# Patient Record
Sex: Female | Born: 1950 | Race: Black or African American | Hispanic: No | Marital: Married | Smoking: Never smoker
Health system: Southern US, Community
[De-identification: ages and names within clinical notes are randomized; demographics above are authoritative.]

## PROBLEM LIST (undated history)

## (undated) DIAGNOSIS — E785 Hyperlipidemia, unspecified: Secondary | ICD-10-CM

## (undated) DIAGNOSIS — I10 Essential (primary) hypertension: Secondary | ICD-10-CM

## (undated) DIAGNOSIS — E119 Type 2 diabetes mellitus without complications: Secondary | ICD-10-CM

## (undated) DIAGNOSIS — C189 Malignant neoplasm of colon, unspecified: Secondary | ICD-10-CM

## (undated) NOTE — *Deleted (*Deleted)
Pharmacy Student Note: For Learning Purposes ONLY. Not an active part of the chart.   Chief Complaint:  Admitted with  COVID, subsequently A. Fib with RVR, found to have perforated duodenal ulcer. Exploratory laparatomy > hemorrhagic shock. AKI and hyperkalemia ensued. Currently intubated.  PMH: DM, HTN, HLD, colon cancer PSH: partial colectomy, colostomy  Admit Complaint:  Anticoagulation: SCDs (hemorrhagic shock)  Infectious Disease -hypothermic over night (19F) > bair hugger  -PNA Cefepime 2 g   MRSA nasal neg --> D/C'd vanc -UA > UTI --> covering for PNA    -UA: cloudy, leukcoytes > WBCs (no nitrites though)  Cardiovascular -cardizem drip and eliquis - BP 100s overnight -MAP goal > 65 -not f/o on exam -IV vasopressin and levophed  Endocrinology: DM SSI   Gastrointestinal / Nutrition: perforated ulcer -TPN > no K in TPN; no LR > enteral  -protonix  Neurology: intubated Propofol, fentanyl, midazolam infusion, Nimbex, dexmedetomidine -pH: 7.244 (acidosis) -pCO2: 44.9 -> normal (high side) -bicarb [id number] ->  Low (metabolic acidosis)   -TGs elevated (propofol) > propofol D/C'd -diphenhydramine not dialyzed on CRRT  Nephrology: oliguric ATN d/t hemorr shock Scr 5.62 10/25 > 0.9 11/9 > incr again to 1.92 after hemorrhagic shock UOP: seemingly none 11/10  BUN 51 Prismasol 2K  Electrolytes K 5.1> 6 > 4.1 ok  (prismasol 2K/2.5)) > monitor tomorrow for low K > K better > 4/2.5 bath now (and this week) K 4.4 Phos is low (2.3)  Calcium 8.6 Albumin 1.3 Corr Cal= ~9.9-10 ok Mag 2.5  Pulmonary: ARDS/COVID PNA -mech vent   Hematology / Oncology -excess bleed after surgery and Hgb dec 6.9 > hemorrhagic shock (pressors, blood transf, mech vent) -Hgb 8.5 -received transf. Last week -vasopressin 0.03 units/min, levophed 13 mcg/min with MAPS in 60-70s  Inpt meds Chlorhexidine  Feeding suppl Hydrocortisone injection 100 mg SSI 0-20 units levemir 25 units  Pantoprazole vasopressin   PTA Medication Issues Albuterol Amlodipine 10 apixaban 2.5 APAP Aspirin 81 Metformin 500 Rosuvastatin 40  Best Practices   Glean Salvo, Festus Aloe PharmD Student

## (undated) NOTE — *Deleted (*Deleted)
1932: Surgery service paged, pt has orders for free water per NG and metoprolol. Surgery no states nothing per tube, orders need clarification. Pt has hx of afib w/ RVR  1940: Dr. Brantley Stage returned page, follow up with med service regarding orders. Pt is to have nothing in the tube.

## (undated) NOTE — *Deleted (*Deleted)
Pharmacy Resident Rounding Note - for learning purposes only, not an active part of the chart   Admit Complaint: COVID+   - perforated ulcer: s/p lysis of adhesions, graham patch placed >> bleeding post-op, tx to ICU for hemorrhagic shock  Anticoagulation - holding AC, final eval = no DVT, on SCDs Infectious Disease - meth R staph epi 1/4 bottles - day 6 Vanc 1g q24h [12mg /kg/d] -tx trough on 11/27 = 17  Cardiovascular  - EF 70-75% NE @ 13>>15 Endocrinology - CBGs <180, levemir 20 QD + 8u q4 + SSI Gastrointestinal / Nutrition - ppi, on TF  Neurology Nephrology - AKI 2/2 hemorrhagic shock, started CRRT on 11/24 for hyperkalemia of 6.8. started on 2/2.5 >> 4/2.5  Aranesp 40 >>60 mcg IV qTues for anemia of AKI * Outpatient ESA/iron orders: none  * Last Tsat 15% and ferritin 1,638 on 11/22 - Feraheme 510mg  x1 on 11/23 * Last doses of Aranesp given on 11/23 * Hgb 8 on 11/29, next dose due 11/30  Phos low 2.3  - replacing -12mmol given 11/28, up to 3.7 >> 2.3 on 11/29  4.5-4.7L UF / day  Pulmonary - extubated then re-intubated 11/16, fever on 11/24 so resumed abx Hematology / Oncology Hgb 9.9 >> 8 Tx 11/26 PTA Medication Issues Best Practices - last BM 11/29   AKI on CKD - cont CRRT 4/2.5 bath - RIJ cath removed, left placed 11/17 - needs TDC - kphos x1 -- K 5.9>>3.9, phos 2.3 >>6.9>> 2.2  Perforated ulcer, anemia  COVID / sepsis  - resp req coming down, FiO2 70 >> 50  - f/u abx stop date

---

## 2006-06-18 ENCOUNTER — Emergency Department (HOSPITAL_COMMUNITY): Admission: EM | Admit: 2006-06-18 | Discharge: 2006-06-19 | Payer: Self-pay | Admitting: Emergency Medicine

## 2015-08-17 ENCOUNTER — Other Ambulatory Visit: Payer: Self-pay | Admitting: Endocrinology

## 2015-08-17 DIAGNOSIS — E041 Nontoxic single thyroid nodule: Secondary | ICD-10-CM

## 2015-08-31 ENCOUNTER — Ambulatory Visit
Admission: RE | Admit: 2015-08-31 | Discharge: 2015-08-31 | Disposition: A | Discharge status: Home / Self Care | Payer: Medicare Other | Source: Ambulatory Visit | Attending: Endocrinology | Admitting: Endocrinology

## 2015-08-31 ENCOUNTER — Other Ambulatory Visit (HOSPITAL_COMMUNITY)
Admission: RE | Admit: 2015-08-31 | Discharge: 2015-08-31 | Disposition: A | Discharge status: Home / Self Care | Payer: Medicare Other | Source: Ambulatory Visit | Attending: Diagnostic Radiology | Admitting: Diagnostic Radiology

## 2015-08-31 DIAGNOSIS — E041 Nontoxic single thyroid nodule: Secondary | ICD-10-CM

## 2020-08-23 ENCOUNTER — Encounter (HOSPITAL_COMMUNITY): Payer: Self-pay | Admitting: Internal Medicine

## 2020-08-23 ENCOUNTER — Inpatient Hospital Stay (HOSPITAL_COMMUNITY)
Admission: EM | Admit: 2020-08-23 | Discharge: 2020-08-29 | DRG: 177 | Disposition: A | Discharge status: Home Health | Payer: Medicare Other | Attending: Internal Medicine | Admitting: Internal Medicine

## 2020-08-23 ENCOUNTER — Emergency Department (HOSPITAL_COMMUNITY): Payer: Medicare Other

## 2020-08-23 ENCOUNTER — Other Ambulatory Visit: Payer: Self-pay

## 2020-08-23 DIAGNOSIS — E119 Type 2 diabetes mellitus without complications: Secondary | ICD-10-CM | POA: Diagnosis present

## 2020-08-23 DIAGNOSIS — R7989 Other specified abnormal findings of blood chemistry: Secondary | ICD-10-CM | POA: Diagnosis present

## 2020-08-23 DIAGNOSIS — I959 Hypotension, unspecified: Secondary | ICD-10-CM | POA: Diagnosis present

## 2020-08-23 DIAGNOSIS — K219 Gastro-esophageal reflux disease without esophagitis: Secondary | ICD-10-CM | POA: Diagnosis present

## 2020-08-23 DIAGNOSIS — J1282 Pneumonia due to coronavirus disease 2019: Secondary | ICD-10-CM | POA: Diagnosis present

## 2020-08-23 DIAGNOSIS — Z8249 Family history of ischemic heart disease and other diseases of the circulatory system: Secondary | ICD-10-CM

## 2020-08-23 DIAGNOSIS — E782 Mixed hyperlipidemia: Secondary | ICD-10-CM | POA: Diagnosis present

## 2020-08-23 DIAGNOSIS — E0520 Thyrotoxicosis with toxic multinodular goiter without thyrotoxic crisis or storm: Secondary | ICD-10-CM | POA: Diagnosis present

## 2020-08-23 DIAGNOSIS — N179 Acute kidney failure, unspecified: Principal | ICD-10-CM

## 2020-08-23 DIAGNOSIS — R0602 Shortness of breath: Secondary | ICD-10-CM

## 2020-08-23 DIAGNOSIS — Z902 Acquired absence of lung [part of]: Secondary | ICD-10-CM

## 2020-08-23 DIAGNOSIS — Z79899 Other long term (current) drug therapy: Secondary | ICD-10-CM | POA: Diagnosis not present

## 2020-08-23 DIAGNOSIS — E872 Acidosis: Secondary | ICD-10-CM | POA: Diagnosis present

## 2020-08-23 DIAGNOSIS — Z7982 Long term (current) use of aspirin: Secondary | ICD-10-CM

## 2020-08-23 DIAGNOSIS — Z85118 Personal history of other malignant neoplasm of bronchus and lung: Secondary | ICD-10-CM | POA: Diagnosis not present

## 2020-08-23 DIAGNOSIS — Z7984 Long term (current) use of oral hypoglycemic drugs: Secondary | ICD-10-CM | POA: Diagnosis not present

## 2020-08-23 DIAGNOSIS — E871 Hypo-osmolality and hyponatremia: Secondary | ICD-10-CM | POA: Diagnosis present

## 2020-08-23 DIAGNOSIS — Z85048 Personal history of other malignant neoplasm of rectum, rectosigmoid junction, and anus: Secondary | ICD-10-CM

## 2020-08-23 DIAGNOSIS — Z2809 Immunization not carried out because of other contraindication: Secondary | ICD-10-CM

## 2020-08-23 DIAGNOSIS — U071 COVID-19: Principal | ICD-10-CM | POA: Diagnosis present

## 2020-08-23 DIAGNOSIS — J9601 Acute respiratory failure with hypoxia: Secondary | ICD-10-CM | POA: Diagnosis present

## 2020-08-23 DIAGNOSIS — I10 Essential (primary) hypertension: Secondary | ICD-10-CM | POA: Diagnosis present

## 2020-08-23 DIAGNOSIS — E860 Dehydration: Secondary | ICD-10-CM | POA: Diagnosis present

## 2020-08-23 HISTORY — DX: Hyperlipidemia, unspecified: E78.5

## 2020-08-23 HISTORY — DX: Essential (primary) hypertension: I10

## 2020-08-23 HISTORY — DX: Type 2 diabetes mellitus without complications: E11.9

## 2020-08-23 LAB — CBC WITH DIFFERENTIAL/PLATELET
Abs Immature Granulocytes: 0.04 10*3/uL (ref 0.00–0.07)
Basophils Absolute: 0 10*3/uL (ref 0.0–0.1)
Basophils Relative: 0 %
Eosinophils Absolute: 0 10*3/uL (ref 0.0–0.5)
Eosinophils Relative: 0 %
HCT: 47.5 % — ABNORMAL HIGH (ref 36.0–46.0)
Hemoglobin: 14.5 g/dL (ref 12.0–15.0)
Immature Granulocytes: 1 %
Lymphocytes Relative: 8 %
Lymphs Abs: 0.5 10*3/uL — ABNORMAL LOW (ref 0.7–4.0)
MCH: 22.6 pg — ABNORMAL LOW (ref 26.0–34.0)
MCHC: 30.5 g/dL (ref 30.0–36.0)
MCV: 73.9 fL — ABNORMAL LOW (ref 80.0–100.0)
Monocytes Absolute: 0.9 10*3/uL (ref 0.1–1.0)
Monocytes Relative: 16 %
Neutro Abs: 4.4 10*3/uL (ref 1.7–7.7)
Neutrophils Relative %: 75 %
Platelets: 261 10*3/uL (ref 150–400)
RBC: 6.43 MIL/uL — ABNORMAL HIGH (ref 3.87–5.11)
RDW: 16.1 % — ABNORMAL HIGH (ref 11.5–15.5)
WBC: 5.9 10*3/uL (ref 4.0–10.5)
nRBC: 0 % (ref 0.0–0.2)

## 2020-08-23 LAB — COMPREHENSIVE METABOLIC PANEL
ALT: 26 U/L (ref 0–44)
AST: 51 U/L — ABNORMAL HIGH (ref 15–41)
Albumin: 3.6 g/dL (ref 3.5–5.0)
Alkaline Phosphatase: 66 U/L (ref 38–126)
Anion gap: 19 — ABNORMAL HIGH (ref 5–15)
BUN: 70 mg/dL — ABNORMAL HIGH (ref 8–23)
CO2: 18 mmol/L — ABNORMAL LOW (ref 22–32)
Calcium: 10.2 mg/dL (ref 8.9–10.3)
Chloride: 94 mmol/L — ABNORMAL LOW (ref 98–111)
Creatinine, Ser: 5.62 mg/dL — ABNORMAL HIGH (ref 0.44–1.00)
GFR, Estimated: 8 mL/min — ABNORMAL LOW (ref >60)
Glucose, Bld: 171 mg/dL — ABNORMAL HIGH (ref 70–99)
Potassium: 4.4 mmol/L (ref 3.5–5.1)
Sodium: 131 mmol/L — ABNORMAL LOW (ref 135–145)
Total Bilirubin: 0.7 mg/dL (ref 0.3–1.2)
Total Protein: 8.6 g/dL — ABNORMAL HIGH (ref 6.5–8.1)

## 2020-08-23 LAB — D-DIMER, QUANTITATIVE: D-Dimer, Quant: 1.44 ug/mL-FEU — ABNORMAL HIGH (ref 0.00–0.50)

## 2020-08-23 LAB — RESP PANEL BY RT PCR (RSV, FLU A&B, COVID)
Influenza A by PCR: NEGATIVE
Influenza B by PCR: NEGATIVE
Respiratory Syncytial Virus by PCR: NEGATIVE
SARS Coronavirus 2 by RT PCR: POSITIVE — AB

## 2020-08-23 LAB — LACTATE DEHYDROGENASE: LDH: 325 U/L — ABNORMAL HIGH (ref 98–192)

## 2020-08-23 LAB — LACTIC ACID, PLASMA
Lactic Acid, Venous: 3.3 mmol/L (ref 0.5–1.9)
Lactic Acid, Venous: 1.1 mmol/L (ref 0.5–1.9)

## 2020-08-23 LAB — TRIGLYCERIDES: Triglycerides: 239 mg/dL — ABNORMAL HIGH (ref <150)

## 2020-08-23 LAB — FIBRINOGEN: Fibrinogen: 755 mg/dL — ABNORMAL HIGH (ref 210–475)

## 2020-08-23 LAB — CBG MONITORING, ED: Glucose-Capillary: 127 mg/dL — ABNORMAL HIGH (ref 70–99)

## 2020-08-23 LAB — PROCALCITONIN: Procalcitonin: 4.83 ng/mL

## 2020-08-23 LAB — GLUCOSE, CAPILLARY: Glucose-Capillary: 220 mg/dL — ABNORMAL HIGH (ref 70–99)

## 2020-08-23 MED ORDER — SODIUM CHLORIDE 0.9 % IV BOLUS
1000.0000 mL | Freq: Once | INTRAVENOUS | Status: AC
  Administered 2020-08-23: 1000 mL via INTRAVENOUS

## 2020-08-23 MED ORDER — ROSUVASTATIN CALCIUM 20 MG PO TABS
40.0000 mg | ORAL_TABLET | Freq: Every day | ORAL | Status: DC
  Administered 2020-08-23 – 2020-08-29 (×7): 40 mg via ORAL
  Filled 2020-08-23: qty 8
  Filled 2020-08-23 (×6): qty 2

## 2020-08-23 MED ORDER — ACETAMINOPHEN 500 MG PO TABS: 1000.0000 mg | ORAL_TABLET | Freq: Four times a day (QID) | ORAL | Status: DC | PRN

## 2020-08-23 MED ORDER — HEPARIN SODIUM (PORCINE) 5000 UNIT/ML IJ SOLN
5000.0000 [IU] | Freq: Three times a day (TID) | INTRAMUSCULAR | Status: DC
  Administered 2020-08-23 – 2020-08-25 (×6): 5000 [IU] via SUBCUTANEOUS
  Filled 2020-08-23 (×6): qty 1

## 2020-08-23 MED ORDER — ONDANSETRON HCL 4 MG/2ML IJ SOLN: 4.0000 mg | Freq: Four times a day (QID) | INTRAMUSCULAR | Status: DC | PRN

## 2020-08-23 MED ORDER — ALBUTEROL SULFATE HFA 108 (90 BASE) MCG/ACT IN AERS
2.0000 | INHALATION_SPRAY | RESPIRATORY_TRACT | Status: DC | PRN
  Filled 2020-08-23: qty 6.7

## 2020-08-23 MED ORDER — ONDANSETRON HCL 4 MG PO TABS: 4.0000 mg | ORAL_TABLET | Freq: Four times a day (QID) | ORAL | Status: DC | PRN

## 2020-08-23 MED ORDER — SODIUM CHLORIDE 0.9 % IV SOLN
100.0000 mg | Freq: Every day | INTRAVENOUS | Status: AC
  Administered 2020-08-24 – 2020-08-27 (×4): 100 mg via INTRAVENOUS
  Filled 2020-08-23 (×4): qty 20

## 2020-08-23 MED ORDER — SODIUM CHLORIDE 0.9 % IV SOLN
200.0000 mg | Freq: Once | INTRAVENOUS | Status: AC
  Administered 2020-08-23: 200 mg via INTRAVENOUS
  Filled 2020-08-23: qty 40

## 2020-08-23 MED ORDER — METHYLPREDNISOLONE SODIUM SUCC 125 MG IJ SOLR
0.5000 mg/kg | Freq: Two times a day (BID) | INTRAMUSCULAR | Status: DC
  Administered 2020-08-23 – 2020-08-24 (×2): 45 mg via INTRAVENOUS
  Filled 2020-08-23 (×2): qty 2

## 2020-08-23 MED ORDER — SODIUM CHLORIDE 0.9 % IV SOLN: INTRAVENOUS | Status: DC

## 2020-08-23 MED ORDER — INSULIN ASPART 100 UNIT/ML ~~LOC~~ SOLN
0.0000 [IU] | Freq: Three times a day (TID) | SUBCUTANEOUS | Status: DC
  Administered 2020-08-23: 1 [IU] via SUBCUTANEOUS
  Administered 2020-08-24: 5 [IU] via SUBCUTANEOUS
  Administered 2020-08-24: 2 [IU] via SUBCUTANEOUS
  Administered 2020-08-24: 5 [IU] via SUBCUTANEOUS
  Administered 2020-08-25 (×2): 7 [IU] via SUBCUTANEOUS
  Administered 2020-08-25 – 2020-08-26 (×2): 5 [IU] via SUBCUTANEOUS
  Administered 2020-08-26: 7 [IU] via SUBCUTANEOUS
  Administered 2020-08-26: 5 [IU] via SUBCUTANEOUS
  Administered 2020-08-27: 1 [IU] via SUBCUTANEOUS
  Administered 2020-08-27 (×2): 5 [IU] via SUBCUTANEOUS
  Administered 2020-08-28: 2 [IU] via SUBCUTANEOUS
  Administered 2020-08-28: 5 [IU] via SUBCUTANEOUS
  Administered 2020-08-28 – 2020-08-29 (×3): 3 [IU] via SUBCUTANEOUS

## 2020-08-23 MED ORDER — SODIUM CHLORIDE 0.9 % IV SOLN: 100.0000 mg | Freq: Every day | INTRAVENOUS | Status: DC

## 2020-08-23 MED ORDER — SODIUM CHLORIDE 0.9 % IV SOLN: 200.0000 mg | Freq: Once | INTRAVENOUS | Status: DC

## 2020-08-23 MED ORDER — HYDROCOD POLST-CPM POLST ER 10-8 MG/5ML PO SUER: 5.0000 mL | Freq: Two times a day (BID) | ORAL | Status: DC | PRN

## 2020-08-23 MED ORDER — PREDNISONE 20 MG PO TABS: 50.0000 mg | ORAL_TABLET | Freq: Every day | ORAL | Status: DC

## 2020-08-23 MED ORDER — GUAIFENESIN-DM 100-10 MG/5ML PO SYRP: 10.0000 mL | ORAL_SOLUTION | ORAL | Status: DC | PRN

## 2020-08-23 MED ORDER — ASPIRIN EC 81 MG PO TBEC
81.0000 mg | DELAYED_RELEASE_TABLET | Freq: Every day | ORAL | Status: DC
  Administered 2020-08-23 – 2020-08-29 (×7): 81 mg via ORAL
  Filled 2020-08-23 (×7): qty 1

## 2020-08-23 MED ORDER — LINAGLIPTIN 5 MG PO TABS
5.0000 mg | ORAL_TABLET | Freq: Every day | ORAL | Status: DC
  Administered 2020-08-23 – 2020-08-29 (×7): 5 mg via ORAL
  Filled 2020-08-23 (×7): qty 1

## 2020-08-23 NOTE — Progress Notes (Signed)
   08/23/20 1947  Assess: MEWS Score  Temp 98.8 F (37.1 C)  BP 100/67  Pulse Rate 97  ECG Heart Rate 98  Resp (!) 29  Level of Consciousness Alert  SpO2 93 %  O2 Device Nasal Cannula  Patient Activity (if Appropriate) In bed  O2 Flow Rate (L/min) 2 L/min  Assess: MEWS Score  MEWS Temp 0  MEWS Systolic 1  MEWS Pulse 0  MEWS RR 2  MEWS LOC 0  MEWS Score 3  MEWS Score Color Yellow  Assess: if the MEWS score is Yellow or Red  Were vital signs taken at a resting state? Yes  Focused Assessment No change from prior assessment  Early Detection of Sepsis Score *See Row Information* High  MEWS guidelines implemented *See Row Information* No, previously yellow, continue vital signs every 4 hours  Treat  MEWS Interventions Escalated (See documentation below)  Pain Scale 0-10  Pain Score 2  Pain Type Acute pain  Pain Location Generalized  Pain Descriptors / Indicators Aching  Escalate  MEWS: Escalate Yellow: discuss with charge nurse/RN and consider discussing with provider and RRT  Notify: Charge Nurse/RN  Name of Charge Nurse/RN Notified Vicente Males   Date Charge Nurse/RN Notified 08/23/20  Time Charge Nurse/RN Notified 1947

## 2020-08-23 NOTE — H&P (Addendum)
History and Physical    Candace [name] TDD:[id number] DOB: 1951-01-18 DOA: 08/23/2020  I have briefly reviewed the patient's prior medical records in [facility]  PCP: Patient, No Pcp Per  Patient coming from: Urgent care  Chief Complaint: Weakness, low blood pressure  HPI: Candace [name] is a 69 y.o. female with medical history significant of hypertension, hyperlipidemia, diabetes mellitus, who comes to the hospital from urgent care due to hypotension as well as weakness.  Patient has been having generalized weakness, intermittent fever and chills for the past 7 to 8 days, she went to an urgent care on 10/21 when she tested positive for Covid.  She was scheduled to go today for antibody infusion, however when she arrived there she was found to be hypotensive, lethargic, very weak and was sent to the hospital.  Patient reports that she was not vaccinated for COVID-19.  Multiple family members are sick.  Currently she is feeling very very weak, denies any shortness of breath, denies any cough.  She reports intermittent fevers, poor p.o. intake over the last 7 days barely eating and drinking anything.  No abdominal pain, no nausea, vomiting or diarrhea.  During my interview, patient oxygen saturation dips into the upper 80s  ED Course: In the ED she is afebrile 98.6, she is tachypneic with respiratory rate in the upper 20s, initial blood pressure was 85/42.  Blood work is pertinent for BUN of 17 creatinine 5.6, glucose 171, sodium 131.  Lactic acid was 3.3.  Procalcitonin was 4.8.  CBC shows a hemoglobin of 14.5.  Chest x-ray shows multifocal pneumonia.  We are asked to admit for acute kidney injury    Review of Systems: All systems reviewed, and apart from HPI, all negative  Past medical history Essential hypertension Diabetes mellitus Hyperlipidemia   Denies alcohol  No Known Allergies  Family history reviewed and noncontributory  Prior to Admission medications   Medication Sig  Start Date End Date Taking? Authorizing Provider  acetaminophen (TYLENOL) 500 MG tablet Take 1,000 mg by mouth every 6 (six) hours as needed for mild pain.   Yes [provider]  aspirin 81 MG EC tablet Take 81 mg by mouth daily. 06/20/18  Yes [provider]  lisinopril-hydrochlorothiazide (ZESTORETIC) 20-25 MG tablet Take 1 tablet by mouth daily. 07/29/20  Yes [provider]  metFORMIN (GLUCOPHAGE-XR) 500 MG 24 hr tablet Take 500 mg by mouth daily. 07/16/20  Yes [provider]  methylPREDNISolone (MEDROL DOSEPAK) 4 MG TBPK tablet Take 4 mg by mouth as directed. 08/19/20  Yes [provider]  rosuvastatin (CRESTOR) 40 MG tablet Take 40 mg by mouth daily. 06/22/20  Yes [provider]  zinc gluconate 50 MG tablet Take 50 mg by mouth daily.   Yes [provider]    Physical Exam: Vitals:   08/23/20 1307 08/23/20 1315 08/23/20 1330 08/23/20 1345  BP: 103/62 91/60 105/66 (!) 116/59  Pulse: 97 92 97 93  Resp: 18 (!) 21 (!) 31 (!) 24  Temp: 98.6 F (37 C)     TempSrc: Oral     SpO2: 92% (!) 89% 92% 94%   Constitutional: Appears comfortable, visibly tachypneic Eyes: No scleral icterus ENMT: Mucous membranes are dry Neck: normal, supple Respiratory: Lungs are overall clear, no wheezing or crackles heard.  Tachypneic Cardiovascular: Regular rate and rhythm, no murmurs / rubs / gallops. No extremity edema.   Abdomen: no tenderness, no masses palpated. Bowel sounds positive.  Musculoskeletal: no clubbing / cyanosis.  Normal muscle tone.  Skin: no rashes, lesions, ulcers. No induration Neurologic: CN 2-12 grossly intact. Strength 5/5 in all 4.  Psychiatric: Normal judgment and insight. Alert and oriented x 3. Normal mood.   Labs on Admission: I have personally reviewed following labs and imaging studies  CBC: Recent Labs  Lab 08/23/20 1205  WBC 5.9  NEUTROABS 4.4  HGB 14.5  HCT 47.5*  MCV 73.9*  PLT 998   Basic Metabolic  Panel: Recent Labs  Lab 08/23/20 1205  NA 131*  K 4.4  CL 94*  CO2 18*  GLUCOSE 171*  BUN 70*  CREATININE 5.62*  CALCIUM 10.2   Liver Function Tests: Recent Labs  Lab 08/23/20 1205  AST 51*  ALT 26  ALKPHOS 66  BILITOT 0.7  PROT 8.6*  ALBUMIN 3.6   Coagulation Profile: No results for input(s): INR, PROTIME in the last 168 hours. BNP (last 3 results) No results for input(s): PROBNP in the last 8760 hours. CBG: No results for input(s): GLUCAP in the last 168 hours. Thyroid Function Tests: No results for input(s): TSH, T4TOTAL, FREET4, T3FREE, THYROIDAB in the last 72 hours. Urine analysis: No results found for: COLORURINE, APPEARANCEUR, LABSPEC, [place], GLUCOSEU, HGBUR, BILIRUBINUR, KETONESUR, PROTEINUR, UROBILINOGEN, NITRITE, LEUKOCYTESUR   Radiological Exams on Admission: DG Chest Port 1 View  Result Date: 08/23/2020 CLINICAL DATA:  Shortness of breath EXAM: PORTABLE CHEST 1 VIEW COMPARISON:  None. FINDINGS: There is ill-defined airspace opacity in portions of each mid and lower lung region. Heart size and pulmonary vascularity are normal. No adenopathy. There is degenerative change in the left shoulder. IMPRESSION: Ill-defined airspace opacity in the mid and lower lung regions, most indicative of multifocal pneumonia. Question atypical organism pneumonia given this appearance. Check of COVID-19 status in this regard advised. Heart size within normal limits.  No adenopathy appreciable. Electronically Signed   By: Lowella Grip III M.D.   On: 08/23/2020 11:33    EKG: Independently reviewed.  Sinus tachycardia  Assessment/Plan  Principal Problem Acute hypoxic respiratory failure due to COVID-19 pneumonia -Patient oxygen saturation dips into the upper 80s when she drops, she is visibly tachypneic chest x-ray shows multifocal pneumonia.  Subjectively she denies shortness of breath, however given objective respiratory findings for now would favor remdesivir, steroids  rather than monoclonal antibodies  Active Problems Acute kidney injury -Due to poor p.o. intake, lisinopril HCTZ at home.  Hold ACE/diuretics, was already bolused 2 L normal saline, will place on continuous IV fluids overnight and repeat renal function in the morning.  Essential hypertension -Currently hypotensive, give IV fluid, hold home regimen  Type 2 diabetes mellitus -Hold Metformin, placed on sliding scale insulin  Hyperlipidemia -Continue statin  Hyponatremia -She is clinically dehydrated, repeat in the morning after fluids   DVT prophylaxis: heparin  Code Status: Full code  Family Communication: d/w patient  Disposition Plan: home when ready  Bed Type: medsurg Consults called: none  Obs/Inp: inpatient   At the time of admission, it appears that the appropriate admission status for this patient is INPATIENT as it is expected that patient will require hospital care > 2 midnights. This is judged to be reasonable and necessary in order to provide the required intensity of service to ensure the patient's safety given: presenting symptoms, initial radiographic and laboratory data and in the context of their chronic comorbidities. Together, these circumstances are felt to place patient at high at high risk for further clinical deterioration threatening life, limb, or organ.  Marzetta Board, MD,  PhD [facility]  Contact via [email or web address]  08/23/2020, 2:21 PM

## 2020-08-23 NOTE — ED Provider Notes (Signed)
[place] EMERGENCY DEPARTMENT Provider Note   CSN: [id number] Arrival date & time: 08/23/20  1102     History No chief complaint on file.   Candace [name] is a 69 y.o. female sent in from [facility] urgent care due to lethargy and hypotension.  The patient was diagnosed with COVID-19 on 08/19/2020 and had onset of symptoms on 08/14/2020.  According to the EMR she presented at [facility] urgent care on [address] today to receive the monoclonal antibody infusion however she arrived hypotensive with a blood pressure of 87/60 but states that she has not been able to eat or drink very much over the past few days.  She was sent here for further evaluation and management.  HPI Histories below are copied from most recent outside records.    Current problem list . COVID-19 08/19/2020  . 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 Exposure to confirmed case of COVID-19 06/04/2020  . Family history of premature coronary artery disease 10/15/2018  . Mixed hyperlipidemia 04/11/2018  . 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 Menopause 04/17/2017  . Screening for breast cancer 03/16/2017  . Screening for osteoporosis 03/16/2017  . Anemia, iron deficiency 03/15/2017  . Colostomy care ([place]) 03/15/2017  . Type 2 diabetes mellitus without complication, without long-term current use of insulin ([place]) 03/15/2017  . 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 GERD (gastroesophageal reflux disease) 03/14/2017  . Benign hypertension 03/14/2017  . Hyperthyroidism 09/14/2016  . Multinodular goiter 09/14/2016  . 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 aspirin 81 MG EC tablet *ANTIPLATELET*, Take 1 tablet (81 mg total) by mouth daily., Disp: , Rfl:  . blood sugar diagnostic Strp, 1 strip by Misc.(Non-Drug; Combo Route) route daily., Disp: 100 strip, Rfl: 3 . lancets 33 gauge Misc, 1  Units by Misc.(Non-Drug; Combo Route) route daily., Disp: 100 each, Rfl: 3 . lancets Misc, , Disp: , Rfl:  . lisinopriL-hydrochlorothiazide (PRINZIDE,ZESTORETIC) 20-25 mg per tablet, TAKE 1 TABLET BY MOUTH EVERY MORNING FOR HIGH BLOOD PRESSURE, Disp: 90 tablet, Rfl: 3 . metFORMIN ER (GLUCOPHAGE-XR) 500 MG extended release tablet, Take 1 tablet (500 mg total) by mouth daily with breakfast. diabetes, Disp: , Rfl:  . ONETOUCH VERIO TEST STRIPS Strp, , Disp: , Rfl:  . rosuvastatin (CRESTOR) 40 MG tablet, Take 1 tablet (40 mg total) by mouth daily. cholesterol, Disp: 90 tablet, Rfl: 3   Past Surgical History:  Procedure Laterality Date  . ABDOMINAL PERINEAL BOWEL RESECTION W/ ILEOANAL POUCH N/A 2012  . COLECTOMY N/A 2015  recurrent cancer  . LUNG LOBECTOMY Left 2014  metastatic cancer from anus  . OVARIAN CYST REMOVAL Left 1981  . TUBAL LIGATION Bilateral 1985    OB History   No obstetric history on file.     No family history on file.  Social History   Tobacco Use  . Smoking status: Not on file  Substance Use Topics  . Alcohol use: Not on file  . Drug use: Not on file    Home Medications Prior to Admission medications   Not on File    Allergies    Patient has no allergy information on record.  Review of Systems   Review of Systems .Ten systems reviewed and are negative for acute change, except as noted in the HPI.   Physical Exam Updated Vital Signs BP (!) 77/53   Pulse (!) 103  Temp 98.6 F (37 C)   Resp 18   SpO2 92%   Physical Exam Vitals and nursing note reviewed.  Constitutional:      General: She is not in acute distress.    Appearance: She is well-developed. She is ill-appearing. She is not diaphoretic.     Comments: Patient appears weak  HENT:     Head: Normocephalic and atraumatic.  Eyes:     General: No scleral icterus.    Conjunctiva/sclera: Conjunctivae normal.  Cardiovascular:     Rate and Rhythm: Normal rate and regular rhythm.     Heart  sounds: Normal heart sounds. No murmur heard.  No friction rub. No gallop.   Pulmonary:     Effort: Pulmonary effort is normal. No respiratory distress.     Breath sounds: Normal breath sounds.  Abdominal:     General: Bowel sounds are normal. There is no distension.     Palpations: Abdomen is soft. There is no mass.     Tenderness: There is no abdominal tenderness. There is no guarding.  Musculoskeletal:     Cervical back: Normal range of motion.  Skin:    General: Skin is warm and dry.  Neurological:     Mental Status: She is alert and oriented to person, place, and time.  Psychiatric:        Behavior: Behavior normal.     ED Results / Procedures / Treatments   Labs (all labs ordered are listed, but only abnormal results are displayed) Labs Reviewed - No data to display  EKG None  Radiology No results found.  Procedures .Critical Care Performed by: Margarita Mail, PA-C Authorized by: Margarita Mail, PA-C   Critical care provider statement:    Critical care time (minutes):  45   Critical care time was exclusive of:  Separately billable procedures and treating other patients   Critical care was necessary to treat or prevent imminent or life-threatening deterioration of the following conditions:  Dehydration and renal failure   Critical care was time spent personally by me on the following activities:  Discussions with consultants, evaluation of patient's response to treatment, examination of patient, ordering and performing treatments and interventions, ordering and review of laboratory studies, ordering and review of radiographic studies, pulse oximetry, re-evaluation of patient's condition, obtaining history from patient or surrogate and review of old charts   (including critical care time)  Medications Ordered in ED Medications  sodium chloride 0.9 % bolus 1,000 mL (has no administration in time range)    ED Course  I have reviewed the triage vital signs and the  nursing notes.  Pertinent labs & imaging results that were available during my care of the patient were reviewed by me and considered in my medical decision making (see chart for details).  Clinical Course as of Aug 23 1354  Mon Aug 23, 2020  1317 Lactic Acid, Venous(!!): 3.3 [AH]    Clinical Course User Index [AH] Margarita Mail, PA-C   MDM Rules/Calculators/A&P                          [id number] VS:  Vitals:   08/23/20 1800 08/23/20 1849 08/23/20 1906 08/23/20 1947  BP: 110/62 98/61  100/67  Pulse: 96 98 99 97  Resp: (!) 28 16 (!) 27 (!) 29  Temp:    98.8 F (37.1 C)  TempSrc:    Oral  SpO2: 91% (!) 89% 92% 93%  Weight:  Height:        OE:[id number] is gathered by patient and outside records. Previous records obtained and reviewed. DDX:The patient's complaint of weakness involves an extensive number of diagnostic and treatment options, and is a complaint that carries with it a high risk of complications, morbidity, and potential mortality. Given the large differential diagnosis, medical decision making is of high complexity. The differential diagnosis of weakness includes but is not limited to neurologic causes (GBS, myasthenia gravis, CVA, MS, ALS, transverse myelitis, spinal cord injury, CVA, botulism, ) and other causes: ACS, Arrhythmia, syncope, orthostatic hypotension, sepsis, hypoglycemia, electrolyte disturbance, hypothyroidism, respiratory failure, symptomatic anemia, dehydration, heat injury, polypharmacy, malignancy.  Labs: I ordered reviewed and interpreted labs which include CBC without elevated white blood cell count, lymphopenia, CMP with hyponatremia, elevated glucose and severe acute kidney injury with creatinine of 5.6 and BUN of 70, anion gap of 19 likely multifactorial due to lactic acidosis as well as elevated BUN level.  Patient's inflammatory markers are elevated including fibrinogen, triglycerides, D-dimer and lactate dehydrogenase.  Procalcitonin is  within normal limits.  Remainder of the patient's labs are currently pending Imaging: I ordered and reviewed images which included portable 1 view chest x-ray. I independently visualized and interpreted all imaging. Significant findings include faint airspace opacities indicative of multifocal pneumonia.  EKG: Sinus tachycardia at a rate of 103 Consults: MDM: Patient here with severe fatigue and weakness, known Covid positive patient.  Work-up shows that the patient has severe dehydration with marked kidney injury going from a previous creatinine of 1.56 up to 5-1/2 today.  Patient getting aggressive fluid rehydration, I believe the patient's lactic acidosis is all secondary to her massive dehydration.  She will be admitted to the hospitalist service.  Case discussed with Dr. Roosevelt Locks Patient disposition:The patient appears reasonably stabilized for admission considering the current resources, flow, and capabilities available in the ED at this time, and I doubt any other [facility] requiring further screening and/or treatment in the ED prior to admission.   Candace [name] was evaluated in Emergency Department on 08/23/2020 for the symptoms described in the history of present illness. She was evaluated in the context of the global COVID-19 pandemic, which necessitated consideration that the patient might be at risk for infection with the SARS-CoV-2 virus that causes COVID-19. Institutional protocols and algorithms that pertain to the evaluation of patients at risk for COVID-19 are in a state of rapid change based on information released by regulatory bodies including the CDC and federal and state organizations. These policies and algorithms were followed during the patient's care in the ED.     Candace [name] was evaluated in Emergency Department on 08/23/2020 for the symptoms described in the history of present illness. She was evaluated in the context of the global COVID-19 pandemic, which necessitated consideration  that the patient might be at risk for infection with the SARS-CoV-2 virus that causes COVID-19. Institutional protocols and algorithms that pertain to the evaluation of patients at risk for COVID-19 are in a state of rapid change based on information released by regulatory bodies including the CDC and federal and state organizations. These policies and algorithms were followed during the patient's care in the ED.  Final Clinical Impression(s) / ED Diagnoses Final diagnoses:  None    Rx / DC Orders ED Discharge Orders    None       Margarita Mail, PA-C 08/23/20 2040    Sherwood Gambler, MD 08/24/20 1200

## 2020-08-23 NOTE — ED Triage Notes (Signed)
Pt here from [facility] with c/o low b/p . Pt was at [facility] to get the antibody infusion

## 2020-08-24 ENCOUNTER — Encounter (HOSPITAL_COMMUNITY): Payer: Self-pay | Admitting: Internal Medicine

## 2020-08-24 ENCOUNTER — Inpatient Hospital Stay (HOSPITAL_COMMUNITY): Payer: Medicare Other

## 2020-08-24 ENCOUNTER — Other Ambulatory Visit: Payer: Self-pay

## 2020-08-24 DIAGNOSIS — U071 COVID-19: Secondary | ICD-10-CM | POA: Diagnosis not present

## 2020-08-24 DIAGNOSIS — R7989 Other specified abnormal findings of blood chemistry: Secondary | ICD-10-CM

## 2020-08-24 LAB — CREATININE, URINE, RANDOM: Creatinine, Urine: 128.11 mg/dL

## 2020-08-24 LAB — COMPREHENSIVE METABOLIC PANEL
ALT: 21 U/L (ref 0–44)
AST: 43 U/L — ABNORMAL HIGH (ref 15–41)
Albumin: 2.9 g/dL — ABNORMAL LOW (ref 3.5–5.0)
Alkaline Phosphatase: 57 U/L (ref 38–126)
Anion gap: 15 (ref 5–15)
BUN: 80 mg/dL — ABNORMAL HIGH (ref 8–23)
CO2: 14 mmol/L — ABNORMAL LOW (ref 22–32)
Calcium: 9.1 mg/dL (ref 8.9–10.3)
Chloride: 105 mmol/L (ref 98–111)
Creatinine, Ser: 3.63 mg/dL — ABNORMAL HIGH (ref 0.44–1.00)
GFR, Estimated: 13 mL/min — ABNORMAL LOW (ref >60)
Glucose, Bld: 216 mg/dL — ABNORMAL HIGH (ref 70–99)
Potassium: 5.4 mmol/L — ABNORMAL HIGH (ref 3.5–5.1)
Sodium: 134 mmol/L — ABNORMAL LOW (ref 135–145)
Total Bilirubin: 0.5 mg/dL (ref 0.3–1.2)
Total Protein: 6.9 g/dL (ref 6.5–8.1)

## 2020-08-24 LAB — URINALYSIS, ROUTINE W REFLEX MICROSCOPIC
Bilirubin Urine: NEGATIVE
Glucose, UA: NEGATIVE mg/dL
Ketones, ur: NEGATIVE mg/dL
Nitrite: NEGATIVE
Protein, ur: 30 mg/dL — AB
RBC / HPF: >50 RBC/hpf — ABNORMAL HIGH (ref 0–5)
Specific Gravity, Urine: 1.012 (ref 1.005–1.030)
WBC, UA: >50 WBC/hpf — ABNORMAL HIGH (ref 0–5)
pH: 5 (ref 5.0–8.0)

## 2020-08-24 LAB — C-REACTIVE PROTEIN: CRP: 8.7 mg/dL — ABNORMAL HIGH (ref <1.0)

## 2020-08-24 LAB — CBC WITH DIFFERENTIAL/PLATELET
Abs Immature Granulocytes: 0.04 10*3/uL (ref 0.00–0.07)
Basophils Absolute: 0 10*3/uL (ref 0.0–0.1)
Basophils Relative: 0 %
Eosinophils Absolute: 0 10*3/uL (ref 0.0–0.5)
Eosinophils Relative: 0 %
HCT: 39.8 % (ref 36.0–46.0)
Hemoglobin: 12.6 g/dL (ref 12.0–15.0)
Immature Granulocytes: 1 %
Lymphocytes Relative: 6 %
Lymphs Abs: 0.3 10*3/uL — ABNORMAL LOW (ref 0.7–4.0)
MCH: 23 pg — ABNORMAL LOW (ref 26.0–34.0)
MCHC: 31.7 g/dL (ref 30.0–36.0)
MCV: 72.5 fL — ABNORMAL LOW (ref 80.0–100.0)
Monocytes Absolute: 0.3 10*3/uL (ref 0.1–1.0)
Monocytes Relative: 5 %
Neutro Abs: 5.3 10*3/uL (ref 1.7–7.7)
Neutrophils Relative %: 88 %
Platelets: 211 10*3/uL (ref 150–400)
RBC: 5.49 MIL/uL — ABNORMAL HIGH (ref 3.87–5.11)
RDW: 15.2 % (ref 11.5–15.5)
WBC: 5.9 10*3/uL (ref 4.0–10.5)
nRBC: 0 % (ref 0.0–0.2)

## 2020-08-24 LAB — MRSA PCR SCREENING: MRSA by PCR: NEGATIVE

## 2020-08-24 LAB — GLUCOSE, CAPILLARY
Glucose-Capillary: 177 mg/dL — ABNORMAL HIGH (ref 70–99)
Glucose-Capillary: 299 mg/dL — ABNORMAL HIGH (ref 70–99)
Glucose-Capillary: 264 mg/dL — ABNORMAL HIGH (ref 70–99)
Glucose-Capillary: 200 mg/dL — ABNORMAL HIGH (ref 70–99)

## 2020-08-24 LAB — OSMOLALITY, URINE: Osmolality, Ur: 366 mOsm/kg (ref 300–900)

## 2020-08-24 LAB — PROCALCITONIN: Procalcitonin: 2.28 ng/mL

## 2020-08-24 LAB — URIC ACID: Uric Acid, Serum: 11.9 mg/dL — ABNORMAL HIGH (ref 2.5–7.1)

## 2020-08-24 LAB — FERRITIN: Ferritin: 1600 ng/mL — ABNORMAL HIGH (ref 11–307)

## 2020-08-24 LAB — OSMOLALITY: Osmolality: 327 mOsm/kg (ref 275–295)

## 2020-08-24 LAB — HIV ANTIBODY (ROUTINE TESTING W REFLEX): HIV Screen 4th Generation wRfx: NONREACTIVE

## 2020-08-24 LAB — SODIUM, URINE, RANDOM: Sodium, Ur: 41 mmol/L

## 2020-08-24 LAB — D-DIMER, QUANTITATIVE: D-Dimer, Quant: 1.51 ug/mL-FEU — ABNORMAL HIGH (ref 0.00–0.50)

## 2020-08-24 LAB — MAGNESIUM: Magnesium: 2.3 mg/dL (ref 1.7–2.4)

## 2020-08-24 MED ORDER — METHYLPREDNISOLONE SODIUM SUCC 125 MG IJ SOLR
60.0000 mg | Freq: Two times a day (BID) | INTRAMUSCULAR | Status: DC
  Administered 2020-08-24 – 2020-08-26 (×4): 60 mg via INTRAVENOUS
  Filled 2020-08-24 (×4): qty 2

## 2020-08-24 MED ORDER — SODIUM POLYSTYRENE SULFONATE 15 GM/60ML PO SUSP
30.0000 g | Freq: Once | ORAL | Status: AC
  Administered 2020-08-24: 30 g via ORAL
  Filled 2020-08-24: qty 120

## 2020-08-24 MED ORDER — SODIUM ZIRCONIUM CYCLOSILICATE 10 G PO PACK
10.0000 g | PACK | Freq: Two times a day (BID) | ORAL | Status: AC
  Administered 2020-08-24 (×2): 10 g via ORAL
  Filled 2020-08-24 (×2): qty 1

## 2020-08-24 NOTE — Progress Notes (Signed)
Inpatient Diabetes Program Recommendations  AACE/ADA: New Consensus Statement on Inpatient Glycemic Control (2015)  Target Ranges:  Prepandial:   less than 140 mg/dL      Peak postprandial:   less than 180 mg/dL (1-2 hours)      Critically ill patients:  140 - 180 mg/dL   Lab Results  Component Value Date   GLUCAP 299 (H) 08/24/2020    Review of Glycemic Control Results for Candace [name], Candace [name] (MRN [id number]) as of 08/24/2020 13:41  Ref. Range 08/23/2020 18:49 08/23/2020 21:33 08/24/2020 07:58 08/24/2020 12:36  Glucose-Capillary Latest Ref Range: 70 - 99 mg/dL 127 (H) 220 (H) 177 (H) 299 (H)   Diabetes history: DM2 Outpatient Diabetes medications:  Metformin 500 daily Current orders for Inpatient glycemic control:  Novolog 0-9 units tid Tradjenta 5 mg daily Solumedrol 60 mg q12h  Inpatient Diabetes Program Recommendations:    If post prandials remain elevated, might consider,  Novolog 3 units tid with meals if eats at least 50% of meal and changing diet to carb modified  Will continue to follow while inpatient.  Thank you, Reche Dixon, RN, BSN Diabetes Coordinator Inpatient Diabetes Program [phone] (team pager from 8a-5p)

## 2020-08-24 NOTE — Progress Notes (Signed)
PROGRESS NOTE                                                                                                                                                                                                             Patient Demographics:    Candace [name], is a 69 y.o. female, DOB - 09-01-1951, ZOX:[id number]  Outpatient Primary MD for the patient is Patient, No Pcp Per    LOS - 1  Admit date - 08/23/2020    CC -      Brief Narrative (HPI from H&P)   Candace [name] is a 69 y.o. female with medical history significant of hypertension, hyperlipidemia, diabetes mellitus, who comes to the hospital from urgent care due to hypotension as well as weakness.  Patient has been having generalized weakness, intermittent fever and chills for the past 7 to 8 days, she went to an urgent care on 10/21 when she tested positive for Covid, subsequently felt weak, eventually presented to the ER where she was found to have dehydration, AKI, hypoxic respiratory failure due to COVID-19 pneumonia and admitted to the hospital.   Subjective:    [place] today has, No headache, No chest pain, No abdominal pain - No Nausea, No new weakness tingling or numbness, no SOB.   Assessment  & Plan :     1. Acute Hypoxic Resp. Failure due to Acute Covid 19 Viral Pneumonitis during the ongoing 2020 Covid 19 Pandemic - she is unfortunately unvaccinated and seems to have incurred moderate parenchymal lung injury, has been started on IV steroids and Remdesivir.  Has consented for Actemra/Baricitinib use if she gets clinically worse.  Encouraged the patient to sit up in chair in the daytime use I-S and flutter valve for pulmonary toiletry and then prone in bed when at night.  Will advance activity and titrate down oxygen as possible.  Actemra/Baricitinib  off label use - patient was told that if COVID-19 pneumonitis gets worse we might potentially use Actemra off  label, patient denies any known history of active diverticulitis, tuberculosis or hepatitis, understands the risks and benefits and wants to proceed with Actemra treatment if required.     SpO2: 92 % O2 Flow Rate (L/min): 2 L/min  Recent Labs  Lab 08/23/20 1205 08/23/20 1220 08/23/20 1430 08/23/20 1436 08/24/20 0122 08/24/20 0834  WBC 5.9  --   --   --  5.9  --   HGB 14.5  --   --   --  12.6  --   HCT 47.5*  --   --   --  39.8  --   PLT 261  --   --   --  211  --   CRP  --   --   --   --  8.7*  --   DDIMER 1.44*  --   --   --  1.51*  --   PROCALCITON 4.83  --   --   --   --  2.28  AST 51*  --   --   --  43*  --   ALT 26  --   --   --  21  --   ALKPHOS 66  --   --   --  57  --   BILITOT 0.7  --   --   --  0.5  --   ALBUMIN 3.6  --   --   --  2.9*  --   LATICACIDVEN  --  3.3* 1.1  --   --   --   SARSCOV2NAA  --   --   --  POSITIVE*  --   --     2.  Dehydration, hyponatremia with AKI.  Hydrate with IV fluids, check urine electrolytes and renal ultrasound, monitor renal function.  3.  Hypertension.  Currently dehydration with hypotension.  Hold blood pressure medications and give IV fluids.  4.  Elevated D-dimer due to inflammation.  Prophylactic dose heparin, check leg ultrasound.  5. Dyslipidemia.  On statin.  6.  DM type II.  On sliding scale.  Will monitor and adjust.  No results found for: HGBA1C CBG (last 3)  Recent Labs    08/23/20 1849 08/23/20 2133 08/24/20 0758  GLUCAP 127* 220* 177*       Condition - Extremely Guarded  Family Communication  : Called spouse [phone] on 08/24/2020 at 12:05 PM.  Message left.  Code Status :  Full  Consults  :  None  Procedures  :    Leg [place] -   PUD Prophylaxis : None  Disposition Plan  :    Status is: Inpatient  Remains inpatient appropriate because:IV treatments appropriate due to intensity of illness or inability to take PO   Dispo: The patient is from: Home              Anticipated d/c is to: Home               Anticipated d/c date is: > 3 days              Patient currently is not medically stable to d/c.  DVT Prophylaxis  :   Heparin    Lab Results  Component Value Date   PLT 211 08/24/2020    Diet :  Diet Order            Diet regular Room service appropriate? Yes; Fluid consistency: Thin  Diet effective now                  Inpatient Medications  Scheduled Meds: . aspirin EC  81 mg Oral Daily  . heparin  5,000 Units Subcutaneous Q8H  . insulin aspart  0-9 Units Subcutaneous TID WC  . linagliptin  5 mg Oral Daily  . methylPREDNISolone (SOLU-MEDROL) injection  60 mg Intravenous Q12H  . rosuvastatin  40 mg Oral Daily  . sodium  zirconium cyclosilicate  10 g Oral BID   Continuous Infusions: . remdesivir 100 mg in NS 100 mL 100 mg (08/24/20 0832)   PRN Meds:.acetaminophen, albuterol, chlorpheniramine-HYDROcodone, guaiFENesin-dextromethorphan, [DISCONTINUED] ondansetron **OR** ondansetron (ZOFRAN) IV  Antibiotics  :    Anti-infectives (From admission, onward)   Start     Dose/Rate Route Frequency Ordered Stop   08/24/20 1000  remdesivir 100 mg in sodium chloride 0.9 % 100 mL IVPB  Status:  Discontinued       "Followed by" Linked Group Details   100 mg 200 mL/hr over 30 Minutes Intravenous Daily 08/23/20 1434 08/23/20 1435   08/24/20 1000  remdesivir 100 mg in sodium chloride 0.9 % 100 mL IVPB       "Followed by" Linked Group Details   100 mg 200 mL/hr over 30 Minutes Intravenous Daily 08/23/20 1426 08/28/20 0959   08/23/20 1434  remdesivir 200 mg in sodium chloride 0.9% 250 mL IVPB  Status:  Discontinued       "Followed by" Linked Group Details   200 mg 580 mL/hr over 30 Minutes Intravenous Once 08/23/20 1434 08/23/20 1435   08/23/20 1430  remdesivir 200 mg in sodium chloride 0.9% 250 mL IVPB       "Followed by" Linked Group Details   200 mg 580 mL/hr over 30 Minutes Intravenous Once 08/23/20 1426 08/23/20 1702       Time Spent in minutes  30   Lala Lund M.D on 08/24/2020 at 12:05 PM  To page go to [email or web address] - password [facility]  [facility] -  Office  [phone]   See all Orders from today for further details    Objective:   Vitals:   08/23/20 2132 08/24/20 0013 08/24/20 0511 08/24/20 0754  BP: 93/64 92/62 (!) 95/54 (!) 100/59  Pulse: 96 87 88 86  Resp: (!) 22 18 19 18   Temp: 98.8 F (37.1 C) 98.7 F (37.1 C) 98 F (36.7 C) 97.9 F (36.6 C)  TempSrc: Oral Oral Axillary Oral  SpO2: 92% 97% 92% 92%  Weight:      Height:        Wt Readings from Last 3 Encounters:  08/23/20 99.8 kg     Intake/Output Summary (Last 24 hours) at 08/24/2020 1205 Last data filed at 08/24/2020 0911 Gross per 24 hour  Intake 3109.8 ml  Output 500 ml  Net 2609.8 ml     Physical Exam  Awake Alert, No new F.N deficits, Normal affect .AT,PERRAL Supple Neck,No JVD, No cervical lymphadenopathy appriciated.  Symmetrical Chest wall movement, Good air movement bilaterally, CTAB RRR,No Gallops,Rubs or new Murmurs, No Parasternal Heave +ve B.Sounds, Abd Soft, No tenderness, No organomegaly appriciated, No rebound - guarding or rigidity. No Cyanosis, Clubbing or edema, No new Rash or bruise       Data Review:    CBC Recent Labs  Lab 08/23/20 1205 08/24/20 0122  WBC 5.9 5.9  HGB 14.5 12.6  HCT 47.5* 39.8  PLT 261 211  MCV 73.9* 72.5*  MCH 22.6* 23.0*  MCHC 30.5 31.7  RDW 16.1* 15.2  LYMPHSABS 0.5* 0.3*  MONOABS 0.9 0.3  EOSABS 0.0 0.0  BASOSABS 0.0 0.0    Recent Labs  Lab 08/23/20 1205 08/23/20 1220 08/23/20 1430 08/24/20 0122 08/24/20 0834  NA 131*  --   --  134*  --   K 4.4  --   --  5.4*  --   CL 94*  --   --  105  --  CO2 18*  --   --  14*  --   GLUCOSE 171*  --   --  216*  --   BUN 70*  --   --  80*  --   CREATININE 5.62*  --   --  3.63*  --   CALCIUM 10.2  --   --  9.1  --   AST 51*  --   --  43*  --   ALT 26  --   --  21  --   ALKPHOS 66  --   --  57  --   BILITOT 0.7  --   --  0.5  --    ALBUMIN 3.6  --   --  2.9*  --   MG  --   --   --   --  2.3  CRP  --   --   --  8.7*  --   DDIMER 1.44*  --   --  1.51*  --   PROCALCITON 4.83  --   --   --  2.28  LATICACIDVEN  --  3.3* 1.1  --   --     ------------------------------------------------------------------------------------------------------------------ Recent Labs    08/23/20 1205  TRIG 239*    No results found for: HGBA1C ------------------------------------------------------------------------------------------------------------------ No results for input(s): TSH, T4TOTAL, T3FREE, THYROIDAB in the last 72 hours.  Invalid input(s): FREET3  Cardiac Enzymes No results for input(s): CKMB, TROPONINI, MYOGLOBIN in the last 168 hours.  Invalid input(s): CK ------------------------------------------------------------------------------------------------------------------ No results found for: BNP  Micro Results Recent Results (from the past 240 hour(s))  Blood Culture (routine x 2)     Status: None (Preliminary result)   Collection Time: 08/23/20 11:23 AM   Specimen: BLOOD RIGHT ARM  Result Value Ref Range Status   Specimen Description BLOOD RIGHT ARM  Final   Special Requests   Final    BOTTLES DRAWN AEROBIC ONLY Blood Culture adequate volume   Culture   Final    NO GROWTH < 24 HOURS Performed at [facility], [address] [place], [place] [address]    Report Status PENDING  Incomplete  Blood Culture (routine x 2)     Status: None (Preliminary result)   Collection Time: 08/23/20 11:53 AM   Specimen: BLOOD RIGHT HAND  Result Value Ref Range Status   Specimen Description BLOOD RIGHT HAND  Final   Special Requests   Final    BOTTLES DRAWN AEROBIC AND ANAEROBIC Blood Culture results may not be optimal due to an inadequate volume of blood received in culture bottles   Culture   Final    NO GROWTH < 24 HOURS Performed at [facility], [place] [address] [place], [place] [address]    Report Status  PENDING  Incomplete  Resp Panel by RT PCR (RSV, Flu A&B, Covid) - Nasopharyngeal Swab     Status: Abnormal   Collection Time: 08/23/20  2:36 PM   Specimen: Nasopharyngeal Swab  Result Value Ref Range Status   SARS Coronavirus 2 by RT PCR POSITIVE (A) NEGATIVE Final    Comment: RESULT CALLED TO, READ BACK BY AND VERIFIED WITH: A COLEMAN RN 08/23/20 AT 1740 SK (NOTE) SARS-CoV-2 target nucleic acids are DETECTED.  SARS-CoV-2 RNA is generally detectable in upper respiratory specimens  during the acute phase of infection. Positive results are indicative of the presence of the identified virus, but do not rule out bacterial infection or co-infection with other pathogens not detected by  the test. Clinical correlation with patient history and other diagnostic information is necessary to determine patient infection status. The expected result is Negative.  Fact Sheet for Patients:  PinkCheek.be  Fact Sheet for Healthcare Providers: GravelBags.it  This test is not yet approved or cleared by the [place] FDA and  has been authorized for detection and/or diagnosis of SARS-CoV-2 by FDA under an Emergency Use Authorization (EUA).  This EUA will remain in effect (meaning this test can be [place] ed) for the duration of  the COVID-19 declaration under Section 564(b)(1) of the Act, 21 U.S.C. section 360bbb-3(b)(1), unless the authorization is terminated or revoked sooner.      Influenza A by PCR NEGATIVE NEGATIVE Final   Influenza B by PCR NEGATIVE NEGATIVE Final    Comment: (NOTE) The Xpert Xpress SARS-CoV-2/FLU/RSV assay is intended as an aid in  the diagnosis of influenza from Nasopharyngeal swab specimens and  should not be used as a sole basis for treatment. Nasal washings and  aspirates are unacceptable for Xpert Xpress SARS-CoV-2/FLU/RSV  testing.  Fact Sheet for Patients: PinkCheek.be  Fact  Sheet for Healthcare Providers: GravelBags.it  This test is not yet approved or cleared by the [place] FDA and  has been authorized for detection and/or diagnosis of SARS-CoV-2 by  FDA under an Emergency Use Authorization (EUA). This EUA will remain  in effect (meaning this test can be used) for the duration of the  Covid-19 declaration under Section 564(b)(1) of the Act, 21  U.S.C. section 360bbb-3(b)(1), unless the authorization is  terminated or revoked.    Respiratory Syncytial Virus by PCR NEGATIVE NEGATIVE Final    Comment: (NOTE) Fact Sheet for Patients: PinkCheek.be  Fact Sheet for Healthcare Providers: GravelBags.it  This test is not yet approved or cleared by the [place] FDA and  has been authorized for detection and/or diagnosis of SARS-CoV-2 by  FDA under an Emergency Use Authorization (EUA). This EUA will remain  in effect (meaning this test can be used) for the duration of the  COVID-19 declaration under Section 564(b)(1) of the Act, 21 U.S.C.  section 360bbb-3(b)(1), unless the authorization is terminated or  revoked. Performed at [facility], [place] [address], [place], [place] [address]   MRSA PCR Screening     Status: None   Collection Time: 08/24/20  8:50 AM   Specimen: Nasal Mucosa; Nasopharyngeal  Result Value Ref Range Status   MRSA by PCR NEGATIVE NEGATIVE Final    Comment:        The GeneXpert MRSA Assay (FDA approved for NASAL specimens only), is one component of a comprehensive MRSA colonization surveillance program. It is not intended to diagnose MRSA infection nor to guide or monitor treatment for MRSA infections. Performed at [facility], [place] [address] [place], [place] [address]     Radiology Reports DG Chest [place] 1 View  Result Date: 08/23/2020 CLINICAL DATA:  Shortness of breath EXAM: PORTABLE CHEST 1 VIEW COMPARISON:  None.  FINDINGS: There is ill-defined airspace opacity in portions of each mid and lower lung region. Heart size and pulmonary vascularity are normal. No adenopathy. There is degenerative change in the left shoulder. IMPRESSION: Ill-defined airspace opacity in the mid and lower lung regions, most indicative of multifocal pneumonia. Question atypical organism pneumonia given this appearance. Check of COVID-19 status in this regard advised. Heart size within normal limits.  No adenopathy appreciable. Electronically Signed   By: Lowella Grip III M.D.   On: 08/23/2020  11:33     

## 2020-08-24 NOTE — Progress Notes (Signed)
Lower extremity venous bilateral study completed.   Please see CV Proc for preliminary results.   Lenda Baratta, RDMS  

## 2020-08-24 NOTE — Evaluation (Signed)
Physical Therapy Evaluation Patient Details Name: Candace [name] MRN: [id number] DOB: August 27, 1951 Today's Date: 08/24/2020   History of Present Illness  69 y.o. female with medical history significant of hypertension, hyperlipidemia, diabetes mellitus, and anal cancer with colostomy who comes to the hospital from urgent care due to hypotension as well as weakness. Tested +COVID 10/21 at Urgent care; unvaccinated.   Clinical Impression   Pt admitted with above diagnosis. Patient completely independent PTA, however was sick for some time prior to testing and hospitalization with generalized weakness. She currently was very groggy (states she has been sleeping a lot at home). Required only min assist with use of RW to/from bathroom, however did not use a device PTA.  Pt currently with functional limitations due to the deficits listed below (see PT Problem List). Pt will benefit from skilled PT to increase their independence and safety with mobility to allow discharge to the venue listed below.       Follow Up Recommendations Home health PT;Supervision/Assistance - 24 hour    Equipment Recommendations  Rolling walker with 5" wheels (may improve and not need)    Recommendations for Other Services OT consult     Precautions / Restrictions Precautions Precautions: None Precaution Comments: colostomy; denies falls       Mobility  Bed Mobility Overal bed mobility: Needs Assistance Bed Mobility: Sit to Supine       Sit to supine: Min assist   General bed mobility comments: assist with raising legs onto bed    Transfers Overall transfer level: Needs assistance Equipment used: Rolling walker (2 wheeled);None Transfers: Sit to/from Stand Sit to Stand: Min assist         General transfer comment: without device pt reaching for UE support once standing; with RW, less assist needed  Ambulation/Gait Ambulation/Gait assistance: Min guard Gait Distance (Feet): 20 Feet (x2) Assistive  device: None;Rolling walker (2 wheeled) Gait Pattern/deviations: Step-through pattern;Decreased stride length;Trunk flexed     General Gait Details: without device, pt reaching to hold onto O2 tank carrier; on return from bathroom used RW with improved upright posture and balance  Stairs            Wheelchair Mobility    Modified Rankin (Stroke Patients Only)       Balance Overall balance assessment: Needs assistance Sitting-balance support: No upper extremity supported;Feet supported Sitting balance-Leahy Scale: Fair     Standing balance support: No upper extremity supported Standing balance-Leahy Scale: Poor Standing balance comment: reaching for UE support as feels weak                             Pertinent Vitals/Pain Pain Assessment: No/denies pain    Home Living Family/patient expects to be discharged to:: Private residence Living Arrangements: Spouse/significant other;Children;Other relatives (daughter, son, grandkids) Available Help at Discharge: Family Type of Home: House Home Access: Stairs to enter Entrance Stairs-Rails: Psychiatric nurse of Steps: 4 Home Layout: One level Home Equipment: None      Prior Function Level of Independence: Independent               Hand Dominance        Extremity/Trunk Assessment   Upper Extremity Assessment Upper Extremity Assessment: Defer to OT evaluation    Lower Extremity Assessment Lower Extremity Assessment: Generalized weakness    Cervical / Trunk Assessment Cervical / Trunk Assessment: Other exceptions Cervical / Trunk Exceptions: colostomy  Communication   Communication: No difficulties  Cognition Arousal/Alertness: Lethargic Behavior During Therapy: Flat affect Overall Cognitive Status: Impaired/Different from baseline Area of Impairment: Problem solving                             Problem Solving: Slow processing;Decreased initiation;Requires  verbal cues General Comments: very groggy with slow processing and requiring cues for sequencing with toileting task      General Comments General comments (skin integrity, edema, etc.): on 2L with sats 90% to 84% and recovered to 91% after one minute seated. On 3L walking decr to 87% and incr to 90% after 3 minutes seated rest    Exercises Other Exercises Other Exercises: Educated on use of PLB with fair ability to perform (tends to do 2 breaths and then stop and return to faster breaths)   Assessment/Plan    PT Assessment Patient needs continued PT services  PT Problem List Decreased strength;Decreased activity tolerance;Decreased balance;Decreased mobility;Decreased cognition;Decreased knowledge of use of DME;Cardiopulmonary status limiting activity;Obesity       PT Treatment Interventions DME instruction;Gait training;Functional mobility training;Therapeutic activities;Therapeutic exercise;Cognitive remediation;Patient/family education    PT Goals (Current goals can be found in the Care Plan section)  Acute Rehab PT Goals Patient Stated Goal: get better and go home PT Goal Formulation: With patient Time For Goal Achievement: 09/07/20 Potential to Achieve Goals: Good    Frequency Min 3X/week   Barriers to discharge        Co-evaluation               AM-PAC PT "6 Clicks" Mobility  Outcome Measure Help needed turning from your back to your side while in a flat bed without using bedrails?: A Little Help needed moving from lying on your back to sitting on the side of a flat bed without using bedrails?: A Little Help needed moving to and from a bed to a chair (including a wheelchair)?: A Little Help needed standing up from a chair using your arms (e.g., wheelchair or bedside chair)?: A Little Help needed to walk in hospital room?: A Little Help needed climbing 3-5 steps with a railing? : A Lot 6 Click Score: 17    End of Session Equipment Utilized During Treatment:  Gait belt;Oxygen Activity Tolerance: Patient limited by fatigue Patient left: in bed;with call bell/phone within reach;with bed alarm set Nurse Communication: Mobility status;Other (comment) (needs new purewick) PT Visit Diagnosis: Muscle weakness (generalized) (M62.81);Difficulty in walking, not elsewhere classified (R26.2)    Time: 9417-4081 PT Time Calculation (min) (ACUTE ONLY): 35 min   Charges:   PT Evaluation $PT Eval Low Complexity: 1 Low PT Treatments $Gait Training: 8-22 mins         Arby Barrette, PT Pager [phone]   Rexanne Mano 08/24/2020, 2:56 PM

## 2020-08-24 NOTE — Progress Notes (Signed)
CRITICAL VALUE ALERT  Critical Value:  327 Serum osmolality  Date & Time Notied:  08/24/20 1505  Provider Notified: Candiss Norse MD  Orders Received/Actions taken: None at this time

## 2020-08-25 ENCOUNTER — Inpatient Hospital Stay (HOSPITAL_COMMUNITY): Payer: Medicare Other

## 2020-08-25 LAB — COMPREHENSIVE METABOLIC PANEL
ALT: 21 U/L (ref 0–44)
AST: 26 U/L (ref 15–41)
Albumin: 2.6 g/dL — ABNORMAL LOW (ref 3.5–5.0)
Alkaline Phosphatase: 53 U/L (ref 38–126)
Anion gap: 12 (ref 5–15)
BUN: 97 mg/dL — ABNORMAL HIGH (ref 8–23)
CO2: 18 mmol/L — ABNORMAL LOW (ref 22–32)
Calcium: 9.4 mg/dL (ref 8.9–10.3)
Chloride: 110 mmol/L (ref 98–111)
Creatinine, Ser: 2.56 mg/dL — ABNORMAL HIGH (ref 0.44–1.00)
GFR, Estimated: 20 mL/min — ABNORMAL LOW (ref >60)
Glucose, Bld: 230 mg/dL — ABNORMAL HIGH (ref 70–99)
Potassium: 4.4 mmol/L (ref 3.5–5.1)
Sodium: 140 mmol/L (ref 135–145)
Total Bilirubin: 0.8 mg/dL (ref 0.3–1.2)
Total Protein: 6.6 g/dL (ref 6.5–8.1)

## 2020-08-25 LAB — CBC WITH DIFFERENTIAL/PLATELET
Abs Immature Granulocytes: 0.05 10*3/uL (ref 0.00–0.07)
Basophils Absolute: 0 10*3/uL (ref 0.0–0.1)
Basophils Relative: 0 %
Eosinophils Absolute: 0 10*3/uL (ref 0.0–0.5)
Eosinophils Relative: 0 %
HCT: 38.1 % (ref 36.0–46.0)
Hemoglobin: 12.2 g/dL (ref 12.0–15.0)
Immature Granulocytes: 1 %
Lymphocytes Relative: 0 %
Lymphs Abs: 0 10*3/uL — ABNORMAL LOW (ref 0.7–4.0)
MCH: 22.8 pg — ABNORMAL LOW (ref 26.0–34.0)
MCHC: 32 g/dL (ref 30.0–36.0)
MCV: 71.2 fL — ABNORMAL LOW (ref 80.0–100.0)
Monocytes Absolute: 1.2 10*3/uL — ABNORMAL HIGH (ref 0.1–1.0)
Monocytes Relative: 13 %
Neutro Abs: 7.6 10*3/uL (ref 1.7–7.7)
Neutrophils Relative %: 86 %
Platelets: 256 10*3/uL (ref 150–400)
RBC: 5.35 MIL/uL — ABNORMAL HIGH (ref 3.87–5.11)
RDW: 15.4 % (ref 11.5–15.5)
WBC: 8.9 10*3/uL (ref 4.0–10.5)
nRBC: 0 % (ref 0.0–0.2)

## 2020-08-25 LAB — D-DIMER, QUANTITATIVE: D-Dimer, Quant: 1.82 ug/mL-FEU — ABNORMAL HIGH (ref 0.00–0.50)

## 2020-08-25 LAB — HEMOGLOBIN A1C
Hgb A1c MFr Bld: 7 % — ABNORMAL HIGH (ref 4.8–5.6)
Mean Plasma Glucose: 154 mg/dL

## 2020-08-25 LAB — GLUCOSE, CAPILLARY
Glucose-Capillary: 269 mg/dL — ABNORMAL HIGH (ref 70–99)
Glucose-Capillary: 325 mg/dL — ABNORMAL HIGH (ref 70–99)
Glucose-Capillary: 301 mg/dL — ABNORMAL HIGH (ref 70–99)
Glucose-Capillary: 284 mg/dL — ABNORMAL HIGH (ref 70–99)

## 2020-08-25 LAB — MAGNESIUM: Magnesium: 2.4 mg/dL (ref 1.7–2.4)

## 2020-08-25 LAB — PROCALCITONIN: Procalcitonin: 1.62 ng/mL

## 2020-08-25 LAB — C-REACTIVE PROTEIN: CRP: 6.8 mg/dL — ABNORMAL HIGH (ref <1.0)

## 2020-08-25 LAB — BRAIN NATRIURETIC PEPTIDE: B Natriuretic Peptide: 33.2 pg/mL (ref 0.0–100.0)

## 2020-08-25 MED ORDER — SODIUM CHLORIDE 0.9 % IV SOLN
1.0000 g | INTRAVENOUS | Status: AC
  Administered 2020-08-25 – 2020-08-27 (×3): 1 g via INTRAVENOUS
  Filled 2020-08-25 (×3): qty 10

## 2020-08-25 MED ORDER — INSULIN GLARGINE 100 UNIT/ML ~~LOC~~ SOLN
20.0000 [IU] | Freq: Every day | SUBCUTANEOUS | Status: DC
  Administered 2020-08-25 – 2020-08-29 (×5): 20 [IU] via SUBCUTANEOUS
  Filled 2020-08-25 (×5): qty 0.2

## 2020-08-25 MED ORDER — APIXABAN 2.5 MG PO TABS
2.5000 mg | ORAL_TABLET | Freq: Two times a day (BID) | ORAL | Status: DC
  Administered 2020-08-25 – 2020-08-29 (×9): 2.5 mg via ORAL
  Filled 2020-08-25 (×9): qty 1

## 2020-08-25 MED ORDER — LACTATED RINGERS IV SOLN: INTRAVENOUS | Status: DC

## 2020-08-25 NOTE — Evaluation (Addendum)
Occupational Therapy Evaluation Patient Details Name: Candace [name] MRN: [id number] DOB: April 08, 1951 Today's Date: 08/25/2020    History of Present Illness 69 y.o. female with medical history significant of hypertension, hyperlipidemia, diabetes mellitus, and anal cancer with colostomy who comes to the hospital from urgent care due to hypotension as well as weakness. Tested +COVID 10/21 at Urgent care; unvaccinated.    Clinical Impression   PTA pt living with family, functioning at independent level. At time of eval, pt able to complete bed mobility and transfers with min A with RW. Pt able to then complete functional mobility out the door (only enough to turn around) and back to EOB. During this time, HR was up to 142 bpm and RR up to 54. Pt with flat affect the entire time, not reporting symptoms and attempting to keep walking, despite OTs consistent encouragement to sit for rest break. Pt required 6L [place] to maintain O2 stas >85% with activity. Educated pt on pursed lip breathing with activity, but continued to require ~10 mins to recover on 3L [place]. RN informed. Throughout session, pt presenting very flat with little awareness and poor problem solving skills. Given current status, post acute rehab may need be to be considered. OT will continue to follow per POC listed below.    Follow Up Recommendations  SNF;Other (comment) (pending progress)    Equipment Recommendations  3 in 1 bedside commode;Wheelchair (measurements OT);Wheelchair cushion (measurements OT)    Recommendations for Other Services       Precautions / Restrictions Precautions Precautions: Fall Precaution Comments: baseline colostomy Restrictions Weight Bearing Restrictions: No      Mobility Bed Mobility Overal bed mobility: Needs Assistance Bed Mobility: Supine to Sit     Supine to sit: Min assist Sit to supine: Min assist   General bed mobility comments: assist for trunk to come into sitting and assist to guide  LEs back onto bed    Transfers Overall transfer level: Needs assistance Equipment used: Rolling walker (2 wheeled) Transfers: Sit to/from Stand Sit to Stand: Min assist         General transfer comment: assist for steadying    Balance Overall balance assessment: Needs assistance Sitting-balance support: No upper extremity supported;Feet supported Sitting balance-Leahy Scale: Fair     Standing balance support: No upper extremity supported;During functional activity Standing balance-Leahy Scale: Poor                             ADL either performed or assessed with clinical judgement   ADL Overall ADL's : Needs assistance/impaired Eating/Feeding: Set up;Sitting   Grooming: Minimal assistance;Standing   Upper Body Bathing: Minimal assistance;Sitting   Lower Body Bathing: Minimal assistance;Sit to/from stand;Sitting/lateral leans   Upper Body Dressing : Set up;Sitting   Lower Body Dressing: Minimal assistance;Sit to/from stand;Sitting/lateral leans   Toilet Transfer: Minimal assistance;Stand-pivot;BSC;RW   Toileting- Clothing Manipulation and Hygiene: Total assistance Toileting - Clothing Manipulation Details (indicate cue type and reason): currently total A to manage colostomy bag. RN reports pts bag had exploded from not being burped and had dried stool on her body and ripped skin from the bags pressue and she did not notify staff     Functional mobility during ADLs: Minimal assistance;Rolling walker;Cueing for safety General ADL Comments: limited by poor cardiopulmonary statud with ADL activity     Vision Patient Visual Report: No change from baseline       Perception     Praxis  Pertinent Vitals/Pain Pain Assessment: No/denies pain     Hand Dominance     Extremity/Trunk Assessment Upper Extremity Assessment Upper Extremity Assessment: Generalized weakness   Lower Extremity Assessment Lower Extremity Assessment: Generalized weakness        Communication Communication Communication: No difficulties   Cognition Arousal/Alertness: Awake/alert Behavior During Therapy: Flat affect Overall Cognitive Status: Impaired/Different from baseline Area of Impairment: Attention;Following commands;Safety/judgement;Awareness;Problem solving                   Current Attention Level: Sustained   Following Commands: Follows one step commands with increased time Safety/Judgement: Decreased awareness of safety;Decreased awareness of deficits Awareness: Emergent Problem Solving: Slow processing;Decreased initiation;Requires verbal cues General Comments: noted slow processing with poor problem solving abilities throughout session. Pt with decreased awareness of deficits, attempting to push to walk further when OT explaining her vital signs are indicating otherwise and she needs a rest break. Very flat affect   General Comments       Exercises     Shoulder Instructions      Home Living Family/patient expects to be discharged to:: Private residence Living Arrangements: Spouse/significant other;Children;Other relatives (daughter, son and gradkids who are also ill) Available Help at Discharge: Family Type of Home: House Home Access: Stairs to enter Technical brewer of Steps: 4 Entrance Stairs-Rails: Right;Left Home Layout: One level     Bathroom Shower/Tub: Teacher, early years/pre: Standard     Home Equipment: None          Prior Functioning/Environment Level of Independence: Independent                 OT Problem List: Decreased strength;Decreased knowledge of use of DME or AE;Decreased activity tolerance;Cardiopulmonary status limiting activity;Impaired balance (sitting and/or standing);Decreased safety awareness;Decreased cognition      OT Treatment/Interventions: Self-care/ADL training;Therapeutic exercise;Patient/family education;Balance training;Energy conservation;Therapeutic  activities;DME and/or AE instruction;Cognitive remediation/compensation    OT Goals(Current goals can be found in the care plan section) Acute Rehab OT Goals Patient Stated Goal: get better and go home OT Goal Formulation: With patient Time For Goal Achievement: 09/08/20 Potential to Achieve Goals: Good  OT Frequency: Min 3X/week   Barriers to D/C:            Co-evaluation              AM-PAC OT "6 Clicks" Daily Activity     Outcome Measure Help from another person eating meals?: A Little Help from another person taking care of personal grooming?: A Little Help from another person toileting, which includes using toliet, bedpan, or urinal?: A Lot Help from another person bathing (including washing, rinsing, drying)?: A Lot Help from another person to put on and taking off regular upper body clothing?: A Little Help from another person to put on and taking off regular lower body clothing?: A Lot 6 Click Score: 15   End of Session Equipment Utilized During Treatment: Rolling walker;Oxygen;Gait belt Nurse Communication: Mobility status  Activity Tolerance: Patient tolerated treatment well Patient left: in bed;with call bell/phone within reach  OT Visit Diagnosis: Unsteadiness on feet (R26.81);Other abnormalities of gait and mobility (R26.89);Muscle weakness (generalized) (M62.81);Other symptoms and signs involving cognitive function                Time: 1555-1631 OT Time Calculation (min): 36 min Charges:  OT General Charges $OT Visit: 1 Visit OT Evaluation $OT Eval Moderate Complexity: 1 Mod OT Treatments $Self Care/Home Management : 8-22 mins  Zenovia Jarred,  MSOT, OTR/L Acute Rehabilitation Services [facility] Office Number: [phone] Pager: [phone]  Zenovia Jarred 08/25/2020, 6:02 PM

## 2020-08-25 NOTE — Progress Notes (Signed)
PROGRESS NOTE                                                                                                                                                                                                             Patient Demographics:    Candace [name], is a 69 y.o. female, DOB - 1951-03-04, NAT:[id number]  Outpatient Primary MD for the patient is Patient, No Pcp Per    LOS - 2  Admit date - 08/23/2020    CC -      Brief Narrative (HPI from H&P)   Candace [name] is a 69 y.o. female with medical history significant of hypertension, hyperlipidemia, diabetes mellitus, who comes to the hospital from urgent care due to hypotension as well as weakness.  Patient has been having generalized weakness, intermittent fever and chills for the past 7 to 8 days, she went to an urgent care on 10/21 when she tested positive for Covid, subsequently felt weak, eventually presented to the ER where she was found to have dehydration, AKI, hypoxic respiratory failure due to COVID-19 pneumonia and admitted to the hospital.   Subjective:   Patient in bed, appears comfortable, denies any headache, no fever, no chest pain or pressure, impoved shortness of breath , no abdominal pain. No focal weakness.    Assessment  & Plan :     1. Acute Hypoxic Resp. Failure due to Acute Covid 19 Viral Pneumonitis during the ongoing 2020 Covid 19 Pandemic - she is unfortunately unvaccinated and seems to have incurred moderate parenchymal lung injury, has been started on IV steroids and Remdesivir.  Has consented for Actemra/Baricitinib use if she gets clinically worse.  Encouraged the patient to sit up in chair in the daytime use I-S and flutter valve for pulmonary toiletry and then prone in bed when at night.  Will advance activity and titrate down oxygen as possible.   SpO2: 100 % O2 Flow Rate (L/min): 2 L/min  Recent Labs  Lab 08/23/20 1205 08/23/20 1220  08/23/20 1430 08/23/20 1436 08/24/20 0122 08/24/20 0834 08/25/20 0044  WBC 5.9  --   --   --  5.9  --  8.9  HGB 14.5  --   --   --  12.6  --  12.2  HCT 47.5*  --   --   --  39.8  --  38.1  PLT 261  --   --   --  211  --  256  CRP  --   --   --   --  8.7*  --  6.8*  BNP  --   --   --   --   --   --  33.2  DDIMER 1.44*  --   --   --  1.51*  --  1.82*  PROCALCITON 4.83  --   --   --   --  2.28 1.62  AST 51*  --   --   --  43*  --  26  ALT 26  --   --   --  21  --  21  ALKPHOS 66  --   --   --  57  --  53  BILITOT 0.7  --   --   --  0.5  --  0.8  ALBUMIN 3.6  --   --   --  2.9*  --  2.6*  LATICACIDVEN  --  3.3* 1.1  --   --   --   --   SARSCOV2NAA  --   --   --  POSITIVE*  --   --   --     2.  Dehydration, hyponatremia with AKI.  Improved with hydration, continue to monitor, nonacute renal ultrasound with mild hydronephrosis likely due to ureteral jets, will require outpatient urology follow-up.  3.  Hypertension.  Currently dehydration with hypotension.  Hold blood pressure medications and give IV fluids.  4.  Elevated D-dimer due to inflammation.  Prophylactic dose heparin, negative leg ultrasound.  5. Dyslipidemia.  On statin.  6.  UTI.  Rocephin x3 days.   7.  History of colon cancer.  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  Outpatient PCP follow-up.    8. DM type II.  On sliding scale.  Add Lantus for better control and monitor.  Lab Results  Component Value Date   HGBA1C 7.0 (H) 08/24/2020   CBG (last 3)  Recent Labs    08/24/20 2138 08/25/20 0720 08/25/20 1153  GLUCAP 200* 269* 325*       Condition -   Guarded  Family Communication  : Called spouse [phone] on 08/24/2020 at 12:05 PM.  Message left.  Son updated via patient's cell phone on 08/25/2020.  Code Status :  Full  Consults  :  None  Procedures  :    Renal US - Cystic changes at the renal hila likely parapelvic cysts as seen on prior CT. Mild bilateral hydronephrosis is felt less  likely as both ureteral jets are seen in the bladder  Leg [place] - No DVT  PUD Prophylaxis : None  Disposition Plan  :    Status is: Inpatient  Remains inpatient appropriate because:IV treatments appropriate due to intensity of illness or inability to take PO   Dispo: The patient is from: Home              Anticipated d/c is to: Home              Anticipated d/c date is: > 3 days              Patient currently is not medically stable to d/c.  DVT Prophylaxis  : We will place on prophylactic Eliquis as cannot use Lovenox moderate dose due to changing renal insufficiency and heparin seems to be insufficient.  Lab Results  Component Value Date  PLT 256 08/25/2020    Diet :  Diet Order            Diet regular Room service appropriate? Yes; Fluid consistency: Thin  Diet effective now                  Inpatient Medications  Scheduled Meds: . aspirin EC  81 mg Oral Daily  . heparin  5,000 Units Subcutaneous Q8H  . insulin aspart  0-9 Units Subcutaneous TID WC  . linagliptin  5 mg Oral Daily  . methylPREDNISolone (SOLU-MEDROL) injection  60 mg Intravenous Q12H  . rosuvastatin  40 mg Oral Daily   Continuous Infusions: . remdesivir 100 mg in NS 100 mL 100 mg (08/25/20 0916)   PRN Meds:.acetaminophen, albuterol, chlorpheniramine-HYDROcodone, guaiFENesin-dextromethorphan, [DISCONTINUED] ondansetron **OR** ondansetron (ZOFRAN) IV  Antibiotics  :    Anti-infectives (From admission, onward)   Start     Dose/Rate Route Frequency Ordered Stop   08/24/20 1000  remdesivir 100 mg in sodium chloride 0.9 % 100 mL IVPB  Status:  Discontinued       "Followed by" Linked Group Details   100 mg 200 mL/hr over 30 Minutes Intravenous Daily 08/23/20 1434 08/23/20 1435   08/24/20 1000  remdesivir 100 mg in sodium chloride 0.9 % 100 mL IVPB       "Followed by" Linked Group Details   100 mg 200 mL/hr over 30 Minutes Intravenous Daily 08/23/20 1426 08/28/20 0959   08/23/20 1434  remdesivir  200 mg in sodium chloride 0.9% 250 mL IVPB  Status:  Discontinued       "Followed by" Linked Group Details   200 mg 580 mL/hr over 30 Minutes Intravenous Once 08/23/20 1434 08/23/20 1435   08/23/20 1430  remdesivir 200 mg in sodium chloride 0.9% 250 mL IVPB       "Followed by" Linked Group Details   200 mg 580 mL/hr over 30 Minutes Intravenous Once 08/23/20 1426 08/23/20 1702       Time Spent in minutes  30   Lala Lund M.D on 08/25/2020 at 12:05 PM  To page go to [email or web address] - password [facility]  [facility] -  Office  [phone]   See all Orders from today for further details    Objective:   Vitals:   08/24/20 0754 08/24/20 1233 08/24/20 2137 08/25/20 0401  BP: (!) 100/59 113/62 106/63   Pulse: 86 95 89 93  Resp: 18 20 20 20   Temp: 97.9 F (36.6 C) 98.1 F (36.7 C) 98.7 F (37.1 C) 98.1 F (36.7 C)  TempSrc: Oral Oral Oral Oral  SpO2: 92% (!) 88% 100%   Weight:      Height:        Wt Readings from Last 3 Encounters:  08/23/20 99.8 kg     Intake/Output Summary (Last 24 hours) at 08/25/2020 1205 Last data filed at 08/25/2020 0910 Gross per 24 hour  Intake 100 ml  Output 150 ml  Net -50 ml     Physical Exam  Awake Alert, No new F.N deficits, Normal affect [place].AT,PERRAL Supple Neck,No JVD, No cervical lymphadenopathy appriciated.  Symmetrical Chest wall movement, Good air movement bilaterally, CTAB RRR,No Gallops, Rubs or new Murmurs, No Parasternal Heave +ve B.Sounds, Abd Soft, No tenderness, No organomegaly appriciated, No rebound - guarding or rigidity.  Colostomy bag in place. No Cyanosis, Clubbing or edema, No new Rash or bruise     Data Review:    CBC Recent Labs  Lab 08/23/20 1205 08/24/20  0122 08/25/20 0044  WBC 5.9 5.9 8.9  HGB 14.5 12.6 12.2  HCT 47.5* 39.8 38.1  PLT 261 211 256  MCV 73.9* 72.5* 71.2*  MCH 22.6* 23.0* 22.8*  MCHC 30.5 31.7 32.0  RDW 16.1* 15.2 15.4  LYMPHSABS 0.5* 0.3* 0.0*  MONOABS 0.9 0.3 1.2*   EOSABS 0.0 0.0 0.0  BASOSABS 0.0 0.0 0.0    Recent Labs  Lab 08/23/20 1205 08/23/20 1220 08/23/20 1430 08/24/20 0122 08/24/20 0834 08/24/20 1343 08/25/20 0044  NA 131*  --   --  134*  --   --  140  K 4.4  --   --  5.4*  --   --  4.4  CL 94*  --   --  105  --   --  110  CO2 18*  --   --  14*  --   --  18*  GLUCOSE 171*  --   --  216*  --   --  230*  BUN 70*  --   --  80*  --   --  97*  CREATININE 5.62*  --   --  3.63*  --   --  2.56*  CALCIUM 10.2  --   --  9.1  --   --  9.4  AST 51*  --   --  43*  --   --  26  ALT 26  --   --  21  --   --  21  ALKPHOS 66  --   --  57  --   --  53  BILITOT 0.7  --   --  0.5  --   --  0.8  ALBUMIN 3.6  --   --  2.9*  --   --  2.6*  MG  --   --   --   --  2.3  --  2.4  CRP  --   --   --  8.7*  --   --  6.8*  DDIMER 1.44*  --   --  1.51*  --   --  1.82*  PROCALCITON 4.83  --   --   --  2.28  --  1.62  LATICACIDVEN  --  3.3* 1.1  --   --   --   --   HGBA1C  --   --   --   --   --  7.0*  --   BNP  --   --   --   --   --   --  33.2    ------------------------------------------------------------------------------------------------------------------ Recent Labs    08/23/20 1205  TRIG 239*    Lab Results  Component Value Date   HGBA1C 7.0 (H) 08/24/2020   ------------------------------------------------------------------------------------------------------------------ No results for input(s): TSH, T4TOTAL, T3FREE, THYROIDAB in the last 72 hours.  Invalid input(s): FREET3  Cardiac Enzymes No results for input(s): CKMB, TROPONINI, MYOGLOBIN in the last 168 hours.  Invalid input(s): CK ------------------------------------------------------------------------------------------------------------------    Component Value Date/Time   BNP 33.2 08/25/2020 0044    Micro Results Recent Results (from the past 240 hour(s))  Blood Culture (routine x 2)     Status: None (Preliminary result)   Collection Time: 08/23/20 11:23 AM   Specimen:  BLOOD RIGHT ARM  Result Value Ref Range Status   Specimen Description BLOOD RIGHT ARM  Final   Special Requests   Final    BOTTLES DRAWN AEROBIC ONLY Blood Culture adequate volume   Culture   Final  NO GROWTH 2 DAYS Performed at [facility], [place] [address] [place], [place] [address]    Report Status PENDING  Incomplete  Blood Culture (routine x 2)     Status: None (Preliminary result)   Collection Time: 08/23/20 11:53 AM   Specimen: BLOOD RIGHT HAND  Result Value Ref Range Status   Specimen Description BLOOD RIGHT HAND  Final   Special Requests   Final    BOTTLES DRAWN AEROBIC AND ANAEROBIC Blood Culture results may not be optimal due to an inadequate volume of blood received in culture bottles   Culture   Final    NO GROWTH 2 DAYS Performed at [facility], [place] [address], [place], [place] [address]    Report Status PENDING  Incomplete  Resp Panel by RT PCR (RSV, Flu A&B, Covid) - Nasopharyngeal Swab     Status: Abnormal   Collection Time: 08/23/20  2:36 PM   Specimen: Nasopharyngeal Swab  Result Value Ref Range Status   SARS Coronavirus 2 by RT PCR POSITIVE (A) NEGATIVE Final    Comment: RESULT CALLED TO, READ BACK BY AND VERIFIED WITH: A COLEMAN RN 08/23/20 AT 1740 SK (NOTE) SARS-CoV-2 target nucleic acids are DETECTED.  SARS-CoV-2 RNA is generally detectable in upper respiratory specimens  during the acute phase of infection. Positive results are indicative of the presence of the identified virus, but do not rule out bacterial infection or co-infection with other pathogens not detected by the test. Clinical correlation with patient history and other diagnostic information is necessary to determine patient infection status. The expected result is Negative.  Fact Sheet for Patients:  PinkCheek.be  Fact Sheet for Healthcare Providers: GravelBags.it  This test is not yet approved or cleared by the  [place] FDA and  has been authorized for detection and/or diagnosis of SARS-CoV-2 by FDA under an Emergency Use Authorization (EUA).  This EUA will remain in effect (meaning this test can be [place] ed) for the duration of  the COVID-19 declaration under Section 564(b)(1) of the Act, 21 U.S.C. section 360bbb-3(b)(1), unless the authorization is terminated or revoked sooner.      Influenza A by PCR NEGATIVE NEGATIVE Final   Influenza B by PCR NEGATIVE NEGATIVE Final    Comment: (NOTE) The Xpert Xpress SARS-CoV-2/FLU/RSV assay is intended as an aid in  the diagnosis of influenza from Nasopharyngeal swab specimens and  should not be used as a sole basis for treatment. Nasal washings and  aspirates are unacceptable for Xpert Xpress SARS-CoV-2/FLU/RSV  testing.  Fact Sheet for Patients: PinkCheek.be  Fact Sheet for Healthcare Providers: GravelBags.it  This test is not yet approved or cleared by the [place] FDA and  has been authorized for detection and/or diagnosis of SARS-CoV-2 by  FDA under an Emergency Use Authorization (EUA). This EUA will remain  in effect (meaning this test can be used) for the duration of the  Covid-19 declaration under Section 564(b)(1) of the Act, 21  U.S.C. section 360bbb-3(b)(1), unless the authorization is  terminated or revoked.    Respiratory Syncytial Virus by PCR NEGATIVE NEGATIVE Final    Comment: (NOTE) Fact Sheet for Patients: PinkCheek.be  Fact Sheet for Healthcare Providers: GravelBags.it  This test is not yet approved or cleared by the [place] FDA and  has been authorized for detection and/or diagnosis of SARS-CoV-2 by  FDA under an Emergency Use Authorization (EUA). This EUA will remain  in effect (meaning this test can be used) for the  duration of the  COVID-19 declaration under Section 564(b)(1) of the Act, 21  U.S.C.  section 360bbb-3(b)(1), unless the authorization is terminated or  revoked. Performed at [facility], [place] [address] [place], [place] [address]   MRSA PCR Screening     Status: None   Collection Time: 08/24/20  8:50 AM   Specimen: Nasal Mucosa; Nasopharyngeal  Result Value Ref Range Status   MRSA by PCR NEGATIVE NEGATIVE Final    Comment:        The GeneXpert MRSA Assay (FDA approved for NASAL specimens only), is one component of a comprehensive MRSA colonization surveillance program. It is not intended to diagnose MRSA infection nor to guide or monitor treatment for MRSA infections. Performed at [facility], [place] [address], [place], [place] [address]     Radiology Reports US RENAL  Result Date: 08/25/2020 CLINICAL DATA:  Acute kidney injury. EXAM: RENAL / URINARY TRACT ULTRASOUND COMPLETE COMPARISON:  Abdominal CT 06/07/2014 FINDINGS: Right Kidney: Renal measurements: 10.7 x 5.2 x 5.0 cm = volume: 146 mL. Cystic changes at the renal hila likely parapelvic cysts as seen on prior CT. Normal renal parenchymal echogenicity. No shadowing calculus. No solid renal mass. Left Kidney: Renal measurements: 10.7 x 6.2 x 5.7 cm = volume: 197 mL. Cystic changes at the renal hila likely parapelvic cyst is seen on prior CT. Normal renal parenchymal echogenicity. No shadowing calculus. No solid renal mass. Bladder: Appears normal for degree of bladder distention. Both ureteral jets are seen. Other: None. IMPRESSION: Cystic changes at the renal hila likely parapelvic cysts as seen on prior CT. Mild bilateral hydronephrosis is felt less likely as both ureteral jets are seen in the bladder. Electronically Signed   By: Keith Rake M.D.   On: 08/25/2020 01:55   DG Chest Port 1 View  Result Date: 08/25/2020 CLINICAL DATA:  Shortness of breath.  COVID-19 positive. EXAM: PORTABLE CHEST 1 VIEW COMPARISON:  August 23, 2020. FINDINGS: Stable cardiomediastinal silhouette. No  pneumothorax is noted. Stable bilateral lung opacities are noted concerning for multifocal pneumonia. Bony thorax is unremarkable. IMPRESSION: Stable bilateral lung opacities are noted concerning for multifocal pneumonia. Electronically Signed   By: Marijo Conception M.D.   On: 08/25/2020 09:36   DG Chest Port 1 View  Result Date: 08/23/2020 CLINICAL DATA:  Shortness of breath EXAM: PORTABLE CHEST 1 VIEW COMPARISON:  None. FINDINGS: There is ill-defined airspace opacity in portions of each mid and lower lung region. Heart size and pulmonary vascularity are normal. No adenopathy. There is degenerative change in the left shoulder. IMPRESSION: Ill-defined airspace opacity in the mid and lower lung regions, most indicative of multifocal pneumonia. Question atypical organism pneumonia given this appearance. Check of COVID-19 status in this regard advised. Heart size within normal limits.  No adenopathy appreciable. Electronically Signed   By: Lowella Grip III M.D.   On: 08/23/2020 11:33   VAS [place] LOWER EXTREMITY VENOUS (DVT)  Result Date: 08/24/2020  Lower Venous DVTStudy Indications: Elevated d-dimer, covid.  Comparison Study: No prior studies. Performing Technologist: Darlin Coco  Examination Guidelines: A complete evaluation includes B-mode imaging, spectral Doppler, color Doppler, and power Doppler as needed of all accessible portions of each vessel. Bilateral testing is considered an integral part of a complete examination. Limited examinations for reoccurring indications may be performed as noted. The reflux portion of the exam is performed with the patient in reverse Trendelenburg.  +---------+---------------+---------+-----------+----------+--------------+ RIGHT    CompressibilityPhasicitySpontaneityPropertiesThrombus Aging +---------+---------------+---------+-----------+----------+--------------+ CFV  Full           Yes      Yes                                  +---------+---------------+---------+-----------+----------+--------------+ SFJ      Full                                                        +---------+---------------+---------+-----------+----------+--------------+ FV Prox  Full                                                        +---------+---------------+---------+-----------+----------+--------------+ FV Mid   Full                                                        +---------+---------------+---------+-----------+----------+--------------+ FV DistalFull                                                        +---------+---------------+---------+-----------+----------+--------------+ PFV      Full                                                        +---------+---------------+---------+-----------+----------+--------------+ POP      Full           Yes      Yes                                 +---------+---------------+---------+-----------+----------+--------------+ PTV      Full                                                        +---------+---------------+---------+-----------+----------+--------------+ PERO     Full                                                        +---------+---------------+---------+-----------+----------+--------------+   +---------+---------------+---------+-----------+----------+--------------+ LEFT     CompressibilityPhasicitySpontaneityPropertiesThrombus Aging +---------+---------------+---------+-----------+----------+--------------+ CFV      Full           Yes      Yes                                 +---------+---------------+---------+-----------+----------+--------------+  SFJ      Full                                                        +---------+---------------+---------+-----------+----------+--------------+ FV Prox  Full                                                         +---------+---------------+---------+-----------+----------+--------------+ FV Mid   Full                                                        +---------+---------------+---------+-----------+----------+--------------+ FV DistalFull                                                        +---------+---------------+---------+-----------+----------+--------------+ PFV      Full                                                        +---------+---------------+---------+-----------+----------+--------------+ POP      Full           Yes      Yes                                 +---------+---------------+---------+-----------+----------+--------------+ PTV      Full                                                        +---------+---------------+---------+-----------+----------+--------------+ PERO     Full                                                        +---------+---------------+---------+-----------+----------+--------------+     Summary: RIGHT: - There is no evidence of deep vein thrombosis in the lower extremity.  - No cystic structure found in the popliteal fossa.  LEFT: - There is no evidence of deep vein thrombosis in the lower extremity.  - No cystic structure found in the popliteal fossa.  *See table(s) above for measurements and observations. Electronically signed by Deitra Mayo MD on 08/24/2020 at 5:36:23 PM.    Final

## 2020-08-25 NOTE — Progress Notes (Signed)
Inpatient Diabetes Program Recommendations  AACE/ADA: New Consensus Statement on Inpatient Glycemic Control (2015)  Target Ranges:  Prepandial:   less than 140 mg/dL      Peak postprandial:   less than 180 mg/dL (1-2 hours)      Critically ill patients:  140 - 180 mg/dL   Lab Results  Component Value Date   GLUCAP 269 (H) 08/25/2020   HGBA1C 7.0 (H) 08/24/2020    Review of Glycemic Control  FBS 269 this am. Post-prandials on 10/26: 299, 264, 200 mg/dL.  Inpatient Diabetes Program Recommendations:     Add Levemir 10 units bid Add Novolog 3 units tidwc for meal coverage insulin  Continue to follow.  Thank you. Lorenda Peck, RD, LDN, CDE Inpatient Diabetes Coordinator [phone]

## 2020-08-26 LAB — CBC WITH DIFFERENTIAL/PLATELET
Abs Immature Granulocytes: 0.1 10*3/uL — ABNORMAL HIGH (ref 0.00–0.07)
Basophils Absolute: 0 10*3/uL (ref 0.0–0.1)
Basophils Relative: 0 %
Eosinophils Absolute: 0 10*3/uL (ref 0.0–0.5)
Eosinophils Relative: 0 %
HCT: 40 % (ref 36.0–46.0)
Hemoglobin: 12.9 g/dL (ref 12.0–15.0)
Immature Granulocytes: 1 %
Lymphocytes Relative: 2 %
Lymphs Abs: 0.3 10*3/uL — ABNORMAL LOW (ref 0.7–4.0)
MCH: 23.1 pg — ABNORMAL LOW (ref 26.0–34.0)
MCHC: 32.3 g/dL (ref 30.0–36.0)
MCV: 71.6 fL — ABNORMAL LOW (ref 80.0–100.0)
Monocytes Absolute: 1.1 10*3/uL — ABNORMAL HIGH (ref 0.1–1.0)
Monocytes Relative: 10 %
Neutro Abs: 9.2 10*3/uL — ABNORMAL HIGH (ref 1.7–7.7)
Neutrophils Relative %: 87 %
Platelets: 287 10*3/uL (ref 150–400)
RBC: 5.59 MIL/uL — ABNORMAL HIGH (ref 3.87–5.11)
RDW: 15.5 % (ref 11.5–15.5)
WBC: 10.7 10*3/uL — ABNORMAL HIGH (ref 4.0–10.5)
nRBC: 0 % (ref 0.0–0.2)

## 2020-08-26 LAB — BRAIN NATRIURETIC PEPTIDE: B Natriuretic Peptide: 60.2 pg/mL (ref 0.0–100.0)

## 2020-08-26 LAB — MAGNESIUM: Magnesium: 2.1 mg/dL (ref 1.7–2.4)

## 2020-08-26 LAB — GLUCOSE, CAPILLARY
Glucose-Capillary: 272 mg/dL — ABNORMAL HIGH (ref 70–99)
Glucose-Capillary: 276 mg/dL — ABNORMAL HIGH (ref 70–99)
Glucose-Capillary: 340 mg/dL — ABNORMAL HIGH (ref 70–99)
Glucose-Capillary: 247 mg/dL — ABNORMAL HIGH (ref 70–99)

## 2020-08-26 LAB — COMPREHENSIVE METABOLIC PANEL
ALT: 19 U/L (ref 0–44)
AST: 22 U/L (ref 15–41)
Albumin: 2.7 g/dL — ABNORMAL LOW (ref 3.5–5.0)
Alkaline Phosphatase: 55 U/L (ref 38–126)
Anion gap: 12 (ref 5–15)
BUN: 97 mg/dL — ABNORMAL HIGH (ref 8–23)
CO2: 19 mmol/L — ABNORMAL LOW (ref 22–32)
Calcium: 10.1 mg/dL (ref 8.9–10.3)
Chloride: 109 mmol/L (ref 98–111)
Creatinine, Ser: 1.99 mg/dL — ABNORMAL HIGH (ref 0.44–1.00)
GFR, Estimated: 27 mL/min — ABNORMAL LOW (ref >60)
Glucose, Bld: 295 mg/dL — ABNORMAL HIGH (ref 70–99)
Potassium: 4.1 mmol/L (ref 3.5–5.1)
Sodium: 140 mmol/L (ref 135–145)
Total Bilirubin: 0.3 mg/dL (ref 0.3–1.2)
Total Protein: 6.7 g/dL (ref 6.5–8.1)

## 2020-08-26 LAB — C-REACTIVE PROTEIN: CRP: 4.3 mg/dL — ABNORMAL HIGH (ref <1.0)

## 2020-08-26 LAB — D-DIMER, QUANTITATIVE: D-Dimer, Quant: 1.81 ug/mL-FEU — ABNORMAL HIGH (ref 0.00–0.50)

## 2020-08-26 LAB — PROCALCITONIN: Procalcitonin: 0.8 ng/mL

## 2020-08-26 MED ORDER — METOPROLOL TARTRATE 50 MG PO TABS
50.0000 mg | ORAL_TABLET | Freq: Two times a day (BID) | ORAL | Status: DC
  Administered 2020-08-26 – 2020-08-29 (×7): 50 mg via ORAL
  Filled 2020-08-26 (×7): qty 1

## 2020-08-26 MED ORDER — METHYLPREDNISOLONE SODIUM SUCC 40 MG IJ SOLR
40.0000 mg | Freq: Every day | INTRAMUSCULAR | Status: DC
  Administered 2020-08-27 – 2020-08-28 (×2): 40 mg via INTRAVENOUS
  Filled 2020-08-26 (×2): qty 1

## 2020-08-26 NOTE — Progress Notes (Addendum)
PROGRESS NOTE                                                                                                                                                                                                             Patient Demographics:    Candace [name], is a 69 y.o. female, DOB - 1951/09/29, SLH:[id number]  Outpatient Primary MD for the patient is Patient, No Pcp Per    LOS - 3  Admit date - 08/23/2020    CC -      Brief Narrative (HPI from H&P)   Candace [name] is a 69 y.o. female with medical history significant of hypertension, hyperlipidemia, diabetes mellitus, who comes to the hospital from urgent care due to hypotension as well as weakness.  Patient has been having generalized weakness, intermittent fever and chills for the past 7 to 8 days, she went to an urgent care on 10/21 when she tested positive for Covid, subsequently felt weak, eventually presented to the ER where she was found to have dehydration, AKI, hypoxic respiratory failure due to COVID-19 pneumonia and admitted to the hospital.   Subjective:   Patient in bed, appears comfortable, denies any headache, no fever, no chest pain or pressure, no shortness of breath , no abdominal pain. No focal weakness.   Assessment  & Plan :     1. Acute Hypoxic Resp. Failure due to Acute Covid 19 Viral Pneumonitis during the ongoing 2020 Covid 19 Pandemic - she is unfortunately unvaccinated and seems to have incurred moderate parenchymal lung injury, has been started on IV steroids and Remdesivir.  Has consented for Actemra/Baricitinib use if she gets clinically worse.  Sepsis ruled out.  Encouraged the patient to sit up in chair in the daytime use I-S and flutter valve for pulmonary toiletry and then prone in bed when at night.  Will advance activity and titrate down oxygen as possible.   SpO2: 92 % O2 Flow Rate (L/min): 4 L/min  Recent Labs  Lab 08/23/20 1205  08/23/20 1220 08/23/20 1430 08/23/20 1436 08/24/20 0122 08/24/20 0834 08/25/20 0044 08/26/20 0145  WBC 5.9  --   --   --  5.9  --  8.9 10.7*  HGB 14.5  --   --   --  12.6  --  12.2 12.9  HCT 47.5*  --   --   --  39.8  --  38.1 40.0  PLT 261  --   --   --  211  --  256 287  CRP  --   --   --   --  8.7*  --  6.8* 4.3*  BNP  --   --   --   --   --   --  33.2 60.2  DDIMER 1.44*  --   --   --  1.51*  --  1.82* 1.81*  PROCALCITON 4.83  --   --   --   --  2.28 1.62 0.80  AST 51*  --   --   --  43*  --  26 22  ALT 26  --   --   --  21  --  21 19  ALKPHOS 66  --   --   --  57  --  53 55  BILITOT 0.7  --   --   --  0.5  --  0.8 0.3  ALBUMIN 3.6  --   --   --  2.9*  --  2.6* 2.7*  LATICACIDVEN  --  3.3* 1.1  --   --   --   --   --   SARSCOV2NAA  --   --   --  POSITIVE*  --   --   --   --     2.  Dehydration, hyponatremia with AKI.  Improved with hydration, continue to monitor, nonacute renal ultrasound with mild hydronephrosis likely due to ureteral jets, will require outpatient urology follow-up.  3.  Hypertension.  Pressure improved after IV fluids and hydration, start on Lopressor and monitor.  4.  Elevated D-dimer due to inflammation.  Prophylactic dose heparin, negative leg ultrasound.  5. Dyslipidemia.  On statin.  6.  UTI.  Rocephin x3 days.   7.  History of colon cancer.  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  Outpatient PCP follow-up.    8. DM type II.  On sliding scale.  Add Lantus for better control and monitor.  Lab Results  Component Value Date   HGBA1C 7.0 (H) 08/24/2020   CBG (last 3)  Recent Labs    08/25/20 1648 08/25/20 2132 08/26/20 0738  GLUCAP 301* 284* 272*       Condition -   Guarded  Family Communication  : Called spouse [phone] on 08/24/2020 at 12:05 PM.  Message left.  Son updated via patient's cell phone on 08/25/2020.  Code Status :  Full  Consults  :  None  Procedures  :    Renal US - Cystic changes at the renal hila  likely parapelvic cysts as seen on prior CT. Mild bilateral hydronephrosis is felt less likely as both ureteral jets are seen in the bladder  Leg [place] - No DVT  PUD Prophylaxis : None  Disposition Plan  :    Status is: Inpatient  Remains inpatient appropriate because:IV treatments appropriate due to intensity of illness or inability to take PO   Dispo: The patient is from: Home              Anticipated d/c is to: Home              Anticipated d/c date is: > 3 days              Patient currently is not medically stable to d/c.  DVT Prophylaxis  : We will place on prophylactic Eliquis as cannot use Lovenox  moderate dose due to changing renal insufficiency and heparin seems to be insufficient.  Lab Results  Component Value Date   PLT 287 08/26/2020    Diet :  Diet Order            Diet regular Room service appropriate? Yes; Fluid consistency: Thin  Diet effective now                  Inpatient Medications  Scheduled Meds: . apixaban  2.5 mg Oral BID  . aspirin EC  81 mg Oral Daily  . insulin aspart  0-9 Units Subcutaneous TID WC  . insulin glargine  20 Units Subcutaneous Daily  . linagliptin  5 mg Oral Daily  . methylPREDNISolone (SOLU-MEDROL) injection  60 mg Intravenous Q12H  . metoprolol tartrate  50 mg Oral BID  . rosuvastatin  40 mg Oral Daily   Continuous Infusions: . cefTRIAXone (ROCEPHIN)  IV Stopped (08/25/20 1452)  . remdesivir 100 mg in NS 100 mL 100 mg (08/26/20 0945)   PRN Meds:.acetaminophen, albuterol, chlorpheniramine-HYDROcodone, guaiFENesin-dextromethorphan, [DISCONTINUED] ondansetron **OR** ondansetron (ZOFRAN) IV  Antibiotics  :    Anti-infectives (From admission, onward)   Start     Dose/Rate Route Frequency Ordered Stop   08/25/20 1300  cefTRIAXone (ROCEPHIN) 1 g in sodium chloride 0.9 % 100 mL IVPB        1 g 200 mL/hr over 30 Minutes Intravenous Every 24 hours 08/25/20 1208 08/28/20 1259   08/24/20 1000  remdesivir 100 mg in sodium  chloride 0.9 % 100 mL IVPB  Status:  Discontinued       "Followed by" Linked Group Details   100 mg 200 mL/hr over 30 Minutes Intravenous Daily 08/23/20 1434 08/23/20 1435   08/24/20 1000  remdesivir 100 mg in sodium chloride 0.9 % 100 mL IVPB       "Followed by" Linked Group Details   100 mg 200 mL/hr over 30 Minutes Intravenous Daily 08/23/20 1426 08/28/20 0959   08/23/20 1434  remdesivir 200 mg in sodium chloride 0.9% 250 mL IVPB  Status:  Discontinued       "Followed by" Linked Group Details   200 mg 580 mL/hr over 30 Minutes Intravenous Once 08/23/20 1434 08/23/20 1435   08/23/20 1430  remdesivir 200 mg in sodium chloride 0.9% 250 mL IVPB       "Followed by" Linked Group Details   200 mg 580 mL/hr over 30 Minutes Intravenous Once 08/23/20 1426 08/23/20 1702       Time Spent in minutes  30   Lala Lund M.D on 08/26/2020 at 11:00 AM  To page go to [email or web address] - password [facility]  [facility] -  Office  [phone]   See all Orders from today for further details    Objective:   Vitals:   08/25/20 1300 08/25/20 2125 08/26/20 0400 08/26/20 0736  BP: 118/66 (!) 109/56 121/68 123/73  Pulse: 100 (!) 103 (!) 104 100  Resp: 20 20 20 20   Temp: 98.2 F (36.8 C) 98 F (36.7 C) 98.3 F (36.8 C) 98 F (36.7 C)  TempSrc: Oral Oral Oral Oral  SpO2: 92% 90% 90% 92%  Weight:      Height:        Wt Readings from Last 3 Encounters:  08/23/20 99.8 kg     Intake/Output Summary (Last 24 hours) at 08/26/2020 1100 Last data filed at 08/26/2020 0948 Gross per 24 hour  Intake 805.33 ml  Output 2 ml  Net 803.33 ml  Physical Exam  Awake Alert, No new F.N deficits, Normal affect [place].AT,PERRAL Supple Neck,No JVD, No cervical lymphadenopathy appriciated.  Symmetrical Chest wall movement, Good air movement bilaterally, CTAB RRR,No Gallops, Rubs or new Murmurs, No Parasternal Heave +ve B.Sounds, Abd Soft, No tenderness, No organomegaly appriciated, No rebound -  guarding or rigidity.  Colostomy bag in place. No Cyanosis, Clubbing or edema, No new Rash or bruise    Data Review:    CBC Recent Labs  Lab 08/23/20 1205 08/24/20 0122 08/25/20 0044 08/26/20 0145  WBC 5.9 5.9 8.9 10.7*  HGB 14.5 12.6 12.2 12.9  HCT 47.5* 39.8 38.1 40.0  PLT 261 211 256 287  MCV 73.9* 72.5* 71.2* 71.6*  MCH 22.6* 23.0* 22.8* 23.1*  MCHC 30.5 31.7 32.0 32.3  RDW 16.1* 15.2 15.4 15.5  LYMPHSABS 0.5* 0.3* 0.0* 0.3*  MONOABS 0.9 0.3 1.2* 1.1*  EOSABS 0.0 0.0 0.0 0.0  BASOSABS 0.0 0.0 0.0 0.0    Recent Labs  Lab 08/23/20 1205 08/23/20 1220 08/23/20 1430 08/24/20 0122 08/24/20 0834 08/24/20 1343 08/25/20 0044 08/26/20 0145  NA 131*  --   --  134*  --   --  140 140  K 4.4  --   --  5.4*  --   --  4.4 4.1  CL 94*  --   --  105  --   --  110 109  CO2 18*  --   --  14*  --   --  18* 19*  GLUCOSE 171*  --   --  216*  --   --  230* 295*  BUN 70*  --   --  80*  --   --  97* 97*  CREATININE 5.62*  --   --  3.63*  --   --  2.56* 1.99*  CALCIUM 10.2  --   --  9.1  --   --  9.4 10.1  AST 51*  --   --  43*  --   --  26 22  ALT 26  --   --  21  --   --  21 19  ALKPHOS 66  --   --  57  --   --  53 55  BILITOT 0.7  --   --  0.5  --   --  0.8 0.3  ALBUMIN 3.6  --   --  2.9*  --   --  2.6* 2.7*  MG  --   --   --   --  2.3  --  2.4 2.1  CRP  --   --   --  8.7*  --   --  6.8* 4.3*  DDIMER 1.44*  --   --  1.51*  --   --  1.82* 1.81*  PROCALCITON 4.83  --   --   --  2.28  --  1.62 0.80  LATICACIDVEN  --  3.3* 1.1  --   --   --   --   --   HGBA1C  --   --   --   --   --  7.0*  --   --   BNP  --   --   --   --   --   --  33.2 60.2    ------------------------------------------------------------------------------------------------------------------ Recent Labs    08/23/20 1205  TRIG 239*    Lab Results  Component Value Date   HGBA1C 7.0 (H) 08/24/2020    ------------------------------------------------------------------------------------------------------------------ No results for input(s):  TSH, T4TOTAL, T3FREE, THYROIDAB in the last 72 hours.  Invalid input(s): FREET3  Cardiac Enzymes No results for input(s): CKMB, TROPONINI, MYOGLOBIN in the last 168 hours.  Invalid input(s): CK ------------------------------------------------------------------------------------------------------------------    Component Value Date/Time   BNP 60.2 08/26/2020 0145    Micro Results Recent Results (from the past 240 hour(s))  Blood Culture (routine x 2)     Status: None (Preliminary result)   Collection Time: 08/23/20 11:23 AM   Specimen: BLOOD RIGHT ARM  Result Value Ref Range Status   Specimen Description BLOOD RIGHT ARM  Final   Special Requests   Final    BOTTLES DRAWN AEROBIC ONLY Blood Culture adequate volume   Culture   Final    NO GROWTH 3 DAYS Performed at [facility], [address] [place], [place] [address]    Report Status PENDING  Incomplete  Blood Culture (routine x 2)     Status: None (Preliminary result)   Collection Time: 08/23/20 11:53 AM   Specimen: BLOOD RIGHT HAND  Result Value Ref Range Status   Specimen Description BLOOD RIGHT HAND  Final   Special Requests   Final    BOTTLES DRAWN AEROBIC AND ANAEROBIC Blood Culture results may not be optimal due to an inadequate volume of blood received in culture bottles   Culture   Final    NO GROWTH 3 DAYS Performed at [facility], [place] [address] [place], [place] [address]    Report Status PENDING  Incomplete  Resp Panel by RT PCR (RSV, Flu A&B, Covid) - Nasopharyngeal Swab     Status: Abnormal   Collection Time: 08/23/20  2:36 PM   Specimen: Nasopharyngeal Swab  Result Value Ref Range Status   SARS Coronavirus 2 by RT PCR POSITIVE (A) NEGATIVE Final    Comment: RESULT CALLED TO, READ BACK BY AND VERIFIED WITH: A COLEMAN RN 08/23/20 AT 1740  SK (NOTE) SARS-CoV-2 target nucleic acids are DETECTED.  SARS-CoV-2 RNA is generally detectable in upper respiratory specimens  during the acute phase of infection. Positive results are indicative of the presence of the identified virus, but do not rule out bacterial infection or co-infection with other pathogens not detected by the test. Clinical correlation with patient history and other diagnostic information is necessary to determine patient infection status. The expected result is Negative.  Fact Sheet for Patients:  PinkCheek.be  Fact Sheet for Healthcare Providers: GravelBags.it  This test is not yet approved or cleared by the [place] FDA and  has been authorized for detection and/or diagnosis of SARS-CoV-2 by FDA under an Emergency Use Authorization (EUA).  This EUA will remain in effect (meaning this test can be [place] ed) for the duration of  the COVID-19 declaration under Section 564(b)(1) of the Act, 21 U.S.C. section 360bbb-3(b)(1), unless the authorization is terminated or revoked sooner.      Influenza A by PCR NEGATIVE NEGATIVE Final   Influenza B by PCR NEGATIVE NEGATIVE Final    Comment: (NOTE) The Xpert Xpress SARS-CoV-2/FLU/RSV assay is intended as an aid in  the diagnosis of influenza from Nasopharyngeal swab specimens and  should not be used as a sole basis for treatment. Nasal washings and  aspirates are unacceptable for Xpert Xpress SARS-CoV-2/FLU/RSV  testing.  Fact Sheet for Patients: PinkCheek.be  Fact Sheet for Healthcare Providers: GravelBags.it  This test is not yet approved or cleared by the [place] FDA and  has been authorized for detection and/or diagnosis of SARS-CoV-2  by  FDA under an Emergency Use Authorization (EUA). This EUA will remain  in effect (meaning this test can be used) for the duration of the  Covid-19  declaration under Section 564(b)(1) of the Act, 21  U.S.C. section 360bbb-3(b)(1), unless the authorization is  terminated or revoked.    Respiratory Syncytial Virus by PCR NEGATIVE NEGATIVE Final    Comment: (NOTE) Fact Sheet for Patients: PinkCheek.be  Fact Sheet for Healthcare Providers: GravelBags.it  This test is not yet approved or cleared by the [place] FDA and  has been authorized for detection and/or diagnosis of SARS-CoV-2 by  FDA under an Emergency Use Authorization (EUA). This EUA will remain  in effect (meaning this test can be used) for the duration of the  COVID-19 declaration under Section 564(b)(1) of the Act, 21 U.S.C.  section 360bbb-3(b)(1), unless the authorization is terminated or  revoked. Performed at [facility], [place] [address], [place], [place] [address]   MRSA PCR Screening     Status: None   Collection Time: 08/24/20  8:50 AM   Specimen: Nasal Mucosa; Nasopharyngeal  Result Value Ref Range Status   MRSA by PCR NEGATIVE NEGATIVE Final    Comment:        The GeneXpert MRSA Assay (FDA approved for NASAL specimens only), is one component of a comprehensive MRSA colonization surveillance program. It is not intended to diagnose MRSA infection nor to guide or monitor treatment for MRSA infections. Performed at [facility], [place] [address], [place], [place] [address]     Radiology Reports US RENAL  Result Date: 08/25/2020 CLINICAL DATA:  Acute kidney injury. EXAM: RENAL / URINARY TRACT ULTRASOUND COMPLETE COMPARISON:  Abdominal CT 06/07/2014 FINDINGS: Right Kidney: Renal measurements: 10.7 x 5.2 x 5.0 cm = volume: 146 mL. Cystic changes at the renal hila likely parapelvic cysts as seen on prior CT. Normal renal parenchymal echogenicity. No shadowing calculus. No solid renal mass. Left Kidney: Renal measurements: 10.7 x 6.2 x 5.7 cm = volume: 197 mL. Cystic changes at the renal  hila likely parapelvic cyst is seen on prior CT. Normal renal parenchymal echogenicity. No shadowing calculus. No solid renal mass. Bladder: Appears normal for degree of bladder distention. Both ureteral jets are seen. Other: None. IMPRESSION: Cystic changes at the renal hila likely parapelvic cysts as seen on prior CT. Mild bilateral hydronephrosis is felt less likely as both ureteral jets are seen in the bladder. Electronically Signed   By: Keith Rake M.D.   On: 08/25/2020 01:55   DG Chest Port 1 View  Result Date: 08/25/2020 CLINICAL DATA:  Shortness of breath.  COVID-19 positive. EXAM: PORTABLE CHEST 1 VIEW COMPARISON:  August 23, 2020. FINDINGS: Stable cardiomediastinal silhouette. No pneumothorax is noted. Stable bilateral lung opacities are noted concerning for multifocal pneumonia. Bony thorax is unremarkable. IMPRESSION: Stable bilateral lung opacities are noted concerning for multifocal pneumonia. Electronically Signed   By: Marijo Conception M.D.   On: 08/25/2020 09:36   DG Chest Port 1 View  Result Date: 08/23/2020 CLINICAL DATA:  Shortness of breath EXAM: PORTABLE CHEST 1 VIEW COMPARISON:  None. FINDINGS: There is ill-defined airspace opacity in portions of each mid and lower lung region. Heart size and pulmonary vascularity are normal. No adenopathy. There is degenerative change in the left shoulder. IMPRESSION: Ill-defined airspace opacity in the mid and lower lung regions, most indicative of multifocal pneumonia. Question atypical organism pneumonia given this appearance. Check of COVID-19 status in this regard  advised. Heart size within normal limits.  No adenopathy appreciable. Electronically Signed   By: Lowella Grip III M.D.   On: 08/23/2020 11:33   VAS [place] LOWER EXTREMITY VENOUS (DVT)  Result Date: 08/24/2020  Lower Venous DVTStudy Indications: Elevated d-dimer, covid.  Comparison Study: No prior studies. Performing Technologist: Darlin Coco  Examination Guidelines: A  complete evaluation includes B-mode imaging, spectral Doppler, color Doppler, and power Doppler as needed of all accessible portions of each vessel. Bilateral testing is considered an integral part of a complete examination. Limited examinations for reoccurring indications may be performed as noted. The reflux portion of the exam is performed with the patient in reverse Trendelenburg.  +---------+---------------+---------+-----------+----------+--------------+ RIGHT    CompressibilityPhasicitySpontaneityPropertiesThrombus Aging +---------+---------------+---------+-----------+----------+--------------+ CFV      Full           Yes      Yes                                 +---------+---------------+---------+-----------+----------+--------------+ SFJ      Full                                                        +---------+---------------+---------+-----------+----------+--------------+ FV Prox  Full                                                        +---------+---------------+---------+-----------+----------+--------------+ FV Mid   Full                                                        +---------+---------------+---------+-----------+----------+--------------+ FV DistalFull                                                        +---------+---------------+---------+-----------+----------+--------------+ PFV      Full                                                        +---------+---------------+---------+-----------+----------+--------------+ POP      Full           Yes      Yes                                 +---------+---------------+---------+-----------+----------+--------------+ PTV      Full                                                        +---------+---------------+---------+-----------+----------+--------------+  PERO     Full                                                         +---------+---------------+---------+-----------+----------+--------------+   +---------+---------------+---------+-----------+----------+--------------+ LEFT     CompressibilityPhasicitySpontaneityPropertiesThrombus Aging +---------+---------------+---------+-----------+----------+--------------+ CFV      Full           Yes      Yes                                 +---------+---------------+---------+-----------+----------+--------------+ SFJ      Full                                                        +---------+---------------+---------+-----------+----------+--------------+ FV Prox  Full                                                        +---------+---------------+---------+-----------+----------+--------------+ FV Mid   Full                                                        +---------+---------------+---------+-----------+----------+--------------+ FV DistalFull                                                        +---------+---------------+---------+-----------+----------+--------------+ PFV      Full                                                        +---------+---------------+---------+-----------+----------+--------------+ POP      Full           Yes      Yes                                 +---------+---------------+---------+-----------+----------+--------------+ PTV      Full                                                        +---------+---------------+---------+-----------+----------+--------------+ PERO     Full                                                        +---------+---------------+---------+-----------+----------+--------------+  Summary: RIGHT: - There is no evidence of deep vein thrombosis in the lower extremity.  - No cystic structure found in the popliteal fossa.  LEFT: - There is no evidence of deep vein thrombosis in the lower extremity.  - No cystic structure found in the popliteal fossa.   *See table(s) above for measurements and observations. Electronically signed by Deitra Mayo MD on 08/24/2020 at 5:36:23 PM.    Final

## 2020-08-26 NOTE — Progress Notes (Addendum)
Physical Therapy Treatment Patient Details Name: Candace [name] MRN: [id number] DOB: Oct 08, 1951 Today's Date: 08/26/2020    History of Present Illness 69 y.o. female with medical history significant of hypertension, hyperlipidemia, diabetes mellitus, and anal cancer with colostomy who comes to the hospital from urgent care due to hypotension as well as weakness. Tested +COVID 10/21 at Urgent care; unvaccinated.     PT Comments    Patient demonstrating decr cognition--on arrival found patient lying flat in bed with lines tangled around her after she put herself back to bed without calling for assist. On 4L her sats were 88% and HR 118. Required elevating HOB, incr to 6L, and increased time for sats to rise and HR to decr to 110. Educated on use of IS with pt completing 5 breaths with max cues and only able to pull 250-500 ml. Educated on benefits of being upright/sitting and pt continued to refuse getting back to the chair. Agreed to sit EOB and get untangled from lines. HR up to 128 bpm in sitting and assisted back to bed and placed in chair-like position with HOB 48 degrees. Chair alarm pad placed in chair for patient's next transfer Candace [name]. NT made aware she needs the actual alarm (green box) placed in her room.   Discharge plan updated to SNF based on OTs experience yesterday and today's display of decr cognition and decr cardiopulmonary stability.   Follow Up Recommendations  Supervision/Assistance - 24 hour;SNF     Equipment Recommendations  Other (comment) (TBD next venue)    Recommendations for Other Services       Precautions / Restrictions Precautions Precautions: Fall Precaution Comments: baseline colostomy    Mobility  Bed Mobility Overal bed mobility: Needs Assistance Bed Mobility: Supine to Sit     Supine to sit: Min assist Sit to supine: Min guard   General bed mobility comments: assist for trunk to come into sitting and assist to guide LEs back onto bed due to  inattention to her lines  Transfers Overall transfer level: Needs assistance   Transfers: Lateral/Scoot Transfers          Lateral/Scoot Transfers: Min guard General transfer comment: pt stated too tired to stand and HR elevated to 122; able to laterally scoot towards HOB in sitting with guarding for safety/lines  Ambulation/Gait                 Stairs             Wheelchair Mobility    Modified Rankin (Stroke Patients Only)       Balance Overall balance assessment: Needs assistance Sitting-balance support: No upper extremity supported;Feet supported Sitting balance-Candace [name] Scale: Fair                                      Cognition Arousal/Alertness: Awake/alert Behavior During Therapy: Flat affect Overall Cognitive Status: Impaired/Different from baseline Area of Impairment: Attention;Following commands;Safety/judgement;Awareness;Problem solving;Orientation                 Orientation Level: Time (not day +month) Current Attention Level: Sustained   Following Commands: Follows one step commands with increased time Safety/Judgement: Decreased awareness of safety;Decreased awareness of deficits Awareness: Intellectual Problem Solving: Slow processing;Decreased initiation;Requires verbal cues General Comments: pt had moved herself recliner to bed with lines tangled around her: remains very flat; denies cognitive changes yet asking for cold water when just handed cup of ice water (  unaware it was in her hand)      Exercises Other Exercises Other Exercises: Educated on use of IS with pt pulling only 250-500 ml x 5 reps. Difficulty attending to task    General Comments General comments (skin integrity, edema, etc.): pt lying flat with sats 88% on 4L with HR 118 after putting herself back to bed; HOB elevated and turned up to 6L with slow rise in sats to 95% and HR down to 110; assisted to EOB, untangled and HR 128 sats 92% 6L; returned  to supine, then chair position in bed, returned to 4L with sats 96% HR 110. RN made aware and in to see pt      Pertinent Vitals/Pain Pain Assessment: No/denies pain    Home Living                      Prior Function            PT Goals (current goals can now be found in the care plan section) Acute Rehab PT Goals Patient Stated Goal: get better and go home Time For Goal Achievement: 09/07/20 Potential to Achieve Goals: Good Progress towards PT goals: Not progressing toward goals - comment (elevated HR, decr sats)    Frequency    Min 3X/week (may decr if SNF confirmed)      PT Plan Discharge plan needs to be updated    Co-evaluation              AM-PAC PT "6 Clicks" Mobility   Outcome Measure  Help needed turning from your back to your side while in a flat bed without using bedrails?: A Little Help needed moving from lying on your back to sitting on the side of a flat bed without using bedrails?: A Little Help needed moving to and from a bed to a chair (including a wheelchair)?: A Little Help needed standing up from a chair using your arms (e.g., wheelchair or bedside chair)?: A Little Help needed to walk in hospital room?: Total Help needed climbing 3-5 steps with a railing? : Total 6 Click Score: 14    End of Session Equipment Utilized During Treatment: Oxygen Activity Tolerance: Patient limited by fatigue;Treatment limited secondary to medical complications (Comment) (elevated HR) Patient left: in bed;with call bell/phone within reach;with bed alarm set;with nursing/sitter in room Nurse Communication: Mobility status;Other (comment) (got herself back to bed (needs chair alarm); elevated HR, ) PT Visit Diagnosis: Muscle weakness (generalized) (M62.81);Difficulty in walking, not elsewhere classified (R26.2)     Time: 8416-6063 PT Time Calculation (min) (ACUTE ONLY): 34 min  Charges:  $Therapeutic Activity: 8-22 mins $Self Care/Home Management:  8-22                      Candace [name], PT Pager [phone]    Candace [name] 08/26/2020, 12:51 PM

## 2020-08-26 NOTE — Progress Notes (Signed)
Inpatient Diabetes Program Recommendations  AACE/ADA: New Consensus Statement on Inpatient Glycemic Control (2015)  Target Ranges:  Prepandial:   less than 140 mg/dL      Peak postprandial:   less than 180 mg/dL (1-2 hours)      Critically ill patients:  140 - 180 mg/dL   Lab Results  Component Value Date   GLUCAP 276 (H) 08/26/2020   HGBA1C 7.0 (H) 08/24/2020    Review of Glycemic Control  272, 276 mg/dL.  Orders: Lantus 20 units QD Novolog 0-9 units tidwc Tradjenta 5 mg QD  Solumedrol lowered to 40 mg QD  HgbA1C - 7%  Inpatient Diabetes Program Recommendations:     Add Novolog 3 units tidwc for meal coverage insulin if pt eats > 50% meal. Increase Lantus to 22 units QD  Continue to follow.   Thank you. Lorenda Peck, RD, LDN, CDE Inpatient Diabetes Coordinator [phone]

## 2020-08-27 ENCOUNTER — Inpatient Hospital Stay (HOSPITAL_COMMUNITY): Payer: Medicare Other

## 2020-08-27 LAB — COMPREHENSIVE METABOLIC PANEL
ALT: 18 U/L (ref 0–44)
AST: 23 U/L (ref 15–41)
Albumin: 2.7 g/dL — ABNORMAL LOW (ref 3.5–5.0)
Alkaline Phosphatase: 53 U/L (ref 38–126)
Anion gap: 11 (ref 5–15)
BUN: 81 mg/dL — ABNORMAL HIGH (ref 8–23)
CO2: 23 mmol/L (ref 22–32)
Calcium: 10.7 mg/dL — ABNORMAL HIGH (ref 8.9–10.3)
Chloride: 112 mmol/L — ABNORMAL HIGH (ref 98–111)
Creatinine, Ser: 1.53 mg/dL — ABNORMAL HIGH (ref 0.44–1.00)
GFR, Estimated: 37 mL/min — ABNORMAL LOW (ref >60)
Glucose, Bld: 154 mg/dL — ABNORMAL HIGH (ref 70–99)
Potassium: 4.3 mmol/L (ref 3.5–5.1)
Sodium: 146 mmol/L — ABNORMAL HIGH (ref 135–145)
Total Bilirubin: 0.5 mg/dL (ref 0.3–1.2)
Total Protein: 6.7 g/dL (ref 6.5–8.1)

## 2020-08-27 LAB — CBC WITH DIFFERENTIAL/PLATELET
Abs Immature Granulocytes: 0.17 10*3/uL — ABNORMAL HIGH (ref 0.00–0.07)
Basophils Absolute: 0 10*3/uL (ref 0.0–0.1)
Basophils Relative: 0 %
Eosinophils Absolute: 0 10*3/uL (ref 0.0–0.5)
Eosinophils Relative: 0 %
HCT: 40.3 % (ref 36.0–46.0)
Hemoglobin: 12.9 g/dL (ref 12.0–15.0)
Immature Granulocytes: 2 %
Lymphocytes Relative: 4 %
Lymphs Abs: 0.4 10*3/uL — ABNORMAL LOW (ref 0.7–4.0)
MCH: 22.6 pg — ABNORMAL LOW (ref 26.0–34.0)
MCHC: 32 g/dL (ref 30.0–36.0)
MCV: 70.7 fL — ABNORMAL LOW (ref 80.0–100.0)
Monocytes Absolute: 1.7 10*3/uL — ABNORMAL HIGH (ref 0.1–1.0)
Monocytes Relative: 15 %
Neutro Abs: 9 10*3/uL — ABNORMAL HIGH (ref 1.7–7.7)
Neutrophils Relative %: 79 %
Platelets: 335 10*3/uL (ref 150–400)
RBC: 5.7 MIL/uL — ABNORMAL HIGH (ref 3.87–5.11)
RDW: 15.4 % (ref 11.5–15.5)
WBC: 11.3 10*3/uL — ABNORMAL HIGH (ref 4.0–10.5)
nRBC: 0 % (ref 0.0–0.2)

## 2020-08-27 LAB — GLUCOSE, CAPILLARY
Glucose-Capillary: 181 mg/dL — ABNORMAL HIGH (ref 70–99)
Glucose-Capillary: 264 mg/dL — ABNORMAL HIGH (ref 70–99)
Glucose-Capillary: 279 mg/dL — ABNORMAL HIGH (ref 70–99)
Glucose-Capillary: 233 mg/dL — ABNORMAL HIGH (ref 70–99)

## 2020-08-27 LAB — D-DIMER, QUANTITATIVE: D-Dimer, Quant: 2.03 ug/mL-FEU — ABNORMAL HIGH (ref 0.00–0.50)

## 2020-08-27 LAB — MAGNESIUM: Magnesium: 2 mg/dL (ref 1.7–2.4)

## 2020-08-27 LAB — C-REACTIVE PROTEIN: CRP: 3.2 mg/dL — ABNORMAL HIGH (ref <1.0)

## 2020-08-27 LAB — BRAIN NATRIURETIC PEPTIDE: B Natriuretic Peptide: 44.8 pg/mL (ref 0.0–100.0)

## 2020-08-27 LAB — PROCALCITONIN: Procalcitonin: 1.1 ng/mL

## 2020-08-27 MED ORDER — DEXTROSE 5 % IV SOLN: INTRAVENOUS | Status: AC

## 2020-08-27 NOTE — Progress Notes (Signed)
Physical Therapy Treatment Patient Details Name: Candace [name] MRN: [id number] DOB: 1951/03/10 Today's Date: 08/27/2020    History of Present Illness 69 y.o. female with medical history significant of hypertension, hyperlipidemia, diabetes mellitus, and anal cancer with colostomy who comes to the hospital from urgent care due to hypotension as well as weakness. Tested +COVID 10/21 at Urgent care; unvaccinated.     PT Comments    Pt making gradual progress.  She needed frequent cues for initiation, safety, problem solving, and transfer/ambulation technique.  She did ambulated 20'x2 with RW and VSS on 3 LPM O2 requiring min-mod A for transfers.   Continue plan of care.    Follow Up Recommendations  Supervision/Assistance - 24 hour;SNF     Equipment Recommendations  Rolling walker with 5" wheels;Other (comment) (further assessment next venue)    Recommendations for Other Services       Precautions / Restrictions Precautions Precautions: Fall Precaution Comments: baseline colostomy    Mobility  Bed Mobility Overal bed mobility: Needs Assistance Bed Mobility: Sit to Supine       Sit to supine: Min guard   General bed mobility comments: Min Guard A for safety  Transfers Overall transfer level: Needs assistance Equipment used: Rolling walker (2 wheeled) Transfers: Sit to/from Stand Sit to Stand: Min assist;Mod assist         General transfer comment: Min A to power up from recliner. Cues for hand placement. Pt requiring Mod A to power up from low toilet.   Ambulation/Gait Ambulation/Gait assistance: Min assist Gait Distance (Feet): 20 Feet (20'x2) Assistive device: Rolling walker (2 wheeled) Gait Pattern/deviations: Step-through pattern;Decreased stride length;Trunk flexed Gait velocity: decreased   General Gait Details: Ambulated to bathroom and back.  Required cues and assist for RW adn RW proximity, posture, and safety.  Also, required cues for  initiation   Stairs             Wheelchair Mobility    Modified Rankin (Stroke Patients Only)       Balance Overall balance assessment: Needs assistance Sitting-balance support: No upper extremity supported;Feet supported Sitting balance-Leahy Scale: Fair     Standing balance support: Bilateral upper extremity supported;During functional activity Standing balance-Leahy Scale: Poor Standing balance comment: Reliant on UE support                            Cognition Arousal/Alertness: Awake/alert Behavior During Therapy: Flat affect Overall Cognitive Status: Impaired/Different from baseline Area of Impairment: Attention;Following commands;Safety/judgement;Awareness;Problem solving;Orientation                 Orientation Level: Time (not day +month) Current Attention Level: Sustained   Following Commands: Follows one step commands with increased time Safety/Judgement: Decreased awareness of safety;Decreased awareness of deficits Awareness: Intellectual Problem Solving: Slow processing;Decreased initiation;Requires verbal cues General Comments: Pt continues to present with decreased attention, problems solving, and awareness. When asking pt her name and date of birth, pt repeating her naem and address three times. Pt drinking her coffee and clearly making a face because the coffee was warm/hot. Cueing pt to problem solve it is hot and will need to let it cool. Pt then attempting to take second sip desptie just stating it needed to cool.       Exercises      General Comments General comments (skin integrity, edema, etc.): SpO2 in 90s on 3 L O2 and HR 90's bpm      Pertinent Vitals/Pain Pain  Assessment: No/denies pain    Home Living                      Prior Function            PT Goals (current goals can now be found in the care plan section) Acute Rehab PT Goals Patient Stated Goal: get better and go home PT Goal Formulation:  With patient Time For Goal Achievement: 09/07/20 Potential to Achieve Goals: Good Progress towards PT goals: Progressing toward goals    Frequency    Min 3X/week      PT Plan Current plan remains appropriate    Co-evaluation              AM-PAC PT "6 Clicks" Mobility   Outcome Measure  Help needed turning from your back to your side while in a flat bed without using bedrails?: A Little Help needed moving from lying on your back to sitting on the side of a flat bed without using bedrails?: A Little Help needed moving to and from a bed to a chair (including a wheelchair)?: A Little Help needed standing up from a chair using your arms (e.g., wheelchair or bedside chair)?: A Little Help needed to walk in hospital room?: A Little Help needed climbing 3-5 steps with a railing? : A Lot 6 Click Score: 17    End of Session Equipment Utilized During Treatment: Oxygen;Gait belt Activity Tolerance: Patient tolerated treatment well Patient left: in bed;with call bell/phone within reach;with bed alarm set Nurse Communication: Mobility status;Other (comment) (IV leaking, ?infiltrated, therapy paused IV) PT Visit Diagnosis: Muscle weakness (generalized) (M62.81);Difficulty in walking, not elsewhere classified (R26.2)     Time: 3532-9924 PT Time Calculation (min) (ACUTE ONLY): 29 min  Charges:  $Gait Training: 8-22 mins                     Abran Richard, PT Acute Rehab Services Pager [phone] [facility] 08/27/2020, 5:30 PM

## 2020-08-27 NOTE — NC FL2 (Signed)
[place] MEDICAID FL2 LEVEL OF CARE SCREENING TOOL     IDENTIFICATION  Patient Name: Candace [name] Birthdate: 1951/04/28 Sex: female Admission Date (Current Location): 08/23/2020   [facility] and [place] Number:  Herbalist and Address:  [facility] [place] [address], [place], [place] [address]      Provider Number: [id number]  Attending Physician Name and Address:  Thurnell Lose, MD  Relative Name and Phone Number:  [name] Daughter [phone]    Current Level of Care: Hospital Recommended Level of Care: [place] Prior Approval Number:    Date Approved/Denied:   PASRR Number: [id number]  Discharge Plan: SNF    Current Diagnoses: Patient Active Problem List   Diagnosis Date Noted  . AKI (acute kidney injury) ([place]) 08/23/2020    Orientation RESPIRATION BLADDER Height & Weight     Self, Time, Situation  O2 Continent Weight: 220 lb (99.8 kg) Height:  5\' 8"  (172.7 cm)  BEHAVIORAL SYMPTOMS/MOOD NEUROLOGICAL BOWEL NUTRITION STATUS      Incontinent Diet (Regular diet.  See discharge summary)  AMBULATORY STATUS COMMUNICATION OF NEEDS Skin   Supervision Verbally Surgical wounds                       Personal Care Assistance Level of Assistance  Bathing, Feeding, Dressing Bathing Assistance: Limited assistance Feeding assistance: Independent Dressing Assistance: Limited assistance     Functional Limitations Info  Sight, Hearing, Speech Sight Info: Adequate Hearing Info: Adequate Speech Info: Adequate    SPECIAL CARE FACTORS FREQUENCY  PT (By licensed PT), OT (By licensed OT)     PT Frequency: 5x week OT Frequency: 5x week            Contractures Contractures Info: Not present    Additional Factors Info  Code Status, Allergies, Insulin Sliding Scale Code Status Info: full Allergies Info: NKA   Insulin Sliding Scale Info: novolog, 0-9 units 3x day with meals.       Current Medications  (08/27/2020):  This is the current hospital active medication list Current Facility-Administered Medications  Medication Dose Route Frequency Provider Last Rate Last Admin  . acetaminophen (TYLENOL) tablet 1,000 mg  1,000 mg Oral Q6H PRN Gherghe, Costin M, MD      . albuterol (VENTOLIN HFA) 108 (90 Base) MCG/ACT inhaler 2 puff  2 puff Inhalation Q4H PRN Caren Griffins, MD      . apixaban (ELIQUIS) tablet 2.5 mg  2.5 mg Oral BID Thurnell Lose, MD   2.5 mg at 08/27/20 0933  . aspirin EC tablet 81 mg  81 mg Oral Daily Caren Griffins, MD   81 mg at 08/27/20 0933  . cefTRIAXone (ROCEPHIN) 1 g in sodium chloride 0.9 % 100 mL IVPB  1 g Intravenous Q24H Thurnell Lose, MD   Stopping Infusion hung by another clincian at 08/26/20 2053  . chlorpheniramine-HYDROcodone (TUSSIONEX) 10-8 MG/5ML suspension 5 mL  5 mL Oral Q12H PRN Gherghe, Costin M, MD      . dextrose 5 % solution   Intravenous Continuous Lala Lund K, MD      . guaiFENesin-dextromethorphan (ROBITUSSIN DM) 100-10 MG/5ML syrup 10 mL  10 mL Oral Q4H PRN Caren Griffins, MD      . insulin aspart (novoLOG) injection 0-9 Units  0-9 Units Subcutaneous TID WC Caren Griffins, MD   1 Units at 08/27/20 0933  . insulin glargine (LANTUS) injection 20 Units  20 Units  Subcutaneous Daily Thurnell Lose, MD   20 Units at 08/27/20 [id number]  . linagliptin (TRADJENTA) tablet 5 mg  5 mg Oral Daily Caren Griffins, MD   5 mg at 08/27/20 0934  . methylPREDNISolone sodium succinate (SOLU-MEDROL) 40 mg/mL injection 40 mg  40 mg Intravenous Daily Thurnell Lose, MD   40 mg at 08/27/20 0934  . metoprolol tartrate (LOPRESSOR) tablet 50 mg  50 mg Oral BID Thurnell Lose, MD   50 mg at 08/27/20 0933  . ondansetron (ZOFRAN) injection 4 mg  4 mg Intravenous Q6H PRN Caren Griffins, MD      . rosuvastatin (CRESTOR) tablet 40 mg  40 mg Oral Daily Caren Griffins, MD   40 mg at 08/27/20 2890     Discharge Medications: Please see discharge  summary for a list of discharge medications.  Relevant Imaging Results:  Relevant Lab Results:   Additional Information SSN [id number]  Joanne Chars, LCSW

## 2020-08-27 NOTE — TOC Initial Note (Signed)
Transition of Care [facility]) - Initial/Assessment Note    Patient Details  Name: Candace [name] MRN: [id number] Date of Birth: 1951/02/16  [facility] CM/SW Contact:    Candace Chars, LCSW Phone Number: 08/27/2020, 2:09 PM  Clinical Narrative:     CSW spoke with pt by phone due to covid +.  Pt agrees to referral for SNF.  Pt reports she lives with her husband but he has alzheimers, daughter [name] is best contact.  Permission given to talk to daughter and send out info for SNF.  Pt is not vaccinated.           Expected Discharge Plan: Skilled Nursing Facility Barriers to Discharge: Continued Medical Work up, SNF Pending bed offer   Patient Goals and CMS Choice Patient states their goals for this hospitalization and ongoing recovery are:: get over covid CMS Medicare.gov Compare Post Acute Care list provided to::  (covid positive-[facility] and [facility] are only options currently)    Expected Discharge Plan and Services Expected Discharge Plan: [place] arrangements for the past 2 months: Single Family Home                                      Prior Living Arrangements/Services Living arrangements for the past 2 months: Single Family Home Lives with:: Spouse, Adult Children Patient language and need for interpreter reviewed:: Yes Do you feel safe going back to the place where you live?: Yes      Need for Family Participation in Patient Care: Yes (Comment)   Current home services: Other (comment) (none) Criminal Activity/Legal Involvement Pertinent to Current Situation/Hospitalization: No - Comment as needed  Activities of Daily Living Home Assistive Devices/Equipment: None ADL Screening (condition at time of admission) Patient's cognitive ability adequate to safely complete daily activities?: Yes Is the patient deaf or have difficulty hearing?: No Does the patient have difficulty seeing, even when wearing glasses/contacts?:  No Does the patient have difficulty concentrating, remembering, or making decisions?: No Patient able to express need for assistance with ADLs?: No Does the patient have difficulty dressing or bathing?: No Independently performs ADLs?: Yes (appropriate for developmental age) Does the patient have difficulty walking or climbing stairs?: No Weakness of Legs: None Weakness of Arms/Hands: None  Permission Sought/Granted Permission sought to share information with : Family Supports, Chartered certified accountant granted to share information with : Yes, Verbal Permission Granted  Share Information with NAME: daughter [name]  Permission granted to share info w AGENCY: SNF        Emotional Assessment Appearance::  (assessment by phone: covid +) Attitude/Demeanor/Rapport: Engaged Affect (typically observed): Appropriate, Pleasant Orientation: : Oriented to Self, Oriented to Place, Oriented to  Time, Oriented to Situation Alcohol / Substance Use: Not Applicable Psych Involvement: No (comment)  Admission diagnosis:  Dehydration, severe [E86.0] AKI (acute kidney injury) ([place]) [N17.9] [id number] non-replicating viral vector vaccine contraindicated [Z28.09] Patient Active Problem List   Diagnosis Date Noted  . AKI (acute kidney injury) ([place]) 08/23/2020   PCP:  Patient, No Pcp Per Pharmacy:   [facility] [address] [place] [address]  [place] [address] Phone: [phone] Fax: [phone]     Social Determinants of Health (SDOH) Interventions    Readmission Risk Interventions No flowsheet data found.

## 2020-08-27 NOTE — Progress Notes (Signed)
PROGRESS NOTE                                                                                                                                                                                                             Patient Demographics:    Candace [name], is a 69 y.o. female, DOB - Jun 01, 1951, VWU:[id number]  Outpatient Primary MD for the patient is Patient, No Pcp Per    LOS - 4  Admit date - 08/23/2020    CC -      Brief Narrative (HPI from H&P)   Candace [name] is a 69 y.o. female with medical history significant of hypertension, hyperlipidemia, diabetes mellitus, who comes to the hospital from urgent care due to hypotension as well as weakness.  Patient has been having generalized weakness, intermittent fever and chills for the past 7 to 8 days, she went to an urgent care on 10/21 when she tested positive for Covid, subsequently felt weak, eventually presented to the ER where she was found to have dehydration, AKI, hypoxic respiratory failure due to COVID-19 pneumonia and admitted to the hospital.   Subjective:   Patient in bed, appears comfortable, denies any headache, no fever, no chest pain or pressure, no shortness of breath , no abdominal pain. No focal weakness.   Assessment  & Plan :     1. Acute Hypoxic Resp. Failure due to Acute Covid 19 Viral Pneumonitis during the ongoing 2020 Covid 19 Pandemic - she is unfortunately unvaccinated and seems to have incurred moderate parenchymal lung injury, has been started on IV steroids and Remdesivir.  Has consented for Actemra/Baricitinib use if she gets clinically worse.  Sepsis ruled out.  Encouraged the patient to sit up in chair in the daytime use I-S and flutter valve for pulmonary toiletry and then prone in bed when at night.  Will advance activity and titrate down oxygen as possible.   SpO2: 94 % O2 Flow Rate (L/min): (S) 3 L/min  Recent Labs  Lab 08/23/20 1205  08/23/20 1220 08/23/20 1430 08/23/20 1436 08/24/20 0122 08/24/20 0834 08/25/20 0044 08/26/20 0145 08/27/20 0038  WBC 5.9  --   --   --  5.9  --  8.9 10.7* 11.3*  HGB 14.5  --   --   --  12.6  --  12.2 12.9 12.9  HCT 47.5*  --   --   --  39.8  --  38.1 40.0 40.3  PLT 261  --   --   --  211  --  256 287 335  CRP  --   --   --   --  8.7*  --  6.8* 4.3* 3.2*  BNP  --   --   --   --   --   --  33.2 60.2 44.8  DDIMER 1.44*  --   --   --  1.51*  --  1.82* 1.81* 2.03*  PROCALCITON 4.83  --   --   --   --  2.28 1.62 0.80 1.10  AST 51*  --   --   --  43*  --  26 22 23   ALT 26  --   --   --  21  --  21 19 18   ALKPHOS 66  --   --   --  57  --  53 55 53  BILITOT 0.7  --   --   --  0.5  --  0.8 0.3 0.5  ALBUMIN 3.6  --   --   --  2.9*  --  2.6* 2.7* 2.7*  LATICACIDVEN  --  3.3* 1.1  --   --   --   --   --   --   SARSCOV2NAA  --   --   --  POSITIVE*  --   --   --   --   --     2.  Dehydration, hyponatremia with AKI.  Improved with hydration, continue to monitor, nonacute renal ultrasound with mild hydronephrosis likely due to ureteral jets, will require outpatient urology follow-up.  3.  Hypertension.  Pressure improved after IV fluids and hydration, start on Lopressor and monitor.  4.  Elevated D-dimer due to inflammation.  Prophylactic dose heparin, negative leg ultrasound.  5. Dyslipidemia.  On statin.  6.  UTI.  Rocephin x3 days.   7.  History of colon cancer.  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  Outpatient PCP follow-up.    8. DM type II.  On sliding scale.  Add Lantus for better control and monitor.  Lab Results  Component Value Date   HGBA1C 7.0 (H) 08/24/2020   CBG (last 3)  Recent Labs    08/26/20 1604 08/26/20 2131 08/27/20 0738  GLUCAP 340* 247* 181*       Condition -   Guarded  Family Communication  :   Called spouse [phone] on 08/24/2020 at 12:05 PM.  Message left  updated daughter [name] [phone]  08/27/20  Son updated via patient's  cell phone on 08/25/2020.   Code Status :  Full  Consults  :  None  Procedures  :    Renal US - Cystic changes at the renal hila likely parapelvic cysts as seen on prior CT. Mild bilateral hydronephrosis is felt less likely as both ureteral jets are seen in the bladder  Leg [place] - No DVT  PUD Prophylaxis : None  Disposition Plan  :    Status is: Inpatient  Remains inpatient appropriate because:IV treatments appropriate due to intensity of illness or inability to take PO  Dispo: The patient is from: Home              Anticipated d/c is to: Home              Anticipated d/c date is: > 3 days  Patient currently is not medically stable to d/c.  DVT Prophylaxis  : We will place on prophylactic Eliquis as cannot use Lovenox moderate dose due to changing renal insufficiency and heparin seems to be insufficient.  Lab Results  Component Value Date   PLT 335 08/27/2020    Diet :  Diet Order            Diet regular Room service appropriate? Yes; Fluid consistency: Thin  Diet effective now                  Inpatient Medications  Scheduled Meds: . apixaban  2.5 mg Oral BID  . aspirin EC  81 mg Oral Daily  . insulin aspart  0-9 Units Subcutaneous TID WC  . insulin glargine  20 Units Subcutaneous Daily  . linagliptin  5 mg Oral Daily  . methylPREDNISolone (SOLU-MEDROL) injection  40 mg Intravenous Daily  . metoprolol tartrate  50 mg Oral BID  . rosuvastatin  40 mg Oral Daily   Continuous Infusions: . cefTRIAXone (ROCEPHIN)  IV Stopped (08/26/20 2053)  . dextrose    . remdesivir 100 mg in NS 100 mL 100 mg (08/27/20 0936)   PRN Meds:.acetaminophen, albuterol, chlorpheniramine-HYDROcodone, guaiFENesin-dextromethorphan, [DISCONTINUED] ondansetron **OR** ondansetron (ZOFRAN) IV  Antibiotics  :    Anti-infectives (From admission, onward)   Start     Dose/Rate Route Frequency Ordered Stop   08/25/20 1300  cefTRIAXone (ROCEPHIN) 1 g in sodium chloride 0.9 % 100 mL  IVPB        1 g 200 mL/hr over 30 Minutes Intravenous Every 24 hours 08/25/20 1208 08/28/20 1259   08/24/20 1000  remdesivir 100 mg in sodium chloride 0.9 % 100 mL IVPB  Status:  Discontinued       "Followed by" Linked Group Details   100 mg 200 mL/hr over 30 Minutes Intravenous Daily 08/23/20 1434 08/23/20 1435   08/24/20 1000  remdesivir 100 mg in sodium chloride 0.9 % 100 mL IVPB       "Followed by" Linked Group Details   100 mg 200 mL/hr over 30 Minutes Intravenous Daily 08/23/20 1426 08/28/20 0959   08/23/20 1434  remdesivir 200 mg in sodium chloride 0.9% 250 mL IVPB  Status:  Discontinued       "Followed by" Linked Group Details   200 mg 580 mL/hr over 30 Minutes Intravenous Once 08/23/20 1434 08/23/20 1435   08/23/20 1430  remdesivir 200 mg in sodium chloride 0.9% 250 mL IVPB       "Followed by" Linked Group Details   200 mg 580 mL/hr over 30 Minutes Intravenous Once 08/23/20 1426 08/23/20 1702       Time Spent in minutes  30   Lala Lund M.D on 08/27/2020 at 9:53 AM  To page go to [email or web address] - password [facility]  [facility] -  Office  [phone]   See all Orders from today for further details    Objective:   Vitals:   08/26/20 1343 08/26/20 2107 08/27/20 0030 08/27/20 0521  BP: 121/71 123/72  108/73  Pulse: 75 92  89  Resp: 20 18  20   Temp: 98 F (36.7 C) 98.1 F (36.7 C)  98.5 F (36.9 C)  TempSrc: Oral Oral  Axillary  SpO2: 100% 95% 96% 94%  Weight:      Height:        Wt Readings from Last 3 Encounters:  08/23/20 99.8 kg     Intake/Output Summary (Last 24 hours) at 08/27/2020 1601  Last data filed at 08/26/2020 2127 Gross per 24 hour  Intake 160 ml  Output 470 ml  Net -310 ml     Physical Exam  Awake Alert, No new F.N deficits, Normal affect [place].AT,PERRAL Supple Neck,No JVD, No cervical lymphadenopathy appriciated.  Symmetrical Chest wall movement, Good air movement bilaterally, CTAB RRR,No Gallops, Rubs or new Murmurs, No  Parasternal Heave +ve B.Sounds, Abd Soft, No tenderness, No organomegaly appriciated, No rebound - guarding or rigidity. Colostomy bag in place. No Cyanosis, Clubbing or edema, No new Rash or bruise     Data Review:    CBC Recent Labs  Lab 08/23/20 1205 08/24/20 0122 08/25/20 0044 08/26/20 0145 08/27/20 0038  WBC 5.9 5.9 8.9 10.7* 11.3*  HGB 14.5 12.6 12.2 12.9 12.9  HCT 47.5* 39.8 38.1 40.0 40.3  PLT 261 211 256 287 335  MCV 73.9* 72.5* 71.2* 71.6* 70.7*  MCH 22.6* 23.0* 22.8* 23.1* 22.6*  MCHC 30.5 31.7 32.0 32.3 32.0  RDW 16.1* 15.2 15.4 15.5 15.4  LYMPHSABS 0.5* 0.3* 0.0* 0.3* 0.4*  MONOABS 0.9 0.3 1.2* 1.1* 1.7*  EOSABS 0.0 0.0 0.0 0.0 0.0  BASOSABS 0.0 0.0 0.0 0.0 0.0    Recent Labs  Lab 08/23/20 1205 08/23/20 1220 08/23/20 1430 08/24/20 0122 08/24/20 0834 08/24/20 1343 08/25/20 0044 08/26/20 0145 08/27/20 0038  NA 131*  --   --  134*  --   --  140 140 146*  K 4.4  --   --  5.4*  --   --  4.4 4.1 4.3  CL 94*  --   --  105  --   --  110 109 112*  CO2 18*  --   --  14*  --   --  18* 19* 23  GLUCOSE 171*  --   --  216*  --   --  230* 295* 154*  BUN 70*  --   --  80*  --   --  97* 97* 81*  CREATININE 5.62*  --   --  3.63*  --   --  2.56* 1.99* 1.53*  CALCIUM 10.2  --   --  9.1  --   --  9.4 10.1 10.7*  AST 51*  --   --  43*  --   --  26 22 23   ALT 26  --   --  21  --   --  21 19 18   ALKPHOS 66  --   --  57  --   --  53 55 53  BILITOT 0.7  --   --  0.5  --   --  0.8 0.3 0.5  ALBUMIN 3.6  --   --  2.9*  --   --  2.6* 2.7* 2.7*  MG  --   --   --   --  2.3  --  2.4 2.1 2.0  CRP  --   --   --  8.7*  --   --  6.8* 4.3* 3.2*  DDIMER 1.44*  --   --  1.51*  --   --  1.82* 1.81* 2.03*  PROCALCITON 4.83  --   --   --  2.28  --  1.62 0.80 1.10  LATICACIDVEN  --  3.3* 1.1  --   --   --   --   --   --   HGBA1C  --   --   --   --   --  7.0*  --   --   --  BNP  --   --   --   --   --   --  33.2 60.2 44.8     ------------------------------------------------------------------------------------------------------------------ No results for input(s): CHOL, HDL, LDLCALC, TRIG, CHOLHDL, LDLDIRECT in the last 72 hours.  Lab Results  Component Value Date   HGBA1C 7.0 (H) 08/24/2020   ------------------------------------------------------------------------------------------------------------------ No results for input(s): TSH, T4TOTAL, T3FREE, THYROIDAB in the last 72 hours.  Invalid input(s): FREET3  Cardiac Enzymes No results for input(s): CKMB, TROPONINI, MYOGLOBIN in the last 168 hours.  Invalid input(s): CK ------------------------------------------------------------------------------------------------------------------    Component Value Date/Time   BNP 44.8 08/27/2020 0038    Micro Results Recent Results (from the past 240 hour(s))  Blood Culture (routine x 2)     Status: None (Preliminary result)   Collection Time: 08/23/20 11:23 AM   Specimen: BLOOD RIGHT ARM  Result Value Ref Range Status   Specimen Description BLOOD RIGHT ARM  Final   Special Requests   Final    BOTTLES DRAWN AEROBIC ONLY Blood Culture adequate volume   Culture   Final    NO GROWTH 3 DAYS Performed at [facility], [address], [place], [place] [address]    Report Status PENDING  Incomplete  Blood Culture (routine x 2)     Status: None (Preliminary result)   Collection Time: 08/23/20 11:53 AM   Specimen: BLOOD RIGHT HAND  Result Value Ref Range Status   Specimen Description BLOOD RIGHT HAND  Final   Special Requests   Final    BOTTLES DRAWN AEROBIC AND ANAEROBIC Blood Culture results may not be optimal due to an inadequate volume of blood received in culture bottles   Culture   Final    NO GROWTH 3 DAYS Performed at [facility], [place] [address] [place], [place] [address]    Report Status PENDING  Incomplete  Resp Panel by RT PCR (RSV, Flu A&B, Covid) - Nasopharyngeal Swab     Status:  Abnormal   Collection Time: 08/23/20  2:36 PM   Specimen: Nasopharyngeal Swab  Result Value Ref Range Status   SARS Coronavirus 2 by RT PCR POSITIVE (A) NEGATIVE Final    Comment: RESULT CALLED TO, READ BACK BY AND VERIFIED WITH: A COLEMAN RN 08/23/20 AT 1740 SK (NOTE) SARS-CoV-2 target nucleic acids are DETECTED.  SARS-CoV-2 RNA is generally detectable in upper respiratory specimens  during the acute phase of infection. Positive results are indicative of the presence of the identified virus, but do not rule out bacterial infection or co-infection with other pathogens not detected by the test. Clinical correlation with patient history and other diagnostic information is necessary to determine patient infection status. The expected result is Negative.  Fact Sheet for Patients:  PinkCheek.be  Fact Sheet for Healthcare Providers: GravelBags.it  This test is not yet approved or cleared by the [place] FDA and  has been authorized for detection and/or diagnosis of SARS-CoV-2 by FDA under an Emergency Use Authorization (EUA).  This EUA will remain in effect (meaning this test can be [place] ed) for the duration of  the COVID-19 declaration under Section 564(b)(1) of the Act, 21 U.S.C. section 360bbb-3(b)(1), unless the authorization is terminated or revoked sooner.      Influenza A by PCR NEGATIVE NEGATIVE Final   Influenza B by PCR NEGATIVE NEGATIVE Final    Comment: (NOTE) The Xpert Xpress SARS-CoV-2/FLU/RSV assay is intended as an aid in  the diagnosis of influenza from Nasopharyngeal swab specimens and  should not be used as a sole basis for treatment. Nasal washings and  aspirates are unacceptable for Xpert Xpress SARS-CoV-2/FLU/RSV  testing.  Fact Sheet for Patients: PinkCheek.be  Fact Sheet for Healthcare Providers: GravelBags.it  This test is not yet  approved or cleared by the [place] FDA and  has been authorized for detection and/or diagnosis of SARS-CoV-2 by  FDA under an Emergency Use Authorization (EUA). This EUA will remain  in effect (meaning this test can be used) for the duration of the  Covid-19 declaration under Section 564(b)(1) of the Act, 21  U.S.C. section 360bbb-3(b)(1), unless the authorization is  terminated or revoked.    Respiratory Syncytial Virus by PCR NEGATIVE NEGATIVE Final    Comment: (NOTE) Fact Sheet for Patients: PinkCheek.be  Fact Sheet for Healthcare Providers: GravelBags.it  This test is not yet approved or cleared by the [place] FDA and  has been authorized for detection and/or diagnosis of SARS-CoV-2 by  FDA under an Emergency Use Authorization (EUA). This EUA will remain  in effect (meaning this test can be used) for the duration of the  COVID-19 declaration under Section 564(b)(1) of the Act, 21 U.S.C.  section 360bbb-3(b)(1), unless the authorization is terminated or  revoked. Performed at [facility], [place] [address], [place], [place] [address]   MRSA PCR Screening     Status: None   Collection Time: 08/24/20  8:50 AM   Specimen: Nasal Mucosa; Nasopharyngeal  Result Value Ref Range Status   MRSA by PCR NEGATIVE NEGATIVE Final    Comment:        The GeneXpert MRSA Assay (FDA approved for NASAL specimens only), is one component of a comprehensive MRSA colonization surveillance program. It is not intended to diagnose MRSA infection nor to guide or monitor treatment for MRSA infections. Performed at [facility], [place] [address], [place], [place] [address]     Radiology Reports US RENAL  Result Date: 08/25/2020 CLINICAL DATA:  Acute kidney injury. EXAM: RENAL / URINARY TRACT ULTRASOUND COMPLETE COMPARISON:  Abdominal CT 06/07/2014 FINDINGS: Right Kidney: Renal measurements: 10.7 x 5.2 x 5.0 cm = volume:  146 mL. Cystic changes at the renal hila likely parapelvic cysts as seen on prior CT. Normal renal parenchymal echogenicity. No shadowing calculus. No solid renal mass. Left Kidney: Renal measurements: 10.7 x 6.2 x 5.7 cm = volume: 197 mL. Cystic changes at the renal hila likely parapelvic cyst is seen on prior CT. Normal renal parenchymal echogenicity. No shadowing calculus. No solid renal mass. Bladder: Appears normal for degree of bladder distention. Both ureteral jets are seen. Other: None. IMPRESSION: Cystic changes at the renal hila likely parapelvic cysts as seen on prior CT. Mild bilateral hydronephrosis is felt less likely as both ureteral jets are seen in the bladder. Electronically Signed   By: Keith Rake M.D.   On: 08/25/2020 01:55   DG Chest Port 1 View  Result Date: 08/25/2020 CLINICAL DATA:  Shortness of breath.  COVID-19 positive. EXAM: PORTABLE CHEST 1 VIEW COMPARISON:  August 23, 2020. FINDINGS: Stable cardiomediastinal silhouette. No pneumothorax is noted. Stable bilateral lung opacities are noted concerning for multifocal pneumonia. Bony thorax is unremarkable. IMPRESSION: Stable bilateral lung opacities are noted concerning for multifocal pneumonia. Electronically Signed   By: Marijo Conception M.D.   On: 08/25/2020 09:36   DG Chest Port 1 View  Result Date: 08/23/2020 CLINICAL DATA:  Shortness of breath EXAM: PORTABLE CHEST 1 VIEW COMPARISON:  None. FINDINGS:  There is ill-defined airspace opacity in portions of each mid and lower lung region. Heart size and pulmonary vascularity are normal. No adenopathy. There is degenerative change in the left shoulder. IMPRESSION: Ill-defined airspace opacity in the mid and lower lung regions, most indicative of multifocal pneumonia. Question atypical organism pneumonia given this appearance. Check of COVID-19 status in this regard advised. Heart size within normal limits.  No adenopathy appreciable. Electronically Signed   By: Lowella Grip III M.D.   On: 08/23/2020 11:33   VAS [place] LOWER EXTREMITY VENOUS (DVT)  Result Date: 08/24/2020  Lower Venous DVTStudy Indications: Elevated d-dimer, covid.  Comparison Study: No prior studies. Performing Technologist: Darlin Coco  Examination Guidelines: A complete evaluation includes B-mode imaging, spectral Doppler, color Doppler, and power Doppler as needed of all accessible portions of each vessel. Bilateral testing is considered an integral part of a complete examination. Limited examinations for reoccurring indications may be performed as noted. The reflux portion of the exam is performed with the patient in reverse Trendelenburg.  +---------+---------------+---------+-----------+----------+--------------+ RIGHT    CompressibilityPhasicitySpontaneityPropertiesThrombus Aging +---------+---------------+---------+-----------+----------+--------------+ CFV      Full           Yes      Yes                                 +---------+---------------+---------+-----------+----------+--------------+ SFJ      Full                                                        +---------+---------------+---------+-----------+----------+--------------+ FV Prox  Full                                                        +---------+---------------+---------+-----------+----------+--------------+ FV Mid   Full                                                        +---------+---------------+---------+-----------+----------+--------------+ FV DistalFull                                                        +---------+---------------+---------+-----------+----------+--------------+ PFV      Full                                                        +---------+---------------+---------+-----------+----------+--------------+ POP      Full           Yes      Yes                                  +---------+---------------+---------+-----------+----------+--------------+  PTV      Full                                                        +---------+---------------+---------+-----------+----------+--------------+ PERO     Full                                                        +---------+---------------+---------+-----------+----------+--------------+   +---------+---------------+---------+-----------+----------+--------------+ LEFT     CompressibilityPhasicitySpontaneityPropertiesThrombus Aging +---------+---------------+---------+-----------+----------+--------------+ CFV      Full           Yes      Yes                                 +---------+---------------+---------+-----------+----------+--------------+ SFJ      Full                                                        +---------+---------------+---------+-----------+----------+--------------+ FV Prox  Full                                                        +---------+---------------+---------+-----------+----------+--------------+ FV Mid   Full                                                        +---------+---------------+---------+-----------+----------+--------------+ FV DistalFull                                                        +---------+---------------+---------+-----------+----------+--------------+ PFV      Full                                                        +---------+---------------+---------+-----------+----------+--------------+ POP      Full           Yes      Yes                                 +---------+---------------+---------+-----------+----------+--------------+ PTV      Full                                                        +---------+---------------+---------+-----------+----------+--------------+  PERO     Full                                                         +---------+---------------+---------+-----------+----------+--------------+     Summary: RIGHT: - There is no evidence of deep vein thrombosis in the lower extremity.  - No cystic structure found in the popliteal fossa.  LEFT: - There is no evidence of deep vein thrombosis in the lower extremity.  - No cystic structure found in the popliteal fossa.  *See table(s) above for measurements and observations. Electronically signed by Deitra Mayo MD on 08/24/2020 at 5:36:23 PM.    Final

## 2020-08-27 NOTE — Plan of Care (Signed)

## 2020-08-27 NOTE — Progress Notes (Signed)
Occupational Therapy Treatment Patient Details Name: Candace [name] MRN: [id number] DOB: 09-10-1951 Today's Date: 08/27/2020    History of present illness 69 y.o. female with medical history significant of hypertension, hyperlipidemia, diabetes mellitus, and anal cancer with colostomy who comes to the hospital from urgent care due to hypotension as well as weakness. Tested +COVID 10/21 at Urgent care; unvaccinated.    OT comments  Pt continues to present with poor cognition, balance, strength, and activity tolerance. Pt requiring increased cues and time throughout for attention, problem solving, and awareness. Pt performing toileting with Min-Mod A for toilet transfer to regular toilet and then Min guard A and Min cues for toilet hygiene. Pt requiring Min Guard A and Min cues for hand hygiene. Pt repeating throughout session "I am so tired". SpO2 90% on 3L and HR 90s. Continue to recommend dc to SNF and will continue to follow acutely as admitted.    Follow Up Recommendations  SNF;Other (comment) (pending progress)    Equipment Recommendations  3 in 1 bedside commode;Wheelchair (measurements OT);Wheelchair cushion (measurements OT)    Recommendations for Other Services      Precautions / Restrictions Precautions Precautions: Fall Precaution Comments: baseline colostomy       Mobility Bed Mobility Overal bed mobility: Needs Assistance Bed Mobility: Sit to Supine       Sit to supine: Min guard   General bed mobility comments: Min Guard A for safety  Transfers Overall transfer level: Needs assistance Equipment used: Rolling walker (2 wheeled) Transfers: Sit to/from Stand Sit to Stand: Min assist;Mod assist         General transfer comment: Min A to power up from recliner. Cues for hand placement. Pt requiring Mod A to power up from low toilet.     Balance Overall balance assessment: Needs assistance Sitting-balance support: No upper extremity supported;Feet  supported Sitting balance-Leahy Scale: Fair     Standing balance support: No upper extremity supported;During functional activity Standing balance-Leahy Scale: Poor Standing balance comment: Reliant on UE support                           ADL either performed or assessed with clinical judgement   ADL Overall ADL's : Needs assistance/impaired     Grooming: Min guard;Wash/dry hands;Standing;Cueing for sequencing Grooming Details (indicate cue type and reason): Min Guard A for safety. Pt leaning on her elbows for support and fatigue. Pt requiring Min cues to locate and use soap.                  Toilet Transfer: Moderate assistance;Grab bars;RW;Regular Glass blower/designer Details (indicate cue type and reason): Mod A to power up into standing from low toilet Toileting- Clothing Manipulation and Hygiene: Min guard;Sitting/lateral lean Toileting - Clothing Manipulation Details (indicate cue type and reason): Pt sitting at toilet and requiring increased time for problem solving on how to perform peri care with IV in hand.      Functional mobility during ADLs: Minimal assistance;Rolling walker General ADL Comments: Pt continues to present with decreased cognition and activity tolerance. Throughout session, pt stating "I am so tired"     Vision       Perception     Praxis      Cognition Arousal/Alertness: Awake/alert Behavior During Therapy: Flat affect Overall Cognitive Status: Impaired/Different from baseline Area of Impairment: Attention;Following commands;Safety/judgement;Awareness;Problem solving;Orientation                 Orientation  Level: Time (not day +month) Current Attention Level: Sustained   Following Commands: Follows one step commands with increased time Safety/Judgement: Decreased awareness of safety;Decreased awareness of deficits Awareness: Intellectual Problem Solving: Slow processing;Decreased initiation;Requires verbal  cues General Comments: Pt continues to present with decreased attention, problems solving, and awareness. When asking pt her name and date of birth, pt repeating her naem and address three times. Pt drinking her coffee and clearly making a face because the coffee was warm/hot. Cueing pt to problem solve it is hot and will need to let it cool. Pt then attempting to take second sip desptie just stating it needed to cool.         Exercises     Shoulder Instructions       General Comments SpO2 90s on 3L O2. HR 90s.     Pertinent Vitals/ Pain       Pain Assessment: No/denies pain  Home Living                                          Prior Functioning/Environment              Frequency  Min 3X/week        Progress Toward Goals  OT Goals(current goals can now be found in the care plan section)  Progress towards OT goals: Progressing toward goals  Acute Rehab OT Goals Patient Stated Goal: get better and go home OT Goal Formulation: With patient Time For Goal Achievement: 09/08/20 Potential to Achieve Goals: Good ADL Goals Pt Will Transfer to Toilet: with supervision;ambulating;regular height toilet Pt Will Perform Tub/Shower Transfer: with supervision;ambulating;shower seat;rolling walker Pt/caregiver will Perform Home Exercise Program: Increased strength;Both right and left upper extremity;With written HEP provided;With theraband Additional ADL Goal #1: Pt will verbalize at least 3 energy conservation strategies to apply to ADL routine Additional ADL Goal #2: Pt will problem solve 3-5 step ADL task with min VC's from therapist for safe and successful completion  Plan      Co-evaluation                 AM-PAC OT "6 Clicks" Daily Activity     Outcome Measure   Help from another person eating meals?: A Little Help from another person taking care of personal grooming?: A Little Help from another person toileting, which includes using toliet,  bedpan, or urinal?: A Lot Help from another person bathing (including washing, rinsing, drying)?: A Lot Help from another person to put on and taking off regular upper body clothing?: A Little Help from another person to put on and taking off regular lower body clothing?: A Lot 6 Click Score: 15    End of Session Equipment Utilized During Treatment: Rolling walker;Oxygen;Gait belt  OT Visit Diagnosis: Unsteadiness on feet (R26.81);Other abnormalities of gait and mobility (R26.89);Muscle weakness (generalized) (M62.81);Other symptoms and signs involving cognitive function   Activity Tolerance Patient tolerated treatment well   Patient Left in bed;with call bell/phone within reach   Nurse Communication Mobility status        Time: 7494-4967 OT Time Calculation (min): 29 min  Charges: OT General Charges $OT Visit: 1 Visit OT Treatments $Self Care/Home Management : 8-22 mins  Bruno, OTR/L Acute Rehab Pager: [phone] Office: [place] 08/27/2020, 4:58 PM

## 2020-08-28 LAB — COMPREHENSIVE METABOLIC PANEL
ALT: 21 U/L (ref 0–44)
AST: 26 U/L (ref 15–41)
Albumin: 2.6 g/dL — ABNORMAL LOW (ref 3.5–5.0)
Alkaline Phosphatase: 55 U/L (ref 38–126)
Anion gap: 11 (ref 5–15)
BUN: 64 mg/dL — ABNORMAL HIGH (ref 8–23)
CO2: 23 mmol/L (ref 22–32)
Calcium: 10.3 mg/dL (ref 8.9–10.3)
Chloride: 109 mmol/L (ref 98–111)
Creatinine, Ser: 1.38 mg/dL — ABNORMAL HIGH (ref 0.44–1.00)
GFR, Estimated: 41 mL/min — ABNORMAL LOW (ref >60)
Glucose, Bld: 216 mg/dL — ABNORMAL HIGH (ref 70–99)
Potassium: 4.4 mmol/L (ref 3.5–5.1)
Sodium: 143 mmol/L (ref 135–145)
Total Bilirubin: 0.3 mg/dL (ref 0.3–1.2)
Total Protein: 6.4 g/dL — ABNORMAL LOW (ref 6.5–8.1)

## 2020-08-28 LAB — CBC WITH DIFFERENTIAL/PLATELET
Abs Immature Granulocytes: 0.22 10*3/uL — ABNORMAL HIGH (ref 0.00–0.07)
Basophils Absolute: 0 10*3/uL (ref 0.0–0.1)
Basophils Relative: 0 %
Eosinophils Absolute: 0 10*3/uL (ref 0.0–0.5)
Eosinophils Relative: 0 %
HCT: 41.2 % (ref 36.0–46.0)
Hemoglobin: 13 g/dL (ref 12.0–15.0)
Immature Granulocytes: 2 %
Lymphocytes Relative: 4 %
Lymphs Abs: 0.4 10*3/uL — ABNORMAL LOW (ref 0.7–4.0)
MCH: 22.7 pg — ABNORMAL LOW (ref 26.0–34.0)
MCHC: 31.6 g/dL (ref 30.0–36.0)
MCV: 72 fL — ABNORMAL LOW (ref 80.0–100.0)
Monocytes Absolute: 1.5 10*3/uL — ABNORMAL HIGH (ref 0.1–1.0)
Monocytes Relative: 14 %
Neutro Abs: 8.6 10*3/uL — ABNORMAL HIGH (ref 1.7–7.7)
Neutrophils Relative %: 80 %
Platelets: 308 10*3/uL (ref 150–400)
RBC: 5.72 MIL/uL — ABNORMAL HIGH (ref 3.87–5.11)
RDW: 15.3 % (ref 11.5–15.5)
WBC: 10.8 10*3/uL — ABNORMAL HIGH (ref 4.0–10.5)
nRBC: 0 % (ref 0.0–0.2)

## 2020-08-28 LAB — CULTURE, BLOOD (ROUTINE X 2)
Culture: NO GROWTH
Culture: NO GROWTH
Special Requests: ADEQUATE

## 2020-08-28 LAB — BLOOD GAS, ARTERIAL
Acid-Base Excess: 1.3 mmol/L (ref 0.0–2.0)
Bicarbonate: 24.6 mmol/L (ref 20.0–28.0)
Drawn by: 406621
FIO2: 21
O2 Saturation: 89 %
Patient temperature: 37
pCO2 arterial: 33.6 mmHg (ref 32.0–48.0)
pH, Arterial: 7.478 — ABNORMAL HIGH (ref 7.350–7.450)
pO2, Arterial: 56.8 mmHg — ABNORMAL LOW (ref 83.0–108.0)

## 2020-08-28 LAB — MAGNESIUM: Magnesium: 1.8 mg/dL (ref 1.7–2.4)

## 2020-08-28 LAB — PROCALCITONIN: Procalcitonin: 0.54 ng/mL

## 2020-08-28 LAB — GLUCOSE, CAPILLARY
Glucose-Capillary: 184 mg/dL — ABNORMAL HIGH (ref 70–99)
Glucose-Capillary: 232 mg/dL — ABNORMAL HIGH (ref 70–99)
Glucose-Capillary: 288 mg/dL — ABNORMAL HIGH (ref 70–99)
Glucose-Capillary: 281 mg/dL — ABNORMAL HIGH (ref 70–99)

## 2020-08-28 LAB — D-DIMER, QUANTITATIVE: D-Dimer, Quant: 1.87 ug/mL-FEU — ABNORMAL HIGH (ref 0.00–0.50)

## 2020-08-28 LAB — C-REACTIVE PROTEIN: CRP: 5.8 mg/dL — ABNORMAL HIGH (ref <1.0)

## 2020-08-28 LAB — BRAIN NATRIURETIC PEPTIDE: B Natriuretic Peptide: 34.7 pg/mL (ref 0.0–100.0)

## 2020-08-28 MED ORDER — FUROSEMIDE 40 MG PO TABS
40.0000 mg | ORAL_TABLET | Freq: Once | ORAL | Status: AC
  Administered 2020-08-28: 40 mg via ORAL
  Filled 2020-08-28: qty 1

## 2020-08-28 MED ORDER — METHYLPREDNISOLONE SODIUM SUCC 40 MG IJ SOLR
20.0000 mg | Freq: Every day | INTRAMUSCULAR | Status: DC
  Administered 2020-08-29: 20 mg via INTRAVENOUS
  Filled 2020-08-28: qty 1

## 2020-08-28 NOTE — Plan of Care (Signed)
  Problem: Coping: Goal: Psychosocial and spiritual needs will be supported Outcome: Progressing   Problem: Respiratory: Goal: Will maintain a patent airway Outcome: Progressing Goal: Complications related to the disease process, condition or treatment will be avoided or minimized Outcome: Progressing   Problem: Health Behavior/Discharge Planning: Goal: Ability to manage health-related needs will improve Outcome: Progressing   Problem: Clinical Measurements: Goal: Ability to maintain clinical measurements within normal limits will improve Outcome: Progressing Goal: Will remain free from infection Outcome: Progressing Goal: Diagnostic test results will improve Outcome: Progressing Goal: Respiratory complications will improve Outcome: Progressing Goal: Cardiovascular complication will be avoided Outcome: Progressing   Problem: Coping: Goal: Level of anxiety will decrease Outcome: Progressing   Problem: Elimination: Goal: Will not experience complications related to bowel motility Outcome: Progressing Goal: Will not experience complications related to urinary retention Outcome: Progressing   Problem: Pain Managment: Goal: General experience of comfort will improve Outcome: Progressing   Problem: Safety: Goal: Ability to remain free from injury will improve Outcome: Progressing   Problem: Skin Integrity: Goal: Risk for impaired skin integrity will decrease Outcome: Progressing   Problem: Education: Goal: Knowledge of risk factors and measures for prevention of condition will improve Outcome: Not Progressing   Problem: Education: Goal: Knowledge of General Education information will improve Description: Including pain rating scale, medication(s)/side effects and non-pharmacologic comfort measures Outcome: Not Progressing   Problem: Activity: Goal: Risk for activity intolerance will decrease Outcome: Not Progressing   Problem: Nutrition: Goal: Adequate  nutrition will be maintained Outcome: Not Progressing

## 2020-08-28 NOTE — Progress Notes (Signed)
PROGRESS NOTE                                                                                                                                                                                                             Patient Demographics:    Candace [name], is a 69 y.o. female, DOB - 1951/03/05, ZTI:[id number]  Outpatient Primary MD for the patient is Patient, No Pcp Per    LOS - 5  Admit date - 08/23/2020    CC -      Brief Narrative (HPI from H&P)   Candace [name] is a 69 y.o. female with medical history significant of hypertension, hyperlipidemia, diabetes mellitus, who comes to the hospital from urgent care due to hypotension as well as weakness.  Patient has been having generalized weakness, intermittent fever and chills for the past 7 to 8 days, she went to an urgent care on 10/21 when she tested positive for Covid, subsequently felt weak, eventually presented to the ER where she was found to have dehydration, AKI, hypoxic respiratory failure due to COVID-19 pneumonia and admitted to the hospital.   Subjective:   Patient in bed, appears comfortable, denies any headache, no fever, no chest pain or pressure, improved shortness of breath , no abdominal pain. No focal weakness.    Assessment  & Plan :     1. Acute Hypoxic Resp. Failure due to Acute Covid 19 Viral Pneumonitis during the ongoing 2020 Covid 19 Pandemic - she is unfortunately unvaccinated and seems to have incurred moderate parenchymal lung injury, being treated with IV steroids and Remdesivir and so far as shown good response, currently on 3 to 4 L oxygen.  Encouraged the patient to sit up in chair in the daytime use I-S and flutter valve for pulmonary toiletry and then prone in bed when at night.  Will advance activity and titrate down oxygen as possible.   SpO2: 95 % O2 Flow Rate (L/min): (S) 4 L/min  Recent Labs  Lab 08/23/20 1205 08/23/20 1220  08/23/20 1430 08/23/20 1436 08/24/20 0122 08/24/20 0834 08/25/20 0044 08/26/20 0145 08/27/20 0038 08/28/20 0241  WBC   < >  --   --   --  5.9  --  8.9 10.7* 11.3* 10.8*  HGB   < >  --   --   --  12.6  --  12.2 12.9 12.9 13.0  HCT   < >  --   --   --  39.8  --  38.1 40.0 40.3 41.2  PLT   < >  --   --   --  211  --  256 287 335 308  CRP  --   --   --   --  8.7*  --  6.8* 4.3* 3.2* 5.8*  BNP  --   --   --   --   --   --  33.2 60.2 44.8 34.7  DDIMER   < >  --   --   --  1.51*  --  1.82* 1.81* 2.03* 1.87*  PROCALCITON   < >  --   --   --   --  2.28 1.62 0.80 1.10 0.54  AST   < >  --   --   --  43*  --  26 22 23 26   ALT   < >  --   --   --  21  --  21 19 18 21   ALKPHOS   < >  --   --   --  57  --  53 55 53 55  BILITOT   < >  --   --   --  0.5  --  0.8 0.3 0.5 0.3  ALBUMIN   < >  --   --   --  2.9*  --  2.6* 2.7* 2.7* 2.6*  LATICACIDVEN  --  3.3* 1.1  --   --   --   --   --   --   --   SARSCOV2NAA  --   --   --  POSITIVE*  --   --   --   --   --   --    < > = values in this interval not displayed.    2.  Dehydration, hyponatremia with AKI.  Improved with hydration, continue to monitor, nonacute renal ultrasound with mild hydronephrosis likely due to ureteral jets, will require outpatient urology follow-up.  3.  Hypertension.  Pressure improved after IV fluids and hydration, start on Lopressor and monitor.  4.  Elevated D-dimer due to inflammation.  Prophylactic dose heparin, negative leg ultrasound.  5. Dyslipidemia.  On statin.  6.  UTI.  Rocephin x3 days.   7.  History of colon cancer.  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  Outpatient PCP follow-up.    8. DM type II.  On sliding scale.  Add Lantus for better control and monitor.  Lab Results  Component Value Date   HGBA1C 7.0 (H) 08/24/2020   CBG (last 3)  Recent Labs    08/27/20 1700 08/27/20 2041 08/28/20 0748  GLUCAP 279* 233* 184*       Condition -   Guarded  Family Communication  :   Called spouse  [phone] on 08/24/2020 at 12:05 PM.  Message left  updated daughter Candace [name] [phone]  08/27/20, 08/28/20 message left  Son updated via patient's cell phone on 08/25/2020.   Code Status :  Full  Consults  :  None  Procedures  :    Renal US - Cystic changes at the renal hila likely parapelvic cysts as seen on prior CT. Mild bilateral hydronephrosis is felt less likely as both ureteral jets are seen in the bladder  Leg [place] - No DVT  PUD Prophylaxis : None  Disposition Plan  :    Status  is: Inpatient  Remains inpatient appropriate because:IV treatments appropriate due to intensity of illness or inability to take PO  Dispo: The patient is from: Home              Anticipated d/c is to: Home              Anticipated d/c date is: > 3 days              Patient currently is not medically stable to d/c.  DVT Prophylaxis  : We will place on prophylactic Eliquis as cannot use Lovenox moderate dose due to changing renal insufficiency and heparin seems to be insufficient.  Lab Results  Component Value Date   PLT 308 08/28/2020    Diet :  Diet Order            Diet regular Room service appropriate? Yes; Fluid consistency: Thin  Diet effective now                  Inpatient Medications  Scheduled Meds: . apixaban  2.5 mg Oral BID  . aspirin EC  81 mg Oral Daily  . insulin aspart  0-9 Units Subcutaneous TID WC  . insulin glargine  20 Units Subcutaneous Daily  . linagliptin  5 mg Oral Daily  . methylPREDNISolone (SOLU-MEDROL) injection  40 mg Intravenous Daily  . metoprolol tartrate  50 mg Oral BID  . rosuvastatin  40 mg Oral Daily   Continuous Infusions:  PRN Meds:.acetaminophen, albuterol, chlorpheniramine-HYDROcodone, guaiFENesin-dextromethorphan, [DISCONTINUED] ondansetron **OR** ondansetron (ZOFRAN) IV  Antibiotics  :    Anti-infectives (From admission, onward)   Start     Dose/Rate Route Frequency Ordered Stop   08/25/20 1300  cefTRIAXone (ROCEPHIN) 1 g in  sodium chloride 0.9 % 100 mL IVPB        1 g 200 mL/hr over 30 Minutes Intravenous Every 24 hours 08/25/20 1208 08/27/20 1927   08/24/20 1000  remdesivir 100 mg in sodium chloride 0.9 % 100 mL IVPB  Status:  Discontinued       "Followed by" Linked Group Details   100 mg 200 mL/hr over 30 Minutes Intravenous Daily 08/23/20 1434 08/23/20 1435   08/24/20 1000  remdesivir 100 mg in sodium chloride 0.9 % 100 mL IVPB       "Followed by" Linked Group Details   100 mg 200 mL/hr over 30 Minutes Intravenous Daily 08/23/20 1426 08/27/20 1927   08/23/20 1434  remdesivir 200 mg in sodium chloride 0.9% 250 mL IVPB  Status:  Discontinued       "Followed by" Linked Group Details   200 mg 580 mL/hr over 30 Minutes Intravenous Once 08/23/20 1434 08/23/20 1435   08/23/20 1430  remdesivir 200 mg in sodium chloride 0.9% 250 mL IVPB       "Followed by" Linked Group Details   200 mg 580 mL/hr over 30 Minutes Intravenous Once 08/23/20 1426 08/23/20 1702       Time Spent in minutes  30   Lala Lund M.D on 08/28/2020 at 9:57 AM  To page go to [email or web address] - password [facility]  [facility] -  Office  [phone]   See all Orders from today for further details    Objective:   Vitals:   08/27/20 1948 08/27/20 2038 08/27/20 2112 08/28/20 0455  BP:  126/79  127/74  Pulse:  83  86  Resp:  20  19  Temp:  98 F (36.7 C)  98.5 F (36.9 C)  TempSrc:  Oral  Oral  SpO2: 99% 100% 91% 95%  Weight:      Height:        Wt Readings from Last 3 Encounters:  08/23/20 99.8 kg     Intake/Output Summary (Last 24 hours) at 08/28/2020 0957 Last data filed at 08/28/2020 0459 Gross per 24 hour  Intake 360 ml  Output 1650 ml  Net -1290 ml     Physical Exam  Awake Alert, No new F.N deficits, Normal affect .AT,PERRAL Supple Neck,No JVD, No cervical lymphadenopathy appriciated.  Symmetrical Chest wall movement, Good air movement bilaterally, CTAB RRR,No Gallops, Rubs or new Murmurs, No  Parasternal Heave +ve B.Sounds, Abd Soft, No tenderness, No organomegaly appriciated, colostomy bag in place, No Cyanosis, Clubbing or edema, No new Rash or bruise      Data Review:    CBC Recent Labs  Lab 08/24/20 0122 08/25/20 0044 08/26/20 0145 08/27/20 0038 08/28/20 0241  WBC 5.9 8.9 10.7* 11.3* 10.8*  HGB 12.6 12.2 12.9 12.9 13.0  HCT 39.8 38.1 40.0 40.3 41.2  PLT 211 256 287 335 308  MCV 72.5* 71.2* 71.6* 70.7* 72.0*  MCH 23.0* 22.8* 23.1* 22.6* 22.7*  MCHC 31.7 32.0 32.3 32.0 31.6  RDW 15.2 15.4 15.5 15.4 15.3  LYMPHSABS 0.3* 0.0* 0.3* 0.4* 0.4*  MONOABS 0.3 1.2* 1.1* 1.7* 1.5*  EOSABS 0.0 0.0 0.0 0.0 0.0  BASOSABS 0.0 0.0 0.0 0.0 0.0    Recent Labs  Lab 08/23/20 1205 08/23/20 1220 08/23/20 1430 08/24/20 0122 08/24/20 0834 08/24/20 1343 08/25/20 0044 08/26/20 0145 08/27/20 0038 08/28/20 0241  NA   < >  --   --  134*  --   --  140 140 146* 143  K   < >  --   --  5.4*  --   --  4.4 4.1 4.3 4.4  CL   < >  --   --  105  --   --  110 109 112* 109  CO2   < >  --   --  14*  --   --  18* 19* 23 23  GLUCOSE   < >  --   --  216*  --   --  230* 295* 154* 216*  BUN   < >  --   --  80*  --   --  97* 97* 81* 64*  CREATININE   < >  --   --  3.63*  --   --  2.56* 1.99* 1.53* 1.38*  CALCIUM   < >  --   --  9.1  --   --  9.4 10.1 10.7* 10.3  AST   < >  --   --  43*  --   --  26 22 23 26   ALT   < >  --   --  21  --   --  21 19 18 21   ALKPHOS   < >  --   --  57  --   --  53 55 53 55  BILITOT   < >  --   --  0.5  --   --  0.8 0.3 0.5 0.3  ALBUMIN   < >  --   --  2.9*  --   --  2.6* 2.7* 2.7* 2.6*  MG  --   --   --   --  2.3  --  2.4 2.1 2.0 1.8  CRP  --   --   --  8.7*  --   --  6.8* 4.3* 3.2* 5.8*  DDIMER   < >  --   --  1.51*  --   --  1.82* 1.81* 2.03* 1.87*  PROCALCITON   < >  --   --   --  2.28  --  1.62 0.80 1.10 0.54  LATICACIDVEN  --  3.3* 1.1  --   --   --   --   --   --   --   HGBA1C  --   --   --   --   --  7.0*  --   --   --   --   BNP  --   --   --   --    --   --  33.2 60.2 44.8 34.7   < > = values in this interval not displayed.    ------------------------------------------------------------------------------------------------------------------ No results for input(s): CHOL, HDL, LDLCALC, TRIG, CHOLHDL, LDLDIRECT in the last 72 hours.  Lab Results  Component Value Date   HGBA1C 7.0 (H) 08/24/2020   ------------------------------------------------------------------------------------------------------------------ No results for input(s): TSH, T4TOTAL, T3FREE, THYROIDAB in the last 72 hours.  Invalid input(s): FREET3  Cardiac Enzymes No results for input(s): CKMB, TROPONINI, MYOGLOBIN in the last 168 hours.  Invalid input(s): CK ------------------------------------------------------------------------------------------------------------------    Component Value Date/Time   BNP 34.7 08/28/2020 0241    Micro Results Recent Results (from the past 240 hour(s))  Blood Culture (routine x 2)     Status: None (Preliminary result)   Collection Time: 08/23/20 11:23 AM   Specimen: BLOOD RIGHT ARM  Result Value Ref Range Status   Specimen Description BLOOD RIGHT ARM  Final   Special Requests   Final    BOTTLES DRAWN AEROBIC ONLY Blood Culture adequate volume   Culture   Final    NO GROWTH 4 DAYS Performed at [facility], [address], [place], [place] [address]    Report Status PENDING  Incomplete  Blood Culture (routine x 2)     Status: None (Preliminary result)   Collection Time: 08/23/20 11:53 AM   Specimen: BLOOD RIGHT HAND  Result Value Ref Range Status   Specimen Description BLOOD RIGHT HAND  Final   Special Requests   Final    BOTTLES DRAWN AEROBIC AND ANAEROBIC Blood Culture results may not be optimal due to an inadequate volume of blood received in culture bottles   Culture   Final    NO GROWTH 4 DAYS Performed at [facility], [place] [address] [place], [place] [address]    Report Status PENDING  Incomplete   Resp Panel by RT PCR (RSV, Flu A&B, Covid) - Nasopharyngeal Swab     Status: Abnormal   Collection Time: 08/23/20  2:36 PM   Specimen: Nasopharyngeal Swab  Result Value Ref Range Status   SARS Coronavirus 2 by RT PCR POSITIVE (A) NEGATIVE Final    Comment: RESULT CALLED TO, READ BACK BY AND VERIFIED WITH: A COLEMAN RN 08/23/20 AT 1740 SK (NOTE) SARS-CoV-2 target nucleic acids are DETECTED.  SARS-CoV-2 RNA is generally detectable in upper respiratory specimens  during the acute phase of infection. Positive results are indicative of the presence of the identified virus, but do not rule out bacterial infection or co-infection with other pathogens not detected by the test. Clinical correlation with patient history and other diagnostic information is necessary to determine patient infection status. The expected result is Negative.  Fact Sheet for Patients:  PinkCheek.be  Fact Sheet for Healthcare Providers: GravelBags.it  This test is not yet approved or cleared by the [place] FDA and  has been authorized for detection and/or diagnosis of SARS-CoV-2 by FDA under an Emergency Use Authorization (EUA).  This EUA will remain in effect (meaning this test can be [place] ed) for the duration of  the COVID-19 declaration under Section 564(b)(1) of the Act, 21 U.S.C. section 360bbb-3(b)(1), unless the authorization is terminated or revoked sooner.      Influenza A by PCR NEGATIVE NEGATIVE Final   Influenza B by PCR NEGATIVE NEGATIVE Final    Comment: (NOTE) The Xpert Xpress SARS-CoV-2/FLU/RSV assay is intended as an aid in  the diagnosis of influenza from Nasopharyngeal swab specimens and  should not be used as a sole basis for treatment. Nasal washings and  aspirates are unacceptable for Xpert Xpress SARS-CoV-2/FLU/RSV  testing.  Fact Sheet for Patients: PinkCheek.be  Fact Sheet for Healthcare  Providers: GravelBags.it  This test is not yet approved or cleared by the [place] FDA and  has been authorized for detection and/or diagnosis of SARS-CoV-2 by  FDA under an Emergency Use Authorization (EUA). This EUA will remain  in effect (meaning this test can be used) for the duration of the  Covid-19 declaration under Section 564(b)(1) of the Act, 21  U.S.C. section 360bbb-3(b)(1), unless the authorization is  terminated or revoked.    Respiratory Syncytial Virus by PCR NEGATIVE NEGATIVE Final    Comment: (NOTE) Fact Sheet for Patients: PinkCheek.be  Fact Sheet for Healthcare Providers: GravelBags.it  This test is not yet approved or cleared by the [place] FDA and  has been authorized for detection and/or diagnosis of SARS-CoV-2 by  FDA under an Emergency Use Authorization (EUA). This EUA will remain  in effect (meaning this test can be used) for the duration of the  COVID-19 declaration under Section 564(b)(1) of the Act, 21 U.S.C.  section 360bbb-3(b)(1), unless the authorization is terminated or  revoked. Performed at [facility], [place] [address], [place], [place] [address]   MRSA PCR Screening     Status: None   Collection Time: 08/24/20  8:50 AM   Specimen: Nasal Mucosa; Nasopharyngeal  Result Value Ref Range Status   MRSA by PCR NEGATIVE NEGATIVE Final    Comment:        The GeneXpert MRSA Assay (FDA approved for NASAL specimens only), is one component of a comprehensive MRSA colonization surveillance program. It is not intended to diagnose MRSA infection nor to guide or monitor treatment for MRSA infections. Performed at [facility], [place] [address], [place], [place] [address]     Radiology Reports US RENAL  Result Date: 08/25/2020 CLINICAL DATA:  Acute kidney injury. EXAM: RENAL / URINARY TRACT ULTRASOUND COMPLETE COMPARISON:  Abdominal CT  06/07/2014 FINDINGS: Right Kidney: Renal measurements: 10.7 x 5.2 x 5.0 cm = volume: 146 mL. Cystic changes at the renal hila likely parapelvic cysts as seen on prior CT. Normal renal parenchymal echogenicity. No shadowing calculus. No solid renal mass. Left Kidney: Renal measurements: 10.7 x 6.2 x 5.7 cm = volume: 197 mL. Cystic changes at the renal hila likely parapelvic cyst is seen on prior CT. Normal renal parenchymal echogenicity. No shadowing calculus. No solid renal mass. Bladder: Appears normal for degree of bladder distention. Both ureteral jets are seen. Other: None. IMPRESSION: Cystic changes at the renal hila likely parapelvic cysts as seen on prior CT. Mild bilateral hydronephrosis is felt  less likely as both ureteral jets are seen in the bladder. Electronically Signed   By: Keith Rake M.D.   On: 08/25/2020 01:55   DG Chest Port 1 View  Result Date: 08/27/2020 CLINICAL DATA:  Shortness of breath EXAM: PORTABLE CHEST 1 VIEW COMPARISON:  08/25/2020 FINDINGS: No significant interval change in AP portable examination with heterogeneous bibasilar airspace opacity and possible small layering pleural effusions. No new airspace opacity. Cardiomegaly. IMPRESSION: No significant interval change in AP portable examination with heterogeneous bibasilar airspace opacity and possible small layering pleural effusions, consistent with multifocal infection. No new airspace opacity. Electronically Signed   By: Eddie Candle M.D.   On: 08/27/2020 11:04   DG Chest Port 1 View  Result Date: 08/25/2020 CLINICAL DATA:  Shortness of breath.  COVID-19 positive. EXAM: PORTABLE CHEST 1 VIEW COMPARISON:  August 23, 2020. FINDINGS: Stable cardiomediastinal silhouette. No pneumothorax is noted. Stable bilateral lung opacities are noted concerning for multifocal pneumonia. Bony thorax is unremarkable. IMPRESSION: Stable bilateral lung opacities are noted concerning for multifocal pneumonia. Electronically Signed   By:  Marijo Conception M.D.   On: 08/25/2020 09:36   DG Chest Port 1 View  Result Date: 08/23/2020 CLINICAL DATA:  Shortness of breath EXAM: PORTABLE CHEST 1 VIEW COMPARISON:  None. FINDINGS: There is ill-defined airspace opacity in portions of each mid and lower lung region. Heart size and pulmonary vascularity are normal. No adenopathy. There is degenerative change in the left shoulder. IMPRESSION: Ill-defined airspace opacity in the mid and lower lung regions, most indicative of multifocal pneumonia. Question atypical organism pneumonia given this appearance. Check of COVID-19 status in this regard advised. Heart size within normal limits.  No adenopathy appreciable. Electronically Signed   By: Lowella Grip III M.D.   On: 08/23/2020 11:33   VAS [place] LOWER EXTREMITY VENOUS (DVT)  Result Date: 08/24/2020  Lower Venous DVTStudy Indications: Elevated d-dimer, covid.  Comparison Study: No prior studies. Performing Technologist: Darlin Coco  Examination Guidelines: A complete evaluation includes B-mode imaging, spectral Doppler, color Doppler, and power Doppler as needed of all accessible portions of each vessel. Bilateral testing is considered an integral part of a complete examination. Limited examinations for reoccurring indications may be performed as noted. The reflux portion of the exam is performed with the patient in reverse Trendelenburg.  +---------+---------------+---------+-----------+----------+--------------+ RIGHT    CompressibilityPhasicitySpontaneityPropertiesThrombus Aging +---------+---------------+---------+-----------+----------+--------------+ CFV      Full           Yes      Yes                                 +---------+---------------+---------+-----------+----------+--------------+ SFJ      Full                                                        +---------+---------------+---------+-----------+----------+--------------+ FV Prox  Full                                                         +---------+---------------+---------+-----------+----------+--------------+ FV Mid   Full                                                        +---------+---------------+---------+-----------+----------+--------------+  FV DistalFull                                                        +---------+---------------+---------+-----------+----------+--------------+ PFV      Full                                                        +---------+---------------+---------+-----------+----------+--------------+ POP      Full           Yes      Yes                                 +---------+---------------+---------+-----------+----------+--------------+ PTV      Full                                                        +---------+---------------+---------+-----------+----------+--------------+ PERO     Full                                                        +---------+---------------+---------+-----------+----------+--------------+   +---------+---------------+---------+-----------+----------+--------------+ LEFT     CompressibilityPhasicitySpontaneityPropertiesThrombus Aging +---------+---------------+---------+-----------+----------+--------------+ CFV      Full           Yes      Yes                                 +---------+---------------+---------+-----------+----------+--------------+ SFJ      Full                                                        +---------+---------------+---------+-----------+----------+--------------+ FV Prox  Full                                                        +---------+---------------+---------+-----------+----------+--------------+ FV Mid   Full                                                        +---------+---------------+---------+-----------+----------+--------------+ FV DistalFull                                                         +---------+---------------+---------+-----------+----------+--------------+  PFV      Full                                                        +---------+---------------+---------+-----------+----------+--------------+ POP      Full           Yes      Yes                                 +---------+---------------+---------+-----------+----------+--------------+ PTV      Full                                                        +---------+---------------+---------+-----------+----------+--------------+ PERO     Full                                                        +---------+---------------+---------+-----------+----------+--------------+     Summary: RIGHT: - There is no evidence of deep vein thrombosis in the lower extremity.  - No cystic structure found in the popliteal fossa.  LEFT: - There is no evidence of deep vein thrombosis in the lower extremity.  - No cystic structure found in the popliteal fossa.  *See table(s) above for measurements and observations. Electronically signed by Deitra Mayo MD on 08/24/2020 at 5:36:23 PM.    Final

## 2020-08-29 DIAGNOSIS — R0602 Shortness of breath: Secondary | ICD-10-CM

## 2020-08-29 LAB — CBC WITH DIFFERENTIAL/PLATELET
Abs Immature Granulocytes: 0.25 10*3/uL — ABNORMAL HIGH (ref 0.00–0.07)
Basophils Absolute: 0 10*3/uL (ref 0.0–0.1)
Basophils Relative: 0 %
Eosinophils Absolute: 0 10*3/uL (ref 0.0–0.5)
Eosinophils Relative: 0 %
HCT: 43.6 % (ref 36.0–46.0)
Hemoglobin: 13.6 g/dL (ref 12.0–15.0)
Immature Granulocytes: 2 %
Lymphocytes Relative: 3 %
Lymphs Abs: 0.4 10*3/uL — ABNORMAL LOW (ref 0.7–4.0)
MCH: 23 pg — ABNORMAL LOW (ref 26.0–34.0)
MCHC: 31.2 g/dL (ref 30.0–36.0)
MCV: 73.6 fL — ABNORMAL LOW (ref 80.0–100.0)
Monocytes Absolute: 1.4 10*3/uL — ABNORMAL HIGH (ref 0.1–1.0)
Monocytes Relative: 11 %
Neutro Abs: 10.7 10*3/uL — ABNORMAL HIGH (ref 1.7–7.7)
Neutrophils Relative %: 84 %
Platelets: 366 10*3/uL (ref 150–400)
RBC: 5.92 MIL/uL — ABNORMAL HIGH (ref 3.87–5.11)
RDW: 15.4 % (ref 11.5–15.5)
WBC: 12.8 10*3/uL — ABNORMAL HIGH (ref 4.0–10.5)
nRBC: 0 % (ref 0.0–0.2)

## 2020-08-29 LAB — C-REACTIVE PROTEIN: CRP: 3.8 mg/dL — ABNORMAL HIGH (ref <1.0)

## 2020-08-29 LAB — COMPREHENSIVE METABOLIC PANEL
ALT: 25 U/L (ref 0–44)
AST: 29 U/L (ref 15–41)
Albumin: 2.7 g/dL — ABNORMAL LOW (ref 3.5–5.0)
Alkaline Phosphatase: 59 U/L (ref 38–126)
Anion gap: 10 (ref 5–15)
BUN: 56 mg/dL — ABNORMAL HIGH (ref 8–23)
CO2: 25 mmol/L (ref 22–32)
Calcium: 10.4 mg/dL — ABNORMAL HIGH (ref 8.9–10.3)
Chloride: 108 mmol/L (ref 98–111)
Creatinine, Ser: 1.45 mg/dL — ABNORMAL HIGH (ref 0.44–1.00)
GFR, Estimated: 39 mL/min — ABNORMAL LOW (ref >60)
Glucose, Bld: 269 mg/dL — ABNORMAL HIGH (ref 70–99)
Potassium: 4.7 mmol/L (ref 3.5–5.1)
Sodium: 143 mmol/L (ref 135–145)
Total Bilirubin: 0.6 mg/dL (ref 0.3–1.2)
Total Protein: 6.9 g/dL (ref 6.5–8.1)

## 2020-08-29 LAB — GLUCOSE, CAPILLARY
Glucose-Capillary: 217 mg/dL — ABNORMAL HIGH (ref 70–99)
Glucose-Capillary: 249 mg/dL — ABNORMAL HIGH (ref 70–99)

## 2020-08-29 LAB — MAGNESIUM: Magnesium: 1.7 mg/dL (ref 1.7–2.4)

## 2020-08-29 LAB — BRAIN NATRIURETIC PEPTIDE: B Natriuretic Peptide: 51 pg/mL (ref 0.0–100.0)

## 2020-08-29 LAB — PROCALCITONIN: Procalcitonin: 0.36 ng/mL

## 2020-08-29 LAB — D-DIMER, QUANTITATIVE: D-Dimer, Quant: 2.04 ug/mL-FEU — ABNORMAL HIGH (ref 0.00–0.50)

## 2020-08-29 MED ORDER — LEVOFLOXACIN 250 MG PO TABS
250.0000 mg | ORAL_TABLET | Freq: Every day | ORAL | 0 refills | Status: DC
Start: 2020-08-29 — End: 2020-12-23

## 2020-08-29 MED ORDER — APIXABAN 2.5 MG PO TABS: 2.5000 mg | ORAL_TABLET | Freq: Two times a day (BID) | ORAL | 0 refills | Status: AC

## 2020-08-29 MED ORDER — LEVOFLOXACIN 250 MG PO TABS
250.0000 mg | ORAL_TABLET | Freq: Every day | ORAL | Status: DC
  Administered 2020-08-29: 250 mg via ORAL
  Filled 2020-08-29: qty 1

## 2020-08-29 MED ORDER — AMLODIPINE BESYLATE 10 MG PO TABS: 10.0000 mg | ORAL_TABLET | Freq: Every day | ORAL | 11 refills | Status: DC

## 2020-08-29 MED ORDER — LEVOFLOXACIN 500 MG PO TABS
500.0000 mg | ORAL_TABLET | Freq: Every day | ORAL | 0 refills | Status: DC
Start: 2020-08-29 — End: 2020-08-29

## 2020-08-29 MED ORDER — ALBUTEROL SULFATE HFA 108 (90 BASE) MCG/ACT IN AERS: 2.0000 | INHALATION_SPRAY | Freq: Four times a day (QID) | RESPIRATORY_TRACT | 0 refills | Status: DC | PRN

## 2020-08-29 NOTE — Discharge Summary (Addendum)
Candace [name] GTX:[id number] DOB: [birth year] DOA: 08/23/2020  PCP: Patient, No Pcp Per  Admit date: 08/23/2020  Discharge date: 08/29/2020  Admitted From: Home   Disposition:  Home - qualified for SNF, discussed with patient and family both want her to come home they have enough help.   Recommendations for Outpatient Follow-up:   Follow up with PCP in 1-2 weeks  PCP Please obtain BMP/CBC, 2 view CXR in 1week,  (see Discharge instructions)   PCP Please follow up on the following pending results: Check CBC, CMP, D-dimer, 2 view chest x-ray in 7 to 10 days.  Arrange for outpatient urology follow-up.   Home Health: PT,RN   Equipment/Devices: 3lit o2  Consultations: None  Discharge Condition: Stable    CODE STATUS: Full    Diet Recommendation: Heart Healthy Low Carb  CC - SOB   Brief history of present illness from the day of admission and additional interim summary    Candace Clarkis a 69 y.o.femalewith medical history significant ofhypertension, hyperlipidemia, diabetes mellitus, who comes to the hospital from urgent care due to hypotension as well as weakness. Patient has been having generalized weakness, intermittent fever and chills for the past 7 to 8 days, she went to an urgent care on 10/21 when she tested positive for Covid, subsequently felt weak, eventually presented to the ER where she was found to have dehydration, AKI, hypoxic respiratory failure due to COVID-19 pneumonia and admitted to the hospital.                                                                 Hospital Course   1. Acute Hypoxic Resp. Failure due to Acute Covid 19 Viral Pneumonitis during the ongoing 2020 Covid 19 Pandemic - she is unfortunately unvaccinated and seems to have incurred moderate parenchymal lung injury, being treated  with IV steroids and Remdesivir and so far as shown good response, [place] at rest requiring 1 to 2 L of nasal cannula oxygen and symptom-free, upon ambulation requires between 2 to 3 L of oxygen, will get that on discharge.  Follow with PCP.  Request PCP to check CBC, BMP, D-dimer, 2 view chest x-ray next visit.   SpO2: 94 % O2 Flow Rate (L/min): (S) 1 L/min  Recent Labs  Lab 08/23/20 1220 08/23/20 1430 08/23/20 1436 08/24/20 0122 08/25/20 0044 08/26/20 0145 08/27/20 0038 08/28/20 0241 08/29/20 0323  WBC  --   --   --    < > 8.9 10.7* 11.3* 10.8* 12.8*  CRP  --   --   --    < > 6.8* 4.3* 3.2* 5.8* 3.8*  DDIMER  --   --   --    < > 1.82* 1.81* 2.03* 1.87* 2.04*  BNP  --   --   --   --  33.2 60.2 44.8 34.7 51.0  PROCALCITON  --   --   --    < > 1.62 0.80 1.10 0.54 0.36  LATICACIDVEN 3.3* 1.1  --   --   --   --   --   --   --   AST  --   --   --    < > 26 22 23 26 29   ALT  --   --   --    < > 21 19 18 21 25   ALKPHOS  --   --   --    < > 53 55 53 55 59  BILITOT  --   --   --    < > 0.8 0.3 0.5 0.3 0.6  ALBUMIN  --   --   --    < > 2.6* 2.7* 2.7* 2.6* 2.7*  SARSCOV2NAA  --   --  POSITIVE*  --   --   --   --   --   --    < > = values in this interval not displayed.      2.  Dehydration, hyponatremia with AKI.  Improved with hydration, continue to monitor, nonacute renal ultrasound with mild hydronephrosis likely due to ureteral jets, will require outpatient urology follow-up, defer to PCP.  3.  Hypertension.  Pressure improved after IV fluids and hydration, start on Lopressor and monitor.  4.  Elevated D-dimer due to inflammation.  Prophylactic dose heparin, negative leg ultrasound.  Will place on Eliquis prophylactic dose for 2 more weeks.  5. Dyslipidemia.  On statin.  6.    UTI.  Treated empirically with Rocephin, does have mildly elevated procalcitonin with a slightly productive cough which have developed early bronchitis, 4 days of oral Levaquin starting today.  7.   History of colon cancer.  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  Outpatient PCP follow-up.    8. DM type II.    Home regimen and follow with PCP.  Lab Results  Component Value Date   HGBA1C 7.0 (H) 08/24/2020     Discharge diagnosis     Active Problems:   AKI (acute kidney injury) [facility])    Discharge instructions    Discharge Instructions    Diet - low sodium heart healthy   Complete by: As directed    Discharge instructions   Complete by: As directed    Get copy of discharge summary to your PCP office.  You have to follow-up with a urologist within 1 to 2 weeks also to be arranged by your PCP.   Follow with Primary MD in 7 days   Get CBC, CMP, 2 view Chest X ray -  checked next visit within 1 week by Primary MD   Activity: As tolerated with Full fall precautions use walker/cane & assistance as needed  Disposition Home   Diet: Heart Healthy Low Carb   Special Instructions: If you have smoked or chewed Tobacco  in the last 2 yrs please stop smoking, stop any regular Alcohol  and or any Recreational drug use.  On your next visit with your primary care physician please Get Medicines reviewed and adjusted.  Please request your Prim.MD to go over all Hospital Tests and Procedure/Radiological results at the follow up, please get all Hospital records sent to your Prim MD by signing hospital release before you go home.  If you experience worsening of your admission symptoms, develop shortness of breath, life threatening emergency, suicidal or homicidal  thoughts you must seek medical attention immediately by calling 911 or calling your MD immediately  if symptoms less severe.  You Must read complete instructions/literature along with all the possible adverse reactions/side effects for all the Medicines you take and that have been prescribed to you. Take any new Medicines after you have completely understood and accpet all the possible adverse reactions/side effects.    Increase activity slowly   Complete by: As directed       Discharge Medications   Allergies as of 08/29/2020   No Known Allergies     Medication List    STOP taking these medications   lisinopril-hydrochlorothiazide 20-25 MG tablet Commonly known as: ZESTORETIC   methylPREDNISolone 4 MG Tbpk tablet Commonly known as: MEDROL DOSEPAK     TAKE these medications   acetaminophen 500 MG tablet Commonly known as: TYLENOL Take 1,000 mg by mouth every 6 (six) hours as needed for mild pain.   albuterol 108 (90 Base) MCG/ACT inhaler Commonly known as: VENTOLIN HFA Inhale 2 puffs into the lungs every 6 (six) hours as needed for wheezing or shortness of breath.   amLODipine 10 MG tablet Commonly known as: NORVASC Take 1 tablet (10 mg total) by mouth daily.   apixaban 2.5 MG Tabs tablet Commonly known as: ELIQUIS Take 1 tablet (2.5 mg total) by mouth 2 (two) times daily.   aspirin 81 MG EC tablet Take 81 mg by mouth daily.   levofloxacin 250 MG tablet Commonly known as: LEVAQUIN Take 1 tablet (250 mg total) by mouth daily.   metFORMIN 500 MG 24 hr tablet Commonly known as: GLUCOPHAGE-XR Take 500 mg by mouth daily.   rosuvastatin 40 MG tablet Commonly known as: CRESTOR Take 40 mg by mouth daily.   zinc gluconate 50 MG tablet Take 50 mg by mouth daily.            Durable Medical Equipment  (From admission, onward)         Start     Ordered   08/28/20 0742  For home use only DME Walker rolling  Once       Comments: 5 wheel  Question Answer Comment  Walker: With [place] Wheels   Patient needs a walker to treat with the following condition Weakness      08/28/20 0742   08/28/20 0741  For home use only DME oxygen  Once       Question Answer Comment  Length of Need 6 Months   Mode or (Route) Nasal cannula   Liters per Minute 3   Frequency Continuous (stationary and portable oxygen unit needed)   Oxygen conserving device Yes   Oxygen delivery system Gas       08/28/20 0740           Follow-up Information    [facility] [place]. Schedule an appointment as soon as possible for a visit in 1 week(s).   Contact information: [address] [place] [address] [phone]              Major procedures and Radiology Reports - PLEASE review detailed and final reports thoroughly  -       US RENAL  Result Date: 08/25/2020 CLINICAL DATA:  Acute kidney injury. EXAM: RENAL / URINARY TRACT ULTRASOUND COMPLETE COMPARISON:  Abdominal CT 06/07/2014 FINDINGS: Right Kidney: Renal measurements: 10.7 x 5.2 x 5.0 cm = volume: 146 mL. Cystic changes at the renal hila likely parapelvic cysts as seen  on prior CT. Normal renal parenchymal echogenicity. No shadowing calculus. No solid renal mass. Left Kidney: Renal measurements: 10.7 x 6.2 x 5.7 cm = volume: 197 mL. Cystic changes at the renal hila likely parapelvic cyst is seen on prior CT. Normal renal parenchymal echogenicity. No shadowing calculus. No solid renal mass. Bladder: Appears normal for degree of bladder distention. Both ureteral jets are seen. Other: None. IMPRESSION: Cystic changes at the renal hila likely parapelvic cysts as seen on prior CT. Mild bilateral hydronephrosis is felt less likely as both ureteral jets are seen in the bladder. Electronically Signed   By: Keith Rake M.D.   On: 08/25/2020 01:55   DG Chest Port 1 View  Result Date: 08/27/2020 CLINICAL DATA:  Shortness of breath EXAM: PORTABLE CHEST 1 VIEW COMPARISON:  08/25/2020 FINDINGS: No significant interval change in AP portable examination with heterogeneous bibasilar airspace opacity and possible small layering pleural effusions. No new airspace opacity. Cardiomegaly. IMPRESSION: No significant interval change in AP portable examination with heterogeneous bibasilar airspace opacity and possible small layering pleural effusions, consistent with multifocal infection. No new airspace  opacity. Electronically Signed   By: Eddie Candle M.D.   On: 08/27/2020 11:04   DG Chest Port 1 View  Result Date: 08/25/2020 CLINICAL DATA:  Shortness of breath.  COVID-19 positive. EXAM: PORTABLE CHEST 1 VIEW COMPARISON:  August 23, 2020. FINDINGS: Stable cardiomediastinal silhouette. No pneumothorax is noted. Stable bilateral lung opacities are noted concerning for multifocal pneumonia. Bony thorax is unremarkable. IMPRESSION: Stable bilateral lung opacities are noted concerning for multifocal pneumonia. Electronically Signed   By: Marijo Conception M.D.   On: 08/25/2020 09:36   DG Chest Port 1 View  Result Date: 08/23/2020 CLINICAL DATA:  Shortness of breath EXAM: PORTABLE CHEST 1 VIEW COMPARISON:  None. FINDINGS: There is ill-defined airspace opacity in portions of each mid and lower lung region. Heart size and pulmonary vascularity are normal. No adenopathy. There is degenerative change in the left shoulder. IMPRESSION: Ill-defined airspace opacity in the mid and lower lung regions, most indicative of multifocal pneumonia. Question atypical organism pneumonia given this appearance. Check of COVID-19 status in this regard advised. Heart size within normal limits.  No adenopathy appreciable. Electronically Signed   By: Lowella Grip III M.D.   On: 08/23/2020 11:33   VAS [place] LOWER EXTREMITY VENOUS (DVT)  Result Date: 08/24/2020  Lower Venous DVTStudy Indications: Elevated d-dimer, covid.  Comparison Study: No prior studies. Performing Technologist: Darlin Coco  Examination Guidelines: A complete evaluation includes B-mode imaging, spectral Doppler, color Doppler, and power Doppler as needed of all accessible portions of each vessel. Bilateral testing is considered an integral part of a complete examination. Limited examinations for reoccurring indications may be performed as noted. The reflux portion of the exam is performed with the patient in reverse Trendelenburg.   +---------+---------------+---------+-----------+----------+--------------+ RIGHT    CompressibilityPhasicitySpontaneityPropertiesThrombus Aging +---------+---------------+---------+-----------+----------+--------------+ CFV      Full           Yes      Yes                                 +---------+---------------+---------+-----------+----------+--------------+ SFJ      Full                                                        +---------+---------------+---------+-----------+----------+--------------+  FV Prox  Full                                                        +---------+---------------+---------+-----------+----------+--------------+ FV Mid   Full                                                        +---------+---------------+---------+-----------+----------+--------------+ FV DistalFull                                                        +---------+---------------+---------+-----------+----------+--------------+ PFV      Full                                                        +---------+---------------+---------+-----------+----------+--------------+ POP      Full           Yes      Yes                                 +---------+---------------+---------+-----------+----------+--------------+ PTV      Full                                                        +---------+---------------+---------+-----------+----------+--------------+ PERO     Full                                                        +---------+---------------+---------+-----------+----------+--------------+   +---------+---------------+---------+-----------+----------+--------------+ LEFT     CompressibilityPhasicitySpontaneityPropertiesThrombus Aging +---------+---------------+---------+-----------+----------+--------------+ CFV      Full           Yes      Yes                                  +---------+---------------+---------+-----------+----------+--------------+ SFJ      Full                                                        +---------+---------------+---------+-----------+----------+--------------+ FV Prox  Full                                                        +---------+---------------+---------+-----------+----------+--------------+  FV Mid   Full                                                        +---------+---------------+---------+-----------+----------+--------------+ FV DistalFull                                                        +---------+---------------+---------+-----------+----------+--------------+ PFV      Full                                                        +---------+---------------+---------+-----------+----------+--------------+ POP      Full           Yes      Yes                                 +---------+---------------+---------+-----------+----------+--------------+ PTV      Full                                                        +---------+---------------+---------+-----------+----------+--------------+ PERO     Full                                                        +---------+---------------+---------+-----------+----------+--------------+     Summary: RIGHT: - There is no evidence of deep vein thrombosis in the lower extremity.  - No cystic structure found in the popliteal fossa.  LEFT: - There is no evidence of deep vein thrombosis in the lower extremity.  - No cystic structure found in the popliteal fossa.  *See table(s) above for measurements and observations. Electronically signed by Deitra Mayo MD on 08/24/2020 at 5:36:23 PM.    Final     Micro Results     Recent Results (from the past 240 hour(s))  Blood Culture (routine x 2)     Status: None   Collection Time: 08/23/20 11:23 AM   Specimen: BLOOD RIGHT ARM  Result Value Ref Range Status   Specimen Description  BLOOD RIGHT ARM  Final   Special Requests   Final    BOTTLES DRAWN AEROBIC ONLY Blood Culture adequate volume   Culture   Final    NO GROWTH 5 DAYS Performed at [facility], [address] [place], [place] [address]    Report Status 08/28/2020 FINAL  Final  Blood Culture (routine x 2)     Status: None   Collection Time: 08/23/20 11:53 AM   Specimen: BLOOD RIGHT HAND  Result Value Ref Range Status   Specimen Description BLOOD RIGHT HAND  Final   Special Requests   Final  BOTTLES DRAWN AEROBIC AND ANAEROBIC Blood Culture results may not be optimal due to an inadequate volume of blood received in culture bottles   Culture   Final    NO GROWTH 5 DAYS Performed at [facility], [place] [address] [place], [place] [address]    Report Status 08/28/2020 FINAL  Final  Resp Panel by RT PCR (RSV, Flu A&B, Covid) - Nasopharyngeal Swab     Status: Abnormal   Collection Time: 08/23/20  2:36 PM   Specimen: Nasopharyngeal Swab  Result Value Ref Range Status   SARS Coronavirus 2 by RT PCR POSITIVE (A) NEGATIVE Final    Comment: RESULT CALLED TO, READ BACK BY AND VERIFIED WITH: A COLEMAN RN 08/23/20 AT 1740 SK (NOTE) SARS-CoV-2 target nucleic acids are DETECTED.  SARS-CoV-2 RNA is generally detectable in upper respiratory specimens  during the acute phase of infection. Positive results are indicative of the presence of the identified virus, but do not rule out bacterial infection or co-infection with other pathogens not detected by the test. Clinical correlation with patient history and other diagnostic information is necessary to determine patient infection status. The expected result is Negative.  Fact Sheet for Patients:  PinkCheek.be  Fact Sheet for Healthcare Providers: GravelBags.it  This test is not yet approved or cleared by the [place] FDA and  has been authorized for detection and/or diagnosis of  SARS-CoV-2 by FDA under an Emergency Use Authorization (EUA).  This EUA will remain in effect (meaning this test can be [place] ed) for the duration of  the COVID-19 declaration under Section 564(b)(1) of the Act, 21 U.S.C. section 360bbb-3(b)(1), unless the authorization is terminated or revoked sooner.      Influenza A by PCR NEGATIVE NEGATIVE Final   Influenza B by PCR NEGATIVE NEGATIVE Final    Comment: (NOTE) The Xpert Xpress SARS-CoV-2/FLU/RSV assay is intended as an aid in  the diagnosis of influenza from Nasopharyngeal swab specimens and  should not be used as a sole basis for treatment. Nasal washings and  aspirates are unacceptable for Xpert Xpress SARS-CoV-2/FLU/RSV  testing.  Fact Sheet for Patients: PinkCheek.be  Fact Sheet for Healthcare Providers: GravelBags.it  This test is not yet approved or cleared by the [place] FDA and  has been authorized for detection and/or diagnosis of SARS-CoV-2 by  FDA under an Emergency Use Authorization (EUA). This EUA will remain  in effect (meaning this test can be used) for the duration of the  Covid-19 declaration under Section 564(b)(1) of the Act, 21  U.S.C. section 360bbb-3(b)(1), unless the authorization is  terminated or revoked.    Respiratory Syncytial Virus by PCR NEGATIVE NEGATIVE Final    Comment: (NOTE) Fact Sheet for Patients: PinkCheek.be  Fact Sheet for Healthcare Providers: GravelBags.it  This test is not yet approved or cleared by the [place] FDA and  has been authorized for detection and/or diagnosis of SARS-CoV-2 by  FDA under an Emergency Use Authorization (EUA). This EUA will remain  in effect (meaning this test can be used) for the duration of the  COVID-19 declaration under Section 564(b)(1) of the Act, 21 U.S.C.  section 360bbb-3(b)(1), unless the authorization is terminated or   revoked. Performed at [facility], [place] [address], [place], [place] [address]   MRSA PCR Screening     Status: None   Collection Time: 08/24/20  8:50 AM   Specimen: Nasal Mucosa; Nasopharyngeal  Result Value Ref Range Status   MRSA by PCR NEGATIVE  NEGATIVE Final    Comment:        The GeneXpert MRSA Assay (FDA approved for NASAL specimens only), is one component of a comprehensive MRSA colonization surveillance program. It is not intended to diagnose MRSA infection nor to guide or monitor treatment for MRSA infections. Performed at [facility], [place] [address], [place], [place] [address]     Today   Subjective    Candace [name] today has no headache,no chest abdominal pain,no new weakness tingling or numbness, feels much better wants to go home today.    Objective   Blood pressure 108/70, pulse 61, temperature 98.7 F (37.1 C), temperature source Oral, resp. rate (!) 22, height 5\' 8"  (1.727 m), weight 99.8 kg, SpO2 94 %.   Intake/Output Summary (Last 24 hours) at 08/29/2020 1118 Last data filed at 08/29/2020 0900 Gross per 24 hour  Intake 600 ml  Output 1350 ml  Net -750 ml    Exam  Awake Alert, No new F.N deficits, Normal affect [place].AT,PERRAL Supple Neck,No JVD, No cervical lymphadenopathy appriciated.  Symmetrical Chest wall movement, Good air movement bilaterally, CTAB RRR,No Gallops,Rubs or new Murmurs, No Parasternal Heave +ve B.Sounds, Abd Soft, Non tender, No organomegaly appriciated, No rebound -guarding or rigidity. No Cyanosis, Clubbing or edema, No new Rash or bruise   Data Review   CBC w Diff:  Lab Results  Component Value Date   WBC 12.8 (H) 08/29/2020   HGB 13.6 08/29/2020   HCT 43.6 08/29/2020   PLT 366 08/29/2020   LYMPHOPCT 3 08/29/2020   MONOPCT 11 08/29/2020   EOSPCT 0 08/29/2020   BASOPCT 0 08/29/2020    CMP:  Lab Results  Component Value Date   NA 143 08/29/2020   K 4.7 08/29/2020   CL 108 08/29/2020   CO2 25  08/29/2020   BUN 56 (H) 08/29/2020   CREATININE 1.45 (H) 08/29/2020   PROT 6.9 08/29/2020   ALBUMIN 2.7 (L) 08/29/2020   BILITOT 0.6 08/29/2020   ALKPHOS 59 08/29/2020   AST 29 08/29/2020   ALT 25 08/29/2020  .   Total Time in preparing paper work, data evaluation and todays exam - 53 minutes  Lala Lund M.D on 08/29/2020 at 11:18 AM  [facility]   Office  [phone]

## 2020-08-29 NOTE — TOC Transition Note (Addendum)
Transition of Care [facility]) - CM/SW Discharge Note   Patient Details  Name: Candace [name] MRN: [id number] Date of Birth: 05-Feb-1951  [facility] CM/SW Contact:  Jon Billings, RN Phone Number: 08/29/2020, 12:45 PM   Clinical Narrative:   Spoke with patient.  Patient ready for discharge. Discussed home health with patient. She has no preference.  Discussed medicare listing with patient.  She [facility] agreeable to [facility].  Patient needing DME of Oxygen, wheelchair, rolling walker, and 3n1.   Oxygen called to [facility] with [facility].  Oxygen to be delivered to room.  Wheelchair ordered to be delivered to the home through [facility].  Walker and 3n1 ordered and to be delivered to the room prior to discharge.  Xarelto 30 day free card sent to patient.      Final next level of care: [place] Barriers to Discharge: No Barriers Identified   Patient Goals and CMS Choice Patient states their goals for this hospitalization and ongoing recovery are:: Go home and get over [place] CMS Medicare.gov Compare Post Acute Care list provided to:: Patient (Patient has no preference home health.  Discussed medicare ratings list.) Choice offered to / list presented to : Patient  Discharge Placement                       Discharge Plan and Services                DME Arranged: 3-N-1, Walker rolling, Lightweight manual wheelchair with seat cushion DME Agency: [employer] Date DME Agency Contacted: 08/29/20 Time DME Agency Contacted: [id number] Representative spoke with at DME Agency: [place] [facility] Arranged: RN, PT [place] Agency: [place] Date [place]: 08/29/20 Time [place]: [phone] Representative spoke with at [place]: [place] ([place]) Interventions     Readmission Risk Interventions No flowsheet data found.

## 2020-08-29 NOTE — Progress Notes (Signed)
SATURATION QUALIFICATIONS: (This note is used to comply with regulatory documentation for home oxygen)  Patient Saturations on Room Air at Rest = 85%  Patient Saturations on Room Air while Ambulating = 82%  Patient Saturations on 3 Liters of oxygen while Ambulating = 92%  Please briefly explain why patient needs home oxygen:

## 2020-08-29 NOTE — Discharge Instructions (Signed)
Get copy of discharge summary to your PCP office.  You have to follow-up with a urologist within 1 to 2 weeks also to be arranged by your PCP.    Follow with Primary MD in 7 days   Get CBC, CMP, 2 view Chest X ray -  checked next visit within 1 week by Primary MD   Activity: As tolerated with Full fall precautions use walker/cane & assistance as needed  Disposition Home   Diet: Heart Healthy Low Carb   Special Instructions: If you have smoked or chewed Tobacco  in the last 2 yrs please stop smoking, stop any regular Alcohol  and or any Recreational drug use.  On your next visit with your primary care physician please Get Medicines reviewed and adjusted.  Please request your Prim.MD to go over all Hospital Tests and Procedure/Radiological results at the follow up, please get all Hospital records sent to your Prim MD by signing hospital release before you go home.  If you experience worsening of your admission symptoms, develop shortness of breath, life threatening emergency, suicidal or homicidal thoughts you must seek medical attention immediately by calling 911 or calling your MD immediately  if symptoms less severe.  You Must read complete instructions/literature along with all the possible adverse reactions/side effects for all the Medicines you take and that have been prescribed to you. Take any new Medicines after you have completely understood and accpet all the possible adverse reactions/side effects.          Person Under Monitoring Name: Candace [name]  Location: [address] [place] [address]   Infection Prevention Recommendations for Individuals Confirmed to have, or Being Evaluated for, 2019 Novel Coronavirus (COVID-19) Infection Who Receive Care at Home  Individuals who are confirmed to have, or are being evaluated for, COVID-19 should follow the prevention steps below until a healthcare provider or local or state health department says they can return to normal  activities.  Stay home except to get medical care You should restrict activities outside your home, except for getting medical care. Do not go to work, school, or public areas, and do not use public transportation or taxis.  Call ahead before visiting your doctor Before your medical appointment, call the healthcare provider and tell them that you have, or are being evaluated for, COVID-19 infection. This will help the healthcare provider's office take steps to keep other people from getting infected. Ask your healthcare provider to call the local or state health department.  Monitor your symptoms Seek prompt medical attention if your illness is worsening (e.g., difficulty breathing). Before going to your medical appointment, call the healthcare provider and tell them that you have, or are being evaluated for, COVID-19 infection. Ask your healthcare provider to call the local or state health department.  Wear a facemask You should wear a facemask that covers your nose and mouth when you are in the same room with other people and when you visit a healthcare provider. People who live with or visit you should also wear a facemask while they are in the same room with you.  Separate yourself from other people in your home As much as possible, you should stay in a different room from other people in your home. Also, you should use a separate bathroom, if available.  Avoid sharing household items You should not share dishes, drinking glasses, cups, eating utensils, towels, bedding, or other items with other people in your home. After using these items, you should  wash them thoroughly with soap and water.  Cover your coughs and sneezes Cover your mouth and nose with a tissue when you cough or sneeze, or you can cough or sneeze into your sleeve. Throw used tissues in a lined trash can, and immediately wash your hands with soap and water for at least 20 seconds or use an alcohol-based hand  rub.  Wash your [facility] your hands often and thoroughly with soap and water for at least 20 seconds. You can use an alcohol-based hand sanitizer if soap and water are not available and if your hands are not visibly dirty. Avoid touching your eyes, nose, and mouth with unwashed hands.   Prevention Steps for Caregivers and Household Members of Individuals Confirmed to have, or Being Evaluated for, COVID-19 Infection Being Cared for in the Home  If you live with, or provide care at home for, a person confirmed to have, or being evaluated for, COVID-19 infection please follow these guidelines to prevent infection:  Follow healthcare provider's instructions Make sure that you understand and can help the patient follow any healthcare provider instructions for all care.  Provide for the patient's basic needs You should help the patient with basic needs in the home and provide support for getting groceries, prescriptions, and other personal needs.  Monitor the patient's symptoms If they are getting sicker, call his or her medical provider and tell them that the patient has, or is being evaluated for, COVID-19 infection. This will help the healthcare provider's office take steps to keep other people from getting infected. Ask the healthcare provider to call the local or state health department.  Limit the number of people who have contact with the patient  If possible, have only one caregiver for the patient.  Other household members should stay in another home or place of residence. If this is not possible, they should stay  in another room, or be separated from the patient as much as possible. Use a separate bathroom, if available.  Restrict visitors who do not have an essential need to be in the home.  Keep older adults, very young children, and other sick people away from the patient Keep older adults, very young children, and those who have compromised immune systems or chronic  health conditions away from the patient. This includes people with chronic heart, lung, or kidney conditions, diabetes, and cancer.  Ensure good ventilation Make sure that shared spaces in the home have good air flow, such as from an air conditioner or an opened window, weather permitting.  Wash your hands often  Wash your hands often and thoroughly with soap and water for at least 20 seconds. You can use an alcohol based hand sanitizer if soap and water are not available and if your hands are not visibly dirty.  Avoid touching your eyes, nose, and mouth with unwashed hands.  Use disposable paper towels to dry your hands. If not available, use dedicated cloth towels and replace them when they become wet.  Wear a facemask and gloves  Wear a disposable facemask at all times in the room and gloves when you touch or have contact with the patient's blood, body fluids, and/or secretions or excretions, such as sweat, saliva, sputum, nasal mucus, vomit, urine, or feces.  Ensure the mask fits over your nose and mouth tightly, and do not touch it during use.  Throw out disposable facemasks and gloves after using them. Do not reuse.  Wash your hands immediately after removing your facemask  and gloves.  If your personal clothing becomes contaminated, carefully remove clothing and launder. Wash your hands after handling contaminated clothing.  Place all used disposable facemasks, gloves, and other waste in a lined container before disposing them with other household waste.  Remove gloves and wash your hands immediately after handling these items.  Do not share dishes, glasses, or other household items with the patient  Avoid sharing household items. You should not share dishes, drinking glasses, cups, eating utensils, towels, bedding, or other items with a patient who is confirmed to have, or being evaluated for, COVID-19 infection.  After the person uses these items, you should wash them  thoroughly with soap and water.  Wash laundry thoroughly  Immediately remove and wash clothes or bedding that have blood, body fluids, and/or secretions or excretions, such as sweat, saliva, sputum, nasal mucus, vomit, urine, or feces, on them.  Wear gloves when handling laundry from the patient.  Read and follow directions on labels of laundry or clothing items and detergent. In general, wash and dry with the warmest temperatures recommended on the label.  Clean all areas the individual has used often  Clean all touchable surfaces, such as counters, tabletops, doorknobs, bathroom fixtures, toilets, phones, keyboards, tablets, and bedside tables, every day. Also, clean any surfaces that may have blood, body fluids, and/or secretions or excretions on them.  Wear gloves when cleaning surfaces the patient has come in contact with.  Use a diluted bleach solution (e.g., dilute bleach with 1 part bleach and 10 parts water) or a household disinfectant with a label that says EPA-registered for coronaviruses. To make a bleach solution at home, add 1 tablespoon of bleach to 1 quart (4 cups) of water. For a larger supply, add  cup of bleach to 1 gallon (16 cups) of water.  Read labels of cleaning products and follow recommendations provided on product labels. Labels contain instructions for safe and effective use of the cleaning product including precautions you should take when applying the product, such as wearing gloves or eye protection and making sure you have good ventilation during use of the product.  Remove gloves and wash hands immediately after cleaning.  Monitor yourself for signs and symptoms of illness Caregivers and household members are considered close contacts, should monitor their health, and will be asked to limit movement outside of the home to the extent possible. Follow the monitoring steps for close contacts listed on the symptom monitoring form.   ? If you have additional  questions, contact your local health department or call the epidemiologist on call at [phone] (available 24/7). ? This guidance is subject to change. For the most up-to-date guidance from [facility], please refer to their website: [email or web address]

## 2020-08-31 ENCOUNTER — Inpatient Hospital Stay (HOSPITAL_COMMUNITY)
Admission: EM | Admit: 2020-08-31 | Discharge: 2020-12-23 | DRG: 003 | Disposition: A | Discharge status: Home Health | Payer: Medicare Other | Attending: Internal Medicine | Admitting: Internal Medicine

## 2020-08-31 ENCOUNTER — Emergency Department (HOSPITAL_COMMUNITY): Payer: Medicare Other

## 2020-08-31 ENCOUNTER — Encounter (HOSPITAL_COMMUNITY): Payer: Self-pay | Admitting: Internal Medicine

## 2020-08-31 DIAGNOSIS — R110 Nausea: Secondary | ICD-10-CM

## 2020-08-31 DIAGNOSIS — R6521 Severe sepsis with septic shock: Secondary | ICD-10-CM | POA: Diagnosis not present

## 2020-08-31 DIAGNOSIS — Y95 Nosocomial condition: Secondary | ICD-10-CM

## 2020-08-31 DIAGNOSIS — M545 Low back pain, unspecified: Secondary | ICD-10-CM | POA: Diagnosis not present

## 2020-08-31 DIAGNOSIS — H6121 Impacted cerumen, right ear: Secondary | ICD-10-CM | POA: Diagnosis not present

## 2020-08-31 DIAGNOSIS — E785 Hyperlipidemia, unspecified: Secondary | ICD-10-CM | POA: Diagnosis present

## 2020-08-31 DIAGNOSIS — L89812 Pressure ulcer of head, stage 2: Secondary | ICD-10-CM | POA: Diagnosis not present

## 2020-08-31 DIAGNOSIS — Y92009 Unspecified place in unspecified non-institutional (private) residence as the place of occurrence of the external cause: Secondary | ICD-10-CM

## 2020-08-31 DIAGNOSIS — Z01818 Encounter for other preprocedural examination: Secondary | ICD-10-CM

## 2020-08-31 DIAGNOSIS — E2740 Unspecified adrenocortical insufficiency: Secondary | ICD-10-CM | POA: Diagnosis not present

## 2020-08-31 DIAGNOSIS — L8990 Pressure ulcer of unspecified site, unspecified stage: Secondary | ICD-10-CM | POA: Insufficient documentation

## 2020-08-31 DIAGNOSIS — R0603 Acute respiratory distress: Secondary | ICD-10-CM

## 2020-08-31 DIAGNOSIS — R1310 Dysphagia, unspecified: Secondary | ICD-10-CM | POA: Diagnosis not present

## 2020-08-31 DIAGNOSIS — K266 Chronic or unspecified duodenal ulcer with both hemorrhage and perforation: Secondary | ICD-10-CM | POA: Diagnosis present

## 2020-08-31 DIAGNOSIS — I700 Atherosclerosis of aorta: Secondary | ICD-10-CM | POA: Diagnosis present

## 2020-08-31 DIAGNOSIS — R579 Shock, unspecified: Secondary | ICD-10-CM

## 2020-08-31 DIAGNOSIS — A4102 Sepsis due to Methicillin resistant Staphylococcus aureus: Secondary | ICD-10-CM | POA: Diagnosis not present

## 2020-08-31 DIAGNOSIS — R34 Anuria and oliguria: Secondary | ICD-10-CM | POA: Diagnosis not present

## 2020-08-31 DIAGNOSIS — Z515 Encounter for palliative care: Secondary | ICD-10-CM

## 2020-08-31 DIAGNOSIS — Z0189 Encounter for other specified special examinations: Secondary | ICD-10-CM

## 2020-08-31 DIAGNOSIS — E8809 Other disorders of plasma-protein metabolism, not elsewhere classified: Secondary | ICD-10-CM | POA: Diagnosis not present

## 2020-08-31 DIAGNOSIS — I469 Cardiac arrest, cause unspecified: Secondary | ICD-10-CM | POA: Diagnosis not present

## 2020-08-31 DIAGNOSIS — T380X5A Adverse effect of glucocorticoids and synthetic analogues, initial encounter: Secondary | ICD-10-CM | POA: Diagnosis present

## 2020-08-31 DIAGNOSIS — Z9911 Dependence on respirator [ventilator] status: Secondary | ICD-10-CM

## 2020-08-31 DIAGNOSIS — R0902 Hypoxemia: Secondary | ICD-10-CM | POA: Diagnosis not present

## 2020-08-31 DIAGNOSIS — Z7189 Other specified counseling: Secondary | ICD-10-CM

## 2020-08-31 DIAGNOSIS — J95851 Ventilator associated pneumonia: Secondary | ICD-10-CM | POA: Diagnosis not present

## 2020-08-31 DIAGNOSIS — J9692 Respiratory failure, unspecified with hypercapnia: Secondary | ICD-10-CM

## 2020-08-31 DIAGNOSIS — F419 Anxiety disorder, unspecified: Secondary | ICD-10-CM | POA: Diagnosis present

## 2020-08-31 DIAGNOSIS — R680 Hypothermia, not associated with low environmental temperature: Secondary | ICD-10-CM | POA: Diagnosis not present

## 2020-08-31 DIAGNOSIS — I2510 Atherosclerotic heart disease of native coronary artery without angina pectoris: Secondary | ICD-10-CM | POA: Diagnosis present

## 2020-08-31 DIAGNOSIS — D72829 Elevated white blood cell count, unspecified: Secondary | ICD-10-CM | POA: Diagnosis present

## 2020-08-31 DIAGNOSIS — Z7901 Long term (current) use of anticoagulants: Secondary | ICD-10-CM

## 2020-08-31 DIAGNOSIS — J189 Pneumonia, unspecified organism: Secondary | ICD-10-CM

## 2020-08-31 DIAGNOSIS — I471 Supraventricular tachycardia: Secondary | ICD-10-CM | POA: Diagnosis not present

## 2020-08-31 DIAGNOSIS — D688 Other specified coagulation defects: Secondary | ICD-10-CM | POA: Diagnosis present

## 2020-08-31 DIAGNOSIS — Z820 Family history of epilepsy and other diseases of the nervous system: Secondary | ICD-10-CM

## 2020-08-31 DIAGNOSIS — J9600 Acute respiratory failure, unspecified whether with hypoxia or hypercapnia: Secondary | ICD-10-CM

## 2020-08-31 DIAGNOSIS — R578 Other shock: Secondary | ICD-10-CM | POA: Diagnosis not present

## 2020-08-31 DIAGNOSIS — J811 Chronic pulmonary edema: Secondary | ICD-10-CM | POA: Diagnosis not present

## 2020-08-31 DIAGNOSIS — I120 Hypertensive chronic kidney disease with stage 5 chronic kidney disease or end stage renal disease: Secondary | ICD-10-CM | POA: Diagnosis not present

## 2020-08-31 DIAGNOSIS — Z832 Family history of diseases of the blood and blood-forming organs and certain disorders involving the immune mechanism: Secondary | ICD-10-CM

## 2020-08-31 DIAGNOSIS — R946 Abnormal results of thyroid function studies: Secondary | ICD-10-CM | POA: Diagnosis not present

## 2020-08-31 DIAGNOSIS — E872 Acidosis: Secondary | ICD-10-CM | POA: Diagnosis present

## 2020-08-31 DIAGNOSIS — R1110 Vomiting, unspecified: Secondary | ICD-10-CM

## 2020-08-31 DIAGNOSIS — J9620 Acute and chronic respiratory failure, unspecified whether with hypoxia or hypercapnia: Secondary | ICD-10-CM

## 2020-08-31 DIAGNOSIS — I82451 Acute embolism and thrombosis of right peroneal vein: Secondary | ICD-10-CM | POA: Diagnosis not present

## 2020-08-31 DIAGNOSIS — D631 Anemia in chronic kidney disease: Secondary | ICD-10-CM | POA: Diagnosis present

## 2020-08-31 DIAGNOSIS — Z452 Encounter for adjustment and management of vascular access device: Secondary | ICD-10-CM

## 2020-08-31 DIAGNOSIS — J40 Bronchitis, not specified as acute or chronic: Secondary | ICD-10-CM | POA: Diagnosis not present

## 2020-08-31 DIAGNOSIS — J9621 Acute and chronic respiratory failure with hypoxia: Secondary | ICD-10-CM

## 2020-08-31 DIAGNOSIS — N179 Acute kidney failure, unspecified: Secondary | ICD-10-CM

## 2020-08-31 DIAGNOSIS — Z86711 Personal history of pulmonary embolism: Secondary | ICD-10-CM

## 2020-08-31 DIAGNOSIS — J15211 Pneumonia due to Methicillin susceptible Staphylococcus aureus: Secondary | ICD-10-CM | POA: Diagnosis not present

## 2020-08-31 DIAGNOSIS — E876 Hypokalemia: Secondary | ICD-10-CM | POA: Diagnosis not present

## 2020-08-31 DIAGNOSIS — K659 Peritonitis, unspecified: Secondary | ICD-10-CM | POA: Diagnosis not present

## 2020-08-31 DIAGNOSIS — R7989 Other specified abnormal findings of blood chemistry: Secondary | ICD-10-CM | POA: Diagnosis present

## 2020-08-31 DIAGNOSIS — E870 Hyperosmolality and hypernatremia: Secondary | ICD-10-CM | POA: Diagnosis not present

## 2020-08-31 DIAGNOSIS — R0602 Shortness of breath: Secondary | ICD-10-CM

## 2020-08-31 DIAGNOSIS — J80 Acute respiratory distress syndrome: Secondary | ICD-10-CM

## 2020-08-31 DIAGNOSIS — Z933 Colostomy status: Secondary | ICD-10-CM

## 2020-08-31 DIAGNOSIS — U071 COVID-19: Secondary | ICD-10-CM | POA: Diagnosis not present

## 2020-08-31 DIAGNOSIS — Z9049 Acquired absence of other specified parts of digestive tract: Secondary | ICD-10-CM

## 2020-08-31 DIAGNOSIS — E1165 Type 2 diabetes mellitus with hyperglycemia: Secondary | ICD-10-CM | POA: Diagnosis present

## 2020-08-31 DIAGNOSIS — Z930 Tracheostomy status: Secondary | ICD-10-CM

## 2020-08-31 DIAGNOSIS — K862 Cyst of pancreas: Secondary | ICD-10-CM | POA: Diagnosis present

## 2020-08-31 DIAGNOSIS — Z7982 Long term (current) use of aspirin: Secondary | ICD-10-CM

## 2020-08-31 DIAGNOSIS — K7200 Acute and subacute hepatic failure without coma: Secondary | ICD-10-CM | POA: Diagnosis not present

## 2020-08-31 DIAGNOSIS — Z7984 Long term (current) use of oral hypoglycemic drugs: Secondary | ICD-10-CM

## 2020-08-31 DIAGNOSIS — E11649 Type 2 diabetes mellitus with hypoglycemia without coma: Secondary | ICD-10-CM | POA: Diagnosis not present

## 2020-08-31 DIAGNOSIS — Z4659 Encounter for fitting and adjustment of other gastrointestinal appliance and device: Secondary | ICD-10-CM

## 2020-08-31 DIAGNOSIS — D696 Thrombocytopenia, unspecified: Secondary | ICD-10-CM | POA: Diagnosis not present

## 2020-08-31 DIAGNOSIS — J1282 Pneumonia due to coronavirus disease 2019: Secondary | ICD-10-CM | POA: Diagnosis present

## 2020-08-31 DIAGNOSIS — R001 Bradycardia, unspecified: Secondary | ICD-10-CM | POA: Diagnosis not present

## 2020-08-31 DIAGNOSIS — Z85118 Personal history of other malignant neoplasm of bronchus and lung: Secondary | ICD-10-CM

## 2020-08-31 DIAGNOSIS — I4891 Unspecified atrial fibrillation: Secondary | ICD-10-CM | POA: Diagnosis not present

## 2020-08-31 DIAGNOSIS — Z6833 Body mass index (BMI) 33.0-33.9, adult: Secondary | ICD-10-CM

## 2020-08-31 DIAGNOSIS — T17990A Other foreign object in respiratory tract, part unspecified in causing asphyxiation, initial encounter: Secondary | ICD-10-CM | POA: Diagnosis not present

## 2020-08-31 DIAGNOSIS — G928 Other toxic encephalopathy: Secondary | ICD-10-CM | POA: Diagnosis not present

## 2020-08-31 DIAGNOSIS — Z95828 Presence of other vascular implants and grafts: Secondary | ICD-10-CM

## 2020-08-31 DIAGNOSIS — I82443 Acute embolism and thrombosis of tibial vein, bilateral: Secondary | ICD-10-CM | POA: Diagnosis not present

## 2020-08-31 DIAGNOSIS — F1193 Opioid use, unspecified with withdrawal: Secondary | ICD-10-CM | POA: Diagnosis not present

## 2020-08-31 DIAGNOSIS — Z85038 Personal history of other malignant neoplasm of large intestine: Secondary | ICD-10-CM

## 2020-08-31 DIAGNOSIS — Y848 Other medical procedures as the cause of abnormal reaction of the patient, or of later complication, without mention of misadventure at the time of the procedure: Secondary | ICD-10-CM | POA: Diagnosis not present

## 2020-08-31 DIAGNOSIS — E875 Hyperkalemia: Secondary | ICD-10-CM | POA: Diagnosis not present

## 2020-08-31 DIAGNOSIS — L89154 Pressure ulcer of sacral region, stage 4: Secondary | ICD-10-CM | POA: Diagnosis not present

## 2020-08-31 DIAGNOSIS — N170 Acute kidney failure with tubular necrosis: Secondary | ICD-10-CM | POA: Diagnosis present

## 2020-08-31 DIAGNOSIS — I480 Paroxysmal atrial fibrillation: Secondary | ICD-10-CM | POA: Diagnosis present

## 2020-08-31 DIAGNOSIS — K265 Chronic or unspecified duodenal ulcer with perforation: Secondary | ICD-10-CM

## 2020-08-31 DIAGNOSIS — I248 Other forms of acute ischemic heart disease: Secondary | ICD-10-CM | POA: Diagnosis not present

## 2020-08-31 DIAGNOSIS — N390 Urinary tract infection, site not specified: Secondary | ICD-10-CM | POA: Diagnosis not present

## 2020-08-31 DIAGNOSIS — Z86718 Personal history of other venous thrombosis and embolism: Secondary | ICD-10-CM

## 2020-08-31 DIAGNOSIS — M79671 Pain in right foot: Secondary | ICD-10-CM | POA: Diagnosis not present

## 2020-08-31 DIAGNOSIS — G7281 Critical illness myopathy: Secondary | ICD-10-CM | POA: Diagnosis not present

## 2020-08-31 DIAGNOSIS — J9690 Respiratory failure, unspecified, unspecified whether with hypoxia or hypercapnia: Secondary | ICD-10-CM

## 2020-08-31 DIAGNOSIS — J9601 Acute respiratory failure with hypoxia: Secondary | ICD-10-CM

## 2020-08-31 DIAGNOSIS — E43 Unspecified severe protein-calorie malnutrition: Secondary | ICD-10-CM | POA: Diagnosis present

## 2020-08-31 DIAGNOSIS — D6489 Other specified anemias: Secondary | ICD-10-CM | POA: Diagnosis present

## 2020-08-31 DIAGNOSIS — A4189 Other specified sepsis: Secondary | ICD-10-CM | POA: Diagnosis not present

## 2020-08-31 DIAGNOSIS — B179 Acute viral hepatitis, unspecified: Secondary | ICD-10-CM | POA: Diagnosis present

## 2020-08-31 DIAGNOSIS — M7918 Myalgia, other site: Secondary | ICD-10-CM | POA: Diagnosis not present

## 2020-08-31 DIAGNOSIS — D62 Acute posthemorrhagic anemia: Secondary | ICD-10-CM | POA: Diagnosis not present

## 2020-08-31 DIAGNOSIS — I82433 Acute embolism and thrombosis of popliteal vein, bilateral: Secondary | ICD-10-CM

## 2020-08-31 DIAGNOSIS — A4901 Methicillin susceptible Staphylococcus aureus infection, unspecified site: Secondary | ICD-10-CM | POA: Diagnosis not present

## 2020-08-31 DIAGNOSIS — I953 Hypotension of hemodialysis: Secondary | ICD-10-CM | POA: Diagnosis not present

## 2020-08-31 DIAGNOSIS — Z79899 Other long term (current) drug therapy: Secondary | ICD-10-CM

## 2020-08-31 DIAGNOSIS — E871 Hypo-osmolality and hyponatremia: Secondary | ICD-10-CM | POA: Diagnosis not present

## 2020-08-31 DIAGNOSIS — R069 Unspecified abnormalities of breathing: Secondary | ICD-10-CM

## 2020-08-31 DIAGNOSIS — J15212 Pneumonia due to Methicillin resistant Staphylococcus aureus: Secondary | ICD-10-CM | POA: Diagnosis not present

## 2020-08-31 DIAGNOSIS — E8770 Fluid overload, unspecified: Secondary | ICD-10-CM | POA: Diagnosis not present

## 2020-08-31 DIAGNOSIS — U099 Post covid-19 condition, unspecified: Secondary | ICD-10-CM | POA: Diagnosis not present

## 2020-08-31 DIAGNOSIS — J9691 Respiratory failure, unspecified with hypoxia: Secondary | ICD-10-CM

## 2020-08-31 DIAGNOSIS — Z789 Other specified health status: Secondary | ICD-10-CM

## 2020-08-31 DIAGNOSIS — E1122 Type 2 diabetes mellitus with diabetic chronic kidney disease: Secondary | ICD-10-CM | POA: Diagnosis present

## 2020-08-31 DIAGNOSIS — N186 End stage renal disease: Secondary | ICD-10-CM

## 2020-08-31 DIAGNOSIS — E669 Obesity, unspecified: Secondary | ICD-10-CM | POA: Diagnosis present

## 2020-08-31 HISTORY — DX: Malignant neoplasm of colon, unspecified: C18.9

## 2020-08-31 LAB — CBC WITH DIFFERENTIAL/PLATELET
Abs Immature Granulocytes: 0.4 10*3/uL — ABNORMAL HIGH (ref 0.00–0.07)
Basophils Absolute: 0 10*3/uL (ref 0.0–0.1)
Basophils Relative: 0 %
Eosinophils Absolute: 0 10*3/uL (ref 0.0–0.5)
Eosinophils Relative: 0 %
HCT: 42.4 % (ref 36.0–46.0)
Hemoglobin: 13.4 g/dL (ref 12.0–15.0)
Immature Granulocytes: 3 %
Lymphocytes Relative: 3 %
Lymphs Abs: 0.4 10*3/uL — ABNORMAL LOW (ref 0.7–4.0)
MCH: 23.1 pg — ABNORMAL LOW (ref 26.0–34.0)
MCHC: 31.6 g/dL (ref 30.0–36.0)
MCV: 73.2 fL — ABNORMAL LOW (ref 80.0–100.0)
Monocytes Absolute: 1.2 10*3/uL — ABNORMAL HIGH (ref 0.1–1.0)
Monocytes Relative: 9 %
Neutro Abs: 12.5 10*3/uL — ABNORMAL HIGH (ref 1.7–7.7)
Neutrophils Relative %: 85 %
Platelets: 365 10*3/uL (ref 150–400)
RBC: 5.79 MIL/uL — ABNORMAL HIGH (ref 3.87–5.11)
RDW: 15.1 % (ref 11.5–15.5)
WBC: 14.5 10*3/uL — ABNORMAL HIGH (ref 4.0–10.5)
nRBC: 0 % (ref 0.0–0.2)

## 2020-08-31 LAB — COMPREHENSIVE METABOLIC PANEL
ALT: 21 U/L (ref 0–44)
AST: 29 U/L (ref 15–41)
Albumin: 2.5 g/dL — ABNORMAL LOW (ref 3.5–5.0)
Alkaline Phosphatase: 57 U/L (ref 38–126)
Anion gap: 15 (ref 5–15)
BUN: 60 mg/dL — ABNORMAL HIGH (ref 8–23)
CO2: 19 mmol/L — ABNORMAL LOW (ref 22–32)
Calcium: 10.1 mg/dL (ref 8.9–10.3)
Chloride: 102 mmol/L (ref 98–111)
Creatinine, Ser: 1.69 mg/dL — ABNORMAL HIGH (ref 0.44–1.00)
GFR, Estimated: 32 mL/min — ABNORMAL LOW (ref >60)
Glucose, Bld: 316 mg/dL — ABNORMAL HIGH (ref 70–99)
Potassium: 4.9 mmol/L (ref 3.5–5.1)
Sodium: 136 mmol/L (ref 135–145)
Total Bilirubin: 1.2 mg/dL (ref 0.3–1.2)
Total Protein: 6.5 g/dL (ref 6.5–8.1)

## 2020-08-31 LAB — TROPONIN I (HIGH SENSITIVITY)
Troponin I (High Sensitivity): 45 ng/L — ABNORMAL HIGH (ref <18)
Troponin I (High Sensitivity): 45 ng/L — ABNORMAL HIGH (ref <18)
Troponin I (High Sensitivity): 36 ng/L — ABNORMAL HIGH (ref <18)
Troponin I (High Sensitivity): 50 ng/L — ABNORMAL HIGH (ref <18)

## 2020-08-31 LAB — CBG MONITORING, ED: Glucose-Capillary: 244 mg/dL — ABNORMAL HIGH (ref 70–99)

## 2020-08-31 LAB — MAGNESIUM: Magnesium: 1.8 mg/dL (ref 1.7–2.4)

## 2020-08-31 LAB — C-REACTIVE PROTEIN: CRP: 9.1 mg/dL — ABNORMAL HIGH (ref <1.0)

## 2020-08-31 LAB — TSH: TSH: 0.015 u[IU]/mL — ABNORMAL LOW (ref 0.350–4.500)

## 2020-08-31 LAB — LACTATE DEHYDROGENASE: LDH: 533 U/L — ABNORMAL HIGH (ref 98–192)

## 2020-08-31 MED ORDER — DILTIAZEM HCL-DEXTROSE 125-5 MG/125ML-% IV SOLN (PREMIX)
5.0000 mg/h | INTRAVENOUS | Status: DC
  Administered 2020-08-31: 5 mg/h via INTRAVENOUS
  Administered 2020-09-02 – 2020-09-04 (×4): 7.5 mg/h via INTRAVENOUS
  Administered 2020-09-05: 15 mg via INTRAVENOUS
  Administered 2020-09-05: 7.5 mg/h via INTRAVENOUS
  Administered 2020-09-06 – 2020-09-07 (×4): 15 mg/h via INTRAVENOUS
  Filled 2020-08-31 (×14): qty 125

## 2020-08-31 MED ORDER — APIXABAN 5 MG PO TABS
5.0000 mg | ORAL_TABLET | Freq: Two times a day (BID) | ORAL | Status: DC
  Administered 2020-08-31 – 2020-09-01 (×2): 5 mg via ORAL
  Filled 2020-08-31 (×2): qty 1

## 2020-08-31 MED ORDER — LEVOFLOXACIN 250 MG PO TABS: 250.0000 mg | ORAL_TABLET | Freq: Every day | ORAL | Status: DC

## 2020-08-31 MED ORDER — METOPROLOL TARTRATE 25 MG PO TABS
25.0000 mg | ORAL_TABLET | Freq: Two times a day (BID) | ORAL | Status: DC
  Administered 2020-08-31 – 2020-09-01 (×2): 25 mg via ORAL
  Filled 2020-08-31 (×3): qty 1

## 2020-08-31 MED ORDER — METOPROLOL TARTRATE 5 MG/5ML IV SOLN: 2.5000 mg | INTRAVENOUS | Status: DC | PRN

## 2020-08-31 MED ORDER — SODIUM CHLORIDE 0.9 % IV BOLUS
1000.0000 mL | Freq: Once | INTRAVENOUS | Status: AC
  Administered 2020-08-31: 1000 mL via INTRAVENOUS

## 2020-08-31 MED ORDER — METOPROLOL TARTRATE 5 MG/5ML IV SOLN
2.5000 mg | INTRAVENOUS | Status: AC
  Administered 2020-08-31: 2.5 mg via INTRAVENOUS
  Filled 2020-08-31: qty 5

## 2020-08-31 MED ORDER — LACTATED RINGERS IV SOLN: INTRAVENOUS | Status: AC

## 2020-08-31 MED ORDER — SODIUM CHLORIDE 0.9 % IV SOLN
500.0000 mg | INTRAVENOUS | Status: DC
  Administered 2020-08-31 – 2020-09-01 (×2): 500 mg via INTRAVENOUS
  Filled 2020-08-31 (×3): qty 500

## 2020-08-31 MED ORDER — SODIUM CHLORIDE 0.9 % IV SOLN
1.0000 g | INTRAVENOUS | Status: DC
  Administered 2020-08-31: 1 g via INTRAVENOUS
  Filled 2020-08-31: qty 10

## 2020-08-31 MED ORDER — ALBUTEROL SULFATE HFA 108 (90 BASE) MCG/ACT IN AERS
2.0000 | INHALATION_SPRAY | Freq: Four times a day (QID) | RESPIRATORY_TRACT | Status: DC | PRN
  Filled 2020-08-31: qty 6.7

## 2020-08-31 MED ORDER — SODIUM BICARBONATE 650 MG PO TABS
650.0000 mg | ORAL_TABLET | Freq: Two times a day (BID) | ORAL | Status: DC
  Administered 2020-08-31 – 2020-09-01 (×2): 650 mg via ORAL
  Filled 2020-08-31 (×4): qty 1

## 2020-08-31 MED ORDER — APIXABAN 2.5 MG PO TABS
2.5000 mg | ORAL_TABLET | Freq: Two times a day (BID) | ORAL | Status: DC
  Filled 2020-08-31: qty 1

## 2020-08-31 MED ORDER — AMLODIPINE BESYLATE 10 MG PO TABS
10.0000 mg | ORAL_TABLET | Freq: Every day | ORAL | Status: DC
  Administered 2020-09-01: 10 mg via ORAL
  Filled 2020-08-31: qty 1

## 2020-08-31 MED ORDER — ASPIRIN EC 81 MG PO TBEC
81.0000 mg | DELAYED_RELEASE_TABLET | Freq: Every day | ORAL | Status: DC
  Administered 2020-09-01: 81 mg via ORAL
  Filled 2020-08-31: qty 1

## 2020-08-31 MED ORDER — INSULIN ASPART 100 UNIT/ML ~~LOC~~ SOLN
0.0000 [IU] | Freq: Three times a day (TID) | SUBCUTANEOUS | Status: DC
  Administered 2020-09-01: 11 [IU] via SUBCUTANEOUS
  Administered 2020-09-01: 7 [IU] via SUBCUTANEOUS
  Administered 2020-09-01: 15 [IU] via SUBCUTANEOUS
  Administered 2020-09-02 (×2): 7 [IU] via SUBCUTANEOUS

## 2020-08-31 MED ORDER — ROSUVASTATIN CALCIUM 20 MG PO TABS
40.0000 mg | ORAL_TABLET | Freq: Every day | ORAL | Status: DC
  Administered 2020-09-01: 40 mg via ORAL
  Filled 2020-08-31: qty 2

## 2020-08-31 MED ORDER — ZINC SULFATE 220 (50 ZN) MG PO CAPS
220.0000 mg | ORAL_CAPSULE | Freq: Every day | ORAL | Status: DC
  Administered 2020-09-01: 220 mg via ORAL
  Filled 2020-08-31: qty 1

## 2020-08-31 MED ORDER — DEXAMETHASONE SODIUM PHOSPHATE 10 MG/ML IJ SOLN
8.0000 mg | INTRAMUSCULAR | Status: DC
  Administered 2020-08-31: 8 mg via INTRAVENOUS
  Filled 2020-08-31: qty 1

## 2020-08-31 MED ORDER — IOHEXOL 350 MG/ML SOLN
50.0000 mL | Freq: Once | INTRAVENOUS | Status: AC | PRN
  Administered 2020-08-31: 50 mL via INTRAVENOUS

## 2020-08-31 MED ORDER — METFORMIN HCL ER 500 MG PO TB24: 500.0000 mg | ORAL_TABLET | Freq: Every day | ORAL | Status: DC

## 2020-08-31 NOTE — ED Notes (Signed)
Dr Tegeler update, unable to obtain labs, Cardizem rate remaining at 7.5 due to blood pressure, and team agreed on plan.

## 2020-08-31 NOTE — ED Notes (Signed)
IV team unable to obtain labs at this time with IV placement

## 2020-08-31 NOTE — ED Provider Notes (Signed)
[facility] EMERGENCY DEPARTMENT Provider Note   CSN: [id number] Arrival date & time: 08/31/20  1135     History No chief complaint on file.   Candace [name] is a 69 y.o. female.  The history is provided by the patient and medical records. The history is limited by the condition of the patient. No language interpreter was used.  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 Diabetes mellitus (Candace [name])   . Hyperlipemia   . Hypertension     Patient Active Problem List   Diagnosis Date Noted  . AKI (acute kidney injury) ([place]) 08/23/2020    No past surgical history on file.   OB History   No obstetric history on file.     No family history on file.  Social History   Tobacco Use  . Smoking status: Never Smoker  . Smokeless tobacco: Never Used  Vaping Use  . Vaping Use: Never used  Substance Use Topics  . Alcohol use: Never  . Drug use: Never    Home Medications Prior to Admission medications   Medication Sig Start Date End Date Taking? Authorizing Provider  acetaminophen (TYLENOL) 500 MG tablet Take 1,000 mg by mouth every 6 (six) hours as needed for mild pain.    [provider]  albuterol (VENTOLIN HFA) 108 (90 Base) MCG/ACT inhaler Inhale 2 puffs into the lungs every 6 (six) hours as needed for wheezing or shortness of breath. 08/29/20   Thurnell Lose, MD  amLODipine (NORVASC) 10 MG tablet Take 1 tablet (10 mg total) by mouth daily. 08/29/20 08/29/21   Thurnell Lose, MD  apixaban (ELIQUIS) 2.5 MG TABS tablet Take 1 tablet (2.5 mg total) by mouth 2 (two) times daily. 08/29/20   Thurnell Lose, MD  aspirin 81 MG EC tablet Take 81 mg by mouth daily. 06/20/18   [provider]  levofloxacin (LEVAQUIN) 250 MG tablet Take 1 tablet (250 mg total) by mouth daily. 08/29/20   Thurnell Lose, MD  metFORMIN (GLUCOPHAGE-XR) 500 MG 24 hr tablet Take 500 mg by mouth daily. 07/16/20   [provider]  rosuvastatin (CRESTOR) 40 MG tablet Take 40 mg by mouth daily. 06/22/20   [provider]  zinc gluconate 50 MG tablet Take 50 mg by mouth daily.    [provider]    Allergies    Patient has no known allergies.  Review of Systems   Review of Systems  Unable to perform ROS: Age  Constitutional: Positive for fatigue. Negative for chills, diaphoresis and fever.  HENT: Negative for congestion.   Eyes: Negative for visual disturbance.  Respiratory: Positive for cough, chest tightness and shortness of breath. Negative for wheezing.   Cardiovascular: Negative for chest pain and claudication.  Gastrointestinal: Negative for abdominal pain, constipation, diarrhea, nausea and vomiting.  Genitourinary: Negative for dysuria.  Musculoskeletal: Negative for back pain and neck pain.  Skin: Negative for rash and wound.  Neurological: Negative for dizziness, light-headedness  and headaches.  Psychiatric/Behavioral: Negative for agitation and confusion.  All other systems reviewed and are negative.   Physical Exam Updated Vital Signs BP 110/68   Pulse (!) 152   Temp 98 F (36.7 C) (Oral)   Resp (!) 24   SpO2 94%   Physical Exam Vitals and nursing note reviewed.  Constitutional:      General: She is in acute distress (on nonrebreather).     Appearance: She is well-developed. She is ill-appearing. She is not toxic-appearing or diaphoretic.  HENT:     Head: Normocephalic and atraumatic.     Right Ear: External ear  normal.     Left Ear: External ear normal.     Nose: Nose normal. No congestion or rhinorrhea.     Mouth/Throat:     Mouth: Mucous membranes are moist.     Pharynx: No oropharyngeal exudate or posterior oropharyngeal erythema.  Eyes:     Conjunctiva/sclera: Conjunctivae normal.     Pupils: Pupils are equal, round, and reactive to light.  Cardiovascular:     Rate and Rhythm: Tachycardia present. Rhythm irregular.     Heart sounds: No murmur heard.   Pulmonary:     Effort: Respiratory distress present.     Breath sounds: No stridor. Rhonchi present. No wheezing or rales.  Chest:     Chest wall: No tenderness.  Abdominal:     General: Abdomen is flat. There is no distension.     Tenderness: There is no abdominal tenderness. There is no right CVA tenderness, left CVA tenderness or rebound.  Musculoskeletal:        General: No tenderness.     Cervical back: Normal range of motion and neck supple. No tenderness.  Skin:    General: Skin is warm.     Coloration: Skin is not pale.     Findings: No erythema or rash.  Neurological:     General: No focal deficit present.     Mental Status: She is alert and oriented to person, place, and time.     Sensory: No sensory deficit.     Motor: No weakness or abnormal muscle tone.     Deep Tendon Reflexes: Reflexes are normal and symmetric.  Psychiatric:        Mood and Affect: Mood normal.     ED Results / Procedures / Treatments   Labs (all labs ordered are listed, but only abnormal results are displayed) Labs Reviewed  CBC WITH DIFFERENTIAL/PLATELET - Abnormal; Notable for the following components:      Result Value   WBC 14.5 (*)    RBC 5.79 (*)    MCV 73.2 (*)    MCH 23.1 (*)    Neutro Abs 12.5 (*)    Lymphs Abs 0.4 (*)    Monocytes Absolute 1.2 (*)    Abs Immature Granulocytes 0.40 (*)    All other components within normal limits  COMPREHENSIVE METABOLIC PANEL - Abnormal; Notable for the following components:   CO2 19 (*)     Glucose, Bld 316 (*)    BUN 60 (*)    Creatinine, Ser 1.69 (*)    Albumin 2.5 (*)    GFR, Estimated 32 (*)    All other components within normal limits  TSH - Abnormal; Notable for the following components:   TSH 0.015 (*)    All other components within normal limits  TROPONIN I (HIGH SENSITIVITY) - Abnormal; Notable for the following components:   Troponin I (High Sensitivity)  45 (*)    All other components within normal limits  TROPONIN I (HIGH SENSITIVITY) - Abnormal; Notable for the following components:   Troponin I (High Sensitivity) 45 (*)    All other components within normal limits  MAGNESIUM    EKG EKG Interpretation  Date/Time:  Tuesday August 31 2020 11:36:04 EDT Ventricular Rate:  141 PR Interval:    QRS Duration: 82 QT Interval:  269 QTC Calculation: 412 R Axis:   34 Text Interpretation: Atrial fibrillation Abnormal R-wave progression, early transition ST depression, probably rate related When comapred to prior, now afib with RVR. No STEMI Confirmed by Antony Blackbird [phone]) on 08/31/2020 11:40:24 AM   Radiology DG Chest Portable 1 View  Result Date: 08/31/2020 CLINICAL DATA:  Dyspnea at home health check up this morning, had COVID-19 diagnosis 10 days prior, recent discharge, new hypoxia EXAM: PORTABLE CHEST 1 VIEW COMPARISON:  Portable exam 1237 hours compared to 08/27/2020 FINDINGS: Normal heart size, mediastinal contours, and pulmonary vascularity. Atherosclerotic calcification aorta. Persistent infiltrates identified in the mid to lower lungs bilaterally consistent with multifocal pneumonia and COVID-19. Appearance unchanged from previous exam. No pleural effusion or pneumothorax. IMPRESSION: Persistent BILATERAL pulmonary infiltrates consistent with multifocal pneumonia and COVID-19. Electronically Signed   By: Lavonia Dana M.D.   On: 08/31/2020 12:45    Procedures Procedures (including critical care time)  CRITICAL CARE Performed by: Gwenyth Allegra  Roda Lauture Total critical care time: 45 minutes Critical care time was exclusive of separately billable procedures and treating other patients. Critical care was necessary to treat or prevent imminent or life-threatening deterioration. Critical care was time spent personally by me on the following activities: development of treatment plan with patient and/or surrogate as well as nursing, discussions with consultants, evaluation of patient's response to treatment, examination of patient, obtaining history from patient or surrogate, ordering and performing treatments and interventions, ordering and review of laboratory studies, ordering and review of radiographic studies, pulse oximetry and re-evaluation of patient's condition.   Medications Ordered in ED Medications  diltiazem (CARDIZEM) 125 mg in dextrose 5% 125 mL (1 mg/mL) infusion (7.5 mg/hr Intravenous Rate/Dose Change 08/31/20 1428)  aspirin EC tablet 81 mg (has no administration in time range)  levofloxacin (LEVAQUIN) tablet 250 mg (has no administration in time range)  amLODipine (NORVASC) tablet 10 mg (has no administration in time range)  rosuvastatin (CRESTOR) tablet 40 mg (has no administration in time range)  zinc sulfate capsule 220 mg (has no administration in time range)  albuterol (VENTOLIN HFA) 108 (90 Base) MCG/ACT inhaler 2 puff (has no administration in time range)  metoprolol tartrate (LOPRESSOR) injection 2.5 mg (has no administration in time range)  metoprolol tartrate (LOPRESSOR) tablet 25 mg (has no administration in time range)  dexamethasone (DECADRON) injection 8 mg (has no administration in time range)  sodium chloride 0.9 % bolus 1,000 mL (has no administration in time range)  apixaban (ELIQUIS) tablet 5 mg (has no administration in time range)  insulin aspart (novoLOG) injection 0-20 Units (has no administration in time range)  lactated ringers infusion (has no administration in time range)  sodium bicarbonate tablet  650 mg (has no administration in time range)  sodium chloride 0.9 % bolus 1,000 mL (0 mLs Intravenous Stopped 08/31/20 1415)     ED Course  I have reviewed the triage vital signs and the nursing notes.  Pertinent labs & imaging results that were available during my care of the patient were reviewed by me and considered in my medical  decision making (see chart for details).    MDM Rules/Calculators/A&P                          Desia Saban is a 69 y.o. 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  According to patient, she has not felt well for the last 2 days.  Home health nurse reportedly went to her house today and found her to be hypoxic with an oxygen saturation of 80% on room air and new A. fib with RVR tachycardia.  Patient denies any history of A. fib.  She reports no chest pain or palpitations but is feeling tired and fatigued.  Is unclear when this started.  She does feel short of breath which is improved now that she is on 6 L nasal cannula.  She reports no nausea, vomiting, constipation, or diarrhea.  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  She denies any extremity pains or other complaints.  On exam, lungs have coarse breath sounds bilaterally.  Chest is nontender.  She has palpable pulses.  She does have an irregular heart rate with a rate between 140 and 160.  EKG does show evidence of A. fib with RVR but no STEMI.  Patient has ostomy with nontender abdomen.  Exam otherwise unremarkable.  Clinically I am concerned that she will need readmission for continued respiratory failure with hypoxia and now A. fib with RVR.  We will give some fluids and will also start her on a diltiazem drip.  We will get a PE study if her creatinine can tolerate it to look for  pulmonary ballismus given the history of DVT and the new hypoxia.  Anticipate readmission after work-up is completed.  2:21 PM X-ray returned showing no pneumothorax. Persistent pulmonary infiltrates consistent with COVID-19 are seen. Initial troponin is elevated, will trend. Other labs show worsening creatinine, fluids were ordered along with the diltiazem to help slow down heart. Heart rate went from the 150 range down to between 101 20 when I was last checking on patient. She still has a leukocytosis but no new anemia.  Due to the new oxygen requirement, she will require admission. She is still waiting on PE study however she is already on blood thinners. We will go ahead and start the admission process given her new oxygen requirement and continued symptoms like related to COVID-19.  3:18 PM The chart reviewed by the admitting team does report that there was no prior DVT but this is of DVT prophylaxis being on the blood thinners.  However, they still want her to get a PE study before admission.  This was ordered and we will follow up on it.  She will be admitted for new A. fib with RVR and continued Covid hypoxia.  Medicine team reports that they will call cardiology after an echo and will help with the rate control drip and fluid balance management.  They will admit for further management.  PE study is still ordered.    Final Clinical Impression(s) / ED Diagnoses Final diagnoses:  Hypoxia  COVID-19  Atrial fibrillation with RVR ([facility])     Clinical Impression: 1. Hypoxia   2. COVID-19   3. Atrial fibrillation with RVR ([facility])     Disposition: Admit  This note was prepared with  assistance of Systems analyst. Occasional wrong-word or sound-a-like substitutions may have occurred due to the inherent limitations of voice recognition software.     Mario Voong, Gwenyth Allegra, MD 08/31/20 [phone]

## 2020-08-31 NOTE — H&P (Signed)
History and Physical   Candace [name] JKD:[id number] DOB: 1951/10/05 DOA: 08/31/2020  PCP: Algis Greenhouse, MD  Patient coming from: From home  I have personally briefly reviewed patient's old medical records in [place].  Chief Concern: elevated HR  HPI: Candace [name] is a 69 y.o. female with medical history significant for recent covid pna, obesity, colon cancer s/p colon resection with colostomy bag, HLD, NIDDM2, CKD3 presented to ED after her follow-up nurse advised her to present to ED for elevated HR.   Review of system was negative for headache, vision changes, chest pain, shortness of breath, abdominal pain, diarrhea, hematuria, dysuria, constipation.  She did endorse generalized weakness.  She endorses compliance with medications when at home.  She denies feeling palpitations.  ED Course: Discussed with ED provider, admit for Afib with RVR in setting of recent covid pna infection. Treated with solumedrol and remdesivir and ceftriaxone on previous admission.    Review of Systems: As per HPI otherwise 10 point review of systems negative.  Past Medical History:  Diagnosis Date  . Diabetes mellitus ([place])   . Hyperlipemia   . Hypertension    No past surgical history on file.  Social History:  reports that she has never smoked. She has never used smokeless tobacco. She reports that she does not drink alcohol and does not use drugs.  No Known Allergies  No family history on file.  Family history: Family history reviewed and not pertinent  Prior to Admission medications   Medication Sig Start Date End Date Taking? Authorizing Provider  acetaminophen (TYLENOL) 500 MG tablet Take 1,000 mg by mouth every 6 (six) hours as needed for mild pain.    [provider]  albuterol (VENTOLIN HFA) 108 (90 Base) MCG/ACT inhaler Inhale 2 puffs into the lungs every 6 (six) hours as needed for wheezing or shortness of breath. 08/29/20   Thurnell Lose, MD  amLODipine (NORVASC)  10 MG tablet Take 1 tablet (10 mg total) by mouth daily. 08/29/20 08/29/21  Thurnell Lose, MD  apixaban (ELIQUIS) 2.5 MG TABS tablet Take 1 tablet (2.5 mg total) by mouth 2 (two) times daily. 08/29/20   Thurnell Lose, MD  aspirin 81 MG EC tablet Take 81 mg by mouth daily. 06/20/18   [provider]  levofloxacin (LEVAQUIN) 250 MG tablet Take 1 tablet (250 mg total) by mouth daily. 08/29/20   Thurnell Lose, MD  metFORMIN (GLUCOPHAGE-XR) 500 MG 24 hr tablet Take 500 mg by mouth daily. 07/16/20   [provider]  rosuvastatin (CRESTOR) 40 MG tablet Take 40 mg by mouth daily. 06/22/20   [provider]  zinc gluconate 50 MG tablet Take 50 mg by mouth daily.    [provider]   Physical Exam: Vitals:   08/31/20 1445 08/31/20 1515 08/31/20 1530 08/31/20 1545  BP: 105/75 97/65 106/74 106/62  Pulse:      Resp: (!) 35 (!) 34 (!) 31 (!) 24  Temp:      TempSrc:      SpO2:       Constitutional: NAD, calm, comfortable Eyes: PERRL, lids and conjunctivae normal ENMT: Mucous membranes are moist. Posterior pharynx clear of any exudate or lesions.Normal dentition.  Neck: normal, supple, no masses, no thyromegaly Respiratory: clear to auscultation bilaterally, no wheezing, no crackles. Normal respiratory effort. No accessory muscle use.  Cardiovascular: Regular rate and rhythm, no murmurs / rubs / gallops. No extremity edema. 2+ pedal pulses. No carotid bruits.  Abdomen: ostomy bag in right/mid abdomen; obese, no tenderness, no masses palpated. No hepatosplenomegaly. Bowel sounds positive.  Musculoskeletal: no clubbing / cyanosis. No joint deformity upper and lower extremities. Good ROM, no contractures. Normal muscle tone.  Skin: no rashes, lesions, ulcers. No induration Neurologic: CN 2-12 grossly intact. Sensation intact. Strength 5/5 in all 4.  Psychiatric: Normal judgment and insight. Alert and oriented x 3. Normal mood.   Labs on Admission: I have  personally reviewed following labs and imaging studies  CBC: Recent Labs  Lab 08/26/20 0145 08/27/20 0038 08/28/20 0241 08/29/20 0323 08/31/20 1315  WBC 10.7* 11.3* 10.8* 12.8* 14.5*  NEUTROABS 9.2* 9.0* 8.6* 10.7* 12.5*  HGB 12.9 12.9 13.0 13.6 13.4  HCT 40.0 40.3 41.2 43.6 42.4  MCV 71.6* 70.7* 72.0* 73.6* 73.2*  PLT 287 335 308 366 182   Basic Metabolic Panel: Recent Labs  Lab 08/26/20 0145 08/27/20 0038 08/28/20 0241 08/29/20 0323 08/31/20 1315  NA 140 146* 143 143 136  K 4.1 4.3 4.4 4.7 4.9  CL 109 112* 109 108 102  CO2 19* 23 23 25  19*  GLUCOSE 295* 154* 216* 269* 316*  BUN 97* 81* 64* 56* 60*  CREATININE 1.99* 1.53* 1.38* 1.45* 1.69*  CALCIUM 10.1 10.7* 10.3 10.4* 10.1  MG 2.1 2.0 1.8 1.7 1.8   GFR: Estimated Creatinine Clearance: 38.8 mL/min (A) (by C-G formula based on SCr of 1.69 mg/dL (H)). Liver Function Tests: Recent Labs  Lab 08/26/20 0145 08/27/20 0038 08/28/20 0241 08/29/20 0323 08/31/20 1315  AST 22 23 26 29 29   ALT 19 18 21 25 21   ALKPHOS 55 53 55 59 57  BILITOT 0.3 0.5 0.3 0.6 1.2  PROT 6.7 6.7 6.4* 6.9 6.5  ALBUMIN 2.7* 2.7* 2.6* 2.7* 2.5*   CBG: Recent Labs  Lab 08/28/20 1734 08/28/20 2101 08/29/20 0720 08/29/20 1145 08/31/20 1704  GLUCAP 288* 281* 217* 249* 244*   Thyroid Function Tests: Recent Labs    08/31/20 1220  TSH 0.015*   Anemia Panel: No results for input(s): VITAMINB12, FOLATE, FERRITIN, TIBC, IRON, RETICCTPCT in the last 72 hours.  Urine analysis:    Component Value Date/Time   COLORURINE YELLOW 08/24/2020 1330   APPEARANCEUR CLOUDY (A) 08/24/2020 1330   LABSPEC 1.012 08/24/2020 1330   PHURINE 5.0 08/24/2020 1330   GLUCOSEU NEGATIVE 08/24/2020 1330   HGBUR LARGE (A) 08/24/2020 1330   BILIRUBINUR NEGATIVE 08/24/2020 1330   KETONESUR NEGATIVE 08/24/2020 1330   PROTEINUR 30 (A) 08/24/2020 1330   NITRITE NEGATIVE 08/24/2020 1330   LEUKOCYTESUR LARGE (A) 08/24/2020 1330   Radiological Exams on Admission:  Personally reviewed and I agree with radiologist reading as below.  CT Angio Chest PE W and/or Wo Contrast  Result Date: 08/31/2020 CLINICAL DATA:  Hypoxia, positive COVID EXAM: CT ANGIOGRAPHY CHEST WITH CONTRAST TECHNIQUE: Multidetector CT imaging of the chest was performed using the standard protocol during bolus administration of intravenous contrast. Multiplanar CT image reconstructions and MIPs were obtained to evaluate the vascular anatomy. CONTRAST:  58mL OMNIPAQUE IOHEXOL 350 MG/ML SOLN COMPARISON:  None. FINDINGS: Cardiovascular: Satisfactory opacification of the pulmonary arteries to the segmental level. No evidence of pulmonary embolism. Normal heart size. No pericardial effusion. Mediastinum/Nodes: No enlarged lymph nodes. Lungs/Pleura: Bilateral areas of consolidation and ground-glass density with a lower lobe predominance. No pleural effusion. No pneumothorax. Upper Abdomen: Partially imaged left adrenal nodule was present on 2015 abdominal CT. Right adrenal nodule was also present in 2015. Musculoskeletal: Degenerative changes of the included spine. Review  of the MIP images confirms the above findings. IMPRESSION: No evidence of acute pulmonary embolism. COVID-19 pneumonia. Electronically Signed   By: Macy Mis M.D.   On: 08/31/2020 16:19   DG Chest Portable 1 View  Result Date: 08/31/2020 CLINICAL DATA:  Dyspnea at home health check up this morning, had COVID-19 diagnosis 10 days prior, recent discharge, new hypoxia EXAM: PORTABLE CHEST 1 VIEW COMPARISON:  Portable exam 1237 hours compared to 08/27/2020 FINDINGS: Normal heart size, mediastinal contours, and pulmonary vascularity. Atherosclerotic calcification aorta. Persistent infiltrates identified in the mid to lower lungs bilaterally consistent with multifocal pneumonia and COVID-19. Appearance unchanged from previous exam. No pleural effusion or pneumothorax. IMPRESSION: Persistent BILATERAL pulmonary infiltrates consistent with  multifocal pneumonia and COVID-19. Electronically Signed   By: Lavonia Dana M.D.   On: 08/31/2020 12:45   EKG: Independently reviewed, showing atrial fibrillation with rate of 141  Assessment/Plan  Principal Problem:   Atrial fibrillation with RVR (HCC) Active Problems:   Pneumonia due to COVID-19 virus   New onset a-fib (HCC)   Leukocytosis   Atrial fibrillation with rapid ventricular response (HCC)   Atrial fibrillation with rapid ventricular response-suspect secondary to sequelae from Covid 19 pneumonia -Admit to observation to progressive -Continue Cardizem drip -Metoprolol tartrate p.o. twice daily -Metoprolol tartrate 2.5 mg IV q5h for HR > 120  -Echo ordered -Currently no urgent indications for cardiology consultation at this time  Elevated troponins-suspect demand ischemia secondary to A. fib, we will order 2 more high-sensitivity troponins -Low clinical suspicion for ACS at this time  Establish pneumonia secondary to COVID-19 -  -Status post 5 days of remdesivir -Status post Solu-Medrol IV and 3 days of ceftriaxone IV -Patient is not vaccinated for COVID-19 -CHeck inflammatory markers including: C-reactive and LDH -We will begin Decadron 8 mg IV every 24 hours, consideration given to completion of 10 doses -Restart ceftriaxone 1 g IV and azithromycin 500 mg IV -Continue levofloxacin to 50 mg daily  AKI on CKD 3-multifactorial including poor p.o. intake versus secondary to Covid inflammatory process -Status post 2 L normal saline bolus in the ED -LR at 50 cc/h for 1 day given patient's AKI however consideration given to conservative fluid administration due to Covid infection  Metabolic acidosis-mild, suspect secondary to acute kidney injury -Bicarb tablets twice daily -BMP in the a.m.  Leukocytosis-worsening when compared to previous labs during previous admissions -Suspect secondary to Covid infection, management as above  Elevated D-dimer-CTA was negative for  acute PE, bilateral areas of consolidation and groundglass density with lower lobe predominance.  No pleural effusion no pneumothorax.  H/o colon cancer s/p colon resection with ostomy bag - dark green stool in bag, negative for visual evidence of blood   Hyperglycemia in setting of non-insulin-dependent diabetes mellitus -Worsening due to recent steroid use for Covid infection -Due to AKI, we will hold Metformin at this time -A1c was checked on 08/24/2020 showing 7% -Insulin SSI -Carb modified diet  Hyperlipidemia-resume Crestor 40 mg daily Hypertension-controlled resumed amlodipine 10 mg daily  DVT prophylaxis: Eliquis Code Status: Full code Diet: Heart healthy/carb modified Family Communication: None at this time Disposition Plan: Pending clinical course Consults called: None at this time Admission status: Observation in progressive unit  Jessy Cybulski N Jerolyn Flenniken D.O. [facility]  If 7AM-7PM, please contact day-coverage provider [email or web address]  08/31/2020, 5:16 PM

## 2020-08-31 NOTE — ED Notes (Signed)
Pt transport to Ct scan

## 2020-08-31 NOTE — ED Notes (Signed)
Pt resting in stretcher, pt lethargic but aao4, gcs20, vss on cardiac monitor, afib at this time. Cardizem infusing w/o difficulty, HR between 90-110. Breathing nonlabored with symmetrical chest rise symmetrical bilaterally, pt on 5 liters [place]. Side rails up, call bell in reach.

## 2020-08-31 NOTE — ED Notes (Signed)
Patient transported to CT 

## 2020-08-31 NOTE — ED Triage Notes (Signed)
Pt presents via EMS w/ c/o dyspnea on a home health checkup this AM. Pt with COVID19 diagnosis 10 days prior discovered to be 80% on RA by home health provided. En route, pt discovered to be in [place] w/ no prior history. Given 20mg  Cardizem in route w/ no resolution.   Pt on Eliquis for prior DVT.

## 2020-08-31 NOTE — Progress Notes (Signed)
RT called to assess patient due to drop in SAT. When I arrived, patient was on NRB. SAT reading 92% without a good pleth. Moved SAT probe to ear and was 98%. Weaned to 6L [place] and sat now 92-93%. No increased WOB and patient stated she is feeling better after reposition. Told RN we could try salter HF if she decompensated again. RT to monitor.

## 2020-09-01 ENCOUNTER — Inpatient Hospital Stay (HOSPITAL_COMMUNITY): Payer: Medicare Other

## 2020-09-01 ENCOUNTER — Inpatient Hospital Stay (HOSPITAL_COMMUNITY): Payer: Medicare Other | Admitting: Anesthesiology

## 2020-09-01 ENCOUNTER — Encounter (HOSPITAL_COMMUNITY)
Admission: EM | Disposition: A | Discharge status: Home Health | Payer: Self-pay | Source: Home / Self Care | Attending: Emergency Medicine

## 2020-09-01 ENCOUNTER — Observation Stay (HOSPITAL_BASED_OUTPATIENT_CLINIC_OR_DEPARTMENT_OTHER): Payer: Medicare Other

## 2020-09-01 DIAGNOSIS — K7200 Acute and subacute hepatic failure without coma: Secondary | ICD-10-CM | POA: Diagnosis not present

## 2020-09-01 DIAGNOSIS — J9621 Acute and chronic respiratory failure with hypoxia: Secondary | ICD-10-CM | POA: Diagnosis not present

## 2020-09-01 DIAGNOSIS — J1282 Pneumonia due to coronavirus disease 2019: Secondary | ICD-10-CM

## 2020-09-01 DIAGNOSIS — U071 COVID-19: Principal | ICD-10-CM

## 2020-09-01 DIAGNOSIS — Z930 Tracheostomy status: Secondary | ICD-10-CM | POA: Diagnosis not present

## 2020-09-01 DIAGNOSIS — J189 Pneumonia, unspecified organism: Secondary | ICD-10-CM | POA: Diagnosis not present

## 2020-09-01 DIAGNOSIS — Z515 Encounter for palliative care: Secondary | ICD-10-CM | POA: Diagnosis not present

## 2020-09-01 DIAGNOSIS — J9620 Acute and chronic respiratory failure, unspecified whether with hypoxia or hypercapnia: Secondary | ICD-10-CM | POA: Diagnosis not present

## 2020-09-01 DIAGNOSIS — E43 Unspecified severe protein-calorie malnutrition: Secondary | ICD-10-CM | POA: Diagnosis present

## 2020-09-01 DIAGNOSIS — R7989 Other specified abnormal findings of blood chemistry: Secondary | ICD-10-CM | POA: Diagnosis not present

## 2020-09-01 DIAGNOSIS — K862 Cyst of pancreas: Secondary | ICD-10-CM | POA: Diagnosis present

## 2020-09-01 DIAGNOSIS — N179 Acute kidney failure, unspecified: Secondary | ICD-10-CM | POA: Diagnosis not present

## 2020-09-01 DIAGNOSIS — K265 Chronic or unspecified duodenal ulcer with perforation: Secondary | ICD-10-CM | POA: Diagnosis not present

## 2020-09-01 DIAGNOSIS — R0902 Hypoxemia: Secondary | ICD-10-CM

## 2020-09-01 DIAGNOSIS — I959 Hypotension, unspecified: Secondary | ICD-10-CM | POA: Diagnosis not present

## 2020-09-01 DIAGNOSIS — N170 Acute kidney failure with tubular necrosis: Secondary | ICD-10-CM | POA: Diagnosis present

## 2020-09-01 DIAGNOSIS — I4891 Unspecified atrial fibrillation: Secondary | ICD-10-CM

## 2020-09-01 DIAGNOSIS — E872 Acidosis: Secondary | ICD-10-CM | POA: Diagnosis present

## 2020-09-01 DIAGNOSIS — G928 Other toxic encephalopathy: Secondary | ICD-10-CM | POA: Diagnosis not present

## 2020-09-01 DIAGNOSIS — K266 Chronic or unspecified duodenal ulcer with both hemorrhage and perforation: Secondary | ICD-10-CM | POA: Diagnosis present

## 2020-09-01 DIAGNOSIS — J9600 Acute respiratory failure, unspecified whether with hypoxia or hypercapnia: Secondary | ICD-10-CM | POA: Diagnosis not present

## 2020-09-01 DIAGNOSIS — D62 Acute posthemorrhagic anemia: Secondary | ICD-10-CM | POA: Diagnosis not present

## 2020-09-01 DIAGNOSIS — J80 Acute respiratory distress syndrome: Secondary | ICD-10-CM | POA: Diagnosis present

## 2020-09-01 DIAGNOSIS — I469 Cardiac arrest, cause unspecified: Secondary | ICD-10-CM | POA: Diagnosis not present

## 2020-09-01 DIAGNOSIS — K659 Peritonitis, unspecified: Secondary | ICD-10-CM | POA: Diagnosis not present

## 2020-09-01 DIAGNOSIS — R000 Tachycardia, unspecified: Secondary | ICD-10-CM | POA: Diagnosis not present

## 2020-09-01 DIAGNOSIS — A419 Sepsis, unspecified organism: Secondary | ICD-10-CM | POA: Diagnosis not present

## 2020-09-01 DIAGNOSIS — E870 Hyperosmolality and hypernatremia: Secondary | ICD-10-CM | POA: Diagnosis not present

## 2020-09-01 DIAGNOSIS — Y95 Nosocomial condition: Secondary | ICD-10-CM | POA: Diagnosis not present

## 2020-09-01 DIAGNOSIS — J9601 Acute respiratory failure with hypoxia: Secondary | ICD-10-CM | POA: Diagnosis not present

## 2020-09-01 DIAGNOSIS — L89154 Pressure ulcer of sacral region, stage 4: Secondary | ICD-10-CM | POA: Diagnosis not present

## 2020-09-01 DIAGNOSIS — Y92009 Unspecified place in unspecified non-institutional (private) residence as the place of occurrence of the external cause: Secondary | ICD-10-CM | POA: Diagnosis not present

## 2020-09-01 DIAGNOSIS — I480 Paroxysmal atrial fibrillation: Secondary | ICD-10-CM | POA: Diagnosis not present

## 2020-09-01 DIAGNOSIS — R6521 Severe sepsis with septic shock: Secondary | ICD-10-CM | POA: Diagnosis not present

## 2020-09-01 DIAGNOSIS — A4189 Other specified sepsis: Secondary | ICD-10-CM | POA: Diagnosis not present

## 2020-09-01 DIAGNOSIS — R609 Edema, unspecified: Secondary | ICD-10-CM | POA: Diagnosis not present

## 2020-09-01 DIAGNOSIS — Z9911 Dependence on respirator [ventilator] status: Secondary | ICD-10-CM | POA: Diagnosis not present

## 2020-09-01 DIAGNOSIS — N186 End stage renal disease: Secondary | ICD-10-CM | POA: Diagnosis not present

## 2020-09-01 DIAGNOSIS — R579 Shock, unspecified: Secondary | ICD-10-CM | POA: Diagnosis not present

## 2020-09-01 DIAGNOSIS — I82443 Acute embolism and thrombosis of tibial vein, bilateral: Secondary | ICD-10-CM | POA: Diagnosis not present

## 2020-09-01 DIAGNOSIS — Y848 Other medical procedures as the cause of abnormal reaction of the patient, or of later complication, without mention of misadventure at the time of the procedure: Secondary | ICD-10-CM | POA: Diagnosis not present

## 2020-09-01 DIAGNOSIS — J15211 Pneumonia due to Methicillin susceptible Staphylococcus aureus: Secondary | ICD-10-CM | POA: Diagnosis not present

## 2020-09-01 DIAGNOSIS — A4102 Sepsis due to Methicillin resistant Staphylococcus aureus: Secondary | ICD-10-CM | POA: Diagnosis not present

## 2020-09-01 DIAGNOSIS — J9602 Acute respiratory failure with hypercapnia: Secondary | ICD-10-CM | POA: Diagnosis not present

## 2020-09-01 DIAGNOSIS — J15212 Pneumonia due to Methicillin resistant Staphylococcus aureus: Secondary | ICD-10-CM | POA: Diagnosis not present

## 2020-09-01 DIAGNOSIS — R578 Other shock: Secondary | ICD-10-CM | POA: Diagnosis not present

## 2020-09-01 HISTORY — PX: LAPAROTOMY: SHX154

## 2020-09-01 HISTORY — PX: LYSIS OF ADHESION: SHX5961

## 2020-09-01 LAB — BASIC METABOLIC PANEL
Anion gap: 13 (ref 5–15)
BUN: 45 mg/dL — ABNORMAL HIGH (ref 8–23)
CO2: 19 mmol/L — ABNORMAL LOW (ref 22–32)
Calcium: 9.4 mg/dL (ref 8.9–10.3)
Chloride: 110 mmol/L (ref 98–111)
Creatinine, Ser: 1.51 mg/dL — ABNORMAL HIGH (ref 0.44–1.00)
GFR, Estimated: 37 mL/min — ABNORMAL LOW (ref >60)
Glucose, Bld: 312 mg/dL — ABNORMAL HIGH (ref 70–99)
Potassium: 5.1 mmol/L (ref 3.5–5.1)
Sodium: 142 mmol/L (ref 135–145)

## 2020-09-01 LAB — D-DIMER, QUANTITATIVE: D-Dimer, Quant: 2.53 ug/mL-FEU — ABNORMAL HIGH (ref 0.00–0.50)

## 2020-09-01 LAB — POCT I-STAT 7, (LYTES, BLD GAS, ICA,H+H)
Acid-base deficit: 3 mmol/L — ABNORMAL HIGH (ref 0.0–2.0)
Acid-base deficit: 6 mmol/L — ABNORMAL HIGH (ref 0.0–2.0)
Bicarbonate: 22.2 mmol/L (ref 20.0–28.0)
Bicarbonate: 19.1 mmol/L — ABNORMAL LOW (ref 20.0–28.0)
Calcium, Ion: 1.43 mmol/L — ABNORMAL HIGH (ref 1.15–1.40)
Calcium, Ion: 1.32 mmol/L (ref 1.15–1.40)
HCT: 37 % (ref 36.0–46.0)
HCT: 31 % — ABNORMAL LOW (ref 36.0–46.0)
Hemoglobin: 12.6 g/dL (ref 12.0–15.0)
Hemoglobin: 10.5 g/dL — ABNORMAL LOW (ref 12.0–15.0)
O2 Saturation: 98 %
O2 Saturation: 85 %
Patient temperature: 35.5
Potassium: 3.7 mmol/L (ref 3.5–5.1)
Potassium: 3.7 mmol/L (ref 3.5–5.1)
Sodium: 140 mmol/L (ref 135–145)
Sodium: 143 mmol/L (ref 135–145)
TCO2: 23 mmol/L (ref 22–32)
TCO2: 20 mmol/L — ABNORMAL LOW (ref 22–32)
pCO2 arterial: 35.1 mmHg (ref 32.0–48.0)
pCO2 arterial: 34.7 mmHg (ref 32.0–48.0)
pH, Arterial: 7.402 (ref 7.350–7.450)
pH, Arterial: 7.348 — ABNORMAL LOW (ref 7.350–7.450)
pO2, Arterial: 103 mmHg (ref 83.0–108.0)
pO2, Arterial: 53 mmHg — ABNORMAL LOW (ref 83.0–108.0)

## 2020-09-01 LAB — ECHOCARDIOGRAM COMPLETE
Area-P 1/2: 2.95 cm2
Height: 68 in
S' Lateral: 2.3 cm
Weight: 3516.78 [oz_av]

## 2020-09-01 LAB — GLUCOSE, CAPILLARY
Glucose-Capillary: 300 mg/dL — ABNORMAL HIGH (ref 70–99)
Glucose-Capillary: 340 mg/dL — ABNORMAL HIGH (ref 70–99)
Glucose-Capillary: 288 mg/dL — ABNORMAL HIGH (ref 70–99)
Glucose-Capillary: 234 mg/dL — ABNORMAL HIGH (ref 70–99)
Glucose-Capillary: 200 mg/dL — ABNORMAL HIGH (ref 70–99)

## 2020-09-01 LAB — CBC
HCT: 39.5 % (ref 36.0–46.0)
Hemoglobin: 12.1 g/dL (ref 12.0–15.0)
MCH: 23.3 pg — ABNORMAL LOW (ref 26.0–34.0)
MCHC: 30.6 g/dL (ref 30.0–36.0)
MCV: 76.1 fL — ABNORMAL LOW (ref 80.0–100.0)
Platelets: 324 10*3/uL (ref 150–400)
RBC: 5.19 MIL/uL — ABNORMAL HIGH (ref 3.87–5.11)
RDW: 15.3 % (ref 11.5–15.5)
WBC: 11.6 10*3/uL — ABNORMAL HIGH (ref 4.0–10.5)
nRBC: 0 % (ref 0.0–0.2)

## 2020-09-01 LAB — PROCALCITONIN: Procalcitonin: <0.1 ng/mL

## 2020-09-01 LAB — BPAM FFP: Unit Type and Rh: 6200

## 2020-09-01 LAB — LIPASE, BLOOD: Lipase: 48 U/L (ref 11–51)

## 2020-09-01 LAB — PREPARE RBC (CROSSMATCH)

## 2020-09-01 LAB — ABO/RH: ABO/RH(D): B POS

## 2020-09-01 SURGERY — LAPAROTOMY, EXPLORATORY
Anesthesia: General | Site: Abdomen

## 2020-09-01 MED ORDER — AMLODIPINE BESYLATE 10 MG PO TABS: 10.0000 mg | ORAL_TABLET | Freq: Every day | ORAL | Status: DC

## 2020-09-01 MED ORDER — DEXAMETHASONE SODIUM PHOSPHATE 10 MG/ML IJ SOLN
INTRAMUSCULAR | Status: DC | PRN
  Administered 2020-09-01: 4 mg via INTRAVENOUS

## 2020-09-01 MED ORDER — MIDAZOLAM HCL 2 MG/2ML IJ SOLN
INTRAMUSCULAR | Status: AC
  Filled 2020-09-01: qty 2

## 2020-09-01 MED ORDER — ALUM & MAG HYDROXIDE-SIMETH 200-200-20 MG/5ML PO SUSP
30.0000 mL | Freq: Four times a day (QID) | ORAL | Status: DC | PRN
  Administered 2020-09-01: 30 mL via ORAL
  Filled 2020-09-01: qty 30

## 2020-09-01 MED ORDER — CHLORHEXIDINE GLUCONATE CLOTH 2 % EX PADS
6.0000 | MEDICATED_PAD | Freq: Every day | CUTANEOUS | Status: DC
  Administered 2020-09-01 – 2020-10-15 (×42): 6 via TOPICAL

## 2020-09-01 MED ORDER — EPHEDRINE SULFATE-NACL 50-0.9 MG/10ML-% IV SOSY
PREFILLED_SYRINGE | INTRAVENOUS | Status: DC | PRN
  Administered 2020-09-01 (×2): 10 mg via INTRAVENOUS

## 2020-09-01 MED ORDER — PANTOPRAZOLE SODIUM 40 MG IV SOLR
40.0000 mg | Freq: Two times a day (BID) | INTRAVENOUS | Status: DC
  Administered 2020-09-01 – 2020-09-09 (×16): 40 mg via INTRAVENOUS
  Filled 2020-09-01 (×18): qty 40

## 2020-09-01 MED ORDER — METOPROLOL TARTRATE 25 MG PO TABS
25.0000 mg | ORAL_TABLET | Freq: Two times a day (BID) | ORAL | Status: DC
  Administered 2020-09-03: 25 mg
  Filled 2020-09-01 (×3): qty 1

## 2020-09-01 MED ORDER — LACTATED RINGERS IV SOLN: INTRAVENOUS | Status: DC | PRN

## 2020-09-01 MED ORDER — PROPOFOL 10 MG/ML IV BOLUS
INTRAVENOUS | Status: DC | PRN
  Administered 2020-09-01: 110 mg via INTRAVENOUS

## 2020-09-01 MED ORDER — HYDROMORPHONE HCL 1 MG/ML IJ SOLN
INTRAMUSCULAR | Status: AC
  Administered 2020-09-01: 0.25 mg via INTRAVENOUS
  Filled 2020-09-01: qty 2

## 2020-09-01 MED ORDER — SODIUM CHLORIDE 0.9 % IV SOLN: INTRAVENOUS | Status: DC | PRN

## 2020-09-01 MED ORDER — MIDAZOLAM HCL 2 MG/2ML IJ SOLN: 0.5000 mg | Freq: Once | INTRAMUSCULAR | Status: DC | PRN

## 2020-09-01 MED ORDER — FENTANYL CITRATE (PF) 250 MCG/5ML IJ SOLN
INTRAMUSCULAR | Status: AC
  Filled 2020-09-01: qty 5

## 2020-09-01 MED ORDER — EMPTY CONTAINERS FLEXIBLE MISC: 400.0000 mg | Freq: Once | Status: DC

## 2020-09-01 MED ORDER — SODIUM BICARBONATE 650 MG PO TABS: 650.0000 mg | ORAL_TABLET | Freq: Two times a day (BID) | ORAL | Status: DC

## 2020-09-01 MED ORDER — OXYCODONE HCL 5 MG/5ML PO SOLN: 5.0000 mg | Freq: Once | ORAL | Status: DC | PRN

## 2020-09-01 MED ORDER — INSULIN ASPART 100 UNIT/ML ~~LOC~~ SOLN: 4.0000 [IU] | Freq: Three times a day (TID) | SUBCUTANEOUS | Status: DC

## 2020-09-01 MED ORDER — PROMETHAZINE HCL 25 MG/ML IJ SOLN: 6.2500 mg | INTRAMUSCULAR | Status: DC | PRN

## 2020-09-01 MED ORDER — PIPERACILLIN-TAZOBACTAM 3.375 G IVPB
3.3750 g | Freq: Three times a day (TID) | INTRAVENOUS | Status: AC
  Administered 2020-09-01 – 2020-09-05 (×13): 3.375 g via INTRAVENOUS
  Filled 2020-09-01 (×16): qty 50

## 2020-09-01 MED ORDER — LACTATED RINGERS IV SOLN: INTRAVENOUS | Status: DC

## 2020-09-01 MED ORDER — OXYCODONE HCL 5 MG PO TABS: 5.0000 mg | ORAL_TABLET | Freq: Once | ORAL | Status: DC | PRN

## 2020-09-01 MED ORDER — MORPHINE SULFATE (PF) 2 MG/ML IV SOLN
1.0000 mg | INTRAVENOUS | Status: DC | PRN
  Administered 2020-09-01 – 2020-09-07 (×9): 1 mg via INTRAVENOUS
  Filled 2020-09-01 (×9): qty 1

## 2020-09-01 MED ORDER — SUCCINYLCHOLINE CHLORIDE 200 MG/10ML IV SOSY
PREFILLED_SYRINGE | INTRAVENOUS | Status: DC | PRN
  Administered 2020-09-01: 140 mg via INTRAVENOUS

## 2020-09-01 MED ORDER — FENTANYL CITRATE (PF) 250 MCG/5ML IJ SOLN
INTRAMUSCULAR | Status: DC | PRN
  Administered 2020-09-01: 50 ug via INTRAVENOUS

## 2020-09-01 MED ORDER — 0.9 % SODIUM CHLORIDE (POUR BTL) OPTIME
TOPICAL | Status: DC | PRN
  Administered 2020-09-01: 1000 mL
  Administered 2020-09-01: 3000 mL

## 2020-09-01 MED ORDER — PHENYLEPHRINE HCL-NACL 10-0.9 MG/250ML-% IV SOLN
INTRAVENOUS | Status: DC | PRN
  Administered 2020-09-01: 40 ug/min via INTRAVENOUS
  Administered 2020-09-01: 20 ug/min via INTRAVENOUS

## 2020-09-01 MED ORDER — ROCURONIUM BROMIDE 10 MG/ML (PF) SYRINGE
PREFILLED_SYRINGE | INTRAVENOUS | Status: DC | PRN
  Administered 2020-09-01: 50 mg via INTRAVENOUS

## 2020-09-01 MED ORDER — INSULIN DETEMIR 100 UNIT/ML ~~LOC~~ SOLN
10.0000 [IU] | Freq: Two times a day (BID) | SUBCUTANEOUS | Status: DC
  Administered 2020-09-01 – 2020-09-02 (×3): 10 [IU] via SUBCUTANEOUS
  Filled 2020-09-01 (×5): qty 0.1

## 2020-09-01 MED ORDER — HYDROMORPHONE HCL 1 MG/ML IJ SOLN: 0.2500 mg | INTRAMUSCULAR | Status: DC | PRN

## 2020-09-01 MED ORDER — SODIUM CHLORIDE 0.9 % IV SOLN
1.0000 g | INTRAVENOUS | Status: DC
  Filled 2020-09-01: qty 10

## 2020-09-01 MED ORDER — SODIUM CHLORIDE 0.9% IV SOLUTION: Freq: Once | INTRAVENOUS | Status: DC

## 2020-09-01 MED ORDER — FLUCONAZOLE IN SODIUM CHLORIDE 400-0.9 MG/200ML-% IV SOLN
400.0000 mg | Freq: Once | INTRAVENOUS | Status: AC
  Administered 2020-09-01: 400 mg via INTRAVENOUS
  Filled 2020-09-01 (×2): qty 200

## 2020-09-01 MED ORDER — METHYLPREDNISOLONE SODIUM SUCC 125 MG IJ SOLR
50.0000 mg | Freq: Two times a day (BID) | INTRAMUSCULAR | Status: DC
  Administered 2020-09-01 – 2020-09-02 (×3): 50 mg via INTRAVENOUS
  Filled 2020-09-01 (×3): qty 2

## 2020-09-01 MED ORDER — ALBUMIN HUMAN 5 % IV SOLN: INTRAVENOUS | Status: DC | PRN

## 2020-09-01 MED ORDER — ZINC SULFATE 220 (50 ZN) MG PO CAPS: 220.0000 mg | ORAL_CAPSULE | Freq: Every day | ORAL | Status: DC

## 2020-09-01 MED ORDER — MIDAZOLAM HCL 5 MG/5ML IJ SOLN
INTRAMUSCULAR | Status: DC | PRN
  Administered 2020-09-01: 2 mg via INTRAVENOUS

## 2020-09-01 MED ORDER — PHENYLEPHRINE 40 MCG/ML (10ML) SYRINGE FOR IV PUSH (FOR BLOOD PRESSURE SUPPORT)
PREFILLED_SYRINGE | INTRAVENOUS | Status: DC | PRN
  Administered 2020-09-01 (×2): 120 ug via INTRAVENOUS
  Administered 2020-09-01: 40 ug via INTRAVENOUS
  Administered 2020-09-01 (×3): 120 ug via INTRAVENOUS

## 2020-09-01 MED ORDER — PROMETHAZINE HCL 25 MG/ML IJ SOLN
INTRAMUSCULAR | Status: AC
  Filled 2020-09-01: qty 1

## 2020-09-01 MED ORDER — MEPERIDINE HCL 25 MG/ML IJ SOLN: 6.2500 mg | INTRAMUSCULAR | Status: DC | PRN

## 2020-09-01 MED ORDER — PANTOPRAZOLE SODIUM 40 MG IV SOLR
40.0000 mg | INTRAVENOUS | Status: DC
  Administered 2020-09-01: 40 mg via INTRAVENOUS
  Filled 2020-09-01: qty 40

## 2020-09-01 MED ORDER — ONDANSETRON HCL 4 MG/2ML IJ SOLN
INTRAMUSCULAR | Status: DC | PRN
  Administered 2020-09-01: 4 mg via INTRAVENOUS

## 2020-09-01 MED ORDER — ALUM & MAG HYDROXIDE-SIMETH 200-200-20 MG/5ML PO SUSP
30.0000 mL | Freq: Four times a day (QID) | ORAL | Status: DC | PRN
  Administered 2020-09-03 (×2): 30 mL
  Filled 2020-09-01 (×2): qty 30

## 2020-09-01 MED ORDER — SUGAMMADEX SODIUM 200 MG/2ML IV SOLN
INTRAVENOUS | Status: DC | PRN
  Administered 2020-09-01 (×2): 200 mg via INTRAVENOUS

## 2020-09-01 MED ORDER — PROTHROMBIN COMPLEX CONC HUMAN 500 UNITS IV KIT
2603.0000 [IU] | PACK | Status: AC
  Administered 2020-09-01: 2603 [IU] via INTRAVENOUS
  Filled 2020-09-01: qty 2000

## 2020-09-01 MED ORDER — ROSUVASTATIN CALCIUM 20 MG PO TABS: 40.0000 mg | ORAL_TABLET | Freq: Every day | ORAL | Status: DC

## 2020-09-01 SURGICAL SUPPLY — 47 items
BIOPATCH RED 1 DISK 7.0 (GAUZE/BANDAGES/DRESSINGS) ×2 IMPLANT
BLADE CLIPPER SURG (BLADE) ×2 IMPLANT
BNDG GAUZE ELAST 4 BULKY (GAUZE/BANDAGES/DRESSINGS) ×2 IMPLANT
CANISTER SUCT 3000ML PPV (MISCELLANEOUS) ×2 IMPLANT
CHLORAPREP W/TINT 26 (MISCELLANEOUS) ×2 IMPLANT
COVER SURGICAL LIGHT HANDLE (MISCELLANEOUS) ×2 IMPLANT
DRAIN CHANNEL 19F RND (DRAIN) ×2 IMPLANT
DRAPE LAPAROSCOPIC ABDOMINAL (DRAPES) IMPLANT
DRAPE UNIVERSAL (DRAPES) ×2 IMPLANT
DRAPE WARM FLUID 44X44 (DRAPES) ×2 IMPLANT
DRSG OPSITE POSTOP 4X10 (GAUZE/BANDAGES/DRESSINGS) IMPLANT
DRSG OPSITE POSTOP 4X8 (GAUZE/BANDAGES/DRESSINGS) IMPLANT
DRSG PAD ABDOMINAL 8X10 ST (GAUZE/BANDAGES/DRESSINGS) ×4 IMPLANT
DRSG TEGADERM 2-3/8X2-3/4 SM (GAUZE/BANDAGES/DRESSINGS) ×2 IMPLANT
ELECT BLADE 6.5 EXT (BLADE) IMPLANT
ELECT CAUTERY BLADE 6.4 (BLADE) ×2 IMPLANT
ELECT REM PT RETURN 9FT ADLT (ELECTROSURGICAL) ×2
ELECTRODE REM PT RTRN 9FT ADLT (ELECTROSURGICAL) ×1 IMPLANT
EVACUATOR SILICONE 100CC (DRAIN) ×2 IMPLANT
GLOVE BIO SURGEON STRL SZ 6.5 (GLOVE) ×2 IMPLANT
GLOVE BIOGEL PI IND STRL 6 (GLOVE) ×1 IMPLANT
GLOVE BIOGEL PI INDICATOR 6 (GLOVE) ×1
GOWN STRL REUS W/ TWL LRG LVL3 (GOWN DISPOSABLE) ×2 IMPLANT
GOWN STRL REUS W/TWL LRG LVL3 (GOWN DISPOSABLE) ×4
HANDLE SUCTION POOLE (INSTRUMENTS) ×2 IMPLANT
KIT BASIN OR (CUSTOM PROCEDURE TRAY) ×2 IMPLANT
KIT OSTOMY DRAINABLE 2.75 STR (WOUND CARE) ×2 IMPLANT
KIT TURNOVER KIT B (KITS) ×2 IMPLANT
LIGASURE IMPACT 36 18CM CVD LR (INSTRUMENTS) IMPLANT
NS IRRIG 1000ML POUR BTL (IV SOLUTION) ×4 IMPLANT
PACK GENERAL/GYN (CUSTOM PROCEDURE TRAY) ×2 IMPLANT
PAD ARMBOARD 7.5X6 YLW CONV (MISCELLANEOUS) ×2 IMPLANT
PENCIL SMOKE EVACUATOR (MISCELLANEOUS) ×2 IMPLANT
SPONGE LAP 18X18 RF (DISPOSABLE) ×6 IMPLANT
STAPLER VISISTAT 35W (STAPLE) ×2 IMPLANT
SUCTION POOLE HANDLE (INSTRUMENTS) ×4
SUT ETHILON 2 0 FS 18 (SUTURE) ×2 IMPLANT
SUT PDS AB 1 TP1 54 (SUTURE) IMPLANT
SUT PDS AB 1 TP1 96 (SUTURE) ×4 IMPLANT
SUT SILK 2 0 SH CR/8 (SUTURE) ×2 IMPLANT
SUT SILK 2 0 TIES 10X30 (SUTURE) ×2 IMPLANT
SUT SILK 3 0 SH CR/8 (SUTURE) ×2 IMPLANT
SUT SILK 3 0 TIES 10X30 (SUTURE) ×2 IMPLANT
SUT VIC AB 3-0 SH 18 (SUTURE) IMPLANT
TOWEL GREEN STERILE (TOWEL DISPOSABLE) ×2 IMPLANT
TRAY FOLEY MTR SLVR 16FR STAT (SET/KITS/TRAYS/PACK) ×2 IMPLANT
YANKAUER SUCT BULB TIP NO VENT (SUCTIONS) ×2 IMPLANT

## 2020-09-01 NOTE — Progress Notes (Signed)
PACU RN Arrived for recovery in the OR at 1821

## 2020-09-01 NOTE — Anesthesia Procedure Notes (Signed)
Procedure Name: Intubation Date/Time: 09/01/2020 4:35 PM Performed by: Milford Cage, CRNA Pre-anesthesia Checklist: Patient identified, Emergency Drugs available, Suction available and Patient being monitored Patient Re-evaluated:Patient Re-evaluated prior to induction Oxygen Delivery Method: Circle System Utilized Preoxygenation: Pre-oxygenation with 100% oxygen Induction Type: IV induction Ventilation: Mask ventilation without difficulty Laryngoscope Size: Glidescope and 4 Grade View: Grade I Tube type: Oral Tube size: 7.5 mm Number of attempts: 1 Airway Equipment and Method: Stylet Placement Confirmation: ETT inserted through vocal cords under direct vision,  positive ETCO2 and breath sounds checked- equal and bilateral Secured at: 22 cm Tube secured with: Tape Dental Injury: Teeth and Oropharynx as per pre-operative assessment

## 2020-09-01 NOTE — Transfer of Care (Signed)
Immediate Anesthesia Transfer of Care Note  Patient: Candace [name]  Procedure(s) Performed: EXPLORATORY LAPAROTOMY; Repair of Perforated Duodenal Ulcer (N/A Abdomen) LYSIS OF ADHESION (N/A Abdomen)  Patient Location: PACU  Anesthesia Type:General  Level of Consciousness: awake  Airway & Oxygen Therapy: Patient Spontanous Breathing and Patient connected to face mask oxygen  Post-op Assessment: Report given to RN and Post -op Vital signs reviewed and stable  Post vital signs: Reviewed and stable  Last Vitals:  Vitals Value Taken Time  BP 117/50 09/01/20 1826  Temp    Pulse    Resp    SpO2    Vitals shown include unvalidated device data.  Last Pain:  Vitals:   09/01/20 1551  TempSrc: Oral  PainSc:          Complications: No complications documented.

## 2020-09-01 NOTE — TOC Initial Note (Signed)
Transition of Care [facility]) - Initial/Assessment Note    Patient Details  Name: Candace [name] MRN: [id number] Date of Birth: 01/31/1951  [facility] CM/SW Contact:    Carles Collet, RN Phone Number: 09/01/2020, 12:09 PM  Clinical Narrative:                Patient discharged within last 5 days.  Declined to review MOON letter at this time, stating she is not feeling well. Set up at DC on 10/31 with [facility] services RN PT through [place]. Contacted liaison to notify of observation status, will need resumption orders if changed to inpatient prior to DC. Set up with home oxygen through [place] on 10/31, and DME RW WC 3/1 on 10/31.  CM will continue to follow.     Expected Discharge Plan: [place] Barriers to Discharge: Continued Medical Work up   Patient Goals and CMS Choice        Expected Discharge Plan and Services Expected Discharge Plan: [place]   Discharge Planning Services: CM Consult                                 [facility] Agency: [place]        Prior Living Arrangements/Services                       Activities of Daily Living Home Assistive Devices/Equipment: None ADL Screening (condition at time of admission) Patient's cognitive ability adequate to safely complete daily activities?: Yes Is the patient deaf or have difficulty hearing?: No Does the patient have difficulty seeing, even when wearing glasses/contacts?: No Does the patient have difficulty concentrating, remembering, or making decisions?: No Patient able to express need for assistance with ADLs?: No Does the patient have difficulty dressing or bathing?: No Independently performs ADLs?: Yes (appropriate for developmental age) Does the patient have difficulty walking or climbing stairs?: No Weakness of Legs: None Weakness of Arms/Hands: None  Permission Sought/Granted                  Emotional Assessment               Admission diagnosis:  Hypoxia [R09.02] Atrial fibrillation with rapid ventricular response (HCC) [I48.91] Atrial fibrillation with RVR ([place]) [I48.91] COVID-19 [U07.1] Patient Active Problem List   Diagnosis Date Noted  . Pneumonia due to COVID-19 virus 08/31/2020  . New onset a-fib ([place]) 08/31/2020  . Atrial fibrillation with RVR ([place]) 08/31/2020  . Leukocytosis 08/31/2020  . Atrial fibrillation with rapid ventricular response ([place]) 08/31/2020  . AKI (acute kidney injury) ([place]) 08/23/2020   PCP:  Algis Greenhouse, MD Pharmacy:   [facility] [address], [place] [address] [place] [address] Phone: [phone] Fax: [phone]     Social Determinants of Health (SDOH) Interventions    Readmission Risk Interventions No flowsheet data found.

## 2020-09-01 NOTE — Anesthesia Procedure Notes (Addendum)
Arterial Line Insertion Start/End11/12/2019 4:30 PM, 09/01/2020 4:35 PM Performed by: Rande Brunt, CRNA, CRNA  Patient location: OR. Preanesthetic checklist: patient identified, IV checked, site marked, risks and benefits discussed, surgical consent, monitors and equipment checked, pre-op evaluation, timeout performed and anesthesia consent Lidocaine 1% used for infiltration Right, radial was placed Catheter size: 20 G Hand hygiene performed  and maximum sterile barriers used   Attempts: 1 Procedure performed without using ultrasound guided technique. Following insertion, dressing applied. Post procedure assessment: normal and unchanged  Patient tolerated the procedure well with no immediate complications.

## 2020-09-01 NOTE — Anesthesia Postprocedure Evaluation (Signed)
Anesthesia Post Note  Patient: Candace [name]  Procedure(s) Performed: EXPLORATORY LAPAROTOMY; Repair of Perforated Duodenal Ulcer (N/A Abdomen) LYSIS OF ADHESION (N/A Abdomen)     Patient location during evaluation: Other Anesthesia Type: General Level of consciousness: awake and alert, patient cooperative and oriented Pain management: pain level controlled Vital Signs Assessment: post-procedure vital signs reviewed and stable Respiratory status: spontaneous breathing, nonlabored ventilation, respiratory function stable and patient connected to face mask oxygen Cardiovascular status: blood pressure returned to baseline and stable Postop Assessment: no apparent nausea or vomiting Anesthetic complications: no   No complications documented.  Last Vitals:  Vitals:   09/01/20 1825 09/01/20 1840  BP:  104/62  Pulse:  90  Resp:    Temp: (!) 36.1 C   SpO2:      Last Pain:  Vitals:   09/01/20 1840  TempSrc:   PainSc: 0-No pain                 Ameerah Huffstetler,E. Rashad Obeid

## 2020-09-01 NOTE — Progress Notes (Addendum)
Informed by radiology-that CT scan is positive for bowel perforation with pneumomediastinum and free fluid density in the abdomen.  On reevaluation-patient continues to have severe pain-asking for more morphine.  On exam: Tenderness in the upper abdominal area-more guarding than this morning.  Plan:  Keep n.p.o. Start IV fluid and Zosyn Hold further dosing of Eliquis General surgery has been consulted-will await further recs  Family-daughter updated regarding CT findings and plan for surgical consult.

## 2020-09-01 NOTE — Progress Notes (Signed)
OT Cancellation Note  Patient Details Name: Arneta Mahmood MRN: [id number] DOB: [birth year]   Cancelled Treatment:    Reason Eval/Treat Not Completed: Patient not medically ready (Per MD in room, new bowel perforation found. Plan is to take pt to OR for emergent exlap. Will follow as available and appropriate)  Zenovia Jarred, MSOT, OTR/L [facility] Office Number: [phone] Pager: [phone]  Zenovia Jarred 09/01/2020, 5:29 PM

## 2020-09-01 NOTE — Progress Notes (Addendum)
PROGRESS NOTE                                                                                                                                                                                                             Patient Demographics:    Candace [name], is a 69 y.o. female, DOB - 1951/05/06, XTG:[id number]  Outpatient Primary MD for the patient is [name], [name], MD   Admit date - 08/31/2020   LOS - 0  No chief complaint on file.      Brief Narrative: Patient is a 69 y.o. female was recently hospitalized for COVID-19 pneumonia-and discharged on 2-3 L of home O2-presents with worsening shortness of breath-found to have A. fib with RVR, worsening hypoxia requiring up to 6 L of oxygen and subsequently admitted to the hospitalist service.  See below for further details.  COVID-19 vaccinated status: Unvaccinated  Significant Events: 10/25-10/31>> hospitalization for COVID-19 pneumonia-discharged on 2-3 L of oxygen. 11/2>> Admit to [facility] for worsening hypoxemia requiring 6 L of oxygen, A. fib with RVR  Significant studies: 10/26>> bilateral lower extremity Doppler: No DVT. 10/27>> renal ultrasound: Cystic changes at the renal hila likely parapelvic cysts.  Hydronephrosis felt to be less likely. 11/2>>Chest x-ray: Persistent bilateral pulmonary infiltrates consistent with multifocal pneumonia 11/2>> CTA chest: No PE, areas of consolidation and groundglass density bilaterally. 11/3>> Echo: EF 70-75%  COVID-19 medications: Steroids: Resume 11/2>> Remdesivir: 10/25>> 10/29  Antibiotics: Rocephin: 11/2>> Zithromax: 11/12>>  Microbiology data: 10/25 >>blood culture: No growth  Procedures: None  Consults: None  DVT prophylaxis: Place TED hose Start: 08/31/20 1711 Place TED hose Start: 08/31/20 1507 apixaban (ELIQUIS) tablet 5 mg     Subjective:    Candace [name] today claims her breathing is better-however she  is complaining of some upper abdominal pain that just started earlier this morning/few minutes before I saw her.   Assessment  & Plan :   Acute Hypoxic Resp Failure due to Covid 19 Viral pneumonia: Appears comfortable at rest-appears to have improved overnight-Down to around 2-2 L of oxygen this morning.  On IV steroids-continue empiric IV Rocephin/Zithromax for now-obtain procalcitonin levels-suspect she does not have any superimposed bacterial pneumonia-we will have low threshold to discontinue antibiotics of the next few days.  Fever: afebrile O2 requirements:  SpO2: 96 % O2 Flow Rate (L/min): 5 L/min  COVID-19 Labs: Recent Labs    08/31/20 1922  LDH 533*  CRP 9.1*       Component Value Date/Time   BNP 51.0 08/29/2020 0323    Recent Labs  Lab 08/26/20 0145 08/27/20 0038 08/28/20 0241 08/29/20 0323  PROCALCITON 0.80 1.10 0.54 0.36    Lab Results  Component Value Date   SARSCOV2NAA POSITIVE (A) 08/23/2020     Prone/Incentive Spirometry: encouraged incentive spirometry use 3-4/hour.  Atrial fibrillation with RVR: Rate better controlled-on Cardizem infusion and oral metoprolol--on Eliquis.  CHA2DS2-VASc score of at least 3.  Echo with preserved EF.  AKI on CKD stage IIIb: AKI hemodynamically mediated-improved after hydration and close to baseline.  Abdominal pain: Mostly in the upper abdomen-started today-not relieved by PPI/antacid-check lipase-obtain CT abdomen.  Abdomen is soft with some mild tenderness in the upper abdomen area.  There are no peritoneal signs.  HLD: Continue statin  DM-2 (A1c 7.0 on 10/26) with uncontrolled hyperglycemia due to steroids: CBGs on the higher side-start Levemir 10 units twice daily, 4 units of NovoLog with meals-continue SSI-follow and adjust.  Recent Labs    08/31/20 1704 09/01/20 0825  GLUCAP 244* 300*    History of colon cancer-s/p colostomy  Obesity: Estimated body mass index is 33.42 kg/m as calculated from the  following:   Height as of this encounter: 5\' 8"  (1.727 m).   Weight as of this encounter: 99.7 kg.    GI prophylaxis: PPI  ABG:    Component Value Date/Time   PHART 7.478 (H) 08/28/2020 1818   PCO2ART 33.6 08/28/2020 1818   PO2ART 56.8 (L) 08/28/2020 1818   HCO3 24.6 08/28/2020 1818   O2SAT 89.0 08/28/2020 1818    Vent Settings: N/A   Condition -Stable  Family Communication  :  Daughter [name] [phone]) updated over the phone 11/3  Code Status :  Full Code Diet :  Diet Order            Diet heart healthy/carb modified Room service appropriate? Yes; Fluid consistency: Thin  Diet effective now                  Disposition Plan  :   Status is: Observation  The patient will require care spanning > 2 midnights and should be moved to inpatient because: Inpatient level of care appropriate due to severity of illness  Dispo: The patient is from: Home              Anticipated d/c is to: Home              Anticipated d/c date is: > 3 days              Patient currently is not medically stable to d/c.    Barriers to discharge: Hypoxia requiring O2 supplementation  Antimicorbials  :    Anti-infectives (From admission, onward)   Start     Dose/Rate Route Frequency Ordered Stop   09/01/20 1000  levofloxacin (LEVAQUIN) tablet 250 mg  Status:  Discontinued        250 mg Oral Daily 08/31/20 1508 08/31/20 1735   08/31/20 1730  cefTRIAXone (ROCEPHIN) 1 g in sodium chloride 0.9 % 100 mL IVPB        1 g 200 mL/hr over 30 Minutes Intravenous Every 24 hours 08/31/20 1726 09/05/20 1729   08/31/20 1730  azithromycin (ZITHROMAX) 500 mg in sodium chloride 0.9 % 250 mL IVPB        500 mg 250  mL/hr over 60 Minutes Intravenous Every 24 hours 08/31/20 1726 09/05/20 1729      Inpatient Medications  Scheduled Meds: . amLODipine  10 mg Oral Daily  . apixaban  5 mg Oral BID  . aspirin EC  81 mg Oral Daily  . insulin aspart  0-20 Units Subcutaneous TID WC  . insulin  aspart  4 Units Subcutaneous TID WC  . insulin detemir  10 Units Subcutaneous BID  . methylPREDNISolone (SOLU-MEDROL) injection  50 mg Intravenous Q12H  . metoprolol tartrate  25 mg Oral BID  . pantoprazole (PROTONIX) IV  40 mg Intravenous Q24H  . rosuvastatin  40 mg Oral Daily  . sodium bicarbonate  650 mg Oral BID  . zinc sulfate  220 mg Oral Daily   Continuous Infusions: . azithromycin Stopped (08/31/20 2059)  . cefTRIAXone (ROCEPHIN)  IV Stopped (08/31/20 1945)  . diltiazem (CARDIZEM) infusion 7.5 mg/hr (08/31/20 1428)  . lactated ringers 10 mL/hr at 09/01/20 0858   PRN Meds:.albuterol, alum & mag hydroxide-simeth, metoprolol tartrate, morphine injection   Time Spent in minutes  35    See all Orders from today for further details   Oren Binet M.D on 09/01/2020 at 11:45 AM  To page go to [email or web address] - use universal password  [facility] -  Office  [phone]    Objective:   Vitals:   08/31/20 2115 09/01/20 0005 09/01/20 0348 09/01/20 0738  BP: (!) 101/55 94/62 (!) 100/56 (!) 103/57  Pulse: (!) 103 75 69 77  Resp: 19 17 16  (!) 24  Temp: 99.1 F (37.3 C) 97.8 F (36.6 C) 97.7 F (36.5 C) 98.2 F (36.8 C)  TempSrc: Oral Oral Oral Oral  SpO2:  100% 99% 96%  Weight: 99.7 kg     Height: 5\' 8"  (1.727 m)       Wt Readings from Last 3 Encounters:  08/31/20 99.7 kg  08/23/20 99.8 kg     Intake/Output Summary (Last 24 hours) at 09/01/2020 1145 Last data filed at 09/01/2020 0528 Gross per 24 hour  Intake 100 ml  Output 945 ml  Net -845 ml     Physical Exam Gen Exam:Alert awake-not in any distress HEENT:atraumatic, normocephalic Chest: B/L clear to auscultation anteriorly CVS:S1S2 regular Abdomen: Soft-some tenderness in the upper abdominal area without any peritoneal signs.  Colostomy in place-brown stools present. Extremities:no edema Neurology: Non focal Skin: no rash   Data Review:    CBC Recent Labs  Lab 08/26/20 0145  08/26/20 0145 08/27/20 0038 08/28/20 0241 08/29/20 0323 08/31/20 1315 09/01/20 0043  WBC 10.7*   < > 11.3* 10.8* 12.8* 14.5* 11.6*  HGB 12.9   < > 12.9 13.0 13.6 13.4 12.1  HCT 40.0   < > 40.3 41.2 43.6 42.4 39.5  PLT 287   < > 335 308 366 365 324  MCV 71.6*   < > 70.7* 72.0* 73.6* 73.2* 76.1*  MCH 23.1*   < > 22.6* 22.7* 23.0* 23.1* 23.3*  MCHC 32.3   < > 32.0 31.6 31.2 31.6 30.6  RDW 15.5   < > 15.4 15.3 15.4 15.1 15.3  LYMPHSABS 0.3*  --  0.4* 0.4* 0.4* 0.4*  --   MONOABS 1.1*  --  1.7* 1.5* 1.4* 1.2*  --   EOSABS 0.0  --  0.0 0.0 0.0 0.0  --   BASOSABS 0.0  --  0.0 0.0 0.0 0.0  --    < > = values in this interval not displayed.  Chemistries  Recent Labs  Lab 08/26/20 0145 08/26/20 0145 08/27/20 0038 08/28/20 0241 08/29/20 0323 08/31/20 1315 09/01/20 0043  NA 140   < > 146* 143 143 136 142  K 4.1   < > 4.3 4.4 4.7 4.9 5.1  CL 109   < > 112* 109 108 102 110  CO2 19*   < > 23 23 25  19* 19*  GLUCOSE 295*   < > 154* 216* 269* 316* 312*  BUN 97*   < > 81* 64* 56* 60* 45*  CREATININE 1.99*   < > 1.53* 1.38* 1.45* 1.69* 1.51*  CALCIUM 10.1   < > 10.7* 10.3 10.4* 10.1 9.4  MG 2.1  --  2.0 1.8 1.7 1.8  --   AST 22  --  23 26 29 29   --   ALT 19  --  18 21 25 21   --   ALKPHOS 55  --  53 55 59 57  --   BILITOT 0.3  --  0.5 0.3 0.6 1.2  --    < > = values in this interval not displayed.   ------------------------------------------------------------------------------------------------------------------ No results for input(s): CHOL, HDL, LDLCALC, TRIG, CHOLHDL, LDLDIRECT in the last 72 hours.  Lab Results  Component Value Date   HGBA1C 7.0 (H) 08/24/2020   ------------------------------------------------------------------------------------------------------------------ Recent Labs    08/31/20 1220  TSH 0.015*   ------------------------------------------------------------------------------------------------------------------ No results for input(s): VITAMINB12,  FOLATE, FERRITIN, TIBC, IRON, RETICCTPCT in the last 72 hours.  Coagulation profile No results for input(s): INR, PROTIME in the last 168 hours.  No results for input(s): DDIMER in the last 72 hours.  Cardiac Enzymes No results for input(s): CKMB, TROPONINI, MYOGLOBIN in the last 168 hours.  Invalid input(s): CK ------------------------------------------------------------------------------------------------------------------    Component Value Date/Time   BNP 51.0 08/29/2020 0323    Micro Results Recent Results (from the past 240 hour(s))  Blood Culture (routine x 2)     Status: None   Collection Time: 08/23/20 11:23 AM   Specimen: BLOOD RIGHT ARM  Result Value Ref Range Status   Specimen Description BLOOD RIGHT ARM  Final   Special Requests   Final    BOTTLES DRAWN AEROBIC ONLY Blood Culture adequate volume   Culture   Final    NO GROWTH 5 DAYS Performed at [facility], [address], [place], [place] [address]    Report Status 08/28/2020 FINAL  Final  Blood Culture (routine x 2)     Status: None   Collection Time: 08/23/20 11:53 AM   Specimen: BLOOD RIGHT HAND  Result Value Ref Range Status   Specimen Description BLOOD RIGHT HAND  Final   Special Requests   Final    BOTTLES DRAWN AEROBIC AND ANAEROBIC Blood Culture results may not be optimal due to an inadequate volume of blood received in culture bottles   Culture   Final    NO GROWTH 5 DAYS Performed at [facility], [place] [address], [place], [place] [address]    Report Status 08/28/2020 FINAL  Final  Resp Panel by RT PCR (RSV, Flu A&B, Covid) - Nasopharyngeal Swab     Status: Abnormal   Collection Time: 08/23/20  2:36 PM   Specimen: Nasopharyngeal Swab  Result Value Ref Range Status   SARS Coronavirus 2 by RT PCR POSITIVE (A) NEGATIVE Final    Comment: RESULT CALLED TO, READ BACK BY AND VERIFIED WITH: A COLEMAN RN 08/23/20 AT 1740 SK (NOTE) SARS-CoV-2 target nucleic acids  are  DETECTED.  SARS-CoV-2 RNA is generally detectable in upper respiratory specimens  during the acute phase of infection. Positive results are indicative of the presence of the identified virus, but do not rule out bacterial infection or co-infection with other pathogens not detected by the test. Clinical correlation with patient history and other diagnostic information is necessary to determine patient infection status. The expected result is Negative.  Fact Sheet for Patients:  PinkCheek.be  Fact Sheet for Healthcare Providers: GravelBags.it  This test is not yet approved or cleared by the [place] FDA and  has been authorized for detection and/or diagnosis of SARS-CoV-2 by FDA under an Emergency Use Authorization (EUA).  This EUA will remain in effect (meaning this test can be [place] ed) for the duration of  the COVID-19 declaration under Section 564(b)(1) of the Act, 21 U.S.C. section 360bbb-3(b)(1), unless the authorization is terminated or revoked sooner.      Influenza A by PCR NEGATIVE NEGATIVE Final   Influenza B by PCR NEGATIVE NEGATIVE Final    Comment: (NOTE) The Xpert Xpress SARS-CoV-2/FLU/RSV assay is intended as an aid in  the diagnosis of influenza from Nasopharyngeal swab specimens and  should not be used as a sole basis for treatment. Nasal washings and  aspirates are unacceptable for Xpert Xpress SARS-CoV-2/FLU/RSV  testing.  Fact Sheet for Patients: PinkCheek.be  Fact Sheet for Healthcare Providers: GravelBags.it  This test is not yet approved or cleared by the [place] FDA and  has been authorized for detection and/or diagnosis of SARS-CoV-2 by  FDA under an Emergency Use Authorization (EUA). This EUA will remain  in effect (meaning this test can be used) for the duration of the  Covid-19 declaration under Section 564(b)(1) of the Act,  21  U.S.C. section 360bbb-3(b)(1), unless the authorization is  terminated or revoked.    Respiratory Syncytial Virus by PCR NEGATIVE NEGATIVE Final    Comment: (NOTE) Fact Sheet for Patients: PinkCheek.be  Fact Sheet for Healthcare Providers: GravelBags.it  This test is not yet approved or cleared by the [place] FDA and  has been authorized for detection and/or diagnosis of SARS-CoV-2 by  FDA under an Emergency Use Authorization (EUA). This EUA will remain  in effect (meaning this test can be used) for the duration of the  COVID-19 declaration under Section 564(b)(1) of the Act, 21 U.S.C.  section 360bbb-3(b)(1), unless the authorization is terminated or  revoked. Performed at [facility], [place] [address], [place],  [address]   MRSA PCR Screening     Status: None   Collection Time: 08/24/20  8:50 AM   Specimen: Nasal Mucosa; Nasopharyngeal  Result Value Ref Range Status   MRSA by PCR NEGATIVE NEGATIVE Final    Comment:        The GeneXpert MRSA Assay (FDA approved for NASAL specimens only), is one component of a comprehensive MRSA colonization surveillance program. It is not intended to diagnose MRSA infection nor to guide or monitor treatment for MRSA infections. Performed at [facility], [place] [address], [place],  [address]     Radiology Reports CT Angio Chest PE W and/or Wo Contrast  Result Date: 08/31/2020 CLINICAL DATA:  Hypoxia, positive COVID EXAM: CT ANGIOGRAPHY CHEST WITH CONTRAST TECHNIQUE: Multidetector CT imaging of the chest was performed using the standard protocol during bolus administration of intravenous contrast. Multiplanar CT image reconstructions and MIPs were obtained to evaluate the vascular anatomy. CONTRAST:  15mL OMNIPAQUE IOHEXOL 350 MG/ML SOLN  COMPARISON:  None. FINDINGS: Cardiovascular: Satisfactory opacification of the pulmonary arteries to the segmental  level. No evidence of pulmonary embolism. Normal heart size. No pericardial effusion. Mediastinum/Nodes: No enlarged lymph nodes. Lungs/Pleura: Bilateral areas of consolidation and ground-glass density with a lower lobe predominance. No pleural effusion. No pneumothorax. Upper Abdomen: Partially imaged left adrenal nodule was present on 2015 abdominal CT. Right adrenal nodule was also present in 2015. Musculoskeletal: Degenerative changes of the included spine. Review of the MIP images confirms the above findings. IMPRESSION: No evidence of acute pulmonary embolism. COVID-19 pneumonia. Electronically Signed   By: Macy Mis M.D.   On: 08/31/2020 16:19   US RENAL  Result Date: 08/25/2020 CLINICAL DATA:  Acute kidney injury. EXAM: RENAL / URINARY TRACT ULTRASOUND COMPLETE COMPARISON:  Abdominal CT 06/07/2014 FINDINGS: Right Kidney: Renal measurements: 10.7 x 5.2 x 5.0 cm = volume: 146 mL. Cystic changes at the renal hila likely parapelvic cysts as seen on prior CT. Normal renal parenchymal echogenicity. No shadowing calculus. No solid renal mass. Left Kidney: Renal measurements: 10.7 x 6.2 x 5.7 cm = volume: 197 mL. Cystic changes at the renal hila likely parapelvic cyst is seen on prior CT. Normal renal parenchymal echogenicity. No shadowing calculus. No solid renal mass. Bladder: Appears normal for degree of bladder distention. Both ureteral jets are seen. Other: None. IMPRESSION: Cystic changes at the renal hila likely parapelvic cysts as seen on prior CT. Mild bilateral hydronephrosis is felt less likely as both ureteral jets are seen in the bladder. Electronically Signed   By: Keith Rake M.D.   On: 08/25/2020 01:55   DG Chest Portable 1 View  Result Date: 08/31/2020 CLINICAL DATA:  Dyspnea at home health check up this morning, had COVID-19 diagnosis 10 days prior, recent discharge, new hypoxia EXAM: PORTABLE CHEST 1 VIEW COMPARISON:  Portable exam 1237 hours compared to 08/27/2020 FINDINGS:  Normal heart size, mediastinal contours, and pulmonary vascularity. Atherosclerotic calcification aorta. Persistent infiltrates identified in the mid to lower lungs bilaterally consistent with multifocal pneumonia and COVID-19. Appearance unchanged from previous exam. No pleural effusion or pneumothorax. IMPRESSION: Persistent BILATERAL pulmonary infiltrates consistent with multifocal pneumonia and COVID-19. Electronically Signed   By: Lavonia Dana M.D.   On: 08/31/2020 12:45   DG Chest Port 1 View  Result Date: 08/27/2020 CLINICAL DATA:  Shortness of breath EXAM: PORTABLE CHEST 1 VIEW COMPARISON:  08/25/2020 FINDINGS: No significant interval change in AP portable examination with heterogeneous bibasilar airspace opacity and possible small layering pleural effusions. No new airspace opacity. Cardiomegaly. IMPRESSION: No significant interval change in AP portable examination with heterogeneous bibasilar airspace opacity and possible small layering pleural effusions, consistent with multifocal infection. No new airspace opacity. Electronically Signed   By: Eddie Candle M.D.   On: 08/27/2020 11:04   DG Chest Port 1 View  Result Date: 08/25/2020 CLINICAL DATA:  Shortness of breath.  COVID-19 positive. EXAM: PORTABLE CHEST 1 VIEW COMPARISON:  August 23, 2020. FINDINGS: Stable cardiomediastinal silhouette. No pneumothorax is noted. Stable bilateral lung opacities are noted concerning for multifocal pneumonia. Bony thorax is unremarkable. IMPRESSION: Stable bilateral lung opacities are noted concerning for multifocal pneumonia. Electronically Signed   By: Marijo Conception M.D.   On: 08/25/2020 09:36   DG Chest Port 1 View  Result Date: 08/23/2020 CLINICAL DATA:  Shortness of breath EXAM: PORTABLE CHEST 1 VIEW COMPARISON:  None. FINDINGS: There is ill-defined airspace opacity in portions of each mid and lower lung region. Heart size and pulmonary vascularity  are normal. No adenopathy. There is degenerative  change in the left shoulder. IMPRESSION: Ill-defined airspace opacity in the mid and lower lung regions, most indicative of multifocal pneumonia. Question atypical organism pneumonia given this appearance. Check of COVID-19 status in this regard advised. Heart size within normal limits.  No adenopathy appreciable. Electronically Signed   By: Lowella Grip III M.D.   On: 08/23/2020 11:33   ECHOCARDIOGRAM COMPLETE  Result Date: 09/01/2020    ECHOCARDIOGRAM REPORT   Patient Name:   SHADAVIA DAMPIER Date of Exam: 09/01/2020 Medical Rec #:  [id number]    Height:       68.0 in Accession #:    [id number]   Weight:       219.8 lb Date of Birth:  [birth year]    BSA:          2.127 m Patient Age:    44 years     BP:           103/57 mmHg Patient Gender: F            HR:           77 bpm. Exam Location:  Inpatient Procedure: 2D Echo Indications:    atrial fibrillation 427.31  History:        Patient has no prior history of Echocardiogram examinations.                 Risk Factors:Hypertension, Diabetes and Dyslipidemia. Covid +.  Sonographer:    Jannett Celestine RDCS (AE) Referring Phys: [id number] AMY N [facility]  Sonographer Comments: Technically difficult study due to poor echo windows. Restricted mobility. IMPRESSIONS  1. Left ventricular ejection fraction, by estimation, is 70 to 75%. The left ventricle has hyperdynamic function. Left ventricular endocardial border not optimally defined to evaluate regional wall motion, but appears grossly normal. There is mild left ventricular hypertrophy.  2. RV is not well visualized due to image quality. Right ventricular systolic function is mildly reduced. The right ventricular size is mildly enlarged. TR signal inadequate for assessing RVSP.  3. The mitral valve is normal in structure. Trivial mitral valve regurgitation. No evidence of mitral stenosis.  4. The aortic valve is tricuspid. Aortic valve regurgitation is not visualized. No aortic stenosis is present.  5. The inferior vena cava is  normal in size with greater than 50% respiratory variability, suggesting right atrial pressure of 3 mmHg. FINDINGS  Left Ventricle: Midcavitary gradient 17.5 mmHg, Vmax 2 m/s. Left ventricular ejection fraction, by estimation, is 70 to 75%. The left ventricle has hyperdynamic function. Left ventricular endocardial border not optimally defined to evaluate regional wall motion. The left ventricular internal cavity size was normal in size. There is mild left ventricular hypertrophy. Left ventricular diastolic parameters are indeterminate. Right Ventricle: RV is not well visualized. The right ventricular size is mildly enlarged. No increase in right ventricular wall thickness. Right ventricular systolic function is mildly reduced. Tricuspid regurgitation signal is inadequate for assessing PA pressure. Left Atrium: Left atrial size was normal in size. Right Atrium: Right atrial size was normal in size. Pericardium: There is no evidence of pericardial effusion. Mitral Valve: The mitral valve is normal in structure. Trivial mitral valve regurgitation. No evidence of mitral valve stenosis. Tricuspid Valve: The tricuspid valve is normal in structure. Tricuspid valve regurgitation is not demonstrated. No evidence of tricuspid stenosis. Aortic Valve: The aortic valve is tricuspid. Aortic valve regurgitation is not visualized. No aortic stenosis is present. Pulmonic Valve: The pulmonic valve was grossly  normal. Pulmonic valve regurgitation is trivial. No evidence of pulmonic stenosis. Aorta: The aortic root is normal in size and structure. Venous: The inferior vena cava is normal in size with greater than 50% respiratory variability, suggesting right atrial pressure of 3 mmHg. IAS/Shunts: No atrial level shunt detected by color flow Doppler.  LEFT VENTRICLE PLAX 2D LVIDd:         3.45 cm LVIDs:         2.30 cm LV PW:         1.20 cm LV IVS:        1.15 cm LVOT diam:     2.20 cm LV SV:         70 LV SV Index:   33 LVOT Area:      3.80 cm  LEFT ATRIUM           Index LA diam:      3.20 cm 1.50 cm/m LA Vol (A2C): 32.9 ml 15.46 ml/m  AORTIC VALVE LVOT Vmax:   101.00 cm/s LVOT Vmean:  85.000 cm/s LVOT VTI:    0.185 m  AORTA Ao Root diam: 2.90 cm MITRAL VALVE MV Area (PHT): 2.95 cm    SHUNTS MV Decel Time: 257 msec    Systemic VTI:  0.18 m MV E velocity: 78.00 cm/s  Systemic Diam: 2.20 cm Cherlynn Kaiser MD Electronically signed by Cherlynn Kaiser MD Signature Date/Time: 09/01/2020/11:02:05 AM    Final    VAS [place] LOWER EXTREMITY VENOUS (DVT)  Result Date: 08/24/2020  Lower Venous DVTStudy Indications: Elevated d-dimer, covid.  Comparison Study: No prior studies. Performing Technologist: Darlin Coco  Examination Guidelines: A complete evaluation includes B-mode imaging, spectral Doppler, color Doppler, and power Doppler as needed of all accessible portions of each vessel. Bilateral testing is considered an integral part of a complete examination. Limited examinations for reoccurring indications may be performed as noted. The reflux portion of the exam is performed with the patient in reverse Trendelenburg.  +---------+---------------+---------+-----------+----------+--------------+ RIGHT    CompressibilityPhasicitySpontaneityPropertiesThrombus Aging +---------+---------------+---------+-----------+----------+--------------+ CFV      Full           Yes      Yes                                 +---------+---------------+---------+-----------+----------+--------------+ SFJ      Full                                                        +---------+---------------+---------+-----------+----------+--------------+ FV Prox  Full                                                        +---------+---------------+---------+-----------+----------+--------------+ FV Mid   Full                                                        +---------+---------------+---------+-----------+----------+--------------+ FV  DistalFull                                                        +---------+---------------+---------+-----------+----------+--------------+  PFV      Full                                                        +---------+---------------+---------+-----------+----------+--------------+ POP      Full           Yes      Yes                                 +---------+---------------+---------+-----------+----------+--------------+ PTV      Full                                                        +---------+---------------+---------+-----------+----------+--------------+ PERO     Full                                                        +---------+---------------+---------+-----------+----------+--------------+   +---------+---------------+---------+-----------+----------+--------------+ LEFT     CompressibilityPhasicitySpontaneityPropertiesThrombus Aging +---------+---------------+---------+-----------+----------+--------------+ CFV      Full           Yes      Yes                                 +---------+---------------+---------+-----------+----------+--------------+ SFJ      Full                                                        +---------+---------------+---------+-----------+----------+--------------+ FV Prox  Full                                                        +---------+---------------+---------+-----------+----------+--------------+ FV Mid   Full                                                        +---------+---------------+---------+-----------+----------+--------------+ FV DistalFull                                                        +---------+---------------+---------+-----------+----------+--------------+ PFV      Full                                                        +---------+---------------+---------+-----------+----------+--------------+  POP      Full           Yes      Yes                                  +---------+---------------+---------+-----------+----------+--------------+ PTV      Full                                                        +---------+---------------+---------+-----------+----------+--------------+ PERO     Full                                                        +---------+---------------+---------+-----------+----------+--------------+     Summary: RIGHT: - There is no evidence of deep vein thrombosis in the lower extremity.  - No cystic structure found in the popliteal fossa.  LEFT: - There is no evidence of deep vein thrombosis in the lower extremity.  - No cystic structure found in the popliteal fossa.  *See table(s) above for measurements and observations. Electronically signed by Deitra Mayo MD on 08/24/2020 at 5:36:23 PM.    Final

## 2020-09-01 NOTE — Op Note (Signed)
   Operative Note   Date: 09/01/2020  Procedure: exploratory laparotomy, adhesiolysis, modified graham patch repair of perforated duodenal ulcer  Pre-op diagnosis: pneumoperitoneum Post-op diagnosis: perforated duodenal ulcer  Indication and clinical history: The patient is a 69 y.o. year old female with pneumoperitoneum on imaging after presenting with abdominal pain. The patient has a recent diagnosis of infection with COVID-19 and was on therapeutic anticoagulation pre-operatively. A TEG was sent, Kcentra ordered, and crossmatched blood obtained.   Surgeon: Jesusita Oka, MD Assistant: Georgette Dover, MD  Anesthesiologist: Glennon Mac, MD Anesthesia: General  Findings:  . Specimen: none . EBL: <20cc . Drains/Implants: 43F JP drain, LUQ  Disposition: PACU - hemodynamically stable.  Description of procedure: The patient was positioned supine on the operating room table. General anesthetic induction and intubation were uneventful. Foley catheter insertion was performed and was atraumatic. The abdomen was prepped and draped in the usual sterile fashion. Time-out was performed verifying correct patient, procedure, signature of informed consent, and administration of pre-operative antibiotics.   A midline incision was made and deepened down through the fascia. Adhesiolysis was performed for approximately 30 minutes to free the bowel from the abdominal wall. Murky fluid was encountered and suctioned and limited exploration revealed a sub-centimeter perforated duodenal ulcer. A modified Graham patch repair was performed by repairing the perforation and securing a tongue of omentum overlying the repair. The abdomen was copiously irrigated. A drain was placed in the left upper quadrant and overlying the repair and secured with 2-0 nylon suture. The fascia was closed with #1 looped PDS suture and the wound packed wet to dry with kerlix and an ABD pad. A new ostomy appliance was applied.   All sponge and  instrument counts were correct at the conclusion of the procedure. The patient was awakened from anesthesia, extubated uneventfully, and transported to the PACU in good condition. There were no complications.   Clinical update provided to the patient's daughter via phone.    Jesusita Oka, MD General and [facility]

## 2020-09-01 NOTE — Progress Notes (Signed)
PT Cancellation Note  Patient Details Name: Candace [name] MRN: [id number] DOB: 1951-09-06   Cancelled Treatment:    Reason Eval/Treat Not Completed: Medical issues which prohibited therapy CT revealed perforation of bowel and pt to go to surgery today.  Will hold PT as pt not medically stable. Abran Richard, PT Acute Rehab Services Pager [phone] [facility] 09/01/2020, 4:49 PM

## 2020-09-01 NOTE — Progress Notes (Signed)
COVID+ with abdominal pain, CT scan performed revealing perf viscous. S/p TAC with end ileostomy. Scan performed with oral contrast and no obvious leakage of contrast to prox jej. Eliquis this AM for elevated D-dimer. K-centra ordered as well as TEG and T&C for pRBC, FFP, plts, cryo. D/w patient regarding emergent surgical finding and plan for ex-lap, probable bowel resection, any other necessary and indicated procedures. Patient in agreement with surgical plan. Discussed possibility of remaining intubated due to COVID or open abdomen, patient verbalized understanding. States her daughter Orvil Feil is decision-maker if she is incapacitated, phone number [phone]. All questions answered. To OR emergently for exlap.   Jesusita Oka, MD General and [facility]

## 2020-09-01 NOTE — Anesthesia Preprocedure Evaluation (Addendum)
Anesthesia Evaluation  Patient identified by MRN, date of birth, ID band Patient awake    Reviewed: Allergy & Precautions, NPO status , Patient's Chart, lab work & pertinent test resultsPreop documentation limited or incomplete due to emergent nature of procedure.  History of Anesthesia Complications Negative for: history of anesthetic complications  Airway Mallampati: II  TM Distance: >3 FB Neck ROM: Full    Dental  (+) Dental Advisory Given   Pulmonary pneumonia, unresolved,  08/23/2020 COVID positive  11/2/2021Chest x-ray: Persistent bilateral pulmonary infiltrates consistent with multifocal pneumonia   breath sounds clear to auscultation       Cardiovascular hypertension, Pt. on medications (-) angina+ dysrhythmias Atrial Fibrillation  Rhythm:Irregular Rate:Tachycardia  09/01/2020 ECHO: EF 70-75%m no significant valvular abnormalities   Neuro/Psych negative neurological ROS     GI/Hepatic Free air abdomen: abd pain, no emesis   Endo/Other  diabetes (glu 340), Oral Hypoglycemic AgentsMorbid obesity  Renal/GU Renal InsufficiencyRenal disease     Musculoskeletal   Abdominal (+) + obese,   Peds  Hematology Eliquis: Dr Bobbye Morton has ordered Wandalee Ferdinand   Anesthesia Other Findings   Reproductive/Obstetrics                            Anesthesia Physical Anesthesia Plan  ASA: III and emergent  Anesthesia Plan: General   Post-op Pain Management:    Induction: Intravenous, Rapid sequence and Cricoid pressure planned  PONV Risk Score and Plan: 3 and Ondansetron, Dexamethasone and Treatment may vary due to age or medical condition  Airway Management Planned: Oral ETT and Video Laryngoscope Planned  Additional Equipment: Arterial line  Intra-op Plan:   Post-operative Plan: Possible Post-op intubation/ventilation  Informed Consent: I have reviewed the patients History and Physical, chart,  labs and discussed the procedure including the risks, benefits and alternatives for the proposed anesthesia with the patient or authorized representative who has indicated his/her understanding and acceptance.     Dental advisory given  Plan Discussed with: CRNA and Surgeon  Anesthesia Plan Comments:         Anesthesia Quick Evaluation

## 2020-09-01 NOTE — Progress Notes (Signed)
  Echocardiogram 2D Echocardiogram has been performed.  Candace [name] 09/01/2020, 9:45 AM

## 2020-09-01 NOTE — Progress Notes (Addendum)
Pharmacy Antibiotic Note  Candace [name] is a 69 y.o. female admitted on 08/31/2020 with atrial fibrillation with RVR, recent COVID pneumonia, leukocytosis, AKI and CKD stage IIIb.  Pt has hx of colon cancer, S/P colostomy. Pt now with abdominal pain and bowel perforation on CT of abdomen today. Pharmacy has been consulted for Zosyn dosing.  Pt has been receiving azithromycin and ceftriaxone for CAP since 08/31/20.  WBC 11.6, Tmax 991.K; Scr 1.51, CrCl 43.4 ml/min  Plan: Zosyn 3.375 gm IV Q 8 hrs (extended infusion) Discontinue ceftriaxone Monitor WBC, temp, clinical improvement, renal function  Height: 5\' 8"  (172.7 cm) Weight: 99.7 kg (219 lb 12.8 oz) IBW/kg (Calculated) : 63.9  Temp (24hrs), Avg:98.4 F (36.9 C), Min:97.7 F (36.5 C), Max:99.1 F (37.3 C)  Recent Labs  Lab 08/27/20 0038 08/28/20 0241 08/29/20 0323 08/31/20 1315 09/01/20 0043  WBC 11.3* 10.8* 12.8* 14.5* 11.6*  CREATININE 1.53* 1.38* 1.45* 1.69* 1.51*    Estimated Creatinine Clearance: 43.4 mL/min (A) (by C-G formula based on SCr of 1.51 mg/dL (H)).    No Known Allergies  Antimicrobials this admission: 11/2 ceftriaxone >>  11/2 azithromycin >>   Microbiology results: None this admission  Thank you for allowing pharmacy to be a part of this patient's care.  Gillermina Hu, PharmD, BCPS, [facility] Clinical Pharmacist 09/01/2020 3:00 PM    ADDENDUM Pt to OR this afternoon for exploratory lap, adhesiolysis, modified Graham patch repair of perforated duodenal ulcer. Pharmacy was consulted for 2 doses of Zosyn post op; confirmed with Dr. Bobbye Morton that she wants pt to receive 2 doses of Zosyn post op, then Zosyn can be discontinued. Will restart ceftriaxone to finish out 5-day course for CAP since Zosyn is being discontinued after 2 post op doses.

## 2020-09-01 NOTE — Consult Note (Addendum)
[facility] Consult Note  Cheynne Virden 07/22/1951  [id number].    Requesting MD: Oren Binet Chief Complaint/Reason for Consult: pneumoperitoneum  HPI:  Candace [name] is a 69yo female PMH HTN, HLD, DM currently admitted to [facility] with covid-19. She was admitted 10/25 through 10/31 with covid-19, discharged home on supplemental oxygen and on eliquis due to elevated d-dimer. She returned to the ED yesterday when her home health nurse was concerned about elevated heart rate. She was found to be in Afib RVR and is now on cardizem drip with HR 90's. Late last night she developed abdominal pain that progressively got worse today. CT scan was obtained and shows bowel perforation, pneumoperitoneum and intermediate density free fluid in the abdomen; the specific site of perforation is unclear-oral contrast present to the proximal jejunum has not obviously leaked.  General surgery asked to see.  Of note, patient has a prior h/o partial colectomy/colostomy for colon cancer, completion colectomy and ileostomy Nonsmoker  Review of Systems  Constitutional: Negative.   HENT: Negative.   Eyes: Negative.   Respiratory: Positive for shortness of breath.   Cardiovascular: Negative.   Gastrointestinal: Positive for abdominal pain. Negative for nausea and vomiting.  Genitourinary: Negative.   Musculoskeletal: Positive for back pain.  Skin: Negative.    All systems reviewed and otherwise negative except for as above  No family history on file.  Past Medical History:  Diagnosis Date   Diabetes mellitus ([place])    Hyperlipemia    Hypertension     No past surgical history on file.  Social History:  reports that she has never smoked. She has never used smokeless tobacco. She reports that she does not drink alcohol and does not use drugs.  Allergies: No Known Allergies  Medications Prior to Admission  Medication Sig Dispense Refill   acetaminophen (TYLENOL) 500 MG tablet Take 1,000  mg by mouth every 6 (six) hours as needed for mild pain.     albuterol (VENTOLIN HFA) 108 (90 Base) MCG/ACT inhaler Inhale 2 puffs into the lungs every 6 (six) hours as needed for wheezing or shortness of breath. 6.7 g 0   amLODipine (NORVASC) 10 MG tablet Take 1 tablet (10 mg total) by mouth daily. 30 tablet 11   apixaban (ELIQUIS) 2.5 MG TABS tablet Take 1 tablet (2.5 mg total) by mouth 2 (two) times daily. 28 tablet 0   aspirin 81 MG EC tablet Take 81 mg by mouth daily.     levofloxacin (LEVAQUIN) 250 MG tablet Take 1 tablet (250 mg total) by mouth daily. 3 tablet 0   metFORMIN (GLUCOPHAGE-XR) 500 MG 24 hr tablet Take 500 mg by mouth in the morning and at bedtime.      rosuvastatin (CRESTOR) 40 MG tablet Take 40 mg by mouth daily.     zinc gluconate 50 MG tablet Take 50 mg by mouth daily.      Prior to Admission medications   Medication Sig Start Date End Date Taking? Authorizing Provider  acetaminophen (TYLENOL) 500 MG tablet Take 1,000 mg by mouth every 6 (six) hours as needed for mild pain.    [provider]  albuterol (VENTOLIN HFA) 108 (90 Base) MCG/ACT inhaler Inhale 2 puffs into the lungs every 6 (six) hours as needed for wheezing or shortness of breath. 08/29/20   Thurnell Lose, MD  amLODipine (NORVASC) 10 MG tablet Take 1 tablet (10 mg total) by mouth daily. 08/29/20 08/29/21  Thurnell Lose, MD  apixaban (ELIQUIS) 2.5 MG  TABS tablet Take 1 tablet (2.5 mg total) by mouth 2 (two) times daily. 08/29/20   Thurnell Lose, MD  aspirin 81 MG EC tablet Take 81 mg by mouth daily. 06/20/18   [provider]  levofloxacin (LEVAQUIN) 250 MG tablet Take 1 tablet (250 mg total) by mouth daily. 08/29/20   Thurnell Lose, MD  metFORMIN (GLUCOPHAGE-XR) 500 MG 24 hr tablet Take 500 mg by mouth in the morning and at bedtime.  07/16/20   [provider]  rosuvastatin (CRESTOR) 40 MG tablet Take 40 mg by mouth daily. 06/22/20   [provider]  zinc  gluconate 50 MG tablet Take 50 mg by mouth daily.    [provider]    Blood pressure 112/63, pulse 93, temperature 98.5 F (36.9 C), temperature source Oral, resp. rate (!) 28, height 5\' 8"  (1.727 m), weight 99.7 kg, SpO2 97 %. Physical Exam: General: ill appearing female who is laying in bed, lethargic, appears uncomfortable HEENT: head is normocephalic, atraumatic.  Sclera are noninjected.  PERRL.  Ears and nose without any masses or lesions.  Mouth is pink and moist. Dentition fair Heart: irregular rhythm, HR upper 90's.  No obvious murmurs, gallops, or rubs noted.  Palpable pedal pulses bilaterally  Lungs: decreased breath sounds bilaterally, no wheezes, rhonchi, or rales noted.  Respiratory effort nonlabored on 3L [place] Abd: well healed midline incision, mostly soft but distended, diffuse tenderness with guarding, hypoactive BS, ileostomy pink with new pouch in place MS: no BUE edema, calves soft and nontender with mild edema Skin: warm and dry with no masses, lesions, or rashes Psych: A&Ox4, appropriate affect but lethargic Neuro: cranial nerves grossly intact, equal strength in BUE/BLE bilaterally, normal speech, thought process intact  Results for orders placed or performed during the hospital encounter of 08/31/20 (from the past 48 hour(s))  TSH     Status: Abnormal   Collection Time: 08/31/20 12:20 PM  Result Value Ref Range   TSH 0.015 (L) 0.350 - 4.500 uIU/mL    Comment: Performed by a 3rd Generation assay with a functional sensitivity of <=0.01 uIU/mL. Performed at [facility], [place] [address] [place], [place] [address]   Troponin I (High Sensitivity)     Status: Abnormal   Collection Time: 08/31/20 12:20 PM  Result Value Ref Range   Troponin I (High Sensitivity) 45 (H) <18 ng/L    Comment: (NOTE) Elevated high sensitivity troponin I (hsTnI) values and significant  changes across serial measurements may suggest ACS but many other  chronic and acute  conditions are known to elevate hsTnI results.  Refer to the "Links" section for chest pain algorithms and additional  guidance. Performed at [facility], [place] [address], [place], [place] [address]   CBC with Differential     Status: Abnormal   Collection Time: 08/31/20  1:15 PM  Result Value Ref Range   WBC 14.5 (H) 4.0 - 10.5 K/uL   RBC 5.79 (H) 3.87 - 5.11 MIL/uL   Hemoglobin 13.4 12.0 - 15.0 g/dL   HCT 42.4 36 - 46 %   MCV 73.2 (L) 80.0 - 100.0 fL   MCH 23.1 (L) 26.0 - 34.0 pg   MCHC 31.6 30.0 - 36.0 g/dL   RDW 15.1 11.5 - 15.5 %   Platelets 365 150 - 400 K/uL   nRBC 0.0 0.0 - 0.2 %   Neutrophils Relative % 85 %   Neutro Abs 12.5 (H) 1.7 - 7.7 K/uL  Lymphocytes Relative 3 %   Lymphs Abs 0.4 (L) 0.7 - 4.0 K/uL   Monocytes Relative 9 %   Monocytes Absolute 1.2 (H) 0.1 - 1.0 K/uL   Eosinophils Relative 0 %   Eosinophils Absolute 0.0 0.0 - 0.5 K/uL   Basophils Relative 0 %   Basophils Absolute 0.0 0.0 - 0.1 K/uL   Immature Granulocytes 3 %   Abs Immature Granulocytes 0.40 (H) 0.00 - 0.07 K/uL    Comment: Performed at [facility] [address], [place], [place] [address]  Comprehensive metabolic panel     Status: Abnormal   Collection Time: 08/31/20  1:15 PM  Result Value Ref Range   Sodium 136 135 - 145 mmol/L   Potassium 4.9 3.5 - 5.1 mmol/L   Chloride 102 98 - 111 mmol/L   CO2 19 (L) 22 - 32 mmol/L   Glucose, Bld 316 (H) 70 - 99 mg/dL    Comment: Glucose reference range applies only to samples taken after fasting for at least 8 hours.   BUN 60 (H) 8 - 23 mg/dL   Creatinine, Ser 1.69 (H) 0.44 - 1.00 mg/dL   Calcium 10.1 8.9 - 10.3 mg/dL   Total Protein 6.5 6.5 - 8.1 g/dL   Albumin 2.5 (L) 3.5 - 5.0 g/dL   AST 29 15 - 41 U/L   ALT 21 0 - 44 U/L   Alkaline Phosphatase 57 38 - 126 U/L   Total Bilirubin 1.2 0.3 - 1.2 mg/dL   GFR, Estimated 32 (L) >60 mL/min    Comment: (NOTE) Calculated using the CKD-EPI Creatinine Equation (2021)    Anion gap 15 5  - 15    Comment: Performed at [facility] [address] [place], [place] [address]  Magnesium     Status: None   Collection Time: 08/31/20  1:15 PM  Result Value Ref Range   Magnesium 1.8 1.7 - 2.4 mg/dL    Comment: Performed at [facility] [address], [place], [place] [address]  Troponin I (High Sensitivity)     Status: Abnormal   Collection Time: 08/31/20  1:15 PM  Result Value Ref Range   Troponin I (High Sensitivity) 45 (H) <18 ng/L    Comment: (NOTE) Elevated high sensitivity troponin I (hsTnI) values and significant  changes across serial measurements may suggest ACS but many other  chronic and acute conditions are known to elevate hsTnI results.  Refer to the "Links" section for chest pain algorithms and additional  guidance. Performed at [facility], [place] [address] [place], [place] [address]   CBG monitoring, ED     Status: Abnormal   Collection Time: 08/31/20  5:04 PM  Result Value Ref Range   Glucose-Capillary 244 (H) 70 - 99 mg/dL    Comment: Glucose reference range applies only to samples taken after fasting for at least 8 hours.  Lactate dehydrogenase     Status: Abnormal   Collection Time: 08/31/20  7:22 PM  Result Value Ref Range   LDH 533 (H) 98 - 192 U/L    Comment: Performed at [facility], [place] [address], [place], [place] [address]  C-reactive protein     Status: Abnormal   Collection Time: 08/31/20  7:22 PM  Result Value Ref Range   CRP 9.1 (H) <1.0 mg/dL    Comment: Performed at [facility] [address] [place], [place] [address]  Troponin I (High Sensitivity)  Status: Abnormal   Collection Time: 08/31/20  7:22 PM  Result Value Ref Range   Troponin I (High Sensitivity) 36 (H) <18 ng/L    Comment: (NOTE) Elevated high sensitivity troponin I (hsTnI) values and significant  changes across serial measurements may suggest ACS but many other  chronic and acute conditions are known to elevate hsTnI results.   Refer to the "Links" section for chest pain algorithms and additional  guidance. Performed at [facility], [place] [address] [place], [place] [address]   Troponin I (High Sensitivity)     Status: Abnormal   Collection Time: 08/31/20  8:54 PM  Result Value Ref Range   Troponin I (High Sensitivity) 50 (H) <18 ng/L    Comment: (NOTE) Elevated high sensitivity troponin I (hsTnI) values and significant  changes across serial measurements may suggest ACS but many other  chronic and acute conditions are known to elevate hsTnI results.  Refer to the "Links" section for chest pain algorithms and additional  guidance. Performed at [facility], [place] [address], [place], [place] [address]   Lipase, blood     Status: None   Collection Time: 08/31/20  8:54 PM  Result Value Ref Range   Lipase 48 11 - 51 U/L    Comment: Performed at [facility] [address], [place], [place] [address]  Basic metabolic panel     Status: Abnormal   Collection Time: 09/01/20 12:43 AM  Result Value Ref Range   Sodium 142 135 - 145 mmol/L   Potassium 5.1 3.5 - 5.1 mmol/L   Chloride 110 98 - 111 mmol/L   CO2 19 (L) 22 - 32 mmol/L   Glucose, Bld 312 (H) 70 - 99 mg/dL    Comment: Glucose reference range applies only to samples taken after fasting for at least 8 hours.   BUN 45 (H) 8 - 23 mg/dL   Creatinine, Ser 1.51 (H) 0.44 - 1.00 mg/dL   Calcium 9.4 8.9 - 10.3 mg/dL   GFR, Estimated 37 (L) >60 mL/min    Comment: (NOTE) Calculated using the CKD-EPI Creatinine Equation (2021)    Anion gap 13 5 - 15    Comment: Performed at [facility] [address] [place], [place] [address]  CBC     Status: Abnormal   Collection Time: 09/01/20 12:43 AM  Result Value Ref Range   WBC 11.6 (H) 4.0 - 10.5 K/uL   RBC 5.19 (H) 3.87 - 5.11 MIL/uL   Hemoglobin 12.1 12.0 - 15.0 g/dL   HCT 39.5 36 - 46 %   MCV 76.1 (L) 80.0 - 100.0 fL   MCH 23.3 (L) 26.0 - 34.0 pg   MCHC 30.6 30.0 - 36.0 g/dL   RDW  15.3 11.5 - 15.5 %   Platelets 324 150 - 400 K/uL   nRBC 0.0 0.0 - 0.2 %    Comment: Performed at [facility], [place] [address], [place], [place] [address]  ABO/Rh     Status: None   Collection Time: 09/01/20 12:43 AM  Result Value Ref Range   ABO/RH(D)      B POS Performed at [facility] [address] [place], [place] [address]   D-dimer, quantitative (not at [facility])     Status: Abnormal   Collection Time: 09/01/20  7:18 AM  Result Value Ref Range   D-Dimer, Quant 2.53 (H) 0.00 - 0.50 ug/mL-FEU    Comment: (NOTE) At the  manufacturer cut-off value of 0.5 g/mL FEU, this assay has a negative predictive value of 95-100%.This assay is intended for use in conjunction with a clinical pretest probability (PTP) assessment model to exclude pulmonary embolism (PE) and deep venous thrombosis (DVT) in outpatients suspected of PE or DVT. Results should be correlated with clinical presentation. Performed at [facility], [place] [address], [place], [place] [address]   Procalcitonin - Baseline     Status: None   Collection Time: 09/01/20  7:18 AM  Result Value Ref Range   Procalcitonin <0.10 ng/mL    Comment:        Interpretation: PCT (Procalcitonin) <= 0.5 ng/mL: Systemic infection (sepsis) is not likely. Local bacterial infection is possible. (NOTE)       Sepsis PCT Algorithm           Lower Respiratory Tract                                      Infection PCT Algorithm    ----------------------------     ----------------------------         PCT < 0.25 ng/mL                PCT < 0.10 ng/mL          Strongly encourage             Strongly discourage   discontinuation of antibiotics    initiation of antibiotics    ----------------------------     -----------------------------       PCT 0.25 - 0.50 ng/mL            PCT 0.10 - 0.25 ng/mL               OR       >80% decrease in PCT            Discourage initiation of                                             antibiotics      Encourage discontinuation           of antibiotics    ----------------------------     -----------------------------         PCT >= 0.50 ng/mL              PCT 0.26 - 0.50 ng/mL               AND        <80% decrease in PCT             Encourage initiation of                                             antibiotics       Encourage continuation           of antibiotics    ----------------------------     -----------------------------        PCT >= 0.50 ng/mL                  PCT > 0.50 ng/mL  AND         increase in PCT                  Strongly encourage                                      initiation of antibiotics    Strongly encourage escalation           of antibiotics                                     -----------------------------                                           PCT <= 0.25 ng/mL                                                 OR                                        > 80% decrease in PCT                                      Discontinue / Do not initiate                                             antibiotics  Performed at [facility], [address] [place], [place] [address]   Glucose, capillary     Status: Abnormal   Collection Time: 09/01/20  8:25 AM  Result Value Ref Range   Glucose-Capillary 300 (H) 70 - 99 mg/dL    Comment: Glucose reference range applies only to samples taken after fasting for at least 8 hours.  Glucose, capillary     Status: Abnormal   Collection Time: 09/01/20 12:14 PM  Result Value Ref Range   Glucose-Capillary 340 (H) 70 - 99 mg/dL    Comment: Glucose reference range applies only to samples taken after fasting for at least 8 hours.  Prepare Pheresed Platelets     Status: None (Preliminary result)   Collection Time: 09/01/20  3:37 PM  Result Value Ref Range   Unit Number [id number]    Blood Component Type PLTP2 PSORALEN TREATED    Unit division 00    Status of Unit ALLOCATED     Transfusion Status OK TO TRANSFUSE   Glucose, capillary     Status: Abnormal   Collection Time: 09/01/20  3:55 PM  Result Value Ref Range   Glucose-Capillary 288 (H) 70 - 99 mg/dL    Comment: Glucose reference range applies only to samples taken after fasting for at least 8 hours.  Type and screen [place]     Status: None (Preliminary result)   Collection Time: 09/01/20  4:00 PM  Result Value Ref Range   ABO/RH(D) PENDING    Antibody Screen PENDING    Sample Expiration 09/04/2020,2359    Unit Number [id number]    Blood Component Type RED CELLS,LR    Unit division 00    Status of Unit ISSUED    Transfusion Status PENDING    Crossmatch Result PENDING    Unit tag comment      VERBAL ORDERS PER DR DR Tomasita Morrow Performed at [facility], [address] [place], [place] [address]    Unit Number [id number]    Blood Component Type RED CELLS,LR    Unit division 00    Status of Unit ISSUED    Transfusion Status PENDING    Crossmatch Result PENDING    Unit tag comment VERBAL ORDERS PER DR DR LOVETT    CT ABDOMEN PELVIS WO CONTRAST  Addendum Date: 09/01/2020   ADDENDUM REPORT: 09/01/2020 14:53 ADDENDUM: Critical Value/emergent results were called by telephone at the time of interpretation on 09/01/2020 at 1447 hours to Dr. Oren Binet , who verbally acknowledged these results. Electronically Signed   By: Genevie Ann M.D.   On: 09/01/2020 14:53   Result Date: 09/01/2020 CLINICAL DATA:  69 year old female with abdominal pain. COVID-19. History of colon cancer status post resection. EXAM: CT ABDOMEN AND PELVIS WITHOUT CONTRAST TECHNIQUE: Multidetector CT imaging of the abdomen and pelvis was performed following the standard protocol without IV contrast. COMPARISON:  CTA chest yesterday.  CT Abdomen and Pelvis 06/07/2014. FINDINGS: Lower chest: Extensive lower lung pneumonia. No pericardial or pleural effusion. Hepatobiliary: Pneumoperitoneum and intermediate density  free fluid adjacent to the liver. Vicarious contrast excretion to the gallbladder. Grossly normal liver parenchyma. Pancreas: Negative noncontrast pancreas. Spleen: Negative. Adrenals/Urinary Tract: Stable low-density bilateral adrenal adenomas since 2015. Chronic renal parapelvic cysts redemonstrated. No definite hydronephrosis. No acute perinephric stranding. Both ureters are decompressed. Excreted IV contrast in the urinary bladder. Stomach/Bowel: Left lower quadrant ostomy seen in 2015 has been taken down, along with completion total colectomy since that time. 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 There is abnormal free air and intermediate density free fluid in the upper abdomen. Free air continues in the ventral small bowel mesentery toward the pelvis. And free air tracks along the right ileostomy loop. Oral contrast was administered, and has not leaked from the stomach, duodenum, or proximal jejunum. No abnormally dilated loops. And the specific site of bowel perforation is unclear. There are several small bowel loops which might be adhered to the ventral abdominal wall abutting the greater curve of the stomach. Vascular/Lymphatic: Vascular patency is not evaluated in the absence of IV contrast. Mild Calcified aortic atherosclerosis. Normal caliber abdominal aorta. No lymphadenopathy. Reproductive: Retroverted uterus suspected, with surgically absent rectum. Other: Moderate pelvic free fluid with simple fluid density, similar to the 2015 CT. Distal colon appear surgically absent. Musculoskeletal: Advanced disc and endplate degeneration throughout the visible spine. No acute or suspicious osseous lesion identified. IMPRESSION: 1. Positive for bowel perforation: Pneumoperitoneum and intermediate density free fluid in the abdomen. Prior total colectomy. The specific site of perforation is unclear-oral contrast present to the proximal jejunum has not obviously leaked. Note that there may be small bowel loops  adherent to the ventral abdominal wall along the greater curve of the stomach. 2. Extensive bilateral lower lung pneumonia. No pleural effusion. 3. Other abdominal and pelvic viscera are stable since 2015, including bilateral adrenal adenomas. Chronic renal parapelvic cysts. 4. Aortic Atherosclerosis (ICD10-I70.0). Electronically Signed:  By: Genevie Ann M.D. On: 09/01/2020 14:40   CT Angio Chest PE W and/or Wo Contrast  Result Date: 08/31/2020 CLINICAL DATA:  Hypoxia, positive COVID EXAM: CT ANGIOGRAPHY CHEST WITH CONTRAST TECHNIQUE: Multidetector CT imaging of the chest was performed using the standard protocol during bolus administration of intravenous contrast. Multiplanar CT image reconstructions and MIPs were obtained to evaluate the vascular anatomy. CONTRAST:  14mL OMNIPAQUE IOHEXOL 350 MG/ML SOLN COMPARISON:  None. FINDINGS: Cardiovascular: Satisfactory opacification of the pulmonary arteries to the segmental level. No evidence of pulmonary embolism. Normal heart size. No pericardial effusion. Mediastinum/Nodes: No enlarged lymph nodes. Lungs/Pleura: Bilateral areas of consolidation and ground-glass density with a lower lobe predominance. No pleural effusion. No pneumothorax. Upper Abdomen: Partially imaged left adrenal nodule was present on 2015 abdominal CT. Right adrenal nodule was also present in 2015. Musculoskeletal: Degenerative changes of the included spine. Review of the MIP images confirms the above findings. IMPRESSION: No evidence of acute pulmonary embolism. COVID-19 pneumonia. Electronically Signed   By: Macy Mis M.D.   On: 08/31/2020 16:19   DG Chest Portable 1 View  Result Date: 08/31/2020 CLINICAL DATA:  Dyspnea at home health check up this morning, had COVID-19 diagnosis 10 days prior, recent discharge, new hypoxia EXAM: PORTABLE CHEST 1 VIEW COMPARISON:  Portable exam 1237 hours compared to 08/27/2020 FINDINGS: Normal heart size, mediastinal contours, and pulmonary vascularity.  Atherosclerotic calcification aorta. Persistent infiltrates identified in the mid to lower lungs bilaterally consistent with multifocal pneumonia and COVID-19. Appearance unchanged from previous exam. No pleural effusion or pneumothorax. IMPRESSION: Persistent BILATERAL pulmonary infiltrates consistent with multifocal pneumonia and COVID-19. Electronically Signed   By: Lavonia Dana M.D.   On: 08/31/2020 12:45   ECHOCARDIOGRAM COMPLETE  Result Date: 09/01/2020    ECHOCARDIOGRAM REPORT   Patient Name:   FAYE STROHMAN Date of Exam: 09/01/2020 Medical Rec #:  [id number]    Height:       68.0 in Accession #:    [id number]   Weight:       219.8 lb Date of Birth:  [birth year]    BSA:          2.127 m Patient Age:    86 years     BP:           103/57 mmHg Patient Gender: F            HR:           77 bpm. Exam Location:  Inpatient Procedure: 2D Echo Indications:    atrial fibrillation 427.31  History:        Patient has no prior history of Echocardiogram examinations.                 Risk Factors:Hypertension, Diabetes and Dyslipidemia. Covid +.  Sonographer:    Jannett Celestine RDCS (AE) Referring Phys: [id number] AMY N [facility]  Sonographer Comments: Technically difficult study due to poor echo windows. Restricted mobility. IMPRESSIONS  1. Left ventricular ejection fraction, by estimation, is 70 to 75%. The left ventricle has hyperdynamic function. Left ventricular endocardial border not optimally defined to evaluate regional wall motion, but appears grossly normal. There is mild left ventricular hypertrophy.  2. RV is not well visualized due to image quality. Right ventricular systolic function is mildly reduced. The right ventricular size is mildly enlarged. TR signal inadequate for assessing RVSP.  3. The mitral valve is normal in structure. Trivial mitral valve regurgitation. No evidence of mitral stenosis.  4. The aortic valve  is tricuspid. Aortic valve regurgitation is not visualized. No aortic stenosis is present.  5. The  inferior vena cava is normal in size with greater than 50% respiratory variability, suggesting right atrial pressure of 3 mmHg. FINDINGS  Left Ventricle: Midcavitary gradient 17.5 mmHg, Vmax 2 m/s. Left ventricular ejection fraction, by estimation, is 70 to 75%. The left ventricle has hyperdynamic function. Left ventricular endocardial border not optimally defined to evaluate regional wall motion. The left ventricular internal cavity size was normal in size. There is mild left ventricular hypertrophy. Left ventricular diastolic parameters are indeterminate. Right Ventricle: RV is not well visualized. The right ventricular size is mildly enlarged. No increase in right ventricular wall thickness. Right ventricular systolic function is mildly reduced. Tricuspid regurgitation signal is inadequate for assessing PA pressure. Left Atrium: Left atrial size was normal in size. Right Atrium: Right atrial size was normal in size. Pericardium: There is no evidence of pericardial effusion. Mitral Valve: The mitral valve is normal in structure. Trivial mitral valve regurgitation. No evidence of mitral valve stenosis. Tricuspid Valve: The tricuspid valve is normal in structure. Tricuspid valve regurgitation is not demonstrated. No evidence of tricuspid stenosis. Aortic Valve: The aortic valve is tricuspid. Aortic valve regurgitation is not visualized. No aortic stenosis is present. Pulmonic Valve: The pulmonic valve was grossly normal. Pulmonic valve regurgitation is trivial. No evidence of pulmonic stenosis. Aorta: The aortic root is normal in size and structure. Venous: The inferior vena cava is normal in size with greater than 50% respiratory variability, suggesting right atrial pressure of 3 mmHg. IAS/Shunts: No atrial level shunt detected by color flow Doppler.  LEFT VENTRICLE PLAX 2D LVIDd:         3.45 cm LVIDs:         2.30 cm LV PW:         1.20 cm LV IVS:        1.15 cm LVOT diam:     2.20 cm LV SV:         70 LV SV  Index:   33 LVOT Area:     3.80 cm  LEFT ATRIUM           Index LA diam:      3.20 cm 1.50 cm/m LA Vol (A2C): 32.9 ml 15.46 ml/m  AORTIC VALVE LVOT Vmax:   101.00 cm/s LVOT Vmean:  85.000 cm/s LVOT VTI:    0.185 m  AORTA Ao Root diam: 2.90 cm MITRAL VALVE MV Area (PHT): 2.95 cm    SHUNTS MV Decel Time: 257 msec    Systemic VTI:  0.18 m MV E velocity: 78.00 cm/s  Systemic Diam: 2.20 cm Cherlynn Kaiser MD Electronically signed by Cherlynn Kaiser MD Signature Date/Time: 09/01/2020/11:02:05 AM    Final    Anti-infectives (From admission, onward)   Start     Dose/Rate Route Frequency Ordered Stop   09/01/20 1530  [MAR Hold]  piperacillin-tazobactam (ZOSYN) IVPB 3.375 g        (MAR Hold since Wed 09/01/2020 at 1620.Hold Reason: Transfer to a Procedural area.)   3.375 g 12.5 mL/hr over 240 Minutes Intravenous Every 8 hours 09/01/20 1514     09/01/20 1000  levofloxacin (LEVAQUIN) tablet 250 mg  Status:  Discontinued        250 mg Oral Daily 08/31/20 1508 08/31/20 1735   08/31/20 1730  cefTRIAXone (ROCEPHIN) 1 g in sodium chloride 0.9 % 100 mL IVPB  Status:  Discontinued        1  g 200 mL/hr over 30 Minutes Intravenous Every 24 hours 08/31/20 1726 09/01/20 1513   08/31/20 1730  [MAR Hold]  azithromycin (ZITHROMAX) 500 mg in sodium chloride 0.9 % 250 mL IVPB        (MAR Hold since Wed 09/01/2020 at 1620.Hold Reason: Transfer to a Procedural area.)   500 mg 250 mL/hr over 60 Minutes Intravenous Every 24 hours 08/31/20 1726 09/05/20 1729        Assessment/Plan HTN HLD DM Hx colon cancer s/p partial colectomy/colostomy followed by completion colectomy and ileostomy Hypoxia requiring 3L O2 Covid-19 pneumonia  Elevated D dimer on eliquis - last dose this AM, hold and reverse with andexxa A fib RVR  Pneumoperitoneum - Plan to take the patient urgently to the operating room for exploratory laparotomy. Keep NPO. Zosyn has been ordered. Will reverse eliquis with andexxa. Type and screen and check TEG.  Blood products ordered to be available in the OR if needed. Risks and benefits of the procedure have been discussed with the patient and her daughter including risks with anesthesia, MI, stroke, death, bleeding, infection, the need for multiple procedures, prolonged intubation postop. They wish to proceed. Code status confirmed and is full.  ID - zosyn 11/3 VTE - hold eliquis and reverse with andexxa FEN - IVF, NPO Foley - none Follow up - TBD Contact - daughter [name] [place], [facility] 09/01/2020, 4:05 PM Please see Amion for pager number during day hours 7:00am-4:30pm

## 2020-09-02 ENCOUNTER — Encounter (HOSPITAL_COMMUNITY): Payer: Self-pay | Admitting: Surgery

## 2020-09-02 DIAGNOSIS — J1282 Pneumonia due to coronavirus disease 2019: Secondary | ICD-10-CM | POA: Diagnosis not present

## 2020-09-02 DIAGNOSIS — R0902 Hypoxemia: Secondary | ICD-10-CM | POA: Diagnosis not present

## 2020-09-02 DIAGNOSIS — I4891 Unspecified atrial fibrillation: Secondary | ICD-10-CM | POA: Diagnosis not present

## 2020-09-02 DIAGNOSIS — U071 COVID-19: Secondary | ICD-10-CM | POA: Diagnosis not present

## 2020-09-02 LAB — CBC
HCT: 30.8 % — ABNORMAL LOW (ref 36.0–46.0)
Hemoglobin: 9.5 g/dL — ABNORMAL LOW (ref 12.0–15.0)
MCH: 22.8 pg — ABNORMAL LOW (ref 26.0–34.0)
MCHC: 30.8 g/dL (ref 30.0–36.0)
MCV: 74 fL — ABNORMAL LOW (ref 80.0–100.0)
Platelets: 280 10*3/uL (ref 150–400)
RBC: 4.16 MIL/uL (ref 3.87–5.11)
RDW: 15.3 % (ref 11.5–15.5)
WBC: 20.6 10*3/uL — ABNORMAL HIGH (ref 4.0–10.5)
nRBC: 0 % (ref 0.0–0.2)

## 2020-09-02 LAB — PREPARE PLATELET PHERESIS: Unit division: 0

## 2020-09-02 LAB — PREPARE CRYOPRECIPITATE: Unit division: 0

## 2020-09-02 LAB — COMPREHENSIVE METABOLIC PANEL
ALT: 14 U/L (ref 0–44)
AST: 17 U/L (ref 15–41)
Albumin: 1.9 g/dL — ABNORMAL LOW (ref 3.5–5.0)
Alkaline Phosphatase: 40 U/L (ref 38–126)
Anion gap: 12 (ref 5–15)
BUN: 42 mg/dL — ABNORMAL HIGH (ref 8–23)
CO2: 18 mmol/L — ABNORMAL LOW (ref 22–32)
Calcium: 8.8 mg/dL — ABNORMAL LOW (ref 8.9–10.3)
Chloride: 114 mmol/L — ABNORMAL HIGH (ref 98–111)
Creatinine, Ser: 1.53 mg/dL — ABNORMAL HIGH (ref 0.44–1.00)
GFR, Estimated: 37 mL/min — ABNORMAL LOW (ref >60)
Glucose, Bld: 242 mg/dL — ABNORMAL HIGH (ref 70–99)
Potassium: 4.2 mmol/L (ref 3.5–5.1)
Sodium: 144 mmol/L (ref 135–145)
Total Bilirubin: 0.6 mg/dL (ref 0.3–1.2)
Total Protein: 4.9 g/dL — ABNORMAL LOW (ref 6.5–8.1)

## 2020-09-02 LAB — BPAM PLATELET PHERESIS
Blood Product Expiration Date: 202111032359
ISSUE DATE / TIME: 202111020107
Unit Type and Rh: 5100

## 2020-09-02 LAB — BPAM CRYOPRECIPITATE
Blood Product Expiration Date: 202111032155
Unit Type and Rh: 6200

## 2020-09-02 LAB — GLUCOSE, CAPILLARY
Glucose-Capillary: 142 mg/dL — ABNORMAL HIGH (ref 70–99)
Glucose-Capillary: 164 mg/dL — ABNORMAL HIGH (ref 70–99)
Glucose-Capillary: 190 mg/dL — ABNORMAL HIGH (ref 70–99)
Glucose-Capillary: 206 mg/dL — ABNORMAL HIGH (ref 70–99)
Glucose-Capillary: 212 mg/dL — ABNORMAL HIGH (ref 70–99)

## 2020-09-02 LAB — D-DIMER, QUANTITATIVE: D-Dimer, Quant: 2.96 ug/mL-FEU — ABNORMAL HIGH (ref 0.00–0.50)

## 2020-09-02 LAB — C-REACTIVE PROTEIN: CRP: 12.4 mg/dL — ABNORMAL HIGH (ref <1.0)

## 2020-09-02 MED ORDER — PHENOL 1.4 % MT LIQD
1.0000 | OROMUCOSAL | Status: DC | PRN
  Filled 2020-09-02: qty 177

## 2020-09-02 MED ORDER — ENOXAPARIN SODIUM 100 MG/ML ~~LOC~~ SOLN
100.0000 mg | Freq: Two times a day (BID) | SUBCUTANEOUS | Status: DC
  Administered 2020-09-02 – 2020-09-07 (×11): 100 mg via SUBCUTANEOUS
  Filled 2020-09-02 (×11): qty 1

## 2020-09-02 MED ORDER — INSULIN ASPART 100 UNIT/ML ~~LOC~~ SOLN
0.0000 [IU] | SUBCUTANEOUS | Status: DC
  Administered 2020-09-02: 2 [IU] via SUBCUTANEOUS
  Administered 2020-09-02: 1 [IU] via SUBCUTANEOUS
  Administered 2020-09-03 (×4): 2 [IU] via SUBCUTANEOUS
  Administered 2020-09-03: 3 [IU] via SUBCUTANEOUS
  Administered 2020-09-03: 1 [IU] via SUBCUTANEOUS
  Administered 2020-09-04: 7 [IU] via SUBCUTANEOUS
  Administered 2020-09-04: 5 [IU] via SUBCUTANEOUS
  Administered 2020-09-04: 7 [IU] via SUBCUTANEOUS

## 2020-09-02 MED ORDER — SALINE SPRAY 0.65 % NA SOLN
1.0000 | NASAL | Status: DC | PRN
  Administered 2020-09-02: 1 via NASAL
  Filled 2020-09-02: qty 44

## 2020-09-02 MED ORDER — METHOCARBAMOL 1000 MG/10ML IJ SOLN
500.0000 mg | Freq: Four times a day (QID) | INTRAVENOUS | Status: DC | PRN
  Filled 2020-09-02: qty 5

## 2020-09-02 MED ORDER — ACETAMINOPHEN 10 MG/ML IV SOLN
1000.0000 mg | Freq: Four times a day (QID) | INTRAVENOUS | Status: AC
  Administered 2020-09-02 – 2020-09-03 (×4): 1000 mg via INTRAVENOUS
  Filled 2020-09-02 (×4): qty 100

## 2020-09-02 NOTE — Addendum Note (Signed)
Addendum  created 09/02/20 7116 by Valda Favia, CRNA   Order list changed

## 2020-09-02 NOTE — Progress Notes (Addendum)
PROGRESS NOTE                                                                                                                                                                                                             Patient Demographics:    Candace [name], is a 69 y.o. female, DOB - February 25, 1951, NLG:[id number]  Outpatient Primary MD for the patient is [name], [name], MD   Admit date - 08/31/2020   LOS - 1  No chief complaint on file.      Brief Narrative: Patient is a 69 y.o. female was recently hospitalized for COVID-19 pneumonia-and discharged on 2-3 L of home O2-presents with worsening shortness of breath-found to have A. fib with RVR, worsening hypoxia requiring up to 6 L of oxygen and subsequently admitted to the hospitalist service.  Post admission-patient developed severe upper abdominal pain-subsequent further imaging studies revealed perforated viscus-she underwent a laparotomy on 11/3 which showed a perforated duodenal ulcer.  See below for further details.  COVID-19 vaccinated status: Unvaccinated  Significant Events: 10/25-10/31>> hospitalization for COVID-19 pneumonia-discharged on 2-3 L of oxygen. 11/2>> Admit to [facility] for worsening hypoxemia requiring 6 L of oxygen, A. fib with RVR 11/3>> developed abdominal pain-CT abdomen positive for perforated viscus-underwent emergent laparotomy  Significant studies: 10/26>> bilateral lower extremity Doppler: No DVT. 10/27>> renal ultrasound: Cystic changes at the renal hila likely parapelvic cysts.  Hydronephrosis felt to be less likely. 11/2>>Chest x-ray: Persistent bilateral pulmonary infiltrates consistent with multifocal pneumonia 11/2>> CTA chest: No PE, areas of consolidation and groundglass density bilaterally. 11/3>> Echo: EF 70-75% 11/3>> CT abdomen/pelvis: Pneumoperitoneum/intermediate density free fluid in the abdomen-positive for bowel  perforation.  COVID-19 medications: Steroids: Resume 11/2>> Remdesivir: 10/25>> 10/29  Antibiotics: Rocephin: 11/2 x1 Zithromax: 11/12>> 11/3 Zosyn: 11/3>>  Microbiology data: 10/25 >>blood culture: No growth  Procedures: 11/12>> ex lap-modified Phillip Heal patch repair of perforated duodenal ulcer.  Consults: None  DVT prophylaxis: Place TED hose Start: 08/31/20 1711 Place TED hose Start: 08/31/20 1507 Started on therapeutic Lovenox on 11/4    Subjective:   Appears much more comfortable than yesterday-no major issues overnight.  Stable on just 2 L of oxygen.   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  Doubt bacterial pneumonia-procalcitonin  levels are normal-she is on Zosyn to cover her intra-abdominal issues.  We will slowly taper steroids down over the next few days-CRP elevated but this is likely from surgical issues rather than worsening COVID-19 information.  Fever: afebrile O2 requirements:  SpO2: 94 % O2 Flow Rate (L/min): 3 L/min   COVID-19 Labs: Recent Labs    08/31/20 1922 09/01/20 0718 09/02/20 0313  DDIMER  --  2.53* 2.96*  LDH 533*  --   --   CRP 9.1*  --  12.4*       Component Value Date/Time   BNP 51.0 08/29/2020 0323    Recent Labs  Lab 08/27/20 0038 08/28/20 0241 08/29/20 0323 09/01/20 0718  PROCALCITON 1.10 0.54 0.36 <0.10    Lab Results  Component Value Date   SARSCOV2NAA POSITIVE (A) 08/23/2020     Prone/Incentive Spirometry: encouraged incentive spirometry use 3-4/hour.  Paroxysmal atrial fibrillation with RVR: Remains in atrial fibrillation-rate controlled with Cardizem-oral metoprolol/Eliquis on hold as patient n.p.o.  After discussion with general surgery-have started SQ Lovenox.  CHA2DS2-VASc score of at least 3, echo with preserved EF.  Once stable for oral intake-we will attempt to slowly put her back on oral rate control  agents.  AKI on CKD stage IIIb: AKI hemodynamically mediated-improved after hydration and close to baseline.  Perforated duodenal ulcer s/p exploratory laparotomy on 11/3: Remains n.p.o.-NG tube/JP drain in place-General surgery following and directing care.  Remains on PPI twice daily.  Anemia: Likely secondary to acute/critical illness-follow.  HLD: Hold statin as patient n.p.o.  DM-2 (A1c 7.0 on 10/26) with uncontrolled hyperglycemia due to steroids: CBGs on the higher side-change SSI to every 4 scale-follow closely.   Recent Labs    09/01/20 2110 09/02/20 0744 09/02/20 1131  GLUCAP 200* 206* 212*    History of colon cancer-s/p colostomy  Obesity: Estimated body mass index is 33.42 kg/m as calculated from the following:   Height as of this encounter: 5\' 8"  (1.727 m).   Weight as of this encounter: 99.7 kg.    GI prophylaxis: PPI  ABG:    Component Value Date/Time   PHART 7.348 (L) 09/01/2020 1817   PCO2ART 34.7 09/01/2020 1817   PO2ART 53 (L) 09/01/2020 1817   HCO3 19.1 (L) 09/01/2020 1817   TCO2 20 (L) 09/01/2020 1817   ACIDBASEDEF 6.0 (H) 09/01/2020 1817   O2SAT 85.0 09/01/2020 1817    Vent Settings: N/A   Condition -Stable  Family Communication  :  Daughter [name] [phone]) updated over the phone 11/4  Code Status :  Full Code Diet :  Diet Order            Diet NPO time specified Except for: Ice Chips  Diet effective now                  Disposition Plan  :   Status is: Observation  The patient will require care spanning > 2 midnights and should be moved to inpatient because: Inpatient level of care appropriate due to severity of illness  Dispo: The patient is from: Home              Anticipated d/c is to: Home              Anticipated d/c date is: > 3 days              Patient currently is not medically stable to d/c.    Barriers to discharge: Hypoxia requiring O2 supplementation-perforated duodenal ulcer-s/p exploratory  laparotomy on 11/3-remains n.p.o. with NG tube/JP drains in place.  Antimicorbials  :    Anti-infectives (From admission, onward)   Start     Dose/Rate Route Frequency Ordered Stop   09/02/20 1600  cefTRIAXone (ROCEPHIN) 1 g in sodium chloride 0.9 % 100 mL IVPB  Status:  Discontinued        1 g 200 mL/hr over 30 Minutes Intravenous Every 24 hours 09/01/20 1811 09/02/20 0838   09/01/20 1800  fluconazole (DIFLUCAN) IVPB 400 mg        400 mg 50 mL/hr over 240 Minutes Intravenous  Once 09/01/20 1749 09/02/20 0603   09/01/20 1530  piperacillin-tazobactam (ZOSYN) IVPB 3.375 g        3.375 g 12.5 mL/hr over 240 Minutes Intravenous Every 8 hours 09/01/20 1514 09/05/20 2359   09/01/20 1000  levofloxacin (LEVAQUIN) tablet 250 mg  Status:  Discontinued        250 mg Oral Daily 08/31/20 1508 08/31/20 1735   08/31/20 1730  cefTRIAXone (ROCEPHIN) 1 g in sodium chloride 0.9 % 100 mL IVPB  Status:  Discontinued        1 g 200 mL/hr over 30 Minutes Intravenous Every 24 hours 08/31/20 1726 09/01/20 1513   08/31/20 1730  azithromycin (ZITHROMAX) 500 mg in sodium chloride 0.9 % 250 mL IVPB  Status:  Discontinued        500 mg 250 mL/hr over 60 Minutes Intravenous Every 24 hours 08/31/20 1726 09/02/20 0838      Inpatient Medications  Scheduled Meds: . sodium chloride   Intravenous Once  . Chlorhexidine Gluconate Cloth  6 each Topical Daily  . enoxaparin (LOVENOX) injection  100 mg Subcutaneous Q12H  . insulin aspart  0-20 Units Subcutaneous TID WC  . insulin aspart  4 Units Subcutaneous TID WC  . insulin detemir  10 Units Subcutaneous BID  . methylPREDNISolone (SOLU-MEDROL) injection  50 mg Intravenous Q12H  . metoprolol tartrate  25 mg Per Tube BID  . pantoprazole (PROTONIX) IV  40 mg Intravenous Q12H  . rosuvastatin  40 mg Per Tube Daily  . sodium bicarbonate  650 mg Per Tube BID  . zinc sulfate  220 mg Per Tube Daily   Continuous Infusions: . acetaminophen 1,000 mg (09/02/20 1154)  .  diltiazem (CARDIZEM) infusion 7.5 mg/hr (09/02/20 1038)  . lactated ringers 75 mL/hr at 09/02/20 1020  . methocarbamol (ROBAXIN) IV    . piperacillin-tazobactam (ZOSYN)  IV 3.375 g (09/02/20 1021)   PRN Meds:.albuterol, alum & mag hydroxide-simeth, methocarbamol (ROBAXIN) IV, metoprolol tartrate, morphine injection, phenol   Time Spent in minutes  35    See all Orders from today for further details   Oren Binet M.D on 09/02/2020 at 1:25 PM  To page go to [email or web address] - use universal password  [facility] -  Office  [phone]    Objective:   Vitals:   09/01/20 2318 09/02/20 0428 09/02/20 0746 09/02/20 1134  BP: 105/60 (!) 108/51 (!) 109/49 (!) 81/55  Pulse: 85 87 90 94  Resp: 15 16 20 20   Temp: (!) 97.5 F (36.4 C) 97.7 F (36.5 C) 97.6 F (36.4 C) 97.9 F (36.6 C)  TempSrc: Oral Oral Oral Oral  SpO2: 98% 99% 97% 94%  Weight:      Height:        Wt Readings from Last 3 Encounters:  08/31/20 99.7 kg  08/23/20 99.8 kg     Intake/Output Summary (Last 24 hours) at 09/02/2020 1325 Last data  filed at 09/02/2020 1023 Gross per 24 hour  Intake 1845 ml  Output 1325 ml  Net 520 ml     Physical Exam Gen Exam:Alert awake-not in any distress HEENT:atraumatic, normocephalic Chest: B/L clear to auscultation anteriorly CVS:S1S2 regular Abdomen: Soft-some tenderness in the upper abdominal area without any peritoneal signs.  Colostomy in place-brown stools present. Extremities:no edema Neurology: Non focal Skin: no rash   Data Review:    CBC Recent Labs  Lab 08/27/20 0038 08/27/20 0038 08/28/20 0241 08/28/20 0241 08/29/20 0323 08/29/20 0323 08/31/20 1315 09/01/20 0043 09/01/20 1643 09/01/20 1817 09/02/20 0313  WBC 11.3*   < > 10.8*  --  12.8*  --  14.5* 11.6*  --   --  20.6*  HGB 12.9   < > 13.0   < > 13.6   < > 13.4 12.1 12.6 10.5* 9.5*  HCT 40.3   < > 41.2   < > 43.6   < > 42.4 39.5 37.0 31.0* 30.8*  PLT 335   < > 308  --  366  --  365  324  --   --  280  MCV 70.7*   < > 72.0*  --  73.6*  --  73.2* 76.1*  --   --  74.0*  MCH 22.6*   < > 22.7*  --  23.0*  --  23.1* 23.3*  --   --  22.8*  MCHC 32.0   < > 31.6  --  31.2  --  31.6 30.6  --   --  30.8  RDW 15.4   < > 15.3  --  15.4  --  15.1 15.3  --   --  15.3  LYMPHSABS 0.4*  --  0.4*  --  0.4*  --  0.4*  --   --   --   --   MONOABS 1.7*  --  1.5*  --  1.4*  --  1.2*  --   --   --   --   EOSABS 0.0  --  0.0  --  0.0  --  0.0  --   --   --   --   BASOSABS 0.0  --  0.0  --  0.0  --  0.0  --   --   --   --    < > = values in this interval not displayed.    Chemistries  Recent Labs  Lab 08/27/20 0038 08/27/20 0038 08/28/20 0241 08/28/20 0241 08/29/20 0323 08/29/20 0323 08/31/20 1315 09/01/20 0043 09/01/20 1643 09/01/20 1817 09/02/20 0313  NA 146*   < > 143   < > 143   < > 136 142 140 143 144  K 4.3   < > 4.4   < > 4.7   < > 4.9 5.1 3.7 3.7 4.2  CL 112*   < > 109  --  108  --  102 110  --   --  114*  CO2 23   < > 23  --  25  --  19* 19*  --   --  18*  GLUCOSE 154*   < > 216*  --  269*  --  316* 312*  --   --  242*  BUN 81*   < > 64*  --  56*  --  60* 45*  --   --  42*  CREATININE 1.53*   < > 1.38*  --  1.45*  --  1.69* 1.51*  --   --  1.53*  CALCIUM 10.7*   < > 10.3  --  10.4*  --  10.1 9.4  --   --  8.8*  MG 2.0  --  1.8  --  1.7  --  1.8  --   --   --   --   AST 23  --  26  --  29  --  29  --   --   --  17  ALT 18  --  21  --  25  --  21  --   --   --  14  ALKPHOS 53  --  55  --  59  --  57  --   --   --  40  BILITOT 0.5  --  0.3  --  0.6  --  1.2  --   --   --  0.6   < > = values in this interval not displayed.   ------------------------------------------------------------------------------------------------------------------ No results for input(s): CHOL, HDL, LDLCALC, TRIG, CHOLHDL, LDLDIRECT in the last 72 hours.  Lab Results  Component Value Date   HGBA1C 7.0 (H) 08/24/2020    ------------------------------------------------------------------------------------------------------------------ Recent Labs    08/31/20 1220  TSH 0.015*   ------------------------------------------------------------------------------------------------------------------ No results for input(s): VITAMINB12, FOLATE, FERRITIN, TIBC, IRON, RETICCTPCT in the last 72 hours.  Coagulation profile No results for input(s): INR, PROTIME in the last 168 hours.  Recent Labs    09/01/20 0718 09/02/20 0313  DDIMER 2.53* 2.96*    Cardiac Enzymes No results for input(s): CKMB, TROPONINI, MYOGLOBIN in the last 168 hours.  Invalid input(s): CK ------------------------------------------------------------------------------------------------------------------    Component Value Date/Time   BNP 51.0 08/29/2020 0323    Micro Results Recent Results (from the past 240 hour(s))  Resp Panel by RT PCR (RSV, Flu A&B, Covid) - Nasopharyngeal Swab     Status: Abnormal   Collection Time: 08/23/20  2:36 PM   Specimen: Nasopharyngeal Swab  Result Value Ref Range Status   SARS Coronavirus 2 by RT PCR POSITIVE (A) NEGATIVE Final    Comment: RESULT CALLED TO, READ BACK BY AND VERIFIED WITH: A COLEMAN RN 08/23/20 AT 1740 SK (NOTE) SARS-CoV-2 target nucleic acids are DETECTED.  SARS-CoV-2 RNA is generally detectable in upper respiratory specimens  during the acute phase of infection. Positive results are indicative of the presence of the identified virus, but do not rule out bacterial infection or co-infection with other pathogens not detected by the test. Clinical correlation with patient history and other diagnostic information is necessary to determine patient infection status. The expected result is Negative.  Fact Sheet for Patients:  PinkCheek.be  Fact Sheet for Healthcare Providers: GravelBags.it  This test is not yet approved or  cleared by the [place] FDA and  has been authorized for detection and/or diagnosis of SARS-CoV-2 by FDA under an Emergency Use Authorization (EUA).  This EUA will remain in effect (meaning this test can be [place] ed) for the duration of  the COVID-19 declaration under Section 564(b)(1) of the Act, 21 U.S.C. section 360bbb-3(b)(1), unless the authorization is terminated or revoked sooner.      Influenza A by PCR NEGATIVE NEGATIVE Final   Influenza B by PCR NEGATIVE NEGATIVE Final    Comment: (NOTE) The Xpert Xpress SARS-CoV-2/FLU/RSV assay is intended as an aid in  the diagnosis of influenza from Nasopharyngeal swab specimens and  should not be used as a sole basis for treatment. Nasal washings and  aspirates are unacceptable for Xpert Xpress SARS-CoV-2/FLU/RSV  testing.  Fact Sheet for Patients: PinkCheek.be  Fact Sheet for Healthcare Providers: GravelBags.it  This test is not yet approved or cleared by the [place] FDA and  has been authorized for detection and/or diagnosis of SARS-CoV-2 by  FDA under an Emergency Use Authorization (EUA). This EUA will remain  in effect (meaning this test can be used) for the duration of the  Covid-19 declaration under Section 564(b)(1) of the Act, 21  U.S.C. section 360bbb-3(b)(1), unless the authorization is  terminated or revoked.    Respiratory Syncytial Virus by PCR NEGATIVE NEGATIVE Final    Comment: (NOTE) Fact Sheet for Patients: PinkCheek.be  Fact Sheet for Healthcare Providers: GravelBags.it  This test is not yet approved or cleared by the [place] FDA and  has been authorized for detection and/or diagnosis of SARS-CoV-2 by  FDA under an Emergency Use Authorization (EUA). This EUA will remain  in effect (meaning this test can be used) for the duration of the  COVID-19 declaration under Section 564(b)(1)  of the Act, 21 U.S.C.  section 360bbb-3(b)(1), unless the authorization is terminated or  revoked. Performed at [facility], [place] [address] [place], [place] [address]   MRSA PCR Screening     Status: None   Collection Time: 08/24/20  8:50 AM   Specimen: Nasal Mucosa; Nasopharyngeal  Result Value Ref Range Status   MRSA by PCR NEGATIVE NEGATIVE Final    Comment:        The GeneXpert MRSA Assay (FDA approved for NASAL specimens only), is one component of a comprehensive MRSA colonization surveillance program. It is not intended to diagnose MRSA infection nor to guide or monitor treatment for MRSA infections. Performed at [facility], [place] [address], [place], [place] [address]     Radiology Reports CT ABDOMEN PELVIS WO CONTRAST  Addendum Date: 09/01/2020   ADDENDUM REPORT: 09/01/2020 14:53 ADDENDUM: Critical Value/emergent results were called by telephone at the time of interpretation on 09/01/2020 at 1447 hours to Dr. Oren Binet , who verbally acknowledged these results. Electronically Signed   By: Genevie Ann M.D.   On: 09/01/2020 14:53   Result Date: 09/01/2020 CLINICAL DATA:  69 year old female with abdominal pain. COVID-19. History of colon cancer status post resection. EXAM: CT ABDOMEN AND PELVIS WITHOUT CONTRAST TECHNIQUE: Multidetector CT imaging of the abdomen and pelvis was performed following the standard protocol without IV contrast. COMPARISON:  CTA chest yesterday.  CT Abdomen and Pelvis 06/07/2014. FINDINGS: Lower chest: Extensive lower lung pneumonia. No pericardial or pleural effusion. Hepatobiliary: Pneumoperitoneum and intermediate density free fluid adjacent to the liver. Vicarious contrast excretion to the gallbladder. Grossly normal liver parenchyma. Pancreas: Negative noncontrast pancreas. Spleen: Negative. Adrenals/Urinary Tract: Stable low-density bilateral adrenal adenomas since 2015. Chronic renal parapelvic cysts redemonstrated. No definite  hydronephrosis. No acute perinephric stranding. Both ureters are decompressed. Excreted IV contrast in the urinary bladder. Stomach/Bowel: Left lower quadrant ostomy seen in 2015 has been taken down, along with completion total colectomy since that time. 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 There is abnormal free air and intermediate density free fluid in the upper abdomen. Free air continues in the ventral small bowel mesentery toward the pelvis. And free air tracks along the right ileostomy loop. Oral contrast was administered, and has not leaked from the stomach, duodenum, or proximal jejunum. No abnormally dilated loops. And the specific site of bowel perforation is unclear. There are several small bowel loops which might be adhered to the ventral abdominal  wall abutting the greater curve of the stomach. Vascular/Lymphatic: Vascular patency is not evaluated in the absence of IV contrast. Mild Calcified aortic atherosclerosis. Normal caliber abdominal aorta. No lymphadenopathy. Reproductive: Retroverted uterus suspected, with surgically absent rectum. Other: Moderate pelvic free fluid with simple fluid density, similar to the 2015 CT. Distal colon appear surgically absent. Musculoskeletal: Advanced disc and endplate degeneration throughout the visible spine. No acute or suspicious osseous lesion identified. IMPRESSION: 1. Positive for bowel perforation: Pneumoperitoneum and intermediate density free fluid in the abdomen. Prior total colectomy. The specific site of perforation is unclear-oral contrast present to the proximal jejunum has not obviously leaked. Note that there may be small bowel loops adherent to the ventral abdominal wall along the greater curve of the stomach. 2. Extensive bilateral lower lung pneumonia. No pleural effusion. 3. Other abdominal and pelvic viscera are stable since 2015, including bilateral adrenal adenomas. Chronic renal parapelvic cysts. 4. Aortic Atherosclerosis (ICD10-I70.0).  Electronically Signed: By: Genevie Ann M.D. On: 09/01/2020 14:40   CT Angio Chest PE W and/or Wo Contrast  Result Date: 08/31/2020 CLINICAL DATA:  Hypoxia, positive COVID EXAM: CT ANGIOGRAPHY CHEST WITH CONTRAST TECHNIQUE: Multidetector CT imaging of the chest was performed using the standard protocol during bolus administration of intravenous contrast. Multiplanar CT image reconstructions and MIPs were obtained to evaluate the vascular anatomy. CONTRAST:  41mL OMNIPAQUE IOHEXOL 350 MG/ML SOLN COMPARISON:  None. FINDINGS: Cardiovascular: Satisfactory opacification of the pulmonary arteries to the segmental level. No evidence of pulmonary embolism. Normal heart size. No pericardial effusion. Mediastinum/Nodes: No enlarged lymph nodes. Lungs/Pleura: Bilateral areas of consolidation and ground-glass density with a lower lobe predominance. No pleural effusion. No pneumothorax. Upper Abdomen: Partially imaged left adrenal nodule was present on 2015 abdominal CT. Right adrenal nodule was also present in 2015. Musculoskeletal: Degenerative changes of the included spine. Review of the MIP images confirms the above findings. IMPRESSION: No evidence of acute pulmonary embolism. COVID-19 pneumonia. Electronically Signed   By: Macy Mis M.D.   On: 08/31/2020 16:19   US RENAL  Result Date: 08/25/2020 CLINICAL DATA:  Acute kidney injury. EXAM: RENAL / URINARY TRACT ULTRASOUND COMPLETE COMPARISON:  Abdominal CT 06/07/2014 FINDINGS: Right Kidney: Renal measurements: 10.7 x 5.2 x 5.0 cm = volume: 146 mL. Cystic changes at the renal hila likely parapelvic cysts as seen on prior CT. Normal renal parenchymal echogenicity. No shadowing calculus. No solid renal mass. Left Kidney: Renal measurements: 10.7 x 6.2 x 5.7 cm = volume: 197 mL. Cystic changes at the renal hila likely parapelvic cyst is seen on prior CT. Normal renal parenchymal echogenicity. No shadowing calculus. No solid renal mass. Bladder: Appears normal for degree  of bladder distention. Both ureteral jets are seen. Other: None. IMPRESSION: Cystic changes at the renal hila likely parapelvic cysts as seen on prior CT. Mild bilateral hydronephrosis is felt less likely as both ureteral jets are seen in the bladder. Electronically Signed   By: Keith Rake M.D.   On: 08/25/2020 01:55   DG Chest Portable 1 View  Result Date: 08/31/2020 CLINICAL DATA:  Dyspnea at home health check up this morning, had COVID-19 diagnosis 10 days prior, recent discharge, new hypoxia EXAM: PORTABLE CHEST 1 VIEW COMPARISON:  Portable exam 1237 hours compared to 08/27/2020 FINDINGS: Normal heart size, mediastinal contours, and pulmonary vascularity. Atherosclerotic calcification aorta. Persistent infiltrates identified in the mid to lower lungs bilaterally consistent with multifocal pneumonia and COVID-19. Appearance unchanged from previous exam. No pleural effusion or pneumothorax. IMPRESSION: Persistent BILATERAL  pulmonary infiltrates consistent with multifocal pneumonia and COVID-19. Electronically Signed   By: Lavonia Dana M.D.   On: 08/31/2020 12:45   DG Chest Port 1 View  Result Date: 08/27/2020 CLINICAL DATA:  Shortness of breath EXAM: PORTABLE CHEST 1 VIEW COMPARISON:  08/25/2020 FINDINGS: No significant interval change in AP portable examination with heterogeneous bibasilar airspace opacity and possible small layering pleural effusions. No new airspace opacity. Cardiomegaly. IMPRESSION: No significant interval change in AP portable examination with heterogeneous bibasilar airspace opacity and possible small layering pleural effusions, consistent with multifocal infection. No new airspace opacity. Electronically Signed   By: Eddie Candle M.D.   On: 08/27/2020 11:04   DG Chest Port 1 View  Result Date: 08/25/2020 CLINICAL DATA:  Shortness of breath.  COVID-19 positive. EXAM: PORTABLE CHEST 1 VIEW COMPARISON:  August 23, 2020. FINDINGS: Stable cardiomediastinal silhouette. No  pneumothorax is noted. Stable bilateral lung opacities are noted concerning for multifocal pneumonia. Bony thorax is unremarkable. IMPRESSION: Stable bilateral lung opacities are noted concerning for multifocal pneumonia. Electronically Signed   By: Marijo Conception M.D.   On: 08/25/2020 09:36   DG Chest Port 1 View  Result Date: 08/23/2020 CLINICAL DATA:  Shortness of breath EXAM: PORTABLE CHEST 1 VIEW COMPARISON:  None. FINDINGS: There is ill-defined airspace opacity in portions of each mid and lower lung region. Heart size and pulmonary vascularity are normal. No adenopathy. There is degenerative change in the left shoulder. IMPRESSION: Ill-defined airspace opacity in the mid and lower lung regions, most indicative of multifocal pneumonia. Question atypical organism pneumonia given this appearance. Check of COVID-19 status in this regard advised. Heart size within normal limits.  No adenopathy appreciable. Electronically Signed   By: Lowella Grip III M.D.   On: 08/23/2020 11:33   ECHOCARDIOGRAM COMPLETE  Result Date: 09/01/2020    ECHOCARDIOGRAM REPORT   Patient Name:   HALSEY PERSAUD Date of Exam: 09/01/2020 Medical Rec #:  [id number]    Height:       68.0 in Accession #:    [id number]   Weight:       219.8 lb Date of Birth:  [birth year]    BSA:          2.127 m Patient Age:    17 years     BP:           103/57 mmHg Patient Gender: F            HR:           77 bpm. Exam Location:  Inpatient Procedure: 2D Echo Indications:    atrial fibrillation 427.31  History:        Patient has no prior history of Echocardiogram examinations.                 Risk Factors:Hypertension, Diabetes and Dyslipidemia. Covid +.  Sonographer:    Jannett Celestine RDCS (AE) Referring Phys: [id number] AMY N [facility]  Sonographer Comments: Technically difficult study due to poor echo windows. Restricted mobility. IMPRESSIONS  1. Left ventricular ejection fraction, by estimation, is 70 to 75%. The left ventricle has hyperdynamic function.  Left ventricular endocardial border not optimally defined to evaluate regional wall motion, but appears grossly normal. There is mild left ventricular hypertrophy.  2. RV is not well visualized due to image quality. Right ventricular systolic function is mildly reduced. The right ventricular size is mildly enlarged. TR signal inadequate for assessing RVSP.  3. The mitral valve is normal in structure. Trivial  mitral valve regurgitation. No evidence of mitral stenosis.  4. The aortic valve is tricuspid. Aortic valve regurgitation is not visualized. No aortic stenosis is present.  5. The inferior vena cava is normal in size with greater than 50% respiratory variability, suggesting right atrial pressure of 3 mmHg. FINDINGS  Left Ventricle: Midcavitary gradient 17.5 mmHg, Vmax 2 m/s. Left ventricular ejection fraction, by estimation, is 70 to 75%. The left ventricle has hyperdynamic function. Left ventricular endocardial border not optimally defined to evaluate regional wall motion. The left ventricular internal cavity size was normal in size. There is mild left ventricular hypertrophy. Left ventricular diastolic parameters are indeterminate. Right Ventricle: RV is not well visualized. The right ventricular size is mildly enlarged. No increase in right ventricular wall thickness. Right ventricular systolic function is mildly reduced. Tricuspid regurgitation signal is inadequate for assessing PA pressure. Left Atrium: Left atrial size was normal in size. Right Atrium: Right atrial size was normal in size. Pericardium: There is no evidence of pericardial effusion. Mitral Valve: The mitral valve is normal in structure. Trivial mitral valve regurgitation. No evidence of mitral valve stenosis. Tricuspid Valve: The tricuspid valve is normal in structure. Tricuspid valve regurgitation is not demonstrated. No evidence of tricuspid stenosis. Aortic Valve: The aortic valve is tricuspid. Aortic valve regurgitation is not  visualized. No aortic stenosis is present. Pulmonic Valve: The pulmonic valve was grossly normal. Pulmonic valve regurgitation is trivial. No evidence of pulmonic stenosis. Aorta: The aortic root is normal in size and structure. Venous: The inferior vena cava is normal in size with greater than 50% respiratory variability, suggesting right atrial pressure of 3 mmHg. IAS/Shunts: No atrial level shunt detected by color flow Doppler.  LEFT VENTRICLE PLAX 2D LVIDd:         3.45 cm LVIDs:         2.30 cm LV PW:         1.20 cm LV IVS:        1.15 cm LVOT diam:     2.20 cm LV SV:         70 LV SV Index:   33 LVOT Area:     3.80 cm  LEFT ATRIUM           Index LA diam:      3.20 cm 1.50 cm/m LA Vol (A2C): 32.9 ml 15.46 ml/m  AORTIC VALVE LVOT Vmax:   101.00 cm/s LVOT Vmean:  85.000 cm/s LVOT VTI:    0.185 m  AORTA Ao Root diam: 2.90 cm MITRAL VALVE MV Area (PHT): 2.95 cm    SHUNTS MV Decel Time: 257 msec    Systemic VTI:  0.18 m MV E velocity: 78.00 cm/s  Systemic Diam: 2.20 cm Cherlynn Kaiser MD Electronically signed by Cherlynn Kaiser MD Signature Date/Time: 09/01/2020/11:02:05 AM    Final    VAS [place] LOWER EXTREMITY VENOUS (DVT)  Result Date: 08/24/2020  Lower Venous DVTStudy Indications: Elevated d-dimer, covid.  Comparison Study: No prior studies. Performing Technologist: Darlin Coco  Examination Guidelines: A complete evaluation includes B-mode imaging, spectral Doppler, color Doppler, and power Doppler as needed of all accessible portions of each vessel. Bilateral testing is considered an integral part of a complete examination. Limited examinations for reoccurring indications may be performed as noted. The reflux portion of the exam is performed with the patient in reverse Trendelenburg.  +---------+---------------+---------+-----------+----------+--------------+ RIGHT    CompressibilityPhasicitySpontaneityPropertiesThrombus Aging  +---------+---------------+---------+-----------+----------+--------------+ CFV      Full  Yes      Yes                                 +---------+---------------+---------+-----------+----------+--------------+ SFJ      Full                                                        +---------+---------------+---------+-----------+----------+--------------+ FV Prox  Full                                                        +---------+---------------+---------+-----------+----------+--------------+ FV Mid   Full                                                        +---------+---------------+---------+-----------+----------+--------------+ FV DistalFull                                                        +---------+---------------+---------+-----------+----------+--------------+ PFV      Full                                                        +---------+---------------+---------+-----------+----------+--------------+ POP      Full           Yes      Yes                                 +---------+---------------+---------+-----------+----------+--------------+ PTV      Full                                                        +---------+---------------+---------+-----------+----------+--------------+ PERO     Full                                                        +---------+---------------+---------+-----------+----------+--------------+   +---------+---------------+---------+-----------+----------+--------------+ LEFT     CompressibilityPhasicitySpontaneityPropertiesThrombus Aging +---------+---------------+---------+-----------+----------+--------------+ CFV      Full           Yes      Yes                                 +---------+---------------+---------+-----------+----------+--------------+ SFJ  Full                                                         +---------+---------------+---------+-----------+----------+--------------+ FV Prox  Full                                                        +---------+---------------+---------+-----------+----------+--------------+ FV Mid   Full                                                        +---------+---------------+---------+-----------+----------+--------------+ FV DistalFull                                                        +---------+---------------+---------+-----------+----------+--------------+ PFV      Full                                                        +---------+---------------+---------+-----------+----------+--------------+ POP      Full           Yes      Yes                                 +---------+---------------+---------+-----------+----------+--------------+ PTV      Full                                                        +---------+---------------+---------+-----------+----------+--------------+ PERO     Full                                                        +---------+---------------+---------+-----------+----------+--------------+     Summary: RIGHT: - There is no evidence of deep vein thrombosis in the lower extremity.  - No cystic structure found in the popliteal fossa.  LEFT: - There is no evidence of deep vein thrombosis in the lower extremity.  - No cystic structure found in the popliteal fossa.  *See table(s) above for measurements and observations. Electronically signed by Deitra Mayo MD on 08/24/2020 at 5:36:23 PM.    Final

## 2020-09-02 NOTE — Addendum Note (Signed)
Addendum  created 09/02/20 1533 by Milford Cage, CRNA   Flowsheet accepted, Intraprocedure Flowsheets edited

## 2020-09-02 NOTE — Progress Notes (Signed)
[facility] Progress Note  1 Day Post-Op  Subjective: CC-  Awake and alert this morning. Abdomen sore, pain medication is starting to wear off. NG tube is clamped. Denies n/v. Some bilious output from ileostomy. WBC up 20.6, afebrile. Hgb 9.5 from 10.5. O2 sats upper 90's on 3L [place].  Objective: Vital signs in last 24 hours: Temp:  [97 F (36.1 C)-98.5 F (36.9 C)] 97.6 F (36.4 C) (11/04 0746) Pulse Rate:  [85-96] 90 (11/04 0746) Resp:  [15-28] 20 (11/04 0746) BP: (100-115)/(49-63) 109/49 (11/04 0746) SpO2:  [95 %-99 %] 97 % (11/04 0746) Last BM Date: 09/01/20  Intake/Output from previous day: 11/03 0701 - 11/04 0700 In: 2425 [P.O.:600; I.V.:1425; IV Piggyback:400] Out: 1325 [Urine:1100; Emesis/NG output:100; Drains:25; Blood:100] Intake/Output this shift: No intake/output data recorded.  PE: Gen:  Alert, NAD, pleasant HEENT: EOM's intact, pupils equal and round Card:  Irregular rhythm, HR 80-90s Pulm:  rate and effort normal on 3L [place] Abd: soft, appropriately tender on incision and drain, open midline incision with healthy pink tissue, drain with SS output GU: foley with clear yellow urine  Lab Results:  Recent Labs    09/01/20 0043 09/01/20 1643 09/01/20 1817 09/02/20 0313  WBC 11.6*  --   --  20.6*  HGB 12.1   < > 10.5* 9.5*  HCT 39.5   < > 31.0* 30.8*  PLT 324  --   --  280   < > = values in this interval not displayed.   BMET Recent Labs    09/01/20 0043 09/01/20 1643 09/01/20 1817 09/02/20 0313  NA 142   < > 143 144  K 5.1   < > 3.7 4.2  CL 110  --   --  114*  CO2 19*  --   --  18*  GLUCOSE 312*  --   --  242*  BUN 45*  --   --  42*  CREATININE 1.51*  --   --  1.53*  CALCIUM 9.4  --   --  8.8*   < > = values in this interval not displayed.   PT/INR No results for input(s): LABPROT, INR in the last 72 hours. CMP     Component Value Date/Time   NA 144 09/02/2020 0313   K 4.2 09/02/2020 0313   CL 114 (H) 09/02/2020 0313   CO2  18 (L) 09/02/2020 0313   GLUCOSE 242 (H) 09/02/2020 0313   BUN 42 (H) 09/02/2020 0313   CREATININE 1.53 (H) 09/02/2020 0313   CALCIUM 8.8 (L) 09/02/2020 0313   PROT 4.9 (L) 09/02/2020 0313   ALBUMIN 1.9 (L) 09/02/2020 0313   AST 17 09/02/2020 0313   ALT 14 09/02/2020 0313   ALKPHOS 40 09/02/2020 0313   BILITOT 0.6 09/02/2020 0313   GFRNONAA 37 (L) 09/02/2020 0313   Lipase     Component Value Date/Time   LIPASE 48 08/31/2020 2054       Studies/Results: CT ABDOMEN PELVIS WO CONTRAST  Addendum Date: 09/01/2020   ADDENDUM REPORT: 09/01/2020 14:53 ADDENDUM: Critical Value/emergent results were called by telephone at the time of interpretation on 09/01/2020 at 1447 hours to Dr. Oren Binet , who verbally acknowledged these results. Electronically Signed   By: Genevie Ann M.D.   On: 09/01/2020 14:53   Result Date: 09/01/2020 CLINICAL DATA:  69 year old female with abdominal pain. COVID-19. History of colon cancer status post resection. EXAM: CT ABDOMEN AND PELVIS WITHOUT CONTRAST TECHNIQUE: Multidetector CT imaging of the abdomen and  pelvis was performed following the standard protocol without IV contrast. COMPARISON:  CTA chest yesterday.  CT Abdomen and Pelvis 06/07/2014. FINDINGS: Lower chest: Extensive lower lung pneumonia. No pericardial or pleural effusion. Hepatobiliary: Pneumoperitoneum and intermediate density free fluid adjacent to the liver. Vicarious contrast excretion to the gallbladder. Grossly normal liver parenchyma. Pancreas: Negative noncontrast pancreas. Spleen: Negative. Adrenals/Urinary Tract: Stable low-density bilateral adrenal adenomas since 2015. Chronic renal parapelvic cysts redemonstrated. No definite hydronephrosis. No acute perinephric stranding. Both ureters are decompressed. Excreted IV contrast in the urinary bladder. Stomach/Bowel: Left lower quadrant ostomy seen in 2015 has been taken down, along with completion total colectomy since that time. 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 There is abnormal free air and intermediate density free fluid in the upper abdomen. Free air continues in the ventral small bowel mesentery toward the pelvis. And free air tracks along the right ileostomy loop. Oral contrast was administered, and has not leaked from the stomach, duodenum, or proximal jejunum. No abnormally dilated loops. And the specific site of bowel perforation is unclear. There are several small bowel loops which might be adhered to the ventral abdominal wall abutting the greater curve of the stomach. Vascular/Lymphatic: Vascular patency is not evaluated in the absence of IV contrast. Mild Calcified aortic atherosclerosis. Normal caliber abdominal aorta. No lymphadenopathy. Reproductive: Retroverted uterus suspected, with surgically absent rectum. Other: Moderate pelvic free fluid with simple fluid density, similar to the 2015 CT. Distal colon appear surgically absent. Musculoskeletal: Advanced disc and endplate degeneration throughout the visible spine. No acute or suspicious osseous lesion identified. IMPRESSION: 1. Positive for bowel perforation: Pneumoperitoneum and intermediate density free fluid in the abdomen. Prior total colectomy. The specific site of perforation is unclear-oral contrast present to the proximal jejunum has not obviously leaked. Note that there may be small bowel loops adherent to the ventral abdominal wall along the greater curve of the stomach. 2. Extensive bilateral lower lung pneumonia. No pleural effusion. 3. Other abdominal and pelvic viscera are stable since 2015, including bilateral adrenal adenomas. Chronic renal parapelvic cysts. 4. Aortic Atherosclerosis (ICD10-I70.0). Electronically Signed: By: Genevie Ann M.D. On: 09/01/2020 14:40   CT Angio Chest PE W and/or Wo Contrast  Result Date: 08/31/2020 CLINICAL DATA:  Hypoxia, positive COVID EXAM: CT ANGIOGRAPHY CHEST WITH CONTRAST TECHNIQUE: Multidetector CT imaging of the chest was  performed using the standard protocol during bolus administration of intravenous contrast. Multiplanar CT image reconstructions and MIPs were obtained to evaluate the vascular anatomy. CONTRAST:  14mL OMNIPAQUE IOHEXOL 350 MG/ML SOLN COMPARISON:  None. FINDINGS: Cardiovascular: Satisfactory opacification of the pulmonary arteries to the segmental level. No evidence of pulmonary embolism. Normal heart size. No pericardial effusion. Mediastinum/Nodes: No enlarged lymph nodes. Lungs/Pleura: Bilateral areas of consolidation and ground-glass density with a lower lobe predominance. No pleural effusion. No pneumothorax. Upper Abdomen: Partially imaged left adrenal nodule was present on 2015 abdominal CT. Right adrenal nodule was also present in 2015. Musculoskeletal: Degenerative changes of the included spine. Review of the MIP images confirms the above findings. IMPRESSION: No evidence of acute pulmonary embolism. COVID-19 pneumonia. Electronically Signed   By: Macy Mis M.D.   On: 08/31/2020 16:19   DG Chest Portable 1 View  Result Date: 08/31/2020 CLINICAL DATA:  Dyspnea at home health check up this morning, had COVID-19 diagnosis 10 days prior, recent discharge, new hypoxia EXAM: PORTABLE CHEST 1 VIEW COMPARISON:  Portable exam 1237 hours compared to 08/27/2020 FINDINGS: Normal heart size, mediastinal contours, and pulmonary vascularity. Atherosclerotic  calcification aorta. Persistent infiltrates identified in the mid to lower lungs bilaterally consistent with multifocal pneumonia and COVID-19. Appearance unchanged from previous exam. No pleural effusion or pneumothorax. IMPRESSION: Persistent BILATERAL pulmonary infiltrates consistent with multifocal pneumonia and COVID-19. Electronically Signed   By: Lavonia Dana M.D.   On: 08/31/2020 12:45   ECHOCARDIOGRAM COMPLETE  Result Date: 09/01/2020    ECHOCARDIOGRAM REPORT   Patient Name:   Candace [name] Date of Exam: 09/01/2020 Medical Rec #:  [id number]    Height:        68.0 in Accession #:    [id number]   Weight:       219.8 lb Date of Birth:  Apr 23, 1951    BSA:          2.127 m Patient Age:    69 years     BP:           103/57 mmHg Patient Gender: F            HR:           77 bpm. Exam Location:  Inpatient Procedure: 2D Echo Indications:    atrial fibrillation 427.31  History:        Patient has no prior history of Echocardiogram examinations.                 Risk Factors:Hypertension, Diabetes and Dyslipidemia. Covid +.  Sonographer:    Jannett Celestine RDCS (AE) Referring Phys: [id number] AMY N COX  Sonographer Comments: Technically difficult study due to poor echo windows. Restricted mobility. IMPRESSIONS  1. Left ventricular ejection fraction, by estimation, is 70 to 75%. The left ventricle has hyperdynamic function. Left ventricular endocardial border not optimally defined to evaluate regional wall motion, but appears grossly normal. There is mild left ventricular hypertrophy.  2. RV is not well visualized due to image quality. Right ventricular systolic function is mildly reduced. The right ventricular size is mildly enlarged. TR signal inadequate for assessing RVSP.  3. The mitral valve is normal in structure. Trivial mitral valve regurgitation. No evidence of mitral stenosis.  4. The aortic valve is tricuspid. Aortic valve regurgitation is not visualized. No aortic stenosis is present.  5. The inferior vena cava is normal in size with greater than 50% respiratory variability, suggesting right atrial pressure of 3 mmHg. FINDINGS  Left Ventricle: Midcavitary gradient 17.5 mmHg, Vmax 2 m/s. Left ventricular ejection fraction, by estimation, is 70 to 75%. The left ventricle has hyperdynamic function. Left ventricular endocardial border not optimally defined to evaluate regional wall motion. The left ventricular internal cavity size was normal in size. There is mild left ventricular hypertrophy. Left ventricular diastolic parameters are indeterminate. Right Ventricle: RV is  not well visualized. The right ventricular size is mildly enlarged. No increase in right ventricular wall thickness. Right ventricular systolic function is mildly reduced. Tricuspid regurgitation signal is inadequate for assessing PA pressure. Left Atrium: Left atrial size was normal in size. Right Atrium: Right atrial size was normal in size. Pericardium: There is no evidence of pericardial effusion. Mitral Valve: The mitral valve is normal in structure. Trivial mitral valve regurgitation. No evidence of mitral valve stenosis. Tricuspid Valve: The tricuspid valve is normal in structure. Tricuspid valve regurgitation is not demonstrated. No evidence of tricuspid stenosis. Aortic Valve: The aortic valve is tricuspid. Aortic valve regurgitation is not visualized. No aortic stenosis is present. Pulmonic Valve: The pulmonic valve was grossly normal. Pulmonic valve regurgitation is trivial. No evidence of pulmonic stenosis. Aorta: The aortic  root is normal in size and structure. Venous: The inferior vena cava is normal in size with greater than 50% respiratory variability, suggesting right atrial pressure of 3 mmHg. IAS/Shunts: No atrial level shunt detected by color flow Doppler.  LEFT VENTRICLE PLAX 2D LVIDd:         3.45 cm LVIDs:         2.30 cm LV PW:         1.20 cm LV IVS:        1.15 cm LVOT diam:     2.20 cm LV SV:         70 LV SV Index:   33 LVOT Area:     3.80 cm  LEFT ATRIUM           Index LA diam:      3.20 cm 1.50 cm/m LA Vol (A2C): 32.9 ml 15.46 ml/m  AORTIC VALVE LVOT Vmax:   101.00 cm/s LVOT Vmean:  85.000 cm/s LVOT VTI:    0.185 m  AORTA Ao Root diam: 2.90 cm MITRAL VALVE MV Area (PHT): 2.95 cm    SHUNTS MV Decel Time: 257 msec    Systemic VTI:  0.18 m MV E velocity: 78.00 cm/s  Systemic Diam: 2.20 cm Cherlynn Kaiser MD Electronically signed by Cherlynn Kaiser MD Signature Date/Time: 09/01/2020/11:02:05 AM    Final     Anti-infectives: Anti-infectives (From admission, onward)   Start      Dose/Rate Route Frequency Ordered Stop   09/02/20 1600  cefTRIAXone (ROCEPHIN) 1 g in sodium chloride 0.9 % 100 mL IVPB        1 g 200 mL/hr over 30 Minutes Intravenous Every 24 hours 09/01/20 1811 09/05/20 1559   09/01/20 1800  fluconazole (DIFLUCAN) IVPB 400 mg        400 mg 50 mL/hr over 240 Minutes Intravenous  Once 09/01/20 1749 09/02/20 0603   09/01/20 1530  piperacillin-tazobactam (ZOSYN) IVPB 3.375 g        3.375 g 12.5 mL/hr over 240 Minutes Intravenous Every 8 hours 09/01/20 1514 09/05/20 2359   09/01/20 1000  levofloxacin (LEVAQUIN) tablet 250 mg  Status:  Discontinued        250 mg Oral Daily 08/31/20 1508 08/31/20 1735   08/31/20 1730  cefTRIAXone (ROCEPHIN) 1 g in sodium chloride 0.9 % 100 mL IVPB  Status:  Discontinued        1 g 200 mL/hr over 30 Minutes Intravenous Every 24 hours 08/31/20 1726 09/01/20 1513   08/31/20 1730  azithromycin (ZITHROMAX) 500 mg in sodium chloride 0.9 % 250 mL IVPB        500 mg 250 mL/hr over 60 Minutes Intravenous Every 24 hours 08/31/20 1726 09/05/20 1729       Assessment/Plan HTN HLD DM Hx colon cancer s/p partial colectomy/colostomy followed by completion colectomy and ileostomy Hypoxia requiring 3L O2 Covid-19 pneumonia  Elevated D dimer on eliquis - eliquis reversed with kcentra, hold eliquis A fib RVR  Pneumoperitoneum, perforated duodenal ulcer S/p exploratory laparotomy, adhesiolysis, modified graham patch repair of perforated duodenal ulcer 11/3 Dr. Bobbye Morton - POD#1 - Continue NPO/NGT to LIWS, nothing per NG - plan for UGI on POD#5 prior to removing NG or advancing diet - continue PPI BID - plan for 4 days IV zosyn - continue JP drain and monitor output - BID wet to dry dressing changes, may consider applying wound vac on Friday - needs to mobilize/OOB, PT/OT following - schedule IV tylenol and add IV  robaxin PRN for better pain control  ID - zosyn 11/3>>day#1/4 VTE - ok for IV heparin or therapeutic lovenox from  surgical standpoint FEN - IVF, NPO/NGT to LIWS Foley - placed 11/3, continue today Follow up - Dr. Chrystie Nose - daughter [name] [phone]   LOS: 1 day    Wellington Hampshire, [facility] 09/02/2020, 8:37 AM Please see Amion for pager number during day hours 7:00am-4:30pm

## 2020-09-02 NOTE — Progress Notes (Signed)
ANTICOAGULATION CONSULT NOTE - Follow Up Consult  Pharmacy Consult for Eliquis to Lovenox Indication: atrial fibrillation  No Known Allergies  Patient Measurements: Height: 5\' 8"  (172.7 cm) Weight: 99.7 kg (219 lb 12.8 oz) IBW/kg (Calculated) : 63.9  Vital Signs: Temp: 97.6 F (36.4 C) (11/04 0746) Temp Source: Oral (11/04 0746) BP: 109/49 (11/04 0746) Pulse Rate: 90 (11/04 0746)  Labs: Recent Labs    08/31/20 1315 08/31/20 1315 08/31/20 1922 08/31/20 2054 09/01/20 0043 09/01/20 0043 09/01/20 1643 09/01/20 1643 09/01/20 1817 09/02/20 0313  HGB 13.4   < >  --   --  12.1   < > 12.6   < > 10.5* 9.5*  HCT 42.4   < >  --   --  39.5   < > 37.0  --  31.0* 30.8*  PLT 365  --   --   --  324  --   --   --   --  280  CREATININE 1.69*  --   --   --  1.51*  --   --   --   --  1.53*  TROPONINIHS 45*  --  36* 50*  --   --   --   --   --   --    < > = values in this interval not displayed.    Estimated Creatinine Clearance: 42.8 mL/min (A) (by C-G formula based on SCr of 1.53 mg/dL (H)).  Assessment: 69 year old female with Afib.  On Eliquis on hospital admission reversed with Kcentra 11/3 for emergent repair of perforated duodenal ulcer.  Now to begin Lovenox as bridge until Eliquis is resumed.  Goal of Therapy:  Anti-Xa level 0.6-1 units/ml 4hrs after LMWH dose given Monitor platelets by anticoagulation protocol: Yes   Plan:  Lovenox 100 mg sq Q 12 hours Follow CBC  Thank you Anette Guarneri, PharmD 09/02/2020,9:35 AM

## 2020-09-02 NOTE — Evaluation (Signed)
Occupational Therapy Evaluation Patient Details Name: Candace [name] MRN: [id number] DOB: 1951/02/12 Today's Date: 09/02/2020    History of Present Illness  69 y.o. female with medical history significant for recent covid pna, obesity, colon cancer s/p colon resection with colostomy bag, HLD, NIDDM2, CKD3 presented to ED after her follow-up nurse advised her to present to ED for elevated HR. +afib, elevated troponins with demand ischemia,  CT scan is positive for bowel perforation with pneumomediastinum 11/03 exp lap with repair of perforated ulcer   Clinical Impression   PTA, pt with recent dc to home with daughter on 10/31 and was requiring assistance for ADLs and using RW for mobility. Prior to COVID diagnosis, pt was independent. Pt currently requiring Min A for UB ADLs, Max A for LB ADLs, and Mod A +2 for functional transfers. Pt presenting with poor strength, balance, and activity tolerance. Despite, fatigue and increased abdominal pain, pt agreeable and motivated to participate in therapy. Pt would benefit from further acute OT to facilitate safe dc. Recommend dc to SNF for further OT to optimize safety, independence with ADLs, and return to PLOF.   At rest, SpO2 91% on 2L.  Once at EOB, pt SpO2 dropping to 83% and RR 30-40s. Placed pt on 3L O2 and SpO2 elevating to >88%.  During transfer to recliner, SpO2 86% on 3L, HR 148, and RR 44.  Resting in recliner, SpO2 90s on 3L and then 2L. HR 102. RR 20s    Follow Up Recommendations  SNF;Other (comment) (Pending progress, may be able to dc to home with 24/7)    Equipment Recommendations  None recommended by OT    Recommendations for Other Services PT consult     Precautions / Restrictions Precautions Precautions: Fall Precaution Comments: baseline colostomy      Mobility Bed Mobility Overal bed mobility: Needs Assistance Bed Mobility: Rolling;Sidelying to Sit Rolling: Mod assist;+2 for physical assistance Sidelying to sit: Mod  assist;+2 for physical assistance;+2 for safety/equipment Supine to sit: Min assist Sit to supine: Min guard   General bed mobility comments: Mod A +2 to roll towards left side and then elevate trunk. Use of log roll to reduce abdominal pain    Transfers Overall transfer level: Needs assistance Equipment used: 2 person hand held assist Transfers: Sit to/from [facility] Sit to Stand: Mod assist;From elevated surface;+2 physical assistance Stand pivot transfers: Mod assist;+2 physical assistance;+2 safety/equipment      Lateral/Scoot Transfers: Min guard General transfer comment: Mod A +2 to power up into standing. Once in standing, pt bending at hips due to pain and unable to gain upright posture. Mod A +2 for maintaining balance and then guiding hips towards recliner.    Balance Overall balance assessment: Needs assistance Sitting-balance support: No upper extremity supported;Feet supported Sitting balance-Leahy Scale: Fair     Standing balance support: Bilateral upper extremity supported;During functional activity Standing balance-Leahy Scale: Poor Standing balance comment: Reliant on UE support                           ADL either performed or assessed with clinical judgement   ADL Overall ADL's : Needs assistance/impaired Eating/Feeding: NPO   Grooming: Supervision/safety;Set up;Sitting   Upper Body Bathing: Minimal assistance;Sitting   Lower Body Bathing: Maximal assistance;Sit to/from stand   Upper Body Dressing : Minimal assistance;Sitting   Lower Body Dressing: Maximal assistance;Sit to/from stand   Toilet Transfer: Moderate assistance;+2 for physical assistance;Stand-pivot (simulated to recliner)  Functional mobility during ADLs: Moderate assistance;+2 for physical assistance;+2 for safety/equipment General ADL Comments: Pt presenting with poor activity tolerance, strength, balance, and strength. Pt agreeable to therapy  despite fatigue and pain     Vision Baseline Vision/History: Wears glasses Patient Visual Report: No change from baseline       Perception     Praxis      Pertinent Vitals/Pain Pain Assessment: Faces Faces Pain Scale: Hurts even more Pain Location: abd Pain Descriptors / Indicators: Guarding;Operative site guarding Pain Intervention(s): Monitored during session;Limited activity within patient's tolerance;Repositioned     Hand Dominance     Extremity/Trunk Assessment Upper Extremity Assessment Upper Extremity Assessment: Overall WFL for tasks assessed;Generalized weakness   Lower Extremity Assessment Lower Extremity Assessment: Generalized weakness   Cervical / Trunk Assessment Cervical / Trunk Assessment: Other exceptions Cervical / Trunk Exceptions: colostomy   Communication Communication Communication: No difficulties   Cognition Arousal/Alertness: Awake/alert Behavior During Therapy: Flat affect Overall Cognitive Status: Impaired/Different from baseline Area of Impairment: Attention;Following commands;Safety/judgement;Awareness;Problem solving;Orientation                 Orientation Level: Time (not day +month) Current Attention Level: Sustained   Following Commands: Follows one step commands with increased time Safety/Judgement: Decreased awareness of safety;Decreased awareness of deficits Awareness: Intellectual Problem Solving: Slow processing;Decreased initiation;Requires verbal cues General Comments: This therapist having worked with pt at last admission, pt continues to present with decreased problem solving and awareness. Pt able to respond to questions more quickly. however, still tangiental in thoughts.    General Comments  At rest, SpO2 91% on 2L. Once at EOB, pt SpO2 dropping to 83% and RR 30-40s. Placed pt on 3L O2 and SpO2 elevating to >88%. During transfer to recliner, SpO2 86% on 3L, HR 148, and RR 44. Resting in recliner, SpO2 90s on 3L and  then 2L and HR 102    Exercises Exercises: Other exercises Other Exercises Other Exercises: Educated on use of IS with pt pulling only 250-500 ml x 5 reps. Difficulty attending to task   Shoulder Instructions      Home Living Family/patient expects to be discharged to:: Private residence Living Arrangements: Spouse/significant other;Children;Other relatives Available Help at Discharge: Family Type of Home: House Home Access: Stairs to enter [employer] of Steps: 4 Entrance Stairs-Rails: Right;Left Home Layout: One level     Bathroom Shower/Tub: Teacher, early years/pre: Standard     Home Equipment: Bedside commode;Walker - 2 wheels ("waiting for a wheelchair")          Prior Functioning/Environment Level of Independence: Independent        Comments: had recent admission- was requiring RW and assist to walk during this admission. Is independent at baseline        OT Problem List: Decreased strength;Decreased knowledge of use of DME or AE;Decreased activity tolerance;Cardiopulmonary status limiting activity;Impaired balance (sitting and/or standing);Decreased safety awareness;Decreased cognition      OT Treatment/Interventions: Self-care/ADL training;Therapeutic exercise;Patient/family education;Balance training;Energy conservation;Therapeutic activities;DME and/or AE instruction;Cognitive remediation/compensation    OT Goals(Current goals can be found in the care plan section) Acute Rehab OT Goals Patient Stated Goal: get better and go home OT Goal Formulation: With patient Time For Goal Achievement: 09/16/20 Potential to Achieve Goals: Good  OT Frequency: Min 3X/week   Barriers to D/C:            Co-evaluation PT/OT/SLP Co-Evaluation/Treatment: Yes Reason for Co-Treatment: For patient/therapist safety;To address functional/ADL transfers   OT goals addressed  during session: ADL's and self-care      AM-PAC OT "6 Clicks" Daily Activity      Outcome Measure Help from another person eating meals?: A Little Help from another person taking care of personal grooming?: A Little Help from another person toileting, which includes using toliet, bedpan, or urinal?: A Lot Help from another person bathing (including washing, rinsing, drying)?: A Lot Help from another person to put on and taking off regular upper body clothing?: A Little Help from another person to put on and taking off regular lower body clothing?: A Lot 6 Click Score: 15   End of Session Equipment Utilized During Treatment: Oxygen (2-3L) Nurse Communication: Mobility status  Activity Tolerance: Patient tolerated treatment well;Patient limited by fatigue Patient left: in chair;with call bell/phone within reach  OT Visit Diagnosis: Unsteadiness on feet (R26.81);Other abnormalities of gait and mobility (R26.89);Muscle weakness (generalized) (M62.81);Other symptoms and signs involving cognitive function                Time: 2947-6546 OT Time Calculation (min): 28 min Charges:  OT General Charges $OT Visit: 1 Visit OT Evaluation $OT Eval Moderate Complexity: [place], OTR/L Acute Rehab Pager: [phone] Office: [place] 09/02/2020, 12:00 PM

## 2020-09-02 NOTE — Evaluation (Signed)
Physical Therapy Evaluation Patient Details Name: Candace [name] MRN: [id number] DOB: Apr 04, 1951 Today's Date: 09/02/2020   History of Present Illness   69 y.o. female with medical history significant for recent covid pna, obesity, colon cancer s/p colon resection with colostomy bag, HLD, NIDDM2, CKD3 presented to ED after her follow-up nurse advised her to present to ED for elevated HR. +afib, elevated troponins with demand ischemia,  CT scan is positive for bowel perforation with pneumomediastinum 11/03 exp lap with repair of perforated ulcer  Clinical Impression   Pt admitted with above diagnosis and s/p abdominal surgery. Prior to recent hospitalization for COVID she was independent. She and family refused SNF on discharge. Currently, her confusion is improved however continue to feel she will need SNF unless family can provide 24/7 care.  Pt currently with functional limitations due to the deficits listed below (see PT Problem List). Pt will benefit from skilled PT to increase their independence and safety with mobility to allow discharge to the venue listed below.       Follow Up Recommendations SNF;Supervision/Assistance - 24 hour (pt/family may again refuse SNF)    Equipment Recommendations  None recommended by PT    Recommendations for Other Services       Precautions / Restrictions Precautions Precautions: Fall Precaution Comments: baseline colostomy      Mobility  Bed Mobility Overal bed mobility: Needs Assistance Bed Mobility: Rolling;Sidelying to Sit Rolling: Mod assist;+2 for physical assistance Sidelying to sit: Mod assist;+2 for physical assistance;+2 for safety/equipment       General bed mobility comments: Mod A +2 to roll towards left side and then elevate trunk. Use of log roll to reduce abdominal pain    Transfers Overall transfer level: Needs assistance Equipment used: 2 person hand held assist Transfers: Sit to/from [facility] Sit to  Stand: Mod assist;From elevated surface;+2 physical assistance Stand pivot transfers: Mod assist;+2 physical assistance;+2 safety/equipment       General transfer comment: Mod A +2 to power up into standing. Once in standing, pt bending at hips due to pain and unable to gain upright posture. Mod A +2 for maintaining balance and then guiding hips towards recliner.  Ambulation/Gait             General Gait Details: unable at this time  Stairs            Wheelchair Mobility    Modified Rankin (Stroke Patients Only)       Balance Overall balance assessment: Needs assistance Sitting-balance support: No upper extremity supported;Feet supported Sitting balance-Leahy Scale: Fair     Standing balance support: Bilateral upper extremity supported;During functional activity Standing balance-Leahy Scale: Poor Standing balance comment: Reliant on UE support                             Pertinent Vitals/Pain Pain Assessment: Faces Faces Pain Scale: Hurts even more Pain Location: abd Pain Descriptors / Indicators: Guarding;Operative site guarding Pain Intervention(s): Limited activity within patient's tolerance;Monitored during session    Home Living Family/patient expects to be discharged to:: Private residence Living Arrangements: Spouse/significant other;Children;Other relatives Available Help at Discharge: Family Type of Home: House Home Access: Stairs to enter Entrance Stairs-Rails: Psychiatric nurse of Steps: 4 Home Layout: One level Home Equipment: Bedside commode;Walker - 2 wheels ("waiting for a wheelchair")      Prior Function Level of Independence: Independent         Comments: had recent  admission- was requiring RW and assist to walk during this admission. Is independent at baseline     Hand Dominance        Extremity/Trunk Assessment   Upper Extremity Assessment Upper Extremity Assessment: Defer to OT evaluation     Lower Extremity Assessment Lower Extremity Assessment: Generalized weakness    Cervical / Trunk Assessment Cervical / Trunk Assessment: Other exceptions Cervical / Trunk Exceptions: colostomy; open wound with packing s/p exp lap  Communication   Communication: No difficulties  Cognition Arousal/Alertness: Awake/alert Behavior During Therapy: Flat affect Overall Cognitive Status: Impaired/Different from baseline Area of Impairment: Attention;Following commands;Safety/judgement;Awareness;Problem solving;Orientation                 Orientation Level: Time (not day +month) Current Attention Level: Sustained   Following Commands: Follows one step commands with increased time Safety/Judgement: Decreased awareness of safety;Decreased awareness of deficits Awareness: Intellectual Problem Solving: Slow processing;Decreased initiation;Requires verbal cues General Comments: pt continues to present with decreased problem solving and awareness. Pt able to respond to questions more quickly. however, still tangiental in thoughts.       General Comments      Exercises     Assessment/Plan    PT Assessment Patient needs continued PT services  PT Problem List Decreased strength;Decreased activity tolerance;Decreased balance;Decreased mobility;Decreased cognition;Decreased knowledge of use of DME;Cardiopulmonary status limiting activity;Obesity;Pain       PT Treatment Interventions DME instruction;Gait training;Functional mobility training;Therapeutic activities;Therapeutic exercise;Cognitive remediation;Patient/family education    PT Goals (Current goals can be found in the Care Plan section)  Acute Rehab PT Goals Patient Stated Goal: get better and go home PT Goal Formulation: With patient Time For Goal Achievement: 09/16/20 Potential to Achieve Goals: Good    Frequency Min 3X/week (unless confirm SNF)   Barriers to discharge        Co-evaluation PT/OT/SLP  Co-Evaluation/Treatment: Yes Reason for Co-Treatment: Complexity of the patient's impairments (multi-system involvement);To address functional/ADL transfers PT goals addressed during session: Mobility/safety with mobility;Balance         AM-PAC PT "6 Clicks" Mobility  Outcome Measure Help needed turning from your back to your side while in a flat bed without using bedrails?: A Lot Help needed moving from lying on your back to sitting on the side of a flat bed without using bedrails?: A Lot Help needed moving to and from a bed to a chair (including a wheelchair)?: A Lot Help needed standing up from a chair using your arms (e.g., wheelchair or bedside chair)?: A Lot Help needed to walk in hospital room?: Total Help needed climbing 3-5 steps with a railing? : Total 6 Click Score: 10    End of Session Equipment Utilized During Treatment: Oxygen Activity Tolerance: Patient limited by pain;Patient limited by fatigue Patient left: in chair;with call bell/phone within reach   PT Visit Diagnosis: Muscle weakness (generalized) (M62.81);Difficulty in walking, not elsewhere classified (R26.2);Pain Pain - part of body:  (abdomen)    Time: 2010-0712 PT Time Calculation (min) (ACUTE ONLY): 28 min   Charges:   PT Evaluation $PT Eval Low Complexity: 1 Low           Arby Barrette, PT Pager [phone]   Rexanne Mano 09/02/2020, 3:14 PM

## 2020-09-03 ENCOUNTER — Inpatient Hospital Stay: Payer: Self-pay

## 2020-09-03 ENCOUNTER — Inpatient Hospital Stay (HOSPITAL_COMMUNITY): Payer: Medicare Other

## 2020-09-03 DIAGNOSIS — I4891 Unspecified atrial fibrillation: Secondary | ICD-10-CM | POA: Diagnosis not present

## 2020-09-03 DIAGNOSIS — J1282 Pneumonia due to coronavirus disease 2019: Secondary | ICD-10-CM | POA: Diagnosis not present

## 2020-09-03 DIAGNOSIS — U071 COVID-19: Secondary | ICD-10-CM | POA: Diagnosis not present

## 2020-09-03 DIAGNOSIS — R0902 Hypoxemia: Secondary | ICD-10-CM | POA: Diagnosis not present

## 2020-09-03 LAB — CBC
HCT: 28.7 % — ABNORMAL LOW (ref 36.0–46.0)
Hemoglobin: 8.9 g/dL — ABNORMAL LOW (ref 12.0–15.0)
MCH: 23.1 pg — ABNORMAL LOW (ref 26.0–34.0)
MCHC: 31 g/dL (ref 30.0–36.0)
MCV: 74.5 fL — ABNORMAL LOW (ref 80.0–100.0)
Platelets: 255 10*3/uL (ref 150–400)
RBC: 3.85 MIL/uL — ABNORMAL LOW (ref 3.87–5.11)
RDW: 15.5 % (ref 11.5–15.5)
WBC: 18.4 10*3/uL — ABNORMAL HIGH (ref 4.0–10.5)
nRBC: 0 % (ref 0.0–0.2)

## 2020-09-03 LAB — D-DIMER, QUANTITATIVE: D-Dimer, Quant: 7.49 ug/mL-FEU — ABNORMAL HIGH (ref 0.00–0.50)

## 2020-09-03 LAB — COMPREHENSIVE METABOLIC PANEL
ALT: 13 U/L (ref 0–44)
AST: 17 U/L (ref 15–41)
Albumin: 1.8 g/dL — ABNORMAL LOW (ref 3.5–5.0)
Alkaline Phosphatase: 46 U/L (ref 38–126)
Anion gap: 9 (ref 5–15)
BUN: 43 mg/dL — ABNORMAL HIGH (ref 8–23)
CO2: 22 mmol/L (ref 22–32)
Calcium: 9.5 mg/dL (ref 8.9–10.3)
Chloride: 116 mmol/L — ABNORMAL HIGH (ref 98–111)
Creatinine, Ser: 1.51 mg/dL — ABNORMAL HIGH (ref 0.44–1.00)
GFR, Estimated: 37 mL/min — ABNORMAL LOW (ref >60)
Glucose, Bld: 170 mg/dL — ABNORMAL HIGH (ref 70–99)
Potassium: 4.1 mmol/L (ref 3.5–5.1)
Sodium: 147 mmol/L — ABNORMAL HIGH (ref 135–145)
Total Bilirubin: 0.7 mg/dL (ref 0.3–1.2)
Total Protein: 4.8 g/dL — ABNORMAL LOW (ref 6.5–8.1)

## 2020-09-03 LAB — GLUCOSE, CAPILLARY
Glucose-Capillary: 165 mg/dL — ABNORMAL HIGH (ref 70–99)
Glucose-Capillary: 157 mg/dL — ABNORMAL HIGH (ref 70–99)
Glucose-Capillary: 146 mg/dL — ABNORMAL HIGH (ref 70–99)
Glucose-Capillary: 159 mg/dL — ABNORMAL HIGH (ref 70–99)
Glucose-Capillary: 219 mg/dL — ABNORMAL HIGH (ref 70–99)

## 2020-09-03 LAB — C-REACTIVE PROTEIN: CRP: 11.7 mg/dL — ABNORMAL HIGH (ref <1.0)

## 2020-09-03 LAB — PREALBUMIN: Prealbumin: 11.8 mg/dL — ABNORMAL LOW (ref 18–38)

## 2020-09-03 MED ORDER — LACTATED RINGERS IV SOLN: INTRAVENOUS | Status: DC

## 2020-09-03 MED ORDER — ACETAMINOPHEN 10 MG/ML IV SOLN
1000.0000 mg | Freq: Four times a day (QID) | INTRAVENOUS | Status: AC
  Administered 2020-09-03 – 2020-09-04 (×4): 1000 mg via INTRAVENOUS
  Filled 2020-09-03 (×4): qty 100

## 2020-09-03 MED ORDER — FAMOTIDINE IN NACL 20-0.9 MG/50ML-% IV SOLN
20.0000 mg | Freq: Once | INTRAVENOUS | Status: AC
  Administered 2020-09-03: 20 mg via INTRAVENOUS
  Filled 2020-09-03: qty 50

## 2020-09-03 MED ORDER — SODIUM CHLORIDE 0.9% FLUSH: 10.0000 mL | INTRAVENOUS | Status: DC | PRN

## 2020-09-03 MED ORDER — METHYLPREDNISOLONE SODIUM SUCC 40 MG IJ SOLR
40.0000 mg | Freq: Two times a day (BID) | INTRAMUSCULAR | Status: DC
  Administered 2020-09-03 – 2020-09-04 (×2): 40 mg via INTRAVENOUS
  Filled 2020-09-03 (×2): qty 1

## 2020-09-03 MED ORDER — TRAVASOL 10 % IV SOLN
INTRAVENOUS | Status: AC
  Filled 2020-09-03: qty 518.4

## 2020-09-03 MED ORDER — LACTATED RINGERS IV SOLN: INTRAVENOUS | Status: AC

## 2020-09-03 NOTE — Progress Notes (Signed)
Initial Nutrition Assessment  DOCUMENTATION CODES:   Obesity unspecified  INTERVENTION:  TPN per pharmacy.   NUTRITION DIAGNOSIS:   Inadequate oral intake related to inability to eat as evidenced by NPO status.  GOAL:   Patient will meet greater than or equal to 90% of their needs  MONITOR:   Diet advancement, Skin, Weight trends, Labs, I & O's, Other (Comment) (TPN tolerance)  REASON FOR ASSESSMENT:   Consult New TPN/TNA  ASSESSMENT:   69 y.o. female was recently hospitalized for COVID-19 pneumonia-and discharged on 2-3 L of home O2-presents with worsening shortness of breath-found to have A. fib with RVR, worsening hypoxia. Pt developed severe upper abdominal pain-subsequent further imaging studies revealed perforated viscus. S/p laparotomy on 11/3 which showed a perforated duodenal ulcer. JP drain with 15 ml output. Pt with history of h/opartial colectomy/colostomy for colon cancer, completion colectomy and ileostomy.  11/3 - exploratory laparotomy, adhesiolysis, modified graham patch repair of perforated duodenal ulcer   Pt is currently NPO with NGT to LIWS. Pt has been NPO since admission (3 days). Pt is currently on 3 L/min nasal cannula. Per MD, plans to start TPN as pt unable tolerate PO at this time. Likely several days prior to removing NG or advancing diet per MD. Abdominal VAC placed today. Per Pharmacy, plans to start TPN at rate of 40 ml/hr today. PICC has been placed.   Unable to complete Nutrition-Focused physical exam at this time. Labs and medications reviewed.   Diet Order:   Diet Order            Diet NPO time specified Except for: Ice Chips  Diet effective now                 EDUCATION NEEDS:   Not appropriate for education at this time  Skin:  Skin Assessment: Skin Integrity Issues: Skin Integrity Issues:: Incisions Incisions: abdomen  Last BM:  11/5 colostomy 250 ml output  Height:   Ht Readings from Last 1 Encounters:  08/31/20 5'  8" (1.727 m)    Weight:   Wt Readings from Last 1 Encounters:  08/31/20 99.7 kg   BMI:  Body mass index is 33.42 kg/m.  Estimated Nutritional Needs:   Kcal:  2200-2450  Protein:  115-130 grams  Fluid:  >/= 2 L/day  Corrin Parker, MS, RD, LDN RD pager number/after hours weekend pager number on Am[employer].

## 2020-09-03 NOTE — Plan of Care (Signed)
  Problem: Coping: Goal: Coping ability will improve Outcome: Progressing Goal: Will verbalize feelings Outcome: Progressing   Problem: Health Behavior/Discharge Planning: Goal: Ability to make decisions will improve Outcome: Progressing Goal: Compliance with therapeutic regimen will improve Outcome: Progressing   Problem: Role Relationship: Goal: Will demonstrate positive changes in social behaviors and relationships Outcome: Progressing   Problem: Self-Concept: Goal: Level of anxiety will decrease Outcome: Progressing

## 2020-09-03 NOTE — Progress Notes (Signed)
[facility] Progress Note  2 Days Post-Op  Subjective: CC-  Abdomen sore but pain fairly well controlled. Using morphine with dressing changes. More pain from NG tube. Some bilious output from ileostomy. Worked with therapies yesterday. States that she sat in the chair for several hours. Down to 2L [place] with stable O2 sats.  Objective: Vital signs in last 24 hours: Temp:  [97.7 F (36.5 C)-98.9 F (37.2 C)] 98.6 F (37 C) (11/05 0730) Pulse Rate:  [81-98] 81 (11/05 0730) Resp:  [15-21] 21 (11/05 0730) BP: (81-114)/(50-64) 113/50 (11/05 0730) SpO2:  [94 %-100 %] 100 % (11/05 0730) Last BM Date: 09/02/20  Intake/Output from previous day: 11/04 0701 - 11/05 0700 In: 1929.9 [I.V.:1398.3; IV Piggyback:511.7] Out: 1915 [Urine:1650; Drains:15; Stool:250] Intake/Output this shift: No intake/output data recorded.  PE: Gen:  Alert, NAD, pleasant HEENT: EOM's intact, pupils equal and round Card:  Irregular rhythm, HR 80-90s Pulm:  rate and effort normal on 2L [place] Abd: soft, appropriately tender on incision and drain, open midline incision with healthy pink tissue, drain with SS output GU: foley with clear yellow urine  Lab Results:  Recent Labs    09/02/20 0313 09/03/20 0054  WBC 20.6* 18.4*  HGB 9.5* 8.9*  HCT 30.8* 28.7*  PLT 280 255   BMET Recent Labs    09/02/20 0313 09/03/20 0054  NA 144 147*  K 4.2 4.1  CL 114* 116*  CO2 18* 22  GLUCOSE 242* 170*  BUN 42* 43*  CREATININE 1.53* 1.51*  CALCIUM 8.8* 9.5   PT/INR No results for input(s): LABPROT, INR in the last 72 hours. CMP     Component Value Date/Time   NA 147 (H) 09/03/2020 0054   K 4.1 09/03/2020 0054   CL 116 (H) 09/03/2020 0054   CO2 22 09/03/2020 0054   GLUCOSE 170 (H) 09/03/2020 0054   BUN 43 (H) 09/03/2020 0054   CREATININE 1.51 (H) 09/03/2020 0054   CALCIUM 9.5 09/03/2020 0054   PROT 4.8 (L) 09/03/2020 0054   ALBUMIN 1.8 (L) 09/03/2020 0054   AST 17 09/03/2020 0054   ALT 13  09/03/2020 0054   ALKPHOS 46 09/03/2020 0054   BILITOT 0.7 09/03/2020 0054   GFRNONAA 37 (L) 09/03/2020 0054   Lipase     Component Value Date/Time   LIPASE 48 08/31/2020 2054       Studies/Results: CT ABDOMEN PELVIS WO CONTRAST  Addendum Date: 09/01/2020   ADDENDUM REPORT: 09/01/2020 14:53 ADDENDUM: Critical Value/emergent results were called by telephone at the time of interpretation on 09/01/2020 at 1447 hours to Dr. Oren Binet , who verbally acknowledged these results. Electronically Signed   By: Genevie Ann M.D.   On: 09/01/2020 14:53   Result Date: 09/01/2020 CLINICAL DATA:  69 year old female with abdominal pain. COVID-19. History of colon cancer status post resection. EXAM: CT ABDOMEN AND PELVIS WITHOUT CONTRAST TECHNIQUE: Multidetector CT imaging of the abdomen and pelvis was performed following the standard protocol without IV contrast. COMPARISON:  CTA chest yesterday.  CT Abdomen and Pelvis 06/07/2014. FINDINGS: Lower chest: Extensive lower lung pneumonia. No pericardial or pleural effusion. Hepatobiliary: Pneumoperitoneum and intermediate density free fluid adjacent to the liver. Vicarious contrast excretion to the gallbladder. Grossly normal liver parenchyma. Pancreas: Negative noncontrast pancreas. Spleen: Negative. Adrenals/Urinary Tract: Stable low-density bilateral adrenal adenomas since 2015. Chronic renal parapelvic cysts redemonstrated. No definite hydronephrosis. No acute perinephric stranding. Both ureters are decompressed. Excreted IV contrast in the urinary bladder. Stomach/Bowel: Left lower quadrant ostomy seen  in 2015 has been taken down, along with completion total colectomy since that time. 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 There is abnormal free air and intermediate density free fluid in the upper abdomen. Free air continues in the ventral small bowel mesentery toward the pelvis. And free air tracks along the right ileostomy loop. Oral contrast was  administered, and has not leaked from the stomach, duodenum, or proximal jejunum. No abnormally dilated loops. And the specific site of bowel perforation is unclear. There are several small bowel loops which might be adhered to the ventral abdominal wall abutting the greater curve of the stomach. Vascular/Lymphatic: Vascular patency is not evaluated in the absence of IV contrast. Mild Calcified aortic atherosclerosis. Normal caliber abdominal aorta. No lymphadenopathy. Reproductive: Retroverted uterus suspected, with surgically absent rectum. Other: Moderate pelvic free fluid with simple fluid density, similar to the 2015 CT. Distal colon appear surgically absent. Musculoskeletal: Advanced disc and endplate degeneration throughout the visible spine. No acute or suspicious osseous lesion identified. IMPRESSION: 1. Positive for bowel perforation: Pneumoperitoneum and intermediate density free fluid in the abdomen. Prior total colectomy. The specific site of perforation is unclear-oral contrast present to the proximal jejunum has not obviously leaked. Note that there may be small bowel loops adherent to the ventral abdominal wall along the greater curve of the stomach. 2. Extensive bilateral lower lung pneumonia. No pleural effusion. 3. Other abdominal and pelvic viscera are stable since 2015, including bilateral adrenal adenomas. Chronic renal parapelvic cysts. 4. Aortic Atherosclerosis (ICD10-I70.0). Electronically Signed: By: Genevie Ann M.D. On: 09/01/2020 14:40   ECHOCARDIOGRAM COMPLETE  Result Date: 09/01/2020    ECHOCARDIOGRAM REPORT   Patient Name:   Candace [name] Date of Exam: 09/01/2020 Medical Rec #:  [id number]    Height:       68.0 in Accession #:    [id number]   Weight:       219.8 lb Date of Birth:  1951-08-11    BSA:          2.127 m Patient Age:    8 years     BP:           103/57 mmHg Patient Gender: F            HR:           77 bpm. Exam Location:  Inpatient Procedure: 2D Echo Indications:    atrial  fibrillation 427.31  History:        Patient has no prior history of Echocardiogram examinations.                 Risk Factors:Hypertension, Diabetes and Dyslipidemia. Covid +.  Sonographer:    Jannett Celestine RDCS (AE) Referring Phys: [id number] AMY N [facility]  Sonographer Comments: Technically difficult study due to poor echo windows. Restricted mobility. IMPRESSIONS  1. Left ventricular ejection fraction, by estimation, is 70 to 75%. The left ventricle has hyperdynamic function. Left ventricular endocardial border not optimally defined to evaluate regional wall motion, but appears grossly normal. There is mild left ventricular hypertrophy.  2. RV is not well visualized due to image quality. Right ventricular systolic function is mildly reduced. The right ventricular size is mildly enlarged. TR signal inadequate for assessing RVSP.  3. The mitral valve is normal in structure. Trivial mitral valve regurgitation. No evidence of mitral stenosis.  4. The aortic valve is tricuspid. Aortic valve regurgitation is not visualized. No aortic stenosis is present.  5. The inferior vena cava is normal in  size with greater than 50% respiratory variability, suggesting right atrial pressure of 3 mmHg. FINDINGS  Left Ventricle: Midcavitary gradient 17.5 mmHg, Vmax 2 m/s. Left ventricular ejection fraction, by estimation, is 70 to 75%. The left ventricle has hyperdynamic function. Left ventricular endocardial border not optimally defined to evaluate regional wall motion. The left ventricular internal cavity size was normal in size. There is mild left ventricular hypertrophy. Left ventricular diastolic parameters are indeterminate. Right Ventricle: RV is not well visualized. The right ventricular size is mildly enlarged. No increase in right ventricular wall thickness. Right ventricular systolic function is mildly reduced. Tricuspid regurgitation signal is inadequate for assessing PA pressure. Left Atrium: Left atrial size was normal in size.  Right Atrium: Right atrial size was normal in size. Pericardium: There is no evidence of pericardial effusion. Mitral Valve: The mitral valve is normal in structure. Trivial mitral valve regurgitation. No evidence of mitral valve stenosis. Tricuspid Valve: The tricuspid valve is normal in structure. Tricuspid valve regurgitation is not demonstrated. No evidence of tricuspid stenosis. Aortic Valve: The aortic valve is tricuspid. Aortic valve regurgitation is not visualized. No aortic stenosis is present. Pulmonic Valve: The pulmonic valve was grossly normal. Pulmonic valve regurgitation is trivial. No evidence of pulmonic stenosis. Aorta: The aortic root is normal in size and structure. Venous: The inferior vena cava is normal in size with greater than 50% respiratory variability, suggesting right atrial pressure of 3 mmHg. IAS/Shunts: No atrial level shunt detected by color flow Doppler.  LEFT VENTRICLE PLAX 2D LVIDd:         3.45 cm LVIDs:         2.30 cm LV PW:         1.20 cm LV IVS:        1.15 cm LVOT diam:     2.20 cm LV SV:         70 LV SV Index:   33 LVOT Area:     3.80 cm  LEFT ATRIUM           Index LA diam:      3.20 cm 1.50 cm/m LA Vol (A2C): 32.9 ml 15.46 ml/m  AORTIC VALVE LVOT Vmax:   101.00 cm/s LVOT Vmean:  85.000 cm/s LVOT VTI:    0.185 m  AORTA Ao Root diam: 2.90 cm MITRAL VALVE MV Area (PHT): 2.95 cm    SHUNTS MV Decel Time: 257 msec    Systemic VTI:  0.18 m MV E velocity: 78.00 cm/s  Systemic Diam: 2.20 cm Cherlynn Kaiser MD Electronically signed by Cherlynn Kaiser MD Signature Date/Time: 09/01/2020/11:02:05 AM    Final    [place] EKG SITE RITE  Result Date: 09/03/2020 If Site Rite image not attached, placement could not be confirmed due to current cardiac rhythm.   Anti-infectives: Anti-infectives (From admission, onward)   Start     Dose/Rate Route Frequency Ordered Stop   09/02/20 1600  cefTRIAXone (ROCEPHIN) 1 g in sodium chloride 0.9 % 100 mL IVPB  Status:  Discontinued        1  g 200 mL/hr over 30 Minutes Intravenous Every 24 hours 09/01/20 1811 09/02/20 0838   09/01/20 1800  fluconazole (DIFLUCAN) IVPB 400 mg        400 mg 50 mL/hr over 240 Minutes Intravenous  Once 09/01/20 1749 09/02/20 0603   09/01/20 1530  piperacillin-tazobactam (ZOSYN) IVPB 3.375 g        3.375 g 12.5 mL/hr over 240 Minutes Intravenous Every 8 hours 09/01/20  1514 09/05/20 2359   09/01/20 1000  levofloxacin (LEVAQUIN) tablet 250 mg  Status:  Discontinued        250 mg Oral Daily 08/31/20 1508 08/31/20 1735   08/31/20 1730  cefTRIAXone (ROCEPHIN) 1 g in sodium chloride 0.9 % 100 mL IVPB  Status:  Discontinued        1 g 200 mL/hr over 30 Minutes Intravenous Every 24 hours 08/31/20 1726 09/01/20 1513   08/31/20 1730  azithromycin (ZITHROMAX) 500 mg in sodium chloride 0.9 % 250 mL IVPB  Status:  Discontinued        500 mg 250 mL/hr over 60 Minutes Intravenous Every 24 hours 08/31/20 1726 09/02/20 0838       Assessment/Plan HTN HLD DM Hx colon cancer s/p partial colectomy/colostomy followed by completion colectomy and ileostomy Hypoxia requiring 3L O2 Covid-19 pneumonia  Elevated D dimer on eliquis - eliquis reversed with kcentra, hold eliquis A fib RVR Anemia - Hgb 8.9 from 9.5, stable, monitor 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 With her CKD will discuss central access with primary team  Pneumoperitoneum, perforated duodenal ulcer S/p exploratory laparotomy, adhesiolysis, modified graham patch repair of perforated duodenal ulcer 11/3 Dr. Bobbye Morton - POD#2 - Continue NPO/NGT to LIWS, nothing per NG - plan for UGI on POD#5 prior to removing NG or advancing diet - continue PPI BID - plan for 4 days IV zosyn - continue JP drain and monitor output - will ask WOC to apply abdominal wound vac today - continue PT/OT, mobilize. Therapies currently recommending  SNF but patient/family have refused in the past - pain control: scheduled IV tylenol, IV robaxin PRN, IV morphine PRN  ID -zosyn 11/3>>day#2/4 VTE -therapeutic lovenox FEN -IVF, NPO/NGT to LIWS Foley -d/c 11/5 Follow up -Dr. Chrystie Nose - daughter Candace [name] [phone]   LOS: 2 days    Wellington Hampshire, [facility] 09/03/2020, 7:48 AM Please see Amion for pager number during day hours 7:00am-4:30pm

## 2020-09-03 NOTE — Consult Note (Signed)
[place] Nurse Consult Note: Patient receiving care in [facility] [phone] Reason for Consult: Wound vac start Wound type: Surgical wound Measurement: 15.5 cm x 3 cm x 4 cm Wound bed: 100% red granulation tissue Drainage (amount, consistency, odor) serosanguinous on gauze.  Periwound: Intact Dressing procedure/placement/frequency:  Wound vac started today. Filled wound with 1 piece of black foam, drape applied, immediate seal obtained at 125 mm Hg. Patient tolerated procedure well.   [place] nurse will continue to provide NPWT dressing change M-W-F.  [place] Nurse ostomy consult note Not a new pouch. Prior colostomy for colon cancer. New bowel resection this admission Stoma type/location: RUQ ileostomy Ostomy pouching: 2pc.flat 1 3/4" [name] # [phone]) 2 piece skin barrier [name] # 2) with barrier ring [name] # [id number]) Pouch not changed today as patient states it was changed yesterday.  Supplies ordered and should be placed at bedside.   Thank you for the consult. [place] nurse will change wound vac M-W-F.  Please re-consult the [place] team if needed.  Cathlean Marseilles Tamala Julian, MSN, RN, [place], Lysle Pearl, [facility] Wound Treatment Associate Pager [phone]

## 2020-09-03 NOTE — Progress Notes (Signed)
PHARMACY - TOTAL PARENTERAL NUTRITION CONSULT NOTE   Indication: Not tolerating enteral feeds  Patient Measurements: Height: 5\' 8"  (172.7 cm) Weight: 99.7 kg (219 lb 12.8 oz) IBW/kg (Calculated) : 63.9 TPN AdjBW (KG): 72.9 Body mass index is 33.42 kg/m.  Assessment: Candace [name] is a 69yo female PMH HTN, HLD, DM currently admitted to [facility] with covid-19. She was admitted 10/25 through 10/31 with covid-19, discharged home on supplemental oxygen and on eliquis due to elevated d-dimer. She returned to the ED yesterday when her home health nurse was concerned about elevated heart rate. She was found to be in Afib RVR and is now on cardizem drip with HR 90's. Late last night she developed abdominal pain that progressively got worse today. CT scan was obtained and shows bowel perforation, pneumoperitoneum and intermediate density free fluid in the abdomen. Of note, patient has a prior h/o partial colectomy/colostomy for colon cancer, completion colectomy and ileostomy. S/pexploratory laparotomy, adhesiolysis, modified graham patch repair of perforated duodenal ulcer11/3.  Pharmacy consulted to start TPN due to baseline malnutrition and likely prolonged inability to tolerate PO feeds.   Glucose / Insulin: Currently on sensitive SSI - required 9 units yesterday Electrolytes: Na 147, K 4.1 Renal: Scr 1.51, BUN 43 LFTs / TGs: WNL Prealbumin / albumin: 11.8; 1.8 Intake / Output; MIVF: UOP 0.8 ml/kg/hr; LR @ 75 ml/hr, drain 30 mls, stool 250 mls GI Imaging: None since TPN start Surgeries / Procedures:  11/3 exploratory laparotomy, adhesiolysis, modified graham patch repair of perforated duodenal ulcer  Central access: to be obtained 11/5 TPN start date: 11/5  Nutritional Goals (per Pharm D estimates): kCal: 2160 - 2520, Protein: 95 - 115 gms , Fluid: approx 1.8 L Goal TPN rate is 75 mL/hr (provides 100 g of protein and 2200 kcals per day)  Current Nutrition:  None  Plan:  Start TPN at 40  mL/hr at 1800 Electrolytes in TPN: 18mEq/L of Na, 25 mEq/L of K, 51mEq/L of Ca, 47mEq/L of Mg, and 77mmol/L of Phos. Cl:Ac ratio 1:2 Add standard MVI and trace elements to TPN Continue Sensitive SSI q4hr and adjust as needed  Reduce MIVF to 35 mL/hr at 1800 Monitor TPN labs on tomorrow and Mon/Thurs   Alanda Slim, PharmD, [facility] Clinical Pharmacist Please see AMION for all Pharmacists' Contact Phone Numbers 09/03/2020, 9:34 AM

## 2020-09-03 NOTE — Progress Notes (Addendum)
PROGRESS NOTE                                                                                                                                                                                                             Patient Demographics:    Candace [name], is a 69 y.o. female, DOB - [birth year], XLK:[id number]  Outpatient Primary MD for the patient is [name], [name], MD   Admit date - 08/31/2020   LOS - 2  No chief complaint on file.      Brief Narrative: Patient is a 69 y.o. female was recently hospitalized for COVID-19 pneumonia-and discharged on 2-3 L of home O2-presents with worsening shortness of breath-found to have A. fib with RVR, worsening hypoxia requiring up to 6 L of oxygen and subsequently admitted to the hospitalist service.  Post admission-patient developed severe upper abdominal pain-subsequent further imaging studies revealed perforated viscus-she underwent a laparotomy on 11/3 which showed a perforated duodenal ulcer.  See below for further details.  COVID-19 vaccinated status: Unvaccinated  Significant Events: 10/25-10/31>> hospitalization for COVID-19 pneumonia-discharged on 2-3 L of oxygen. 11/2>> Admit to [facility] for worsening hypoxemia requiring 6 L of oxygen, A. fib with RVR 11/3>> developed abdominal pain-CT abdomen positive for perforated viscus-underwent emergent laparotomy  Significant studies: 10/26>> bilateral lower extremity Doppler: No DVT. 10/27>> renal ultrasound: Cystic changes at the renal hila likely parapelvic cysts.  Hydronephrosis felt to be less likely. 11/2>>Chest x-ray: Persistent bilateral pulmonary infiltrates consistent with multifocal pneumonia 11/2>> CTA chest: No PE, areas of consolidation and groundglass density bilaterally. 11/3>> Echo: EF 70-75% 11/3>> CT abdomen/pelvis: Pneumoperitoneum/intermediate density free fluid in the abdomen-positive for bowel  perforation.  COVID-19 medications: Steroids: Resume 11/2>> Remdesivir: 10/25>> 10/29  Antibiotics: Rocephin: 11/2 x1 Zithromax: 11/12>> 11/3 Zosyn: 11/3>>  Microbiology data: 10/25 >>blood culture: No growth  Procedures: 11/12>> ex lap-modified Phillip Heal patch repair of perforated duodenal ulcer.  Consults: None  DVT prophylaxis: Place TED hose Start: 08/31/20 1711 Place TED hose Start: 08/31/20 1507 Started on therapeutic Lovenox on 11/4    Subjective:   Stable on 2-3 L of oxygen-some loose stools in the ostomy bag.  Pain at the operative site-but otherwise appears comfortable.   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  Doubt bacterial pneumonia-procalcitonin levels are normal-she is on Zosyn to cover her intra-abdominal issues.  Continue Solu-Medrol-but taper down to 40 mg twice daily-CRP is elevated-but this is likely more from surgical issues rather than worsening of her COVID-19 inflammation.    Fever: afebrile O2 requirements:  SpO2: 100 % O2 Flow Rate (L/min): 3 L/min   COVID-19 Labs: Recent Labs    08/31/20 1922 09/01/20 0718 09/02/20 0313 09/03/20 0054  DDIMER  --  2.53* 2.96* 7.49*  LDH 533*  --   --   --   CRP 9.1*  --  12.4* 11.7*       Component Value Date/Time   BNP 51.0 08/29/2020 0323    Recent Labs  Lab 08/28/20 0241 08/29/20 0323 09/01/20 0718  PROCALCITON 0.54 0.36 <0.10    Lab Results  Component Value Date   SARSCOV2NAA POSITIVE (A) 08/23/2020     Prone/Incentive Spirometry: encouraged incentive spirometry use 3-4/hour.  Elevated D-dimer: Secondary surgical issues rather than COVID-19-irrespective is on therapeutic Lovenox.  Paroxysmal atrial fibrillation with RVR: Remains in atrial fibrillation-rate controlled with Cardizem-oral metoprolol/Eliquis on hold as patient n.p.o.  After discussion with general surgery-have started SQ  Lovenox.  CHA2DS2-VASc score of at least 3, echo with preserved EF.  Once stable for oral intake-we will attempt to slowly put her back on oral rate control agents.  AKI on CKD stage IIIb: AKI hemodynamically mediated-improved after hydration and close to baseline.  Perforated duodenal ulcer s/p exploratory laparotomy on 11/3: Remains n.p.o.-NG tube/JP drain in place-General surgery following and directing care.  Remains on PPI twice daily.  General surgery planning on starting TNA.  Anemia: Likely secondary to acute/critical illness-follow.  HLD: Hold statin as patient n.p.o.  DM-2 (A1c 7.0 on 10/26) with uncontrolled hyperglycemia due to steroids: CBGs stable-continue SSI.  Recent Labs    09/02/20 2345 09/03/20 0404 09/03/20 0740  GLUCAP 164* 165* 157*    History of colon cancer-s/p colostomy  Obesity: Estimated body mass index is 33.42 kg/m as calculated from the following:   Height as of this encounter: 5\' 8"  (1.727 m).   Weight as of this encounter: 99.7 kg.    GI prophylaxis: PPI  ABG:    Component Value Date/Time   PHART 7.348 (L) 09/01/2020 1817   PCO2ART 34.7 09/01/2020 1817   PO2ART 53 (L) 09/01/2020 1817   HCO3 19.1 (L) 09/01/2020 1817   TCO2 20 (L) 09/01/2020 1817   ACIDBASEDEF 6.0 (H) 09/01/2020 1817   O2SAT 85.0 09/01/2020 1817    Vent Settings: N/A   Condition -Stable  Family Communication  :  Daughter [name] [phone]) updated over the phone 11/5  Code Status :  Full Code Diet :  Diet Order            Diet NPO time specified Except for: Ice Chips  Diet effective now                  Disposition Plan  :   The patient will require care spanning > 2 midnights and should be moved to inpatient because: Inpatient level of care appropriate due to severity of illness  Dispo: The patient is from: Home              Anticipated d/c is to: Home              Anticipated d/c date is: > 3 days              Patient currently is not  medically stable to d/c.    Barriers to discharge: Hypoxia requiring O2 supplementation-perforated duodenal ulcer-s/p exploratory laparotomy on 11/3-remains n.p.o. with NG tube/JP drains in place.  Antimicorbials  :    Anti-infectives (From admission, onward)   Start     Dose/Rate Route Frequency Ordered Stop   09/02/20 1600  cefTRIAXone (ROCEPHIN) 1 g in sodium chloride 0.9 % 100 mL IVPB  Status:  Discontinued        1 g 200 mL/hr over 30 Minutes Intravenous Every 24 hours 09/01/20 1811 09/02/20 0838   09/01/20 1800  fluconazole (DIFLUCAN) IVPB 400 mg        400 mg 50 mL/hr over 240 Minutes Intravenous  Once 09/01/20 1749 09/02/20 0603   09/01/20 1530  piperacillin-tazobactam (ZOSYN) IVPB 3.375 g        3.375 g 12.5 mL/hr over 240 Minutes Intravenous Every 8 hours 09/01/20 1514 09/05/20 2359   09/01/20 1000  levofloxacin (LEVAQUIN) tablet 250 mg  Status:  Discontinued        250 mg Oral Daily 08/31/20 1508 08/31/20 1735   08/31/20 1730  cefTRIAXone (ROCEPHIN) 1 g in sodium chloride 0.9 % 100 mL IVPB  Status:  Discontinued        1 g 200 mL/hr over 30 Minutes Intravenous Every 24 hours 08/31/20 1726 09/01/20 1513   08/31/20 1730  azithromycin (ZITHROMAX) 500 mg in sodium chloride 0.9 % 250 mL IVPB  Status:  Discontinued        500 mg 250 mL/hr over 60 Minutes Intravenous Every 24 hours 08/31/20 1726 09/02/20 0838      Inpatient Medications  Scheduled Meds:  Chlorhexidine Gluconate Cloth  6 each Topical Daily   enoxaparin (LOVENOX) injection  100 mg Subcutaneous Q12H   insulin aspart  0-9 Units Subcutaneous Q4H   metoprolol tartrate  25 mg Per Tube BID   pantoprazole (PROTONIX) IV  40 mg Intravenous Q12H   Continuous Infusions:  acetaminophen 1,000 mg (09/03/20 0841)   diltiazem (CARDIZEM) infusion 7.5 mg/hr (09/03/20 0404)   lactated ringers     lactated ringers     methocarbamol (ROBAXIN) IV     piperacillin-tazobactam (ZOSYN)  IV 3.375 g (09/03/20 1002)    TPN ADULT (ION)     PRN Meds:.albuterol, alum & mag hydroxide-simeth, methocarbamol (ROBAXIN) IV, metoprolol tartrate, morphine injection, phenol, sodium chloride   Time Spent in minutes  25    See all Orders from today for further details   Oren Binet M.D on 09/03/2020 at 11:18 AM  To page go to [email or web address] - use universal password  [facility] -  Office  [phone]    Objective:   Vitals:   09/02/20 2346 09/03/20 0420 09/03/20 0606 09/03/20 0730  BP: (!) 97/59 108/64  (!) 113/50  Pulse: 86 86  81  Resp: 18 15  (!) 21  Temp: 97.9 F (36.6 C) 97.7 F (36.5 C) 98.9 F (37.2 C) 98.6 F (37 C)  TempSrc: Oral Oral Oral Axillary  SpO2: 97% 95%  100%  Weight:      Height:        Wt Readings from Last 3 Encounters:  08/31/20 99.7 kg  08/23/20 99.8 kg     Intake/Output Summary (Last 24 hours) at 09/03/2020 1118 Last data filed at 09/03/2020 0630 Gross per 24 hour  Intake 1909.92 ml  Output 1915 ml  Net -5.08 ml     Physical Exam Gen Exam:Alert awake-not in any distress HEENT:atraumatic, normocephalic Chest: B/L clear to auscultation  anteriorly CVS:S1S2 regular Abdomen:soft-appropriately tender-minimal stool in the ostomy bag Extremities:no edema Neurology: Non focal Skin: no rash   Data Review:    CBC Recent Labs  Lab 08/28/20 0241 08/28/20 0241 08/29/20 0323 08/29/20 0323 08/31/20 1315 08/31/20 1315 09/01/20 0043 09/01/20 1643 09/01/20 1817 09/02/20 0313 09/03/20 0054  WBC 10.8*   < > 12.8*  --  14.5*  --  11.6*  --   --  20.6* 18.4*  HGB 13.0   < > 13.6   < > 13.4   < > 12.1 12.6 10.5* 9.5* 8.9*  HCT 41.2   < > 43.6   < > 42.4   < > 39.5 37.0 31.0* 30.8* 28.7*  PLT 308   < > 366  --  365  --  324  --   --  280 255  MCV 72.0*   < > 73.6*  --  73.2*  --  76.1*  --   --  74.0* 74.5*  MCH 22.7*   < > 23.0*  --  23.1*  --  23.3*  --   --  22.8* 23.1*  MCHC 31.6   < > 31.2  --  31.6  --  30.6  --   --  30.8 31.0  RDW 15.3   < > 15.4   --  15.1  --  15.3  --   --  15.3 15.5  LYMPHSABS 0.4*  --  0.4*  --  0.4*  --   --   --   --   --   --   MONOABS 1.5*  --  1.4*  --  1.2*  --   --   --   --   --   --   EOSABS 0.0  --  0.0  --  0.0  --   --   --   --   --   --   BASOSABS 0.0  --  0.0  --  0.0  --   --   --   --   --   --    < > = values in this interval not displayed.    Chemistries  Recent Labs  Lab 08/28/20 0241 08/28/20 0241 08/29/20 0323 08/29/20 0323 08/31/20 1315 08/31/20 1315 09/01/20 0043 09/01/20 1643 09/01/20 1817 09/02/20 0313 09/03/20 0054  NA 143   < > 143   < > 136   < > 142 140 143 144 147*  K 4.4   < > 4.7   < > 4.9   < > 5.1 3.7 3.7 4.2 4.1  CL 109   < > 108  --  102  --  110  --   --  114* 116*  CO2 23   < > 25  --  19*  --  19*  --   --  18* 22  GLUCOSE 216*   < > 269*  --  316*  --  312*  --   --  242* 170*  BUN 64*   < > 56*  --  60*  --  45*  --   --  42* 43*  CREATININE 1.38*   < > 1.45*  --  1.69*  --  1.51*  --   --  1.53* 1.51*  CALCIUM 10.3   < > 10.4*  --  10.1  --  9.4  --   --  8.8* 9.5  MG 1.8  --  1.7  --  1.8  --   --   --   --   --   --  AST 26  --  29  --  29  --   --   --   --  17 17  ALT 21  --  25  --  21  --   --   --   --  14 13  ALKPHOS 55  --  59  --  57  --   --   --   --  40 46  BILITOT 0.3  --  0.6  --  1.2  --   --   --   --  0.6 0.7   < > = values in this interval not displayed.   ------------------------------------------------------------------------------------------------------------------ No results for input(s): CHOL, HDL, LDLCALC, TRIG, CHOLHDL, LDLDIRECT in the last 72 hours.  Lab Results  Component Value Date   HGBA1C 7.0 (H) 08/24/2020   ------------------------------------------------------------------------------------------------------------------ Recent Labs    08/31/20 1220  TSH 0.015*   ------------------------------------------------------------------------------------------------------------------ No results for input(s):  VITAMINB12, FOLATE, FERRITIN, TIBC, IRON, RETICCTPCT in the last 72 hours.  Coagulation profile No results for input(s): INR, PROTIME in the last 168 hours.  Recent Labs    09/02/20 0313 09/03/20 0054  DDIMER 2.96* 7.49*    Cardiac Enzymes No results for input(s): CKMB, TROPONINI, MYOGLOBIN in the last 168 hours.  Invalid input(s): CK ------------------------------------------------------------------------------------------------------------------    Component Value Date/Time   BNP 51.0 08/29/2020 0323    Micro Results No results found for this or any previous visit (from the past 240 hour(s)).  Radiology Reports CT ABDOMEN PELVIS WO CONTRAST  Addendum Date: 09/01/2020   ADDENDUM REPORT: 09/01/2020 14:53 ADDENDUM: Critical Value/emergent results were called by telephone at the time of interpretation on 09/01/2020 at 1447 hours to Dr. Oren Binet , who verbally acknowledged these results. Electronically Signed   By: Genevie Ann M.D.   On: 09/01/2020 14:53   Result Date: 09/01/2020 CLINICAL DATA:  69 year old female with abdominal pain. COVID-19. History of colon cancer status post resection. EXAM: CT ABDOMEN AND PELVIS WITHOUT CONTRAST TECHNIQUE: Multidetector CT imaging of the abdomen and pelvis was performed following the standard protocol without IV contrast. COMPARISON:  CTA chest yesterday.  CT Abdomen and Pelvis 06/07/2014. FINDINGS: Lower chest: Extensive lower lung pneumonia. No pericardial or pleural effusion. Hepatobiliary: Pneumoperitoneum and intermediate density free fluid adjacent to the liver. Vicarious contrast excretion to the gallbladder. Grossly normal liver parenchyma. Pancreas: Negative noncontrast pancreas. Spleen: Negative. Adrenals/Urinary Tract: Stable low-density bilateral adrenal adenomas since 2015. Chronic renal parapelvic cysts redemonstrated. No definite hydronephrosis. No acute perinephric stranding. Both ureters are decompressed. Excreted IV contrast in  the urinary bladder. Stomach/Bowel: Left lower quadrant ostomy seen in 2015 has been taken down, along with completion total colectomy since that time. 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 There is abnormal free air and intermediate density free fluid in the upper abdomen. Free air continues in the ventral small bowel mesentery toward the pelvis. And free air tracks along the right ileostomy loop. Oral contrast was administered, and has not leaked from the stomach, duodenum, or proximal jejunum. No abnormally dilated loops. And the specific site of bowel perforation is unclear. There are several small bowel loops which might be adhered to the ventral abdominal wall abutting the greater curve of the stomach. Vascular/Lymphatic: Vascular patency is not evaluated in the absence of IV contrast. Mild Calcified aortic atherosclerosis. Normal caliber abdominal aorta. No lymphadenopathy. Reproductive: Retroverted uterus suspected, with surgically absent rectum. Other: Moderate pelvic free fluid with simple fluid density, similar to the 2015 CT. Distal  colon appear surgically absent. Musculoskeletal: Advanced disc and endplate degeneration throughout the visible spine. No acute or suspicious osseous lesion identified. IMPRESSION: 1. Positive for bowel perforation: Pneumoperitoneum and intermediate density free fluid in the abdomen. Prior total colectomy. The specific site of perforation is unclear-oral contrast present to the proximal jejunum has not obviously leaked. Note that there may be small bowel loops adherent to the ventral abdominal wall along the greater curve of the stomach. 2. Extensive bilateral lower lung pneumonia. No pleural effusion. 3. Other abdominal and pelvic viscera are stable since 2015, including bilateral adrenal adenomas. Chronic renal parapelvic cysts. 4. Aortic Atherosclerosis (ICD10-I70.0). Electronically Signed: By: Genevie Ann M.D. On: 09/01/2020 14:40   CT Angio Chest PE W and/or Wo  Contrast  Result Date: 08/31/2020 CLINICAL DATA:  Hypoxia, positive COVID EXAM: CT ANGIOGRAPHY CHEST WITH CONTRAST TECHNIQUE: Multidetector CT imaging of the chest was performed using the standard protocol during bolus administration of intravenous contrast. Multiplanar CT image reconstructions and MIPs were obtained to evaluate the vascular anatomy. CONTRAST:  38mL OMNIPAQUE IOHEXOL 350 MG/ML SOLN COMPARISON:  None. FINDINGS: Cardiovascular: Satisfactory opacification of the pulmonary arteries to the segmental level. No evidence of pulmonary embolism. Normal heart size. No pericardial effusion. Mediastinum/Nodes: No enlarged lymph nodes. Lungs/Pleura: Bilateral areas of consolidation and ground-glass density with a lower lobe predominance. No pleural effusion. No pneumothorax. Upper Abdomen: Partially imaged left adrenal nodule was present on 2015 abdominal CT. Right adrenal nodule was also present in 2015. Musculoskeletal: Degenerative changes of the included spine. Review of the MIP images confirms the above findings. IMPRESSION: No evidence of acute pulmonary embolism. COVID-19 pneumonia. Electronically Signed   By: Macy Mis M.D.   On: 08/31/2020 16:19   US RENAL  Result Date: 08/25/2020 CLINICAL DATA:  Acute kidney injury. EXAM: RENAL / URINARY TRACT ULTRASOUND COMPLETE COMPARISON:  Abdominal CT 06/07/2014 FINDINGS: Right Kidney: Renal measurements: 10.7 x 5.2 x 5.0 cm = volume: 146 mL. Cystic changes at the renal hila likely parapelvic cysts as seen on prior CT. Normal renal parenchymal echogenicity. No shadowing calculus. No solid renal mass. Left Kidney: Renal measurements: 10.7 x 6.2 x 5.7 cm = volume: 197 mL. Cystic changes at the renal hila likely parapelvic cyst is seen on prior CT. Normal renal parenchymal echogenicity. No shadowing calculus. No solid renal mass. Bladder: Appears normal for degree of bladder distention. Both ureteral jets are seen. Other: None. IMPRESSION: Cystic changes at  the renal hila likely parapelvic cysts as seen on prior CT. Mild bilateral hydronephrosis is felt less likely as both ureteral jets are seen in the bladder. Electronically Signed   By: Keith Rake M.D.   On: 08/25/2020 01:55   DG Chest Portable 1 View  Result Date: 08/31/2020 CLINICAL DATA:  Dyspnea at home health check up this morning, had COVID-19 diagnosis 10 days prior, recent discharge, new hypoxia EXAM: PORTABLE CHEST 1 VIEW COMPARISON:  Portable exam 1237 hours compared to 08/27/2020 FINDINGS: Normal heart size, mediastinal contours, and pulmonary vascularity. Atherosclerotic calcification aorta. Persistent infiltrates identified in the mid to lower lungs bilaterally consistent with multifocal pneumonia and COVID-19. Appearance unchanged from previous exam. No pleural effusion or pneumothorax. IMPRESSION: Persistent BILATERAL pulmonary infiltrates consistent with multifocal pneumonia and COVID-19. Electronically Signed   By: Lavonia Dana M.D.   On: 08/31/2020 12:45   DG Chest Port 1 View  Result Date: 08/27/2020 CLINICAL DATA:  Shortness of breath EXAM: PORTABLE CHEST 1 VIEW COMPARISON:  08/25/2020 FINDINGS: No significant interval change in  AP portable examination with heterogeneous bibasilar airspace opacity and possible small layering pleural effusions. No new airspace opacity. Cardiomegaly. IMPRESSION: No significant interval change in AP portable examination with heterogeneous bibasilar airspace opacity and possible small layering pleural effusions, consistent with multifocal infection. No new airspace opacity. Electronically Signed   By: Eddie Candle M.D.   On: 08/27/2020 11:04   DG Chest Port 1 View  Result Date: 08/25/2020 CLINICAL DATA:  Shortness of breath.  COVID-19 positive. EXAM: PORTABLE CHEST 1 VIEW COMPARISON:  August 23, 2020. FINDINGS: Stable cardiomediastinal silhouette. No pneumothorax is noted. Stable bilateral lung opacities are noted concerning for multifocal  pneumonia. Bony thorax is unremarkable. IMPRESSION: Stable bilateral lung opacities are noted concerning for multifocal pneumonia. Electronically Signed   By: Marijo Conception M.D.   On: 08/25/2020 09:36   DG Chest Port 1 View  Result Date: 08/23/2020 CLINICAL DATA:  Shortness of breath EXAM: PORTABLE CHEST 1 VIEW COMPARISON:  None. FINDINGS: There is ill-defined airspace opacity in portions of each mid and lower lung region. Heart size and pulmonary vascularity are normal. No adenopathy. There is degenerative change in the left shoulder. IMPRESSION: Ill-defined airspace opacity in the mid and lower lung regions, most indicative of multifocal pneumonia. Question atypical organism pneumonia given this appearance. Check of COVID-19 status in this regard advised. Heart size within normal limits.  No adenopathy appreciable. Electronically Signed   By: Lowella Grip III M.D.   On: 08/23/2020 11:33   ECHOCARDIOGRAM COMPLETE  Result Date: 09/01/2020    ECHOCARDIOGRAM REPORT   Patient Name:   HARMONII KARLE Date of Exam: 09/01/2020 Medical Rec #:  [id number]    Height:       68.0 in Accession #:    [id number]   Weight:       219.8 lb Date of Birth:  1950/12/07    BSA:          2.127 m Patient Age:    51 years     BP:           103/57 mmHg Patient Gender: F            HR:           77 bpm. Exam Location:  Inpatient Procedure: 2D Echo Indications:    atrial fibrillation 427.31  History:        Patient has no prior history of Echocardiogram examinations.                 Risk Factors:Hypertension, Diabetes and Dyslipidemia. Covid +.  Sonographer:    Jannett Celestine RDCS (AE) Referring Phys: [id number] AMY N [facility]  Sonographer Comments: Technically difficult study due to poor echo windows. Restricted mobility. IMPRESSIONS  1. Left ventricular ejection fraction, by estimation, is 70 to 75%. The left ventricle has hyperdynamic function. Left ventricular endocardial border not optimally defined to evaluate regional wall motion,  but appears grossly normal. There is mild left ventricular hypertrophy.  2. RV is not well visualized due to image quality. Right ventricular systolic function is mildly reduced. The right ventricular size is mildly enlarged. TR signal inadequate for assessing RVSP.  3. The mitral valve is normal in structure. Trivial mitral valve regurgitation. No evidence of mitral stenosis.  4. The aortic valve is tricuspid. Aortic valve regurgitation is not visualized. No aortic stenosis is present.  5. The inferior vena cava is normal in size with greater than 50% respiratory variability, suggesting right atrial pressure of 3 mmHg. FINDINGS  Left Ventricle:  Midcavitary gradient 17.5 mmHg, Vmax 2 m/s. Left ventricular ejection fraction, by estimation, is 70 to 75%. The left ventricle has hyperdynamic function. Left ventricular endocardial border not optimally defined to evaluate regional wall motion. The left ventricular internal cavity size was normal in size. There is mild left ventricular hypertrophy. Left ventricular diastolic parameters are indeterminate. Right Ventricle: RV is not well visualized. The right ventricular size is mildly enlarged. No increase in right ventricular wall thickness. Right ventricular systolic function is mildly reduced. Tricuspid regurgitation signal is inadequate for assessing PA pressure. Left Atrium: Left atrial size was normal in size. Right Atrium: Right atrial size was normal in size. Pericardium: There is no evidence of pericardial effusion. Mitral Valve: The mitral valve is normal in structure. Trivial mitral valve regurgitation. No evidence of mitral valve stenosis. Tricuspid Valve: The tricuspid valve is normal in structure. Tricuspid valve regurgitation is not demonstrated. No evidence of tricuspid stenosis. Aortic Valve: The aortic valve is tricuspid. Aortic valve regurgitation is not visualized. No aortic stenosis is present. Pulmonic Valve: The pulmonic valve was grossly normal.  Pulmonic valve regurgitation is trivial. No evidence of pulmonic stenosis. Aorta: The aortic root is normal in size and structure. Venous: The inferior vena cava is normal in size with greater than 50% respiratory variability, suggesting right atrial pressure of 3 mmHg. IAS/Shunts: No atrial level shunt detected by color flow Doppler.  LEFT VENTRICLE PLAX 2D LVIDd:         3.45 cm LVIDs:         2.30 cm LV PW:         1.20 cm LV IVS:        1.15 cm LVOT diam:     2.20 cm LV SV:         70 LV SV Index:   33 LVOT Area:     3.80 cm  LEFT ATRIUM           Index LA diam:      3.20 cm 1.50 cm/m LA Vol (A2C): 32.9 ml 15.46 ml/m  AORTIC VALVE LVOT Vmax:   101.00 cm/s LVOT Vmean:  85.000 cm/s LVOT VTI:    0.185 m  AORTA Ao Root diam: 2.90 cm MITRAL VALVE MV Area (PHT): 2.95 cm    SHUNTS MV Decel Time: 257 msec    Systemic VTI:  0.18 m MV E velocity: 78.00 cm/s  Systemic Diam: 2.20 cm Cherlynn Kaiser MD Electronically signed by Cherlynn Kaiser MD Signature Date/Time: 09/01/2020/11:02:05 AM    Final    VAS [place] LOWER EXTREMITY VENOUS (DVT)  Result Date: 08/24/2020  Lower Venous DVTStudy Indications: Elevated d-dimer, covid.  Comparison Study: No prior studies. Performing Technologist: Darlin Coco  Examination Guidelines: A complete evaluation includes B-mode imaging, spectral Doppler, color Doppler, and power Doppler as needed of all accessible portions of each vessel. Bilateral testing is considered an integral part of a complete examination. Limited examinations for reoccurring indications may be performed as noted. The reflux portion of the exam is performed with the patient in reverse Trendelenburg.  +---------+---------------+---------+-----------+----------+--------------+  RIGHT     Compressibility Phasicity Spontaneity Properties Thrombus Aging  +---------+---------------+---------+-----------+----------+--------------+  CFV       Full            Yes       Yes                                     +---------+---------------+---------+-----------+----------+--------------+  SFJ       Full                                                             +---------+---------------+---------+-----------+----------+--------------+  FV Prox   Full                                                             +---------+---------------+---------+-----------+----------+--------------+  FV Mid    Full                                                             +---------+---------------+---------+-----------+----------+--------------+  FV Distal Full                                                             +---------+---------------+---------+-----------+----------+--------------+  PFV       Full                                                             +---------+---------------+---------+-----------+----------+--------------+  POP       Full            Yes       Yes                                    +---------+---------------+---------+-----------+----------+--------------+  PTV       Full                                                             +---------+---------------+---------+-----------+----------+--------------+  PERO      Full                                                             +---------+---------------+---------+-----------+----------+--------------+   +---------+---------------+---------+-----------+----------+--------------+  LEFT      Compressibility Phasicity Spontaneity Properties Thrombus Aging  +---------+---------------+---------+-----------+----------+--------------+  CFV       Full            Yes       Yes                                    +---------+---------------+---------+-----------+----------+--------------+  SFJ       Full                                                             +---------+---------------+---------+-----------+----------+--------------+  FV Prox   Full                                                              +---------+---------------+---------+-----------+----------+--------------+  FV Mid    Full                                                             +---------+---------------+---------+-----------+----------+--------------+  FV Distal Full                                                             +---------+---------------+---------+-----------+----------+--------------+  PFV       Full                                                             +---------+---------------+---------+-----------+----------+--------------+  POP       Full            Yes       Yes                                    +---------+---------------+---------+-----------+----------+--------------+  PTV       Full                                                             +---------+---------------+---------+-----------+----------+--------------+  PERO      Full                                                             +---------+---------------+---------+-----------+----------+--------------+     Summary: RIGHT: - There is no evidence of deep vein thrombosis in the lower extremity.  - No cystic structure found in the popliteal fossa.  LEFT: - There is no evidence of deep vein thrombosis in the lower extremity.  - No cystic structure found in the popliteal fossa.  *See table(s)  above for measurements and observations. Electronically signed by Deitra Mayo MD on 08/24/2020 at 5:36:23 PM.    Final    [place] EKG SITE RITE  Result Date: 09/03/2020 If Site Rite image not attached, placement could not be confirmed due to current cardiac rhythm.  [place] EKG SITE RITE  Result Date: 09/03/2020 If Site Rite image not attached, placement could not be confirmed due to current cardiac rhythm.

## 2020-09-03 NOTE — Progress Notes (Signed)
Peripherally Inserted Central Catheter Placement  The IV Nurse has discussed with the patient and/or persons authorized to consent for the patient, the purpose of this procedure and the potential benefits and risks involved with this procedure.  The benefits include less needle sticks, lab draws from the catheter, and the patient may be discharged home with the catheter. Risks include, but not limited to, infection, bleeding, blood clot (thrombus formation), and puncture of an artery; nerve damage and irregular heartbeat and possibility to perform a PICC exchange if needed/ordered by physician.  Alternatives to this procedure were also discussed.  Bard Power PICC patient education guide, fact sheet on infection prevention and patient information card has been provided to patient /or left at bedside.    PICC Placement Documentation  PICC Triple Lumen 09/03/20 PICC Right Brachial 38 cm 0 cm (Active)  Indication for Insertion or Continuance of Line Administration of hyperosmolar/irritating solutions (i.e. TPN, Vancomycin, etc.);Prolonged intravenous therapies 09/03/20 1204  Exposed Catheter (cm) 0 cm 09/03/20 1204  Site Assessment Clean;Dry;Intact 09/03/20 1204  Lumen #1 Status Flushed;Saline locked;Blood return noted 09/03/20 1204  Lumen #2 Status Flushed;Saline locked;Blood return noted 09/03/20 1204  Lumen #3 Status Flushed;Saline locked;Blood return noted 09/03/20 1204  Dressing Type Transparent 09/03/20 1204  Dressing Status Clean;Dry;Intact 09/03/20 1204  Antimicrobial disc in place? Yes 09/03/20 1204  Safety Lock Not Applicable 03/50/09 3818  Line Care Connections checked and tightened 09/03/20 1204  Line Adjustment (NICU/IV Team Only) No 09/03/20 1204  Dressing Intervention New dressing 09/03/20 2993  Dressing Change Due 09/10/20 09/03/20 [place], Nicolette Bang 09/03/2020, 12:06 PM

## 2020-09-04 ENCOUNTER — Inpatient Hospital Stay (HOSPITAL_COMMUNITY): Payer: Medicare Other

## 2020-09-04 DIAGNOSIS — I4891 Unspecified atrial fibrillation: Secondary | ICD-10-CM | POA: Diagnosis not present

## 2020-09-04 LAB — BPAM RBC
Blood Product Expiration Date: 202111252359
Blood Product Expiration Date: 202111252359
Blood Product Expiration Date: 202112012359
Blood Product Expiration Date: 202112032359
Blood Product Expiration Date: 202112042359
Blood Product Expiration Date: 202112042359
ISSUE DATE / TIME: 202111031729
ISSUE DATE / TIME: 202111040045
Unit Type and Rh: 5100
Unit Type and Rh: 5100
Unit Type and Rh: 7300
Unit Type and Rh: 7300
Unit Type and Rh: 7300
Unit Type and Rh: 7300

## 2020-09-04 LAB — BPAM FFP
Blood Product Expiration Date: 202111042359
Blood Product Expiration Date: 202111042359
Blood Product Expiration Date: 202111042359
Blood Product Expiration Date: 202111042359
ISSUE DATE / TIME: 202111031545
ISSUE DATE / TIME: 202111031545
ISSUE DATE / TIME: 202111040048
ISSUE DATE / TIME: 202111040048
Unit Type and Rh: 5100
Unit Type and Rh: 5100
Unit Type and Rh: 6200
Unit Type and Rh: 6200

## 2020-09-04 LAB — TYPE AND SCREEN
ABO/RH(D): B POS
Antibody Screen: NEGATIVE
Unit division: 0
Unit division: 0
Unit division: 0
Unit division: 0
Unit division: 0
Unit division: 0

## 2020-09-04 LAB — DIFFERENTIAL
Abs Immature Granulocytes: 0.11 10*3/uL — ABNORMAL HIGH (ref 0.00–0.07)
Basophils Absolute: 0 10*3/uL (ref 0.0–0.1)
Basophils Relative: 0 %
Eosinophils Absolute: 0 10*3/uL (ref 0.0–0.5)
Eosinophils Relative: 0 %
Immature Granulocytes: 1 %
Lymphocytes Relative: 1 %
Lymphs Abs: 0.2 10*3/uL — ABNORMAL LOW (ref 0.7–4.0)
Monocytes Absolute: 0.4 10*3/uL (ref 0.1–1.0)
Monocytes Relative: 3 %
Neutro Abs: 11.7 10*3/uL — ABNORMAL HIGH (ref 1.7–7.7)
Neutrophils Relative %: 95 %

## 2020-09-04 LAB — PREPARE FRESH FROZEN PLASMA
Unit division: 0
Unit division: 0

## 2020-09-04 LAB — COMPREHENSIVE METABOLIC PANEL
ALT: 20 U/L (ref 0–44)
AST: 20 U/L (ref 15–41)
Albumin: 1.9 g/dL — ABNORMAL LOW (ref 3.5–5.0)
Alkaline Phosphatase: 65 U/L (ref 38–126)
Anion gap: 7 (ref 5–15)
BUN: 40 mg/dL — ABNORMAL HIGH (ref 8–23)
CO2: 26 mmol/L (ref 22–32)
Calcium: 9.7 mg/dL (ref 8.9–10.3)
Chloride: 117 mmol/L — ABNORMAL HIGH (ref 98–111)
Creatinine, Ser: 1.35 mg/dL — ABNORMAL HIGH (ref 0.44–1.00)
GFR, Estimated: 43 mL/min — ABNORMAL LOW (ref >60)
Glucose, Bld: 402 mg/dL — ABNORMAL HIGH (ref 70–99)
Potassium: 3.9 mmol/L (ref 3.5–5.1)
Sodium: 150 mmol/L — ABNORMAL HIGH (ref 135–145)
Total Bilirubin: 0.5 mg/dL (ref 0.3–1.2)
Total Protein: 5.4 g/dL — ABNORMAL LOW (ref 6.5–8.1)

## 2020-09-04 LAB — CBC
HCT: 32.7 % — ABNORMAL LOW (ref 36.0–46.0)
Hemoglobin: 10.1 g/dL — ABNORMAL LOW (ref 12.0–15.0)
MCH: 23.2 pg — ABNORMAL LOW (ref 26.0–34.0)
MCHC: 30.9 g/dL (ref 30.0–36.0)
MCV: 75.2 fL — ABNORMAL LOW (ref 80.0–100.0)
Platelets: 291 10*3/uL (ref 150–400)
RBC: 4.35 MIL/uL (ref 3.87–5.11)
RDW: 15.7 % — ABNORMAL HIGH (ref 11.5–15.5)
WBC: 12.4 10*3/uL — ABNORMAL HIGH (ref 4.0–10.5)
nRBC: 0 % (ref 0.0–0.2)

## 2020-09-04 LAB — TRIGLYCERIDES: Triglycerides: 164 mg/dL — ABNORMAL HIGH (ref <150)

## 2020-09-04 LAB — MAGNESIUM: Magnesium: 1.9 mg/dL (ref 1.7–2.4)

## 2020-09-04 LAB — GLUCOSE, CAPILLARY
Glucose-Capillary: 276 mg/dL — ABNORMAL HIGH (ref 70–99)
Glucose-Capillary: 331 mg/dL — ABNORMAL HIGH (ref 70–99)
Glucose-Capillary: 340 mg/dL — ABNORMAL HIGH (ref 70–99)
Glucose-Capillary: 348 mg/dL — ABNORMAL HIGH (ref 70–99)
Glucose-Capillary: 391 mg/dL — ABNORMAL HIGH (ref 70–99)
Glucose-Capillary: 281 mg/dL — ABNORMAL HIGH (ref 70–99)

## 2020-09-04 LAB — PREALBUMIN: Prealbumin: 15.8 mg/dL — ABNORMAL LOW (ref 18–38)

## 2020-09-04 LAB — D-DIMER, QUANTITATIVE: D-Dimer, Quant: 3.64 ug/mL-FEU — ABNORMAL HIGH (ref 0.00–0.50)

## 2020-09-04 LAB — C-REACTIVE PROTEIN: CRP: 6.2 mg/dL — ABNORMAL HIGH (ref <1.0)

## 2020-09-04 LAB — PHOSPHORUS: Phosphorus: 2.7 mg/dL (ref 2.5–4.6)

## 2020-09-04 MED ORDER — INSULIN GLARGINE 100 UNIT/ML ~~LOC~~ SOLN
20.0000 [IU] | Freq: Every day | SUBCUTANEOUS | Status: DC
  Filled 2020-09-04: qty 0.2

## 2020-09-04 MED ORDER — INSULIN DETEMIR 100 UNIT/ML ~~LOC~~ SOLN
10.0000 [IU] | Freq: Two times a day (BID) | SUBCUTANEOUS | Status: DC
  Administered 2020-09-04 (×2): 10 [IU] via SUBCUTANEOUS
  Filled 2020-09-04 (×4): qty 0.1

## 2020-09-04 MED ORDER — METOPROLOL TARTRATE 5 MG/5ML IV SOLN
2.5000 mg | Freq: Four times a day (QID) | INTRAVENOUS | Status: DC
  Administered 2020-09-05 (×2): 2.5 mg via INTRAVENOUS
  Filled 2020-09-04: qty 5

## 2020-09-04 MED ORDER — METHYLPREDNISOLONE SODIUM SUCC 40 MG IJ SOLR: 40.0000 mg | Freq: Every day | INTRAMUSCULAR | Status: DC

## 2020-09-04 MED ORDER — SODIUM CHLORIDE 0.45 % IV SOLN: INTRAVENOUS | Status: DC

## 2020-09-04 MED ORDER — ACETAMINOPHEN 10 MG/ML IV SOLN
1000.0000 mg | Freq: Four times a day (QID) | INTRAVENOUS | Status: DC
  Administered 2020-09-04 (×3): 1000 mg via INTRAVENOUS
  Filled 2020-09-04 (×4): qty 100

## 2020-09-04 MED ORDER — FREE WATER
250.0000 mL | Freq: Four times a day (QID) | Status: DC
  Administered 2020-09-04: 250 mL

## 2020-09-04 MED ORDER — TRAVASOL 10 % IV SOLN
INTRAVENOUS | Status: AC
  Filled 2020-09-04: qty 547.2

## 2020-09-04 MED ORDER — INSULIN ASPART 100 UNIT/ML ~~LOC~~ SOLN
0.0000 [IU] | SUBCUTANEOUS | Status: DC
  Administered 2020-09-04: 8 [IU] via SUBCUTANEOUS
  Administered 2020-09-04: 15 [IU] via SUBCUTANEOUS
  Administered 2020-09-04: 8 [IU] via SUBCUTANEOUS
  Administered 2020-09-05: 5 [IU] via SUBCUTANEOUS
  Administered 2020-09-05: 8 [IU] via SUBCUTANEOUS

## 2020-09-04 MED ORDER — DEXTROSE 5 % IV SOLN: INTRAVENOUS | Status: DC

## 2020-09-04 NOTE — Progress Notes (Signed)
[facility] Progress Note  3 Days Post-Op  Subjective: CC-  NG tube came out yesterday and was replaced by RN. It is currently not to suction. Patient complaining of heart burn. She was given maalox PO despite strict NPO order. Denies worsening abdominal pain. WBC down 12.4, afebrile.  Objective: Vital signs in last 24 hours: Temp:  [97.7 F (36.5 C)-98.6 F (37 C)] 97.9 F (36.6 C) (11/06 0752) Pulse Rate:  [79-101] 100 (11/06 0752) Resp:  [14-23] 14 (11/06 0752) BP: (100-127)/(55-66) 122/56 (11/06 0752) SpO2:  [98 %-100 %] 100 % (11/06 0752) Last BM Date: 09/03/20  Intake/Output from previous day: 11/05 0701 - 11/06 0700 In: 0  Out: 875 [Urine:850; Emesis/NG output:25] Intake/Output this shift: No intake/output data recorded.  PE: Gen: Alert, NAD, pleasant HEENT: EOM's intact, pupils equal and round Card:Irregular rhythm, HR 80-90s Pulm: rate and effort normal on 2L [place] EYC:XKGY, appropriately tender on incision and drain, vac to midline incision, drain with SS output  Lab Results:  Recent Labs    09/03/20 0054 09/04/20 0514  WBC 18.4* 12.4*  HGB 8.9* 10.1*  HCT 28.7* 32.7*  PLT 255 291   BMET Recent Labs    09/03/20 0054 09/04/20 0514  NA 147* 150*  K 4.1 3.9  CL 116* 117*  CO2 22 26  GLUCOSE 170* 402*  BUN 43* 40*  CREATININE 1.51* 1.35*  CALCIUM 9.5 9.7   PT/INR No results for input(s): LABPROT, INR in the last 72 hours. CMP     Component Value Date/Time   NA 150 (H) 09/04/2020 0514   K 3.9 09/04/2020 0514   CL 117 (H) 09/04/2020 0514   CO2 26 09/04/2020 0514   GLUCOSE 402 (H) 09/04/2020 0514   BUN 40 (H) 09/04/2020 0514   CREATININE 1.35 (H) 09/04/2020 0514   CALCIUM 9.7 09/04/2020 0514   PROT 5.4 (L) 09/04/2020 0514   ALBUMIN 1.9 (L) 09/04/2020 0514   AST 20 09/04/2020 0514   ALT 20 09/04/2020 0514   ALKPHOS 65 09/04/2020 0514   BILITOT 0.5 09/04/2020 0514   GFRNONAA 43 (L) 09/04/2020 0514   Lipase     Component  Value Date/Time   LIPASE 48 08/31/2020 2054       Studies/Results: DG CHEST PORT 1 VIEW  Result Date: 09/03/2020 CLINICAL DATA:  PICC line placement.  COVID 19 positive. EXAM: PORTABLE CHEST 1 VIEW COMPARISON:  Chest x-ray dated August 31, 2020. FINDINGS: New right upper extremity PICC line with the tip in the distal SVC. Enteric tube tip at the gastroesophageal junction. Stable cardiomediastinal silhouette. Bilateral mid and lower lung patchy opacities are not significantly changed. Postsurgical changes in the right upper lobe again noted. No pleural effusion or pneumothorax. No acute osseous abnormality. IMPRESSION: 1. New right upper extremity PICC line with tip in the distal SVC. 2. Enteric tube tip at the gastroesophageal junction. Recommend advancement. 3. Unchanged multifocal pneumonia. Electronically Signed   By: Titus Dubin M.D.   On: 09/03/2020 13:00   DG Abd Portable 1V  Result Date: 09/04/2020 CLINICAL DATA:  NG tube placement EXAM: PORTABLE ABDOMEN - 1 VIEW COMPARISON:  03/03/2020 FINDINGS: NG tube has been advanced slightly with the tip in the proximal stomach. The side port is in the distal esophagus just above the GE junction. Bilateral lower lobe airspace opacities. IMPRESSION: NG tube tip in the proximal stomach with the side port in the distal esophagus. Electronically Signed   By: Rolm Baptise M.D.   On: 09/04/2020  02:08   DG Abd Portable 1V  Result Date: 09/03/2020 CLINICAL DATA:  Check gastric catheter placement EXAM: PORTABLE ABDOMEN - 1 VIEW COMPARISON:  None. FINDINGS: Gastric catheter is noted in the distal esophagus just short of the gastroesophageal junction. This should be advanced several cm deeper into the stomach. If it has been advanced since the prior chest x-ray from 1242 hours there may be looping in the pharynx. Clinical correlation is recommended. Patchy airspace disease is again identified in both lungs. IMPRESSION: Gastric catheter stable in appearance.  This should be advanced several cm as described. Electronically Signed   By: Inez Catalina M.D.   On: 09/03/2020 23:38   [place] EKG SITE RITE  Result Date: 09/03/2020 If Site Rite image not attached, placement could not be confirmed due to current cardiac rhythm.  [place] EKG SITE RITE  Result Date: 09/03/2020 If Site Rite image not attached, placement could not be confirmed due to current cardiac rhythm.   Anti-infectives: Anti-infectives (From admission, onward)   Start     Dose/Rate Route Frequency Ordered Stop   09/02/20 1600  cefTRIAXone (ROCEPHIN) 1 g in sodium chloride 0.9 % 100 mL IVPB  Status:  Discontinued        1 g 200 mL/hr over 30 Minutes Intravenous Every 24 hours 09/01/20 1811 09/02/20 0838   09/01/20 1800  fluconazole (DIFLUCAN) IVPB 400 mg        400 mg 50 mL/hr over 240 Minutes Intravenous  Once 09/01/20 1749 09/02/20 0603   09/01/20 1530  piperacillin-tazobactam (ZOSYN) IVPB 3.375 g        3.375 g 12.5 mL/hr over 240 Minutes Intravenous Every 8 hours 09/01/20 1514 09/05/20 2359   09/01/20 1000  levofloxacin (LEVAQUIN) tablet 250 mg  Status:  Discontinued        250 mg Oral Daily 08/31/20 1508 08/31/20 1735   08/31/20 1730  cefTRIAXone (ROCEPHIN) 1 g in sodium chloride 0.9 % 100 mL IVPB  Status:  Discontinued        1 g 200 mL/hr over 30 Minutes Intravenous Every 24 hours 08/31/20 1726 09/01/20 1513   08/31/20 1730  azithromycin (ZITHROMAX) 500 mg in sodium chloride 0.9 % 250 mL IVPB  Status:  Discontinued        500 mg 250 mL/hr over 60 Minutes Intravenous Every 24 hours 08/31/20 1726 09/02/20 0838       Assessment/Plan HTN HLD DM Hx colon cancer s/p partial colectomy/colostomy followed by completion colectomy and ileostomy Hypoxia requiring 3L O2 Covid-19 pneumonia  Elevated D dimer on eliquis -eliquis reversed with kcentra, hold eliquis A fib RVR Anemia - Hgb 10.1 from 8.9, stable 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 Bobbye Morton -POD#3 - Continue NPO/NGT to LIWS, nothing per NG - plan for UGI on POD#5 prior to removing NG or advancing diet - continue PPI BID - plan for 4 days IV zosyn - continue JP drain and monitor output - vac MWF - continue PT/OT, mobilize. Therapies currently recommending SNF but patient/family have refused in the past - pain control: scheduled IV tylenol, IV robaxin PRN, IV morphine PRN  ID -zosyn 11/3>>day#3/4 VTE -therapeutic lovenox FEN -IVF, NPO/NGT to LIWS Foley -d/c 11/5 Follow up -Dr. Chrystie Nose - daughter [name] [phone]   LOS: 3 days    Wellington [place], [facility] 09/04/2020, 8:26 AM Please see  Amion for pager number during day hours 7:00am-4:30pm

## 2020-09-04 NOTE — Progress Notes (Signed)
Sent Dr. Candiss Norse message requesting Zofran IV.  Heard alarm going off in pt. Room, and RN is on lunch break.  Found pt. vomitting and tachy in the 140's.  When vomitting stopped pt. Heard rate returned to mid 90's.

## 2020-09-04 NOTE — Progress Notes (Signed)
PHARMACY - TOTAL PARENTERAL NUTRITION CONSULT NOTE   Indication: Not tolerating enteral feeds  Patient Measurements: Height: 5\' 8"  (172.7 cm) Weight: 99.7 kg (219 lb 12.8 oz) IBW/kg (Calculated) : 63.9 TPN AdjBW (KG): 72.9 Body mass index is 33.42 kg/m.  Assessment: Candace [name] is a 69yo female PMH HTN, HLD, DM currently admitted to [facility] with covid-19. She was admitted 10/25 through 10/31 with covid-19, discharged home on supplemental oxygen and on eliquis due to elevated d-dimer. She returned to the ED yesterday when her home health nurse was concerned about elevated heart rate. She was found to be in Afib RVR and is now on cardizem drip with HR 90's. Late last night she developed abdominal pain that progressively got worse today. CT scan was obtained and shows bowel perforation, pneumoperitoneum and intermediate density free fluid in the abdomen. Of note, patient has a prior h/o partial colectomy/colostomy for colon cancer, completion colectomy and ileostomy. S/pexploratory laparotomy, adhesiolysis, modified graham patch repair of perforated duodenal ulcer11/3.  Pharmacy consulted to start TPN due to baseline malnutrition and likely prolonged inability to tolerate PO feeds.   Glucose / Insulin: Currently on sensitive SSI - required 22 units/12h since TPN hung. CBGs 220-340 and will limit TPN rate progression until more controlled. Worsened by steroids - reduced 11/5 and reducing again 11/6 to 40 mg/day. Discussed with MD - will add levemir and increase SSI to moderate Electrolytes: Na 150, K 3.9, Cl elevated at 117, HCO3 26 Renal: Scr down 1.34 << 1.51, BUN 40 LFTs / TGs: WNL Prealbumin / albumin: 15.8 (11/6), alb 1.9 Intake / Output; MIVF: UOP 0.8 ml/kg/hr; LR @ 75 ml/hr, drain 30 mls, stool 250 mls GI Imaging: None since TPN start Surgeries / Procedures:  11/3 exploratory laparotomy, adhesiolysis, modified graham patch repair of perforated duodenal ulcer  Central access: Triple lumen  PICC placed 11/5 TPN start date: 11/5  Nutritional Goals (per RD on 11/5): kCal: 2200-2450, Protein: 115-130gms , Fluid >/=2L/day Goal TPN rate is 90 mL/hr (provides 123 g of protein and 2293 kcals per day)  Current Nutrition:  None  Plan:  Continue TPN at 40 mL/hr for now ( rate limited for now until CBGs can be better controlled). TPN will provide 1020 kcal and 54g protein meeting ~46% of estimated needs.  Electrolytes in TPN: 0 mEq/L of Na, 50 mEq/L of K, 15mEq/L of Ca, 34mEq/L of Mg, and 10 mmol/L of Phos, max acetate Start levemir 10 units bid - discussed with MD, will attempt to manage outside of TPN bag due to concurrent steroids given that they may be weaned and/or stopped Add standard MVI and trace elements to TPN  Adjust to moderate SSI q4hr and adjust as needed  Adjust IVF from D5W to 1/2 NS at 100 cc/hr per MD - to complete intended 24h duration, then off Monitor TPN labs on tomorrow and Mon/Thurs  Thank you for allowing pharmacy to be a part of this patient's care.  Alycia Rossetti, PharmD, BCPS Clinical Pharmacist Clinical phone for 09/04/2020: [phone] 09/04/2020 9:51 AM   **Pharmacist phone directory can now be found on [email or web address] ([email or web address]).  Listed under [place].

## 2020-09-04 NOTE — Progress Notes (Addendum)
1932: Surgery service paged, pt has orders for free water per NG and metoprolol. Surgery no states nothing per tube, orders need clarification. Pt has hx of afib w/ RVR  1940: Dr. Brantley Stage returned page, follow up with med service regarding orders. Pt is to have nothing in the tube.  Dr. Cyd Silence changed metoprolol to 2.5mg  IV Q6H, removed free water order.

## 2020-09-04 NOTE — Progress Notes (Addendum)
1510:call placed to surgery regarding vomiting, awaiting call back  1550:second call placed, awaiting call back  1600:call placed to answering service awaiting call back

## 2020-09-04 NOTE — Progress Notes (Signed)
PROGRESS NOTE                                                                                                                                                                                                             Patient Demographics:    Paeton Latouche, is a 69 y.o. female, DOB - [birth year], ZYS:[id number]  Outpatient Primary MD for the patient is [name], [name], MD   Admit date - 08/31/2020   LOS - 3  CC - fast Heart rate     Brief Narrative: Patient is a 69 y.o. female was recently hospitalized for COVID-19 pneumonia-and discharged on 2-3 L of home O2-presents with worsening shortness of breath-found to have A. fib with RVR, ?? worsening hypoxia requiring up to 6 L of oxygen ( down to 2 - 3 lits within 12 hrs) and subsequently admitted to the hospitalist service.  Post admission-patient developed severe upper abdominal pain-subsequent further imaging studies revealed perforated viscus-she underwent a laparotomy on 11/3 which showed a perforated duodenal ulcer.  See below for further details.  COVID-19 vaccinated status: Unvaccinated  Significant Events: 10/25-10/31>> hospitalization for COVID-19 pneumonia-discharged on 2-3 L of oxygen. 11/2>> Admit to [facility] for worsening hypoxemia requiring 6 L of oxygen, A. fib with RVR 11/3>> developed abdominal pain-CT abdomen positive for perforated viscus-underwent emergent laparotomy  Significant studies: 10/26>> bilateral lower extremity Doppler: No DVT. 10/27>> renal ultrasound: Cystic changes at the renal hila likely parapelvic cysts.  Hydronephrosis felt to be less likely. 11/2>>Chest x-ray: Persistent bilateral pulmonary infiltrates consistent with multifocal pneumonia 11/2>> CTA chest: No PE, areas of consolidation and groundglass density bilaterally. 11/3>> Echo: EF 70-75% 11/3>> CT abdomen/pelvis: Pneumoperitoneum/intermediate density free fluid in the abdomen-positive for  bowel perforation.  COVID-19 medications: Steroids: Resume 11/2>> Remdesivir: 10/25>> 10/29  Antibiotics: Rocephin: 11/2 x1 Zithromax: 11/12>> 11/3 Zosyn: 11/3>>  Microbiology data: 10/25 >>blood culture: No growth  Procedures: 11/12>> ex lap-modified Phillip Heal patch repair of perforated duodenal ulcer.  Consults: None  DVT prophylaxis: Place TED hose Start: 08/31/20 1711 Place TED hose Start: 08/31/20 1507 Started on therapeutic Lovenox on 11/4    Subjective:   Patient in bed, appears comfortable, denies any headache, no fever, no chest pain or pressure, no shortness of breath , no abdominal pain. No focal weakness.    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  Doubt bacterial pneumonia-procalcitonin levels are normal-she is on Zosyn to cover her intra-abdominal issues.  Continue Solu-Medrol-but taper down to 40 mg daily-CRP is elevated-but this is likely more from surgical issues rather than worsening of her COVID-19 inflammation.    Fever: afebrile O2 requirements:  SpO2: 100 % O2 Flow Rate (L/min): 3 L/min   Prone/Incentive Spirometry: encouraged incentive spirometry use 3-4/hour.    Recent Labs  Lab 08/29/20 0323 08/29/20 0323 08/31/20 1315 08/31/20 1315 08/31/20 1922 09/01/20 0043 09/01/20 0043 09/01/20 0718 09/01/20 1643 09/01/20 1817 09/02/20 0313 09/03/20 0054 09/04/20 0514  WBC 12.8*   < > 14.5*  --   --  11.6*  --   --   --   --  20.6* 18.4* 12.4*  HGB 13.6   < > 13.4   < >  --  12.1   < >  --  12.6 10.5* 9.5* 8.9* 10.1*  HCT 43.6   < > 42.4   < >  --  39.5   < >  --  37.0 31.0* 30.8* 28.7* 32.7*  PLT 366   < > 365  --   --  324  --   --   --   --  280 255 291  CRP 3.8*  --   --   --  9.1*  --   --   --   --   --  12.4* 11.7* 6.2*  BNP 51.0  --   --   --   --   --   --   --   --   --   --   --   --   DDIMER 2.04*  --   --   --   --   --   --  2.53*   --   --  2.96* 7.49* 3.64*  PROCALCITON 0.36  --   --   --   --   --   --  <0.10  --   --   --   --   --   AST 29  --  29  --   --   --   --   --   --   --  17 17 20   ALT 25  --  21  --   --   --   --   --   --   --  14 13 20   ALKPHOS 59  --  57  --   --   --   --   --   --   --  40 46 65  BILITOT 0.6  --  1.2  --   --   --   --   --   --   --  0.6 0.7 0.5  ALBUMIN 2.7*  --  2.5*  --   --   --   --   --   --   --  1.9* 1.8* 1.9*   < > = values in this interval not displayed.       Elevated D-dimer: Secondary surgical issues rather than COVID-19-irrespective is on therapeutic Lovenox.  Paroxysmal atrial fibrillation with RVR: Remains in atrial fibrillation-rate controlled with Cardizem-oral metoprolol/Eliquis on hold as patient n.p.o.  After discussion with general surgery-have started SQ Lovenox.  CHA2DS2-VASc score of at least 3, echo with preserved EF.  Once stable for oral intake-we will  attempt to slowly put her back on oral rate control agents.  AKI on CKD stage IIIb: AKI hemodynamically mediated-improved after hydration and close to baseline.  Perforated duodenal ulcer s/p exploratory laparotomy on 11/3: Remains n.p.o.-NG tube/JP drain in place-General surgery following and directing care.  Remains on PPI twice daily.  General surgery planning on starting TNA.  Anemia: Likely secondary to acute/critical illness-follow.  Hypernatremia.  Free water flushes added and gentle half-normal saline.    HLD: Hold statin as patient n.p.o.  DM-2 (A1c 7.0 on 10/26) with uncontrolled hyperglycemia due to steroids: TNA adjusted, added Levemir, continue sliding scale and monitor.  Steroids being tapered.  Recent Labs    09/04/20 0007 09/04/20 0422 09/04/20 0758  GLUCAP 276* 331* 340*    History of colon cancer-s/p colostomy  Obesity: Estimated body mass index is 33.42 kg/m as calculated from the following:   Height as of this encounter: 5\' 8"  (1.727 m).   Weight as of this encounter:  99.7 kg.    GI prophylaxis: PPI      Condition -Stable  Family Communication  :  Daughter [name] [phone]) updated over the phone 11/5  Code Status :  Full Code Diet :  Diet Order            Diet NPO time specified Except for: Ice Chips  Diet effective now                  Disposition Plan  :   The patient will require care spanning > 2 midnights and should be moved to inpatient because: Inpatient level of care appropriate due to severity of illness  Dispo: The patient is from: Home              Anticipated d/c is to: Home              Anticipated d/c date is: > 3 days              Patient currently is not medically stable to d/c.    Barriers to discharge: Hypoxia requiring O2 supplementation-perforated duodenal ulcer-s/p exploratory laparotomy on 11/3-remains n.p.o. with NG tube/JP drains in place.  Antimicorbials  :    Anti-infectives (From admission, onward)   Start     Dose/Rate Route Frequency Ordered Stop   09/02/20 1600  cefTRIAXone (ROCEPHIN) 1 g in sodium chloride 0.9 % 100 mL IVPB  Status:  Discontinued        1 g 200 mL/hr over 30 Minutes Intravenous Every 24 hours 09/01/20 1811 09/02/20 0838   09/01/20 1800  fluconazole (DIFLUCAN) IVPB 400 mg        400 mg 50 mL/hr over 240 Minutes Intravenous  Once 09/01/20 1749 09/02/20 0603   09/01/20 1530  piperacillin-tazobactam (ZOSYN) IVPB 3.375 g        3.375 g 12.5 mL/hr over 240 Minutes Intravenous Every 8 hours 09/01/20 1514 09/05/20 2359   09/01/20 1000  levofloxacin (LEVAQUIN) tablet 250 mg  Status:  Discontinued        250 mg Oral Daily 08/31/20 1508 08/31/20 1735   08/31/20 1730  cefTRIAXone (ROCEPHIN) 1 g in sodium chloride 0.9 % 100 mL IVPB  Status:  Discontinued        1 g 200 mL/hr over 30 Minutes Intravenous Every 24 hours 08/31/20 1726 09/01/20 1513   08/31/20 1730  azithromycin (ZITHROMAX) 500 mg in sodium chloride 0.9 % 250 mL IVPB  Status:  Discontinued  500 mg 250 mL/hr over  60 Minutes Intravenous Every 24 hours 08/31/20 1726 09/02/20 0838      Inpatient Medications  Scheduled Meds: . Chlorhexidine Gluconate Cloth  6 each Topical Daily  . enoxaparin (LOVENOX) injection  100 mg Subcutaneous Q12H  . free water  250 mL Per Tube Q6H  . insulin aspart  0-15 Units Subcutaneous Q4H  . insulin glargine  20 Units Subcutaneous Daily  . [START ON 09/05/2020] methylPREDNISolone (SOLU-MEDROL) injection  40 mg Intravenous Daily  . metoprolol tartrate  25 mg Per Tube BID  . pantoprazole (PROTONIX) IV  40 mg Intravenous Q12H   Continuous Infusions: . sodium chloride    . acetaminophen    . diltiazem (CARDIZEM) infusion 7.5 mg/hr (09/03/20 1701)  . piperacillin-tazobactam (ZOSYN)  IV 3.375 g (09/04/20 0858)  . TPN ADULT (ION) 40 mL/hr at 09/03/20 1746   PRN Meds:.albuterol, metoprolol tartrate, morphine injection, phenol, sodium chloride, sodium chloride flush   Time Spent in minutes  25    See all Orders from today for further details   Lala Lund M.D on 09/04/2020 at 10:14 AM  To page go to [email or web address] - use universal password  [facility] -  Office  [phone]    Objective:   Vitals:   09/03/20 2003 09/04/20 0010 09/04/20 0400 09/04/20 0752  BP: (!) 127/56 111/66 100/64 (!) 122/56  Pulse: 94 90 79 100  Resp: (!) 23 (!) 22 18 14   Temp: 98 F (36.7 C) 98.3 F (36.8 C) 98.2 F (36.8 C) 97.9 F (36.6 C)  TempSrc: Oral Axillary Axillary Oral  SpO2: 98% 100% 100% 100%  Weight:      Height:        Wt Readings from Last 3 Encounters:  08/31/20 99.7 kg  08/23/20 99.8 kg     Intake/Output Summary (Last 24 hours) at 09/04/2020 1014 Last data filed at 09/04/2020 0441 Gross per 24 hour  Intake 0 ml  Output 875 ml  Net -875 ml     Physical Exam  Awake Alert, No new F.N deficits, NG tube in place, right arm PICC line [place].AT,PERRAL Supple Neck,No JVD, No cervical lymphadenopathy appriciated.  Symmetrical Chest wall movement, Good  air movement bilaterally, CTAB RRR,No Gallops, Rubs or new Murmurs, No Parasternal Heave +ve B.Sounds, Abd Soft, No tenderness, No organomegaly appriciated, No rebound - guarding or rigidity. No Cyanosis, Clubbing or edema, No new Rash or bruise    Data Review:    CBC Recent Labs  Lab 08/29/20 0323 08/29/20 0323 08/31/20 1315 08/31/20 1315 09/01/20 0043 09/01/20 0043 09/01/20 1643 09/01/20 1817 09/02/20 0313 09/03/20 0054 09/04/20 0514  WBC 12.8*   < > 14.5*  --  11.6*  --   --   --  20.6* 18.4* 12.4*  HGB 13.6   < > 13.4   < > 12.1   < > 12.6 10.5* 9.5* 8.9* 10.1*  HCT 43.6   < > 42.4   < > 39.5   < > 37.0 31.0* 30.8* 28.7* 32.7*  PLT 366   < > 365  --  324  --   --   --  280 255 291  MCV 73.6*   < > 73.2*  --  76.1*  --   --   --  74.0* 74.5* 75.2*  MCH 23.0*   < > 23.1*  --  23.3*  --   --   --  22.8* 23.1* 23.2*  MCHC 31.2   < > 31.6  --  30.6  --   --   --  30.8 31.0 30.9  RDW 15.4   < > 15.1  --  15.3  --   --   --  15.3 15.5 15.7*  LYMPHSABS 0.4*  --  0.4*  --   --   --   --   --   --   --  0.2*  MONOABS 1.4*  --  1.2*  --   --   --   --   --   --   --  0.4  EOSABS 0.0  --  0.0  --   --   --   --   --   --   --  0.0  BASOSABS 0.0  --  0.0  --   --   --   --   --   --   --  0.0   < > = values in this interval not displayed.    Chemistries  Recent Labs  Lab 08/29/20 0323 08/29/20 0323 08/31/20 1315 08/31/20 1315 09/01/20 0043 09/01/20 0043 09/01/20 1643 09/01/20 1817 09/02/20 0313 09/03/20 0054 09/04/20 0514  NA 143   < > 136   < > 142   < > 140 143 144 147* 150*  K 4.7   < > 4.9   < > 5.1   < > 3.7 3.7 4.2 4.1 3.9  CL 108   < > 102  --  110  --   --   --  114* 116* 117*  CO2 25   < > 19*  --  19*  --   --   --  18* 22 26  GLUCOSE 269*   < > 316*  --  312*  --   --   --  242* 170* 402*  BUN 56*   < > 60*  --  45*  --   --   --  42* 43* 40*  CREATININE 1.45*   < > 1.69*  --  1.51*  --   --   --  1.53* 1.51* 1.35*  CALCIUM 10.4*   < > 10.1  --  9.4  --    --   --  8.8* 9.5 9.7  MG 1.7  --  1.8  --   --   --   --   --   --   --  1.9  AST 29  --  29  --   --   --   --   --  17 17 20   ALT 25  --  21  --   --   --   --   --  14 13 20   ALKPHOS 59  --  57  --   --   --   --   --  40 46 65  BILITOT 0.6  --  1.2  --   --   --   --   --  0.6 0.7 0.5   < > = values in this interval not displayed.   ------------------------------------------------------------------------------------------------------------------ Recent Labs    09/04/20 0514  TRIG 164*    Lab Results  Component Value Date   HGBA1C 7.0 (H) 08/24/2020   ------------------------------------------------------------------------------------------------------------------ No results for input(s): TSH, T4TOTAL, T3FREE, THYROIDAB in the last 72 hours.  Invalid input(s): FREET3 ------------------------------------------------------------------------------------------------------------------ No results for input(s): VITAMINB12, FOLATE, FERRITIN, TIBC, IRON, RETICCTPCT in the last 72 hours.  Coagulation profile No results for input(s): INR, PROTIME in the last  168 hours.  Recent Labs    09/03/20 0054 09/04/20 0514  DDIMER 7.49* 3.64*    Cardiac Enzymes No results for input(s): CKMB, TROPONINI, MYOGLOBIN in the last 168 hours.  Invalid input(s): CK ------------------------------------------------------------------------------------------------------------------    Component Value Date/Time   BNP 51.0 08/29/2020 0323    Micro Results No results found for this or any previous visit (from the past 240 hour(s)).  Radiology Reports CT ABDOMEN PELVIS WO CONTRAST  Addendum Date: 09/01/2020   ADDENDUM REPORT: 09/01/2020 14:53 ADDENDUM: Critical Value/emergent results were called by telephone at the time of interpretation on 09/01/2020 at 1447 hours to Dr. Oren Binet , who verbally acknowledged these results. Electronically Signed   By: Genevie Ann M.D.   On: 09/01/2020 14:53    Result Date: 09/01/2020 CLINICAL DATA:  69 year old female with abdominal pain. COVID-19. History of colon cancer status post resection. EXAM: CT ABDOMEN AND PELVIS WITHOUT CONTRAST TECHNIQUE: Multidetector CT imaging of the abdomen and pelvis was performed following the standard protocol without IV contrast. COMPARISON:  CTA chest yesterday.  CT Abdomen and Pelvis 06/07/2014. FINDINGS: Lower chest: Extensive lower lung pneumonia. No pericardial or pleural effusion. Hepatobiliary: Pneumoperitoneum and intermediate density free fluid adjacent to the liver. Vicarious contrast excretion to the gallbladder. Grossly normal liver parenchyma. Pancreas: Negative noncontrast pancreas. Spleen: Negative. Adrenals/Urinary Tract: Stable low-density bilateral adrenal adenomas since 2015. Chronic renal parapelvic cysts redemonstrated. No definite hydronephrosis. No acute perinephric stranding. Both ureters are decompressed. Excreted IV contrast in the urinary bladder. Stomach/Bowel: Left lower quadrant ostomy seen in 2015 has been taken down, along with completion total colectomy since that time. 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 There is abnormal free air and intermediate density free fluid in the upper abdomen. Free air continues in the ventral small bowel mesentery toward the pelvis. And free air tracks along the right ileostomy loop. Oral contrast was administered, and has not leaked from the stomach, duodenum, or proximal jejunum. No abnormally dilated loops. And the specific site of bowel perforation is unclear. There are several small bowel loops which might be adhered to the ventral abdominal wall abutting the greater curve of the stomach. Vascular/Lymphatic: Vascular patency is not evaluated in the absence of IV contrast. Mild Calcified aortic atherosclerosis. Normal caliber abdominal aorta. No lymphadenopathy. Reproductive: Retroverted uterus suspected, with surgically absent rectum. Other: Moderate pelvic  free fluid with simple fluid density, similar to the 2015 CT. Distal colon appear surgically absent. Musculoskeletal: Advanced disc and endplate degeneration throughout the visible spine. No acute or suspicious osseous lesion identified. IMPRESSION: 1. Positive for bowel perforation: Pneumoperitoneum and intermediate density free fluid in the abdomen. Prior total colectomy. The specific site of perforation is unclear-oral contrast present to the proximal jejunum has not obviously leaked. Note that there may be small bowel loops adherent to the ventral abdominal wall along the greater curve of the stomach. 2. Extensive bilateral lower lung pneumonia. No pleural effusion. 3. Other abdominal and pelvic viscera are stable since 2015, including bilateral adrenal adenomas. Chronic renal parapelvic cysts. 4. Aortic Atherosclerosis (ICD10-I70.0). Electronically Signed: By: Genevie Ann M.D. On: 09/01/2020 14:40   CT Angio Chest PE W and/or Wo Contrast  Result Date: 08/31/2020 CLINICAL DATA:  Hypoxia, positive COVID EXAM: CT ANGIOGRAPHY CHEST WITH CONTRAST TECHNIQUE: Multidetector CT imaging of the chest was performed using the standard protocol during bolus administration of intravenous contrast. Multiplanar CT image reconstructions and MIPs were obtained to evaluate the vascular anatomy. CONTRAST:  91mL OMNIPAQUE IOHEXOL 350 MG/ML SOLN  COMPARISON:  None. FINDINGS: Cardiovascular: Satisfactory opacification of the pulmonary arteries to the segmental level. No evidence of pulmonary embolism. Normal heart size. No pericardial effusion. Mediastinum/Nodes: No enlarged lymph nodes. Lungs/Pleura: Bilateral areas of consolidation and ground-glass density with a lower lobe predominance. No pleural effusion. No pneumothorax. Upper Abdomen: Partially imaged left adrenal nodule was present on 2015 abdominal CT. Right adrenal nodule was also present in 2015. Musculoskeletal: Degenerative changes of the included spine. Review of the MIP  images confirms the above findings. IMPRESSION: No evidence of acute pulmonary embolism. COVID-19 pneumonia. Electronically Signed   By: Macy Mis M.D.   On: 08/31/2020 16:19   US RENAL  Result Date: 08/25/2020 CLINICAL DATA:  Acute kidney injury. EXAM: RENAL / URINARY TRACT ULTRASOUND COMPLETE COMPARISON:  Abdominal CT 06/07/2014 FINDINGS: Right Kidney: Renal measurements: 10.7 x 5.2 x 5.0 cm = volume: 146 mL. Cystic changes at the renal hila likely parapelvic cysts as seen on prior CT. Normal renal parenchymal echogenicity. No shadowing calculus. No solid renal mass. Left Kidney: Renal measurements: 10.7 x 6.2 x 5.7 cm = volume: 197 mL. Cystic changes at the renal hila likely parapelvic cyst is seen on prior CT. Normal renal parenchymal echogenicity. No shadowing calculus. No solid renal mass. Bladder: Appears normal for degree of bladder distention. Both ureteral jets are seen. Other: None. IMPRESSION: Cystic changes at the renal hila likely parapelvic cysts as seen on prior CT. Mild bilateral hydronephrosis is felt less likely as both ureteral jets are seen in the bladder. Electronically Signed   By: Keith Rake M.D.   On: 08/25/2020 01:55   DG CHEST PORT 1 VIEW  Result Date: 09/03/2020 CLINICAL DATA:  PICC line placement.  COVID 19 positive. EXAM: PORTABLE CHEST 1 VIEW COMPARISON:  Chest x-ray dated August 31, 2020. FINDINGS: New right upper extremity PICC line with the tip in the distal SVC. Enteric tube tip at the gastroesophageal junction. Stable cardiomediastinal silhouette. Bilateral mid and lower lung patchy opacities are not significantly changed. Postsurgical changes in the right upper lobe again noted. No pleural effusion or pneumothorax. No acute osseous abnormality. IMPRESSION: 1. New right upper extremity PICC line with tip in the distal SVC. 2. Enteric tube tip at the gastroesophageal junction. Recommend advancement. 3. Unchanged multifocal pneumonia. Electronically Signed   By:  Titus Dubin M.D.   On: 09/03/2020 13:00   DG Chest Portable 1 View  Result Date: 08/31/2020 CLINICAL DATA:  Dyspnea at home health check up this morning, had COVID-19 diagnosis 10 days prior, recent discharge, new hypoxia EXAM: PORTABLE CHEST 1 VIEW COMPARISON:  Portable exam 1237 hours compared to 08/27/2020 FINDINGS: Normal heart size, mediastinal contours, and pulmonary vascularity. Atherosclerotic calcification aorta. Persistent infiltrates identified in the mid to lower lungs bilaterally consistent with multifocal pneumonia and COVID-19. Appearance unchanged from previous exam. No pleural effusion or pneumothorax. IMPRESSION: Persistent BILATERAL pulmonary infiltrates consistent with multifocal pneumonia and COVID-19. Electronically Signed   By: Lavonia Dana M.D.   On: 08/31/2020 12:45   DG Chest Port 1 View  Result Date: 08/27/2020 CLINICAL DATA:  Shortness of breath EXAM: PORTABLE CHEST 1 VIEW COMPARISON:  08/25/2020 FINDINGS: No significant interval change in AP portable examination with heterogeneous bibasilar airspace opacity and possible small layering pleural effusions. No new airspace opacity. Cardiomegaly. IMPRESSION: No significant interval change in AP portable examination with heterogeneous bibasilar airspace opacity and possible small layering pleural effusions, consistent with multifocal infection. No new airspace opacity. Electronically Signed   By: Eddie Candle  M.D.   On: 08/27/2020 11:04   DG Chest Port 1 View  Result Date: 08/25/2020 CLINICAL DATA:  Shortness of breath.  COVID-19 positive. EXAM: PORTABLE CHEST 1 VIEW COMPARISON:  August 23, 2020. FINDINGS: Stable cardiomediastinal silhouette. No pneumothorax is noted. Stable bilateral lung opacities are noted concerning for multifocal pneumonia. Bony thorax is unremarkable. IMPRESSION: Stable bilateral lung opacities are noted concerning for multifocal pneumonia. Electronically Signed   By: Marijo Conception M.D.   On:  08/25/2020 09:36   DG Chest Port 1 View  Result Date: 08/23/2020 CLINICAL DATA:  Shortness of breath EXAM: PORTABLE CHEST 1 VIEW COMPARISON:  None. FINDINGS: There is ill-defined airspace opacity in portions of each mid and lower lung region. Heart size and pulmonary vascularity are normal. No adenopathy. There is degenerative change in the left shoulder. IMPRESSION: Ill-defined airspace opacity in the mid and lower lung regions, most indicative of multifocal pneumonia. Question atypical organism pneumonia given this appearance. Check of COVID-19 status in this regard advised. Heart size within normal limits.  No adenopathy appreciable. Electronically Signed   By: Lowella Grip III M.D.   On: 08/23/2020 11:33   DG Abd Portable 1V  Result Date: 09/04/2020 CLINICAL DATA:  NG tube placement EXAM: PORTABLE ABDOMEN - 1 VIEW COMPARISON:  03/03/2020 FINDINGS: NG tube has been advanced slightly with the tip in the proximal stomach. The side port is in the distal esophagus just above the GE junction. Bilateral lower lobe airspace opacities. IMPRESSION: NG tube tip in the proximal stomach with the side port in the distal esophagus. Electronically Signed   By: Rolm Baptise M.D.   On: 09/04/2020 02:08   DG Abd Portable 1V  Result Date: 09/03/2020 CLINICAL DATA:  Check gastric catheter placement EXAM: PORTABLE ABDOMEN - 1 VIEW COMPARISON:  None. FINDINGS: Gastric catheter is noted in the distal esophagus just short of the gastroesophageal junction. This should be advanced several cm deeper into the stomach. If it has been advanced since the prior chest x-ray from 1242 hours there may be looping in the pharynx. Clinical correlation is recommended. Patchy airspace disease is again identified in both lungs. IMPRESSION: Gastric catheter stable in appearance. This should be advanced several cm as described. Electronically Signed   By: Inez Catalina M.D.   On: 09/03/2020 23:38   ECHOCARDIOGRAM COMPLETE  Result  Date: 09/01/2020    ECHOCARDIOGRAM REPORT   Patient Name:   DONALD JACQUE Date of Exam: 09/01/2020 Medical Rec #:  [id number]    Height:       68.0 in Accession #:    [id number]   Weight:       219.8 lb Date of Birth:  1951-09-15    BSA:          2.127 m Patient Age:    66 years     BP:           103/57 mmHg Patient Gender: F            HR:           77 bpm. Exam Location:  Inpatient Procedure: 2D Echo Indications:    atrial fibrillation 427.31  History:        Patient has no prior history of Echocardiogram examinations.                 Risk Factors:Hypertension, Diabetes and Dyslipidemia. Covid +.  Sonographer:    Jannett Celestine RDCS (AE) Referring Phys: [id number] AMY N [facility]  Sonographer Comments: Technically  difficult study due to poor echo windows. Restricted mobility. IMPRESSIONS  1. Left ventricular ejection fraction, by estimation, is 70 to 75%. The left ventricle has hyperdynamic function. Left ventricular endocardial border not optimally defined to evaluate regional wall motion, but appears grossly normal. There is mild left ventricular hypertrophy.  2. RV is not well visualized due to image quality. Right ventricular systolic function is mildly reduced. The right ventricular size is mildly enlarged. TR signal inadequate for assessing RVSP.  3. The mitral valve is normal in structure. Trivial mitral valve regurgitation. No evidence of mitral stenosis.  4. The aortic valve is tricuspid. Aortic valve regurgitation is not visualized. No aortic stenosis is present.  5. The inferior vena cava is normal in size with greater than 50% respiratory variability, suggesting right atrial pressure of 3 mmHg. FINDINGS  Left Ventricle: Midcavitary gradient 17.5 mmHg, Vmax 2 m/s. Left ventricular ejection fraction, by estimation, is 70 to 75%. The left ventricle has hyperdynamic function. Left ventricular endocardial border not optimally defined to evaluate regional wall motion. The left ventricular internal cavity size was normal  in size. There is mild left ventricular hypertrophy. Left ventricular diastolic parameters are indeterminate. Right Ventricle: RV is not well visualized. The right ventricular size is mildly enlarged. No increase in right ventricular wall thickness. Right ventricular systolic function is mildly reduced. Tricuspid regurgitation signal is inadequate for assessing PA pressure. Left Atrium: Left atrial size was normal in size. Right Atrium: Right atrial size was normal in size. Pericardium: There is no evidence of pericardial effusion. Mitral Valve: The mitral valve is normal in structure. Trivial mitral valve regurgitation. No evidence of mitral valve stenosis. Tricuspid Valve: The tricuspid valve is normal in structure. Tricuspid valve regurgitation is not demonstrated. No evidence of tricuspid stenosis. Aortic Valve: The aortic valve is tricuspid. Aortic valve regurgitation is not visualized. No aortic stenosis is present. Pulmonic Valve: The pulmonic valve was grossly normal. Pulmonic valve regurgitation is trivial. No evidence of pulmonic stenosis. Aorta: The aortic root is normal in size and structure. Venous: The inferior vena cava is normal in size with greater than 50% respiratory variability, suggesting right atrial pressure of 3 mmHg. IAS/Shunts: No atrial level shunt detected by color flow Doppler.  LEFT VENTRICLE PLAX 2D LVIDd:         3.45 cm LVIDs:         2.30 cm LV PW:         1.20 cm LV IVS:        1.15 cm LVOT diam:     2.20 cm LV SV:         70 LV SV Index:   33 LVOT Area:     3.80 cm  LEFT ATRIUM           Index LA diam:      3.20 cm 1.50 cm/m LA Vol (A2C): 32.9 ml 15.46 ml/m  AORTIC VALVE LVOT Vmax:   101.00 cm/s LVOT Vmean:  85.000 cm/s LVOT VTI:    0.185 m  AORTA Ao Root diam: 2.90 cm MITRAL VALVE MV Area (PHT): 2.95 cm    SHUNTS MV Decel Time: 257 msec    Systemic VTI:  0.18 m MV E velocity: 78.00 cm/s  Systemic Diam: 2.20 cm Cherlynn Kaiser MD Electronically signed by Cherlynn Kaiser MD  Signature Date/Time: 09/01/2020/11:02:05 AM    Final    VAS [place] LOWER EXTREMITY VENOUS (DVT)  Result Date: 08/24/2020  Lower Venous DVTStudy Indications: Elevated d-dimer, covid.  Comparison  Study: No prior studies. Performing Technologist: Darlin Coco  Examination Guidelines: A complete evaluation includes B-mode imaging, spectral Doppler, color Doppler, and power Doppler as needed of all accessible portions of each vessel. Bilateral testing is considered an integral part of a complete examination. Limited examinations for reoccurring indications may be performed as noted. The reflux portion of the exam is performed with the patient in reverse Trendelenburg.  +---------+---------------+---------+-----------+----------+--------------+ RIGHT    CompressibilityPhasicitySpontaneityPropertiesThrombus Aging +---------+---------------+---------+-----------+----------+--------------+ CFV      Full           Yes      Yes                                 +---------+---------------+---------+-----------+----------+--------------+ SFJ      Full                                                        +---------+---------------+---------+-----------+----------+--------------+ FV Prox  Full                                                        +---------+---------------+---------+-----------+----------+--------------+ FV Mid   Full                                                        +---------+---------------+---------+-----------+----------+--------------+ FV DistalFull                                                        +---------+---------------+---------+-----------+----------+--------------+ PFV      Full                                                        +---------+---------------+---------+-----------+----------+--------------+ POP      Full           Yes      Yes                                  +---------+---------------+---------+-----------+----------+--------------+ PTV      Full                                                        +---------+---------------+---------+-----------+----------+--------------+ PERO     Full                                                        +---------+---------------+---------+-----------+----------+--------------+   +---------+---------------+---------+-----------+----------+--------------+  LEFT     CompressibilityPhasicitySpontaneityPropertiesThrombus Aging +---------+---------------+---------+-----------+----------+--------------+ CFV      Full           Yes      Yes                                 +---------+---------------+---------+-----------+----------+--------------+ SFJ      Full                                                        +---------+---------------+---------+-----------+----------+--------------+ FV Prox  Full                                                        +---------+---------------+---------+-----------+----------+--------------+ FV Mid   Full                                                        +---------+---------------+---------+-----------+----------+--------------+ FV DistalFull                                                        +---------+---------------+---------+-----------+----------+--------------+ PFV      Full                                                        +---------+---------------+---------+-----------+----------+--------------+ POP      Full           Yes      Yes                                 +---------+---------------+---------+-----------+----------+--------------+ PTV      Full                                                        +---------+---------------+---------+-----------+----------+--------------+ PERO     Full                                                         +---------+---------------+---------+-----------+----------+--------------+     Summary: RIGHT: - There is no evidence of deep vein thrombosis in the lower extremity.  - No cystic structure found in the popliteal fossa.  LEFT: - There is no evidence of deep vein thrombosis in the lower extremity.  - No  cystic structure found in the popliteal fossa.  *See table(s) above for measurements and observations. Electronically signed by Deitra Mayo MD on 08/24/2020 at 5:36:23 PM.    Final    [place] EKG SITE RITE  Result Date: 09/03/2020 If Site Rite image not attached, placement could not be confirmed due to current cardiac rhythm.  [place] EKG SITE RITE  Result Date: 09/03/2020 If Site Rite image not attached, placement could not be confirmed due to current cardiac rhythm.

## 2020-09-05 ENCOUNTER — Inpatient Hospital Stay (HOSPITAL_COMMUNITY): Payer: Medicare Other

## 2020-09-05 DIAGNOSIS — I4891 Unspecified atrial fibrillation: Secondary | ICD-10-CM | POA: Diagnosis not present

## 2020-09-05 LAB — COMPREHENSIVE METABOLIC PANEL
ALT: 22 U/L (ref 0–44)
AST: 35 U/L (ref 15–41)
Albumin: 1.8 g/dL — ABNORMAL LOW (ref 3.5–5.0)
Alkaline Phosphatase: 91 U/L (ref 38–126)
Anion gap: 7 (ref 5–15)
BUN: 37 mg/dL — ABNORMAL HIGH (ref 8–23)
CO2: 27 mmol/L (ref 22–32)
Calcium: 9.4 mg/dL (ref 8.9–10.3)
Chloride: 119 mmol/L — ABNORMAL HIGH (ref 98–111)
Creatinine, Ser: 1.02 mg/dL — ABNORMAL HIGH (ref 0.44–1.00)
GFR, Estimated: 60 mL/min — ABNORMAL LOW (ref >60)
Glucose, Bld: 261 mg/dL — ABNORMAL HIGH (ref 70–99)
Potassium: 4 mmol/L (ref 3.5–5.1)
Sodium: 153 mmol/L — ABNORMAL HIGH (ref 135–145)
Total Bilirubin: 0.4 mg/dL (ref 0.3–1.2)
Total Protein: 5 g/dL — ABNORMAL LOW (ref 6.5–8.1)

## 2020-09-05 LAB — GLUCOSE, CAPILLARY
Glucose-Capillary: 217 mg/dL — ABNORMAL HIGH (ref 70–99)
Glucose-Capillary: 215 mg/dL — ABNORMAL HIGH (ref 70–99)
Glucose-Capillary: 306 mg/dL — ABNORMAL HIGH (ref 70–99)
Glucose-Capillary: 335 mg/dL — ABNORMAL HIGH (ref 70–99)
Glucose-Capillary: 278 mg/dL — ABNORMAL HIGH (ref 70–99)

## 2020-09-05 LAB — CBC
HCT: 33.2 % — ABNORMAL LOW (ref 36.0–46.0)
Hemoglobin: 10.1 g/dL — ABNORMAL LOW (ref 12.0–15.0)
MCH: 23.1 pg — ABNORMAL LOW (ref 26.0–34.0)
MCHC: 30.4 g/dL (ref 30.0–36.0)
MCV: 75.8 fL — ABNORMAL LOW (ref 80.0–100.0)
Platelets: 280 10*3/uL (ref 150–400)
RBC: 4.38 MIL/uL (ref 3.87–5.11)
RDW: 15.9 % — ABNORMAL HIGH (ref 11.5–15.5)
WBC: 9.7 10*3/uL (ref 4.0–10.5)
nRBC: 0.4 % — ABNORMAL HIGH (ref 0.0–0.2)

## 2020-09-05 LAB — MAGNESIUM: Magnesium: 1.8 mg/dL (ref 1.7–2.4)

## 2020-09-05 LAB — PROCALCITONIN: Procalcitonin: 1.19 ng/mL

## 2020-09-05 LAB — D-DIMER, QUANTITATIVE: D-Dimer, Quant: 1.89 ug/mL-FEU — ABNORMAL HIGH (ref 0.00–0.50)

## 2020-09-05 LAB — C-REACTIVE PROTEIN: CRP: 2.5 mg/dL — ABNORMAL HIGH (ref <1.0)

## 2020-09-05 LAB — PHOSPHORUS: Phosphorus: 2 mg/dL — ABNORMAL LOW (ref 2.5–4.6)

## 2020-09-05 MED ORDER — DEXTROSE 5 % IV SOLN: INTRAVENOUS | Status: DC

## 2020-09-05 MED ORDER — METOPROLOL TARTRATE 5 MG/5ML IV SOLN
5.0000 mg | Freq: Three times a day (TID) | INTRAVENOUS | Status: DC | PRN
  Administered 2020-09-05 – 2020-09-06 (×3): 5 mg via INTRAVENOUS
  Filled 2020-09-05 (×3): qty 5

## 2020-09-05 MED ORDER — MAGNESIUM SULFATE IN D5W 1-5 GM/100ML-% IV SOLN
1.0000 g | Freq: Once | INTRAVENOUS | Status: AC
  Administered 2020-09-05: 1 g via INTRAVENOUS
  Filled 2020-09-05: qty 100

## 2020-09-05 MED ORDER — POTASSIUM PHOSPHATES 15 MMOLE/5ML IV SOLN
15.0000 mmol | Freq: Once | INTRAVENOUS | Status: AC
  Administered 2020-09-05: 15 mmol via INTRAVENOUS
  Filled 2020-09-05: qty 5

## 2020-09-05 MED ORDER — TRAVASOL 10 % IV SOLN
INTRAVENOUS | Status: AC
  Filled 2020-09-05: qty 547.2

## 2020-09-05 MED ORDER — METHYLPREDNISOLONE SODIUM SUCC 40 MG IJ SOLR
10.0000 mg | Freq: Every day | INTRAMUSCULAR | Status: AC
  Administered 2020-09-05: 10 mg via INTRAVENOUS
  Filled 2020-09-05: qty 1

## 2020-09-05 MED ORDER — INSULIN DETEMIR 100 UNIT/ML ~~LOC~~ SOLN
20.0000 [IU] | Freq: Two times a day (BID) | SUBCUTANEOUS | Status: DC
  Administered 2020-09-05 – 2020-09-07 (×5): 20 [IU] via SUBCUTANEOUS
  Filled 2020-09-05 (×6): qty 0.2

## 2020-09-05 MED ORDER — INSULIN ASPART 100 UNIT/ML ~~LOC~~ SOLN
0.0000 [IU] | SUBCUTANEOUS | Status: DC
  Administered 2020-09-05: 15 [IU] via SUBCUTANEOUS
  Administered 2020-09-05: 11 [IU] via SUBCUTANEOUS
  Administered 2020-09-05: 7 [IU] via SUBCUTANEOUS
  Administered 2020-09-05: 15 [IU] via SUBCUTANEOUS
  Administered 2020-09-06: 4 [IU] via SUBCUTANEOUS
  Administered 2020-09-06: 7 [IU] via SUBCUTANEOUS
  Administered 2020-09-06: 12 [IU] via SUBCUTANEOUS
  Administered 2020-09-06 (×2): 11 [IU] via SUBCUTANEOUS
  Administered 2020-09-07: 15 [IU] via SUBCUTANEOUS
  Administered 2020-09-07: 7 [IU] via SUBCUTANEOUS
  Administered 2020-09-07: 5 [IU] via SUBCUTANEOUS
  Administered 2020-09-07 (×2): 15 [IU] via SUBCUTANEOUS
  Administered 2020-09-07: 7 [IU] via SUBCUTANEOUS
  Administered 2020-09-08: 4 [IU] via SUBCUTANEOUS
  Administered 2020-09-08: 15 [IU] via SUBCUTANEOUS
  Administered 2020-09-08: 11 [IU] via SUBCUTANEOUS
  Administered 2020-09-08 (×2): 3 [IU] via SUBCUTANEOUS

## 2020-09-05 MED ORDER — DIGOXIN 0.25 MG/ML IJ SOLN
0.2500 mg | Freq: Four times a day (QID) | INTRAMUSCULAR | Status: AC
  Administered 2020-09-05 (×2): 0.25 mg via INTRAVENOUS
  Filled 2020-09-05 (×2): qty 2

## 2020-09-05 NOTE — Progress Notes (Signed)
Received call from telemetry that pt's heart rate got as high as the 190's. Scheduled Digoxin administered. Currently 120-130;s after administration. Will continue to monitor.

## 2020-09-05 NOTE — Progress Notes (Signed)
   09/05/20 1218  Assess: MEWS Score  Temp 98.4 F (36.9 C)  BP (!) 118/93  Pulse Rate 100  ECG Heart Rate (!) 121  Resp (!) 24  SpO2 94 %  O2 Device Room Air  Patient Activity (if Appropriate) In chair  Assess: MEWS Score  MEWS Temp 0  MEWS Systolic 0  MEWS Pulse 2  MEWS RR 1  MEWS LOC 0  MEWS Score 3  MEWS Score Color Yellow  Assess: if the MEWS score is Yellow or Red  Were vital signs taken at a resting state? Yes  Focused Assessment No change from prior assessment  Early Detection of Sepsis Score *See Row Information* Low  MEWS guidelines implemented *See Row Information* Yes  Treat  MEWS Interventions Escalated (See documentation below)  Pain Scale 0-10  Pain Score Asleep  Take Vital Signs  Increase Vital Sign Frequency  Yellow: Q 2hr X 2 then Q 4hr X 2, if remains yellow, continue Q 4hrs  Escalate  MEWS: Escalate Yellow: discuss with charge nurse/RN and consider discussing with provider and RRT  Notify: Charge Nurse/RN  Name of Charge Nurse/RN Notified Primitivo Gauze RN  Date Charge Nurse/RN Notified 09/05/20  Time Charge Nurse/RN Notified 1230  Notify: Provider  Provider Name/Title Dr. Candiss Norse  Date Provider Notified 09/05/20  Time Provider Notified 0109  Notification Type Page  Notification Reason Other (Comment) (pt in yellow MEWS)  Response See new orders  Date of Provider Response 09/05/20  Time of Provider Response [id number]

## 2020-09-05 NOTE — Progress Notes (Signed)
PHARMACY - TOTAL PARENTERAL NUTRITION CONSULT NOTE   Indication: Not tolerating enteral feeds  Patient Measurements: Height: 5\' 8"  (172.7 cm) Weight: 99.7 kg (219 lb 12.8 oz) IBW/kg (Calculated) : 63.9 TPN AdjBW (KG): 72.9 Body mass index is 33.42 kg/m.  Assessment: Candace [name] is a 69yo female PMH HTN, HLD, DM currently admitted to [facility] with covid-19. She was admitted 10/25 through 10/31 with covid-19, discharged home on supplemental oxygen and on eliquis due to elevated d-dimer. She returned to the ED yesterday when her home health nurse was concerned about elevated heart rate. She was found to be in Afib RVR and is now on cardizem drip with HR 90's. Late last night she developed abdominal pain that progressively got worse today. CT scan was obtained and shows bowel perforation, pneumoperitoneum and intermediate density free fluid in the abdomen. Of note, patient has a prior h/o partial colectomy/colostomy for colon cancer, completion colectomy and ileostomy. S/pexploratory laparotomy, adhesiolysis, modified graham patch repair of perforated duodenal ulcer11/3.  Pharmacy consulted to start TPN due to baseline malnutrition and likely prolonged inability to tolerate PO feeds.   Glucose / Insulin: Currently on moderate SSI - 50 units/24h. CBGs 220-390 and will limit TPN rate progression until better controlled. Worsened by steroids - reduced 11/5, 11/6, and planning last dose of 10 mg x 1 on 11/7. Discussed with MD - will increase levemir and increase SSI to resistant. MD also did not want to attempt to advance TPN with new bag on 11/7 - to continue current rate for an additional 24h.  Electrolytes: Na 153, K 4, Cl elevated at 119, HCO3 27 - attempted 1/2 NS on 11/6 for 24h with rise in Na, MD ordered D5W for 24h today to help with hypernatremia. Noted Na is removed from TPN bag Renal: Scr down 1.02 << 1.34, BUN 40 LFTs / TGs: WNL Prealbumin / albumin: 15.8 (11/6), alb 1.8 Intake / Output; MIVF:  UOP 0.8 ml/kg/hr; drain 30 mls, stool 250 mls,  GI Imaging: None since TPN start Surgeries / Procedures:  11/3 exploratory laparotomy, adhesiolysis, modified graham patch repair of perforated duodenal ulcer  Central access: Triple lumen PICC placed 11/5 TPN start date: 11/5  Nutritional Goals (per RD on 11/5): kCal: 2200-2450, Protein: 115-130gms , Fluid >/=2L/day Goal TPN rate is 90 mL/hr (provides 123 g of protein and 2293 kcals per day)  Current Nutrition:  TPN at 40 cc/hr will provide 1020 kcal and 54g protein D5W @ 75 cc/hr will provide 306 kcal of CHO  Plan:  Continue TPN at 40 mL/hr for now (rate limited for now until CBGs can be better controlled). TPN will provide 1020 kcal and 54g protein meeting ~46% of estimated needs.  Electrolytes in TPN: 0 mEq/L of Na, 50 mEq/L of K, 83mEq/L of Ca, incr to 7 mEq/L of Mg, incr to 15 mmol/L of Phos, max acetate Increase levemir to 20 units bid bid - discussed with MD, will attempt to manage outside of TPN bag while steroids are being weaned   Add standard MVI and trace elements to TPN  Increase to resistant SSI q4hr and adjust as needed  D5W at 75 cc/hr per MD for 24h - switched to 1/2 NS yesterday but still with rising Na - so to keep D5W today, noted will contribute to elevated CBGs KPhos 15 mmol x 1 today (will also provide 22 mEq KCl) Mg 1g IV x 1 today Monitor TPN labs on Mon/Thurs  Thank you for allowing pharmacy to be a  part of this patient's care.  Alycia Rossetti, PharmD, BCPS Clinical Pharmacist Clinical phone for 09/05/2020: [phone] 09/05/2020 7:31 AM   **Pharmacist phone directory can now be found on [email or web address] ([email or web address]).  Listed under [place].

## 2020-09-05 NOTE — Progress Notes (Addendum)
PROGRESS NOTE                                                                                                                                                                                                             Patient Demographics:    Candace [name], is a 69 y.o. female, DOB - 1951-07-10, YKZ:[id number]  Outpatient Primary MD for the patient is [name], [name], MD   Admit date - 08/31/2020   LOS - 4  CC - fast Heart rate     Brief Narrative: Patient is a 69 y.o. female was recently hospitalized for COVID-19 pneumonia-and discharged on 2-3 L of home O2-presents with worsening shortness of breath-found to have A. fib with RVR, ?? worsening hypoxia requiring up to 6 L of oxygen ( down to 2 - 3 lits within 12 hrs) and subsequently admitted to the hospitalist service.  Post admission-patient developed severe upper abdominal pain-subsequent further imaging studies revealed perforated viscus-she underwent a laparotomy on 11/3 which showed a perforated duodenal ulcer.  See below for further details.  COVID-19 vaccinated status: Unvaccinated  Significant Events: 10/25-10/31>> hospitalization for COVID-19 pneumonia-discharged on 2-3 L of oxygen. 11/2>> Admit to [facility] for worsening hypoxemia requiring 6 L of oxygen, A. fib with RVR 11/3>> developed abdominal pain-CT abdomen positive for perforated viscus-underwent emergent laparotomy  Significant studies: 10/26>> bilateral lower extremity Doppler: No DVT. 10/27>> renal ultrasound: Cystic changes at the renal hila likely parapelvic cysts.  Hydronephrosis felt to be less likely. 11/2>>Chest x-ray: Persistent bilateral pulmonary infiltrates consistent with multifocal pneumonia 11/2>> CTA chest: No PE, areas of consolidation and groundglass density bilaterally. 11/3>> Echo: EF 70-75% 11/3>> CT abdomen/pelvis: Pneumoperitoneum/intermediate density free fluid in the abdomen-positive for  bowel perforation.  COVID-19 medications: Steroids: Resume 11/2>> Remdesivir: 10/25>> 10/29  Antibiotics: Rocephin: 11/2 x1 Zithromax: 11/12>> 11/3 Zosyn: 11/3>>  Microbiology data: 10/25 >>blood culture: No growth  Procedures: 11/12>> ex lap-modified Phillip Heal patch repair of perforated duodenal ulcer.  Consults: None  DVT prophylaxis: Place TED hose Start: 08/31/20 1711 Place TED hose Start: 08/31/20 1507 Started on therapeutic Lovenox on 11/4    Subjective:   Patient in bed, appears comfortable, denies any headache, no fever, no chest pain or pressure, no shortness of breath , no abdominal pain. No focal weakness.    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  Doubt bacterial pneumonia-procalcitonin levels are normal-she is on Zosyn to cover her intra-abdominal issues.  Continue Solu-Medrol-but taper down to 40 mg daily-CRP is elevated-but this is likely more from surgical issues rather than worsening of her COVID-19 inflammation.  She comes off of quarantine on 09/09/2020.  O2 requirements:  SpO2: 94 % O2 Flow Rate (L/min): 3 L/min   Prone/Incentive Spirometry: encouraged incentive spirometry use 3-4/hour.   Recent Labs  Lab 08/31/20 1315 08/31/20 1315 08/31/20 1922 09/01/20 0043 09/01/20 0718 09/01/20 1643 09/01/20 1817 09/02/20 0313 09/03/20 0054 09/04/20 0514 09/05/20 0355  WBC 14.5*   < >  --  11.6*  --   --   --  20.6* 18.4* 12.4* 9.7  HGB 13.4   < >  --  12.1  --    < > 10.5* 9.5* 8.9* 10.1* 10.1*  HCT 42.4   < >  --  39.5  --    < > 31.0* 30.8* 28.7* 32.7* 33.2*  PLT 365   < >  --  324  --   --   --  280 255 291 280  CRP  --   --  9.1*  --   --   --   --  12.4* 11.7* 6.2* 2.5*  DDIMER  --   --   --   --  2.53*  --   --  2.96* 7.49* 3.64* 1.89*  PROCALCITON  --   --   --   --  <0.10  --   --   --   --   --  1.19  AST 29  --   --   --   --   --   --  17 17  20  35  ALT 21  --   --   --   --   --   --  14 13 20 22   ALKPHOS 57  --   --   --   --   --   --  40 46 65 91  BILITOT 1.2  --   --   --   --   --   --  0.6 0.7 0.5 0.4  ALBUMIN 2.5*  --   --   --   --   --   --  1.9* 1.8* 1.9* 1.8*   < > = values in this interval not displayed.    Perforated duodenal ulcer s/p exploratory laparotomy on 11/3: Remains n.p.o.- NG tube/JP drain in place-General surgery following and directing care.  Remains on PPI twice daily.  General surgery planning on starting TNA.  History of colon cancer-s/p Ileostomy since 2012   Elevated D-dimer: Secondary surgical issues rather than COVID-19-irrespective is on therapeutic Lovenox.  Paroxysmal atrial fibrillation with RVR: Remains in atrial fibrillation-rate controlled with Cardizem-oral metoprolol/Eliquis on hold as patient n.p.o.  After discussion with general surgery-have started SQ Lovenox.  CHA2DS2-VASc score of at least 3, echo with preserved EF.  Once stable for oral intake-we will attempt to slowly put her back on oral rate control agents.  AKI on CKD stage IIIb: AKI hemodynamically mediated-improved after hydration and close to baseline.  Anemia: Likely secondary to acute/critical illness-follow.  Hypernatremia.  D5W on 09/05/20.    HLD: Hold statin as patient n.p.o.  DM-2 (A1c 7.0 on 10/26) with uncontrolled hyperglycemia due to steroids: TNA adjusted, added Levemir, continue sliding scale and monitor.  Steroids being tapered.  Recent Labs    09/04/20 2001 09/05/20 0448 09/05/20 0733  GLUCAP 281* 217* 215*     Obesity: Estimated body mass index is 33.42 kg/m as calculated from the following:   Height as of this encounter: 5\' 8"  (1.727 m).   Weight as of this encounter: 99.7 kg.    GI prophylaxis: PPI  Condition - Stable  Family Communication  :  Daughter [name] [phone]) updated over the phone 09/05/20  Code Status :  Full Code Diet :  Diet Order            Diet NPO time  specified  Diet effective now                  Disposition Plan  :   The patient will require care spanning > 2 midnights and should be moved to inpatient because: Inpatient level of care appropriate due to severity of illness  Dispo: The patient is from: Home              Anticipated d/c is to: Home              Anticipated d/c date is: > 3 days              Patient currently is not medically stable to d/c.    Barriers to discharge: Hypoxia requiring O2 supplementation-perforated duodenal ulcer-s/p exploratory laparotomy on 11/3-remains n.p.o. with NG tube/JP drains in place.  Antimicorbials  :    Anti-infectives (From admission, onward)   Start     Dose/Rate Route Frequency Ordered Stop   09/02/20 1600  cefTRIAXone (ROCEPHIN) 1 g in sodium chloride 0.9 % 100 mL IVPB  Status:  Discontinued        1 g 200 mL/hr over 30 Minutes Intravenous Every 24 hours 09/01/20 1811 09/02/20 0838   09/01/20 1800  fluconazole (DIFLUCAN) IVPB 400 mg        400 mg 50 mL/hr over 240 Minutes Intravenous  Once 09/01/20 1749 09/02/20 0603   09/01/20 1530  piperacillin-tazobactam (ZOSYN) IVPB 3.375 g        3.375 g 12.5 mL/hr over 240 Minutes Intravenous Every 8 hours 09/01/20 1514 09/05/20 2359   09/01/20 1000  levofloxacin (LEVAQUIN) tablet 250 mg  Status:  Discontinued        250 mg Oral Daily 08/31/20 1508 08/31/20 1735   08/31/20 1730  cefTRIAXone (ROCEPHIN) 1 g in sodium chloride 0.9 % 100 mL IVPB  Status:  Discontinued        1 g 200 mL/hr over 30 Minutes Intravenous Every 24 hours 08/31/20 1726 09/01/20 1513   08/31/20 1730  azithromycin (ZITHROMAX) 500 mg in sodium chloride 0.9 % 250 mL IVPB  Status:  Discontinued        500 mg 250 mL/hr over 60 Minutes Intravenous Every 24 hours 08/31/20 1726 09/02/20 0838      Inpatient Medications  Scheduled Meds: . Chlorhexidine Gluconate Cloth  6 each Topical Daily  . enoxaparin (LOVENOX) injection  100 mg Subcutaneous Q12H  . insulin aspart   0-20 Units Subcutaneous Q4H  . insulin detemir  20 Units Subcutaneous BID  . pantoprazole (PROTONIX) IV  40 mg Intravenous Q12H   Continuous Infusions: . dextrose 75 mL/hr at 09/05/20 0944  . diltiazem (CARDIZEM) infusion 15 mg/hr (09/05/20 0959)  . piperacillin-tazobactam (ZOSYN)  IV 3.375 g (09/05/20 0457)  . potassium PHOSPHATE IVPB (in mmol)    . TPN ADULT (ION) 40 mL/hr  at 09/04/20 1708   PRN Meds:.albuterol, metoprolol tartrate, morphine injection, phenol, sodium chloride, sodium chloride flush   Time Spent in minutes  25    See all Orders from today for further details   Lala Lund M.D on 09/05/2020 at 11:01 AM  To page go to [email or web address] - use universal password  [facility] -  Office  [phone]    Objective:   Vitals:   09/04/20 2002 09/05/20 0016 09/05/20 0400 09/05/20 0734  BP: 126/65 (!) 122/59 114/76 120/70  Pulse:  98 93 99  Resp:  (!) 21 20 20   Temp: 98.3 F (36.8 C) 97.9 F (36.6 C) 98 F (36.7 C) 98.1 F (36.7 C)  TempSrc: Axillary Axillary Axillary Oral  SpO2:  94% 93% 94%  Weight:      Height:        Wt Readings from Last 3 Encounters:  08/31/20 99.7 kg  08/23/20 99.8 kg     Intake/Output Summary (Last 24 hours) at 09/05/2020 1101 Last data filed at 09/05/2020 0730 Gross per 24 hour  Intake --  Output 3570 ml  Net -3570 ml     Physical Exam  Awake Alert, No new F.N deficits, NG tube in place, right arm PICC line Hardy.AT,PERRAL Supple Neck,No JVD, No cervical lymphadenopathy appriciated.  Symmetrical Chest wall movement, Good air movement bilaterally, CTAB RRR,No Gallops, Rubs or new Murmurs, No Parasternal Heave +ve B.Sounds, Abd Soft, ostomy bag in place, abdominal incision site stable with drain in place, No Cyanosis, Clubbing or edema, No new Rash or bruise     Data Review:    CBC Recent Labs  Lab 08/31/20 1315 08/31/20 1315 09/01/20 0043 09/01/20 1643 09/01/20 1817 09/02/20 0313 09/03/20 0054  09/04/20 0514 09/05/20 0355  WBC 14.5*   < > 11.6*  --   --  20.6* 18.4* 12.4* 9.7  HGB 13.4   < > 12.1   < > 10.5* 9.5* 8.9* 10.1* 10.1*  HCT 42.4   < > 39.5   < > 31.0* 30.8* 28.7* 32.7* 33.2*  PLT 365   < > 324  --   --  280 255 291 280  MCV 73.2*   < > 76.1*  --   --  74.0* 74.5* 75.2* 75.8*  MCH 23.1*   < > 23.3*  --   --  22.8* 23.1* 23.2* 23.1*  MCHC 31.6   < > 30.6  --   --  30.8 31.0 30.9 30.4  RDW 15.1   < > 15.3  --   --  15.3 15.5 15.7* 15.9*  LYMPHSABS 0.4*  --   --   --   --   --   --  0.2*  --   MONOABS 1.2*  --   --   --   --   --   --  0.4  --   EOSABS 0.0  --   --   --   --   --   --  0.0  --   BASOSABS 0.0  --   --   --   --   --   --  0.0  --    < > = values in this interval not displayed.    Chemistries  Recent Labs  Lab 08/31/20 1315 08/31/20 1315 09/01/20 0043 09/01/20 1643 09/01/20 1817 09/02/20 0313 09/03/20 0054 09/04/20 0514 09/05/20 0355  NA 136   < > 142   < > 143 144 147* 150* 153*  K 4.9   < >  5.1   < > 3.7 4.2 4.1 3.9 4.0  CL 102   < > 110  --   --  114* 116* 117* 119*  CO2 19*   < > 19*  --   --  18* 22 26 27   GLUCOSE 316*   < > 312*  --   --  242* 170* 402* 261*  BUN 60*   < > 45*  --   --  42* 43* 40* 37*  CREATININE 1.69*   < > 1.51*  --   --  1.53* 1.51* 1.35* 1.02*  CALCIUM 10.1   < > 9.4  --   --  8.8* 9.5 9.7 9.4  MG 1.8  --   --   --   --   --   --  1.9 1.8  AST 29  --   --   --   --  17 17 20  35  ALT 21  --   --   --   --  14 13 20 22   ALKPHOS 57  --   --   --   --  40 46 65 91  BILITOT 1.2  --   --   --   --  0.6 0.7 0.5 0.4   < > = values in this interval not displayed.   ------------------------------------------------------------------------------------------------------------------ Recent Labs    09/04/20 0514  TRIG 164*    Lab Results  Component Value Date   HGBA1C 7.0 (H) 08/24/2020   ------------------------------------------------------------------------------------------------------------------ No results for  input(s): TSH, T4TOTAL, T3FREE, THYROIDAB in the last 72 hours.  Invalid input(s): FREET3 ------------------------------------------------------------------------------------------------------------------ No results for input(s): VITAMINB12, FOLATE, FERRITIN, TIBC, IRON, RETICCTPCT in the last 72 hours.  Coagulation profile No results for input(s): INR, PROTIME in the last 168 hours.  Recent Labs    09/04/20 0514 09/05/20 0355  DDIMER 3.64* 1.89*    Cardiac Enzymes No results for input(s): CKMB, TROPONINI, MYOGLOBIN in the last 168 hours.  Invalid input(s): CK ------------------------------------------------------------------------------------------------------------------    Component Value Date/Time   BNP 51.0 08/29/2020 0323    Micro Results No results found for this or any previous visit (from the past 240 hour(s)).  Radiology Reports CT ABDOMEN PELVIS WO CONTRAST  Addendum Date: 09/01/2020   ADDENDUM REPORT: 09/01/2020 14:53 ADDENDUM: Critical Value/emergent results were called by telephone at the time of interpretation on 09/01/2020 at 1447 hours to Dr. Oren Binet , who verbally acknowledged these results. Electronically Signed   By: Genevie Ann M.D.   On: 09/01/2020 14:53   Result Date: 09/01/2020 CLINICAL DATA:  69 year old female with abdominal pain. COVID-19. History of colon cancer status post resection. EXAM: CT ABDOMEN AND PELVIS WITHOUT CONTRAST TECHNIQUE: Multidetector CT imaging of the abdomen and pelvis was performed following the standard protocol without IV contrast. COMPARISON:  CTA chest yesterday.  CT Abdomen and Pelvis 06/07/2014. FINDINGS: Lower chest: Extensive lower lung pneumonia. No pericardial or pleural effusion. Hepatobiliary: Pneumoperitoneum and intermediate density free fluid adjacent to the liver. Vicarious contrast excretion to the gallbladder. Grossly normal liver parenchyma. Pancreas: Negative noncontrast pancreas. Spleen: Negative.  Adrenals/Urinary Tract: Stable low-density bilateral adrenal adenomas since 2015. Chronic renal parapelvic cysts redemonstrated. No definite hydronephrosis. No acute perinephric stranding. Both ureters are decompressed. Excreted IV contrast in the urinary bladder. Stomach/Bowel: Left lower quadrant ostomy seen in 2015 has been taken down, along with completion total colectomy since that time. 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 There is abnormal free air and intermediate density free fluid  in the upper abdomen. Free air continues in the ventral small bowel mesentery toward the pelvis. And free air tracks along the right ileostomy loop. Oral contrast was administered, and has not leaked from the stomach, duodenum, or proximal jejunum. No abnormally dilated loops. And the specific site of bowel perforation is unclear. There are several small bowel loops which might be adhered to the ventral abdominal wall abutting the greater curve of the stomach. Vascular/Lymphatic: Vascular patency is not evaluated in the absence of IV contrast. Mild Calcified aortic atherosclerosis. Normal caliber abdominal aorta. No lymphadenopathy. Reproductive: Retroverted uterus suspected, with surgically absent rectum. Other: Moderate pelvic free fluid with simple fluid density, similar to the 2015 CT. Distal colon appear surgically absent. Musculoskeletal: Advanced disc and endplate degeneration throughout the visible spine. No acute or suspicious osseous lesion identified. IMPRESSION: 1. Positive for bowel perforation: Pneumoperitoneum and intermediate density free fluid in the abdomen. Prior total colectomy. The specific site of perforation is unclear-oral contrast present to the proximal jejunum has not obviously leaked. Note that there may be small bowel loops adherent to the ventral abdominal wall along the greater curve of the stomach. 2. Extensive bilateral lower lung pneumonia. No pleural effusion. 3. Other abdominal and pelvic  viscera are stable since 2015, including bilateral adrenal adenomas. Chronic renal parapelvic cysts. 4. Aortic Atherosclerosis (ICD10-I70.0). Electronically Signed: By: Genevie Ann M.D. On: 09/01/2020 14:40   DG Abd 1 View  Result Date: 09/05/2020 CLINICAL DATA:  Nausea EXAM: ABDOMEN - 1 VIEW COMPARISON:  09/04/2020 abdominal radiograph FINDINGS: Enteric tube terminates in the body of the stomach. No dilated small bowel loops. Minimal colonic gas. No evidence of pneumatosis or pneumoperitoneum. Degenerative changes in the lumbar spine. No radiopaque nephrolithiasis. IMPRESSION: Enteric tube terminates in the body of the stomach. Nonobstructive bowel gas pattern. Electronically Signed   By: Ilona Sorrel M.D.   On: 09/05/2020 08:33   CT Angio Chest PE W and/or Wo Contrast  Result Date: 08/31/2020 CLINICAL DATA:  Hypoxia, positive COVID EXAM: CT ANGIOGRAPHY CHEST WITH CONTRAST TECHNIQUE: Multidetector CT imaging of the chest was performed using the standard protocol during bolus administration of intravenous contrast. Multiplanar CT image reconstructions and MIPs were obtained to evaluate the vascular anatomy. CONTRAST:  47mL OMNIPAQUE IOHEXOL 350 MG/ML SOLN COMPARISON:  None. FINDINGS: Cardiovascular: Satisfactory opacification of the pulmonary arteries to the segmental level. No evidence of pulmonary embolism. Normal heart size. No pericardial effusion. Mediastinum/Nodes: No enlarged lymph nodes. Lungs/Pleura: Bilateral areas of consolidation and ground-glass density with a lower lobe predominance. No pleural effusion. No pneumothorax. Upper Abdomen: Partially imaged left adrenal nodule was present on 2015 abdominal CT. Right adrenal nodule was also present in 2015. Musculoskeletal: Degenerative changes of the included spine. Review of the MIP images confirms the above findings. IMPRESSION: No evidence of acute pulmonary embolism. COVID-19 pneumonia. Electronically Signed   By: Macy Mis M.D.   On:  08/31/2020 16:19   US RENAL  Result Date: 08/25/2020 CLINICAL DATA:  Acute kidney injury. EXAM: RENAL / URINARY TRACT ULTRASOUND COMPLETE COMPARISON:  Abdominal CT 06/07/2014 FINDINGS: Right Kidney: Renal measurements: 10.7 x 5.2 x 5.0 cm = volume: 146 mL. Cystic changes at the renal hila likely parapelvic cysts as seen on prior CT. Normal renal parenchymal echogenicity. No shadowing calculus. No solid renal mass. Left Kidney: Renal measurements: 10.7 x 6.2 x 5.7 cm = volume: 197 mL. Cystic changes at the renal hila likely parapelvic cyst is seen on prior CT. Normal renal parenchymal echogenicity. No shadowing  calculus. No solid renal mass. Bladder: Appears normal for degree of bladder distention. Both ureteral jets are seen. Other: None. IMPRESSION: Cystic changes at the renal hila likely parapelvic cysts as seen on prior CT. Mild bilateral hydronephrosis is felt less likely as both ureteral jets are seen in the bladder. Electronically Signed   By: Keith Rake M.D.   On: 08/25/2020 01:55   DG Chest Port 1 View  Result Date: 09/05/2020 CLINICAL DATA:  Dyspnea, atrial fibrillation, COVID positive EXAM: PORTABLE CHEST 1 VIEW COMPARISON:  09/03/2020 chest radiograph. FINDINGS: Enteric tube enters stomach with the tip not seen on this image. Right PICC terminates over the lower third of the SVC. Stable cardiomediastinal silhouette with normal heart size. No pneumothorax. No pleural effusion. Extensive patchy opacities throughout the mid to lower lungs bilaterally, not substantially changed. IMPRESSION: Extensive patchy opacities throughout the mid to lower lungs bilaterally, not substantially changed, compatible with COVID-19 pneumonia. Electronically Signed   By: Ilona Sorrel M.D.   On: 09/05/2020 08:34   DG CHEST PORT 1 VIEW  Result Date: 09/03/2020 CLINICAL DATA:  PICC line placement.  COVID 19 positive. EXAM: PORTABLE CHEST 1 VIEW COMPARISON:  Chest x-ray dated August 31, 2020. FINDINGS: New right  upper extremity PICC line with the tip in the distal SVC. Enteric tube tip at the gastroesophageal junction. Stable cardiomediastinal silhouette. Bilateral mid and lower lung patchy opacities are not significantly changed. Postsurgical changes in the right upper lobe again noted. No pleural effusion or pneumothorax. No acute osseous abnormality. IMPRESSION: 1. New right upper extremity PICC line with tip in the distal SVC. 2. Enteric tube tip at the gastroesophageal junction. Recommend advancement. 3. Unchanged multifocal pneumonia. Electronically Signed   By: Titus Dubin M.D.   On: 09/03/2020 13:00   DG Chest Portable 1 View  Result Date: 08/31/2020 CLINICAL DATA:  Dyspnea at home health check up this morning, had COVID-19 diagnosis 10 days prior, recent discharge, new hypoxia EXAM: PORTABLE CHEST 1 VIEW COMPARISON:  Portable exam 1237 hours compared to 08/27/2020 FINDINGS: Normal heart size, mediastinal contours, and pulmonary vascularity. Atherosclerotic calcification aorta. Persistent infiltrates identified in the mid to lower lungs bilaterally consistent with multifocal pneumonia and COVID-19. Appearance unchanged from previous exam. No pleural effusion or pneumothorax. IMPRESSION: Persistent BILATERAL pulmonary infiltrates consistent with multifocal pneumonia and COVID-19. Electronically Signed   By: Lavonia Dana M.D.   On: 08/31/2020 12:45   DG Chest Port 1 View  Result Date: 08/27/2020 CLINICAL DATA:  Shortness of breath EXAM: PORTABLE CHEST 1 VIEW COMPARISON:  08/25/2020 FINDINGS: No significant interval change in AP portable examination with heterogeneous bibasilar airspace opacity and possible small layering pleural effusions. No new airspace opacity. Cardiomegaly. IMPRESSION: No significant interval change in AP portable examination with heterogeneous bibasilar airspace opacity and possible small layering pleural effusions, consistent with multifocal infection. No new airspace opacity.  Electronically Signed   By: Eddie Candle M.D.   On: 08/27/2020 11:04   DG Chest Port 1 View  Result Date: 08/25/2020 CLINICAL DATA:  Shortness of breath.  COVID-19 positive. EXAM: PORTABLE CHEST 1 VIEW COMPARISON:  August 23, 2020. FINDINGS: Stable cardiomediastinal silhouette. No pneumothorax is noted. Stable bilateral lung opacities are noted concerning for multifocal pneumonia. Bony thorax is unremarkable. IMPRESSION: Stable bilateral lung opacities are noted concerning for multifocal pneumonia. Electronically Signed   By: Marijo Conception M.D.   On: 08/25/2020 09:36   DG Chest Port 1 View  Result Date: 08/23/2020 CLINICAL DATA:  Shortness of  breath EXAM: PORTABLE CHEST 1 VIEW COMPARISON:  None. FINDINGS: There is ill-defined airspace opacity in portions of each mid and lower lung region. Heart size and pulmonary vascularity are normal. No adenopathy. There is degenerative change in the left shoulder. IMPRESSION: Ill-defined airspace opacity in the mid and lower lung regions, most indicative of multifocal pneumonia. Question atypical organism pneumonia given this appearance. Check of COVID-19 status in this regard advised. Heart size within normal limits.  No adenopathy appreciable. Electronically Signed   By: Lowella Grip III M.D.   On: 08/23/2020 11:33   DG Abd Portable 1V  Result Date: 09/04/2020 CLINICAL DATA:  Nausea.  COVID positive. EXAM: PORTABLE ABDOMEN - 1 VIEW COMPARISON:  Abdominal radiograph earlier today.  CT 09/01/2020 FINDINGS: Enteric tube in place with tip and side-port below the diaphragm. Drainage catheter projects in the right abdomen. There is no bowel dilatation to suggest obstruction. No radiopaque calculi. Bilateral airspace disease in the included lung bases. IMPRESSION: 1. No evidence of bowel obstruction. 2. Enteric tube in place with tip and side-port below the diaphragm. Drainage catheter in the right abdomen. Electronically Signed   By: Keith Rake M.D.   On:  09/04/2020 16:59   DG Abd Portable 1V  Result Date: 09/04/2020 CLINICAL DATA:  NG tube placement EXAM: PORTABLE ABDOMEN - 1 VIEW COMPARISON:  03/03/2020 FINDINGS: NG tube has been advanced slightly with the tip in the proximal stomach. The side port is in the distal esophagus just above the GE junction. Bilateral lower lobe airspace opacities. IMPRESSION: NG tube tip in the proximal stomach with the side port in the distal esophagus. Electronically Signed   By: Rolm Baptise M.D.   On: 09/04/2020 02:08   DG Abd Portable 1V  Result Date: 09/03/2020 CLINICAL DATA:  Check gastric catheter placement EXAM: PORTABLE ABDOMEN - 1 VIEW COMPARISON:  None. FINDINGS: Gastric catheter is noted in the distal esophagus just short of the gastroesophageal junction. This should be advanced several cm deeper into the stomach. If it has been advanced since the prior chest x-ray from 1242 hours there may be looping in the pharynx. Clinical correlation is recommended. Patchy airspace disease is again identified in both lungs. IMPRESSION: Gastric catheter stable in appearance. This should be advanced several cm as described. Electronically Signed   By: Inez Catalina M.D.   On: 09/03/2020 23:38   ECHOCARDIOGRAM COMPLETE  Result Date: 09/01/2020    ECHOCARDIOGRAM REPORT   Patient Name:   CALLIA SWIM Date of Exam: 09/01/2020 Medical Rec #:  [id number]    Height:       68.0 in Accession #:    [id number]   Weight:       219.8 lb Date of Birth:  1951/02/13    BSA:          2.127 m Patient Age:    35 years     BP:           103/57 mmHg Patient Gender: F            HR:           77 bpm. Exam Location:  Inpatient Procedure: 2D Echo Indications:    atrial fibrillation 427.31  History:        Patient has no prior history of Echocardiogram examinations.                 Risk Factors:Hypertension, Diabetes and Dyslipidemia. Covid +.  Sonographer:    Jannett Celestine RDCS (AE) Referring Phys:  [id number] AMY N COX  Sonographer Comments: Technically  difficult study due to poor echo windows. Restricted mobility. IMPRESSIONS  1. Left ventricular ejection fraction, by estimation, is 70 to 75%. The left ventricle has hyperdynamic function. Left ventricular endocardial border not optimally defined to evaluate regional wall motion, but appears grossly normal. There is mild left ventricular hypertrophy.  2. RV is not well visualized due to image quality. Right ventricular systolic function is mildly reduced. The right ventricular size is mildly enlarged. TR signal inadequate for assessing RVSP.  3. The mitral valve is normal in structure. Trivial mitral valve regurgitation. No evidence of mitral stenosis.  4. The aortic valve is tricuspid. Aortic valve regurgitation is not visualized. No aortic stenosis is present.  5. The inferior vena cava is normal in size with greater than 50% respiratory variability, suggesting right atrial pressure of 3 mmHg. FINDINGS  Left Ventricle: Midcavitary gradient 17.5 mmHg, Vmax 2 m/s. Left ventricular ejection fraction, by estimation, is 70 to 75%. The left ventricle has hyperdynamic function. Left ventricular endocardial border not optimally defined to evaluate regional wall motion. The left ventricular internal cavity size was normal in size. There is mild left ventricular hypertrophy. Left ventricular diastolic parameters are indeterminate. Right Ventricle: RV is not well visualized. The right ventricular size is mildly enlarged. No increase in right ventricular wall thickness. Right ventricular systolic function is mildly reduced. Tricuspid regurgitation signal is inadequate for assessing PA pressure. Left Atrium: Left atrial size was normal in size. Right Atrium: Right atrial size was normal in size. Pericardium: There is no evidence of pericardial effusion. Mitral Valve: The mitral valve is normal in structure. Trivial mitral valve regurgitation. No evidence of mitral valve stenosis. Tricuspid Valve: The tricuspid valve is normal  in structure. Tricuspid valve regurgitation is not demonstrated. No evidence of tricuspid stenosis. Aortic Valve: The aortic valve is tricuspid. Aortic valve regurgitation is not visualized. No aortic stenosis is present. Pulmonic Valve: The pulmonic valve was grossly normal. Pulmonic valve regurgitation is trivial. No evidence of pulmonic stenosis. Aorta: The aortic root is normal in size and structure. Venous: The inferior vena cava is normal in size with greater than 50% respiratory variability, suggesting right atrial pressure of 3 mmHg. IAS/Shunts: No atrial level shunt detected by color flow Doppler.  LEFT VENTRICLE PLAX 2D LVIDd:         3.45 cm LVIDs:         2.30 cm LV PW:         1.20 cm LV IVS:        1.15 cm LVOT diam:     2.20 cm LV SV:         70 LV SV Index:   33 LVOT Area:     3.80 cm  LEFT ATRIUM           Index LA diam:      3.20 cm 1.50 cm/m LA Vol (A2C): 32.9 ml 15.46 ml/m  AORTIC VALVE LVOT Vmax:   101.00 cm/s LVOT Vmean:  85.000 cm/s LVOT VTI:    0.185 m  AORTA Ao Root diam: 2.90 cm MITRAL VALVE MV Area (PHT): 2.95 cm    SHUNTS MV Decel Time: 257 msec    Systemic VTI:  0.18 m MV E velocity: 78.00 cm/s  Systemic Diam: 2.20 cm Cherlynn Kaiser MD Electronically signed by Cherlynn Kaiser MD Signature Date/Time: 09/01/2020/11:02:05 AM    Final    VAS [place] LOWER EXTREMITY VENOUS (DVT)  Result Date: 08/24/2020  Lower  Venous DVTStudy Indications: Elevated d-dimer, covid.  Comparison Study: No prior studies. Performing Technologist: Darlin Coco  Examination Guidelines: A complete evaluation includes B-mode imaging, spectral Doppler, color Doppler, and power Doppler as needed of all accessible portions of each vessel. Bilateral testing is considered an integral part of a complete examination. Limited examinations for reoccurring indications may be performed as noted. The reflux portion of the exam is performed with the patient in reverse Trendelenburg.   +---------+---------------+---------+-----------+----------+--------------+ RIGHT    CompressibilityPhasicitySpontaneityPropertiesThrombus Aging +---------+---------------+---------+-----------+----------+--------------+ CFV      Full           Yes      Yes                                 +---------+---------------+---------+-----------+----------+--------------+ SFJ      Full                                                        +---------+---------------+---------+-----------+----------+--------------+ FV Prox  Full                                                        +---------+---------------+---------+-----------+----------+--------------+ FV Mid   Full                                                        +---------+---------------+---------+-----------+----------+--------------+ FV DistalFull                                                        +---------+---------------+---------+-----------+----------+--------------+ PFV      Full                                                        +---------+---------------+---------+-----------+----------+--------------+ POP      Full           Yes      Yes                                 +---------+---------------+---------+-----------+----------+--------------+ PTV      Full                                                        +---------+---------------+---------+-----------+----------+--------------+ PERO     Full                                                        +---------+---------------+---------+-----------+----------+--------------+   +---------+---------------+---------+-----------+----------+--------------+  LEFT     CompressibilityPhasicitySpontaneityPropertiesThrombus Aging +---------+---------------+---------+-----------+----------+--------------+ CFV      Full           Yes      Yes                                  +---------+---------------+---------+-----------+----------+--------------+ SFJ      Full                                                        +---------+---------------+---------+-----------+----------+--------------+ FV Prox  Full                                                        +---------+---------------+---------+-----------+----------+--------------+ FV Mid   Full                                                        +---------+---------------+---------+-----------+----------+--------------+ FV DistalFull                                                        +---------+---------------+---------+-----------+----------+--------------+ PFV      Full                                                        +---------+---------------+---------+-----------+----------+--------------+ POP      Full           Yes      Yes                                 +---------+---------------+---------+-----------+----------+--------------+ PTV      Full                                                        +---------+---------------+---------+-----------+----------+--------------+ PERO     Full                                                        +---------+---------------+---------+-----------+----------+--------------+     Summary: RIGHT: - There is no evidence of deep vein thrombosis in the lower extremity.  - No cystic structure found in the popliteal fossa.  LEFT: - There is no evidence of deep vein thrombosis in the lower extremity.  - No   cystic structure found in the popliteal fossa.  *See table(s) above for measurements and observations. Electronically signed by Deitra Mayo MD on 08/24/2020 at 5:36:23 PM.    Final    [place] EKG SITE RITE  Result Date: 09/03/2020 If Site Rite image not attached, placement could not be confirmed due to current cardiac rhythm.  [place] EKG SITE RITE  Result Date: 09/03/2020 If Site Rite image not attached, placement  could not be confirmed due to current cardiac rhythm.

## 2020-09-05 NOTE — Progress Notes (Signed)
   09/05/20 1259  Assess: MEWS Score  Temp 97.8 F (36.6 C)  BP 125/76  Pulse Rate (!) 129  ECG Heart Rate (!) 147  Resp (!) 27  Level of Consciousness Alert  SpO2 91 %  O2 Device Room Air  Patient Activity (if Appropriate) In chair  Assess: MEWS Score  MEWS Temp 0  MEWS Systolic 0  MEWS Pulse 3  MEWS RR 2  MEWS LOC 0  MEWS Score 5  MEWS Score Color Red  Assess: if the MEWS score is Yellow or Red  Were vital signs taken at a resting state? Yes  Focused Assessment No change from prior assessment  Early Detection of Sepsis Score *See Row Information* Low  MEWS guidelines implemented *See Row Information* Yes  Treat  MEWS Interventions Escalated (See documentation below)  Pain Scale 0-10  Pain Score 0  Take Vital Signs  Increase Vital Sign Frequency  Red: Q 1hr X 4 then Q 4hr X 4, if remains red, continue Q 4hrs  Escalate  MEWS: Escalate Red: discuss with charge nurse/RN and provider, consider discussing with RRT  Notify: Charge Nurse/RN  Name of Charge Nurse/RN Notified Primitivo Gauze RN  Date Charge Nurse/RN Notified 09/05/20  Time Charge Nurse/RN Notified [id number]  Notify: Provider  Provider Name/Title Dr. Candiss Norse  Date Provider Notified 09/05/20  Time Provider Notified [id number]  Notification Type Page  Notification Reason Other (Comment) (red mews)  Response No new orders  Date of Provider Response 09/05/20  Time of Provider Response [id number]

## 2020-09-05 NOTE — Progress Notes (Signed)
ANTICOAGULATION CONSULT NOTE - Follow Up Consult  Pharmacy Consult for Eliquis to Lovenox Indication: atrial fibrillation  No Known Allergies  Patient Measurements: Height: 5\' 8"  (172.7 cm) Weight: 99.7 kg (219 lb 12.8 oz) IBW/kg (Calculated) : 63.9  Vital Signs: Temp: 98 F (36.7 C) (11/07 0400) Temp Source: Axillary (11/07 0400) BP: 114/76 (11/07 0400) Pulse Rate: 93 (11/07 0400)  Labs: Recent Labs    09/03/20 0054 09/03/20 0054 09/04/20 0514 09/05/20 0355  HGB 8.9*   < > 10.1* 10.1*  HCT 28.7*  --  32.7* 33.2*  PLT 255  --  291 280  CREATININE 1.51*  --  1.35* 1.02*   < > = values in this interval not displayed.    Estimated Creatinine Clearance: 64.3 mL/min (A) (by C-G formula based on SCr of 1.02 mg/dL (H)).  Assessment: 69 year old female with Afib.  On Eliquis on hospital admission reversed with Kcentra 11/3 for emergent repair of perforated duodenal ulcer.  Now pt is on Lovenox as bridge until Eliquis is resumed.  CBC-- Hgb 10.1; Plt 280   Goal of Therapy:  Anti-Xa level 0.6-1 units/ml 4hrs after LMWH dose given Monitor platelets by anticoagulation protocol: Yes   Plan:  Continue with lovenox 100mg  q12 hours Monitor s/sx bleeding, CBC, and transition back to oral anticoagulation as able   Wilson Singer, PharmD PGY1 Pharmacy Resident 09/05/2020 7:24 AM

## 2020-09-05 NOTE — Progress Notes (Signed)
Pt Wound Vac alarmed multiple times for low pressure and blocked. Paged Surgery, Dr. Brantley Stage stated ok to flush or change tubing if necessary. Flushed tubing twice, this shift, vacuum still alarmed. This RN changed entire dressing, and wound vac is working appropriately.

## 2020-09-05 NOTE — Progress Notes (Signed)
[facility] Progress Note  4 Days Post-Op  Subjective: CC-  States that she had some discomfort from abdominal wound vac overnight, otherwise abdominal pain improving. IV tylenol and robaxin were discontinued by primary team yesterday. There are notes that patient was vomiting yesterday. Patient denies any n/v, states that she is coughing up a lot of phlegm. Ileostomy functioning.  WBC normalized 9.7, afebrile.  Objective: Vital signs in last 24 hours: Temp:  [97.9 F (36.6 C)-98.6 F (37 C)] 98.1 F (36.7 C) (11/07 0734) Pulse Rate:  [93-100] 99 (11/07 0734) Resp:  [20-25] 20 (11/07 0734) BP: (114-126)/(54-76) 120/70 (11/07 0734) SpO2:  [93 %-99 %] 94 % (11/07 0734) Last BM Date: 09/04/20  Intake/Output from previous day: 11/06 0701 - 11/07 0700 In: -  Out: 3520 [Urine:3250; Drains:20; Stool:250] Intake/Output this shift: No intake/output data recorded.  PE: Gen: Alert, NAD, pleasant HEENT: EOM's intact, pupils equal and round Card:Irregular rhythm, HR mostly in the 90s but increased up to 140's when she started to cough  Pulm: rate and effort normal on2L [place] PTW:SFKC, nontender, ND, vac to midline incision with good seal and bloody drainage in canister, drain with serous output. Ileostomy viable with thin bilious output in bag  Lab Results:  Recent Labs    09/04/20 0514 09/05/20 0355  WBC 12.4* 9.7  HGB 10.1* 10.1*  HCT 32.7* 33.2*  PLT 291 280   BMET Recent Labs    09/04/20 0514 09/05/20 0355  NA 150* 153*  K 3.9 4.0  CL 117* 119*  CO2 26 27  GLUCOSE 402* 261*  BUN 40* 37*  CREATININE 1.35* 1.02*  CALCIUM 9.7 9.4   PT/INR No results for input(s): LABPROT, INR in the last 72 hours. CMP     Component Value Date/Time   NA 153 (H) 09/05/2020 0355   K 4.0 09/05/2020 0355   CL 119 (H) 09/05/2020 0355   CO2 27 09/05/2020 0355   GLUCOSE 261 (H) 09/05/2020 0355   BUN 37 (H) 09/05/2020 0355   CREATININE 1.02 (H) 09/05/2020 0355    CALCIUM 9.4 09/05/2020 0355   PROT 5.0 (L) 09/05/2020 0355   ALBUMIN 1.8 (L) 09/05/2020 0355   AST 35 09/05/2020 0355   ALT 22 09/05/2020 0355   ALKPHOS 91 09/05/2020 0355   BILITOT 0.4 09/05/2020 0355   GFRNONAA 60 (L) 09/05/2020 0355   Lipase     Component Value Date/Time   LIPASE 48 08/31/2020 2054       Studies/Results: DG CHEST PORT 1 VIEW  Result Date: 09/03/2020 CLINICAL DATA:  PICC line placement.  COVID 19 positive. EXAM: PORTABLE CHEST 1 VIEW COMPARISON:  Chest x-ray dated August 31, 2020. FINDINGS: New right upper extremity PICC line with the tip in the distal SVC. Enteric tube tip at the gastroesophageal junction. Stable cardiomediastinal silhouette. Bilateral mid and lower lung patchy opacities are not significantly changed. Postsurgical changes in the right upper lobe again noted. No pleural effusion or pneumothorax. No acute osseous abnormality. IMPRESSION: 1. New right upper extremity PICC line with tip in the distal SVC. 2. Enteric tube tip at the gastroesophageal junction. Recommend advancement. 3. Unchanged multifocal pneumonia. Electronically Signed   By: Titus Dubin M.D.   On: 09/03/2020 13:00   DG Abd Portable 1V  Result Date: 09/04/2020 CLINICAL DATA:  Nausea.  COVID positive. EXAM: PORTABLE ABDOMEN - 1 VIEW COMPARISON:  Abdominal radiograph earlier today.  CT 09/01/2020 FINDINGS: Enteric tube in place with tip and side-port below the diaphragm. Drainage  catheter projects in the right abdomen. There is no bowel dilatation to suggest obstruction. No radiopaque calculi. Bilateral airspace disease in the included lung bases. IMPRESSION: 1. No evidence of bowel obstruction. 2. Enteric tube in place with tip and side-port below the diaphragm. Drainage catheter in the right abdomen. Electronically Signed   By: Keith Rake M.D.   On: 09/04/2020 16:59   DG Abd Portable 1V  Result Date: 09/04/2020 CLINICAL DATA:  NG tube placement EXAM: PORTABLE ABDOMEN - 1 VIEW  COMPARISON:  03/03/2020 FINDINGS: NG tube has been advanced slightly with the tip in the proximal stomach. The side port is in the distal esophagus just above the GE junction. Bilateral lower lobe airspace opacities. IMPRESSION: NG tube tip in the proximal stomach with the side port in the distal esophagus. Electronically Signed   By: Rolm Baptise M.D.   On: 09/04/2020 02:08   DG Abd Portable 1V  Result Date: 09/03/2020 CLINICAL DATA:  Check gastric catheter placement EXAM: PORTABLE ABDOMEN - 1 VIEW COMPARISON:  None. FINDINGS: Gastric catheter is noted in the distal esophagus just short of the gastroesophageal junction. This should be advanced several cm deeper into the stomach. If it has been advanced since the prior chest x-ray from 1242 hours there may be looping in the pharynx. Clinical correlation is recommended. Patchy airspace disease is again identified in both lungs. IMPRESSION: Gastric catheter stable in appearance. This should be advanced several cm as described. Electronically Signed   By: Inez Catalina M.D.   On: 09/03/2020 23:38    Anti-infectives: Anti-infectives (From admission, onward)   Start     Dose/Rate Route Frequency Ordered Stop   09/02/20 1600  cefTRIAXone (ROCEPHIN) 1 g in sodium chloride 0.9 % 100 mL IVPB  Status:  Discontinued        1 g 200 mL/hr over 30 Minutes Intravenous Every 24 hours 09/01/20 1811 09/02/20 0838   09/01/20 1800  fluconazole (DIFLUCAN) IVPB 400 mg        400 mg 50 mL/hr over 240 Minutes Intravenous  Once 09/01/20 1749 09/02/20 0603   09/01/20 1530  piperacillin-tazobactam (ZOSYN) IVPB 3.375 g        3.375 g 12.5 mL/hr over 240 Minutes Intravenous Every 8 hours 09/01/20 1514 09/05/20 2359   09/01/20 1000  levofloxacin (LEVAQUIN) tablet 250 mg  Status:  Discontinued        250 mg Oral Daily 08/31/20 1508 08/31/20 1735   08/31/20 1730  cefTRIAXone (ROCEPHIN) 1 g in sodium chloride 0.9 % 100 mL IVPB  Status:  Discontinued        1 g 200 mL/hr over  30 Minutes Intravenous Every 24 hours 08/31/20 1726 09/01/20 1513   08/31/20 1730  azithromycin (ZITHROMAX) 500 mg in sodium chloride 0.9 % 250 mL IVPB  Status:  Discontinued        500 mg 250 mL/hr over 60 Minutes Intravenous Every 24 hours 08/31/20 1726 09/02/20 0838       Assessment/Plan HTN HLD DM Hx colon cancer s/p partial colectomy/colostomy followed by completion colectomy and ileostomy Hypoxia requiring 3L O2 Covid-19 pneumonia  Elevated D dimer on eliquis -eliquis reversed with kcentra, hold eliquis A fib RVR Anemia - Hgb 10.1 from 8.9, stable 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 Bobbye Morton -POD#4 - Continue NPO/NGT to LIWS, nothing per NG - plan for UGI on POD#5 (  tomorrow) prior to removing NG or advancing diet - continue PPI BID - will completed 4 days IV zosyn today - continue JP drain and monitor output - vac MWF - continue PT/OT, mobilize. Therapies currently recommending SNF but patient/family have refused in the past  ID -zosyn 11/3>>day#4/4 VTE -therapeutic lovenox FEN -IVF, NPO/NGT to LIWS Foley -d/c 11/5 Follow up -Dr. Chrystie Nose - daughter [name] [phone]    LOS: 4 days    Wellington Hampshire, [facility] 09/05/2020, 8:15 AM Please see Amion for pager number during day hours 7:00am-4:30pm

## 2020-09-06 ENCOUNTER — Other Ambulatory Visit: Payer: Self-pay

## 2020-09-06 ENCOUNTER — Inpatient Hospital Stay (HOSPITAL_COMMUNITY): Payer: Medicare Other

## 2020-09-06 ENCOUNTER — Encounter (HOSPITAL_COMMUNITY): Payer: Self-pay | Admitting: Internal Medicine

## 2020-09-06 DIAGNOSIS — I4891 Unspecified atrial fibrillation: Secondary | ICD-10-CM | POA: Diagnosis not present

## 2020-09-06 LAB — COMPREHENSIVE METABOLIC PANEL
ALT: 43 U/L (ref 0–44)
AST: 60 U/L — ABNORMAL HIGH (ref 15–41)
Albumin: 1.8 g/dL — ABNORMAL LOW (ref 3.5–5.0)
Alkaline Phosphatase: 114 U/L (ref 38–126)
Anion gap: 6 (ref 5–15)
BUN: 32 mg/dL — ABNORMAL HIGH (ref 8–23)
CO2: 26 mmol/L (ref 22–32)
Calcium: 9.4 mg/dL (ref 8.9–10.3)
Chloride: 118 mmol/L — ABNORMAL HIGH (ref 98–111)
Creatinine, Ser: 0.86 mg/dL (ref 0.44–1.00)
GFR, Estimated: >60 mL/min (ref >60)
Glucose, Bld: 255 mg/dL — ABNORMAL HIGH (ref 70–99)
Potassium: 4.7 mmol/L (ref 3.5–5.1)
Sodium: 150 mmol/L — ABNORMAL HIGH (ref 135–145)
Total Bilirubin: 0.1 mg/dL — ABNORMAL LOW (ref 0.3–1.2)
Total Protein: 5.1 g/dL — ABNORMAL LOW (ref 6.5–8.1)

## 2020-09-06 LAB — DIFFERENTIAL
Abs Immature Granulocytes: 0.22 10*3/uL — ABNORMAL HIGH (ref 0.00–0.07)
Basophils Absolute: 0 10*3/uL (ref 0.0–0.1)
Basophils Relative: 0 %
Eosinophils Absolute: 0 10*3/uL (ref 0.0–0.5)
Eosinophils Relative: 0 %
Immature Granulocytes: 2 %
Lymphocytes Relative: 4 %
Lymphs Abs: 0.5 10*3/uL — ABNORMAL LOW (ref 0.7–4.0)
Monocytes Absolute: 0.7 10*3/uL (ref 0.1–1.0)
Monocytes Relative: 6 %
Neutro Abs: 9.6 10*3/uL — ABNORMAL HIGH (ref 1.7–7.7)
Neutrophils Relative %: 88 %

## 2020-09-06 LAB — CBC
HCT: 33.1 % — ABNORMAL LOW (ref 36.0–46.0)
Hemoglobin: 9.9 g/dL — ABNORMAL LOW (ref 12.0–15.0)
MCH: 22.7 pg — ABNORMAL LOW (ref 26.0–34.0)
MCHC: 29.9 g/dL — ABNORMAL LOW (ref 30.0–36.0)
MCV: 75.7 fL — ABNORMAL LOW (ref 80.0–100.0)
Platelets: 248 10*3/uL (ref 150–400)
RBC: 4.37 MIL/uL (ref 3.87–5.11)
RDW: 15.6 % — ABNORMAL HIGH (ref 11.5–15.5)
WBC: 11 10*3/uL — ABNORMAL HIGH (ref 4.0–10.5)
nRBC: 0.5 % — ABNORMAL HIGH (ref 0.0–0.2)

## 2020-09-06 LAB — GLUCOSE, CAPILLARY
Glucose-Capillary: 212 mg/dL — ABNORMAL HIGH (ref 70–99)
Glucose-Capillary: 222 mg/dL — ABNORMAL HIGH (ref 70–99)
Glucose-Capillary: 260 mg/dL — ABNORMAL HIGH (ref 70–99)
Glucose-Capillary: 275 mg/dL — ABNORMAL HIGH (ref 70–99)
Glucose-Capillary: 212 mg/dL — ABNORMAL HIGH (ref 70–99)

## 2020-09-06 LAB — BRAIN NATRIURETIC PEPTIDE: B Natriuretic Peptide: 316.8 pg/mL — ABNORMAL HIGH (ref 0.0–100.0)

## 2020-09-06 LAB — TRIGLYCERIDES: Triglycerides: 188 mg/dL — ABNORMAL HIGH (ref <150)

## 2020-09-06 LAB — PHOSPHORUS: Phosphorus: 2.5 mg/dL (ref 2.5–4.6)

## 2020-09-06 LAB — PROCALCITONIN: Procalcitonin: 0.67 ng/mL

## 2020-09-06 LAB — C-REACTIVE PROTEIN: CRP: 1.4 mg/dL — ABNORMAL HIGH (ref <1.0)

## 2020-09-06 LAB — PREALBUMIN: Prealbumin: 26.3 mg/dL (ref 18–38)

## 2020-09-06 LAB — MAGNESIUM: Magnesium: 2 mg/dL (ref 1.7–2.4)

## 2020-09-06 LAB — D-DIMER, QUANTITATIVE: D-Dimer, Quant: 1.43 ug/mL-FEU — ABNORMAL HIGH (ref 0.00–0.50)

## 2020-09-06 MED ORDER — IOHEXOL 300 MG/ML  SOLN
150.0000 mL | Freq: Once | INTRAMUSCULAR | Status: AC | PRN
  Administered 2020-09-06: 80 mL

## 2020-09-06 MED ORDER — TRAVASOL 10 % IV SOLN
INTRAVENOUS | Status: AC
  Filled 2020-09-06: qty 547.2

## 2020-09-06 MED ORDER — DEXTROSE 5 % IV SOLN: INTRAVENOUS | Status: DC

## 2020-09-06 MED ORDER — DIGOXIN 0.25 MG/ML IJ SOLN
0.2500 mg | Freq: Once | INTRAMUSCULAR | Status: AC
  Administered 2020-09-06: 0.25 mg via INTRAVENOUS
  Filled 2020-09-06: qty 2

## 2020-09-06 NOTE — Progress Notes (Signed)
Occupational Therapy Treatment Patient Details Name: Candace [name] MRN: [id number] DOB: August 09, 1951 Today's Date: 09/06/2020    History of present illness  69 y.o. female with medical history significant for recent covid pna, obesity, colon cancer s/p colon resection with colostomy bag, HLD, NIDDM2, CKD3 presented to ED after her follow-up nurse advised her to present to ED for elevated HR. +afib, elevated troponins with demand ischemia,  CT scan is positive for bowel perforation with pneumomediastinum 11/03 exp lap with repair of perforated ulcer   OT comments  Pt continues to present with decreased activity tolerance. Pt requiring Mod A +2 for bed mobility. Once at EOB, pt with elevated RR into 40s and SpO2 dropping to 83% on 2L; elevating to 3L and then 4L. However, SpO2 dropping to 80s when pt RR in 53s,. Pt requiring Max cues for purse lip breathing but presenting with poor sequencing and problem solving. Return to supine with Mod A +2. Continue to recommend dc to SNF and will continue to follow acutely as admitted.   On arrival, on 2L with sats 90s  -sit EOB, RR 40s sats 82%, increased up to 3L and then 4L -pt with great difficulty trying to reduce RR, however when RR 30s sats would incr 88-90% -HRmax 133 bpm    Follow Up Recommendations  SNF;Other (comment)    Equipment Recommendations  None recommended by OT    Recommendations for Other Services PT consult    Precautions / Restrictions Precautions Precautions: Fall Precaution Comments: baseline colostomy       Mobility Bed Mobility Overal bed mobility: Needs Assistance Bed Mobility: Rolling;Sidelying to Sit;Sit to Sidelying Rolling: Mod assist;+2 for physical assistance Sidelying to sit: Mod assist;+2 for physical assistance;+2 for safety/equipment     Sit to sidelying: Mod assist;+2 for physical assistance General bed mobility comments: pt reporting feeling very weak and ?giving her best effort  Transfers                  General transfer comment: pt unable to recover in sitting (incr RR, decr sats, incr HR) with return to supine    Balance Overall balance assessment: Needs assistance Sitting-balance support: Feet supported;Bilateral upper extremity supported Sitting balance-Leahy Scale: Poor Sitting balance - Comments: supportive assist varied min to max assist                                   ADL either performed or assessed with clinical judgement   ADL Overall ADL's : Needs assistance/impaired Eating/Feeding: NPO   Grooming: Supervision/safety;Set up;Bed level;Oral care Grooming Details (indicate cue type and reason): Pt applying moisturizer to lips with swap at bed level                               General ADL Comments: Focused session on sitting at EOB and attempting transfer to chair. However, pt with increased RR impacting her safe performance of mobility.     Vision       Perception     Praxis      Cognition Arousal/Alertness: Awake/alert Behavior During Therapy: Flat affect Overall Cognitive Status: Impaired/Different from baseline Area of Impairment: Attention;Following commands;Safety/judgement;Awareness;Problem solving;Memory                 Orientation Level:  (NT) Current Attention Level: Sustained Memory: Decreased short-term memory Following Commands: Follows one step commands with increased  time;Follows one step commands inconsistently Safety/Judgement: Decreased awareness of safety;Decreased awareness of deficits Awareness: Intellectual Problem Solving: Slow processing;Decreased initiation;Requires verbal cues;Difficulty sequencing General Comments: Patient unable to follow instructions for pursed lip breathing or for slowing her breathing down.         Exercises Exercises: Other exercises Other Exercises Other Exercises: Educated on use of IS with pt pulling only 250-500 ml x 4 reps. Difficulty attending to  task Other Exercises: Educated repeatedly on use of pursed lip breathing in effort to slow her rate and incr her O2 sats. She continued to breathe in and out of her nose. Once back in bed, attempted re-education holding up 1 finger for pt to pretend it was a candle to blow out and she was finally able to blow out via mouth.    Shoulder Instructions       General Comments On arrival, on 2L with sats 90s. sit EOB, RR 40s sats 82%, increased up to 3L and then 4L. Pt with great difficulty trying to reduce RR, however when RR 30s sats would incr 88-90%. HRmax 133 bpm    Pertinent Vitals/ Pain       Pain Assessment: Faces Faces Pain Scale: Hurts little more Pain Location: abd Pain Descriptors / Indicators: Guarding;Operative site guarding Pain Intervention(s): Monitored during session;Limited activity within patient's tolerance;Repositioned  Home Living                                          Prior Functioning/Environment              Frequency  Min 3X/week        Progress Toward Goals  OT Goals(current goals can now be found in the care plan section)  Progress towards OT goals: Progressing toward goals  Acute Rehab OT Goals Patient Stated Goal: get better and go home OT Goal Formulation: With patient Time For Goal Achievement: 09/16/20 Potential to Achieve Goals: Good ADL Goals Pt Will Perform Grooming: with supervision;with set-up;sitting (At sink) Pt Will Perform Upper Body Dressing: with set-up;with supervision;sitting Pt Will Transfer to Toilet: with min guard assist;ambulating;regular height toilet Pt Will Perform Toileting - Clothing Manipulation and hygiene: with min guard assist;sitting/lateral leans;sit to/from stand Additional ADL Goal #1: Pt will verbalize three energy conservation techniques for ADLs with Min cues Additional ADL Goal #2: Pt will perform bed mobility with Min Guard A in preparation for ADLs  Plan Discharge plan remains  appropriate    Co-evaluation    PT/OT/SLP Co-Evaluation/Treatment: Yes Reason for Co-Treatment: For patient/therapist safety;To address functional/ADL transfers;Complexity of the patient's impairments (multi-system involvement) (cardiopulmonary tolerance) PT goals addressed during session: Mobility/safety with mobility;Balance OT goals addressed during session: ADL's and self-care      AM-PAC OT "6 Clicks" Daily Activity     Outcome Measure   Help from another person eating meals?: Total Help from another person taking care of personal grooming?: A Little Help from another person toileting, which includes using toliet, bedpan, or urinal?: A Lot Help from another person bathing (including washing, rinsing, drying)?: A Lot Help from another person to put on and taking off regular upper body clothing?: A Little Help from another person to put on and taking off regular lower body clothing?: A Lot 6 Click Score: 13    End of Session Equipment Utilized During Treatment: Oxygen (2L)  OT Visit Diagnosis: Unsteadiness on feet (R26.81);Other abnormalities  of gait and mobility (R26.89);Muscle weakness (generalized) (M62.81);Other symptoms and signs involving cognitive function   Activity Tolerance Patient tolerated treatment well;Patient limited by fatigue   Patient Left in chair;with call bell/phone within reach   Nurse Communication Mobility status        Time: 6144-3154 OT Time Calculation (min): 50 min  Charges: OT General Charges $OT Visit: 1 Visit OT Treatments $Therapeutic Activity: 8-22 mins  [place], OTR/L Acute Rehab Pager: [phone] Office: [place] 09/06/2020, 5:18 PM

## 2020-09-06 NOTE — Progress Notes (Signed)
Physical Therapy Treatment Patient Details Name: Candace [name] MRN: [id number] DOB: Mar 04, 1951 Today's Date: 09/06/2020    History of Present Illness  69 y.o. female with medical history significant for recent covid pna, obesity, colon cancer s/p colon resection with colostomy bag, HLD, NIDDM2, CKD3 presented to ED after her follow-up nurse advised her to present to ED for elevated HR. +afib, elevated troponins with demand ischemia,  CT scan is positive for bowel perforation with pneumomediastinum 11/03 exp lap with repair of perforated ulcer    PT Comments    Patient with overall decr tolerance for activity. She was only able to sit EOB with up to max assist for balance while resting/reclining trying to reduce her RR and improve her sats. Patient was unable to follow cues for PLB or slowing her RR. Ultimately had to return to bed and placed in chair-like position. She continues to have difficulty with IS pulling only 250-500 ml.   On arrival, on 2L with sats 90s  -sit EOB, RR 40s sats 82%, increased up to 3L and then 4L -pt with great difficulty trying to reduce RR, however when RR 30s sats would incr 88-90% -HRmax 133 bpm   Follow Up Recommendations  SNF;Supervision/Assistance - 24 hour (pt/family may again refuse SNF)     Equipment Recommendations  None recommended by PT    Recommendations for Other Services       Precautions / Restrictions Precautions Precautions: Fall Precaution Comments: baseline colostomy    Mobility  Bed Mobility Overal bed mobility: Needs Assistance Bed Mobility: Rolling;Sidelying to Sit;Sit to Sidelying Rolling: Mod assist;+2 for physical assistance Sidelying to sit: Mod assist;+2 for physical assistance;+2 for safety/equipment     Sit to sidelying: Mod assist;+2 for physical assistance General bed mobility comments: pt reporting feeling very weak and ?giving her best effort  Transfers                 General transfer comment: pt unable  to recover in sitting (incr RR, decr sats, incr HR) with return to supine  Ambulation/Gait                 Stairs             Wheelchair Mobility    Modified Rankin (Stroke Patients Only)       Balance Overall balance assessment: Needs assistance Sitting-balance support: Feet supported;Bilateral upper extremity supported Sitting balance-Leahy Scale: Poor Sitting balance - Comments: supportive assist varied min to max assist                                    Cognition Arousal/Alertness: Awake/alert Behavior During Therapy: Flat affect Overall Cognitive Status: Impaired/Different from baseline Area of Impairment: Attention;Following commands;Safety/judgement;Awareness;Problem solving;Memory                 Orientation Level:  (NT) Current Attention Level: Sustained Memory: Decreased short-term memory Following Commands: Follows one step commands with increased time;Follows one step commands inconsistently Safety/Judgement: Decreased awareness of safety;Decreased awareness of deficits Awareness: Intellectual Problem Solving: Slow processing;Decreased initiation;Requires verbal cues;Difficulty sequencing General Comments: Patient unable to follow instructions for pursed lip breathing or for slowing her breathing down.       Exercises Other Exercises Other Exercises: Educated on use of IS with pt pulling only 250-500 ml x 4 reps. Difficulty attending to task Other Exercises: Educated repeatedly on use of pursed lip breathing in effort to slow her  rate and incr her O2 sats. She continued to breathe in and out of her nose. Once back in bed, attempted re-education holding up 1 finger for pt to pretend it was a candle to blow out and she was finally able to blow out via mouth.     General Comments        Pertinent Vitals/Pain Pain Assessment: Faces Faces Pain Scale: Hurts little more Pain Location: abd Pain Descriptors / Indicators:  Guarding;Operative site guarding Pain Intervention(s): Monitored during session;Repositioned    Home Living                      Prior Function            PT Goals (current goals can now be found in the care plan section) Acute Rehab PT Goals Patient Stated Goal: get better and go home Time For Goal Achievement: 09/16/20 Potential to Achieve Goals: Good Progress towards PT goals: Not progressing toward goals - comment (decr respiratory status)    Frequency    Min 3X/week (unless confirm SNF)      PT Plan Current plan remains appropriate    Co-evaluation PT/OT/SLP Co-Evaluation/Treatment: Yes Reason for Co-Treatment: Complexity of the patient's impairments (multi-system involvement);For patient/therapist safety;To address functional/ADL transfers;Other (comment) (cardiopulmonary tolerance) PT goals addressed during session: Mobility/safety with mobility;Balance        AM-PAC PT "6 Clicks" Mobility   Outcome Measure  Help needed turning from your back to your side while in a flat bed without using bedrails?: A Lot Help needed moving from lying on your back to sitting on the side of a flat bed without using bedrails?: A Lot Help needed moving to and from a bed to a chair (including a wheelchair)?: A Lot Help needed standing up from a chair using your arms (e.g., wheelchair or bedside chair)?: A Lot Help needed to walk in hospital room?: Total Help needed climbing 3-5 steps with a railing? : Total 6 Click Score: 10    End of Session Equipment Utilized During Treatment: Oxygen Activity Tolerance: Patient limited by fatigue;Treatment limited secondary to medical complications (Comment) (incr RR, HR; decr sats) Patient left: with call bell/phone within reach;in bed;with bed alarm set   PT Visit Diagnosis: Muscle weakness (generalized) (M62.81);Difficulty in walking, not elsewhere classified (R26.2);Pain Pain - part of body:  (abdomen)     Time: 3329-5188 PT  Time Calculation (min) (ACUTE ONLY): 50 min  Charges:  $Therapeutic Activity: 8-22 mins $Self Care/Home Management: 8-22                      Arby Barrette, PT Pager [phone]    Rexanne Mano 09/06/2020, 4:30 PM

## 2020-09-06 NOTE — Care Management (Addendum)
Case Manager reached out to Candace [name] with [facility] as requested by Dr. Candiss Norse. Patient has low acuity for them right now, They will start insurance auth as she nears end of isolation and if approved, surgery will need to sign off before they could take care of patient. Donella Stade Team will continue to monitor. Ricki Miller, RN BSN Case Manager [phone]

## 2020-09-06 NOTE — Progress Notes (Signed)
[facility] Progress Note  5 Days Post-Op  Subjective: CC-  Main complaint today is pain from NG tube. Denies abdominal pain, n/v. Ileostomy functioning.  WBC slightly up 11, afebrile. Going for UGI at 1400 today.  Objective: Vital signs in last 24 hours: Temp:  [97.6 F (36.4 C)-98.4 F (36.9 C)] 98 F (36.7 C) (11/08 0912) Pulse Rate:  [98-129] 100 (11/08 0912) Resp:  [18-37] 25 (11/08 0912) BP: (106-135)/(63-93) 106/68 (11/08 0912) SpO2:  [91 %-100 %] 100 % (11/08 0912) Last BM Date: 09/04/20  Intake/Output from previous day: 11/07 0701 - 11/08 0700 In: 1697.3 [I.V.:1147.3; NG/GT:150; IV Piggyback:400] Out: 3300 [Urine:3200; Drains:50; Stool:50] Intake/Output this shift: Total I/O In: -  Out: 8003 [Urine:1325]  PE: Gen: Alert, NAD, pleasant Card:mild tachy low 100s, Irregular rhythm Pulm: rate and effort normal on2L  KJZ:PHXT, nontender, ND,vac to midline incision with good seal and no drainage in canister,drain with serous output. Ileostomy viable with thin bilious output in bag   Lab Results:  Recent Labs    09/05/20 0355 09/06/20 0320  WBC 9.7 11.0*  HGB 10.1* 9.9*  HCT 33.2* 33.1*  PLT 280 248   BMET Recent Labs    09/05/20 0355 09/06/20 0320  NA 153* 150*  K 4.0 4.7  CL 119* 118*  CO2 27 26  GLUCOSE 261* 255*  BUN 37* 32*  CREATININE 1.02* 0.86  CALCIUM 9.4 9.4   PT/INR No results for input(s): LABPROT, INR in the last 72 hours. CMP     Component Value Date/Time   NA 150 (H) 09/06/2020 0320   K 4.7 09/06/2020 0320   CL 118 (H) 09/06/2020 0320   CO2 26 09/06/2020 0320   GLUCOSE 255 (H) 09/06/2020 0320   BUN 32 (H) 09/06/2020 0320   CREATININE 0.86 09/06/2020 0320   CALCIUM 9.4 09/06/2020 0320   PROT 5.1 (L) 09/06/2020 0320   ALBUMIN 1.8 (L) 09/06/2020 0320   AST 60 (H) 09/06/2020 0320   ALT 43 09/06/2020 0320   ALKPHOS 114 09/06/2020 0320   BILITOT 0.1 (L) 09/06/2020 0320   GFRNONAA >60 09/06/2020 0320    Lipase     Component Value Date/Time   LIPASE 48 08/31/2020 2054       Studies/Results: DG Abd 1 View  Result Date: 09/05/2020 CLINICAL DATA:  Nausea EXAM: ABDOMEN - 1 VIEW COMPARISON:  09/04/2020 abdominal radiograph FINDINGS: Enteric tube terminates in the body of the stomach. No dilated small bowel loops. Minimal colonic gas. No evidence of pneumatosis or pneumoperitoneum. Degenerative changes in the lumbar spine. No radiopaque nephrolithiasis. IMPRESSION: Enteric tube terminates in the body of the stomach. Nonobstructive bowel gas pattern. Electronically Signed   By: Ilona Sorrel M.D.   On: 09/05/2020 08:33   DG Chest Port 1 View  Result Date: 09/06/2020 CLINICAL DATA:  Shortness of breath and cough. History of COVID-19 positive status EXAM: PORTABLE CHEST 1 VIEW COMPARISON:  September 05, 2020 FINDINGS: Central catheter tip is in the superior vena cava. Nasogastric tube tip and side port are below the diaphragm. No pneumothorax. Airspace opacity is again noted in the mid and lower lung regions, similar to 1 day prior. Heart is upper normal in size with pulmonary vascularity normal. No adenopathy. There is aortic atherosclerosis. There is degenerative change in the left shoulder region. IMPRESSION: Tube and catheter positions as described without pneumothorax. Airspace opacity consistent with multifocal pneumonia bilaterally, stable. No new opacity evident. Stable cardiac silhouette. Aortic Atherosclerosis (ICD10-I70.0). Electronically Signed   By:  Lowella Grip III M.D.   On: 09/06/2020 08:03   DG Chest Port 1 View  Result Date: 09/05/2020 CLINICAL DATA:  Dyspnea, atrial fibrillation, COVID positive EXAM: PORTABLE CHEST 1 VIEW COMPARISON:  09/03/2020 chest radiograph. FINDINGS: Enteric tube enters stomach with the tip not seen on this image. Right PICC terminates over the lower third of the SVC. Stable cardiomediastinal silhouette with normal heart size. No pneumothorax. No pleural  effusion. Extensive patchy opacities throughout the mid to lower lungs bilaterally, not substantially changed. IMPRESSION: Extensive patchy opacities throughout the mid to lower lungs bilaterally, not substantially changed, compatible with COVID-19 pneumonia. Electronically Signed   By: Ilona Sorrel M.D.   On: 09/05/2020 08:34   DG Abd Portable 1V  Result Date: 09/04/2020 CLINICAL DATA:  Nausea.  COVID positive. EXAM: PORTABLE ABDOMEN - 1 VIEW COMPARISON:  Abdominal radiograph earlier today.  CT 09/01/2020 FINDINGS: Enteric tube in place with tip and side-port below the diaphragm. Drainage catheter projects in the right abdomen. There is no bowel dilatation to suggest obstruction. No radiopaque calculi. Bilateral airspace disease in the included lung bases. IMPRESSION: 1. No evidence of bowel obstruction. 2. Enteric tube in place with tip and side-port below the diaphragm. Drainage catheter in the right abdomen. Electronically Signed   By: Keith Rake M.D.   On: 09/04/2020 16:59    Anti-infectives: Anti-infectives (From admission, onward)   Start     Dose/Rate Route Frequency Ordered Stop   09/02/20 1600  cefTRIAXone (ROCEPHIN) 1 g in sodium chloride 0.9 % 100 mL IVPB  Status:  Discontinued        1 g 200 mL/hr over 30 Minutes Intravenous Every 24 hours 09/01/20 1811 09/02/20 0838   09/01/20 1800  fluconazole (DIFLUCAN) IVPB 400 mg        400 mg 50 mL/hr over 240 Minutes Intravenous  Once 09/01/20 1749 09/02/20 0603   09/01/20 1530  piperacillin-tazobactam (ZOSYN) IVPB 3.375 g        3.375 g 12.5 mL/hr over 240 Minutes Intravenous Every 8 hours 09/01/20 1514 09/05/20 2111   09/01/20 1000  levofloxacin (LEVAQUIN) tablet 250 mg  Status:  Discontinued        250 mg Oral Daily 08/31/20 1508 08/31/20 1735   08/31/20 1730  cefTRIAXone (ROCEPHIN) 1 g in sodium chloride 0.9 % 100 mL IVPB  Status:  Discontinued        1 g 200 mL/hr over 30 Minutes Intravenous Every 24 hours 08/31/20 1726 09/01/20  1513   08/31/20 1730  azithromycin (ZITHROMAX) 500 mg in sodium chloride 0.9 % 250 mL IVPB  Status:  Discontinued        500 mg 250 mL/hr over 60 Minutes Intravenous Every 24 hours 08/31/20 1726 09/02/20 0838       Assessment/Plan HTN HLD DM Hx colon cancer s/p partial colectomy/colostomy followed by completion colectomy and ileostomy Hypoxia requiring 3L O2 Covid-19 pneumonia  Elevated D dimer on eliquis -eliquis reversed with kcentra, hold eliquis A fib RVR Anemia - Hgb9.9 from 10.1, stable 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 Bobbye Morton -POD#5 - continue PPI BID - completed 4 days IV zosyn  - continue JP drain and monitor output - vac MWF - continue PT/OT, mobilize. Therapies currently recommending SNF but patient/family have refused in the past - Continue NPO/NGT to LIWS, nothing per NG - UGI today  ID -zosyn 11/3>>11/8 VTE -therapeutic lovenox FEN -IVF, NPO/NGT to LIWS Foley -d/c 11/5 Follow up -Dr. Chrystie Nose - daughter [name] [phone]   LOS: 5 days    Wellington Hampshire, [facility] 09/06/2020, 10:02 AM Please see Amion for pager number during day hours 7:00am-4:30pm

## 2020-09-06 NOTE — Progress Notes (Signed)
PROGRESS NOTE                                                                                                                                                                                                             Patient Demographics:    Candace [name], is a 69 y.o. female, DOB - 01-07-1951, ZOX:[id number]  Outpatient Primary MD for the patient is [name], [name], MD   Admit date - 08/31/2020   LOS - 5  CC - fast Heart rate     Brief Narrative: Patient is a 69 y.o. female was recently hospitalized for COVID-19 pneumonia-and discharged on 2-3 L of home O2-presents with worsening shortness of breath-found to have A. fib with RVR, ?? worsening hypoxia requiring up to 6 L of oxygen ( down to 2 - 3 lits within 12 hrs) and subsequently admitted to the hospitalist service.  Post admission-patient developed severe upper abdominal pain-subsequent further imaging studies revealed perforated viscus-she underwent a laparotomy on 11/3 which showed a perforated duodenal ulcer.  See below for further details.  COVID-19 vaccinated status: Unvaccinated  Significant Events: 10/25-10/31>> hospitalization for COVID-19 pneumonia-discharged on 2-3 L of oxygen. 11/2>> Admit to [facility] for worsening hypoxemia requiring 6 L of oxygen, A. fib with RVR 11/3>> developed abdominal pain-CT abdomen positive for perforated viscus-underwent emergent laparotomy  Significant studies: 10/26>> bilateral lower extremity Doppler: No DVT. 10/27>> renal ultrasound: Cystic changes at the renal hila likely parapelvic cysts.  Hydronephrosis felt to be less likely. 11/2>>Chest x-ray: Persistent bilateral pulmonary infiltrates consistent with multifocal pneumonia 11/2>> CTA chest: No PE, areas of consolidation and groundglass density bilaterally. 11/3>> Echo: EF 70-75% 11/3>> CT abdomen/pelvis: Pneumoperitoneum/intermediate density free fluid in the abdomen-positive for  bowel perforation.  COVID-19 medications: Steroids: Resume 11/2>> Remdesivir: 10/25>> 10/29  Antibiotics: Rocephin: 11/2 x1 Zithromax: 11/12>> 11/3 Zosyn: 11/3>>  Microbiology data: 10/25 >>blood culture: No growth  Procedures: 11/12>> ex lap-modified Phillip Heal patch repair of perforated duodenal ulcer.  Consults: None  DVT prophylaxis: Place TED hose Start: 08/31/20 1711 Place TED hose Start: 08/31/20 1507 Started on therapeutic Lovenox on 11/4    Subjective:   Patient in bed, appears comfortable, denies any headache, no fever, no chest pain or pressure, no shortness of breath , no abdominal pain. No focal weakness.   Assessment  & Plan :   Acute Hypoxic Resp  Failure due to ongoing COVID-19 related pneumonitis: Stable on just 2 L of oxygen-on steroids.  Doubt bacterial pneumonia-procalcitonin levels are normal-she is on Zosyn to cover her intra-abdominal issues.  Has finished her steroid course.  She comes off of quarantine on 09/09/2020.  O2 requirements:  SpO2: 96 % O2 Flow Rate (L/min): 3 L/min   Prone/Incentive Spirometry: encouraged incentive spirometry use 3-4/hour.   Recent Labs  Lab 09/01/20 0718 09/01/20 1643 09/02/20 0313 09/03/20 0054 09/04/20 0514 09/05/20 0355 09/06/20 0320  WBC  --   --  20.6* 18.4* 12.4* 9.7 11.0*  HGB  --    < > 9.5* 8.9* 10.1* 10.1* 9.9*  HCT  --    < > 30.8* 28.7* 32.7* 33.2* 33.1*  PLT  --   --  280 255 291 280 248  CRP  --   --  12.4* 11.7* 6.2* 2.5* 1.4*  BNP  --   --   --   --   --   --  316.8*  DDIMER 2.53*  --  2.96* 7.49* 3.64* 1.89* 1.43*  PROCALCITON <0.10  --   --   --   --  1.19 0.67  AST  --   --  17 17 20  35 60*  ALT  --   --  14 13 20 22  43  ALKPHOS  --   --  40 46 65 91 114  BILITOT  --   --  0.6 0.7 0.5 0.4 0.1*  ALBUMIN  --   --  1.9* 1.8* 1.9* 1.8* 1.8*   < > = values in this interval not displayed.    Perforated duodenal ulcer s/p exploratory laparotomy on 11/3: Remains n.p.o.- NG tube/JP drain in  place-General surgery following and directing care.  Remains on PPI twice daily.  General surgery planning on starting TNA.  History of colon cancer- s/p Ileostomy since 2012  Elevated D-dimer: Secondary surgical issues rather than COVID-19-irrespective is on therapeutic Lovenox.  Paroxysmal atrial fibrillation with RVR EF on recent echo > 65%: Remains in atrial fibrillation-rate controlled with IV Cardizem drip, [place] vas 2 score better than 3, currently on heparin drip as she is n.p.o.  As needed digoxin for better rate control along with IV Lopressor as needed for rate control if needed.  AKI on CKD stage IIIb: AKI hemodynamically mediated-improved after hydration and close to baseline.  Anemia: Likely secondary to acute/critical illness-follow.  Hypernatremia.  D5W continue, rate adjusted on September 06, 2020.  HLD: Hold statin as patient n.p.o.  DM-2 (A1c 7.0 on 10/26) with uncontrolled hyperglycemia due to steroids: TNA adjusted, added Levemir, continue sliding scale and monitor.  Steroids now stopped.  Recent Labs    09/05/20 2052 09/06/20 0512 09/06/20 0734  GLUCAP 278* 212* 222*     Obesity: Estimated body mass index is 33.42 kg/m as calculated from the following:   Height as of this encounter: 5\' 8"  (1.727 m).   Weight as of this encounter: 99.7 kg.    GI prophylaxis: PPI  Condition - Stable  Family Communication  :  Daughter Candace [name] [phone]) updated over the phone 09/05/20  Code Status :  Full Code Diet :  Diet Order            Diet NPO time specified  Diet effective now                  Disposition Plan  :   The patient will require care spanning > 2 midnights and  should be moved to inpatient because: Inpatient level of care appropriate due to severity of illness  Dispo: The patient is from: Home              Anticipated d/c is to: Home              Anticipated d/c date is: > 3 days              Patient currently is not medically stable  to d/c.    Barriers to discharge: Hypoxia requiring O2 supplementation-perforated duodenal ulcer-s/p exploratory laparotomy on 11/3-remains n.p.o. with NG tube/JP drains in place.  Antimicorbials  :    Anti-infectives (From admission, onward)   Start     Dose/Rate Route Frequency Ordered Stop   09/02/20 1600  cefTRIAXone (ROCEPHIN) 1 g in sodium chloride 0.9 % 100 mL IVPB  Status:  Discontinued        1 g 200 mL/hr over 30 Minutes Intravenous Every 24 hours 09/01/20 1811 09/02/20 0838   09/01/20 1800  fluconazole (DIFLUCAN) IVPB 400 mg        400 mg 50 mL/hr over 240 Minutes Intravenous  Once 09/01/20 1749 09/02/20 0603   09/01/20 1530  piperacillin-tazobactam (ZOSYN) IVPB 3.375 g        3.375 g 12.5 mL/hr over 240 Minutes Intravenous Every 8 hours 09/01/20 1514 09/05/20 2111   09/01/20 1000  levofloxacin (LEVAQUIN) tablet 250 mg  Status:  Discontinued        250 mg Oral Daily 08/31/20 1508 08/31/20 1735   08/31/20 1730  cefTRIAXone (ROCEPHIN) 1 g in sodium chloride 0.9 % 100 mL IVPB  Status:  Discontinued        1 g 200 mL/hr over 30 Minutes Intravenous Every 24 hours 08/31/20 1726 09/01/20 1513   08/31/20 1730  azithromycin (ZITHROMAX) 500 mg in sodium chloride 0.9 % 250 mL IVPB  Status:  Discontinued        500 mg 250 mL/hr over 60 Minutes Intravenous Every 24 hours 08/31/20 1726 09/02/20 0838      Inpatient Medications  Scheduled Meds: . Chlorhexidine Gluconate Cloth  6 each Topical Daily  . digoxin  0.25 mg Intravenous Once  . enoxaparin (LOVENOX) injection  100 mg Subcutaneous Q12H  . insulin aspart  0-20 Units Subcutaneous Q4H  . insulin detemir  20 Units Subcutaneous BID  . pantoprazole (PROTONIX) IV  40 mg Intravenous Q12H   Continuous Infusions: . dextrose 100 mL/hr at 09/06/20 0826  . diltiazem (CARDIZEM) infusion 7.5 mg/hr (09/06/20 0500)  . TPN ADULT (ION) 40 mL/hr at 09/05/20 1750  . TPN ADULT (ION)     PRN Meds:.albuterol, metoprolol tartrate, morphine  injection, phenol, sodium chloride   Time Spent in minutes  25    See all Orders from today for further details   Lala Lund M.D on 09/06/2020 at 9:02 AM  To page go to [email or web address] - use universal password  [facility] -  Office  [phone]    Objective:   Vitals:   09/06/20 0300 09/06/20 0400 09/06/20 0500 09/06/20 0723  BP: 106/75 109/64 114/65 115/64  Pulse: (!) 105 100 (!) 106 (!) 112  Resp: 18 20 (!) 23 (!) 24  Temp:  97.6 F (36.4 C)  98 F (36.7 C)  TempSrc:  Axillary  Oral  SpO2: 99% 100% 99% 96%  Weight:      Height:        Wt Readings from Last 3 Encounters:  08/31/20 99.7 kg  08/23/20 99.8 kg     Intake/Output Summary (Last 24 hours) at 09/06/2020 0902 Last data filed at 09/06/2020 0720 Gross per 24 hour  Intake 1697.34 ml  Output 4300 ml  Net -2602.66 ml     Physical Exam  Awake Alert, No new F.N deficits, NG tube in place, right arm PICC line, ostomy bag in place, abdominal incision site stable with drain in place, [place].AT,PERRAL Supple Neck,No JVD, No cervical lymphadenopathy appriciated.  Symmetrical Chest wall movement, Good air movement bilaterally, CTAB RRR,No Gallops, Rubs or new Murmurs, No Parasternal Heave +ve B.Sounds, Abd Soft, No tenderness,   No Cyanosis, Clubbing or edema, No new Rash or bruise    Data Review:    CBC Recent Labs  Lab 08/31/20 1315 09/01/20 0043 09/02/20 0313 09/03/20 0054 09/04/20 0514 09/05/20 0355 09/06/20 0320  WBC 14.5*   < > 20.6* 18.4* 12.4* 9.7 11.0*  HGB 13.4   < > 9.5* 8.9* 10.1* 10.1* 9.9*  HCT 42.4   < > 30.8* 28.7* 32.7* 33.2* 33.1*  PLT 365   < > 280 255 291 280 248  MCV 73.2*   < > 74.0* 74.5* 75.2* 75.8* 75.7*  MCH 23.1*   < > 22.8* 23.1* 23.2* 23.1* 22.7*  MCHC 31.6   < > 30.8 31.0 30.9 30.4 29.9*  RDW 15.1   < > 15.3 15.5 15.7* 15.9* 15.6*  LYMPHSABS 0.4*  --   --   --  0.2*  --  0.5*  MONOABS 1.2*  --   --   --  0.4  --  0.7  EOSABS 0.0  --   --   --  0.0  --  0.0   BASOSABS 0.0  --   --   --  0.0  --  0.0   < > = values in this interval not displayed.    Chemistries  Recent Labs  Lab 08/31/20 1315 09/01/20 0043 09/02/20 0313 09/03/20 0054 09/04/20 0514 09/05/20 0355 09/06/20 0320  NA 136   < > 144 147* 150* 153* 150*  K 4.9   < > 4.2 4.1 3.9 4.0 4.7  CL 102   < > 114* 116* 117* 119* 118*  CO2 19*   < > 18* 22 26 27 26   GLUCOSE 316*   < > 242* 170* 402* 261* 255*  BUN 60*   < > 42* 43* 40* 37* 32*  CREATININE 1.69*   < > 1.53* 1.51* 1.35* 1.02* 0.86  CALCIUM 10.1   < > 8.8* 9.5 9.7 9.4 9.4  MG 1.8  --   --   --  1.9 1.8 2.0  AST 29  --  17 17 20  35 60*  ALT 21  --  14 13 20 22  43  ALKPHOS 57  --  40 46 65 91 114  BILITOT 1.2  --  0.6 0.7 0.5 0.4 0.1*   < > = values in this interval not displayed.   ------------------------------------------------------------------------------------------------------------------ Recent Labs    09/04/20 0514 09/06/20 0320  TRIG 164* 188*    Lab Results  Component Value Date   HGBA1C 7.0 (H) 08/24/2020   ------------------------------------------------------------------------------------------------------------------ No results for input(s): TSH, T4TOTAL, T3FREE, THYROIDAB in the last 72 hours.  Invalid input(s): FREET3 ------------------------------------------------------------------------------------------------------------------ No results for input(s): VITAMINB12, FOLATE, FERRITIN, TIBC, IRON, RETICCTPCT in the last 72 hours.  Coagulation profile No results for input(s): INR, PROTIME in the last 168 hours.  Recent Labs    09/05/20 0355 09/06/20 0320  DDIMER  1.89* 1.43*    Cardiac Enzymes No results for input(s): CKMB, TROPONINI, MYOGLOBIN in the last 168 hours.  Invalid input(s): CK ------------------------------------------------------------------------------------------------------------------    Component Value Date/Time   BNP 316.8 (H) 09/06/2020 0320    Micro  Results No results found for this or any previous visit (from the past 240 hour(s)).  Radiology Reports CT ABDOMEN PELVIS WO CONTRAST  Addendum Date: 09/01/2020   ADDENDUM REPORT: 09/01/2020 14:53 ADDENDUM: Critical Value/emergent results were called by telephone at the time of interpretation on 09/01/2020 at 1447 hours to Dr. Oren Binet , who verbally acknowledged these results. Electronically Signed   By: Genevie Ann M.D.   On: 09/01/2020 14:53   Result Date: 09/01/2020 CLINICAL DATA:  69 year old female with abdominal pain. COVID-19. History of colon cancer status post resection. EXAM: CT ABDOMEN AND PELVIS WITHOUT CONTRAST TECHNIQUE: Multidetector CT imaging of the abdomen and pelvis was performed following the standard protocol without IV contrast. COMPARISON:  CTA chest yesterday.  CT Abdomen and Pelvis 06/07/2014. FINDINGS: Lower chest: Extensive lower lung pneumonia. No pericardial or pleural effusion. Hepatobiliary: Pneumoperitoneum and intermediate density free fluid adjacent to the liver. Vicarious contrast excretion to the gallbladder. Grossly normal liver parenchyma. Pancreas: Negative noncontrast pancreas. Spleen: Negative. Adrenals/Urinary Tract: Stable low-density bilateral adrenal adenomas since 2015. Chronic renal parapelvic cysts redemonstrated. No definite hydronephrosis. No acute perinephric stranding. Both ureters are decompressed. Excreted IV contrast in the urinary bladder. Stomach/Bowel: Left lower quadrant ostomy seen in 2015 has been taken down, along with completion total colectomy since that time. 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 There is abnormal free air and intermediate density free fluid in the upper abdomen. Free air continues in the ventral small bowel mesentery toward the pelvis. And free air tracks along the right ileostomy loop. Oral contrast was administered, and has not leaked from the stomach, duodenum, or proximal jejunum. No abnormally dilated loops. And  the specific site of bowel perforation is unclear. There are several small bowel loops which might be adhered to the ventral abdominal wall abutting the greater curve of the stomach. Vascular/Lymphatic: Vascular patency is not evaluated in the absence of IV contrast. Mild Calcified aortic atherosclerosis. Normal caliber abdominal aorta. No lymphadenopathy. Reproductive: Retroverted uterus suspected, with surgically absent rectum. Other: Moderate pelvic free fluid with simple fluid density, similar to the 2015 CT. Distal colon appear surgically absent. Musculoskeletal: Advanced disc and endplate degeneration throughout the visible spine. No acute or suspicious osseous lesion identified. IMPRESSION: 1. Positive for bowel perforation: Pneumoperitoneum and intermediate density free fluid in the abdomen. Prior total colectomy. The specific site of perforation is unclear-oral contrast present to the proximal jejunum has not obviously leaked. Note that there may be small bowel loops adherent to the ventral abdominal wall along the greater curve of the stomach. 2. Extensive bilateral lower lung pneumonia. No pleural effusion. 3. Other abdominal and pelvic viscera are stable since 2015, including bilateral adrenal adenomas. Chronic renal parapelvic cysts. 4. Aortic Atherosclerosis (ICD10-I70.0). Electronically Signed: By: Genevie Ann M.D. On: 09/01/2020 14:40   DG Abd 1 View  Result Date: 09/05/2020 CLINICAL DATA:  Nausea EXAM: ABDOMEN - 1 VIEW COMPARISON:  09/04/2020 abdominal radiograph FINDINGS: Enteric tube terminates in the body of the stomach. No dilated small bowel loops. Minimal colonic gas. No evidence of pneumatosis or pneumoperitoneum. Degenerative changes in the lumbar spine. No radiopaque nephrolithiasis. IMPRESSION: Enteric tube terminates in the body of the stomach. Nonobstructive bowel gas pattern. Electronically Signed   By: Ilona Sorrel  M.D.   On: 09/05/2020 08:33   CT Angio Chest PE W and/or Wo  Contrast  Result Date: 08/31/2020 CLINICAL DATA:  Hypoxia, positive COVID EXAM: CT ANGIOGRAPHY CHEST WITH CONTRAST TECHNIQUE: Multidetector CT imaging of the chest was performed using the standard protocol during bolus administration of intravenous contrast. Multiplanar CT image reconstructions and MIPs were obtained to evaluate the vascular anatomy. CONTRAST:  22mL OMNIPAQUE IOHEXOL 350 MG/ML SOLN COMPARISON:  None. FINDINGS: Cardiovascular: Satisfactory opacification of the pulmonary arteries to the segmental level. No evidence of pulmonary embolism. Normal heart size. No pericardial effusion. Mediastinum/Nodes: No enlarged lymph nodes. Lungs/Pleura: Bilateral areas of consolidation and ground-glass density with a lower lobe predominance. No pleural effusion. No pneumothorax. Upper Abdomen: Partially imaged left adrenal nodule was present on 2015 abdominal CT. Right adrenal nodule was also present in 2015. Musculoskeletal: Degenerative changes of the included spine. Review of the MIP images confirms the above findings. IMPRESSION: No evidence of acute pulmonary embolism. COVID-19 pneumonia. Electronically Signed   By: Macy Mis M.D.   On: 08/31/2020 16:19   US RENAL  Result Date: 08/25/2020 CLINICAL DATA:  Acute kidney injury. EXAM: RENAL / URINARY TRACT ULTRASOUND COMPLETE COMPARISON:  Abdominal CT 06/07/2014 FINDINGS: Right Kidney: Renal measurements: 10.7 x 5.2 x 5.0 cm = volume: 146 mL. Cystic changes at the renal hila likely parapelvic cysts as seen on prior CT. Normal renal parenchymal echogenicity. No shadowing calculus. No solid renal mass. Left Kidney: Renal measurements: 10.7 x 6.2 x 5.7 cm = volume: 197 mL. Cystic changes at the renal hila likely parapelvic cyst is seen on prior CT. Normal renal parenchymal echogenicity. No shadowing calculus. No solid renal mass. Bladder: Appears normal for degree of bladder distention. Both ureteral jets are seen. Other: None. IMPRESSION: Cystic changes at  the renal hila likely parapelvic cysts as seen on prior CT. Mild bilateral hydronephrosis is felt less likely as both ureteral jets are seen in the bladder. Electronically Signed   By: Keith Rake M.D.   On: 08/25/2020 01:55   DG Chest Port 1 View  Result Date: 09/06/2020 CLINICAL DATA:  Shortness of breath and cough. History of COVID-19 positive status EXAM: PORTABLE CHEST 1 VIEW COMPARISON:  September 05, 2020 FINDINGS: Central catheter tip is in the superior vena cava. Nasogastric tube tip and side port are below the diaphragm. No pneumothorax. Airspace opacity is again noted in the mid and lower lung regions, similar to 1 day prior. Heart is upper normal in size with pulmonary vascularity normal. No adenopathy. There is aortic atherosclerosis. There is degenerative change in the left shoulder region. IMPRESSION: Tube and catheter positions as described without pneumothorax. Airspace opacity consistent with multifocal pneumonia bilaterally, stable. No new opacity evident. Stable cardiac silhouette. Aortic Atherosclerosis (ICD10-I70.0). Electronically Signed   By: Lowella Grip III M.D.   On: 09/06/2020 08:03   DG Chest Port 1 View  Result Date: 09/05/2020 CLINICAL DATA:  Dyspnea, atrial fibrillation, COVID positive EXAM: PORTABLE CHEST 1 VIEW COMPARISON:  09/03/2020 chest radiograph. FINDINGS: Enteric tube enters stomach with the tip not seen on this image. Right PICC terminates over the lower third of the SVC. Stable cardiomediastinal silhouette with normal heart size. No pneumothorax. No pleural effusion. Extensive patchy opacities throughout the mid to lower lungs bilaterally, not substantially changed. IMPRESSION: Extensive patchy opacities throughout the mid to lower lungs bilaterally, not substantially changed, compatible with COVID-19 pneumonia. Electronically Signed   By: Ilona Sorrel M.D.   On: 09/05/2020 08:34   DG  CHEST PORT 1 VIEW  Result Date: 09/03/2020 CLINICAL DATA:  PICC line  placement.  COVID 19 positive. EXAM: PORTABLE CHEST 1 VIEW COMPARISON:  Chest x-ray dated August 31, 2020. FINDINGS: New right upper extremity PICC line with the tip in the distal SVC. Enteric tube tip at the gastroesophageal junction. Stable cardiomediastinal silhouette. Bilateral mid and lower lung patchy opacities are not significantly changed. Postsurgical changes in the right upper lobe again noted. No pleural effusion or pneumothorax. No acute osseous abnormality. IMPRESSION: 1. New right upper extremity PICC line with tip in the distal SVC. 2. Enteric tube tip at the gastroesophageal junction. Recommend advancement. 3. Unchanged multifocal pneumonia. Electronically Signed   By: Titus Dubin M.D.   On: 09/03/2020 13:00   DG Chest Portable 1 View  Result Date: 08/31/2020 CLINICAL DATA:  Dyspnea at home health check up this morning, had COVID-19 diagnosis 10 days prior, recent discharge, new hypoxia EXAM: PORTABLE CHEST 1 VIEW COMPARISON:  Portable exam 1237 hours compared to 08/27/2020 FINDINGS: Normal heart size, mediastinal contours, and pulmonary vascularity. Atherosclerotic calcification aorta. Persistent infiltrates identified in the mid to lower lungs bilaterally consistent with multifocal pneumonia and COVID-19. Appearance unchanged from previous exam. No pleural effusion or pneumothorax. IMPRESSION: Persistent BILATERAL pulmonary infiltrates consistent with multifocal pneumonia and COVID-19. Electronically Signed   By: Lavonia Dana M.D.   On: 08/31/2020 12:45   DG Chest Port 1 View  Result Date: 08/27/2020 CLINICAL DATA:  Shortness of breath EXAM: PORTABLE CHEST 1 VIEW COMPARISON:  08/25/2020 FINDINGS: No significant interval change in AP portable examination with heterogeneous bibasilar airspace opacity and possible small layering pleural effusions. No new airspace opacity. Cardiomegaly. IMPRESSION: No significant interval change in AP portable examination with heterogeneous bibasilar  airspace opacity and possible small layering pleural effusions, consistent with multifocal infection. No new airspace opacity. Electronically Signed   By: Eddie Candle M.D.   On: 08/27/2020 11:04   DG Chest Port 1 View  Result Date: 08/25/2020 CLINICAL DATA:  Shortness of breath.  COVID-19 positive. EXAM: PORTABLE CHEST 1 VIEW COMPARISON:  August 23, 2020. FINDINGS: Stable cardiomediastinal silhouette. No pneumothorax is noted. Stable bilateral lung opacities are noted concerning for multifocal pneumonia. Bony thorax is unremarkable. IMPRESSION: Stable bilateral lung opacities are noted concerning for multifocal pneumonia. Electronically Signed   By: Marijo Conception M.D.   On: 08/25/2020 09:36   DG Chest Port 1 View  Result Date: 08/23/2020 CLINICAL DATA:  Shortness of breath EXAM: PORTABLE CHEST 1 VIEW COMPARISON:  None. FINDINGS: There is ill-defined airspace opacity in portions of each mid and lower lung region. Heart size and pulmonary vascularity are normal. No adenopathy. There is degenerative change in the left shoulder. IMPRESSION: Ill-defined airspace opacity in the mid and lower lung regions, most indicative of multifocal pneumonia. Question atypical organism pneumonia given this appearance. Check of COVID-19 status in this regard advised. Heart size within normal limits.  No adenopathy appreciable. Electronically Signed   By: Lowella Grip III M.D.   On: 08/23/2020 11:33   DG Abd Portable 1V  Result Date: 09/04/2020 CLINICAL DATA:  Nausea.  COVID positive. EXAM: PORTABLE ABDOMEN - 1 VIEW COMPARISON:  Abdominal radiograph earlier today.  CT 09/01/2020 FINDINGS: Enteric tube in place with tip and side-port below the diaphragm. Drainage catheter projects in the right abdomen. There is no bowel dilatation to suggest obstruction. No radiopaque calculi. Bilateral airspace disease in the included lung bases. IMPRESSION: 1. No evidence of bowel obstruction. 2. Enteric tube in  place with tip and  side-port below the diaphragm. Drainage catheter in the right abdomen. Electronically Signed   By: Keith Rake M.D.   On: 09/04/2020 16:59   DG Abd Portable 1V  Result Date: 09/04/2020 CLINICAL DATA:  NG tube placement EXAM: PORTABLE ABDOMEN - 1 VIEW COMPARISON:  03/03/2020 FINDINGS: NG tube has been advanced slightly with the tip in the proximal stomach. The side port is in the distal esophagus just above the GE junction. Bilateral lower lobe airspace opacities. IMPRESSION: NG tube tip in the proximal stomach with the side port in the distal esophagus. Electronically Signed   By: Rolm Baptise M.D.   On: 09/04/2020 02:08   DG Abd Portable 1V  Result Date: 09/03/2020 CLINICAL DATA:  Check gastric catheter placement EXAM: PORTABLE ABDOMEN - 1 VIEW COMPARISON:  None. FINDINGS: Gastric catheter is noted in the distal esophagus just short of the gastroesophageal junction. This should be advanced several cm deeper into the stomach. If it has been advanced since the prior chest x-ray from 1242 hours there may be looping in the pharynx. Clinical correlation is recommended. Patchy airspace disease is again identified in both lungs. IMPRESSION: Gastric catheter stable in appearance. This should be advanced several cm as described. Electronically Signed   By: Inez Catalina M.D.   On: 09/03/2020 23:38   ECHOCARDIOGRAM COMPLETE  Result Date: 09/01/2020    ECHOCARDIOGRAM REPORT   Patient Name:   ALLYAH HEATHER Date of Exam: 09/01/2020 Medical Rec #:  [id number]    Height:       68.0 in Accession #:    [id number]   Weight:       219.8 lb Date of Birth:  [birth year]    BSA:          2.127 m Patient Age:    34 years     BP:           103/57 mmHg Patient Gender: F            HR:           77 bpm. Exam Location:  Inpatient Procedure: 2D Echo Indications:    atrial fibrillation 427.31  History:        Patient has no prior history of Echocardiogram examinations.                 Risk Factors:Hypertension, Diabetes and  Dyslipidemia. Covid +.  Sonographer:    Jannett Celestine RDCS (AE) Referring Phys: [id number] AMY N COX  Sonographer Comments: Technically difficult study due to poor echo windows. Restricted mobility. IMPRESSIONS  1. Left ventricular ejection fraction, by estimation, is 70 to 75%. The left ventricle has hyperdynamic function. Left ventricular endocardial border not optimally defined to evaluate regional wall motion, but appears grossly normal. There is mild left ventricular hypertrophy.  2. RV is not well visualized due to image quality. Right ventricular systolic function is mildly reduced. The right ventricular size is mildly enlarged. TR signal inadequate for assessing RVSP.  3. The mitral valve is normal in structure. Trivial mitral valve regurgitation. No evidence of mitral stenosis.  4. The aortic valve is tricuspid. Aortic valve regurgitation is not visualized. No aortic stenosis is present.  5. The inferior vena cava is normal in size with greater than 50% respiratory variability, suggesting right atrial pressure of 3 mmHg. FINDINGS  Left Ventricle: Midcavitary gradient 17.5 mmHg, Vmax 2 m/s. Left ventricular ejection fraction, by estimation, is 70 to 75%. The left ventricle has hyperdynamic function. Left  ventricular endocardial border not optimally defined to evaluate regional wall motion. The left ventricular internal cavity size was normal in size. There is mild left ventricular hypertrophy. Left ventricular diastolic parameters are indeterminate. Right Ventricle: RV is not well visualized. The right ventricular size is mildly enlarged. No increase in right ventricular wall thickness. Right ventricular systolic function is mildly reduced. Tricuspid regurgitation signal is inadequate for assessing PA pressure. Left Atrium: Left atrial size was normal in size. Right Atrium: Right atrial size was normal in size. Pericardium: There is no evidence of pericardial effusion. Mitral Valve: The mitral valve is normal  in structure. Trivial mitral valve regurgitation. No evidence of mitral valve stenosis. Tricuspid Valve: The tricuspid valve is normal in structure. Tricuspid valve regurgitation is not demonstrated. No evidence of tricuspid stenosis. Aortic Valve: The aortic valve is tricuspid. Aortic valve regurgitation is not visualized. No aortic stenosis is present. Pulmonic Valve: The pulmonic valve was grossly normal. Pulmonic valve regurgitation is trivial. No evidence of pulmonic stenosis. Aorta: The aortic root is normal in size and structure. Venous: The inferior vena cava is normal in size with greater than 50% respiratory variability, suggesting right atrial pressure of 3 mmHg. IAS/Shunts: No atrial level shunt detected by color flow Doppler.  LEFT VENTRICLE PLAX 2D LVIDd:         3.45 cm LVIDs:         2.30 cm LV PW:         1.20 cm LV IVS:        1.15 cm LVOT diam:     2.20 cm LV SV:         70 LV SV Index:   33 LVOT Area:     3.80 cm  LEFT ATRIUM           Index LA diam:      3.20 cm 1.50 cm/m LA Vol (A2C): 32.9 ml 15.46 ml/m  AORTIC VALVE LVOT Vmax:   101.00 cm/s LVOT Vmean:  85.000 cm/s LVOT VTI:    0.185 m  AORTA Ao Root diam: 2.90 cm MITRAL VALVE MV Area (PHT): 2.95 cm    SHUNTS MV Decel Time: 257 msec    Systemic VTI:  0.18 m MV E velocity: 78.00 cm/s  Systemic Diam: 2.20 cm Cherlynn Kaiser MD Electronically signed by Cherlynn Kaiser MD Signature Date/Time: 09/01/2020/11:02:05 AM    Final    VAS [place] LOWER EXTREMITY VENOUS (DVT)  Result Date: 08/24/2020  Lower Venous DVTStudy Indications: Elevated d-dimer, covid.  Comparison Study: No prior studies. Performing Technologist: Darlin Coco  Examination Guidelines: A complete evaluation includes B-mode imaging, spectral Doppler, color Doppler, and power Doppler as needed of all accessible portions of each vessel. Bilateral testing is considered an integral part of a complete examination. Limited examinations for reoccurring indications may be performed as  noted. The reflux portion of the exam is performed with the patient in reverse Trendelenburg.  +---------+---------------+---------+-----------+----------+--------------+ RIGHT    CompressibilityPhasicitySpontaneityPropertiesThrombus Aging +---------+---------------+---------+-----------+----------+--------------+ CFV      Full           Yes      Yes                                 +---------+---------------+---------+-----------+----------+--------------+ SFJ      Full                                                        +---------+---------------+---------+-----------+----------+--------------+  FV Prox  Full                                                        +---------+---------------+---------+-----------+----------+--------------+ FV Mid   Full                                                        +---------+---------------+---------+-----------+----------+--------------+ FV DistalFull                                                        +---------+---------------+---------+-----------+----------+--------------+ PFV      Full                                                        +---------+---------------+---------+-----------+----------+--------------+ POP      Full           Yes      Yes                                 +---------+---------------+---------+-----------+----------+--------------+ PTV      Full                                                        +---------+---------------+---------+-----------+----------+--------------+ PERO     Full                                                        +---------+---------------+---------+-----------+----------+--------------+   +---------+---------------+---------+-----------+----------+--------------+ LEFT     CompressibilityPhasicitySpontaneityPropertiesThrombus Aging +---------+---------------+---------+-----------+----------+--------------+ CFV      Full            Yes      Yes                                 +---------+---------------+---------+-----------+----------+--------------+ SFJ      Full                                                        +---------+---------------+---------+-----------+----------+--------------+ FV Prox  Full                                                        +---------+---------------+---------+-----------+----------+--------------+  FV Mid   Full                                                        +---------+---------------+---------+-----------+----------+--------------+ FV DistalFull                                                        +---------+---------------+---------+-----------+----------+--------------+ PFV      Full                                                        +---------+---------------+---------+-----------+----------+--------------+ POP      Full           Yes      Yes                                 +---------+---------------+---------+-----------+----------+--------------+ PTV      Full                                                        +---------+---------------+---------+-----------+----------+--------------+ PERO     Full                                                        +---------+---------------+---------+-----------+----------+--------------+     Summary: RIGHT: - There is no evidence of deep vein thrombosis in the lower extremity.  - No cystic structure found in the popliteal fossa.  LEFT: - There is no evidence of deep vein thrombosis in the lower extremity.  - No cystic structure found in the popliteal fossa.  *See table(s) above for measurements and observations. Electronically signed by Deitra Mayo MD on 08/24/2020 at 5:36:23 PM.    Final    [place] EKG SITE RITE  Result Date: 09/03/2020 If Site Rite image not attached, placement could not be confirmed due to current cardiac rhythm.  [place] EKG SITE RITE  Result Date:  09/03/2020 If Site Rite image not attached, placement could not be confirmed due to current cardiac rhythm.

## 2020-09-06 NOTE — Plan of Care (Signed)
  Problem: Education: Goal: Utilization of techniques to improve thought processes will improve Outcome: Progressing Goal: Knowledge of the prescribed therapeutic regimen will improve Outcome: Progressing   Problem: Activity: Goal: Interest or engagement in leisure activities will improve Outcome: Progressing Goal: Imbalance in normal sleep/wake cycle will improve Outcome: Progressing   Problem: Coping: Goal: Coping ability will improve Outcome: Progressing Goal: Will verbalize feelings Outcome: Progressing   Problem: Health Behavior/Discharge Planning: Goal: Ability to make decisions will improve Outcome: Progressing Goal: Compliance with therapeutic regimen will improve Outcome: Progressing   Problem: Role Relationship: Goal: Will demonstrate positive changes in social behaviors and relationships Outcome: Progressing

## 2020-09-06 NOTE — Consult Note (Signed)
WOC Nurse Consult Note: Reason for Consult: Routine NPWT dressing change to midline incision. NPWT device was not functioning. Serosanguinous pooling of drainage at incision and escaping onto abdomen and to patient's left flank. Bedside RN notified.  NT assisted with bathing patient after dressing change.  Dr. Candiss Norse in to see patient during my visit.  Wound type: Surgical Pressure Injury POA: N/A Measurement: 17cmcm x 2cm x 2cm Wound bed: beefy red, bleeding Drainage (amount, consistency, odor) serosanguinous in a moderate amount Periwound: maceration to 2cm in the periwound area and in the umbilicus at 9 o'clock Dressing procedure/placement/frequency: Cannister changed.  NPWT power reestablished. Patient premedicated prior to dressing change. Old dressing with 1 piece of black foam removed. Wound cleansed with NS, and pressure held with gauze over two areas of minor frank bleeding. Once homeostasis achieved, strips of NPWT drape are applied to the periwound. A piece of skin barrier ring is used to cover the umbilicus. One piece of black foam is used to fill defect and is covered with drape.  The dressing is attached to 170mmHg continuous negative pressure and an immediate seal is achieved. Ostomy pouch is then changed (see below). The Bedside Tech is preparing to bathe patient, changing gown and mesh briefs due to large amount of serosanguinous drainage.   [place] Nurse ostomy consult note Stoma type/location: RMQ ileostomy. Stomal assessment/size: Sllightly larger than 1-inch, pink, moist with lumen at center Peristomal assessment: intact Treatment options for stomal/peristomal skin: skin barrier ring. Ordered next size smaller pouching system. Output: brown/green liquid effluent  Ostomy pouching: 2pc. Pouching system with skin barrier ring.  2 and 3/4 inch pouching system applied as that is what was in patient room.  I have ordered 2 and 1/4 inch supplies for future pouch changes.  Education  provided: None Enrolled patient in [place] program: No. Patient with long-standing ostomy and is established with a supplier.   Next dressing change is due on Wednesday, 09/08/20.   [place] nursing team will follow, and will remain available to this patient, the nursing, surgical and medical teams. . Thanks, Maudie Flakes, MSN, RN, [place], Arther Abbott  Pager# [phone]

## 2020-09-06 NOTE — Progress Notes (Signed)
PHARMACY - TOTAL PARENTERAL NUTRITION CONSULT NOTE  Indication:  Intolerance to EN  Patient Measurements: Height: 5\' 8"  (172.7 cm) Weight: 99.7 kg (219 lb 12.8 oz) IBW/kg (Calculated) : 63.9 TPN AdjBW (KG): 72.9 Body mass index is 33.42 kg/m.  Assessment:  35 YOF recently admitted (10/25 > 10/31) with Covid-19 and discharged home on supplemental oxygen and on Eliquis for elevated d-dimer. She returned to the ED for elevated heart rate, found to be in Afib RVR.  She developed abdominal pain that progressively got worse and CT shows bowel perforation, pneumoperitoneum and intermediate density free fluid in the abdomen. Of note, patient has a prior h/o partial colectomy/colostomy for colon cancer, completion colectomy and ileostomy. S/pex-lap with LoA, modified Graham patch repair of perforated duodenal ulceron 09/01/20.  Pharmacy consulted to manage TPN due to baseline malnutrition and likely prolonged inability to tolerate PO feeds.   Glucose / Insulin: hx DM - CBGs elevated from a combination of steroid, TPN and D5W (CBGs 212-335).  Required 61 units rSSI + Levemir 20units BID (SM dose reduced 11/5, 11/6, and last dose of 10 mg x 1 on 11/7) Electrolytes: Na/CL down to 150/118 (no Na in TPN, D5W at 75/hr), K up to 4.7, Phos low normal, CoCa elevated at 11.16 (Ca x Phos = 27.9, goal < 55), others WNL Renal: SCr down 0.86, BUN 32 LFTs / TGs: LFTs WNL except AST, tbili WNL, TG elevated at 188 Prealbumin / albumin: prealbumin WNL at 26.3, albumin 1.8 Intake / Output; MIVF: UOP 1.3 ml/kg/hr; drain 50 ml, stool 34mL, net -5.7L GI Imaging: none since TPN Surgeries / Procedures: none since TPN  Central access: triple lumen PICC placed 09/03/20 TPN start date: 09/03/20  Nutritional Goals (per RD on 11/5): kCal: 2200-2450, Protein: 115-130gms , Fluid >/=2L/day  Current Nutrition:  TPN  Plan:  Continue TPN at 40 ml/hr while CBGs remain uncontrolled (goal rate 90 ml/hr) TPN provides 55g AA, 154g  CHO and 28g ILE for a total of 1019 kCal, meeting ~45% of needs Electrolytes in TPN: no Na, decrease K to 3mEq/L (let K come down and allow for Phos supplementation outside of TPN if needed, d/t NaPhos shortage), remove Ca for now, Mag 7 mEq/L, Phos incr to 20 mmol/L, max acetate Add standard MVI and trace elements to TPN  Continue resistant SSI Q4H + Levemir 20 units SQ BID.  Managing outside of TPN per MD's preference. D5W increased to 100 ml/hr per MD for 24hrs F/U AM labs, watch TG (hold off on reducing ILE in TPN b/c that would increase CHO), CBGs to advance TPN   [name] D. Mina Marble, PharmD, BCPS, [place] 09/06/2020, 8:21 AM

## 2020-09-06 NOTE — Progress Notes (Addendum)
Inpatient Diabetes Program Recommendations  AACE/ADA: New Consensus Statement on Inpatient Glycemic Control (2015)  Target Ranges:  Prepandial:   less than 140 mg/dL      Peak postprandial:   less than 180 mg/dL (1-2 hours)      Critically ill patients:  140 - 180 mg/dL   Results for Candace [name], Candace [name] (MRN [id number]) as of 09/06/2020 09:50  Ref. Range 09/05/2020 04:48 09/05/2020 07:33 09/05/2020 12:23 09/05/2020 16:52 09/05/2020 20:52  Glucose-Capillary Latest Ref Range: 70 - 99 mg/dL 217 (H) 215 (H) 306 (H) 335 (H) 278 (H)   Results for Candace [name], Candace [name] (MRN [id number]) as of 09/06/2020 09:50  Ref. Range 09/06/2020 05:12 09/06/2020 07:34  Glucose-Capillary Latest Ref Range: 70 - 99 mg/dL 212 (H) 222 (H)   Results for Candace [name], Candace [name] (MRN [id number]) as of 09/06/2020 09:50  Ref. Range 08/24/2020 13:43  Hemoglobin A1C Latest Ref Range: 4.8 - 5.6 % 7.0 (H)    Recently hospitalized for COVID-19 pneumonia-and discharged on 2-3 L of home O2--Presents with worsening shortness of breath-found to have A. fib with RVR, ?? worsening hypoxia requiring up to 6 L of oxygen ( down to 2 - 3 lits within 12 hrs) and subsequently admitted to the hospitalist service.  Post admission-patient developed severe upper abdominal pain-subsequent further imaging studies revealed perforated viscus-she underwent a laparotomy on 11/3 which showed a perforated duodenal ulcer  History: DM  Home DM Meds: Metformin 500 mg BID  Current Orders: Levemir 20 units BID      Novolog Resistant Correction Scale/ SSI (0-20 units) Q4 hours    Solumedrol stopped--last dose given 9:30am yesterday (11/07)  Getting TPN 40cc/hr--No Insulin in the TPN solution   MD- Note that Levemir increased to 20 units BID yesterday AM.  Currently No insulin in the TPN solution.  May consider increasing the Levemir further to 25 units BID    --Will follow patient during hospitalization--  Wyn Quaker RN, MSN, CDE Diabetes  Coordinator Inpatient Glycemic Control Team Team Pager: [phone] (8a-5p)

## 2020-09-07 ENCOUNTER — Inpatient Hospital Stay (HOSPITAL_COMMUNITY): Payer: Medicare Other

## 2020-09-07 ENCOUNTER — Encounter (HOSPITAL_COMMUNITY): Payer: Self-pay | Admitting: Internal Medicine

## 2020-09-07 DIAGNOSIS — R0902 Hypoxemia: Secondary | ICD-10-CM

## 2020-09-07 LAB — CBC
HCT: 30.4 % — ABNORMAL LOW (ref 36.0–46.0)
HCT: 22.9 % — ABNORMAL LOW (ref 36.0–46.0)
HCT: 28.3 % — ABNORMAL LOW (ref 36.0–46.0)
Hemoglobin: 9.2 g/dL — ABNORMAL LOW (ref 12.0–15.0)
Hemoglobin: 6.9 g/dL — CL (ref 12.0–15.0)
Hemoglobin: 8.8 g/dL — ABNORMAL LOW (ref 12.0–15.0)
MCH: 23.1 pg — ABNORMAL LOW (ref 26.0–34.0)
MCH: 23.2 pg — ABNORMAL LOW (ref 26.0–34.0)
MCH: 25.4 pg — ABNORMAL LOW (ref 26.0–34.0)
MCHC: 30.3 g/dL (ref 30.0–36.0)
MCHC: 30.1 g/dL (ref 30.0–36.0)
MCHC: 31.1 g/dL (ref 30.0–36.0)
MCV: 76.2 fL — ABNORMAL LOW (ref 80.0–100.0)
MCV: 76.8 fL — ABNORMAL LOW (ref 80.0–100.0)
MCV: 81.6 fL (ref 80.0–100.0)
Platelets: 233 10*3/uL (ref 150–400)
Platelets: 216 10*3/uL (ref 150–400)
Platelets: 201 10*3/uL (ref 150–400)
RBC: 3.99 MIL/uL (ref 3.87–5.11)
RBC: 2.98 MIL/uL — ABNORMAL LOW (ref 3.87–5.11)
RBC: 3.47 MIL/uL — ABNORMAL LOW (ref 3.87–5.11)
RDW: 15.5 % (ref 11.5–15.5)
RDW: 15.5 % (ref 11.5–15.5)
RDW: 16.8 % — ABNORMAL HIGH (ref 11.5–15.5)
WBC: 12.5 10*3/uL — ABNORMAL HIGH (ref 4.0–10.5)
WBC: 20.2 10*3/uL — ABNORMAL HIGH (ref 4.0–10.5)
WBC: 34.5 10*3/uL — ABNORMAL HIGH (ref 4.0–10.5)
nRBC: 0.4 % — ABNORMAL HIGH (ref 0.0–0.2)
nRBC: 1.3 % — ABNORMAL HIGH (ref 0.0–0.2)
nRBC: 3.5 % — ABNORMAL HIGH (ref 0.0–0.2)

## 2020-09-07 LAB — COMPREHENSIVE METABOLIC PANEL
ALT: 56 U/L — ABNORMAL HIGH (ref 0–44)
AST: 59 U/L — ABNORMAL HIGH (ref 15–41)
Albumin: 1.7 g/dL — ABNORMAL LOW (ref 3.5–5.0)
Alkaline Phosphatase: 85 U/L (ref 38–126)
Anion gap: 6 (ref 5–15)
BUN: 26 mg/dL — ABNORMAL HIGH (ref 8–23)
CO2: 25 mmol/L (ref 22–32)
Calcium: 9.7 mg/dL (ref 8.9–10.3)
Chloride: 115 mmol/L — ABNORMAL HIGH (ref 98–111)
Creatinine, Ser: 0.75 mg/dL (ref 0.44–1.00)
GFR, Estimated: >60 mL/min (ref >60)
Glucose, Bld: 263 mg/dL — ABNORMAL HIGH (ref 70–99)
Potassium: 4.3 mmol/L (ref 3.5–5.1)
Sodium: 146 mmol/L — ABNORMAL HIGH (ref 135–145)
Total Bilirubin: 0.6 mg/dL (ref 0.3–1.2)
Total Protein: 4.9 g/dL — ABNORMAL LOW (ref 6.5–8.1)

## 2020-09-07 LAB — POCT I-STAT 7, (LYTES, BLD GAS, ICA,H+H)
Acid-base deficit: 17 mmol/L — ABNORMAL HIGH (ref 0.0–2.0)
Bicarbonate: 10.9 mmol/L — ABNORMAL LOW (ref 20.0–28.0)
Calcium, Ion: 1.38 mmol/L (ref 1.15–1.40)
HCT: 26 % — ABNORMAL LOW (ref 36.0–46.0)
Hemoglobin: 8.8 g/dL — ABNORMAL LOW (ref 12.0–15.0)
O2 Saturation: 91 %
Patient temperature: 95.3
Potassium: 4.8 mmol/L (ref 3.5–5.1)
Sodium: 144 mmol/L (ref 135–145)
TCO2: 12 mmol/L — ABNORMAL LOW (ref 22–32)
pCO2 arterial: 30.3 mmHg — ABNORMAL LOW (ref 32.0–48.0)
pH, Arterial: 7.154 — CL (ref 7.350–7.450)
pO2, Arterial: 71 mmHg — ABNORMAL LOW (ref 83.0–108.0)

## 2020-09-07 LAB — GLUCOSE, CAPILLARY
Glucose-Capillary: 153 mg/dL — ABNORMAL HIGH (ref 70–99)
Glucose-Capillary: 205 mg/dL — ABNORMAL HIGH (ref 70–99)
Glucose-Capillary: 247 mg/dL — ABNORMAL HIGH (ref 70–99)
Glucose-Capillary: 250 mg/dL — ABNORMAL HIGH (ref 70–99)
Glucose-Capillary: 305 mg/dL — ABNORMAL HIGH (ref 70–99)
Glucose-Capillary: 316 mg/dL — ABNORMAL HIGH (ref 70–99)
Glucose-Capillary: 331 mg/dL — ABNORMAL HIGH (ref 70–99)
Glucose-Capillary: 340 mg/dL — ABNORMAL HIGH (ref 70–99)

## 2020-09-07 LAB — BASIC METABOLIC PANEL
Anion gap: 11 (ref 5–15)
BUN: 30 mg/dL — ABNORMAL HIGH (ref 8–23)
CO2: 19 mmol/L — ABNORMAL LOW (ref 22–32)
Calcium: 9.8 mg/dL (ref 8.9–10.3)
Chloride: 111 mmol/L (ref 98–111)
Creatinine, Ser: 0.93 mg/dL (ref 0.44–1.00)
GFR, Estimated: >60 mL/min (ref >60)
Glucose, Bld: 355 mg/dL — ABNORMAL HIGH (ref 70–99)
Potassium: 4.9 mmol/L (ref 3.5–5.1)
Sodium: 141 mmol/L (ref 135–145)

## 2020-09-07 LAB — PROCALCITONIN
Procalcitonin: 0.27 ng/mL
Procalcitonin: 0.61 ng/mL

## 2020-09-07 LAB — BRAIN NATRIURETIC PEPTIDE: B Natriuretic Peptide: 216.8 pg/mL — ABNORMAL HIGH (ref 0.0–100.0)

## 2020-09-07 LAB — PHOSPHORUS: Phosphorus: 2.5 mg/dL (ref 2.5–4.6)

## 2020-09-07 LAB — CORTISOL: Cortisol, Plasma: 47.6 ug/dL

## 2020-09-07 LAB — PREPARE RBC (CROSSMATCH)

## 2020-09-07 LAB — LACTIC ACID, PLASMA: Lactic Acid, Venous: 9.9 mmol/L (ref 0.5–1.9)

## 2020-09-07 LAB — MAGNESIUM: Magnesium: 1.6 mg/dL — ABNORMAL LOW (ref 1.7–2.4)

## 2020-09-07 MED ORDER — DIPHENHYDRAMINE HCL 50 MG/ML IJ SOLN
25.0000 mg | Freq: Four times a day (QID) | INTRAMUSCULAR | Status: DC | PRN
  Administered 2020-10-13 – 2020-11-02 (×2): 25 mg via INTRAVENOUS
  Filled 2020-09-07 (×2): qty 1

## 2020-09-07 MED ORDER — NOREPINEPHRINE 4 MG/250ML-% IV SOLN
INTRAVENOUS | Status: AC
  Filled 2020-09-07: qty 250

## 2020-09-07 MED ORDER — MORPHINE SULFATE (PF) 2 MG/ML IV SOLN
1.0000 mg | INTRAVENOUS | Status: DC | PRN
  Administered 2020-09-07: 1 mg via INTRAVENOUS
  Filled 2020-09-07: qty 1

## 2020-09-07 MED ORDER — OXYCODONE HCL 5 MG PO TABS: 5.0000 mg | ORAL_TABLET | ORAL | Status: DC | PRN

## 2020-09-07 MED ORDER — FENTANYL 2500MCG IN NS 250ML (10MCG/ML) PREMIX INFUSION
25.0000 ug/h | INTRAVENOUS | Status: DC
  Administered 2020-09-07: 25 ug/h via INTRAVENOUS
  Filled 2020-09-07: qty 250

## 2020-09-07 MED ORDER — DOCUSATE SODIUM 50 MG/5ML PO LIQD
100.0000 mg | Freq: Two times a day (BID) | ORAL | Status: DC
  Administered 2020-09-08 – 2020-09-17 (×19): 100 mg
  Filled 2020-09-07 (×18): qty 10

## 2020-09-07 MED ORDER — INSULIN DETEMIR 100 UNIT/ML ~~LOC~~ SOLN
25.0000 [IU] | Freq: Two times a day (BID) | SUBCUTANEOUS | Status: DC
  Administered 2020-09-08 – 2020-09-09 (×3): 25 [IU] via SUBCUTANEOUS
  Filled 2020-09-07 (×6): qty 0.25

## 2020-09-07 MED ORDER — SILVER NITRATE-POT NITRATE 75-25 % EX MISC
1.0000 | CUTANEOUS | Status: DC | PRN
  Filled 2020-09-07: qty 1

## 2020-09-07 MED ORDER — TRAVASOL 10 % IV SOLN
INTRAVENOUS | Status: AC
  Filled 2020-09-07: qty 1248

## 2020-09-07 MED ORDER — ALBUMIN HUMAN 25 % IV SOLN
12.5000 g | Freq: Once | INTRAVENOUS | Status: AC
  Administered 2020-09-07: 12.5 g via INTRAVENOUS
  Filled 2020-09-07: qty 50

## 2020-09-07 MED ORDER — FENTANYL CITRATE (PF) 100 MCG/2ML IJ SOLN
50.0000 ug | Freq: Once | INTRAMUSCULAR | Status: AC
  Administered 2020-09-07: 50 ug via INTRAVENOUS

## 2020-09-07 MED ORDER — FUROSEMIDE 10 MG/ML IJ SOLN
20.0000 mg | Freq: Once | INTRAMUSCULAR | Status: AC
  Administered 2020-09-07: 20 mg via INTRAVENOUS
  Filled 2020-09-07: qty 2

## 2020-09-07 MED ORDER — AMIODARONE LOAD VIA INFUSION: 150.0000 mg | Freq: Once | INTRAVENOUS | Status: DC

## 2020-09-07 MED ORDER — ACETAMINOPHEN 325 MG PO TABS: 650.0000 mg | ORAL_TABLET | Freq: Four times a day (QID) | ORAL | Status: DC | PRN

## 2020-09-07 MED ORDER — VASOPRESSIN 20 UNITS/100 ML INFUSION FOR SHOCK
0.0000 [IU]/min | INTRAVENOUS | Status: DC
  Administered 2020-09-08 – 2020-09-13 (×11): 0.03 [IU]/min via INTRAVENOUS
  Filled 2020-09-07 (×12): qty 100

## 2020-09-07 MED ORDER — POLYETHYLENE GLYCOL 3350 17 G PO PACK
17.0000 g | PACK | Freq: Every day | ORAL | Status: DC
  Administered 2020-09-08 – 2020-09-21 (×13): 17 g
  Filled 2020-09-07 (×12): qty 1

## 2020-09-07 MED ORDER — LACTATED RINGERS IV BOLUS
1000.0000 mL | Freq: Once | INTRAVENOUS | Status: AC
  Administered 2020-09-07: 1000 mL via INTRAVENOUS

## 2020-09-07 MED ORDER — HYDROCORTISONE NA SUCCINATE PF 100 MG IJ SOLR
50.0000 mg | Freq: Four times a day (QID) | INTRAMUSCULAR | Status: DC
  Administered 2020-09-08 – 2020-09-10 (×10): 50 mg via INTRAVENOUS
  Filled 2020-09-07 (×11): qty 2

## 2020-09-07 MED ORDER — NOREPINEPHRINE 16 MG/250ML-% IV SOLN
0.0000 ug/min | INTRAVENOUS | Status: DC
  Administered 2020-09-08 (×2): 40 ug/min via INTRAVENOUS
  Administered 2020-09-08: 28 ug/min via INTRAVENOUS
  Administered 2020-09-09: 13 ug/min via INTRAVENOUS
  Administered 2020-09-10: 14 ug/min via INTRAVENOUS
  Administered 2020-09-10: 18 ug/min via INTRAVENOUS
  Administered 2020-09-11: 8 ug/min via INTRAVENOUS
  Administered 2020-09-12: 5 ug/min via INTRAVENOUS
  Filled 2020-09-07 (×8): qty 250

## 2020-09-07 MED ORDER — FENTANYL BOLUS VIA INFUSION
25.0000 ug | INTRAVENOUS | Status: DC | PRN
  Filled 2020-09-07: qty 25

## 2020-09-07 MED ORDER — SODIUM CHLORIDE 0.9% IV SOLUTION: Freq: Once | INTRAVENOUS | Status: AC

## 2020-09-07 MED ORDER — MAGNESIUM SULFATE 2 GM/50ML IV SOLN
2.0000 g | Freq: Once | INTRAVENOUS | Status: AC
  Administered 2020-09-07: 2 g via INTRAVENOUS

## 2020-09-07 MED ORDER — MIDAZOLAM HCL 2 MG/2ML IJ SOLN
INTRAMUSCULAR | Status: AC
  Filled 2020-09-07: qty 2

## 2020-09-07 MED ORDER — ALTEPLASE 2 MG IJ SOLR
2.0000 mg | Freq: Once | INTRAMUSCULAR | Status: AC
  Administered 2020-09-07: 2 mg
  Filled 2020-09-07: qty 2

## 2020-09-07 MED ORDER — AMIODARONE IV BOLUS ONLY 150 MG/100ML
150.0000 mg | Freq: Once | INTRAVENOUS | Status: AC
  Administered 2020-09-07: 150 mg via INTRAVENOUS
  Filled 2020-09-07: qty 100

## 2020-09-07 MED ORDER — FENTANYL CITRATE (PF) 100 MCG/2ML IJ SOLN
25.0000 ug | Freq: Once | INTRAMUSCULAR | Status: AC
  Administered 2020-09-07: 25 ug via INTRAVENOUS

## 2020-09-07 MED ORDER — ENOXAPARIN SODIUM 100 MG/ML ~~LOC~~ SOLN: 100.0000 mg | Freq: Two times a day (BID) | SUBCUTANEOUS | Status: DC

## 2020-09-07 MED ORDER — NOREPINEPHRINE 4 MG/250ML-% IV SOLN
0.0000 ug/min | INTRAVENOUS | Status: DC
  Administered 2020-09-07: 40 ug/min via INTRAVENOUS
  Filled 2020-09-07: qty 250

## 2020-09-07 MED ORDER — DEXMEDETOMIDINE HCL IN NACL 400 MCG/100ML IV SOLN
0.4000 ug/kg/h | INTRAVENOUS | Status: DC
  Administered 2020-09-07: 0.6 ug/kg/h via INTRAVENOUS
  Filled 2020-09-07: qty 100

## 2020-09-07 MED ORDER — FENTANYL CITRATE (PF) 100 MCG/2ML IJ SOLN
INTRAMUSCULAR | Status: AC
  Filled 2020-09-07: qty 2

## 2020-09-07 NOTE — Consult Note (Signed)
WOC Nurse Consult Note: Reason for Consult: Midline wound NPWT device began leaking serosanguinous drainage during the night and Bedside RN was unable to reinforce dressing to obtain seal.  Wound type:Surgical Pressure Injury POA: N/A Measurement: per yesterday: 17cm x 2cm x 2cm Wound bed: per yesterday: beefy red, bleeding Drainage (amount, consistency, odor) serosanguinous per bedside RN Periwound:intact with mild maceration per yesterday. Dressing procedure/placement/frequency: Discussed with CCS PA B. Meuth and order received to discontinue NPWT at this time.  I have provided Nursing with guidance to remove the NPWT dressing, cleanse the wound with NS and place a saline moistened gauze dressing to the wound bed. Ostomy pouch will need to be changed simultaneously as the pouching system overlaps the NPWT dressing (ostomy supplies are at bedside).  Wound care orders are now saline dressings applied twice daily and PRN drainage strike-through onto exterior of dressing.  WOC Nursing will sign off. CCS may elect to resume NPWT at a later date. Please reconsult if needed.  [place] nursing team will not follow, but will remain available to this patient, the nursing and medical teams.  Please re-consult if needed. Thanks, Maudie Flakes, MSN, RN, [place], Arther Abbott  Pager# [phone]

## 2020-09-07 NOTE — Consult Note (Signed)
NAMEShanisha [name], MRN:  [id number], DOB:  1951/07/01, LOS: 6 ADMISSION DATE:  08/31/2020, CONSULTATION DATE:  09/07/2020 REFERRING MD:  Dr Candiss Norse, CHIEF COMPLAINT:  Acute resp failure  Brief History   69 year old female who was previously diagnosed with Covid 08/23/2020.  This admission patient was found to have a perforated duodenal underwent exploratory laparotomy with Phillip Heal patch placement.  History of present illness   69 year old black female that presented to the emergency room on 08/31/2020 for worsening of hypoxia and atrial fibrillation with RVR.  She was admitted to medical floor and placed on Cardizem infusion as well as Eliquis.  She then developed abdominal pain and was found to have a perforated duodenal ulcer for which she went to the operating room and had exploratory laparotomy, lysis of adhesions, Graham patch placement.  Currently postop day 6.  Patient today developed bleeding from the surgical site.  Reported to a saturated dressing.  Patient was noted to have a hemoglobin change from 9-6.9.  During the same period time she had waxing waning blood pressure.  Currently she denies any abdominal pain.  She states she is short of breath and that she is anxious.  She denies any chest pain/chest pressure/nausea/gas pain.  Past Medical History   Covid pneumonia Atrial fibrillation CKD stage III Diabetes mellitus Hypertension Colon cancer Hyperlipidemia  Significant Hospital Events   Atrial fibrillation with RVR Perforated duodenal ulcer  Consults:  Cardiology  Procedures:  Exploratory laparotomy, Graham patch, lysis of adhesion for duodenal ulceration postop day 6   Micro Data:    Antimicrobials:  none   Objective   Blood pressure (!) 80/48, pulse (!) 101, temperature (!) 97.5 F (36.4 C), temperature source Axillary, resp. rate (!) 48, height 5\' 8"  (1.727 m), weight 99.7 kg, SpO2 92 %.    Vent Mode: PCV FiO2 (%):  [35 %-100 %] 100 % Set Rate:  [15 bmp]  15 bmp PEEP:  [10 cmH20] 10 cmH20   Intake/Output Summary (Last 24 hours) at 09/07/2020 2007 Last data filed at 09/07/2020 2000 Gross per 24 hour  Intake 1561.42 ml  Output 1610 ml  Net -48.58 ml   Filed Weights   08/31/20 2115  Weight: 99.7 kg    Examination: General: No acute distress, tachypneic, awake and alert HENT: Atraumatic/normocephalic mucous membranes moist. Lungs: Minimal crackles at the bases but otherwise clear.  No rhonchi.  No wheezing. Cardiovascular: Regular rate no murmur rub or gallop appreciated Abdomen: Soft, nondistended, nontender, midline incision-skin is open with wet-to-dry dressing.  There is no active bleeding seen.  Tissue bed is pink.  Right upper quadrant JP intact with minimal bloody drainage seen.  Ostomy intact with dark bilious material-no blood Extremities: Distal pulse intact x4.  +1 edema.  No cyanosis. Neuro: Conscious alert and orient x3.  Motor is grossly intact x4 extremities.  No focal deficits. GU: pure wick   Assessment & Plan:  Acute respiratory failure Recent Covid pneumonia Mild pulmonary edema Acute blood loss anemia Hypotensive does not appear to be hemorrhagic shock   Plan: Patient was transferred to the intensive care unit for further work-up. Patient was placed on BiPAP on arrival to the intensive care unit She is very tachypneic.  This is beyond physical findings most consistent with post Covid response.  Very minimal rales at the bases.  Will start Precedex. Discussed with Dr. Grandville Silos from surgery service.  I let them know that there was of active bleeding earlier but currently there is no active  ongoing evidence of bleeding but we will keep them updated if that situation changes. Serial H&H Transfusing 2 units packed RBC with Lasix between the 2 units. Send pro-Cal level Cardizem drip was discontinued on arrival to the intensive care unit. Send random cortisol level now.  Acute change in condition: While dictating  this note the patient acutely became unresponsive with agonal respirations.  Patient required emergent intubation, triple-lumen catheter placement, and A-line placement. She is now on high-dose Levophed and vasopressin. We will send the patient for pan scan CT stat.   Best practice:  Diet: N.p.o.-TPN Pain/Anxiety/Delirium protocol (if indicated): Started on Precedex  VAP protocol (if indicated): DVT prophylaxis: SCDs GI prophylaxis: Protonix Glucose control: Insulin Mobility: Bedrest Code Status: Full Family Communication:  Disposition: Transferred to the intensive care unit  Labs   CBC: Recent Labs  Lab 09/04/20 0514 09/05/20 0355 09/06/20 0320 09/07/20 0118 09/07/20 1625  WBC 12.4* 9.7 11.0* 12.5* 20.2*  NEUTROABS 11.7*  --  9.6*  --   --   HGB 10.1* 10.1* 9.9* 9.2* 6.9*  HCT 32.7* 33.2* 33.1* 30.4* 22.9*  MCV 75.2* 75.8* 75.7* 76.2* 76.8*  PLT 291 280 248 233 449    Basic Metabolic Panel: Recent Labs  Lab 09/04/20 0514 09/05/20 0355 09/06/20 0320 09/07/20 0118 09/07/20 1625  NA 150* 153* 150* 146* 141  K 3.9 4.0 4.7 4.3 4.9  CL 117* 119* 118* 115* 111  CO2 26 27 26 25  19*  GLUCOSE 402* 261* 255* 263* 355*  BUN 40* 37* 32* 26* 30*  CREATININE 1.35* 1.02* 0.86 0.75 0.93  CALCIUM 9.7 9.4 9.4 9.7 9.8  MG 1.9 1.8 2.0 1.6*  --   PHOS 2.7 2.0* 2.5 2.5  --    GFR: Estimated Creatinine Clearance: 70.5 mL/min (by C-G formula based on SCr of 0.93 mg/dL). Recent Labs  Lab 09/01/20 0718 09/02/20 0313 09/05/20 0355 09/06/20 0320 09/07/20 0118 09/07/20 1625  PROCALCITON <0.10  --  1.19 0.67  --  0.27  WBC  --    < > 9.7 11.0* 12.5* 20.2*   < > = values in this interval not displayed.    Liver Function Tests: Recent Labs  Lab 09/03/20 0054 09/04/20 0514 09/05/20 0355 09/06/20 0320 09/07/20 0118  AST 17 20 35 60* 59*  ALT 13 20 22  43 56*  ALKPHOS 46 65 91 114 85  BILITOT 0.7 0.5 0.4 0.1* 0.6  PROT 4.8* 5.4* 5.0* 5.1* 4.9*  ALBUMIN 1.8* 1.9* 1.8* 1.8*  1.7*   No results for input(s): LIPASE, AMYLASE in the last 168 hours. No results for input(s): AMMONIA in the last 168 hours.  ABG    Component Value Date/Time   PHART 7.348 (L) 09/01/2020 1817   PCO2ART 34.7 09/01/2020 1817   PO2ART 53 (L) 09/01/2020 1817   HCO3 19.1 (L) 09/01/2020 1817   TCO2 20 (L) 09/01/2020 1817   ACIDBASEDEF 6.0 (H) 09/01/2020 1817   O2SAT 85.0 09/01/2020 1817     Coagulation Profile: No results for input(s): INR, PROTIME in the last 168 hours.  Cardiac Enzymes: No results for input(s): CKTOTAL, CKMB, CKMBINDEX, TROPONINI in the last 168 hours.  HbA1C: Hgb A1c MFr Bld  Date/Time Value Ref Range Status  08/24/2020 01:43 PM 7.0 (H) 4.8 - 5.6 % Final    Comment:    (NOTE)         Prediabetes: 5.7 - 6.4         Diabetes: >6.4  Glycemic control for adults with diabetes: <7.0     CBG: Recent Labs  Lab 09/07/20 0747 09/07/20 1159 09/07/20 1653 09/07/20 1807 09/07/20 1958  GLUCAP 250* 340* 305* 331* 316*    Review of Systems:   See HPI  Past Medical History  She,  has a past medical history of Colon cancer ([place]), Diabetes mellitus ([place]), Hyperlipemia, and Hypertension.   Surgical History    Past Surgical History:  Procedure Laterality Date  . LAPAROTOMY N/A 09/01/2020   Procedure: EXPLORATORY LAPAROTOMY; Repair of Perforated Duodenal Ulcer;  Surgeon: Jesusita Oka, MD;  Location: [facility];  Service: General;  Laterality: N/A;  . LYSIS OF ADHESION N/A 09/01/2020   Procedure: LYSIS OF ADHESION;  Surgeon: Jesusita Oka, MD;  Location: [facility];  Service: General;  Laterality: N/A;     Social History   reports that she has never smoked. She has never used smokeless tobacco. She reports that she does not drink alcohol and does not use drugs.   Family History   Her family history includes Alzheimer's disease in her father; Clotting disorder in her mother.   Allergies No Known Allergies   Home Medications  Prior to Admission  medications   Medication Sig Start Date End Date Taking? Authorizing Provider  acetaminophen (TYLENOL) 500 MG tablet Take 1,000 mg by mouth every 6 (six) hours as needed for mild pain.   Yes [provider]  albuterol (VENTOLIN HFA) 108 (90 Base) MCG/ACT inhaler Inhale 2 puffs into the lungs every 6 (six) hours as needed for wheezing or shortness of breath. 08/29/20  Yes Thurnell Lose, MD  amLODipine (NORVASC) 10 MG tablet Take 1 tablet (10 mg total) by mouth daily. 08/29/20 08/29/21 Yes Thurnell Lose, MD  apixaban (ELIQUIS) 2.5 MG TABS tablet Take 1 tablet (2.5 mg total) by mouth 2 (two) times daily. 08/29/20  Yes Thurnell Lose, MD  aspirin 81 MG EC tablet Take 81 mg by mouth daily. 06/20/18  Yes [provider]  levofloxacin (LEVAQUIN) 250 MG tablet Take 1 tablet (250 mg total) by mouth daily. 08/29/20  Yes Thurnell Lose, MD  metFORMIN (GLUCOPHAGE-XR) 500 MG 24 hr tablet Take 500 mg by mouth in the morning and at bedtime.  07/16/20  Yes [provider]  rosuvastatin (CRESTOR) 40 MG tablet Take 40 mg by mouth daily. 06/22/20  Yes [provider]  zinc gluconate 50 MG tablet Take 50 mg by mouth daily.   Yes [provider]     Critical care time: 11mins

## 2020-09-07 NOTE — Progress Notes (Signed)
PT Cancellation Note  Patient Details Name: Candace [name] MRN: [id number] DOB: 11-05-1950   Cancelled Treatment:    Reason Eval/Treat Not Completed: Medical issues which prohibited therapy  Has been having hypotension this pm with current BP 66/59   Arby Barrette, PT Pager [phone]  Rexanne Mano 09/07/2020, 3:11 PM

## 2020-09-07 NOTE — Progress Notes (Addendum)
PROGRESS NOTE                                                                                                                                                                                                             Patient Demographics:    Candace [name], is a 69 y.o. female, DOB - 05/02/1951, POE:[id number]  Outpatient Primary MD for the patient is [name], [name], MD   Admit date - 08/31/2020   LOS - 6  CC - fast Heart rate     Brief Narrative: Patient is a 69 y.o. female was recently hospitalized for COVID-19 pneumonia-and discharged on 2-3 L of home O2-presents with worsening shortness of breath-found to have A. fib with RVR, ?? worsening hypoxia requiring up to 6 L of oxygen ( down to 2 - 3 lits within 12 hrs) and subsequently admitted to the hospitalist service.  Post admission-patient developed severe upper abdominal pain-subsequent further imaging studies revealed perforated viscus-she underwent a laparotomy on 11/3 which showed a perforated duodenal ulcer.  See below for further details.  COVID-19 vaccinated status: Unvaccinated  Significant Events: 10/25-10/31>> hospitalization for COVID-19 pneumonia-discharged on 2-3 L of oxygen. 11/2>> Admit to [facility] for worsening hypoxemia requiring 6 L of oxygen, A. fib with RVR 11/3>> developed abdominal pain-CT abdomen positive for perforated viscus-underwent emergent laparotomy  Significant studies: 10/26>> bilateral lower extremity Doppler: No DVT. 10/27>> renal ultrasound: Cystic changes at the renal hila likely parapelvic cysts.  Hydronephrosis felt to be less likely. 11/2>>Chest x-ray: Persistent bilateral pulmonary infiltrates consistent with multifocal pneumonia 11/2>> CTA chest: No PE, areas of consolidation and groundglass density bilaterally. 11/3>> Echo: EF 70-75% 11/3>> CT abdomen/pelvis: Pneumoperitoneum/intermediate density free fluid in the abdomen-positive for  bowel perforation.  COVID-19 medications: Steroids: Resume 11/2>> Remdesivir: 10/25>> 10/29  Antibiotics: Rocephin: 11/2 x1 Zithromax: 11/12>> 11/3 Zosyn: 11/3>>  Microbiology data: 10/25 >>blood culture: No growth  Procedures: 11/12>> ex lap-modified Phillip Heal patch repair of perforated duodenal ulcer.  Consults: None  DVT prophylaxis: Place TED hose Start: 08/31/20 1711 Place TED hose Start: 08/31/20 1507 Started on therapeutic Lovenox on 11/4    Subjective:   Patient in bed, appears comfortable, denies any headache, no fever, no chest pain or pressure, no shortness of breath , no abdominal pain. No focal weakness.   Assessment  & Plan :   Addendum around 12:45  in the afternoon on 09/07/2028 patient suddenly more hypotensive and tachycardic, she also had some liquid diet for the first time today.  Stopped Cardizem drip switch to amiodarone, 1 L LR bolus, repeat chest x-ray and abdominal x-ray, repeat CBC and electrolytes, type screen, increase oxygen for now.  Cardiology also called for A. fib RVR.  Monitor closely.  Patient examined 1:30 PM.  Large amount of blood from the abdominal incision site where the wound VAC was, bleeding stabilized by general surgery who was also present.  Give unit of packed RBC now , skip anticoagulation for 24 hours.  Monitor H&H. Daughter updated, prognosis guarded.  6.55pm - 1st unit in , RR in 40s, likely due to low H&H, called PCCM >> ICU, Bipap for now.   Acute Hypoxic Resp Failure due to ongoing COVID-19 related pneumonitis: Stable on just 2 L of oxygen- was on steroids.  Doubt bacterial pneumonia-procalcitonin levels are normal-she is on Zosyn to cover her intra-abdominal issues.  Has finished her steroid course.  She comes off of quarantine on 09/09/2020.  O2 requirements:  SpO2: 98 % O2 Flow Rate (L/min): 3 L/min   Prone/Incentive Spirometry: encouraged incentive spirometry use 3-4/hour.   Recent Labs  Lab 09/01/20 0718  09/01/20 1643 09/02/20 0313 09/02/20 0313 09/03/20 0054 09/04/20 0514 09/05/20 0355 09/06/20 0320 09/07/20 0118  WBC  --   --  20.6*   < > 18.4* 12.4* 9.7 11.0* 12.5*  HGB  --    < > 9.5*   < > 8.9* 10.1* 10.1* 9.9* 9.2*  HCT  --    < > 30.8*   < > 28.7* 32.7* 33.2* 33.1* 30.4*  PLT  --   --  280   < > 255 291 280 248 233  CRP  --   --  12.4*  --  11.7* 6.2* 2.5* 1.4*  --   BNP  --   --   --   --   --   --   --  316.8* 216.8*  DDIMER 2.53*  --  2.96*  --  7.49* 3.64* 1.89* 1.43*  --   PROCALCITON <0.10  --   --   --   --   --  1.19 0.67  --   AST  --   --  17   < > 17 20 35 60* 59*  ALT  --   --  14   < > 13 20 22  43 56*  ALKPHOS  --   --  40   < > 46 65 91 114 85  BILITOT  --   --  0.6   < > 0.7 0.5 0.4 0.1* 0.6  ALBUMIN  --   --  1.9*   < > 1.8* 1.9* 1.8* 1.8* 1.7*   < > = values in this interval not displayed.    Perforated duodenal ulcer s/p exploratory laparotomy on 11/3: Remains n.p.o.- NG tube/JP drain in place-General surgery following and directing care.  Remains on PPI twice daily.  On TNA.  Paroxysmal atrial fibrillation with RVR EF on recent echo > 65%: Remains in atrial fibrillation-rate controlled with IV Cardizem & Lovenox, [place] vas 2 score better than 3,  as she is n.p.o.  As needed IV Lopressor as needed for rate control if needed.    History of colon cancer- s/p Ileostomy since 2012  Hypernatremia.  D5W continue, improving.  Elevated D-dimer: Secondary surgical issues rather than COVID-19-irrespective is on therapeutic Lovenox.  AKI on CKD  stage IIIb: AKI hemodynamically mediated-improved after hydration and close to baseline.  Anemia: Likely secondary to acute/critical illness-follow.  HLD: Hold statin as patient n.p.o.  DM-2 (A1c 7.0 on 10/26) with uncontrolled hyperglycemia due to steroids: TNA adjusted, adjusted Levemir, continue sliding scale and monitor.  Steroids now stopped.  Recent Labs    09/07/20 0001 09/07/20 0401 09/07/20 0747  GLUCAP  205* 247* 250*     Obesity: Estimated body mass index is 33.42 kg/m as calculated from the following:   Height as of this encounter: 5\' 8"  (1.727 m).   Weight as of this encounter: 99.7 kg.    GI prophylaxis: PPI  Condition - Stable  Family Communication  :  Daughter [name] [phone]) updated over the phone 09/05/20  Code Status :  Full Code Diet :  Diet Order            Diet clear liquid Room service appropriate? Yes; Fluid consistency: Thin  Diet effective now                  Disposition Plan  :   The patient will require care spanning > 2 midnights and should be moved to inpatient because: Inpatient level of care appropriate due to severity of illness  Dispo: The patient is from: Home              Anticipated d/c is to: Home              Anticipated d/c date is: > 3 days              Patient currently is not medically stable to d/c.    Barriers to discharge: Hypoxia requiring O2 supplementation-perforated duodenal ulcer-s/p exploratory laparotomy on 11/3-remains n.p.o. with NG tube/JP drains in place.  Antimicorbials  :    Anti-infectives (From admission, onward)   Start     Dose/Rate Route Frequency Ordered Stop   09/02/20 1600  cefTRIAXone (ROCEPHIN) 1 g in sodium chloride 0.9 % 100 mL IVPB  Status:  Discontinued        1 g 200 mL/hr over 30 Minutes Intravenous Every 24 hours 09/01/20 1811 09/02/20 0838   09/01/20 1800  fluconazole (DIFLUCAN) IVPB 400 mg        400 mg 50 mL/hr over 240 Minutes Intravenous  Once 09/01/20 1749 09/02/20 0603   09/01/20 1530  piperacillin-tazobactam (ZOSYN) IVPB 3.375 g        3.375 g 12.5 mL/hr over 240 Minutes Intravenous Every 8 hours 09/01/20 1514 09/05/20 2111   09/01/20 1000  levofloxacin (LEVAQUIN) tablet 250 mg  Status:  Discontinued        250 mg Oral Daily 08/31/20 1508 08/31/20 1735   08/31/20 1730  cefTRIAXone (ROCEPHIN) 1 g in sodium chloride 0.9 % 100 mL IVPB  Status:  Discontinued        1 g 200  mL/hr over 30 Minutes Intravenous Every 24 hours 08/31/20 1726 09/01/20 1513   08/31/20 1730  azithromycin (ZITHROMAX) 500 mg in sodium chloride 0.9 % 250 mL IVPB  Status:  Discontinued        500 mg 250 mL/hr over 60 Minutes Intravenous Every 24 hours 08/31/20 1726 09/02/20 0838      Inpatient Medications  Scheduled Meds: . Chlorhexidine Gluconate Cloth  6 each Topical Daily  . enoxaparin (LOVENOX) injection  100 mg Subcutaneous Q12H  . insulin aspart  0-20 Units Subcutaneous Q4H  . insulin detemir  25 Units Subcutaneous BID  . pantoprazole (  PROTONIX) IV  40 mg Intravenous Q12H   Continuous Infusions: . dextrose 100 mL/hr at 09/06/20 1700  . diltiazem (CARDIZEM) infusion 15 mg/hr (09/07/20 0356)  . TPN ADULT (ION) 40 mL/hr at 09/06/20 1724  . TPN ADULT (ION)     PRN Meds:.albuterol, metoprolol tartrate, morphine injection, phenol, sodium chloride   Time Spent in minutes  25    See all Orders from today for further details   Lala Lund M.D on 09/07/2020 at 10:13 AM  To page go to [email or web address] - use universal password  [facility] -  Office  [phone]    Objective:   Vitals:   09/06/20 1928 09/07/20 0000 09/07/20 0400 09/07/20 0825  BP: (!) 111/57 (!) 108/55 112/62 (!) 116/59  Pulse:  98 98 100  Resp: (!) 26 (!) 21 (!) 22 (!) 23  Temp: 98 F (36.7 C) 98.1 F (36.7 C) 97.7 F (36.5 C) 97.6 F (36.4 C)  TempSrc: Axillary Oral Oral Oral  SpO2: 95% 95% 99% 98%  Weight:      Height:        Wt Readings from Last 3 Encounters:  08/31/20 99.7 kg  08/23/20 99.8 kg     Intake/Output Summary (Last 24 hours) at 09/07/2020 1013 Last data filed at 09/07/2020 0555 Gross per 24 hour  Intake 731.67 ml  Output 1960 ml  Net -1228.33 ml     Physical Exam  Awake Alert, No new F.N deficits, NG tube in place, right arm PICC line, ostomy bag in place, abdominal incision site stable with drain in place, [place].AT,PERRAL Supple Neck,No JVD, No cervical  lymphadenopathy appriciated.  Symmetrical Chest wall movement, Good air movement bilaterally, CTAB RRR,No Gallops, Rubs or new Murmurs, No Parasternal Heave +ve B.Sounds, Abd Soft, No tenderness,   No Cyanosis, Clubbing or edema, No new Rash or bruise     Data Review:    CBC Recent Labs  Lab 08/31/20 1315 09/01/20 0043 09/03/20 0054 09/04/20 0514 09/05/20 0355 09/06/20 0320 09/07/20 0118  WBC 14.5*   < > 18.4* 12.4* 9.7 11.0* 12.5*  HGB 13.4   < > 8.9* 10.1* 10.1* 9.9* 9.2*  HCT 42.4   < > 28.7* 32.7* 33.2* 33.1* 30.4*  PLT 365   < > 255 291 280 248 233  MCV 73.2*   < > 74.5* 75.2* 75.8* 75.7* 76.2*  MCH 23.1*   < > 23.1* 23.2* 23.1* 22.7* 23.1*  MCHC 31.6   < > 31.0 30.9 30.4 29.9* 30.3  RDW 15.1   < > 15.5 15.7* 15.9* 15.6* 15.5  LYMPHSABS 0.4*  --   --  0.2*  --  0.5*  --   MONOABS 1.2*  --   --  0.4  --  0.7  --   EOSABS 0.0  --   --  0.0  --  0.0  --   BASOSABS 0.0  --   --  0.0  --  0.0  --    < > = values in this interval not displayed.    Chemistries  Recent Labs  Lab 08/31/20 1315 09/01/20 0043 09/03/20 0054 09/04/20 0514 09/05/20 0355 09/06/20 0320 09/07/20 0118  NA 136   < > 147* 150* 153* 150* 146*  K 4.9   < > 4.1 3.9 4.0 4.7 4.3  CL 102   < > 116* 117* 119* 118* 115*  CO2 19*   < > 22 26 27 26 25   GLUCOSE 316*   < > 170* 402* 261*  255* 263*  BUN 60*   < > 43* 40* 37* 32* 26*  CREATININE 1.69*   < > 1.51* 1.35* 1.02* 0.86 0.75  CALCIUM 10.1   < > 9.5 9.7 9.4 9.4 9.7  MG 1.8  --   --  1.9 1.8 2.0 1.6*  AST 29   < > 17 20 35 60* 59*  ALT 21   < > 13 20 22  43 56*  ALKPHOS 57   < > 46 65 91 114 85  BILITOT 1.2   < > 0.7 0.5 0.4 0.1* 0.6   < > = values in this interval not displayed.   ------------------------------------------------------------------------------------------------------------------ Recent Labs    09/06/20 0320  TRIG 188*    Lab Results  Component Value Date   HGBA1C 7.0 (H) 08/24/2020    ------------------------------------------------------------------------------------------------------------------ No results for input(s): TSH, T4TOTAL, T3FREE, THYROIDAB in the last 72 hours.  Invalid input(s): FREET3 ------------------------------------------------------------------------------------------------------------------ No results for input(s): VITAMINB12, FOLATE, FERRITIN, TIBC, IRON, RETICCTPCT in the last 72 hours.  Coagulation profile No results for input(s): INR, PROTIME in the last 168 hours.  Recent Labs    09/05/20 0355 09/06/20 0320  DDIMER 1.89* 1.43*    Cardiac Enzymes No results for input(s): CKMB, TROPONINI, MYOGLOBIN in the last 168 hours.  Invalid input(s): CK ------------------------------------------------------------------------------------------------------------------    Component Value Date/Time   BNP 216.8 (H) 09/07/2020 0118    Micro Results No results found for this or any previous visit (from the past 240 hour(s)).  Radiology Reports CT ABDOMEN PELVIS WO CONTRAST  Addendum Date: 09/01/2020   ADDENDUM REPORT: 09/01/2020 14:53 ADDENDUM: Critical Value/emergent results were called by telephone at the time of interpretation on 09/01/2020 at 1447 hours to Dr. Oren Binet , who verbally acknowledged these results. Electronically Signed   By: Genevie Ann M.D.   On: 09/01/2020 14:53   Result Date: 09/01/2020 CLINICAL DATA:  69 year old female with abdominal pain. COVID-19. History of colon cancer status post resection. EXAM: CT ABDOMEN AND PELVIS WITHOUT CONTRAST TECHNIQUE: Multidetector CT imaging of the abdomen and pelvis was performed following the standard protocol without IV contrast. COMPARISON:  CTA chest yesterday.  CT Abdomen and Pelvis 06/07/2014. FINDINGS: Lower chest: Extensive lower lung pneumonia. No pericardial or pleural effusion. Hepatobiliary: Pneumoperitoneum and intermediate density free fluid adjacent to the liver. Vicarious  contrast excretion to the gallbladder. Grossly normal liver parenchyma. Pancreas: Negative noncontrast pancreas. Spleen: Negative. Adrenals/Urinary Tract: Stable low-density bilateral adrenal adenomas since 2015. Chronic renal parapelvic cysts redemonstrated. No definite hydronephrosis. No acute perinephric stranding. Both ureters are decompressed. Excreted IV contrast in the urinary bladder. Stomach/Bowel: Left lower quadrant ostomy seen in 2015 has been taken down, along with completion total colectomy since that time. 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 There is abnormal free air and intermediate density free fluid in the upper abdomen. Free air continues in the ventral small bowel mesentery toward the pelvis. And free air tracks along the right ileostomy loop. Oral contrast was administered, and has not leaked from the stomach, duodenum, or proximal jejunum. No abnormally dilated loops. And the specific site of bowel perforation is unclear. There are several small bowel loops which might be adhered to the ventral abdominal wall abutting the greater curve of the stomach. Vascular/Lymphatic: Vascular patency is not evaluated in the absence of IV contrast. Mild Calcified aortic atherosclerosis. Normal caliber abdominal aorta. No lymphadenopathy. Reproductive: Retroverted uterus suspected, with surgically absent rectum. Other: Moderate pelvic free fluid with simple fluid density, similar to the  2015 CT. Distal colon appear surgically absent. Musculoskeletal: Advanced disc and endplate degeneration throughout the visible spine. No acute or suspicious osseous lesion identified. IMPRESSION: 1. Positive for bowel perforation: Pneumoperitoneum and intermediate density free fluid in the abdomen. Prior total colectomy. The specific site of perforation is unclear-oral contrast present to the proximal jejunum has not obviously leaked. Note that there may be small bowel loops adherent to the ventral abdominal wall along  the greater curve of the stomach. 2. Extensive bilateral lower lung pneumonia. No pleural effusion. 3. Other abdominal and pelvic viscera are stable since 2015, including bilateral adrenal adenomas. Chronic renal parapelvic cysts. 4. Aortic Atherosclerosis (ICD10-I70.0). Electronically Signed: By: Genevie Ann M.D. On: 09/01/2020 14:40   DG Abd 1 View  Result Date: 09/05/2020 CLINICAL DATA:  Nausea EXAM: ABDOMEN - 1 VIEW COMPARISON:  09/04/2020 abdominal radiograph FINDINGS: Enteric tube terminates in the body of the stomach. No dilated small bowel loops. Minimal colonic gas. No evidence of pneumatosis or pneumoperitoneum. Degenerative changes in the lumbar spine. No radiopaque nephrolithiasis. IMPRESSION: Enteric tube terminates in the body of the stomach. Nonobstructive bowel gas pattern. Electronically Signed   By: Ilona Sorrel M.D.   On: 09/05/2020 08:33   CT Angio Chest PE W and/or Wo Contrast  Result Date: 08/31/2020 CLINICAL DATA:  Hypoxia, positive COVID EXAM: CT ANGIOGRAPHY CHEST WITH CONTRAST TECHNIQUE: Multidetector CT imaging of the chest was performed using the standard protocol during bolus administration of intravenous contrast. Multiplanar CT image reconstructions and MIPs were obtained to evaluate the vascular anatomy. CONTRAST:  42mL OMNIPAQUE IOHEXOL 350 MG/ML SOLN COMPARISON:  None. FINDINGS: Cardiovascular: Satisfactory opacification of the pulmonary arteries to the segmental level. No evidence of pulmonary embolism. Normal heart size. No pericardial effusion. Mediastinum/Nodes: No enlarged lymph nodes. Lungs/Pleura: Bilateral areas of consolidation and ground-glass density with a lower lobe predominance. No pleural effusion. No pneumothorax. Upper Abdomen: Partially imaged left adrenal nodule was present on 2015 abdominal CT. Right adrenal nodule was also present in 2015. Musculoskeletal: Degenerative changes of the included spine. Review of the MIP images confirms the above findings.  IMPRESSION: No evidence of acute pulmonary embolism. COVID-19 pneumonia. Electronically Signed   By: Macy Mis M.D.   On: 08/31/2020 16:19   US RENAL  Result Date: 08/25/2020 CLINICAL DATA:  Acute kidney injury. EXAM: RENAL / URINARY TRACT ULTRASOUND COMPLETE COMPARISON:  Abdominal CT 06/07/2014 FINDINGS: Right Kidney: Renal measurements: 10.7 x 5.2 x 5.0 cm = volume: 146 mL. Cystic changes at the renal hila likely parapelvic cysts as seen on prior CT. Normal renal parenchymal echogenicity. No shadowing calculus. No solid renal mass. Left Kidney: Renal measurements: 10.7 x 6.2 x 5.7 cm = volume: 197 mL. Cystic changes at the renal hila likely parapelvic cyst is seen on prior CT. Normal renal parenchymal echogenicity. No shadowing calculus. No solid renal mass. Bladder: Appears normal for degree of bladder distention. Both ureteral jets are seen. Other: None. IMPRESSION: Cystic changes at the renal hila likely parapelvic cysts as seen on prior CT. Mild bilateral hydronephrosis is felt less likely as both ureteral jets are seen in the bladder. Electronically Signed   By: Keith Rake M.D.   On: 08/25/2020 01:55   DG Chest Port 1 View  Result Date: 09/06/2020 CLINICAL DATA:  Shortness of breath and cough. History of COVID-19 positive status EXAM: PORTABLE CHEST 1 VIEW COMPARISON:  September 05, 2020 FINDINGS: Central catheter tip is in the superior vena cava. Nasogastric tube tip and side port are below  the diaphragm. No pneumothorax. Airspace opacity is again noted in the mid and lower lung regions, similar to 1 day prior. Heart is upper normal in size with pulmonary vascularity normal. No adenopathy. There is aortic atherosclerosis. There is degenerative change in the left shoulder region. IMPRESSION: Tube and catheter positions as described without pneumothorax. Airspace opacity consistent with multifocal pneumonia bilaterally, stable. No new opacity evident. Stable cardiac silhouette. Aortic  Atherosclerosis (ICD10-I70.0). Electronically Signed   By: Lowella Grip III M.D.   On: 09/06/2020 08:03   DG Chest Port 1 View  Result Date: 09/05/2020 CLINICAL DATA:  Dyspnea, atrial fibrillation, COVID positive EXAM: PORTABLE CHEST 1 VIEW COMPARISON:  09/03/2020 chest radiograph. FINDINGS: Enteric tube enters stomach with the tip not seen on this image. Right PICC terminates over the lower third of the SVC. Stable cardiomediastinal silhouette with normal heart size. No pneumothorax. No pleural effusion. Extensive patchy opacities throughout the mid to lower lungs bilaterally, not substantially changed. IMPRESSION: Extensive patchy opacities throughout the mid to lower lungs bilaterally, not substantially changed, compatible with COVID-19 pneumonia. Electronically Signed   By: Ilona Sorrel M.D.   On: 09/05/2020 08:34   DG CHEST PORT 1 VIEW  Result Date: 09/03/2020 CLINICAL DATA:  PICC line placement.  COVID 19 positive. EXAM: PORTABLE CHEST 1 VIEW COMPARISON:  Chest x-ray dated August 31, 2020. FINDINGS: New right upper extremity PICC line with the tip in the distal SVC. Enteric tube tip at the gastroesophageal junction. Stable cardiomediastinal silhouette. Bilateral mid and lower lung patchy opacities are not significantly changed. Postsurgical changes in the right upper lobe again noted. No pleural effusion or pneumothorax. No acute osseous abnormality. IMPRESSION: 1. New right upper extremity PICC line with tip in the distal SVC. 2. Enteric tube tip at the gastroesophageal junction. Recommend advancement. 3. Unchanged multifocal pneumonia. Electronically Signed   By: Titus Dubin M.D.   On: 09/03/2020 13:00   DG Chest Portable 1 View  Result Date: 08/31/2020 CLINICAL DATA:  Dyspnea at home health check up this morning, had COVID-19 diagnosis 10 days prior, recent discharge, new hypoxia EXAM: PORTABLE CHEST 1 VIEW COMPARISON:  Portable exam 1237 hours compared to 08/27/2020 FINDINGS: Normal  heart size, mediastinal contours, and pulmonary vascularity. Atherosclerotic calcification aorta. Persistent infiltrates identified in the mid to lower lungs bilaterally consistent with multifocal pneumonia and COVID-19. Appearance unchanged from previous exam. No pleural effusion or pneumothorax. IMPRESSION: Persistent BILATERAL pulmonary infiltrates consistent with multifocal pneumonia and COVID-19. Electronically Signed   By: Lavonia Dana M.D.   On: 08/31/2020 12:45   DG Chest Port 1 View  Result Date: 08/27/2020 CLINICAL DATA:  Shortness of breath EXAM: PORTABLE CHEST 1 VIEW COMPARISON:  08/25/2020 FINDINGS: No significant interval change in AP portable examination with heterogeneous bibasilar airspace opacity and possible small layering pleural effusions. No new airspace opacity. Cardiomegaly. IMPRESSION: No significant interval change in AP portable examination with heterogeneous bibasilar airspace opacity and possible small layering pleural effusions, consistent with multifocal infection. No new airspace opacity. Electronically Signed   By: Eddie Candle M.D.   On: 08/27/2020 11:04   DG Chest Port 1 View  Result Date: 08/25/2020 CLINICAL DATA:  Shortness of breath.  COVID-19 positive. EXAM: PORTABLE CHEST 1 VIEW COMPARISON:  August 23, 2020. FINDINGS: Stable cardiomediastinal silhouette. No pneumothorax is noted. Stable bilateral lung opacities are noted concerning for multifocal pneumonia. Bony thorax is unremarkable. IMPRESSION: Stable bilateral lung opacities are noted concerning for multifocal pneumonia. Electronically Signed   By: Sabino Dick  Jr M.D.   On: 08/25/2020 09:36   DG Chest Port 1 View  Result Date: 08/23/2020 CLINICAL DATA:  Shortness of breath EXAM: PORTABLE CHEST 1 VIEW COMPARISON:  None. FINDINGS: There is ill-defined airspace opacity in portions of each mid and lower lung region. Heart size and pulmonary vascularity are normal. No adenopathy. There is degenerative change in  the left shoulder. IMPRESSION: Ill-defined airspace opacity in the mid and lower lung regions, most indicative of multifocal pneumonia. Question atypical organism pneumonia given this appearance. Check of COVID-19 status in this regard advised. Heart size within normal limits.  No adenopathy appreciable. Electronically Signed   By: Lowella Grip III M.D.   On: 08/23/2020 11:33   DG Abd Portable 1V  Result Date: 09/04/2020 CLINICAL DATA:  Nausea.  COVID positive. EXAM: PORTABLE ABDOMEN - 1 VIEW COMPARISON:  Abdominal radiograph earlier today.  CT 09/01/2020 FINDINGS: Enteric tube in place with tip and side-port below the diaphragm. Drainage catheter projects in the right abdomen. There is no bowel dilatation to suggest obstruction. No radiopaque calculi. Bilateral airspace disease in the included lung bases. IMPRESSION: 1. No evidence of bowel obstruction. 2. Enteric tube in place with tip and side-port below the diaphragm. Drainage catheter in the right abdomen. Electronically Signed   By: Keith Rake M.D.   On: 09/04/2020 16:59   DG Abd Portable 1V  Result Date: 09/04/2020 CLINICAL DATA:  NG tube placement EXAM: PORTABLE ABDOMEN - 1 VIEW COMPARISON:  03/03/2020 FINDINGS: NG tube has been advanced slightly with the tip in the proximal stomach. The side port is in the distal esophagus just above the GE junction. Bilateral lower lobe airspace opacities. IMPRESSION: NG tube tip in the proximal stomach with the side port in the distal esophagus. Electronically Signed   By: Rolm Baptise M.D.   On: 09/04/2020 02:08   DG Abd Portable 1V  Result Date: 09/03/2020 CLINICAL DATA:  Check gastric catheter placement EXAM: PORTABLE ABDOMEN - 1 VIEW COMPARISON:  None. FINDINGS: Gastric catheter is noted in the distal esophagus just short of the gastroesophageal junction. This should be advanced several cm deeper into the stomach. If it has been advanced since the prior chest x-ray from 1242 hours there may be  looping in the pharynx. Clinical correlation is recommended. Patchy airspace disease is again identified in both lungs. IMPRESSION: Gastric catheter stable in appearance. This should be advanced several cm as described. Electronically Signed   By: Inez Catalina M.D.   On: 09/03/2020 23:38   DG UGI W SINGLE CM (SOL OR THIN BA)  Result Date: 09/06/2020 CLINICAL DATA:  69 year old female with history of perforated duodenal ulcer. COVID-19 infection. EXAM: UPPER GI SERIES WITH KUB TECHNIQUE: After obtaining a scout radiograph a limited upper GI series was performed after injection of the indwelling nasogastric tube with 80 mL of Omnipaque 300. FLUOROSCOPY TIME:  Fluoroscopy Time:  1 minutes and 36 seconds. Radiation Exposure Index (if provided by the fluoroscopic device): 27.6 mGy Number of Acquired Spot Images: No priors. COMPARISON:  None. FINDINGS: Preprocedural KUB demonstrates a nasogastric tube extending into the distal body of the stomach. Surgical drain extending into the right-side of the abdomen. Right-sided colostomy. Real-time imaging was performed during infusion of the indwelling nasogastric tube with 80 mL of Omnipaque 300. At no time during the examination was extravasation of contrast material observed. Specifically, no definite extravasation was identified in the region of the duodenum. IMPRESSION: 1. Limited examination demonstrating no evidence of persistent perforation in the  duodenum. Electronically Signed   By: Vinnie Langton M.D.   On: 09/06/2020 16:30   ECHOCARDIOGRAM COMPLETE  Result Date: 09/01/2020    ECHOCARDIOGRAM REPORT   Patient Name:   DORETTE HARTEL Date of Exam: 09/01/2020 Medical Rec #:  [id number]    Height:       68.0 in Accession #:    [id number]   Weight:       219.8 lb Date of Birth:  27-Sep-1951    BSA:          2.127 m Patient Age:    85 years     BP:           103/57 mmHg Patient Gender: F            HR:           77 bpm. Exam Location:  Inpatient Procedure: 2D Echo  Indications:    atrial fibrillation 427.31  History:        Patient has no prior history of Echocardiogram examinations.                 Risk Factors:Hypertension, Diabetes and Dyslipidemia. Covid +.  Sonographer:    Jannett Celestine RDCS (AE) Referring Phys: [id number] AMY N COX  Sonographer Comments: Technically difficult study due to poor echo windows. Restricted mobility. IMPRESSIONS  1. Left ventricular ejection fraction, by estimation, is 70 to 75%. The left ventricle has hyperdynamic function. Left ventricular endocardial border not optimally defined to evaluate regional wall motion, but appears grossly normal. There is mild left ventricular hypertrophy.  2. RV is not well visualized due to image quality. Right ventricular systolic function is mildly reduced. The right ventricular size is mildly enlarged. TR signal inadequate for assessing RVSP.  3. The mitral valve is normal in structure. Trivial mitral valve regurgitation. No evidence of mitral stenosis.  4. The aortic valve is tricuspid. Aortic valve regurgitation is not visualized. No aortic stenosis is present.  5. The inferior vena cava is normal in size with greater than 50% respiratory variability, suggesting right atrial pressure of 3 mmHg. FINDINGS  Left Ventricle: Midcavitary gradient 17.5 mmHg, Vmax 2 m/s. Left ventricular ejection fraction, by estimation, is 70 to 75%. The left ventricle has hyperdynamic function. Left ventricular endocardial border not optimally defined to evaluate regional wall motion. The left ventricular internal cavity size was normal in size. There is mild left ventricular hypertrophy. Left ventricular diastolic parameters are indeterminate. Right Ventricle: RV is not well visualized. The right ventricular size is mildly enlarged. No increase in right ventricular wall thickness. Right ventricular systolic function is mildly reduced. Tricuspid regurgitation signal is inadequate for assessing PA pressure. Left Atrium: Left atrial  size was normal in size. Right Atrium: Right atrial size was normal in size. Pericardium: There is no evidence of pericardial effusion. Mitral Valve: The mitral valve is normal in structure. Trivial mitral valve regurgitation. No evidence of mitral valve stenosis. Tricuspid Valve: The tricuspid valve is normal in structure. Tricuspid valve regurgitation is not demonstrated. No evidence of tricuspid stenosis. Aortic Valve: The aortic valve is tricuspid. Aortic valve regurgitation is not visualized. No aortic stenosis is present. Pulmonic Valve: The pulmonic valve was grossly normal. Pulmonic valve regurgitation is trivial. No evidence of pulmonic stenosis. Aorta: The aortic root is normal in size and structure. Venous: The inferior vena cava is normal in size with greater than 50% respiratory variability, suggesting right atrial pressure of 3 mmHg. IAS/Shunts: No atrial level shunt detected by color flow  Doppler.  LEFT VENTRICLE PLAX 2D LVIDd:         3.45 cm LVIDs:         2.30 cm LV PW:         1.20 cm LV IVS:        1.15 cm LVOT diam:     2.20 cm LV SV:         70 LV SV Index:   33 LVOT Area:     3.80 cm  LEFT ATRIUM           Index LA diam:      3.20 cm 1.50 cm/m LA Vol (A2C): 32.9 ml 15.46 ml/m  AORTIC VALVE LVOT Vmax:   101.00 cm/s LVOT Vmean:  85.000 cm/s LVOT VTI:    0.185 m  AORTA Ao Root diam: 2.90 cm MITRAL VALVE MV Area (PHT): 2.95 cm    SHUNTS MV Decel Time: 257 msec    Systemic VTI:  0.18 m MV E velocity: 78.00 cm/s  Systemic Diam: 2.20 cm Cherlynn Kaiser MD Electronically signed by Cherlynn Kaiser MD Signature Date/Time: 09/01/2020/11:02:05 AM    Final    VAS [place] LOWER EXTREMITY VENOUS (DVT)  Result Date: 08/24/2020  Lower Venous DVTStudy Indications: Elevated d-dimer, covid.  Comparison Study: No prior studies. Performing Technologist: Darlin Coco  Examination Guidelines: A complete evaluation includes B-mode imaging, spectral Doppler, color Doppler, and power Doppler as needed of all  accessible portions of each vessel. Bilateral testing is considered an integral part of a complete examination. Limited examinations for reoccurring indications may be performed as noted. The reflux portion of the exam is performed with the patient in reverse Trendelenburg.  +---------+---------------+---------+-----------+----------+--------------+ RIGHT    CompressibilityPhasicitySpontaneityPropertiesThrombus Aging +---------+---------------+---------+-----------+----------+--------------+ CFV      Full           Yes      Yes                                 +---------+---------------+---------+-----------+----------+--------------+ SFJ      Full                                                        +---------+---------------+---------+-----------+----------+--------------+ FV Prox  Full                                                        +---------+---------------+---------+-----------+----------+--------------+ FV Mid   Full                                                        +---------+---------------+---------+-----------+----------+--------------+ FV DistalFull                                                        +---------+---------------+---------+-----------+----------+--------------+ PFV  Full                                                        +---------+---------------+---------+-----------+----------+--------------+ POP      Full           Yes      Yes                                 +---------+---------------+---------+-----------+----------+--------------+ PTV      Full                                                        +---------+---------------+---------+-----------+----------+--------------+ PERO     Full                                                        +---------+---------------+---------+-----------+----------+--------------+   +---------+---------------+---------+-----------+----------+--------------+  LEFT     CompressibilityPhasicitySpontaneityPropertiesThrombus Aging +---------+---------------+---------+-----------+----------+--------------+ CFV      Full           Yes      Yes                                 +---------+---------------+---------+-----------+----------+--------------+ SFJ      Full                                                        +---------+---------------+---------+-----------+----------+--------------+ FV Prox  Full                                                        +---------+---------------+---------+-----------+----------+--------------+ FV Mid   Full                                                        +---------+---------------+---------+-----------+----------+--------------+ FV DistalFull                                                        +---------+---------------+---------+-----------+----------+--------------+ PFV      Full                                                        +---------+---------------+---------+-----------+----------+--------------+  POP      Full           Yes      Yes                                 +---------+---------------+---------+-----------+----------+--------------+ PTV      Full                                                        +---------+---------------+---------+-----------+----------+--------------+ PERO     Full                                                        +---------+---------------+---------+-----------+----------+--------------+     Summary: RIGHT: - There is no evidence of deep vein thrombosis in the lower extremity.  - No cystic structure found in the popliteal fossa.  LEFT: - There is no evidence of deep vein thrombosis in the lower extremity.  - No cystic structure found in the popliteal fossa.  *See table(s) above for measurements and observations. Electronically signed by Deitra Mayo MD on 08/24/2020 at 5:36:23 PM.    Final    [place] EKG  SITE RITE  Result Date: 09/03/2020 If Site Rite image not attached, placement could not be confirmed due to current cardiac rhythm.  [place] EKG SITE RITE  Result Date: 09/03/2020 If Site Rite image not attached, placement could not be confirmed due to current cardiac rhythm.

## 2020-09-07 NOTE — Op Note (Signed)
Central Venous Catheter Insertion Procedure Note  Candace [name]  [id number]  1951/10/19  Date:09/07/20  Time:10:00 PM   Provider Performing:Advika Mclelland R Zethan Alfieri   Procedure: Insertion of Non-tunneled Central Venous Catheter(36556) without US guidance  Indication(s) Medication administration  Consent Unable to obtain consent due to emergent nature of procedure.  Anesthesia Topical only with 1% lidocaine   Timeout Verified patient identification, verified procedure, site/side was marked, verified correct patient position, special equipment/implants available, medications/allergies/relevant history reviewed, required imaging and test results available.  Sterile Technique Maximal sterile technique including full sterile barrier drape, hand hygiene, sterile gown, sterile gloves, mask, hair covering, sterile ultrasound probe cover (if used).  Procedure Description Area of catheter insertion was cleaned with chlorhexidine and draped in sterile fashion.   A central venous catheter was placed into the left subclavian vein using the schlesinger technique . Nonpulsatile blood flow and easy flushing noted in all ports.  The catheter was sutured in place and sterile dressing applied.  Complications/Tolerance None; patient tolerated the procedure well. Chest X-ray is ordered to verify placement for internal jugular or subclavian cannulation.     EBL Minimal  Specimen(s) None

## 2020-09-07 NOTE — Progress Notes (Signed)
CRITICAL VALUE ALERT  Critical Value:  Lactic acid 9.9  Date & Time Notied:  09/07/20 2328   Provider Notified: Georgann Housekeeper   Orders Received/Actions taken: Repeat Lactic Acid

## 2020-09-07 NOTE — Progress Notes (Signed)
Pt had a change in condition around 1800 hours, increased HR, R, and decrease in B/P. Rapid response notified

## 2020-09-07 NOTE — Progress Notes (Signed)
PHARMACY - TOTAL PARENTERAL NUTRITION CONSULT NOTE  Indication:  Intolerance to EN  Patient Measurements: Height: 5\' 8"  (172.7 cm) Weight: 99.7 kg (219 lb 12.8 oz) IBW/kg (Calculated) : 63.9 TPN AdjBW (KG): 72.9 Body mass index is 33.42 kg/m.  Assessment:  61 YOF recently admitted (10/25 > 10/31) with Covid-19 and discharged home on supplemental oxygen and on Eliquis for elevated d-dimer. She returned to the ED for elevated heart rate, found to be in Afib RVR.  She developed abdominal pain that progressively got worse and CT shows bowel perforation, pneumoperitoneum and intermediate density free fluid in the abdomen. Of note, patient has a prior h/o partial colectomy/colostomy for colon cancer, completion colectomy and ileostomy. S/pex-lap with LoA, modified Graham patch repair of perforated duodenal ulceron 09/01/20.  Pharmacy consulted to manage TPN due to baseline malnutrition and likely prolonged inability to tolerate PO feeds.   Glucose / Insulin: hx DM - CBGs elevated from steroid, TPN and D5W  CBGs improving 205-275 (SM dose reduced 11/5, 11/6, and last dose of 10 mg x 1 on 11/7) Required 45 units rSSI + Levemir 20units BID Electrolytes: Na/CL down to 146/115 (no Na in TPN, D5W at 100/hr), Phos low normal (unable to increase in TPN d/t NaPhos shortage), Mag 1.6, CoCa elevated at 11.54 (Ca x Phos = 29, goal < 55) Renal: SCr down 0.75, BUN 26 LFTs / TGs: AST/ALT up 59/56, tbili WNL, TG elevated at 188 Prealbumin / albumin: prealbumin WNL at 26.3, albumin 1.8 Intake / Output; MIVF: UOP 1.1 ml/kg/hr, NG 254ml, drain 60 ml, stool 470mL, net -8.2L (up) GI Imaging: none since TPN Surgeries / Procedures: none since TPN  Central access: triple lumen PICC placed 09/03/20 TPN start date: 09/03/20  Nutritional Goals (per RD on 11/5): kCal: 2200-2450, Protein: 115-130gms , Fluid >/= 2L/day Goal TPN rate = 90 ml/hr (57 g/L AA, 16% CHO and 63 g/L ILE) = 123g AA and 2294 kCal  Current  Nutrition:  TPN  Plan:  Unable to advance TPN to goal rate for the past 4 days; therefore, adjust TPN to provide goal protein and lipid (ILE 20% of kCal d/t elevated TG) - maintaining CHO amount until CBGs are controlled  TPN at 80 ml/hr will provide 125g AA, 150g CHO and 48g ILE for a total of 1488 kCal, meeting 67% if minimal kCal and 100% of protein needs Electrolytes in TPN: no Na, K 7mEq/L, Ca removed 11/8, Mag 56mEq/L, Phos 18mmol/L, max acetate - maintaining K/Phos in TPN, increasing Mag with increased TPN rate Add standard MVI and trace elements to TPN  Continue resistant SSI Q4H + Levemir 20 units SQ BID.  MD managing outside of TPN. D5W at 100 ml/hr per MD Mag sulfate 2gm IV x1 per MD F/U AM labs, watch TG, CBGs to advance TPN   Candace [name], PharmD, BCPS, [place] 09/07/2020, 8:36 AM

## 2020-09-07 NOTE — Op Note (Signed)
Intubation Procedure Note  Candace [name]  [id number]  1950-12-09  Date:09/07/20  Time:9:57 PM   Provider Performing:Thomasenia Dowse R Jaliah Foody    Procedure: Intubation (31500)  Indication(s) Respiratory Failure  Consent Unable to obtain consent due to emergent nature of procedure.   Anesthesia None   Time Out Verified patient identification, verified procedure, site/side was marked, verified correct patient position, special equipment/implants available, medications/allergies/relevant history reviewed, required imaging and test results available.   Sterile Technique Usual hand hygeine, masks, and gloves were used   Procedure Description Patient positioned in bed supine.  Sedation given as noted above.  Patient was intubated with endotracheal tube using 3 miller .  View was Grade 2 only posterior commissure .  Number of attempts was 1.  Colorimetric CO2 detector was consistent with tracheal placement. Bilateral BS present.     Complications/Tolerance None; patient tolerated the procedure well. Chest X-ray is ordered to verify placement.   EBL none   Specimen(s) None

## 2020-09-07 NOTE — Progress Notes (Signed)
[facility] Progress Note  6 Days Post-Op  Subjective: CC-  Main complaint continues to be NG tube. Order was placed to have this removed but that has not been done. She only reports abdominal pain during vac changes. She had issues with vac clogging again last night, order to change to wet to dry dressing changes was placed but vac remains in place.  Ileostomy functioning.   Objective: Vital signs in last 24 hours: Temp:  [97.6 F (36.4 C)-98.5 F (36.9 C)] 97.6 F (36.4 C) (11/09 0825) Pulse Rate:  [98-107] 100 (11/09 0825) Resp:  [21-26] 23 (11/09 0825) BP: (107-128)/(55-68) 116/59 (11/09 0825) SpO2:  [95 %-100 %] 98 % (11/09 0825) Last BM Date:  (colcostomy bag)  Intake/Output from previous day: 11/08 0701 - 11/09 0700 In: 731.7 [I.V.:731.7] Out: 3285 [Urine:2625; Emesis/NG output:200; Drains:60; Stool:400] Intake/Output this shift: No intake/output data recorded.  PE: Gen: Alert, NAD, pleasant Pulm: rate and effort normal  WSF:KCLE,XNTZGYFVC, ND,open midline incision with large amount of clot/ no active bleeding/ clot cleaned up and wound packed with dry kerlex, drain withserousoutput. Ileostomy viable with thin bilious output in bag    Lab Results:  Recent Labs    09/06/20 0320 09/07/20 0118  WBC 11.0* 12.5*  HGB 9.9* 9.2*  HCT 33.1* 30.4*  PLT 248 233   BMET Recent Labs    09/06/20 0320 09/07/20 0118  NA 150* 146*  K 4.7 4.3  CL 118* 115*  CO2 26 25  GLUCOSE 255* 263*  BUN 32* 26*  CREATININE 0.86 0.75  CALCIUM 9.4 9.7   PT/INR No results for input(s): LABPROT, INR in the last 72 hours. CMP     Component Value Date/Time   NA 146 (H) 09/07/2020 0118   K 4.3 09/07/2020 0118   CL 115 (H) 09/07/2020 0118   CO2 25 09/07/2020 0118   GLUCOSE 263 (H) 09/07/2020 0118   BUN 26 (H) 09/07/2020 0118   CREATININE 0.75 09/07/2020 0118   CALCIUM 9.7 09/07/2020 0118   PROT 4.9 (L) 09/07/2020 0118   ALBUMIN 1.7 (L) 09/07/2020 0118   AST  59 (H) 09/07/2020 0118   ALT 56 (H) 09/07/2020 0118   ALKPHOS 85 09/07/2020 0118   BILITOT 0.6 09/07/2020 0118   GFRNONAA >60 09/07/2020 0118   Lipase     Component Value Date/Time   LIPASE 48 08/31/2020 2054       Studies/Results: DG Chest Port 1 View  Result Date: 09/06/2020 CLINICAL DATA:  Shortness of breath and cough. History of COVID-19 positive status EXAM: PORTABLE CHEST 1 VIEW COMPARISON:  September 05, 2020 FINDINGS: Central catheter tip is in the superior vena cava. Nasogastric tube tip and side port are below the diaphragm. No pneumothorax. Airspace opacity is again noted in the mid and lower lung regions, similar to 1 day prior. Heart is upper normal in size with pulmonary vascularity normal. No adenopathy. There is aortic atherosclerosis. There is degenerative change in the left shoulder region. IMPRESSION: Tube and catheter positions as described without pneumothorax. Airspace opacity consistent with multifocal pneumonia bilaterally, stable. No new opacity evident. Stable cardiac silhouette. Aortic Atherosclerosis (ICD10-I70.0). Electronically Signed   By: Lowella Grip III M.D.   On: 09/06/2020 08:03   DG UGI W SINGLE CM (SOL OR THIN BA)  Result Date: 09/06/2020 CLINICAL DATA:  69 year old female with history of perforated duodenal ulcer. COVID-19 infection. EXAM: UPPER GI SERIES WITH KUB TECHNIQUE: After obtaining a scout radiograph a limited upper GI series was performed  after injection of the indwelling nasogastric tube with 80 mL of Omnipaque 300. FLUOROSCOPY TIME:  Fluoroscopy Time:  1 minutes and 36 seconds. Radiation Exposure Index (if provided by the fluoroscopic device): 27.6 mGy Number of Acquired Spot Images: No priors. COMPARISON:  None. FINDINGS: Preprocedural KUB demonstrates a nasogastric tube extending into the distal body of the stomach. Surgical drain extending into the right-side of the abdomen. Right-sided colostomy. Real-time imaging was performed during  infusion of the indwelling nasogastric tube with 80 mL of Omnipaque 300. At no time during the examination was extravasation of contrast material observed. Specifically, no definite extravasation was identified in the region of the duodenum. IMPRESSION: 1. Limited examination demonstrating no evidence of persistent perforation in the duodenum. Electronically Signed   By: Vinnie Langton M.D.   On: 09/06/2020 16:30    Anti-infectives: Anti-infectives (From admission, onward)   Start     Dose/Rate Route Frequency Ordered Stop   09/02/20 1600  cefTRIAXone (ROCEPHIN) 1 g in sodium chloride 0.9 % 100 mL IVPB  Status:  Discontinued        1 g 200 mL/hr over 30 Minutes Intravenous Every 24 hours 09/01/20 1811 09/02/20 0838   09/01/20 1800  fluconazole (DIFLUCAN) IVPB 400 mg        400 mg 50 mL/hr over 240 Minutes Intravenous  Once 09/01/20 1749 09/02/20 0603   09/01/20 1530  piperacillin-tazobactam (ZOSYN) IVPB 3.375 g        3.375 g 12.5 mL/hr over 240 Minutes Intravenous Every 8 hours 09/01/20 1514 09/05/20 2111   09/01/20 1000  levofloxacin (LEVAQUIN) tablet 250 mg  Status:  Discontinued        250 mg Oral Daily 08/31/20 1508 08/31/20 1735   08/31/20 1730  cefTRIAXone (ROCEPHIN) 1 g in sodium chloride 0.9 % 100 mL IVPB  Status:  Discontinued        1 g 200 mL/hr over 30 Minutes Intravenous Every 24 hours 08/31/20 1726 09/01/20 1513   08/31/20 1730  azithromycin (ZITHROMAX) 500 mg in sodium chloride 0.9 % 250 mL IVPB  Status:  Discontinued        500 mg 250 mL/hr over 60 Minutes Intravenous Every 24 hours 08/31/20 1726 09/02/20 0838       Assessment/Plan HTN HLD DM Hx colon cancer s/p partial colectomy/colostomy followed by completion colectomy and ileostomy Hypoxia requiring 3L O2 Covid-19 pneumonia  Elevated D dimer on eliquis -eliquis reversed with kcentra, hold eliquis A fib RVR Anemia - Hgb9.2 from 9.9, monitor 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 Bobbye Morton -POD#6 - continue PPI BID -completed4 days IV zosyn - UGI negative for leak. D/c NG tube and advance to clear liquids.  - continue JP drain and monitor output now that she is starting a diet - d/c wound vac and transition to BID wet to dry dressing changes - continue PT/OT, mobilize. Therapies currently recommending SNF but patient/family have refused in the past  ID -zosyn 11/3>>11/8. WBC slightly up 12.5, monitor for now VTE -therapeutic lovenox FEN -IVF, CLD, TPN (continue for now until we can advance her diet further and she is reliably tolerating) Foley -d/c 11/5 Follow up -Dr. Chrystie Nose - daughter [name] [phone]    LOS: 6 days    Wellington [place], [facility] 09/07/2020, 11:12 AM Please see Amion for pager number during day  hours 7:00am-4:30pm

## 2020-09-07 NOTE — Progress Notes (Signed)
IV team consult note: Spoke to ICU unit RN; stated,  TPA already pulled by unit staff without success. Patient is getting a new CVL at this time.

## 2020-09-07 NOTE — Progress Notes (Signed)
Called pt. Daughter, [name] @ [phone] to give her updates on patient.  Daughter requested if surgery could give her a call with updates.  Paged Thedore Mins, [place] with surgery with returned call and asked if she could give the daughter a call with updates.  Will continue to update as needed.  Alphonzo Lemmings, RN

## 2020-09-07 NOTE — Progress Notes (Signed)
Nurse called to room by tech after discovering wound vac alarm and patient bleeding small amount around abdominal wound vac dressing. Unable to successfully reinforce wound vac dressing. On call surgery serviced called. Awaiting reply. No s/s of distress from patient.

## 2020-09-07 NOTE — Progress Notes (Signed)
CRITICAL VALUE ALERT  Critical Value:  hgb 6.9  Date & Time Notied: 09/07/2020  1721   Provider Notified: Dr. Candiss Norse   Orders Received/Actions taken: blood transfusion ordered

## 2020-09-07 NOTE — Significant Event (Signed)
Rapid Response Event Note   Reason for Call :  Acute oxygen desaturation and hypotension.  Initial Focused Assessment:  Pt lying in bed, oriented. She is tachypneic, RR 55-65. Increased work of breathing. Lung sounds are clear throughout. Abdomen is soft and incision site does not appear to have additional bleeding since last assessment. Hgb 6.9. Skin is cold. Color is pale.   VS: T 97.46F, BP 87/52, HR 108, RR 60, SpO2 98% on 100% NRB  Interventions:  -PRBC started -35L/100% & 100% NRB  Plan of Care:  -Transfuse 2U PRBC, give IV Lasix between units -BiPAP, transfer to ICU  Event Summary:  MD Notified: Dr. Candiss Norse Call Time: 1800 Arrival Time: 1805 End Time: 1905, handoff given to night shift RRRN  Casimer Bilis, RN

## 2020-09-07 NOTE — Progress Notes (Addendum)
Inpatient Diabetes Program Recommendations  AACE/ADA: New Consensus Statement on Inpatient Glycemic Control (2015)  Target Ranges:  Prepandial:   less than 140 mg/dL      Peak postprandial:   less than 180 mg/dL (1-2 hours)      Critically ill patients:  140 - 180 mg/dL   Results for Candace [name], Candace [name] (MRN [id number]) as of 09/07/2020 09:57  Ref. Range 09/06/2020 05:12 09/06/2020 07:34 09/06/2020 12:16 09/06/2020 16:29 09/06/2020 20:21  Glucose-Capillary Latest Ref Range: 70 - 99 mg/dL 212 (H)  4 units NOVOLOG  222 (H)  7 units NOVOLOG  20 units LEVEMIR  260 (H)  11 units NOVOLOG  275 (H)  11 units NOVOLOG  212 (H)  12 units NOVOLOG  20 units LEVEMIR    Results for Candace [name], Candace [name] (MRN [id number]) as of 09/07/2020 09:57  Ref. Range 09/07/2020 00:01 09/07/2020 04:01 09/07/2020 07:47  Glucose-Capillary Latest Ref Range: 70 - 99 mg/dL 205 (H)  5 units NOVOLOG  247 (H)  7 units NOVOLOG  250 (H)  7 units NOVOLOG  20 units LEVEMIR     Home DM Meds: Metformin 500 mg BID  Current Orders: Levemir 20 units BID                            Novolog Resistant Correction Scale/ SSI (0-20 units) Q4 hours    Solumedrol stopped--last dose given 9:30am yesterday (11/07)  Getting TPN 80cc/hr--No Insulin in the TPN solution   MD- Currently no insulin in the TPN solution.  May consider adding Insulin to the TPN.  May consider increasing the Levemir further to 25 units BID    --Will follow patient during hospitalization--  Wyn Quaker RN, MSN, CDE Diabetes Coordinator Inpatient Glycemic Control Team Team Pager: [phone] (8a-5p)

## 2020-09-07 NOTE — Progress Notes (Signed)
[facility] Progress Note Patient Name: Candace [name] DOB: [birth year] MRN: [id number]   Date of Service  09/07/2020  HPI/Events of Note  Patient admitted to the floor with Afib in the context of recent Covid pneumonia. During this hospitalization she developed a perforated duodenal ulcer requiring exploratory laparotomy and a graham patch for repair. This evening she was transferred to the ICU for acute shortness of breath and hypotension, she was also noted to be anemic with a hemoglobin of 6.9 gm, an RRT was called, 2 units of PRBC and BIPAP were ordered, and the patient was transferred to the ICU.  eICU Interventions  New Patient Evaluation completed.        Kerry Kass [name] 09/07/2020, 8:02 PM

## 2020-09-07 NOTE — Procedures (Signed)
Arterial Catheter Insertion Procedure Note  Candace [name]  [id number]  [birth year]  Date:09/07/20  Time:9:52 PM    Provider Performing: Corey Harold    Procedure: Insertion of Arterial Line [phone]) with US guidance (03546)   Indication(s) Blood pressure monitoring and/or need for frequent ABGs  Consent Unable to obtain consent due to emergent nature of procedure.  Anesthesia None   Time Out Verified patient identification, verified procedure, site/side was marked, verified correct patient position, special equipment/implants available, medications/allergies/relevant history reviewed, required imaging and test results available.   Sterile Technique Maximal sterile technique including full sterile barrier drape, hand hygiene, sterile gown, sterile gloves, mask, hair covering, sterile ultrasound probe cover (if used).   Procedure Description Area of catheter insertion was cleaned with chlorhexidine and draped in sterile fashion. With real-time ultrasound guidance an arterial catheter was placed into the right femoral artery.  Appropriate arterial tracings confirmed on monitor.     Complications/Tolerance None; patient tolerated the procedure well.   EBL Minimal   Specimen(s) None   Candace [name], AGACNP-BC [place] for personal pager PCCM on call pager [phone]  09/07/2020 9:53 PM

## 2020-09-07 NOTE — Consult Note (Addendum)
Cardiology Consultation:   Patient ID: Candace [name]; [id number]; [birth year]   Admit date: 08/31/2020 Date of Consult: 09/07/2020  Primary Care Provider: Algis Greenhouse, MD Primary Cardiologist: No primary care provider on file. New, lives in [place] Primary Electrophysiologist:  None   Patient Profile:   Candace [name] is a 69 y.o. female with a hx of recent covid pna, obesity, colon cancer s/p colon resection with colostomy bag, HLD, NIDDM2, CKD3 presented to ED after her follow-up nurse advised her to present to ED 11/02 for elevated HR.  She is being seen today for the evaluation of Atrial fib at the request of Dr Candiss Norse.  History of Present Illness:   [name] was admitted 10/25-10/31 with COVID, d/c on O2 3 lpm. S/p Remdesivir, solu-medrol and ceftriaxone  Admitted 11/02 w/ acute hypoxic resp failure, requ [facility] O2 6 lpm, was in Afib RVR. Admitted for Afib and PNA post-COVID, AKI on CKD. Started on IV Cardizem.  Eliquis held 11/03 due to abd tenderness, on ABX. Dx w/ Pneumoperitoneum,perforated duodenal ulcer. S/pexploratory laparotomy, adhesiolysis, modified graham patch repair of perforated duodenal ulcer11/3.  Pt maintained on IV Cardizem and lovenox w/ prn IV metoprolol. Pt became hypotensive and tachycardic, developed bleeding from incision, rec'd IVF and started on amio. Cards asked to see.   [name] is feeling bad and not able to talk by phone.     Past Medical History:  Diagnosis Date  . Colon cancer ([place])   . Diabetes mellitus ([place])   . Hyperlipemia   . Hypertension     Past Surgical History:  Procedure Laterality Date  . LAPAROTOMY N/A 09/01/2020   Procedure: EXPLORATORY LAPAROTOMY; Repair of Perforated Duodenal Ulcer;  Surgeon: Jesusita Oka, MD;  Location: [facility];  Service: General;  Laterality: N/A;  . LYSIS OF ADHESION N/A 09/01/2020   Procedure: LYSIS OF ADHESION;  Surgeon: Jesusita Oka, MD;  Location: [facility];  Service: General;  Laterality: N/A;      Prior to Admission medications   Medication Sig Start Date End Date Taking? Authorizing Provider  acetaminophen (TYLENOL) 500 MG tablet Take 1,000 mg by mouth every 6 (six) hours as needed for mild pain.   Yes [provider]  albuterol (VENTOLIN HFA) 108 (90 Base) MCG/ACT inhaler Inhale 2 puffs into the lungs every 6 (six) hours as needed for wheezing or shortness of breath. 08/29/20  Yes Thurnell Lose, MD  amLODipine (NORVASC) 10 MG tablet Take 1 tablet (10 mg total) by mouth daily. 08/29/20 08/29/21 Yes Thurnell Lose, MD  apixaban (ELIQUIS) 2.5 MG TABS tablet Take 1 tablet (2.5 mg total) by mouth 2 (two) times daily. 08/29/20  Yes Thurnell Lose, MD  aspirin 81 MG EC tablet Take 81 mg by mouth daily. 06/20/18  Yes [provider]  levofloxacin (LEVAQUIN) 250 MG tablet Take 1 tablet (250 mg total) by mouth daily. 08/29/20  Yes Thurnell Lose, MD  metFORMIN (GLUCOPHAGE-XR) 500 MG 24 hr tablet Take 500 mg by mouth in the morning and at bedtime.  07/16/20  Yes [provider]  rosuvastatin (CRESTOR) 40 MG tablet Take 40 mg by mouth daily. 06/22/20  Yes [provider]  zinc gluconate 50 MG tablet Take 50 mg by mouth daily.   Yes [provider]    Inpatient Medications: Scheduled Meds: . sodium chloride   Intravenous Once  . alteplase  2 mg Intracatheter Once  . Chlorhexidine Gluconate Cloth  6 each Topical Daily  . [START  ON 09/09/2020] enoxaparin (LOVENOX) injection  100 mg Subcutaneous Q12H  . furosemide  20 mg Intravenous Once  . insulin aspart  0-20 Units Subcutaneous Q4H  . insulin detemir  25 Units Subcutaneous BID  . pantoprazole (PROTONIX) IV  40 mg Intravenous Q12H   Continuous Infusions: . TPN ADULT (ION) 40 mL/hr at 09/06/20 1724  . TPN ADULT (ION)     PRN Meds: acetaminophen, albuterol, diphenhydrAMINE, metoprolol tartrate, morphine injection, oxyCODONE, phenol, silver nitrate applicators, sodium  chloride  Allergies:   No Known Allergies  Social History:   Social History   Socioeconomic History  . Marital status: Married    Spouse name: Not on file  . Number of children: Not on file  . Years of education: Not on file  . Highest education level: Not on file  Occupational History  . Not on file  Tobacco Use  . Smoking status: Never Smoker  . Smokeless tobacco: Never Used  Vaping Use  . Vaping Use: Never used  Substance and Sexual Activity  . Alcohol use: Never  . Drug use: Never  . Sexual activity: Not on file  Other Topics Concern  . Not on file  Social History Narrative  . Not on file   Social Determinants of Health   Financial Resource Strain:   . Difficulty of Paying Living Expenses: Not on file  Food Insecurity:   . Worried About Charity fundraiser in the Last Year: Not on file  . Ran Out of Food in the Last Year: Not on file  Transportation Needs:   . Lack of Transportation (Medical): Not on file  . Lack of Transportation (Non-Medical): Not on file  Physical Activity:   . Days of Exercise per Week: Not on file  . Minutes of Exercise per Session: Not on file  Stress:   . Feeling of Stress : Not on file  Social Connections:   . Frequency of Communication with Friends and Family: Not on file  . Frequency of Social Gatherings with Friends and Family: Not on file  . Attends Religious Services: Not on file  . Active Member of Clubs or Organizations: Not on file  . Attends Archivist Meetings: Not on file  . Marital Status: Not on file  Intimate Partner Violence:   . Fear of Current or Ex-Partner: Not on file  . Emotionally Abused: Not on file  . Physically Abused: Not on file  . Sexually Abused: Not on file    Family History:  Not able to get more info due to pt condition History reviewed. No pertinent family history. Family Status: Not able to get more info due to pt condition No family status information on file.    ROS: Not able to  get more info due to pt condition Please see the history of present illness.  All other ROS reviewed and negative.     Physical Exam/Data:   Vitals:   09/07/20 1230 09/07/20 1232 09/07/20 1300 09/07/20 1508  BP: (!) 77/50  (!) 98/57 (!) 91/56  Pulse: 96  93 98  Resp: (!) 38 (!) 37 (!) 24 20  Temp: 98.4 F (36.9 C) 98.3 F (36.8 C) 98.6 F (37 C) 98.6 F (37 C)  TempSrc: Oral Oral Oral Oral  SpO2: 93%  98% 97%  Weight:      Height:        Intake/Output Summary (Last 24 hours) at 09/07/2020 1639 Last data filed at 09/07/2020 1113 Gross per 24 hour  Intake 0 ml  Output 1910 ml  Net -1910 ml    Last 3 Weights 08/31/2020 08/23/2020 08/23/2020  Weight (lbs) 219 lb 12.8 oz 220 lb 198 lb 6.6 oz  Weight (kg) 99.7 kg 99.791 kg 90 kg     Body mass index is 33.42 kg/m.    EKG:  The EKG was personally reviewed and demonstrates:  11/02 ECG is atrial fib, RVR HR 126,  Telemetry:  Telemetry was personally reviewed and demonstrates:  SR, mostly ST w/ frequent PACs and pairs   CV studies:   ECHO: 09/01/2020 1. Left ventricular ejection fraction, by estimation, is 70 to 75%. The  left ventricle has hyperdynamic function. Left ventricular endocardial  border not optimally defined to evaluate regional wall motion, but appears  grossly normal. There is mild left  ventricular hypertrophy.  2. RV is not well visualized due to image quality. Right ventricular  systolic function is mildly reduced. The right ventricular size is mildly  enlarged. TR signal inadequate for assessing RVSP.  3. The mitral valve is normal in structure. Trivial mitral valve  regurgitation. No evidence of mitral stenosis.  4. The aortic valve is tricuspid. Aortic valve regurgitation is not  visualized. No aortic stenosis is present.  5. The inferior vena cava is normal in size with greater than 50%  respiratory variability, suggesting right atrial pressure of 3 mmHg.   Laboratory Data:    Chemistry Recent Labs  Lab 09/05/20 0355 09/06/20 0320 09/07/20 0118  NA 153* 150* 146*  K 4.0 4.7 4.3  CL 119* 118* 115*  CO2 27 26 25   GLUCOSE 261* 255* 263*  BUN 37* 32* 26*  CREATININE 1.02* 0.86 0.75  CALCIUM 9.4 9.4 9.7  GFRNONAA 60* >60 >60  ANIONGAP 7 6 6     Lab Results  Component Value Date   ALT 56 (H) 09/07/2020   AST 59 (H) 09/07/2020   ALKPHOS 85 09/07/2020   BILITOT 0.6 09/07/2020   Hematology Recent Labs  Lab 09/05/20 0355 09/06/20 0320 09/07/20 0118  WBC 9.7 11.0* 12.5*  RBC 4.38 4.37 3.99  HGB 10.1* 9.9* 9.2*  HCT 33.2* 33.1* 30.4*  MCV 75.8* 75.7* 76.2*  MCH 23.1* 22.7* 23.1*  MCHC 30.4 29.9* 30.3  RDW 15.9* 15.6* 15.5  PLT 280 248 233   Cardiac Enzymes High Sensitivity Troponin:   Recent Labs  Lab 08/31/20 1220 08/31/20 1315 08/31/20 1922 08/31/20 2054  TROPONINIHS 45* 45* 36* 50*      BNP Recent Labs  Lab 09/06/20 0320 09/07/20 0118  BNP 316.8* 216.8*    DDimer  Recent Labs  Lab 09/04/20 0514 09/05/20 0355 09/06/20 0320  DDIMER 3.64* 1.89* 1.43*   TSH:  Lab Results  Component Value Date   TSH 0.015 (L) 08/31/2020   Lipids: Lab Results  Component Value Date   TRIG 188 (H) 09/06/2020   HgbA1c: Lab Results  Component Value Date   HGBA1C 7.0 (H) 08/24/2020   Magnesium:  Magnesium  Date Value Ref Range Status  09/07/2020 1.6 (L) 1.7 - 2.4 mg/dL Final    Comment:    Performed at [facility], [place] [address], [place], [place] [address]     Radiology/Studies:  DG Abd 1 View  Result Date: 09/05/2020 CLINICAL DATA:  Nausea EXAM: ABDOMEN - 1 VIEW COMPARISON:  09/04/2020 abdominal radiograph FINDINGS: Enteric tube terminates in the body of the stomach. No dilated small bowel loops. Minimal colonic gas. No evidence of pneumatosis or pneumoperitoneum. Degenerative changes in  the lumbar spine. No radiopaque nephrolithiasis. IMPRESSION: Enteric tube terminates in the body of the stomach. Nonobstructive bowel  gas pattern. Electronically Signed   By: Ilona Sorrel M.D.   On: 09/05/2020 08:33   DG Chest Port 1 View  Result Date: 09/06/2020 CLINICAL DATA:  Shortness of breath and cough. History of COVID-19 positive status EXAM: PORTABLE CHEST 1 VIEW COMPARISON:  September 05, 2020 FINDINGS: Central catheter tip is in the superior vena cava. Nasogastric tube tip and side port are below the diaphragm. No pneumothorax. Airspace opacity is again noted in the mid and lower lung regions, similar to 1 day prior. Heart is upper normal in size with pulmonary vascularity normal. No adenopathy. There is aortic atherosclerosis. There is degenerative change in the left shoulder region. IMPRESSION: Tube and catheter positions as described without pneumothorax. Airspace opacity consistent with multifocal pneumonia bilaterally, stable. No new opacity evident. Stable cardiac silhouette. Aortic Atherosclerosis (ICD10-I70.0). Electronically Signed   By: Lowella Grip III M.D.   On: 09/06/2020 08:03   DG Chest Port 1 View  Result Date: 09/05/2020 CLINICAL DATA:  Dyspnea, atrial fibrillation, COVID positive EXAM: PORTABLE CHEST 1 VIEW COMPARISON:  09/03/2020 chest radiograph. FINDINGS: Enteric tube enters stomach with the tip not seen on this image. Right PICC terminates over the lower third of the SVC. Stable cardiomediastinal silhouette with normal heart size. No pneumothorax. No pleural effusion. Extensive patchy opacities throughout the mid to lower lungs bilaterally, not substantially changed. IMPRESSION: Extensive patchy opacities throughout the mid to lower lungs bilaterally, not substantially changed, compatible with COVID-19 pneumonia. Electronically Signed   By: Ilona Sorrel M.D.   On: 09/05/2020 08:34   DG Abd Portable 1V  Result Date: 09/04/2020 CLINICAL DATA:  Nausea.  COVID positive. EXAM: PORTABLE ABDOMEN - 1 VIEW COMPARISON:  Abdominal radiograph earlier today.  CT 09/01/2020 FINDINGS: Enteric tube in place with tip  and side-port below the diaphragm. Drainage catheter projects in the right abdomen. There is no bowel dilatation to suggest obstruction. No radiopaque calculi. Bilateral airspace disease in the included lung bases. IMPRESSION: 1. No evidence of bowel obstruction. 2. Enteric tube in place with tip and side-port below the diaphragm. Drainage catheter in the right abdomen. Electronically Signed   By: Keith Rake M.D.   On: 09/04/2020 16:59   DG Abd Portable 1V  Result Date: 09/04/2020 CLINICAL DATA:  NG tube placement EXAM: PORTABLE ABDOMEN - 1 VIEW COMPARISON:  03/03/2020 FINDINGS: NG tube has been advanced slightly with the tip in the proximal stomach. The side port is in the distal esophagus just above the GE junction. Bilateral lower lobe airspace opacities. IMPRESSION: NG tube tip in the proximal stomach with the side port in the distal esophagus. Electronically Signed   By: Rolm Baptise M.D.   On: 09/04/2020 02:08   DG Abd Portable 1V  Result Date: 09/03/2020 CLINICAL DATA:  Check gastric catheter placement EXAM: PORTABLE ABDOMEN - 1 VIEW COMPARISON:  None. FINDINGS: Gastric catheter is noted in the distal esophagus just short of the gastroesophageal junction. This should be advanced several cm deeper into the stomach. If it has been advanced since the prior chest x-ray from 1242 hours there may be looping in the pharynx. Clinical correlation is recommended. Patchy airspace disease is again identified in both lungs. IMPRESSION: Gastric catheter stable in appearance. This should be advanced several cm as described. Electronically Signed   By: Inez Catalina M.D.   On: 09/03/2020 23:38   DG UGI W SINGLE CM (  SOL OR THIN BA)  Result Date: 09/06/2020 CLINICAL DATA:  69 year old female with history of perforated duodenal ulcer. COVID-19 infection. EXAM: UPPER GI SERIES WITH KUB TECHNIQUE: After obtaining a scout radiograph a limited upper GI series was performed after injection of the indwelling  nasogastric tube with 80 mL of Omnipaque 300. FLUOROSCOPY TIME:  Fluoroscopy Time:  1 minutes and 36 seconds. Radiation Exposure Index (if provided by the fluoroscopic device): 27.6 mGy Number of Acquired Spot Images: No priors. COMPARISON:  None. FINDINGS: Preprocedural KUB demonstrates a nasogastric tube extending into the distal body of the stomach. Surgical drain extending into the right-side of the abdomen. Right-sided colostomy. Real-time imaging was performed during infusion of the indwelling nasogastric tube with 80 mL of Omnipaque 300. At no time during the examination was extravasation of contrast material observed. Specifically, no definite extravasation was identified in the region of the duodenum. IMPRESSION: 1. Limited examination demonstrating no evidence of persistent perforation in the duodenum. Electronically Signed   By: Vinnie Langton M.D.   On: 09/06/2020 16:30    Assessment and Plan:   1. Atrial fibrillation, RVR - was in this on admit, rate control w/ IV Cardizem - converted to SR, ST w/ frequent PACs on 11/07 - HR generally 90-110 on the IV Cardizem and prn metoprolol till today - hypotension/tachycardia associated w/ bleeding from incision - Cardizem d/c'd and amio started - MD advise if ok to hold the lovenox - stop amio, restart if recurrent Afib  2. COVID PNA - Rx per IM, is still on O2 at 6 lpm  3. Perforated duodenal ulcer w/ surgery 11/03 w/ post-op bleeding today - mgt per IM, CCS - feel the hypotension is due to bleeding, BP should come up with treatment of this.   Otherwise, per IM Principal Problem:   Atrial fibrillation with RVR ([place]) Active Problems:   Pneumonia due to COVID-19 virus   New onset a-fib ([place])   Leukocytosis   Atrial fibrillation with rapid ventricular response ([place])     For questions or updates, please contact [facility] Please consult [email or web address] for contact info under Cardiology/STEMI.   Signed, Rosaria Ferries, PA-C   09/07/2020 4:39 PM   History and all data above reviewed.  Patient examined.  I agree with the findings as above.  We are asked to see this patient who has had intermittent atrial arrhythmias including flutter.  She has been on rate control and was on amiodarone.  She currently is in sinus tachycardia.  She has no significant prior cardiac history but has a complicated post COVID course with perforated bowel is seen.  She was on anticoagulation but currently is off of some incisional bleeding.  She looks quite uncomfortable she is really unable to give me much history.  She has hypoxemia and continued infiltrates on her chest x-ray.  Her echocardiogram demonstrates well-preserved ejection fraction.    The patient exam reveals  GENERAL:   Critically ill appearing HEENT:  Pupils equal round and reactive, fundi not visualized, oral mucosa unremarkable NECK:  No jugular venous distention, waveform within normal limits, carotid upstroke brisk and symmetric, no bruits, no thyromegaly LYMPHATICS:  No cervical, inguinal adenopathy LUNGS:  Decreased breath sound bilateral with coarse crackles BACK:  No CVA tenderness CHEST:  Unremarkable HEART:  PMI not displaced or sustained,S1 and S2 within normal limits, no S3, no S4, no clicks, no rubs, no murmurs ABD:  Distended with decreased bowel sounds and surgical wound with some oozing blood EXT:  2 plus pulses throughout, no edema, no cyanosis no clubbing SKIN:  No rashes no nodules NEURO:  Cranial nerves II through XII grossly intact, motor grossly intact throughout PSYCH:  Cognitively intact, oriented to person place and time  Agree with documented assessment and plan.   Tachycardia:  Currently in NSR.  Not an anticoagulation candidate.  The primary team has written for amiodarone and she did receive a bolus.  Drip has been ordered.  I don't think this is necessary at this time unless she has recurrent fib.  She is currently tachycardic with her acute  respiratory and GI illness.     Jeneen Rinks Nahiem Dredge  5:41 PM  09/07/2020

## 2020-09-07 NOTE — Significant Event (Signed)
Rapid Response Event Note   Reason for Call :  Bleeding from abdominal incision site.  Initial Focused Assessment:  Pt alert, lying in bed. Skin is warm, dry. Abdominal dressing saturated with blood. Blood and blood clots present on pt's bed pad. Saturated dressing removed and surgicel was placed in wound bed by B. Meuth, [place]. Site packed with kerlex and topped with 4x4 dressings and an ABD pad. Pressure and ice applied to site.  VS: T 98.6, BP 98/57, HR 101, RR 24, SpO2 98%  Interventions:  No intervention from [place] of Care:  -Consider use of abdominal binder -Monitor dressing for bleeding: apply pressure and ice -Notify attending if site continues to bleed and bleeding cannot be stopped with above measures -Do not change dressing today or tonight, unless dressing becomes soiled or saturated. Surgery to evaluate and redress site tomorrow. Per PA.  -Labs and 1U PRBC ordered  Call rapid response for additional needs  Event Summary:  MD Notified:  Call Time: [place] Time: 1300 End Time: [place], RN

## 2020-09-07 NOTE — Progress Notes (Signed)
SLP Cancellation Note  Patient Details Name: Candace [name] MRN: [id number] DOB: 12-Mar-1951   Cancelled eval:  Pt with persisting hypotension; tachycardic and tachypneic.  Will continue efforts to eval swallowing.  D/W RN.  Zailee Vallely L. Tivis Ringer, [place] CCC/SLP Acute Rehabilitation Services Office number [phone] Pager [phone]          Juan Quam [place] 09/07/2020, 3:27 PM

## 2020-09-08 ENCOUNTER — Inpatient Hospital Stay (HOSPITAL_COMMUNITY): Payer: Medicare Other

## 2020-09-08 DIAGNOSIS — L8990 Pressure ulcer of unspecified site, unspecified stage: Secondary | ICD-10-CM | POA: Insufficient documentation

## 2020-09-08 DIAGNOSIS — R578 Other shock: Secondary | ICD-10-CM

## 2020-09-08 DIAGNOSIS — N179 Acute kidney failure, unspecified: Secondary | ICD-10-CM

## 2020-09-08 DIAGNOSIS — J80 Acute respiratory distress syndrome: Secondary | ICD-10-CM

## 2020-09-08 LAB — POCT I-STAT 7, (LYTES, BLD GAS, ICA,H+H)
Acid-base deficit: 9 mmol/L — ABNORMAL HIGH (ref 0.0–2.0)
Acid-base deficit: 10 mmol/L — ABNORMAL HIGH (ref 0.0–2.0)
Acid-base deficit: 11 mmol/L — ABNORMAL HIGH (ref 0.0–2.0)
Acid-base deficit: 9 mmol/L — ABNORMAL HIGH (ref 0.0–2.0)
Acid-base deficit: 7 mmol/L — ABNORMAL HIGH (ref 0.0–2.0)
Bicarbonate: 17 mmol/L — ABNORMAL LOW (ref 20.0–28.0)
Bicarbonate: 16.8 mmol/L — ABNORMAL LOW (ref 20.0–28.0)
Bicarbonate: 16.5 mmol/L — ABNORMAL LOW (ref 20.0–28.0)
Bicarbonate: 17.8 mmol/L — ABNORMAL LOW (ref 20.0–28.0)
Bicarbonate: 19.4 mmol/L — ABNORMAL LOW (ref 20.0–28.0)
Calcium, Ion: 1.35 mmol/L (ref 1.15–1.40)
Calcium, Ion: 1.37 mmol/L (ref 1.15–1.40)
Calcium, Ion: 1.35 mmol/L (ref 1.15–1.40)
Calcium, Ion: 1.25 mmol/L (ref 1.15–1.40)
Calcium, Ion: 1.26 mmol/L (ref 1.15–1.40)
HCT: 24 % — ABNORMAL LOW (ref 36.0–46.0)
HCT: 24 % — ABNORMAL LOW (ref 36.0–46.0)
HCT: 20 % — ABNORMAL LOW (ref 36.0–46.0)
HCT: 23 % — ABNORMAL LOW (ref 36.0–46.0)
HCT: 21 % — ABNORMAL LOW (ref 36.0–46.0)
Hemoglobin: 8.2 g/dL — ABNORMAL LOW (ref 12.0–15.0)
Hemoglobin: 8.2 g/dL — ABNORMAL LOW (ref 12.0–15.0)
Hemoglobin: 6.8 g/dL — CL (ref 12.0–15.0)
Hemoglobin: 7.8 g/dL — ABNORMAL LOW (ref 12.0–15.0)
Hemoglobin: 7.1 g/dL — ABNORMAL LOW (ref 12.0–15.0)
O2 Saturation: 86 %
O2 Saturation: 83 %
O2 Saturation: 90 %
O2 Saturation: 94 %
O2 Saturation: 94 %
Patient temperature: 98
Patient temperature: 35.2
Patient temperature: 35.8
Patient temperature: 98.6
Patient temperature: 98.6
Potassium: 5.3 mmol/L — ABNORMAL HIGH (ref 3.5–5.1)
Potassium: 5.5 mmol/L — ABNORMAL HIGH (ref 3.5–5.1)
Potassium: 5.8 mmol/L — ABNORMAL HIGH (ref 3.5–5.1)
Potassium: 6.3 mmol/L (ref 3.5–5.1)
Potassium: 6.3 mmol/L (ref 3.5–5.1)
Sodium: 146 mmol/L — ABNORMAL HIGH (ref 135–145)
Sodium: 146 mmol/L — ABNORMAL HIGH (ref 135–145)
Sodium: 144 mmol/L (ref 135–145)
Sodium: 141 mmol/L (ref 135–145)
Sodium: 140 mmol/L (ref 135–145)
TCO2: 18 mmol/L — ABNORMAL LOW (ref 22–32)
TCO2: 18 mmol/L — ABNORMAL LOW (ref 22–32)
TCO2: 18 mmol/L — ABNORMAL LOW (ref 22–32)
TCO2: 19 mmol/L — ABNORMAL LOW (ref 22–32)
TCO2: 21 mmol/L — ABNORMAL LOW (ref 22–32)
pCO2 arterial: 37 mmHg (ref 32.0–48.0)
pCO2 arterial: 35 mmHg (ref 32.0–48.0)
pCO2 arterial: 42.1 mmHg (ref 32.0–48.0)
pCO2 arterial: 41.6 mmHg (ref 32.0–48.0)
pCO2 arterial: 44.9 mmHg (ref 32.0–48.0)
pH, Arterial: 7.268 — ABNORMAL LOW (ref 7.350–7.450)
pH, Arterial: 7.279 — ABNORMAL LOW (ref 7.350–7.450)
pH, Arterial: 7.195 — CL (ref 7.350–7.450)
pH, Arterial: 7.238 — ABNORMAL LOW (ref 7.350–7.450)
pH, Arterial: 7.244 — ABNORMAL LOW (ref 7.350–7.450)
pO2, Arterial: 57 mmHg — ABNORMAL LOW (ref 83.0–108.0)
pO2, Arterial: 48 mmHg — ABNORMAL LOW (ref 83.0–108.0)
pO2, Arterial: 68 mmHg — ABNORMAL LOW (ref 83.0–108.0)
pO2, Arterial: 83 mmHg (ref 83.0–108.0)
pO2, Arterial: 83 mmHg (ref 83.0–108.0)

## 2020-09-08 LAB — LACTIC ACID, PLASMA
Lactic Acid, Venous: 9.4 mmol/L (ref 0.5–1.9)
Lactic Acid, Venous: 4.1 mmol/L (ref 0.5–1.9)
Lactic Acid, Venous: 2.5 mmol/L (ref 0.5–1.9)

## 2020-09-08 LAB — BASIC METABOLIC PANEL
Anion gap: 15 (ref 5–15)
Anion gap: 10 (ref 5–15)
Anion gap: 11 (ref 5–15)
Anion gap: 11 (ref 5–15)
BUN: 39 mg/dL — ABNORMAL HIGH (ref 8–23)
BUN: 51 mg/dL — ABNORMAL HIGH (ref 8–23)
BUN: 60 mg/dL — ABNORMAL HIGH (ref 8–23)
BUN: 63 mg/dL — ABNORMAL HIGH (ref 8–23)
CO2: 16 mmol/L — ABNORMAL LOW (ref 22–32)
CO2: 19 mmol/L — ABNORMAL LOW (ref 22–32)
CO2: 18 mmol/L — ABNORMAL LOW (ref 22–32)
CO2: 19 mmol/L — ABNORMAL LOW (ref 22–32)
Calcium: 9.2 mg/dL (ref 8.9–10.3)
Calcium: 8.4 mg/dL — ABNORMAL LOW (ref 8.9–10.3)
Calcium: 8.3 mg/dL — ABNORMAL LOW (ref 8.9–10.3)
Calcium: 8 mg/dL — ABNORMAL LOW (ref 8.9–10.3)
Chloride: 113 mmol/L — ABNORMAL HIGH (ref 98–111)
Chloride: 112 mmol/L — ABNORMAL HIGH (ref 98–111)
Chloride: 109 mmol/L (ref 98–111)
Chloride: 106 mmol/L (ref 98–111)
Creatinine, Ser: 1.71 mg/dL — ABNORMAL HIGH (ref 0.44–1.00)
Creatinine, Ser: 1.92 mg/dL — ABNORMAL HIGH (ref 0.44–1.00)
Creatinine, Ser: 2.34 mg/dL — ABNORMAL HIGH (ref 0.44–1.00)
Creatinine, Ser: 2.06 mg/dL — ABNORMAL HIGH (ref 0.44–1.00)
GFR, Estimated: 32 mL/min — ABNORMAL LOW (ref >60)
GFR, Estimated: 28 mL/min — ABNORMAL LOW (ref >60)
GFR, Estimated: 22 mL/min — ABNORMAL LOW (ref >60)
GFR, Estimated: 26 mL/min — ABNORMAL LOW (ref >60)
Glucose, Bld: 139 mg/dL — ABNORMAL HIGH (ref 70–99)
Glucose, Bld: 310 mg/dL — ABNORMAL HIGH (ref 70–99)
Glucose, Bld: 375 mg/dL — ABNORMAL HIGH (ref 70–99)
Glucose, Bld: 350 mg/dL — ABNORMAL HIGH (ref 70–99)
Potassium: 6 mmol/L — ABNORMAL HIGH (ref 3.5–5.1)
Potassium: 6.2 mmol/L — ABNORMAL HIGH (ref 3.5–5.1)
Potassium: 6.4 mmol/L (ref 3.5–5.1)
Potassium: 5.4 mmol/L — ABNORMAL HIGH (ref 3.5–5.1)
Sodium: 144 mmol/L (ref 135–145)
Sodium: 141 mmol/L (ref 135–145)
Sodium: 138 mmol/L (ref 135–145)
Sodium: 136 mmol/L (ref 135–145)

## 2020-09-08 LAB — CBC
HCT: 24.1 % — ABNORMAL LOW (ref 36.0–46.0)
Hemoglobin: 7.8 g/dL — ABNORMAL LOW (ref 12.0–15.0)
MCH: 26.3 pg (ref 26.0–34.0)
MCHC: 32.4 g/dL (ref 30.0–36.0)
MCV: 81.1 fL (ref 80.0–100.0)
Platelets: 168 10*3/uL (ref 150–400)
RBC: 2.97 MIL/uL — ABNORMAL LOW (ref 3.87–5.11)
RDW: 16.2 % — ABNORMAL HIGH (ref 11.5–15.5)
WBC: 28.7 10*3/uL — ABNORMAL HIGH (ref 4.0–10.5)
nRBC: 2.5 % — ABNORMAL HIGH (ref 0.0–0.2)

## 2020-09-08 LAB — PROCALCITONIN: Procalcitonin: 7.57 ng/mL

## 2020-09-08 LAB — PREPARE RBC (CROSSMATCH)

## 2020-09-08 LAB — MRSA PCR SCREENING: MRSA by PCR: NEGATIVE

## 2020-09-08 LAB — TRIGLYCERIDES: Triglycerides: 292 mg/dL — ABNORMAL HIGH (ref <150)

## 2020-09-08 LAB — GLUCOSE, CAPILLARY
Glucose-Capillary: 123 mg/dL — ABNORMAL HIGH (ref 70–99)
Glucose-Capillary: 125 mg/dL — ABNORMAL HIGH (ref 70–99)
Glucose-Capillary: 272 mg/dL — ABNORMAL HIGH (ref 70–99)
Glucose-Capillary: 319 mg/dL — ABNORMAL HIGH (ref 70–99)
Glucose-Capillary: 322 mg/dL — ABNORMAL HIGH (ref 70–99)
Glucose-Capillary: 351 mg/dL — ABNORMAL HIGH (ref 70–99)

## 2020-09-08 LAB — BRAIN NATRIURETIC PEPTIDE: B Natriuretic Peptide: 432.5 pg/mL — ABNORMAL HIGH (ref 0.0–100.0)

## 2020-09-08 LAB — HEMOGLOBIN AND HEMATOCRIT, BLOOD
HCT: 23.6 % — ABNORMAL LOW (ref 36.0–46.0)
Hemoglobin: 7.8 g/dL — ABNORMAL LOW (ref 12.0–15.0)

## 2020-09-08 LAB — PATHOLOGIST SMEAR REVIEW

## 2020-09-08 LAB — PHOSPHORUS: Phosphorus: 8.6 mg/dL — ABNORMAL HIGH (ref 2.5–4.6)

## 2020-09-08 LAB — MAGNESIUM: Magnesium: 2.3 mg/dL (ref 1.7–2.4)

## 2020-09-08 MED ORDER — SODIUM CHLORIDE 0.9 % IV SOLN
2.0000 g | Freq: Two times a day (BID) | INTRAVENOUS | Status: DC
  Administered 2020-09-08 – 2020-09-11 (×7): 2 g via INTRAVENOUS
  Filled 2020-09-08 (×7): qty 2

## 2020-09-08 MED ORDER — PIVOT 1.5 CAL PO LIQD
1000.0000 mL | ORAL | Status: DC
  Administered 2020-09-08: 1000 mL
  Filled 2020-09-08 (×2): qty 1000

## 2020-09-08 MED ORDER — VANCOMYCIN VARIABLE DOSE PER UNSTABLE RENAL FUNCTION (PHARMACIST DOSING): Status: DC

## 2020-09-08 MED ORDER — FENTANYL 2500MCG IN NS 250ML (10MCG/ML) PREMIX INFUSION
25.0000 ug/h | INTRAVENOUS | Status: DC
  Administered 2020-09-08: 300 ug/h via INTRAVENOUS
  Administered 2020-09-08: 150 ug/h via INTRAVENOUS
  Administered 2020-09-08 – 2020-09-10 (×7): 300 ug/h via INTRAVENOUS
  Administered 2020-09-11: 250 ug/h via INTRAVENOUS
  Administered 2020-09-11: 200 ug/h via INTRAVENOUS
  Administered 2020-09-12: 150 ug/h via INTRAVENOUS
  Administered 2020-09-13: 125 ug/h via INTRAVENOUS
  Administered 2020-09-13: 75 ug/h via INTRAVENOUS
  Filled 2020-09-08 (×13): qty 250

## 2020-09-08 MED ORDER — ALBUMIN HUMAN 5 % IV SOLN
25.0000 g | Freq: Once | INTRAVENOUS | Status: AC
  Administered 2020-09-08: 25 g via INTRAVENOUS
  Filled 2020-09-08: qty 500

## 2020-09-08 MED ORDER — CISATRACURIUM BOLUS VIA INFUSION
5.0000 mg | Freq: Once | INTRAVENOUS | Status: AC
  Administered 2020-09-08: 5 mg via INTRAVENOUS
  Filled 2020-09-08: qty 5

## 2020-09-08 MED ORDER — CALCIUM GLUCONATE-NACL 1-0.675 GM/50ML-% IV SOLN
1.0000 g | Freq: Once | INTRAVENOUS | Status: AC
  Administered 2020-09-08: 1000 mg via INTRAVENOUS
  Filled 2020-09-08: qty 50

## 2020-09-08 MED ORDER — CHLORHEXIDINE GLUCONATE 0.12% ORAL RINSE (MEDLINE KIT)
15.0000 mL | Freq: Two times a day (BID) | OROMUCOSAL | Status: DC
  Administered 2020-09-08 – 2020-09-14 (×13): 15 mL via OROMUCOSAL

## 2020-09-08 MED ORDER — INSULIN ASPART 100 UNIT/ML IV SOLN
8.0000 [IU] | Freq: Once | INTRAVENOUS | Status: AC
  Administered 2020-09-08: 8 [IU] via INTRAVENOUS

## 2020-09-08 MED ORDER — SODIUM BICARBONATE 8.4 % IV SOLN
50.0000 meq | Freq: Once | INTRAVENOUS | Status: AC
  Administered 2020-09-08: 50 meq via INTRAVENOUS
  Filled 2020-09-08: qty 50

## 2020-09-08 MED ORDER — PROPOFOL 1000 MG/100ML IV EMUL
25.0000 ug/kg/min | INTRAVENOUS | Status: DC
  Administered 2020-09-08: 25 ug/kg/min via INTRAVENOUS
  Administered 2020-09-08: 45 ug/kg/min via INTRAVENOUS
  Filled 2020-09-08 (×2): qty 100

## 2020-09-08 MED ORDER — ORAL CARE MOUTH RINSE
15.0000 mL | OROMUCOSAL | Status: DC
  Administered 2020-09-08 – 2020-09-14 (×62): 15 mL via OROMUCOSAL

## 2020-09-08 MED ORDER — TRAVASOL 10 % IV SOLN
INTRAVENOUS | Status: DC
  Filled 2020-09-08: qty 1248

## 2020-09-08 MED ORDER — HEPARIN SODIUM (PORCINE) 1000 UNIT/ML DIALYSIS
1000.0000 [IU] | INTRAMUSCULAR | Status: DC | PRN
  Filled 2020-09-08: qty 6

## 2020-09-08 MED ORDER — PRISMASOL BGK 0/2.5 32-2.5 MEQ/L EC SOLN
Status: DC
  Filled 2020-09-08 (×8): qty 5000

## 2020-09-08 MED ORDER — MIDAZOLAM BOLUS VIA INFUSION
1.0000 mg | INTRAVENOUS | Status: DC | PRN
  Administered 2020-09-13: 1 mg via INTRAVENOUS
  Filled 2020-09-08: qty 2

## 2020-09-08 MED ORDER — INSULIN REGULAR(HUMAN) IN NACL 100-0.9 UT/100ML-% IV SOLN
INTRAVENOUS | Status: DC
  Administered 2020-09-08: 14 [IU]/h via INTRAVENOUS
  Administered 2020-09-09: 10 [IU]/h via INTRAVENOUS
  Filled 2020-09-08 (×2): qty 100

## 2020-09-08 MED ORDER — SODIUM BICARBONATE 8.4 % IV SOLN
INTRAVENOUS | Status: AC
  Filled 2020-09-08: qty 100

## 2020-09-08 MED ORDER — HEPARIN SODIUM (PORCINE) 1000 UNIT/ML IJ SOLN
2800.0000 [IU] | Freq: Once | INTRAMUSCULAR | Status: AC
  Administered 2020-09-08: 2800 [IU] via INTRAVENOUS
  Filled 2020-09-08: qty 3

## 2020-09-08 MED ORDER — VANCOMYCIN HCL 2000 MG/400ML IV SOLN
2000.0000 mg | Freq: Once | INTRAVENOUS | Status: AC
  Administered 2020-09-08: 2000 mg via INTRAVENOUS
  Filled 2020-09-08: qty 400

## 2020-09-08 MED ORDER — PRISMASOL BGK 0/2.5 32-2.5 MEQ/L EC SOLN
Status: DC
  Filled 2020-09-08 (×10): qty 5000

## 2020-09-08 MED ORDER — SODIUM CHLORIDE 0.9% IV SOLUTION: Freq: Once | INTRAVENOUS | Status: AC

## 2020-09-08 MED ORDER — PRISMASOL BGK 0/2.5 32-2.5 MEQ/L EC SOLN
Status: DC
  Filled 2020-09-08 (×13): qty 5000

## 2020-09-08 MED ORDER — SODIUM CHLORIDE 0.9 % IV SOLN
0.0000 ug/kg/min | INTRAVENOUS | Status: DC
  Administered 2020-09-08 (×2): 3 ug/kg/min via INTRAVENOUS
  Administered 2020-09-08: 10 ug/kg/min via INTRAVENOUS
  Administered 2020-09-08 – 2020-09-09 (×5): 3 ug/kg/min via INTRAVENOUS
  Filled 2020-09-08 (×6): qty 20

## 2020-09-08 MED ORDER — DEXTROSE 50 % IV SOLN: 0.0000 mL | INTRAVENOUS | Status: DC | PRN

## 2020-09-08 MED ORDER — SODIUM CHLORIDE 0.9 % IV BOLUS
1000.0000 mL | Freq: Once | INTRAVENOUS | Status: AC
  Administered 2020-09-08: 1000 mL via INTRAVENOUS

## 2020-09-08 MED ORDER — SODIUM BICARBONATE 8.4 % IV SOLN
100.0000 meq | Freq: Once | INTRAVENOUS | Status: AC
  Administered 2020-09-08: 100 meq via INTRAVENOUS

## 2020-09-08 MED ORDER — DEXTROSE 50 % IV SOLN
50.0000 mL | Freq: Once | INTRAVENOUS | Status: AC
  Administered 2020-09-08: 50 mL via INTRAVENOUS
  Filled 2020-09-08: qty 50

## 2020-09-08 MED ORDER — STERILE WATER FOR INJECTION IV SOLN
INTRAVENOUS | Status: DC
  Filled 2020-09-08 (×2): qty 850

## 2020-09-08 MED ORDER — ARTIFICIAL TEARS OPHTHALMIC OINT
1.0000 "application " | TOPICAL_OINTMENT | Freq: Three times a day (TID) | OPHTHALMIC | Status: DC
  Administered 2020-09-08 – 2020-09-14 (×18): 1 via OPHTHALMIC
  Filled 2020-09-08 (×3): qty 3.5

## 2020-09-08 MED ORDER — FENTANYL CITRATE (PF) 100 MCG/2ML IJ SOLN
25.0000 ug | Freq: Once | INTRAMUSCULAR | Status: AC
  Administered 2020-09-08: 25 ug via INTRAVENOUS

## 2020-09-08 MED ORDER — FENTANYL BOLUS VIA INFUSION
25.0000 ug | INTRAVENOUS | Status: DC | PRN
  Administered 2020-09-11 – 2020-09-13 (×6): 25 ug via INTRAVENOUS
  Filled 2020-09-08: qty 25

## 2020-09-08 MED ORDER — LACTATED RINGERS IV BOLUS
500.0000 mL | Freq: Once | INTRAVENOUS | Status: AC
  Administered 2020-09-08: 500 mL via INTRAVENOUS

## 2020-09-08 MED ORDER — MIDAZOLAM 50MG/50ML (1MG/ML) PREMIX INFUSION
0.0000 mg/h | INTRAVENOUS | Status: DC
  Administered 2020-09-08 – 2020-09-10 (×13): 10 mg/h via INTRAVENOUS
  Administered 2020-09-11: 2 mg/h via INTRAVENOUS
  Administered 2020-09-11: 10 mg/h via INTRAVENOUS
  Administered 2020-09-11: 8 mg/h via INTRAVENOUS
  Filled 2020-09-08 (×17): qty 50

## 2020-09-08 NOTE — Progress Notes (Signed)
eLink Physician-Brief Progress Note Patient Name: Candace [name] DOB: May 28, 1951 MRN: [id number]   Date of Service  09/08/2020  HPI/Events of Note  Patient with oliguria.  eICU Interventions  LR 500 ml fluid bolus, stat H & H.        Kerry Kass Cleophas Yoak 09/08/2020, 1:51 AM

## 2020-09-08 NOTE — Progress Notes (Signed)
Supined patient assisted RNs

## 2020-09-08 NOTE — Progress Notes (Signed)
Per Dr. Lucile Shutters I paged nephrology to confirm they want 2K prismasol bags and not 0 K as patient's potassium level is 6.4.

## 2020-09-08 NOTE — Progress Notes (Signed)
Notified E-link unable to insert foley catheter, attempted by two RN's unsuccessful.

## 2020-09-08 NOTE — Progress Notes (Signed)
   09/07/20 1920  Hand-Off documentation  Handoff Given Given to shift RN/LPN  Report given to (Full Name) Donna Christen RN  Handoff Received Received from shift RN/LPN  Report received from (Full Name) Jerelyn Trimarco RN  pt transferred to [facility]

## 2020-09-08 NOTE — Procedures (Signed)
Central Venous Catheter Insertion Procedure Note  Candace [name]  [id number]  [birth year]  Date:09/08/20  Time:4:06 PM   Provider Performing:Pete Johnette Abraham Kary Kos   Procedure: Insertion of Non-tunneled Central Venous Catheter(36556)with US guidance ([id number])    Indication(s) Hemodialysis  Consent Risks of the procedure as well as the alternatives and risks of each were explained to the patient and/or caregiver.  Consent for the procedure was obtained and is signed in the bedside chart  Anesthesia Topical only with 1% lidocaine   Timeout Verified patient identification, verified procedure, site/side was marked, verified correct patient position, special equipment/implants available, medications/allergies/relevant history reviewed, required imaging and test results available.  Sterile Technique Maximal sterile technique including full sterile barrier drape, hand hygiene, sterile gown, sterile gloves, mask, hair covering, sterile ultrasound probe cover (if used).  Procedure Description Area of catheter insertion was cleaned with chlorhexidine and draped in sterile fashion.   With real-time ultrasound guidance a HD catheter was placed into the right internal jugular vein.  Nonpulsatile blood flow and easy flushing noted in all ports.  The catheter was sutured in place and sterile dressing applied.  Complications/Tolerance None; patient tolerated the procedure well. Chest X-ray is ordered to verify placement for internal jugular or subclavian cannulation.  Chest x-ray is not ordered for femoral cannulation.  EBL Minimal  Specimen(s) None  Erick Colace ACNP-BC [place] Pager # [phone] OR # [phone] if no answer

## 2020-09-08 NOTE — Progress Notes (Signed)
Per report titrating pressors to maintain SBP>110, called e-link to confirm that plan. Also notified e-link blood glucose>300. Awaiting confirmation on BP goal and new orders for BG.

## 2020-09-08 NOTE — Progress Notes (Signed)
Speech Language Pathology Discharge Patient Details Name: Candace [name] MRN: [id number] DOB: [birth year] Today's Date: 09/08/2020 Time:  -     Patient discharged from SLP services secondary to medical decline - will need to re-order SLP to resume therapy services. Vent now  Please see latest therapy progress note for current level of functioning and progress toward goals.    Progress and discharge plan discussed with patient and/or caregiver: Patient unable to participate in discharge planning and no caregivers available  GO     [name] 09/08/2020, 11:19 AM   [name] Caroli.Ed Risk analyst [phone] Office [phone]

## 2020-09-08 NOTE — Progress Notes (Signed)
Notified Paul CCM PA that patient is still satting 80-82% with Arterial BP MAP 58-60. At this time he says wait for scheduled ABG to confirm  Oxygen level.

## 2020-09-08 NOTE — Progress Notes (Signed)
PHARMACY - TOTAL PARENTERAL NUTRITION CONSULT NOTE  Indication:  Intolerance to EN  Patient Measurements: Height: 5\' 8"  (172.7 cm) Weight: 99.7 kg (219 lb 12.8 oz) IBW/kg (Calculated) : 63.9 TPN AdjBW (KG): 72.9 Body mass index is 33.42 kg/m.  Assessment:  49 YOF recently admitted (10/25 > 10/31) with Covid-19 and discharged home on supplemental oxygen and on Eliquis for elevated d-dimer. She returned to the ED for elevated heart rate, found to be in Afib RVR.  She developed abdominal pain that progressively got worse and CT shows bowel perforation, pneumoperitoneum and intermediate density free fluid in the abdomen. Of note, patient has a prior h/o partial colectomy/colostomy for colon cancer, completion colectomy and ileostomy. S/pex-lap with LoA, modified Graham patch repair of perforated duodenal ulceron 09/01/20.  Pharmacy consulted to manage TPN due to baseline malnutrition and likely prolonged inability to tolerate PO feeds.   Patient transferred to ICU on 11/9 and required intubation also with concern of bleed. Patient is currently sedated on cisatracurium, fentanyl, and propofol. Plan per CCM to change propofol to midazolam. Patient is also currently requiring vasopressor support with norepinephrine and vasopressin. Scr has increased and UOP decreased with possible need for CRRT pending clinical course. Patient also started on empiric antibiotics for concern of PNA.   Glucose / Insulin: hx DM . CBGs ranging 355 - 139, recently improved. CBGs elevated from steroid, TPN, and previously on D5W (SM dose reduced 11/5, 11/6, and last dose of 10 mg x 1 on 11/7 and started hydrocortisone stress dose on 11/9). Required 59 units rSSI + Levemir20 units BID Electrolytes: Na 144 (no Na in TPN). Cl 113. K 6.0. Phos 8.6. Corrected Ca 11.  Renal: Scr increased to 1.71. BUN increased to 39.  LFTs / TGs: AST/ALT up 59/56, tbili WNL, TG elevated at 292 Prealbumin / albumin: prealbumin WNL at 26.3,  albumin 1.7 Intake / Output; MIVF: UOP 0.3 ml/kg/hr, drain 15 ml, stool 44mL, net -5.1L. Off D5W. GI Imaging:  - 11/9 abd xray: no bowel obstruction Surgeries / Procedures: none since TPN  Central access: triple lumen PICC placed 09/03/20 TPN start date: 09/03/20  Nutritional Goals (per RD on 11/5): kCal: 2200-2450, Protein: 115-130gms , Fluid >/= 2L/day Goal TPN rate = 90 ml/hr (57 g/L AA, 16% CHO and 63 g/L ILE) = 123g AA and 2294 kCal  Current Nutrition:  TPN  Plan:  Continue TPN at 80 mL/hr this will provide 125g AA, 150g CHO and 48g ILE for a total of 1488 kCal, meeting 67% of minimal kCal and 100% of protein needs (ILE 20% of kCal d/t elevated TG; on 11/9 TPN adjusted to meet 100% of protein needs and CHO remained same d/t elevated CBGs)  Given decline in renal function and possible need for CRRT depending on clinical course will continue current TPN with removal of electrolytes and readdress kcal goals with RD and electrolyte needs tomorrow as needed  Electrolytes in TPN: all electrolytes removed in TPN Add standard MVI and trace elements to TPN  Continue resistant SSI Q4H + Levemir 20 units SQ BID.  MD managing outside of TPN.    Cristela Felt, PharmD Clinical Pharmacist  09/08/2020, 7:26 AM

## 2020-09-08 NOTE — Progress Notes (Signed)
Patient placed in prone position. Per Dr. Earlie Server placed pillow beneath hips and beneath chest to reduce pressure on abdomen. Rass -5, BIS 38-40.

## 2020-09-08 NOTE — Progress Notes (Signed)
Progress Note  Patient Name: Candace [name] Date of Encounter: 09/08/2020  Primary Cardiologist:   No primary care provider on file.   Subjective   Progressive hemorrhagic shock requiring intubated, transfusion and pressors.    Inpatient Medications    Scheduled Meds: . artificial tears  1 application Both Eyes E7U  . chlorhexidine gluconate (MEDLINE KIT)  15 mL Mouth Rinse BID  . Chlorhexidine Gluconate Cloth  6 each Topical Daily  . docusate  100 mg Per Tube BID  . hydrocortisone sodium succinate  50 mg Intravenous Q6H  . insulin aspart  0-20 Units Subcutaneous Q4H  . insulin detemir  25 Units Subcutaneous BID  . mouth rinse  15 mL Mouth Rinse 10 times per day  . pantoprazole (PROTONIX) IV  40 mg Intravenous Q12H  . polyethylene glycol  17 g Per Tube Daily  . vancomycin variable dose per unstable renal function (pharmacist dosing)   Does not apply See admin instructions   Continuous Infusions: . calcium gluconate 1,000 mg (09/08/20 1016)  . ceFEPime (MAXIPIME) IV 2 g (09/08/20 1015)  . cisatracurium (NIMBEX) infusion 2 mg/mL 3 mcg/kg/min (09/08/20 0600)  . fentaNYL infusion INTRAVENOUS 300 mcg/hr (09/08/20 1023)  . midazolam 10 mg/hr (09/08/20 1022)  . norepinephrine (LEVOPHED) Adult infusion 40 mcg/min (09/08/20 0655)  . TPN ADULT (ION) 80 mL/hr at 09/08/20 0600  . TPN ADULT (ION)    . vancomycin 2,000 mg (09/08/20 1017)  . vasopressin 0.03 Units/min (09/08/20 0600)   PRN Meds: acetaminophen, albuterol, diphenhydrAMINE, fentaNYL, metoprolol tartrate, midazolam, phenol, silver nitrate applicators, sodium chloride   Vital Signs    Vitals:   09/08/20 0700 09/08/20 0730 09/08/20 0800 09/08/20 0810  BP: (!) 97/37 (!) 103/37 (!) 103/41   Pulse: 63 65 68 68  Resp: 20 20 20 20   Temp: (!) 97.5 F (36.4 C) 97.9 F (36.6 C) 98.2 F (36.8 C)   TempSrc:   Bladder   SpO2: 97% 97% 97% 98%  Weight:      Height:        Intake/Output Summary (Last 24 hours) at 09/08/2020  1046 Last data filed at 09/08/2020 0600 Gross per 24 hour  Intake 3810.38 ml  Output 645 ml  Net 3165.38 ml   Filed Weights   08/31/20 2115  Weight: 99.7 kg    Telemetry    NSR - Personally Reviewed  ECG    NA - Personally Reviewed  Physical Exam   GEN:   Critically ill and proned on the vent Cardiac: RRR, unable to assess with patient proned Respiratory:    Diffuse coarse crackles GI:   Unable to assess MS:   Diffuse non pitting edema Neuro:  Intubated and sedated.   Labs    Chemistry Recent Labs  Lab 09/05/20 0355 09/05/20 0355 09/06/20 0320 09/06/20 0320 09/07/20 0118 09/07/20 0118 09/07/20 1625 09/07/20 2159 09/08/20 0422 09/08/20 0446 09/08/20 1007  NA 153*   < > 150*   < > 146*   < > 141   < > 144 144 141  K 4.0   < > 4.7   < > 4.3   < > 4.9   < > 5.8* 6.0* 6.3*  CL 119*   < > 118*   < > 115*  --  111  --   --  113*  --   CO2 27   < > 26   < > 25  --  19*  --   --  16*  --  GLUCOSE 261*   < > 255*   < > 263*  --  355*  --   --  139*  --   BUN 37*   < > 32*   < > 26*  --  30*  --   --  39*  --   CREATININE 1.02*   < > 0.86   < > 0.75  --  0.93  --   --  1.71*  --   CALCIUM 9.4   < > 9.4   < > 9.7  --  9.8  --   --  9.2  --   PROT 5.0*  --  5.1*  --  4.9*  --   --   --   --   --   --   ALBUMIN 1.8*  --  1.8*  --  1.7*  --   --   --   --   --   --   AST 35  --  60*  --  59*  --   --   --   --   --   --   ALT 22  --  43  --  56*  --   --   --   --   --   --   ALKPHOS 91  --  114  --  85  --   --   --   --   --   --   BILITOT 0.4  --  0.1*  --  0.6  --   --   --   --   --   --   GFRNONAA 60*   < > >60   < > >60  --  >60  --   --  32*  --   ANIONGAP 7   < > 6   < > 6  --  11  --   --  15  --    < > = values in this interval not displayed.     Hematology Recent Labs  Lab 09/07/20 1625 09/07/20 2159 09/07/20 2202 09/08/20 0148 09/08/20 0422 09/08/20 0446 09/08/20 1007  WBC 20.2*  --  34.5*  --   --  28.7*  --   RBC 2.98*  --  3.47*  --   --   2.97*  --   HGB 6.9*   < > 8.8*   < > 6.8* 7.8* 7.8*  HCT 22.9*   < > 28.3*   < > 20.0* 24.1* 23.0*  MCV 76.8*  --  81.6  --   --  81.1  --   MCH 23.2*  --  25.4*  --   --  26.3  --   MCHC 30.1  --  31.1  --   --  32.4  --   RDW 15.5  --  16.8*  --   --  16.2*  --   PLT 216  --  201  --   --  168  --    < > = values in this interval not displayed.    Cardiac EnzymesNo results for input(s): TROPONINI in the last 168 hours. No results for input(s): TROPIPOC in the last 168 hours.   BNP Recent Labs  Lab 09/06/20 0320 09/07/20 0118 09/08/20 0446  BNP 316.8* 216.8* 432.5*     DDimer  Recent Labs  Lab 09/04/20 0514 09/05/20 0355 09/06/20 0320  DDIMER 3.64* 1.89* 1.43*     Radiology  DG Abd 1 View  Result Date: 09/07/2020 CLINICAL DATA:  Nausea EXAM: ABDOMEN - 1 VIEW COMPARISON:  September 06, 2020 FINDINGS: Drain again noted in lateral right abdomen. No bowel dilatation or air-fluid level to suggest bowel obstruction. No free air. Probable phleboliths in the pelvis. Nasogastric tube no longer present. IMPRESSION: No bowel obstruction or free air evident on supine examination. Drain in lateral right abdomen again noted. Electronically Signed   By: Lowella Grip III M.D.   On: 09/07/2020 17:28   DG Chest Port 1 View  Result Date: 09/08/2020 CLINICAL DATA:  ARDS. EXAM: PORTABLE CHEST 1 VIEW COMPARISON:  September 07, 2020. FINDINGS: Stable cardiomediastinal silhouette. Endotracheal and nasogastric tubes are in good position. Left subclavian catheter is unchanged. Right-sided PICC line is unchanged. Stable bilateral lung opacities are noted consistent multifocal pneumonia. No pneumothorax is noted. Small bilateral pleural effusions are noted. Bony thorax is unremarkable. IMPRESSION: Stable bilateral lung opacities are noted consistent with multifocal pneumonia. Small bilateral pleural effusions are noted. Electronically Signed   By: Marijo Conception M.D.   On: 09/08/2020 08:01   DG  CHEST PORT 1 VIEW  Result Date: 09/07/2020 CLINICAL DATA:  COVID-19 positive, pneumonia, intubated EXAM: PORTABLE CHEST 1 VIEW COMPARISON:  09/07/2020 at 5:08 p.m. FINDINGS: Single frontal view of the chest demonstrates endotracheal tube overlying tracheal air column, tip midway between thoracic inlet and carina. Enteric catheter passes below diaphragm tip excluded by collimation. Right-sided PICC and left subclavian central venous catheter projects over the superior vena cava. Cardiac silhouette is stable. There is progressive multifocal basilar predominant airspace disease consistent with pneumonia. No effusion or pneumothorax. IMPRESSION: 1. Progressive bibasilar airspace disease consistent with multifocal pneumonia. 2. Support devices as above. Electronically Signed   By: Randa Ngo M.D.   On: 09/07/2020 22:37   DG Chest Port 1 View  Result Date: 09/07/2020 CLINICAL DATA:  Nausea with cough and shortness of breath. Reported recent COVID-19 positive EXAM: PORTABLE CHEST 1 VIEW COMPARISON:  September 06, 2020. FINDINGS: Central catheter tip is in the superior vena cava. Nasogastric tube no longer present. Airspace opacity is noted in the mid and lower lung regions, similar to 1 day prior. Heart is upper normal in size with pulmonary vascularity normal. No adenopathy. There is aortic atherosclerosis. Arthropathy in the left shoulder again noted. IMPRESSION: Multifocal airspace opacity consistent with multifocal pneumonia. Stable cardiac silhouette. No adenopathy evident. Aortic Atherosclerosis (ICD10-I70.0). Electronically Signed   By: Lowella Grip III M.D.   On: 09/07/2020 17:27   DG Abd Portable 1V  Result Date: 09/07/2020 CLINICAL DATA:  Enteric catheter placement EXAM: PORTABLE ABDOMEN - 1 VIEW COMPARISON:  09/07/2020 at 5:11 p.m. FINDINGS: Frontal view of the abdomen demonstrates surgical drain overlying the right mid abdomen. Enteric catheter coiled back upon itself, tip in the region of the  gastric antrum. Bowel gas pattern is unremarkable. IMPRESSION: 1. Enteric catheter overlying gastric antrum. Electronically Signed   By: Randa Ngo M.D.   On: 09/07/2020 22:40   DG UGI W SINGLE CM (SOL OR THIN BA)  Result Date: 09/06/2020 CLINICAL DATA:  69 year old female with history of perforated duodenal ulcer. COVID-19 infection. EXAM: UPPER GI SERIES WITH KUB TECHNIQUE: After obtaining a scout radiograph a limited upper GI series was performed after injection of the indwelling nasogastric tube with 80 mL of Omnipaque 300. FLUOROSCOPY TIME:  Fluoroscopy Time:  1 minutes and 36 seconds. Radiation Exposure Index (if provided by the fluoroscopic device): 27.6 mGy Number of Acquired Spot  Images: No priors. COMPARISON:  None. FINDINGS: Preprocedural KUB demonstrates a nasogastric tube extending into the distal body of the stomach. Surgical drain extending into the right-side of the abdomen. Right-sided colostomy. Real-time imaging was performed during infusion of the indwelling nasogastric tube with 80 mL of Omnipaque 300. At no time during the examination was extravasation of contrast material observed. Specifically, no definite extravasation was identified in the region of the duodenum. IMPRESSION: 1. Limited examination demonstrating no evidence of persistent perforation in the duodenum. Electronically Signed   By: Vinnie Langton M.D.   On: 09/06/2020 16:30    Cardiac Studies   ECHO:  1. Left ventricular ejection fraction, by estimation, is 70 to 75%. The  left ventricle has hyperdynamic function. Left ventricular endocardial  border not optimally defined to evaluate regional wall motion, but appears  grossly normal. There is mild left  ventricular hypertrophy.  2. RV is not well visualized due to image quality. Right ventricular  systolic function is mildly reduced. The right ventricular size is mildly  enlarged. TR signal inadequate for assessing RVSP.  3. The mitral valve is normal  in structure. Trivial mitral valve  regurgitation. No evidence of mitral stenosis.  4. The aortic valve is tricuspid. Aortic valve regurgitation is not  visualized. No aortic stenosis is present.  5. The inferior vena cava is normal in size with greater than 50%  respiratory variability, suggesting right atrial pressure of 3 mmHg.   Patient Profile     69 y.o. female  with a hx of recent covid pna, obesity, colon cancer s/p colon resection with colostomy bag, HLD, NIDDM2, CKD3 presented to ED after her follow-up nurse advised her to present to ED 11/02 for elevated HR. She is being seen for the evaluation of Atrial fib at the request of Dr Candiss Norse.  Assessment & Plan    SHOCK: Hemorraghic.  Also with progressive COVID pneumonia.    ACUTE RESPIRATORY FAILURE:  Primarily realted to pneumonia.  BNP is mildly elevated as you might see with diastolic dysfunction and tachycardia.    There is some cephalization on her CXR indicating likely some fluid.    Would gently diurese as BP allows.   ATRIAL FIB:  We were called to help with this but she has been maintaining NSR.    No further cardiac suggestions.  Please call us if there are further questions.    For questions or updates, please contact [place] Please consult [email or web address] for contact info under Cardiology/STEMI.   Signed, Minus Breeding, MD  09/08/2020, 10:46 AM

## 2020-09-08 NOTE — Progress Notes (Signed)
[facility] Progress Note  7 Days Post-Op  Subjective: CC-  Patient on the vent, prone. On levo 45mcg/min and and vaso 0.03u/min. Oliguric, Cr up 1.71, K 6.3. Hgb 7.8<<7.8<<6.8 after 3 units PRBCs. Lactic acid 9.4<<9.9, repeat pending.  Objective: Vital signs in last 24 hours: Temp:  [78.8 F (26 C)-98.6 F (37 C)] 98.2 F (36.8 C) (11/10 0800) Pulse Rate:  [60-110] 68 (11/10 0810) Resp:  [11-60] 20 (11/10 0810) BP: (34-129)/(14-80) 103/41 (11/10 0800) SpO2:  [78 %-98 %] 98 % (11/10 0810) Arterial Line BP: (86-136)/(35-48) 136/47 (11/10 0800) FiO2 (%):  [35 %-100 %] 90 % (11/10 0931) Last BM Date:  (colcostomy bag)  Intake/Output from previous day: 11/09 0701 - 11/10 0700 In: 3810.4 [I.V.:2850.7; Blood:450; IV Piggyback:509.7] Out: 191 [Urine:600; Drains:15; Stool:30] Intake/Output this shift: No intake/output data recorded.  PE: Gen:  Sedated on the vent, prone HEENT: OG in place Card:  RRR, feet WWP bilaterally Pulm:  Mechanically ventilated Abd: unable to assess very well since she is prone but from lateral aspect appears soft, JP with minimal serosanguinous drainage (15cc/24hr) Ext:  calves soft  Lab Results:  Recent Labs    09/07/20 2202 09/08/20 0148 09/08/20 0446 09/08/20 1007  WBC 34.5*  --  28.7*  --   HGB 8.8*   < > 7.8* 7.8*  HCT 28.3*   < > 24.1* 23.0*  PLT 201  --  168  --    < > = values in this interval not displayed.   BMET Recent Labs    09/07/20 1625 09/07/20 2159 09/08/20 0446 09/08/20 1007  NA 141   < > 144 141  K 4.9   < > 6.0* 6.3*  CL 111  --  113*  --   CO2 19*  --  16*  --   GLUCOSE 355*  --  139*  --   BUN 30*  --  39*  --   CREATININE 0.93  --  1.71*  --   CALCIUM 9.8  --  9.2  --    < > = values in this interval not displayed.   PT/INR No results for input(s): LABPROT, INR in the last 72 hours. CMP     Component Value Date/Time   NA 141 09/08/2020 1007   K 6.3 (HH) 09/08/2020 1007   CL 113 (H)  09/08/2020 0446   CO2 16 (L) 09/08/2020 0446   GLUCOSE 139 (H) 09/08/2020 0446   BUN 39 (H) 09/08/2020 0446   CREATININE 1.71 (H) 09/08/2020 0446   CALCIUM 9.2 09/08/2020 0446   PROT 4.9 (L) 09/07/2020 0118   ALBUMIN 1.7 (L) 09/07/2020 0118   AST 59 (H) 09/07/2020 0118   ALT 56 (H) 09/07/2020 0118   ALKPHOS 85 09/07/2020 0118   BILITOT 0.6 09/07/2020 0118   GFRNONAA 32 (L) 09/08/2020 0446   Lipase     Component Value Date/Time   LIPASE 48 08/31/2020 2054       Studies/Results: DG Abd 1 View  Result Date: 09/07/2020 CLINICAL DATA:  Nausea EXAM: ABDOMEN - 1 VIEW COMPARISON:  September 06, 2020 FINDINGS: Drain again noted in lateral right abdomen. No bowel dilatation or air-fluid level to suggest bowel obstruction. No free air. Probable phleboliths in the pelvis. Nasogastric tube no longer present. IMPRESSION: No bowel obstruction or free air evident on supine examination. Drain in lateral right abdomen again noted. Electronically Signed   By: Lowella Grip III M.D.   On: 09/07/2020 17:28   DG Chest  Port 1 View  Result Date: 09/08/2020 CLINICAL DATA:  ARDS. EXAM: PORTABLE CHEST 1 VIEW COMPARISON:  September 07, 2020. FINDINGS: Stable cardiomediastinal silhouette. Endotracheal and nasogastric tubes are in good position. Left subclavian catheter is unchanged. Right-sided PICC line is unchanged. Stable bilateral lung opacities are noted consistent multifocal pneumonia. No pneumothorax is noted. Small bilateral pleural effusions are noted. Bony thorax is unremarkable. IMPRESSION: Stable bilateral lung opacities are noted consistent with multifocal pneumonia. Small bilateral pleural effusions are noted. Electronically Signed   By: Marijo Conception M.D.   On: 09/08/2020 08:01   DG CHEST PORT 1 VIEW  Result Date: 09/07/2020 CLINICAL DATA:  COVID-19 positive, pneumonia, intubated EXAM: PORTABLE CHEST 1 VIEW COMPARISON:  09/07/2020 at 5:08 p.m. FINDINGS: Single frontal view of the chest  demonstrates endotracheal tube overlying tracheal air column, tip midway between thoracic inlet and carina. Enteric catheter passes below diaphragm tip excluded by collimation. Right-sided PICC and left subclavian central venous catheter projects over the superior vena cava. Cardiac silhouette is stable. There is progressive multifocal basilar predominant airspace disease consistent with pneumonia. No effusion or pneumothorax. IMPRESSION: 1. Progressive bibasilar airspace disease consistent with multifocal pneumonia. 2. Support devices as above. Electronically Signed   By: Randa Ngo M.D.   On: 09/07/2020 22:37   DG Chest Port 1 View  Result Date: 09/07/2020 CLINICAL DATA:  Nausea with cough and shortness of breath. Reported recent COVID-19 positive EXAM: PORTABLE CHEST 1 VIEW COMPARISON:  September 06, 2020. FINDINGS: Central catheter tip is in the superior vena cava. Nasogastric tube no longer present. Airspace opacity is noted in the mid and lower lung regions, similar to 1 day prior. Heart is upper normal in size with pulmonary vascularity normal. No adenopathy. There is aortic atherosclerosis. Arthropathy in the left shoulder again noted. IMPRESSION: Multifocal airspace opacity consistent with multifocal pneumonia. Stable cardiac silhouette. No adenopathy evident. Aortic Atherosclerosis (ICD10-I70.0). Electronically Signed   By: Lowella Grip III M.D.   On: 09/07/2020 17:27   DG Abd Portable 1V  Result Date: 09/07/2020 CLINICAL DATA:  Enteric catheter placement EXAM: PORTABLE ABDOMEN - 1 VIEW COMPARISON:  09/07/2020 at 5:11 p.m. FINDINGS: Frontal view of the abdomen demonstrates surgical drain overlying the right mid abdomen. Enteric catheter coiled back upon itself, tip in the region of the gastric antrum. Bowel gas pattern is unremarkable. IMPRESSION: 1. Enteric catheter overlying gastric antrum. Electronically Signed   By: Randa Ngo M.D.   On: 09/07/2020 22:40   DG UGI W SINGLE CM (SOL  OR THIN BA)  Result Date: 09/06/2020 CLINICAL DATA:  69 year old female with history of perforated duodenal ulcer. COVID-19 infection. EXAM: UPPER GI SERIES WITH KUB TECHNIQUE: After obtaining a scout radiograph a limited upper GI series was performed after injection of the indwelling nasogastric tube with 80 mL of Omnipaque 300. FLUOROSCOPY TIME:  Fluoroscopy Time:  1 minutes and 36 seconds. Radiation Exposure Index (if provided by the fluoroscopic device): 27.6 mGy Number of Acquired Spot Images: No priors. COMPARISON:  None. FINDINGS: Preprocedural KUB demonstrates a nasogastric tube extending into the distal body of the stomach. Surgical drain extending into the right-side of the abdomen. Right-sided colostomy. Real-time imaging was performed during infusion of the indwelling nasogastric tube with 80 mL of Omnipaque 300. At no time during the examination was extravasation of contrast material observed. Specifically, no definite extravasation was identified in the region of the duodenum. IMPRESSION: 1. Limited examination demonstrating no evidence of persistent perforation in the duodenum. Electronically Signed  By: Vinnie Langton M.D.   On: 09/06/2020 16:30    Anti-infectives: Anti-infectives (From admission, onward)   Start     Dose/Rate Route Frequency Ordered Stop   09/08/20 1000  vancomycin (VANCOREADY) IVPB 2000 mg/400 mL        2,000 mg 200 mL/hr over 120 Minutes Intravenous  Once 09/08/20 0857     09/08/20 1000  ceFEPIme (MAXIPIME) 2 g in sodium chloride 0.9 % 100 mL IVPB        2 g 200 mL/hr over 30 Minutes Intravenous Every 12 hours 09/08/20 0857     09/08/20 0856  vancomycin variable dose per unstable renal function (pharmacist dosing)         Does not apply See admin instructions 09/08/20 0857     09/02/20 1600  cefTRIAXone (ROCEPHIN) 1 g in sodium chloride 0.9 % 100 mL IVPB  Status:  Discontinued        1 g 200 mL/hr over 30 Minutes Intravenous Every 24 hours 09/01/20 1811  09/02/20 0838   09/01/20 1800  fluconazole (DIFLUCAN) IVPB 400 mg        400 mg 50 mL/hr over 240 Minutes Intravenous  Once 09/01/20 1749 09/02/20 0603   09/01/20 1530  piperacillin-tazobactam (ZOSYN) IVPB 3.375 g        3.375 g 12.5 mL/hr over 240 Minutes Intravenous Every 8 hours 09/01/20 1514 09/05/20 2111   09/01/20 1000  levofloxacin (LEVAQUIN) tablet 250 mg  Status:  Discontinued        250 mg Oral Daily 08/31/20 1508 08/31/20 1735   08/31/20 1730  cefTRIAXone (ROCEPHIN) 1 g in sodium chloride 0.9 % 100 mL IVPB  Status:  Discontinued        1 g 200 mL/hr over 30 Minutes Intravenous Every 24 hours 08/31/20 1726 09/01/20 1513   08/31/20 1730  azithromycin (ZITHROMAX) 500 mg in sodium chloride 0.9 % 250 mL IVPB  Status:  Discontinued        500 mg 250 mL/hr over 60 Minutes Intravenous Every 24 hours 08/31/20 1726 09/02/20 0838       Assessment/Plan HTN HLD DM Hx colon cancer s/p partial colectomy/colostomy followed by completion colectomy and ileostomy Hypoxia requiring 3L O2 Covid-19 pneumonia  Elevated D dimer on eliquis -eliquis reversed with kcentra, hold eliquis A fib RVR Anemia 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 Bobbye Morton -POD#7 - continue PPI BID -completed4 days IV zosyn - UGI negative for leak 11/8 - continue JP drain and monitor output  - BID wet to dry dressing changes to midline abdominal wound  Acute respiratory failure Recent Covid pneumonia Shock Acute blood loss anemia - Intubated last night, now on 2 pressors. Appreciate CCM assistance. If stable she is going to go down for CT chest, abdomen, pelvis. Will follow up on this. She has been on blood thinners since POD#1 so it would be less likely for her to have a PE/DVT, but she is also hypercoagulable from  covid. Discussed with Dr. Elsworth Soho: unable to give contrast for CTA, and V/Q scan likely not beneficial in the setting of pneumonia. Checking BLE u/s.  ID -zosyn 11/3>>11/8. Maxipime/vancomycin 11/10>> VTE -on hold due to bleeding FEN -OG, TPN Foley -replaced 11/9 Follow up -Dr. Chrystie Nose - daughter [name] [phone]    LOS: 7 days    Wellington Hampshire, [facility] 09/08/2020, 10:11 AM Please  see Amion for pager number during day hours 7:00am-4:30pm

## 2020-09-08 NOTE — Progress Notes (Signed)
Assisted tele visit to patient with family member.  Ahmed Inniss McEachran, RN  

## 2020-09-08 NOTE — Progress Notes (Signed)
Patient proned at 2330 with RT and 4 RN's. No complications.

## 2020-09-08 NOTE — Progress Notes (Addendum)
NAMEDarcel [name], MRN:  [id number], DOB:  03/29/1951, LOS: 7 ADMISSION DATE:  08/31/2020, CONSULTATION DATE:  09/07/2020 REFERRING MD:  Dr Candiss Norse, CHIEF COMPLAINT:  Acute resp failure  Brief History   69 year old female who was previously diagnosed with Covid 08/23/2020.  Admitted 11/2 with AF-RVR, found to have a perforated duodenal underwent exploratory laparotomy with Phillip Heal patch placement.  History of present illness   69 year old black female that presented to the emergency room on 08/31/2020 for worsening of hypoxia and atrial fibrillation with RVR.  She was admitted to medical floor and placed on Cardizem infusion as well as Eliquis.  She then developed abdominal pain and was found to have a perforated duodenal ulcer for which she went to the operating room and had exploratory laparotomy, lysis of adhesions, Graham patch placement.  Onpostop day 6,developed bleeding from the surgical site.  Reported to a saturated dressing.  Patient was noted to have a hemoglobin change from 9-6.9.  During the same period time she had waxing waning blood pressure.  Developed hemorrhagic shock requiring pressors, blood transfusion and intubation  Past Medical History   Covid pneumonia Atrial fibrillation CKD stage III Diabetes mellitus Hypertension Colon cancer Hyperlipidemia  Significant Hospital Events   Atrial fibrillation with RVR Perforated duodenal ulcer  Consults:  Cardiology  Procedures:  11/3 Exploratory laparotomy, Phillip Heal patch, lysis of adhesion for duodenal ulceration postop day 6  A line 11/9 >> ETT 11/9 >> Lt Candace [name] CVL 11/9 >>   Micro Data:  resp 11/10 >> Blood 11/10 >>  Antimicrobials:  Cefepime 11/10 >> vanc 11/10 >>    SUBJ -critically ill, intubated  Required high-dose Levophed and vasopressin. Urine output tapered off over the last 12 hours. Sedated with propofol and fentanyl, paralyzed with Nimbex and proned for severe hypoxia. Hypothermic requiring  warmer    Objective   Blood pressure (!) 103/41, pulse 68, temperature 98.2 F (36.8 C), temperature source Bladder, resp. rate 20, height 5\' 8"  (1.727 m), weight 99.7 kg, SpO2 98 %.    Vent Mode: PRVC FiO2 (%):  [35 %-100 %] 90 % Set Rate:  [15 bmp-26 bmp] 26 bmp Vt Set:  [510 mL] 510 mL PEEP:  [10 cmH20-14 cmH20] 14 cmH20 Plateau Pressure:  [15 cmH20-32 cmH20] 32 cmH20   Intake/Output Summary (Last 24 hours) at 09/08/2020 1005 Last data filed at 09/08/2020 0600 Gross per 24 hour  Intake 3810.38 ml  Output 645 ml  Net 3165.38 ml   Filed Weights   08/31/20 2115  Weight: 99.7 kg    Examination:  Gen:      elderly woman, no distress , intubated, prone position HEENT:  EOMI, sclera anicteric, mild pallor Neck:     No JVD; no thyromegaly Lungs:   Decreased breath sounds bilateral CV:         Regular rate and rhythm; no murmurs Abd:      Unable to examine today due to prone position, right JP drain with minimal output, Ext:    No edema; adequate peripheral perfusion Skin:      Warm and dry; no rash Neuro: Intubated, sedated  Abdomen exam from 11/9: 'Soft, nondistended, nontender, midline incision-skin is open with wet-to-dry dressing.  There is no active bleeding seen.  Tissue bed is pink.  Right upper quadrant JP intact with minimal bloody drainage seen.  Ostomy intact with dark bilious material-no blood '  Chest x-ray personally reviewed which shows stable bilateral opacities consistent with multifocal pneumonia.  Labs show worsening  creatinine, hyperkalemia, lactic acidosis, high procalcitonin, leukocytosis. ABG shows metabolic acidosis , anion gap of 15  Assessment & Plan:   Shock, favor hemorrhagic over septic -Received 2 units PRBC for hemoglobin of 6.8, await repeat hemoglobin. -We will obtain respiratory and blood cultures and empirically start cefepime and vancomycin -Continue stress dose steroids, Solu-Cortef 50 every 6 -On max dose Levophed and vasopressin  added, if third pressor needed can use GI pressor or epinephrine  ARDS -this seems to be related to shock rather than COVID pneumonia , she was on 6 L oxygen prior to sudden deterioration -Doubt PE as a cause for worsening hypoxia since she was on anticoagulation/Eliquis prior and on Lovenox after surgery, but will obtain venous duplex for completion  -lung protective ventilation with tidal volume 6 to 8 cc/kg. -Plateau and driving pressures at acceptable range -Continue paralysis with Nimbex  -Prone positioning for P/F ratio less than 150  Recent Covid pneumonia -supportive care Initial POS test 10/21 , can come off isolation precautions by tomorrow hopefully  Acute blood loss anemia-status post 2 units PRBC. 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 General surgery following Protonix 40 every 12  Best practice:  Diet: N.p.o.-TPN Pain/Anxiety/Delirium protocol (if indicated):Versed/ fent goal RASS -5 VAP protocol (if indicated): DVT prophylaxis: SCDs GI prophylaxis: Protonix Glucose control: Insulin Mobility: Bedrest Code Status: Full Family Communication: daughter Disposition: ICU  Labs   CBC: Recent Labs  Lab 09/04/20 0514 09/05/20 0355 09/06/20 0320 09/06/20 0320 09/07/20 0118 09/07/20 0118 09/07/20 1625 09/07/20 2159 09/07/20 2202 09/08/20 0148 09/08/20 0234 09/08/20 0422 09/08/20 0446  WBC 12.4*   < > 11.0*  --  12.5*  --  20.2*  --  34.5*  --   --   --  28.7*  NEUTROABS 11.7*  --  9.6*  --   --   --   --    --   --   --   --   --   --   HGB 10.1*   < > 9.9*   < > 9.2*   < > 6.9*   < > 8.8* 8.2* 8.2* 6.8* 7.8*  HCT 32.7*   < > 33.1*   < > 30.4*   < > 22.9*   < > 28.3* 24.0* 24.0* 20.0* 24.1*  MCV 75.2*   < > 75.7*  --  76.2*  --  76.8*  --  81.6  --   --   --  81.1  PLT 291   < > 248  --  233  --  216  --  201  --   --   --  168   < > = values in this interval not displayed.    Basic Metabolic Panel: Recent Labs  Lab 09/04/20 0514 09/04/20 0514 09/05/20 0355 09/05/20 0355 09/06/20 0320 09/06/20 0320 09/07/20 0118 09/07/20 0118 09/07/20 1625 09/07/20 1625 09/07/20 2159 09/08/20 0148 09/08/20 0234 09/08/20 0422 09/08/20 0446  NA 150*   < > 153*   < > 150*   < > 146*   < > 141   < > 144 146* 146* 144 144  K 3.9   < > 4.0   < > 4.7   < > 4.3   < > 4.9   < > 4.8 5.3* 5.5* 5.8* 6.0*  CL 117*   < > 119*  --  118*  --  115*  --  111  --   --   --   --   --  113*  CO2 26   < > 27  --  26  --  25  --  19*  --   --   --   --   --  16*  GLUCOSE 402*   < > 261*  --  255*  --  263*  --  355*  --   --   --   --   --  139*  BUN 40*   < > 37*  --  32*  --  26*  --  30*  --   --   --   --   --  39*  CREATININE 1.35*   < > 1.02*  --  0.86  --  0.75  --  0.93  --   --   --   --   --  1.71*  CALCIUM 9.7   < > 9.4  --  9.4  --  9.7  --  9.8  --   --   --   --   --  9.2  MG 1.9  --  1.8  --  2.0  --  1.6*  --   --   --   --   --   --   --  2.3  PHOS 2.7  --  2.0*  --  2.5  --  2.5  --   --   --   --   --   --   --  8.6*   < > = values in this interval not displayed.   GFR: Estimated Creatinine Clearance: 38.3 mL/min (A) (by C-G formula based on SCr of 1.71 mg/dL (H)). Recent Labs  Lab 09/06/20 0320 09/06/20 0320 09/07/20 0118 09/07/20 1625 09/07/20 2202 09/07/20 2232 09/08/20 0446  PROCALCITON 0.67  --   --  0.27 0.61  --  7.57  WBC 11.0*   < > 12.5* 20.2* 34.5*  --  28.7*  LATICACIDVEN  --   --   --   --   --  9.9* 9.4*   < > = values in this interval not displayed.    Liver Function  Tests: Recent Labs  Lab 09/03/20 0054 09/04/20 0514 09/05/20 0355 09/06/20 0320 09/07/20 0118  AST 17 20 35 60* 59*  ALT 13 20 22  43 56*  ALKPHOS 46 65 91 114 85  BILITOT 0.7 0.5 0.4 0.1* 0.6  PROT 4.8* 5.4* 5.0* 5.1* 4.9*  ALBUMIN 1.8* 1.9* 1.8* 1.8* 1.7*   No results for input(s): LIPASE, AMYLASE in the last 168 hours. No results for input(s): AMMONIA in the last 168 hours.  ABG    Component Value Date/Time   PHART 7.195 (LL) 09/08/2020 0422   PCO2ART 42.1 09/08/2020 0422   PO2ART 68 (L) 09/08/2020 0422   HCO3 16.5 (L) 09/08/2020 0422   TCO2 18 (L) 09/08/2020 0422   ACIDBASEDEF 11.0 (H) 09/08/2020 0422   O2SAT 90.0 09/08/2020 0422      Critical care time x 60 minutes  Kara Mead MD. FCCP. [facility] See Amion for pager  If no response to pager ,  please call [phone]  After 7:00 pm call Elink  [phone]   09/08/2020

## 2020-09-08 NOTE — Progress Notes (Signed)
Patient proned at 0330am, patient currently tolerating well, RT will continue to monitor.

## 2020-09-08 NOTE — Progress Notes (Signed)
OT Cancellation Note  Patient Details Name: Candace [name] MRN: [id number] DOB: [birth year]   Cancelled Treatment:    Reason Eval/Treat Not Completed: Patient not medically ready Pt was t/f to ICU yesterday- now intubated, sedated, paralyzed, and proned. Will follow up as medically able to participate.  Zenovia Jarred, MSOT, OTR/L Acute Rehabilitation Services [facility] Office Number: [phone] Pager: [phone]  Zenovia Jarred 09/08/2020, 12:56 PM

## 2020-09-08 NOTE — Progress Notes (Signed)
eLink Physician-Brief Progress Note Patient Name: Candace [name] DOB: [birth year] MRN: [id number]   Date of Service  09/08/2020  HPI/Events of Note  Blood sugar > 300 mg %,   RN requesting clarification of BP goal.  eICU Interventions  Insulin infusion started, blood pressure goal clarified for now as MAP > 65 mmHg.        Candace [name] 09/08/2020, 9:59 PM

## 2020-09-08 NOTE — Progress Notes (Signed)
   09/07/20 1230  Assess: MEWS Score  Temp 98.4 F (36.9 C)  BP (!) 77/50  Pulse Rate 96  ECG Heart Rate 99  Resp (!) 38  SpO2 93 %  O2 Device Nasal Cannula  Assess: MEWS Score  MEWS Temp 0  MEWS Systolic 2  MEWS Pulse 0  MEWS RR 3  MEWS LOC 0  MEWS Score 5  MEWS Score Color Red  Assess: if the MEWS score is Yellow or Red  Were vital signs taken at a resting state? No  Focused Assessment Change from prior assessment (see assessment flowsheet)  Early Detection of Sepsis Score *See Row Information* Medium  MEWS guidelines implemented *See Row Information* No, vital signs rechecked  Treat  MEWS Interventions Administered scheduled meds/treatments;Escalated (See documentation below)  Pain Scale 0-10  Pain Score 4  Take Vital Signs  Increase Vital Sign Frequency  Red: Q 1hr X 4 then Q 4hr X 4, if remains red, continue Q 4hrs  Escalate  MEWS: Escalate Red: discuss with charge nurse/RN and provider, consider discussing with RRT  Notify: Charge Nurse/RN  Name of Charge Nurse/RN Notified Charlynne Cousins RN  Date Charge Nurse/RN Notified 09/07/20  Time Charge Nurse/RN Notified 1255  Notify: Provider  Provider Name/Title Dr. Candiss Norse  Date Provider Notified 09/07/20  Time Provider Notified 1238  Notification Type Page  Notification Reason Change in status  Response See new orders  Date of Provider Response 09/07/20  Time of Provider Response [id number]  Document  Patient Outcome Not stable and remains on department  Progress note created (see row info) Yes

## 2020-09-08 NOTE — Plan of Care (Signed)
Contacted by partner re: starting CRRT tonight as anuric and hyperkalemic.  Spoke with patient's daughter who provided consent for RRT.  We discussed the risks/benefits/indications for CRRT.   Orders in place.  Spoke with MICU RN  Claudia Desanctis, MD 6:55 PM 09/08/2020

## 2020-09-08 NOTE — Progress Notes (Signed)
Pharmacy Antibiotic Note  Candace [name] is a 69 y.o. female admitted on 08/31/2020 with worsening shortness of breath and COVID-19. Chest CT on 11/10 shows bilateral lung opacities and there is now the concern for pneumonia. Pharmacy has been consulted for vancomycin and cefepime dosing. Renal function is variable. Afebrile, Scr 1.71, WBC 28.7, CrCl 38.3 mL/min.   Plan: - Give vancomycin IV 2000 mg x1 dose - No standing vancomycin order will be entered due to variable renal function, doses will be entered as needed based on renal function - Obtain vancomycin trough levels at steady state - Target vancomycin trough of 15-20 mcg/mL - Initiate cefepime IV 2 g q12h - Monitor renal function, clinical status, cultures and length of therapy - Deescalate therapy as clinically indicated  Height: 5\' 8"  (172.7 cm) Weight: 99.7 kg (219 lb 12.8 oz) IBW/kg (Calculated) : 63.9  Temp (24hrs), Avg:96.1 F (35.6 C), Min:78.8 F (26 C), Max:98.6 F (37 C)  Recent Labs  Lab 09/05/20 0355 09/05/20 0355 09/06/20 0320 09/07/20 0118 09/07/20 1625 09/07/20 2202 09/07/20 2232 09/08/20 0446  WBC 9.7   < > 11.0* 12.5* 20.2* 34.5*  --  28.7*  CREATININE 1.02*  --  0.86 0.75 0.93  --   --  1.71*  LATICACIDVEN  --   --   --   --   --   --  9.9* 9.4*   < > = values in this interval not displayed.    Estimated Creatinine Clearance: 38.3 mL/min (A) (by C-G formula based on SCr of 1.71 mg/dL (H)).    No Known Allergies  Antimicrobials this admission: 10/31 levofloxacin x1 dose 11/2 azithromycin > 11/3  11/2 ceftriaxone x1 dose 11/3 fluconazole x1 dose 11/3 Zosyn > 11/7  Dose adjustments this admission: None  Microbiology results: 11/10 BCx: sent 10/25 [place] NGTD 11/10 Tracheal Aspirate: sent 11/10 MRSA PCR: sent 10/26 MRSA PCR: negative  Thank you for allowing pharmacy to be a part of this patient's care.  Shauna Hugh, PharmD, [place]  PGY-1 Pharmacy Resident 09/08/2020 8:48 AM  Please check  [email or web address] for unit-specific pharmacy phone numbers.

## 2020-09-08 NOTE — Progress Notes (Signed)
Nutrition Follow-up  DOCUMENTATION CODES:   Obesity unspecified  INTERVENTION:   Tube Feeding via OG: Plan to place Cortrak when able Iintiate Pivot 1.5 at 20 ml/hr (trickles), monitor tolerance  Continue TPN at GOAL rate until pt demonstrating tolerating of TF and TF being titrated to goal rate. Calories provided from TF should NOT be included at this time as just attempting to establish tolerance at this time   NUTRITION DIAGNOSIS:   Inadequate oral intake related to inability to eat as evidenced by NPO status.  Being addressed via nutrition support   GOAL:   Patient will meet greater than or equal to 90% of their needs  MONITOR:   Diet advancement, Skin, Weight trends, Labs, I & O's, Other (Comment) (TPN tolerance)  REASON FOR ASSESSMENT:   Consult New TPN/TNA  ASSESSMENT:   69 y.o. female was recently hospitalized for COVID-19 pneumonia-and discharged on 2-3 L of home O2-presents with worsening shortness of breath-found to have A. fib with RVR, worsening hypoxia. Pt developed severe upper abdominal pain-subsequent further imaging studies revealed perforated viscus. S/p laparotomy on 11/3 which showed a perforated duodenal ulcer.  11/03 Exploratory laparotomy, adhesiolysis, modified graham patch repair of perforated duodenal ulcer 11/05 TPN initiated 11/08 UGI negative for leak 11/09 Acute decline in respiratory and mental status requiring intubation  UGI negative for leak 11/8, with NG removal and diet advancement  Pt currently sedated on vent support, prone position, paralyzed. Currently requiring levophed and vasopressin, MAPs >65  OG tube in stomach per abd xray. +stool via ileostomy  Noted hyperkalemia; oliguric renal failure with possible need for CRRT  No new weight since admission  Labs: potassium 6.3 (H), Creatinine 1.52 Meds: ss novolog, miralax, levemir   Diet Order:   Diet Order            Diet NPO time specified  Diet effective now                  EDUCATION NEEDS:   Not appropriate for education at this time  Skin:  Skin Assessment: Skin Integrity Issues: Skin Integrity Issues:: Stage I Stage I: sacrum Incisions: abdomen  Last BM:  11/10 ostomy  Height:   Ht Readings from Last 1 Encounters:  08/31/20 5\' 8"  (1.727 m)    Weight:   Wt Readings from Last 1 Encounters:  08/31/20 99.7 kg    BMI:  Body mass index is 33.42 kg/m.  Estimated Nutritional Needs:   Kcal:  2200-2450  Protein:  115-130 grams  Fluid:  >/= 2 L/day    Kerman Passey MS, RDN, LDN, CNSC Registered Dietitian III Clinical Nutrition RD Pager and On-Call Pager Number Located in [place]

## 2020-09-08 NOTE — Progress Notes (Addendum)
eLink Physician-Brief Progress Note Patient Name: Candace [name] DOB: [birth year] MRN: [id number]   Date of Service  09/08/2020  HPI/Events of Note  Hemoglobin 6.8 gm  eICU Interventions  Transfuse 1 unit PRBC. CT abdomen/ pelvis without contrast r/o intra-abdominal hematoma.        Kerry Kass [name] 09/08/2020, 5:37 AM

## 2020-09-08 NOTE — Progress Notes (Signed)
Dr. Royce Macadamia with nephrology confirmed 2K is the correct prismasol solution at this time.

## 2020-09-08 NOTE — Consult Note (Addendum)
[facility] Nephrology Consultation Note  Requesting MD: Dr Elsworth Soho, R Reason for consult: AKI and hyperkalemia  HPI:  Candace [name] is a 69 y.o. female with history of DM, HTN, HLD, colon cancer status post partial colectomy, colostomy who was admitted on 11/2 with A. fib with RVR, found to have perforated duodenal ulcer and underwent exploratory laparotomy surgery, seen as a consultation at the request of Dr. Elsworth Soho for acute kidney injury and hyperkalemia. The patient was diagnosed with Covid on 10/25 and subsequently developed A. fib with RVR and hypoxia.  She was initially admitted to medical floor and was placed on Cardizem drip and  Eliquis. Then she started having abdominal pain which was later diagnosed as perforated duodenal ulcer for which she was taken to the OR for extensive surgery/ex lap.  On postoperative day 6, she developed excessive bleeding from the surgical site with drop in hemoglobin to 6.9.  She also developed hypotensive episode.  She was transferred to ICU for hemorrhagic shock requiring pressors, blood transfusion and required mechanical ventilation for respiratory failure. She had a creatinine level of 5.62 on 10/25 which was gradually improved to 0.9 on 11/9.  However with episode of hypotension, shock with a creatinine level started going up concomitant with hyperkalemia.  Patient was treated medically for hyperkalemia without much success.  She was making urine until yesterday however no urine output today. She is currently intubated, sedated and on prone position.  She was febrile to 100.8 and blood pressure improved with Levophed.  Receiving TPN. Today the labs showed sodium 141, potassium 6.3, CO2 19, creatinine level elevated to 1.92, lactic acid improved to 4.1 from 9.4. She has been followed by cardiology for A. fib with RVR and general surgery.  Creatinine, Ser  Date/Time Value Ref Range Status  09/08/2020 10:00 AM 1.92 (H) 0.44 - 1.00 mg/dL Final   09/08/2020 04:46 AM 1.71 (H) 0.44 - 1.00 mg/dL Final  09/07/2020 04:25 PM 0.93 0.44 - 1.00 mg/dL Final  09/07/2020 01:18 AM 0.75 0.44 - 1.00 mg/dL Final  09/06/2020 03:20 AM 0.86 0.44 - 1.00 mg/dL Final  09/05/2020 03:55 AM 1.02 (H) 0.44 - 1.00 mg/dL Final  09/04/2020 05:14 AM 1.35 (H) 0.44 - 1.00 mg/dL Final  09/03/2020 12:54 AM 1.51 (H) 0.44 - 1.00 mg/dL Final  09/02/2020 03:13 AM 1.53 (H) 0.44 - 1.00 mg/dL Final  09/01/2020 12:43 AM 1.51 (H) 0.44 - 1.00 mg/dL Final  08/31/2020 01:15 PM 1.69 (H) 0.44 - 1.00 mg/dL Final  08/29/2020 03:23 AM 1.45 (H) 0.44 - 1.00 mg/dL Final  08/28/2020 02:41 AM 1.38 (H) 0.44 - 1.00 mg/dL Final  08/27/2020 12:38 AM 1.53 (H) 0.44 - 1.00 mg/dL Final  08/26/2020 01:45 AM 1.99 (H) 0.44 - 1.00 mg/dL Final  08/25/2020 12:44 AM 2.56 (H) 0.44 - 1.00 mg/dL Final  08/24/2020 01:22 AM 3.63 (H) 0.44 - 1.00 mg/dL Final    Comment:    DELTA CHECK NOTED  08/23/2020 12:05 PM 5.62 (H) 0.44 - 1.00 mg/dL Final     PMHx:   Past Medical History:  Diagnosis Date  . Colon cancer ([place])   . Diabetes mellitus ([place])   . Hyperlipemia   . Hypertension     Past Surgical History:  Procedure Laterality Date  . LAPAROTOMY N/A 09/01/2020   Procedure: EXPLORATORY LAPAROTOMY; Repair of Perforated Duodenal Ulcer;  Surgeon: Jesusita Oka, MD;  Location: [facility];  Service: General;  Laterality: N/A;  . LYSIS OF ADHESION N/A 09/01/2020   Procedure: LYSIS OF  ADHESION;  Surgeon: Jesusita Oka, MD;  Location: [facility] OR;  Service: General;  Laterality: N/A;    Family Hx:  Family History  Problem Relation Age of Onset  . Clotting disorder Mother        "died of a blood clot"  . Alzheimer's disease Father     Social History:  reports that she has never smoked. She has never used smokeless tobacco. She reports that she does not drink alcohol and does not use drugs.  Allergies: No Known Allergies  Medications: Prior to Admission medications   Medication Sig Start Date End Date  Taking? Authorizing Provider  acetaminophen (TYLENOL) 500 MG tablet Take 1,000 mg by mouth every 6 (six) hours as needed for mild pain.   Yes [provider]  albuterol (VENTOLIN HFA) 108 (90 Base) MCG/ACT inhaler Inhale 2 puffs into the lungs every 6 (six) hours as needed for wheezing or shortness of breath. 08/29/20  Yes Thurnell Lose, MD  amLODipine (NORVASC) 10 MG tablet Take 1 tablet (10 mg total) by mouth daily. 08/29/20 08/29/21 Yes Thurnell Lose, MD  apixaban (ELIQUIS) 2.5 MG TABS tablet Take 1 tablet (2.5 mg total) by mouth 2 (two) times daily. 08/29/20  Yes Thurnell Lose, MD  aspirin 81 MG EC tablet Take 81 mg by mouth daily. 06/20/18  Yes [provider]  levofloxacin (LEVAQUIN) 250 MG tablet Take 1 tablet (250 mg total) by mouth daily. 08/29/20  Yes Thurnell Lose, MD  metFORMIN (GLUCOPHAGE-XR) 500 MG 24 hr tablet Take 500 mg by mouth in the morning and at bedtime.  07/16/20  Yes [provider]  rosuvastatin (CRESTOR) 40 MG tablet Take 40 mg by mouth daily. 06/22/20  Yes [provider]  zinc gluconate 50 MG tablet Take 50 mg by mouth daily.   Yes [provider]    I have reviewed the patient's current medications.  Labs:  Results for orders placed or performed during the hospital encounter of 08/31/20 (from the past 48 hour(s))  Glucose, capillary     Status: Abnormal   Collection Time: 09/06/20  4:29 PM  Result Value Ref Range   Glucose-Capillary 275 (H) 70 - 99 mg/dL    Comment: Glucose reference range applies only to samples taken after fasting for at least 8 hours.  Glucose, capillary     Status: Abnormal   Collection Time: 09/06/20  8:21 PM  Result Value Ref Range   Glucose-Capillary 212 (H) 70 - 99 mg/dL    Comment: Glucose reference range applies only to samples taken after fasting for at least 8 hours.  Glucose, capillary     Status: Abnormal   Collection Time: 09/07/20 12:01 AM  Result Value Ref Range    Glucose-Capillary 205 (H) 70 - 99 mg/dL    Comment: Glucose reference range applies only to samples taken after fasting for at least 8 hours.   Comment 1 Document in Chart   CBC     Status: Abnormal   Collection Time: 09/07/20  1:18 AM  Result Value Ref Range   WBC 12.5 (H) 4.0 - 10.5 K/uL   RBC 3.99 3.87 - 5.11 MIL/uL   Hemoglobin 9.2 (L) 12.0 - 15.0 g/dL   HCT 30.4 (L) 36 - 46 %   MCV 76.2 (L) 80.0 - 100.0 fL   MCH 23.1 (L) 26.0 - 34.0 pg   MCHC 30.3 30.0 - 36.0 g/dL   RDW 15.5 11.5 - 15.5 %   Platelets 233  150 - 400 K/uL   nRBC 0.4 (H) 0.0 - 0.2 %    Comment: Performed at [facility], [place] [address], [place], [place] [address]  Comprehensive metabolic panel     Status: Abnormal   Collection Time: 09/07/20  1:18 AM  Result Value Ref Range   Sodium 146 (H) 135 - 145 mmol/L   Potassium 4.3 3.5 - 5.1 mmol/L   Chloride 115 (H) 98 - 111 mmol/L   CO2 25 22 - 32 mmol/L   Glucose, Bld 263 (H) 70 - 99 mg/dL    Comment: Glucose reference range applies only to samples taken after fasting for at least 8 hours.   BUN 26 (H) 8 - 23 mg/dL   Creatinine, Ser 0.75 0.44 - 1.00 mg/dL   Calcium 9.7 8.9 - 10.3 mg/dL   Total Protein 4.9 (L) 6.5 - 8.1 g/dL   Albumin 1.7 (L) 3.5 - 5.0 g/dL   AST 59 (H) 15 - 41 U/L   ALT 56 (H) 0 - 44 U/L   Alkaline Phosphatase 85 38 - 126 U/L   Total Bilirubin 0.6 0.3 - 1.2 mg/dL   GFR, Estimated >60 >60 mL/min    Comment: (NOTE) Calculated using the CKD-EPI Creatinine Equation (2021)    Anion gap 6 5 - 15    Comment: Performed at [facility], [place] [address], [place], [place] [address]  Brain natriuretic peptide     Status: Abnormal   Collection Time: 09/07/20  1:18 AM  Result Value Ref Range   B Natriuretic Peptide 216.8 (H) 0.0 - 100.0 pg/mL    Comment: Performed at [facility] [address] [place], [place] [address]  Magnesium     Status: Abnormal   Collection Time: 09/07/20  1:18 AM  Result Value Ref Range   Magnesium 1.6 (L)  1.7 - 2.4 mg/dL    Comment: Performed at [facility] [address], [place], [place] [address]  Phosphorus     Status: None   Collection Time: 09/07/20  1:18 AM  Result Value Ref Range   Phosphorus 2.5 2.5 - 4.6 mg/dL    Comment: Performed at [facility] [address], [place], [place] [address]  Glucose, capillary     Status: Abnormal   Collection Time: 09/07/20  4:01 AM  Result Value Ref Range   Glucose-Capillary 247 (H) 70 - 99 mg/dL    Comment: Glucose reference range applies only to samples taken after fasting for at least 8 hours.   Comment 1 Document in Chart   Glucose, capillary     Status: Abnormal   Collection Time: 09/07/20  7:47 AM  Result Value Ref Range   Glucose-Capillary 250 (H) 70 - 99 mg/dL    Comment: Glucose reference range applies only to samples taken after fasting for at least 8 hours.  Glucose, capillary     Status: Abnormal   Collection Time: 09/07/20 11:59 AM  Result Value Ref Range   Glucose-Capillary 340 (H) 70 - 99 mg/dL    Comment: Glucose reference range applies only to samples taken after fasting for at least 8 hours.  Prepare RBC (crossmatch)     Status: None   Collection Time: 09/07/20  1:47 PM  Result Value Ref Range   Order Confirmation      ORDER PROCESSED BY BLOOD BANK Performed at [facility], [place] [address], [place], [place] [address]   Type and screen  Status: None (Preliminary result)   Collection Time: 09/07/20  3:57 PM  Result Value Ref Range   ABO/RH(D) B POS    Antibody Screen NEG    Sample Expiration 09/10/2020,2359    Unit Number [id number]    Blood Component Type RED CELLS,LR    Unit division 00    Status of Unit ISSUED,FINAL    Transfusion Status OK TO TRANSFUSE    Crossmatch Result      Compatible Performed at [facility], [place] [address], [place], [place] [address]    Unit Number [id number]    Blood Component Type RED CELLS,LR    Unit division 00    Status of Unit ISSUED     Transfusion Status OK TO TRANSFUSE    Crossmatch Result Compatible   Procalcitonin - Baseline     Status: None   Collection Time: 09/07/20  4:25 PM  Result Value Ref Range   Procalcitonin 0.27 ng/mL    Comment:        Interpretation: PCT (Procalcitonin) <= 0.5 ng/mL: Systemic infection (sepsis) is not likely. Local bacterial infection is possible. (NOTE)       Sepsis PCT Algorithm           Lower Respiratory Tract                                      Infection PCT Algorithm    ----------------------------     ----------------------------         PCT < 0.25 ng/mL                PCT < 0.10 ng/mL          Strongly encourage             Strongly discourage   discontinuation of antibiotics    initiation of antibiotics    ----------------------------     -----------------------------       PCT 0.25 - 0.50 ng/mL            PCT 0.10 - 0.25 ng/mL               OR       >80% decrease in PCT            Discourage initiation of                                            antibiotics      Encourage discontinuation           of antibiotics    ----------------------------     -----------------------------         PCT >= 0.50 ng/mL              PCT 0.26 - 0.50 ng/mL               AND        <80% decrease in PCT             Encourage initiation of                                             antibiotics  Encourage continuation           of antibiotics    ----------------------------     -----------------------------        PCT >= 0.50 ng/mL                  PCT > 0.50 ng/mL               AND         increase in PCT                  Strongly encourage                                      initiation of antibiotics    Strongly encourage escalation           of antibiotics                                     -----------------------------                                           PCT <= 0.25 ng/mL                                                 OR                                        > 80%  decrease in PCT                                      Discontinue / Do not initiate                                             antibiotics  Performed at [facility], [address], [place], [place] [address]   CBC     Status: Abnormal   Collection Time: 09/07/20  4:25 PM  Result Value Ref Range   WBC 20.2 (H) 4.0 - 10.5 K/uL   RBC 2.98 (L) 3.87 - 5.11 MIL/uL   Hemoglobin 6.9 (LL) 12.0 - 15.0 g/dL    Comment: REPEATED TO VERIFY Reticulocyte Hemoglobin testing may be clinically indicated, consider ordering this additional test [id number] THIS CRITICAL RESULT HAS VERIFIED AND BEEN CALLED TO ANN MCCULEY BY K'LA SANDERS ON 11 09 2021 AT 1722, AND HAS BEEN READ BACK.     HCT 22.9 (L) 36 - 46 %   MCV 76.8 (L) 80.0 - 100.0 fL   MCH 23.2 (L) 26.0 - 34.0 pg   MCHC 30.1 30.0 - 36.0 g/dL   RDW 15.5 11.5 - 15.5 %   Platelets 216 150 - 400 K/uL   nRBC 1.3 (H) 0.0 - 0.2 %    Comment: Performed at [facility], [place] [address], [place],  [address]  Basic metabolic panel     Status: Abnormal   Collection Time: 09/07/20  4:25 PM  Result Value Ref Range   Sodium 141 135 - 145 mmol/L   Potassium 4.9 3.5 - 5.1 mmol/L   Chloride 111 98 - 111 mmol/L   CO2 19 (L) 22 - 32 mmol/L   Glucose, Bld 355 (H) 70 - 99 mg/dL    Comment: Glucose reference range applies only to samples taken after fasting for at least 8 hours.   BUN 30 (H) 8 - 23 mg/dL   Creatinine, Ser 0.93 0.44 - 1.00 mg/dL   Calcium 9.8 8.9 - 10.3 mg/dL   GFR, Estimated >60 >60 mL/min    Comment: (NOTE) Calculated using the CKD-EPI Creatinine Equation (2021)    Anion gap 11 5 - 15    Comment: Performed at [facility] [address] [place], [place] [address]  Glucose, capillary     Status: Abnormal   Collection Time: 09/07/20  4:53 PM  Result Value Ref Range   Glucose-Capillary 305 (H) 70 - 99 mg/dL    Comment: Glucose reference range applies only to samples taken after fasting for at least 8 hours.   Glucose, capillary     Status: Abnormal   Collection Time: 09/07/20  6:07 PM  Result Value Ref Range   Glucose-Capillary 331 (H) 70 - 99 mg/dL    Comment: Glucose reference range applies only to samples taken after fasting for at least 8 hours.  Glucose, capillary     Status: Abnormal   Collection Time: 09/07/20  7:58 PM  Result Value Ref Range   Glucose-Capillary 316 (H) 70 - 99 mg/dL    Comment: Glucose reference range applies only to samples taken after fasting for at least 8 hours.  I-STAT 7, (LYTES, BLD GAS, ICA, H+H)     Status: Abnormal   Collection Time: 09/07/20  9:59 PM  Result Value Ref Range   pH, Arterial 7.154 (LL) 7.35 - 7.45   pCO2 arterial 30.3 (L) 32 - 48 mmHg   pO2, Arterial 71 (L) 83 - 108 mmHg   Bicarbonate 10.9 (L) 20.0 - 28.0 mmol/L   TCO2 12 (L) 22 - 32 mmol/L   O2 Saturation 91.0 %   Acid-base deficit 17.0 (H) 0.0 - 2.0 mmol/L   Sodium 144 135 - 145 mmol/L   Potassium 4.8 3.5 - 5.1 mmol/L   Calcium, Ion 1.38 1.15 - 1.40 mmol/L   HCT 26.0 (L) 36 - 46 %   Hemoglobin 8.8 (L) 12.0 - 15.0 g/dL   Patient temperature 95.3 F    Collection site Magazine features editor by Operator    Sample type ARTERIAL    Comment NOTIFIED PHYSICIAN   CBC     Status: Abnormal   Collection Time: 09/07/20 10:02 PM  Result Value Ref Range   WBC 34.5 (H) 4.0 - 10.5 K/uL   RBC 3.47 (L) 3.87 - 5.11 MIL/uL   Hemoglobin 8.8 (L) 12.0 - 15.0 g/dL    Comment: REPEATED TO VERIFY POST TRANSFUSION SPECIMEN    HCT 28.3 (L) 36 - 46 %   MCV 81.6 80.0 - 100.0 fL   MCH 25.4 (L) 26.0 - 34.0 pg   MCHC 31.1 30.0 - 36.0 g/dL   RDW 16.8 (H) 11.5 - 15.5 %   Platelets 201 150 - 400 K/uL   nRBC 3.5 (H) 0.0 - 0.2 %  Comment: Performed at [facility], [place] [address], [place], [place] [address]  Cortisol     Status: None   Collection Time: 09/07/20 10:02 PM  Result Value Ref Range   Cortisol, Plasma 47.6 ug/dL    Comment: (NOTE) AM    6.7 - 22.6 ug/dL PM   <10.0       ug/dL Performed at  [facility] [address] [place], [place] [address]   Procalcitonin - Baseline     Status: None   Collection Time: 09/07/20 10:02 PM  Result Value Ref Range   Procalcitonin 0.61 ng/mL    Comment:        Interpretation: PCT > 0.5 ng/mL and <= 2 ng/mL: Systemic infection (sepsis) is possible, but other conditions are known to elevate PCT as well. (NOTE)       Sepsis PCT Algorithm           Lower Respiratory Tract                                      Infection PCT Algorithm    ----------------------------     ----------------------------         PCT < 0.25 ng/mL                PCT < 0.10 ng/mL          Strongly encourage             Strongly discourage   discontinuation of antibiotics    initiation of antibiotics    ----------------------------     -----------------------------       PCT 0.25 - 0.50 ng/mL            PCT 0.10 - 0.25 ng/mL               OR       >80% decrease in PCT            Discourage initiation of                                            antibiotics      Encourage discontinuation           of antibiotics    ----------------------------     -----------------------------         PCT >= 0.50 ng/mL              PCT 0.26 - 0.50 ng/mL                AND       <80% decrease in PCT             Encourage initiation of                                             antibiotics       Encourage continuation           of antibiotics    ----------------------------     -----------------------------        PCT >= 0.50 ng/mL  PCT > 0.50 ng/mL               AND         increase in PCT                  Strongly encourage                                      initiation of antibiotics    Strongly encourage escalation           of antibiotics                                     -----------------------------                                           PCT <= 0.25 ng/mL                                                 OR                                         > 80% decrease in PCT                                      Discontinue / Do not initiate                                             antibiotics  Performed at [facility], [address] [place], [place] [address]   Lactic acid, plasma     Status: Abnormal   Collection Time: 09/07/20 10:32 PM  Result Value Ref Range   Lactic Acid, Venous 9.9 (HH) 0.5 - 1.9 mmol/L    Comment: CRITICAL RESULT CALLED TO, READ BACK BY AND VERIFIED WITH: WEAVER J,RN 09/07/20 2327 WAYK Performed at [facility] [address] [place], [place] [address]   Glucose, capillary     Status: Abnormal   Collection Time: 09/07/20 11:46 PM  Result Value Ref Range   Glucose-Capillary 153 (H) 70 - 99 mg/dL    Comment: Glucose reference range applies only to samples taken after fasting for at least 8 hours.  I-STAT 7, (LYTES, BLD GAS, ICA, H+H)     Status: Abnormal   Collection Time: 09/08/20  1:48 AM  Result Value Ref Range   pH, Arterial 7.268 (L) 7.35 - 7.45   pCO2 arterial 37.0 32 - 48 mmHg   pO2, Arterial 57 (L) 83 - 108 mmHg   Bicarbonate 17.0 (L) 20.0 - 28.0 mmol/L   TCO2 18 (L) 22 - 32 mmol/L   O2 Saturation 86.0 %   Acid-base deficit 9.0 (H) 0.0 - 2.0 mmol/L   Sodium 146 (H) 135 - 145 mmol/L  Potassium 5.3 (H) 3.5 - 5.1 mmol/L   Calcium, Ion 1.35 1.15 - 1.40 mmol/L   HCT 24.0 (L) 36 - 46 %   Hemoglobin 8.2 (L) 12.0 - 15.0 g/dL   Patient temperature 98.0 F    Sample type ARTERIAL   I-STAT 7, (LYTES, BLD GAS, ICA, H+H)     Status: Abnormal   Collection Time: 09/08/20  2:34 AM  Result Value Ref Range   pH, Arterial 7.279 (L) 7.35 - 7.45   pCO2 arterial 35.0 32 - 48 mmHg   pO2, Arterial 48 (L) 83 - 108 mmHg   Bicarbonate 16.8 (L) 20.0 - 28.0 mmol/L   TCO2 18 (L) 22 - 32 mmol/L   O2 Saturation 83.0 %   Acid-base deficit 10.0 (H) 0.0 - 2.0 mmol/L   Sodium 146 (H) 135 - 145 mmol/L   Potassium 5.5 (H) 3.5 - 5.1 mmol/L   Calcium, Ion 1.37 1.15 - 1.40 mmol/L   HCT 24.0 (L) 36 - 46 %    Hemoglobin 8.2 (L) 12.0 - 15.0 g/dL   Patient temperature 35.2 C    Sample type ARTERIAL   Glucose, capillary     Status: Abnormal   Collection Time: 09/08/20  3:42 AM  Result Value Ref Range   Glucose-Capillary 123 (H) 70 - 99 mg/dL    Comment: Glucose reference range applies only to samples taken after fasting for at least 8 hours.  I-STAT 7, (LYTES, BLD GAS, ICA, H+H)     Status: Abnormal   Collection Time: 09/08/20  4:22 AM  Result Value Ref Range   pH, Arterial 7.195 (LL) 7.35 - 7.45   pCO2 arterial 42.1 32 - 48 mmHg   pO2, Arterial 68 (L) 83 - 108 mmHg   Bicarbonate 16.5 (L) 20.0 - 28.0 mmol/L   TCO2 18 (L) 22 - 32 mmol/L   O2 Saturation 90.0 %   Acid-base deficit 11.0 (H) 0.0 - 2.0 mmol/L   Sodium 144 135 - 145 mmol/L   Potassium 5.8 (H) 3.5 - 5.1 mmol/L   Calcium, Ion 1.35 1.15 - 1.40 mmol/L   HCT 20.0 (L) 36 - 46 %   Hemoglobin 6.8 (LL) 12.0 - 15.0 g/dL   Patient temperature 35.8 C    Sample type ARTERIAL    Comment NOTIFIED PHYSICIAN   Brain natriuretic peptide     Status: Abnormal   Collection Time: 09/08/20  4:46 AM  Result Value Ref Range   B Natriuretic Peptide 432.5 (H) 0.0 - 100.0 pg/mL    Comment: Performed at [facility], [place] [address], [place], [place] [address]  Basic metabolic panel     Status: Abnormal   Collection Time: 09/08/20  4:46 AM  Result Value Ref Range   Sodium 144 135 - 145 mmol/L   Potassium 6.0 (H) 3.5 - 5.1 mmol/L   Chloride 113 (H) 98 - 111 mmol/L   CO2 16 (L) 22 - 32 mmol/L   Glucose, Bld 139 (H) 70 - 99 mg/dL    Comment: Glucose reference range applies only to samples taken after fasting for at least 8 hours.   BUN 39 (H) 8 - 23 mg/dL   Creatinine, Ser 1.71 (H) 0.44 - 1.00 mg/dL   Calcium 9.2 8.9 - 10.3 mg/dL   GFR, Estimated 32 (L) >60 mL/min    Comment: (NOTE) Calculated using the CKD-EPI Creatinine Equation (2021)    Anion gap 15 5 - 15    Comment: Performed at [facility], [address] [place], [place]  [address]  Magnesium     Status: None   Collection Time: 09/08/20  4:46 AM  Result Value Ref Range   Magnesium 2.3 1.7 - 2.4 mg/dL    Comment: Performed at [facility] [address], [place], [place] [address]  Phosphorus     Status: Abnormal   Collection Time: 09/08/20  4:46 AM  Result Value Ref Range   Phosphorus 8.6 (H) 2.5 - 4.6 mg/dL    Comment: Performed at [facility] [address] [place], [place] [address]  CBC     Status: Abnormal   Collection Time: 09/08/20  4:46 AM  Result Value Ref Range   WBC 28.7 (H) 4.0 - 10.5 K/uL   RBC 2.97 (L) 3.87 - 5.11 MIL/uL   Hemoglobin 7.8 (L) 12.0 - 15.0 g/dL   HCT 24.1 (L) 36 - 46 %   MCV 81.1 80.0 - 100.0 fL   MCH 26.3 26.0 - 34.0 pg   MCHC 32.4 30.0 - 36.0 g/dL   RDW 16.2 (H) 11.5 - 15.5 %   Platelets 168 150 - 400 K/uL   nRBC 2.5 (H) 0.0 - 0.2 %    Comment: Performed at [facility] [address], [place],  [address]  Procalcitonin     Status: None   Collection Time: 09/08/20  4:46 AM  Result Value Ref Range   Procalcitonin 7.57 ng/mL    Comment:        Interpretation: PCT > 2 ng/mL: Systemic infection (sepsis) is likely, unless other causes are known. (NOTE)       Sepsis PCT Algorithm           Lower Respiratory Tract                                      Infection PCT Algorithm    ----------------------------     ----------------------------         PCT < 0.25 ng/mL                PCT < 0.10 ng/mL          Strongly encourage             Strongly discourage   discontinuation of antibiotics    initiation of antibiotics    ----------------------------     -----------------------------       PCT 0.25 - 0.50 ng/mL            PCT 0.10 - 0.25 ng/mL               OR       >80% decrease in PCT            Discourage initiation of                                            antibiotics      Encourage discontinuation           of antibiotics    ----------------------------      -----------------------------         PCT >= 0.50 ng/mL              PCT 0.26 - 0.50 ng/mL  AND       <80% decrease in PCT              Encourage initiation of                                             antibiotics       Encourage continuation           of antibiotics    ----------------------------     -----------------------------        PCT >= 0.50 ng/mL                  PCT > 0.50 ng/mL               AND         increase in PCT                  Strongly encourage                                      initiation of antibiotics    Strongly encourage escalation           of antibiotics                                     -----------------------------                                           PCT <= 0.25 ng/mL                                                 OR                                        > 80% decrease in PCT                                      Discontinue / Do not initiate                                             antibiotics  Performed at [facility], [place] [address] [place], [place] [address]   Lactic acid, plasma     Status: Abnormal   Collection Time: 09/08/20  4:46 AM  Result Value Ref Range   Lactic Acid, Venous 9.4 (HH) 0.5 - 1.9 mmol/L    Comment: CRITICAL VALUE NOTED.  VALUE IS CONSISTENT WITH PREVIOUSLY REPORTED AND CALLED VALUE. Performed at [facility], [place] [address], [place], [place] [address]   Triglycerides     Status: Abnormal   Collection Time: 09/08/20  4:46 AM  Result Value Ref Range  Triglycerides 292 (H) <150 mg/dL    Comment: Performed at [facility] [address], [place],  [address]  Prepare RBC (crossmatch)     Status: None   Collection Time: 09/08/20  5:40 AM  Result Value Ref Range   Order Confirmation      ORDER PROCESSED BY BLOOD BANK Performed at [facility], [place] [address], [place], [place] [address]   Glucose, capillary     Status: Abnormal   Collection Time: 09/08/20  7:42  AM  Result Value Ref Range   Glucose-Capillary 125 (H) 70 - 99 mg/dL    Comment: Glucose reference range applies only to samples taken after fasting for at least 8 hours.  MRSA PCR Screening     Status: None   Collection Time: 09/08/20  8:17 AM   Specimen: Nasal Mucosa; Nasopharyngeal  Result Value Ref Range   MRSA by PCR NEGATIVE NEGATIVE    Comment:        The GeneXpert MRSA Assay (FDA approved for NASAL specimens only), is one component of a comprehensive MRSA colonization surveillance program. It is not intended to diagnose MRSA infection nor to guide or monitor treatment for MRSA infections. Performed at [facility], [place] [address], [place],  [address]   Basic metabolic panel     Status: Abnormal   Collection Time: 09/08/20 10:00 AM  Result Value Ref Range   Sodium 141 135 - 145 mmol/L   Potassium 6.2 (H) 3.5 - 5.1 mmol/L   Chloride 112 (H) 98 - 111 mmol/L   CO2 19 (L) 22 - 32 mmol/L   Glucose, Bld 310 (H) 70 - 99 mg/dL    Comment: Glucose reference range applies only to samples taken after fasting for at least 8 hours.   BUN 51 (H) 8 - 23 mg/dL   Creatinine, Ser 1.92 (H) 0.44 - 1.00 mg/dL   Calcium 8.4 (L) 8.9 - 10.3 mg/dL   GFR, Estimated 28 (L) >60 mL/min    Comment: (NOTE) Calculated using the CKD-EPI Creatinine Equation (2021)    Anion gap 10 5 - 15    Comment: Performed at [facility] [address], [place], [place] [address]  I-STAT 7, (LYTES, BLD GAS, ICA, H+H)     Status: Abnormal   Collection Time: 09/08/20 10:07 AM  Result Value Ref Range   pH, Arterial 7.238 (L) 7.35 - 7.45   pCO2 arterial 41.6 32 - 48 mmHg   pO2, Arterial 83 83 - 108 mmHg   Bicarbonate 17.8 (L) 20.0 - 28.0 mmol/L   TCO2 19 (L) 22 - 32 mmol/L   O2 Saturation 94.0 %   Acid-base deficit 9.0 (H) 0.0 - 2.0 mmol/L   Sodium 141 135 - 145 mmol/L   Potassium 6.3 (HH) 3.5 - 5.1 mmol/L   Calcium, Ion 1.25 1.15 - 1.40 mmol/L   HCT 23.0 (L) 36 - 46 %   Hemoglobin 7.8  (L) 12.0 - 15.0 g/dL   Patient temperature 98.6 F    Collection site Radial    Drawn by HIDE    Sample type ARTERIAL    Comment NOTIFIED PHYSICIAN   Hemoglobin and hematocrit, blood     Status: Abnormal   Collection Time: 09/08/20 11:36 AM  Result Value Ref Range   Hemoglobin 7.8 (L) 12.0 - 15.0 g/dL   HCT 23.6 (L) 36 - 46 %    Comment: Performed at [facility] [address]  [place], [place] [address]  Glucose, capillary     Status: Abnormal   Collection Time: 09/08/20 11:38 AM  Result Value Ref Range   Glucose-Capillary 272 (H) 70 - 99 mg/dL    Comment: Glucose reference range applies only to samples taken after fasting for at least 8 hours.  Lactic acid, plasma     Status: Abnormal   Collection Time: 09/08/20 12:12 PM  Result Value Ref Range   Lactic Acid, Venous 4.1 (HH) 0.5 - 1.9 mmol/L    Comment: CRITICAL VALUE NOTED.  VALUE IS CONSISTENT WITH PREVIOUSLY REPORTED AND CALLED VALUE. Performed at [facility], [place] [address] [place],  [address]      ROS: Unable to obtain review of system as patient is sedated and intubated.  Physical Exam: Vitals:   09/08/20 1200 09/08/20 1300  BP: (!) 151/39 (!) 156/47  Pulse: 82 83  Resp: (!) 26 (!) 26  Temp: (!) 100.6 F (38.1 C) (!) 100.8 F (38.2 C)  SpO2: 95% 94%     General exam: Critically ill looking female on prone position, intubated and sedated Respiratory system: Coarse breath sound posteriorly. Cardiovascular system: S1 & S2 heard, RRR.  No pedal edema. Gastrointestinal system: Abdomen has a drain.   Central nervous system: Sedated Extremities: No edema.  No cyanosis Skin: No rashes, lesions or ulcers Psychiatry: Unable to assess  Assessment/Plan:  #Acute kidney injury, oliguric likely ATN due to hemorrhagic shock: Patient initially had AKI with creatinine level of 5.6 on admission due to A. fib with RVR/hemodynamic instability which was improved.  However she developed another episode of  AKI after shock.  She was making urine until yesterday however no output today.  She does not look fluid overloaded on exam however difficult to examine as she is on prone position. Urinalysis was contaminated/UTI. CT scan A/P without hydronephrosis. Maintain MAP >65.  I will start sodium bicarbonate IV fluid at 125 cc an hour.  This would help hyperkalemia as well.  Strict ins and out and avoid nephrotoxins including IV contrast if possible. If no urine output or improvement of renal function by tomorrow then she will need CRRT.  #Hyperkalemia due to AKI and intra-abdominal bleeding/PRBC transfusion: It was managed medically with bicarb, insulin and dextrose. Discussed with the pharmacist to avoid potassium in TPN. Please do not use LR which contains potassium. The sodium bicarb and IV fluids should help. May use lasix with IVF to augment kaliuresis. Monitor labs.  #Hemorrhagic shock: Received blood transfusion.  Blood pressure better with Levophed.  Maintain MAP >65.   #Perforated duodenal ulcer status post ex lap: General surgery is following.  Currently on TPN.  #ARDS/recent Covid pneumonia: Currently on mechanical ventilation, prone position.  Per primary team.  Discussed with ICU team.  Abigaile Rossie Tanna Furry 09/08/2020, 2:14 PM  [facility].

## 2020-09-09 ENCOUNTER — Inpatient Hospital Stay (HOSPITAL_COMMUNITY): Payer: Medicare Other

## 2020-09-09 LAB — RENAL FUNCTION PANEL
Albumin: 1.8 g/dL — ABNORMAL LOW (ref 3.5–5.0)
Albumin: 2 g/dL — ABNORMAL LOW (ref 3.5–5.0)
Anion gap: 9 (ref 5–15)
Anion gap: 8 (ref 5–15)
BUN: 52 mg/dL — ABNORMAL HIGH (ref 8–23)
BUN: 41 mg/dL — ABNORMAL HIGH (ref 8–23)
CO2: 21 mmol/L — ABNORMAL LOW (ref 22–32)
CO2: 24 mmol/L (ref 22–32)
Calcium: 7.4 mg/dL — ABNORMAL LOW (ref 8.9–10.3)
Calcium: 7.6 mg/dL — ABNORMAL LOW (ref 8.9–10.3)
Chloride: 106 mmol/L (ref 98–111)
Chloride: 104 mmol/L (ref 98–111)
Creatinine, Ser: 1.6 mg/dL — ABNORMAL HIGH (ref 0.44–1.00)
Creatinine, Ser: 1.44 mg/dL — ABNORMAL HIGH (ref 0.44–1.00)
GFR, Estimated: 35 mL/min — ABNORMAL LOW (ref >60)
GFR, Estimated: 39 mL/min — ABNORMAL LOW (ref >60)
Glucose, Bld: 231 mg/dL — ABNORMAL HIGH (ref 70–99)
Glucose, Bld: 224 mg/dL — ABNORMAL HIGH (ref 70–99)
Phosphorus: 4.4 mg/dL (ref 2.5–4.6)
Phosphorus: 4.2 mg/dL (ref 2.5–4.6)
Potassium: 4 mmol/L (ref 3.5–5.1)
Potassium: 4.7 mmol/L (ref 3.5–5.1)
Sodium: 136 mmol/L (ref 135–145)
Sodium: 136 mmol/L (ref 135–145)

## 2020-09-09 LAB — BLOOD GAS, ARTERIAL
Acid-base deficit: 1.7 mmol/L (ref 0.0–2.0)
Bicarbonate: 24.7 mmol/L (ref 20.0–28.0)
FIO2: 100
O2 Saturation: 96.7 %
Patient temperature: 36
pCO2 arterial: 56.3 mmHg — ABNORMAL HIGH (ref 32.0–48.0)
pH, Arterial: 7.258 — ABNORMAL LOW (ref 7.350–7.450)
pO2, Arterial: 100 mmHg (ref 83.0–108.0)

## 2020-09-09 LAB — TYPE AND SCREEN
ABO/RH(D): B POS
Antibody Screen: NEGATIVE
Unit division: 0
Unit division: 0

## 2020-09-09 LAB — BPAM RBC
Blood Product Expiration Date: 202112042359
Blood Product Expiration Date: 202112052359
ISSUE DATE / TIME: 202111091806
ISSUE DATE / TIME: 202111100610
Unit Type and Rh: 7300
Unit Type and Rh: 7300

## 2020-09-09 LAB — CBC
HCT: 22.3 % — ABNORMAL LOW (ref 36.0–46.0)
Hemoglobin: 7.5 g/dL — ABNORMAL LOW (ref 12.0–15.0)
MCH: 26.2 pg (ref 26.0–34.0)
MCHC: 33.6 g/dL (ref 30.0–36.0)
MCV: 78 fL — ABNORMAL LOW (ref 80.0–100.0)
Platelets: 65 10*3/uL — ABNORMAL LOW (ref 150–400)
RBC: 2.86 MIL/uL — ABNORMAL LOW (ref 3.87–5.11)
RDW: 15.7 % — ABNORMAL HIGH (ref 11.5–15.5)
WBC: 23.7 10*3/uL — ABNORMAL HIGH (ref 4.0–10.5)
nRBC: 1.3 % — ABNORMAL HIGH (ref 0.0–0.2)

## 2020-09-09 LAB — TRIGLYCERIDES: Triglycerides: 601 mg/dL — ABNORMAL HIGH (ref <150)

## 2020-09-09 LAB — COMPREHENSIVE METABOLIC PANEL
ALT: 2573 U/L — ABNORMAL HIGH (ref 0–44)
AST: 6033 U/L — ABNORMAL HIGH (ref 15–41)
Albumin: 1.8 g/dL — ABNORMAL LOW (ref 3.5–5.0)
Alkaline Phosphatase: 105 U/L (ref 38–126)
Anion gap: 9 (ref 5–15)
BUN: 52 mg/dL — ABNORMAL HIGH (ref 8–23)
CO2: 22 mmol/L (ref 22–32)
Calcium: 7.4 mg/dL — ABNORMAL LOW (ref 8.9–10.3)
Chloride: 105 mmol/L (ref 98–111)
Creatinine, Ser: 1.65 mg/dL — ABNORMAL HIGH (ref 0.44–1.00)
GFR, Estimated: 33 mL/min — ABNORMAL LOW (ref >60)
Glucose, Bld: 233 mg/dL — ABNORMAL HIGH (ref 70–99)
Potassium: 4.1 mmol/L (ref 3.5–5.1)
Sodium: 136 mmol/L (ref 135–145)
Total Bilirubin: 1.2 mg/dL (ref 0.3–1.2)
Total Protein: 3.9 g/dL — ABNORMAL LOW (ref 6.5–8.1)

## 2020-09-09 LAB — MAGNESIUM: Magnesium: 2.2 mg/dL (ref 1.7–2.4)

## 2020-09-09 LAB — GLUCOSE, CAPILLARY
Glucose-Capillary: 164 mg/dL — ABNORMAL HIGH (ref 70–99)
Glucose-Capillary: 170 mg/dL — ABNORMAL HIGH (ref 70–99)
Glucose-Capillary: 172 mg/dL — ABNORMAL HIGH (ref 70–99)
Glucose-Capillary: 182 mg/dL — ABNORMAL HIGH (ref 70–99)
Glucose-Capillary: 182 mg/dL — ABNORMAL HIGH (ref 70–99)
Glucose-Capillary: 184 mg/dL — ABNORMAL HIGH (ref 70–99)
Glucose-Capillary: 187 mg/dL — ABNORMAL HIGH (ref 70–99)
Glucose-Capillary: 192 mg/dL — ABNORMAL HIGH (ref 70–99)
Glucose-Capillary: 198 mg/dL — ABNORMAL HIGH (ref 70–99)
Glucose-Capillary: 199 mg/dL — ABNORMAL HIGH (ref 70–99)
Glucose-Capillary: 211 mg/dL — ABNORMAL HIGH (ref 70–99)
Glucose-Capillary: 213 mg/dL — ABNORMAL HIGH (ref 70–99)
Glucose-Capillary: 238 mg/dL — ABNORMAL HIGH (ref 70–99)
Glucose-Capillary: 258 mg/dL — ABNORMAL HIGH (ref 70–99)
Glucose-Capillary: 268 mg/dL — ABNORMAL HIGH (ref 70–99)
Glucose-Capillary: 287 mg/dL — ABNORMAL HIGH (ref 70–99)

## 2020-09-09 LAB — DIC (DISSEMINATED INTRAVASCULAR COAGULATION)PANEL
D-Dimer, Quant: 5.97 ug/mL-FEU — ABNORMAL HIGH (ref 0.00–0.50)
Fibrinogen: 380 mg/dL (ref 210–475)
INR: 1.9 — ABNORMAL HIGH (ref 0.8–1.2)
Platelets: 68 10*3/uL — ABNORMAL LOW (ref 150–400)
Prothrombin Time: 21.2 seconds — ABNORMAL HIGH (ref 11.4–15.2)
Smear Review: NONE SEEN
aPTT: 46 seconds — ABNORMAL HIGH (ref 24–36)

## 2020-09-09 LAB — PROCALCITONIN: Procalcitonin: 12.02 ng/mL

## 2020-09-09 LAB — PHOSPHORUS: Phosphorus: 4.6 mg/dL (ref 2.5–4.6)

## 2020-09-09 LAB — BRAIN NATRIURETIC PEPTIDE: B Natriuretic Peptide: 609.7 pg/mL — ABNORMAL HIGH (ref 0.0–100.0)

## 2020-09-09 MED ORDER — INSULIN ASPART 100 UNIT/ML ~~LOC~~ SOLN
0.0000 [IU] | SUBCUTANEOUS | Status: DC
  Administered 2020-09-09: 7 [IU] via SUBCUTANEOUS
  Administered 2020-09-09: 5 [IU] via SUBCUTANEOUS
  Administered 2020-09-09: 4 [IU] via SUBCUTANEOUS
  Administered 2020-09-09: 11 [IU] via SUBCUTANEOUS
  Administered 2020-09-10: 4 [IU] via SUBCUTANEOUS
  Administered 2020-09-10: 11 [IU] via SUBCUTANEOUS
  Administered 2020-09-10: 4 [IU] via SUBCUTANEOUS
  Administered 2020-09-11: 3 [IU] via SUBCUTANEOUS
  Administered 2020-09-11 (×2): 4 [IU] via SUBCUTANEOUS
  Administered 2020-09-11: 7 [IU] via SUBCUTANEOUS
  Administered 2020-09-11 (×2): 4 [IU] via SUBCUTANEOUS
  Administered 2020-09-12: 3 [IU] via SUBCUTANEOUS
  Administered 2020-09-12 (×3): 4 [IU] via SUBCUTANEOUS
  Administered 2020-09-12 (×2): 3 [IU] via SUBCUTANEOUS
  Administered 2020-09-13: 4 [IU] via SUBCUTANEOUS
  Administered 2020-09-13: 7 [IU] via SUBCUTANEOUS
  Administered 2020-09-13: 4 [IU] via SUBCUTANEOUS
  Administered 2020-09-13: 7 [IU] via SUBCUTANEOUS
  Administered 2020-09-13: 4 [IU] via SUBCUTANEOUS
  Administered 2020-09-13 – 2020-09-14 (×2): 7 [IU] via SUBCUTANEOUS
  Administered 2020-09-14: 4 [IU] via SUBCUTANEOUS
  Administered 2020-09-14 – 2020-09-15 (×3): 3 [IU] via SUBCUTANEOUS
  Administered 2020-09-16: 4 [IU] via SUBCUTANEOUS
  Administered 2020-09-16: 3 [IU] via SUBCUTANEOUS
  Administered 2020-09-16 (×2): 4 [IU] via SUBCUTANEOUS
  Administered 2020-09-17 (×2): 3 [IU] via SUBCUTANEOUS
  Administered 2020-09-17: 4 [IU] via SUBCUTANEOUS
  Administered 2020-09-17 (×3): 3 [IU] via SUBCUTANEOUS
  Administered 2020-09-17: 4 [IU] via SUBCUTANEOUS
  Administered 2020-09-18: 3 [IU] via SUBCUTANEOUS
  Administered 2020-09-18: 4 [IU] via SUBCUTANEOUS
  Administered 2020-09-18 (×3): 3 [IU] via SUBCUTANEOUS
  Administered 2020-09-19 (×6): 4 [IU] via SUBCUTANEOUS
  Administered 2020-09-20: 3 [IU] via SUBCUTANEOUS
  Administered 2020-09-20: 7 [IU] via SUBCUTANEOUS
  Administered 2020-09-20: 4 [IU] via SUBCUTANEOUS
  Administered 2020-09-20 (×2): 3 [IU] via SUBCUTANEOUS
  Administered 2020-09-21: 11 [IU] via SUBCUTANEOUS
  Administered 2020-09-21: 3 [IU] via SUBCUTANEOUS
  Administered 2020-09-21: 4 [IU] via SUBCUTANEOUS
  Administered 2020-09-22: 7 [IU] via SUBCUTANEOUS
  Administered 2020-09-22: 4 [IU] via SUBCUTANEOUS
  Administered 2020-09-22: 3 [IU] via SUBCUTANEOUS
  Administered 2020-09-22: 4 [IU] via SUBCUTANEOUS
  Administered 2020-09-22 – 2020-09-23 (×3): 7 [IU] via SUBCUTANEOUS
  Administered 2020-09-23 – 2020-09-24 (×2): 3 [IU] via SUBCUTANEOUS
  Administered 2020-09-24 (×2): 4 [IU] via SUBCUTANEOUS
  Administered 2020-09-24: 3 [IU] via SUBCUTANEOUS
  Administered 2020-09-25 (×2): 4 [IU] via SUBCUTANEOUS
  Administered 2020-09-25 – 2020-09-26 (×5): 3 [IU] via SUBCUTANEOUS
  Administered 2020-09-27: 20 [IU] via SUBCUTANEOUS
  Administered 2020-09-27: 3 [IU] via SUBCUTANEOUS
  Administered 2020-09-27: 4 [IU] via SUBCUTANEOUS
  Administered 2020-09-27 – 2020-09-28 (×2): 3 [IU] via SUBCUTANEOUS
  Administered 2020-09-28: 4 [IU] via SUBCUTANEOUS
  Administered 2020-09-28: 11 [IU] via SUBCUTANEOUS
  Administered 2020-09-28: 4 [IU] via SUBCUTANEOUS
  Administered 2020-09-29 (×4): 3 [IU] via SUBCUTANEOUS
  Administered 2020-09-30 (×2): 7 [IU] via SUBCUTANEOUS
  Administered 2020-09-30 (×2): 4 [IU] via SUBCUTANEOUS
  Administered 2020-09-30 (×2): 7 [IU] via SUBCUTANEOUS
  Administered 2020-10-01: 4 [IU] via SUBCUTANEOUS
  Administered 2020-10-01: 7 [IU] via SUBCUTANEOUS
  Administered 2020-10-01 (×2): 3 [IU] via SUBCUTANEOUS
  Administered 2020-10-01 (×2): 4 [IU] via SUBCUTANEOUS
  Administered 2020-10-02: 3 [IU] via SUBCUTANEOUS
  Administered 2020-10-02: 0 [IU] via SUBCUTANEOUS
  Administered 2020-10-02: 7 [IU] via SUBCUTANEOUS
  Administered 2020-10-02: 3 [IU] via SUBCUTANEOUS
  Administered 2020-10-02: 7 [IU] via SUBCUTANEOUS
  Administered 2020-10-03: 3 [IU] via SUBCUTANEOUS
  Administered 2020-10-03: 4 [IU] via SUBCUTANEOUS
  Administered 2020-10-03: 7 [IU] via SUBCUTANEOUS
  Administered 2020-10-03 – 2020-10-04 (×2): 4 [IU] via SUBCUTANEOUS
  Administered 2020-10-04: 3 [IU] via SUBCUTANEOUS
  Administered 2020-10-05 (×2): 7 [IU] via SUBCUTANEOUS
  Administered 2020-10-06: 2 [IU] via SUBCUTANEOUS
  Administered 2020-10-06: 4 [IU] via SUBCUTANEOUS
  Administered 2020-10-06 (×2): 3 [IU] via SUBCUTANEOUS
  Administered 2020-10-07 (×2): 4 [IU] via SUBCUTANEOUS
  Administered 2020-10-07: 3 [IU] via SUBCUTANEOUS
  Administered 2020-10-07: 4 [IU] via SUBCUTANEOUS
  Administered 2020-10-08: 0 [IU] via SUBCUTANEOUS
  Administered 2020-10-08: 3 [IU] via SUBCUTANEOUS
  Administered 2020-10-08 (×2): 4 [IU] via SUBCUTANEOUS
  Administered 2020-10-08: 3 [IU] via SUBCUTANEOUS
  Administered 2020-10-08: 4 [IU] via SUBCUTANEOUS
  Administered 2020-10-09: 12:00:00 3 [IU] via SUBCUTANEOUS
  Administered 2020-10-09: 08:00:00 4 [IU] via SUBCUTANEOUS
  Administered 2020-10-09 (×2): 3 [IU] via SUBCUTANEOUS

## 2020-09-09 MED ORDER — ACETAMINOPHEN 160 MG/5ML PO SOLN
650.0000 mg | Freq: Four times a day (QID) | ORAL | Status: DC | PRN
  Administered 2020-09-22 – 2020-11-14 (×15): 650 mg
  Filled 2020-09-09 (×15): qty 20.3

## 2020-09-09 MED ORDER — INSULIN ASPART 100 UNIT/ML ~~LOC~~ SOLN
10.0000 [IU] | SUBCUTANEOUS | Status: DC
  Administered 2020-09-09 – 2020-09-21 (×61): 10 [IU] via SUBCUTANEOUS

## 2020-09-09 MED ORDER — TRAVASOL 10 % IV SOLN
INTRAVENOUS | Status: AC
  Filled 2020-09-09: qty 78

## 2020-09-09 MED ORDER — PIVOT 1.5 CAL PO LIQD
1000.0000 mL | ORAL | Status: DC
  Administered 2020-09-09: 1000 mL
  Filled 2020-09-09 (×2): qty 1000

## 2020-09-09 MED ORDER — INSULIN DETEMIR 100 UNIT/ML ~~LOC~~ SOLN
5.0000 [IU] | Freq: Once | SUBCUTANEOUS | Status: AC
  Administered 2020-09-09: 5 [IU] via SUBCUTANEOUS
  Filled 2020-09-09: qty 0.05

## 2020-09-09 MED ORDER — INSULIN DETEMIR 100 UNIT/ML ~~LOC~~ SOLN
30.0000 [IU] | Freq: Two times a day (BID) | SUBCUTANEOUS | Status: DC
  Administered 2020-09-09 – 2020-09-13 (×8): 30 [IU] via SUBCUTANEOUS
  Filled 2020-09-09 (×10): qty 0.3

## 2020-09-09 MED ORDER — PANTOPRAZOLE SODIUM 40 MG PO PACK
40.0000 mg | PACK | Freq: Two times a day (BID) | ORAL | Status: DC
  Administered 2020-09-09 – 2020-11-18 (×139): 40 mg
  Filled 2020-09-09 (×139): qty 20

## 2020-09-09 MED ORDER — PRISMASOL BGK 4/2.5 32-4-2.5 MEQ/L EC SOLN
Status: DC
  Filled 2020-09-09 (×44): qty 5000

## 2020-09-09 NOTE — Progress Notes (Signed)
PT Cancellation Note  Patient Details Name: Candace [name] MRN: [id number] DOB: November 05, 1950   Cancelled Treatment:    Reason Eval/Treat Not Completed: Medical issues which prohibited therapy.  Pt intubated, sedated, proned per chart review.  Pt also on significant levels of FiO2 100% and PEEP 14.  Pt starting CRRT for kidney failure/injury.  PT will follow along for further medical stability.   Thanks,  Verdene Lennert, PT, DPT  Acute Rehabilitation [phone] pager #[phone] office      Wells Guiles B Tiffanie Blassingame 09/09/2020, 12:28 PM

## 2020-09-09 NOTE — Progress Notes (Signed)
  Updated daughter, [name] by phone on her mother's condition, some small improvements noted today, including her decreasing pressor requirement, but overall remains very critically ill with multisystem organ failure.  Her children are anxious to visit given how sick she is and were anticipating her coming off of airborne and contact precautions on day 21, which is today based on her positive urgent care test.  She had confirmed positive test here on 10/25.  However, given how critically ill she remains, precautions will be continued for now.  Family were upset with the conflicting information provided to them thus far.   I did speak with Dr. Wilder Glade with ID to help with guidance.  Advised when patient starts to have clinical improvement, we could repeat COVID testing - to have two negative test, with 24hrs between test, prior to coming off precautions.        Kennieth Rad, ACNP [facility] 09/09/2020, 1:53 PM  See Amion for personal pager PCCM on call pager [phone]

## 2020-09-09 NOTE — Progress Notes (Signed)
PHARMACY - TOTAL PARENTERAL NUTRITION CONSULT NOTE  Indication:  Intolerance to EN  Patient Measurements: Height: 5\' 8"  (172.7 cm) Weight: 91.9 kg (202 lb 9.6 oz) IBW/kg (Calculated) : 63.9 TPN AdjBW (KG): 72.9 Body mass index is 30.81 kg/m.  Assessment:  8 YOF recently admitted (10/25 > 10/31) with Covid-19 and discharged home on supplemental oxygen and on Eliquis for elevated d-dimer. She returned to the ED for elevated heart rate, found to be in Afib RVR.  She developed abdominal pain that progressively got worse and CT shows bowel perforation, pneumoperitoneum and intermediate density free fluid in the abdomen. Of note, patient has a prior h/o partial colectomy/colostomy for colon cancer, completion colectomy and ileostomy. S/pex-lap with LoA, modified Graham patch repair of perforated duodenal ulceron 09/01/20.  Pharmacy consulted to manage TPN due to baseline malnutrition and likely prolonged inability to tolerate PO feeds.   Patient transferred to ICU on 11/9 and required intubation also with concern of bleed. Patient is currently sedated on cisatracurium, fentanyl, and propofol. Plan per CCM to change propofol to midazolam. Patient is also currently requiring vasopressor support with norepinephrine and vasopressin. Scr has increased and UOP decreased with possible need for CRRT pending clinical course. Patient also started on empiric antibiotics for concern of PNA.   Glucose / Insulin: hx DM . On insulin gtt Electrolytes: Na 136, K 4.1 Renal: On CRRT LFTs / TGs: ASTG 6033 ALT 2573 Trigs 601 Prealbumin / albumin: prealbumin WNL at 26.3, albumin 1.7 Intake / Output; MIVF: CRRT GI Imaging:  - 11/9 abd xray: no bowel obstruction -11/10 abd ct - neg for abscess Surgeries / Procedures: none since TPN  Central access: triple lumen PICC placed 09/03/20 TPN start date: 09/03/20  Nutritional Goals (per RD on 11/5): kCal: 2200-2450, Protein: 115-130gms , Fluid >/= 2L/day Goal TPN rate =  90 ml/hr (57 g/L AA, 16% CHO and 63 g/L ILE) = 123g AA and 2294 kCal  Current Nutrition:  TPN TF @ 20 / hr  Plan:  DC TPN - nurse to run at 40 mL/hr x 3 hr then stop Advance TF to 30 mL/hr  Barth Kirks, PharmD, BCPS, BCCCP Clinical Pharmacist [phone]  Please check AMION for all [place] numbers  09/09/2020 8:53 AM

## 2020-09-09 NOTE — Progress Notes (Signed)
Attempted video chat x 4 numbers, waited 9 minutes with no response. RN at [facility]sdie aware

## 2020-09-09 NOTE — Progress Notes (Signed)
[facility] NEPHROLOGY PROGRESS NOTE  Assessment/ Plan: Pt is a 69 y.o. yo female with history of DM, HTN, HLD, colon cancer status post partial colectomy, colostomy who was admitted on 11/2 with A. fib with RVR, Covid pneumonia, found to have perforated duodenal ulcer and underwent exploratory laparotomy surgery, developed hemorrhagic shock, respiratory failure seen as a consultation for acute kidney injury and hyperkalemia.   #Acute kidney injury, oliguric likely ATN due to hemorrhagic shock: Patient initially had AKI with creatinine level of 5.6 on admission due to A. fib with RVR/hemodynamic instability which was improved.  However she developed another episode of AKI after shock.  Urinalysis was contaminated/UTI. CT scan A/P without hydronephrosis. Started CRRT on 11/10 because of oliguria and hyperkalemia.  Tolerating well.  Potassium level improved therefore changed dialysate potassium bath to 4K.  Discontinue IV fluid.  #Hyperkalemia due to AKI and intra-abdominal bleeding/PRBC transfusion: Now managed with CRRT . #Hemorrhagic shock: Received blood transfusion.  Currently on vasopressin and Levophed. Maintain MAP >65.   #Perforated duodenal ulcer status post ex lap: General surgery is following.  Currently on TPN.  #ARDS/recent Covid pneumonia: Currently on mechanical ventilation, prone position.  Per primary team.  Subjective: Seen and examined at ICU in Covid isolation.  She is sedated, on mechanical ventilation and on prone position.  Not much urine output.  Tolerating CRRT well. Objective Vital signs in last 24 hours: Vitals:   09/09/20 0737 09/09/20 0800 09/09/20 0830 09/09/20 0900  BP:  (!) 103/57 (!) 100/50 (!) 89/49  Pulse: 62 64 65 67  Resp: (!) 26 (!) 26 (!) 26 (!) 26  Temp:      TempSrc:      SpO2: 98% 97% 96% 95%  Weight:      Height:       Weight change:   Intake/Output Summary (Last 24 hours) at 09/09/2020 0919 Last data filed at 09/09/2020  0900 Gross per 24 hour  Intake 6540.09 ml  Output 1706 ml  Net 4834.09 ml       Labs: Basic Metabolic Panel: Recent Labs  Lab 09/07/20 0118 09/07/20 1625 09/08/20 0446 09/08/20 1000 09/08/20 1800 09/08/20 1800 09/08/20 1802 09/08/20 2218 09/09/20 0428  NA 146*   < > 144   < > 138   < > 140 136 136  136  K 4.3   < > 6.0*   < > 6.4*   < > 6.3* 5.4* 4.1  4.0  CL 115*   < > 113*   < > 109  --   --  106 105  106  CO2 25   < > 16*   < > 18*  --   --  19* 22  21*  GLUCOSE 263*   < > 139*   < > 375*  --   --  350* 233*  231*  BUN 26*   < > 39*   < > 60*  --   --  63* 52*  52*  CREATININE 0.75   < > 1.71*   < > 2.34*  --   --  2.06* 1.65*  1.60*  CALCIUM 9.7   < > 9.2   < > 8.3*  --   --  8.0* 7.4*  7.4*  PHOS 2.5  --  8.6*  --   --   --   --   --  4.6  4.4   < > = values in this interval not displayed.   Liver Function Tests:  Recent Labs  Lab 09/06/20 0320 09/07/20 0118 09/09/20 0428  AST 60* 59* 6,033*  ALT 43 56* 2,573*  ALKPHOS 114 85 105  BILITOT 0.1* 0.6 1.2  PROT 5.1* 4.9* 3.9*  ALBUMIN 1.8* 1.7* 1.8*  1.8*   No results for input(s): LIPASE, AMYLASE in the last 168 hours. No results for input(s): AMMONIA in the last 168 hours. CBC: Recent Labs  Lab 09/04/20 0514 09/05/20 0355 09/06/20 0320 09/06/20 0320 09/07/20 0118 09/07/20 0118 09/07/20 1625 09/07/20 2159 09/07/20 2202 09/08/20 0148 09/08/20 0446 09/08/20 1007 09/08/20 1136 09/08/20 1802 09/09/20 0428  WBC 12.4*   < > 11.0*   < > 12.5*   < > 20.2*  --  34.5*  --  28.7*  --   --   --  23.7*  NEUTROABS 11.7*  --  9.6*  --   --   --   --   --   --   --   --   --   --   --   --   HGB 10.1*   < > 9.9*   < > 9.2*   < > 6.9*   < > 8.8*   < > 7.8*   < > 7.8* 7.1* 7.5*  HCT 32.7*   < > 33.1*   < > 30.4*   < > 22.9*   < > 28.3*   < > 24.1*   < > 23.6* 21.0* 22.3*  MCV 75.2*   < > 75.7*   < > 76.2*  --  76.8*  --  81.6  --  81.1  --   --   --  78.0*  PLT 291   < > 248   < > 233   < > 216  --   201  --  168  --   --   --  65*   < > = values in this interval not displayed.   Cardiac Enzymes: No results for input(s): CKTOTAL, CKMB, CKMBINDEX, TROPONINI in the last 168 hours. CBG: Recent Labs  Lab 09/09/20 0509 09/09/20 0612 09/09/20 0701 09/09/20 0805 09/09/20 0856  GLUCAP 198* 199* 187* 172* 192*    Iron Studies: No results for input(s): IRON, TIBC, TRANSFERRIN, FERRITIN in the last 72 hours. Studies/Results: CT ABDOMEN PELVIS WO CONTRAST  Result Date: 09/08/2020 CLINICAL DATA:  Sepsis.  COVID pneumonia.  Retroperitoneal hematoma. EXAM: CT CHEST, ABDOMEN AND PELVIS WITHOUT CONTRAST TECHNIQUE: Multidetector CT imaging of the chest, abdomen and pelvis was performed following the standard protocol without IV contrast. COMPARISON:  September 01, 2020 FINDINGS: CT CHEST FINDINGS Cardiovascular: The heart size is unremarkable. There are coronary artery calcifications. There are atherosclerotic changes of the thoracic aorta. The intracardiac blood pool is hypodense relative to the adjacent myocardium consistent with anemia. Central venous catheters are noted, all of which terminate near the cavoatrial junction. Mediastinum/Nodes: -- No mediastinal lymphadenopathy. There are few pockets of gas in the anterior mediastinum favored to represent gas within venous structures. -- No hilar lymphadenopathy. -- No axillary lymphadenopathy. -- No supraclavicular lymphadenopathy. -- Normal thyroid gland where visualized. -  Unremarkable esophagus. Lungs/Pleura: There are diffuse bilateral hazy ground-glass airspace opacities with more focal areas of consolidation at the lung bases. These have progressed since the prior study. The endotracheal tube terminates above the carina. There is no pneumothorax. There are trace bilateral pleural effusions. Musculoskeletal: No chest wall abnormality. No bony spinal canal stenosis. CT ABDOMEN PELVIS FINDINGS Hepatobiliary: The liver is normal. The gallbladder is  distended and filled with hyperdense material, which may represent vicarious excretion of contrast or gallbladder sludge.There is no biliary ductal dilation. Pancreas: There is a cystic appearing lesion measuring approximately 1.9 cm located in the pancreatic body (axial series 3, image 62). This was not present on the patient's CT from 2015. Spleen: Unremarkable. Adrenals/Urinary Tract: --Adrenal glands: There is stable thickening of the right adrenal gland. There is an unchanged left adrenal nodule. --Right kidney/ureter: Multiple peripelvic cysts are noted involving the right kidney. There is no frank hydronephrosis. --Left kidney/ureter: There is no left-sided hydronephrosis. --Urinary bladder: The bladder is decompressed by Foley catheter. Stomach/Bowel: --Stomach/Duodenum: There is an NG tube in place. --Small bowel: There is a right lower quadrant ileostomy. --Colon: The patient is status post prior colectomy. --Appendix: Surgically absent. Vascular/Lymphatic: Atherosclerotic calcification is present within the non-aneurysmal abdominal aorta, without hemodynamically significant stenosis. There is a partially visualized arterial line in the right inguinal region. --No retroperitoneal lymphadenopathy. --No mesenteric lymphadenopathy. --No pelvic or inguinal lymphadenopathy. Reproductive: The endometrial stripe appears diffusely thickened. There is a small amount of free fluid the patient's pelvis. Other: A surgical drain is no new right upper quadrant. There is no definite abscess. No significant free air. There is an open midline incision. Musculoskeletal. No acute displaced fractures. IMPRESSION: 1. Interval progression of diffuse bilateral hazy ground-glass airspace opacities with more focal areas of consolidation at the lung bases 2. Trace bilateral pleural effusions. 3. Postsurgical changes the abdomen as detailed above. No evidence for a postoperative abscess, however evaluation is limited by lack of IV  contrast. 4. There is a 1.9 cm cystic appearing lesion located in the pancreatic body. This was not present on the patient's CT from 2015. Follow-up with an outpatient contrast enhanced MRI is recommended. 5. The endometrial stripe appears diffusely thickened. Follow-up with pelvic ultrasound is recommended. Aortic Atherosclerosis (ICD10-I70.0). Electronically Signed   By: Constance Holster M.D.   On: 09/08/2020 19:53   DG Abd 1 View  Result Date: 09/07/2020 CLINICAL DATA:  Nausea EXAM: ABDOMEN - 1 VIEW COMPARISON:  September 06, 2020 FINDINGS: Drain again noted in lateral right abdomen. No bowel dilatation or air-fluid level to suggest bowel obstruction. No free air. Probable phleboliths in the pelvis. Nasogastric tube no longer present. IMPRESSION: No bowel obstruction or free air evident on supine examination. Drain in lateral right abdomen again noted. Electronically Signed   By: Lowella Grip III M.D.   On: 09/07/2020 17:28   CT CHEST WO CONTRAST  Result Date: 09/08/2020 CLINICAL DATA:  Sepsis.  COVID pneumonia.  Retroperitoneal hematoma. EXAM: CT CHEST, ABDOMEN AND PELVIS WITHOUT CONTRAST TECHNIQUE: Multidetector CT imaging of the chest, abdomen and pelvis was performed following the standard protocol without IV contrast. COMPARISON:  September 01, 2020 FINDINGS: CT CHEST FINDINGS Cardiovascular: The heart size is unremarkable. There are coronary artery calcifications. There are atherosclerotic changes of the thoracic aorta. The intracardiac blood pool is hypodense relative to the adjacent myocardium consistent with anemia. Central venous catheters are noted, all of which terminate near the cavoatrial junction. Mediastinum/Nodes: -- No mediastinal lymphadenopathy. There are few pockets of gas in the anterior mediastinum favored to represent gas within venous structures. -- No hilar lymphadenopathy. -- No axillary lymphadenopathy. -- No supraclavicular lymphadenopathy. -- Normal thyroid gland where  visualized. -  Unremarkable esophagus. Lungs/Pleura: There are diffuse bilateral hazy ground-glass airspace opacities with more focal areas of consolidation at the lung bases. These have progressed since the prior study. The endotracheal tube terminates above the  carina. There is no pneumothorax. There are trace bilateral pleural effusions. Musculoskeletal: No chest wall abnormality. No bony spinal canal stenosis. CT ABDOMEN PELVIS FINDINGS Hepatobiliary: The liver is normal. The gallbladder is distended and filled with hyperdense material, which may represent vicarious excretion of contrast or gallbladder sludge.There is no biliary ductal dilation. Pancreas: There is a cystic appearing lesion measuring approximately 1.9 cm located in the pancreatic body (axial series 3, image 62). This was not present on the patient's CT from 2015. Spleen: Unremarkable. Adrenals/Urinary Tract: --Adrenal glands: There is stable thickening of the right adrenal gland. There is an unchanged left adrenal nodule. --Right kidney/ureter: Multiple peripelvic cysts are noted involving the right kidney. There is no frank hydronephrosis. --Left kidney/ureter: There is no left-sided hydronephrosis. --Urinary bladder: The bladder is decompressed by Foley catheter. Stomach/Bowel: --Stomach/Duodenum: There is an NG tube in place. --Small bowel: There is a right lower quadrant ileostomy. --Colon: The patient is status post prior colectomy. --Appendix: Surgically absent. Vascular/Lymphatic: Atherosclerotic calcification is present within the non-aneurysmal abdominal aorta, without hemodynamically significant stenosis. There is a partially visualized arterial line in the right inguinal region. --No retroperitoneal lymphadenopathy. --No mesenteric lymphadenopathy. --No pelvic or inguinal lymphadenopathy. Reproductive: The endometrial stripe appears diffusely thickened. There is a small amount of free fluid the patient's pelvis. Other: A surgical drain  is no new right upper quadrant. There is no definite abscess. No significant free air. There is an open midline incision. Musculoskeletal. No acute displaced fractures. IMPRESSION: 1. Interval progression of diffuse bilateral hazy ground-glass airspace opacities with more focal areas of consolidation at the lung bases 2. Trace bilateral pleural effusions. 3. Postsurgical changes the abdomen as detailed above. No evidence for a postoperative abscess, however evaluation is limited by lack of IV contrast. 4. There is a 1.9 cm cystic appearing lesion located in the pancreatic body. This was not present on the patient's CT from 2015. Follow-up with an outpatient contrast enhanced MRI is recommended. 5. The endometrial stripe appears diffusely thickened. Follow-up with pelvic ultrasound is recommended. Aortic Atherosclerosis (ICD10-I70.0). Electronically Signed   By: Constance Holster M.D.   On: 09/08/2020 19:53   DG Chest Port 1 View  Result Date: 09/08/2020 CLINICAL DATA:  69 year old female with central line placement. EXAM: PORTABLE CHEST 1 VIEW COMPARISON:  Earlier chest radiograph dated 09/08/2020. FINDINGS: Interval placement of a right IJ central venous line with tip over central SVC close to the cavoatrial junction. No pneumothorax. Right-sided PICC, endotracheal tube, and enteric tube as well as left subclavian central venous line appear in similar position. Bilateral pulmonary opacities predominantly involving the mid to lower lung field again noted. No large pleural effusion. Stable cardiac silhouette. No acute osseous pathology. Degenerative changes of the left shoulder. IMPRESSION: Interval placement of a right IJ central venous line with tip over central SVC close to the cavoatrial junction. No pneumothorax. Electronically Signed   By: Anner Crete M.D.   On: 09/08/2020 16:49   DG Chest Port 1 View  Result Date: 09/08/2020 CLINICAL DATA:  ARDS. EXAM: PORTABLE CHEST 1 VIEW COMPARISON:   September 07, 2020. FINDINGS: Stable cardiomediastinal silhouette. Endotracheal and nasogastric tubes are in good position. Left subclavian catheter is unchanged. Right-sided PICC line is unchanged. Stable bilateral lung opacities are noted consistent multifocal pneumonia. No pneumothorax is noted. Small bilateral pleural effusions are noted. Bony thorax is unremarkable. IMPRESSION: Stable bilateral lung opacities are noted consistent with multifocal pneumonia. Small bilateral pleural effusions are noted. Electronically Signed   By: Jeneen Rinks  Murlean Caller M.D.   On: 09/08/2020 08:01   DG CHEST PORT 1 VIEW  Result Date: 09/07/2020 CLINICAL DATA:  COVID-19 positive, pneumonia, intubated EXAM: PORTABLE CHEST 1 VIEW COMPARISON:  09/07/2020 at 5:08 p.m. FINDINGS: Single frontal view of the chest demonstrates endotracheal tube overlying tracheal air column, tip midway between thoracic inlet and carina. Enteric catheter passes below diaphragm tip excluded by collimation. Right-sided PICC and left subclavian central venous catheter projects over the superior vena cava. Cardiac silhouette is stable. There is progressive multifocal basilar predominant airspace disease consistent with pneumonia. No effusion or pneumothorax. IMPRESSION: 1. Progressive bibasilar airspace disease consistent with multifocal pneumonia. 2. Support devices as above. Electronically Signed   By: Randa Ngo M.D.   On: 09/07/2020 22:37   DG Chest Port 1 View  Result Date: 09/07/2020 CLINICAL DATA:  Nausea with cough and shortness of breath. Reported recent COVID-19 positive EXAM: PORTABLE CHEST 1 VIEW COMPARISON:  September 06, 2020. FINDINGS: Central catheter tip is in the superior vena cava. Nasogastric tube no longer present. Airspace opacity is noted in the mid and lower lung regions, similar to 1 day prior. Heart is upper normal in size with pulmonary vascularity normal. No adenopathy. There is aortic atherosclerosis. Arthropathy in the left  shoulder again noted. IMPRESSION: Multifocal airspace opacity consistent with multifocal pneumonia. Stable cardiac silhouette. No adenopathy evident. Aortic Atherosclerosis (ICD10-I70.0). Electronically Signed   By: Lowella Grip III M.D.   On: 09/07/2020 17:27   DG Abd Portable 1V  Result Date: 09/07/2020 CLINICAL DATA:  Enteric catheter placement EXAM: PORTABLE ABDOMEN - 1 VIEW COMPARISON:  09/07/2020 at 5:11 p.m. FINDINGS: Frontal view of the abdomen demonstrates surgical drain overlying the right mid abdomen. Enteric catheter coiled back upon itself, tip in the region of the gastric antrum. Bowel gas pattern is unremarkable. IMPRESSION: 1. Enteric catheter overlying gastric antrum. Electronically Signed   By: Randa Ngo M.D.   On: 09/07/2020 22:40    Medications: Infusions: . ceFEPime (MAXIPIME) IV Stopped (09/08/20 2244)  . cisatracurium (NIMBEX) infusion 2 mg/mL 3 mcg/kg/min (09/09/20 0900)  . fentaNYL infusion INTRAVENOUS 300 mcg/hr (09/09/20 0900)  . insulin 8 mL/hr at 09/09/20 0900  . midazolam 10 mg/hr (09/09/20 0900)  . norepinephrine (LEVOPHED) Adult infusion 11 mcg/min (09/09/20 0900)  . prismasol BGK 2/2.5 replacement solution 500 mL/hr at 09/09/20 0707  . prismasol BGK 2/2.5 replacement solution 300 mL/hr at 09/08/20 2012  . prismasol BGK 4/2.5 1,800 mL/hr at 09/09/20 0800  . TPN ADULT (ION) 40 mL/hr at 09/09/20 0900  . vasopressin Stopped (09/09/20 0659)    Scheduled Medications: . artificial tears  1 application Both Eyes [id number]  . chlorhexidine gluconate (MEDLINE KIT)  15 mL Mouth Rinse BID  . Chlorhexidine Gluconate Cloth  6 each Topical Daily  . docusate  100 mg Per Tube BID  . feeding supplement (PIVOT 1.5 CAL)  1,000 mL Per Tube Q24H  . hydrocortisone sodium succinate  50 mg Intravenous Q6H  . insulin detemir  25 Units Subcutaneous BID  . mouth rinse  15 mL Mouth Rinse 10 times per day  . pantoprazole (PROTONIX) IV  40 mg Intravenous Q12H  . polyethylene  glycol  17 g Per Tube Daily  . vancomycin variable dose per unstable renal function (pharmacist dosing)   Does not apply See admin instructions    have reviewed scheduled and prn medications.  Physical Exam: General: Critically ill looking female intubated, sedated and on prone position Heart:RRR, s1s2 nl Lungs: Coarse breath sound  posteriorly Abdomen:soft,  Extremities:No edema Dialysis Access: Right IJ temporary HD catheter placed by ICU on 11/10.  Yanice Maqueda Prasad Jasmynn Pfalzgraf 09/09/2020,9:19 AM  LOS: 8 days  Pager: [phone]

## 2020-09-09 NOTE — Progress Notes (Signed)
Assisted tele visit to patient with daughter.  Jazmyne Beauchesne McEachran, RN  

## 2020-09-09 NOTE — Progress Notes (Signed)
NAMETalon Witting, MRN:  [id number], DOB:  06/04/1951, LOS: 49 ADMISSION DATE:  08/31/2020, CONSULTATION DATE:  09/07/2020 REFERRING MD:  Dr Candiss Norse, CHIEF COMPLAINT:  Acute resp failure  Brief History   69 year old female who was previously diagnosed with Covid 08/23/2020.  Admitted 11/2 with AF-RVR, found to have a perforated duodenal underwent exploratory laparotomy with Phillip Heal patch placement.  History of present illness   69 year old black female that presented to the emergency room on 08/31/2020 for worsening of hypoxia and atrial fibrillation with RVR.  She was admitted to medical floor and placed on Cardizem infusion as well as Eliquis.  She then developed abdominal pain and was found to have a perforated duodenal ulcer for which she went to the operating room and had exploratory laparotomy, lysis of adhesions, Graham patch placement.  Onpostop day 6,developed bleeding from the surgical site.  Reported to a saturated dressing.  Patient was noted to have a hemoglobin change from 9-6.9.  During the same period time she had waxing waning blood pressure.  Developed hemorrhagic shock requiring pressors, blood transfusion and intubation  Past Medical History  Covid pneumonia Atrial fibrillation CKD stage III Diabetes mellitus Hypertension Colon cancer Hyperlipidemia  Significant Hospital Events   11/2 Admitted  11/3 OR -> perforated duodenal ulcer 10/10 progressive hemorrhagic shock, intubated, transfused, pressors, proned; started on CRRT in PM  Consults:  Cardiology [facility] Nephrology   Procedures:  11/3 Exploratory laparotomy, Phillip Heal patch, lysis of adhesion for duodenal ulceration postop day 6  R PICC 11/5 >> A line 11/9 >> ETT 11/9 >> Lt Fertile CVL 11/9 >> R IJ trialysis >>   Significant Diagnostic Tests:  11/3 CT abd/ pelvis >> 1. Positive for bowel perforation: Pneumoperitoneum and intermediate density free fluid in the abdomen. Prior total colectomy. The specific site of  perforation is unclear-oral contrast present to the proximal jejunum has not obviously leaked. Note that there may be small bowel loops adherent to the ventral abdominal wall along the greater curve of the stomach. 2. Extensive bilateral lower lung pneumonia. No pleural effusion. 3. Other abdominal and pelvic viscera are stable since 2015, including bilateral adrenal adenomas. Chronic renal parapelvic cysts. 4. Aortic Atherosclerosis  11/3 TTE >> EF 70-75%, RV not well visualized, mildly reduced RV systolic function  90/38 CT chest/ abd/ pelvis>> 1. Interval progression of diffuse bilateral hazy ground-glass airspace opacities with more focal areas of consolidation at the lung bases 2. Trace bilateral pleural effusions. 3. Postsurgical changes the abdomen as detailed above. No evidence for a postoperative abscess, however evaluation is limited by lack of IV contrast. 4. There is a 1.9 cm cystic appearing lesion located in the pancreatic body. This was not present on the patient's CT from 2015.  Follow-up with an outpatient contrast enhanced MRI is recommended. 5. The endometrial stripe appears diffusely thickened. Follow-up with pelvic ultrasound is recommended. Aortic Atherosclerosis  11/10 LE doppler studies >>  Micro Data:  11/10 MRSA PCR >> neg 10/10 BC x 2 >>  10/10 trach asp >>  Antimicrobials:  azithro 11/2 >>11/3 Ceftriaxone 11/2  Fluconazole 11/3 Zosyn 11/3 >> 11/7 Vanc 11/10 Cefepime 11/10 >>  Subjective:   Started on CRRT last night, resolved hyperkalemia, currently UF ~ -125, remains oliguric  Off vasopressin, NE at 10 mcg Remains on nimbex, fentanyl and versed gtt and proned.   Hgb remains stable > 7, Platelet drop 168-> 65, and new transaminitis  TPN stopped, has tolerated trickle feeds thus far  Objective   Blood  pressure (!) 89/49, pulse 67, temperature (!) 94.7 F (34.8 C), temperature source Axillary, resp. rate (!) 26, height 5\' 8"  (1.727 m), weight 91.9  kg, SpO2 95 %.    Vent Mode: PRVC FiO2 (%):  [80 %-100 %] 100 % Set Rate:  [26 bmp] 26 bmp Vt Set:  [510 mL] 510 mL PEEP:  [14 cmH20] 14 cmH20 Plateau Pressure:  [31 cmH20-36 cmH20] 35 cmH20   Intake/Output Summary (Last 24 hours) at 09/09/2020 1023 Last data filed at 09/09/2020 0900 Gross per 24 hour  Intake 6352.7 ml  Output 1706 ml  Net 4646.7 ml   Filed Weights   08/31/20 2115 09/08/20 1930 09/09/20 0245  Weight: 99.7 kg 91.9 kg 91.9 kg    Examination: General:  Critically ill female sedated, intubated and paralyzed on MV in NAD on bairhugger  HEENT: MM pink/moist, ETT, unable to visual R pupil given position, left 3/sluggish Neuro: sedated/ paralyzed CV: RRR, NSR on monitor, +1 peripheral pulses PULM:  MV supported breaths, clear, diffuse faint exp wheeze, pPlat 27, driving pressure 14 GI: unable to exam in prone position, right JP drain  181ml/ 24 hrs Extremities: warm/dry, slight generalized edema Skin: no rashes, scattered bruises   No CXR  +5.4L/ net +482 ml UOP 220 ml/ 24 hrs  Assessment & Plan:   Shock, multifactorial,  hemorrhagic and septic - continue to wean NE for MAP goal > 65 - trend Hgb levels, stable thus far, transfuse for Hgb <7 - continue stress dose steroids - continue cefepime, d/c vanc given negative MRSA - trend WBC/ fever curve/ PCT - follow cultures  - CT abd/pelvis 11/10 stable, no evidence of abscess   ARDS COVID pneumonia +/- r/o secondary bacterial component - Continue mechanical ventilation per ARDS protocol - Target TVol 6-8cc/kgIBW-  - Target Plateau Pressure < 30cm H20 - Target driving pressure less than 15 cm of water - Target PaO2 55-65: titrate PEEP/ FiO2 per protocol - As long as PaO2 to FiO2 ratio is less than 1:150 position in prone position for 16 hours a day; plans to go supine today at 1530 - Ventilator associated pneumonia prevention protocol - follow cultura data, continue cefepime as above  - pending ABG today  -  intermittent CXR - low suspicion for PE given she was on Eliquis prior to decompensation, pending LE dopplers to eval for DVT  - continue PAD protocol with fentanyl and versed gtt, ongoing nimbex w/ RASS goal -5  Recent Covid -supportive care - Initial POS test 10/21 at urgent care, can d/c precautions   Acute blood loss anemia - status post 2 units PRBC 11/10 - trend CBC, transfuse for Hgb < 7 - CT a/p 11/10, stable   Atrial fibrillation/RVR  - currently in NSR - anticoagulation on hold  AKI - oliguric likely ATN w/ hyperkalemia - appreciate Nephrology assistance - CRRT per Nephrology  - strict I/Os/ daily wts - trending renal indices   Duodenal perforation/ pneumoperitoneum   - S/pexploratory laparotomy, adhesiolysis, modified graham patch repair of perforated duodenal ulcer11/3; UGI neg for leak 11/8 - Per Surgery  - monitor JP output - ongoing wet-> dry abd dressings when supine - CT abd/ pelvis 11/10 stable without evidence of post op complication   Transaminitis, AST 6033 / ALT 2573    (previously 59 / 56 on 11/9) - ? 2/2 TPN vs shock liver  - trend LFTs - TPN d/c'd   Thrombocytopenia  - platelets 201-> 168-> 65 - no obvious signs of  further bleeding - DIC panel neg - pending HIT antibody (previously on lovenox) - LE dopplers pending as well   1.9 cm cystic appearing lesion located in the pancreatic body - found on CT 11/11 - will need further outpatient workup  Best practice:  Diet: NPO; advancing TF Pain/Anxiety/Delirium protocol (if indicated):Versed/ fent goal RASS -5 VAP protocol (if indicated): yes DVT prophylaxis: SCDs GI prophylaxis: Protonix BID Glucose control: Insulin gtt transitioned to SSI/ levemir  Mobility: Bedrest Code Status: Full Family Communication: pending Disposition: ICU  Labs   CBC: Recent Labs  Lab 09/04/20 0514 09/05/20 0355 09/06/20 0320 09/06/20 0320 09/07/20 0118 09/07/20 0118 09/07/20 1625 09/07/20 2159  09/07/20 2202 09/08/20 0148 09/08/20 0446 09/08/20 1007 09/08/20 1136 09/08/20 1802 09/09/20 0428 09/09/20 0755  WBC 12.4*   < > 11.0*   < > 12.5*  --  20.2*  --  34.5*  --  28.7*  --   --   --  23.7*  --   NEUTROABS 11.7*  --  9.6*  --   --   --   --   --   --   --   --   --   --   --   --   --   HGB 10.1*   < > 9.9*   < > 9.2*   < > 6.9*   < > 8.8*   < > 7.8* 7.8* 7.8* 7.1* 7.5*  --   HCT 32.7*   < > 33.1*   < > 30.4*   < > 22.9*   < > 28.3*   < > 24.1* 23.0* 23.6* 21.0* 22.3*  --   MCV 75.2*   < > 75.7*   < > 76.2*  --  76.8*  --  81.6  --  81.1  --   --   --  78.0*  --   PLT 291   < > 248   < > 233   < > 216  --  201  --  168  --   --   --  65* 68*   < > = values in this interval not displayed.    Basic Metabolic Panel: Recent Labs  Lab 09/05/20 0355 09/05/20 0355 09/06/20 0320 09/06/20 0320 09/07/20 0118 09/07/20 1625 09/08/20 0446 09/08/20 0446 09/08/20 1000 09/08/20 1000 09/08/20 1007 09/08/20 1800 09/08/20 1802 09/08/20 2218 09/09/20 0428  NA 153*   < > 150*   < > 146*   < > 144   < > 141   < > 141 138 140 136 136   136  K 4.0   < > 4.7   < > 4.3   < > 6.0*   < > 6.2*   < > 6.3* 6.4* 6.3* 5.4* 4.1   4.0  CL 119*   < > 118*   < > 115*   < > 113*  --  112*  --   --  109  --  106 105   106  CO2 27   < > 26   < > 25   < > 16*  --  19*  --   --  18*  --  19* 22   21*  GLUCOSE 261*   < > 255*   < > 263*   < > 139*  --  310*  --   --  375*  --  350* 233*   231*  BUN 37*   < > 32*   < >  26*   < > 39*  --  51*  --   --  60*  --  63* 52*   52*  CREATININE 1.02*   < > 0.86   < > 0.75   < > 1.71*  --  1.92*  --   --  2.34*  --  2.06* 1.65*   1.60*  CALCIUM 9.4   < > 9.4   < > 9.7   < > 9.2  --  8.4*  --   --  8.3*  --  8.0* 7.4*   7.4*  MG 1.8  --  2.0  --  1.6*  --  2.3  --   --   --   --   --   --   --  2.2  PHOS 2.0*  --  2.5  --  2.5  --  8.6*  --   --   --   --   --   --   --  4.6   4.4   < > = values in this interval not displayed.   GFR: Estimated Creatinine  Clearance: 38.2 mL/min (A) (by C-G formula based on SCr of 1.65 mg/dL (H)). Recent Labs  Lab 09/07/20 1625 09/07/20 2202 09/07/20 2232 09/08/20 0446 09/08/20 1212 09/08/20 2218 09/09/20 0428  PROCALCITON 0.27 0.61  --  7.57  --   --  12.02  WBC 20.2* 34.5*  --  28.7*  --   --  23.7*  LATICACIDVEN  --   --  9.9* 9.4* 4.1* 2.5*  --     Liver Function Tests: Recent Labs  Lab 09/04/20 0514 09/05/20 0355 09/06/20 0320 09/07/20 0118 09/09/20 0428  AST 20 35 60* 59* 6,033*  ALT 20 22 43 56* 2,573*  ALKPHOS 65 91 114 85 105  BILITOT 0.5 0.4 0.1* 0.6 1.2  PROT 5.4* 5.0* 5.1* 4.9* 3.9*  ALBUMIN 1.9* 1.8* 1.8* 1.7* 1.8*   1.8*   No results for input(s): LIPASE, AMYLASE in the last 168 hours. No results for input(s): AMMONIA in the last 168 hours.  ABG    Component Value Date/Time   PHART 7.244 (L) 09/08/2020 1802   PCO2ART 44.9 09/08/2020 1802   PO2ART 83 09/08/2020 1802   HCO3 19.4 (L) 09/08/2020 1802   TCO2 21 (L) 09/08/2020 1802   ACIDBASEDEF 7.0 (H) 09/08/2020 1802   O2SAT 94.0 09/08/2020 1802     CCT: 60 mins  Kennieth Rad, ACNP [facility] 09/09/2020, 10:23 AM  See Shea Evans for personal pager PCCM on call pager [phone]

## 2020-09-09 NOTE — Progress Notes (Signed)
eLink Physician-Brief Progress Note Patient Name: Candace [name] DOB: Jan 17, 1951 MRN: [id number]   Date of Service  09/09/2020  HPI/Events of Note  Patient is hypothermic, temperature is 94.8 degrees.  eICU Interventions  Bair hugger ordered.        Kerry Kass Paschal Blanton 09/09/2020, 12:49 AM

## 2020-09-09 NOTE — Progress Notes (Signed)
[facility] Progress Note  8 Days Post-Op  Subjective: CC-  Remains on the vent, sedated, prone. Off vasopressin and levo down to 25mcg/min. Started CRRT. Cr downtrending this AM and K normalized.  Hgb 7.5. DIC panel pending. CT abdomen/pelvis yesterday stable without evidence of postop complication. CT chest showed interval progression of diffuse bilateral hazy ground-glass airspace opacities with more focal areas of consolidation at the lung bases. Transaminases in the 1000s. TPN stopped. She is on TF 30cc/hr.  Objective: Vital signs in last 24 hours: Temp:  [94.5 F (34.7 C)-101.3 F (38.5 C)] 94.7 F (34.8 C) (11/11 0700) Pulse Rate:  [57-83] 67 (11/11 0900) Resp:  [23-26] 26 (11/11 0900) BP: (89-156)/(35-97) 89/49 (11/11 0900) SpO2:  [87 %-99 %] 95 % (11/11 0900) Arterial Line BP: (110-221)/(39-184) 117/52 (11/11 0900) FiO2 (%):  [80 %-100 %] 100 % (11/11 0800) Weight:  [91.9 kg] 91.9 kg (11/11 0245) Last BM Date:  (ostomy )  Intake/Output from previous day: 11/10 0701 - 11/11 0700 In: 6756.2 [I.V.:5547.8; Blood:315; NG/GT:240; IV Piggyback:653.4] Out: 1343 [Urine:220; Drains:145] Intake/Output this shift: Total I/O In: 473.5 [I.V.:423.5; NG/GT:50] Out: 363 [Urine:5; Drains:25; Other:333]  PE: Gen:  Sedated on the vent, prone HEENT: OG in place Card:  RRR, feet WWP bilaterally Pulm:  Mechanically ventilated Abd: unable to examine very well since she is prone but from lateral aspect appears soft, JP with minimal serosanguinous drainage (25cc/24hr) Ext:  calves soft   Lab Results:  Recent Labs    09/08/20 0446 09/08/20 1007 09/08/20 1802 09/09/20 0428  WBC 28.7*  --   --  23.7*  HGB 7.8*   < > 7.1* 7.5*  HCT 24.1*   < > 21.0* 22.3*  PLT 168  --   --  65*   < > = values in this interval not displayed.   BMET Recent Labs    09/08/20 2218 09/09/20 0428  NA 136 136  136  K 5.4* 4.1  4.0  CL 106 105  106  CO2 19* 22  21*  GLUCOSE 350*  233*  231*  BUN 63* 52*  52*  CREATININE 2.06* 1.65*  1.60*  CALCIUM 8.0* 7.4*  7.4*   PT/INR No results for input(s): LABPROT, INR in the last 72 hours. CMP     Component Value Date/Time   NA 136 09/09/2020 0428   NA 136 09/09/2020 0428   K 4.1 09/09/2020 0428   K 4.0 09/09/2020 0428   CL 105 09/09/2020 0428   CL 106 09/09/2020 0428   CO2 22 09/09/2020 0428   CO2 21 (L) 09/09/2020 0428   GLUCOSE 233 (H) 09/09/2020 0428   GLUCOSE 231 (H) 09/09/2020 0428   BUN 52 (H) 09/09/2020 0428   BUN 52 (H) 09/09/2020 0428   CREATININE 1.65 (H) 09/09/2020 0428   CREATININE 1.60 (H) 09/09/2020 0428   CALCIUM 7.4 (L) 09/09/2020 0428   CALCIUM 7.4 (L) 09/09/2020 0428   PROT 3.9 (L) 09/09/2020 0428   ALBUMIN 1.8 (L) 09/09/2020 0428   ALBUMIN 1.8 (L) 09/09/2020 0428   AST 6,033 (H) 09/09/2020 0428   ALT 2,573 (H) 09/09/2020 0428   ALKPHOS 105 09/09/2020 0428   BILITOT 1.2 09/09/2020 0428   GFRNONAA 33 (L) 09/09/2020 0428   GFRNONAA 35 (L) 09/09/2020 0428   Lipase     Component Value Date/Time   LIPASE 48 08/31/2020 2054       Studies/Results: CT ABDOMEN PELVIS WO CONTRAST  Result Date: 09/08/2020 CLINICAL DATA:  Sepsis.  COVID pneumonia.  Retroperitoneal hematoma. EXAM: CT CHEST, ABDOMEN AND PELVIS WITHOUT CONTRAST TECHNIQUE: Multidetector CT imaging of the chest, abdomen and pelvis was performed following the standard protocol without IV contrast. COMPARISON:  September 01, 2020 FINDINGS: CT CHEST FINDINGS Cardiovascular: The heart size is unremarkable. There are coronary artery calcifications. There are atherosclerotic changes of the thoracic aorta. The intracardiac blood pool is hypodense relative to the adjacent myocardium consistent with anemia. Central venous catheters are noted, all of which terminate near the cavoatrial junction. Mediastinum/Nodes: -- No mediastinal lymphadenopathy. There are few pockets of gas in the anterior mediastinum favored to represent gas within  venous structures. -- No hilar lymphadenopathy. -- No axillary lymphadenopathy. -- No supraclavicular lymphadenopathy. -- Normal thyroid gland where visualized. -  Unremarkable esophagus. Lungs/Pleura: There are diffuse bilateral hazy ground-glass airspace opacities with more focal areas of consolidation at the lung bases. These have progressed since the prior study. The endotracheal tube terminates above the carina. There is no pneumothorax. There are trace bilateral pleural effusions. Musculoskeletal: No chest wall abnormality. No bony spinal canal stenosis. CT ABDOMEN PELVIS FINDINGS Hepatobiliary: The liver is normal. The gallbladder is distended and filled with hyperdense material, which may represent vicarious excretion of contrast or gallbladder sludge.There is no biliary ductal dilation. Pancreas: There is a cystic appearing lesion measuring approximately 1.9 cm located in the pancreatic body (axial series 3, image 62). This was not present on the patient's CT from 2015. Spleen: Unremarkable. Adrenals/Urinary Tract: --Adrenal glands: There is stable thickening of the right adrenal gland. There is an unchanged left adrenal nodule. --Right kidney/ureter: Multiple peripelvic cysts are noted involving the right kidney. There is no frank hydronephrosis. --Left kidney/ureter: There is no left-sided hydronephrosis. --Urinary bladder: The bladder is decompressed by Foley catheter. Stomach/Bowel: --Stomach/Duodenum: There is an NG tube in place. --Small bowel: There is a right lower quadrant ileostomy. --Colon: The patient is status post prior colectomy. --Appendix: Surgically absent. Vascular/Lymphatic: Atherosclerotic calcification is present within the non-aneurysmal abdominal aorta, without hemodynamically significant stenosis. There is a partially visualized arterial line in the right inguinal region. --No retroperitoneal lymphadenopathy. --No mesenteric lymphadenopathy. --No pelvic or inguinal lymphadenopathy.  Reproductive: The endometrial stripe appears diffusely thickened. There is a small amount of free fluid the patient's pelvis. Other: A surgical drain is no new right upper quadrant. There is no definite abscess. No significant free air. There is an open midline incision. Musculoskeletal. No acute displaced fractures. IMPRESSION: 1. Interval progression of diffuse bilateral hazy ground-glass airspace opacities with more focal areas of consolidation at the lung bases 2. Trace bilateral pleural effusions. 3. Postsurgical changes the abdomen as detailed above. No evidence for a postoperative abscess, however evaluation is limited by lack of IV contrast. 4. There is a 1.9 cm cystic appearing lesion located in the pancreatic body. This was not present on the patient's CT from 2015. Follow-up with an outpatient contrast enhanced MRI is recommended. 5. The endometrial stripe appears diffusely thickened. Follow-up with pelvic ultrasound is recommended. Aortic Atherosclerosis (ICD10-I70.0). Electronically Signed   By: Constance Holster M.D.   On: 09/08/2020 19:53   DG Abd 1 View  Result Date: 09/07/2020 CLINICAL DATA:  Nausea EXAM: ABDOMEN - 1 VIEW COMPARISON:  September 06, 2020 FINDINGS: Drain again noted in lateral right abdomen. No bowel dilatation or air-fluid level to suggest bowel obstruction. No free air. Probable phleboliths in the pelvis. Nasogastric tube no longer present. IMPRESSION: No bowel obstruction or free air evident on supine examination. Drain in lateral right  abdomen again noted. Electronically Signed   By: Lowella Grip III M.D.   On: 09/07/2020 17:28   CT CHEST WO CONTRAST  Result Date: 09/08/2020 CLINICAL DATA:  Sepsis.  COVID pneumonia.  Retroperitoneal hematoma. EXAM: CT CHEST, ABDOMEN AND PELVIS WITHOUT CONTRAST TECHNIQUE: Multidetector CT imaging of the chest, abdomen and pelvis was performed following the standard protocol without IV contrast. COMPARISON:  September 01, 2020 FINDINGS:  CT CHEST FINDINGS Cardiovascular: The heart size is unremarkable. There are coronary artery calcifications. There are atherosclerotic changes of the thoracic aorta. The intracardiac blood pool is hypodense relative to the adjacent myocardium consistent with anemia. Central venous catheters are noted, all of which terminate near the cavoatrial junction. Mediastinum/Nodes: -- No mediastinal lymphadenopathy. There are few pockets of gas in the anterior mediastinum favored to represent gas within venous structures. -- No hilar lymphadenopathy. -- No axillary lymphadenopathy. -- No supraclavicular lymphadenopathy. -- Normal thyroid gland where visualized. -  Unremarkable esophagus. Lungs/Pleura: There are diffuse bilateral hazy ground-glass airspace opacities with more focal areas of consolidation at the lung bases. These have progressed since the prior study. The endotracheal tube terminates above the carina. There is no pneumothorax. There are trace bilateral pleural effusions. Musculoskeletal: No chest wall abnormality. No bony spinal canal stenosis. CT ABDOMEN PELVIS FINDINGS Hepatobiliary: The liver is normal. The gallbladder is distended and filled with hyperdense material, which may represent vicarious excretion of contrast or gallbladder sludge.There is no biliary ductal dilation. Pancreas: There is a cystic appearing lesion measuring approximately 1.9 cm located in the pancreatic body (axial series 3, image 62). This was not present on the patient's CT from 2015. Spleen: Unremarkable. Adrenals/Urinary Tract: --Adrenal glands: There is stable thickening of the right adrenal gland. There is an unchanged left adrenal nodule. --Right kidney/ureter: Multiple peripelvic cysts are noted involving the right kidney. There is no frank hydronephrosis. --Left kidney/ureter: There is no left-sided hydronephrosis. --Urinary bladder: The bladder is decompressed by Foley catheter. Stomach/Bowel: --Stomach/Duodenum: There is an  NG tube in place. --Small bowel: There is a right lower quadrant ileostomy. --Colon: The patient is status post prior colectomy. --Appendix: Surgically absent. Vascular/Lymphatic: Atherosclerotic calcification is present within the non-aneurysmal abdominal aorta, without hemodynamically significant stenosis. There is a partially visualized arterial line in the right inguinal region. --No retroperitoneal lymphadenopathy. --No mesenteric lymphadenopathy. --No pelvic or inguinal lymphadenopathy. Reproductive: The endometrial stripe appears diffusely thickened. There is a small amount of free fluid the patient's pelvis. Other: A surgical drain is no new right upper quadrant. There is no definite abscess. No significant free air. There is an open midline incision. Musculoskeletal. No acute displaced fractures. IMPRESSION: 1. Interval progression of diffuse bilateral hazy ground-glass airspace opacities with more focal areas of consolidation at the lung bases 2. Trace bilateral pleural effusions. 3. Postsurgical changes the abdomen as detailed above. No evidence for a postoperative abscess, however evaluation is limited by lack of IV contrast. 4. There is a 1.9 cm cystic appearing lesion located in the pancreatic body. This was not present on the patient's CT from 2015. Follow-up with an outpatient contrast enhanced MRI is recommended. 5. The endometrial stripe appears diffusely thickened. Follow-up with pelvic ultrasound is recommended. Aortic Atherosclerosis (ICD10-I70.0). Electronically Signed   By: Constance Holster M.D.   On: 09/08/2020 19:53   DG Chest Port 1 View  Result Date: 09/08/2020 CLINICAL DATA:  69 year old female with central line placement. EXAM: PORTABLE CHEST 1 VIEW COMPARISON:  Earlier chest radiograph dated 09/08/2020. FINDINGS: Interval placement of a  right IJ central venous line with tip over central SVC close to the cavoatrial junction. No pneumothorax. Right-sided PICC, endotracheal tube,  and enteric tube as well as left subclavian central venous line appear in similar position. Bilateral pulmonary opacities predominantly involving the mid to lower lung field again noted. No large pleural effusion. Stable cardiac silhouette. No acute osseous pathology. Degenerative changes of the left shoulder. IMPRESSION: Interval placement of a right IJ central venous line with tip over central SVC close to the cavoatrial junction. No pneumothorax. Electronically Signed   By: Anner Crete M.D.   On: 09/08/2020 16:49   DG Chest Port 1 View  Result Date: 09/08/2020 CLINICAL DATA:  ARDS. EXAM: PORTABLE CHEST 1 VIEW COMPARISON:  September 07, 2020. FINDINGS: Stable cardiomediastinal silhouette. Endotracheal and nasogastric tubes are in good position. Left subclavian catheter is unchanged. Right-sided PICC line is unchanged. Stable bilateral lung opacities are noted consistent multifocal pneumonia. No pneumothorax is noted. Small bilateral pleural effusions are noted. Bony thorax is unremarkable. IMPRESSION: Stable bilateral lung opacities are noted consistent with multifocal pneumonia. Small bilateral pleural effusions are noted. Electronically Signed   By: Marijo Conception M.D.   On: 09/08/2020 08:01   DG CHEST PORT 1 VIEW  Result Date: 09/07/2020 CLINICAL DATA:  COVID-19 positive, pneumonia, intubated EXAM: PORTABLE CHEST 1 VIEW COMPARISON:  09/07/2020 at 5:08 p.m. FINDINGS: Single frontal view of the chest demonstrates endotracheal tube overlying tracheal air column, tip midway between thoracic inlet and carina. Enteric catheter passes below diaphragm tip excluded by collimation. Right-sided PICC and left subclavian central venous catheter projects over the superior vena cava. Cardiac silhouette is stable. There is progressive multifocal basilar predominant airspace disease consistent with pneumonia. No effusion or pneumothorax. IMPRESSION: 1. Progressive bibasilar airspace disease consistent with  multifocal pneumonia. 2. Support devices as above. Electronically Signed   By: Randa Ngo M.D.   On: 09/07/2020 22:37   DG Chest Port 1 View  Result Date: 09/07/2020 CLINICAL DATA:  Nausea with cough and shortness of breath. Reported recent COVID-19 positive EXAM: PORTABLE CHEST 1 VIEW COMPARISON:  September 06, 2020. FINDINGS: Central catheter tip is in the superior vena cava. Nasogastric tube no longer present. Airspace opacity is noted in the mid and lower lung regions, similar to 1 day prior. Heart is upper normal in size with pulmonary vascularity normal. No adenopathy. There is aortic atherosclerosis. Arthropathy in the left shoulder again noted. IMPRESSION: Multifocal airspace opacity consistent with multifocal pneumonia. Stable cardiac silhouette. No adenopathy evident. Aortic Atherosclerosis (ICD10-I70.0). Electronically Signed   By: Lowella Grip III M.D.   On: 09/07/2020 17:27   DG Abd Portable 1V  Result Date: 09/07/2020 CLINICAL DATA:  Enteric catheter placement EXAM: PORTABLE ABDOMEN - 1 VIEW COMPARISON:  09/07/2020 at 5:11 p.m. FINDINGS: Frontal view of the abdomen demonstrates surgical drain overlying the right mid abdomen. Enteric catheter coiled back upon itself, tip in the region of the gastric antrum. Bowel gas pattern is unremarkable. IMPRESSION: 1. Enteric catheter overlying gastric antrum. Electronically Signed   By: Randa Ngo M.D.   On: 09/07/2020 22:40    Anti-infectives: Anti-infectives (From admission, onward)   Start     Dose/Rate Route Frequency Ordered Stop   09/08/20 1000  vancomycin (VANCOREADY) IVPB 2000 mg/400 mL        2,000 mg 200 mL/hr over 120 Minutes Intravenous  Once 09/08/20 0857 09/08/20 1224   09/08/20 1000  ceFEPIme (MAXIPIME) 2 g in sodium chloride 0.9 % 100 mL IVPB  2 g 200 mL/hr over 30 Minutes Intravenous Every 12 hours 09/08/20 0857     09/08/20 0856  vancomycin variable dose per unstable renal function (pharmacist dosing)          Does not apply See admin instructions 09/08/20 0857     09/02/20 1600  cefTRIAXone (ROCEPHIN) 1 g in sodium chloride 0.9 % 100 mL IVPB  Status:  Discontinued        1 g 200 mL/hr over 30 Minutes Intravenous Every 24 hours 09/01/20 1811 09/02/20 0838   09/01/20 1800  fluconazole (DIFLUCAN) IVPB 400 mg        400 mg 50 mL/hr over 240 Minutes Intravenous  Once 09/01/20 1749 09/02/20 0603   09/01/20 1530  piperacillin-tazobactam (ZOSYN) IVPB 3.375 g        3.375 g 12.5 mL/hr over 240 Minutes Intravenous Every 8 hours 09/01/20 1514 09/05/20 2111   09/01/20 1000  levofloxacin (LEVAQUIN) tablet 250 mg  Status:  Discontinued        250 mg Oral Daily 08/31/20 1508 08/31/20 1735   08/31/20 1730  cefTRIAXone (ROCEPHIN) 1 g in sodium chloride 0.9 % 100 mL IVPB  Status:  Discontinued        1 g 200 mL/hr over 30 Minutes Intravenous Every 24 hours 08/31/20 1726 09/01/20 1513   08/31/20 1730  azithromycin (ZITHROMAX) 500 mg in sodium chloride 0.9 % 250 mL IVPB  Status:  Discontinued        500 mg 250 mL/hr over 60 Minutes Intravenous Every 24 hours 08/31/20 1726 09/02/20 0838       Assessment/Plan HTN HLD DM Hx colon cancer s/p partial colectomy/colostomy followed by completion colectomy and ileostomy Hypoxia requiring 3L O2 Covid-19 pneumonia  Elevated D dimer on eliquis -eliquis reversed with kcentra, hold eliquis A fib RVR Anemia 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 Bobbye Morton -POD#8 - continue PPI BID - UGI negative for leak 11/8 - continue JP drain and monitor output - started on tube feedings - BID wet to dry  dressing changes to midline abdominal wound - RN will call me today when patient is supined - CT abdomen/pelvis 11/10 stable without evidence of postop complication  ID -zosyn 47/8>>29/5. Maxipime/vancomycin 11/10>> VTE -on hold due to bleeding FEN -OG, TPN stopped due to elevated LFTs, TF @ 30cc/hr Foley -replaced 11/9 Follow up -Dr. Chrystie Nose - daughter [name] [phone]   LOS: 8 days    Wellington Hampshire, [facility] Surgery 09/09/2020, 9:15 AM Please see Amion for pager number during day hours 7:00am-4:30pm

## 2020-09-10 DIAGNOSIS — I4891 Unspecified atrial fibrillation: Secondary | ICD-10-CM | POA: Diagnosis not present

## 2020-09-10 DIAGNOSIS — J1282 Pneumonia due to coronavirus disease 2019: Secondary | ICD-10-CM | POA: Diagnosis not present

## 2020-09-10 DIAGNOSIS — R579 Shock, unspecified: Secondary | ICD-10-CM

## 2020-09-10 DIAGNOSIS — U071 COVID-19: Secondary | ICD-10-CM | POA: Diagnosis not present

## 2020-09-10 LAB — RENAL FUNCTION PANEL
Albumin: 2 g/dL — ABNORMAL LOW (ref 3.5–5.0)
Anion gap: 7 (ref 5–15)
BUN: 35 mg/dL — ABNORMAL HIGH (ref 8–23)
CO2: 25 mmol/L (ref 22–32)
Calcium: 8.1 mg/dL — ABNORMAL LOW (ref 8.9–10.3)
Chloride: 104 mmol/L (ref 98–111)
Creatinine, Ser: 1.19 mg/dL — ABNORMAL HIGH (ref 0.44–1.00)
GFR, Estimated: 49 mL/min — ABNORMAL LOW (ref >60)
Glucose, Bld: 184 mg/dL — ABNORMAL HIGH (ref 70–99)
Phosphorus: 4.1 mg/dL (ref 2.5–4.6)
Potassium: 4.4 mmol/L (ref 3.5–5.1)
Sodium: 136 mmol/L (ref 135–145)

## 2020-09-10 LAB — BLOOD GAS, ARTERIAL
Acid-base deficit: 3.6 mmol/L — ABNORMAL HIGH (ref 0.0–2.0)
Bicarbonate: 22.9 mmol/L (ref 20.0–28.0)
FIO2: 80
O2 Saturation: 94.8 %
Patient temperature: 36.5
pCO2 arterial: 56.1 mmHg — ABNORMAL HIGH (ref 32.0–48.0)
pH, Arterial: 7.232 — ABNORMAL LOW (ref 7.350–7.450)
pO2, Arterial: 81.8 mmHg — ABNORMAL LOW (ref 83.0–108.0)

## 2020-09-10 LAB — COMPREHENSIVE METABOLIC PANEL
ALT: 2693 U/L — ABNORMAL HIGH (ref 0–44)
AST: 1481 U/L — ABNORMAL HIGH (ref 15–41)
Albumin: 1.9 g/dL — ABNORMAL LOW (ref 3.5–5.0)
Alkaline Phosphatase: 173 U/L — ABNORMAL HIGH (ref 38–126)
Anion gap: 7 (ref 5–15)
BUN: 30 mg/dL — ABNORMAL HIGH (ref 8–23)
CO2: 24 mmol/L (ref 22–32)
Calcium: 8.2 mg/dL — ABNORMAL LOW (ref 8.9–10.3)
Chloride: 102 mmol/L (ref 98–111)
Creatinine, Ser: 1.2 mg/dL — ABNORMAL HIGH (ref 0.44–1.00)
GFR, Estimated: 49 mL/min — ABNORMAL LOW (ref >60)
Glucose, Bld: 211 mg/dL — ABNORMAL HIGH (ref 70–99)
Potassium: 4.4 mmol/L (ref 3.5–5.1)
Sodium: 133 mmol/L — ABNORMAL LOW (ref 135–145)
Total Bilirubin: 1.2 mg/dL (ref 0.3–1.2)
Total Protein: 4.8 g/dL — ABNORMAL LOW (ref 6.5–8.1)

## 2020-09-10 LAB — GLUCOSE, CAPILLARY
Glucose-Capillary: 103 mg/dL — ABNORMAL HIGH (ref 70–99)
Glucose-Capillary: 106 mg/dL — ABNORMAL HIGH (ref 70–99)
Glucose-Capillary: 116 mg/dL — ABNORMAL HIGH (ref 70–99)
Glucose-Capillary: 176 mg/dL — ABNORMAL HIGH (ref 70–99)
Glucose-Capillary: 200 mg/dL — ABNORMAL HIGH (ref 70–99)
Glucose-Capillary: 218 mg/dL — ABNORMAL HIGH (ref 70–99)
Glucose-Capillary: 271 mg/dL — ABNORMAL HIGH (ref 70–99)

## 2020-09-10 LAB — HEPARIN INDUCED PLATELET AB (HIT ANTIBODY): Heparin Induced Plt Ab: 0.164 OD (ref 0.000–0.400)

## 2020-09-10 LAB — TRIGLYCERIDES: Triglycerides: 134 mg/dL (ref <150)

## 2020-09-10 LAB — PROCALCITONIN: Procalcitonin: 10.16 ng/mL

## 2020-09-10 LAB — MAGNESIUM: Magnesium: 2.4 mg/dL (ref 1.7–2.4)

## 2020-09-10 MED ORDER — HYDROCORTISONE NA SUCCINATE PF 100 MG IJ SOLR
50.0000 mg | Freq: Every day | INTRAMUSCULAR | Status: AC
  Administered 2020-09-11 – 2020-09-13 (×3): 50 mg via INTRAVENOUS
  Filled 2020-09-10 (×3): qty 2

## 2020-09-10 MED ORDER — PIVOT 1.5 CAL PO LIQD
1000.0000 mL | ORAL | Status: DC
  Administered 2020-09-10 – 2020-09-22 (×15): 1000 mL
  Filled 2020-09-10 (×17): qty 1000

## 2020-09-10 MED ORDER — VECURONIUM BROMIDE 10 MG IV SOLR
5.0000 mg | INTRAVENOUS | Status: DC | PRN
  Administered 2020-09-13 (×2): 5 mg via INTRAVENOUS
  Filled 2020-09-10 (×3): qty 10

## 2020-09-10 MED ORDER — BISACODYL 10 MG RE SUPP: 10.0000 mg | Freq: Once | RECTAL | Status: DC

## 2020-09-10 MED ORDER — B COMPLEX-C PO TABS
1.0000 | ORAL_TABLET | Freq: Every day | ORAL | Status: DC
  Administered 2020-09-10 – 2020-11-18 (×70): 1
  Filled 2020-09-10 (×70): qty 1

## 2020-09-10 NOTE — Progress Notes (Signed)
eLink Physician-Brief Progress Note Patient Name: Sharnetta Gielow DOB: [birth year] MRN: [id number]   Date of Service  09/10/2020  HPI/Events of Note  P?F ratio from 5:45 PM tonight = 81.8/0.8 = 102.25. Since the P/F ratio is < 150 the patient will require pronation again tonight.   eICU Interventions  Prone patient tonight per protocol.     Intervention Category Major Interventions: Respiratory failure - evaluation and management;Hypoxemia - evaluation and management  Lysle Dingwall 09/10/2020, 9:33 PM

## 2020-09-10 NOTE — Progress Notes (Signed)
Nutrition Follow-up  DOCUMENTATION CODES:   Obesity unspecified  INTERVENTION:   Tube Feeding via OG or Cortrak: Pivot 1.5 at 65 ml/hr Increase TF to 40 ml/hr; titrate by 10 mL q 8 hours until goal rate of 65 ml/hr Provides 146 g of protein, 2340 kcals and 1186 mL of free water  Add B-complex with C  NUTRITION DIAGNOSIS:   Inadequate oral intake related to inability to eat as evidenced by NPO status.  Being addressed via TF   GOAL:   Patient will meet greater than or equal to 90% of their needs  Progressing  MONITOR:   Diet advancement, Skin, Weight trends, Labs, I & O's, Other (Comment) (TPN tolerance)  REASON FOR ASSESSMENT:   Consult New TPN/TNA  ASSESSMENT:   69 y.o. female was recently hospitalized for COVID-19 pneumonia-and discharged on 2-3 L of home O2-presents with worsening shortness of breath-found to have A. fib with RVR, worsening hypoxia. Pt developed severe upper abdominal pain-subsequent further imaging studies revealed perforated viscus. S/p laparotomy on 11/3 which showed a perforated duodenal ulcer.  11/03 Exploratory laparotomy, adhesiolysis, modified graham patch repair of perforated duodenal ulcer 11/05 TPN initiated 11/08 UGI negative for leak 11/09 Acute decline in respiratory and mental status requiring intubation 11/10 Started on trickle TF, CT abdomen negative for postop complication 93/26 TPN discontinued due to elevated LFTS, TGs; TF increased to 30 ml/hr  Pt remains on vent support, currently in prone position, plan to flip to supine position around 1600 today and place Cortrak tube  Tolerating Pivot 1.5 at 30 ml/hr via OG  TPN d/c yesterday due to significantly elevated LFTs, LFTs improved today  Skin breakdown on both cheeks per RRT noted overnight  Current wt 94 kg; weight 92 kg yesterday  Labs: CBGs 116-258 (ICU goal 140-180) Meds: ss novolog, novolog q 4 hours, levemir   Diet Order:   Diet Order            Diet NPO  time specified  Diet effective now                 EDUCATION NEEDS:   Not appropriate for education at this time  Skin:  Skin Assessment: Skin Integrity Issues: Skin Integrity Issues:: Stage I Stage I: sacrum Incisions: abdomen  Last BM:  11/12 via iloeostomy (green, small amounts)  Height:   Ht Readings from Last 1 Encounters:  08/31/20 5\' 8"  (1.727 m)    Weight:   Wt Readings from Last 1 Encounters:  09/10/20 94.3 kg    BMI:  Body mass index is 31.61 kg/m.  Estimated Nutritional Needs:   Kcal:  2200-2450  Protein:  125-150 g  Fluid:  >/= 2 L/day    Kerman Passey MS, RDN, LDN, CNSC Registered Dietitian III Clinical Nutrition RD Pager and On-Call Pager Number Located in [place]

## 2020-09-10 NOTE — Progress Notes (Signed)
PT Cancellation Note  Patient Details Name: Candace [name] MRN: [id number] DOB: 1951/02/23   Cancelled Treatment:    Reason Eval/Treat Not Completed: Patient not medically ready (pt intubated, proned and not medically ready. Will sign off and please reorder as appropriate)   Ramez Arrona B Burke Terry 09/10/2020, 7:57 AM  [name], PT Acute Rehabilitation Services Pager: [phone] Office: [phone]

## 2020-09-10 NOTE — Progress Notes (Signed)
Patient is currently surgically stable.  Her wound is clean.  Continue dressing changes as able in prone position.  Continue TFs.  We will resee patient on Monday.  Please call us sooner if questions or concerns arise.  Henreitta Cea 11:58 AM 09/10/2020

## 2020-09-10 NOTE — Progress Notes (Signed)
RT NOTE:  ETT taped and secured @ 24cm with cloth tape. Foam padding placed on cheeks and upper lip. Breakdown on both cheeks. Pt rotated to prone position by RT and RN x4. Pt tolerated well.

## 2020-09-10 NOTE — Procedures (Signed)
Cortrak  Person Inserting Tube:  Maylon Peppers C, RD Tube Type:  Cortrak - 43 inches Tube Location:  Left nare Initial Placement:  Stomach Secured by: Bridle Technique Used to Measure Tube Placement:  Documented cm marking at nare/ corner of mouth Cortrak Secured At:  69 cm    Cortrak Tube Team Note:  Consult received to place a Cortrak feeding tube.   No x-ray is required. RN may begin using tube.   If the tube becomes dislodged please keep the tube and contact the Cortrak team at [email or web address] (password [email or web address]) for replacement.  If after hours and replacement cannot be delayed, place a NG tube and confirm placement with an abdominal x-ray.    Lockie Pares., RD, LDN, CNSC See AMiON for contact information

## 2020-09-10 NOTE — Progress Notes (Signed)
[facility] NEPHROLOGY PROGRESS NOTE  Assessment/ Plan: Pt is a 69 y.o. yo female with history of DM, HTN, HLD, colon cancer status post partial colectomy, colostomy who was admitted on 11/2 with A. fib with RVR, Covid pneumonia, found to have perforated duodenal ulcer and underwent exploratory laparotomy surgery, developed hemorrhagic shock, respiratory failure seen as a consultation for acute kidney injury and hyperkalemia.   #Acute kidney injury, oliguric likely ATN due to hemorrhagic shock: Patient initially had AKI with creatinine level of 5.6 on admission due to A. fib with RVR/hemodynamic instability which was improved.  However she developed another episode of AKI after shock.  Urinalysis was contaminated/UTI. CT scan A/P without hydronephrosis. Started CRRT on 11/10 because of oliguria and hyperkalemia. Tolerating well.  Currently on prefilter and post filter 2K bath and dialysate 4K bath.  #Hyperkalemia due to AKI and intra-abdominal bleeding/PRBC transfusion: Now managed with CRRT . #Hemorrhagic shock: Received blood transfusion.  Currently on Levophed. Maintain MAP >65.   #Perforated duodenal ulcer status post ex lap: General surgery is following.  Currently on TPN.  #ARDS/recent Covid pneumonia: Currently on mechanical ventilation, prone position.  Per primary team.  Subjective: Seen and examined at ICU in Covid isolation.  She is sedated, on mechanical ventilation and on prone position.  Remains anuric.  Tolerating CRRT well. Objective Vital signs in last 24 hours: Vitals:   09/10/20 0600 09/10/20 0700 09/10/20 0751 09/10/20 0800  BP: 100/60 (!) 93/59  (!) 96/58  Pulse: (!) 52 (!) 53  (!) 54  Resp:    (!) 30  Temp:    (!) 96.6 F (35.9 C)  TempSrc:    Axillary  SpO2: 95% 95% 98% 98%  Weight:      Height:       Weight change: 2.4 kg  Intake/Output Summary (Last 24 hours) at 09/10/2020 0846 Last data filed at 09/10/2020 0800 Gross per 24 hour   Intake 2241.21 ml  Output 4294 ml  Net -2052.79 ml       Labs: Basic Metabolic Panel: Recent Labs  Lab 09/08/20 0446 09/08/20 1000 09/08/20 2218 09/09/20 0428 09/09/20 1600  NA 144   < > 136 136  136 136  K 6.0*   < > 5.4* 4.1  4.0 4.7  CL 113*   < > 106 105  106 104  CO2 16*   < > 19* 22  21* 24  GLUCOSE 139*   < > 350* 233*  231* 224*  BUN 39*   < > 63* 52*  52* 41*  CREATININE 1.71*   < > 2.06* 1.65*  1.60* 1.44*  CALCIUM 9.2   < > 8.0* 7.4*  7.4* 7.6*  PHOS 8.6*  --   --  4.6  4.4 4.2   < > = values in this interval not displayed.   Liver Function Tests: Recent Labs  Lab 09/06/20 0320 09/06/20 0320 09/07/20 0118 09/09/20 0428 09/09/20 1600  AST 60*  --  59* 6,033*  --   ALT 43  --  56* 2,573*  --   ALKPHOS 114  --  85 105  --   BILITOT 0.1*  --  0.6 1.2  --   PROT 5.1*  --  4.9* 3.9*  --   ALBUMIN 1.8*   < > 1.7* 1.8*  1.8* 2.0*   < > = values in this interval not displayed.   No results for input(s): LIPASE, AMYLASE in the last 168 hours. No results for input(s):  AMMONIA in the last 168 hours. CBC: Recent Labs  Lab 09/04/20 0514 09/05/20 0355 09/06/20 0320 09/06/20 0320 09/07/20 0118 09/07/20 0118 09/07/20 1625 09/07/20 2159 09/07/20 2202 09/08/20 0148 09/08/20 0446 09/08/20 1007 09/08/20 1136 09/08/20 1802 09/09/20 0428 09/09/20 0755  WBC 12.4*   < > 11.0*   < > 12.5*   < > 20.2*  --  34.5*  --  28.7*  --   --   --  23.7*  --   NEUTROABS 11.7*  --  9.6*  --   --   --   --   --   --   --   --   --   --   --   --   --   HGB 10.1*   < > 9.9*   < > 9.2*   < > 6.9*   < > 8.8*   < > 7.8*   < > 7.8* 7.1* 7.5*  --   HCT 32.7*   < > 33.1*   < > 30.4*   < > 22.9*   < > 28.3*   < > 24.1*   < > 23.6* 21.0* 22.3*  --   MCV 75.2*   < > 75.7*   < > 76.2*  --  76.8*  --  81.6  --  81.1  --   --   --  78.0*  --   PLT 291   < > 248   < > 233   < > 216  --  201   < > 168  --   --   --  65* 68*   < > = values in this interval not displayed.    Cardiac Enzymes: No results for input(s): CKTOTAL, CKMB, CKMBINDEX, TROPONINI in the last 168 hours. CBG: Recent Labs  Lab 09/09/20 1515 09/09/20 1933 09/09/20 2313 09/10/20 0324 09/10/20 0740  GLUCAP 211* 258* 218* 176* 116*    Iron Studies: No results for input(s): IRON, TIBC, TRANSFERRIN, FERRITIN in the last 72 hours. Studies/Results: CT ABDOMEN PELVIS WO CONTRAST  Result Date: 09/08/2020 CLINICAL DATA:  Sepsis.  COVID pneumonia.  Retroperitoneal hematoma. EXAM: CT CHEST, ABDOMEN AND PELVIS WITHOUT CONTRAST TECHNIQUE: Multidetector CT imaging of the chest, abdomen and pelvis was performed following the standard protocol without IV contrast. COMPARISON:  September 01, 2020 FINDINGS: CT CHEST FINDINGS Cardiovascular: The heart size is unremarkable. There are coronary artery calcifications. There are atherosclerotic changes of the thoracic aorta. The intracardiac blood pool is hypodense relative to the adjacent myocardium consistent with anemia. Central venous catheters are noted, all of which terminate near the cavoatrial junction. Mediastinum/Nodes: -- No mediastinal lymphadenopathy. There are few pockets of gas in the anterior mediastinum favored to represent gas within venous structures. -- No hilar lymphadenopathy. -- No axillary lymphadenopathy. -- No supraclavicular lymphadenopathy. -- Normal thyroid gland where visualized. -  Unremarkable esophagus. Lungs/Pleura: There are diffuse bilateral hazy ground-glass airspace opacities with more focal areas of consolidation at the lung bases. These have progressed since the prior study. The endotracheal tube terminates above the carina. There is no pneumothorax. There are trace bilateral pleural effusions. Musculoskeletal: No chest wall abnormality. No bony spinal canal stenosis. CT ABDOMEN PELVIS FINDINGS Hepatobiliary: The liver is normal. The gallbladder is distended and filled with hyperdense material, which may represent vicarious excretion  of contrast or gallbladder sludge.There is no biliary ductal dilation. Pancreas: There is a cystic appearing lesion measuring approximately 1.9 cm located in the pancreatic body (axial series  3, image 62). This was not present on the patient's CT from 2015. Spleen: Unremarkable. Adrenals/Urinary Tract: --Adrenal glands: There is stable thickening of the right adrenal gland. There is an unchanged left adrenal nodule. --Right kidney/ureter: Multiple peripelvic cysts are noted involving the right kidney. There is no frank hydronephrosis. --Left kidney/ureter: There is no left-sided hydronephrosis. --Urinary bladder: The bladder is decompressed by Foley catheter. Stomach/Bowel: --Stomach/Duodenum: There is an NG tube in place. --Small bowel: There is a right lower quadrant ileostomy. --Colon: The patient is status post prior colectomy. --Appendix: Surgically absent. Vascular/Lymphatic: Atherosclerotic calcification is present within the non-aneurysmal abdominal aorta, without hemodynamically significant stenosis. There is a partially visualized arterial line in the right inguinal region. --No retroperitoneal lymphadenopathy. --No mesenteric lymphadenopathy. --No pelvic or inguinal lymphadenopathy. Reproductive: The endometrial stripe appears diffusely thickened. There is a small amount of free fluid the patient's pelvis. Other: A surgical drain is no new right upper quadrant. There is no definite abscess. No significant free air. There is an open midline incision. Musculoskeletal. No acute displaced fractures. IMPRESSION: 1. Interval progression of diffuse bilateral hazy ground-glass airspace opacities with more focal areas of consolidation at the lung bases 2. Trace bilateral pleural effusions. 3. Postsurgical changes the abdomen as detailed above. No evidence for a postoperative abscess, however evaluation is limited by lack of IV contrast. 4. There is a 1.9 cm cystic appearing lesion located in the pancreatic body.  This was not present on the patient's CT from 2015. Follow-up with an outpatient contrast enhanced MRI is recommended. 5. The endometrial stripe appears diffusely thickened. Follow-up with pelvic ultrasound is recommended. Aortic Atherosclerosis (ICD10-I70.0). Electronically Signed   By: Constance Holster M.D.   On: 09/08/2020 19:53   CT CHEST WO CONTRAST  Result Date: 09/08/2020 CLINICAL DATA:  Sepsis.  COVID pneumonia.  Retroperitoneal hematoma. EXAM: CT CHEST, ABDOMEN AND PELVIS WITHOUT CONTRAST TECHNIQUE: Multidetector CT imaging of the chest, abdomen and pelvis was performed following the standard protocol without IV contrast. COMPARISON:  September 01, 2020 FINDINGS: CT CHEST FINDINGS Cardiovascular: The heart size is unremarkable. There are coronary artery calcifications. There are atherosclerotic changes of the thoracic aorta. The intracardiac blood pool is hypodense relative to the adjacent myocardium consistent with anemia. Central venous catheters are noted, all of which terminate near the cavoatrial junction. Mediastinum/Nodes: -- No mediastinal lymphadenopathy. There are few pockets of gas in the anterior mediastinum favored to represent gas within venous structures. -- No hilar lymphadenopathy. -- No axillary lymphadenopathy. -- No supraclavicular lymphadenopathy. -- Normal thyroid gland where visualized. -  Unremarkable esophagus. Lungs/Pleura: There are diffuse bilateral hazy ground-glass airspace opacities with more focal areas of consolidation at the lung bases. These have progressed since the prior study. The endotracheal tube terminates above the carina. There is no pneumothorax. There are trace bilateral pleural effusions. Musculoskeletal: No chest wall abnormality. No bony spinal canal stenosis. CT ABDOMEN PELVIS FINDINGS Hepatobiliary: The liver is normal. The gallbladder is distended and filled with hyperdense material, which may represent vicarious excretion of contrast or gallbladder  sludge.There is no biliary ductal dilation. Pancreas: There is a cystic appearing lesion measuring approximately 1.9 cm located in the pancreatic body (axial series 3, image 62). This was not present on the patient's CT from 2015. Spleen: Unremarkable. Adrenals/Urinary Tract: --Adrenal glands: There is stable thickening of the right adrenal gland. There is an unchanged left adrenal nodule. --Right kidney/ureter: Multiple peripelvic cysts are noted involving the right kidney. There is no frank hydronephrosis. --Left kidney/ureter: There is  no left-sided hydronephrosis. --Urinary bladder: The bladder is decompressed by Foley catheter. Stomach/Bowel: --Stomach/Duodenum: There is an NG tube in place. --Small bowel: There is a right lower quadrant ileostomy. --Colon: The patient is status post prior colectomy. --Appendix: Surgically absent. Vascular/Lymphatic: Atherosclerotic calcification is present within the non-aneurysmal abdominal aorta, without hemodynamically significant stenosis. There is a partially visualized arterial line in the right inguinal region. --No retroperitoneal lymphadenopathy. --No mesenteric lymphadenopathy. --No pelvic or inguinal lymphadenopathy. Reproductive: The endometrial stripe appears diffusely thickened. There is a small amount of free fluid the patient's pelvis. Other: A surgical drain is no new right upper quadrant. There is no definite abscess. No significant free air. There is an open midline incision. Musculoskeletal. No acute displaced fractures. IMPRESSION: 1. Interval progression of diffuse bilateral hazy ground-glass airspace opacities with more focal areas of consolidation at the lung bases 2. Trace bilateral pleural effusions. 3. Postsurgical changes the abdomen as detailed above. No evidence for a postoperative abscess, however evaluation is limited by lack of IV contrast. 4. There is a 1.9 cm cystic appearing lesion located in the pancreatic body. This was not present on the  patient's CT from 2015. Follow-up with an outpatient contrast enhanced MRI is recommended. 5. The endometrial stripe appears diffusely thickened. Follow-up with pelvic ultrasound is recommended. Aortic Atherosclerosis (ICD10-I70.0). Electronically Signed   By: Constance Holster M.D.   On: 09/08/2020 19:53   DG Chest Port 1 View  Result Date: 09/09/2020 CLINICAL DATA:  Acute respiratory failure EXAM: PORTABLE CHEST 1 VIEW COMPARISON:  September 08, 2020 FINDINGS: There are persistent bilateral airspace opacities, similar to prior study. There is no pneumothorax. The lines and tubes are essentially stable from prior study. The heart size is stable. There is no acute osseous abnormality. IMPRESSION: Stable lines and tubes.  Unchanged bilateral airspace opacities. Electronically Signed   By: Constance Holster M.D.   On: 09/09/2020 19:23   DG Chest Port 1 View  Result Date: 09/08/2020 CLINICAL DATA:  69 year old female with central line placement. EXAM: PORTABLE CHEST 1 VIEW COMPARISON:  Earlier chest radiograph dated 09/08/2020. FINDINGS: Interval placement of a right IJ central venous line with tip over central SVC close to the cavoatrial junction. No pneumothorax. Right-sided PICC, endotracheal tube, and enteric tube as well as left subclavian central venous line appear in similar position. Bilateral pulmonary opacities predominantly involving the mid to lower lung field again noted. No large pleural effusion. Stable cardiac silhouette. No acute osseous pathology. Degenerative changes of the left shoulder. IMPRESSION: Interval placement of a right IJ central venous line with tip over central SVC close to the cavoatrial junction. No pneumothorax. Electronically Signed   By: Anner Crete M.D.   On: 09/08/2020 16:49    Medications: Infusions: . ceFEPime (MAXIPIME) IV Stopped (09/09/20 2151)  . cisatracurium (NIMBEX) infusion 2 mg/mL 3 mcg/kg/min (09/10/20 0800)  . fentaNYL infusion INTRAVENOUS 300  mcg/hr (09/10/20 0800)  . midazolam 10 mg/hr (09/10/20 0800)  . norepinephrine (LEVOPHED) Adult infusion 14 mcg/min (09/10/20 0800)  . prismasol BGK 2/2.5 replacement solution 500 mL/hr at 09/10/20 0730  . prismasol BGK 2/2.5 replacement solution 300 mL/hr at 09/08/20 2012  . prismasol BGK 4/2.5 1,800 mL/hr at 09/10/20 0730  . vasopressin Stopped (09/09/20 0659)    Scheduled Medications: . artificial tears  1 application Both Eyes H4V  . chlorhexidine gluconate (MEDLINE KIT)  15 mL Mouth Rinse BID  . Chlorhexidine Gluconate Cloth  6 each Topical Daily  . docusate  100 mg Per Tube BID  .  feeding supplement (PIVOT 1.5 CAL)  1,000 mL Per Tube Q24H  . hydrocortisone sodium succinate  50 mg Intravenous Q6H  . insulin aspart  0-20 Units Subcutaneous Q4H  . insulin aspart  10 Units Subcutaneous Q4H  . insulin detemir  30 Units Subcutaneous BID  . mouth rinse  15 mL Mouth Rinse 10 times per day  . pantoprazole sodium  40 mg Per Tube BID  . polyethylene glycol  17 g Per Tube Daily    have reviewed scheduled and prn medications.  Physical Exam: General: Critically ill looking female intubated, sedated and on prone position Heart:RRR, s1s2 nl Lungs: Coarse breath sound posteriorly Abdomen:soft,  Extremities:No edema Dialysis Access: Right IJ temporary HD catheter placed by ICU on 11/10.  Joedy Eickhoff Prasad Aragon Scarantino 09/10/2020,8:46 AM  LOS: 9 days  Pager: [phone]

## 2020-09-10 NOTE — Progress Notes (Signed)
NAMENastashia [name], MRN:  [id number], DOB:  1951/02/09, LOS: 9 ADMISSION DATE:  08/31/2020, CONSULTATION DATE:  09/07/2020 REFERRING MD:  Dr Candiss Norse, CHIEF COMPLAINT:  Acute resp failure  Brief History   68 year old female who was previously diagnosed with Covid 08/23/2020.  Admitted 11/2 with AF-RVR, found to have a perforated duodenal underwent exploratory laparotomy with Phillip Heal patch placement.  History of present illness   69 year old black female that presented to the emergency room on 08/31/2020 for worsening of hypoxia and atrial fibrillation with RVR.  She was admitted to medical floor and placed on Cardizem infusion as well as Eliquis.  She then developed abdominal pain and was found to have a perforated duodenal ulcer for which she went to the operating room and had exploratory laparotomy, lysis of adhesions, Graham patch placement.  Onpostop day 6,developed bleeding from the surgical site.  Reported to a saturated dressing.  Patient was noted to have a hemoglobin change from 9-6.9.  During the same period time she had waxing waning blood pressure.  Developed hemorrhagic shock requiring pressors, blood transfusion and intubation  Past Medical History  Covid pneumonia Atrial fibrillation CKD stage III Diabetes mellitus Hypertension Colon cancer Hyperlipidemia  Significant Hospital Events   11/2 Admitted  11/3 OR -> perforated duodenal ulcer 10/10 progressive hemorrhagic shock, intubated, transfused, pressors, proned; started on CRRT in PM  Consults:  Cardiology [facility] Nephrology   Procedures:  11/3 Exploratory laparotomy, Phillip Heal patch, lysis of adhesion for duodenal ulceration postop day 6  R PICC 11/5 >> A line 11/9 >> ETT 11/9 >> Lt [place] CVL 11/9 >> R IJ trialysis >>   Significant Diagnostic Tests:  11/3 CT abd/ pelvis >> 1. Positive for bowel perforation: Pneumoperitoneum and intermediate density free fluid in the abdomen. Prior total colectomy. The specific site of  perforation is unclear-oral contrast present to the proximal jejunum has not obviously leaked. Note that there may be small bowel loops adherent to the ventral abdominal wall along the greater curve of the stomach. 2. Extensive bilateral lower lung pneumonia. No pleural effusion. 3. Other abdominal and pelvic viscera are stable since 2015, including bilateral adrenal adenomas. Chronic renal parapelvic cysts. 4. Aortic Atherosclerosis  11/3 TTE >> EF 70-75%, RV not well visualized, mildly reduced RV systolic function  40/98 CT chest/ abd/ pelvis>> 1. Interval progression of diffuse bilateral hazy ground-glass airspace opacities with more focal areas of consolidation at the lung bases 2. Trace bilateral pleural effusions. 3. Postsurgical changes the abdomen as detailed above. No evidence for a postoperative abscess, however evaluation is limited by lack of IV contrast. 4. There is a 1.9 cm cystic appearing lesion located in the pancreatic body. This was not present on the patient's CT from 2015.  Follow-up with an outpatient contrast enhanced MRI is recommended. 5. The endometrial stripe appears diffusely thickened. Follow-up with pelvic ultrasound is recommended. Aortic Atherosclerosis  11/10 LE doppler studies >>  Micro Data:  11/10 MRSA PCR >> neg 10/10 BC x 2 >>  10/10 trach asp >>  Antimicrobials:  azithro 11/2 >>11/3 Ceftriaxone 11/2  Fluconazole 11/3 Zosyn 11/3 >> 11/7 Vanc 11/10 Cefepime 11/10 >>  Subjective:   Patient remains critically ill on multiple vasopressors, CVVHD, continuous sedation and paralysis.  Objective   Blood pressure 100/60, pulse (!) 52, temperature (!) (P) 96.9 F (36.1 C), temperature source (P) Axillary, resp. rate (!) 30, height 5\' 8"  (1.727 m), weight 94.3 kg, SpO2 98 %.    Vent Mode: PRVC FiO2 (%):  [  80 %-100 %] 80 % Set Rate:  [26 bmp-30 bmp] 30 bmp Vt Set:  [400 mL-510 mL] 400 mL PEEP:  [14 cmH20] 14 cmH20 Plateau Pressure:  [32 cmH20-35  cmH20] 35 cmH20   Intake/Output Summary (Last 24 hours) at 09/10/2020 0829 Last data filed at 09/10/2020 0600 Gross per 24 hour  Intake 2057.02 ml  Output 4294 ml  Net -2236.98 ml   Filed Weights   09/08/20 1930 09/09/20 0245 09/10/20 0327  Weight: 91.9 kg 91.9 kg 94.3 kg    Examination: General: Female intubated on mechanical life support critically ill sedated and paralyzed HEENT: NCAT, endotracheal tube in place Neuro: Heavily sedated and paralyzed CV: Regular rate rhythm, S1-S2 PULM: Bilateral mechanically ventilated breath sounds GI: Prone positioning she does have a JP drain. Extremities: Trace dependent edema Skin: No rash   Assessment & Plan:   Shock, multifactorial,  hemorrhagic and septic Chest x-ray with left lower lobe infiltrate. -Continue to wean norepinephrine and vasopressin to maintain mean arterial pressure greater than 65. -Conservative transfusion threshold for hemoglobin less than 7. -Wean stress dose steroids -Continue cefepime  ARDS COVID pneumonia,  possible secondary bacterial HCAP  Continue mechanical ventilation per ARDS protocol Target TVol 6-8cc/kgIBW Target Plateau Pressure < 30cm H20 Target driving pressure less than 15 cm of water Target PaO2 55-65: titrate PEEP/FiO2 per protocol As long as PaO2 to FiO2 ratio is less than 1:150 position in prone position for 16 hours a day Check CVP daily if CVL in place Target CVP less than 4 Ventilator associated pneumonia prevention protocol Stop continuous Cis-atracurium Start low-dose as needed VAC Attempt to wean sedation. unprone planned for today  ABG following this   Contact precautions -Patient has reached 21-day window however due to critical illness and progressive ARDS multiorgan failure state after discussion with infectious disease recommending continuation of contact precautions.  Acute blood loss anemia -CT abdomen stable. -Conservative transfusion threshold  Atrial  fibrillation/RVR  -Holding anticoagulation due to thrombocytopenia  AKI - oliguric likely ATN w/ hyperkalemia -CRRT per nephrology, follow renal panel and urine output  Duodenal perforation/ pneumoperitoneum   - S/pexploratory laparotomy, adhesiolysis, modified graham patch repair of perforated duodenal ulcer11/3; UGI neg for leak 11/8 -Follow JP output -Postop care per general surgery  Acute liver injury Transaminitis, AST 6033 / ALT 2573    (previously 59 / 56 on 11/9) - ? 2/2 TPN vs shock liver  -Suspect related to shock liver -TPN discontinued -Continue to observe -Repeat CMP today  Thrombocytopenia  - platelets 201-> 168-> 65 - DIC panel neg - pending HIT antibody (previously on lovenox) -Lower extremity Dopplers pending -Continue to observe. -Holding systemic anticoagulation  1.9 cm cystic appearing lesion located in the pancreatic body - found on CT 11/11 -Outpatient follow-up.  Best practice:  Diet: NPO; advancing TF Pain/Anxiety/Delirium protocol (if indicated):Versed/ fent goal RASS -5 VAP protocol (if indicated): yes DVT prophylaxis: SCDs GI prophylaxis: Protonix BID Glucose control: Insulin gtt transitioned to SSI/ levemir  Mobility: Bedrest Code Status: Full Family Communication: team to update family  Disposition: ICU  Labs   CBC: Recent Labs  Lab 09/04/20 0514 09/05/20 0355 09/06/20 0320 09/06/20 0320 09/07/20 0118 09/07/20 0118 09/07/20 1625 09/07/20 2159 09/07/20 2202 09/08/20 0148 09/08/20 0446 09/08/20 1007 09/08/20 1136 09/08/20 1802 09/09/20 0428 09/09/20 0755  WBC 12.4*   < > 11.0*   < > 12.5*  --  20.2*  --  34.5*  --  28.7*  --   --   --  23.7*  --   NEUTROABS 11.7*  --  9.6*  --   --   --   --   --   --   --   --   --   --   --   --   --   HGB 10.1*   < > 9.9*   < > 9.2*   < > 6.9*   < > 8.8*   < > 7.8* 7.8* 7.8* 7.1* 7.5*  --   HCT 32.7*   < > 33.1*   < > 30.4*   < > 22.9*   < > 28.3*   < > 24.1* 23.0* 23.6* 21.0* 22.3*  --    MCV 75.2*   < > 75.7*   < > 76.2*  --  76.8*  --  81.6  --  81.1  --   --   --  78.0*  --   PLT 291   < > 248   < > 233   < > 216  --  201  --  168  --   --   --  65* 68*   < > = values in this interval not displayed.    Basic Metabolic Panel: Recent Labs  Lab 09/05/20 0355 09/05/20 0355 09/06/20 0320 09/06/20 0320 09/07/20 0118 09/07/20 1625 09/08/20 0446 09/08/20 0446 09/08/20 1000 09/08/20 1007 09/08/20 1800 09/08/20 1802 09/08/20 2218 09/09/20 0428 09/09/20 1600  NA 153*   < > 150*   < > 146*   < > 144   < > 141   < > 138 140 136 136  136 136  K 4.0   < > 4.7   < > 4.3   < > 6.0*   < > 6.2*   < > 6.4* 6.3* 5.4* 4.1  4.0 4.7  CL 119*   < > 118*   < > 115*   < > 113*   < > 112*  --  109  --  106 105  106 104  CO2 27   < > 26   < > 25   < > 16*   < > 19*  --  18*  --  19* 22  21* 24  GLUCOSE 261*   < > 255*   < > 263*   < > 139*   < > 310*  --  375*  --  350* 233*  231* 224*  BUN 37*   < > 32*   < > 26*   < > 39*   < > 51*  --  60*  --  63* 52*  52* 41*  CREATININE 1.02*   < > 0.86   < > 0.75   < > 1.71*   < > 1.92*  --  2.34*  --  2.06* 1.65*  1.60* 1.44*  CALCIUM 9.4   < > 9.4   < > 9.7   < > 9.2   < > 8.4*  --  8.3*  --  8.0* 7.4*  7.4* 7.6*  MG 1.8  --  2.0  --  1.6*  --  2.3  --   --   --   --   --   --  2.2  --   PHOS 2.0*   < > 2.5  --  2.5  --  8.6*  --   --   --   --   --   --  4.6  4.4 4.2   < > =  values in this interval not displayed.   GFR: Estimated Creatinine Clearance: 44.3 mL/min (A) (by C-G formula based on SCr of 1.44 mg/dL (H)). Recent Labs  Lab 09/07/20 1625 09/07/20 1625 09/07/20 2202 09/07/20 2232 09/08/20 0446 09/08/20 1212 09/08/20 2218 09/09/20 0428 09/10/20 0329  PROCALCITON 0.27   < > 0.61  --  7.57  --   --  12.02 10.16  WBC 20.2*  --  34.5*  --  28.7*  --   --  23.7*  --   LATICACIDVEN  --   --   --  9.9* 9.4* 4.1* 2.5*  --   --    < > = values in this interval not displayed.    Liver Function Tests: Recent Labs  Lab  09/04/20 0514 09/04/20 0514 09/05/20 0355 09/06/20 0320 09/07/20 0118 09/09/20 0428 09/09/20 1600  AST 20  --  35 60* 59* 6,033*  --   ALT 20  --  22 43 56* 2,573*  --   ALKPHOS 65  --  91 114 85 105  --   BILITOT 0.5  --  0.4 0.1* 0.6 1.2  --   PROT 5.4*  --  5.0* 5.1* 4.9* 3.9*  --   ALBUMIN 1.9*   < > 1.8* 1.8* 1.7* 1.8*  1.8* 2.0*   < > = values in this interval not displayed.   No results for input(s): LIPASE, AMYLASE in the last 168 hours. No results for input(s): AMMONIA in the last 168 hours.  ABG    Component Value Date/Time   PHART 7.258 (L) 09/09/2020 1800   PCO2ART 56.3 (H) 09/09/2020 1800   PO2ART 100 09/09/2020 1800   HCO3 24.7 09/09/2020 1800   TCO2 21 (L) 09/08/2020 1802   ACIDBASEDEF 1.7 09/09/2020 1800   O2SAT 96.7 09/09/2020 1800     This patient is critically ill with multiple organ system failure; which, requires frequent high complexity decision making, assessment, support, evaluation, and titration of therapies. This was completed through the application of advanced monitoring technologies and extensive interpretation of multiple databases. During this encounter critical care time was devoted to patient care services described in this note for 55 minutes.  Garner Nash, DO [facility] 09/10/2020 8:29 AM

## 2020-09-11 ENCOUNTER — Inpatient Hospital Stay (HOSPITAL_COMMUNITY): Payer: Medicare Other

## 2020-09-11 DIAGNOSIS — I4891 Unspecified atrial fibrillation: Secondary | ICD-10-CM | POA: Diagnosis not present

## 2020-09-11 DIAGNOSIS — J1282 Pneumonia due to coronavirus disease 2019: Secondary | ICD-10-CM | POA: Diagnosis not present

## 2020-09-11 DIAGNOSIS — U071 COVID-19: Secondary | ICD-10-CM | POA: Diagnosis not present

## 2020-09-11 LAB — GLUCOSE, CAPILLARY
Glucose-Capillary: 211 mg/dL — ABNORMAL HIGH (ref 70–99)
Glucose-Capillary: 171 mg/dL — ABNORMAL HIGH (ref 70–99)
Glucose-Capillary: 161 mg/dL — ABNORMAL HIGH (ref 70–99)
Glucose-Capillary: 136 mg/dL — ABNORMAL HIGH (ref 70–99)
Glucose-Capillary: 164 mg/dL — ABNORMAL HIGH (ref 70–99)
Glucose-Capillary: 168 mg/dL — ABNORMAL HIGH (ref 70–99)

## 2020-09-11 LAB — COMPREHENSIVE METABOLIC PANEL
ALT: 2400 U/L — ABNORMAL HIGH (ref 0–44)
AST: 1002 U/L — ABNORMAL HIGH (ref 15–41)
Albumin: 1.9 g/dL — ABNORMAL LOW (ref 3.5–5.0)
Alkaline Phosphatase: 236 U/L — ABNORMAL HIGH (ref 38–126)
Anion gap: 7 (ref 5–15)
BUN: 31 mg/dL — ABNORMAL HIGH (ref 8–23)
CO2: 24 mmol/L (ref 22–32)
Calcium: 8.2 mg/dL — ABNORMAL LOW (ref 8.9–10.3)
Chloride: 103 mmol/L (ref 98–111)
Creatinine, Ser: 1.1 mg/dL — ABNORMAL HIGH (ref 0.44–1.00)
GFR, Estimated: 54 mL/min — ABNORMAL LOW (ref >60)
Glucose, Bld: 207 mg/dL — ABNORMAL HIGH (ref 70–99)
Potassium: 4 mmol/L (ref 3.5–5.1)
Sodium: 134 mmol/L — ABNORMAL LOW (ref 135–145)
Total Bilirubin: 1 mg/dL (ref 0.3–1.2)
Total Protein: 5 g/dL — ABNORMAL LOW (ref 6.5–8.1)

## 2020-09-11 LAB — RENAL FUNCTION PANEL
Albumin: 1.8 g/dL — ABNORMAL LOW (ref 3.5–5.0)
Albumin: 1.7 g/dL — ABNORMAL LOW (ref 3.5–5.0)
Anion gap: 6 (ref 5–15)
Anion gap: 5 (ref 5–15)
BUN: 31 mg/dL — ABNORMAL HIGH (ref 8–23)
BUN: 27 mg/dL — ABNORMAL HIGH (ref 8–23)
CO2: 25 mmol/L (ref 22–32)
CO2: 25 mmol/L (ref 22–32)
Calcium: 8.2 mg/dL — ABNORMAL LOW (ref 8.9–10.3)
Calcium: 8.4 mg/dL — ABNORMAL LOW (ref 8.9–10.3)
Chloride: 104 mmol/L (ref 98–111)
Chloride: 106 mmol/L (ref 98–111)
Creatinine, Ser: 1.05 mg/dL — ABNORMAL HIGH (ref 0.44–1.00)
Creatinine, Ser: 0.93 mg/dL (ref 0.44–1.00)
GFR, Estimated: 58 mL/min — ABNORMAL LOW (ref >60)
GFR, Estimated: >60 mL/min (ref >60)
Glucose, Bld: 206 mg/dL — ABNORMAL HIGH (ref 70–99)
Glucose, Bld: 170 mg/dL — ABNORMAL HIGH (ref 70–99)
Phosphorus: 3 mg/dL (ref 2.5–4.6)
Phosphorus: 3 mg/dL (ref 2.5–4.6)
Potassium: 4 mmol/L (ref 3.5–5.1)
Potassium: 4.4 mmol/L (ref 3.5–5.1)
Sodium: 135 mmol/L (ref 135–145)
Sodium: 136 mmol/L (ref 135–145)

## 2020-09-11 LAB — BLOOD GAS, ARTERIAL
Acid-base deficit: 0.2 mmol/L (ref 0.0–2.0)
Bicarbonate: 26.1 mmol/L (ref 20.0–28.0)
FIO2: 50
O2 Saturation: 94.3 %
Patient temperature: 35.2
pCO2 arterial: 56 mmHg — ABNORMAL HIGH (ref 32.0–48.0)
pH, Arterial: 7.279 — ABNORMAL LOW (ref 7.350–7.450)
pO2, Arterial: 68.9 mmHg — ABNORMAL LOW (ref 83.0–108.0)

## 2020-09-11 LAB — CBC
HCT: 24.9 % — ABNORMAL LOW (ref 36.0–46.0)
Hemoglobin: 8.1 g/dL — ABNORMAL LOW (ref 12.0–15.0)
MCH: 25.4 pg — ABNORMAL LOW (ref 26.0–34.0)
MCHC: 32.5 g/dL (ref 30.0–36.0)
MCV: 78.1 fL — ABNORMAL LOW (ref 80.0–100.0)
Platelets: 42 10*3/uL — ABNORMAL LOW (ref 150–400)
RBC: 3.19 MIL/uL — ABNORMAL LOW (ref 3.87–5.11)
RDW: 17 % — ABNORMAL HIGH (ref 11.5–15.5)
WBC: 28.7 10*3/uL — ABNORMAL HIGH (ref 4.0–10.5)
nRBC: 5.3 % — ABNORMAL HIGH (ref 0.0–0.2)

## 2020-09-11 LAB — MAGNESIUM: Magnesium: 2.5 mg/dL — ABNORMAL HIGH (ref 1.7–2.4)

## 2020-09-11 LAB — PROCALCITONIN: Procalcitonin: 5.9 ng/mL

## 2020-09-11 MED ORDER — SODIUM CHLORIDE 0.9 % IV SOLN
2.0000 g | Freq: Three times a day (TID) | INTRAVENOUS | Status: DC
  Administered 2020-09-11 – 2020-09-12 (×3): 2 g via INTRAVENOUS
  Filled 2020-09-11 (×3): qty 2

## 2020-09-11 NOTE — Plan of Care (Signed)
  Problem: Clinical Measurements: Goal: Respiratory complications will improve Outcome: Progressing Goal: Cardiovascular complication will be avoided Outcome: Progressing   

## 2020-09-11 NOTE — Progress Notes (Addendum)
Pharmacy Antibiotic Note  Candace [name] is a 69 y.o. female admitted on 08/31/2020 with worsening shortness of breath and COVID-19. Chest CT on 11/10 shows bilateral lung opacities and there is now the concern for pneumonia. Pharmacy has been consulted for vancomycin and cefepime dosing. Renal function is variable. Afebrile, Scr 1.71, WBC 28.7, CrCl 38.3 mL/min.   Day 4 of Cefepime, Scr 1.05 on CRRT  Plan: Continue cefepime IV 2 g q12h - Monitor renal function, clinical status, cultures and length of therapy - Deescalate therapy as clinically indicated  Height: 5\' 8"  (172.7 cm) Weight: 89.8 kg (197 lb 15.6 oz) IBW/kg (Calculated) : 63.9  Temp (24hrs), Avg:96.1 F (35.6 C), Min:93.9 F (34.4 C), Max:98.9 F (37.2 C)  Recent Labs  Lab 09/07/20 1625 09/07/20 1625 09/07/20 2202 09/07/20 2232 09/08/20 0446 09/08/20 1000 09/08/20 1212 09/08/20 1800 09/08/20 2218 09/08/20 2218 09/09/20 0428 09/09/20 1600 09/10/20 0329 09/10/20 1822 09/11/20 0314 09/11/20 1004  WBC 20.2*  --  34.5*  --  28.7*  --   --   --   --   --  23.7*  --   --   --   --  28.7*  CREATININE 0.93   < >  --   --  1.71*   < >  --    < > 2.06*   < > 1.65*  1.60* 1.44* 1.19* 1.20* 1.10*  1.05*  --   LATICACIDVEN  --   --   --  9.9* 9.4*  --  4.1*  --  2.5*  --   --   --   --   --   --   --    < > = values in this interval not displayed.    Estimated Creatinine Clearance: 59.3 mL/min (A) (by C-G formula based on SCr of 1.05 mg/dL (H)).    No Known Allergies  Antimicrobials this admission: 10/31 levofloxacin x1 dose 11/2 azithromycin > 11/3  11/2 ceftriaxone x1 dose 11/3 fluconazole x1 dose 11/3 Zosyn > 11/7  Dose adjustments this admission: None  Microbiology results: 11/10 BCx: sent 10/25 [place] NGTD 11/10 Tracheal Aspirate: sent 11/10 MRSA PCR: sent 10/26 MRSA PCR: negative  Thank you for allowing pharmacy to be a part of this patient's care.  Alanda Slim, PharmD, [facility] Clinical  Pharmacist Please see AMION for all Pharmacists' Contact Phone Numbers 09/11/2020, 11:27 AM

## 2020-09-11 NOTE — Plan of Care (Signed)
  Problem: Activity: Goal: Ability to tolerate increased activity will improve Outcome: Progressing   Problem: Respiratory: Goal: Ability to maintain a clear airway and adequate ventilation will improve Outcome: Progressing   Problem: Role Relationship: Goal: Method of communication will improve Outcome: Not Progressing   Problem: Education: Goal: Knowledge of disease and its progression will improve Outcome: Not Progressing   Problem: Clinical Measurements: Goal: Complications related to the disease process, condition or treatment will be avoided or minimized Outcome: Not Progressing

## 2020-09-11 NOTE — Progress Notes (Signed)
Patient starting to double stack breaths on ventilator and desat to 89% when fentanyl drip was decreased to 75 mcg and FiO2 decreased to 40%. Increased FiO2 back to 50%, increased fentanyl back to 100 mcg and gave the patient a 25 mcg bolus. Patient now synchronous with ventilator and oxygen saturation at 95%. Continuing to monitor patient.

## 2020-09-11 NOTE — Progress Notes (Signed)
RT NOTE:  ETT taped and secured @ 24cm with cloth tape. Foam padding placed on cheeks and upper lip. Breakdown on both cheeks. Pt rotated to prone position by RT and RN x4. Head turned to right and arms rotated. Pt tolerated well.

## 2020-09-11 NOTE — Progress Notes (Signed)
NAMEShemia [name], MRN:  [id number], DOB:  October 25, 1951, LOS: 47 ADMISSION DATE:  08/31/2020, CONSULTATION DATE:  09/07/2020 REFERRING MD:  Dr Candiss Norse, CHIEF COMPLAINT:  Acute resp failure  Brief History   69 year old female who was previously diagnosed with Covid 08/23/2020.  Admitted 11/2 with AF-RVR, found to have a perforated duodenal underwent exploratory laparotomy with Phillip Heal patch placement.  History of present illness   69 year old black female that presented to the emergency room on 08/31/2020 for worsening of hypoxia and atrial fibrillation with RVR.  She was admitted to medical floor and placed on Cardizem infusion as well as Eliquis.  She then developed abdominal pain and was found to have a perforated duodenal ulcer for which she went to the operating room and had exploratory laparotomy, lysis of adhesions, Graham patch placement.  Onpostop day 6,developed bleeding from the surgical site.  Reported to a saturated dressing.  Patient was noted to have a hemoglobin change from 9-6.9.  During the same period time she had waxing waning blood pressure.  Developed hemorrhagic shock requiring pressors, blood transfusion and intubation  Past Medical History  Covid pneumonia Atrial fibrillation CKD stage III Diabetes mellitus Hypertension Colon cancer Hyperlipidemia  Significant Hospital Events   11/2 Admitted  11/3 OR -> perforated duodenal ulcer 10/10 progressive hemorrhagic shock, intubated, transfused, pressors, proned; started on CRRT in PM  Consults:  Cardiology [facility] Nephrology   Procedures:  11/3 Exploratory laparotomy, Phillip Heal patch, lysis of adhesion for duodenal ulceration postop day 6  R PICC 11/5 >> A line 11/9 >> ETT 11/9 >> Lt [place] CVL 11/9 >> R IJ trialysis >>   Significant Diagnostic Tests:  11/3 CT abd/ pelvis >> 1. Positive for bowel perforation: Pneumoperitoneum and intermediate density free fluid in the abdomen. Prior total colectomy. The specific site of  perforation is unclear-oral contrast present to the proximal jejunum has not obviously leaked. Note that there may be small bowel loops adherent to the ventral abdominal wall along the greater curve of the stomach. 2. Extensive bilateral lower lung pneumonia. No pleural effusion. 3. Other abdominal and pelvic viscera are stable since 2015, including bilateral adrenal adenomas. Chronic renal parapelvic cysts. 4. Aortic Atherosclerosis  11/3 TTE >> EF 70-75%, RV not well visualized, mildly reduced RV systolic function  40/98 CT chest/ abd/ pelvis>> 1. Interval progression of diffuse bilateral hazy ground-glass airspace opacities with more focal areas of consolidation at the lung bases 2. Trace bilateral pleural effusions. 3. Postsurgical changes the abdomen as detailed above. No evidence for a postoperative abscess, however evaluation is limited by lack of IV contrast. 4. There is a 1.9 cm cystic appearing lesion located in the pancreatic body. This was not present on the patient's CT from 2015.  Follow-up with an outpatient contrast enhanced MRI is recommended. 5. The endometrial stripe appears diffusely thickened. Follow-up with pelvic ultrasound is recommended. Aortic Atherosclerosis  11/10 LE doppler studies >>  Micro Data:  11/10 MRSA PCR >> neg 10/10 BC x 2 >>  10/10 trach asp >>  Antimicrobials:  azithro 11/2 >>11/3 Ceftriaxone 11/2  Fluconazole 11/3 Zosyn 11/3 >> 11/7 Vanc 11/10 Cefepime 11/10 >>  Subjective:   Patient remains critically ill intubated on mechanical life support sedated prone paralyzed on CVVHD.  Objective   Blood pressure (!) 94/58, pulse 80, temperature 98.3 F (36.8 C), temperature source Axillary, resp. rate (!) 30, height 5\' 8"  (1.727 m), weight 89.8 kg, SpO2 98 %.    Vent Mode: PRVC FiO2 (%):  [60 %-  80 %] 60 % Set Rate:  [30 bmp] 30 bmp Vt Set:  [400 mL] 400 mL PEEP:  [14 cmH20] 14 cmH20 Plateau Pressure:  [34 cmH20-36 cmH20] 36 cmH20    Intake/Output Summary (Last 24 hours) at 09/11/2020 0708 Last data filed at 09/11/2020 0700 Gross per 24 hour  Intake 2790.9 ml  Output 4080 ml  Net -1289.1 ml   Filed Weights   09/09/20 0245 09/10/20 0327 09/11/20 0213  Weight: 91.9 kg 94.3 kg 89.8 kg    Examination: General: Female, intubated, proned on mechanical life support's heavily sedated HEENT: NCAT, endotracheal tube in place, skin breakdown on the face from the tube holder Neuro: Sedated now off continuous paralysis CV: Regular rate rhythm, S1-S2 PULM: Bilateral mechanically ventilated breath sounds GI: Prone positioning unable to examine abdomen Extremities: Trace bilateral lower extremity edema Skin: No rash   Assessment & Plan:   Shock, multifactorial,  hemorrhagic and septic Chest x-ray with left lower lobe infiltrate.,  Repeat chest x-ray looks a little bit better. Plan: Continue to wean norepinephrine and vasopressin to maintain mean arterial pressure greater than 65 mmHg.  Nursing staff instructed to wean norepinephrine first.  Continue vasopressin at this time. -Conservative transfusion threshold for hemoglobin less than 7 -Continue to wean off of the stress dose steroids -Drop hydrocortisone to 50 mg daily for the next 3 days then stop. -Continue cefepime  ARDS COVID pneumonia,  possible secondary bacterial HCAP  Continue mechanical ventilation per ARDS protocol Target TVol 6-8cc/kgIBW Target Plateau Pressure < 30cm H20 Target driving pressure less than 15 cm of water Target PaO2 55-65: titrate PEEP/FiO2 per protocol As long as PaO2 to FiO2 ratio is less than 1:150 position in prone position for 16 hours a day Check CVP daily if CVL in place Target CVP less than 4, diurese as necessary Ventilator associated pneumonia prevention protocol  Repeat plans for an proning today. Dropped FiO2 to 50% and PEEP to 12.  Asked nursing staff to instruct RT of these changes. Continuous paralysis stopped  yesterday Decrease Versed infusion by half today would like to come off of this completely. Use as needed's as much as possible. Continue fentanyl infusion.  Contact precautions -Patient has reached 21-day window however due to critical illness and progressive ARDS multiorgan failure state after discussion with infectious disease recommending continuation of contact precautions.  Acute blood loss anemia -CT abdomen stable -Conservative transfusion threshold for hemoglobin less than 7. -Repeat CBC  Atrial fibrillation/RVR  -Holding anticoagulation due to thrombocytopenia at this time.  AKI - oliguric likely ATN w/ hyperkalemia -CRRT per nephrology, follow renal panel and urine output  Duodenal perforation/ pneumoperitoneum   - S/pexploratory laparotomy, adhesiolysis, modified graham patch repair of perforated duodenal ulcer11/3; UGI neg for leak 11/8 -Follow JP output -Postop care per general surgery, we appreciate their input  Acute liver injury Transaminitis, AST 6033 / ALT 2573    (previously 30 / 56 on 11/9) - ? 2/2 TPN vs shock liver  -Suspect related to shock liver Plan: TPN discontinued Continue tube feeds Continue to observe liver function, slowly improving  Thrombocytopenia  - platelets 201-> 168-> 65 - DIC panel neg - pending HIT antibody (previously on lovenox) Lower extremity Dopplers pending -Holding systemic anticoagulation  1.9 cm cystic appearing lesion located in the pancreatic body - found on CT 11/11 -Outpatient follow-up  Best practice:  Diet: NPO; advancing TF Pain/Anxiety/Delirium protocol (if indicated): Coming off Versed infusion today, continuous fentanyl VAP protocol (if indicated): yes DVT prophylaxis: SCDs  GI prophylaxis: Protonix BID Glucose control: Insulin gtt transitioned to SSI/ levemir  Mobility: Bedrest Code Status: Full Family Communication: Called and spoke with the patient's daughter please see separate  documentation Disposition: ICU  Labs   CBC: Recent Labs  Lab 09/06/20 0320 09/06/20 0320 09/07/20 0118 09/07/20 0118 09/07/20 1625 09/07/20 2159 09/07/20 2202 09/08/20 0148 09/08/20 0446 09/08/20 1007 09/08/20 1136 09/08/20 1802 09/09/20 0428 09/09/20 0755  WBC 11.0*   < > 12.5*  --  20.2*  --  34.5*  --  28.7*  --   --   --  23.7*  --   NEUTROABS 9.6*  --   --   --   --   --   --   --   --   --   --   --   --   --   HGB 9.9*   < > 9.2*   < > 6.9*   < > 8.8*   < > 7.8* 7.8* 7.8* 7.1* 7.5*  --   HCT 33.1*   < > 30.4*   < > 22.9*   < > 28.3*   < > 24.1* 23.0* 23.6* 21.0* 22.3*  --   MCV 75.7*   < > 76.2*  --  76.8*  --  81.6  --  81.1  --   --   --  78.0*  --   PLT 248   < > 233   < > 216  --  201  --  168  --   --   --  65* 68*   < > = values in this interval not displayed.    Basic Metabolic Panel: Recent Labs  Lab 09/07/20 0118 09/07/20 1625 09/08/20 0446 09/08/20 1000 09/09/20 0428 09/09/20 1600 09/10/20 0329 09/10/20 1822 09/11/20 0314  NA 146*   < > 144   < > 136  136 136 136 133* 134*  135  K 4.3   < > 6.0*   < > 4.1  4.0 4.7 4.4 4.4 4.0  4.0  CL 115*   < > 113*   < > 105  106 104 104 102 103  104  CO2 25   < > 16*   < > 22  21* 24 25 24 24  25   GLUCOSE 263*   < > 139*   < > 233*  231* 224* 184* 211* 207*  206*  BUN 26*   < > 39*   < > 52*  52* 41* 35* 30* 31*  31*  CREATININE 0.75   < > 1.71*   < > 1.65*  1.60* 1.44* 1.19* 1.20* 1.10*  1.05*  CALCIUM 9.7   < > 9.2   < > 7.4*  7.4* 7.6* 8.1* 8.2* 8.2*  8.2*  MG 1.6*  --  2.3  --  2.2  --  2.4  --  2.5*  PHOS 2.5  --  8.6*  --  4.6  4.4 4.2 4.1  --  3.0   < > = values in this interval not displayed.   GFR: Estimated Creatinine Clearance: 59.3 mL/min (A) (by C-G formula based on SCr of 1.05 mg/dL (H)). Recent Labs  Lab 09/07/20 1625 09/07/20 1625 09/07/20 2202 09/07/20 2202 09/07/20 2232 09/08/20 0446 09/08/20 1212 09/08/20 2218 09/09/20 0428 09/10/20 0329 09/11/20 0314   PROCALCITON 0.27   < > 0.61   < >  --  7.57  --   --  12.02 10.16 5.90  WBC  20.2*  --  34.5*  --   --  28.7*  --   --  23.7*  --   --   LATICACIDVEN  --   --   --   --  9.9* 9.4* 4.1* 2.5*  --   --   --    < > = values in this interval not displayed.    Liver Function Tests: Recent Labs  Lab 09/06/20 0320 09/06/20 0320 09/07/20 0118 09/07/20 0118 09/09/20 0428 09/09/20 1600 09/10/20 0329 09/10/20 1822 09/11/20 0314  AST 60*  --  59*  --  6,033*  --   --  1,481* 1,002*  ALT 43  --  56*  --  2,573*  --   --  2,693* 2,400*  ALKPHOS 114  --  85  --  105  --   --  173* 236*  BILITOT 0.1*  --  0.6  --  1.2  --   --  1.2 1.0  PROT 5.1*  --  4.9*  --  3.9*  --   --  4.8* 5.0*  ALBUMIN 1.8*   < > 1.7*   < > 1.8*  1.8* 2.0* 2.0* 1.9* 1.9*  1.8*   < > = values in this interval not displayed.   No results for input(s): LIPASE, AMYLASE in the last 168 hours. No results for input(s): AMMONIA in the last 168 hours.  ABG    Component Value Date/Time   PHART 7.232 (L) 09/10/2020 1745   PCO2ART 56.1 (H) 09/10/2020 1745   PO2ART 81.8 (L) 09/10/2020 1745   HCO3 22.9 09/10/2020 1745   TCO2 21 (L) 09/08/2020 1802   ACIDBASEDEF 3.6 (H) 09/10/2020 1745   O2SAT 94.8 09/10/2020 1745     This patient is critically ill with multiple organ system failure; which, requires frequent high complexity decision making, assessment, support, evaluation, and titration of therapies. This was completed through the application of advanced monitoring technologies and extensive interpretation of multiple databases. During this encounter critical care time was devoted to patient care services described in this note for 34 minutes.  Garner Nash, DO [facility] 09/11/2020 7:08 AM

## 2020-09-11 NOTE — Progress Notes (Signed)
[facility] NEPHROLOGY PROGRESS NOTE  Assessment/ Plan: Pt is a 69 y.o. yo female with history of DM, HTN, HLD, colon cancer status post partial colectomy, colostomy who was admitted on 11/2 with A. fib with RVR, Covid pneumonia, found to have perforated duodenal ulcer and underwent exploratory laparotomy surgery, developed hemorrhagic shock, respiratory failure seen as a consultation for acute kidney injury and hyperkalemia.   #Acute kidney injury, oliguric likely ATN due to hemorrhagic shock: Patient initially had AKI with creatinine level of 5.6 on admission due to A. fib with RVR/hemodynamic instability which was improved.  However she developed another episode of AKI after shock.  Urinalysis was contaminated/UTI. CT scan A/P without hydronephrosis. Started CRRT on 11/10 because of oliguria and hyperkalemia. Tolerating well.  Currently on prefilter and post filter 2K bath and dialysate 4K bath.  Continue for now.  #Hyperkalemia due to AKI and intra-abdominal bleeding/PRBC transfusion: Now managed with CRRT . #Hemorrhagic shock: Received blood transfusion.  Currently on Levophed and vasopressin. Maintain MAP >65.   #Perforated duodenal ulcer status post ex lap: General surgery is following.  Currently on tube feed.  #ARDS/recent Covid pneumonia: Currently on mechanical ventilation, prone position.  Per primary team.  Subjective: Seen and examined at ICU in Covid isolation.  She is sedated, on mechanical ventilation and on prone position.  Remains anuric.  Tolerating CRRT well.  No new event. Objective Vital signs in last 24 hours: Vitals:   09/11/20 0900 09/11/20 0915 09/11/20 0945 09/11/20 1000  BP: (!) 100/58 (!) 98/56 (!) 95/57 (!) 97/58  Pulse: 79 76 75 74  Resp:      Temp:      TempSrc:      SpO2: 99% 99% 99% 99%  Weight:      Height:       Weight change: -4.5 kg  Intake/Output Summary (Last 24 hours) at 09/11/2020 1008 Last data filed at 09/11/2020  1000 Gross per 24 hour  Intake 2845.21 ml  Output 3910 ml  Net -1064.79 ml       Labs: Basic Metabolic Panel: Recent Labs  Lab 09/09/20 1600 09/09/20 1600 09/10/20 0329 09/10/20 1822 09/11/20 0314  NA 136   < > 136 133* 134*  135  K 4.7   < > 4.4 4.4 4.0  4.0  CL 104   < > 104 102 103  104  CO2 24   < > 25 24 24  25   GLUCOSE 224*   < > 184* 211* 207*  206*  BUN 41*   < > 35* 30* 31*  31*  CREATININE 1.44*   < > 1.19* 1.20* 1.10*  1.05*  CALCIUM 7.6*   < > 8.1* 8.2* 8.2*  8.2*  PHOS 4.2  --  4.1  --  3.0   < > = values in this interval not displayed.   Liver Function Tests: Recent Labs  Lab 09/09/20 0428 09/09/20 1600 09/10/20 0329 09/10/20 1822 09/11/20 0314  AST 6,033*  --   --  1,481* 1,002*  ALT 2,573*  --   --  2,693* 2,400*  ALKPHOS 105  --   --  173* 236*  BILITOT 1.2  --   --  1.2 1.0  PROT 3.9*  --   --  4.8* 5.0*  ALBUMIN 1.8*  1.8*   < > 2.0* 1.9* 1.9*  1.8*   < > = values in this interval not displayed.   No results for input(s): LIPASE, AMYLASE in the last 168  hours. No results for input(s): AMMONIA in the last 168 hours. CBC: Recent Labs  Lab 09/06/20 0320 09/06/20 0320 09/07/20 0118 09/07/20 0118 09/07/20 1625 09/07/20 2159 09/07/20 2202 09/08/20 0148 09/08/20 0446 09/08/20 1007 09/08/20 1136 09/08/20 1802 09/09/20 0428 09/09/20 0755  WBC 11.0*   < > 12.5*   < > 20.2*  --  34.5*  --  28.7*  --   --   --  23.7*  --   NEUTROABS 9.6*  --   --   --   --   --   --   --   --   --   --   --   --   --   HGB 9.9*   < > 9.2*   < > 6.9*   < > 8.8*   < > 7.8*   < > 7.8* 7.1* 7.5*  --   HCT 33.1*   < > 30.4*   < > 22.9*   < > 28.3*   < > 24.1*   < > 23.6* 21.0* 22.3*  --   MCV 75.7*   < > 76.2*  --  76.8*  --  81.6  --  81.1  --   --   --  78.0*  --   PLT 248   < > 233   < > 216  --  201   < > 168  --   --   --  65* 68*   < > = values in this interval not displayed.   Cardiac Enzymes: No results for input(s): CKTOTAL, CKMB,  CKMBINDEX, TROPONINI in the last 168 hours. CBG: Recent Labs  Lab 09/10/20 1607 09/10/20 1945 09/10/20 2342 09/11/20 0301 09/11/20 0803  GLUCAP 106* 200* 271* 211* 171*    Iron Studies: No results for input(s): IRON, TIBC, TRANSFERRIN, FERRITIN in the last 72 hours. Studies/Results: DG CHEST PORT 1 VIEW  Result Date: 09/11/2020 CLINICAL DATA:  Hypoxia EXAM: PORTABLE CHEST 1 VIEW COMPARISON:  September 09, 2020 FINDINGS: Endotracheal tube tip is 3.8 cm above the carina. Central catheter tips are in the superior vena cava, unchanged. Nasogastric tube no longer evident. No appreciable pneumothorax. Areas of airspace opacity are noted in both lower lung regions and right mid lung, essentially stable. No new opacities are evident. Heart is upper normal in size with pulmonary vascularity normal. No adenopathy. No bone lesions. IMPRESSION: Tube and catheter positions as described without pneumothorax. Multifocal airspace opacity, likely due to multifocal pneumonia, stable. A degree of superimposed pulmonary edema cannot be excluded. Stable cardiac silhouette. Electronically Signed   By: Lowella Grip III M.D.   On: 09/11/2020 09:45   DG Chest Port 1 View  Result Date: 09/09/2020 CLINICAL DATA:  Acute respiratory failure EXAM: PORTABLE CHEST 1 VIEW COMPARISON:  September 08, 2020 FINDINGS: There are persistent bilateral airspace opacities, similar to prior study. There is no pneumothorax. The lines and tubes are essentially stable from prior study. The heart size is stable. There is no acute osseous abnormality. IMPRESSION: Stable lines and tubes.  Unchanged bilateral airspace opacities. Electronically Signed   By: Constance Holster M.D.   On: 09/09/2020 19:23    Medications: Infusions: . ceFEPime (MAXIPIME) IV 200 mL/hr at 09/11/20 1000  . fentaNYL infusion INTRAVENOUS 250 mcg/hr (09/11/20 1000)  . midazolam 3.5 mg/hr (09/11/20 1000)  . norepinephrine (LEVOPHED) Adult infusion 11 mcg/min  (09/11/20 1000)  . prismasol BGK 2/2.5 replacement solution 500 mL/hr at 09/11/20 0043  . prismasol BGK 2/2.5  replacement solution 300 mL/hr at 09/11/20 0044  . prismasol BGK 4/2.5 1,800 mL/hr at 09/11/20 0839  . vasopressin 0.03 Units/min (09/11/20 1000)    Scheduled Medications: . artificial tears  1 application Both Eyes J2I  . B-complex with vitamin C  1 tablet Per Tube Daily  . bisacodyl  10 mg Rectal Once  . chlorhexidine gluconate (MEDLINE KIT)  15 mL Mouth Rinse BID  . Chlorhexidine Gluconate Cloth  6 each Topical Daily  . docusate  100 mg Per Tube BID  . feeding supplement (PIVOT 1.5 CAL)  1,000 mL Per Tube Q24H  . hydrocortisone sodium succinate  50 mg Intravenous Daily  . insulin aspart  0-20 Units Subcutaneous Q4H  . insulin aspart  10 Units Subcutaneous Q4H  . insulin detemir  30 Units Subcutaneous BID  . mouth rinse  15 mL Mouth Rinse 10 times per day  . pantoprazole sodium  40 mg Per Tube BID  . polyethylene glycol  17 g Per Tube Daily    have reviewed scheduled and prn medications.  Physical Exam: General: Critically ill looking female intubated, sedated and on prone position Heart:RRR, s1s2 nl Lungs: Coarse breath sound posteriorly Abdomen:soft,  Extremities:No edema Dialysis Access: Right IJ temporary HD catheter placed by ICU on 11/10.  Chaley Castellanos Prasad Lillee Mooneyhan 09/11/2020,10:08 AM  LOS: 10 days  Pager: [phone]

## 2020-09-11 NOTE — Progress Notes (Signed)
CBC suggested to Elink. No new orders at this time.

## 2020-09-11 NOTE — Progress Notes (Signed)
PCCM:  I called and spoke with the patients daughter.   She was appreciative of the call.   Garner Nash, DO [facility] 09/11/2020 9:00 AM

## 2020-09-11 NOTE — Progress Notes (Signed)
eLink Physician-Brief Progress Note Patient Name: Candace [name] DOB: 02-01-1951 MRN: [id number]   Date of Service  09/11/2020  HPI/Events of Note  Nursing question about pronation. P/F ratio = 68.9/0.5 = 137.8. Since P/F ratio is < 150, continue to prone patient on 16 hours prone and 8 hours supine. Patient placed supine at 4 PM.   eICU Interventions  Prone patient at 12 midnight.      Intervention Category Major Interventions: Hypoxemia - evaluation and management;Respiratory failure - evaluation and management  Lysle Dingwall 09/11/2020, 8:52 PM

## 2020-09-12 ENCOUNTER — Inpatient Hospital Stay (HOSPITAL_COMMUNITY): Payer: Medicare Other

## 2020-09-12 DIAGNOSIS — U071 COVID-19: Secondary | ICD-10-CM

## 2020-09-12 DIAGNOSIS — J1282 Pneumonia due to coronavirus disease 2019: Secondary | ICD-10-CM | POA: Diagnosis not present

## 2020-09-12 DIAGNOSIS — R7989 Other specified abnormal findings of blood chemistry: Secondary | ICD-10-CM | POA: Diagnosis not present

## 2020-09-12 DIAGNOSIS — I4891 Unspecified atrial fibrillation: Secondary | ICD-10-CM | POA: Diagnosis not present

## 2020-09-12 LAB — GLUCOSE, CAPILLARY
Glucose-Capillary: 136 mg/dL — ABNORMAL HIGH (ref 70–99)
Glucose-Capillary: 156 mg/dL — ABNORMAL HIGH (ref 70–99)
Glucose-Capillary: 162 mg/dL — ABNORMAL HIGH (ref 70–99)
Glucose-Capillary: 136 mg/dL — ABNORMAL HIGH (ref 70–99)
Glucose-Capillary: 188 mg/dL — ABNORMAL HIGH (ref 70–99)
Glucose-Capillary: 148 mg/dL — ABNORMAL HIGH (ref 70–99)

## 2020-09-12 LAB — BLOOD GAS, ARTERIAL
Acid-Base Excess: 2 mmol/L (ref 0.0–2.0)
Bicarbonate: 27.3 mmol/L (ref 20.0–28.0)
FIO2: 40
O2 Saturation: 95.6 %
Patient temperature: 36.4
pCO2 arterial: 50.8 mmHg — ABNORMAL HIGH (ref 32.0–48.0)
pH, Arterial: 7.346 — ABNORMAL LOW (ref 7.350–7.450)
pO2, Arterial: 75.6 mmHg — ABNORMAL LOW (ref 83.0–108.0)

## 2020-09-12 LAB — RENAL FUNCTION PANEL
Albumin: 1.7 g/dL — ABNORMAL LOW (ref 3.5–5.0)
Albumin: 1.6 g/dL — ABNORMAL LOW (ref 3.5–5.0)
Anion gap: 5 (ref 5–15)
Anion gap: 6 (ref 5–15)
BUN: 26 mg/dL — ABNORMAL HIGH (ref 8–23)
BUN: 24 mg/dL — ABNORMAL HIGH (ref 8–23)
CO2: 26 mmol/L (ref 22–32)
CO2: 26 mmol/L (ref 22–32)
Calcium: 8.7 mg/dL — ABNORMAL LOW (ref 8.9–10.3)
Calcium: 8.6 mg/dL — ABNORMAL LOW (ref 8.9–10.3)
Chloride: 105 mmol/L (ref 98–111)
Chloride: 103 mmol/L (ref 98–111)
Creatinine, Ser: 0.9 mg/dL (ref 0.44–1.00)
Creatinine, Ser: 0.87 mg/dL (ref 0.44–1.00)
GFR, Estimated: >60 mL/min (ref >60)
GFR, Estimated: >60 mL/min (ref >60)
Glucose, Bld: 148 mg/dL — ABNORMAL HIGH (ref 70–99)
Glucose, Bld: 177 mg/dL — ABNORMAL HIGH (ref 70–99)
Phosphorus: 2.2 mg/dL — ABNORMAL LOW (ref 2.5–4.6)
Phosphorus: 3.5 mg/dL (ref 2.5–4.6)
Potassium: 3.9 mmol/L (ref 3.5–5.1)
Potassium: 4.5 mmol/L (ref 3.5–5.1)
Sodium: 136 mmol/L (ref 135–145)
Sodium: 135 mmol/L (ref 135–145)

## 2020-09-12 LAB — CBC
HCT: 24.4 % — ABNORMAL LOW (ref 36.0–46.0)
Hemoglobin: 8 g/dL — ABNORMAL LOW (ref 12.0–15.0)
MCH: 26 pg (ref 26.0–34.0)
MCHC: 32.8 g/dL (ref 30.0–36.0)
MCV: 79.2 fL — ABNORMAL LOW (ref 80.0–100.0)
Platelets: 39 10*3/uL — ABNORMAL LOW (ref 150–400)
RBC: 3.08 MIL/uL — ABNORMAL LOW (ref 3.87–5.11)
RDW: 17.5 % — ABNORMAL HIGH (ref 11.5–15.5)
WBC: 27.9 10*3/uL — ABNORMAL HIGH (ref 4.0–10.5)
nRBC: 5.2 % — ABNORMAL HIGH (ref 0.0–0.2)

## 2020-09-12 LAB — COMPREHENSIVE METABOLIC PANEL
ALT: 1542 U/L — ABNORMAL HIGH (ref 0–44)
AST: 448 U/L — ABNORMAL HIGH (ref 15–41)
Albumin: 1.7 g/dL — ABNORMAL LOW (ref 3.5–5.0)
Alkaline Phosphatase: 238 U/L — ABNORMAL HIGH (ref 38–126)
Anion gap: 6 (ref 5–15)
BUN: 26 mg/dL — ABNORMAL HIGH (ref 8–23)
CO2: 26 mmol/L (ref 22–32)
Calcium: 8.7 mg/dL — ABNORMAL LOW (ref 8.9–10.3)
Chloride: 104 mmol/L (ref 98–111)
Creatinine, Ser: 0.9 mg/dL (ref 0.44–1.00)
GFR, Estimated: >60 mL/min (ref >60)
Glucose, Bld: 147 mg/dL — ABNORMAL HIGH (ref 70–99)
Potassium: 3.9 mmol/L (ref 3.5–5.1)
Sodium: 136 mmol/L (ref 135–145)
Total Bilirubin: 0.9 mg/dL (ref 0.3–1.2)
Total Protein: 4.9 g/dL — ABNORMAL LOW (ref 6.5–8.1)

## 2020-09-12 LAB — POCT I-STAT 7, (LYTES, BLD GAS, ICA,H+H)
Acid-Base Excess: 3 mmol/L — ABNORMAL HIGH (ref 0.0–2.0)
Bicarbonate: 27.9 mmol/L (ref 20.0–28.0)
Calcium, Ion: 1.27 mmol/L (ref 1.15–1.40)
HCT: 22 % — ABNORMAL LOW (ref 36.0–46.0)
Hemoglobin: 7.5 g/dL — ABNORMAL LOW (ref 12.0–15.0)
O2 Saturation: 97 %
Patient temperature: 97
Potassium: 4.4 mmol/L (ref 3.5–5.1)
Sodium: 134 mmol/L — ABNORMAL LOW (ref 135–145)
TCO2: 29 mmol/L (ref 22–32)
pCO2 arterial: 42.2 mmHg (ref 32.0–48.0)
pH, Arterial: 7.424 (ref 7.350–7.450)
pO2, Arterial: 91 mmHg (ref 83.0–108.0)

## 2020-09-12 LAB — PROCALCITONIN: Procalcitonin: 5.29 ng/mL

## 2020-09-12 LAB — MAGNESIUM: Magnesium: 2.4 mg/dL (ref 1.7–2.4)

## 2020-09-12 MED ORDER — SODIUM CHLORIDE 0.9 % IV SOLN
2.0000 g | Freq: Two times a day (BID) | INTRAVENOUS | Status: AC
  Administered 2020-09-12 – 2020-09-14 (×5): 2 g via INTRAVENOUS
  Filled 2020-09-12 (×5): qty 2

## 2020-09-12 MED ORDER — ATROPINE SULFATE 1 MG/10ML IJ SOSY
PREFILLED_SYRINGE | INTRAMUSCULAR | Status: AC
  Filled 2020-09-12: qty 10

## 2020-09-12 MED ORDER — POTASSIUM PHOSPHATES 15 MMOLE/5ML IV SOLN
20.0000 mmol | Freq: Once | INTRAVENOUS | Status: AC
  Administered 2020-09-12: 20 mmol via INTRAVENOUS
  Filled 2020-09-12: qty 6.67

## 2020-09-12 NOTE — Progress Notes (Addendum)
[facility] NEPHROLOGY PROGRESS NOTE  Assessment/ Plan: Pt is a 69 y.o. yo female with history of DM, HTN, HLD, colon cancer status post partial colectomy, colostomy who was admitted on 11/2 with A. fib with RVR, Covid pneumonia, found to have perforated duodenal ulcer and underwent exploratory laparotomy surgery, developed hemorrhagic shock, respiratory failure seen as a consultation for acute kidney injury and hyperkalemia.   #Acute kidney injury, oliguric likely ATN due to hemorrhagic shock: Patient initially had AKI with creatinine level of 5.6 on admission due to A. fib with RVR/hemodynamic instability which was improved.  However she developed another episode of AKI after shock.  Urinalysis was contaminated/UTI. CT scan A/P without hydronephrosis. Started CRRT on 11/10 because of oliguria and hyperkalemia. Tolerating well.  Currently on prefilter and post filter 2K bath and dialysate 4K bath.  Potassium acceptable and she remains anuric therefore we will continue CRRT today.  Reassess in the morning.  #Hyperkalemia due to AKI and intra-abdominal bleeding/PRBC transfusion: Now managed with CRRT.  #Hypophosphatemia: Replete phosphate.  #Hemorrhagic shock: Received blood transfusion.  Currently on Levophed and vasopressin. Maintain MAP >65.   #Perforated duodenal ulcer status post ex lap: General surgery is following.  Currently on tube feed.  #ARDS/recent Covid pneumonia: Currently on mechanical ventilation, prone position.  Per primary team.  Subjective: No new event, tolerating CRRT well.  She is sedated, on mechanical ventilation and on prone position.  Remains anuric.  Objective Vital signs in last 24 hours: Vitals:   09/12/20 0700 09/12/20 0734 09/12/20 0800 09/12/20 1000  BP: (!) 103/50  (!) 107/54 (!) 109/57  Pulse: 80  84 77  Resp:      Temp:   98.9 F (37.2 C)   TempSrc:   Oral   SpO2: 96% 97% 98% 96%  Weight:      Height:       Weight change: -1.5  kg  Intake/Output Summary (Last 24 hours) at 09/12/2020 1040 Last data filed at 09/12/2020 1000 Gross per 24 hour  Intake 2273.76 ml  Output 2697 ml  Net -423.24 ml       Labs: Basic Metabolic Panel: Recent Labs  Lab 09/11/20 0314 09/11/20 1646 09/12/20 0252  NA 134*  135 136 136  136  K 4.0  4.0 4.4 3.9  3.9  CL 103  104 106 104  105  CO2 24  25 25 26  26   GLUCOSE 207*  206* 170* 147*  148*  BUN 31*  31* 27* 26*  26*  CREATININE 1.10*  1.05* 0.93 0.90  0.90  CALCIUM 8.2*  8.2* 8.4* 8.7*  8.7*  PHOS 3.0 3.0 2.2*   Liver Function Tests: Recent Labs  Lab 09/10/20 1822 09/10/20 1822 09/11/20 0314 09/11/20 1646 09/12/20 0252  AST 1,481*  --  1,002*  --  448*  ALT 2,693*  --  2,400*  --  1,542*  ALKPHOS 173*  --  236*  --  238*  BILITOT 1.2  --  1.0  --  0.9  PROT 4.8*  --  5.0*  --  4.9*  ALBUMIN 1.9*   < > 1.9*  1.8* 1.7* 1.7*  1.7*   < > = values in this interval not displayed.   No results for input(s): LIPASE, AMYLASE in the last 168 hours. No results for input(s): AMMONIA in the last 168 hours. CBC: Recent Labs  Lab 09/06/20 0320 09/07/20 0118 09/07/20 2202 09/08/20 0148 09/08/20 0034 09/08/20 1007 09/09/20 0428 09/09/20 0428 09/09/20 9179  09/11/20 1004 09/12/20 0252  WBC 11.0*   < > 34.5*   < > 28.7*  --  23.7*  --   --  28.7* 27.9*  NEUTROABS 9.6*  --   --   --   --   --   --   --   --   --   --   HGB 9.9*   < > 8.8*   < > 7.8*   < > 7.5*  --   --  8.1* 8.0*  HCT 33.1*   < > 28.3*   < > 24.1*   < > 22.3*  --   --  24.9* 24.4*  MCV 75.7*   < > 81.6  --  81.1  --  78.0*  --   --  78.1* 79.2*  PLT 248   < > 201   < > 168  --  65*   < > 68* 42* 39*   < > = values in this interval not displayed.   Cardiac Enzymes: No results for input(s): CKTOTAL, CKMB, CKMBINDEX, TROPONINI in the last 168 hours. CBG: Recent Labs  Lab 09/11/20 1548 09/11/20 1936 09/11/20 2329 09/12/20 0256 09/12/20 0814  GLUCAP 136* 164* 168* 136* 156*     Iron Studies: No results for input(s): IRON, TIBC, TRANSFERRIN, FERRITIN in the last 72 hours. Studies/Results: DG CHEST PORT 1 VIEW  Result Date: 09/11/2020 CLINICAL DATA:  COVID-19 positive, intubated EXAM: PORTABLE CHEST 1 VIEW COMPARISON:  09/11/2020 at 7:50 a.m. FINDINGS: Single frontal view of the chest demonstrates stable endotracheal tube, enteric catheter, right internal jugular catheter, left subclavian catheter, and right-sided PICC. Cardiac silhouette is stable. Multifocal basilar predominant airspace disease greatest at the left lung base, stable. Small right pleural effusion is suspected. No pneumothorax. IMPRESSION: 1. Stable support devices. 2. Bibasilar multifocal pneumonia consistent with COVID 19. 3. Small right pleural effusion. Electronically Signed   By: Randa Ngo M.D.   On: 09/11/2020 17:15   DG CHEST PORT 1 VIEW  Result Date: 09/11/2020 CLINICAL DATA:  Hypoxia EXAM: PORTABLE CHEST 1 VIEW COMPARISON:  September 09, 2020 FINDINGS: Endotracheal tube tip is 3.8 cm above the carina. Central catheter tips are in the superior vena cava, unchanged. Nasogastric tube no longer evident. No appreciable pneumothorax. Areas of airspace opacity are noted in both lower lung regions and right mid lung, essentially stable. No new opacities are evident. Heart is upper normal in size with pulmonary vascularity normal. No adenopathy. No bone lesions. IMPRESSION: Tube and catheter positions as described without pneumothorax. Multifocal airspace opacity, likely due to multifocal pneumonia, stable. A degree of superimposed pulmonary edema cannot be excluded. Stable cardiac silhouette. Electronically Signed   By: Lowella Grip III M.D.   On: 09/11/2020 09:45    Medications: Infusions: . ceFEPime (MAXIPIME) IV    . fentaNYL infusion INTRAVENOUS 150 mcg/hr (09/12/20 1018)  . midazolam Stopped (09/12/20 0334)  . norepinephrine (LEVOPHED) Adult infusion 5.5 mcg/min (09/12/20 0700)  .  prismasol BGK 2/2.5 replacement solution 500 mL/hr at 09/12/20 0700  . prismasol BGK 2/2.5 replacement solution 300 mL/hr at 09/11/20 1835  . prismasol BGK 4/2.5 1,800 mL/hr at 09/12/20 0839  . vasopressin 0.03 Units/min (09/12/20 0700)    Scheduled Medications: . artificial tears  1 application Both Eyes E9B  . B-complex with vitamin C  1 tablet Per Tube Daily  . bisacodyl  10 mg Rectal Once  . chlorhexidine gluconate (MEDLINE KIT)  15 mL Mouth Rinse BID  . Chlorhexidine Gluconate  Cloth  6 each Topical Daily  . docusate  100 mg Per Tube BID  . feeding supplement (PIVOT 1.5 CAL)  1,000 mL Per Tube Q24H  . hydrocortisone sodium succinate  50 mg Intravenous Daily  . insulin aspart  0-20 Units Subcutaneous Q4H  . insulin aspart  10 Units Subcutaneous Q4H  . insulin detemir  30 Units Subcutaneous BID  . mouth rinse  15 mL Mouth Rinse 10 times per day  . pantoprazole sodium  40 mg Per Tube BID  . polyethylene glycol  17 g Per Tube Daily    have reviewed scheduled and prn medications.  Physical Exam: General: Critically ill looking female intubated, sedated  Heart:RRR, s1s2 nl Lungs: Coarse breath sound posteriorly Abdomen:soft,  Extremities:No edema Dialysis Access: Right IJ temporary HD catheter placed by ICU on 11/10.  Romel Dumond Prasad Marlos Carmen 09/12/2020,10:40 AM  LOS: 11 days  Pager: [phone]

## 2020-09-12 NOTE — Progress Notes (Signed)
NAMEKaileigh [name], MRN:  [id number], DOB:  [birth year], LOS: 18 ADMISSION DATE:  08/31/2020, CONSULTATION DATE:  09/07/2020 REFERRING MD:  Dr Candiss Norse, CHIEF COMPLAINT:  Acute resp failure  Brief History   69 year old female who was previously diagnosed with Covid 08/23/2020.  Admitted 11/2 with AF-RVR, found to have a perforated duodenal underwent exploratory laparotomy with Phillip Heal patch placement.  History of present illness   69 year old black female that presented to the emergency room on 08/31/2020 for worsening of hypoxia and atrial fibrillation with RVR.  She was admitted to medical floor and placed on Cardizem infusion as well as Eliquis.  She then developed abdominal pain and was found to have a perforated duodenal ulcer for which she went to the operating room and had exploratory laparotomy, lysis of adhesions, Graham patch placement.  Onpostop day 6,developed bleeding from the surgical site.  Reported to a saturated dressing.  Patient was noted to have a hemoglobin change from 9-6.9.  During the same period time she had waxing waning blood pressure.  Developed hemorrhagic shock requiring pressors, blood transfusion and intubation  Past Medical History  Covid pneumonia Atrial fibrillation CKD stage III Diabetes mellitus Hypertension Colon cancer Hyperlipidemia  Significant Hospital Events   11/2 Admitted  11/3 OR -> perforated duodenal ulcer 10/10 progressive hemorrhagic shock, intubated, transfused, pressors, proned; started on CRRT in PM  Consults:  Cardiology [facility] Nephrology   Procedures:  11/3 Exploratory laparotomy, Phillip Heal patch, lysis of adhesion for duodenal ulceration postop day 6  R PICC 11/5 >> A line 11/9 >> ETT 11/9 >> Lt [place] CVL 11/9 >> R IJ trialysis >>   Significant Diagnostic Tests:  11/3 CT abd/ pelvis >> 1. Positive for bowel perforation: Pneumoperitoneum and intermediate density free fluid in the abdomen. Prior total colectomy. The specific site of  perforation is unclear-oral contrast present to the proximal jejunum has not obviously leaked. Note that there may be small bowel loops adherent to the ventral abdominal wall along the greater curve of the stomach. 2. Extensive bilateral lower lung pneumonia. No pleural effusion. 3. Other abdominal and pelvic viscera are stable since 2015, including bilateral adrenal adenomas. Chronic renal parapelvic cysts. 4. Aortic Atherosclerosis  11/3 TTE >> EF 70-75%, RV not well visualized, mildly reduced RV systolic function  40/98 CT chest/ abd/ pelvis>> 1. Interval progression of diffuse bilateral hazy ground-glass airspace opacities with more focal areas of consolidation at the lung bases 2. Trace bilateral pleural effusions. 3. Postsurgical changes the abdomen as detailed above. No evidence for a postoperative abscess, however evaluation is limited by lack of IV contrast. 4. There is a 1.9 cm cystic appearing lesion located in the pancreatic body. This was not present on the patient's CT from 2015.  Follow-up with an outpatient contrast enhanced MRI is recommended. 5. The endometrial stripe appears diffusely thickened. Follow-up with pelvic ultrasound is recommended. Aortic Atherosclerosis  11/10 LE doppler studies >>  Micro Data:  11/10 MRSA PCR >> neg 10/10 BC x 2 >>  10/10 trach asp >>  Antimicrobials:  azithro 11/2 >>11/3 Ceftriaxone 11/2  Fluconazole 11/3 Zosyn 11/3 >> 11/7 Vanc 11/10 Cefepime 11/10 >>  Subjective:   Patient remains critically ill intubated on mechanical life support, proned, CVVHD remains on vasopressors.  Objective   Blood pressure (!) 102/53, pulse 76, temperature (!) 97.5 F (36.4 C), temperature source Axillary, resp. rate (!) 30, height 5\' 8"  (1.727 m), weight 88.3 kg, SpO2 98 %.    Vent Mode: PRVC FiO2 (%):  [  40 %-60 %] 40 % Set Rate:  [30 bmp] 30 bmp Vt Set:  [400 mL] 400 mL PEEP:  [12 cmH20-14 cmH20] 12 cmH20 Plateau Pressure:  [27 cmH20-32  cmH20] 32 cmH20   Intake/Output Summary (Last 24 hours) at 09/12/2020 0725 Last data filed at 09/12/2020 0700 Gross per 24 hour  Intake 2356.34 ml  Output 2709 ml  Net -352.66 ml   Filed Weights   09/10/20 0327 09/11/20 0213 09/12/20 0257  Weight: 94.3 kg 89.8 kg 88.3 kg    Examination: General: Female, intubated on mechanical life support prone HEENT: NCAT, endotracheal tube in place, some skin breakdown along the face of the tube holder Neuro: Sedated, off paralysis at this time CV: Regular rate rhythm, S1-S2 PULM: Bilateral mechanically ventilated breath sounds GI: Prone positioning, abdomen not fully examined Extremities: Trace dependent lower extremity edema Skin: No rash   Assessment & Plan:   Shock, multifactorial,  hemorrhagic and septic Plan: -Continue to wean norepinephrine and vasopressin to maintain mean arterial pressure greater than 65 mils mercury -Conservative transfusion threshold for hemoglobin less than 7 -Weaning stress dose steroids -Complete hydrocortisone taper -Continue cefepime  ARDS COVID pneumonia,  possible secondary bacterial HCAP  Continue mechanical ventilation per ARDS protocol Target TVol 6-8cc/kgIBW Target Plateau Pressure < 30cm H20 Target driving pressure less than 15 cm of water Target PaO2 55-65: titrate PEEP/FiO2 per protocol As long as PaO2 to FiO2 ratio is less than 1:150 position in prone position for 16 hours a day Check CVP daily if CVL in place Target CVP less than 4, diurese as necessary Ventilator associated pneumonia prevention protocol -At this time likely be able to on prone later this afternoon and not reprone. -She is on 40% and 10 of PEEP.  O2 sats remained in the 90s.  Contact precautions -Patient has reached 21-day window however due to critical illness and progressive ARDS multiorgan failure state after discussion with infectious disease recommending continuation of contact precautions.  Acute blood loss  anemia -CT abdomen stable -Postop care per surgery -Continue to observe hemoglobin, to conservative transfusion threshold for hemoglobin less than 7  Atrial fibrillation/RVR  -Continue to hold anticoagulation due to thrombocytopenia  AKI - oliguric likely ATN w/ hyperkalemia -CRRT per nephrology, follow renal panels and urine output.  Duodenal perforation/ pneumoperitoneum   - S/pexploratory laparotomy, adhesiolysis, modified graham patch repair of perforated duodenal ulcer11/3; UGI neg for leak 11/8 -Follow JP output -Postop care per general surgery, we appreciate their input.  Acute liver injury Transaminitis, AST 6033 / ALT 2573    (previously 59 / 56 on 11/9) - ? 2/2 TPN vs shock liver  -Suspect related to shock liver Plan: Liver function continue to improve. Continue tube feeds Continue to observe.  Thrombocytopenia  - platelets 201-> 168-> 65 - DIC panel neg HIT panel negative -Likely related to liver dysfunction and sepsis.  1.9 cm cystic appearing lesion located in the pancreatic body - found on CT 11/11 -Outpatient follow-up  Best practice:  Diet: Tube feeds Pain/Anxiety/Delirium protocol (if indicated): Coming off continuous sedation VAP protocol (if indicated): yes DVT prophylaxis: SCDs GI prophylaxis: Protonix BID Glucose control: Insulin gtt transitioned to SSI/ levemir  Mobility: Bedrest Code Status: Full Family Communication: Daughter has been updated Disposition: ICU  Labs   CBC: Recent Labs  Lab 09/06/20 0320 09/07/20 0118 09/07/20 2202 09/08/20 0148 09/08/20 0446 09/08/20 1007 09/08/20 1136 09/08/20 1802 09/09/20 0428 09/09/20 0755 09/11/20 1004 09/12/20 0252  WBC 11.0*   < > 34.5*  --  28.7*  --   --   --  23.7*  --  28.7* 27.9*  NEUTROABS 9.6*  --   --   --   --   --   --   --   --   --   --   --   HGB 9.9*   < > 8.8*   < > 7.8*   < > 7.8* 7.1* 7.5*  --  8.1* 8.0*  HCT 33.1*   < > 28.3*   < > 24.1*   < > 23.6* 21.0* 22.3*  --   24.9* 24.4*  MCV 75.7*   < > 81.6  --  81.1  --   --   --  78.0*  --  78.1* 79.2*  PLT 248   < > 201   < > 168  --   --   --  65* 68* 42* 39*   < > = values in this interval not displayed.    Basic Metabolic Panel: Recent Labs  Lab 09/08/20 0446 09/08/20 1000 09/09/20 0428 09/09/20 0428 09/09/20 1600 09/09/20 1600 09/10/20 0329 09/10/20 1822 09/11/20 0314 09/11/20 1646 09/12/20 0252  NA 144   < > 136  136   < > 136   < > 136 133* 134*  135 136 136  136  K 6.0*   < > 4.1  4.0   < > 4.7   < > 4.4 4.4 4.0  4.0 4.4 3.9  3.9  CL 113*   < > 105  106   < > 104   < > 104 102 103  104 106 104  105  CO2 16*   < > 22  21*   < > 24   < > 25 24 24  25 25 26  26   GLUCOSE 139*   < > 233*  231*   < > 224*   < > 184* 211* 207*  206* 170* 147*  148*  BUN 39*   < > 52*  52*   < > 41*   < > 35* 30* 31*  31* 27* 26*  26*  CREATININE 1.71*   < > 1.65*  1.60*   < > 1.44*   < > 1.19* 1.20* 1.10*  1.05* 0.93 0.90  0.90  CALCIUM 9.2   < > 7.4*  7.4*   < > 7.6*   < > 8.1* 8.2* 8.2*  8.2* 8.4* 8.7*  8.7*  MG 2.3  --  2.2  --   --   --  2.4  --  2.5*  --  2.4  PHOS 8.6*  --  4.6  4.4   < > 4.2  --  4.1  --  3.0 3.0 2.2*   < > = values in this interval not displayed.   GFR: Estimated Creatinine Clearance: 68.6 mL/min (by C-G formula based on SCr of 0.9 mg/dL). Recent Labs  Lab 09/07/20 2202 09/07/20 2232 09/08/20 0446 09/08/20 0446 09/08/20 1212 09/08/20 2218 09/09/20 0428 09/10/20 0329 09/11/20 0314 09/11/20 1004 09/12/20 0252  PROCALCITON   < >  --  7.57   < >  --   --  12.02 10.16 5.90  --  5.29  WBC   < >  --  28.7*  --   --   --  23.7*  --   --  28.7* 27.9*  LATICACIDVEN  --  9.9* 9.4*  --  4.1* 2.5*  --   --   --   --   --    < > =  values in this interval not displayed.    Liver Function Tests: Recent Labs  Lab 09/07/20 0118 09/07/20 0118 09/09/20 0428 09/09/20 1600 09/10/20 0329 09/10/20 1822 09/11/20 0314 09/11/20 1646 09/12/20 0252  AST 59*  --   6,033*  --   --  1,481* 1,002*  --  448*  ALT 56*  --  2,573*  --   --  2,693* 2,400*  --  1,542*  ALKPHOS 85  --  105  --   --  173* 236*  --  238*  BILITOT 0.6  --  1.2  --   --  1.2 1.0  --  0.9  PROT 4.9*  --  3.9*  --   --  4.8* 5.0*  --  4.9*  ALBUMIN 1.7*   < > 1.8*  1.8*   < > 2.0* 1.9* 1.9*  1.8* 1.7* 1.7*  1.7*   < > = values in this interval not displayed.   No results for input(s): LIPASE, AMYLASE in the last 168 hours. No results for input(s): AMMONIA in the last 168 hours.  ABG    Component Value Date/Time   PHART 7.279 (L) 09/11/2020 1725   PCO2ART 56.0 (H) 09/11/2020 1725   PO2ART 68.9 (L) 09/11/2020 1725   HCO3 26.1 09/11/2020 1725   TCO2 21 (L) 09/08/2020 1802   ACIDBASEDEF 0.2 09/11/2020 1725   O2SAT 94.3 09/11/2020 1725     This patient is critically ill with multiple organ system failure; which, requires frequent high complexity decision making, assessment, support, evaluation, and titration of therapies. This was completed through the application of advanced monitoring technologies and extensive interpretation of multiple databases. During this encounter critical care time was devoted to patient care services described in this note for 32 minutes.  Garner Nash, DO [facility] 09/12/2020 7:25 AM

## 2020-09-12 NOTE — Progress Notes (Signed)
Elink notified about patients heart rate of 48, sustained in upper 40's. ABG obtained. Levo had just been decreased to 4. After increasing Levo back to 5, patients heart rate improved to 53. Atropine pulled and is at bedside. Continuing to monitor patient.

## 2020-09-12 NOTE — Progress Notes (Signed)
eLink Physician-Brief Progress Note Patient Name: Candace [name] DOB: September 17, 1951 MRN: [id number]   Date of Service  09/12/2020  HPI/Events of Note  Bradycardia - HR dropped from 60's to 40's and now HR = 53 (sinus bradycardia). Not on Precedex, Propofol, B-Blockers or Ca++ Channel agents. BP = 117/49 by A-line.   eICU Interventions  Continue to monitor HR.      Intervention Category Major Interventions: Arrhythmia - evaluation and management  Jakeline Dave Cornelia Copa 09/12/2020, 9:56 PM

## 2020-09-12 NOTE — Progress Notes (Signed)
VASCULAR LAB    Bilateral lower extremity venous duplex has been performed.  See CV proc for preliminary results.   Tayshon Winker, RVT 09/12/2020, 7:06 PM

## 2020-09-12 NOTE — Progress Notes (Signed)
RT NOTE:  ETT taped and secured @ 24cm with cloth tape. Foam padding placed on cheeks and upper lip. Breakdown on both cheeks. Pt rotated to prone position by RT and RN x4. Head turned to right and arms rotated. Pt tolerated well.

## 2020-09-12 NOTE — Plan of Care (Signed)
  Problem: Clinical Measurements: Goal: Ability to maintain clinical measurements within normal limits will improve Outcome: Progressing Goal: Diagnostic test results will improve Outcome: Progressing Goal: Respiratory complications will improve Outcome: Progressing Goal: Cardiovascular complication will be avoided Outcome: Not Progressing   Problem: Nutrition: Goal: Adequate nutrition will be maintained Outcome: Progressing

## 2020-09-13 ENCOUNTER — Inpatient Hospital Stay (HOSPITAL_COMMUNITY): Payer: Medicare Other

## 2020-09-13 DIAGNOSIS — U071 COVID-19: Secondary | ICD-10-CM | POA: Diagnosis not present

## 2020-09-13 DIAGNOSIS — I4891 Unspecified atrial fibrillation: Secondary | ICD-10-CM | POA: Diagnosis not present

## 2020-09-13 DIAGNOSIS — J1282 Pneumonia due to coronavirus disease 2019: Secondary | ICD-10-CM | POA: Diagnosis not present

## 2020-09-13 LAB — DIFFERENTIAL
Abs Immature Granulocytes: 1.25 10*3/uL — ABNORMAL HIGH (ref 0.00–0.07)
Basophils Absolute: 0 10*3/uL (ref 0.0–0.1)
Basophils Relative: 0 %
Eosinophils Absolute: 0 10*3/uL (ref 0.0–0.5)
Eosinophils Relative: 0 %
Immature Granulocytes: 6 %
Lymphocytes Relative: 3 %
Lymphs Abs: 0.6 10*3/uL — ABNORMAL LOW (ref 0.7–4.0)
Monocytes Absolute: 0.8 10*3/uL (ref 0.1–1.0)
Monocytes Relative: 4 %
Neutro Abs: 16.9 10*3/uL — ABNORMAL HIGH (ref 1.7–7.7)
Neutrophils Relative %: 87 %

## 2020-09-13 LAB — COMPREHENSIVE METABOLIC PANEL
ALT: 964 U/L — ABNORMAL HIGH (ref 0–44)
AST: 232 U/L — ABNORMAL HIGH (ref 15–41)
Albumin: 1.5 g/dL — ABNORMAL LOW (ref 3.5–5.0)
Alkaline Phosphatase: 237 U/L — ABNORMAL HIGH (ref 38–126)
Anion gap: 5 (ref 5–15)
BUN: 27 mg/dL — ABNORMAL HIGH (ref 8–23)
CO2: 27 mmol/L (ref 22–32)
Calcium: 8.4 mg/dL — ABNORMAL LOW (ref 8.9–10.3)
Chloride: 104 mmol/L (ref 98–111)
Creatinine, Ser: 0.87 mg/dL (ref 0.44–1.00)
GFR, Estimated: >60 mL/min (ref >60)
Glucose, Bld: 199 mg/dL — ABNORMAL HIGH (ref 70–99)
Potassium: 3.8 mmol/L (ref 3.5–5.1)
Sodium: 136 mmol/L (ref 135–145)
Total Bilirubin: 1.2 mg/dL (ref 0.3–1.2)
Total Protein: 4.8 g/dL — ABNORMAL LOW (ref 6.5–8.1)

## 2020-09-13 LAB — RENAL FUNCTION PANEL
Albumin: 1.6 g/dL — ABNORMAL LOW (ref 3.5–5.0)
Anion gap: 8 (ref 5–15)
BUN: 30 mg/dL — ABNORMAL HIGH (ref 8–23)
CO2: 25 mmol/L (ref 22–32)
Calcium: 8.3 mg/dL — ABNORMAL LOW (ref 8.9–10.3)
Chloride: 101 mmol/L (ref 98–111)
Creatinine, Ser: 0.75 mg/dL (ref 0.44–1.00)
GFR, Estimated: >60 mL/min (ref >60)
Glucose, Bld: 270 mg/dL — ABNORMAL HIGH (ref 70–99)
Phosphorus: 2 mg/dL — ABNORMAL LOW (ref 2.5–4.6)
Potassium: 4.4 mmol/L (ref 3.5–5.1)
Sodium: 134 mmol/L — ABNORMAL LOW (ref 135–145)

## 2020-09-13 LAB — PROTIME-INR
INR: 1 (ref 0.8–1.2)
Prothrombin Time: 13 seconds (ref 11.4–15.2)

## 2020-09-13 LAB — CULTURE, BLOOD (ROUTINE X 2)
Culture: NO GROWTH
Culture: NO GROWTH

## 2020-09-13 LAB — CBC
HCT: 21.9 % — ABNORMAL LOW (ref 36.0–46.0)
Hemoglobin: 7.1 g/dL — ABNORMAL LOW (ref 12.0–15.0)
MCH: 25.9 pg — ABNORMAL LOW (ref 26.0–34.0)
MCHC: 32.4 g/dL (ref 30.0–36.0)
MCV: 79.9 fL — ABNORMAL LOW (ref 80.0–100.0)
Platelets: 34 10*3/uL — ABNORMAL LOW (ref 150–400)
RBC: 2.74 MIL/uL — ABNORMAL LOW (ref 3.87–5.11)
RDW: 18.6 % — ABNORMAL HIGH (ref 11.5–15.5)
WBC: 19.5 10*3/uL — ABNORMAL HIGH (ref 4.0–10.5)
nRBC: 6.7 % — ABNORMAL HIGH (ref 0.0–0.2)

## 2020-09-13 LAB — GLUCOSE, CAPILLARY
Glucose-Capillary: 187 mg/dL — ABNORMAL HIGH (ref 70–99)
Glucose-Capillary: 202 mg/dL — ABNORMAL HIGH (ref 70–99)
Glucose-Capillary: 220 mg/dL — ABNORMAL HIGH (ref 70–99)
Glucose-Capillary: 214 mg/dL — ABNORMAL HIGH (ref 70–99)

## 2020-09-13 LAB — PREALBUMIN: Prealbumin: 12.7 mg/dL — ABNORMAL LOW (ref 18–38)

## 2020-09-13 LAB — TRIGLYCERIDES: Triglycerides: 104 mg/dL (ref <150)

## 2020-09-13 LAB — PHOSPHORUS: Phosphorus: 2.7 mg/dL (ref 2.5–4.6)

## 2020-09-13 LAB — MAGNESIUM: Magnesium: 2.3 mg/dL (ref 1.7–2.4)

## 2020-09-13 LAB — APTT: aPTT: 31 seconds (ref 24–36)

## 2020-09-13 MED ORDER — INSULIN DETEMIR 100 UNIT/ML ~~LOC~~ SOLN
35.0000 [IU] | Freq: Two times a day (BID) | SUBCUTANEOUS | Status: DC
  Administered 2020-09-13 – 2020-09-15 (×4): 35 [IU] via SUBCUTANEOUS
  Filled 2020-09-13 (×7): qty 0.35

## 2020-09-13 MED ORDER — PRISMASOL BGK 4/2.5 32-4-2.5 MEQ/L EC SOLN
Status: DC
  Filled 2020-09-13 (×78): qty 5000

## 2020-09-13 MED ORDER — HEPARIN SODIUM (PORCINE) 1000 UNIT/ML DIALYSIS
1000.0000 [IU] | INTRAMUSCULAR | Status: DC | PRN
  Administered 2020-09-15: 2800 [IU] via INTRAVENOUS_CENTRAL
  Administered 2020-09-16 – 2020-09-21 (×2): 3800 [IU] via INTRAVENOUS_CENTRAL
  Filled 2020-09-13: qty 3
  Filled 2020-09-13: qty 4
  Filled 2020-09-13: qty 3
  Filled 2020-09-13: qty 2
  Filled 2020-09-13 (×5): qty 6

## 2020-09-13 MED ORDER — OXYCODONE HCL 5 MG PO TABS
5.0000 mg | ORAL_TABLET | Freq: Four times a day (QID) | ORAL | Status: DC
  Administered 2020-09-13 – 2020-09-14 (×4): 5 mg
  Filled 2020-09-13 (×4): qty 1

## 2020-09-13 MED ORDER — PRISMASOL BGK 4/2.5 32-4-2.5 MEQ/L REPLACEMENT SOLN
Status: DC
  Filled 2020-09-13 (×21): qty 5000

## 2020-09-13 MED ORDER — HEPARIN (PORCINE) 2000 UNITS/L FOR CRRT: INTRAVENOUS_CENTRAL | Status: DC | PRN

## 2020-09-13 NOTE — Progress Notes (Addendum)
[facility] ROUNDING NOTE   Subjective:   Brief history 69 year old with history of diabetes hypertension hyperlipidemia colon cancer status post partial colectomy colostomy admitted 08/31/2020 with atrial fibrillation rapid ventricular rate Covid pneumonia found to have perforated duodenal ulcer underwent exploratory laparotomy developed hemorrhagic shock respiratory failure.  Creatinine 5.6 mg/dL on admission secondary to atrial fibrillation and hemodynamic instability.  CT scan did not reveal any evidence of hydronephrosis was started on CRRT 09/08/2020 for oliguric renal failure.  Blood pressure 116/50 pulse 83 temperature 97.9 O2 sats  99% FiO2 50%  Kept even on CRRT oliguric/anuric  Sodium 136 potassium 3.8 CO2 27 chloride 104 BUN 27 creatinine 0.87 glucose 199 calcium 8.4 albumin 1.5 WBC 19.5 hemoglobin 7.1 platelets 34     Objective:  Vital signs in last 24 hours:  Temp:  [93.6 F (34.2 C)-98.9 F (37.2 C)] 97.9 F (36.6 C) (11/15 0442) Pulse Rate:  [50-115] 96 (11/15 0545) Resp:  [30-32] 30 (11/15 0337) BP: (89-148)/(45-67) 101/54 (11/15 0545) SpO2:  [91 %-100 %] 98 % (11/15 0545) Arterial Line BP: (110-144)/(42-56) 121/49 (11/15 0545) FiO2 (%):  [40 %-50 %] 50 % (11/15 0337) Weight:  [88.8 kg] 88.8 kg (11/15 0314)  Weight change: 0.5 kg Filed Weights   09/11/20 0213 09/12/20 0257 09/13/20 0314  Weight: 89.8 kg 88.3 kg 88.8 kg    Intake/Output: I/O last 3 completed shifts: In: 3488.5 [I.V.:1113.3; NG/GT:1737.1; IV Piggyback:638.1] Out: 1102 [Urine:45; Drains:110; TRZNB:5670; Stool:425]   Intake/Output this shift:  Total I/O In: 1467.6 [I.V.:330; NG/GT:755; IV Piggyback:382.7] Out: 1539 [Urine:10; Drains:30; LIDCV:0131; Stool:400]  General: Critically ill looking female intubated, sedated  Heart:RRR, s1s2 nl Lungs: Coarse breath sound posteriorly Abdomen:soft,  Extremities:No edema Dialysis Access: Right IJ temporary HD catheter placed by ICU  on 11/10.   Basic Metabolic Panel: Recent Labs  Lab 09/09/20 0428 09/09/20 1600 09/10/20 0329 09/10/20 1822 09/11/20 0314 09/11/20 0314 09/11/20 1646 09/11/20 1646 09/12/20 0252 09/12/20 1706 09/12/20 1916 09/13/20 0306  NA 136  136   < > 136   < > 134*  135   < > 136  --  136  136 135 134* 136  K 4.1  4.0   < > 4.4   < > 4.0  4.0   < > 4.4  --  3.9  3.9 4.5 4.4 3.8  CL 105  106   < > 104   < > 103  104  --  106  --  104  105 103  --  104  CO2 22  21*   < > 25   < > 24  25  --  25  --  _0 --  27  GLUCOSE 233*  231*   < > 184*   < > 207*  206*  --  170*  --  147*  148* 177*  --  199*  BUN 52*  52*   < > 35*   < > 31*  31*  --  27*  --  26*  26* 24*  --  27*  CREATININE 1.65*  1.60*   < > 1.19*   < > 1.10*  1.05*  --  0.93  --  0.90  0.90 0.87  --  0.87  CALCIUM 7.4*  7.4*   < > 8.1*   < > 8.2*  8.2*   < > 8.4*   < > 8.7*  8.7* 8.6*  --  8.4*  MG 2.2  --  2.4  --  2.5*  --   --   --  2.4  --   --  2.3  PHOS 4.6  4.4   < > 4.1  --  3.0  --  3.0  --  2.2* 3.5  --  2.7   < > = values in this interval not displayed.    Liver Function Tests: Recent Labs  Lab 09/09/20 0428 09/09/20 1600 09/10/20 1822 09/10/20 1822 09/11/20 0314 09/11/20 1646 09/12/20 0252 09/12/20 1706 09/13/20 0306  AST 6,033*  --  1,481*  --  1,002*  --  448*  --  232*  ALT 2,573*  --  2,693*  --  2,400*  --  1,542*  --  964*  ALKPHOS 105  --  173*  --  236*  --  238*  --  237*  BILITOT 1.2  --  1.2  --  1.0  --  0.9  --  1.2  PROT 3.9*  --  4.8*  --  5.0*  --  4.9*  --  4.8*  ALBUMIN 1.8*  1.8*   < > 1.9*   < > 1.9*  1.8* 1.7* 1.7*  1.7* 1.6* 1.5*   < > = values in this interval not displayed.   No results for input(s): LIPASE, AMYLASE in the last 168 hours. No results for input(s): AMMONIA in the last 168 hours.  CBC: Recent Labs  Lab 09/08/20 0446 09/08/20 1007 09/09/20 0428 09/09/20 0755 09/11/20 1004 09/12/20 0252 09/12/20 1916 09/13/20 0306  WBC  28.7*  --  23.7*  --  28.7* 27.9*  --  19.5*  NEUTROABS  --   --   --   --   --   --   --  16.9*  HGB 7.8*   < > 7.5*  --  8.1* 8.0* 7.5* 7.1*  HCT 24.1*   < > 22.3*  --  24.9* 24.4* 22.0* 21.9*  MCV 81.1  --  78.0*  --  78.1* 79.2*  --  79.9*  PLT 168  --  65* 68* 42* 39*  --  34*   < > = values in this interval not displayed.    Cardiac Enzymes: No results for input(s): CKTOTAL, CKMB, CKMBINDEX, TROPONINI in the last 168 hours.  BNP: Invalid input(s): POCBNP  CBG: Recent Labs  Lab 09/12/20 1148 09/12/20 1610 09/12/20 1946 09/12/20 2313 09/13/20 0313  GLUCAP 162* 136* 188* 148* 32*    Microbiology: Results for orders placed or performed during the hospital encounter of 08/31/20  MRSA PCR Screening     Status: None   Collection Time: 09/08/20  8:17 AM   Specimen: Nasal Mucosa; Nasopharyngeal  Result Value Ref Range Status   MRSA by PCR NEGATIVE NEGATIVE Final    Comment:        The GeneXpert MRSA Assay (FDA approved for NASAL specimens only), is one component of a comprehensive MRSA colonization surveillance program. It is not intended to diagnose MRSA infection nor to guide or monitor treatment for MRSA infections. Performed at [facility], [place] [address], [place], [place] [address]   Culture, blood (Routine X 2) w Reflex to ID Panel     Status: None (Preliminary result)   Collection Time: 09/08/20  8:35 AM   Specimen: BLOOD LEFT HAND  Result Value Ref Range Status   Specimen Description BLOOD LEFT HAND  Final   Special Requests   Final    BOTTLES DRAWN AEROBIC ONLY Blood Culture  results may not be optimal due to an inadequate volume of blood received in culture bottles   Culture   Final    NO GROWTH 4 DAYS Performed at [facility], [place] [address] [place], [place] [address]    Report Status PENDING  Incomplete  Culture, blood (Routine X 2) w Reflex to ID Panel     Status: None (Preliminary result)   Collection Time: 09/08/20  8:35 AM    Specimen: BLOOD RIGHT HAND  Result Value Ref Range Status   Specimen Description BLOOD RIGHT HAND  Final   Special Requests   Final    BOTTLES DRAWN AEROBIC ONLY Blood Culture results may not be optimal due to an inadequate volume of blood received in culture bottles   Culture   Final    NO GROWTH 4 DAYS Performed at [facility], [place] [address], [place], [place] [address]    Report Status PENDING  Incomplete    Coagulation Studies: Recent Labs    09/13/20 0438  LABPROT 13.0  INR 1.0    Urinalysis: No results for input(s): COLORURINE, LABSPEC, PHURINE, GLUCOSEU, HGBUR, BILIRUBINUR, KETONESUR, PROTEINUR, UROBILINOGEN, NITRITE, LEUKOCYTESUR in the last 72 hours.  Invalid input(s): APPERANCEUR    Imaging: DG CHEST PORT 1 VIEW  Result Date: 09/11/2020 CLINICAL DATA:  COVID-19 positive, intubated EXAM: PORTABLE CHEST 1 VIEW COMPARISON:  09/11/2020 at 7:50 a.m. FINDINGS: Single frontal view of the chest demonstrates stable endotracheal tube, enteric catheter, right internal jugular catheter, left subclavian catheter, and right-sided PICC. Cardiac silhouette is stable. Multifocal basilar predominant airspace disease greatest at the left lung base, stable. Small right pleural effusion is suspected. No pneumothorax. IMPRESSION: 1. Stable support devices. 2. Bibasilar multifocal pneumonia consistent with COVID 19. 3. Small right pleural effusion. Electronically Signed   By: Randa Ngo M.D.   On: 09/11/2020 17:15   DG CHEST PORT 1 VIEW  Result Date: 09/11/2020 CLINICAL DATA:  Hypoxia EXAM: PORTABLE CHEST 1 VIEW COMPARISON:  September 09, 2020 FINDINGS: Endotracheal tube tip is 3.8 cm above the carina. Central catheter tips are in the superior vena cava, unchanged. Nasogastric tube no longer evident. No appreciable pneumothorax. Areas of airspace opacity are noted in both lower lung regions and right mid lung, essentially stable. No new opacities are evident. Heart is upper normal in  size with pulmonary vascularity normal. No adenopathy. No bone lesions. IMPRESSION: Tube and catheter positions as described without pneumothorax. Multifocal airspace opacity, likely due to multifocal pneumonia, stable. A degree of superimposed pulmonary edema cannot be excluded. Stable cardiac silhouette. Electronically Signed   By: Lowella Grip III M.D.   On: 09/11/2020 09:45   VAS [place] LOWER EXTREMITY VENOUS (DVT)  Result Date: 09/12/2020  Lower Venous DVT Study Indications: Covie-19, elevated D-Dimer.  Limitations: Body habitus and edema. Comparison Study: Prior negative study done 08/24/20 Performing Technologist: Sharion Dove RVS  Examination Guidelines: A complete evaluation includes B-mode imaging, spectral Doppler, color Doppler, and power Doppler as needed of all accessible portions of each vessel. Bilateral testing is considered an integral part of a complete examination. Limited examinations for reoccurring indications may be performed as noted. The reflux portion of the exam is performed with the patient in reverse Trendelenburg.  +---------+---------------+---------+-----------+----------+-----------------+ RIGHT    CompressibilityPhasicitySpontaneityPropertiesThrombus Aging    +---------+---------------+---------+-----------+----------+-----------------+ CFV      Full           Yes      Yes                                    +---------+---------------+---------+-----------+----------+-----------------+  SFJ      Full                                                           +---------+---------------+---------+-----------+----------+-----------------+ FV Prox  Full                                                           +---------+---------------+---------+-----------+----------+-----------------+ FV Mid   Full                                                           +---------+---------------+---------+-----------+----------+-----------------+ FV  DistalFull                                                           +---------+---------------+---------+-----------+----------+-----------------+ PFV      Full                                                           +---------+---------------+---------+-----------+----------+-----------------+ POP      Full                                                           +---------+---------------+---------+-----------+----------+-----------------+ PTV      None                                         Age Indeterminate +---------+---------------+---------+-----------+----------+-----------------+ PERO     None                                         Age Indeterminate +---------+---------------+---------+-----------+----------+-----------------+   +---------+---------------+---------+-----------+----------+-------------------+ LEFT     CompressibilityPhasicitySpontaneityPropertiesThrombus Aging      +---------+---------------+---------+-----------+----------+-------------------+ CFV      Full           Yes      Yes                                      +---------+---------------+---------+-----------+----------+-------------------+ SFJ      Full                                                             +---------+---------------+---------+-----------+----------+-------------------+  FV Prox  Full                                                             +---------+---------------+---------+-----------+----------+-------------------+ FV Mid   Full                                                             +---------+---------------+---------+-----------+----------+-------------------+ FV Distal               Yes      Yes                  patent by color and                                                       Doppler             +---------+---------------+---------+-----------+----------+-------------------+ PFV      Full                                                              +---------+---------------+---------+-----------+----------+-------------------+ POP      Full           Yes      Yes                                      +---------+---------------+---------+-----------+----------+-------------------+ PTV      None                                         Age Indeterminate   +---------+---------------+---------+-----------+----------+-------------------+ PERO                                                  Not well visualized +---------+---------------+---------+-----------+----------+-------------------+     Summary: RIGHT: - 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  *See table(s) above for measurements and observations.    Preliminary      Medications:   . ceFEPime (MAXIPIME) IV Stopped (09/12/20 2233)  . fentaNYL infusion INTRAVENOUS 200 mcg/hr (09/13/20 0600)  . midazolam 1 mg/hr (09/13/20 0600)  . norepinephrine (LEVOPHED) Adult infusion 2 mcg/min (09/13/20 0600)  . prismasol BGK 2/2.5 replacement solution 500 mL/hr at 09/13/20 0314  . prismasol BGK 2/2.5 replacement solution 300 mL/hr at 09/13/20 0445  .  prismasol BGK 4/2.5 1,800 mL/hr at 09/13/20 0647  . vasopressin 0.03 Units/min (09/13/20 0600)   . artificial tears  1 application Both Eyes T7D  . atropine      . B-complex with vitamin C  1 tablet Per Tube Daily  . bisacodyl  10 mg Rectal Once  . chlorhexidine gluconate (MEDLINE KIT)  15 mL Mouth Rinse BID  . Chlorhexidine Gluconate Cloth  6 each Topical Daily  . docusate  100 mg Per Tube BID  . feeding supplement (PIVOT 1.5 CAL)  1,000 mL Per Tube Q24H  . hydrocortisone sodium succinate  50 mg Intravenous Daily  . insulin aspart  0-20 Units Subcutaneous Q4H  . insulin  aspart  10 Units Subcutaneous Q4H  . insulin detemir  30 Units Subcutaneous BID  . mouth rinse  15 mL Mouth Rinse 10 times per day  . pantoprazole sodium  40 mg Per Tube BID  . polyethylene glycol  17 g Per Tube Daily   acetaminophen (TYLENOL) oral liquid 160 mg/5 mL, albuterol, diphenhydrAMINE, fentaNYL, heparin, midazolam, phenol, silver nitrate applicators, sodium chloride, vecuronium  Assessment/ Plan:  1.Acute kidney injury, oliguric likely ATN due to hemorrhagic shock: Patient initially had AKI with creatinine level of 5.6 on admission due to A. fib with RVR/hemodynamic instability which was improved. However she developed another episode of AKI after shock.  Urinalysis was contaminated/UTI. CT scan A/P without hydronephrosis. Started CRRT on 11/10 because of oliguria and hyperkalemia. Tolerating well.  Kept even.  Will change to 4/2.5 bags  2.Hyperkalemia due to AKI and intra-abdominal bleeding/PRBC transfusion: Now managed with CRRT.  3.Hypophosphatemia: Replete phosphate.  4.Hemorrhagic shock: Received blood transfusion.  Currently on Levophed and vasopressin.Maintain MAP >65.   5.Perforated duodenal ulcer status post ex lap: General surgery is following. Currently on tube feed.  6.ARDS/recent Covid pneumonia: Currently on mechanical ventilation, prone position. Per primary team.   LOS: [place] _0 _1 :50 AM

## 2020-09-13 NOTE — Progress Notes (Signed)
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  Difficult situation. Patient had been on Eliquis which was stopped d/t bleeding and hemorrhagic shock. Platelet count = 34. Reluctant to restart anticoagulation given life threatening bleeding episode.   eICU Interventions  Plan: 1. PT/INR and PTT now. 2. Defer further management to day rounding team.     Intervention Category Major Interventions: Other:  Lysle Dingwall 09/13/2020, 4:36 AM

## 2020-09-13 NOTE — Progress Notes (Signed)
[name], MRN:  [id number], DOB:  [birth year], LOS: 12 ADMISSION DATE:  08/31/2020, CONSULTATION DATE:  09/07/2020 REFERRING MD:  Dr Candace [name], CHIEF COMPLAINT:  Acute resp failure  Brief History   69 year old female who was previously diagnosed with Covid 08/23/2020.  Admitted 11/2 with AF-RVR, found to have a perforated duodenal underwent exploratory laparotomy with Phillip Heal patch placement.  History of present illness   69 year old black female that presented to the emergency room on 08/31/2020 for worsening of hypoxia and atrial fibrillation with RVR.  She was admitted to medical floor and placed on Cardizem infusion as well as Eliquis.  She then developed abdominal pain and was found to have a perforated duodenal ulcer for which she went to the operating room and had exploratory laparotomy, lysis of adhesions, Graham patch placement.  Onpostop day 6,developed bleeding from the surgical site.  Reported to a saturated dressing.  Patient was noted to have a hemoglobin change from 9-6.9.  During the same period time she had waxing waning blood pressure.  Developed hemorrhagic shock requiring pressors, blood transfusion and intubation  Past Medical History  Covid pneumonia Atrial fibrillation CKD stage III Diabetes mellitus Hypertension Colon cancer Hyperlipidemia  Significant Hospital Events   11/2 Admitted  11/3 OR -> perforated duodenal ulcer 10/10 progressive hemorrhagic shock, intubated, transfused, pressors, proned; started on CRRT in PM  Consults:  Cardiology [facility] Nephrology   Procedures:  11/3 Exploratory laparotomy, Phillip Heal patch, lysis of adhesion for duodenal ulceration postop day 6  R PICC 11/5 >> A line 11/9 >> ETT 11/9 >> Lt [place] CVL 11/9 >> R IJ trialysis >>   Significant Diagnostic Tests:  11/3 CT abd/ pelvis >> 1. Positive for bowel perforation: Pneumoperitoneum and intermediate density free fluid in the abdomen. Prior total colectomy. The specific site of  perforation is unclear-oral contrast present to the proximal jejunum has not obviously leaked. Note that there may be small bowel loops adherent to the ventral abdominal wall along the greater curve of the stomach. 2. Extensive bilateral lower lung pneumonia. No pleural effusion. 3. Other abdominal and pelvic viscera are stable since 2015, including bilateral adrenal adenomas. Chronic renal parapelvic cysts. 4. Aortic Atherosclerosis  11/3 TTE >> EF 70-75%, RV not well visualized, mildly reduced RV systolic function  28/41 CT chest/ abd/ pelvis>> 1. Interval progression of diffuse bilateral hazy ground-glass airspace opacities with more focal areas of consolidation at the lung bases 2. Trace bilateral pleural effusions. 3. Postsurgical changes the abdomen as detailed above. No evidence for a postoperative abscess, however evaluation is limited by lack of IV contrast. 4. There is a 1.9 cm cystic appearing lesion located in the pancreatic body. This was not present on the patient's CT from 2015.  Follow-up with an outpatient contrast enhanced MRI is recommended. 5. The endometrial stripe appears diffusely thickened. Follow-up with pelvic ultrasound is recommended. Aortic Atherosclerosis  11/10 LE doppler studies >>  Micro Data:  11/10 MRSA PCR >> neg 10/10 BC x 2 >>  10/10 trach asp >>  Antimicrobials:  azithro 11/2 >>11/3 Ceftriaxone 11/2  Fluconazole 11/3 Zosyn 11/3 >> 11/7 Vanc 11/10 Cefepime 11/10 >>  Subjective:   Patient remains critically ill, unproned. On cvvhd and pressors   Objective   Blood pressure (!) 100/57, pulse 82, temperature (!) 95.9 F (35.5 C), temperature source Oral, resp. rate (!) 30, height 5\' 8"  (1.727 m), weight 88.8 kg, SpO2 100 %. CVP:  [2 mmHg] 2 mmHg  Vent Mode: PRVC FiO2 (%):  [  40 %-50 %] 50 % Set Rate:  [30 bmp] 30 bmp Vt Set:  [400 mL] 400 mL PEEP:  [10 EYC14-48 cmH20] 10 cmH20 Plateau Pressure:  [24 cmH20-28 cmH20] 24 cmH20    Intake/Output Summary (Last 24 hours) at 09/13/2020 0711 Last data filed at 09/13/2020 0700 Gross per 24 hour  Intake 2696.83 ml  Output 3068 ml  Net -371.17 ml   Filed Weights   09/11/20 0213 09/12/20 0257 09/13/20 0314  Weight: 89.8 kg 88.3 kg 88.8 kg    Examination: General: fm, intubated on life support  HEENT: NCAT, sclera clear, soft facial swelling  Neuro: sedated, on vent CV: RRR, s1 s2  PULM: BL mech vented breaths  GI: soft, nd Extremities: no significant edema  Skin: no rash    Assessment & Plan:   Shock, multifactorial,  hemorrhagic and septic Plan: - Weaning pressors as tolerated  - continue NEPI and Vasopressin  - follow hgb, transfus for hgb <7  - weaned from stress dose steroids  - continue cefepime, complete 7 days   ARDS COVID pneumonia,  possible secondary bacterial HCAP  Continue mechanical ventilation per ARDS protocol Target TVol 6-8cc/kgIBW Target Plateau Pressure < 30cm H20 Target driving pressure less than 15 cm of water Target PaO2 55-65: titrate PEEP/FiO2 per protocol Check CVP daily if CVL in place Target CVP less than 4, diurese as necessary Ventilator associated pneumonia prevention protocol No plans to reprone at this time  Scheduled oral oxy to help come of fent infusion   Contact precautions -Patient has reached 21-day window however due to critical illness and progressive ARDS multiorgan failure state after discussion with infectious disease recommending continuation of contact precautions.  Acute blood loss anemia -CT abdomen stable - post-op care per surgery   Bilateral LE DVT - unable to start [facility] due to thrombocytopenia   Atrial fibrillation/RVR  - holding ac due to thrombocytopenia   AKI - oliguric likely ATN w/ hyperkalemia - on cvvhd per nephrology   Duodenal perforation/ pneumoperitoneum   - S/pexploratory laparotomy, adhesiolysis, modified graham patch repair of perforated duodenal ulcer11/3; UGI neg for  leak 11/8 -Follow JP output -Postop care per general surgery, we appreciate their input.  Acute liver injury Transaminitis, AST 6033 / ALT 2573    (previously 59 / 56 on 11/9) - ? 2/2 TPN vs shock liver  -Suspect related to shock liver Plan: Continue to follow  Seems to be improving   Thrombocytopenia - DIC panel neg HIT panel negative - continuing to observe  - multifactorial etiology   1.9 cm cystic appearing lesion located in the pancreatic body - found on CT 11/11 - outpatient follow up   Best practice:  Diet: Tube feeds Pain/Anxiety/Delirium protocol (if indicated): Coming off continuous sedation VAP protocol (if indicated): yes DVT prophylaxis: SCDs GI prophylaxis: Protonix BID Glucose control: Insulin gtt transitioned to SSI/ levemir  Mobility: Bedrest Code Status: Full Family Communication: I called and spoke with the patients daughter.  Disposition: ICU  Labs   CBC: Recent Labs  Lab 09/08/20 0446 09/08/20 1007 09/09/20 0428 09/09/20 0755 09/11/20 1004 09/12/20 0252 09/12/20 1916 09/13/20 0306  WBC 28.7*  --  23.7*  --  28.7* 27.9*  --  19.5*  NEUTROABS  --   --   --   --   --   --   --  16.9*  HGB 7.8*   < > 7.5*  --  8.1* 8.0* 7.5* 7.1*  HCT 24.1*   < > 22.3*  --  24.9* 24.4* 22.0* 21.9*  MCV 81.1  --  78.0*  --  78.1* 79.2*  --  79.9*  PLT 168  --  65* 68* 42* 39*  --  34*   < > = values in this interval not displayed.    Basic Metabolic Panel: Recent Labs  Lab 09/09/20 0428 09/09/20 1600 09/10/20 0329 09/10/20 1822 09/11/20 0314 09/11/20 0314 09/11/20 1646 09/12/20 0252 09/12/20 1706 09/12/20 1916 09/13/20 0306  NA 136  136   < > 136   < > 134*  135   < > 136 136  136 135 134* 136  K 4.1  4.0   < > 4.4   < > 4.0  4.0   < > 4.4 3.9  3.9 4.5 4.4 3.8  CL 105  106   < > 104   < > 103  104  --  106 104  105 103  --  104  CO2 22  21*   < > 25   < > 24  25  --  25 26  26 26   --  27  GLUCOSE 233*  231*   < > 184*   < > 207*   206*  --  170* 147*  148* 177*  --  199*  BUN 52*  52*   < > 35*   < > 31*  31*  --  27* 26*  26* 24*  --  27*  CREATININE 1.65*  1.60*   < > 1.19*   < > 1.10*  1.05*  --  0.93 0.90  0.90 0.87  --  0.87  CALCIUM 7.4*  7.4*   < > 8.1*   < > 8.2*  8.2*  --  8.4* 8.7*  8.7* 8.6*  --  8.4*  MG 2.2  --  2.4  --  2.5*  --   --  2.4  --   --  2.3  PHOS 4.6  4.4   < > 4.1  --  3.0  --  3.0 2.2* 3.5  --  2.7   < > = values in this interval not displayed.   GFR: Estimated Creatinine Clearance: 71.2 mL/min (by C-G formula based on SCr of 0.87 mg/dL). Recent Labs  Lab 09/07/20 2202 09/07/20 2232 09/08/20 0446 09/08/20 0446 09/08/20 1212 09/08/20 2218 09/09/20 0428 09/10/20 0329 09/11/20 0314 09/11/20 1004 09/12/20 0252 09/13/20 0306  PROCALCITON   < >  --  7.57   < >  --   --  12.02 10.16 5.90  --  5.29  --   WBC   < >  --  28.7*   < >  --   --  23.7*  --   --  28.7* 27.9* 19.5*  LATICACIDVEN  --  9.9* 9.4*  --  4.1* 2.5*  --   --   --   --   --   --    < > = values in this interval not displayed.    Liver Function Tests: Recent Labs  Lab 09/09/20 0428 09/09/20 1600 09/10/20 1822 09/10/20 1822 09/11/20 0314 09/11/20 1646 09/12/20 0252 09/12/20 1706 09/13/20 0306  AST 6,033*  --  1,481*  --  1,002*  --  448*  --  232*  ALT 2,573*  --  2,693*  --  2,400*  --  1,542*  --  964*  ALKPHOS 105  --  173*  --  236*  --  238*  --  237*  BILITOT 1.2  --  1.2  --  1.0  --  0.9  --  1.2  PROT 3.9*  --  4.8*  --  5.0*  --  4.9*  --  4.8*  ALBUMIN 1.8*  1.8*   < > 1.9*   < > 1.9*  1.8* 1.7* 1.7*  1.7* 1.6* 1.5*   < > = values in this interval not displayed.   No results for input(s): LIPASE, AMYLASE in the last 168 hours. No results for input(s): AMMONIA in the last 168 hours.  ABG    Component Value Date/Time   PHART 7.424 09/12/2020 1916   PCO2ART 42.2 09/12/2020 1916   PO2ART 91 09/12/2020 1916   HCO3 27.9 09/12/2020 1916   TCO2 29 09/12/2020 1916   ACIDBASEDEF 0.2  09/11/2020 1725   O2SAT 97.0 09/12/2020 1916     This patient is critically ill with multiple organ system failure; which, requires frequent high complexity decision making, assessment, support, evaluation, and titration of therapies. This was completed through the application of advanced monitoring technologies and extensive interpretation of multiple databases. During this encounter critical care time was devoted to patient care services described in this note for 33 minutes.  Garner Nash, DO [facility] 09/13/2020 7:11 AM

## 2020-09-13 NOTE — Progress Notes (Signed)
OT Cancellation Note  Patient Details Name: Candace [name] MRN: [id number] DOB: 1950/11/10   Cancelled Treatment:    Reason Eval/Treat Not Completed: Patient not medically ready. Pt sedated/Intubated and not medically appropriate at this time. OT signing off. Please reorder once medically ready. Thanks.  Ramond Dial, OT/L   Acute OT Clinical Specialist Acute Rehabilitation Services Pager [phone] Office [phone]  09/13/2020, 8:11 AM

## 2020-09-13 NOTE — Progress Notes (Signed)
Daughter, [name] was updated on current patient status and plan of care. All questions were answered at this time.

## 2020-09-13 NOTE — Progress Notes (Signed)
Assisted family with tele-visit via elink 

## 2020-09-13 NOTE — Progress Notes (Signed)
PCCM:  I called and spoke with the patients daughter.   Garner Nash, DO [facility] 09/13/2020 10:52 AM

## 2020-09-13 NOTE — Progress Notes (Signed)
Daughter, [name] was updated again this afternoon.  She also picked up all of the patient's belongings including the patient's purse with her wallet, cellphone, and charger in it. No cash was found in the wallet, just her credit cards and blank checks.

## 2020-09-13 NOTE — Progress Notes (Signed)
Spoke with and updated pts daughter, [name].

## 2020-09-14 ENCOUNTER — Inpatient Hospital Stay (HOSPITAL_COMMUNITY): Payer: Medicare Other

## 2020-09-14 LAB — BLOOD GAS, ARTERIAL
Acid-Base Excess: 1.2 mmol/L (ref 0.0–2.0)
Bicarbonate: 25.7 mmol/L (ref 20.0–28.0)
Drawn by: 60087
FIO2: 100
O2 Saturation: 78.4 %
Patient temperature: 37
pCO2 arterial: 43.9 mmHg (ref 32.0–48.0)
pH, Arterial: 7.385 (ref 7.350–7.450)
pO2, Arterial: 42.8 mmHg — ABNORMAL LOW (ref 83.0–108.0)

## 2020-09-14 LAB — CBC
HCT: 21.1 % — ABNORMAL LOW (ref 36.0–46.0)
HCT: 29.3 % — ABNORMAL LOW (ref 36.0–46.0)
Hemoglobin: 6.7 g/dL — CL (ref 12.0–15.0)
Hemoglobin: 9.4 g/dL — ABNORMAL LOW (ref 12.0–15.0)
MCH: 26 pg (ref 26.0–34.0)
MCH: 25.3 pg — ABNORMAL LOW (ref 26.0–34.0)
MCHC: 31.8 g/dL (ref 30.0–36.0)
MCHC: 32.1 g/dL (ref 30.0–36.0)
MCV: 81.8 fL (ref 80.0–100.0)
MCV: 79 fL — ABNORMAL LOW (ref 80.0–100.0)
Platelets: 35 10*3/uL — ABNORMAL LOW (ref 150–400)
Platelets: 32 10*3/uL — ABNORMAL LOW (ref 150–400)
RBC: 2.58 MIL/uL — ABNORMAL LOW (ref 3.87–5.11)
RBC: 3.71 MIL/uL — ABNORMAL LOW (ref 3.87–5.11)
RDW: 19.2 % — ABNORMAL HIGH (ref 11.5–15.5)
RDW: 18 % — ABNORMAL HIGH (ref 11.5–15.5)
WBC: 11.7 10*3/uL — ABNORMAL HIGH (ref 4.0–10.5)
WBC: 5.5 10*3/uL (ref 4.0–10.5)
nRBC: 9.2 % — ABNORMAL HIGH (ref 0.0–0.2)
nRBC: 13.4 % — ABNORMAL HIGH (ref 0.0–0.2)

## 2020-09-14 LAB — COMPREHENSIVE METABOLIC PANEL
ALT: 616 U/L — ABNORMAL HIGH (ref 0–44)
AST: 133 U/L — ABNORMAL HIGH (ref 15–41)
Albumin: 1.5 g/dL — ABNORMAL LOW (ref 3.5–5.0)
Alkaline Phosphatase: 240 U/L — ABNORMAL HIGH (ref 38–126)
Anion gap: 5 (ref 5–15)
BUN: 30 mg/dL — ABNORMAL HIGH (ref 8–23)
CO2: 27 mmol/L (ref 22–32)
Calcium: 8.3 mg/dL — ABNORMAL LOW (ref 8.9–10.3)
Chloride: 106 mmol/L (ref 98–111)
Creatinine, Ser: 0.76 mg/dL (ref 0.44–1.00)
GFR, Estimated: >60 mL/min (ref >60)
Glucose, Bld: 129 mg/dL — ABNORMAL HIGH (ref 70–99)
Potassium: 4.2 mmol/L (ref 3.5–5.1)
Sodium: 138 mmol/L (ref 135–145)
Total Bilirubin: 0.5 mg/dL (ref 0.3–1.2)
Total Protein: 4.8 g/dL — ABNORMAL LOW (ref 6.5–8.1)

## 2020-09-14 LAB — GLUCOSE, CAPILLARY
Glucose-Capillary: 103 mg/dL — ABNORMAL HIGH (ref 70–99)
Glucose-Capillary: 106 mg/dL — ABNORMAL HIGH (ref 70–99)
Glucose-Capillary: 123 mg/dL — ABNORMAL HIGH (ref 70–99)
Glucose-Capillary: 129 mg/dL — ABNORMAL HIGH (ref 70–99)
Glucose-Capillary: 153 mg/dL — ABNORMAL HIGH (ref 70–99)
Glucose-Capillary: 192 mg/dL — ABNORMAL HIGH (ref 70–99)
Glucose-Capillary: 223 mg/dL — ABNORMAL HIGH (ref 70–99)
Glucose-Capillary: 225 mg/dL — ABNORMAL HIGH (ref 70–99)
Glucose-Capillary: 65 mg/dL — ABNORMAL LOW (ref 70–99)

## 2020-09-14 LAB — RENAL FUNCTION PANEL
Albumin: 1.7 g/dL — ABNORMAL LOW (ref 3.5–5.0)
Anion gap: 7 (ref 5–15)
BUN: 25 mg/dL — ABNORMAL HIGH (ref 8–23)
CO2: 26 mmol/L (ref 22–32)
Calcium: 8.6 mg/dL — ABNORMAL LOW (ref 8.9–10.3)
Chloride: 104 mmol/L (ref 98–111)
Creatinine, Ser: 0.61 mg/dL (ref 0.44–1.00)
GFR, Estimated: >60 mL/min (ref >60)
Glucose, Bld: 102 mg/dL — ABNORMAL HIGH (ref 70–99)
Phosphorus: 1.6 mg/dL — ABNORMAL LOW (ref 2.5–4.6)
Potassium: 4.3 mmol/L (ref 3.5–5.1)
Sodium: 137 mmol/L (ref 135–145)

## 2020-09-14 LAB — POCT I-STAT 7, (LYTES, BLD GAS, ICA,H+H)
Acid-Base Excess: 1 mmol/L (ref 0.0–2.0)
Acid-Base Excess: 3 mmol/L — ABNORMAL HIGH (ref 0.0–2.0)
Bicarbonate: 26 mmol/L (ref 20.0–28.0)
Bicarbonate: 27.7 mmol/L (ref 20.0–28.0)
Calcium, Ion: 1.2 mmol/L (ref 1.15–1.40)
Calcium, Ion: 1.27 mmol/L (ref 1.15–1.40)
HCT: 23 % — ABNORMAL LOW (ref 36.0–46.0)
HCT: 24 % — ABNORMAL LOW (ref 36.0–46.0)
Hemoglobin: 7.8 g/dL — ABNORMAL LOW (ref 12.0–15.0)
Hemoglobin: 8.2 g/dL — ABNORMAL LOW (ref 12.0–15.0)
O2 Saturation: 95 %
O2 Saturation: 93 %
Patient temperature: 96.9
Patient temperature: 96.6
Potassium: 4 mmol/L (ref 3.5–5.1)
Potassium: 4.4 mmol/L (ref 3.5–5.1)
Sodium: 139 mmol/L (ref 135–145)
Sodium: 137 mmol/L (ref 135–145)
TCO2: 27 mmol/L (ref 22–32)
TCO2: 29 mmol/L (ref 22–32)
pCO2 arterial: 40.4 mmHg (ref 32.0–48.0)
pCO2 arterial: 42.6 mmHg (ref 32.0–48.0)
pH, Arterial: 7.412 (ref 7.350–7.450)
pH, Arterial: 7.416 (ref 7.350–7.450)
pO2, Arterial: 70 mmHg — ABNORMAL LOW (ref 83.0–108.0)
pO2, Arterial: 64 mmHg — ABNORMAL LOW (ref 83.0–108.0)

## 2020-09-14 LAB — PREPARE RBC (CROSSMATCH)

## 2020-09-14 LAB — MAGNESIUM: Magnesium: 2.3 mg/dL (ref 1.7–2.4)

## 2020-09-14 LAB — PHOSPHORUS: Phosphorus: 2 mg/dL — ABNORMAL LOW (ref 2.5–4.6)

## 2020-09-14 MED ORDER — OXYCODONE HCL 5 MG PO TABS
10.0000 mg | ORAL_TABLET | Freq: Four times a day (QID) | ORAL | Status: DC
  Administered 2020-09-14 – 2020-10-03 (×77): 10 mg
  Filled 2020-09-14 (×76): qty 2

## 2020-09-14 MED ORDER — DEXTROSE 50 % IV SOLN
INTRAVENOUS | Status: AC
  Administered 2020-09-15: 25 mL
  Filled 2020-09-14: qty 50

## 2020-09-14 MED ORDER — OXYCODONE HCL 5 MG PO TABS
10.0000 mg | ORAL_TABLET | Freq: Four times a day (QID) | ORAL | Status: DC
  Filled 2020-09-14: qty 2

## 2020-09-14 MED ORDER — MIDAZOLAM 50MG/50ML (1MG/ML) PREMIX INFUSION
0.5000 mg/h | INTRAVENOUS | Status: DC
  Administered 2020-09-14: 1 mg/h via INTRAVENOUS
  Administered 2020-09-15: 7 mg/h via INTRAVENOUS
  Administered 2020-09-15: 3 mg/h via INTRAVENOUS
  Administered 2020-09-16 (×2): 5 mg/h via INTRAVENOUS
  Administered 2020-09-17: 2 mg/h via INTRAVENOUS
  Administered 2020-09-18: 3 mg/h via INTRAVENOUS
  Administered 2020-09-19: 2 mg/h via INTRAVENOUS
  Filled 2020-09-14 (×8): qty 50

## 2020-09-14 MED ORDER — SODIUM CHLORIDE 0.9% IV SOLUTION: Freq: Once | INTRAVENOUS | Status: AC

## 2020-09-14 MED ORDER — FENTANYL 2500MCG IN NS 250ML (10MCG/ML) PREMIX INFUSION
0.0000 ug/h | INTRAVENOUS | Status: DC
  Administered 2020-09-14: 50 ug/h via INTRAVENOUS
  Administered 2020-09-15: 350 ug/h via INTRAVENOUS
  Administered 2020-09-15: 400 ug/h via INTRAVENOUS
  Administered 2020-09-16: 225 ug/h via INTRAVENOUS
  Administered 2020-09-16: 400 ug/h via INTRAVENOUS
  Administered 2020-09-16: 250 ug/h via INTRAVENOUS
  Administered 2020-09-17 – 2020-09-18 (×3): 225 ug/h via INTRAVENOUS
  Administered 2020-09-19: 100 ug/h via INTRAVENOUS
  Administered 2020-09-19: 200 ug/h via INTRAVENOUS
  Administered 2020-09-20: 250 ug/h via INTRAVENOUS
  Administered 2020-09-20 – 2020-09-21 (×2): 300 ug/h via INTRAVENOUS
  Administered 2020-09-21: 250 ug/h via INTRAVENOUS
  Administered 2020-09-22 (×3): 300 ug/h via INTRAVENOUS
  Administered 2020-09-23: 150 ug/h via INTRAVENOUS
  Administered 2020-09-23: 300 ug/h via INTRAVENOUS
  Administered 2020-09-24: 200 ug/h via INTRAVENOUS
  Administered 2020-09-25: 50 ug/h via INTRAVENOUS
  Administered 2020-09-26: 150 ug/h via INTRAVENOUS
  Administered 2020-09-26 – 2020-09-27 (×2): 200 ug/h via INTRAVENOUS
  Administered 2020-09-27: 150 ug/h via INTRAVENOUS
  Administered 2020-09-28: 200 ug/h via INTRAVENOUS
  Administered 2020-09-28: 50 ug/h via INTRAVENOUS
  Administered 2020-09-29 – 2020-09-30 (×2): 100 ug/h via INTRAVENOUS
  Administered 2020-10-01: 50 ug/h via INTRAVENOUS
  Filled 2020-09-14 (×29): qty 250

## 2020-09-14 MED ORDER — WHITE PETROLATUM EX OINT
TOPICAL_OINTMENT | CUTANEOUS | Status: AC
  Filled 2020-09-14: qty 28.35

## 2020-09-14 MED ORDER — NOREPINEPHRINE 16 MG/250ML-% IV SOLN
0.0000 ug/min | INTRAVENOUS | Status: DC
  Administered 2020-09-14: 12 ug/min via INTRAVENOUS
  Administered 2020-09-15: 29 ug/min via INTRAVENOUS
  Administered 2020-09-16: 15 ug/min via INTRAVENOUS
  Administered 2020-09-16: 30 ug/min via INTRAVENOUS
  Administered 2020-09-17: 26 ug/min via INTRAVENOUS
  Administered 2020-09-18 – 2020-09-20 (×2): 5 ug/min via INTRAVENOUS
  Administered 2020-09-22: 14 ug/min via INTRAVENOUS
  Administered 2020-09-23: 18 ug/min via INTRAVENOUS
  Administered 2020-09-24: 26 ug/min via INTRAVENOUS
  Administered 2020-09-25: 7 ug/min via INTRAVENOUS
  Administered 2020-09-26: 15 ug/min via INTRAVENOUS
  Administered 2020-09-27: 13 ug/min via INTRAVENOUS
  Administered 2020-09-28 – 2020-09-29 (×2): 20 ug/min via INTRAVENOUS
  Administered 2020-09-29: 26 ug/min via INTRAVENOUS
  Administered 2020-10-01: 34 ug/min via INTRAVENOUS
  Administered 2020-10-02: 16 ug/min via INTRAVENOUS
  Administered 2020-10-03: 12 ug/min via INTRAVENOUS
  Administered 2020-10-04: 14 ug/min via INTRAVENOUS
  Administered 2020-10-04: 24 ug/min via INTRAVENOUS
  Administered 2020-10-05: 40 ug/min via INTRAVENOUS
  Administered 2020-10-05: 25 ug/min via INTRAVENOUS
  Administered 2020-10-06 (×2): 40 ug/min via INTRAVENOUS
  Administered 2020-10-07: 4 ug/min via INTRAVENOUS
  Administered 2020-10-08: 8 ug/min via INTRAVENOUS
  Administered 2020-10-09: 04:00:00 26 ug/min via INTRAVENOUS
  Administered 2020-10-09: 18:00:00 14 ug/min via INTRAVENOUS
  Administered 2020-10-10: 15:00:00 32 ug/min via INTRAVENOUS
  Administered 2020-10-11: 14:00:00 14 ug/min via INTRAVENOUS
  Administered 2020-10-11: 29 ug/min via INTRAVENOUS
  Administered 2020-10-12: 17:00:00 6 ug/min via INTRAVENOUS
  Administered 2020-10-13: 21:00:00 12 ug/min via INTRAVENOUS
  Administered 2020-10-17 – 2020-10-22 (×2): 2 ug/min via INTRAVENOUS
  Administered 2020-10-23: 20:00:00 6 ug/min via INTRAVENOUS
  Administered 2020-10-23: 05:00:00 10 ug/min via INTRAVENOUS
  Administered 2020-10-30: 2 ug/min via INTRAVENOUS
  Administered 2020-10-31: 20 ug/min via INTRAVENOUS
  Administered 2020-11-01: 27 ug/min via INTRAVENOUS
  Administered 2020-11-01: 35 ug/min via INTRAVENOUS
  Administered 2020-11-02: 16 ug/min via INTRAVENOUS
  Administered 2020-11-03: 8 ug/min via INTRAVENOUS
  Administered 2020-11-07: 1 ug/min via INTRAVENOUS
  Administered 2020-11-07: 2 ug/min via INTRAVENOUS
  Filled 2020-09-14 (×19): qty 250
  Filled 2020-09-14: qty 500
  Filled 2020-09-14 (×27): qty 250

## 2020-09-14 MED ORDER — FENTANYL CITRATE (PF) 100 MCG/2ML IJ SOLN
INTRAMUSCULAR | Status: AC
  Filled 2020-09-14: qty 2

## 2020-09-14 MED ORDER — VASOPRESSIN 20 UNITS/100 ML INFUSION FOR SHOCK
0.0000 [IU]/min | INTRAVENOUS | Status: DC
  Administered 2020-09-14 – 2020-09-16 (×5): 0.03 [IU]/min via INTRAVENOUS
  Filled 2020-09-14 (×5): qty 100

## 2020-09-14 NOTE — Progress Notes (Addendum)
Patient was extubated at 1130 am on 10L HFNC. O2 sats 98%. AOx1. No complaints of pain.

## 2020-09-14 NOTE — Progress Notes (Signed)
[facility] Progress Note Patient Name: Candace [name] DOB: [birth year] MRN: [id number]   Date of Service  09/14/2020  HPI/Events of Note  Notified of persistently depressed mental status. Extubated earlier today and now placed on Bipap. CBG 123. Hypoxemic on ABG.  eICU Interventions  Bedside CCM team informed for possible need for reintubation     Intervention Category Major Interventions: Change in mental status - evaluation and management  Judd Lien 09/14/2020, 9:56 PM

## 2020-09-14 NOTE — Progress Notes (Signed)
Progress Note  13 Days Post-Op  Subjective: Patient on the vent. Seen for wound check. Evacuated some old hematoma from wound. Having bowel function from ileostomy and tolerating TF.  Objective: Vital signs in last 24 hours: Temp:  [96.9 F (36.1 C)-98.5 F (36.9 C)] 96.9 F (36.1 C) (11/16 0740) Pulse Rate:  [81-110] 96 (11/16 0800) Resp:  [18-30] 18 (11/16 0740) BP: (111-141)/(56-75) 137/71 (11/16 0800) SpO2:  [88 %-99 %] 99 % (11/16 0800) Arterial Line BP: (122-166)/(41-64) 147/55 (11/16 0800) FiO2 (%):  [40 %-50 %] 40 % (11/16 0800) Weight:  [90.3 kg] 90.3 kg (11/16 0500) Last BM Date: 09/14/20  Intake/Output from previous day: 11/15 0701 - 11/16 0700 In: 2474.5 [I.V.:559.4; NG/GT:1610; IV Piggyback:200.1] Out: 2917 [Urine:35; Drains:150; Stool:525] Intake/Output this shift: Total I/O In: 423.9 [I.V.:28.9; Other:100; NG/GT:195; IV Piggyback:100] Out: 82 [Urine:10; Other:377]  PE: IHK:VQQVZDG on the vent HEENT:OG in place Card: RRR, feet WWP bilaterally Pulm:Mechanically ventilated LOV:FIEP, ND, ileostomy with stool output and some leaking at 2 o'clock (RN has ordered more pouches), midline wound with some dark clot material in base which was debrided some with 4x4 gauze, JP with minimal serosanguinous drainage (25cc/24hr) PIR:JJOACZ soft    Lab Results:  Recent Labs    09/13/20 0306 09/13/20 0306 09/14/20 0520 09/14/20 0851  WBC 19.5*  --  11.7*  --   HGB 7.1*   < > 6.7* 7.8*  HCT 21.9*   < > 21.1* 23.0*  PLT 34*  --  35*  --    < > = values in this interval not displayed.   BMET Recent Labs    09/13/20 1644 09/13/20 1644 09/14/20 0520 09/14/20 0851  NA 134*   < > 138 139  K 4.4   < > 4.2 4.0  CL 101  --  106  --   CO2 25  --  27  --   GLUCOSE 270*  --  129*  --   BUN 30*  --  30*  --   CREATININE 0.75  --  0.76  --   CALCIUM 8.3*  --  8.3*  --    < > = values in this interval not displayed.   PT/INR Recent Labs     09/13/20 0438  LABPROT 13.0  INR 1.0   CMP     Component Value Date/Time   NA 139 09/14/2020 0851   K 4.0 09/14/2020 0851   CL 106 09/14/2020 0520   CO2 27 09/14/2020 0520   GLUCOSE 129 (H) 09/14/2020 0520   BUN 30 (H) 09/14/2020 0520   CREATININE 0.76 09/14/2020 0520   CALCIUM 8.3 (L) 09/14/2020 0520   PROT 4.8 (L) 09/14/2020 0520   ALBUMIN 1.5 (L) 09/14/2020 0520   AST 133 (H) 09/14/2020 0520   ALT 616 (H) 09/14/2020 0520   ALKPHOS 240 (H) 09/14/2020 0520   BILITOT 0.5 09/14/2020 0520   GFRNONAA >60 09/14/2020 0520   Lipase     Component Value Date/Time   LIPASE 48 08/31/2020 2054       Studies/Results: DG CHEST PORT 1 VIEW  Result Date: 09/13/2020 CLINICAL DATA:  Respiratory failure EXAM: PORTABLE CHEST 1 VIEW COMPARISON:  09/11/2020 FINDINGS: Endotracheal tube is seen 4.3 cm above the carina. Nasoenteric feeding tube extends into the upper abdomen beyond the margin of the exam. Right internal jugular temporary dialysis catheter tip seen within the superior vena cava. Right upper extremity PICC line tip seen within the superior vena cava. Left subclavian central venous  catheter has been removed. Asymmetric right mid and bibasilar pulmonary infiltrates appears stable since prior examination. Tiny right pleural effusion is again noted. No pneumothorax. Cardiac size within normal limits. IMPRESSION: Stable support lines and tubes save for interval removal of the left subclavian central venous catheter. Stable asymmetric mid and lower lung zone pulmonary infiltrates. Electronically Signed   By: Fidela Salisbury MD   On: 09/13/2020 07:00   VAS [place] LOWER EXTREMITY VENOUS (DVT)  Result Date: 09/13/2020  Lower Venous DVT Study Indications: Covie-19, elevated D-Dimer.  Limitations: Body habitus and edema. Comparison Study: Prior negative study done 08/24/20 Performing Technologist: Sharion Dove RVS  Examination Guidelines: A complete evaluation includes B-mode imaging, spectral  Doppler, color Doppler, and power Doppler as needed of all accessible portions of each vessel. Bilateral testing is considered an integral part of a complete examination. Limited examinations for reoccurring indications may be performed as noted. The reflux portion of the exam is performed with the patient in reverse Trendelenburg.  +---------+---------------+---------+-----------+----------+-----------------+ RIGHT    CompressibilityPhasicitySpontaneityPropertiesThrombus Aging    +---------+---------------+---------+-----------+----------+-----------------+ CFV      Full           Yes      Yes                                    +---------+---------------+---------+-----------+----------+-----------------+ SFJ      Full                                                           +---------+---------------+---------+-----------+----------+-----------------+ FV Prox  Full                                                           +---------+---------------+---------+-----------+----------+-----------------+ FV Mid   Full                                                           +---------+---------------+---------+-----------+----------+-----------------+ FV DistalFull                                                           +---------+---------------+---------+-----------+----------+-----------------+ PFV      Full                                                           +---------+---------------+---------+-----------+----------+-----------------+ POP      Full                                                           +---------+---------------+---------+-----------+----------+-----------------+  PTV      None                                         Age Indeterminate +---------+---------------+---------+-----------+----------+-----------------+ PERO     None                                         Age Indeterminate  +---------+---------------+---------+-----------+----------+-----------------+   +---------+---------------+---------+-----------+----------+-------------------+ LEFT     CompressibilityPhasicitySpontaneityPropertiesThrombus Aging      +---------+---------------+---------+-----------+----------+-------------------+ CFV      Full           Yes      Yes                                      +---------+---------------+---------+-----------+----------+-------------------+ SFJ      Full                                                             +---------+---------------+---------+-----------+----------+-------------------+ FV Prox  Full                                                             +---------+---------------+---------+-----------+----------+-------------------+ FV Mid   Full                                                             +---------+---------------+---------+-----------+----------+-------------------+ FV Distal               Yes      Yes                  patent by color and                                                       Doppler             +---------+---------------+---------+-----------+----------+-------------------+ PFV      Full                                                             +---------+---------------+---------+-----------+----------+-------------------+ POP      Full           Yes      Yes                                      +---------+---------------+---------+-----------+----------+-------------------+  PTV      None                                         Age Indeterminate   +---------+---------------+---------+-----------+----------+-------------------+ PERO                                                  Not well visualized +---------+---------------+---------+-----------+----------+-------------------+     Summary: RIGHT: - Findings consistent with age indeterminate deep vein thrombosis  involving the right posterior tibial veins, and right peroneal veins.  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  *See table(s) above for measurements and observations. Electronically signed by Servando Snare MD on 09/13/2020 at 4:32:47 PM.    Final     Anti-infectives: Anti-infectives (From admission, onward)   Start     Dose/Rate Route Frequency Ordered Stop   09/12/20 2200  ceFEPIme (MAXIPIME) 2 g in sodium chloride 0.9 % 100 mL IVPB        2 g 200 mL/hr over 30 Minutes Intravenous Every 12 hours 09/12/20 0732 09/15/20 0959   09/11/20 1400  ceFEPIme (MAXIPIME) 2 g in sodium chloride 0.9 % 100 mL IVPB  Status:  Discontinued        2 g 200 mL/hr over 30 Minutes Intravenous Every 8 hours 09/11/20 1126 09/12/20 0732   09/08/20 1000  vancomycin (VANCOREADY) IVPB 2000 mg/400 mL        2,000 mg 200 mL/hr over 120 Minutes Intravenous  Once 09/08/20 0857 09/08/20 1224   09/08/20 1000  ceFEPIme (MAXIPIME) 2 g in sodium chloride 0.9 % 100 mL IVPB  Status:  Discontinued        2 g 200 mL/hr over 30 Minutes Intravenous Every 12 hours 09/08/20 0857 09/11/20 1126   09/08/20 0856  vancomycin variable dose per unstable renal function (pharmacist dosing)  Status:  Discontinued         Does not apply See admin instructions 09/08/20 0857 09/09/20 0935   09/02/20 1600  cefTRIAXone (ROCEPHIN) 1 g in sodium chloride 0.9 % 100 mL IVPB  Status:  Discontinued        1 g 200 mL/hr over 30 Minutes Intravenous Every 24 hours 09/01/20 1811 09/02/20 0838   09/01/20 1800  fluconazole (DIFLUCAN) IVPB 400 mg        400 mg 50 mL/hr over 240 Minutes Intravenous  Once 09/01/20 1749 09/02/20 0603   09/01/20 1530  piperacillin-tazobactam (ZOSYN) IVPB 3.375 g        3.375 g 12.5 mL/hr over 240 Minutes Intravenous Every 8 hours 09/01/20 1514 09/05/20 2111   09/01/20 1000  levofloxacin (LEVAQUIN) tablet 250 mg  Status:  Discontinued        250 mg Oral  Daily 08/31/20 1508 08/31/20 1735   08/31/20 1730  cefTRIAXone (ROCEPHIN) 1 g in sodium chloride 0.9 % 100 mL IVPB  Status:  Discontinued        1 g 200 mL/hr over 30 Minutes Intravenous Every 24 hours 08/31/20 1726 09/01/20 1513   08/31/20 1730  azithromycin (ZITHROMAX) 500 mg in sodium chloride 0.9 % 250 mL IVPB  Status:  Discontinued        500 mg 250 mL/hr over 60 Minutes  Intravenous Every 24 hours 08/31/20 1726 09/02/20 0838       Assessment/Plan HTN HLD DM Hx colon cancer s/p partial colectomy/colostomy followed by completion colectomy and ileostomy Hypoxia requiring 3L O2 Covid-19 pneumonia  Elevated D dimer on eliquis -eliquis reversed with kcentra, hold eliquis A fib RVR Anemia AKI on CKD-IIIb Malnutrition - prealbumin26.3 (11/8) from11.8 Acute respiratory failure - vent per CCM Recent Covid pneumonia Shock - off vasopressin and levo down to 85mcg/min Acute blood loss anemia - s/p 3 units PRBCs (11/9) Acute renal failure - on CRRT  Pneumoperitoneum,perforated duodenal ulcer S/pexploratory laparotomy, adhesiolysis, modified graham patch repair of perforated duodenal ulcer11/3 Dr. Bobbye Morton -POD#13 - continue PPI BID - UGI negative for leak11/8 - JP with SS fluid - 25-40 cc per shift, possibly remove soon  - tolerating TF - BID wet to dry dressing changes to midline abdominal wound, making sure debride any loose hematoma  - CT abdomen/pelvis 11/10 stable without evidence of postop complication - surgically stable will see 1-2 times weekly while remains admitted   ID -zosyn 11/3>>11/8.vancomycin 11/10. Maxipime11/10>> VTE -heparin gtt FEN -OG, TF @ 65cc/hr Foley -replaced 11/9 Follow up -Dr. Chrystie Nose - daughter [name] [phone]  LOS: 13 days    Candace [name] , [facility] 09/14/2020, 11:06 AM Please see Amion for pager number during day hours 7:00am-4:30pm

## 2020-09-14 NOTE — Progress Notes (Signed)
NAMEAlisyn [name], MRN:  [id number], DOB:  [birth year], LOS: 61 ADMISSION DATE:  08/31/2020, CONSULTATION DATE:  09/07/2020 REFERRING MD:  Dr Candace [name], CHIEF COMPLAINT:  Acute resp failure  Brief History   69 year old female who was previously diagnosed with Covid 08/23/2020.  Admitted 11/2 with AF-RVR, found to have a perforated duodenal underwent exploratory laparotomy with Candace [name] patch placement.  History of present illness   69 year old black female that presented to the emergency room on 08/31/2020 for worsening of hypoxia and atrial fibrillation with RVR.  She was admitted to medical floor and placed on Cardizem infusion as well as Eliquis.  She then developed abdominal pain and was found to have a perforated duodenal ulcer for which she went to the operating room and had exploratory laparotomy, lysis of adhesions, Graham patch placement.  Onpostop day 6,developed bleeding from the surgical site.  Reported to a saturated dressing.  Patient was noted to have a hemoglobin change from 9-6.9.  During the same period time she had waxing waning blood pressure.  Developed hemorrhagic shock requiring pressors, blood transfusion and intubation  Past Medical History  Covid pneumonia Atrial fibrillation CKD stage III Diabetes mellitus Hypertension Colon cancer Hyperlipidemia  Significant Hospital Events   11/2 Admitted  11/3 OR -> perforated duodenal ulcer 10/10 progressive hemorrhagic shock, intubated, transfused, pressors, proned; started on CRRT in PM  Consults:  Cardiology [facility] Nephrology   Procedures:  11/3 Exploratory laparotomy, Candace [name] patch, lysis of adhesion for duodenal ulceration postop day 6  R PICC 11/5 >> A line 11/9 >> ETT 11/9 >> Lt Dolores CVL 11/9 >> R IJ trialysis >>   Significant Diagnostic Tests:  11/3 CT abd/ pelvis >> 1. Positive for bowel perforation: Pneumoperitoneum and intermediate density free fluid in the abdomen. Prior total colectomy. The specific site of  perforation is unclear-oral contrast present to the proximal jejunum has not obviously leaked. Note that there may be small bowel loops adherent to the ventral abdominal wall along the greater curve of the stomach. 2. Extensive bilateral lower lung pneumonia. No pleural effusion. 3. Other abdominal and pelvic viscera are stable since 2015, including bilateral adrenal adenomas. Chronic renal parapelvic cysts. 4. Aortic Atherosclerosis  11/3 TTE >> EF 70-75%, RV not well visualized, mildly reduced RV systolic function  37/16 CT chest/ abd/ pelvis>> 1. Interval progression of diffuse bilateral hazy ground-glass airspace opacities with more focal areas of consolidation at the lung bases 2. Trace bilateral pleural effusions. 3. Postsurgical changes the abdomen as detailed above. No evidence for a postoperative abscess, however evaluation is limited by lack of IV contrast. 4. There is a 1.9 cm cystic appearing lesion located in the pancreatic body. This was not present on the patient's CT from 2015.  Follow-up with an outpatient contrast enhanced MRI is recommended. 5. The endometrial stripe appears diffusely thickened. Follow-up with pelvic ultrasound is recommended. Aortic Atherosclerosis  11/10 LE doppler studies >>  Micro Data:  11/10 MRSA PCR >> neg 10/10 BC x 2 >>  10/10 trach asp >>  Antimicrobials:  azithro 11/2 >>11/3 Ceftriaxone 11/2  Fluconazole 11/3 Zosyn 11/3 >> 11/7 Vanc 11/10 Cefepime 11/10 >>  Subjective:  WBC 11.7, platelets 35K T Max 99.1 Mag 2.3 HGB drop to 6.7, transfused 1 unit, repeat is 7.8     Objective   Blood pressure 137/71, pulse 96, temperature (!) 96.9 F (36.1 C), temperature source Axillary, resp. rate 18, height 5\' 8"  (1.727 m), weight 90.3 kg, SpO2 99 %.    Vent  Mode: PSV FiO2 (%):  [40 %-50 %] 40 % Set Rate:  [30 bmp] 30 bmp Vt Set:  [400 mL] 400 mL PEEP:  [5 cmH20-10 cmH20] 5 cmH20 Pressure Support:  [5 cmH20] 5 cmH20 Plateau Pressure:   [20 cmH20-26 cmH20] 26 cmH20   Intake/Output Summary (Last 24 hours) at 09/14/2020 1054 Last data filed at 09/14/2020 1000 Gross per 24 hour  Intake 2493.07 ml  Output 2910 ml  Net -416.93 ml   Filed Weights   09/12/20 0257 09/13/20 0314 09/14/20 0500  Weight: 88.3 kg 88.8 kg 90.3 kg    Examination: General: fm, intubated on life support, on CVVHD, in NAD HEENT: NCAT, sclera clear, soft facial swelling , No JVD, + LAD Neuro: off sedation and answering simple questions, following commands CV: RRR, s1 s2 , No RMG PULM: Bilateral chest excursion, Coarse, few crackles per bases GI: soft, nd, NT, BS +, tolerating TF Extremities: generalized edema  Skin: no rash , no lesions, warm , dry and intact   Assessment & Plan:   Shock, multifactorial,  hemorrhagic and septic HGB drop 11/16 am, transfused 1 unit  Plan: - Off pressors  - follow hgb, transfus for hgb <7  - weaned from stress dose steroids  - continue cefepime, complete 7 days   ARDS COVID pneumonia,  possible secondary bacterial HCAP  Tolerating CPAP trials Continue mechanical ventilation per ARDS protocol Target TVof 6-8cc/kgIBW Target Plateau Pressure < 30cm H20 Target driving pressure less than 15 cm of water Target PaO2 55-65: titrate PEEP/FiO2 per protocol Continue to pull volume per CVVHD Ventilator associated pneumonia prevention protocol Off all sedation   Contact precautions -Patient has reached 21-day window however due to critical illness and progressive ARDS multiorgan failure state after discussion with infectious disease recommending continuation of contact precautions.  Acute blood loss anemia HGB drop to 6.7 Platelets are 35K -CT abdomen stable - post-op care per surgery  - Trend CBC daily - Transfuse for HGB < 7  Bilateral LE DVT - unable to start [facility] due to thrombocytopenia  - monitor for hemodynamic change  Atrial fibrillation/RVR  - holding ac due to thrombocytopenia   AKI - oliguric  likely ATN w/ hyperkalemia - on cvvhd per nephrology - Trend BMET - treat and  replete electrolytes as needed  Duodenal perforation/ pneumoperitoneum   - S/pexploratory laparotomy, adhesiolysis, modified graham patch repair of perforated duodenal ulcer11/3; UGI neg for leak 11/8 -Follow JP output -Postop care per general surgery, we appreciate their input.  Acute liver injury Transaminitis, AST 133, ALT 616 - ? 2/2 TPN vs shock liver  -Suspect related to shock liver Plan: Continue to follow  Seems to be improving   Thrombocytopenia - DIC panel neg - HIT panel negative - continuing to observe  - multifactorial etiology   1.9 cm cystic appearing lesion located in the pancreatic body - found on CT 11/11 - outpatient follow up   Pt. Tolerated CPAP trial , good volumes and no increase in work of breathing. ABG looks good, following commands. Will turn off all sedation Plan is to a extubate today, 11/16 after sedation has been off for an hour and patient is awake  Best practice:  Diet: Tube feeds Pain/Anxiety/Delirium protocol (if indicated): Off  VAP protocol (if indicated): yes DVT prophylaxis: SCDs GI prophylaxis: Protonix BID Glucose control: Insulin gtt transitioned to SSI/ levemir  Mobility: Bedrest Code Status: Full Family Communication: I called and spoke with the patients daughter.  Disposition: ICU  Labs  CBC: Recent Labs  Lab 09/09/20 0428 09/09/20 0428 09/09/20 0755 09/11/20 1004 09/11/20 1004 09/12/20 0252 09/12/20 1916 09/13/20 0306 09/14/20 0520 09/14/20 0851  WBC 23.7*  --   --  28.7*  --  27.9*  --  19.5* 11.7*  --   NEUTROABS  --   --   --   --   --   --   --  16.9*  --   --   HGB 7.5*   < >  --  8.1*   < > 8.0* 7.5* 7.1* 6.7* 7.8*  HCT 22.3*   < >  --  24.9*   < > 24.4* 22.0* 21.9* 21.1* 23.0*  MCV 78.0*  --   --  78.1*  --  79.2*  --  79.9* 81.8  --   PLT 65*   < > 68* 42*  --  39*  --  34* 35*  --    < > = values in this interval not  displayed.    Basic Metabolic Panel: Recent Labs  Lab 09/10/20 0329 09/10/20 1822 09/11/20 0314 09/11/20 1646 09/12/20 0252 09/12/20 0252 09/12/20 1706 09/12/20 1706 09/12/20 1916 09/13/20 0306 09/13/20 1644 09/14/20 0520 09/14/20 0851  NA 136   < > 134*   135   < > 136   136   < > 135   < > 134* 136 134* 138 139  K 4.4   < > 4.0   4.0   < > 3.9   3.9   < > 4.5   < > 4.4 3.8 4.4 4.2 4.0  CL 104   < > 103   104   < > 104   105  --  103  --   --  104 101 106  --   CO2 25   < > 24   25   < > 26   26  --  26  --   --  27 25 27   --   GLUCOSE 184*   < > 207*   206*   < > 147*   148*  --  177*  --   --  199* 270* 129*  --   BUN 35*   < > 31*   31*   < > 26*   26*  --  24*  --   --  27* 30* 30*  --   CREATININE 1.19*   < > 1.10*   1.05*   < > 0.90   0.90  --  0.87  --   --  0.87 0.75 0.76  --   CALCIUM 8.1*   < > 8.2*   8.2*   < > 8.7*   8.7*  --  8.6*  --   --  8.4* 8.3* 8.3*  --   MG 2.4  --  2.5*  --  2.4  --   --   --   --  2.3  --  2.3  --   PHOS 4.1  --  3.0   < > 2.2*  --  3.5  --   --  2.7 2.0* 2.0*  --    < > = values in this interval not displayed.   GFR: Estimated Creatinine Clearance: 78.1 mL/min (by C-G formula based on SCr of 0.76 mg/dL). Recent Labs  Lab 09/07/20 2202 09/07/20 2232 09/08/20 0446 09/08/20 0446 09/08/20 1212 09/08/20 2218 09/09/20 0428 09/09/20 0428 09/10/20 0329 09/11/20 0314 09/11/20 1004 09/12/20 0252 09/13/20  4944 09/14/20 0520  PROCALCITON   < >  --  7.57   < >  --   --  12.02  --  10.16 5.90  --  5.29  --   --   WBC   < >  --  28.7*   < >  --   --  23.7*   < >  --   --  28.7* 27.9* 19.5* 11.7*  LATICACIDVEN  --  9.9* 9.4*  --  4.1* 2.5*  --   --   --   --   --   --   --   --    < > = values in this interval not displayed.    Liver Function Tests: Recent Labs  Lab 09/10/20 1822 09/10/20 1822 09/11/20 0314 09/11/20 1646 09/12/20 0252 09/12/20 1706 09/13/20 0306 09/13/20 1644 09/14/20 0520  AST 1,481*  --  1,002*  --  448*   --  232*  --  133*  ALT 2,693*  --  2,400*  --  1,542*  --  964*  --  616*  ALKPHOS 173*  --  236*  --  238*  --  237*  --  240*  BILITOT 1.2  --  1.0  --  0.9  --  1.2  --  0.5  PROT 4.8*  --  5.0*  --  4.9*  --  4.8*  --  4.8*  ALBUMIN 1.9*   < > 1.9*   1.8*   < > 1.7*   1.7* 1.6* 1.5* 1.6* 1.5*   < > = values in this interval not displayed.   No results for input(s): LIPASE, AMYLASE in the last 168 hours. No results for input(s): AMMONIA in the last 168 hours.  ABG    Component Value Date/Time   PHART 7.412 09/14/2020 0851   PCO2ART 40.4 09/14/2020 0851   PO2ART 70 (L) 09/14/2020 0851   HCO3 26.0 09/14/2020 0851   TCO2 27 09/14/2020 0851   ACIDBASEDEF 0.2 09/11/2020 1725   O2SAT 95.0 09/14/2020 0851      Magdalen Spatz, MSN, AGACNP-BC [place] for personal pager [facility] on call pager [phone] 09/14/2020 10:53 AM

## 2020-09-14 NOTE — Progress Notes (Signed)
[facility] ROUNDING NOTE   Subjective:   Brief history 70 year old with history of diabetes hypertension hyperlipidemia colon cancer status post partial colectomy colostomy admitted 08/31/2020 with atrial fibrillation rapid ventricular rate Covid pneumonia found to have perforated duodenal ulcer underwent exploratory laparotomy developed hemorrhagic shock respiratory failure.  Creatinine 5.6 mg/dL on admission secondary to atrial fibrillation and hemodynamic instability.  CT scan did not reveal any evidence of hydronephrosis was started on CRRT 09/08/2020 for oliguric renal failure.  Blood pressure 123/69 pulse 101 temperature 98.3 O2 sats 96% 40% FiO2  IV vasopressin, IV norepinephrine  Kept even on CRRT oliguric/anuric  Sodium 138 potassium 4.2 chloride 106 CO2 27 BUN 30 creatinine 0.76 glucose 129 calcium 8.3 phosphorus 2 magnesium 2.3 hemoglobin 6.7     Objective:  Vital signs in last 24 hours:  Temp:  [96.6 F (35.9 C)-99.1 F (37.3 C)] 98.3 F (36.8 C) (11/16 0400) Pulse Rate:  [81-110] 110 (11/16 0600) Resp:  [30] 30 (11/15 1137) BP: (102-142)/(53-75) 134/68 (11/16 0600) SpO2:  [88 %-100 %] 97 % (11/16 0600) Arterial Line BP: (113-166)/(41-64) 142/52 (11/16 0600) FiO2 (%):  [40 %-50 %] 40 % (11/16 0439) Weight:  [90.3 kg] 90.3 kg (11/16 0500)  Weight change: 1.5 kg Filed Weights   09/12/20 0257 09/13/20 0314 09/14/20 0500  Weight: 88.3 kg 88.8 kg 90.3 kg    Intake/Output: I/O last 3 completed shifts: In: 3964.2 [I.V.:911.4; Other:105; GB/MS:1115; IV Piggyback:582.8] Out: 4594 [Urine:45; Drains:180; Other:3294; ZMCEY:2233]   Intake/Output this shift:  No intake/output data recorded.  General: Critically ill looking female intubated, sedated  Heart:RRR, s1s2 nl Lungs: Coarse breath sound posteriorly Abdomen:soft,  Extremities:No edema Dialysis Access: Right IJ temporary HD catheter placed by ICU on 11/10.   Basic Metabolic Panel: Recent Labs   Lab 09/10/20 0329 09/10/20 1822 09/11/20 0314 09/11/20 1646 09/12/20 0252 09/12/20 0252 09/12/20 1706 09/12/20 1706 09/12/20 1916 09/13/20 0306 09/13/20 1644 09/14/20 0520  NA 136   < > 134*  135   < > 136  136   < > 135  --  134* 136 134* 138  K 4.4   < > 4.0  4.0   < > 3.9  3.9   < > 4.5  --  4.4 3.8 4.4 4.2  CL 104   < > 103  104   < > 104  105  --  103  --   --  104 101 106  CO2 25   < > 24  25   < > 26  26  --  26  --   --  27 25 27   GLUCOSE 184*   < > 207*  206*   < > 147*  148*  --  177*  --   --  199* 270* 129*  BUN 35*   < > 31*  31*   < > 26*  26*  --  24*  --   --  27* 30* 30*  CREATININE 1.19*   < > 1.10*  1.05*   < > 0.90  0.90  --  0.87  --   --  0.87 0.75 0.76  CALCIUM 8.1*   < > 8.2*  8.2*   < > 8.7*  8.7*   < > 8.6*   < >  --  8.4* 8.3* 8.3*  MG 2.4  --  2.5*  --  2.4  --   --   --   --  2.3  --  2.3  PHOS 4.1  --  3.0   < > 2.2*  --  3.5  --   --  2.7 2.0* 2.0*   < > = values in this interval not displayed.    Liver Function Tests: Recent Labs  Lab 09/10/20 1822 09/10/20 1822 09/11/20 0314 09/11/20 1646 09/12/20 0252 09/12/20 1706 09/13/20 0306 09/13/20 1644 09/14/20 0520  AST 1,481*  --  1,002*  --  448*  --  232*  --  133*  ALT 2,693*  --  2,400*  --  1,542*  --  964*  --  616*  ALKPHOS 173*  --  236*  --  238*  --  237*  --  240*  BILITOT 1.2  --  1.0  --  0.9  --  1.2  --  0.5  PROT 4.8*  --  5.0*  --  4.9*  --  4.8*  --  4.8*  ALBUMIN 1.9*   < > 1.9*  1.8*   < > 1.7*  1.7* 1.6* 1.5* 1.6* 1.5*   < > = values in this interval not displayed.   No results for input(s): LIPASE, AMYLASE in the last 168 hours. No results for input(s): AMMONIA in the last 168 hours.  CBC: Recent Labs  Lab 09/09/20 0428 09/09/20 0428 09/09/20 0755 09/11/20 1004 09/12/20 0252 09/12/20 1916 09/13/20 0306 09/14/20 0520  WBC 23.7*  --   --  28.7* 27.9*  --  19.5* 11.7*  NEUTROABS  --   --   --   --   --   --  16.9*  --   HGB 7.5*   < >  --   8.1* 8.0* 7.5* 7.1* 6.7*  HCT 22.3*   < >  --  24.9* 24.4* 22.0* 21.9* 21.1*  MCV 78.0*  --   --  78.1* 79.2*  --  79.9* 81.8  PLT 65*   < > 68* 42* 39*  --  34* 35*   < > = values in this interval not displayed.    Cardiac Enzymes: No results for input(s): CKTOTAL, CKMB, CKMBINDEX, TROPONINI in the last 168 hours.  BNP: Invalid input(s): POCBNP  CBG: Recent Labs  Lab 09/12/20 2313 09/13/20 0313 09/13/20 0732 09/13/20 1114 09/13/20 1628  GLUCAP 148* 187* 202* 220* 214*    Microbiology: Results for orders placed or performed during the hospital encounter of 08/31/20  MRSA PCR Screening     Status: None   Collection Time: 09/08/20  8:17 AM   Specimen: Nasal Mucosa; Nasopharyngeal  Result Value Ref Range Status   MRSA by PCR NEGATIVE NEGATIVE Final    Comment:        The GeneXpert MRSA Assay (FDA approved for NASAL specimens only), is one component of a comprehensive MRSA colonization surveillance program. It is not intended to diagnose MRSA infection nor to guide or monitor treatment for MRSA infections. Performed at [facility], [place] [address] [place], [place] [address]   Culture, blood (Routine X 2) w Reflex to ID Panel     Status: None   Collection Time: 09/08/20  8:35 AM   Specimen: BLOOD LEFT HAND  Result Value Ref Range Status   Specimen Description BLOOD LEFT HAND  Final   Special Requests   Final    BOTTLES DRAWN AEROBIC ONLY Blood Culture results may not be optimal due to an inadequate volume of blood received in culture bottles   Culture   Final    NO GROWTH 5  DAYS Performed at [facility], [place] [address], [place], [place] [address]    Report Status 09/13/2020 FINAL  Final  Culture, blood (Routine X 2) w Reflex to ID Panel     Status: None   Collection Time: 09/08/20  8:35 AM   Specimen: BLOOD RIGHT HAND  Result Value Ref Range Status   Specimen Description BLOOD RIGHT HAND  Final   Special Requests   Final    BOTTLES DRAWN AEROBIC  ONLY Blood Culture results may not be optimal due to an inadequate volume of blood received in culture bottles   Culture   Final    NO GROWTH 5 DAYS Performed at [facility], [place] [address] [place], [place] [address]    Report Status 09/13/2020 FINAL  Final    Coagulation Studies: Recent Labs    09/13/20 0438  LABPROT 13.0  INR 1.0    Urinalysis: No results for input(s): COLORURINE, LABSPEC, PHURINE, GLUCOSEU, HGBUR, BILIRUBINUR, KETONESUR, PROTEINUR, UROBILINOGEN, NITRITE, LEUKOCYTESUR in the last 72 hours.  Invalid input(s): APPERANCEUR    Imaging: DG CHEST PORT 1 VIEW  Result Date: 09/13/2020 CLINICAL DATA:  Respiratory failure EXAM: PORTABLE CHEST 1 VIEW COMPARISON:  09/11/2020 FINDINGS: Endotracheal tube is seen 4.3 cm above the carina. Nasoenteric feeding tube extends into the upper abdomen beyond the margin of the exam. Right internal jugular temporary dialysis catheter tip seen within the superior vena cava. Right upper extremity PICC line tip seen within the superior vena cava. Left subclavian central venous catheter has been removed. Asymmetric right mid and bibasilar pulmonary infiltrates appears stable since prior examination. Tiny right pleural effusion is again noted. No pneumothorax. Cardiac size within normal limits. IMPRESSION: Stable support lines and tubes save for interval removal of the left subclavian central venous catheter. Stable asymmetric mid and lower lung zone pulmonary infiltrates. Electronically Signed   By: Fidela Salisbury MD   On: 09/13/2020 07:00   VAS [place] LOWER EXTREMITY VENOUS (DVT)  Result Date: 09/13/2020  Lower Venous DVT Study Indications: Covie-19, elevated D-Dimer.  Limitations: Body habitus and edema. Comparison Study: Prior negative study done 08/24/20 Performing Technologist: Sharion Dove RVS  Examination Guidelines: A complete evaluation includes B-mode imaging, spectral Doppler, color Doppler, and power Doppler as needed of all  accessible portions of each vessel. Bilateral testing is considered an integral part of a complete examination. Limited examinations for reoccurring indications may be performed as noted. The reflux portion of the exam is performed with the patient in reverse Trendelenburg.  +---------+---------------+---------+-----------+----------+-----------------+ RIGHT    CompressibilityPhasicitySpontaneityPropertiesThrombus Aging    +---------+---------------+---------+-----------+----------+-----------------+ CFV      Full           Yes      Yes                                    +---------+---------------+---------+-----------+----------+-----------------+ SFJ      Full                                                           +---------+---------------+---------+-----------+----------+-----------------+ FV Prox  Full                                                           +---------+---------------+---------+-----------+----------+-----------------+  FV Mid   Full                                                           +---------+---------------+---------+-----------+----------+-----------------+ FV DistalFull                                                           +---------+---------------+---------+-----------+----------+-----------------+ PFV      Full                                                           +---------+---------------+---------+-----------+----------+-----------------+ POP      Full                                                           +---------+---------------+---------+-----------+----------+-----------------+ PTV      None                                         Age Indeterminate +---------+---------------+---------+-----------+----------+-----------------+ PERO     None                                         Age Indeterminate +---------+---------------+---------+-----------+----------+-----------------+    +---------+---------------+---------+-----------+----------+-------------------+ LEFT     CompressibilityPhasicitySpontaneityPropertiesThrombus Aging      +---------+---------------+---------+-----------+----------+-------------------+ CFV      Full           Yes      Yes                                      +---------+---------------+---------+-----------+----------+-------------------+ SFJ      Full                                                             +---------+---------------+---------+-----------+----------+-------------------+ FV Prox  Full                                                             +---------+---------------+---------+-----------+----------+-------------------+ FV Mid   Full                                                             +---------+---------------+---------+-----------+----------+-------------------+  FV Distal               Yes      Yes                  patent by color and                                                       Doppler             +---------+---------------+---------+-----------+----------+-------------------+ PFV      Full                                                             +---------+---------------+---------+-----------+----------+-------------------+ POP      Full           Yes      Yes                                      +---------+---------------+---------+-----------+----------+-------------------+ PTV      None                                         Age Indeterminate   +---------+---------------+---------+-----------+----------+-------------------+ PERO                                                  Not well visualized +---------+---------------+---------+-----------+----------+-------------------+     Summary: RIGHT: - 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  LEFT: -  Findings consistent with age indeterminate deep vein thrombosis involving the left posterior tibial veins.  *See table(s) above for measurements and observations. Electronically signed by Servando Snare MD on 09/13/2020 at 4:32:47 PM.    Final      Medications:   .  prismasol BGK 4/2.5 500 mL/hr at 09/13/20 2208  .  prismasol BGK 4/2.5 500 mL/hr at 09/13/20 2158  . ceFEPime (MAXIPIME) IV Stopped (09/13/20 2133)  . fentaNYL infusion INTRAVENOUS 100 mcg/hr (09/14/20 0600)  . midazolam Stopped (09/13/20 0751)  . norepinephrine (LEVOPHED) Adult infusion Stopped (09/14/20 0402)  . prismasol BGK 4/2.5 2,000 mL/hr at 09/14/20 0539  . vasopressin Stopped (09/14/20 0013)   . artificial tears  1 application Both Eyes O8T  . B-complex with vitamin C  1 tablet Per Tube Daily  . bisacodyl  10 mg Rectal Once  . chlorhexidine gluconate (MEDLINE KIT)  15 mL Mouth Rinse BID  . Chlorhexidine Gluconate Cloth  6 each Topical Daily  . docusate  100 mg Per Tube BID  . feeding supplement (PIVOT 1.5 CAL)  1,000 mL Per Tube Q24H  . insulin aspart  0-20 Units Subcutaneous Q4H  . insulin aspart  10 Units Subcutaneous Q4H  . insulin detemir  35 Units Subcutaneous BID  . mouth rinse  15 mL  Mouth Rinse 10 times per day  . oxyCODONE  5 mg Per Tube Q6H  . pantoprazole sodium  40 mg Per Tube BID  . polyethylene glycol  17 g Per Tube Daily   acetaminophen (TYLENOL) oral liquid 160 mg/5 mL, albuterol, diphenhydrAMINE, fentaNYL, heparin, heparin, midazolam, phenol, silver nitrate applicators, sodium chloride, vecuronium  Assessment/ Plan:  1.Acute kidney injury, oliguric likely ATN due to hemorrhagic shock: Patient initially had AKI with creatinine level of 5.6 on admission due to A. fib with RVR/hemodynamic instability which was improved. However she developed another episode of AKI after shock.  Urinalysis was contaminated/UTI. CT scan A/P without hydronephrosis. Started CRRT on 11/10 because of oliguria and  hyperkalemia.  Tolerating well continues on pressors.  Dialysate and replacement fluids 4/2.5  2.Hyperkalemia due to AKI and intra-abdominal bleeding/PRBC transfusion: Now managed with CRRT.  3.Hypophosphatemia: Replete phosphate.  4.Hemorrhagic shock: Received blood transfusion.  Currently on Levophed and vasopressin.Maintain MAP >65.   5.Perforated duodenal ulcer status post ex lap: General surgery is following. Currently on tube feed.  6.ARDS/recent Covid pneumonia: Currently on mechanical ventilation, prone position. Per primary team.   LOS: [place] @TODAY @7 :20 AM

## 2020-09-14 NOTE — Progress Notes (Signed)
Assisted tele visit to patient with daughter.  Thomas, Maryanne Huneycutt Renee, RN   

## 2020-09-14 NOTE — Procedures (Signed)
Intubation Procedure Note  Candace [name]  [id number]  Dec 03, 1950  Date:09/14/20  Time:11:29 PM   Provider Performing:[name]    Procedure: Intubation (31500)  Indication(s) Respiratory Failure  Consent Risks of the procedure as well as the alternatives and risks of each were explained to the patient and/or caregiver.  Consent for the procedure was obtained and is signed in the bedside chart   Anesthesia Etomidate and Rocuronium   Time Out Verified patient identification, verified procedure, site/side was marked, verified correct patient position, special equipment/implants available, medications/allergies/relevant history reviewed, required imaging and test results available.   Sterile Technique Usual hand hygeine, masks, and gloves were used   Procedure Description Patient positioned in bed supine.  Sedation given as noted above.  Patient was intubated with endotracheal tube using Glidescope.  View was Grade 1 full glottis .  Number of attempts was 1.  Colorimetric CO2 detector was consistent with tracheal placement.   Complications/Tolerance None; patient tolerated the procedure well. Chest X-ray is ordered to verify placement.   EBL None   Specimen(s) None

## 2020-09-14 NOTE — Therapy (Signed)
Pt assessed and noted to have increased WOB and desaturation. Pt was received on 15L HFNC but was placed on NIV via the vent. Will continue to monitor and reassess.

## 2020-09-14 NOTE — Progress Notes (Signed)
IR consulted by Eric Form, NP for possible image-guided IVCF.  Case/images have been reviewed by Dr. Annamaria Boots who does not recommend IVCF placement at this time- no evidence of significant acute popliteal DVT, indeterminate calf findings seen on [place], and procedure limited by patient's body habitus. No plans for IR interventions at this time- will delete order. Above was discussed between Dr. Annamaria Boots and Dr. Tacy Learn.  IR available in future if needed.   Bea Graff Autumn Pruitt, PA-C 09/14/2020, 2:32 PM

## 2020-09-14 NOTE — Progress Notes (Signed)
Assisted tele visit to patient with daughter. ° °Lliam Hoh P, RN  °

## 2020-09-15 ENCOUNTER — Inpatient Hospital Stay (HOSPITAL_COMMUNITY): Payer: Medicare Other

## 2020-09-15 DIAGNOSIS — U071 COVID-19: Secondary | ICD-10-CM

## 2020-09-15 LAB — GLUCOSE, CAPILLARY
Glucose-Capillary: 106 mg/dL — ABNORMAL HIGH (ref 70–99)
Glucose-Capillary: 119 mg/dL — ABNORMAL HIGH (ref 70–99)
Glucose-Capillary: 128 mg/dL — ABNORMAL HIGH (ref 70–99)
Glucose-Capillary: 139 mg/dL — ABNORMAL HIGH (ref 70–99)
Glucose-Capillary: 139 mg/dL — ABNORMAL HIGH (ref 70–99)
Glucose-Capillary: 141 mg/dL — ABNORMAL HIGH (ref 70–99)
Glucose-Capillary: 40 mg/dL — CL (ref 70–99)
Glucose-Capillary: 59 mg/dL — ABNORMAL LOW (ref 70–99)
Glucose-Capillary: 63 mg/dL — ABNORMAL LOW (ref 70–99)
Glucose-Capillary: 87 mg/dL (ref 70–99)
Glucose-Capillary: 96 mg/dL (ref 70–99)

## 2020-09-15 LAB — DIC (DISSEMINATED INTRAVASCULAR COAGULATION)PANEL
D-Dimer, Quant: >20 ug/mL-FEU — ABNORMAL HIGH (ref 0.00–0.50)
Fibrinogen: 767 mg/dL — ABNORMAL HIGH (ref 210–475)
INR: 1.2 (ref 0.8–1.2)
Platelets: 27 10*3/uL — CL (ref 150–400)
Prothrombin Time: 14.4 seconds (ref 11.4–15.2)
Smear Review: NONE SEEN
aPTT: 42 seconds — ABNORMAL HIGH (ref 24–36)

## 2020-09-15 LAB — COMPREHENSIVE METABOLIC PANEL
ALT: 515 U/L — ABNORMAL HIGH (ref 0–44)
AST: 127 U/L — ABNORMAL HIGH (ref 15–41)
Albumin: 1.4 g/dL — ABNORMAL LOW (ref 3.5–5.0)
Alkaline Phosphatase: 287 U/L — ABNORMAL HIGH (ref 38–126)
Anion gap: 4 — ABNORMAL LOW (ref 5–15)
BUN: 24 mg/dL — ABNORMAL HIGH (ref 8–23)
CO2: 25 mmol/L (ref 22–32)
Calcium: 8.4 mg/dL — ABNORMAL LOW (ref 8.9–10.3)
Chloride: 108 mmol/L (ref 98–111)
Creatinine, Ser: 0.7 mg/dL (ref 0.44–1.00)
GFR, Estimated: >60 mL/min (ref >60)
Glucose, Bld: 122 mg/dL — ABNORMAL HIGH (ref 70–99)
Potassium: 4.8 mmol/L (ref 3.5–5.1)
Sodium: 137 mmol/L (ref 135–145)
Total Bilirubin: 1.1 mg/dL (ref 0.3–1.2)
Total Protein: 5.1 g/dL — ABNORMAL LOW (ref 6.5–8.1)

## 2020-09-15 LAB — POCT I-STAT 7, (LYTES, BLD GAS, ICA,H+H)
Acid-base deficit: 1 mmol/L (ref 0.0–2.0)
Bicarbonate: 27.3 mmol/L (ref 20.0–28.0)
Calcium, Ion: 1.3 mmol/L (ref 1.15–1.40)
HCT: 25 % — ABNORMAL LOW (ref 36.0–46.0)
Hemoglobin: 8.5 g/dL — ABNORMAL LOW (ref 12.0–15.0)
O2 Saturation: 97 %
Patient temperature: 97.7
Potassium: 4.9 mmol/L (ref 3.5–5.1)
Sodium: 138 mmol/L (ref 135–145)
TCO2: 29 mmol/L (ref 22–32)
pCO2 arterial: 64.3 mmHg — ABNORMAL HIGH (ref 32.0–48.0)
pH, Arterial: 7.234 — ABNORMAL LOW (ref 7.350–7.450)
pO2, Arterial: 108 mmHg (ref 83.0–108.0)

## 2020-09-15 LAB — CBC
HCT: 30.1 % — ABNORMAL LOW (ref 36.0–46.0)
Hemoglobin: 9.6 g/dL — ABNORMAL LOW (ref 12.0–15.0)
MCH: 26.1 pg (ref 26.0–34.0)
MCHC: 31.9 g/dL (ref 30.0–36.0)
MCV: 81.8 fL (ref 80.0–100.0)
Platelets: 26 10*3/uL — CL (ref 150–400)
RBC: 3.68 MIL/uL — ABNORMAL LOW (ref 3.87–5.11)
RDW: 19.3 % — ABNORMAL HIGH (ref 11.5–15.5)
WBC: 7.6 10*3/uL (ref 4.0–10.5)
nRBC: 31.9 % — ABNORMAL HIGH (ref 0.0–0.2)

## 2020-09-15 LAB — RENAL FUNCTION PANEL
Albumin: 1.2 g/dL — ABNORMAL LOW (ref 3.5–5.0)
Anion gap: 5 (ref 5–15)
BUN: 38 mg/dL — ABNORMAL HIGH (ref 8–23)
CO2: 24 mmol/L (ref 22–32)
Calcium: 8.2 mg/dL — ABNORMAL LOW (ref 8.9–10.3)
Chloride: 109 mmol/L (ref 98–111)
Creatinine, Ser: 0.93 mg/dL (ref 0.44–1.00)
GFR, Estimated: >60 mL/min (ref >60)
Glucose, Bld: 138 mg/dL — ABNORMAL HIGH (ref 70–99)
Phosphorus: 4.1 mg/dL (ref 2.5–4.6)
Potassium: 5 mmol/L (ref 3.5–5.1)
Sodium: 138 mmol/L (ref 135–145)

## 2020-09-15 LAB — BLOOD GAS, ARTERIAL
Acid-base deficit: 1.1 mmol/L (ref 0.0–2.0)
Acid-base deficit: 1.9 mmol/L (ref 0.0–2.0)
Acid-base deficit: 3.2 mmol/L — ABNORMAL HIGH (ref 0.0–2.0)
Bicarbonate: 23.5 mmol/L (ref 20.0–28.0)
Bicarbonate: 24.5 mmol/L (ref 20.0–28.0)
Bicarbonate: 26 mmol/L (ref 20.0–28.0)
Drawn by: 60057
Drawn by: 60057
Drawn by: 60057
FIO2: 100
FIO2: 100
FIO2: 80
O2 Saturation: 70.2 %
O2 Saturation: 88.9 %
O2 Saturation: 97.8 %
Patient temperature: 37
Patient temperature: 37
Patient temperature: 37
pCO2 arterial: 59.9 mmHg — ABNORMAL HIGH (ref 32.0–48.0)
pCO2 arterial: 59.9 mmHg — ABNORMAL HIGH (ref 32.0–48.0)
pCO2 arterial: 68.7 mmHg (ref 32.0–48.0)
pH, Arterial: 7.203 — ABNORMAL LOW (ref 7.350–7.450)
pH, Arterial: 7.218 — ABNORMAL LOW (ref 7.350–7.450)
pH, Arterial: 7.236 — ABNORMAL LOW (ref 7.350–7.450)
pO2, Arterial: 110 mmHg — ABNORMAL HIGH (ref 83.0–108.0)
pO2, Arterial: 44.9 mmHg — ABNORMAL LOW (ref 83.0–108.0)
pO2, Arterial: 62.8 mmHg — ABNORMAL LOW (ref 83.0–108.0)

## 2020-09-15 LAB — MAGNESIUM: Magnesium: 2.4 mg/dL (ref 1.7–2.4)

## 2020-09-15 LAB — PHOSPHORUS: Phosphorus: 3 mg/dL (ref 2.5–4.6)

## 2020-09-15 MED ORDER — PROSOURCE TF PO LIQD
45.0000 mL | Freq: Two times a day (BID) | ORAL | Status: DC
  Administered 2020-09-15 – 2020-09-29 (×28): 45 mL
  Filled 2020-09-15 (×28): qty 45

## 2020-09-15 MED ORDER — CHLORHEXIDINE GLUCONATE 0.12% ORAL RINSE (MEDLINE KIT)
15.0000 mL | Freq: Two times a day (BID) | OROMUCOSAL | Status: DC
  Administered 2020-09-15 – 2020-12-23 (×183): 15 mL via OROMUCOSAL

## 2020-09-15 MED ORDER — ARTIFICIAL TEARS OPHTHALMIC OINT
1.0000 "application " | TOPICAL_OINTMENT | Freq: Three times a day (TID) | OPHTHALMIC | Status: DC
  Administered 2020-09-15 – 2020-09-16 (×3): 1 via OPHTHALMIC
  Filled 2020-09-15: qty 3.5

## 2020-09-15 MED ORDER — DEXTROSE 50 % IV SOLN
INTRAVENOUS | Status: AC
  Administered 2020-09-15: 50 mL via INTRAVENOUS
  Filled 2020-09-15: qty 50

## 2020-09-15 MED ORDER — CISATRACURIUM BOLUS VIA INFUSION
0.0500 mg/kg | Freq: Once | INTRAVENOUS | Status: DC
  Filled 2020-09-15: qty 5

## 2020-09-15 MED ORDER — SODIUM CHLORIDE 0.9 % IV SOLN
INTRAVENOUS | Status: DC | PRN
  Administered 2020-09-15: 1000 mL via INTRAVENOUS
  Administered 2020-09-25: 250 mL via INTRAVENOUS
  Administered 2020-09-26 – 2020-10-11 (×3): 500 mL via INTRAVENOUS
  Administered 2020-10-16 – 2020-10-17 (×3): 250 mL via INTRAVENOUS
  Administered 2020-10-20 – 2020-10-30 (×5): 500 mL via INTRAVENOUS
  Administered 2020-11-05 – 2020-11-10 (×3): 250 mL via INTRAVENOUS

## 2020-09-15 MED ORDER — SODIUM CHLORIDE 0.9% FLUSH
10.0000 mL | Freq: Two times a day (BID) | INTRAVENOUS | Status: DC
  Administered 2020-09-15 – 2020-09-17 (×5): 10 mL
  Administered 2020-09-19: 10:00:00 20 mL
  Administered 2020-09-19 – 2020-09-29 (×10): 10 mL
  Administered 2020-09-30: 09:00:00 40 mL
  Administered 2020-10-01 – 2020-10-21 (×35): 10 mL
  Administered 2020-10-22: 23:00:00 20 mL
  Administered 2020-10-22 – 2020-10-23 (×2): 10 mL
  Administered 2020-10-23: 22:00:00 20 mL
  Administered 2020-10-24 – 2020-11-01 (×12): 10 mL
  Administered 2020-11-01: 20 mL
  Administered 2020-11-02 – 2020-11-13 (×14): 10 mL
  Administered 2020-11-14: 30 mL
  Administered 2020-11-14 – 2020-11-17 (×6): 10 mL
  Administered 2020-11-17: 40 mL
  Administered 2020-11-18 (×2): 10 mL
  Administered 2020-11-19: 30 mL
  Administered 2020-11-19 – 2020-11-20 (×2): 10 mL
  Administered 2020-11-20: 30 mL
  Administered 2020-11-21: 10 mL
  Administered 2020-11-21: 30 mL
  Administered 2020-11-22: 40 mL
  Administered 2020-11-23 – 2020-12-01 (×17): 10 mL
  Administered 2020-12-02: 20 mL
  Administered 2020-12-02 – 2020-12-08 (×12): 10 mL
  Administered 2020-12-08: 40 mL
  Administered 2020-12-09 – 2020-12-13 (×9): 10 mL
  Administered 2020-12-13: 40 mL
  Administered 2020-12-14 – 2020-12-20 (×13): 10 mL
  Administered 2020-12-20: 20 mL
  Administered 2020-12-21 – 2020-12-22 (×4): 10 mL

## 2020-09-15 MED ORDER — MIDAZOLAM HCL 2 MG/2ML IJ SOLN
1.0000 mg | INTRAMUSCULAR | Status: DC | PRN
  Administered 2020-09-16 – 2020-09-21 (×4): 2 mg via INTRAVENOUS
  Administered 2020-09-21: 1 mg via INTRAVENOUS
  Administered 2020-09-23 – 2020-10-03 (×20): 2 mg via INTRAVENOUS
  Filled 2020-09-15 (×24): qty 2

## 2020-09-15 MED ORDER — SODIUM CHLORIDE 0.9 % IV SOLN
0.0000 ug/kg/min | INTRAVENOUS | Status: DC
  Filled 2020-09-15: qty 20

## 2020-09-15 MED ORDER — FENTANYL BOLUS VIA INFUSION
50.0000 ug | INTRAVENOUS | Status: DC | PRN
  Administered 2020-09-16 – 2020-09-20 (×10): 50 ug via INTRAVENOUS
  Administered 2020-09-21: 100 ug via INTRAVENOUS
  Administered 2020-09-21 – 2020-09-28 (×20): 50 ug via INTRAVENOUS
  Administered 2020-09-28: 25 ug via INTRAVENOUS
  Administered 2020-09-29 (×2): 50 ug via INTRAVENOUS
  Filled 2020-09-15: qty 50

## 2020-09-15 MED ORDER — DEXTROSE 50 % IV SOLN
INTRAVENOUS | Status: AC
  Filled 2020-09-15: qty 50

## 2020-09-15 MED ORDER — DEXTROSE 50 % IV SOLN: 1.0000 | Freq: Once | INTRAVENOUS | Status: AC

## 2020-09-15 MED ORDER — ORAL CARE MOUTH RINSE
15.0000 mL | OROMUCOSAL | Status: DC
  Administered 2020-09-15 – 2020-09-25 (×99): 15 mL via OROMUCOSAL

## 2020-09-15 MED ORDER — STERILE WATER FOR INJECTION IJ SOLN
INTRAMUSCULAR | Status: AC
  Filled 2020-09-15: qty 10

## 2020-09-15 MED ORDER — ALTEPLASE 2 MG IJ SOLR
2.0000 mg | Freq: Once | INTRAMUSCULAR | Status: AC
  Administered 2020-09-15: 2 mg
  Filled 2020-09-15: qty 2

## 2020-09-15 MED ORDER — SODIUM CHLORIDE 0.9% FLUSH
10.0000 mL | INTRAVENOUS | Status: DC | PRN
  Administered 2020-09-25 – 2020-11-22 (×4): 10 mL

## 2020-09-15 MED ORDER — FENTANYL BOLUS VIA INFUSION
20.0000 ug | INTRAVENOUS | Status: DC | PRN
  Filled 2020-09-15: qty 20

## 2020-09-15 MED ORDER — HYDROCOD POLST-CPM POLST ER 10-8 MG/5ML PO SUER
5.0000 mL | Freq: Two times a day (BID) | ORAL | Status: DC
  Administered 2020-09-15 – 2020-10-01 (×33): 5 mL
  Filled 2020-09-15 (×33): qty 5

## 2020-09-15 NOTE — Progress Notes (Signed)
Inpatient Diabetes Program Recommendations  AACE/ADA: New Consensus Statement on Inpatient Glycemic Control (2015)  Target Ranges:  Prepandial:   less than 140 mg/dL      Peak postprandial:   less than 180 mg/dL (1-2 hours)      Critically ill patients:  140 - 180 mg/dL   Lab Results  Component Value Date   GLUCAP 106 (H) 09/15/2020   HGBA1C 7.0 (H) 08/24/2020    Review of Glycemic Control Results for [name], Candace [name] (MRN [id number]) as of 09/15/2020 12:11  Ref. Range 09/15/2020 00:00 09/15/2020 00:15 09/15/2020 03:32 09/15/2020 04:00 09/15/2020 07:43  Glucose-Capillary Latest Ref Range: 70 - 99 mg/dL 59 (L) 128 (H) 40 (LL) 139 (H) 106 (H)   Diabetes history:  T2DM Current orders for Inpatient glycemic control:  levemir 35 units bid Novolog 10 units q4h Novolog 0-20 units q4h  Inpatient Diabetes Program Recommendations:     Please decrease Lantus to 30 units bid  Will continue to follow while inpatient.  Thank you, Reche Dixon, RN, BSN Diabetes Coordinator Inpatient Diabetes Program [phone] (team pager from 8a-5p)

## 2020-09-15 NOTE — Progress Notes (Signed)
Nutrition Follow-up  DOCUMENTATION CODES:   Obesity unspecified  INTERVENTION:   Tube Feeding via Cortrak: Pivot 1.5 at 65 ml/hr Pro-Source TF 45 mL BID Provides 168 g of protein, 2420 kcals and 1186 mL of free water Meets 100% estimated calorie and protein needs  Continue B-complex with C  NUTRITION DIAGNOSIS:   Inadequate oral intake related to inability to eat as evidenced by NPO status.  Being addressed via TF   GOAL:   Patient will meet greater than or equal to 90% of their needs  Progressing   MONITOR:   Diet advancement, Skin, Weight trends, Labs, I & O's, Other (Comment) (TPN tolerance)  REASON FOR ASSESSMENT:   Consult New TPN/TNA  ASSESSMENT:   69 y.o. female was recently hospitalized for COVID-19 pneumonia-and discharged on 2-3 L of home O2-presents with worsening shortness of breath-found to have A. fib with RVR, worsening hypoxia. Pt developed severe upper abdominal pain-subsequent further imaging studies revealed perforated viscus. S/p laparotomy on 11/3 which showed a perforated duodenal ulcer.  11/03Exploratory laparotomy, adhesiolysis, modified graham patch repair of perforated duodenal ulcer 11/05 TPN initiated 11/08 UGI negative for leak 11/09 Acute decline in respiratory and mental status requiring intubation 11/10 Started on trickle TF, CT abdomen negative for postop complication 33/35 TPN discontinued due to elevated LFTS, TGs; TF increased to 30 ml/hr, Started CRRT 11/16 Extubated, Re-intubated  Pt sedated on vent support; requiring fentanyl and versed; requiring levophed and vasopressin  Pt has been tolerating Pivot 1.5 at 65 ml/hr via Cortrak  CRRT held today due to access issues; new access placed, plan to resume  Current wt 88.6 kg; admit weight 99.7 kg. Net negative 3.5 L. Pt with 2-3+ edema; unsure of actual dry weight  JP drains with minimal output; ileostomy with 350 mL in 24 hours  Labs: CBGs 40-139 Meds: B-complex with C,  ss novolog, novolog q 4 hours, levemir   Diet Order:   Diet Order            Diet NPO time specified  Diet effective now                 EDUCATION NEEDS:   Not appropriate for education at this time  Skin:  Skin Assessment: Skin Integrity Issues: Skin Integrity Issues:: Stage I Stage I: sacrum Incisions: abdomen  Last BM:  11/17 via ileostomy  Height:   Ht Readings from Last 1 Encounters:  09/15/20 5\' 8"  (1.727 m)    Weight:   Wt Readings from Last 1 Encounters:  09/15/20 88.6 kg    BMI:  Body mass index is 29.7 kg/m.  Estimated Nutritional Needs:   Kcal:  4562-5638 kcals  Protein:  135-175 g  Fluid:  >/= 2 L/day   Kerman Passey MS, RDN, LDN, CNSC Registered Dietitian III Clinical Nutrition RD Pager and On-Call Pager Number Located in [place]

## 2020-09-15 NOTE — Procedures (Signed)
Central Venous Catheter Insertion Procedure Note  Candace [name]  [id number]  1951/07/17  Date:09/15/20  Time:1:31 PM   Provider Performing:Jasalyn Frysinger   Procedure: Insertion of Non-tunneled Central Venous Catheter(36556)with US guidance (21308)    Indication(s) Hemodialysis  Consent Risks of the procedure as well as the alternatives and risks of each were explained to the patient and/or caregiver.  Consent for the procedure was obtained and is signed in the bedside chart  Anesthesia Topical only with 1% lidocaine   Timeout Verified patient identification, verified procedure, site/side was marked, verified correct patient position, special equipment/implants available, medications/allergies/relevant history reviewed, required imaging and test results available.  Sterile Technique Maximal sterile technique including full sterile barrier drape, hand hygiene, sterile gown, sterile gloves, mask, hair covering, sterile ultrasound probe cover (if used).  Procedure Description Area of catheter insertion was cleaned with chlorhexidine and draped in sterile fashion.   With real-time ultrasound guidance a HD catheter was placed into the left subclavian vein.  Nonpulsatile blood flow and easy flushing noted in all ports.  The catheter was sutured in place and sterile dressing applied.  Complications/Tolerance None; patient tolerated the procedure well. Chest X-ray is ordered to verify placement for internal jugular or subclavian cannulation.  Chest x-ray is not ordered for femoral cannulation.  EBL Minimal  Specimen(s) None

## 2020-09-15 NOTE — Progress Notes (Signed)
[facility] Progress Note Patient Name: [name] DOB: 05/20/1951 MRN: [id number]   Date of Service  09/15/2020  HPI/Events of Note  Patient needs an ABG to assess oxygenation status.  eICU Interventions  ABG ordered.        Kerry Kass Kimetha Trulson 09/15/2020, 7:58 PM

## 2020-09-15 NOTE — Progress Notes (Signed)
Hypoglycemic Event  CBG: 63  Treatment: D50 50 mL (25 gm)  Symptoms: None  Follow-up CBG: Time:8:30 PM CBG Result:144  Possible Reasons for Event: Unknown    Candace [name]

## 2020-09-15 NOTE — Progress Notes (Signed)
Patient moved from room 3M10 to 3M07 on the ventilator with no problems.

## 2020-09-15 NOTE — Progress Notes (Signed)
Patient flipped back over to supine position with no problems. ET tube secured at 23 cm with ET tube holder.

## 2020-09-15 NOTE — Progress Notes (Signed)
NAMEBrinda [name], MRN:  [id number], DOB:  [birth year], LOS: 43 ADMISSION DATE:  08/31/2020, CONSULTATION DATE:  09/07/2020 REFERRING MD:  Dr Candiss Norse, CHIEF COMPLAINT:  Acute resp failure  Brief History   69 year old female who was previously diagnosed with Covid 08/23/2020.  Admitted 11/2 with AF-RVR, found to have a perforated duodenal underwent exploratory laparotomy with Phillip Heal patch placement.  History of present illness   69 year old black female that presented to the emergency room on 08/31/2020 for worsening of hypoxia and atrial fibrillation with RVR.  She was admitted to medical floor and placed on Cardizem infusion as well as Eliquis.  She then developed abdominal pain and was found to have a perforated duodenal ulcer for which she went to the operating room and had exploratory laparotomy, lysis of adhesions, Graham patch placement.  Onpostop day 6,developed bleeding from the surgical site.  Reported to a saturated dressing.  Patient was noted to have a hemoglobin change from 9-6.9.  During the same period time she had waxing waning blood pressure.  Developed hemorrhagic shock requiring pressors, blood transfusion and intubation  Past Medical History  Covid pneumonia Atrial fibrillation CKD stage III Diabetes mellitus Hypertension Colon cancer Hyperlipidemia  Significant Hospital Events   11/2 Admitted  11/3 OR -> perforated duodenal ulcer 11/10 progressive hemorrhagic shock, intubated, transfused, pressors, proned; started on CRRT in PM 11/16 Extubated. Re-intubated overnight due to respiratory distress and hypoxia with decreased mentation   Consults:  Cardiology [facility] Nephrology   Procedures:  11/3 Exploratory laparotomy, Phillip Heal patch, lysis of adhesion for duodenal ulceration postop day 6  R PICC 11/5 >> A line 11/9 >> ETT 11/9 > 11/16 ETT 11/16 >>  Lt Hickman CVL 11/9 >> R IJ trialysis >>   Significant Diagnostic Tests:  11/3 CT abd/ pelvis > 1. Positive for bowel  perforation: Pneumoperitoneum and intermediate density free fluid in the abdomen. Prior total colectomy. The specific site of perforation is unclear-oral contrast present to the proximal jejunum has not obviously leaked. Note that there may be small bowel loops adherent to the ventral abdominal wall along the greater curve of the stomach. 2. Extensive bilateral lower lung pneumonia. No pleural effusion. 3. Other abdominal and pelvic viscera are stable since 2015, including bilateral adrenal adenomas. Chronic renal parapelvic cysts. 4. Aortic Atherosclerosis 11/3 TTE > EF 70-75%, RV not well visualized, mildly reduced RV systolic function 84/66 CT chest/ abd/ pelvis> 1. Interval progression of diffuse bilateral hazy ground-glass airspace opacities with more focal areas of consolidation at the lung bases 2. Trace bilateral pleural effusions. 3. Postsurgical changes the abdomen as detailed above. No evidence for a postoperative abscess, however evaluation is limited by lack of IV contrast. 4. There is a 1.9 cm cystic appearing lesion located in the pancreatic body. This was not present on the patient's CT from 2015.  Follow-up with an outpatient contrast enhanced MRI is recommended. 5. The endometrial stripe appears diffusely thickened. Follow-up with pelvic ultrasound is recommended. Aortic Atherosclerosis 11/14 LE doppler studies > + DVT of right posterior tibial and peroneal vein, +dVT of left posterior tibial vein   Micro Data:  11/10 MRSA PCR > neg 11/10 BC x 2 > neg  Antimicrobials:  azithro 11/2 >11/3 Ceftriaxone 11/2  Fluconazole 11/3 Zosyn 11/3 >> 11/7 Vanc 11/10 Cefepime 11/10 > 11/16  Subjective:  Reintubated overnight due to decreased mentation, hypoxia, and respiratory distress.   Objective   Blood pressure (!) 82/64, pulse (!) 123, temperature 97.7 F (36.5 C), temperature source  Axillary, resp. rate (!) 28, height 5\' 8"  (1.727 m), weight 88.6 kg, SpO2 94 %. CVP:  [0  mmHg-3 mmHg] 3 mmHg  Vent Mode: PRVC FiO2 (%):  [100 %] 100 % Set Rate:  [30 bmp-34 bmp] 34 bmp Vt Set:  [380 mL-450 mL] 380 mL PEEP:  [5 cmH20-12 cmH20] 12 cmH20 Pressure Support:  [15 cmH20] 15 cmH20 Plateau Pressure:  [28 cmH20-31 cmH20] 28 cmH20   Intake/Output Summary (Last 24 hours) at 09/15/2020 0954 Last data filed at 09/15/2020 0900 Gross per 24 hour  Intake 2429.19 ml  Output 2105 ml  Net 324.19 ml   Filed Weights   09/13/20 0314 09/14/20 0500 09/15/20 0500  Weight: 88.8 kg 90.3 kg 88.6 kg    Examination: General:  Prone position. Adult female. Intubated.  HEENT: ETT in place  Neuro: Sedated. Does not follow commands. Pupils pin-point  CV: Tachy, s1 s2 , No RMG PULM: Coarse breath sounds, vent assisted breaths, no crackles/wheeze GI: unable to assess as patient is in prone position  Extremities: +1 BLE edema  Skin: no rash , no lesions, warm , dry and intact   Assessment & Plan:   Shock, multifactorial,  hemorrhagic and septic -S/P 7 Days of Cefepime  Plan: -follow hgb, transfus for hgb <7  -Maintain MAP >65. Currently on 29 mcg/min levophed and 0.03 vasopressin   ARDS COVID pneumonia,  possible secondary bacterial HCAP  -Failed Extubation 11/16 and re-intubated.  Plan  -Continue mechanical ventilation per ARDS protocol -Target TVof 6-8cc/kgIBW -Target Plateau Pressure < 30cm H20 -Target driving pressure less than 15 cm of water -Target PaO2 55-65: titrate PEEP/FiO2 per protocol -Continue to pull volume per CVVHD -Ventilator associated pneumonia prevention protocol -Wean Fentanyl/Versed for RASS goal -2/-3.   Acute blood loss anemia > Improved  Thrombocytopenia  Plan - post-op care per surgery  - Trend CBC daily - Transfuse for HGB < 7 - Send DIC panel   Bilateral LE DVT Plan - unable to start [facility] due to thrombocytopenia, discussed IVC filter placement with IR - monitor for hemodynamic change  Atrial fibrillation/RVR Plan  - holding ac due  to thrombocytopenia   AKI - oliguric likely ATN w/ hyperkalemia Plan - on cvvhd per nephrology - Trend BMET - treat and  replete electrolytes as needed  Duodenal perforation/ pneumoperitoneum   - S/pexploratory laparotomy, adhesiolysis, modified graham patch repair of perforated duodenal ulcer11/3; UGI neg for leak 11/8 Plan -Follow JP output -Postop care per general surgery, we appreciate their input.  Acute liver injury Transaminitis secondary to shock liver Plan: -Trend LFT >> down-trending   1.9 cm cystic appearing lesion located in the pancreatic body - found on CT 11/11 - outpatient follow up   Best practice:  Diet: Tube feeds Pain/Anxiety/Delirium protocol (if indicated) VAP protocol (if indicated): yes DVT prophylaxis: SCDs GI prophylaxis: Protonix BID Glucose control: SSI Mobility: Bedrest Code Status: Full Family Communication: Family updated on plan of care.  Disposition: ICU  Contact precautions -Patient has reached 21-day window however due to critical illness and progressive ARDS multiorgan failure state after discussion with infectious disease recommending continuation of contact precautions.  Labs   CBC: Recent Labs  Lab 09/12/20 0252 09/12/20 1916 09/13/20 0306 09/13/20 0306 09/14/20 0520 09/14/20 0851 09/14/20 1316 09/14/20 1618 09/15/20 0443  WBC 27.9*  --  19.5*  --  11.7*  --   --  5.5 7.6  NEUTROABS  --   --  16.9*  --   --   --   --   --   --  HGB 8.0*   < > 7.1*   < > 6.7* 7.8* 8.2* 9.4* 9.6*  HCT 24.4*   < > 21.9*   < > 21.1* 23.0* 24.0* 29.3* 30.1*  MCV 79.2*  --  79.9*  --  81.8  --   --  79.0* 81.8  PLT 39*  --  34*  --  35*  --   --  32* 26*   < > = values in this interval not displayed.    Basic Metabolic Panel: Recent Labs  Lab 09/11/20 0314 09/11/20 1646 09/12/20 0252 09/12/20 1706 09/13/20 0306 09/13/20 0306 09/13/20 1644 09/13/20 1644 09/14/20 0520 09/14/20 0851 09/14/20 1316 09/14/20 1618 09/15/20 0443   NA 134*  135   < > 136  136   < > 136   < > 134*   < > 138 139 137 137 137  K 4.0  4.0   < > 3.9  3.9   < > 3.8   < > 4.4   < > 4.2 4.0 4.4 4.3 4.8  CL 103  104   < > 104  105   < > 104  --  101  --  106  --   --  104 108  CO2 24  25   < > 26  26   < > 27  --  25  --  27  --   --  26 25  GLUCOSE 207*  206*   < > 147*  148*   < > 199*  --  270*  --  129*  --   --  102* 122*  BUN 31*  31*   < > 26*  26*   < > 27*  --  30*  --  30*  --   --  25* 24*  CREATININE 1.10*  1.05*   < > 0.90  0.90   < > 0.87  --  0.75  --  0.76  --   --  0.61 0.70  CALCIUM 8.2*  8.2*   < > 8.7*  8.7*   < > 8.4*  --  8.3*  --  8.3*  --   --  8.6* 8.4*  MG 2.5*  --  2.4  --  2.3  --   --   --  2.3  --   --   --  2.4  PHOS 3.0   < > 2.2*   < > 2.7  --  2.0*  --  2.0*  --   --  1.6* 3.0   < > = values in this interval not displayed.   GFR: Estimated Creatinine Clearance: 77.3 mL/min (by C-G formula based on SCr of 0.7 mg/dL). Recent Labs  Lab 09/08/20 1212 09/08/20 2218 09/09/20 0428 09/10/20 0329 09/11/20 0314 09/11/20 1004 09/12/20 0252 09/12/20 0252 09/13/20 0306 09/14/20 0520 09/14/20 1618 09/15/20 0443  PROCALCITON  --   --  12.02 10.16 5.90  --  5.29  --   --   --   --   --   WBC  --   --  23.7*  --   --    < > 27.9*   < > 19.5* 11.7* 5.5 7.6  LATICACIDVEN 4.1* 2.5*  --   --   --   --   --   --   --   --   --   --    < > = values in this interval not displayed.  Liver Function Tests: Recent Labs  Lab 09/11/20 0314 09/11/20 1646 09/12/20 0252 09/12/20 1706 09/13/20 0306 09/13/20 1644 09/14/20 0520 09/14/20 1618 09/15/20 0443  AST 1,002*  --  448*  --  232*  --  133*  --  127*  ALT 2,400*  --  1,542*  --  964*  --  616*  --  515*  ALKPHOS 236*  --  238*  --  237*  --  240*  --  287*  BILITOT 1.0  --  0.9  --  1.2  --  0.5  --  1.1  PROT 5.0*  --  4.9*  --  4.8*  --  4.8*  --  5.1*  ALBUMIN 1.9*  1.8*   < > 1.7*  1.7*   < > 1.5* 1.6* 1.5* 1.7* 1.4*   < > = values in this  interval not displayed.   No results for input(s): LIPASE, AMYLASE in the last 168 hours. No results for input(s): AMMONIA in the last 168 hours.  ABG    Component Value Date/Time   PHART 7.236 (L) 09/15/2020 0320   PCO2ART 59.9 (H) 09/15/2020 0320   PO2ART 62.8 (L) 09/15/2020 0320   HCO3 24.5 09/15/2020 0320   TCO2 29 09/14/2020 1316   ACIDBASEDEF 1.9 09/15/2020 0320   O2SAT 88.9 09/15/2020 0320     CRITICAL CARE Performed by: Omar Person   Total critical care time: 32 minutes  Critical care time was exclusive of separately billable procedures and treating other patients.  Critical care was necessary to treat or prevent imminent or life-threatening deterioration.  Critical care was time spent personally by me on the following activities: development of treatment plan with patient and/or surrogate as well as nursing, discussions with consultants, evaluation of patient's response to treatment, examination of patient, obtaining history from patient or surrogate, ordering and performing treatments and interventions, ordering and review of laboratory studies, ordering and review of radiographic studies, pulse oximetry and re-evaluation of patient's condition.  Hayden Pedro, AGACNP-BC [facility]  PCCM Pgr: [phone]

## 2020-09-15 NOTE — Progress Notes (Signed)
[facility] ROUNDING NOTE   Subjective:   Brief history 69 year old with history of diabetes hypertension hyperlipidemia colon cancer status post partial colectomy colostomy admitted 08/31/2020 with atrial fibrillation rapid ventricular rate Covid pneumonia found to have perforated duodenal ulcer underwent exploratory laparotomy developed hemorrhagic shock respiratory failure.  Creatinine 5.6 mg/dL on admission secondary to atrial fibrillation and hemodynamic instability.  CT scan did not reveal any evidence of hydronephrosis was started on CRRT 09/08/2020 for oliguric renal failure.  Blood pressure 82/60 pulse 105 temperature 97.7 O2 sats 94% FiO2 100%  IV vasopressin, IV norepinephrine  Kept even on CRRT oliguric/anuric  Sodium 137 potassium 4.8 chloride 108 CO2 25 BUN 24 creatinine 0.7 glucose 122 calcium 8.4 albumin 1.4.  Hemoglobin 9.6 platelets 26     Objective:  Vital signs in last 24 hours:  Temp:  [96.2 F (35.7 C)-97.7 F (36.5 C)] 97.7 F (36.5 C) (11/17 0400) Pulse Rate:  [82-122] 106 (11/17 0600) Resp:  [18-31] 26 (11/17 0600) BP: (74-159)/(60-87) 74/60 (11/17 0600) SpO2:  [77 %-100 %] 91 % (11/17 0600) Arterial Line BP: (76-169)/(45-72) 91/45 (11/17 0600) FiO2 (%):  [40 %-100 %] 100 % (11/17 0320) Weight:  [88.6 kg] 88.6 kg (11/17 0500)  Weight change: -1.7 kg Filed Weights   09/13/20 0314 09/14/20 0500 09/15/20 0500  Weight: 88.8 kg 90.3 kg 88.6 kg    Intake/Output: I/O last 3 completed shifts: In: 3355.8 [I.V.:497.7; Blood:350; Other:130; NG/GT:2078.1; IV Piggyback:300] Out: 5956 [Urine:60; Drains:220; LOVFI:4332; Stool:600]   Intake/Output this shift:  No intake/output data recorded.  General: Critically ill looking female intubated, sedated  Heart:RRR, s1s2 nl Lungs: Coarse breath sound posteriorly Abdomen:soft,  Extremities:No edema Dialysis Access: Right IJ temporary HD catheter placed by ICU on 11/10.   Basic Metabolic  Panel: Recent Labs  Lab 09/11/20 0314 09/11/20 1646 09/12/20 0252 09/12/20 1706 09/13/20 0306 09/13/20 0306 09/13/20 1644 09/13/20 1644 09/14/20 0520 09/14/20 0851 09/14/20 1316 09/14/20 1618 09/15/20 0443  NA 134*  135   < > 136  136   < > 136   < > 134*   < > 138 139 137 137 137  K 4.0  4.0   < > 3.9  3.9   < > 3.8   < > 4.4   < > 4.2 4.0 4.4 4.3 4.8  CL 103  104   < > 104  105   < > 104  --  101  --  106  --   --  104 108  CO2 24  25   < > 26  26   < > 27  --  25  --  27  --   --  26 25  GLUCOSE 207*  206*   < > 147*  148*   < > 199*  --  270*  --  129*  --   --  102* 122*  BUN 31*  31*   < > 26*  26*   < > 27*  --  30*  --  30*  --   --  25* 24*  CREATININE 1.10*  1.05*   < > 0.90  0.90   < > 0.87  --  0.75  --  0.76  --   --  0.61 0.70  CALCIUM 8.2*  8.2*   < > 8.7*  8.7*   < > 8.4*   < > 8.3*   < > 8.3*  --   --  8.6* 8.4*  MG 2.5*  --  2.4  --  2.3  --   --   --  2.3  --   --   --  2.4  PHOS 3.0   < > 2.2*   < > 2.7  --  2.0*  --  2.0*  --   --  1.6* 3.0   < > = values in this interval not displayed.    Liver Function Tests: Recent Labs  Lab 09/11/20 0314 09/11/20 1646 09/12/20 0252 09/12/20 1706 09/13/20 0306 09/13/20 1644 09/14/20 0520 09/14/20 1618 09/15/20 0443  AST 1,002*  --  448*  --  232*  --  133*  --  127*  ALT 2,400*  --  1,542*  --  964*  --  616*  --  515*  ALKPHOS 236*  --  238*  --  237*  --  240*  --  287*  BILITOT 1.0  --  0.9  --  1.2  --  0.5  --  1.1  PROT 5.0*  --  4.9*  --  4.8*  --  4.8*  --  5.1*  ALBUMIN 1.9*  1.8*   < > 1.7*  1.7*   < > 1.5* 1.6* 1.5* 1.7* 1.4*   < > = values in this interval not displayed.   No results for input(s): LIPASE, AMYLASE in the last 168 hours. No results for input(s): AMMONIA in the last 168 hours.  CBC: Recent Labs  Lab 09/12/20 0252 09/12/20 1916 09/13/20 0306 09/13/20 0306 09/14/20 0520 09/14/20 0851 09/14/20 1316 09/14/20 1618 09/15/20 0443  WBC 27.9*  --  19.5*  --   11.7*  --   --  5.5 7.6  NEUTROABS  --   --  16.9*  --   --   --   --   --   --   HGB 8.0*   < > 7.1*   < > 6.7* 7.8* 8.2* 9.4* 9.6*  HCT 24.4*   < > 21.9*   < > 21.1* 23.0* 24.0* 29.3* 30.1*  MCV 79.2*  --  79.9*  --  81.8  --   --  79.0* 81.8  PLT 39*  --  34*  --  35*  --   --  32* 26*   < > = values in this interval not displayed.    Cardiac Enzymes: No results for input(s): CKTOTAL, CKMB, CKMBINDEX, TROPONINI in the last 168 hours.  BNP: Invalid input(s): POCBNP  CBG: Recent Labs  Lab 09/14/20 2339 09/15/20 0000 09/15/20 0015 09/15/20 0332 09/15/20 0400  GLUCAP 65* 36* 128* 35* 139*    Microbiology: Results for orders placed or performed during the hospital encounter of 08/31/20  MRSA PCR Screening     Status: None   Collection Time: 09/08/20  8:17 AM   Specimen: Nasal Mucosa; Nasopharyngeal  Result Value Ref Range Status   MRSA by PCR NEGATIVE NEGATIVE Final    Comment:        The GeneXpert MRSA Assay (FDA approved for NASAL specimens only), is one component of a comprehensive MRSA colonization surveillance program. It is not intended to diagnose MRSA infection nor to guide or monitor treatment for MRSA infections. Performed at [facility], [place] [address] [place], [place] [address]   Culture, blood (Routine X 2) w Reflex to ID Panel     Status: None   Collection Time: 09/08/20  8:35 AM   Specimen: BLOOD LEFT HAND  Result Value Ref Range Status   Specimen Description  BLOOD LEFT HAND  Final   Special Requests   Final    BOTTLES DRAWN AEROBIC ONLY Blood Culture results may not be optimal due to an inadequate volume of blood received in culture bottles   Culture   Final    NO GROWTH 5 DAYS Performed at [facility], [place] [address] [place], [place] [address]    Report Status 09/13/2020 FINAL  Final  Culture, blood (Routine X 2) w Reflex to ID Panel     Status: None   Collection Time: 09/08/20  8:35 AM   Specimen: BLOOD RIGHT HAND  Result  Value Ref Range Status   Specimen Description BLOOD RIGHT HAND  Final   Special Requests   Final    BOTTLES DRAWN AEROBIC ONLY Blood Culture results may not be optimal due to an inadequate volume of blood received in culture bottles   Culture   Final    NO GROWTH 5 DAYS Performed at [facility], [place] [address] [place], [place] [address]    Report Status 09/13/2020 FINAL  Final    Coagulation Studies: Recent Labs    09/13/20 0438  LABPROT 13.0  INR 1.0    Urinalysis: No results for input(s): COLORURINE, LABSPEC, PHURINE, GLUCOSEU, HGBUR, BILIRUBINUR, KETONESUR, PROTEINUR, UROBILINOGEN, NITRITE, LEUKOCYTESUR in the last 72 hours.  Invalid input(s): APPERANCEUR    Imaging: DG CHEST PORT 1 VIEW  Result Date: 09/14/2020 CLINICAL DATA:  Respiratory failure EXAM: PORTABLE CHEST 1 VIEW COMPARISON:  09/13/2020 FINDINGS: Endotracheal tube 19 mm above the carina, nasoenteric feeding tube extending into the upper abdomen, right internal jugular central venous catheter tip within the superior vena cava, and right upper extremity PICC line with its tip within the superior vena cava are all unchanged. Extensive bibasilar pulmonary infiltrates have progressed in the interval, particularly at the left lung base, likely infectious or the sequela of aspiration. No pneumothorax or pleural effusion. IMPRESSION: Stable support lines and tubes. Progressive bibasilar pulmonary infiltrate, infection versus aspiration. Electronically Signed   By: Fidela Salisbury MD   On: 09/14/2020 23:17     Medications:   .  prismasol BGK 4/2.5 500 mL/hr at 09/14/20 1812  .  prismasol BGK 4/2.5 500 mL/hr at 09/15/20 0615  . cisatracurium (NIMBEX) infusion 2 mg/mL    . fentaNYL infusion INTRAVENOUS 125 mcg/hr (09/15/20 0600)  . midazolam 2 mg/hr (09/15/20 0600)  . norepinephrine (LEVOPHED) Adult infusion 18 mcg/min (09/15/20 0600)  . prismasol BGK 4/2.5 2,000 mL/hr at 09/15/20 0617  . vasopressin 0.03  Units/min (09/15/20 0600)   . artificial tears  1 application Both Eyes N3I  . B-complex with vitamin C  1 tablet Per Tube Daily  . bisacodyl  10 mg Rectal Once  . chlorhexidine gluconate (MEDLINE KIT)  15 mL Mouth Rinse BID  . Chlorhexidine Gluconate Cloth  6 each Topical Daily  . cisatracurium  0.05 mg/kg Intravenous Once  . docusate  100 mg Per Tube BID  . feeding supplement (PIVOT 1.5 CAL)  1,000 mL Per Tube Q24H  . insulin aspart  0-20 Units Subcutaneous Q4H  . insulin aspart  10 Units Subcutaneous Q4H  . insulin detemir  35 Units Subcutaneous BID  . mouth rinse  15 mL Mouth Rinse 10 times per day  . oxyCODONE  10 mg Per Tube Q6H  . pantoprazole sodium  40 mg Per Tube BID  . polyethylene glycol  17 g Per Tube Daily  . sodium chloride flush  10-40 mL Intracatheter Q12H  acetaminophen (TYLENOL) oral liquid 160 mg/5 mL, albuterol, diphenhydrAMINE, fentaNYL, heparin, heparin, phenol, silver nitrate applicators, sodium chloride, sodium chloride flush  Assessment/ Plan:  1.Acute kidney injury, oliguric likely ATN due to hemorrhagic shock: Patient initially had AKI with creatinine level of 5.6 on admission due to A. fib with RVR/hemodynamic instability which was improved. However she developed another episode of AKI after shock.  Urinalysis was contaminated/UTI. CT scan A/P without hydronephrosis. Started CRRT on 11/10 because of oliguria and hyperkalemia.  Tolerating well continues on pressors.  Dialysate and replacement fluids 4/2.5  2.Hyperkalemia due to AKI and intra-abdominal bleeding/PRBC transfusion: Now managed with CRRT.  3.Hypophosphatemia: Replete phosphate.  4.Hemorrhagic shock: Received blood transfusion.  Currently on Levophed and vasopressin.Maintain MAP >65.   5.Perforated duodenal ulcer status post ex lap: General surgery is following. Currently on tube feed.  6.ARDS/recent Covid pneumonia: Currently on mechanical ventilation, prone position. Per primary  team.   LOS: [place] @TODAY @7 :20 AM

## 2020-09-15 NOTE — Progress Notes (Signed)
CRRT stopped at 0720. RN unable to draw back blood from the red lumen. MD made aware.   Dr. Tacy Learn was also made aware that access lumen was instilled with 1.4 ml or 1400 units of heparin as a heparin lock. RN unable to draw the 1400 units of heparin from the lumen before giving the tPA/cath flow.   RN orders to flush the line with NS prior to instilling the the red lumen with 2mg  of tPA.

## 2020-09-16 DIAGNOSIS — J80 Acute respiratory distress syndrome: Secondary | ICD-10-CM

## 2020-09-16 DIAGNOSIS — U071 COVID-19: Secondary | ICD-10-CM | POA: Diagnosis not present

## 2020-09-16 LAB — POCT I-STAT 7, (LYTES, BLD GAS, ICA,H+H)
Acid-Base Excess: 1 mmol/L (ref 0.0–2.0)
Bicarbonate: 28.4 mmol/L — ABNORMAL HIGH (ref 20.0–28.0)
Calcium, Ion: 1.27 mmol/L (ref 1.15–1.40)
HCT: 25 % — ABNORMAL LOW (ref 36.0–46.0)
Hemoglobin: 8.5 g/dL — ABNORMAL LOW (ref 12.0–15.0)
O2 Saturation: 84 %
Patient temperature: 97.1
Potassium: 4.7 mmol/L (ref 3.5–5.1)
Sodium: 138 mmol/L (ref 135–145)
TCO2: 30 mmol/L (ref 22–32)
pCO2 arterial: 58.6 mmHg — ABNORMAL HIGH (ref 32.0–48.0)
pH, Arterial: 7.29 — ABNORMAL LOW (ref 7.350–7.450)
pO2, Arterial: 54 mmHg — ABNORMAL LOW (ref 83.0–108.0)

## 2020-09-16 LAB — MAGNESIUM: Magnesium: 2.5 mg/dL — ABNORMAL HIGH (ref 1.7–2.4)

## 2020-09-16 LAB — RENAL FUNCTION PANEL
Albumin: 1.3 g/dL — ABNORMAL LOW (ref 3.5–5.0)
Albumin: 1.3 g/dL — ABNORMAL LOW (ref 3.5–5.0)
Anion gap: 7 (ref 5–15)
Anion gap: 6 (ref 5–15)
BUN: 29 mg/dL — ABNORMAL HIGH (ref 8–23)
BUN: 28 mg/dL — ABNORMAL HIGH (ref 8–23)
CO2: 25 mmol/L (ref 22–32)
CO2: 26 mmol/L (ref 22–32)
Calcium: 8.4 mg/dL — ABNORMAL LOW (ref 8.9–10.3)
Calcium: 8.4 mg/dL — ABNORMAL LOW (ref 8.9–10.3)
Chloride: 105 mmol/L (ref 98–111)
Chloride: 106 mmol/L (ref 98–111)
Creatinine, Ser: 0.62 mg/dL (ref 0.44–1.00)
Creatinine, Ser: 0.61 mg/dL (ref 0.44–1.00)
GFR, Estimated: >60 mL/min (ref >60)
GFR, Estimated: >60 mL/min (ref >60)
Glucose, Bld: 148 mg/dL — ABNORMAL HIGH (ref 70–99)
Glucose, Bld: 147 mg/dL — ABNORMAL HIGH (ref 70–99)
Phosphorus: 2.8 mg/dL (ref 2.5–4.6)
Phosphorus: 3.3 mg/dL (ref 2.5–4.6)
Potassium: 4.6 mmol/L (ref 3.5–5.1)
Potassium: 4.9 mmol/L (ref 3.5–5.1)
Sodium: 137 mmol/L (ref 135–145)
Sodium: 138 mmol/L (ref 135–145)

## 2020-09-16 LAB — GLUCOSE, CAPILLARY
Glucose-Capillary: 117 mg/dL — ABNORMAL HIGH (ref 70–99)
Glucose-Capillary: 130 mg/dL — ABNORMAL HIGH (ref 70–99)
Glucose-Capillary: 133 mg/dL — ABNORMAL HIGH (ref 70–99)
Glucose-Capillary: 156 mg/dL — ABNORMAL HIGH (ref 70–99)
Glucose-Capillary: 160 mg/dL — ABNORMAL HIGH (ref 70–99)
Glucose-Capillary: 160 mg/dL — ABNORMAL HIGH (ref 70–99)

## 2020-09-16 LAB — CBC
HCT: 25.9 % — ABNORMAL LOW (ref 36.0–46.0)
Hemoglobin: 8.1 g/dL — ABNORMAL LOW (ref 12.0–15.0)
MCH: 26.3 pg (ref 26.0–34.0)
MCHC: 31.3 g/dL (ref 30.0–36.0)
MCV: 84.1 fL (ref 80.0–100.0)
Platelets: 27 10*3/uL — CL (ref 150–400)
RBC: 3.08 MIL/uL — ABNORMAL LOW (ref 3.87–5.11)
RDW: 20.4 % — ABNORMAL HIGH (ref 11.5–15.5)
WBC: 19.5 10*3/uL — ABNORMAL HIGH (ref 4.0–10.5)
nRBC: 9.8 % — ABNORMAL HIGH (ref 0.0–0.2)

## 2020-09-16 MED ORDER — HYDROCORTISONE NA SUCCINATE PF 100 MG IJ SOLR
50.0000 mg | Freq: Four times a day (QID) | INTRAMUSCULAR | Status: DC
  Administered 2020-09-16 – 2020-09-17 (×4): 50 mg via INTRAVENOUS
  Filled 2020-09-16 (×4): qty 2

## 2020-09-16 MED ORDER — INSULIN DETEMIR 100 UNIT/ML ~~LOC~~ SOLN
35.0000 [IU] | Freq: Every day | SUBCUTANEOUS | Status: DC
  Administered 2020-09-16 – 2020-09-20 (×5): 35 [IU] via SUBCUTANEOUS
  Filled 2020-09-16 (×6): qty 0.35

## 2020-09-16 MED ORDER — INSULIN DETEMIR 100 UNIT/ML ~~LOC~~ SOLN
25.0000 [IU] | Freq: Every day | SUBCUTANEOUS | Status: DC
  Administered 2020-09-16 – 2020-09-20 (×5): 25 [IU] via SUBCUTANEOUS
  Filled 2020-09-16 (×6): qty 0.25

## 2020-09-16 NOTE — Progress Notes (Signed)
NAMECiarah [name], MRN:  [id number], DOB:  Nov 04, 1950, LOS: 80 ADMISSION DATE:  08/31/2020, CONSULTATION DATE:  09/07/2020 REFERRING MD:  Dr Candiss Norse, CHIEF COMPLAINT:  Acute resp failure  Brief History   69 year old female who was previously diagnosed with Covid 08/23/2020.  Admitted 11/2 with AF-RVR, found to have a perforated duodenal underwent exploratory laparotomy with Phillip Heal patch placement.  Past Medical History  Covid pneumonia Atrial fibrillation CKD stage III Diabetes mellitus Hypertension Colon cancer Hyperlipidemia  Significant Hospital Events   11/2 Admitted  11/3 OR -> perforated duodenal ulcer 11/10 progressive hemorrhagic shock, intubated, transfused, pressors, proned; started on CRRT in PM 11/16 Extubated. Re-intubated overnight due to respiratory distress and hypoxia with decreased mentation   Consults:  Cardiology [facility]phrology   Procedures:  11/3 Exploratory laparotomy, Phillip Heal patch, lysis of adhesion for duodenal ulceration postop day 6  R PICC 11/5 >> A line 11/9 >> ETT 11/9 > 11/16 ETT 11/16 >>  Lt [place] CVL 11/9 >> R IJ trialysis >>   Significant Diagnostic Tests:  11/3 CT abd/ pelvis > 1. Positive for bowel perforation: Pneumoperitoneum and intermediate density free fluid in the abdomen. Prior total colectomy. The specific site of perforation is unclear-oral contrast present to the proximal jejunum has not obviously leaked. Note that there may be small bowel loops adherent to the ventral abdominal wall along the greater curve of the stomach. 2. Extensive bilateral lower lung pneumonia. No pleural effusion. 3. Other abdominal and pelvic viscera are stable since 2015, including bilateral adrenal adenomas. Chronic renal parapelvic cysts. 4. Aortic Atherosclerosis 11/3 TTE > EF 70-75%, RV not well visualized, mildly reduced RV systolic function 45/36 CT chest/ abd/ pelvis> 1. Interval progression of diffuse bilateral hazy ground-glass airspace  opacities with more focal areas of consolidation at the lung bases 2. Trace bilateral pleural effusions. 3. Postsurgical changes the abdomen as detailed above. No evidence for a postoperative abscess, however evaluation is limited by lack of IV contrast. 4. There is a 1.9 cm cystic appearing lesion located in the pancreatic body. This was not present on the patient's CT from 2015.  Follow-up with an outpatient contrast enhanced MRI is recommended. 5. The endometrial stripe appears diffusely thickened. Follow-up with pelvic ultrasound is recommended. Aortic Atherosclerosis 11/14 LE doppler studies > + DVT of right posterior tibial and peroneal vein, +dVT of left posterior tibial vein   Micro Data:  11/10 MRSA PCR > neg 11/10 BC x 2 > neg  Antimicrobials:  azithro 11/2 >11/3 Ceftriaxone 11/2  Fluconazole 11/3 Zosyn 11/3 >> 11/7 Vanc 11/10 Cefepime 11/10 > 11/16  Subjective:  Patient's white count continue to trend up, she is hypothermic and hypotensive requiring multiple vasopressors.  X-ray chest is suggestive of worsening bilateral lower lobe infiltrates.  Objective   Blood pressure 120/65, pulse 93, temperature (!) 97.1 F (36.2 C), temperature source Axillary, resp. rate (!) 32, height _0  (1.727 m), weight 88.4 kg, SpO2 100 %. CVP:  [0 mmHg-2 mmHg] 2 mmHg  Vent Mode: PRVC FiO2 (%):  [60 %-100 %] 60 % Set Rate:  [34 bmp] 34 bmp Vt Set:  [380 mL-450 mL] 400 mL PEEP:  [12 cmH20] 12 cmH20 Plateau Pressure:  [27 cmH20-37 cmH20] 30 cmH20   Intake/Output Summary (Last 24 hours) at 09/16/2020 1204 Last data filed at 09/16/2020 1100 Gross per 24 hour  Intake 3571.54 ml  Output 3660 ml  Net -88.46 ml   Filed Weights   09/14/20 0500 09/15/20 0500 09/16/20 0428  Weight:  90.3 kg 88.6 kg 88.4 kg    Examination: General: African-American female, lying in the bed, orally intubated HEENT: Atraumatic, normocephalic, moist mucous membranes.  ETT and OGT in place Neuro: Sedated, not  following commands. Pupils pin-point  CV: Tachycardic, regular rhythm, no murmur appreciated PULM: Coarse breath sounds, vent assisted breaths, no crackles/wheeze GI: unable to assess as patient is in prone position  Extremities: +1 BLE edema  Skin: no rash , no lesions, warm , dry and intact   Assessment & Plan:  Acute hypoxic/hypercapnic respiratory failure due to ARDS from COVID-19 pneumonia Multifactorial shock, previously hemorrhagic and now likely septic Healthcare associated pneumonia Acute blood loss anemia Anuric AKI on CRRT Duodenal ulcer perforation status post ex lap and Graham's patch Paroxysmal A. fib, currently in sinus rhythm Acute hepatitis likely due to shock liver, improving Thrombocytopenia Questionable bilateral lower extremity DVTs Recurrent hypoglycemia at night 1.9 cm cystic appearing lesion located in the pancreatic body  Continue lung protective ventilation, slowly we are coming down on vent setting Patient failed extubation 2 days before, had to be reintubated Spoke with patient's daughter, she is agreeable for tracheostomy by Monday Patient x-ray chest shows worsening bilateral infiltrates Bronchoscopy and BAL was performed and sample was sent for culture Patient received total of 7 days of cefepime which was discontinued 2 days ago Currently her white count is going up We will follow respiratory culture, currently we will keep her off antibiotics Patient continued to require vasopressors, currently on Levophed and vasopressin Titrate vasopressors with map goal of 65 Monitor H&H, currently stable no signs of active bleeding anymore Patient is on CRRT, her electrolytes and bicarbonate are within normal limit but she is volume overloaded We will try to take some fluid out General surgery is following Patient remained in sinus rhythm, cannot be anticoagulated first secondary stroke prophylaxis due to her persistent thrombocytopenia Patient had bilateral  lower extremity Dopplers done, suggestive of possible indeterminate DVT, spoke with IR for IVC filter placement, after reevaluating ultrasound of lower extremities it was suggested patient does not have femoral/popliteal DVTs, so IVC filter was not placed Patient will follow as an outpatient for cystic pancreatic lesion of 1.9 cm LFTs are improving Patient gets hypoglycemic at night quite frequently, decrease Lantus at night to 25 and continue 35 units in the morning, monitor fingerstick closely  Best practice:  Diet: Tube feeds Pain/Anxiety/Delirium protocol (if indicated) midazolam fentanyl VAP protocol (if indicated): yes DVT prophylaxis: SCDs GI prophylaxis: Protonix BID Glucose control: Basal and bolus Mobility: Bedrest Code Status: Full Family Communication: Family updated on plan of care.  Disposition: ICU   Labs   CBC: Recent Labs  Lab 09/13/20 0306 09/13/20 0306 09/14/20 0520 09/14/20 0851 09/14/20 1618 09/15/20 0443 09/15/20 1119 09/15/20 1725 09/16/20 0412 09/16/20 0922  WBC 19.5*  --  11.7*  --  5.5 7.6  --   --  19.5*  --   NEUTROABS 16.9*  --   --   --   --   --   --   --   --   --   HGB 7.1*   < > 6.7*   < > 9.4* 9.6*  --  8.5* 8.1* 8.5*  HCT 21.9*   < > 21.1*   < > 29.3* 30.1*  --  25.0* 25.9* 25.0*  MCV 79.9*  --  81.8  --  79.0* 81.8  --   --  84.1  --   PLT 34*   < > 35*  --  32* 26* 27*  --  27*  --    < > = values in this interval not displayed.    Basic Metabolic Panel: Recent Labs  Lab 09/12/20 0252 09/12/20 1706 09/13/20 0306 09/13/20 1644 09/14/20 0520 09/14/20 0851 09/14/20 1618 09/14/20 1618 09/15/20 0443 09/15/20 1552 09/15/20 1725 09/16/20 0412 09/16/20 0922  NA 136  136   < > 136   < > 138   < > 137   < > 137 138 138 137 138  K 3.9  3.9   < > 3.8   < > 4.2   < > 4.3   < > 4.8 5.0 4.9 4.6 4.7  CL 104  105   < > 104   < > 106  --  104  --  108 109  --  105  --   CO2 26  26   < > 27   < > 27  --  26  --  25 24  --  25  --    GLUCOSE 147*  148*   < > 199*   < > 129*  --  102*  --  122* 138*  --  148*  --   BUN 26*  26*   < > 27*   < > 30*  --  25*  --  24* 38*  --  29*  --   CREATININE 0.90  0.90   < > 0.87   < > 0.76  --  0.61  --  0.70 0.93  --  0.62  --   CALCIUM 8.7*  8.7*   < > 8.4*   < > 8.3*  --  8.6*  --  8.4* 8.2*  --  8.4*  --   MG 2.4  --  2.3  --  2.3  --   --   --  2.4  --   --  2.5*  --   PHOS 2.2*   < > 2.7   < > 2.0*  --  1.6*  --  3.0 4.1  --  2.8  --    < > = values in this interval not displayed.   GFR: Estimated Creatinine Clearance: 77.2 mL/min (by C-G formula based on SCr of 0.62 mg/dL). Recent Labs  Lab 09/10/20 0329 09/11/20 0314 09/11/20 1004 09/12/20 0252 09/13/20 0306 09/14/20 0520 09/14/20 1618 09/15/20 0443 09/16/20 0412  PROCALCITON 10.16 5.90  --  5.29  --   --   --   --   --   WBC  --   --    < > 27.9*   < > 11.7* 5.5 7.6 19.5*   < > = values in this interval not displayed.    Liver Function Tests: Recent Labs  Lab 09/11/20 0314 09/11/20 1646 09/12/20 0252 09/12/20 1706 09/13/20 0306 09/13/20 1644 09/14/20 0520 09/14/20 1618 09/15/20 0443 09/15/20 1552 09/16/20 0412  AST 1,002*  --  448*  --  232*  --  133*  --  127*  --   --   ALT 2,400*  --  1,542*  --  964*  --  616*  --  515*  --   --   ALKPHOS 236*  --  238*  --  237*  --  240*  --  287*  --   --   BILITOT 1.0  --  0.9  --  1.2  --  0.5  --  1.1  --   --  PROT 5.0*  --  4.9*  --  4.8*  --  4.8*  --  5.1*  --   --   ALBUMIN 1.9*  1.8*   < > 1.7*  1.7*   < > 1.5*   < > 1.5* 1.7* 1.4* 1.2* 1.3*   < > = values in this interval not displayed.   No results for input(s): LIPASE, AMYLASE in the last 168 hours. No results for input(s): AMMONIA in the last 168 hours.  ABG    Component Value Date/Time   PHART 7.290 (L) 09/16/2020 0922   PCO2ART 58.6 (H) 09/16/2020 0922   PO2ART 54 (L) 09/16/2020 0922   HCO3 28.4 (H) 09/16/2020 0922   TCO2 30 09/16/2020 0922   ACIDBASEDEF 3.2 (H) 09/15/2020 2043    O2SAT 84.0 09/16/2020 0922     Total critical care time: 36 minutes  Performed by: Brooks care time was exclusive of separately billable procedures and treating other patients.   Critical care was necessary to treat or prevent imminent or life-threatening deterioration.   Critical care was time spent personally by me on the following activities: development of treatment plan with patient and/or surrogate as well as nursing, discussions with consultants, evaluation of patient's response to treatment, examination of patient, obtaining history from patient or surrogate, ordering and performing treatments and interventions, ordering and review of laboratory studies, ordering and review of radiographic studies, pulse oximetry and re-evaluation of patient's condition.   Jacky Kindle MD Critical care physician [facility]  Pager: [phone] Mobile: [phone]

## 2020-09-16 NOTE — Procedures (Signed)
Bedside Bronchoscopy Procedure Note Candace [name] [id number] 1951/01/18  Procedure: Bronchoscopy Indications: Diagnostic evaluation of the airways and Obtain specimens for culture and/or other diagnostic studies  Procedure Details: ET Tube Size: 7.5 ET Tube secured at lip (cm): 23 Bite block in place: No In preparation for procedure, Patient hyper-oxygenated with 100 % FiO2 and Saline given via ETT (30 ml) for BAL Airway entered and the following bronchi were examined: RUL, RML, RLL, LUL, LLL and Bronchi.   Bronchoscope removed. Patient placed back on 60% FiO2 at conclusion of procedure.    Evaluation BP 120/66   Pulse 80   Temp (!) 97.1 F (36.2 C) (Axillary)   Resp (!) 30   Ht _0  (1.727 m)   Wt 88.4 kg   SpO2 95%   BMI 29.63 kg/m  Breath Sounds:Diminished O2 sats: stable throughout Patient's Current Condition: stable Specimens:  BAL Complications: No apparent complications Patient did tolerate procedure well.   [name] 09/16/2020, 11:01 AM

## 2020-09-16 NOTE — Progress Notes (Signed)
[facility] ROUNDING NOTE   Subjective:   Brief history 69 year old with history of diabetes hypertension hyperlipidemia colon cancer status post partial colectomy colostomy admitted 08/31/2020 with atrial fibrillation rapid ventricular rate Covid pneumonia found to have perforated duodenal ulcer underwent exploratory laparotomy developed hemorrhagic shock respiratory failure.  Creatinine 5.6 mg/dL on admission secondary to atrial fibrillation and hemodynamic instability.  CT scan did not reveal any evidence of hydronephrosis was started on CRRT 09/08/2020 for oliguric renal failure.  Blood pressure 103/50 pulse 82 temperature 97.5 O2 sats 1% FiO2 60%  IV vasopressin, IV norepinephrine  Kept even on CRRT oliguric/anuric  Sodium 137 potassium 4.6 chloride 105 CO2 25 BUN 29 creatinine 0.62 calcium 8.4 hemoglobin 8.1 WBC 19.5     Objective:  Vital signs in last 24 hours:  Temp:  [95.9 F (35.5 C)-97.5 F (36.4 C)] 97.5 F (36.4 C) (11/18 0313) Pulse Rate:  [76-131] 79 (11/18 0700) Resp:  [15-39] 34 (11/18 0700) BP: (93-120)/(48-83) 114/62 (11/18 0700) SpO2:  [93 %-100 %] 100 % (11/18 0700) Arterial Line BP: (94-165)/(40-83) 99/49 (11/18 0700) FiO2 (%):  [60 %-100 %] 60 % (11/18 0435) Weight:  [88.4 kg] 88.4 kg (11/18 0428)  Weight change: -0.2 kg Filed Weights   09/14/20 0500 09/15/20 0500 09/16/20 0428  Weight: 90.3 kg 88.6 kg 88.4 kg    Intake/Output: I/O last 3 completed shifts: In: 4819.2 [I.V.:2156.2; Other:20; GQ/QP:6195; IV Piggyback:100] Out: 0932 [Urine:10; Drains:40; Other:2620; IZTIW:5809]   Intake/Output this shift:  No intake/output data recorded.  General: Critically ill looking female intubated, sedated  Heart:RRR, s1s2 nl Lungs: Coarse breath sound posteriorly Abdomen:soft,  Extremities:No edema Dialysis Access: Right IJ temporary HD catheter placed by ICU on 11/10.   Basic Metabolic Panel: Recent Labs  Lab 09/12/20 0252  09/12/20 1706 09/13/20 0306 09/13/20 1644 09/14/20 0520 09/14/20 0851 09/14/20 1618 09/14/20 1618 09/15/20 0443 09/15/20 1552 09/15/20 1725 09/16/20 0412  NA 136  136   < > 136   < > 138   < > 137  --  137 138 138 137  K 3.9  3.9   < > 3.8   < > 4.2   < > 4.3  --  4.8 5.0 4.9 4.6  CL 104  105   < > 104   < > 106  --  104  --  108 109  --  105  CO2 26  26   < > 27   < > 27  --  26  --  25 24  --  25  GLUCOSE 147*  148*   < > 199*   < > 129*  --  102*  --  122* 138*  --  148*  BUN 26*  26*   < > 27*   < > 30*  --  25*  --  24* 38*  --  29*  CREATININE 0.90  0.90   < > 0.87   < > 0.76  --  0.61  --  0.70 0.93  --  0.62  CALCIUM 8.7*  8.7*   < > 8.4*   < > 8.3*   < > 8.6*   < > 8.4* 8.2*  --  8.4*  MG 2.4  --  2.3  --  2.3  --   --   --  2.4  --   --  2.5*  PHOS 2.2*   < > 2.7   < > 2.0*  --  1.6*  --  3.0  4.1  --  2.8   < > = values in this interval not displayed.    Liver Function Tests: Recent Labs  Lab 09/11/20 0314 09/11/20 1646 09/12/20 0252 09/12/20 1706 09/13/20 0306 09/13/20 1644 09/14/20 0520 09/14/20 1618 09/15/20 0443 09/15/20 1552 09/16/20 0412  AST 1,002*  --  448*  --  232*  --  133*  --  127*  --   --   ALT 2,400*  --  1,542*  --  964*  --  616*  --  515*  --   --   ALKPHOS 236*  --  238*  --  237*  --  240*  --  287*  --   --   BILITOT 1.0  --  0.9  --  1.2  --  0.5  --  1.1  --   --   PROT 5.0*  --  4.9*  --  4.8*  --  4.8*  --  5.1*  --   --   ALBUMIN 1.9*  1.8*   < > 1.7*  1.7*   < > 1.5*   < > 1.5* 1.7* 1.4* 1.2* 1.3*   < > = values in this interval not displayed.   No results for input(s): LIPASE, AMYLASE in the last 168 hours. No results for input(s): AMMONIA in the last 168 hours.  CBC: Recent Labs  Lab 09/13/20 0306 09/13/20 0306 09/14/20 0520 09/14/20 0851 09/14/20 1316 09/14/20 1618 09/15/20 0443 09/15/20 1119 09/15/20 1725 09/16/20 0412  WBC 19.5*  --  11.7*  --   --  5.5 7.6  --   --  19.5*  NEUTROABS 16.9*  --   --    --   --   --   --   --   --   --   HGB 7.1*   < > 6.7*   < > 8.2* 9.4* 9.6*  --  8.5* 8.1*  HCT 21.9*   < > 21.1*   < > 24.0* 29.3* 30.1*  --  25.0* 25.9*  MCV 79.9*  --  81.8  --   --  79.0* 81.8  --   --  84.1  PLT 34*   < > 35*  --   --  32* 26* 27*  --  27*   < > = values in this interval not displayed.    Cardiac Enzymes: No results for input(s): CKTOTAL, CKMB, CKMBINDEX, TROPONINI in the last 168 hours.  BNP: Invalid input(s): POCBNP  CBG: Recent Labs  Lab 09/15/20 1953 09/15/20 2028 09/15/20 2136 09/15/20 2305 09/16/20 0309  GLUCAP 63* 141* 59 119* 156*    Microbiology: Results for orders placed or performed during the hospital encounter of 08/31/20  MRSA PCR Screening     Status: None   Collection Time: 09/08/20  8:17 AM   Specimen: Nasal Mucosa; Nasopharyngeal  Result Value Ref Range Status   MRSA by PCR NEGATIVE NEGATIVE Final    Comment:        The GeneXpert MRSA Assay (FDA approved for NASAL specimens only), is one component of a comprehensive MRSA colonization surveillance program. It is not intended to diagnose MRSA infection nor to guide or monitor treatment for MRSA infections. Performed at [facility], [place] [address] [place], [place] [address]   Culture, blood (Routine X 2) w Reflex to ID Panel     Status: None   Collection Time: 09/08/20  8:35 AM   Specimen: BLOOD LEFT HAND  Result Value Ref Range Status   Specimen Description BLOOD LEFT HAND  Final   Special Requests   Final    BOTTLES DRAWN AEROBIC ONLY Blood Culture results may not be optimal due to an inadequate volume of blood received in culture bottles   Culture   Final    NO GROWTH 5 DAYS Performed at [facility] [address], [place], [place] [address]    Report Status 09/13/2020 FINAL  Final  Culture, blood (Routine X 2) w Reflex to ID Panel     Status: None   Collection Time: 09/08/20  8:35 AM   Specimen: BLOOD RIGHT HAND  Result Value Ref Range Status    Specimen Description BLOOD RIGHT HAND  Final   Special Requests   Final    BOTTLES DRAWN AEROBIC ONLY Blood Culture results may not be optimal due to an inadequate volume of blood received in culture bottles   Culture   Final    NO GROWTH 5 DAYS Performed at [facility], [place] [address] [place], [place] [address]    Report Status 09/13/2020 FINAL  Final    Coagulation Studies: Recent Labs    09/15/20 1119  LABPROT 14.4  INR 1.2    Urinalysis: No results for input(s): COLORURINE, LABSPEC, PHURINE, GLUCOSEU, HGBUR, BILIRUBINUR, KETONESUR, PROTEINUR, UROBILINOGEN, NITRITE, LEUKOCYTESUR in the last 72 hours.  Invalid input(s): APPERANCEUR    Imaging: DG CHEST PORT 1 VIEW  Result Date: 09/15/2020 CLINICAL DATA:  Respiratory distress EXAM: PORTABLE CHEST 1 VIEW FINDINGS: Interval placement of a LEFT central venous line tip in distal SVC. RIGHT central venous line unchanged. Endotracheal tube and PICC line unchanged. A feeding tube extend into the stomach. There is bibasilar dense airspace disease unchanged. No pneumothorax IMPRESSION: 1. Placement of central venous line without complication. 2. Stable support apparatus otherwise. 3. Dense bibasilar airspace disease. Electronically Signed   By: Suzy Bouchard M.D.   On: 09/15/2020 13:30   DG CHEST PORT 1 VIEW  Result Date: 09/14/2020 CLINICAL DATA:  Respiratory failure EXAM: PORTABLE CHEST 1 VIEW COMPARISON:  09/13/2020 FINDINGS: Endotracheal tube 19 mm above the carina, nasoenteric feeding tube extending into the upper abdomen, right internal jugular central venous catheter tip within the superior vena cava, and right upper extremity PICC line with its tip within the superior vena cava are all unchanged. Extensive bibasilar pulmonary infiltrates have progressed in the interval, particularly at the left lung base, likely infectious or the sequela of aspiration. No pneumothorax or pleural effusion. IMPRESSION: Stable support lines  and tubes. Progressive bibasilar pulmonary infiltrate, infection versus aspiration. Electronically Signed   By: Fidela Salisbury MD   On: 09/14/2020 23:17     Medications:   .  prismasol BGK 4/2.5 500 mL/hr at 09/16/20 0008  .  prismasol BGK 4/2.5 500 mL/hr at 09/16/20 0143  . sodium chloride 10 mL/hr at 09/16/20 0700  . fentaNYL infusion INTRAVENOUS 400 mcg/hr (09/16/20 0700)  . midazolam 5 mg/hr (09/16/20 0705)  . norepinephrine (LEVOPHED) Adult infusion 33 mcg/min (09/16/20 0700)  . prismasol BGK 4/2.5 2,000 mL/hr at 09/16/20 0505  . vasopressin 0.03 Units/min (09/16/20 0700)   . artificial tears  1 application Both Eyes P2R  . B-complex with vitamin C  1 tablet Per Tube Daily  . chlorhexidine gluconate (MEDLINE KIT)  15 mL Mouth Rinse BID  . Chlorhexidine Gluconate Cloth  6 each Topical Daily  . chlorpheniramine-HYDROcodone  5 mL Per Tube Q12H  . docusate  100  mg Per Tube BID  . feeding supplement (PIVOT 1.5 CAL)  1,000 mL Per Tube Q24H  . feeding supplement (PROSource TF)  45 mL Per Tube BID  . insulin aspart  0-20 Units Subcutaneous Q4H  . insulin aspart  10 Units Subcutaneous Q4H  . insulin detemir  35 Units Subcutaneous BID  . mouth rinse  15 mL Mouth Rinse 10 times per day  . oxyCODONE  10 mg Per Tube Q6H  . pantoprazole sodium  40 mg Per Tube BID  . polyethylene glycol  17 g Per Tube Daily  . sodium chloride flush  10-40 mL Intracatheter Q12H   sodium chloride, acetaminophen (TYLENOL) oral liquid 160 mg/5 mL, albuterol, diphenhydrAMINE, fentaNYL, heparin, heparin, midazolam, phenol, silver nitrate applicators, sodium chloride, sodium chloride flush  Assessment/ Plan:  1.Acute kidney injury, oliguric likely ATN due to hemorrhagic shock: Patient initially had AKI with creatinine level of 5.6 on admission due to A. fib with RVR/hemodynamic instability which was improved. However she developed another episode of AKI after shock.  Urinalysis was contaminated/UTI. CT scan A/P  without hydronephrosis. Started CRRT on 11/10 because of oliguria and hyperkalemia.  Tolerating well continues on pressors.  Dialysate and replacement fluids 4/2.5.  No change to current prescription  2.Hyperkalemia due to AKI and intra-abdominal bleeding/PRBC transfusion: Now managed with CRRT.  3.Hypophosphatemia: Replete phosphate as needed  4.Hemorrhagic shock: Received blood transfusion.  Currently on Levophed and vasopressin.Maintain MAP >65.   5.Perforated duodenal ulcer status post ex lap: General surgery is following. Currently on tube feed.  6.ARDS/recent Covid pneumonia: Currently on mechanical ventilation, prone position. Per primary team.   LOS: [place] @TODAY @7 :34 AM

## 2020-09-16 NOTE — Progress Notes (Signed)
Bedside bronchoscopy with Dr. Tacy Learn. Consent obtained from daughter. Timeout was performed with MD, RT, and this RN.

## 2020-09-16 NOTE — Procedures (Signed)
Bronchoscopy Procedure Note  Candace [name]  [id number]  11/16/1950  Date:09/16/20  Time:10:58 AM   Provider Performing:Lothar Prehn   Procedure(s):  Flexible bronchoscopy with bronchial alveolar lavage (35361)  Indication(s) B/l lower PNA and respiratory failure  Consent Risks of the procedure as well as the alternatives and risks of each were explained to the patient and/or caregiver.  Consent for the procedure was obtained and is signed in the bedside chart  Anesthesia Versed and fentanyl   Time Out Verified patient identification, verified procedure, site/side was marked, verified correct patient position, special equipment/implants available, medications/allergies/relevant history reviewed, required imaging and test results available.   Sterile Technique Usual hand hygiene, masks, gowns, and gloves were used   Procedure Description Bronchoscope advanced through endotracheal tube and into airway.  Airways were examined down to subsegmental level with findings noted below.   Following diagnostic evaluation, BAL(s) performed in 30 cc with normal saline and return of 15cc fluid  Findings: Scant amount of thick tan secretions, otherwise normal mucosa   Complications/Tolerance None; patient tolerated the procedure well. Chest X-ray is not needed post procedure.   EBL Minimal   Specimen(s) Sent to lab

## 2020-09-17 DIAGNOSIS — A419 Sepsis, unspecified organism: Secondary | ICD-10-CM

## 2020-09-17 DIAGNOSIS — J9601 Acute respiratory failure with hypoxia: Secondary | ICD-10-CM

## 2020-09-17 DIAGNOSIS — R6521 Severe sepsis with septic shock: Secondary | ICD-10-CM | POA: Diagnosis not present

## 2020-09-17 DIAGNOSIS — J9600 Acute respiratory failure, unspecified whether with hypoxia or hypercapnia: Secondary | ICD-10-CM

## 2020-09-17 DIAGNOSIS — U071 COVID-19: Secondary | ICD-10-CM | POA: Diagnosis not present

## 2020-09-17 DIAGNOSIS — J80 Acute respiratory distress syndrome: Secondary | ICD-10-CM

## 2020-09-17 LAB — POCT I-STAT 7, (LYTES, BLD GAS, ICA,H+H)
Acid-Base Excess: 1 mmol/L (ref 0.0–2.0)
Bicarbonate: 28 mmol/L (ref 20.0–28.0)
Calcium, Ion: 1.33 mmol/L (ref 1.15–1.40)
HCT: 21 % — ABNORMAL LOW (ref 36.0–46.0)
Hemoglobin: 7.1 g/dL — ABNORMAL LOW (ref 12.0–15.0)
O2 Saturation: 89 %
Patient temperature: 98
Potassium: 4.4 mmol/L (ref 3.5–5.1)
Sodium: 138 mmol/L (ref 135–145)
TCO2: 30 mmol/L (ref 22–32)
pCO2 arterial: 56.1 mmHg — ABNORMAL HIGH (ref 32.0–48.0)
pH, Arterial: 7.304 — ABNORMAL LOW (ref 7.350–7.450)
pO2, Arterial: 62 mmHg — ABNORMAL LOW (ref 83.0–108.0)

## 2020-09-17 LAB — CBC
HCT: 22.3 % — ABNORMAL LOW (ref 36.0–46.0)
HCT: 29 % — ABNORMAL LOW (ref 36.0–46.0)
Hemoglobin: 6.9 g/dL — CL (ref 12.0–15.0)
Hemoglobin: 9 g/dL — ABNORMAL LOW (ref 12.0–15.0)
MCH: 26 pg (ref 26.0–34.0)
MCH: 25.9 pg — ABNORMAL LOW (ref 26.0–34.0)
MCHC: 30.9 g/dL (ref 30.0–36.0)
MCHC: 31 g/dL (ref 30.0–36.0)
MCV: 84.2 fL (ref 80.0–100.0)
MCV: 83.3 fL (ref 80.0–100.0)
Platelets: 37 10*3/uL — ABNORMAL LOW (ref 150–400)
Platelets: 34 10*3/uL — ABNORMAL LOW (ref 150–400)
RBC: 2.65 MIL/uL — ABNORMAL LOW (ref 3.87–5.11)
RBC: 3.48 MIL/uL — ABNORMAL LOW (ref 3.87–5.11)
RDW: 20.8 % — ABNORMAL HIGH (ref 11.5–15.5)
RDW: 19.5 % — ABNORMAL HIGH (ref 11.5–15.5)
WBC: 18.6 10*3/uL — ABNORMAL HIGH (ref 4.0–10.5)
WBC: 18.2 10*3/uL — ABNORMAL HIGH (ref 4.0–10.5)
nRBC: 14.3 % — ABNORMAL HIGH (ref 0.0–0.2)
nRBC: 10.1 % — ABNORMAL HIGH (ref 0.0–0.2)

## 2020-09-17 LAB — RENAL FUNCTION PANEL
Albumin: 1.3 g/dL — ABNORMAL LOW (ref 3.5–5.0)
Albumin: 1.4 g/dL — ABNORMAL LOW (ref 3.5–5.0)
Anion gap: 6 (ref 5–15)
Anion gap: 7 (ref 5–15)
BUN: 26 mg/dL — ABNORMAL HIGH (ref 8–23)
BUN: 25 mg/dL — ABNORMAL HIGH (ref 8–23)
CO2: 26 mmol/L (ref 22–32)
CO2: 26 mmol/L (ref 22–32)
Calcium: 8.6 mg/dL — ABNORMAL LOW (ref 8.9–10.3)
Calcium: 8.8 mg/dL — ABNORMAL LOW (ref 8.9–10.3)
Chloride: 105 mmol/L (ref 98–111)
Chloride: 103 mmol/L (ref 98–111)
Creatinine, Ser: 0.56 mg/dL (ref 0.44–1.00)
Creatinine, Ser: 0.47 mg/dL (ref 0.44–1.00)
GFR, Estimated: >60 mL/min (ref >60)
GFR, Estimated: >60 mL/min (ref >60)
Glucose, Bld: 144 mg/dL — ABNORMAL HIGH (ref 70–99)
Glucose, Bld: 161 mg/dL — ABNORMAL HIGH (ref 70–99)
Phosphorus: 2.3 mg/dL — ABNORMAL LOW (ref 2.5–4.6)
Phosphorus: 2.2 mg/dL — ABNORMAL LOW (ref 2.5–4.6)
Potassium: 4.4 mmol/L (ref 3.5–5.1)
Potassium: 4.3 mmol/L (ref 3.5–5.1)
Sodium: 137 mmol/L (ref 135–145)
Sodium: 136 mmol/L (ref 135–145)

## 2020-09-17 LAB — GLUCOSE, CAPILLARY
Glucose-Capillary: 130 mg/dL — ABNORMAL HIGH (ref 70–99)
Glucose-Capillary: 143 mg/dL — ABNORMAL HIGH (ref 70–99)
Glucose-Capillary: 151 mg/dL — ABNORMAL HIGH (ref 70–99)
Glucose-Capillary: 136 mg/dL — ABNORMAL HIGH (ref 70–99)
Glucose-Capillary: 166 mg/dL — ABNORMAL HIGH (ref 70–99)
Glucose-Capillary: 139 mg/dL — ABNORMAL HIGH (ref 70–99)

## 2020-09-17 LAB — PREPARE RBC (CROSSMATCH)

## 2020-09-17 LAB — MAGNESIUM: Magnesium: 2.5 mg/dL — ABNORMAL HIGH (ref 1.7–2.4)

## 2020-09-17 MED ORDER — SODIUM CHLORIDE 0.9% IV SOLUTION: Freq: Once | INTRAVENOUS | Status: AC

## 2020-09-17 MED ORDER — HYDROCORTISONE NA SUCCINATE PF 100 MG IJ SOLR
50.0000 mg | Freq: Three times a day (TID) | INTRAMUSCULAR | Status: DC
  Administered 2020-09-17 – 2020-09-19 (×6): 50 mg via INTRAVENOUS
  Filled 2020-09-17 (×6): qty 2

## 2020-09-17 NOTE — Progress Notes (Signed)
[facility] ROUNDING NOTE   Subjective:   Brief history 69 year old with history of diabetes hypertension hyperlipidemia colon cancer status post partial colectomy colostomy admitted 08/31/2020 with atrial fibrillation rapid ventricular rate Covid pneumonia found to have perforated duodenal ulcer underwent exploratory laparotomy developed hemorrhagic shock respiratory failure.  Creatinine 5.6 mg/dL on admission secondary to atrial fibrillation and hemodynamic instability.  CT scan did not reveal any evidence of hydronephrosis was started on CRRT 09/08/2020 for oliguric renal failure.  Blood pressure 124/49 pulse 85 temperature 97.3 O2 sats 100% FiO2 40%  IV vasopressin, IV norepinephrine  Kept even on CRRT oliguric/anuric  Sodium 137 potassium 4.4 chloride 105 CO2 26 BUN 26 creatinine 0.56 glucose 144 calcium 8.6 phosphorus 2.3 magnesium 2.5 albumin 1.3     Objective:  Vital signs in last 24 hours:  Temp:  [97.1 F (36.2 C)-97.6 F (36.4 C)] 97.3 F (36.3 C) (11/19 0341) Pulse Rate:  [80-112] 85 (11/19 0700) Resp:  [14-37] 27 (11/19 0700) BP: (99-137)/(56-70) 108/56 (11/19 0400) SpO2:  [94 %-100 %] 100 % (11/19 0700) Arterial Line BP: (95-344)/(44-337) 124/49 (11/19 0700) FiO2 (%):  [40 %-60 %] 40 % (11/19 0514) Weight:  [86.9 kg] 86.9 kg (11/19 0456)  Weight change: -1.5 kg Filed Weights   09/15/20 0500 09/16/20 0428 09/17/20 0456  Weight: 88.6 kg 88.4 kg 86.9 kg    Intake/Output: I/O last 3 completed shifts: In: 5227.5 [I.V.:2746.5; NG/GT:2481] Out: 0539 [Drains:30; JQBHA:1937; TKWIO:9735]   Intake/Output this shift:  No intake/output data recorded.  General: Critically ill looking female intubated, sedated  Heart:RRR, s1s2 nl Lungs: Coarse breath sound posteriorly Abdomen:soft,  Extremities:No edema Dialysis Access: Right IJ temporary HD catheter placed by ICU on 11/10.   Basic Metabolic Panel: Recent Labs  Lab 09/13/20 0306 09/13/20 1644  09/14/20 0520 09/14/20 0851 09/15/20 0443 09/15/20 0443 09/15/20 1552 09/15/20 1552 09/15/20 1725 09/16/20 0412 09/16/20 0922 09/16/20 1600 09/17/20 0409  NA 136   < > 138   < > 137   < > 138   < > 138 137 138 138 137  K 3.8   < > 4.2   < > 4.8   < > 5.0   < > 4.9 4.6 4.7 4.9 4.4  CL 104   < > 106   < > 108  --  109  --   --  105  --  106 105  CO2 27   < > 27   < > 25  --  24  --   --  25  --  26 26  GLUCOSE 199*   < > 129*   < > 122*  --  138*  --   --  148*  --  147* 144*  BUN 27*   < > 30*   < > 24*  --  38*  --   --  29*  --  28* 26*  CREATININE 0.87   < > 0.76   < > 0.70  --  0.93  --   --  0.62  --  0.61 0.56  CALCIUM 8.4*   < > 8.3*   < > 8.4*   < > 8.2*   < >  --  8.4*  --  8.4* 8.6*  MG 2.3  --  2.3  --  2.4  --   --   --   --  2.5*  --   --  2.5*  PHOS 2.7   < > 2.0*   < >  3.0  --  4.1  --   --  2.8  --  3.3 2.3*   < > = values in this interval not displayed.    Liver Function Tests: Recent Labs  Lab 09/11/20 0314 09/11/20 1646 09/12/20 0252 09/12/20 1706 09/13/20 0306 09/13/20 1644 09/14/20 0520 09/14/20 1618 09/15/20 0443 09/15/20 1552 09/16/20 0412 09/16/20 1600 09/17/20 0409  AST 1,002*  --  448*  --  232*  --  133*  --  127*  --   --   --   --   ALT 2,400*  --  1,542*  --  964*  --  616*  --  515*  --   --   --   --   ALKPHOS 236*  --  238*  --  237*  --  240*  --  287*  --   --   --   --   BILITOT 1.0  --  0.9  --  1.2  --  0.5  --  1.1  --   --   --   --   PROT 5.0*  --  4.9*  --  4.8*  --  4.8*  --  5.1*  --   --   --   --   ALBUMIN 1.9*  1.8*   < > 1.7*  1.7*   < > 1.5*   < > 1.5*   < > 1.4* 1.2* 1.3* 1.3* 1.3*   < > = values in this interval not displayed.   No results for input(s): LIPASE, AMYLASE in the last 168 hours. No results for input(s): AMMONIA in the last 168 hours.  CBC: Recent Labs  Lab 09/13/20 0306 09/13/20 0306 09/14/20 0520 09/14/20 0851 09/14/20 1618 09/15/20 0443 09/15/20 1119 09/15/20 1725 09/16/20 0412  09/16/20 0922  WBC 19.5*  --  11.7*  --  5.5 7.6  --   --  19.5*  --   NEUTROABS 16.9*  --   --   --   --   --   --   --   --   --   HGB 7.1*   < > 6.7*   < > 9.4* 9.6*  --  8.5* 8.1* 8.5*  HCT 21.9*   < > 21.1*   < > 29.3* 30.1*  --  25.0* 25.9* 25.0*  MCV 79.9*  --  81.8  --  79.0* 81.8  --   --  84.1  --   PLT 34*   < > 35*  --  32* 26* 27*  --  27*  --    < > = values in this interval not displayed.    Cardiac Enzymes: No results for input(s): CKTOTAL, CKMB, CKMBINDEX, TROPONINI in the last 168 hours.  BNP: Invalid input(s): POCBNP  CBG: Recent Labs  Lab 09/16/20 1104 09/16/20 1515 09/16/20 1940 09/16/20 2334 09/17/20 0339  GLUCAP 160* 133* 160* 130* 130*    Microbiology: Results for orders placed or performed during the hospital encounter of 08/31/20  MRSA PCR Screening     Status: None   Collection Time: 09/08/20  8:17 AM   Specimen: Nasal Mucosa; Nasopharyngeal  Result Value Ref Range Status   MRSA by PCR NEGATIVE NEGATIVE Final    Comment:        The GeneXpert MRSA Assay (FDA approved for NASAL specimens only), is one component of a comprehensive MRSA colonization surveillance program. It is not intended to diagnose MRSA infection nor to guide or  monitor treatment for MRSA infections. Performed at [facility], [place] [address] [place], [place] [address]   Culture, blood (Routine X 2) w Reflex to ID Panel     Status: None   Collection Time: 09/08/20  8:35 AM   Specimen: BLOOD LEFT HAND  Result Value Ref Range Status   Specimen Description BLOOD LEFT HAND  Final   Special Requests   Final    BOTTLES DRAWN AEROBIC ONLY Blood Culture results may not be optimal due to an inadequate volume of blood received in culture bottles   Culture   Final    NO GROWTH 5 DAYS Performed at [facility], [place] [address], [place], [place] [address]    Report Status 09/13/2020 FINAL  Final  Culture, blood (Routine X 2) w Reflex to ID Panel     Status: None    Collection Time: 09/08/20  8:35 AM   Specimen: BLOOD RIGHT HAND  Result Value Ref Range Status   Specimen Description BLOOD RIGHT HAND  Final   Special Requests   Final    BOTTLES DRAWN AEROBIC ONLY Blood Culture results may not be optimal due to an inadequate volume of blood received in culture bottles   Culture   Final    NO GROWTH 5 DAYS Performed at [facility], [place] [address], [place], [place] [address]    Report Status 09/13/2020 FINAL  Final  Culture, respiratory (non-expectorated)     Status: None (Preliminary result)   Collection Time: 09/16/20  8:35 AM   Specimen: Tracheal Aspirate; Respiratory  Result Value Ref Range Status   Specimen Description TRACHEAL ASPIRATE  Final   Special Requests NONE  Final   Gram Stain   Final    ABUNDANT WBC PRESENT, PREDOMINANTLY PMN RARE YEAST Performed at [facility], [address], [place], [place] [address]    Culture PENDING  Incomplete   Report Status PENDING  Incomplete  Culture, respiratory (non-expectorated)     Status: None (Preliminary result)   Collection Time: 09/16/20 11:03 AM   Specimen: Bronchoalveolar Lavage; Respiratory  Result Value Ref Range Status   Specimen Description BRONCHIAL ALVEOLAR LAVAGE  Final   Special Requests NONE  Final   Gram Stain   Final    ABUNDANT WBC PRESENT, PREDOMINANTLY PMN RARE GRAM POSITIVE COCCI Performed at [facility], [place] [address], [place], [place] [address]    Culture PENDING  Incomplete   Report Status PENDING  Incomplete    Coagulation Studies: Recent Labs    09/15/20 1119  LABPROT 14.4  INR 1.2    Urinalysis: No results for input(s): COLORURINE, LABSPEC, PHURINE, GLUCOSEU, HGBUR, BILIRUBINUR, KETONESUR, PROTEINUR, UROBILINOGEN, NITRITE, LEUKOCYTESUR in the last 72 hours.  Invalid input(s): APPERANCEUR    Imaging: DG CHEST PORT 1 VIEW  Result Date: 09/15/2020 CLINICAL DATA:  Respiratory distress EXAM: PORTABLE CHEST 1 VIEW FINDINGS: Interval  placement of a LEFT central venous line tip in distal SVC. RIGHT central venous line unchanged. Endotracheal tube and PICC line unchanged. A feeding tube extend into the stomach. There is bibasilar dense airspace disease unchanged. No pneumothorax IMPRESSION: 1. Placement of central venous line without complication. 2. Stable support apparatus otherwise. 3. Dense bibasilar airspace disease. Electronically Signed   By: Suzy Bouchard M.D.   On: 09/15/2020 13:30     Medications:   .  prismasol BGK 4/2.5 500 mL/hr at 09/16/20 2130  .  prismasol BGK 4/2.5 500 mL/hr at 09/16/20  2324  . sodium chloride 10 mL/hr at 09/17/20 0700  . fentaNYL infusion INTRAVENOUS 225 mcg/hr (09/17/20 0700)  . midazolam 3.5 mg/hr (09/17/20 0700)  . norepinephrine (LEVOPHED) Adult infusion 26 mcg/min (09/17/20 0700)  . prismasol BGK 4/2.5 2,000 mL/hr at 09/17/20 0502  . vasopressin Stopped (09/17/20 0305)   . B-complex with vitamin C  1 tablet Per Tube Daily  . chlorhexidine gluconate (MEDLINE KIT)  15 mL Mouth Rinse BID  . Chlorhexidine Gluconate Cloth  6 each Topical Daily  . chlorpheniramine-HYDROcodone  5 mL Per Tube Q12H  . docusate  100 mg Per Tube BID  . feeding supplement (PIVOT 1.5 CAL)  1,000 mL Per Tube Q24H  . feeding supplement (PROSource TF)  45 mL Per Tube BID  . hydrocortisone sod succinate (SOLU-CORTEF) inj  50 mg Intravenous Q6H  . insulin aspart  0-20 Units Subcutaneous Q4H  . insulin aspart  10 Units Subcutaneous Q4H  . insulin detemir  25 Units Subcutaneous QHS  . insulin detemir  35 Units Subcutaneous Daily  . mouth rinse  15 mL Mouth Rinse 10 times per day  . oxyCODONE  10 mg Per Tube Q6H  . pantoprazole sodium  40 mg Per Tube BID  . polyethylene glycol  17 g Per Tube Daily  . sodium chloride flush  10-40 mL Intracatheter Q12H   sodium chloride, acetaminophen (TYLENOL) oral liquid 160 mg/5 mL, albuterol, diphenhydrAMINE, fentaNYL, heparin, heparin, midazolam, phenol, silver nitrate  applicators, sodium chloride, sodium chloride flush  Assessment/ Plan:  1.Acute kidney injury, oliguric likely ATN due to hemorrhagic shock: Patient initially had AKI with creatinine level of 5.6 on admission due to A. fib with RVR/hemodynamic instability which was improved. However she developed another episode of AKI after shock.  Urinalysis was contaminated/UTI. CT scan A/P without hydronephrosis. Started CRRT on 11/10 because of oliguria and hyperkalemia.  Tolerating well continues on pressors.  Dialysate and replacement fluids 4/2.5.  No change to current prescription  2.Hyperkalemia due to AKI and intra-abdominal bleeding/PRBC transfusion: Now managed with CRRT.  3.Hypophosphatemia: Replete phosphate as needed  4.Hemorrhagic shock: Received blood transfusion.  Currently on Levophed and vasopressin.Maintain MAP >65.   5.Perforated duodenal ulcer status post ex lap: General surgery is following. Currently on tube feed.  6.ARDS/recent Covid pneumonia: Currently on mechanical ventilation, prone position. Per primary team.   LOS: [place] _0 _1 :17 AM

## 2020-09-17 NOTE — Progress Notes (Signed)
CRITICAL VALUE ALERT  Critical Value:  Hgb 6.9  Date & Time Notied:  09/16/2020 at 0830  Provider Notified: Dr. Loanne Drilling  Orders Received/Actions taken: Verbal order for one unit of PRBCs to be given

## 2020-09-17 NOTE — Progress Notes (Signed)
NAMEEmillee [name], MRN:  [id number], DOB:  August 19, 1951, LOS: 89 ADMISSION DATE:  08/31/2020, CONSULTATION DATE:  09/07/2020 REFERRING MD:  Dr Candiss Norse, CHIEF COMPLAINT:  Acute resp failure  Brief History   69 year old female who was previously diagnosed with Covid 08/23/2020.  Admitted 11/2 with AF-RVR, found to have a perforated duodenal underwent exploratory laparotomy with Phillip Heal patch placement.  Past Medical History  Covid pneumonia Atrial fibrillation CKD stage III Diabetes mellitus Hypertension Colon cancer Hyperlipidemia  Significant Hospital Events   11/2 Admitted  11/3 OR -> perforated duodenal ulcer 11/10 progressive hemorrhagic shock, intubated, transfused, pressors, proned; started on CRRT in PM 11/16 Extubated. Re-intubated overnight due to respiratory distress and hypoxia with decreased mentation 11/18  bronch'd/ cultures sent 11/19 hgb down getting blood  Consults:  Cardiology [facility]   Procedures:  11/3 Exploratory laparotomy, Phillip Heal patch, lysis of adhesion for duodenal ulceration postop day 6  R PICC 11/5 >> A line 11/9 >> ETT 11/9 > 11/16 ETT 11/16 >>  Lt [place] CVL 11/9 >> R IJ trialysis >>   Significant Diagnostic Tests:  11/3 CT abd/ pelvis > 1. Positive for bowel perforation: Pneumoperitoneum and intermediate density free fluid in the abdomen. Prior total colectomy. The specific site of perforation is unclear-oral contrast present to the proximal jejunum has not obviously leaked. Note that there may be small bowel loops adherent to the ventral abdominal wall along the greater curve of the stomach. 2. Extensive bilateral lower lung pneumonia. No pleural effusion. 3. Other abdominal and pelvic viscera are stable since 2015, including bilateral adrenal adenomas. Chronic renal parapelvic cysts. 4. Aortic Atherosclerosis 11/3 TTE > EF 70-75%, RV not well visualized, mildly reduced RV systolic function 19/62 CT chest/ abd/ pelvis> 1. Interval  progression of diffuse bilateral hazy ground-glass airspace opacities with more focal areas of consolidation at the lung bases 2. Trace bilateral pleural effusions. 3. Postsurgical changes the abdomen as detailed above. No evidence for a postoperative abscess, however evaluation is limited by lack of IV contrast. 4. There is a 1.9 cm cystic appearing lesion located in the pancreatic body. This was not present on the patient's CT from 2015.  Follow-up with an outpatient contrast enhanced MRI is recommended. 5. The endometrial stripe appears diffusely thickened. Follow-up with pelvic ultrasound is recommended. Aortic Atherosclerosis 11/14 LE doppler studies > + DVT of right posterior tibial and peroneal vein, +dVT of left posterior tibial vein   Micro Data:  11/10 MRSA PCR > neg 11/10 BC x 2 > neg  Antimicrobials:  azithro 11/2 >11/3 Ceftriaxone 11/2  Fluconazole 11/3 Zosyn 11/3 >> 11/7 Vanc 11/10 Cefepime 11/10 > 11/16  Subjective:  weaning pressors   Objective   Blood pressure 122/66, pulse (Abnormal) 112, temperature 98 F (36.7 C), temperature source Oral, resp. rate (Abnormal) 23, height 5\' 8"  (1.727 m), weight 86.9 kg, SpO2 94 %.    Vent Mode: PRVC FiO2 (%):  [40 %-60 %] 40 % Set Rate:  [34 bmp] 34 bmp Vt Set:  [400 mL] 400 mL PEEP:  [12 cmH20] 12 cmH20 Plateau Pressure:  [28 cmH20-31 cmH20] 31 cmH20   Intake/Output Summary (Last 24 hours) at 09/17/2020 0917 Last data filed at 09/17/2020 0900 Gross per 24 hour  Intake 3281.1 ml  Output 3658 ml  Net -376.9 ml   Filed Weights   09/15/20 0500 09/16/20 0428 09/17/20 0456  Weight: 88.6 kg 88.4 kg 86.9 kg    Examination:  General this is a critically ill 69 year old  female who remains sedated on vent HENT temporal wasting orally intubated Pulm bibasilar rales. No accessory use current PPlat 25. sats 955 Card rrr tachy abd liquid stool from ostomy dressing intact Ext diffuse anasarca pulses palp CR brisk Neuro  sedated GU anuric  Assessment & Plan:   Acute hypoxic/hypercapnic respiratory failure due to ARDS from COVID-19 pneumonia c/b HCAP and failure to wean and failed extubation attempt pcxr w/ bilateral infiltrates. Sl improved aeration  Completed abx; re-cultured. Showing rare gpc to date. Wbc remain elevated Plan Cont low VT ventilation  Wean PEEP/FIO2 Goal PIP < 30/ goal driving pressure < 15 PAD protocol for RASS -2 to -3 (this is a challenge as usual per covid related lung injury) Will need trach Slow titration of sedating meds s/p trach  Multifactorial shock, previously hemorrhagic and now likely septic Plan Keep euvolemic Weaning pressors; MAP goal > 65 Start to wean hydrocortisone  Cont tele   Acute blood loss anemia-->did drop again last night ti . No clear bleeding  Plan Transfuse X 1 F/u cbc Goal hgb >7 Holding ac  Anuric AKI on CRRT Plan Cont CRRT Keep even F/u chems  Duodenal ulcer perforation status post ex lap and Graham's patch Plan Per surg Cont routine site care  Paroxysmal A. fib, currently in sinus rhythm Plan Cont tele   Acute hepatitis likely due to shock liver, improving Plan Trend   Thrombocytopenia-->still low; but slightly improved Plan Trend, transfuse if has active bleeding or <15K  Recurrent hypoglycemia at night; lantus adjusted now w/ excellent control Plan Cont ssi Cont current ssi, tubefeed coverage and lantus Goal glucose 140-180  1.9 cm cystic appearing lesion located in the pancreatic body Plan F/u outpt    Best practice:  Diet: Tube feeds Pain/Anxiety/Delirium protocol (if indicated) midazolam fentanyl VAP protocol (if indicated): yes DVT prophylaxis: SCDs GI prophylaxis: Protonix BID Glucose control: Basal and bolus Mobility: Bedrest Code Status: Full Family Communication: Family updated on plan of care.  Disposition: ICU  My cct 34 min   Erick Colace ACNP-BC [place] Pager #  [phone] OR # [phone] if no answer

## 2020-09-17 NOTE — Plan of Care (Signed)
  Problem: Activity: Goal: Ability to tolerate increased activity will improve Outcome: Not Progressing Note: Unable to mobilize due to critical illness   Problem: Respiratory: Goal: Ability to maintain a clear airway and adequate ventilation will improve Note: Pt maintaining patent airway and able to decrease PEEP this morning.

## 2020-09-18 DIAGNOSIS — I4891 Unspecified atrial fibrillation: Secondary | ICD-10-CM | POA: Diagnosis not present

## 2020-09-18 LAB — GLUCOSE, CAPILLARY
Glucose-Capillary: 129 mg/dL — ABNORMAL HIGH (ref 70–99)
Glucose-Capillary: 137 mg/dL — ABNORMAL HIGH (ref 70–99)
Glucose-Capillary: 148 mg/dL — ABNORMAL HIGH (ref 70–99)
Glucose-Capillary: 140 mg/dL — ABNORMAL HIGH (ref 70–99)
Glucose-Capillary: 164 mg/dL — ABNORMAL HIGH (ref 70–99)

## 2020-09-18 LAB — TYPE AND SCREEN
ABO/RH(D): B POS
Antibody Screen: NEGATIVE
Unit division: 0
Unit division: 0

## 2020-09-18 LAB — RENAL FUNCTION PANEL
Albumin: 1.3 g/dL — ABNORMAL LOW (ref 3.5–5.0)
Albumin: 1.3 g/dL — ABNORMAL LOW (ref 3.5–5.0)
Anion gap: 6 (ref 5–15)
Anion gap: 7 (ref 5–15)
BUN: 25 mg/dL — ABNORMAL HIGH (ref 8–23)
BUN: 33 mg/dL — ABNORMAL HIGH (ref 8–23)
CO2: 27 mmol/L (ref 22–32)
CO2: 26 mmol/L (ref 22–32)
Calcium: 8.5 mg/dL — ABNORMAL LOW (ref 8.9–10.3)
Calcium: 8.9 mg/dL (ref 8.9–10.3)
Chloride: 103 mmol/L (ref 98–111)
Chloride: 103 mmol/L (ref 98–111)
Creatinine, Ser: 0.45 mg/dL (ref 0.44–1.00)
Creatinine, Ser: 0.67 mg/dL (ref 0.44–1.00)
GFR, Estimated: >60 mL/min (ref >60)
GFR, Estimated: >60 mL/min (ref >60)
Glucose, Bld: 147 mg/dL — ABNORMAL HIGH (ref 70–99)
Glucose, Bld: 152 mg/dL — ABNORMAL HIGH (ref 70–99)
Phosphorus: 2.1 mg/dL — ABNORMAL LOW (ref 2.5–4.6)
Phosphorus: 2.2 mg/dL — ABNORMAL LOW (ref 2.5–4.6)
Potassium: 4.1 mmol/L (ref 3.5–5.1)
Potassium: 4.7 mmol/L (ref 3.5–5.1)
Sodium: 136 mmol/L (ref 135–145)
Sodium: 136 mmol/L (ref 135–145)

## 2020-09-18 LAB — CULTURE, RESPIRATORY W GRAM STAIN: Culture: NORMAL

## 2020-09-18 LAB — BPAM RBC
Blood Product Expiration Date: 202112042359
Blood Product Expiration Date: 202112072359
ISSUE DATE / TIME: 202111161056
ISSUE DATE / TIME: 202111190905
Unit Type and Rh: 7300
Unit Type and Rh: 7300

## 2020-09-18 LAB — MAGNESIUM: Magnesium: 2.6 mg/dL — ABNORMAL HIGH (ref 1.7–2.4)

## 2020-09-18 NOTE — Progress Notes (Signed)
[facility] ROUNDING NOTE   Subjective:   Brief history 69 year old with history of diabetes hypertension hyperlipidemia colon cancer status post partial colectomy colostomy admitted 08/31/2020 with atrial fibrillation rapid ventricular rate Covid pneumonia found to have perforated duodenal ulcer underwent exploratory laparotomy developed hemorrhagic shock respiratory failure.  Creatinine 5.6 mg/dL on admission secondary to atrial fibrillation and hemodynamic instability.  CT scan did not reveal any evidence of hydronephrosis was started on CRRT 09/08/2020 for oliguric renal failure.  Blood pressure 111/62 pulse 114 temperature 97.4  IV vasopressin, IV norepinephrine  Kept even on CRRT oliguric/anuric  Sodium 136 potassium 4.1 chloride 103 CO2 27 BUN 25 creatinine 0.45 glucose 147 calcium 8.5 phosphorus 2.1 magnesium 2.6 albumin 1.3 hemoglobin 9.0     Objective:  Vital signs in last 24 hours:  Temp:  [93.8 F (34.3 C)-98.1 F (36.7 C)] 97.4 F (36.3 C) (11/20 0552) Pulse Rate:  [61-122] 115 (11/20 0700) Resp:  [11-38] 28 (11/20 0700) BP: (89-123)/(53-70) 93/56 (11/20 0700) SpO2:  [94 %-100 %] 97 % (11/20 0700) Arterial Line BP: (108-131)/(47-52) 111/49 (11/19 1400) FiO2 (%):  [40 %-50 %] 50 % (11/20 0400) Weight:  [86.1 kg] 86.1 kg (11/20 0500)  Weight change: -0.8 kg Filed Weights   09/16/20 0428 09/17/20 0456 09/18/20 0500  Weight: 88.4 kg 86.9 kg 86.1 kg    Intake/Output: I/O last 3 completed shifts: In: 4981.8 [I.V.:1851.8; Blood:325; NG/GT:2805] Out: 8250 [Drains:30; IBBCW:8889; VQXIH:0388]   Intake/Output this shift:  No intake/output data recorded.  General: Critically ill looking female intubated, sedated  Heart:RRR, s1s2 nl Lungs: Coarse breath sound posteriorly Abdomen:soft,  Extremities:No edema Dialysis Access: Right IJ temporary HD catheter placed by ICU on 11/10.   Basic Metabolic Panel: Recent Labs  Lab 09/14/20 0520  09/14/20 0851 09/15/20 0443 09/15/20 1552 09/16/20 0412 09/16/20 0922 09/16/20 1600 09/16/20 1600 09/17/20 0409 09/17/20 0859 09/17/20 1613 09/18/20 0330  NA 138   < > 137   < > 137   < > 138  --  137 138 136 136  K 4.2   < > 4.8   < > 4.6   < > 4.9  --  4.4 4.4 4.3 4.1  CL 106   < > 108   < > 105  --  106  --  105  --  103 103  CO2 27   < > 25   < > 25  --  26  --  26  --  26 27  GLUCOSE 129*   < > 122*   < > 148*  --  147*  --  144*  --  161* 147*  BUN 30*   < > 24*   < > 29*  --  28*  --  26*  --  25* 25*  CREATININE 0.76   < > 0.70   < > 0.62  --  0.61  --  0.56  --  0.47 0.45  CALCIUM 8.3*   < > 8.4*   < > 8.4*  --  8.4*   < > 8.6*  --  8.8* 8.5*  MG 2.3  --  2.4  --  2.5*  --   --   --  2.5*  --   --  2.6*  PHOS 2.0*   < > 3.0   < > 2.8  --  3.3  --  2.3*  --  2.2* 2.1*   < > = values in this interval not displayed.  Liver Function Tests: Recent Labs  Lab 09/12/20 0252 09/12/20 1706 09/13/20 0306 09/13/20 1644 09/14/20 0520 09/14/20 1618 09/15/20 0443 09/15/20 1552 09/16/20 0412 09/16/20 1600 09/17/20 0409 09/17/20 1613 09/18/20 0330  AST 448*  --  232*  --  133*  --  127*  --   --   --   --   --   --   ALT 1,542*  --  964*  --  616*  --  515*  --   --   --   --   --   --   ALKPHOS 238*  --  237*  --  240*  --  287*  --   --   --   --   --   --   BILITOT 0.9  --  1.2  --  0.5  --  1.1  --   --   --   --   --   --   PROT 4.9*  --  4.8*  --  4.8*  --  5.1*  --   --   --   --   --   --   ALBUMIN 1.7*  1.7*   < > 1.5*   < > 1.5*   < > 1.4*   < > 1.3* 1.3* 1.3* 1.4* 1.3*   < > = values in this interval not displayed.   No results for input(s): LIPASE, AMYLASE in the last 168 hours. No results for input(s): AMMONIA in the last 168 hours.  CBC: Recent Labs  Lab 09/13/20 0306 09/14/20 0520 09/14/20 1618 09/14/20 1618 09/15/20 0443 09/15/20 1119 09/15/20 1725 09/16/20 0412 09/16/20 0922 09/17/20 0736 09/17/20 0859 09/17/20 1613  WBC 19.5*   < > 5.5   --  7.6  --   --  19.5*  --  18.6*  --  18.2*  NEUTROABS 16.9*  --   --   --   --   --   --   --   --   --   --   --   HGB 7.1*   < > 9.4*   < > 9.6*  --    < > 8.1* 8.5* 6.9* 7.1* 9.0*  HCT 21.9*   < > 29.3*   < > 30.1*  --    < > 25.9* 25.0* 22.3* 21.0* 29.0*  MCV 79.9*   < > 79.0*  --  81.8  --   --  84.1  --  84.2  --  83.3  PLT 34*   < > 32*   < > 26* 27*  --  27*  --  37*  --  34*   < > = values in this interval not displayed.    Cardiac Enzymes: No results for input(s): CKTOTAL, CKMB, CKMBINDEX, TROPONINI in the last 168 hours.  BNP: Invalid input(s): POCBNP  CBG: Recent Labs  Lab 09/17/20 1118 09/17/20 1612 09/17/20 1935 09/17/20 2313 09/18/20 0343  GLUCAP 151* 136* 166* 139* 129*    Microbiology: Results for orders placed or performed during the hospital encounter of 08/31/20  MRSA PCR Screening     Status: None   Collection Time: 09/08/20  8:17 AM   Specimen: Nasal Mucosa; Nasopharyngeal  Result Value Ref Range Status   MRSA by PCR NEGATIVE NEGATIVE Final    Comment:        The GeneXpert MRSA Assay (FDA approved for NASAL specimens only), is one component of a comprehensive MRSA colonization surveillance program.  It is not intended to diagnose MRSA infection nor to guide or monitor treatment for MRSA infections. Performed at [facility], [place] [address] [place], [place] [address]   Culture, blood (Routine X 2) w Reflex to ID Panel     Status: None   Collection Time: 09/08/20  8:35 AM   Specimen: BLOOD LEFT HAND  Result Value Ref Range Status   Specimen Description BLOOD LEFT HAND  Final   Special Requests   Final    BOTTLES DRAWN AEROBIC ONLY Blood Culture results may not be optimal due to an inadequate volume of blood received in culture bottles   Culture   Final    NO GROWTH 5 DAYS Performed at [facility], [place] [address], [place], [place] [address]    Report Status 09/13/2020 FINAL  Final  Culture, blood (Routine X 2) w Reflex to ID  Panel     Status: None   Collection Time: 09/08/20  8:35 AM   Specimen: BLOOD RIGHT HAND  Result Value Ref Range Status   Specimen Description BLOOD RIGHT HAND  Final   Special Requests   Final    BOTTLES DRAWN AEROBIC ONLY Blood Culture results may not be optimal due to an inadequate volume of blood received in culture bottles   Culture   Final    NO GROWTH 5 DAYS Performed at [facility], [place] [address] [place], [place] [address]    Report Status 09/13/2020 FINAL  Final  Culture, respiratory (non-expectorated)     Status: None (Preliminary result)   Collection Time: 09/16/20  8:35 AM   Specimen: Tracheal Aspirate; Respiratory  Result Value Ref Range Status   Specimen Description TRACHEAL ASPIRATE  Final   Special Requests NONE  Final   Gram Stain   Final    ABUNDANT WBC PRESENT, PREDOMINANTLY PMN RARE YEAST    Culture   Final    CULTURE REINCUBATED FOR BETTER GROWTH Performed at [facility], [place] [address], [place], [place] [address]    Report Status PENDING  Incomplete  Culture, respiratory (non-expectorated)     Status: None (Preliminary result)   Collection Time: 09/16/20 11:03 AM   Specimen: Bronchoalveolar Lavage; Respiratory  Result Value Ref Range Status   Specimen Description BRONCHIAL ALVEOLAR LAVAGE  Final   Special Requests NONE  Final   Gram Stain   Final    ABUNDANT WBC PRESENT, PREDOMINANTLY PMN RARE GRAM POSITIVE COCCI    Culture   Final    CULTURE REINCUBATED FOR BETTER GROWTH Performed at [facility], [address], [place],  [address]    Report Status PENDING  Incomplete    Coagulation Studies: Recent Labs    09/15/20 1119  LABPROT 14.4  INR 1.2    Urinalysis: No results for input(s): COLORURINE, LABSPEC, PHURINE, GLUCOSEU, HGBUR, BILIRUBINUR, KETONESUR, PROTEINUR, UROBILINOGEN, NITRITE, LEUKOCYTESUR in the last 72 hours.  Invalid input(s): APPERANCEUR    Imaging: No results found.   Medications:   .   prismasol BGK 4/2.5 500 mL/hr at 09/18/20 0403  .  prismasol BGK 4/2.5 500 mL/hr at 09/18/20 0602  . sodium chloride 10 mL/hr at 09/18/20 0700  . fentaNYL infusion INTRAVENOUS 225 mcg/hr (09/18/20 0732)  . midazolam 3 mg/hr (09/18/20 0700)  . norepinephrine (LEVOPHED) Adult infusion 5 mcg/min (09/18/20 0700)  . prismasol BGK 4/2.5 2,000 mL/hr at 09/18/20 0602  . vasopressin Stopped (09/17/20 0305)   . sodium chloride   Intravenous Once  .  B-complex with vitamin C  1 tablet Per Tube Daily  . chlorhexidine gluconate (MEDLINE KIT)  15 mL Mouth Rinse BID  . Chlorhexidine Gluconate Cloth  6 each Topical Daily  . chlorpheniramine-HYDROcodone  5 mL Per Tube Q12H  . feeding supplement (PIVOT 1.5 CAL)  1,000 mL Per Tube Q24H  . feeding supplement (PROSource TF)  45 mL Per Tube BID  . hydrocortisone sod succinate (SOLU-CORTEF) inj  50 mg Intravenous Q8H  . insulin aspart  0-20 Units Subcutaneous Q4H  . insulin aspart  10 Units Subcutaneous Q4H  . insulin detemir  25 Units Subcutaneous QHS  . insulin detemir  35 Units Subcutaneous Daily  . mouth rinse  15 mL Mouth Rinse 10 times per day  . oxyCODONE  10 mg Per Tube Q6H  . pantoprazole sodium  40 mg Per Tube BID  . polyethylene glycol  17 g Per Tube Daily  . sodium chloride flush  10-40 mL Intracatheter Q12H   sodium chloride, acetaminophen (TYLENOL) oral liquid 160 mg/5 mL, albuterol, diphenhydrAMINE, fentaNYL, heparin, heparin, midazolam, phenol, silver nitrate applicators, sodium chloride, sodium chloride flush  Assessment/ Plan:  1.Acute kidney injury, oliguric likely ATN due to hemorrhagic shock: Patient initially had AKI with creatinine level of 5.6 on admission due to A. fib with RVR/hemodynamic instability which was improved. However she developed another episode of AKI after shock.  Urinalysis was contaminated/UTI. CT scan A/P without hydronephrosis. Started CRRT on 11/10 because of oliguria and hyperkalemia.  Tolerating well  continues on pressors.  Dialysate and replacement fluids 4/2.5.  No change to current prescription  2.Hyperkalemia due to AKI and intra-abdominal bleeding/PRBC transfusion: Now managed with CRRT.  3.Hypophosphatemia: Replete phosphate as needed  4.Hemorrhagic shock: Received blood transfusion.  Currently on Levophed and vasopressin.Maintain MAP >65.   5.Perforated duodenal ulcer status post ex lap: General surgery is following. Currently on tube feed.  6.ARDS/recent Covid pneumonia: Currently on mechanical ventilation, prone position. Per primary team.   LOS: [place] _0 _1 :11 AM

## 2020-09-18 NOTE — Progress Notes (Signed)
NAMELorene [name], MRN:  [id number], DOB:  May 09, 1951, LOS: 59 ADMISSION DATE:  08/31/2020, CONSULTATION DATE:  09/07/2020 REFERRING MD:  Dr Candiss Norse, CHIEF COMPLAINT:  Acute resp failure  Brief History   69 year old female who was previously diagnosed with Covid 08/23/2020.  Admitted 11/2 with AF-RVR, found to have a perforated duodenal underwent exploratory laparotomy with Phillip Heal patch placement.  Past Medical History  Covid pneumonia Atrial fibrillation CKD stage III Diabetes mellitus Hypertension Colon cancer Hyperlipidemia  Significant Hospital Events   11/2 Admitted  11/3 OR -> perforated duodenal ulcer 11/10 progressive hemorrhagic shock, intubated, transfused, pressors, proned; started on CRRT in PM 11/16 Extubated. Re-intubated overnight due to respiratory distress and hypoxia with decreased mentation 11/18  bronch'd/ cultures sent 11/19 hgb down getting blood  Consults:  Cardiology [facility] Nephrology   Procedures:  11/3 Exploratory laparotomy, Phillip Heal patch, lysis of adhesion for duodenal ulceration postop day 6  R PICC 11/5 >> A line 11/9 >> ETT 11/9 > 11/16, 11/16 >>  Lt [place] CVL 11/9 >> R IJ trialysis >>   Significant Diagnostic Tests:  11/3 CT abd/ pelvis > 1. Positive for bowel perforation: Pneumoperitoneum and intermediate density free fluid in the abdomen. Prior total colectomy. The specific site of perforation is unclear-oral contrast present to the proximal jejunum has not obviously leaked. Note that there may be small bowel loops adherent to the ventral abdominal wall along the greater curve of the stomach. 2. Extensive bilateral lower lung pneumonia. No pleural effusion. 3. Other abdominal and pelvic viscera are stable since 2015, including bilateral adrenal adenomas. Chronic renal parapelvic cysts. 4. Aortic Atherosclerosis 11/3 TTE > EF 70-75%, RV not well visualized, mildly reduced RV systolic function 78/58 CT chest/ abd/ pelvis> 1. Interval  progression of diffuse bilateral hazy ground-glass airspace opacities with more focal areas of consolidation at the lung bases 2. Trace bilateral pleural effusions. 3. Postsurgical changes the abdomen as detailed above. No evidence for a postoperative abscess, however evaluation is limited by lack of IV contrast. 4. There is a 1.9 cm cystic appearing lesion located in the pancreatic body. This was not present on the patient's CT from 2015.  Follow-up with an outpatient contrast enhanced MRI is recommended. 5. The endometrial stripe appears diffusely thickened. Follow-up with pelvic ultrasound is recommended. Aortic Atherosclerosis 11/14 LE doppler studies > + DVT of right posterior tibial and peroneal vein, +dVT of left posterior tibial vein   Micro Data:  11/10 MRSA PCR > neg 11/10 BC x 2 > neg  Antimicrobials:  azithro 11/2 >11/3 Ceftriaxone 11/2  Fluconazole 11/3 Zosyn 11/3 >> 11/7 Vanc 11/10 Cefepime 11/10 > 11/16  Subjective:  Weaned off levophed mid-day. On FIO2 50 PEEP 8  Objective   Blood pressure (!) 100/59, pulse (!) 102, temperature 99.4 F (37.4 C), temperature source Axillary, resp. rate 17, height 5\' 8"  (1.727 m), weight 86.1 kg, SpO2 96 %.    Vent Mode: PRVC FiO2 (%):  [40 %-50 %] 50 % Set Rate:  [35 bmp] 35 bmp Vt Set:  [400 mL] 400 mL PEEP:  [8 cmH20-10 cmH20] 8 cmH20 Plateau Pressure:  [22 cmH20-28 cmH20] 24 cmH20   Intake/Output Summary (Last 24 hours) at 09/18/2020 1759 Last data filed at 09/18/2020 1700 Gross per 24 hour  Intake 2696.61 ml  Output 2886 ml  Net -189.39 ml   Filed Weights   09/16/20 0428 09/17/20 0456 09/18/20 0500  Weight: 88.4 kg 86.9 kg 86.1 kg   Physical Exam: General: Critically ill-appearing, sedated  HENT: [place], AT, ETT in place Eyes: EOMI, no scleral icterus Respiratory: Clear to auscultation bilaterally.  No crackles, wheezing or rales Cardiovascular: RRR, -M/R/G, no JVD GI: BS+, soft, ostomy dressing Extremities: 1+  edema,-tenderness Neuro: Sedated  Assessment & Plan:   Acute hypoxic/hypercapnic respiratory failure due to ARDS from COVID-19 pneumonia c/b HCAP and failure to wean and failed extubation 11/16 Difficulty weaning off ventilator Plan Full vent. Wean PEEP/FIO2 for goal SpO2 >88-95% PAD protocol Will likely need tracheostomy  Multifactorial shock, previously hemorrhagic, concerned for sepsis vs sedation related hypotension Cultures NGTD Plan Wean vasopressors for goal MAP >65 On scheduled steroids Daily CBC  Acute blood loss anemia-->did drop again last night ti . No clear bleeding  Plan Trend CBC Transfuse for goal Hg >7  Anuric AKI on CRRT Plan Cont CRRT Appreciate Nephro input  Duodenal ulcer perforation status post ex lap and Graham's patch Plan Per surg Cont routine site care  Paroxysmal A. fib, currently in sinus rhythm Plan Cont tele   Thrombocytopenia-->still low; but slightly improved Plan Trend, transfuse if has active bleeding or <15K  Recurrent hypoglycemia at night; lantus adjusted now w/ excellent control Plan Cont ssi Cont current ssi, tubefeed coverage and lantus Goal glucose 140-180  1.9 cm cystic appearing lesion located in the pancreatic body Plan F/u outpt    Best practice:  Diet: Tube feeds Pain/Anxiety/Delirium protocol (if indicated) midazolam fentanyl VAP protocol (if indicated): yes DVT prophylaxis: SCDs GI prophylaxis: Protonix BID Glucose control: Basal and bolus Mobility: Bedrest Code Status: Full Family Communication: Family updated on plan of care.  Disposition: ICU  The patient is critically ill with multiple organ systems failure and requires high complexity decision making for assessment and support, frequent evaluation and titration of therapies, application of advanced monitoring technologies and extensive interpretation of multiple databases.  Independent Critical Care Time: 35 Minutes.   Rodman Pickle, M.D. [facility]  Pulmonary/Critical Care Medicine 09/18/2020 6:00 PM   Please see Amion for pager number to reach on-call Pulmonary and Critical Care Team.

## 2020-09-19 DIAGNOSIS — I4891 Unspecified atrial fibrillation: Secondary | ICD-10-CM | POA: Diagnosis not present

## 2020-09-19 LAB — RENAL FUNCTION PANEL
Albumin: 1.4 g/dL — ABNORMAL LOW (ref 3.5–5.0)
Albumin: 1.4 g/dL — ABNORMAL LOW (ref 3.5–5.0)
Anion gap: 7 (ref 5–15)
Anion gap: 6 (ref 5–15)
BUN: 34 mg/dL — ABNORMAL HIGH (ref 8–23)
BUN: 30 mg/dL — ABNORMAL HIGH (ref 8–23)
CO2: 26 mmol/L (ref 22–32)
CO2: 26 mmol/L (ref 22–32)
Calcium: 8.6 mg/dL — ABNORMAL LOW (ref 8.9–10.3)
Calcium: 8.6 mg/dL — ABNORMAL LOW (ref 8.9–10.3)
Chloride: 104 mmol/L (ref 98–111)
Chloride: 103 mmol/L (ref 98–111)
Creatinine, Ser: 0.48 mg/dL (ref 0.44–1.00)
Creatinine, Ser: 0.52 mg/dL (ref 0.44–1.00)
GFR, Estimated: >60 mL/min (ref >60)
GFR, Estimated: >60 mL/min (ref >60)
Glucose, Bld: 190 mg/dL — ABNORMAL HIGH (ref 70–99)
Glucose, Bld: 204 mg/dL — ABNORMAL HIGH (ref 70–99)
Phosphorus: 1.8 mg/dL — ABNORMAL LOW (ref 2.5–4.6)
Phosphorus: 3.7 mg/dL (ref 2.5–4.6)
Potassium: 4.1 mmol/L (ref 3.5–5.1)
Potassium: 4.8 mmol/L (ref 3.5–5.1)
Sodium: 137 mmol/L (ref 135–145)
Sodium: 135 mmol/L (ref 135–145)

## 2020-09-19 LAB — CBC
HCT: 25.9 % — ABNORMAL LOW (ref 36.0–46.0)
Hemoglobin: 7.9 g/dL — ABNORMAL LOW (ref 12.0–15.0)
MCH: 26 pg (ref 26.0–34.0)
MCHC: 30.5 g/dL (ref 30.0–36.0)
MCV: 85.2 fL (ref 80.0–100.0)
Platelets: 41 10*3/uL — ABNORMAL LOW (ref 150–400)
RBC: 3.04 MIL/uL — ABNORMAL LOW (ref 3.87–5.11)
RDW: 20 % — ABNORMAL HIGH (ref 11.5–15.5)
WBC: 14.4 10*3/uL — ABNORMAL HIGH (ref 4.0–10.5)
nRBC: 10.2 % — ABNORMAL HIGH (ref 0.0–0.2)

## 2020-09-19 LAB — GLUCOSE, CAPILLARY
Glucose-Capillary: 103 mg/dL — ABNORMAL HIGH (ref 70–99)
Glucose-Capillary: 152 mg/dL — ABNORMAL HIGH (ref 70–99)
Glucose-Capillary: 173 mg/dL — ABNORMAL HIGH (ref 70–99)
Glucose-Capillary: 182 mg/dL — ABNORMAL HIGH (ref 70–99)
Glucose-Capillary: 183 mg/dL — ABNORMAL HIGH (ref 70–99)
Glucose-Capillary: 183 mg/dL — ABNORMAL HIGH (ref 70–99)
Glucose-Capillary: 183 mg/dL — ABNORMAL HIGH (ref 70–99)

## 2020-09-19 LAB — MAGNESIUM: Magnesium: 2.4 mg/dL (ref 1.7–2.4)

## 2020-09-19 MED ORDER — POTASSIUM PHOSPHATES 15 MMOLE/5ML IV SOLN
20.0000 mmol | Freq: Once | INTRAVENOUS | Status: AC
  Administered 2020-09-19: 20 mmol via INTRAVENOUS
  Filled 2020-09-19: qty 6.67

## 2020-09-19 MED ORDER — HYDROCORTISONE NA SUCCINATE PF 100 MG IJ SOLR
50.0000 mg | Freq: Every day | INTRAMUSCULAR | Status: DC
  Administered 2020-09-20 – 2020-09-23 (×4): 50 mg via INTRAVENOUS
  Filled 2020-09-19 (×4): qty 2

## 2020-09-19 NOTE — Plan of Care (Signed)
°  Problem: Respiratory: Goal: Ability to maintain a clear airway and adequate ventilation will improve Outcome: Progressing Note: Slowly able to wean ventilator settings and patient tolerating it well.   Problem: Activity: Goal: Ability to tolerate increased activity will improve Outcome: Not Progressing Note: Unable to mobilize due to critical illness

## 2020-09-19 NOTE — Progress Notes (Signed)
NAMEChaselyn [name], MRN:  [id number], DOB:  12/23/1950, LOS: 6 ADMISSION DATE:  08/31/2020, CONSULTATION DATE:  09/07/2020 REFERRING MD:  Dr Candiss Norse, CHIEF COMPLAINT:  Acute resp failure  Brief History   69 year old female who was previously diagnosed with Covid 08/23/2020.  Admitted 11/2 with AF-RVR, found to have a perforated duodenal underwent exploratory laparotomy with Phillip Heal patch placement.  Past Medical History  Covid pneumonia Atrial fibrillation CKD stage III Diabetes mellitus Hypertension Colon cancer Hyperlipidemia  Significant Hospital Events   11/2 Admitted  11/3 OR -> perforated duodenal ulcer 11/10 progressive hemorrhagic shock, intubated, transfused, pressors, proned; started on CRRT in PM 11/16 Extubated. Re-intubated overnight due to respiratory distress and hypoxia with decreased mentation 11/18  bronch'd/ cultures sent 11/19 hgb down getting blood  Consults:  Cardiology [facility] Nephrology   Procedures:  11/3 Exploratory laparotomy, Phillip Heal patch, lysis of adhesion for duodenal ulceration postop day 6  R PICC 11/5 >> A line 11/9 >> ETT 11/9 > 11/16, 11/16 >>  Lt [place] CVL 11/9 >> R IJ trialysis >>   Significant Diagnostic Tests:  11/3 CT abd/ pelvis > 1. Positive for bowel perforation: Pneumoperitoneum and intermediate density free fluid in the abdomen. Prior total colectomy. The specific site of perforation is unclear-oral contrast present to the proximal jejunum has not obviously leaked. Note that there may be small bowel loops adherent to the ventral abdominal wall along the greater curve of the stomach. 2. Extensive bilateral lower lung pneumonia. No pleural effusion. 3. Other abdominal and pelvic viscera are stable since 2015, including bilateral adrenal adenomas. Chronic renal parapelvic cysts. 4. Aortic Atherosclerosis 11/3 TTE > EF 70-75%, RV not well visualized, mildly reduced RV systolic function 13/24 CT chest/ abd/ pelvis> 1. Interval  progression of diffuse bilateral hazy ground-glass airspace opacities with more focal areas of consolidation at the lung bases 2. Trace bilateral pleural effusions. 3. Postsurgical changes the abdomen as detailed above. No evidence for a postoperative abscess, however evaluation is limited by lack of IV contrast. 4. There is a 1.9 cm cystic appearing lesion located in the pancreatic body. This was not present on the patient's CT from 2015.  Follow-up with an outpatient contrast enhanced MRI is recommended. 5. The endometrial stripe appears diffusely thickened. Follow-up with pelvic ultrasound is recommended. Aortic Atherosclerosis 11/14 LE doppler studies > + DVT of right posterior tibial and peroneal vein, +dVT of left posterior tibial vein   Micro Data:  11/10 MRSA PCR > neg 11/10 BC x 2 > neg  Antimicrobials:  azithro 11/2 >11/3 Ceftriaxone 11/2  Fluconazole 11/3 Zosyn 11/3 >> 11/7 Vanc 11/10 Cefepime 11/10 > 11/16  Subjective:  Remains on vasopressors. On CRRT.  Objective   Blood pressure 113/64, pulse 86, temperature (!) 97.4 F (36.3 C), temperature source Oral, resp. rate (!) 24, height 5\' 8"  (1.727 m), weight 84.7 kg, SpO2 95 %.    Vent Mode: PRVC FiO2 (%):  [30 %-50 %] 30 % Set Rate:  [35 bmp] 35 bmp Vt Set:  [400 mL] 400 mL PEEP:  [8 cmH20] 8 cmH20 Plateau Pressure:  [17 cmH20-26 cmH20] 26 cmH20   Intake/Output Summary (Last 24 hours) at 09/19/2020 1046 Last data filed at 09/19/2020 1018 Gross per 24 hour  Intake 2796.5 ml  Output 2967 ml  Net -170.5 ml   Filed Weights   09/17/20 0456 09/18/20 0500 09/19/20 0412  Weight: 86.9 kg 86.1 kg 84.7 kg   Physical Exam: General: Chronically ill-appearing, no acute distress HENT: [place], AT,  ETT in place Eyes: EOMI, no scleral icterus Respiratory: Clear to auscultation bilaterally.  No crackles, wheezing or rales Cardiovascular: RRR, -M/R/G, no JVD Extremities:1+ lower extremity edema,-tenderness Neuro:  Sedated   Assessment & Plan:   Acute hypoxic/hypercapnic respiratory failure due to ARDS from COVID-19 pneumonia c/b HCAP and failure to wean and failed extubation 11/16 Difficulty weaning off ventilator Plan Full vent. Wean PEEP/FIO2 for goal SpO2 >88-95% PAD protocol. Wean Versed. Continue fentanyl Plan for tracheostomy Monday or early this week  Multifactorial shock, previously hemorrhagic, concerned for sepsis vs sedation related hypotension - Improved. Off pressors Cultures NGTD Plan Wean scheduled steroids off Daily CBC  Acute blood loss anemia  Last transfusion 11/15. Hg stable  Plan  Trend CBC Transfuse for goal Hg >7  Anuric AKI on CRRT Plan Cont CRRT Appreciate Nephro input  Duodenal ulcer perforation status post ex lap and Graham's patch Plan Per surg Cont routine site care  Paroxysmal A. fib, currently in sinus rhythm Plan Cont tele   Thrombocytopenia-->improving Plan Trend, transfuse if has active bleeding or <10K  Recurrent hypoglycemia at night; lantus adjusted now w/ excellent control Plan Lantus and SSI  1.9 cm cystic appearing lesion located in the pancreatic body Plan F/u outpt   Indeterminate bilateral DVT Patient had bilateral lower extremity Dopplers done, suggestive of possible indeterminate DVT, spoke with IR for IVC filter placement, after reevaluating ultrasound of lower extremities it was suggested patient does not have femoral/popliteal DVTs, so IVC filter was not placed  Best practice:  Diet: Tube feeds Pain/Anxiety/Delirium protocol (if indicated) midazolam fentanyl VAP protocol (if indicated): yes DVT prophylaxis: SCDs GI prophylaxis: Protonix BID Glucose control: Basal and bolus Mobility: Bedrest Code Status: Full Family Communication: Family updated on plan of care.  Disposition: ICU  The patient is critically ill with multiple organ systems failure and requires high complexity decision making for assessment and  support, frequent evaluation and titration of therapies, application of advanced monitoring technologies and extensive interpretation of multiple databases.  Independent Critical Care Time: 35 Minutes.   Rodman Pickle, M.D. [facility] Pulmonary/Critical Care Medicine 09/19/2020 10:47 AM   Please see Amion for pager number to reach on-call Pulmonary and Critical Care Team.

## 2020-09-19 NOTE — Progress Notes (Signed)
[facility] ROUNDING NOTE   Subjective:   Brief history 69 year old with history of diabetes hypertension hyperlipidemia colon cancer status post partial colectomy colostomy admitted 08/31/2020 with atrial fibrillation rapid ventricular rate Covid pneumonia found to have perforated duodenal ulcer underwent exploratory laparotomy developed hemorrhagic shock respiratory failure.  Creatinine 5.6 mg/dL on admission secondary to atrial fibrillation and hemodynamic instability.  CT scan did not reveal any evidence of hydronephrosis was started on CRRT 09/08/2020 for oliguric renal failure.  Blood pressure 122/63 pulse 91 temperature 96.1 O2 sats 94% FiO2 30%  Kept even on CRRT oliguric/anuric  Sodium 137 potassium 4.1 chloride 104 CO2 26 BUN 34 creatinine 0.48 glucose 190 calcium 8.6 phosphorus 1.8 magnesium 2.4 albumin 1.4 hemoglobin 7.9     Objective:  Vital signs in last 24 hours:  Temp:  [96.1 F (35.6 C)-99.4 F (37.4 C)] 96.1 F (35.6 C) (11/21 0750) Pulse Rate:  [87-120] 88 (11/21 0900) Resp:  [17-44] 26 (11/21 0900) BP: (89-127)/(53-74) 111/63 (11/21 0900) SpO2:  [92 %-100 %] 94 % (11/21 0900) FiO2 (%):  [30 %-50 %] 30 % (11/21 0805) Weight:  [84.7 kg] 84.7 kg (11/21 0412)  Weight change: -1.4 kg Filed Weights   09/17/20 0456 09/18/20 0500 09/19/20 0412  Weight: 86.9 kg 86.1 kg 84.7 kg    Intake/Output: I/O last 3 completed shifts: In: 3991.1 [I.V.:1306.1; NG/GT:2685] Out: 79 [Drains:10; Other:3716; EGBTD:1761]   Intake/Output this shift:  Total I/O In: 171.9 [I.V.:41.9; NG/GT:130] Out: 136 [Drains:5; Other:21; Stool:110]  General: Critically ill looking female intubated, sedated  Heart:RRR, s1s2 nl Lungs: Coarse breath sound posteriorly Abdomen:soft,  Extremities:No edema Dialysis Access: Right IJ temporary HD catheter placed by ICU on 11/10.   Basic Metabolic Panel: Recent Labs  Lab 09/15/20 0443 09/15/20 1552 09/16/20 0412 09/16/20 0922  09/17/20 0409 09/17/20 0409 09/17/20 0859 09/17/20 1613 09/17/20 1613 09/18/20 0330 09/18/20 1629 09/19/20 0504  NA 137   < > 137   < > 137   < > 138 136  --  136 136 137  K 4.8   < > 4.6   < > 4.4   < > 4.4 4.3  --  4.1 4.7 4.1  CL 108   < > 105   < > 105  --   --  103  --  103 103 104  CO2 25   < > 25   < > 26  --   --  26  --  _0 GLUCOSE 122*   < > 148*   < > 144*  --   --  161*  --  147* 152* 190*  BUN 24*   < > 29*   < > 26*  --   --  25*  --  25* 33* 34*  CREATININE 0.70   < > 0.62   < > 0.56  --   --  0.47  --  0.45 0.67 0.48  CALCIUM 8.4*   < > 8.4*   < > 8.6*   < >  --  8.8*   < > 8.5* 8.9 8.6*  MG 2.4  --  2.5*  --  2.5*  --   --   --   --  2.6*  --  2.4  PHOS 3.0   < > 2.8   < > 2.3*  --   --  2.2*  --  2.1* 2.2* 1.8*   < > = values in this interval not displayed.  Liver Function Tests: Recent Labs  Lab 09/13/20 0306 09/13/20 1644 09/14/20 0520 09/14/20 1618 09/15/20 0443 09/15/20 1552 09/17/20 0409 09/17/20 1613 09/18/20 0330 09/18/20 1629 09/19/20 0504  AST 232*  --  133*  --  127*  --   --   --   --   --   --   ALT 964*  --  616*  --  515*  --   --   --   --   --   --   ALKPHOS 237*  --  240*  --  287*  --   --   --   --   --   --   BILITOT 1.2  --  0.5  --  1.1  --   --   --   --   --   --   PROT 4.8*  --  4.8*  --  5.1*  --   --   --   --   --   --   ALBUMIN 1.5*   < > 1.5*   < > 1.4*   < > 1.3* 1.4* 1.3* 1.3* 1.4*   < > = values in this interval not displayed.   No results for input(s): LIPASE, AMYLASE in the last 168 hours. No results for input(s): AMMONIA in the last 168 hours.  CBC: Recent Labs  Lab 09/13/20 0306 09/14/20 0520 09/15/20 0443 09/15/20 0443 09/15/20 1119 09/15/20 1725 09/16/20 0412 09/16/20 0412 09/16/20 0922 09/17/20 0736 09/17/20 0859 09/17/20 1613 09/19/20 0504  WBC 19.5*   < > 7.6  --   --   --  19.5*  --   --  18.6*  --  18.2* 14.4*  NEUTROABS 16.9*  --   --   --   --   --   --   --   --   --   --   --    --   HGB 7.1*   < > 9.6*  --   --    < > 8.1*   < > 8.5* 6.9* 7.1* 9.0* 7.9*  HCT 21.9*   < > 30.1*  --   --    < > 25.9*   < > 25.0* 22.3* 21.0* 29.0* 25.9*  MCV 79.9*   < > 81.8  --   --   --  84.1  --   --  84.2  --  83.3 85.2  PLT 34*   < > 26*   < > 27*  --  27*  --   --  37*  --  34* 41*   < > = values in this interval not displayed.    Cardiac Enzymes: No results for input(s): CKTOTAL, CKMB, CKMBINDEX, TROPONINI in the last 168 hours.  BNP: Invalid input(s): POCBNP  CBG: Recent Labs  Lab 09/18/20 1608 09/18/20 1939 09/19/20 0000 09/19/20 0401 09/19/20 0727  GLUCAP 140* 164* 182* 183* 183*    Microbiology: Results for orders placed or performed during the hospital encounter of 08/31/20  MRSA PCR Screening     Status: None   Collection Time: 09/08/20  8:17 AM   Specimen: Nasal Mucosa; Nasopharyngeal  Result Value Ref Range Status   MRSA by PCR NEGATIVE NEGATIVE Final    Comment:        The GeneXpert MRSA Assay (FDA approved for NASAL specimens only), is one component of a comprehensive MRSA colonization surveillance program. It is not intended to diagnose MRSA infection nor to guide  or monitor treatment for MRSA infections. Performed at [facility], [place] [address] [place], [place] [address]   Culture, blood (Routine X 2) w Reflex to ID Panel     Status: None   Collection Time: 09/08/20  8:35 AM   Specimen: BLOOD LEFT HAND  Result Value Ref Range Status   Specimen Description BLOOD LEFT HAND  Final   Special Requests   Final    BOTTLES DRAWN AEROBIC ONLY Blood Culture results may not be optimal due to an inadequate volume of blood received in culture bottles   Culture   Final    NO GROWTH 5 DAYS Performed at [facility], [place] [address], [place], [place] [address]    Report Status 09/13/2020 FINAL  Final  Culture, blood (Routine X 2) w Reflex to ID Panel     Status: None   Collection Time: 09/08/20  8:35 AM   Specimen: BLOOD RIGHT HAND   Result Value Ref Range Status   Specimen Description BLOOD RIGHT HAND  Final   Special Requests   Final    BOTTLES DRAWN AEROBIC ONLY Blood Culture results may not be optimal due to an inadequate volume of blood received in culture bottles   Culture   Final    NO GROWTH 5 DAYS Performed at [facility], [place] [address], [place], [place] [address]    Report Status 09/13/2020 FINAL  Final  Culture, respiratory (non-expectorated)     Status: None   Collection Time: 09/16/20  8:35 AM   Specimen: Tracheal Aspirate; Respiratory  Result Value Ref Range Status   Specimen Description TRACHEAL ASPIRATE  Final   Special Requests NONE  Final   Gram Stain   Final    ABUNDANT WBC PRESENT, PREDOMINANTLY PMN RARE YEAST    Culture   Final    FEW Normal respiratory flora-no Staph aureus or Pseudomonas seen Performed at [facility], [address] [place], [place] [address]    Report Status 09/18/2020 FINAL  Final  Culture, respiratory (non-expectorated)     Status: None   Collection Time: 09/16/20 11:03 AM   Specimen: Bronchoalveolar Lavage; Respiratory  Result Value Ref Range Status   Specimen Description BRONCHIAL ALVEOLAR LAVAGE  Final   Special Requests NONE  Final   Gram Stain   Final    ABUNDANT WBC PRESENT, PREDOMINANTLY PMN RARE GRAM POSITIVE COCCI Performed at [facility], [place] [address], [place], [place] [address]    Culture FEW CANDIDA ALBICANS  Final   Report Status 09/18/2020 FINAL  Final    Coagulation Studies: No results for input(s): LABPROT, INR in the last 72 hours.  Urinalysis: No results for input(s): COLORURINE, LABSPEC, PHURINE, GLUCOSEU, HGBUR, BILIRUBINUR, KETONESUR, PROTEINUR, UROBILINOGEN, NITRITE, LEUKOCYTESUR in the last 72 hours.  Invalid input(s): APPERANCEUR    Imaging: No results found.   Medications:   .  prismasol BGK 4/2.5 500 mL/hr at 09/19/20 0004  .  prismasol BGK 4/2.5 500 mL/hr at 09/19/20 0250  . sodium chloride 10  mL/hr at 09/19/20 0900  . fentaNYL infusion INTRAVENOUS 100 mcg/hr (09/19/20 0900)  . midazolam 1 mg/hr (09/19/20 0900)  . norepinephrine (LEVOPHED) Adult infusion Stopped (09/18/20 1118)  . prismasol BGK 4/2.5 2,000 mL/hr at 09/19/20 0758   . sodium chloride   Intravenous Once  . B-complex with vitamin C  1 tablet Per Tube Daily  . chlorhexidine gluconate (MEDLINE KIT)  15 mL Mouth Rinse BID  . Chlorhexidine  Gluconate Cloth  6 each Topical Daily  . chlorpheniramine-HYDROcodone  5 mL Per Tube Q12H  . feeding supplement (PIVOT 1.5 CAL)  1,000 mL Per Tube Q24H  . feeding supplement (PROSource TF)  45 mL Per Tube BID  . hydrocortisone sod succinate (SOLU-CORTEF) inj  50 mg Intravenous Q8H  . insulin aspart  0-20 Units Subcutaneous Q4H  . insulin aspart  10 Units Subcutaneous Q4H  . insulin detemir  25 Units Subcutaneous QHS  . insulin detemir  35 Units Subcutaneous Daily  . mouth rinse  15 mL Mouth Rinse 10 times per day  . oxyCODONE  10 mg Per Tube Q6H  . pantoprazole sodium  40 mg Per Tube BID  . polyethylene glycol  17 g Per Tube Daily  . sodium chloride flush  10-40 mL Intracatheter Q12H   sodium chloride, acetaminophen (TYLENOL) oral liquid 160 mg/5 mL, albuterol, diphenhydrAMINE, fentaNYL, heparin, heparin, midazolam, phenol, silver nitrate applicators, sodium chloride, sodium chloride flush  Assessment/ Plan:  1.Acute kidney injury, oliguric likely ATN due to hemorrhagic shock: Patient initially had AKI with creatinine level of 5.6 on admission due to A. fib with RVR/hemodynamic instability which was improved. However she developed another episode of AKI after shock.  Urinalysis was contaminated/UTI. CT scan A/P without hydronephrosis. Started CRRT on 11/10 because of oliguria and hyperkalemia.  Tolerating well continues on pressors.  Dialysate and replacement fluids 4/2.5.  No change to current prescription.  May be able to transition 09/20/2020 to intermittent  hemodialysis.  2.Hyperkalemia due to AKI and intra-abdominal bleeding/PRBC transfusion: Now managed with CRRT.  3.Hypophosphatemia: Replete phosphate as needed  4.Hemorrhagic shock: Received blood transfusion.  Maintain MAP >65.  Pressors appear to have been weaned.  5.Perforated duodenal ulcer status post ex lap: General surgery is following. Currently on tube feed.  6.ARDS/recent Covid pneumonia: Currently on mechanical ventilation, prone position. Per primary team.   LOS: [place] _0 _1 :32 AM

## 2020-09-19 NOTE — Progress Notes (Deleted)
eLink Physician-Brief Progress Note Patient Name: Moana Munford DOB: Jun 04, 1951 MRN: [id number]   Date of Service  09/19/2020  HPI/Events of Note  Frequent liquid stool - Nursing request for Flexiseal.   eICU Interventions  Will order Flexiseal placed.      Intervention Category Major Interventions: Other:  Lysle Dingwall 09/19/2020, 10:50 PM

## 2020-09-20 DIAGNOSIS — K265 Chronic or unspecified duodenal ulcer with perforation: Secondary | ICD-10-CM

## 2020-09-20 DIAGNOSIS — Z515 Encounter for palliative care: Secondary | ICD-10-CM

## 2020-09-20 DIAGNOSIS — J9601 Acute respiratory failure with hypoxia: Secondary | ICD-10-CM | POA: Diagnosis not present

## 2020-09-20 DIAGNOSIS — Z9911 Dependence on respirator [ventilator] status: Secondary | ICD-10-CM

## 2020-09-20 DIAGNOSIS — Z7189 Other specified counseling: Secondary | ICD-10-CM

## 2020-09-20 DIAGNOSIS — I4891 Unspecified atrial fibrillation: Secondary | ICD-10-CM | POA: Diagnosis not present

## 2020-09-20 DIAGNOSIS — J9602 Acute respiratory failure with hypercapnia: Secondary | ICD-10-CM

## 2020-09-20 DIAGNOSIS — U071 COVID-19: Secondary | ICD-10-CM | POA: Diagnosis not present

## 2020-09-20 DIAGNOSIS — J80 Acute respiratory distress syndrome: Secondary | ICD-10-CM | POA: Diagnosis not present

## 2020-09-20 LAB — RENAL FUNCTION PANEL
Albumin: 1.3 g/dL — ABNORMAL LOW (ref 3.5–5.0)
Albumin: 1.4 g/dL — ABNORMAL LOW (ref 3.5–5.0)
Anion gap: 8 (ref 5–15)
Anion gap: 10 (ref 5–15)
BUN: 27 mg/dL — ABNORMAL HIGH (ref 8–23)
BUN: 27 mg/dL — ABNORMAL HIGH (ref 8–23)
CO2: 26 mmol/L (ref 22–32)
CO2: 25 mmol/L (ref 22–32)
Calcium: 8.5 mg/dL — ABNORMAL LOW (ref 8.9–10.3)
Calcium: 8.2 mg/dL — ABNORMAL LOW (ref 8.9–10.3)
Chloride: 102 mmol/L (ref 98–111)
Chloride: 99 mmol/L (ref 98–111)
Creatinine, Ser: 0.46 mg/dL (ref 0.44–1.00)
Creatinine, Ser: 0.43 mg/dL — ABNORMAL LOW (ref 0.44–1.00)
GFR, Estimated: >60 mL/min (ref >60)
GFR, Estimated: >60 mL/min (ref >60)
Glucose, Bld: 134 mg/dL — ABNORMAL HIGH (ref 70–99)
Glucose, Bld: 277 mg/dL — ABNORMAL HIGH (ref 70–99)
Phosphorus: 2.1 mg/dL — ABNORMAL LOW (ref 2.5–4.6)
Phosphorus: 5.1 mg/dL — ABNORMAL HIGH (ref 2.5–4.6)
Potassium: 4.8 mmol/L (ref 3.5–5.1)
Potassium: 6 mmol/L — ABNORMAL HIGH (ref 3.5–5.1)
Sodium: 136 mmol/L (ref 135–145)
Sodium: 134 mmol/L — ABNORMAL LOW (ref 135–145)

## 2020-09-20 LAB — BASIC METABOLIC PANEL
Anion gap: 7 (ref 5–15)
BUN: 28 mg/dL — ABNORMAL HIGH (ref 8–23)
CO2: 26 mmol/L (ref 22–32)
Calcium: 8.4 mg/dL — ABNORMAL LOW (ref 8.9–10.3)
Chloride: 100 mmol/L (ref 98–111)
Creatinine, Ser: 0.49 mg/dL (ref 0.44–1.00)
GFR, Estimated: >60 mL/min (ref >60)
Glucose, Bld: 208 mg/dL — ABNORMAL HIGH (ref 70–99)
Potassium: 5.4 mmol/L — ABNORMAL HIGH (ref 3.5–5.1)
Sodium: 133 mmol/L — ABNORMAL LOW (ref 135–145)

## 2020-09-20 LAB — GLUCOSE, CAPILLARY
Glucose-Capillary: 118 mg/dL — ABNORMAL HIGH (ref 70–99)
Glucose-Capillary: 125 mg/dL — ABNORMAL HIGH (ref 70–99)
Glucose-Capillary: 133 mg/dL — ABNORMAL HIGH (ref 70–99)
Glucose-Capillary: 140 mg/dL — ABNORMAL HIGH (ref 70–99)
Glucose-Capillary: 151 mg/dL — ABNORMAL HIGH (ref 70–99)
Glucose-Capillary: 163 mg/dL — ABNORMAL HIGH (ref 70–99)
Glucose-Capillary: 228 mg/dL — ABNORMAL HIGH (ref 70–99)
Glucose-Capillary: 30 mg/dL — CL (ref 70–99)

## 2020-09-20 LAB — IRON AND TIBC
Iron: 44 ug/dL (ref 28–170)
Saturation Ratios: 15 % (ref 10.4–31.8)
TIBC: 302 ug/dL (ref 250–450)
UIBC: 258 ug/dL

## 2020-09-20 LAB — DIFFERENTIAL
Abs Immature Granulocytes: 0.57 10*3/uL — ABNORMAL HIGH (ref 0.00–0.07)
Basophils Absolute: 0 10*3/uL (ref 0.0–0.1)
Basophils Relative: 0 %
Eosinophils Absolute: 0.1 10*3/uL (ref 0.0–0.5)
Eosinophils Relative: 1 %
Immature Granulocytes: 6 %
Lymphocytes Relative: 5 %
Lymphs Abs: 0.5 10*3/uL — ABNORMAL LOW (ref 0.7–4.0)
Monocytes Absolute: 0.8 10*3/uL (ref 0.1–1.0)
Monocytes Relative: 8 %
Neutro Abs: 7.3 10*3/uL (ref 1.7–7.7)
Neutrophils Relative %: 80 %

## 2020-09-20 LAB — CBC
HCT: 26 % — ABNORMAL LOW (ref 36.0–46.0)
Hemoglobin: 8 g/dL — ABNORMAL LOW (ref 12.0–15.0)
MCH: 26.1 pg (ref 26.0–34.0)
MCHC: 30.8 g/dL (ref 30.0–36.0)
MCV: 85 fL (ref 80.0–100.0)
Platelets: 47 10*3/uL — ABNORMAL LOW (ref 150–400)
RBC: 3.06 MIL/uL — ABNORMAL LOW (ref 3.87–5.11)
RDW: 20.7 % — ABNORMAL HIGH (ref 11.5–15.5)
WBC: 9.3 10*3/uL (ref 4.0–10.5)
nRBC: 16.6 % — ABNORMAL HIGH (ref 0.0–0.2)

## 2020-09-20 LAB — FERRITIN: Ferritin: 1638 ng/mL — ABNORMAL HIGH (ref 11–307)

## 2020-09-20 LAB — TRIGLYCERIDES: Triglycerides: 99 mg/dL (ref <150)

## 2020-09-20 LAB — PREALBUMIN: Prealbumin: 18.5 mg/dL (ref 18–38)

## 2020-09-20 LAB — PHOSPHORUS: Phosphorus: 2 mg/dL — ABNORMAL LOW (ref 2.5–4.6)

## 2020-09-20 LAB — MAGNESIUM: Magnesium: 2.4 mg/dL (ref 1.7–2.4)

## 2020-09-20 MED ORDER — POTASSIUM PHOSPHATES 15 MMOLE/5ML IV SOLN
20.0000 mmol | Freq: Once | INTRAVENOUS | Status: AC
  Administered 2020-09-20: 20 mmol via INTRAVENOUS
  Filled 2020-09-20: qty 6.67

## 2020-09-20 MED ORDER — DEXTROSE 50 % IV SOLN
INTRAVENOUS | Status: AC
  Administered 2020-09-20: 50 mL
  Filled 2020-09-20: qty 50

## 2020-09-20 MED ORDER — PRISMASOL BGK 0/2.5 32-2.5 MEQ/L EC SOLN
Status: DC
  Filled 2020-09-20 (×12): qty 5000

## 2020-09-20 MED ORDER — DEXMEDETOMIDINE HCL IN NACL 400 MCG/100ML IV SOLN
0.4000 ug/kg/h | INTRAVENOUS | Status: DC
  Administered 2020-09-20: 0.4 ug/kg/h via INTRAVENOUS
  Filled 2020-09-20: qty 100

## 2020-09-20 NOTE — Consult Note (Addendum)
Consultation Note Date: 09/20/2020   Patient Name: Candace [name]  DOB: 11-06-1950  MRN: [id number]  Age / Sex: 69 y.o., female  PCP: Algis Greenhouse, MD Referring Physician: Garner Nash, DO  Reason for Consultation: Establishing goals of care  HPI/Patient Profile: 69 y.o. female  with past medical history of HTN, HLD, DM, recent COVID-19 infection 08/23/20 admitted on 08/31/2020 with hypoxia and atrial fibrillation RVR. Admitted to medical floor and started on Cardizem infusion and Eliquis. Hospital course complicated by abdominal pain, found to have perforated duodenal ulcer s/p explaratory lap, lysis of adhesions, Graham patch placement. Developed bleeding from surgical site post op day 6. 11/10 ICU transfer due to hemorrhagic shock, intubated, transfused, pressors, proned, and started on CRRT. Extubated 11/16 and re-intubated that evening due to respiratory distress, hypoxia, and altered mental status. Remains critically ill, intubated on mechanical ventilation. Ongoing CRRT with plans to attempt iHD per nephrology. Palliative medicine consultation for goals of care.   Clinical Assessment and Goals of Care:  I have reviewed medical records, discussed with care team, and met with daughter [name]) in conference room to discuss goals of care. Another daughter, Jarrett Ables, and niece on Facetime. Patient remains intubated, sedated, on CRRT. Unable to participate in conversation. She will open eyes to voice and follow simple commands intermittently.   I introduced Palliative Medicine as specialized medical care for people living with serious illness. It focuses on providing relief from the symptoms and stress of a serious illness. The goal is to improve quality of life for both the patient and the family.  We discussed a brief life review of the patient. Prior to admission, daughter shares her mother was independent,  active and primary caregiver for her husband with dementia. [name] describes her mother as 'energetic.' Five children with 40+ grandchildren and great-grandchildren. Her family is very important to her. 32 of family lives in [place].   Discussed in detail events leading up to admission and course of hospitalization including diagnoses, interventions, plan of care. [name] is appreciative of conversation with Dr. Valeta Harms this morning. She does share concerns about her mother possibly needed placement out of state following this hospitalization. Discussed in detail expectations of care with ongoing aggressive medical management including trach/vent (if unable to wean) and peg tube placement. Discussed barriers to discharge including the need for ongoing dialysis. Discussed that her mother is significantly weak and deconditioned now day 20 hospitalized and if she can tolerate trach/vent without sedation, will she be able to work and progress with therapy? Discussed TOC involvement to evaluate local LTACH placement and if not, search will have to be expanded for vent SNF facility. [name] prefers if her mother is not approved locally, to pursue facilities in the [place] area where many family members are located. Answered many questions for family regarding hospital course and complications.   I attempted to elicit values and goals of care important to the patient and family. [name] reports her mother does not have a documented living will but prior to going into  surgery this admission, the patient spoke of her desire to "live" to her daughter and to "bring me back" if her condition worsened. 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 [name] shares they have a large, supportive family with many healthcare professionals and willing to do whatever is necessary to care for [place].    Discussed plan moving forward with decision to pursue trach--TOC involvement for possible LTACH placement, eventually will need PEG tube placement but ? If surgery will approve this yet with recent perforated duodenal ulcer, time for outcomes once transitioned to [facility], her ability to tolerate trach/vent once sedation is weaned, and also her ability to tolerate and progress with therapy. Discussed the long journey moving forward and high unlikelihood she will return home, independent as before admission. Encouraged ongoing discussions as a family as time goes on, regarding quality of life.   Answered all questions to the best of my ability. Therapeutic listening and emotional support provided. Reassured of ongoing palliative support this admission. PMT contact information given.   Updated Dr. Valeta Harms and RN.     SUMMARY OF RECOMMENDATIONS    Continue full code/full scope treatment following [place] discussion.   Family desires any offered and available medical interventions to prolong life at this time, including trach/PEG placement. Patient does not have a documented living will but has previously spoken to her daughter of the desire to live and have resuscitation attempted if necessary.   TOC team referral for [facility] eligibility. Daughter hopeful for local placement. If not, daughter requesting to expand bed search near [place] where many family members reside.   Ongoing palliative support and discussions this admission.   Code Status/Advance Care Planning:  Full code  Symptom Management:   Per attending  Palliative Prophylaxis:   Aspiration, Delirium Protocol, Frequent Pain Assessment, Oral Care and Turn Reposition  Additional Recommendations (Limitations, Scope, Preferences):  Full Scope Treatment  Psycho-social/Spiritual:   Desire for further Chaplaincy support:yes  Additional Recommendations: Caregiving  Support/Resources and Compassionate Wean Education  Prognosis:    Poor prognosis  Discharge Planning: To Be Determined      Primary Diagnoses: Present on Admission: . New onset a-fib ([place]) . Atrial fibrillation with RVR ([place]) . Leukocytosis . Atrial fibrillation with rapid ventricular response ([place])   I have reviewed the medical record, interviewed the patient and family, and examined the patient. The following aspects are pertinent.  Past Medical History:  Diagnosis Date  . Colon cancer ([place])   . Diabetes mellitus ([place])   . Hyperlipemia   . Hypertension    Social History   Socioeconomic History  . Marital status: Married    Spouse name: Not on file  . Number of children: Not on file  . Years of education: Not on file  . Highest education level: Not on file  Occupational History  . Not on file  Tobacco Use  . Smoking status: Never Smoker  . Smokeless tobacco: Never Used  Vaping Use  . Vaping Use: Never used  Substance and Sexual Activity  . Alcohol use: Never  . Drug use: Never  . Sexual activity: Not on file  Other Topics Concern  . Not on file  Social History Narrative  . Not on file   Social Determinants of Health   Financial Resource Strain:   . Difficulty of Paying Living Expenses: Not on file  Food Insecurity:   . Worried About  Running Out of Food in the Last Year: Not on file  . Ran Out of Food in the Last Year: Not on file  Transportation Needs:   . Lack of Transportation (Medical): Not on file  . Lack of Transportation (Non-Medical): Not on file  Physical Activity:   . Days of Exercise per Week: Not on file  . Minutes of Exercise per Session: Not on file  Stress:   . Feeling of Stress : Not on file  Social Connections:   . Frequency of Communication with Friends and Family: Not on file  . Frequency of Social Gatherings with Friends and Family: Not on file  . Attends Religious Services: Not on file  . Active Member of Clubs or Organizations: Not on file  . Attends Archivist Meetings: Not on  file  . Marital Status: Not on file   Family History  Problem Relation Age of Onset  . Clotting disorder Mother        "died of a blood clot"  . Alzheimer's disease Father    Scheduled Meds: . sodium chloride   Intravenous Once  . B-complex with vitamin C  1 tablet Per Tube Daily  . chlorhexidine gluconate (MEDLINE KIT)  15 mL Mouth Rinse BID  . Chlorhexidine Gluconate Cloth  6 each Topical Daily  . chlorpheniramine-HYDROcodone  5 mL Per Tube Q12H  . feeding supplement (PIVOT 1.5 CAL)  1,000 mL Per Tube Q24H  . feeding supplement (PROSource TF)  45 mL Per Tube BID  . hydrocortisone sod succinate (SOLU-CORTEF) inj  50 mg Intravenous Daily  . insulin aspart  0-20 Units Subcutaneous Q4H  . insulin aspart  10 Units Subcutaneous Q4H  . insulin detemir  25 Units Subcutaneous QHS  . insulin detemir  35 Units Subcutaneous Daily  . mouth rinse  15 mL Mouth Rinse 10 times per day  . oxyCODONE  10 mg Per Tube Q6H  . pantoprazole sodium  40 mg Per Tube BID  . polyethylene glycol  17 g Per Tube Daily  . sodium chloride flush  10-40 mL Intracatheter Q12H   Continuous Infusions: .  prismasol BGK 4/2.5 500 mL/hr at 09/20/20 0707  .  prismasol BGK 4/2.5 500 mL/hr at 09/20/20 0956  . sodium chloride Stopped (09/19/20 1731)  . dexmedetomidine (PRECEDEX) IV infusion 0.4 mcg/kg/hr (09/20/20 1648)  . fentaNYL infusion INTRAVENOUS 250 mcg/hr (09/20/20 1600)  . norepinephrine (LEVOPHED) Adult infusion 5 mcg/min (09/20/20 1600)  . prismasol BGK 4/2.5 2,000 mL/hr at 09/20/20 1436   PRN Meds:.sodium chloride, acetaminophen (TYLENOL) oral liquid 160 mg/5 mL, albuterol, diphenhydrAMINE, fentaNYL, heparin, heparin, midazolam, phenol, silver nitrate applicators, sodium chloride, sodium chloride flush Medications Prior to Admission:  Prior to Admission medications   Medication Sig Start Date End Date Taking? Authorizing Provider  acetaminophen (TYLENOL) 500 MG tablet Take 1,000 mg by mouth every 6 (six)  hours as needed for mild pain.   Yes [provider]  albuterol (VENTOLIN HFA) 108 (90 Base) MCG/ACT inhaler Inhale 2 puffs into the lungs every 6 (six) hours as needed for wheezing or shortness of breath. 08/29/20  Yes Thurnell Lose, MD  amLODipine (NORVASC) 10 MG tablet Take 1 tablet (10 mg total) by mouth daily. 08/29/20 08/29/21 Yes Thurnell Lose, MD  apixaban (ELIQUIS) 2.5 MG TABS tablet Take 1 tablet (2.5 mg total) by mouth 2 (two) times daily. 08/29/20  Yes Thurnell Lose, MD  aspirin 81 MG EC tablet Take 81 mg by mouth daily. 06/20/18  Yes [provider]  levofloxacin (LEVAQUIN) 250 MG tablet Take 1 tablet (250 mg total) by mouth daily. 08/29/20  Yes Thurnell Lose, MD  metFORMIN (GLUCOPHAGE-XR) 500 MG 24 hr tablet Take 500 mg by mouth in the morning and at bedtime.  07/16/20  Yes [provider]  rosuvastatin (CRESTOR) 40 MG tablet Take 40 mg by mouth daily. 06/22/20  Yes [provider]  zinc gluconate 50 MG tablet Take 50 mg by mouth daily.   Yes [provider]   No Known Allergies Review of Systems  Unable to perform ROS: Acuity of condition   Physical Exam Vitals and nursing note reviewed.  Constitutional:      Appearance: She is ill-appearing.     Interventions: She is sedated and intubated.  HENT:     Head: Normocephalic and atraumatic.  Cardiovascular:     Rate and Rhythm: Rhythm irregularly irregular.  Pulmonary:     Effort: No tachypnea, accessory muscle usage or respiratory distress. She is intubated.  Skin:    General: Skin is warm and dry.  Neurological:     Mental Status: She is easily aroused.     Comments: Sedated. Opens eyes to voice. Follows simple commands at times     Vital Signs: BP 139/78   Pulse (!) 106   Temp (!) 97.3 F (36.3 C)   Resp (!) 23   Ht 5' 8"  (1.727 m)   Wt 85.1 kg   SpO2 97%   BMI 28.53 kg/m  Pain Scale: CPOT POSS *See Group Information*: 1-Acceptable,Awake and  alert Pain Score: 0-No pain   SpO2: SpO2: 97 % O2 Device:SpO2: 97 % O2 Flow Rate: .O2 Flow Rate (L/min): 15 L/min  IO: Intake/output summary:   Intake/Output Summary (Last 24 hours) at 09/20/2020 1653 Last data filed at 09/20/2020 1600 Gross per 24 hour  Intake 2815.42 ml  Output 2865 ml  Net -49.58 ml    LBM: Last BM Date: 09/20/20 Baseline Weight: Weight: 99.7 kg Most recent weight: Weight: 85.1 kg     Palliative Assessment/Data: PPS 30%   Flowsheet Rows     Most Recent Value  Intake Tab  Referral Department Critical care  Unit at Time of Referral ICU  Palliative Care Primary Diagnosis Other (Comment)  Palliative Care Type New Palliative care  Reason for referral Clarify Goals of Care  Date first seen by Palliative Care 09/20/20  Clinical Assessment  Palliative Performance Scale Score 30%  Psychosocial & Spiritual Assessment  Palliative Care Outcomes  Patient/Family meeting held? Yes  Who was at the meeting? daughter in family room. Daughter and neice on facetime  Palliative Care Outcomes Clarified goals of care, Provided end of life care assistance, Provided psychosocial or spiritual support, ACP counseling assistance      Time Total: 20mn Greater than 50%  of this time was spent counseling and coordinating care related to the above assessment and plan.  Signed by:  MIhor Dow DNP, FNP-C Palliative Medicine Team  Phone: [phone]: [phone]  Please contact Palliative Medicine Team phone at [phone] questions and concerns.  For individual provider: See AShea Evans

## 2020-09-20 NOTE — Progress Notes (Signed)
Wasted 40ml of versed infusion in the sink. Witnessed by Clarita Leber, RN.

## 2020-09-20 NOTE — Progress Notes (Signed)
Progress Note  19 Days Post-Op  Subjective: Sedated on the vent. Remains surgically stable from an abdominal standpoint.   Objective: Vital signs in last 24 hours: Temp:  [95.4 F (35.2 C)-97.9 F (36.6 C)] 95.9 F (35.5 C) (11/22 1100) Pulse Rate:  [68-108] 87 (11/22 1100) Resp:  [18-35] 22 (11/22 1100) BP: (73-144)/(49-81) 88/64 (11/22 1045) SpO2:  [83 %-100 %] 93 % (11/22 1134) FiO2 (%):  [30 %-50 %] 50 % (11/22 1134) Weight:  [85.1 kg] 85.1 kg (11/22 0418) Last BM Date: 09/20/20  Intake/Output from previous day: 11/21 0701 - 11/22 0700 In: 2903.2 [I.V.:549.9; NG/GT:1820; IV Piggyback:533.3] Out: 2906 [Drains:30; Stool:585] Intake/Output this shift: Total I/O In: 608.9 [I.V.:134.3; NG/GT:360; IV Piggyback:114.6] Out: 108 [Other:658]  PE: JIR:CVELFYB on the vent HEENT:OG in place Card: RRR, feet WWP bilaterally Pulm:Mechanically ventilated OFB:PZWC, ND, ileostomy with stool output, midline wound improved from last week with granulation tissue present and minimal old hematoma in base, JP with minimal serosanguinous drainage  HEN:IDPOEU soft   Lab Results:  Recent Labs    09/19/20 0504 09/20/20 0428  WBC 14.4* 9.3  HGB 7.9* 8.0*  HCT 25.9* 26.0*  PLT 41* 47*   BMET Recent Labs    09/19/20 1629 09/20/20 0428  NA 135 136  K 4.8 4.8  CL 103 102  CO2 26 26  GLUCOSE 204* 134*  BUN 30* 27*  CREATININE 0.52 0.46  CALCIUM 8.6* 8.5*   PT/INR No results for input(s): LABPROT, INR in the last 72 hours. CMP     Component Value Date/Time   NA 136 09/20/2020 0428   K 4.8 09/20/2020 0428   CL 102 09/20/2020 0428   CO2 26 09/20/2020 0428   GLUCOSE 134 (H) 09/20/2020 0428   BUN 27 (H) 09/20/2020 0428   CREATININE 0.46 09/20/2020 0428   CALCIUM 8.5 (L) 09/20/2020 0428   PROT 5.1 (L) 09/15/2020 0443   ALBUMIN 1.3 (L) 09/20/2020 0428   AST 127 (H) 09/15/2020 0443   ALT 515 (H) 09/15/2020 0443   ALKPHOS 287 (H) 09/15/2020 0443   BILITOT 1.1  09/15/2020 0443   GFRNONAA >60 09/20/2020 0428   Lipase     Component Value Date/Time   LIPASE 48 08/31/2020 2054       Studies/Results: No results found.  Anti-infectives: Anti-infectives (From admission, onward)   Start     Dose/Rate Route Frequency Ordered Stop   09/12/20 2200  ceFEPIme (MAXIPIME) 2 g in sodium chloride 0.9 % 100 mL IVPB        2 g 200 mL/hr over 30 Minutes Intravenous Every 12 hours 09/12/20 0732 09/14/20 2134   09/11/20 1400  ceFEPIme (MAXIPIME) 2 g in sodium chloride 0.9 % 100 mL IVPB  Status:  Discontinued        2 g 200 mL/hr over 30 Minutes Intravenous Every 8 hours 09/11/20 1126 09/12/20 0732   09/08/20 1000  vancomycin (VANCOREADY) IVPB 2000 mg/400 mL        2,000 mg 200 mL/hr over 120 Minutes Intravenous  Once 09/08/20 0857 09/08/20 1224   09/08/20 1000  ceFEPIme (MAXIPIME) 2 g in sodium chloride 0.9 % 100 mL IVPB  Status:  Discontinued        2 g 200 mL/hr over 30 Minutes Intravenous Every 12 hours 09/08/20 0857 09/11/20 1126   09/08/20 0856  vancomycin variable dose per unstable renal function (pharmacist dosing)  Status:  Discontinued         Does not apply See admin  instructions 09/08/20 0857 09/09/20 0935   09/02/20 1600  cefTRIAXone (ROCEPHIN) 1 g in sodium chloride 0.9 % 100 mL IVPB  Status:  Discontinued        1 g 200 mL/hr over 30 Minutes Intravenous Every 24 hours 09/01/20 1811 09/02/20 0838   09/01/20 1800  fluconazole (DIFLUCAN) IVPB 400 mg        400 mg 50 mL/hr over 240 Minutes Intravenous  Once 09/01/20 1749 09/02/20 0603   09/01/20 1530  piperacillin-tazobactam (ZOSYN) IVPB 3.375 g        3.375 g 12.5 mL/hr over 240 Minutes Intravenous Every 8 hours 09/01/20 1514 09/05/20 2111   09/01/20 1000  levofloxacin (LEVAQUIN) tablet 250 mg  Status:  Discontinued        250 mg Oral Daily 08/31/20 1508 08/31/20 1735   08/31/20 1730  cefTRIAXone (ROCEPHIN) 1 g in sodium chloride 0.9 % 100 mL IVPB  Status:  Discontinued        1 g 200  mL/hr over 30 Minutes Intravenous Every 24 hours 08/31/20 1726 09/01/20 1513   08/31/20 1730  azithromycin (ZITHROMAX) 500 mg in sodium chloride 0.9 % 250 mL IVPB  Status:  Discontinued        500 mg 250 mL/hr over 60 Minutes Intravenous Every 24 hours 08/31/20 1726 09/02/20 0838       Assessment/Plan HTN HLD DM Hx colon cancer s/p partial colectomy/colostomy followed by completion colectomy and ileostomy Covid-19 pneumonia  Elevated D dimer on eliquis A fib RVR Anemia 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 Bobbye Morton -POD#18 - continue PPI BID - UGI negative for leak11/8 - JP with SS fluid - remove today  - tolerating TF - BID wet to dry dressing changes to midline abdominal wound, making sure to debride any loose hematoma - appears improved today  - CT abdomen/pelvis 11/10 stable without evidence of postop complication - surgically stable will see again next week, please call if surgical concerns arise prior to then  ID -zosyn 11/3>>11/8.vancomycin 11/10. Maxipime11/10>11/16 VTE -heparin gtt FEN -OG, TF @ 65cc/hr Foley -replaced 11/9 Follow up -Dr. Chrystie Nose - daughter [name] [phone]  LOS: 19 days    Norm Parcel , [facility] 09/20/2020, 11:39 AM Please see Amion for pager number during day hours 7:00am-4:30pm

## 2020-09-20 NOTE — Progress Notes (Signed)
Hypoglycemic Event  CBG: 30  Treatment: 1 amp D50  Symptoms: unknown  Follow-up CBG: Time: 0140 CBG Result: 140  Possible Reasons for Event: NPO   Restarted tubefeed    Harless Litten

## 2020-09-20 NOTE — Progress Notes (Signed)
[facility] Progress Note  Name: Candace [name] MRN: [id number] DOB: 11/23/1950  Subjective:  She had 2.3 liters UF with CRRT over 11/21. No UOP charted.  Some desats today - spoke with nursing.  She's been off of pressors - went to 70's when had a bolus of sedation?  Spoke with team and she is to get a trach today  Review of systems:  Unable to obtain 2/2 AMS   Intake/Output Summary (Last 24 hours) at 09/20/2020 0826 Last data filed at 09/20/2020 0800 Gross per 24 hour  Intake 2911.83 ml  Output 2859 ml  Net 52.83 ml    Vitals:  Vitals:   09/20/20 0745 09/20/20 0800 09/20/20 0815 09/20/20 0819  BP:  (!) 78/55  110/67  Pulse: 95 91 89 90  Resp: (!) 32 (!) 35 (!) 35 (!) 35  Temp: (!) 95.5 F (35.3 C) (!) 95.4 F (35.2 C) (!) 95.4 F (35.2 C) (!) 95.4 F (35.2 C)  TempSrc:      SpO2: (!) 83% 96% 100% 100%  Weight:      Height:         Physical Exam:  General adult female in bed critically ill  HEENT normocephalic atraumatic Neck supple trachea midline Lungs clear to auscultation anteriorly on vent  Heart S1S2 no rub appreciated Abdomen soft obese habitus Extremities no pitting edema  Neuro - sedation currently running Access left chest temp catheter    Medications reviewed   Labs:  BMP Latest Ref Rng & Units 09/20/2020 09/19/2020 09/19/2020  Glucose 70 - 99 mg/dL 134(H) 204(H) 190(H)  BUN 8 - 23 mg/dL 27(H) 30(H) 34(H)  Creatinine 0.44 - 1.00 mg/dL 0.46 0.52 0.48  Sodium 135 - 145 mmol/L 136 135 137  Potassium 3.5 - 5.1 mmol/L 4.8 4.8 4.1  Chloride 98 - 111 mmol/L 102 103 104  CO2 22 - 32 mmol/L 26 26 26   Calcium 8.9 - 10.3 mg/dL 8.5(L) 8.6(L) 8.6(L)     Assessment/Plan:   1.Acute kidney injury, oliguric likely ATN due to hemorrhagic shock: - Patient initially had AKI with creatinine level of 5.6 on admission due to A. fib with RVR/hemodynamic instability which was improved. However she developed another episode of AKI after shock.   Urinalysis was contaminated/UTI. CT scan A/P without hydronephrosis. Started CRRT on 11/10 because of oliguria and hyperkalemia.   - may be able to transition to iHD soon  - For today, will stop CRRT when cartridge expires or circuit clots   2.Hyperkalemia due to AKI and intra-abdominal bleeding/PRBC transfusion: improved with CRRT.  3.Hypophosphatemia: continue to replete phos as needed; replete again today    4.Hemorrhagic shock: s/p PRBC's.  Maintain MAP >65  5.Perforated duodenal ulcer status post ex lap: per general surgery. Currently on tube feeds.  6.ARDS/recent Covid pneumonia: mechanical ventilation per primary team   7. Anemia 2/2 AKI and hemorrhagic shock - add on iron panel   Claudia Desanctis, MD 09/20/2020  8:49 AM

## 2020-09-20 NOTE — Progress Notes (Signed)
Repeat renal profile K+  5.4 result called to Dr Ambrose Pancoast. Orders received to change Dialysate Solution

## 2020-09-20 NOTE — Progress Notes (Addendum)
Inpatient Diabetes Program Recommendations  AACE/ADA: New Consensus Statement on Inpatient Glycemic Control (2015)  Target Ranges:  Prepandial:   less than 140 mg/dL      Peak postprandial:   less than 180 mg/dL (1-2 hours)      Critically ill patients:  140 - 180 mg/dL   Lab Results  Component Value Date   GLUCAP 133 (H) 09/20/2020   HGBA1C 7.0 (H) 08/24/2020    Review of Glycemic Control Results for Candace [name], Candace [name] (MRN [id number]) as of 09/20/2020 11:51  Ref. Range 09/20/2020 01:15 09/20/2020 01:42 09/20/2020 03:40 09/20/2020 07:19 09/20/2020 11:15  Glucose-Capillary Latest Ref Range: 70 - 99 mg/dL 30 (LL) 140 (H) 118 (H) 151 (H) 133 (H)  Diabetes history:  T2DM Current orders for Inpatient glycemic control:  Levemir 35 units q AM and Levemir 25 units q PM Novolog 10 units q4h Novolog 0-20 units q4h  Inpatient Diabetes Program Recommendations:    Please consider reducing Levemir to 25 units bid.  Also recommend addition of D10 at 40 cc/hr if tube feeds held for any reason to prevent hypoglycemia.    Thanks  Adah Perl, RN, BC-ADM Inpatient Diabetes Coordinator Pager [phone] (8a-5p)

## 2020-09-20 NOTE — TOC Progression Note (Signed)
Transition of Care [facility]) - Progression Note    Patient Details  Name: Candace [name] MRN: [id number] Date of Birth: [birth year]  [facility] CM/SW Contact  Bartholomew Crews, RN Phone Number: [phone] 09/20/2020, 3:48 PM  Clinical Narrative:     Spoke with patient's daughter, Candace [name] [phone]), to discuss LTAC options. Discussed local LTACs in [place], [place] and [facility]. Discussed barriers to long term placement to include vent, trach, and hemodialysis needs, and limited placement options. Referral sent to [facility] and [facility] - no bed availability at this time. LTACs continuing to follow.   PTA patient was independent, driving, and caring for her husband who has dementia. Family is taking care of him at this time.   TOC following for transition needs.   Expected Discharge Plan: Long Term Acute Care (LTAC) Barriers to Discharge: Continued Medical Work up  Expected Discharge Plan and Services Expected Discharge Plan: Long Term Acute Care (LTAC)   Discharge Planning Services: CM Consult Post Acute Care Choice: Long Term Acute Care (LTAC) Living arrangements for the past 2 months: Single Family Home                 DME Arranged: N/A DME Agency: NA       HH Arranged: NA HH Agency: NA         Social Determinants of Health (SDOH) Interventions    Readmission Risk Interventions No flowsheet data found.

## 2020-09-20 NOTE — Progress Notes (Signed)
Assisted tele visit to patient with family member.  Thomas, Gavyn Zoss Renee, RN   

## 2020-09-20 NOTE — Plan of Care (Signed)
Spoke with RN.  PM CRRT Labs obtained through line which had been used for k-phos.  Will repeat BMP and renal will adjust CRRT fluids accordingly.  Currently on all 4K fluids.  Spoke with on-call nephrology to notify of the ordered labs  Claudia Desanctis, MD 5:07 PM 09/20/2020

## 2020-09-20 NOTE — Progress Notes (Signed)
PCCM:  I called and spoke with the patients daughter regarding tracheostomy.   She wants to speak with additional siblings before making a decision.   Garner Nash, DO [facility] 09/20/2020 9:18 AM

## 2020-09-20 NOTE — Progress Notes (Signed)
Assisted tele visit to patient with family member.  Thomas, Everlynn Sagun Renee, RN   

## 2020-09-20 NOTE — Progress Notes (Signed)
NAMETinisha [name], MRN:  [id number], DOB:  Jun 30, 1951, LOS: 42 ADMISSION DATE:  08/31/2020, CONSULTATION DATE:  09/07/2020 REFERRING MD:  Dr Candiss Norse, CHIEF COMPLAINT:  Acute resp failure  Brief History   69 year old female who was previously diagnosed with Covid 08/23/2020.  Admitted 11/2 with AF-RVR, found to have a perforated duodenal underwent exploratory laparotomy with Phillip Heal patch placement.  Past Medical History  Covid pneumonia Atrial fibrillation CKD stage III Diabetes mellitus Hypertension Colon cancer Hyperlipidemia  Significant Hospital Events   11/2 Admitted  11/3 OR -> perforated duodenal ulcer 11/10 progressive hemorrhagic shock, intubated, transfused, pressors, proned; started on CRRT in PM 11/16 Extubated. Re-intubated overnight due to respiratory distress and hypoxia with decreased mentation 11/18  bronch'd/ cultures sent 11/19 hgb down getting blood  Consults:  Cardiology [facility] Nephrology   Procedures:  11/3 Exploratory laparotomy, Phillip Heal patch, lysis of adhesion for duodenal ulceration postop day 6  R PICC 11/5 >> A line 11/9 >> ETT 11/9 > 11/16, 11/16 >>  Lt [place] CVL 11/9 >> R IJ trialysis >>   Significant Diagnostic Tests:  11/3 CT abd/ pelvis > 1. Positive for bowel perforation: Pneumoperitoneum and intermediate density free fluid in the abdomen. Prior total colectomy. The specific site of perforation is unclear-oral contrast present to the proximal jejunum has not obviously leaked. Note that there may be small bowel loops adherent to the ventral abdominal wall along the greater curve of the stomach. 2. Extensive bilateral lower lung pneumonia. No pleural effusion. 3. Other abdominal and pelvic viscera are stable since 2015, including bilateral adrenal adenomas. Chronic renal parapelvic cysts. 4. Aortic Atherosclerosis 11/3 TTE > EF 70-75%, RV not well visualized, mildly reduced RV systolic function 01/02 CT chest/ abd/ pelvis> 1. Interval  progression of diffuse bilateral hazy ground-glass airspace opacities with more focal areas of consolidation at the lung bases 2. Trace bilateral pleural effusions. 3. Postsurgical changes the abdomen as detailed above. No evidence for a postoperative abscess, however evaluation is limited by lack of IV contrast. 4. There is a 1.9 cm cystic appearing lesion located in the pancreatic body. This was not present on the patient's CT from 2015.  Follow-up with an outpatient contrast enhanced MRI is recommended. 5. The endometrial stripe appears diffusely thickened. Follow-up with pelvic ultrasound is recommended. Aortic Atherosclerosis 11/14 LE doppler studies > + DVT of right posterior tibial and peroneal vein, +dVT of left posterior tibial vein   Micro Data:  11/10 MRSA PCR > neg 11/10 BC x 2 > neg  Antimicrobials:  azithro 11/2 >11/3 Ceftriaxone 11/2  Fluconazole 11/3 Zosyn 11/3 >> 11/7 Vanc 11/10 Cefepime 11/10 > 11/16  Subjective:   Remains critically ill.  Intubated on mechanical life support.  Objective   Blood pressure 105/61, pulse 85, temperature (!) 96.1 F (35.6 C), resp. rate (!) 35, height 5\' 8"  (1.727 m), weight 85.1 kg, SpO2 97 %.    Vent Mode: PRVC FiO2 (%):  [30 %-40 %] 30 % Set Rate:  [35 bmp] 35 bmp Vt Set:  [400 mL-510 mL] 510 mL PEEP:  [8 cmH20] 8 cmH20 Pressure Support:  [14 cmH20] 14 cmH20 Plateau Pressure:  [20 cmH20-29 cmH20] 29 cmH20   Intake/Output Summary (Last 24 hours) at 09/20/2020 0704 Last data filed at 09/20/2020 0600 Gross per 24 hour  Intake 2818.22 ml  Output 2768 ml  Net 50.22 ml   Filed Weights   09/18/20 0500 09/19/20 0412 09/20/20 0418  Weight: 86.1 kg 84.7 kg 85.1 kg   Physical  Exam: General: Chronically ill intubated on mechanical life support HENT: NCAT, endotracheal tube in place Eyes: sedated Respiratory: Bilateral mechanically ventilated breath sounds Cardiovascular: Regular rate and rhythm, S1-S2 Extremities: no edema   Neuro: sedated on fent, responds to pain    Assessment & Plan:   Acute hypoxic/hypercapnic respiratory failure due to ARDS from COVID-19 pneumonia c/b HCAP and failure to wean and failed extubation 11/16 Difficulty weaning off ventilator Plan Patient remains on full vent support. PAD protocol for sedation. Continue fentanyl infusion. I do suspect she will need prolonged mechanical vent wean. I had a long discussion with the patient's daughter nearly 30 minutes on the phone about the pros and cons of tracheostomy tube placement and potential facility need placement with prolonged vent wean scenarios. Additionally we discussed her need of renal replacement therapy which is going to limit her recovery and potential rehabilitation facility needs. She was unaware of all of the complexities and behind making this kind of decision and thought that the tracheostomy tube would allow her to be discharged home.  I have placed a consult to palliative care to help with our further discussions.  Multifactorial shock, previously hemorrhagic, concerned for sepsis vs sedation related hypotension - Improved. Off pressors Cultures NGTD Plan Patient has been weaned off steroids Follow CBC.  Acute blood loss anemia  Last transfusion 11/15. Hg stable  Plan  Conservative transfusion threshold for hemoglobin less than 7  Anuric AKI on CRRT Plan CVVHD at this point, awaiting for filters to clot and may transition to IHD.  After discussion with nephrology.  Duodenal ulcer perforation status post ex lap and Graham's patch Plan Postop care per surgery They checked the wound site today.  Paroxysmal A. fib, currently in sinus rhythm Plan Continue telemetry, holding anticoagulation due to thrombocytopenia.  Thrombocytopenia-->improving Plan Continue supportive care observe Transfusion for active bleeding with a platelet count less than 10,000.  Recurrent hypoglycemia at night; lantus adjusted now  w/ excellent control Plan Lantus and SSI  1.9 cm cystic appearing lesion located in the pancreatic body Plan Outpatient follow-up  Indeterminate bilateral DVT Patient had bilateral lower extremity Dopplers done, suggestive of possible indeterminate DVT, spoke with IR for IVC filter placement, after reevaluating ultrasound of lower extremities it was suggested patient does not have femoral/popliteal DVTs, so IVC filter was not placed Plan: Unable to anticoagulate as above.  Best practice:  Diet: Tube feeds Pain/Anxiety/Delirium protocol (if indicated) midazolam fentanyl VAP protocol (if indicated): yes DVT prophylaxis: SCDs GI prophylaxis: Protonix BID Glucose control: Basal and bolus Mobility: Bedrest Code Status: Full Family Communication: Family updated on plan of care.  Disposition: ICU  This patient is critically ill with multiple organ system failure; which, requires frequent high complexity decision making, assessment, support, evaluation, and titration of therapies. This was completed through the application of advanced monitoring technologies and extensive interpretation of multiple databases. During this encounter critical care time was devoted to patient care services described in this note for 50 minutes.  Garner Nash, DO [place] 09/20/2020 7:04 AM

## 2020-09-21 ENCOUNTER — Inpatient Hospital Stay (HOSPITAL_COMMUNITY): Payer: Medicare Other

## 2020-09-21 DIAGNOSIS — J9601 Acute respiratory failure with hypoxia: Secondary | ICD-10-CM | POA: Diagnosis not present

## 2020-09-21 DIAGNOSIS — K265 Chronic or unspecified duodenal ulcer with perforation: Secondary | ICD-10-CM | POA: Diagnosis not present

## 2020-09-21 DIAGNOSIS — U071 COVID-19: Secondary | ICD-10-CM | POA: Diagnosis not present

## 2020-09-21 DIAGNOSIS — Z930 Tracheostomy status: Secondary | ICD-10-CM

## 2020-09-21 DIAGNOSIS — J80 Acute respiratory distress syndrome: Secondary | ICD-10-CM | POA: Diagnosis not present

## 2020-09-21 DIAGNOSIS — I4891 Unspecified atrial fibrillation: Secondary | ICD-10-CM | POA: Diagnosis not present

## 2020-09-21 LAB — CBC
HCT: 26.8 % — ABNORMAL LOW (ref 36.0–46.0)
Hemoglobin: 8 g/dL — ABNORMAL LOW (ref 12.0–15.0)
MCH: 26.1 pg (ref 26.0–34.0)
MCHC: 29.9 g/dL — ABNORMAL LOW (ref 30.0–36.0)
MCV: 87.3 fL (ref 80.0–100.0)
Platelets: 38 10*3/uL — ABNORMAL LOW (ref 150–400)
RBC: 3.07 MIL/uL — ABNORMAL LOW (ref 3.87–5.11)
RDW: 20.8 % — ABNORMAL HIGH (ref 11.5–15.5)
WBC: 6.6 10*3/uL (ref 4.0–10.5)
nRBC: 8.7 % — ABNORMAL HIGH (ref 0.0–0.2)

## 2020-09-21 LAB — GLUCOSE, CAPILLARY
Glucose-Capillary: 132 mg/dL — ABNORMAL HIGH (ref 70–99)
Glucose-Capillary: 195 mg/dL — ABNORMAL HIGH (ref 70–99)
Glucose-Capillary: 208 mg/dL — ABNORMAL HIGH (ref 70–99)
Glucose-Capillary: 211 mg/dL — ABNORMAL HIGH (ref 70–99)
Glucose-Capillary: 268 mg/dL — ABNORMAL HIGH (ref 70–99)
Glucose-Capillary: 78 mg/dL (ref 70–99)
Glucose-Capillary: 90 mg/dL (ref 70–99)

## 2020-09-21 LAB — RENAL FUNCTION PANEL
Albumin: 1.3 g/dL — ABNORMAL LOW (ref 3.5–5.0)
Anion gap: 7 (ref 5–15)
BUN: 27 mg/dL — ABNORMAL HIGH (ref 8–23)
CO2: 26 mmol/L (ref 22–32)
Calcium: 8.3 mg/dL — ABNORMAL LOW (ref 8.9–10.3)
Chloride: 102 mmol/L (ref 98–111)
Creatinine, Ser: 0.38 mg/dL — ABNORMAL LOW (ref 0.44–1.00)
GFR, Estimated: >60 mL/min (ref >60)
Glucose, Bld: 132 mg/dL — ABNORMAL HIGH (ref 70–99)
Phosphorus: 2.3 mg/dL — ABNORMAL LOW (ref 2.5–4.6)
Potassium: 3.6 mmol/L (ref 3.5–5.1)
Sodium: 135 mmol/L (ref 135–145)

## 2020-09-21 LAB — PROTIME-INR
INR: 1.1 (ref 0.8–1.2)
Prothrombin Time: 13.4 seconds (ref 11.4–15.2)

## 2020-09-21 LAB — APTT: aPTT: 33 s (ref 24–36)

## 2020-09-21 LAB — MAGNESIUM: Magnesium: 2.3 mg/dL (ref 1.7–2.4)

## 2020-09-21 MED ORDER — ROCURONIUM BROMIDE 50 MG/5ML IV SOLN
80.0000 mg | Freq: Once | INTRAVENOUS | Status: AC
  Administered 2020-09-21: 80 mg via INTRAVENOUS

## 2020-09-21 MED ORDER — PRISMASOL BGK 4/2.5 32-4-2.5 MEQ/L EC SOLN
Status: DC
  Filled 2020-09-21 (×5): qty 5000

## 2020-09-21 MED ORDER — MIDAZOLAM HCL 2 MG/2ML IJ SOLN
5.0000 mg | Freq: Once | INTRAMUSCULAR | Status: AC
  Administered 2020-09-21: 2.5 mg via INTRAVENOUS
  Filled 2020-09-21: qty 6

## 2020-09-21 MED ORDER — DARBEPOETIN ALFA 40 MCG/0.4ML IJ SOSY
40.0000 ug | PREFILLED_SYRINGE | INTRAMUSCULAR | Status: DC
  Filled 2020-09-21: qty 0.4

## 2020-09-21 MED ORDER — BACITRACIN ZINC 500 UNIT/GM EX OINT
1.0000 "application " | TOPICAL_OINTMENT | CUTANEOUS | Status: DC | PRN
  Administered 2020-09-30 – 2020-12-05 (×2): 1 via TOPICAL
  Filled 2020-09-21 (×2): qty 28.4

## 2020-09-21 MED ORDER — INSULIN DETEMIR 100 UNIT/ML ~~LOC~~ SOLN
25.0000 [IU] | Freq: Two times a day (BID) | SUBCUTANEOUS | Status: DC
  Administered 2020-09-21 – 2020-09-24 (×6): 25 [IU] via SUBCUTANEOUS
  Filled 2020-09-21 (×10): qty 0.25

## 2020-09-21 MED ORDER — INSULIN ASPART 100 UNIT/ML ~~LOC~~ SOLN
8.0000 [IU] | SUBCUTANEOUS | Status: DC
  Administered 2020-09-21 – 2020-09-25 (×19): 8 [IU] via SUBCUTANEOUS
  Administered 2020-09-25: 4 [IU] via SUBCUTANEOUS
  Administered 2020-09-25 – 2020-10-09 (×73): 8 [IU] via SUBCUTANEOUS

## 2020-09-21 MED ORDER — DARBEPOETIN ALFA 40 MCG/0.4ML IJ SOSY
40.0000 ug | PREFILLED_SYRINGE | INTRAMUSCULAR | Status: DC
  Administered 2020-09-21: 40 ug via SUBCUTANEOUS
  Filled 2020-09-21: qty 0.4

## 2020-09-21 MED ORDER — POTASSIUM PHOSPHATES 15 MMOLE/5ML IV SOLN
20.0000 mmol | Freq: Once | INTRAVENOUS | Status: AC
  Administered 2020-09-21: 20 mmol via INTRAVENOUS
  Filled 2020-09-21: qty 6.67

## 2020-09-21 MED ORDER — ETOMIDATE 2 MG/ML IV SOLN
40.0000 mg | Freq: Once | INTRAVENOUS | Status: AC
  Administered 2020-09-21: 20 mg via INTRAVENOUS
  Filled 2020-09-21: qty 20

## 2020-09-21 MED ORDER — SODIUM CHLORIDE 0.9 % IV SOLN
510.0000 mg | Freq: Once | INTRAVENOUS | Status: AC
  Administered 2020-09-21: 510 mg via INTRAVENOUS
  Filled 2020-09-21: qty 17

## 2020-09-21 MED ORDER — ROCURONIUM BROMIDE 10 MG/ML (PF) SYRINGE
PREFILLED_SYRINGE | INTRAVENOUS | Status: AC
  Administered 2020-09-21: 80 mg
  Filled 2020-09-21: qty 10

## 2020-09-21 MED ORDER — PROPOFOL 10 MG/ML IV BOLUS
500.0000 mg | Freq: Once | INTRAVENOUS | Status: DC
  Filled 2020-09-21: qty 60

## 2020-09-21 MED ORDER — FENTANYL CITRATE (PF) 100 MCG/2ML IJ SOLN
200.0000 ug | Freq: Once | INTRAMUSCULAR | Status: AC
  Administered 2020-09-21: 100 ug via INTRAVENOUS
  Filled 2020-09-21: qty 4

## 2020-09-21 MED ORDER — VECURONIUM BROMIDE 10 MG IV SOLR: 10.0000 mg | Freq: Once | INTRAVENOUS | Status: DC

## 2020-09-21 NOTE — Progress Notes (Signed)
PCCM:  I consented the patients daughter yesterday for tracheostomy tube placement.  The CCM team will make arrangements for procedure today at bedside.   Garner Nash, DO [place] 09/21/2020 8:56 AM

## 2020-09-21 NOTE — Procedures (Signed)
Percutaneous Tracheostomy Procedure Note   Candace [name]  [id number]  12/18/1950  Date:09/21/20  Time:10:30 AM   Provider Performing:Kryslyn Helbig  Procedure: Percutaneous Tracheostomy with Bronchoscopic Guidance (31600)  Indication(s) Acute respiratory failure  Consent Risks of the procedure as well as the alternatives and risks of each were explained to the patient and/or caregiver.  Consent for the procedure was obtained.  Anesthesia Etomidate, Versed, Fentanyl, Vecuronium   Time Out Verified patient identification, verified procedure, site/side was marked, verified correct patient position, special equipment/implants available, medications/allergies/relevant history reviewed, required imaging and test results available.   Sterile Technique Maximal sterile technique including sterile barrier drape, hand hygiene, sterile gown, sterile gloves, mask, hair covering.    Procedure Description Appropriate anatomy identified by palpation. Patient's neck prepped and draped in sterile fashion.  1% lidocaine with epinephrine was used to anesthetize skin overlying neck.  1.5cm incision made and blunt dissection performed until tracheal rings could be easily palpated.   Then a size 6 Shiley tracheostomy was placed under bronchoscopic visualization using usual Seldinger technique and serial dilation. Bronchoscope confirmed placement above the carina. Tracheostomy was sutured in place with adhesive pad to protect skin under pressure. Patient connected to ventilator.   Complications/Tolerance None; patient tolerated the procedure well. Chest X-ray is ordered to confirm no post-procedural complication.   EBL Minimal   Specimen(s) None

## 2020-09-21 NOTE — Procedures (Signed)
Diagnostic Bronchoscopy  Candace [name]  [id number]  04-May-1951  Date:09/21/20  Time:10:22 AM   Provider Performing:Lesleyanne Politte C Tamala Julian   Procedure: Diagnostic Bronchoscopy (15183)  Indication(s) Assist with direct visualization of tracheostomy placement  Consent Risks of the procedure as well as the alternatives and risks of each were explained to the patient and/or caregiver.  Consent for the procedure was obtained.   Anesthesia See separate tracheostomy note   Time Out Verified patient identification, verified procedure, site/side was marked, verified correct patient position, special equipment/implants available, medications/allergies/relevant history reviewed, required imaging and test results available.   Sterile Technique Usual hand hygiene, masks, gowns, and gloves were used   Procedure Description Bronchoscope advanced through endotracheal tube and into airway.  After suctioning out tracheal secretions, bronchoscope used to provide direct visualization of tracheostomy placement.   Complications/Tolerance None; patient tolerated the procedure well.   EBL None  Specimen(s) None

## 2020-09-21 NOTE — Progress Notes (Signed)
[facility] Progress Note  Name: Candace [name] MRN: [id number] DOB: 08-05-1951  Subjective:  She had 3.3 liters UF with CRRT over 11/22.  No UOP charted.  Yesterday issues with synchrony with vent and per RN she got additional sedation with a drop in her pressures.  Dialysate changed to 2K for K 5.4   Review of systems:  Unable to obtain 2/2 AMS   Intake/Output Summary (Last 24 hours) at 09/21/2020 0744 Last data filed at 09/21/2020 0743 Gross per 24 hour  Intake 2941.7 ml  Output 3724 ml  Net -782.3 ml    Vitals:  Vitals:   09/21/20 0645 09/21/20 0700 09/21/20 0702 09/21/20 0730  BP: 113/70 114/69    Pulse: 94 95 95 98  Resp: (!) 35 (!) 35 (!) 35 (!) 35  Temp: (!) 95 F (35 C) (!) 95 F (35 C) (!) 95 F (35 C)   TempSrc:      SpO2: 100% 100% 100% 100%  Weight:      Height:         Physical Exam:   General adult female in bed critically ill  HEENT normocephalic atraumatic Neck supple trachea midline Lungs clear to auscultation anteriorly on vent  Heart S1S2 no rub appreciated Abdomen soft obese habitus Extremities no pitting edema  Neuro - sedation currently running Access left chest nontunneled catheter    Medications reviewed   Labs:  BMP Latest Ref Rng & Units 09/21/2020 09/20/2020 09/20/2020  Glucose 70 - 99 mg/dL 132(H) 208(H) 277(H)  BUN 8 - 23 mg/dL 27(H) 28(H) 27(H)  Creatinine 0.44 - 1.00 mg/dL 0.38(L) 0.49 0.43(L)  Sodium 135 - 145 mmol/L 135 133(L) 134(L)  Potassium 3.5 - 5.1 mmol/L 3.6 5.4(H) 6.0(H)  Chloride 98 - 111 mmol/L 102 100 99  CO2 22 - 32 mmol/L 26 26 25   Calcium 8.9 - 10.3 mg/dL 8.3(L) 8.4(L) 8.2(L)     Assessment/Plan:   1.Acute kidney injury, oliguric likely ATN due to hemorrhagic shock: - Patient initially had AKI with creatinine level of 5.6 on admission due to A. fib with RVR/hemodynamic instability which was improved. However she developed another episode of AKI after shock.  Urinalysis was  contaminated/UTI. CT scan A/P without hydronephrosis. Started CRRT on 11/10 because of oliguria and hyperkalemia.   - Plan to transition to iHD soon  - Stop CRRT when cartridge expires or circuit clots. Discussed with RN  - Change back to 4K fluids for dialysate - Reduce dialysate to 1.5 l/hr  - Net neg 50 ml/hr as tolerated   2.Hyperkalemia due to AKI and intra-abdominal bleeding/PRBC transfusion: improved with CRRT. Change back to 4k fluids   3.Hypophosphatemia: continue to replete phos as needed; replete again today    4.Hemorrhagic shock: s/p PRBC's.  Maintain MAP >65  5.Perforated duodenal ulcer status post ex lap: per general surgery. Currently on tube feeds.  6.ARDS/recent Covid pneumonia: mechanical ventilation per primary team   7. Anemia 2/2 AKI and hemorrhagic shock - feraheme 510 mg IV once on 11/23.  Start aranesp 40 mcg on 11/23 and ordered every Tuesday  Claudia Desanctis, MD 09/21/2020  7:58 AM

## 2020-09-21 NOTE — Progress Notes (Signed)
Daily Progress Note   Patient Name: Candace [name]       Date: 09/21/2020 DOB: July 26, 1951  Age: 69 y.o. MRN#: [id number] Attending Physician: Garner Nash, DO Primary Care Physician: Algis Greenhouse, MD Admit Date: 08/31/2020  Reason for Consultation/Follow-up: Establishing goals of care  Subjective: Patient intermittently will open eyes to voice. She remains on fentanyl. Trach placed this AM.    GOC:  Daughter, [name] at bedside. Provided update for the day including plans to attempt iHD. Discussed ongoing wean attempts with trach/vent as well as see how she will tolerate trach/vent without sedation. Discussed ongoing discussions with many barriers to discharge. [name] again reiterates that they have a very large, supportive family and their mother would wish to be at home. [name] and family will do whatever is necessary to bring her home including hire private caregiver support. Explained that it would be possible to eventually try and get her mother home with home health and DME including ventilator but biggest barrier is her need for ongoing dialysis. Discussed ongoing discussions pending clinical course and pending approval for LTACH. [name] is staying positive and hopeful that her mother will show improvement. Hard Choices and PMT contact information given.   Length of Stay: 20  Current Medications: Scheduled Meds:  . sodium chloride   Intravenous Once  . B-complex with vitamin C  1 tablet Per Tube Daily  . chlorhexidine gluconate (MEDLINE KIT)  15 mL Mouth Rinse BID  . Chlorhexidine Gluconate Cloth  6 each Topical Daily  . chlorpheniramine-HYDROcodone  5 mL Per Tube Q12H  . darbepoetin (ARANESP) injection - DIALYSIS  40 mcg Intravenous Q Tue-HD  . feeding supplement (PIVOT 1.5 CAL)   1,000 mL Per Tube Q24H  . feeding supplement (PROSource TF)  45 mL Per Tube BID  . hydrocortisone sod succinate (SOLU-CORTEF) inj  50 mg Intravenous Daily  . insulin aspart  0-20 Units Subcutaneous Q4H  . insulin aspart  8 Units Subcutaneous Q4H  . insulin detemir  25 Units Subcutaneous BID  . mouth rinse  15 mL Mouth Rinse 10 times per day  . oxyCODONE  10 mg Per Tube Q6H  . pantoprazole sodium  40 mg Per Tube BID  . polyethylene glycol  17 g Per Tube Daily  . propofol  500 mg Intravenous Once  .  sodium chloride flush  10-40 mL Intracatheter Q12H    Continuous Infusions: .  prismasol BGK 4/2.5 Stopped (09/21/20 1000)  .  prismasol BGK 4/2.5 Stopped (09/21/20 1000)  . sodium chloride Stopped (09/19/20 1731)  . dexmedetomidine (PRECEDEX) IV infusion 0.4 mcg/kg/hr (09/21/20 0700)  . fentaNYL infusion INTRAVENOUS 250 mcg/hr (09/21/20 0900)  . ferumoxytol 510 mg (09/21/20 1122)  . norepinephrine (LEVOPHED) Adult infusion 5 mcg/min (09/21/20 1000)  . potassium PHOSPHATE IVPB (in mmol) 20 mmol (09/21/20 0929)  . prismasol BGK 4/2.5 Stopped (09/21/20 1000)    PRN Meds: sodium chloride, acetaminophen (TYLENOL) oral liquid 160 mg/5 mL, albuterol, diphenhydrAMINE, fentaNYL, heparin, heparin, midazolam, phenol, silver nitrate applicators, sodium chloride, sodium chloride flush  Physical Exam Vitals and nursing note reviewed.  Constitutional:      Interventions: She is sedated and intubated.  HENT:     Head: Normocephalic and atraumatic.  Cardiovascular:     Rate and Rhythm: Rhythm irregularly irregular.  Pulmonary:     Effort: No tachypnea, accessory muscle usage or respiratory distress. She is intubated.     Comments: Trach placed today Skin:    General: Skin is warm and dry.  Neurological:     Comments: Intermittently opens eyes to voice. Follows simple commands            Vital Signs: BP 101/78   Pulse (!) 107   Temp (!) 95.5 F (35.3 C)   Resp (!) 28   Ht 5' 8"  (1.727 m)    Wt 87.4 kg   SpO2 100%   BMI 29.30 kg/m  SpO2: SpO2: 100 % O2 Device: O2 Device: Ventilator O2 Flow Rate: O2 Flow Rate (L/min): 15 L/min  Intake/output summary:   Intake/Output Summary (Last 24 hours) at 09/21/2020 1133 Last data filed at 09/21/2020 2774 Gross per 24 hour  Intake 2662.81 ml  Output 3298 ml  Net -635.19 ml   LBM: Last BM Date: 09/21/20 Baseline Weight: Weight: 99.7 kg Most recent weight: Weight: 87.4 kg       Palliative Assessment/Data: PPS 30%    Flowsheet Rows     Most Recent Value  Intake Tab  Referral Department Critical care  Unit at Time of Referral ICU  Palliative Care Primary Diagnosis Other (Comment)  Palliative Care Type New Palliative care  Reason for referral Clarify Goals of Care  Date first seen by Palliative Care 09/20/20  Clinical Assessment  Palliative Performance Scale Score 30%  Psychosocial & Spiritual Assessment  Palliative Care Outcomes  Patient/Family meeting held? Yes  Who was at the meeting? daughter in family room. Daughter and neice on facetime  Palliative Care Outcomes Clarified goals of care, Provided end of life care assistance, Provided psychosocial or spiritual support, ACP counseling assistance      Patient Active Problem List   Diagnosis Date Noted  . Perforated duodenal ulcer ([place])   . On mechanically assisted ventilation ([place])   . Palliative care by specialist   . Goals of care, counseling/discussion   . Acute respiratory failure ([place])   . ARDS (adult respiratory distress syndrome) ([place])   . Pressure injury of skin 09/08/2020  . Hypoxia   . COVID-19 08/31/2020  . New onset a-fib ([place]) 08/31/2020  . Atrial fibrillation with RVR ([place]) 08/31/2020  . Leukocytosis 08/31/2020  . Atrial fibrillation with rapid ventricular response ([place]) 08/31/2020  . AKI (acute kidney injury) ([place]) 08/23/2020    Palliative Care Assessment & Plan   Patient Profile: 69 y.o. female  with past medical history  of HTN, HLD, DM,  recent COVID-19 infection 08/23/20 admitted on 08/31/2020 with hypoxia and atrial fibrillation RVR. Admitted to medical floor and started on Cardizem infusion and Eliquis. Hospital course complicated by abdominal pain, found to have perforated duodenal ulcer s/p explaratory lap, lysis of adhesions, Graham patch placement. Developed bleeding from surgical site post op day 6. 11/10 ICU transfer due to hemorrhagic shock, intubated, transfused, pressors, proned, and started on CRRT. Extubated 11/16 and re-intubated that evening due to respiratory distress, hypoxia, and altered mental status. Remains critically ill, intubated on mechanical ventilation. Ongoing CRRT with plans to attempt iHD per nephrology. Palliative medicine consultation for goals of care.   Assessment: Acute hypoxic and hypercapnic respiratory failure ARDS from COVID-19 pneumonia S/p trach placement 11/23 Shock ABLA Duodenal ulcer perforation s/p ex lap and Grahm patch Paroxysmal A.fib Thrombocytopenia  Recommendations/Plan: Continue full code/full scope treatment following GOC discussion 09/20/20. Family desires any offered and available medical interventions to prolong life at this time, including trach/PEG placement. Patient does not have a documented living will but has previously spoken to her daughter of the desire to live and have resuscitation attempted if necessary.  TOC team referral for [facility] eligibility. Daughter hopeful for local placement. Ultimately, family would like to take her home and can provide 24/7 care. Also willing to hire private duty caregivers. Discussed barrier to home discharge at this point is hemodialysis.  Ongoing palliative support and discussions this admission.   Goals of Care and Additional Recommendations: Limitations on Scope of Treatment: Full Scope Treatment  Code Status: FULL   Code Status Orders  (From admission, onward)         Start     Ordered   08/31/20 1711  Full code   Continuous        08/31/20 1711        Code Status History    Date Active Date Inactive Code Status Order ID Comments User Context   08/23/2020 1434 08/29/2020 2259 Full Code [id number]  Caren Griffins, MD ED   Advance Care Planning Activity      Prognosis: Guarded  Discharge Planning: To Be Determined  Care plan was discussed with daughter  Thank you for allowing the Palliative Medicine Team to assist in the care of this patient.   Total Time 20 Prolonged Time Billed no      Greater than 50%  of this time was spent counseling and coordinating care related to the above assessment and plan.  Ihor Dow, DNP, FNP-C Palliative Medicine Team  Phone: [phone] Fax: [phone]  Please contact Palliative Medicine Team phone at [phone] for questions and concerns.

## 2020-09-21 NOTE — Progress Notes (Signed)
NAMEEstie [name], MRN:  [id number], DOB:  [birth year], LOS: 75 ADMISSION DATE:  08/31/2020, CONSULTATION DATE:  09/07/2020 REFERRING MD:  Dr Candiss Norse, CHIEF COMPLAINT:  Acute resp failure  Brief History   69 year old female who was previously diagnosed with Covid 08/23/2020.  Admitted 11/2 with AF-RVR, found to have a perforated duodenal underwent exploratory laparotomy with Phillip Heal patch placement.  Past Medical History  Covid pneumonia Atrial fibrillation CKD stage III Diabetes mellitus Hypertension Colon cancer Hyperlipidemia  Significant Hospital Events   11/2 Admitted  11/3 OR -> perforated duodenal ulcer 11/10 progressive hemorrhagic shock, intubated, transfused, pressors, proned; started on CRRT in PM 11/16 Extubated. Re-intubated overnight due to respiratory distress and hypoxia with decreased mentation 11/18  bronch'd/ cultures sent 11/19 hgb down getting blood  Consults:  Cardiology [facility] Nephrology   Procedures:  11/3 Exploratory laparotomy, Phillip Heal patch, lysis of adhesion for duodenal ulceration postop day 6  R PICC 11/5 >> A line 11/9 >> ETT 11/9 > 11/16, 11/16 >>  Lt Progress CVL 11/9 >> R IJ trialysis >>   Significant Diagnostic Tests:  11/3 CT abd/ pelvis > 1. Positive for bowel perforation: Pneumoperitoneum and intermediate density free fluid in the abdomen. Prior total colectomy. The specific site of perforation is unclear-oral contrast present to the proximal jejunum has not obviously leaked. Note that there may be small bowel loops adherent to the ventral abdominal wall along the greater curve of the stomach. 2. Extensive bilateral lower lung pneumonia. No pleural effusion. 3. Other abdominal and pelvic viscera are stable since 2015, including bilateral adrenal adenomas. Chronic renal parapelvic cysts. 4. Aortic Atherosclerosis 11/3 TTE > EF 70-75%, RV not well visualized, mildly reduced RV systolic function 81/01 CT chest/ abd/ pelvis> 1. Interval  progression of diffuse bilateral hazy ground-glass airspace opacities with more focal areas of consolidation at the lung bases 2. Trace bilateral pleural effusions. 3. Postsurgical changes the abdomen as detailed above. No evidence for a postoperative abscess, however evaluation is limited by lack of IV contrast. 4. There is a 1.9 cm cystic appearing lesion located in the pancreatic body. This was not present on the patient's CT from 2015.  Follow-up with an outpatient contrast enhanced MRI is recommended. 5. The endometrial stripe appears diffusely thickened. Follow-up with pelvic ultrasound is recommended. Aortic Atherosclerosis 11/14 LE doppler studies > + DVT of right posterior tibial and peroneal vein, +dVT of left posterior tibial vein   Micro Data:  11/10 MRSA PCR > neg 11/10 BC x 2 > neg  Antimicrobials:  azithro 11/2 >11/3 Ceftriaxone 11/2  Fluconazole 11/3 Zosyn 11/3 >> 11/7 Vanc 11/10 Cefepime 11/10 > 11/16  Subjective:   Patient remains critically ill intubated on mechanical life support.  Did tolerate pressure support ventilation this morning for short period.  Objective   Blood pressure (!) 151/82, pulse 100, temperature (!) 95.2 F (35.1 C), resp. rate (!) 27, height 5\' 8"  (1.727 m), weight 85.1 kg, SpO2 (!) 89 %.    Vent Mode: PRVC FiO2 (%):  [40 %-50 %] 40 % Set Rate:  [35 bmp] 35 bmp Vt Set:  [450 mL-510 mL] 510 mL PEEP:  [5 cmH20-8 cmH20] 5 cmH20 Plateau Pressure:  [24 cmH20-35 cmH20] 31 cmH20   Intake/Output Summary (Last 24 hours) at 09/21/2020 0859 Last data filed at 09/21/2020 0800 Gross per 24 hour  Intake 2872.09 ml  Output 3722 ml  Net -849.91 ml   Filed Weights   09/18/20 0500 09/19/20 0412 09/20/20 0418  Weight: 86.1 kg  84.7 kg 85.1 kg    Physical Exam: General: Chronically critically ill intubated mechanical life support HENT: NCAT, endotracheal tube in place Eyes: Sedated, however will open eyes to voice Respiratory: Bilateral  mechanically ventilated breath sounds Cardiovascular: Regular rate rhythm, S1-S2 Extremities: Dependent edema Neuro: Sedated on ventilator, will attempt to follow commands   Assessment & Plan:   Acute hypoxic/hypercapnic respiratory failure due to ARDS from COVID-19 pneumonia c/b HCAP and failure to wean and failed extubation 11/16 Difficulty weaning off ventilator Plan Patient remains on full vent support at this time. Continue PAD sedation protocol Likely will need prolonged mechanical vent wean. Long discussions yesterday with family regarding next steps. Patient's family has elected to proceed with tracheostomy tube placement. We will plan for this later today. Patient's daughter freely signed consent after discussing the risk benefits and alternatives as well as plans for a long-term care facility placement.  They also have an understanding that due to her current dialysis need as well as potential long-term vent weaning that she may not be able to be placed in a facility that is local.  They understand this risk and are agreeable to proceed.  Multifactorial shock, previously hemorrhagic, concerned for sepsis vs sedation related hypotension - Improved. Off pressors Cultures NGTD Plan At this time low blood pressures likely related to ongoing sedation needs and CVVHD. Once we are able to get her off of this we can readdress to see if she needs supplemental midodrine  Acute blood loss anemia  Last transfusion 11/15. Hg stable  Plan  Conservative hemoglobin transfusion threshold for less than 7.  Anuric AKI on CRRT Plan Continue CVVHD at this time. Per nephrology if filter clots can attempt to transition to IHD.  Duodenal ulcer perforation status post ex lap and Graham's patch Plan Postop care per surgery  Paroxysmal A. fib, currently in sinus rhythm Plan Telemetry monitoring Holding anticoagulation due to thrombocytopenia  Thrombocytopenia-->improving Plan Still  holding anticoagulation due to thrombocytopenia However this is improving.  Glucose control Plan Lantus plus SSI  1.9 cm cystic appearing lesion located in the pancreatic body Plan Outpatient follow-up  History of stage IV colon cancer Status post pulmonary metastatectomies at [facility] + Chemo - please review in Epic care everywhere - this was in 2015-2019 time frame - follow up imaging in 2020 documented no recurrance   Indeterminate bilateral DVT Patient had bilateral lower extremity Dopplers done, suggestive of possible indeterminate DVT, spoke with IR for IVC filter placement, after reevaluating ultrasound of lower extremities it was suggested patient does not have femoral/popliteal DVTs, so IVC filter was not placed Plan: Unable to start [facility] at this time   Best practice:  Diet: Tube feeds Pain/Anxiety/Delirium protocol (if indicated) midazolam fentanyl VAP protocol (if indicated): yes DVT prophylaxis: SCDs GI prophylaxis: Protonix BID Glucose control: Basal and bolus Mobility: Bedrest Code Status: Full Family Communication: family updated yesterday  Disposition: ICU  This patient is critically ill with multiple organ system failure; which, requires frequent high complexity decision making, assessment, support, evaluation, and titration of therapies. This was completed through the application of advanced monitoring technologies and extensive interpretation of multiple databases. During this encounter critical care time was devoted to patient care services described in this note for 32 minutes.  Garner Nash, DO [facility] 09/21/2020 9:00 AM

## 2020-09-21 NOTE — Progress Notes (Signed)
Pts HR elevated to 136, pt appears to be uncomfortable. Dr. Valeta Harms at bedside and gave verbal order to bolus 100 mcg of Fentanyl. Bolus given via infusion pump.

## 2020-09-21 NOTE — Progress Notes (Signed)
Nutrition Follow-up  DOCUMENTATION CODES:   Obesity unspecified  INTERVENTION:   Tube Feeding via Cortrak: Pivot 1.5 at 65 ml/hr Pro-Source TF 45 mL BID Provides 168 g of protein, 2420 kcals and 1186 mL of free water Meets 100% estimated calorie and protein needs  Continue B-complex with C   NUTRITION DIAGNOSIS:   Inadequate oral intake related to inability to eat as evidenced by NPO status.  Being addressed via TF   GOAL:   Patient will meet greater than or equal to 90% of their needs  Met  MONITOR:   Vent status, TF tolerance, Labs, Weight trends  REASON FOR ASSESSMENT:   Consult New TPN/TNA  ASSESSMENT:   69 y.o. female was recently hospitalized for COVID-19 pneumonia-and discharged on 2-3 L of home O2-presents with worsening shortness of breath-found to have A. fib with RVR, worsening hypoxia. Pt developed severe upper abdominal pain-subsequent further imaging studies revealed perforated viscus. S/p laparotomy on 11/3 which showed a perforated duodenal ulcer.  11/03Exploratory laparotomy, adhesiolysis, modified graham patch repair of perforated duodenal ulcer 11/05 TPN initiated 11/08 UGI negative for leak 11/09 Acute decline in respiratory and mental status requiring intubation 11/10 Started on trickle TF, CT abdomen negative for postop complication 64/33 TPN discontinued due to elevated LFTS, TGs; TF increased to 30 ml/hr, Started CRRT 11/16 Extubated, Re-intubated 11/23 Trach placed, CRRT d/c  Pt sedated on vent support, trach placed today; requiring fentanyl and precedex; off pressors  CRRT discontinued with plans for iHD tomorrow  Pt has been tolerating Pivot 1.5 at 65 ml/hr with Pro-Stat 45 mL BID via Cortrak  Current wt 87.4 kg; admit weight 99.7 kg. Net +4 L. Pt with edema; unsure of actual dry weight  Ileostomy with 225 mL in 24 hours  Labs: reviewed Meds: ss novolog, novolog q 4 hours, levemir BID   Diet Order:   Diet Order             Diet NPO time specified  Diet effective midnight           Diet NPO time specified  Diet effective now                 EDUCATION NEEDS:   Not appropriate for education at this time  Skin:  Skin Assessment: Skin Integrity Issues: Skin Integrity Issues:: Stage I Stage I: sacrum Incisions: abdomen  Last BM:  11/23 ileostomy  Height:   Ht Readings from Last 1 Encounters:  09/20/20 5' 8" (1.727 m)    Weight:   Wt Readings from Last 1 Encounters:  09/21/20 87.4 kg    BMI:  Body mass index is 29.3 kg/m.  Estimated Nutritional Needs:   Kcal:  2951-8841 kcals  Protein:  135-175 g  Fluid:  >/= 2 L/day   Kerman Passey MS, RDN, LDN, CNSC Registered Dietitian III Clinical Nutrition RD Pager and On-Call Pager Number Located in [place]

## 2020-09-21 NOTE — TOC Progression Note (Signed)
Transition of Care [facility]) - Progression Note    Patient Details  Name: Candace [name] MRN: [id number] Date of Birth: [birth year]  Transition of Care [facility]) CM/SW Contact  Bartholomew Crews, RN Phone Number: [phone] 09/21/2020, 12:22 PM  Clinical Narrative:     Spoke with daughter, [name], at bedside. Patient now has trach. CRRT stopped this AM, and will transition to iHD. [name] wanting update from surgery. Discussed referral to [facility] for [facility] - no HD beds available at this time. [name] asking about caring for patient at home with the ventilator. Advised that complex care at home can be possible, however, the need for hemodialysis further complicates this option. TOC continuing to follow for transition needs.   Expected Discharge Plan: Long Term Acute Care (LTAC) Barriers to Discharge: Continued Medical Work up  Expected Discharge Plan and Services Expected Discharge Plan: Long Term Acute Care (LTAC)   Discharge Planning Services: CM Consult Post Acute Care Choice: Long Term Acute Care (LTAC) Living arrangements for the past 2 months: Single Family Home                 DME Arranged: N/A DME Agency: NA       HH Arranged: NA HH Agency: NA         Social Determinants of Health (SDOH) Interventions    Readmission Risk Interventions No flowsheet data found.

## 2020-09-21 NOTE — Progress Notes (Signed)
CRRT filter clotted, CRRT stopped, no blood return. Dr. Royce Macadamia informed and plans to transition patient to Hemodialysis tomorrow.

## 2020-09-22 LAB — RENAL FUNCTION PANEL
Albumin: 1.3 g/dL — ABNORMAL LOW (ref 3.5–5.0)
Albumin: 1.2 g/dL — ABNORMAL LOW (ref 3.5–5.0)
Anion gap: 10 (ref 5–15)
Anion gap: 11 (ref 5–15)
BUN: 79 mg/dL — ABNORMAL HIGH (ref 8–23)
BUN: 71 mg/dL — ABNORMAL HIGH (ref 8–23)
CO2: 21 mmol/L — ABNORMAL LOW (ref 22–32)
CO2: 23 mmol/L (ref 22–32)
Calcium: 8.5 mg/dL — ABNORMAL LOW (ref 8.9–10.3)
Calcium: 8.5 mg/dL — ABNORMAL LOW (ref 8.9–10.3)
Chloride: 102 mmol/L (ref 98–111)
Chloride: 101 mmol/L (ref 98–111)
Creatinine, Ser: 1.31 mg/dL — ABNORMAL HIGH (ref 0.44–1.00)
Creatinine, Ser: 1.08 mg/dL — ABNORMAL HIGH (ref 0.44–1.00)
GFR, Estimated: 44 mL/min — ABNORMAL LOW (ref >60)
GFR, Estimated: 56 mL/min — ABNORMAL LOW (ref >60)
Glucose, Bld: 239 mg/dL — ABNORMAL HIGH (ref 70–99)
Glucose, Bld: 240 mg/dL — ABNORMAL HIGH (ref 70–99)
Phosphorus: 7.5 mg/dL — ABNORMAL HIGH (ref 2.5–4.6)
Phosphorus: 5.9 mg/dL — ABNORMAL HIGH (ref 2.5–4.6)
Potassium: 6.8 mmol/L (ref 3.5–5.1)
Potassium: 5.5 mmol/L — ABNORMAL HIGH (ref 3.5–5.1)
Sodium: 133 mmol/L — ABNORMAL LOW (ref 135–145)
Sodium: 135 mmol/L (ref 135–145)

## 2020-09-22 LAB — CBC
HCT: 27.8 % — ABNORMAL LOW (ref 36.0–46.0)
Hemoglobin: 8.3 g/dL — ABNORMAL LOW (ref 12.0–15.0)
MCH: 26.5 pg (ref 26.0–34.0)
MCHC: 29.9 g/dL — ABNORMAL LOW (ref 30.0–36.0)
MCV: 88.8 fL (ref 80.0–100.0)
Platelets: 90 10*3/uL — ABNORMAL LOW (ref 150–400)
RBC: 3.13 MIL/uL — ABNORMAL LOW (ref 3.87–5.11)
RDW: 21.6 % — ABNORMAL HIGH (ref 11.5–15.5)
WBC: 16.5 10*3/uL — ABNORMAL HIGH (ref 4.0–10.5)
nRBC: 6.1 % — ABNORMAL HIGH (ref 0.0–0.2)

## 2020-09-22 LAB — POCT I-STAT 7, (LYTES, BLD GAS, ICA,H+H)
Acid-base deficit: 3 mmol/L — ABNORMAL HIGH (ref 0.0–2.0)
Bicarbonate: 24.1 mmol/L (ref 20.0–28.0)
Calcium, Ion: 1.27 mmol/L (ref 1.15–1.40)
HCT: 26 % — ABNORMAL LOW (ref 36.0–46.0)
Hemoglobin: 8.8 g/dL — ABNORMAL LOW (ref 12.0–15.0)
O2 Saturation: 72 %
Patient temperature: 101.2
Potassium: 6.8 mmol/L (ref 3.5–5.1)
Sodium: 133 mmol/L — ABNORMAL LOW (ref 135–145)
TCO2: 26 mmol/L (ref 22–32)
pCO2 arterial: 53.7 mmHg — ABNORMAL HIGH (ref 32.0–48.0)
pH, Arterial: 7.267 — ABNORMAL LOW (ref 7.350–7.450)
pO2, Arterial: 47 mmHg — ABNORMAL LOW (ref 83.0–108.0)

## 2020-09-22 LAB — GLUCOSE, CAPILLARY
Glucose-Capillary: 137 mg/dL — ABNORMAL HIGH (ref 70–99)
Glucose-Capillary: 164 mg/dL — ABNORMAL HIGH (ref 70–99)
Glucose-Capillary: 179 mg/dL — ABNORMAL HIGH (ref 70–99)
Glucose-Capillary: 216 mg/dL — ABNORMAL HIGH (ref 70–99)
Glucose-Capillary: 218 mg/dL — ABNORMAL HIGH (ref 70–99)
Glucose-Capillary: 219 mg/dL — ABNORMAL HIGH (ref 70–99)

## 2020-09-22 LAB — MAGNESIUM: Magnesium: 2.6 mg/dL — ABNORMAL HIGH (ref 1.7–2.4)

## 2020-09-22 LAB — POTASSIUM
Potassium: 6 mmol/L — ABNORMAL HIGH (ref 3.5–5.1)
Potassium: 6.6 mmol/L (ref 3.5–5.1)
Potassium: 5.7 mmol/L — ABNORMAL HIGH (ref 3.5–5.1)

## 2020-09-22 MED ORDER — DEXTROSE 50 % IV SOLN
1.0000 | Freq: Once | INTRAVENOUS | Status: AC
  Administered 2020-09-22: 50 mL via INTRAVENOUS
  Filled 2020-09-22: qty 50

## 2020-09-22 MED ORDER — VASOPRESSIN 20 UNITS/100 ML INFUSION FOR SHOCK
0.0000 [IU]/min | INTRAVENOUS | Status: DC
  Administered 2020-09-23: 0.03 [IU]/min via INTRAVENOUS
  Filled 2020-09-22: qty 100

## 2020-09-22 MED ORDER — INSULIN ASPART 100 UNIT/ML IV SOLN
5.0000 [IU] | Freq: Once | INTRAVENOUS | Status: AC
  Administered 2020-09-22: 5 [IU] via INTRAVENOUS

## 2020-09-22 MED ORDER — PRISMASOL BGK 4/2.5 32-4-2.5 MEQ/L REPLACEMENT SOLN
Status: DC
  Filled 2020-09-22 (×20): qty 5000

## 2020-09-22 MED ORDER — SODIUM CHLORIDE 0.9 % IV SOLN
1.0000 g | Freq: Once | INTRAVENOUS | Status: AC
  Administered 2020-09-22: 1 g via INTRAVENOUS
  Filled 2020-09-22: qty 1

## 2020-09-22 MED ORDER — PRISMASOL BGK 0/2.5 32-2.5 MEQ/L EC SOLN
Status: DC
  Filled 2020-09-22 (×19): qty 5000

## 2020-09-22 MED ORDER — PRISMASOL BGK 4/2.5 32-4-2.5 MEQ/L REPLACEMENT SOLN
Status: DC
  Filled 2020-09-22 (×12): qty 5000

## 2020-09-22 MED ORDER — HEPARIN SODIUM (PORCINE) 1000 UNIT/ML DIALYSIS
1000.0000 [IU] | INTRAMUSCULAR | Status: DC | PRN
  Administered 2020-09-24: 3800 [IU] via INTRAVENOUS_CENTRAL
  Administered 2020-10-04: 3400 [IU] via INTRAVENOUS_CENTRAL
  Administered 2020-10-06: 4000 [IU] via INTRAVENOUS_CENTRAL
  Administered 2020-10-18 – 2020-10-19 (×2): 3100 [IU] via INTRAVENOUS_CENTRAL
  Administered 2020-10-22: 10:00:00 3800 [IU] via INTRAVENOUS_CENTRAL
  Administered 2020-11-03: 4000 [IU] via INTRAVENOUS_CENTRAL
  Filled 2020-09-22 (×3): qty 4
  Filled 2020-09-22: qty 1
  Filled 2020-09-22 (×5): qty 4

## 2020-09-22 MED ORDER — VANCOMYCIN HCL IN DEXTROSE 1-5 GM/200ML-% IV SOLN
1000.0000 mg | INTRAVENOUS | Status: AC
  Administered 2020-09-23 – 2020-09-28 (×6): 1000 mg via INTRAVENOUS
  Filled 2020-09-22 (×7): qty 200

## 2020-09-22 MED ORDER — VANCOMYCIN HCL IN DEXTROSE 1-5 GM/200ML-% IV SOLN
1000.0000 mg | Freq: Once | INTRAVENOUS | Status: AC
  Administered 2020-09-22: 1000 mg via INTRAVENOUS
  Filled 2020-09-22: qty 200

## 2020-09-22 MED ORDER — ALBUTEROL SULFATE (2.5 MG/3ML) 0.083% IN NEBU: 2.5000 mg | INHALATION_SOLUTION | Freq: Four times a day (QID) | RESPIRATORY_TRACT | Status: DC | PRN

## 2020-09-22 MED ORDER — SODIUM CHLORIDE 0.9 % IV SOLN
2.0000 g | Freq: Two times a day (BID) | INTRAVENOUS | Status: DC
  Administered 2020-09-22 – 2020-09-23 (×3): 2 g via INTRAVENOUS
  Filled 2020-09-22 (×3): qty 2

## 2020-09-22 MED ORDER — NEPRO/CARBSTEADY PO LIQD
1000.0000 mL | ORAL | Status: DC
  Administered 2020-09-22 – 2020-09-29 (×9): 1000 mL
  Filled 2020-09-22 (×13): qty 1000

## 2020-09-22 NOTE — Progress Notes (Signed)
Pharmacy Antibiotic Note  Candace [name] is a 69 y.o. female admitted on 08/31/2020 with afib with RVR now with possible sepsis. WBC 16.5, Tmax 102.7, pressor requirement is increasing. Pharmacy has been consulted for vancomycin and cefepime dosing.  Patient's CRRT was stopped 11/23 with plans to resume today.  Plan: Vancomycin 1000 mg IV x1 Cefepime 1g IV x1 Monitor CRRT start, C&S, clinical status  Height: _0  (172.7 cm) Weight: 87.4 kg (192 lb 10.9 oz) IBW/kg (Calculated) : 63.9  Temp (24hrs), Avg:101.4 F (38.6 C), Min:100.2 F (37.9 C), Max:102.7 F (39.3 C)  Recent Labs  Lab 09/17/20 1613 09/18/20 0330 09/19/20 0504 09/19/20 1629 09/20/20 0428 09/20/20 1541 09/20/20 1707 09/21/20 0435 09/22/20 0342  WBC 18.2*  --  14.4*  --  9.3  --   --  6.6 16.5*  CREATININE 0.47   < > 0.48   < > 0.46 0.43* 0.49 0.38* 1.31*   < > = values in this interval not displayed.    Estimated Creatinine Clearance: 46.9 mL/min (A) (by C-G formula based on SCr of 1.31 mg/dL (H)).    No Known Allergies  Antimicrobials this admission: LVQ 10/31 x1, Azith 11/2>>11/3, CTX 11/2 x1, Flucon 11/3 x1 Zosyn 11/3>11/7 Vanc 11/10 x1; 11/24> Cefepime 11/10 >>11/16; 11/24>  Dose adjustments this admission: N/A  Microbiology results: 11/10 MRSA PCR: neg 11/10 Bcx: neg 11/18 BAL: rare GPCs, rare yeast >not treating per CCM 11/24 TA: rare GPCs, rare GNR 11/24 Bcx: sent  Thank you for allowing pharmacy to be a part of this patient's care.  Romilda Garret, PharmD PGY1 Acute Care Pharmacy Resident 09/22/2020 12:59 PM  Please check [email or web address] for unit specific pharmacy phone numbers.

## 2020-09-22 NOTE — Progress Notes (Signed)
eLink Physician-Brief Progress Note Patient Name: Candace [name] DOB: Jan 22, 1951 MRN: [id number]   Date of Service  09/22/2020  HPI/Events of Note  Notified of K 6.8 Previously on CRRT, for HD today  eICU Interventions  Ordered D50/insulin to shift Nephrology to be informed     Intervention Category Major Interventions: Electrolyte abnormality - evaluation and management  Judd Lien 09/22/2020, 5:39 AM

## 2020-09-22 NOTE — Progress Notes (Addendum)
NAMEGlorianna [name], MRN:  [id number], DOB:  10/25/1951, LOS: 21 ADMISSION DATE:  08/31/2020, CONSULTATION DATE:  09/07/2020 REFERRING MD:  Dr Candiss Norse, CHIEF COMPLAINT:  Acute resp failure  Brief History   69 year old female who was previously diagnosed with Covid 08/23/2020.  Admitted 11/2 with AF-RVR, found to have a perforated duodenal underwent exploratory laparotomy with Phillip Heal patch placement.  Past Medical History  Covid pneumonia Atrial fibrillation CKD stage III Diabetes mellitus Hypertension Colon cancer Hyperlipidemia  Significant Hospital Events   11/2 Admitted  11/3 OR -> perforated duodenal ulcer 11/10 progressive hemorrhagic shock, intubated, transfused, pressors, proned; started on CRRT in PM 11/16 Extubated. Re-intubated overnight due to respiratory distress and hypoxia with decreased mentation 11/18  bronch'd/ cultures sent 11/19 hgb down getting blood 09/22/2020 spiked fever resume empirical antimicrobial therapy  Consults:  Cardiology [facility] Nephrology   Procedures:  11/3 Exploratory laparotomy, Phillip Heal patch, lysis of adhesion for duodenal ulceration postop day 6  R PICC 11/5 >> A line 11/9 >> out ETT 11/9 > 11/16, 11/16 >> 09/21/2020 09/21/2020 tracheostomy>> Lt [place] CVL 11/9 >> R IJ trialysis >> out  Significant Diagnostic Tests:  11/3 CT abd/ pelvis > 1. Positive for bowel perforation: Pneumoperitoneum and intermediate density free fluid in the abdomen. Prior total colectomy. The specific site of perforation is unclear-oral contrast present to the proximal jejunum has not obviously leaked. Note that there may be small bowel loops adherent to the ventral abdominal wall along the greater curve of the stomach. 2. Extensive bilateral lower lung pneumonia. No pleural effusion. 3. Other abdominal and pelvic viscera are stable since 2015, including bilateral adrenal adenomas. Chronic renal parapelvic cysts. 4. Aortic Atherosclerosis 11/3 TTE > EF 70-75%, RV  not well visualized, mildly reduced RV systolic function 29/93 CT chest/ abd/ pelvis> 1. Interval progression of diffuse bilateral hazy ground-glass airspace opacities with more focal areas of consolidation at the lung bases 2. Trace bilateral pleural effusions. 3. Postsurgical changes the abdomen as detailed above. No evidence for a postoperative abscess, however evaluation is limited by lack of IV contrast. 4. There is a 1.9 cm cystic appearing lesion located in the pancreatic body. This was not present on the patient's CT from 2015.  Follow-up with an outpatient contrast enhanced MRI is recommended. 5. The endometrial stripe appears diffusely thickened. Follow-up with pelvic ultrasound is recommended. Aortic Atherosclerosis 11/14 LE doppler studies > + DVT of right posterior tibial and peroneal vein, +dVT of left posterior tibial vein   Micro Data:  11/10 MRSA PCR > neg 11/10 BC x 2 > neg 09/22/2020 blood cultures x2>> 05/22/2020 sputum culture>>  Antimicrobials:  azithro 11/2 >11/3 Ceftriaxone 11/2  Fluconazole 11/3 Zosyn 11/3 >> 11/7 Vanc 11/10off Cefepime 11/10 > 11/16  09/22/2020 vancomycin for fever 102 elevated white count>> 09/22/2020 cefepime for fever 102 elevated white count>>  Subjective:   Remains critically ill.  Now back on vasopressor support.  Having fever tachycardia and elevated white count. Objective   Blood pressure 103/63, pulse (!) 118, temperature (!) 101.2 F (38.4 C), resp. rate (!) 33, height 5\' 8"  (1.727 m), weight 87.4 kg, SpO2 93 %.    Vent Mode: PRVC FiO2 (%):  [60 %-70 %] 60 % Set Rate:  [35 bmp] 35 bmp Vt Set:  [510 mL] 510 mL PEEP:  [5 cmH20] 5 cmH20 Plateau Pressure:  [25 cmH20-35 cmH20] 25 cmH20   Intake/Output Summary (Last 24 hours) at 09/22/2020 0759 Last data filed at 09/22/2020 0400 Gross per 24 hour  Intake 2537.42 ml  Output 232 ml  Net 2305.42 ml   Filed Weights   09/19/20 0412 09/20/20 0418 09/21/20 0900  Weight: 84.7 kg  85.1 kg 87.4 kg    Physical Exam: General: Ill-appearing female full mechanical dilatory support HEENT: Tracheostomy is in place.  Pupils are equal reactive to light. Neuro: Heavily sedated with fentanyl drip CV: Heart sounds are tachycardic 114 PULM: Coarse rhonchi bilaterally Vent pressure regulated volume control FIO2 60% PEEP 5 RATE 35 decreased to 26 VT 510  GI: Abdominal dressing is in place GU: Amber urine Extremities: Positive edema Skin: no rashes or lesions   Assessment & Plan:   Acute hypoxic/hypercapnic respiratory failure due to ARDS from COVID-19 pneumonia c/b HCAP and failure to wean and failed extubation 11/16 Difficulty weaning off ventilator Plan Continue full ventilatory support Respiratory rate decreased to 26 for air trapping Sedation as needed Now has tracheostomy in place Will assess for long-term care facility in the future Currently she is going back on CRRT for rising creatinine rise in potassium and hypotension requiring Levophed. Family is aware of the critical nature of her illness. They have elected to continue aggressive interventions.    Multifactorial shock, previously hemorrhagic, concerned for sepsis vs sedation related hypotension 09/22/2020 back on vasopressor support there is a component of air trapping contributing Cultures NGTD Plan Noted to be tachycardic into the 130s Fever with noted to be 102 axillary Pan culture and start broad spectrum abx 11/24   Acute blood loss anemia  Recent Labs    09/21/20 0435 09/22/20 0342  HGB 8.0* 8.3*    Plan   Transfuse per protocol                                Anuric AKI on CRRT Plan Continue CVVHD at this time. Per nephrology if filter clots can attempt to transition to IHD.  Duodenal ulcer perforation status post ex lap and Graham's patch Plan Postop care per surgery  Paroxysmal A. fib, currently in sinus rhythm Plan Cardiac monitoring Consider resumption of  anticoagulation Heart rate is been increased questionable secondary pain from tracheostomy versus new infectious process. White count is noted to be elevated 16.5.  Thrombocytopenia-->38->90 Plan Platelets are improving Consider resumption of anticoagulation  Glucose control CBG (last 3)  Recent Labs    09/21/20 1936 09/21/20 2341 09/22/20 0345  GLUCAP 268* 208* 179*    Plan Continue Lantus and sliding scale insulin protocol   1.9 cm cystic appearing lesion located in the pancreatic body Plan Outpatient follow-up  History of stage IV colon cancer Status post pulmonary metastatectomies at [facility] + Chemo No further interventions at this time   Indeterminate bilateral DVT Patient had bilateral lower extremity Dopplers done, suggestive of possible indeterminate DVT, spoke with IR for IVC filter placement, after reevaluating ultrasound of lower extremities it was suggested patient does not have femoral/popliteal DVTs, so IVC filter was not placed Plan: Currently not on anticoagulation. Status post tracheostomy 09/21/2020 Continue to monitor  Best practice:  Diet: Tube feeds Pain/Anxiety/Delirium protocol (if indicated) midazolam fentanyl VAP protocol (if indicated): yes DVT prophylaxis: SCDs GI prophylaxis: Protonix BID Glucose control: Basal and bolus Mobility: Bedrest Code Status: Full Family Communication: family updated per MD Disposition: ICU  App cct 34 min  Richardson Landry Minor ACNP Acute Care Nurse Practitioner [place] Please consult [place] 09/22/2020, 8:00 AM   PCCM attending:  This is a 69 year old  female multiple medical problems for prolonged hospital stay, intubated for Covid ARDS had a perforated duodenal ulcer status post Phillip Heal patch.  Also has a history of stage IV colon cancer status post metastatic lesion resections from the lung plus chemoradiation several years ago.  Patient had to be reintubated after a failed extubation still  having ongoing respiratory failure and had to be reintubated.  Yesterday patient underwent tracheostomy tube placement.  I discussed with general surgery to leave on their list for consideration of PEG tube placement.  This morning she has an increase in her white blood cell count and fever.  Decision made for initiation of antimicrobials and reculture.  We looked at dates for lines.  She has a PICC line and a tunneled catheter.  No temporary lines.  Patient with small pleural effusions on chest x-ray from yesterday.  BP 95/69   Pulse (!) 119   Temp (!) 101.2 F (38.4 C)   Resp (!) 31   Ht 5\' 8"  (1.727 m)   Wt 87.4 kg   SpO2 95%   BMI 29.30 kg/m   General: Elderly chronically ill-appearing female tracheostomy tube in place Heart: Tachycardic regular rate rhythm Lungs: Biomechanically breath sounds Abdomene ostomy stable  Labs: White count 16,000 Other labs reviewed, potassium increasing  Assessment: Acute hypoxemic hypercarbic respiratory failure secondary to COVID-19 pneumonia requiring prolonged mechanical ventilation ultimately tracheostomy tube placement on 09/21/2020 for prolonged mechanical vent wean needs. Multifactorial etiology of shock during hospitalization.  Now back in shock again with fever and leukocytosis concern for a hospital-acquired infection. Acute blood loss anemia, hemoglobin now stable no overt sign of bleeding. Duodenal ulceration perforation status post Phillip Heal patch postop care per surgery Thrombocytopenia stable History of stage IV colon cancer at [facility] 2015-2018 AKI requiring CVVHD, now hyperkalemic and hypotensive again, tolerated HD x1 session now restarting CVVHD  Plan: Decrease sedation as much as tolerated for maintaining her comfortable on mechanical vent support. Reculture, blood sputum, urine Start broad-spectrum antibiotics Wean Levophed to maintain mean arterial pressure greater than 65 mmHg. Add vasopressin. Starting CVVHD per nephrology.   We appreciate their input.  This patient is critically ill with multiple organ system failure; which, requires frequent high complexity decision making, assessment, support, evaluation, and titration of therapies. This was completed through the application of advanced monitoring technologies and extensive interpretation of multiple databases. During this encounter critical care time was devoted to patient care services described in this note for 34 minutes.  Garner Nash, DO [facility] 09/22/2020 10:18 AM

## 2020-09-22 NOTE — Progress Notes (Addendum)
SLP Cancellation Note  Patient Details Name: Candace [name] MRN: [id number] DOB: 03-15-1951   Cancelled treatment:        Received orders for PMV and swallow evaluation. Trach placed yesterday. Pt is on ventilator and sedated per documentation (Precedex). Will follow and initiate assessments when able (inline if needed).   [name] 09/22/2020, 11:35 AM  [name] Caroli.Ed Risk analyst [phone] Office [phone]

## 2020-09-22 NOTE — Evaluation (Signed)
Physical Therapy RE-Evaluation Patient Details Name: Candace [name] MRN: [id number] DOB: 04/28/1951 Today's Date: 09/22/2020   History of Present Illness  69 y.o. female with medical history significant for recent covid pna, obesity, colon cancer s/p colon resection with colostomy bag, HLD, NIDDM2, CKD3 presented to ED after her follow-up nurse advised her to present to ED for elevated HR. +afib, elevated troponins with demand ischemia,  CT scan is positive for bowel perforation with pneumomediastinum 11/03 exp lap with repair of perforated ulcer. 11/10 t/f to ICU- intubated/sedated/proned. Trach placed 11/24.  Clinical Impression  Pt alert, following some commands slowly given increased processing time, trace movement in bil LEs and very weak.  She is now trached and still ventilated (FiO2 60%, PEEP 5, PRVC) and wast getting CRRT during our PT/OT co re-eval.  She will need significant post acute rehab once medically stable.   PT to follow acutely for deficits listed below.      Follow Up Recommendations SNF    Equipment Recommendations  Wheelchair (measurements PT);Wheelchair cushion (measurements PT);Hospital bed;Other (comment) (hoyer lift)    Recommendations for Other Services       Precautions / Restrictions Precautions Precautions: Fall Precaution Comments: baseline colostomy; trach-vent; CRRT Restrictions Weight Bearing Restrictions: No      Mobility  Bed Mobility Overal bed mobility: Needs Assistance Bed Mobility: Rolling Rolling: Total assist;+2 for physical assistance;+2 for safety/equipment (occasional +3 for line management (vent and CRRT))         General bed mobility comments: total A +2-3 needed for rolling this session due to pts ostomy bag bursting. OT/PT assisted pt in rolling as RN cleaned up pt and replaced ostomy.    Transfers                 General transfer comment: not able.  Ambulation/Gait                Stairs             Wheelchair Mobility    Modified Rankin (Stroke Patients Only)       Balance       Sitting balance - Comments: Pt positioned in high chair mode at end of session.  BPs soft but stable on 60% FiO2 and PEEP 5 on PRVC mode on vent.                                      Pertinent Vitals/Pain Pain Assessment: Faces Pain Score: 0-No pain Faces Pain Scale: Hurts little more Pain Location: abd Pain Descriptors / Indicators: Guarding;Operative site guarding Pain Intervention(s): Limited activity within patient's tolerance;Monitored during session;Repositioned    Home Living Family/patient expects to be discharged to:: Other (Comment) (LTAC?) Living Arrangements: Spouse/significant other;Children;Other relatives Available Help at Discharge: Family Type of Home: House Home Access: Stairs to enter Entrance Stairs-Rails: Psychiatric nurse of Steps: 4 Home Layout: One level Home Equipment: Bedside commode;Walker - 2 wheels Additional Comments: information taken from last OT evaluation. OT had previously signed off due to medical status    Prior Function Level of Independence: Independent         Comments: had recent admission- was requiring RW and assist to walk during this admission. Is independent at baseline     Hand Dominance        Extremity/Trunk Assessment   Upper Extremity Assessment Upper Extremity Assessment: Defer to OT evaluation RUE Deficits / Details: edematous  LUE Deficits / Details: weeping edema (RN aware)    Lower Extremity Assessment Lower Extremity Assessment: RLE deficits/detail;LLE deficits/detail RLE Deficits / Details: trace muscle activation in bil feet and knees.  Very weak and edematous (more so in bil UEs than LEs).  LLE Deficits / Details: trace muscle activation in bil feet and knees.  Very weak and edematous (more so in bil UEs than LEs).     Cervical / Trunk Assessment Cervical / Trunk Exceptions:  colostomy; open wound with packing s/p exp lap  Communication   Communication: Tracheostomy  Cognition Arousal/Alertness: Awake/alert Behavior During Therapy: Flat affect Overall Cognitive Status: Difficult to assess Area of Impairment: Problem solving;Following commands                       Following Commands: Follows one step commands with increased time     Problem Solving: Slow processing;Decreased initiation General Comments: Pt opening eyes and squeezing hand on command. Intermittently drowsy, but became more alert with mobility. Was able to follow simple commands and nod head yes/no. Was attempting to mouth some words      General Comments      Exercises Other Exercises Other Exercises: BUEs: AAROM of shoulders, elbow, wrist, hands with focus on edema reduction. UEs elevated at end of session Other Exercises: Bil LEs PROM ankle, knee, hip x 10 reps each bil.    Assessment/Plan    PT Assessment Patient needs continued PT services  PT Problem List Decreased strength;Decreased activity tolerance;Decreased balance;Decreased mobility;Decreased cognition;Decreased knowledge of precautions;Cardiopulmonary status limiting activity;Pain;Decreased safety awareness;Decreased knowledge of use of DME       PT Treatment Interventions DME instruction;Gait training;Stair training;Functional mobility training;Therapeutic activities;Therapeutic exercise;Balance training;Neuromuscular re-education;Cognitive remediation;Patient/family education    PT Goals (Current goals can be found in the Care Plan section)  Acute Rehab PT Goals Patient Stated Goal: previous goal to go home PT Goal Formulation: Patient unable to participate in goal setting Time For Goal Achievement: 10/06/20 Potential to Achieve Goals: Fair    Frequency Min 2X/week   Barriers to discharge        Co-evaluation PT/OT/SLP Co-Evaluation/Treatment: Yes Reason for Co-Treatment: Complexity of the patient's  impairments (multi-system involvement);Necessary to address cognition/behavior during functional activity;For patient/therapist safety;To address functional/ADL transfers PT goals addressed during session: Strengthening/ROM;Mobility/safety with mobility OT goals addressed during session: ADL's and self-care;Strengthening/ROM       AM-PAC PT "6 Clicks" Mobility  Outcome Measure Help needed turning from your back to your side while in a flat bed without using bedrails?: Total Help needed moving from lying on your back to sitting on the side of a flat bed without using bedrails?: Total Help needed moving to and from a bed to a chair (including a wheelchair)?: Total Help needed standing up from a chair using your arms (e.g., wheelchair or bedside chair)?: Total Help needed to walk in hospital room?: Total Help needed climbing 3-5 steps with a railing? : Total 6 Click Score: 6    End of Session Equipment Utilized During Treatment: Oxygen Activity Tolerance: Patient limited by fatigue Patient left: in bed;with call bell/phone within reach;with nursing/sitter in room   PT Visit Diagnosis: Muscle weakness (generalized) (M62.81);Difficulty in walking, not elsewhere classified (R26.2);Pain Pain - Right/Left:  (incisional/central) Pain - part of body:  (abdomen)    Time: 2633-3545 PT Time Calculation (min) (ACUTE ONLY): 39 min   Charges:   PT Evaluation $PT Re-evaluation: 1 Re-eval         Wells Guiles  Acie Fredrickson, DPT  Acute Rehabilitation [phone] pager [phone]) [phone] office

## 2020-09-22 NOTE — Evaluation (Signed)
Occupational Therapy Evaluation Patient Details Name: Candace [name] MRN: [id number] DOB: [birth year] Today's Date: 09/22/2020    History of Present Illness 69 y.o. female with medical history significant for recent covid pna, obesity, colon cancer s/p colon resection with colostomy bag, HLD, NIDDM2, CKD3 presented to ED after her follow-up nurse advised her to present to ED for elevated HR. +afib, elevated troponins with demand ischemia,  CT scan is positive for bowel perforation with pneumomediastinum 11/03 exp lap with repair of perforated ulcer. 11/10 t/f to ICU- intubated/sedated/proned. Trach placed 11/24.   Clinical Impression   Pt seen for OT eval after singing off with transfer to ICU 11/10. Pt presents with trach- vent (PEEP 5; FiO2 60%) and on CRRT. Colostomy bag had burst on entry to room. Pt required total A +2-3 to roll in bed for clean up with RN present. Bed level bath then completed with total A. Attempted to have pt engage in washing face, ultimately she required total A hand over hand guidance due to weakness and edema. Provided AAROM and edema massage to BUEs and elevated. LUE is weeping from edema, RN aware. Pt placed in chair position at end of session, BP reading 87/61. Throughout session, pt able to open eyes and follow simple commands. She became more alert with activity and was able to nod head yes/no and mouth some words. At this time, recommend LTACH at d/c for continued medical management and therapy prior to d/c home. Will continue to follow per POC listed below.     Follow Up Recommendations  LTACH;Supervision/Assistance - 24 hour    Equipment Recommendations  Wheelchair (measurements OT);Wheelchair cushion (measurements OT);Hospital bed    Recommendations for Other Services       Precautions / Restrictions Precautions Precautions: Fall Precaution Comments: baseline colostomy; trach-vent; CRRT Restrictions Weight Bearing Restrictions: No      Mobility Bed  Mobility Overal bed mobility: Needs Assistance Bed Mobility: Rolling Rolling: Total assist;+2 for physical assistance;+2 for safety/equipment (occasional +3)         General bed mobility comments: total A +2-3 needed for rolling this session due to pts ostomy beg bursting. OT/PT assisted pt in rolling as RN cleaned up pt and replaced ostomy.    Transfers                 General transfer comment: not able.    Balance                                           ADL either performed or assessed with clinical judgement   ADL Overall ADL's : Needs assistance/impaired                                       General ADL Comments: Pt is currently total A for all ADL. Attempted to have pt wash face with hand over hand total A. Difficulty actively engaging UEs due to global weakness and edema. Total A +2-3 for peri care and bed level bathing     Vision   Additional Comments: intermittently tracking therapist this date. Will continue to assess as pt more alert     Perception     Praxis      Pertinent Vitals/Pain Pain Assessment: Faces Pain Score: 0-No pain     Hand Dominance  Extremity/Trunk Assessment Upper Extremity Assessment Upper Extremity Assessment: Generalized weakness;LUE deficits/detail;RUE deficits/detail RUE Deficits / Details: edematous LUE Deficits / Details: weeping edema (RN aware)   Lower Extremity Assessment Lower Extremity Assessment: Defer to PT evaluation       Communication Communication Communication: No difficulties   Cognition Arousal/Alertness: Lethargic;Suspect due to medications Behavior During Therapy: Flat affect Overall Cognitive Status: Difficult to assess                                 General Comments: Pt opening eyes and squeezing hand on command. Intermittently drowsy, but became more alert with mobility. Was able to follow simple commands and nod head yes/no. Was attempting  to mouth some words   General Comments       Exercises Other Exercises Other Exercises: BUEs: AAROM of shoulders, elbow, wrist, hands with focus on edema reduction. UEs elevated at end of session   Shoulder Instructions      Home Living Family/patient expects to be discharged to:: Other (Comment) (LTACH) Living Arrangements: Spouse/significant other;Children;Other relatives Available Help at Discharge: Family Type of Home: House Home Access: Stairs to enter [employer] of Steps: 4 Entrance Stairs-Rails: Right;Left Home Layout: One level     Bathroom Shower/Tub: Teacher, early years/pre: Standard     Home Equipment: Bedside commode;Walker - 2 wheels   Additional Comments: information taken from last OT evaluation. OT had previously signed off due to medical status      Prior Functioning/Environment Level of Independence: Independent        Comments: had recent admission- was requiring RW and assist to walk during this admission. Is independent at baseline        OT Problem List: Decreased strength;Decreased knowledge of use of DME or AE;Decreased activity tolerance;Cardiopulmonary status limiting activity;Impaired balance (sitting and/or standing);Decreased safety awareness;Decreased cognition;Increased edema      OT Treatment/Interventions: Self-care/ADL training;Therapeutic exercise;Patient/family education;Balance training;Energy conservation;Therapeutic activities;DME and/or AE instruction;Cognitive remediation/compensation;Manual therapy    OT Goals(Current goals can be found in the care plan section) Acute Rehab OT Goals OT Goal Formulation: Patient unable to participate in goal setting Time For Goal Achievement: 10/06/20 Potential to Achieve Goals: Good  OT Frequency: Min 2X/week   Barriers to D/C:            Co-evaluation PT/OT/SLP Co-Evaluation/Treatment: Yes Reason for Co-Treatment: For patient/therapist safety;To address  functional/ADL transfers   OT goals addressed during session: ADL's and self-care;Strengthening/ROM      AM-PAC OT "6 Clicks" Daily Activity     Outcome Measure Help from another person eating meals?: Total Help from another person taking care of personal grooming?: Total Help from another person toileting, which includes using toliet, bedpan, or urinal?: Total Help from another person bathing (including washing, rinsing, drying)?: Total Help from another person to put on and taking off regular upper body clothing?: Total Help from another person to put on and taking off regular lower body clothing?: Total 6 Click Score: 6   End of Session Equipment Utilized During Treatment: Oxygen;Other (comment) (vent; CRRT) Nurse Communication: Mobility status  Activity Tolerance: Patient limited by fatigue Patient left: in bed;with call bell/phone within reach;with nursing/sitter in room  OT Visit Diagnosis: Unsteadiness on feet (R26.81);Other abnormalities of gait and mobility (R26.89);Muscle weakness (generalized) (M62.81);Other symptoms and signs involving cognitive function                Time: 0623-7628 OT Time Calculation (  min): 38 min Charges:  OT General Charges $OT Visit: 1 Visit OT Evaluation $OT Eval Moderate Complexity: 1 Mod OT Treatments $Self Care/Home Management : 8-22 mins  Zenovia Jarred, MSOT, OTR/L Acute Rehabilitation Services [facility] Office Number: [phone] Pager: [phone]  Zenovia Jarred 09/22/2020, 5:35 PM

## 2020-09-22 NOTE — Progress Notes (Signed)
[facility] Progress Note  Name: Candace [name] MRN: [id number] DOB: [birth year]  Subjective:  No UOP charted.  CRRT stopped yesterday per my order after circuit clotted.  Interim labs with hyperkalemia and she is now back on levo at 14 mcg/min.  She had a trach.   Review of systems:  Unable to obtain 2/2 obtunded, vent   Intake/Output Summary (Last 24 hours) at 09/22/2020 0748 Last data filed at 09/22/2020 0400 Gross per 24 hour  Intake 2537.42 ml  Output 232 ml  Net 2305.42 ml    Vitals:  Vitals:   09/22/20 0700 09/22/20 0715 09/22/20 0723 09/22/20 0745  BP: (!) 86/58 103/63    Pulse: (!) 122 (!) 120 (!) 119 (!) 118  Resp: (!) 34 (!) 35 (!) 34 (!) 33  Temp:    (!) 101.2 F (38.4 C)  TempSrc:      SpO2: 92% 91% 92% 93%  Weight:      Height:         Physical Exam:   General adult female in bed critically ill   HEENT normocephalic atraumatic Neck trach in place; increased neck circumference Lungs coarse mechanical breath sounds; on vent Heart S1S2 tachy no rub appreciated Abdomen soft obese habitus Extremities no pitting edema lower extremities and 2+ edema upper extr Neuro - sedation currently running Access left chest nontunneled catheter    Medications reviewed   Labs:  BMP Latest Ref Rng & Units 09/22/2020 09/22/2020 09/21/2020  Glucose 70 - 99 mg/dL - 239(H) 132(H)  BUN 8 - 23 mg/dL - 79(H) 27(H)  Creatinine 0.44 - 1.00 mg/dL - 1.31(H) 0.38(L)  Sodium 135 - 145 mmol/L - 133(L) 135  Potassium 3.5 - 5.1 mmol/L 6.0(H) 6.8(HH) 3.6  Chloride 98 - 111 mmol/L - 102 102  CO2 22 - 32 mmol/L - 21(L) 26  Calcium 8.9 - 10.3 mg/dL - 8.5(L) 8.3(L)     Assessment/Plan:   1.Acute kidney injury, oliguric likely ATN due to hemorrhagic shock: - Patient initially had AKI with creatinine level of 5.6 on admission due to A. fib with RVR/hemodynamic instability which was improved. However she developed another episode of AKI after shock.  Urinalysis was  contaminated/UTI. CT scan A/P without hydronephrosis. Started CRRT on 11/10 because of oliguria and hyperkalemia.   - Resume CRRT  - 2K dialysate; keep even as tolerated for now   2.Hyperkalemia due to AKI and intra-abdominal bleeding/PRBC transfusion: for CRRT as above; 2K dialysate to start   3.Hypophosphatemia: resolved and now hyperphosphatemic    4.Hemorrhagic shock: s/p PRBC's.  Maintain MAP >65  5.Perforated duodenal ulcer status post ex lap: per general surgery. Feeds per primary  6.ARDS/recent Covid pneumonia: mechanical ventilation per primary team   7. Anemia 2/2 AKI and hemorrhagic shock - feraheme 510 mg IV once on 11/23.  Got aranesp 40 mcg on 11/23 and ordered every Tuesday  Claudia Desanctis, MD 09/22/2020  8:01 AM

## 2020-09-22 NOTE — Progress Notes (Signed)
Pharmacy Antibiotic Note  Candace [name] is a 69 y.o. female admitted on 08/31/2020 with afib with RVR now with possible sepsis. WBC 16.5, Tmax 102.7, pressor requirement is increasing. Pharmacy has been consulted for vancomycin and cefepime dosing.  Patient's CRRT was stopped 11/23 _0  and was resumed 11/24 _1 .  Plan: Vancomycin 1000 mg IV x1 (prior to CRRT start), followed by 1000 mg IV every 24h while on CRRT Cefepime 1g IV x1 (prior to CRRT start), followed by 2g IV every 12 hours Monitor CRRT start, C&S, clinical status  Height: _2  (172.7 cm) Weight: 87.4 kg (192 lb 10.9 oz) IBW/kg (Calculated) : 63.9  Temp (24hrs), Avg:101.4 F (38.6 C), Min:100.2 F (37.9 C), Max:102.7 F (39.3 C)  Recent Labs  Lab 09/17/20 1613 09/18/20 0330 09/19/20 0504 09/19/20 1629 09/20/20 0428 09/20/20 1541 09/20/20 1707 09/21/20 0435 09/22/20 0342  WBC 18.2*  --  14.4*  --  9.3  --   --  6.6 16.5*  CREATININE 0.47   < > 0.48   < > 0.46 0.43* 0.49 0.38* 1.31*   < > = values in this interval not displayed.    Estimated Creatinine Clearance: 46.9 mL/min (A) (by C-G formula based on SCr of 1.31 mg/dL (H)).    No Known Allergies  Antimicrobials this admission: LVQ 10/31 x1, Azith 11/2>>11/3, CTX 11/2 x1, Flucon 11/3 x1 Zosyn 11/3>11/7 Vanc 11/10 x1; 11/24> Cefepime 11/10 >>11/16; 11/24>  Microbiology results: 11/10 MRSA PCR: neg 11/10 Bcx: neg 11/18 BAL: rare GPCs, rare yeast >not treating per CCM 11/24 TA: rare GPCs, rare GNR 11/24 Bcx: sent  Thank you for allowing pharmacy to be a part of this patient's care.  Romilda Garret, PharmD PGY1 Acute Care Pharmacy Resident 09/22/2020 2:10 PM  Please check [email or web address] for unit specific pharmacy phone numbers.

## 2020-09-23 ENCOUNTER — Inpatient Hospital Stay (HOSPITAL_COMMUNITY): Payer: Medicare Other

## 2020-09-23 LAB — RENAL FUNCTION PANEL
Albumin: 1.2 g/dL — ABNORMAL LOW (ref 3.5–5.0)
Albumin: 1.3 g/dL — ABNORMAL LOW (ref 3.5–5.0)
Anion gap: 10 (ref 5–15)
Anion gap: 11 (ref 5–15)
BUN: 53 mg/dL — ABNORMAL HIGH (ref 8–23)
BUN: 41 mg/dL — ABNORMAL HIGH (ref 8–23)
CO2: 23 mmol/L (ref 22–32)
CO2: 22 mmol/L (ref 22–32)
Calcium: 8.6 mg/dL — ABNORMAL LOW (ref 8.9–10.3)
Calcium: 8.8 mg/dL — ABNORMAL LOW (ref 8.9–10.3)
Chloride: 101 mmol/L (ref 98–111)
Chloride: 100 mmol/L (ref 98–111)
Creatinine, Ser: 0.62 mg/dL (ref 0.44–1.00)
Creatinine, Ser: 0.66 mg/dL (ref 0.44–1.00)
GFR, Estimated: >60 mL/min (ref >60)
GFR, Estimated: >60 mL/min (ref >60)
Glucose, Bld: 208 mg/dL — ABNORMAL HIGH (ref 70–99)
Glucose, Bld: 118 mg/dL — ABNORMAL HIGH (ref 70–99)
Phosphorus: 3.9 mg/dL (ref 2.5–4.6)
Phosphorus: 3.6 mg/dL (ref 2.5–4.6)
Potassium: 3.7 mmol/L (ref 3.5–5.1)
Potassium: 4.1 mmol/L (ref 3.5–5.1)
Sodium: 134 mmol/L — ABNORMAL LOW (ref 135–145)
Sodium: 133 mmol/L — ABNORMAL LOW (ref 135–145)

## 2020-09-23 LAB — CBC WITH DIFFERENTIAL/PLATELET
Abs Immature Granulocytes: 0.22 10*3/uL — ABNORMAL HIGH (ref 0.00–0.07)
Basophils Absolute: 0 10*3/uL (ref 0.0–0.1)
Basophils Relative: 0 %
Eosinophils Absolute: 0 10*3/uL (ref 0.0–0.5)
Eosinophils Relative: 0 %
HCT: 25.6 % — ABNORMAL LOW (ref 36.0–46.0)
Hemoglobin: 7.7 g/dL — ABNORMAL LOW (ref 12.0–15.0)
Immature Granulocytes: 1 %
Lymphocytes Relative: 2 %
Lymphs Abs: 0.4 10*3/uL — ABNORMAL LOW (ref 0.7–4.0)
MCH: 26.9 pg (ref 26.0–34.0)
MCHC: 30.1 g/dL (ref 30.0–36.0)
MCV: 89.5 fL (ref 80.0–100.0)
Monocytes Absolute: 0.5 10*3/uL (ref 0.1–1.0)
Monocytes Relative: 3 %
Neutro Abs: 17.5 10*3/uL — ABNORMAL HIGH (ref 1.7–7.7)
Neutrophils Relative %: 94 %
Platelets: 79 10*3/uL — ABNORMAL LOW (ref 150–400)
RBC: 2.86 MIL/uL — ABNORMAL LOW (ref 3.87–5.11)
RDW: 21.7 % — ABNORMAL HIGH (ref 11.5–15.5)
WBC: 18.7 10*3/uL — ABNORMAL HIGH (ref 4.0–10.5)
nRBC: 1 % — ABNORMAL HIGH (ref 0.0–0.2)

## 2020-09-23 LAB — POTASSIUM
Potassium: 3.6 mmol/L (ref 3.5–5.1)
Potassium: 3.9 mmol/L (ref 3.5–5.1)
Potassium: 4.1 mmol/L (ref 3.5–5.1)
Potassium: 4.2 mmol/L (ref 3.5–5.1)
Potassium: 4.7 mmol/L (ref 3.5–5.1)

## 2020-09-23 LAB — GLUCOSE, CAPILLARY
Glucose-Capillary: 128 mg/dL — ABNORMAL HIGH (ref 70–99)
Glucose-Capillary: 132 mg/dL — ABNORMAL HIGH (ref 70–99)
Glucose-Capillary: 142 mg/dL — ABNORMAL HIGH (ref 70–99)
Glucose-Capillary: 58 mg/dL — ABNORMAL LOW (ref 70–99)
Glucose-Capillary: 86 mg/dL (ref 70–99)
Glucose-Capillary: 92 mg/dL (ref 70–99)
Glucose-Capillary: 97 mg/dL (ref 70–99)

## 2020-09-23 LAB — MAGNESIUM: Magnesium: 2.4 mg/dL (ref 1.7–2.4)

## 2020-09-23 MED ORDER — ALBUMIN HUMAN 25 % IV SOLN
50.0000 g | Freq: Once | INTRAVENOUS | Status: AC
  Administered 2020-09-23: 50 g via INTRAVENOUS
  Filled 2020-09-23: qty 200

## 2020-09-23 MED ORDER — ATROPINE SULFATE 1 MG/10ML IJ SOSY
PREFILLED_SYRINGE | INTRAMUSCULAR | Status: AC
  Filled 2020-09-23: qty 10

## 2020-09-23 MED ORDER — DARBEPOETIN ALFA 60 MCG/0.3ML IJ SOSY
60.0000 ug | PREFILLED_SYRINGE | INTRAMUSCULAR | Status: DC
  Administered 2020-09-28 – 2020-10-19 (×4): 60 ug via SUBCUTANEOUS
  Filled 2020-09-23 (×5): qty 0.3

## 2020-09-23 MED ORDER — PRISMASOL BGK 4/2.5 32-4-2.5 MEQ/L EC SOLN
Status: DC
  Filled 2020-09-23 (×64): qty 5000

## 2020-09-23 NOTE — Progress Notes (Addendum)
Video call with family  Patient tachypnic during visit. Bedside RN at bedside providing emotional support.

## 2020-09-23 NOTE — Progress Notes (Signed)
[facility] Progress Note  Name: Candace [name] MRN: [id number] DOB: 04-Nov-1950  Subjective:  Seen and examined on CRRT; procedure supervised.  No UOP charted.  CRRT restarted yesterday after hyperkalemia and recurrent pressor requirement.  She is down to levo at 5 mcg/min.  She had 2.1 liters UF over 11/24 with CRRT.   Review of systems:  Unable to obtain 2/2 vent, sedation   Intake/Output Summary (Last 24 hours) at 09/23/2020 0807 Last data filed at 09/23/2020 0800 Gross per 24 hour  Intake 2548.08 ml  Output 2597 ml  Net -48.92 ml    Vitals:  Vitals:   09/23/20 0707 09/23/20 0724 09/23/20 0725 09/23/20 0800  BP:  107/68  (!) 88/61  Pulse:  90  90  Resp: (!) 26 (!) 29  (!) 27  Temp:      TempSrc:      SpO2:  91% 91% 92%  Weight:      Height:         Physical Exam:    General adult female in bed critically ill   HEENT normocephalic atraumatic Neck trach in place; increased neck circumference Lungs mechanical breath sounds; on vent Heart S1S2 tachy no rub appreciated Abdomen soft obese habitus Extremities trace pitting edema lower extremities and 2+ edema upper extr Neuro - sedation currently running but able to show me two fingers, her thumb, and shut/open her eyes for me on command Access left chest nontunneled catheter    Medications reviewed   Labs:  BMP Latest Ref Rng & Units 09/23/2020 09/22/2020 09/22/2020  Glucose 70 - 99 mg/dL 208(H) - -  BUN 8 - 23 mg/dL 53(H) - -  Creatinine 0.44 - 1.00 mg/dL 0.62 - -  Sodium 135 - 145 mmol/L 134(L) - -  Potassium 3.5 - 5.1 mmol/L 3.7 3.9 4.7  Chloride 98 - 111 mmol/L 101 - -  CO2 22 - 32 mmol/L 23 - -  Calcium 8.9 - 10.3 mg/dL 8.6(L) - -     Assessment/Plan:   1.Acute kidney injury, oliguric likely ATN due to hemorrhagic shock: - Patient initially had AKI with creatinine level of 5.6 on admission due to A. fib with RVR/hemodynamic instability which was improved. However she developed another  episode of AKI after shock.  Urinalysis was contaminated/UTI. CT scan A/P without hydronephrosis. Started CRRT on 11/10 because of oliguria and hyperkalemia.   - Continue CRRT - reduce dialysate to 1.5 liters/hr   - Change back to 4K dialysate; keep even to net neg 50 ml/hr as tolerated  - She had a trach around the time we attempted to transition to [facility] and was previously also on higher K feeds.  Reassess transition to iHD daily   2.Hyperkalemia due to AKI and intra-abdominal bleeding/PRBC transfusion.  Resolved back on CRRT.  Transition to 4K dialysate.  Changed feeds to nepro  3.Hypophosphatemia: hyperphosphatemic on 11/24 after stopping CRRT. Now acceptable.  Trend per CRRT protocol and replace as needed   4.Hemorrhagic shock: s/p PRBC's.  Maintain MAP >65  5.Perforated duodenal ulcer status post ex lap: per general surgery. Feeds per primary  6.ARDS/recent Covid pneumonia: mechanical ventilation per primary team   7. Anemia 2/2 AKI and hemorrhagic shock - feraheme 510 mg IV once on 11/23.  First got aranesp 40 mcg on 11/23 and will increase to 60 mcg for next dose. Ordered every Tuesday  Claudia Desanctis, MD 09/23/2020  8:22 AM

## 2020-09-23 NOTE — Progress Notes (Signed)
eLink Physician-Brief Progress Note Patient Name: Candace [name] DOB: 07-13-1951 MRN: [id number]   Date of Service  09/23/2020  HPI/Events of Note  Hypotensive with increase in pressor requirements from 5 mcg to 60 mcg, CVP 3, serum albumin 1.3 gm / dl. Patient is on CRRT with 50 ml / hour of ultrafiltration.  eICU Interventions  Albumin 25 % 50 gm iv x 1, hold ultrafiltration, re-check CVP after albumin bolus, If CVP and blood pressure remain low after albumin bolus, will give a small crystalloid bolus as well, continue CRRT without ultrafiltration.        Kerry Kass Audryana Hockenberry 09/23/2020, 8:42 PM

## 2020-09-23 NOTE — Progress Notes (Signed)
NAMEKaralina [name], MRN:  [id number], DOB:  Feb 12, 1951, LOS: 78 ADMISSION DATE:  08/31/2020, CONSULTATION DATE:  09/07/2020 REFERRING MD:  Dr Candiss Norse, CHIEF COMPLAINT:  Acute resp failure  Brief History   69 year old female who was previously diagnosed with Covid 08/23/2020.  Admitted 11/2 with AF-RVR, found to have a perforated duodenal underwent exploratory laparotomy with Phillip Heal patch placement.  Past Medical History  Covid pneumonia Atrial fibrillation CKD stage III Diabetes mellitus Hypertension Colon cancer Hyperlipidemia  Significant Hospital Events   11/2 Admitted  11/3 OR -> perforated duodenal ulcer 11/10 progressive hemorrhagic shock, intubated, transfused, pressors, proned; started on CRRT in PM 11/16 Extubated. Re-intubated overnight due to respiratory distress and hypoxia with decreased mentation 11/18  bronch'd/ cultures sent 11/19 hgb down getting blood 09/22/2020 spiked fever resume empirical antimicrobial therapy  Consults:  Cardiology [facility] Nephrology   Procedures:  11/3 Exploratory laparotomy, Phillip Heal patch, lysis of adhesion for duodenal ulceration postop day 6  R PICC 11/5 >> A line 11/9 >> out ETT 11/9 > 11/16, 11/16 >> 09/21/2020 09/21/2020 tracheostomy>> Lt [place] CVL 11/9 >> R IJ trialysis >> out  Significant Diagnostic Tests:  11/3 CT abd/ pelvis > 1. Positive for bowel perforation: Pneumoperitoneum and intermediate density free fluid in the abdomen. Prior total colectomy. The specific site of perforation is unclear-oral contrast present to the proximal jejunum has not obviously leaked. Note that there may be small bowel loops adherent to the ventral abdominal wall along the greater curve of the stomach. 2. Extensive bilateral lower lung pneumonia. No pleural effusion. 3. Other abdominal and pelvic viscera are stable since 2015, including bilateral adrenal adenomas. Chronic renal parapelvic cysts. 4. Aortic Atherosclerosis 11/3 TTE > EF 70-75%, RV  not well visualized, mildly reduced RV systolic function 10/93 CT chest/ abd/ pelvis> 1. Interval progression of diffuse bilateral hazy ground-glass airspace opacities with more focal areas of consolidation at the lung bases 2. Trace bilateral pleural effusions. 3. Postsurgical changes the abdomen as detailed above. No evidence for a postoperative abscess, however evaluation is limited by lack of IV contrast. 4. There is a 1.9 cm cystic appearing lesion located in the pancreatic body. This was not present on the patient's CT from 2015.  Follow-up with an outpatient contrast enhanced MRI is recommended. 5. The endometrial stripe appears diffusely thickened. Follow-up with pelvic ultrasound is recommended. Aortic Atherosclerosis 11/14 LE doppler studies > + DVT of right posterior tibial and peroneal vein, +dVT of left posterior tibial vein   Micro Data:  11/10 MRSA PCR > neg 11/10 BC x 2 > neg 09/22/2020 blood cultures x2>> 05/22/2020 sputum culture>>  Antimicrobials:  azithro 11/2 >11/3 Ceftriaxone 11/2  Fluconazole 11/3 Zosyn 11/3 >> 11/7 Vanc 11/10off Cefepime 11/10 > 11/16  09/22/2020 vancomycin for fever 102 elevated white count>> 09/22/2020 cefepime for fever 102 elevated white count>>  Subjective:   Patient was restarted on CVVHD.  Coming down on continuous sedation needs.  Objective   Blood pressure 107/68, pulse 90, temperature (!) 90.7 F (32.6 C), temperature source Axillary, resp. rate (!) 29, height 5\' 8"  (1.727 m), weight 87.4 kg, SpO2 91 %.    Vent Mode: PRVC FiO2 (%):  [50 %-60 %] 60 % Set Rate:  [26 bmp] 26 bmp Vt Set:  [510 mL] 510 mL PEEP:  [5 cmH20] 5 cmH20 Plateau Pressure:  [25 ATF57-32 cmH20] 25 cmH20   Intake/Output Summary (Last 24 hours) at 09/23/2020 0728 Last data filed at 09/23/2020 0700 Gross per 24 hour  Intake 2511.15  ml  Output 2377 ml  Net 134.15 ml   Filed Weights   09/19/20 0412 09/20/20 0418 09/21/20 0900  Weight: 84.7 kg 85.1 kg  87.4 kg    Physical Exam: General: Ill-appearing female, tracheostomy tube in place on mechanical ventilation HEENT: Tracheostomy tube in place CDI no significant secretions or bleeding Neuro: Sedated, will open eyes to voice attempt to follow commands CV: Regular rate rhythm, S1-S2 PULM: Bilateral ventilated breath sounds GI: Ileostomy GU: Foley Extremities: Upper and lower extremity dependent edema Skin: No significant rash, skin tear on face from tube holder   Assessment & Plan:   Acute hypoxic/hypercapnic respiratory failure due to ARDS from COVID-19 pneumonia c/b HCAP and failure to wean and failed extubation 11/16 Difficulty weaning off ventilator Plan Patient still having prolonged mechanical vent wean needs. Tracheostomy tube in place Continue to wean PEEP and FiO2 as tolerated. Attempt SBT SAT daily. Likely will need prolonged long-term care facility for vent wean scenario. Patient would be a good candidate for long-term care. She will also need a place will potentially need renal replacement support. We will try again once off pressors to be able to tolerate HD. Discussed with nephrology.  Multifactorial shock, previously hemorrhagic, concerned for sepsis vs sedation related hypotension  Plan Repeat cultures pending No additional fevers Continue broad-spectrum antibiotics   Acute blood loss anemia  Plan  On and off increasing and decreasing hemoglobin levels. No sign of active bleeding Continue conservative transfusion threshold for hemoglobin less than 7.  Anuric AKI on CRRT Plan Continue CVVHD per nephrology  Duodenal ulcer perforation status post ex lap and Graham's patch Plan Postop care per surgery  Paroxysmal A. fib, currently in sinus rhythm Plan Currently in sinus rhythm. Continue to observe  Thrombocytopenia Plan Stable continue to observe  Glucose control Plan Lantus plus sliding scale  1.9 cm cystic appearing lesion located in the  pancreatic body Plan Outpatient follow-up  History of stage IV colon cancer Status post pulmonary metastatectomies at [facility] + Chemo Outpatient follow-up  Indeterminate bilateral DVT Patient had bilateral lower extremity Dopplers done, suggestive of possible indeterminate DVT, spoke with IR for IVC filter placement, after reevaluating ultrasound of lower extremities it was suggested patient does not have femoral/popliteal DVTs, so IVC filter was not placed Plan: No anticoagulation needed  Best practice:  Diet: Tube feeds Pain/Anxiety/Delirium protocol (if indicated) midazolam fentanyl VAP protocol (if indicated): yes DVT prophylaxis: SCDs GI prophylaxis: Protonix Glucose control: lantus + ssi  Mobility: Bedrest Code Status: Full Family Communication: updated at bedside yesterday  Disposition: ICU  This patient is critically ill with multiple organ system failure; which, requires frequent high complexity decision making, assessment, support, evaluation, and titration of therapies. This was completed through the application of advanced monitoring technologies and extensive interpretation of multiple databases. During this encounter critical care time was devoted to patient care services described in this note for 32 minutes.  Garner Nash, DO [facility] 09/23/2020 7:28 AM

## 2020-09-23 NOTE — Plan of Care (Signed)
  Problem: Education: Goal: Utilization of techniques to improve thought processes will improve Outcome: Not Progressing Goal: Knowledge of the prescribed therapeutic regimen will improve Outcome: Not Progressing   Problem: Activity: Goal: Interest or engagement in leisure activities will improve Outcome: Not Progressing Goal: Imbalance in normal sleep/wake cycle will improve Outcome: Not Progressing   Problem: Coping: Goal: Coping ability will improve Outcome: Not Progressing Goal: Will verbalize feelings Outcome: Not Progressing   Problem: Health Behavior/Discharge Planning: Goal: Ability to make decisions will improve Outcome: Not Progressing Goal: Compliance with therapeutic regimen will improve Outcome: Not Progressing   Problem: Safety: Goal: Ability to disclose and discuss suicidal ideas will improve Outcome: Not Progressing Goal: Ability to identify and utilize support systems that promote safety will improve Outcome: Not Progressing   Problem: Clinical Measurements: Goal: Ability to maintain clinical measurements within normal limits will improve Outcome: Not Progressing Goal: Will remain free from infection Outcome: Progressing Goal: Diagnostic test results will improve Outcome: Progressing Goal: Respiratory complications will improve Outcome: Not Progressing Goal: Cardiovascular complication will be avoided Outcome: Progressing   Problem: Activity: Goal: Risk for activity intolerance will decrease Outcome: Not Progressing

## 2020-09-24 ENCOUNTER — Inpatient Hospital Stay (HOSPITAL_COMMUNITY): Payer: Medicare Other

## 2020-09-24 LAB — CBC
HCT: 19 % — ABNORMAL LOW (ref 36.0–46.0)
Hemoglobin: 5.6 g/dL — CL (ref 12.0–15.0)
MCH: 26.8 pg (ref 26.0–34.0)
MCHC: 29.5 g/dL — ABNORMAL LOW (ref 30.0–36.0)
MCV: 90.9 fL (ref 80.0–100.0)
Platelets: 105 10*3/uL — ABNORMAL LOW (ref 150–400)
RBC: 2.09 MIL/uL — ABNORMAL LOW (ref 3.87–5.11)
RDW: 21.5 % — ABNORMAL HIGH (ref 11.5–15.5)
WBC: 13.2 10*3/uL — ABNORMAL HIGH (ref 4.0–10.5)
nRBC: 2 % — ABNORMAL HIGH (ref 0.0–0.2)

## 2020-09-24 LAB — RENAL FUNCTION PANEL
Albumin: 2.2 g/dL — ABNORMAL LOW (ref 3.5–5.0)
Albumin: 1.9 g/dL — ABNORMAL LOW (ref 3.5–5.0)
Anion gap: 13 (ref 5–15)
Anion gap: 9 (ref 5–15)
BUN: 38 mg/dL — ABNORMAL HIGH (ref 8–23)
BUN: 37 mg/dL — ABNORMAL HIGH (ref 8–23)
CO2: 22 mmol/L (ref 22–32)
CO2: 25 mmol/L (ref 22–32)
Calcium: 9.1 mg/dL (ref 8.9–10.3)
Calcium: 9.2 mg/dL (ref 8.9–10.3)
Chloride: 100 mmol/L (ref 98–111)
Chloride: 99 mmol/L (ref 98–111)
Creatinine, Ser: 0.7 mg/dL (ref 0.44–1.00)
Creatinine, Ser: 0.63 mg/dL (ref 0.44–1.00)
GFR, Estimated: >60 mL/min (ref >60)
GFR, Estimated: >60 mL/min (ref >60)
Glucose, Bld: 235 mg/dL — ABNORMAL HIGH (ref 70–99)
Glucose, Bld: 122 mg/dL — ABNORMAL HIGH (ref 70–99)
Phosphorus: 3 mg/dL (ref 2.5–4.6)
Phosphorus: 3 mg/dL (ref 2.5–4.6)
Potassium: 4.2 mmol/L (ref 3.5–5.1)
Potassium: 3.7 mmol/L (ref 3.5–5.1)
Sodium: 135 mmol/L (ref 135–145)
Sodium: 133 mmol/L — ABNORMAL LOW (ref 135–145)

## 2020-09-24 LAB — GLUCOSE, CAPILLARY
Glucose-Capillary: 169 mg/dL — ABNORMAL HIGH (ref 70–99)
Glucose-Capillary: 186 mg/dL — ABNORMAL HIGH (ref 70–99)
Glucose-Capillary: 112 mg/dL — ABNORMAL HIGH (ref 70–99)
Glucose-Capillary: 127 mg/dL — ABNORMAL HIGH (ref 70–99)
Glucose-Capillary: 96 mg/dL (ref 70–99)

## 2020-09-24 LAB — CULTURE, RESPIRATORY W GRAM STAIN

## 2020-09-24 LAB — HEMOGLOBIN AND HEMATOCRIT, BLOOD
HCT: 22.6 % — ABNORMAL LOW (ref 36.0–46.0)
HCT: 29.9 % — ABNORMAL LOW (ref 36.0–46.0)
Hemoglobin: 7 g/dL — ABNORMAL LOW (ref 12.0–15.0)
Hemoglobin: 9.4 g/dL — ABNORMAL LOW (ref 12.0–15.0)

## 2020-09-24 LAB — PREPARE RBC (CROSSMATCH)

## 2020-09-24 LAB — MAGNESIUM: Magnesium: 2.5 mg/dL — ABNORMAL HIGH (ref 1.7–2.4)

## 2020-09-24 LAB — POTASSIUM: Potassium: 4.4 mmol/L (ref 3.5–5.1)

## 2020-09-24 MED ORDER — CEFAZOLIN SODIUM-DEXTROSE 2-4 GM/100ML-% IV SOLN
2.0000 g | Freq: Two times a day (BID) | INTRAVENOUS | Status: DC
  Administered 2020-09-24: 2 g via INTRAVENOUS
  Filled 2020-09-24: qty 100

## 2020-09-24 MED ORDER — SODIUM CHLORIDE 0.9% IV SOLUTION: Freq: Once | INTRAVENOUS | Status: AC

## 2020-09-24 NOTE — Progress Notes (Signed)
RT note- Patient transferred to CT and back to 3M07, remains on current ventilator settings.

## 2020-09-24 NOTE — Progress Notes (Signed)
[facility] Progress Note  Name: Aribelle Mccosh MRN: [id number] DOB: February 03, 1951  Subjective:   patient was not on CRRT on my evaluation no UOP charted.  No urine output.  Continuing on CRRT.  Pressor requirement worsened overnight.  Anemia worse this morning  Review of systems:  Unable to obtain 2/2 vent, sedation   Intake/Output Summary (Last 24 hours) at 09/24/2020 1009 Last data filed at 09/24/2020 0916 Gross per 24 hour  Intake 4345.09 ml  Output 1579 ml  Net 2766.09 ml    Vitals:  Vitals:   09/24/20 0915 09/24/20 0930 09/24/20 0937 09/24/20 0945  BP: (!) 111/55 (!) 115/55  (!) 113/52  Pulse: (!) 120 (!) 112 (!) 108 (!) 104  Resp: (!) 22 (!) 22 (!) 23 (!) 26  Temp:   99.4 F (37.4 C)   TempSrc:   Axillary   SpO2: 100% 100% 100% 100%  Weight:      Height:         Physical Exam:    GEN: Ill-appearing, lying in bed, no distress ENT: no nasal discharge, mmm EYES: no scleral icterus, does not move eyes to command CV: normal rate, no audible murmur PULM: Coarse bilateral breath sounds, ventilated, bilateral chest rise ABD: NABS, non-distended SKIN: Trace pitting edema in the bilateral lower extremities, warm and well perfused Access: Left IJ nontunneled catheter   Scheduled Meds: . sodium chloride   Intravenous Once  . sodium chloride   Intravenous Once  . B-complex with vitamin C  1 tablet Per Tube Daily  . chlorhexidine gluconate (MEDLINE KIT)  15 mL Mouth Rinse BID  . Chlorhexidine Gluconate Cloth  6 each Topical Daily  . chlorpheniramine-HYDROcodone  5 mL Per Tube Q12H  . [START ON 09/28/2020] darbepoetin (ARANESP) injection - DIALYSIS  60 mcg Subcutaneous Q Tue-1800  . feeding supplement (PROSource TF)  45 mL Per Tube BID  . insulin aspart  0-20 Units Subcutaneous Q4H  . insulin aspart  8 Units Subcutaneous Q4H  . insulin detemir  25 Units Subcutaneous BID  . mouth rinse  15 mL Mouth Rinse 10 times per day  . oxyCODONE  10 mg Per Tube Q6H  .  pantoprazole sodium  40 mg Per Tube BID  . sodium chloride flush  10-40 mL Intracatheter Q12H   Continuous Infusions: .  prismasol BGK 4/2.5 400 mL/hr at 09/24/20 0045  .  prismasol BGK 4/2.5 300 mL/hr at 09/23/20 1607  . sodium chloride Stopped (09/19/20 1731)  .  ceFAZolin (ANCEF) IV    . feeding supplement (NEPRO CARB STEADY) 65 mL/hr at 09/24/20 0700  . fentaNYL infusion INTRAVENOUS 200 mcg/hr (09/24/20 0900)  . norepinephrine (LEVOPHED) Adult infusion 6 mcg/min (09/24/20 0900)  . prismasol BGK 4/2.5 1,500 mL/hr at 09/24/20 0538  . vancomycin Stopped (09/23/20 2108)  . vasopressin Stopped (09/24/20 0335)   PRN Meds:.sodium chloride, acetaminophen (TYLENOL) oral liquid 160 mg/5 mL, albuterol, bacitracin, diphenhydrAMINE, fentaNYL, heparin, midazolam, phenol, silver nitrate applicators, sodium chloride, sodium chloride flush   Labs:  BMP Latest Ref Rng & Units 09/24/2020 09/23/2020 09/23/2020  Glucose 70 - 99 mg/dL 235(H) - 118(H)  BUN 8 - 23 mg/dL 38(H) - 41(H)  Creatinine 0.44 - 1.00 mg/dL 0.70 - 0.66  Sodium 135 - 145 mmol/L 135 - 133(L)  Potassium 3.5 - 5.1 mmol/L 4.2 4.4 4.1  Chloride 98 - 111 mmol/L 100 - 100  CO2 22 - 32 mmol/L 22 - 22  Calcium 8.9 - 10.3 mg/dL 9.1 - 8.8(L)  Assessment/Plan:   1.Acute kidney injury, oliguric likely ATN due to hemorrhagic shock: Recurrent AKI and shock in between anuric and oliguric.  Nearly no urine output over the past 24 hours likely related to worsening hypotension and anemia -Continue CRRT, paused right now due to planned CT scan  -Keep net even after restarting CV and uptitrate UF as needed -Has failed transition to hemodialysis in the future.  We will continue to try as able  2.Hyperkalemia due to AKI and intra-abdominal bleeding/PRBC transfusion.  Has resolved on CRRT  3.Hypophosphatemia: hyperphosphatemic on 11/24 after stopping CRRT. Now acceptable.  Trend per CRRT protocol and replace as needed   4.shock:  Multifactorial with hemorrhage contributing.  S/p PRBC's.  Likely blood loss today.  Undergoing CT scan Maintain MAP >65  5.Perforated duodenal ulcer status post ex lap: per general surgery. Feeds per primary  6.ARDS/recent Covid pneumonia: mechanical ventilation per primary team   7. Anemia 2/2 AKI and hemorrhagic shock - feraheme 510 mg IV once on 11/23.  First got aranesp 40 mcg on 11/23 and will increase to 60 mcg for next dose. Ordered every Tuesday.  Acute anemia likely related to blood loss undergoing work-up right now  8.  Covid: Reason for admission now no longer requiring precautions.  Candace Chew, MD 09/24/2020  10:09 AM

## 2020-09-24 NOTE — Progress Notes (Signed)
NAMECorissa [name], MRN:  [id number], DOB:  01-03-1951, LOS: 23 ADMISSION DATE:  08/31/2020, CONSULTATION DATE:  09/07/2020 REFERRING MD:  Dr Candiss Norse, CHIEF COMPLAINT:  Acute resp failure  Brief History   69 year old female who was previously diagnosed with Covid 08/23/2020.  Admitted 11/2 with AF-RVR, found to have a perforated duodenal underwent exploratory laparotomy with Phillip Heal patch placement.  Past Medical History  Covid pneumonia Atrial fibrillation CKD stage III Diabetes mellitus Hypertension Colon cancer Hyperlipidemia  Significant Hospital Events   11/2 Admitted  11/3 OR -> perforated duodenal ulcer 11/10 progressive hemorrhagic shock, intubated, transfused, pressors, proned; started on CRRT in PM 11/16 Extubated. Re-intubated overnight due to respiratory distress and hypoxia with decreased mentation 11/18  bronch'd/ cultures sent 11/19 hgb down getting blood 09/22/2020 spiked fever resume empirical antimicrobial therapy 11/26: hemorrhagic shock, hgb 5.6, increased pressors, CT A/P   Consults:  Cardiology [facility] Nephrology   Procedures:  11/3 Exploratory laparotomy, Phillip Heal patch, lysis of adhesion for duodenal ulceration postop day 6  R PICC 11/5 >> A line 11/9 >> out ETT 11/9 > 11/16, 11/16 >> 09/21/2020 09/21/2020 tracheostomy>> Lt [place] CVL 11/9 >> R IJ trialysis >> out  Significant Diagnostic Tests:  11/3 CT abd/ pelvis > 1. Positive for bowel perforation: Pneumoperitoneum and intermediate density free fluid in the abdomen. Prior total colectomy. The specific site of perforation is unclear-oral contrast present to the proximal jejunum has not obviously leaked. Note that there may be small bowel loops adherent to the ventral abdominal wall along the greater curve of the stomach. 2. Extensive bilateral lower lung pneumonia. No pleural effusion. 3. Other abdominal and pelvic viscera are stable since 2015, including bilateral adrenal adenomas. Chronic renal  parapelvic cysts. 4. Aortic Atherosclerosis 11/3 TTE > EF 70-75%, RV not well visualized, mildly reduced RV systolic function 87/68 CT chest/ abd/ pelvis> 1. Interval progression of diffuse bilateral hazy ground-glass airspace opacities with more focal areas of consolidation at the lung bases 2. Trace bilateral pleural effusions. 3. Postsurgical changes the abdomen as detailed above. No evidence for a postoperative abscess, however evaluation is limited by lack of IV contrast. 4. There is a 1.9 cm cystic appearing lesion located in the pancreatic body. This was not present on the patient's CT from 2015.  Follow-up with an outpatient contrast enhanced MRI is recommended. 5. The endometrial stripe appears diffusely thickened. Follow-up with pelvic ultrasound is recommended. Aortic Atherosclerosis 11/14 LE doppler studies > + DVT of right posterior tibial and peroneal vein, +dVT of left posterior tibial vein  11/26 CT ABD PEL > pending   Micro Data:  11/10 MRSA PCR > neg 11/10 BC x 2 > neg 09/22/2020 blood cultures x2>> 09/22/2020 sputum culture>> +Staph aureus   Antimicrobials:  azithro 11/2 >11/3 Ceftriaxone 11/2  Fluconazole 11/3 Zosyn 11/3 >> 11/7 Vanc 11/10off Cefepime 11/10 > 11/16  09/22/2020 vancomycin for fever 102 elevated white count>> 11/26 09/22/2020 cefepime for fever 102 elevated white count>> 11/26  11/26 Cefazolin   Subjective:   Hypotensive overnight. Hgb 5.6 this AM.   Objective   Blood pressure (!) 105/44, pulse (!) 114, temperature 98.8 F (37.1 C), temperature source Oral, resp. rate (!) 22, height 5\' 8"  (1.727 m), weight 85.4 kg, SpO2 100 %. CVP:  [0 mmHg-5 mmHg] 3 mmHg  Vent Mode: PRVC FiO2 (%):  [60 %-80 %] 80 % Set Rate:  [26 bmp] 26 bmp Vt Set:  [510 mL] 510 mL PEEP:  [5 cmH20] 5 cmH20 Plateau Pressure:  [25  cmH20-27 cmH20] 27 cmH20   Intake/Output Summary (Last 24 hours) at 09/24/2020 0759 Last data filed at 09/24/2020 0700 Gross per 24 hour   Intake 3469.44 ml  Output 2048 ml  Net 1421.44 ml   Filed Weights   09/20/20 0418 09/21/20 0900 09/24/20 0500  Weight: 85.1 kg 87.4 kg 85.4 kg    Physical Exam: General: critically ill, trach in place, on vent  HEENT: trach in place, no secretions or bleeding  Neuro: sedated, following commands  CV: RRR, s1 s2 PULM: BL vented breaths  GI: illeostomy  GU: foley  Extremities: dependent edema  Skin: no rash   Assessment & Plan:   Hemorrhagic shock  Acute blood loss anemia  New Drop in hgb 5.6 on 11/26 Plan  Type & Screen 2 U pRBCS  STAT CT Abd Pelvis to r/o RP bleeding  Continue PPI BID  Continue pressor  Acute hypoxic/hypercapnic respiratory failure due to ARDS from COVID-19 pneumonia c/b HCAP and failure to wean and failed extubation 11/16 Plan Continue prolonged mechanical vent  Trach in place  Continue trach care  Weaning PEEP and Fio2  Likely need long term vent wean with HD support   Multifactorial shock, previously hemorrhagic, concerned for sepsis vs sedation related hypotension  Now with Staph Aureus PNA  Plan Awaiting susceptibilities  De-escalate to cefazolin + vancomycin   Anuric AKI on CRRT Plan CVVHD per nephrology   Duodenal ulcer perforation status post ex lap and Graham's patch Plan Post-op care per [facility]   Paroxysmal A. fib, currently in sinus rhythm Plan tele  Thrombocytopenia Plan Stable   Glucose control Plan lantus + SSI   1.9 cm cystic appearing lesion located in the pancreatic body Plan OP f/u   History of stage IV colon cancer Status post [facility] + Chemo OP f/u   Indeterminate bilateral DVT Patient had bilateral lower extremity Dopplers done, suggestive of possible indeterminate DVT, spoke with IR for IVC filter placement, after reevaluating ultrasound of lower extremities it was suggested patient does not have femoral/popliteal DVTs, so IVC filter was not placed Plan: No AC at this time    Best practice:  Diet: Tube feeds Pain/Anxiety/Delirium protocol (if indicated) midazolam fentanyl VAP protocol (if indicated): yes DVT prophylaxis: SCDs GI prophylaxis: Protonix Glucose control: lantus + ssi  Mobility: Bedrest Code Status: Full Family Communication: updated at bedside yesterday  Disposition: ICU  This patient is critically ill with multiple organ system failure; which, requires frequent high complexity decision making, assessment, support, evaluation, and titration of therapies. This was completed through the application of advanced monitoring technologies and extensive interpretation of multiple databases. During this encounter critical care time was devoted to patient care services described in this note for 33 minutes.  Garner Nash, DO [facility] 09/24/2020 7:59 AM

## 2020-09-24 NOTE — Progress Notes (Signed)
[place] notified of hgb 5.6. awaiting orders

## 2020-09-24 NOTE — Progress Notes (Signed)
eLink Physician-Brief Progress Note Patient Name: Candace [name] DOB: 03-08-1951 MRN: [id number]   Date of Service  09/24/2020  HPI/Events of Note  Hemoglobin 5.6 gm / dl (partially dilutional from iv albumin bolus).  eICU Interventions  Transfuse one unit PRBC.        Kerry Kass Elizah Lydon 09/24/2020, 6:55 AM

## 2020-09-25 ENCOUNTER — Inpatient Hospital Stay (HOSPITAL_COMMUNITY): Payer: Medicare Other

## 2020-09-25 DIAGNOSIS — U071 COVID-19: Secondary | ICD-10-CM | POA: Diagnosis not present

## 2020-09-25 DIAGNOSIS — Z9911 Dependence on respirator [ventilator] status: Secondary | ICD-10-CM | POA: Diagnosis not present

## 2020-09-25 DIAGNOSIS — I4891 Unspecified atrial fibrillation: Secondary | ICD-10-CM | POA: Diagnosis not present

## 2020-09-25 LAB — BLOOD CULTURE ID PANEL (REFLEXED) - BCID2

## 2020-09-25 LAB — GLUCOSE, CAPILLARY
Glucose-Capillary: 100 mg/dL — ABNORMAL HIGH (ref 70–99)
Glucose-Capillary: 103 mg/dL — ABNORMAL HIGH (ref 70–99)
Glucose-Capillary: 107 mg/dL — ABNORMAL HIGH (ref 70–99)
Glucose-Capillary: 129 mg/dL — ABNORMAL HIGH (ref 70–99)
Glucose-Capillary: 143 mg/dL — ABNORMAL HIGH (ref 70–99)
Glucose-Capillary: 166 mg/dL — ABNORMAL HIGH (ref 70–99)
Glucose-Capillary: 168 mg/dL — ABNORMAL HIGH (ref 70–99)
Glucose-Capillary: 82 mg/dL (ref 70–99)

## 2020-09-25 LAB — RENAL FUNCTION PANEL
Albumin: 1.7 g/dL — ABNORMAL LOW (ref 3.5–5.0)
Albumin: 1.5 g/dL — ABNORMAL LOW (ref 3.5–5.0)
Anion gap: 8 (ref 5–15)
Anion gap: 10 (ref 5–15)
BUN: 30 mg/dL — ABNORMAL HIGH (ref 8–23)
BUN: 31 mg/dL — ABNORMAL HIGH (ref 8–23)
CO2: 24 mmol/L (ref 22–32)
CO2: 23 mmol/L (ref 22–32)
Calcium: 9 mg/dL (ref 8.9–10.3)
Calcium: 8.1 mg/dL — ABNORMAL LOW (ref 8.9–10.3)
Chloride: 102 mmol/L (ref 98–111)
Chloride: 102 mmol/L (ref 98–111)
Creatinine, Ser: 0.53 mg/dL (ref 0.44–1.00)
Creatinine, Ser: 0.57 mg/dL (ref 0.44–1.00)
GFR, Estimated: >60 mL/min (ref >60)
GFR, Estimated: >60 mL/min (ref >60)
Glucose, Bld: 106 mg/dL — ABNORMAL HIGH (ref 70–99)
Glucose, Bld: 171 mg/dL — ABNORMAL HIGH (ref 70–99)
Phosphorus: 2.4 mg/dL — ABNORMAL LOW (ref 2.5–4.6)
Phosphorus: 2.9 mg/dL (ref 2.5–4.6)
Potassium: 3.7 mmol/L (ref 3.5–5.1)
Potassium: 4.3 mmol/L (ref 3.5–5.1)
Sodium: 134 mmol/L — ABNORMAL LOW (ref 135–145)
Sodium: 135 mmol/L (ref 135–145)

## 2020-09-25 LAB — TYPE AND SCREEN
ABO/RH(D): B POS
Antibody Screen: NEGATIVE
Unit division: 0
Unit division: 0

## 2020-09-25 LAB — CBC
HCT: 30.1 % — ABNORMAL LOW (ref 36.0–46.0)
HCT: 30.7 % — ABNORMAL LOW (ref 36.0–46.0)
Hemoglobin: 9.7 g/dL — ABNORMAL LOW (ref 12.0–15.0)
Hemoglobin: 9.6 g/dL — ABNORMAL LOW (ref 12.0–15.0)
MCH: 28.5 pg (ref 26.0–34.0)
MCH: 27.8 pg (ref 26.0–34.0)
MCHC: 32.2 g/dL (ref 30.0–36.0)
MCHC: 31.3 g/dL (ref 30.0–36.0)
MCV: 88.5 fL (ref 80.0–100.0)
MCV: 89 fL (ref 80.0–100.0)
Platelets: 89 10*3/uL — ABNORMAL LOW (ref 150–400)
Platelets: 91 10*3/uL — ABNORMAL LOW (ref 150–400)
RBC: 3.4 MIL/uL — ABNORMAL LOW (ref 3.87–5.11)
RBC: 3.45 MIL/uL — ABNORMAL LOW (ref 3.87–5.11)
RDW: 18.9 % — ABNORMAL HIGH (ref 11.5–15.5)
RDW: 19.7 % — ABNORMAL HIGH (ref 11.5–15.5)
WBC: 11.1 10*3/uL — ABNORMAL HIGH (ref 4.0–10.5)
WBC: 10.4 10*3/uL (ref 4.0–10.5)
nRBC: 3 % — ABNORMAL HIGH (ref 0.0–0.2)
nRBC: 4.8 % — ABNORMAL HIGH (ref 0.0–0.2)

## 2020-09-25 LAB — BPAM RBC
Blood Product Expiration Date: 202112162359
Blood Product Expiration Date: 202112162359
ISSUE DATE / TIME: 202111260932
ISSUE DATE / TIME: 202111261631
Unit Type and Rh: 7300
Unit Type and Rh: 7300

## 2020-09-25 LAB — VANCOMYCIN, TROUGH: Vancomycin Tr: 17 ug/mL (ref 15–20)

## 2020-09-25 LAB — MAGNESIUM: Magnesium: 2.3 mg/dL (ref 1.7–2.4)

## 2020-09-25 LAB — LACTIC ACID, PLASMA: Lactic Acid, Venous: 2.2 mmol/L (ref 0.5–1.9)

## 2020-09-25 MED ORDER — INSULIN DETEMIR 100 UNIT/ML ~~LOC~~ SOLN
20.0000 [IU] | Freq: Two times a day (BID) | SUBCUTANEOUS | Status: DC
  Administered 2020-09-25 – 2020-09-26 (×2): 20 [IU] via SUBCUTANEOUS
  Filled 2020-09-25 (×4): qty 0.2

## 2020-09-25 MED ORDER — POTASSIUM PHOSPHATES 15 MMOLE/5ML IV SOLN
20.0000 mmol | Freq: Once | INTRAVENOUS | Status: AC
  Administered 2020-09-25: 20 mmol via INTRAVENOUS
  Filled 2020-09-25: qty 6.67

## 2020-09-25 MED ORDER — ORAL CARE MOUTH RINSE
15.0000 mL | Freq: Four times a day (QID) | OROMUCOSAL | Status: DC
  Administered 2020-09-25 – 2020-10-28 (×132): 15 mL via OROMUCOSAL

## 2020-09-25 NOTE — Progress Notes (Signed)
RTnote- Patient continues to be dyssynchronous, xray ordered, fio2 increased 100%, sp02 had dropped to 65%. Sp02 now 100%.

## 2020-09-25 NOTE — Progress Notes (Signed)
[facility] Progress Note  Name: Candace [name] MRN: [id number] DOB: 1951-08-30  Subjective:  Patient stable over the past 24 hours.  Pressor requirement decreased slightly.  CT scan without obvious source of bleeding.  Hemoglobin improved.  Review of systems:  Unable to obtain 2/2 vent, sedation   Intake/Output Summary (Last 24 hours) at 09/25/2020 0958 Last data filed at 09/25/2020 0900 Gross per 24 hour  Intake 3578.25 ml  Output 3779 ml  Net -200.75 ml    Vitals:  Vitals:   09/25/20 0830 09/25/20 0845 09/25/20 0900 09/25/20 0915  BP: 122/68 118/81 115/70 99/62  Pulse: 90 89 90 88  Resp: 19 (!) 27 (!) 22 (!) 26  Temp:      TempSrc:      SpO2: 95% 93% 98% 94%  Weight:      Height:         Physical Exam:    GEN: Ill-appearing, lying in bed, no distress ENT: no nasal discharge, mmm EYES: no scleral icterus, does not move eyes to command CV: normal rate, no audible murmur PULM: Coarse bilateral breath sounds, ventilated, bilateral chest rise ABD: NABS, non-distended SKIN: Trace pitting edema in the bilateral lower extremities, warm and well perfused Access: Left IJ nontunneled catheter   Scheduled Meds: . sodium chloride   Intravenous Once  . B-complex with vitamin C  1 tablet Per Tube Daily  . chlorhexidine gluconate (MEDLINE KIT)  15 mL Mouth Rinse BID  . Chlorhexidine Gluconate Cloth  6 each Topical Daily  . chlorpheniramine-HYDROcodone  5 mL Per Tube Q12H  . [START ON 09/28/2020] darbepoetin (ARANESP) injection - DIALYSIS  60 mcg Subcutaneous Q Tue-1800  . feeding supplement (PROSource TF)  45 mL Per Tube BID  . insulin aspart  0-20 Units Subcutaneous Q4H  . insulin aspart  8 Units Subcutaneous Q4H  . insulin detemir  20 Units Subcutaneous BID  . mouth rinse  15 mL Mouth Rinse QID  . oxyCODONE  10 mg Per Tube Q6H  . pantoprazole sodium  40 mg Per Tube BID  . sodium chloride flush  10-40 mL Intracatheter Q12H   Continuous Infusions: .   prismasol BGK 4/2.5 400 mL/hr at 09/25/20 0500  .  prismasol BGK 4/2.5 300 mL/hr at 09/24/20 1912  . sodium chloride 10 mL/hr at 09/25/20 0900  . feeding supplement (NEPRO CARB STEADY) 1,000 mL (09/25/20 0529)  . fentaNYL infusion INTRAVENOUS 75 mcg/hr (09/25/20 0900)  . norepinephrine (LEVOPHED) Adult infusion 4 mcg/min (09/25/20 0900)  . potassium PHOSPHATE IVPB (in mmol) 85 mL/hr at 09/25/20 0900  . prismasol BGK 4/2.5 1,500 mL/hr at 09/25/20 0911  . vancomycin Stopped (09/24/20 1524)  . vasopressin Stopped (09/24/20 0945)   PRN Meds:.sodium chloride, acetaminophen (TYLENOL) oral liquid 160 mg/5 mL, albuterol, bacitracin, diphenhydrAMINE, fentaNYL, heparin, midazolam, phenol, silver nitrate applicators, sodium chloride, sodium chloride flush   Labs:  BMP Latest Ref Rng & Units 09/25/2020 09/24/2020 09/24/2020  Glucose 70 - 99 mg/dL 106(H) 122(H) 235(H)  BUN 8 - 23 mg/dL 30(H) 37(H) 38(H)  Creatinine 0.44 - 1.00 mg/dL 0.53 0.63 0.70  Sodium 135 - 145 mmol/L 134(L) 133(L) 135  Potassium 3.5 - 5.1 mmol/L 3.7 3.7 4.2  Chloride 98 - 111 mmol/L 102 99 100  CO2 22 - 32 mmol/L 24 25 22   Calcium 8.9 - 10.3 mg/dL 9.0 9.2 9.1     Assessment/Plan:   1.Acute kidney injury, oliguric likely ATN due to hemorrhagic shock: Recurrent AKI and shock in between anuric and  oliguric.  Nearly no urine output over the past 24 hours likely related to worsening hypotension and anemia -Continue CRRT, tolerating well today, patient examined while receiving CRRT today. -Increasing ultrafiltration rate up to 50 today, titrate as needed -Has failed transition to hemodialysis in the future.  We will continue to try as able  3.Hypophosphatemia: Intermittent issue, repleting today  4.shock: Multifactorial with hemorrhage contributing.  S/p PRBC's.  Continues on norepinephrine with slight improvement today  5.Perforated duodenal ulcer status post ex lap: per general surgery. Feeds per  primary  6.ARDS/recent Covid pneumonia: mechanical ventilation per primary team   7. Anemia 2/2 AKI and hemorrhagic shock - feraheme 510 mg IV once on 11/23.  First got aranesp 40 mcg on 11/23 and will increase to 60 mcg for next dose. Ordered every Tuesday.  Acute anemia yesterday without obvious source of blood loss.  Hemoglobin stabilized today  8.  Covid: Reason for admission now no longer requiring precautions.  Reesa Chew, MD 09/25/2020  9:58 AM

## 2020-09-25 NOTE — Progress Notes (Signed)
NAMELeilanee [name], MRN:  [id number], DOB:  1951/08/09, LOS: 24 ADMISSION DATE:  08/31/2020, CONSULTATION DATE:  09/07/2020 REFERRING MD:  Dr Candiss Norse, CHIEF COMPLAINT:  Acute resp failure  Brief History   69 year old female who was previously diagnosed with Covid 08/23/2020.  Admitted 11/2 with AF-RVR, found to have a perforated duodenal underwent exploratory laparotomy with Phillip Heal patch placement.  Past Medical History  Covid pneumonia Atrial fibrillation CKD stage III Diabetes mellitus Hypertension Colon cancer Hyperlipidemia  Significant Hospital Events   11/2 Admitted  11/3 OR -> perforated duodenal ulcer 11/10 progressive hemorrhagic shock, intubated, transfused, pressors, proned; started on CRRT in PM 11/16 Extubated. Re-intubated overnight due to respiratory distress and hypoxia with decreased mentation 11/18  bronch'd/ cultures sent 11/19 hgb down getting blood 09/22/2020 spiked fever resume empirical antimicrobial therapy 11/26: hemorrhagic shock, hgb 5.6, increased pressors, CT A/P   Consults:  Cardiology [facility] Nephrology   Procedures:  11/3 Exploratory laparotomy, Phillip Heal patch, lysis of adhesion for duodenal ulceration postop day 6  R PICC 11/5 >> A line 11/9 >> out ETT 11/9 > 11/16, 11/16 >> 09/21/2020 09/21/2020 tracheostomy>> Lt [place] CVL 11/9 >> R IJ trialysis >> out  Significant Diagnostic Tests:  11/3 CT abd/ pelvis > 1. Positive for bowel perforation: Pneumoperitoneum and intermediate density free fluid in the abdomen. Prior total colectomy. The specific site of perforation is unclear-oral contrast present to the proximal jejunum has not obviously leaked. Note that there may be small bowel loops adherent to the ventral abdominal wall along the greater curve of the stomach. 2. Extensive bilateral lower lung pneumonia. No pleural effusion. 3. Other abdominal and pelvic viscera are stable since 2015, including bilateral adrenal adenomas. Chronic renal  parapelvic cysts. 4. Aortic Atherosclerosis 11/3 TTE > EF 70-75%, RV not well visualized, mildly reduced RV systolic function 68/12 CT chest/ abd/ pelvis> 1. Interval progression of diffuse bilateral hazy ground-glass airspace opacities with more focal areas of consolidation at the lung bases 2. Trace bilateral pleural effusions. 3. Postsurgical changes the abdomen as detailed above. No evidence for a postoperative abscess, however evaluation is limited by lack of IV contrast. 4. There is a 1.9 cm cystic appearing lesion located in the pancreatic body. This was not present on the patient's CT from 2015.  Follow-up with an outpatient contrast enhanced MRI is recommended. 5. The endometrial stripe appears diffusely thickened. Follow-up with pelvic ultrasound is recommended. Aortic Atherosclerosis 11/14 LE doppler studies > + DVT of right posterior tibial and peroneal vein, +dVT of left posterior tibial vein  11/26 CT ABD PEL > pending   Micro Data:  11/10 MRSA PCR > neg 11/10 BC x 2 > neg 09/22/2020 blood cultures x2>> 09/22/2020 sputum culture>> +Staph aureus   Antimicrobials:  azithro 11/2 >11/3 Ceftriaxone 11/2  Fluconazole 11/3 Zosyn 11/3 >> 11/7 Vanc 11/10off Cefepime 11/10 > 11/16  09/22/2020 vancomycin for fever 102 elevated white count>> 11/26 09/22/2020 cefepime for fever 102 elevated white count>> 11/26  11/26 Cefazolin   Subjective:   Remains critically ill. On NEPI infusion and cvvhd   Objective   Blood pressure (!) 105/58, pulse 85, temperature (!) 96.1 F (35.6 C), temperature source Oral, resp. rate (!) 26, height 5\' 8"  (1.727 m), weight 86 kg, SpO2 99 %.    Vent Mode: PRVC FiO2 (%):  [60 %-70 %] 60 % Set Rate:  [26 bmp] 26 bmp Vt Set:  [510 mL] 510 mL PEEP:  [5 cmH20] 5 cmH20 Plateau Pressure:  [20 cmH20-31 cmH20] 31  cmH20   Intake/Output Summary (Last 24 hours) at 09/25/2020 0731 Last data filed at 09/25/2020 0700 Gross per 24 hour  Intake 4473.85 ml   Output 3559 ml  Net 914.85 ml   Filed Weights   09/21/20 0900 09/24/20 0500 09/25/20 0600  Weight: 87.4 kg 85.4 kg 86 kg    Physical Exam: General: critically ill, intubated on life support, on vent, cvvhd, and pressors  HEENT: trach in place, cdi  Neuro: alert following commands  CV: RRR, s1 s2 PULM: BL vented breaths  GI: illeostomy GU: foley in place  Extremities: BL dependent edema  Skin: no rash   Assessment & Plan:   Hemorrhagic shock  Acute blood loss anemia  New Drop in hgb 5.6 on 11/26 Plan  CT reviewed no RP bleed No more blood tinged out from illeostomy  Presumed from GI source  Continue to observe   Acute hypoxic/hypercapnic respiratory failure due to ARDS from COVID-19 pneumonia c/b HCAP and failure to wean and failed extubation 11/16 Plan Continue prolonged mechanical support  Slow weaning plan in process  Likely need LTACH   Multifactorial shock, previously hemorrhagic, Now Septic shock, MRSA pneumonia 1/4 bottles BCX with Meth R Staph Epi, likely contaminate  Plan Continue vancomycin   Anuric AKI on CRRT Plan CVVHD per nephrology   Duodenal ulcer perforation status post ex lap and Graham's patch Plan Post-op care per ccs   Paroxysmal A. fib, currently in sinus rhythm Plan Tele continued  Thrombocytopenia Plan Stable   Glucose control Plan lantus + SSI   1.9 cm cystic appearing lesion located in the pancreatic body Plan OP f.u   History of stage IV colon cancer Status post pulmonary metastatectomies at [facility] + Chemo OP f/u   Indeterminate bilateral DVT Patient had bilateral lower extremity Dopplers done, suggestive of possible indeterminate DVT, spoke with IR for IVC filter placement, after reevaluating ultrasound of lower extremities it was suggested patient does not have femoral/popliteal DVTs, so IVC filter was not placed Plan: Holding AC due to bleeding   Best practice:  Diet: Tube feeds Pain/Anxiety/Delirium protocol (if  indicated) midazolam fentanyl VAP protocol (if indicated): yes DVT prophylaxis: SCDs GI prophylaxis: Protonix Glucose control: lantus + ssi  Mobility: Bedrest Code Status: Full Family Communication: updated at bedside yesterday  Disposition: ICU  This patient is critically ill with multiple organ system failure; which, requires frequent high complexity decision making, assessment, support, evaluation, and titration of therapies. This was completed through the application of advanced monitoring technologies and extensive interpretation of multiple databases. During this encounter critical care time was devoted to patient care services described in this note for 32 minutes.  Garner Nash, DO [facility] 09/25/2020 7:32 AM

## 2020-09-25 NOTE — Progress Notes (Signed)
PHARMACY - PHYSICIAN COMMUNICATION CRITICAL VALUE ALERT - BLOOD CULTURE IDENTIFICATION (BCID)  Candace [name] is an 69 y.o. female who presented to [facility] on 08/31/2020 with a chief complaint of COVID  Assessment:  Patient on Vancomycin for MRSA pneumonia. Blood cultures with 1 out of 2 sets growing GPC clusters. BCID with methicillin resistant staph epidermidis. WBC trending down. Low temperatures on CRRT.   Name of physician (or Provider) Contacted: Dr Valeta Harms  Current antibiotics: Vancomycin  Changes to prescribed antibiotics recommended:  No change at this time.  Discussed with MD - continue Vancomycin for now.  Monitor finalized culture results.   Results for orders placed or performed during the hospital encounter of 08/31/20  Blood Culture ID Panel (Reflexed) (Collected: 09/22/2020 10:45 AM)  Result Value Ref Range   Enterococcus faecalis NOT DETECTED NOT DETECTED   Enterococcus Faecium NOT DETECTED NOT DETECTED   Listeria monocytogenes NOT DETECTED NOT DETECTED   Staphylococcus species DETECTED (A) NOT DETECTED   Staphylococcus aureus (BCID) NOT DETECTED NOT DETECTED   Staphylococcus epidermidis DETECTED (A) NOT DETECTED   Staphylococcus lugdunensis NOT DETECTED NOT DETECTED   Streptococcus species NOT DETECTED NOT DETECTED   Streptococcus agalactiae NOT DETECTED NOT DETECTED   Streptococcus pneumoniae NOT DETECTED NOT DETECTED   Streptococcus pyogenes NOT DETECTED NOT DETECTED   A.calcoaceticus-baumannii NOT DETECTED NOT DETECTED   Bacteroides fragilis NOT DETECTED NOT DETECTED   Enterobacterales NOT DETECTED NOT DETECTED   Enterobacter cloacae complex NOT DETECTED NOT DETECTED   Escherichia coli NOT DETECTED NOT DETECTED   Klebsiella aerogenes NOT DETECTED NOT DETECTED   Klebsiella oxytoca NOT DETECTED NOT DETECTED   Klebsiella pneumoniae NOT DETECTED NOT DETECTED   Proteus species NOT DETECTED NOT DETECTED   Salmonella species NOT DETECTED NOT DETECTED   Serratia  marcescens NOT DETECTED NOT DETECTED   Haemophilus influenzae NOT DETECTED NOT DETECTED   Neisseria meningitidis NOT DETECTED NOT DETECTED   Pseudomonas aeruginosa NOT DETECTED NOT DETECTED   Stenotrophomonas maltophilia NOT DETECTED NOT DETECTED   Candida albicans NOT DETECTED NOT DETECTED   Candida auris NOT DETECTED NOT DETECTED   Candida glabrata NOT DETECTED NOT DETECTED   Candida krusei NOT DETECTED NOT DETECTED   Candida parapsilosis NOT DETECTED NOT DETECTED   Candida tropicalis NOT DETECTED NOT DETECTED   Cryptococcus neoformans/gattii NOT DETECTED NOT DETECTED   Methicillin resistance mecA/C DETECTED (A) NOT DETECTED    Brain Hilts 09/25/2020  9:13 AM

## 2020-09-25 NOTE — Progress Notes (Signed)
Pharmacy Antibiotic Note  Katriel Cutsforth is a 69 y.o. female admitted on 08/31/2020 with afib with RVR now with possible sepsis. WBC 16.5, Tmax 102.7, pressor requirement is increasing. Pharmacy has been consulted for vancomycin dosing.  Patient remains on CRRT - running well with no issues noted.  Vancomycin trough is therapeutic at 17.   Plan: Continue Vancomycin 1000 mg IV every 24h while on CRRT Continue to monitor CRRT and plan of care.   Height: _0  (172.7 cm) Weight: 86 kg (189 lb 9.5 oz) IBW/kg (Calculated) : 63.9  Temp (24hrs), Avg:97.1 F (36.2 C), Min:95.9 F (35.5 C), Max:97.6 F (36.4 C)  Recent Labs  Lab 09/21/20 0435 09/21/20 0435 09/22/20 0342 09/22/20 1600 09/23/20 0303 09/23/20 1622 09/24/20 0443 09/24/20 1603 09/25/20 0416 09/25/20 1327  WBC 6.6  --  16.5*  --  18.7*  --  13.2*  --  11.1*  --   CREATININE 0.38*   < > 1.31*   < > 0.62 0.66 0.70 0.63 0.53  --   VANCOTROUGH  --   --   --   --   --   --   --   --   --  17   < > = values in this interval not displayed.    Estimated Creatinine Clearance: 76.2 mL/min (by C-G formula based on SCr of 0.53 mg/dL).    No Known Allergies  Antimicrobials this admission: LVQ 10/31 x1, Azith 11/2>>11/3, CTX 11/2 x1, Flucon 11/3 x1 Zosyn 11/3>11/7 Vanc 11/10 x1; 11/24> Cefepime 11/10 >>11/16; 11/24>11/26  Microbiology results: 11/10 MRSA PCR: neg 11/10 Bcx: neg 11/18 BAL: few candida 11/24 TA: MRSA 11/24 Bcx: 1 out of 2 with GPC clusters (BCID MRSE)  Thank you for allowing pharmacy to be a part of this patient's care.  Sloan Leiter, PharmD, BCPS, BCCCP Clinical Pharmacist Please refer to [facility] for [facility] numbers 09/25/2020 2:50 PM  Please check [email or web address] for unit specific pharmacy phone numbers.

## 2020-09-25 NOTE — Progress Notes (Signed)
Dr. Valeta Harms notified of patient desynchrony, tachypnea, accessory muscle use, and SPO2 drop to 65%. PRN Versed and Fentanyl given per order. Lactic acid, CBC, and STAT chest x-ray ordered.  09/25/20 6:53 PM Dr. Valeta Harms notified of critical Lactic Acid of 2.2.

## 2020-09-26 DIAGNOSIS — Z9911 Dependence on respirator [ventilator] status: Secondary | ICD-10-CM | POA: Diagnosis not present

## 2020-09-26 DIAGNOSIS — I4891 Unspecified atrial fibrillation: Secondary | ICD-10-CM | POA: Diagnosis not present

## 2020-09-26 DIAGNOSIS — J80 Acute respiratory distress syndrome: Secondary | ICD-10-CM | POA: Diagnosis not present

## 2020-09-26 DIAGNOSIS — U071 COVID-19: Secondary | ICD-10-CM | POA: Diagnosis not present

## 2020-09-26 LAB — POCT I-STAT 7, (LYTES, BLD GAS, ICA,H+H)
Acid-Base Excess: 1 mmol/L (ref 0.0–2.0)
Bicarbonate: 26 mmol/L (ref 20.0–28.0)
Calcium, Ion: 1.27 mmol/L (ref 1.15–1.40)
HCT: 29 % — ABNORMAL LOW (ref 36.0–46.0)
Hemoglobin: 9.9 g/dL — ABNORMAL LOW (ref 12.0–15.0)
O2 Saturation: 99 %
Potassium: 4.8 mmol/L (ref 3.5–5.1)
Sodium: 135 mmol/L (ref 135–145)
TCO2: 27 mmol/L (ref 22–32)
pCO2 arterial: 40.5 mmHg (ref 32.0–48.0)
pH, Arterial: 7.416 (ref 7.350–7.450)
pO2, Arterial: 145 mmHg — ABNORMAL HIGH (ref 83.0–108.0)

## 2020-09-26 LAB — RENAL FUNCTION PANEL
Albumin: 1.5 g/dL — ABNORMAL LOW (ref 3.5–5.0)
Albumin: 1.4 g/dL — ABNORMAL LOW (ref 3.5–5.0)
Anion gap: 8 (ref 5–15)
Anion gap: 9 (ref 5–15)
BUN: 25 mg/dL — ABNORMAL HIGH (ref 8–23)
BUN: 27 mg/dL — ABNORMAL HIGH (ref 8–23)
CO2: 26 mmol/L (ref 22–32)
CO2: 24 mmol/L (ref 22–32)
Calcium: 8.5 mg/dL — ABNORMAL LOW (ref 8.9–10.3)
Calcium: 8 mg/dL — ABNORMAL LOW (ref 8.9–10.3)
Chloride: 101 mmol/L (ref 98–111)
Chloride: 101 mmol/L (ref 98–111)
Creatinine, Ser: 0.51 mg/dL (ref 0.44–1.00)
Creatinine, Ser: 0.48 mg/dL (ref 0.44–1.00)
GFR, Estimated: >60 mL/min (ref >60)
GFR, Estimated: >60 mL/min (ref >60)
Glucose, Bld: 145 mg/dL — ABNORMAL HIGH (ref 70–99)
Glucose, Bld: 159 mg/dL — ABNORMAL HIGH (ref 70–99)
Phosphorus: 2 mg/dL — ABNORMAL LOW (ref 2.5–4.6)
Phosphorus: 3.7 mg/dL (ref 2.5–4.6)
Potassium: 4 mmol/L (ref 3.5–5.1)
Potassium: 4.6 mmol/L (ref 3.5–5.1)
Sodium: 135 mmol/L (ref 135–145)
Sodium: 134 mmol/L — ABNORMAL LOW (ref 135–145)

## 2020-09-26 LAB — CULTURE, BLOOD (ROUTINE X 2)

## 2020-09-26 LAB — CBC
HCT: 30.9 % — ABNORMAL LOW (ref 36.0–46.0)
Hemoglobin: 9.6 g/dL — ABNORMAL LOW (ref 12.0–15.0)
MCH: 28.1 pg (ref 26.0–34.0)
MCHC: 31.1 g/dL (ref 30.0–36.0)
MCV: 90.4 fL (ref 80.0–100.0)
Platelets: 73 10*3/uL — ABNORMAL LOW (ref 150–400)
RBC: 3.42 MIL/uL — ABNORMAL LOW (ref 3.87–5.11)
RDW: 20.1 % — ABNORMAL HIGH (ref 11.5–15.5)
WBC: 12.3 10*3/uL — ABNORMAL HIGH (ref 4.0–10.5)
nRBC: 3.3 % — ABNORMAL HIGH (ref 0.0–0.2)

## 2020-09-26 LAB — GLUCOSE, CAPILLARY
Glucose-Capillary: 120 mg/dL — ABNORMAL HIGH (ref 70–99)
Glucose-Capillary: 132 mg/dL — ABNORMAL HIGH (ref 70–99)
Glucose-Capillary: 137 mg/dL — ABNORMAL HIGH (ref 70–99)
Glucose-Capillary: 138 mg/dL — ABNORMAL HIGH (ref 70–99)
Glucose-Capillary: 141 mg/dL — ABNORMAL HIGH (ref 70–99)
Glucose-Capillary: 96 mg/dL (ref 70–99)

## 2020-09-26 LAB — MAGNESIUM: Magnesium: 2.3 mg/dL (ref 1.7–2.4)

## 2020-09-26 MED ORDER — POTASSIUM PHOSPHATES 15 MMOLE/5ML IV SOLN
30.0000 mmol | Freq: Once | INTRAVENOUS | Status: AC
  Administered 2020-09-26: 30 mmol via INTRAVENOUS
  Filled 2020-09-26: qty 10

## 2020-09-26 MED ORDER — INSULIN DETEMIR 100 UNIT/ML ~~LOC~~ SOLN
20.0000 [IU] | Freq: Every day | SUBCUTANEOUS | Status: DC
  Administered 2020-09-27: 20 [IU] via SUBCUTANEOUS
  Filled 2020-09-26 (×2): qty 0.2

## 2020-09-26 MED ORDER — ALBUMIN HUMAN 5 % IV SOLN
25.0000 g | Freq: Once | INTRAVENOUS | Status: AC
  Administered 2020-09-26: 25 g via INTRAVENOUS
  Filled 2020-09-26: qty 500

## 2020-09-26 NOTE — Progress Notes (Signed)
[name] Progress Note   Patient Name: Candace [name]       Date: 09/26/2020 DOB: Aug 24, 1951  Age: 69 y.o. MRN#: [id number] Attending Physician: Garner Nash, DO Primary Care Physician: Algis Greenhouse, MD Admit Date: 08/31/2020  Reason for Consultation/Follow-up: To discuss complex medical decision making related to patient's goals of care  Subjective: Extended conversation with Candace [name] and one other daughter Candace Ables?).   Many questions answered to the best of my ability.  Attempted to explain that we are working hard to support this lovely lovely person and we want her to get better, be successful and go home.   However, she has had multiple devastating blows including severe COVID-19, multifocal pneumonia, duodenal perforation, kidney failure, hemorrhagic shock.  She is currently very very fragile.  We are concerned about her ability to ever leave the intensive care unit.  She is unable to leave the intensive care unit as long as she remains on CRRT.  She has failed transfer to intermittent hemodialysis.  Daughters asked many excellent questions.  What is causing her blood pressure to drop?  When will her wound heal?  Why is her wound open?  Daughters indicate that when their mother has severe pain (such as a wound packing change) for anxiety it causes their mother's blood pressure to drop.  They do not want her to suffer.  Candace [name] explained that the patient is an extremely generous caretaker.  She is staunchly against anyone living in a nursing facility.  So much so that the patient quit her job and moved to [place] from [place] in order to care for her father and avoid a nursing facility.  She has always preached to her daughters not to put her in a nursing facility but to care for her at  home.  Immediately after her duodenal patch surgery she talked to her daughters on the phone about getting a hospital bed in the house and making arrangements for her to come home and be cared for by family in order to avoid a nursing facility.  Fortunately the daughters explained that they have a very large family with a depth of medical expertise.  They are willing to care for her on a vent at home, do dialysis in the home, what ever it takes.  We talked about the fact that  she cannot leave the intensive care unit until she is off of pressor support and continuous renal replacement therapy.  She is unable to come off of either of these at this point.  Family started asking specific surgical questions.  I deferred to surgery.  They would be grateful for a call from surgery with an update.  PMT committed to touch base with the family intermittently to answer questions and offer support.  Family was appreciative of the phone call.   Assessment: 69 year old well loved matriarch of a very large family with the perfect storm of Covid with multifocal pneumonia, perforated ulcer, renal failure.  Family's fervent hope is to get her home and care for her there.  Patient is incredibly fragile and unfortunately I am concerned she may not leave the hospital.  Patient Profile/HPI:   69 y.o. female  with past medical history of HTN, HLD, DM, recent COVID-19 infection 08/23/20 admitted on 08/31/2020 with hypoxia and atrial fibrillation RVR. Admitted to medical floor and started on Cardizem infusion and Eliquis. Hospital course complicated by abdominal pain, found to have perforated duodenal ulcer s/p explaratory lap, lysis of adhesions, Graham patch placement. Developed bleeding from surgical site post op day 6. 11/10 ICU transfer due to hemorrhagic shock, intubated, transfused, pressors, proned, and started on CRRT. Extubated 11/16 and re-intubated that evening due to respiratory distress, hypoxia, and altered mental  status. Remains critically ill, intubated on mechanical ventilation. Ongoing CRRT with plans to attempt iHD per nephrology. Palliative medicine consultation for goals of care.    Length of Stay: 25   Vital Signs: BP (!) 100/57   Pulse 91   Temp 97.6 F (36.4 C) (Oral)   Resp (!) 23   Ht 5\' 8"  (1.727 m)   Wt 84.5 kg   SpO2 93%   BMI 28.33 kg/m  SpO2: SpO2: 93 % O2 Device: O2 Device: Ventilator O2 Flow Rate: O2 Flow Rate (L/min): 15 L/min       Palliative Assessment/Data:  10%     Palliative Care Plan    Recommendations/Plan: Continue current care. PMT will follow with you and touch base with the family every few days to help answer questions and offer support.  Code Status:  Full code  Prognosis: She is very fragile and at very high risk for acute decline or even death.  She is on an incredible amount of artificial support at this time.  If any of that support was removed she would not survive.  Discharge Planning: To Be Determined family wants to make arrangements to provide high level of care at home including ventilator support and dialysis if necessary.  Multiple nurses, nurse practitioners, etc.  Are in the family and available to help.  Care plan was discussed with CCM MD, ICU RN, family  Thank you for allowing the Palliative Medicine Team to assist in the care of this patient.  Total time spent: 75 minutes    Greater than 50%  of this time was spent counseling and coordinating care related to the above assessment and plan.  Florentina Jenny, PA-C Palliative Medicine  Please contact Palliative MedicineTeam phone at [phone] for questions and concerns between 7 am - 7 pm.   Please see AMION for individual provider pager numbers.

## 2020-09-26 NOTE — Progress Notes (Signed)
[facility] Progress Note  Name: Chayse Gracey MRN: [id number] DOB: 08-30-1951  Subjective:  Tolerating ultrafiltration and CRRT.  Pressor requirements fluctuating minimally.  No other acute issues.  Review of systems:  Unable to obtain 2/2 vent, sedation   Intake/Output Summary (Last 24 hours) at 09/26/2020 1012 Last data filed at 09/26/2020 1000 Gross per 24 hour  Intake 3417.78 ml  Output 4694 ml  Net -1276.22 ml    Vitals:  Vitals:   09/26/20 0915 09/26/20 0930 09/26/20 0945 09/26/20 1000  BP: (!) 97/58 (!) 100/53 107/64 (!) 100/58  Pulse: 100 96 99 94  Resp: (!) 28 (!) 23 (!) 24 (!) 27  Temp:      TempSrc:      SpO2: (!) 89% 95% 96% 95%  Weight:      Height:         Physical Exam:    GEN: Ill-appearing, lying in bed, no distress ENT: no nasal discharge, mmm EYES: no scleral icterus, does not move eyes to command CV: normal rate, no audible murmur PULM: Coarse bilateral breath sounds, ventilated, bilateral chest rise ABD: NABS, non-distended SKIN: Trace pitting edema in the bilateral lower extremities, warm and well perfused Access: Left IJ nontunneled catheter   Scheduled Meds: . sodium chloride   Intravenous Once  . B-complex with vitamin C  1 tablet Per Tube Daily  . chlorhexidine gluconate (MEDLINE KIT)  15 mL Mouth Rinse BID  . Chlorhexidine Gluconate Cloth  6 each Topical Daily  . chlorpheniramine-HYDROcodone  5 mL Per Tube Q12H  . [START ON 09/28/2020] darbepoetin (ARANESP) injection - DIALYSIS  60 mcg Subcutaneous Q Tue-1800  . feeding supplement (PROSource TF)  45 mL Per Tube BID  . insulin aspart  0-20 Units Subcutaneous Q4H  . insulin aspart  8 Units Subcutaneous Q4H  . insulin detemir  20 Units Subcutaneous BID  . mouth rinse  15 mL Mouth Rinse QID  . oxyCODONE  10 mg Per Tube Q6H  . pantoprazole sodium  40 mg Per Tube BID  . sodium chloride flush  10-40 mL Intracatheter Q12H   Continuous Infusions: .  prismasol BGK 4/2.5 400  mL/hr at 09/26/20 0744  .  prismasol BGK 4/2.5 300 mL/hr at 09/26/20 7846  . sodium chloride 10 mL/hr at 09/26/20 1000  . feeding supplement (NEPRO CARB STEADY) 65 mL/hr at 09/26/20 0800  . fentaNYL infusion INTRAVENOUS 150 mcg/hr (09/26/20 1000)  . norepinephrine (LEVOPHED) Adult infusion 10 mcg/min (09/26/20 1000)  . potassium PHOSPHATE IVPB (in mmol) 85 mL/hr at 09/26/20 1000  . prismasol BGK 4/2.5 1,500 mL/hr at 09/26/20 0653  . vancomycin Stopped (09/25/20 1553)  . vasopressin Stopped (09/24/20 0945)   PRN Meds:.sodium chloride, acetaminophen (TYLENOL) oral liquid 160 mg/5 mL, albuterol, bacitracin, diphenhydrAMINE, fentaNYL, heparin, midazolam, phenol, silver nitrate applicators, sodium chloride, sodium chloride flush   Labs:  BMP Latest Ref Rng & Units 09/26/2020 09/25/2020 09/25/2020  Glucose 70 - 99 mg/dL 145(H) 171(H) 106(H)  BUN 8 - 23 mg/dL 25(H) 31(H) 30(H)  Creatinine 0.44 - 1.00 mg/dL 0.51 0.57 0.53  Sodium 135 - 145 mmol/L 135 135 134(L)  Potassium 3.5 - 5.1 mmol/L 4.0 4.3 3.7  Chloride 98 - 111 mmol/L 101 102 102  CO2 22 - 32 mmol/L 26 23 24   Calcium 8.9 - 10.3 mg/dL 8.5(L) 8.1(L) 9.0     Assessment/Plan:   1.Acute kidney injury, oliguric likely ATN due to hemorrhagic shock: Recurrent AKI and shock in between anuric and oliguric.  Nearly  no urine output as of late -Continue CRRT, tolerating well today, patient examined while receiving CRRT today. -Continue with ultrafiltration 50 to 75 cc/h as tolerated.  May decrease if pressor requirement increases -Has failed transition to hemodialysis in the future.  We will continue to try as able.  Aggressive goals of care  3.Hypophosphatemia: Intermittent issue, repleting today  4.shock: Multifactorial with hemorrhage contributing.  S/p PRBC's.  Continues on norepinephrine fluctuating requirement  5.Perforated duodenal ulcer status post ex lap: per general surgery. Feeds per primary  6.ARDS/recent Covid pneumonia:  mechanical ventilation per primary team   7. Anemia 2/2 AKI and hemorrhagic shock - feraheme 510 mg IV once on 11/23.  First got aranesp 40 mcg on 11/23 and will increase to 60 mcg for next dose. Ordered every Tuesday.  Acute anemia yesterday without obvious source of blood loss.  Hemoglobin more stable at this time  8.  Covid: Reason for admission now no longer requiring precautions.  Reesa Chew, MD 09/26/2020  10:12 AM

## 2020-09-26 NOTE — Progress Notes (Signed)
[name], MRN:  [id number], DOB:  1951-03-09, LOS: 15 ADMISSION DATE:  08/31/2020, CONSULTATION DATE:  09/07/2020 REFERRING MD:  Dr Candace [name], CHIEF COMPLAINT:  Acute resp failure  Brief History   69 year old female who was previously diagnosed with Covid 08/23/2020.  Admitted 11/2 with AF-RVR, found to have a perforated duodenal underwent exploratory laparotomy with Candace [name] patch placement.  Past Medical History  Covid pneumonia Atrial fibrillation CKD stage III Diabetes mellitus Hypertension Colon cancer Hyperlipidemia  Significant Hospital Events   11/2 Admitted  11/3 OR -> perforated duodenal ulcer 11/10 progressive hemorrhagic shock, intubated, transfused, pressors, proned; started on CRRT in PM 11/16 Extubated. Re-intubated overnight due to respiratory distress and hypoxia with decreased mentation 11/18  bronch'd/ cultures sent 11/19 hgb down getting blood 09/22/2020 spiked fever resume empirical antimicrobial therapy 11/26: hemorrhagic shock, hgb 5.6, increased pressors, CT A/P   Consults:  Cardiology [facility] Nephrology   Procedures:  11/3 Exploratory laparotomy, Candace [name] patch, lysis of adhesion for duodenal ulceration postop day 6  R PICC 11/5 >> A line 11/9 >> out ETT 11/9 > 11/16, 11/16 >> 09/21/2020 09/21/2020 tracheostomy>> Lt [place] CVL 11/9 >> R IJ trialysis >> out  Significant Diagnostic Tests:  11/3 CT abd/ pelvis > 1. Positive for bowel perforation: Pneumoperitoneum and intermediate density free fluid in the abdomen. Prior total colectomy. The specific site of perforation is unclear-oral contrast present to the proximal jejunum has not obviously leaked. Note that there may be small bowel loops adherent to the ventral abdominal wall along the greater curve of the stomach. 2. Extensive bilateral lower lung pneumonia. No pleural effusion. 3. Other abdominal and pelvic viscera are stable since 2015, including bilateral adrenal adenomas. Chronic renal  parapelvic cysts. 4. Aortic Atherosclerosis 11/3 TTE > EF 70-75%, RV not well visualized, mildly reduced RV systolic function 40/98 CT chest/ abd/ pelvis> 1. Interval progression of diffuse bilateral hazy ground-glass airspace opacities with more focal areas of consolidation at the lung bases 2. Trace bilateral pleural effusions. 3. Postsurgical changes the abdomen as detailed above. No evidence for a postoperative abscess, however evaluation is limited by lack of IV contrast. 4. There is a 1.9 cm cystic appearing lesion located in the pancreatic body. This was not present on the patient's CT from 2015.  Follow-up with an outpatient contrast enhanced MRI is recommended. 5. The endometrial stripe appears diffusely thickened. Follow-up with pelvic ultrasound is recommended. Aortic Atherosclerosis 11/14 LE doppler studies > + DVT of right posterior tibial and peroneal vein, +dVT of left posterior tibial vein  11/26 CT ABD PEL > pending   Micro Data:  11/10 MRSA PCR > neg 11/10 BC x 2 > neg 09/22/2020 blood cultures x2>> 09/22/2020 sputum culture>> +Staph aureus   Antimicrobials:  azithro 11/2 >11/3 Ceftriaxone 11/2  Fluconazole 11/3 Zosyn 11/3 >> 11/7 Vanc 11/10off Cefepime 11/10 > 11/16  09/22/2020 vancomycin for fever 102 elevated white count>> 11/26 09/22/2020 cefepime for fever 102 elevated white count>> 11/26  11/26 Cefazolin   Subjective:   Remains critially ill, on vent. On cvvhd. More comfortable this morning. Alert following commands. Attempts communication   Objective   Blood pressure 118/65, pulse 97, temperature 97.6 F (36.4 C), temperature source Oral, resp. rate (!) 28, height 5\' 8"  (1.727 m), weight 84.5 kg, SpO2 100 %. CVP:  [3 mmHg] 3 mmHg  Vent Mode: PRVC FiO2 (%):  [50 %-100 %] 100 % Set Rate:  [26 bmp] 26 bmp Vt Set:  [510 mL] 510 mL PEEP:  [5  [place] Pressure:  [23 cmH20-28 cmH20] 23 cmH20   Intake/Output Summary (Last 24 hours) at  09/26/2020 9449 Last data filed at 09/26/2020 0700 Gross per 24 hour  Intake 3323.29 ml  Output 4380 ml  Net -1056.71 ml   Filed Weights   09/24/20 0500 09/25/20 0600 09/26/20 0430  Weight: 85.4 kg 86 kg 84.5 kg    Physical Exam: General: elderly fm, trach in place, following commands  HEENT: trach in place  Neuro: alert following commands CV: RRR, s1 s2  PULM: BL vented breaths GI: illeostomy, brown stool  GU: foley in place  Extremities: dependent edema, UE mainly  Skin: no rash   Assessment & Plan:   Hemorrhagic shock  Acute blood loss anemia  New Drop in hgb 5.6 on 11/26, now stable  Plan  No more signs of bleeding  Trend H&H  Sedation needs - she has been difficult to wean from continuous sedation  - she gets really worked up and de-saturates  - I suspect this will be a slow process, continue scheduled orals.   Acute hypoxic/hypercapnic respiratory failure due to ARDS from COVID-19 pneumonia c/b HCAP and failure to wean and failed extubation 11/16 Plan Likely needs prolonged mechanical support and vent wean This has been discussed with family   Multifactorial shock, previously hemorrhagic, Now Septic shock, MRSA pneumonia 1/4 bottles BCX with Meth R Staph Epi, likely contaminate  Plan Continue vancomycin   Anuric AKI on CRRT Plan CVVHD per nephrology   Duodenal ulcer perforation status post ex lap and Candace [name]'s patch Plan Post-op care per [facility]   Paroxysmal A. fib, currently in sinus rhythm Plan Tele Holding AC due to recent bleeding event again   Thrombocytopenia Plan Stable   Glucose control Plan lantus decreased yesterday  SSI   1.9 cm cystic appearing lesion located in the pancreatic body Plan OP f/u   History of stage IV colon cancer Status post pulmonary metastatectomies at [facility] + Chemo OP f/u   Indeterminate bilateral DVT Patient had bilateral lower extremity Dopplers done, suggestive of possible indeterminate DVT, spoke with IR  for IVC filter placement, after reevaluating ultrasound of lower extremities it was suggested patient does not have femoral/popliteal DVTs, so IVC filter was not placed Plan: Holding [facility]   Best practice:  Diet: Tube feeds Pain/Anxiety/Delirium protocol (if indicated) fentanyl  VAP protocol (if indicated): yes DVT prophylaxis: SCDs GI prophylaxis: Protonix Glucose control: lantus + ssi  Mobility: Bedrest Code Status: Full Family Communication: updated at bedside yesterday  Disposition: ICU  This patient is critically ill with multiple organ system failure; which, requires frequent high complexity decision making, assessment, support, evaluation, and titration of therapies. This was completed through the application of advanced monitoring technologies and extensive interpretation of multiple databases. During this encounter critical care time was devoted to patient care services described in this note for 31 minutes.  Garner Nash, DO [facility] 09/26/2020 7:09 AM

## 2020-09-26 NOTE — Progress Notes (Signed)
RT found pt on 100% fio2 as pt had low spo2 last night and RT obtained ABG but it clotted. Pt remained on 100% despite monitor reading 100% with good wave form. This RT attempted ABG this am with no luck. Pt remained 100% on monitor so this RT decreased her to 60% FiO2 and will go by monitor. RN and MD made aware

## 2020-09-26 NOTE — Progress Notes (Signed)
[place] Progress Note Patient Name: Candace [name] DOB: [birth year] MRN: [id number]   Date of Service  09/26/2020  HPI/Events of Note  RN requests order for ABG in AM.    eICU Interventions  Order entered.     Intervention Category Minor Interventions: Routine modifications to care plan (e.g. PRN medications for pain, fever)  Marily Lente Zaidee Rion 09/26/2020, 1:21 AM

## 2020-09-27 DIAGNOSIS — R6521 Severe sepsis with septic shock: Secondary | ICD-10-CM | POA: Diagnosis not present

## 2020-09-27 DIAGNOSIS — J9601 Acute respiratory failure with hypoxia: Secondary | ICD-10-CM | POA: Diagnosis not present

## 2020-09-27 DIAGNOSIS — A419 Sepsis, unspecified organism: Secondary | ICD-10-CM | POA: Diagnosis not present

## 2020-09-27 DIAGNOSIS — J9602 Acute respiratory failure with hypercapnia: Secondary | ICD-10-CM | POA: Diagnosis not present

## 2020-09-27 LAB — RENAL FUNCTION PANEL
Albumin: 2 g/dL — ABNORMAL LOW (ref 3.5–5.0)
Albumin: 1.6 g/dL — ABNORMAL LOW (ref 3.5–5.0)
Anion gap: 9 (ref 5–15)
Anion gap: 10 (ref 5–15)
BUN: 24 mg/dL — ABNORMAL HIGH (ref 8–23)
BUN: 24 mg/dL — ABNORMAL HIGH (ref 8–23)
CO2: 25 mmol/L (ref 22–32)
CO2: 23 mmol/L (ref 22–32)
Calcium: 8.8 mg/dL — ABNORMAL LOW (ref 8.9–10.3)
Calcium: 8.4 mg/dL — ABNORMAL LOW (ref 8.9–10.3)
Chloride: 100 mmol/L (ref 98–111)
Chloride: 97 mmol/L — ABNORMAL LOW (ref 98–111)
Creatinine, Ser: 0.45 mg/dL (ref 0.44–1.00)
Creatinine, Ser: 0.54 mg/dL (ref 0.44–1.00)
GFR, Estimated: >60 mL/min (ref >60)
GFR, Estimated: >60 mL/min (ref >60)
Glucose, Bld: 118 mg/dL — ABNORMAL HIGH (ref 70–99)
Glucose, Bld: 336 mg/dL — ABNORMAL HIGH (ref 70–99)
Phosphorus: 2.3 mg/dL — ABNORMAL LOW (ref 2.5–4.6)
Phosphorus: 6.9 mg/dL — ABNORMAL HIGH (ref 2.5–4.6)
Potassium: 4.5 mmol/L (ref 3.5–5.1)
Potassium: 5.9 mmol/L — ABNORMAL HIGH (ref 3.5–5.1)
Sodium: 134 mmol/L — ABNORMAL LOW (ref 135–145)
Sodium: 130 mmol/L — ABNORMAL LOW (ref 135–145)

## 2020-09-27 LAB — DIFFERENTIAL
Abs Immature Granulocytes: 0.4 10*3/uL — ABNORMAL HIGH (ref 0.00–0.07)
Basophils Absolute: 0 10*3/uL (ref 0.0–0.1)
Basophils Relative: 0 %
Eosinophils Absolute: 0 10*3/uL (ref 0.0–0.5)
Eosinophils Relative: 0 %
Immature Granulocytes: 4 %
Lymphocytes Relative: 3 %
Lymphs Abs: 0.3 10*3/uL — ABNORMAL LOW (ref 0.7–4.0)
Monocytes Absolute: 0.1 10*3/uL (ref 0.1–1.0)
Monocytes Relative: 1 %
Neutro Abs: 9.9 10*3/uL — ABNORMAL HIGH (ref 1.7–7.7)
Neutrophils Relative %: 92 %

## 2020-09-27 LAB — CBC
HCT: 25.9 % — ABNORMAL LOW (ref 36.0–46.0)
Hemoglobin: 8 g/dL — ABNORMAL LOW (ref 12.0–15.0)
MCH: 28.6 pg (ref 26.0–34.0)
MCHC: 30.9 g/dL (ref 30.0–36.0)
MCV: 92.5 fL (ref 80.0–100.0)
Platelets: 77 10*3/uL — ABNORMAL LOW (ref 150–400)
RBC: 2.8 MIL/uL — ABNORMAL LOW (ref 3.87–5.11)
RDW: 20.2 % — ABNORMAL HIGH (ref 11.5–15.5)
WBC: 10.8 10*3/uL — ABNORMAL HIGH (ref 4.0–10.5)
nRBC: 5.1 % — ABNORMAL HIGH (ref 0.0–0.2)

## 2020-09-27 LAB — GLUCOSE, CAPILLARY
Glucose-Capillary: 132 mg/dL — ABNORMAL HIGH (ref 70–99)
Glucose-Capillary: 134 mg/dL — ABNORMAL HIGH (ref 70–99)
Glucose-Capillary: 143 mg/dL — ABNORMAL HIGH (ref 70–99)
Glucose-Capillary: 155 mg/dL — ABNORMAL HIGH (ref 70–99)
Glucose-Capillary: 35 mg/dL — CL (ref 70–99)
Glucose-Capillary: 351 mg/dL — ABNORMAL HIGH (ref 70–99)
Glucose-Capillary: 360 mg/dL — ABNORMAL HIGH (ref 70–99)
Glucose-Capillary: 83 mg/dL (ref 70–99)

## 2020-09-27 LAB — PREALBUMIN: Prealbumin: 7.3 mg/dL — ABNORMAL LOW (ref 18–38)

## 2020-09-27 LAB — CULTURE, BLOOD (ROUTINE X 2): Culture: NO GROWTH

## 2020-09-27 LAB — MAGNESIUM: Magnesium: 2.2 mg/dL (ref 1.7–2.4)

## 2020-09-27 LAB — TRIGLYCERIDES: Triglycerides: 108 mg/dL (ref <150)

## 2020-09-27 MED ORDER — PRISMASOL BGK 0/2.5 32-2.5 MEQ/L EC SOLN
Status: DC
  Filled 2020-09-27 (×2): qty 5000

## 2020-09-27 MED ORDER — POTASSIUM PHOSPHATES 15 MMOLE/5ML IV SOLN
20.0000 mmol | Freq: Once | INTRAVENOUS | Status: AC
  Administered 2020-09-27: 20 mmol via INTRAVENOUS
  Filled 2020-09-27: qty 6.67

## 2020-09-27 MED ORDER — EPINEPHRINE 1 MG/10ML IJ SOSY
PREFILLED_SYRINGE | INTRAMUSCULAR | Status: AC
  Filled 2020-09-27: qty 10

## 2020-09-27 MED ORDER — PRISMASOL BGK 0/2.5 32-2.5 MEQ/L REPLACEMENT SOLN: Status: DC

## 2020-09-27 MED ORDER — ATROPINE SULFATE 1 MG/10ML IJ SOSY
PREFILLED_SYRINGE | INTRAMUSCULAR | Status: AC
  Filled 2020-09-27: qty 10

## 2020-09-27 NOTE — Progress Notes (Signed)
NAMEKeirsten [name], MRN:  [id number], DOB:  Mar 28, 1951, LOS: 92 ADMISSION DATE:  08/31/2020, CONSULTATION DATE:  09/07/2020 REFERRING MD:  Dr Candiss Norse, CHIEF COMPLAINT:  Acute resp failure  Brief History   69 year old female who was previously diagnosed with Covid 08/23/2020.  Admitted 11/2 with AF-RVR, found to have a perforated duodenal underwent exploratory laparotomy with Phillip Heal patch placement.  Past Medical History  Covid pneumonia Atrial fibrillation CKD stage III Diabetes mellitus Hypertension Colon cancer Hyperlipidemia  Significant Hospital Events   11/2 Admitted  11/3 OR -> perforated duodenal ulcer 11/10 progressive hemorrhagic shock, intubated, transfused, pressors, proned; started on CRRT in PM 11/16 Extubated. Re-intubated overnight due to respiratory distress and hypoxia with decreased mentation 11/18  bronch'd/ cultures sent 11/19 hgb down getting blood 09/22/2020 spiked fever resume empirical antimicrobial therapy 11/26: hemorrhagic shock, hgb 5.6, increased pressors, CT A/P   Consults:  Cardiology [facility] Nephrology   Procedures:  11/3 Exploratory laparotomy, Phillip Heal patch, lysis of adhesion for duodenal ulceration postop day 6  R PICC 11/5 >> A line 11/9 >> out ETT 11/9 > 11/16, 11/16 >> 09/21/2020 09/21/2020 tracheostomy>> Lt [place] CVL 11/9 >> R IJ trialysis >> out  Significant Diagnostic Tests:  11/3 CT abd/ pelvis > 1. Positive for bowel perforation: Pneumoperitoneum and intermediate density free fluid in the abdomen. Prior total colectomy. The specific site of perforation is unclear-oral contrast present to the proximal jejunum has not obviously leaked. Note that there may be small bowel loops adherent to the ventral abdominal wall along the greater curve of the stomach. 2. Extensive bilateral lower lung pneumonia. No pleural effusion. 3. Other abdominal and pelvic viscera are stable since 2015, including bilateral adrenal adenomas. Chronic renal  parapelvic cysts. 4. Aortic Atherosclerosis 11/3 TTE > EF 70-75%, RV not well visualized, mildly reduced RV systolic function 72/53 CT chest/ abd/ pelvis> 1. Interval progression of diffuse bilateral hazy ground-glass airspace opacities with more focal areas of consolidation at the lung bases 2. Trace bilateral pleural effusions. 3. Postsurgical changes the abdomen as detailed above. No evidence for a postoperative abscess, however evaluation is limited by lack of IV contrast. 4. There is a 1.9 cm cystic appearing lesion located in the pancreatic body. This was not present on the patient's CT from 2015.  Follow-up with an outpatient contrast enhanced MRI is recommended. 5. The endometrial stripe appears diffusely thickened. Follow-up with pelvic ultrasound is recommended. Aortic Atherosclerosis 11/14 LE doppler studies > + DVT of right posterior tibial and peroneal vein, +dVT of left posterior tibial vein  11/26 CT ABD PEL > liver unremarkable, distended gallbladder with layering tiny gallstones versus sludge, no duct dilatation, mild hyperdensity right upper pole renal collecting system new.  No evidence of retroperitoneal bleeding.  Multifocal lower lobe predominant pulmonary infiltrates/pneumonia.  Small left pleural effusion.  Micro Data:  11/10 MRSA PCR > neg 11/10 BC x 2 > neg 09/22/2020 blood cultures x2>> S epi.  09/22/2020 sputum culture>> MRSA  Antimicrobials:  azithro 11/2 >11/3 Ceftriaxone 11/2  Fluconazole 11/3 Zosyn 11/3 >> 11/7 Vanc 11/10 off Cefepime 11/10 > 11/16  09/22/2020 vancomycin for MRSA PNA >>  09/22/2020 >> 11/26   Subjective:   Remains on CVVH Norepinephrine 13 Fentanyl 150  Objective   Blood pressure 106/69, pulse (!) 132, temperature 98.3 F (36.8 C), temperature source Axillary, resp. rate (!) 39, height 5\' 8"  (1.727 m), weight 85 kg, SpO2 (!) 85 %.    Vent Mode: PRVC FiO2 (%):  [50 %-70 %] 50 %  Set Rate:  [26 bmp] 26 bmp Vt Set:  [510 mL] 510  mL PEEP:  [5 cmH20] 5 cmH20 Plateau Pressure:  [24 cmH20-30 cmH20] 24 cmH20   Intake/Output Summary (Last 24 hours) at 09/27/2020 0955 Last data filed at 09/27/2020 0900 Gross per 24 hour  Intake 3907.53 ml  Output 4309 ml  Net -401.47 ml   Filed Weights   09/25/20 0600 09/26/20 0430 09/27/20 0424  Weight: 86 kg 84.5 kg 85 kg    Physical Exam: General: Elderly acute and chronically ill woman, ventilated HEENT: Tracheostomy in place, clean and dry, ventilated, oropharynx otherwise clear, pupils equal Neuro: She is awake, fentanyl just increased due to tachypnea, still nods to questions, follows commands, turns and tracks.  Globally weak CV: Regular, no murmur PULM: Coarse bilateral breath sounds GI: Ileostomy, brown stool, positive bowel sounds GU: Foley catheter in place Extremities: Bilateral upper extremity edema, trace lower extremity Skin: No rash  Assessment & Plan:   Hemorrhagic shock  Acute blood loss anemia  New Drop in hgb 5.6 on 11/26 >> 8.0 on 11/19 Plan  -Follow CBC and for any signs of blood loss  Sedation needs, anxiety.  Has been difficult to wean off of sedating medication -Scheduled oxycodone every 6 hours -Has been difficult to wean from continue sedation, still requiring fentanyl 150 -More awake today  Acute hypoxic/hypercapnic respiratory failure due to ARDS from COVID-19 pneumonia c/b HCAP and failure to wean and failed extubation 11/16 Plan -Unclear whether she will fully wean from mechanical ventilation, may require long-term vent.  Patient family understands.  Options include LTAC/vent SNF versus home ventilator.  Also unclear whether this is consistent with the patient's overall wishes.  Further discussions to take place  Multifactorial shock, previously hemorrhagic, Now Septic shock, MRSA pneumonia 1/4 bottles BCX with Meth R Staph Epi, likely contaminant Plan -Continue vancomycin, day 4 on 11/29  Anuric AKI on CRRT Plan -Appreciate  nephrology management of her CVVHD. -Have been unable to make the transition to intermittent HD due to shock state  Duodenal ulcer perforation status post ex lap and Graham's patch Plan -Greatly appreciate CCS assistance and management -Considering PEG tube placement but discussed with CCS 11/29, likely not a candidate least currently given the associated risk of gastric insufflation that would be required.  Paroxysmal A. fib, currently in sinus rhythm Plan -Telemetry monitoring -And have not restart anticoagulation given recurrent GI bleeding  Thrombocytopenia Plan -Following CBC  Glucose control Plan -Continue current Lantus and sliding scale insulin  1.9 cm cystic appearing lesion located in the pancreatic body Plan -No intervention at this time, plan outpatient follow-up  History of stage IV colon cancer Status post pulmonary metastatectomies at [facility] + Chemo -No dementia at this time  Indeterminate bilateral DVT Patient had bilateral lower extremity Dopplers done, suggestive of possible indeterminate DVT, spoke with IR for IVC filter placement, after reevaluating ultrasound of lower extremities it was suggested patient does not have femoral/popliteal DVTs, so IVC filter was not placed Plan: -Currently following off anticoagulation  Best practice:  Diet: Tube feeds Pain/Anxiety/Delirium protocol (if indicated) fentanyl drip, oxycodone per tube VAP protocol (if indicated): yes DVT prophylaxis: SCDs GI prophylaxis: Protonix Glucose control: lantus + ssi  Mobility: Bedrest Code Status: Full Family Communication: Plan to speak with family 11/29 Disposition: ICU   This patient is critically ill with multiple organ system failure; which, requires frequent high complexity decision making, assessment, support, evaluation, and titration of therapies. This was completed through the application of  advanced monitoring technologies and extensive interpretation of multiple  databases. During this encounter critical care time was devoted to patient care services described in this note for 33 minutes.   Baltazar Apo, MD, PhD 09/27/2020, 10:16 AM [facility] [phone] or if no answer [phone]

## 2020-09-27 NOTE — Progress Notes (Signed)
HR down to 42 from 80 on monitor. Upon assessment pt appearing in acute distress coughing uncontrollably with sp02 of 80. HR returned to baseline w/o intervention. Pt suctioned with minimal OP and given prn fentanyl bolus. Elink notified

## 2020-09-27 NOTE — Progress Notes (Signed)
Physical Therapy Treatment Patient Details Name: Ramonia Mcclaran MRN: [id number] DOB: [birth year] Today's Date: 09/27/2020    History of Present Illness 69 y.o. female with medical history significant for recent covid pna, obesity, colon cancer s/p colon resection with colostomy bag, HLD, NIDDM2, CKD3 presented to ED after her follow-up nurse advised her to present to ED for elevated HR. +afib, elevated troponins with demand ischemia,  CT scan is positive for bowel perforation with pneumomediastinum 11/03 exp lap with repair of perforated ulcer. 11/10 t/f to ICU- intubated/sedated/proned. Trach placed 11/24.    PT Comments    Pt only responsive to head nod and squeezing hands bilaterally at entry. Cognition difficult to assess secondary to lack of response, but RN stated that she was interacting with her earlier today. Pt had no muscle activation during ROM in all extremities. Pt unable to participate in bed mobility or transfers as in previous session due to lack of participation in therapy. Pt remains appropriate for 2x/week due participation in therapy and d/c plan remains appropriate secondary to level of functional mobility.  Resting vitals: 125 bpm, 93% SpO2, FiO2 50%, 35-42 RR  Follow Up Recommendations  SNF;Supervision/Assistance - 24 hour     Equipment Recommendations  Wheelchair (measurements PT);Wheelchair cushion (measurements PT);Hospital bed;Other (comment)    Recommendations for Other Services       Precautions / Restrictions Precautions Precautions: Fall Precaution Comments: baseline colostomy; trach-vent; CRRT Restrictions Weight Bearing Restrictions: No    Mobility  Bed Mobility               General bed mobility comments: not assessed secondary to lack of responsiveness  Transfers                    Ambulation/Gait                 Stairs             Wheelchair Mobility    Modified Rankin (Stroke Patients Only)       Balance        Sitting balance - Comments: not assessed       Standing balance comment: not assessed                            Cognition Arousal/Alertness: Lethargic Behavior During Therapy: WFL for tasks assessed/performed Overall Cognitive Status: Difficult to assess                                 General Comments: unresponsive to commands, only able to squeeze hands and nod head once upon entry      Exercises General Exercises - Upper Extremity Shoulder Flexion: PROM;Both;10 reps;Supine Shoulder ABduction: PROM;Both;Supine Shoulder ADduction: PROM;Both;Supine Elbow Flexion: PROM;10 reps;Supine;Both Elbow Extension: PROM;Both;10 reps;Supine General Exercises - Lower Extremity Ankle Circles/Pumps: PROM;Both;10 reps;Supine Heel Slides: PROM;Both;10 reps;Supine Hip ABduction/ADduction: PROM;10 reps;Both;Supine Other Exercises Other Exercises: retrograde masssage to RUE to reduce edema    General Comments General comments (skin integrity, edema, etc.): R hand edema, retrograde massage performed      Pertinent Vitals/Pain Pain Assessment: Faces Faces Pain Scale: No hurt    Home Living                      Prior Function            PT Goals (current goals can now be found in  the care plan section) Acute Rehab PT Goals Patient Stated Goal: not able to state Progress towards PT goals: Not progressing toward goals - comment (decreased responsiveness to therapy)    Frequency    Min 2X/week      PT Plan Current plan remains appropriate    Co-evaluation              AM-PAC PT "6 Clicks" Mobility   Outcome Measure  Help needed turning from your back to your side while in a flat bed without using bedrails?: Total Help needed moving from lying on your back to sitting on the side of a flat bed without using bedrails?: Total Help needed moving to and from a bed to a chair (including a wheelchair)?: Total Help needed standing up  from a chair using your arms (e.g., wheelchair or bedside chair)?: Total Help needed to walk in hospital room?: Total Help needed climbing 3-5 steps with a railing? : Total 6 Click Score: 6    End of Session   Activity Tolerance: Patient limited by lethargy Patient left: in bed;with call bell/phone within reach;with nursing/sitter in room Nurse Communication: Mobility status PT Visit Diagnosis: Muscle weakness (generalized) (M62.81);Difficulty in walking, not elsewhere classified (R26.2);Pain;Adult, failure to thrive (R62.7);Unsteadiness on feet (R26.81)     Time: 7616-0737 PT Time Calculation (min) (ACUTE ONLY): 21 min  Charges:  $Therapeutic Exercise: 8-22 mins                    Caleb Popp, SPT [id number]   Brailynn Breth 09/27/2020, 1:42 PM

## 2020-09-27 NOTE — Progress Notes (Signed)
Patient ID: Glennie Isle, female   DOB: 1950/12/15, 69 y.o.   MRN: [id number] S: No events overnight but Hgb continues to drop.  Seen and examined while on CRRT. O:BP 104/63   Pulse (!) 162   Temp (!) 97.3 F (36.3 C) (Axillary)   Resp (!) 26   Ht 5' 8"  (1.727 m)   Wt 85 kg   SpO2 92%   BMI 28.49 kg/m   Intake/Output Summary (Last 24 hours) at 09/27/2020 1314 Last data filed at 09/27/2020 1300 Gross per 24 hour  Intake 3729.8 ml  Output 3878 ml  Net -148.2 ml   Intake/Output: I/O last 3 completed shifts: In: 6314 [I.V.:1707.5; NG/GT:2750; IV Piggyback:920.5] Out: 9702 [OVZCH:8850; Stool:550]  Intake/Output this shift:  Total I/O In: 883.1 [I.V.:254.1; NG/GT:490; IV Piggyback:138.9] Out: 585 [Other:585] Weight change: 0.5 kg Gen: intubated and sedated CVS: tachy at 162 Resp: bilateral rhonchi  Abd: ileostomy in place, +BS Ext: + edema of upper extremities, 1+ presacral edema  Recent Labs  Lab 09/24/20 0443 09/24/20 0443 09/24/20 1603 09/25/20 0416 09/25/20 1647 09/26/20 0304 09/26/20 1604 09/26/20 1843 09/27/20 0427  NA 135   < > 133* 134* 135 135 134* 135 134*  K 4.2   < > 3.7 3.7 4.3 4.0 4.6 4.8 4.5  CL 100  --  99 102 102 101 101  --  100  CO2 22  --  25 24 23 26 24   --  25  GLUCOSE 235*  --  122* 106* 171* 145* 159*  --  118*  BUN 38*  --  37* 30* 31* 25* 27*  --  24*  CREATININE 0.70  --  0.63 0.53 0.57 0.51 0.48  --  0.45  ALBUMIN 2.2*  --  1.9* 1.7* 1.5* 1.5* 1.4*  --  2.0*  CALCIUM 9.1  --  9.2 9.0 8.1* 8.5* 8.0*  --  8.8*  PHOS 3.0  --  3.0 2.4* 2.9 2.0* 3.7  --  2.3*   < > = values in this interval not displayed.   Liver Function Tests: Recent Labs  Lab 09/26/20 0304 09/26/20 1604 09/27/20 0427  ALBUMIN 1.5* 1.4* 2.0*   No results for input(s): LIPASE, AMYLASE in the last 168 hours. No results for input(s): AMMONIA in the last 168 hours. CBC: Recent Labs  Lab 09/23/20 0303 09/23/20 0303 09/24/20 0443 09/24/20 1454 09/25/20 0416  09/25/20 0416 09/25/20 1755 09/25/20 1755 09/26/20 0304 09/26/20 1843 09/27/20 0427  WBC 18.7*   < > 13.2*  --  11.1*   < > 10.4  --  12.3*  --  10.8*  NEUTROABS 17.5*  --   --   --   --   --   --   --   --   --  9.9*  HGB 7.7*   < > 5.6*   < > 9.7*   < > 9.6*   < > 9.6* 9.9* 8.0*  HCT 25.6*   < > 19.0*   < > 30.1*   < > 30.7*   < > 30.9* 29.0* 25.9*  MCV 89.5   < > 90.9  --  88.5  --  89.0  --  90.4  --  92.5  PLT 79*   < > 105*  --  89*   < > 91*  --  73*  --  77*   < > = values in this interval not displayed.   Cardiac Enzymes: No results for input(s): CKTOTAL, CKMB, CKMBINDEX, TROPONINI  in the last 168 hours. CBG: Recent Labs  Lab 09/26/20 2351 09/27/20 0400 09/27/20 0732 09/27/20 1155 09/27/20 1157  GLUCAP 120* 83 132* 360* 351*    Iron Studies: No results for input(s): IRON, TIBC, TRANSFERRIN, FERRITIN in the last 72 hours. Studies/Results: DG CHEST PORT 1 VIEW  Result Date: 09/25/2020 CLINICAL DATA:  Acute respiratory failure due to COVID, MRSA. EXAM: PORTABLE CHEST 1 VIEW COMPARISON:  Chest x-ray 09/23/2020, CT chest 09/24/2020 FINDINGS: Tracheostomy tube terminating approximately 4.5 cm above the carina. Enteric tube coursing below diaphragm with tip collimated off view. Left chest wall dialysis catheter with tip overlying the right atrium. Right PICC with tip overlying the expected region of the distal superior vena cava. The remainder of the lines and tubes likely external to the patient. The heart size and mediastinal contours are within normal limits. Grossly unchanged (compared to CT 09/24/2020) bilateral patchy airspace opacities that are most prominent in bilateral lower lung zones and right mid lung zone. No pulmonary edema. Cannot exclude trace pleural effusions bilaterally. No pneumothorax. No acute osseous abnormality. IMPRESSION: 1. Grossly unchanged multifocal pneumonia. 2. Lines and tubes in similar position. Electronically Signed   By: Iven Finn M.D.    On: 09/25/2020 18:57   . sodium chloride   Intravenous Once  . B-complex with vitamin C  1 tablet Per Tube Daily  . chlorhexidine gluconate (MEDLINE KIT)  15 mL Mouth Rinse BID  . Chlorhexidine Gluconate Cloth  6 each Topical Daily  . chlorpheniramine-HYDROcodone  5 mL Per Tube Q12H  . [START ON 09/28/2020] darbepoetin (ARANESP) injection - DIALYSIS  60 mcg Subcutaneous Q Tue-1800  . feeding supplement (PROSource TF)  45 mL Per Tube BID  . insulin aspart  0-20 Units Subcutaneous Q4H  . insulin aspart  8 Units Subcutaneous Q4H  . insulin detemir  20 Units Subcutaneous Daily  . mouth rinse  15 mL Mouth Rinse QID  . oxyCODONE  10 mg Per Tube Q6H  . pantoprazole sodium  40 mg Per Tube BID  . sodium chloride flush  10-40 mL Intracatheter Q12H    BMET    Component Value Date/Time   NA 134 (L) 09/27/2020 0427   K 4.5 09/27/2020 0427   CL 100 09/27/2020 0427   CO2 25 09/27/2020 0427   GLUCOSE 118 (H) 09/27/2020 0427   BUN 24 (H) 09/27/2020 0427   CREATININE 0.45 09/27/2020 0427   CALCIUM 8.8 (L) 09/27/2020 0427   GFRNONAA >60 09/27/2020 0427   CBC    Component Value Date/Time   WBC 10.8 (H) 09/27/2020 0427   RBC 2.80 (L) 09/27/2020 0427   HGB 8.0 (L) 09/27/2020 0427   HCT 25.9 (L) 09/27/2020 0427   PLT 77 (L) 09/27/2020 0427   MCV 92.5 09/27/2020 0427   MCH 28.6 09/27/2020 0427   MCHC 30.9 09/27/2020 0427   RDW 20.2 (H) 09/27/2020 0427   LYMPHSABS 0.3 (L) 09/27/2020 0427   MONOABS 0.1 09/27/2020 0427   EOSABS 0.0 09/27/2020 0427   BASOSABS 0.0 09/27/2020 0427     Assessment/Plan:  1. AKI- oliguric presumably due to ischemic ATN in setting of hemorrhagic shock.  Started on CRRT 09/08/20 due to persistent oliguria and hyperkalemia.   1. All fluids 4K/2.5Ca:  Pre-filter 400 ml/hr, post-filter 300 ml/hr, dialysate 1,500 ml/hr 2. UF goal 50 ml/hr as bp tolerates 3. No anticoagulation due to thrombocytopenia and ongoing GI blood loss. 4. Continue with current CRRT settings.   2. Hemorrhagic shock- ongoing blood loss and  requiring pressors. 3. Septic shock due to MRSA pneumonia. 4. Perforated duodenal ulcer- s/p exploratory lap and Graham patch placement per surgery. 5. Acute hypoxic/hypercapnic respiratory failure due to ARDS from Recent Covid-19 PNA- currently on vent via trach per PCCM.   6. Thrombocytopenia- follow 7. P. Atrial fibrillation- no anticoagulation due to ongoing GI bleed. 8. Vascular access- RIJ trialysis catheter 09/08/20 then removed. Left subclavian trialysis catheter placed 09/15/20 and will need new access this week.  Hopefully can have [facility] given ongoing anuric renal failure (unless family wishes to continue with current level of care). 9. Disposition- poor overall prognosis and will likely require LTC and chronic vent support.  PCCM to discuss goals of care with family.   Donetta Potts, MD [employer] [phone]

## 2020-09-27 NOTE — Progress Notes (Signed)
26 Days Post-Op  Subjective: Patient awake on the vent.  Answers yes and no questions.  Says she is having some abdominal discomfort.    ROS: See above, otherwise other systems negative  Objective: Vital signs in last 24 hours: Temp:  [96.8 F (36 C)-98.3 F (36.8 C)] 98.3 F (36.8 C) (11/29 0721) Pulse Rate:  [59-208] 208 (11/29 0815) Resp:  [19-46] 32 (11/29 0830) BP: (52-149)/(36-92) 92/49 (11/29 0830) SpO2:  [79 %-100 %] 95 % (11/29 0815) FiO2 (%):  [50 %-70 %] 50 % (11/29 0748) Weight:  [85 kg] 85 kg (11/29 0424) Last BM Date: 09/27/20  Intake/Output from previous day: 11/28 0701 - 11/29 0700 In: 3988.9 [I.V.:1248.4; NG/GT:1820; IV Piggyback:920.5] Out: 4703 [Stool:400] Intake/Output this shift: Total I/O In: 103.2 [I.V.:38.2; NG/GT:65] Out: 96 [Other:96]  PE: Lungs: on vent, trach Heart: tachy Abd: soft, minimally tender, midline incision is clean, with viable granulation tissue, but a little dusky.  Ostomy is working, stoma is pink and viable.  Lab Results:  Recent Labs    09/26/20 0304 09/26/20 0304 09/26/20 1843 09/27/20 0427  WBC 12.3*  --   --  10.8*  HGB 9.6*   < > 9.9* 8.0*  HCT 30.9*   < > 29.0* 25.9*  PLT 73*  --   --  77*   < > = values in this interval not displayed.   BMET Recent Labs    09/26/20 1604 09/26/20 1604 09/26/20 1843 09/27/20 0427  NA 134*   < > 135 134*  K 4.6   < > 4.8 4.5  CL 101  --   --  100  CO2 24  --   --  25  GLUCOSE 159*  --   --  118*  BUN 27*  --   --  24*  CREATININE 0.48  --   --  0.45  CALCIUM 8.0*  --   --  8.8*   < > = values in this interval not displayed.   PT/INR No results for input(s): LABPROT, INR in the last 72 hours. CMP     Component Value Date/Time   NA 134 (L) 09/27/2020 0427   K 4.5 09/27/2020 0427   CL 100 09/27/2020 0427   CO2 25 09/27/2020 0427   GLUCOSE 118 (H) 09/27/2020 0427   BUN 24 (H) 09/27/2020 0427   CREATININE 0.45 09/27/2020 0427   CALCIUM 8.8 (L) 09/27/2020 0427    PROT 5.1 (L) 09/15/2020 0443   ALBUMIN 2.0 (L) 09/27/2020 0427   AST 127 (H) 09/15/2020 0443   ALT 515 (H) 09/15/2020 0443   ALKPHOS 287 (H) 09/15/2020 0443   BILITOT 1.1 09/15/2020 0443   GFRNONAA >60 09/27/2020 0427   Lipase     Component Value Date/Time   LIPASE 48 08/31/2020 2054       Studies/Results: DG CHEST PORT 1 VIEW  Result Date: 09/25/2020 CLINICAL DATA:  Acute respiratory failure due to COVID, MRSA. EXAM: PORTABLE CHEST 1 VIEW COMPARISON:  Chest x-ray 09/23/2020, CT chest 09/24/2020 FINDINGS: Tracheostomy tube terminating approximately 4.5 cm above the carina. Enteric tube coursing below diaphragm with tip collimated off view. Left chest wall dialysis catheter with tip overlying the right atrium. Right PICC with tip overlying the expected region of the distal superior vena cava. The remainder of the lines and tubes likely external to the patient. The heart size and mediastinal contours are within normal limits. Grossly unchanged (compared to CT 09/24/2020) bilateral patchy airspace opacities that are most  prominent in bilateral lower lung zones and right mid lung zone. No pulmonary edema. Cannot exclude trace pleural effusions bilaterally. No pneumothorax. No acute osseous abnormality. IMPRESSION: 1. Grossly unchanged multifocal pneumonia. 2. Lines and tubes in similar position. Electronically Signed   By: Iven Finn M.D.   On: 09/25/2020 18:57    Anti-infectives: Anti-infectives (From admission, onward)   Start     Dose/Rate Route Frequency Ordered Stop   09/24/20 1000  ceFAZolin (ANCEF) IVPB 2g/100 mL premix  Status:  Discontinued        2 g 200 mL/hr over 30 Minutes Intravenous Every 12 hours 09/24/20 0801 09/24/20 1046   09/23/20 1400  vancomycin (VANCOCIN) IVPB 1000 mg/200 mL premix        1,000 mg 200 mL/hr over 60 Minutes Intravenous Every 24 hours 09/22/20 1436     09/22/20 2200  ceFEPIme (MAXIPIME) 2 g in sodium chloride 0.9 % 100 mL IVPB  Status:   Discontinued        2 g 200 mL/hr over 30 Minutes Intravenous Every 12 hours 09/22/20 1436 09/24/20 0801   09/22/20 1030  ceFEPIme (MAXIPIME) 1 g in sodium chloride 0.9 % 100 mL IVPB        1 g 200 mL/hr over 30 Minutes Intravenous  Once 09/22/20 0934 09/22/20 1145   09/22/20 1015  vancomycin (VANCOCIN) IVPB 1000 mg/200 mL premix        1,000 mg 200 mL/hr over 60 Minutes Intravenous  Once 09/22/20 0934 09/22/20 1446   09/12/20 2200  ceFEPIme (MAXIPIME) 2 g in sodium chloride 0.9 % 100 mL IVPB        2 g 200 mL/hr over 30 Minutes Intravenous Every 12 hours 09/12/20 0732 09/14/20 2134   09/11/20 1400  ceFEPIme (MAXIPIME) 2 g in sodium chloride 0.9 % 100 mL IVPB  Status:  Discontinued        2 g 200 mL/hr over 30 Minutes Intravenous Every 8 hours 09/11/20 1126 09/12/20 0732   09/08/20 1000  vancomycin (VANCOREADY) IVPB 2000 mg/400 mL        2,000 mg 200 mL/hr over 120 Minutes Intravenous  Once 09/08/20 0857 09/08/20 1224   09/08/20 1000  ceFEPIme (MAXIPIME) 2 g in sodium chloride 0.9 % 100 mL IVPB  Status:  Discontinued        2 g 200 mL/hr over 30 Minutes Intravenous Every 12 hours 09/08/20 0857 09/11/20 1126   09/08/20 0856  vancomycin variable dose per unstable renal function (pharmacist dosing)  Status:  Discontinued         Does not apply See admin instructions 09/08/20 0857 09/09/20 0935   09/02/20 1600  cefTRIAXone (ROCEPHIN) 1 g in sodium chloride 0.9 % 100 mL IVPB  Status:  Discontinued        1 g 200 mL/hr over 30 Minutes Intravenous Every 24 hours 09/01/20 1811 09/02/20 0838   09/01/20 1800  fluconazole (DIFLUCAN) IVPB 400 mg        400 mg 50 mL/hr over 240 Minutes Intravenous  Once 09/01/20 1749 09/02/20 0603   09/01/20 1530  piperacillin-tazobactam (ZOSYN) IVPB 3.375 g        3.375 g 12.5 mL/hr over 240 Minutes Intravenous Every 8 hours 09/01/20 1514 09/05/20 2111   09/01/20 1000  levofloxacin (LEVAQUIN) tablet 250 mg  Status:  Discontinued        250 mg Oral Daily 08/31/20  1508 08/31/20 1735   08/31/20 1730  cefTRIAXone (ROCEPHIN) 1 g in sodium chloride  0.9 % 100 mL IVPB  Status:  Discontinued        1 g 200 mL/hr over 30 Minutes Intravenous Every 24 hours 08/31/20 1726 09/01/20 1513   08/31/20 1730  azithromycin (ZITHROMAX) 500 mg in sodium chloride 0.9 % 250 mL IVPB  Status:  Discontinued        500 mg 250 mL/hr over 60 Minutes Intravenous Every 24 hours 08/31/20 1726 09/02/20 0838       Assessment/Plan HTN HLD DM Hx colon cancer s/p partial colectomy/colostomy followed by completion colectomy and ileostomy Covid-19 pneumonia  Elevated D dimer on eliquis A fib RVR Anemia 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 Bobbye Morton -POD#26 - continue PPI BID - UGI negative for leak11/8 -JP removed last week -tolerating TF - BID wet to dry dressing changes to midline abdominal wound  - CT abdomen/pelvis 11/10 stable without evidence of postop complication - surgically stable.  Patient is awake today and able to nod her head yes and no.  I did ask the patient if she understood that if all her machines were turned off right now that she would not be able to survive.  She nodded yes.  I asked if she wanted to continue aggressive care with all of these machines.  She nodded no.  I reiterated that that would lead to death right now and she nodded yes she understood.  I asked if she had had this conversation with her family.  She shook her head no.  I asked if she was ok if I discussed our conversation with  her family.  She nodded yes.  I spoke to [place] today for a surgical update and to let her know that a PEG tube is not recommended at this time as blowing her stomach with air after having a perforation fixed is risky for causing a recurrent perforation, especially in the setting of poor nutrition and pressors.  She understood.  I also discussed with the patient's daughter the conversation the patient and I had.  I encouraged the daughter that when she comes to visit her mother to have a similar conversation so she can see where her mother stands on her care right now in order to help them make decisions moving forward.  I discussed all of this with Dr. Lamonte Sakai so he is aware of what conversation was had and that medical questions regarding her overall prognosis, vent, pressor, and CRRT needs I would defer to CCM.   -no acute surgical interventions warranted currently.  ID -zosyn 11/3>>11/8.vancomycin 11/10.Maxipime11/10>11/16 VTE -heparin gtt FEN -OG, TF @ 65cc/hr Foley -replaced 11/9 Follow up -Dr. Chrystie Nose - daughter [name] [phone]   LOS: 26 days    Candace [name] , [facility] Surgery 09/27/2020, 8:50 AM Please see Amion for pager number during day hours 7:00am-4:30pm or 7:00am -11:30am on weekends

## 2020-09-28 ENCOUNTER — Inpatient Hospital Stay (HOSPITAL_COMMUNITY): Payer: Medicare Other

## 2020-09-28 DIAGNOSIS — K265 Chronic or unspecified duodenal ulcer with perforation: Secondary | ICD-10-CM | POA: Diagnosis not present

## 2020-09-28 DIAGNOSIS — N179 Acute kidney failure, unspecified: Secondary | ICD-10-CM | POA: Diagnosis not present

## 2020-09-28 DIAGNOSIS — I4891 Unspecified atrial fibrillation: Secondary | ICD-10-CM | POA: Diagnosis not present

## 2020-09-28 DIAGNOSIS — J9601 Acute respiratory failure with hypoxia: Secondary | ICD-10-CM | POA: Diagnosis not present

## 2020-09-28 DIAGNOSIS — J9602 Acute respiratory failure with hypercapnia: Secondary | ICD-10-CM | POA: Diagnosis not present

## 2020-09-28 DIAGNOSIS — J80 Acute respiratory distress syndrome: Secondary | ICD-10-CM | POA: Diagnosis not present

## 2020-09-28 DIAGNOSIS — U071 COVID-19: Secondary | ICD-10-CM | POA: Diagnosis not present

## 2020-09-28 LAB — RENAL FUNCTION PANEL
Albumin: 1.6 g/dL — ABNORMAL LOW (ref 3.5–5.0)
Albumin: 1.5 g/dL — ABNORMAL LOW (ref 3.5–5.0)
Anion gap: 5 (ref 5–15)
Anion gap: 12 (ref 5–15)
BUN: 23 mg/dL (ref 8–23)
BUN: 28 mg/dL — ABNORMAL HIGH (ref 8–23)
CO2: 24 mmol/L (ref 22–32)
CO2: 22 mmol/L (ref 22–32)
Calcium: 8.9 mg/dL (ref 8.9–10.3)
Calcium: 9.4 mg/dL (ref 8.9–10.3)
Chloride: 101 mmol/L (ref 98–111)
Chloride: 97 mmol/L — ABNORMAL LOW (ref 98–111)
Creatinine, Ser: 0.38 mg/dL — ABNORMAL LOW (ref 0.44–1.00)
Creatinine, Ser: 0.55 mg/dL (ref 0.44–1.00)
GFR, Estimated: >60 mL/min (ref >60)
GFR, Estimated: >60 mL/min (ref >60)
Glucose, Bld: 85 mg/dL (ref 70–99)
Glucose, Bld: 311 mg/dL — ABNORMAL HIGH (ref 70–99)
Phosphorus: 2.2 mg/dL — ABNORMAL LOW (ref 2.5–4.6)
Phosphorus: 4.9 mg/dL — ABNORMAL HIGH (ref 2.5–4.6)
Potassium: 3.9 mmol/L (ref 3.5–5.1)
Potassium: 4.8 mmol/L (ref 3.5–5.1)
Sodium: 130 mmol/L — ABNORMAL LOW (ref 135–145)
Sodium: 131 mmol/L — ABNORMAL LOW (ref 135–145)

## 2020-09-28 LAB — POCT I-STAT 7, (LYTES, BLD GAS, ICA,H+H)
Acid-Base Excess: 0 mmol/L (ref 0.0–2.0)
Acid-base deficit: 2 mmol/L (ref 0.0–2.0)
Bicarbonate: 23.7 mmol/L (ref 20.0–28.0)
Bicarbonate: 25.2 mmol/L (ref 20.0–28.0)
Calcium, Ion: 1.39 mmol/L (ref 1.15–1.40)
Calcium, Ion: 1.37 mmol/L (ref 1.15–1.40)
HCT: 24 % — ABNORMAL LOW (ref 36.0–46.0)
HCT: 19 % — ABNORMAL LOW (ref 36.0–46.0)
Hemoglobin: 8.2 g/dL — ABNORMAL LOW (ref 12.0–15.0)
Hemoglobin: 6.5 g/dL — CL (ref 12.0–15.0)
O2 Saturation: 84 %
O2 Saturation: 100 %
Patient temperature: 93
Potassium: 5.1 mmol/L (ref 3.5–5.1)
Potassium: 4.1 mmol/L (ref 3.5–5.1)
Sodium: 132 mmol/L — ABNORMAL LOW (ref 135–145)
Sodium: 136 mmol/L (ref 135–145)
TCO2: 25 mmol/L (ref 22–32)
TCO2: 26 mmol/L (ref 22–32)
pCO2 arterial: 43.3 mmHg (ref 32.0–48.0)
pCO2 arterial: 38.5 mmHg (ref 32.0–48.0)
pH, Arterial: 7.347 — ABNORMAL LOW (ref 7.350–7.450)
pH, Arterial: 7.409 (ref 7.350–7.450)
pO2, Arterial: 51 mmHg — ABNORMAL LOW (ref 83.0–108.0)
pO2, Arterial: 194 mmHg — ABNORMAL HIGH (ref 83.0–108.0)

## 2020-09-28 LAB — BASIC METABOLIC PANEL
Anion gap: 8 (ref 5–15)
BUN: 23 mg/dL (ref 8–23)
CO2: 23 mmol/L (ref 22–32)
Calcium: 8.8 mg/dL — ABNORMAL LOW (ref 8.9–10.3)
Chloride: 103 mmol/L (ref 98–111)
Creatinine, Ser: 0.37 mg/dL — ABNORMAL LOW (ref 0.44–1.00)
GFR, Estimated: >60 mL/min (ref >60)
Glucose, Bld: 157 mg/dL — ABNORMAL HIGH (ref 70–99)
Potassium: 4.1 mmol/L (ref 3.5–5.1)
Sodium: 134 mmol/L — ABNORMAL LOW (ref 135–145)

## 2020-09-28 LAB — GLUCOSE, CAPILLARY
Glucose-Capillary: 100 mg/dL — ABNORMAL HIGH (ref 70–99)
Glucose-Capillary: 69 mg/dL — ABNORMAL LOW (ref 70–99)
Glucose-Capillary: 97 mg/dL (ref 70–99)
Glucose-Capillary: 184 mg/dL — ABNORMAL HIGH (ref 70–99)
Glucose-Capillary: 276 mg/dL — ABNORMAL HIGH (ref 70–99)
Glucose-Capillary: 181 mg/dL — ABNORMAL HIGH (ref 70–99)

## 2020-09-28 LAB — CBC
HCT: 25.6 % — ABNORMAL LOW (ref 36.0–46.0)
HCT: 22.2 % — ABNORMAL LOW (ref 36.0–46.0)
Hemoglobin: 7.7 g/dL — ABNORMAL LOW (ref 12.0–15.0)
Hemoglobin: 6.8 g/dL — CL (ref 12.0–15.0)
MCH: 27.9 pg (ref 26.0–34.0)
MCH: 28 pg (ref 26.0–34.0)
MCHC: 30.1 g/dL (ref 30.0–36.0)
MCHC: 30.6 g/dL (ref 30.0–36.0)
MCV: 92.8 fL (ref 80.0–100.0)
MCV: 91.4 fL (ref 80.0–100.0)
Platelets: 96 10*3/uL — ABNORMAL LOW (ref 150–400)
Platelets: 61 10*3/uL — ABNORMAL LOW (ref 150–400)
RBC: 2.76 MIL/uL — ABNORMAL LOW (ref 3.87–5.11)
RBC: 2.43 MIL/uL — ABNORMAL LOW (ref 3.87–5.11)
RDW: 20.2 % — ABNORMAL HIGH (ref 11.5–15.5)
RDW: 19.6 % — ABNORMAL HIGH (ref 11.5–15.5)
WBC: 12.2 10*3/uL — ABNORMAL HIGH (ref 4.0–10.5)
WBC: 8.7 10*3/uL (ref 4.0–10.5)
nRBC: 8.3 % — ABNORMAL HIGH (ref 0.0–0.2)
nRBC: 5.3 % — ABNORMAL HIGH (ref 0.0–0.2)

## 2020-09-28 LAB — MAGNESIUM
Magnesium: 2.2 mg/dL (ref 1.7–2.4)
Magnesium: 2.1 mg/dL (ref 1.7–2.4)

## 2020-09-28 MED ORDER — SODIUM CHLORIDE 0.9 % IV BOLUS
1000.0000 mL | Freq: Once | INTRAVENOUS | Status: AC
  Administered 2020-09-28: 1000 mL via INTRAVENOUS

## 2020-09-28 MED ORDER — MIDODRINE HCL 5 MG PO TABS
10.0000 mg | ORAL_TABLET | Freq: Three times a day (TID) | ORAL | Status: DC
  Administered 2020-09-28 – 2020-10-03 (×17): 10 mg
  Filled 2020-09-28 (×17): qty 2

## 2020-09-28 MED ORDER — POTASSIUM PHOSPHATES 15 MMOLE/5ML IV SOLN
20.0000 mmol | Freq: Once | INTRAVENOUS | Status: AC
  Administered 2020-09-28: 20 mmol via INTRAVENOUS
  Filled 2020-09-28: qty 6.67

## 2020-09-28 MED ORDER — INSULIN GLARGINE 100 UNIT/ML ~~LOC~~ SOLN
20.0000 [IU] | Freq: Every day | SUBCUTANEOUS | Status: DC
  Administered 2020-09-29 – 2020-09-30 (×2): 20 [IU] via SUBCUTANEOUS
  Filled 2020-09-28 (×4): qty 0.2

## 2020-09-28 MED ORDER — SODIUM CHLORIDE 0.9 % IV SOLN: INTRAVENOUS | Status: DC | PRN

## 2020-09-28 MED ORDER — PRISMASOL BGK 4/2.5 32-4-2.5 MEQ/L REPLACEMENT SOLN
Status: DC
  Filled 2020-09-28 (×6): qty 5000

## 2020-09-28 MED ORDER — QUETIAPINE FUMARATE 100 MG PO TABS
100.0000 mg | ORAL_TABLET | Freq: Two times a day (BID) | ORAL | Status: DC
  Administered 2020-09-28 – 2020-10-26 (×58): 100 mg
  Filled 2020-09-28 (×58): qty 1

## 2020-09-28 NOTE — Progress Notes (Signed)
eLink Physician-Brief Progress Note Patient Name: Candace [name] DOB: 1951/08/05 MRN: [id number]   Date of Service  09/28/2020  HPI/Events of Note  Review of EKG reveals Atrial fibrillation with rapid ventricular response ST & T wave abnormality, consider lateral ischemia. Demand ischemia.   eICU Interventions  Continue present management.      Intervention Category Major Interventions: Arrhythmia - evaluation and management  Ramses Klecka Eugene 09/28/2020, 10:32 PM

## 2020-09-28 NOTE — Progress Notes (Signed)
Assisted tele visit to patient with family member.  Airen Dales Anderson, RN   

## 2020-09-28 NOTE — Procedures (Signed)
Arterial Catheter Insertion Procedure Note  Candace [name]  [id number]  12-29-1950  Date:09/28/20  Time:3:25 PM    Provider Performing: Vilinda Blanks    Procedure: Insertion of Arterial Line [phone]) without US guidance  Indication(s) Blood pressure monitoring and/or need for frequent ABGs  Consent Unable to obtain consent due to emergent nature of procedure.  Anesthesia None   Time Out Verified patient identification, verified procedure, site/side was marked, verified correct patient position, special equipment/implants available, medications/allergies/relevant history reviewed, required imaging and test results available.   Sterile Technique Maximal sterile technique including full sterile barrier drape, hand hygiene, sterile gown, sterile gloves, mask, hair covering, sterile ultrasound probe cover (if used).   Procedure Description Area of catheter insertion was cleaned with chlorhexidine and draped in sterile fashion. Without real-time ultrasound guidance an arterial catheter was placed into the left radial artery.  Appropriate arterial tracings confirmed on monitor.     Complications/Tolerance None; patient tolerated the procedure well.   EBL Minimal   Specimen(s) None

## 2020-09-28 NOTE — Progress Notes (Signed)
Nutrition Follow-up  DOCUMENTATION CODES:   Obesity unspecified  INTERVENTION:   Tube Feeding via Cortrak: Vital 1.5 at 60 ml/hr Pro-source TF 90 mL TID Provides 163 g of protein, 2400 kcals and 1094 mL of free water  Continue B-complex with C  Check copper, Vit C  NUTRITION DIAGNOSIS:   Inadequate oral intake related to inability to eat as evidenced by NPO status.  Being addressed via TF  GOAL:   Patient will meet greater than or equal to 90% of their needs  Progressing  MONITOR:   Vent status, TF tolerance, Labs, Weight trends  REASON FOR ASSESSMENT:   Consult New TPN/TNA  ASSESSMENT:   69 y.o. female was recently hospitalized for COVID-19 pneumonia-and discharged on 2-3 L of home O2-presents with worsening shortness of breath-found to have A. fib with RVR, worsening hypoxia. Pt developed severe upper abdominal pain-subsequent further imaging studies revealed perforated viscus. S/p laparotomy on 11/3 which showed a perforated duodenal ulcer.   11/03Exploratory laparotomy, adhesiolysis, modified graham patch repair of perforated duodenal ulcer 11/05 TPN initiated 11/08 UGI negative for leak 11/09 Acute decline in respiratory and mental status requiring intubation 11/10 Started on trickle TF, CT abdomen negative for postop complication 61/60 TPN discontinued due to elevated LFTS, TGs; TF increased to 30 ml/hr, Started CRRT 11/16 Extubated, Re-intubated 11/23 Trach placed, CRRT d/c 11/24 CRRT restarted due to pressors, hyperkalemia 11/26 Hemorrhagic shock, Hgb 5.6  Pt bradycardic overnight, increasing pressor requirements, Received 1 unit PRBC  Pt remains on CRRT, UF goal of 50 ml/hr as tolerated. Having difficulty removing fluid given shock  TF changed to Nepro at 65 ml/hr on 11/24 due to hyperkalemia when CRRT briefly stopped. Hyperkalemia has resolved; phosphorus is low requiring supplementation.   Per MD notes, pt ultimately needs PEG to progress her  care but surgery not recommending PEG placement at this time   Pt at increased risk for vitamin deficiencies given prolonged CRRT (initiation on 11/10); plan to check Copper, Vit C. Further recommendations to follow  Labs: sodium 130 (L), phosphorus 2.2 (L) Meds: B-complex with C, ss novolog, novolog q 4 hours, lantus    Diet Order:   Diet Order            Diet NPO time specified  Diet effective midnight                 EDUCATION NEEDS:   Not appropriate for education at this time  Skin:  Skin Assessment: Skin Integrity Issues: Skin Integrity Issues:: DTI DTI: sacrum Stage I: n/a Incisions: abdomen  Last BM:  12/1 ileostomy  Height:   Ht Readings from Last 1 Encounters:  09/20/20 5\' 8"  (1.727 m)    Weight:   Wt Readings from Last 1 Encounters:  09/29/20 84.5 kg    Ideal Body Weight:     BMI:  Body mass index is 28.33 kg/m.  Estimated Nutritional Needs:   Kcal:  7371-0626 kcals  Protein:  135-175 g  Fluid:  >/= 2 L/day   Kerman Passey MS, RDN, LDN, CNSC Registered Dietitian III Clinical Nutrition RD Pager and On-Call Pager Number Located in [place]

## 2020-09-28 NOTE — Progress Notes (Signed)
Patient ID: Candace [name], female   DOB: 1951-01-13, 69 y.o.   MRN: [id number] S: had respiratory distress last night with secretions and bradycardia which resolved with suctioning.  Remains on pressors. O:BP (!) 59/41   Pulse (!) 106   Temp (!) 96 F (35.6 C) (Axillary)   Resp 19   Ht 5' 8"  (1.727 m)   Wt 84.1 kg   SpO2 90%   BMI 28.19 kg/m   Intake/Output Summary (Last 24 hours) at 09/28/2020 1122 Last data filed at 09/28/2020 1100 Gross per 24 hour  Intake 3283.99 ml  Output 3697 ml  Net -413.01 ml   Intake/Output: I/O last 3 completed shifts: In: 4854 [I.V.:1809.8; NG/GT:2560; IV Piggyback:887.2] Out: 6270 [Other:5461; Stool:605]  Intake/Output this shift:  Total I/O In: 431.2 [I.V.:96.2; NG/GT:335] Out: 465 [Other:465] Weight change: -0.9 kg Gen: on vent via trach CVS: tachy at 106, no rub Resp: occ rhonchi Abd: +BS, soft, NT/ND Ext: +1 edema of upper ext, trace lower ext edema  Recent Labs  Lab 09/25/20 0416 09/25/20 0416 09/25/20 1647 09/26/20 0304 09/26/20 1604 09/26/20 1843 09/27/20 0427 09/27/20 1607 09/28/20 0429  NA 134*   < > 135 135 134* 135 134* 130* 130*  K 3.7   < > 4.3 4.0 4.6 4.8 4.5 5.9* 3.9  CL 102  --  102 101 101  --  100 97* 101  CO2 24  --  23 26 24   --  25 23 24   GLUCOSE 106*  --  171* 145* 159*  --  118* 336* 85  BUN 30*  --  31* 25* 27*  --  24* 24* 23  CREATININE 0.53  --  0.57 0.51 0.48  --  0.45 0.54 0.38*  ALBUMIN 1.7*  --  1.5* 1.5* 1.4*  --  2.0* 1.6* 1.6*  CALCIUM 9.0  --  8.1* 8.5* 8.0*  --  8.8* 8.4* 8.9  PHOS 2.4*  --  2.9 2.0* 3.7  --  2.3* 6.9* 2.2*   < > = values in this interval not displayed.   Liver Function Tests: Recent Labs  Lab 09/27/20 0427 09/27/20 1607 09/28/20 0429  ALBUMIN 2.0* 1.6* 1.6*   No results for input(s): LIPASE, AMYLASE in the last 168 hours. No results for input(s): AMMONIA in the last 168 hours. CBC: Recent Labs  Lab 09/23/20 0303 09/24/20 0443 09/25/20 0416 09/25/20 0416  09/25/20 1755 09/25/20 1755 09/26/20 0304 09/26/20 0304 09/26/20 1843 09/27/20 0427 09/28/20 0428  WBC 18.7*   < > 11.1*   < > 10.4   < > 12.3*  --   --  10.8* 12.2*  NEUTROABS 17.5*  --   --   --   --   --   --   --   --  9.9*  --   HGB 7.7*   < > 9.7*   < > 9.6*   < > 9.6*   < > 9.9* 8.0* 7.7*  HCT 25.6*   < > 30.1*   < > 30.7*   < > 30.9*   < > 29.0* 25.9* 25.6*  MCV 89.5   < > 88.5  --  89.0  --  90.4  --   --  92.5 92.8  PLT 79*   < > 89*   < > 91*   < > 73*  --   --  77* 96*   < > = values in this interval not displayed.   Cardiac Enzymes: No results for input(s):  CKTOTAL, CKMB, CKMBINDEX, TROPONINI in the last 168 hours. CBG: Recent Labs  Lab 09/27/20 1517 09/27/20 1939 09/27/20 2334 09/28/20 0314 09/28/20 0714  GLUCAP 155* 143* 134* 100* 69*    Iron Studies: No results for input(s): IRON, TIBC, TRANSFERRIN, FERRITIN in the last 72 hours. Studies/Results: DG Chest Port 1 View  Result Date: 09/28/2020 CLINICAL DATA:  Acute on chronic respiratory failure EXAM: PORTABLE CHEST 1 VIEW COMPARISON:  09/25/2020 FINDINGS: Tracheostomy and nasoenteric feeding tube extending beyond the margin of the examination into the upper abdomen, left subclavian large-bore central venous catheter with its tip within the deep right atrium, and right upper extremity PICC line with its tip within the superior vena cava are unchanged. Lung volumes are small and pulmonary insufflation has slightly decreased in the interval. Bibasilar consolidation is again identified. Patchy infiltrate within the right mid lung zone is unchanged. Possible tiny left pleural effusion. No pneumothorax. Cardiac size within normal limits. IMPRESSION: Slight interval decrease in pulmonary insufflation. Low lung volumes. Stable bibasilar pulmonary consolidation. Stable support lines and tubes. Electronically Signed   By: Fidela Salisbury MD   On: 09/28/2020 05:06   . B-complex with vitamin C  1 tablet Per Tube Daily  .  chlorhexidine gluconate (MEDLINE KIT)  15 mL Mouth Rinse BID  . Chlorhexidine Gluconate Cloth  6 each Topical Daily  . chlorpheniramine-HYDROcodone  5 mL Per Tube Q12H  . darbepoetin (ARANESP) injection - DIALYSIS  60 mcg Subcutaneous Q Tue-1800  . feeding supplement (PROSource TF)  45 mL Per Tube BID  . insulin aspart  0-20 Units Subcutaneous Q4H  . insulin aspart  8 Units Subcutaneous Q4H  . insulin glargine  20 Units Subcutaneous Daily  . mouth rinse  15 mL Mouth Rinse QID  . midodrine  10 mg Per Tube TID WC  . oxyCODONE  10 mg Per Tube Q6H  . pantoprazole sodium  40 mg Per Tube BID  . QUEtiapine  100 mg Per Tube BID  . sodium chloride flush  10-40 mL Intracatheter Q12H    BMET    Component Value Date/Time   NA 130 (L) 09/28/2020 0429   K 3.9 09/28/2020 0429   CL 101 09/28/2020 0429   CO2 24 09/28/2020 0429   GLUCOSE 85 09/28/2020 0429   BUN 23 09/28/2020 0429   CREATININE 0.38 (L) 09/28/2020 0429   CALCIUM 8.9 09/28/2020 0429   GFRNONAA >60 09/28/2020 0429   CBC    Component Value Date/Time   WBC 12.2 (H) 09/28/2020 0428   RBC 2.76 (L) 09/28/2020 0428   HGB 7.7 (L) 09/28/2020 0428   HCT 25.6 (L) 09/28/2020 0428   PLT 96 (L) 09/28/2020 0428   MCV 92.8 09/28/2020 0428   MCH 27.9 09/28/2020 0428   MCHC 30.1 09/28/2020 0428   RDW 20.2 (H) 09/28/2020 0428   LYMPHSABS 0.3 (L) 09/27/2020 0427   MONOABS 0.1 09/27/2020 0427   EOSABS 0.0 09/27/2020 0427   BASOSABS 0.0 09/27/2020 0427     Assessment/Plan:  1. AKI- oliguric presumably due to ischemic ATN in setting of hemorrhagic shock.  Started on CRRT 09/08/20 due to persistent oliguria and hyperkalemia.   1. All fluids 4K/2.5Ca:  Pre-filter 400 ml/hr, post-filter 300 ml/hr, dialysate 1,500 ml/hr 2. UF goal 50 ml/hr as bp tolerates 3. No anticoagulation due to thrombocytopenia and ongoing GI blood loss. 4. Continue with current CRRT settings.  2. Hemorrhagic shock- ongoing blood loss and requiring  pressors. 1. Transfuse as needed 3. Septic shock due  to MRSA pneumonia. 4. Perforated duodenal ulcer- s/p exploratory lap and Graham patch placement per surgery. 5. Acute hypoxic/hypercapnic respiratory failure due to ARDS from Recent Covid-19 PNA- currently on vent via trach per PCCM.   6. Thrombocytopenia- follow 7. P. Atrial fibrillation- no anticoagulation due to ongoing GI bleed. 8. Vascular access- RIJ trialysis catheter 09/08/20 then removed. Left subclavian trialysis catheter placed 09/15/20 and will need new access this week.  Hopefully can have [facility] given ongoing anuric renal failure (unless family wishes to continue with current level of care). 9. Disposition- poor overall prognosis and will likely require LTC and chronic vent support.  PCCM to discuss goals of care with family.  Donetta Potts, MD [employer] [phone]

## 2020-09-28 NOTE — Progress Notes (Signed)
eLink Physician-Brief Progress Note Patient Name: Thelma Lorenzetti DOB: 1951-09-25 MRN: [id number]   Date of Service  09/28/2020  HPI/Events of Note  Hypotension - BP = 92/39  with MAP = 55 by A-line.  eICU Interventions  Plan: 1. Increase ceiling on Norepinephrine IV infusion to 40 mcg/min. Titrate to MAP >= 60.  2. Monitor CVP now and Q 4 hours.      Intervention Category Major Interventions: Hypotension - evaluation and management  Caysen Whang [place] 09/28/2020, 7:22 PM

## 2020-09-28 NOTE — Progress Notes (Signed)
Palliative Medicine RN Note: Rec'd call from daughter Candace [name], who expressed frustration that surgery PA Claiborne Billings discussed Candace [name] with pt yesterday. Candace [name] feels that they have been clear that [place] are full scope/full code and that no further discussion is warranted.   Discussed with Candace Signs, NP; she will see the pt.  Candace Skiff [name], RN, BSN, [facility] Palliative Medicine Team 09/28/2020 3:56 PM Office [phone]

## 2020-09-28 NOTE — Progress Notes (Addendum)
eLink Physician-Brief Progress Note Patient Name: Mylisa Brunson DOB: November 27, 1950 MRN: [id number]   Date of Service  09/28/2020  HPI/Events of Note  Another episode of bradycardia into the 20's. Related to cough. Vagal? Given another 1/2 amp atropine IV and went into AFIB with a ventricular rate = 120-132. Patient is also on a Norepinephrine IV infusion. Last K+ = 4.8. Last Mg++ = 2.2.Review of medications reveals no B-Blockers, Ca++ Channel agents or Digoxin. Reluctant to put on Amiodarone for AFIB with episodes of Bradycardia.   eICU Interventions  Plan: 1. K+, Mg++  and ABG STAT. 2. 12 Lead EKG now.  3. Defer restarting Heparin IV infusion d/t GI bleeding to rounding team.      Intervention Category Major Interventions: Arrhythmia - evaluation and management  Caron Ode [place] 09/28/2020, 9:43 PM

## 2020-09-28 NOTE — Progress Notes (Addendum)
NAMEElanora [name], MRN:  [id number], DOB:  Nov 22, 1950, LOS: 15 ADMISSION DATE:  08/31/2020, CONSULTATION DATE:  09/07/2020 REFERRING MD:  Dr Candiss Norse, CHIEF COMPLAINT:  Acute resp failure  Brief History   69 year old female who was previously diagnosed with Covid 08/23/2020.  Admitted 11/2 with AF-RVR, found to have a perforated duodenal underwent exploratory laparotomy with Phillip Heal patch placement.  Past Medical History  Covid pneumonia Atrial fibrillation CKD stage III Diabetes mellitus Hypertension Colon cancer Hyperlipidemia  Significant Hospital Events   11/2 Admitted  11/3 OR -> perforated duodenal ulcer 11/10 progressive hemorrhagic shock, intubated, transfused, pressors, proned; started on CRRT in PM 11/16 Extubated. Re-intubated overnight due to respiratory distress and hypoxia with decreased mentation 11/18  bronch'd/ cultures sent 11/19 hgb down getting blood 09/22/2020 spiked fever resume empirical antimicrobial therapy 11/26: hemorrhagic shock, hgb 5.6, increased pressors, CT A/P   Consults:  Cardiology [facility] Nephrology   Procedures:  11/3 Exploratory laparotomy, Phillip Heal patch, lysis of adhesion for duodenal ulceration postop day 6  R PICC 11/5 >> A line 11/9 >> out ETT 11/9 > 11/16, 11/16 >> 09/21/2020 09/21/2020 tracheostomy>> Lt [place] CVL 11/9 >> R IJ trialysis >> out  Significant Diagnostic Tests:  11/3 CT abd/ pelvis > 1. Positive for bowel perforation: Pneumoperitoneum and intermediate density free fluid in the abdomen. Prior total colectomy. The specific site of perforation is unclear-oral contrast present to the proximal jejunum has not obviously leaked. Note that there may be small bowel loops adherent to the ventral abdominal wall along the greater curve of the stomach. 2. Extensive bilateral lower lung pneumonia. No pleural effusion. 3. Other abdominal and pelvic viscera are stable since 2015, including bilateral adrenal adenomas. Chronic renal  parapelvic cysts. 4. Aortic Atherosclerosis 11/3 TTE > EF 70-75%, RV not well visualized, mildly reduced RV systolic function 95/62 CT chest/ abd/ pelvis> 1. Interval progression of diffuse bilateral hazy ground-glass airspace opacities with more focal areas of consolidation at the lung bases 2. Trace bilateral pleural effusions. 3. Postsurgical changes the abdomen as detailed above. No evidence for a postoperative abscess, however evaluation is limited by lack of IV contrast. 4. There is a 1.9 cm cystic appearing lesion located in the pancreatic body. This was not present on the patient's CT from 2015.  Follow-up with an outpatient contrast enhanced MRI is recommended. 5. The endometrial stripe appears diffusely thickened. Follow-up with pelvic ultrasound is recommended. Aortic Atherosclerosis 11/14 LE doppler studies > + DVT of right posterior tibial and peroneal vein, +dVT of left posterior tibial vein  11/26 CT ABD PEL > liver unremarkable, distended gallbladder with layering tiny gallstones versus sludge, no duct dilatation, mild hyperdensity right upper pole renal collecting system new.  No evidence of retroperitoneal bleeding.  Multifocal lower lobe predominant pulmonary infiltrates/pneumonia.  Small left pleural effusion.  Micro Data:  11/10 MRSA PCR > neg 11/10 BC x 2 > neg 09/22/2020 blood cultures x2>> S epi.  09/22/2020 sputum culture>> MRSA  Antimicrobials:  azithro 11/2 >11/3 Ceftriaxone 11/2  Fluconazole 11/3 Zosyn 11/3 >> 11/7 Vanc 11/10 off Cefepime 11/10 > 11/16  09/22/2020 vancomycin for MRSA PNA >>    Subjective:   Fentanyl was increased to 200 over last 24 hours. Experienced a coughing episode and associated bradycardic event overnight Norepinephrine 16 Remains on CVVH, more removal 0-50 cc/h Did not do pressure support ventilation 11/29, currently on PSV 12  Objective   Blood pressure (!) 103/58, pulse 93, temperature (!) 96 F (35.6 C), temperature source  Axillary,  resp. rate (!) 23, height 5\' 8"  (1.727 m), weight 84.1 kg, SpO2 96 %.    Vent Mode: PSV;CPAP FiO2 (%):  [40 %-50 %] 40 % Set Rate:  [26 bmp] 26 bmp Vt Set:  [510 mL] 510 mL PEEP:  [5 cmH20] 5 cmH20 Pressure Support:  [12 cmH20] 12 cmH20 Plateau Pressure:  [28 cmH20-29 cmH20] 29 cmH20   Intake/Output Summary (Last 24 hours) at 09/28/2020 0913 Last data filed at 09/28/2020 0900 Gross per 24 hour  Intake 3379.89 ml  Output 3814 ml  Net -434.11 ml   Filed Weights   09/26/20 0430 09/27/20 0424 09/28/20 0420  Weight: 84.5 kg 85 kg 84.1 kg    Physical Exam: General: Elderly acutely chronically ill-appearing woman, ventilated, no distress HEENT: Trach in place, stoma clean and dry, ventilated, oropharynx otherwise clear, pupils equal Neuro: Eyes open, tracks, wakes to voice easily, nods to questions follows commands. Globally weak CV: Distant, regular, no murmur PULM: Coarse bilateral breath sounds GI: Nondistended, ileostomy present brown stool, positive bowel sounds GU: Foley catheter in place Extremities: Widespread edema, upper extremities greater than lower extremities Skin: No rash  Assessment & Plan:   Hemorrhagic shock  Acute blood loss anemia  New Drop in hgb 5.6 on 11/26 >> 8.0 on 11/19 Plan  -Continue to follow CBC and for any overt signs of blood loss. Stable 11/30  Sedation needs, anxiety. Persistent need fentanyl infusion. Respiratory dyssynchrony, cough with decreased -Scheduled oxycodone every 6 hours -tussionex for cough suppression -Consider adding fentanyl patch to allow weaning of fentanyl infusion. -Add Seroquel on 11/30 -attempt to aggressively wean fent gtt  Acute hypoxic/hypercapnic respiratory failure due to ARDS from COVID-19 pneumonia c/b HCAP and failure to wean and failed extubation 11/16 Plan -Unclear whether she will be able to wean fully for mechanical ventilation. Does tolerate some high pressure support. Suspect that this would be  a long-term wean or possibly permanent vent dependence. Family considering going home with a home ventilator. Other options include LTAC or vent SNF. Not entirely clear whether this consistent with the patient's overall wishes. Appreciate palliative care input here.  Multifactorial shock, previously hemorrhagic as above, Now Septic shock, MRSA pneumonia 1/4 bottles BCX with Meth R Staph Epi, likely contaminant Plan -Continue vancomycin, day 7 on 11/30 -Midodrine started 11/30  Anuric AKI on CRRT Plan -Appreciate nephrology management of her CVVHD -Have been able to make transition to intermittent HD due to shock state. Question whether we may be able to take a break from CVVHD and see if blood pressure rebounds, norepinephrine is weaned with a goal to off.  Duodenal ulcer perforation status post ex lap and Graham's patch Plan -Appreciate CCS assistance in management -Ultimately would need a PEG tube to progress her care. Unable to do so currently given the associated risk of gastric insufflation that would be necessary for tube placement.  Paroxysmal A. fib, currently in sinus rhythm Plan -Continue telemetry monitoring -Have not restarted anticoagulation given recurrent GI bleeding this admission  Thrombocytopenia Plan -Continue to follow CBC  Glucose control Plan -Sliding-scale insulin, change Levemir to Lantus as she has been having some hypoglycemic episodes overnight  1.9 cm cystic appearing lesion located in the pancreatic body Plan -No intervention at this time, plan outpatient follow-up  History of stage IV colon cancer Status post pulmonary metastatectomies at [facility] + Chemo -No intervention at this time  Indeterminate bilateral DVT Patient had bilateral lower extremity Dopplers done, suggestive of possible indeterminate DVT, spoke with IR for  IVC filter placement, after reevaluating ultrasound of lower extremities it was suggested patient does not have femoral/popliteal  DVTs, so IVC filter was not placed Plan: -Currently following off anticoagulation  Best practice:  Diet: Tube feeds Pain/Anxiety/Delirium protocol (if indicated) fentanyl drip, oxycodone per tube VAP protocol (if indicated): yes DVT prophylaxis: SCDs GI prophylaxis: Protonix Glucose control: lantus + ssi  Mobility: Bedrest Code Status: Full Family Communication: Discussed her status, prognosis, current medical goals with daughters on 11/29 Disposition: ICU   This patient is critically ill with multiple organ system failure; which, requires frequent high complexity decision making, assessment, support, evaluation, and titration of therapies. This was completed through the application of advanced monitoring technologies and extensive interpretation of multiple databases. During this encounter critical care time was devoted to patient care services described in this note for 32 minutes.   Baltazar Apo, MD, PhD 09/28/2020, 9:13 AM [facility] [phone] or if no answer [phone]

## 2020-09-28 NOTE — Progress Notes (Addendum)
eLink Physician-Brief Progress Note Patient Name: Candace [name] DOB: Jan 09, 1951 MRN: [id number]   Date of Service  09/28/2020  HPI/Events of Note  Episode of bradycardia into the 30's. Atropine 1/2 amp IV given. HR now = 110. BP = 134/62. Sat = 98%. CVP = 3.   eICU Interventions  Plan: 1. Bolus with 0.9 NaCl 1 liter IV over 1 hour now.      Intervention Category Major Interventions: Arrhythmia - evaluation and management  Jadavion Spoelstra [place] 09/28/2020, 8:56 PM

## 2020-09-28 NOTE — Progress Notes (Signed)
   09/28/20 1957 09/28/20 1958 09/28/20 1959  Vitals  Pulse Rate 90 64  --   ECG Heart Rate 89 (!) 44 (!) 35  Resp (!) 23 (!) 25 (!) 25  Oxygen Therapy  SpO2 100 % 95 %  --     09/28/20 2000  Vitals  Pulse Rate (!) 105  ECG Heart Rate (!) 105  Resp (!) 28  Oxygen Therapy  SpO2 100 %    Bradycardic event observed on monitor. Upon assessment pt tachypneic and coughing with a HR of 32. Atropine administered and fentanyl gtt restarted. [place] notified.

## 2020-09-28 NOTE — Progress Notes (Signed)
Pt placed back on full vent support due to increased WOB and sats dropping into the 80's. FIO2 increased to 100%. Sats increased to 98%. RN at bedside.

## 2020-09-28 NOTE — Progress Notes (Signed)
Daily Progress Note   Patient Name: Candace [name]       Date: 09/28/2020 DOB: 1951/03/11  Age: 69 y.o. MRN#: [id number] Attending Physician: Collene Gobble, MD Primary Care Physician: Algis Greenhouse, MD Admit Date: 08/31/2020  Reason for Follow-up: continued GOC discussion and psychosocial support  Subjective: Chart reviewed and received report from the primary RN. Patient remains on the vent and on CRRT. She did wean today for approximately 3 hours, then had to be placed back on full support due to increased work of breathing and decreased spo2 in the 80's. She remains on levophed at 10 mcg/hr - did not tolerate attempting to wean this down earlier today.  While I am on the unit, BP reading is 72/49, levophed is turned up to 15 mcg/hr.   I spoke with daughter Candace [name] by phone and provided a brief update on patient's current medical status. All questions were answered. Candace [name] expresses frustration that surgery PA discussed [place] yesterday with her mother.  We discussed that many of the interventions done to support a critically ill patient are uncomfortable, and medical professionals sometimes need to confirm that these interventions are truly desired by the patient. Candace [name] verbalizes understanding, but is adamant that her mother not be asked these questions again. Candace [name] states that her mother previously expressed (earlier during this hospitalization) that her wishes were to "fight", and the family intends to honor that. I gently discussed that in the midst of a critical illness, goals of care can change. Discussed that her mother's medical situation is one that no one ever anticipates or can fully prepare for.  Candace [name] feels that goals of care are clear and there is no need to discuss with her mother  at this point. I provided reassurance that I would document this in the EMR.   Candace [name] expresses understanding that patient is unfortunately very fragile despite ongoing intensive medical treatment and full mechanical support. She indicates that the family wants to continue with all current interventions despite potential outcomes.   Length of Stay: 27  Current Medications: Scheduled Meds:  . B-complex with vitamin C  1 tablet Per Tube Daily  . chlorhexidine gluconate (MEDLINE KIT)  15 mL Mouth Rinse BID  . Chlorhexidine Gluconate Cloth  6 each Topical Daily  . chlorpheniramine-HYDROcodone  5 mL Per Tube Q12H  .  darbepoetin (ARANESP) injection - DIALYSIS  60 mcg Subcutaneous Q Tue-1800  . feeding supplement (PROSource TF)  45 mL Per Tube BID  . insulin aspart  0-20 Units Subcutaneous Q4H  . insulin aspart  8 Units Subcutaneous Q4H  . insulin glargine  20 Units Subcutaneous Daily  . mouth rinse  15 mL Mouth Rinse QID  . midodrine  10 mg Per Tube TID WC  . oxyCODONE  10 mg Per Tube Q6H  . pantoprazole sodium  40 mg Per Tube BID  . QUEtiapine  100 mg Per Tube BID  . sodium chloride flush  10-40 mL Intracatheter Q12H    Continuous Infusions: .  prismasol BGK 4/2.5 400 mL/hr at 09/28/20 0048  .  prismasol BGK 4/2.5 300 mL/hr at 09/28/20 0604  . sodium chloride Stopped (09/28/20 1111)  . feeding supplement (NEPRO CARB STEADY) 1,000 mL (09/28/20 1014)  . fentaNYL infusion INTRAVENOUS Stopped (09/28/20 1015)  . norepinephrine (LEVOPHED) Adult infusion 10 mcg/min (09/28/20 1200)  . potassium PHOSPHATE IVPB (in mmol) 85 mL/hr at 09/28/20 1200  . prismasol BGK 4/2.5 1,500 mL/hr at 09/28/20 1122  . vancomycin Stopped (09/27/20 1539)    PRN Meds: sodium chloride, acetaminophen (TYLENOL) oral liquid 160 mg/5 mL, albuterol, bacitracin, diphenhydrAMINE, fentaNYL, heparin, midazolam, phenol, silver nitrate applicators, sodium chloride, sodium chloride flush  Physical Exam Vitals reviewed.   Constitutional:      General: She is not in acute distress.    Appearance: She is ill-appearing.     Comments: Sedated Generalized edema  Pulmonary:     Comments: Trach, vent Neurological:     Motor: Weakness present.     Comments: Able to follow commands and nods to some questions             Vital Signs: BP (!) 79/52   Pulse (!) 115   Temp (!) 96 F (35.6 C) (Axillary)   Resp (!) 33   Ht 5' 8"  (1.727 m)   Wt 84.1 kg   SpO2 98%   BMI 28.19 kg/m  SpO2: SpO2: 98 % O2 Device: O2 Device: Ventilator O2 Flow Rate: O2 Flow Rate (L/min): 15 L/min  Intake/output summary:   Intake/Output Summary (Last 24 hours) at 09/28/2020 1240 Last data filed at 09/28/2020 1200 Gross per 24 hour  Intake 3269.67 ml  Output 3817 ml  Net -547.33 ml   LBM: Last BM Date: 09/28/20 Baseline Weight: Weight: 99.7 kg Most recent weight: Weight: 84.1 kg       Palliative Assessment/Data: 10%      Palliative Care Assessment & Plan   Patient Profile/HPI:   69 y.o.femalewith past medical history of HTN, HLD, DM, recent COVID-19 infection 10/25/21admitted on 11/2/2021with hypoxia and atrial fibrillation RVR.Admitted to medical floor and started on Cardizem infusion and Eliquis. Hospital course complicated by abdominal pain, found to have perforated duodenal ulcer s/p explaratory lap, lysis of adhesions, Graham patch placement. Developed bleeding from surgical site post op day 6. 11/10 ICU transfer due to hemorrhagic shock, intubated, transfused, pressors, proned, and started on CRRT. Extubated 11/16 and re-intubated that evening due to respiratory distress, hypoxia, and altered mental status. Remains critically ill, intubated on mechanical ventilation. Ongoing CRRT with plans to attempt iHD per nephrology. Palliative medicine consultation for goals of care.  Assessment: - multifactorial shock, hemorrhagic and septic - acute hypoxic respiratory failure from ARDS - COVID-19 pneumonia - AKI on  CRRT - duodenal ulcer perforation s/p exploratory lap and Graham's patch  Recommendations/Plan: - continue current medical care -  family does not want staff to ask patient questions about continuing support - PMT will continue to check in every few days   Please call [phone] for urgent needs or need to actively re-engage with this patient and family   Goals of Care and Additional Recommendations: Limitations on Scope of Treatment: Full Scope Treatment  Code Status: Full code  Prognosis: Patient is very fragile and at very high risk for acute decline or death. She continues to require significant artificial support.   Discharge Planning: To Be Determined  Care plan was discussed with nursing  Thank you for allowing the Palliative Medicine Team to assist in the care of this patient.   Total Time 35 minutes Prolonged Time Billed  no       Greater than 50%  of this time was spent counseling and coordinating care related to the above assessment and plan.  Lavena Bullion, NP  Please contact Palliative Medicine Team phone at [phone] for questions and concerns.

## 2020-09-29 ENCOUNTER — Inpatient Hospital Stay (HOSPITAL_COMMUNITY): Payer: Medicare Other

## 2020-09-29 LAB — RENAL FUNCTION PANEL
Albumin: 1.4 g/dL — ABNORMAL LOW (ref 3.5–5.0)
Albumin: 1.3 g/dL — ABNORMAL LOW (ref 3.5–5.0)
Anion gap: 10 (ref 5–15)
Anion gap: 10 (ref 5–15)
BUN: 23 mg/dL (ref 8–23)
BUN: 25 mg/dL — ABNORMAL HIGH (ref 8–23)
CO2: 23 mmol/L (ref 22–32)
CO2: 23 mmol/L (ref 22–32)
Calcium: 9.4 mg/dL (ref 8.9–10.3)
Calcium: 9.8 mg/dL (ref 8.9–10.3)
Chloride: 102 mmol/L (ref 98–111)
Chloride: 103 mmol/L (ref 98–111)
Creatinine, Ser: 0.38 mg/dL — ABNORMAL LOW (ref 0.44–1.00)
Creatinine, Ser: 0.46 mg/dL (ref 0.44–1.00)
GFR, Estimated: >60 mL/min (ref >60)
GFR, Estimated: >60 mL/min (ref >60)
Glucose, Bld: 143 mg/dL — ABNORMAL HIGH (ref 70–99)
Glucose, Bld: 128 mg/dL — ABNORMAL HIGH (ref 70–99)
Phosphorus: 3.2 mg/dL (ref 2.5–4.6)
Phosphorus: 3.4 mg/dL (ref 2.5–4.6)
Potassium: 4.5 mmol/L (ref 3.5–5.1)
Potassium: 4.5 mmol/L (ref 3.5–5.1)
Sodium: 135 mmol/L (ref 135–145)
Sodium: 136 mmol/L (ref 135–145)

## 2020-09-29 LAB — GLUCOSE, CAPILLARY
Glucose-Capillary: 128 mg/dL — ABNORMAL HIGH (ref 70–99)
Glucose-Capillary: 130 mg/dL — ABNORMAL HIGH (ref 70–99)
Glucose-Capillary: 141 mg/dL — ABNORMAL HIGH (ref 70–99)
Glucose-Capillary: 142 mg/dL — ABNORMAL HIGH (ref 70–99)
Glucose-Capillary: 151 mg/dL — ABNORMAL HIGH (ref 70–99)
Glucose-Capillary: 96 mg/dL (ref 70–99)
Glucose-Capillary: 97 mg/dL (ref 70–99)

## 2020-09-29 LAB — CBC
HCT: 27.9 % — ABNORMAL LOW (ref 36.0–46.0)
HCT: 27.5 % — ABNORMAL LOW (ref 36.0–46.0)
Hemoglobin: 8.8 g/dL — ABNORMAL LOW (ref 12.0–15.0)
Hemoglobin: 8.9 g/dL — ABNORMAL LOW (ref 12.0–15.0)
MCH: 29 pg (ref 26.0–34.0)
MCH: 29.5 pg (ref 26.0–34.0)
MCHC: 31.5 g/dL (ref 30.0–36.0)
MCHC: 32.4 g/dL (ref 30.0–36.0)
MCV: 92.1 fL (ref 80.0–100.0)
MCV: 91.1 fL (ref 80.0–100.0)
Platelets: 79 10*3/uL — ABNORMAL LOW (ref 150–400)
Platelets: 64 10*3/uL — ABNORMAL LOW (ref 150–400)
RBC: 3.03 MIL/uL — ABNORMAL LOW (ref 3.87–5.11)
RBC: 3.02 MIL/uL — ABNORMAL LOW (ref 3.87–5.11)
RDW: 18.1 % — ABNORMAL HIGH (ref 11.5–15.5)
RDW: 18.3 % — ABNORMAL HIGH (ref 11.5–15.5)
WBC: 10.9 10*3/uL — ABNORMAL HIGH (ref 4.0–10.5)
WBC: 13.2 10*3/uL — ABNORMAL HIGH (ref 4.0–10.5)
nRBC: 6.8 % — ABNORMAL HIGH (ref 0.0–0.2)
nRBC: 12.3 % — ABNORMAL HIGH (ref 0.0–0.2)

## 2020-09-29 LAB — PREPARE RBC (CROSSMATCH)

## 2020-09-29 LAB — MAGNESIUM: Magnesium: 2.3 mg/dL (ref 1.7–2.4)

## 2020-09-29 MED ORDER — SODIUM CHLORIDE 0.9% IV SOLUTION: Freq: Once | INTRAVENOUS | Status: AC

## 2020-09-29 MED ORDER — VITAL 1.5 CAL PO LIQD
1000.0000 mL | ORAL | Status: DC
  Administered 2020-09-29 – 2020-10-13 (×15): 1000 mL
  Filled 2020-09-29 (×9): qty 1000

## 2020-09-29 MED ORDER — PROSOURCE TF PO LIQD
90.0000 mL | Freq: Three times a day (TID) | ORAL | Status: DC
  Administered 2020-09-29 – 2020-11-18 (×146): 90 mL
  Filled 2020-09-29 (×144): qty 90

## 2020-09-29 MED ORDER — ATROPINE SULFATE 1 MG/10ML IJ SOSY
PREFILLED_SYRINGE | INTRAMUSCULAR | Status: AC
  Filled 2020-09-29: qty 10

## 2020-09-29 MED ORDER — VASOPRESSIN 20 UNITS/100 ML INFUSION FOR SHOCK
0.0000 [IU]/min | INTRAVENOUS | Status: DC
  Administered 2020-09-29 – 2020-09-30 (×3): 0.03 [IU]/min via INTRAVENOUS
  Administered 2020-10-01 – 2020-10-04 (×7): 0.04 [IU]/min via INTRAVENOUS
  Filled 2020-09-29 (×13): qty 100

## 2020-09-29 NOTE — Progress Notes (Signed)
Patient ID: Candace [name], female   DOB: 1951-04-20, 69 y.o.   MRN: [id number] S: Had multiple episodes of bradycardia yesterday and last night.   O:BP 103/61   Pulse (!) 103   Temp (!) 96.1 F (35.6 C) (Axillary)   Resp (!) 36   Ht 5' 8"  (1.727 m)   Wt 84.5 kg   SpO2 98%   BMI 28.33 kg/m   Intake/Output Summary (Last 24 hours) at 09/29/2020 1237 Last data filed at 09/29/2020 1200 Gross per 24 hour  Intake 4609.73 ml  Output 2404 ml  Net 2205.73 ml   Intake/Output: I/O last 3 completed shifts: In: 6102.6 [I.V.:1261; Blood:327.5; NG/GT:2640; IV Piggyback:1874.1] Out: 4178 [Other:3723; Stool:455]  Intake/Output this shift:  Total I/O In: 509.8 [I.V.:184.8; NG/GT:325] Out: 498 [Other:498] Weight change: 0.4 kg Gen: on vent via trach CVS: IRR IRR, no rub, tachy at 103 Resp: occ rhonchi bilaterally Abd: +BS, soft, NT Ext: 1+ upper ext edema, trace ble edema  Recent Labs  Lab 09/26/20 0304 09/26/20 0304 09/26/20 1604 09/26/20 1843 09/27/20 0427 09/27/20 0427 09/27/20 1607 09/28/20 0429 09/28/20 1526 09/28/20 1807 09/28/20 2216 09/28/20 2232 09/29/20 0518  NA 135   < > 134*   < > 134*   < > 130* 130* 132* 131* 134* 136 135  K 4.0   < > 4.6   < > 4.5   < > 5.9* 3.9 5.1 4.8 4.1 4.1 4.5  CL 101   < > 101  --  100  --  97* 101  --  97* 103  --  102  CO2 26   < > 24  --  25  --  23 24  --  22 23  --  23  GLUCOSE 145*   < > 159*  --  118*  --  336* 85  --  311* 157*  --  143*  BUN 25*   < > 27*  --  24*  --  24* 23  --  28* 23  --  23  CREATININE 0.51   < > 0.48  --  0.45  --  0.54 0.38*  --  0.55 0.37*  --  0.38*  ALBUMIN 1.5*  --  1.4*  --  2.0*  --  1.6* 1.6*  --  1.5*  --   --  1.4*  CALCIUM 8.5*   < > 8.0*  --  8.8*  --  8.4* 8.9  --  9.4 8.8*  --  9.4  PHOS 2.0*  --  3.7  --  2.3*  --  6.9* 2.2*  --  4.9*  --   --  3.2   < > = values in this interval not displayed.   Liver Function Tests: Recent Labs  Lab 09/28/20 0429 09/28/20 1807 09/29/20 0518  ALBUMIN 1.6*  1.5* 1.4*   No results for input(s): LIPASE, AMYLASE in the last 168 hours. No results for input(s): AMMONIA in the last 168 hours. CBC: Recent Labs  Lab 09/23/20 0303 09/24/20 0443 09/26/20 0304 09/26/20 1843 09/27/20 0427 09/27/20 0427 09/28/20 0428 09/28/20 1526 09/28/20 2232 09/28/20 2240 09/29/20 0518  WBC 18.7*   < > 12.3*  --  10.8*   < > 12.2*  --   --  8.7 10.9*  NEUTROABS 17.5*  --   --   --  9.9*  --   --   --   --   --   --   HGB 7.7*   < >  9.6*   < > 8.0*   < > 7.7*   < > 6.5* 6.8* 8.8*  HCT 25.6*   < > 30.9*   < > 25.9*   < > 25.6*   < > 19.0* 22.2* 27.9*  MCV 89.5   < > 90.4  --  92.5  --  92.8  --   --  91.4 92.1  PLT 79*   < > 73*  --  77*   < > 96*  --   --  61* 79*   < > = values in this interval not displayed.   Cardiac Enzymes: No results for input(s): CKTOTAL, CKMB, CKMBINDEX, TROPONINI in the last 168 hours. CBG: Recent Labs  Lab 09/28/20 1946 09/29/20 0021 09/29/20 0428 09/29/20 0724 09/29/20 1209  GLUCAP 181* 96 142* 97 130*    Iron Studies: No results for input(s): IRON, TIBC, TRANSFERRIN, FERRITIN in the last 72 hours. Studies/Results: DG Chest Port 1 View  Result Date: 09/28/2020 CLINICAL DATA:  69 year old female, acute on chronic respiratory failure with hypoxia. EXAM: PORTABLE CHEST 1 VIEW COMPARISON:  09/28/2020 FINDINGS: Tracheostomy cannula remains in place in unchanged position. Enteric feeding tube courses off the inferior aspect of this image. Left subclavian hemodialysis catheter remains in place with the tip in the right atrium. Right upper extremity PICC line remains in place with the tip in the superior vena cava. Mild prominence of the main pulmonary artery, unchanged. Overall unchanged appearance of previously visualized bibasilar consolidative opacities with slightly increased lung volumes on this study. No new consolidative opacities. Likely trace bilateral pleural effusions. No pneumothorax. Unremarkable skeletal structures.  IMPRESSION: Similar appearing bibasilar consolidative opacities. Slightly improved lung volumes. Electronically Signed   By: Ruthann Cancer MD   On: 09/28/2020 16:19   DG Chest Port 1 View  Result Date: 09/28/2020 CLINICAL DATA:  Acute on chronic respiratory failure EXAM: PORTABLE CHEST 1 VIEW COMPARISON:  09/25/2020 FINDINGS: Tracheostomy and nasoenteric feeding tube extending beyond the margin of the examination into the upper abdomen, left subclavian large-bore central venous catheter with its tip within the deep right atrium, and right upper extremity PICC line with its tip within the superior vena cava are unchanged. Lung volumes are small and pulmonary insufflation has slightly decreased in the interval. Bibasilar consolidation is again identified. Patchy infiltrate within the right mid lung zone is unchanged. Possible tiny left pleural effusion. No pneumothorax. Cardiac size within normal limits. IMPRESSION: Slight interval decrease in pulmonary insufflation. Low lung volumes. Stable bibasilar pulmonary consolidation. Stable support lines and tubes. Electronically Signed   By: Fidela Salisbury MD   On: 09/28/2020 05:06   . B-complex with vitamin C  1 tablet Per Tube Daily  . chlorhexidine gluconate (MEDLINE KIT)  15 mL Mouth Rinse BID  . Chlorhexidine Gluconate Cloth  6 each Topical Daily  . chlorpheniramine-HYDROcodone  5 mL Per Tube Q12H  . darbepoetin (ARANESP) injection - DIALYSIS  60 mcg Subcutaneous Q Tue-1800  . feeding supplement (PROSource TF)  45 mL Per Tube BID  . insulin aspart  0-20 Units Subcutaneous Q4H  . insulin aspart  8 Units Subcutaneous Q4H  . insulin glargine  20 Units Subcutaneous Daily  . mouth rinse  15 mL Mouth Rinse QID  . midodrine  10 mg Per Tube TID WC  . oxyCODONE  10 mg Per Tube Q6H  . pantoprazole sodium  40 mg Per Tube BID  . QUEtiapine  100 mg Per Tube BID  . sodium chloride flush  10-40 mL Intracatheter Q12H    BMET    Component Value Date/Time   NA  135 09/29/2020 0518   K 4.5 09/29/2020 0518   CL 102 09/29/2020 0518   CO2 23 09/29/2020 0518   GLUCOSE 143 (H) 09/29/2020 0518   BUN 23 09/29/2020 0518   CREATININE 0.38 (L) 09/29/2020 0518   CALCIUM 9.4 09/29/2020 0518   GFRNONAA >60 09/29/2020 0518   CBC    Component Value Date/Time   WBC 10.9 (H) 09/29/2020 0518   RBC 3.03 (L) 09/29/2020 0518   HGB 8.8 (L) 09/29/2020 0518   HCT 27.9 (L) 09/29/2020 0518   PLT 79 (L) 09/29/2020 0518   MCV 92.1 09/29/2020 0518   MCH 29.0 09/29/2020 0518   MCHC 31.5 09/29/2020 0518   RDW 18.1 (H) 09/29/2020 0518   LYMPHSABS 0.3 (L) 09/27/2020 0427   MONOABS 0.1 09/27/2020 0427   EOSABS 0.0 09/27/2020 0427   BASOSABS 0.0 09/27/2020 0427    Assessment/Plan:  1. AKI- oliguric presumably due to ischemic ATN in setting of hemorrhagic shock. Started on CRRT 09/08/20 due to persistent oliguria and hyperkalemia.  1. All fluids 4K/2.5Ca: Pre-filter 400 ml/hr, post-filter 300 ml/hr, dialysate 1,500 ml/hr 2. UF goal 50 ml/hr as bp tolerates 3. No anticoagulation due to thrombocytopenia and ongoing GI blood loss. 4. Continue with current CRRT settings.  2. Hemorrhagic shock- ongoing blood loss and requiring pressors. 1. Transfuse as needed 3. Septic shock due to MRSA pneumonia. 4. Perforated duodenal ulcer- s/p exploratory lap and Graham patch placement per surgery. 5. Acute hypoxic/hypercapnic respiratory failure due to ARDS from Recent Covid-19 PNA- currently on vent via trach per PCCM.  6. Thrombocytopenia- follow 7. P. Atrial fibrillation- no anticoagulation due to ongoing GI bleed. 8. Vascular access- RIJ trialysis catheter 09/08/20 then removed. Left subclavian trialysis catheter placed 09/15/20 and will need new access this week. Hopefully can have [facility] given ongoing anuric renal failure (unless family wishes to continue with current level of care). 1. Will consult IR for tunneled HD catheter once her infection clears, however will need new  temp cath this week.  Discussed with PCCM. 9. Severe protein malnutrition- per primary svc. 10. Disposition- poor overall prognosis and will likely require LTC and chronic vent support. PCCM to discuss goals of care with family.   Donetta Potts, MD [employer] [phone]

## 2020-09-29 NOTE — Progress Notes (Signed)
NAMESaraphina [name], MRN:  [id number], DOB:  1951/09/19, LOS: 86 ADMISSION DATE:  08/31/2020, CONSULTATION DATE:  09/07/2020 REFERRING MD:  Dr Candace [name], CHIEF COMPLAINT:  Acute resp failure  Brief History   69 year old female who was previously diagnosed with Covid 08/23/2020.  Admitted 11/2 with AF-RVR, found to have a perforated duodenal underwent exploratory laparotomy with Phillip Heal patch placement.  Past Medical History  Covid pneumonia Atrial fibrillation CKD stage III Diabetes mellitus Hypertension Colon cancer Hyperlipidemia  Significant Hospital Events   11/2 Admitted  11/3 OR -> perforated duodenal ulcer 11/10 progressive hemorrhagic shock, intubated, transfused, pressors, proned; started on CRRT in PM 11/16 Extubated. Re-intubated overnight due to respiratory distress and hypoxia with decreased mentation 11/18  bronch'd/ cultures sent 11/19 hgb down getting blood 09/22/2020 spiked fever resume empirical antimicrobial therapy 11/26: hemorrhagic shock, hgb 5.6, increased pressors, CT A/P   Consults:  Cardiology [facility] Nephrology   Procedures:  11/3 Exploratory laparotomy, Phillip Heal patch, lysis of adhesion for duodenal ulceration postop day 6  R PICC 11/5 >> A line 11/9 >> out ETT 11/9 > 11/16, 11/16 >> 09/21/2020 09/21/2020 tracheostomy>> Lt [place] CVL 11/9 >> R IJ trialysis >> out  Significant Diagnostic Tests:  11/3 CT abd/ pelvis > 1. Positive for bowel perforation: Pneumoperitoneum and intermediate density free fluid in the abdomen. Prior total colectomy. The specific site of perforation is unclear-oral contrast present to the proximal jejunum has not obviously leaked. Note that there may be small bowel loops adherent to the ventral abdominal wall along the greater curve of the stomach. 2. Extensive bilateral lower lung pneumonia. No pleural effusion. 3. Other abdominal and pelvic viscera are stable since 2015, including bilateral adrenal adenomas. Chronic renal  parapelvic cysts. 4. Aortic Atherosclerosis 11/3 TTE > EF 70-75%, RV not well visualized, mildly reduced RV systolic function 07/68 CT chest/ abd/ pelvis> 1. Interval progression of diffuse bilateral hazy ground-glass airspace opacities with more focal areas of consolidation at the lung bases 2. Trace bilateral pleural effusions. 3. Postsurgical changes the abdomen as detailed above. No evidence for a postoperative abscess, however evaluation is limited by lack of IV contrast. 4. There is a 1.9 cm cystic appearing lesion located in the pancreatic body. This was not present on the patient's CT from 2015.  Follow-up with an outpatient contrast enhanced MRI is recommended. 5. The endometrial stripe appears diffusely thickened. Follow-up with pelvic ultrasound is recommended. Aortic Atherosclerosis 11/14 LE doppler studies > + DVT of right posterior tibial and peroneal vein, +dVT of left posterior tibial vein  11/26 CT ABD PEL > liver unremarkable, distended gallbladder with layering tiny gallstones versus sludge, no duct dilatation, mild hyperdensity right upper pole renal collecting system new.  No evidence of retroperitoneal bleeding.  Multifocal lower lobe predominant pulmonary infiltrates/pneumonia.  Small left pleural effusion.  Micro Data:  11/10 MRSA PCR > neg 11/10 BC x 2 > neg 09/22/2020 blood cultures x2>> S epi.  09/22/2020 sputum culture>> MRSA  Antimicrobials:  azithro 11/2 >11/3 Ceftriaxone 11/2  Fluconazole 11/3 Zosyn 11/3 >> 11/7 Vanc 11/10 off Cefepime 11/10 > 11/16  09/22/2020 vancomycin for MRSA PNA >>    Subjective:   Overnight, she had episodes of bradycardia and 20 second pause and given atropine. No loss of pulse. Also requiring 1U PRBC overnight. Increasing vasopressor requirement  Objective   Blood pressure (!) 84/54, pulse 91, temperature (!) 96.1 F (35.6 C), temperature source Axillary, resp. rate (!) 32, height 5\' 8"  (1.727 m), weight 84.5 kg, SpO2 99  %.  CVP:  [0 mmHg-8 mmHg] 8 mmHg  Vent Mode: PRVC FiO2 (%):  [60 %-100 %] 70 % Set Rate:  [26 bmp] 26 bmp Vt Set:  [510 mL] 510 mL PEEP:  [5 cmH20] 5 cmH20   Intake/Output Summary (Last 24 hours) at 09/29/2020 0741 Last data filed at 09/29/2020 0700 Gross per 24 hour  Intake 4670.89 ml  Output 2491 ml  Net 2179.89 ml   Filed Weights   09/27/20 0424 09/28/20 0420 09/29/20 0500  Weight: 85 kg 84.1 kg 84.5 kg   Physical Exam: General: Elderly chronically ill appearing, sedated HENT: [place], AT, OP clear, MMM, trach c/d/i on mechanical ventilation Eyes: EOMI, no scleral icterus Respiratory: Vented breath sounds bilaterally.  No crackles, wheezing or rales Cardiovascular: RRR, -M/R/G, no JVD GI: BS+, soft, nontender, ileostomy with stool  Extremities: Anasarca UE >LE Neuro: Sedated  Assessment & Plan:   Hemorrhagic shock  Acute blood loss anemia  Previously hgb 5.6 on 11/26 >> 8.0 on 11/19 New Hg drop to 6.8 on 12/1 Plan  -Continue to follow CBC and for any overt signs of blood loss.  -Transfused 1 U PRBC 12/1 with appropriate response -Recheck CBC in PM  Sedation needs, anxiety. Persistent need fentanyl infusion. Respiratory dyssynchrony, cough with decreased -Scheduled oxycodone every 6 hours -tussionex for cough suppression -Consider adding fentanyl patch to allow weaning of fentanyl infusion. -Added Seroquel on 11/30 -attempt to aggressively wean fent gtt  Acute hypoxic/hypercapnic respiratory failure due to ARDS from COVID-19 pneumonia c/b HCAP and failure to wean and failed extubation 11/16 Plan -Unclear whether she will be able to wean fully for mechanical ventilation. Does tolerate some high pressure support. Suspect that this would be a long-term wean or possibly permanent vent dependence. Family considering going home with a home ventilator. Other options include LTAC or vent SNF. Not entirely clear whether this consistent with the patient's overall wishes. Appreciate  palliative care input here. -Remain on full vent support for worsening hypoxemia -Wean FIO2/PEEP   Multifactorial shock, previously hemorrhagic as above, Now Septic shock, MRSA pneumonia 1/4 bottles BCX with Meth R Staph Epi, likely contaminant Plan -Continue vasopressor support to maintain MAP 60-65. Add vasopressin to levophed -Continue vancomycin, day 8 on 12/1 -Midodrine started 11/30  Bradycardia AFRVR - currently in NSR -In the setting of critical illness. S/p atropine -Telemetry -Maintain K and Mg for goal >4 and >2, respectivel  Anuric AKI on CRRT Plan -Appreciate nephrology management of her CVVHD -Unable to remove volume in setting of shock  Duodenal ulcer perforation status post ex lap and Graham's patch Plan -Appreciate CCS assistance in management -Ultimately would need a PEG tube to progress her care. Unable to do so currently given the associated risk of gastric insufflation that would be necessary for tube placement.  Paroxysmal A. fib, currently in sinus rhythm Plan -Continue telemetry monitoring -Have not restarted anticoagulation given recurrent GI bleeding this admission  Thrombocytopenia - stable in 60-70s Plan -Continue to follow CBC  Glucose control - no recent hypogoglycemia Plan -Sliding-scale insulin, change Levemir to Lantus as she has been having some hypoglycemic episodes overnight  1.9 cm cystic appearing lesion located in the pancreatic body Plan -No intervention at this time, plan outpatient follow-up  History of stage IV colon cancer Status post pulmonary metastatectomies at [facility] + Chemo -No intervention at this time  Indeterminate bilateral DVT Patient had bilateral lower extremity Dopplers done, suggestive of possible indeterminate DVT, spoke with IR for IVC filter placement, after reevaluating ultrasound of  lower extremities it was suggested patient does not have femoral/popliteal DVTs, so IVC filter was not placed Plan: -Currently  following off anticoagulation  Best practice:  Diet: Tube feeds Pain/Anxiety/Delirium protocol (if indicated) fentanyl drip, oxycodone per tube VAP protocol (if indicated): yes DVT prophylaxis: SCDs GI prophylaxis: Protonix Glucose control: lantus + ssi  Mobility: Bedrest Code Status: Full Family Communication: Palliative care discussed Laie 11/30. Confirming full code. Attempted to call daughter [name] this morning, no answer. Unable to leave VM. Disposition: ICU  The patient is critically ill with multiple organ systems failure and requires high complexity decision making for assessment and support, frequent evaluation and titration of therapies, application of advanced monitoring technologies and extensive interpretation of multiple databases.  Independent Critical Care Time: 33 Minutes.   Rodman Pickle, M.D. [facility] Pulmonary/Critical Care Medicine 09/29/2020 7:41 AM   Please see Amion for pager number to reach on-call Pulmonary and Critical Care Team.

## 2020-09-29 NOTE — Progress Notes (Signed)
eLink Physician-Brief Progress Note Patient Name: Marena Witts DOB: 12-Aug-1951 MRN: [id number]   Date of Service  09/29/2020  HPI/Events of Note  Anemia - Hgb = 6.8.   eICU Interventions  Will transfuse 1 unit PRBC now.      Intervention Category Major Interventions: Other:  [place] Esch Cornelia Copa 09/29/2020, 12:01 AM

## 2020-09-29 NOTE — Progress Notes (Signed)
SLP Cancellation Note  Patient Details Name: Candace [name] MRN: [id number] DOB: 1951-05-16   Cancelled treatment:        Had planned to see this afternoon for inline PMV with RT. Chart review revealed pt had bradycardic episodes and is sedated today. Check with RN who confirms the above and pt not currently appropriate. Will continue efforts.    [name] 09/29/2020, 2:01 PM   [name] Caroli.Ed Risk analyst [phone] Office [phone]

## 2020-09-29 NOTE — Procedures (Signed)
Central Venous Catheter Insertion Procedure Note  Candace [name]  [id number]  1951-06-15  Date:09/29/20  Time:5:25 PM   Provider Performing:Latrell Reitan   Procedure: Insertion of Non-tunneled Central Venous Catheter(36556)with US guidance (02334)    Indication(s) Hemodialysis  Consent Risks of the procedure as well as the alternatives and risks of each were explained to the patient and/or caregiver.  Consent for the procedure was obtained and is signed in the bedside chart  Anesthesia Topical only with 1% lidocaine   Timeout Verified patient identification, verified procedure, site/side was marked, verified correct patient position, special equipment/implants available, medications/allergies/relevant history reviewed, required imaging and test results available.  Sterile Technique Maximal sterile technique including full sterile barrier drape, hand hygiene, sterile gown, sterile gloves, mask, hair covering, sterile ultrasound probe cover (if used).  Procedure Description Area of catheter insertion was cleaned with chlorhexidine and draped in sterile fashion.   With real-time ultrasound guidance a HD catheter was placed into the right subclavian vein.  Nonpulsatile blood flow and easy flushing noted in all ports.  The catheter was sutured in place and sterile dressing applied.  55F 15cm HD catheter inserted to 15cm.  Unable to pass wire via supraclavicular approach. Single pass subclavicular approach.      Complications/Tolerance None; patient tolerated the procedure well. Chest X-ray is ordered to verify placement for internal jugular or subclavian cannulation.  Chest x-ray is not ordered for femoral cannulation.  EBL Minimal  Specimen(s) None  Candace Brood, MD [facility] ICU Physician [place]  Pager: [phone] Or Epic Secure Chat After hours: [phone].  09/29/2020, 5:30 PM

## 2020-09-30 DIAGNOSIS — J9602 Acute respiratory failure with hypercapnia: Secondary | ICD-10-CM | POA: Diagnosis not present

## 2020-09-30 DIAGNOSIS — I4891 Unspecified atrial fibrillation: Secondary | ICD-10-CM | POA: Diagnosis not present

## 2020-09-30 DIAGNOSIS — J9601 Acute respiratory failure with hypoxia: Secondary | ICD-10-CM | POA: Diagnosis not present

## 2020-09-30 LAB — RENAL FUNCTION PANEL
Albumin: 1.2 g/dL — ABNORMAL LOW (ref 3.5–5.0)
Anion gap: 10 (ref 5–15)
BUN: 29 mg/dL — ABNORMAL HIGH (ref 8–23)
CO2: 22 mmol/L (ref 22–32)
Calcium: 8.8 mg/dL — ABNORMAL LOW (ref 8.9–10.3)
Chloride: 102 mmol/L (ref 98–111)
Creatinine, Ser: 0.54 mg/dL (ref 0.44–1.00)
GFR, Estimated: >60 mL/min (ref >60)
Glucose, Bld: 332 mg/dL — ABNORMAL HIGH (ref 70–99)
Phosphorus: 2.6 mg/dL (ref 2.5–4.6)
Potassium: 4.6 mmol/L (ref 3.5–5.1)
Sodium: 134 mmol/L — ABNORMAL LOW (ref 135–145)

## 2020-09-30 LAB — GLUCOSE, CAPILLARY
Glucose-Capillary: 129 mg/dL — ABNORMAL HIGH (ref 70–99)
Glucose-Capillary: 175 mg/dL — ABNORMAL HIGH (ref 70–99)
Glucose-Capillary: 207 mg/dL — ABNORMAL HIGH (ref 70–99)
Glucose-Capillary: 224 mg/dL — ABNORMAL HIGH (ref 70–99)
Glucose-Capillary: 246 mg/dL — ABNORMAL HIGH (ref 70–99)
Glucose-Capillary: 250 mg/dL — ABNORMAL HIGH (ref 70–99)

## 2020-09-30 LAB — POCT I-STAT 7, (LYTES, BLD GAS, ICA,H+H)
Acid-base deficit: 1 mmol/L (ref 0.0–2.0)
Bicarbonate: 24.5 mmol/L (ref 20.0–28.0)
Calcium, Ion: 1.45 mmol/L — ABNORMAL HIGH (ref 1.15–1.40)
HCT: 27 % — ABNORMAL LOW (ref 36.0–46.0)
Hemoglobin: 9.2 g/dL — ABNORMAL LOW (ref 12.0–15.0)
O2 Saturation: 92 %
Patient temperature: 98.2
Potassium: 4.5 mmol/L (ref 3.5–5.1)
Sodium: 136 mmol/L (ref 135–145)
TCO2: 26 mmol/L (ref 22–32)
pCO2 arterial: 43.5 mmHg (ref 32.0–48.0)
pH, Arterial: 7.357 (ref 7.350–7.450)
pO2, Arterial: 67 mmHg — ABNORMAL LOW (ref 83.0–108.0)

## 2020-09-30 LAB — CBC
HCT: 24.4 % — ABNORMAL LOW (ref 36.0–46.0)
Hemoglobin: 7.6 g/dL — ABNORMAL LOW (ref 12.0–15.0)
MCH: 29 pg (ref 26.0–34.0)
MCHC: 31.1 g/dL (ref 30.0–36.0)
MCV: 93.1 fL (ref 80.0–100.0)
Platelets: 57 10*3/uL — ABNORMAL LOW (ref 150–400)
RBC: 2.62 MIL/uL — ABNORMAL LOW (ref 3.87–5.11)
RDW: 18.7 % — ABNORMAL HIGH (ref 11.5–15.5)
WBC: 11.5 10*3/uL — ABNORMAL HIGH (ref 4.0–10.5)
nRBC: 4 % — ABNORMAL HIGH (ref 0.0–0.2)

## 2020-09-30 LAB — MAGNESIUM: Magnesium: 2.2 mg/dL (ref 1.7–2.4)

## 2020-09-30 MED ORDER — CLONAZEPAM 0.5 MG PO TBDP
1.0000 mg | ORAL_TABLET | Freq: Two times a day (BID) | ORAL | Status: DC
  Administered 2020-09-30 – 2020-10-30 (×61): 1 mg
  Filled 2020-09-30 (×61): qty 2

## 2020-09-30 MED ORDER — CLONAZEPAM 0.1 MG/ML ORAL SUSPENSION: 1.0000 mg | Freq: Two times a day (BID) | ORAL | Status: DC

## 2020-09-30 NOTE — Progress Notes (Signed)
Occupational Therapy Treatment Patient Details Name: Candace [name] MRN: [id number] DOB: 01-18-1951 Today's Date: 09/30/2020    History of present illness 69 y.o. female with medical history significant for recent covid pna, obesity, colon cancer s/p colon resection with colostomy bag, HLD, NIDDM2, CKD3 presented to ED after her follow-up nurse advised her to present to ED for elevated HR. +afib, elevated troponins with demand ischemia,  CT scan is positive for bowel perforation with pneumomediastinum 11/03 exp lap with repair of perforated ulcer. 11/10 t/f to ICU- intubated/sedated/proned. Trach placed 11/24. CRRT.   OT comments  Nsg asking to limit ROM to RUE due to difficulty with CRRT catheter at this time. Pt on 60 % FiO2; PRVC; Peep 5.  Pt following commands (stick out tongue, close eyes, squeeze hands) with delay. Minimal active movement B hands only. Total A with all ADL tasks. Will reassess goals/downgrade goals next session. Pt appropriate for rehab at [facility] setting.   Follow Up Recommendations  LTACH;Supervision/Assistance - 24 hour    Equipment Recommendations  Wheelchair (measurements OT);Wheelchair cushion (measurements OT);Hospital bed    Recommendations for Other Services PT consult    Precautions / Restrictions Precautions Precautions: Fall Precaution Comments: baseline colostomy; trach-vent; CRRT; A line       Mobility Bed Mobility Overal bed mobility: Needs Assistance Bed Mobility: Rolling Rolling: Total assist;+2 for physical assistance;+2 for safety/equipment         General bed mobility comments: not attempted today due to medical status  Transfers                 General transfer comment: not able to attempt    Balance                                           ADL either performed or assessed with clinical judgement   ADL                                         General ADL Comments: totl A     Vision        Perception     Praxis      Cognition Arousal/Alertness: Lethargic Behavior During Therapy: Flat affect Overall Cognitive Status: Difficult to assess Area of Impairment: Attention                   Current Attention Level: Focused   Following Commands: Follows one step commands inconsistently;Follows one step commands with increased time       General Comments: Pt will close eyes; stick out tongue adn try to squeeze hands on command; Eyes remain open for less than 15 seconds; Does not initiate eye contact contact but will look at you after delay on command        Exercises Exercises: General Upper Extremity General Exercises - Upper Extremity Shoulder Flexion: PROM;Left;10 reps;Supine Shoulder ABduction: PROM;Left;10 reps;Supine Elbow Flexion: PROM;Left;10 reps;Supine Elbow Extension: PROM;Left;10 reps;Supine Wrist Flexion: Right;PROM;5 reps Digit Composite Flexion: Both;10 reps;Supine;PROM;AAROM Composite Extension: AAROM;PROM;Both;5 reps;Supine  Other Exercises Other Exercises: attempted to have pt hold yonker to self suction. Did not appear to help hold yonker - total A   Shoulder Instructions       General Comments Pt squeezes both hands minimally; does not initiate elbow or shoulder ROM. R shoulder  and elbow movement not attempted due to nsg request due to concerns over CRRT catheter     Pertinent Vitals/ Pain       Pain Assessment: Faces Faces Pain Scale: No hurt  Home Living                                          Prior Functioning/Environment              Frequency  Min 2X/week        Progress Toward Goals  OT Goals(current goals can now be found in the care plan section)  Progress towards OT goals: Not progressing toward goals - comment;OT to reassess next treatment  Acute Rehab OT Goals Patient Stated Goal: not able to state OT Goal Formulation: Patient unable to participate in goal setting Time For Goal  Achievement: 10/06/20 Potential to Achieve Goals: Fair ADL Goals Pt Will Perform Grooming: with mod assist;bed level;sitting Pt Will Perform Upper Body Bathing: with max assist;sitting;bed level Pt Will Perform Upper Body Dressing: with set-up;with supervision;sitting Pt Will Transfer to Toilet: with max assist;with +2 assist;squat pivot transfer;bedside commode Pt Will Perform Toileting - Clothing Manipulation and hygiene: with min guard assist;sitting/lateral leans;sit to/from stand Pt Will Perform Tub/Shower Transfer: with supervision;ambulating;shower seat;rolling walker Pt/caregiver will Perform Home Exercise Program: Increased strength;Both right and left upper extremity;With written HEP provided;With theraband Additional ADL Goal #1: Pt will sustain attention to ADL task with min VCs Additional ADL Goal #2: Pt will sit EOB for 5 mins with mod A for trunk support with VSS Additional ADL Goal #3: Pt will complete bed mobility with mod A as precursor to ADLs  Plan Discharge plan remains appropriate    Co-evaluation                 AM-PAC OT "6 Clicks" Daily Activity     Outcome Measure   Help from another person eating meals?: Total Help from another person taking care of personal grooming?: Total Help from another person toileting, which includes using toliet, bedpan, or urinal?: Total Help from another person bathing (including washing, rinsing, drying)?: Total Help from another person to put on and taking off regular upper body clothing?: Total Help from another person to put on and taking off regular lower body clothing?: Total 6 Click Score: 6    End of Session Equipment Utilized During Treatment: Other (comment) (vent)  OT Visit Diagnosis: Unsteadiness on feet (R26.81);Other abnormalities of gait and mobility (R26.89);Muscle weakness (generalized) (M62.81);Other symptoms and signs involving cognitive function   Activity Tolerance Patient limited by lethargy;Patient  limited by fatigue   Patient Left in bed;with call bell/phone within reach   Nurse Communication Mobility status        Time: 1150-1210 OT Time Calculation (min): 20 min  Charges: OT General Charges $OT Visit: 1 Visit OT Treatments $Therapeutic Activity: 8-22 mins  Maurie Boettcher, OT/L   Acute OT Clinical Specialist [place] Pager [phone] Office [phone]    [facility] 09/30/2020, 12:29 PM

## 2020-09-30 NOTE — Progress Notes (Signed)
NAMEGraci [name], MRN:  [id number], DOB:  January 15, 1951, LOS: 43 ADMISSION DATE:  08/31/2020, CONSULTATION DATE:  09/07/2020 REFERRING MD:  Dr Candace [name], CHIEF COMPLAINT:  Acute resp failure  Brief History   69 year old female who was previously diagnosed with Covid 08/23/2020.  Admitted 11/2 with AF-RVR, found to have a perforated duodenal underwent exploratory laparotomy with Phillip Heal patch placement.  Past Medical History  Covid pneumonia Atrial fibrillation CKD stage III Diabetes mellitus Hypertension Colon cancer Hyperlipidemia  Significant Hospital Events   11/2 Admitted  11/3 OR -> perforated duodenal ulcer 11/10 progressive hemorrhagic shock, intubated, transfused, pressors, proned; started on CRRT in PM 11/16 Extubated. Re-intubated overnight due to respiratory distress and hypoxia with decreased mentation 11/18  bronch'd/ cultures sent 11/19 hgb down getting blood 09/22/2020 spiked fever resume empirical antimicrobial therapy 11/26: hemorrhagic shock, hgb 5.6, increased pressors, CT A/P   Consults:  Cardiology [facility] Nephrology   Procedures:  11/3 Exploratory laparotomy, Phillip Heal patch, lysis of adhesion for duodenal ulceration postop day 6  R PICC 11/5 >> A line 11/9 >> out ETT 11/9 > 11/16, 11/16 >> 09/21/2020 09/21/2020 tracheostomy>> Lt  CVL 11/9 >> R IJ trialysis >> out HD catheter 12/1 >>   Significant Diagnostic Tests:  11/3 CT abd/ pelvis > 1. Positive for bowel perforation: Pneumoperitoneum and intermediate density free fluid in the abdomen. Prior total colectomy. The specific site of perforation is unclear-oral contrast present to the proximal jejunum has not obviously leaked. Note that there may be small bowel loops adherent to the ventral abdominal wall along the greater curve of the stomach. 2. Extensive bilateral lower lung pneumonia. No pleural effusion. 3. Other abdominal and pelvic viscera are stable since 2015, including bilateral adrenal adenomas.  Chronic renal parapelvic cysts. 4. Aortic Atherosclerosis 11/3 TTE > EF 70-75%, RV not well visualized, mildly reduced RV systolic function 63/87 CT chest/ abd/ pelvis> 1. Interval progression of diffuse bilateral hazy ground-glass airspace opacities with more focal areas of consolidation at the lung bases 2. Trace bilateral pleural effusions. 3. Postsurgical changes the abdomen as detailed above. No evidence for a postoperative abscess, however evaluation is limited by lack of IV contrast. 4. There is a 1.9 cm cystic appearing lesion located in the pancreatic body. This was not present on the patient's CT from 2015.  Follow-up with an outpatient contrast enhanced MRI is recommended. 5. The endometrial stripe appears diffusely thickened. Follow-up with pelvic ultrasound is recommended. Aortic Atherosclerosis 11/14 LE doppler studies > + DVT of right posterior tibial and peroneal vein, +dVT of left posterior tibial vein  11/26 CT ABD PEL > liver unremarkable, distended gallbladder with layering tiny gallstones versus sludge, no duct dilatation, mild hyperdensity right upper pole renal collecting system new.  No evidence of retroperitoneal bleeding.  Multifocal lower lobe predominant pulmonary infiltrates/pneumonia.  Small left pleural effusion.  Micro Data:  11/10 MRSA PCR > neg 11/10 BC x 2 > neg 09/22/2020 blood cultures x2>> S epi.  09/22/2020 sputum culture>> MRSA Blood 12/1 >>    Antimicrobials:  azithro 11/2 >11/3 Ceftriaxone 11/2  Fluconazole 11/3 Zosyn 11/3 >> 11/7 Vanc 11/10 off Cefepime 11/10 > 11/16 09/22/2020 vancomycin for MRSA PNA >> 12/2   Subjective:   Hemodialysis catheter changed on 12/1 Norepinephrine weaned down to 16 Vasopressin running Fentanyl at 100  Objective   Blood pressure (!) 112/56, pulse 87, temperature (!) 96.8 F (36 C), temperature source Axillary, resp. rate (!) 29, height 5\' 8"  (1.727 m), weight 84.5 kg, SpO2 99 %.  CVP:  [3 mmHg-49 mmHg] 3  mmHg  Vent Mode: PRVC FiO2 (%):  [60 %] 60 % Set Rate:  [26 bmp] 26 bmp Vt Set:  [510 mL] 510 mL PEEP:  [5 cmH20] 5 cmH20 Plateau Pressure:  [29 cmH20-32 cmH20] 32 cmH20   Intake/Output Summary (Last 24 hours) at 09/30/2020 7001 Last data filed at 09/30/2020 0800 Gross per 24 hour  Intake 2311.48 ml  Output 1992 ml  Net 319.48 ml   Filed Weights   09/27/20 0424 09/28/20 0420 09/29/20 0500  Weight: 85 kg 84.1 kg 84.5 kg   Physical Exam: General: Elderly woman, acute and chronically ill, ventilated via tracheostomy, tachypneic HENT: Oropharynx moist, pupils equal, trach site looks good, no secretions Eyes: Equal Respiratory: Coarse bilaterally, no wheezes or crackles Cardiovascular: Regular, 90s, distant, no murmur GI: Nondistended, positive bowel sounds, ileostomy well-appearing Extremities: Bilateral upper and lower extremity edema, anasarca Neuro: Awake, nods to questions, follows commands   Assessment & Plan:   Hemorrhagic shock  Acute blood loss anemia  Previously hgb 5.6 on 11/26 >> 8.0 on 11/19 New Hg drop to 6.8 on 12/1 Plan  -No overt signs of blood loss.  Continue to follow CBC  Sedation needs, anxiety. Persistent need fentanyl infusion. Respiratory dyssynchrony, cough with decreased -Schedule oxycodone every 6 hours, Tussionex for cough suppression -Seroquel added on 11/30 -Add clonazepam on 12/2 -Continue efforts to wean fentanyl infusion as this is impacting blood pressure, pressor needs  Acute hypoxic/hypercapnic respiratory failure due to ARDS from COVID-19 pneumonia c/b HCAP and failure to wean and failed extubation 11/16 Plan -Concerning that she will not be able to fully for mechanical ventilation. Does intermittently tolerate some high pressure support.  This would be a long-term wean or possibly permanent vent dependence. Family considering going home with a home ventilator.  Her options are likely include LTAC for vent SNF, although not entirely clear  this is consistent with patient's wishes.  Palliative care input appreciated -Continue to wean FiO2  Multifactorial shock, previously hemorrhagic as above, Septic shock, MRSA pneumonia, treated for 8 days 1/4 bottles BCX with Meth R Staph Epi, likely contaminant Plan -Continue to wean norepinephrine as able.  MAP goal 60. -Plan to stop vasopressin when norepinephrine needs decrease below 10 -Continue midodrine, started 11/30 -Completed full course of vancomycin 12/2 for MRSA pneumonia.  Plan to DC, follow-up antibiotics. -Blood cultures ordered and pending 12/1  Bradycardia AFRVR - currently in NSR -Atropine at bedside -Overall labile status, attempting to aggressively manage electrolytes  Anuric AKI on CRRT Plan -Appreciate nephrology management of her CVVHD -Would like to transition to intermittent HD but unable to do so due to shock state, pressor needs.  Question whether we have reached a plateau in improvement  Duodenal ulcer perforation status post ex lap and Graham's patch Plan -Appreciate CTS assistance and management -Ultimately needs a PEG tube if were going to continue aggressive course of care.  Unable to do so currently because gastric insufflation would put her duodenal patch at risk.  We will proceed with an okay with [facility]CS  Paroxysmal A. fib, currently in sinus rhythm Plan -Continue telemetry monitoring -Hold off on restarting anticoagulation given GI bleeding this admission  Thrombocytopenia - Plan -Continue to follow CBC  Glucose control - no recent hypogoglycemia Plan -Lantus 20 daily -Sliding scale insulin as ordered  1.9 cm cystic appearing lesion located in the pancreatic body Plan -No intervention at this time, plan outpatient follow-up  History of stage IV colon cancer Status  post pulmonary metastatectom[facility] at [facility] + Chemo -No intervention at this time  Indeterminate bilateral DVT Patient had bilateral lower extremity Dopplers done, suggestive of  possible indeterminate DVT, spoke with IR for IVC filter placement, after reevaluating ultrasound of lower extremities it was suggested patient does not have femoral/popliteal DVTs, so IVC filter was not placed Plan: -Currently following off anticoagulation  Best practice:  Diet: Tube feeds Pain/Anxiety/Delirium protocol (if indicated) fentanyl drip, oxycodone per tube, clonazepam VAP protocol (if indicated): yes DVT prophylaxis: SCDs GI prophylaxis: Protonix Glucose control: lantus + ssi  Mobility: Bedrest Code Status: Full Family Communication: Palliative care discussed [place] 11/30. Confirming full code.  Plan to discuss with daughter at bedside 12/2 Disposition: ICU  The patient is critically ill with multiple organ systems failure and requires high complexity decision making for assessment and support, frequent evaluation and titration of therapies, application of advanced monitoring technologies and extensive interpretation of multiple databases.  Independent Critical Care Time: 35 Minutes.   Baltazar Apo, MD, PhD 09/30/2020, 9:22 AM [facility] [phone] or if no answer [phone]

## 2020-09-30 NOTE — Progress Notes (Signed)
  Speech Language Pathology  Patient Details Name: Candace [name] MRN: [id number] DOB: 07-18-1951 Today's Date: 09/30/2020 Time:  -     RT and ST planning to see pt around 2:00 for inline PMV              [name] 09/30/2020, 11:37 AM   [name] Caroli.Ed Risk analyst [phone] Office [phone]

## 2020-09-30 NOTE — Progress Notes (Signed)
SLP Cancellation Note  Patient Details Name: Candace [name] MRN: [id number] DOB: February 16, 1951   Cancelled treatment:        TR, ST and RN present for inline PMV tx. Pt was unable to adequately arouse with max stimuli. She has been given Klonopin to assist with Fentanyl wean. Pt has not been appropriate each attempt. SLP will discontinue orders at this time and please reconsult if becomes appropriate.    [name] 09/30/2020, 3:18 PM

## 2020-09-30 NOTE — Progress Notes (Signed)
Patient ID: Candace [name], female   DOB: 1951/06/12, 69 y.o.   MRN: [id number] S: New HD catheter placed 09/29/20 with some difficulties restarting. O:BP (!) 92/47   Pulse 83   Temp (!) 95.5 F (35.3 C) (Axillary)   Resp (!) 26   Ht _0  (1.727 m)   Wt 84.5 kg   SpO2 99%   BMI 28.33 kg/m   Intake/Output Summary (Last 24 hours) at 09/30/2020 1304 Last data filed at 09/30/2020 1200 Gross per 24 hour  Intake 2342.25 ml  Output 1909 ml  Net 433.25 ml   Intake/Output: I/O last 3 completed shifts: In: 5193.4 [I.V.:1418.8; Blood:327.5; GG/YI:9485; IV Piggyback:1024.1] Out: 4627 [OJJKK:9381; Stool:375]  Intake/Output this shift:  Total I/O In: 452.1 [I.V.:152.1; NG/GT:300] Out: 433 [Other:433] Weight change:  Gen: on vent via trach CVS: IRR IRR Resp: occ rhonchi Abd:+BS, soft, NT/ND Ext: 1+ upper ext edema trace lower ext edema  Recent Labs  Lab 09/27/20 0427 09/27/20 0427 09/27/20 1607 09/27/20 1607 09/28/20 0429 09/28/20 1526 09/28/20 1807 09/28/20 2216 09/28/20 2232 09/29/20 0518 09/29/20 1550 09/29/20 1600 09/30/20 0413  NA 134*   < > 130*   < > 130*   < > 131* 134* 136 135 136 136 134*  K 4.5   < > 5.9*   < > 3.9   < > 4.8 4.1 4.1 4.5 4.5 4.5 4.6  CL 100   < > 97*  --  101  --  97* 103  --  102  --  103 102  CO2 25   < > 23  --  24  --  22 23  --  23  --  23 22  GLUCOSE 118*   < > 336*  --  85  --  311* 157*  --  143*  --  128* 332*  BUN 24*   < > 24*  --  23  --  28* 23  --  23  --  25* 29*  CREATININE 0.45   < > 0.54  --  0.38*  --  0.55 0.37*  --  0.38*  --  0.46 0.54  ALBUMIN 2.0*  --  1.6*  --  1.6*  --  1.5*  --   --  1.4*  --  1.3* 1.2*  CALCIUM 8.8*   < > 8.4*  --  8.9  --  9.4 8.8*  --  9.4  --  9.8 8.8*  PHOS 2.3*  --  6.9*  --  2.2*  --  4.9*  --   --  3.2  --  3.4 2.6   < > = values in this interval not displayed.   Liver Function Tests: Recent Labs  Lab 09/29/20 0518 09/29/20 1600 09/30/20 0413  ALBUMIN 1.4* 1.3* 1.2*   No results for  input(s): LIPASE, AMYLASE in the last 168 hours. No results for input(s): AMMONIA in the last 168 hours. CBC: Recent Labs  Lab 09/27/20 0427 09/27/20 0427 09/28/20 0428 09/28/20 1526 09/28/20 2240 09/28/20 2240 09/29/20 0518 09/29/20 0518 09/29/20 1400 09/29/20 1550 09/30/20 0413  WBC 10.8*   < > 12.2*  --  8.7   < > 10.9*  --  13.2*  --  11.5*  NEUTROABS 9.9*  --   --   --   --   --   --   --   --   --   --   HGB 8.0*   < > 7.7*   < > 6.8*   < >  8.8*   < > 8.9* 9.2* 7.6*  HCT 25.9*   < > 25.6*   < > 22.2*   < > 27.9*   < > 27.5* 27.0* 24.4*  MCV 92.5   < > 92.8  --  91.4  --  92.1  --  91.1  --  93.1  PLT 77*   < > 96*  --  61*   < > 79*  --  64*  --  57*   < > = values in this interval not displayed.   Cardiac Enzymes: No results for input(s): CKTOTAL, CKMB, CKMBINDEX, TROPONINI in the last 168 hours. CBG: Recent Labs  Lab 09/29/20 1932 09/29/20 2337 09/30/20 0247 09/30/20 0816 09/30/20 1256  GLUCAP 128* 151* 250* 207* 246*    Iron Studies: No results for input(s): IRON, TIBC, TRANSFERRIN, FERRITIN in the last 72 hours. Studies/Results: DG CHEST PORT 1 VIEW  Result Date: 09/29/2020 CLINICAL DATA:  Central line placement EXAM: PORTABLE CHEST 1 VIEW COMPARISON:  Chest radiograph from one day prior. FINDINGS: Tracheostomy tube tip overlies the tracheal air column just below the thoracic inlet. Right PICC terminates over the middle third of the SVC. New right subclavian central venous catheter terminates over the lower third of the SVC. Enteric tube enters stomach with the tip not seen on this image. Stable cardiomediastinal silhouette with normal heart size. No pneumothorax. Probable stable small bilateral pleural effusions. Extensive patchy consolidation in the mid to lower lungs bilaterally, not substantially changed. IMPRESSION: New right subclavian central venous catheter terminates over the lower third of the SVC. No pneumothorax. Otherwise stable well-positioned support  structures. Stable small bilateral pleural effusions and extensive patchy consolidation in the mid to lower lungs bilaterally. Electronically Signed   By: Ilona Sorrel M.D.   On: 09/29/2020 17:35   DG Chest Port 1 View  Result Date: 09/28/2020 CLINICAL DATA:  69 year old female, acute on chronic respiratory failure with hypoxia. EXAM: PORTABLE CHEST 1 VIEW COMPARISON:  09/28/2020 FINDINGS: Tracheostomy cannula remains in place in unchanged position. Enteric feeding tube courses off the inferior aspect of this image. Left subclavian hemodialysis catheter remains in place with the tip in the right atrium. Right upper extremity PICC line remains in place with the tip in the superior vena cava. Mild prominence of the main pulmonary artery, unchanged. Overall unchanged appearance of previously visualized bibasilar consolidative opacities with slightly increased lung volumes on this study. No new consolidative opacities. Likely trace bilateral pleural effusions. No pneumothorax. Unremarkable skeletal structures. IMPRESSION: Similar appearing bibasilar consolidative opacities. Slightly improved lung volumes. Electronically Signed   By: Ruthann Cancer MD   On: 09/28/2020 16:19   . B-complex with vitamin C  1 tablet Per Tube Daily  . chlorhexidine gluconate (MEDLINE KIT)  15 mL Mouth Rinse BID  . Chlorhexidine Gluconate Cloth  6 each Topical Daily  . chlorpheniramine-HYDROcodone  5 mL Per Tube Q12H  . clonazepam  1 mg Per Tube BID  . darbepoetin (ARANESP) injection - DIALYSIS  60 mcg Subcutaneous Q Tue-1800  . feeding supplement (PROSource TF)  90 mL Per Tube TID  . insulin aspart  0-20 Units Subcutaneous Q4H  . insulin aspart  8 Units Subcutaneous Q4H  . insulin glargine  20 Units Subcutaneous Daily  . mouth rinse  15 mL Mouth Rinse QID  . midodrine  10 mg Per Tube TID WC  . oxyCODONE  10 mg Per Tube Q6H  . pantoprazole sodium  40 mg Per Tube BID  .  QUEtiapine  100 mg Per Tube BID  . sodium chloride  flush  10-40 mL Intracatheter Q12H    BMET    Component Value Date/Time   NA 134 (L) 09/30/2020 0413   K 4.6 09/30/2020 0413   CL 102 09/30/2020 0413   CO2 22 09/30/2020 0413   GLUCOSE 332 (H) 09/30/2020 0413   BUN 29 (H) 09/30/2020 0413   CREATININE 0.54 09/30/2020 0413   CALCIUM 8.8 (L) 09/30/2020 0413   GFRNONAA >60 09/30/2020 0413   CBC    Component Value Date/Time   WBC 11.5 (H) 09/30/2020 0413   RBC 2.62 (L) 09/30/2020 0413   HGB 7.6 (L) 09/30/2020 0413   HCT 24.4 (L) 09/30/2020 0413   PLT 57 (L) 09/30/2020 0413   MCV 93.1 09/30/2020 0413   MCH 29.0 09/30/2020 0413   MCHC 31.1 09/30/2020 0413   RDW 18.7 (H) 09/30/2020 0413   LYMPHSABS 0.3 (L) 09/27/2020 0427   MONOABS 0.1 09/27/2020 0427   EOSABS 0.0 09/27/2020 0427   BASOSABS 0.0 09/27/2020 0427     Assessment/Plan:  1. AKI- oliguric presumably due to ischemic ATN in setting of hemorrhagic shock. Started on CRRT 09/08/20 due to persistent oliguria and hyperkalemia.  1. All fluids 4K/2.5Ca: Pre-filter 400 ml/hr, post-filter 300 ml/hr, dialysate 1,500 ml/hr 2. UF goal 50 ml/hr as bp tolerates 3. No anticoagulation due to thrombocytopenia and ongoing GI blood loss. 4. Continue with current CRRT settings.  2. Hemorrhagic shock- ongoing blood loss and requiring pressors. 1. Transfuse as needed 3. Septic shock due to MRSA pneumonia. 4. Perforated duodenal ulcer- s/p exploratory lap and Graham patch placement per surgery. 5. Acute hypoxic/hypercapnic respiratory failure due to ARDS from Recent Covid-19 PNA- currently on vent via trach per PCCM.  6. Thrombocytopenia- follow 7. P. Atrial fibrillation- no anticoagulation due to ongoing GI bleed. 8. Vascular access- RIJ trialysis catheter 09/08/20 then removed. Left subclavian trialysis catheter placed 09/15/20 and will need new access this week. Hopefully can have [facility] given ongoing anuric renal failure (unless family wishes to continue with current level of  care). 1. Will consult IR for tunneled HD catheter once her infection clears, however will need new temp cath this week.  Discussed with PCCM. 9. Severe protein malnutrition- per primary svc. 10. Disposition- poor overall prognosis and will likely require LTC and chronic vent support. PCCM to discuss goals of care with family.  Donetta Potts, MD [employer] [phone]

## 2020-09-30 NOTE — Progress Notes (Signed)
Physical Therapy Treatment Patient Details Name: Candace [name] MRN: [id number] DOB: 12/27/1950 Today's Date: 09/30/2020    History of Present Illness 69 y.o. female with medical history significant for recent covid pna, obesity, colon cancer s/p colon resection with colostomy bag, HLD, NIDDM2, CKD3 presented to ED after her follow-up nurse advised her to present to ED for elevated HR. +afib, elevated troponins with demand ischemia,  CT scan is positive for bowel perforation with pneumomediastinum 11/03 exp lap with repair of perforated ulcer. 11/10 t/f to ICU- intubated/sedated/proned. Trach placed 11/24.    PT Comments    Pt lethargic with fentanyl increased after bathing per RN. Pt able to raise eyebrows and provide weak grip bil as only active movement during session. Pt with continued medical complexity and overall profound weakness unable to currently tolerate significant therapy or actively participate. Will decrease frequency to 1x/wk in hopes that with decreased sedation and medical improvement she will have increased participation.   124/55 supine HR 84   Follow Up Recommendations  SNF;Supervision/Assistance - 24 hour     Equipment Recommendations  Wheelchair (measurements PT);Wheelchair cushion (measurements PT);Hospital bed;Other (comment) (hoyer lift)    Recommendations for Other Services       Precautions / Restrictions Precautions Precautions: Fall Precaution Comments: baseline colostomy; trach-vent; CRRT    Mobility  Bed Mobility Overal bed mobility: Needs Assistance Bed Mobility: Rolling Rolling: Total assist;+2 for physical assistance;+2 for safety/equipment         General bed mobility comments: pt with no initiation to move arm, leg, head or trunk with cues and facilitation to roll  Transfers                    Ambulation/Gait                 Stairs             Wheelchair Mobility    Modified Rankin (Stroke Patients Only)        Balance                                            Cognition Arousal/Alertness: Lethargic;Suspect due to medications   Overall Cognitive Status: Difficult to assess Area of Impairment: Attention                   Current Attention Level: Focused           General Comments: pt responsive to squeezing hands only, will raise eyebrows and shake head when spoken to      Exercises General Exercises - Upper Extremity Elbow Flexion: PROM;Both;5 reps;Supine General Exercises - Lower Extremity Ankle Circles/Pumps: PROM;Both;10 reps;Supine Short Arc Quad: PROM;Both;Seated;10 reps Heel Slides: PROM;Both;10 reps;Supine Hip ABduction/ADduction: PROM;10 reps;Both;Supine Other Exercises Other Exercises: hip internal/external rotation PROM x 10 bil LE in supine    General Comments        Pertinent Vitals/Pain Pain Assessment:  (CPOT= 0 on fentanyl)    Home Living                      Prior Function            PT Goals (current goals can now be found in the care plan section) Progress towards PT goals: Not progressing toward goals - comment (pt limited by lethargy and lack of strength)    Frequency  Min 1X/week      PT Plan Frequency needs to be updated;Current plan remains appropriate    Co-evaluation              AM-PAC PT "6 Clicks" Mobility   Outcome Measure  Help needed turning from your back to your side while in a flat bed without using bedrails?: Total Help needed moving from lying on your back to sitting on the side of a flat bed without using bedrails?: Total Help needed moving to and from a bed to a chair (including a wheelchair)?: Total Help needed standing up from a chair using your arms (e.g., wheelchair or bedside chair)?: Total Help needed to walk in hospital room?: Total Help needed climbing 3-5 steps with a railing? : Total 6 Click Score: 6    End of Session   Activity Tolerance: Patient limited  by lethargy Patient left: in bed;with call bell/phone within reach;with nursing/sitter in room Nurse Communication: Mobility status PT Visit Diagnosis: Muscle weakness (generalized) (M62.81);Difficulty in walking, not elsewhere classified (R26.2);Pain;Adult, failure to thrive (R62.7);Unsteadiness on feet (R26.81)     Time: 1829-9371 PT Time Calculation (min) (ACUTE ONLY): 20 min  Charges:  $Therapeutic Exercise: 8-22 mins                     Mack Thurmon P, PT Acute Rehabilitation Services Pager: [phone] Office: [phone]    [name] 09/30/2020, 11:08 AM

## 2020-09-30 NOTE — Consult Note (Addendum)
WOC Nurse Consult Note: Patient receiving care in [facility] [phone] Reason for Consult: Buttock wound, abrasion cheek.  Wound type: MDRPI left cheek of face.  DTPI to sacrum with kissing lesions in the intergluteal cleft. The kissing lesions are  equivalent to stage 2 PI. DTPI to the sacrum is evolving with scattered superficial tissue loss. Due to this I will order hydrotherapy to the area. Pressure Injury POA: No Wound bed: Purple/Maroon with lesions that are red and moist. These red areas measure 1 cm x 1 cm x 0.1 cm  Drainage (amount, consistency, odor) None Dressing procedure/placement/frequency: Continue current treatment ordered for lesion on the face cheek.  Apply folded piece of Xeroform gauze [name] # 294) to gluteal fold, secure with sacral foam. Turn q2h to prevent pressure to the sacrum. Change daily. Hydrotherapy to start on 10/01/20. PT will notify [facility] nurse of appearance of wound at that time to determine if Santyl will be needed.   WOC will follow weekly. Please re-consult if needed.   Cathlean Marseilles Tamala Julian, MSN, RN, [place], Lysle Pearl, [facility] Wound Treatment Associate Pager [phone]

## 2020-10-01 DIAGNOSIS — I4891 Unspecified atrial fibrillation: Secondary | ICD-10-CM | POA: Diagnosis not present

## 2020-10-01 LAB — BLOOD CULTURE ID PANEL (REFLEXED) - BCID2

## 2020-10-01 LAB — RENAL FUNCTION PANEL
Albumin: 1.2 g/dL — ABNORMAL LOW (ref 3.5–5.0)
Albumin: 1.1 g/dL — ABNORMAL LOW (ref 3.5–5.0)
Anion gap: 9 (ref 5–15)
Anion gap: 8 (ref 5–15)
BUN: 35 mg/dL — ABNORMAL HIGH (ref 8–23)
BUN: 38 mg/dL — ABNORMAL HIGH (ref 8–23)
CO2: 23 mmol/L (ref 22–32)
CO2: 22 mmol/L (ref 22–32)
Calcium: 9.4 mg/dL (ref 8.9–10.3)
Calcium: 8 mg/dL — ABNORMAL LOW (ref 8.9–10.3)
Chloride: 102 mmol/L (ref 98–111)
Chloride: 102 mmol/L (ref 98–111)
Creatinine, Ser: 0.6 mg/dL (ref 0.44–1.00)
Creatinine, Ser: 0.58 mg/dL (ref 0.44–1.00)
GFR, Estimated: >60 mL/min (ref >60)
GFR, Estimated: >60 mL/min (ref >60)
Glucose, Bld: 257 mg/dL — ABNORMAL HIGH (ref 70–99)
Glucose, Bld: 192 mg/dL — ABNORMAL HIGH (ref 70–99)
Phosphorus: 3.8 mg/dL (ref 2.5–4.6)
Phosphorus: 3.2 mg/dL (ref 2.5–4.6)
Potassium: 5.4 mmol/L — ABNORMAL HIGH (ref 3.5–5.1)
Potassium: 4 mmol/L (ref 3.5–5.1)
Sodium: 134 mmol/L — ABNORMAL LOW (ref 135–145)
Sodium: 132 mmol/L — ABNORMAL LOW (ref 135–145)

## 2020-10-01 LAB — CBC
HCT: 22 % — ABNORMAL LOW (ref 36.0–46.0)
Hemoglobin: 6.6 g/dL — CL (ref 12.0–15.0)
MCH: 28.9 pg (ref 26.0–34.0)
MCHC: 30 g/dL (ref 30.0–36.0)
MCV: 96.5 fL (ref 80.0–100.0)
Platelets: 57 10*3/uL — ABNORMAL LOW (ref 150–400)
RBC: 2.28 MIL/uL — ABNORMAL LOW (ref 3.87–5.11)
RDW: 19.1 % — ABNORMAL HIGH (ref 11.5–15.5)
WBC: 9.9 10*3/uL (ref 4.0–10.5)
nRBC: 9.3 % — ABNORMAL HIGH (ref 0.0–0.2)

## 2020-10-01 LAB — GLUCOSE, CAPILLARY
Glucose-Capillary: 104 mg/dL — ABNORMAL HIGH (ref 70–99)
Glucose-Capillary: 131 mg/dL — ABNORMAL HIGH (ref 70–99)
Glucose-Capillary: 163 mg/dL — ABNORMAL HIGH (ref 70–99)
Glucose-Capillary: 194 mg/dL — ABNORMAL HIGH (ref 70–99)
Glucose-Capillary: 194 mg/dL — ABNORMAL HIGH (ref 70–99)
Glucose-Capillary: 217 mg/dL — ABNORMAL HIGH (ref 70–99)

## 2020-10-01 LAB — HEPATITIS B SURFACE ANTIBODY,QUALITATIVE: Hep B S Ab: NONREACTIVE

## 2020-10-01 LAB — HEPATITIS B SURFACE ANTIGEN: Hepatitis B Surface Ag: NONREACTIVE

## 2020-10-01 LAB — VITAMIN C: Vitamin C: 1.1 mg/dL (ref 0.4–2.0)

## 2020-10-01 LAB — PREPARE RBC (CROSSMATCH)

## 2020-10-01 LAB — MAGNESIUM: Magnesium: 2.4 mg/dL (ref 1.7–2.4)

## 2020-10-01 LAB — HEPATITIS B CORE ANTIBODY, TOTAL: Hep B Core Total Ab: NONREACTIVE

## 2020-10-01 MED ORDER — HEPARIN (PORCINE) 2000 UNITS/L FOR CRRT: INTRAVENOUS_CENTRAL | Status: DC | PRN

## 2020-10-01 MED ORDER — INSULIN GLARGINE 100 UNIT/ML ~~LOC~~ SOLN
25.0000 [IU] | Freq: Every day | SUBCUTANEOUS | Status: DC
  Administered 2020-10-01 – 2020-10-14 (×14): 25 [IU] via SUBCUTANEOUS
  Filled 2020-10-01 (×15): qty 0.25

## 2020-10-01 MED ORDER — FENTANYL CITRATE (PF) 100 MCG/2ML IJ SOLN
50.0000 ug | INTRAMUSCULAR | Status: DC | PRN
  Administered 2020-10-01 – 2020-10-04 (×10): 50 ug via INTRAVENOUS
  Filled 2020-10-01 (×9): qty 2

## 2020-10-01 MED ORDER — PRISMASOL BGK 4/2.5 32-4-2.5 MEQ/L EC SOLN
Status: DC
  Filled 2020-10-01 (×5): qty 5000

## 2020-10-01 MED ORDER — PRISMASOL BGK 4/2.5 32-4-2.5 MEQ/L REPLACEMENT SOLN
Status: DC
  Filled 2020-10-01 (×2): qty 5000

## 2020-10-01 MED ORDER — SODIUM CHLORIDE 0.9% IV SOLUTION: Freq: Once | INTRAVENOUS | Status: DC

## 2020-10-01 MED ORDER — PRISMASOL BGK 4/2.5 32-4-2.5 MEQ/L REPLACEMENT SOLN
Status: DC
  Filled 2020-10-01 (×8): qty 5000

## 2020-10-01 MED ORDER — PRISMASOL BGK 4/2.5 32-4-2.5 MEQ/L REPLACEMENT SOLN
Status: DC
  Filled 2020-10-01 (×14): qty 5000

## 2020-10-01 MED ORDER — PRISMASOL BGK 4/2.5 32-4-2.5 MEQ/L REPLACEMENT SOLN
Status: DC
  Filled 2020-10-01: qty 5000

## 2020-10-01 MED ORDER — PRISMASOL BGK 4/2.5 32-4-2.5 MEQ/L EC SOLN
Status: DC
  Filled 2020-10-01 (×41): qty 5000

## 2020-10-01 MED ORDER — SODIUM ZIRCONIUM CYCLOSILICATE 10 G PO PACK
10.0000 g | PACK | Freq: Once | ORAL | Status: AC
  Administered 2020-10-01: 10 g
  Filled 2020-10-01: qty 1

## 2020-10-01 NOTE — Consult Note (Signed)
[facility] Nurse wound follow up PT evaluated the sacral DTPI for hydrotherapy. The order will be discontinued as it is not needed at this time. Continue previous orders: Apply folded piece of Xeroform gauze [name] # 294) to gluteal fold, secure with sacral foam. Turn q2h to prevent pressure to the sacrum. Change daily. Cathlean Marseilles Tamala Julian, MSN, RN, [place], Lysle Pearl, [facility] Wound Treatment Associate Pager [phone]

## 2020-10-01 NOTE — Progress Notes (Signed)
NAMEAracelia [name], MRN:  [id number], DOB:  1951-01-06, LOS: 28 ADMISSION DATE:  08/31/2020, CONSULTATION DATE:  09/07/2020 REFERRING MD:  Dr Candiss Norse, CHIEF COMPLAINT:  Acute resp failure  Brief History   69 year old female who was previously diagnosed with Covid 08/23/2020.  Admitted 11/2 with AF-RVR, found to have a perforated duodenal underwent exploratory laparotomy with Phillip Heal patch placement.  Past Medical History  Covid pneumonia Atrial fibrillation CKD stage III Diabetes mellitus Hypertension Colon cancer Hyperlipidemia  Significant Hospital Events   11/2 Admitted  11/3 OR -> perforated duodenal ulcer 11/10 progressive hemorrhagic shock, intubated, transfused, pressors, proned; started on CRRT in PM 11/16 Extubated. Re-intubated overnight due to respiratory distress and hypoxia with decreased mentation 11/18  bronch'd/ cultures sent 11/19 hgb down getting blood 09/22/2020 spiked fever resume empirical antimicrobial therapy 11/26: hemorrhagic shock, hgb 5.6, increased pressors, CT A/P   Consults:  Cardiology [facility] Nephrology   Procedures:  11/3 Exploratory laparotomy, Phillip Heal patch, lysis of adhesion for duodenal ulceration postop day 6  R PICC 11/5 >> A line 11/9 >> out ETT 11/9 > 11/16, 11/16 >> 09/21/2020 09/21/2020 tracheostomy>> Lt [place] CVL 11/9 >> R IJ trialysis >> out HD catheter 12/1 >>   Significant Diagnostic Tests:  11/3 CT abd/ pelvis > 1. Positive for bowel perforation: Pneumoperitoneum and intermediate density free fluid in the abdomen. Prior total colectomy. The specific site of perforation is unclear-oral contrast present to the proximal jejunum has not obviously leaked. Note that there may be small bowel loops adherent to the ventral abdominal wall along the greater curve of the stomach. 2. Extensive bilateral lower lung pneumonia. No pleural effusion. 3. Other abdominal and pelvic viscera are stable since 2015, including bilateral adrenal adenomas.  Chronic renal parapelvic cysts. 4. Aortic Atherosclerosis 11/3 TTE > EF 70-75%, RV not well visualized, mildly reduced RV systolic function 11/94 CT chest/ abd/ pelvis> 1. Interval progression of diffuse bilateral hazy ground-glass airspace opacities with more focal areas of consolidation at the lung bases 2. Trace bilateral pleural effusions. 3. Postsurgical changes the abdomen as detailed above. No evidence for a postoperative abscess, however evaluation is limited by lack of IV contrast. 4. There is a 1.9 cm cystic appearing lesion located in the pancreatic body. This was not present on the patient's CT from 2015.  Follow-up with an outpatient contrast enhanced MRI is recommended. 5. The endometrial stripe appears diffusely thickened. Follow-up with pelvic ultrasound is recommended. Aortic Atherosclerosis 11/14 LE doppler studies > + DVT of right posterior tibial and peroneal vein, +dVT of left posterior tibial vein  11/26 CT ABD PEL > liver unremarkable, distended gallbladder with layering tiny gallstones versus sludge, no duct dilatation, mild hyperdensity right upper pole renal collecting system new.  No evidence of retroperitoneal bleeding.  Multifocal lower lobe predominant pulmonary infiltrates/pneumonia.  Small left pleural effusion.  Micro Data:  11/10 MRSA PCR > neg 11/10 BC x 2 > neg 09/22/2020 blood cultures x2>> S epi.  09/22/2020 sputum culture>> MRSA Blood 12/1 >>    Antimicrobials:  azithro 11/2 >11/3 Ceftriaxone 11/2  Fluconazole 11/3 Zosyn 11/3 >> 11/7 Vanc 11/10 off Cefepime 11/10 > 11/16 09/22/2020 vancomycin for MRSA PNA >> 12/2   Subjective:   Still on high dose pressors. Trialed IHD and could not tolerate. On fentanyl drip and RASS -3.  Objective   Blood pressure 117/60, pulse (!) 121, temperature 98.6 F (37 C), temperature source Oral, resp. rate (!) 26, height 5\' 8"  (1.727 m), weight 84.5 kg, SpO2 99 %.  CVP:  [0 mmHg-12 mmHg] 3 mmHg  Vent Mode:  PRVC FiO2 (%):  [50 %-60 %] 50 % Set Rate:  [26 bmp] 26 bmp Vt Set:  [510 mL] 510 mL PEEP:  [5 cmH20] 5 cmH20 Plateau Pressure:  [27 cmH20-32 cmH20] 27 cmH20   Intake/Output Summary (Last 24 hours) at 10/01/2020 1011 Last data filed at 10/01/2020 1003 Gross per 24 hour  Intake 2192.74 ml  Output 1474 ml  Net 718.74 ml   Filed Weights   09/27/20 0424 09/28/20 0420 09/29/20 0500  Weight: 85 kg 84.1 kg 84.5 kg   Physical Exam: General: Elderly woman, acute and chronically ill, ventilated via tracheostomy HENT: Oropharynx moist, pupils equal, trach site looks good, no secretions Eyes: Equal Respiratory: Coarse bilaterally, no wheezes or crackles Cardiovascular: Regular, 90s, distant, no murmur GI: Nondistended, positive bowel sounds, ileostomy well-appearing Extremities: Bilateral upper and lower extremity edema, anasarca Neuro: RASS-3, does not follow commands   Assessment & Plan:   Hemorrhagic shock  Acute blood loss anemia  Previously hgb 5.6 on 11/26 >> 8.0 on 11/19 Plan  -1u PRBBC 12/3.  Continue to follow CBC  Sedation needs, anxiety. Persistent need fentanyl infusion. Respiratory dyssynchrony, cough with decreased -Schedule oxycodone every 6 hours, PRN fentanyl puses -Seroquel added on 11/30 -Add clonazepam on 12/2 -Stop fentanyl infusion, do not resume  Acute hypoxic/hypercapnic respiratory failure due to ARDS from COVID-19 pneumonia c/b HCAP and failure to wean and failed extubation 11/16 Plan -Concerning that she will not be able to fully for mechanical ventilation. Does intermittently tolerate some high pressure support.  This would be a long-term wean or possibly permanent vent dependence. Family considering going home with a home ventilator.  Her options are likely include LTAC for vent SNF, although not entirely clear this is consistent with patient's wishes.  Palliative care input appreciated -Continue to wean FiO2  Multifactorial shock, previously hemorrhagic  as above, Septic shock, MRSA pneumonia, treated for 8 days 1/4 bottles BCX with Meth R Staph Epi, likely contaminant Plan -Continue to wean norepinephrine as able.  MAP goal 60. -Plan to stop vasopressin when norepinephrine needs decrease below 10 -Continue midodrine, started 11/30 -Completed full course of vancomycin 12/2 for MRSA pneumonia.  -Blood cultures 12/1 NGTD  Bradycardia AFRVR - currently in NSR -Atropine at bedside -Overall labile status, attempting to aggressively manage electrolytes  Anuric AKI on CRRT Plan -Appreciate nephrology management of her CVVHD -Would like to transition to intermittent HD but unable to do so due to shock state, pressor needs.  Suspect there will be no improvement.  Duodenal ulcer perforation status post ex lap and Graham's patch Plan -Appreciate CTS assistance and management -Ultimately needs a PEG tube if were going to continue aggressive course of care.  Unable to do so currently because gastric insufflation would put her duodenal patch at risk.  We will proceed with an okay with [facility]CS  Paroxysmal A. fib, currently in sinus rhythm Plan -Continue telemetry monitoring -Hold off on restarting anticoagulation given GI bleeding this admission  Thrombocytopenia - Plan -Continue to follow CBC  Glucose control - no recent hypogoglycemia Plan -Lantus 25 daily -Sliding scale insulin as ordered  1.9 cm cystic appearing lesion located in the pancreatic body Plan -No intervention at this time, plan outpatient follow-up  History of stage IV colon cancer Status post pulmonary metastatectomies at [facility] + Chemo -No intervention at this time  Indeterminate bilateral DVT Patient had bilateral lower extremity Dopplers done, suggestive of possible indeterminate DVT, spoke with  IR for IVC filter placement, after reevaluating ultrasound of lower extremities it was suggested patient does not have femoral/popliteal DVTs, so IVC filter was not  placed Plan: -Currently following off anticoagulation  Best practice:  Diet: Tube feeds Pain/Anxiety/Delirium protocol (if indicated) fentanyl drip, oxycodone per tube, clonazepam VAP protocol (if indicated): yes DVT prophylaxis: SCDs GI prophylaxis: Protonix Glucose control: lantus + ssi  Mobility: Bedrest Code Status: Full Family Communication: Palliative care discussed [place] 11/30. Confirming full code.   Disposition: ICU  CRITICAL CARE Performed by: Bonna Gains Suzanne Kho   Total critical care time: 40 minutes  Critical care time was exclusive of separately billable procedures and treating other patients.  Critical care was necessary to treat or prevent imminent or life-threatening deterioration.  Critical care was time spent personally by me on the following activities: development of treatment plan with patient and/or surrogate as well as nursing, discussions with consultants, evaluation of patient's response to treatment, examination of patient, obtaining history from patient or surrogate, ordering and performing treatments and interventions, ordering and review of laboratory studies, ordering and review of radiographic studies, pulse oximetry and re-evaluation of patient's condition.

## 2020-10-01 NOTE — Progress Notes (Signed)
Hydrotherapy Note  Order received for hydrotherapy for sacral DTPI. Area inspected and found to have deep purple area with some broken surface. At this point hydrotherapy not appropriate. Wound needs to evolve to definite areas of eschar prior to any attempt at debridement. Signing off. Please reorder when appropriate.       [place] Pager [phone] Office [phone]

## 2020-10-01 NOTE — Progress Notes (Signed)
Patient ID: Candace [name], female   DOB: [birth year], 69 y.o.   MRN: [id number] S: Taken off of CRRT at midnight and K quickly rose to 5.4 and attempted IHD this morning however she immediately developed hypotension and tachycardia. O:BP 117/60 (BP Location: Right Arm)   Pulse (!) 121   Temp 98.6 F (37 C) (Oral)   Resp (!) 26   Ht 5' 8"  (1.727 m)   Wt 84.5 kg   SpO2 99%   BMI 28.33 kg/m   Intake/Output Summary (Last 24 hours) at 10/01/2020 0951 Last data filed at 10/01/2020 0829 Gross per 24 hour  Intake 2275.71 ml  Output 1560 ml  Net 715.71 ml   Intake/Output: I/O last 3 completed shifts: In: 3383.2 [I.V.:1163.2; NG/GT:2220] Out: 2637 [Other:2794; Stool:950]  Intake/Output this shift:  Total I/O In: 389.4 [I.V.:32.8; Blood:236.7; NG/GT:120] Out: -296 [Stool:130] Weight change:  Gen: on vent via trach CVS: tachy at 121 Resp: scattered rhocnhi Abd: +BS, soft Ext: 1+ edema of upper extremities  Recent Labs  Lab 09/27/20 1607 09/27/20 1607 09/28/20 0429 09/28/20 1526 09/28/20 1807 09/28/20 1807 09/28/20 2216 09/28/20 2232 09/29/20 0518 09/29/20 1550 09/29/20 1600 09/30/20 0413 10/01/20 0350  NA 130*   < > 130*   < > 131*   < > 134* 136 135 136 136 134* 134*  K 5.9*   < > 3.9   < > 4.8   < > 4.1 4.1 4.5 4.5 4.5 4.6 5.4*  CL 97*   < > 101  --  97*  --  103  --  102  --  103 102 102  CO2 23   < > 24  --  22  --  23  --  23  --  23 22 23   GLUCOSE 336*   < > 85  --  311*  --  157*  --  143*  --  128* 332* 257*  BUN 24*   < > 23  --  28*  --  23  --  23  --  25* 29* 35*  CREATININE 0.54   < > 0.38*  --  0.55  --  0.37*  --  0.38*  --  0.46 0.54 0.60  ALBUMIN 1.6*  --  1.6*  --  1.5*  --   --   --  1.4*  --  1.3* 1.2* 1.2*  CALCIUM 8.4*   < > 8.9  --  9.4  --  8.8*  --  9.4  --  9.8 8.8* 9.4  PHOS 6.9*  --  2.2*  --  4.9*  --   --   --  3.2  --  3.4 2.6 3.8   < > = values in this interval not displayed.   Liver Function Tests: Recent Labs  Lab 09/29/20 1600  09/30/20 0413 10/01/20 0350  ALBUMIN 1.3* 1.2* 1.2*   No results for input(s): LIPASE, AMYLASE in the last 168 hours. No results for input(s): AMMONIA in the last 168 hours. CBC: Recent Labs  Lab 09/27/20 0427 09/28/20 0428 09/28/20 2240 09/28/20 2240 09/29/20 0518 09/29/20 0518 09/29/20 1400 09/29/20 1400 09/29/20 1550 09/30/20 0413 10/01/20 0350  WBC 10.8*   < > 8.7   < > 10.9*   < > 13.2*  --   --  11.5* 9.9  NEUTROABS 9.9*  --   --   --   --   --   --   --   --   --   --  HGB 8.0*   < > 6.8*   < > 8.8*   < > 8.9*   < > 9.2* 7.6* 6.6*  HCT 25.9*   < > 22.2*   < > 27.9*   < > 27.5*   < > 27.0* 24.4* 22.0*  MCV 92.5   < > 91.4  --  92.1  --  91.1  --   --  93.1 96.5  PLT 77*   < > 61*   < > 79*   < > 64*  --   --  57* 57*   < > = values in this interval not displayed.   Cardiac Enzymes: No results for input(s): CKTOTAL, CKMB, CKMBINDEX, TROPONINI in the last 168 hours. CBG: Recent Labs  Lab 09/30/20 1632 09/30/20 1943 09/30/20 2335 10/01/20 0335 10/01/20 0807  GLUCAP 224* 175* 129* 194* 194*    Iron Studies: No results for input(s): IRON, TIBC, TRANSFERRIN, FERRITIN in the last 72 hours. Studies/Results: DG CHEST PORT 1 VIEW  Result Date: 09/29/2020 CLINICAL DATA:  Central line placement EXAM: PORTABLE CHEST 1 VIEW COMPARISON:  Chest radiograph from one day prior. FINDINGS: Tracheostomy tube tip overlies the tracheal air column just below the thoracic inlet. Right PICC terminates over the middle third of the SVC. New right subclavian central venous catheter terminates over the lower third of the SVC. Enteric tube enters stomach with the tip not seen on this image. Stable cardiomediastinal silhouette with normal heart size. No pneumothorax. Probable stable small bilateral pleural effusions. Extensive patchy consolidation in the mid to lower lungs bilaterally, not substantially changed. IMPRESSION: New right subclavian central venous catheter terminates over the lower  third of the SVC. No pneumothorax. Otherwise stable well-positioned support structures. Stable small bilateral pleural effusions and extensive patchy consolidation in the mid to lower lungs bilaterally. Electronically Signed   By: Ilona Sorrel M.D.   On: 09/29/2020 17:35   . sodium chloride   Intravenous Once  . B-complex with vitamin C  1 tablet Per Tube Daily  . chlorhexidine gluconate (MEDLINE KIT)  15 mL Mouth Rinse BID  . Chlorhexidine Gluconate Cloth  6 each Topical Daily  . chlorpheniramine-HYDROcodone  5 mL Per Tube Q12H  . clonazepam  1 mg Per Tube BID  . darbepoetin (ARANESP) injection - DIALYSIS  60 mcg Subcutaneous Q Tue-1800  . feeding supplement (PROSource TF)  90 mL Per Tube TID  . insulin aspart  0-20 Units Subcutaneous Q4H  . insulin aspart  8 Units Subcutaneous Q4H  . insulin glargine  25 Units Subcutaneous Daily  . mouth rinse  15 mL Mouth Rinse QID  . midodrine  10 mg Per Tube TID WC  . oxyCODONE  10 mg Per Tube Q6H  . pantoprazole sodium  40 mg Per Tube BID  . QUEtiapine  100 mg Per Tube BID  . sodium chloride flush  10-40 mL Intracatheter Q12H    BMET    Component Value Date/Time   NA 134 (L) 10/01/2020 0350   K 5.4 (H) 10/01/2020 0350   CL 102 10/01/2020 0350   CO2 23 10/01/2020 0350   GLUCOSE 257 (H) 10/01/2020 0350   BUN 35 (H) 10/01/2020 0350   CREATININE 0.60 10/01/2020 0350   CALCIUM 9.4 10/01/2020 0350   GFRNONAA >60 10/01/2020 0350   CBC    Component Value Date/Time   WBC 9.9 10/01/2020 0350   RBC 2.28 (L) 10/01/2020 0350   HGB 6.6 (LL) 10/01/2020 0350   HCT 22.0 (L)  10/01/2020 0350   PLT 57 (L) 10/01/2020 0350   MCV 96.5 10/01/2020 0350   MCH 28.9 10/01/2020 0350   MCHC 30.0 10/01/2020 0350   RDW 19.1 (H) 10/01/2020 0350   LYMPHSABS 0.3 (L) 09/27/2020 0427   MONOABS 0.1 09/27/2020 0427   EOSABS 0.0 09/27/2020 0427   BASOSABS 0.0 09/27/2020 0427     Assessment/Plan:  1. AKI- oliguric presumably due to ischemic ATN in setting of  hemorrhagic shock. Started on CRRT 09/08/20 due to persistent oliguria and hyperkalemia.  Failed attempt at IHD this morning.  Will resume CRRT today with following orders: 1. All fluids 4K/2.5Ca: Pre-filter 400 ml/hr, post-filter 300 ml/hr, dialysate 1,500 ml/hr 2. UF goal 50 ml/hr as bp tolerates 3. No anticoagulation due to thrombocytopenia and ongoing GI blood loss. 4. Continue with current CRRT settings.  2. Hemorrhagic shock- ongoing blood loss and requiring pressors. 1. Transfuse as needed 3. Septic shock due to MRSA pneumonia. 4. Perforated duodenal ulcer- s/p exploratory lap and Graham patch placement per surgery. 5. Acute hypoxic/hypercapnic respiratory failure due to ARDS from Recent Covid-19 PNA- currently on vent via trach per PCCM.  6. Thrombocytopenia- follow 7. P. Atrial fibrillation- no anticoagulation due to ongoing GI bleed. 8. Vascular access- RIJ trialysis catheter 09/08/20 then removed. Left subclavian trialysis catheter placed 09/15/20 and removed and new right [place] HD catheter placed on 09/29/20. 1. Holding off on [facility] due to ongoing infection.  9. Severe protein malnutrition- per primary svc. 10. Disposition- poor overall prognosis and will likely require LTC and chronic vent support. PCCM to discuss goals of care with family.  Donetta Potts, MD [employer] [phone]

## 2020-10-01 NOTE — Progress Notes (Signed)
PHARMACY - PHYSICIAN COMMUNICATION CRITICAL VALUE ALERT - BLOOD CULTURE IDENTIFICATION (BCID)  Candace [name] is an 69 y.o. female who presented to [facility] on 08/31/2020 with a chief complaint of ARF 2/2 COVID.  Assessment: Patient with 1 bcx bottle from 12/1 growing MRSE. Of note, previously grew MRSE in 1 bottle on 11/24.  Name of physician (or Provider) Contacted: [place]  Current antibiotics: none  Changes to prescribed antibiotics recommended:  Not on antimicrobials, no changes required at this time, could represent contamination  Results for orders placed or performed during the hospital encounter of 08/31/20  Blood Culture ID Panel (Reflexed) (Collected: 09/29/2020  5:51 PM)  Result Value Ref Range   Enterococcus faecalis NOT DETECTED NOT DETECTED   Enterococcus Faecium NOT DETECTED NOT DETECTED   Listeria monocytogenes NOT DETECTED NOT DETECTED   Staphylococcus species DETECTED (A) NOT DETECTED   Staphylococcus aureus (BCID) NOT DETECTED NOT DETECTED   Staphylococcus epidermidis DETECTED (A) NOT DETECTED   Staphylococcus lugdunensis NOT DETECTED NOT DETECTED   Streptococcus species NOT DETECTED NOT DETECTED   Streptococcus agalactiae NOT DETECTED NOT DETECTED   Streptococcus pneumoniae NOT DETECTED NOT DETECTED   Streptococcus pyogenes NOT DETECTED NOT DETECTED   A.calcoaceticus-baumannii NOT DETECTED NOT DETECTED   Bacteroides fragilis NOT DETECTED NOT DETECTED   Enterobacterales NOT DETECTED NOT DETECTED   Enterobacter cloacae complex NOT DETECTED NOT DETECTED   Escherichia coli NOT DETECTED NOT DETECTED   Klebsiella aerogenes NOT DETECTED NOT DETECTED   Klebsiella oxytoca NOT DETECTED NOT DETECTED   Klebsiella pneumoniae NOT DETECTED NOT DETECTED   Proteus species NOT DETECTED NOT DETECTED   Salmonella species NOT DETECTED NOT DETECTED   Serratia marcescens NOT DETECTED NOT DETECTED   Haemophilus influenzae NOT DETECTED NOT DETECTED   Neisseria meningitidis NOT  DETECTED NOT DETECTED   Pseudomonas aeruginosa NOT DETECTED NOT DETECTED   Stenotrophomonas maltophilia NOT DETECTED NOT DETECTED   Candida albicans NOT DETECTED NOT DETECTED   Candida auris NOT DETECTED NOT DETECTED   Candida glabrata NOT DETECTED NOT DETECTED   Candida krusei NOT DETECTED NOT DETECTED   Candida parapsilosis NOT DETECTED NOT DETECTED   Candida tropicalis NOT DETECTED NOT DETECTED   Cryptococcus neoformans/gattii NOT DETECTED NOT DETECTED   Methicillin resistance mecA/C DETECTED (A) NOT DETECTED    Esmond Plants 10/01/2020  11:48 AM

## 2020-10-01 NOTE — Progress Notes (Signed)
Notified Patel MD with Nephrology that CRRT had been stopped at midnight. Reported potassium of 5.4 to Posey Pronto MD and Oletta Darter MD. Nephrology team intends to see patient soon to consider iHD. RN to give [facility] for hyperkalemia now per Posey Pronto MD and Oletta Darter MD.

## 2020-10-01 NOTE — Progress Notes (Signed)
Patient is receiving BP support. Hemodialysis treatment ended due to drop in BP to the 06'Y then 50 systolic within minutes of treatment start. MD Posey Pronto made aware.

## 2020-10-01 NOTE — Progress Notes (Signed)
eLink Physician-Brief Progress Note Patient Name: Candace [name] DOB: [birth year] MRN: [id number]   Date of Service  10/01/2020  HPI/Events of Note  Multiple issues: 1. Hyperkalemia - K+ = 5.4 and 2. Anemia -Hgb = 6.6.   eICU Interventions  Plan: 1. Lokelma 10 gm per tube now.  2. Repeat BMP at 11 AM. 3. Nursing to inform nephrology service of K+ result. 4. Transfuse 1 unit PRBC now.      Intervention Category Major Interventions: Electrolyte abnormality - evaluation and management  Donielle Radziewicz [place] 10/01/2020, 4:58 AM

## 2020-10-02 DIAGNOSIS — I4891 Unspecified atrial fibrillation: Secondary | ICD-10-CM | POA: Diagnosis not present

## 2020-10-02 LAB — BPAM RBC
Blood Product Expiration Date: 202112232359
Blood Product Expiration Date: 202112252359
ISSUE DATE / TIME: 202112010135
ISSUE DATE / TIME: 202112030521
Unit Type and Rh: 7300
Unit Type and Rh: 7300

## 2020-10-02 LAB — GLUCOSE, CAPILLARY
Glucose-Capillary: 146 mg/dL — ABNORMAL HIGH (ref 70–99)
Glucose-Capillary: 218 mg/dL — ABNORMAL HIGH (ref 70–99)
Glucose-Capillary: 221 mg/dL — ABNORMAL HIGH (ref 70–99)
Glucose-Capillary: 119 mg/dL — ABNORMAL HIGH (ref 70–99)
Glucose-Capillary: 94 mg/dL (ref 70–99)
Glucose-Capillary: 137 mg/dL — ABNORMAL HIGH (ref 70–99)

## 2020-10-02 LAB — RENAL FUNCTION PANEL
Albumin: 1.1 g/dL — ABNORMAL LOW (ref 3.5–5.0)
Albumin: 1.1 g/dL — ABNORMAL LOW (ref 3.5–5.0)
Anion gap: 10 (ref 5–15)
Anion gap: 7 (ref 5–15)
BUN: 31 mg/dL — ABNORMAL HIGH (ref 8–23)
BUN: 29 mg/dL — ABNORMAL HIGH (ref 8–23)
CO2: 25 mmol/L (ref 22–32)
CO2: 24 mmol/L (ref 22–32)
Calcium: 8.8 mg/dL — ABNORMAL LOW (ref 8.9–10.3)
Calcium: 8.4 mg/dL — ABNORMAL LOW (ref 8.9–10.3)
Chloride: 102 mmol/L (ref 98–111)
Chloride: 106 mmol/L (ref 98–111)
Creatinine, Ser: 0.46 mg/dL (ref 0.44–1.00)
Creatinine, Ser: 0.41 mg/dL — ABNORMAL LOW (ref 0.44–1.00)
GFR, Estimated: >60 mL/min (ref >60)
GFR, Estimated: >60 mL/min (ref >60)
Glucose, Bld: 202 mg/dL — ABNORMAL HIGH (ref 70–99)
Glucose, Bld: 127 mg/dL — ABNORMAL HIGH (ref 70–99)
Phosphorus: 3.4 mg/dL (ref 2.5–4.6)
Phosphorus: 2.8 mg/dL (ref 2.5–4.6)
Potassium: 5.1 mmol/L (ref 3.5–5.1)
Potassium: 4.5 mmol/L (ref 3.5–5.1)
Sodium: 137 mmol/L (ref 135–145)
Sodium: 137 mmol/L (ref 135–145)

## 2020-10-02 LAB — TYPE AND SCREEN
ABO/RH(D): B POS
Antibody Screen: NEGATIVE
Unit division: 0
Unit division: 0

## 2020-10-02 LAB — BLOOD GAS, VENOUS
Acid-base deficit: 0.9 mmol/L (ref 0.0–2.0)
Bicarbonate: 24 mmol/L (ref 20.0–28.0)
O2 Saturation: 67.5 %
Patient temperature: 37
pCO2, Ven: 44.8 mmHg (ref 44.0–60.0)
pH, Ven: 7.348 (ref 7.250–7.430)

## 2020-10-02 LAB — MAGNESIUM: Magnesium: 2.3 mg/dL (ref 1.7–2.4)

## 2020-10-02 NOTE — Progress Notes (Signed)
NAMEMilta [name], MRN:  [id number], DOB:  18-Dec-1950, LOS: 77 ADMISSION DATE:  08/31/2020, CONSULTATION DATE:  09/07/2020 REFERRING MD:  Dr Candiss Norse, CHIEF COMPLAINT:  Acute resp failure  Brief History   69 year old female who was previously diagnosed with Covid 08/23/2020.  Admitted 11/2 with AF-RVR, found to have a perforated duodenal underwent exploratory laparotomy with Phillip Heal patch placement.  Past Medical History  Covid pneumonia Atrial fibrillation CKD stage III Diabetes mellitus Hypertension Colon cancer Hyperlipidemia  Significant Hospital Events   11/2 Admitted  11/3 OR -> perforated duodenal ulcer 11/10 progressive hemorrhagic shock, intubated, transfused, pressors, proned; started on CRRT in PM 11/16 Extubated. Re-intubated overnight due to respiratory distress and hypoxia with decreased mentation 11/18  bronch'd/ cultures sent 11/19 hgb down getting blood 09/22/2020 spiked fever resume empirical antimicrobial therapy 11/26: hemorrhagic shock, hgb 5.6, increased pressors, CT A/P   Consults:  Cardiology [facility] Nephrology   Procedures:  11/3 Exploratory laparotomy, Phillip Heal patch, lysis of adhesion for duodenal ulceration postop day 6  R PICC 11/5 >> A line 11/9 >> out ETT 11/9 > 11/16, 11/16 >> 09/21/2020 09/21/2020 tracheostomy>> Lt [place] CVL 11/9 >> R IJ trialysis >> out HD catheter 12/1 >>   Significant Diagnostic Tests:  11/3 CT abd/ pelvis > 1. Positive for bowel perforation: Pneumoperitoneum and intermediate density free fluid in the abdomen. Prior total colectomy. The specific site of perforation is unclear-oral contrast present to the proximal jejunum has not obviously leaked. Note that there may be small bowel loops adherent to the ventral abdominal wall along the greater curve of the stomach. 2. Extensive bilateral lower lung pneumonia. No pleural effusion. 3. Other abdominal and pelvic viscera are stable since 2015, including bilateral adrenal adenomas.  Chronic renal parapelvic cysts. 4. Aortic Atherosclerosis 11/3 TTE > EF 70-75%, RV not well visualized, mildly reduced RV systolic function 98/92 CT chest/ abd/ pelvis> 1. Interval progression of diffuse bilateral hazy ground-glass airspace opacities with more focal areas of consolidation at the lung bases 2. Trace bilateral pleural effusions. 3. Postsurgical changes the abdomen as detailed above. No evidence for a postoperative abscess, however evaluation is limited by lack of IV contrast. 4. There is a 1.9 cm cystic appearing lesion located in the pancreatic body. This was not present on the patient's CT from 2015.  Follow-up with an outpatient contrast enhanced MRI is recommended. 5. The endometrial stripe appears diffusely thickened. Follow-up with pelvic ultrasound is recommended. Aortic Atherosclerosis 11/14 LE doppler studies > + DVT of right posterior tibial and peroneal vein, +dVT of left posterior tibial vein  11/26 CT ABD PEL > liver unremarkable, distended gallbladder with layering tiny gallstones versus sludge, no duct dilatation, mild hyperdensity right upper pole renal collecting system new.  No evidence of retroperitoneal bleeding.  Multifocal lower lobe predominant pulmonary infiltrates/pneumonia.  Small left pleural effusion.  Micro Data:  11/10 MRSA PCR > neg 11/10 BC x 2 > neg 09/22/2020 blood cultures x2>> S epi.  09/22/2020 sputum culture>> MRSA Blood 12/1 >>    Antimicrobials:  azithro 11/2 >11/3 Ceftriaxone 11/2  Fluconazole 11/3 Zosyn 11/3 >> 11/7 Vanc 11/10 off Cefepime 11/10 > 11/16 09/22/2020 vancomycin for MRSA PNA >> 12/2   Subjective:   Eyes open this morning, attends. No narcotic or sedative drips. Pressors down a bit.  Objective   Blood pressure 100/63, pulse 83, temperature 97.6 F (36.4 C), temperature source Oral, resp. rate (!) 26, height 5\' 8"  (1.727 m), weight 86.6 kg, SpO2 100 %. CVP:  [10  mmHg-13 mmHg] 10 mmHg  Vent Mode: PRVC FiO2 (%):   [40 %-50 %] 40 % Set Rate:  [26 bmp] 26 bmp Vt Set:  [510 mL] 510 mL PEEP:  [5 cmH20] 5 cmH20 Pressure Support:  [15 cmH20] 15 cmH20 Plateau Pressure:  [26 cmH20-30 cmH20] 28 cmH20   Intake/Output Summary (Last 24 hours) at 10/02/2020 0955 Last data filed at 10/02/2020 0900 Gross per 24 hour  Intake 2646.74 ml  Output 2168 ml  Net 478.74 ml   Filed Weights   09/28/20 0420 09/29/20 0500 10/01/20 1210  Weight: 84.1 kg 84.5 kg 86.6 kg   Physical Exam: General: Elderly woman, acute and chronically ill, ventilated via tracheostomy HENT: Oropharynx moist, pupils equal, trach site looks good, no secretions Eyes: Equal Respiratory: Coarse bilaterally, no wheezes or crackles Cardiovascular: Regular, 90s, distant, no murmur GI: Nondistended, positive bowel sounds, ileostomy well-appearing Extremities: Bilateral upper and lower extremity edema, anasarca Neuro: eyes open, attends, inattentive   Assessment & Plan:   Hemorrhagic shock  Acute blood loss anemia  Plan  -1u PRBBC 12/3.  Continue to follow CBC  Sedation needs, anxiety. Persistent need fentanyl infusion. Respiratory dyssynchrony, cough with decreased -Schedule oxycodone every 6 hours, PRN fentanyl puses -Seroquel added on 11/30 -Add clonazepam on 12/2 -avoid infusions  Acute hypoxic/hypercapnic respiratory failure due to ARDS from COVID-19 pneumonia c/b HCAP and failure to wean and failed extubation 11/16 Plan -Concerning that she will not be able to fully for mechanical ventilation. Does intermittently tolerate some high pressure support.  This would be a long-term wean or possibly permanent vent dependence. Family considering going home with a home ventilator.  Her options are likely include LTAC for vent SNF, although not entirely clear this is consistent with patient's wishes.  Palliative care input appreciated -Continue to wean FiO2  Multifactorial shock, previously hemorrhagic as above, Septic shock, MRSA pneumonia,  treated for 8 days 1/4 bottles BCX with Meth R Staph Epi, likely contaminant Plan -Continue to wean norepinephrine as able.  MAP goal 60. -Plan to stop vasopressin when norepinephrine needs decrease below 10 -Continue midodrine, started 11/30 -Completed full course of vancomycin 12/2 for MRSA pneumonia.  -Blood cultures 12/1 1 of 4 bottles S epi, contaminant  Bradycardia AFRVR - currently in NSR -Atropine at bedside -Overall labile status, attempting to aggressively manage electrolytes  Anuric AKI on CRRT Plan -Appreciate nephrology management of her CVVHD -Would like to transition to intermittent HD but unable to do so due to shock state, pressor needs.  Suspect there will be no improvement.  Duodenal ulcer perforation status post ex lap and Graham's patch Plan -Appreciate CTS assistance and management -Ultimately needs a PEG tube if were going to continue aggressive course of care.  Unable to do so currently because gastric insufflation would put her duodenal patch at risk.  We will proceed with an okay with [facility]  Paroxysmal A. fib, currently in sinus rhythm Plan -Continue telemetry monitoring -Hold off on restarting anticoagulation given GI bleeding this admission  Thrombocytopenia - Plan -Continue to follow CBC  Glucose control - no recent hypogoglycemia Plan -Lantus  -Sliding scale insulin   1.9 cm cystic appearing lesion located in the pancreatic body Plan -No intervention at this time, plan outpatient follow-up  History of stage IV colon cancer Status post pulmonary metastatectomies at [facility] + Chemo -No intervention at this time  Indeterminate bilateral DVT Patient had bilateral lower extremity Dopplers done, suggestive of possible indeterminate DVT, spoke with IR for IVC filter placement, after  reevaluating ultrasound of lower extremities it was suggested patient does not have femoral/popliteal DVTs, so IVC filter was not placed Plan: -Currently following off  anticoagulation  Best practice:  Diet: Tube feeds Pain/Anxiety/Delirium protocol (if indicated) fentanyl drip, oxycodone per tube, clonazepam VAP protocol (if indicated): yes DVT prophylaxis: SCDs GI prophylaxis: Protonix Glucose control: lantus + ssi  Mobility: Bedrest Code Status: Full Family Communication: Palliative care discussed [place] 11/30. Confirming full code.  Discussed with daughter with RNx2 present 12/3. No changes. Disposition: ICU  CRITICAL CARE Performed by: Bonna Gains Taffany Heiser   Total critical care time: 30 minutes  Critical care time was exclusive of separately billable procedures and treating other patients.  Critical care was necessary to treat or prevent imminent or life-threatening deterioration.  Critical care was time spent personally by me on the following activities: development of treatment plan with patient and/or surrogate as well as nursing, discussions with consultants, evaluation of patient's response to treatment, examination of patient, obtaining history from patient or surrogate, ordering and performing treatments and interventions, ordering and review of laboratory studies, ordering and review of radiographic studies, pulse oximetry and re-evaluation of patient's condition.

## 2020-10-02 NOTE — Progress Notes (Signed)
Spoke w/ pts dtr to provide updates. Pts dtr appreciative.  

## 2020-10-02 NOTE — Plan of Care (Signed)
  Problem: Education: Goal: Utilization of techniques to improve thought processes will improve Outcome: Progressing   Problem: Activity: Goal: Interest or engagement in leisure activities will improve Outcome: Not Progressing Goal: Imbalance in normal sleep/wake cycle will improve Outcome: Not Progressing   Problem: Health Behavior/Discharge Planning: Goal: Ability to make decisions will improve Outcome: Not Progressing Goal: Compliance with therapeutic regimen will improve Outcome: Not Progressing

## 2020-10-02 NOTE — Progress Notes (Signed)
Patient ID: Glennie Isle, female   DOB: 01-Dec-1950, 69 y.o.   MRN: [id number] S: Back on CRRT without interruption. O:BP 114/67   Pulse 84   Temp (!) 97.1 F (36.2 C) (Axillary)   Resp (!) 29   Ht 5' 8"  (1.727 m)   Wt 86.6 kg   SpO2 100%   BMI 29.03 kg/m   Intake/Output Summary (Last 24 hours) at 10/02/2020 1247 Last data filed at 10/02/2020 1200 Gross per 24 hour  Intake 2514.85 ml  Output 3004 ml  Net -489.15 ml   Intake/Output: I/O last 3 completed shifts: In: 3996.5 [I.V.:1149.8; Blood:236.7; NG/GT:2610] Out: 2695 [Other:1965; Stool:730]  Intake/Output this shift:  Total I/O In: 585.5 [I.V.:125.5; NG/GT:460] Out: 1030 [Other:680; Stool:350] Weight change:  Gen: intubated and sedated CVS: RRR Resp: occ rhonchi Abd: +BS, soft, NT/ND Ext: trace edema  Recent Labs  Lab 09/28/20 1807 09/28/20 1807 09/28/20 2216 09/28/20 2232 09/29/20 0518 09/29/20 1550 09/29/20 1600 09/30/20 0413 10/01/20 0350 10/01/20 1850 10/02/20 0520  NA 131*   < > 134*   < > 135 136 136 134* 134* 132* 137  K 4.8   < > 4.1   < > 4.5 4.5 4.5 4.6 5.4* 4.0 5.1  CL 97*   < > 103  --  102  --  103 102 102 102 102  CO2 22   < > 23  --  23  --  23 22 23 22 25   GLUCOSE 311*   < > 157*  --  143*  --  128* 332* 257* 192* 202*  BUN 28*   < > 23  --  23  --  25* 29* 35* 38* 31*  CREATININE 0.55   < > 0.37*  --  0.38*  --  0.46 0.54 0.60 0.58 0.46  ALBUMIN 1.5*  --   --   --  1.4*  --  1.3* 1.2* 1.2* 1.1* 1.1*  CALCIUM 9.4   < > 8.8*  --  9.4  --  9.8 8.8* 9.4 8.0* 8.8*  PHOS 4.9*  --   --   --  3.2  --  3.4 2.6 3.8 3.2 3.4   < > = values in this interval not displayed.   Liver Function Tests: Recent Labs  Lab 10/01/20 0350 10/01/20 1850 10/02/20 0520  ALBUMIN 1.2* 1.1* 1.1*   No results for input(s): LIPASE, AMYLASE in the last 168 hours. No results for input(s): AMMONIA in the last 168 hours. CBC: Recent Labs  Lab 09/27/20 0427 09/28/20 0428 09/28/20 2240 09/28/20 2240 09/29/20 0518  09/29/20 0518 09/29/20 1400 09/29/20 1400 09/29/20 1550 09/30/20 0413 10/01/20 0350  WBC 10.8*   < > 8.7   < > 10.9*   < > 13.2*  --   --  11.5* 9.9  NEUTROABS 9.9*  --   --   --   --   --   --   --   --   --   --   HGB 8.0*   < > 6.8*   < > 8.8*   < > 8.9*   < > 9.2* 7.6* 6.6*  HCT 25.9*   < > 22.2*   < > 27.9*   < > 27.5*   < > 27.0* 24.4* 22.0*  MCV 92.5   < > 91.4  --  92.1  --  91.1  --   --  93.1 96.5  PLT 77*   < > 61*   < > 79*   < >  64*  --   --  57* 57*   < > = values in this interval not displayed.   Cardiac Enzymes: No results for input(s): CKTOTAL, CKMB, CKMBINDEX, TROPONINI in the last 168 hours. CBG: Recent Labs  Lab 10/01/20 1942 10/01/20 2348 10/02/20 0346 10/02/20 0755 10/02/20 1144  GLUCAP 131* 104* 146* 218* 221*    Iron Studies: No results for input(s): IRON, TIBC, TRANSFERRIN, FERRITIN in the last 72 hours. Studies/Results: No results found. . sodium chloride   Intravenous Once  . B-complex with vitamin C  1 tablet Per Tube Daily  . chlorhexidine gluconate (MEDLINE KIT)  15 mL Mouth Rinse BID  . Chlorhexidine Gluconate Cloth  6 each Topical Daily  . clonazepam  1 mg Per Tube BID  . darbepoetin (ARANESP) injection - DIALYSIS  60 mcg Subcutaneous Q Tue-1800  . feeding supplement (PROSource TF)  90 mL Per Tube TID  . insulin aspart  0-20 Units Subcutaneous Q4H  . insulin aspart  8 Units Subcutaneous Q4H  . insulin glargine  25 Units Subcutaneous Daily  . mouth rinse  15 mL Mouth Rinse QID  . midodrine  10 mg Per Tube TID WC  . oxyCODONE  10 mg Per Tube Q6H  . pantoprazole sodium  40 mg Per Tube BID  . QUEtiapine  100 mg Per Tube BID  . sodium chloride flush  10-40 mL Intracatheter Q12H    BMET    Component Value Date/Time   NA 137 10/02/2020 0520   K 5.1 10/02/2020 0520   CL 102 10/02/2020 0520   CO2 25 10/02/2020 0520   GLUCOSE 202 (H) 10/02/2020 0520   BUN 31 (H) 10/02/2020 0520   CREATININE 0.46 10/02/2020 0520   CALCIUM 8.8 (L)  10/02/2020 0520   GFRNONAA >60 10/02/2020 0520   CBC    Component Value Date/Time   WBC 9.9 10/01/2020 0350   RBC 2.28 (L) 10/01/2020 0350   HGB 6.6 (LL) 10/01/2020 0350   HCT 22.0 (L) 10/01/2020 0350   PLT 57 (L) 10/01/2020 0350   MCV 96.5 10/01/2020 0350   MCH 28.9 10/01/2020 0350   MCHC 30.0 10/01/2020 0350   RDW 19.1 (H) 10/01/2020 0350   LYMPHSABS 0.3 (L) 09/27/2020 0427   MONOABS 0.1 09/27/2020 0427   EOSABS 0.0 09/27/2020 0427   BASOSABS 0.0 09/27/2020 0427    Assessment/Plan:  1. AKI- oliguric presumably due to ischemic ATN in setting of hemorrhagic shock. Started on CRRT 09/08/20 due to persistent oliguria and hyperkalemia.  Failed attempt at IHD this morning.  Will resume CRRT today with following orders: 1. All fluids 4K/2.5Ca: Pre-filter 400 ml/hr, post-filter 300 ml/hr, dialysate 1,500 ml/hr 2. UF goal 50 ml/hr as bp tolerates 3. No anticoagulation due to thrombocytopenia and ongoing GI blood loss. 4. Continue with current CRRT settings.  2. Hemorrhagic shock- ongoing blood loss and requiring pressors. 1. Transfuse as needed 3. Septic shock due to MRSA pneumonia. 4. Perforated duodenal ulcer- s/p exploratory lap and Graham patch placement per surgery. 5. Acute hypoxic/hypercapnic respiratory failure due to ARDS from Recent Covid-19 PNA- currently on vent via trach per PCCM.  6. Thrombocytopenia- follow 7. P. Atrial fibrillation- no anticoagulation due to ongoing GI bleed. 8. Vascular access- RIJ trialysis catheter 09/08/20 then removed. Left subclavian trialysis catheter placed 09/15/20 and removed and new right [place] HD catheter placed on 09/29/20. 1. Holding off on [facility] due to ongoing infection.  9. Severe protein malnutrition- per primary svc. 10. Disposition- poor overall prognosis and  will likely require LTC and chronic vent support. PCCM to discuss goals of care with family.  Donetta Potts, MD [employer] [phone]

## 2020-10-03 DIAGNOSIS — J80 Acute respiratory distress syndrome: Secondary | ICD-10-CM | POA: Diagnosis not present

## 2020-10-03 DIAGNOSIS — J9601 Acute respiratory failure with hypoxia: Secondary | ICD-10-CM | POA: Diagnosis not present

## 2020-10-03 DIAGNOSIS — Z9911 Dependence on respirator [ventilator] status: Secondary | ICD-10-CM | POA: Diagnosis not present

## 2020-10-03 DIAGNOSIS — I4891 Unspecified atrial fibrillation: Secondary | ICD-10-CM | POA: Diagnosis not present

## 2020-10-03 LAB — CBC
HCT: 24.1 % — ABNORMAL LOW (ref 36.0–46.0)
HCT: 21.5 % — ABNORMAL LOW (ref 36.0–46.0)
Hemoglobin: 7.2 g/dL — ABNORMAL LOW (ref 12.0–15.0)
Hemoglobin: 6.4 g/dL — CL (ref 12.0–15.0)
MCH: 28.5 pg (ref 26.0–34.0)
MCH: 28.6 pg (ref 26.0–34.0)
MCHC: 29.9 g/dL — ABNORMAL LOW (ref 30.0–36.0)
MCHC: 29.8 g/dL — ABNORMAL LOW (ref 30.0–36.0)
MCV: 95.3 fL (ref 80.0–100.0)
MCV: 96 fL (ref 80.0–100.0)
Platelets: 49 10*3/uL — ABNORMAL LOW (ref 150–400)
Platelets: 68 10*3/uL — ABNORMAL LOW (ref 150–400)
RBC: 2.53 MIL/uL — ABNORMAL LOW (ref 3.87–5.11)
RBC: 2.24 MIL/uL — ABNORMAL LOW (ref 3.87–5.11)
RDW: 18.6 % — ABNORMAL HIGH (ref 11.5–15.5)
RDW: 18.8 % — ABNORMAL HIGH (ref 11.5–15.5)
WBC: 8 10*3/uL (ref 4.0–10.5)
WBC: 6.3 10*3/uL (ref 4.0–10.5)
nRBC: 4.3 % — ABNORMAL HIGH (ref 0.0–0.2)
nRBC: 5.9 % — ABNORMAL HIGH (ref 0.0–0.2)

## 2020-10-03 LAB — CULTURE, BLOOD (ROUTINE X 2)

## 2020-10-03 LAB — RENAL FUNCTION PANEL
Albumin: 1.1 g/dL — ABNORMAL LOW (ref 3.5–5.0)
Albumin: 1.2 g/dL — ABNORMAL LOW (ref 3.5–5.0)
Anion gap: 7 (ref 5–15)
Anion gap: 9 (ref 5–15)
BUN: 28 mg/dL — ABNORMAL HIGH (ref 8–23)
BUN: 24 mg/dL — ABNORMAL HIGH (ref 8–23)
CO2: 24 mmol/L (ref 22–32)
CO2: 21 mmol/L — ABNORMAL LOW (ref 22–32)
Calcium: 8.4 mg/dL — ABNORMAL LOW (ref 8.9–10.3)
Calcium: 7.9 mg/dL — ABNORMAL LOW (ref 8.9–10.3)
Chloride: 105 mmol/L (ref 98–111)
Chloride: 105 mmol/L (ref 98–111)
Creatinine, Ser: 0.36 mg/dL — ABNORMAL LOW (ref 0.44–1.00)
Creatinine, Ser: 0.47 mg/dL (ref 0.44–1.00)
GFR, Estimated: >60 mL/min (ref >60)
GFR, Estimated: >60 mL/min (ref >60)
Glucose, Bld: 86 mg/dL (ref 70–99)
Glucose, Bld: 281 mg/dL — ABNORMAL HIGH (ref 70–99)
Phosphorus: 2.3 mg/dL — ABNORMAL LOW (ref 2.5–4.6)
Phosphorus: 2.7 mg/dL (ref 2.5–4.6)
Potassium: 4.5 mmol/L (ref 3.5–5.1)
Potassium: 4.8 mmol/L (ref 3.5–5.1)
Sodium: 136 mmol/L (ref 135–145)
Sodium: 135 mmol/L (ref 135–145)

## 2020-10-03 LAB — GLUCOSE, CAPILLARY
Glucose-Capillary: 106 mg/dL — ABNORMAL HIGH (ref 70–99)
Glucose-Capillary: 108 mg/dL — ABNORMAL HIGH (ref 70–99)
Glucose-Capillary: 143 mg/dL — ABNORMAL HIGH (ref 70–99)
Glucose-Capillary: 161 mg/dL — ABNORMAL HIGH (ref 70–99)
Glucose-Capillary: 190 mg/dL — ABNORMAL HIGH (ref 70–99)
Glucose-Capillary: 209 mg/dL — ABNORMAL HIGH (ref 70–99)
Glucose-Capillary: 80 mg/dL (ref 70–99)
Glucose-Capillary: 84 mg/dL (ref 70–99)

## 2020-10-03 LAB — MAGNESIUM: Magnesium: 2.1 mg/dL (ref 1.7–2.4)

## 2020-10-03 MED ORDER — OXYCODONE HCL 5 MG PO TABS
5.0000 mg | ORAL_TABLET | Freq: Four times a day (QID) | ORAL | Status: DC
  Administered 2020-10-03 – 2020-10-04 (×3): 5 mg
  Filled 2020-10-03 (×3): qty 1

## 2020-10-03 MED ORDER — DEXTROSE 50 % IV SOLN
INTRAVENOUS | Status: AC
  Administered 2020-10-03: 25 mL
  Filled 2020-10-03: qty 50

## 2020-10-03 MED ORDER — MIDAZOLAM HCL 2 MG/2ML IJ SOLN: 2.0000 mg | Freq: Once | INTRAMUSCULAR | Status: AC

## 2020-10-03 MED ORDER — MIDAZOLAM HCL 2 MG/2ML IJ SOLN
INTRAMUSCULAR | Status: AC
  Filled 2020-10-03: qty 2

## 2020-10-03 MED ORDER — MIDAZOLAM HCL 2 MG/2ML IJ SOLN
2.0000 mg | INTRAMUSCULAR | Status: DC | PRN
  Administered 2020-10-03: 4 mg via INTRAVENOUS
  Administered 2020-10-03: 2 mg via INTRAVENOUS
  Administered 2020-10-03: 4 mg via INTRAVENOUS
  Administered 2020-10-03 – 2020-10-04 (×3): 2 mg via INTRAVENOUS
  Filled 2020-10-03: qty 2
  Filled 2020-10-03: qty 4
  Filled 2020-10-03 (×2): qty 2
  Filled 2020-10-03: qty 4

## 2020-10-03 NOTE — Progress Notes (Signed)
Assisted tele visit to patient with family member.  Candace Vanrossum D Tore Carreker, RN   

## 2020-10-03 NOTE — Progress Notes (Signed)
CRITICAL VALUE ALERT  Critical Value:  HGB 6.4  Date & Time Notied:  10/03/2020 2320  Provider Notified: elink  Orders Received/Actions taken: awaiting orders

## 2020-10-03 NOTE — Progress Notes (Signed)
Assisted tele visit to patient with family member.  Janne Faulk D Kathye Cipriani, RN   

## 2020-10-03 NOTE — Progress Notes (Signed)
eLink Physician-Brief Progress Note Patient Name: Candace [name] DOB: [birth year] MRN: [id number]   Date of Service  10/03/2020  HPI/Events of Note  Pt on CRRT, AM Phos of 2.3 with downward trend of [place]-today 49, no reports of bleeding, AM Hgb 7.2  eICU Interventions  Stable. Continue care.      Intervention Category Intermediate Interventions: Diagnostic test evaluation;Electrolyte abnormality - evaluation and management  Elmer Sow 10/03/2020, 5:12 AM

## 2020-10-03 NOTE — Progress Notes (Signed)
eLink Physician-Brief Progress Note Patient Name: Candace [name] DOB: October 26, 1951 MRN: [id number]   Date of Service  10/03/2020  HPI/Events of Note  Notified of positive blood culture after 4 days of culture, Staph epi from one site. Completed vancomycin for MRSA pneumonia last 12/2.  eICU Interventions  Likely contaminant but discussed with bedside RN to let eLink know if patient develops fever of becomes hemodynamically unstable     Intervention Category Intermediate Interventions: Diagnostic test evaluation  Judd Lien 10/03/2020, 9:42 PM

## 2020-10-03 NOTE — Plan of Care (Signed)

## 2020-10-03 NOTE — Progress Notes (Signed)
Patient ID: Candace [name], female   DOB: 1951/02/06, 69 y.o.   MRN: [id number] S: No events overnight, remains critically ill O:BP (!) 77/55   Pulse (!) 107   Temp (!) 97.5 F (36.4 C) (Oral)   Resp (!) 46   Ht _0  (1.727 m)   Wt 86.7 kg   SpO2 100%   BMI 29.06 kg/m   Intake/Output Summary (Last 24 hours) at 10/03/2020 1125 Last data filed at 10/03/2020 1100 Gross per 24 hour  Intake 2376.98 ml  Output 3448 ml  Net -1071.02 ml   Intake/Output: I/O last 3 completed shifts: In: 3566.2 [I.V.:966.2; NG/GT:2600] Out: 4098 [JXBJY:7829; Stool:800]  Intake/Output this shift:  Total I/O In: 333 [I.V.:93; NG/GT:240] Out: 362 [Other:362] Weight change: 0.1 kg Gen: on vent via trach, opens eyes and can nod or shake head CVS: Tachy at 107 Resp:ventilated BS bilaterally Abd: +BS, soft, NT/ND Ext: 1+ edema of upper and lower extremities  Recent Labs  Lab 09/29/20 1600 09/30/20 0413 10/01/20 0350 10/01/20 1850 10/02/20 0520 10/02/20 1655 10/03/20 0312  NA 136 134* 134* 132* 137 137 136  K 4.5 4.6 5.4* 4.0 5.1 4.5 4.5  CL 103 102 102 102 102 106 105  CO2 _1 GLUCOSE 128* 332* 257* 192* 202* 127* 86  BUN 25* 29* 35* 38* 31* 29* 28*  CREATININE 0.46 0.54 0.60 0.58 0.46 0.41* 0.36*  ALBUMIN 1.3* 1.2* 1.2* 1.1* 1.1* 1.1* 1.1*  CALCIUM 9.8 8.8* 9.4 8.0* 8.8* 8.4* 8.4*  PHOS 3.4 2.6 3.8 3.2 3.4 2.8 2.3*   Liver Function Tests: Recent Labs  Lab 10/02/20 0520 10/02/20 1655 10/03/20 0312  ALBUMIN 1.1* 1.1* 1.1*   No results for input(s): LIPASE, AMYLASE in the last 168 hours. No results for input(s): AMMONIA in the last 168 hours. CBC: Recent Labs  Lab 09/27/20 0427 09/28/20 0428 09/29/20 0518 09/29/20 0518 09/29/20 1400 09/29/20 1550 09/30/20 0413 10/01/20 0350 10/03/20 0312  WBC 10.8*   < > 10.9*   < > 13.2*   < > 11.5* 9.9 8.0  NEUTROABS 9.9*  --   --   --   --   --   --   --   --   HGB 8.0*   < > 8.8*   < > 8.9*   < > 7.6* 6.6* 7.2*  HCT 25.9*    < > 27.9*   < > 27.5*   < > 24.4* 22.0* 24.1*  MCV 92.5   < > 92.1  --  91.1  --  93.1 96.5 95.3  PLT 77*   < > 79*   < > 64*   < > 57* 57* 49*   < > = values in this interval not displayed.   Cardiac Enzymes: No results for input(s): CKTOTAL, CKMB, CKMBINDEX, TROPONINI in the last 168 hours. CBG: Recent Labs  Lab 10/02/20 2324 10/03/20 0310 10/03/20 0311 10/03/20 0346 10/03/20 0747  GLUCAP 137* 80 84 106* 161*    Iron Studies: No results for input(s): IRON, TIBC, TRANSFERRIN, FERRITIN in the last 72 hours. Studies/Results: No results found. . sodium chloride   Intravenous Once  . B-complex with vitamin C  1 tablet Per Tube Daily  . chlorhexidine gluconate (MEDLINE KIT)  15 mL Mouth Rinse BID  . Chlorhexidine Gluconate Cloth  6 each Topical Daily  . clonazepam  1 mg Per Tube BID  . darbepoetin (ARANESP) injection - DIALYSIS  60 mcg Subcutaneous Q Tue-1800  .  feeding supplement (PROSource TF)  90 mL Per Tube TID  . insulin aspart  0-20 Units Subcutaneous Q4H  . insulin aspart  8 Units Subcutaneous Q4H  . insulin glargine  25 Units Subcutaneous Daily  . mouth rinse  15 mL Mouth Rinse QID  . midodrine  10 mg Per Tube TID WC  . oxyCODONE  5 mg Per Tube Q6H  . pantoprazole sodium  40 mg Per Tube BID  . QUEtiapine  100 mg Per Tube BID  . sodium chloride flush  10-40 mL Intracatheter Q12H    BMET    Component Value Date/Time   NA 136 10/03/2020 0312   K 4.5 10/03/2020 0312   CL 105 10/03/2020 0312   CO2 24 10/03/2020 0312   GLUCOSE 86 10/03/2020 0312   BUN 28 (H) 10/03/2020 0312   CREATININE 0.36 (L) 10/03/2020 0312   CALCIUM 8.4 (L) 10/03/2020 0312   GFRNONAA >60 10/03/2020 0312   CBC    Component Value Date/Time   WBC 8.0 10/03/2020 0312   RBC 2.53 (L) 10/03/2020 0312   HGB 7.2 (L) 10/03/2020 0312   HCT 24.1 (L) 10/03/2020 0312   PLT 49 (L) 10/03/2020 0312   MCV 95.3 10/03/2020 0312   MCH 28.5 10/03/2020 0312   MCHC 29.9 (L) 10/03/2020 0312   RDW 18.6 (H)  10/03/2020 0312   LYMPHSABS 0.3 (L) 09/27/2020 0427   MONOABS 0.1 09/27/2020 0427   EOSABS 0.0 09/27/2020 0427   BASOSABS 0.0 09/27/2020 0427    Assessment/Plan:  1. AKI- oliguric presumably due to ischemic ATN in setting of hemorrhagic shock. Started on CRRT 09/08/20 due to persistent oliguria and hyperkalemia.Failed attempt at IHD this morning. Will resume CRRT today with following orders: 1. All fluids 4K/2.5Ca: Pre-filter 400 ml/hr, post-filter 300 ml/hr, dialysate 1,500 ml/hr 2. Will try to increase UF goal 50-100 ml/hr as bp tolerates 3. No anticoagulation due to thrombocytopenia and ongoing GI blood loss. 4. Continue with current CRRT settings.  2. Hemorrhagic shock- ongoing blood loss and requiring pressors. 1. Transfuse as needed 3. Septic shock due to MRSA pneumonia. 4. Perforated duodenal ulcer- s/p exploratory lap and Graham patch placement per surgery. 5. Acute hypoxic/hypercapnic respiratory failure due to ARDS from Recent Covid-19 PNA- currently on vent via trach per PCCM.  6. Thrombocytopenia- follow 7. P. Atrial fibrillation- no anticoagulation due to ongoing GI bleed. 8. Vascular access- RIJ trialysis catheter 09/08/20 then removed. Left subclavian trialysis catheter placed 09/15/20 andremoved and new right [place] HD catheter placed on 09/29/20. 1. Holding off on [facility] due to ongoing infection. 9. Severe protein malnutrition- per primary svc. 10. Disposition- poor overall prognosis and will likely require LTC and chronic vent support. PCCM to discuss goals of care with family.  Donetta Potts, MD [employer] [phone]

## 2020-10-03 NOTE — Progress Notes (Addendum)
NAMEGarnette [name], MRN:  [id number], DOB:  [birth year], LOS: 60 ADMISSION DATE:  08/31/2020, CONSULTATION DATE:  09/07/2020 REFERRING MD:  Dr Candace [name], CHIEF COMPLAINT:  Acute resp failure  Brief History   69 year old female who was previously diagnosed with Covid 08/23/2020.  Admitted 11/2 with AF-RVR, found to have a perforated duodenal underwent exploratory laparotomy with Phillip Heal patch placement.  Past Medical History  Covid pneumonia Atrial fibrillation CKD stage III Diabetes mellitus Hypertension Colon cancer Hyperlipidemia  Significant Hospital Events   11/2 Admitted  11/3 OR -> perforated duodenal ulcer 11/10 progressive hemorrhagic shock, intubated, transfused, pressors, proned; started on CRRT in PM 11/16 Extubated. Re-intubated overnight due to respiratory distress and hypoxia with decreased mentation 11/18  bronch'd/ cultures sent 11/19 hgb down getting blood 09/22/2020 spiked fever resume empirical antimicrobial therapy 11/26: hemorrhagic shock, hgb 5.6, increased pressors, CT A/P   Consults:  Cardiology [facility] Nephrology   Procedures:  11/3 Exploratory laparotomy, Phillip Heal patch, lysis of adhesion for duodenal ulceration postop day 6  R PICC 11/5 >> A line 11/9 >> out ETT 11/9 > 11/16, 11/16 >> 09/21/2020 09/21/2020 tracheostomy>> Lt Angus CVL 11/9 >> R IJ trialysis >> out HD catheter 12/1 >>   Significant Diagnostic Tests:  11/3 CT abd/ pelvis > 1. Positive for bowel perforation: Pneumoperitoneum and intermediate density free fluid in the abdomen. Prior total colectomy. The specific site of perforation is unclear-oral contrast present to the proximal jejunum has not obviously leaked. Note that there may be small bowel loops adherent to the ventral abdominal wall along the greater curve of the stomach. 2. Extensive bilateral lower lung pneumonia. No pleural effusion. 3. Other abdominal and pelvic viscera are stable since 2015, including bilateral adrenal adenomas.  Chronic renal parapelvic cysts. 4. Aortic Atherosclerosis 11/3 TTE > EF 70-75%, RV not well visualized, mildly reduced RV systolic function 54/00 CT chest/ abd/ pelvis> 1. Interval progression of diffuse bilateral hazy ground-glass airspace opacities with more focal areas of consolidation at the lung bases 2. Trace bilateral pleural effusions. 3. Postsurgical changes the abdomen as detailed above. No evidence for a postoperative abscess, however evaluation is limited by lack of IV contrast. 4. There is a 1.9 cm cystic appearing lesion located in the pancreatic body. This was not present on the patient's CT from 2015.  Follow-up with an outpatient contrast enhanced MRI is recommended. 5. The endometrial stripe appears diffusely thickened. Follow-up with pelvic ultrasound is recommended. Aortic Atherosclerosis 11/14 LE doppler studies > + DVT of right posterior tibial and peroneal vein, +dVT of left posterior tibial vein  11/26 CT ABD PEL > liver unremarkable, distended gallbladder with layering tiny gallstones versus sludge, no duct dilatation, mild hyperdensity right upper pole renal collecting system new.  No evidence of retroperitoneal bleeding.  Multifocal lower lobe predominant pulmonary infiltrates/pneumonia.  Small left pleural effusion.  Micro Data:  11/10 MRSA PCR > neg 11/10 BC x 2 > neg 09/22/2020 blood cultures x2>> S epi.  09/22/2020 sputum culture>> MRSA Blood 12/1 >>    Antimicrobials:  azithro 11/2 >11/3 Ceftriaxone 11/2  Fluconazole 11/3 Zosyn 11/3 >> 11/7 Vanc 11/10 off Cefepime 11/10 > 11/16 09/22/2020 vancomycin for MRSA PNA >> 12/2   Subjective:   Not on sedation infusions Continues on NE and vaso  Poor mental status Did briefly attempt PSV/CPAP 10/5. Became tachypnic after 5 minutes so changed back to PRVC  Objective   Blood pressure 126/67, pulse 74, temperature (!) 96.2 F (35.7 C), temperature source Axillary, resp. rate Marland Kitchen)  30, height 5\' 8"  (1.727 m),  weight 86.7 kg, SpO2 100 %. CVP:  [7 mmHg-12 mmHg] 12 mmHg  Vent Mode: PRVC FiO2 (%):  [40 %-60 %] 40 % Set Rate:  [26 bmp] 26 bmp Vt Set:  [510 mL] 510 mL PEEP:  [5 cmH20] 5 cmH20 Plateau Pressure:  [14 cmH20-30 cmH20] 30 cmH20   Intake/Output Summary (Last 24 hours) at 10/03/2020 1042 Last data filed at 10/03/2020 1000 Gross per 24 hour  Intake 2378.83 ml  Output 3609 ml  Net -1230.17 ml   Filed Weights   09/29/20 0500 10/01/20 1210 10/03/20 0337  Weight: 84.5 kg 86.6 kg 86.7 kg   Physical Exam: General: Elderly F, trach vent supine in bed. Critically ill appearing NAD  HENT: Trach secure. Anicteric sclera. Pink mm. Trachea midline. Respiratory: Symmetrical chest expansion. Diminished bibasilar sounds, mechanically ventilated  Cardiovascular: rrr s1s2 no rgm  GI: Ileostomy. Soft round ndnt Extremities: BUE BLE edema. No clubbing Neuro: Grimaces to noxious stimuli. Does not follow commands, PERRL 31mm  Assessment & Plan:   Acute encephalopathy -metabolic in setting of renal failure, shock, sepsis. Likely component of delirium-- hypoactive.  Anxiety - Sedation needs -- without ETT anticipate we can continue to de-escalate  Plan -weaning scheduled oxy (has been 10mg  q6 since 11/16) to 5mg  q6 -PRN fentanyl pushes -Seroquel added on 11/30 -Add clonazepam on 12/2 -avoid infusions -delirium precautions  Acute hypoxic/hypercapnic respiratory failure due to ARDS from COVID-19 pneumonia c/b HCAP and failure to wean and failed extubation 11/16 Tracheostomy status  Plan -Continue to wean FiO2 -continue to try to wean vent. PSV/CPAP poorly tolerated 12/5 due to increased RR  -continue GOC   Multifactorial shock. -previously hemorrhagic shock requiring PRBC, -Septic shock, MRSA pneumonia, treated for 8 days 1/4 bottles BCX with Meth R Staph Epi, likely contaminant Plan -Continue to wean norepinephrine as able.  MAP goal 60. -Stop vaso when NE 10 -Continue midodrine, started  11/30 -Completed full course of vancomycin 12/2 for MRSA pneumonia.  -cont to trend CBC goal hgb> 7  Anuric AKI on CRRT Plan -Appreciate nephrology management of her CVVHD -Would like to transition to intermittent HD but unable to do so due to shock state, pressor needs.  Suspect there will be no improvement.  Duodenal ulcer perforation status post ex lap and Graham's patch Plan -Appreciate CTS assistance and management -Ultimately would need PEG for continued ongoing aggressive care, however we are unable to do   so currently because gastric insufflation would put her duodenal patch at risk.  CCS has discussed this with family   Paroxysmal A. fib, currently in sinus rhythm Plan -Continue telemetry monitoring -Hold off on restarting anticoagulation given GI bleeding this admission  Thrombocytopenia - Plan -trend CBC, no indication for transfusion   Glucose control - no recent hypogoglycemia Plan -Lantus  -Sliding scale insulin   1.9 cm cystic appearing lesion located in the pancreatic body Plan -No intervention at this time, plan outpatient follow-up  History of stage IV colon cancer Status post pulmonary [facility]ct[facility] + Chemo -No intervention at this time  Indeterminate bilateral DVT -no fem/pop DVTs so no IVC filter P -Currently following off anticoagulation due to GIB  Best practice:  Diet: Tube feeds Pain/Anxiety/Delirium protocol (if indicated) fentanyl drip, oxycodone per tube, clonazepam VAP protocol (if indicated): yes DVT prophylaxis: SCDs GI prophylaxis: Protonix Glucose control: lantus + ssi  Mobility: Bedrest Code Status: Full Family Communication: Palliative care discussed [place] 11/30. Confirming full code.  Discussed with  daughter with RNx2 present 12/3. No changes. Pending update 12/5 Disposition: ICU  CRITICAL CARE Performed by: Cristal Generous   Total critical care time: 35 minutes  Critical care time was exclusive of separately  billable procedures and treating other patients. Critical care was necessary to treat or prevent imminent or life-threatening deterioration.  Critical care was time spent personally by me on the following activities: development of treatment plan with patient and/or surrogate as well as nursing, discussions with consultants, evaluation of patient's response to treatment, examination of patient, obtaining history from patient or surrogate, ordering and performing treatments and interventions, ordering and review of laboratory studies, ordering and review of radiographic studies, pulse oximetry and re-evaluation of patient's condition.  Candace Gum MSN, AGACNP-BC [place] [phone] If no answer, [phone] 10/03/2020, 10:42 AM    Pulmonary critical care attending:  This is a 69 year old female previously diagnosed with Covid had a prolonged hospitalization following Covid, perforated duodenal ulcer with a Graham patch, A. fib RVR.  Hemorrhagic shock.  She is required prolonged mechanical support, tracheostomy tube in place.  She has had a difficult time weaning from mechanical support.  Also remained in refractory vasoplegic shock requiring continuous vasopressors.  BP 91/64   Pulse (!) 111   Temp (!) 97.5 F (36.4 C) (Oral)   Resp (!) 29   Ht 5\' 8"  (1.727 m)   Wt 86.7 kg   SpO2 100%   BMI 29.06 kg/m   Gen: Chronically debilitated female tracheostomy tube in place on mechanical vent. HEENT: Trach in place, sedated does not track Heart: Regular rate rhythm, S1-S2 Lungs: Tachypneic, bilateral mechanically ventilated breath sounds Abdomen: Colostomy in place.  Labs: Reviewed Assessment:  Acute metabolic encephalopathy, multifactorial related to renal failure, hypotension, ongoing ICU delirium. Acute hypoxemic hypercapnic respiratory failure requiring tracheostomy tube and prolonged mechanical vent support ARDS from COVID-19 AKI requiring  CVVHD Duodenal perforation requiring Graham patch by surgery Atrial fibrillation  Plan: Still having difficulty weaning from mechanical vent support. She has required on and off vasopressors. She seemingly gets worked up and anxious very quickly and becomes very tachypneic. Continue tube feeding through NG tube. Appreciate surgery input regarding PEG placement.  This does not seem to be an option at this time due to recent surgery. Continue CVVHD per nephrology.  This patient is critically ill with multiple organ system failure; which, requires frequent high complexity decision making, assessment, support, evaluation, and titration of therapies. This was completed through the application of advanced monitoring technologies and extensive interpretation of multiple databases. During this encounter critical care time was devoted to patient care services described in this note for 32 minutes.  Candace Nash, DO [facility] 10/03/2020 3:35 PM

## 2020-10-03 NOTE — Procedures (Signed)
Admit: 08/31/2020 LOS: 32  No events overnight  Current CRRT Prescription: Start Date: 09/08/20 Catheter: Right [place] temp HD catheter placed 09/29/20 BFR: 200-400 ml/hr Pre Blood Pump: 500 ml/hr DFR: 1500 ml/hr Replacement Rate: 332m/hr Goal UF: 50-100 ml/hr Anticoagulation:  None due to ongoing GIB Clotting:  none    S: No complaints, on vent via trach  O: 12/04 0701 - 12/05 0700 In: 2484.5 [I.V.:604.5; NG/GT:1880] Out: 3590 [Stool:800]  Filed Weights   09/29/20 0500 10/01/20 1210 10/03/20 0337  Weight: 84.5 kg 86.6 kg 86.7 kg    Recent Labs  Lab 10/02/20 0520 10/02/20 1655 10/03/20 0312  NA 137 137 136  K 5.1 4.5 4.5  CL 102 106 105  CO2 25 24 24   GLUCOSE 202* 127* 86  BUN 31* 29* 28*  CREATININE 0.46 0.41* 0.36*  CALCIUM 8.8* 8.4* 8.4*  PHOS 3.4 2.8 2.3*   Recent Labs  Lab 09/27/20 0427 09/28/20 0428 09/30/20 0413 10/01/20 0350 10/03/20 0312  WBC 10.8*   < > 11.5* 9.9 8.0  NEUTROABS 9.9*  --   --   --   --   HGB 8.0*   < > 7.6* 6.6* 7.2*  HCT 25.9*   < > 24.4* 22.0* 24.1*  MCV 92.5   < > 93.1 96.5 95.3  PLT 77*   < > 57* 57* 49*   < > = values in this interval not displayed.    Scheduled Meds: . sodium chloride   Intravenous Once  . B-complex with vitamin C  1 tablet Per Tube Daily  . chlorhexidine gluconate (MEDLINE KIT)  15 mL Mouth Rinse BID  . Chlorhexidine Gluconate Cloth  6 each Topical Daily  . clonazepam  1 mg Per Tube BID  . darbepoetin (ARANESP) injection - DIALYSIS  60 mcg Subcutaneous Q Tue-1800  . feeding supplement (PROSource TF)  90 mL Per Tube TID  . insulin aspart  0-20 Units Subcutaneous Q4H  . insulin aspart  8 Units Subcutaneous Q4H  . insulin glargine  25 Units Subcutaneous Daily  . mouth rinse  15 mL Mouth Rinse QID  . midodrine  10 mg Per Tube TID WC  . oxyCODONE  5 mg Per Tube Q6H  . pantoprazole sodium  40 mg Per Tube BID  . QUEtiapine  100 mg Per Tube BID  . sodium chloride flush  10-40 mL Intracatheter Q12H    Continuous Infusions: .  prismasol BGK 4/2.5 500 mL/hr at 10/03/20 0254  .  prismasol BGK 4/2.5 300 mL/hr at 10/01/20 1241  . sodium chloride Stopped (09/28/20 1803)  . sodium chloride    . feeding supplement (VITAL 1.5 CAL) 1,000 mL (10/03/20 0600)  . norepinephrine (LEVOPHED) Adult infusion 12 mcg/min (10/03/20 1100)  . prismasol BGK 4/2.5 1,500 mL/hr at 10/03/20 0603  . vasopressin 0.04 Units/min (10/03/20 1100)   PRN Meds:.sodium chloride, [CANCELED] Place/Maintain arterial line **AND** sodium chloride, acetaminophen (TYLENOL) oral liquid 160 mg/5 mL, albuterol, bacitracin, diphenhydrAMINE, fentaNYL (SUBLIMAZE) injection, heparin, heparin, midazolam, phenol, sodium chloride, sodium chloride flush  ABG    Component Value Date/Time   PHART 7.357 09/29/2020 1550   PCO2ART 43.5 09/29/2020 1550   PO2ART 67 (L) 09/29/2020 1550   HCO3 24.0 10/02/2020 1815   TCO2 26 09/29/2020 1550   ACIDBASEDEF 0.9 10/02/2020 1815   O2SAT 67.5 10/02/2020 1815    A/P  1. Dialysis dependent  AKI in setting of covid infection complicated by A fib with RVR and hypotension as well as hemorrhagic shock without  evidence of renal recovery.     Donato Heinz, MD [place] Pager: [phone] Office [phone]

## 2020-10-04 ENCOUNTER — Inpatient Hospital Stay (HOSPITAL_COMMUNITY): Payer: Medicare Other

## 2020-10-04 DIAGNOSIS — J9601 Acute respiratory failure with hypoxia: Secondary | ICD-10-CM

## 2020-10-04 LAB — RENAL FUNCTION PANEL
Albumin: 1.3 g/dL — ABNORMAL LOW (ref 3.5–5.0)
Albumin: 1.2 g/dL — ABNORMAL LOW (ref 3.5–5.0)
Anion gap: 7 (ref 5–15)
Anion gap: 8 (ref 5–15)
BUN: 25 mg/dL — ABNORMAL HIGH (ref 8–23)
BUN: 28 mg/dL — ABNORMAL HIGH (ref 8–23)
CO2: 25 mmol/L (ref 22–32)
CO2: 23 mmol/L (ref 22–32)
Calcium: 8.5 mg/dL — ABNORMAL LOW (ref 8.9–10.3)
Calcium: 7.6 mg/dL — ABNORMAL LOW (ref 8.9–10.3)
Chloride: 103 mmol/L (ref 98–111)
Chloride: 104 mmol/L (ref 98–111)
Creatinine, Ser: 0.4 mg/dL — ABNORMAL LOW (ref 0.44–1.00)
Creatinine, Ser: 0.47 mg/dL (ref 0.44–1.00)
GFR, Estimated: >60 mL/min (ref >60)
GFR, Estimated: >60 mL/min (ref >60)
Glucose, Bld: 120 mg/dL — ABNORMAL HIGH (ref 70–99)
Glucose, Bld: 163 mg/dL — ABNORMAL HIGH (ref 70–99)
Phosphorus: 2.4 mg/dL — ABNORMAL LOW (ref 2.5–4.6)
Phosphorus: 3 mg/dL (ref 2.5–4.6)
Potassium: 4.7 mmol/L (ref 3.5–5.1)
Potassium: 4.9 mmol/L (ref 3.5–5.1)
Sodium: 135 mmol/L (ref 135–145)
Sodium: 135 mmol/L (ref 135–145)

## 2020-10-04 LAB — CULTURE, BLOOD (ROUTINE X 2): Culture: NO GROWTH

## 2020-10-04 LAB — CBC
HCT: 27.7 % — ABNORMAL LOW (ref 36.0–46.0)
Hemoglobin: 8.8 g/dL — ABNORMAL LOW (ref 12.0–15.0)
MCH: 29.7 pg (ref 26.0–34.0)
MCHC: 31.8 g/dL (ref 30.0–36.0)
MCV: 93.6 fL (ref 80.0–100.0)
Platelets: 75 10*3/uL — ABNORMAL LOW (ref 150–400)
RBC: 2.96 MIL/uL — ABNORMAL LOW (ref 3.87–5.11)
RDW: 17.2 % — ABNORMAL HIGH (ref 11.5–15.5)
WBC: 6.3 10*3/uL (ref 4.0–10.5)
nRBC: 6.1 % — ABNORMAL HIGH (ref 0.0–0.2)

## 2020-10-04 LAB — DIFFERENTIAL
Abs Immature Granulocytes: 0.44 10*3/uL — ABNORMAL HIGH (ref 0.00–0.07)
Basophils Absolute: 0 10*3/uL (ref 0.0–0.1)
Basophils Relative: 1 %
Eosinophils Absolute: 0 10*3/uL (ref 0.0–0.5)
Eosinophils Relative: 0 %
Immature Granulocytes: 7 %
Lymphocytes Relative: 6 %
Lymphs Abs: 0.4 10*3/uL — ABNORMAL LOW (ref 0.7–4.0)
Monocytes Absolute: 0.2 10*3/uL (ref 0.1–1.0)
Monocytes Relative: 4 %
Neutro Abs: 5.2 10*3/uL (ref 1.7–7.7)
Neutrophils Relative %: 82 %
WBC Morphology: INCREASED

## 2020-10-04 LAB — PREALBUMIN: Prealbumin: 12.1 mg/dL — ABNORMAL LOW (ref 18–38)

## 2020-10-04 LAB — GLUCOSE, CAPILLARY
Glucose-Capillary: 138 mg/dL — ABNORMAL HIGH (ref 70–99)
Glucose-Capillary: 115 mg/dL — ABNORMAL HIGH (ref 70–99)
Glucose-Capillary: 182 mg/dL — ABNORMAL HIGH (ref 70–99)
Glucose-Capillary: 95 mg/dL (ref 70–99)
Glucose-Capillary: 117 mg/dL — ABNORMAL HIGH (ref 70–99)
Glucose-Capillary: 126 mg/dL — ABNORMAL HIGH (ref 70–99)

## 2020-10-04 LAB — MAGNESIUM: Magnesium: 2.2 mg/dL (ref 1.7–2.4)

## 2020-10-04 LAB — PREPARE RBC (CROSSMATCH)

## 2020-10-04 LAB — TRIGLYCERIDES: Triglycerides: 175 mg/dL — ABNORMAL HIGH (ref <150)

## 2020-10-04 MED ORDER — FENTANYL CITRATE (PF) 100 MCG/2ML IJ SOLN
100.0000 ug | Freq: Once | INTRAMUSCULAR | Status: AC
  Administered 2020-10-04: 100 ug via INTRAVENOUS

## 2020-10-04 MED ORDER — DEXMEDETOMIDINE HCL IN NACL 400 MCG/100ML IV SOLN
0.4000 ug/kg/h | INTRAVENOUS | Status: DC
  Administered 2020-10-04: 0.4 ug/kg/h via INTRAVENOUS
  Administered 2020-10-04 – 2020-10-05 (×2): 1.2 ug/kg/h via INTRAVENOUS
  Administered 2020-10-05: 0.2 ug/kg/h via INTRAVENOUS
  Administered 2020-10-05 – 2020-10-07 (×14): 1.2 ug/kg/h via INTRAVENOUS
  Administered 2020-10-08: 0.4 ug/kg/h via INTRAVENOUS
  Administered 2020-10-08: 0.6 ug/kg/h via INTRAVENOUS
  Administered 2020-10-08: 1.2 ug/kg/h via INTRAVENOUS
  Administered 2020-10-09 (×2): 0.8 ug/kg/h via INTRAVENOUS
  Administered 2020-10-09: 14:00:00 1.2 ug/kg/h via INTRAVENOUS
  Administered 2020-10-09: 04:00:00 0.8 ug/kg/h via INTRAVENOUS
  Administered 2020-10-09: 18:00:00 1.2 ug/kg/h via INTRAVENOUS
  Administered 2020-10-10 (×2): 0.9 ug/kg/h via INTRAVENOUS
  Administered 2020-10-10: 05:00:00 0.8 ug/kg/h via INTRAVENOUS
  Administered 2020-10-10 – 2020-10-11 (×2): 0.9 ug/kg/h via INTRAVENOUS
  Administered 2020-10-11: 14:00:00 0.4 ug/kg/h via INTRAVENOUS
  Administered 2020-10-11: 06:00:00 0.8 ug/kg/h via INTRAVENOUS
  Administered 2020-10-12 (×3): 0.4 ug/kg/h via INTRAVENOUS
  Administered 2020-10-12: 21:00:00 0.8 ug/kg/h via INTRAVENOUS
  Administered 2020-10-13: 23:00:00 1.1 ug/kg/h via INTRAVENOUS
  Administered 2020-10-13: 19:00:00 1 ug/kg/h via INTRAVENOUS
  Administered 2020-10-13: 11:00:00 0.6 ug/kg/h via INTRAVENOUS
  Administered 2020-10-13: 04:00:00 0.7 ug/kg/h via INTRAVENOUS
  Administered 2020-10-14: 11:00:00 0.8 ug/kg/h via INTRAVENOUS
  Administered 2020-10-14: 18:00:00 0.6 ug/kg/h via INTRAVENOUS
  Administered 2020-10-14: 05:00:00 0.9 ug/kg/h via INTRAVENOUS
  Administered 2020-10-14: 02:00:00 1.1 ug/kg/h via INTRAVENOUS
  Administered 2020-10-15: 06:00:00 0.2 ug/kg/h via INTRAVENOUS
  Administered 2020-10-15: 1.2 ug/kg/h via INTRAVENOUS
  Administered 2020-10-15: 18:00:00 0.4 ug/kg/h via INTRAVENOUS
  Filled 2020-10-04 (×13): qty 100
  Filled 2020-10-04: qty 200
  Filled 2020-10-04 (×36): qty 100

## 2020-10-04 MED ORDER — MIDODRINE HCL 5 MG PO TABS
20.0000 mg | ORAL_TABLET | Freq: Three times a day (TID) | ORAL | Status: DC
  Administered 2020-10-04 – 2020-10-24 (×59): 20 mg
  Filled 2020-10-04 (×59): qty 4

## 2020-10-04 MED ORDER — OXYCODONE HCL 5 MG PO TABS
5.0000 mg | ORAL_TABLET | Freq: Three times a day (TID) | ORAL | Status: DC
  Administered 2020-10-04 – 2020-10-16 (×35): 5 mg
  Filled 2020-10-04 (×35): qty 1

## 2020-10-04 MED ORDER — FENTANYL CITRATE (PF) 100 MCG/2ML IJ SOLN
INTRAMUSCULAR | Status: AC
  Filled 2020-10-04: qty 2

## 2020-10-04 MED ORDER — SODIUM CHLORIDE 0.9% IV SOLUTION: Freq: Once | INTRAVENOUS | Status: DC

## 2020-10-04 MED ORDER — COLLAGENASE 250 UNIT/GM EX OINT
TOPICAL_OINTMENT | Freq: Every day | CUTANEOUS | Status: DC
  Administered 2020-10-09 – 2020-10-22 (×3): 1 via TOPICAL
  Filled 2020-10-04 (×3): qty 30

## 2020-10-04 MED ORDER — FENTANYL CITRATE (PF) 100 MCG/2ML IJ SOLN
25.0000 ug | Freq: Once | INTRAMUSCULAR | Status: AC
  Administered 2020-10-04: 25 ug via INTRAVENOUS

## 2020-10-04 NOTE — Progress Notes (Signed)
eLink Physician-Brief Progress Note Patient Name: Candace [name] DOB: 08/03/1951 MRN: [id number]   Date of Service  10/04/2020  HPI/Events of Note  Notified of H/H 6.4/21.5 from 7.2/24.1. No report of active bleed. Still on norepinephrine  eICU Interventions  Transfuse 1 unit PRBC     Intervention Category Major Interventions: Other:  Judd Lien 10/04/2020, 12:10 AM

## 2020-10-04 NOTE — Progress Notes (Signed)
33 Days Post-Op  Subjective: On vent on trach.  On RRT as she failed IHD.  hgb 6.4 this am but no active bleeding.  Tolerating TFs with no issues  ROS: See above, otherwise other systems negative  Objective: Vital signs in last 24 hours: Temp:  [97.5 F (36.4 C)-100.8 F (38.2 C)] 99.2 F (37.3 C) (12/06 0730) Pulse Rate:  [74-133] 107 (12/06 0730) Resp:  [21-54] 39 (12/06 0730) BP: (74-137)/(45-84) 102/68 (12/06 0730) SpO2:  [93 %-100 %] 97 % (12/06 0730) FiO2 (%):  [40 %-60 %] 40 % (12/06 0730) Weight:  [86.7 kg] 86.7 kg (12/06 0412) Last BM Date: 10/03/20  Intake/Output from previous day: 12/05 0701 - 12/06 0700 In: 2371.5 [I.V.:604.4; Blood:327.1; NG/GT:1440] Out: 6195 [Stool:350] Intake/Output this shift: No intake/output data recorded.  PE: Abd: soft, midline wound is clean and packed, +BS, ostomy with good feculent output.  Lab Results:  Recent Labs    10/03/20 2223 10/04/20 0516  WBC 6.3 6.3  HGB 6.4* 8.8*  HCT 21.5* 27.7*  PLT 68* 75*   BMET Recent Labs    10/03/20 1730 10/04/20 0516  NA 135 135  K 4.8 4.7  CL 105 103  CO2 21* 25  GLUCOSE 281* 120*  BUN 24* 25*  CREATININE 0.47 0.40*  CALCIUM 7.9* 8.5*   PT/INR No results for input(s): LABPROT, INR in the last 72 hours. CMP     Component Value Date/Time   NA 135 10/04/2020 0516   K 4.7 10/04/2020 0516   CL 103 10/04/2020 0516   CO2 25 10/04/2020 0516   GLUCOSE 120 (H) 10/04/2020 0516   BUN 25 (H) 10/04/2020 0516   CREATININE 0.40 (L) 10/04/2020 0516   CALCIUM 8.5 (L) 10/04/2020 0516   PROT 5.1 (L) 09/15/2020 0443   ALBUMIN 1.3 (L) 10/04/2020 0516   AST 127 (H) 09/15/2020 0443   ALT 515 (H) 09/15/2020 0443   ALKPHOS 287 (H) 09/15/2020 0443   BILITOT 1.1 09/15/2020 0443   GFRNONAA >60 10/04/2020 0516   Lipase     Component Value Date/Time   LIPASE 48 08/31/2020 2054       Studies/Results: No results found.  Anti-infectives: Anti-infectives (From admission, onward)    Start     Dose/Rate Route Frequency Ordered Stop   09/24/20 1000  ceFAZolin (ANCEF) IVPB 2g/100 mL premix  Status:  Discontinued        2 g 200 mL/hr over 30 Minutes Intravenous Every 12 hours 09/24/20 0801 09/24/20 1046   09/23/20 1400  vancomycin (VANCOCIN) IVPB 1000 mg/200 mL premix        1,000 mg 200 mL/hr over 60 Minutes Intravenous Every 24 hours 09/22/20 1436 09/28/20 1718   09/22/20 2200  ceFEPIme (MAXIPIME) 2 g in sodium chloride 0.9 % 100 mL IVPB  Status:  Discontinued        2 g 200 mL/hr over 30 Minutes Intravenous Every 12 hours 09/22/20 1436 09/24/20 0801   09/22/20 1030  ceFEPIme (MAXIPIME) 1 g in sodium chloride 0.9 % 100 mL IVPB        1 g 200 mL/hr over 30 Minutes Intravenous  Once 09/22/20 0934 09/22/20 1145   09/22/20 1015  vancomycin (VANCOCIN) IVPB 1000 mg/200 mL premix        1,000 mg 200 mL/hr over 60 Minutes Intravenous  Once 09/22/20 0934 09/22/20 1446   09/12/20 2200  ceFEPIme (MAXIPIME) 2 g in sodium chloride 0.9 % 100 mL IVPB  2 g 200 mL/hr over 30 Minutes Intravenous Every 12 hours 09/12/20 0732 09/14/20 2134   09/11/20 1400  ceFEPIme (MAXIPIME) 2 g in sodium chloride 0.9 % 100 mL IVPB  Status:  Discontinued        2 g 200 mL/hr over 30 Minutes Intravenous Every 8 hours 09/11/20 1126 09/12/20 0732   09/08/20 1000  vancomycin (VANCOREADY) IVPB 2000 mg/400 mL        2,000 mg 200 mL/hr over 120 Minutes Intravenous  Once 09/08/20 0857 09/08/20 1224   09/08/20 1000  ceFEPIme (MAXIPIME) 2 g in sodium chloride 0.9 % 100 mL IVPB  Status:  Discontinued        2 g 200 mL/hr over 30 Minutes Intravenous Every 12 hours 09/08/20 0857 09/11/20 1126   09/08/20 0856  vancomycin variable dose per unstable renal function (pharmacist dosing)  Status:  Discontinued         Does not apply See admin instructions 09/08/20 0857 09/09/20 0935   09/02/20 1600  cefTRIAXone (ROCEPHIN) 1 g in sodium chloride 0.9 % 100 mL IVPB  Status:  Discontinued        1 g 200 mL/hr over 30  Minutes Intravenous Every 24 hours 09/01/20 1811 09/02/20 0838   09/01/20 1800  fluconazole (DIFLUCAN) IVPB 400 mg        400 mg 50 mL/hr over 240 Minutes Intravenous  Once 09/01/20 1749 09/02/20 0603   09/01/20 1530  piperacillin-tazobactam (ZOSYN) IVPB 3.375 g        3.375 g 12.5 mL/hr over 240 Minutes Intravenous Every 8 hours 09/01/20 1514 09/05/20 2111   09/01/20 1000  levofloxacin (LEVAQUIN) tablet 250 mg  Status:  Discontinued        250 mg Oral Daily 08/31/20 1508 08/31/20 1735   08/31/20 1730  cefTRIAXone (ROCEPHIN) 1 g in sodium chloride 0.9 % 100 mL IVPB  Status:  Discontinued        1 g 200 mL/hr over 30 Minutes Intravenous Every 24 hours 08/31/20 1726 09/01/20 1513   08/31/20 1730  azithromycin (ZITHROMAX) 500 mg in sodium chloride 0.9 % 250 mL IVPB  Status:  Discontinued        500 mg 250 mL/hr over 60 Minutes Intravenous Every 24 hours 08/31/20 1726 09/02/20 0838       Assessment/Plan HTN HLD DM Hx colon cancer s/p partial colectomy/colostomy followed by completion colectomy and ileostomy Covid-19 pneumonia  Elevated D dimer on eliquis A fib RVR Anemia AKI on CKD-IIIb Malnutrition - prealbumin12 from 7 Acute respiratory failure- vent per CCM, trach last week, still on vent Recent Covid pneumonia Shock- still on levo, vaso trying to be weaned Acute blood loss anemia- s/p multiple transfusions.  No longer bleeding, anemia likely secondary to chronic disease Acute renal failure - on CRRT, unable to tolerate intermittent HD  Pneumoperitoneum,perforated duodenal ulcer S/pexploratory laparotomy, adhesiolysis, modified graham patch repair of perforated duodenal ulcer11/3 Dr. Bobbye Morton -POD#33 - continue PPI BID - UGI negative for leak11/8 -tolerating TF - BID wet to dry dressing changes to midline abdominal wound -unable to proceed with PEG tube placement due to risk of reperforation with insufflation.   -no acute surgical interventions warranted  currently.  Will check wound weekly  ID -zosyn 11/3>>11/8.vancomycin 11/10.Maxipime11/10>11/16 VTE -heparin gtt FEN -OG, TF @ 65cc/hr Foley -replaced 11/9 Follow up -Dr. Chrystie Nose - daughter [name] [phone]   LOS: 25 days    Henreitta Cea , [facility] 10/04/2020, 8:17 AM Please see  Amion for pager number during day hours 7:00am-4:30pm or 7:00am -11:30am on weekends

## 2020-10-04 NOTE — Consult Note (Addendum)
[place] Nurse Follow Up Consult Note: Patient receiving care in [facility] [phone] Reason for Consult: Buttock wound Wound type: Unstageable to the sacrum Pressure Injury POA: No Wound bed:  100% black eschar Drainage (amount, consistency, odor) None Dressing procedure/placement/frequency: Apply Santyl to sacral area in a nickel thick layer. Cover with a saline moistened gauze, then dry gauze or ABD pad.  Change daily.  WOC will follow weekly, will consider Hydrotherapy next week or sooner if needed. Please re-consult if needed.   Cathlean Marseilles Tamala Julian, MSN, RN, [place], Lysle Pearl, [facility] Wound Treatment Associate Pager [phone]

## 2020-10-04 NOTE — Progress Notes (Addendum)
NAMEYanel [name], MRN:  [id number], DOB:  [birth year], LOS: 35 ADMISSION DATE:  08/31/2020, CONSULTATION DATE:  09/07/2020 REFERRING MD:  Dr Candiss Norse, CHIEF COMPLAINT:  Acute resp failure  Brief History   69 year old female who was previously diagnosed with Covid 08/23/2020.  Admitted 11/2 with AF-RVR, found to have a perforated duodenal underwent exploratory laparotomy with Phillip Heal patch placement.  Past Medical History  Covid pneumonia Atrial fibrillation CKD stage III Diabetes mellitus Hypertension Colon cancer Hyperlipidemia  Significant Hospital Events   11/2 Admitted  11/3 OR -> perforated duodenal ulcer 11/10 progressive hemorrhagic shock, intubated, transfused, pressors, proned; started on CRRT in PM 11/16 Extubated. Re-intubated overnight due to respiratory distress and hypoxia with decreased mentation 11/18  bronch'd/ cultures sent 11/19 hgb down getting blood 09/22/2020 spiked fever resume empirical antimicrobial therapy 11/26: hemorrhagic shock, hgb 5.6, increased pressors, CT A/P   Consults:  Cardiology [facility] Nephrology   Procedures:  11/3 Exploratory laparotomy, Phillip Heal patch, lysis of adhesion for duodenal ulceration postop day 6  R PICC 11/5 >> A line 11/9 >> out ETT 11/9 > 11/16, 11/16 >> 09/21/2020 09/21/2020 tracheostomy>> Lt [place] CVL 11/9 >> R IJ trialysis >> out HD catheter 12/1 >>   Significant Diagnostic Tests:  11/3 CT abd/ pelvis > 1. Positive for bowel perforation: Pneumoperitoneum and intermediate density free fluid in the abdomen. Prior total colectomy. The specific site of perforation is unclear-oral contrast present to the proximal jejunum has not obviously leaked. Note that there may be small bowel loops adherent to the ventral abdominal wall along the greater curve of the stomach. 2. Extensive bilateral lower lung pneumonia. No pleural effusion. 3. Other abdominal and pelvic viscera are stable since 2015, including bilateral adrenal adenomas.  Chronic renal parapelvic cysts. 4. Aortic Atherosclerosis 11/3 TTE > EF 70-75%, RV not well visualized, mildly reduced RV systolic function 96/28 CT chest/ abd/ pelvis> 1. Interval progression of diffuse bilateral hazy ground-glass airspace opacities with more focal areas of consolidation at the lung bases 2. Trace bilateral pleural effusions. 3. Postsurgical changes the abdomen as detailed above. No evidence for a postoperative abscess, however evaluation is limited by lack of IV contrast. 4. There is a 1.9 cm cystic appearing lesion located in the pancreatic body. This was not present on the patient's CT from 2015.  Follow-up with an outpatient contrast enhanced MRI is recommended. 5. The endometrial stripe appears diffusely thickened. Follow-up with pelvic ultrasound is recommended. Aortic Atherosclerosis 11/14 LE doppler studies > + DVT of right posterior tibial and peroneal vein, +dVT of left posterior tibial vein  11/26 CT ABD PEL > liver unremarkable, distended gallbladder with layering tiny gallstones versus sludge, no duct dilatation, mild hyperdensity right upper pole renal collecting system new.  No evidence of retroperitoneal bleeding.  Multifocal lower lobe predominant pulmonary infiltrates/pneumonia.  Small left pleural effusion.  Micro Data:  11/10 MRSA PCR > neg 11/10 BC x 2 > neg 09/22/2020 blood cultures x2>> S epi.  09/22/2020 sputum culture>> MRSA Blood 12/1 >>    Antimicrobials:  azithro 11/2 >11/3 Ceftriaxone 11/2  Fluconazole 11/3 Zosyn 11/3 >> 11/7 Vanc 11/10 off Cefepime 11/10 > 11/16 09/22/2020 vancomycin for MRSA PNA >> 12/2   Subjective:   Patient was unable to tolerate pressure support trial, became tachypneic with respiratory rate in 50s Continue to remain drowsy despite off sedative medication infusions  Objective   Blood pressure (!) 104/59, pulse (!) 103, temperature 99.2 F (37.3 C), temperature source Axillary, resp. rate (!) 32, height 5'  8"  (1.727 m), weight 86.7 kg, SpO2 99 %. CVP:  [10 mmHg-12 mmHg] 12 mmHg  Vent Mode: PRVC FiO2 (%):  [40 %-60 %] 40 % Set Rate:  [26 bmp] 26 bmp Vt Set:  [510 mL] 510 mL PEEP:  [5 cmH20] 5 cmH20 Plateau Pressure:  [24 cmH20-29 cmH20] 26 cmH20   Intake/Output Summary (Last 24 hours) at 10/04/2020 0951 Last data filed at 10/04/2020 0800 Gross per 24 hour  Intake 2287.29 ml  Output 3483 ml  Net -1195.71 ml   Filed Weights   10/01/20 1210 10/03/20 0337 10/04/20 0412  Weight: 86.6 kg 86.7 kg 86.7 kg   Physical Exam: General: Critically ill elderly F, s/p trach vent supine in bed HENT: Trach secure. Anicteric sclera. Pink mm. Trachea midline. Respiratory: Symmetrical chest expansion. Diminished bibasilar sounds, mechanically ventilated  Cardiovascular: rrr s1s2 no rgm  GI: Ileostomy. Soft round ndnt Extremities: Generalized anasarca with 3+ pitting edema, No clubbing Neuro: Grimaces to noxious stimuli. Does not follow commands, PERRL 103mm  Assessment & Plan:   Acute metabolic encephalopath in the setting of renal failure, shock, sepsis Decrease scheduled oxy 5 mg every 8 hour Discontinue PRN fentanyl pushes Continue Seroquel and Klonopin Delirium precautions  Acute hypoxic/hypercapnic respiratory failure due to ARDS from COVID-19 pneumonia c/b HCAP and failure to wean and failed extubation 11/16, s/p tracheostomy Patient is unable to tolerate pressure support trials due to tachypnea Currently on minimal vent setting with FiO2 30% and PEEP of 5  Multifactorial shock -previously hemorrhagic shock requiring PRBC, -Septic shock, MRSA pneumonia, treated for 8 days 1/4 bottles BCX with Meth R Staph Epi, likely contaminant  Continue to wean norepinephrine as able.  MAP goal 60. Discontinue vasopressin Increase midodrine to 20 mg 3 times daily Completed full course of vancomycin 12/2 for MRSA pneumonia.  Continue contact isolation Monitor PRBC with goal hemoglobin is between 7 and  8  Anuric AKI on CRRT Appreciate nephrology management of her CVVHD Would like to transition to intermittent HD but unable to do so due to shock state, pressor needs  Duodenal ulcer perforation status post ex lap and Graham's patch Appreciate CTS assistance and management Ultimately would need PEG for continued ongoing aggressive care, however we are unable to do  so currently because gastric insufflation would put her duodenal patch at risk.  CCS has discussed this with family   Paroxysmal A. fib, currently in sinus rhythm We will restart heparin tomorrow, for stroke prophylaxis if her hemoglobin and platelet count remained stable  Thrombocytopenia - Patient's platelet counts are stable now  Diabetes type 2 Blood sugars are better controlled now Continue Lantus and sliding scale insulin   1.9 cm cystic appearing lesion located in the pancreatic body Plan -No intervention at this time, plan outpatient follow-up  History of stage IV colon cancer Status post pulmonary metastatectomies at [facility] + Chemo No intervention at this time  Indeterminate bilateral DVT We may need to restart heparin infusion if her hemoglobin remained stable until tomorrow  Best practice:  Diet: Tube feeds Pain/Anxiety/Delirium protocol (if indicated) fentanyl drip, oxycodone per tube, clonazepam VAP protocol (if indicated): yes DVT prophylaxis: SCDs GI prophylaxis: Protonix Glucose control: lantus + ssi  Mobility: Bedrest Code Status: Full Family Communication: Palliative care discussed [place] 11/30. Confirming full code.  Discussed with daughter with RNx2 present 12/3. No changes. Pending update 12/5 Disposition: ICU   Total critical care time: 37 minutes  Performed by: [place] care time was exclusive of  separately billable procedures and treating other patients.   Critical care was necessary to treat or prevent imminent or life-threatening deterioration.   Critical care was time  spent personally by me on the following activities: development of treatment plan with patient and/or surrogate as well as nursing, discussions with consultants, evaluation of patient's response to treatment, examination of patient, obtaining history from patient or surrogate, ordering and performing treatments and interventions, ordering and review of laboratory studies, ordering and review of radiographic studies, pulse oximetry and re-evaluation of patient's condition.   Jacky Kindle MD [place] [facility] Pager: [phone] Mobile: [phone]

## 2020-10-04 NOTE — Progress Notes (Signed)
[facility] Progress Note      Assessment/ Plan:   1. AKI- oliguric presumably due to ischemic ATN in setting of hemorrhagic shock. Started on CRRT 09/08/20 due to persistent oliguria and hyperkalemia.Failed attempt at IHD Sun AM  1. All fluids 4K/2.5Ca: Pre-filter 400 ml/hr, post-filter 300 ml/hr, dialysate 1,500 ml/hr 2. Will try to increase UF goal 50-100 ml/hr as bp tolerates 3. No anticoagulation due to thrombocytopenia and ongoing GI blood loss but with frequent clotting (at least once per shift) will trial heparin o/w will need citrate protocol.. 4. Continue with current CRRT settings.  2. Hemorrhagic shock- ongoing blood loss and requiring pressors. 1. Transfuse as needed 3. Septic shock due to MRSA pneumonia. 4. Perforated duodenal ulcer- s/p exploratory lap and Graham patch placement per surgery. 5. Acute hypoxic/hypercapnic respiratory failure due to ARDS from Recent Covid-19 PNA- currently on vent via trach per PCCM.  6. Thrombocytopenia- follow 7. P. Atrial fibrillation- no anticoagulation due to ongoing GI bleed. 8. Vascular access- RIJ trialysis catheter 09/08/20 then removed. Left subclavian trialysis catheter placed 09/15/20 andremoved and new right [place] HD catheter placed on 09/29/20. 1. Holding off on [facility] due to ongoing infection. 9. Severe protein malnutrition- per primary svc. 10. Disposition- poor overall prognosis and will likely require LTC and chronic vent support. PCCM to discuss goals of care with family.  Subjective:   Filter clotting frequently. No M150 or Oxiris available. Remains on Levo and pressures are very soft.   Objective:   BP 95/71   Pulse (!) 113   Temp 99.2 F (37.3 C) (Axillary)   Resp (!) 29   Ht 5' 8"  (1.727 m)   Wt 86.7 kg   SpO2 97%   BMI 29.06 kg/m   Intake/Output Summary (Last 24 hours) at 10/04/2020 1043 Last data filed at 10/04/2020 1000 Gross per 24 hour  Intake 2352.1 ml  Output 3600 ml  Net -1247.9  ml   Weight change: 0 kg  Physical Exam: Gen: on vent via trach, opens eyes and can nod or shake head CVS: Tachy at 107 Resp:ventilated BS bilaterally Abd: +BS, soft, NT/ND Ext: 1+ edema of upper and lower extremities Access: Rt scv temp  Imaging: No results found.  Labs: BMET Recent Labs  Lab 10/01/20 0350 10/01/20 1850 10/02/20 0520 10/02/20 1655 10/03/20 0312 10/03/20 1730 10/04/20 0516  NA 134* 132* 137 137 136 135 135  K 5.4* 4.0 5.1 4.5 4.5 4.8 4.7  CL 102 102 102 106 105 105 103  CO2 23 22 25 24 24  21* 25  GLUCOSE 257* 192* 202* 127* 86 281* 120*  BUN 35* 38* 31* 29* 28* 24* 25*  CREATININE 0.60 0.58 0.46 0.41* 0.36* 0.47 0.40*  CALCIUM 9.4 8.0* 8.8* 8.4* 8.4* 7.9* 8.5*  PHOS 3.8 3.2 3.4 2.8 2.3* 2.7 2.4*   CBC Recent Labs  Lab 10/01/20 0350 10/03/20 0312 10/03/20 2223 10/04/20 0516  WBC 9.9 8.0 6.3 6.3  NEUTROABS  --   --   --  5.2  HGB 6.6* 7.2* 6.4* 8.8*  HCT 22.0* 24.1* 21.5* 27.7*  MCV 96.5 95.3 96.0 93.6  PLT 57* 49* 68* 75*    Medications:    . sodium chloride   Intravenous Once  . sodium chloride   Intravenous Once  . B-complex with vitamin C  1 tablet Per Tube Daily  . chlorhexidine gluconate (MEDLINE KIT)  15 mL Mouth Rinse BID  . Chlorhexidine Gluconate Cloth  6 each Topical Daily  . clonazepam  1 mg Per Tube BID  . collagenase   Topical Daily  . darbepoetin (ARANESP) injection - DIALYSIS  60 mcg Subcutaneous Q Tue-1800  . feeding supplement (PROSource TF)  90 mL Per Tube TID  . insulin aspart  0-20 Units Subcutaneous Q4H  . insulin aspart  8 Units Subcutaneous Q4H  . insulin glargine  25 Units Subcutaneous Daily  . mouth rinse  15 mL Mouth Rinse QID  . midodrine  20 mg Per Tube TID WC  . oxyCODONE  5 mg Per Tube Q8H  . pantoprazole sodium  40 mg Per Tube BID  . QUEtiapine  100 mg Per Tube BID  . sodium chloride flush  10-40 mL Intracatheter Q12H      Otelia Santee, MD 10/04/2020, 10:43 AM

## 2020-10-05 DIAGNOSIS — J9601 Acute respiratory failure with hypoxia: Secondary | ICD-10-CM

## 2020-10-05 LAB — RENAL FUNCTION PANEL
Albumin: 1.3 g/dL — ABNORMAL LOW (ref 3.5–5.0)
Albumin: 1.3 g/dL — ABNORMAL LOW (ref 3.5–5.0)
Anion gap: 8 (ref 5–15)
Anion gap: 10 (ref 5–15)
BUN: 29 mg/dL — ABNORMAL HIGH (ref 8–23)
BUN: 27 mg/dL — ABNORMAL HIGH (ref 8–23)
CO2: 23 mmol/L (ref 22–32)
CO2: 24 mmol/L (ref 22–32)
Calcium: 8.6 mg/dL — ABNORMAL LOW (ref 8.9–10.3)
Calcium: 7.9 mg/dL — ABNORMAL LOW (ref 8.9–10.3)
Chloride: 105 mmol/L (ref 98–111)
Chloride: 104 mmol/L (ref 98–111)
Creatinine, Ser: 0.48 mg/dL (ref 0.44–1.00)
Creatinine, Ser: 0.41 mg/dL — ABNORMAL LOW (ref 0.44–1.00)
GFR, Estimated: >60 mL/min (ref >60)
GFR, Estimated: >60 mL/min (ref >60)
Glucose, Bld: 166 mg/dL — ABNORMAL HIGH (ref 70–99)
Glucose, Bld: 121 mg/dL — ABNORMAL HIGH (ref 70–99)
Phosphorus: 3.4 mg/dL (ref 2.5–4.6)
Phosphorus: 4.5 mg/dL (ref 2.5–4.6)
Potassium: 4.6 mmol/L (ref 3.5–5.1)
Potassium: 4.8 mmol/L (ref 3.5–5.1)
Sodium: 136 mmol/L (ref 135–145)
Sodium: 138 mmol/L (ref 135–145)

## 2020-10-05 LAB — GLUCOSE, CAPILLARY
Glucose-Capillary: 213 mg/dL — ABNORMAL HIGH (ref 70–99)
Glucose-Capillary: 111 mg/dL — ABNORMAL HIGH (ref 70–99)
Glucose-Capillary: 209 mg/dL — ABNORMAL HIGH (ref 70–99)
Glucose-Capillary: 108 mg/dL — ABNORMAL HIGH (ref 70–99)
Glucose-Capillary: 113 mg/dL — ABNORMAL HIGH (ref 70–99)

## 2020-10-05 LAB — HEPARIN LEVEL (UNFRACTIONATED): Heparin Unfractionated: >2.2 IU/mL — ABNORMAL HIGH (ref 0.30–0.70)

## 2020-10-05 LAB — POCT I-STAT 7, (LYTES, BLD GAS, ICA,H+H)
Acid-base deficit: 9 mmol/L — ABNORMAL HIGH (ref 0.0–2.0)
Bicarbonate: 21.6 mmol/L (ref 20.0–28.0)
Calcium, Ion: 1.33 mmol/L (ref 1.15–1.40)
HCT: 28 % — ABNORMAL LOW (ref 36.0–46.0)
Hemoglobin: 9.5 g/dL — ABNORMAL LOW (ref 12.0–15.0)
O2 Saturation: 16 %
Potassium: 5.8 mmol/L — ABNORMAL HIGH (ref 3.5–5.1)
Sodium: 134 mmol/L — ABNORMAL LOW (ref 135–145)
TCO2: 24 mmol/L (ref 22–32)
pCO2 arterial: 72 mmHg (ref 32.0–48.0)
pH, Arterial: 7.084 — CL (ref 7.350–7.450)
pO2, Arterial: 19 mmHg — CL (ref 83.0–108.0)

## 2020-10-05 LAB — CBC
HCT: 24.2 % — ABNORMAL LOW (ref 36.0–46.0)
Hemoglobin: 7.7 g/dL — ABNORMAL LOW (ref 12.0–15.0)
MCH: 30.1 pg (ref 26.0–34.0)
MCHC: 31.8 g/dL (ref 30.0–36.0)
MCV: 94.5 fL (ref 80.0–100.0)
Platelets: 60 10*3/uL — ABNORMAL LOW (ref 150–400)
RBC: 2.56 MIL/uL — ABNORMAL LOW (ref 3.87–5.11)
RDW: 17.2 % — ABNORMAL HIGH (ref 11.5–15.5)
WBC: 8.8 10*3/uL (ref 4.0–10.5)
nRBC: 1.8 % — ABNORMAL HIGH (ref 0.0–0.2)

## 2020-10-05 LAB — MAGNESIUM: Magnesium: 2.3 mg/dL (ref 1.7–2.4)

## 2020-10-05 MED ORDER — HEPARIN (PORCINE) 25000 UT/250ML-% IV SOLN
1500.0000 [IU]/h | INTRAVENOUS | Status: AC
  Administered 2020-10-06: 1000 [IU]/h via INTRAVENOUS
  Administered 2020-10-08: 05:00:00 1200 [IU]/h via INTRAVENOUS
  Administered 2020-10-09: 04:00:00 1150 [IU]/h via INTRAVENOUS
  Administered 2020-10-10 – 2020-10-12 (×4): 1350 [IU]/h via INTRAVENOUS
  Administered 2020-10-13: 11:00:00 1300 [IU]/h via INTRAVENOUS
  Administered 2020-10-14: 05:00:00 1350 [IU]/h via INTRAVENOUS
  Administered 2020-10-15 (×2): 1400 [IU]/h via INTRAVENOUS
  Administered 2020-10-16: 16:00:00 1300 [IU]/h via INTRAVENOUS
  Administered 2020-10-17: 12:00:00 1150 [IU]/h via INTRAVENOUS
  Administered 2020-10-18 – 2020-10-22 (×5): 1200 [IU]/h via INTRAVENOUS
  Administered 2020-10-23: 08:00:00 1300 [IU]/h via INTRAVENOUS
  Administered 2020-10-24: 18:00:00 1350 [IU]/h via INTRAVENOUS
  Administered 2020-10-24: 01:00:00 1400 [IU]/h via INTRAVENOUS
  Administered 2020-10-25 – 2020-11-01 (×11): 1300 [IU]/h via INTRAVENOUS
  Administered 2020-11-03 – 2020-11-07 (×6): 1250 [IU]/h via INTRAVENOUS
  Administered 2020-11-08: 12:00:00 1300 [IU]/h via INTRAVENOUS
  Administered 2020-11-09 (×2): 1450 [IU]/h via INTRAVENOUS
  Filled 2020-10-05 (×46): qty 250

## 2020-10-05 MED ORDER — FENTANYL CITRATE (PF) 100 MCG/2ML IJ SOLN: 100.0000 ug | INTRAMUSCULAR | Status: AC

## 2020-10-05 MED ORDER — FENTANYL CITRATE (PF) 100 MCG/2ML IJ SOLN
INTRAMUSCULAR | Status: AC
  Administered 2020-10-05: 100 ug via INTRAVENOUS
  Filled 2020-10-05: qty 2

## 2020-10-05 MED ORDER — VASOPRESSIN 20 UNITS/100 ML INFUSION FOR SHOCK
0.0000 [IU]/min | INTRAVENOUS | Status: DC
  Administered 2020-10-05 – 2020-10-12 (×10): 0.03 [IU]/min via INTRAVENOUS
  Filled 2020-10-05 (×10): qty 100

## 2020-10-05 MED ORDER — SODIUM BICARBONATE 8.4 % IV SOLN: 100.0000 meq | INTRAVENOUS | Status: AC

## 2020-10-05 MED ORDER — ATROPINE SULFATE 1 MG/10ML IJ SOSY
PREFILLED_SYRINGE | INTRAMUSCULAR | Status: AC
  Filled 2020-10-05: qty 10

## 2020-10-05 MED ORDER — MIDAZOLAM HCL 2 MG/2ML IJ SOLN
INTRAMUSCULAR | Status: AC
  Administered 2020-10-05: 2 mg via INTRAVENOUS
  Filled 2020-10-05: qty 2

## 2020-10-05 MED ORDER — MIDAZOLAM HCL 2 MG/2ML IJ SOLN: 2.0000 mg | INTRAMUSCULAR | Status: AC

## 2020-10-05 MED ORDER — SODIUM BICARBONATE 8.4 % IV SOLN: 100.0000 meq | Freq: Once | INTRAVENOUS | Status: AC

## 2020-10-05 MED ORDER — HEPARIN (PORCINE) 25000 UT/250ML-% IV SOLN
1200.0000 [IU]/h | INTRAVENOUS | Status: DC
  Administered 2020-10-05: 1200 [IU]/h via INTRAVENOUS
  Filled 2020-10-05: qty 250

## 2020-10-05 MED ORDER — SODIUM BICARBONATE 8.4 % IV SOLN
INTRAVENOUS | Status: AC
  Filled 2020-10-05: qty 100

## 2020-10-05 MED ORDER — SODIUM BICARBONATE 8.4 % IV SOLN
INTRAVENOUS | Status: AC
  Administered 2020-10-05: 100 meq via INTRAVENOUS
  Filled 2020-10-05: qty 100

## 2020-10-05 MED ORDER — FENTANYL 2500MCG IN NS 250ML (10MCG/ML) PREMIX INFUSION
0.0000 ug/h | INTRAVENOUS | Status: DC
  Administered 2020-10-05 – 2020-10-06 (×2): 100 ug/h via INTRAVENOUS
  Administered 2020-10-06: 11:00:00 400 ug/h via INTRAVENOUS
  Administered 2020-10-07 – 2020-10-08 (×2): 175 ug/h via INTRAVENOUS
  Administered 2020-10-08: 15:00:00 190 ug/h via INTRAVENOUS
  Filled 2020-10-05 (×7): qty 250

## 2020-10-05 MED ORDER — EPINEPHRINE 1 MG/10ML IJ SOSY
PREFILLED_SYRINGE | INTRAMUSCULAR | Status: AC
  Filled 2020-10-05: qty 10

## 2020-10-05 NOTE — Progress Notes (Signed)
ANTICOAGULATION CONSULT NOTE - Initial Consult  Pharmacy Consult for heparin Indication: atrial fibrillation  No Known Allergies  Patient Measurements: Height: 5\' 8"  (172.7 cm) Weight: 84.2 kg (185 lb 10 oz) IBW/kg (Calculated) : 63.9 Heparin Dosing Weight: 81kg  Vital Signs: Temp: 96.8 F (36 C) (12/07 0737) Temp Source: Axillary (12/07 0737) BP: 94/60 (12/07 0737) Pulse Rate: 96 (12/07 0722)  Labs: Recent Labs    10/03/20 2223 10/03/20 2223 10/04/20 0516 10/04/20 1650 10/05/20 0505  HGB 6.4*   < > 8.8*  --  7.7*  HCT 21.5*  --  27.7*  --  24.2*  PLT 68*  --  75*  --  60*  CREATININE  --    < > 0.40* 0.47 0.48   < > = values in this interval not displayed.    Estimated Creatinine Clearance: 75.4 mL/min (by C-G formula based on SCr of 0.48 mg/dL).   Medical History: Past Medical History:  Diagnosis Date  . Colon cancer ([place])   . Diabetes mellitus ([place])   . Hyperlipemia   . Hypertension    Assessment: 62 YOF with hx of afib on Eliquis PTA, reversed with Kcentra 11/3 for repair of duodenal ulcer, was transitioned to lovenox which has been held this admission d/t thrombocytopenia/anemia.  H/H and plts have remained stable with plans to continue anticoagulation per CCM, pt also on CCRT with incidence of filter clotting.  Will use low end goals for now.  Goal of Therapy:  Heparin level 0.3-0.5 units/ml Monitor platelets by anticoagulation protocol: Yes   Plan:  Heparin gtt at 1200 units/hr, no bolus F/u 6 hour heparin level Daily heparin level, CBC, s/s bleeding  Bertis Ruddy, PharmD Clinical Pharmacist Please check AMION for all [place] numbers 10/05/2020 8:11 AM

## 2020-10-05 NOTE — Progress Notes (Signed)
CRRT restarted

## 2020-10-05 NOTE — Progress Notes (Signed)
NAMECatalyna [name], MRN:  [id number], DOB:  [birth year], LOS: 22 ADMISSION DATE:  08/31/2020, CONSULTATION DATE:  09/07/2020 REFERRING MD:  Dr Candiss Norse, CHIEF COMPLAINT:  Acute resp failure  Brief History   69 year old female who was previously diagnosed with Covid 08/23/2020.  Admitted 11/2 with AF-RVR, found to have a perforated duodenal underwent exploratory laparotomy with Phillip Heal patch placement.  Past Medical History  Covid pneumonia Atrial fibrillation CKD stage III Diabetes mellitus Hypertension Colon cancer Hyperlipidemia  Significant Hospital Events   11/2 Admitted  11/3 OR -> perforated duodenal ulcer 11/10 progressive hemorrhagic shock, intubated, transfused, pressors, proned; started on CRRT in PM 11/16 Extubated. Re-intubated overnight due to respiratory distress and hypoxia with decreased mentation 11/18  bronch'd/ cultures sent 11/19 hgb down getting blood 09/22/2020 spiked fever resume empirical antimicrobial therapy 11/26: hemorrhagic shock, hgb 5.6, increased pressors, CT A/P   Consults:  Cardiology CCS Nephrology   Procedures:  11/3 Exploratory laparotomy, Phillip Heal patch, lysis of adhesion for duodenal ulceration postop day 6  R PICC 11/5 >> A line 11/9 >> out ETT 11/9 > 11/16, 11/16 >> 09/21/2020 09/21/2020 tracheostomy>> Lt [place] CVL 11/9 >> R IJ trialysis >> out HD catheter 12/1 >>   Significant Diagnostic Tests:  11/3 CT abd/ pelvis > 1. Positive for bowel perforation: Pneumoperitoneum and intermediate density free fluid in the abdomen. Prior total colectomy. The specific site of perforation is unclear-oral contrast present to the proximal jejunum has not obviously leaked. Note that there may be small bowel loops adherent to the ventral abdominal wall along the greater curve of the stomach. 2. Extensive bilateral lower lung pneumonia. No pleural effusion. 3. Other abdominal and pelvic viscera are stable since 2015, including bilateral adrenal adenomas.  Chronic renal parapelvic cysts. 4. Aortic Atherosclerosis 11/3 TTE > EF 70-75%, RV not well visualized, mildly reduced RV systolic function 68/11 CT chest/ abd/ pelvis> 1. Interval progression of diffuse bilateral hazy ground-glass airspace opacities with more focal areas of consolidation at the lung bases 2. Trace bilateral pleural effusions. 3. Postsurgical changes the abdomen as detailed above. No evidence for a postoperative abscess, however evaluation is limited by lack of IV contrast. 4. There is a 1.9 cm cystic appearing lesion located in the pancreatic body. This was not present on the patient's CT from 2015.  Follow-up with an outpatient contrast enhanced MRI is recommended. 5. The endometrial stripe appears diffusely thickened. Follow-up with pelvic ultrasound is recommended. Aortic Atherosclerosis 11/14 LE doppler studies > + DVT of right posterior tibial and peroneal vein, +dVT of left posterior tibial vein  11/26 CT ABD PEL > liver unremarkable, distended gallbladder with layering tiny gallstones versus sludge, no duct dilatation, mild hyperdensity right upper pole renal collecting system new.  No evidence of retroperitoneal bleeding.  Multifocal lower lobe predominant pulmonary infiltrates/pneumonia.  Small left pleural effusion.  Micro Data:  11/10 MRSA PCR > neg 11/10 BC x 2 > neg 09/22/2020 blood cultures x2>> S epi.  09/22/2020 sputum culture>> MRSA Blood 12/1 >> negative   Antimicrobials:  azithro 11/2 >11/3 Ceftriaxone 11/2  Fluconazole 11/3 Zosyn 11/3 >> 11/7 Vanc 11/10 off Cefepime 11/10 > 11/16 09/22/2020 vancomycin for MRSA PNA >> 12/2   Subjective:  Patient remains on high-dose vasopressors and on CRRT She was started on Precedex yesterday for tachypnea and discomfort  Objective   Blood pressure 103/70, pulse (!) 113, temperature (!) 96.8 F (36 C), temperature source Axillary, resp. rate (!) 39, height 5\' 8"  (1.727 m), weight 84.2 kg, SpO2  97 %. CVP:   [4 mmHg-5 mmHg] 4 mmHg  Vent Mode: PRVC FiO2 (%):  [50 %-60 %] 50 % Set Rate:  [26 bmp] 26 bmp Vt Set:  [510 mL] 510 mL PEEP:  [5 cmH20] 5 cmH20 Plateau Pressure:  [25 cmH20-44 cmH20] 28 cmH20   Intake/Output Summary (Last 24 hours) at 10/05/2020 0902 Last data filed at 10/05/2020 0800 Gross per 24 hour  Intake 1978.79 ml  Output 2153 ml  Net -174.21 ml   Filed Weights   10/03/20 0337 10/04/20 0412 10/05/20 0408  Weight: 86.7 kg 86.7 kg 84.2 kg   Physical Exam: General: Critically ill elderly F, s/p trach, lying supine in bed HENT: Trach secure. Anicteric sclera. Pink mm. Trachea midline. Respiratory: Symmetrical chest expansion. Diminished bibasilar sounds, mechanically ventilated  Cardiovascular: rrr s1s2 no rgm  GI: Ileostomy. Soft round ndnt Extremities: Generalized anasarca with 3+ pitting edema, No clubbing Neuro: Grimaces to noxious stimuli.  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, status post tracheostomy Complicated by healthcare associated pneumonia due to MRSA Continue current mechanical ventilation setting, she is unable to tolerate pressure support trial due to tachypnea Completed therapy for healthcare associated pneumonia  Multifactorial shock Previously hemorrhagic shock requiring PRBC, Septic shock, MRSA pneumonia, treated for 8 days 1/4 bottles BCX with Meth R Staph Epi, likely contaminant  Patient continued to require high-dose vasopressors, unable to wean off Currently on Levophed 25 mics, continue to titrate with map goal 60 Continue midodrine to 20 mg  3 times daily Completed full course of vancomycin 12/2 for MRSA pneumonia.  Continue contact isolation Monitor PRBC with goal hemoglobin is between 7 and 8  Anuric AKI on CRRT Currently on CRRT Nephrology is following Would like to transition to intermittent HD but unable to do so due to shock state, pressor needs  Duodenal ulcer perforation status post ex lap and Graham's patch Appreciate CTS assistance and management Ultimately would need PEG for continued ongoing aggressive care, however we are unable to do  so currently because gastric insufflation would put her duodenal patch at risk.  CCS has discussed this with family   Paroxysmal A. fib, currently in sinus rhythm Patient is in sinus rhythm now We will restart heparin infusion for stroke prophylaxis  Thrombocytopenia - Patient's platelet counts are stable now  Diabetes type 2 Blood sugars are better controlled now Continue Lantus and sliding scale insulin   1.9 cm cystic appearing lesion located in the pancreatic body Plan -No intervention at this time, plan outpatient follow-up  History of stage IV colon cancer Status post pulmonary [facility]ctom[facility] + Chemo No intervention at this time  Indeterminate bilateral DVT Restarted on heparin infusion  Best practice:  Diet: Tube feeds Pain/Anxiety/Delirium protocol (if indicated) Precedex, oxycodone per tube, clonazepam VAP protocol (if indicated): yes DVT prophylaxis: IV heparin GI prophylaxis: Protonix Glucose control: lantus + ssi  Mobility: Bedrest Code Status: Full Goals of care discussion 12/7: Spoke with patient's daughter at bedside today, explained that patient is going into multiorgan failure and likely she will not survive this admission considering she continued to require high-dose pressors and unable to come off  of ventilator and CRRT.  She would like to keep patient full code and continue aggressive care  Disposition: ICU   Total critical care  time: 35 minutes  Performed by: [place] care time was exclusive of separately billable procedures and treating other patients.   Critical care was necessary to treat or prevent imminent or life-threatening deterioration.   Critical care was time spent personally by me on the following activities: development of treatment plan with patient and/or surrogate as well as nursing, discussions with consultants, evaluation of patient's response to treatment, examination of patient, obtaining history from patient or surrogate, ordering and performing treatments and interventions, ordering and review of laboratory studies, ordering and review of radiographic studies, pulse oximetry and re-evaluation of patient's condition.   Jacky Kindle MD [place] [facility] Pager: [phone] Mobile: [phone]

## 2020-10-05 NOTE — Progress Notes (Signed)
ANTICOAGULATION CONSULT NOTE - Initial Consult  Pharmacy Consult for heparin Indication: atrial fibrillation  No Known Allergies  Patient Measurements: Height: 5\' 8"  (172.7 cm) Weight: 84.2 kg (185 lb 10 oz) IBW/kg (Calculated) : 63.9 Heparin Dosing Weight: 81kg  Vital Signs: Temp: 97.5 F (36.4 C) (12/07 1415) Temp Source: Oral (12/07 1415) BP: 111/74 (12/07 1700) Pulse Rate: 126 (12/07 1700)  Labs: Recent Labs    10/03/20 2223 10/03/20 2223 10/04/20 0516 10/04/20 0516 10/04/20 1650 10/05/20 0505 10/05/20 1447 10/05/20 1545  HGB 6.4*   < > 8.8*   < >  --  7.7* 9.5*  --   HCT 21.5*   < > 27.7*  --   --  24.2* 28.0*  --   PLT 68*  --  75*  --   --  60*  --   --   HEPARINUNFRC  --   --   --   --   --   --   --  >2.20*  CREATININE  --    < > 0.40*   < > 0.47 0.48  --  0.41*   < > = values in this interval not displayed.    Estimated Creatinine Clearance: 75.4 mL/min (A) (by C-G formula based on SCr of 0.41 mg/dL (L)).   Medical History: Past Medical History:  Diagnosis Date  . Colon cancer ([place])   . Diabetes mellitus ([place])   . Hyperlipemia   . Hypertension    Assessment: 10 YOF with hx of afib on Eliquis PTA, reversed with Kcentra 11/3 for repair of duodenal ulcer, was transitioned to lovenox which has been held this admission d/t thrombocytopenia/anemia.  H/H and plts have remained stable with plans to continue anticoagulation per CCM, pt also on CCRT with incidence of filter clotting.  Will use low end goals for now.  Heparin level supratherapeutic  Goal of Therapy:  Heparin level 0.3-0.5 units/ml Monitor platelets by anticoagulation protocol: Yes   Plan:  Hold heparin for 1 hour, then decrease Heparin gtt to 1000 units/hr, no bolus F/u 6 hour heparin level Daily heparin level, CBC, s/s bleeding  Alanda Slim, PharmD, [facility] Clinical Pharmacist Please see AMION for all Pharmacists' Contact Phone Numbers 10/05/2020, 5:05 PM

## 2020-10-05 NOTE — Progress Notes (Signed)
CRRT stopped to replace clotted filter. 87ml blood returned to patient

## 2020-10-05 NOTE — Progress Notes (Signed)
[facility] Progress Note     Assessment/ Plan:   1. AKI- oliguric presumably due to ischemic ATN in setting of hemorrhagic shock. Started on CRRT 09/08/20 due to persistent oliguria and hyperkalemia.Failed attempt at IHD Sun AM  1. All fluids 4K/2.5Ca: Pre-filter 400 ml/hr, post-filter 300 ml/hr, dialysate 1,500 ml/hr 2. Basically keeping even because of the soft pressures. 3. Continue with current CRRT settings.  4. Let's stop the CRRT tomorrow and see where the patient is at; if she clots off overnight then will not restart. Currently anuric on CRRT. 2. Hemorrhagic shock- ongoing blood loss and requiring pressors. 1. Transfuse as needed 3. Septic shock due to MRSA pneumonia. 4. Perforated duodenal ulcer- s/p exploratory lap and [place] patch placement per surgery. 5. Acute hypoxic/hypercapnic respiratory failure due to ARDS from Recent Covid-19 PNA- currently on vent via trach per PCCM.  6. Thrombocytopenia- follow 7. P. Atrial fibrillation- no anticoagulation due to ongoing GI bleed. 8. Vascular access- RIJ trialysis catheter 09/08/20 then removed. Left subclavian trialysis catheter placed 09/15/20 andremoved and new right [place] HD catheter placed on 09/29/20. 1. Holding off on [facility] due to ongoing infection. 9. Severe protein malnutrition- per primary svc. 10. Disposition- poor overall prognosis and will likely require LTC and chronic vent support. PCCM to discuss goals of care with family.  Subjective:   Remains on Levo and pressures are very soft. Daughter is bedside; I spoke with her as well as another sister on the phone giving them an update.   Objective:   BP (!) 80/54   Pulse (!) 138   Temp (!) 96.8 F (36 C) (Axillary)   Resp (!) 39   Ht 5' 8"  (1.727 m)   Wt 84.2 kg   SpO2 92%   BMI 28.22 kg/m   Intake/Output Summary (Last 24 hours) at 10/05/2020 1201 Last data filed at 10/05/2020 1100 Gross per 24 hour  Intake 2071.15 ml  Output 2268 ml  Net  -196.85 ml   Weight change: -2.5 kg  Physical Exam: Gen:on vent via trach, opens eyes and can nod or shake head [id number] at 107 Resp:ventilated BS bilaterally Abd:+BS, soft, NT/ND Ext:1+ edema of upper and lower extremities Access: Rt scv temp  Imaging: DG Chest 1 View  Result Date: 10/04/2020 CLINICAL DATA:  Acute respiratory distress. EXAM: CHEST  1 VIEW COMPARISON:  09/29/2020 FINDINGS: Tracheostomy tube overlies the airway. A feeding tube courses into the abdomen with tip not imaged. A right subclavian catheter and right PICC remain in place. The cardiomediastinal silhouette is unchanged. Consolidation in the mid to lower lungs bilaterally is unchanged, and there are persistent small bilateral pleural effusions. No pneumothorax is identified. IMPRESSION: Unchanged bilateral mid and lower lung airspace consolidation and small pleural effusions. Electronically Signed   By: Logan Bores M.D.   On: 10/04/2020 16:03    Labs: BMET Recent Labs  Lab 10/02/20 0520 10/02/20 1655 10/03/20 0312 10/03/20 1730 10/04/20 0516 10/04/20 1650 10/05/20 0505  NA 137 137 136 135 135 135 136  K 5.1 4.5 4.5 4.8 4.7 4.9 4.6  CL 102 106 105 105 103 104 105  CO2 25 24 24  21* 25 23 23   GLUCOSE 202* 127* 86 281* 120* 163* 166*  BUN 31* 29* 28* 24* 25* 28* 29*  CREATININE 0.46 0.41* 0.36* 0.47 0.40* 0.47 0.48  CALCIUM 8.8* 8.4* 8.4* 7.9* 8.5* 7.6* 8.6*  PHOS 3.4 2.8 2.3* 2.7 2.4* 3.0 3.4   CBC Recent Labs  Lab 10/03/20 0312 10/03/20 2223  10/04/20 0516 10/05/20 0505  WBC 8.0 6.3 6.3 8.8  NEUTROABS  --   --  5.2  --   HGB 7.2* 6.4* 8.8* 7.7*  HCT 24.1* 21.5* 27.7* 24.2*  MCV 95.3 96.0 93.6 94.5  PLT 49* 68* 75* 60*    Medications:    . sodium chloride   Intravenous Once  . sodium chloride   Intravenous Once  . B-complex with vitamin C  1 tablet Per Tube Daily  . chlorhexidine gluconate (MEDLINE KIT)  15 mL Mouth Rinse BID  . Chlorhexidine Gluconate Cloth  6 each Topical Daily  .  clonazepam  1 mg Per Tube BID  . collagenase   Topical Daily  . darbepoetin (ARANESP) injection - DIALYSIS  60 mcg Subcutaneous Q Tue-1800  . feeding supplement (PROSource TF)  90 mL Per Tube TID  . insulin aspart  0-20 Units Subcutaneous Q4H  . insulin aspart  8 Units Subcutaneous Q4H  . insulin glargine  25 Units Subcutaneous Daily  . mouth rinse  15 mL Mouth Rinse QID  . midodrine  20 mg Per Tube TID WC  . oxyCODONE  5 mg Per Tube Q8H  . pantoprazole sodium  40 mg Per Tube BID  . QUEtiapine  100 mg Per Tube BID  . sodium chloride flush  10-40 mL Intracatheter Q12H      Otelia Santee, MD 10/05/2020, 12:01 PM

## 2020-10-05 NOTE — Progress Notes (Signed)
I was called into patient's room because she was hypoxic and hypotensive.  Ventilator setting was adjusted, patient was placed on 100% FiO2 and PEEP of 10, without improvement in oxygenation, her O2 sat remained in 70s.  With increasing PEEP her oxygen saturation further dropped so PEEP was kept at 10.  She is currently on 40 mics of Levophed, vasopressin was added. ABG was done showed pH 7.0, PCO2 72 and PO2 of 25 and O2 sat 78%. She is being given 2 A of bicarbonate She was given 100 mics of fentanyl bolus followed by fentanyl infusion to help with vent dyssynchrony, she also received 2 mg of Versed.  Currently she is well centered with the vent but her oxygen saturation remain between 75 and 80%.  Patient's daughter was at bedside and other daughter was on the phone, updated about her condition that there is a possibility patient's heart will stop because she is unable to maintain oxygen saturation.  Prognosis guarded  Total additional critical care time: 66 minutes  Performed by: [place] care time was exclusive of separately billable procedures and treating other patients.   Critical care was necessary to treat or prevent imminent or life-threatening deterioration.   Critical care was time spent personally by me on the following activities: development of treatment plan with patient and/or surrogate as well as nursing, discussions with consultants, evaluation of patient's response to treatment, examination of patient, obtaining history from patient or surrogate, ordering and performing treatments and interventions, ordering and review of laboratory studies, ordering and review of radiographic studies, pulse oximetry and re-evaluation of patient's condition.   Jacky Kindle MD [place] [facility] Pager: [phone] Mobile: [phone]

## 2020-10-06 DIAGNOSIS — J9601 Acute respiratory failure with hypoxia: Secondary | ICD-10-CM

## 2020-10-06 LAB — CBC
HCT: 21.6 % — ABNORMAL LOW (ref 36.0–46.0)
Hemoglobin: 6.6 g/dL — CL (ref 12.0–15.0)
MCH: 30.1 pg (ref 26.0–34.0)
MCHC: 30.6 g/dL (ref 30.0–36.0)
MCV: 98.6 fL (ref 80.0–100.0)
Platelets: 118 10*3/uL — ABNORMAL LOW (ref 150–400)
RBC: 2.19 MIL/uL — ABNORMAL LOW (ref 3.87–5.11)
RDW: 17.3 % — ABNORMAL HIGH (ref 11.5–15.5)
WBC: 15.4 10*3/uL — ABNORMAL HIGH (ref 4.0–10.5)
nRBC: 2.9 % — ABNORMAL HIGH (ref 0.0–0.2)

## 2020-10-06 LAB — HEMOGLOBIN AND HEMATOCRIT, BLOOD
HCT: 19.9 % — ABNORMAL LOW (ref 36.0–46.0)
HCT: 23.1 % — ABNORMAL LOW (ref 36.0–46.0)
Hemoglobin: 5.8 g/dL — CL (ref 12.0–15.0)
Hemoglobin: 6.9 g/dL — CL (ref 12.0–15.0)

## 2020-10-06 LAB — GLUCOSE, CAPILLARY
Glucose-Capillary: 108 mg/dL — ABNORMAL HIGH (ref 70–99)
Glucose-Capillary: 117 mg/dL — ABNORMAL HIGH (ref 70–99)
Glucose-Capillary: 134 mg/dL — ABNORMAL HIGH (ref 70–99)
Glucose-Capillary: 140 mg/dL — ABNORMAL HIGH (ref 70–99)
Glucose-Capillary: 145 mg/dL — ABNORMAL HIGH (ref 70–99)
Glucose-Capillary: 171 mg/dL — ABNORMAL HIGH (ref 70–99)
Glucose-Capillary: 60 mg/dL — ABNORMAL LOW (ref 70–99)

## 2020-10-06 LAB — POCT I-STAT 7, (LYTES, BLD GAS, ICA,H+H)
Acid-Base Excess: 1 mmol/L (ref 0.0–2.0)
Bicarbonate: 27.9 mmol/L (ref 20.0–28.0)
Calcium, Ion: 1.34 mmol/L (ref 1.15–1.40)
HCT: 22 % — ABNORMAL LOW (ref 36.0–46.0)
Hemoglobin: 7.5 g/dL — ABNORMAL LOW (ref 12.0–15.0)
O2 Saturation: 99 %
Potassium: 5.2 mmol/L — ABNORMAL HIGH (ref 3.5–5.1)
Sodium: 137 mmol/L (ref 135–145)
TCO2: 30 mmol/L (ref 22–32)
pCO2 arterial: 59.6 mmHg — ABNORMAL HIGH (ref 32.0–48.0)
pH, Arterial: 7.278 — ABNORMAL LOW (ref 7.350–7.450)
pO2, Arterial: 184 mmHg — ABNORMAL HIGH (ref 83.0–108.0)

## 2020-10-06 LAB — RENAL FUNCTION PANEL
Albumin: 1.2 g/dL — ABNORMAL LOW (ref 3.5–5.0)
Albumin: 1.8 g/dL — ABNORMAL LOW (ref 3.5–5.0)
Anion gap: 9 (ref 5–15)
Anion gap: 9 (ref 5–15)
BUN: 27 mg/dL — ABNORMAL HIGH (ref 8–23)
BUN: 39 mg/dL — ABNORMAL HIGH (ref 8–23)
CO2: 23 mmol/L (ref 22–32)
CO2: 22 mmol/L (ref 22–32)
Calcium: 7.8 mg/dL — ABNORMAL LOW (ref 8.9–10.3)
Calcium: 7.8 mg/dL — ABNORMAL LOW (ref 8.9–10.3)
Chloride: 106 mmol/L (ref 98–111)
Chloride: 106 mmol/L (ref 98–111)
Creatinine, Ser: 0.34 mg/dL — ABNORMAL LOW (ref 0.44–1.00)
Creatinine, Ser: 0.52 mg/dL (ref 0.44–1.00)
GFR, Estimated: >60 mL/min (ref >60)
GFR, Estimated: >60 mL/min (ref >60)
Glucose, Bld: 114 mg/dL — ABNORMAL HIGH (ref 70–99)
Glucose, Bld: 101 mg/dL — ABNORMAL HIGH (ref 70–99)
Phosphorus: 3.4 mg/dL (ref 2.5–4.6)
Phosphorus: 3.7 mg/dL (ref 2.5–4.6)
Potassium: 4.2 mmol/L (ref 3.5–5.1)
Potassium: 4.5 mmol/L (ref 3.5–5.1)
Sodium: 138 mmol/L (ref 135–145)
Sodium: 137 mmol/L (ref 135–145)

## 2020-10-06 LAB — HEPARIN LEVEL (UNFRACTIONATED): Heparin Unfractionated: 0.33 IU/mL (ref 0.30–0.70)

## 2020-10-06 LAB — PREPARE RBC (CROSSMATCH)

## 2020-10-06 LAB — MAGNESIUM: Magnesium: 2.1 mg/dL (ref 1.7–2.4)

## 2020-10-06 LAB — COPPER, SERUM: Copper: 10 ug/dL — ABNORMAL LOW (ref 80–158)

## 2020-10-06 MED ORDER — SODIUM CHLORIDE 0.9% IV SOLUTION: Freq: Once | INTRAVENOUS | Status: DC

## 2020-10-06 MED ORDER — SODIUM CHLORIDE 0.9% IV SOLUTION: Freq: Once | INTRAVENOUS | Status: AC

## 2020-10-06 MED ORDER — ALBUMIN HUMAN 25 % IV SOLN
12.5000 g | Freq: Once | INTRAVENOUS | Status: AC
  Administered 2020-10-06: 12.5 g via INTRAVENOUS
  Filled 2020-10-06: qty 50

## 2020-10-06 MED ORDER — FUROSEMIDE 10 MG/ML IJ SOLN
80.0000 mg | Freq: Once | INTRAMUSCULAR | Status: AC
  Administered 2020-10-06: 80 mg via INTRAVENOUS
  Filled 2020-10-06: qty 8

## 2020-10-06 NOTE — Progress Notes (Signed)
PT Cancellation Note  Patient Details Name: Candace [name] MRN: [id number] DOB: 20-Jun-1951   Cancelled Treatment:    Reason Eval/Treat Not Completed: Medical issues which prohibited therapy Pt with increased O2 need, on pressors, and drop in hgb to 6.6.  Unable/unsafe to participate with PT today.  Will f/u as able.  Abran Richard, PT Acute Rehab Services Pager [phone] [facility] [place] 10/06/2020, 10:09 AM

## 2020-10-06 NOTE — Progress Notes (Signed)
ANTICOAGULATION CONSULT NOTE  Pharmacy Consult for heparin Indication: atrial fibrillation  No Known Allergies  Patient Measurements: Height: 5\' 8"  (172.7 cm) Weight: 84.2 kg (185 lb 10 oz) IBW/kg (Calculated) : 63.9 Heparin Dosing Weight: 81kg  Vital Signs: Temp: 98.3 F (36.8 C) (12/08 0000) Temp Source: Oral (12/08 0000) BP: 83/59 (12/08 0000) Pulse Rate: 123 (12/08 0000)  Labs: Recent Labs    10/03/20 2223 10/03/20 2223 10/04/20 0516 10/04/20 0516 10/04/20 1650 10/05/20 0505 10/05/20 1447 10/05/20 1545 10/06/20 0010  HGB 6.4*   < > 8.8*   < >  --  7.7* 9.5*  --   --   HCT 21.5*   < > 27.7*  --   --  24.2* 28.0*  --   --   PLT 68*  --  75*  --   --  60*  --   --   --   HEPARINUNFRC  --   --   --   --   --   --   --  >2.20* 0.33  CREATININE  --    < > 0.40*   < > 0.47 0.48  --  0.41*  --    < > = values in this interval not displayed.    Estimated Creatinine Clearance: 75.4 mL/min (A) (by C-G formula based on SCr of 0.41 mg/dL (L)).  Assessment: 69 y.o. female with h/o Afib, Eliquis on hold, for heparin Goal of Therapy:  Heparin level 0.3-0.5 units/ml Monitor platelets by anticoagulation protocol: Yes   Plan:  Continue Heparin at current rate  Phillis Knack, PharmD, BCPS  10/06/2020 12:50 AM

## 2020-10-06 NOTE — Progress Notes (Signed)
[facility] Progress Note     Assessment/ Plan:   1. AKI- oliguric presumably due to ischemic ATN in setting of hemorrhagic shock. Started on CRRT 09/08/20 due to persistent oliguria and hyperkalemia.Failed attempt at [facility] AM; CRRT 11/5-11/7. 1. All fluids had been 4K/2.5Ca: Pre-filter 400 ml/hr, post-filter 300 ml/hr, dialysate 1,500 ml/hr when she was on CRRT with very limited UF bec  of the soft pressures. 2. We stopped  CRRT with the issues overnight and will assess daily to see if UOP starts to pick up off CRRT. Basically anuric on CRRT which is not surprising. 3. I anticipate having to restart CRRT in the next 24-48hrs; notified daughters as well. 2. Hemorrhagic shock- ongoing blood loss and requiring pressors. 1. Transfuse as needed 3. Septic shock due to MRSA pneumonia. 4. Perforated duodenal ulcer- s/p exploratory lap and Graham patch placement per surgery. 5. Acute hypoxic/hypercapnic respiratory failure due to ARDS from Recent Covid-19 PNA- currently on vent via trach per PCCM.  6. Thrombocytopenia- follow 7. P. Atrial fibrillation- no anticoagulation due to ongoing GI bleed. 8. Vascular access- RIJ trialysis catheter 09/08/20 then removed. Left subclavian trialysis catheter placed 09/15/20 andremoved and new right  HD catheter placed on 09/29/20. 1. Holding off on [facility] due to ongoing infection. 9. Severe protein malnutrition- per primary svc. 10. Disposition- poor overall prognosis and will likely require LTC and chronic vent support. PCCM to discuss goals of care with family.  Subjective:   Issues with dyssynchrony on vent yest req more sedation; better this am Remains on Levo and  And vaso; pressures are  soft. Daughter is bedside; I spoke with her as well as another sister again on the phone giving them an update.   Objective:   BP 134/73 (BP Location: Left Arm)   Pulse 100   Temp 98.2 F (36.8 C) (Axillary)   Resp (!) 26   Ht _0  (1.727  m)   Wt 86.8 kg   SpO2 100%   BMI 29.10 kg/m   Intake/Output Summary (Last 24 hours) at 10/06/2020 0816 Last data filed at 10/06/2020 0800 Gross per 24 hour  Intake 3499.34 ml  Output 1049 ml  Net 2450.34 ml   Weight change: 2.6 kg  Physical Exam: Gen:on vent via trach, sedated CBJ:SEGBT at 107 Resp:ventilated BS bilaterally Abd:+BS, soft, NT/ND Ext:1+ edema of  lower extremities, 2+ upper ext Access: Rt scv temp  Imaging: DG Chest 1 View  Result Date: 10/04/2020 CLINICAL DATA:  Acute respiratory distress. EXAM: CHEST  1 VIEW COMPARISON:  09/29/2020 FINDINGS: Tracheostomy tube overlies the airway. A feeding tube courses into the abdomen with tip not imaged. A right subclavian catheter and right PICC remain in place. The cardiomediastinal silhouette is unchanged. Consolidation in the mid to lower lungs bilaterally is unchanged, and there are persistent small bilateral pleural effusions. No pneumothorax is identified. IMPRESSION: Unchanged bilateral mid and lower lung airspace consolidation and small pleural effusions. Electronically Signed   By: Logan Bores M.D.   On: 10/04/2020 16:03    Labs: BMET Recent Labs  Lab 10/03/20 0312 10/03/20 0312 10/03/20 1730 10/04/20 0516 10/04/20 1650 10/05/20 0505 10/05/20 1447 10/05/20 1545 10/06/20 0530  NA 136   < > 135 135 135 136 134* 138 138  K 4.5   < > 4.8 4.7 4.9 4.6 5.8* 4.8 4.2  CL 105  --  105 103 104 105  --  104 106  CO2 24  --  21* _1 --  24 23  GLUCOSE 86  --  281* 120* 163* 166*  --  121* 114*  BUN 28*  --  24* 25* 28* 29*  --  27* 27*  CREATININE 0.36*  --  0.47 0.40* 0.47 0.48  --  0.41* 0.34*  CALCIUM 8.4*  --  7.9* 8.5* 7.6* 8.6*  --  7.9* 7.8*  PHOS 2.3*  --  2.7 2.4* 3.0 3.4  --  4.5 3.4   < > = values in this interval not displayed.   CBC Recent Labs  Lab 10/03/20 2223 10/03/20 2223 10/04/20 0516 10/05/20 0505 10/05/20 1447 10/06/20 0530  WBC 6.3  --  6.3 8.8  --  15.4*  NEUTROABS  --    --  5.2  --   --   --   HGB 6.4*   < > 8.8* 7.7* 9.5* 6.6*  HCT 21.5*   < > 27.7* 24.2* 28.0* 21.6*  MCV 96.0  --  93.6 94.5  --  98.6  PLT 68*  --  75* 60*  --  118*   < > = values in this interval not displayed.    Medications:    . sodium chloride   Intravenous Once  . sodium chloride   Intravenous Once  . sodium chloride   Intravenous Once  . B-complex with vitamin C  1 tablet Per Tube Daily  . chlorhexidine gluconate (MEDLINE KIT)  15 mL Mouth Rinse BID  . Chlorhexidine Gluconate Cloth  6 each Topical Daily  . clonazepam  1 mg Per Tube BID  . collagenase   Topical Daily  . darbepoetin (ARANESP) injection - DIALYSIS  60 mcg Subcutaneous Q Tue-1800  . feeding supplement (PROSource TF)  90 mL Per Tube TID  . insulin aspart  0-20 Units Subcutaneous Q4H  . insulin aspart  8 Units Subcutaneous Q4H  . insulin glargine  25 Units Subcutaneous Daily  . mouth rinse  15 mL Mouth Rinse QID  . midodrine  20 mg Per Tube TID WC  . oxyCODONE  5 mg Per Tube Q8H  . pantoprazole sodium  40 mg Per Tube BID  . QUEtiapine  100 mg Per Tube BID  . sodium chloride flush  10-40 mL Intracatheter Q12H      Otelia Santee, MD 10/06/2020, 8:16 AM

## 2020-10-06 NOTE — Progress Notes (Signed)
CRITICAL VALUE ALERT  Critical Value:  Hemoglobin 6.6  Date & Time Notied:  10/06/2020 0640  Provider Notified: Warren Lacy  Orders Received/Actions taken: Awaiting orders

## 2020-10-06 NOTE — Progress Notes (Signed)
OT Cancellation Note  Patient Details Name: Candace [name] MRN: [id number] DOB: 1950/12/29   Cancelled Treatment:    Reason Eval/Treat Not Completed: Medical issues which prohibited therapy. Pt with increased oxygenation needs and on pressors. Will check back at a later time.  Ramond Dial, OT/L   Acute OT Clinical Specialist Acute Rehabilitation Services Pager [phone] Office [phone]  10/06/2020, 10:01 AM

## 2020-10-06 NOTE — Progress Notes (Signed)
NAMEMakyna [name], MRN:  [id number], DOB:  1951/08/30, LOS: 42 ADMISSION DATE:  08/31/2020, CONSULTATION DATE:  09/07/2020 REFERRING MD:  Dr Candiss Norse, CHIEF COMPLAINT:  Acute resp failure  Brief History   69 year old female who was previously diagnosed with Covid 08/23/2020.  Admitted 11/2 with AF-RVR, found to have a perforated duodenal underwent exploratory laparotomy with Phillip Heal patch placement.  Past Medical History  Covid pneumonia Atrial fibrillation CKD stage III Diabetes mellitus Hypertension Colon cancer Hyperlipidemia  Significant Hospital Events   11/2 Admitted  11/3 OR -> perforated duodenal ulcer 11/10 progressive hemorrhagic shock, intubated, transfused, pressors, proned; started on CRRT in PM 11/16 Extubated. Re-intubated overnight due to respiratory distress and hypoxia with decreased mentation 11/18  bronch'd/ cultures sent 11/19 hgb down getting blood 09/22/2020 spiked fever resume empirical antimicrobial therapy 11/26: hemorrhagic shock, hgb 5.6, increased pressors, CT A/P   Consults:  Cardiology [facility] Nephrology   Procedures:  11/3 Exploratory laparotomy, Phillip Heal patch, lysis of adhesion for duodenal ulceration postop day 6  R PICC 11/5 >> A line 11/9 >> out ETT 11/9 > 11/16, 11/16 >> 09/21/2020 09/21/2020 tracheostomy>> Lt [place] CVL 11/9 >> R IJ trialysis >> out HD catheter 12/1 >>   Significant Diagnostic Tests:  11/3 CT abd/ pelvis > 1. Positive for bowel perforation: Pneumoperitoneum and intermediate density free fluid in the abdomen. Prior total colectomy. The specific site of perforation is unclear-oral contrast present to the proximal jejunum has not obviously leaked. Note that there may be small bowel loops adherent to the ventral abdominal wall along the greater curve of the stomach. 2. Extensive bilateral lower lung pneumonia. No pleural effusion. 3. Other abdominal and pelvic viscera are stable since 2015, including bilateral adrenal adenomas.  Chronic renal parapelvic cysts. 4. Aortic Atherosclerosis 11/3 TTE > EF 70-75%, RV not well visualized, mildly reduced RV systolic function 85/92 CT chest/ abd/ pelvis> 1. Interval progression of diffuse bilateral hazy ground-glass airspace opacities with more focal areas of consolidation at the lung bases 2. Trace bilateral pleural effusions. 3. Postsurgical changes the abdomen as detailed above. No evidence for a postoperative abscess, however evaluation is limited by lack of IV contrast. 4. There is a 1.9 cm cystic appearing lesion located in the pancreatic body. This was not present on the patient's CT from 2015.  Follow-up with an outpatient contrast enhanced MRI is recommended. 5. The endometrial stripe appears diffusely thickened. Follow-up with pelvic ultrasound is recommended. Aortic Atherosclerosis 11/14 LE doppler studies > + DVT of right posterior tibial and peroneal vein, +dVT of left posterior tibial vein  11/26 CT ABD PEL > liver unremarkable, distended gallbladder with layering tiny gallstones versus sludge, no duct dilatation, mild hyperdensity right upper pole renal collecting system new.  No evidence of retroperitoneal bleeding.  Multifocal lower lobe predominant pulmonary infiltrates/pneumonia.  Small left pleural effusion.  Micro Data:  11/10 MRSA PCR > neg 11/10 BC x 2 > neg 09/22/2020 blood cultures x2>> S epi.  09/22/2020 sputum culture>> MRSA Blood 12/1 >> negative   Antimicrobials:  azithro 11/2 >11/3 Ceftriaxone 11/2  Fluconazole 11/3 Zosyn 11/3 >> 11/7 Vanc 11/10 off Cefepime 11/10 > 11/16 09/22/2020 vancomycin for MRSA PNA >> 12/2   Subjective:  Patient's condition got worse yesterday, she became hypoxic into 70s, despite being on 100% FiO2 and PEEP of 12 her oxygen saturation did not improve, ABGs were done which shows PaO2 of 19 She received 2 A of bicarbonate, her levo and vasorequirement went up to 40/0.03 respectively Patient's family  was offered to  visit her because of high risk of clinical deterioration. This morning patient's vasopressor requirement is coming down and nephrology recommend stopping CRRT and watching to see if she makes urine.  Objective   Blood pressure (!) 92/52, pulse (!) 118, temperature 98.2 F (36.8 C), temperature source Axillary, resp. rate (!) 39, height 5\' 8"  (1.727 m), weight 86.8 kg, SpO2 96 %. CVP:  [15 mmHg-18 mmHg] 18 mmHg  Vent Mode: PCV FiO2 (%):  [60 %-100 %] 100 % Set Rate:  [26 bmp] 26 bmp Vt Set:  [510 mL] 510 mL PEEP:  [5 HER74-08 cmH20] 12 cmH20 Plateau Pressure:  [30 cmH20-34 cmH20] 30 cmH20   Intake/Output Summary (Last 24 hours) at 10/06/2020 0944 Last data filed at 10/06/2020 0900 Gross per 24 hour  Intake 3695.33 ml  Output 946 ml  Net 2749.33 ml   Filed Weights   10/04/20 0412 10/05/20 0408 10/06/20 0500  Weight: 86.7 kg 84.2 kg 86.8 kg   Physical Exam: General: Critically ill elderly F, s/p trach, lying supine in bed HENT: Trach secure. Anicteric sclera. Pink mm. Trachea midline. Respiratory: Reduced air entry at the bases bilaterally, mechanically ventilated  Cardiovascular: Regular rate and rhythm, no murmur GI: Ileostomy. Soft round ndnt Extremities: Generalized anasarca with 3+ pitting edema, No clubbing Neuro: Grimaces to noxious stimuli.  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, status post tracheostomy Complicated by healthcare associated pneumonia due to MRSA Completed therapy for healthcare associated  pneumonia Currently patient is on 100% FiO2 and PEEP of 12 due to rapid deterioration yesterday We will repeat ABG and try to titrate FiO2 We will send respiratory culture  Multifactorial shock Previously hemorrhagic shock requiring PRBCs, Septic shock, MRSA pneumonia, treated for 8 days 1/4 bottles BCX with Meth R Staph Epi, likely contaminant Acute blood loss anemia  Patient continued to require high-dose vasopressors, unable to wean off Currently on Levophed 25 mics and vasopressin Continue to titrate with map goal 60 Continue midodrine to 20 mg 3 times daily Completed full course of vancomycin 12/2 for MRSA pneumonia.  Continue contact isolation Patient's hemoglobin dropped to 6.6 today, will transfuse 1 more unit with a goal hemoglobin 7-8  AKI Per nephrology CRRT was stopped this morning, will give her trial of Lasix 80 mg x 1 We will place Foley to record urine output accurately   Duodenal ulcer perforation status post ex lap and Graham's patch Appreciate CTS assistance and management Ultimately would need PEG for continued ongoing aggressive care, however we are unable to do  so currently because gastric insufflation would put her duodenal patch at risk.  CCS has discussed this with family   Paroxysmal A. fib, currently in sinus rhythm Patient is in sinus rhythm now Continue heparin infusion for stroke prophylaxis  Thrombocytopenia - Patient's platelet counts are stable now Continue to monitor  Diabetes type 2 Blood sugars are better controlled now Continue Lantus and sliding scale insulin   1.9 cm cystic appearing lesion located in the pancreatic body Plan -No intervention at this time, plan outpatient follow-up  History of stage  IV colon cancer Status post pulmonary metastatectomies at [facility] + Chemo No intervention at this time  Indeterminate bilateral DVT On heparin infusion  Best practice:  Diet: Tube feeds Pain/Anxiety/Delirium protocol (if indicated)  Precedex, fentanyl, oxycodone per tube, clonazepam VAP protocol (if indicated): yes DVT prophylaxis: IV heparin GI prophylaxis: Protonix Glucose control: lantus + ssi  Mobility: Bedrest Code Status: Full Goals of care discussion 12/7: Spoke with patient's daughter at bedside, explained that patient is going into multiorgan failure and likely she will not survive this admission considering she continued to require high-dose pressors and unable to come off of ventilator and CRRT.  She would like to keep patient full code and continue aggressive care  Next goals of care discussion 12/14 Disposition: ICU   Total critical care time: 41 minutes  Performed by: [place] care time was exclusive of separately billable procedures and treating other patients.   Critical care was necessary to treat or prevent imminent or life-threatening deterioration.   Critical care was time spent personally by me on the following activities: development of treatment plan with patient and/or surrogate as well as nursing, discussions with consultants, evaluation of patient's response to treatment, examination of patient, obtaining history from patient or surrogate, ordering and performing treatments and interventions, ordering and review of laboratory studies, ordering and review of radiographic studies, pulse oximetry and re-evaluation of patient's condition.   Jacky Kindle MD [place] [facility] Pager: [phone] Mobile: [phone]

## 2020-10-06 NOTE — Progress Notes (Signed)
eLink Physician-Brief Progress Note Patient Name: Candace [name] DOB: Aug 02, 1951 MRN: [id number]   Date of Service  10/06/2020  HPI/Events of Note  Hemoglobin 6.9 gm / dl.  eICU Interventions  1 unit PRBC ordered to be transfused.        Kerry Kass Seann Genther 10/06/2020, 11:52 PM

## 2020-10-06 NOTE — Progress Notes (Signed)
ABG results given to Dr. Tacy Learn. Verbal order received to wean FiO2. RT will continue to monitor.

## 2020-10-06 NOTE — Progress Notes (Signed)
eLink Physician-Brief Progress Note Patient Name: Candace [name] DOB: [birth year] MRN: [id number]   Date of Service  10/06/2020  HPI/Events of Note  Hemoglobin 6.6 gm / dl.  eICU Interventions  Transfuse 1 unit PRBC.        [name] 10/06/2020, 6:51 AM

## 2020-10-07 LAB — GLUCOSE, CAPILLARY
Glucose-Capillary: 102 mg/dL — ABNORMAL HIGH (ref 70–99)
Glucose-Capillary: 118 mg/dL — ABNORMAL HIGH (ref 70–99)
Glucose-Capillary: 158 mg/dL — ABNORMAL HIGH (ref 70–99)
Glucose-Capillary: 138 mg/dL — ABNORMAL HIGH (ref 70–99)
Glucose-Capillary: 193 mg/dL — ABNORMAL HIGH (ref 70–99)
Glucose-Capillary: 183 mg/dL — ABNORMAL HIGH (ref 70–99)

## 2020-10-07 LAB — CBC
HCT: 29.8 % — ABNORMAL LOW (ref 36.0–46.0)
Hemoglobin: 9.3 g/dL — ABNORMAL LOW (ref 12.0–15.0)
MCH: 30.1 pg (ref 26.0–34.0)
MCHC: 31.2 g/dL (ref 30.0–36.0)
MCV: 96.4 fL (ref 80.0–100.0)
Platelets: 192 K/uL (ref 150–400)
RBC: 3.09 MIL/uL — ABNORMAL LOW (ref 3.87–5.11)
RDW: 16.8 % — ABNORMAL HIGH (ref 11.5–15.5)
WBC: 11.7 K/uL — ABNORMAL HIGH (ref 4.0–10.5)
nRBC: 1.9 % — ABNORMAL HIGH (ref 0.0–0.2)

## 2020-10-07 LAB — RENAL FUNCTION PANEL
Albumin: 1.3 g/dL — ABNORMAL LOW (ref 3.5–5.0)
Albumin: 1.5 g/dL — ABNORMAL LOW (ref 3.5–5.0)
Anion gap: 10 (ref 5–15)
Anion gap: 11 (ref 5–15)
BUN: 59 mg/dL — ABNORMAL HIGH (ref 8–23)
BUN: 73 mg/dL — ABNORMAL HIGH (ref 8–23)
CO2: 20 mmol/L — ABNORMAL LOW (ref 22–32)
CO2: 22 mmol/L (ref 22–32)
Calcium: 8.4 mg/dL — ABNORMAL LOW (ref 8.9–10.3)
Calcium: 9.3 mg/dL (ref 8.9–10.3)
Chloride: 107 mmol/L (ref 98–111)
Chloride: 103 mmol/L (ref 98–111)
Creatinine, Ser: 0.74 mg/dL (ref 0.44–1.00)
Creatinine, Ser: 1.07 mg/dL — ABNORMAL HIGH (ref 0.44–1.00)
GFR, Estimated: >60 mL/min
GFR, Estimated: 56 mL/min — ABNORMAL LOW (ref >60)
Glucose, Bld: 108 mg/dL — ABNORMAL HIGH (ref 70–99)
Glucose, Bld: 153 mg/dL — ABNORMAL HIGH (ref 70–99)
Phosphorus: 5 mg/dL — ABNORMAL HIGH (ref 2.5–4.6)
Phosphorus: 6.8 mg/dL — ABNORMAL HIGH (ref 2.5–4.6)
Potassium: 4.6 mmol/L (ref 3.5–5.1)
Potassium: 6.6 mmol/L (ref 3.5–5.1)
Sodium: 137 mmol/L (ref 135–145)
Sodium: 136 mmol/L (ref 135–145)

## 2020-10-07 LAB — BASIC METABOLIC PANEL
Anion gap: 10 (ref 5–15)
BUN: 78 mg/dL — ABNORMAL HIGH (ref 8–23)
CO2: 19 mmol/L — ABNORMAL LOW (ref 22–32)
Calcium: 8.3 mg/dL — ABNORMAL LOW (ref 8.9–10.3)
Chloride: 107 mmol/L (ref 98–111)
Creatinine, Ser: 1 mg/dL (ref 0.44–1.00)
GFR, Estimated: >60 mL/min (ref >60)
Glucose, Bld: 249 mg/dL — ABNORMAL HIGH (ref 70–99)
Potassium: 5.9 mmol/L — ABNORMAL HIGH (ref 3.5–5.1)
Sodium: 136 mmol/L (ref 135–145)

## 2020-10-07 LAB — HEPARIN LEVEL (UNFRACTIONATED)
Heparin Unfractionated: >2.2 IU/mL — ABNORMAL HIGH (ref 0.30–0.70)
Heparin Unfractionated: 0.22 IU/mL — ABNORMAL LOW (ref 0.30–0.70)
Heparin Unfractionated: 0.11 IU/mL — ABNORMAL LOW (ref 0.30–0.70)

## 2020-10-07 LAB — MAGNESIUM: Magnesium: 2 mg/dL (ref 1.7–2.4)

## 2020-10-07 MED ORDER — SODIUM CHLORIDE 0.9 % FOR CRRT: INTRAVENOUS_CENTRAL | Status: DC | PRN

## 2020-10-07 MED ORDER — PRISMASOL BGK 0/2.5 32-2.5 MEQ/L REPLACEMENT SOLN
Status: DC
  Filled 2020-10-07 (×3): qty 5000

## 2020-10-07 MED ORDER — DEXTROSE 5 % IV SOLN
1.0000 mg | Freq: Every day | INTRAVENOUS | Status: AC
  Administered 2020-10-07 – 2020-10-11 (×5): 1 mg via INTRAVENOUS
  Filled 2020-10-07 (×8): qty 2.5

## 2020-10-07 MED ORDER — PRISMASOL BGK 0/2.5 32-2.5 MEQ/L REPLACEMENT SOLN
Status: DC
  Filled 2020-10-07 (×4): qty 5000

## 2020-10-07 MED ORDER — PRISMASOL BGK 0/2.5 32-2.5 MEQ/L EC SOLN
Status: DC
  Filled 2020-10-07 (×18): qty 5000

## 2020-10-07 NOTE — Progress Notes (Signed)
NAMESweetie [name], MRN:  [id number], DOB:  Feb 18, 1951, LOS: 9 ADMISSION DATE:  08/31/2020, CONSULTATION DATE:  09/07/2020 REFERRING MD:  Dr Candace [name], CHIEF COMPLAINT:  Acute resp failure  Brief History   69 year old female who was previously diagnosed with Covid 08/23/2020.  Admitted 11/2 with AF-RVR, found to have a perforated duodenal underwent exploratory laparotomy with Phillip Heal patch placement.  Past Medical History  Covid pneumonia Atrial fibrillation CKD stage III Diabetes mellitus Hypertension Colon cancer Hyperlipidemia  Significant Hospital Events   11/2 Admitted  11/3 OR -> perforated duodenal ulcer 11/10 progressive hemorrhagic shock, intubated, transfused, pressors, proned; started on CRRT in PM 11/16 Extubated. Re-intubated overnight due to respiratory distress and hypoxia with decreased mentation 11/18  bronch'd/ cultures sent 11/19 hgb down getting blood 09/22/2020 spiked fever resume empirical antimicrobial therapy 11/26: hemorrhagic shock, hgb 5.6, increased pressors, CT A/P   Consults:  Cardiology [facility] Nephrology   Procedures:  11/3 Exploratory laparotomy, Phillip Heal patch, lysis of adhesion for duodenal ulceration postop day 6  R PICC 11/5 >> A line 11/9 >> out ETT 11/9 > 11/16, 11/16 >> 09/21/2020 09/21/2020 tracheostomy>> Lt [place] CVL 11/9 >> R IJ trialysis >> out HD catheter 12/1 >>   Significant Diagnostic Tests:  11/3 CT abd/ pelvis > 1. Positive for bowel perforation: Pneumoperitoneum and intermediate density free fluid in the abdomen. Prior total colectomy. The specific site of perforation is unclear-oral contrast present to the proximal jejunum has not obviously leaked. Note that there may be small bowel loops adherent to the ventral abdominal wall along the greater curve of the stomach. 2. Extensive bilateral lower lung pneumonia. No pleural effusion. 3. Other abdominal and pelvic viscera are stable since 2015, including bilateral adrenal adenomas.  Chronic renal parapelvic cysts. 4. Aortic Atherosclerosis 11/3 TTE > EF 70-75%, RV not well visualized, mildly reduced RV systolic function 11/94 CT chest/ abd/ pelvis> 1. Interval progression of diffuse bilateral hazy ground-glass airspace opacities with more focal areas of consolidation at the lung bases 2. Trace bilateral pleural effusions. 3. Postsurgical changes the abdomen as detailed above. No evidence for a postoperative abscess, however evaluation is limited by lack of IV contrast. 4. There is a 1.9 cm cystic appearing lesion located in the pancreatic body. This was not present on the patient's CT from 2015.  Follow-up with an outpatient contrast enhanced MRI is recommended. 5. The endometrial stripe appears diffusely thickened. Follow-up with pelvic ultrasound is recommended. Aortic Atherosclerosis 11/14 LE doppler studies > + DVT of right posterior tibial and peroneal vein, +dVT of left posterior tibial vein  11/26 CT ABD PEL > liver unremarkable, distended gallbladder with layering tiny gallstones versus sludge, no duct dilatation, mild hyperdensity right upper pole renal collecting system new.  No evidence of retroperitoneal bleeding.  Multifocal lower lobe predominant pulmonary infiltrates/pneumonia.  Small left pleural effusion.  Micro Data:  11/10 MRSA PCR > neg 11/10 BC x 2 > neg 09/22/2020 blood cultures x2>> S epi.  09/22/2020 sputum culture>> MRSA Blood 12/1 >> negative   Antimicrobials:  azithro 11/2 >11/3 Ceftriaxone 11/2  Fluconazole 11/3 Zosyn 11/3 >> 11/7 Vanc 11/10 off Cefepime 11/10 > 11/16 09/22/2020 vancomycin for MRSA PNA >> 12/2   Subjective:  Remains critically ill, no sig improvement.  Increasing pressor needs, tachy 140's. Remains off CRRT.   Objective   Blood pressure 93/63, pulse (!) 147, temperature 99 F (37.2 C), temperature source Axillary, resp. rate (!) 27, height 5\' 8"  (1.727 m), weight 86.8 kg, SpO2 94 %. CVP:  [  13 mmHg-29 mmHg] 23 mmHg   Vent Mode: PCV FiO2 (%):  [70 %-100 %] 70 % Set Rate:  [26 bmp] 26 bmp PEEP:  [12 cmH20] 12 cmH20 Plateau Pressure:  [25 cmH20-34 cmH20] 34 cmH20   Intake/Output Summary (Last 24 hours) at 10/07/2020 0957 Last data filed at 10/07/2020 9798 Gross per 24 hour  Intake 3826.43 ml  Output 75 ml  Net 3751.43 ml   Filed Weights   10/04/20 0412 10/05/20 0408 10/06/20 0500  Weight: 86.7 kg 84.2 kg 86.8 kg   Physical Exam: General: Critically ill elderly F, s/p trach, lying supine in bed HENT: Trach secure. Anicteric sclera. Pink mm. Trachea midline. Respiratory: resps even, slightly labored, tachypneic on vent, some vent dyssynchrony  Cardiovascular: Regular rate and rhythm, no murmur GI: Ileostomy. 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, status post tracheostomy Complicated by healthcare associated pneumonia due to MRSA PLAN -  Vent support - 8cc/kg  F/u CXR  F/u ABG S/p abx for HCAP Remains on PC, peep 12  Trend abg  F/u respiratory culture  Acute metabolic encephalopathy in the setting of renal failure, shock, sepsis PLAN -  Continue fentanyl, precedex gtt  Continue oxycodone 5 mg every 8 hour Continue Seroquel and Klonopin Delirium precautions  Multifactorial shock Previously hemorrhagic shock requiring PRBCs, Septic shock, MRSA pneumonia, treated for 8 days 1/4 bottles BCX with Meth R Staph Epi, likely contaminant Acute blood loss anemia PLAN -  Patient continued to require high-dose vasopressors, unable to wean off Continue levophed, titrate as able to keep MAP >60 Continue midodrine 20 mg 3 times daily Completed full course of vancomycin 12/2 for MRSA pneumonia.  Continue contact isolation  AKI Renal following  CRRT paused for now  - will continue to assess  Monitor UOP   Duodenal ulcer perforation status post ex lap and Graham's patch Appreciate CTS assistance and management Ultimately would need PEG for continued ongoing aggressive care, however we are unable to do  so currently because gastric insufflation would put her duodenal patch at risk.  CCS has discussed this with family   Paroxysmal A. fib, currently in sinus rhythm Patient is in sinus rhythm now Continue heparin infusion for stroke prophylaxis  Thrombocytopenia - Continue to monitor  Diabetes type 2 SSI, lantus   1.9 cm cystic appearing lesion located in the pancreatic body Plan -No intervention at this time, plan outpatient follow-up  History of stage IV colon cancer Status post pulmonary [facility]atectomies at [facility] + Chemo No intervention at this time  Indeterminate bilateral DVT On heparin infusion  Best practice:  Diet: Tube feeds Pain/Anxiety/Delirium protocol (if indicated) Precedex, fentanyl, oxycodone per tube, clonazepam VAP protocol (if indicated): yes DVT prophylaxis: IV heparin GI prophylaxis: Protonix Glucose control: lantus + ssi  Mobility: Bedrest Code Status: Full Goals of care discussion 12/9: Spoke with patient's daughter at bedside at length.  Discussed severity of illness and likely overall worsening despite max efforts.  She and her sister want to continue aggressive support.  RN and RT involved in discussion as well.   Next goals of care discussion 12/14 Disposition: ICU   Total critical care time: 36 minutes   Nickolas Madrid, NP Pulmonary/Critical Care Medicine  10/07/2020  9:57 AM

## 2020-10-07 NOTE — Progress Notes (Signed)
Spoke at length with Dr. Caryn Section in Medical ICU at [facility] at the request of family to consider tertiary tx.  Unfortunately he feels there is no additional medical therapy he has to offer that she is not currently receiving and additionally there are no available ICU beds at this time therefor consideration for transfer was denied by [facility]. Will make family aware.   Nickolas Madrid, NP Pulmonary/Critical Care Medicine  10/07/2020  3:44 PM

## 2020-10-07 NOTE — Progress Notes (Signed)
[facility] Progress Note     Assessment/ Plan:   1. AKI- oliguric presumably due to ischemic ATN in setting of hemorrhagic shock. Started on CRRT 09/08/20 due to persistent oliguria and hyperkalemia.Failed attempt at [facility] AM; CRRT 12/5-12/7. 1. All fluids had been 4K/2.5Ca: Pre-filter 400 ml/hr, post-filter 300 ml/hr, dialysate 1,500 ml/hr when she was on CRRT with very limited UF bec  of the soft pressures. 2. We held   CRRT   and will assess daily to see if UOP starts to pick up off CRRT. Basically anuric on CRRT which is not surprising. 3. UOP ~267m overnight per nursing staff; let's hold CRRT until there are absolute indications or if there are high catabolic state requiring aggressive nutritional support.   2. Hemorrhagic shock- ongoing blood loss and requiring pressors. 1. Transfuse as needed 3. Septic shock due to MRSA pneumonia. 4. Perforated duodenal ulcer- s/p exploratory lap and Graham patch placement per surgery. 5. Acute hypoxic/hypercapnic respiratory failure due to ARDS from Recent Covid-19 PNA- currently on vent via trach per PCCM.  6. Thrombocytopenia- follow 7. P. Atrial fibrillation- no anticoagulation due to ongoing GI bleed. 8. Vascular access- RIJ trialysis catheter 09/08/20 then removed. Left subclavian trialysis catheter placed 09/15/20 andremoved and new right [place] HD catheter placed on 09/29/20. 1. [facility]. 9. Severe protein malnutrition- per primary svc. 10. Disposition- poor overall prognosis and will likely require LTC and chronic vent support.    Subjective:   Remains on Levo (4); pressures are  soft. No events overnight.   Objective:   BP 93/63   Pulse (!) 147   Temp 99 F (37.2 C) (Axillary)   Resp (!) 27   Ht _0  (1.727 m)   Wt 86.8 kg   SpO2 94%   BMI 29.10 kg/m   Intake/Output Summary (Last 24 hours) at 10/07/2020 06389Last data filed at 10/07/2020 0815 Gross per 24 hour   Intake 3961.35 ml  Output 75 ml  Net 3886.35 ml   Weight change:   Physical Exam: Gen:on vent via trach, sedated CHTD:SKAJGat 140 Resp:ventilated BS bilaterally Abd:+BS, soft, NT/ND Ext:1+ edema of  lower extremities, 2+ upper ext Access: Rt scv temp   Imaging: No results found.  Labs: BMET Recent Labs  Lab 10/04/20 0516 10/04/20 1650 10/05/20 0505 10/05/20 1447 10/05/20 1545 10/06/20 0530 10/06/20 1119 10/06/20 1652 10/07/20 0432  NA 135 135 136 134* 138 138 137 137 137  K 4.7 4.9 4.6 5.8* 4.8 4.2 5.2* 4.5 4.6  CL 103 104 105  --  104 106  --  106 107  CO2 _1 --  24 23  --  22 20*  GLUCOSE 120* 163* 166*  --  121* 114*  --  101* 108*  BUN 25* 28* 29*  --  27* 27*  --  39* 59*  CREATININE 0.40* 0.47 0.48  --  0.41* 0.34*  --  0.52 0.74  CALCIUM 8.5* 7.6* 8.6*  --  7.9* 7.8*  --  7.8* 8.4*  PHOS 2.4* 3.0 3.4  --  4.5 3.4  --  3.7 5.0*   CBC Recent Labs  Lab 10/04/20 0516 10/05/20 0505 10/05/20 1447 10/06/20 0530 10/06/20 1119 10/06/20 1151 10/06/20 2157 10/07/20 0432  WBC 6.3 8.8  --  15.4*  --   --   --  11.7*  NEUTROABS 5.2  --   --   --   --   --   --   --  HGB 8.8* 7.7*   < > 6.6* 7.5* 5.8* 6.9* 9.3*  HCT 27.7* 24.2*   < > 21.6* 22.0* 19.9* 23.1* 29.8*  MCV 93.6 94.5  --  98.6  --   --   --  96.4  PLT 75* 60*  --  118*  --   --   --  192   < > = values in this interval not displayed.    Medications:    . sodium chloride   Intravenous Once  . sodium chloride   Intravenous Once  . sodium chloride   Intravenous Once  . B-complex with vitamin C  1 tablet Per Tube Daily  . chlorhexidine gluconate (MEDLINE KIT)  15 mL Mouth Rinse BID  . Chlorhexidine Gluconate Cloth  6 each Topical Daily  . clonazepam  1 mg Per Tube BID  . collagenase   Topical Daily  . darbepoetin (ARANESP) injection - DIALYSIS  60 mcg Subcutaneous Q Tue-1800  . feeding supplement (PROSource TF)  90 mL Per Tube TID  . insulin aspart  0-20 Units Subcutaneous Q4H  .  insulin aspart  8 Units Subcutaneous Q4H  . insulin glargine  25 Units Subcutaneous Daily  . mouth rinse  15 mL Mouth Rinse QID  . midodrine  20 mg Per Tube TID WC  . oxyCODONE  5 mg Per Tube Q8H  . pantoprazole sodium  40 mg Per Tube BID  . QUEtiapine  100 mg Per Tube BID  . sodium chloride flush  10-40 mL Intracatheter Q12H      Otelia Santee, MD 10/07/2020, 8:22 AM

## 2020-10-07 NOTE — Progress Notes (Signed)
Nutrition Follow-up  DOCUMENTATION CODES:   Obesity unspecified  INTERVENTION:   Tube Feeding via Cortrak: Vital 1.5 at 60 ml/hr Pro-source TF 90 mL TID Provides 163 g of protein, 2400 kcals and 1094 mL of free water  Continue B-complex with C  Copper deficiency; plan to supplement   NUTRITION DIAGNOSIS:   Inadequate oral intake related to inability to eat as evidenced by NPO status.  Being addressed via TF   GOAL:   Patient will meet greater than or equal to 90% of their needs  Progressing  MONITOR:   Vent status,TF tolerance,Labs,Weight trends  REASON FOR ASSESSMENT:   Consult New TPN/TNA  ASSESSMENT:   69 y.o. Candace [name] was recently hospitalized for COVID-19 pneumonia-and discharged on 2-3 L of home O2-presents with worsening shortness of breath-found to have A. fib with RVR, worsening hypoxia. Pt developed severe upper abdominal pain-subsequent further imaging studies revealed perforated viscus. S/p laparotomy on 11/3 which showed a perforated duodenal ulcer.   11/03Exploratory laparotomy, adhesiolysis, modified graham patch repair of perforated duodenal ulcer 11/05 TPN initiated 11/08 UGI negative for leak 11/09 Acute decline in respiratory and mental status requiring intubation 11/10 Started on trickle TF, CT abdomen negative for postop complication 35/57 TPN discontinued due to elevated LFTS, TGs; TF increased to 30 ml/hr, Started CRRT 11/16 Extubated, Re-intubated 11/23 Trach placed, CRRT d/c 11/24 CRRT restarted due to pressors, hyperkalemia 11/26 Hemorrhagic shock, Hgb 5.6 12/04 CRRT stopped 12/05 CRRT restarted after failed iHD 12/07 CRRT held  Pt remains on vent support via trach, remains on vasopressors (levophed), sedated with fentanyl and precedex   Off CRRT currently, MD assessing daily the need for RRT  Tolerating Vital 1.5 at 60 ml/hr, Pro-Source 90 mL TID via Cortrak  Ultimately needs permanent feeding ccess PEG for continued ongoing  aggressive care, however we are unable to place G-tube currently because gastric insufflation would put her duodenal patch at risk.  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  Estimated Nutritional Needs:   Kcal:  3220-2542 kcals  Protein:  135-175 g  Fluid:  >/= 2 L/day  Kerman Passey MS, RDN, LDN, CNSC Registered Dietitian III Clinical Nutrition RD Pager and On-Call Pager Number Located in [place]

## 2020-10-07 NOTE — Progress Notes (Signed)
PT Cancellation Note  Patient Details Name: Candace [name] MRN: [id number] DOB: 13-Feb-1951   Cancelled Treatment:    Reason Eval/Treat Not Completed: Medical issues which prohibited therapy.  Pt continues to need support of multiple pressors and vent settings are outside (too high-FiO2 70% PEEP 12) of PT parameters for treatment.  Pt is not stable enough to participate in therapy today.  We will continue to follow for stability.     Thanks,  Verdene Lennert, PT, DPT  Acute Rehabilitation [phone] pager #[phone] office      [name] 10/07/2020, 11:21 AM

## 2020-10-07 NOTE — Progress Notes (Signed)
Received call from PCCM PM labs returned- K up to 6.6, Cr 1.07. 65 mL UOP charted for today.  Will restart CRRT 0K pre/ post and 2K dialysate.   No heparin intra-circuit.  Madelon Lips MD [facility] pgr [phone]

## 2020-10-07 NOTE — Progress Notes (Signed)
ANTICOAGULATION CONSULT NOTE  Pharmacy Consult for heparin Indication: atrial fibrillation  No Known Allergies  Patient Measurements: Height: 5\' 8"  (172.7 cm) Weight: 86.8 kg (191 lb 5.8 oz) IBW/kg (Calculated) : 63.9 Heparin Dosing Weight: 81kg  Vital Signs: Temp: 99.4 F (37.4 C) (12/09 1730) Temp Source: Axillary (12/09 1730) BP: 108/70 (12/09 1745) Pulse Rate: 103 (12/09 1745)  Labs: Recent Labs    10/05/20 0505 10/05/20 1447 10/06/20 0530 10/06/20 1119 10/06/20 1151 10/06/20 1652 10/06/20 2157 10/07/20 0432 10/07/20 0622 10/07/20 1630  HGB 7.7*   < > 6.6*   < > 5.8*  --  6.9* 9.3*  --   --   HCT 24.2*   < > 21.6*   < > 19.9*  --  23.1* 29.8*  --   --   PLT 60*  --  118*  --   --   --   --  192  --   --   HEPARINUNFRC  --    < >  --   --   --   --   --  >2.20* 0.22* 0.11*  CREATININE 0.48   < > 0.34*  --   --  0.52  --  0.74  --   --    < > = values in this interval not displayed.    Estimated Creatinine Clearance: 76.6 mL/min (by C-G formula based on SCr of 0.74 mg/dL).  Assessment: 69 y.o. female with h/o Afib, Eliquis on hold, for heparin drip  Heparin drip 1150 uts/hr  HL 0.11 < goal and lower than earlier today. Per RN no problems with IV line - heparin infusing through PICC line or heparin infusion - running fine and not turned off.  Patient is edematous and lab could not get blood.  RN paused heparin drip for about 68min, flushed and then drew heparin level. Could be reason for low level.  Goal of Therapy:  Heparin level 0.3-0.5 units/ml Monitor platelets by anticoagulation protocol: Yes   Plan:  Increase heparin drip to 1200 uts/hr Check daily HL, CBC and infusion rate    Bonnita Nasuti Pharm.D. CPP, BCPS Clinical Pharmacist [phone] 10/07/2020 6:14 PM

## 2020-10-08 ENCOUNTER — Inpatient Hospital Stay (HOSPITAL_COMMUNITY): Payer: Medicare Other

## 2020-10-08 LAB — POCT I-STAT 7, (LYTES, BLD GAS, ICA,H+H)
Acid-Base Excess: 1 mmol/L (ref 0.0–2.0)
Bicarbonate: 27.3 mmol/L (ref 20.0–28.0)
Calcium, Ion: 1.37 mmol/L (ref 1.15–1.40)
HCT: 27 % — ABNORMAL LOW (ref 36.0–46.0)
Hemoglobin: 9.2 g/dL — ABNORMAL LOW (ref 12.0–15.0)
O2 Saturation: 100 %
Potassium: 3.6 mmol/L (ref 3.5–5.1)
Sodium: 135 mmol/L (ref 135–145)
TCO2: 29 mmol/L (ref 22–32)
pCO2 arterial: 49.4 mmHg — ABNORMAL HIGH (ref 32.0–48.0)
pH, Arterial: 7.35 (ref 7.350–7.450)
pO2, Arterial: 183 mmHg — ABNORMAL HIGH (ref 83.0–108.0)

## 2020-10-08 LAB — GLUCOSE, CAPILLARY
Glucose-Capillary: 195 mg/dL — ABNORMAL HIGH (ref 70–99)
Glucose-Capillary: 173 mg/dL — ABNORMAL HIGH (ref 70–99)
Glucose-Capillary: 163 mg/dL — ABNORMAL HIGH (ref 70–99)
Glucose-Capillary: 104 mg/dL — ABNORMAL HIGH (ref 70–99)
Glucose-Capillary: 133 mg/dL — ABNORMAL HIGH (ref 70–99)
Glucose-Capillary: 129 mg/dL — ABNORMAL HIGH (ref 70–99)

## 2020-10-08 LAB — RENAL FUNCTION PANEL
Albumin: 1.3 g/dL — ABNORMAL LOW (ref 3.5–5.0)
Albumin: 1.3 g/dL — ABNORMAL LOW (ref 3.5–5.0)
Anion gap: 9 (ref 5–15)
Anion gap: 10 (ref 5–15)
BUN: 59 mg/dL — ABNORMAL HIGH (ref 8–23)
BUN: 45 mg/dL — ABNORMAL HIGH (ref 8–23)
CO2: 22 mmol/L (ref 22–32)
CO2: 22 mmol/L (ref 22–32)
Calcium: 8.3 mg/dL — ABNORMAL LOW (ref 8.9–10.3)
Calcium: 8.4 mg/dL — ABNORMAL LOW (ref 8.9–10.3)
Chloride: 102 mmol/L (ref 98–111)
Chloride: 103 mmol/L (ref 98–111)
Creatinine, Ser: 0.8 mg/dL (ref 0.44–1.00)
Creatinine, Ser: 0.65 mg/dL (ref 0.44–1.00)
GFR, Estimated: >60 mL/min (ref >60)
GFR, Estimated: >60 mL/min (ref >60)
Glucose, Bld: 227 mg/dL — ABNORMAL HIGH (ref 70–99)
Glucose, Bld: 107 mg/dL — ABNORMAL HIGH (ref 70–99)
Phosphorus: 5 mg/dL — ABNORMAL HIGH (ref 2.5–4.6)
Phosphorus: 4.7 mg/dL — ABNORMAL HIGH (ref 2.5–4.6)
Potassium: 4.3 mmol/L (ref 3.5–5.1)
Potassium: 3.5 mmol/L (ref 3.5–5.1)
Sodium: 133 mmol/L — ABNORMAL LOW (ref 135–145)
Sodium: 135 mmol/L (ref 135–145)

## 2020-10-08 LAB — CBC
HCT: 26.9 % — ABNORMAL LOW (ref 36.0–46.0)
Hemoglobin: 8.5 g/dL — ABNORMAL LOW (ref 12.0–15.0)
MCH: 30.2 pg (ref 26.0–34.0)
MCHC: 31.6 g/dL (ref 30.0–36.0)
MCV: 95.7 fL (ref 80.0–100.0)
Platelets: 149 10*3/uL — ABNORMAL LOW (ref 150–400)
RBC: 2.81 MIL/uL — ABNORMAL LOW (ref 3.87–5.11)
RDW: 16.6 % — ABNORMAL HIGH (ref 11.5–15.5)
WBC: 11.7 10*3/uL — ABNORMAL HIGH (ref 4.0–10.5)
nRBC: 0.3 % — ABNORMAL HIGH (ref 0.0–0.2)

## 2020-10-08 LAB — TYPE AND SCREEN
ABO/RH(D): B POS
Antibody Screen: NEGATIVE
Unit division: 0
Unit division: 0
Unit division: 0

## 2020-10-08 LAB — BPAM RBC
Blood Product Expiration Date: 202112302359
Blood Product Expiration Date: 202201042359
Blood Product Expiration Date: 202201042359
ISSUE DATE / TIME: 202112060153
ISSUE DATE / TIME: 202112081644
ISSUE DATE / TIME: 202112090026
Unit Type and Rh: 7300
Unit Type and Rh: 7300
Unit Type and Rh: 7300

## 2020-10-08 LAB — HEPARIN LEVEL (UNFRACTIONATED): Heparin Unfractionated: 0.53 IU/mL (ref 0.30–0.70)

## 2020-10-08 LAB — MAGNESIUM: Magnesium: 2.1 mg/dL (ref 1.7–2.4)

## 2020-10-08 MED ORDER — ROCURONIUM BROMIDE 10 MG/ML (PF) SYRINGE
PREFILLED_SYRINGE | INTRAVENOUS | Status: AC
  Filled 2020-10-08: qty 10

## 2020-10-08 MED ORDER — MIDAZOLAM HCL 2 MG/2ML IJ SOLN
INTRAMUSCULAR | Status: AC
  Administered 2020-10-08: 2 mg
  Filled 2020-10-08: qty 2

## 2020-10-08 MED ORDER — SODIUM BICARBONATE 8.4 % IV SOLN: 50.0000 meq | Freq: Once | INTRAVENOUS | Status: AC

## 2020-10-08 MED ORDER — MIDAZOLAM HCL 2 MG/2ML IJ SOLN: 2.0000 mg | Freq: Once | INTRAMUSCULAR | Status: AC

## 2020-10-08 MED ORDER — SODIUM CHLORIDE 0.9 % IV SOLN
0.5000 mg/h | INTRAVENOUS | Status: DC
Start: 2020-10-08 — End: 2020-10-08

## 2020-10-08 MED ORDER — PRISMASOL BGK 0/2.5 32-2.5 MEQ/L EC SOLN
Status: DC
  Filled 2020-10-08 (×3): qty 5000

## 2020-10-08 MED ORDER — PRISMASOL BGK 0/2.5 32-2.5 MEQ/L EC SOLN
Status: DC
  Filled 2020-10-08 (×2): qty 5000

## 2020-10-08 MED ORDER — MIDAZOLAM HCL 2 MG/2ML IJ SOLN: 1.0000 mg | Freq: Once | INTRAMUSCULAR | Status: DC

## 2020-10-08 MED ORDER — MIDAZOLAM HCL 2 MG/2ML IJ SOLN
INTRAMUSCULAR | Status: AC
  Filled 2020-10-08: qty 2

## 2020-10-08 MED ORDER — HYDROMORPHONE BOLUS VIA INFUSION
1.0000 mg | INTRAVENOUS | Status: DC | PRN
  Administered 2020-10-08 – 2020-10-12 (×6): 1 mg via INTRAVENOUS
  Filled 2020-10-08: qty 1

## 2020-10-08 MED ORDER — ROCURONIUM BROMIDE 50 MG/5ML IV SOLN
100.0000 mg | Freq: Once | INTRAVENOUS | Status: DC
  Filled 2020-10-08: qty 10

## 2020-10-08 MED ORDER — SODIUM CHLORIDE 0.9 % IV SOLN
0.5000 mg/h | INTRAVENOUS | Status: DC
  Administered 2020-10-08 – 2020-10-13 (×3): 0.5 mg/h via INTRAVENOUS
  Filled 2020-10-08 (×3): qty 5

## 2020-10-08 MED ORDER — SODIUM BICARBONATE 8.4 % IV SOLN
INTRAVENOUS | Status: AC
  Administered 2020-10-08: 50 meq via INTRAVENOUS
  Filled 2020-10-08: qty 50

## 2020-10-08 NOTE — Progress Notes (Signed)
ANTICOAGULATION CONSULT NOTE  Pharmacy Consult for heparin Indication: atrial fibrillation  No Known Allergies  Patient Measurements: Height: 5\' 8"  (172.7 cm) Weight: 91.3 kg (201 lb 4.5 oz) IBW/kg (Calculated) : 63.9 Heparin Dosing Weight: 81kg  Vital Signs: Temp: 99.1 F (37.3 C) (12/10 0331) Temp Source: Axillary (12/10 0331) BP: 135/75 (12/10 0720) Pulse Rate: 69 (12/10 0720)  Labs: Recent Labs    10/06/20 0530 10/06/20 1119 10/06/20 2157 10/07/20 0432 10/07/20 0622 10/07/20 1600 10/07/20 1630 10/07/20 2000 10/08/20 0321 10/08/20 0322  HGB 6.6*   < > 6.9* 9.3*  --   --   --   --   --  8.5*  HCT 21.6*   < > 23.1* 29.8*  --   --   --   --   --  26.9*  PLT 118*  --   --  192  --   --   --   --   --  149*  HEPARINUNFRC  --   --   --  >2.20* 0.22*  --  0.11*  --  0.53  --   CREATININE 0.34*   < >  --  0.74  --  1.07*  --  1.00 0.80  --    < > = values in this interval not displayed.    Estimated Creatinine Clearance: 78.5 mL/min (by C-G formula based on SCr of 0.8 mg/dL).  Assessment: 69 y.o. female with h/o Afib, Eliquis on hold, for heparin drip   Hep lvl slightly above goal  Cbc  Returned to crrt last night  Goal of Therapy:  Heparin level 0.3-0.5 units/ml Monitor platelets by anticoagulation protocol: Yes   Plan:  Reduce heparin drip to 1150 uts/hr Daily hep lvl cbc  Barth Kirks, PharmD, BCPS, BCCCP Clinical Pharmacist [phone]  Please check AMION for all [place] numbers  10/08/2020 8:15 AM '

## 2020-10-08 NOTE — Progress Notes (Signed)
Pt w/ s/s of decompensation this afternoon, tachypneic, tachycardic. Attempted to titrate sedation in balance w/ pressor w/o resolution of s/s. MD notified and at bed. Interventions per orders. Daughter updated by MD and RN, emotional support provided.

## 2020-10-08 NOTE — Progress Notes (Addendum)
PCCM Progress Note  Patient with low flow on CRRT. Spoke with Nephrology who reviewed labs and recommended discontinuing CRRT if/when device clots, with plan to restart again when electrolytes are abnormal.  While in the room, patient had tachycardia, tachypnea and worsening hypoxemia. Adjusted vent settings from PC to PRVC FIO2 100% PEEP 12 RR 28 TV 450 cc (7cc/kg). Gave roc 100 mg.  Updated daughter at bedside. We had spoken this morning and confirmed that patient remains full code and wishes to continue aggressive measures.  Plan ABG  CXR Will update Elink and night ground team regarding patient's tenuous status.  The patient is critically ill with multiple organ systems failure and requires high complexity decision making for assessment and support, frequent evaluation and titration of therapies, application of advanced monitoring technologies and extensive interpretation of multiple databases.  Additional Independent Critical Care Time: 90 Minutes.   Rodman Pickle, M.D. [facility] Pulmonary/Critical Care Medicine 10/08/2020 6:17 PM   Please see Amion for pager number to reach on-call Pulmonary and Critical Care Team.

## 2020-10-08 NOTE — Progress Notes (Signed)
OT Cancellation Note  Patient Details Name: Candace [name] MRN: [id number] DOB: [birth year]   Cancelled Treatment:    Reason Eval/Treat Not Completed: Patient not medically ready.   Ramond Dial, OT/L   Acute OT Clinical Specialist Acute Rehabilitation Services Pager [phone] Office [phone]  10/08/2020, 9:05 AM

## 2020-10-08 NOTE — Plan of Care (Signed)
  Problem: Nutrition: Goal: Adequate nutrition will be maintained Outcome: Progressing   Problem: Elimination: Goal: Will not experience complications related to bowel motility Outcome: Progressing   Problem: Activity: Goal: Interest or engagement in leisure activities will improve Outcome: Not Progressing Note: Pt is unresponsive. Unable to mobilize   Problem: Clinical Measurements: Goal: Respiratory complications will improve Outcome: Not Progressing Note: Pt remains on high ventilator settings and unable to wean.    Problem: Activity: Goal: Risk for activity intolerance will decrease Outcome: Not Progressing   Problem: Activity: Goal: Ability to tolerate increased activity will improve Outcome: Not Progressing

## 2020-10-08 NOTE — Progress Notes (Signed)
NAMETherma [name], MRN:  [id number], DOB:  31-Dec-1950, LOS: 14 ADMISSION DATE:  08/31/2020, CONSULTATION DATE:  09/07/2020 REFERRING MD:  Dr Candace [name], CHIEF COMPLAINT:  Acute resp failure  Brief History   69 year old female who was previously diagnosed with Covid 08/23/2020.  Admitted 11/2 with AF-RVR, found to have a perforated duodenal underwent exploratory laparotomy with Phillip Heal patch placement.  Past Medical History  Covid pneumonia Atrial fibrillation CKD stage III Diabetes mellitus Hypertension Colon cancer Hyperlipidemia  Significant Hospital Events   11/2 Admitted  11/3 OR -> perforated duodenal ulcer 11/10 progressive hemorrhagic shock, intubated, transfused, pressors, proned; started on CRRT in PM 11/16 Extubated. Re-intubated overnight due to respiratory distress and hypoxia with decreased mentation 11/18  bronch'd/ cultures sent 11/19 hgb down getting blood 09/22/2020 spiked fever resume empirical antimicrobial therapy 11/26: hemorrhagic shock, hgb 5.6, increased pressors, CT A/P  12/8: CRRT discontinued due to clotting 12/10: CRRT restarted  Consults:  Cardiology CCS Nephrology   Procedures:  11/3 Exploratory laparotomy, Phillip Heal patch, lysis of adhesion for duodenal ulceration postop day 6  R PICC 11/5 >> A line 11/9 >> out ETT 11/9 > 11/16, 11/16 >> 09/21/2020 09/21/2020 tracheostomy>> Lt [place] CVL 11/9 >> R IJ trialysis >> out HD catheter 12/1 >>   Significant Diagnostic Tests:  11/3 CT abd/ pelvis > 1. Positive for bowel perforation: Pneumoperitoneum and intermediate density free fluid in the abdomen. Prior total colectomy. The specific site of perforation is unclear-oral contrast present to the proximal jejunum has not obviously leaked. Note that there may be small bowel loops adherent to the ventral abdominal wall along the greater curve of the stomach. 2. Extensive bilateral lower lung pneumonia. No pleural effusion. 3. Other abdominal and pelvic  viscera are stable since 2015, including bilateral adrenal adenomas. Chronic renal parapelvic cysts. 4. Aortic Atherosclerosis 11/3 TTE > EF 70-75%, RV not well visualized, mildly reduced RV systolic function 26/83 CT chest/ abd/ pelvis> 1. Interval progression of diffuse bilateral hazy ground-glass airspace opacities with more focal areas of consolidation at the lung bases 2. Trace bilateral pleural effusions. 3. Postsurgical changes the abdomen as detailed above. No evidence for a postoperative abscess, however evaluation is limited by lack of IV contrast. 4. There is a 1.9 cm cystic appearing lesion located in the pancreatic body. This was not present on the patient's CT from 2015.  Follow-up with an outpatient contrast enhanced MRI is recommended. 5. The endometrial stripe appears diffusely thickened. Follow-up with pelvic ultrasound is recommended. Aortic Atherosclerosis 11/14 LE doppler studies > + DVT of right posterior tibial and peroneal vein, +dVT of left posterior tibial vein  11/26 CT ABD PEL > liver unremarkable, distended gallbladder with layering tiny gallstones versus sludge, no duct dilatation, mild hyperdensity right upper pole renal collecting system new.  No evidence of retroperitoneal bleeding.  Multifocal lower lobe predominant pulmonary infiltrates/pneumonia.  Small left pleural effusion.  Micro Data:  11/10 MRSA PCR > neg 11/10 BC x 2 > neg 09/22/2020 blood cultures x2>> S epi.  09/22/2020 sputum culture>> MRSA Blood 12/1 >> negative. S.epi 1 out of 2, likely contaminant  Antimicrobials:  azithro 11/2 >11/3 Ceftriaxone 11/2  Fluconazole 11/3 Zosyn 11/3 >> 11/7 Vanc 11/10 off Cefepime 11/10 > 11/16 09/22/2020 vancomycin for MRSA PNA >> 12/2  Subjective:  Restarted CRRT overnight for hyperkalemia Remains on levophed Increased sedation overnight Primary team contacted [facility] yesterday for transfer per family request. Per [facility] physician team, no additional medical  therapy could be offered and no beds  were available.  Objective   Blood pressure 135/75, pulse 69, temperature 99.1 F (37.3 C), temperature source Axillary, resp. rate (!) 26, height 5\' 8"  (1.727 m), weight 91.3 kg, SpO2 100 %. CVP:  [10 mmHg-25 mmHg] 10 mmHg  Vent Mode: PCV FiO2 (%):  [60 %-70 %] 60 % Set Rate:  [26 bmp] 26 bmp PEEP:  [12 cmH20] 12 cmH20 Plateau Pressure:  [27 cmH20-33 cmH20] 31 cmH20   Intake/Output Summary (Last 24 hours) at 10/08/2020 0813 Last data filed at 10/08/2020 0800 Gross per 24 hour  Intake 3271.05 ml  Output 1191 ml  Net 2080.05 ml   Filed Weights   10/05/20 0408 10/06/20 0500 10/08/20 0338  Weight: 84.2 kg 86.8 kg 91.3 kg   Physical Exam: General: Chronically ill-appearing, sedated HENT: [place], AT, OP clear, MMM Neck: Trach in place, c/d/i Respiratory: Clear to auscultation bilaterally.  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, status post tracheostomy Complicated by healthcare associated pneumonia due to MRSA PLAN -  --Full vent support --Hold SBT/WUA in setting of critical illness --Attempt volume removal on pressor support  Acute metabolic encephalopathy in the setting of renal failure, shock, sepsis PLAN -  --Continue fentanyl, precedex gtt  --Continue oxycodone 5 mg every 8 hour --Continue Seroquel and Klonopin --Delirium precautions  Multifactorial shock Previously hemorrhagic shock requiring PRBCs, Septic shock, MRSA pneumonia s/p 8 days abx 1/4 bottles BCX with Meth R Staph Epi, likely contaminant Acute blood loss anemia PLAN -  --Patient continued to require high-dose vasopressors, unable to wean off --Continue levophed, titrate as able to keep MAP >60 --Continue midodrine 20 mg 3 times  daily --Completed full course of vancomycin 12/2 for MRSA pneumonia.  --Continue contact isolation  AKI, oliguric/anuric Hyperkalemia - improving --Nephrology consulted. CRRT resumed on 12/10 --Monitor UOP/Cr  Duodenal ulcer perforation status post ex lap and Graham's patch --Appreciate CTS assistance and management Ultimately would need PEG for continued ongoing aggressive care, however we are unable to do  so currently because gastric insufflation would put her duodenal patch at risk.  CCS has discussed this with family   Paroxysmal A. fib, currently in sinus rhythm --Patient is in sinus rhythm now --Continue heparin infusion for stroke prophylaxis  Diabetes type 2 --SSI, lantus   1.9 cm cystic appearing lesion located in the pancreatic body Plan --No intervention at this time, plan outpatient follow-up  History of stage IV colon cancer Status post pulmonary metastatectomies at [facility] + Chemo --No intervention at this time  Indeterminate bilateral DVT --On heparin infusion  Best practice:  Diet: Tube feeds Pain/Anxiety/Delirium protocol (if indicated) Precedex, fentanyl, oxycodone per tube, clonazepam VAP protocol (if indicated): yes DVT prophylaxis: IV heparin GI prophylaxis: Protonix Glucose control: lantus + ssi  Mobility: Bedrest Code Status: Full Goals of care discussion   12/9: Spoke with patient's daughter at bedside at length.  Discussed severity of illness and likely overall worsening despite max efforts.  She and her sister want to continue aggressive support.  RN and RT involved in discussion as well.   12/10: Updated daughter on current plan as above.   Next multidisciplinary goals of care discussion 12/14 Disposition: ICU  The patient is critically ill with multiple organ systems failure and requires high complexity decision making for assessment and support, frequent evaluation and titration of therapies, application  of advanced monitoring technologies and  extensive interpretation of multiple databases.  Independent Critical Care Time: 40 Minutes.   Rodman Pickle, M.D. [facility] Pulmonary/Critical Care Medicine 10/08/2020 8:13 AM   Please see Amion for pager number to reach on-call Pulmonary and Critical Care Team.

## 2020-10-08 NOTE — Progress Notes (Signed)
Mechanical issue with CRRT machines have necessitated that the therapy was interrupted for most of the afternoon between 1130 and 1600.  Running currently. Labs may not reflect the full benefit of treatment.

## 2020-10-08 NOTE — Progress Notes (Signed)
PT Cancellation Note  Patient Details Name: Candace [name] MRN: [id number] DOB: Apr 22, 1951   Cancelled Treatment:    Reason Eval/Treat Not Completed: Patient not medically ready Will f/u as able. Abran Richard, PT Acute Rehab Services Pager [phone] [facility] [phone]    Karlton Lemon 10/08/2020, 12:31 PM

## 2020-10-08 NOTE — Progress Notes (Signed)
[facility] Progress Note     Assessment/ Plan:   1. AKI- oliguric presumably due to ischemic ATN in setting of hemorrhagic shock. Started on CRRT 09/08/20 due to persistent oliguria and hyperkalemia.Failed attempt at [facility] AM; CRRT 12/5-12/7. Restarted 12/9 1. Continue with CRRT. Bags changed from 0 today to all 2K/2.5 calcium bags. 2. Net UF 0 to negative 50 cc/h as tolerated 3. At this junction, there does not seem to be any meaningful renal recovery   1. Hemorrhagic shock- ongoing blood loss and requiring pressors. 1. Transfuse as needed 2. Septic shock due to MRSA pneumonia. Completed vancomycin on 12/2 3. Perforated duodenal ulcer- s/p exploratory lap and Graham patch placement per surgery. 4. Acute hypoxic/hypercapnic respiratory failure due to ARDS from Recent Covid-19 PNA- currently on vent via trach per PCCM.  5. Thrombocytopenia- follow 6. P. Atrial fibrillation- no anticoagulation due to ongoing GI bleed. 7. Vascular access- RIJ trialysis catheter 09/08/20 then removed. Left subclavian trialysis catheter placed 09/15/20 andremoved and new right [place] HD catheter placed on 09/29/20. 1. Holding off on [facility] due to ongoing infection. 8. Severe protein malnutrition- per primary svc. 9. Disposition- poor overall prognosis and will likely require LTC and chronic vent support.    Subjective:   Restarted on CRRT overnight for potassium of 6.7. Increasing Levophed requirements. Remains oliguric. Discussed with daughter at the bedside   Objective:   BP (!) 89/66   Pulse 71   Temp 99.1 F (37.3 C) (Axillary)   Resp (!) 26   Ht $R'5\' 8"'GU$  (1.727 m)   Wt 91.3 kg   SpO2 100%   BMI 30.60 kg/m   Intake/Output Summary (Last 24 hours) at 10/08/2020 1252 Last data filed at 10/08/2020 1100 Gross per 24 hour  Intake 3212.25 ml  Output 1373 ml  Net 1839.25 ml   Weight change:   Physical Exam: Gen:on vent via trach, sedated KGM:WNUUV at 140 Resp:ventilated BS  bilaterally Abd:+BS, soft, NT/ND Ext:1+ edema of  lower extremities, 2+ upper ext Access: Rt scv temp   Imaging: No results found.  Labs: BMET Recent Labs  Lab 10/05/20 0505 10/05/20 1447 10/05/20 1545 10/06/20 0530 10/06/20 1119 10/06/20 1652 10/07/20 0432 10/07/20 1600 10/07/20 2000 10/08/20 0321  NA 136   < > 138 138 137 137 137 136 136 133*  K 4.6   < > 4.8 4.2 5.2* 4.5 4.6 6.6* 5.9* 4.3  CL 105  --  104 106  --  106 107 103 107 102  CO2 23  --  24 23  --  22 20* 22 19* 22  GLUCOSE 166*  --  121* 114*  --  101* 108* 153* 249* 227*  BUN 29*  --  27* 27*  --  39* 59* 73* 78* 59*  CREATININE 0.48  --  0.41* 0.34*  --  0.52 0.74 1.07* 1.00 0.80  CALCIUM 8.6*  --  7.9* 7.8*  --  7.8* 8.4* 9.3 8.3* 8.3*  PHOS 3.4  --  4.5 3.4  --  3.7 5.0* 6.8*  --  5.0*   < > = values in this interval not displayed.   CBC Recent Labs  Lab 10/04/20 0516 10/05/20 0505 10/05/20 1447 10/06/20 0530 10/06/20 1119 10/06/20 1151 10/06/20 2157 10/07/20 0432 10/08/20 0322  WBC 6.3 8.8  --  15.4*  --   --   --  11.7* 11.7*  NEUTROABS 5.2  --   --   --   --   --   --   --   --  HGB 8.8* 7.7*   < > 6.6*   < > 5.8* 6.9* 9.3* 8.5*  HCT 27.7* 24.2*   < > 21.6*   < > 19.9* 23.1* 29.8* 26.9*  MCV 93.6 94.5  --  98.6  --   --   --  96.4 95.7  PLT 75* 60*  --  118*  --   --   --  192 149*   < > = values in this interval not displayed.    Medications:    . sodium chloride   Intravenous Once  . sodium chloride   Intravenous Once  . sodium chloride   Intravenous Once  . B-complex with vitamin C  1 tablet Per Tube Daily  . chlorhexidine gluconate (MEDLINE KIT)  15 mL Mouth Rinse BID  . Chlorhexidine Gluconate Cloth  6 each Topical Daily  . clonazepam  1 mg Per Tube BID  . collagenase   Topical Daily  . darbepoetin (ARANESP) injection - DIALYSIS  60 mcg Subcutaneous Q Tue-1800  . feeding supplement (PROSource TF)  90 mL Per Tube TID  . insulin aspart  0-20 Units Subcutaneous Q4H  .  insulin aspart  8 Units Subcutaneous Q4H  . insulin glargine  25 Units Subcutaneous Daily  . mouth rinse  15 mL Mouth Rinse QID  . midodrine  20 mg Per Tube TID WC  . oxyCODONE  5 mg Per Tube Q8H  . pantoprazole sodium  40 mg Per Tube BID  . QUEtiapine  100 mg Per Tube BID  . sodium chloride flush  10-40 mL Intracatheter Q12H

## 2020-10-09 LAB — HEPARIN LEVEL (UNFRACTIONATED)
Heparin Unfractionated: 0.13 IU/mL — ABNORMAL LOW (ref 0.30–0.70)
Heparin Unfractionated: 0.21 IU/mL — ABNORMAL LOW (ref 0.30–0.70)
Heparin Unfractionated: 0.34 IU/mL (ref 0.30–0.70)

## 2020-10-09 LAB — RENAL FUNCTION PANEL
Albumin: 1.3 g/dL — ABNORMAL LOW (ref 3.5–5.0)
Albumin: 1.3 g/dL — ABNORMAL LOW (ref 3.5–5.0)
Anion gap: 9 (ref 5–15)
Anion gap: 8 (ref 5–15)
BUN: 36 mg/dL — ABNORMAL HIGH (ref 8–23)
BUN: 28 mg/dL — ABNORMAL HIGH (ref 8–23)
CO2: 24 mmol/L (ref 22–32)
CO2: 25 mmol/L (ref 22–32)
Calcium: 8.6 mg/dL — ABNORMAL LOW (ref 8.9–10.3)
Calcium: 9 mg/dL (ref 8.9–10.3)
Chloride: 101 mmol/L (ref 98–111)
Chloride: 102 mmol/L (ref 98–111)
Creatinine, Ser: 0.55 mg/dL (ref 0.44–1.00)
Creatinine, Ser: 0.5 mg/dL (ref 0.44–1.00)
GFR, Estimated: >60 mL/min (ref >60)
GFR, Estimated: >60 mL/min (ref >60)
Glucose, Bld: 190 mg/dL — ABNORMAL HIGH (ref 70–99)
Glucose, Bld: 151 mg/dL — ABNORMAL HIGH (ref 70–99)
Phosphorus: 3.2 mg/dL (ref 2.5–4.6)
Phosphorus: 2.9 mg/dL (ref 2.5–4.6)
Potassium: 3.5 mmol/L (ref 3.5–5.1)
Potassium: 3.5 mmol/L (ref 3.5–5.1)
Sodium: 134 mmol/L — ABNORMAL LOW (ref 135–145)
Sodium: 135 mmol/L (ref 135–145)

## 2020-10-09 LAB — CBC
HCT: 28.6 % — ABNORMAL LOW (ref 36.0–46.0)
Hemoglobin: 8.7 g/dL — ABNORMAL LOW (ref 12.0–15.0)
MCH: 29.6 pg (ref 26.0–34.0)
MCHC: 30.4 g/dL (ref 30.0–36.0)
MCV: 97.3 fL (ref 80.0–100.0)
Platelets: 153 10*3/uL (ref 150–400)
RBC: 2.94 MIL/uL — ABNORMAL LOW (ref 3.87–5.11)
RDW: 16.5 % — ABNORMAL HIGH (ref 11.5–15.5)
WBC: 10.8 10*3/uL — ABNORMAL HIGH (ref 4.0–10.5)
nRBC: 0.7 % — ABNORMAL HIGH (ref 0.0–0.2)

## 2020-10-09 LAB — GLUCOSE, CAPILLARY
Glucose-Capillary: 128 mg/dL — ABNORMAL HIGH (ref 70–99)
Glucose-Capillary: 135 mg/dL — ABNORMAL HIGH (ref 70–99)
Glucose-Capillary: 139 mg/dL — ABNORMAL HIGH (ref 70–99)
Glucose-Capillary: 143 mg/dL — ABNORMAL HIGH (ref 70–99)
Glucose-Capillary: 153 mg/dL — ABNORMAL HIGH (ref 70–99)
Glucose-Capillary: 62 mg/dL — ABNORMAL LOW (ref 70–99)
Glucose-Capillary: 69 mg/dL — ABNORMAL LOW (ref 70–99)
Glucose-Capillary: 79 mg/dL (ref 70–99)

## 2020-10-09 LAB — MAGNESIUM: Magnesium: 2.2 mg/dL (ref 1.7–2.4)

## 2020-10-09 MED ORDER — PRISMASOL BGK 4/2.5 32-4-2.5 MEQ/L REPLACEMENT SOLN
Status: DC
  Filled 2020-10-09 (×16): qty 5000

## 2020-10-09 MED ORDER — VANCOMYCIN HCL IN DEXTROSE 1-5 GM/200ML-% IV SOLN
1000.0000 mg | INTRAVENOUS | Status: AC
  Administered 2020-10-10 – 2020-10-15 (×6): 1000 mg via INTRAVENOUS
  Filled 2020-10-09 (×6): qty 200

## 2020-10-09 MED ORDER — PRISMASOL BGK 4/2.5 32-4-2.5 MEQ/L EC SOLN
Status: DC
  Filled 2020-10-09 (×89): qty 5000

## 2020-10-09 MED ORDER — PIPERACILLIN-TAZOBACTAM 3.375 G IVPB 30 MIN
3.3750 g | Freq: Four times a day (QID) | INTRAVENOUS | Status: AC
  Administered 2020-10-09 – 2020-10-16 (×28): 3.375 g via INTRAVENOUS
  Filled 2020-10-09 (×52): qty 50

## 2020-10-09 MED ORDER — DOCUSATE SODIUM 50 MG/5ML PO LIQD
100.0000 mg | Freq: Two times a day (BID) | ORAL | Status: DC
  Administered 2020-10-09 – 2020-11-15 (×70): 100 mg
  Filled 2020-10-09 (×69): qty 10

## 2020-10-09 MED ORDER — VANCOMYCIN HCL 2000 MG/400ML IV SOLN
2000.0000 mg | Freq: Once | INTRAVENOUS | Status: AC
  Administered 2020-10-09: 08:00:00 2000 mg via INTRAVENOUS
  Filled 2020-10-09: qty 400

## 2020-10-09 MED ORDER — MIDAZOLAM HCL 2 MG/2ML IJ SOLN
1.0000 mg | INTRAMUSCULAR | Status: DC | PRN
  Administered 2020-10-09 – 2020-10-14 (×3): 1 mg via INTRAVENOUS
  Filled 2020-10-09 (×2): qty 2

## 2020-10-09 MED ORDER — DEXTROSE 50 % IV SOLN
INTRAVENOUS | Status: AC
  Administered 2020-10-09: 20:00:00 12.5 g via INTRAVENOUS
  Filled 2020-10-09: qty 50

## 2020-10-09 MED ORDER — DEXTROSE 50 % IV SOLN
12.5000 g | INTRAVENOUS | Status: AC
  Administered 2020-10-09: 23:00:00 12.5 g via INTRAVENOUS

## 2020-10-09 MED ORDER — PRISMASOL BGK 4/2.5 32-4-2.5 MEQ/L REPLACEMENT SOLN
Status: DC
  Filled 2020-10-09 (×30): qty 5000

## 2020-10-09 MED ORDER — POLYETHYLENE GLYCOL 3350 17 G PO PACK
17.0000 g | PACK | Freq: Every day | ORAL | Status: DC
  Administered 2020-10-09 – 2020-11-15 (×26): 17 g
  Filled 2020-10-09 (×28): qty 1

## 2020-10-09 MED ORDER — DEXTROSE 50 % IV SOLN: 12.5000 g | INTRAVENOUS | Status: AC

## 2020-10-09 MED ORDER — MIDAZOLAM HCL 2 MG/2ML IJ SOLN
1.0000 mg | INTRAMUSCULAR | Status: DC | PRN
  Filled 2020-10-09: qty 2

## 2020-10-09 MED ORDER — DEXTROSE 50 % IV SOLN
INTRAVENOUS | Status: AC
  Filled 2020-10-09: qty 50

## 2020-10-09 MED ORDER — HYDROMORPHONE BOLUS VIA INFUSION
2.0000 mg | Freq: Once | INTRAVENOUS | Status: AC
Start: 2020-10-09 — End: 2020-10-09
  Administered 2020-10-09: 11:00:00 2 mg via INTRAVENOUS

## 2020-10-09 NOTE — Progress Notes (Signed)
[place] for heparin Indication: atrial fibrillation  Assessment: 69 y.o. female with h/o Afib, Eliquis on hold, for heparin drip   Heparin level today 0.21 units/ml.    Goal of Therapy:  Heparin level 0.3-0.5 units/ml Monitor platelets by anticoagulation protocol: Yes   Plan:  Increase heparin drip to 1350 uts/hr Recheck HL in 8 hourse Daily hep lvl cbc  Thanks for allowing pharmacy to be a part of this patient's care.  Alanda Slim, PharmD, [facility] Clinical Pharmacist Please see AMION for all Pharmacists' Contact Phone Numbers 10/09/2020, 1:20 PM

## 2020-10-09 NOTE — Progress Notes (Signed)
Pharmacy Antibiotic Note  Candace [name] is a 69 y.o. female admitted on 08/31/2020 with sepsis.  Pharmacy has been consulted for Vanc and Zosyn dosing.  Patient on CRRT.  Plan: Vanc 2 gms IV x 1, then 1 gm IV q24hr while on CRRT Zosyn 3.375 gm IV q6hr while on CRRT Monitor dialysis, C&S and Vanc level as needed  Height: 5\' 8"  (172.7 cm) Weight: 88.5 kg (195 lb 1.7 oz) IBW/kg (Calculated) : 63.9  Temp (24hrs), Avg:98.4 F (36.9 C), Min:97.9 F (36.6 C), Max:98.9 F (37.2 C)  Recent Labs  Lab 10/05/20 0505 10/05/20 1545 10/06/20 0530 10/06/20 1652 10/07/20 0432 10/07/20 1600 10/07/20 2000 10/08/20 0321 10/08/20 0322 10/08/20 1557 10/09/20 0417  WBC 8.8  --  15.4*  --  11.7*  --   --   --  11.7*  --  10.8*  CREATININE 0.48   < > 0.34*   < > 0.74 1.07* 1.00 0.80  --  0.65 0.55   < > = values in this interval not displayed.    Estimated Creatinine Clearance: 77.2 mL/min (by C-G formula based on SCr of 0.55 mg/dL).    No Known Allergies  Antimicrobials this admission: Vanc 12/11 >>  Zosyn 12/11 >>   Thank you for allowing pharmacy to be a part of this patient's care.  Alanda Slim, PharmD, [facility] Clinical Pharmacist Please see AMION for all Pharmacists' Contact Phone Numbers 10/09/2020, 7:47 AM

## 2020-10-09 NOTE — Progress Notes (Signed)
NAMEZailee [name], MRN:  [id number], DOB:  06-May-1951, LOS: 67 ADMISSION DATE:  08/31/2020, CONSULTATION DATE:  09/07/2020 REFERRING MD:  Dr Candiss Norse, CHIEF COMPLAINT:  Acute resp failure  Brief History   69 year old female who was previously diagnosed with Covid 08/23/2020.  Admitted 11/2 with AF-RVR, found to have a perforated duodenal underwent exploratory laparotomy with Phillip Heal patch placement.  Past Medical History  Covid pneumonia Atrial fibrillation CKD stage III Diabetes mellitus Hypertension Colon cancer Hyperlipidemia  Significant Hospital Events   11/2 Admitted  11/3 OR -> perforated duodenal ulcer 11/10 progressive hemorrhagic shock, intubated, transfused, pressors, proned; started on CRRT in PM 11/16 Extubated. Re-intubated overnight due to respiratory distress and hypoxia with decreased mentation 11/18  bronch'd/ cultures sent 11/19 hgb down getting blood 09/22/2020 spiked fever resume empirical antimicrobial therapy 11/26: hemorrhagic shock, hgb 5.6, increased pressors, CT A/P  12/8: CRRT discontinued due to clotting 12/10: CRRT restarted  Consults:  Cardiology CCS Nephrology   Procedures:  11/3 Exploratory laparotomy, Phillip Heal patch, lysis of adhesion for duodenal ulceration postop day 6  R PICC 11/5 >> A line 11/9 >> out ETT 11/9 > 11/16, 11/16 >> 09/21/2020 09/21/2020 tracheostomy>> Lt Frost CVL 11/9 >> R IJ trialysis >> out HD catheter 12/1 >>   Significant Diagnostic Tests:  11/3 CT abd/ pelvis > 1. Positive for bowel perforation: Pneumoperitoneum and intermediate density free fluid in the abdomen. Prior total colectomy. The specific site of perforation is unclear-oral contrast present to the proximal jejunum has not obviously leaked. Note that there may be small bowel loops adherent to the ventral abdominal wall along the greater curve of the stomach. 2. Extensive bilateral lower lung pneumonia. No pleural effusion. 3. Other abdominal and pelvic  viscera are stable since 2015, including bilateral adrenal adenomas. Chronic renal parapelvic cysts. 4. Aortic Atherosclerosis 11/3 TTE > EF 70-75%, RV not well visualized, mildly reduced RV systolic function 03/00 CT chest/ abd/ pelvis> 1. Interval progression of diffuse bilateral hazy ground-glass airspace opacities with more focal areas of consolidation at the lung bases 2. Trace bilateral pleural effusions. 3. Postsurgical changes the abdomen as detailed above. No evidence for a postoperative abscess, however evaluation is limited by lack of IV contrast. 4. There is a 1.9 cm cystic appearing lesion located in the pancreatic body. This was not present on the patient's CT from 2015.  Follow-up with an outpatient contrast enhanced MRI is recommended. 5. The endometrial stripe appears diffusely thickened. Follow-up with pelvic ultrasound is recommended. Aortic Atherosclerosis 11/14 LE doppler studies > + DVT of right posterior tibial and peroneal vein, +dVT of left posterior tibial vein  11/26 CT ABD PEL > liver unremarkable, distended gallbladder with layering tiny gallstones versus sludge, no duct dilatation, mild hyperdensity right upper pole renal collecting system new.  No evidence of retroperitoneal bleeding.  Multifocal lower lobe predominant pulmonary infiltrates/pneumonia.  Small left pleural effusion.  Micro Data:  11/10 MRSA PCR > neg 11/10 BC x 2 > neg 09/22/2020 blood cultures x2>> S epi.  09/22/2020 sputum culture>> MRSA Blood 12/1 >> negative. S.epi 1 out of 2, likely contaminant  Antimicrobials:  azithro 11/2 >11/3 Ceftriaxone 11/2  Fluconazole 11/3 Zosyn 11/3 >> 11/7 Vanc 11/10 off Cefepime 11/10 > 11/16 09/22/2020 vancomycin for MRSA PNA >> 12/2  Subjective:  Agitated with increased HR and tachypnea overnight Changed fentanyl ggt to dilaudid gtt Increased vasopressor requirement Unable to remove volume via CRRT Updated daughter at bedside  Objective   Blood  pressure 99/67, pulse Marland Kitchen)  101, temperature 98.9 F (37.2 C), temperature source Oral, resp. rate (!) 28, height 5\' 8"  (1.727 m), weight 88.5 kg, SpO2 100 %. CVP:  [10 mmHg-24 mmHg] 16 mmHg  Vent Mode: PRVC FiO2 (%):  [60 %-100 %] 80 % Set Rate:  [26 bmp-28 bmp] 28 bmp Vt Set:  [450 mL] 450 mL PEEP:  [12 cmH20] 12 cmH20 Plateau Pressure:  [31 cmH20-33 cmH20] 31 cmH20   Intake/Output Summary (Last 24 hours) at 10/09/2020 0726 Last data filed at 10/09/2020 0700 Gross per 24 hour  Intake 3436.34 ml  Output 2218 ml  Net 1218.34 ml   Filed Weights   10/06/20 0500 10/08/20 0338 10/09/20 0500  Weight: 86.8 kg 91.3 kg 88.5 kg   Physical Exam: General: Chronically ill-appearing, no acute distress, sedated HENT: [place], AT, OP clear, MMM Neck: Cuffed trach in place, c/d/i Eyes: EOMI, no scleral icterus Respiratory: Coarse breath sounds bilaterally R>L.  No wheezing. Diminished basilar breath sounds Cardiovascular: RRR, -M/R/G, no JVD GI: BS+, soft, nontender Extremities:Anasarca x 4 Neuro: Sedated   Assessment & Plan:   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, status post tracheostomy Complicated by healthcare associated pneumonia due to MRSA 12/11: Worsening respiratory status and shock. Likely due to volume overload and possible new pneumonia PLAN -  --Full vent support --Start broad spectrum antibiotics --Hold SBT/WUA in setting of critical illness --ABG PRN --Unable to remove volume in setting of worsening shorck  Acute metabolic encephalopathy in the setting of renal failure, shock, sepsis PLAN -  --Continue fentanyl, precedex gtt  --Continue oxycodone 5 mg every 8 hour --Continue Seroquel and Klonopin --Delirium precautions  Multifactorial shock Previously hemorrhagic shock requiring PRBCs, Septic shock, MRSA pneumonia s/p 8 days abx 1/4 bottles BCX with Meth R Staph Epi, likely contaminant 12/11: Worsening respiratory status  and shock. CXR with interval development of multifocal pneumonia R>L PLAN -  --Start broad spectrum antibiotics --Patient continued to require high-dose vasopressors, unable to wean off --Continue levophed, titrate as able to keep MAP >60 --Continue midodrine 20 mg 3 times daily --Continue contact isolation  AKI, oliguric/anuric --Nephrology consulted.  --CRRT resumed on 12/10. No volume removal due to worsening shock --Monitor UOP/Cr  Duodenal ulcer perforation status post ex lap and Graham's patch --Appreciate CTS assistance and management Ultimately would need PEG for continued ongoing aggressive care, however we are unable to do  so currently because gastric insufflation would put her duodenal patch at risk.  CCS has discussed this with family   Paroxysmal A. fib, currently in sinus rhythm --Patient is in sinus rhythm now --Continue heparin infusion for stroke prophylaxis  Diabetes type 2 --SSI, lantus   1.9 cm cystic appearing lesion located in the pancreatic body Plan --No intervention at this time, plan outpatient follow-up  History of stage IV colon cancer Status post pulmonary metastatectomies at [facility] + Chemo --No intervention at this time  Indeterminate bilateral DVT --On heparin infusion  Best practice:  Diet: Tube feeds Pain/Anxiety/Delirium protocol (if indicated) Precedex, fentanyl, oxycodone per tube, clonazepam VAP protocol (if indicated): yes DVT prophylaxis: IV heparin GI prophylaxis: Protonix Glucose control: lantus + ssi  Mobility: Bedrest Code Status: Full Goals of care discussion   12/9: Spoke with patient's daughter at bedside at length.  Discussed severity of illness and likely overall worsening despite max efforts.  She and her sister want to continue aggressive support.  RN and RT involved in discussion as well.   12/10: Daughter confirmed aggressive measures  Next multidisciplinary goals of care discussion 12/14 Disposition: ICU  The patient  is critically ill with multiple organ systems failure and requires high complexity decision making for assessment and support, frequent evaluation and titration of therapies, application of advanced monitoring technologies and extensive interpretation of multiple databases.  Independent Critical Care Time: 50 Minutes.   Rodman Pickle, M.D. [facility] Pulmonary/Critical Care Medicine 10/09/2020 7:27 AM   Please see Amion for pager number to reach on-call Pulmonary and Critical Care Team.

## 2020-10-09 NOTE — Progress Notes (Signed)
[place] for heparin Indication: atrial fibrillation  Assessment: 69 y.o. female with h/o Afib, Eliquis on hold, for heparin drip   Heparin level this am 0.13 units/ml.    Goal of Therapy:  Heparin level 0.3-0.5 units/ml Monitor platelets by anticoagulation protocol: Yes   Plan:  Increase heparin drip to 1250 uts/hr Daily hep lvl cbc  Thanks for allowing pharmacy to be a part of this patient's care.  Excell Seltzer, PharmD Clinical Pharmacist 10/09/2020 4:57 AM '

## 2020-10-09 NOTE — Progress Notes (Signed)
[facility] Progress Note     Assessment/ Plan:   1. AKI- oliguric presumably due to ischemic ATN in setting of shock. Started on CRRT 09/08/20 due to persistent oliguria and hyperkalemia.Failed attempt at [facility] AM; CRRT 12/5-12/7. Restarted 12/9 1. Continue with CRRT. Changed all bags to 4K/2.5Cal 2. Net UF 0 to negative 50 cc/h as tolerated (UF limited by BP) 3. At this junction, there does not seem to be any meaningful renal recovery 4. Not a long-term HD candidate given multiple comorbities, requiring full vent support, poor functionality, and encephalopathy   1. Hemorrhagic shock- ongoing blood loss and requiring pressors. 1. Transfuse as needed 2. Septic shock due to MRSA pneumonia. Completed vancomycin on 12/2. Restarting broad spec abx. Pressor support ongoing, on midodrine 42m tid 3. Perforated duodenal ulcer- s/p exploratory lap and Graham patch placement per surgery. 4. Acute hypoxic/hypercapnic respiratory failure due to ARDS from Recent Covid-19 PNA- currently on vent via trach per PCCM.  5. Thrombocytopenia- follow 6. P. Atrial fibrillation- no anticoagulation due to ongoing GI bleed. 7. Vascular access- RIJ trialysis catheter 09/08/20 then removed. Left subclavian trialysis catheter placed 09/15/20 andremoved and new right [place] HD catheter placed on 09/29/20. [facility]  8. Severe protein malnutrition- per primary svc. 9. Disposition- poor prognosis. Ethics consult?   Subjective:   Unable to UF given BP. Increased pressor requirements. Tachycardia and tachypnea overnight (temporarily requiring increased FIO2 req).   Objective:   BP (!) 85/61    Pulse (!) 121    Temp 97.8 F (36.6 C) (Axillary)    Resp (!) 37    Ht 5' 8"  (1.727 m)    Wt 88.5 kg    SpO2 94%    BMI 29.67 kg/m   Intake/Output Summary (Last 24 hours) at 10/09/2020 1127 Last data filed at 10/09/2020 1100 Gross per 24 hour  Intake 3838.43 ml   Output 2871 ml  Net 967.43 ml   Weight change: -2.8 kg  Physical Exam: Gen:on vent via trach, sedated, chronically ill appearing CV: s1s2, rrr Resp:ventilated BS bilaterally Abd:+BS, soft, NT/ND Ext: anasarca Neuro: sedated Access: Rt scv temp   Imaging: DG CHEST PORT 1 VIEW  Result Date: 10/08/2020 CLINICAL DATA:  Hypoxemia. EXAM: PORTABLE CHEST 1 VIEW COMPARISON:  October 04, 2020. FINDINGS: Stable cardiomediastinal silhouette. Tracheostomy and feeding tubes are unchanged in position. Right-sided PICC line is unchanged. No pneumothorax is noted. Stable bilateral lung opacities are noted, right greater than left, consistent with multifocal pneumonia and probable small bilateral pleural effusions. Bony thorax is unremarkable. IMPRESSION: Stable bilateral lung opacities are noted, right greater than left, consistent with multifocal pneumonia and probable small bilateral pleural effusions. Electronically Signed   By: JMarijo ConceptionM.D.   On: 10/08/2020 18:52    Labs: BMET Recent Labs  Lab 10/06/20 0530 10/06/20 1119 10/06/20 1652 10/07/20 0432 10/07/20 1600 10/07/20 2000 10/08/20 0321 10/08/20 1557 10/08/20 1825 10/09/20 0417  NA 138   < > 137 137 136 136 133* 135 135 134*  K 4.2   < > 4.5 4.6 6.6* 5.9* 4.3 3.5 3.6 3.5  CL 106  --  106 107 103 107 102 103  --  101  CO2 23  --  22 20* 22 19* 22 22  --  24  GLUCOSE 114*  --  101* 108* 153* 249* 227* 107*  --  190*  BUN 27*  --  39* 59* 73* 78* 59* 45*  --  36*  CREATININE 0.34*  --  0.52 0.74 1.07* 1.00 0.80 0.65  --  0.55  CALCIUM 7.8*  --  7.8* 8.4* 9.3 8.3* 8.3* 8.4*  --  8.6*  PHOS 3.4  --  3.7 5.0* 6.8*  --  5.0* 4.7*  --  3.2   < > = values in this interval not displayed.   CBC Recent Labs  Lab 10/04/20 0516 10/05/20 0505 10/06/20 0530 10/06/20 1119 10/07/20 0432 10/08/20 0322 10/08/20 1825 10/09/20 0417  WBC 6.3   < > 15.4*  --  11.7* 11.7*  --  10.8*  NEUTROABS 5.2  --   --   --   --   --   --    --   HGB 8.8*   < > 6.6*   < > 9.3* 8.5* 9.2* 8.7*  HCT 27.7*   < > 21.6*   < > 29.8* 26.9* 27.0* 28.6*  MCV 93.6   < > 98.6  --  96.4 95.7  --  97.3  PLT 75*   < > 118*  --  192 149*  --  153   < > = values in this interval not displayed.    Medications:     sodium chloride   Intravenous Once   sodium chloride   Intravenous Once   sodium chloride   Intravenous Once   B-complex with vitamin C  1 tablet Per Tube Daily   chlorhexidine gluconate (MEDLINE KIT)  15 mL Mouth Rinse BID   Chlorhexidine Gluconate Cloth  6 each Topical Daily   clonazepam  1 mg Per Tube BID   collagenase   Topical Daily   darbepoetin (ARANESP) injection - DIALYSIS  60 mcg Subcutaneous Q Tue-1800   docusate  100 mg Per Tube BID   feeding supplement (PROSource TF)  90 mL Per Tube TID   insulin aspart  0-20 Units Subcutaneous Q4H   insulin aspart  8 Units Subcutaneous Q4H   insulin glargine  25 Units Subcutaneous Daily   mouth rinse  15 mL Mouth Rinse QID   midazolam  1 mg Intravenous Once   midodrine  20 mg Per Tube TID WC   oxyCODONE  5 mg Per Tube Q8H   pantoprazole sodium  40 mg Per Tube BID   polyethylene glycol  17 g Per Tube Daily   QUEtiapine  100 mg Per Tube BID   rocuronium  100 mg Intravenous Once   sodium chloride flush  10-40 mL Intracatheter Q12H

## 2020-10-09 NOTE — Progress Notes (Signed)
[place] for heparin Indication: atrial fibrillation  Assessment: 69 y.o. female with h/o Afib, Eliquis on hold, for heparin drip   Heparin level = 0.34  Goal of Therapy:  Heparin level 0.3-0.5 units/ml Monitor platelets by anticoagulation protocol: Yes   Plan:  Continue heparin at 1350 units/hr Daily hep lvl cbc  Thanks for allowing pharmacy to be a part of this patient's care.   Hildred Laser, PharmD Clinical Pharmacist **Pharmacist phone directory can now be found on [email or web address] ([email or web address]).  Listed under [place].

## 2020-10-09 NOTE — Progress Notes (Signed)
At 2030 on 10-08-20 waisted 100 mL Fentanyl and witnessed by [facility]

## 2020-10-09 NOTE — Progress Notes (Signed)
PT Cancellation Note  Patient Details Name: Candace [name] MRN: [id number] DOB: 05-25-1951   Cancelled Treatment:    Reason Eval/Treat Not Completed: Medical issues which prohibited therapy.  Pt continues with CRRT.  Not appropriate to participate with therapies today. 10/09/2020  Ginger Carne., PT Acute Rehabilitation Services [phone]  (pager) [phone]  (office)   Tessie Fass [name] 10/09/2020, 11:46 AM

## 2020-10-09 NOTE — Progress Notes (Signed)
PCCM Interval Note  Updated daughter, [name]. Addressed questions regarding CRRT including if patient was eligible for kidney transplant. I stated that in her acute condition she would not be a candidate for transplant. We discussed patient's inability to remove fluid due to her worsening hypotension and how the fluid will potentially cause issues including worsening respiratory failure. She remains hopeful that her mother will make a recovery to allow for volume removal and expresses appreciation for our care.  Rodman Pickle, M.D. [facility] Pulmonary/Critical Care Medicine 10/09/2020 10:19 AM

## 2020-10-10 LAB — GLUCOSE, CAPILLARY
Glucose-Capillary: 108 mg/dL — ABNORMAL HIGH (ref 70–99)
Glucose-Capillary: 109 mg/dL — ABNORMAL HIGH (ref 70–99)
Glucose-Capillary: 137 mg/dL — ABNORMAL HIGH (ref 70–99)
Glucose-Capillary: 195 mg/dL — ABNORMAL HIGH (ref 70–99)
Glucose-Capillary: 199 mg/dL — ABNORMAL HIGH (ref 70–99)
Glucose-Capillary: 219 mg/dL — ABNORMAL HIGH (ref 70–99)
Glucose-Capillary: 244 mg/dL — ABNORMAL HIGH (ref 70–99)
Glucose-Capillary: 248 mg/dL — ABNORMAL HIGH (ref 70–99)
Glucose-Capillary: 258 mg/dL — ABNORMAL HIGH (ref 70–99)
Glucose-Capillary: 267 mg/dL — ABNORMAL HIGH (ref 70–99)

## 2020-10-10 LAB — CBC
HCT: 27 % — ABNORMAL LOW (ref 36.0–46.0)
Hemoglobin: 8.8 g/dL — ABNORMAL LOW (ref 12.0–15.0)
MCH: 30.3 pg (ref 26.0–34.0)
MCHC: 32.6 g/dL (ref 30.0–36.0)
MCV: 93.1 fL (ref 80.0–100.0)
Platelets: 171 10*3/uL (ref 150–400)
RBC: 2.9 MIL/uL — ABNORMAL LOW (ref 3.87–5.11)
RDW: 16.2 % — ABNORMAL HIGH (ref 11.5–15.5)
WBC: 10.3 10*3/uL (ref 4.0–10.5)
nRBC: 0.4 % — ABNORMAL HIGH (ref 0.0–0.2)

## 2020-10-10 LAB — RENAL FUNCTION PANEL
Albumin: 1.2 g/dL — ABNORMAL LOW (ref 3.5–5.0)
Albumin: 1.2 g/dL — ABNORMAL LOW (ref 3.5–5.0)
Anion gap: 8 (ref 5–15)
Anion gap: 10 (ref 5–15)
BUN: 25 mg/dL — ABNORMAL HIGH (ref 8–23)
BUN: 24 mg/dL — ABNORMAL HIGH (ref 8–23)
CO2: 26 mmol/L (ref 22–32)
CO2: 24 mmol/L (ref 22–32)
Calcium: 9 mg/dL (ref 8.9–10.3)
Calcium: 8.5 mg/dL — ABNORMAL LOW (ref 8.9–10.3)
Chloride: 101 mmol/L (ref 98–111)
Chloride: 99 mmol/L (ref 98–111)
Creatinine, Ser: 0.44 mg/dL (ref 0.44–1.00)
Creatinine, Ser: 0.52 mg/dL (ref 0.44–1.00)
GFR, Estimated: >60 mL/min (ref >60)
GFR, Estimated: >60 mL/min (ref >60)
Glucose, Bld: 113 mg/dL — ABNORMAL HIGH (ref 70–99)
Glucose, Bld: 265 mg/dL — ABNORMAL HIGH (ref 70–99)
Phosphorus: 2.7 mg/dL (ref 2.5–4.6)
Phosphorus: 3.6 mg/dL (ref 2.5–4.6)
Potassium: 3.7 mmol/L (ref 3.5–5.1)
Potassium: 5.1 mmol/L (ref 3.5–5.1)
Sodium: 135 mmol/L (ref 135–145)
Sodium: 133 mmol/L — ABNORMAL LOW (ref 135–145)

## 2020-10-10 LAB — HEPARIN LEVEL (UNFRACTIONATED): Heparin Unfractionated: 0.39 IU/mL (ref 0.30–0.70)

## 2020-10-10 LAB — MAGNESIUM: Magnesium: 2.1 mg/dL (ref 1.7–2.4)

## 2020-10-10 MED ORDER — INSULIN ASPART 100 UNIT/ML ~~LOC~~ SOLN
0.0000 [IU] | SUBCUTANEOUS | Status: DC
  Administered 2020-10-10: 13:00:00 2 [IU] via SUBCUTANEOUS
  Administered 2020-10-10: 20:00:00 3 [IU] via SUBCUTANEOUS
  Administered 2020-10-10 (×2): 2 [IU] via SUBCUTANEOUS
  Administered 2020-10-11: 04:00:00 4 [IU] via SUBCUTANEOUS
  Administered 2020-10-11: 3 [IU] via SUBCUTANEOUS
  Administered 2020-10-11: 08:00:00 4 [IU] via SUBCUTANEOUS

## 2020-10-10 MED ORDER — METOPROLOL TARTRATE 5 MG/5ML IV SOLN
5.0000 mg | Freq: Once | INTRAVENOUS | Status: AC
  Administered 2020-10-10: 14:00:00 5 mg via INTRAVENOUS
  Filled 2020-10-10: qty 5

## 2020-10-10 NOTE — Progress Notes (Signed)
[facility] Progress Note     Assessment/ Plan:   1. AKI- oliguric presumably due to ischemic ATN in setting of shock. Started on CRRT 09/08/20 due to persistent oliguria and hyperkalemia.Failed attempt at [facility] AM; CRRT 12/5-12/7. Restarted 12/9 1. Continue with CRRT. Changed all bags to 4K/2.5Cal 2. Net UF 0 to negative 50 cc/h as tolerated (UF limited by BP) 3. At this junction, there does not seem to be any meaningful renal recovery 4. Not a long-term HD candidate given multiple comorbities, requiring full vent support, poor functionality, and encephalopathy   1. Hemorrhagic shock- on pressors, hgb relatively stable today 1. Transfuse as needed 2. Septic shock due to MRSA pneumonia. Completed vancomycin on 12/2. Restarted broad spec abx. Pressor support ongoing, on midodrine 5m tid 3. Perforated duodenal ulcer- s/p exploratory lap and Graham patch placement per surgery. 4. Acute hypoxic/hypercapnic respiratory failure due to ARDS from Recent Covid-19 PNA- currently on vent via trach per PCCM.  5. Thrombocytopenia- follow 6. P. Atrial fibrillation- no anticoagulation due to ongoing GI bleed. 7. Vascular access- RIJ trialysis catheter 09/08/20 then removed. Left subclavian trialysis catheter placed 09/15/20 andremoved and new right [place] HD catheter placed on 09/29/20. 1. [facility].At  8. Severe protein malnutrition- per primary svc. 9. Disposition- poor prognosis. Ethics consult?   Subjective:   No acute events/changes. Still on pressors.able to pull 50cc/hr on crrt. Still having intermittent episodes of agitation/tachycardia   Objective:   BP (!) 86/67   Pulse (!) 132   Temp 99 F (37.2 C) (Axillary)   Resp (!) 36   Ht 5' 8"  (1.727 m)   Wt 88.5 kg   SpO2 (!) 82%   BMI 29.67 kg/m   Intake/Output Summary (Last 24 hours) at 10/10/2020 1308 Last data filed at 10/10/2020 1300 Gross per 24 hour  Intake 3571.06 ml   Output 4219 ml  Net -647.94 ml   Weight change: 0 kg  Physical Exam: Gen:on vent via trach, chronically ill appearing CV: s1s2, rrr Resp:ventilated BS bilaterally Abd:+BS, soft, NT/ND Ext: anasarca Neuro: awake but no meaningful movements/response Access: Rt scv temp   Imaging: DG CHEST PORT 1 VIEW  Result Date: 10/08/2020 CLINICAL DATA:  Hypoxemia. EXAM: PORTABLE CHEST 1 VIEW COMPARISON:  October 04, 2020. FINDINGS: Stable cardiomediastinal silhouette. Tracheostomy and feeding tubes are unchanged in position. Right-sided PICC line is unchanged. No pneumothorax is noted. Stable bilateral lung opacities are noted, right greater than left, consistent with multifocal pneumonia and probable small bilateral pleural effusions. Bony thorax is unremarkable. IMPRESSION: Stable bilateral lung opacities are noted, right greater than left, consistent with multifocal pneumonia and probable small bilateral pleural effusions. Electronically Signed   By: JMarijo ConceptionM.D.   On: 10/08/2020 18:52    Labs: BMET Recent Labs  Lab 10/07/20 0432 10/07/20 1600 10/07/20 2000 10/08/20 0321 10/08/20 1557 10/08/20 1825 10/09/20 0417 10/09/20 1611 10/10/20 0500  NA 137 136 136 133* 135 135 134* 135 135  K 4.6 6.6* 5.9* 4.3 3.5 3.6 3.5 3.5 3.7  CL 107 103 107 102 103  --  101 102 101  CO2 20* 22 19* 22 22  --  24 25 26   GLUCOSE 108* 153* 249* 227* 107*  --  190* 151* 113*  BUN 59* 73* 78* 59* 45*  --  36* 28* 25*  CREATININE 0.74 1.07* 1.00 0.80 0.65  --  0.55 0.50 0.44  CALCIUM 8.4* 9.3 8.3* 8.3* 8.4*  --  8.6* 9.0 9.0  PHOS 5.0* 6.8*  --  5.0* 4.7*  --  3.2 2.9 2.7   CBC Recent Labs  Lab 10/04/20 0516 10/05/20 0505 10/07/20 0432 10/08/20 0322 10/08/20 1825 10/09/20 0417 10/10/20 0500  WBC 6.3   < > 11.7* 11.7*  --  10.8* 10.3  NEUTROABS 5.2  --   --   --   --   --   --   HGB 8.8*   < > 9.3* 8.5* 9.2* 8.7* 8.8*  HCT 27.7*   < > 29.8* 26.9* 27.0* 28.6* 27.0*  MCV 93.6   < > 96.4  95.7  --  97.3 93.1  PLT 75*   < > 192 149*  --  153 171   < > = values in this interval not displayed.    Medications:    . sodium chloride   Intravenous Once  . sodium chloride   Intravenous Once  . sodium chloride   Intravenous Once  . B-complex with vitamin C  1 tablet Per Tube Daily  . chlorhexidine gluconate (MEDLINE KIT)  15 mL Mouth Rinse BID  . Chlorhexidine Gluconate Cloth  6 each Topical Daily  . clonazepam  1 mg Per Tube BID  . collagenase   Topical Daily  . darbepoetin (ARANESP) injection - DIALYSIS  60 mcg Subcutaneous Q Tue-1800  . docusate  100 mg Per Tube BID  . feeding supplement (PROSource TF)  90 mL Per Tube TID  . insulin aspart  0-6 Units Subcutaneous Q4H  . insulin glargine  25 Units Subcutaneous Daily  . mouth rinse  15 mL Mouth Rinse QID  . metoprolol tartrate  5 mg Intravenous Once  . midazolam  1 mg Intravenous Once  . midodrine  20 mg Per Tube TID WC  . oxyCODONE  5 mg Per Tube Q8H  . pantoprazole sodium  40 mg Per Tube BID  . polyethylene glycol  17 g Per Tube Daily  . QUEtiapine  100 mg Per Tube BID  . rocuronium  100 mg Intravenous Once  . sodium chloride flush  10-40 mL Intracatheter Q12H

## 2020-10-10 NOTE — Progress Notes (Signed)
NAMEKawena [name], MRN:  [id number], DOB:  05-14-1951, LOS: 22 ADMISSION DATE:  08/31/2020, CONSULTATION DATE:  09/07/2020 REFERRING MD:  Dr Candiss Norse, CHIEF COMPLAINT:  Acute resp failure  Brief History   69 year old female who was previously diagnosed with Covid 08/23/2020.  Admitted 11/2 with AF-RVR, found to have a perforated duodenal underwent exploratory laparotomy with Phillip Heal patch placement.  Past Medical History  Covid pneumonia Atrial fibrillation CKD stage III Diabetes mellitus Hypertension Colon cancer Hyperlipidemia  Significant Hospital Events   11/2 Admitted  11/3 OR -> perforated duodenal ulcer 11/10 progressive hemorrhagic shock, intubated, transfused, pressors, proned; started on CRRT in PM 11/16 Extubated. Re-intubated overnight due to respiratory distress and hypoxia with decreased mentation 11/18  bronch'd/ cultures sent 11/19 hgb down getting blood 09/22/2020 spiked fever resume empirical antimicrobial therapy 11/26: hemorrhagic shock, hgb 5.6, increased pressors, CT A/P  12/8: CRRT discontinued due to clotting 12/10-12/12: CRRT restarted. Continues to have episodes of tachycardia, tachypnea that seem to improve with pain management   Consults:  Cardiology [facility] Nephrology   Procedures:  11/3 Exploratory laparotomy, Phillip Heal patch, lysis of adhesion for duodenal ulceration postop day 6  R PICC 11/5 >> A line 11/9 >> out ETT 11/9 > 11/16, 11/16 >> 09/21/2020 09/21/2020 tracheostomy>> Lt [place] CVL 11/9 >> R IJ trialysis >> out HD catheter 12/1 >>   Significant Diagnostic Tests:  11/3 CT abd/ pelvis > 1. Positive for bowel perforation: Pneumoperitoneum and intermediate density free fluid in the abdomen. Prior total colectomy. The specific site of perforation is unclear-oral contrast present to the proximal jejunum has not obviously leaked. Note that there may be small bowel loops adherent to the ventral abdominal wall along the greater curve of the  stomach. 2. Extensive bilateral lower lung pneumonia. No pleural effusion. 3. Other abdominal and pelvic viscera are stable since 2015, including bilateral adrenal adenomas. Chronic renal parapelvic cysts. 4. Aortic Atherosclerosis 11/3 TTE > EF 70-75%, RV not well visualized, mildly reduced RV systolic function 77/82 CT chest/ abd/ pelvis> 1. Interval progression of diffuse bilateral hazy ground-glass airspace opacities with more focal areas of consolidation at the lung bases 2. Trace bilateral pleural effusions. 3. Postsurgical changes the abdomen as detailed above. No evidence for a postoperative abscess, however evaluation is limited by lack of IV contrast. 4. There is a 1.9 cm cystic appearing lesion located in the pancreatic body. This was not present on the patient's CT from 2015.  Follow-up with an outpatient contrast enhanced MRI is recommended. 5. The endometrial stripe appears diffusely thickened. Follow-up with pelvic ultrasound is recommended. Aortic Atherosclerosis 11/14 LE doppler studies > + DVT of right posterior tibial and peroneal vein, +dVT of left posterior tibial vein  11/26 CT ABD PEL > liver unremarkable, distended gallbladder with layering tiny gallstones versus sludge, no duct dilatation, mild hyperdensity right upper pole renal collecting system new.  No evidence of retroperitoneal bleeding.  Multifocal lower lobe predominant pulmonary infiltrates/pneumonia.  Small left pleural effusion.  Micro Data:  11/10 MRSA PCR > neg 11/10 BC x 2 > neg 09/22/2020 blood cultures x2>> S epi.  09/22/2020 sputum culture>> MRSA Blood 12/1 >> negative. S.epi 1 out of 2, likely contaminant  Antimicrobials:  azithro 11/2 >11/3 Ceftriaxone 11/2  Fluconazole 11/3 Zosyn 11/3 >> 11/7 Vanc 11/10 off Cefepime 11/10 > 11/16 09/22/2020 vancomycin for MRSA PNA >> 12/2  Vanc 12/11> Zosyn 12/11>  Subjective:  Overnight able to remove ~50cc/hr via CRRT. Intermittent episodes of  agitation and tachycardia that improve  with dilaudid bolus.  Restarted broad spectrum antibiotics for worsening hypotension  Objective   Blood pressure (!) 91/58, pulse (!) 108, temperature 97.7 F (36.5 C), temperature source Axillary, resp. rate (!) 36, height 5\' 8"  (1.727 m), weight 88.5 kg, SpO2 100 %. CVP:  [11 mmHg-18 mmHg] 14 mmHg  Vent Mode: PRVC FiO2 (%):  [60 %-70 %] 60 % Set Rate:  [28 bmp] 28 bmp Vt Set:  [450 mL] 450 mL PEEP:  [12 cmH20] 12 cmH20 Plateau Pressure:  [30 cmH20-33 cmH20] 32 cmH20   Intake/Output Summary (Last 24 hours) at 10/10/2020 1041 Last data filed at 10/10/2020 1000 Gross per 24 hour  Intake 3785.55 ml  Output 4451 ml  Net -665.45 ml   Filed Weights   10/08/20 0338 10/09/20 0500 10/10/20 0500  Weight: 91.3 kg 88.5 kg 88.5 kg   Physical Exam: General: Chronically ill-appearing, sedated, on CRRT HENT: [place], AT, OP clear, MMM Neck: Cuffed trach in place, c/d/i Eyes: EOMI, no scleral icterus Respiratory: Coarse breath sounds bilaterally. No crackles, wheezing or rales Cardiovascular: RRR, -M/R/G, no JVD GI: BS+, soft, nontender Extremities:Anasarca x 4,-tenderness Neuro: Eyes open, distant gaze, unable to follow commands, no withdrawal to pain   Assessment & Plan:   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, status post tracheostomy Complicated by healthcare associated pneumonia due to MRSA 12/11: Worsening respiratory status and shock. Likely due to volume overload and possible new pneumonia PLAN -  --Full vent support --Started broad spectrum antibiotics 12/11 --Hold SBT/WUA in setting of critical illness --ABG PRN --Volume removal via CRRT though limited by worsening hypotension  Acute metabolic encephalopathy in the setting of renal failure, shock, sepsis PLAN -  --Continue fentanyl, precedex gtt  --Continue oxycodone 5 mg every 8 hour --Continue Seroquel and Klonopin --Delirium  precautions  Multifactorial shock Previously hemorrhagic shock requiring PRBCs, Septic shock, MRSA pneumonia s/p 8 days abx 1/4 bottles BCX with Meth R Staph Epi, likely contaminant 12/11: Worsening respiratory status and shock. CXR with interval development of multifocal pneumonia R>L PLAN -  --Started broad spectrum antibiotics 12/11 --Obtain blood and respiratory culture --Patient continued to require high-dose vasopressors.  --Added vasopressin yesterday --Continue levophed, titrate as able to keep MAP >60 --Continue midodrine 20 mg 3 times daily --Continue contact isolation  AKI, oliguric/anuric --Nephrology consulted.  --CRRT resumed on 12/10 --Monitor UOP/Cr  Duodenal ulcer perforation status post ex lap and Graham's patch --Appreciate CTS assistance and management Ultimately would need PEG for continued ongoing aggressive care, however we are unable to do  so currently because gastric insufflation would put her duodenal patch at risk.  CCS has discussed this with family   Paroxysmal A. fib, currently in sinus rhythm --Patient is in sinus rhythm now --Continue heparin infusion for stroke prophylaxis  Diabetes type 2 --SSI, lantus   1.9 cm cystic appearing lesion located in the pancreatic body Plan --No intervention at this time, plan outpatient follow-up  History of stage IV colon cancer Status post pulmonary [facility]ct[facility] + Chemo --No intervention at this time  Indeterminate bilateral DVT --On heparin infusion  Best practice:  Diet: Tube feeds Pain/Anxiety/Delirium protocol (if indicated) Precedex, fentanyl, oxycodone per tube, clonazepam VAP protocol (if indicated): yes DVT prophylaxis: IV heparin GI prophylaxis: Protonix Glucose control: lantus + ssi  Mobility: Bedrest Code Status: Full Goals of care discussion   12/9: Spoke with patient's daughter at bedside at length.  Discussed severity of illness and likely overall worsening despite max  efforts.  She and her sister want to continue aggressive support.  RN and RT involved in discussion as well.   12/12: Daughter confirmed aggressive measures  Next multidisciplinary goals of care discussion 12/14 Disposition: ICU  The patient is critically ill with multiple organ systems failure and requires high complexity decision making for assessment and support, frequent evaluation and titration of therapies, application of advanced monitoring technologies and extensive interpretation of multiple databases.  Independent Critical Care Time: 61 Minutes.   Rodman Pickle, M.D. [facility] Pulmonary/Critical Care Medicine 10/10/2020 10:41 AM   Please see Amion for pager number to reach on-call Pulmonary and Critical Care Team.

## 2020-10-10 NOTE — Progress Notes (Signed)
[place] for heparin Indication: atrial fibrillation  Assessment: 69 y.o. female with h/o Afib, Eliquis on hold, for heparin drip   Heparin level = 0.39  Goal of Therapy:  Heparin level 0.3-0.5 units/ml Monitor platelets by anticoagulation protocol: Yes   Plan:  Continue heparin at 1350 units/hr Daily hep lvl cbc  Thanks for allowing pharmacy to be a part of this patient's care.   Alanda Slim, PharmD, [facility] Clinical Pharmacist Please see AMION for all Pharmacists' Contact Phone Numbers 10/10/2020, 7:42 AM  .

## 2020-10-10 NOTE — Progress Notes (Signed)
eLink Physician-Brief Progress Note Patient Name: Candace [name] DOB: August 15, 1951 MRN: [id number]   Date of Service  10/10/2020  HPI/Events of Note  Patient with hypoglycemic episodes x 2 requiring D 50 pushes.  eICU Interventions  Aggressive SSI protocol discontinued, closer monitoring of blood sugar ordered, MD notification for blood sugar that falls outside parameters.        Kerry Kass Malayzia Laforte 10/10/2020, 4:56 AM

## 2020-10-11 LAB — GLUCOSE, CAPILLARY
Glucose-Capillary: 124 mg/dL — ABNORMAL HIGH (ref 70–99)
Glucose-Capillary: 186 mg/dL — ABNORMAL HIGH (ref 70–99)
Glucose-Capillary: 236 mg/dL — ABNORMAL HIGH (ref 70–99)
Glucose-Capillary: 306 mg/dL — ABNORMAL HIGH (ref 70–99)
Glucose-Capillary: 317 mg/dL — ABNORMAL HIGH (ref 70–99)
Glucose-Capillary: 333 mg/dL — ABNORMAL HIGH (ref 70–99)
Glucose-Capillary: 347 mg/dL — ABNORMAL HIGH (ref 70–99)

## 2020-10-11 LAB — RENAL FUNCTION PANEL
Albumin: 1.3 g/dL — ABNORMAL LOW (ref 3.5–5.0)
Albumin: 1.9 g/dL — ABNORMAL LOW (ref 3.5–5.0)
Anion gap: 9 (ref 5–15)
Anion gap: 10 (ref 5–15)
BUN: 24 mg/dL — ABNORMAL HIGH (ref 8–23)
BUN: 21 mg/dL (ref 8–23)
CO2: 23 mmol/L (ref 22–32)
CO2: 25 mmol/L (ref 22–32)
Calcium: 8.8 mg/dL — ABNORMAL LOW (ref 8.9–10.3)
Calcium: 9.5 mg/dL (ref 8.9–10.3)
Chloride: 99 mmol/L (ref 98–111)
Chloride: 100 mmol/L (ref 98–111)
Creatinine, Ser: 0.46 mg/dL (ref 0.44–1.00)
Creatinine, Ser: 0.48 mg/dL (ref 0.44–1.00)
GFR, Estimated: >60 mL/min (ref >60)
GFR, Estimated: >60 mL/min (ref >60)
Glucose, Bld: 381 mg/dL — ABNORMAL HIGH (ref 70–99)
Glucose, Bld: 258 mg/dL — ABNORMAL HIGH (ref 70–99)
Phosphorus: 3.5 mg/dL (ref 2.5–4.6)
Phosphorus: 3.4 mg/dL (ref 2.5–4.6)
Potassium: 4.8 mmol/L (ref 3.5–5.1)
Potassium: 4.5 mmol/L (ref 3.5–5.1)
Sodium: 131 mmol/L — ABNORMAL LOW (ref 135–145)
Sodium: 135 mmol/L (ref 135–145)

## 2020-10-11 LAB — CBC
HCT: 27.8 % — ABNORMAL LOW (ref 36.0–46.0)
Hemoglobin: 8.4 g/dL — ABNORMAL LOW (ref 12.0–15.0)
MCH: 29.5 pg (ref 26.0–34.0)
MCHC: 30.2 g/dL (ref 30.0–36.0)
MCV: 97.5 fL (ref 80.0–100.0)
Platelets: 219 10*3/uL (ref 150–400)
RBC: 2.85 MIL/uL — ABNORMAL LOW (ref 3.87–5.11)
RDW: 16.3 % — ABNORMAL HIGH (ref 11.5–15.5)
WBC: 14.9 10*3/uL — ABNORMAL HIGH (ref 4.0–10.5)
nRBC: 0.3 % — ABNORMAL HIGH (ref 0.0–0.2)

## 2020-10-11 LAB — MAGNESIUM: Magnesium: 2.4 mg/dL (ref 1.7–2.4)

## 2020-10-11 LAB — HEPARIN LEVEL (UNFRACTIONATED): Heparin Unfractionated: 0.46 IU/mL (ref 0.30–0.70)

## 2020-10-11 MED ORDER — INSULIN ASPART 100 UNIT/ML ~~LOC~~ SOLN
5.0000 [IU] | SUBCUTANEOUS | Status: DC
  Administered 2020-10-11 – 2020-10-17 (×35): 5 [IU] via SUBCUTANEOUS

## 2020-10-11 MED ORDER — ALBUMIN HUMAN 25 % IV SOLN
25.0000 g | Freq: Once | INTRAVENOUS | Status: AC
  Administered 2020-10-11: 16:00:00 25 g via INTRAVENOUS
  Filled 2020-10-11: qty 100

## 2020-10-11 MED ORDER — INSULIN ASPART 100 UNIT/ML ~~LOC~~ SOLN
0.0000 [IU] | SUBCUTANEOUS | Status: DC
  Administered 2020-10-11: 17:00:00 5 [IU] via SUBCUTANEOUS
  Administered 2020-10-11: 20:00:00 3 [IU] via SUBCUTANEOUS
  Administered 2020-10-11: 14:00:00 11 [IU] via SUBCUTANEOUS
  Administered 2020-10-12 (×2): 3 [IU] via SUBCUTANEOUS
  Administered 2020-10-12: 13:00:00 5 [IU] via SUBCUTANEOUS
  Administered 2020-10-12 – 2020-10-13 (×4): 2 [IU] via SUBCUTANEOUS
  Administered 2020-10-13: 12:00:00 3 [IU] via SUBCUTANEOUS
  Administered 2020-10-13: 2 [IU] via SUBCUTANEOUS
  Administered 2020-10-13 (×3): 3 [IU] via SUBCUTANEOUS
  Administered 2020-10-14: 12:00:00 5 [IU] via SUBCUTANEOUS
  Administered 2020-10-14: 04:00:00 2 [IU] via SUBCUTANEOUS
  Administered 2020-10-14: 15:00:00 3 [IU] via SUBCUTANEOUS
  Administered 2020-10-14: 08:00:00 2 [IU] via SUBCUTANEOUS
  Administered 2020-10-14 (×2): 5 [IU] via SUBCUTANEOUS
  Administered 2020-10-15: 08:00:00 8 [IU] via SUBCUTANEOUS
  Administered 2020-10-15 (×3): 5 [IU] via SUBCUTANEOUS
  Administered 2020-10-15 (×2): 8 [IU] via SUBCUTANEOUS
  Administered 2020-10-16: 20:00:00 5 [IU] via SUBCUTANEOUS
  Administered 2020-10-16: 03:00:00 11 [IU] via SUBCUTANEOUS
  Administered 2020-10-16: 12:00:00 8 [IU] via SUBCUTANEOUS
  Administered 2020-10-16: 17:00:00 5 [IU] via SUBCUTANEOUS
  Administered 2020-10-16: 8 [IU] via SUBCUTANEOUS
  Administered 2020-10-16 – 2020-10-17 (×3): 5 [IU] via SUBCUTANEOUS
  Administered 2020-10-17: 13:00:00 3 [IU] via SUBCUTANEOUS
  Administered 2020-10-17 (×2): 5 [IU] via SUBCUTANEOUS
  Administered 2020-10-17: 20:00:00 8 [IU] via SUBCUTANEOUS
  Administered 2020-10-18: 20:00:00 3 [IU] via SUBCUTANEOUS
  Administered 2020-10-18: 15:00:00 8 [IU] via SUBCUTANEOUS
  Administered 2020-10-18 (×2): 5 [IU] via SUBCUTANEOUS
  Administered 2020-10-19: 16:00:00 3 [IU] via SUBCUTANEOUS
  Administered 2020-10-19 (×2): 2 [IU] via SUBCUTANEOUS
  Administered 2020-10-19 – 2020-10-20 (×3): 3 [IU] via SUBCUTANEOUS
  Administered 2020-10-20 (×2): 5 [IU] via SUBCUTANEOUS
  Administered 2020-10-20 (×3): 3 [IU] via SUBCUTANEOUS
  Administered 2020-10-20: 08:00:00 2 [IU] via SUBCUTANEOUS
  Administered 2020-10-21: 08:00:00 3 [IU] via SUBCUTANEOUS
  Administered 2020-10-21: 20:00:00 8 [IU] via SUBCUTANEOUS
  Administered 2020-10-21: 16:00:00 11 [IU] via SUBCUTANEOUS
  Administered 2020-10-21: 12:00:00 3 [IU] via SUBCUTANEOUS
  Administered 2020-10-21 (×2): 5 [IU] via SUBCUTANEOUS
  Administered 2020-10-22: 04:00:00 3 [IU] via SUBCUTANEOUS
  Administered 2020-10-22: 8 [IU] via SUBCUTANEOUS
  Administered 2020-10-22: 08:00:00 3 [IU] via SUBCUTANEOUS
  Administered 2020-10-22: 16:00:00 2 [IU] via SUBCUTANEOUS
  Administered 2020-10-22 (×2): 5 [IU] via SUBCUTANEOUS
  Administered 2020-10-23 (×2): 3 [IU] via SUBCUTANEOUS
  Administered 2020-10-23: 20:00:00 5 [IU] via SUBCUTANEOUS
  Administered 2020-10-23 – 2020-10-24 (×4): 2 [IU] via SUBCUTANEOUS
  Administered 2020-10-24 – 2020-10-25 (×3): 3 [IU] via SUBCUTANEOUS
  Administered 2020-10-25: 16:00:00 5 [IU] via SUBCUTANEOUS
  Administered 2020-10-25 (×3): 3 [IU] via SUBCUTANEOUS
  Administered 2020-10-26: 20:00:00 5 [IU] via SUBCUTANEOUS
  Administered 2020-10-26: 08:00:00 3 [IU] via SUBCUTANEOUS
  Administered 2020-10-26: 12:00:00 5 [IU] via SUBCUTANEOUS
  Administered 2020-10-26 – 2020-10-27 (×2): 3 [IU] via SUBCUTANEOUS
  Administered 2020-10-27: 04:00:00 2 [IU] via SUBCUTANEOUS
  Administered 2020-10-28: 12:00:00 3 [IU] via SUBCUTANEOUS
  Administered 2020-10-28: 04:00:00 5 [IU] via SUBCUTANEOUS
  Administered 2020-10-28 (×2): 3 [IU] via SUBCUTANEOUS
  Administered 2020-10-28: 19:00:00 5 [IU] via SUBCUTANEOUS
  Administered 2020-10-28: 16:00:00 2 [IU] via SUBCUTANEOUS
  Administered 2020-10-29 (×2): 3 [IU] via SUBCUTANEOUS
  Administered 2020-10-29 (×4): 5 [IU] via SUBCUTANEOUS
  Administered 2020-10-29: 3 [IU] via SUBCUTANEOUS
  Administered 2020-10-30: 20:00:00 5 [IU] via SUBCUTANEOUS
  Administered 2020-10-30: 12:00:00 3 [IU] via SUBCUTANEOUS
  Administered 2020-10-30 (×2): 5 [IU] via SUBCUTANEOUS
  Administered 2020-10-30 (×2): 3 [IU] via SUBCUTANEOUS
  Administered 2020-10-31: 8 [IU] via SUBCUTANEOUS
  Administered 2020-10-31: 2 [IU] via SUBCUTANEOUS
  Administered 2020-10-31: 5 [IU] via SUBCUTANEOUS
  Administered 2020-10-31: 04:00:00 3 [IU] via SUBCUTANEOUS
  Administered 2020-10-31: 13:00:00 11 [IU] via SUBCUTANEOUS
  Administered 2020-10-31: 3 [IU] via SUBCUTANEOUS
  Administered 2020-11-01: 16:00:00 8 [IU] via SUBCUTANEOUS
  Administered 2020-11-01: 08:00:00 2 [IU] via SUBCUTANEOUS
  Administered 2020-11-01: 13:00:00 3 [IU] via SUBCUTANEOUS
  Administered 2020-11-01: 5 [IU] via SUBCUTANEOUS
  Administered 2020-11-01 (×2): 8 [IU] via SUBCUTANEOUS
  Administered 2020-11-02 (×2): 5 [IU] via SUBCUTANEOUS
  Administered 2020-11-02: 04:00:00 8 [IU] via SUBCUTANEOUS
  Administered 2020-11-02 (×2): 5 [IU] via SUBCUTANEOUS
  Administered 2020-11-02 – 2020-11-03 (×2): 3 [IU] via SUBCUTANEOUS
  Administered 2020-11-03: 2 [IU] via SUBCUTANEOUS
  Administered 2020-11-03 (×3): 3 [IU] via SUBCUTANEOUS
  Administered 2020-11-03: 04:00:00 5 [IU] via SUBCUTANEOUS
  Administered 2020-11-04: 3 [IU] via SUBCUTANEOUS
  Administered 2020-11-05: 2 [IU] via SUBCUTANEOUS
  Administered 2020-11-05 (×3): 3 [IU] via SUBCUTANEOUS
  Administered 2020-11-06: 04:00:00 5 [IU] via SUBCUTANEOUS
  Administered 2020-11-06: 3 [IU] via SUBCUTANEOUS
  Administered 2020-11-06: 09:00:00 2 [IU] via SUBCUTANEOUS
  Administered 2020-11-06 – 2020-11-07 (×5): 3 [IU] via SUBCUTANEOUS
  Administered 2020-11-07: 20:00:00 2 [IU] via SUBCUTANEOUS
  Administered 2020-11-07 (×2): 3 [IU] via SUBCUTANEOUS
  Administered 2020-11-08: 08:00:00 2 [IU] via SUBCUTANEOUS
  Administered 2020-11-08: 3 [IU] via SUBCUTANEOUS
  Administered 2020-11-08: 12:00:00 2 [IU] via SUBCUTANEOUS
  Administered 2020-11-10 (×2): 3 [IU] via SUBCUTANEOUS
  Administered 2020-11-10: 2 [IU] via SUBCUTANEOUS
  Administered 2020-11-10 (×3): 3 [IU] via SUBCUTANEOUS
  Administered 2020-11-11 (×2): 5 [IU] via SUBCUTANEOUS
  Administered 2020-11-11 – 2020-11-15 (×6): 2 [IU] via SUBCUTANEOUS
  Administered 2020-11-16 (×3): 3 [IU] via SUBCUTANEOUS
  Administered 2020-11-17 (×2): 2 [IU] via SUBCUTANEOUS

## 2020-10-11 NOTE — Progress Notes (Signed)
[place] for heparin Indication: atrial fibrillation  Assessment: 69 y.o. female with a history of atrial fibrillation on chronic Eliquis prior to admission which has been held and patient transitioned to IV heparin infusion per pharmacy dosing consult.   Heparin level remains therapeutic at 0.46.  Hgb/Hct remains low but stable and no overt bleeding noted.  Platelets are improved at 219 today - within normal limits.   Goal of Therapy:  Heparin level 0.3-0.5 units/ml Monitor platelets by anticoagulation protocol: Yes   Plan:  Continue heparin at 1350 units/hr (13.5 mL/hr) Monitor daily heparin level and CBC while on therapy  Thanks for allowing pharmacy to be a part of this patient's care.   Sloan Leiter, PharmD, BCPS, BCCCP Clinical Pharmacist Please refer to [facility] for [place] numbers 10/11/2020, 7:35 AM  .

## 2020-10-11 NOTE — Progress Notes (Signed)
Inpatient Diabetes Program Recommendations  AACE/ADA: New Consensus Statement on Inpatient Glycemic Control (2015)  Target Ranges:  Prepandial:   less than 140 mg/dL      Peak postprandial:   less than 180 mg/dL (1-2 hours)      Critically ill patients:  140 - 180 mg/dL   Lab Results  Component Value Date   GLUCAP 347 (H) 10/11/2020   HGBA1C 7.0 (H) 08/24/2020    Review of Glycemic Control  Elevated heart rate, recent covid PNA, colon ca, CKD3 AKI - hold metformin   Diabetes history: DM2 Outpatient Diabetes medications: metformin 500 mg bid Current orders for Inpatient glycemic control: Lantus 25 units QD, Novolog 0-15 units Q4H + 5 units Q4H  HgbA1C - 7%  Inpatient Diabetes Program Recommendations:     Agree with orders. Added Novolog 0-15 units Q4H + 5 units tidwc. Continue with Lantus 25 units QD.  Will follow glycemic control trends.   Thank you. Lorenda Peck, RD, LDN, CDE Inpatient Diabetes Coordinator [phone]

## 2020-10-11 NOTE — Progress Notes (Signed)
NAMEOmaya [name], MRN:  [id number], DOB:  April 08, 1951, LOS: 43 ADMISSION DATE:  08/31/2020, CONSULTATION DATE:  09/07/2020 REFERRING MD:  Dr Candace [name], CHIEF COMPLAINT:  Acute resp failure  Brief History   69 year old female who was previously diagnosed with Covid 08/23/2020.  Admitted 11/2 with AF-RVR, found to have a perforated duodenal underwent exploratory laparotomy with Phillip Heal patch placement.  Past Medical History  Covid pneumonia Atrial fibrillation CKD stage III Diabetes mellitus Hypertension Colon cancer Hyperlipidemia  Significant Hospital Events   11/2 Admitted  11/3 OR -> perforated duodenal ulcer 11/10 progressive hemorrhagic shock, intubated, transfused, pressors, proned; started on CRRT in PM 11/16 Extubated. Re-intubated overnight due to respiratory distress and hypoxia with decreased mentation 11/18  bronch'd/ cultures sent 11/19 hgb down getting blood 09/22/2020 spiked fever resume empirical antimicrobial therapy 11/26: hemorrhagic shock, hgb 5.6, increased pressors, CT A/P  11/30 per palliative "Candace [name] expresses understanding that patient is unfortunately very fragile despite ongoing intensive medical treatment and full mechanical support. She indicates that the family wants to continue with all current interventions despite potential outcomes". 12/8: CRRT discontinued due to clotting 12/9 family requested transfer to tertiary care [facility]). They did not feel that they could offer further treatment and denied transfer  12/10-12/12: CRRT restarted. Continues to have episodes of tachycardia, tachypnea that seem to improve with pain management 12/11 back in shock. Pressor requirements  Up. CXR worse. ABX resumed 12/12 still requiring inc pressors. Had hypoglycemic event. Basal insulin dosing adjusted 12/13 pressor requirements better. Now hyperglycemic. Re-adjusted Glycemic control   Consults:  Cardiology [facility] Nephrology   Procedures:  11/3 Exploratory laparotomy,  Phillip Heal patch, lysis of adhesion for duodenal ulceration postop day 6  R PICC 11/5 >> A line 11/9 >> out ETT 11/9 > 11/16, 11/16 >> 09/21/2020 09/21/2020 tracheostomy>> Lt  CVL 11/9 >> R IJ trialysis >> out HD catheter 12/1 >>   Significant Diagnostic Tests:  11/3 CT abd/ pelvis > 1. Positive for bowel perforation: Pneumoperitoneum and intermediate density free fluid in the abdomen. Prior total colectomy. The specific site of perforation is unclear-oral contrast present to the proximal jejunum has not obviously leaked. Note that there may be small bowel loops adherent to the ventral abdominal wall along the greater curve of the stomach. 2. Extensive bilateral lower lung pneumonia. No pleural effusion. 3. Other abdominal and pelvic viscera are stable since 2015, including bilateral adrenal adenomas. Chronic renal parapelvic cysts. 4. Aortic Atherosclerosis 11/3 TTE > EF 70-75%, RV not well visualized, mildly reduced RV systolic function 64/33 CT chest/ abd/ pelvis> 1. Interval progression of diffuse bilateral hazy ground-glass airspace opacities with more focal areas of consolidation at the lung bases 2. Trace bilateral pleural effusions. 3. Postsurgical changes the abdomen as detailed above. No evidence for a postoperative abscess, however evaluation is limited by lack of IV contrast. 4. There is a 1.9 cm cystic appearing lesion located in the pancreatic body. This was not present on the patient's CT from 2015.  Follow-up with an outpatient contrast enhanced MRI is recommended. 5. The endometrial stripe appears diffusely thickened. Follow-up with pelvic ultrasound is recommended. Aortic Atherosclerosis 11/14 LE doppler studies > + DVT of right posterior tibial and peroneal vein, +dVT of left posterior tibial vein  11/26 CT ABD PEL > liver unremarkable, distended gallbladder with layering tiny gallstones versus sludge, no duct dilatation, mild hyperdensity right upper pole renal collecting  system new.  No evidence of retroperitoneal bleeding.  Multifocal lower lobe predominant pulmonary infiltrates/pneumonia.  Small left  pleural effusion.  Micro Data:  11/10 MRSA PCR > neg 11/10 BC x 2 > neg 09/22/2020 blood cultures x2>> S epi.  09/22/2020 sputum culture>> MRSA Blood 12/1 >> negative. S.epi 1 out of 2, likely contaminant Respiratory culture 12/13>>>  Antimicrobials:  azithro 11/2 >11/3 Ceftriaxone 11/2  Fluconazole 11/3 Zosyn 11/3 >> 11/7 Vanc 11/10 off Cefepime 11/10 > 11/16 09/22/2020 vancomycin for MRSA PNA >> 12/2  Vanc 12/11> Zosyn 12/11>  Subjective:  BP better. Weaning pressors again. Now Hyperglycemic   Objective   Blood pressure (Abnormal) 146/76, pulse 89, temperature 97.6 F (36.4 C), temperature source Axillary, resp. rate (Abnormal) 28, height 5\' 8"  (1.727 m), weight 92.2 kg, SpO2 100 %.    Vent Mode: PRVC FiO2 (%):  [80 %-100 %] 80 % Set Rate:  [28 bmp] 28 bmp Vt Set:  [450 mL-460 mL] 450 mL PEEP:  [12 cmH20] 12 cmH20 Plateau Pressure:  [30 cmH20-40 cmH20] 30 cmH20   Intake/Output Summary (Last 24 hours) at 10/11/2020 0926 Last data filed at 10/11/2020 0900 Gross per 24 hour  Intake 3476.76 ml  Output 4076 ml  Net -599.24 ml   Filed Weights   10/09/20 0500 10/10/20 0500 10/11/20 0356  Weight: 88.5 kg 88.5 kg 92.2 kg   Physical Exam:  General this is 69 year old female who remains vent, pressor and sedation dependant HENT trach site unremarkable PERRL Pulm diffuse rhonchi. PPlat 34 still high FIO2/peep Card RRR abd soft. FT in place. Mid abd incision well granulated. Stool from ostomy Ext warm pulses are palp. BUE edema Neuro eyes open. Intermittently tracts. No movement of extremities.  GU anuric    Previously hemorrhagic shock requiring PRBCs, Septic shock, MRSA pneumonia s/p 8 days abx Assessment & Plan:   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, status post  tracheostomy Complicated by healthcare associated pneumonia due to MRSA Plan Cont full vent support VAP bundle Repeat CXR am PAD protocol RASS goal -3  Acute metabolic encephalopathy in the setting of renal failure, shock, sepsis Plan No change in Dilaudid /precedex/ oxy seroquel and clonaz Supportive care   Recurrent septic shock 12/11-->working dx recurrent HCAP Pressor requirements better Tolerating volume removal  Plan Day 3/x zosyn and vanc Repeat sputum   AKI, oliguric/anuric --Nephrology consulted. Deemed NOT long term HD candidate.  --CRRT resumed on 12/10 Plan Cont CRRT  Serial chemistries Strict I&O Am chemistry   Duodenal ulcer perforation status post ex lap and Graham's patch --Appreciate CTS assistance and management Plan Would need PEG eventually but given recent duodenal patch can't get gastric insufflation  Cont wound care   Paroxysmal A. fib, currently in sinus rhythm --Patient is in sinus rhythm now Plan Cont tele and heparin   Diabetes type 2 w/ marked Hyperglycemia. On 12/12 was hypoglycemia and got D50; Now over compensated  Plan Change TF to mod and add TF coverage at 5 Units q 4 Cont current lantus    Indeterminate bilateral DVT Plan Cont IV heparin   History of stage IV colon cancer Status post pulmonary [facility]ctom[facility] + Chemo 1.9 cm cystic appearing lesion located in the pancreatic body Plan Can follow at [facility] if survives    Best practice:  Diet: Tube feeds Pain/Anxiety/Delirium protocol (if indicated) Precedex, fentanyl, oxycodone per tube, clonazepam VAP protocol (if indicated): yes DVT prophylaxis: IV heparin GI prophylaxis: Protonix Glucose control: lantus + ssi  Mobility: Bedrest Code Status: Full Goals of care discussion   12/9: Spoke with  patient's daughter at bedside at length.  Discussed severity of illness and likely overall worsening despite max efforts.  She and her sister want to continue aggressive  support.  RN and RT involved in discussion as well.   12/12: Daughter confirmed aggressive measures  Next multidisciplinary goals of care discussion 12/14 Disposition: ICU  My cct 34 min  Erick Colace ACNP-BC [place] Pager # [phone] OR # [phone] if no answer

## 2020-10-11 NOTE — Progress Notes (Signed)
PT Cancellation Note  Patient Details Name: Harlea Goetzinger MRN: [id number] DOB: 29-Mar-1951   Cancelled Treatment:    Reason Eval/Treat Not Completed: Medical issues which prohibited therapy Pt remains unstable for PT for >1week.  She is currently on vent setting of 80% FiO2 outside of PT parameters for therapy.  Will check on pt weekly to assess for improvement.  If PT needed prior to next week, please reconsult.   Abran Richard, PT Acute Rehab Services Pager [phone] [facility] [place] 10/11/2020, 10:40 AM

## 2020-10-11 NOTE — Progress Notes (Signed)
Patient ID: Candace [name], female   DOB: [birth year], 69 y.o.   MRN: [id number]  [facility] Progress Note   Assessment/ Plan:   1. Acute kidney Injury: This appears to be ATN associated with multifactorial shock-septic shock from MRSA pneumonia as well as hemorrhagic shock.  Continue CRRT at this time for lack of renal recovery/multiple metabolic abnormalities including pressor dependent hypotension.  She did not tolerate IHD attempt.  We will continue CRRT at this time recognizing that she remains a long way from any position at outpatient hemodialysis/placement.  Maintain CRRT at net -50 cc an hour and will give IV albumin to help improve oncotic status. 2.  Septic shock secondary to MRSA pneumonia: Status post completion of intravenous vancomycin and ongoing support with pressors and midodrine. 3.  Perforated duodenal ulcer with acute blood loss anemia: Status post exploratory laparotomy and Graham patch placement for definitive management. 4.  Acute hypoxic/hypercapnic respiratory failure: Secondary to ARDS from COVID-19 infection 5.  Severe protein calorie malnutrition  Subjective:   Decreasing pressor requirements noted today after transient higher dose required yesterday.   Objective:   BP 115/68   Pulse 99   Temp 97.6 F (36.4 C) (Axillary)   Resp (!) 28   Ht _0  (1.727 m)   Wt 92.2 kg   SpO2 100%   BMI 30.91 kg/m   Intake/Output Summary (Last 24 hours) at 10/11/2020 1433 Last data filed at 10/11/2020 1400 Gross per 24 hour  Intake 3587.13 ml  Output 4656 ml  Net -1068.87 ml   Weight change: 3.7 kg  Physical Exam: Gen: Intubated, sedated and on CRRT CVS: Pulse irregular tachycardia, S1 and S2 normal. right subclavian hemodialysis catheter. Resp: Coarse breath sounds bilaterally without distinct rales or rhonchi Abd: Soft, flat, bowel sounds normal Ext: 2+ anasarca.  Imaging: No results found.  Labs: BMET Recent Labs  Lab 10/08/20 0321  10/08/20 1557 10/08/20 1825 10/09/20 0417 10/09/20 1611 10/10/20 0500 10/10/20 1624 10/11/20 0348  NA 133* 135 135 134* 135 135 133* 131*  K 4.3 3.5 3.6 3.5 3.5 3.7 5.1 4.8  CL 102 103  --  101 102 101 99 99  CO2 22 22  --  _1 GLUCOSE 227* 107*  --  190* 151* 113* 265* 381*  BUN 59* 45*  --  36* 28* 25* 24* 24*  CREATININE 0.80 0.65  --  0.55 0.50 0.44 0.52 0.46  CALCIUM 8.3* 8.4*  --  8.6* 9.0 9.0 8.5* 8.8*  PHOS 5.0* 4.7*  --  3.2 2.9 2.7 3.6 3.5   CBC Recent Labs  Lab 10/08/20 0322 10/08/20 1825 10/09/20 0417 10/10/20 0500 10/11/20 0348  WBC 11.7*  --  10.8* 10.3 14.9*  HGB 8.5* 9.2* 8.7* 8.8* 8.4*  HCT 26.9* 27.0* 28.6* 27.0* 27.8*  MCV 95.7  --  97.3 93.1 97.5  PLT 149*  --  153 171 219    Medications:    . B-complex with vitamin C  1 tablet Per Tube Daily  . chlorhexidine gluconate (MEDLINE KIT)  15 mL Mouth Rinse BID  . Chlorhexidine Gluconate Cloth  6 each Topical Daily  . clonazepam  1 mg Per Tube BID  . collagenase   Topical Daily  . darbepoetin (ARANESP) injection - DIALYSIS  60 mcg Subcutaneous Q Tue-1800  . docusate  100 mg Per Tube BID  . feeding supplement (PROSource TF)  90 mL Per Tube TID  . insulin aspart  0-15 Units Subcutaneous  Q4H  . insulin aspart  5 Units Subcutaneous Q4H  . insulin glargine  25 Units Subcutaneous Daily  . mouth rinse  15 mL Mouth Rinse QID  . midazolam  1 mg Intravenous Once  . midodrine  20 mg Per Tube TID WC  . oxyCODONE  5 mg Per Tube Q8H  . pantoprazole sodium  40 mg Per Tube BID  . polyethylene glycol  17 g Per Tube Daily  . QUEtiapine  100 mg Per Tube BID  . sodium chloride flush  10-40 mL Intracatheter Q12H   Elmarie Shiley, MD 10/11/2020, 2:33 PM

## 2020-10-12 ENCOUNTER — Inpatient Hospital Stay (HOSPITAL_COMMUNITY): Payer: Medicare Other

## 2020-10-12 DIAGNOSIS — R579 Shock, unspecified: Secondary | ICD-10-CM

## 2020-10-12 LAB — CBC
HCT: 25.3 % — ABNORMAL LOW (ref 36.0–46.0)
Hemoglobin: 7.8 g/dL — ABNORMAL LOW (ref 12.0–15.0)
MCH: 30.2 pg (ref 26.0–34.0)
MCHC: 30.8 g/dL (ref 30.0–36.0)
MCV: 98.1 fL (ref 80.0–100.0)
Platelets: 232 10*3/uL (ref 150–400)
RBC: 2.58 MIL/uL — ABNORMAL LOW (ref 3.87–5.11)
RDW: 16.2 % — ABNORMAL HIGH (ref 11.5–15.5)
WBC: 12.7 10*3/uL — ABNORMAL HIGH (ref 4.0–10.5)
nRBC: 0.9 % — ABNORMAL HIGH (ref 0.0–0.2)

## 2020-10-12 LAB — RENAL FUNCTION PANEL
Albumin: 1.8 g/dL — ABNORMAL LOW (ref 3.5–5.0)
Albumin: 2 g/dL — ABNORMAL LOW (ref 3.5–5.0)
Anion gap: 10 (ref 5–15)
Anion gap: 8 (ref 5–15)
BUN: 20 mg/dL (ref 8–23)
BUN: 21 mg/dL (ref 8–23)
CO2: 23 mmol/L (ref 22–32)
CO2: 25 mmol/L (ref 22–32)
Calcium: 9.6 mg/dL (ref 8.9–10.3)
Calcium: 10.2 mg/dL (ref 8.9–10.3)
Chloride: 100 mmol/L (ref 98–111)
Chloride: 99 mmol/L (ref 98–111)
Creatinine, Ser: 0.38 mg/dL — ABNORMAL LOW (ref 0.44–1.00)
Creatinine, Ser: 0.5 mg/dL (ref 0.44–1.00)
GFR, Estimated: >60 mL/min (ref >60)
GFR, Estimated: >60 mL/min (ref >60)
Glucose, Bld: 135 mg/dL — ABNORMAL HIGH (ref 70–99)
Glucose, Bld: 192 mg/dL — ABNORMAL HIGH (ref 70–99)
Phosphorus: 3.3 mg/dL (ref 2.5–4.6)
Phosphorus: 3.3 mg/dL (ref 2.5–4.6)
Potassium: 4.7 mmol/L (ref 3.5–5.1)
Potassium: 4.8 mmol/L (ref 3.5–5.1)
Sodium: 133 mmol/L — ABNORMAL LOW (ref 135–145)
Sodium: 132 mmol/L — ABNORMAL LOW (ref 135–145)

## 2020-10-12 LAB — MAGNESIUM: Magnesium: 2.5 mg/dL — ABNORMAL HIGH (ref 1.7–2.4)

## 2020-10-12 LAB — GLUCOSE, CAPILLARY
Glucose-Capillary: 132 mg/dL — ABNORMAL HIGH (ref 70–99)
Glucose-Capillary: 142 mg/dL — ABNORMAL HIGH (ref 70–99)
Glucose-Capillary: 153 mg/dL — ABNORMAL HIGH (ref 70–99)
Glucose-Capillary: 172 mg/dL — ABNORMAL HIGH (ref 70–99)
Glucose-Capillary: 212 mg/dL — ABNORMAL HIGH (ref 70–99)
Glucose-Capillary: 90 mg/dL (ref 70–99)

## 2020-10-12 LAB — HEPARIN LEVEL (UNFRACTIONATED): Heparin Unfractionated: 0.44 IU/mL (ref 0.30–0.70)

## 2020-10-12 MED ORDER — ALBUMIN HUMAN 25 % IV SOLN
25.0000 g | Freq: Once | INTRAVENOUS | Status: AC
  Administered 2020-10-12: 10:00:00 25 g via INTRAVENOUS
  Filled 2020-10-12: qty 100

## 2020-10-12 NOTE — Progress Notes (Signed)
OT Cancellation Note  Patient Details Name: Candace [name] MRN: [id number] DOB: 08-04-1951   Cancelled Treatment:    Reason Eval/Treat Not Completed: Patient not medically ready. Pt remains on pressors, sedation and vent with peep 12; FiO2 70%. OT signing off at this time. MD notified. Please reorder when pt able to participate with OT. Thanks.  Ramond Dial, OT/L   Acute OT Clinical Specialist Acute Rehabilitation Services Pager [phone] Office [phone]  10/12/2020, 10:16 AM

## 2020-10-12 NOTE — Progress Notes (Signed)
Patient ID: Candace [name], female   DOB: Aug 17, 1951, 69 y.o.   MRN: [id number]  [facility] Progress Note   Assessment/ Plan:   1. Acute kidney Injury: This appears to be ATN associated with multifactorial shock-septic shock from MRSA pneumonia as well as hemorrhagic shock.  Continue CRRT at this time for lack of renal recovery/multiple metabolic abnormalities including pressor dependent hypotension.  Will decrease rate of dialysate flow and give additional dose of albumin bolus to try and improve oncotic pressure/hemodynamic status as we worked on on pressors.  Unable to transition to IHD. 2.  Septic shock secondary to MRSA pneumonia: Status post completion of intravenous vancomycin and ongoing support with pressors and midodrine. 3.  Perforated duodenal ulcer with acute blood loss anemia: Status post exploratory laparotomy and Graham patch placement for definitive management. 4.  Acute hypoxic/hypercapnic respiratory failure: Secondary to ARDS from COVID-19 infection 5.  Severe protein calorie malnutrition  Subjective:   Possibly slight improvement in mental status with continued decreasing pressor requirements.   Objective:   BP 120/63   Pulse (!) 104   Temp 98 F (36.7 C) (Axillary)   Resp (!) 23   Ht 5' 8"  (1.727 m)   Wt 85.5 kg   SpO2 100%   BMI 28.66 kg/m   Intake/Output Summary (Last 24 hours) at 10/12/2020 0848 Last data filed at 10/12/2020 0800 Gross per 24 hour  Intake 3292.57 ml  Output 4922 ml  Net -1629.43 ml   Weight change: -6.7 kg  Physical Exam: Gen: Awake and appears comfortable intubated on CRRT CVS: Pulse irregular tachycardia, S1 and S2 normal. right subclavian hemodialysis catheter. Resp: Coarse breath sounds bilaterally without distinct rales or rhonchi Abd: Soft, flat, bowel sounds normal Ext: 2+ anasarca.  Imaging: DG Chest Port 1 View  Result Date: 10/12/2020 CLINICAL DATA:  ARDS. EXAM: PORTABLE CHEST 1 VIEW COMPARISON:   10/08/2020. FINDINGS: Tracheostomy tube, feeding tube, right PICC line, right subclavian line in stable position. Heart size normal. Bilateral pulmonary infiltrates/edema again noted without interim change. Small bilateral pleural effusions again noted without interim change. No pneumothorax. IMPRESSION: 1. Lines and tubes in stable position. 2. Bilateral pulmonary infiltrates/edema and small bilateral pleural effusions again noted without interim change. Electronically Signed   By: Marcello Moores  Register   On: 10/12/2020 05:25    Labs: BMET Recent Labs  Lab 10/09/20 0417 10/09/20 1611 10/10/20 0500 10/10/20 1624 10/11/20 0348 10/11/20 1700 10/12/20 0338  NA 134* 135 135 133* 131* 135 133*  K 3.5 3.5 3.7 5.1 4.8 4.5 4.7  CL 101 102 101 99 99 100 100  CO2 24 25 26 24 23 25 23   GLUCOSE 190* 151* 113* 265* 381* 258* 135*  BUN 36* 28* 25* 24* 24* 21 20  CREATININE 0.55 0.50 0.44 0.52 0.46 0.48 0.38*  CALCIUM 8.6* 9.0 9.0 8.5* 8.8* 9.5 9.6  PHOS 3.2 2.9 2.7 3.6 3.5 3.4 3.3   CBC Recent Labs  Lab 10/09/20 0417 10/10/20 0500 10/11/20 0348 10/12/20 0338  WBC 10.8* 10.3 14.9* 12.7*  HGB 8.7* 8.8* 8.4* 7.8*  HCT 28.6* 27.0* 27.8* 25.3*  MCV 97.3 93.1 97.5 98.1  PLT 153 171 219 232    Medications:    . B-complex with vitamin C  1 tablet Per Tube Daily  . chlorhexidine gluconate (MEDLINE KIT)  15 mL Mouth Rinse BID  . Chlorhexidine Gluconate Cloth  6 each Topical Daily  . clonazepam  1 mg Per Tube BID  . collagenase   Topical Daily  .  darbepoetin (ARANESP) injection - DIALYSIS  60 mcg Subcutaneous Q Tue-1800  . docusate  100 mg Per Tube BID  . feeding supplement (PROSource TF)  90 mL Per Tube TID  . insulin aspart  0-15 Units Subcutaneous Q4H  . insulin aspart  5 Units Subcutaneous Q4H  . insulin glargine  25 Units Subcutaneous Daily  . mouth rinse  15 mL Mouth Rinse QID  . midazolam  1 mg Intravenous Once  . midodrine  20 mg Per Tube TID WC  . oxyCODONE  5 mg Per Tube Q8H  .  pantoprazole sodium  40 mg Per Tube BID  . polyethylene glycol  17 g Per Tube Daily  . QUEtiapine  100 mg Per Tube BID  . sodium chloride flush  10-40 mL Intracatheter Q12H   Elmarie Shiley, MD 10/12/2020, 8:48 AM

## 2020-10-12 NOTE — Progress Notes (Signed)
Multidisciplinary goals of care discussion  People present  ACNP (critical Care) Bedside RN Patient's daughter.  Candace [name].   We discussed the patient's status, diagnosis's including: respiratory failure, vent dependence, Pneumonia and acute renal failure. We discussed current challenges including: weaning from pressors, weaning vent support, current need for CRRT and if the patient can tolerate iHD. Explained to Candace [name] that the need for iHD in the outpt setting and also need for trach is challenging as typically local iHD clinic will not take tracheostomy patient's.  She remains stead-fast in her goal to continue aggressive care and remains hopeful that [name] will recover. Never really got a opportunity to discuss the "what ifs" as daughter would state "I've discussed this with many other doctors and nurses" and was not really open to further discussion re: this topic.   Plan Continue all aggressive care Including mechanical ventilation Vasoactive gtts HD and CRRT  If we can get her off pressors and she can tolerate iHD she may perhaps be a candidate for LTAC to see if she can come off vent. I am not sure that this is a realistic goal based on where she is this far out but will continue w/ current plan of care as outlined.   Next goals of care meeting due: 12/21  30 minutes.   Erick Colace ACNP-BC [place] Pager # [phone] OR # [phone] if no answer

## 2020-10-12 NOTE — Progress Notes (Signed)
NAMECalley [name], MRN:  [id number], DOB:  07-19-1951, LOS: 65 ADMISSION DATE:  08/31/2020, CONSULTATION DATE:  09/07/2020 REFERRING MD:  Dr Candace [name], CHIEF COMPLAINT:  Acute resp failure  Brief History   69 year old female who was previously diagnosed with Covid 08/23/2020.  Admitted 11/2 with AF-RVR, found to have a perforated duodenal underwent exploratory laparotomy with Candace [name] patch placement.  Past Medical History  Covid pneumonia Atrial fibrillation CKD stage III Diabetes mellitus Hypertension Colon cancer Hyperlipidemia  Significant Hospital Events   11/2 Admitted  11/3 OR -> perforated duodenal ulcer 11/10 progressive hemorrhagic shock, intubated, transfused, pressors, proned; started on CRRT in PM 11/16 Extubated. Re-intubated overnight due to respiratory distress and hypoxia with decreased mentation 11/18  bronch'd/ cultures sent 11/19 hgb down getting blood 09/22/2020 spiked fever resume empirical antimicrobial therapy 11/26: hemorrhagic shock, hgb 5.6, increased pressors, CT A/P  11/30 per palliative "Candace [name] expresses understanding that patient is unfortunately very fragile despite ongoing intensive medical treatment and full mechanical support. She indicates that the family wants to continue with all current interventions despite potential outcomes". 12/8: CRRT discontinued due to clotting 12/9 family requested transfer to tertiary care [facility]). They did not feel that they could offer further treatment and denied transfer  12/10-12/12: CRRT restarted. Continues to have episodes of tachycardia, tachypnea that seem to improve with pain management 12/11 back in shock. Pressor requirements  Up. CXR worse. ABX resumed 12/12 still requiring inc pressors. Had hypoglycemic event. Basal insulin dosing adjusted 12/13 pressor requirements better. Now hyperglycemic. Re-adjusted Glycemic control  12/14 changed dilaudid to 1/2 dosing from day further. Dc vasopressin.  Consults:   Cardiology [facility] Nephrology   Procedures:  11/3 Exploratory laparotomy, Candace [name] patch, lysis of adhesion for duodenal ulceration postop day 6  R PICC 11/5 >> A line 11/9 >> out ETT 11/9 > 11/16, 11/16 >> 09/21/2020 09/21/2020 tracheostomy>> Lt [place] CVL 11/9 >> R IJ trialysis >> out HD catheter 12/1 >>   Significant Diagnostic Tests:  11/3 CT abd/ pelvis > 1. Positive for bowel perforation: Pneumoperitoneum and intermediate density free fluid in the abdomen. Prior total colectomy. The specific site of perforation is unclear-oral contrast present to the proximal jejunum has not obviously leaked. Note that there may be small bowel loops adherent to the ventral abdominal wall along the greater curve of the stomach. 2. Extensive bilateral lower lung pneumonia. No pleural effusion. 3. Other abdominal and pelvic viscera are stable since 2015, including bilateral adrenal adenomas. Chronic renal parapelvic cysts. 4. Aortic Atherosclerosis 11/3 TTE > EF 70-75%, RV not well visualized, mildly reduced RV systolic function 10/17 CT chest/ abd/ pelvis> 1. Interval progression of diffuse bilateral hazy ground-glass airspace opacities with more focal areas of consolidation at the lung bases 2. Trace bilateral pleural effusions. 3. Postsurgical changes the abdomen as detailed above. No evidence for a postoperative abscess, however evaluation is limited by lack of IV contrast. 4. There is a 1.9 cm cystic appearing lesion located in the pancreatic body. This was not present on the patient's CT from 2015.  Follow-up with an outpatient contrast enhanced MRI is recommended. 5. The endometrial stripe appears diffusely thickened. Follow-up with pelvic ultrasound is recommended. Aortic Atherosclerosis 11/14 LE doppler studies > + DVT of right posterior tibial and peroneal vein, +dVT of left posterior tibial vein  11/26 CT ABD PEL > liver unremarkable, distended gallbladder with layering tiny gallstones versus  sludge, no duct dilatation, mild hyperdensity right upper pole renal collecting system new.  No evidence of retroperitoneal  bleeding.  Multifocal lower lobe predominant pulmonary infiltrates/pneumonia.  Small left pleural effusion.  Micro Data:  11/10 MRSA PCR > neg 11/10 BC x 2 > neg 09/22/2020 blood cultures x2>> S epi.  09/22/2020 sputum culture>> MRSA Blood 12/1 >> negative. S.epi 1 out of 2, likely contaminant Respiratory culture 12/13>>>  Antimicrobials:  azithro 11/2 >11/3 Ceftriaxone 11/2  Fluconazole 11/3 Zosyn 11/3 >> 11/7 Vanc 11/10 off Cefepime 11/10 > 11/16 09/22/2020 vancomycin for MRSA PNA >> 12/2  Vanc 12/11> Zosyn 12/11>  Subjective:  BP requirements down   Objective   Blood pressure 120/63, pulse (Abnormal) 104, temperature 98 F (36.7 C), temperature source Axillary, resp. rate (Abnormal) 23, height 5\' 8"  (1.727 m), weight 85.5 kg, SpO2 100 %.    Vent Mode: PRVC FiO2 (%):  [60 %-70 %] 60 % Set Rate:  [28 bmp] 28 bmp Vt Set:  [450 mL] 450 mL PEEP:  [12 cmH20] 12 cmH20 Plateau Pressure:  [28 cmH20-32 cmH20] 32 cmH20   Intake/Output Summary (Last 24 hours) at 10/12/2020 0851 Last data filed at 10/12/2020 0800 Gross per 24 hour  Intake 3292.57 ml  Output 4922 ml  Net -1629.43 ml   Filed Weights   10/10/20 0500 10/11/20 0356 10/12/20 0420  Weight: 88.5 kg 92.2 kg 85.5 kg   Physical Exam:  General this is a 69 year old female. She remains CRRT and vent dependent HENT trach is unremarkable pulm scattered rhonchi. Equal bilateral breath sounds. FIO2 requirements improved  Card RRR abd soft not tender, colostomy w/ stool and ostomy site pink. Mid abd incision/wound well granulated. Looks clean Ext warm and dry diffuse anasarca GU anuric     Previously hemorrhagic shock requiring PRBCs, Septic shock, MRSA pneumonia s/p 8 days abx Assessment & Plan:   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, status post  tracheostomy Complicated by healthcare associated pneumonia due to MRSA Plan Cont full vent support VAP bundle PAD protocol RASS goal -2 PRN CXR abx per below  Acute metabolic encephalopathy in the setting of renal failure, shock, sepsis Plan Dec Dilaudid to 0.5mg /hr Cont precedex, oxy seroquel and clonaz Cont supportive care  Recurrent septic shock 12/11-->working dx recurrent HCAP Pressor requirements better Tolerating volume removal  Plan Day 4/7 zosyn/vanc Dc vasopressin   AKI, oliguric/anuric --Nephrology consulted. Deemed NOT long term HD candidate.  --CRRT resumed on 12/10 Plan Cont CRRT Renal dose meds Am chem   Duodenal ulcer perforation status post ex lap and Graham's patch --Appreciate CTS assistance and management Plan Cont wound care Not currently PEG candidate given recent duodenal patch and risk of injury associated w/ insufflation (not sure at what point this would be safe to do) will message surgery   Paroxysmal A. fib, currently in sinus rhythm --Patient is in sinus rhythm now Plan Cont tele and IV heparin    Diabetes type 2 w/ marked Hyperglycemia. On 12/12 was hypoglycemia; excellent control after re-adjustment  Plan Cont lantus, ssi and tubefeed coverage   Indeterminate bilateral DVT Plan IV heparin to cont for now   History of stage IV colon cancer Status post pulmonary [facility]atectomies at [facility] + Chemo 1.9 cm cystic appearing lesion located in the pancreatic body Plan Can follow at [facility] if survives acute illness however I fear her functional status is going to limit this     Best practice:  Diet: Tube feeds Pain/Anxiety/Delirium protocol (if indicated) Precedex, dilaudid (changed to 1/2 dose 12/13), oxycodone per tube, clonazepam VAP protocol (if indicated):  yes DVT prophylaxis: IV heparin GI prophylaxis: Protonix Glucose control: lantus + ssi (last adjusted 12/13) Mobility: Bedrest Code Status: Full Goals of care discussion    12/9: Spoke with patient's daughter at bedside at length.  Discussed severity of illness and likely overall worsening despite max efforts.  She and her sister want to continue aggressive support.  RN and RT involved in discussion as well.   12/12: Daughter confirmed aggressive measures  Next multidisciplinary goals of care discussion due 12/16-->we are arranging this w/ family.  Disposition: ICU  My cct 32 min.   Erick Colace ACNP-BC [place] Pager # [phone] OR # [phone] if no answer

## 2020-10-12 NOTE — Progress Notes (Signed)
[place] for heparin Indication: atrial fibrillation  Assessment: 69 y.o. female with a history of atrial fibrillation on chronic Eliquis prior to admission which has been held and patient transitioned to IV heparin infusion per pharmacy dosing consult.   Heparin level remains therapeutic at 0.44.  Hgb/Hct remains low but stable and no overt bleeding noted.   Goal of Therapy:  Heparin level 0.3-0.5 units/ml Monitor platelets by anticoagulation protocol: Yes   Plan:  Continue heparin at 1350 units/hr (13.5 mL/hr) Monitor daily heparin level and CBC while on therapy  Thanks for allowing pharmacy to be a part of this patient's care.   Alanda Slim, PharmD, [facility] Clinical Pharmacist Please see AMION for all Pharmacists' Contact Phone Numbers 10/12/2020, 7:15 AM

## 2020-10-12 NOTE — Consult Note (Signed)
[place] Nurse wound follow up Wound type: Unstageable pressure injury Measurement:see nursing flowsheets Wound bed:100% necrotic/non viable tissue Drainage (amount, consistency, odor) moderate, yellow Periwound:intact  Dressing procedure/placement/frequency: Continue enzymatic debridement Add PT for evaluation for hydrotherapy appropriateness    Discussed in detail with patient's daughter the concepts of skin failure in the presence of MSOF.  Discussed that the skin is the largest organ and when other organs are not working the skin begins to not work either.  Discussed the changes in the vasculopathy related to COVID and the changes in the skin that we see with this and that wounds like this are very common in the very sick related to COVID diagnosis.  Explained rationale for use of enzymatic debridement and the addition of PT for hydrotherapy. Assured her staff will monitor for pain management needs related to this treatment   [place] Nurse team will follow with you and see patient within 10 days for wound assessments.  Please notify [place] nurses of any acute changes in the wounds or any new areas of concern [place] MSN, [place], [place], [place]

## 2020-10-13 LAB — RENAL FUNCTION PANEL
Albumin: 1.8 g/dL — ABNORMAL LOW (ref 3.5–5.0)
Albumin: 2.3 g/dL — ABNORMAL LOW (ref 3.5–5.0)
Anion gap: 10 (ref 5–15)
Anion gap: 7 (ref 5–15)
BUN: 23 mg/dL (ref 8–23)
BUN: 23 mg/dL (ref 8–23)
CO2: 25 mmol/L (ref 22–32)
CO2: 24 mmol/L (ref 22–32)
Calcium: 10.1 mg/dL (ref 8.9–10.3)
Calcium: 9.9 mg/dL (ref 8.9–10.3)
Chloride: 99 mmol/L (ref 98–111)
Chloride: 100 mmol/L (ref 98–111)
Creatinine, Ser: 0.53 mg/dL (ref 0.44–1.00)
Creatinine, Ser: 0.56 mg/dL (ref 0.44–1.00)
GFR, Estimated: >60 mL/min (ref >60)
GFR, Estimated: >60 mL/min (ref >60)
Glucose, Bld: 210 mg/dL — ABNORMAL HIGH (ref 70–99)
Glucose, Bld: 215 mg/dL — ABNORMAL HIGH (ref 70–99)
Phosphorus: 3.4 mg/dL (ref 2.5–4.6)
Phosphorus: 3.6 mg/dL (ref 2.5–4.6)
Potassium: 4.7 mmol/L (ref 3.5–5.1)
Potassium: 4.9 mmol/L (ref 3.5–5.1)
Sodium: 134 mmol/L — ABNORMAL LOW (ref 135–145)
Sodium: 131 mmol/L — ABNORMAL LOW (ref 135–145)

## 2020-10-13 LAB — CBC
HCT: 24.8 % — ABNORMAL LOW (ref 36.0–46.0)
Hemoglobin: 7.6 g/dL — ABNORMAL LOW (ref 12.0–15.0)
MCH: 29.6 pg (ref 26.0–34.0)
MCHC: 30.6 g/dL (ref 30.0–36.0)
MCV: 96.5 fL (ref 80.0–100.0)
Platelets: 192 10*3/uL (ref 150–400)
RBC: 2.57 MIL/uL — ABNORMAL LOW (ref 3.87–5.11)
RDW: 16.1 % — ABNORMAL HIGH (ref 11.5–15.5)
WBC: 12.8 10*3/uL — ABNORMAL HIGH (ref 4.0–10.5)
nRBC: 0.8 % — ABNORMAL HIGH (ref 0.0–0.2)

## 2020-10-13 LAB — GLUCOSE, CAPILLARY
Glucose-Capillary: 197 mg/dL — ABNORMAL HIGH (ref 70–99)
Glucose-Capillary: 165 mg/dL — ABNORMAL HIGH (ref 70–99)
Glucose-Capillary: 194 mg/dL — ABNORMAL HIGH (ref 70–99)
Glucose-Capillary: 180 mg/dL — ABNORMAL HIGH (ref 70–99)
Glucose-Capillary: 145 mg/dL — ABNORMAL HIGH (ref 70–99)
Glucose-Capillary: 131 mg/dL — ABNORMAL HIGH (ref 70–99)

## 2020-10-13 LAB — HEPARIN LEVEL (UNFRACTIONATED)
Heparin Unfractionated: 0.41 IU/mL (ref 0.30–0.70)
Heparin Unfractionated: 0.23 IU/mL — ABNORMAL LOW (ref 0.30–0.70)

## 2020-10-13 LAB — MAGNESIUM: Magnesium: 2.5 mg/dL — ABNORMAL HIGH (ref 1.7–2.4)

## 2020-10-13 LAB — CORTISOL: Cortisol, Plasma: 17.2 ug/dL

## 2020-10-13 MED ORDER — HYDROMORPHONE HCL 1 MG/ML IJ SOLN
0.5000 mg | Freq: Once | INTRAMUSCULAR | Status: AC
  Administered 2020-10-13: 19:00:00 0.5 mg via INTRAVENOUS

## 2020-10-13 MED ORDER — ALBUMIN HUMAN 25 % IV SOLN
25.0000 g | Freq: Once | INTRAVENOUS | Status: AC
  Administered 2020-10-13: 10:00:00 25 g via INTRAVENOUS
  Filled 2020-10-13: qty 100

## 2020-10-13 MED ORDER — HYDROMORPHONE HCL 1 MG/ML IJ SOLN
0.5000 mg | INTRAMUSCULAR | Status: DC | PRN
  Administered 2020-10-13 – 2020-10-14 (×5): 0.5 mg via INTRAVENOUS
  Filled 2020-10-13 (×6): qty 0.5

## 2020-10-13 NOTE — Progress Notes (Signed)
100 mL Dilaudid wasted in stericycle with Shonna Chock, RN

## 2020-10-13 NOTE — Progress Notes (Signed)
[place] for heparin Indication: atrial fibrillation  Assessment: 69 y.o. female with a history of atrial fibrillation on chronic Eliquis prior to admission which has been held and patient transitioned to IV heparin infusion per pharmacy dosing consult.   Heparin level slightly supra therapeutic at 0.53.  Hgb/Hct remains low but stable and no overt bleeding noted.   Goal of Therapy:  Heparin level 0.3-0.5 units/ml Monitor platelets by anticoagulation protocol: Yes   Plan:  Decrease heparin to 1300 units/hr (13 mL/hr) Recheck Heparin level in 8 hours Monitor daily heparin level and CBC while on therapy  Thanks for allowing pharmacy to be a part of this patient's care.  Alanda Slim, PharmD, [facility] Clinical Pharmacist Please see AMION for all Pharmacists' Contact Phone Numbers 10/13/2020, 7:15 AM

## 2020-10-13 NOTE — Progress Notes (Signed)
Physical Therapy Wound Treatment Patient Details  Name: Candace [name] MRN: [id number] Date of Birth: 1951/03/21  Today's Date: 10/13/2020 Time: 1210-1058 Time Calculation (min): 1368 min  Subjective  Subjective: Lethargic on vent. Daughter present Patient and Family Stated Goals: Heal wound; maintain pt comfort per daughter Date of Onset:  (Unknown)  Pain Score:  Pt premedicated and tolerated treatment well without obvious pain or discomfort.  Wound Assessment  Pressure Injury 09/25/20 Sacrum Bilateral;Medial Deep Tissue Pressure Injury - Purple or maroon localized area of discolored intact skin or blood-filled blister due to damage of underlying soft tissue from pressure and/or shear. Purple, non-blanchable, b (Active)  Wound Image   10/13/20 1334  Dressing Type ABD;Barrier Film (skin prep);Gauze (Comment);Moist to dry 10/13/20 1334  Dressing Changed;Clean;Dry;Intact 10/13/20 1334  Dressing Change Frequency Daily 10/13/20 1334  State of Healing Eschar 10/13/20 1334  Site / Wound Assessment Red;Black 10/13/20 1334  % Wound base Red or Granulating 20% 10/13/20 1200  % Wound base Yellow/Fibrinous Exudate 0% 10/13/20 1200  % Wound base Black/Eschar 80% 10/13/20 1200  % Wound base Other/Granulation Tissue (Comment) 0% 10/13/20 1334  Peri-wound Assessment Excoriated 10/13/20 1334  Wound Length (cm) 13 cm 10/13/20 1200  Wound Width (cm) 12.5 cm 10/13/20 1200  Wound Depth (cm) 0.1 cm 10/13/20 1200  Wound Surface Area (cm^2) 162.5 cm^2 10/13/20 1200  Wound Volume (cm^3) 16.25 cm^3 10/13/20 1200  Tunneling (cm) 0 10/13/20 1200  Undermining (cm) 0 10/13/20 1200  Margins Unattached edges (unapproximated) 10/13/20 1334  Drainage Amount Minimal 10/13/20 1334  Drainage Description Serosanguineous 10/13/20 1334  Treatment Debridement (Selective);Hydrotherapy (Pulse lavage);Packing (Saline gauze) 10/13/20 1334   Santyl applied to wound bed prior to applying dressing.     Hydrotherapy Pulsed  lavage therapy - wound location: sacrum Pulsed Lavage with Suction (psi): 8 psi (8-12) Pulsed Lavage with Suction - Normal Saline Used: 1000 mL Pulsed Lavage Tip: Tip with splash shield Selective Debridement Selective Debridement - Location: sacrum Selective Debridement - Tools Used: Forceps;Scalpel Selective Debridement - Tissue Removed: eschar   Wound Assessment and Plan  Wound Therapy - Assess/Plan/Recommendations Wound Therapy - Clinical Statement: Pt presents to hydrotherapy with an unstagable pressure injury. Debridement initiated on areas of eschar however there are other areas of wound that are still considered a deep tissue injury. Anticipate wound will evolve and more area will be considered for selective debridement. This patient will benefit from continued hydrotherapy for selective removal of unviable tissue, to decrease bioburden and promote wound bed healing. Wound Therapy - Functional Problem List: Global weakness Factors Delaying/Impairing Wound Healing: Diabetes Mellitus;Immobility;Multiple medical problems Hydrotherapy Plan: Debridement;Dressing change;Patient/family education;Pulsatile lavage with suction Wound Therapy - Frequency: 6X / week Wound Therapy - Follow Up Recommendations: Skilled nursing facility Wound Plan: See above  Wound Therapy Goals- Improve the function of patient's integumentary system by progressing the wound(s) through the phases of wound healing (inflammation - proliferation - remodeling) by: Decrease Necrotic Tissue to: 20% Decrease Necrotic Tissue - Progress: Goal set today Increase Granulation Tissue to: 80% Increase Granulation Tissue - Progress: Goal set today Goals/treatment plan/discharge plan were made with and agreed upon by patient/family: No, Patient unable to participate in goals/treatment/discharge plan and family unavailable Time For Goal Achievement: 7 days Wound Therapy - Potential for Goals: Good  Goals will be updated until  maximal potential achieved or discharge criteria met.  Discharge criteria: when goals achieved, discharge from hospital, MD decision/surgical intervention, no progress towards goals, refusal/missing three consecutive treatments without notification or medical reason.  GP     Thelma Comp 10/13/2020, 1:59 PM  Rolinda Roan, PT, DPT Acute Rehabilitation Services Pager: [phone] Office: [phone]

## 2020-10-13 NOTE — Progress Notes (Signed)
Patient ID: Candace [name], female   DOB: June 12, 1951, 69 y.o.   MRN: [id number]  [facility] Progress Note   Assessment/ Plan:   1. Acute kidney Injury: Likely from ATN associated with multifactorial shock-septic shock; MRSA pneumonia as well as hemorrhagic shock.  She remains anuric and without any evidence of renal recovery; given hypercatabolic rate and inability to tolerate intermittent hemodialysis, we will continue CRRT at the current prescription. 2.  Septic shock secondary to MRSA pneumonia: Status post completion of intravenous vancomycin and ongoing support with pressors and midodrine. 3.  Perforated duodenal ulcer with acute blood loss anemia: Status post exploratory laparotomy and Graham patch placement for definitive management. 4.  Acute hypoxic/hypercapnic respiratory failure: Secondary to ARDS from COVID-19 infection 5.  Severe protein calorie malnutrition  Subjective:   Without acute events overnight, remains on pressors   Objective:   BP 118/66   Pulse 84   Temp (!) 96.4 F (35.8 C) (Axillary)   Resp (!) 28   Ht 5' 8"  (1.727 m)   Wt 89.7 kg   SpO2 100%   BMI 30.07 kg/m   Intake/Output Summary (Last 24 hours) at 10/13/2020 0800 Last data filed at 10/13/2020 0700 Gross per 24 hour  Intake 2943.16 ml  Output 3288 ml  Net -344.84 ml   Weight change: 4.2 kg  Physical Exam: Gen: Sleeping comfortably, on ventilator via tracheostomy.  On CRRT. CVS: Pulse irregular tachycardia, S1 and S2 normal. right subclavian hemodialysis catheter. Resp: Coarse breath sounds bilaterally without distinct rales or rhonchi Abd: Soft, flat, bowel sounds normal Ext: 2+ anasarca.  Imaging: DG Chest Port 1 View  Result Date: 10/12/2020 CLINICAL DATA:  ARDS. EXAM: PORTABLE CHEST 1 VIEW COMPARISON:  10/08/2020. FINDINGS: Tracheostomy tube, feeding tube, right PICC line, right subclavian line in stable position. Heart size normal. Bilateral pulmonary infiltrates/edema again  noted without interim change. Small bilateral pleural effusions again noted without interim change. No pneumothorax. IMPRESSION: 1. Lines and tubes in stable position. 2. Bilateral pulmonary infiltrates/edema and small bilateral pleural effusions again noted without interim change. Electronically Signed   By: Marcello Moores  Register   On: 10/12/2020 05:25    Labs: BMET Recent Labs  Lab 10/10/20 0500 10/10/20 1624 10/11/20 0348 10/11/20 1700 10/12/20 0338 10/12/20 1624 10/13/20 0319  NA 135 133* 131* 135 133* 132* 134*  K 3.7 5.1 4.8 4.5 4.7 4.8 4.7  CL 101 99 99 100 100 99 99  CO2 26 24 23 25 23 25 25   GLUCOSE 113* 265* 381* 258* 135* 192* 210*  BUN 25* 24* 24* 21 20 21 23   CREATININE 0.44 0.52 0.46 0.48 0.38* 0.50 0.53  CALCIUM 9.0 8.5* 8.8* 9.5 9.6 10.2 10.1  PHOS 2.7 3.6 3.5 3.4 3.3 3.3 3.4   CBC Recent Labs  Lab 10/10/20 0500 10/11/20 0348 10/12/20 0338 10/13/20 0319  WBC 10.3 14.9* 12.7* 12.8*  HGB 8.8* 8.4* 7.8* 7.6*  HCT 27.0* 27.8* 25.3* 24.8*  MCV 93.1 97.5 98.1 96.5  PLT 171 219 232 192    Medications:    . B-complex with vitamin C  1 tablet Per Tube Daily  . chlorhexidine gluconate (MEDLINE KIT)  15 mL Mouth Rinse BID  . Chlorhexidine Gluconate Cloth  6 each Topical Daily  . clonazepam  1 mg Per Tube BID  . collagenase   Topical Daily  . darbepoetin (ARANESP) injection - DIALYSIS  60 mcg Subcutaneous Q Tue-1800  . docusate  100 mg Per Tube BID  . feeding supplement (PROSource  TF)  90 mL Per Tube TID  . insulin aspart  0-15 Units Subcutaneous Q4H  . insulin aspart  5 Units Subcutaneous Q4H  . insulin glargine  25 Units Subcutaneous Daily  . mouth rinse  15 mL Mouth Rinse QID  . midazolam  1 mg Intravenous Once  . midodrine  20 mg Per Tube TID WC  . oxyCODONE  5 mg Per Tube Q8H  . pantoprazole sodium  40 mg Per Tube BID  . polyethylene glycol  17 g Per Tube Daily  . QUEtiapine  100 mg Per Tube BID  . sodium chloride flush  10-40 mL Intracatheter Q12H    Elmarie Shiley, MD 10/13/2020, 8:00 AM

## 2020-10-13 NOTE — Progress Notes (Signed)
Pharmacy Antibiotic Note  Candace [name] is a 69 y.o. female admitted on 08/31/2020 with sepsis.  Pharmacy has been consulted for Vanc and Zosyn dosing.  Patient on CRRT.  Today is day 5 of 7. Blood cultures - no growth to date  Plan: Vanc 1 gm IV q24hr while on CRRT Zosyn 3.375 gm IV q6hr while on CRRT Monitor dialysis, C&S and Vanc level as needed  Height: 5\' 8"  (172.7 cm) Weight: 89.7 kg (197 lb 12 oz) IBW/kg (Calculated) : 63.9  Temp (24hrs), Avg:98.1 F (36.7 C), Min:97 F (36.1 C), Max:98.7 F (37.1 C)  Recent Labs  Lab 10/09/20 0417 10/09/20 1611 10/10/20 0500 10/10/20 1624 10/11/20 0348 10/11/20 1700 10/12/20 0338 10/12/20 1624 10/13/20 0319  WBC 10.8*  --  10.3  --  14.9*  --  12.7*  --  12.8*  CREATININE 0.55   < > 0.44   < > 0.46 0.48 0.38* 0.50 0.53   < > = values in this interval not displayed.    Estimated Creatinine Clearance: 77.7 mL/min (by C-G formula based on SCr of 0.53 mg/dL).    No Known Allergies  Antimicrobials this admission: Vanc 12/11 >>  Zosyn 12/11 >>   Thank you for allowing pharmacy to be a part of this patient's care.  Alanda Slim, PharmD, [facility] Clinical Pharmacist Please see AMION for all Pharmacists' Contact Phone Numbers 10/13/2020, 7:25 AM

## 2020-10-13 NOTE — Progress Notes (Signed)
NAMEMaryum [name], MRN:  [id number], DOB:  07-Jul-1951, LOS: 55 ADMISSION DATE:  08/31/2020, CONSULTATION DATE:  09/07/2020 REFERRING MD:  Dr Candace [name], CHIEF COMPLAINT:  Acute resp failure  Brief History   69 year old female who was previously diagnosed with Covid 08/23/2020.  Admitted 11/2 with AF-RVR, found to have a perforated duodenal underwent exploratory laparotomy with Phillip Heal patch placement.  Past Medical History  Covid pneumonia Atrial fibrillation CKD stage III Diabetes mellitus Hypertension Colon cancer Hyperlipidemia  Significant [name] Events   11/2 Admitted  11/3 OR -> perforated duodenal ulcer 11/10 progressive hemorrhagic shock, intubated, transfused, pressors, proned; started on CRRT in PM 11/16 Extubated. Re-intubated overnight due to respiratory distress and hypoxia with decreased mentation 11/18  bronch'd/ cultures sent 11/19 hgb down getting blood 09/22/2020 spiked fever resume empirical antimicrobial therapy 11/26: hemorrhagic shock, hgb 5.6, increased pressors, CT A/P  11/30 per palliative "Candace [name] expresses understanding that patient is unfortunately very fragile despite ongoing intensive medical treatment and full mechanical support. She indicates that the family wants to continue with all current interventions despite potential outcomes". 12/8: CRRT discontinued due to clotting 12/9 family requested transfer to tertiary care Candace [name]). They did not feel that they could offer further treatment and denied transfer  12/10-12/12: CRRT restarted. Continues to have episodes of tachycardia, tachypnea that seem to improve with pain management 12/11 back in shock. Pressor requirements  Up. CXR worse. ABX resumed 12/12 still requiring inc pressors. Had hypoglycemic event. Basal insulin dosing adjusted 12/13 pressor requirements better. Now hyperglycemic. Re-adjusted Glycemic control  12/14 changed dilaudid to 1/2 dosing from day further. Dc vasopressin.  Goals of care  reconfirmed with daughter.  Patient continues to desire aggressive care.  Not open to discussing any other option, patient family continues to be hopeful that she will be discharged to home with full recovery in spite of multiple attempts by staff to prepare them that this is unlikely scenario 12/15 Dilaudid discontinued sending cortisol for ongoing pressor dependence Consults:  Cardiology [facility] Nephrology   Procedures:  11/3 Exploratory laparotomy, Phillip Heal patch, lysis of adhesion for duodenal ulceration postop day 6  R PICC 11/5 >> A line 11/9 >> out ETT 11/9 > 11/16, 11/16 >> 09/21/2020 09/21/2020 tracheostomy>> Lt [place] CVL 11/9 >> R IJ trialysis >> out HD catheter 12/1 >>   Significant Diagnostic Tests:  11/3 CT abd/ pelvis > 1. Positive for bowel perforation: Pneumoperitoneum and intermediate density free fluid in the abdomen. Prior total colectomy. The specific site of perforation is unclear-oral contrast present to the proximal jejunum has not obviously leaked. Note that there may be small bowel loops adherent to the ventral abdominal wall along the greater curve of the stomach. 2. Extensive bilateral lower lung pneumonia. No pleural effusion. 3. Other abdominal and pelvic viscera are stable since 2015, including bilateral adrenal adenomas. Chronic renal parapelvic cysts. 4. Aortic Atherosclerosis 11/3 TTE > EF 70-75%, RV not well visualized, mildly reduced RV systolic function 76/28 CT chest/ abd/ pelvis> 1. Interval progression of diffuse bilateral hazy ground-glass airspace opacities with more focal areas of consolidation at the lung bases 2. Trace bilateral pleural effusions. 3. Postsurgical changes the abdomen as detailed above. No evidence for a postoperative abscess, however evaluation is limited by lack of IV contrast. 4. There is a 1.9 cm cystic appearing lesion located in the pancreatic body. This was not present on the patient's CT from 2015.  Follow-up with an outpatient  contrast enhanced MRI is recommended. 5. The endometrial stripe appears diffusely thickened.  Follow-up with pelvic ultrasound is recommended. Aortic Atherosclerosis 11/14 LE doppler studies > + DVT of right posterior tibial and peroneal vein, +dVT of left posterior tibial vein  11/26 CT ABD PEL > liver unremarkable, distended gallbladder with layering tiny gallstones versus sludge, no duct dilatation, mild hyperdensity right upper pole renal collecting system new.  No evidence of retroperitoneal bleeding.  Multifocal lower lobe predominant pulmonary infiltrates/pneumonia.  Small left pleural effusion.  Micro Data:  11/10 MRSA PCR > neg 11/10 BC x 2 > neg 09/22/2020 blood cultures x2>> S epi.  09/22/2020 sputum culture>> MRSA Blood 12/1 >> negative. S.epi 1 out of 2, likely contaminant Respiratory culture 12/13>>>  Antimicrobials:  azithro 11/2 >11/3 Ceftriaxone 11/2  Fluconazole 11/3 Zosyn 11/3 >> 11/7 Vanc 11/10 off Cefepime 11/10 > 11/16 09/22/2020 vancomycin for MRSA PNA >> 12/2  Vanc 12/11> Zosyn 12/11>  Subjective:  No significant change overnight  Objective   Blood pressure 115/67, pulse 86, temperature (Abnormal) 96.4 F (35.8 C), temperature source Axillary, resp. rate (Abnormal) 23, height 5\' 8"  (1.727 m), weight 89.7 kg, SpO2 100 %.    Vent Mode: PRVC FiO2 (%):  [40 %-50 %] 40 % Set Rate:  [28 bmp] 28 bmp Vt Set:  [450 mL] 450 mL PEEP:  [10 cmH20] 10 cmH20 Plateau Pressure:  [24 cmH20-32 cmH20] 26 cmH20   Intake/Output Summary (Last 24 hours) at 10/13/2020 0851 Last data filed at 10/13/2020 5465 Gross per 24 hour  Intake 3128.14 ml  Output 3388 ml  Net -259.86 ml   Filed Weights   10/11/20 0356 10/12/20 0420 10/13/20 0317  Weight: 92.2 kg 85.5 kg 89.7 kg   Physical Exam: General 69 year old black female remains pressor, vent, and CRRT dependent. HEENT normocephalic atraumatic no JVD tracheostomy unchanged Pulmonary: Equal bilateral breath sounds some  scattered rhonchi Cardiac: Regular rate and rhythm Abdomen: Dressing intact, colostomy draining.  Ostomy integrity looks intact GU: Anuric Extremities: Dependent edema Neuro: Awake, tracks in the room, will follow commands but profoundly weak    Previously hemorrhagic shock requiring PRBCs, Septic shock, MRSA pneumonia s/p 8 days abx Assessment & Plan:   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, status post tracheostomy Complicated by healthcare associated pneumonia due to MRSA Last portable chest x-ray mildly improved on 12/14 Plan Continue full ventilator support VAP bundle PAD protocol RASS goal -1 As needed chest x-ray  Acute metabolic encephalopathy in the setting of renal failure, shock, sepsis Plan We will try to discontinue Dilaudid altogether today  Continue oxycodone, Seroquel and clonazepam  Continue supportive care   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 Deemed NOT long term HD candidate.  --CRRT resumed on 12/10 Plan Continuing CRRT A.m. chemistry Renal dose medications  Duodenal ulcer perforation status post ex lap and Graham's patch --Appreciate CTS assistance and management Plan Continue wound care  Not a PEG candidate given duodenal patch at this point.  There is concerned about insufflation of the GI tract during placement, not sure how long this is a risk for.  Paroxysmal A. fib, currently in sinus rhythm --Patient is in sinus rhythm now Plan Continue telemetry and IV heparin   Diabetes type 2 w/ marked Hyperglycemia. On 12/12  was hypoglycemia; excellent control after  re-adjustment  Plan Continue Lantus and  sliding scale  Indeterminate bilateral DVT Plan Continue IV heparin  History of stage IV colon cancer Status post pulmonary [facility]ctom[facility] + Chemo 1.9 cm cystic appearing lesion located in the pancreatic body Plan She can follow at [facility] if she survives, at this point given her multiple comorbidities, and critical illness I think it would be a long time before any malignancy could be addressed given functional status    Best practice:  Diet: Tube feeds, these are being tolerated Pain/Anxiety/Delirium protocol (if indicated) Precedex.Marland Kitchen discontinued IV Dilaudid altogether on 12/15), oxycodone per tube, clonazepam VAP protocol (if indicated): yes DVT prophylaxis: IV heparin GI prophylaxis: Protonix Glucose control: lantus + ssi (last adjusted 12/13) Mobility: Bedrest Code Status: Full Goals of care discussion last completed on 12/14 with daughter, this was a multidisciplinary discussion including nurse practitioner, bedside nurse, and the patient's daughter.  There have been no changes in goals of care, family continues to desire any and all therapies, remains full code, family remains hopeful that she will survive and be discharged to home  Next multidisciplinary goals of care discussion due 12/21   my cct 32 min  Erick Colace ACNP-BC [place] Pager # [phone] OR # [phone] if no answer

## 2020-10-13 NOTE — Progress Notes (Signed)
[place] for heparin Indication: atrial fibrillation  Assessment: 69 y.o. female with a history of atrial fibrillation on chronic Eliquis prior to admission which has been held and patient transitioned to IV heparin infusion per pharmacy dosing consult.   Heparin level now subtherapeutic at 0.23.   Hgb/Hct remains low but stable and no overt bleeding noted.   Goal of Therapy:  Heparin level 0.3-0.5 units/ml Monitor platelets by anticoagulation protocol: Yes   Plan:  Increase heparin to 1350 units/hr Recheck Heparin level in 6 hours Monitor daily CBC, s/sx bleeding   Arturo Morton, PharmD, BCPS Please check AMION for all [place] contact numbers Clinical Pharmacist 10/13/2020 7:42 PM

## 2020-10-14 LAB — RENAL FUNCTION PANEL
Albumin: 1.9 g/dL — ABNORMAL LOW (ref 3.5–5.0)
Albumin: 1.8 g/dL — ABNORMAL LOW (ref 3.5–5.0)
Anion gap: 9 (ref 5–15)
Anion gap: 9 (ref 5–15)
BUN: 22 mg/dL (ref 8–23)
BUN: 28 mg/dL — ABNORMAL HIGH (ref 8–23)
CO2: 24 mmol/L (ref 22–32)
CO2: 23 mmol/L (ref 22–32)
Calcium: 9.7 mg/dL (ref 8.9–10.3)
Calcium: 9.6 mg/dL (ref 8.9–10.3)
Chloride: 99 mmol/L (ref 98–111)
Chloride: 100 mmol/L (ref 98–111)
Creatinine, Ser: 0.5 mg/dL (ref 0.44–1.00)
Creatinine, Ser: 0.54 mg/dL (ref 0.44–1.00)
GFR, Estimated: >60 mL/min (ref >60)
GFR, Estimated: >60 mL/min (ref >60)
Glucose, Bld: 153 mg/dL — ABNORMAL HIGH (ref 70–99)
Glucose, Bld: 251 mg/dL — ABNORMAL HIGH (ref 70–99)
Phosphorus: 3.2 mg/dL (ref 2.5–4.6)
Phosphorus: 3.7 mg/dL (ref 2.5–4.6)
Potassium: 5.2 mmol/L — ABNORMAL HIGH (ref 3.5–5.1)
Potassium: 5.1 mmol/L (ref 3.5–5.1)
Sodium: 132 mmol/L — ABNORMAL LOW (ref 135–145)
Sodium: 132 mmol/L — ABNORMAL LOW (ref 135–145)

## 2020-10-14 LAB — MAGNESIUM: Magnesium: 2.4 mg/dL (ref 1.7–2.4)

## 2020-10-14 LAB — CBC
HCT: 25.9 % — ABNORMAL LOW (ref 36.0–46.0)
Hemoglobin: 7.7 g/dL — ABNORMAL LOW (ref 12.0–15.0)
MCH: 28.9 pg (ref 26.0–34.0)
MCHC: 29.7 g/dL — ABNORMAL LOW (ref 30.0–36.0)
MCV: 97.4 fL (ref 80.0–100.0)
Platelets: 210 10*3/uL (ref 150–400)
RBC: 2.66 MIL/uL — ABNORMAL LOW (ref 3.87–5.11)
RDW: 15.9 % — ABNORMAL HIGH (ref 11.5–15.5)
WBC: 16.5 10*3/uL — ABNORMAL HIGH (ref 4.0–10.5)
nRBC: 1 % — ABNORMAL HIGH (ref 0.0–0.2)

## 2020-10-14 LAB — GLUCOSE, CAPILLARY
Glucose-Capillary: 128 mg/dL — ABNORMAL HIGH (ref 70–99)
Glucose-Capillary: 141 mg/dL — ABNORMAL HIGH (ref 70–99)
Glucose-Capillary: 250 mg/dL — ABNORMAL HIGH (ref 70–99)
Glucose-Capillary: 178 mg/dL — ABNORMAL HIGH (ref 70–99)
Glucose-Capillary: 234 mg/dL — ABNORMAL HIGH (ref 70–99)
Glucose-Capillary: 216 mg/dL — ABNORMAL HIGH (ref 70–99)

## 2020-10-14 LAB — FOLATE: Folate: 12.2 ng/mL (ref >5.9)

## 2020-10-14 LAB — HEPARIN LEVEL (UNFRACTIONATED)
Heparin Unfractionated: 0.42 IU/mL (ref 0.30–0.70)
Heparin Unfractionated: 0.27 IU/mL — ABNORMAL LOW (ref 0.30–0.70)
Heparin Unfractionated: 0.51 IU/mL (ref 0.30–0.70)

## 2020-10-14 LAB — VITAMIN D 25 HYDROXY (VIT D DEFICIENCY, FRACTURES): Vit D, 25-Hydroxy: 18.37 ng/mL — ABNORMAL LOW (ref 30–100)

## 2020-10-14 MED ORDER — HYDROMORPHONE HCL 1 MG/ML IJ SOLN
1.0000 mg | INTRAMUSCULAR | Status: DC | PRN
Start: 2020-10-14 — End: 2020-10-15
  Administered 2020-10-14 (×2): 1 mg via INTRAVENOUS
  Filled 2020-10-14 (×2): qty 1

## 2020-10-14 MED ORDER — VITAL 1.5 CAL PO LIQD
1000.0000 mL | ORAL | Status: DC
  Administered 2020-10-14 – 2020-11-16 (×37): 1000 mL
  Filled 2020-10-14 (×24): qty 1000

## 2020-10-14 MED ORDER — JUVEN PO PACK
1.0000 | PACK | Freq: Two times a day (BID) | ORAL | Status: DC
  Administered 2020-10-14 – 2020-11-18 (×68): 1
  Filled 2020-10-14 (×68): qty 1

## 2020-10-14 MED ORDER — ALBUMIN HUMAN 25 % IV SOLN
25.0000 g | Freq: Once | INTRAVENOUS | Status: AC
  Administered 2020-10-14: 23:00:00 25 g via INTRAVENOUS
  Filled 2020-10-14: qty 100

## 2020-10-14 MED ORDER — HYDROCORTISONE NA SUCCINATE PF 100 MG IJ SOLR
50.0000 mg | Freq: Four times a day (QID) | INTRAMUSCULAR | Status: DC
  Administered 2020-10-14 – 2020-10-16 (×8): 50 mg via INTRAVENOUS
  Filled 2020-10-14 (×9): qty 2

## 2020-10-14 MED ORDER — ATROPINE SULFATE 1 MG/10ML IJ SOSY
PREFILLED_SYRINGE | INTRAMUSCULAR | Status: AC
  Filled 2020-10-14: qty 10

## 2020-10-14 MED ORDER — SODIUM ZIRCONIUM CYCLOSILICATE 10 G PO PACK
10.0000 g | PACK | Freq: Four times a day (QID) | ORAL | Status: AC
  Administered 2020-10-14 (×2): 10 g
  Filled 2020-10-14 (×2): qty 1

## 2020-10-14 NOTE — Progress Notes (Signed)
[place] for Heparin Indication: atrial fibrillation  Assessment: 69 y.o. female with a history of atrial fibrillation on chronic Eliquis prior to admission which has been held and patient transitioned to IV heparin infusion per pharmacy dosing consult.   Heparin level slightly subtherapeutic this afternoon at 0.27. CBC stable. No bleeding or issues with infusion per discussion with RN (drip charted as paused, but RN confirms heparin running currently without issue).  Goal of Therapy:  Heparin level 0.3-0.5 units/ml Monitor platelets by anticoagulation protocol: Yes   Plan:  Increase heparin to 1400 units/hr Recheck heparin level in 8 hours Monitor daily CBC, s/sx bleeding   Arturo Morton, PharmD, BCPS Please check AMION for all [place] contact numbers Clinical Pharmacist 10/14/2020 2:29 PM

## 2020-10-14 NOTE — Progress Notes (Signed)
Patient ID: Candace [name], female   DOB: Sep 20, 1951, 69 y.o.   MRN: [id number]  [facility] Progress Note   Assessment/ Plan:   1. Acute kidney Injury: Likely from ATN associated with multifactorial shock-septic shock; MRSA pneumonia as well as hemorrhagic shock.  She remains anuric and without any evidence of renal recovery; given hypercatabolic rate and inability to tolerate intermittent hemodialysis, we will continue CRRT at the current prescription.  Slight rise of potassium level noted noted; will give Lokelma via her NG tube. 2.  Septic shock secondary to MRSA pneumonia: Status post completion of intravenous vancomycin and ongoing support with pressors and midodrine. 3.  Perforated duodenal ulcer with acute blood loss anemia: Status post exploratory laparotomy and Graham patch placement for definitive management. 4.  Acute hypoxic/hypercapnic respiratory failure: Secondary to ARDS from COVID-19 infection 5.  Severe protein calorie malnutrition: On enteral feeding.  Subjective:   Without acute events overnight, remains on low-dose pressors   Objective:   BP 134/65   Pulse 87   Temp 97.8 F (36.6 C) (Oral)   Resp 17   Ht 5' 8"  (1.727 m)   Wt 89 kg   SpO2 100%   BMI 29.83 kg/m   Intake/Output Summary (Last 24 hours) at 10/14/2020 0900 Last data filed at 10/14/2020 0800 Gross per 24 hour  Intake 3080.76 ml  Output 3204 ml  Net -123.24 ml   Weight change: -0.7 kg  Physical Exam: Gen: Sleeping comfortably, on ventilator via tracheostomy.  On CRRT. CVS: Pulse irregular tachycardia, S1 and S2 normal. right subclavian hemodialysis catheter. Resp: Coarse breath sounds bilaterally without distinct rales or rhonchi Abd: Soft, midline dressing is clean and intact.  RLQ colostomy bag in place. Ext: 1+ anasarca.  Imaging: No results found.  Labs: BMET Recent Labs  Lab 10/11/20 0348 10/11/20 1700 10/12/20 0338 10/12/20 1624 10/13/20 0319 10/13/20 1659  10/14/20 0320  NA 131* 135 133* 132* 134* 131* 132*  K 4.8 4.5 4.7 4.8 4.7 4.9 5.2*  CL 99 100 100 99 99 100 99  CO2 23 25 23 25 25 24 24   GLUCOSE 381* 258* 135* 192* 210* 215* 153*  BUN 24* 21 20 21 23 23 22   CREATININE 0.46 0.48 0.38* 0.50 0.53 0.56 0.50  CALCIUM 8.8* 9.5 9.6 10.2 10.1 9.9 9.7  PHOS 3.5 3.4 3.3 3.3 3.4 3.6 3.2   CBC Recent Labs  Lab 10/11/20 0348 10/12/20 0338 10/13/20 0319 10/14/20 0320  WBC 14.9* 12.7* 12.8* 16.5*  HGB 8.4* 7.8* 7.6* 7.7*  HCT 27.8* 25.3* 24.8* 25.9*  MCV 97.5 98.1 96.5 97.4  PLT 219 232 192 210    Medications:    . B-complex with vitamin C  1 tablet Per Tube Daily  . chlorhexidine gluconate (MEDLINE KIT)  15 mL Mouth Rinse BID  . Chlorhexidine Gluconate Cloth  6 each Topical Daily  . clonazepam  1 mg Per Tube BID  . collagenase   Topical Daily  . darbepoetin (ARANESP) injection - DIALYSIS  60 mcg Subcutaneous Q Tue-1800  . docusate  100 mg Per Tube BID  . feeding supplement (PROSource TF)  90 mL Per Tube TID  . insulin aspart  0-15 Units Subcutaneous Q4H  . insulin aspart  5 Units Subcutaneous Q4H  . insulin glargine  25 Units Subcutaneous Daily  . mouth rinse  15 mL Mouth Rinse QID  . midazolam  1 mg Intravenous Once  . midodrine  20 mg Per Tube TID WC  . oxyCODONE  5 mg Per Tube Q8H  . pantoprazole sodium  40 mg Per Tube BID  . polyethylene glycol  17 g Per Tube Daily  . QUEtiapine  100 mg Per Tube BID  . sodium chloride flush  10-40 mL Intracatheter Q12H   Elmarie Shiley, MD 10/14/2020, 9:00 AM

## 2020-10-14 NOTE — Progress Notes (Signed)
Nutrition Follow-up  DOCUMENTATION CODES:   Obesity unspecified  INTERVENTION:   Tube Feedingvia Cortrak: Vital 1.5 at 65 ml/hr Pro-source TF 90 mL TID Provides 171 g of protein, 2580 kcals and 1094 mL of free water  Add Juven BID, each packet provides 80 calories, 8 grams of carbohydrate, 2.5  grams of protein (collagen), 7 grams of L-arginine and 7 grams of L-glutamine; supplement contains CaHMB, Vitamins C, E, B12 and Zinc to promote wound healing  Continue B-complex with C  Recommend rechecking copper post supplementation as pt continues on CRRT and checking zinc, selenium, vitamin A, Vitamin D, folate  NUTRITION DIAGNOSIS:   Inadequate oral intake related to inability to eat as evidenced by NPO status.  Being addressed via TF  GOAL:   Patient will meet greater than or equal to 90% of their needs  Met  MONITOR:   Vent status,TF tolerance,Labs,Weight trends  REASON FOR ASSESSMENT:   Consult New TPN/TNA  ASSESSMENT:   69 y.o. female was recently hospitalized for COVID-19 pneumonia-and discharged on 2-3 L of home O2-presents with worsening shortness of breath-found to have A. fib with RVR, worsening hypoxia. Pt developed severe upper abdominal pain-subsequent further imaging studies revealed perforated viscus. S/p laparotomy on 11/3 which showed a perforated duodenal ulcer.  11/03Exploratory laparotomy, adhesiolysis, modified graham patch repair of perforated duodenal ulcer 11/05 TPN initiated 11/08 UGI negative for leak 11/09 Acute decline in respiratory and mental status requiring intubation 11/10 Started on trickle TF, CT abdomen negative for postop complication 27/61 TPN discontinued due to elevated LFTS, TGs; TF increased to 30 ml/hr, Started CRRT 11/16 Extubated, Re-intubated 11/23 Trach placed, CRRT d/c 11/24 CRRT restarted due to pressors, hyperkalemia 11/26 Hemorrhagic shock, Hgb 5.6 12/04 CRRT stopped 12/05 CRRT restarted after failed iHD 12/08  CRRT held 12/10 CRRT restarted  Pt remains on vent support via trach, remains on levophed and CRRT. Pt has been unable to tolerate   Ultimately needs permanent feeding access PEG for continued ongoing aggressive care, however we are unable to place G-tube currently because gastric insufflation would put her duodenal patch at risk.  Vital 1.5 at 60 ml/hr, Pro-Source TF 90 mL TID via Cortrak  Pt receiving hydrotherapy for DTI sacrum  Vitamin Labs:  Copper: 10 (L) Vitamin C: 1.1 (wdl)  Labs: sodium 132 (L), potassium 5.2 (H) Meds: B-complex with C, solucortef, ss novolog, novolog q 4 hours, lantus, miralax, lokelma   Diet Order:   Diet Order    None      EDUCATION NEEDS:   Not appropriate for education at this time  Skin:  Skin Assessment: Skin Integrity Issues: Skin Integrity Issues:: Stage II DTI: sacrum Stage I: n/a Stage II: ear Incisions: abdomen  Last BM:  12/16 ileostomy  Height:   Ht Readings from Last 1 Encounters:  09/20/20 5' 8"  (1.727 m)    Weight:   Wt Readings from Last 1 Encounters:  10/14/20 89 kg    BMI:  Body mass index is 29.83 kg/m.  Estimated Nutritional Needs:   Kcal:  4709-2957 kcals  Protein:  135-175 g  Fluid:  >/= 2 L/day   Kerman Passey MS, RDN, LDN, CNSC Registered Dietitian III Clinical Nutrition RD Pager and On-Call Pager Number Located in [place]

## 2020-10-14 NOTE — Progress Notes (Signed)
Daughter updated on plan of care at bedside  Erick Colace ACNP-BC [place] Pager # [phone] OR # [phone] if no answer

## 2020-10-14 NOTE — Progress Notes (Signed)
[place] for Heparin Indication: atrial fibrillation  Assessment: 69 y.o. female with a history of atrial fibrillation on chronic Eliquis prior to admission which has been held and patient transitioned to IV heparin infusion per pharmacy dosing consult.    12/16 AM update:  Heparin level at goal after slight rate increase Hgb remains low but stable  Goal of Therapy:  Heparin level 0.3-0.5 units/ml Monitor platelets by anticoagulation protocol: Yes   Plan:  Cont heparin at 1350 units/hr Recheck heparin level in 8 hours Monitor daily heparin level and CBC while on therapy  Narda Bonds, PharmD, [place] Pharmacist Phone: [phone]

## 2020-10-14 NOTE — Progress Notes (Signed)
Physical Therapy Wound Treatment Patient Details  Name: Candace [name] MRN: [id number] Date of Birth: 1951-10-16  Today's Date: 10/14/2020 Time: 1340-1415 Time Calculation (min): 35 min  Subjective  Subjective: Lethargic on vent. Daughter present at end of session. Patient and Family Stated Goals: Heal wound; maintain pt comfort per daughter Date of Onset:  (Unknown)  Pain Score:  Pt premedicated and was calm with eyes closed and/or sleeping throughout session. Daughter expressed concern with pain control however pt appeared to tolerate well without any signs of distress.   Wound Assessment  Pressure Injury 09/25/20 Sacrum Bilateral;Medial Deep Tissue Pressure Injury - Purple or maroon localized area of discolored intact skin or blood-filled blister due to damage of underlying soft tissue from pressure and/or shear. Purple, non-blanchable, b (Active)  Dressing Type ABD;Barrier Film (skin prep);Gauze (Comment);Moist to dry 10/14/20 1432  Dressing Changed;Clean;Dry;Intact 10/14/20 1432  Dressing Change Frequency Daily 10/14/20 1432  State of Healing Eschar 10/14/20 1432  Site / Wound Assessment Red;Black 10/14/20 1432  % Wound base Red or Granulating 20% 10/14/20 1432  % Wound base Yellow/Fibrinous Exudate 0% 10/14/20 1432  % Wound base Black/Eschar 80% 10/14/20 1432  % Wound base Other/Granulation Tissue (Comment) 0% 10/14/20 1432  Peri-wound Assessment Excoriated 10/14/20 1432  Wound Length (cm) 13 cm 10/13/20 1200  Wound Width (cm) 12.5 cm 10/13/20 1200  Wound Depth (cm) 0.1 cm 10/13/20 1200  Wound Surface Area (cm^2) 162.5 cm^2 10/13/20 1200  Wound Volume (cm^3) 16.25 cm^3 10/13/20 1200  Tunneling (cm) 0 10/13/20 1200  Undermining (cm) 0 10/13/20 1200  Margins Unattached edges (unapproximated) 10/14/20 1432  Drainage Amount None 10/14/20 1432  Drainage Description Serosanguineous 10/14/20 1432  Treatment Debridement (Selective);Hydrotherapy (Pulse lavage);Packing (Saline gauze)  10/14/20 1432   Santyl applied to wound bed prior to applying dressing.     Hydrotherapy Pulsed lavage therapy - wound location: sacrum Pulsed Lavage with Suction (psi): 8 psi (8-12) Pulsed Lavage with Suction - Normal Saline Used: 1000 mL Pulsed Lavage Tip: Tip with splash shield Selective Debridement Selective Debridement - Location: sacrum Selective Debridement - Tools Used: Forceps;Scalpel Selective Debridement - Tissue Removed: eschar   Wound Assessment and Plan  Wound Therapy - Assess/Plan/Recommendations Wound Therapy - Clinical Statement: Debridement continued on areas of eschar however other areas of wound that are still considered a deep tissue injury remain and were not debrided. Anticipate would will evolve and more area will be considered for selective debridement. This patient will benefit from continued hydrotherapy for selective removal of unviable tissue, to decrease bioburden and promote wound bed healing. Wound Therapy - Functional Problem List: Global weakness and immobility Factors Delaying/Impairing Wound Healing: Diabetes Mellitus;Immobility;Multiple medical problems Hydrotherapy Plan: Debridement;Dressing change;Patient/family education;Pulsatile lavage with suction Wound Therapy - Frequency: 6X / week Wound Therapy - Follow Up Recommendations: Skilled nursing facility Wound Plan: See above  Wound Therapy Goals- Improve the function of patient's integumentary system by progressing the wound(s) through the phases of wound healing (inflammation - proliferation - remodeling) by: Decrease Necrotic Tissue to: 20% Decrease Necrotic Tissue - Progress: Progressing toward goal Increase Granulation Tissue to: 80% Increase Granulation Tissue - Progress: Progressing toward goal Goals/treatment plan/discharge plan were made with and agreed upon by patient/family: No, Patient unable to participate in goals/treatment/discharge plan and family unavailable Time For Goal  Achievement: 7 days Wound Therapy - Potential for Goals: Good  Goals will be updated until maximal potential achieved or discharge criteria met.  Discharge criteria: when goals achieved, discharge from hospital, MD decision/surgical intervention, no progress  towards goals, refusal/missing three consecutive treatments without notification or medical reason.  GP     Thelma Comp 10/14/2020, 2:43 PM   Rolinda Roan, PT, DPT Acute Rehabilitation Services Pager: [phone] Office: [phone]

## 2020-10-14 NOTE — Progress Notes (Signed)
[name], MRN:  [id number], DOB:  10/31/1950, LOS: 15 ADMISSION DATE:  08/31/2020, CONSULTATION DATE:  09/07/2020 REFERRING MD:  Dr Candace [name], CHIEF COMPLAINT:  Acute resp failure  Brief History   69 year old female who was previously diagnosed with Covid 08/23/2020.  Admitted 11/2 with AF-RVR, found to have a perforated duodenal underwent exploratory laparotomy with Phillip Heal patch placement.  Past Medical History  Covid pneumonia Atrial fibrillation CKD stage III Diabetes mellitus Hypertension Colon cancer Hyperlipidemia  Significant Hospital Events   11/2 Admitted  11/3 OR -> perforated duodenal ulcer 11/10 progressive hemorrhagic shock, intubated, transfused, pressors, proned; started on CRRT in PM 11/16 Extubated. Re-intubated overnight due to respiratory distress and hypoxia with decreased mentation 11/18  bronch'd/ cultures sent 11/19 hgb down getting blood 09/22/2020 spiked fever resume empirical antimicrobial therapy 11/26: hemorrhagic shock, hgb 5.6, increased pressors, CT A/P  11/30 per palliative "[name] expresses understanding that patient is unfortunately very fragile despite ongoing intensive medical treatment and full mechanical support. She indicates that the family wants to continue with all current interventions despite potential outcomes". 12/8: CRRT discontinued due to clotting 12/9 family requested transfer to tertiary care [facility]). They did not feel that they could offer further treatment and denied transfer  12/10-12/12: CRRT restarted. Continues to have episodes of tachycardia, tachypnea that seem to improve with pain management 12/11 back in shock. Pressor requirements  Up. CXR worse. ABX resumed 12/12 still requiring inc pressors. Had hypoglycemic event. Basal insulin dosing adjusted 12/13 pressor requirements better. Now hyperglycemic. Re-adjusted Glycemic control  12/14 changed dilaudid to 1/2 dosing from day further. Dc vasopressin.  Goals of care  reconfirmed with daughter.  Patient continues to desire aggressive care.  Not open to discussing any other option, patient family continues to be hopeful that she will be discharged to home with full recovery in spite of multiple attempts by staff to prepare them that this is unlikely scenario 12/15 Dilaudid discontinued sending cortisol for ongoing pressor dependence 12/16 serum cortisol less than 20, adding stress dose steroids to see if this helps with get off pressors.  Making small adjustment and as needed Dilaudid, attempting to not go back on Dilaudid infusion Consults:  Cardiology [facility] Nephrology   Procedures:  11/3 Exploratory laparotomy, Phillip Heal patch, lysis of adhesion for duodenal ulceration postop day 6  R PICC 11/5 >> A line 11/9 >> out ETT 11/9 > 11/16, 11/16 >> 09/21/2020 09/21/2020 tracheostomy>> Lt [place] CVL 11/9 >> R IJ trialysis >> out HD catheter 12/1 >>   Significant Diagnostic Tests:  11/3 CT abd/ pelvis > 1. Positive for bowel perforation: Pneumoperitoneum and intermediate density free fluid in the abdomen. Prior total colectomy. The specific site of perforation is unclear-oral contrast present to the proximal jejunum has not obviously leaked. Note that there may be small bowel loops adherent to the ventral abdominal wall along the greater curve of the stomach. 2. Extensive bilateral lower lung pneumonia. No pleural effusion. 3. Other abdominal and pelvic viscera are stable since 2015, including bilateral adrenal adenomas. Chronic renal parapelvic cysts. 4. Aortic Atherosclerosis 11/3 TTE > EF 70-75%, RV not well visualized, mildly reduced RV systolic function 61/60 CT chest/ abd/ pelvis> 1. Interval progression of diffuse bilateral hazy ground-glass airspace opacities with more focal areas of consolidation at the lung bases 2. Trace bilateral pleural effusions. 3. Postsurgical changes the abdomen as detailed above. No evidence for a postoperative abscess, however  evaluation is limited by lack of IV contrast. 4. There is a 1.9 cm  cystic appearing lesion located in the pancreatic body. This was not present on the patient's CT from 2015.  Follow-up with an outpatient contrast enhanced MRI is recommended. 5. The endometrial stripe appears diffusely thickened. Follow-up with pelvic ultrasound is recommended. Aortic Atherosclerosis 11/14 LE doppler studies > + DVT of right posterior tibial and peroneal vein, +dVT of left posterior tibial vein  11/26 CT ABD PEL > liver unremarkable, distended gallbladder with layering tiny gallstones versus sludge, no duct dilatation, mild hyperdensity right upper pole renal collecting system new.  No evidence of retroperitoneal bleeding.  Multifocal lower lobe predominant pulmonary infiltrates/pneumonia.  Small left pleural effusion.  Micro Data:  11/10 MRSA PCR > neg 11/10 BC x 2 > neg 09/22/2020 blood cultures x2>> S epi.  09/22/2020 sputum culture>> MRSA Blood 12/1 >> negative. S.epi 1 out of 2, likely contaminant Respiratory culture 12/13>>>  Antimicrobials:  azithro 11/2 >11/3 Ceftriaxone 11/2  Fluconazole 11/3 Zosyn 11/3 >> 11/7 Vanc 11/10 off Cefepime 11/10 > 11/16 09/22/2020 vancomycin for MRSA PNA >> 12/2  Vanc 12/11> Zosyn 12/11>  Subjective:  No changes   Objective   Blood pressure 119/71, pulse 91, temperature 97.8 F (36.6 C), temperature source Oral, resp. rate (Abnormal) 27, height 5\' 8"  (1.727 m), weight 89 kg, SpO2 100 %.    Vent Mode: PRVC FiO2 (%):  [40 %] 40 % Set Rate:  [28 bmp] 28 bmp Vt Set:  [450 mL] 450 mL PEEP:  [8 cmH20-10 cmH20] 8 cmH20 Plateau Pressure:  [21 cmH20-32 cmH20] 30 cmH20   Intake/Output Summary (Last 24 hours) at 10/14/2020 0845 Last data filed at 10/14/2020 0800 Gross per 24 hour  Intake 3327.79 ml  Output 3342 ml  Net -14.21 ml   Filed Weights   10/12/20 0420 10/13/20 0317 10/14/20 0409  Weight: 85.5 kg 89.7 kg 89 kg   Physical Exam: General 69 year old  black female she remains ventilator dependent, still on Precedex infusion requiring intermittent Dilaudid HEENT normocephalic atraumatic oral membranes moist, tracheostomy unremarkable Pulmonary: Some scattered rhonchi no accessory use currently on PEEP of 8/FiO2 40% Cardiac: Regular rhythm, tachycardic Abdomen: Ostomy patent, pink, tolerating diet. 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, status post tracheostomy Complicated by healthcare associated pneumonia due to MRSA PEEP/FiO2 requirement stable Plan Continue full ventilator support, given her decreased ventilator requirements we may be able to challenge pressure support soon however not convinced she will come off ventilator VAP bundle  PAD protocol with RASS goal -1  Intermittent chest x-ray   Acute metabolic encephalopathy in the setting of renal failure, shock, sepsis Plan Continue oxycodone, Seroquel and clonazepam  Continue current Precedex infusion  As needed Dilaudid, would prefer to not add back drip, nursing reports need for increased Dilaudid during hydrotherapy, will make adjustments   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  Deemed NOT long term HD candidate.  --CRRT resumed on 12/10 Plan CRRT per  nephrology  Duodenal ulcer perforation status post ex lap and Graham's patch --Appreciate CTS assistance and management Plan Continue wound care  .  Paroxysmal A. fib, currently in sinus rhythm --Patient is in sinus rhythm now Plan IV heparin   Diabetes type 2 w/ marked Hyperglycemia. On 12/12 was hypoglycemia; excellent control after re-adjustment  Plan Blood glucose goal 140-180 No change in current Lantus and sliding scale coverage  Indeterminate bilateral DVT Plan IV heparin  History of stage IV colon cancer Status post pulmonary metastatectomies at [facility] + Chemo 1.9 cm cystic appearing lesion located in the pancreatic body Plan If she survives she can go back to [facility] for further evaluation however given her profound deconditioning her functional status will be a limiting factor in any sort of malignancy evaluation   Best practice:  Diet: Tube feeds, these are being tolerated Pain/Anxiety/Delirium protocol (if indicated) Precedex.Marland Kitchen discontinued IV Dilaudid altogether on 12/15), oxycodone per tube, clonazepam, adjusted as needed Dilaudid 12/16 VAP protocol (if indicated): yes DVT prophylaxis: IV heparin GI prophylaxis: Protonix Glucose control: lantus + ssi (last adjusted 12/13) Mobility: Bedrest Code Status: Full Goals of care discussion last completed on 12/14 with daughter, this was a multidisciplinary discussion including nurse practitioner, bedside nurse, and the patient's daughter.  There have been no changes in goals of care, family continues to desire any and all therapies, remains full code, family remains hopeful that she will survive and be discharged to home  Next multidisciplinary goals of care discussion due 12/21    My critical care x32 minutes Erick Colace ACNP-BC [place] Pager # [phone] OR # [phone] if no answer

## 2020-10-14 NOTE — Progress Notes (Signed)
eLink Physician-Brief Progress Note Patient Name: Candace [name] DOB: [birth year] MRN: [id number]   Date of Service  10/14/2020  HPI/Events of Note  Patient is on CRRT, she has developed a sinus tachycardia.  eICU Interventions  Albumin 25 % 25 gm iv x 1.        Kerry Kass Hollie Wojahn 10/14/2020, 10:34 PM

## 2020-10-15 ENCOUNTER — Inpatient Hospital Stay (HOSPITAL_COMMUNITY): Payer: Medicare Other

## 2020-10-15 LAB — RENAL FUNCTION PANEL
Albumin: 2.4 g/dL — ABNORMAL LOW (ref 3.5–5.0)
Albumin: 2.1 g/dL — ABNORMAL LOW (ref 3.5–5.0)
Anion gap: 10 (ref 5–15)
Anion gap: 11 (ref 5–15)
BUN: 33 mg/dL — ABNORMAL HIGH (ref 8–23)
BUN: 44 mg/dL — ABNORMAL HIGH (ref 8–23)
CO2: 24 mmol/L (ref 22–32)
CO2: 24 mmol/L (ref 22–32)
Calcium: 10.3 mg/dL (ref 8.9–10.3)
Calcium: 9.7 mg/dL (ref 8.9–10.3)
Chloride: 98 mmol/L (ref 98–111)
Chloride: 98 mmol/L (ref 98–111)
Creatinine, Ser: 0.63 mg/dL (ref 0.44–1.00)
Creatinine, Ser: 0.64 mg/dL (ref 0.44–1.00)
GFR, Estimated: >60 mL/min (ref >60)
GFR, Estimated: >60 mL/min (ref >60)
Glucose, Bld: 268 mg/dL — ABNORMAL HIGH (ref 70–99)
Glucose, Bld: 281 mg/dL — ABNORMAL HIGH (ref 70–99)
Phosphorus: 3.9 mg/dL (ref 2.5–4.6)
Phosphorus: 4 mg/dL (ref 2.5–4.6)
Potassium: 4.9 mmol/L (ref 3.5–5.1)
Potassium: 4.5 mmol/L (ref 3.5–5.1)
Sodium: 132 mmol/L — ABNORMAL LOW (ref 135–145)
Sodium: 133 mmol/L — ABNORMAL LOW (ref 135–145)

## 2020-10-15 LAB — CBC
HCT: 24.1 % — ABNORMAL LOW (ref 36.0–46.0)
Hemoglobin: 7.3 g/dL — ABNORMAL LOW (ref 12.0–15.0)
MCH: 28.7 pg (ref 26.0–34.0)
MCHC: 30.3 g/dL (ref 30.0–36.0)
MCV: 94.9 fL (ref 80.0–100.0)
Platelets: 206 10*3/uL (ref 150–400)
RBC: 2.54 MIL/uL — ABNORMAL LOW (ref 3.87–5.11)
RDW: 15.6 % — ABNORMAL HIGH (ref 11.5–15.5)
WBC: 15.2 10*3/uL — ABNORMAL HIGH (ref 4.0–10.5)
nRBC: 0.5 % — ABNORMAL HIGH (ref 0.0–0.2)

## 2020-10-15 LAB — CULTURE, BLOOD (ROUTINE X 2)
Culture: NO GROWTH
Culture: NO GROWTH
Special Requests: ADEQUATE

## 2020-10-15 LAB — GLUCOSE, CAPILLARY
Glucose-Capillary: 203 mg/dL — ABNORMAL HIGH (ref 70–99)
Glucose-Capillary: 242 mg/dL — ABNORMAL HIGH (ref 70–99)
Glucose-Capillary: 248 mg/dL — ABNORMAL HIGH (ref 70–99)
Glucose-Capillary: 252 mg/dL — ABNORMAL HIGH (ref 70–99)
Glucose-Capillary: 264 mg/dL — ABNORMAL HIGH (ref 70–99)
Glucose-Capillary: 265 mg/dL — ABNORMAL HIGH (ref 70–99)

## 2020-10-15 LAB — MAGNESIUM: Magnesium: 2.5 mg/dL — ABNORMAL HIGH (ref 1.7–2.4)

## 2020-10-15 LAB — HEPARIN LEVEL (UNFRACTIONATED): Heparin Unfractionated: 0.43 IU/mL (ref 0.30–0.70)

## 2020-10-15 MED ORDER — MIDAZOLAM HCL 2 MG/2ML IJ SOLN
1.0000 mg | INTRAMUSCULAR | Status: DC | PRN
  Administered 2020-10-15 – 2020-10-20 (×16): 2 mg via INTRAVENOUS
  Administered 2020-10-21: 05:00:00 1 mg via INTRAVENOUS
  Administered 2020-10-21 – 2020-10-29 (×8): 2 mg via INTRAVENOUS
  Filled 2020-10-15 (×25): qty 2

## 2020-10-15 MED ORDER — INSULIN GLARGINE 100 UNIT/ML ~~LOC~~ SOLN
35.0000 [IU] | Freq: Every day | SUBCUTANEOUS | Status: DC
  Administered 2020-10-15 – 2020-10-16 (×2): 35 [IU] via SUBCUTANEOUS
  Filled 2020-10-15 (×3): qty 0.35

## 2020-10-15 MED ORDER — HYDROMORPHONE BOLUS VIA INFUSION
0.2000 mg | INTRAVENOUS | Status: DC | PRN
  Administered 2020-10-15 – 2020-10-16 (×4): 0.2 mg via INTRAVENOUS
  Filled 2020-10-15: qty 1

## 2020-10-15 MED ORDER — SODIUM CHLORIDE 0.9 % IV SOLN
0.5000 mg/h | INTRAVENOUS | Status: DC
  Administered 2020-10-15: 02:00:00 0.5 mg/h via INTRAVENOUS
  Administered 2020-10-16: 08:00:00 2 mg/h via INTRAVENOUS
  Filled 2020-10-15 (×4): qty 5

## 2020-10-15 NOTE — Progress Notes (Signed)
[place] for Heparin Indication: atrial fibrillation  Assessment: 69 y.o. female with a history of atrial fibrillation on chronic Eliquis prior to admission which has been held and patient transitioned to IV heparin infusion per pharmacy dosing consult.    12/17 AM update:  Heparin level 0.51 Essentially at goal, will not adjust since previous level was low  Goal of Therapy:  Heparin level 0.3-0.5 units/ml Monitor platelets by anticoagulation protocol: Yes   Plan:  Cont heparin at 1400 units/hr Re-check heparin level with AM labs Monitor daily heparin level and CBC while on therapy  Narda Bonds, PharmD, BCPS Clinical Pharmacist Phone: [phone]

## 2020-10-15 NOTE — Progress Notes (Signed)
Physical Therapy Wound Treatment Patient Details  Name: Candace [name] MRN: [id number] Date of Birth: September 07, 1951  Today's Date: 10/15/2020 Time: 4097-3532 Time Calculation (min): 52 min  Subjective  Subjective: Lethargic on vent. Daughter present for total session. Patient and Family Stated Goals: Heal wound; maintain pt comfort per daughter Date of Onset:  (Unknown)  Pain Score:  premedicated,  Pain 1/10  Wound Assessment  Pressure Injury 09/25/20 Sacrum Bilateral;Medial Deep Tissue Pressure Injury - Purple or maroon localized area of discolored intact skin or blood-filled blister due to damage of underlying soft tissue from pressure and/or shear. Purple, non-blanchable, b (Active)  Wound Image   10/13/20 1334  Dressing Type ABD;Barrier Film (skin prep);Gauze (Comment);Moist to dry;Normal saline moist dressing;Other (Comment) 10/15/20 1808  Dressing Changed;Clean;Dry;Intact 10/15/20 1808  Dressing Change Frequency Daily 10/15/20 1808  State of Healing Eschar 10/15/20 1808  Site / Wound Assessment Brown;Black;Red 10/15/20 1808  % Wound base Red or Granulating 20% 10/15/20 1808  % Wound base Yellow/Fibrinous Exudate 0% 10/14/20 1432  % Wound base Black/Eschar 80% 10/15/20 1808  % Wound base Other/Granulation Tissue (Comment) 0% 10/15/20 1808  Peri-wound Assessment Intact 10/15/20 1808  Wound Length (cm) 13 cm 10/13/20 1200  Wound Width (cm) 12.5 cm 10/13/20 1200  Wound Depth (cm) 0.1 cm 10/13/20 1200  Wound Surface Area (cm^2) 162.5 cm^2 10/13/20 1200  Wound Volume (cm^3) 16.25 cm^3 10/13/20 1200  Tunneling (cm) 0 10/13/20 1200  Undermining (cm) 0 10/13/20 1200  Margins Unattached edges (unapproximated) 10/14/20 1432  Drainage Amount Minimal 10/15/20 1808  Drainage Description Serous;Sanguineous 10/15/20 1808  Treatment Cleansed;Debridement (Selective);Hydrotherapy (Pulse lavage);Packing (Saline gauze);Other (Comment) 10/15/20 1808   Santyl applied to wound bed prior to applying  dressing.    Hydrotherapy Pulsed lavage therapy - wound location: sacrum Pulsed Lavage with Suction (psi): 8 psi (8-12) Pulsed Lavage with Suction - Normal Saline Used: 1000 mL Pulsed Lavage Tip: Tip with splash shield Selective Debridement Selective Debridement - Location: sacrum Selective Debridement - Tools Used: Forceps;Scalpel Selective Debridement - Tissue Removed: eschar   Wound Assessment and Plan  Wound Therapy - Assess/Plan/Recommendations Wound Therapy - Clinical Statement: Debridement continued on areas of eschar however other areas of wound that are still considered a deep tissue injury remain and were not debrided. Anticipate wound will evolve and more area will be considered for selective debridement. This patient will benefit from continued hydrotherapy for selective removal of unviable tissue, to decrease bioburden and promote wound bed healing. Wound Therapy - Functional Problem List: Global weakness and immobility Factors Delaying/Impairing Wound Healing: Diabetes Mellitus;Immobility;Multiple medical problems Hydrotherapy Plan: Debridement;Dressing change;Patient/family education;Pulsatile lavage with suction Wound Therapy - Frequency: 6X / week Wound Therapy - Follow Up Recommendations: Skilled nursing facility Wound Plan: See above  Wound Therapy Goals- Improve the function of patient's integumentary system by progressing the wound(s) through the phases of wound healing (inflammation - proliferation - remodeling) by: Decrease Necrotic Tissue to: 20% Decrease Necrotic Tissue - Progress: Progressing toward goal Increase Granulation Tissue to: 80% Increase Granulation Tissue - Progress: Progressing toward goal Goals/treatment plan/discharge plan were made with and agreed upon by patient/family: No, Patient unable to participate in goals/treatment/discharge plan and family unavailable Time For Goal Achievement: 7 days Wound Therapy - Potential for Goals: Good  Goals  will be updated until maximal potential achieved or discharge criteria met.  Discharge criteria: when goals achieved, discharge from hospital, MD decision/surgical intervention, no progress towards goals, refusal/missing three consecutive treatments without notification or medical reason.  GP   10/15/2020  Ginger Carne., PT Acute Rehabilitation Services [phone]  (pager) [phone]  (office)  Tessie Fass Reighn Kaplan 10/15/2020, 6:13 PM

## 2020-10-15 NOTE — Progress Notes (Signed)
[place] for Heparin Indication: atrial fibrillation  Assessment: 69 y.o. female with a history of atrial fibrillation on chronic Eliquis prior to admission which has been held and patient transitioned to IV heparin infusion per pharmacy dosing consult.   Heparin level therapeutic. Hg slow trend down to 7.3, plt wnl. No bleeding issues reported.  Goal of Therapy:  Heparin level 0.3-0.5 units/ml Monitor platelets by anticoagulation protocol: Yes   Plan:  Continue heparin at 1400 units/hr Monitor daily heparin level/CBC, s/sx bleeding   Arturo Morton, PharmD, BCPS Please check AMION for all Welcome contact numbers Clinical Pharmacist 10/15/2020 9:36 AM

## 2020-10-15 NOTE — Progress Notes (Signed)
eLink Physician-Brief Progress Note Patient Name: Kendall Arnell DOB: Mar 21, 1951 MRN: [id number]   Date of Service  10/15/2020  HPI/Events of Note  Heart rate in the 140's and respiratory rate in the 50's likely reflecting sub-optimal sedation and narcotic withdrawal.  eICU Interventions  Dilaudid infusion reoredered, along with PRN versed 1-2 mg iv Q 2 hours PRN.        Kerry Kass Markesha Hannig 10/15/2020, 12:36 AM

## 2020-10-15 NOTE — Progress Notes (Signed)
NAMELezli [name], MRN:  [id number], DOB:  1951/09/18, LOS: 56 ADMISSION DATE:  08/31/2020, CONSULTATION DATE:  09/07/2020 REFERRING MD:  Dr Candiss Norse, CHIEF COMPLAINT:  Acute resp failure  Brief History   69 year old female who was previously diagnosed with Covid 08/23/2020.  Admitted 11/2 with AF-RVR, found to have a perforated duodenal underwent exploratory laparotomy with Phillip Heal patch placement.  Past Medical History  Covid pneumonia Atrial fibrillation CKD stage III Diabetes mellitus Hypertension Colon cancer Hyperlipidemia  Significant Hospital Events   11/2 Admitted  11/3 OR -> perforated duodenal ulcer 11/10 progressive hemorrhagic shock, intubated, transfused, pressors, proned; started on CRRT in PM 11/16 Extubated. Re-intubated overnight due to respiratory distress and hypoxia with decreased mentation 11/18  bronch'd/ cultures sent 11/19 hgb down getting blood 09/22/2020 spiked fever resume empirical antimicrobial therapy 11/26: hemorrhagic shock, hgb 5.6, increased pressors, CT A/P  11/30 per palliative "[name] expresses understanding that patient is unfortunately very fragile despite ongoing intensive medical treatment and full mechanical support. She indicates that the family wants to continue with all current interventions despite potential outcomes". 12/8: CRRT discontinued due to clotting 12/9 family requested transfer to tertiary care [facility]). They did not feel that they could offer further treatment and denied transfer  12/10-12/12: CRRT restarted. Continues to have episodes of tachycardia, tachypnea that seem to improve with pain management 12/11 back in shock. Pressor requirements  Up. CXR worse. ABX resumed 12/12 still requiring inc pressors. Had hypoglycemic event. Basal insulin dosing adjusted 12/13 pressor requirements better. Now hyperglycemic. Re-adjusted Glycemic control  12/14 changed dilaudid to 1/2 dosing from day further. Dc vasopressin.  Goals of care  reconfirmed with daughter.  Patient continues to desire aggressive care.  Not open to discussing any other option, patient family continues to be hopeful that she will be discharged to home with full recovery in spite of multiple attempts by staff to prepare them that this is unlikely scenario 12/15 Dilaudid discontinued sending cortisol for ongoing pressor dependence 12/16 serum cortisol less than 20, adding stress dose steroids to see if this helps with get off pressors.  Making small adjustment and as needed Dilaudid, attempting to not go back on Dilaudid infusion 12/17: Developed worsening tachycardia during the evening hours received initially IV albumin, followed by resuming IV Dilaudid with question of suboptimal pain control.  Currently looks better back on Dilaudid drip.  We have been able to wean pressors after adding stress dose steroids Consults:  Cardiology [facility] Nephrology   Procedures:  11/3 Exploratory laparotomy, Phillip Heal patch, lysis of adhesion for duodenal ulceration postop day 6  R PICC 11/5 >> A line 11/9 >> out ETT 11/9 > 11/16, 11/16 >> 09/21/2020 09/21/2020 tracheostomy>> Lt [place] CVL 11/9 >> R IJ trialysis >> out HD catheter 12/1 >>   Significant Diagnostic Tests:  11/3 CT abd/ pelvis > 1. Positive for bowel perforation: Pneumoperitoneum and intermediate density free fluid in the abdomen. Prior total colectomy. The specific site of perforation is unclear-oral contrast present to the proximal jejunum has not obviously leaked. Note that there may be small bowel loops adherent to the ventral abdominal wall along the greater curve of the stomach. 2. Extensive bilateral lower lung pneumonia. No pleural effusion. 3. Other abdominal and pelvic viscera are stable since 2015, including bilateral adrenal adenomas. Chronic renal parapelvic cysts. 4. Aortic Atherosclerosis 11/3 TTE > EF 70-75%, RV not well visualized, mildly reduced RV systolic function 37/62 CT chest/ abd/  pelvis> 1. Interval progression of diffuse bilateral hazy ground-glass airspace opacities  with more focal areas of consolidation at the lung bases 2. Trace bilateral pleural effusions. 3. Postsurgical changes the abdomen as detailed above. No evidence for a postoperative abscess, however evaluation is limited by lack of IV contrast. 4. There is a 1.9 cm cystic appearing lesion located in the pancreatic body. This was not present on the patient's CT from 2015.  Follow-up with an outpatient contrast enhanced MRI is recommended. 5. The endometrial stripe appears diffusely thickened. Follow-up with pelvic ultrasound is recommended. Aortic Atherosclerosis 11/14 LE doppler studies > + DVT of right posterior tibial and peroneal vein, +dVT of left posterior tibial vein  11/26 CT ABD PEL > liver unremarkable, distended gallbladder with layering tiny gallstones versus sludge, no duct dilatation, mild hyperdensity right upper pole renal collecting system new.  No evidence of retroperitoneal bleeding.  Multifocal lower lobe predominant pulmonary infiltrates/pneumonia.  Small left pleural effusion.  Micro Data:  11/10 MRSA PCR > neg 11/10 BC x 2 > neg 09/22/2020 blood cultures x2>> S epi.  09/22/2020 sputum culture>> MRSA Blood 12/1 >> negative. S.epi 1 out of 2, likely contaminant Respiratory culture 12/13>>>  Antimicrobials:  azithro 11/2 >11/3 Ceftriaxone 11/2  Fluconazole 11/3 Zosyn 11/3 >> 11/7 Vanc 11/10 off Cefepime 11/10 > 11/16 09/22/2020 vancomycin for MRSA PNA >> 12/2  Vanc 12/11> Zosyn 12/11>  Subjective:  Looks about the same  Objective   Blood pressure (Abnormal) 96/55, pulse (Abnormal) 108, temperature 98.3 F (36.8 C), temperature source Axillary, resp. rate (Abnormal) 22, height 5\' 8"  (1.727 m), weight 81.8 kg, SpO2 100 %.    Vent Mode: PRVC FiO2 (%):  [40 %] 40 % Set Rate:  [28 bmp] 28 bmp Vt Set:  [450 mL] 450 mL PEEP:  [5 cmH20-8 cmH20] 8 cmH20 Plateau Pressure:  [33  cmH20] 33 cmH20   Intake/Output Summary (Last 24 hours) at 10/15/2020 0955 Last data filed at 10/15/2020 0900 Gross per 24 hour  Intake 3201.12 ml  Output 4265 ml  Net -1063.88 ml   Filed Weights   10/14/20 0409 10/15/20 0000 10/15/20 0500  Weight: 89 kg 81.8 kg 81.8 kg   Physical Exam: General 69 year old black female she remains ventilator dependent HEENT normocephalic atraumatic tracheostomy tube is unremarkable Pulmonary: Some mild accessory use today scattered rhonchi ventilator settings unchanged Cardiac: Tachycardic, currently sinus, no murmur rub or gallop Abdomen: Soft, tolerating tube feeds.  Mid abdominal dressing intact.  Ostomy unremarkable Extremities: Diffuse anasarca, pulses palpable, warm to touch. Neuro: Eyes open, diffuse weakness.  Less interactive today.    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 chest x-ray  Recurrent septic shock 12/11-->working dx recurrent HCAP c/b adrenal insuff Plan  Day 7 of 7 vanc and zosyn No change in midodrine (20mg  via tube tid) Cont stress dose steroids day 2 Wean norepi  Acute metabolic encephalopathy in the setting of renal failure, shock,  sepsis -back on dilaudid gtt 12/17 Plan Continue current  oxycodone, Seroquel, and clonazepam  No change in Precedex  We have added back Dilaudid drip as well as as needed there was concern about possible narcotic withdrawal triggering her tachycardia last night  We will continue current dosing for now, with plan to very slowly titrate down again over the weekend  Might need to consider methadone    AKI, oliguric/anuric --Nephrology consulted. Deemed NOT long term HD candidate.  --CRRT resumed on 12/10 Plan CRRT   Duodenal ulcer perforation status post ex lap and Graham's patch --Appreciate CTS assistance and management Plan Continue wound care .  Paroxysmal A. fib, currently in sinus rhythm --Sinus tachycardia 12/17 (? Pain) Plan IV heparin Treat pain  Diabetes type 2 w/ marked Hyperglycemia.  Much worse on steroids Plan Blood glucose goal 140-180  Continuing sliding scale insulin  Adjusted Lantus, glucose spike after adding stress dose steroids    Indeterminate bilateral DVT Plan IV heparin  History of stage IV colon cancer Status post pulmonary [facility]ctomies at [facility] + Chemo 1.9 cm cystic appearing lesion located in the pancreatic body Plan Oncology follow-up if survives   Best practice:  Diet: Tube feeds, these are being tolerated Pain/Anxiety/Delirium protocol (if indicated) Precedex.., oxycodone per tube, clonazepam, adjusted as needed Dilaudid 12/16 but had to add back IV dilaudid 12/16 PM hours VAP protocol (if indicated): yes DVT prophylaxis: IV heparin GI prophylaxis: Protonix Glucose control: lantus + ssi (last adjusted 12/16) Mobility: Bedrest Code Status: Full Goals of care discussion last completed on 12/14 with daughter, this was a multidisciplinary discussion including nurse practitioner, bedside nurse, and the patient's daughter.  There have been no changes in goals of care, family continues to desire any and all therapies, remains full code, family remains hopeful that she will survive and be discharged  to home  Next multidisciplinary goals of care discussion due 12/21    My critical care x31 min Erick Colace ACNP-BC [place] Pager # [phone] OR # [phone] if no answer

## 2020-10-15 NOTE — Progress Notes (Signed)
Patient ID: Candace [name], female   DOB: [birth year], 69 y.o.   MRN: [id number]  [facility] Progress Note   Assessment/ Plan:   1. Acute kidney Injury: Likely from ATN associated with multifactorial shock-septic shock; MRSA pneumonia as well as hemorrhagic shock.  She remains anuric and without any evidence of renal recovery (on renal replacement therapy since 09/08/2020); given hypercatabolic rate, we will continue CRRT at the current prescription.  Plan to transition to intermittent hemodialysis when no longer needing pressors. 2.  Septic shock secondary to MRSA pneumonia: Status post completion of intravenous vancomycin and ongoing support with (intermittent pressors and) midodrine 20 mg 3 times a day. 3.  Perforated duodenal ulcer with acute blood loss anemia: Status post exploratory laparotomy and Graham patch placement for definitive management.  Continue Aranesp at this time. 4.  Acute hypoxic/hypercapnic respiratory failure: Secondary to ARDS from COVID-19 infection, remains ventilator dependent. 5.  Severe protein calorie malnutrition: On enteral feeding.  Subjective:   Without acute events overnight, transiently on pressors overnight-none this morning   Objective:   BP (!) 96/55   Pulse (!) 108   Temp 98.5 F (36.9 C) (Axillary)   Resp (!) 22   Ht 5' 8"  (1.727 m)   Wt 81.8 kg   SpO2 100%   BMI 27.42 kg/m   Intake/Output Summary (Last 24 hours) at 10/15/2020 0850 Last data filed at 10/15/2020 0800 Gross per 24 hour  Intake 3344 ml  Output 4252 ml  Net -908 ml   Weight change: -7.2 kg  Physical Exam: Gen: Sleeping comfortably, on ventilator via tracheostomy.  On CRRT. CVS: Pulse irregular tachycardia, S1 and S2 normal. right subclavian hemodialysis catheter. Resp: Coarse breath sounds bilaterally without distinct rales or rhonchi Abd: Soft, midline dressing is clean and intact.  RLQ colostomy bag in place. Ext: 1+ upper extremity edema, trace lower  extremity/dependent edema.  Imaging: No results found.  Labs: BMET Recent Labs  Lab 10/12/20 0338 10/12/20 1624 10/13/20 0319 10/13/20 1659 10/14/20 0320 10/14/20 1755 10/15/20 0500  NA 133* 132* 134* 131* 132* 132* 132*  K 4.7 4.8 4.7 4.9 5.2* 5.1 4.9  CL 100 99 99 100 99 100 98  CO2 23 25 25 24 24 23 24   GLUCOSE 135* 192* 210* 215* 153* 251* 268*  BUN 20 21 23 23 22  28* 33*  CREATININE 0.38* 0.50 0.53 0.56 0.50 0.54 0.63  CALCIUM 9.6 10.2 10.1 9.9 9.7 9.6 10.3  PHOS 3.3 3.3 3.4 3.6 3.2 3.7 3.9   CBC Recent Labs  Lab 10/12/20 0338 10/13/20 0319 10/14/20 0320 10/15/20 0500  WBC 12.7* 12.8* 16.5* 15.2*  HGB 7.8* 7.6* 7.7* 7.3*  HCT 25.3* 24.8* 25.9* 24.1*  MCV 98.1 96.5 97.4 94.9  PLT 232 192 210 206    Medications:    . B-complex with vitamin C  1 tablet Per Tube Daily  . chlorhexidine gluconate (MEDLINE KIT)  15 mL Mouth Rinse BID  . Chlorhexidine Gluconate Cloth  6 each Topical Daily  . clonazepam  1 mg Per Tube BID  . collagenase   Topical Daily  . darbepoetin (ARANESP) injection - DIALYSIS  60 mcg Subcutaneous Q Tue-1800  . docusate  100 mg Per Tube BID  . feeding supplement (PROSource TF)  90 mL Per Tube TID  . hydrocortisone sod succinate (SOLU-CORTEF) inj  50 mg Intravenous Q6H  . insulin aspart  0-15 Units Subcutaneous Q4H  . insulin aspart  5 Units Subcutaneous Q4H  . insulin glargine  25 Units Subcutaneous Daily  . mouth rinse  15 mL Mouth Rinse QID  . midodrine  20 mg Per Tube TID WC  . nutrition supplement (JUVEN)  1 packet Per Tube BID BM  . oxyCODONE  5 mg Per Tube Q8H  . pantoprazole sodium  40 mg Per Tube BID  . polyethylene glycol  17 g Per Tube Daily  . QUEtiapine  100 mg Per Tube BID  . sodium chloride flush  10-40 mL Intracatheter Q12H   Elmarie Shiley, MD 10/15/2020, 8:50 AM

## 2020-10-15 NOTE — Progress Notes (Signed)
°   10/15/20 1833  Clinical Encounter Type  Visited With Patient not available  Visit Type Code  Referral From Nurse  Consult/Referral To Chaplain  Chaplain responded to code. The patient's family was not present.This note was prepared by Jeanine Luz, M.Div..  For questions please contact by phone [phone].

## 2020-10-16 LAB — RENAL FUNCTION PANEL
Albumin: 2.4 g/dL — ABNORMAL LOW (ref 3.5–5.0)
Albumin: 2.5 g/dL — ABNORMAL LOW (ref 3.5–5.0)
Anion gap: 8 (ref 5–15)
Anion gap: 11 (ref 5–15)
BUN: 51 mg/dL — ABNORMAL HIGH (ref 8–23)
BUN: 51 mg/dL — ABNORMAL HIGH (ref 8–23)
CO2: 24 mmol/L (ref 22–32)
CO2: 24 mmol/L (ref 22–32)
Calcium: 9.8 mg/dL (ref 8.9–10.3)
Calcium: 10.4 mg/dL — ABNORMAL HIGH (ref 8.9–10.3)
Chloride: 97 mmol/L — ABNORMAL LOW (ref 98–111)
Chloride: 97 mmol/L — ABNORMAL LOW (ref 98–111)
Creatinine, Ser: 0.62 mg/dL (ref 0.44–1.00)
Creatinine, Ser: 0.67 mg/dL (ref 0.44–1.00)
GFR, Estimated: >60 mL/min (ref >60)
GFR, Estimated: >60 mL/min (ref >60)
Glucose, Bld: 346 mg/dL — ABNORMAL HIGH (ref 70–99)
Glucose, Bld: 308 mg/dL — ABNORMAL HIGH (ref 70–99)
Phosphorus: 4.2 mg/dL (ref 2.5–4.6)
Phosphorus: 3.9 mg/dL (ref 2.5–4.6)
Potassium: 5.4 mmol/L — ABNORMAL HIGH (ref 3.5–5.1)
Potassium: 4 mmol/L (ref 3.5–5.1)
Sodium: 129 mmol/L — ABNORMAL LOW (ref 135–145)
Sodium: 132 mmol/L — ABNORMAL LOW (ref 135–145)

## 2020-10-16 LAB — CBC
HCT: 24.6 % — ABNORMAL LOW (ref 36.0–46.0)
Hemoglobin: 7.3 g/dL — ABNORMAL LOW (ref 12.0–15.0)
MCH: 28.5 pg (ref 26.0–34.0)
MCHC: 29.7 g/dL — ABNORMAL LOW (ref 30.0–36.0)
MCV: 96.1 fL (ref 80.0–100.0)
Platelets: 249 10*3/uL (ref 150–400)
RBC: 2.56 MIL/uL — ABNORMAL LOW (ref 3.87–5.11)
RDW: 15.7 % — ABNORMAL HIGH (ref 11.5–15.5)
WBC: 16.5 10*3/uL — ABNORMAL HIGH (ref 4.0–10.5)
nRBC: 0.8 % — ABNORMAL HIGH (ref 0.0–0.2)

## 2020-10-16 LAB — GLUCOSE, CAPILLARY
Glucose-Capillary: 206 mg/dL — ABNORMAL HIGH (ref 70–99)
Glucose-Capillary: 216 mg/dL — ABNORMAL HIGH (ref 70–99)
Glucose-Capillary: 247 mg/dL — ABNORMAL HIGH (ref 70–99)
Glucose-Capillary: 251 mg/dL — ABNORMAL HIGH (ref 70–99)
Glucose-Capillary: 296 mg/dL — ABNORMAL HIGH (ref 70–99)
Glucose-Capillary: 323 mg/dL — ABNORMAL HIGH (ref 70–99)

## 2020-10-16 LAB — ZINC: Zinc: 149 ug/dL — ABNORMAL HIGH (ref 44–115)

## 2020-10-16 LAB — MAGNESIUM: Magnesium: 2.6 mg/dL — ABNORMAL HIGH (ref 1.7–2.4)

## 2020-10-16 LAB — HEPARIN LEVEL (UNFRACTIONATED)
Heparin Unfractionated: 0.64 IU/mL (ref 0.30–0.70)
Heparin Unfractionated: 0.54 IU/mL (ref 0.30–0.70)

## 2020-10-16 LAB — COPPER, SERUM: Copper: 20 ug/dL — ABNORMAL LOW (ref 80–158)

## 2020-10-16 MED ORDER — HYDROMORPHONE HCL 1 MG/ML IJ SOLN
1.0000 mg | Freq: Once | INTRAMUSCULAR | Status: AC
  Administered 2020-10-16: 06:00:00 1 mg via INTRAVENOUS

## 2020-10-16 MED ORDER — OXYCODONE HCL 5 MG PO TABS
10.0000 mg | ORAL_TABLET | Freq: Three times a day (TID) | ORAL | Status: DC
Start: 2020-10-16 — End: 2020-10-17
  Administered 2020-10-16 – 2020-10-17 (×3): 10 mg
  Filled 2020-10-16 (×3): qty 2

## 2020-10-16 MED ORDER — CHLORHEXIDINE GLUCONATE CLOTH 2 % EX PADS
6.0000 | MEDICATED_PAD | Freq: Every day | CUTANEOUS | Status: DC
  Administered 2020-10-16 – 2020-10-23 (×8): 6 via TOPICAL

## 2020-10-16 MED ORDER — HYDROCORTISONE NA SUCCINATE PF 100 MG IJ SOLR
50.0000 mg | Freq: Two times a day (BID) | INTRAMUSCULAR | Status: DC
  Administered 2020-10-16 – 2020-10-20 (×8): 50 mg via INTRAVENOUS
  Filled 2020-10-16 (×7): qty 2

## 2020-10-16 MED ORDER — SODIUM ZIRCONIUM CYCLOSILICATE 10 G PO PACK
10.0000 g | PACK | Freq: Two times a day (BID) | ORAL | Status: AC
  Administered 2020-10-16 (×2): 10 g
  Filled 2020-10-16 (×2): qty 1

## 2020-10-16 MED ORDER — ALBUMIN HUMAN 25 % IV SOLN
25.0000 g | Freq: Once | INTRAVENOUS | Status: AC
  Administered 2020-10-16: 02:00:00 25 g via INTRAVENOUS
  Filled 2020-10-16: qty 100

## 2020-10-16 NOTE — Progress Notes (Addendum)
[place] for Heparin Indication: atrial fibrillation  Assessment: 69 y.o. female with a history of atrial fibrillation on chronic Eliquis prior to admission which has been held and patient transitioned to IV heparin infusion per pharmacy dosing consult.   Heparin level increased above goal at 0.64.  Hg slow trend down to 7.3 - stable from yesterday. Platelets are within normal limits. No bleeding issues reported.  Goal of Therapy:  Heparin level 0.3-0.5 units/ml Monitor platelets by anticoagulation protocol: Yes   Plan:  Reduce heparin to 1300 units/hr Recheck in 8 hours. Monitor daily heparin level/CBC, s/sx bleeding   Sloan Leiter, PharmD, BCPS, BCCCP Clinical Pharmacist Please refer to [facility] for [place] numbers 10/16/2020 7:46 AM

## 2020-10-16 NOTE — Progress Notes (Signed)
NAMEKaysen [name], MRN:  [id number], DOB:  30-Oct-1951, LOS: 29 ADMISSION DATE:  08/31/2020, CONSULTATION DATE:  09/07/2020 REFERRING MD:  Dr Candiss Norse, CHIEF COMPLAINT:  Acute resp failure  Brief History   69 year old female who was previously diagnosed with Covid 08/23/2020.  Admitted 11/2 with AF-RVR, found to have a perforated duodenal underwent exploratory laparotomy with Phillip Heal patch placement.  Past Medical History  Covid pneumonia Atrial fibrillation CKD stage III Diabetes mellitus Hypertension Colon cancer Hyperlipidemia  Significant Hospital Events   11/2 Admitted  11/3 OR -> perforated duodenal ulcer 11/10 progressive hemorrhagic shock, intubated, transfused, pressors, proned; started on CRRT in PM 11/16 Extubated. Re-intubated overnight due to respiratory distress and hypoxia with decreased mentation 11/18  bronch'd/ cultures sent 11/19 hgb down getting blood 09/22/2020 spiked fever resume empirical antimicrobial therapy 11/26: hemorrhagic shock, hgb 5.6, increased pressors, CT A/P  11/30 per palliative "[name] expresses understanding that patient is unfortunately very fragile despite ongoing intensive medical treatment and full mechanical support. She indicates that the family wants to continue with all current interventions despite potential outcomes". 12/8: CRRT discontinued due to clotting 12/9 family requested transfer to tertiary care [facility]). They did not feel that they could offer further treatment and denied transfer  12/10-12/12: CRRT restarted. Continues to have episodes of tachycardia, tachypnea that seem to improve with pain management 12/11 back in shock. Pressor requirements  Up. CXR worse. ABX resumed 12/12 still requiring inc pressors. Had hypoglycemic event. Basal insulin dosing adjusted 12/13 pressor requirements better. Now hyperglycemic. Re-adjusted Glycemic control  12/14 changed dilaudid to 1/2 dosing from day further. Dc vasopressin.  Goals of care  reconfirmed with daughter.  Patient continues to desire aggressive care.  Not open to discussing any other option, patient family continues to be hopeful that she will be discharged to home with full recovery in spite of multiple attempts by staff to prepare them that this is unlikely scenario 12/15 Dilaudid discontinued sending cortisol for ongoing pressor dependence 12/16 serum cortisol less than 20, adding stress dose steroids to see if this helps with get off pressors.  Making small adjustment and as needed Dilaudid, attempting to not go back on Dilaudid infusion 12/17: Developed worsening tachycardia during the evening hours received initially IV albumin, followed by resuming IV Dilaudid with question of suboptimal pain control.  Currently looks better back on Dilaudid drip.  We have been able to wean pressors after adding stress dose steroids; near arrest - bradycardia, better with atropine   Consults:  Cardiology [facility] Nephrology   Procedures:  11/3 Exploratory laparotomy, Phillip Heal patch, lysis of adhesion for duodenal ulceration postop day 6  R PICC 11/5 >> A line 11/9 >> out ETT 11/9 > 11/16, 11/16 >> 09/21/2020 09/21/2020 tracheostomy>> Lt Duncan CVL 11/9 >> R IJ trialysis >> out HD catheter 12/1 >>   Significant Diagnostic Tests:  11/3 CT abd/ pelvis > 1. Positive for bowel perforation: Pneumoperitoneum and intermediate density free fluid in the abdomen. Prior total colectomy. The specific site of perforation is unclear-oral contrast present to the proximal jejunum has not obviously leaked. Note that there may be small bowel loops adherent to the ventral abdominal wall along the greater curve of the stomach. 2. Extensive bilateral lower lung pneumonia. No pleural effusion. 3. Other abdominal and pelvic viscera are stable since 2015, including bilateral adrenal adenomas. Chronic renal parapelvic cysts. 4. Aortic Atherosclerosis 11/3 TTE > EF 70-75%, RV not well visualized, mildly  reduced RV systolic function 28/41 CT chest/ abd/ pelvis> 1.  Interval progression of diffuse bilateral hazy ground-glass airspace opacities with more focal areas of consolidation at the lung bases 2. Trace bilateral pleural effusions. 3. Postsurgical changes the abdomen as detailed above. No evidence for a postoperative abscess, however evaluation is limited by lack of IV contrast. 4. There is a 1.9 cm cystic appearing lesion located in the pancreatic body. This was not present on the patient's CT from 2015.  Follow-up with an outpatient contrast enhanced MRI is recommended. 5. The endometrial stripe appears diffusely thickened. Follow-up with pelvic ultrasound is recommended. Aortic Atherosclerosis 11/14 LE doppler studies > + DVT of right posterior tibial and peroneal vein, +dVT of left posterior tibial vein  11/26 CT ABD PEL > liver unremarkable, distended gallbladder with layering tiny gallstones versus sludge, no duct dilatation, mild hyperdensity right upper pole renal collecting system new.  No evidence of retroperitoneal bleeding.  Multifocal lower lobe predominant pulmonary infiltrates/pneumonia.  Small left pleural effusion.  Micro Data:  11/10 MRSA PCR > neg 11/10 BC x 2 > neg 09/22/2020 blood cultures x2>> S epi.  09/22/2020 sputum culture>> MRSA Blood 12/1 >> negative. S.epi 1 out of 2, likely contaminant BCx 2 12/12 >> neg  Antimicrobials:  azithro 11/2 >11/3 Ceftriaxone 11/2  Fluconazole 11/3 Zosyn 11/3 >> 11/7 Vanc 11/10 off Cefepime 11/10 > 11/16 09/22/2020 vancomycin for MRSA PNA >> 12/2  Vanc 12/11> 12/17 Zosyn 12/11> 12/17  Subjective:  Afebrile  Remains on dilaudid and heparin gtt, dilaudid gtt increased overnight for concern of pain/ ongoing tachycardia, no other events  NE and precedex off 12/17 Ongoing CRRT- even UF, dosing lokelmia this morning  Objective   Blood pressure (!) 111/55, pulse (!) 122, temperature 98.9 F (37.2 C), temperature source Oral,  resp. rate (!) 25, height 5\' 8"  (1.727 m), weight 80.5 kg, SpO2 100 %.    Vent Mode: PRVC FiO2 (%):  [40 %] 40 % Set Rate:  [28 bmp] 28 bmp Vt Set:  [450 mL] 450 mL PEEP:  [8 cmH20] 8 cmH20 Plateau Pressure:  [29 cmH20] 29 cmH20   Intake/Output Summary (Last 24 hours) at 10/16/2020 1024 Last data filed at 10/16/2020 0900 Gross per 24 hour  Intake 2812.89 ml  Output 3111 ml  Net -298.11 ml   Filed Weights   10/15/20 0000 10/15/20 0500 10/16/20 0400  Weight: 81.8 kg 81.8 kg 80.5 kg   Physical Exam: General:  Chronically ill appearing female in NAD HEENT: MM pink/moist, pupils 4/reactive, anicteric, midline trach, left cortrak  Neuro: Awakens to verbal, follows simple commands, able to wiggle toes and squeeze hands, attempts to nod- shakes no to complaints of pain CV: rr, ST, no murmur PULM:  Non labored, MV, coarse  GI: soft, bs +, ostomy with brown output, dry abd dressing   Extremities: warm/dry, anasarca, +2 pulses  Skin: no rashes   No CXR  -140/ net +2.6 WT 81.8-> 20.5 Labs reviewed- remains hyperglycemic, H/H stable, WBC trend stable      Previously hemorrhagic shock requiring PRBCs, Septic shock, MRSA pneumonia s/p 8 days abx  Assessment & Plan:   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, status post tracheostomy Complicated by healthcare associated pneumonia due to MRSA Continue full MV support Continue to slowly wean PEEP/ FiO2 remains 40% No attempts at SBT trials today  CXR in am  VAP RASS goal -1/-2 with dilaudid gtt/ enteral sedation  Volume removal per nephrology  Recurrent septic shock 12/11-->working dx recurrent HCAP c/b adrenal insuff Plan  Completed 7 days of vanc and zosyn 12/17 Ongoing leukocytosis, stable trend, but remains afebrile, likely exacerbated by steroids Culture data negative thus far Off NE  Start weaning stress dose steroids today  Continue midodrine 20 mg TID  Acute metabolic  encephalopathy in the setting of renal failure, shock, sepsis -back on dilaudid gtt 12/17 Plan Continue current Seroquel, clonazepam, increase oxy to 10mg  q 8 hr to wean dilaudid gtt (restarted 12/17 due to concern of possible narcotic withdrawal/ tachycardia)  Off precedex  Continue neuro checks   AKI, oliguric/anuric Hyperkalemia  --Nephrology consulted. Deemed NOT long term HD candidate.  --CRRT started on 11/10; held 12/8; resumed on 12/10 Plan CRRT per nephrology  Lokelmia per nephrology   Duodenal ulcer perforation status post ex lap and Graham's patch --Appreciate CTS assistance and management Plan Continue wound care   Paroxysmal A. fib, currently in sinus rhythm --Sinus tachycardia 12/17 (? Pain) Plan IV heparin Trending H/H- stable, no evidence of bleeding   Diabetes type 2 w/ marked Hyperglycemia.  Much worse on steroids Plan Blood glucose goal 140-180  Continuing sliding scale insulin moderate Adjusted Lantus 12/18, glucose spike after adding stress dose steroids  Has been sensitive to changes Continue TF coverage 5 units q 4 Weaning steroids today which should help   Indeterminate bilateral DVT Plan IV heparin  History of stage IV colon cancer Status post [facility] + Chemo 1.9 cm cystic appearing lesion located in the pancreatic body Plan Oncology follow-up if survives  Best practice:  Diet: Tube feeds, these are being tolerated Pain/Anxiety/Delirium protocol (if indicated) dilaudid gtt/ enteral sedation-  clonazepam, oxy, seroquel VAP protocol (if indicated): yes DVT prophylaxis: IV heparin GI prophylaxis: Protonix Glucose control: SSI, TF coverage, lantus Mobility: Bedrest Code Status: Full Goals of care discussion last completed on 12/14 with daughter, this was a multidisciplinary discussion including nurse practitioner, bedside nurse, and the patient's daughter.  There have been no changes in goals of care, family  continues to desire any and all therapies, remains full code, family remains hopeful that she will survive and be discharged to home  Next multidisciplinary goals of care discussion due 12/21  Daughter, Briant Cedar asking to speak to physician specifically 12/18 at [phone]  CCT: 8 mins  Kennieth Rad, ACNP [facility] 10/16/2020, 10:24 AM

## 2020-10-16 NOTE — Progress Notes (Signed)
eLink Physician-Brief Progress Note Patient Name: Tawsha Terrero DOB: 1951-08-07 MRN: [id number]   Date of Service  10/16/2020  HPI/Events of Note  Persistent sinus tachycardia despite a fluid bolus.  eICU Interventions  Dilaudid 1 mg iv x 1 to try to r/o pain as etiology of sinus tachycardia.        Kerry Kass Avin Gibbons 10/16/2020, 6:02 AM

## 2020-10-16 NOTE — Progress Notes (Signed)
[place] Progress Note Patient Name: Zen Felling DOB: May 24, 1951 MRN: [id number]   Date of Service  10/16/2020  HPI/Events of Note  K+ 5.4  eICU Interventions  Patient is receiving CRRT, no other intervention indicated.        Kerry Kass Shabree Tebbetts 10/16/2020, 4:22 AM

## 2020-10-16 NOTE — Progress Notes (Signed)
Updated patient's daughter Ms. Loanne Drilling

## 2020-10-16 NOTE — Progress Notes (Signed)
Patient ID: Candace [name], female   DOB: Jun 25, 1951, 69 y.o.   MRN: [id number]  [facility] Progress Note   Assessment/ Plan:   1. Acute kidney Injury: Likely from ATN associated with multifactorial shock-septic shock; MRSA pneumonia as well as hemorrhagic shock.  She remains anuric and without any evidence of renal recovery (on renal replacement therapy since 09/08/2020); given hypercatabolic rate, we will continue CRRT at the current prescription.  We will treat hyperkalemia with insulin to correct hyperglycemia and Lokelma via NGT.  Although off pressors, does not appear to be stable enough for intermittent hemodialysis. 2.  Septic shock secondary to MRSA pneumonia: Status post completion of intravenous vancomycin and ongoing support with (intermittent pressors and) midodrine 20 mg 3 times a day. 3.  Perforated duodenal ulcer with acute blood loss anemia: Status post exploratory laparotomy and Graham patch placement for definitive management.  Continue Aranesp at this time. 4.  Acute hypoxic/hypercapnic respiratory failure: Secondary to ARDS from COVID-19 infection, remains ventilator dependent. 5.  Severe protein calorie malnutrition: On enteral feeding.  Subjective:   Had asystolic arrest briefly with successful resuscitation while CRRT filter was being changed.  Hyperkalemia noted on labs this morning along with hyperglycemia.   Objective:   BP (!) 116/56   Pulse (!) 122   Temp 98.9 F (37.2 C) (Oral)   Resp (!) 24   Ht _0  (1.727 m)   Wt 80.5 kg   SpO2 100%   BMI 26.98 kg/m   Intake/Output Summary (Last 24 hours) at 10/16/2020 0915 Last data filed at 10/16/2020 0800 Gross per 24 hour  Intake 2972.12 ml  Output 3147 ml  Net -174.88 ml   Weight change: -1.3 kg  Physical Exam: Gen: Sleeping comfortably, on ventilator via tracheostomy.  On CRRT. CVS: Pulse irregular tachycardia, S1 and S2 normal. right subclavian hemodialysis catheter. Resp: Coarse breath  sounds bilaterally without distinct rales or rhonchi Abd: Soft, midline dressing is clean and intact.  RLQ colostomy bag in place. Ext: Trace upper extremity edema, trace lower extremity/dependent edema.  Imaging: DG Chest Port 1 View  Result Date: 10/15/2020 CLINICAL DATA:  Respiratory failure EXAM: PORTABLE CHEST 1 VIEW COMPARISON:  October 12, 2020 chest radiograph. Chest CT September 24, 2020 FINDINGS: Tracheostomy catheter tip is 5.4 cm above the carina. Enteric tube tip is below the diaphragm. Central catheter tip is in the superior vena cava near the cavoatrial junction. No pneumothorax. There is airspace opacity throughout both mid and lower lung zones with small underlying pleural effusions. Heart is upper normal in size with pulmonary vascularity normal. No adenopathy. There is aortic atherosclerosis. No bone lesions. IMPRESSION: Tube and catheter positions as described without pneumothorax. Multifocal airspace opacity raises concern for underlying pneumonia, potentially of atypical organism etiology. Check of COVID-19 status advised. Small pleural effusions bilaterally. Note that there may well be a degree of underlying ARDS and/or pulmonary edema. Stable cardiac silhouette. Aortic Atherosclerosis (ICD10-I70.0). Electronically Signed   By: Lowella Grip III M.D.   On: 10/15/2020 09:04    Labs: BMET Recent Labs  Lab 10/13/20 0319 10/13/20 1659 10/14/20 0320 10/14/20 1755 10/15/20 0500 10/15/20 1900 10/16/20 0310  NA 134* 131* 132* 132* 132* 133* 129*  K 4.7 4.9 5.2* 5.1 4.9 4.5 5.4*  CL 99 100 99 100 98 98 97*  CO2 _1 GLUCOSE 210* 215* 153* 251* 268* 281* 346*  BUN _2 28* 33* 44* 51*  CREATININE 0.53 0.56 0.50  0.54 0.63 0.64 0.62  CALCIUM 10.1 9.9 9.7 9.6 10.3 9.7 9.8  PHOS 3.4 3.6 3.2 3.7 3.9 4.0 4.2   CBC Recent Labs  Lab 10/13/20 0319 10/14/20 0320 10/15/20 0500 10/16/20 0310  WBC 12.8* 16.5* 15.2* 16.5*  HGB 7.6* 7.7* 7.3* 7.3*  HCT  24.8* 25.9* 24.1* 24.6*  MCV 96.5 97.4 94.9 96.1  PLT 192 210 206 249    Medications:    . B-complex with vitamin C  1 tablet Per Tube Daily  . chlorhexidine gluconate (MEDLINE KIT)  15 mL Mouth Rinse BID  . Chlorhexidine Gluconate Cloth  6 each Topical Daily  . clonazepam  1 mg Per Tube BID  . collagenase   Topical Daily  . darbepoetin (ARANESP) injection - DIALYSIS  60 mcg Subcutaneous Q Tue-1800  . docusate  100 mg Per Tube BID  . feeding supplement (PROSource TF)  90 mL Per Tube TID  . hydrocortisone sod succinate (SOLU-CORTEF) inj  50 mg Intravenous Q6H  . insulin aspart  0-15 Units Subcutaneous Q4H  . insulin aspart  5 Units Subcutaneous Q4H  . insulin glargine  35 Units Subcutaneous Daily  . mouth rinse  15 mL Mouth Rinse QID  . midodrine  20 mg Per Tube TID WC  . nutrition supplement (JUVEN)  1 packet Per Tube BID BM  . oxyCODONE  5 mg Per Tube Q8H  . pantoprazole sodium  40 mg Per Tube BID  . polyethylene glycol  17 g Per Tube Daily  . QUEtiapine  100 mg Per Tube BID  . sodium chloride flush  10-40 mL Intracatheter Q12H   Elmarie Shiley, MD 10/16/2020, 9:15 AM

## 2020-10-16 NOTE — Progress Notes (Signed)
Physical Therapy Wound Treatment Patient Details  Name: Lunell Robart MRN: [id number] Date of Birth: 1951-03-02  Today's Date: 10/16/2020 Time: 1330-1406 Time Calculation (min): 36 min  Subjective  Subjective: Lethargic on vent. Patient and Family Stated Goals: Heal wound; maintain pt comfort per daughter Date of Onset:  (Unknown)  Pain Score:  0/10  Wound Assessment  Pressure Injury 09/25/20 Sacrum Bilateral;Medial Deep Tissue Pressure Injury - Purple or maroon localized area of discolored intact skin or blood-filled blister due to damage of underlying soft tissue from pressure and/or shear. Purple, non-blanchable, b (Active)  Wound Image   10/13/20 1334  Dressing Type ABD;Barrier Film (skin prep);Gauze (Comment) 10/16/20 1552  Dressing Changed;Clean;Dry;Intact 10/16/20 1552  Dressing Change Frequency Daily 10/16/20 1552  State of Healing Eschar 10/16/20 1552  Site / Wound Assessment Black;Brown;Red;Yellow 10/16/20 1552  % Wound base Red or Granulating 20% 10/15/20 1808  % Wound base Yellow/Fibrinous Exudate 0% 10/14/20 1432  % Wound base Black/Eschar 80% 10/15/20 1808  % Wound base Other/Granulation Tissue (Comment) 0% 10/15/20 1808  Peri-wound Assessment Intact 10/16/20 1552  Wound Length (cm) 13 cm 10/13/20 1200  Wound Width (cm) 12.5 cm 10/13/20 1200  Wound Depth (cm) 0.1 cm 10/13/20 1200  Wound Surface Area (cm^2) 162.5 cm^2 10/13/20 1200  Wound Volume (cm^3) 16.25 cm^3 10/13/20 1200  Tunneling (cm) 0 10/13/20 1200  Undermining (cm) 0 10/13/20 1200  Margins Unattached edges (unapproximated) 10/16/20 1552  Drainage Amount Minimal 10/16/20 1552  Drainage Description Serosanguineous 10/16/20 1552  Treatment Debridement (Selective);Hydrotherapy (Pulse lavage);Packing (Saline gauze) 10/16/20 1552  Santyl applied to wound bed prior to applying dressing.   Hydrotherapy Pulsed lavage therapy - wound location: sacrum Pulsed Lavage with Suction (psi): 8 psi (8-12) Pulsed Lavage  with Suction - Normal Saline Used: 1000 mL Pulsed Lavage Tip: Tip with splash shield Selective Debridement Selective Debridement - Location: sacrum Selective Debridement - Tools Used: Forceps;Scalpel Selective Debridement - Tissue Removed: eschar   Wound Assessment and Plan  Wound Therapy - Assess/Plan/Recommendations Wound Therapy - Clinical Statement: Superior portion of wound demonstrates eschar softening, thus allowing for progression of removal via sharp debridement. This patient will benefit from continued hydrotherapy for selective removal of unviable tissue, to decrease bioburden and promote wound bed healing. Wound Therapy - Functional Problem List: Global weakness and immobility Factors Delaying/Impairing Wound Healing: Diabetes Mellitus;Immobility;Multiple medical problems Hydrotherapy Plan: Debridement;Dressing change;Patient/family education;Pulsatile lavage with suction Wound Therapy - Frequency: 6X / week Wound Therapy - Follow Up Recommendations: Skilled nursing facility Wound Plan: See above  Wound Therapy Goals- Improve the function of patient's integumentary system by progressing the wound(s) through the phases of wound healing (inflammation - proliferation - remodeling) by: Decrease Necrotic Tissue to: 20% Decrease Necrotic Tissue - Progress: Progressing toward goal Increase Granulation Tissue to: 80% Increase Granulation Tissue - Progress: Progressing toward goal Goals/treatment plan/discharge plan were made with and agreed upon by patient/family: No, Patient unable to participate in goals/treatment/discharge plan and family unavailable Time For Goal Achievement: 7 days Wound Therapy - Potential for Goals: Good  Goals will be updated until maximal potential achieved or discharge criteria met.  Discharge criteria: when goals achieved, discharge from hospital, MD decision/surgical intervention, no progress towards goals, refusal/missing three consecutive treatments  without notification or medical reason.  GP  [name], PT, DPT Acute Rehabilitation Services Pager [phone] Office [phone]      [name] 10/16/2020, 3:55 PM

## 2020-10-16 NOTE — Progress Notes (Signed)
[facility] for Heparin Indication: atrial fibrillation  Assessment: 69 y.o. female with a history of atrial fibrillation on chronic Eliquis prior to admission which has been held and patient transitioned to IV heparin infusion per pharmacy dosing consult.   Heparin level remains slightly above goal at 0.54.  Goal of Therapy:  Heparin level 0.3-0.5 units/ml Monitor platelets by anticoagulation protocol: Yes   Plan:  Reduce heparin to 1200 units/h Daily heparin level and CBC   Arrie Senate, PharmD, [place], [facility] Clinical Pharmacist [phone] Please check AMION for all [facility] numbers 10/16/2020

## 2020-10-17 ENCOUNTER — Inpatient Hospital Stay (HOSPITAL_COMMUNITY): Payer: Medicare Other

## 2020-10-17 LAB — CBC
HCT: 24.7 % — ABNORMAL LOW (ref 36.0–46.0)
Hemoglobin: 7.3 g/dL — ABNORMAL LOW (ref 12.0–15.0)
MCH: 28.9 pg (ref 26.0–34.0)
MCHC: 29.6 g/dL — ABNORMAL LOW (ref 30.0–36.0)
MCV: 97.6 fL (ref 80.0–100.0)
Platelets: 294 10*3/uL (ref 150–400)
RBC: 2.53 MIL/uL — ABNORMAL LOW (ref 3.87–5.11)
RDW: 15.7 % — ABNORMAL HIGH (ref 11.5–15.5)
WBC: 15 10*3/uL — ABNORMAL HIGH (ref 4.0–10.5)
nRBC: 1.8 % — ABNORMAL HIGH (ref 0.0–0.2)

## 2020-10-17 LAB — RENAL FUNCTION PANEL
Albumin: 2.6 g/dL — ABNORMAL LOW (ref 3.5–5.0)
Albumin: 2.5 g/dL — ABNORMAL LOW (ref 3.5–5.0)
Anion gap: 10 (ref 5–15)
Anion gap: 8 (ref 5–15)
BUN: 49 mg/dL — ABNORMAL HIGH (ref 8–23)
BUN: 54 mg/dL — ABNORMAL HIGH (ref 8–23)
CO2: 25 mmol/L (ref 22–32)
CO2: 25 mmol/L (ref 22–32)
Calcium: 10.2 mg/dL (ref 8.9–10.3)
Calcium: 10.2 mg/dL (ref 8.9–10.3)
Chloride: 98 mmol/L (ref 98–111)
Chloride: 96 mmol/L — ABNORMAL LOW (ref 98–111)
Creatinine, Ser: 0.65 mg/dL (ref 0.44–1.00)
Creatinine, Ser: 0.59 mg/dL (ref 0.44–1.00)
GFR, Estimated: >60 mL/min (ref >60)
GFR, Estimated: >60 mL/min (ref >60)
Glucose, Bld: 267 mg/dL — ABNORMAL HIGH (ref 70–99)
Glucose, Bld: 277 mg/dL — ABNORMAL HIGH (ref 70–99)
Phosphorus: 3.3 mg/dL (ref 2.5–4.6)
Phosphorus: 3.4 mg/dL (ref 2.5–4.6)
Potassium: 4.3 mmol/L (ref 3.5–5.1)
Potassium: 4.8 mmol/L (ref 3.5–5.1)
Sodium: 133 mmol/L — ABNORMAL LOW (ref 135–145)
Sodium: 129 mmol/L — ABNORMAL LOW (ref 135–145)

## 2020-10-17 LAB — GLUCOSE, CAPILLARY
Glucose-Capillary: 238 mg/dL — ABNORMAL HIGH (ref 70–99)
Glucose-Capillary: 210 mg/dL — ABNORMAL HIGH (ref 70–99)
Glucose-Capillary: 190 mg/dL — ABNORMAL HIGH (ref 70–99)
Glucose-Capillary: 221 mg/dL — ABNORMAL HIGH (ref 70–99)
Glucose-Capillary: 270 mg/dL — ABNORMAL HIGH (ref 70–99)

## 2020-10-17 LAB — MAGNESIUM: Magnesium: 2.6 mg/dL — ABNORMAL HIGH (ref 1.7–2.4)

## 2020-10-17 LAB — HEPARIN LEVEL (UNFRACTIONATED): Heparin Unfractionated: 0.5 IU/mL (ref 0.30–0.70)

## 2020-10-17 MED ORDER — GUAIFENESIN 100 MG/5ML PO SOLN
15.0000 mL | Freq: Four times a day (QID) | ORAL | Status: DC
  Administered 2020-10-17 – 2020-10-24 (×28): 300 mg
  Filled 2020-10-17 (×28): qty 15

## 2020-10-17 MED ORDER — HYDROMORPHONE HCL 1 MG/ML IJ SOLN
1.0000 mg | Freq: Every day | INTRAMUSCULAR | Status: DC | PRN
  Administered 2020-10-18 – 2020-11-11 (×12): 1 mg via INTRAVENOUS
  Filled 2020-10-17 (×15): qty 1

## 2020-10-17 MED ORDER — INSULIN GLARGINE 100 UNIT/ML ~~LOC~~ SOLN
40.0000 [IU] | Freq: Every day | SUBCUTANEOUS | Status: DC
  Administered 2020-10-17 – 2020-10-29 (×12): 40 [IU] via SUBCUTANEOUS
  Filled 2020-10-17 (×14): qty 0.4

## 2020-10-17 MED ORDER — HYDROMORPHONE HCL 1 MG/ML IJ SOLN
0.5000 mg | INTRAMUSCULAR | Status: DC | PRN
  Administered 2020-10-17: 2 mg via INTRAVENOUS
  Administered 2020-10-17: 1 mg via INTRAVENOUS
  Administered 2020-10-18 (×2): 2 mg via INTRAVENOUS
  Administered 2020-10-19: 1 mg via INTRAVENOUS
  Administered 2020-10-19 (×2): 2 mg via INTRAVENOUS
  Administered 2020-10-19: 1 mg via INTRAVENOUS
  Administered 2020-10-20 (×3): 2 mg via INTRAVENOUS
  Administered 2020-10-20: 1 mg via INTRAVENOUS
  Administered 2020-10-20 – 2020-10-21 (×6): 2 mg via INTRAVENOUS
  Administered 2020-10-21: 1 mg via INTRAVENOUS
  Administered 2020-10-21 – 2020-10-22 (×5): 2 mg via INTRAVENOUS
  Administered 2020-10-23 (×2): 1 mg via INTRAVENOUS
  Administered 2020-10-23: 2 mg via INTRAVENOUS
  Administered 2020-10-25 – 2020-10-31 (×4): 1 mg via INTRAVENOUS
  Administered 2020-11-01 (×3): 2 mg via INTRAVENOUS
  Administered 2020-11-01: 1 mg via INTRAVENOUS
  Administered 2020-11-03 (×2): 2 mg via INTRAVENOUS
  Filled 2020-10-17 (×3): qty 2
  Filled 2020-10-17 (×2): qty 1
  Filled 2020-10-17: qty 2
  Filled 2020-10-17: qty 1
  Filled 2020-10-17 (×4): qty 2
  Filled 2020-10-17: qty 1
  Filled 2020-10-17 (×2): qty 2
  Filled 2020-10-17 (×2): qty 1
  Filled 2020-10-17 (×3): qty 2
  Filled 2020-10-17: qty 1
  Filled 2020-10-17: qty 2
  Filled 2020-10-17: qty 1
  Filled 2020-10-17 (×4): qty 2
  Filled 2020-10-17 (×2): qty 1
  Filled 2020-10-17 (×4): qty 2
  Filled 2020-10-17: qty 1
  Filled 2020-10-17 (×3): qty 2

## 2020-10-17 MED ORDER — OXYCODONE HCL 5 MG PO TABS
5.0000 mg | ORAL_TABLET | ORAL | Status: DC
  Administered 2020-10-17 – 2020-10-23 (×35): 5 mg
  Filled 2020-10-17 (×35): qty 1

## 2020-10-17 MED ORDER — INSULIN ASPART 100 UNIT/ML ~~LOC~~ SOLN
7.0000 [IU] | SUBCUTANEOUS | Status: DC
  Administered 2020-10-17 – 2020-10-30 (×72): 7 [IU] via SUBCUTANEOUS

## 2020-10-17 NOTE — Progress Notes (Signed)
Patient ID: Candace [name], female   DOB: [birth year], 69 y.o.   MRN: [id number]  [facility] Progress Note   Assessment/ Plan:   1. Acute kidney Injury: Likely from ATN associated with multifactorial shock-septic shock; MRSA pneumonia as well as hemorrhagic shock.  She is anuric and without any evidence of renal recovery (on renal replacement therapy since 09/08/2020); given hypercatabolic rate, we will continue CRRT at the current prescription.  We will treat hyperkalemia with insulin to correct hyperglycemia and Lokelma via NGT.  Although off pressors, does not appear to be stable enough for intermittent hemodialysis at this time. 2.  Septic shock secondary to MRSA pneumonia: Status post completion of intravenous vancomycin and ongoing support with (intermittent pressors and) midodrine 20 mg 3 times a day. 3.  Perforated duodenal ulcer with acute blood loss anemia: Status post exploratory laparotomy and Graham patch placement for definitive management.  Continue Aranesp at this time. 4.  Acute hypoxic/hypercapnic respiratory failure: Secondary to ARDS from COVID-19 infection, remains ventilator dependent. 5.  Severe protein calorie malnutrition: On enteral feeding.  Subjective:   Without acute events overnight, continues to tolerate CRRT without problems.  With copious respiratory secretions.   Objective:   BP (!) 98/58   Pulse 96   Temp (!) 94.5 F (34.7 C) (Axillary) Comment: bear hugger applied  Resp 19   Ht 5' 8"  (1.727 m)   Wt 78.9 kg   SpO2 100%   BMI 26.45 kg/m   Intake/Output Summary (Last 24 hours) at 10/17/2020 0924 Last data filed at 10/17/2020 0900 Gross per 24 hour  Intake 1844.88 ml  Output 2992 ml  Net -1147.12 ml   Weight change: -1.6 kg  Physical Exam: Gen: On ventilator via tracheostomy-awake/alert.  On CRRT. CVS: Pulse irregularly irregular, normal rate, S1 and S2 normal.  Right subclavian hemodialysis catheter. Resp: Coarse breath sounds  bilaterally without distinct rales or rhonchi Abd: Soft, midline dressing is clean and intact.  RLQ colostomy bag in place. Ext: Trace upper extremity edema, trace lower extremity/dependent edema.  Imaging: No results found.  Labs: BMET Recent Labs  Lab 10/14/20 0320 10/14/20 1755 10/15/20 0500 10/15/20 1900 10/16/20 0310 10/16/20 1717 10/17/20 0631  NA 132* 132* 132* 133* 129* 132* 133*  K 5.2* 5.1 4.9 4.5 5.4* 4.0 4.3  CL 99 100 98 98 97* 97* 98  CO2 24 23 24 24 24 24 25   GLUCOSE 153* 251* 268* 281* 346* 308* 267*  BUN 22 28* 33* 44* 51* 51* 49*  CREATININE 0.50 0.54 0.63 0.64 0.62 0.67 0.65  CALCIUM 9.7 9.6 10.3 9.7 9.8 10.4* 10.2  PHOS 3.2 3.7 3.9 4.0 4.2 3.9 3.3   CBC Recent Labs  Lab 10/14/20 0320 10/15/20 0500 10/16/20 0310 10/17/20 0631  WBC 16.5* 15.2* 16.5* 15.0*  HGB 7.7* 7.3* 7.3* 7.3*  HCT 25.9* 24.1* 24.6* 24.7*  MCV 97.4 94.9 96.1 97.6  PLT 210 206 249 294    Medications:    . B-complex with vitamin C  1 tablet Per Tube Daily  . chlorhexidine gluconate (MEDLINE KIT)  15 mL Mouth Rinse BID  . Chlorhexidine Gluconate Cloth  6 each Topical Daily  . clonazepam  1 mg Per Tube BID  . collagenase   Topical Daily  . darbepoetin (ARANESP) injection - DIALYSIS  60 mcg Subcutaneous Q Tue-1800  . docusate  100 mg Per Tube BID  . feeding supplement (PROSource TF)  90 mL Per Tube TID  . hydrocortisone sod succinate (SOLU-CORTEF) inj  50 mg Intravenous Q12H  . insulin aspart  0-15 Units Subcutaneous Q4H  . insulin aspart  5 Units Subcutaneous Q4H  . insulin glargine  35 Units Subcutaneous Daily  . mouth rinse  15 mL Mouth Rinse QID  . midodrine  20 mg Per Tube TID WC  . nutrition supplement (JUVEN)  1 packet Per Tube BID BM  . oxyCODONE  10 mg Per Tube Q8H  . pantoprazole sodium  40 mg Per Tube BID  . polyethylene glycol  17 g Per Tube Daily  . QUEtiapine  100 mg Per Tube BID  . sodium chloride flush  10-40 mL Intracatheter Q12H   Elmarie Shiley,  MD 10/17/2020, 9:24 AM

## 2020-10-17 NOTE — Progress Notes (Signed)
NAMEProvidence [name], MRN:  [id number], DOB:  1950-11-10, LOS: 30 ADMISSION DATE:  08/31/2020, CONSULTATION DATE:  09/07/2020 REFERRING MD:  Dr Candiss Norse, CHIEF COMPLAINT:  Acute resp failure  Brief History   69 year old female who was previously diagnosed with Covid 08/23/2020.  Admitted 11/2 with AF-RVR, found to have a perforated duodenal underwent exploratory laparotomy with Phillip Heal patch placement.  Past Medical History  Covid pneumonia Atrial fibrillation CKD stage III Diabetes mellitus Hypertension Colon cancer Hyperlipidemia  Significant Hospital Events   11/2 Admitted  11/3 OR -> perforated duodenal ulcer 11/10 progressive hemorrhagic shock, intubated, transfused, pressors, proned; started on CRRT in PM 11/16 Extubated. Re-intubated overnight due to respiratory distress and hypoxia with decreased mentation 11/18  bronch'd/ cultures sent 11/19 hgb down getting blood 09/22/2020 spiked fever resume empirical antimicrobial therapy 11/26: hemorrhagic shock, hgb 5.6, increased pressors, CT A/P  11/30 per palliative "[name] expresses understanding that patient is unfortunately very fragile despite ongoing intensive medical treatment and full mechanical support. She indicates that the family wants to continue with all current interventions despite potential outcomes". 12/8: CRRT discontinued due to clotting 12/9 family requested transfer to tertiary care [facility]). They did not feel that they could offer further treatment and denied transfer  12/10-12/12: CRRT restarted. Continues to have episodes of tachycardia, tachypnea that seem to improve with pain management 12/11 back in shock. Pressor requirements  Up. CXR worse. ABX resumed 12/12 still requiring inc pressors. Had hypoglycemic event. Basal insulin dosing adjusted 12/13 pressor requirements better. Now hyperglycemic. Re-adjusted Glycemic control  12/14 changed dilaudid to 1/2 dosing from day further. Dc vasopressin.  Goals of care  reconfirmed with daughter.  Patient continues to desire aggressive care.  Not open to discussing any other option, patient family continues to be hopeful that she will be discharged to home with full recovery in spite of multiple attempts by staff to prepare them that this is unlikely scenario 12/15 Dilaudid discontinued sending cortisol for ongoing pressor dependence 12/16 serum cortisol less than 20, adding stress dose steroids to see if this helps with get off pressors.  Making small adjustment and as needed Dilaudid, attempting to not go back on Dilaudid infusion 12/17: Developed worsening tachycardia during the evening hours received initially IV albumin, followed by resuming IV Dilaudid with question of suboptimal pain control.  Currently looks better back on Dilaudid drip.  We have been able to wean pressors after adding stress dose steroids; near arrest - bradycardia, better with atropine   Consults:  Cardiology [facility] Nephrology   Procedures:  11/3 Exploratory laparotomy, Phillip Heal patch, lysis of adhesion for duodenal ulceration postop day 6  R PICC 11/5 >> A line 11/9 >> out ETT 11/9 > 11/16, 11/16 >> 09/21/2020 09/21/2020 tracheostomy>> Lt [place] CVL 11/9 >> R IJ trialysis >> out HD catheter 12/1 >>   Significant Diagnostic Tests:  11/3 CT abd/ pelvis > 1. Positive for bowel perforation: Pneumoperitoneum and intermediate density free fluid in the abdomen. Prior total colectomy. The specific site of perforation is unclear-oral contrast present to the proximal jejunum has not obviously leaked. Note that there may be small bowel loops adherent to the ventral abdominal wall along the greater curve of the stomach. 2. Extensive bilateral lower lung pneumonia. No pleural effusion. 3. Other abdominal and pelvic viscera are stable since 2015, including bilateral adrenal adenomas. Chronic renal parapelvic cysts. 4. Aortic Atherosclerosis 11/3 TTE > EF 70-75%, RV not well visualized, mildly  reduced RV systolic function 35/57 CT chest/ abd/ pelvis> 1.  Interval progression of diffuse bilateral hazy ground-glass airspace opacities with more focal areas of consolidation at the lung bases 2. Trace bilateral pleural effusions. 3. Postsurgical changes the abdomen as detailed above. No evidence for a postoperative abscess, however evaluation is limited by lack of IV contrast. 4. There is a 1.9 cm cystic appearing lesion located in the pancreatic body. This was not present on the patient's CT from 2015.  Follow-up with an outpatient contrast enhanced MRI is recommended. 5. The endometrial stripe appears diffusely thickened. Follow-up with pelvic ultrasound is recommended. Aortic Atherosclerosis 11/14 LE doppler studies > + DVT of right posterior tibial and peroneal vein, +dVT of left posterior tibial vein  11/26 CT ABD PEL > liver unremarkable, distended gallbladder with layering tiny gallstones versus sludge, no duct dilatation, mild hyperdensity right upper pole renal collecting system new.  No evidence of retroperitoneal bleeding.  Multifocal lower lobe predominant pulmonary infiltrates/pneumonia.  Small left pleural effusion.  Micro Data:  11/10 MRSA PCR > neg 11/10 BC x 2 > neg 09/22/2020 blood cultures x2>> S epi.  09/22/2020 sputum culture>> MRSA Blood 12/1 >> negative. S.epi 1 out of 2, likely contaminant BCx 2 12/12 >> neg  Antimicrobials:  azithro 11/2 >11/3 Ceftriaxone 11/2  Fluconazole 11/3 Zosyn 11/3 >> 11/7 Vanc 11/10 off Cefepime 11/10 > 11/16 09/22/2020 vancomycin for MRSA PNA >> 12/2  Vanc 12/11> 12/17 Zosyn 12/11> 12/17  Subjective:  Temp down 94.5 overnight Able to wean PEEP down yesterday Mucous plugging event overnight/ brief bradycardia Weaning today PSV 12/5 since 0750 Remains on CRRT, even UF  Objective   Blood pressure (!) 98/58, pulse 96, temperature (!) 94.5 F (34.7 C), temperature source Axillary, resp. rate 19, height 5\' 8"  (1.727 m), weight  78.9 kg, SpO2 100 %.    Vent Mode: PRVC FiO2 (%):  [40 %] 40 % Set Rate:  [28 bmp] 28 bmp Vt Set:  [450 mL] 450 mL PEEP:  [5 cmH20] 5 cmH20 Plateau Pressure:  [23 cmH20-30 cmH20] 24 cmH20   Intake/Output Summary (Last 24 hours) at 10/17/2020 0940 Last data filed at 10/17/2020 0900 Gross per 24 hour  Intake 1844.88 ml  Output 3080 ml  Net -1235.12 ml   Filed Weights   10/15/20 0500 10/16/20 0400 10/17/20 0500  Weight: 81.8 kg 80.5 kg 78.9 kg   Physical Exam:  General:  Chronically ill older female in NAD HEENT: MM pink/moist, pupils 3/reactive, anicteric, left cortrack, midline Shiley trach Neuro: awakens to verbal, f/c, able to lightly squeeze hands and wiggle toes, no movement against gravity  CV: rr, NSR, no murmur PULM:  Tolerating PSV 12/5 great, some rhonchi R> L, some moderate secretions with morning with RT but minimal on my exam  GI: soft, ND, NT, ostomy with stool output, dry L abd dressing  Extremities: warm/dry, generalized +1-2 edema  Skin: no rashes   -1.2/ net +2.5 WT 81.8-> 80.5-> 78.9 Labs reviewed- remains hyperglycemic consistently in the 200's, Hgb stable at 7.3, WBC trend stable     Previously hemorrhagic shock requiring PRBCs, Septic shock, MRSA pneumonia s/p 8 days abx  Assessment & Plan:   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, status post tracheostomy Complicated by healthcare associated pneumonia due to MRSA Continue full MV support Continue to slowly wean PEEP/ FiO2 remains 40% Ongoing daily SBT trials- first day of tolerating SBT in awhile Continue minimizing sedation as tolerated, RASS goal 0/-1, dilaudid gtt/ enteral  Intermittent CXR  VAP/ PPI  Continue  albuterol, adding guaifenesin for pulmonary hygiene  Recurrent septic shock 12/11-->working dx recurrent HCAP c/b adrenal insuff Plan  Completed 7 days of vanc and zosyn 12/17 Leukocytosis trend stable.  Was hypothermic overnight but could  be related to CRRT.  Some increased secretions, if ongoing, consider sending for culture but has made progress and weaning therefore will hold off and monitor clinically Remains off vasopressors Continue wean of stress dose steroids (started 12/18) Continue midodrine 20 mg TID  Acute metabolic encephalopathy in the setting of renal failure, shock, sepsis -back on dilaudid gtt 12/17 Plan Continue current Seroquel, clonazepam, oxy (increased to 10mg  q 8 hr to wean dilaudid gtt 12/19, but still uncomfortable appearing per bedside.  Ongoing attempts to minimize gtt; looks comfortable today and nods no to pain Continue neuro checks   AKI, oliguric/anuric Hyperkalemia  --Nephrology consulted. Deemed NOT long term HD candidate.  --CRRT started on 11/10; held 12/8; resumed on 12/10 Plan CRRT per nephrology   Duodenal ulcer perforation status post ex lap and Graham's patch --Appreciate CTS assistance and management Plan Continue wound care  Paroxysmal A. fib, currently in sinus rhythm --Sinus tachycardia 12/17 (? Pain) Plan Continue IV heparin Trending H/H- stable, no evidence of bleeding   Diabetes type 2 w/ marked Hyperglycemia.  Much worse on steroids Plan Blood glucose goal 140-180  Has been sensitive to insulin changes previously, but glucose consistently remains > 200 Continuing sliding scale insulin moderate Increase lantus today 35-> 40 units and increase TF 5-> 7 units q 4 Ongoing weaning of stress dose steroids   Indeterminate bilateral DVT Plan IV heparin  History of stage IV colon cancer Status post pulmonary [facility]atectom[facility] at [facility] + Chemo 1.9 cm cystic appearing lesion located in the pancreatic body Plan Oncology follow-up if survives  Best practice:  Diet: Tube feeds, these are being tolerated Pain/Anxiety/Delirium protocol (if indicated) dilaudid gtt/ enteral sedation-  clonazepam, oxy, seroquel VAP protocol (if indicated): yes DVT prophylaxis: IV  heparin GI prophylaxis: Protonix Glucose control: SSI, TF coverage, lantus Mobility: Bedrest Code Status: Full Goals of care discussion last completed on 12/14 with daughter, this was a multidisciplinary discussion including nurse practitioner, bedside nurse, and the patient's daughter.  There have been no changes in goals of care, family continues to desire any and all therapies, remains full code, family remains hopeful that she will survive and be discharged to home  Next multidisciplinary goals of care discussion due 12/21  Daughter, Briant Cedar updated by phone by Dr. Ander Slade 12/19.    CCT: 35 mins  Kennieth Rad, ACNP [facility] 10/17/2020, 9:40 AM

## 2020-10-17 NOTE — Progress Notes (Addendum)
[place] for Heparin Indication: atrial fibrillation  Assessment: 69 y.o. female with a history of atrial fibrillation on chronic Eliquis prior to admission which has been held and patient transitioned to IV heparin infusion per pharmacy dosing consult.   Heparin level at high end of goal at 0.5 on 1200 units/hr.  Hemoglobin stable. Platelets are within normal limits.  Remains on CRRT-net event to net negative 48mL/hr.  No overt bleeding noted. Note that patient has been accumulating so will decrease slightly to keep in goal range and recheck level in AM.    Goal of Therapy:  Heparin level 0.3-0.5 units/ml Monitor platelets by anticoagulation protocol: Yes   Plan:  Reduce heparin to 1150 units/hr Daily heparin level and CBC  Sloan Leiter, PharmD, BCPS, BCCCP Clinical Pharmacist Please refer to [place] for [place] numbers 10/17/2020

## 2020-10-18 DIAGNOSIS — J9620 Acute and chronic respiratory failure, unspecified whether with hypoxia or hypercapnia: Secondary | ICD-10-CM

## 2020-10-18 LAB — BLOOD GAS, ARTERIAL
Acid-Base Excess: 0.5 mmol/L (ref 0.0–2.0)
Bicarbonate: 24.9 mmol/L (ref 20.0–28.0)
Drawn by: 60057
FIO2: 40
O2 Saturation: 98.4 %
Patient temperature: 37.2
pCO2 arterial: 42.4 mmHg (ref 32.0–48.0)
pH, Arterial: 7.387 (ref 7.350–7.450)
pO2, Arterial: 105 mmHg (ref 83.0–108.0)

## 2020-10-18 LAB — RENAL FUNCTION PANEL
Albumin: 2.5 g/dL — ABNORMAL LOW (ref 3.5–5.0)
Albumin: 2.4 g/dL — ABNORMAL LOW (ref 3.5–5.0)
Anion gap: 12 (ref 5–15)
Anion gap: 9 (ref 5–15)
BUN: 50 mg/dL — ABNORMAL HIGH (ref 8–23)
BUN: 53 mg/dL — ABNORMAL HIGH (ref 8–23)
CO2: 23 mmol/L (ref 22–32)
CO2: 25 mmol/L (ref 22–32)
Calcium: 10.2 mg/dL (ref 8.9–10.3)
Calcium: 10.3 mg/dL (ref 8.9–10.3)
Chloride: 98 mmol/L (ref 98–111)
Chloride: 98 mmol/L (ref 98–111)
Creatinine, Ser: 0.57 mg/dL (ref 0.44–1.00)
Creatinine, Ser: 0.54 mg/dL (ref 0.44–1.00)
GFR, Estimated: >60 mL/min (ref >60)
GFR, Estimated: >60 mL/min (ref >60)
Glucose, Bld: 144 mg/dL — ABNORMAL HIGH (ref 70–99)
Glucose, Bld: 264 mg/dL — ABNORMAL HIGH (ref 70–99)
Phosphorus: 2.9 mg/dL (ref 2.5–4.6)
Phosphorus: 2.7 mg/dL (ref 2.5–4.6)
Potassium: 5.3 mmol/L — ABNORMAL HIGH (ref 3.5–5.1)
Potassium: 4.7 mmol/L (ref 3.5–5.1)
Sodium: 133 mmol/L — ABNORMAL LOW (ref 135–145)
Sodium: 132 mmol/L — ABNORMAL LOW (ref 135–145)

## 2020-10-18 LAB — CBC
HCT: 25.2 % — ABNORMAL LOW (ref 36.0–46.0)
Hemoglobin: 7.4 g/dL — ABNORMAL LOW (ref 12.0–15.0)
MCH: 28.9 pg (ref 26.0–34.0)
MCHC: 29.4 g/dL — ABNORMAL LOW (ref 30.0–36.0)
MCV: 98.4 fL (ref 80.0–100.0)
Platelets: 260 10*3/uL (ref 150–400)
RBC: 2.56 MIL/uL — ABNORMAL LOW (ref 3.87–5.11)
RDW: 15.8 % — ABNORMAL HIGH (ref 11.5–15.5)
WBC: 17.9 10*3/uL — ABNORMAL HIGH (ref 4.0–10.5)
nRBC: 3.9 % — ABNORMAL HIGH (ref 0.0–0.2)

## 2020-10-18 LAB — GLUCOSE, CAPILLARY
Glucose-Capillary: 253 mg/dL — ABNORMAL HIGH (ref 70–99)
Glucose-Capillary: 113 mg/dL — ABNORMAL HIGH (ref 70–99)
Glucose-Capillary: 105 mg/dL — ABNORMAL HIGH (ref 70–99)
Glucose-Capillary: 214 mg/dL — ABNORMAL HIGH (ref 70–99)
Glucose-Capillary: 261 mg/dL — ABNORMAL HIGH (ref 70–99)
Glucose-Capillary: 181 mg/dL — ABNORMAL HIGH (ref 70–99)
Glucose-Capillary: 233 mg/dL — ABNORMAL HIGH (ref 70–99)

## 2020-10-18 LAB — MAGNESIUM: Magnesium: 2.4 mg/dL (ref 1.7–2.4)

## 2020-10-18 LAB — MISC LABCORP TEST (SEND OUT): Labcorp test code: 716910

## 2020-10-18 LAB — HEPARIN LEVEL (UNFRACTIONATED): Heparin Unfractionated: 0.28 IU/mL — ABNORMAL LOW (ref 0.30–0.70)

## 2020-10-18 MED ORDER — CEFAZOLIN SODIUM-DEXTROSE 2-4 GM/100ML-% IV SOLN
2.0000 g | INTRAVENOUS | Status: AC
  Administered 2020-10-19: 09:00:00 2 g via INTRAVENOUS
  Filled 2020-10-18 (×2): qty 100

## 2020-10-18 NOTE — Progress Notes (Signed)
[place] for Heparin Indication: atrial fibrillation  Assessment: 69 y.o. female with a history of atrial fibrillation on chronic Eliquis prior to admission which has been held and patient transitioned to IV heparin infusion per pharmacy dosing consult.   Heparin level slightly low on 1150 units/hr.  Hemoglobin low but stable. Platelets are within normal limits.  Remains on CRRT No bleeding or issues with infusion per discussion with RN  Goal of Therapy:  Heparin level 0.3-0.5 units/ml Monitor platelets by anticoagulation protocol: Yes   Plan:  Increase heparin slightly to 1200 units/hr Monitor daily heparin level and CBC, s/sx bleeding   Arturo Morton, PharmD, BCPS Please check [facility] for all [place] contact numbers Clinical Pharmacist 10/18/2020 8:45 AM

## 2020-10-18 NOTE — Consult Note (Signed)
[place] [facility] Nurse wound follow up Patient receiving care in [facility] [id number]. Patient seen with PT. Wound type: unstageable Please see PT notes for details of sacral wound.  Continue current POC. Val Riles, RN, MSN, CWOCN, CNS-BC, pager [phone]

## 2020-10-18 NOTE — Progress Notes (Signed)
Attempted video chat for daughter with no response after 8 minutes.  Hillard Danker, RN

## 2020-10-18 NOTE — H&P (Signed)
Chief Complaint: Patient was seen in consultation today for conversion to tunneled dialysis catheter vs new catheter placement at the request of Dr Carolynne Edouard   Supervising Physician: Daryll Brod  Patient Status: [facility] - In-pt  History of Present Illness: Candace [name] is a 69 y.o. female   Diagnosed with Covid 08/23/2020.  Admitted 11/2 with AF-RVR, found to have a perforated duodenal underwent exploratory laparotomy with Phillip Heal patch placement.  AKI--- Likely from ATN associated with multifactorial shock-septic shock; MRSA pneumonia as well as hemorrhagic shock.  She is anuric and without any evidence of renal recovery  CRRT--- temp cath was placed in ICU 12/1 by CCM: Rt IJ Now need for conversion to more stable catheter No evidence of renal recovery Conversion of temp to tunneled vs new placement in IR  Wbc high secondary steroid Afeb  Past Medical History:  Diagnosis Date  . Colon cancer ([place])   . Diabetes mellitus ([place])   . Hyperlipemia   . Hypertension     Past Surgical History:  Procedure Laterality Date  . LAPAROTOMY N/A 09/01/2020   Procedure: EXPLORATORY LAPAROTOMY; Repair of Perforated Duodenal Ulcer;  Surgeon: Jesusita Oka, MD;  Location: [place];  Service: General;  Laterality: N/A;  . LYSIS OF ADHESION N/A 09/01/2020   Procedure: LYSIS OF ADHESION;  Surgeon: Jesusita Oka, MD;  Location: [facility];  Service: General;  Laterality: N/A;    Allergies: Patient has no known allergies.  Medications: Prior to Admission medications   Medication Sig Start Date End Date Taking? Authorizing Provider  acetaminophen (TYLENOL) 500 MG tablet Take 1,000 mg by mouth every 6 (six) hours as needed for mild pain.   Yes [provider]  albuterol (VENTOLIN HFA) 108 (90 Base) MCG/ACT inhaler Inhale 2 puffs into the lungs every 6 (six) hours as needed for wheezing or shortness of breath. 08/29/20  Yes Thurnell Lose, MD  amLODipine (NORVASC) 10 MG tablet Take 1  tablet (10 mg total) by mouth daily. 08/29/20 08/29/21 Yes Thurnell Lose, MD  apixaban (ELIQUIS) 2.5 MG TABS tablet Take 1 tablet (2.5 mg total) by mouth 2 (two) times daily. 08/29/20  Yes Thurnell Lose, MD  aspirin 81 MG EC tablet Take 81 mg by mouth daily. 06/20/18  Yes [provider]  levofloxacin (LEVAQUIN) 250 MG tablet Take 1 tablet (250 mg total) by mouth daily. 08/29/20  Yes Thurnell Lose, MD  metFORMIN (GLUCOPHAGE-XR) 500 MG 24 hr tablet Take 500 mg by mouth in the morning and at bedtime.  07/16/20  Yes [provider]  rosuvastatin (CRESTOR) 40 MG tablet Take 40 mg by mouth daily. 06/22/20  Yes [provider]  zinc gluconate 50 MG tablet Take 50 mg by mouth daily.   Yes [provider]     Family History  Problem Relation Age of Onset  . Clotting disorder Mother        "died of a blood clot"  . Alzheimer's disease Father     Social History   Socioeconomic History  . Marital status: Married    Spouse name: Not on file  . Number of children: Not on file  . Years of education: Not on file  . Highest education level: Not on file  Occupational History  . Not on file  Tobacco Use  . Smoking status: Never Smoker  . Smokeless tobacco: Never Used  Vaping Use  . Vaping Use: Never used  Substance and Sexual Activity  . Alcohol use:  Never  . Drug use: Never  . Sexual activity: Not on file  Other Topics Concern  . Not on file  Social History Narrative  . Not on file   Social Determinants of Health   Financial Resource Strain: Not on file  Food Insecurity: Not on file  Transportation Needs: Not on file  Physical Activity: Not on file  Stress: Not on file  Social Connections: Not on file    Review of Systems: A 12 point ROS discussed and pertinent positives are indicated in the HPI above.  All other systems are negative.   Vital Signs: BP 107/72   Pulse (!) 117   Temp 98.1 F (36.7 C) (Oral)   Resp (!) 38   Ht 5\' 8"   (1.727 m)   Wt 173 lb 15.1 oz (78.9 kg)   SpO2 100%   BMI 26.45 kg/m   Physical Exam Cardiovascular:     Rate and Rhythm: Normal rate.     Heart sounds: Normal heart sounds.  Pulmonary:     Comments: On vent Skin:    General: Skin is warm.  Psychiatric:     Comments: Calling Dtr for consent     Imaging: DG Chest 1 View  Result Date: 10/04/2020 CLINICAL DATA:  Acute respiratory distress. EXAM: CHEST  1 VIEW COMPARISON:  09/29/2020 FINDINGS: Tracheostomy tube overlies the airway. A feeding tube courses into the abdomen with tip not imaged. A right subclavian catheter and right PICC remain in place. The cardiomediastinal silhouette is unchanged. Consolidation in the mid to lower lungs bilaterally is unchanged, and there are persistent small bilateral pleural effusions. No pneumothorax is identified. IMPRESSION: Unchanged bilateral mid and lower lung airspace consolidation and small pleural effusions. Electronically Signed   By: Logan Bores M.D.   On: 10/04/2020 16:03   DG Chest Port 1 View  Result Date: 10/17/2020 CLINICAL DATA:  Respiratory failure. EXAM: PORTABLE CHEST 1 VIEW COMPARISON:  Chest radiograph 10/15/2020. FINDINGS: Tracheostomy tube mid trachea. Enteric tube courses inferior to the diaphragm. Central venous catheter tip projects over the superior vena cava. Monitoring leads overlie the patient. Stable cardiac and mediastinal contours. Similar-appearing bilateral mid lower lung heterogeneous opacities. Small bilateral pleural effusions. No pneumothorax. IMPRESSION: Similar-appearing bilateral mid and lower lung airspace opacities. Small bilateral pleural effusions. Electronically Signed   By: Lovey Newcomer M.D.   On: 10/17/2020 10:57   DG Chest Port 1 View  Result Date: 10/15/2020 CLINICAL DATA:  Respiratory failure EXAM: PORTABLE CHEST 1 VIEW COMPARISON:  October 12, 2020 chest radiograph. Chest CT September 24, 2020 FINDINGS: Tracheostomy catheter tip is 5.4 cm above the  carina. Enteric tube tip is below the diaphragm. Central catheter tip is in the superior vena cava near the cavoatrial junction. No pneumothorax. There is airspace opacity throughout both mid and lower lung zones with small underlying pleural effusions. Heart is upper normal in size with pulmonary vascularity normal. No adenopathy. There is aortic atherosclerosis. No bone lesions. IMPRESSION: Tube and catheter positions as described without pneumothorax. Multifocal airspace opacity raises concern for underlying pneumonia, potentially of atypical organism etiology. Check of COVID-19 status advised. Small pleural effusions bilaterally. Note that there may well be a degree of underlying ARDS and/or pulmonary edema. Stable cardiac silhouette. Aortic Atherosclerosis (ICD10-I70.0). Electronically Signed   By: Lowella Grip III M.D.   On: 10/15/2020 09:04   DG Chest Port 1 View  Result Date: 10/12/2020 CLINICAL DATA:  ARDS. EXAM: PORTABLE CHEST 1 VIEW COMPARISON:  10/08/2020. FINDINGS: Tracheostomy tube,  feeding tube, right PICC line, right subclavian line in stable position. Heart size normal. Bilateral pulmonary infiltrates/edema again noted without interim change. Small bilateral pleural effusions again noted without interim change. No pneumothorax. IMPRESSION: 1. Lines and tubes in stable position. 2. Bilateral pulmonary infiltrates/edema and small bilateral pleural effusions again noted without interim change. Electronically Signed   By: Marcello Moores  Register   On: 10/12/2020 05:25   DG CHEST PORT 1 VIEW  Result Date: 10/08/2020 CLINICAL DATA:  Hypoxemia. EXAM: PORTABLE CHEST 1 VIEW COMPARISON:  October 04, 2020. FINDINGS: Stable cardiomediastinal silhouette. Tracheostomy and feeding tubes are unchanged in position. Right-sided PICC line is unchanged. No pneumothorax is noted. Stable bilateral lung opacities are noted, right greater than left, consistent with multifocal pneumonia and probable small bilateral  pleural effusions. Bony thorax is unremarkable. IMPRESSION: Stable bilateral lung opacities are noted, right greater than left, consistent with multifocal pneumonia and probable small bilateral pleural effusions. Electronically Signed   By: Marijo Conception M.D.   On: 10/08/2020 18:52   DG CHEST PORT 1 VIEW  Result Date: 09/29/2020 CLINICAL DATA:  Central line placement EXAM: PORTABLE CHEST 1 VIEW COMPARISON:  Chest radiograph from one day prior. FINDINGS: Tracheostomy tube tip overlies the tracheal air column just below the thoracic inlet. Right PICC terminates over the middle third of the SVC. New right subclavian central venous catheter terminates over the lower third of the SVC. Enteric tube enters stomach with the tip not seen on this image. Stable cardiomediastinal silhouette with normal heart size. No pneumothorax. Probable stable small bilateral pleural effusions. Extensive patchy consolidation in the mid to lower lungs bilaterally, not substantially changed. IMPRESSION: New right subclavian central venous catheter terminates over the lower third of the SVC. No pneumothorax. Otherwise stable well-positioned support structures. Stable small bilateral pleural effusions and extensive patchy consolidation in the mid to lower lungs bilaterally. Electronically Signed   By: Ilona Sorrel M.D.   On: 09/29/2020 17:35   DG Chest Port 1 View  Result Date: 09/28/2020 CLINICAL DATA:  68 year old female, acute on chronic respiratory failure with hypoxia. EXAM: PORTABLE CHEST 1 VIEW COMPARISON:  09/28/2020 FINDINGS: Tracheostomy cannula remains in place in unchanged position. Enteric feeding tube courses off the inferior aspect of this image. Left subclavian hemodialysis catheter remains in place with the tip in the right atrium. Right upper extremity PICC line remains in place with the tip in the superior vena cava. Mild prominence of the main pulmonary artery, unchanged. Overall unchanged appearance of previously  visualized bibasilar consolidative opacities with slightly increased lung volumes on this study. No new consolidative opacities. Likely trace bilateral pleural effusions. No pneumothorax. Unremarkable skeletal structures. IMPRESSION: Similar appearing bibasilar consolidative opacities. Slightly improved lung volumes. Electronically Signed   By: Ruthann Cancer MD   On: 09/28/2020 16:19   DG Chest Port 1 View  Result Date: 09/28/2020 CLINICAL DATA:  Acute on chronic respiratory failure EXAM: PORTABLE CHEST 1 VIEW COMPARISON:  09/25/2020 FINDINGS: Tracheostomy and nasoenteric feeding tube extending beyond the margin of the examination into the upper abdomen, left subclavian large-bore central venous catheter with its tip within the deep right atrium, and right upper extremity PICC line with its tip within the superior vena cava are unchanged. Lung volumes are small and pulmonary insufflation has slightly decreased in the interval. Bibasilar consolidation is again identified. Patchy infiltrate within the right mid lung zone is unchanged. Possible tiny left pleural effusion. No pneumothorax. Cardiac size within normal limits. IMPRESSION: Slight interval decrease in pulmonary  insufflation. Low lung volumes. Stable bibasilar pulmonary consolidation. Stable support lines and tubes. Electronically Signed   By: Fidela Salisbury MD   On: 09/28/2020 05:06   DG CHEST PORT 1 VIEW  Result Date: 09/25/2020 CLINICAL DATA:  Acute respiratory failure due to COVID, MRSA. EXAM: PORTABLE CHEST 1 VIEW COMPARISON:  Chest x-ray 09/23/2020, CT chest 09/24/2020 FINDINGS: Tracheostomy tube terminating approximately 4.5 cm above the carina. Enteric tube coursing below diaphragm with tip collimated off view. Left chest wall dialysis catheter with tip overlying the right atrium. Right PICC with tip overlying the expected region of the distal superior vena cava. The remainder of the lines and tubes likely external to the patient. The heart  size and mediastinal contours are within normal limits. Grossly unchanged (compared to CT 09/24/2020) bilateral patchy airspace opacities that are most prominent in bilateral lower lung zones and right mid lung zone. No pulmonary edema. Cannot exclude trace pleural effusions bilaterally. No pneumothorax. No acute osseous abnormality. IMPRESSION: 1. Grossly unchanged multifocal pneumonia. 2. Lines and tubes in similar position. Electronically Signed   By: Iven Finn M.D.   On: 09/25/2020 18:57   DG Chest Port 1 View  Result Date: 09/23/2020 CLINICAL DATA:  Abnormal respirations EXAM: PORTABLE CHEST 1 VIEW COMPARISON:  09/21/2020 FINDINGS: Tracheostomy tube, enteric tube, right PICC line, and left central venous catheter are unchanged in position. Cardiac enlargement. No vascular congestion. Airspace disease in the mid and lower lungs, similar to prior study. No pleural effusions. No pneumothorax. IMPRESSION: No significant interval change. Bilateral airspace disease in the mid and lower lungs. Electronically Signed   By: Lucienne Capers M.D.   On: 09/23/2020 06:36   DG Chest Port 1 View  Result Date: 09/21/2020 CLINICAL DATA:  Status post tracheostomy. EXAM: PORTABLE CHEST 1 VIEW COMPARISON:  09/15/2020. FINDINGS: Patient is rotated to the left. Interim extubation and placement of a tracheostomy tube. Tracheostomy tube in good anatomic position. Interim removal of right IJ line. Feeding tube, right PICC line, and large caliber left subclavian line in stable position. Heart size stable. Surgical clips left chest. Surgical sutures right upper lung again noted. Dense bilateral lower lung infiltrates are again noted without interim change. Small left pleural effusion. No pneumothorax. IMPRESSION: 1. Interim extubation and placement of a tracheostomy tube. Tracheostomy tube in good anatomic position. Interim removal of right IJ line. Feeding tube, right PICC line, and large caliber left subclavian line in  stable position. 2. Dense bilateral lower lung infiltrates again noted without interim change. Small left pleural effusion. Electronically Signed   By: Marcello Moores  Register   On: 09/21/2020 11:47   CT CHEST ABDOMEN PELVIS WO CONTRAST  Result Date: 09/24/2020 CLINICAL DATA:  Hypotension, anemia, evaluate for retroperitoneal bleed EXAM: CT CHEST, ABDOMEN AND PELVIS WITHOUT CONTRAST TECHNIQUE: Multidetector CT imaging of the chest, abdomen and pelvis was performed following the standard protocol without IV contrast. COMPARISON:  09/08/2020 FINDINGS: CT CHEST FINDINGS Cardiovascular: Heart is normal in size.  No pericardial effusion. No evidence of thoracic aortic aneurysm. Mild atherosclerotic calcifications of the aortic arch. Mild coronary atherosclerosis of the LAD. Left IJ venous catheter terminates at the cavoatrial junction. Right arm PICC terminates in the upper SVC. Mediastinum/Nodes: Small mediastinal lymph nodes, including a 13 mm short axis right paratracheal node (series 3/image 23), likely reactive. Stable heterogeneous nodularity of the left thyroid (series 3/image 8), previously characterized on thyroid ultrasound. This has been evaluated on previous imaging. (ref: J Am Coll Radiol. 2015 Feb;12(2): 143-50). Lungs/Pleura: Endotracheal  tube terminates 3.5 cm above the carina. Status post right lower lobe wedge resection. Multifocal patchy opacities in this patient with known multifocal pneumonia, lower lobe predominant. This is unchanged when compared to recent priors. No suspicious pulmonary nodules. Small left pleural effusion.  No pneumothorax. Musculoskeletal: Mild degenerative changes of the lower thoracic spine. CT ABDOMEN PELVIS FINDINGS Hepatobiliary: Unenhanced liver is grossly unremarkable. Distended gallbladder with suspected layering tiny gallstones versus gallbladder sludge (series 3/image 70). No intrahepatic or extrahepatic ductal dilatation. Pancreas: Within normal limits. Spleen: Within  normal limits. Adrenals/Urinary Tract: Adrenal glands are within normal limits. Mild hyperdensity in the right upper pole renal collecting system (series 3/image 67), new from the prior and possibly reflecting hemorrhage/clot. Left kidney is within normal limits. No renal calculi or hydronephrosis. Bladder is underdistended but unremarkable. Stomach/Bowel: Enteric tube terminates in the proximal duodenum. Stomach is otherwise grossly unremarkable. Status post colectomy with right mid abdominal ileostomy. Vascular/Lymphatic: No evidence of abdominal aortic aneurysm. Atherosclerotic calcifications of the abdominal aorta and branch vessels. No suspicious abdominopelvic lymphadenopathy. Reproductive: Retroverted uterus. Suspected endometrial thickening is not well visualized on the current unenhanced CT. No adnexal masses. Other: Trace pelvic fluid. No evidence of retroperitoneal hemorrhage. Musculoskeletal: Mild degenerative changes of the lumbar spine. IMPRESSION: No evidence of retroperitoneal hemorrhage. Mild hyperdensity in the right upper pole renal collecting system, new from the prior and possibly reflecting hemorrhage/clot. Correlate for hematuria. Multifocal pneumonia, lower lobe predominant, unchanged. Small left pleural effusion. Additional stable ancillary findings as above. Electronically Signed   By: Julian Hy M.D.   On: 09/24/2020 12:08    Labs:  CBC: Recent Labs    10/15/20 0500 10/16/20 0310 10/17/20 0631 10/18/20 0248  WBC 15.2* 16.5* 15.0* 17.9*  HGB 7.3* 7.3* 7.3* 7.4*  HCT 24.1* 24.6* 24.7* 25.2*  PLT 206 249 294 260    COAGS: Recent Labs    09/09/20 0755 09/13/20 0438 09/15/20 1119 09/21/20 0919  INR 1.9* 1.0 1.2 1.1  APTT 46* 31 42* 33    BMP: Recent Labs    10/16/20 1717 10/17/20 0631 10/17/20 1739 10/18/20 0248  NA 132* 133* 129* 133*  K 4.0 4.3 4.8 5.3*  CL 97* 98 96* 98  CO2 24 25 25 23   GLUCOSE 308* 267* 277* 144*  BUN 51* 49* 54* 50*   CALCIUM 10.4* 10.2 10.2 10.2  CREATININE 0.67 0.65 0.59 0.57  GFRNONAA >60 >60 >60 >60    LIVER FUNCTION TESTS: Recent Labs    09/12/20 0252 09/12/20 1706 09/13/20 0306 09/13/20 1644 09/14/20 0520 09/14/20 1618 09/15/20 0443 09/15/20 1552 10/16/20 1717 10/17/20 0631 10/17/20 1739 10/18/20 0248  BILITOT 0.9  --  1.2  --  0.5  --  1.1  --   --   --   --   --   AST 448*  --  232*  --  133*  --  127*  --   --   --   --   --   ALT 1,542*  --  964*  --  616*  --  515*  --   --   --   --   --   ALKPHOS 238*  --  237*  --  240*  --  287*  --   --   --   --   --   PROT 4.9*  --  4.8*  --  4.8*  --  5.1*  --   --   --   --   --  ALBUMIN 1.7*  1.7*   < > 1.5*   < > 1.5*   < > 1.4*   < > 2.5* 2.6* 2.5* 2.5*   < > = values in this interval not displayed.    TUMOR MARKERS: No results for input(s): AFPTM, CEA, CA199, CHROMGRNA in the last 8760 hours.  Assessment and Plan:  AKI: Likely from ATN associated with multifactorial shock-septic shock; MRSA pneumonia as well as hemorrhagic shock.  She is anuric and without any evidence of renal recovery Existing Rt IJ temp cath Scheduled in IR 12/21 for conversion to tunneled HD cath vs new placement Risks and benefits discussed with the patients daughter [name] via phone including, but not limited to bleeding, infection, vascular injury, pneumothorax which may require chest tube placement, air embolism or even death  All questions were answered, she is agreeable to proceed. Consent signed and in chart.  Thank you for this interesting consult.  I greatly enjoyed meeting Hadasah Brugger and look forward to participating in their care.  A copy of this report was sent to the requesting provider on this date.  Electronically Signed: Lavonia Drafts, PA-C 10/18/2020, 2:33 PM   I spent a total of 20 Minutes    in face to face in clinical consultation, greater than 50% of which was counseling/coordinating care for tunneled HD cath  placement

## 2020-10-18 NOTE — Progress Notes (Addendum)
Physical Therapy Wound Treatment Patient Details  Name: Candace [name] MRN: [id number] Date of Birth: 1951-05-03  Today's Date: 10/18/2020 Time: 5465-6812 Time Calculation (min): 39 min  Subjective  Subjective: Lethargic on vent with intermittent eyes open. Patient and Family Stated Goals: Heal wound; maintain pt comfort per daughter Date of Onset:  (Unknown)  Pain Score:  Pt premedicated for pain. Appeared to tolerate well judging by faces pain scale 0/10.   Wound Assessment  Pressure Injury 09/25/20 Sacrum Bilateral;Medial Deep Tissue Pressure Injury - Purple or maroon localized area of discolored intact skin or blood-filled blister due to damage of underlying soft tissue from pressure and/or shear. Purple, non-blanchable, b (Active)  Dressing Type ABD;Barrier Film (skin prep);Gauze (Comment);Moist to dry 10/18/20 1011  Dressing Changed;Clean;Dry;Intact 10/18/20 1011  Dressing Change Frequency Daily 10/18/20 1011  State of Healing Eschar 10/18/20 1011  Site / Wound Assessment Black;Purple;Red 10/18/20 1011  % Wound base Red or Granulating 20% 10/18/20 1011  % Wound base Yellow/Fibrinous Exudate 0% 10/18/20 1011  % Wound base Black/Eschar 80% 10/18/20 1011  % Wound base Other/Granulation Tissue (Comment) 0% 10/18/20 1011  Peri-wound Assessment Excoriated 10/18/20 1011  Wound Length (cm) 13 cm 10/13/20 1200  Wound Width (cm) 12.5 cm 10/13/20 1200  Wound Depth (cm) 0.1 cm 10/13/20 1200  Wound Surface Area (cm^2) 162.5 cm^2 10/13/20 1200  Wound Volume (cm^3) 16.25 cm^3 10/13/20 1200  Tunneling (cm) 0 10/13/20 1200  Undermining (cm) 0 10/13/20 1200  Margins Unattached edges (unapproximated) 10/18/20 1011  Drainage Amount Minimal 10/18/20 1011  Drainage Description Serosanguineous 10/18/20 1011  Treatment Debridement (Selective);Hydrotherapy (Pulse lavage);Packing (Saline gauze) 10/18/20 1011   Santyl applied to wound bed prior to applying dressing.     Hydrotherapy Pulsed lavage  therapy - wound location: sacrum Pulsed Lavage with Suction (psi): 8 psi (8-12) Pulsed Lavage with Suction - Normal Saline Used: 1000 mL Pulsed Lavage Tip: Tip with splash shield Selective Debridement Selective Debridement - Location: sacrum Selective Debridement - Tools Used: Forceps;Scalpel;Scissors Selective Debridement - Tissue Removed: eschar; non-viable tissue   Wound Assessment and Plan  Wound Therapy - Assess/Plan/Recommendations Wound Therapy - Clinical Statement: Progresing with debridement this session. Most tissue appears to be necrotic adipose and continuing to remove eschar as well. This patient will benefit from continued hydrotherapy for selective removal of unviable tissue, to decrease bioburden and promote wound bed healing. Wound Therapy - Functional Problem List: Global weakness and immobility Factors Delaying/Impairing Wound Healing: Diabetes Mellitus;Immobility;Multiple medical problems Hydrotherapy Plan: Debridement;Dressing change;Patient/family education;Pulsatile lavage with suction Wound Therapy - Frequency: 6X / week Wound Therapy - Follow Up Recommendations: Skilled nursing facility Wound Plan: See above  Wound Therapy Goals- Improve the function of patient's integumentary system by progressing the wound(s) through the phases of wound healing (inflammation - proliferation - remodeling) by: Decrease Necrotic Tissue to: 20% Decrease Necrotic Tissue - Progress: Progressing toward goal Increase Granulation Tissue to: 80% Increase Granulation Tissue - Progress: Progressing toward goal Goals/treatment plan/discharge plan were made with and agreed upon by patient/family: No, Patient unable to participate in goals/treatment/discharge plan and family unavailable Time For Goal Achievement: 7 days Wound Therapy - Potential for Goals: Good  Goals will be updated until maximal potential achieved or discharge criteria met.  Discharge criteria: when goals achieved,  discharge from hospital, MD decision/surgical intervention, no progress towards goals, refusal/missing three consecutive treatments without notification or medical reason.  GP     Thelma Comp 10/18/2020, 10:17 AM   Rolinda Roan, PT, DPT Acute Rehabilitation Services Pager:  [phone] Office: [phone]

## 2020-10-18 NOTE — Progress Notes (Signed)
Patient ID: Glennie Isle, female   DOB: 09/22/1951, 69 y.o.   MRN: [id number] S: Minimally responsive while on vent via trach.  Seen and examined while on CRRT. O:BP 105/63   Pulse (!) 116   Temp 98.1 F (36.7 C) (Oral)   Resp (!) 38   Ht _0  (1.727 m)   Wt 78.9 kg   SpO2 100%   BMI 26.45 kg/m   Intake/Output Summary (Last 24 hours) at 10/18/2020 1436 Last data filed at 10/18/2020 1400 Gross per 24 hour  Intake 2603.02 ml  Output 3229 ml  Net -625.98 ml   Intake/Output: I/O last 3 completed shifts: In: 2871.3 [I.V.:856.3; NG/GT:2015] Out: 2876 [Other:3355; Stool:900]  Intake/Output this shift:  Total I/O In: 1118.3 [I.V.:138.3; NG/GT:980] Out: 1296 [Other:1121; Stool:175] Weight change: 0 kg Gen: on vent via trach, flat affect, not interactive this morning CVS: tachy at 116 Resp: occ rhonchi Abd: +BS, soft, NT Ext: trace edema  Recent Labs  Lab 10/15/20 0500 10/15/20 1900 10/16/20 0310 10/16/20 1717 10/17/20 0631 10/17/20 1739 10/18/20 0248  NA 132* 133* 129* 132* 133* 129* 133*  K 4.9 4.5 5.4* 4.0 4.3 4.8 5.3*  CL 98 98 97* 97* 98 96* 98  CO2 _1 GLUCOSE 268* 281* 346* 308* 267* 277* 144*  BUN 33* 44* 51* 51* 49* 54* 50*  CREATININE 0.63 0.64 0.62 0.67 0.65 0.59 0.57  ALBUMIN 2.4* 2.1* 2.4* 2.5* 2.6* 2.5* 2.5*  CALCIUM 10.3 9.7 9.8 10.4* 10.2 10.2 10.2  PHOS 3.9 4.0 4.2 3.9 3.3 3.4 2.9   Liver Function Tests: Recent Labs  Lab 10/17/20 0631 10/17/20 1739 10/18/20 0248  ALBUMIN 2.6* 2.5* 2.5*   No results for input(s): LIPASE, AMYLASE in the last 168 hours. No results for input(s): AMMONIA in the last 168 hours. CBC: Recent Labs  Lab 10/14/20 0320 10/15/20 0500 10/16/20 0310 10/17/20 0631 10/18/20 0248  WBC 16.5* 15.2* 16.5* 15.0* 17.9*  HGB 7.7* 7.3* 7.3* 7.3* 7.4*  HCT 25.9* 24.1* 24.6* 24.7* 25.2*  MCV 97.4 94.9 96.1 97.6 98.4  PLT 210 206 249 294 260   Cardiac Enzymes: No results for input(s): CKTOTAL, CKMB,  CKMBINDEX, TROPONINI in the last 168 hours. CBG: Recent Labs  Lab 10/17/20 1945 10/17/20 2329 10/18/20 0350 10/18/20 0757 10/18/20 1129  GLUCAP 270* 253* 113* 105* 214*    Iron Studies: No results for input(s): IRON, TIBC, TRANSFERRIN, FERRITIN in the last 72 hours. Studies/Results: DG Chest Port 1 View  Result Date: 10/17/2020 CLINICAL DATA:  Respiratory failure. EXAM: PORTABLE CHEST 1 VIEW COMPARISON:  Chest radiograph 10/15/2020. FINDINGS: Tracheostomy tube mid trachea. Enteric tube courses inferior to the diaphragm. Central venous catheter tip projects over the superior vena cava. Monitoring leads overlie the patient. Stable cardiac and mediastinal contours. Similar-appearing bilateral mid lower lung heterogeneous opacities. Small bilateral pleural effusions. No pneumothorax. IMPRESSION: Similar-appearing bilateral mid and lower lung airspace opacities. Small bilateral pleural effusions. Electronically Signed   By: Lovey Newcomer M.D.   On: 10/17/2020 10:57   . B-complex with vitamin C  1 tablet Per Tube Daily  . chlorhexidine gluconate (MEDLINE KIT)  15 mL Mouth Rinse BID  . Chlorhexidine Gluconate Cloth  6 each Topical Daily  . clonazepam  1 mg Per Tube BID  . collagenase   Topical Daily  . darbepoetin (ARANESP) injection - DIALYSIS  60 mcg Subcutaneous Q Tue-1800  . docusate  100 mg Per Tube BID  . feeding supplement (PROSource TF)  90 mL Per Tube TID  . guaiFENesin  15 mL Per Tube Q6H  . hydrocortisone sod succinate (SOLU-CORTEF) inj  50 mg Intravenous Q12H  . insulin aspart  0-15 Units Subcutaneous Q4H  . insulin aspart  7 Units Subcutaneous Q4H  . insulin glargine  40 Units Subcutaneous Daily  . mouth rinse  15 mL Mouth Rinse QID  . midodrine  20 mg Per Tube TID WC  . nutrition supplement (JUVEN)  1 packet Per Tube BID BM  . oxyCODONE  5 mg Per Tube Q4H  . pantoprazole sodium  40 mg Per Tube BID  . polyethylene glycol  17 g Per Tube Daily  . QUEtiapine  100 mg Per Tube  BID  . sodium chloride flush  10-40 mL Intracatheter Q12H    BMET    Component Value Date/Time   NA 133 (L) 10/18/2020 0248   K 5.3 (H) 10/18/2020 0248   CL 98 10/18/2020 0248   CO2 23 10/18/2020 0248   GLUCOSE 144 (H) 10/18/2020 0248   BUN 50 (H) 10/18/2020 0248   CREATININE 0.57 10/18/2020 0248   CALCIUM 10.2 10/18/2020 0248   GFRNONAA >60 10/18/2020 0248   CBC    Component Value Date/Time   WBC 17.9 (H) 10/18/2020 0248   RBC 2.56 (L) 10/18/2020 0248   HGB 7.4 (L) 10/18/2020 0248   HCT 25.2 (L) 10/18/2020 0248   PLT 260 10/18/2020 0248   MCV 98.4 10/18/2020 0248   MCH 28.9 10/18/2020 0248   MCHC 29.4 (L) 10/18/2020 0248   RDW 15.8 (H) 10/18/2020 0248   LYMPHSABS 0.4 (L) 10/04/2020 0516   MONOABS 0.2 10/04/2020 0516   EOSABS 0.0 10/04/2020 0516   BASOSABS 0.0 10/04/2020 0516    Assessment/Plan:  1. AKI- oliguric presumably due to ischemic ATN in setting of hemorrhagic shock. Started on CRRT 09/08/20 due to persistent oliguria and hyperkalemia. 1. All fluids 4K/2.5Ca: Pre-filter 400 ml/hr, post-filter 200 ml/hr, dialysate 1,000 ml/hr 2. Due to hyperkalemia will increase pre-filter to 500 ml/hr, post-filter 300 ml/hr, and dialysate to 1500 ml/hr.  If still elevated later today will change dialysate to 2K/2.5C 3. No anticoagulation due to thrombocytopenia and ongoing GI blood loss. 4. Continue with current CRRT settings.  5. RSCV HD catheter placed 09/29/20.  Will need to be exchanged.  Will consult IR for tunneled HD catheter 2. Hemorrhagic shock- ongoing blood loss and requiring pressors. 1. Transfuse as needed 3. Septic shock due to MRSA pneumonia. 4. Perforated duodenal ulcer- s/p exploratory lap and Graham patch placement per surgery. 5. Acute hypoxic/hypercapnic respiratory failure due to ARDS from Recent Covid-19 PNA- currently on vent via trach per PCCM.  6. Thrombocytopenia- follow 7. P. Atrial fibrillation- no anticoagulation due to GI bleed. 8. Vascular  access- RIJ trialysis catheter 09/08/20 then removed. Left subclavian trialysis catheter placed 09/15/20 andremoved and new right  HD catheter placed on 09/29/20.  Will need new HD catheter and discussed with PCCM and will consult IR for [facility] placement given lack of renal recovery.  9. Severe protein malnutrition- per primary svc. 10. Disposition- poor overall prognosis and will likely require LTC and chronic vent support. PCCM to discuss goals of care with family.  Donetta Potts, MD [employer] [phone]

## 2020-10-18 NOTE — Progress Notes (Signed)
NAMEDelania [name], MRN:  [id number], DOB:  08-13-1951, LOS: 16 ADMISSION DATE:  08/31/2020, CONSULTATION DATE:  09/07/2020 REFERRING MD:  Dr Candiss Norse, CHIEF COMPLAINT:  Acute resp failure  Brief History   69 year old female who was previously diagnosed with Covid 08/23/2020.  Admitted 11/2 with AF-RVR, found to have a perforated duodenal underwent exploratory laparotomy with Phillip Heal patch placement.  Past Medical History  Covid pneumonia Atrial fibrillation CKD stage III Diabetes mellitus Hypertension Colon cancer Hyperlipidemia  Significant Hospital Events   11/2 Admitted  11/3 OR -> perforated duodenal ulcer 11/10 progressive hemorrhagic shock, intubated, transfused, pressors, proned; started on CRRT in PM 11/16 Extubated. Re-intubated overnight due to respiratory distress and hypoxia with decreased mentation 11/18  bronch'd/ cultures sent 11/19 hgb down getting blood 09/22/2020 spiked fever resume empirical antimicrobial therapy 11/26: hemorrhagic shock, hgb 5.6, increased pressors, CT A/P  11/30 per palliative "[name] expresses understanding that patient is unfortunately very fragile despite ongoing intensive medical treatment and full mechanical support. She indicates that the family wants to continue with all current interventions despite potential outcomes". 12/8: CRRT discontinued due to clotting 12/9 family requested transfer to tertiary care [facility]). They did not feel that they could offer further treatment and denied transfer  12/10-12/12: CRRT restarted. Continues to have episodes of tachycardia, tachypnea that seem to improve with pain management 12/11 back in shock. Pressor requirements  Up. CXR worse. ABX resumed 12/12 still requiring inc pressors. Had hypoglycemic event. Basal insulin dosing adjusted 12/13 pressor requirements better. Now hyperglycemic. Re-adjusted Glycemic control  12/14 changed dilaudid to 1/2 dosing from day further. Dc vasopressin.  Goals of care  reconfirmed with daughter.  Patient continues to desire aggressive care.  Not open to discussing any other option, patient family continues to be hopeful that she will be discharged to home with full recovery in spite of multiple attempts by staff to prepare them that this is unlikely scenario 12/15 Dilaudid discontinued sending cortisol for ongoing pressor dependence 12/16 serum cortisol less than 20, adding stress dose steroids to see if this helps with get off pressors.  Making small adjustment and as needed Dilaudid, attempting to not go back on Dilaudid infusion 12/17: Developed worsening tachycardia during the evening hours received initially IV albumin, followed by resuming IV Dilaudid with question of suboptimal pain control.  Currently looks better back on Dilaudid drip.  We have been able to wean pressors after adding stress dose steroids; near arrest - bradycardia, better with atropine   12/19 - Afebrile . Remains on dilaudid and heparin gtt, dilaudid gtt increased overnight for concern of pain/ ongoing tachycardia, no other events . NE and precedex off 12/17Ongoing CRRT- even UF, dosing lokelmia this morning    Consults:  Cardiology [facility] Nephrology   Procedures:  11/3 Exploratory laparotomy, Phillip Heal patch, lysis of adhesion for duodenal ulceration postop day 6  R PICC 11/5 >> A line 11/9 >> out ETT 11/9 > 11/16, 11/16 >> 09/21/2020 09/21/2020 tracheostomy>> Lt [place] CVL 11/9 >> R IJ trialysis >> out HD catheter 12/1 >>   Significant Diagnostic Tests:  11/3 CT abd/ pelvis > 1. Positive for bowel perforation: Pneumoperitoneum and intermediate density free fluid in the abdomen. Prior total colectomy. The specific site of perforation is unclear-oral contrast present to the proximal jejunum has not obviously leaked. Note that there may be small bowel loops adherent to the ventral abdominal wall along the greater curve of the stomach. 2. Extensive bilateral lower lung pneumonia. No  pleural effusion. 3. Other  abdominal and pelvic viscera are stable since 2015, including bilateral adrenal adenomas. Chronic renal parapelvic cysts. 4. Aortic Atherosclerosis 11/3 TTE > EF 70-75%, RV not well visualized, mildly reduced RV systolic function 69/67 CT chest/ abd/ pelvis> 1. Interval progression of diffuse bilateral hazy ground-glass airspace opacities with more focal areas of consolidation at the lung bases 2. Trace bilateral pleural effusions. 3. Postsurgical changes the abdomen as detailed above. No evidence for a postoperative abscess, however evaluation is limited by lack of IV contrast. 4. There is a 1.9 cm cystic appearing lesion located in the pancreatic body. This was not present on the patient's CT from 2015.  Follow-up with an outpatient contrast enhanced MRI is recommended. 5. The endometrial stripe appears diffusely thickened. Follow-up with pelvic ultrasound is recommended. Aortic Atherosclerosis 11/14 LE doppler studies > + DVT of right posterior tibial and peroneal vein, +dVT of left posterior tibial vein  11/26 CT ABD PEL > liver unremarkable, distended gallbladder with layering tiny gallstones versus sludge, no duct dilatation, mild hyperdensity right upper pole renal collecting system new.  No evidence of retroperitoneal bleeding.  Multifocal lower lobe predominant pulmonary infiltrates/pneumonia.  Small left pleural effusion.  Micro Data:  11/10 MRSA PCR > neg 11/10 BC x 2 > neg 09/22/2020 blood cultures x2>> S epi.  09/22/2020 sputum culture>> MRSA Blood 12/1 >> negative. S.epi 1 out of 2, likely contaminant BCx 2 12/12 >> neg  Antimicrobials:  azithro 11/2 >11/3 Ceftriaxone 11/2  Fluconazole 11/3 Zosyn 11/3 >> 11/7 Vanc 11/10 off Cefepime 11/10 > 11/16 09/22/2020 vancomycin for MRSA PNA >> 12/2  Vanc 12/11> 12/17 Zosyn 12/11> 12/17  Subjective:    10/18/2020= - on trach. On CRRT. On ostomy. Renal plans for HD cath and moving to HD. Gets wonds  care  Objective   Blood pressure 93/60, pulse (!) 117, temperature 98.1 F (36.7 C), temperature source Oral, resp. rate (!) 30, height 5\' 8"  (1.727 m), weight 78.9 kg, SpO2 100 %.    Vent Mode: PRVC FiO2 (%):  [40 %] 40 % Set Rate:  [28 bmp] 28 bmp Vt Set:  [400 mL-450 mL] 450 mL PEEP:  [5 cmH20] 5 cmH20 Plateau Pressure:  [21 cmH20-25 cmH20] 21 cmH20   Intake/Output Summary (Last 24 hours) at 10/18/2020 1146 Last data filed at 10/18/2020 1105 Gross per 24 hour  Intake 2462 ml  Output 2918 ml  Net -456 ml   Filed Weights   10/16/20 0400 10/17/20 0500 10/18/20 0423  Weight: 80.5 kg 78.9 kg 78.9 kg   Physical Exam: General:  Chronically ill appearing female in NAD HEENT: MM pink/moist, pupils 4/reactive, anicteric, midline trach, left cortrak  Neuro: Awakens to verbal, follows simple commands, able to wiggle toes and squeeze hands, attempts to nod- shakes no to complaints of pain CV: rr, ST, no murmur PULM:  Non labored, MV, coarse  GI: soft, bs +, ostomy with brown output, dry abd dressing   Extremities: warm/dry, anasarca, +2 pulses  Skin: no rashes   No change ni exam - above is yesterday      Previously hemorrhagic shock requiring PRBCs, Septic shock, MRSA pneumonia s/p 8 days abx  Assessment & Plan:   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, status post tracheostomy Complicated by healthcare associated pneumonia due to MRSA  10/18/2020 - remains on trach  Plan PRVC via trach Continue to slowly wean PEEP/ FiO2 remains 40% VAP bundle RASS goal -1/-2 with dilaudid gtt/ enteral sedation   Recurrent  septic shock 12/11-->working dx recurrent HCAP c/b adrenal insuff - Completed 7 days of vanc and zosyn 12/17. Culture negative. Off pressoprs  plan Start weaning stress dose steroids since 10/17/20 Continue midodrine 20 mg TID    Ongoing leukocytosis, stable trend, but remains afebrile, likely exacerbated by  steroids  Plan  - monitor   Acute metabolic encephalopathy in the setting of renal failure, shock, sepsis -back on dilaudid gtt 12/17  10/18/2020 - not agitated  Plan Continue current Seroquel, clonazepam, increase oxy to 10mg  q 8 hr to wean dilaudid gtt (restarted 12/17 due to concern of possible narcotic withdrawal/ tachycardia)  Off precedex  Continue neuro checks   AKI, oliguric/anuric Hyperkalemia  --Nephrology consulted. Deemed NOT long term HD candidate.  --CRRT started on 11/10; held 12/8; resumed on 12/10  Plan CRRT per nephrology -> ? HD plans per renal     Duodenal ulcer perforation status post ex lap and Graham's patch --Appreciate CTS assistance and management Plan Continue wound care   Paroxysmal A. fib, currently in sinus rhythm --Sinus tachycardia 12/17 (? Pain) Plan IV heparin Trending H/H- stable, no evidence of bleeding   Diabetes type 2 w/ marked Hyperglycemia.  Much worse on steroids Plan Blood glucose goal 140-180  Continuing sliding scale insulin moderate Adjusted Lantus 12/18, glucose spike after adding stress dose steroids  Has been sensitive to changes Continue TF coverage 5 units q 4 Weaning steroids today which should help   Indeterminate bilateral DVT Plan IV heparin  History of stage IV colon cancer Status post pulmonary metastatectomies at [facility] + Chemo 1.9 cm cystic appearing lesion located in the pancreatic body Plan Oncology follow-up if survives  Best practice:  Diet: Tube feeds, these are being tolerated Pain/Anxiety/Delirium protocol (if indicated) dilaudid gtt/ enteral sedation-  clonazepam, oxy, seroquel VAP protocol (if indicated): yes DVT prophylaxis: IV heparin GI prophylaxis: Protonix Glucose control: SSI, TF coverage, lantus Mobility: Bedrest Code Status: Full Goals of care discussion last completed on 12/14 with daughter, this was a multidisciplinary discussion including nurse practitioner, bedside nurse, and  the patient's daughter.  There have been no changes in goals of care, family continues to desire any and all therapies, remains full code, family remains hopeful that she will survive and be discharged to home  Next multidisciplinary goals of care discussion due 12/21  Daughter, Briant Cedar asking to speak to physician specifically 12/18 at [phone]       ATTESTATION & SIGNATURE    Dr. Brand Males, M.D., F.C.C.P Pulmonary and Critical Care Medicine Staff Physician [facility] Pager: [phone], If no answer or between  15:00h - 7:00h: call [phone]  10/18/2020 11:49 AM    LABS    PULMONARY Recent Labs  Lab 10/18/20 0507  PHART 7.387  PCO2ART 42.4  PO2ART 105  HCO3 24.9  O2SAT 98.4    CBC Recent Labs  Lab 10/16/20 0310 10/17/20 0631 10/18/20 0248  HGB 7.3* 7.3* 7.4*  HCT 24.6* 24.7* 25.2*  WBC 16.5* 15.0* 17.9*  PLT 249 294 260    COAGULATION No results for input(s): INR in the last 168 hours.  CARDIAC  No results for input(s): TROPONINI in the last 168 hours. No results for input(s): PROBNP in the last 168 hours.   CHEMISTRY Recent Labs  Lab 10/14/20 0320 10/14/20 1755 10/15/20 0500 10/15/20 1900 10/16/20 0310 10/16/20 1717 10/17/20 0631 10/17/20 1739 10/18/20 0248  NA 132*   < > 132*   < >  129* 132* 133* 129* 133*  K 5.2*   < > 4.9   < > 5.4* 4.0 4.3 4.8 5.3*  CL 99   < > 98   < > 97* 97* 98 96* 98  CO2 24   < > 24   < > 24 24 25 25 23   GLUCOSE 153*   < > 268*   < > 346* 308* 267* 277* 144*  BUN 22   < > 33*   < > 51* 51* 49* 54* 50*  CREATININE 0.50   < > 0.63   < > 0.62 0.67 0.65 0.59 0.57  CALCIUM 9.7   < > 10.3   < > 9.8 10.4* 10.2 10.2 10.2  MG 2.4  --  2.5*  --  2.6*  --  2.6*  --  2.4  PHOS 3.2   < > 3.9   < > 4.2 3.9 3.3 3.4 2.9   < > = values in this interval not displayed.   Estimated Creatinine Clearance: 73.2 mL/min (by C-G formula based on SCr of 0.57  mg/dL).   LIVER Recent Labs  Lab 10/16/20 0310 10/16/20 1717 10/17/20 0631 10/17/20 1739 10/18/20 0248  ALBUMIN 2.4* 2.5* 2.6* 2.5* 2.5*     INFECTIOUS No results for input(s): LATICACIDVEN, PROCALCITON in the last 168 hours.   ENDOCRINE CBG (last 3)  Recent Labs    10/18/20 0350 10/18/20 0757 10/18/20 1129  GLUCAP 113* 105* 214*         IMAGING x48h  - image(s) personally visualized  -   highlighted in bold DG Chest Port 1 View  Result Date: 10/17/2020 CLINICAL DATA:  Respiratory failure. EXAM: PORTABLE CHEST 1 VIEW COMPARISON:  Chest radiograph 10/15/2020. FINDINGS: Tracheostomy tube mid trachea. Enteric tube courses inferior to the diaphragm. Central venous catheter tip projects over the superior vena cava. Monitoring leads overlie the patient. Stable cardiac and mediastinal contours. Similar-appearing bilateral mid lower lung heterogeneous opacities. Small bilateral pleural effusions. No pneumothorax. IMPRESSION: Similar-appearing bilateral mid and lower lung airspace opacities. Small bilateral pleural effusions. Electronically Signed   By: Lovey Newcomer M.D.   On: 10/17/2020 10:57

## 2020-10-18 NOTE — Plan of Care (Signed)
Problem: Education: Goal: Utilization of techniques to improve thought processes will improve 10/18/2020 0510 by Colvin Caroli, RN Outcome: Not Progressing 10/18/2020 0509 by Colvin Caroli, RN Outcome: Progressing Goal: Knowledge of the prescribed therapeutic regimen will improve 10/18/2020 0510 by Colvin Caroli, RN Outcome: Not Progressing 10/18/2020 0509 by Colvin Caroli, RN Outcome: Progressing   Problem: Activity: Goal: Interest or engagement in leisure activities will improve 10/18/2020 0510 by Colvin Caroli, RN Outcome: Not Progressing 10/18/2020 0509 by Colvin Caroli, RN Outcome: Progressing Goal: Imbalance in normal sleep/wake cycle will improve 10/18/2020 0510 by Colvin Caroli, RN Outcome: Not Progressing 10/18/2020 0509 by Colvin Caroli, RN Outcome: Progressing   Problem: Coping: Goal: Coping ability will improve 10/18/2020 0510 by Colvin Caroli, RN Outcome: Not Progressing 10/18/2020 0509 by Colvin Caroli, RN Outcome: Progressing Goal: Will verbalize feelings 10/18/2020 0510 by Colvin Caroli, RN Outcome: Not Progressing 10/18/2020 0509 by Colvin Caroli, RN Outcome: Progressing   Problem: Health Behavior/Discharge Planning: Goal: Ability to make decisions will improve 10/18/2020 0510 by Colvin Caroli, RN Outcome: Not Progressing 10/18/2020 0509 by Colvin Caroli, RN Outcome: Progressing Goal: Compliance with therapeutic regimen will improve 10/18/2020 0510 by Colvin Caroli, RN Outcome: Not Progressing 10/18/2020 0509 by Colvin Caroli, RN Outcome: Progressing   Problem: Role Relationship: Goal: Will demonstrate positive changes in social behaviors and relationships 10/18/2020 0510 by Colvin Caroli, RN Outcome: Not Progressing 10/18/2020 0509 by Colvin Caroli, RN Outcome: Progressing   Problem: Safety: Goal: Ability to identify and utilize support systems that promote safety will  improve 10/18/2020 0510 by Colvin Caroli, RN Outcome: Not Progressing 10/18/2020 0509 by Colvin Caroli, RN Outcome: Progressing   Problem: Self-Concept: Goal: Level of anxiety will decrease 10/18/2020 0510 by Colvin Caroli, RN Outcome: Not Progressing 10/18/2020 0509 by Colvin Caroli, RN Outcome: Progressing   Problem: Education: Goal: Knowledge of General Education information will improve Description: Including pain rating scale, medication(s)/side effects and non-pharmacologic comfort measures 10/18/2020 0510 by Colvin Caroli, RN Outcome: Not Progressing 10/18/2020 0509 by Colvin Caroli, RN Outcome: Progressing   Problem: Health Behavior/Discharge Planning: Goal: Ability to manage health-related needs will improve 10/18/2020 0510 by Colvin Caroli, RN Outcome: Not Progressing 10/18/2020 0509 by Colvin Caroli, RN Outcome: Progressing   Problem: Clinical Measurements: Goal: Ability to maintain clinical measurements within normal limits will improve 10/18/2020 0510 by Colvin Caroli, RN Outcome: Not Progressing 10/18/2020 0509 by Colvin Caroli, RN Outcome: Progressing Goal: Will remain free from infection 10/18/2020 0510 by Colvin Caroli, RN Outcome: Not Progressing 10/18/2020 0509 by Colvin Caroli, RN Outcome: Progressing Goal: Diagnostic test results will improve 10/18/2020 0510 by Colvin Caroli, RN Outcome: Not Progressing 10/18/2020 0509 by Colvin Caroli, RN Outcome: Progressing Goal: Respiratory complications will improve 10/18/2020 0510 by Colvin Caroli, RN Outcome: Not Progressing 10/18/2020 0509 by Colvin Caroli, RN Outcome: Progressing Goal: Cardiovascular complication will be avoided 10/18/2020 0510 by Colvin Caroli, RN Outcome: Not Progressing 10/18/2020 0509 by Colvin Caroli, RN Outcome: Progressing   Problem: Activity: Goal: Risk for activity intolerance will decrease 10/18/2020  0510 by Colvin Caroli, RN Outcome: Not Progressing 10/18/2020 0509 by Colvin Caroli, RN Outcome: Progressing   Problem: Nutrition: Goal: Adequate nutrition will be maintained 10/18/2020 0510 by Colvin Caroli, RN Outcome: Not Progressing 10/18/2020 0509 by Colvin Caroli, RN Outcome: Progressing   Problem: Coping:  Goal: Level of anxiety will decrease 10/18/2020 0510 by Colvin Caroli, RN Outcome: Not Progressing 10/18/2020 0509 by Colvin Caroli, RN Outcome: Progressing   Problem: Elimination: Goal: Will not experience complications related to bowel motility 10/18/2020 0510 by Colvin Caroli, RN Outcome: Not Progressing 10/18/2020 0509 by Colvin Caroli, RN Outcome: Progressing Goal: Will not experience complications related to urinary retention 10/18/2020 0510 by Colvin Caroli, RN Outcome: Not Progressing 10/18/2020 0509 by Colvin Caroli, RN Outcome: Progressing   Problem: Pain Managment: Goal: General experience of comfort will improve 10/18/2020 0510 by Colvin Caroli, RN Outcome: Not Progressing 10/18/2020 0509 by Colvin Caroli, RN Outcome: Progressing   Problem: Safety: Goal: Ability to remain free from injury will improve 10/18/2020 0510 by Colvin Caroli, RN Outcome: Not Progressing 10/18/2020 0509 by Colvin Caroli, RN Outcome: Progressing   Problem: Skin Integrity: Goal: Risk for impaired skin integrity will decrease 10/18/2020 0510 by Colvin Caroli, RN Outcome: Not Progressing 10/18/2020 0509 by Colvin Caroli, RN Outcome: Progressing   Problem: Activity: Goal: Ability to tolerate increased activity will improve 10/18/2020 0510 by Colvin Caroli, RN Outcome: Not Progressing 10/18/2020 0509 by Colvin Caroli, RN Outcome: Progressing   Problem: Respiratory: Goal: Ability to maintain a clear airway and adequate ventilation will improve 10/18/2020 0510 by Colvin Caroli, RN Outcome: Not  Progressing 10/18/2020 0509 by Colvin Caroli, RN Outcome: Progressing   Problem: Role Relationship: Goal: Method of communication will improve 10/18/2020 0510 by Colvin Caroli, RN Outcome: Not Progressing 10/18/2020 0509 by Colvin Caroli, RN Outcome: Progressing   Problem: Education: Goal: Knowledge of disease and its progression will improve 10/18/2020 0510 by Colvin Caroli, RN Outcome: Not Progressing 10/18/2020 0509 by Colvin Caroli, RN Outcome: Progressing Goal: Individualized Educational Video(s) 10/18/2020 0510 by Colvin Caroli, RN Outcome: Not Progressing 10/18/2020 0509 by Colvin Caroli, RN Outcome: Progressing   Problem: Fluid Volume: Goal: Compliance with measures to maintain balanced fluid volume will improve 10/18/2020 0510 by Colvin Caroli, RN Outcome: Not Progressing 10/18/2020 0509 by Colvin Caroli, RN Outcome: Progressing   Problem: Health Behavior/Discharge Planning: Goal: Ability to manage health-related needs will improve 10/18/2020 0510 by Colvin Caroli, RN Outcome: Not Progressing 10/18/2020 0509 by Colvin Caroli, RN Outcome: Progressing   Problem: Nutritional: Goal: Ability to make healthy dietary choices will improve 10/18/2020 0510 by Colvin Caroli, RN Outcome: Not Progressing 10/18/2020 0509 by Colvin Caroli, RN Outcome: Progressing   Problem: Clinical Measurements: Goal: Complications related to the disease process, condition or treatment will be avoided or minimized 10/18/2020 0510 by Colvin Caroli, RN Outcome: Not Progressing 10/18/2020 0509 by Colvin Caroli, RN Outcome: Progressing   Problem: Education: Goal: Knowledge of risk factors and measures for prevention of condition will improve 10/18/2020 0510 by Colvin Caroli, RN Outcome: Not Progressing 10/18/2020 0509 by Colvin Caroli, RN Outcome: Progressing   Problem: Coping: Goal: Psychosocial and spiritual needs  will be supported 10/18/2020 0510 by Colvin Caroli, RN Outcome: Not Progressing 10/18/2020 0509 by Colvin Caroli, RN Outcome: Progressing   Problem: Respiratory: Goal: Will maintain a patent airway 10/18/2020 0510 by Colvin Caroli, RN Outcome: Not Progressing 10/18/2020 0509 by Colvin Caroli, RN Outcome: Progressing Goal: Complications related to the disease process, condition or treatment will be avoided or minimized 10/18/2020 0510 by Colvin Caroli, RN Outcome: Not Progressing 10/18/2020 0509 by Colvin Caroli, RN  Outcome: Progressing   

## 2020-10-18 NOTE — Plan of Care (Signed)
  Problem: Education: Goal: Utilization of techniques to improve thought processes will improve Outcome: Progressing Goal: Knowledge of the prescribed therapeutic regimen will improve Outcome: Progressing   Problem: Activity: Goal: Interest or engagement in leisure activities will improve Outcome: Progressing Goal: Imbalance in normal sleep/wake cycle will improve Outcome: Progressing   Problem: Coping: Goal: Coping ability will improve Outcome: Progressing Goal: Will verbalize feelings Outcome: Progressing   Problem: Health Behavior/Discharge Planning: Goal: Ability to make decisions will improve Outcome: Progressing Goal: Compliance with therapeutic regimen will improve Outcome: Progressing   Problem: Role Relationship: Goal: Will demonstrate positive changes in social behaviors and relationships Outcome: Progressing   Problem: Safety: Goal: Ability to identify and utilize support systems that promote safety will improve Outcome: Progressing   Problem: Self-Concept: Goal: Level of anxiety will decrease Outcome: Progressing   Problem: Education: Goal: Knowledge of General Education information will improve Description: Including pain rating scale, medication(s)/side effects and non-pharmacologic comfort measures Outcome: Progressing   Problem: Health Behavior/Discharge Planning: Goal: Ability to manage health-related needs will improve Outcome: Progressing   Problem: Clinical Measurements: Goal: Ability to maintain clinical measurements within normal limits will improve Outcome: Progressing Goal: Will remain free from infection Outcome: Progressing Goal: Diagnostic test results will improve Outcome: Progressing Goal: Respiratory complications will improve Outcome: Progressing Goal: Cardiovascular complication will be avoided Outcome: Progressing   Problem: Activity: Goal: Risk for activity intolerance will decrease Outcome: Progressing   Problem:  Nutrition: Goal: Adequate nutrition will be maintained Outcome: Progressing   Problem: Coping: Goal: Level of anxiety will decrease Outcome: Progressing   Problem: Elimination: Goal: Will not experience complications related to bowel motility Outcome: Progressing Goal: Will not experience complications related to urinary retention Outcome: Progressing   Problem: Pain Managment: Goal: General experience of comfort will improve Outcome: Progressing   Problem: Safety: Goal: Ability to remain free from injury will improve Outcome: Progressing   Problem: Skin Integrity: Goal: Risk for impaired skin integrity will decrease Outcome: Progressing   Problem: Activity: Goal: Ability to tolerate increased activity will improve Outcome: Progressing   Problem: Respiratory: Goal: Ability to maintain a clear airway and adequate ventilation will improve Outcome: Progressing   Problem: Role Relationship: Goal: Method of communication will improve Outcome: Progressing   Problem: Education: Goal: Knowledge of disease and its progression will improve Outcome: Progressing Goal: Individualized Educational Video(s) Outcome: Progressing   Problem: Fluid Volume: Goal: Compliance with measures to maintain balanced fluid volume will improve Outcome: Progressing   Problem: Health Behavior/Discharge Planning: Goal: Ability to manage health-related needs will improve Outcome: Progressing   Problem: Nutritional: Goal: Ability to make healthy dietary choices will improve Outcome: Progressing   Problem: Clinical Measurements: Goal: Complications related to the disease process, condition or treatment will be avoided or minimized Outcome: Progressing   Problem: Education: Goal: Knowledge of risk factors and measures for prevention of condition will improve Outcome: Progressing   Problem: Coping: Goal: Psychosocial and spiritual needs will be supported Outcome: Progressing   Problem:  Respiratory: Goal: Will maintain a patent airway Outcome: Progressing Goal: Complications related to the disease process, condition or treatment will be avoided or minimized Outcome: Progressing

## 2020-10-19 ENCOUNTER — Inpatient Hospital Stay (HOSPITAL_COMMUNITY): Payer: Medicare Other

## 2020-10-19 HISTORY — PX: IR FLUORO GUIDE CV LINE RIGHT: IMG2283

## 2020-10-19 HISTORY — PX: IR US GUIDE VASC ACCESS RIGHT: IMG2390

## 2020-10-19 LAB — CBC
HCT: 24.6 % — ABNORMAL LOW (ref 36.0–46.0)
HCT: 26.7 % — ABNORMAL LOW (ref 36.0–46.0)
Hemoglobin: 7.9 g/dL — ABNORMAL LOW (ref 12.0–15.0)
Hemoglobin: 8 g/dL — ABNORMAL LOW (ref 12.0–15.0)
MCH: 30 pg (ref 26.0–34.0)
MCH: 28.9 pg (ref 26.0–34.0)
MCHC: 32.1 g/dL (ref 30.0–36.0)
MCHC: 30 g/dL (ref 30.0–36.0)
MCV: 93.5 fL (ref 80.0–100.0)
MCV: 96.4 fL (ref 80.0–100.0)
Platelets: 261 10*3/uL (ref 150–400)
Platelets: 288 10*3/uL (ref 150–400)
RBC: 2.63 MIL/uL — ABNORMAL LOW (ref 3.87–5.11)
RBC: 2.77 MIL/uL — ABNORMAL LOW (ref 3.87–5.11)
RDW: 16.1 % — ABNORMAL HIGH (ref 11.5–15.5)
RDW: 16.1 % — ABNORMAL HIGH (ref 11.5–15.5)
WBC: 16.9 10*3/uL — ABNORMAL HIGH (ref 4.0–10.5)
WBC: 16.1 10*3/uL — ABNORMAL HIGH (ref 4.0–10.5)
nRBC: 1.3 % — ABNORMAL HIGH (ref 0.0–0.2)
nRBC: 2 % — ABNORMAL HIGH (ref 0.0–0.2)

## 2020-10-19 LAB — RENAL FUNCTION PANEL
Albumin: 2.3 g/dL — ABNORMAL LOW (ref 3.5–5.0)
Albumin: 2.4 g/dL — ABNORMAL LOW (ref 3.5–5.0)
Anion gap: 8 (ref 5–15)
Anion gap: 10 (ref 5–15)
BUN: 38 mg/dL — ABNORMAL HIGH (ref 8–23)
BUN: 45 mg/dL — ABNORMAL HIGH (ref 8–23)
CO2: 24 mmol/L (ref 22–32)
CO2: 24 mmol/L (ref 22–32)
Calcium: 10 mg/dL (ref 8.9–10.3)
Calcium: 10.1 mg/dL (ref 8.9–10.3)
Chloride: 100 mmol/L (ref 98–111)
Chloride: 97 mmol/L — ABNORMAL LOW (ref 98–111)
Creatinine, Ser: 0.49 mg/dL (ref 0.44–1.00)
Creatinine, Ser: 0.45 mg/dL (ref 0.44–1.00)
GFR, Estimated: >60 mL/min (ref >60)
GFR, Estimated: >60 mL/min (ref >60)
Glucose, Bld: 145 mg/dL — ABNORMAL HIGH (ref 70–99)
Glucose, Bld: 212 mg/dL — ABNORMAL HIGH (ref 70–99)
Phosphorus: 2.7 mg/dL (ref 2.5–4.6)
Phosphorus: 3.1 mg/dL (ref 2.5–4.6)
Potassium: 4.3 mmol/L (ref 3.5–5.1)
Potassium: 4.3 mmol/L (ref 3.5–5.1)
Sodium: 132 mmol/L — ABNORMAL LOW (ref 135–145)
Sodium: 131 mmol/L — ABNORMAL LOW (ref 135–145)

## 2020-10-19 LAB — HEPATIC FUNCTION PANEL
ALT: 85 U/L — ABNORMAL HIGH (ref 0–44)
AST: 74 U/L — ABNORMAL HIGH (ref 15–41)
Albumin: 2.4 g/dL — ABNORMAL LOW (ref 3.5–5.0)
Alkaline Phosphatase: 332 U/L — ABNORMAL HIGH (ref 38–126)
Bilirubin, Direct: <0.1 mg/dL (ref 0.0–0.2)
Total Bilirubin: 0.6 mg/dL (ref 0.3–1.2)
Total Protein: 7.4 g/dL (ref 6.5–8.1)

## 2020-10-19 LAB — HEPARIN LEVEL (UNFRACTIONATED)
Heparin Unfractionated: >2.2 IU/mL — ABNORMAL HIGH (ref 0.30–0.70)
Heparin Unfractionated: 0.39 IU/mL (ref 0.30–0.70)

## 2020-10-19 LAB — GLUCOSE, CAPILLARY
Glucose-Capillary: 124 mg/dL — ABNORMAL HIGH (ref 70–99)
Glucose-Capillary: 144 mg/dL — ABNORMAL HIGH (ref 70–99)
Glucose-Capillary: 159 mg/dL — ABNORMAL HIGH (ref 70–99)
Glucose-Capillary: 164 mg/dL — ABNORMAL HIGH (ref 70–99)
Glucose-Capillary: 190 mg/dL — ABNORMAL HIGH (ref 70–99)
Glucose-Capillary: 196 mg/dL — ABNORMAL HIGH (ref 70–99)

## 2020-10-19 LAB — LACTIC ACID, PLASMA: Lactic Acid, Venous: 1.6 mmol/L (ref 0.5–1.9)

## 2020-10-19 LAB — VITAMIN A: Vitamin A (Retinoic Acid): 42.3 ug/dL (ref 22.0–69.5)

## 2020-10-19 LAB — MAGNESIUM: Magnesium: 2.4 mg/dL (ref 1.7–2.4)

## 2020-10-19 MED ORDER — FENTANYL CITRATE (PF) 100 MCG/2ML IJ SOLN
INTRAMUSCULAR | Status: AC
  Filled 2020-10-19: qty 2

## 2020-10-19 MED ORDER — FENTANYL CITRATE (PF) 100 MCG/2ML IJ SOLN
INTRAMUSCULAR | Status: AC | PRN
  Administered 2020-10-19 (×2): 50 ug via INTRAVENOUS

## 2020-10-19 MED ORDER — MIDAZOLAM HCL 2 MG/2ML IJ SOLN
INTRAMUSCULAR | Status: AC
  Filled 2020-10-19: qty 2

## 2020-10-19 MED ORDER — VITAMIN D 25 MCG (1000 UNIT) PO TABS
2000.0000 [IU] | ORAL_TABLET | Freq: Every day | ORAL | Status: DC
  Administered 2020-10-19 – 2020-11-18 (×31): 2000 [IU]
  Filled 2020-10-19 (×33): qty 2

## 2020-10-19 MED ORDER — CHLORHEXIDINE GLUCONATE 4 % EX LIQD
CUTANEOUS | Status: AC
  Filled 2020-10-19: qty 15

## 2020-10-19 MED ORDER — MIDAZOLAM HCL 2 MG/2ML IJ SOLN
INTRAMUSCULAR | Status: AC | PRN
Start: 2020-10-19 — End: 2020-10-19
  Administered 2020-10-19 (×2): 1 mg via INTRAVENOUS

## 2020-10-19 MED ORDER — LIDOCAINE HCL (PF) 1 % IJ SOLN
INTRAMUSCULAR | Status: AC | PRN
  Administered 2020-10-19: 10 mL

## 2020-10-19 MED ORDER — DEXTROSE 5 % IV SOLN
2.0000 mg | Freq: Every day | INTRAVENOUS | Status: AC
  Administered 2020-10-19 – 2020-10-23 (×5): 2 mg via INTRAVENOUS
  Filled 2020-10-19 (×5): qty 5

## 2020-10-19 MED ORDER — WHITE PETROLATUM EX OINT
TOPICAL_OINTMENT | CUTANEOUS | Status: AC
  Administered 2020-10-19: 0.2
  Filled 2020-10-19: qty 28.35

## 2020-10-19 MED ORDER — CEFAZOLIN SODIUM-DEXTROSE 2-4 GM/100ML-% IV SOLN
INTRAVENOUS | Status: AC
  Filled 2020-10-19: qty 100

## 2020-10-19 MED ORDER — HEPARIN SODIUM (PORCINE) 1000 UNIT/ML IJ SOLN
INTRAMUSCULAR | Status: AC
  Filled 2020-10-19: qty 1

## 2020-10-19 MED ORDER — LIDOCAINE-EPINEPHRINE 1 %-1:100000 IJ SOLN
INTRAMUSCULAR | Status: AC
  Filled 2020-10-19: qty 1

## 2020-10-19 MED ORDER — ATROPINE SULFATE 1 MG/10ML IJ SOSY
PREFILLED_SYRINGE | INTRAMUSCULAR | Status: AC
  Filled 2020-10-19: qty 10

## 2020-10-19 NOTE — Progress Notes (Signed)
Candace [name], MRN:  [id number], DOB:  06-29-1951, LOS: 86 ADMISSION DATE:  08/31/2020, CONSULTATION DATE:  09/07/2020 REFERRING MD:  Dr Candiss Norse, CHIEF COMPLAINT:  Acute resp failure  Brief History   69 year old female who was previously diagnosed with Covid 08/23/2020.  Admitted 11/2 with AF-RVR, found to have a perforated duodenal underwent exploratory laparotomy with Phillip Heal patch placement.  Past Medical History  Covid pneumonia Atrial fibrillation CKD stage III Diabetes mellitus Hypertension Colon cancer Hyperlipidemia  Significant Hospital Events   11/2 Admitted  11/3 OR -> perforated duodenal ulcer 11/10 progressive hemorrhagic shock, intubated, transfused, pressors, proned; started on CRRT in PM 11/16 Extubated. Re-intubated overnight due to respiratory distress and hypoxia with decreased mentation 11/18  bronch'd/ cultures sent 11/19 hgb down getting blood 09/22/2020 spiked fever resume empirical antimicrobial therapy 11/26: hemorrhagic shock, hgb 5.6, increased pressors, CT A/P  11/30 per palliative "[name] expresses understanding that patient is unfortunately very fragile despite ongoing intensive medical treatment and full mechanical support. She indicates that the family wants to continue with all current interventions despite potential outcomes". 12/8: CRRT discontinued due to clotting 12/9 family requested transfer to tertiary care [facility]). They did not feel that they could offer further treatment and denied transfer  12/10-12/12: CRRT restarted. Continues to have episodes of tachycardia, tachypnea that seem to improve with pain management 12/11 back in shock. Pressor requirements  Up. CXR worse. ABX resumed 12/12 still requiring inc pressors. Had hypoglycemic event. Basal insulin dosing adjusted 12/13 pressor requirements better. Now hyperglycemic. Re-adjusted Glycemic control  12/14 changed dilaudid to 1/2 dosing from day further. Dc vasopressin.  Goals of care  reconfirmed with daughter.  Patient continues to desire aggressive care.  Not open to discussing any other option, patient family continues to be hopeful that she will be discharged to home with full recovery in spite of multiple attempts by staff to prepare them that this is unlikely scenario 12/15 Dilaudid discontinued sending cortisol for ongoing pressor dependence 12/16 serum cortisol less than 20, adding stress dose steroids to see if this helps with get off pressors.  Making small adjustment and as needed Dilaudid, attempting to not go back on Dilaudid infusion 12/17: Developed worsening tachycardia during the evening hours received initially IV albumin, followed by resuming IV Dilaudid with question of suboptimal pain control.  Currently looks better back on Dilaudid drip.  We have been able to wean pressors after adding stress dose steroids; near arrest - bradycardia, better with atropine   12/19 - Afebrile . Remains on dilaudid and heparin gtt, dilaudid gtt increased overnight for concern of pain/ ongoing tachycardia, no other events . NE and precedex off 12/17Ongoing CRRT- even UF, dosing lokelmia this morning  12/20 - on trach. On CRRT. On ostomy. Renal plans for HD cath and moving to HD. Gets wonds care  Consults:  Cardiology [facility] Nephrology   Procedures:  11/3 Exploratory laparotomy, Phillip Heal patch, lysis of adhesion for duodenal ulceration postop day 6  R PICC 11/5 >> A line 11/9 >> out ETT 11/9 > 11/16, 11/16 >> 09/21/2020 09/21/2020 tracheostomy>> Lt [place] CVL 11/9 >> R IJ trialysis >> out HD catheter 12/1 >>12/20 12/21 - 14.5 Fr, 23 cm right IJ tunneled hemodialysis catheter placement.  Removal of indwelling subclavian catheter.   Significant Diagnostic Tests:  11/3 CT abd/ pelvis > 1. Positive for bowel perforation: Pneumoperitoneum and intermediate density free fluid in the abdomen. Prior total colectomy. The specific site of perforation is unclear-oral contrast present to  the proximal  jejunum has not obviously leaked. Note that there may be small bowel loops adherent to the ventral abdominal wall along the greater curve of the stomach. 2. Extensive bilateral lower lung pneumonia. No pleural effusion. 3. Other abdominal and pelvic viscera are stable since 2015, including bilateral adrenal adenomas. Chronic renal parapelvic cysts. 4. Aortic Atherosclerosis 11/3 TTE > EF 70-75%, RV not well visualized, mildly reduced RV systolic function 22/63 CT chest/ abd/ pelvis> 1. Interval progression of diffuse bilateral hazy ground-glass airspace opacities with more focal areas of consolidation at the lung bases 2. Trace bilateral pleural effusions. 3. Postsurgical changes the abdomen as detailed above. No evidence for a postoperative abscess, however evaluation is limited by lack of IV contrast. 4. There is a 1.9 cm cystic appearing lesion located in the pancreatic body. This was not present on the patient's CT from 2015.  Follow-up with an outpatient contrast enhanced MRI is recommended. 5. The endometrial stripe appears diffusely thickened. Follow-up with pelvic ultrasound is recommended. Aortic Atherosclerosis 11/14 LE doppler studies > + DVT of right posterior tibial and peroneal vein, +dVT of left posterior tibial vein  11/26 CT ABD PEL > liver unremarkable, distended gallbladder with layering tiny gallstones versus sludge, no duct dilatation, mild hyperdensity right upper pole renal collecting system new.  No evidence of retroperitoneal bleeding.  Multifocal lower lobe predominant pulmonary infiltrates/pneumonia.  Small left pleural effusion.  Micro Data:  11/10 MRSA PCR > neg 11/10 BC x 2 > neg 09/22/2020 blood cultures x2>> S epi.  09/22/2020 sputum culture>> MRSA Blood 12/1 >> negative. S.epi 1 out of 2, likely contaminant BCx 2 12/12 >> neg  Antimicrobials:  azithro 11/2 >11/3 Ceftriaxone 11/2  Fluconazole 11/3 Zosyn 11/3 >> 11/7 Vanc 11/10 off Cefepime  11/10 > 11/16 09/22/2020 vancomycin for MRSA PNA >> 12/2  Vanc 12/11> 12/17 Zosyn 12/11> 12/17  Subjective:    10/19/2020= - has tunneled HD cath R Ij. Still on CRRT - continue for now per renal. On 30% fio3. Via trach  Objective   Blood pressure (!) 88/63, pulse (!) 113, temperature (!) 97.4 F (36.3 C), temperature source Oral, resp. rate (!) 28, height 5' 8"  (1.727 m), weight 78.5 kg, SpO2 99 %.    Vent Mode: PRVC FiO2 (%):  [30 %-100 %] 100 % Set Rate:  [28 bmp] 28 bmp Vt Set:  [450 mL] 450 mL PEEP:  [5 cmH20] 5 cmH20 Plateau Pressure:  [20 FHL45-62 cmH20] 22 cmH20   Intake/Output Summary (Last 24 hours) at 10/19/2020 1212 Last data filed at 10/19/2020 1200 Gross per 24 hour  Intake 2017.75 ml  Output 2458 ml  Net -440.25 ml   Filed Weights   10/17/20 0500 10/18/20 0423 10/19/20 0444  Weight: 78.9 kg 78.9 kg 78.5 kg   Physical Exam: deconditinef female On Bair hugger S/p dilaudid - RASS -3 CTA bialerlly No bleeding      Previously hemorrhagic shock requiring PRBCs, Septic shock, MRSA pneumonia s/p 8 days abx  Assessment & Plan:   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 Culture negative. Off pressoprs  plan Start weaning stress  dose steroids since 10/17/20 Continue midodrine 20 mg TID    Ongoing leukocytosis, stable trend, but remains afebrile, likely exacerbated by steroids  Plan  - monitor   Acute metabolic encephalopathy in the setting of renal failure, shock, sepsis -back on dilaudid gtt 12/17  10/19/2020 - not  agitated  Plan Continue current Seroquel, clonazepam, increase oxy to 48m q 8 hr to wean dilaudid gtt (restarted 12/17 due to concern of possible narcotic withdrawal/ tachycardia)  Off precedex  Continue neuro checks   AKI, oliguric/anuric Hyperkalemia  --Nephrology consulted. Deemed NOT long term HD candidate.  --CRRT started on 11/10; held 12/8; resumed on 12/10  Plan CRRT per nephrology -> ? HD plans per renal - timing TBC    Duodenal ulcer perforation status post ex lap and Graham's patch --Appreciate CTS assistance and management Plan Continue wound care   Paroxysmal A. fib, currently in sinus rhythm --Sinus tachycardia 12/17 (? Pain) Plan IV heparin Trending H/H- stable, no evidence of bleeding   Diabetes type 2 w/ marked Hyperglycemia.  Much worse on steroids Plan Blood glucose goal 140-180  Continuing sliding scale insulin moderate Adjusted Lantus 12/18, glucose spike after adding stress dose steroids  Has been sensitive to changes Continue TF coverage 5 units q 4 Weaning steroids today which should help   Indeterminate bilateral DVT Plan IV heparin  History of stage IV colon cancer [facility] 1.9 cm cystic appearing lesion located in the pancreatic body Plan Oncology follow-up if survives  Best practice:  Diet: Tube feeds, these are being tolerated Pain/Anxiety/Delirium protocol (if indicated) dilaudid gtt/ enteral sedation-  clonazepam, oxy, seroquel VAP protocol (if indicated): yes DVT prophylaxis: IV heparin GI prophylaxis: Protonix Glucose control: SSI, TF coverage, lantus Mobility: Bedrest Code Status: Full Goals of care discussion last completed on 12/14 with daughter, this was a multidisciplinary discussion including nurse practitioner, bedside nurse, and the patient's daughter.  There have been no changes in goals of care, family continues to desire any and all therapies, remains full code, family  remains hopeful that she will survive and be discharged to home  Next multidisciplinary goals of care discussion due 12/21  Daughter, TGilda Creaseasking to speak to physician specifically 12/18 at [phone]-> daughter called 12/21 -> daughter said she spoke to RN this moring post HD cath.        ATTESTATION & SIGNATURE    Dr. MBrand Males M.D., FAvoyelles HospitalC.P Pulmonary and Critical Care Medicine Staff Physician CBendonPulmonary and Critical Care Pager: [phone] If no answer or between  15:00h - 7:00h: call [phone]  10/19/2020 12:12 PM    LABS    PULMONARY Recent Labs  Lab 10/18/20 0507  PHART 7.387  PCO2ART 42.4  PO2ART 105  HCO3 24.9  O2SAT 98.4    CBC Recent Labs  Lab 10/17/20 0631 10/18/20 0248 10/19/20 0457  HGB 7.3* 7.4* 7.9*  HCT 24.7* 25.2* 24.6*  WBC 15.0* 17.9* 16.9*  PLT 294 260 261    COAGULATION No results for input(s): INR in the last 168 hours.  CARDIAC  No results for input(s): TROPONINI in the last 168 hours. No results for input(s): PROBNP in the last 168 hours.   CHEMISTRY Recent Labs  Lab 10/15/20 0500 10/15/20 1900 10/16/20 0310 10/16/20 1717 10/17/20 0631 10/17/20 1739 10/18/20 0248 10/18/20 1605 10/19/20 0457  NA 132*   < > 129*   < > 133* 129* 133*  132* 132*  K 4.9   < > 5.4*   < > 4.3 4.8 5.3* 4.7 4.3  CL 98   < > 97*   < > 98 96* 98 98 100  CO2 24   < > 24   < > 25 25 23 25 24   GLUCOSE 268*   < > 346*   < > 267* 277* 144* 264* 145*  BUN 33*   < > 51*   < > 49* 54* 50* 53* 38*  CREATININE 0.63   < > 0.62   < > 0.65 0.59 0.57 0.54 0.49  CALCIUM 10.3   < > 9.8   < > 10.2 10.2 10.2 10.3 10.0  MG 2.5*  --  2.6*  --  2.6*  --  2.4  --  2.4  PHOS 3.9   < > 4.2   < > 3.3 3.4 2.9 2.7 2.7   < > = values in this interval not displayed.   Estimated Creatinine Clearance: 73 mL/min (by C-G formula based on SCr of 0.49 mg/dL).   LIVER Recent Labs  Lab 10/17/20 0631 10/17/20 1739  10/18/20 0248 10/18/20 1605 10/19/20 0457  ALBUMIN 2.6* 2.5* 2.5* 2.4* 2.3*     INFECTIOUS No results for input(s): LATICACIDVEN, PROCALCITON in the last 168 hours.   ENDOCRINE CBG (last 3)  Recent Labs    10/19/20 0344 10/19/20 0743 10/19/20 1146  GLUCAP 164* 124* 144*         IMAGING x48h  - image(s) personally visualized  -   highlighted in bold IR Fluoro Guide CV Line Right  Result Date: 10/19/2020 INDICATION: 69 year old female with acute on chronic kidney disease requiring permanent hemodialysis access. EXAM: TUNNELED CENTRAL VENOUS HEMODIALYSIS CATHETER PLACEMENT WITH ULTRASOUND AND FLUOROSCOPIC GUIDANCE MEDICATIONS: Ancef 2 gm IV . The antibiotic was given in an appropriate time interval prior to skin puncture. ANESTHESIA/SEDATION: Moderate (conscious) sedation was employed during this procedure. A total of Versed 2 mg and Fentanyl 100 mcg was administered intravenously. Moderate Sedation Time: 12 minutes. The patient's level of consciousness and vital signs were monitored continuously by radiology nursing throughout the procedure under my direct supervision. FLUOROSCOPY TIME:  0 minutes 12 seconds (70 mGy). COMPLICATIONS: None immediate. PROCEDURE: Informed written consent was obtained from the patient after a discussion of the risks, benefits, and alternatives to treatment. Questions regarding the procedure were encouraged and answered. The right neck and chest were prepped with chlorhexidine in a sterile fashion, and a sterile drape was applied covering the operative field. Maximum barrier sterile technique with sterile gowns and gloves were used for the procedure. A timeout was performed prior to the initiation of the procedure. After creating a small venotomy incision, a 21 gauge micropuncture kit was utilized to access the internal jugular vein. Real-time ultrasound guidance was utilized for vascular access including the acquisition of a permanent ultrasound image  documenting patency of the accessed vessel. A Rosen wire was advanced to the level of the IVC and the micropuncture sheath was exchanged for an 8 Fr dilator. A 14.5 French tunneled hemodialysis catheter measuring 23 cm from tip to cuff was tunneled in a retrograde fashion from the anterior chest wall to the venotomy incision. Serial dilation was then performed an a peel-away sheath was placed. The catheter was then placed through the peel-away sheath with the catheter tip ultimately positioned within the right atrium. Final catheter positioning was confirmed and documented with a spot radiographic image. The catheter aspirates and flushes normally.  The catheter was flushed with appropriate volume heparin dwells. The catheter exit site was secured with a 0-Silk retention suture. The venotomy incision was closed with Dermabond. Sterile dressings were applied. The patient tolerated the procedure well without immediate post procedural complication. IMPRESSION: Successful placement of 23 cm tip to cuff tunneled hemodialysis catheter via the right internal jugular vein with catheter tip terminating within the right atrium. The catheter is ready for immediate use. Ruthann Cancer, MD [facility] Radiology Electronically Signed   By: Ruthann Cancer MD   On: 10/19/2020 10:03

## 2020-10-19 NOTE — Progress Notes (Signed)
Transported to and from 3M07 to IR on vent

## 2020-10-19 NOTE — Progress Notes (Signed)
Patient appears to be in pain. Tears in her eyes, HR in the 120s. RR 37. This RN asked patient if she was hurting where they inserted her new dialysis catheter and patient was able to nod yes. PRN pain regimen given.

## 2020-10-19 NOTE — Progress Notes (Signed)
Pharmacy Consult - Vitamin D and Copper supplementation  51 yof currently intubated and sedated on CRRT with vitamin D and copper levels remaining low. Patient previously supplemented with copper 1g IV daily this admit x 5 days. Pharmacy consulted to supplement.  Copper level - 20 (low) Vitamin D - 18.37 (low)  Plan: Cholecalciferol 2000 units PO daily - may adjust dose if needed every 3-4 months based on repeat level Copper chloride 2mg  IV daily x 5 days - repeat level as appropriate   Arturo Morton, PharmD, BCPS Please check AMION for all [place] contact numbers Clinical Pharmacist 10/19/2020 1:43 PM

## 2020-10-19 NOTE — Progress Notes (Signed)
Patient ID: Candace [name], female   DOB: 05/31/1951, 69 y.o.   MRN: [id number] S: Seen while getting ready for her [facility] placement in IR.  No events overnight O:BP 92/64   Pulse (!) 118   Temp (!) 97.4 F (36.3 C) (Oral)   Resp (!) 40   Ht 5' 8"  (1.727 m)   Wt 78.5 kg   SpO2 99%   BMI 26.31 kg/m   Intake/Output Summary (Last 24 hours) at 10/19/2020 1158 Last data filed at 10/19/2020 1144 Gross per 24 hour  Intake 2027.01 ml  Output 2501 ml  Net -473.99 ml   Intake/Output: I/O last 3 completed shifts: In: 3314.3 [I.V.:759.3; NG/GT:2555] Out: 1003 [Other:3802; Stool:760]  Intake/Output this shift:  Total I/O In: 464.3 [I.V.:41.8; NG/GT:422.5] Out: 158 [Other:83; Stool:75] Weight change: -0.4 kg Gen: on vent via trach in NAD CVS: tachy Resp: occ rhonchi Abd: benign, ostomy in place Ext: 1+ anasarca  Recent Labs  Lab 10/16/20 0310 10/16/20 1717 10/17/20 0631 10/17/20 1739 10/18/20 0248 10/18/20 1605 10/19/20 0457  NA 129* 132* 133* 129* 133* 132* 132*  K 5.4* 4.0 4.3 4.8 5.3* 4.7 4.3  CL 97* 97* 98 96* 98 98 100  CO2 24 24 25 25 23 25 24   GLUCOSE 346* 308* 267* 277* 144* 264* 145*  BUN 51* 51* 49* 54* 50* 53* 38*  CREATININE 0.62 0.67 0.65 0.59 0.57 0.54 0.49  ALBUMIN 2.4* 2.5* 2.6* 2.5* 2.5* 2.4* 2.3*  CALCIUM 9.8 10.4* 10.2 10.2 10.2 10.3 10.0  PHOS 4.2 3.9 3.3 3.4 2.9 2.7 2.7   Liver Function Tests: Recent Labs  Lab 10/18/20 0248 10/18/20 1605 10/19/20 0457  ALBUMIN 2.5* 2.4* 2.3*   No results for input(s): LIPASE, AMYLASE in the last 168 hours. No results for input(s): AMMONIA in the last 168 hours. CBC: Recent Labs  Lab 10/15/20 0500 10/16/20 0310 10/17/20 0631 10/18/20 0248 10/19/20 0457  WBC 15.2* 16.5* 15.0* 17.9* 16.9*  HGB 7.3* 7.3* 7.3* 7.4* 7.9*  HCT 24.1* 24.6* 24.7* 25.2* 24.6*  MCV 94.9 96.1 97.6 98.4 93.5  PLT 206 249 294 260 261   Cardiac Enzymes: No results for input(s): CKTOTAL, CKMB, CKMBINDEX, TROPONINI in the last 168  hours. CBG: Recent Labs  Lab 10/18/20 1938 10/18/20 2328 10/19/20 0344 10/19/20 0743 10/19/20 1146  GLUCAP 181* 233* 164* 124* 144*    Iron Studies: No results for input(s): IRON, TIBC, TRANSFERRIN, FERRITIN in the last 72 hours. Studies/Results: IR Fluoro Guide CV Line Right  Result Date: 10/19/2020 INDICATION: 69 year old female with acute on chronic kidney disease requiring permanent hemodialysis access. EXAM: TUNNELED CENTRAL VENOUS HEMODIALYSIS CATHETER PLACEMENT WITH ULTRASOUND AND FLUOROSCOPIC GUIDANCE MEDICATIONS: Ancef 2 gm IV . The antibiotic was given in an appropriate time interval prior to skin puncture. ANESTHESIA/SEDATION: Moderate (conscious) sedation was employed during this procedure. A total of Versed 2 mg and Fentanyl 100 mcg was administered intravenously. Moderate Sedation Time: 12 minutes. The patient's level of consciousness and vital signs were monitored continuously by radiology nursing throughout the procedure under my direct supervision. FLUOROSCOPY TIME:  0 minutes 12 seconds (70 mGy). COMPLICATIONS: None immediate. PROCEDURE: Informed written consent was obtained from the patient after a discussion of the risks, benefits, and alternatives to treatment. Questions regarding the procedure were encouraged and answered. The right neck and chest were prepped with chlorhexidine in a sterile fashion, and a sterile drape was applied covering the operative field. Maximum barrier sterile technique with sterile gowns and gloves were used for the procedure. A  timeout was performed prior to the initiation of the procedure. After creating a small venotomy incision, a 21 gauge micropuncture kit was utilized to access the internal jugular vein. Real-time ultrasound guidance was utilized for vascular access including the acquisition of a permanent ultrasound image documenting patency of the accessed vessel. A Rosen wire was advanced to the level of the IVC and the micropuncture sheath  was exchanged for an 8 Fr dilator. A 14.5 French tunneled hemodialysis catheter measuring 23 cm from tip to cuff was tunneled in a retrograde fashion from the anterior chest wall to the venotomy incision. Serial dilation was then performed an a peel-away sheath was placed. The catheter was then placed through the peel-away sheath with the catheter tip ultimately positioned within the right atrium. Final catheter positioning was confirmed and documented with a spot radiographic image. The catheter aspirates and flushes normally. The catheter was flushed with appropriate volume heparin dwells. The catheter exit site was secured with a 0-Silk retention suture. The venotomy incision was closed with Dermabond. Sterile dressings were applied. The patient tolerated the procedure well without immediate post procedural complication. IMPRESSION: Successful placement of 23 cm tip to cuff tunneled hemodialysis catheter via the right internal jugular vein with catheter tip terminating within the right atrium. The catheter is ready for immediate use. Ruthann Cancer, MD [facility] Radiology Electronically Signed   By: Ruthann Cancer MD   On: 10/19/2020 10:03   . atropine      . B-complex with vitamin C  1 tablet Per Tube Daily  . chlorhexidine gluconate (MEDLINE KIT)  15 mL Mouth Rinse BID  . Chlorhexidine Gluconate Cloth  6 each Topical Daily  . clonazepam  1 mg Per Tube BID  . collagenase   Topical Daily  . darbepoetin (ARANESP) injection - DIALYSIS  60 mcg Subcutaneous Q Tue-1800  . docusate  100 mg Per Tube BID  . feeding supplement (PROSource TF)  90 mL Per Tube TID  . guaiFENesin  15 mL Per Tube Q6H  . hydrocortisone sod succinate (SOLU-CORTEF) inj  50 mg Intravenous Q12H  . insulin aspart  0-15 Units Subcutaneous Q4H  . insulin aspart  7 Units Subcutaneous Q4H  . insulin glargine  40 Units Subcutaneous Daily  . mouth rinse  15 mL Mouth Rinse QID  . midodrine   20 mg Per Tube TID WC  . nutrition supplement (JUVEN)  1 packet Per Tube BID BM  . oxyCODONE  5 mg Per Tube Q4H  . pantoprazole sodium  40 mg Per Tube BID  . polyethylene glycol  17 g Per Tube Daily  . QUEtiapine  100 mg Per Tube BID  . sodium chloride flush  10-40 mL Intracatheter Q12H    BMET    Component Value Date/Time   NA 132 (L) 10/19/2020 0457   K 4.3 10/19/2020 0457   CL 100 10/19/2020 0457   CO2 24 10/19/2020 0457   GLUCOSE 145 (H) 10/19/2020 0457   BUN 38 (H) 10/19/2020 0457   CREATININE 0.49 10/19/2020 0457   CALCIUM 10.0 10/19/2020 0457   GFRNONAA >60 10/19/2020 0457   CBC    Component Value Date/Time   WBC 16.9 (H) 10/19/2020 0457   RBC 2.63 (L) 10/19/2020 0457   HGB 7.9 (L) 10/19/2020 0457   HCT 24.6 (L) 10/19/2020 0457   PLT 261 10/19/2020 0457   MCV 93.5 10/19/2020 0457   MCH 30.0 10/19/2020 0457   MCHC 32.1 10/19/2020 0457  RDW 16.1 (H) 10/19/2020 0457   LYMPHSABS 0.4 (L) 10/04/2020 0516   MONOABS 0.2 10/04/2020 0516   EOSABS 0.0 10/04/2020 0516   BASOSABS 0.0 10/04/2020 0516    Assessment/Plan:  1. AKI- oliguric presumably due to ischemic ATN in setting of hemorrhagic shock. Started on CRRT 09/08/20 due to persistent oliguria and hyperkalemia. 1. All fluids 4K/2.5Ca: Pre-filter 400 ml/hr, post-filter 200 ml/hr, dialysate 1,000 ml/hr 2. Due to hyperkalemia will increase pre-filter to 500 ml/hr, post-filter 300 ml/hr, and dialysate to 1500 ml/hr.  If still elevated later today will change dialysate to 2K/2.5C 3. No anticoagulation due to thrombocytopenia and ongoing GI blood loss. 4. Continue with current CRRT settings.  Pt has deteriorated several times after trying to transition to IHD and will need to discuss this with family before attempting it again. 5. RSCV HD catheter placed 09/29/20 and removed today 6. RIJ TDC placed 10/19/20 by IR 2. Hemorrhagic shock- ongoing blood loss and requiring pressors. 1. Transfuse as needed 3. Septic shock  due to MRSA pneumonia. 4. Perforated duodenal ulcer- s/p exploratory lap and Graham patch placement per surgery. 5. Acute hypoxic/hypercapnic respiratory failure due to ARDS from Recent Covid-19 PNA- currently on vent via trach per PCCM.  6. Thrombocytopenia- follow 7. P. Atrial fibrillation- no anticoagulation due to GI bleed. 8. Vascular access- RIJ trialysis catheter 09/08/20 then removed. Left subclavian trialysis catheter placed 09/15/20 andremoved and new right Nora HD catheter placed on 09/29/20.  Will need new HD catheter and discussed with PCCM and will consult IR for [facility] placement given lack of renal recovery.  9. Severe protein malnutrition- per primary svc. 10. Disposition- poor overall prognosis and will likely require LTC and chronic vent support. PCCM to have ongoing goals of care meetings with family.  Donetta Potts, MD [employer] [phone]

## 2020-10-19 NOTE — Progress Notes (Signed)
Physical Therapy Treatment Patient Details Name: Candace [name] MRN: [id number] DOB: 12-04-1950 Today's Date: 10/19/2020    History of Present Illness 69 y.o. female with medical history significant for recent covid pna, obesity, colon cancer s/p colon resection with colostomy bag, HLD, NIDDM2, CKD3 presented to ED after her follow-up nurse advised her to present to ED for elevated HR. +afib, elevated troponins with demand ischemia,  CT scan is positive for bowel perforation with pneumomediastinum 11/03 exp lap with repair of perforated ulcer. 11/10 t/f to ICU- intubated/sedated/proned. Trach placed 11/24. CRRT off 12/8, restarted 12/10.    PT Comments    Pt awake with minimal responses today with head nods, mouth opening, sticking out tongue, wiggling toes and squeezing hands. Pt with trace activation of bil wrists and ankles no noted activation in large muscle groups bil UE or LE but pt with grimace with right knee flexion and right elbow flexion. Pt with very delayed and limited command following but significantly improved from last session with decreased sedation. Pt will require extensive exercise and conditioning to regain muscle strength for functional use. Family not present at this time for education on PROm and AArOM to perform daily. Will follow and increase frequency now that command following and cognition demonstrating improvement.   30% PRVC, SpO2 100% HR 103    Follow Up Recommendations  LTACH;Supervision/Assistance - 24 hour     Equipment Recommendations  Wheelchair (measurements PT);Wheelchair cushion (measurements PT);Hospital bed;Other (comment)    Recommendations for Other Services       Precautions / Restrictions Precautions Precautions: Fall Precaution Comments: baseline colostomy; trach-vent; CRRT; A line, sacral wound    Mobility  Bed Mobility Overal bed mobility: Needs Assistance Bed Mobility: Rolling Rolling: Total assist   Supine to sit: Total assist      General bed mobility comments: total assist to roll and total assist with bed features to acheive sitting. total assist for anterior translation in sitting to clear trunk from surface with max cues and no noted trunk activation- good head control unsupported  Transfers                 General transfer comment: did not attempt  Ambulation/Gait                 Stairs             Wheelchair Mobility    Modified Rankin (Stroke Patients Only)       Balance Overall balance assessment: Needs assistance   Sitting balance-Leahy Scale: Zero                                      Cognition Arousal/Alertness: Awake/alert Behavior During Therapy: Flat affect Overall Cognitive Status: Difficult to assess                     Current Attention Level: Focused   Following Commands: Follows one step commands inconsistently;Follows one step commands with increased time       General Comments: pt with eyes open following commands grossly 10% of the time to respond to head nods, closing eyes, opening mouth, sticking out tongue and wiggling bil toes and squeezing hands R>L      Exercises General Exercises - Upper Extremity Shoulder Flexion: PROM;Both;Supine;10 reps Shoulder ABduction: PROM;10 reps;Supine;Both Elbow Flexion: Both;Supine;PROM;10 reps Elbow Extension: Both;Supine;PROM;10 reps Wrist Flexion: PROM;Both;Supine;10 reps Wrist Extension: PROM;Both;10 reps;Supine General Exercises -  Lower Extremity Ankle Circles/Pumps: PROM;Both;10 reps;Supine Heel Slides: PROM;Both;10 reps;Supine Hip ABduction/ADduction: PROM;10 reps;Both;Supine    General Comments        Pertinent Vitals/Pain Pain Assessment: No/denies pain    Home Living                      Prior Function            PT Goals (current goals can now be found in the care plan section) Acute Rehab PT Goals Time For Goal Achievement: 11/02/20 Potential to  Achieve Goals: Fair Progress towards PT goals: Progressing toward goals    Frequency    Min 2X/week      PT Plan Frequency needs to be updated;Current plan remains appropriate    Co-evaluation              AM-PAC PT "6 Clicks" Mobility   Outcome Measure  Help needed turning from your back to your side while in a flat bed without using bedrails?: Total Help needed moving from lying on your back to sitting on the side of a flat bed without using bedrails?: Total Help needed moving to and from a bed to a chair (including a wheelchair)?: Total Help needed standing up from a chair using your arms (e.g., wheelchair or bedside chair)?: Total Help needed to walk in hospital room?: Total Help needed climbing 3-5 steps with a railing? : Total 6 Click Score: 6    End of Session   Activity Tolerance: Patient limited by lethargy Patient left: in bed;with call bell/phone within reach;with nursing/sitter in room Nurse Communication: Mobility status PT Visit Diagnosis: Muscle weakness (generalized) (M62.81);Difficulty in walking, not elsewhere classified (R26.2);Pain;Adult, failure to thrive (R62.7);Unsteadiness on feet (R26.81)     Time: 0768-0881 PT Time Calculation (min) (ACUTE ONLY): 32 min  Charges:  $Therapeutic Exercise: 23-37 mins                     Vickie Ponds P, PT Acute Rehabilitation Services Pager: [phone] Office: [phone]    Chiara Coltrin B Sahith Nurse 10/19/2020, 8:16 AM

## 2020-10-19 NOTE — Progress Notes (Signed)
Nutrition Follow-up  DOCUMENTATION CODES:   Obesity unspecified  INTERVENTION:   Tube Feedingvia Cortrak: Vital 1.5 at 65 ml/hr Pro-source TF 90 mL TID Provides 171 g of protein, 2580 kcals and 1094 mL of free water  Juven BID, each packet provides 80 calories, 8 grams of carbohydrate, 2.5  grams of protein (collagen), 7 grams of L-arginine and 7 grams of L-glutamine; supplement contains CaHMB, Vitamins C, E, B12 and Zinc to promote wound healing  Continue B-complex with C  Supplement for Vit D deficiency and Copper deficiency   NUTRITION DIAGNOSIS:   Inadequate oral intake related to inability to eat as evidenced by NPO status.  Being addressed via TF   GOAL:   Patient will meet greater than or equal to 90% of their needs  Progressing  MONITOR:   Vent status,TF tolerance,Labs,Weight trends  REASON FOR ASSESSMENT:   Consult New TPN/TNA  ASSESSMENT:   69 y.o. female was recently hospitalized for COVID-19 pneumonia-and discharged on 2-3 L of home O2-presents with worsening shortness of breath-found to have A. fib with RVR, worsening hypoxia. Pt developed severe upper abdominal pain-subsequent further imaging studies revealed perforated viscus. S/p laparotomy on 11/3 which showed a perforated duodenal ulcer.  11/03Exploratory laparotomy, adhesiolysis, modified graham patch repair of perforated duodenal ulcer 11/05 TPN initiated 11/08 UGI negative for leak 11/09 Acute decline in respiratory and mental status requiring intubation 11/10 Started on trickle TF, CT abdomen negative for postop complication 93/57 TPN discontinued due to elevated LFTS, TGs; TF increased to 30 ml/hr, Started CRRT 11/16 Extubated, Re-intubated 11/23 Trach placed, CRRT d/c 11/24 CRRT restarted due to pressors, hyperkalemia 11/26 Hemorrhagic shock, Hgb 5.6 12/04 CRRT stopped 12/05 CRRT restarted after failed iHD 12/08 CRRT held 12/10 CRRT restarted 12/21 TDC placement in IR  Pt remains  on vent support via trach, remains CRRT. Off levophed. Pt has previously been unable to tolerate iHD-noted plan to reattempt  Ultimatelyneeds permanent feeding accessPEG for continued ongoing aggressive care, however we are unableto place G-tubecurrently because gastric insufflation would put her duodenal patch at risk.  Tolerating Vital 1.5 at 65 ml/hr with Pro-Source TF and Juven via [place] with hydrotherapy for sacral wound  Repeat Copper post repletion remains low  Vitamin Labs:  Copper: 10 (L-12/01), repeat 20 (L-12/16) Vitamin C: 1.1 (wdl-12/01) Zinc: 148 (H-12/16 Vitamin D 25-hydroxy: 18.37 (L-12/16) Folate: 12.1 (wdl-12/16) Selenium: 111 (wdl-12/16) Vitamin A: pending (12/16)  Labs: sodium 132 (L) Meds: B-complex with C, aranesp, ss novolog, novolog q 4 hours, lantus  Diet Order:   Diet Order            Diet NPO time specified  Diet effective midnight                 EDUCATION NEEDS:   Not appropriate for education at this time  Skin:  Skin Assessment: Skin Integrity Issues: Skin Integrity Issues:: Stage II DTI: sacrum Stage I: n/a Stage II: ear Incisions: abdomen  Last BM:  12/21 ileostomy  Height:   Ht Readings from Last 1 Encounters:  09/20/20 5\' 8"  (1.727 m)    Weight:   Wt Readings from Last 1 Encounters:  10/19/20 78.5 kg    BMI:  Body mass index is 26.31 kg/m.  Estimated Nutritional Needs:   Kcal:  0177-9390 kcals  Protein:  135-175 g  Fluid:  >/= 2 L/day   Kerman Passey MS, RDN, LDN, CNSC Registered Dietitian III Clinical Nutrition RD Pager and On-Call Pager Number Located in [place]

## 2020-10-19 NOTE — Sedation Documentation (Signed)
Patient transported to 30M with RRT. Bedside report given to Autumn RN.

## 2020-10-19 NOTE — Progress Notes (Addendum)
[place] for Heparin Indication: atrial fibrillation  Assessment: 69 y.o. female with a history of atrial fibrillation on chronic Eliquis prior to admission which has been held and patient transitioned to IV heparin infusion per pharmacy dosing consult.   Heparin level therapeutic at 0.39 on 1200 units/hr.  Hemoglobin low but stable. Platelets are within normal limits.  Remains on CRRT No bleeding or issues with infusion per discussion with RN. Plan is for conversion of temp to tunneled vs. new placement of catheter in IR today.  Goal of Therapy:  Heparin level 0.3-0.5 units/ml Monitor platelets by anticoagulation protocol: Yes   Plan:  Continue heparin at 1200 units/hr Monitor daily heparin level and CBC, s/sx bleeding F/u resumption of heparin post-procedure   Arturo Morton, PharmD, BCPS Please check AMION for all [place] contact numbers Clinical Pharmacist 10/19/2020 8:47 AM

## 2020-10-19 NOTE — Progress Notes (Signed)
Physical Therapy Wound Treatment Patient Details  Name: Candace [name] MRN: [id number] Date of Birth: October 07, 1951  Today's Date: 10/19/2020 Time: 3419-6222 Time Calculation (min): 35 min  Subjective  Subjective: Lethargic on vent with intermittent eyes open. Patient and Family Stated Goals: Heal wound; maintain pt comfort per daughter Date of Onset:  (Unknown)  Pain Score:  0/10  Wound Assessment  Pressure Injury 09/25/20 Sacrum Bilateral;Medial Deep Tissue Pressure Injury - Purple or maroon localized area of discolored intact skin or blood-filled blister due to damage of underlying soft tissue from pressure and/or shear. Purple, non-blanchable, b (Active)  Dressing Type ABD;Barrier Film (skin prep);Gauze (Comment) 10/19/20 1634  Dressing Changed;Clean;Dry;Intact 10/19/20 1634  Dressing Change Frequency Daily 10/19/20 1634  State of Healing Eschar 10/19/20 1634  Site / Wound Assessment Black;Red;Purple;Yellow 10/19/20 1634  % Wound base Red or Granulating 20% 10/18/20 1011  % Wound base Yellow/Fibrinous Exudate 0% 10/18/20 1011  % Wound base Black/Eschar 80% 10/18/20 1011  % Wound base Other/Granulation Tissue (Comment) 0% 10/18/20 1011  Peri-wound Assessment Excoriated 10/19/20 1634  Wound Length (cm) 13 cm 10/13/20 1200  Wound Width (cm) 12.5 cm 10/13/20 1200  Wound Depth (cm) 0.1 cm 10/13/20 1200  Wound Surface Area (cm^2) 162.5 cm^2 10/13/20 1200  Wound Volume (cm^3) 16.25 cm^3 10/13/20 1200  Tunneling (cm) 0 10/13/20 1200  Undermining (cm) 0 10/13/20 1200  Margins Unattached edges (unapproximated) 10/19/20 1634  Drainage Amount Minimal 10/19/20 1634  Drainage Description Serosanguineous 10/19/20 1634  Treatment Debridement (Selective);Hydrotherapy (Pulse lavage);Packing (Saline gauze) 10/19/20 1634   Santyl applied to wound bed prior to applying dressing.   Hydrotherapy Pulsed lavage therapy - wound location: sacrum Pulsed Lavage with Suction (psi): 8 psi (8-12) Pulsed  Lavage with Suction - Normal Saline Used: 1000 mL Pulsed Lavage Tip: Tip with splash shield Selective Debridement Selective Debridement - Location: sacrum Selective Debridement - Tools Used: Forceps;Scalpel Selective Debridement - Tissue Removed: eschar; non-viable tissue   Wound Assessment and Plan  Wound Therapy - Assess/Plan/Recommendations Wound Therapy - Clinical Statement: Most eschar has been removed, leaving nectroic adipose. Wound is poorly vascularized. This patient will benefit from continued hydrotherapy for selective removal of unviable tissue, to decrease bioburden and promote wound bed healing. Wound Therapy - Functional Problem List: Global weakness and immobility Factors Delaying/Impairing Wound Healing: Diabetes Mellitus;Immobility;Multiple medical problems Hydrotherapy Plan: Debridement;Dressing change;Patient/family education;Pulsatile lavage with suction Wound Therapy - Frequency: 6X / week Wound Therapy - Follow Up Recommendations: Skilled nursing facility Wound Plan: See above  Wound Therapy Goals- Improve the function of patient's integumentary system by progressing the wound(s) through the phases of wound healing (inflammation - proliferation - remodeling) by: Decrease Necrotic Tissue to: 20% Decrease Necrotic Tissue - Progress: Progressing toward goal Increase Granulation Tissue to: 80% Increase Granulation Tissue - Progress: Progressing toward goal Goals/treatment plan/discharge plan were made with and agreed upon by patient/family: No, Patient unable to participate in goals/treatment/discharge plan and family unavailable Time For Goal Achievement: 7 days Wound Therapy - Potential for Goals: Good  Goals will be updated until maximal potential achieved or discharge criteria met.  Discharge criteria: when goals achieved, discharge from hospital, MD decision/surgical intervention, no progress towards goals, refusal/missing three consecutive treatments without  notification or medical reason.  GP  [name], PT, DPT Acute Rehabilitation Services Pager [phone] Office [phone]      [name] 10/19/2020, 4:39 PM

## 2020-10-19 NOTE — Progress Notes (Signed)
.  called to bedside . Daughter Marlowe Kays there. Patient tachy sinus 120s (more than baseline). RR high 30s. Denies pain. On CRRT. Pulse ox 100% and sync on vent prvc 40%. No fever per RN and no other change  Per daughter Sharyn Lull who was on speaker - happens around this time and settles when scheduled oxy is given around 8pm and they thnk  Plan  - cxr  -lactic acid - cbc - lft - give dialudid 1mg  and reasess    SIGNATURE    Dr. Brand Males, M.D., F.C.C.P,  Pulmonary and Critical Care Medicine Staff Physician, [place] Director - Interstitial Lung Disease  Program  Pulmonary [place] [facility] [place], [place], [address]  Pager: [phone], If no answer  OR between  19:00-7:00h: page [phone] Telephone (clinical office): [phone] Telephone (research): [phone]  6:40 PM 10/19/2020

## 2020-10-19 NOTE — Sedation Documentation (Signed)
Patient from 36m on vent. Respiratory in control room. Will transport patient back to ICU once tunneled catheter is placed

## 2020-10-19 NOTE — Procedures (Signed)
Interventional Radiology Procedure Note  Procedure: Tunneled hemodialysis catheter placement  Findings: Please refer to procedural dictation for full description. 14.5 Fr, 23 cm right IJ tunneled hemodialysis catheter placement.  Removal of indwelling subclavian catheter.  Complications: None immediate  Estimated Blood Loss: <5 mL  Recommendations: Catheter ready for immediate use.   Ruthann Cancer, MD

## 2020-10-20 ENCOUNTER — Inpatient Hospital Stay (HOSPITAL_COMMUNITY): Payer: Medicare Other

## 2020-10-20 DIAGNOSIS — J80 Acute respiratory distress syndrome: Secondary | ICD-10-CM | POA: Diagnosis not present

## 2020-10-20 DIAGNOSIS — N179 Acute kidney failure, unspecified: Secondary | ICD-10-CM | POA: Diagnosis not present

## 2020-10-20 DIAGNOSIS — I4891 Unspecified atrial fibrillation: Secondary | ICD-10-CM | POA: Diagnosis not present

## 2020-10-20 LAB — RENAL FUNCTION PANEL
Albumin: 2.3 g/dL — ABNORMAL LOW (ref 3.5–5.0)
Albumin: 2.4 g/dL — ABNORMAL LOW (ref 3.5–5.0)
Anion gap: 9 (ref 5–15)
Anion gap: 8 (ref 5–15)
BUN: 37 mg/dL — ABNORMAL HIGH (ref 8–23)
BUN: 40 mg/dL — ABNORMAL HIGH (ref 8–23)
CO2: 25 mmol/L (ref 22–32)
CO2: 26 mmol/L (ref 22–32)
Calcium: 10.1 mg/dL (ref 8.9–10.3)
Calcium: 10.3 mg/dL (ref 8.9–10.3)
Chloride: 98 mmol/L (ref 98–111)
Chloride: 99 mmol/L (ref 98–111)
Creatinine, Ser: 0.44 mg/dL (ref 0.44–1.00)
Creatinine, Ser: 0.48 mg/dL (ref 0.44–1.00)
GFR, Estimated: >60 mL/min (ref >60)
GFR, Estimated: >60 mL/min (ref >60)
Glucose, Bld: 161 mg/dL — ABNORMAL HIGH (ref 70–99)
Glucose, Bld: 169 mg/dL — ABNORMAL HIGH (ref 70–99)
Phosphorus: 2.8 mg/dL (ref 2.5–4.6)
Phosphorus: 2.8 mg/dL (ref 2.5–4.6)
Potassium: 4.4 mmol/L (ref 3.5–5.1)
Potassium: 4.6 mmol/L (ref 3.5–5.1)
Sodium: 132 mmol/L — ABNORMAL LOW (ref 135–145)
Sodium: 133 mmol/L — ABNORMAL LOW (ref 135–145)

## 2020-10-20 LAB — CBC
HCT: 26.4 % — ABNORMAL LOW (ref 36.0–46.0)
Hemoglobin: 7.7 g/dL — ABNORMAL LOW (ref 12.0–15.0)
MCH: 28.2 pg (ref 26.0–34.0)
MCHC: 29.2 g/dL — ABNORMAL LOW (ref 30.0–36.0)
MCV: 96.7 fL (ref 80.0–100.0)
Platelets: 294 10*3/uL (ref 150–400)
RBC: 2.73 MIL/uL — ABNORMAL LOW (ref 3.87–5.11)
RDW: 16.4 % — ABNORMAL HIGH (ref 11.5–15.5)
WBC: 15.4 10*3/uL — ABNORMAL HIGH (ref 4.0–10.5)
nRBC: 2.7 % — ABNORMAL HIGH (ref 0.0–0.2)

## 2020-10-20 LAB — GLUCOSE, CAPILLARY
Glucose-Capillary: 123 mg/dL — ABNORMAL HIGH (ref 70–99)
Glucose-Capillary: 153 mg/dL — ABNORMAL HIGH (ref 70–99)
Glucose-Capillary: 167 mg/dL — ABNORMAL HIGH (ref 70–99)
Glucose-Capillary: 194 mg/dL — ABNORMAL HIGH (ref 70–99)
Glucose-Capillary: 212 mg/dL — ABNORMAL HIGH (ref 70–99)
Glucose-Capillary: 226 mg/dL — ABNORMAL HIGH (ref 70–99)

## 2020-10-20 LAB — HEPARIN LEVEL (UNFRACTIONATED): Heparin Unfractionated: 0.36 IU/mL (ref 0.30–0.70)

## 2020-10-20 LAB — PATHOLOGIST SMEAR REVIEW

## 2020-10-20 LAB — MAGNESIUM: Magnesium: 2.8 mg/dL — ABNORMAL HIGH (ref 1.7–2.4)

## 2020-10-20 NOTE — Progress Notes (Signed)
Patient ID: Candace [name], female   DOB: [birth year], 69 y.o.   MRN: [id number] S: not feeling well today and complaining of pain.  Pt was seen and examined while on CRRT. O:BP 133/75   Pulse (!) 119   Temp (!) 97.1 F (36.2 C) (Axillary)   Resp (!) 38   Ht _0  (1.727 m)   Wt 79.5 kg   SpO2 98%   BMI 26.65 kg/m   Intake/Output Summary (Last 24 hours) at 10/20/2020 1328 Last data filed at 10/20/2020 1212 Gross per 24 hour  Intake 3016.3 ml  Output 3230 ml  Net -213.7 ml   Intake/Output: I/O last 3 completed shifts: In: 3366.1 [I.V.:693.6; NG/GT:2572.5; IV Piggyback:100.1] Out: 2641 [Other:3195; Stool:675]  Intake/Output this shift:  Total I/O In: 892.3 [I.V.:97.3; NG/GT:695; IV Piggyback:100.1] Out: 701 [Other:651; Stool:50] Weight change: 1 kg Gen: on vent via trach, flat affect CVS: tachy at 119 Resp: scattered rhonchi Abd: +BS, soft, ostomy in RLQ Ext: trace dependent edema  Recent Labs  Lab 10/17/20 0631 10/17/20 1739 10/18/20 0248 10/18/20 1605 10/19/20 0457 10/19/20 1551 10/19/20 1836 10/20/20 0350  NA 133* 129* 133* 132* 132* 131*  --  132*  K 4.3 4.8 5.3* 4.7 4.3 4.3  --  4.4  CL 98 96* 98 98 100 97*  --  98  CO2 _1 --  25  GLUCOSE 267* 277* 144* 264* 145* 212*  --  161*  BUN 49* 54* 50* 53* 38* 45*  --  37*  CREATININE 0.65 0.59 0.57 0.54 0.49 0.45  --  0.44  ALBUMIN 2.6* 2.5* 2.5* 2.4* 2.3* 2.4* 2.4* 2.3*  CALCIUM 10.2 10.2 10.2 10.3 10.0 10.1  --  10.1  PHOS 3.3 3.4 2.9 2.7 2.7 3.1  --  2.8  AST  --   --   --   --   --   --  74*  --   ALT  --   --   --   --   --   --  85*  --    Liver Function Tests: Recent Labs  Lab 10/19/20 1551 10/19/20 1836 10/20/20 0350  AST  --  74*  --   ALT  --  85*  --   ALKPHOS  --  332*  --   BILITOT  --  0.6  --   PROT  --  7.4  --   ALBUMIN 2.4* 2.4* 2.3*   No results for input(s): LIPASE, AMYLASE in the last 168 hours. No results for input(s): AMMONIA in the last 168 hours. CBC: Recent  Labs  Lab 10/17/20 0631 10/18/20 0248 10/19/20 0457 10/19/20 1836 10/20/20 0350  WBC 15.0* 17.9* 16.9* 16.1* 15.4*  HGB 7.3* 7.4* 7.9* 8.0* 7.7*  HCT 24.7* 25.2* 24.6* 26.7* 26.4*  MCV 97.6 98.4 93.5 96.4 96.7  PLT 294 260 261 288 294   Cardiac Enzymes: No results for input(s): CKTOTAL, CKMB, CKMBINDEX, TROPONINI in the last 168 hours. CBG: Recent Labs  Lab 10/19/20 1942 10/19/20 2345 10/20/20 0345 10/20/20 0802 10/20/20 1140  GLUCAP 196* 190* 167* 123* 226*    Iron Studies: No results for input(s): IRON, TIBC, TRANSFERRIN, FERRITIN in the last 72 hours. Studies/Results: IR Fluoro Guide CV Line Right  Result Date: 10/19/2020 INDICATION: 69 year old female with acute on chronic kidney disease requiring permanent hemodialysis access. EXAM: TUNNELED CENTRAL VENOUS HEMODIALYSIS CATHETER PLACEMENT WITH ULTRASOUND AND FLUOROSCOPIC GUIDANCE MEDICATIONS: Ancef 2 gm IV . The antibiotic was given in  an appropriate time interval prior to skin puncture. ANESTHESIA/SEDATION: Moderate (conscious) sedation was employed during this procedure. A total of Versed 2 mg and Fentanyl 100 mcg was administered intravenously. Moderate Sedation Time: 12 minutes. The patient's level of consciousness and vital signs were monitored continuously by radiology nursing throughout the procedure under my direct supervision. FLUOROSCOPY TIME:  0 minutes 12 seconds (70 mGy). COMPLICATIONS: None immediate. PROCEDURE: Informed written consent was obtained from the patient after a discussion of the risks, benefits, and alternatives to treatment. Questions regarding the procedure were encouraged and answered. The right neck and chest were prepped with chlorhexidine in a sterile fashion, and a sterile drape was applied covering the operative field. Maximum barrier sterile technique with sterile gowns and gloves were used for the procedure. A timeout was performed prior to the initiation of the procedure. After creating a small  venotomy incision, a 21 gauge micropuncture kit was utilized to access the internal jugular vein. Real-time ultrasound guidance was utilized for vascular access including the acquisition of a permanent ultrasound image documenting patency of the accessed vessel. A Rosen wire was advanced to the level of the IVC and the micropuncture sheath was exchanged for an 8 Fr dilator. A 14.5 French tunneled hemodialysis catheter measuring 23 cm from tip to cuff was tunneled in a retrograde fashion from the anterior chest wall to the venotomy incision. Serial dilation was then performed an a peel-away sheath was placed. The catheter was then placed through the peel-away sheath with the catheter tip ultimately positioned within the right atrium. Final catheter positioning was confirmed and documented with a spot radiographic image. The catheter aspirates and flushes normally. The catheter was flushed with appropriate volume heparin dwells. The catheter exit site was secured with a 0-Silk retention suture. The venotomy incision was closed with Dermabond. Sterile dressings were applied. The patient tolerated the procedure well without immediate post procedural complication. IMPRESSION: Successful placement of 23 cm tip to cuff tunneled hemodialysis catheter via the right internal jugular vein with catheter tip terminating within the right atrium. The catheter is ready for immediate use. Ruthann Cancer, MD [facility] Electronically Signed   By: Ruthann Cancer MD   On: 10/19/2020 10:03   IR US Guide Vasc Access Right  Result Date: 10/19/2020 INDICATION: 69 year old female with acute on chronic kidney disease requiring permanent hemodialysis access. EXAM: TUNNELED CENTRAL VENOUS HEMODIALYSIS CATHETER PLACEMENT WITH ULTRASOUND AND FLUOROSCOPIC GUIDANCE MEDICATIONS: Ancef 2 gm IV . The antibiotic was given in an appropriate time interval prior to skin puncture.  ANESTHESIA/SEDATION: Moderate (conscious) sedation was employed during this procedure. A total of Versed 2 mg and Fentanyl 100 mcg was administered intravenously. Moderate Sedation Time: 12 minutes. The patient's level of consciousness and vital signs were monitored continuously by radiology nursing throughout the procedure under my direct supervision. FLUOROSCOPY TIME:  0 minutes 12 seconds (70 mGy). COMPLICATIONS: None immediate. PROCEDURE: Informed written consent was obtained from the patient after a discussion of the risks, benefits, and alternatives to treatment. Questions regarding the procedure were encouraged and answered. The right neck and chest were prepped with chlorhexidine in a sterile fashion, and a sterile drape was applied covering the operative field. Maximum barrier sterile technique with sterile gowns and gloves were used for the procedure. A timeout was performed prior to the initiation of the procedure. After creating a small venotomy incision, a 21 gauge micropuncture kit was utilized to access the internal jugular vein. Real-time ultrasound guidance was utilized  for vascular access including the acquisition of a permanent ultrasound image documenting patency of the accessed vessel. A Rosen wire was advanced to the level of the IVC and the micropuncture sheath was exchanged for an 8 Fr dilator. A 14.5 French tunneled hemodialysis catheter measuring 23 cm from tip to cuff was tunneled in a retrograde fashion from the anterior chest wall to the venotomy incision. Serial dilation was then performed an a peel-away sheath was placed. The catheter was then placed through the peel-away sheath with the catheter tip ultimately positioned within the right atrium. Final catheter positioning was confirmed and documented with a spot radiographic image. The catheter aspirates and flushes normally. The catheter was flushed with appropriate volume heparin dwells. The catheter exit site was secured with a  0-Silk retention suture. The venotomy incision was closed with Dermabond. Sterile dressings were applied. The patient tolerated the procedure well without immediate post procedural complication. IMPRESSION: Successful placement of 23 cm tip to cuff tunneled hemodialysis catheter via the right internal jugular vein with catheter tip terminating within the right atrium. The catheter is ready for immediate use. Ruthann Cancer, MD [facility] Electronically Signed   By: Ruthann Cancer MD   On: 10/19/2020 10:03   DG CHEST PORT 1 VIEW  Result Date: 10/20/2020 CLINICAL DATA:  Shortness of breath.  COVID-19 positive EXAM: PORTABLE CHEST 1 VIEW COMPARISON:  October 19, 2020 FINDINGS: Tracheostomy catheter tip is 5.6 cm above the carina. Enteric tube tip is below the diaphragm. Central catheter tip is in the superior vena cava. No pneumothorax. Airspace opacity in both mid and lower lower lung regions, somewhat more on the left than on the right, remains stable. Small right pleural effusion. No new opacity. Heart size is within normal limits. Pulmonary vascularity is normal. No adenopathy. No bone lesions. IMPRESSION: Airspace opacity consistent with multifocal pneumonia persists without appreciable change. Stable small right pleural effusion. Stable cardiac silhouette. Tube and catheter positions as described without pneumothorax. Electronically Signed   By: Lowella Grip III M.D.   On: 10/20/2020 07:54   DG CHEST PORT 1 VIEW  Result Date: 10/19/2020 CLINICAL DATA:  Acute on chronic respiratory failure EXAM: PORTABLE CHEST 1 VIEW COMPARISON:  10/17/2020 at 5:01 a.m. FINDINGS: Single frontal view of the chest demonstrates stable tracheostomy tube, enteric catheter, and right-sided PICC. The right subclavian catheter seen previously has been removed, with a new right internal jugular catheter tip overlying superior vena cava. Cardiac silhouette is stable.  Stable bibasilar consolidation and likely small effusions. No pneumothorax. IMPRESSION: 1. Stable bibasilar airspace disease and effusions. 2. Support devices as above. Electronically Signed   By: Randa Ngo M.D.   On: 10/19/2020 18:45   . B-complex with vitamin C  1 tablet Per Tube Daily  . chlorhexidine gluconate (MEDLINE KIT)  15 mL Mouth Rinse BID  . Chlorhexidine Gluconate Cloth  6 each Topical Daily  . cholecalciferol  2,000 Units Per Tube Daily  . clonazepam  1 mg Per Tube BID  . collagenase   Topical Daily  . darbepoetin (ARANESP) injection - DIALYSIS  60 mcg Subcutaneous Q Tue-1800  . docusate  100 mg Per Tube BID  . feeding supplement (PROSource TF)  90 mL Per Tube TID  . guaiFENesin  15 mL Per Tube Q6H  . hydrocortisone sod succinate (SOLU-CORTEF) inj  50 mg Intravenous Q12H  . insulin aspart  0-15 Units Subcutaneous Q4H  . insulin aspart  7 Units Subcutaneous Q4H  .  insulin glargine  40 Units Subcutaneous Daily  . mouth rinse  15 mL Mouth Rinse QID  . midodrine  20 mg Per Tube TID WC  . nutrition supplement (JUVEN)  1 packet Per Tube BID BM  . oxyCODONE  5 mg Per Tube Q4H  . pantoprazole sodium  40 mg Per Tube BID  . polyethylene glycol  17 g Per Tube Daily  . QUEtiapine  100 mg Per Tube BID  . sodium chloride flush  10-40 mL Intracatheter Q12H    BMET    Component Value Date/Time   NA 132 (L) 10/20/2020 0350   K 4.4 10/20/2020 0350   CL 98 10/20/2020 0350   CO2 25 10/20/2020 0350   GLUCOSE 161 (H) 10/20/2020 0350   BUN 37 (H) 10/20/2020 0350   CREATININE 0.44 10/20/2020 0350   CALCIUM 10.1 10/20/2020 0350   GFRNONAA >60 10/20/2020 0350   CBC    Component Value Date/Time   WBC 15.4 (H) 10/20/2020 0350   RBC 2.73 (L) 10/20/2020 0350   HGB 7.7 (L) 10/20/2020 0350   HCT 26.4 (L) 10/20/2020 0350   PLT 294 10/20/2020 0350   MCV 96.7 10/20/2020 0350   MCH 28.2 10/20/2020 0350   MCHC 29.2 (L) 10/20/2020 0350   RDW 16.4 (H) 10/20/2020 0350   LYMPHSABS 0.4  (L) 10/04/2020 0516   MONOABS 0.2 10/04/2020 0516   EOSABS 0.0 10/04/2020 0516   BASOSABS 0.0 10/04/2020 0516     Assessment/Plan:  1. AKI- oliguric presumably due to ischemic ATN in setting of hemorrhagic shock. Started on CRRT 09/08/20 due to persistent oliguria and hyperkalemia. 1. All fluids 4K/2.5Ca: Pre-filter to 500 ml/hr, post-filter 300 ml/hr, and dialysate to 1500 ml/hr.  2. Will increase UF to 50 ml/hr given rhonchi, edema, and difficulty with weaning. 3. No anticoagulation due to thrombocytopenia and ongoing GI blood loss. 4. Continue with current CRRT settings. Pt has deteriorated several times after trying to transition to IHD and will need to discuss this with family before attempting it again. 5. RSCV HD catheter placed 09/29/20 and removed 12/21 6. RIJ TDC placed 10/19/20 by IR 2. Hemorrhagic shock- ongoing blood loss and requiring pressors. 1. Transfuse as needed 3. Septic shock due to MRSA pneumonia. 4. Perforated duodenal ulcer- s/p exploratory lap and Graham patch placement per surgery. 5. Acute hypoxic/hypercapnic respiratory failure due to ARDS from Recent Covid-19 PNA- currently on vent via trach per PCCM.  6. Thrombocytopenia- follow 7. P. Atrial fibrillation- no anticoagulation due to GI bleed. 8. Vascular access- RIJ trialysis catheter 09/08/20 then removed. Left subclavian trialysis catheter placed 09/15/20 andremoved and new right [place] HD catheter placed on 09/29/20. Will need new HD catheter and discussed with PCCM and will consult IR for [facility] placement given lack of renal recovery.  9. Severe protein malnutrition- per primary svc. 10. Disposition- poor overall prognosis and will likely require LTC and chronic vent support. PCCM to have ongoing goals of care meetings with family.   Donetta Potts, MD [employer] [phone]

## 2020-10-20 NOTE — Progress Notes (Signed)
[place] for Heparin Indication: atrial fibrillation  Assessment: 69 y.o. female with a history of atrial fibrillation on chronic Eliquis prior to admission which has been held and patient transitioned to IV heparin infusion per pharmacy dosing consult.   Heparin level therapeutic at 0.36 on 1200 units/hr.  Hemoglobin low but stable. Platelets are within normal limits.  Remains on CRRT No bleeding issues reported.  Goal of Therapy:  Heparin level 0.3-0.5 units/ml Monitor platelets by anticoagulation protocol: Yes   Plan:  Continue heparin at 1200 units/hr Monitor daily heparin level and CBC, s/sx bleeding   Arturo Morton, PharmD, BCPS Please check AMION for all [place] contact numbers Clinical Pharmacist 10/20/2020 11:33 AM

## 2020-10-20 NOTE — Progress Notes (Signed)
NAMESuni [name], MRN:  [id number], DOB:  Jul 22, 1951, LOS: 48 ADMISSION DATE:  08/31/2020, CONSULTATION DATE:  09/07/2020 REFERRING MD:  Dr Candace [name], CHIEF COMPLAINT:  Acute resp failure  Brief History   69 year old female who was previously diagnosed with Covid 08/23/2020.  Admitted 11/2 with AF-RVR, found to have a perforated duodenal underwent exploratory laparotomy with Phillip Heal patch placement.  Past Medical History  Covid pneumonia Atrial fibrillation CKD stage III Diabetes mellitus Hypertension Colon cancer Hyperlipidemia  Significant Hospital Events   11/2 Admitted  11/3 OR -> perforated duodenal ulcer 11/10 progressive hemorrhagic shock, intubated, transfused, pressors, proned; started on CRRT in PM 11/16 Extubated. Re-intubated overnight due to respiratory distress and hypoxia with decreased mentation 11/18  bronch'd/ cultures sent 11/19 hgb down getting blood 09/22/2020 spiked fever resume empirical antimicrobial therapy 11/26: hemorrhagic shock, hgb 5.6, increased pressors, CT A/P  11/30 per palliative "[name] expresses understanding that patient is unfortunately very fragile despite ongoing intensive medical treatment and full mechanical support. She indicates that the family wants to continue with all current interventions despite potential outcomes". 12/8: CRRT discontinued due to clotting 12/9 family requested transfer to tertiary care [facility]). They did not feel that they could offer further treatment and denied transfer  12/10-12/12: CRRT restarted. Continues to have episodes of tachycardia, tachypnea that seem to improve with pain management 12/11 back in shock. Pressor requirements  Up. CXR worse. ABX resumed 12/12 still requiring inc pressors. Had hypoglycemic event. Basal insulin dosing adjusted 12/13 pressor requirements better. Now hyperglycemic. Re-adjusted Glycemic control  12/14 changed dilaudid to 1/2 dosing from day further. Dc vasopressin.  Goals of care  reconfirmed with daughter.  Patient continues to desire aggressive care.  Not open to discussing any other option, patient family continues to be hopeful that she will be discharged to home with full recovery in spite of multiple attempts by staff to prepare them that this is unlikely scenario 12/15 Dilaudid discontinued sending cortisol for ongoing pressor dependence 12/16 serum cortisol less than 20, adding stress dose steroids to see if this helps with get off pressors.  Making small adjustment and as needed Dilaudid, attempting to not go back on Dilaudid infusion 12/17: Developed worsening tachycardia during the evening hours received initially IV albumin, followed by resuming IV Dilaudid with question of suboptimal pain control.  Currently looks better back on Dilaudid drip.  We have been able to wean pressors after adding stress dose steroids; near arrest - bradycardia, better with atropine   12/19 - Afebrile . Remains on dilaudid and heparin gtt, dilaudid gtt increased overnight for concern of pain/ ongoing tachycardia, no other events . NE and precedex off 12/17Ongoing CRRT- even UF, dosing lokelmia this morning  12/20 - on trach. On CRRT. On ostomy. Renal plans for HD cath and moving to HD. Gets wonds care  Consults:  Cardiology [facility] Nephrology   Procedures:  11/3 Exploratory laparotomy, Phillip Heal patch, lysis of adhesion for duodenal ulceration postop day 6  R PICC 11/5 >> A line 11/9 >> out ETT 11/9 > 11/16, 11/16 >> 09/21/2020 09/21/2020 tracheostomy>> Lt  CVL 11/9 >> R IJ trialysis >> out HD catheter 12/1 >>12/20 12/21 - 14.5 Fr, 23 cm right IJ tunneled hemodialysis catheter placement.  Removal of indwelling subclavian catheter.   Significant Diagnostic Tests:  11/3 CT abd/ pelvis > 1. Positive for bowel perforation: Pneumoperitoneum and intermediate density free fluid in the abdomen. Prior total colectomy. The specific site of perforation is unclear-oral contrast present to  the proximal  jejunum has not obviously leaked. Note that there may be small bowel loops adherent to the ventral abdominal wall along the greater curve of the stomach. 2. Extensive bilateral lower lung pneumonia. No pleural effusion. 3. Other abdominal and pelvic viscera are stable since 2015, including bilateral adrenal adenomas. Chronic renal parapelvic cysts. 4. Aortic Atherosclerosis 11/3 TTE > EF 70-75%, RV not well visualized, mildly reduced RV systolic function 51/88 CT chest/ abd/ pelvis> 1. Interval progression of diffuse bilateral hazy ground-glass airspace opacities with more focal areas of consolidation at the lung bases 2. Trace bilateral pleural effusions. 3. Postsurgical changes the abdomen as detailed above. No evidence for a postoperative abscess, however evaluation is limited by lack of IV contrast. 4. There is a 1.9 cm cystic appearing lesion located in the pancreatic body. This was not present on the patient's CT from 2015.  Follow-up with an outpatient contrast enhanced MRI is recommended. 5. The endometrial stripe appears diffusely thickened. Follow-up with pelvic ultrasound is recommended. Aortic Atherosclerosis 11/14 LE doppler studies > + DVT of right posterior tibial and peroneal vein, +dVT of left posterior tibial vein  11/26 CT ABD PEL > liver unremarkable, distended gallbladder with layering tiny gallstones versus sludge, no duct dilatation, mild hyperdensity right upper pole renal collecting system new.  No evidence of retroperitoneal bleeding.  Multifocal lower lobe predominant pulmonary infiltrates/pneumonia.  Small left pleural effusion.  Micro Data:  11/10 MRSA PCR > neg 11/10 BC x 2 > neg 09/22/2020 blood cultures x2>> S epi.  09/22/2020 sputum culture>> MRSA Blood 12/1 >> negative. S.epi 1 out of 2, likely contaminant BCx 2 12/12 >> neg  Antimicrobials:  azithro 11/2 >11/3 Ceftriaxone 11/2  Fluconazole 11/3 Zosyn 11/3 >> 11/7 Vanc 11/10 off Cefepime  11/10 > 11/16 09/22/2020 vancomycin for MRSA PNA >> 12/2  Vanc 12/11> 12/17 Zosyn 12/11> 12/17  Subjective:  Lying in bed in mild discomfort on vent No acute events overnight  Remains off pressors   Objective   Blood pressure 100/70, pulse (!) 117, temperature (!) 97.1 F (36.2 C), temperature source Axillary, resp. rate (!) 35, height 5\' 8"  (1.727 m), weight 79.5 kg, SpO2 99 %.    Vent Mode: PRVC FiO2 (%):  [30 %] 30 % Set Rate:  [28 bmp] 28 bmp Vt Set:  [450 mL] 450 mL PEEP:  [5 cmH20] 5 cmH20 Plateau Pressure:  [22 cmH20-27 cmH20] 25 cmH20   Intake/Output Summary (Last 24 hours) at 10/20/2020 1438 Last data filed at 10/20/2020 1437 Gross per 24 hour  Intake 3072.9 ml  Output 3494 ml  Net -421.1 ml   Filed Weights   10/18/20 0423 10/19/20 0444 10/20/20 0430  Weight: 78.9 kg 78.5 kg 79.5 kg   Physical Exam: General: Chronically ill appearing deconditioned elderly female on mechanical ventilation through trach in NAD HEENT: 6 cuffed shiley trach midline , MM pink/moist, PERRL,  Neuro: Will open eyes to verbal stimuli, unable to follow commands  CV: s1s2 regular rate and rhythm, no murmur, rubs, or gallops,  PULM: Mechanical breath sounds. No added breath sounds, mild tachypnea with vent dysharmony  GI: soft, bowel sounds active in all 4 quadrants, non-tender, non-distended, tolerating TF Extremities: warm/dry, no edema  Skin: no rashes or lesions  Resolved problems:  Previously hemorrhagic shock requiring PRBCs, Septic shock, MRSA pneumonia s/p 8 days abx Recurrent septic shock 12/11 -working dx recurrent HCAP c/b adrenal insuff - Completed 7 days of vanc and zosyn 12/17. Culture negative. 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 Head of bed elevated 30 degrees. Plateau pressures less than 30 cm H20.  Follow intermittent chest x-ray and ABG.   Ensure adequate pulmonary hygiene  VAP bundle in place  PAD protocol RASS goal 0 to -1  Ongoing leukocytosis -Stable trend, but remains afebrile, likely exacerbated by steroids P: Trend CBC and fever curve  Supportive care  No indication for repeat cultures or antibiotics   Acute metabolic encephalopathy in the setting of renal failure, shock, sepsis P: Continue current regiment of Seroquel, Clonazepam and Oxycodone Supportive care  Delirium precautions  Continue neuro checks  AKI -Oliguric/anuric Hyperkalemia  -Nephrology consulted. Deemed NOT long term HD candidate.  -CRRT started on 11/10; held 12/8; resumed on 12/10 P: Nephrology following, appreciate assistance  Increased UF today to help facilitate weaning  RIJ tunneled HD cath in place  Follow renal function  Trend Bmet Avoid nephrotoxins, ensure adequate renal perfusion   Duodenal ulcer perforation status post ex lap and Graham's patch -Appreciate CTS assistance and management P: Continue with wound care  RLQ colostomy WNL Continue hydrotherapy   Paroxysmal A. fib, currently in sinus rhythm P: Continue IV heparin per pharmacy  Continue to trend CBC  Continuous telemetry   Diabetes type 2 w/ marked Hyperglycemia -Much worse on steroids P: CBG goal 140-180 Continue SSI, Lantus, and TF coverage  Weaning steroids further today   Indeterminate bilateral DVT P: Continue IV heparin   History of stage IV colon cancer Status post pulmonary metastatectomies at [facility] + Chemo 1.9 cm cystic appearing lesion located in the pancreatic body P: Oncology follow-up   Oncology follow-up if survives  Best practice:  Diet: Tube feeds, these are being  tolerated Pain/Anxiety/Delirium protocol (if indicated) dilaudid gtt/ enteral sedation-  clonazepam, oxy, seroquel VAP protocol (if indicated): yes DVT prophylaxis: IV heparin GI prophylaxis: Protonix Glucose control: SSI, TF coverage, lantus Mobility: Bedrest Code Status: Full Goals of care discussion last completed on 12/14 with daughter, this was a multidisciplinary discussion including nurse practitioner, bedside nurse, and the patient's daughter.  There have been no changes in goals of care, family continues to desire any and all therapies, remains full code, family remains hopeful that she will survive and be discharged to home Next multidisciplinary goals of care discussion due: 12/21  Daughter, [name] asking to speak to physician specifically 12/18 at [phone] -> daughter called 12/21 -> daughter said she spoke to RN this moring post HD cath.   Goals of Care:  Last date of multidisciplinary goals of care discussion:12/14 Family and staff present:  nurse practitioner, bedside nurse, and the patient's daughter Summary of discussion:  There have been no changes in goals of care, family continues to desire any and all therapies, remains full code, family remains hopeful that she will survive and be discharged to home Follow up goals of care discussion due: 12/21 as of 12/22 family continues to desire full aggressive measures  Code Status: Full  LABS    PULMONARY Recent Labs  Lab 10/18/20 0507  PHART 7.387  PCO2ART 42.4  PO2ART 105  HCO3 24.9  O2SAT 98.4    CBC Recent Labs  Lab 10/19/20 0457 10/19/20  1836 10/20/20 0350  HGB 7.9* 8.0* 7.7*  HCT 24.6* 26.7* 26.4*  WBC 16.9* 16.1* 15.4*  PLT 261 288 294    COAGULATION No results for input(s): INR in the last 168 hours.  CARDIAC  No results for input(s): TROPONINI in the last 168 hours. No results for input(s): PROBNP in the last 168 hours.   CHEMISTRY Recent Labs  Lab 10/16/20 0310 10/16/20 1717  10/17/20 0631 10/17/20 1739 10/18/20 0248 10/18/20 1605 10/19/20 0457 10/19/20 1551 10/20/20 0350  NA 129*   < > 133*   < > 133* 132* 132* 131* 132*  K 5.4*   < > 4.3   < > 5.3* 4.7 4.3 4.3 4.4  CL 97*   < > 98   < > 98 98 100 97* 98  CO2 24   < > 25   < > 23 25 24 24 25   GLUCOSE 346*   < > 267*   < > 144* 264* 145* 212* 161*  BUN 51*   < > 49*   < > 50* 53* 38* 45* 37*  CREATININE 0.62   < > 0.65   < > 0.57 0.54 0.49 0.45 0.44  CALCIUM 9.8   < > 10.2   < > 10.2 10.3 10.0 10.1 10.1  MG 2.6*  --  2.6*  --  2.4  --  2.4  --  2.8*  PHOS 4.2   < > 3.3   < > 2.9 2.7 2.7 3.1 2.8   < > = values in this interval not displayed.   Estimated Creatinine Clearance: 73.4 mL/min (by C-G formula based on SCr of 0.44 mg/dL).   LIVER Recent Labs  Lab 10/18/20 1605 10/19/20 0457 10/19/20 1551 10/19/20 1836 10/20/20 0350  AST  --   --   --  74*  --   ALT  --   --   --  85*  --   ALKPHOS  --   --   --  332*  --   BILITOT  --   --   --  0.6  --   PROT  --   --   --  7.4  --   ALBUMIN 2.4* 2.3* 2.4* 2.4* 2.3*     INFECTIOUS Recent Labs  Lab 10/19/20 1836  LATICACIDVEN 1.6     ENDOCRINE CBG (last 3)  Recent Labs    10/20/20 0345 10/20/20 0802 10/20/20 1140  GLUCAP 167* 123* 226*     Signature:  Johnsie Cancel, NP-C [facility] Contact / Pager information can be found on Amion  10/20/2020, 3:28 PM

## 2020-10-20 NOTE — Progress Notes (Signed)
Physical Therapy Wound Treatment Patient Details  Name: Candace [name] MRN: [id number] Date of Birth: 09/27/1951  Today's Date: 10/20/2020 Time: 0922-1000 Time Calculation (min): 38 min  Subjective  Subjective: Lethargic on vent with intermittent eyes open. Patient and Family Stated Goals: Heal wound; maintain pt comfort per daughter  Pain Score:  0/10  Wound Assessment  Pressure Injury 09/25/20 Sacrum Bilateral;Medial Deep Tissue Pressure Injury - Purple or maroon localized area of discolored intact skin or blood-filled blister due to damage of underlying soft tissue from pressure and/or shear. Purple, non-blanchable, b (Active)  Wound Image   10/20/20 1220  Dressing Type ABD;Barrier Film (skin prep);Gauze (Comment);Moist to moist 10/20/20 1220  Dressing Changed;Clean;Dry;Intact 10/20/20 1220  Dressing Change Frequency Daily 10/20/20 1220  State of Healing Eschar 10/20/20 1220  Site / Wound Assessment Bleeding;Black;Yellow;Red;Purple 10/20/20 1220  % Wound base Red or Granulating 20% 10/20/20 1220  % Wound base Yellow/Fibrinous Exudate 60% 10/20/20 1220  % Wound base Black/Eschar 20% 10/20/20 1220  % Wound base Other/Granulation Tissue (Comment) 0% 10/20/20 1220  Peri-wound Assessment Excoriated 10/20/20 1220  Wound Length (cm) 16 cm 10/20/20 0941  Wound Width (cm) 11 cm 10/20/20 0941  Wound Depth (cm) 4 cm 10/20/20 0941  Wound Surface Area (cm^2) 176 cm^2 10/20/20 0941  Wound Volume (cm^3) 704 cm^3 10/20/20 0941  Tunneling (cm) 0 10/13/20 1200  Undermining (cm) 0 10/13/20 1200  Margins Unattached edges (unapproximated) 10/20/20 1220  Drainage Amount Minimal 10/20/20 1220  Drainage Description Serosanguineous 10/20/20 1220  Treatment Debridement (Selective);Hydrotherapy (Pulse lavage);Packing (Saline gauze) 10/20/20 1220   Santyl applied to wound bed prior to applying dressing.    Hydrotherapy Pulsed lavage therapy - wound location: sacrum Pulsed Lavage with Suction (psi): 12  psi Pulsed Lavage with Suction - Normal Saline Used: 1000 mL Pulsed Lavage Tip: Tip with splash shield Selective Debridement Selective Debridement - Location: sacrum Selective Debridement - Tools Used: Forceps;Scalpel Selective Debridement - Tissue Removed: eschar; non-viable tissue   Wound Assessment and Plan  Wound Therapy - Assess/Plan/Recommendations Wound Therapy - Clinical Statement: Wound is now largely necrotic adipose. Discussed with WOC for decreasing frequency to 3x/wk to allow for continued enzymatic debridement of Santyl and autolytic debridement. Wound Therapy - Functional Problem List: Global weakness and immobility Factors Delaying/Impairing Wound Healing: Diabetes Mellitus;Immobility;Multiple medical problems Hydrotherapy Plan: Debridement;Dressing change;Patient/family education;Pulsatile lavage with suction Wound Therapy - Frequency: 6X / week Wound Therapy - Follow Up Recommendations: Skilled nursing facility Wound Plan: See above  Wound Therapy Goals- Improve the function of patient's integumentary system by progressing the wound(s) through the phases of wound healing (inflammation - proliferation - remodeling) by: Decrease Necrotic Tissue to: 20% Decrease Necrotic Tissue - Progress: Progressing toward goal Increase Granulation Tissue to: 80% Increase Granulation Tissue - Progress: Progressing toward goal Goals/treatment plan/discharge plan were made with and agreed upon by patient/family: No, Patient unable to participate in goals/treatment/discharge plan and family unavailable Time For Goal Achievement: 7 days Wound Therapy - Potential for Goals: Fair  Goals will be updated until maximal potential achieved or discharge criteria met.  Discharge criteria: when goals achieved, discharge from hospital, MD decision/surgical intervention, no progress towards goals, refusal/missing three consecutive treatments without notification or medical reason.  GP  [name], PT, DPT Acute Rehabilitation Services Pager [phone] Office [phone]      [name] 10/20/2020, 12:24 PM

## 2020-10-21 DIAGNOSIS — I4891 Unspecified atrial fibrillation: Secondary | ICD-10-CM | POA: Diagnosis not present

## 2020-10-21 DIAGNOSIS — N179 Acute kidney failure, unspecified: Secondary | ICD-10-CM | POA: Diagnosis not present

## 2020-10-21 DIAGNOSIS — J80 Acute respiratory distress syndrome: Secondary | ICD-10-CM | POA: Diagnosis not present

## 2020-10-21 LAB — CBC
HCT: 27.4 % — ABNORMAL LOW (ref 36.0–46.0)
Hemoglobin: 8.1 g/dL — ABNORMAL LOW (ref 12.0–15.0)
MCH: 28.6 pg (ref 26.0–34.0)
MCHC: 29.6 g/dL — ABNORMAL LOW (ref 30.0–36.0)
MCV: 96.8 fL (ref 80.0–100.0)
Platelets: 323 10*3/uL (ref 150–400)
RBC: 2.83 MIL/uL — ABNORMAL LOW (ref 3.87–5.11)
RDW: 16.6 % — ABNORMAL HIGH (ref 11.5–15.5)
WBC: 20.4 10*3/uL — ABNORMAL HIGH (ref 4.0–10.5)
nRBC: 2.1 % — ABNORMAL HIGH (ref 0.0–0.2)

## 2020-10-21 LAB — MAGNESIUM: Magnesium: 2.5 mg/dL — ABNORMAL HIGH (ref 1.7–2.4)

## 2020-10-21 LAB — GLUCOSE, CAPILLARY
Glucose-Capillary: 212 mg/dL — ABNORMAL HIGH (ref 70–99)
Glucose-Capillary: 176 mg/dL — ABNORMAL HIGH (ref 70–99)
Glucose-Capillary: 195 mg/dL — ABNORMAL HIGH (ref 70–99)
Glucose-Capillary: 336 mg/dL — ABNORMAL HIGH (ref 70–99)
Glucose-Capillary: 279 mg/dL — ABNORMAL HIGH (ref 70–99)
Glucose-Capillary: 224 mg/dL — ABNORMAL HIGH (ref 70–99)

## 2020-10-21 LAB — RENAL FUNCTION PANEL
Albumin: 2.4 g/dL — ABNORMAL LOW (ref 3.5–5.0)
Albumin: 2.4 g/dL — ABNORMAL LOW (ref 3.5–5.0)
Anion gap: 9 (ref 5–15)
Anion gap: 13 (ref 5–15)
BUN: 39 mg/dL — ABNORMAL HIGH (ref 8–23)
BUN: 40 mg/dL — ABNORMAL HIGH (ref 8–23)
CO2: 25 mmol/L (ref 22–32)
CO2: 21 mmol/L — ABNORMAL LOW (ref 22–32)
Calcium: 10.6 mg/dL — ABNORMAL HIGH (ref 8.9–10.3)
Calcium: 11.2 mg/dL — ABNORMAL HIGH (ref 8.9–10.3)
Chloride: 98 mmol/L (ref 98–111)
Chloride: 96 mmol/L — ABNORMAL LOW (ref 98–111)
Creatinine, Ser: 0.48 mg/dL (ref 0.44–1.00)
Creatinine, Ser: 0.51 mg/dL (ref 0.44–1.00)
GFR, Estimated: >60 mL/min (ref >60)
GFR, Estimated: >60 mL/min (ref >60)
Glucose, Bld: 241 mg/dL — ABNORMAL HIGH (ref 70–99)
Glucose, Bld: 364 mg/dL — ABNORMAL HIGH (ref 70–99)
Phosphorus: 2.9 mg/dL (ref 2.5–4.6)
Phosphorus: 4.8 mg/dL — ABNORMAL HIGH (ref 2.5–4.6)
Potassium: 4.9 mmol/L (ref 3.5–5.1)
Potassium: 4.7 mmol/L (ref 3.5–5.1)
Sodium: 132 mmol/L — ABNORMAL LOW (ref 135–145)
Sodium: 130 mmol/L — ABNORMAL LOW (ref 135–145)

## 2020-10-21 LAB — HEPARIN LEVEL (UNFRACTIONATED): Heparin Unfractionated: 0.37 IU/mL (ref 0.30–0.70)

## 2020-10-21 MED ORDER — DARBEPOETIN ALFA 100 MCG/0.5ML IJ SOSY
100.0000 ug | PREFILLED_SYRINGE | INTRAMUSCULAR | Status: DC
  Administered 2020-10-26 – 2020-11-02 (×2): 100 ug via SUBCUTANEOUS
  Filled 2020-10-21 (×2): qty 0.5

## 2020-10-21 NOTE — Progress Notes (Signed)
Patient ID: Candace [name], female   DOB: 30-Aug-1951, 69 y.o.   MRN: [id number]    S:  on CRRT. Hemodynamically stable - no UOP- removed 1100 with CRRT    O:BP 127/85   Pulse (!) 127   Temp (!) 97.4 F (36.3 C) (Axillary)   Resp (!) 21   Ht 5' 8"  (1.727 m)   Wt 77.1 kg   SpO2 94%   BMI 25.84 kg/m   Intake/Output Summary (Last 24 hours) at 10/21/2020 0923 Last data filed at 10/21/2020 0900 Gross per 24 hour  Intake 2995.79 ml  Output 4207 ml  Net -1211.21 ml   Intake/Output: I/O last 3 completed shifts: In: 4209.8 [I.V.:819.8; NG/GT:3290; IV Piggyback:100.1] Out: 5406 [IDUPB:3578; Stool:740]  Intake/Output this shift:  Total I/O In: 239 [I.V.:44; NG/GT:195] Out: 299 [Other:299] Weight change: -2.4 kg Gen: on vent via trach, flat affect- no commands  CVS: tachy at 119 Resp: scattered rhonchi Abd: +BS, soft, ostomy in RLQ Ext: trace dependent edema  Recent Labs  Lab 10/18/20 0248 10/18/20 1605 10/19/20 0457 10/19/20 1551 10/19/20 1836 10/20/20 0350 10/20/20 1633 10/21/20 0308  NA 133* 132* 132* 131*  --  132* 133* 132*  K 5.3* 4.7 4.3 4.3  --  4.4 4.6 4.9  CL 98 98 100 97*  --  98 99 98  CO2 23 25 24 24   --  25 26 25   GLUCOSE 144* 264* 145* 212*  --  161* 169* 241*  BUN 50* 53* 38* 45*  --  37* 40* 39*  CREATININE 0.57 0.54 0.49 0.45  --  0.44 0.48 0.48  ALBUMIN 2.5* 2.4* 2.3* 2.4* 2.4* 2.3* 2.4* 2.4*  CALCIUM 10.2 10.3 10.0 10.1  --  10.1 10.3 10.6*  PHOS 2.9 2.7 2.7 3.1  --  2.8 2.8 2.9  AST  --   --   --   --  74*  --   --   --   ALT  --   --   --   --  85*  --   --   --    Liver Function Tests: Recent Labs  Lab 10/19/20 1836 10/20/20 0350 10/20/20 1633 10/21/20 0308  AST 74*  --   --   --   ALT 85*  --   --   --   ALKPHOS 332*  --   --   --   BILITOT 0.6  --   --   --   PROT 7.4  --   --   --   ALBUMIN 2.4* 2.3* 2.4* 2.4*   No results for input(s): LIPASE, AMYLASE in the last 168 hours. No results for input(s): AMMONIA in the last 168  hours. CBC: Recent Labs  Lab 10/18/20 0248 10/19/20 0457 10/19/20 1836 10/20/20 0350 10/21/20 0308  WBC 17.9* 16.9* 16.1* 15.4* 20.4*  HGB 7.4* 7.9* 8.0* 7.7* 8.1*  HCT 25.2* 24.6* 26.7* 26.4* 27.4*  MCV 98.4 93.5 96.4 96.7 96.8  PLT 260 261 288 294 323   Cardiac Enzymes: No results for input(s): CKTOTAL, CKMB, CKMBINDEX, TROPONINI in the last 168 hours. CBG: Recent Labs  Lab 10/20/20 1628 10/20/20 1933 10/20/20 2343 10/21/20 0312 10/21/20 0722  GLUCAP 153* 194* 212* 212* 176*    Iron Studies: No results for input(s): IRON, TIBC, TRANSFERRIN, FERRITIN in the last 72 hours. Studies/Results: IR Fluoro Guide CV Line Right  Result Date: 10/19/2020 INDICATION: 69 year old female with acute on chronic kidney disease requiring permanent hemodialysis access. EXAM: TUNNELED CENTRAL VENOUS  HEMODIALYSIS CATHETER PLACEMENT WITH ULTRASOUND AND FLUOROSCOPIC GUIDANCE MEDICATIONS: Ancef 2 gm IV . The antibiotic was given in an appropriate time interval prior to skin puncture. ANESTHESIA/SEDATION: Moderate (conscious) sedation was employed during this procedure. A total of Versed 2 mg and Fentanyl 100 mcg was administered intravenously. Moderate Sedation Time: 12 minutes. The patient's level of consciousness and vital signs were monitored continuously by radiology nursing throughout the procedure under my direct supervision. FLUOROSCOPY TIME:  0 minutes 12 seconds (70 mGy). COMPLICATIONS: None immediate. PROCEDURE: Informed written consent was obtained from the patient after a discussion of the risks, benefits, and alternatives to treatment. Questions regarding the procedure were encouraged and answered. The right neck and chest were prepped with chlorhexidine in a sterile fashion, and a sterile drape was applied covering the operative field. Maximum barrier sterile technique with sterile gowns and gloves were used for the procedure. A timeout was performed prior to the initiation of the procedure.  After creating a small venotomy incision, a 21 gauge micropuncture kit was utilized to access the internal jugular vein. Real-time ultrasound guidance was utilized for vascular access including the acquisition of a permanent ultrasound image documenting patency of the accessed vessel. A Rosen wire was advanced to the level of the IVC and the micropuncture sheath was exchanged for an 8 Fr dilator. A 14.5 French tunneled hemodialysis catheter measuring 23 cm from tip to cuff was tunneled in a retrograde fashion from the anterior chest wall to the venotomy incision. Serial dilation was then performed an a peel-away sheath was placed. The catheter was then placed through the peel-away sheath with the catheter tip ultimately positioned within the right atrium. Final catheter positioning was confirmed and documented with a spot radiographic image. The catheter aspirates and flushes normally. The catheter was flushed with appropriate volume heparin dwells. The catheter exit site was secured with a 0-Silk retention suture. The venotomy incision was closed with Dermabond. Sterile dressings were applied. The patient tolerated the procedure well without immediate post procedural complication. IMPRESSION: Successful placement of 23 cm tip to cuff tunneled hemodialysis catheter via the right internal jugular vein with catheter tip terminating within the right atrium. The catheter is ready for immediate use. Ruthann Cancer, MD [facility] Radiology Electronically Signed   By: Ruthann Cancer MD   On: 10/19/2020 10:03   IR US Guide Vasc Access Right  Result Date: 10/19/2020 INDICATION: 69 year old female with acute on chronic kidney disease requiring permanent hemodialysis access. EXAM: TUNNELED CENTRAL VENOUS HEMODIALYSIS CATHETER PLACEMENT WITH ULTRASOUND AND FLUOROSCOPIC GUIDANCE MEDICATIONS: Ancef 2 gm IV . The antibiotic was given in an appropriate time interval prior to skin  puncture. ANESTHESIA/SEDATION: Moderate (conscious) sedation was employed during this procedure. A total of Versed 2 mg and Fentanyl 100 mcg was administered intravenously. Moderate Sedation Time: 12 minutes. The patient's level of consciousness and vital signs were monitored continuously by radiology nursing throughout the procedure under my direct supervision. FLUOROSCOPY TIME:  0 minutes 12 seconds (70 mGy). COMPLICATIONS: None immediate. PROCEDURE: Informed written consent was obtained from the patient after a discussion of the risks, benefits, and alternatives to treatment. Questions regarding the procedure were encouraged and answered. The right neck and chest were prepped with chlorhexidine in a sterile fashion, and a sterile drape was applied covering the operative field. Maximum barrier sterile technique with sterile gowns and gloves were used for the procedure. A timeout was performed prior to the initiation of the procedure. After creating a small venotomy  incision, a 21 gauge micropuncture kit was utilized to access the internal jugular vein. Real-time ultrasound guidance was utilized for vascular access including the acquisition of a permanent ultrasound image documenting patency of the accessed vessel. A Rosen wire was advanced to the level of the IVC and the micropuncture sheath was exchanged for an 8 Fr dilator. A 14.5 French tunneled hemodialysis catheter measuring 23 cm from tip to cuff was tunneled in a retrograde fashion from the anterior chest wall to the venotomy incision. Serial dilation was then performed an a peel-away sheath was placed. The catheter was then placed through the peel-away sheath with the catheter tip ultimately positioned within the right atrium. Final catheter positioning was confirmed and documented with a spot radiographic image. The catheter aspirates and flushes normally. The catheter was flushed with appropriate volume heparin dwells. The catheter exit site was secured  with a 0-Silk retention suture. The venotomy incision was closed with Dermabond. Sterile dressings were applied. The patient tolerated the procedure well without immediate post procedural complication. IMPRESSION: Successful placement of 23 cm tip to cuff tunneled hemodialysis catheter via the right internal jugular vein with catheter tip terminating within the right atrium. The catheter is ready for immediate use. Ruthann Cancer, MD [facility] Radiology Electronically Signed   By: Ruthann Cancer MD   On: 10/19/2020 10:03   DG CHEST PORT 1 VIEW  Result Date: 10/20/2020 CLINICAL DATA:  Shortness of breath.  COVID-19 positive EXAM: PORTABLE CHEST 1 VIEW COMPARISON:  October 19, 2020 FINDINGS: Tracheostomy catheter tip is 5.6 cm above the carina. Enteric tube tip is below the diaphragm. Central catheter tip is in the superior vena cava. No pneumothorax. Airspace opacity in both mid and lower lower lung regions, somewhat more on the left than on the right, remains stable. Small right pleural effusion. No new opacity. Heart size is within normal limits. Pulmonary vascularity is normal. No adenopathy. No bone lesions. IMPRESSION: Airspace opacity consistent with multifocal pneumonia persists without appreciable change. Stable small right pleural effusion. Stable cardiac silhouette. Tube and catheter positions as described without pneumothorax. Electronically Signed   By: Lowella Grip III M.D.   On: 10/20/2020 07:54   DG CHEST PORT 1 VIEW  Result Date: 10/19/2020 CLINICAL DATA:  Acute on chronic respiratory failure EXAM: PORTABLE CHEST 1 VIEW COMPARISON:  10/17/2020 at 5:01 a.m. FINDINGS: Single frontal view of the chest demonstrates stable tracheostomy tube, enteric catheter, and right-sided PICC. The right subclavian catheter seen previously has been removed, with a new right internal jugular catheter tip overlying superior vena cava. Cardiac silhouette is  stable. Stable bibasilar consolidation and likely small effusions. No pneumothorax. IMPRESSION: 1. Stable bibasilar airspace disease and effusions. 2. Support devices as above. Electronically Signed   By: Randa Ngo M.D.   On: 10/19/2020 18:45   . B-complex with vitamin C  1 tablet Per Tube Daily  . chlorhexidine gluconate (MEDLINE KIT)  15 mL Mouth Rinse BID  . Chlorhexidine Gluconate Cloth  6 each Topical Daily  . cholecalciferol  2,000 Units Per Tube Daily  . clonazepam  1 mg Per Tube BID  . collagenase   Topical Daily  . darbepoetin (ARANESP) injection - DIALYSIS  60 mcg Subcutaneous Q Tue-1800  . docusate  100 mg Per Tube BID  . feeding supplement (PROSource TF)  90 mL Per Tube TID  . guaiFENesin  15 mL Per Tube Q6H  . insulin aspart  0-15 Units Subcutaneous Q4H  . insulin  aspart  7 Units Subcutaneous Q4H  . insulin glargine  40 Units Subcutaneous Daily  . mouth rinse  15 mL Mouth Rinse QID  . midodrine  20 mg Per Tube TID WC  . nutrition supplement (JUVEN)  1 packet Per Tube BID BM  . oxyCODONE  5 mg Per Tube Q4H  . pantoprazole sodium  40 mg Per Tube BID  . polyethylene glycol  17 g Per Tube Daily  . QUEtiapine  100 mg Per Tube BID  . sodium chloride flush  10-40 mL Intracatheter Q12H    BMET    Component Value Date/Time   NA 132 (L) 10/21/2020 0308   K 4.9 10/21/2020 0308   CL 98 10/21/2020 0308   CO2 25 10/21/2020 0308   GLUCOSE 241 (H) 10/21/2020 0308   BUN 39 (H) 10/21/2020 0308   CREATININE 0.48 10/21/2020 0308   CALCIUM 10.6 (H) 10/21/2020 0308   GFRNONAA >60 10/21/2020 0308   CBC    Component Value Date/Time   WBC 20.4 (H) 10/21/2020 0308   RBC 2.83 (L) 10/21/2020 0308   HGB 8.1 (L) 10/21/2020 0308   HCT 27.4 (L) 10/21/2020 0308   PLT 323 10/21/2020 0308   MCV 96.8 10/21/2020 0308   MCH 28.6 10/21/2020 0308   MCHC 29.6 (L) 10/21/2020 0308   RDW 16.6 (H) 10/21/2020 0308   LYMPHSABS 0.4 (L) 10/04/2020 0516   MONOABS 0.2 10/04/2020 0516   EOSABS 0.0  10/04/2020 0516   BASOSABS 0.0 10/04/2020 0516     Assessment/Plan:  1. AKI- oliguric presumably due to ischemic ATN in setting of hemorrhagic shock. Started on CRRT 09/08/20 due to persistent oliguria and hyperkalemia. 1. All fluids 4K/2.5Ca: Pre-filter to 500 ml/hr, post-filter 300 ml/hr, and dialysate to 1500 ml/hr.  2. Will continue UF  50 ml/hr given rhonchi, edema, and difficulty with weaning. 3. No anticoagulation due to thrombocytopenia and ongoing GI blood loss. 4. Continue with current CRRT settings. Pt has deteriorated several times when trying to transition to IHD but has been a week-  Will plan to stop CRRT tomorrow-  Then attempt IHD on Sunday 12/26 in ICU  5. RIJ TDC placed 10/19/20 by IR 2. Hemorrhagic shock- ongoing blood loss and requiring pressors. 1. Transfuse as needed-  Will add ESA  3. Septic shock due to MRSA pneumonia. 4. Perforated duodenal ulcer- s/p exploratory lap and Graham patch placement per surgery. 5. Acute hypoxic/hypercapnic respiratory failure due to ARDS from Recent Covid-19 PNA- currently on vent via trach per PCCM. low o2 6. P. Atrial fibrillation- no anticoagulation due to GI bleed.  7. Severe protein malnutrition- per primary svc. 8. Disposition- poor overall prognosis and will likely require LTC and chronic vent support. PCCM to have ongoing goals of care meetings with family.   Louis Meckel  [employer] [phone]

## 2020-10-21 NOTE — Progress Notes (Signed)
NAMEFelina [name], MRN:  [id number], DOB:  1951-01-17, LOS: 42 ADMISSION DATE:  08/31/2020, CONSULTATION DATE:  09/07/2020 REFERRING MD:  Dr Candiss Norse, CHIEF COMPLAINT:  Acute resp failure  Brief History   69 year old female who was previously diagnosed with Covid 08/23/2020.  Admitted 11/2 with AF-RVR, found to have a perforated duodenal underwent exploratory laparotomy with Phillip Heal patch placement.  Past Medical History  Covid pneumonia Atrial fibrillation CKD stage III Diabetes mellitus Hypertension Colon cancer Hyperlipidemia  Significant Hospital Events   11/2 Admitted  11/3 OR with findings of perforated duodenal ulcer 11/10 progressive hemorrhagic shock, intubated, transfused, pressors, proned; started on CRRT in PM 11/03 Exploratory laparotomy, Phillip Heal patch, lysis of adhesion for duodenal ulceration postop day 6 11/16 Extubated. Re-intubated overnight due to respiratory distress and hypoxia with decreased mentation 11/18 Bronch, cultures sent 11/19 Hgb down getting blood 11/24 Spiked fever resume empirical antimicrobial therapy 11/26 Hemorrhagic shock, hgb 5.6, increased pressors, CT A/P  11/30 Per palliative "[name] expresses understanding that patient is unfortunately very fragile despite ongoing intensive medical treatment and full mechanical support. She indicates that the family wants to continue with all current interventions despite potential outcomes". 12/08 CRRT discontinued due to clotting 12/09 Family requested transfer to tertiary care [facility]). [facility] denied transfer  12/10 CRRT restarted. Episodes of tachycardia, tachypnea that seem to improve with pain management 12/11 Back in shock. Pressor requirements up. CXR worse. ABX resumed 12/12 Still requiring inc pressors. Had hypoglycemic event. Basal insulin dosing adjusted 12/13 Pressor requirements better. Now hyperglycemic. Re-adjusted Glycemic control  12/14 Changed dilaudid to1/2 dosing from day further. D/c  vasopressin.  Goals of care reconfirmed with daughter.  Patient continues to desire aggressive care.  Not open to discussing any other option, patient family continues to be hopeful that she will be discharged to home with full recovery in spite of multiple attempts by staff to prepare them that this is unlikely scenario 12/15 Dilaudid discontinued sending cortisol for ongoing pressor dependence 12/16 Serum cortisol <20, added stress dose steroids.  PRN Dilaudid, attempting not go back on Dilaudid infusion 12/17 Developed worsening tachycardia during the evening hours received initially IV albumin, followed by resuming IV Dilaudid with question of suboptimal pain control.  Currently looks better back on Dilaudid drip.  We have been able to wean pressors after adding stress dose steroids; near arrest - bradycardia, better with atropine  12/19 Afebrile . Remains on dilaudid and heparin gtt, dilaudid gtt increased overnight for concern of pain / ongoing tachycardia, no other events . NE and precedex off 12/17. Ongoing CRRT- even UF, dosing lokelmia this morning 12/20 On CRRT.  Renal plans for HD cath and moving to HD. Getting wound care  Consults:  Cardiology [facility] Nephrology   Procedures:  R PICC 11/5 >> A line 11/9 >> out ETT 11/9 > 11/16, 11/16 >> 09/21/2020 09/21/2020 tracheostomy>> Lt [place] CVL 11/9 >> R IJ trialysis >> out HD catheter 12/1 >>12/20 12/21 - 14.5 Fr, 23 cm right IJ tunneled hemodialysis catheter placement.  Removal of indwelling subclavian catheter.   Significant Diagnostic Tests:  11/3 CT abd/ pelvis > 1. Positive for bowel perforation: Pneumoperitoneum and intermediate density free fluid in the abdomen. Prior total colectomy. The specific site of perforation is unclear-oral contrast present to the proximal jejunum has not obviously leaked. Note that there may be small bowel loops adherent to the ventral abdominal wall along the greater curve of the stomach. 2. Extensive  bilateral lower lung pneumonia. No pleural effusion. 3. Other  abdominal and pelvic viscera are stable since 2015, including bilateral adrenal adenomas. Chronic renal parapelvic cysts. 4. Aortic Atherosclerosis 11/3 TTE > EF 70-75%, RV not well visualized, mildly reduced RV systolic function 26/37 CT chest/ abd/ pelvis> 1. Interval progression of diffuse bilateral hazy ground-glass airspace opacities with more focal areas of consolidation at the lung bases 2. Trace bilateral pleural effusions. 3. Postsurgical changes the abdomen as detailed above. No evidence for a postoperative abscess, however evaluation is limited by lack of IV contrast. 4. There is a 1.9 cm cystic appearing lesion located in the pancreatic body. This was not present on the patient's CT from 2015.  Follow-up with an outpatient contrast enhanced MRI is recommended. 5. The endometrial stripe appears diffusely thickened. Follow-up with pelvic ultrasound is recommended. Aortic Atherosclerosis 11/14 LE doppler studies > + DVT of right posterior tibial and peroneal vein, +dVT of left posterior tibial vein  11/26 CT ABD PEL > liver unremarkable, distended gallbladder with layering tiny gallstones versus sludge, no duct dilatation, mild hyperdensity right upper pole renal collecting system new.  No evidence of retroperitoneal bleeding.  Multifocal lower lobe predominant pulmonary infiltrates/pneumonia.  Small left pleural effusion.  Micro Data:  11/10 MRSA PCR > neg 11/10 BC x 2 > neg 09/22/2020 blood cultures x2>> S epi.  09/22/2020 sputum culture>> MRSA Blood 12/1 >> negative. S.epi 1 out of 2, likely contaminant BCx 2 12/12 >> neg  Antimicrobials:  azithro 11/2 >11/3 Ceftriaxone 11/2  Fluconazole 11/3 Zosyn 11/3 >> 11/7 Vanc 11/10 off Cefepime 11/10 > 11/16 09/22/2020 vancomycin for MRSA PNA >> 12/2  Vanc 12/11> 12/17 Zosyn 12/11> 12/17  Subjective:  No vent weaning per RT, remains on full support, #8 trach in place  but previously had #6, no documentation of change noted in chart Afebrile / WBC 20.4  Vent - 30% FiO2, PEEP 5 Glucose range 176-212 I/O 465 ml stool, 3.6L removed with HD, -1.1L in last 24 hours  RN reports ongoing periods of tachypnea / vent dyssynchrony that responds to dilaudid   Objective   Blood pressure 116/65, pulse (!) 119, temperature (!) 97.4 F (36.3 C), temperature source Axillary, resp. rate (!) 26, height 5\' 8"  (1.727 m), weight 77.1 kg, SpO2 95 %.    Vent Mode: PRVC FiO2 (%):  [30 %-40 %] 40 % Set Rate:  [28 bmp] 28 bmp Vt Set:  [450 mL] 450 mL PEEP:  [5 cmH20] 5 cmH20 Plateau Pressure:  [21 CHY85-02 cmH20] 21 cmH20   Intake/Output Summary (Last 24 hours) at 10/21/2020 1120 Last data filed at 10/21/2020 1100 Gross per 24 hour  Intake 2704.99 ml  Output 3899 ml  Net -1194.01 ml   Filed Weights   10/19/20 0444 10/20/20 0430 10/21/20 0600  Weight: 78.5 kg 79.5 kg 77.1 kg   Physical Exam: General: chronically ill appearing adult female lying in bed on vent  HEENT: MM pink/moist, #8 trach midline c/d/i, anicteric, tracks  Neuro: eyes open, tracks, pupils =/reactive  CV: s1s2 RRR, ST 120's on monitor, no m/r/g PULM: tachypnea, periods of vent dyssynchrony, coarse rhonchi bilaterally  GI: soft, bsx4 active  Extremities: warm/dry, no edema  Skin: no rashes or lesions Vascular Access: R tunneled perm HD cath, R arm PICC - both c/d/i  Resolved problems:  Previously hemorrhagic shock requiring PRBCs, Septic shock, MRSA pneumonia s/p 8 days abx Recurrent septic shock 12/11 -working dx recurrent HCAP c/b adrenal insuff - Completed 7 days of vanc and zosyn 12/17. Culture negative. 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  Has not been able to make progress with vent weaning, remains -PRVC /  lung protective ventilation  -continue SBT trials, may need to accept a degree of tachypnea  -follow intermittent CXR -re-culture tracheal aspirate if fever >101.5  -VAP prevention measures  Ongoing leukocytosis -trend WBC -most recent cultures 12/12 and negative  -would not reculture unless fever >626.9  Acute metabolic encephalopathy in the setting of renal failure, shock, sepsis -continue PT oxycodone, klonopin, seroquel  -PT efforts as able  -follow neuro exam   Oliguric / Anuric AKI Hyperkalemia  Nephrology consulted. Deemed NOT long term HD candidate.  CRRT started on 11/10; held 12/8; resumed on 12/10 -appreciate Nephrology  -UF goal -86ml/hr  -Trend BMP / urinary output -Replace electrolytes as indicated   Duodenal ulcer perforation status post ex lap and Graham's patch Appreciate CTS assistance and management -wound care  -Follow colostomy  -hydrotherapy per WOC   Paroxysmal A. fib, currently in sinus rhythm -heparin gtt per pharmacy  -follow CBC  -transfuse for Hgb <7%  Diabetes type 2 w/ marked Hyperglycemia -CBG goal 140-180 -SSI, lantus, TF coverage -improved off steroids  Indeterminate bilateral DVT -IV heparin per pharmacy   History of stage IV colon cancer Status post pulmonary metastatectomies at [facility] + Chemo 1.9 cm cystic appearing lesion located in the pancreatic body -will need ONC follow up if survives critical illness   Best practice:  Diet: TF Pain/Anxiety/Delirium protocol (if indicated) dilaudid gtt/ enteral sedation-  clonazepam, oxy, seroquel VAP protocol (if indicated): yes DVT prophylaxis: IV heparin GI prophylaxis: Protonix Glucose control: SSI, TF coverage, lantus Mobility: Bedrest  Goals of Care:  Last date of multidisciplinary goals of care discussion:12/14 Family and staff present:  nurse practitioner, bedside nurse, and the patient's daughter Summary of discussion:  There have been no changes in goals of care, family  continues to desire any and all therapies, remains full code, family remains hopeful that she will survive and be discharged to home Follow up goals of care discussion due: no change in goals of care.   Code Status: Full  LABS    PULMONARY Recent Labs  Lab 10/18/20 0507  PHART 7.387  PCO2ART 42.4  PO2ART 105  HCO3 24.9  O2SAT 98.4    CBC Recent Labs  Lab 10/19/20 1836 10/20/20 0350 10/21/20 0308  HGB 8.0* 7.7* 8.1*  HCT 26.7* 26.4* 27.4*  WBC 16.1* 15.4* 20.4*  PLT 288 294 323    COAGULATION No results for input(s): INR in the last 168 hours.  CARDIAC  No results for input(s): TROPONINI in the last 168 hours. No results for input(s): PROBNP in the last 168 hours.   CHEMISTRY Recent Labs  Lab 10/17/20 0631 10/17/20 1739 10/18/20 0248 10/18/20 1605 10/19/20 0457 10/19/20 1551 10/20/20 0350 10/20/20 1633 10/21/20 0308  NA 133*   < > 133*   < > 132* 131* 132* 133* 132*  K 4.3   < > 5.3*   < > 4.3 4.3 4.4 4.6 4.9  CL 98   < > 98   < > 100 97* 98 99 98  CO2 25   < > 23   < > 24 24 25 26 25   GLUCOSE 267*   < > 144*   < > 145* 212* 161* 169* 241*  BUN 49*   < >  50*   < > 38* 45* 37* 40* 39*  CREATININE 0.65   < > 0.57   < > 0.49 0.45 0.44 0.48 0.48  CALCIUM 10.2   < > 10.2   < > 10.0 10.1 10.1 10.3 10.6*  MG 2.6*  --  2.4  --  2.4  --  2.8*  --  2.5*  PHOS 3.3   < > 2.9   < > 2.7 3.1 2.8 2.8 2.9   < > = values in this interval not displayed.   Estimated Creatinine Clearance: 72.5 mL/min (by C-G formula based on SCr of 0.48 mg/dL).   LIVER Recent Labs  Lab 10/19/20 1551 10/19/20 1836 10/20/20 0350 10/20/20 1633 10/21/20 0308  AST  --  74*  --   --   --   ALT  --  85*  --   --   --   ALKPHOS  --  332*  --   --   --   BILITOT  --  0.6  --   --   --   PROT  --  7.4  --   --   --   ALBUMIN 2.4* 2.4* 2.3* 2.4* 2.4*     INFECTIOUS Recent Labs  Lab 10/19/20 1836  LATICACIDVEN 1.6     ENDOCRINE CBG (last 3)  Recent Labs    10/20/20 2343  10/21/20 0312 10/21/20 0722  GLUCAP 212* 212* 176*     Signature:   Noe Gens, MSN, NP-C, AGACNP-BC [facility] 10/21/2020, 11:20 AM   Please see [email or web address] for pager details.

## 2020-10-21 NOTE — Progress Notes (Signed)
Pt's daughter Candace [name] stated she will like all documents in regard of pt that need to be signed for pt, to be signed by her.

## 2020-10-21 NOTE — Progress Notes (Signed)
Physical Therapy Treatment Patient Details Name: Candace [name] MRN: [id number] DOB: 01/24/1951 Today's Date: 10/21/2020    History of Present Illness 69 y.o. female with medical history significant for recent covid pna, obesity, colon cancer s/p colon resection with colostomy bag, HLD, NIDDM2, CKD3 presented to ED after her follow-up nurse advised her to present to ED for elevated HR. +afib, elevated troponins with demand ischemia,  CT scan is positive for bowel perforation with pneumomediastinum 11/03 exp lap with repair of perforated ulcer. 11/10 t/f to ICU- intubated/sedated/proned. Trach placed 11/24. CRRT off 12/8, restarted 12/10.    PT Comments    Pt awake with continued response to bil hand squeezes although weak and not able to fully flex digits. Pt with trace activation of right tricep today but not other noted muscle twitch in trunk or extremities this session with decreased command following for oralmotor movement and responses.    HR 123 FiO2 30% on PRVC with RR 30 and SpO2 100%   Follow Up Recommendations  LTACH;Supervision/Assistance - 24 hour     Equipment Recommendations  Wheelchair (measurements PT);Wheelchair cushion (measurements PT);Hospital bed;Other (comment)    Recommendations for Other Services       Precautions / Restrictions Precautions Precautions: Fall Precaution Comments: baseline colostomy; trach-vent; CRRT; A line, sacral wound    Mobility  Bed Mobility Overal bed mobility: Needs Assistance Bed Mobility: Supine to Sit;Sit to Supine     Supine to sit: Total assist Sit to supine: Total assist   General bed mobility comments: total assist to positioning into and out of chair position. total +2 assist to perform anterior translation of trunk away from surface in full sitting with not noted engagment of core to support trunk and maintain sitting x 5 trials. Total +2 to slide toward [facility]  Transfers                 General transfer comment:  unable  Ambulation/Gait                 Stairs             Wheelchair Mobility    Modified Rankin (Stroke Patients Only)       Balance                                            Cognition Arousal/Alertness: Awake/alert Behavior During Therapy: Flat affect Overall Cognitive Status: Difficult to assess Area of Impairment: Attention                   Current Attention Level: Focused   Following Commands: Follows one step commands inconsistently;Follows one step commands with increased time       General Comments: pt with eyes open following commands grossly 30% of the time for response to hand squeezes only. Very limited toe wiggle on right today and no other facial response to commands or head nods      Exercises General Exercises - Upper Extremity Shoulder Flexion: PROM;Both;10 reps;Seated Elbow Flexion: Both;PROM;10 reps;Seated Elbow Extension: Both;PROM;10 reps;Seated (pt with trace activation of tricep on RUE) General Exercises - Lower Extremity Ankle Circles/Pumps: PROM;Both;10 reps;Seated Long Arc Quad: PROM;Both;Seated;10 reps Hip ABduction/ADduction: PROM;10 reps;Both;Seated Hip Flexion/Marching: PROM;Both;Seated;10 reps    General Comments        Pertinent Vitals/Pain Pain Score: 4  Pain Location: grimace with right elbow flexion Pain Descriptors / Indicators:  Grimacing Pain Intervention(s): Limited activity within patient's tolerance;Monitored during session;Repositioned    Home Living                      Prior Function            PT Goals (current goals can now be found in the care plan section) Progress towards PT goals: Progressing toward goals    Frequency    Min 2X/week      PT Plan Current plan remains appropriate    Co-evaluation              AM-PAC PT "6 Clicks" Mobility   Outcome Measure  Help needed turning from your back to your side while in a flat bed without using  bedrails?: Total Help needed moving from lying on your back to sitting on the side of a flat bed without using bedrails?: Total Help needed moving to and from a bed to a chair (including a wheelchair)?: Total Help needed standing up from a chair using your arms (e.g., wheelchair or bedside chair)?: Total Help needed to walk in hospital room?: Total Help needed climbing 3-5 steps with a railing? : Total 6 Click Score: 6    End of Session   Activity Tolerance: Patient limited by lethargy Patient left: in bed;with call bell/phone within reach Nurse Communication: Mobility status PT Visit Diagnosis: Muscle weakness (generalized) (M62.81);Difficulty in walking, not elsewhere classified (R26.2);Pain;Adult, failure to thrive (R62.7);Unsteadiness on feet (R26.81)     Time: 2778-2423 PT Time Calculation (min) (ACUTE ONLY): 30 min  Charges:  $Therapeutic Exercise: 23-37 mins                     Dylynn Ketner P, PT Acute Rehabilitation Services Pager: [phone] Office: [phone]    Kelsha Older B Colum Colt 10/21/2020, 10:42 AM

## 2020-10-21 NOTE — Progress Notes (Signed)
[place] for Heparin Indication: atrial fibrillation  Assessment: 69 y.o. female with a history of atrial fibrillation on chronic Eliquis prior to admission which has been held and patient transitioned to IV heparin infusion per pharmacy dosing consult.   Heparin level therapeutic at 0.48 on 1200 units/hr.  Hemoglobin low but stable. Platelets are within normal limits.  Remains on CRRT No bleeding issues reported.  Goal of Therapy:  Heparin level 0.3-0.5 units/ml Monitor platelets by anticoagulation protocol: Yes   Plan:  Continue heparin at 1200 units/hr Monitor daily heparin level and CBC, s/sx bleeding   Alanda Slim, PharmD, [facility] Clinical Pharmacist Please see AMION for all Pharmacists' Contact Phone Numbers 10/21/2020, 7:28 AM

## 2020-10-22 ENCOUNTER — Inpatient Hospital Stay (HOSPITAL_COMMUNITY): Payer: Medicare Other

## 2020-10-22 DIAGNOSIS — J9620 Acute and chronic respiratory failure, unspecified whether with hypoxia or hypercapnia: Secondary | ICD-10-CM

## 2020-10-22 LAB — CBC
HCT: 27 % — ABNORMAL LOW (ref 36.0–46.0)
HCT: 25.3 % — ABNORMAL LOW (ref 36.0–46.0)
Hemoglobin: 8.5 g/dL — ABNORMAL LOW (ref 12.0–15.0)
Hemoglobin: 7.6 g/dL — ABNORMAL LOW (ref 12.0–15.0)
MCH: 29.9 pg (ref 26.0–34.0)
MCH: 28.8 pg (ref 26.0–34.0)
MCHC: 31.5 g/dL (ref 30.0–36.0)
MCHC: 30 g/dL (ref 30.0–36.0)
MCV: 95.1 fL (ref 80.0–100.0)
MCV: 95.8 fL (ref 80.0–100.0)
Platelets: 320 10*3/uL (ref 150–400)
Platelets: 329 10*3/uL (ref 150–400)
RBC: 2.84 MIL/uL — ABNORMAL LOW (ref 3.87–5.11)
RBC: 2.64 MIL/uL — ABNORMAL LOW (ref 3.87–5.11)
RDW: 16.4 % — ABNORMAL HIGH (ref 11.5–15.5)
RDW: 16.6 % — ABNORMAL HIGH (ref 11.5–15.5)
WBC: 27.6 10*3/uL — ABNORMAL HIGH (ref 4.0–10.5)
WBC: 27.2 10*3/uL — ABNORMAL HIGH (ref 4.0–10.5)
nRBC: 2 % — ABNORMAL HIGH (ref 0.0–0.2)
nRBC: 3.4 % — ABNORMAL HIGH (ref 0.0–0.2)

## 2020-10-22 LAB — RENAL FUNCTION PANEL
Albumin: 2.4 g/dL — ABNORMAL LOW (ref 3.5–5.0)
Anion gap: 11 (ref 5–15)
BUN: 38 mg/dL — ABNORMAL HIGH (ref 8–23)
CO2: 24 mmol/L (ref 22–32)
Calcium: 11.5 mg/dL — ABNORMAL HIGH (ref 8.9–10.3)
Chloride: 96 mmol/L — ABNORMAL LOW (ref 98–111)
Creatinine, Ser: 0.47 mg/dL (ref 0.44–1.00)
GFR, Estimated: >60 mL/min (ref >60)
Glucose, Bld: 194 mg/dL — ABNORMAL HIGH (ref 70–99)
Phosphorus: 3.8 mg/dL (ref 2.5–4.6)
Potassium: 4.5 mmol/L (ref 3.5–5.1)
Sodium: 131 mmol/L — ABNORMAL LOW (ref 135–145)

## 2020-10-22 LAB — POCT I-STAT 7, (LYTES, BLD GAS, ICA,H+H)
Acid-Base Excess: 0 mmol/L (ref 0.0–2.0)
Bicarbonate: 24.4 mmol/L (ref 20.0–28.0)
Calcium, Ion: 1.7 mmol/L (ref 1.15–1.40)
HCT: 28 % — ABNORMAL LOW (ref 36.0–46.0)
Hemoglobin: 9.5 g/dL — ABNORMAL LOW (ref 12.0–15.0)
O2 Saturation: 93 %
Patient temperature: 99.8
Potassium: 5.2 mmol/L — ABNORMAL HIGH (ref 3.5–5.1)
Sodium: 132 mmol/L — ABNORMAL LOW (ref 135–145)
TCO2: 26 mmol/L (ref 22–32)
pCO2 arterial: 38.3 mmHg (ref 32.0–48.0)
pH, Arterial: 7.416 (ref 7.350–7.450)
pO2, Arterial: 67 mmHg — ABNORMAL LOW (ref 83.0–108.0)

## 2020-10-22 LAB — BASIC METABOLIC PANEL
Anion gap: 11 (ref 5–15)
BUN: 76 mg/dL — ABNORMAL HIGH (ref 8–23)
CO2: 22 mmol/L (ref 22–32)
Calcium: 12.6 mg/dL — ABNORMAL HIGH (ref 8.9–10.3)
Chloride: 96 mmol/L — ABNORMAL LOW (ref 98–111)
Creatinine, Ser: 0.81 mg/dL (ref 0.44–1.00)
GFR, Estimated: >60 mL/min (ref >60)
Glucose, Bld: 179 mg/dL — ABNORMAL HIGH (ref 70–99)
Potassium: 6 mmol/L — ABNORMAL HIGH (ref 3.5–5.1)
Sodium: 129 mmol/L — ABNORMAL LOW (ref 135–145)

## 2020-10-22 LAB — HEPARIN LEVEL (UNFRACTIONATED): Heparin Unfractionated: 0.37 IU/mL (ref 0.30–0.70)

## 2020-10-22 LAB — CK TOTAL AND CKMB (NOT AT ARMC)
CK, MB: 15.4 ng/mL — ABNORMAL HIGH (ref 0.5–5.0)
Relative Index: INVALID (ref 0.0–2.5)
Total CK: 26 U/L — ABNORMAL LOW (ref 38–234)

## 2020-10-22 LAB — GLUCOSE, CAPILLARY
Glucose-Capillary: 176 mg/dL — ABNORMAL HIGH (ref 70–99)
Glucose-Capillary: 187 mg/dL — ABNORMAL HIGH (ref 70–99)
Glucose-Capillary: 201 mg/dL — ABNORMAL HIGH (ref 70–99)
Glucose-Capillary: 341 mg/dL — ABNORMAL HIGH (ref 70–99)
Glucose-Capillary: 139 mg/dL — ABNORMAL HIGH (ref 70–99)
Glucose-Capillary: 219 mg/dL — ABNORMAL HIGH (ref 70–99)
Glucose-Capillary: 300 mg/dL — ABNORMAL HIGH (ref 70–99)

## 2020-10-22 LAB — MAGNESIUM: Magnesium: 2.6 mg/dL — ABNORMAL HIGH (ref 1.7–2.4)

## 2020-10-22 LAB — PROCALCITONIN: Procalcitonin: 4.96 ng/mL

## 2020-10-22 LAB — LACTIC ACID, PLASMA: Lactic Acid, Venous: 1.4 mmol/L (ref 0.5–1.9)

## 2020-10-22 MED ORDER — VANCOMYCIN HCL 750 MG/150ML IV SOLN
750.0000 mg | INTRAVENOUS | Status: DC
  Administered 2020-10-23 – 2020-10-24 (×2): 750 mg via INTRAVENOUS
  Filled 2020-10-22 (×2): qty 150

## 2020-10-22 MED ORDER — VANCOMYCIN VARIABLE DOSE PER UNSTABLE RENAL FUNCTION (PHARMACIST DOSING): Status: DC

## 2020-10-22 MED ORDER — SODIUM CHLORIDE 0.9 % IV BOLUS
250.0000 mL | Freq: Once | INTRAVENOUS | Status: AC
  Administered 2020-10-22: 06:00:00 250 mL via INTRAVENOUS

## 2020-10-22 MED ORDER — LACTATED RINGERS IV BOLUS
500.0000 mL | Freq: Once | INTRAVENOUS | Status: AC
  Administered 2020-10-22: 16:00:00 500 mL via INTRAVENOUS

## 2020-10-22 MED ORDER — PRISMASOL BGK 4/2.5 32-4-2.5 MEQ/L REPLACEMENT SOLN
Status: DC
  Filled 2020-10-22 (×12): qty 5000

## 2020-10-22 MED ORDER — PRISMASOL BGK 4/2.5 32-4-2.5 MEQ/L EC SOLN
Status: DC
  Filled 2020-10-22 (×20): qty 5000

## 2020-10-22 MED ORDER — HYDROMORPHONE HCL 1 MG/ML IJ SOLN
1.0000 mg | INTRAMUSCULAR | Status: DC
  Administered 2020-10-22 – 2020-11-05 (×79): 1 mg via INTRAVENOUS
  Filled 2020-10-22 (×78): qty 1

## 2020-10-22 MED ORDER — VANCOMYCIN HCL 1500 MG/300ML IV SOLN
1500.0000 mg | Freq: Once | INTRAVENOUS | Status: AC
  Administered 2020-10-22: 17:00:00 1500 mg via INTRAVENOUS
  Filled 2020-10-22: qty 300

## 2020-10-22 MED ORDER — HEPARIN SODIUM (PORCINE) 1000 UNIT/ML DIALYSIS
1000.0000 [IU] | INTRAMUSCULAR | Status: DC | PRN
  Filled 2020-10-22: qty 6
  Filled 2020-10-22: qty 4

## 2020-10-22 MED ORDER — PRISMASOL BGK 4/2.5 32-4-2.5 MEQ/L REPLACEMENT SOLN
Status: DC
  Filled 2020-10-22 (×30): qty 5000

## 2020-10-22 MED ORDER — DEXMEDETOMIDINE HCL IN NACL 400 MCG/100ML IV SOLN
0.4000 ug/kg/h | INTRAVENOUS | Status: DC
  Administered 2020-10-22: 15:00:00 0.6 ug/kg/h via INTRAVENOUS
  Administered 2020-10-22: 21:00:00 0.8 ug/kg/h via INTRAVENOUS
  Administered 2020-10-23: 06:00:00 0.5 ug/kg/h via INTRAVENOUS
  Administered 2020-10-23: 13:00:00 0.8 ug/kg/h via INTRAVENOUS
  Administered 2020-10-24 (×3): 1.1 ug/kg/h via INTRAVENOUS
  Filled 2020-10-22 (×2): qty 100
  Filled 2020-10-22: qty 200
  Filled 2020-10-22 (×5): qty 100

## 2020-10-22 MED ORDER — PIPERACILLIN-TAZOBACTAM 3.375 G IVPB 30 MIN
3.3750 g | Freq: Three times a day (TID) | INTRAVENOUS | Status: DC
  Administered 2020-10-23 (×2): 3.375 g via INTRAVENOUS
  Filled 2020-10-22 (×3): qty 50

## 2020-10-22 MED ORDER — ALBUMIN HUMAN 25 % IV SOLN
25.0000 g | Freq: Once | INTRAVENOUS | Status: AC
  Administered 2020-10-22: 04:00:00 25 g via INTRAVENOUS
  Filled 2020-10-22: qty 100

## 2020-10-22 MED ORDER — PIPERACILLIN-TAZOBACTAM IN DEX 2-0.25 GM/50ML IV SOLN
2.2500 g | Freq: Three times a day (TID) | INTRAVENOUS | Status: DC
  Administered 2020-10-22: 17:00:00 2.25 g via INTRAVENOUS
  Filled 2020-10-22 (×3): qty 50

## 2020-10-22 MED ORDER — HEPARIN (PORCINE) 2000 UNITS/L FOR CRRT
INTRAVENOUS_CENTRAL | Status: DC | PRN
  Filled 2020-10-22 (×2): qty 1000

## 2020-10-22 NOTE — Progress Notes (Signed)
Assumed care of pt to restart CRRT. Pts daughter, [name] called and updated on POC. All questions/ concerns answered at this time.

## 2020-10-22 NOTE — Progress Notes (Signed)
Pharmacy Antibiotic Note  Candace [name] is a 69 y.o. female admitted on 08/31/2020 with sepsis.  Pharmacy has been consulted for vancomycin and zosyn dosing. Pt has completed 2, 5-day courses of zosyn and has previously received vancomycin (dates below). However, pt has rising WBC of 27.6, remains afebrile, LA 1.4, and has been started on low dose norepineprhine and therefore pharmacy has been consulted to restart antibiotics.  Pt has been receiving CRRT and is now transitioning to iHD with first planned session on 10/24/2020.   Plan: Vancomycin 1523m IV x1 loading dose Vancomycin variable dosing to follow HD sessions Zosyn 2.25g IV q8h  Follow CBC, clinical progress, micro data, and HD plans  Height: _0  (172.7 cm) Weight: 75.1 kg (165 lb 9.1 oz) IBW/kg (Calculated) : 63.9  Temp (24hrs), Avg:98.1 F (36.7 C), Min:97.4 F (36.3 C), Max:98.9 F (37.2 C)  Recent Labs  Lab 10/19/20 0457 10/19/20 1551 10/19/20 1836 10/20/20 0350 10/20/20 1633 10/21/20 0308 10/21/20 1609 10/22/20 0329 10/22/20 1248  WBC 16.9*  --  16.1* 15.4*  --  20.4*  --  27.6*  --   CREATININE 0.49   < >  --  0.44 0.48 0.48 0.51 0.47  --   LATICACIDVEN  --   --  1.6  --   --   --   --   --  1.4   < > = values in this interval not displayed.    Estimated Creatinine Clearance: 67 mL/min (by C-G formula based on SCr of 0.47 mg/dL).    No Known Allergies  Antimicrobials this admission: LVQ 10/31 x1, Azith 11/2>>11/3, CTX 11/2 x1, Flucon 11/3 x1 Zosyn 11/3>11/7; 12/11>12/17 Vanc 11/10 x1; 11/24>11/30; 12/11>12/17 *11/27: VT 17 on 1g q24 and CRRT Cefepime 11/10 >>11/16; 11/24>11/26 Cefaz 11/26 x 1  Microbiology results: 11/10 MRSA PCR: neg 11/10 Bcx: neg 11/18 BAL: rare GPC, few candida alb - not treating per CCM 11/24 TA: MRSA 11/24 Bcx: 1/4 MRSE 12/1 Bcx: 1/4 MRSE 12/12 Bcx - neg  Thank you for allowing pharmacy to be a part of this patient's care.  MWilson Singer PharmD PGY1 Pharmacy  Resident 10/22/2020 3:31 PM

## 2020-10-22 NOTE — Progress Notes (Addendum)
E-Link notified of patients Potassium level of 6.0. See order.  Message also sent to on call nephrology Dr. Posey Pronto.

## 2020-10-22 NOTE — Progress Notes (Signed)
   Called to the bedside to talk to one of the daughters who is at the bedside and the other was on video camera.  They expressed concern that ongoing tachycardia and tachypnea throughout the day was related to stopping the CRRT.  I did explain to them after discussing with renal that we did not think it was related to the CRRT although the timing could have been coincidental.  Explained that patient was not in obvious pain or anxiety and that incipient sepsis was likely etiology and antibiotics have been started.  They did accept that response.  The bigger concern was that with patient having been here for a long time and being on opioids they were concerned the schedule oxycodone was inadequate and patient was experiencing "opioid withdrawal".  They felt oxycodone was insufficient.  They feel like scheduled fentanyl or Dilaudid would be more appropriate.  They did recognize that patient got Dilaudid 2 times today and they feel like this helped the patient improve the tachycardia and tachypnea.  We took a shared decision making to place the patient on scheduled Dilaudid through the course of the night and then reassess in the morning    SIGNATURE    Dr. Brand Males, M.D., F.C.C.P,  Pulmonary and Critical Care Medicine Staff Physician, [place] Director - Interstitial Lung Disease  Program  Pulmonary [place] at [place], [place], [address]  Pager: [phone], If no answer  OR between  19:00-7:00h: page [phone] Telephone (clinical office): [phone] Telephone (research): [phone]  6:56 PM 10/22/2020

## 2020-10-22 NOTE — Progress Notes (Signed)
[place] for Heparin Indication: atrial fibrillation  Assessment: 69 y.o. female with a history of atrial fibrillation on chronic Eliquis prior to admission which has been held and patient transitioned to IV heparin infusion per pharmacy dosing consult.   Heparin level therapeutic at 0.37 on 1200 units/hr.  Hemoglobin low but stable. Platelets are within normal limits.  Remains on CRRT No bleeding issues reported.  Goal of Therapy:  Heparin level 0.3-0.5 units/ml Monitor platelets by anticoagulation protocol: Yes   Plan:  Continue heparin at 1200 units/hr Monitor daily heparin level and CBC, s/sx bleeding   Alanda Slim, PharmD, [facility] Clinical Pharmacist Please see AMION for all Pharmacists' Contact Phone Numbers 10/22/2020, 7:17 AM

## 2020-10-22 NOTE — Progress Notes (Signed)
Pharmacy Antibiotic Note  Candace [name] is a 69 y.o. female admitted on 08/31/2020 with sepsis.  Pharmacy has been consulted for vancomycin and zosyn dosing. Pt has completed 2, 5-day courses of zosyn and has previously received vancomycin (dates below). However, pt has rising WBC of 27.6, remains afebrile, LA 1.4, and has been started on low dose norepineprhine and therefore pharmacy has been consulted to restart antibiotics.   Of note pt has been receiving CRRT which was stopped earlier today with plans to transition to [facility] but now resuming CRRT.   Plan: -Will start vancomycin 750 mg IV Q 24 hours while on CRRT -Zosyn 3.375 gm IV q 8 hours (30-minute infusion) -Monitor CBC, cultures, clinical progress   Height: _0  (172.7 cm) Weight: 75.1 kg (165 lb 9.1 oz) IBW/kg (Calculated) : 63.9  Temp (24hrs), Avg:99.7 F (37.6 C), Min:97.7 F (36.5 C), Max:102 F (38.9 C)  Recent Labs  Lab 10/19/20 1836 10/20/20 0350 10/20/20 1633 10/21/20 0308 10/21/20 1609 10/22/20 0329 10/22/20 1248 10/22/20 1736  WBC 16.1* 15.4*  --  20.4*  --  27.6*  --  27.2*  CREATININE  --  0.44 0.48 0.48 0.51 0.47  --  0.81  LATICACIDVEN 1.6  --   --   --   --   --  1.4  --     Estimated Creatinine Clearance: 66.1 mL/min (by C-G formula based on SCr of 0.81 mg/dL).    No Known Allergies  Antimicrobials this admission: LVQ 10/31 x1, Azith 11/2>>11/3, CTX 11/2 x1, Flucon 11/3 x1 Zosyn 11/3>11/7; 12/11>12/17 Vanc 11/10 x1; 11/24>11/30; 12/11>12/17 *11/27: VT 17 on 1g q24 and CRRT Cefepime 11/10 >>11/16; 11/24>11/26 Cefaz 11/26 x 1  Microbiology results: 11/10 MRSA PCR: neg 11/10 Bcx: neg 11/18 BAL: rare GPC, few candida alb - not treating per CCM 11/24 TA: MRSA 11/24 Bcx: 1/4 MRSE 12/1 Bcx: 1/4 MRSE 12/12 Bcx - neg  Thank you for allowing pharmacy to be a part of this patient's care.  Albertina Parr, PharmD., BCPS, BCCCP Clinical Pharmacist Please refer to [facility] for unit-specific pharmacist

## 2020-10-22 NOTE — Progress Notes (Signed)
Patient ID: Candace [name], female   DOB: 1951/07/18, 69 y.o.   MRN: [id number]    S:  on CRRT. Hemodynamically stable - no UOP- removed 516 with CRRT but given a bolus this AM for tachycardia -  Followed a command for me today    O:BP 133/84 (BP Location: Left Arm)   Pulse (!) 119   Temp 98.1 F (36.7 C) (Oral)   Resp 17   Ht 5' 8"  (1.727 m)   Wt 75.1 kg   SpO2 100%   BMI 25.17 kg/m   Intake/Output Summary (Last 24 hours) at 10/22/2020 0917 Last data filed at 10/22/2020 0900 Gross per 24 hour  Intake 3451.61 ml  Output 3984 ml  Net -532.39 ml   Intake/Output: I/O last 3 completed shifts: In: 4782.9 [I.V.:901.2; NG/GT:3320; IV Piggyback:561.7] Out: 6759 [Emesis/NG output:175; FMBWG:6659; Stool:1215]  Intake/Output this shift:  Total I/O In: 278.1 [I.V.:38.1; NG/GT:240] Out: 354 [Other:354] Weight change: -2 kg Gen: on vent via trach, flat affect- no commands  CVS: tachy at 119 Resp: scattered rhonchi Abd: +BS, soft, ostomy in RLQ Ext: trace dependent edema  Recent Labs  Lab 10/19/20 0457 10/19/20 1551 10/19/20 1836 10/20/20 0350 10/20/20 1633 10/21/20 0308 10/21/20 1609 10/22/20 0329  NA 132* 131*  --  132* 133* 132* 130* 131*  K 4.3 4.3  --  4.4 4.6 4.9 4.7 4.5  CL 100 97*  --  98 99 98 96* 96*  CO2 24 24  --  25 26 25  21* 24  GLUCOSE 145* 212*  --  161* 169* 241* 364* 194*  BUN 38* 45*  --  37* 40* 39* 40* 38*  CREATININE 0.49 0.45  --  0.44 0.48 0.48 0.51 0.47  ALBUMIN 2.3* 2.4* 2.4* 2.3* 2.4* 2.4* 2.4* 2.4*  CALCIUM 10.0 10.1  --  10.1 10.3 10.6* 11.2* 11.5*  PHOS 2.7 3.1  --  2.8 2.8 2.9 4.8* 3.8  AST  --   --  74*  --   --   --   --   --   ALT  --   --  85*  --   --   --   --   --    Liver Function Tests: Recent Labs  Lab 10/19/20 1836 10/20/20 0350 10/21/20 0308 10/21/20 1609 10/22/20 0329  AST 74*  --   --   --   --   ALT 85*  --   --   --   --   ALKPHOS 332*  --   --   --   --   BILITOT 0.6  --   --   --   --   PROT 7.4  --   --   --   --    ALBUMIN 2.4*   < > 2.4* 2.4* 2.4*   < > = values in this interval not displayed.   No results for input(s): LIPASE, AMYLASE in the last 168 hours. No results for input(s): AMMONIA in the last 168 hours. CBC: Recent Labs  Lab 10/19/20 0457 10/19/20 1836 10/20/20 0350 10/21/20 0308 10/22/20 0329  WBC 16.9* 16.1* 15.4* 20.4* 27.6*  HGB 7.9* 8.0* 7.7* 8.1* 8.5*  HCT 24.6* 26.7* 26.4* 27.4* 27.0*  MCV 93.5 96.4 96.7 96.8 95.1  PLT 261 288 294 323 320   Cardiac Enzymes: No results for input(s): CKTOTAL, CKMB, CKMBINDEX, TROPONINI in the last 168 hours. CBG: Recent Labs  Lab 10/21/20 1611 10/21/20 1922 10/21/20 2321 10/22/20 0323 10/22/20 0745  GLUCAP 336* 279* 224* 176* 187*    Iron Studies: No results for input(s): IRON, TIBC, TRANSFERRIN, FERRITIN in the last 72 hours. Studies/Results: No results found. . B-complex with vitamin C  1 tablet Per Tube Daily  . chlorhexidine gluconate (MEDLINE KIT)  15 mL Mouth Rinse BID  . Chlorhexidine Gluconate Cloth  6 each Topical Daily  . cholecalciferol  2,000 Units Per Tube Daily  . clonazepam  1 mg Per Tube BID  . collagenase   Topical Daily  . [START ON 10/26/2020] darbepoetin (ARANESP) injection - DIALYSIS  100 mcg Subcutaneous Q Tue-1800  . docusate  100 mg Per Tube BID  . feeding supplement (PROSource TF)  90 mL Per Tube TID  . guaiFENesin  15 mL Per Tube Q6H  . insulin aspart  0-15 Units Subcutaneous Q4H  . insulin aspart  7 Units Subcutaneous Q4H  . insulin glargine  40 Units Subcutaneous Daily  . mouth rinse  15 mL Mouth Rinse QID  . midodrine  20 mg Per Tube TID WC  . nutrition supplement (JUVEN)  1 packet Per Tube BID BM  . oxyCODONE  5 mg Per Tube Q4H  . pantoprazole sodium  40 mg Per Tube BID  . polyethylene glycol  17 g Per Tube Daily  . QUEtiapine  100 mg Per Tube BID  . sodium chloride flush  10-40 mL Intracatheter Q12H    BMET    Component Value Date/Time   NA 131 (L) 10/22/2020 0329   K 4.5 10/22/2020  0329   CL 96 (L) 10/22/2020 0329   CO2 24 10/22/2020 0329   GLUCOSE 194 (H) 10/22/2020 0329   BUN 38 (H) 10/22/2020 0329   CREATININE 0.47 10/22/2020 0329   CALCIUM 11.5 (H) 10/22/2020 0329   GFRNONAA >60 10/22/2020 0329   CBC    Component Value Date/Time   WBC 27.6 (H) 10/22/2020 0329   RBC 2.84 (L) 10/22/2020 0329   HGB 8.5 (L) 10/22/2020 0329   HCT 27.0 (L) 10/22/2020 0329   PLT 320 10/22/2020 0329   MCV 95.1 10/22/2020 0329   MCH 29.9 10/22/2020 0329   MCHC 31.5 10/22/2020 0329   RDW 16.4 (H) 10/22/2020 0329   LYMPHSABS 0.4 (L) 10/04/2020 0516   MONOABS 0.2 10/04/2020 0516   EOSABS 0.0 10/04/2020 0516   BASOSABS 0.0 10/04/2020 0516     Assessment/Plan:  1. AKI- oliguric presumably due to ischemic ATN in setting of hemorrhagic shock. Started on CRRT 09/08/20 due to persistent oliguria and hyperkalemia. 1. All fluids 4K/2.5Ca: Pre-filter to 500 ml/hr, post-filter 300 ml/hr, and dialysate to 1500 ml/hr.  2. Will continue UF  50 ml/hr given rhonchi, edema, and difficulty with weaning. 3. No anticoagulation due to thrombocytopenia and ongoing GI blood loss. 4. Continue with current CRRT settings. Pt has deteriorated several times when trying to transition to IHD but has been a week since we last tried-  Will plan to stop CRRT today-  Then attempt IHD on Sunday 12/26 in ICU  5. RIJ TDC placed 10/19/20 by IR 2. Hemorrhagic shock- ongoing blood loss and requiring pressors. 1. Transfuse as needed-  Have added ESA  3. Septic shock due to MRSA pneumonia. 4. Perforated duodenal ulcer- s/p exploratory lap and Graham patch placement per surgery. 5. Acute hypoxic/hypercapnic respiratory failure due to ARDS from Recent Covid-19 PNA- currently on vent via trach per PCCM. low o2 6. P. Atrial fibrillation- no anticoagulation due to GI bleed. 7. Hyponatremia- chronic and stable   8.  Severe protein malnutrition- per primary svc. 9. Disposition- poor overall prognosis and will  likely require LTAC and chronic vent support. PCCM to have ongoing goals of care meetings with family.   Louis Meckel  [employer] [phone]

## 2020-10-22 NOTE — Progress Notes (Signed)
NAMEMaryellen [name], MRN:  [id number], DOB:  11-Jul-1951, LOS: 7 ADMISSION DATE:  08/31/2020, CONSULTATION DATE:  09/07/2020 REFERRING MD:  Dr Candace [name], CHIEF COMPLAINT:  Acute resp failure  Brief History   69 year old female who was previously diagnosed with Covid 08/23/2020.  Admitted 11/2 with AF-RVR, found to have a perforated duodenal underwent exploratory laparotomy with Candace [name] patch placement.  Past Medical History  Covid pneumonia Atrial fibrillation CKD stage III Diabetes mellitus Hypertension Colon cancer Hyperlipidemia  Significant Hospital Events   11/2 Admitted  11/3 OR with findings of perforated duodenal ulcer 11/10 progressive hemorrhagic shock, intubated, transfused, pressors, proned; started on CRRT in PM 11/03 Exploratory laparotomy, Candace [name] patch, lysis of adhesion for duodenal ulceration postop day 6 11/16 Extubated. Re-intubated overnight due to respiratory distress and hypoxia with decreased mentation 11/18 Bronch, cultures sent 11/19 Hgb down getting blood 11/24 Spiked fever resume empirical antimicrobial therapy 11/26 Hemorrhagic shock, hgb 5.6, increased pressors, CT A/P  11/30 Per palliative "Candace [name] expresses understanding that patient is unfortunately very fragile despite ongoing intensive medical treatment and full mechanical support. She indicates that the family wants to continue with all current interventions despite potential outcomes". 12/08 CRRT discontinued due to clotting 12/09 Family requested transfer to tertiary care [facility] denied transfer  12/10 CRRT restarted. Episodes of tachycardia, tachypnea that seem to improve with pain management 12/11 Back in shock. Pressor requirements up. CXR worse. ABX resumed 12/12 Still requiring inc pressors. Had hypoglycemic event. Basal insulin dosing adjusted 12/13 Pressor requirements better. Now hyperglycemic. Re-adjusted Glycemic control  12/14 Changed dilaudid to1/2 dosing from day further. D/c  vasopressin.  Goals of care reconfirmed with daughter.  Patient continues to desire aggressive care.  Not open to discussing any other option, patient family continues to be hopeful that she will be discharged to home with full recovery in spite of multiple attempts by staff to prepare them that this is unlikely scenario 12/15 Dilaudid discontinued sending cortisol for ongoing pressor dependence 12/16 Serum cortisol <20, added stress dose steroids.  PRN Dilaudid, attempting not go back on Dilaudid infusion 12/17 Developed worsening tachycardia during the evening hours received initially IV albumin, followed by resuming IV Dilaudid with question of suboptimal pain control.  Currently looks better back on Dilaudid drip.  We have been able to wean pressors after adding stress dose steroids; near arrest - bradycardia, better with atropine  12/19 Afebrile . Remains on dilaudid and heparin gtt, dilaudid gtt increased overnight for concern of pain / ongoing tachycardia, no other events . NE and precedex off 12/17. Ongoing CRRT- even UF, dosing lokelmia this morning 12/20 On CRRT.  Renal plans for HD cath and moving to HD. Getting wound care  12/23 -No vent weaning per RT, remains on full support, #8 trach in place but previously had #6, no documentation of change noted in chart Afebrile / WBC 20.4  Vent - 30% FiO2, PEEP 5 Glucose range 176-212 I/O 465 ml stool, 3.6L removed with HD, -1.1L in last 24 hours  RN reports ongoing periods of tachypnea / vent dyssynchrony that responds to dilaudid   Consults:  Cardiology [facility] Nephrology   Procedures:  R PICC 11/5 >> A line 11/9 >> out ETT 11/9 > 11/16, 11/16 >> 09/21/2020 09/21/2020 tracheostomy>> Lt Candace [name] CVL 11/9 >> R IJ trialysis >> out HD catheter 12/1 >>12/20 12/21 - 14.5 Fr, 23 cm right IJ tunneled hemodialysis catheter placement.  Removal of indwelling subclavian catheter.   Significant Diagnostic Tests:  11/3 CT abd/ pelvis >  1. Positive for  bowel perforation: Pneumoperitoneum and intermediate density free fluid in the abdomen. Prior total colectomy. The specific site of perforation is unclear-oral contrast present to the proximal jejunum has not obviously leaked. Note that there may be small bowel loops adherent to the ventral abdominal wall along the greater curve of the stomach. 2. Extensive bilateral lower lung pneumonia. No pleural effusion. 3. Other abdominal and pelvic viscera are stable since 2015, including bilateral adrenal adenomas. Chronic renal parapelvic cysts. 4. Aortic Atherosclerosis 11/3 TTE > EF 70-75%, RV not well visualized, mildly reduced RV systolic function 89/38 CT chest/ abd/ pelvis> 1. Interval progression of diffuse bilateral hazy ground-glass airspace opacities with more focal areas of consolidation at the lung bases 2. Trace bilateral pleural effusions. 3. Postsurgical changes the abdomen as detailed above. No evidence for a postoperative abscess, however evaluation is limited by lack of IV contrast. 4. There is a 1.9 cm cystic appearing lesion located in the pancreatic body. This was not present on the patient's CT from 2015.  Follow-up with an outpatient contrast enhanced MRI is recommended. 5. The endometrial stripe appears diffusely thickened. Follow-up with pelvic ultrasound is recommended. Aortic Atherosclerosis 11/14 LE doppler studies > + DVT of right posterior tibial and peroneal vein, +dVT of left posterior tibial vein  11/26 CT ABD PEL > liver unremarkable, distended gallbladder with layering tiny gallstones versus sludge, no duct dilatation, mild hyperdensity right upper pole renal collecting system new.  No evidence of retroperitoneal bleeding.  Multifocal lower lobe predominant pulmonary infiltrates/pneumonia.  Small left pleural effusion.  Micro Data:  11/10 MRSA PCR > neg 11/10 BC x 2 > neg 09/22/2020 blood cultures x2>> S epi.  09/22/2020 sputum culture>> MRSA Blood 12/1 >> negative.  S.epi 1 out of 2, likely contaminant BCx 2 12/12 >> neg  Antimicrobials:  azithro 11/2 >11/3 Ceftriaxone 11/2  Fluconazole 11/3 Zosyn 11/3 >> 11/7 Vanc 11/10 off Cefepime 11/10 > 11/16 09/22/2020 vancomycin for MRSA PNA >> 12/2  Vanc 12/11> 12/17 Zosyn 12/11> 12/17  Subjective:    10/22/2020  - renal stopping CRRT today and plans HD 10/24/20 . 40% fio2 on vent via Trach. TAchypenic and tachycardic. Afebrile but wbc up to 27.6K  On TF On heparin gtt  Objective   Blood pressure 133/84, pulse (!) 119, temperature 98.1 F (36.7 C), temperature source Oral, resp. rate 17, height 5\' 8"  (1.727 m), weight 75.1 kg, SpO2 100 %. CVP:  [0 mmHg] 0 mmHg  Vent Mode: PRVC FiO2 (%):  [40 %] 40 % Set Rate:  [28 bmp] 28 bmp Vt Set:  [450 mL] 450 mL PEEP:  [5 cmH20] 5 cmH20 Plateau Pressure:  [21 cmH20-31 cmH20] 31 cmH20   Intake/Output Summary (Last 24 hours) at 10/22/2020 1220 Last data filed at 10/22/2020 1149 Gross per 24 hour  Intake 3456.64 ml  Output 3857 ml  Net -400.36 ml   Filed Weights   10/20/20 0430 10/21/20 0600 10/22/20 0500  Weight: 79.5 kg 77.1 kg 75.1 kg   Physical Exam: General Appearance:  Looks chronic criticall ill OBESE - + Head:  Normocephalic, without obvious abnormality, atraumatic Eyes:  PERRL - yes, conjunctiva/corneas - muddy     Ears:  Normal external ear canals, both ears Nose:  G tube - yes Throat:  ETT TUBE - no , OG tube - no. TRACH + Neck:  Supple,  No enlargement/tenderness/nodules Lungs: Clear to auscultation bilaterally, Ventilator   Synchrony - yes but tachyepnic Heart:  S1 and S2 normal, no  murmur, CVP - x.  Pressors - no Abdomen:  Soft, no masses, no organomegaly Genitalia / Rectal:  Not done Extremities:  Extremities- intact Skin:  ntact in exposed areas . Sacral area - x Neurologic:  Sedation - none -> RASS - -3     Resolved problems:  Previously hemorrhagic shock requiring PRBCs, Septic shock, MRSA pneumonia s/p 8 days  abx Recurrent septic shock 12/11 -working dx recurrent HCAP c/b adrenal insuff - Completed 7 days of vanc and zosyn 12/17. Culture negative. 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  Has not been able to make progress with vent weaning, remains  10/22/2020 - still on PRVC. Unable to wean. Very tachycadic and tachypneic  PLAn -PRVC / lung protective ventilation  -continue SBT trials, may need to accept a degree of tachypnea  -follow intermittent CXR -re-culture tracheal aspirate if fever >101.5  -VAP prevention measures  Ongoing leukocytosis   10/22/2020 - going up  plan -trend WBC -most recent cultures 12/12 and negative  -would not reculture unless fever >101.5   SIRS   10/22/2020  - rising WBC , Tachypneic, Tachycardic  Plan  - check lactate - check PCT - check CK - check EKG  Acute metabolic encephalopathy in the setting of renal failure, shock, sepsis  - 10/22/2020  Followed a command for renal  Plan continue PT oxycodone, klonopin, seroquel  -PT efforts as able  -follow neuro exam   Oliguric / Anuric AKI Hyperkalemia  Nephrology consulted. Deemed NOT long term HD candidate.  CRRT started on 11/10; held 12/8; resumed on 12/10  12/24 - CRRT stopped with plan sfor iHD 12/26  plan -appreciate Nephrology    Duodenal ulcer perforation status post ex lap and Graham's patch  10/22/2020 - No active bleed  plan Appreciate CTS assistance and management -wound care  -Follow colostomy  -hydrotherapy per WOC   Paroxysmal A. fib, currently in sinus rhythm  10/22/2020 - HR 140s   Plan  - check EKG -heparin gtt per pharmacy  -follow CBC    Indeterminate bilateral DVT -IV heparin per pharmacy   Anemia of critical illness  10/22/2020 - no active bleed  Plan  - - PRBC for  hgb </= 6.9gm%    - exceptions are   -  if ACS susepcted/confirmed then transfuse for hgb </= 8.0gm%,  or    -  active bleeding with hemodynamic instability, then transfuse regardless of hemoglobin value   At at all times try to transfuse 1 unit prbc as possible with exception of active hemorrhage    Diabetes type 2 w/ marked Hyperglycemia -CBG goal 140-180 -SSI, lantus, TF coverage -improved off steroids  History of stage IV colon cancer Status post pulmonary [facility]ct[facility] + Chemo 1.9 cm cystic appearing lesion located in the pancreatic body -will need ONC follow up if survives critical illness   Best practice:  Diet: TF Pain/Anxiety/Delirium protocol (if indicated) dilaudid gtt/ enteral sedation-  clonazepam, oxy, seroquel VAP protocol (if indicated): yes DVT prophylaxis: IV heparin GI prophylaxis: Protonix Glucose control: SSI, TF coverage, lantus Mobility: Bedrest  Goals of Care:  Last date of multidisciplinary goals of care discussion:12/14 Family and staff present:  nurse practitioner, bedside nurse, and the patient's daughter Summary of discussion:  There have been no changes in goals of care, family continues to desire any and all  therapies, remains full code, family remains hopeful that she will survive and be discharged to home Follow up goals of care discussion due: no change in goals of care.   Code Status: Full  Updated 12/24 -> Daghter Candace [name] update over phone   [place]   The patient Candace [name] is critically ill with multiple organ systems failure and requires high complexity decision making for assessment and support, frequent evaluation and titration of therapies, application of advanced monitoring technologies and extensive interpretation of multiple databases.   Critical Care Time devoted to patient care services described in this note is  35  Minutes. This time reflects time of care of this signee Dr Brand Males. This  critical care time does not reflect procedure time, or teaching time or supervisory time of PA/NP/Med student/Med Resident etc but could involve care discussion time     Dr. Brand Males, M.D., [facility]C.P Pulmonary and Critical Care Medicine Staff Physician [facility] Pager: [phone], If no answer or between  15:00h - 7:00h: call [phone]  10/22/2020 12:21 PM     LABS    PULMONARY Recent Labs  Lab 10/18/20 0507  PHART 7.387  PCO2ART 42.4  PO2ART 105  HCO3 24.9  O2SAT 98.4    CBC Recent Labs  Lab 10/20/20 0350 10/21/20 0308 10/22/20 0329  HGB 7.7* 8.1* 8.5*  HCT 26.4* 27.4* 27.0*  WBC 15.4* 20.4* 27.6*  PLT 294 323 320    COAGULATION No results for input(s): INR in the last 168 hours.  CARDIAC  No results for input(s): TROPONINI in the last 168 hours. No results for input(s): PROBNP in the last 168 hours.   CHEMISTRY Recent Labs  Lab 10/18/20 0248 10/18/20 1605 10/19/20 0457 10/19/20 1551 10/20/20 0350 10/20/20 1633 10/21/20 0308 10/21/20 1609 10/22/20 0329  NA 133*   < > 132*   < > 132* 133* 132* 130* 131*  K 5.3*   < > 4.3   < > 4.4 4.6 4.9 4.7 4.5  CL 98   < > 100   < > 98 99 98 96* 96*  CO2 23   < > 24   < > 25 26 25  21* 24  GLUCOSE 144*   < > 145*   < > 161* 169* 241* 364* 194*  BUN 50*   < > 38*   < > 37* 40* 39* 40* 38*  CREATININE 0.57   < > 0.49   < > 0.44 0.48 0.48 0.51 0.47  CALCIUM 10.2   < > 10.0   < > 10.1 10.3 10.6* 11.2* 11.5*  MG 2.4  --  2.4  --  2.8*  --  2.5*  --  2.6*  PHOS 2.9   < > 2.7   < > 2.8 2.8 2.9 4.8* 3.8   < > = values in this interval not displayed.   Estimated Creatinine Clearance: 67 mL/min (by C-G formula based on SCr of 0.47 mg/dL).   LIVER Recent Labs  Lab 10/19/20 1836 10/20/20 0350 10/20/20 1633 10/21/20 0308 10/21/20 1609 10/22/20 0329  AST 74*  --   --   --   --   --   ALT 85*  --   --   --   --   --   ALKPHOS 332*  --   --   --   --    --   BILITOT 0.6  --   --   --   --   --  PROT 7.4  --   --   --   --   --   ALBUMIN 2.4* 2.3* 2.4* 2.4* 2.4* 2.4*     INFECTIOUS Recent Labs  Lab 10/19/20 1836  LATICACIDVEN 1.6     ENDOCRINE CBG (last 3)  Recent Labs    10/22/20 0323 10/22/20 0745 10/22/20 1142  GLUCAP 176* 187* 341*

## 2020-10-22 NOTE — Progress Notes (Addendum)
eLink Physician-Brief Progress Note Patient Name: Caylei Sperry DOB: Jan 24, 1951 MRN: [id number]   Date of Service  10/22/2020  HPI/Events of Note  Patient in sinus tachycardia with a rate of 130. CVP 0. Patient is on CRRT and being run even.  eICU Interventions  Albumin 25 % 25 gm iv x 1 ordered.        Kerry Kass Jimmi Sidener 10/22/2020, 3:28 AM

## 2020-10-22 NOTE — Progress Notes (Signed)
eLink Physician-Brief Progress Note Patient Name: Candace [name] DOB: Nov 02, 1950 MRN: [id number]   Date of Service  10/22/2020  HPI/Events of Note  Patient received 25 gm of 25 % Albumin but is still tachycardic with a heart rate of 128.  eICU Interventions  NS bolus of 250 ml iv x 1 ordered.        Kerry Kass Nicki Furlan 10/22/2020, 5:28 AM

## 2020-10-22 NOTE — Progress Notes (Signed)
Called to reassess due to worsening SIRS - HR 140s - EKG and RR 30s-50s On levophed after dc of CRRT - 59mcg  Lactate ok PCT 4s CK  - normal   ABG -  PH ok but K rising and Calcium high   A ? Septic >? instabilty once CRRT off  Plan  - d/w Dr Clover Mealy of renal ->  dobut instability due to lack of CRRT  - will fluid bolus 500cc - start abx - start precedex > if needed and this wont wok -> fent/versed  =- rechjeck cbc and bmet at 5.30pm   Additiona, 20 min cm time   SIGNATURE    Dr. Brand Males, M.D., F.C.C.P,  Pulmonary and Critical Care Medicine Staff Physician, [place] Director - Interstitial Lung Disease  Program  [facility] [place], [place], [address]  Pager: [phone], If no answer  OR between  19:00-7:00h: page [phone] Telephone (clinical office): [phone] Telephone (research): [phone]  3:17 PM 10/22/2020     LABS    PULMONARY Recent Labs  Lab 10/18/20 0507 10/22/20 1441  PHART 7.387 7.416  PCO2ART 42.4 38.3  PO2ART 105 67*  HCO3 24.9 24.4  TCO2  --  26  O2SAT 98.4 93.0    CBC Recent Labs  Lab 10/20/20 0350 10/21/20 0308 10/22/20 0329 10/22/20 1441  HGB 7.7* 8.1* 8.5* 9.5*  HCT 26.4* 27.4* 27.0* 28.0*  WBC 15.4* 20.4* 27.6*  --   PLT 294 323 320  --     COAGULATION No results for input(s): INR in the last 168 hours.  CARDIAC  No results for input(s): TROPONINI in the last 168 hours. No results for input(s): PROBNP in the last 168 hours.   CHEMISTRY Recent Labs  Lab 10/18/20 0248 10/18/20 1605 10/19/20 0457 10/19/20 1551 10/20/20 0350 10/20/20 1633 10/21/20 0308 10/21/20 1609 10/22/20 0329 10/22/20 1441  NA 133*   < > 132*   < > 132* 133* 132* 130* 131* 132*  K 5.3*   < > 4.3   < > 4.4 4.6 4.9 4.7 4.5 5.2*  CL 98   < > 100   < > 98 99 98 96* 96*  --   CO2 23   < > 24   < > 25 26 25  21* 24  --   GLUCOSE 144*   < >  145*   < > 161* 169* 241* 364* 194*  --   BUN 50*   < > 38*   < > 37* 40* 39* 40* 38*  --   CREATININE 0.57   < > 0.49   < > 0.44 0.48 0.48 0.51 0.47  --   CALCIUM 10.2   < > 10.0   < > 10.1 10.3 10.6* 11.2* 11.5*  --   MG 2.4  --  2.4  --  2.8*  --  2.5*  --  2.6*  --   PHOS 2.9   < > 2.7   < > 2.8 2.8 2.9 4.8* 3.8  --    < > = values in this interval not displayed.   Estimated Creatinine Clearance: 67 mL/min (by C-G formula based on SCr of 0.47 mg/dL).   LIVER Recent Labs  Lab 10/19/20 1836 10/20/20 0350 10/20/20 1633 10/21/20 0308 10/21/20 1609 10/22/20 0329  AST 74*  --   --   --   --   --  ALT 85*  --   --   --   --   --   ALKPHOS 332*  --   --   --   --   --   BILITOT 0.6  --   --   --   --   --   PROT 7.4  --   --   --   --   --   ALBUMIN 2.4* 2.3* 2.4* 2.4* 2.4* 2.4*     INFECTIOUS Recent Labs  Lab 10/19/20 1836 10/22/20 1248  LATICACIDVEN 1.6 1.4  PROCALCITON  --  4.96     ENDOCRINE CBG (last 3)  Recent Labs    10/22/20 0745 10/22/20 1142 10/22/20 1214  GLUCAP 187* 341* 201*         IMAGING x48h  - image(s) personally visualized  -   highlighted in bold No results found.

## 2020-10-22 NOTE — Progress Notes (Signed)
Physical Therapy Wound Treatment Patient Details  Name: Candace [name] MRN: [id number] Date of Birth: [birth year]  Today's Date: 10/22/2020 Time: 8768-1157 Time Calculation (min): 31 min  Subjective  Subjective: Lethargic on vent with intermittent eyes open. Patient and Family Stated Goals: Heal wound; maintain pt comfort per daughter Date of Onset:  (Unknown)  Pain Score:  Pt premedicated and appeared to tolerate treatment well without facial grimace.   Wound Assessment  Pressure Injury 09/25/20 Sacrum Bilateral;Medial Deep Tissue Pressure Injury - Purple or maroon localized area of discolored intact skin or blood-filled blister due to damage of underlying soft tissue from pressure and/or shear. Purple, non-blanchable, b (Active)  Dressing Type ABD;Barrier Film (skin prep);Gauze (Comment) 10/22/20 0947  Dressing Changed;Clean;Dry;Intact 10/22/20 0947  Dressing Change Frequency Daily 10/22/20 0947  State of Healing Eschar 10/22/20 0947  Site / Wound Assessment Pink;Yellow;Black;Brown;Dusky 10/22/20 0947  % Wound base Red or Granulating 20% 10/22/20 0947  % Wound base Yellow/Fibrinous Exudate 60% 10/22/20 0947  % Wound base Black/Eschar 20% 10/22/20 0947  % Wound base Other/Granulation Tissue (Comment) 0% 10/22/20 0947  Peri-wound Assessment Excoriated 10/22/20 0947  Wound Length (cm) 16 cm 10/20/20 0941  Wound Width (cm) 11 cm 10/20/20 0941  Wound Depth (cm) 4 cm 10/20/20 0941  Wound Surface Area (cm^2) 176 cm^2 10/20/20 0941  Wound Volume (cm^3) 704 cm^3 10/20/20 0941  Tunneling (cm) 0 10/13/20 1200  Undermining (cm) 0 10/13/20 1200  Margins Unattached edges (unapproximated) 10/22/20 0947  Drainage Amount Minimal 10/22/20 0947  Drainage Description Serosanguineous 10/22/20 0947  Treatment Debridement (Selective);Hydrotherapy (Pulse lavage);Packing (Saline gauze) 10/22/20 0947   Santyl applied to wound bed prior to applying dressing.     Hydrotherapy Pulsed lavage therapy -  wound location: sacrum Pulsed Lavage with Suction (psi): 12 psi Pulsed Lavage with Suction - Normal Saline Used: 1000 mL Pulsed Lavage Tip: Tip with splash shield Selective Debridement Selective Debridement - Location: sacrum Selective Debridement - Tools Used: Forceps;Scalpel Selective Debridement - Tissue Removed: eschar; non-viable tissue   Wound Assessment and Plan  Wound Therapy - Assess/Plan/Recommendations Wound Therapy - Clinical Statement: Wound appears to be largely necrotic adipose but overall progressing with debridement. Will continue to follow for selective removal of unviable tissue, to decrease bioburden, and promote wound bed healing. Wound Therapy - Functional Problem List: Global weakness and immobility Factors Delaying/Impairing Wound Healing: Diabetes Mellitus;Immobility;Multiple medical problems Hydrotherapy Plan: Debridement;Dressing change;Patient/family education;Pulsatile lavage with suction Wound Therapy - Frequency: 6X / week Wound Therapy - Follow Up Recommendations: Skilled nursing facility Wound Plan: See above  Wound Therapy Goals- Improve the function of patient's integumentary system by progressing the wound(s) through the phases of wound healing (inflammation - proliferation - remodeling) by: Decrease Necrotic Tissue to: 20% Decrease Necrotic Tissue - Progress: Progressing toward goal Increase Granulation Tissue to: 80% Increase Granulation Tissue - Progress: Progressing toward goal Goals/treatment plan/discharge plan were made with and agreed upon by patient/family: No, Patient unable to participate in goals/treatment/discharge plan and family unavailable Time For Goal Achievement: 7 days Wound Therapy - Potential for Goals: Fair  Goals will be updated until maximal potential achieved or discharge criteria met.  Discharge criteria: when goals achieved, discharge from hospital, MD decision/surgical intervention, no progress towards goals,  refusal/missing three consecutive treatments without notification or medical reason.  GP     Thelma Comp 10/22/2020, 10:00 AM   Rolinda Roan, PT, DPT Acute Rehabilitation Services Pager: [phone] Office: [phone]

## 2020-10-23 DIAGNOSIS — J9620 Acute and chronic respiratory failure, unspecified whether with hypoxia or hypercapnia: Secondary | ICD-10-CM | POA: Diagnosis not present

## 2020-10-23 LAB — CBC
HCT: 24.3 % — ABNORMAL LOW (ref 36.0–46.0)
Hemoglobin: 7.3 g/dL — ABNORMAL LOW (ref 12.0–15.0)
MCH: 28.9 pg (ref 26.0–34.0)
MCHC: 30 g/dL (ref 30.0–36.0)
MCV: 96 fL (ref 80.0–100.0)
Platelets: 314 10*3/uL (ref 150–400)
RBC: 2.53 MIL/uL — ABNORMAL LOW (ref 3.87–5.11)
RDW: 16.3 % — ABNORMAL HIGH (ref 11.5–15.5)
WBC: 28.5 10*3/uL — ABNORMAL HIGH (ref 4.0–10.5)
nRBC: 1.7 % — ABNORMAL HIGH (ref 0.0–0.2)

## 2020-10-23 LAB — RENAL FUNCTION PANEL
Albumin: 2.4 g/dL — ABNORMAL LOW (ref 3.5–5.0)
Albumin: 2.3 g/dL — ABNORMAL LOW (ref 3.5–5.0)
Anion gap: 11 (ref 5–15)
Anion gap: 11 (ref 5–15)
BUN: 72 mg/dL — ABNORMAL HIGH (ref 8–23)
BUN: 67 mg/dL — ABNORMAL HIGH (ref 8–23)
CO2: 22 mmol/L (ref 22–32)
CO2: 22 mmol/L (ref 22–32)
Calcium: 11.1 mg/dL — ABNORMAL HIGH (ref 8.9–10.3)
Calcium: 10.9 mg/dL — ABNORMAL HIGH (ref 8.9–10.3)
Chloride: 99 mmol/L (ref 98–111)
Chloride: 98 mmol/L (ref 98–111)
Creatinine, Ser: 0.79 mg/dL (ref 0.44–1.00)
Creatinine, Ser: 0.71 mg/dL (ref 0.44–1.00)
GFR, Estimated: >60 mL/min (ref >60)
GFR, Estimated: >60 mL/min (ref >60)
Glucose, Bld: 161 mg/dL — ABNORMAL HIGH (ref 70–99)
Glucose, Bld: 242 mg/dL — ABNORMAL HIGH (ref 70–99)
Phosphorus: 4.9 mg/dL — ABNORMAL HIGH (ref 2.5–4.6)
Phosphorus: 4.6 mg/dL (ref 2.5–4.6)
Potassium: 5.2 mmol/L — ABNORMAL HIGH (ref 3.5–5.1)
Potassium: 5.2 mmol/L — ABNORMAL HIGH (ref 3.5–5.1)
Sodium: 132 mmol/L — ABNORMAL LOW (ref 135–145)
Sodium: 131 mmol/L — ABNORMAL LOW (ref 135–145)

## 2020-10-23 LAB — GLUCOSE, CAPILLARY
Glucose-Capillary: 119 mg/dL — ABNORMAL HIGH (ref 70–99)
Glucose-Capillary: 134 mg/dL — ABNORMAL HIGH (ref 70–99)
Glucose-Capillary: 139 mg/dL — ABNORMAL HIGH (ref 70–99)
Glucose-Capillary: 158 mg/dL — ABNORMAL HIGH (ref 70–99)
Glucose-Capillary: 189 mg/dL — ABNORMAL HIGH (ref 70–99)
Glucose-Capillary: 208 mg/dL — ABNORMAL HIGH (ref 70–99)

## 2020-10-23 LAB — HEPARIN LEVEL (UNFRACTIONATED)
Heparin Unfractionated: 0.24 IU/mL — ABNORMAL LOW (ref 0.30–0.70)
Heparin Unfractionated: 0.2 IU/mL — ABNORMAL LOW (ref 0.30–0.70)

## 2020-10-23 LAB — MAGNESIUM: Magnesium: 2.6 mg/dL — ABNORMAL HIGH (ref 1.7–2.4)

## 2020-10-23 MED ORDER — PIPERACILLIN-TAZOBACTAM 3.375 G IVPB 30 MIN
3.3750 g | Freq: Four times a day (QID) | INTRAVENOUS | Status: DC
  Administered 2020-10-23 – 2020-10-24 (×4): 3.375 g via INTRAVENOUS
  Filled 2020-10-23 (×7): qty 50

## 2020-10-23 NOTE — Progress Notes (Signed)
NAMEKayln [name], MRN:  [id number], DOB:  [birth year], LOS: 20 ADMISSION DATE:  08/31/2020, CONSULTATION DATE:  09/07/2020 REFERRING MD:  Dr Candiss Norse, CHIEF COMPLAINT:  Acute resp failure  Brief History   69 year old female who was previously diagnosed with Covid 08/23/2020.  Admitted 11/2 with AF-RVR, found to have a perforated duodenal underwent exploratory laparotomy with Phillip Heal patch placement.  Past Medical History  Covid pneumonia Atrial fibrillation CKD stage III Diabetes mellitus Hypertension Colon cancer Hyperlipidemia  Significant Hospital Events   11/2 Admitted  11/3 OR with findings of perforated duodenal ulcer 11/10 progressive hemorrhagic shock, intubated, transfused, pressors, proned; started on CRRT in PM 11/03 Exploratory laparotomy, Phillip Heal patch, lysis of adhesion for duodenal ulceration postop day 6 11/16 Extubated. Re-intubated overnight due to respiratory distress and hypoxia with decreased mentation 11/18 Bronch, cultures sent 11/19 Hgb down getting blood 11/24 Spiked fever resume empirical antimicrobial therapy 11/26 Hemorrhagic shock, hgb 5.6, increased pressors, CT A/P  11/30 Per palliative "[name] expresses understanding that patient is unfortunately very fragile despite ongoing intensive medical treatment and full mechanical support. She indicates that the family wants to continue with all current interventions despite potential outcomes". 12/08 CRRT discontinued due to clotting 12/09 Family requested transfer to tertiary care [facility] denied transfer  12/10 CRRT restarted. Episodes of tachycardia, tachypnea that seem to improve with pain management 12/11 Back in shock. Pressor requirements up. CXR worse. ABX resumed 12/12 Still requiring inc pressors. Had hypoglycemic event. Basal insulin dosing adjusted 12/13 Pressor requirements better. Now hyperglycemic. Re-adjusted Glycemic control  12/14 Changed dilaudid to1/2 dosing from day further. D/c  vasopressin.  Goals of care reconfirmed with daughter.  Patient continues to desire aggressive care.  Not open to discussing any other option, patient family continues to be hopeful that she will be discharged to home with full recovery in spite of multiple attempts by staff to prepare them that this is unlikely scenario 12/15 Dilaudid discontinued sending cortisol for ongoing pressor dependence 12/16 Serum cortisol <20, added stress dose steroids.  PRN Dilaudid, attempting not go back on Dilaudid infusion 12/17 Developed worsening tachycardia during the evening hours received initially IV albumin, followed by resuming IV Dilaudid with question of suboptimal pain control.  Currently looks better back on Dilaudid drip.  We have been able to wean pressors after adding stress dose steroids; near arrest - bradycardia, better with atropine  12/19 Afebrile . Remains on dilaudid and heparin gtt, dilaudid gtt increased overnight for concern of pain / ongoing tachycardia, no other events . NE and precedex off 12/17. Ongoing CRRT- even UF, dosing lokelmia this morning 12/20 On CRRT.  Renal plans for HD cath and moving to HD. Getting wound care  12/23 -No vent weaning per RT, remains on full support, #8 trach in place but previously had #6, no documentation of change noted in chart Afebrile / WBC 20.4  Vent - 30% FiO2, PEEP 5 Glucose range 176-212 I/O 465 ml stool, 3.6L removed with HD, -1.1L in last 24 hours  RN reports ongoing periods of tachypnea / vent dyssynchrony that responds to dilaudid    12/24 - renal stopping CRRT today and plans HD 10/24/20 . 40% fio2 on vent via Trach. TAchypenic and tachycardic. Afebrile but wbc up to 27.6K. On TF. Onn heparin gtt  Consults:  Cardiology [facility] Nephrology   Procedures:  R PICC 11/5 >> A line 11/9 >> out ETT 11/9 > 11/16, 11/16 >> 09/21/2020, 09/21/2020 tracheostomy>> Lt Pyatt CVL 11/9 >> R IJ trialysis >>  out HD catheter 12/1 >>12/20 12/21 - 14.5 Fr, 23 cm  right IJ tunneled hemodialysis catheter placement.  Removal of indwelling subclavian catheter.   Significant Diagnostic Tests:  11/3 CT abd/ pelvis > 1. Positive for bowel perforation: Pneumoperitoneum and intermediate density free fluid in the abdomen. Prior total colectomy. The specific site of perforation is unclear-oral contrast present to the proximal jejunum has not obviously leaked. Note that there may be small bowel loops adherent to the ventral abdominal wall along the greater curve of the stomach. 2. Extensive bilateral lower lung pneumonia. No pleural effusion. 3. Other abdominal and pelvic viscera are stable since 2015, including bilateral adrenal adenomas. Chronic renal parapelvic cysts. 4. Aortic Atherosclerosis 11/3 TTE > EF 70-75%, RV not well visualized, mildly reduced RV systolic function 16/60 CT chest/ abd/ pelvis> 1. Interval progression of diffuse bilateral hazy ground-glass airspace opacities with more focal areas of consolidation at the lung bases 2. Trace bilateral pleural effusions. 3. Postsurgical changes the abdomen as detailed above. No evidence for a postoperative abscess, however evaluation is limited by lack of IV contrast. 4. There is a 1.9 cm cystic appearing lesion located in the pancreatic body. This was not present on the patient's CT from 2015.  Follow-up with an outpatient contrast enhanced MRI is recommended. 5. The endometrial stripe appears diffusely thickened. Follow-up with pelvic ultrasound is recommended. Aortic Atherosclerosis 11/14 LE doppler studies > + DVT of right posterior tibial and peroneal vein, +dVT of left posterior tibial vein  11/26 CT ABD PEL > liver unremarkable, distended gallbladder with layering tiny gallstones versus sludge, no duct dilatation, mild hyperdensity right upper pole renal collecting system new.  No evidence of retroperitoneal bleeding.  Multifocal lower lobe predominant pulmonary infiltrates/pneumonia.  Small left  pleural effusion.  Micro Data:  11/10 MRSA PCR > neg 11/10 BC x 2 > neg 09/22/2020 blood cultures x2>> S epi.  09/22/2020 sputum culture>> MRSA Blood 12/1 >> negative. S.epi 1 out of 2, likely contaminant BCx 2 12/12 >> neg xxx Trach aspirate 12/24 Blood 12/24  Antimicrobials:  azithro 11/2 >11/3 Ceftriaxone 11/2  Fluconazole 11/3 Zosyn 11/3 >> 11/7 Vanc 11/10 off Cefepime 11/10 > 11/16 09/22/2020 vancomycin for MRSA PNA >> 12/2 xxxx Vanc 12/11> 12/17 Zosyn 12/11> 12/17 xxxx vanc 12/24 Zosyun 12/27 -   Subjective:    10/23/2020  - Back on CRRT. On vent via trach at 40% fio2, On scheduled dilaudid as add on to oxy. Pper family request 12/24 - they felt scheduled oxy was not adequate and patien was showing signs of opioid withdrawal.   Patient also had  worsening SIRS/sepsis syndrome. Had fever, rising wbc, worsening RR 40-60 and HR 140s sinsu-> strated On abx yesterday. Fever some better today. WBC plateau at 28,.5K. On new levophed -> signifanct improvement in HR 77 andRR t0 20  On heparin gtt On precedex gtt On levophed gtt 55mcg wthi midodrine  On TF   Objective   Blood pressure 107/65, pulse 79, temperature 99.6 F (37.6 C), temperature source Oral, resp. rate (!) 28, height 5\' 8"  (1.727 m), weight 75.3 kg, SpO2 99 %.    Vent Mode: PRVC FiO2 (%):  [40 %] 40 % Set Rate:  [28 bmp] 28 bmp Vt Set:  [450 mL] 450 mL PEEP:  [5 cmH20] 5 cmH20 Plateau Pressure:  [15 cmH20-28 cmH20] 15 cmH20   Intake/Output Summary (Last 24 hours) at 10/23/2020 0944 Last data filed at 10/23/2020 0900 Gross per 24 hour  Intake 4364.42 ml  Output  2316 ml  Net 2048.42 ml   Filed Weights   10/21/20 0600 10/22/20 0500 10/23/20 0500  Weight: 77.1 kg 75.1 kg 75.3 kg   General Appearance:  Looks criticall ill and chronic criticaly ll Head:  Normocephalic, without obvious abnormality, atraumatic Eyes:  PERRL - yes, conjunctiva/corneas - muddy     Ears:  Normal external ear canals,  both ears Nose:  G tube - panda Throat:  ETT TUBE - no , OG tube - no. TRACH + Neck:  Supple,  No enlargement/tenderness/nodules Lungs: Clear to auscultation bilaterally, Ventilator   Synchrony - yes Heart:  S1 and S2 normal, no murmur, CVP - x.  Pressors - levophed 78mcg Abdomen:  Soft, no masses, no organomegaly Genitalia / Rectal:  Not done Extremities:  Extremities- intact Skin:  ntact in exposed areas . Sacral area - not examined but has decub per RN Neurologic:  Sedation - schedule oxy + dilaudid IV scheduled + precedex gtt  -> RASS - -3 (did squeeze fingers per RN)        Resolved problems:  Previously hemorrhagic shock requiring PRBCs, Septic shock, MRSA pneumonia s/p 8 days abx Recurrent septic shock 12/11 -working dx recurrent HCAP c/b adrenal insuff - Completed 7 days of vanc and zosyn 12/17. Culture negative. 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  Has not been able to make progress with vent weaning, remains  10/23/2020 - still on PRVC. Unable to wean due to sepsis and chronic critical illess.  PLAn -PRVC / lung protective ventilation  -continue SBT trials, may need to accept a degree of tachypnea  -follow intermittent CXR -VAP prevention measures  SEpsis syndrome - recurrence 10/22/20 with severe SIRS HR/RR instability and high PCT. Source unclear  10/23/2020 - improved fever after abx. Improved RR/HR after sedation adjustments and abx  plan - vanc 12/24 - zosyn 12/24  -await cultures 12/24  -  Circulatory shock  10/23/2020 = -on levophed 54mc overnight . Likely due to sepsis and precedex  Plan  - levophed for MAP > 65 - midodrine to continue - dc precedex when able   Acute metabolic encephalopathy in the setting of renal failure, shock, sepsis  - 10/23/2020  Followed a  command for nursing. Family felt on 12/22 and 12/24 that scheduled oxy was inadequaite and patient was showing signs of opioid withdrawal from long ICU stay. Cted instability with RR/HR as rationale for that.  -> scheduled dilaudid added 12/24 along with precedex and abx and > improved HR/RR  Plan Dc scheduled oxycodone cotninue scheduled IV dilaudid q4h since 10/22/20 contnue versed prn coninue seroquel  -PT efforts as able  -follow neuro exam   Oliguric / Anuric ATN - CRRT started on 11/10; held 12/8; resumed on 12/10. Unable to transition to iHD (current goal) due to instabilyt  12/24 - CRRT stopped with plan sfor iHD 12/26 but resumed 12/25 due to sepsis syndrome and shock  plan -appreciate Nephrology    Duodenal ulcer perforation status post ex lap and Graham's patch  10/23/2020 - No active bleed  plan Appreciate CTS assistance and management -wound care  -Follow colostomy  -hydrotherapy per [facility]OC   Paroxysmal A. fib, currently in sinus rhythm  10/23/2020 - HR 77 and improved significantly in 24h   Plan -heparin gtt per pharmacy  -follow CBC    Indeterminate bilateral DVT -  IV heparin per pharmacy   Anemia of critical illness  10/23/2020 - no active bleed  Plan  - - PRBC for hgb </= 6.9gm%    - exceptions are   -  if ACS susepcted/confirmed then transfuse for hgb </= 8.0gm%,  or    -  active bleeding with hemodynamic instability, then transfuse regardless of hemoglobin value   At at all times try to transfuse 1 unit prbc as possible with exception of active hemorrhage    Diabetes type 2 w/ marked Hyperglycemia -CBG goal 140-180 -SSI, lantus, TF coverage -improved off steroids  History of stage IV colon cancer Status post pulmonary [facility]atect[facility] at [facility] + Chemo 1.9 cm cystic appearing lesion located in the pancreatic body -  Plan will need ONC follow up if survives critical illness   Best practice:  Diet: TF Pain/Anxiety/Delirium protocol (if  indicated) dilaudid gtt/ enteral sedation-  clonazepam, oxy, seroquel VAP protocol (if indicated): yes DVT prophylaxis: IV heparin GI prophylaxis: Protonix Glucose control: SSI, TF coverage, lantus Mobility: Bedrest  Goals of Care:  Last date of multidisciplinary goals of care discussion:12/14 and then again 10/22/20  Family and staff present:  nurse practitioner, bedside nurse, and the patient's daughter  Summary of discussion:  There have been no changes in goals of care, family continues to desire any and all therapies, remains full code, family remains hopeful that she will survive and be discharged to home Follow up goals of care discussion due: no change in goals of care.    Code Status: Full    Multi-Disciplinary Goals of Care Discussion Date of Discussion 10/22/2020 and is Day 39 since admit  Primary service for patient ccm  Location of discussion Bedside [id number]  Family and Staff present Autumn RN, Chase Caller MD, [name] DPOA over video and one oother daughter at bedside  Summary of discussion MOst of discussion around SIRS (high HR and RR)  Followup goals of care due by 10/29/20  Misc comments if any continue full active care and full code    Family updates -> Daughter [name] [phone] - updated 10/23/20. Daughter acknowledged that patient better today. She expressed concern that patient was very unstable yesterday and this has contributed to setback and pressor need. Says RN could not get temperature yesterday and was asking what protocols in place for not being able to record temp. Advised to talk to [place]   The patient Candace [name] is critically ill with multiple organ systems failure and requires high complexity decision making for assessment and support, frequent evaluation and titration of therapies, application of advanced monitoring technologies and extensive interpretation of multiple databases.   Critical Care Time devoted  to patient care services described in this note is  45  Minutes. This time reflects time of care of this signee Dr Brand Males. This critical care time does not reflect procedure time, or teaching time or supervisory time of PA/NP/Med student/Med Resident etc but could involve care discussion time     Dr. Brand Males, M.D., [facility].C.P Pulmonary and Critical Care Medicine Staff Physician [place] [facility] Pager: [phone], If no answer or between  15:00h - 7:00h: call [phone]  10/23/2020 10:10 AM      LABS    PULMONARY Recent Labs  Lab 10/18/20 0507 10/22/20 1441  PHART 7.387 7.416  PCO2ART 42.4 38.3  PO2ART 105 67*  HCO3 24.9 24.4  TCO2  --  26  O2SAT 98.4 93.0    CBC Recent Labs  Lab 10/22/20 0329 10/22/20 1441 10/22/20 1736 10/23/20 0454  HGB 8.5* 9.5* 7.6* 7.3*  HCT 27.0* 28.0* 25.3* 24.3*  WBC 27.6*  --  27.2* 28.5*  PLT 320  --  329 314    COAGULATION No results for input(s): INR in the last 168 hours.  CARDIAC  No results for input(s): TROPONINI in the last 168 hours. No results for input(s): PROBNP in the last 168 hours.   CHEMISTRY Recent Labs  Lab 10/19/20 0457 10/19/20 1551 10/20/20 0350 10/20/20 1633 10/21/20 0308 10/21/20 1609 10/22/20 0329 10/22/20 1441 10/22/20 1736 10/23/20 0454  NA 132*   < > 132* 133* 132* 130* 131* 132* 129* 132*  K 4.3   < > 4.4 4.6 4.9 4.7 4.5 5.2* 6.0* 5.2*  CL 100   < > 98 99 98 96* 96*  --  96* 99  CO2 24   < > 25 26 25  21* 24  --  22 22  GLUCOSE 145*   < > 161* 169* 241* 364* 194*  --  179* 161*  BUN 38*   < > 37* 40* 39* 40* 38*  --  76* 72*  CREATININE 0.49   < > 0.44 0.48 0.48 0.51 0.47  --  0.81 0.79  CALCIUM 10.0   < > 10.1 10.3 10.6* 11.2* 11.5*  --  12.6* 11.1*  MG 2.4  --  2.8*  --  2.5*  --  2.6*  --   --  2.6*  PHOS 2.7   < > 2.8 2.8 2.9 4.8* 3.8  --   --  4.9*   < > = values in this interval not displayed.   Estimated Creatinine  Clearance: 67 mL/min (by C-G formula based on SCr of 0.79 mg/dL).   LIVER Recent Labs  Lab 10/19/20 1836 10/20/20 0350 10/20/20 1633 10/21/20 0308 10/21/20 1609 10/22/20 0329 10/23/20 0454  AST 74*  --   --   --   --   --   --   ALT 85*  --   --   --   --   --   --   ALKPHOS 332*  --   --   --   --   --   --   BILITOT 0.6  --   --   --   --   --   --   PROT 7.4  --   --   --   --   --   --   ALBUMIN 2.4*   < > 2.4* 2.4* 2.4* 2.4* 2.4*   < > = values in this interval not displayed.     INFECTIOUS Recent Labs  Lab 10/19/20 1836 10/22/20 1248  LATICACIDVEN 1.6 1.4  PROCALCITON  --  4.96     ENDOCRINE CBG (last 3)  Recent Labs    10/22/20 2328 10/23/20 0343 10/23/20 0738  GLUCAP 300* 158* 139*

## 2020-10-23 NOTE — Progress Notes (Signed)
Patient ID: Candace [name], female   DOB: [birth year], 69 y.o.   MRN: [id number]    S:  Events noted- CRRT stopped at 10 AM-  Was only able to be off for 12 hours...Marland KitchenMarland KitchenMarland Kitchen k climbed to 6.0 and sodium down to 129-  So was restarted-  k 5.2 this AM.  Also had developed a sinus tach-  Thought maybe due to infection/sepsis or narcotic withdrawal.  Given vanc and zosyn as well as scheduled narcotics and seems more stable this AM   O:BP 107/65   Pulse 79   Temp 99.6 F (37.6 C) (Oral)   Resp (!) 28   Ht _0  (1.727 m)   Wt 75.3 kg   SpO2 99%   BMI 25.24 kg/m   Intake/Output Summary (Last 24 hours) at 10/23/2020 1024 Last data filed at 10/23/2020 1000 Gross per 24 hour  Intake 4103.53 ml  Output 1912 ml  Net 2191.53 ml   Intake/Output: I/O last 3 completed shifts: In: 6307.6 [I.V.:1034.6; NG/GT:3800; IV Piggyback:1473] Out: 7544 [Other:2729; Stool:1100]  Intake/Output this shift:  Total I/O In: 330.8 [I.V.:135.8; NG/GT:195] Out: 186 [Other:186] Weight change: 0.2 kg Gen: on vent via trach, flat affect- no commands  CVS: tachy at 119 Resp: scattered rhonchi Abd: +BS, soft, ostomy in RLQ Ext: trace dependent edema  Recent Labs  Lab 10/19/20 1551 10/19/20 1836 10/20/20 0350 10/20/20 1633 10/21/20 0308 10/21/20 1609 10/22/20 0329 10/22/20 1441 10/22/20 1736 10/23/20 0454  NA 131*  --  132* 133* 132* 130* 131* 132* 129* 132*  K 4.3  --  4.4 4.6 4.9 4.7 4.5 5.2* 6.0* 5.2*  CL 97*  --  98 99 98 96* 96*  --  96* 99  CO2 24  --  _1 21* 24  --  22 22  GLUCOSE 212*  --  161* 169* 241* 364* 194*  --  179* 161*  BUN 45*  --  37* 40* 39* 40* 38*  --  76* 72*  CREATININE 0.45  --  0.44 0.48 0.48 0.51 0.47  --  0.81 0.79  ALBUMIN 2.4* 2.4* 2.3* 2.4* 2.4* 2.4* 2.4*  --   --  2.4*  CALCIUM 10.1  --  10.1 10.3 10.6* 11.2* 11.5*  --  12.6* 11.1*  PHOS 3.1  --  2.8 2.8 2.9 4.8* 3.8  --   --  4.9*  AST  --  74*  --   --   --   --   --   --   --   --   ALT  --  85*  --   --   --   --    --   --   --   --    Liver Function Tests: Recent Labs  Lab 10/19/20 1836 10/20/20 0350 10/21/20 1609 10/22/20 0329 10/23/20 0454  AST 74*  --   --   --   --   ALT 85*  --   --   --   --   ALKPHOS 332*  --   --   --   --   BILITOT 0.6  --   --   --   --   PROT 7.4  --   --   --   --   ALBUMIN 2.4*   < > 2.4* 2.4* 2.4*   < > = values in this interval not displayed.   No results for input(s): LIPASE, AMYLASE in the last 168 hours. No results for input(s):  AMMONIA in the last 168 hours. CBC: Recent Labs  Lab 10/20/20 0350 10/21/20 0308 10/22/20 0329 10/22/20 1441 10/22/20 1736 10/23/20 0454  WBC 15.4* 20.4* 27.6*  --  27.2* 28.5*  HGB 7.7* 8.1* 8.5* 9.5* 7.6* 7.3*  HCT 26.4* 27.4* 27.0* 28.0* 25.3* 24.3*  MCV 96.7 96.8 95.1  --  95.8 96.0  PLT 294 323 320  --  329 314   Cardiac Enzymes: Recent Labs  Lab 10/22/20 1248  CKTOTAL 26*  CKMB 15.4*   CBG: Recent Labs  Lab 10/22/20 1539 10/22/20 1932 10/22/20 2328 10/23/20 0343 10/23/20 0738  GLUCAP 139* 219* 300* 158* 139*    Iron Studies: No results for input(s): IRON, TIBC, TRANSFERRIN, FERRITIN in the last 72 hours. Studies/Results: DG CHEST PORT 1 VIEW  Result Date: 10/22/2020 CLINICAL DATA:  Tachypnea, decreased oxygen saturation, hypotension, COVID-19 positive EXAM: PORTABLE CHEST 1 VIEW COMPARISON:  Portable exam 1551 hours compared to 10/20/2020 FINDINGS: Tracheostomy tube, feeding tube, and RIGHT jugular dialysis catheter unchanged. Normal heart size mediastinal contours. Patchy airspace infiltrates in the mid to lower lungs again seen. No pleural effusion or pneumothorax. LEFT glenohumeral degenerative changes. IMPRESSION: Persistent BILATERAL pulmonary infiltrates consistent with multifocal pneumonia and history of COVID-19. Electronically Signed   By: Lavonia Dana M.D.   On: 10/22/2020 16:03   . B-complex with vitamin C  1 tablet Per Tube Daily  . chlorhexidine gluconate (MEDLINE KIT)  15 mL Mouth Rinse  BID  . Chlorhexidine Gluconate Cloth  6 each Topical Daily  . cholecalciferol  2,000 Units Per Tube Daily  . clonazepam  1 mg Per Tube BID  . collagenase   Topical Daily  . [START ON 10/26/2020] darbepoetin (ARANESP) injection - DIALYSIS  100 mcg Subcutaneous Q Tue-1800  . docusate  100 mg Per Tube BID  . feeding supplement (PROSource TF)  90 mL Per Tube TID  . guaiFENesin  15 mL Per Tube Q6H  .  HYDROmorphone (DILAUDID) injection  1 mg Intravenous Q4H  . insulin aspart  0-15 Units Subcutaneous Q4H  . insulin aspart  7 Units Subcutaneous Q4H  . insulin glargine  40 Units Subcutaneous Daily  . mouth rinse  15 mL Mouth Rinse QID  . midodrine  20 mg Per Tube TID WC  . nutrition supplement (JUVEN)  1 packet Per Tube BID BM  . pantoprazole sodium  40 mg Per Tube BID  . polyethylene glycol  17 g Per Tube Daily  . QUEtiapine  100 mg Per Tube BID  . sodium chloride flush  10-40 mL Intracatheter Q12H    BMET    Component Value Date/Time   NA 132 (L) 10/23/2020 0454   K 5.2 (H) 10/23/2020 0454   CL 99 10/23/2020 0454   CO2 22 10/23/2020 0454   GLUCOSE 161 (H) 10/23/2020 0454   BUN 72 (H) 10/23/2020 0454   CREATININE 0.79 10/23/2020 0454   CALCIUM 11.1 (H) 10/23/2020 0454   GFRNONAA >60 10/23/2020 0454   CBC    Component Value Date/Time   WBC 28.5 (H) 10/23/2020 0454   RBC 2.53 (L) 10/23/2020 0454   HGB 7.3 (L) 10/23/2020 0454   HCT 24.3 (L) 10/23/2020 0454   PLT 314 10/23/2020 0454   MCV 96.0 10/23/2020 0454   MCH 28.9 10/23/2020 0454   MCHC 30.0 10/23/2020 0454   RDW 16.3 (H) 10/23/2020 0454   LYMPHSABS 0.4 (L) 10/04/2020 0516   MONOABS 0.2 10/04/2020 0516   EOSABS 0.0 10/04/2020 0516   BASOSABS 0.0  10/04/2020 0516     Assessment/Plan:  1. AKI- oliguric presumably due to ischemic ATN in setting of hemorrhagic shock. Started on CRRT 09/08/20 due to persistent oliguria and hyperkalemia. 1. All fluids 4K/2.5Ca: Pre-filter to 500 ml/hr, post-filter 200 ml/hr, and  dialysate to 1000 ml/hr.  2. Keeping even. 3. No anticoagulation due to thrombocytopenia and ongoing GI blood loss. Continue with current CRRT settings. Pt has deteriorated several times when trying to transition to IHD but had been a week since we last tried-  Tried again yesterday which unfortunately failed again.... this is an issue-  Not sure when we will be able to transition her- cannot keep her on CRRT forever 4. RIJ TDC placed 10/19/20 by IR 2. Hemorrhagic shock- ongoing blood loss and requiring pressors. 1. Transfuse as needed-  Have added ESA  3. Septic shock due to MRSA pneumonia. 4. Perforated duodenal ulcer- s/p exploratory lap and Graham patch placement per surgery. 5. Acute hypoxic/hypercapnic respiratory failure due to ARDS from Recent Covid-19 PNA- currently on vent via trach per PCCM. low o2 6. P. Atrial fibrillation- no anticoagulation due to GI bleed. 7. Hyponatremia- worsened quickly off of CRRT- now better  8. Severe protein malnutrition- per primary svc. 9. Disposition- poor overall prognosis and will likely require LTAC and chronic vent support if we can ever get her transitioned to IHD. PCCM to have ongoing goals of care meetings with family.   Louis Meckel  [employer] [phone]

## 2020-10-23 NOTE — Progress Notes (Signed)
Assisted tele visit to patient with family member.  Rafiel Mecca M, RN  

## 2020-10-23 NOTE — Progress Notes (Signed)
ANTICOAGULATION CONSULT NOTE - Follow Up Consult  Pharmacy Consult for heparin Indication: atrial fibrillation  Labs: Recent Labs    10/22/20 0329 10/22/20 0330 10/22/20 1248 10/22/20 1441 10/22/20 1736 10/23/20 0454 10/23/20 1442  HGB 8.5*  --   --  9.5* 7.6* 7.3*  --   HCT 27.0*  --   --  28.0* 25.3* 24.3*  --   PLT 320  --   --   --  329 314  --   HEPARINUNFRC  --  0.37  --   --   --  0.24* 0.20*  CREATININE 0.47  --   --   --  0.81 0.79 0.71  CKTOTAL  --   --  26*  --   --   --   --   CKMB  --   --  15.4*  --   --   --   --     Assessment: 69yo female with Afib on heparin drip 1300 uts/hr running through central line.  HL 0.2 < goal drawn by lab as peripheral stick.  No bleeding notes, h/h low relatively stable, no issues with IV lines per RN.  Goal of Therapy:  Heparin level 0.3-0.5 units/ml   Plan:  Will increase heparin drip to 1400 units/hr  Daily HL, CBC      Bonnita Nasuti Pharm.D. CPP, BCPS Clinical Pharmacist [phone] 10/23/2020 4:52 PM

## 2020-10-23 NOTE — Progress Notes (Addendum)
ANTICOAGULATION CONSULT NOTE - Follow Up Consult  Pharmacy Consult for heparin Indication: atrial fibrillation  Labs: Recent Labs    10/21/20 0308 10/21/20 1609 10/22/20 0329 10/22/20 0330 10/22/20 1248 10/22/20 1441 10/22/20 1736 10/23/20 0454  HGB 8.1*  --  8.5*  --   --  9.5* 7.6* 7.3*  HCT 27.4*  --  27.0*  --   --  28.0* 25.3* 24.3*  PLT 323  --  320  --   --   --  329 314  HEPARINUNFRC 0.37  --   --  0.37  --   --   --  0.24*  CREATININE 0.48   < > 0.47  --   --   --  0.81 0.79  CKTOTAL  --   --   --   --  26*  --   --   --   CKMB  --   --   --   --  15.4*  --   --   --    < > = values in this interval not displayed.    Assessment: 69yo female subtherapeutic on heparin after several levels at goal; no gtt issues or signs of bleeding per RN.  Goal of Therapy:  Heparin level 0.3-0.5 units/ml   Plan:  Will increase heparin gtt by 1-2 units/kg/hr to 1300 units/hr and check level in 6 hours.    Wynona Neat, PharmD, BCPS  10/23/2020,6:27 AM

## 2020-10-23 NOTE — Progress Notes (Signed)
PHARMACY NOTE:  ANTIMICROBIAL RENAL DOSAGE ADJUSTMENT  Current antimicrobial regimen includes a mismatch between antimicrobial dosage and estimated renal function.  As per policy approved by the Pharmacy & Therapeutics and Medical Executive Committees, the antimicrobial dosage will be adjusted accordingly.  Current antimicrobial dosage:  Zosyn 3.375g IV q8h (30 minute infusion)  Indication: elevated WBC  Renal Function:  Estimated Creatinine Clearance: 67 mL/min (by C-G formula based on SCr of 0.79 mg/dL). []      On intermittent HD, scheduled: [x]      On CRRT    Antimicrobial dosage has been changed to:  Zosyn 3/375g IV q6h for CRRT  Additional comments:   Thank you for allowing pharmacy to be a part of this patient's care.  Henri Medal, [facility] 10/23/2020 11:55 AM

## 2020-10-24 DIAGNOSIS — I4891 Unspecified atrial fibrillation: Secondary | ICD-10-CM | POA: Diagnosis not present

## 2020-10-24 DIAGNOSIS — J9620 Acute and chronic respiratory failure, unspecified whether with hypoxia or hypercapnia: Secondary | ICD-10-CM | POA: Diagnosis not present

## 2020-10-24 LAB — CBC
HCT: 23.9 % — ABNORMAL LOW (ref 36.0–46.0)
Hemoglobin: 7 g/dL — ABNORMAL LOW (ref 12.0–15.0)
MCH: 28.2 pg (ref 26.0–34.0)
MCHC: 29.3 g/dL — ABNORMAL LOW (ref 30.0–36.0)
MCV: 96.4 fL (ref 80.0–100.0)
Platelets: 286 10*3/uL (ref 150–400)
RBC: 2.48 MIL/uL — ABNORMAL LOW (ref 3.87–5.11)
RDW: 16.4 % — ABNORMAL HIGH (ref 11.5–15.5)
WBC: 18.7 10*3/uL — ABNORMAL HIGH (ref 4.0–10.5)
nRBC: 1.2 % — ABNORMAL HIGH (ref 0.0–0.2)

## 2020-10-24 LAB — MAGNESIUM: Magnesium: 2.6 mg/dL — ABNORMAL HIGH (ref 1.7–2.4)

## 2020-10-24 LAB — RENAL FUNCTION PANEL
Albumin: 2.2 g/dL — ABNORMAL LOW (ref 3.5–5.0)
Albumin: 2.1 g/dL — ABNORMAL LOW (ref 3.5–5.0)
Anion gap: 10 (ref 5–15)
Anion gap: 10 (ref 5–15)
BUN: 53 mg/dL — ABNORMAL HIGH (ref 8–23)
BUN: 45 mg/dL — ABNORMAL HIGH (ref 8–23)
CO2: 23 mmol/L (ref 22–32)
CO2: 24 mmol/L (ref 22–32)
Calcium: 10.5 mg/dL — ABNORMAL HIGH (ref 8.9–10.3)
Calcium: 9.8 mg/dL (ref 8.9–10.3)
Chloride: 100 mmol/L (ref 98–111)
Chloride: 98 mmol/L (ref 98–111)
Creatinine, Ser: 0.65 mg/dL (ref 0.44–1.00)
Creatinine, Ser: 0.58 mg/dL (ref 0.44–1.00)
GFR, Estimated: >60 mL/min (ref >60)
GFR, Estimated: >60 mL/min (ref >60)
Glucose, Bld: 128 mg/dL — ABNORMAL HIGH (ref 70–99)
Glucose, Bld: 159 mg/dL — ABNORMAL HIGH (ref 70–99)
Phosphorus: 4.3 mg/dL (ref 2.5–4.6)
Phosphorus: 3.6 mg/dL (ref 2.5–4.6)
Potassium: 5 mmol/L (ref 3.5–5.1)
Potassium: 4.9 mmol/L (ref 3.5–5.1)
Sodium: 133 mmol/L — ABNORMAL LOW (ref 135–145)
Sodium: 132 mmol/L — ABNORMAL LOW (ref 135–145)

## 2020-10-24 LAB — GLUCOSE, CAPILLARY
Glucose-Capillary: 98 mg/dL (ref 70–99)
Glucose-Capillary: 135 mg/dL — ABNORMAL HIGH (ref 70–99)
Glucose-Capillary: 164 mg/dL — ABNORMAL HIGH (ref 70–99)
Glucose-Capillary: 155 mg/dL — ABNORMAL HIGH (ref 70–99)
Glucose-Capillary: 129 mg/dL — ABNORMAL HIGH (ref 70–99)

## 2020-10-24 LAB — HEPARIN LEVEL (UNFRACTIONATED)
Heparin Unfractionated: 0.34 IU/mL (ref 0.30–0.70)
Heparin Unfractionated: 0.47 IU/mL (ref 0.30–0.70)

## 2020-10-24 MED ORDER — DEXMEDETOMIDINE HCL IN NACL 400 MCG/100ML IV SOLN
0.4000 ug/kg/h | INTRAVENOUS | Status: DC
  Administered 2020-10-24: 23:00:00 0.6 ug/kg/h via INTRAVENOUS
  Administered 2020-10-24: 15:00:00 1.1 ug/kg/h via INTRAVENOUS
  Administered 2020-10-25 – 2020-10-26 (×4): 0.6 ug/kg/h via INTRAVENOUS
  Administered 2020-10-27: 0.7 ug/kg/h via INTRAVENOUS
  Filled 2020-10-24 (×6): qty 100

## 2020-10-24 MED ORDER — CHLORHEXIDINE GLUCONATE CLOTH 2 % EX PADS
6.0000 | MEDICATED_PAD | Freq: Every day | CUTANEOUS | Status: DC
  Administered 2020-10-24 – 2020-12-10 (×46): 6 via TOPICAL

## 2020-10-24 MED ORDER — MIDODRINE HCL 5 MG PO TABS
30.0000 mg | ORAL_TABLET | Freq: Three times a day (TID) | ORAL | Status: DC
  Administered 2020-10-24 – 2020-10-30 (×17): 30 mg
  Filled 2020-10-24 (×17): qty 6

## 2020-10-24 NOTE — Progress Notes (Addendum)
ANTICOAGULATION CONSULT NOTE - Follow Up Consult  Pharmacy Consult for heparin Indication: atrial fibrillation  Labs: Recent Labs    10/22/20 1248 10/22/20 1441 10/22/20 1736 10/23/20 0454 10/23/20 1442 10/24/20 0459  HGB  --    < > 7.6* 7.3*  --  7.0*  HCT  --    < > 25.3* 24.3*  --  23.9*  PLT  --   --  329 314  --  286  HEPARINUNFRC  --   --   --  0.24* 0.20* 0.34  CREATININE  --   --  0.81 0.79 0.71 0.65  CKTOTAL 26*  --   --   --   --   --   CKMB 15.4*  --   --   --   --   --    < > = values in this interval not displayed.    Assessment: 69 yo female with history of Afib on apixaban prior to admission which has been held and patient transitioned to IV heparin per pharmacy dosing.   Heparin level 0.34 on 1400 units/hr is therapeutic. Hgb 7.0. Plt 286. Per RN possibly some minimal vaginal vs. wound bleeding, RN to assess today and contact pharmacy and MD if present.  Goal of Therapy:  Heparin level 0.3-0.5 units/ml   Plan:  Continue heparin 1400 units/hr  Check heparin level at 1400 Monitor heparin level, CBC and S/S of bleeding    Cristela Felt, PharmD Clinical Pharmacist  10/24/2020 7:13 AM   Addendum: Heparin level 0.47 on 1400 units/hr is therapeutic though at upper end of range we are targeting. Discussed with RN and minimal-moderate bleeding from decubitus ulcer.   Plan:  - Decrease heparin to 1350 units/hr  - Check heparin level next with AM labs

## 2020-10-24 NOTE — Progress Notes (Signed)
Pt having frequent pauses with HR temporary dipping in the 40s.  Shut off the dexmedetomidine gtt and informed Dr. Chase Caller.  No new orders received at this time.  Will continue to monitor.   Irven Baltimore, RN

## 2020-10-24 NOTE — Progress Notes (Signed)
NAMEJaylia [name], MRN:  [id number], DOB:  1951/03/23, LOS: 76 ADMISSION DATE:  08/31/2020, CONSULTATION DATE:  09/07/2020 REFERRING MD:  Dr Candiss Norse, CHIEF COMPLAINT:  Acute resp failure  Brief History   69 year old female who was previously diagnosed with Covid 08/23/2020.  Admitted 11/2 with AF-RVR, found to have a perforated duodenal underwent exploratory laparotomy with Phillip Heal patch placement.  Past Medical History  Covid pneumonia Atrial fibrillation CKD stage III Diabetes mellitus Hypertension Colon cancer Hyperlipidemia  Significant Hospital Events   11/2 Admitted  11/3 OR with findings of perforated duodenal ulcer 11/10 progressive hemorrhagic shock, intubated, transfused, pressors, proned; started on CRRT in PM 11/03 Exploratory laparotomy, Phillip Heal patch, lysis of adhesion for duodenal ulceration postop day 6 11/16 Extubated. Re-intubated overnight due to respiratory distress and hypoxia with decreased mentation 11/18 Bronch, cultures sent 11/19 Hgb down getting blood 11/24 Spiked fever resume empirical antimicrobial therapy 11/26 Hemorrhagic shock, hgb 5.6, increased pressors, CT A/P  11/30 Per palliative "Candace [name] expresses understanding that patient is unfortunately very fragile despite ongoing intensive medical treatment and full mechanical support. She indicates that the family wants to continue with all current interventions despite potential outcomes". 12/08 CRRT discontinued due to clotting 12/09 Family requested transfer to [facility] denied transfer  12/10 CRRT restarted. Episodes of tachycardia, tachypnea that seem to improve with pain management 12/11 Back in shock. Pressor requirements up. CXR worse. ABX resumed 12/12 Still requiring inc pressors. Had hypoglycemic event. Basal insulin dosing adjusted 12/13 Pressor requirements better. Now hyperglycemic. Re-adjusted Glycemic control  12/14 Changed dilaudid to1/2 dosing from day further. D/c  vasopressin.  Goals of care reconfirmed with daughter.  Patient continues to desire aggressive care.  Not open to discussing any other option, patient family continues to be hopeful that she will be discharged to home with full recovery in spite of multiple attempts by staff to prepare them that this is unlikely scenario 12/15 Dilaudid discontinued sending cortisol for ongoing pressor dependence 12/16 Serum cortisol <20, added stress dose steroids.  PRN Dilaudid, attempting not go back on Dilaudid infusion 12/17 Developed worsening tachycardia during the evening hours received initially IV albumin, followed by resuming IV Dilaudid with question of suboptimal pain control.  Currently looks better back on Dilaudid drip.  We have been able to wean pressors after adding stress dose steroids; near arrest - bradycardia, better with atropine  12/19 Afebrile . Remains on dilaudid and heparin gtt, dilaudid gtt increased overnight for concern of pain / ongoing tachycardia, no other events . NE and precedex off 12/17. Ongoing CRRT- even UF, dosing lokelmia this morning 12/20 On CRRT.  Renal plans for HD cath and moving to HD. Getting wound care  12/23 -No vent weaning per RT, remains on full support, #8 trach in place but previously had #6, no documentation of change noted in chart Afebrile / WBC 20.4  Vent - 30% FiO2, PEEP 5 Glucose range 176-212 I/O 465 ml stool, 3.6L removed with HD, -1.1L in last 24 hours  RN reports ongoing periods of tachypnea / vent dyssynchrony that responds to dilaudid    12/24 - renal stopping CRRT today and plans HD 10/24/20 . 40% fio2 on vent via Trach. TAchypenic and tachycardic. Afebrile but wbc up to 27.6K. On TF. Onn heparin gtt  12/25 - Back on CRRT. On vent via trach at 40% fio2, On scheduled dilaudid as add on to oxy. Pper family request 12/24 - they felt scheduled oxy was not adequate and patien was showing  signs of opioid withdrawal.  Patient also had  worsening  SIRS/sepsis syndrome. Had fever, rising wbc, worsening RR 40-60 and HR 140s sinsu-> strated On abx yesterday. Fever some better today. WBC plateau at 28,.5K. On new levophed -> signifanct improvement in HR 77 andRR t0 20. On heparin gtt.  On precedex gtt. On levophed gtt 2mcg wthi midodrine. On TF   Consults:  Cardiology [facility] Nephrology   Procedures:  R PICC 11/5 >> A line 11/9 >> out ETT 11/9 > 11/16, 11/16 >> 09/21/2020, 09/21/2020 tracheostomy>> Lt [place] CVL 11/9 >> R IJ trialysis >> out HD catheter 12/1 >>12/20 12/21 - 14.5 Fr, 23 cm right IJ tunneled hemodialysis catheter placement.  Removal of indwelling subclavian catheter.   Significant Diagnostic Tests:  11/3 CT abd/ pelvis > 1. Positive for bowel perforation: Pneumoperitoneum and intermediate density free fluid in the abdomen. Prior total colectomy. The specific site of perforation is unclear-oral contrast present to the proximal jejunum has not obviously leaked. Note that there may be small bowel loops adherent to the ventral abdominal wall along the greater curve of the stomach. 2. Extensive bilateral lower lung pneumonia. No pleural effusion. 3. Other abdominal and pelvic viscera are stable since 2015, including bilateral adrenal adenomas. Chronic renal parapelvic cysts. 4. Aortic Atherosclerosis 11/3 TTE > EF 70-75%, RV not well visualized, mildly reduced RV systolic function 58/52 CT chest/ abd/ pelvis> 1. Interval progression of diffuse bilateral hazy ground-glass airspace opacities with more focal areas of consolidation at the lung bases 2. Trace bilateral pleural effusions. 3. Postsurgical changes the abdomen as detailed above. No evidence for a postoperative abscess, however evaluation is limited by lack of IV contrast. 4. There is a 1.9 cm cystic appearing lesion located in the pancreatic body. This was not present on the patient's CT from 2015.  Follow-up with an outpatient contrast enhanced MRI is recommended. 5. The  endometrial stripe appears diffusely thickened. Follow-up with pelvic ultrasound is recommended. Aortic Atherosclerosis 11/14 LE doppler studies > + DVT of right posterior tibial and peroneal vein, +dVT of left posterior tibial vein  11/26 CT ABD PEL > liver unremarkable, distended gallbladder with layering tiny gallstones versus sludge, no duct dilatation, mild hyperdensity right upper pole renal collecting system new.  No evidence of retroperitoneal bleeding.  Multifocal lower lobe predominant pulmonary infiltrates/pneumonia.  Small left pleural effusion.  Micro Data:  11/10 MRSA PCR > neg 11/10 BC x 2 > neg 09/22/2020 blood cultures x2>> S epi.  09/22/2020 sputum culture>> MRSA Blood 12/1 >> negative. S.epi 1 out of 2, likely contaminant BCx 2 12/12 >> neg xxx Trach aspirate 12/24 - FEW STAPHYLOCOCCUS AUREUS  Blood 12/24  Antimicrobials:  azithro 11/2 >11/3 Ceftriaxone 11/2  Fluconazole 11/3 Zosyn 11/3 >> 11/7 Vanc 11/10 off Cefepime 11/10 > 11/16 09/22/2020 vancomycin for MRSA PNA >> 12/2 xxxx Vanc 12/11> 12/17 Zosyn 12/11> 12/17 xxxx vanc 12/24 (staph resp cutlure) Zosyun 12/24 - 12/27  Subjective:    10/24/2020  - Provent score 1 year mortality expected is  > r than 92% ([email or web address].es)..   On precedex gtt, with scheduled IV push dilaudid (off scheduled oxy) and scheduled seroquel, and klonopin -> Tachycardia/Tachypnea improved but present intermittently  On  levophed gtt, heparin gtt.   On vent via trach at 40% fio2   Afebrile, WC better.REsp cultures growing staph  Objective   Blood pressure 97/60, pulse 80, temperature 97.6 F (36.4 C), temperature source Axillary, resp. rate (!) 31, height 5\' 8"  (1.727 m), weight 75.3 kg,  SpO2 99 %.    Vent Mode: PRVC FiO2 (%):  [40 %] 40 % Set Rate:  [28 bmp] 28 bmp Vt Set:  [450 mL] 450 mL PEEP:  [5 cmH20] 5 cmH20 Plateau Pressure:  [16  cmH20-26 cmH20] 25 cmH20   Intake/Output Summary (Last 24 hours) at 10/24/2020 1412 Last data filed at 10/24/2020 1200 Gross per 24 hour  Intake 3021.54 ml  Output 3070 ml  Net -48.46 ml   Filed Weights   10/22/20 0500 10/23/20 0500 10/24/20 0500  Weight: 75.1 kg 75.3 kg 75.3 kg     General Appearance:  Looks criticall ill OBESE - + Head:  Normocephalic, without obvious abnormality, atraumatic Eyes:  PERRL - yes, conjunctiva/corneas - muddy     Ears:  Normal external ear canals, both ears Nose:  G tube - yes Throat:  ETT TUBE - no , OG tube - no. TRACH Neck:  Supple,  No enlargement/tenderness/nodules Lungs: Clear to auscultation bilaterally, Ventilator   Synchrony - yes with mild tachypnea Heart:  S1 and S2 normal, no murmur, CVP - x.  Pressors - yes Abdomen:  Soft, no masses, no organomegaly Genitalia / Rectal:  Not done Extremities:  Extremities- intact Skin:  ntact in exposed areas . Sacral area - not examined Neurologic:  Sedation - as above -> RASS - -3      Resolved problems:  Previously hemorrhagic shock requiring PRBCs, Septic shock, MRSA pneumonia s/p 8 days abx Recurrent septic shock 12/11 -working dx recurrent HCAP c/b adrenal insuff - Completed 7 days of vanc and zosyn 12/17. Culture negative. 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  Has not been able to make progress with vent weaning, remains   10/24/2020 - still on PRVC. Unable to wean due to sepsis and chronic critical illess. Not fit for SBT Trials  PLAn -PRVC / lung protective ventilation  -follow intermittent CXR -VAP prevention measures  SEpsis syndrome - recurrence 10/22/20 with severe SIRS HR/RR instability and high PCT.  Likely STaph NOS - VAP  10/24/2020 - improved fever after abx. Improved RR/HR after sedation  adjustments and abx. Resp Cutlures growing staph c/w VAP  plan - vanc 12/24 - zosyn 12/24 - 12/26 stop  -await cultures 12/24 speciation   -  Circulatory shock  10/24/2020 = -continues on levophed since 10/22/20  Plan  - levophed for MAP > 65 - midodrine to continue but incrase dose to 30mg  tid - dc precedex when able - continue for now    Acute metabolic encephalopathy in the setting of renal failure, shock, sepsis  -   Followed a command for nursing. Family felt on 12/22 and 12/24 that scheduled oxy was inadequaite and patient was showing signs of opioid withdrawal from long ICU stay. Cted instability with RR/HR as rationale for that.  -> scheduled dilaudid added 12/24 along with precedex and abx and > improved HR/RR  10/24/2020 - improved/stable RR/HR  Plan Monitor without  cheduled oxycodone cotninue scheduled IV dilaudid q4h since 10/22/20 contnue versed prn coninue seroquel  -PT efforts as able  -follow neuro exam   Oliguric / Anuric ATN - CRRT started on 11/10; held 12/8; resumed on 12/10. Unable to transition to iHD (current goal) due to instabilyt  -  CRRT stopped 12.24 with plan sfor iHD 12/26 but resumed 12/25 due to sepsis syndrome and shock.  CRRT continus 12/26. Not stable enough for transition to iHD  plan -appreciate Nephrology    Duodenal ulcer perforation status post ex lap and Graham's patch  10/24/2020 - No active bleed  plan Appreciate CTS assistance and management -wound care  -Follow colostomy  -hydrotherapy per WOC   Paroxysmal A. fib, currently in sinus rhythm  10/24/2020 - HR 90  and improved significantly compaerd to 10/22/20 when HR was 140 in sinus   Plan -heparin gtt per pharmacy  -follow CBC    Indeterminate bilateral DVT -IV heparin per pharmacy   Anemia of critical illness  10/24/2020 - no active bleed  Plan  - - PRBC for hgb </= 6.9gm%    - exceptions are   -  if ACS susepcted/confirmed then transfuse for hgb </=  8.0gm%,  or    -  active bleeding with hemodynamic instability, then transfuse regardless of hemoglobin value   At at all times try to transfuse 1 unit prbc as possible with exception of active hemorrhage    Diabetes type 2 w/ marked Hyperglycemia -CBG goal 140-180 -SSI, lantus, TF coverage -improved off steroids  History of stage IV colon cancer Status post pulmonary [facility]atectomies at [facility] + Chemo 1.9 cm cystic appearing lesion located in the pancreatic body -  Plan will need ONC follow up if survives critical illness   Best practice:  Diet: TF Pain/Anxiety/Delirium protocol (if indicated) dilaudid gtt/ enteral sedation-  clonazepam, oxy, seroquel VAP protocol (if indicated): yes DVT prophylaxis: IV heparin GI prophylaxis: Protonix Glucose control: SSI, TF coverage, lantus Mobility: Bedrest  Goals of Care:  Last date of multidisciplinary goals of care discussion:12/14 and then again 10/22/20  Family and staff present:  nurse practitioner, bedside nurse, and the patient's daughter  Summary of discussion:  There have been no changes in goals of care, family continues to desire any and all therapies, remains full code, family remains hopeful that she will survive and be discharged to home Follow up goals of care discussion due: no change in goals of care.    Code Status: Full    Multi-Disciplinary Goals of Care Discussion Date of Discussion 10/22/2020 and is Day 66 since admit  Primary service for patient ccm  Location of discussion Bedside [id number]  Family and Staff present Autumn RN, Chase Caller MD, Candace [name] DPOA over video and one oother daughter at bedside  Summary of discussion MOst of discussion around SIRS (high HR and RR)  Followup goals of care due by 10/29/20  Misc comments if any continue full active care and full code    Family updates -> Daughter Candace [name] [phone]   - - updated 10/23/20. Daughter acknowledged that patient better today. She expressed  concern that patient was very unstable yesterday and this has contributed to setback and pressor need. Says RN could not get temperature yesterday and was asking what protocols in place for not being able to record temp. Advised to talk to [place]   The patient Candace [name] is critically ill with multiple organ systems failure and requires high complexity decision making for assessment and support, frequent evaluation and titration of therapies, application of advanced monitoring technologies and extensive interpretation of multiple databases.   Critical Care Time devoted to patient care services described in this note is  40  Minutes. This time reflects time of care of this signee Dr Brand Males. This critical care time does not reflect procedure time, or teaching time or  supervisory time of PA/NP/Med student/Med Resident etc but could involve care discussion time     Dr. Brand Males, M.D., [facility].C.P Pulmonary and Critical Care Medicine Staff Physician [facility] Pager: [phone], If no answer or between  15:00h - 7:00h: call [phone]  10/24/2020 2:30 PM      LABS    PULMONARY Recent Labs  Lab 10/18/20 0507 10/22/20 1441  PHART 7.387 7.416  PCO2ART 42.4 38.3  PO2ART 105 67*  HCO3 24.9 24.4  TCO2  --  26  O2SAT 98.4 93.0    CBC Recent Labs  Lab 10/22/20 1736 10/23/20 0454 10/24/20 0459  HGB 7.6* 7.3* 7.0*  HCT 25.3* 24.3* 23.9*  WBC 27.2* 28.5* 18.7*  PLT 329 314 286    COAGULATION No results for input(s): INR in the last 168 hours.  CARDIAC  No results for input(s): TROPONINI in the last 168 hours. No results for input(s): PROBNP in the last 168 hours.   CHEMISTRY Recent Labs  Lab 10/20/20 0350 10/20/20 1633 10/21/20 0308 10/21/20 1609 10/22/20 0329 10/22/20 1441 10/22/20 1736 10/23/20 0454 10/23/20 1442 10/24/20 0459  NA 132*   < > 132* 130* 131* 132*  129* 132* 131* 133*  K 4.4   < > 4.9 4.7 4.5 5.2* 6.0* 5.2* 5.2* 5.0  CL 98   < > 98 96* 96*  --  96* 99 98 100  CO2 25   < > 25 21* 24  --  22 22 22 23   GLUCOSE 161*   < > 241* 364* 194*  --  179* 161* 242* 128*  BUN 37*   < > 39* 40* 38*  --  76* 72* 67* 53*  CREATININE 0.44   < > 0.48 0.51 0.47  --  0.81 0.79 0.71 0.65  CALCIUM 10.1   < > 10.6* 11.2* 11.5*  --  12.6* 11.1* 10.9* 10.5*  MG 2.8*  --  2.5*  --  2.6*  --   --  2.6*  --  2.6*  PHOS 2.8   < > 2.9 4.8* 3.8  --   --  4.9* 4.6 4.3   < > = values in this interval not displayed.   Estimated Creatinine Clearance: 67 mL/min (by C-G formula based on SCr of 0.65 mg/dL).   LIVER Recent Labs  Lab 10/19/20 1836 10/20/20 0350 10/21/20 1609 10/22/20 0329 10/23/20 0454 10/23/20 1442 10/24/20 0459  AST 74*  --   --   --   --   --   --   ALT 85*  --   --   --   --   --   --   ALKPHOS 332*  --   --   --   --   --   --   BILITOT 0.6  --   --   --   --   --   --   PROT 7.4  --   --   --   --   --   --   ALBUMIN 2.4*   < > 2.4* 2.4* 2.4* 2.3* 2.2*   < > = values in this interval not displayed.     INFECTIOUS Recent Labs  Lab 10/19/20 1836 10/22/20 1248  LATICACIDVEN 1.6 1.4  PROCALCITON  --  4.96     ENDOCRINE CBG (last 3)  Recent Labs    10/24/20 0319 10/24/20 0746 10/24/20 1124  GLUCAP 98 135* 164*

## 2020-10-24 NOTE — Progress Notes (Signed)
Patient ID: Candace [name], female   DOB: 27-May-1951, 69 y.o.   MRN: [id number]    S:  Stable overnight    O:BP 106/66   Pulse 77   Temp (!) 97.4 F (36.3 C) (Axillary)   Resp (!) 27   Ht 5' 8"  (1.727 m)   Wt 75.3 kg   SpO2 99%   BMI 25.24 kg/m   Intake/Output Summary (Last 24 hours) at 10/24/2020 0957 Last data filed at 10/24/2020 0900 Gross per 24 hour  Intake 3277.76 ml  Output 3224 ml  Net 53.76 ml   Intake/Output: I/O last 3 completed shifts: In: 5199.6 [I.V.:1531.8; NG/GT:2935; IV Piggyback:732.8] Out: 1886 [Other:3551; LRJPV:6681]  Intake/Output this shift:  Total I/O In: 222.5 [I.V.:92.5; NG/GT:130] Out: 160 [Other:160] Weight change: 0 kg Gen: on vent via trach, flat affect- no commands  CVS: tachy at 119 Resp: scattered rhonchi Abd: +BS, soft, ostomy in RLQ Ext: trace dependent edema  Recent Labs  Lab 10/19/20 1836 10/20/20 0350 10/20/20 1633 10/21/20 0308 10/21/20 1609 10/22/20 0329 10/22/20 1441 10/22/20 1736 10/23/20 0454 10/23/20 1442 10/24/20 0459  NA  --    < > 133* 132* 130* 131* 132* 129* 132* 131* 133*  K  --    < > 4.6 4.9 4.7 4.5 5.2* 6.0* 5.2* 5.2* 5.0  CL  --    < > 99 98 96* 96*  --  96* 99 98 100  CO2  --    < > 26 25 21* 24  --  22 22 22 23   GLUCOSE  --    < > 169* 241* 364* 194*  --  179* 161* 242* 128*  BUN  --    < > 40* 39* 40* 38*  --  76* 72* 67* 53*  CREATININE  --    < > 0.48 0.48 0.51 0.47  --  0.81 0.79 0.71 0.65  ALBUMIN 2.4*   < > 2.4* 2.4* 2.4* 2.4*  --   --  2.4* 2.3* 2.2*  CALCIUM  --    < > 10.3 10.6* 11.2* 11.5*  --  12.6* 11.1* 10.9* 10.5*  PHOS  --    < > 2.8 2.9 4.8* 3.8  --   --  4.9* 4.6 4.3  AST 74*  --   --   --   --   --   --   --   --   --   --   ALT 85*  --   --   --   --   --   --   --   --   --   --    < > = values in this interval not displayed.   Liver Function Tests: Recent Labs  Lab 10/19/20 1836 10/20/20 0350 10/23/20 0454 10/23/20 1442 10/24/20 0459  AST 74*  --   --   --   --   ALT 85*   --   --   --   --   ALKPHOS 332*  --   --   --   --   BILITOT 0.6  --   --   --   --   PROT 7.4  --   --   --   --   ALBUMIN 2.4*   < > 2.4* 2.3* 2.2*   < > = values in this interval not displayed.   No results for input(s): LIPASE, AMYLASE in the last 168 hours. No results for input(s): AMMONIA in the last  168 hours. CBC: Recent Labs  Lab 10/21/20 0308 10/22/20 0329 10/22/20 1441 10/22/20 1736 10/23/20 0454 10/24/20 0459  WBC 20.4* 27.6*  --  27.2* 28.5* 18.7*  HGB 8.1* 8.5*   < > 7.6* 7.3* 7.0*  HCT 27.4* 27.0*   < > 25.3* 24.3* 23.9*  MCV 96.8 95.1  --  95.8 96.0 96.4  PLT 323 320  --  329 314 286   < > = values in this interval not displayed.   Cardiac Enzymes: Recent Labs  Lab 10/22/20 1248  CKTOTAL 26*  CKMB 15.4*   CBG: Recent Labs  Lab 10/23/20 1557 10/23/20 1938 10/23/20 2340 10/24/20 0319 10/24/20 0746  GLUCAP 189* 208* 134* 98 135*    Iron Studies: No results for input(s): IRON, TIBC, TRANSFERRIN, FERRITIN in the last 72 hours. Studies/Results: DG CHEST PORT 1 VIEW  Result Date: 10/22/2020 CLINICAL DATA:  Tachypnea, decreased oxygen saturation, hypotension, COVID-19 positive EXAM: PORTABLE CHEST 1 VIEW COMPARISON:  Portable exam 1551 hours compared to 10/20/2020 FINDINGS: Tracheostomy tube, feeding tube, and RIGHT jugular dialysis catheter unchanged. Normal heart size mediastinal contours. Patchy airspace infiltrates in the mid to lower lungs again seen. No pleural effusion or pneumothorax. LEFT glenohumeral degenerative changes. IMPRESSION: Persistent BILATERAL pulmonary infiltrates consistent with multifocal pneumonia and history of COVID-19. Electronically Signed   By: Lavonia Dana M.D.   On: 10/22/2020 16:03   . B-complex with vitamin C  1 tablet Per Tube Daily  . chlorhexidine gluconate (MEDLINE KIT)  15 mL Mouth Rinse BID  . Chlorhexidine Gluconate Cloth  6 each Topical Daily  . cholecalciferol  2,000 Units Per Tube Daily  . clonazepam  1 mg Per  Tube BID  . collagenase   Topical Daily  . [START ON 10/26/2020] darbepoetin (ARANESP) injection - DIALYSIS  100 mcg Subcutaneous Q Tue-1800  . docusate  100 mg Per Tube BID  . feeding supplement (PROSource TF)  90 mL Per Tube TID  . guaiFENesin  15 mL Per Tube Q6H  .  HYDROmorphone (DILAUDID) injection  1 mg Intravenous Q4H  . insulin aspart  0-15 Units Subcutaneous Q4H  . insulin aspart  7 Units Subcutaneous Q4H  . insulin glargine  40 Units Subcutaneous Daily  . mouth rinse  15 mL Mouth Rinse QID  . midodrine  20 mg Per Tube TID WC  . nutrition supplement (JUVEN)  1 packet Per Tube BID BM  . pantoprazole sodium  40 mg Per Tube BID  . polyethylene glycol  17 g Per Tube Daily  . QUEtiapine  100 mg Per Tube BID  . sodium chloride flush  10-40 mL Intracatheter Q12H    BMET    Component Value Date/Time   NA 133 (L) 10/24/2020 0459   K 5.0 10/24/2020 0459   CL 100 10/24/2020 0459   CO2 23 10/24/2020 0459   GLUCOSE 128 (H) 10/24/2020 0459   BUN 53 (H) 10/24/2020 0459   CREATININE 0.65 10/24/2020 0459   CALCIUM 10.5 (H) 10/24/2020 0459   GFRNONAA >60 10/24/2020 0459   CBC    Component Value Date/Time   WBC 18.7 (H) 10/24/2020 0459   RBC 2.48 (L) 10/24/2020 0459   HGB 7.0 (L) 10/24/2020 0459   HCT 23.9 (L) 10/24/2020 0459   PLT 286 10/24/2020 0459   MCV 96.4 10/24/2020 0459   MCH 28.2 10/24/2020 0459   MCHC 29.3 (L) 10/24/2020 0459   RDW 16.4 (H) 10/24/2020 0459   LYMPHSABS 0.4 (L) 10/04/2020 0516   MONOABS  0.2 10/04/2020 0516   EOSABS 0.0 10/04/2020 0516   BASOSABS 0.0 10/04/2020 0516     Assessment/Plan:  1. AKI- oliguric presumably due to ischemic ATN in setting of hemorrhagic shock. Started on CRRT 09/08/20 due to persistent oliguria and hyperkalemia. 1. All fluids 4K/2.5Ca: Pre-filter to 500 ml/hr, post-filter 200 ml/hr, and dialysate to 1000 ml/hr.  2. Keeping even. 3. No anticoagulation due to thrombocytopenia and ongoing GI blood loss. Continue with  current CRRT settings. Pt has deteriorated several times when trying to transition to IHD but had been a week since we last tried-  Tried again 12/24 which unfortunately failed again- only lasted 12 hours.... this is an issue-  Not sure when we will be able to transition her- cannot keep her on CRRT forever  4. RIJ TDC placed 10/19/20 by IR 2. Hemorrhagic shock- ongoing blood loss and requiring pressors. 1. Transfuse as needed-  Have added ESA  3. Septic shock due to MRSA pneumonia. 4. Perforated duodenal ulcer- s/p exploratory lap and Graham patch placement per surgery. 5. Acute hypoxic/hypercapnic respiratory failure due to ARDS from Recent Covid-19 PNA- currently on vent via trach per PCCM. low o2 6. P. Atrial fibrillation- no anticoagulation due to GI bleed. 7. Hyponatremia- worsened quickly off of CRRT- now better  8. Severe protein malnutrition- per primary svc. 9. Disposition- poor overall prognosis and will likely require LTAC and chronic vent support if we can ever get her transitioned to IHD. PCCM to have ongoing goals of care meetings with family.   Louis Meckel  [employer] [phone]

## 2020-10-25 DIAGNOSIS — J9620 Acute and chronic respiratory failure, unspecified whether with hypoxia or hypercapnia: Secondary | ICD-10-CM | POA: Diagnosis not present

## 2020-10-25 LAB — CBC
HCT: 24.7 % — ABNORMAL LOW (ref 36.0–46.0)
Hemoglobin: 7.3 g/dL — ABNORMAL LOW (ref 12.0–15.0)
MCH: 28.3 pg (ref 26.0–34.0)
MCHC: 29.6 g/dL — ABNORMAL LOW (ref 30.0–36.0)
MCV: 95.7 fL (ref 80.0–100.0)
Platelets: 285 10*3/uL (ref 150–400)
RBC: 2.58 MIL/uL — ABNORMAL LOW (ref 3.87–5.11)
RDW: 16.2 % — ABNORMAL HIGH (ref 11.5–15.5)
WBC: 16.4 10*3/uL — ABNORMAL HIGH (ref 4.0–10.5)
nRBC: 1.1 % — ABNORMAL HIGH (ref 0.0–0.2)

## 2020-10-25 LAB — RENAL FUNCTION PANEL
Albumin: 2 g/dL — ABNORMAL LOW (ref 3.5–5.0)
Albumin: 1.9 g/dL — ABNORMAL LOW (ref 3.5–5.0)
Anion gap: 10 (ref 5–15)
Anion gap: 10 (ref 5–15)
BUN: 35 mg/dL — ABNORMAL HIGH (ref 8–23)
BUN: 43 mg/dL — ABNORMAL HIGH (ref 8–23)
CO2: 22 mmol/L (ref 22–32)
CO2: 23 mmol/L (ref 22–32)
Calcium: 10 mg/dL (ref 8.9–10.3)
Calcium: 9.5 mg/dL (ref 8.9–10.3)
Chloride: 101 mmol/L (ref 98–111)
Chloride: 101 mmol/L (ref 98–111)
Creatinine, Ser: 0.54 mg/dL (ref 0.44–1.00)
Creatinine, Ser: 0.51 mg/dL (ref 0.44–1.00)
GFR, Estimated: >60 mL/min (ref >60)
GFR, Estimated: >60 mL/min (ref >60)
Glucose, Bld: 114 mg/dL — ABNORMAL HIGH (ref 70–99)
Glucose, Bld: 221 mg/dL — ABNORMAL HIGH (ref 70–99)
Phosphorus: 3.3 mg/dL (ref 2.5–4.6)
Phosphorus: 3 mg/dL (ref 2.5–4.6)
Potassium: 4.7 mmol/L (ref 3.5–5.1)
Potassium: 4.8 mmol/L (ref 3.5–5.1)
Sodium: 133 mmol/L — ABNORMAL LOW (ref 135–145)
Sodium: 134 mmol/L — ABNORMAL LOW (ref 135–145)

## 2020-10-25 LAB — HEPARIN LEVEL (UNFRACTIONATED): Heparin Unfractionated: 0.5 IU/mL (ref 0.30–0.70)

## 2020-10-25 LAB — CULTURE, RESPIRATORY W GRAM STAIN

## 2020-10-25 LAB — GLUCOSE, CAPILLARY
Glucose-Capillary: 102 mg/dL — ABNORMAL HIGH (ref 70–99)
Glucose-Capillary: 154 mg/dL — ABNORMAL HIGH (ref 70–99)
Glucose-Capillary: 162 mg/dL — ABNORMAL HIGH (ref 70–99)
Glucose-Capillary: 171 mg/dL — ABNORMAL HIGH (ref 70–99)
Glucose-Capillary: 188 mg/dL — ABNORMAL HIGH (ref 70–99)
Glucose-Capillary: 203 mg/dL — ABNORMAL HIGH (ref 70–99)
Glucose-Capillary: 92 mg/dL (ref 70–99)

## 2020-10-25 LAB — MAGNESIUM: Magnesium: 2.5 mg/dL — ABNORMAL HIGH (ref 1.7–2.4)

## 2020-10-25 MED ORDER — DAKINS (1/4 STRENGTH) 0.125 % EX SOLN
Freq: Two times a day (BID) | CUTANEOUS | Status: AC
  Filled 2020-10-25: qty 473

## 2020-10-25 MED ORDER — COLLAGENASE 250 UNIT/GM EX OINT
TOPICAL_OINTMENT | Freq: Every day | CUTANEOUS | Status: DC
  Filled 2020-10-25: qty 30

## 2020-10-25 MED ORDER — CEFAZOLIN SODIUM-DEXTROSE 2-4 GM/100ML-% IV SOLN
2.0000 g | Freq: Two times a day (BID) | INTRAVENOUS | Status: DC
  Administered 2020-10-25 – 2020-10-30 (×10): 2 g via INTRAVENOUS
  Filled 2020-10-25 (×12): qty 100

## 2020-10-25 NOTE — Progress Notes (Signed)
[facility] NEPHROLOGY PROGRESS NOTE  Assessment/ Plan:  # AKI- oliguric presumably due to ischemic ATN in setting of hemorrhagic shock. Started on CRRT 09/08/20 due to persistent oliguria and hyperkalemia. All fluids 4K/2.5Ca: Pre-filter to 500 ml/hr, post-filter 200 ml/hr, and dialysate to 1000 ml/hr. Keeping even. No anticoagulation due to thrombocytopenia and ongoing GI blood loss.  Continue with current CRRT settings.Pt has deteriorated several times when trying to transition to IHD but had been a week since we last tried-  Tried again 12/24 which unfortunately failed again- only lasted 12 hours.... this is an issue-  Not sure when we will be able to transition her- cannot keep her on CRRT forever. RIJ TDC placed 10/19/20 by IR.  # Hemorrhagic shock- ongoing blood loss and requiring pressors. Transfuse as needed, have added ESA.  # Septic shock due to MRSA pneumonia.  # Perforated duodenal ulcer- s/p exploratory lap and Graham patch placement per surgery.  # Acute hypoxic/hypercapnic respiratory failure due to ARDS from Recent Covid-19 PNA- currently on vent via trach per PCCM. low o2.  # P. Atrial fibrillation- no anticoagulation due to GI bleed.  # Hyponatremia- worsened quickly off of CRRT- now better, manage with crrt.   # Severe protein malnutrition- per primary svc.  # Disposition- poor overall prognosis and will likely require LTAC and chronic vent support if we can ever get her transitioned to IHD. PCCM tohave ongoinggoals of care [place] [facility].  Subjective: Seen and examined in ICU.  Tolerating CRRT well.  Blood pressure low requiring Levophed.  No issue with the filter.  No new event. Objective Vital signs in last 24 hours: Vitals:   10/25/20 0830 10/25/20 0900 10/25/20 0930 10/25/20 1000  BP: 111/67 100/68 (!) 92/50 (!) 96/54  Pulse: (!) 122 (!) 111 (!) 101 (!) 129  Resp: (!) 38 (!) 36 (!) 24 (!) 38  Temp:      TempSrc:      SpO2:  90% 96% 99% 96%  Weight:      Height:       Weight change: -0.4 kg  Intake/Output Summary (Last 24 hours) at 10/25/2020 1042 Last data filed at 10/25/2020 1000 Gross per 24 hour  Intake 2964.38 ml  Output 3093 ml  Net -128.62 ml       Labs: Basic Metabolic Panel: Recent Labs  Lab 10/24/20 0459 10/24/20 1725 10/25/20 0506  NA 133* 132* 133*  K 5.0 4.9 4.7  CL 100 98 101  CO2 23 24 22   GLUCOSE 128* 159* 114*  BUN 53* 45* 35*  CREATININE 0.65 0.58 0.54  CALCIUM 10.5* 9.8 10.0  PHOS 4.3 3.6 3.3   Liver Function Tests: Recent Labs  Lab 10/19/20 1836 10/20/20 0350 10/24/20 0459 10/24/20 1725 10/25/20 0506  AST 74*  --   --   --   --   ALT 85*  --   --   --   --   ALKPHOS 332*  --   --   --   --   BILITOT 0.6  --   --   --   --   PROT 7.4  --   --   --   --   ALBUMIN 2.4*   < > 2.2* 2.1* 2.0*   < > = values in this interval not displayed.   No results for input(s): LIPASE, AMYLASE in the last 168 hours. No results for input(s): AMMONIA in the last 168 hours. CBC: Recent Labs  Lab 10/22/20 0329 10/22/20  1441 10/22/20 1736 10/23/20 0454 10/24/20 0459 10/25/20 0506  WBC 27.6*  --  27.2* 28.5* 18.7* 16.4*  HGB 8.5*   < > 7.6* 7.3* 7.0* 7.3*  HCT 27.0*   < > 25.3* 24.3* 23.9* 24.7*  MCV 95.1  --  95.8 96.0 96.4 95.7  PLT 320  --  329 314 286 285   < > = values in this interval not displayed.   Cardiac Enzymes: Recent Labs  Lab 10/22/20 1248  CKTOTAL 26*  CKMB 15.4*   CBG: Recent Labs  Lab 10/24/20 1551 10/24/20 1924 10/24/20 2307 10/25/20 0358 10/25/20 0758  GLUCAP 155* 129* 92 154* 102*    Iron Studies: No results for input(s): IRON, TIBC, TRANSFERRIN, FERRITIN in the last 72 hours. Studies/Results: No results found.  Medications: Infusions: .  prismasol BGK 4/2.5 500 mL/hr at 10/24/20 1708  .  prismasol BGK 4/2.5 200 mL/hr at 10/25/20 0219  . sodium chloride 10 mL/hr at 10/25/20 1000  . sodium chloride    .  ceFAZolin (ANCEF) IV     . dexmedetomidine (PRECEDEX) IV infusion 0.6 mcg/kg/hr (10/25/20 1000)  . feeding supplement (VITAL 1.5 CAL) 1,000 mL (10/24/20 2123)  . heparin 1,300 Units/hr (10/25/20 1000)  . norepinephrine (LEVOPHED) Adult infusion 1 mcg/min (10/25/20 1000)  . prismasol BGK 4/2.5 1,000 mL/hr at 10/25/20 2518    Scheduled Medications: . B-complex with vitamin C  1 tablet Per Tube Daily  . chlorhexidine gluconate (MEDLINE KIT)  15 mL Mouth Rinse BID  . Chlorhexidine Gluconate Cloth  6 each Topical Daily  . cholecalciferol  2,000 Units Per Tube Daily  . clonazepam  1 mg Per Tube BID  . [START ON 10/28/2020] collagenase   Topical Daily  . [START ON 10/26/2020] darbepoetin (ARANESP) injection - DIALYSIS  100 mcg Subcutaneous Q Tue-1800  . docusate  100 mg Per Tube BID  . feeding supplement (PROSource TF)  90 mL Per Tube TID  .  HYDROmorphone (DILAUDID) injection  1 mg Intravenous Q4H  . insulin aspart  0-15 Units Subcutaneous Q4H  . insulin aspart  7 Units Subcutaneous Q4H  . insulin glargine  40 Units Subcutaneous Daily  . mouth rinse  15 mL Mouth Rinse QID  . midodrine  30 mg Per Tube TID WC  . nutrition supplement (JUVEN)  1 packet Per Tube BID BM  . pantoprazole sodium  40 mg Per Tube BID  . polyethylene glycol  17 g Per Tube Daily  . QUEtiapine  100 mg Per Tube BID  . sodium chloride flush  10-40 mL Intracatheter Q12H  . sodium hypochlorite   Topical BID    have reviewed scheduled and prn medications.  Physical Exam: General: Critically ill looking female with a tracheostomy, following simple command Heart:RRR, s1s2 nl Lungs: Coarse breath sound bilateral Abdomen:soft,  non-distended Extremities:No edema Dialysis Access: Right IJ TDC  Braddock Servellon Prasad Malin Cervini 10/25/2020,10:42 AM  LOS: 14 days  Pager: [phone]

## 2020-10-25 NOTE — Progress Notes (Signed)
Physical Therapy Treatment Patient Details Name: Candace [name] MRN: [id number] DOB: 11/24/1950 Today's Date: 10/25/2020    History of Present Illness 69 y.o. female with medical history significant for recent covid pna, obesity, colon cancer s/p colon resection with colostomy bag, HLD, NIDDM2, CKD3 presented to ED after her follow-up nurse advised her to present to ED for elevated HR. +afib, elevated troponins with demand ischemia,  CT scan is positive for bowel perforation with pneumomediastinum 11/03 exp lap with repair of perforated ulcer. 11/10 t/f to ICU- intubated/sedated/proned. Trach placed 11/24. CRRT off 12/8, restarted 12/10.    PT Comments    Pt minimally responsive today. Eyes open 50% of session. Followed simple commands 25% of trials. Transitioned to sitting total assist with bed to chair position. Tolerated chair position x 4 minutes. Increasing L lateral lean, total assist to reposition to midline with eventual requirement of return to supine. PROM performed BUE/LE.    Follow Up Recommendations  LTACH;Supervision/Assistance - 24 hour     Equipment Recommendations  Wheelchair (measurements PT);Wheelchair cushion (measurements PT);Hospital bed;Other (comment)    Recommendations for Other Services       Precautions / Restrictions Precautions Precautions: Fall Precaution Comments: baseline colostomy; trach-vent; CRRT; A line, sacral wound, warming blanket    Mobility  Bed Mobility Overal bed mobility: Needs Assistance Bed Mobility: Supine to Sit;Sit to Supine     Supine to sit: Total assist Sit to supine: Total assist   General bed mobility comments: Transitioned to chair position using bed. L lateral lean in sitting requiring total assist to reposition to midline.  Transfers                    Ambulation/Gait                 Stairs             Wheelchair Mobility    Modified Rankin (Stroke Patients Only)       Balance Overall  balance assessment: Needs assistance   Sitting balance-Leahy Scale: Zero Sitting balance - Comments: bed in chair position x 4 minutes. Required return to supine due to L lateral lean.                                    Cognition Arousal/Alertness: Awake/alert Behavior During Therapy: Flat affect Overall Cognitive Status: Difficult to assess                                 General Comments: Eyes open 60% of time. Followed one-step commands 25% of trials.      Exercises Other Exercises Other Exercises: PROM BUE/LE    General Comments General comments (skin integrity, edema, etc.): BP supine 109/68 sitting 110/75. Resting HR 106. Max HR 131. RR 25-40. SpO2 96-100% vent via trach 40% FiO2.      Pertinent Vitals/Pain Pain Assessment: Faces Faces Pain Scale: No hurt    Home Living                      Prior Function            PT Goals (current goals can now be found in the care plan section) Acute Rehab PT Goals Patient Stated Goal: not able to state Progress towards PT goals: Progressing toward goals    Frequency  Min 2X/week      PT Plan Current plan remains appropriate    Co-evaluation              AM-PAC PT "6 Clicks" Mobility   Outcome Measure  Help needed turning from your back to your side while in a flat bed without using bedrails?: Total Help needed moving from lying on your back to sitting on the side of a flat bed without using bedrails?: Total Help needed moving to and from a bed to a chair (including a wheelchair)?: Total Help needed standing up from a chair using your arms (e.g., wheelchair or bedside chair)?: Total Help needed to walk in hospital room?: Total Help needed climbing 3-5 steps with a railing? : Total 6 Click Score: 6    End of Session Equipment Utilized During Treatment: Oxygen Activity Tolerance: Patient limited by fatigue Patient left: in bed;with call bell/phone within reach Nurse  Communication: Mobility status;Other (comment) (need for trach suctioning) PT Visit Diagnosis: Muscle weakness (generalized) (M62.81);Difficulty in walking, not elsewhere classified (R26.2);Pain;Adult, failure to thrive (R62.7);Unsteadiness on feet (R26.81)     Time: 5465-6812 PT Time Calculation (min) (ACUTE ONLY): 25 min  Charges:  $Therapeutic Exercise: 8-22 mins $Therapeutic Activity: 8-22 mins                     Lorrin Goodell, PT  Office # [phone] Pager [phone]    [name] 10/25/2020, 8:59 AM

## 2020-10-25 NOTE — Progress Notes (Signed)
Physical Therapy Wound Treatment Patient Details  Name: Candace [name] MRN: [id number] Date of Birth: October 19, 1951  Today's Date: 10/25/2020 Time: 4158-3094 Time Calculation (min): 41 min  Subjective  Subjective: Lethargic on vent with intermittent eyes open. Patient and Family Stated Goals: Heal wound; maintain pt comfort per daughter Date of Onset:  (Unknown)  Pain Score:  Pt premedicated and tolerated treatment without indications of pain.   Wound Assessment  Pressure Injury 09/25/20 Sacrum Bilateral;Medial Deep Tissue Pressure Injury - Purple or maroon localized area of discolored intact skin or blood-filled blister due to damage of underlying soft tissue from pressure and/or shear. Purple, non-blanchable, b (Active)  Dressing Type ABD;Barrier Film (skin prep);Gauze (Comment);Moist to dry 10/25/20 0900  Dressing Clean;Intact;New drainage;Changed 10/25/20 0900  Dressing Change Frequency Daily 10/25/20 0900  State of Healing Eschar 10/25/20 0900  Site / Wound Assessment Red;Yellow;Black 10/25/20 0900  % Wound base Red or Granulating 20% 10/25/20 0900  % Wound base Yellow/Fibrinous Exudate 60% 10/25/20 0900  % Wound base Black/Eschar 20% 10/25/20 0900  % Wound base Other/Granulation Tissue (Comment) 0% 10/25/20 0900  Peri-wound Assessment Bleeding;Maceration 10/25/20 0900  Wound Length (cm) 16 cm 10/20/20 0941  Wound Width (cm) 11 cm 10/20/20 0941  Wound Depth (cm) 4 cm 10/20/20 0941  Wound Surface Area (cm^2) 176 cm^2 10/20/20 0941  Wound Volume (cm^3) 704 cm^3 10/20/20 0941  Tunneling (cm) 0 10/13/20 1200  Undermining (cm) 0 10/13/20 1200  Margins Unattached edges (unapproximated) 10/25/20 0900  Drainage Amount Minimal 10/25/20 0900  Drainage Description Serosanguineous 10/25/20 0900  Treatment Debridement (Selective);Hydrotherapy (Pulse lavage);Packing (Saline gauze) 10/25/20 0900   Santyl applied to wound bed prior to applying dressing.     Hydrotherapy Pulsed lavage therapy  - wound location: sacrum Pulsed Lavage with Suction (psi): 12 psi Pulsed Lavage with Suction - Normal Saline Used: 1000 mL Pulsed Lavage Tip: Tip with splash shield Selective Debridement Selective Debridement - Location: sacrum Selective Debridement - Tools Used: Forceps;Scalpel Selective Debridement - Tissue Removed: eschar; non-viable tissue   Wound Assessment and Plan  Wound Therapy - Assess/Plan/Recommendations Wound Therapy - Clinical Statement: Assessed wound with WOC-RN's today and will start Dakin's solution tomorrow for hydrotherapy and continue to see daily at least the duration of the Dakin's order. Santyl applied this session. Will continue to follow for selective removal of unviable tissue, to decrease bioburden, and promote wound bed healing. Wound Therapy - Functional Problem List: Global weakness and immobility Factors Delaying/Impairing Wound Healing: Diabetes Mellitus;Immobility;Multiple medical problems Hydrotherapy Plan: Debridement;Dressing change;Patient/family education;Pulsatile lavage with suction Wound Therapy - Frequency: 6X / week Wound Therapy - Follow Up Recommendations: Skilled nursing facility Wound Plan: See above  Wound Therapy Goals- Improve the function of patient's integumentary system by progressing the wound(s) through the phases of wound healing (inflammation - proliferation - remodeling) by: Decrease Necrotic Tissue to: 20% Decrease Necrotic Tissue - Progress: Progressing toward goal Increase Granulation Tissue to: 80% Increase Granulation Tissue - Progress: Progressing toward goal Goals/treatment plan/discharge plan were made with and agreed upon by patient/family: No, Patient unable to participate in goals/treatment/discharge plan and family unavailable Time For Goal Achievement: 7 days Wound Therapy - Potential for Goals: Fair  Goals will be updated until maximal potential achieved or discharge criteria met.  Discharge criteria: when goals  achieved, discharge from hospital, MD decision/surgical intervention, no progress towards goals, refusal/missing three consecutive treatments without notification or medical reason.  GP     Thelma Comp 10/25/2020, 10:01 AM   Rolinda Roan, PT, DPT Acute  Rehabilitation Services Pager: [phone] Office: [phone]

## 2020-10-25 NOTE — Progress Notes (Addendum)
NAMEDejae [name], MRN:  [id number], DOB:  June 14, 1951, LOS: 2 ADMISSION DATE:  08/31/2020, CONSULTATION DATE:  09/07/2020 REFERRING MD:  Dr Candace [name], CHIEF COMPLAINT:  Acute resp failure  Brief History   69 year old female who was previously diagnosed with Covid 08/23/2020.  Admitted 11/2 with AF-RVR, found to have a perforated duodenal underwent exploratory laparotomy with Phillip Heal patch placement.  Past Medical History  Covid pneumonia Atrial fibrillation CKD stage III Diabetes mellitus Hypertension Colon cancer Hyperlipidemia  Significant Hospital Events   11/2 Admitted  11/3 OR with findings of perforated duodenal ulcer 11/10 progressive hemorrhagic shock, intubated, transfused, pressors, proned; started on CRRT in PM 11/03 Exploratory laparotomy, Phillip Heal patch, lysis of adhesion for duodenal ulceration postop day 6 11/16 Extubated. Re-intubated overnight due to respiratory distress and hypoxia with decreased mentation 11/18 Bronch, cultures sent 11/19 Hgb down getting blood 11/24 Spiked fever resume empirical antimicrobial therapy 11/26 Hemorrhagic shock, hgb 5.6, increased pressors, CT A/P  11/30 Per palliative "Candace [name] expresses understanding that patient is unfortunately very fragile despite ongoing intensive medical treatment and full mechanical support. She indicates that the family wants to continue with all current interventions despite potential outcomes". 12/08 CRRT discontinued due to clotting 12/09 Family requested transfer to tertiary care [facility]). [facility] denied transfer  12/10 CRRT restarted. Episodes of tachycardia, tachypnea that seem to improve with pain management 12/11 Back in shock. Pressor requirements up. CXR worse. ABX resumed 12/12 Still requiring inc pressors. Had hypoglycemic event. Basal insulin dosing adjusted 12/13 Pressor requirements better. Now hyperglycemic. Re-adjusted Glycemic control  12/14 Changed dilaudid to1/2 dosing from day further. D/c  vasopressin.  Goals of care reconfirmed with daughter.  Patient continues to desire aggressive care.  Not open to discussing any other option, patient family continues to be hopeful that she will be discharged to home with full recovery in spite of multiple attempts by staff to prepare them that this is unlikely scenario 12/15 Dilaudid discontinued sending cortisol for ongoing pressor dependence 12/16 Serum cortisol <20, added stress dose steroids.  PRN Dilaudid, attempting not go back on Dilaudid infusion 12/17 Developed worsening tachycardia during the evening hours received initially IV albumin, followed by resuming IV Dilaudid with question of suboptimal pain control.  Currently looks better back on Dilaudid drip.  We have been able to wean pressors after adding stress dose steroids; near arrest - bradycardia, better with atropine  12/19 Afebrile . Remains on dilaudid and heparin gtt, dilaudid gtt increased overnight for concern of pain / ongoing tachycardia, no other events . NE and precedex off 12/17. Ongoing CRRT- even UF, dosing lokelmia this morning 12/20 On CRRT.  Renal plans for HD cath and moving to HD. Getting wound care  12/23 -No vent weaning per RT, remains on full support, #8 trach in place but previously had #6, no documentation of change noted in chart Afebrile / WBC 20.4  Vent - 30% FiO2, PEEP 5 Glucose range 176-212 I/O 465 ml stool, 3.6L removed with HD, -1.1L in last 24 hours  RN reports ongoing periods of tachypnea / vent dyssynchrony that responds to dilaudid    12/24 - renal stopping CRRT today and plans HD 10/24/20 . 40% fio2 on vent via Trach. TAchypenic and tachycardic. Afebrile but wbc up to 27.6K. On TF. Onn heparin gtt  12/25 - Back on CRRT. On vent via trach at 40% fio2, On scheduled dilaudid as add on to oxy. Pper family request 12/24 - they felt scheduled oxy was not adequate and patien was showing  signs of opioid withdrawal.  Patient also had  worsening  SIRS/sepsis syndrome. Had fever, rising wbc, worsening RR 40-60 and HR 140s sinsu-> strated On abx yesterday. Fever some better today. WBC plateau at 28,.5K. On new levophed -> signifanct improvement in HR 77 andRR t0 20. On heparin gtt.  On precedex gtt. On levophed gtt 1mcg wthi midodrine. On TF   Consults:  Cardiology [facility] Nephrology   Procedures:  R PICC 11/5 >> A line 11/9 >> out ETT 11/9 > 11/16, 11/16 >> 09/21/2020, 09/21/2020 tracheostomy>> Lt [place] CVL 11/9 >> R IJ trialysis >> out HD catheter 12/1 >>12/20 12/21 - 14.5 Fr, 23 cm right IJ tunneled hemodialysis catheter placement.  Removal of indwelling subclavian catheter.   Significant Diagnostic Tests:  11/3 CT abd/ pelvis > 1. Positive for bowel perforation: Pneumoperitoneum and intermediate density free fluid in the abdomen. Prior total colectomy. The specific site of perforation is unclear-oral contrast present to the proximal jejunum has not obviously leaked. Note that there may be small bowel loops adherent to the ventral abdominal wall along the greater curve of the stomach. 2. Extensive bilateral lower lung pneumonia. No pleural effusion. 3. Other abdominal and pelvic viscera are stable since 2015, including bilateral adrenal adenomas. Chronic renal parapelvic cysts. 4. Aortic Atherosclerosis 11/3 TTE > EF 70-75%, RV not well visualized, mildly reduced RV systolic function 07/62 CT chest/ abd/ pelvis> 1. Interval progression of diffuse bilateral hazy ground-glass airspace opacities with more focal areas of consolidation at the lung bases 2. Trace bilateral pleural effusions. 3. Postsurgical changes the abdomen as detailed above. No evidence for a postoperative abscess, however evaluation is limited by lack of IV contrast. 4. There is a 1.9 cm cystic appearing lesion located in the pancreatic body. This was not present on the patient's CT from 2015.  Follow-up with an outpatient contrast enhanced MRI is recommended. 5. The  endometrial stripe appears diffusely thickened. Follow-up with pelvic ultrasound is recommended. Aortic Atherosclerosis 11/14 LE doppler studies > + DVT of right posterior tibial and peroneal vein, +dVT of left posterior tibial vein  11/26 CT ABD PEL > liver unremarkable, distended gallbladder with layering tiny gallstones versus sludge, no duct dilatation, mild hyperdensity right upper pole renal collecting system new.  No evidence of retroperitoneal bleeding.  Multifocal lower lobe predominant pulmonary infiltrates/pneumonia.  Small left pleural effusion.  Micro Data:  11/10 MRSA PCR > neg 11/10 BC x 2 > neg 09/22/2020 blood cultures x2>> S epi.  09/22/2020 sputum culture>> MRSA Blood 12/1 >> negative. S.epi 1 out of 2, likely contaminant BCx 2 12/12 >> neg xxx Trach aspirate 12/24 - FEW STAPHYLOCOCCUS AUREUS  Blood 12/24  Antimicrobials:  azithro 11/2 >11/3 Ceftriaxone 11/2  Fluconazole 11/3 Zosyn 11/3 >> 11/7 Vanc 11/10 off Cefepime 11/10 > 11/16 09/22/2020 vancomycin for MRSA PNA >> 12/2 xxxx Vanc 12/11> 12/17 Zosyn 12/11> 12/17 xxxx vanc 12/24 (staph resp cutlure) Zosyun 12/24 - 12/27  Subjective:   Back on continuous dialysis, I=O planned Resume Precedex 0.6 Vancomycin restarted 12/24 Norepinephrine 3   Objective   Blood pressure 109/68, pulse (!) 106, temperature (!) 96.3 F (35.7 C), temperature source Axillary, resp. rate (!) 30, height 5\' 8"  (1.727 m), weight 74.9 kg, SpO2 97 %.    Vent Mode: PRVC FiO2 (%):  [40 %] 40 % Set Rate:  [8 bmp-28 bmp] 8 bmp Vt Set:  [450 mL] 450 mL PEEP:  [5 cmH20] 5 cmH20 Plateau Pressure:  [18 cmH20-30 cmH20] 24 cmH20   Intake/Output  Summary (Last 24 hours) at 10/25/2020 0854 Last data filed at 10/25/2020 0815 Gross per 24 hour  Intake 2975.36 ml  Output 3144 ml  Net -168.64 ml   Filed Weights   10/23/20 0500 10/24/20 0500 10/25/20 0417  Weight: 75.3 kg 75.3 kg 74.9 kg     General Appearance:  Critically ill obese  woman, NAD Head:  Normocephalic  Eyes:  No icterus Nose:  NGT in place Throat:  Trach cdi Lungs: coarse B, referred noise from trach, secretions Heart: regular, distant, tachy Abdomen:  Obede, ostomy intact, brown stool Genitalia / Rectal:  Not done Extremities:  No edema Skin:  Sacral wound w necrotic fat, no discharge Neurologic:  Awake, tracks, did not follow commands.       Resolved problems:  Previously hemorrhagic shock requiring PRBCs, Septic shock, MRSA pneumonia s/p 8 days abx Recurrent septic shock 12/11 -working dx recurrent HCAP c/b adrenal insuff - Completed 7 days of vanc and zosyn 12/17. Culture negative. 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  Has not been able to make progress with vent weaning, remains  PLAn -Continue mechanical ventilation, PRVC.  PSV if hemodynamics and overall stability will allow -Intermittent chest x-ray -VAP prevention order set  Sepsis.  Has had multiple bouts now with recurrence 10/22/20, presumed source staff VAP. Plan R-estarted vancomycin 12/24, narrowed 12/26.  Plan to continue for now as we await sensitivities  Multifactorial circulatory shock, including effects of sepsis, volume shifts, medications  Currently norepinephrine 3  Plan -Wean norepinephrine as able for MAP 65 -Continue midodrine, currently at 30 mg 3 times daily -Try to minimize sedating medications if able -Keeping I=O with CVVH  Acute metabolic encephalopathy in the setting of renal failure, shock, sepsis-sedated medications  Plan -Scheduled oxycodone transition to scheduled IV Dilaudid 12/24 -Scheduled clonazepam 1 mg twice daily -Seroquel 100 mg twice daily -Precedex infusion, to be to wean -Midazolam available as needed -PT efforts if able   Acute renal failure.   Oliguric / Anuric ATN - CRRT started on 11/10; held 12/8; resumed on 12/10.  Have attempted to come off CVVH to intermittent HD but hemodynamics have not allowed  Plan: -Appreciate nephrology management.  We will continue efforts at transitioning to intermittent HD if we can achieve hemodynamic improvement   Duodenal ulcer perforation status post ex lap and Graham's patch early in this hospitalization Decubitus ulcer sacrum Plan -Appreciate [facility]CS management -Standard colostomy care -Hydrotherapy, appreciate WOC  Paroxysmal A. fib, intermittent Heart rate currently 120 Plan -Continue heparin infusion -Not on any other rate controlling medications   Indeterminate bilateral DVT -Covered with IV heparin  Anemia of critical illness -Follow CBC -Hemoglobin goal 7.0  Diabetes type 2 w/ Hyperglycemia -Lantus 40 units -SSI as per protocol -Steroids discontinued  History of stage IV colon cancer 1.9 cm cystic appearing lesion located in the pancreatic body  Plan -Oncology follow-up and evaluation if she can survive this critical illness  Best practice:  Diet: TF Pain/Anxiety/Delirium protocol (if indicated) dilaudid gtt/ enteral sedation-  clonazepam, oxy, seroquel VAP protocol (if indicated): yes DVT prophylaxis: IV heparin GI prophylaxis: Protonix Glucose control: SSI, TF coverage, lantus Mobility: Bedrest  Goals of Care:  Last date of multidisciplinary goals of care discussion:12/14 and then again 10/22/20  Family and staff present:  nurse practitioner, bedside nurse, and the patient's daughter  Summary  of discussion:  There have been no changes in goals of care, family continues to desire any and all therapies, remains full code, family remains hopeful that she will survive and be discharged to home Follow up goals of care discussion due: no change in goals of care.    Code Status: Full    Multi-Disciplinary Goals of Care Discussion Date of Discussion 10/22/2020  and is Day 73 since admit  Primary service for patient ccm  Location of discussion Bedside [id number]  Family and Staff present Autumn RN, Chase Caller MD, Candace [name] DPOA over video and one oother daughter at bedside  Summary of discussion MOst of discussion around SIRS (high HR and RR)  Followup goals of care due by 10/29/20  Misc comments if any continue full active care and full code    Family updates -> Daughter [name] [phone]   - - updated 10/23/20. Daughter acknowledged that patient better today. She expressed concern that patient was very unstable yesterday and this has contributed to setback and pressor need. Says RN could not get temperature yesterday and was asking what protocols in place for not being able to record temp. Advised to talk to Muddy   The patient Candace [name] is critically ill with multiple organ systems failure and requires high complexity decision making for assessment and support, frequent evaluation and titration of therapies, application of advanced monitoring technologies and extensive interpretation of multiple databases.   Critical Care Time devoted to patient care services described in this note is 33 Minutes. This time reflects time of care of this signee. This critical care time does not reflect procedure time, or teaching time or supervisory time of PA/NP/Med student/Med Resident etc but could involve care discussion time   Baltazar Apo, MD, PhD 10/25/2020, 9:09 AM [facility] [phone] or if no answer [phone]    LABS    PULMONARY Recent Labs  Lab 10/22/20 1441  PHART 7.416  PCO2ART 38.3  PO2ART 67*  HCO3 24.4  TCO2 26  O2SAT 93.0    CBC Recent Labs  Lab 10/23/20 0454 10/24/20 0459 10/25/20 0506  HGB 7.3* 7.0* 7.3*  HCT 24.3* 23.9* 24.7*  WBC 28.5* 18.7* 16.4*  PLT 314 286 285    COAGULATION No results for input(s): INR in the last 168 hours.  CARDIAC  No  results for input(s): TROPONINI in the last 168 hours. No results for input(s): PROBNP in the last 168 hours.   CHEMISTRY Recent Labs  Lab 10/21/20 0308 10/21/20 1609 10/22/20 0329 10/22/20 1441 10/23/20 0454 10/23/20 1442 10/24/20 0459 10/24/20 1725 10/25/20 0506  NA 132*   < > 131*   < > 132* 131* 133* 132* 133*  K 4.9   < > 4.5   < > 5.2* 5.2* 5.0 4.9 4.7  CL 98   < > 96*   < > 99 98 100 98 101  CO2 25   < > 24   < > 22 22 23 24 22   GLUCOSE 241*   < > 194*   < > 161* 242* 128* 159* 114*  BUN 39*   < > 38*   < > 72* 67* 53* 45* 35*  CREATININE 0.48   < > 0.47   < > 0.79 0.71 0.65 0.58 0.54  CALCIUM 10.6*   < > 11.5*   < > 11.1* 10.9* 10.5* 9.8 10.0  MG 2.5*  --  2.6*  --  2.6*  --  2.6*  --  2.5*  PHOS 2.9   < > 3.8  --  4.9* 4.6 4.3 3.6 3.3   < > = values in this interval not displayed.   Estimated Creatinine Clearance: 67 mL/min (by C-G formula based on SCr of 0.54 mg/dL).   LIVER Recent Labs  Lab 10/19/20 1836 10/20/20 0350 10/23/20 0454 10/23/20 1442 10/24/20 0459 10/24/20 1725 10/25/20 0506  AST 74*  --   --   --   --   --   --   ALT 85*  --   --   --   --   --   --   ALKPHOS 332*  --   --   --   --   --   --   BILITOT 0.6  --   --   --   --   --   --   PROT 7.4  --   --   --   --   --   --   ALBUMIN 2.4*   < > 2.4* 2.3* 2.2* 2.1* 2.0*   < > = values in this interval not displayed.     INFECTIOUS Recent Labs  Lab 10/19/20 1836 10/22/20 1248  LATICACIDVEN 1.6 1.4  PROCALCITON  --  4.96     ENDOCRINE CBG (last 3)  Recent Labs    10/24/20 2307 10/25/20 0358 10/25/20 0758  GLUCAP 92 154* 102*

## 2020-10-25 NOTE — Progress Notes (Signed)
ANTICOAGULATION & ANTIBIOTIC CONSULT NOTE  Pharmacy Consult:  Heparin + Vancomycin Indication: atrial fibrillation + PNA  Labs: Recent Labs    10/22/20 1248 10/22/20 1441 10/23/20 0454 10/23/20 1442 10/24/20 0459 10/24/20 1441 10/24/20 1725 10/25/20 0506  HGB  --    < > 7.3*  --  7.0*  --   --  7.3*  HCT  --    < > 24.3*  --  23.9*  --   --  24.7*  PLT  --    < > 314  --  286  --   --  285  HEPARINUNFRC  --   --  0.24*   < > 0.34 0.47  --  0.50  CREATININE  --    < > 0.79   < > 0.65  --  0.58 0.54  CKTOTAL 26*  --   --   --   --   --   --   --   CKMB 15.4*  --   --   --   --   --   --   --    < > = values in this interval not displayed.    Assessment: 69 yo female with history of Afib on apixaban prior to admission which has been held and patient transitioned to IV heparin.  Heparin level is therapeutic; no further bleeding per RN.  Hemoglobin remains low normal, platelet count WNL.  Pharmacy also dosing vancomycin for PNA.  Remains on CRRT, hypothermic, WBC down to 16.4.  LVQ 10/31 x1, Azith 11/2>>11/3, CTX 11/2 x1, Flucon 11/3 x1 Zosyn 11/3>11/7; 12/11>12/17; 12/24 >> 12/26  Vanc 11/10 x1; 11/24>11/30; 12/11>12/17; 12/24 >> Cefepime 11/10 >>11/16; 11/24>11/26 Tressie Ellis x1 11/26  11/27 VT = 17 on 1g q24 and CRRT >> no change  11/10 MRSA PCR: neg 11/10 Bcx: neg 11/18 BAL: rare GPC, few candida alb - not treating per CCM 11/24 TA: MRSA 11/24 Bcx: 1/4 MRSE 12/1 Bcx: 1/4 MRSE 12/12 Bcx - negative 12/24 bcx: ngtd  12/24 TA - Staph aureus (preliminary) 12/25 BCx - NGTD  Goal of Therapy:  Heparin level 0.3-0.5 units/ml  Vanc trough 15-20 mcg/mL   Plan:  Decrease heparin gtt slightly to 1300 units/hr Daily heparin level and CBC Monitor for bleeding  Continue vanc 714m IV Q24H while on CRRT Monitor CRRT tolerance/interruptions, micro data, PRN vanc trough F/u plan [facility] Clemence D. DMina Marble PharmD, BCPS, BWest [place]12/27/2021, 9:05 AM

## 2020-10-25 NOTE — Consult Note (Signed)
[place] Nurse Consult Note: Patient receiving care in [facility] 3M07.  Patient seen with PT staff present for hydrotherapy. PT Rolinda Roan and I conferred on approaches to remove dried adipose tissue to sacral wound bed. I have discontinued the santyl for now and implemented 3 days of bid Dakin's solution 1/4 percent.  The wound may need more than 3 days of Dakin's solution to remove the adipose tissue. Val Riles, RN, MSN, CWOCN, CNS-BC, pager [phone]

## 2020-10-26 DIAGNOSIS — J9620 Acute and chronic respiratory failure, unspecified whether with hypoxia or hypercapnia: Secondary | ICD-10-CM | POA: Diagnosis not present

## 2020-10-26 LAB — GLUCOSE, CAPILLARY
Glucose-Capillary: 110 mg/dL — ABNORMAL HIGH (ref 70–99)
Glucose-Capillary: 161 mg/dL — ABNORMAL HIGH (ref 70–99)
Glucose-Capillary: 169 mg/dL — ABNORMAL HIGH (ref 70–99)
Glucose-Capillary: 216 mg/dL — ABNORMAL HIGH (ref 70–99)
Glucose-Capillary: 248 mg/dL — ABNORMAL HIGH (ref 70–99)
Glucose-Capillary: 95 mg/dL (ref 70–99)

## 2020-10-26 LAB — RENAL FUNCTION PANEL
Albumin: 2.1 g/dL — ABNORMAL LOW (ref 3.5–5.0)
Albumin: 2.1 g/dL — ABNORMAL LOW (ref 3.5–5.0)
Anion gap: 10 (ref 5–15)
Anion gap: 9 (ref 5–15)
BUN: 39 mg/dL — ABNORMAL HIGH (ref 8–23)
BUN: 37 mg/dL — ABNORMAL HIGH (ref 8–23)
CO2: 23 mmol/L (ref 22–32)
CO2: 25 mmol/L (ref 22–32)
Calcium: 10.1 mg/dL (ref 8.9–10.3)
Calcium: 10.2 mg/dL (ref 8.9–10.3)
Chloride: 99 mmol/L (ref 98–111)
Chloride: 100 mmol/L (ref 98–111)
Creatinine, Ser: 0.65 mg/dL (ref 0.44–1.00)
Creatinine, Ser: 0.57 mg/dL (ref 0.44–1.00)
GFR, Estimated: >60 mL/min
GFR, Estimated: >60 mL/min (ref >60)
Glucose, Bld: 156 mg/dL — ABNORMAL HIGH (ref 70–99)
Glucose, Bld: 114 mg/dL — ABNORMAL HIGH (ref 70–99)
Phosphorus: 3.5 mg/dL (ref 2.5–4.6)
Phosphorus: 2.8 mg/dL (ref 2.5–4.6)
Potassium: 5 mmol/L (ref 3.5–5.1)
Potassium: 4.2 mmol/L (ref 3.5–5.1)
Sodium: 132 mmol/L — ABNORMAL LOW (ref 135–145)
Sodium: 134 mmol/L — ABNORMAL LOW (ref 135–145)

## 2020-10-26 LAB — CBC
HCT: 25.6 % — ABNORMAL LOW (ref 36.0–46.0)
Hemoglobin: 7.4 g/dL — ABNORMAL LOW (ref 12.0–15.0)
MCH: 27.9 pg (ref 26.0–34.0)
MCHC: 28.9 g/dL — ABNORMAL LOW (ref 30.0–36.0)
MCV: 96.6 fL (ref 80.0–100.0)
Platelets: 293 10*3/uL (ref 150–400)
RBC: 2.65 MIL/uL — ABNORMAL LOW (ref 3.87–5.11)
RDW: 16.2 % — ABNORMAL HIGH (ref 11.5–15.5)
WBC: 15.4 10*3/uL — ABNORMAL HIGH (ref 4.0–10.5)
nRBC: 2.4 % — ABNORMAL HIGH (ref 0.0–0.2)

## 2020-10-26 LAB — HEPARIN LEVEL (UNFRACTIONATED): Heparin Unfractionated: 0.49 IU/mL (ref 0.30–0.70)

## 2020-10-26 LAB — MAGNESIUM: Magnesium: 2.7 mg/dL — ABNORMAL HIGH (ref 1.7–2.4)

## 2020-10-26 MED ORDER — GUAIFENESIN 100 MG/5ML PO SOLN
15.0000 mL | Freq: Two times a day (BID) | ORAL | Status: DC
  Administered 2020-10-26 – 2020-11-18 (×47): 300 mg
  Filled 2020-10-26: qty 15
  Filled 2020-10-26: qty 45
  Filled 2020-10-26 (×2): qty 15
  Filled 2020-10-26: qty 75
  Filled 2020-10-26 (×3): qty 15
  Filled 2020-10-26: qty 45
  Filled 2020-10-26 (×10): qty 15
  Filled 2020-10-26: qty 75
  Filled 2020-10-26: qty 45
  Filled 2020-10-26 (×13): qty 15
  Filled 2020-10-26: qty 45
  Filled 2020-10-26 (×9): qty 15
  Filled 2020-10-26: qty 45
  Filled 2020-10-26 (×2): qty 15

## 2020-10-26 MED ORDER — PRISMASOL BGK 0/2.5 32-2.5 MEQ/L EC SOLN
Status: DC
  Filled 2020-10-26 (×10): qty 5000

## 2020-10-26 NOTE — Progress Notes (Signed)
NAMECailie [name], MRN:  [id number], DOB:  Aug 16, 1951, LOS: 34 ADMISSION DATE:  08/31/2020, CONSULTATION DATE:  09/07/2020 REFERRING MD:  Dr Candiss Norse, CHIEF COMPLAINT:  Acute resp failure  Brief History   69 year old female who was previously diagnosed with Covid 08/23/2020.  Admitted 11/2 with AF-RVR, found to have a perforated duodenal underwent exploratory laparotomy with Phillip Heal patch placement.  Past Medical History  Covid pneumonia Atrial fibrillation CKD stage III Diabetes mellitus Hypertension Colon cancer Hyperlipidemia  Significant Hospital Events   11/2 Admitted  11/3 OR with findings of perforated duodenal ulcer 11/10 progressive hemorrhagic shock, intubated, transfused, pressors, proned; started on CRRT in PM 11/03 Exploratory laparotomy, Phillip Heal patch, lysis of adhesion for duodenal ulceration postop day 6 11/16 Extubated. Re-intubated overnight due to respiratory distress and hypoxia with decreased mentation 11/18 Bronch, cultures sent 11/19 Hgb down getting blood 11/24 Spiked fever resume empirical antimicrobial therapy 11/26 Hemorrhagic shock, hgb 5.6, increased pressors, CT A/P  11/30 Per palliative "[name] expresses understanding that patient is unfortunately very fragile despite ongoing intensive medical treatment and full mechanical support. She indicates that the family wants to continue with all current interventions despite potential outcomes". 12/08 CRRT discontinued due to clotting 12/09 Family requested transfer to tertiary care [facility]). [facility] denied transfer  12/10 CRRT restarted. Episodes of tachycardia, tachypnea that seem to improve with pain management 12/11 Back in shock. Pressor requirements up. CXR worse. ABX resumed 12/12 Still requiring inc pressors. Had hypoglycemic event. Basal insulin dosing adjusted 12/13 Pressor requirements better. Now hyperglycemic. Re-adjusted Glycemic control  12/14 Changed dilaudid to1/2 dosing from day further. D/c  vasopressin.  Goals of care reconfirmed with daughter.  Patient continues to desire aggressive care.  Not open to discussing any other option, patient family continues to be hopeful that she will be discharged to home with full recovery in spite of multiple attempts by staff to prepare them that this is unlikely scenario 12/15 Dilaudid discontinued sending cortisol for ongoing pressor dependence 12/16 Serum cortisol <20, added stress dose steroids.  PRN Dilaudid, attempting not go back on Dilaudid infusion 12/17 Developed worsening tachycardia during the evening hours received initially IV albumin, followed by resuming IV Dilaudid with question of suboptimal pain control.  Currently looks better back on Dilaudid drip.  We have been able to wean pressors after adding stress dose steroids; near arrest - bradycardia, better with atropine  12/19 Afebrile . Remains on dilaudid and heparin gtt, dilaudid gtt increased overnight for concern of pain / ongoing tachycardia, no other events . NE and precedex off 12/17. Ongoing CRRT- even UF, dosing lokelmia this morning 12/20 On CRRT.  Renal plans for HD cath and moving to HD. Getting wound care  12/23 -No vent weaning per RT, remains on full support, #8 trach in place but previously had #6, no documentation of change noted in chart Afebrile / WBC 20.4  Vent - 30% FiO2, PEEP 5 Glucose range 176-212 I/O 465 ml stool, 3.6L removed with HD, -1.1L in last 24 hours  RN reports ongoing periods of tachypnea / vent dyssynchrony that responds to dilaudid    12/24 - renal stopping CRRT today and plans HD 10/24/20 . 40% fio2 on vent via Trach. TAchypenic and tachycardic. Afebrile but wbc up to 27.6K. On TF. Onn heparin gtt  12/25 - Back on CRRT. On vent via trach at 40% fio2, On scheduled dilaudid as add on to oxy. Pper family request 12/24 - they felt scheduled oxy was not adequate and patien was showing  signs of opioid withdrawal.  Patient also had  worsening  SIRS/sepsis syndrome. Had fever, rising wbc, worsening RR 40-60 and HR 140s sinsu-> strated On abx yesterday. Fever some better today. WBC plateau at 28,.5K. On new levophed -> signifanct improvement in HR 77 andRR t0 20. On heparin gtt.  On precedex gtt. On levophed gtt 4mcg wthi midodrine. On TF   Consults:  Cardiology [facility] Nephrology   Procedures:  R PICC 11/5 >> A line 11/9 >> out ETT 11/9 > 11/16, 11/16 >> 09/21/2020, 09/21/2020 tracheostomy>> Lt [place] CVL 11/9 >> R IJ trialysis >> out HD catheter 12/1 >>12/20 12/21 - 14.5 Fr, 23 cm right IJ tunneled hemodialysis catheter placement.  Removal of indwelling subclavian catheter.   Significant Diagnostic Tests:  11/3 CT abd/ pelvis > 1. Positive for bowel perforation: Pneumoperitoneum and intermediate density free fluid in the abdomen. Prior total colectomy. The specific site of perforation is unclear-oral contrast present to the proximal jejunum has not obviously leaked. Note that there may be small bowel loops adherent to the ventral abdominal wall along the greater curve of the stomach. 2. Extensive bilateral lower lung pneumonia. No pleural effusion. 3. Other abdominal and pelvic viscera are stable since 2015, including bilateral adrenal adenomas. Chronic renal parapelvic cysts. 4. Aortic Atherosclerosis 11/3 TTE > EF 70-75%, RV not well visualized, mildly reduced RV systolic function 66/06 CT chest/ abd/ pelvis> 1. Interval progression of diffuse bilateral hazy ground-glass airspace opacities with more focal areas of consolidation at the lung bases 2. Trace bilateral pleural effusions. 3. Postsurgical changes the abdomen as detailed above. No evidence for a postoperative abscess, however evaluation is limited by lack of IV contrast. 4. There is a 1.9 cm cystic appearing lesion located in the pancreatic body. This was not present on the patient's CT from 2015.  Follow-up with an outpatient contrast enhanced MRI is recommended. 5. The  endometrial stripe appears diffusely thickened. Follow-up with pelvic ultrasound is recommended. Aortic Atherosclerosis 11/14 LE doppler studies > + DVT of right posterior tibial and peroneal vein, +dVT of left posterior tibial vein  11/26 CT ABD PEL > liver unremarkable, distended gallbladder with layering tiny gallstones versus sludge, no duct dilatation, mild hyperdensity right upper pole renal collecting system new.  No evidence of retroperitoneal bleeding.  Multifocal lower lobe predominant pulmonary infiltrates/pneumonia.  Small left pleural effusion.  Micro Data:  11/10 MRSA PCR > neg 11/10 BC x 2 > neg 09/22/2020 blood cultures x2>> S epi.  09/22/2020 sputum culture>> MRSA Blood 12/1 >> negative. S.epi 1 out of 2, likely contaminant BCx 2 12/12 >> neg xxx Trach aspirate 12/24 - FEW STAPHYLOCOCCUS AUREUS  Blood 12/24  Antimicrobials:  azithro 11/2 >11/3 Ceftriaxone 11/2  Fluconazole 11/3 Zosyn 11/3 >> 11/7 Vanc 11/10 off Cefepime 11/10 > 11/16 09/22/2020 vancomycin for MRSA PNA >> 12/2 xxxx Vanc 12/11> 12/17 Zosyn 12/11> 12/17 xxxx vanc 12/24 (staph resp cutlure) >> 12/28 Zosyn 12/24 - 12/27 Cefazolin 12/27 >>   Subjective:   NE weaned to off Remains on CVVHD precedex 0.7 0.40 + 5 I/O + 480cc vanco changed to cefazolin 12/27 based on cx data resp cx   Objective   Blood pressure 110/75, pulse (!) 123, temperature 99.3 F (37.4 C), temperature source Axillary, resp. rate (!) 30, height 5\' 8"  (1.727 m), weight 74 kg, SpO2 97 %.    Vent Mode: PRVC FiO2 (%):  [40 %] 40 % Set Rate:  [28 bmp] 28 bmp Vt Set:  [450 mL] 450 mL PEEP:  [  [place]gram Pressure:  [22 cmH20-26 cmH20] 26 cmH20   Intake/Output Summary (Last 24 hours) at 10/26/2020 3154 Last data filed at 10/26/2020 0700 Gross per 24 hour  Intake 2931.85 ml  Output 2927 ml  Net 4.85 ml   Filed Weights   10/24/20 0500 10/25/20 0417 10/26/20 0500  Weight: 75.3 kg 74.9 kg 74 kg      General Appearance: Critically ill obese woman, no distress, ventilated Head: Normocephalic Eyes: No icterus Nose: G-tube in place Throat: Trach clean, dry, no secretions currently Lungs: Small breaths, somewhat coarse, no wheezing or crackles Heart: Distant, regular, tacky, 110 Abdomen: Obese, nondistended, ostomy, brown stool Genitalia / Rectal: Not done Extremities: No edema Skin:  Sacral wound w necrotic fat, no discharge on 12/27 Neurologic:  Eyes open but does not track, does not follow commands (just got her dilaudid)      Resolved problems:  Previously hemorrhagic shock requiring PRBCs, Septic shock, MRSA pneumonia s/p 8 days abx Recurrent septic shock 12/11 -working dx recurrent HCAP c/b adrenal insuff - Completed 7 days of vanc and zosyn 12/17. Culture negative. 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  Has not been able to make progress with vent weaning, remains on PRVC  PLAn -Continue current mechanical ventilation, PRVC.  Attempt pressure support if hemodynamics and overall stability will allow.  Plan to attempt daily -Vancomycin transitioned to cefazolin 12/27 for MSSA H CAP versus bronchitis.  Plan duration 7 days -Follow intermittent chest x-ray -VAP prevention order set -Initiated bed percussion chest physiotherapy on 12/27 for secretions -Start scheduled guaifenesin 12/28  Sepsis.  Has had multiple bouts now with recurrence 10/22/20, presumed source MSSA VAP. Plan -Cefazolin as above -Follow culture data to completion  Multifactorial circulatory shock, including effects of sepsis, volume shifts, medications Norepinephrine weaned to off overnight 12/27  Plan -Goal MAP 65, norepinephrine currently weaned off -Continue midodrine,  currently 30 mg 3 times daily -Try to minimize sedating medications, medications that will affect blood pressure -Plan to keep volume status even with CVVH  Acute metabolic encephalopathy in the setting of renal failure, shock, sepsis and sedating medications  Plan -Plan to continue scheduled IV Dilaudid, started on 12/24 -Scheduled clonazepam, continue 1 mg twice daily -Continue Seroquel 100 mg twice daily -Attempt to wean Precedex to off 12/28 -Midazolam available as needed -Continue PT efforts   Acute renal failure.  Oliguric / Anuric ATN -has been off and on CRRT Have attempted to come off CVVH to intermittent HD but hemodynamics have not allowed  Plan: -Appreciate nephrology management.  If she can stay off norepinephrine, stabilize hemodynamically then we could retry a transition to intermittent HD.   Duodenal ulcer perforation status post ex lap and Graham's patch early in this hospitalization Decubitus ulcer sacrum Plan -Appreciate [facility] management -Standard colostomy care -Plan appreciate WOC recommendations regarding sacral wound >> santyl held on 12/27, Dakin's solution 0.25 started for 3 days to remove dried adipose tissue from sacral wound bed  Paroxysmal A. fib, intermittent Heart rate currently 110 Plan -Continue heparin -Not on any other rate controlling medications  Indeterminate bilateral DVT -Covered with IV heparin  Anemia of critical illness -Follow CBC -Hemoglobin goal 7.0  Diabetes type 2 w/ Hyperglycemia -Continue Lantus 40 units -Sliding scale insulin as ordered -Tube feed coverage 7 units every  4 hours -Now off corticosteroids  History of stage IV colon cancer 1.9 cm cystic appearing lesion located in the pancreatic body  Plan -Oncology follow-up and evaluation if she can survive this critical illness  Best practice:  Diet: TF Pain/Anxiety/Delirium protocol (if indicated) dilaudid IV pushes/ enteral sedation-  clonazepam, oxy,  seroquel VAP protocol (if indicated): yes DVT prophylaxis: IV heparin GI prophylaxis: Protonix Glucose control: SSI, TF coverage, lantus Mobility: Bedrest  Goals of Care:  Last date of multidisciplinary goals of care discussion:12/14 and then again 10/22/20  Family and staff present:  nurse practitioner, bedside nurse, and the patient's daughter  Summary of discussion:  There have been no changes in goals of care, family continues to desire any and all therapies, remains full code, family remains hopeful that she will survive and be discharged to home Follow up goals of care discussion due: no change in goals of care.    Code Status: Full    Multi-Disciplinary Goals of Care Discussion Date of Discussion 10/22/2020 and is Day 62 since admit  Primary service for patient ccm  Location of discussion Bedside [id number]  Family and Staff present Autumn RN, Chase Caller MD, [name] DPOA over video and one oother daughter at bedside  Summary of discussion MOst of discussion around SIRS (high HR and RR)  Followup goals of care due by 10/29/20  Misc comments if any continue full active care and full code    Family updates -> Daughter [name] [phone]  Spoke with the patient's daughter [name] by phone on 12/28. Updated her in full.      ATTESTATION & SIGNATURE   The patient Candace [name] is critically ill with multiple organ systems failure and requires high complexity decision making for assessment and support, frequent evaluation and titration of therapies, application of advanced monitoring technologies and extensive interpretation of multiple databases.   Critical Care Time devoted to patient care services described in this note is 31 Minutes. This time reflects time of care of this signee. This critical care time does not reflect procedure time, or teaching time or supervisory time of PA/NP/Med student/Med Resident etc but could involve care discussion time   Baltazar Apo, MD,  PhD 10/26/2020, 8:03 AM [facility] [phone] or if no answer [phone]    LABS    PULMONARY Recent Labs  Lab 10/22/20 1441  PHART 7.416  PCO2ART 38.3  PO2ART 67*  HCO3 24.4  TCO2 26  O2SAT 93.0    CBC Recent Labs  Lab 10/24/20 0459 10/25/20 0506 10/26/20 0459  HGB 7.0* 7.3* 7.4*  HCT 23.9* 24.7* 25.6*  WBC 18.7* 16.4* 15.4*  PLT 286 285 293    COAGULATION No results for input(s): INR in the last 168 hours.  CARDIAC  No results for input(s): TROPONINI in the last 168 hours. No results for input(s): PROBNP in the last 168 hours.   CHEMISTRY Recent Labs  Lab 10/22/20 0329 10/22/20 1441 10/23/20 0454 10/23/20 1442 10/24/20 0459 10/24/20 1725 10/25/20 0506 10/25/20 1556 10/26/20 0459  NA 131*   < > 132*   < > 133* 132* 133* 134* 132*  K 4.5   < > 5.2*   < > 5.0 4.9 4.7 4.8 5.0  CL 96*   < > 99   < > 100 98 101 101 99  CO2 24   < > 22   < > 23 24 22 23 23   GLUCOSE 194*   < > 161*   < >  128* 159* 114* 221* 156*  BUN 38*   < > 72*   < > 53* 45* 35* 43* 39*  CREATININE 0.47   < > 0.79   < > 0.65 0.58 0.54 0.51 0.65  CALCIUM 11.5*   < > 11.1*   < > 10.5* 9.8 10.0 9.5 10.1  MG 2.6*  --  2.6*  --  2.6*  --  2.5*  --  2.7*  PHOS 3.8  --  4.9*   < > 4.3 3.6 3.3 3.0 3.5   < > = values in this interval not displayed.   Estimated Creatinine Clearance: 67 mL/min (by C-G formula based on SCr of 0.65 mg/dL).   LIVER Recent Labs  Lab 10/19/20 1836 10/20/20 0350 10/24/20 0459 10/24/20 1725 10/25/20 0506 10/25/20 1556 10/26/20 0459  AST 74*  --   --   --   --   --   --   ALT 85*  --   --   --   --   --   --   ALKPHOS 332*  --   --   --   --   --   --   BILITOT 0.6  --   --   --   --   --   --   PROT 7.4  --   --   --   --   --   --   ALBUMIN 2.4*   < > 2.2* 2.1* 2.0* 1.9* 2.1*   < > = values in this interval not displayed.     INFECTIOUS Recent Labs  Lab 10/19/20 1836 10/22/20 1248  LATICACIDVEN 1.6 1.4   PROCALCITON  --  4.96     ENDOCRINE CBG (last 3)  Recent Labs    10/25/20 2338 10/26/20 0344 10/26/20 0709  GLUCAP 188* 161* 169*

## 2020-10-26 NOTE — Progress Notes (Signed)
Physical Therapy Wound Treatment Patient Details  Name: Candace [name] MRN: [id number] Date of Birth: 07-18-1951  Today's Date: 10/26/2020 Time: 2297-9892 Time Calculation (min): 42 min  Subjective  Subjective: Lethargic on vent with intermittent eyes open. Patient and Family Stated Goals: Heal wound; maintain pt comfort per daughter Date of Onset:  (Unknown)  Pain Score:  Pt premedicated and appeared to tolerate treatment well with minimal to no discomfort.   Wound Assessment  Pressure Injury 09/25/20 Sacrum Bilateral;Medial Deep Tissue Pressure Injury - Purple or maroon localized area of discolored intact skin or blood-filled blister due to damage of underlying soft tissue from pressure and/or shear. Purple, non-blanchable, b (Active)  Dressing Type ABD;Barrier Film (skin prep);Gauze (Comment);Moist to dry 10/26/20 1303  Dressing Changed;Clean;Dry;Intact 10/26/20 1303  Dressing Change Frequency Daily 10/26/20 1303  State of Healing Eschar 10/26/20 1303  Site / Wound Assessment Yellow;Red;Black 10/26/20 1303  % Wound base Red or Granulating 20% 10/26/20 1303  % Wound base Yellow/Fibrinous Exudate 60% 10/26/20 1303  % Wound base Black/Eschar 20% 10/26/20 1303  % Wound base Other/Granulation Tissue (Comment) 0% 10/26/20 1303  Peri-wound Assessment Bleeding;Maceration 10/26/20 1303  Wound Length (cm) 16 cm 10/20/20 0941  Wound Width (cm) 11 cm 10/20/20 0941  Wound Depth (cm) 4 cm 10/20/20 0941  Wound Surface Area (cm^2) 176 cm^2 10/20/20 0941  Wound Volume (cm^3) 704 cm^3 10/20/20 0941  Tunneling (cm) 0 10/13/20 1200  Undermining (cm) 0 10/13/20 1200  Margins Unattached edges (unapproximated) 10/26/20 1303  Drainage Amount Minimal 10/26/20 1303  Drainage Description Serosanguineous 10/26/20 1303  Treatment Debridement (Selective);Hydrotherapy (Pulse lavage);Packing (Dakin's soaked gauze) 10/26/20 1303      Hydrotherapy Pulsed lavage therapy - wound location: sacrum Pulsed Lavage  with Suction (psi): 12 psi Pulsed Lavage with Suction - Normal Saline Used: 1000 mL Pulsed Lavage Tip: Tip with splash shield Selective Debridement Selective Debridement - Location: sacrum Selective Debridement - Tools Used: Forceps;Scalpel Selective Debridement - Tissue Removed: eschar; non-viable tissue   Wound Assessment and Plan  Wound Therapy - Assess/Plan/Recommendations Wound Therapy - Clinical Statement: Daughter present today, and with many questions regarding plan of care specifically for the wound. Questions answered within scope of practice. Will continue to follow for selective removal of unviable tissue, to decrease bioburden, and promote wound bed healing. Wound Therapy - Functional Problem List: Global weakness and immobility Factors Delaying/Impairing Wound Healing: Diabetes Mellitus;Immobility;Multiple medical problems Hydrotherapy Plan: Debridement;Dressing change;Patient/family education;Pulsatile lavage with suction Wound Therapy - Frequency: 6X / week Wound Therapy - Follow Up Recommendations: Skilled nursing facility Wound Plan: See above  Wound Therapy Goals- Improve the function of patient's integumentary system by progressing the wound(s) through the phases of wound healing (inflammation - proliferation - remodeling) by: Decrease Necrotic Tissue to: 20% Decrease Necrotic Tissue - Progress: Progressing toward goal Increase Granulation Tissue to: 80% Increase Granulation Tissue - Progress: Progressing toward goal Goals/treatment plan/discharge plan were made with and agreed upon by patient/family: No, Patient unable to participate in goals/treatment/discharge plan and family unavailable Time For Goal Achievement: 7 days Wound Therapy - Potential for Goals: Fair  Goals will be updated until maximal potential achieved or discharge criteria met.  Discharge criteria: when goals achieved, discharge from hospital, MD decision/surgical intervention, no progress towards  goals, refusal/missing three consecutive treatments without notification or medical reason.  GP     Thelma Comp 10/26/2020, 1:24 PM  Candace [name], PT, DPT Acute Rehabilitation Services Pager: [phone] Office: [phone]

## 2020-10-26 NOTE — Progress Notes (Signed)
[facility] NEPHROLOGY PROGRESS NOTE  Assessment/ Plan:  # AKI- oliguric presumably due to ischemic ATN in setting of hemorrhagic shock. Started on CRRT 09/08/20 due to persistent oliguria and hyperkalemia. All fluids 4K/2.5Ca: Pre-filter to 500 ml/hr, post-filter 200 ml/hr, and dialysate to 1000 ml/hr. Keeping even to 50 cc/hr. No anticoagulation due to thrombocytopenia and ongoing GI blood loss.  Potassium level 5 therefore I will change dialysate potassium to 2K, continue prefilter and post filter 4K bath.  Monitor lab.  UF net even to 50 cc/hour as tolerated by BP. Pt has deteriorated several times when trying to transition to IHD.  Tried again on 12/24 which unfortunately failed again- only lasted 12 hours.... this is an issue-  Not sure when we will be able to transition her- cannot keep her on CRRT forever. RIJ TDC placed 10/19/20 by IR.   She is now off of Levophed.  If her blood pressure remains stable off pressors for few days then we may try another IHD.  # Hemorrhagic shock- ongoing blood loss and requiring pressors. Transfuse as needed, have added ESA.  # Septic shock due to MRSA pneumonia.  # Perforated duodenal ulcer- s/p exploratory lap and Graham patch placement per surgery.  # Acute hypoxic/hypercapnic respiratory failure due to ARDS from Recent Covid-19 PNA- currently on vent via trach per PCCM. low o2.  # P. Atrial fibrillation- no anticoagulation due to GI bleed.  # Hyponatremia- worsened quickly off of CRRT-  manage with crrt.   # Severe protein malnutrition- per primary svc.  # Disposition- poor overall prognosis and will likely require LTAC and chronic vent support if we can ever get her transitioned to IHD. PCCM tohave ongoinggoals of care [place] family.  Subjective: Seen and examined in ICU.  Tolerating CRRT well.  Off of Levophed now with acceptable BP reading. No issue with the filter.  No new event. Objective Vital signs in last  24 hours: Vitals:   10/26/20 0830 10/26/20 0845 10/26/20 0900 10/26/20 0930  BP: 106/64 118/68 119/69 119/75  Pulse: (!) 110 (!) 108 (!) 108 (!) 103  Resp: (!) 32 (!) 26 (!) 31 (!) 27  Temp:      TempSrc:      SpO2: 100% 100% 99% 99%  Weight:      Height:       Weight change: -0.9 kg  Intake/Output Summary (Last 24 hours) at 10/26/2020 1004 Last data filed at 10/26/2020 0913 Gross per 24 hour  Intake 2757.58 ml  Output 2667 ml  Net 90.58 ml       Labs: Basic Metabolic Panel: Recent Labs  Lab 10/25/20 0506 10/25/20 1556 10/26/20 0459  NA 133* 134* 132*  K 4.7 4.8 5.0  CL 101 101 99  CO2 _0 GLUCOSE 114* 221* 156*  BUN 35* 43* 39*  CREATININE 0.54 0.51 0.65  CALCIUM 10.0 9.5 10.1  PHOS 3.3 3.0 3.5   Liver Function Tests: Recent Labs  Lab 10/19/20 1836 10/20/20 0350 10/25/20 0506 10/25/20 1556 10/26/20 0459  AST 74*  --   --   --   --   ALT 85*  --   --   --   --   ALKPHOS 332*  --   --   --   --   BILITOT 0.6  --   --   --   --   PROT 7.4  --   --   --   --   ALBUMIN 2.4*   < >  2.0* 1.9* 2.1*   < > = values in this interval not displayed.   No results for input(s): LIPASE, AMYLASE in the last 168 hours. No results for input(s): AMMONIA in the last 168 hours. CBC: Recent Labs  Lab 10/22/20 1736 10/23/20 0454 10/24/20 0459 10/25/20 0506 10/26/20 0459  WBC 27.2* 28.5* 18.7* 16.4* 15.4*  HGB 7.6* 7.3* 7.0* 7.3* 7.4*  HCT 25.3* 24.3* 23.9* 24.7* 25.6*  MCV 95.8 96.0 96.4 95.7 96.6  PLT 329 314 286 285 293   Cardiac Enzymes: Recent Labs  Lab 10/22/20 1248  CKTOTAL 26*  CKMB 15.4*   CBG: Recent Labs  Lab 10/25/20 1553 10/25/20 1921 10/25/20 2338 10/26/20 0344 10/26/20 0709  GLUCAP 203* 162* 188* 161* 169*    Iron Studies: No results for input(s): IRON, TIBC, TRANSFERRIN, FERRITIN in the last 72 hours. Studies/Results: No results found.  Medications: Infusions: .  prismasol BGK 4/2.5 500 mL/hr at 10/26/20 0909  .   prismasol BGK 4/2.5 200 mL/hr at 10/26/20 0428  . sodium chloride 10 mL/hr at 10/26/20 0900  . sodium chloride    .  ceFAZolin (ANCEF) IV Stopped (10/26/20 0537)  . dexmedetomidine (PRECEDEX) IV infusion 0.7 mcg/kg/hr (10/26/20 0900)  . feeding supplement (VITAL 1.5 CAL) 1,000 mL (10/25/20 1947)  . heparin 1,300 Units/hr (10/26/20 0900)  . norepinephrine (LEVOPHED) Adult infusion Stopped (10/26/20 0052)  . prismasol BGK 4/2.5 1,000 mL/hr at 10/26/20 0910    Scheduled Medications: . B-complex with vitamin C  1 tablet Per Tube Daily  . chlorhexidine gluconate (MEDLINE KIT)  15 mL Mouth Rinse BID  . Chlorhexidine Gluconate Cloth  6 each Topical Daily  . cholecalciferol  2,000 Units Per Tube Daily  . clonazepam  1 mg Per Tube BID  . [START ON 10/28/2020] collagenase   Topical Daily  . darbepoetin (ARANESP) injection - DIALYSIS  100 mcg Subcutaneous Q Tue-1800  . docusate  100 mg Per Tube BID  . feeding supplement (PROSource TF)  90 mL Per Tube TID  . guaiFENesin  15 mL Per Tube Q12H  .  HYDROmorphone (DILAUDID) injection  1 mg Intravenous Q4H  . insulin aspart  0-15 Units Subcutaneous Q4H  . insulin aspart  7 Units Subcutaneous Q4H  . insulin glargine  40 Units Subcutaneous Daily  . mouth rinse  15 mL Mouth Rinse QID  . midodrine  30 mg Per Tube TID WC  . nutrition supplement (JUVEN)  1 packet Per Tube BID BM  . pantoprazole sodium  40 mg Per Tube BID  . polyethylene glycol  17 g Per Tube Daily  . QUEtiapine  100 mg Per Tube BID  . sodium chloride flush  10-40 mL Intracatheter Q12H  . sodium hypochlorite   Topical BID    have reviewed scheduled and prn medications.  Physical Exam: General: Critically ill looking female with a tracheostomy, opens eyes with the name Heart:RRR, s1s2 nl Lungs: Coarse breath sound bilateral Abdomen:soft,  non-distended Extremities:No edema Dialysis Access: Right IJ TDC  Kenshin Splawn Prasad Quianna Avery 10/26/2020,10:04 AM  LOS: 55 days  Pager:  [phone]

## 2020-10-26 NOTE — Progress Notes (Signed)
Nutrition Follow-up  DOCUMENTATION CODES:   Obesity unspecified  INTERVENTION:   Tube Feeding via Cortrak Vital 1.5 at 61ml/hr Pro-source TF 90 mL TID Provides 171g of protein, 2580kcals and 1094 mL of free water  Continue Juven BID, each packet provides 80 calories, 8 grams of carbohydrate, 2.5  grams of protein (collagen), 7 grams of L-arginine and 7 grams of L-glutamine; supplement contains CaHMB, Vitamins C, E, B12 and Zinc to promote wound healing  Continue B-complex with C   NUTRITION DIAGNOSIS:   Inadequate oral intake related to inability to eat as evidenced by NPO status.  Being addressed via TF   GOAL:   Patient will meet greater than or equal to 90% of their needs  Progressing  MONITOR:   Vent status,TF tolerance,Labs,Weight trends  REASON FOR ASSESSMENT:   Consult New TPN/TNA  ASSESSMENT:   69 y.o. female was recently hospitalized for COVID-19 pneumonia-and discharged on 2-3 L of home O2-presents with worsening shortness of breath-found to have A. fib with RVR, worsening hypoxia. Pt developed severe upper abdominal pain-subsequent further imaging studies revealed perforated viscus. S/p laparotomy on 11/3 which showed a perforated duodenal ulcer.   Pt remains on vent support Remains on CRRT, noted possible trial of iHD again as pt off levophed again. Noted iHD attempted again on 12/24 but was unsuccessful  Vital 1.5 at 65 ml/hr with Pro-Source 90 mL TID via Cortrak tube. Receiving Juven BID  Continues with hydrotherapy for DTI sacral wound. 20% wound bed is granulating tissue  Ileostomy with 650 mL out. Net negative 4 L  Vitamin Labs:  Copper: 10 (L-12/01), repeat 20 (L-12/16) Vitamin C: 1.1 (wdl-12/01) Zinc: 148 (H-12/16)=pt had been receiving zinc sulfate which was discontinued Vitamin D 25-hydroxy: 18.37 (L-12/16) Folate: 12.1 (wdl-12/16) Selenium: 111 (wdl-12/16) Vitamin A: 42.3 (wdl-12/16)  Pt received additional Copper supplement,  consider recheck in near future. Receiving Vit D supplementation  Labs: sodium 132 (L) Meds: Vitamin D3, B complex with C, aranesp, ss novolog, novolog q 4 hours, lantus   Diet Order:   Diet Order            Diet NPO time specified  Diet effective midnight                 EDUCATION NEEDS:   Not appropriate for education at this time  Skin:  Skin Assessment: Skin Integrity Issues: Skin Integrity Issues:: Stage II DTI: sacrum Stage I: n/a Stage II: ear Incisions: abdomen  Last BM:  12/21 ileostomy  Height:   Ht Readings from Last 1 Encounters:  09/20/20 5\' 8"  (1.727 m)    Weight:   Wt Readings from Last 1 Encounters:  10/26/20 74 kg    BMI:  Body mass index is 24.81 kg/m.  Estimated Nutritional Needs:   Kcal:  3888-2800 kcals  Protein:  135-175 g  Fluid:  >/= 2 L/day   Kerman Passey MS, RDN, LDN, CNSC Registered Dietitian III Clinical Nutrition RD Pager and On-Call Pager Number Located in [place]

## 2020-10-26 NOTE — Progress Notes (Signed)
ANTICOAGULATION CONSULT NOTE  Pharmacy Consult:  Heparin Indication: atrial fibrillation   Labs: Recent Labs    10/24/20 0459 10/24/20 1441 10/24/20 1725 10/25/20 0506 10/25/20 1556 10/26/20 0459  HGB 7.0*  --   --  7.3*  --  7.4*  HCT 23.9*  --   --  24.7*  --  25.6*  PLT 286  --   --  285  --  293  HEPARINUNFRC 0.34 0.47  --  0.50  --  0.49  CREATININE 0.65  --    < > 0.54 0.51 0.65   < > = values in this interval not displayed.    Assessment: 69 yo female with history of Afib on apixaban prior to admission, which has been held and patient transitioned to IV heparin.  Heparin level is therapeutic; no further bleeding per RN.  Hemoglobin remains low normal, platelet count WNL.  Goal of Therapy:  Heparin level 0.3-0.5 units/ml   Plan:  Continue heparin gtt at 1300 units/hr Daily heparin level and CBC Monitor for bleeding  Manveer Gomes D. Mina Marble, PharmD, BCPS, [place] 10/26/2020, 7:20 AM

## 2020-10-27 ENCOUNTER — Inpatient Hospital Stay (HOSPITAL_COMMUNITY): Payer: Medicare Other

## 2020-10-27 DIAGNOSIS — J9621 Acute and chronic respiratory failure with hypoxia: Secondary | ICD-10-CM

## 2020-10-27 DIAGNOSIS — I4891 Unspecified atrial fibrillation: Secondary | ICD-10-CM | POA: Diagnosis not present

## 2020-10-27 LAB — RENAL FUNCTION PANEL
Albumin: 2.1 g/dL — ABNORMAL LOW (ref 3.5–5.0)
Albumin: 1.9 g/dL — ABNORMAL LOW (ref 3.5–5.0)
Anion gap: 9 (ref 5–15)
Anion gap: 8 (ref 5–15)
BUN: 36 mg/dL — ABNORMAL HIGH (ref 8–23)
BUN: 25 mg/dL — ABNORMAL HIGH (ref 8–23)
CO2: 24 mmol/L (ref 22–32)
CO2: 25 mmol/L (ref 22–32)
Calcium: 10.5 mg/dL — ABNORMAL HIGH (ref 8.9–10.3)
Calcium: 10.3 mg/dL (ref 8.9–10.3)
Chloride: 98 mmol/L (ref 98–111)
Chloride: 100 mmol/L (ref 98–111)
Creatinine, Ser: 0.59 mg/dL (ref 0.44–1.00)
Creatinine, Ser: 0.54 mg/dL (ref 0.44–1.00)
GFR, Estimated: >60 mL/min (ref >60)
GFR, Estimated: >60 mL/min (ref >60)
Glucose, Bld: 210 mg/dL — ABNORMAL HIGH (ref 70–99)
Glucose, Bld: 100 mg/dL — ABNORMAL HIGH (ref 70–99)
Phosphorus: 3.2 mg/dL (ref 2.5–4.6)
Phosphorus: 3.1 mg/dL (ref 2.5–4.6)
Potassium: 3.8 mmol/L (ref 3.5–5.1)
Potassium: 3.3 mmol/L — ABNORMAL LOW (ref 3.5–5.1)
Sodium: 131 mmol/L — ABNORMAL LOW (ref 135–145)
Sodium: 133 mmol/L — ABNORMAL LOW (ref 135–145)

## 2020-10-27 LAB — GLUCOSE, CAPILLARY
Glucose-Capillary: 137 mg/dL — ABNORMAL HIGH (ref 70–99)
Glucose-Capillary: 118 mg/dL — ABNORMAL HIGH (ref 70–99)
Glucose-Capillary: 172 mg/dL — ABNORMAL HIGH (ref 70–99)
Glucose-Capillary: 57 mg/dL — ABNORMAL LOW (ref 70–99)
Glucose-Capillary: 84 mg/dL (ref 70–99)
Glucose-Capillary: 99 mg/dL (ref 70–99)
Glucose-Capillary: 158 mg/dL — ABNORMAL HIGH (ref 70–99)

## 2020-10-27 LAB — HEPARIN LEVEL (UNFRACTIONATED): Heparin Unfractionated: 0.48 IU/mL (ref 0.30–0.70)

## 2020-10-27 LAB — CBC
HCT: 23.7 % — ABNORMAL LOW (ref 36.0–46.0)
Hemoglobin: 7.2 g/dL — ABNORMAL LOW (ref 12.0–15.0)
MCH: 28.7 pg (ref 26.0–34.0)
MCHC: 30.4 g/dL (ref 30.0–36.0)
MCV: 94.4 fL (ref 80.0–100.0)
Platelets: 280 10*3/uL (ref 150–400)
RBC: 2.51 MIL/uL — ABNORMAL LOW (ref 3.87–5.11)
RDW: 16.2 % — ABNORMAL HIGH (ref 11.5–15.5)
WBC: 23.1 10*3/uL — ABNORMAL HIGH (ref 4.0–10.5)
nRBC: 0.8 % — ABNORMAL HIGH (ref 0.0–0.2)

## 2020-10-27 LAB — MAGNESIUM: Magnesium: 2.6 mg/dL — ABNORMAL HIGH (ref 1.7–2.4)

## 2020-10-27 MED ORDER — DEXTROSE 50 % IV SOLN
INTRAVENOUS | Status: AC
  Administered 2020-10-27: 15:00:00 25 mL
  Filled 2020-10-27: qty 50

## 2020-10-27 MED ORDER — PRISMASOL BGK 4/2.5 32-4-2.5 MEQ/L EC SOLN
Status: DC
  Filled 2020-10-27 (×37): qty 5000

## 2020-10-27 MED ORDER — QUETIAPINE FUMARATE 50 MG PO TABS
50.0000 mg | ORAL_TABLET | Freq: Two times a day (BID) | ORAL | Status: DC
  Administered 2020-10-27 – 2020-10-30 (×7): 50 mg
  Filled 2020-10-27 (×7): qty 1

## 2020-10-27 MED ORDER — DEXMEDETOMIDINE HCL IN NACL 400 MCG/100ML IV SOLN
0.1000 ug/kg/h | INTRAVENOUS | Status: AC
  Administered 2020-10-27: 10:00:00 0.5 ug/kg/h via INTRAVENOUS
  Administered 2020-10-27: 23:00:00 0.2 ug/kg/h via INTRAVENOUS
  Administered 2020-10-28 – 2020-10-29 (×5): 0.6 ug/kg/h via INTRAVENOUS
  Administered 2020-10-30: 0.4 ug/kg/h via INTRAVENOUS
  Administered 2020-10-30: 0.7 ug/kg/h via INTRAVENOUS
  Administered 2020-10-31: 0.4 ug/kg/h via INTRAVENOUS
  Filled 2020-10-27 (×11): qty 100

## 2020-10-27 NOTE — Progress Notes (Signed)
ANTICOAGULATION CONSULT NOTE  Pharmacy Consult:  Heparin Indication: atrial fibrillation   Labs: Recent Labs    10/25/20 0506 10/25/20 1556 10/26/20 0459 10/26/20 1718 10/27/20 0452  HGB 7.3*  --  7.4*  --  7.2*  HCT 24.7*  --  25.6*  --  23.7*  PLT 285  --  293  --  280  HEPARINUNFRC 0.50  --  0.49  --  0.48  CREATININE 0.54   < > 0.65 0.57 0.59   < > = values in this interval not displayed.    Assessment: 69 yo female with history of Afib on apixaban prior to admission, which has been held and patient transitioned to IV heparin.  Heparin level is therapeutic; no further bleeding per RN.  Hemoglobin remains low normal, platelet count WNL.  Goal of Therapy:  Heparin level 0.3-0.5 units/ml   Plan:  Continue heparin gtt at 1300 units/hr Daily heparin level and CBC Monitor for bleeding  Barth Kirks, PharmD, BCPS, BCCCP Clinical Pharmacist [phone]  Please check AMION for all [place] numbers  10/27/2020 8:02 AM

## 2020-10-27 NOTE — Progress Notes (Signed)
Progress Note Spoke with ethics team representative who recommends a formal psychiatric evaluation for patient to determine if she has capacity to make decision to end life sustaining treatment.  Consult placed, psychiatry team can reach out if any questions or concerns.  Will update Dr. Lamonte Sakai and unit leadership.  Erskine Emery MD PCCM

## 2020-10-27 NOTE — Progress Notes (Signed)
Family Meeting Note  Met with 2 daughters and another daughter over phone in conference room.  Unit director, Surveyor, quantity, and bedside nurse in attendance as well.  Discussed conversation with patient this AM that she does not want to continue current level of care even if it means she will pass away.  Immediately told that providers are not allowed to ask Itzelle this question unless all daughters are present.  Tried to gently explain autonomy to family and was confronted with myriad of issues family has had with patient's care since admission.  There are a lot of complex ethical issues here that I am going to ask our Ethics Committee to help navigate, including this case meeting criteria for medical futility which we are certainly close to reaching.  Hopefully a third party can help clarify family's concerns and match our care better to their expectations while respecting Daphine's autonomy.  In meantime, as patient's family stated multiple times they do not want her at this hospital, we encouraged them to reach out to other facilities to find an accepting physician.  This case has been run by [facility] multiple weeks ago and was declined for transfer as nothing additional can be offered.  I have also been asked to not see the patient anymore.  Dr. Lamonte Sakai will assume care as attending starting immediately.  Erskine Emery MD PCCM

## 2020-10-27 NOTE — Progress Notes (Signed)
[facility] NEPHROLOGY PROGRESS NOTE  Assessment/ Plan:  # AKI- oliguric presumably due to ischemic ATN in setting of hemorrhagic shock. Started on CRRT 09/08/20 due to persistent oliguria and hyperkalemia. All fluids 4K/2.5Ca: Pre-filter to 500 ml/hr, post-filter 200 ml/hr, and dialysate to 1000 ml/hr. Keeping even to 50 cc/hr. No anticoagulation due to thrombocytopenia and ongoing GI blood loss.  Potassium level improved.  Continue dialysate potassium  2K, prefilter and post filter 4K bath.  Monitor lab.  UF net even to 50 cc/hour as tolerated by BP. Pt has deteriorated several times when trying to transition to IHD.  Tried again on 12/24 which unfortunately failed again- only lasted 12 hours.... this is an issue-  Not sure when we will be able to transition her- cannot keep her on CRRT forever. RIJ TDC placed 10/19/20 by IR.   Blood pressure dropped to 50 systolic this morning.  Back on Levophed.  At this condition, she not a candidate for outpatient dialysis and very poor long-term prognosis.  I recommend comfort/hospice care.  Discussed with ICU team.  # Hemorrhagic shock- ongoing blood loss and requiring pressors. Transfuse as needed, have added ESA.  # Septic shock due to MRSA pneumonia.  # Perforated duodenal ulcer- s/p exploratory lap and Graham patch placement per surgery.  # Acute hypoxic/hypercapnic respiratory failure due to ARDS from Recent Covid-19 PNA- currently on vent via trach per PCCM. low o2.  # P. Atrial fibrillation- no anticoagulation due to GI bleed.  # Hyponatremia- worsened quickly off of CRRT-  manage with crrt.   # Severe protein malnutrition- per primary svc.  # Disposition- poor overall prognosis and will likely require LTAC and chronic vent support if we can ever get her transitioned to IHD.  This is unlikely at this time. PCCM tohave ongoinggoals of care [place] family.  Subjective: Seen and examined in ICU.  Opens eyes with  the name.  Blood pressure dropped to systolic 93X this morning.  Back on Levophed.  No other new event.  Discussed with ICU team.  Objective Vital signs in last 24 hours: Vitals:   10/27/20 0700 10/27/20 0723 10/27/20 0753 10/27/20 0800  BP: (!) 85/57   (!) 58/14  Pulse: 100   (!) 110  Resp: (!) 30   (!) 29  Temp:  98.8 F (37.1 C)    TempSrc:  Axillary    SpO2: 100%  98% 99%  Weight:      Height:       Weight change: 0 kg  Intake/Output Summary (Last 24 hours) at 10/27/2020 0854 Last data filed at 10/27/2020 0800 Gross per 24 hour  Intake 2953.69 ml  Output 2905 ml  Net 48.69 ml       Labs: Basic Metabolic Panel: Recent Labs  Lab 10/26/20 0459 10/26/20 1718 10/27/20 0452  NA 132* 134* 131*  K 5.0 4.2 3.8  CL 99 100 98  CO2 _0 GLUCOSE 156* 114* 210*  BUN 39* 37* 36*  CREATININE 0.65 0.57 0.59  CALCIUM 10.1 10.2 10.5*  PHOS 3.5 2.8 3.2   Liver Function Tests: Recent Labs  Lab 10/26/20 0459 10/26/20 1718 10/27/20 0452  ALBUMIN 2.1* 2.1* 2.1*   No results for input(s): LIPASE, AMYLASE in the last 168 hours. No results for input(s): AMMONIA in the last 168 hours. CBC: Recent Labs  Lab 10/23/20 0454 10/24/20 0459 10/25/20 0506 10/26/20 0459 10/27/20 0452  WBC 28.5* 18.7* 16.4* 15.4* 23.1*  HGB 7.3* 7.0* 7.3* 7.4* 7.2*  HCT 24.3* 23.9* 24.7* 25.6* 23.7*  MCV 96.0 96.4 95.7 96.6 94.4  PLT 314 286 285 293 280   Cardiac Enzymes: Recent Labs  Lab 10/22/20 1248  CKTOTAL 26*  CKMB 15.4*   CBG: Recent Labs  Lab 10/26/20 1654 10/26/20 2013 10/26/20 2341 10/27/20 0342 10/27/20 0721  GLUCAP 110* 248* 95 137* 118*    Iron Studies: No results for input(s): IRON, TIBC, TRANSFERRIN, FERRITIN in the last 72 hours. Studies/Results: DG Chest Port 1 View  Result Date: 10/27/2020 CLINICAL DATA:  69 year old female respiratory failure. Hypoxia. COVID-19. EXAM: PORTABLE CHEST 1 VIEW COMPARISON:  Portable chest 10/22/2020 and earlier. FINDINGS:  Portable AP semi upright view at 0438 hours. Stable tracheostomy tube, dual lumen right chest catheter, right PICC and visible enteric tube. Mildly lower lung volumes. Mediastinal contours are stable and within normal limits. Coarse and confluent bibasilar and right mid lung opacity is stable. Left upper lobe relatively spared. No pneumothorax. No definite effusion. Ventilation has not significantly changed since 10/20/2020. No acute osseous abnormality identified. IMPRESSION: 1. Stable lines and tubes. 2. Ventilation not significantly changed since 10/20/2020. Confluent bibasilar opacity. Electronically Signed   By: Genevie Ann M.D.   On: 10/27/2020 07:06    Medications: Infusions: .  prismasol BGK 4/2.5 500 mL/hr at 10/26/20 1905  .  prismasol BGK 4/2.5 200 mL/hr at 10/26/20 0428  . sodium chloride Stopped (10/27/20 0526)  . sodium chloride    .  ceFAZolin (ANCEF) IV Stopped (10/27/20 0539)  . feeding supplement (VITAL 1.5 CAL) 1,000 mL (10/27/20 0621)  . heparin 1,300 Units/hr (10/27/20 0502)  . norepinephrine (LEVOPHED) Adult infusion Stopped (10/26/20 0052)  . prismasol BGK 2/2.5 dialysis solution 1,000 mL/hr at 10/27/20 0257    Scheduled Medications: . B-complex with vitamin C  1 tablet Per Tube Daily  . chlorhexidine gluconate (MEDLINE KIT)  15 mL Mouth Rinse BID  . Chlorhexidine Gluconate Cloth  6 each Topical Daily  . cholecalciferol  2,000 Units Per Tube Daily  . clonazepam  1 mg Per Tube BID  . [START ON 10/28/2020] collagenase   Topical Daily  . darbepoetin (ARANESP) injection - DIALYSIS  100 mcg Subcutaneous Q Tue-1800  . docusate  100 mg Per Tube BID  . feeding supplement (PROSource TF)  90 mL Per Tube TID  . guaiFENesin  15 mL Per Tube Q12H  .  HYDROmorphone (DILAUDID) injection  1 mg Intravenous Q4H  . insulin aspart  0-15 Units Subcutaneous Q4H  . insulin aspart  7 Units Subcutaneous Q4H  . insulin glargine  40 Units Subcutaneous Daily  . mouth rinse  15 mL Mouth Rinse QID   . midodrine  30 mg Per Tube TID WC  . nutrition supplement (JUVEN)  1 packet Per Tube BID BM  . pantoprazole sodium  40 mg Per Tube BID  . polyethylene glycol  17 g Per Tube Daily  . QUEtiapine  50 mg Per Tube BID  . sodium chloride flush  10-40 mL Intracatheter Q12H  . sodium hypochlorite   Topical BID    have reviewed scheduled and prn medications.  Physical Exam: Not much change from yesterday. General: Critically ill looking female with a tracheostomy, opens eyes with the name Heart:RRR, s1s2 nl Lungs: Coarse breath sound bilateral Abdomen:soft,  non-distended Extremities:No edema Dialysis Access: Right IJ TDC  Candace [name] Candace [name] Lucia Harm 10/27/2020,8:54 AM  LOS: 56 days

## 2020-10-27 NOTE — Progress Notes (Signed)
Physical Therapy Wound Treatment Patient Details  Name: Candace [name] MRN: [id number] Date of Birth: Feb 03, 1951  Today's Date: 10/27/2020 Time: 8264-1583 Time Calculation (min): 30 min  Subjective  Subjective: Lethargic on vent with intermittent eyes open. Patient and Family Stated Goals: Heal wound; maintain pt comfort per daughter Date of Onset:  (Unknown)  Pain Score:  Pt was premedicated and appeared to tolerate treatment well without obvious signs of pain.   Wound Assessment  Pressure Injury 09/25/20 Sacrum Bilateral;Medial Deep Tissue Pressure Injury - Purple or maroon localized area of discolored intact skin or blood-filled blister due to damage of underlying soft tissue from pressure and/or shear. Purple, non-blanchable, b (Active)  Dressing Type ABD;Barrier Film (skin prep);Gauze (Comment);Moist to dry 10/27/20 1340  Dressing Changed;Clean;Dry;Intact 10/27/20 1340  Dressing Change Frequency Daily 10/27/20 1340  State of Healing Eschar 10/27/20 1340  Site / Wound Assessment Yellow;Red;Black 10/27/20 1340  % Wound base Red or Granulating 20% 10/27/20 1340  % Wound base Yellow/Fibrinous Exudate 60% 10/27/20 1340  % Wound base Black/Eschar 20% 10/27/20 1340  % Wound base Other/Granulation Tissue (Comment) 0% 10/27/20 1340  Peri-wound Assessment Excoriated;Maceration 10/27/20 1340  Wound Length (cm) 16 cm 10/20/20 0941  Wound Width (cm) 11 cm 10/20/20 0941  Wound Depth (cm) 4 cm 10/20/20 0941  Wound Surface Area (cm^2) 176 cm^2 10/20/20 0941  Wound Volume (cm^3) 704 cm^3 10/20/20 0941  Tunneling (cm) 0 10/13/20 1200  Undermining (cm) 0 10/13/20 1200  Margins Unattached edges (unapproximated) 10/27/20 1340  Drainage Amount Minimal 10/27/20 1340  Drainage Description Serosanguineous 10/27/20 1340  Treatment Debridement (Selective);Hydrotherapy (Pulse lavage);Packing (Dakin's soaked gauze) 10/27/20 1340      Hydrotherapy Pulsed lavage therapy - wound location: sacrum Pulsed  Lavage with Suction (psi): 12 psi Pulsed Lavage with Suction - Normal Saline Used: 1000 mL Pulsed Lavage Tip: Tip with splash shield Selective Debridement Selective Debridement - Location: sacrum Selective Debridement - Tools Used: Forceps;Scissors Selective Debridement - Tissue Removed: eschar; non-viable tissue   Wound Assessment and Plan  Wound Therapy - Assess/Plan/Recommendations Wound Therapy - Clinical Statement: Progressing with debridement of unviable tissue, despite large amount of debridement we have achieved throughout our course of hydrotherapy, we are still not down to healthy tissue yet. Will continue to follow for selective removal of unviable tissue, to decrease bioburden, and promote wound bed healing. Wound Therapy - Functional Problem List: Global weakness and immobility Factors Delaying/Impairing Wound Healing: Diabetes Mellitus;Immobility;Multiple medical problems Hydrotherapy Plan: Debridement;Dressing change;Patient/family education;Pulsatile lavage with suction Wound Therapy - Frequency: 6X / week Wound Therapy - Follow Up Recommendations: Skilled nursing facility Wound Plan: See above  Wound Therapy Goals- Improve the function of patient's integumentary system by progressing the wound(s) through the phases of wound healing (inflammation - proliferation - remodeling) by: Decrease Necrotic Tissue to: 20% Decrease Necrotic Tissue - Progress: Progressing toward goal Increase Granulation Tissue to: 80% Increase Granulation Tissue - Progress: Progressing toward goal Goals/treatment plan/discharge plan were made with and agreed upon by patient/family: No, Patient unable to participate in goals/treatment/discharge plan and family unavailable Time For Goal Achievement: 7 days Wound Therapy - Potential for Goals: Fair  Goals will be updated until maximal potential achieved or discharge criteria met.  Discharge criteria: when goals achieved, discharge from hospital, MD  decision/surgical intervention, no progress towards goals, refusal/missing three consecutive treatments without notification or medical reason.  GP     Thelma Comp 10/27/2020, 1:50 PM  Rolinda Roan, PT, DPT Acute Rehabilitation Services Pager: [phone] Office: [phone]

## 2020-10-27 NOTE — Progress Notes (Signed)
Pt transferred to 2M07. Daughter, [name] called and notified.

## 2020-10-27 NOTE — Consult Note (Signed)
  69 y.o. female  with past medical history of HTN, HLD, DM, recent COVID-19 infection 08/23/20 admitted on 08/31/2020 with hypoxia and atrial fibrillation RVR. Admitted to medical floor and started on Cardizem infusion and Eliquis. Hospital course complicated by abdominal pain, found to have perforated duodenal ulcer s/p explaratory lap, lysis of adhesions, Graham patch placement. Developed bleeding from surgical site post op day 6. 11/10 ICU transfer due to hemorrhagic shock, intubated, transfused, pressors, proned, and started on CRRT. Extubated 11/16 and re-intubated that evening due to respiratory distress, hypoxia, and altered mental status. Remains critically ill, intubated on mechanical ventilation. Ongoing CRRT with plans to attempt iHD per nephrology. Palliative medicine consultation for goals of care.   Psych consult was placed for mental capacity evaluation to assess if patient is able to make medical decisions regarding life-sustaining measures.  To avoid any additional influence on misrepresentation it was requested this be completed without family being present at the bedside.  Upon entry into the room this nurse practitioner identified herself, and the reason for the visit.  At her bedside for her 2 daughters [place] and [place] both who did not appear as cooperative to leave their mother's bedside for the evaluation.  At that time this nurse practitioner requested the eldest of can to step out of the room for further discussion regarding reason for visit.  Daughter was initially very defensive and guarded, however was reasonable and willing to talk and have a conversation.  She proceeds to tell this Probation officer everything that has taken place over the past 60 days, to include opinions by her medical team to discontinue her care.  Daughter does appear to have a certain degree of understanding in terms of life-sustaining measures, however she also reports that her mother is wanting to live.  After very  lengthy discussion the daughter did allow me to complete evaluation, in which her and her sister stepped out of the room.   Upon entry into the room this nurse practitioner again identified self, in which patient responded by a small grin.  Explained to patient my reason for visit, and asked permission to proceed with capacity evaluation.  At that time I asked patient if her name, in which she mouthed.  Writer then asked her date of birth, at that time patient did begin to move her lips but was unable to vocalize words.  Further clarification was sought by her bedside nurse who stated they have been reading her lips, and asking her yes or no questions.  Writer then inquired about written communication in which the nurse said due to her physical deconditioning and some muscular atrophy she is unable to write at this time.  It was at this time nurse practitioner ended the attempt to evaluate for mental capacity.  We are unable to complete mental capacity evaluation for patient who is nonverbal, currently on Precedex, and trach (no PMV available).  Will need to contact ethics on next of kin to determine decision-making. It is understood this is a very complex case that has been complicated by prolong life sustaining measures to include pressors, ventilation, and CRRT

## 2020-10-27 NOTE — Progress Notes (Signed)
   10/27/20 0753  Tracheostomy Shiley Flexible 8 mm Cuffed  Placement Date: (c)   Placed By: (c) Other (Comment)  Brand: Shiley Flexible  Size (mm): 8 mm  Style: Cuffed  Status Secured  Site Assessment Clean  Ties Assessment Secure  Cuff pressure (cm)  (mov)  Tracheostomy Equipment at bedside Yes and checklist posted at head of bed  Adult Ventilator Settings  Vent Type Servo i  Humidity HME  Vent Mode PRVC  Vt Set 450 mL  Set Rate 28 bmp  FiO2 (%) 40 %  I:E Ratio Set .9  PEEP 5 cmH20  Adult Ventilator Measurements  Peak Airway Pressure 9 L/min  Mean Airway Pressure 8 cmH20  Resp Rate Spontaneous 6 br/min  Resp Rate Total 34 br/min  Exhaled Vt 440 mL  Measured Ve 14.3 mL  I:E Ratio Measured 1:1  Auto PEEP 0 cmH20  Total PEEP 5 cmH20  SpO2 98 %  Adult Ventilator Alarms  Alarms On Y  Ve High Alarm 20 L/min  Ve Low Alarm 4 L/min  Resp Rate High Alarm 45 br/min  Resp Rate Low Alarm 8  PEEP Low Alarm 2 cmH2O  Press High Alarm 50 cmH2O  VAP Prevention  Equipment wiped down Yes  Daily Weaning Assessment  Daily Assessment of Readiness to Wean Wean protocol criteria met (SBT performed)  SBT Method CPAP 5 cm H20 and PS 5 cm H20  Weaning Start Time [phone]  Patient response Failed SBT terminated  Reason SBT Terminated RR > 35 breaths/min or Frequency/TV ratio >105  Breath Sounds  Bilateral Breath Sounds Rhonchi  Airway Suctioning/Secretions  Suction Type Tracheal  Suction Device  Catheter  Secretion Amount Moderate  Secretion Color White  Secretion Consistency Thick  Suction Tolerance Tolerated well  Suctioning Adverse Effects None

## 2020-10-27 NOTE — Progress Notes (Signed)
NAMELatronda [name], MRN:  [id number], DOB:  1951-03-10, LOS: 35 ADMISSION DATE:  08/31/2020, CONSULTATION DATE:  09/07/2020 REFERRING MD:  Dr Candace [name], CHIEF COMPLAINT:  Acute resp failure  Brief History   69 year old female who was previously diagnosed with Covid 08/23/2020.  Admitted 11/2 with AF-RVR, found to have a perforated duodenal underwent exploratory laparotomy with Phillip Heal patch placement.  Past Medical History  Covid pneumonia Atrial fibrillation CKD stage III Diabetes mellitus Hypertension Colon cancer Hyperlipidemia  Significant Hospital Events   11/2 Admitted  11/3 OR with findings of perforated duodenal ulcer 11/10 progressive hemorrhagic shock, intubated, transfused, pressors, proned; started on CRRT in PM 11/03 Exploratory laparotomy, Phillip Heal patch, lysis of adhesion for duodenal ulceration postop day 6 11/16 Extubated. Re-intubated overnight due to respiratory distress and hypoxia with decreased mentation 11/18 Bronch, cultures sent 11/19 Hgb down getting blood 11/24 Spiked fever resume empirical antimicrobial therapy 11/26 Hemorrhagic shock, hgb 5.6, increased pressors, CT A/P  11/30 Per palliative "Candace [name] expresses understanding that patient is unfortunately very fragile despite ongoing intensive medical treatment and full mechanical support. She indicates that the family wants to continue with all current interventions despite potential outcomes". 12/08 CRRT discontinued due to clotting 12/09 Family requested transfer to tertiary care [facility] denied transfer  12/10 CRRT restarted. Episodes of tachycardia, tachypnea that seem to improve with pain management 12/11 Back in shock. Pressor requirements up. CXR worse. ABX resumed 12/12 Still requiring inc pressors. Had hypoglycemic event. Basal insulin dosing adjusted 12/13 Pressor requirements better. Now hyperglycemic. Re-adjusted Glycemic control  12/14 Changed dilaudid to1/2 dosing from day further. D/c  vasopressin.  Goals of care reconfirmed with daughter.  Patient continues to desire aggressive care.  Not open to discussing any other option, patient family continues to be hopeful that she will be discharged to home with full recovery in spite of multiple attempts by staff to prepare them that this is unlikely scenario 12/15 Dilaudid discontinued sending cortisol for ongoing pressor dependence 12/16 Serum cortisol <20, added stress dose steroids.  PRN Dilaudid, attempting not go back on Dilaudid infusion 12/17 Developed worsening tachycardia during the evening hours received initially IV albumin, followed by resuming IV Dilaudid with question of suboptimal pain control.  Currently looks better back on Dilaudid drip.  We have been able to wean pressors after adding stress dose steroids; near arrest - bradycardia, better with atropine  12/19 Afebrile . Remains on dilaudid and heparin gtt, dilaudid gtt increased overnight for concern of pain / ongoing tachycardia, no other events . NE and precedex off 12/17. Ongoing CRRT- even UF, dosing lokelmia this morning 12/20 On CRRT.  Renal plans for HD cath and moving to HD. Getting wound care  12/23 -No vent weaning per RT, remains on full support, #8 trach in place but previously had #6, no documentation of change noted in chart Afebrile / WBC 20.4  Vent - 30% FiO2, PEEP 5 Glucose range 176-212 I/O 465 ml stool, 3.6L removed with HD, -1.1L in last 24 hours  RN reports ongoing periods of tachypnea / vent dyssynchrony that responds to dilaudid    12/24 - renal stopping CRRT today and plans HD 10/24/20 . 40% fio2 on vent via Trach. TAchypenic and tachycardic. Afebrile but wbc up to 27.6K. On TF. Onn heparin gtt  12/25 - Back on CRRT. On vent via trach at 40% fio2, On scheduled dilaudid as add on to oxy. Pper family request 12/24 - they felt scheduled oxy was not adequate and patien was showing  signs of opioid withdrawal.  Patient also had  worsening  SIRS/sepsis syndrome. Had fever, rising wbc, worsening RR 40-60 and HR 140s sinsu-> strated On abx yesterday. Fever some better today. WBC plateau at 28,.5K. On new levophed -> signifanct improvement in HR 77 andRR t0 20. On heparin gtt.  On precedex gtt. On levophed gtt 61mcg wthi midodrine. On TF   Consults:  Cardiology [facility] Nephrology   Procedures:  R PICC 11/5 >> A line 11/9 >> out ETT 11/9 > 11/16, 11/16 >> 09/21/2020, 09/21/2020 tracheostomy>> Lt [place] CVL 11/9 >> R IJ trialysis >> out HD catheter 12/1 >>12/20 12/21 - 14.5 Fr, 23 cm right IJ tunneled hemodialysis catheter placement.  Removal of indwelling subclavian catheter.   Significant Diagnostic Tests:  11/3 CT abd/ pelvis > 1. Positive for bowel perforation: Pneumoperitoneum and intermediate density free fluid in the abdomen. Prior total colectomy. The specific site of perforation is unclear-oral contrast present to the proximal jejunum has not obviously leaked. Note that there may be small bowel loops adherent to the ventral abdominal wall along the greater curve of the stomach. 2. Extensive bilateral lower lung pneumonia. No pleural effusion. 3. Other abdominal and pelvic viscera are stable since 2015, including bilateral adrenal adenomas. Chronic renal parapelvic cysts. 4. Aortic Atherosclerosis 11/3 TTE > EF 70-75%, RV not well visualized, mildly reduced RV systolic function 78/24 CT chest/ abd/ pelvis> 1. Interval progression of diffuse bilateral hazy ground-glass airspace opacities with more focal areas of consolidation at the lung bases 2. Trace bilateral pleural effusions. 3. Postsurgical changes the abdomen as detailed above. No evidence for a postoperative abscess, however evaluation is limited by lack of IV contrast. 4. There is a 1.9 cm cystic appearing lesion located in the pancreatic body. This was not present on the patient's CT from 2015.  Follow-up with an outpatient contrast enhanced MRI is recommended. 5. The  endometrial stripe appears diffusely thickened. Follow-up with pelvic ultrasound is recommended. Aortic Atherosclerosis 11/14 LE doppler studies > + DVT of right posterior tibial and peroneal vein, +dVT of left posterior tibial vein  11/26 CT ABD PEL > liver unremarkable, distended gallbladder with layering tiny gallstones versus sludge, no duct dilatation, mild hyperdensity right upper pole renal collecting system new.  No evidence of retroperitoneal bleeding.  Multifocal lower lobe predominant pulmonary infiltrates/pneumonia.  Small left pleural effusion.  Micro Data:  11/10 MRSA PCR > neg 11/10 BC x 2 > neg 09/22/2020 blood cultures x2>> S epi.  09/22/2020 sputum culture>> MRSA Blood 12/1 >> negative. S.epi 1 out of 2, likely contaminant BCx 2 12/12 >> neg xxx Trach aspirate 12/24 - FEW STAPHYLOCOCCUS AUREUS  Blood 12/24  Antimicrobials:  azithro 11/2 >11/3 Ceftriaxone 11/2  Fluconazole 11/3 Zosyn 11/3 >> 11/7 Vanc 11/10 off Cefepime 11/10 > 11/16 09/22/2020 vancomycin for MRSA PNA >> 12/2 xxxx Vanc 12/11> 12/17 Zosyn 12/11> 12/17 xxxx vanc 12/24 (staph resp cutlure) >> 12/28 Zosyn 12/24 - 12/27 Cefazolin 12/27 >>   Subjective:  Back on pressors, still on CRRT  Objective   Blood pressure (!) 58/14, pulse (!) 110, temperature 98.8 F (37.1 C), temperature source Axillary, resp. rate (!) 29, height 5\' 8"  (1.727 m), weight 74 kg, SpO2 99 %.    Vent Mode: PRVC FiO2 (%):  [40 %] 40 % Set Rate:  [28 bmp] 28 bmp Vt Set:  [450 mL] 450 mL PEEP:  [5 cmH20] 5 cmH20 Plateau Pressure:  [25 cmH20-30 cmH20] 25 cmH20   Intake/Output Summary (Last 24 hours) at  10/27/2020 0824 Last data filed at 10/27/2020 0800 Gross per 24 hour  Intake 2953.69 ml  Output 2905 ml  Net 48.69 ml   Filed Weights   10/25/20 0417 10/26/20 0500 10/27/20 0225  Weight: 74.9 kg 74 kg 74 kg    Constitutional: deconditioned depressed appearing woman on vent  Eyes: EOMI, pupils equal Ears, nose,  mouth, and throat: trach in place with copious thick secretions Cardiovascular: RRR, ext warm Respiratory: Scattered rhonci, triggering vent Gastrointestinal: Soft, +BS Skin: No rashes, normal turgor Neurologic: profoundly weak Psychiatric: RASS 0  Chemistries look okay on CRRT Anemia stable WBC up   Resolved problems:  Previously hemorrhagic shock requiring PRBCs, Septic shock, MRSA pneumonia s/p 8 days abx Complicated by MRSA healthcare associated pneumonia (treated) Peritonitis and perforated ulcer post repair 09/01/20  Assessment:   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  I ask if she wants to be allowed to pass in peace, she nodes "yes."  I ask if she is willing to discuss with her daughter, she nods "yes."  Going to have daughters come in to talk and may need ethics involvement.  Best practice:  Diet: TF Pain/Anxiety/Delirium protocol (if indicated) dilaudid IV pushes/ enteral sedation-  clonazepam, oxy, seroquel VAP protocol (if indicated): yes DVT prophylaxis: IV heparin GI prophylaxis: Protonix Glucose control: SSI, TF coverage, lantus Mobility: Bedrest   Patient critically ill due to shock, renal failure, respiratory failure Interventions to address this today family discussions Risk of deterioration without these interventions is high  I personally spent 43 minutes providing critical care not including any separately billable procedures  Erskine Emery MD [facility] 10/27/2020 8:46 AM Personal pager: [phone] If unanswered, please page CCM On-call: [phone]

## 2020-10-27 NOTE — Progress Notes (Signed)
Pt transported from 3M07 to 2M07 without any complications.

## 2020-10-27 NOTE — Progress Notes (Signed)
Assisted tele visit to patient with daughter Marlowe Kays and other family members in her home  Maryelizabeth Rowan, RN

## 2020-10-28 DIAGNOSIS — U071 COVID-19: Secondary | ICD-10-CM | POA: Diagnosis not present

## 2020-10-28 DIAGNOSIS — R579 Shock, unspecified: Secondary | ICD-10-CM | POA: Diagnosis not present

## 2020-10-28 DIAGNOSIS — J9600 Acute respiratory failure, unspecified whether with hypoxia or hypercapnia: Secondary | ICD-10-CM | POA: Diagnosis not present

## 2020-10-28 LAB — CBC
HCT: 23.4 % — ABNORMAL LOW (ref 36.0–46.0)
Hemoglobin: 7.2 g/dL — ABNORMAL LOW (ref 12.0–15.0)
MCH: 28.9 pg (ref 26.0–34.0)
MCHC: 30.8 g/dL (ref 30.0–36.0)
MCV: 94 fL (ref 80.0–100.0)
Platelets: 273 10*3/uL (ref 150–400)
RBC: 2.49 MIL/uL — ABNORMAL LOW (ref 3.87–5.11)
RDW: 16 % — ABNORMAL HIGH (ref 11.5–15.5)
WBC: 16 10*3/uL — ABNORMAL HIGH (ref 4.0–10.5)
nRBC: 2.4 % — ABNORMAL HIGH (ref 0.0–0.2)

## 2020-10-28 LAB — RENAL FUNCTION PANEL
Albumin: 2 g/dL — ABNORMAL LOW (ref 3.5–5.0)
Albumin: 2.1 g/dL — ABNORMAL LOW (ref 3.5–5.0)
Anion gap: 10 (ref 5–15)
Anion gap: 10 (ref 5–15)
BUN: 24 mg/dL — ABNORMAL HIGH (ref 8–23)
BUN: 27 mg/dL — ABNORMAL HIGH (ref 8–23)
CO2: 23 mmol/L (ref 22–32)
CO2: 24 mmol/L (ref 22–32)
Calcium: 10.3 mg/dL (ref 8.9–10.3)
Calcium: 10.5 mg/dL — ABNORMAL HIGH (ref 8.9–10.3)
Chloride: 98 mmol/L (ref 98–111)
Chloride: 101 mmol/L (ref 98–111)
Creatinine, Ser: 0.61 mg/dL (ref 0.44–1.00)
Creatinine, Ser: 0.54 mg/dL (ref 0.44–1.00)
GFR, Estimated: >60 mL/min (ref >60)
GFR, Estimated: >60 mL/min (ref >60)
Glucose, Bld: 194 mg/dL — ABNORMAL HIGH (ref 70–99)
Glucose, Bld: 122 mg/dL — ABNORMAL HIGH (ref 70–99)
Phosphorus: 3.5 mg/dL (ref 2.5–4.6)
Phosphorus: 3 mg/dL (ref 2.5–4.6)
Potassium: 3.7 mmol/L (ref 3.5–5.1)
Potassium: 4.2 mmol/L (ref 3.5–5.1)
Sodium: 131 mmol/L — ABNORMAL LOW (ref 135–145)
Sodium: 135 mmol/L (ref 135–145)

## 2020-10-28 LAB — MAGNESIUM: Magnesium: 2.6 mg/dL — ABNORMAL HIGH (ref 1.7–2.4)

## 2020-10-28 LAB — CULTURE, BLOOD (ROUTINE X 2)
Culture: NO GROWTH
Culture: NO GROWTH

## 2020-10-28 LAB — GLUCOSE, CAPILLARY
Glucose-Capillary: 209 mg/dL — ABNORMAL HIGH (ref 70–99)
Glucose-Capillary: 164 mg/dL — ABNORMAL HIGH (ref 70–99)
Glucose-Capillary: 188 mg/dL — ABNORMAL HIGH (ref 70–99)
Glucose-Capillary: 143 mg/dL — ABNORMAL HIGH (ref 70–99)
Glucose-Capillary: 213 mg/dL — ABNORMAL HIGH (ref 70–99)
Glucose-Capillary: 155 mg/dL — ABNORMAL HIGH (ref 70–99)

## 2020-10-28 LAB — HEPARIN LEVEL (UNFRACTIONATED): Heparin Unfractionated: 0.42 IU/mL (ref 0.30–0.70)

## 2020-10-28 MED ORDER — ONDANSETRON HCL 4 MG/2ML IJ SOLN
4.0000 mg | Freq: Four times a day (QID) | INTRAMUSCULAR | Status: DC | PRN
  Administered 2020-10-28 – 2020-11-22 (×13): 4 mg via INTRAVENOUS
  Filled 2020-10-28 (×13): qty 2

## 2020-10-28 MED ORDER — DAKINS (1/4 STRENGTH) 0.125 % EX SOLN
Freq: Two times a day (BID) | CUTANEOUS | Status: AC
  Filled 2020-10-28: qty 473

## 2020-10-28 MED ORDER — INFLUENZA VAC A&B SA ADJ QUAD 0.5 ML IM PRSY
0.5000 mL | PREFILLED_SYRINGE | INTRAMUSCULAR | Status: DC
  Filled 2020-10-28: qty 0.5

## 2020-10-28 MED ORDER — ORAL CARE MOUTH RINSE
15.0000 mL | OROMUCOSAL | Status: DC
  Administered 2020-10-28 – 2020-11-11 (×132): 15 mL via OROMUCOSAL

## 2020-10-28 MED ORDER — COLLAGENASE 250 UNIT/GM EX OINT
TOPICAL_OINTMENT | Freq: Every day | CUTANEOUS | Status: DC
  Administered 2020-11-02: 1 via TOPICAL
  Filled 2020-10-28: qty 30

## 2020-10-28 MED FILL — Medication: Qty: 1 | Status: AC

## 2020-10-28 NOTE — Plan of Care (Signed)
  Problem: Nutrition: Goal: Adequate nutrition will be maintained Outcome: Progressing   Problem: Elimination: Goal: Will not experience complications related to bowel motility Outcome: Progressing   Problem: Activity: Goal: Risk for activity intolerance will decrease Outcome: Not Progressing Note: Pt remains bedbound due to CRRT, Ventilator, and deconditioning. Pt is actively working with PT/OT.   Problem: Activity: Goal: Ability to tolerate increased activity will improve Outcome: Not Progressing

## 2020-10-28 NOTE — Progress Notes (Signed)
Assisted tele visit to patient with daughter.  Kemari Narez R Sherleen Pangborn, RN   

## 2020-10-28 NOTE — Progress Notes (Signed)
Physical Therapy Wound Treatment Patient Details  Name: Candace [name] MRN: [id number] Date of Birth: May 27, 1951  Today's Date: 10/28/2020 Time: 9381-0175 Time Calculation (min): 25 min  Subjective  Subjective: Lethargic on vent with intermittent eyes open. Patient and Family Stated Goals: Heal wound; maintain pt comfort per daughter Date of Onset:  (Unknown)  Pain Score:  Pt not able to be premedicated due to hypotension, however tolerated treatment well without indication of discomfort or pain.   Wound Assessment  Pressure Injury 09/25/20 Sacrum Bilateral;Medial Deep Tissue Pressure Injury - Purple or maroon localized area of discolored intact skin or blood-filled blister due to damage of underlying soft tissue from pressure and/or shear. Purple, non-blanchable, b (Active)  Dressing Type ABD;Barrier Film (skin prep);Gauze (Comment);Moist to dry 10/28/20 0959  Dressing Changed;Clean;Dry;Intact 10/28/20 0959  Dressing Change Frequency Daily 10/28/20 0959  State of Healing Eschar 10/28/20 0959  Site / Wound Assessment Yellow;Black;Pink 10/28/20 0959  % Wound base Red or Granulating 20% 10/28/20 0959  % Wound base Yellow/Fibrinous Exudate 70% 10/28/20 0959  % Wound base Black/Eschar 10% 10/28/20 0959  % Wound base Other/Granulation Tissue (Comment) 0% 10/28/20 0959  Peri-wound Assessment Excoriated 10/28/20 0959  Wound Length (cm) 16 cm 10/27/20 1000  Wound Width (cm) 11 cm 10/27/20 1000  Wound Depth (cm) 4 cm 10/27/20 1000  Wound Surface Area (cm^2) 176 cm^2 10/27/20 1000  Wound Volume (cm^3) 704 cm^3 10/27/20 1000  Tunneling (cm) 0 10/13/20 1200  Undermining (cm) 0 10/13/20 1200  Margins Unattached edges (unapproximated) 10/28/20 0959  Drainage Amount Minimal 10/28/20 0959  Drainage Description Serosanguineous 10/28/20 0959  Treatment Debridement (Selective);Hydrotherapy (Pulse lavage);Packing (Dakin's soaked gauze) 10/28/20 0959      Hydrotherapy Pulsed lavage therapy - wound  location: sacrum Pulsed Lavage with Suction (psi): 12 psi Pulsed Lavage with Suction - Normal Saline Used: 1000 mL Pulsed Lavage Tip: Tip with splash shield Selective Debridement Selective Debridement - Location: sacrum Selective Debridement - Tools Used: Forceps;Scalpel Selective Debridement - Tissue Removed: eschar; non-viable tissue   Wound Assessment and Plan  Wound Therapy - Assess/Plan/Recommendations Wound Therapy - Clinical Statement: Day 3 of Dakin's solution. Reached out to [place], [facility] for renewal of Dakin's for 3 more days. Minimal pink tissue starting to emerge through necrotic adipose. Will continue to follow for selective removal of unviable tissue, to decrease bioburden, and promote wound bed healing. Wound Therapy - Functional Problem List: Global weakness and immobility Factors Delaying/Impairing Wound Healing: Diabetes Mellitus;Immobility;Multiple medical problems Hydrotherapy Plan: Debridement;Dressing change;Patient/family education;Pulsatile lavage with suction Wound Therapy - Frequency: 6X / week Wound Therapy - Follow Up Recommendations: Skilled nursing facility Wound Plan: See above  Wound Therapy Goals- Improve the function of patient's integumentary system by progressing the wound(s) through the phases of wound healing (inflammation - proliferation - remodeling) by: Decrease Necrotic Tissue to: 20% Decrease Necrotic Tissue - Progress: Progressing toward goal Increase Granulation Tissue to: 80% Increase Granulation Tissue - Progress: Progressing toward goal Goals/treatment plan/discharge plan were made with and agreed upon by patient/family: No, Patient unable to participate in goals/treatment/discharge plan and family unavailable Time For Goal Achievement: 7 days Wound Therapy - Potential for Goals: Fair  Goals will be updated until maximal potential achieved or discharge criteria met.  Discharge criteria: when goals achieved, discharge from hospital, MD  decision/surgical intervention, no progress towards goals, refusal/missing three consecutive treatments without notification or medical reason.  GP     Thelma Comp 10/28/2020, 10:03 AM   Rolinda Roan, PT, DPT Acute Rehabilitation Services Pager:  [phone] Office: [phone]

## 2020-10-28 NOTE — Consult Note (Addendum)
Ethics consult note   Recommendations 1. Please consider discussion/coordination with nephrology regarding a time-limited trial of CVVH-[?suggest 5 to 7-day time course] for patient to declare herself in terms of continued improvement--- this is a reasonable option to give patient a time-limited trial to recover from recent pneumonia and other myriad issues-- if continued improvement seems to occur may need to further discuss with nephrology next steps 2. Have placed chaplain consult for [facility] paperwork for  [name] ([phone]) to ensure 1 voice is heard from family and minimize misunderstandings 3. If option 1 above unsuccessful, suggest Implementation of Futility Policy, change CODE STATUS to DNR and de-escalate care--alternatively family has been informed of the right to find a separate facility as well as find a different physician who will be able to give the kind of care that they wish to achieve after above-mentioned timeframe by attending service has elapsed 4. We are available to follow peripherally and I will check in periodically with the care team over the next week-if a further team meeting is planned with the family I can be available physically to meet  Consult requested on 12/30 by attending service Dr. Ina Homes, PCCM  Chart reviewed, discussed with attending physician Dr. Roland Earl with nephrology consultant Dr. Carolin Sicks, discussed with psychiatrist Dr. Dwyane Dee and coordinated with RN Ms. Teodoro Kil  met in person with patient Jalacia Mattila, patient's daughter [name]. The patient's other children Marlowe Kays and Shanon Brow were on the phone for the duration of my separate meeting with [name]  Patient at the time of my arrival slightly sedated is able to follow commands to some degree she is able to mouth words but I am not sure if she completely understands She is currently on 0.6 of Precedex and is getting Dilaudid every 4 as needed pain-she does not appear to be on Levophed today  but was on this yesterday low-dose She is not able to phonate because of presence of tracheotomy Relatively tachycardic heart rates in the 118 range pressures are stable I am able to ask her to squeeze my fingers on both sides and she does so--- patient also is able to wiggle her toes faintly--my neuro exam was somewhat circumscribed because of her sedation--she is not able to really clearly follow  my finger with her gaze Her chest is relatively clear although she is tachycardic to my exam She has an ostomy in place Her heel protectors are off I do not discern any lesion  Daughter initially thought that I was there to file a grievance for at the unethical practices against the hospital.   I clarified with her my role of trying to find common ground between care team and patient proxy as patient's capacity cannot be elucidated from psych consult dated 10/27/20  Family relates patient was asked sometime in mid November regarding CODE STATUS and feasibility of continuation of full CODE STATUS and reiterated that this question came up again 12/30 and family's absence---    they do not wish to have Ms. Violett questioned about this in their absence  Family shared with me for over 30-minutes some of the events that transpired in the ICU from their perspective--they mention that they have had instances where they feel nothing was being done for mother when she was in extremis, short of breath when she was diagnosed with pneumonia on also express dissatisfaction with some members of the care team would previously seen the patient-the express dissatisfaction-about an instance where a clamp was left off when the patient  was getting CVVH I also received substantial input from sister Marlowe Kays and from brother Luster Landsberg seems to be an underlying mistrust of the care team and the overall hospital system and medical professionals  Subsequently we discussed where she currently is and I used drawings and analogies  of a car with the different parts including engine (heart) kidneys (exhaust) decubitus ulcer (rust)  to try to explain some of the multiple medical issues Ms. Slabaugh is facing.    They seem to understand clearly that she is ill-they believe is that she is improving-daughter mentions that on 12/29 she was surprised to see her mother's condition she waved and smiled and mild hello to her daughter-she wanted to see "Wee Wee" [William-her husband] Review of the chart also reveals that intermittently she is needing Precedex/Dilaudid every 4 for agitation and pain and has intermittently required CVVH per attending service.  Family at this juncture still wish that patient be full CODE STATUS still wish for aggressive medical therapies and still wish for recovery citing examples of a relative who had open heart surgery had a tracheotomy multiple complications stayed in the hospital for 5 months.  Their escalated behavior at times and outpouring of emotion was met with calm but clear redirection towards next steps with underlying goal to reconcile their wishes and likely end-stage trajectory-that Mrs.Carlis Abbott seems to be on  I presented a copy of the futility policy to [facility] and read it out in its entirety to both Shanon Brow and [place] who are listening on the line , stopping  at various points to explain things and I encouraged them to go through this in detail and printed out 3-4 copies for them Discussed clearly that subsequent to a time-limited continuation of CVVH, we would need to reconvene If consultant and attending services feel that the continuation of aggressive medical measures are futile, this may be grounds for unilateral decisions with regards to de-escalation of care  The alternative would be that family find a facility and a physician to take over management of Ms. Maris  Long discussion ensued about time-limited trial as per above-I ended the meeting cordially at 6:48 PM as family was clearly not  accepting time limitation to therapeutic trials of CVVH . At their request gave office contact for Dr. Hulen Skains CMO of hospital who they wanted to speak to   Time in: 4:45 PM Time out: 7:21 PM  Verneita Griffes, MD Ethics Committee

## 2020-10-28 NOTE — Progress Notes (Addendum)
[facility] NEPHROLOGY PROGRESS NOTE  Assessment/ Plan:  # AKI- oliguric presumably due to ischemic ATN in setting of hemorrhagic shock. Started on CRRT 09/08/20 due to persistent oliguria and hyperkalemia. All fluids 4K/2.5Ca: Pre-filter to 500 ml/hr, post-filter 200 ml/hr, and dialysate to 1000 ml/hr. Keeping even to 50 cc/hr. No anticoagulation due to thrombocytopenia and ongoing GI blood loss.  Changed to all 4K bath, monitor lab.  Pt has deteriorated several times when trying to transition to IHD.  Tried again on 12/24 which unfortunately failed again- only lasted 12 hours.... this is an issue-  Not sure when we will be able to transition her- cannot keep her on CRRT forever. RIJ TDC placed 10/19/20 by IR.   Blood pressure remains low and requiring Levophed.  She is not a candidate for outpatient dialysis and has very poor long-term prognosis.  CRRT is futile at this time.  Noticed meeting with the family yesterday and now involve ethics committee. I recommend comfort/hospice care.  Discussed with ICU team.  # Hemorrhagic shock- ongoing blood loss and requiring pressors. Transfuse as needed, have added ESA.  # Septic shock due to MRSA pneumonia.  Levophed IV.  # Perforated duodenal ulcer- s/p exploratory lap and Graham patch placement per surgery.  # Acute hypoxic/hypercapnic respiratory failure due to ARDS from Recent Covid-19 PNA- currently on vent via trach per PCCM. low o2.  # P. Atrial fibrillation- no anticoagulation due to GI bleed.  # Hyponatremia- worsened quickly off of CRRT-  manage with crrt.   # Severe protein malnutrition- per primary svc.  # Disposition- poor overall prognosis and will likely require LTAC and chronic vent support if we can ever get her transitioned to IHD.  This is unlikely at this time.   Subjective: Seen and examined in ICU.  Opens eyes with the name.  Blood pressure still soft, on Levophed.  Tolerating CRRT well.  No new  event. Objective Vital signs in last 24 hours: Vitals:   10/28/20 0700 10/28/20 0715 10/28/20 0745 10/28/20 0800  BP: (!) 97/56 92/60 96/64 98/64  Pulse: (!) 117 (!) 115 (!) 121 (!) 115  Resp: (!) 40 (!) 32 (!) 36 (!) 28  Temp:  98.7 F (37.1 C)    TempSrc:  Oral    SpO2: 99% 100% 100% 98%  Weight:      Height:       Weight change: 4.4 kg  Intake/Output Summary (Last 24 hours) at 10/28/2020 0828 Last data filed at 10/28/2020 0800 Gross per 24 hour  Intake 2063.48 ml  Output 924 ml  Net 1139.48 ml       Labs: Basic Metabolic Panel: Recent Labs  Lab 10/27/20 0452 10/27/20 1600 10/28/20 0341  NA 131* 133* 131*  K 3.8 3.3* 3.7  CL 98 100 98  CO2 24 25 23  GLUCOSE 210* 100* 194*  BUN 36* 25* 24*  CREATININE 0.59 0.54 0.61  CALCIUM 10.5* 10.3 10.3  PHOS 3.2 3.1 3.5   Liver Function Tests: Recent Labs  Lab 10/27/20 0452 10/27/20 1600 10/28/20 0341  ALBUMIN 2.1* 1.9* 2.0*   No results for input(s): LIPASE, AMYLASE in the last 168 hours. No results for input(s): AMMONIA in the last 168 hours. CBC: Recent Labs  Lab 10/24/20 0459 10/25/20 0506 10/26/20 0459 10/27/20 0452 10/28/20 0341  WBC 18.7* 16.4* 15.4* 23.1* 16.0*  HGB 7.0* 7.3* 7.4* 7.2* 7.2*  HCT 23.9* 24.7* 25.6* 23.7* 23.4*  MCV 96.4 95.7 96.6 94.4 94.0  PLT 286   285 293 280 273   Cardiac Enzymes: Recent Labs  Lab 10/22/20 1248  CKTOTAL 26*  CKMB 15.4*   CBG: Recent Labs  Lab 10/27/20 1604 10/27/20 1915 10/27/20 2313 10/28/20 0320 10/28/20 0744  GLUCAP 84 99 158* 209* 164*    Iron Studies: No results for input(s): IRON, TIBC, TRANSFERRIN, FERRITIN in the last 72 hours. Studies/Results: DG Chest Port 1 View  Result Date: 10/27/2020 CLINICAL DATA:  69-year-old female respiratory failure. Hypoxia. COVID-19. EXAM: PORTABLE CHEST 1 VIEW COMPARISON:  Portable chest 10/22/2020 and earlier. FINDINGS: Portable AP semi upright view at 0438 hours. Stable tracheostomy tube, dual lumen right  chest catheter, right PICC and visible enteric tube. Mildly lower lung volumes. Mediastinal contours are stable and within normal limits. Coarse and confluent bibasilar and right mid lung opacity is stable. Left upper lobe relatively spared. No pneumothorax. No definite effusion. Ventilation has not significantly changed since 10/20/2020. No acute osseous abnormality identified. IMPRESSION: 1. Stable lines and tubes. 2. Ventilation not significantly changed since 10/20/2020. Confluent bibasilar opacity. Electronically Signed   By: H  Hall M.D.   On: 10/27/2020 07:06    Medications: Infusions: .  prismasol BGK 4/2.5 500 mL/hr at 10/28/20 0205  .  prismasol BGK 4/2.5 200 mL/hr at 10/28/20 0650  . sodium chloride 10 mL/hr at 10/28/20 0800  . sodium chloride    .  ceFAZolin (ANCEF) IV Stopped (10/28/20 0629)  . dexmedetomidine (PRECEDEX) IV infusion 0.6 mcg/kg/hr (10/28/20 0800)  . feeding supplement (VITAL 1.5 CAL) 1,000 mL (10/27/20 0621)  . heparin 1,300 Units/hr (10/28/20 0800)  . norepinephrine (LEVOPHED) Adult infusion Stopped (10/28/20 0250)  . prismasol BGK 4/2.5 1,000 mL/hr at 10/28/20 0647    Scheduled Medications: . B-complex with vitamin C  1 tablet Per Tube Daily  . chlorhexidine gluconate (MEDLINE KIT)  15 mL Mouth Rinse BID  . Chlorhexidine Gluconate Cloth  6 each Topical Daily  . cholecalciferol  2,000 Units Per Tube Daily  . clonazepam  1 mg Per Tube BID  . collagenase   Topical Daily  . darbepoetin (ARANESP) injection - DIALYSIS  100 mcg Subcutaneous Q Tue-1800  . docusate  100 mg Per Tube BID  . feeding supplement (PROSource TF)  90 mL Per Tube TID  . guaiFENesin  15 mL Per Tube Q12H  .  HYDROmorphone (DILAUDID) injection  1 mg Intravenous Q4H  . insulin aspart  0-15 Units Subcutaneous Q4H  . insulin aspart  7 Units Subcutaneous Q4H  . insulin glargine  40 Units Subcutaneous Daily  . mouth rinse  15 mL Mouth Rinse QID  . midodrine  30 mg Per Tube TID WC  . nutrition  supplement (JUVEN)  1 packet Per Tube BID BM  . pantoprazole sodium  40 mg Per Tube BID  . polyethylene glycol  17 g Per Tube Daily  . QUEtiapine  50 mg Per Tube BID  . sodium chloride flush  10-40 mL Intracatheter Q12H  . sodium hypochlorite   Topical BID    have reviewed scheduled and prn medications.  Physical Exam: Not much change from yesterday. General: Critically ill looking female with a tracheostomy, opens eyes with the name Heart:RRR, s1s2 nl Lungs: Coarse breath sound bilateral Abdomen:soft,  non-distended Extremities:No edema Dialysis Access: Right IJ TDC   Prasad  10/28/2020,8:28 AM  LOS: 57 days    

## 2020-10-28 NOTE — Progress Notes (Signed)
Assisted tele visit to patient with daughter.  Aleja Yearwood R Fernanda Twaddell, RN   

## 2020-10-28 NOTE — Progress Notes (Signed)
NAMEKallee [name], MRN:  [id number], DOB:  1951-03-10, LOS: 4 ADMISSION DATE:  08/31/2020, CONSULTATION DATE:  09/07/2020 REFERRING MD:  Dr Candiss Norse, CHIEF COMPLAINT:  Acute resp failure  Brief History   69 year old female who was previously diagnosed with Covid 08/23/2020.  Admitted 11/2 with AF-RVR, found to have a perforated duodenal underwent exploratory laparotomy with Phillip Heal patch placement.  Past Medical History  Covid pneumonia Atrial fibrillation CKD stage III Diabetes mellitus Hypertension Colon cancer Hyperlipidemia  Significant Hospital Events   11/2 Admitted  11/3 OR with findings of perforated duodenal ulcer 11/10 progressive hemorrhagic shock, intubated, transfused, pressors, proned; started on CRRT in PM 11/03 Exploratory laparotomy, Phillip Heal patch, lysis of adhesion for duodenal ulceration postop day 6 11/16 Extubated. Re-intubated overnight due to respiratory distress and hypoxia with decreased mentation 11/18 Bronch, cultures sent 11/19 Hgb down getting blood 11/24 Spiked fever resume empirical antimicrobial therapy 11/26 Hemorrhagic shock, hgb 5.6, increased pressors, CT A/P  11/30 Per palliative "[name] expresses understanding that patient is unfortunately very fragile despite ongoing intensive medical treatment and full mechanical support. She indicates that the family wants to continue with all current interventions despite potential outcomes". 12/08 CRRT discontinued due to clotting 12/09 Family requested transfer to tertiary care [facility] denied transfer  12/10 CRRT restarted. Episodes of tachycardia, tachypnea that seem to improve with pain management 12/11 Back in shock. Pressor requirements up. CXR worse. ABX resumed 12/12 Still requiring inc pressors. Had hypoglycemic event. Basal insulin dosing adjusted 12/13 Pressor requirements better. Now hyperglycemic. Re-adjusted Glycemic control  12/14 Changed dilaudid to1/2 dosing from day further. D/c  vasopressin.  Goals of care reconfirmed with daughter.  Patient continues to desire aggressive care.  Not open to discussing any other option, patient family continues to be hopeful that she will be discharged to home with full recovery in spite of multiple attempts by staff to prepare them that this is unlikely scenario 12/15 Dilaudid discontinued sending cortisol for ongoing pressor dependence 12/16 Serum cortisol <20, added stress dose steroids.  PRN Dilaudid, attempting not go back on Dilaudid infusion 12/17 Developed worsening tachycardia during the evening hours received initially IV albumin, followed by resuming IV Dilaudid with question of suboptimal pain control.  Currently looks better back on Dilaudid drip.  We have been able to wean pressors after adding stress dose steroids; near arrest - bradycardia, better with atropine  12/19 Afebrile . Remains on dilaudid and heparin gtt, dilaudid gtt increased overnight for concern of pain / ongoing tachycardia, no other events . NE and precedex off 12/17. Ongoing CRRT- even UF, dosing lokelmia this morning 12/20 On CRRT.  Renal plans for HD cath and moving to HD. Getting wound care  12/23 -No vent weaning per RT, remains on full support, #8 trach in place but previously had #6, no documentation of change noted in chart Afebrile / WBC 20.4  Vent - 30% FiO2, PEEP 5 Glucose range 176-212 I/O 465 ml stool, 3.6L removed with HD, -1.1L in last 24 hours  RN reports ongoing periods of tachypnea / vent dyssynchrony that responds to dilaudid   12/24 - renal stopping CRRT today and plans HD 10/24/20 . 40% fio2 on vent via Trach. TAchypenic and tachycardic. Afebrile but wbc up to 27.6K. On TF. Onn heparin gtt  12/25 - Back on CRRT. On vent via trach at 40% fio2, On scheduled dilaudid as add on to oxy. Per family request 12/24 - they felt scheduled oxy was not adequate and patien was showing signs  of opioid withdrawal.  Patient also had  worsening SIRS/sepsis  syndrome. Had fever, rising wbc, worsening RR 40-60 and HR 140s sinsu-> strated On abx yesterday. Fever some better today. WBC plateau at 28,.5K. On new levophed -> signifanct improvement in HR 77 and RR t0 20. On heparin gtt.  On precedex gtt. On levophed gtt 47mcg wthi midodrine. On TF  12/30 Remains on CRRT with intermittent pressor requirements. Ethics consult placed evening of 12/29   Consults:  Cardiology [facility] Nephrology   Procedures:  R PICC 11/5 >> A line 11/9 >> out ETT 11/9 > 11/16, 11/16 >> 09/21/2020, 09/21/2020 tracheostomy>> Lt Schleicher CVL 11/9 >> R IJ trialysis >> out HD catheter 12/1 >>12/20 12/21 - 14.5 Fr, 23 cm right IJ tunneled hemodialysis catheter placement.  Removal of indwelling subclavian catheter.   Significant Diagnostic Tests:  11/3 CT abd/ pelvis > 1. Positive for bowel perforation: Pneumoperitoneum and intermediate density free fluid in the abdomen. Prior total colectomy. The specific site of perforation is unclear-oral contrast present to the proximal jejunum has not obviously leaked. Note that there may be small bowel loops adherent to the ventral abdominal wall along the greater curve of the stomach. 2. Extensive bilateral lower lung pneumonia. No pleural effusion. 3. Other abdominal and pelvic viscera are stable since 2015, including bilateral adrenal adenomas. Chronic renal parapelvic cysts. 4. Aortic Atherosclerosis 11/3 TTE > EF 70-75%, RV not well visualized, mildly reduced RV systolic function 54/56 CT chest/ abd/ pelvis> 1. Interval progression of diffuse bilateral hazy ground-glass airspace opacities with more focal areas of consolidation at the lung bases 2. Trace bilateral pleural effusions. 3. Postsurgical changes the abdomen as detailed above. No evidence for a postoperative abscess, however evaluation is limited by lack of IV contrast. 4. There is a 1.9 cm cystic appearing lesion located in the pancreatic body. This was not present on the  patient's CT from 2015.  Follow-up with an outpatient contrast enhanced MRI is recommended. 5. The endometrial stripe appears diffusely thickened. Follow-up with pelvic ultrasound is recommended. Aortic Atherosclerosis 11/14 LE doppler studies > + DVT of right posterior tibial and peroneal vein, +dVT of left posterior tibial vein  11/26 CT ABD PEL > liver unremarkable, distended gallbladder with layering tiny gallstones versus sludge, no duct dilatation, mild hyperdensity right upper pole renal collecting system new.  No evidence of retroperitoneal bleeding.  Multifocal lower lobe predominant pulmonary infiltrates/pneumonia.  Small left pleural effusion.  Micro Data:  11/10 MRSA PCR > neg 11/10 BC x 2 > neg 09/22/2020 blood cultures x2>> S epi.  09/22/2020 sputum culture>> MRSA Blood 12/1 >> negative. S.epi 1 out of 2, likely contaminant BCx 2 12/12 >> neg xxx Trach aspirate 12/24 - FEW STAPHYLOCOCCUS AUREUS  Blood 12/24 > Negative   Antimicrobials:  azithro 11/2 >11/3 Ceftriaxone 11/2  Fluconazole 11/3 Zosyn 11/3 >> 11/7 Vanc 11/10 off Cefepime 11/10 > 11/16 09/22/2020 vancomycin for MRSA PNA >> 12/2 xxxx Vanc 12/11> 12/17 Zosyn 12/11> 12/17 xxxx vanc 12/24 (staph resp cutlure) >> 12/28 Zosyn 12/24 - 12/27 Cefazolin 12/27 >>   Subjective:  On and off pressors   Objective   Blood pressure 93/69, pulse (!) 109, temperature 98.7 F (37.1 C), temperature source Oral, resp. rate (!) 32, height 5\' 8"  (1.727 m), weight 78.4 kg, SpO2 100 %.    Vent Mode: PRVC FiO2 (%):  [40 %] 40 % Set Rate:  [28 bmp] 28 bmp Vt Set:  [450 mL] 450 mL PEEP:  [5 cmH20] 5 cmH20  Plateau Pressure:  [11 cmH20-28 cmH20] 28 cmH20   Intake/Output Summary (Last 24 hours) at 10/28/2020 0931 Last data filed at 10/28/2020 0900 Gross per 24 hour  Intake 2173.29 ml  Output 1117 ml  Net 1056.29 ml   Filed Weights   10/26/20 0500 10/27/20 0225 10/28/20 0338  Weight: 74 kg 74 kg 78.4 kg   Physical  Exam  General: Chronically ill appearing very deconditioned elderly female on mechanical ventilation through trach, in NAD HEENT: 22mm cuffed shiley trach, MM pink/moist, PERRL,  Neuro: Will open eyes to verbal stimuli, she will mouth her name and follow simple commands. Very weak CV: s1s2 regular rate and rhythm, no murmur, rubs, or gallops,  PULM:  Bilateral rhonchi, remains vent dependent with mild tachypnea  GI: soft, bowel sounds active in all 4 quadrants, non-tender, non-distended, tolerating TF Extremities: warm/dry, no edema  Skin: no rashes or lesions  Resolved problems:  Previously hemorrhagic shock requiring PRBCs, Septic shock, MRSA pneumonia s/p 8 days abx Complicated by MRSA healthcare associated pneumonia (treated) Peritonitis and perforated ulcer post repair 09/01/20  Assessment:   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, status post tracheostomy  New MSSA HCAP vs bronchitis 12/24 with multiple rounds of ABT for multiple VAP's  Persistent vasoplegia for 51 days  Renal failure on CRRT for 50 days  Afib/DVT on heparin drip  Stage IV sacral ulcer  History of stage IV colon cancer 1.9 cm cystic appearing lesion located in the pancreatic body  Plan:  Continue Ancef for MSSA PNA Continue heparin drip  Continue hydrotherapy for unstageable sacral ulcer  Continue full vent support  Continue pressors and CRRT Continue to wean sedating meds as able  Proceed with Ethics consult   Best practice:  Diet: TF Pain/Anxiety/Delirium protocol (if indicated) dilaudid IV pushes/ enteral sedation-  clonazepam, oxy, seroquel VAP protocol (if indicated): yes DVT prophylaxis: IV heparin GI prophylaxis: Protonix Glucose control: SSI, TF coverage, lantus Mobility: Bedrest  Johnsie Cancel, NP-C [facility] Contact / Pager information can be found on Amion  10/28/2020, 9:42 AM

## 2020-10-28 NOTE — TOC Progression Note (Signed)
Transition of Care [facility]) - Progression Note    Patient Details  Name: [name] MRN: [id number] Date of Birth: 03-17-1951  [facility] CM/SW [place], RN Phone Number: 10/28/2020, 5:09 PM  Clinical Narrative:    Patient on day 20 COVID no better, on CRRT starting to see futility of care. Family continues to want to press forth with the goal for home. They have been cautioned that this is not likely, however are stating that they will continue to strive forward. Ethic committee is consulted to provide their input as continuing treatments will not likely result in a better outcome.  CM will follow for needs.   Expected Discharge Plan: Long Term Acute Care (LTAC) Barriers to Discharge: Continued Medical Work up  Expected Discharge Plan and Services Expected Discharge Plan: Long Term Acute Care (LTAC)   Discharge Planning Services: CM Consult Post Acute Care Choice: Long Term Acute Care (LTAC) Living arrangements for the past 2 months: Single Family Home                 DME Arranged: N/A DME Agency: NA       HH Arranged: NA HH Agency: NA         Social Determinants of Health (SDOH) Interventions    Readmission Risk Interventions No flowsheet data found.

## 2020-10-28 NOTE — Progress Notes (Signed)
ANTICOAGULATION CONSULT NOTE  Pharmacy Consult:  Heparin Indication: atrial fibrillation   Labs: Recent Labs    10/26/20 0459 10/26/20 1718 10/27/20 0452 10/27/20 1600 10/28/20 0341  HGB 7.4*  --  7.2*  --  7.2*  HCT 25.6*  --  23.7*  --  23.4*  PLT 293  --  280  --  273  HEPARINUNFRC 0.49  --  0.48  --  0.42  CREATININE 0.65   < > 0.59 0.54 0.61   < > = values in this interval not displayed.    Assessment: 69 yo female with history of Afib on apixaban prior to admission, which has been held and patient transitioned to IV heparin.  Heparin level is therapeutic; no further bleeding per RN.  Hemoglobin remains low normal, platelet count WNL.  Goal of Therapy:  Heparin level 0.3-0.5 units/ml   Plan:  Continue heparin gtt at 1300 units/hr Daily heparin level and CBC Monitor for bleeding  Barth Kirks, PharmD, BCPS, BCCCP Clinical Pharmacist [phone]  Please check AMION for all [place] numbers  10/28/2020 8:30 AM

## 2020-10-28 NOTE — Progress Notes (Signed)
Physical Therapy Treatment Patient Details Name: Candace [name] MRN: [id number] DOB: 02-16-1951 Today's Date: 10/28/2020    History of Present Illness 69 y.o. female with medical history significant for recent covid pna, obesity, colon cancer s/p colon resection with colostomy bag, HLD, NIDDM2, CKD3 presented to ED after her follow-up nurse advised her to present to ED for elevated HR. +afib, elevated troponins with demand ischemia,  CT scan is positive for bowel perforation with pneumomediastinum 11/03 exp lap with repair of perforated ulcer. 11/10 t/f to ICU- intubated/sedated/proned. Trach placed 11/24. CRRT off 12/8, restarted 12/10.    PT Comments    Pt with focused attention throughout session with ability to open and close bil hands weakly and wiggle toes on left foot,did not demonstrate right toe wiggle this session. Trace activation noted today: right wrist, left ankle, right tricep and left tricep/bicep. Pt following commands for eye movement and sticking out tongue 75% of the time. Pt remains with atrophy throughout but new trace movement as noted above. Will continue to attempt increased engagement and movement. Positioned neck in midline and encouraged AAROm for neck rotation.   HR 122 SpO2 100%, Fio2 40%   Follow Up Recommendations  LTACH;Supervision/Assistance - 24 hour     Equipment Recommendations  Wheelchair (measurements PT);Wheelchair cushion (measurements PT);Hospital bed;Other (comment)    Recommendations for Other Services       Precautions / Restrictions Precautions Precautions: Fall Precaution Comments: baseline colostomy; trach-vent; CRRT; A line, sacral wound Restrictions Weight Bearing Restrictions: No    Mobility  Bed Mobility Overal bed mobility: Needs Assistance Bed Mobility: Supine to Sit     Supine to sit: Total assist     General bed mobility comments: Transitioned to chair position using bed, total assist. Total assist to rotate neck to  midline and toward left due to preference for right rotation. Pt without noted core engagement in sitting but not falling to one side today, probably due to positioning of hips acheived.  Transfers                 General transfer comment: unable  Ambulation/Gait                 Stairs             Wheelchair Mobility    Modified Rankin (Stroke Patients Only)       Balance Overall balance assessment: Needs assistance   Sitting balance-Leahy Scale: Zero                                      Cognition Arousal/Alertness: Awake/alert Behavior During Therapy: Flat affect Overall Cognitive Status: Difficult to assess Area of Impairment: Following commands;Attention                   Current Attention Level: Focused   Following Commands: Follows one step commands inconsistently;Follows one step commands with increased time       General Comments: Eyes open 90% of the time. pt following commands 75% of the time for squeezing bil hands, toe wiggles on left foot only and opening/closing eyes and sticking out tongue      Exercises General Exercises - Upper Extremity Shoulder Flexion: PROM;Both;10 reps;Seated Elbow Flexion: Both;PROM;10 reps;Seated Elbow Extension: Both;PROM;10 reps;Seated Digit Composite Flexion: Both;10 reps;AAROM;Seated Composite Extension: AAROM;PROM;Both;10 reps;Seated General Exercises - Lower Extremity Ankle Circles/Pumps: PROM;Both;10 reps;Seated Long Arc Quad: PROM;Both;Seated;10 reps Hip ABduction/ADduction: PROM;10 reps;Both;Seated  Hip Flexion/Marching: PROM;Both;Seated;10 reps    General Comments        Pertinent Vitals/Pain Pain Assessment: Faces Pain Score: 5  Pain Location: grimace with right elbow flexion Pain Descriptors / Indicators: Grimacing Pain Intervention(s): Limited activity within patient's tolerance;Monitored during session;Repositioned    Home Living                       Prior Function            PT Goals (current goals can now be found in the care plan section) Progress towards PT goals: Progressing toward goals    Frequency    Min 2X/week      PT Plan Current plan remains appropriate    Co-evaluation              AM-PAC PT "6 Clicks" Mobility   Outcome Measure  Help needed turning from your back to your side while in a flat bed without using bedrails?: Total Help needed moving from lying on your back to sitting on the side of a flat bed without using bedrails?: Total Help needed moving to and from a bed to a chair (including a wheelchair)?: Total Help needed standing up from a chair using your arms (e.g., wheelchair or bedside chair)?: Total Help needed to walk in hospital room?: Total Help needed climbing 3-5 steps with a railing? : Total 6 Click Score: 6    End of Session   Activity Tolerance: Patient tolerated treatment well Patient left: in bed;with call bell/phone within reach;with nursing/sitter in room Nurse Communication: Mobility status;Need for lift equipment PT Visit Diagnosis: Muscle weakness (generalized) (M62.81);Difficulty in walking, not elsewhere classified (R26.2);Pain;Adult, failure to thrive (R62.7);Unsteadiness on feet (R26.81)     Time: 3888-7579 PT Time Calculation (min) (ACUTE ONLY): 27 min  Charges:  $Therapeutic Exercise: 23-37 mins                     Darik Massing P, PT Acute Rehabilitation Services Pager: [phone] Office: [phone]    Sandy Salaam Serine Kea 10/28/2020, 9:46 AM

## 2020-10-29 DIAGNOSIS — R0902 Hypoxemia: Secondary | ICD-10-CM | POA: Diagnosis not present

## 2020-10-29 LAB — CBC
HCT: 24.4 % — ABNORMAL LOW (ref 36.0–46.0)
Hemoglobin: 7.2 g/dL — ABNORMAL LOW (ref 12.0–15.0)
MCH: 28.5 pg (ref 26.0–34.0)
MCHC: 29.5 g/dL — ABNORMAL LOW (ref 30.0–36.0)
MCV: 96.4 fL (ref 80.0–100.0)
Platelets: 237 10*3/uL (ref 150–400)
RBC: 2.53 MIL/uL — ABNORMAL LOW (ref 3.87–5.11)
RDW: 16.1 % — ABNORMAL HIGH (ref 11.5–15.5)
WBC: 16 10*3/uL — ABNORMAL HIGH (ref 4.0–10.5)
nRBC: 3.8 % — ABNORMAL HIGH (ref 0.0–0.2)

## 2020-10-29 LAB — RENAL FUNCTION PANEL
Albumin: 2.1 g/dL — ABNORMAL LOW (ref 3.5–5.0)
Albumin: 2.1 g/dL — ABNORMAL LOW (ref 3.5–5.0)
Anion gap: 9 (ref 5–15)
Anion gap: 9 (ref 5–15)
BUN: 35 mg/dL — ABNORMAL HIGH (ref 8–23)
BUN: 41 mg/dL — ABNORMAL HIGH (ref 8–23)
CO2: 25 mmol/L (ref 22–32)
CO2: 23 mmol/L (ref 22–32)
Calcium: 10.2 mg/dL (ref 8.9–10.3)
Calcium: 10.1 mg/dL (ref 8.9–10.3)
Chloride: 99 mmol/L (ref 98–111)
Chloride: 98 mmol/L (ref 98–111)
Creatinine, Ser: 0.63 mg/dL (ref 0.44–1.00)
Creatinine, Ser: 0.58 mg/dL (ref 0.44–1.00)
GFR, Estimated: >60 mL/min (ref >60)
GFR, Estimated: >60 mL/min (ref >60)
Glucose, Bld: 187 mg/dL — ABNORMAL HIGH (ref 70–99)
Glucose, Bld: 198 mg/dL — ABNORMAL HIGH (ref 70–99)
Phosphorus: 3.1 mg/dL (ref 2.5–4.6)
Phosphorus: 2.5 mg/dL (ref 2.5–4.6)
Potassium: 4.4 mmol/L (ref 3.5–5.1)
Potassium: 4.6 mmol/L (ref 3.5–5.1)
Sodium: 133 mmol/L — ABNORMAL LOW (ref 135–145)
Sodium: 130 mmol/L — ABNORMAL LOW (ref 135–145)

## 2020-10-29 LAB — GLUCOSE, CAPILLARY
Glucose-Capillary: 171 mg/dL — ABNORMAL HIGH (ref 70–99)
Glucose-Capillary: 184 mg/dL — ABNORMAL HIGH (ref 70–99)
Glucose-Capillary: 213 mg/dL — ABNORMAL HIGH (ref 70–99)
Glucose-Capillary: 201 mg/dL — ABNORMAL HIGH (ref 70–99)
Glucose-Capillary: 223 mg/dL — ABNORMAL HIGH (ref 70–99)
Glucose-Capillary: 214 mg/dL — ABNORMAL HIGH (ref 70–99)

## 2020-10-29 LAB — MAGNESIUM: Magnesium: 2.7 mg/dL — ABNORMAL HIGH (ref 1.7–2.4)

## 2020-10-29 LAB — HEPARIN LEVEL (UNFRACTIONATED): Heparin Unfractionated: 0.37 IU/mL (ref 0.30–0.70)

## 2020-10-29 NOTE — Progress Notes (Signed)
ANTICOAGULATION CONSULT NOTE  Pharmacy Consult:  Heparin Indication: atrial fibrillation   Labs: Recent Labs    10/27/20 0452 10/27/20 1600 10/28/20 0341 10/28/20 1636 10/29/20 0455  HGB 7.2*  --  7.2*  --  7.2*  HCT 23.7*  --  23.4*  --  24.4*  PLT 280  --  273  --  237  HEPARINUNFRC 0.48  --  0.42  --  0.37  CREATININE 0.59   < > 0.61 0.54 0.63   < > = values in this interval not displayed.    Assessment: 69 yo female with history of Afib on apixaban prior to admission, which has been held and patient transitioned to IV heparin.   Today the heparin level remains therapeutic at 0.37 on current rate of 1300 units/hr (13 mL/hr). There has been no further bleeding per RN and her hemoglobin remains low-stable with normal platelet count.  Goal of Therapy:  Heparin level 0.3-0.5 units/ml   Plan:  Continue heparin gtt at 1300 units/hr Daily heparin level and CBC Monitor for bleeding  Sloan Leiter, PharmD, BCPS, BCCCP Clinical Pharmacist Please refer to [facility] for [place] numbers 10/29/2020 10:26 AM

## 2020-10-29 NOTE — Progress Notes (Signed)
   10/29/20 1105  Clinical Encounter Type  Visited With Health care provider  Visit Type Initial  Referral From Physician  Consult/Referral To Chaplain   Chaplain responded to Consult request regarding HCPOA. After reading Pt's chart, Chaplain spoke with Pt's nurse, Shea Stakes, first. Because Pt's mental capacity is unable to be determined by medical staff, AD document is unable to be completed at this time. Shea Stakes was understanding and stated he would convey that to other medical staff as needed.    This note was prepared by Chaplain Resident, Dante Gang, MDiv. For questions, please contact by phone at [phone].

## 2020-10-29 NOTE — Progress Notes (Signed)
[facility] NEPHROLOGY PROGRESS NOTE  Assessment/ Plan:  # AKI- oliguric now likely anuric. ATN for shock.Started on CRRT 09/08/20 due to persistent oliguria/anuria and hyperkalemia.Maalaea placed on 10/19/20. -All fluids 4K/2.5Ca: Pre-filter to 500 ml/hr, post-filter 200 ml/hr, and dialysate to 1000 ml/hr. Keeping even to 50 cc/hr. No anticoagulation due to thrombocytopenia and bleeding -Patient has failed multiple transitions to HD w/ low Bps requiring pressors -No signs of meaningful recovery from our team -Family expressed ongoing wishes for aggressive care -Ethic seen 12/30 and suggested time trial of 5-7 days CV while finishing treatment for PNA -Abx starting 27th so will plan for no restart on Sunday and HD on Monday -If she fails HD then would implement futility policy as outline by ethics and not restart HD  -I called daughter to outline plans today but she did not answer. Will try again over the weekend.  # Hemorrhagic shock- ongoing blood loss and requiring pressors intermittently. Transfuse as needed, have added ESA.  # Septic shock due to MRSA pneumonia.  Levophed IV as needed  # Perforated duodenal ulcer- s/p exploratory lap and Graham patch placement per surgery.  # Acute hypoxic/hypercapnic respiratory failure due to ARDS from Recent Covid-19 PNA- currently on vent via trach per PCCM. low o2.  # P. Atrial fibrillation- no anticoagulation due to GI bleed.  # Hyponatremia- worsened quickly off of CRRT-  manage with crrt.   # Severe protein malnutrition- per primary svc.  # Disposition- poor overall prognosis and will likely require LTAC and chronic vent support if we can ever get her transitioned to IHD.  This is unlikely at this time.   Subjective: Seen and examined in ICU. Minimal interaction, stable on CV with no changes.  Ethics saw patient yesterday suggesting time trial of CV.  Objective Vital signs in last 24 hours: Vitals:   10/29/20 0900  10/29/20 0915 10/29/20 0930 10/29/20 0945  BP: 111/70 111/70 115/72 98/77  Pulse: (!) 104 (!) 103 (!) 104 (!) 127  Resp: (!) 28 (!) 28 (!) 28 (!) 39  Temp:      TempSrc:      SpO2: 99% 99% 100% 98%  Weight:      Height:       Weight change: -0.5 kg  Intake/Output Summary (Last 24 hours) at 10/29/2020 1001 Last data filed at 10/29/2020 0926 Gross per 24 hour  Intake 3346.84 ml  Output 4210 ml  Net -863.16 ml       Labs: Basic Metabolic Panel: Recent Labs  Lab 10/28/20 0341 10/28/20 1636 10/29/20 0455  NA 131* 135 133*  K 3.7 4.2 4.4  CL 98 101 99  CO2 23 24 25   GLUCOSE 194* 122* 187*  BUN 24* 27* 35*  CREATININE 0.61 0.54 0.63  CALCIUM 10.3 10.5* 10.2  PHOS 3.5 3.0 3.1   Liver Function Tests: Recent Labs  Lab 10/28/20 0341 10/28/20 1636 10/29/20 0455  ALBUMIN 2.0* 2.1* 2.1*   No results for input(s): LIPASE, AMYLASE in the last 168 hours. No results for input(s): AMMONIA in the last 168 hours. CBC: Recent Labs  Lab 10/25/20 0506 10/26/20 0459 10/27/20 0452 10/28/20 0341 10/29/20 0455  WBC 16.4* 15.4* 23.1* 16.0* 16.0*  HGB 7.3* 7.4* 7.2* 7.2* 7.2*  HCT 24.7* 25.6* 23.7* 23.4* 24.4*  MCV 95.7 96.6 94.4 94.0 96.4  PLT 285 293 280 273 237   Cardiac Enzymes: Recent Labs  Lab 10/22/20 1248  CKTOTAL 26*  CKMB 15.4*   CBG: Recent Labs  Lab 10/28/20 1606 10/28/20 1914 10/28/20 2314 10/29/20 0314 10/29/20 0735  GLUCAP 143* 213* 155* 171* 184*    Iron Studies: No results for input(s): IRON, TIBC, TRANSFERRIN, FERRITIN in the last 72 hours. Studies/Results: No results found.  Medications: Infusions: .  prismasol BGK 4/2.5 500 mL/hr at 10/29/20 0844  .  prismasol BGK 4/2.5 200 mL/hr at 10/29/20 0837  . sodium chloride 10 mL/hr at 10/29/20 0900  . sodium chloride    .  ceFAZolin (ANCEF) IV Stopped (10/29/20 0546)  . dexmedetomidine (PRECEDEX) IV infusion 0.4 mcg/kg/hr (10/29/20 0900)  . feeding supplement (VITAL 1.5 CAL) 1,000 mL  (10/29/20 0315)  . heparin 1,300 Units/hr (10/29/20 0900)  . norepinephrine (LEVOPHED) Adult infusion Stopped (10/28/20 0911)  . prismasol BGK 4/2.5 1,000 mL/hr at 10/29/20 0831    Scheduled Medications: . B-complex with vitamin C  1 tablet Per Tube Daily  . chlorhexidine gluconate (MEDLINE KIT)  15 mL Mouth Rinse BID  . Chlorhexidine Gluconate Cloth  6 each Topical Daily  . cholecalciferol  2,000 Units Per Tube Daily  . clonazepam  1 mg Per Tube BID  . [START ON 10/31/2020] collagenase   Topical Daily  . darbepoetin (ARANESP) injection - DIALYSIS  100 mcg Subcutaneous Q Tue-1800  . docusate  100 mg Per Tube BID  . feeding supplement (PROSource TF)  90 mL Per Tube TID  . guaiFENesin  15 mL Per Tube Q12H  .  HYDROmorphone (DILAUDID) injection  1 mg Intravenous Q4H  . influenza vaccine adjuvanted  0.5 mL Intramuscular Tomorrow-1000  . insulin aspart  0-15 Units Subcutaneous Q4H  . insulin aspart  7 Units Subcutaneous Q4H  . insulin glargine  40 Units Subcutaneous Daily  . mouth rinse  15 mL Mouth Rinse 10 times per day  . midodrine  30 mg Per Tube TID WC  . nutrition supplement (JUVEN)  1 packet Per Tube BID BM  . pantoprazole sodium  40 mg Per Tube BID  . polyethylene glycol  17 g Per Tube Daily  . QUEtiapine  50 mg Per Tube BID  . sodium chloride flush  10-40 mL Intracatheter Q12H  . sodium hypochlorite   Topical BID    have reviewed scheduled and prn medications.  Physical Exam:  General: Critically ill appearing, eyes open, no interaction Heart:tachycardia, no audible murmur Lungs: Coarse breath sound bilateral, bilateral chest rise Abdomen:soft,  non-distended Extremities:trace edema in BLE Dialysis Access: Right IJ TDC  Enzo Treu J Erroll Wilbourne 10/29/2020,10:01 AM  LOS: 58 days

## 2020-10-29 NOTE — Progress Notes (Signed)
NAMEJenise [name], MRN:  [id number], DOB:  Oct 03, 1951, LOS: 36 ADMISSION DATE:  08/31/2020, CONSULTATION DATE:  09/07/2020 REFERRING MD:  Dr Candiss Norse, CHIEF COMPLAINT:  Acute resp failure  Brief History   69 year old female who was previously diagnosed with Covid 08/23/2020.  Admitted 11/2 with AF-RVR, found to have a perforated duodenal underwent exploratory laparotomy with Phillip Heal patch placement.  Past Medical History  Covid pneumonia Atrial fibrillation CKD stage III Diabetes mellitus Hypertension Colon cancer Hyperlipidemia  Significant Hospital Events   11/2 Admitted  11/3 OR with findings of perforated duodenal ulcer 11/10 progressive hemorrhagic shock, intubated, transfused, pressors, proned; started on CRRT in PM 11/03 Exploratory laparotomy, Phillip Heal patch, lysis of adhesion for duodenal ulceration postop day 6 11/16 Extubated. Re-intubated overnight due to respiratory distress and hypoxia with decreased mentation 11/18 Bronch, cultures sent 11/19 Hgb down getting blood 11/24 Spiked fever resume empirical antimicrobial therapy 11/26 Hemorrhagic shock, hgb 5.6, increased pressors, CT A/P  11/30 Per palliative "[name] expresses understanding that patient is unfortunately very fragile despite ongoing intensive medical treatment and full mechanical support. She indicates that the family wants to continue with all current interventions despite potential outcomes". 12/08 CRRT discontinued due to clotting 12/09 Family requested transfer to tertiary care [facility]). [facility] denied transfer  12/10 CRRT restarted. Episodes of tachycardia, tachypnea that seem to improve with pain management 12/11 Back in shock. Pressor requirements up. CXR worse. ABX resumed 12/12 Still requiring inc pressors. Had hypoglycemic event. Basal insulin dosing adjusted 12/13 Pressor requirements better. Now hyperglycemic. Re-adjusted Glycemic control  12/14 Changed dilaudid to1/2 dosing from day further. D/c  vasopressin.  Goals of care reconfirmed with daughter.  Patient continues to desire aggressive care.  Not open to discussing any other option, patient family continues to be hopeful that she will be discharged to home with full recovery in spite of multiple attempts by staff to prepare them that this is unlikely scenario 12/15 Dilaudid discontinued sending cortisol for ongoing pressor dependence 12/16 Serum cortisol <20, added stress dose steroids.  PRN Dilaudid, attempting not go back on Dilaudid infusion 12/17 Developed worsening tachycardia during the evening hours received initially IV albumin, followed by resuming IV Dilaudid with question of suboptimal pain control.  Currently looks better back on Dilaudid drip.  We have been able to wean pressors after adding stress dose steroids; near arrest - bradycardia, better with atropine  12/19 Afebrile . Remains on dilaudid and heparin gtt, dilaudid gtt increased overnight for concern of pain / ongoing tachycardia, no other events . NE and precedex off 12/17. Ongoing CRRT- even UF, dosing lokelmia this morning 12/20 On CRRT.  Renal plans for HD cath and moving to HD. Getting wound care  12/23 -No vent weaning per RT, remains on full support, #8 trach in place but previously had #6, no documentation of change noted in chart Afebrile / WBC 20.4  Vent - 30% FiO2, PEEP 5 Glucose range 176-212 I/O 465 ml stool, 3.6L removed with HD, -1.1L in last 24 hours  RN reports ongoing periods of tachypnea / vent dyssynchrony that responds to dilaudid   12/24 - renal stopping CRRT today and plans HD 10/24/20 . 40% fio2 on vent via Trach. TAchypenic and tachycardic. Afebrile but wbc up to 27.6K. On TF. Onn heparin gtt  12/25 - Back on CRRT. On vent via trach at 40% fio2, On scheduled dilaudid as add on to oxy. Per family request 12/24 - they felt scheduled oxy was not adequate and patien was showing signs  of opioid withdrawal.  Patient also had  worsening SIRS/sepsis  syndrome. Had fever, rising wbc, worsening RR 40-60 and HR 140s sinsu-> strated On abx yesterday. Fever some better today. WBC plateau at 28,.5K. On new levophed -> signifanct improvement in HR 77 and RR t0 20. On heparin gtt.  On precedex gtt. On levophed gtt 51mcg wthi midodrine. On TF  12/30 Remains on CRRT with intermittent pressor requirements. Ethics consult placed evening of 12/29. Ethics rec time trial of CRRT 12/31 failed SBT with RR 40s   Consults:  Cardiology [facility] Nephrology  Ethics  Procedures:  R PICC 11/5 >> A line 11/9 >> out ETT 11/9 > 11/16, 11/16 >> 09/21/2020, 09/21/2020 tracheostomy>> Lt [place] CVL 11/9 >> R IJ trialysis >> out HD catheter 12/1 >>12/20 12/21 - 14.5 Fr, 23 cm right IJ tunneled hemodialysis catheter placement.  Removal of indwelling subclavian catheter.   Significant Diagnostic Tests:  11/3 CT abd/ pelvis > 1. Positive for bowel perforation: Pneumoperitoneum and intermediate density free fluid in the abdomen. Prior total colectomy. The specific site of perforation is unclear-oral contrast present to the proximal jejunum has not obviously leaked. Note that there may be small bowel loops adherent to the ventral abdominal wall along the greater curve of the stomach. 2. Extensive bilateral lower lung pneumonia. No pleural effusion. 3. Other abdominal and pelvic viscera are stable since 2015, including bilateral adrenal adenomas. Chronic renal parapelvic cysts. 4. Aortic Atherosclerosis 11/3 TTE > EF 70-75%, RV not well visualized, mildly reduced RV systolic function 21/19 CT chest/ abd/ pelvis> 1. Interval progression of diffuse bilateral hazy ground-glass airspace opacities with more focal areas of consolidation at the lung bases 2. Trace bilateral pleural effusions. 3. Postsurgical changes the abdomen as detailed above. No evidence for a postoperative abscess, however evaluation is limited by lack of IV contrast. 4. There is a 1.9 cm cystic appearing lesion  located in the pancreatic body. This was not present on the patient's CT from 2015.  Follow-up with an outpatient contrast enhanced MRI is recommended. 5. The endometrial stripe appears diffusely thickened. Follow-up with pelvic ultrasound is recommended. Aortic Atherosclerosis 11/14 LE doppler studies > + DVT of right posterior tibial and peroneal vein, +dVT of left posterior tibial vein  11/26 CT ABD PEL > liver unremarkable, distended gallbladder with layering tiny gallstones versus sludge, no duct dilatation, mild hyperdensity right upper pole renal collecting system new.  No evidence of retroperitoneal bleeding.  Multifocal lower lobe predominant pulmonary infiltrates/pneumonia.  Small left pleural effusion.  Micro Data:  11/10 MRSA PCR > neg 11/10 BC x 2 > neg 09/22/2020 blood cultures x2>> S epi.  09/22/2020 sputum culture>> MRSA Blood 12/1 >> negative. S.epi 1 out of 2, likely contaminant BCx 2 12/12 >> neg xxx Trach aspirate 12/24 - FEW STAPHYLOCOCCUS AUREUS  Blood 12/24 > Negative   Antimicrobials:  azithro 11/2 >11/3 Ceftriaxone 11/2  Fluconazole 11/3 Zosyn 11/3 >> 11/7 Vanc 11/10 off Cefepime 11/10 > 11/16 09/22/2020 vancomycin for MRSA PNA >> 12/2 xxxx Vanc 12/11> 12/17 Zosyn 12/11> 12/17 xxxx vanc 12/24 (staph resp cutlure) >> 12/28 Zosyn 12/24 - 12/27 Cefazolin 12/27 >>   Subjective:   Ethics meeting 12/30 late afternoon  BMP stable  - very minor hyponatremia Na 133 WBC 16 Hgb 7.2   Weaning precedex this morning SBT attempted -- failed after a few minutes due to incr RR   Patient mouthing " I want to stop" and "Get my daughter"  Objective   Blood pressure 99/63,  pulse (!) 109, temperature 98.5 F (36.9 C), temperature source Oral, resp. rate (!) 30, height 5\' 8"  (1.727 m), weight 77.9 kg, SpO2 100 %.    Vent Mode: PRVC FiO2 (%):  [40 %] 40 % Set Rate:  [28 bmp] 28 bmp Vt Set:  [450 mL] 450 mL PEEP:  [5 cmH20] 5 cmH20 Plateau Pressure:  [20 cmH20-30  cmH20] 24 cmH20   Intake/Output Summary (Last 24 hours) at 10/29/2020 0815 Last data filed at 10/29/2020 8588 Gross per 24 hour  Intake 3052.65 ml  Output 4269 ml  Net -1216.35 ml   Filed Weights   10/27/20 0225 10/28/20 0338 10/29/20 0500  Weight: 74 kg 78.4 kg 77.9 kg   Physical Exam  General: Chronically/critically ill older adult F, trach/vent/CRRT awake and reclined in bed NAD  HEENT: 28mm cuffed shiley trach, MM pink/moist, PERRL,  Neuro: Will open eyes to verbal stimuli, she will mouth her name and follow simple commands. Very weak CV: s1s2 regular rate and rhythm, no murmur, rubs, or gallops,  PULM:  Bilateral rhonchi, remains vent dependent with mild tachypnea  GI: soft, bowel sounds active in all 4 quadrants, non-tender, non-distended, tolerating TF Extremities: warm/dry, no edema  Skin: c/d/w. Sacral wound   Resolved problems:  Previously hemorrhagic shock requiring PRBCs, Septic shock, MRSA pneumonia s/p 8 days abx Complicated by MRSA healthcare associated pneumonia (treated) Peritonitis and perforated ulcer post repair 09/01/20  Assessment:   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 status post tracheostomy VDRF -VAP, pulm hygiene -cont efforts at attempting to wean vent - failed SBT 12/31 with incr RR   New MSSA HCAP vs bronchitis 12/24 Has had multiple rounds of abx for multiple VAP's this admission -Ancef  Persistent vasoplegia  -over 50 days -- big picture, long term pressors is not sustainable  -PRN pressors   -midodrine   Renal failure on CRRT  -over 50 days-- big picture, long term crrt not sustainable -Nephro following -Agree with ethics rec for time limited trial of CRRT: discussed with nephrology-- Monday, 11/01/2020 will end CRRT    Afib/DVT - heparin drip  Critical Illness Myopathy Severe deconditioning  Inadequate PO intake  - PT following - EN per RDN  Stage IV sacral ulcer -WOC,  hydrotherapy, dakins   History of stage IV colon cancer 1.9 cm cystic appearing lesion located in the pancreatic body  GOC -multiple [place] conversations with various PCCM providers and family have not yielded changes in scope of tx, multiple PCCM providers have been fired by family.  -concerns for futility of certain interventions -ethics consulted 12/30-- recs for time limited trial of CRRT, please see separate Ethics consult note for full details. Futility policy reviewed with family by ethics. -Will do time limited trial of CRRT- 11/01/20 will stop.    Best practice:  Diet: EN  Pain/Anxiety/Delirium protocol (if indicated) dilaudid IV pushes/ enteral sedation-  clonazepam, oxy, seroquel VAP protocol (if indicated): yes DVT prophylaxis: IV heparin GI prophylaxis: Protonix Glucose control: SSI, TF coverage, lantus Mobility: PT Family communication: pending 12/31  CRITICAL CARE Performed by: Cristal Generous   Total critical care time: 35  minutes  Critical care time was exclusive of separately billable procedures and treating other patients. Critical care was necessary to treat or prevent imminent or life-threatening deterioration.  Critical care was time spent personally by me on the following activities: development of treatment plan with patient and/or surrogate as well as nursing, discussions with consultants, evaluation of patient's  response to treatment, examination of patient, obtaining history from patient or surrogate, ordering and performing treatments and interventions, ordering and review of laboratory studies, ordering and review of radiographic studies, pulse oximetry and re-evaluation of patient's condition.  Eliseo Gum MSN, AGACNP-BC [place] [phone] If no answer, [phone] 10/29/2020, 9:34 AM

## 2020-10-29 NOTE — Progress Notes (Signed)
Spoke with pt's daughter [name] and provided update. She expressed frustration that she feels the ethics team misrepresented themselves as she states they expressed to her that they were there for her and her mother and later she understood they were there for the physicians.   I attempted to educate her that they are indeed there for the pt and family but also the medical team to help [place] all come to an appropriate and agreeable plan of care.   She expressed frustration that she has been unable to reach patient services or social worker today. I attempted along with Thurmond Butts, charge RN to locate a number but unfrotunately could not locate a person on call for the holiday weekend. I attempted to inquire about her questions that perhaps I could provide some temporary relief until she can contact them Monday but she stated that her questions were for the social worker.   She asked to speak with Angela Nevin, unit manager who is unavailable today as well.   She expressed frustration and concern that Dr Tamala Julian was a part of her mothers care still and I attempted to reiterate that everyone including Dr Tamala Julian has her best interests at heart but provided reassurance that Dr Tamala Julian was no longer involved in her mothers care per their wishes.   She was also frustrated that the decision to stop crrt on Sunday/monday was not discussed with her when, she states, the ethics committee stated it would be 5-7 days. I attempted to provide education that the ethics committee can make suggestions but that it was the medical team that would be the determining factors and that the 5-7 days was not necessary as her mother has been off pressors since 911 12/30 without any emergent needed for crrt  and thus we are able to transition her to HD in light of that. I also expressed to her that nephrology was not withholding dialysis at this time and that if her mother can transition to HD this is a good thing. I informed her that one cannot live on  crrt but can progress on iHD. I did state that a vented/trach pt on iHD is much more challenging and sometimes not possible as an outpt but that we needed to ensure she can tolerate it first.   She expressed understanding of the above. I encouraged her to call with any questions/concerns. I relayed I would be facilitating her care thru the weekend and would be available while I was here. I relayed that we are hopeful that this weekend remain stable for her mother and she successfully transitions to [facility] and we will face future challenges as they come.   All questions answered to best of my ability.

## 2020-10-29 NOTE — Progress Notes (Signed)
Physical Therapy Wound Treatment Patient Details  Name: Candace [name] MRN: [id number] Date of Birth: [birth year]  Today's Date: 10/29/2020 Time: 6195-0932 Time Calculation (min): 26 min  Subjective  Subjective: Lethargic on vent with intermittent eyes open.  Pain Score:    Wound Assessment     Pressure Injury 09/25/20 Sacrum Bilateral;Medial Deep Tissue Pressure Injury - Purple or maroon localized area of discolored intact skin or blood-filled blister due to damage of underlying soft tissue from pressure and/or shear. Purple, non-blanchable, b (Active)  Wound Image   10/20/20 1220  Dressing Type ABD;Gauze (Comment);Moist to dry;Impregnated gauze (petrolatum) 10/29/20 1143  Dressing Changed 10/29/20 1143  Dressing Change Frequency Daily 10/29/20 1143  State of Healing Eschar 10/29/20 1143  Site / Wound Assessment Yellow;Black;Pink 10/29/20 1143  % Wound base Red or Granulating 20% 10/29/20 1143  % Wound base Yellow/Fibrinous Exudate 70% 10/29/20 1143  % Wound base Black/Eschar 10% 10/29/20 1143  % Wound base Other/Granulation Tissue (Comment) 0% 10/29/20 1143  Peri-wound Assessment Excoriated 10/29/20 1143  Wound Length (cm) 16 cm 10/27/20 1000  Wound Width (cm) 11 cm 10/27/20 1000  Wound Depth (cm) 4 cm 10/27/20 1000  Wound Surface Area (cm^2) 176 cm^2 10/27/20 1000  Wound Volume (cm^3) 704 cm^3 10/27/20 1000  Tunneling (cm) 0 10/13/20 1200  Undermining (cm) 0 10/13/20 1200  Margins Unattached edges (unapproximated) 10/29/20 1143  Drainage Amount Minimal 10/29/20 1143  Drainage Description Serosanguineous 10/29/20 1143  Treatment Debridement (Selective);Hydrotherapy (Pulse lavage);Packing (Impregnated strip) 10/29/20 1143     Dakin's solution used  Hydrotherapy Pulsed lavage therapy - wound location: sacrum Pulsed Lavage with Suction (psi): 12 psi Pulsed Lavage with Suction - Normal Saline Used: 1000 mL Pulsed Lavage Tip: Tip with splash shield Selective  Debridement Selective Debridement - Location: sacrum Selective Debridement - Tools Used: Scalpel;Scissors Selective Debridement - Tissue Removed: necrotic adipose tissue   Wound Assessment and Plan  Wound Therapy - Assess/Plan/Recommendations Wound Therapy - Clinical Statement: Wound essentially covered with dry adipose tissue except for edges.  Needs adipose tissue removed so wound can begin to fill in.  Will continue to follow for selective removal of unviable tissue, to decrease bioburden and promote wound bed healing. Wound Therapy - Functional Problem List: Global weakness and immobility Factors Delaying/Impairing Wound Healing: Diabetes Mellitus;Immobility;Multiple medical problems Hydrotherapy Plan: Debridement;Dressing change;Patient/family education;Pulsatile lavage with suction Wound Therapy - Frequency: 6X / week Wound Therapy - Follow Up Recommendations: Skilled nursing facility Wound Plan: See above  Wound Therapy Goals- Improve the function of patient's integumentary system by progressing the wound(s) through the phases of wound healing (inflammation - proliferation - remodeling) by: Decrease Necrotic Tissue to: 20% Decrease Necrotic Tissue - Progress: Progressing toward goal Increase Granulation Tissue to: 80% Increase Granulation Tissue - Progress: Progressing toward goal  Goals will be updated until maximal potential achieved or discharge criteria met.  Discharge criteria: when goals achieved, discharge from hospital, MD decision/surgical intervention, no progress towards goals, refusal/missing three consecutive treatments without notification or medical reason.  GP     Shanna Cisco 10/29/2020, 11:52 AM  10/29/2020 Lesleigh Noe, PT Acute Rehabilitation Services Pager:  [phone] Office:  [phone]

## 2020-10-29 NOTE — Progress Notes (Signed)
Increased fluid removal rate on CRRT to compensate for NG intake while giving medications. Shortly after, patients HR increased from 100's to 120's, RR increased to 30 and she had worsening work of breathing. Decreased fluid removal back down to previous amount and increased Precedex.

## 2020-10-29 NOTE — Progress Notes (Addendum)
Update:  Mylo Red at bedside. Candace and DJ on phone. Provided clinical updates for the day. Discussed plan for time trial CRRT. Discussed conversation with ethics 12/30, reviewed concept of medical futility.   Family expresses feelings of frustration, mistrust, dissatisfaction, and the perception that "lots of the doctors want to just end her life."  Emotional support provided RE prolonged critical illness of a loved one. Stated that medical professionals want successful patient outcomes, and stated that a challenging aspect of medical care is reflecting on if interventions are offering benefit.   Family asked RE physical exam, which I explained, and family did not believe this findings citing their previous experiences where she can old her hands above her head. I shared that the patient mouthed "I want to stop," which they were in disbelief about stating "we know our mother she would never say that."  I stated that I can only share own experiences, during my own time with the patient, and am not discrediting their communications/experiences with their mother.   Family apologized for yelling.   Family would like to speak with nephrology to understand specifics RE plan for stopping CRRT (what time, will HD be attempted, if so when). I stated that nephrology has seen the patient today, I am happy to reach out but am unsure if they will be able to see again.   All questions answered.   Total time spent: 60 minutes _________________________________  Update: Daughter Jazz at bedside. She asked to step outside of room to discuss ethics meeting, plan of care.  I stated that we will do a time-limited trial of CRRT, with CRRT to end Sunday 10/31/2020, and trial iHD Monday 11/01/20 to see if renal recovery/if tolerates.  If she does not  Have recovery,  will not restart CRRT.    Rosanne Sack states that her sister, [name], is coming to the hospital soon and asks that I discuss this again with her.   ______________________________________  PCCM Family Communication  Attempted to reach pt daughter, [name] by phone-- unable to reach.  Will retry later.   Eliseo Gum MSN, AGACNP-BC [facility] 10/29/2020, 9:59 AM

## 2020-10-30 DIAGNOSIS — U071 COVID-19: Secondary | ICD-10-CM | POA: Diagnosis not present

## 2020-10-30 LAB — RENAL FUNCTION PANEL
Albumin: 2.2 g/dL — ABNORMAL LOW (ref 3.5–5.0)
Albumin: 2.1 g/dL — ABNORMAL LOW (ref 3.5–5.0)
Anion gap: 12 (ref 5–15)
Anion gap: 9 (ref 5–15)
BUN: 34 mg/dL — ABNORMAL HIGH (ref 8–23)
BUN: 33 mg/dL — ABNORMAL HIGH (ref 8–23)
CO2: 23 mmol/L (ref 22–32)
CO2: 23 mmol/L (ref 22–32)
Calcium: 10.5 mg/dL — ABNORMAL HIGH (ref 8.9–10.3)
Calcium: 10.4 mg/dL — ABNORMAL HIGH (ref 8.9–10.3)
Chloride: 98 mmol/L (ref 98–111)
Chloride: 100 mmol/L (ref 98–111)
Creatinine, Ser: 0.57 mg/dL (ref 0.44–1.00)
Creatinine, Ser: 0.67 mg/dL (ref 0.44–1.00)
GFR, Estimated: >60 mL/min (ref >60)
GFR, Estimated: >60 mL/min (ref >60)
Glucose, Bld: 215 mg/dL — ABNORMAL HIGH (ref 70–99)
Glucose, Bld: 272 mg/dL — ABNORMAL HIGH (ref 70–99)
Phosphorus: 3.3 mg/dL (ref 2.5–4.6)
Phosphorus: 4.5 mg/dL (ref 2.5–4.6)
Potassium: 4.5 mmol/L (ref 3.5–5.1)
Potassium: 5.1 mmol/L (ref 3.5–5.1)
Sodium: 133 mmol/L — ABNORMAL LOW (ref 135–145)
Sodium: 132 mmol/L — ABNORMAL LOW (ref 135–145)

## 2020-10-30 LAB — GLUCOSE, CAPILLARY
Glucose-Capillary: 192 mg/dL — ABNORMAL HIGH (ref 70–99)
Glucose-Capillary: 218 mg/dL — ABNORMAL HIGH (ref 70–99)
Glucose-Capillary: 225 mg/dL — ABNORMAL HIGH (ref 70–99)
Glucose-Capillary: 227 mg/dL — ABNORMAL HIGH (ref 70–99)
Glucose-Capillary: 227 mg/dL — ABNORMAL HIGH (ref 70–99)
Glucose-Capillary: 236 mg/dL — ABNORMAL HIGH (ref 70–99)

## 2020-10-30 LAB — CBC
HCT: 26.5 % — ABNORMAL LOW (ref 36.0–46.0)
Hemoglobin: 7.5 g/dL — ABNORMAL LOW (ref 12.0–15.0)
MCH: 27.7 pg (ref 26.0–34.0)
MCHC: 28.3 g/dL — ABNORMAL LOW (ref 30.0–36.0)
MCV: 97.8 fL (ref 80.0–100.0)
Platelets: 248 10*3/uL (ref 150–400)
RBC: 2.71 MIL/uL — ABNORMAL LOW (ref 3.87–5.11)
RDW: 16.1 % — ABNORMAL HIGH (ref 11.5–15.5)
WBC: 23.1 10*3/uL — ABNORMAL HIGH (ref 4.0–10.5)
nRBC: 3.8 % — ABNORMAL HIGH (ref 0.0–0.2)

## 2020-10-30 LAB — HEPARIN LEVEL (UNFRACTIONATED)
Heparin Unfractionated: >2.2 IU/mL — ABNORMAL HIGH (ref 0.30–0.70)
Heparin Unfractionated: 0.32 IU/mL (ref 0.30–0.70)

## 2020-10-30 LAB — LACTIC ACID, PLASMA
Lactic Acid, Venous: 1.8 mmol/L (ref 0.5–1.9)
Lactic Acid, Venous: 1.9 mmol/L (ref 0.5–1.9)

## 2020-10-30 LAB — CORTISOL: Cortisol, Plasma: 23.9 ug/dL

## 2020-10-30 LAB — MAGNESIUM: Magnesium: 2.7 mg/dL — ABNORMAL HIGH (ref 1.7–2.4)

## 2020-10-30 LAB — PROCALCITONIN: Procalcitonin: 3.65 ng/mL

## 2020-10-30 MED ORDER — ALBUMIN HUMAN 5 % IV SOLN
12.5000 g | Freq: Once | INTRAVENOUS | Status: AC
  Administered 2020-10-30: 12.5 g via INTRAVENOUS
  Filled 2020-10-30: qty 250

## 2020-10-30 MED ORDER — CLONAZEPAM 0.5 MG PO TBDP
1.0000 mg | ORAL_TABLET | Freq: Three times a day (TID) | ORAL | Status: DC
  Administered 2020-10-30 – 2020-11-05 (×20): 1 mg
  Filled 2020-10-30 (×20): qty 2

## 2020-10-30 MED ORDER — INSULIN ASPART 100 UNIT/ML ~~LOC~~ SOLN
8.0000 [IU] | SUBCUTANEOUS | Status: DC
  Administered 2020-10-30 – 2020-10-31 (×7): 8 [IU] via SUBCUTANEOUS

## 2020-10-30 MED ORDER — INSULIN GLARGINE 100 UNIT/ML ~~LOC~~ SOLN
45.0000 [IU] | Freq: Every day | SUBCUTANEOUS | Status: DC
  Administered 2020-10-30 – 2020-11-01 (×3): 45 [IU] via SUBCUTANEOUS
  Filled 2020-10-30 (×4): qty 0.45

## 2020-10-30 MED ORDER — MIDODRINE HCL 5 MG PO TABS
40.0000 mg | ORAL_TABLET | Freq: Three times a day (TID) | ORAL | Status: DC
  Administered 2020-10-30 – 2020-11-18 (×55): 40 mg
  Filled 2020-10-30 (×55): qty 8

## 2020-10-30 MED ORDER — SODIUM CHLORIDE 0.9 % IV BOLUS
500.0000 mL | Freq: Once | INTRAVENOUS | Status: AC
  Administered 2020-10-30: 500 mL via INTRAVENOUS

## 2020-10-30 MED ORDER — QUETIAPINE FUMARATE 100 MG PO TABS
100.0000 mg | ORAL_TABLET | Freq: Two times a day (BID) | ORAL | Status: DC
  Administered 2020-10-30 – 2020-11-06 (×14): 100 mg
  Filled 2020-10-30 (×13): qty 1

## 2020-10-30 NOTE — Progress Notes (Signed)
Updated pt's daughter [name] of the decline from [facility] in transfer stating they could offer no further treatment modalities than we are doing. They also are at capacity.   Additionally we reviewed again her mother's challenging case. She expressed frustration at the decision making process and continues to be concerned that Dr Tamala Julian was integral in the decision to stop crrt. I have attempted to again provide reassurance that the decision to attempt to transition her off of crrt to iHD was inevitable and was made with the nephrology/ccm/ethics team after extensive chart review but that Dr Tamala Julian has not been involved in the patients care since 12/29 when the family asked for him to transfer care.   I provided continued support and reiteration that every member of our team, including Dr Tamala Julian wants to do everything in our power to help her mother. Yet sometimes the help is not in the form of just extending life but in ensuring quality of life and that she and her mother and other family members are integral in determining what that quality looks like for the patient.   She stated that she did not understand why some patients can stay on vent for much longer tahn her mom and why we would change her to a DNR if she cannot transition to iHD. I explained again that the vent is not the issue. That a patient may live their entire life with vent support via trach, but that crrt is not consistent with viability, it is a temporary bridge to iHD which she could continue outside of the hospital and if she cannot then very unfortunately she will inevitably have electrolyte/metabolic abnormalities that will cause her death.   [name] did state and lay out very clearly that she would liek the transition attempt to be when her mother is not being evaluated for increasing wbc/hypotension etc with potential for repeated infection. I did let her know that her wbc may not be 2/2 a new infection, that her lactate is normal but we  were awaiting a few other labs to help determine. I also informed her that if she has a new infection this would certainly be a step backward. However, understandably she has requested to speak with the nephrologist prior to this transition to inquire about the potential delay for further optimization.   All questions were answered to the best of my ability.

## 2020-10-30 NOTE — Progress Notes (Signed)
ANTICOAGULATION CONSULT NOTE  Pharmacy Consult:  Heparin Indication: atrial fibrillation   Labs: Recent Labs    10/28/20 0341 10/28/20 1636 10/29/20 0455 10/29/20 1607 10/30/20 0500 10/30/20 0724  HGB 7.2*  --  7.2*  --  7.5*  --   HCT 23.4*  --  24.4*  --  26.5*  --   PLT 273  --  237  --  248  --   HEPARINUNFRC 0.42  --  0.37  --  >2.20* 0.32  CREATININE 0.61   < > 0.63 0.58 0.57  --    < > = values in this interval not displayed.    Assessment: 70 yo female with history of Afib on apixaban prior to admission, which has been held and patient transitioned to IV heparin.   Today the heparin level remains therapeuticon current rate of 1300 units/hr (13 mL/hr). There has been no further bleeding per RN and her hemoglobin remains low-stable with normal platelet count.  Hep lvl of > 2.2 was drawn in error - drawn in PICC with heparin infusion, so can be ignored.   Goal of Therapy:  Heparin level 0.3-0.5 units/ml   Plan:  Continue heparin gtt at 1300 units/hr Daily heparin level and CBC Monitor for bleeding  Barth Kirks, PharmD, BCPS, BCCCP Clinical Pharmacist [phone]  Please check AMION for all [place] numbers  10/30/2020 8:15 AM

## 2020-10-30 NOTE — Progress Notes (Signed)
NAMERaiden Yearwood, MRN:  [id number], DOB:  09/18/1951, LOS: 71 ADMISSION DATE:  08/31/2020, CONSULTATION DATE:  09/07/2020 REFERRING MD:  Dr Candiss Norse, CHIEF COMPLAINT:  Acute resp failure  Brief History   70 year old female who was previously diagnosed with Covid 08/23/2020.  Admitted 11/2 with AF-RVR, found to have a perforated duodenal underwent exploratory laparotomy with Phillip Heal patch placement.  Past Medical History  Covid pneumonia Atrial fibrillation CKD stage III Diabetes mellitus Hypertension Colon cancer Hyperlipidemia  Significant Hospital Events   11/2 Admitted  11/3 OR with findings of perforated duodenal ulcer 11/10 progressive hemorrhagic shock, intubated, transfused, pressors, proned; started on CRRT in PM 11/03 Exploratory laparotomy, Phillip Heal patch, lysis of adhesion for duodenal ulceration postop day 6 11/16 Extubated. Re-intubated overnight due to respiratory distress and hypoxia with decreased mentation 11/18 Bronch, cultures sent 11/19 Hgb down getting blood 11/24 Spiked fever resume empirical antimicrobial therapy 11/26 Hemorrhagic shock, hgb 5.6, increased pressors, CT A/P  11/30 Per palliative "[name] expresses understanding that patient is unfortunately very fragile despite ongoing intensive medical treatment and full mechanical support. She indicates that the family wants to continue with all current interventions despite potential outcomes". 12/08 CRRT discontinued due to clotting 12/09 Family requested transfer to tertiary care [facility] denied transfer  12/10 CRRT restarted. Episodes of tachycardia, tachypnea that seem to improve with pain management 12/11 Back in shock. Pressor requirements up. CXR worse. ABX resumed 12/12 Still requiring inc pressors. Had hypoglycemic event. Basal insulin dosing adjusted 12/13 Pressor requirements better. Now hyperglycemic. Re-adjusted Glycemic control  12/14 Changed dilaudid to1/2 dosing from day further. D/c  vasopressin.  Goals of care reconfirmed with daughter.  Patient continues to desire aggressive care.  Not open to discussing any other option, patient family continues to be hopeful that she will be discharged to home with full recovery in spite of multiple attempts by staff to prepare them that this is unlikely scenario 12/15 Dilaudid discontinued sending cortisol for ongoing pressor dependence 12/16 Serum cortisol <20, added stress dose steroids.  PRN Dilaudid, attempting not go back on Dilaudid infusion 12/17 Developed worsening tachycardia during the evening hours received initially IV albumin, followed by resuming IV Dilaudid with question of suboptimal pain control.  Currently looks better back on Dilaudid drip.  We have been able to wean pressors after adding stress dose steroids; near arrest - bradycardia, better with atropine  12/19 Afebrile . Remains on dilaudid and heparin gtt, dilaudid gtt increased overnight for concern of pain / ongoing tachycardia, no other events . NE and precedex off 12/17. Ongoing CRRT- even UF, dosing lokelmia this morning 12/20 On CRRT.  Renal plans for HD cath and moving to HD. Getting wound care  12/23 -No vent weaning per RT, remains on full support, #8 trach in place but previously had #6, no documentation of change noted in chart Afebrile / WBC 20.4  Vent - 30% FiO2, PEEP 5 Glucose range 176-212 I/O 465 ml stool, 3.6L removed with HD, -1.1L in last 24 hours  RN reports ongoing periods of tachypnea / vent dyssynchrony that responds to dilaudid   12/24 - renal stopping CRRT today and plans HD 10/24/20 . 40% fio2 on vent via Trach. TAchypenic and tachycardic. Afebrile but wbc up to 27.6K. On TF. Onn heparin gtt  12/25 - Back on CRRT. On vent via trach at 40% fio2, On scheduled dilaudid as add on to oxy. Per family request 12/24 - they felt scheduled oxy was not adequate and patien was showing signs  of opioid withdrawal.  Patient also had  worsening SIRS/sepsis  syndrome. Had fever, rising wbc, worsening RR 40-60 and HR 140s sinsu-> strated On abx yesterday. Fever some better today. WBC plateau at 28,.5K. On new levophed -> signifanct improvement in HR 77 and RR t0 20. On heparin gtt.  On precedex gtt. On levophed gtt 5mcg wthi midodrine. On TF 12/30 Remains on CRRT with intermittent pressor requirements. Ethics consult placed evening of 12/29. Ethics rec time trial of CRRT 12/31 failed SBT with RR 40s. Several conversations between care tam and family, who are upset RE plan of care 1/1 back on pressors   Consults:  Cardiology [facility] Nephrology  Ethics  Procedures:  R PICC 11/5 >> A line 11/9 >> out ETT 11/9 > 11/16, 11/16 >> 09/21/2020, 09/21/2020 tracheostomy>> Lt [place] CVL 11/9 >> R IJ trialysis >> out HD catheter 12/1 >>12/20 12/21 - 14.5 Fr, 23 cm right IJ tunneled hemodialysis catheter placement.  Removal of indwelling subclavian catheter.   Significant Diagnostic Tests:  11/3 CT abd/ pelvis > 1. Positive for bowel perforation: Pneumoperitoneum and intermediate density free fluid in the abdomen. Prior total colectomy. The specific site of perforation is unclear-oral contrast present to the proximal jejunum has not obviously leaked. Note that there may be small bowel loops adherent to the ventral abdominal wall along the greater curve of the stomach. 2. Extensive bilateral lower lung pneumonia. No pleural effusion. 3. Other abdominal and pelvic viscera are stable since 2015, including bilateral adrenal adenomas. Chronic renal parapelvic cysts. 4. Aortic Atherosclerosis 11/3 TTE > EF 70-75%, RV not well visualized, mildly reduced RV systolic function 84/13 CT chest/ abd/ pelvis> 1. Interval progression of diffuse bilateral hazy ground-glass airspace opacities with more focal areas of consolidation at the lung bases 2. Trace bilateral pleural effusions. 3. Postsurgical changes the abdomen as detailed above. No evidence for a postoperative  abscess, however evaluation is limited by lack of IV contrast. 4. There is a 1.9 cm cystic appearing lesion located in the pancreatic body. This was not present on the patient's CT from 2015.  Follow-up with an outpatient contrast enhanced MRI is recommended. 5. The endometrial stripe appears diffusely thickened. Follow-up with pelvic ultrasound is recommended. Aortic Atherosclerosis 11/14 LE doppler studies > + DVT of right posterior tibial and peroneal vein, +dVT of left posterior tibial vein  11/26 CT ABD PEL > liver unremarkable, distended gallbladder with layering tiny gallstones versus sludge, no duct dilatation, mild hyperdensity right upper pole renal collecting system new.  No evidence of retroperitoneal bleeding.  Multifocal lower lobe predominant pulmonary infiltrates/pneumonia.  Small left pleural effusion.  Micro Data:  11/10 MRSA PCR > neg 11/10 BC x 2 > neg 09/22/2020 blood cultures x2>> S epi.  09/22/2020 sputum culture>> MRSA Blood 12/1 >> negative. S.epi 1 out of 2, likely contaminant BCx 2 12/12 >> neg xxx Trach aspirate 12/24 - FEW STAPHYLOCOCCUS AUREUS  Blood 12/24 > Negative   Antimicrobials:  azithro 11/2 >11/3 Ceftriaxone 11/2  Fluconazole 11/3 Zosyn 11/3 >> 11/7 Vanc 11/10 off Cefepime 11/10 > 11/16 09/22/2020 vancomycin for MRSA PNA >> 12/2 xxxx Vanc 12/11> 12/17 Zosyn 12/11> 12/17 xxxx vanc 12/24 (staph resp cutlure) >> 12/28 Zosyn 12/24 - 12/27 Cefazolin 12/27 >>   Subjective:   Started back on pressors overnight Continues on CRRT  Large incr in WBC from 16 to 23, Glu elevated  Labs otherwise stable   Objective   Blood pressure (!) 82/52, pulse 84, temperature (!) 97.5 F (36.4 C),  temperature source Axillary, resp. rate (!) 23, height 5\' 8"  (1.727 m), weight 75.2 kg, SpO2 100 %.    Vent Mode: PRVC FiO2 (%):  [40 %] 40 % Set Rate:  [28 bmp] 28 bmp Vt Set:  [450 mL] 450 mL PEEP:  [5 cmH20] 5 cmH20 Plateau Pressure:  [22 cmH20-28 cmH20] 22  cmH20   Intake/Output Summary (Last 24 hours) at 10/30/2020 0733 Last data filed at 10/30/2020 0700 Gross per 24 hour  Intake 3198.64 ml  Output 4311 ml  Net -1112.36 ml   Filed Weights   10/28/20 0338 10/29/20 0500 10/30/20 0500  Weight: 78.4 kg 77.9 kg 75.2 kg   Physical Exam  General: Chronically and critically ill debilitated F, reclined in bed NAD  HEENT: trach secure. Anicteric sclera. Pink mm.  Neuro: Opens eyes, following simple commands, flicker bilat feet. Can lift R hand off bed not wrist, can life L fingers off bed not hand.  CV: s1s2 no rgm cap refill < 3 seconds  PULM:  Bilateral rhonchi, remains vent dependent with mild tachypnea  GI: soft round ndnt.  Extremities: Decreased muscle tone BUE BLE. BLE boots.  Skin: c/d/w. Sacral wound.   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 VDRF -VAP, pulm hygiene -continue vent wean efforts -CXR   New MSSA HCAP vs bronchitis 12/24 Has had multiple rounds of abx for multiple VAP's this admission -Ancef -CXR 1/1, check pct, LA  Persistent vasoplegia  -over 50 days -- big picture, long term pressors is not sustainable  -Increasing midodrine  -PRN pressors -check cortisol, fungitell   Renal failure on CRRT  -over 50 days-- big picture, long term crrt not sustainable -Nephro following -Agree with ethics rec for time limited trial of CRRT: discussed with nephrology-- stop CRRT 1/2   Afib/DVT - heparin drip  Critical Illness Myopathy Severe deconditioning  Inadequate PO intake  - PT  following - EN per RDN  DM with hyperglycemia -incr lantus 1/1. Cont SSI, EN coverage   Stage IV sacral ulcer -WOC, hydrotherapy, dakins   History of stage IV colon cancer 1.9 cm cystic appearing lesion located in the pancreatic body  GOC -multiple [place] conversations with various PCCM providers and family have not yielded changes in scope of tx, multiple PCCM providers have been fired by family.  -concerns for futility of certain interventions -ethics consulted 12/30-- recs for time limited trial of CRRT, please see separate Ethics consult note for full details. Futility policy reviewed with family by ethics. -Will do time limited trial of CRRT-- end CRRT 1/2, iHD trial 1/3. If fails HD, will implement futility policy -0/9/38 family requesting transfer to [facility]. Will call.    Best practice:  Diet: EN  Pain/Anxiety/Delirium protocol (if indicated) seroquel dilaudid clonazepam VAP protocol (if indicated): yes DVT prophylaxis: IV heparin GI prophylaxis: Protonix Glucose control: SSI, TF coverage, lantus Mobility: PT Family communication: updated 10/31/19 by Dr. Ruthann Cancer  CRITICAL CARE Performed by: Cristal Generous  Total critical care time: 38 minutes  Critical care time was exclusive of separately billable procedures and treating other patients. Critical care was necessary to treat or prevent imminent or life-threatening deterioration.  Critical  care was time spent personally by me on the following activities: development of treatment plan with patient and/or surrogate as well as nursing, discussions with consultants, evaluation of patient's response to treatment, examination of patient, obtaining history from patient or surrogate, ordering and performing treatments and interventions, ordering and review of laboratory studies, ordering and review of radiographic studies, pulse oximetry and re-evaluation of patient's condition.  Eliseo Gum MSN, AGACNP-BC [place] [phone] If no answer, [phone] 10/30/2020, 7:33 AM

## 2020-10-30 NOTE — Progress Notes (Signed)
[facility] NEPHROLOGY PROGRESS NOTE  Assessment/ Plan:  # AKI- oliguric now likely anuric. ATN for shock.Started on CRRT 09/08/20 due to persistent oliguria/anuria and hyperkalemia.Sutcliffe placed on 10/19/20. -All fluids 4K/2.5Ca: Pre-filter to 500 ml/hr, post-filter 200 ml/hr, and dialysate to 1000 ml/hr. Keeping even to 50 cc/hr. No anticoagulation due to thrombocytopenia and bleeding -Patient has failed multiple transitions to HD w/ low Bps requiring pressors -No signs of meaningful recovery from our team -Family expressed ongoing wishes for aggressive care -Ethic seen 12/30 and suggested time trial of 5-7 days CV while finishing treatment for PNA -Abx starting 27th so will plan for no restart on Sunday and HD on Monday; family wants to be there for hemodialysis.  Goal will be starting hemodialysis between 11 AM and 2 PM -If she fails HD then would implement futility policy as outline by ethics and not restart HD or CRRT  # Hemorrhagic shock- ongoing blood loss and requiring pressors intermittently. Transfuse as needed, have added ESA.  Hemoglobin stable today  # Septic shock due to MRSA pneumonia.  Levophed IV as needed  # Perforated duodenal ulcer- s/p exploratory lap and Graham patch placement per surgery.  # Acute hypoxic/hypercapnic respiratory failure due to ARDS from Recent Covid-19 PNA- currently on vent via trach per PCCM. low o2.  # P. Atrial fibrillation- no anticoagulation due to GI bleed.  # Hyponatremia- worsened quickly off of CRRT-  manage with crrt.   # Severe protein malnutrition- per primary svc.  # Disposition- poor overall prognosis and will likely require LTAC and chronic vent support if we can ever get her transitioned to IHD.  This is unlikely at this time.   Subjective: Seen and examined in ICU. No interaction today. Stable on CV  I discussed the patient's care and plan with daughter yesterday.  Agreed with 5 to 7 days of antibiotics prior  to starting hemodialysis.  Antibiotics were started on 12/27.  Discussed plan will be no restart CRRT on Sunday with trial of hemodialysis on Monday  Objective Vital signs in last 24 hours: Vitals:   10/30/20 0615 10/30/20 0630 10/30/20 0645 10/30/20 0810  BP: 93/62 90/65 (!) 82/52   Pulse: 89  84   Resp: (!) 32 (!) 35 (!) 23   Temp:    (!) 97 F (36.1 C)  TempSrc:    Axillary  SpO2: 100%  100%   Weight:      Height:       Weight change: -2.7 kg  Intake/Output Summary (Last 24 hours) at 10/30/2020 0813 Last data filed at 10/30/2020 0700 Gross per 24 hour  Intake 3103.76 ml  Output 4161 ml  Net -1057.24 ml       Labs: Basic Metabolic Panel: Recent Labs  Lab 10/29/20 0455 10/29/20 1607 10/30/20 0500  NA 133* 130* 133*  K 4.4 4.6 4.5  CL 99 98 98  CO2 _0 GLUCOSE 187* 198* 215*  BUN 35* 41* 34*  CREATININE 0.63 0.58 0.57  CALCIUM 10.2 10.1 10.5*  PHOS 3.1 2.5 3.3   Liver Function Tests: Recent Labs  Lab 10/29/20 0455 10/29/20 1607 10/30/20 0500  ALBUMIN 2.1* 2.1* 2.2*   No results for input(s): LIPASE, AMYLASE in the last 168 hours. No results for input(s): AMMONIA in the last 168 hours. CBC: Recent Labs  Lab 10/26/20 0459 10/27/20 0452 10/28/20 0341 10/29/20 0455 10/30/20 0500  WBC 15.4* 23.1* 16.0* 16.0* 23.1*  HGB 7.4* 7.2* 7.2* 7.2* 7.5*  HCT 25.6*  23.7* 23.4* 24.4* 26.5*  MCV 96.6 94.4 94.0 96.4 97.8  PLT 293 280 273 237 248   Cardiac Enzymes: No results for input(s): CKTOTAL, CKMB, CKMBINDEX, TROPONINI in the last 168 hours. CBG: Recent Labs  Lab 10/29/20 1524 10/29/20 1931 10/29/20 2316 10/30/20 0414 10/30/20 0744  GLUCAP 201* 223* 214* 225* 227*    Iron Studies: No results for input(s): IRON, TIBC, TRANSFERRIN, FERRITIN in the last 72 hours. Studies/Results: No results found.  Medications: Infusions: .  prismasol BGK 4/2.5 500 mL/hr at 10/30/20 0451  .  prismasol BGK 4/2.5 200 mL/hr at 10/29/20 0837  . sodium chloride  10 mL/hr at 10/30/20 0700  . sodium chloride    .  ceFAZolin (ANCEF) IV Stopped (10/30/20 0553)  . dexmedetomidine (PRECEDEX) IV infusion 0.7 mcg/kg/hr (10/30/20 0700)  . feeding supplement (VITAL 1.5 CAL) 1,000 mL (10/29/20 2002)  . heparin 1,300 Units/hr (10/30/20 0700)  . norepinephrine (LEVOPHED) Adult infusion 3 mcg/min (10/30/20 0700)  . prismasol BGK 4/2.5 1,000 mL/hr at 10/30/20 0442    Scheduled Medications: . B-complex with vitamin C  1 tablet Per Tube Daily  . chlorhexidine gluconate (MEDLINE KIT)  15 mL Mouth Rinse BID  . Chlorhexidine Gluconate Cloth  6 each Topical Daily  . cholecalciferol  2,000 Units Per Tube Daily  . clonazepam  1 mg Per Tube BID  . [START ON 10/31/2020] collagenase   Topical Daily  . darbepoetin (ARANESP) injection - DIALYSIS  100 mcg Subcutaneous Q Tue-1800  . docusate  100 mg Per Tube BID  . feeding supplement (PROSource TF)  90 mL Per Tube TID  . guaiFENesin  15 mL Per Tube Q12H  .  HYDROmorphone (DILAUDID) injection  1 mg Intravenous Q4H  . influenza vaccine adjuvanted  0.5 mL Intramuscular Tomorrow-1000  . insulin aspart  0-15 Units Subcutaneous Q4H  . insulin aspart  8 Units Subcutaneous Q4H  . insulin glargine  45 Units Subcutaneous Daily  . mouth rinse  15 mL Mouth Rinse 10 times per day  . midodrine  30 mg Per Tube TID WC  . nutrition supplement (JUVEN)  1 packet Per Tube BID BM  . pantoprazole sodium  40 mg Per Tube BID  . polyethylene glycol  17 g Per Tube Daily  . QUEtiapine  50 mg Per Tube BID  . sodium chloride flush  10-40 mL Intracatheter Q12H  . sodium hypochlorite   Topical BID    have reviewed scheduled and prn medications.  Physical Exam:  General: Critically ill appearing, eyes open, no interaction Heart:tachycardia, no audible murmur Lungs: Coarse breath sound bilateral, bilateral chest rise Abdomen:soft,  non-distended Extremities:trace edema in BLE Dialysis Access: Right IJ TDC  Fallou Hulbert J Ashleymarie Granderson 10/30/2020,8:13 AM   LOS: 59 days

## 2020-10-30 NOTE — Progress Notes (Signed)
Physical Therapy Wound Treatment Patient Details  Name: Candace [name] MRN: [id number] Date of Birth: 1951-08-06  Today's Date: 10/30/2020 Time: 0928-1000 Time Calculation (min): 32 min  Subjective  Subjective: Lethargic on vent with intermittent eyes open.  Pain Score:  0/10  Wound Assessment  Pressure Injury 09/25/20 Sacrum Bilateral;Medial Deep Tissue Pressure Injury - Purple or maroon localized area of discolored intact skin or blood-filled blister due to damage of underlying soft tissue from pressure and/or shear. Purple, non-blanchable, b (Active)  Wound Image   10/30/20 1052  Dressing Type ABD;Barrier Film (skin prep);Gauze (Comment);Moist to dry;Impregnated gauze (petrolatum);Other (Comment) 10/30/20 1052  Dressing Changed;Clean;Dry;Intact 10/30/20 1052  Dressing Change Frequency Daily 10/30/20 1052  State of Healing Eschar 10/30/20 1052  Site / Wound Assessment Yellow;Black;Pink 10/30/20 1052  % Wound base Red or Granulating 20% 10/29/20 1143  % Wound base Yellow/Fibrinous Exudate 70% 10/29/20 1143  % Wound base Black/Eschar 10% 10/29/20 1143  % Wound base Other/Granulation Tissue (Comment) 0% 10/29/20 1143  Peri-wound Assessment Excoriated 10/30/20 1052  Wound Length (cm) 16 cm 10/30/20 1052  Wound Width (cm) 11 cm 10/30/20 1052  Wound Depth (cm) 5 cm 10/30/20 1052  Wound Surface Area (cm^2) 176 cm^2 10/30/20 1052  Wound Volume (cm^3) 880 cm^3 10/30/20 1052  Tunneling (cm) 0 10/13/20 1200  Undermining (cm) 0 10/13/20 1200  Margins Unattached edges (unapproximated) 10/30/20 1052  Drainage Amount Minimal 10/30/20 1052  Drainage Description Serosanguineous 10/30/20 1052  Treatment Debridement (Selective);Hydrotherapy (Pulse lavage);Packing (Saline gauze) 10/30/20 1052   Hydrotherapy Pulsed lavage therapy - wound location: sacrum Pulsed Lavage with Suction (psi): 12 psi Pulsed Lavage with Suction - Normal Saline Used: 1000 mL Pulsed Lavage Tip: Tip with splash  shield Selective Debridement Selective Debridement - Location: sacrum Selective Debridement - Tools Used: Scalpel;Forceps Selective Debridement - Tissue Removed: necrotic adipose tissue   Wound Assessment and Plan  Wound Therapy - Assess/Plan/Recommendations Wound Therapy - Clinical Statement: Continued removal of adipose tissue.  Will continue to follow for selective removal of unviable tissue, to decrease bioburden and promote wound bed healing. Wound Therapy - Functional Problem List: Global weakness and immobility Factors Delaying/Impairing Wound Healing: Diabetes Mellitus;Immobility;Multiple medical problems Hydrotherapy Plan: Debridement;Dressing change;Patient/family education;Pulsatile lavage with suction Wound Therapy - Frequency: 6X / week Wound Therapy - Follow Up Recommendations: Skilled nursing facility Wound Plan: See above  Wound Therapy Goals- Improve the function of patient's integumentary system by progressing the wound(s) through the phases of wound healing (inflammation - proliferation - remodeling) by: Decrease Necrotic Tissue to: 20% Decrease Necrotic Tissue - Progress: Progressing toward goal Increase Granulation Tissue to: 80% Increase Granulation Tissue - Progress: Progressing toward goal Goals/treatment plan/discharge plan were made with and agreed upon by patient/family: No, Patient unable to participate in goals/treatment/discharge plan and family unavailable Time For Goal Achievement: 7 days Wound Therapy - Potential for Goals: Fair  Goals will be updated until maximal potential achieved or discharge criteria met.  Discharge criteria: when goals achieved, discharge from hospital, MD decision/surgical intervention, no progress towards goals, refusal/missing three consecutive treatments without notification or medical reason.  GP  [name], PT, DPT Acute Rehabilitation Services Pager [phone] Office [phone]      [name] 10/30/2020, 10:57 AM

## 2020-10-30 NOTE — Progress Notes (Signed)
eLink Physician-Brief Progress Note Patient Name: Candace [name] DOB: Mar 23, 1951 MRN: [id number]   Date of Service  10/30/2020  HPI/Events of Note  Hypotension, MAP is 64. RN started her shift with 4 mic/min levophed and it is now at 10 mic/min. HR is 120. O2 sat 96 on 40% fio2. No distress on camera and had been given a dilaudid push. Is also on heparin and precedex IV.   eICU Interventions  RN had already stopped pulling fluids via CRRT Try albumin x 1 Notify us if improvement does not occur Notes indicate issues with tolerating CRRT / HD D/W RN       Intervention Category Major Interventions: Shock - evaluation and management  Margaretmary Lombard 10/30/2020, 10:59 PM

## 2020-10-31 ENCOUNTER — Inpatient Hospital Stay (HOSPITAL_COMMUNITY): Payer: Medicare Other

## 2020-10-31 DIAGNOSIS — R0902 Hypoxemia: Secondary | ICD-10-CM | POA: Diagnosis not present

## 2020-10-31 LAB — PROCALCITONIN: Procalcitonin: 3.62 ng/mL

## 2020-10-31 LAB — RENAL FUNCTION PANEL
Albumin: 2.6 g/dL — ABNORMAL LOW (ref 3.5–5.0)
Albumin: 2.5 g/dL — ABNORMAL LOW (ref 3.5–5.0)
Anion gap: 12 (ref 5–15)
Anion gap: 10 (ref 5–15)
BUN: 29 mg/dL — ABNORMAL HIGH (ref 8–23)
BUN: 31 mg/dL — ABNORMAL HIGH (ref 8–23)
CO2: 24 mmol/L (ref 22–32)
CO2: 22 mmol/L (ref 22–32)
Calcium: 10.8 mg/dL — ABNORMAL HIGH (ref 8.9–10.3)
Calcium: 11 mg/dL — ABNORMAL HIGH (ref 8.9–10.3)
Chloride: 98 mmol/L (ref 98–111)
Chloride: 101 mmol/L (ref 98–111)
Creatinine, Ser: 0.58 mg/dL (ref 0.44–1.00)
Creatinine, Ser: 0.57 mg/dL (ref 0.44–1.00)
GFR, Estimated: >60 mL/min (ref >60)
GFR, Estimated: >60 mL/min (ref >60)
Glucose, Bld: 214 mg/dL — ABNORMAL HIGH (ref 70–99)
Glucose, Bld: 240 mg/dL — ABNORMAL HIGH (ref 70–99)
Phosphorus: 3.5 mg/dL (ref 2.5–4.6)
Phosphorus: 3.5 mg/dL (ref 2.5–4.6)
Potassium: 5 mmol/L (ref 3.5–5.1)
Potassium: 5 mmol/L (ref 3.5–5.1)
Sodium: 134 mmol/L — ABNORMAL LOW (ref 135–145)
Sodium: 133 mmol/L — ABNORMAL LOW (ref 135–145)

## 2020-10-31 LAB — CBC
HCT: 24.3 % — ABNORMAL LOW (ref 36.0–46.0)
Hemoglobin: 7.4 g/dL — ABNORMAL LOW (ref 12.0–15.0)
MCH: 29.2 pg (ref 26.0–34.0)
MCHC: 30.5 g/dL (ref 30.0–36.0)
MCV: 96 fL (ref 80.0–100.0)
Platelets: 240 10*3/uL (ref 150–400)
RBC: 2.53 MIL/uL — ABNORMAL LOW (ref 3.87–5.11)
RDW: 16.2 % — ABNORMAL HIGH (ref 11.5–15.5)
WBC: 25.5 10*3/uL — ABNORMAL HIGH (ref 4.0–10.5)
nRBC: 5 % — ABNORMAL HIGH (ref 0.0–0.2)

## 2020-10-31 LAB — CBC WITH DIFFERENTIAL/PLATELET
Abs Immature Granulocytes: 1.51 10*3/uL — ABNORMAL HIGH (ref 0.00–0.07)
Basophils Absolute: 0.1 10*3/uL (ref 0.0–0.1)
Basophils Relative: 1 %
Eosinophils Absolute: 0.3 10*3/uL (ref 0.0–0.5)
Eosinophils Relative: 1 %
HCT: 25.5 % — ABNORMAL LOW (ref 36.0–46.0)
Hemoglobin: 7.4 g/dL — ABNORMAL LOW (ref 12.0–15.0)
Immature Granulocytes: 7 %
Lymphocytes Relative: 14 %
Lymphs Abs: 3.1 10*3/uL (ref 0.7–4.0)
MCH: 27.9 pg (ref 26.0–34.0)
MCHC: 29 g/dL — ABNORMAL LOW (ref 30.0–36.0)
MCV: 96.2 fL (ref 80.0–100.0)
Monocytes Absolute: 1.5 10*3/uL — ABNORMAL HIGH (ref 0.1–1.0)
Monocytes Relative: 7 %
Neutro Abs: 16.5 10*3/uL — ABNORMAL HIGH (ref 1.7–7.7)
Neutrophils Relative %: 70 %
Platelets: 224 10*3/uL (ref 150–400)
RBC: 2.65 MIL/uL — ABNORMAL LOW (ref 3.87–5.11)
RDW: 16.2 % — ABNORMAL HIGH (ref 11.5–15.5)
WBC: 23.1 10*3/uL — ABNORMAL HIGH (ref 4.0–10.5)
nRBC: 6.1 % — ABNORMAL HIGH (ref 0.0–0.2)

## 2020-10-31 LAB — HEPARIN LEVEL (UNFRACTIONATED): Heparin Unfractionated: 0.54 IU/mL (ref 0.30–0.70)

## 2020-10-31 LAB — GLUCOSE, CAPILLARY
Glucose-Capillary: 190 mg/dL — ABNORMAL HIGH (ref 70–99)
Glucose-Capillary: 159 mg/dL — ABNORMAL HIGH (ref 70–99)
Glucose-Capillary: 304 mg/dL — ABNORMAL HIGH (ref 70–99)
Glucose-Capillary: 494 mg/dL — ABNORMAL HIGH (ref 70–99)
Glucose-Capillary: >600 mg/dL (ref 70–99)
Glucose-Capillary: 286 mg/dL — ABNORMAL HIGH (ref 70–99)
Glucose-Capillary: 149 mg/dL — ABNORMAL HIGH (ref 70–99)
Glucose-Capillary: 241 mg/dL — ABNORMAL HIGH (ref 70–99)

## 2020-10-31 LAB — MAGNESIUM: Magnesium: 2.5 mg/dL — ABNORMAL HIGH (ref 1.7–2.4)

## 2020-10-31 MED ORDER — FENTANYL 50 MCG/HR TD PT72
1.0000 | MEDICATED_PATCH | TRANSDERMAL | Status: DC
  Administered 2020-10-31 – 2020-11-21 (×8): 1 via TRANSDERMAL
  Filled 2020-10-31 (×10): qty 1

## 2020-10-31 MED ORDER — INSULIN ASPART 100 UNIT/ML ~~LOC~~ SOLN
12.0000 [IU] | SUBCUTANEOUS | Status: DC
  Administered 2020-10-31 – 2020-11-02 (×12): 12 [IU] via SUBCUTANEOUS

## 2020-10-31 MED ORDER — ALBUMIN HUMAN 5 % IV SOLN
12.5000 g | Freq: Once | INTRAVENOUS | Status: AC
  Administered 2020-11-01: 12.5 g via INTRAVENOUS
  Filled 2020-10-31: qty 250

## 2020-10-31 MED ORDER — SODIUM CHLORIDE 0.9 % IV SOLN
200.0000 mg | Freq: Once | INTRAVENOUS | Status: AC
  Administered 2020-10-31: 200 mg via INTRAVENOUS
  Filled 2020-10-31: qty 200

## 2020-10-31 MED ORDER — LINEZOLID 600 MG/300ML IV SOLN
600.0000 mg | Freq: Two times a day (BID) | INTRAVENOUS | Status: DC
  Administered 2020-10-31 – 2020-11-01 (×4): 600 mg via INTRAVENOUS
  Filled 2020-10-31 (×5): qty 300

## 2020-10-31 MED ORDER — SODIUM CHLORIDE 0.9 % IV SOLN
1.0000 g | Freq: Three times a day (TID) | INTRAVENOUS | Status: DC
  Administered 2020-10-31 – 2020-11-03 (×10): 1 g via INTRAVENOUS
  Filled 2020-10-31 (×12): qty 1

## 2020-10-31 MED ORDER — SODIUM CHLORIDE 0.9 % IV SOLN
100.0000 mg | INTRAVENOUS | Status: DC
  Administered 2020-11-01: 100 mg via INTRAVENOUS
  Filled 2020-10-31: qty 100

## 2020-10-31 MED ORDER — DEXMEDETOMIDINE HCL IN NACL 400 MCG/100ML IV SOLN
0.0000 ug/kg/h | INTRAVENOUS | Status: DC
  Administered 2020-10-31: 0.4 ug/kg/h via INTRAVENOUS
  Administered 2020-11-01: 1.2 ug/kg/h via INTRAVENOUS
  Administered 2020-11-01: 0.5 ug/kg/h via INTRAVENOUS
  Administered 2020-11-01 (×2): 1 ug/kg/h via INTRAVENOUS
  Administered 2020-11-02 (×2): 0.8 ug/kg/h via INTRAVENOUS
  Administered 2020-11-02 – 2020-11-03 (×2): 1 ug/kg/h via INTRAVENOUS
  Administered 2020-11-03: 20:00:00 1.2 ug/kg/h via INTRAVENOUS
  Administered 2020-11-03 (×2): 0.7 ug/kg/h via INTRAVENOUS
  Administered 2020-11-04 – 2020-11-05 (×7): 1.2 ug/kg/h via INTRAVENOUS
  Administered 2020-11-05: 23:00:00 1 ug/kg/h via INTRAVENOUS
  Administered 2020-11-05: 18:00:00 1.1 ug/kg/h via INTRAVENOUS
  Administered 2020-11-05: 09:00:00 1.2 ug/kg/h via INTRAVENOUS
  Administered 2020-11-05: 13:00:00 1.1 ug/kg/h via INTRAVENOUS
  Administered 2020-11-06 (×2): 0.9 ug/kg/h via INTRAVENOUS
  Administered 2020-11-06: 0.8 ug/kg/h via INTRAVENOUS
  Administered 2020-11-06: 05:00:00 0.9 ug/kg/h via INTRAVENOUS
  Administered 2020-11-07: 0.6 ug/kg/h via INTRAVENOUS
  Administered 2020-11-07: 13:00:00 0.7 ug/kg/h via INTRAVENOUS
  Administered 2020-11-07 – 2020-11-08 (×2): 0.8 ug/kg/h via INTRAVENOUS
  Administered 2020-11-08: 0.4 ug/kg/h via INTRAVENOUS
  Administered 2020-11-09: 0.7 ug/kg/h via INTRAVENOUS
  Administered 2020-11-09: 0.6 ug/kg/h via INTRAVENOUS
  Administered 2020-11-09 – 2020-11-10 (×3): 0.7 ug/kg/h via INTRAVENOUS
  Filled 2020-10-31 (×14): qty 100
  Filled 2020-10-31: qty 200
  Filled 2020-10-31 (×19): qty 100

## 2020-10-31 NOTE — Progress Notes (Signed)
NAMELexia Vandevender, MRN:  [id number], DOB:  1951-08-03, LOS: 49 ADMISSION DATE:  08/31/2020, CONSULTATION DATE:  09/07/2020 REFERRING MD:  Dr Candace [name], CHIEF COMPLAINT:  Acute resp failure  Brief History   70 year old female who was previously diagnosed with Covid 08/23/2020.  Admitted 11/2 with AF-RVR, found to have a perforated duodenal underwent exploratory laparotomy with Phillip Heal patch placement.  Past Medical History  Covid pneumonia Atrial fibrillation CKD stage III Diabetes mellitus Hypertension Colon cancer Hyperlipidemia  Significant Hospital Events   11/2 Admitted  11/3 OR with findings of perforated duodenal ulcer 11/10 progressive hemorrhagic shock, intubated, transfused, pressors, proned; started on CRRT in PM 11/03 Exploratory laparotomy, Phillip Heal patch, lysis of adhesion for duodenal ulceration postop day 6 11/16 Extubated. Re-intubated overnight due to respiratory distress and hypoxia with decreased mentation 11/18 Bronch, cultures sent 11/19 Hgb down getting blood 11/24 Spiked fever resume empirical antimicrobial therapy 11/26 Hemorrhagic shock, hgb 5.6, increased pressors, CT A/P  11/30 Per palliative "Candace [name] expresses understanding that patient is unfortunately very fragile despite ongoing intensive medical treatment and full mechanical support. She indicates that the family wants to continue with all current interventions despite potential outcomes". 12/08 CRRT discontinued due to clotting 12/09 Family requested transfer to tertiary care [facility]). Candace [name] denied transfer  12/10 CRRT restarted. Episodes of tachycardia, tachypnea that seem to improve with pain management 12/11 Back in shock. Pressor requirements up. CXR worse. ABX resumed 12/12 Still requiring inc pressors. Had hypoglycemic event. Basal insulin dosing adjusted 12/13 Pressor requirements better. Now hyperglycemic. Re-adjusted Glycemic control  12/14 Changed dilaudid to1/2 dosing from day further. D/c  vasopressin.  Goals of care reconfirmed with daughter.  Patient continues to desire aggressive care.  Not open to discussing any other option, patient family continues to be hopeful that she will be discharged to home with full recovery in spite of multiple attempts by staff to prepare them that this is unlikely scenario 12/15 Dilaudid discontinued sending cortisol for ongoing pressor dependence 12/16 Serum cortisol <20, added stress dose steroids.  PRN Dilaudid, attempting not go back on Dilaudid infusion 12/17 Developed worsening tachycardia during the evening hours received initially IV albumin, followed by resuming IV Dilaudid with question of suboptimal pain control.  Currently looks better back on Dilaudid drip.  We have been able to wean pressors after adding stress dose steroids; near arrest - bradycardia, better with atropine  12/19 Afebrile . Remains on dilaudid and heparin gtt, dilaudid gtt increased overnight for concern of pain / ongoing tachycardia, no other events . NE and precedex off 12/17. Ongoing CRRT- even UF, dosing lokelmia this morning 12/20 On CRRT.  Renal plans for HD cath and moving to HD. Getting wound care  12/23 -No vent weaning per RT, remains on full support, #8 trach in place but previously had #6, no documentation of change noted in chart Afebrile / WBC 20.4  Vent - 30% FiO2, PEEP 5 Glucose range 176-212 I/O 465 ml stool, 3.6L removed with HD, -1.1L in last 24 hours  RN reports ongoing periods of tachypnea / vent dyssynchrony that responds to dilaudid   12/24 - renal stopping CRRT today and plans HD 10/24/20 . 40% fio2 on vent via Trach. TAchypenic and tachycardic. Afebrile but wbc up to 27.6K. On TF. Onn heparin gtt  12/25 - Back on CRRT. On vent via trach at 40% fio2, On scheduled dilaudid as add on to oxy. Per family request 12/24 - they felt scheduled oxy was not adequate and patien was showing signs  of opioid withdrawal.  Patient also had  worsening SIRS/sepsis  syndrome. Had fever, rising wbc, worsening RR 40-60 and HR 140s sinsu-> strated On abx yesterday. Fever some better today. WBC plateau at 28,.5K. On new levophed -> signifanct improvement in HR 77 and RR t0 20. On heparin gtt.  On precedex gtt. On levophed gtt 60mcg wthi midodrine. On TF 12/30 Remains on CRRT with intermittent pressor requirements. Ethics consult placed evening of 12/29. Ethics rec time trial of CRRT 12/31 failed SBT with RR 40s. Several conversations between care tam and family, who are upset RE plan of care 1/1 back on pressors   Consults:  Cardiology [facility] Nephrology  Ethics  Procedures:  R PICC 11/5 >> A line 11/9 >> out ETT 11/9 > 11/16, 11/16 >> 09/21/2020, 09/21/2020 tracheostomy>> Lt [place] CVL 11/9 >> R IJ trialysis >> out HD catheter 12/1 >>12/20 12/21 - 14.5 Fr, 23 cm right IJ tunneled hemodialysis catheter placement.  Removal of indwelling subclavian catheter.   Significant Diagnostic Tests:  11/3 CT abd/ pelvis > 1. Positive for bowel perforation: Pneumoperitoneum and intermediate density free fluid in the abdomen. Prior total colectomy. The specific site of perforation is unclear-oral contrast present to the proximal jejunum has not obviously leaked. Note that there may be small bowel loops adherent to the ventral abdominal wall along the greater curve of the stomach. 2. Extensive bilateral lower lung pneumonia. No pleural effusion. 3. Other abdominal and pelvic viscera are stable since 2015, including bilateral adrenal adenomas. Chronic renal parapelvic cysts. 4. Aortic Atherosclerosis 11/3 TTE > EF 70-75%, RV not well visualized, mildly reduced RV systolic function 40/81 CT chest/ abd/ pelvis> 1. Interval progression of diffuse bilateral hazy ground-glass airspace opacities with more focal areas of consolidation at the lung bases 2. Trace bilateral pleural effusions. 3. Postsurgical changes the abdomen as detailed above. No evidence for a postoperative  abscess, however evaluation is limited by lack of IV contrast. 4. There is a 1.9 cm cystic appearing lesion located in the pancreatic body. This was not present on the patient's CT from 2015.  Follow-up with an outpatient contrast enhanced MRI is recommended. 5. The endometrial stripe appears diffusely thickened. Follow-up with pelvic ultrasound is recommended. Aortic Atherosclerosis 11/14 LE doppler studies > + DVT of right posterior tibial and peroneal vein, +dVT of left posterior tibial vein  11/26 CT ABD PEL > liver unremarkable, distended gallbladder with layering tiny gallstones versus sludge, no duct dilatation, mild hyperdensity right upper pole renal collecting system new.  No evidence of retroperitoneal bleeding.  Multifocal lower lobe predominant pulmonary infiltrates/pneumonia.  Small left pleural effusion.  Micro Data:  11/10 MRSA PCR > neg 11/10 BC x 2 > neg 09/22/2020 blood cultures x2>> S epi.  09/22/2020 sputum culture>> MRSA Blood 12/1 >> negative. S.epi 1 out of 2, likely contaminant BCx 2 12/12 >> neg xxx Trach aspirate 12/24 - FEW STAPHYLOCOCCUS AUREUS  Blood 12/24 > Negative  Blood 1/2: Sputum 1/2:  Antimicrobials:  azithro 11/2 >11/3 Ceftriaxone 11/2  Fluconazole 11/3 Zosyn 11/3 >> 11/7 Vanc 11/10 off Cefepime 11/10 > 11/16 09/22/2020 vancomycin for MRSA PNA >> 12/2 xxxx Vanc 12/11> 12/17 Zosyn 12/11> 12/17 xxxx vanc 12/24 (staph resp cutlure) >> 12/28 Zosyn 12/24 - 12/27 Cefazolin 12/27 >>1/1 linezolid 1/2 merrem 1/2 eraxis 1/2   Subjective:   Wbc cont to climb. Remains on pressors. Afebrile but on crrt. Sending cx. Broaden abx, consider ID consult on Monday. Will remain on crrt until clearly defined no new infection  Objective   Blood pressure (!) 88/59, pulse (!) 106, temperature (!) 96.7 F (35.9 C), temperature source Axillary, resp. rate (!) 35, height 5\' 8"  (1.727 m), weight 76.2 kg, SpO2 100 %.    Vent Mode: PRVC FiO2 (%):  [40 %] 40  % Set Rate:  [28 bmp] 28 bmp Vt Set:  [450 mL] 450 mL PEEP:  [5 cmH20] 5 cmH20 Plateau Pressure:  [22 cmH20-26 cmH20] 26 cmH20   Intake/Output Summary (Last 24 hours) at 10/31/2020 0902 Last data filed at 10/31/2020 0800 Gross per 24 hour  Intake 3779.69 ml  Output 3395 ml  Net 384.69 ml   Filed Weights   10/29/20 0500 10/30/20 0500 10/31/20 0429  Weight: 77.9 kg 75.2 kg 76.2 kg   Physical Exam  General: Chronically and critically ill debilitated F, reclined in bed NAD  HEENT: trach secure. Anicteric sclera. Pink mm.  Neuro: Opens eyes, following simple commands, flicker bilat feet. Can lift R hand off bed not wrist, can life L fingers off bed not hand.  CV: s1s2 no rgm cap refill < 3 seconds  PULM:  Bilateral rhonchi, remains vent dependent with mild tachypnea  GI: soft round ndnt.  Extremities: Decreased muscle tone BUE BLE. BLE boots.  Skin: c/d/w. Sacral wound.   Resolved problems:  Previously hemorrhagic shock requiring PRBCs, Septic shock, MRSA pneumonia s/p 8 days abx Complicated by MRSA healthcare associated pneumonia (treated) Peritonitis and perforated ulcer post repair 09/01/20  Assessment:   Acute encephalopathy Prolonged critical illness, sedation/pain -will try dc precedex 1/2 -mental status clearly improving -dilaudid, change to fentanyl patch, seroquel, clonazepam   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 status post tracheostomy VDRF -VAP, pulm hygiene -continue vent wean efforts -CXR 1/2: with mildly improved infiltrates   New MSSA HCAP vs bronchitis 12/24 Has had multiple rounds of abx for multiple VAP's this admission -Ancef -pct remains elevated but lower than previous will trend -lactate normal -wbc rising.  -send repeat cx yet again -broaden abx and include antifungal until fungitell back. -really would benefit from line holiday but due to crrt and medication requirement this is not possible for now.     Persistent vasoplegia  -over 50 days -- big picture, long term pressors is not sustainable  -on max dose midodrine -PRN pressors -check cortisol normal (no indication for stress steroids) -fungitell sent 1/1   Renal failure on CRRT  -over 50 days-- big picture, long term crrt not sustainable -Nephro following -appreciate ethics.  -have personally had multiple conversations with daughter that the vent is not the issue for life sustaining the repeat infections and inability to wean from crrt is.    Afib/DVT - heparin drip  Critical Illness Myopathy Severe deconditioning  Inadequate PO intake  - PT following - EN per RDN  DM2 with hyperglycemia -changing insulin reg for better control  Stage IV sacral ulcer -WOC, hydrotherapy, dakins   History of stage IV colon cancer 1.9 cm cystic appearing lesion located in the pancreatic body  GOC -multiple GOC conversations with various PCCM providers and family have not yielded changes in scope of tx, multiple PCCM providers have been fired by family.  -concerns for futility of certain interventions -ethics consulted 12/30-- recs for time limited trial of CRRT, please see separate Ethics consult note for full details. Futility policy reviewed with family by ethics.    Best practice:  Diet: EN  Pain/Anxiety/Delirium protocol (if indicated): titrating.  VAP protocol (if indicated): yes DVT  prophylaxis: IV heparin GI prophylaxis: Protonix Glucose control: SSI, TF coverage, lantus Mobility: PT Family communication: updated 11/01/19 by Dr. Ruthann Cancer  Critical care time: The patient is critically ill with multiple organ systems failure and requires high complexity decision making for assessment and support, frequent evaluation and titration of therapies, application of advanced monitoring technologies and extensive interpretation of multiple databases.  Critical care time 55 mins. This represents my time independent of the NPs time  taking care of the pt. This is excluding procedures.    Candace Nine DO [facility] 10/31/2020, 9:02 AM

## 2020-10-31 NOTE — Progress Notes (Signed)
ANTICOAGULATION CONSULT NOTE  Pharmacy Consult:  Heparin Indication: atrial fibrillation   Labs: Recent Labs    10/29/20 0455 10/29/20 1607 10/30/20 0500 10/30/20 0724 10/30/20 1608 10/31/20 0643  HGB 7.2*  --  7.5*  --   --   --   HCT 24.4*  --  26.5*  --   --   --   PLT 237  --  248  --   --   --   HEPARINUNFRC 0.37  --  >2.20* 0.32  --  0.54  CREATININE 0.63 0.58 0.57  --  0.67  --     Assessment: 70 yo female with history of Afib on apixaban prior to admission, which has been held and patient transitioned to IV heparin.   Today the heparin level remains therapeuticon current rate of 1300 units/hr (13 mL/hr). There has been no further bleeding per RN and her hemoglobin remains low-stable with normal platelet count.  Goal of Therapy:  Heparin level 0.3-0.5 units/ml   Plan:  Continue heparin gtt at 1300 units/hr Daily heparin level and CBC Monitor for bleeding  Barth Kirks, PharmD, BCPS, BCCCP Clinical Pharmacist [phone]  Please check AMION for all [place] [facility] numbers  10/31/2020 7:35 AM

## 2020-10-31 NOTE — Progress Notes (Addendum)
eLink Physician-Brief Progress Note Patient Name: Candace [name] DOB: 10-30-1951 MRN: [id number]   Date of Service  10/31/2020  HPI/Events of Note  Order for precedex expired and patient still on it. Notes reviewed with plan to discontinue precedex, is on fentanyl patch and clonazepam. RN has lowered dose and hopefully can continue to lower and stop in next few hours.   eICU Interventions  Order placed     Intervention Category Major Interventions: Delirium, psychosis, severe agitation - evaluation and management  Janelly Switalski G Jori Frerichs 10/31/2020, 8:01 PM   11:50 pm Hypotension MAP 63 with tachycardia Has been on pressors and on CRRT Albumin given last night around this time helped Will try a dose  Overall not doing well .

## 2020-10-31 NOTE — Progress Notes (Signed)
[facility] PROGRESS NOTE  Assessment/ Plan:  # AKI- oliguric now likely anuric. ATN for shock.Started on CRRT 09/08/20 due to persistent oliguria/anuria and hyperkalemia.Ravensworth placed on 10/19/20. -All fluids 4K/2.5Ca: Pre-filter to 500 ml/hr, post-filter 200 ml/hr, and dialysate to 1000 ml/hr. No UF for now. No anticoagulation due to thrombocytopenia and bleeding -Patient has failed multiple transitions to HD w/ low Bps requiring pressors -No signs of meaningful recovery from our team -Family expressed ongoing wishes for aggressive care -Ethic seen 12/30 and suggested time trial of 5-7 days CV while finishing treatment for PNA -Had planned for no restart today and HD on 1/3 but patient more unstable. Discussion with daughter as below. Will plan to try this week. -If she fails HD then would implement futility policy as outline by ethics and not restart HD or CRRT  # Hemorrhagic shock- ongoing blood loss and requiring pressors intermittently. Transfuse as needed, have added ESA.  Hemoglobin stable today  # Septic shock due to MRSA pneumonia.  Levophed IV as needed  # Perforated duodenal ulcer- s/p exploratory lap and Graham patch placement per surgery.  # Acute hypoxic/hypercapnic respiratory failure due to ARDS from Recent Covid-19 PNA- currently on vent via trach per PCCM. low o2.  # P. Atrial fibrillation- no anticoagulation due to GI bleed.  # Hyponatremia- worsened quickly off of CRRT-  manage with crrt.   # Severe protein malnutrition- per primary svc.  # Disposition- poor overall prognosis and will likely require LTAC and chronic vent support if we can ever get her transitioned to IHD.  This is unlikely at this time.   Subjective: Seen and examined in ICU. Not able to interact with me today.   More unstable over the past 24 hours. UF stopped due to hypotension. Back on norepi.  I discussed care with Mrs. Loanne Drilling today. She understands the difficult  situation. She wants to give Mrs. Paolo the best chance at HD because we fully stop CRRT. This was echoed in the Ethics consult and was why we made a time trial plan for ~5 days of abx before the transition. Unfortunately her regression yesterday has affected our plan. Mrs. Loanne Drilling and I discussed delaying Mrs. Viernes's transition to HD for now until she stabilizes further. I voiced that at some point we will have to try to switch and Mrs. Loanne Drilling agreed and state she fully understood.  I voiced that because we are frequently batteling bouts of instability from reflection, or other causes, we may have to shorten our accepted stability time from 5 days to around 2 days. She also understood this.   Objective Vital signs in last 24 hours: Vitals:   10/31/20 0700 10/31/20 0715 10/31/20 0730 10/31/20 0807  BP: 96/69 102/68 (!) 92/56   Pulse:      Resp: (!) 27 (!) 29 (!) 33   Temp:      TempSrc:      SpO2:   100% 99%  Weight:      Height:       Weight change: 1 kg  Intake/Output Summary (Last 24 hours) at 10/31/2020 0826 Last data filed at 10/31/2020 0700 Gross per 24 hour  Intake 3774.42 ml  Output 3387 ml  Net 387.42 ml       Labs: Basic Metabolic Panel: Recent Labs  Lab 10/30/20 0500 10/30/20 1608 10/31/20 0653  NA 133* 132* 134*  K 4.5 5.1 5.0  CL 98 100 98  CO2 _0 GLUCOSE 215* 272*  214*  BUN 34* 33* 29*  CREATININE 0.57 0.67 0.58  CALCIUM 10.5* 10.4* 10.8*  PHOS 3.3 4.5 3.5   Liver Function Tests: Recent Labs  Lab 10/30/20 0500 10/30/20 1608 10/31/20 0653  ALBUMIN 2.2* 2.1* 2.6*   No results for input(s): LIPASE, AMYLASE in the last 168 hours. No results for input(s): AMMONIA in the last 168 hours. CBC: Recent Labs  Lab 10/27/20 0452 10/28/20 0341 10/29/20 0455 10/30/20 0500 10/31/20 0653  WBC 23.1* 16.0* 16.0* 23.1* 25.5*  HGB 7.2* 7.2* 7.2* 7.5* 7.4*  HCT 23.7* 23.4* 24.4* 26.5* 24.3*  MCV 94.4 94.0 96.4 97.8 96.0  PLT 280 273 237 248 240    Cardiac Enzymes: No results for input(s): CKTOTAL, CKMB, CKMBINDEX, TROPONINI in the last 168 hours. CBG: Recent Labs  Lab 10/30/20 1155 10/30/20 1600 10/30/20 1942 10/30/20 2336 10/31/20 0348  GLUCAP 192* 218* 236* 227* 190*    Iron Studies: No results for input(s): IRON, TIBC, TRANSFERRIN, FERRITIN in the last 72 hours. Studies/Results: DG CHEST PORT 1 VIEW  Result Date: 10/31/2020 CLINICAL DATA:  70 year old female with respiratory failure. COVID-19. EXAM: PORTABLE CHEST 1 VIEW COMPARISON:  Portable chest 10/27/2020 and earlier. FINDINGS: Portable AP semi upright view at 0525 hours. Stable tracheostomy. Stable dual lumen right chest catheter. Enteric feeding tube courses to the abdomen, tip not included. Larger lung volumes. Patchy, reticulonodular opacity in the right mid lung and at both lung bases persists, but less confluent opacity at the bases now. Mildly improved ventilation. No pneumothorax, pulmonary edema or definite effusion. Mediastinal contours remain normal. Paucity of bowel gas in the upper abdomen. Stable visualized osseous structures. IMPRESSION: 1.  Stable lines and tubes. 2. Mild improvement in bilateral lower lung opacities since 10/27/2020. Electronically Signed   By: Genevie Ann M.D.   On: 10/31/2020 07:46    Medications: Infusions: .  prismasol BGK 4/2.5 500 mL/hr at 10/31/20 0723  .  prismasol BGK 4/2.5 200 mL/hr at 10/31/20 0020  . sodium chloride 10 mL/hr at 10/31/20 0700  . sodium chloride    . dexmedetomidine (PRECEDEX) IV infusion 0.4 mcg/kg/hr (10/31/20 0700)  . feeding supplement (VITAL 1.5 CAL) 1,000 mL (10/31/20 0155)  . heparin 1,300 Units/hr (10/31/20 0700)  . norepinephrine (LEVOPHED) Adult infusion 4 mcg/min (10/31/20 0700)  . prismasol BGK 4/2.5 1,000 mL/hr at 10/31/20 9562    Scheduled Medications: . B-complex with vitamin C  1 tablet Per Tube Daily  . chlorhexidine gluconate (MEDLINE KIT)  15 mL Mouth Rinse BID  . Chlorhexidine Gluconate  Cloth  6 each Topical Daily  . cholecalciferol  2,000 Units Per Tube Daily  . clonazepam  1 mg Per Tube TID  . collagenase   Topical Daily  . darbepoetin (ARANESP) injection - DIALYSIS  100 mcg Subcutaneous Q Tue-1800  . docusate  100 mg Per Tube BID  . feeding supplement (PROSource TF)  90 mL Per Tube TID  . guaiFENesin  15 mL Per Tube Q12H  .  HYDROmorphone (DILAUDID) injection  1 mg Intravenous Q4H  . influenza vaccine adjuvanted  0.5 mL Intramuscular Tomorrow-1000  . insulin aspart  0-15 Units Subcutaneous Q4H  . insulin aspart  8 Units Subcutaneous Q4H  . insulin glargine  45 Units Subcutaneous Daily  . mouth rinse  15 mL Mouth Rinse 10 times per day  . midodrine  40 mg Per Tube TID WC  . nutrition supplement (JUVEN)  1 packet Per Tube BID BM  . pantoprazole sodium  40 mg Per Tube  BID  . polyethylene glycol  17 g Per Tube Daily  . QUEtiapine  100 mg Per Tube BID  . sodium chloride flush  10-40 mL Intracatheter Q12H    have reviewed scheduled and prn medications.  Physical Exam:  General: Critically ill appearing, eyes open, no interaction Heart:tachycardia, no audible murmur Lungs: Coarse breath sound bilateral, bilateral chest rise Abdomen:soft,  non-distended Extremities:trace edema in BLE Dialysis Access: Right IJ TDC  Shaune Pollack Fatina Sprankle 10/31/2020,8:26 AM  LOS: 60 days

## 2020-10-31 NOTE — Procedures (Signed)
Patient was seen and examined on CRRT. More unstable over past 24 hours. No uf. Continue CRRT today.

## 2020-11-01 ENCOUNTER — Inpatient Hospital Stay (HOSPITAL_COMMUNITY): Payer: Medicare Other

## 2020-11-01 DIAGNOSIS — R579 Shock, unspecified: Secondary | ICD-10-CM | POA: Diagnosis not present

## 2020-11-01 DIAGNOSIS — J80 Acute respiratory distress syndrome: Secondary | ICD-10-CM | POA: Diagnosis not present

## 2020-11-01 DIAGNOSIS — I4891 Unspecified atrial fibrillation: Secondary | ICD-10-CM | POA: Diagnosis not present

## 2020-11-01 DIAGNOSIS — J9621 Acute and chronic respiratory failure with hypoxia: Secondary | ICD-10-CM | POA: Diagnosis not present

## 2020-11-01 LAB — CBC
HCT: 23.3 % — ABNORMAL LOW (ref 36.0–46.0)
Hemoglobin: 7.1 g/dL — ABNORMAL LOW (ref 12.0–15.0)
MCH: 29.2 pg (ref 26.0–34.0)
MCHC: 30.5 g/dL (ref 30.0–36.0)
MCV: 95.9 fL (ref 80.0–100.0)
Platelets: 234 10*3/uL (ref 150–400)
RBC: 2.43 MIL/uL — ABNORMAL LOW (ref 3.87–5.11)
RDW: 16.2 % — ABNORMAL HIGH (ref 11.5–15.5)
WBC: 33.4 10*3/uL — ABNORMAL HIGH (ref 4.0–10.5)
nRBC: 9.2 % — ABNORMAL HIGH (ref 0.0–0.2)

## 2020-11-01 LAB — BLOOD GAS, VENOUS
Acid-base deficit: 3 mmol/L — ABNORMAL HIGH (ref 0.0–2.0)
Bicarbonate: 22.7 mmol/L (ref 20.0–28.0)
FIO2: 60
O2 Saturation: 70.8 %
Patient temperature: 37
pCO2, Ven: 49.5 mmHg (ref 44.0–60.0)
pH, Ven: 7.283 (ref 7.250–7.430)
pO2, Ven: 43.5 mmHg (ref 32.0–45.0)

## 2020-11-01 LAB — RENAL FUNCTION PANEL
Albumin: 2.7 g/dL — ABNORMAL LOW (ref 3.5–5.0)
Albumin: 2.3 g/dL — ABNORMAL LOW (ref 3.5–5.0)
Anion gap: 10 (ref 5–15)
Anion gap: 9 (ref 5–15)
BUN: 33 mg/dL — ABNORMAL HIGH (ref 8–23)
BUN: 45 mg/dL — ABNORMAL HIGH (ref 8–23)
CO2: 22 mmol/L (ref 22–32)
CO2: 22 mmol/L (ref 22–32)
Calcium: 10.6 mg/dL — ABNORMAL HIGH (ref 8.9–10.3)
Calcium: 9.8 mg/dL (ref 8.9–10.3)
Chloride: 100 mmol/L (ref 98–111)
Chloride: 99 mmol/L (ref 98–111)
Creatinine, Ser: 0.52 mg/dL (ref 0.44–1.00)
Creatinine, Ser: 0.56 mg/dL (ref 0.44–1.00)
GFR, Estimated: >60 mL/min (ref >60)
GFR, Estimated: >60 mL/min (ref >60)
Glucose, Bld: 270 mg/dL — ABNORMAL HIGH (ref 70–99)
Glucose, Bld: 251 mg/dL — ABNORMAL HIGH (ref 70–99)
Phosphorus: 3.6 mg/dL (ref 2.5–4.6)
Phosphorus: 3.6 mg/dL (ref 2.5–4.6)
Potassium: 5 mmol/L (ref 3.5–5.1)
Potassium: 4.6 mmol/L (ref 3.5–5.1)
Sodium: 132 mmol/L — ABNORMAL LOW (ref 135–145)
Sodium: 130 mmol/L — ABNORMAL LOW (ref 135–145)

## 2020-11-01 LAB — MAGNESIUM: Magnesium: 2.4 mg/dL (ref 1.7–2.4)

## 2020-11-01 LAB — GLUCOSE, CAPILLARY
Glucose-Capillary: 264 mg/dL — ABNORMAL HIGH (ref 70–99)
Glucose-Capillary: 135 mg/dL — ABNORMAL HIGH (ref 70–99)
Glucose-Capillary: 558 mg/dL (ref 70–99)
Glucose-Capillary: 190 mg/dL — ABNORMAL HIGH (ref 70–99)
Glucose-Capillary: 267 mg/dL — ABNORMAL HIGH (ref 70–99)
Glucose-Capillary: 239 mg/dL — ABNORMAL HIGH (ref 70–99)
Glucose-Capillary: 328 mg/dL — ABNORMAL HIGH (ref 70–99)

## 2020-11-01 LAB — HEPARIN LEVEL (UNFRACTIONATED): Heparin Unfractionated: 0.45 IU/mL (ref 0.30–0.70)

## 2020-11-01 LAB — PROCALCITONIN: Procalcitonin: 3.88 ng/mL

## 2020-11-01 NOTE — Progress Notes (Addendum)
PT Cancellation Note  Patient Details Name: Candace [name] MRN: [id number] DOB: 1951/09/06   Cancelled Treatment:    Reason Eval/Treat Not Completed: Medical issues which prohibited therapy. Discussed pt case with RN who asked that hydrotherapy hold at this time. RN asked that hydrotherapy check back this afternoon for appropriateness for treatment.   Thelma Comp 11/01/2020, 11:47 AM   Rolinda Roan, PT, DPT Acute Rehabilitation Services Pager: [phone] Office: [phone]

## 2020-11-01 NOTE — Progress Notes (Signed)
PT Cancellation Note  Patient Details Name: Candace [name] MRN: [id number] DOB: 04-Dec-1950   Cancelled Treatment:    Reason Eval/Treat Not Completed: Medical issues which prohibited therapy. Checked in with RN again who reports that pt is still too unstable for hydrotherapy treatment. Will hold for the rest of the afternoon and check back tomorrow for medical readiness.    Thelma Comp 11/01/2020, 1:01 PM   Rolinda Roan, PT, DPT Acute Rehabilitation Services Pager: [phone] Office: [phone]

## 2020-11-01 NOTE — Consult Note (Signed)
Ney Nurse wound follow up Patient receiving care in [facility] 2M7.  Primary RN present in room at time of my assessment. There remains adipose tissue in the wound bed. I did not detect any odor. I do not see any unexpected or highly discolored drainage on the existing dressing.  I think continuing with hydrotherapy and bid saline moistened gauze remains an appropriate approach to cleaning up this wound.  See PT hydrotherapy note from today for full wound characteristic details. Val Riles, RN, MSN, CWOCN, CNS-BC, pager [phone]

## 2020-11-01 NOTE — Progress Notes (Addendum)
Have updated daughter at beside and Roland Rack that Dr. Shearon Stalls and Dr. Hulen Skains have time available tomorrow between 12:30 and 3 to discuss any concerns. [name] expresses that she will touch base with siblings before she leaves tonight and convey what time will work.

## 2020-11-01 NOTE — Progress Notes (Signed)
ANTICOAGULATION CONSULT NOTE  Pharmacy Consult:  Heparin Indication: atrial fibrillation   Labs: Recent Labs    10/30/20 0724 10/30/20 1608 10/31/20 0643 10/31/20 0653 10/31/20 1018 10/31/20 1710 11/01/20 0330  HGB  --   --   --  7.4* 7.4*  --  7.1*  HCT  --   --   --  24.3* 25.5*  --  23.3*  PLT  --   --   --  240 224  --  234  HEPARINUNFRC 0.32  --  0.54  --   --   --  0.45  CREATININE  --    < >  --  0.58  --  0.57 0.52   < > = values in this interval not displayed.    Assessment: 70 yo female with history of Afib on apixaban prior to admission, which has been held and patient transitioned to IV heparin.   Heparin level remains therapeutic and stable on 1300 units/hr. There has been no further bleeding per RN and her hemoglobin remains low-stable with normal platelet count.  Goal of Therapy:  Heparin level 0.3-0.5 units/ml   Plan:  Continue heparin gtt at 1300 units/hr Daily heparin level and CBC Monitor for bleeding  Jarica Plass D. Mina Marble, PharmD, BCPS, [place] 11/01/2020, 11:01 AM

## 2020-11-01 NOTE — Progress Notes (Incomplete)
Pt appearing less agitated after 1400; cont observation and gently turning and care to minimize noxious stimulation. 1700 pt appeared optimal to change sacral wound dsg (PT had been unable to do hydrotherapy/drg change earlier d/t instability. Pt appeared to tolerate turn and dsg change well, but did become more tachypneic, tachycardic afterwards. PRNs given, have been unable to wean Precedex, and unable to pull fluid on CRRT as pressor is just below max rate. Will continue to monitor and adjust tx as tolerated.

## 2020-11-01 NOTE — Progress Notes (Signed)
Pt became tachypneic, increased tachycardia at 0945. In past hour have adjusted sedation, given PRNs, repositioned, titrated Levo and FiO2. Dr. Shearon Stalls updated frequently and at bs. Await PCXR as pt remains in distress despite interventions. Continue to monitor closely.

## 2020-11-01 NOTE — Progress Notes (Signed)
Upon assessment at 2000 pts HR elevated to 130-140 (NSR) and RR in the 40-50s. Scheduled IV pain medicine given with no change in vital signs. Trach care and inner cannula changed with no change in RR.  After visiting hours ended and pts daugher left the pt was given a bath followed by night time medicines and PRN dose of pain medicine. Pt now resting comfortably with HR in the 110's and RR back to base line for this pt.

## 2020-11-01 NOTE — Progress Notes (Signed)
Assisted tele visit to patient with daughter. ° °Shanora Christensen P, RN  °

## 2020-11-01 NOTE — Progress Notes (Signed)
[facility] PROGRESS NOTE  Assessment/ Plan:  # AKI- oliguric now likely anuric. ATN for shock.Started on CRRT 09/08/20 due to persistent oliguria/anuria and hyperkalemia.[facility] placed on 10/19/20. -All fluids 4K/2.5Ca: Pre-filter to 500 ml/hr, post-filter 200 ml/hr, and dialysate to 1000 ml/hr. No UF for now. No anticoagulation due to thrombocytopenia and bleeding -Patient has failed multiple transitions to HD w/ low Bps requiring pressors -No signs of meaningful recovery from our team -Family expressed ongoing wishes for aggressive care -Ethics seen 12/30 and suggested time trial of 5-7 days CV while finishing treatment for PNA - hemodynamics preclude her transition to IHD at this time and I do not forsee improvement.     # Hemorrhagic shock- ongoing blood loss and requiring pressors intermittently. Transfuse as needed, have added ESA.  Hemoglobin stable today  # Septic shock due to MRSA pneumonia.  Levophed IV as needed  # Perforated duodenal ulcer- s/p exploratory lap and Graham patch placement per surgery.  # Acute hypoxic/hypercapnic respiratory failure due to ARDS from Recent Covid-19 PNA- currently on vent via trach per PCCM. low o2.  # P. Atrial fibrillation- no anticoagulation due to GI bleed.  # Hyponatremia- worsened quickly off of CRRT-  manage with crrt.   # Severe protein malnutrition- per primary svc.  # Disposition- poor overall prognosis  Subjective: Seen and examined in ICU.   HR in ths 120s-130s and back on norepi at 20 mcg. It appears that we are continuously backsliding.  Leukocytosis climbing again.    Objective Vital signs in last 24 hours: Vitals:   11/01/20 0945 11/01/20 1000 11/01/20 1015 11/01/20 1030  BP: (!) 68/14 (!) 61/24 (!) 69/44 (!) 78/25  Pulse: (!) 136 (!) 129 (!) 133 (!) 128  Resp: (!) 42 (!) 44 (!) 42 (!) 43  Temp:      TempSrc:      SpO2: 94% (!) 88% 100% 93%  Weight:      Height:       Weight change:  -1.1 kg  Intake/Output Summary (Last 24 hours) at 11/01/2020 1053 Last data filed at 11/01/2020 1000 Gross per 24 hour  Intake 4104.13 ml  Output 4430 ml  Net -325.87 ml       Labs: Basic Metabolic Panel: Recent Labs  Lab 10/31/20 0653 10/31/20 1710 11/01/20 0330  NA 134* 133* 132*  K 5.0 5.0 5.0  CL 98 101 100  CO2 _0 GLUCOSE 214* 240* 270*  BUN 29* 31* 33*  CREATININE 0.58 0.57 0.52  CALCIUM 10.8* 11.0* 10.6*  PHOS 3.5 3.5 3.6   Liver Function Tests: Recent Labs  Lab 10/31/20 0653 10/31/20 1710 11/01/20 0330  ALBUMIN 2.6* 2.5* 2.7*   No results for input(s): LIPASE, AMYLASE in the last 168 hours. No results for input(s): AMMONIA in the last 168 hours. CBC: Recent Labs  Lab 10/29/20 0455 10/30/20 0500 10/31/20 0653 10/31/20 1018 11/01/20 0330  WBC 16.0* 23.1* 25.5* 23.1* 33.4*  NEUTROABS  --   --   --  16.5*  --   HGB 7.2* 7.5* 7.4* 7.4* 7.1*  HCT 24.4* 26.5* 24.3* 25.5* 23.3*  MCV 96.4 97.8 96.0 96.2 95.9  PLT 237 248 240 224 234   Cardiac Enzymes: No results for input(s): CKTOTAL, CKMB, CKMBINDEX, TROPONINI in the last 168 hours. CBG: Recent Labs  Lab 10/31/20 1531 10/31/20 2004 10/31/20 2329 11/01/20 0338 11/01/20 0750  GLUCAP 286* 149* 241* 264* 135*    Iron Studies: No results for input(s): IRON,  TIBC, TRANSFERRIN, FERRITIN in the last 72 hours. Studies/Results: DG CHEST PORT 1 VIEW  Result Date: 10/31/2020 CLINICAL DATA:  70 year old female with respiratory failure. COVID-19. EXAM: PORTABLE CHEST 1 VIEW COMPARISON:  Portable chest 10/27/2020 and earlier. FINDINGS: Portable AP semi upright view at 0525 hours. Stable tracheostomy. Stable dual lumen right chest catheter. Enteric feeding tube courses to the abdomen, tip not included. Larger lung volumes. Patchy, reticulonodular opacity in the right mid lung and at both lung bases persists, but less confluent opacity at the bases now. Mildly improved ventilation. No pneumothorax, pulmonary  edema or definite effusion. Mediastinal contours remain normal. Paucity of bowel gas in the upper abdomen. Stable visualized osseous structures. IMPRESSION: 1.  Stable lines and tubes. 2. Mild improvement in bilateral lower lung opacities since 10/27/2020. Electronically Signed   By: Genevie Ann M.D.   On: 10/31/2020 07:46    Medications: Infusions: .  prismasol BGK 4/2.5 500 mL/hr at 11/01/20 0341  .  prismasol BGK 4/2.5 200 mL/hr at 11/01/20 0104  . sodium chloride 10 mL/hr at 11/01/20 1000  . sodium chloride    . anidulafungin    . dexmedetomidine (PRECEDEX) IV infusion 0.8 mcg/kg/hr (11/01/20 1000)  . feeding supplement (VITAL 1.5 CAL) 1,000 mL (10/31/20 2007)  . heparin 1,300 Units/hr (11/01/20 1000)  . linezolid (ZYVOX) IV 600 mg (11/01/20 1006)  . meropenem (MERREM) IV Stopped (11/01/20 0258)  . norepinephrine (LEVOPHED) Adult infusion 15 mcg/min (11/01/20 1000)  . prismasol BGK 4/2.5 1,000 mL/hr at 11/01/20 0739    Scheduled Medications: . B-complex with vitamin C  1 tablet Per Tube Daily  . chlorhexidine gluconate (MEDLINE KIT)  15 mL Mouth Rinse BID  . Chlorhexidine Gluconate Cloth  6 each Topical Daily  . cholecalciferol  2,000 Units Per Tube Daily  . clonazepam  1 mg Per Tube TID  . collagenase   Topical Daily  . darbepoetin (ARANESP) injection - DIALYSIS  100 mcg Subcutaneous Q Tue-1800  . docusate  100 mg Per Tube BID  . feeding supplement (PROSource TF)  90 mL Per Tube TID  . fentaNYL  1 patch Transdermal Q72H  . guaiFENesin  15 mL Per Tube Q12H  .  HYDROmorphone (DILAUDID) injection  1 mg Intravenous Q4H  . influenza vaccine adjuvanted  0.5 mL Intramuscular Tomorrow-1000  . insulin aspart  0-15 Units Subcutaneous Q4H  . insulin aspart  12 Units Subcutaneous Q4H  . insulin glargine  45 Units Subcutaneous Daily  . mouth rinse  15 mL Mouth Rinse 10 times per day  . midodrine  40 mg Per Tube TID WC  . nutrition supplement (JUVEN)  1 packet Per Tube BID BM  .  pantoprazole sodium  40 mg Per Tube BID  . polyethylene glycol  17 g Per Tube Daily  . QUEtiapine  100 mg Per Tube BID  . sodium chloride flush  10-40 mL Intracatheter Q12H    have reviewed scheduled and prn medications.  Physical Exam:  General: Critically ill appearing, eyes open, no interaction Heart:tachycardia Lungs: tachypnea Abdomen:soft,  non-distended Extremities:trace edema in BLE Dialysis Access: Right IJ TDC  Bailee Metter 11/01/2020,10:53 AM  LOS: 61 days

## 2020-11-01 NOTE — Progress Notes (Signed)
NAMETammy [name], MRN:  [id number], DOB:  January 23, 1951, LOS: 63 ADMISSION DATE:  08/31/2020, CONSULTATION DATE:  09/07/2020 REFERRING MD:  Dr Candiss Norse, CHIEF COMPLAINT:  Acute resp failure  Brief History   70 year old female who was previously diagnosed with Covid 08/23/2020.  Admitted 11/2 with AF-RVR, found to have a perforated duodenal underwent exploratory laparotomy with Phillip Heal patch placement.  Past Medical History  Covid pneumonia Atrial fibrillation CKD stage III Diabetes mellitus Hypertension Colon cancer Hyperlipidemia  Significant Hospital Events   11/2 Admitted  11/3 OR with findings of perforated duodenal ulcer 11/10 progressive hemorrhagic shock, intubated, transfused, pressors, proned; started on CRRT in PM 11/03 Exploratory laparotomy, Phillip Heal patch, lysis of adhesion for duodenal ulceration postop day 6 11/16 Extubated. Re-intubated overnight due to respiratory distress and hypoxia with decreased mentation 11/18 Bronch, cultures sent 11/19 Hgb down getting blood 11/24 Spiked fever resume empirical antimicrobial therapy 11/26 Hemorrhagic shock, hgb 5.6, increased pressors, CT A/P  11/30 Per palliative "[name] expresses understanding that patient is unfortunately very fragile despite ongoing intensive medical treatment and full mechanical support. She indicates that the family wants to continue with all current interventions despite potential outcomes". 12/08 CRRT discontinued due to clotting 12/09 Family requested transfer to tertiary care [facility] denied transfer  12/10 CRRT restarted. Episodes of tachycardia, tachypnea that seem to improve with pain management 12/11 Back in shock. Pressor requirements up. CXR worse. ABX resumed 12/12 Still requiring inc pressors. Had hypoglycemic event. Basal insulin dosing adjusted 12/13 Pressor requirements better. Now hyperglycemic. Re-adjusted Glycemic control  12/14 Changed dilaudid to1/2 dosing from day further. D/c  vasopressin.  Goals of care reconfirmed with daughter.  Patient continues to desire aggressive care.  Not open to discussing any other option, patient family continues to be hopeful that she will be discharged to home with full recovery in spite of multiple attempts by staff to prepare them that this is unlikely scenario 12/15 Dilaudid discontinued sending cortisol for ongoing pressor dependence 12/16 Serum cortisol <20, added stress dose steroids.  PRN Dilaudid, attempting not go back on Dilaudid infusion 12/17 Developed worsening tachycardia during the evening hours received initially IV albumin, followed by resuming IV Dilaudid with question of suboptimal pain control.  Currently looks better back on Dilaudid drip.  We have been able to wean pressors after adding stress dose steroids; near arrest - bradycardia, better with atropine  12/19 Afebrile . Remains on dilaudid and heparin gtt, dilaudid gtt increased overnight for concern of pain / ongoing tachycardia, no other events . NE and precedex off 12/17. Ongoing CRRT- even UF, dosing lokelmia this morning 12/20 On CRRT.  Renal plans for HD cath and moving to HD. Getting wound care  12/23 -No vent weaning per RT, remains on full support, #8 trach in place but previously had #6, no documentation of change noted in chart Afebrile / WBC 20.4  Vent - 30% FiO2, PEEP 5 Glucose range 176-212 I/O 465 ml stool, 3.6L removed with HD, -1.1L in last 24 hours  RN reports ongoing periods of tachypnea / vent dyssynchrony that responds to dilaudid   12/24 - renal stopping CRRT today and plans HD 10/24/20 . 40% fio2 on vent via Trach. TAchypenic and tachycardic. Afebrile but wbc up to 27.6K. On TF. Onn heparin gtt  12/25 - Back on CRRT. On vent via trach at 40% fio2, On scheduled dilaudid as add on to oxy. Per family request 12/24 - they felt scheduled oxy was not adequate and patien was showing signs  of opioid withdrawal.  Patient also had  worsening SIRS/sepsis  syndrome. Had fever, rising wbc, worsening RR 40-60 and HR 140s sinsu-> strated On abx yesterday. Fever some better today. WBC plateau at 28,.5K. On new levophed -> signifanct improvement in HR 77 and RR t0 20. On heparin gtt.  On precedex gtt. On levophed gtt 59mcg wthi midodrine. On TF 12/30 Remains on CRRT with intermittent pressor requirements. Ethics consult placed evening of 12/29. Ethics rec time trial of CRRT 12/31 failed SBT with RR 40s. Several conversations between care tam and family, who are upset RE plan of care 1/1 back on pressors   Consults:  Cardiology [facility] Nephrology  Ethics  Procedures:  R PICC 11/5 >> A line 11/9 >> out ETT 11/9 > 11/16, 11/16 >> 09/21/2020, 09/21/2020 tracheostomy>> Lt [place] CVL 11/9 >> R IJ trialysis >> out HD catheter 12/1 >>12/20 12/21 - 14.5 Fr, 23 cm right IJ tunneled hemodialysis catheter placement.  Removal of indwelling subclavian catheter.   Significant Diagnostic Tests:  11/3 CT abd/ pelvis > 1. Positive for bowel perforation: Pneumoperitoneum and intermediate density free fluid in the abdomen. Prior total colectomy. The specific site of perforation is unclear-oral contrast present to the proximal jejunum has not obviously leaked. Note that there may be small bowel loops adherent to the ventral abdominal wall along the greater curve of the stomach. 2. Extensive bilateral lower lung pneumonia. No pleural effusion. 3. Other abdominal and pelvic viscera are stable since 2015, including bilateral adrenal adenomas. Chronic renal parapelvic cysts. 4. Aortic Atherosclerosis 11/3 TTE > EF 70-75%, RV not well visualized, mildly reduced RV systolic function 64/40 CT chest/ abd/ pelvis> 1. Interval progression of diffuse bilateral hazy ground-glass airspace opacities with more focal areas of consolidation at the lung bases 2. Trace bilateral pleural effusions. 3. Postsurgical changes the abdomen as detailed above. No evidence for a postoperative  abscess, however evaluation is limited by lack of IV contrast. 4. There is a 1.9 cm cystic appearing lesion located in the pancreatic body. This was not present on the patient's CT from 2015.  Follow-up with an outpatient contrast enhanced MRI is recommended. 5. The endometrial stripe appears diffusely thickened. Follow-up with pelvic ultrasound is recommended. Aortic Atherosclerosis 11/14 LE doppler studies > + DVT of right posterior tibial and peroneal vein, +dVT of left posterior tibial vein  11/26 CT ABD PEL > liver unremarkable, distended gallbladder with layering tiny gallstones versus sludge, no duct dilatation, mild hyperdensity right upper pole renal collecting system new.  No evidence of retroperitoneal bleeding.  Multifocal lower lobe predominant pulmonary infiltrates/pneumonia.  Small left pleural effusion.  Micro Data:  11/10 MRSA PCR > neg 11/10 BC x 2 > neg 09/22/2020 blood cultures x2>> S epi.  09/22/2020 sputum culture>> MRSA Blood 12/1 >> negative. S.epi 1 out of 2, likely contaminant BCx 2 12/12 >> neg xxx Trach aspirate 12/24 - FEW STAPHYLOCOCCUS AUREUS  Blood 12/24 > Negative  Blood 1/2: Sputum 1/2:  Antimicrobials:  azithro 11/2 >11/3 Ceftriaxone 11/2  Fluconazole 11/3 Zosyn 11/3 >> 11/7 Vanc 11/10 off Cefepime 11/10 > 11/16 09/22/2020 vancomycin for MRSA PNA >> 12/2 xxxx Vanc 12/11> 12/17 Zosyn 12/11> 12/17 xxxx vanc 12/24 (staph resp cutlure) >> 12/28 Zosyn 12/24 - 12/27 Cefazolin 12/27 >>1/1 linezolid 1/2 merrem 1/2 eraxis 1/2   Subjective:   Afebrile, increasing doses of norepinephrine this morning. Still on CRRT. On precedex and high doses of opioids scheduled. Appears tachypnic and uncomfortable. VBG obtained.   Objective   Blood  pressure (!) 82/62, pulse (!) 129, temperature 98.4 F (36.9 C), temperature source Oral, resp. rate (!) 41, height 5\' 8"  (1.727 m), weight 75.1 kg, SpO2 97 %.    Vent Mode: PRVC FiO2 (%):  [40 %-100 %] 100 % Set  Rate:  [28 bmp] 28 bmp Vt Set:  [450 mL] 450 mL PEEP:  [5 cmH20] 5 cmH20 Plateau Pressure:  [11 cmH20-13 cmH20] 11 cmH20   Intake/Output Summary (Last 24 hours) at 11/01/2020 1144 Last data filed at 11/01/2020 1100 Gross per 24 hour  Intake 3915.57 ml  Output 4393 ml  Net -477.43 ml   Filed Weights   10/30/20 0500 10/31/20 0429 11/01/20 0346  Weight: 75.2 kg 76.2 kg 75.1 kg   Physical Exam  General: Chronically and critically ill debilitated F, reclined in bed, uncomfortable HEENT: trach secure. Anicteric sclera. Neuro: Opens eyes, does not follow commands.  CV: s1s2 no rgm cap refill < 3 seconds  PULM:  Bilateral rhonchi, remains vent dependent with mild tachypnea  GI: soft round ndnt.  Extremities: Decreased muscle tone BUE BLE. BLE boots.  Skin: c/d/w. Sacral wound.   Resolved problems:  Previously hemorrhagic shock requiring PRBCs, Septic shock, MRSA pneumonia s/p 8 days abx Complicated by MRSA healthcare associated pneumonia (treated) Peritonitis and perforated ulcer post repair 09/01/20  Assessment:  Candace [name] is a 70 y.o. with history of COVId 19 infection requiring hospitalization in October 2021 with current admission for hemorrhagic shock secondary to perforated gastric ulcer requiring surgical intervention and [place]. Her medical issues are as follows:  Acute metabolic encephalopathy Prolonged critical illness, sedation/pain -will try dc precedex 1/2 -mental status clearly improving -dilaudid, change to fentanyl patch, seroquel, clonazepam   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 status post tracheostomy VDRF -VAP, pulm hygiene - chest xray obtained this morning - no significant change.  -continue vent wean efforts   New MSSA HCAP vs bronchitis 12/24 Has had multiple rounds of abx for multiple VAP's this admission -Ancef -pct remains elevated but lower than previous will trend -lactate normal -wbc  rising.  -send repeat cx yet again -broaden abx and include antifungal until fungitell back. -really would benefit from line holiday but due to crrt and medication requirement this is not possible for now.    Persistent vasoplegia  -over 50 days -- big picture, long term pressors is not sustainable  -on max dose midodrine -PRN pressors -check cortisol normal (no indication for stress steroids) -fungitell sent 1/1   Renal failure on CRRT  -over 50 days-- big picture, long term crrt not sustainable -Nephro following -appreciate ethics.  -have personally had multiple conversations with daughter that the vent is not the issue for life sustaining the repeat infections and inability to wean from crrt is.    Afib/DVT - heparin drip - intermittent rate control  Critical Illness Myopathy Severe deconditioning  Inadequate PO intake  - PT following - EN per RDN  DM2 with hyperglycemia -changing insulin reg for better control  Stage IV sacral ulcer -WOC, hydrotherapy, dakins   History of stage IV colon cancer 1.9 cm cystic appearing lesion located in the pancreatic body  GOC -multiple GOC conversations with various PCCM providers and family have not yielded changes in scope of tx, multiple PCCM providers have been fired by family.  -concerns for futility of certain interventions -ethics consulted 12/30-- recs for time limited trial of CRRT, please see separate Ethics consult note for full details. Futility policy reviewed  with family by ethics.    Best practice:  Diet: EN  Pain/Anxiety/Delirium protocol (if indicated): titrating.  VAP protocol (if indicated): yes DVT prophylaxis: IV heparin GI prophylaxis: Protonix Glucose control: SSI, TF coverage, lantus Mobility: PT Family communication: updated 11/02/19 by Dr. Shearon Stalls.  The patient is critically ill with multiple organ systems failure and requires high complexity decision making for assessment and support, frequent  evaluation and titration of therapies, application of advanced monitoring technologies and extensive interpretation of multiple databases.   Critical Care Time devoted to patient care services described in this note is 52 minutes. This time reflects time of care of this [place] . This critical care time does not reflect separately billable procedures or procedure time, teaching time or supervisory time of PA/NP/Med student/Med Resident etc but could involve care discussion time.  [facility] 11/01/2020 11:45 AM  Pager: [phone] After hours pager: [phone]

## 2020-11-02 DIAGNOSIS — R579 Shock, unspecified: Secondary | ICD-10-CM | POA: Diagnosis not present

## 2020-11-02 DIAGNOSIS — E43 Unspecified severe protein-calorie malnutrition: Secondary | ICD-10-CM

## 2020-11-02 DIAGNOSIS — I4891 Unspecified atrial fibrillation: Secondary | ICD-10-CM | POA: Diagnosis not present

## 2020-11-02 DIAGNOSIS — J80 Acute respiratory distress syndrome: Secondary | ICD-10-CM | POA: Diagnosis not present

## 2020-11-02 DIAGNOSIS — J9621 Acute and chronic respiratory failure with hypoxia: Secondary | ICD-10-CM | POA: Diagnosis not present

## 2020-11-02 DIAGNOSIS — G9341 Metabolic encephalopathy: Secondary | ICD-10-CM

## 2020-11-02 LAB — CBC
HCT: 23.3 % — ABNORMAL LOW (ref 36.0–46.0)
Hemoglobin: 6.9 g/dL — CL (ref 12.0–15.0)
MCH: 29 pg (ref 26.0–34.0)
MCHC: 29.6 g/dL — ABNORMAL LOW (ref 30.0–36.0)
MCV: 97.9 fL (ref 80.0–100.0)
Platelets: 227 10*3/uL (ref 150–400)
RBC: 2.38 MIL/uL — ABNORMAL LOW (ref 3.87–5.11)
RDW: 16.5 % — ABNORMAL HIGH (ref 11.5–15.5)
WBC: 35.9 10*3/uL — ABNORMAL HIGH (ref 4.0–10.5)
nRBC: 6.7 % — ABNORMAL HIGH (ref 0.0–0.2)

## 2020-11-02 LAB — RENAL FUNCTION PANEL
Albumin: 2.2 g/dL — ABNORMAL LOW (ref 3.5–5.0)
Albumin: 2.1 g/dL — ABNORMAL LOW (ref 3.5–5.0)
Anion gap: 10 (ref 5–15)
Anion gap: 10 (ref 5–15)
BUN: 54 mg/dL — ABNORMAL HIGH (ref 8–23)
BUN: 45 mg/dL — ABNORMAL HIGH (ref 8–23)
CO2: 22 mmol/L (ref 22–32)
CO2: 23 mmol/L (ref 22–32)
Calcium: 9.9 mg/dL (ref 8.9–10.3)
Calcium: 10.4 mg/dL — ABNORMAL HIGH (ref 8.9–10.3)
Chloride: 99 mmol/L (ref 98–111)
Chloride: 101 mmol/L (ref 98–111)
Creatinine, Ser: 0.58 mg/dL (ref 0.44–1.00)
Creatinine, Ser: 0.55 mg/dL (ref 0.44–1.00)
GFR, Estimated: >60 mL/min (ref >60)
GFR, Estimated: >60 mL/min (ref >60)
Glucose, Bld: 272 mg/dL — ABNORMAL HIGH (ref 70–99)
Glucose, Bld: 249 mg/dL — ABNORMAL HIGH (ref 70–99)
Phosphorus: 3.9 mg/dL (ref 2.5–4.6)
Phosphorus: 3.4 mg/dL (ref 2.5–4.6)
Potassium: 4.5 mmol/L (ref 3.5–5.1)
Potassium: 4.3 mmol/L (ref 3.5–5.1)
Sodium: 131 mmol/L — ABNORMAL LOW (ref 135–145)
Sodium: 134 mmol/L — ABNORMAL LOW (ref 135–145)

## 2020-11-02 LAB — HEMOGLOBIN AND HEMATOCRIT, BLOOD
HCT: 28.6 % — ABNORMAL LOW (ref 36.0–46.0)
Hemoglobin: 8.8 g/dL — ABNORMAL LOW (ref 12.0–15.0)

## 2020-11-02 LAB — MAGNESIUM: Magnesium: 2.5 mg/dL — ABNORMAL HIGH (ref 1.7–2.4)

## 2020-11-02 LAB — GLUCOSE, CAPILLARY
Glucose-Capillary: 280 mg/dL — ABNORMAL HIGH (ref 70–99)
Glucose-Capillary: 226 mg/dL — ABNORMAL HIGH (ref 70–99)
Glucose-Capillary: 239 mg/dL — ABNORMAL HIGH (ref 70–99)
Glucose-Capillary: 232 mg/dL — ABNORMAL HIGH (ref 70–99)
Glucose-Capillary: 175 mg/dL — ABNORMAL HIGH (ref 70–99)
Glucose-Capillary: 208 mg/dL — ABNORMAL HIGH (ref 70–99)

## 2020-11-02 LAB — PREPARE RBC (CROSSMATCH)

## 2020-11-02 LAB — HEPARIN LEVEL (UNFRACTIONATED): Heparin Unfractionated: 0.65 IU/mL (ref 0.30–0.70)

## 2020-11-02 MED ORDER — MIDAZOLAM HCL 2 MG/2ML IJ SOLN
INTRAMUSCULAR | Status: AC
  Administered 2020-11-02: 2 mg via INTRAVENOUS
  Filled 2020-11-02: qty 2

## 2020-11-02 MED ORDER — MIDAZOLAM HCL 2 MG/2ML IJ SOLN
1.0000 mg | INTRAMUSCULAR | Status: DC | PRN
  Administered 2020-11-03 – 2020-11-04 (×3): 2 mg via INTRAVENOUS
  Administered 2020-11-04: 1 mg via INTRAVENOUS
  Filled 2020-11-02 (×4): qty 2

## 2020-11-02 MED ORDER — INSULIN GLARGINE 100 UNIT/ML ~~LOC~~ SOLN
25.0000 [IU] | Freq: Two times a day (BID) | SUBCUTANEOUS | Status: DC
  Administered 2020-11-02 – 2020-11-04 (×5): 25 [IU] via SUBCUTANEOUS
  Filled 2020-11-02 (×11): qty 0.25

## 2020-11-02 MED ORDER — SODIUM CHLORIDE 0.9% IV SOLUTION: Freq: Once | INTRAVENOUS | Status: AC

## 2020-11-02 MED ORDER — INSULIN ASPART 100 UNIT/ML ~~LOC~~ SOLN
14.0000 [IU] | SUBCUTANEOUS | Status: DC
  Administered 2020-11-02 – 2020-11-05 (×20): 14 [IU] via SUBCUTANEOUS

## 2020-11-02 MED ORDER — MIDAZOLAM HCL 2 MG/2ML IJ SOLN: 2.0000 mg | Freq: Once | INTRAMUSCULAR | Status: DC

## 2020-11-02 NOTE — Progress Notes (Signed)
CRITICAL VALUE ALERT  Critical Value:  Hgb 6.9  Date & Time Notied:  11/02/20 @ 0254  Provider Notified: Warren Lacy MD

## 2020-11-02 NOTE — Progress Notes (Signed)
[facility] Progress Note   Assessment/ Plan:   # AKI- oliguric now likely anuric. ATN for shock.Started on CRRT 09/08/20 due to persistent oliguria/anuria and hyperkalemia.[place] placed on 10/19/20. -All fluids 4K/2.5Ca: Pre-filter to 500 ml/hr, post-filter 200 ml/hr, and dialysate to 1000 ml/hr. Continue to keep even. No anticoagulation through circuit (on systemic hep gtt) -Patient has failed multiple transitions to HD w/ low Bps requiring pressors -No signs of meaningful recovery from our team -Family expressed ongoing wishes for aggressive care -Ethics seen 12/30 and suggested time trial of 5-7 days CV while finishing treatment for PNA - She remains on pressors.  Another transition to IHD could be attempted if she remains on levophed < 10--> but would that really be forward progress if she still required pressors?  # h/o hemorrhagic shock/ ABLA- ongoing blood lossTransfuse as needed, have added ESA.  Hgb 6.9 11/02/20, plan for 1 u pRBCs  # Septic shock: still requiring pressors, over max dose of midodrine, on meropenem.  Repeat blood cultures 10/31/20 NGTD, most recent positive cultures MSSA 12/24.  # Perforated duodenal ulcer- s/p exploratory lap and Graham patch placement per surgery.  # Acute hypoxic/hypercapnic respiratory failure due to ARDS from Recent Covid-19 PNA- currently on vent via trach per PCCM. Weaning efforts have not been successful lately.    # Atrial fibrillation- on hep gtt  # Hyponatremia- worsened quickly off of CRRT-  manage with crrt.   # Severe protein malnutrition- per primary svc.  # Disposition- poor overall prognosis.  Family meeting today per notes.    Subjective:    Seen in room.  Sitting up in bed, able to track today.  CRRT continues, keeping even.  Levophed down to 8 this AM. Hgb down to 6.9, plan for 1 u pRBCs.       Objective:   BP 93/64   Pulse 96   Temp 97.6 F (36.4 C) (Oral)   Resp (!) 31   Ht _0  (1.727 m)    Wt 75.7 kg   SpO2 98%   BMI 25.38 kg/m   Physical Exam: Gen: sitting up in bed, tracking today HEENT: trach in place CVS: RRR Resp: tachypneic Abd: soft, nontender Ext: no LE edema ACCESS: R IJ TDC  Labs: BMET Recent Labs  Lab 10/30/20 0500 10/30/20 1608 10/31/20 0653 10/31/20 1710 11/01/20 0330 11/01/20 1623 11/02/20 0220  NA 133* 132* 134* 133* 132* 130* 131*  K 4.5 5.1 5.0 5.0 5.0 4.6 4.5  CL 98 100 98 101 100 99 99  CO2 _1 GLUCOSE 215* 272* 214* 240* 270* 251* 272*  BUN 34* 33* 29* 31* 33* 45* 54*  CREATININE 0.57 0.67 0.58 0.57 0.52 0.56 0.58  CALCIUM 10.5* 10.4* 10.8* 11.0* 10.6* 9.8 9.9  PHOS 3.3 4.5 3.5 3.5 3.6 3.6 3.9   CBC Recent Labs  Lab 10/31/20 0653 10/31/20 1018 11/01/20 0330 11/02/20 0220  WBC 25.5* 23.1* 33.4* 35.9*  NEUTROABS  --  16.5*  --   --   HGB 7.4* 7.4* 7.1* 6.9*  HCT 24.3* 25.5* 23.3* 23.3*  MCV 96.0 96.2 95.9 97.9  PLT 240 224 234 227      Medications:    . B-complex with vitamin C  1 tablet Per Tube Daily  . chlorhexidine gluconate (MEDLINE KIT)  15 mL Mouth Rinse BID  . Chlorhexidine Gluconate Cloth  6 each Topical Daily  . cholecalciferol  2,000 Units Per Tube Daily  . clonazepam  1 mg Per Tube TID  . collagenase   Topical Daily  . darbepoetin (ARANESP) injection - DIALYSIS  100 mcg Subcutaneous Q Tue-1800  . docusate  100 mg Per Tube BID  . feeding supplement (PROSource TF)  90 mL Per Tube TID  . fentaNYL  1 patch Transdermal Q72H  . guaiFENesin  15 mL Per Tube Q12H  .  HYDROmorphone (DILAUDID) injection  1 mg Intravenous Q4H  . influenza vaccine adjuvanted  0.5 mL Intramuscular Tomorrow-1000  . insulin aspart  0-15 Units Subcutaneous Q4H  . insulin aspart  14 Units Subcutaneous Q4H  . insulin glargine  25 Units Subcutaneous BID  . mouth rinse  15 mL Mouth Rinse 10 times per day  . midodrine  40 mg Per Tube TID WC  . nutrition supplement (JUVEN)  1 packet Per Tube BID BM  . pantoprazole sodium   40 mg Per Tube BID  . polyethylene glycol  17 g Per Tube Daily  . QUEtiapine  100 mg Per Tube BID  . sodium chloride flush  10-40 mL Intracatheter Q12H     Madelon Lips MD 11/02/2020, 9:57 AM

## 2020-11-02 NOTE — Progress Notes (Addendum)
NAMEMakira Holleman, MRN:  [id number], DOB:  [birth year], LOS: 47 ADMISSION DATE:  08/31/2020, CONSULTATION DATE:  09/07/2020 REFERRING MD:  Dr Candiss Norse, CHIEF COMPLAINT:  Acute resp failure  Brief History   70 year old female who was previously diagnosed with Covid 08/23/2020.  Admitted 11/2 with AF-RVR, found to have a perforated duodenal underwent exploratory laparotomy with Phillip Heal patch placement 11/3.  Past Medical History  Covid pneumonia Atrial fibrillation CKD stage III Diabetes mellitus Hypertension Colon cancer Hyperlipidemia  Significant Hospital Events   11/2 Admitted  11/3 OR with findings of perforated duodenal ulcer 11/10 progressive hemorrhagic shock, intubated, transfused, pressors, proned; started on CRRT in PM 11/03 Exploratory laparotomy, Phillip Heal patch, lysis of adhesion for duodenal ulceration postop day 6 11/16 Extubated. Re-intubated overnight due to respiratory distress and hypoxia with decreased mentation 11/18 Bronch, cultures sent 11/19 Hgb down getting blood 11/24 Spiked fever resume empirical antimicrobial therapy 11/26 Hemorrhagic shock, hgb 5.6, increased pressors, CT A/P  11/30 Per palliative "[name] expresses understanding that patient is unfortunately very fragile despite ongoing intensive medical treatment and full mechanical support. She indicates that the family wants to continue with all current interventions despite potential outcomes". 12/08 CRRT discontinued due to clotting 12/09 Family requested transfer to tertiary care [facility]). [facility] denied transfer  12/10 CRRT restarted. Episodes of tachycardia, tachypnea that seem to improve with pain management 12/11 Back in shock. Pressor requirements up. CXR worse. ABX resumed 12/12 Still requiring inc pressors. Had hypoglycemic event. Basal insulin dosing adjusted 12/13 Pressor requirements better. Now hyperglycemic. Re-adjusted Glycemic control  12/14 Changed dilaudid to1/2 dosing from day further. D/c  vasopressin.  Goals of care reconfirmed with daughter.  Patient continues to desire aggressive care.  Not open to discussing any other option, patient family continues to be hopeful that she will be discharged to home with full recovery in spite of multiple attempts by staff to prepare them that this is unlikely scenario 12/15 Dilaudid discontinued sending cortisol for ongoing pressor dependence 12/16 Serum cortisol <20, added stress dose steroids.  PRN Dilaudid, attempting not go back on Dilaudid infusion 12/17 Developed worsening tachycardia during the evening hours received initially IV albumin, followed by resuming IV Dilaudid with question of suboptimal pain control.  Currently looks better back on Dilaudid drip.  We have been able to wean pressors after adding stress dose steroids; near arrest - bradycardia, better with atropine  12/19 Afebrile . Remains on dilaudid and heparin gtt, dilaudid gtt increased overnight for concern of pain / ongoing tachycardia, no other events . NE and precedex off 12/17. Ongoing CRRT- even UF, dosing lokelmia this morning 12/20 On CRRT.  Renal plans for HD cath and moving to HD. Getting wound care  12/23 -No vent weaning per RT, remains on full support, #8 trach in place but previously had #6, no documentation of change noted in chart Afebrile / WBC 20.4  Vent - 30% FiO2, PEEP 5 Glucose range 176-212 I/O 465 ml stool, 3.6L removed with HD, -1.1L in last 24 hours  RN reports ongoing periods of tachypnea / vent dyssynchrony that responds to dilaudid   12/24 - renal stopping CRRT today and plans HD 10/24/20 . 40% fio2 on vent via Trach. TAchypenic and tachycardic. Afebrile but wbc up to 27.6K. On TF. Onn heparin gtt  12/25 - Back on CRRT. On vent via trach at 40% fio2, On scheduled dilaudid as add on to oxy. Per family request 12/24 - they felt scheduled oxy was not adequate and patient was showing  signs of opioid withdrawal.  Patient also had  worsening SIRS/sepsis  syndrome. Had fever, rising wbc, worsening RR 40-60 and HR 140s sinus-> started On abx yesterday. Fever some better today. WBC plateau at 28,.5K. On new levophed -> signifanct improvement in HR 77 and RR t0 20. On heparin gtt.  On precedex gtt. On levophed gtt 48mcg wthi midodrine. On TF 12/30 Remains on CRRT with intermittent pressor requirements. Ethics consult placed evening of 12/29. Ethics rec time trial of CRRT 12/31 failed SBT with RR 40s. Several conversations between care tam and family, who are upset RE plan of care 1/1 back on pressors  1/4: On pressors, keeping even on CVVHD, HGB drop to 6.9, transfused 1 unit. Improving mental status  Consults:  Cardiology [facility] Nephrology  Ethics  Procedures:  R PICC 11/5 >> A line 11/9 >> out ETT 11/9 > 11/16, 11/16 >> 09/21/2020, 09/21/2020 tracheostomy>> Lt [place] CVL 11/9 >> R IJ trialysis >> out HD catheter 12/1 >>12/20 12/21 - 14.5 Fr, 23 cm right IJ tunneled hemodialysis catheter placement.  Removal of indwelling subclavian catheter.   Significant Diagnostic Tests:  11/3 CT abd/ pelvis > 1. Positive for bowel perforation: Pneumoperitoneum and intermediate density free fluid in the abdomen. Prior total colectomy. The specific site of perforation is unclear-oral contrast present to the proximal jejunum has not obviously leaked. Note that there may be small bowel loops adherent to the ventral abdominal wall along the greater curve of the stomach. 2. Extensive bilateral lower lung pneumonia. No pleural effusion. 3. Other abdominal and pelvic viscera are stable since 2015, including bilateral adrenal adenomas. Chronic renal parapelvic cysts. 4. Aortic Atherosclerosis 11/3 TTE > EF 70-75%, RV not well visualized, mildly reduced RV systolic function 63/84 CT chest/ abd/ pelvis> 1. Interval progression of diffuse bilateral hazy ground-glass airspace opacities with more focal areas of consolidation at the lung bases 2. Trace bilateral pleural  effusions. 3. Postsurgical changes the abdomen as detailed above. No evidence for a postoperative abscess, however evaluation is limited by lack of IV contrast. 4. There is a 1.9 cm cystic appearing lesion located in the pancreatic body. This was not present on the patient's CT from 2015.  Follow-up with an outpatient contrast enhanced MRI is recommended. 5. The endometrial stripe appears diffusely thickened. Follow-up with pelvic ultrasound is recommended. Aortic Atherosclerosis 11/14 LE doppler studies > + DVT of right posterior tibial and peroneal vein, +dVT of left posterior tibial vein  11/26 CT ABD PEL > liver unremarkable, distended gallbladder with layering tiny gallstones versus sludge, no duct dilatation, mild hyperdensity right upper pole renal collecting system new.  No evidence of retroperitoneal bleeding.  Multifocal lower lobe predominant pulmonary infiltrates/pneumonia.  Small left pleural effusion.  Micro Data:  11/10 MRSA PCR > neg 11/10 BC x 2 > neg 09/22/2020 blood cultures x2>> S epi.  09/22/2020 sputum culture>> MRSA Blood 12/1 >> negative. S.epi 1 out of 2, likely contaminant BCx 2 12/12 >> neg xxx Trach aspirate 12/24 - FEW STAPHYLOCOCCUS AUREUS  Blood 12/24 > Negative  Blood 1/2: Sputum 1/2:  Antimicrobials:  azithro 11/2 >11/3 Ceftriaxone 11/2  Fluconazole 11/3 Zosyn 11/3 >> 11/7 Vanc 11/10 off Cefepime 11/10 > 11/16 09/22/2020 vancomycin for MRSA PNA >> 12/2 xxxx Vanc 12/11> 12/17 Zosyn 12/11> 12/17 xxxx vanc 12/24 (staph resp cutlure) >> 12/28 Zosyn 12/24 - 12/27 Cefazolin 12/27 >>1/1 linezolid 1/2 merrem 1/2 eraxis 1/2   Subjective:   T max 100.3, WBC  HGB 6.9>> order for 1 unit per  E link over night 1/4 WBC 35.9 >> CXR 1/3, confirms bilateral pneumonia PCT 3.88 Remains on Norepi at 8 mcg Midodrine at 40 mg TID Remains on Precedex at 0.8 Remains on CVVHD , keeping even  Net + 1200 but appears dry ( Skin tenting) Over breathing vent   CBG's > 200 as high as 300  Objective   Blood pressure 101/69, pulse 95, temperature 97.6 F (36.4 C), temperature source Oral, resp. rate (!) 33, height 5\' 8"  (1.727 m), weight 75.7 kg, SpO2 98 %. CVP:  [3 mmHg] 3 mmHg  Vent Mode: PRVC FiO2 (%):  [40 %-100 %] 40 % Set Rate:  [28 bmp] 28 bmp Vt Set:  [450 mL] 450 mL PEEP:  [5 cmH20] 5 cmH20 Plateau Pressure:  [11 cmH20-28 cmH20] 28 cmH20   Intake/Output Summary (Last 24 hours) at 11/02/2020 0841 Last data filed at 11/02/2020 0801 Gross per 24 hour  Intake 4762.78 ml  Output 2972 ml  Net 1790.78 ml   Filed Weights   10/31/20 0429 11/01/20 0346 11/02/20 0500  Weight: 76.2 kg 75.1 kg 75.7 kg   Physical Exam  General: Chronically and critically ill debilitated F, upright  in bed, nods head to questions, flat affect HEENT: trach secure. Anicteric sclera. Neuro: Opens eyes, nods head to yes no questions.  CV: s1s2 no rgm cap refill < 3 seconds  PULM:  Bilateral rhonchi, remains vent dependent with mild tachypnea , overbreathing vent GI: soft round ndnt., stoma is pink with tan stool noted, and dressing is clean dry and intact  Extremities: Decreased muscle tone BUE BLE. BLE boots.  Skin: c/d/w. Sacral wound.   Resolved problems:  Previously hemorrhagic shock requiring PRBCs, Septic shock, MRSA pneumonia s/p 8 days abx Complicated by MRSA healthcare associated pneumonia (treated) Peritonitis and perforated ulcer post repair 09/01/20  Assessment:  Chantale Leugers is a 70 y.o. with history of COVId 19 infection requiring hospitalization in October 2021 with current admission for hemorrhagic shock secondary to perforated gastric ulcer requiring surgical intervention and [place]. Her medical issues are as follows:  Acute metabolic encephalopathy Prolonged critical illness, sedation/pain - wean precedex as able - mental status clearly improving - dilaudid, change to fentanyl patch, seroquel, clonazepam   Acute on chronic  hypoxic/hypercapnic respiratory failure due to ARDS from COVID-19 status post tracheostomy Stable bilateral pneumonia VDRF -VAP, pulm hygiene - chest xray obtained this morning - no significant change. - CXR prn  - continue vent wean efforts   New MSSA HCAP vs bronchitis 12/24 Has had multiple rounds of abx for multiple VAP's this admission -Ancef -pct remains elevated but lower than previous will trend -lactate normal -wbc rising.  -send repeat cx yet again -broaden abx and include antifungal until fungitell back. -really would benefit from line holiday but due to crrt and medication requirement this is not possible for now.    Persistent vasoplegia  -over 50 days -- big picture, long term pressors is not sustainable  -on max dose midodrine -PRN pressors>> currently is on Norepi  at 8 mcg -cortisol normal (no indication for stress steroids) -fungitell sent 1/1   Renal failure on CRRT  -over 50 days-- big picture, long term crrt not sustainable -Nephro following -appreciate ethics.  -have personally had multiple conversations with daughter that the vent is not the issue for life sustaining the repeat infections and inability to wean from crrt is.    Afib/DVT HGB drop 1/4 to 6.9>> received 1 unit PRBC No obvious bleeding -  continue  heparin drip per pharmacy - Monitor for bleeding/ CBC daily - Will recheck CBC  with 4 pm renal panel 1/4 - intermittent rate control   Critical Illness Myopathy Severe deconditioning  Inadequate PO intake  - PT following - EN per RDN  DM2 with hyperglycemia -changing insulin reg for better control - Will increase lantus , and split into BID dosing. - Will increase Q 4 coverage to 14 units from 12  Stage IV sacral ulcer -WOC, hydrotherapy, dakins   History of stage IV colon cancer 1.9 cm cystic appearing lesion located in the pancreatic body  GOC -multiple GOC conversations with various PCCM providers and family have not yielded  changes in scope of tx, multiple PCCM providers have been fired by family.  -concerns for futility of certain interventions -ethics consulted 12/30-- recs for time limited trial of CRRT, please see separate Ethics consult note for full details. Futility policy reviewed with family by ethics. Dr. Shearon Stalls and Dr. Hulen Skains  to have discussion with daughter. Pt is more alert, hopeful she can make her wishes known.     Best practice:  Diet: EN  Pain/Anxiety/Delirium protocol (if indicated): titrating.  VAP protocol (if indicated): yes DVT prophylaxis: IV heparin GI prophylaxis: Protonix Glucose control: SSI, TF coverage, lantus Mobility: PT Family communication: updated 11/02/19 by Dr. Shearon Stalls.  The patient is critically ill with multiple organ systems failure and requires high complexity decision making for assessment and support, frequent evaluation and titration of therapies, application of advanced monitoring technologies and extensive interpretation of multiple databases.   Critical Care Time devoted to patient care services described in this note is 45 minutes. This time reflects time of care of this Elias-Fela Solis . This critical care time does not reflect separately billable procedures or procedure time, teaching time or supervisory time of PA/NP/Med student/Med Resident etc but could involve care discussion time.  Magdalen Spatz, MSN, AGACNP-BC [place] for personal pager PCCM on call pager [phone] 11/02/2020 9:33 AM

## 2020-11-02 NOTE — Progress Notes (Signed)
Physical Therapy Treatment Patient Details Name: Candace [name] MRN: [id number] DOB: 02-Aug-1951 Today's Date: 11/02/2020    History of Present Illness 70 y.o. female with medical history significant for recent covid pna, obesity, colon cancer s/p colon resection with colostomy bag, HLD, NIDDM2, CKD3 presented to ED after her follow-up nurse advised her to present to ED for elevated HR. +afib, elevated troponins with demand ischemia,  CT scan is positive for bowel perforation with pneumomediastinum 11/03 exp lap with repair of perforated ulcer. 11/10 t/f to ICU- intubated/sedated/proned. Trach placed 11/24. CRRT off 12/8, restarted 12/10.    PT Comments    Pt with continued decline in physical function and response to commands. Pt with atrophy of bil hand and feet intrinsics, grimace to movement of bil elbows beyond 90degrees of flexion, no trace movement proximal to bil hands or feet and tightening of all neck musculature toward right rotation. At this time acute therapy is not going to significantly impact pt beyond education of family for PROM. Will attempt to setup family education meeting for ROM as primary goal for therapy and decrease frequency to 1x/wk.  HR 105 SpO2 98% on vent 40%   Follow Up Recommendations  LTACH;Supervision/Assistance - 24 hour     Equipment Recommendations       Recommendations for Other Services       Precautions / Restrictions Precautions Precautions: Fall Precaution Comments: baseline colostomy; trach-vent; CRRT; A line, sacral wound    Mobility  Bed Mobility               General bed mobility comments: Transitioned to chair position using bed, total assist. Pt with neck in nearly midline position with max assist to rotate toward left. Total assist to perform anterior translation of trunk from surface  Transfers                 General transfer comment: unable  Ambulation/Gait                 Stairs              Wheelchair Mobility    Modified Rankin (Stroke Patients Only)       Balance                                            Cognition Arousal/Alertness: Awake/alert Behavior During Therapy: Flat affect Overall Cognitive Status: Difficult to assess Area of Impairment: Following commands;Attention                   Current Attention Level: Focused   Following Commands: Follows one step commands inconsistently;Follows one step commands with increased time       General Comments: Eyes open 90% of the time. pt following commands 75% of the time for squeezing bil hands with delay and very weak, pt will open mouth when cued and attempt to move feet but only trace activation      Exercises General Exercises - Upper Extremity Shoulder Flexion: PROM;Both;10 reps;Seated Shoulder ABduction: PROM;10 reps;Supine;Both Elbow Flexion: Both;PROM;10 reps;Seated Elbow Extension: Both;PROM;10 reps;Seated Wrist Flexion: PROM;Both;Supine;10 reps Wrist Extension: PROM;Both;10 reps;Supine Composite Extension: AAROM;Both;Seated;10 reps General Exercises - Lower Extremity Ankle Circles/Pumps: PROM;Both;10 reps;Seated Heel Slides: PROM;Both;10 reps;Supine Hip ABduction/ADduction: PROM;10 reps;Both;Seated    General Comments        Pertinent Vitals/Pain Pain Score: 5  Pain Location: grimace with right elbow flexion  Pain Descriptors / Indicators: Grimacing Pain Intervention(s): Limited activity within patient's tolerance;Monitored during session;RN gave pain meds during session;Repositioned    Home Living                      Prior Function            PT Goals (current goals can now be found in the care plan section) Acute Rehab PT Goals PT Goal Formulation: Patient unable to participate in goal setting Time For Goal Achievement: 11/16/20 Potential to Achieve Goals: Fair Progress towards PT goals: Not progressing toward goals - comment;Goals  downgraded-see care plan    Frequency    Min 1X/week      PT Plan Current plan remains appropriate;Frequency needs to be updated    Co-evaluation              AM-PAC PT "6 Clicks" Mobility   Outcome Measure  Help needed turning from your back to your side while in a flat bed without using bedrails?: Total Help needed moving from lying on your back to sitting on the side of a flat bed without using bedrails?: Total Help needed moving to and from a bed to a chair (including a wheelchair)?: Total Help needed standing up from a chair using your arms (e.g., wheelchair or bedside chair)?: Total Help needed to walk in hospital room?: Total Help needed climbing 3-5 steps with a railing? : Total 6 Click Score: 6    End of Session   Activity Tolerance: Patient tolerated treatment well Patient left: in bed;with call bell/phone within reach;with nursing/sitter in room Nurse Communication: Mobility status;Need for lift equipment PT Visit Diagnosis: Muscle weakness (generalized) (M62.81);Difficulty in walking, not elsewhere classified (R26.2);Pain;Adult, failure to thrive (R62.7);Unsteadiness on feet (R26.81)     Time: 2836-6294 PT Time Calculation (min) (ACUTE ONLY): 23 min  Charges:  $Therapeutic Exercise: 23-37 mins                     Oniya Mandarino P, PT Acute Rehabilitation Services Pager: [phone] Office: [phone]    Sandy Salaam Quince Santana 11/02/2020, 12:44 PM

## 2020-11-02 NOTE — Progress Notes (Signed)
Assisted tele visit to patient with daughter. ° °Cannon Arreola P, RN  °

## 2020-11-02 NOTE — Progress Notes (Signed)
ANTICOAGULATION CONSULT NOTE  Pharmacy Consult:  Heparin Indication: atrial fibrillation   Labs: Recent Labs    10/31/20 0643 10/31/20 0653 10/31/20 1018 10/31/20 1710 11/01/20 0330 11/01/20 1623 11/02/20 0220  HGB  --    < > 7.4*  --  7.1*  --  6.9*  HCT  --    < > 25.5*  --  23.3*  --  23.3*  PLT  --    < > 224  --  234  --  227  HEPARINUNFRC 0.54  --   --   --  0.45  --  0.65  CREATININE  --    < >  --    < > 0.52 0.56 0.58   < > = values in this interval not displayed.    Assessment: 70 yo female with history of Afib on apixaban prior to admission, which has been held and patient transitioned to IV heparin.   Heparin level remains therapeutic at 0.65 on heparin 1300 units/hr. There has been no further bleeding per RN. Today, hgb 6.9 with hgb consistently in 7s. Plt wnl.   Goal of Therapy:  Heparin level 0.3-0.5 units/ml   Plan:  Decrease heparin slightly to 1250 units/hr to ensure remains in therapeutic range with consistently low hgb and slight decrease today Daily heparin level and CBC Monitor for bleeding  Cristela Felt, PharmD Clinical Pharmacist  11/02/2020, 7:13 AM

## 2020-11-02 NOTE — Progress Notes (Signed)
Updated the pt's daughter Roland Rack via telephone about her mother's status this morning. Assisted daughter by contacting the [place] RN to set up a video chat with her mother.

## 2020-11-02 NOTE — Progress Notes (Signed)
Assisted tele visit to patient with daughter. ° °Arel Tippen P, RN  °

## 2020-11-02 NOTE — Progress Notes (Signed)
Nutrition Follow-up  DOCUMENTATION CODES:   Obesity unspecified  INTERVENTION:   Tube Feeding via Cortrak Vital 1.5 at 40ml/hr Pro-source TF 90 mL TID Provides 171g of protein, 2580kcals and 1094 mL of free water  Continue Juven BID, each packet provides 80 calories, 8 grams of carbohydrate, 2.5  grams of protein (collagen), 7 grams of L-arginine and 7 grams of L-glutamine; supplement contains CaHMB, Vitamins C, E, B12 and Zinc to promote wound healing  Continue B-complex with C   NUTRITION DIAGNOSIS:   Inadequate oral intake related to inability to eat as evidenced by NPO status.  Being addressed via TF   GOAL:   Patient will meet greater than or equal to 90% of their needs  Progressing  MONITOR:   Vent status,TF tolerance,Labs,Weight trends  REASON FOR ASSESSMENT:   Consult New TPN/TNA  ASSESSMENT:   70 y.o. female was recently hospitalized for COVID-19 pneumonia-and discharged on 2-3 L of home O2-presents with worsening shortness of breath-found to have A. fib with RVR, worsening hypoxia. Pt developed severe upper abdominal pain-subsequent further imaging studies revealed perforated viscus. S/p laparotomy on 11/3 which showed a perforated duodenal ulcer.   Ethics Committee consulted 12/30 with recommendation to continue 5-7 days CV while finishing treatment for PNA.  Pt remains on vent support via trach.  MAP (cuff) >/= 65 today MV: 15.1 L/min Temp (24hrs), Avg:98.9 F (37.2 C), Min:97.1 F (36.2 C), Max:100.3 F (37.9 C)   Remains on CRRT.  Receiving Vital 1.5 at 65 ml/hr with Prosource TF 90 ml TID via Cortrak tube. Receiving Juven BID to support wound healing.  Receiving hydrotherapy for DTI sacral wound. 20% wound bed is granulating tissue  Ileostomy with 500 mL out x 24 hours, 1 unmeasured stool also recorded. I/O + 1.1 L since admission  Vitamin labs checked 12/16: copper low at 20, vitamin D 25-hydroxy low at 18.37. Pt received additional  Copper supplement, consider recheck in near future.  Receiving Vit D supplementation.  Labs: sodium 131 (L), mag 2.5 (H) CBGs: 280-226-239 Medications reviewed and include Vitamin D3, B complex with C, aranesp, ss novolog, novolog q 4 hours, lantus, levophed, precedex.   Diet Order:   Diet Order            Diet NPO time specified  Diet effective midnight                 EDUCATION NEEDS:   Not appropriate for education at this time  Skin:  Skin Assessment: Skin Integrity Issues: Skin Integrity Issues:: Stage II DTI: sacrum Stage I: n/a Stage II: ear Incisions: abdomen  Last BM:  1/4 ileostomy  Height:   Ht Readings from Last 1 Encounters:  09/20/20 5\' 8"  (1.727 m)    Weight:   Wt Readings from Last 1 Encounters:  11/02/20 75.7 kg   Admission weight 99.7 kg (11/2)  BMI:  Body mass index is 25.38 kg/m.  Estimated Nutritional Needs:   Kcal:  7829-5621 kcals  Protein:  135-175 g  Fluid:  >/= 2 L/day   Lucas Mallow, RD, LDN, CNSC Please refer to Amion for contact information.

## 2020-11-02 NOTE — Progress Notes (Signed)
eLink Physician-Brief Progress Note Patient Name: Candace [name] DOB: 1951/06/18 MRN: [id number]   Date of Service  11/02/2020  HPI/Events of Note  Patient with  Persistent tachypnea and ventilator dyssynchrony which appears to be due to anxiety.  eICU Interventions  PRN Versed ordered.        Morine Kohlman U Jettson Crable 11/02/2020, 2:04 AM

## 2020-11-02 NOTE — Progress Notes (Signed)
eLink Physician-Brief Progress Note Patient Name: Candace [name] DOB: November 07, 1950 MRN: [id number]   Date of Service  11/02/2020  HPI/Events of Note  Hemoglobin 6.9 gm.  eICU Interventions  Order entered to transfuse 1 unit of PRBC.        Cleto Claggett U Latyra Jaye 11/02/2020, 3:00 AM

## 2020-11-02 NOTE — Progress Notes (Signed)
Physical Therapy Wound Treatment Patient Details  Name: Candace [name] MRN: [id number] Date of Birth: 1951-08-30  Today's Date: 11/02/2020 Time: 1204-1240 Time Calculation (min): 36 min  Subjective Semi-alert on vent. Responding to questions with head nods.  Patient and Family Stated Goals: Heal wound; maintain pt comfort per daughter Date of Onset:  (Unknown)  Pain Score:  Pt was premedicated and appeared to tolerate treatment well without any obvious signs of pain or distress.   Wound Assessment  Pressure Injury 09/25/20 Sacrum Bilateral;Medial Deep Tissue Pressure Injury - Purple or maroon localized area of discolored intact skin or blood-filled blister due to damage of underlying soft tissue from pressure and/or shear. Purple, non-blanchable, b (Active)  Dressing Type Moist to dry 11/01/20 2000  Dressing Clean;Intact;Dry 11/02/20 0800  Dressing Change Frequency Daily 11/01/20 2000  State of Healing Eschar 11/01/20 1700  Site / Wound Assessment Dressing in place / Unable to assess 11/02/20 0800  % Wound base Red or Granulating 20% 10/29/20 1143  % Wound base Yellow/Fibrinous Exudate 70% 10/29/20 1143  % Wound base Black/Eschar 10% 10/29/20 1143  % Wound base Other/Granulation Tissue (Comment) 0% 10/29/20 1143  Peri-wound Assessment Excoriated 11/01/20 1700  Wound Length (cm) 16 cm 10/30/20 1052  Wound Width (cm) 11 cm 10/30/20 1052  Wound Depth (cm) 5 cm 10/30/20 1052  Wound Surface Area (cm^2) 176 cm^2 10/30/20 1052  Wound Volume (cm^3) 880 cm^3 10/30/20 1052  Tunneling (cm) 0 10/13/20 1200  Undermining (cm) 0 10/13/20 1200  Margins Unattached edges (unapproximated) 11/01/20 1700  Drainage Amount Minimal 11/01/20 1700  Drainage Description Serosanguineous 11/01/20 1700  Treatment Cleansed;Packing (Saline gauze) 11/01/20 1700   Hydrotherapy Pulsed lavage therapy - wound location: sacrum Pulsed Lavage with Suction (psi): 12 psi Pulsed Lavage with Suction - Normal Saline Used:  1000 mL Pulsed Lavage Tip: Tip with splash shield Selective Debridement Selective Debridement - Location: sacrum Selective Debridement - Tools Used: Scalpel;Forceps Selective Debridement - Tissue Removed: necrotic adipose tissue and eschar   Wound Assessment and Plan  Wound Therapy - Assess/Plan/Recommendations Wound Therapy - Clinical Statement: Progressing with debridement of necrotic adipose and eschar. Daughter present throughout session via e-link and questions answered. Will continue to follow for selective removal of unviable tissue, to decrease bioburden and promote wound bed healing. Wound Therapy - Functional Problem List: Global weakness and immobility Factors Delaying/Impairing Wound Healing: Diabetes Mellitus;Immobility;Multiple medical problems Hydrotherapy Plan: Debridement;Dressing change;Patient/family education;Pulsatile lavage with suction Wound Therapy - Frequency: 6X / week Wound Therapy - Follow Up Recommendations: Skilled nursing facility Wound Plan: See above  Wound Therapy Goals- Improve the function of patient's integumentary system by progressing the wound(s) through the phases of wound healing (inflammation - proliferation - remodeling) by: Decrease Necrotic Tissue to: 20% Decrease Necrotic Tissue - Progress: Progressing toward goal Increase Granulation Tissue to: 80% Increase Granulation Tissue - Progress: Progressing toward goal Goals/treatment plan/discharge plan were made with and agreed upon by patient/family: No, Patient unable to participate in goals/treatment/discharge plan and family unavailable Time For Goal Achievement: 7 days Wound Therapy - Potential for Goals: Fair  Goals will be updated until maximal potential achieved or discharge criteria met.  Discharge criteria: when goals achieved, discharge from hospital, MD decision/surgical intervention, no progress towards goals, refusal/missing three consecutive treatments without notification or medical  reason.  GP     Thelma Comp 11/02/2020, 2:32 PM   Rolinda Roan, PT, DPT Acute Rehabilitation Services Pager: [phone] Office: [phone]

## 2020-11-02 NOTE — Progress Notes (Signed)
Around 0045 pts work of breathing, HR and RR have increased again despite giving PRN medications, suctioning and repositioning. [facility] contacted over concerns. New orders received.

## 2020-11-03 ENCOUNTER — Inpatient Hospital Stay (HOSPITAL_COMMUNITY): Payer: Medicare Other

## 2020-11-03 DIAGNOSIS — I4891 Unspecified atrial fibrillation: Secondary | ICD-10-CM | POA: Diagnosis not present

## 2020-11-03 DIAGNOSIS — J80 Acute respiratory distress syndrome: Secondary | ICD-10-CM | POA: Diagnosis not present

## 2020-11-03 DIAGNOSIS — R579 Shock, unspecified: Secondary | ICD-10-CM | POA: Diagnosis not present

## 2020-11-03 DIAGNOSIS — J9621 Acute and chronic respiratory failure with hypoxia: Secondary | ICD-10-CM | POA: Diagnosis not present

## 2020-11-03 LAB — GLUCOSE, CAPILLARY
Glucose-Capillary: 212 mg/dL — ABNORMAL HIGH (ref 70–99)
Glucose-Capillary: 152 mg/dL — ABNORMAL HIGH (ref 70–99)
Glucose-Capillary: 187 mg/dL — ABNORMAL HIGH (ref 70–99)
Glucose-Capillary: 151 mg/dL — ABNORMAL HIGH (ref 70–99)
Glucose-Capillary: 143 mg/dL — ABNORMAL HIGH (ref 70–99)
Glucose-Capillary: 200 mg/dL — ABNORMAL HIGH (ref 70–99)

## 2020-11-03 LAB — COMPREHENSIVE METABOLIC PANEL
ALT: 20 U/L (ref 0–44)
AST: 55 U/L — ABNORMAL HIGH (ref 15–41)
Albumin: 2.1 g/dL — ABNORMAL LOW (ref 3.5–5.0)
Alkaline Phosphatase: 278 U/L — ABNORMAL HIGH (ref 38–126)
Anion gap: 7 (ref 5–15)
BUN: 43 mg/dL — ABNORMAL HIGH (ref 8–23)
CO2: 24 mmol/L (ref 22–32)
Calcium: 10.9 mg/dL — ABNORMAL HIGH (ref 8.9–10.3)
Chloride: 100 mmol/L (ref 98–111)
Creatinine, Ser: 0.51 mg/dL (ref 0.44–1.00)
GFR, Estimated: >60 mL/min (ref >60)
Glucose, Bld: 245 mg/dL — ABNORMAL HIGH (ref 70–99)
Potassium: 4.6 mmol/L (ref 3.5–5.1)
Sodium: 131 mmol/L — ABNORMAL LOW (ref 135–145)
Total Bilirubin: 0.7 mg/dL (ref 0.3–1.2)
Total Protein: 9 g/dL — ABNORMAL HIGH (ref 6.5–8.1)

## 2020-11-03 LAB — BPAM RBC
Blood Product Expiration Date: 202201102359
ISSUE DATE / TIME: 202201040416
Unit Type and Rh: 1700

## 2020-11-03 LAB — TYPE AND SCREEN
ABO/RH(D): B POS
Antibody Screen: NEGATIVE
Unit division: 0

## 2020-11-03 LAB — CBC
HCT: 29.1 % — ABNORMAL LOW (ref 36.0–46.0)
Hemoglobin: 8.9 g/dL — ABNORMAL LOW (ref 12.0–15.0)
MCH: 28.3 pg (ref 26.0–34.0)
MCHC: 30.6 g/dL (ref 30.0–36.0)
MCV: 92.7 fL (ref 80.0–100.0)
Platelets: 182 10*3/uL (ref 150–400)
RBC: 3.14 MIL/uL — ABNORMAL LOW (ref 3.87–5.11)
RDW: 18 % — ABNORMAL HIGH (ref 11.5–15.5)
WBC: 23.3 10*3/uL — ABNORMAL HIGH (ref 4.0–10.5)
nRBC: 5.7 % — ABNORMAL HIGH (ref 0.0–0.2)

## 2020-11-03 LAB — MAGNESIUM: Magnesium: 2.5 mg/dL — ABNORMAL HIGH (ref 1.7–2.4)

## 2020-11-03 LAB — HEPARIN LEVEL (UNFRACTIONATED): Heparin Unfractionated: 0.59 IU/mL (ref 0.30–0.70)

## 2020-11-03 LAB — PHOSPHORUS: Phosphorus: 3.7 mg/dL (ref 2.5–4.6)

## 2020-11-03 MED ORDER — SODIUM CHLORIDE 0.9 % IV SOLN
500.0000 mg | INTRAVENOUS | Status: AC
  Administered 2020-11-03 – 2020-11-06 (×4): 500 mg via INTRAVENOUS
  Filled 2020-11-03 (×5): qty 0.5

## 2020-11-03 NOTE — Progress Notes (Addendum)
[facility] Progress Note   Assessment/ Plan:   # AKI- oliguric now likely anuric. ATN for shock.Started on CRRT 09/08/20 due to persistent oliguria/anuria and hyperkalemia.[place] placed on 10/19/20. -All fluids 4K/2.5Ca:  Continue to keep even. No anticoagulation through circuit (on systemic hep gtt) -Patient has failed multiple transitions to HD w/ low Bps requiring pressors -No signs of meaningful recovery from our team -Family expressed ongoing wishes for aggressive care -Ethics seen 12/30 and suggested time trial of 5-7 days CV while finishing treatment for PNA - Pressor requirement is down today.  Have discussed with PCCM/ Dr Hulen Skains that if we are to make the transition to IHD that this is an appropriate time.  Will plan on stopping CRRT today at 1700 (orders written) with a plan to transition to IHD tomorrow vs Friday as long as hemodynamics remain favorable.    # h/o hemorrhagic shock/ ABLA- ongoing blood lossTransfuse as needed, have added ESA.  Hgb 6.9 11/02/20, plan for 1 u pRBCs, hgb 8.9 this AM  # Septic shock: still requiring pressors, over max dose of midodrine, on meropenem empirically.  Repeat blood cultures 10/31/20 NGTD, most recent positive cultures MSSA 12/24.  # Perforated duodenal ulcer- s/p exploratory lap and Graham patch placement per surgery.  # Acute hypoxic/hypercapnic respiratory failure due to ARDS from Recent Covid-19 PNA- currently on vent via trach per PCCM. Weaning efforts have not been successful lately.    # Atrial fibrillation- on hep gtt  # Hyponatremia- worsened quickly off of CRRT-  manage with crrt.   # Severe protein malnutrition- per primary svc.   # Disposition- poor overall prognosis.  ADDEND: Discussed with pt's daughters over the phone and updated them on plan of care.  All questions answered.  They are in agreement of stopping CRRT at 1700 today and transitioning to IHD tomorrow/Friday.  Subjective:    Pressor  requirement down to 3 mcg levophed today.  D/w PCCM- will stop CRRT at 1700 today and will plan to transition to intermittent hemodialysis tomorrow.         Objective:   BP 99/62   Pulse 97   Temp (!) 96.5 F (35.8 C) (Axillary)   Resp (!) 32   Ht _0  (1.727 m)   Wt 75.6 kg   SpO2 100%   BMI 25.34 kg/m   Physical Exam: Gen: lying in bed. Sleeping today HEENT: trach in place CVS: RRR Resp: breathing is unlabored Abd: soft, nontender Ext: no LE edema, heels in protectors ACCESS: R [facility]  Labs: BMET Recent Labs  Lab 10/31/20 0653 10/31/20 1710 11/01/20 0330 11/01/20 1623 11/02/20 0220 11/02/20 1543 11/03/20 0518  NA 134* 133* 132* 130* 131* 134* 131*  K 5.0 5.0 5.0 4.6 4.5 4.3 4.6  CL 98 101 100 99 99 101 100  CO2 _1 GLUCOSE 214* 240* 270* 251* 272* 249* 245*  BUN 29* 31* 33* 45* 54* 45* 43*  CREATININE 0.58 0.57 0.52 0.56 0.58 0.55 0.51  CALCIUM 10.8* 11.0* 10.6* 9.8 9.9 10.4* 10.9*  PHOS 3.5 3.5 3.6 3.6 3.9 3.4 3.7   CBC Recent Labs  Lab 10/31/20 1018 11/01/20 0330 11/02/20 0220 11/02/20 1543 11/03/20 0518  WBC 23.1* 33.4* 35.9*  --  23.3*  NEUTROABS 16.5*  --   --   --   --   HGB 7.4* 7.1* 6.9* 8.8* 8.9*  HCT 25.5* 23.3* 23.3* 28.6* 29.1*  MCV 96.2 95.9 97.9  --  92.7  PLT 224 234 227  --  182      Medications:    . B-complex with vitamin C  1 tablet Per Tube Daily  . chlorhexidine gluconate (MEDLINE KIT)  15 mL Mouth Rinse BID  . Chlorhexidine Gluconate Cloth  6 each Topical Daily  . cholecalciferol  2,000 Units Per Tube Daily  . clonazepam  1 mg Per Tube TID  . darbepoetin (ARANESP) injection - DIALYSIS  100 mcg Subcutaneous Q Tue-1800  . docusate  100 mg Per Tube BID  . feeding supplement (PROSource TF)  90 mL Per Tube TID  . fentaNYL  1 patch Transdermal Q72H  . guaiFENesin  15 mL Per Tube Q12H  .  HYDROmorphone (DILAUDID) injection  1 mg Intravenous Q4H  . influenza vaccine adjuvanted  0.5 mL Intramuscular  Tomorrow-1000  . insulin aspart  0-15 Units Subcutaneous Q4H  . insulin aspart  14 Units Subcutaneous Q4H  . insulin glargine  25 Units Subcutaneous BID  . mouth rinse  15 mL Mouth Rinse 10 times per day  . midodrine  40 mg Per Tube TID WC  . nutrition supplement (JUVEN)  1 packet Per Tube BID BM  . pantoprazole sodium  40 mg Per Tube BID  . polyethylene glycol  17 g Per Tube Daily  . QUEtiapine  100 mg Per Tube BID  . sodium chloride flush  10-40 mL Intracatheter Q12H     Madelon Lips MD 11/03/2020, 10:54 AM

## 2020-11-03 NOTE — Progress Notes (Signed)
RT NOTE: PT double stacking on ventilator with RR in the 40's on FS. CCM aware and ordered Dilaudid. VSS. RT will continue to monitor.

## 2020-11-03 NOTE — Progress Notes (Signed)
ANTICOAGULATION CONSULT NOTE  Pharmacy Consult:  Heparin Indication: atrial fibrillation   Labs: Recent Labs    11/01/20 0330 11/01/20 1623 11/02/20 0220 11/02/20 1543 11/03/20 0518  HGB 7.1*  --  6.9* 8.8* 8.9*  HCT 23.3*  --  23.3* 28.6* 29.1*  PLT 234  --  227  --  182  HEPARINUNFRC 0.45  --  0.65  --  0.59  CREATININE 0.52   < > 0.58 0.55 0.51   < > = values in this interval not displayed.    Assessment: 70 yo female with history of Afib on apixaban prior to admission, which has been held and patient transitioned to IV heparin.   Heparin level remains therapeutic at 0.59 on heparin 1250 units/hr. There has been no further bleeding per RN. Hgb 8.9 s/p transfusion yesterday. Plt wnl.   Goal of Therapy:  Heparin level 0.3-0.5 units/ml   Plan:  Continue heparin 1250 units/hr Monitor heparin level, CBC and S/S of bleeding daily   Cristela Felt, PharmD Clinical Pharmacist  11/03/2020, 7:22 AM

## 2020-11-03 NOTE — Progress Notes (Signed)
Assisted tele visit to patient with daughter.  Dayshia Ballinas Anderson, RN   

## 2020-11-03 NOTE — Progress Notes (Signed)
Patient ID: Candace [name], female   DOB: August 21, 1951, 70 y.o.   MRN: [id number]  This NP reviewed medical records discussed case with bedside RN.  Attendings continue to address GOCs with family along with medical updates.   Ethics consult noted.   PMT will continue to shadow, will not engage unless specific need arises, or family request.   Please call with questions or concerns  No charge  Wadie Lessen NP  Palliative Medicine Team Team Phone # [phone]

## 2020-11-03 NOTE — Progress Notes (Signed)
NAMECynda [name], MRN:  [id number], DOB:  [birth year], LOS: 43 ADMISSION DATE:  08/31/2020, CONSULTATION DATE:  09/07/2020 REFERRING MD:  Dr Candiss Norse, CHIEF COMPLAINT:  Acute resp failure  Brief History   70 year old female who was previously diagnosed with Covid 08/23/2020.  Admitted 11/2 with AF-RVR, found to have a perforated duodenal underwent exploratory laparotomy with Phillip Heal patch placement 11/3.  Past Medical History  Covid pneumonia Atrial fibrillation CKD stage III Diabetes mellitus Hypertension Colon cancer Hyperlipidemia  Significant Hospital Events   11/2 Admitted  11/3 OR with findings of perforated duodenal ulcer 11/10 progressive hemorrhagic shock, intubated, transfused, pressors, proned; started on CRRT in PM 11/03 Exploratory laparotomy, Phillip Heal patch, lysis of adhesion for duodenal ulceration postop day 6 11/16 Extubated. Re-intubated overnight due to respiratory distress and hypoxia with decreased mentation 11/18 Bronch, cultures sent 11/19 Hgb down getting blood 11/24 Spiked fever resume empirical antimicrobial therapy 11/26 Hemorrhagic shock, hgb 5.6, increased pressors, CT A/P  11/30 Per palliative "[name] expresses understanding that patient is unfortunately very fragile despite ongoing intensive medical treatment and full mechanical support. She indicates that the family wants to continue with all current interventions despite potential outcomes". 12/08 CRRT discontinued due to clotting 12/09 Family requested transfer to tertiary care [facility]). [facility] denied transfer  12/10 CRRT restarted. Episodes of tachycardia, tachypnea that seem to improve with pain management 12/11 Back in shock. Pressor requirements up. CXR worse. ABX resumed 12/12 Still requiring inc pressors. Had hypoglycemic event. Basal insulin dosing adjusted 12/13 Pressor requirements better. Now hyperglycemic. Re-adjusted Glycemic control  12/14 Changed dilaudid to1/2 dosing from day further. D/c  vasopressin.  Goals of care reconfirmed with daughter.  Patient continues to desire aggressive care.  Not open to discussing any other option, patient family continues to be hopeful that she will be discharged to home with full recovery in spite of multiple attempts by staff to prepare them that this is unlikely scenario 12/15 Dilaudid discontinued sending cortisol for ongoing pressor dependence 12/16 Serum cortisol <20, added stress dose steroids.  PRN Dilaudid, attempting not go back on Dilaudid infusion 12/17 Developed worsening tachycardia during the evening hours received initially IV albumin, followed by resuming IV Dilaudid with question of suboptimal pain control.  Currently looks better back on Dilaudid drip.  We have been able to wean pressors after adding stress dose steroids; near arrest - bradycardia, better with atropine  12/19 Afebrile . Remains on dilaudid and heparin gtt, dilaudid gtt increased overnight for concern of pain / ongoing tachycardia, no other events . NE and precedex off 12/17. Ongoing CRRT- even UF, dosing lokelmia this morning 12/20 On CRRT.  Renal plans for HD cath and moving to HD. Getting wound care  12/23 -No vent weaning per RT, remains on full support, #8 trach in place but previously had #6, no documentation of change noted in chart Afebrile / WBC 20.4  Vent - 30% FiO2, PEEP 5 Glucose range 176-212 I/O 465 ml stool, 3.6L removed with HD, -1.1L in last 24 hours  RN reports ongoing periods of tachypnea / vent dyssynchrony that responds to dilaudid   12/24 - renal stopping CRRT today and plans HD 10/24/20 . 40% fio2 on vent via Trach. TAchypenic and tachycardic. Afebrile but wbc up to 27.6K. On TF. Onn heparin gtt  12/25 - Back on CRRT. On vent via trach at 40% fio2, On scheduled dilaudid as add on to oxy. Per family request 12/24 - they felt scheduled oxy was not adequate and patient was showing  signs of opioid withdrawal.  Patient also had  worsening SIRS/sepsis  syndrome. Had fever, rising wbc, worsening RR 40-60 and HR 140s sinus-> started On abx yesterday. Fever some better today. WBC plateau at 28,.5K. On new levophed -> signifanct improvement in HR 77 and RR t0 20. On heparin gtt.  On precedex gtt. On levophed gtt 17mcg wthi midodrine. On TF 12/30 Remains on CRRT with intermittent pressor requirements. Ethics consult placed evening of 12/29. Ethics rec time trial of CRRT 12/31 failed SBT with RR 40s. Several conversations between care tam and family, who are upset RE plan of care 1/1 back on pressors  1/4: On pressors, keeping even on CVVHD, HGB drop to 6.9, transfused 1 unit. Improving mental status  Consults:  Cardiology [facility] Nephrology  Ethics  Procedures:  R PICC 11/5 >> A line 11/9 >> out ETT 11/9 > 11/16, 11/16 >> 09/21/2020, 09/21/2020 tracheostomy>> Lt [place] CVL 11/9 >> R IJ trialysis >> out HD catheter 12/1 >>12/20 12/21 - 14.5 Fr, 23 cm right IJ tunneled hemodialysis catheter placement.  Removal of indwelling subclavian catheter.   Significant Diagnostic Tests:  11/3 CT abd/ pelvis > 1. Positive for bowel perforation: Pneumoperitoneum and intermediate density free fluid in the abdomen. Prior total colectomy. The specific site of perforation is unclear-oral contrast present to the proximal jejunum has not obviously leaked. Note that there may be small bowel loops adherent to the ventral abdominal wall along the greater curve of the stomach. 2. Extensive bilateral lower lung pneumonia. No pleural effusion. 3. Other abdominal and pelvic viscera are stable since 2015, including bilateral adrenal adenomas. Chronic renal parapelvic cysts. 4. Aortic Atherosclerosis 11/3 TTE > EF 70-75%, RV not well visualized, mildly reduced RV systolic function 96/78 CT chest/ abd/ pelvis> 1. Interval progression of diffuse bilateral hazy ground-glass airspace opacities with more focal areas of consolidation at the lung bases 2. Trace bilateral pleural  effusions. 3. Postsurgical changes the abdomen as detailed above. No evidence for a postoperative abscess, however evaluation is limited by lack of IV contrast. 4. There is a 1.9 cm cystic appearing lesion located in the pancreatic body. This was not present on the patient's CT from 2015.  Follow-up with an outpatient contrast enhanced MRI is recommended. 5. The endometrial stripe appears diffusely thickened. Follow-up with pelvic ultrasound is recommended. Aortic Atherosclerosis 11/14 LE doppler studies > + DVT of right posterior tibial and peroneal vein, +dVT of left posterior tibial vein  11/26 CT ABD PEL > liver unremarkable, distended gallbladder with layering tiny gallstones versus sludge, no duct dilatation, mild hyperdensity right upper pole renal collecting system new.  No evidence of retroperitoneal bleeding.  Multifocal lower lobe predominant pulmonary infiltrates/pneumonia.  Small left pleural effusion.  Micro Data:  11/10 MRSA PCR > neg 11/10 BC x 2 > neg 09/22/2020 blood cultures x2>> S epi.  09/22/2020 sputum culture>> MRSA Blood 12/1 >> negative. S.epi 1 out of 2, likely contaminant BCx 2 12/12 >> neg xxx Trach aspirate 12/24 - FEW STAPHYLOCOCCUS AUREUS  Blood 12/24 > Negative  Blood 1/2: Sputum 1/2:  Antimicrobials:  azithro 11/2 >11/3 Ceftriaxone 11/2  Fluconazole 11/3 Zosyn 11/3 >> 11/7 Vanc 11/10 off Cefepime 11/10 > 11/16 09/22/2020 vancomycin for MRSA PNA >> 12/2 xxxx Vanc 12/11> 12/17 Zosyn 12/11> 12/17 xxxx vanc 12/24 (staph resp cutlure) >> 12/28 Zosyn 12/24 - 12/27 Cefazolin 12/27 >>1/1 linezolid 1/2 merrem 1/2 eraxis 1/2   Subjective:   This morning on lower doses or norepinephrine. Failed SBT for tachypnea.  Objective   Blood pressure (!) 89/56, pulse (!) 115, temperature 97.9 F (36.6 C), temperature source Axillary, resp. rate (!) 31, height 5\' 8"  (1.727 m), weight 75.6 kg, SpO2 97 %.    Vent Mode: PRVC FiO2 (%):  [40 %] 40 % Set Rate:   [28 bmp] 28 bmp Vt Set:  [450 mL] 450 mL PEEP:  [5 cmH20] 5 cmH20 Pressure Support:  [10 cmH20] 10 cmH20 Plateau Pressure:  [28 cmH20] 28 cmH20   Intake/Output Summary (Last 24 hours) at 11/03/2020 1311 Last data filed at 11/03/2020 1300 Gross per 24 hour  Intake 3674.89 ml  Output 2712 ml  Net 962.89 ml   Filed Weights   11/01/20 0346 11/02/20 0500 11/03/20 0452  Weight: 75.1 kg 75.7 kg 75.6 kg   Physical Exam  General: Chronically and critically ill debilitated F, upright  in bed, nods head to questions, flat affect HEENT: trach secure. Anicteric sclera. Neuro: Opens eyes, nods head to yes no questions.  CV: s1s2 no rgm cap refill < 3 seconds  PULM:  Bilateral rhonchi, remains vent dependent with mild tachypnea , overbreathing vent GI: soft round ndnt., stoma is pink with tan stool noted, and dressing is clean dry and intact  Extremities: Decreased muscle tone BUE BLE. BLE boots.  Skin: c/d/w. Sacral wound.   Resolved problems:  Previously hemorrhagic shock requiring PRBCs, Septic shock, MRSA pneumonia s/p 8 days abx Complicated by MRSA healthcare associated pneumonia (treated) Peritonitis and perforated ulcer post repair 09/01/20 MSSA HAP  Assessment:  Candace [name] is a 70 y.o. with history of COVId 19 infection requiring hospitalization in October 2021 with current admission for hemorrhagic shock secondary to perforated gastric ulcer requiring surgical intervention and [place]. Her medical issues are as follows:  Acute metabolic encephalopathy Prolonged critical illness, sedation/pain - wean precedex as able - mental status waxes and wanes.  - dilaudid, change to fentanyl patch, seroquel, clonazepam   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 status post tracheostomy Stable bilateral pneumonia VDRF -VAP, pulm hygiene - chest xray obtained this morning - no significant change. - CXR prn  - continue vent wean efforts  daily  Persistent vasoplegia vs circulatory shock -over 60 days -- big picture, long term pressors is not sustainable  -on max dose midodrine -PRN pressors>> currently is on Norepi  at 3 mcg  Renal failure on CRRT  -over 60 days-- big picture, long term crrt not sustainable -Nephro following -appreciate ethics.  -have personally had multiple conversations with daughter that the vent is not the issue for life sustaining the repeat infections and inability to wean from crrt is.  - discussed with nephrology and family re: CRRT holiday since her pressor requirement is lower and her Hgb is better. May be able to rechallenge for HD when needed from a volume or electrolyte standpoint.    Afib/DVT HGB drop 1/4 to 6.9>> received 1 unit PRBC No obvious bleeding - continue  heparin drip per pharmacy - Monitor for bleeding/ CBC daily - Will recheck CBC  with 4 pm renal panel 1/4 - intermittent rate control  Critical Illness Myopathy Severe deconditioning  Inadequate PO intake  - PT following - EN per RDN  DM2 with hyperglycemia -changing insulin reg for better control - Will continue lantus split into BID dosing. - Will continue Q 4 coverage to 14 units  Stage IV sacral ulcer -WOC, hydrotherapy, dakins   History of stage IV colon cancer 1.9 cm cystic  appearing lesion located in the pancreatic body  [place] -multiple [place] conversations with various PCCM providers and family have not yielded changes in scope of tx, multiple PCCM providers have been fired by family.  -concerns for futility of certain interventions -ethics consulted 12/30-- recs for time limited trial of CRRT, please see separate Ethics consult note for full details. Futility policy reviewed with family by ethics. Ongoing meetings with Dr. Hulen Skains with nursing administration and family. The ultimate goal is to transition her safely to HD from CRRT which has proven difficult thus far.   Best practice:  Diet: EN   Pain/Anxiety/Delirium protocol (if indicated): titrating.  VAP protocol (if indicated): yes DVT prophylaxis: IV heparin GI prophylaxis: Protonix Glucose control: SSI, TF coverage, lantus Mobility: PT Family communication: updated 11/04/19 by Dr. Shearon Stalls.  The patient is critically ill with multiple organ systems failure and requires high complexity decision making for assessment and support, frequent evaluation and titration of therapies, application of advanced monitoring technologies and extensive interpretation of multiple databases.   Critical Care Time devoted to patient care services described in this note is 53 minutes. This time reflects time of care of this [facility] . This critical care time does not reflect separately billable procedures or procedure time, teaching time or supervisory time of PA/NP/Med student/Med Resident etc but could involve care discussion time.  Lenice Llamas, MD Pulmonary and [place] Pager: [place]

## 2020-11-03 NOTE — Progress Notes (Signed)
PT Cancellation Note  Patient Details Name: Candace [name] MRN: [id number] DOB: 1950/11/08   Cancelled Treatment:    Reason Eval/Treat Not Completed: Medical issues which prohibited therapy. Discussed pt case with RN who asks that PT hold hydrotherapy at 1130. Reviewed chart again at 1315 and noted last RT note. Will check back tomorrow for appropriateness to participate in hydrotherapy.    Thelma Comp 11/03/2020, 1:20 PM   Rolinda Roan, PT, DPT Acute Rehabilitation Services Pager: [phone] Office: [phone]

## 2020-11-03 NOTE — Progress Notes (Signed)
PHARMACY NOTE:  ANTIMICROBIAL RENAL DOSAGE ADJUSTMENT  Current antimicrobial regimen includes a mismatch between antimicrobial dosage and estimated renal function.  As per policy approved by the Pharmacy & Therapeutics and Medical Executive Committees, the antimicrobial dosage will be adjusted accordingly.  Current antimicrobial dosage:  Meropenem 1g q8h   Indication: PNA  Renal Function: Currently on CRRT with plan stop CRRT today at 1700 and transition to iHD     Antimicrobial dosage has been changed to:  Meropenem 500mg  q24h   Additional comments:   Thank you for allowing pharmacy to be a part of this patient's care.  Cristela Felt, PharmD Clinical Pharmacist  11/03/2020 9:46 AM

## 2020-11-04 DIAGNOSIS — E875 Hyperkalemia: Secondary | ICD-10-CM

## 2020-11-04 DIAGNOSIS — J9621 Acute and chronic respiratory failure with hypoxia: Secondary | ICD-10-CM | POA: Diagnosis not present

## 2020-11-04 DIAGNOSIS — J80 Acute respiratory distress syndrome: Secondary | ICD-10-CM | POA: Diagnosis not present

## 2020-11-04 DIAGNOSIS — N179 Acute kidney failure, unspecified: Secondary | ICD-10-CM | POA: Diagnosis not present

## 2020-11-04 DIAGNOSIS — I4891 Unspecified atrial fibrillation: Secondary | ICD-10-CM | POA: Diagnosis not present

## 2020-11-04 LAB — CBC
HCT: 27.1 % — ABNORMAL LOW (ref 36.0–46.0)
Hemoglobin: 8.6 g/dL — ABNORMAL LOW (ref 12.0–15.0)
MCH: 29.5 pg (ref 26.0–34.0)
MCHC: 31.7 g/dL (ref 30.0–36.0)
MCV: 92.8 fL (ref 80.0–100.0)
Platelets: 203 10*3/uL (ref 150–400)
RBC: 2.92 MIL/uL — ABNORMAL LOW (ref 3.87–5.11)
RDW: 17.6 % — ABNORMAL HIGH (ref 11.5–15.5)
WBC: 23.1 10*3/uL — ABNORMAL HIGH (ref 4.0–10.5)
nRBC: 8.1 % — ABNORMAL HIGH (ref 0.0–0.2)

## 2020-11-04 LAB — RENAL FUNCTION PANEL
Albumin: 2 g/dL — ABNORMAL LOW (ref 3.5–5.0)
Albumin: 2.6 g/dL — ABNORMAL LOW (ref 3.5–5.0)
Anion gap: 10 (ref 5–15)
Anion gap: 11 (ref 5–15)
BUN: 90 mg/dL — ABNORMAL HIGH (ref 8–23)
BUN: 57 mg/dL — ABNORMAL HIGH (ref 8–23)
CO2: 21 mmol/L — ABNORMAL LOW (ref 22–32)
CO2: 24 mmol/L (ref 22–32)
Calcium: 11.5 mg/dL — ABNORMAL HIGH (ref 8.9–10.3)
Calcium: 9.8 mg/dL (ref 8.9–10.3)
Chloride: 104 mmol/L (ref 98–111)
Chloride: 99 mmol/L (ref 98–111)
Creatinine, Ser: 0.93 mg/dL (ref 0.44–1.00)
Creatinine, Ser: 0.64 mg/dL (ref 0.44–1.00)
GFR, Estimated: >60 mL/min (ref >60)
GFR, Estimated: >60 mL/min (ref >60)
Glucose, Bld: 118 mg/dL — ABNORMAL HIGH (ref 70–99)
Glucose, Bld: 117 mg/dL — ABNORMAL HIGH (ref 70–99)
Phosphorus: 5.8 mg/dL — ABNORMAL HIGH (ref 2.5–4.6)
Phosphorus: 3.5 mg/dL (ref 2.5–4.6)
Potassium: 6.1 mmol/L — ABNORMAL HIGH (ref 3.5–5.1)
Potassium: 3.7 mmol/L (ref 3.5–5.1)
Sodium: 135 mmol/L (ref 135–145)
Sodium: 134 mmol/L — ABNORMAL LOW (ref 135–145)

## 2020-11-04 LAB — MAGNESIUM: Magnesium: 2.7 mg/dL — ABNORMAL HIGH (ref 1.7–2.4)

## 2020-11-04 LAB — GLUCOSE, CAPILLARY
Glucose-Capillary: 101 mg/dL — ABNORMAL HIGH (ref 70–99)
Glucose-Capillary: 112 mg/dL — ABNORMAL HIGH (ref 70–99)
Glucose-Capillary: 118 mg/dL — ABNORMAL HIGH (ref 70–99)
Glucose-Capillary: 167 mg/dL — ABNORMAL HIGH (ref 70–99)
Glucose-Capillary: 173 mg/dL — ABNORMAL HIGH (ref 70–99)
Glucose-Capillary: 90 mg/dL (ref 70–99)

## 2020-11-04 LAB — HEPATITIS B SURFACE ANTIGEN: Hepatitis B Surface Ag: NONREACTIVE

## 2020-11-04 LAB — HEPARIN LEVEL (UNFRACTIONATED): Heparin Unfractionated: 0.57 IU/mL (ref 0.30–0.70)

## 2020-11-04 LAB — VITAMIN D 25 HYDROXY (VIT D DEFICIENCY, FRACTURES): Vit D, 25-Hydroxy: 36.3 ng/mL (ref 30–100)

## 2020-11-04 LAB — HEPATITIS B CORE ANTIBODY, IGM: Hep B C IgM: NONREACTIVE

## 2020-11-04 MED ORDER — CHLORHEXIDINE GLUCONATE CLOTH 2 % EX PADS
6.0000 | MEDICATED_PAD | Freq: Every day | CUTANEOUS | Status: DC
  Administered 2020-11-05: 6 via TOPICAL

## 2020-11-04 MED ORDER — ALBUMIN HUMAN 25 % IV SOLN
12.5000 g | Freq: Once | INTRAVENOUS | Status: AC
  Administered 2020-11-04: 12.5 g via INTRAVENOUS

## 2020-11-04 MED ORDER — SODIUM CHLORIDE 0.9 % IV SOLN: 100.0000 mL | INTRAVENOUS | Status: DC | PRN

## 2020-11-04 MED ORDER — PENTAFLUOROPROP-TETRAFLUOROETH EX AERO: 1.0000 "application " | INHALATION_SPRAY | CUTANEOUS | Status: DC | PRN

## 2020-11-04 MED ORDER — HEPARIN SODIUM (PORCINE) 1000 UNIT/ML DIALYSIS
1000.0000 [IU] | INTRAMUSCULAR | Status: DC | PRN
  Administered 2020-11-04: 1000 [IU] via INTRAVENOUS_CENTRAL
  Filled 2020-11-04: qty 1

## 2020-11-04 MED ORDER — LIDOCAINE HCL (PF) 1 % IJ SOLN: 5.0000 mL | INTRAMUSCULAR | Status: DC | PRN

## 2020-11-04 MED ORDER — ALBUMIN HUMAN 25 % IV SOLN: 12.5000 g | Freq: Once | INTRAVENOUS | Status: DC

## 2020-11-04 MED ORDER — LIDOCAINE-PRILOCAINE 2.5-2.5 % EX CREA: 1.0000 "application " | TOPICAL_CREAM | CUTANEOUS | Status: DC | PRN

## 2020-11-04 MED ORDER — ALBUMIN HUMAN 25 % IV SOLN
12.5000 g | INTRAVENOUS | Status: DC | PRN
  Administered 2020-11-04: 12.5 g via INTRAVENOUS

## 2020-11-04 MED ORDER — SODIUM ZIRCONIUM CYCLOSILICATE 5 G PO PACK
10.0000 g | PACK | Freq: Once | ORAL | Status: AC
  Administered 2020-11-04: 10 g
  Filled 2020-11-04: qty 2

## 2020-11-04 MED ORDER — ALTEPLASE 2 MG IJ SOLR
2.0000 mg | Freq: Once | INTRAMUSCULAR | Status: DC | PRN
  Filled 2020-11-04: qty 2

## 2020-11-04 NOTE — Progress Notes (Signed)
eLink Physician-Brief Progress Note Patient Name: Candace [name] DOB: Jun 24, 1951 MRN: [id number]   Date of Service  11/04/2020  HPI/Events of Note  Hyperkalemia - K+ = 6.1.   eICU Interventions  Plan: 1. Lokelma 10 gm per tube now. 2. Repeat BMP at 12 noon.     Intervention Category Major Interventions: Electrolyte abnormality - evaluation and management  Hanaan Gancarz Eugene 11/04/2020, 5:07 AM

## 2020-11-04 NOTE — Progress Notes (Addendum)
[facility] Progress Note   Assessment/ Plan:   # AKI- oliguric now likely anuric. ATN for shock.Started on CRRT 09/08/20 due to persistent oliguria/anuria and hyperkalemia.[place] placed on 10/19/20. -Patient has previously failed multiple transitions to HD w/ low Bps requiring pressors -No signs of meaningful recovery from our team -Family expressed ongoing wishes for aggressive care -Ethics seen 12/30 and suggested time trial of 5-7 days CV while finishing treatment for PNA - CRRT stopped 11/03/20 at 1700 - giving a dose of Lokelma this AM - hemodynamics are acceptable for transition to IHD today with conservative BFR/DFR and no UF--> have discussed with PCCM and with pt's dtrs and they are in agreement.  .    # h/o hemorrhagic shock/ ABLA- ongoing blood lossTransfuse as needed, have added ESA.  Hgb 6.9 11/02/20, plan for 1 u pRBCs, hgb 8.9--> 8.6.    # Septic shock: still requiring low-dose pressors, over max dose of midodrine, on meropenem empirically.  Repeat blood cultures 10/31/20 NGTD, most recent positive cultures MSSA 12/24.  # Perforated duodenal ulcer- s/p exploratory lap and [place] patch placement per surgery.  # Acute hypoxic/hypercapnic respiratory failure due to ARDS from Recent Covid-19 PNA- currently on vent via trach per PCCM. Weaning efforts have been slow progress.  # Atrial fibrillation- on hep gtt  # Hyponatremia- worsened quickly off of CRRT-  manage with crrt.   # Severe protein malnutrition- per primary svc.   #Hypercalcemia: likely of immobility, will check PTH and Vit D  # Disposition- poor overall prognosis.    Subjective:    Plan discussed with Dtrs [name] and Constance both (yesterday 11/03/20) and with [name] at bedside this AM (11/04/20).  Pt hemodynamics are still on par with what they were yesterday.  For attempt at IHD today.  K 6.1, lokelma given this AM.         Objective:   BP (!) 108/57   Pulse 91   Temp 99.8 F (37.7 C)  (Axillary)   Resp (!) 37   Ht 5' 8" (1.727 m)   Wt 76.5 kg   SpO2 100%   BMI 25.64 kg/m   Physical Exam: Gen: lying in bed. Appears comfortable HEENT: trach in place CVS: RRR Resp: breathing is unlabored Abd: soft, nontender Ext: no LE edema, heels in protectors ACCESS: R IJ TDC  Labs: BMET Recent Labs  Lab 10/31/20 1710 11/01/20 0330 11/01/20 1623 11/02/20 0220 11/02/20 1543 11/03/20 0518 11/04/20 0338  NA 133* 132* 130* 131* 134* 131* 135  K 5.0 5.0 4.6 4.5 4.3 4.6 6.1*  CL 101 100 99 99 101 100 104  CO2 _0 21*  GLUCOSE 240* 270* 251* 272* 249* 245* 118*  BUN 31* 33* 45* 54* 45* 43* 90*  CREATININE 0.57 0.52 0.56 0.58 0.55 0.51 0.93  CALCIUM 11.0* 10.6* 9.8 9.9 10.4* 10.9* 11.5*  PHOS 3.5 3.6 3.6 3.9 3.4 3.7 5.8*   CBC Recent Labs  Lab 10/31/20 1018 11/01/20 0330 11/02/20 0220 11/02/20 1543 11/03/20 0518 11/04/20 0338  WBC 23.1* 33.4* 35.9*  --  23.3* 23.1*  NEUTROABS 16.5*  --   --   --   --   --   HGB 7.4* 7.1* 6.9* 8.8* 8.9* 8.6*  HCT 25.5* 23.3* 23.3* 28.6* 29.1* 27.1*  MCV 96.2 95.9 97.9  --  92.7 92.8  PLT 224 234 227  --  182 203      Medications:    . B-complex with vitamin C  1 tablet Per Tube Daily  . chlorhexidine gluconate (MEDLINE KIT)  15 mL Mouth Rinse BID  . Chlorhexidine Gluconate Cloth  6 each Topical Daily  . Chlorhexidine Gluconate Cloth  6 each Topical Q0600  . cholecalciferol  2,000 Units Per Tube Daily  . clonazepam  1 mg Per Tube TID  . darbepoetin (ARANESP) injection - DIALYSIS  100 mcg Subcutaneous Q Tue-1800  . docusate  100 mg Per Tube BID  . feeding supplement (PROSource TF)  90 mL Per Tube TID  . fentaNYL  1 patch Transdermal Q72H  . guaiFENesin  15 mL Per Tube Q12H  .  HYDROmorphone (DILAUDID) injection  1 mg Intravenous Q4H  . influenza vaccine adjuvanted  0.5 mL Intramuscular Tomorrow-1000  . insulin aspart  0-15 Units Subcutaneous Q4H  . insulin aspart  14 Units Subcutaneous Q4H  . insulin  glargine  25 Units Subcutaneous BID  . mouth rinse  15 mL Mouth Rinse 10 times per day  . midodrine  40 mg Per Tube TID WC  . nutrition supplement (JUVEN)  1 packet Per Tube BID BM  . pantoprazole sodium  40 mg Per Tube BID  . polyethylene glycol  17 g Per Tube Daily  . QUEtiapine  100 mg Per Tube BID  . sodium chloride flush  10-40 mL Intracatheter Q12H       MD 11/04/2020, 12:16 PM   

## 2020-11-04 NOTE — Progress Notes (Signed)
ANTICOAGULATION CONSULT NOTE  Pharmacy Consult:  Heparin Indication: atrial fibrillation   Labs: Recent Labs    11/02/20 0220 11/02/20 1543 11/03/20 0518 11/04/20 0338  HGB 6.9* 8.8* 8.9* 8.6*  HCT 23.3* 28.6* 29.1* 27.1*  PLT 227  --  182 203  HEPARINUNFRC 0.65  --  0.59 0.57  CREATININE 0.58 0.55 0.51 0.93    Assessment: 70 yo female with history of Afib on apixaban prior to admission, which has been held and patient transitioned to IV heparin.   Heparin level remains therapeutic at 0.57 on heparin 1250 units/hr. There has been no further bleeding per RN. Hgb 8.6. Plt wnl.   Goal of Therapy:  Heparin level 0.3-0.5 units/ml   Plan:  Continue heparin 1250 units/hr Monitor heparin level, CBC and S/S of bleeding daily   Cristela Felt, PharmD Clinical Pharmacist  11/04/2020, 6:51 AM

## 2020-11-04 NOTE — Progress Notes (Signed)
NAMEKandee [name], MRN:  [id number], DOB:  08/28/1951, LOS: 2 ADMISSION DATE:  08/31/2020, CONSULTATION DATE:  09/07/2020 REFERRING MD:  Dr Candiss Norse, CHIEF COMPLAINT:  Acute resp failure  Brief History   70 year old female who was previously diagnosed with Covid 08/23/2020.  Admitted 11/2 with AF-RVR, found to have a perforated duodenal underwent exploratory laparotomy with Phillip Heal patch placement 11/3.  Past Medical History  Covid pneumonia Atrial fibrillation CKD stage III Diabetes mellitus Hypertension Colon cancer Hyperlipidemia  Significant Hospital Events   11/2 Admitted  11/3 OR with findings of perforated duodenal ulcer 11/10 progressive hemorrhagic shock, intubated, transfused, pressors, proned; started on CRRT in PM 11/03 Exploratory laparotomy, Phillip Heal patch, lysis of adhesion for duodenal ulceration postop day 6 11/16 Extubated. Re-intubated overnight due to respiratory distress and hypoxia with decreased mentation 11/18 Bronch, cultures sent 11/19 Hgb down getting blood 11/24 Spiked fever resume empirical antimicrobial therapy 11/26 Hemorrhagic shock, hgb 5.6, increased pressors, CT A/P  11/30 Per palliative "[name] expresses understanding that patient is unfortunately very fragile despite ongoing intensive medical treatment and full mechanical support. She indicates that the family wants to continue with all current interventions despite potential outcomes". 12/08 CRRT discontinued due to clotting 12/09 Family requested transfer to tertiary care [facility] denied transfer  12/10 CRRT restarted. Episodes of tachycardia, tachypnea that seem to improve with pain management 12/11 Back in shock. Pressor requirements up. CXR worse. ABX resumed 12/12 Still requiring inc pressors. Had hypoglycemic event. Basal insulin dosing adjusted 12/13 Pressor requirements better. Now hyperglycemic. Re-adjusted Glycemic control  12/14 Changed dilaudid to1/2 dosing from day further. D/c  vasopressin.  Goals of care reconfirmed with daughter.  Patient continues to desire aggressive care.  Not open to discussing any other option, patient family continues to be hopeful that she will be discharged to home with full recovery in spite of multiple attempts by staff to prepare them that this is unlikely scenario 12/15 Dilaudid discontinued sending cortisol for ongoing pressor dependence 12/16 Serum cortisol <20, added stress dose steroids.  PRN Dilaudid, attempting not go back on Dilaudid infusion 12/17 Developed worsening tachycardia during the evening hours received initially IV albumin, followed by resuming IV Dilaudid with question of suboptimal pain control.  Currently looks better back on Dilaudid drip.  We have been able to wean pressors after adding stress dose steroids; near arrest - bradycardia, better with atropine  12/19 Afebrile . Remains on dilaudid and heparin gtt, dilaudid gtt increased overnight for concern of pain / ongoing tachycardia, no other events . NE and precedex off 12/17. Ongoing CRRT- even UF, dosing lokelmia this morning 12/20 On CRRT.  Renal plans for HD cath and moving to HD. Getting wound care  12/23 -No vent weaning per RT, remains on full support, #8 trach in place but previously had #6, no documentation of change noted in chart Afebrile / WBC 20.4  Vent - 30% FiO2, PEEP 5 Glucose range 176-212 I/O 465 ml stool, 3.6L removed with HD, -1.1L in last 24 hours  RN reports ongoing periods of tachypnea / vent dyssynchrony that responds to dilaudid   12/24 - renal stopping CRRT today and plans HD 10/24/20 . 40% fio2 on vent via Trach. TAchypenic and tachycardic. Afebrile but wbc up to 27.6K. On TF. Onn heparin gtt  12/25 - Back on CRRT. On vent via trach at 40% fio2, On scheduled dilaudid as add on to oxy. Per family request 12/24 - they felt scheduled oxy was not adequate and patient was showing  signs of opioid withdrawal.  Patient also had  worsening SIRS/sepsis  syndrome. Had fever, rising wbc, worsening RR 40-60 and HR 140s sinus-> started On abx yesterday. Fever some better today. WBC plateau at 28,.5K. On new levophed -> signifanct improvement in HR 77 and RR t0 20. On heparin gtt.  On precedex gtt. On levophed gtt 68mcg wthi midodrine. On TF 12/30 Remains on CRRT with intermittent pressor requirements. Ethics consult placed evening of 12/29. Ethics rec time trial of CRRT 12/31 failed SBT with RR 40s. Several conversations between care tam and family, who are upset RE plan of care 1/1 back on pressors  1/4: On pressors, keeping even on CVVHD, HGB drop to 6.9, transfused 1 unit. Improving mental status  Consults:  Cardiology [facility] Nephrology  Ethics  Procedures:  R PICC 11/5 >> A line 11/9 >> out ETT 11/9 > 11/16, 11/16 >> 09/21/2020, 09/21/2020 tracheostomy>> Lt [place] CVL 11/9 >> R IJ trialysis >> out HD catheter 12/1 >>12/20 12/21 - 14.5 Fr, 23 cm right IJ tunneled hemodialysis catheter placement.  Removal of indwelling subclavian catheter.   Significant Diagnostic Tests:  11/3 CT abd/ pelvis > 1. Positive for bowel perforation: Pneumoperitoneum and intermediate density free fluid in the abdomen. Prior total colectomy. The specific site of perforation is unclear-oral contrast present to the proximal jejunum has not obviously leaked. Note that there may be small bowel loops adherent to the ventral abdominal wall along the greater curve of the stomach. 2. Extensive bilateral lower lung pneumonia. No pleural effusion. 3. Other abdominal and pelvic viscera are stable since 2015, including bilateral adrenal adenomas. Chronic renal parapelvic cysts. 4. Aortic Atherosclerosis 11/3 TTE > EF 70-75%, RV not well visualized, mildly reduced RV systolic function 17/49 CT chest/ abd/ pelvis> 1. Interval progression of diffuse bilateral hazy ground-glass airspace opacities with more focal areas of consolidation at the lung bases 2. Trace bilateral pleural  effusions. 3. Postsurgical changes the abdomen as detailed above. No evidence for a postoperative abscess, however evaluation is limited by lack of IV contrast. 4. There is a 1.9 cm cystic appearing lesion located in the pancreatic body. This was not present on the patient's CT from 2015.  Follow-up with an outpatient contrast enhanced MRI is recommended. 5. The endometrial stripe appears diffusely thickened. Follow-up with pelvic ultrasound is recommended. Aortic Atherosclerosis 11/14 LE doppler studies > + DVT of right posterior tibial and peroneal vein, +dVT of left posterior tibial vein  11/26 CT ABD PEL > liver unremarkable, distended gallbladder with layering tiny gallstones versus sludge, no duct dilatation, mild hyperdensity right upper pole renal collecting system new.  No evidence of retroperitoneal bleeding.  Multifocal lower lobe predominant pulmonary infiltrates/pneumonia.  Small left pleural effusion.  Micro Data:  11/10 MRSA PCR > neg 11/10 BC x 2 > neg 09/22/2020 blood cultures x2>> S epi.  09/22/2020 sputum culture>> MRSA Blood 12/1 >> negative. S.epi 1 out of 2, likely contaminant BCx 2 12/12 >> neg xxx Trach aspirate 12/24 - FEW STAPHYLOCOCCUS AUREUS  Blood 12/24 > Negative  Blood 1/2: Sputum 1/2:  Antimicrobials:  azithro 11/2 >11/3 Ceftriaxone 11/2  Fluconazole 11/3 Zosyn 11/3 >> 11/7 Vanc 11/10 off Cefepime 11/10 > 11/16 09/22/2020 vancomycin for MRSA PNA >> 12/2 xxxx Vanc 12/11> 12/17 Zosyn 12/11> 12/17 xxxx vanc 12/24 (staph resp cutlure) >> 12/28 Zosyn 12/24 - 12/27 Cefazolin 12/27 >>1/1 linezolid 1/2 merrem 1/2 eraxis 1/2   Subjective:   This morning on lower doses or norepinephrine. Failed SBT for tachypnea. Somnolent  due to needing prn versed  Objective   Blood pressure (!) 108/57, pulse 91, temperature 99.8 Candace [name] (37.7 C), temperature source Axillary, resp. rate (!) 37, height 5\' 8"  (1.727 m), weight 76.5 kg, SpO2 100 %.    Vent Mode:  PRVC FiO2 (%):  [40 %] 40 % Set Rate:  [28 bmp] 28 bmp Vt Set:  [450 mL] 450 mL PEEP:  [5 cmH20] 5 cmH20 Plateau Pressure:  [31 cmH20] 31 cmH20   Intake/Output Summary (Last 24 hours) at 11/04/2020 1353 Last data filed at 11/04/2020 0700 Gross per 24 hour  Intake 2398.06 ml  Output 914 ml  Net 1484.06 ml   Filed Weights   11/02/20 0500 11/03/20 0452 11/04/20 0337  Weight: 75.7 kg 75.6 kg 76.5 kg   Physical Exam  General: Chronically and critically ill debilitated Candace [name], upright  in bed, nods head to questions, flat affect HEENT: trach secure. Anicteric sclera. Neuro: Opens eyes, nods head to yes no questions.  CV: s1s2 no rgm cap refill < 3 seconds  PULM:  Bilateral rhonchi, remains vent dependent with mild tachypnea , overbreathing vent GI: soft round ndnt., stoma is pink with tan stool noted, and dressing is clean dry and intact  Extremities: Decreased muscle tone BUE BLE. BLE boots.  Skin: c/d/w. Sacral wound.   Resolved problems:  Previously hemorrhagic shock requiring PRBCs, Septic shock, MRSA pneumonia s/p 8 days abx Complicated by MRSA healthcare associated pneumonia (treated) Peritonitis and perforated ulcer post repair 09/01/20 MSSA HAP  Assessment:  Candace [name] is a 70 y.o. with history of COVId 19 infection requiring hospitalization in October 2021 with current admission for hemorrhagic shock secondary to perforated gastric ulcer requiring surgical intervention and Candace [name]. Her medical issues are as follows:  Acute metabolic encephalopathy - Prolonged critical illness, sedation/pain - wean precedex as able - still waxing and waning - dilaudid, continue fentanyl patch, seroquel, prn versed  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 status post tracheostomy Stable bilateral pneumonia VDRF -VAP, pulm hygiene - CXR prn  - daily SBT/SAT  Persistent vasoplegia vs circulatory shock -over 60 days -- big picture, long  term pressors is not sustainable  -on max dose midodrine - continue pressors, titrate down as tolerated  Renal failure on CRRT  -over 60 days-- big picture, long term crrt not sustainable -Nephro following - has had multiple attempts to transition from CRRT to HD - more recently CRRT stopped 1/5. Hemodynamics improved. Given hyperkalemia will need gentle HD session attempt for clearance only today. Discussed with family and nephrology.    Afib/DVT HGB drop 1/4 to 6.9>> received 1 unit PRBC No obvious bleeding, suspect nosocomial losses with chronic disease - continue  heparin drip per pharmacy - Monitor for bleeding/ CBC daily. No signs today  Critical Illness Myopathy Severe deconditioning  Inadequate PO intake  - PT following - EN per RDN  DM2 with hyperglycemia -changing insulin reg for better control - Will continue lantus split into BID dosing. - Will continue Q 4 coverage to 14 units  Stage IV sacral ulcer -WOC, hydrotherapy, dakins   History of stage IV colon cancer 1.9 cm cystic appearing lesion located in the pancreatic body  GOC -multiple [place] conversations with various PCCM providers and family have not yielded changes in scope of tx, multiple PCCM providers have been fired by family.  -concerns for futility of certain interventions -ethics consulted 12/30-- recs for time limited trial of CRRT, please see separate Ethics consult  note for full details. Futility policy reviewed with family by ethics. Ongoing meetings with Dr. Hulen Skains with nursing administration and family. The ultimate goal is to transition her safely to HD from CRRT which has proven difficult thus far.   Best practice:  Diet: EN  Pain/Anxiety/Delirium protocol (if indicated): titrating.  VAP protocol (if indicated): yes DVT prophylaxis: IV heparin GI prophylaxis: Protonix Glucose control: SSI, TF coverage, lantus Mobility: PT Family communication: updated 11/05/19 by Dr. Shearon Stalls.  The patient is  critically ill with multiple organ systems failure and requires high complexity decision making for assessment and support, frequent evaluation and titration of therapies, application of advanced monitoring technologies and extensive interpretation of multiple databases.   Critical Care Time devoted to patient care services described in this note is 36 minutes. This time reflects time of care of this Candace [name] . This critical care time does not reflect separately billable procedures or procedure time, teaching time or supervisory time of PA/NP/Med student/Med Resident etc but could involve care discussion time.  Lenice Llamas, MD Pulmonary and [place] Pager: see amion Office:[phone]

## 2020-11-04 NOTE — Progress Notes (Signed)
CRITICAL VALUE ALERT  Critical Value:  K 6.1  Date & Time Notied:  11/04/20 4:58 AM   Provider Notified: Warren Lacy  Orders Received/Actions taken: Lokelma 10g

## 2020-11-04 NOTE — Progress Notes (Signed)
PT Hydrotherapy Cancellation Note  Patient Details Name: Candace [name] MRN: [id number] DOB: 19-Jan-1951   Cancelled Treatment:    Reason Eval/Treat Not Completed: Attempted to schedule hydrotherapy session today. Therapist in communication with RN throughout the day, and planned hydro session conflicted with HD. Will continue to follow and continue with hydrotherapy POC as able.    Thelma Comp 11/04/2020, 2:05 PM   Rolinda Roan, PT, DPT Acute Rehabilitation Services Pager: [phone] Office: [phone]

## 2020-11-05 DIAGNOSIS — I4891 Unspecified atrial fibrillation: Secondary | ICD-10-CM | POA: Diagnosis not present

## 2020-11-05 LAB — BASIC METABOLIC PANEL
Anion gap: 10 (ref 5–15)
Anion gap: 12 (ref 5–15)
BUN: 93 mg/dL — ABNORMAL HIGH (ref 8–23)
BUN: 139 mg/dL — ABNORMAL HIGH (ref 8–23)
CO2: 23 mmol/L (ref 22–32)
CO2: 21 mmol/L — ABNORMAL LOW (ref 22–32)
Calcium: 10.2 mg/dL (ref 8.9–10.3)
Calcium: 10.4 mg/dL — ABNORMAL HIGH (ref 8.9–10.3)
Chloride: 102 mmol/L (ref 98–111)
Chloride: 103 mmol/L (ref 98–111)
Creatinine, Ser: 0.96 mg/dL (ref 0.44–1.00)
Creatinine, Ser: 1.32 mg/dL — ABNORMAL HIGH (ref 0.44–1.00)
GFR, Estimated: >60 mL/min (ref >60)
GFR, Estimated: 44 mL/min — ABNORMAL LOW (ref >60)
Glucose, Bld: 179 mg/dL — ABNORMAL HIGH (ref 70–99)
Glucose, Bld: 119 mg/dL — ABNORMAL HIGH (ref 70–99)
Potassium: 4.8 mmol/L (ref 3.5–5.1)
Potassium: 5.3 mmol/L — ABNORMAL HIGH (ref 3.5–5.1)
Sodium: 135 mmol/L (ref 135–145)
Sodium: 136 mmol/L (ref 135–145)

## 2020-11-05 LAB — GLUCOSE, CAPILLARY
Glucose-Capillary: 160 mg/dL — ABNORMAL HIGH (ref 70–99)
Glucose-Capillary: 127 mg/dL — ABNORMAL HIGH (ref 70–99)
Glucose-Capillary: 91 mg/dL (ref 70–99)
Glucose-Capillary: 109 mg/dL — ABNORMAL HIGH (ref 70–99)
Glucose-Capillary: 159 mg/dL — ABNORMAL HIGH (ref 70–99)

## 2020-11-05 LAB — CBC
HCT: 24.3 % — ABNORMAL LOW (ref 36.0–46.0)
Hemoglobin: 7.7 g/dL — ABNORMAL LOW (ref 12.0–15.0)
MCH: 29.6 pg (ref 26.0–34.0)
MCHC: 31.7 g/dL (ref 30.0–36.0)
MCV: 93.5 fL (ref 80.0–100.0)
Platelets: 175 10*3/uL (ref 150–400)
RBC: 2.6 MIL/uL — ABNORMAL LOW (ref 3.87–5.11)
RDW: 17.6 % — ABNORMAL HIGH (ref 11.5–15.5)
WBC: 14.4 10*3/uL — ABNORMAL HIGH (ref 4.0–10.5)
nRBC: 4.5 % — ABNORMAL HIGH (ref 0.0–0.2)

## 2020-11-05 LAB — HEPARIN LEVEL (UNFRACTIONATED): Heparin Unfractionated: 0.33 IU/mL (ref 0.30–0.70)

## 2020-11-05 LAB — CULTURE, RESPIRATORY W GRAM STAIN

## 2020-11-05 LAB — PARATHYROID HORMONE, INTACT (NO CA): PTH: 200 pg/mL — ABNORMAL HIGH (ref 15–65)

## 2020-11-05 MED ORDER — MIDAZOLAM HCL 2 MG/2ML IJ SOLN: 1.0000 mg | INTRAMUSCULAR | Status: DC | PRN

## 2020-11-05 MED ORDER — INSULIN ASPART 100 UNIT/ML ~~LOC~~ SOLN
7.0000 [IU] | SUBCUTANEOUS | Status: DC
  Administered 2020-11-05 – 2020-11-08 (×16): 7 [IU] via SUBCUTANEOUS
  Administered 2020-11-09: 4 [IU] via SUBCUTANEOUS

## 2020-11-05 MED ORDER — HYDROMORPHONE HCL 1 MG/ML IJ SOLN
1.0000 mg | Freq: Four times a day (QID) | INTRAMUSCULAR | Status: DC
  Administered 2020-11-05 – 2020-11-14 (×32): 1 mg via INTRAVENOUS
  Filled 2020-11-05 (×34): qty 1

## 2020-11-05 MED ORDER — INSULIN GLARGINE 100 UNIT/ML ~~LOC~~ SOLN
15.0000 [IU] | Freq: Two times a day (BID) | SUBCUTANEOUS | Status: DC
  Administered 2020-11-05 – 2020-11-10 (×8): 15 [IU] via SUBCUTANEOUS
  Filled 2020-11-05 (×14): qty 0.15

## 2020-11-05 NOTE — Progress Notes (Signed)
eLink Physician-Brief Progress Note Patient Name: Candace [name] DOB: [birth year] MRN: [id number]   Date of Service  11/05/2020  HPI/Events of Note  Hypoglycemia - Blood glucose = 91 and 109. Nursing not comfortable with giving Novolog 14 units [place] TF coverage.  eICU Interventions  Plan: 1. Hold TF coverage now.  2. Decrease TF coverage to 7 units Q 4 hours. 3. Decrease Lantus dose to 15 units [place] Q 12 hours.      Intervention Category Major Interventions: Other:  Lysle Dingwall 11/05/2020, 9:30 PM

## 2020-11-05 NOTE — Progress Notes (Signed)
NAMEBreelyn [name], MRN:  [id number], DOB:  02-02-1951, LOS: 65 ADMISSION DATE:  08/31/2020, CONSULTATION DATE:  09/07/2020 REFERRING MD:  Dr Candiss Norse, CHIEF COMPLAINT:  Acute resp failure  Brief History   70 year old female who was previously diagnosed with Covid 08/23/2020.  Admitted 11/2 with AF-RVR, found to have a perforated duodenal underwent exploratory laparotomy with Phillip Heal patch placement 11/3.  Past Medical History  Covid pneumonia Atrial fibrillation CKD stage III Diabetes mellitus Hypertension Colon cancer Hyperlipidemia  Significant Hospital Events   11/2 Admitted  11/3 OR with findings of perforated duodenal ulcer 11/10 progressive hemorrhagic shock, intubated, transfused, pressors, proned; started on CRRT in PM 11/03 Exploratory laparotomy, Phillip Heal patch, lysis of adhesion for duodenal ulceration postop day 6 11/16 Extubated. Re-intubated overnight due to respiratory distress and hypoxia with decreased mentation 11/18 Bronch, cultures sent 11/19 Hgb down getting blood 11/24 Spiked fever resume empirical antimicrobial therapy 11/26 Hemorrhagic shock, hgb 5.6, increased pressors, CT A/P  11/30 Per palliative "[name] expresses understanding that patient is unfortunately very fragile despite ongoing intensive medical treatment and full mechanical support. She indicates that the family wants to continue with all current interventions despite potential outcomes". 12/08 CRRT discontinued due to clotting 12/09 Family requested transfer to tertiary care [facility]). [facility] denied transfer  12/10 CRRT restarted. Episodes of tachycardia, tachypnea that seem to improve with pain management 12/11 Back in shock. Pressor requirements up. CXR worse. ABX resumed 12/12 Still requiring inc pressors. Had hypoglycemic event. Basal insulin dosing adjusted 12/13 Pressor requirements better. Now hyperglycemic. Re-adjusted Glycemic control  12/14 Changed dilaudid to1/2 dosing from day further. D/c  vasopressin.  Goals of care reconfirmed with daughter.  Patient continues to desire aggressive care.  Not open to discussing any other option, patient family continues to be hopeful that she will be discharged to home with full recovery in spite of multiple attempts by staff to prepare them that this is unlikely scenario 12/15 Dilaudid discontinued sending cortisol for ongoing pressor dependence 12/16 Serum cortisol <20, added stress dose steroids.  PRN Dilaudid, attempting not go back on Dilaudid infusion 12/17 Developed worsening tachycardia during the evening hours received initially IV albumin, followed by resuming IV Dilaudid with question of suboptimal pain control.  Currently looks better back on Dilaudid drip.  We have been able to wean pressors after adding stress dose steroids; near arrest - bradycardia, better with atropine  12/19 Afebrile . Remains on dilaudid and heparin gtt, dilaudid gtt increased overnight for concern of pain / ongoing tachycardia, no other events . NE and precedex off 12/17. Ongoing CRRT- even UF, dosing lokelmia this morning 12/20 On CRRT.  Renal plans for HD cath and moving to HD. Getting wound care  12/23 -No vent weaning per RT, remains on full support, #8 trach in place but previously had #6, no documentation of change noted in chart Afebrile / WBC 20.4  Vent - 30% FiO2, PEEP 5 Glucose range 176-212 I/O 465 ml stool, 3.6L removed with HD, -1.1L in last 24 hours  RN reports ongoing periods of tachypnea / vent dyssynchrony that responds to dilaudid   12/24 - renal stopping CRRT today and plans HD 10/24/20 . 40% fio2 on vent via Trach. TAchypenic and tachycardic. Afebrile but wbc up to 27.6K. On TF. Onn heparin gtt  12/25 - Back on CRRT. On vent via trach at 40% fio2, On scheduled dilaudid as add on to oxy. Per family request 12/24 - they felt scheduled oxy was not adequate and patient was showing  signs of opioid withdrawal.  Patient also had  worsening SIRS/sepsis  syndrome. Had fever, rising wbc, worsening RR 40-60 and HR 140s sinus-> started On abx yesterday. Fever some better today. WBC plateau at 28,.5K. On new levophed -> signifanct improvement in HR 77 and RR t0 20. On heparin gtt.  On precedex gtt. On levophed gtt 47mcg wthi midodrine. On TF 12/30 Remains on CRRT with intermittent pressor requirements. Ethics consult placed evening of 12/29. Ethics rec time trial of CRRT 12/31 failed SBT with RR 40s. Several conversations between care tam and family, who are upset RE plan of care 1/1 back on pressors  1/4: On pressors, keeping even on CVVHD, HGB drop to 6.9, transfused 1 unit. Improving mental status  Consults:  Cardiology [facility] Nephrology  Ethics  Procedures:  R PICC 11/5 >> A line 11/9 >> out ETT 11/9 > 11/16, 11/16 >> 09/21/2020, 09/21/2020 tracheostomy>> Lt [place] CVL 11/9 >> R IJ trialysis >> out HD catheter 12/1 >>12/20 12/21 - 14.5 Fr, 23 cm right IJ tunneled hemodialysis catheter placement.  Removal of indwelling subclavian catheter.   Significant Diagnostic Tests:  11/3 CT abd/ pelvis > 1. Positive for bowel perforation: Pneumoperitoneum and intermediate density free fluid in the abdomen. Prior total colectomy. The specific site of perforation is unclear-oral contrast present to the proximal jejunum has not obviously leaked. Note that there may be small bowel loops adherent to the ventral abdominal wall along the greater curve of the stomach. 2. Extensive bilateral lower lung pneumonia. No pleural effusion. 3. Other abdominal and pelvic viscera are stable since 2015, including bilateral adrenal adenomas. Chronic renal parapelvic cysts. 4. Aortic Atherosclerosis 11/3 TTE > EF 70-75%, RV not well visualized, mildly reduced RV systolic function 85/27 CT chest/ abd/ pelvis> 1. Interval progression of diffuse bilateral hazy ground-glass airspace opacities with more focal areas of consolidation at the lung bases 2. Trace bilateral pleural  effusions. 3. Postsurgical changes the abdomen as detailed above. No evidence for a postoperative abscess, however evaluation is limited by lack of IV contrast. 4. There is a 1.9 cm cystic appearing lesion located in the pancreatic body. This was not present on the patient's CT from 2015.  Follow-up with an outpatient contrast enhanced MRI is recommended. 5. The endometrial stripe appears diffusely thickened. Follow-up with pelvic ultrasound is recommended. Aortic Atherosclerosis 11/14 LE doppler studies > + DVT of right posterior tibial and peroneal vein, +dVT of left posterior tibial vein  11/26 CT ABD PEL > liver unremarkable, distended gallbladder with layering tiny gallstones versus sludge, no duct dilatation, mild hyperdensity right upper pole renal collecting system new.  No evidence of retroperitoneal bleeding.  Multifocal lower lobe predominant pulmonary infiltrates/pneumonia.  Small left pleural effusion.  Micro Data:  11/10 MRSA PCR > neg 11/10 BC x 2 > neg 09/22/2020 blood cultures x2>> S epi.  09/22/2020 sputum culture>> MRSA Blood 12/1 >> negative. S.epi 1 out of 2, likely contaminant BCx 2 12/12 >> neg xxx Trach aspirate 12/24 - FEW STAPHYLOCOCCUS AUREUS  Blood 12/24 > Negative  Blood 1/2: Sputum 1/2:  Antimicrobials:  azithro 11/2 >11/3 Ceftriaxone 11/2  Fluconazole 11/3 Zosyn 11/3 >> 11/7 Vanc 11/10 off Cefepime 11/10 > 11/16 09/22/2020 vancomycin for MRSA PNA >> 12/2 xxxx Vanc 12/11> 12/17 Zosyn 12/11> 12/17 xxxx vanc 12/24 (staph resp cutlure) >> 12/28 Zosyn 12/24 - 12/27 Cefazolin 12/27 >>1/1 linezolid 1/2 merrem 1/2 eraxis 1/2   Subjective:   Levophed continues to wean.   Increasing agitation towards end of iHD  session per nursing yesterday.   Received total of 7mg  of dilaudid in past 24 hours and 4 mg versed yesterday.  Objective   Blood pressure (!) 98/52, pulse 80, temperature 98.7 F (37.1 C), resp. rate (!) 32, height 5\' 8"  (1.727 m), weight  76.5 kg, SpO2 100 %.    Vent Mode: PRVC FiO2 (%):  [40 %] 40 % Set Rate:  [28 bmp] 28 bmp Vt Set:  [450 mL] 450 mL PEEP:  [5 cmH20] 5 cmH20 Plateau Pressure:  [22 cmH20-31 cmH20] 22 cmH20   Intake/Output Summary (Last 24 hours) at 11/05/2020 0726 Last data filed at 11/05/2020 0600 Gross per 24 hour  Intake 2392.38 ml  Output 575 ml  Net 1817.38 ml   Filed Weights   11/04/20 0337 11/04/20 1355 11/04/20 1645  Weight: 76.5 kg 76.5 kg 76.5 kg   Physical Exam  General: Chronically and critically ill debilitated F, upright  in bed, nods head to questions, flat affect HEENT: trach secure. Anicteric sclera. Neuro: Opens eyes, nods head to yes no questions.  CV: s1s2 no rgm cap refill < 3 seconds  PULM:  Bilateral course breath sounds, remains vent dependent with mild tachypnea , overbreathing vent GI: soft round ndnt., stoma is pink with tan stool noted, and dressing is clean dry and intact  Extremities: Decreased muscle tone BUE BLE. BLE boots.  Skin: c/d/w. Sacral wound.   Resolved problems:  Previously hemorrhagic shock requiring PRBCs, Septic shock, MRSA pneumonia s/p 8 days abx Complicated by MRSA healthcare associated pneumonia (treated) Peritonitis and perforated ulcer post repair 09/01/20 MSSA HAP  Assessment:  Candace [name] is a 70 y.o. with history of COVId 19 infection requiring hospitalization in October 2021 with current admission for hemorrhagic shock secondary to perforated gastric ulcer requiring surgical intervention and [place]. Her medical issues are as follows:  Acute metabolic encephalopathy - Prolonged critical illness, sedation/pain - wean precedex as able - still waxing and waning - dilaudid, continue fentanyl patch, seroquel, prn versed.  -Reduced max versed dose to 1mg  and decreased dilaudid frequency to every 6 hours today.   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  - iHD started 1/6, next session planned for 1/8   Afib/DVT HGB drop 1/4 to 6.9>> received 1 unit PRBC No obvious bleeding, suspect nosocomial losses with chronic disease - continue  heparin drip per pharmacy - Monitor for bleeding/ CBC daily. No signs today  Critical Illness Myopathy Severe deconditioning  Inadequate PO intake  - PT following - EN per RDN  DM2 with hyperglycemia - Will continue lantus split into BID dosing. - Will continue Q 4 coverage to 14 units  Stage IV sacral ulcer -WOC, hydrotherapy, dakins   History of stage IV colon cancer 1.9 cm cystic appearing lesion located in the pancreatic body  GOC -multiple GOC conversations with various PCCM providers and family have not yielded changes in scope of tx, multiple PCCM providers have been fired by family.  -concerns for futility of  certain interventions -ethics consulted 12/30-- recs for time limited trial of CRRT, please see separate Ethics consult note for full details. Futility policy reviewed with family by ethics. Ongoing meetings with Dr. Hulen Skains with nursing administration and family. The ultimate goal is to transition her safely to HD from CRRT which has proven difficult thus far.   Best practice:  Diet: EN  Pain/Anxiety/Delirium protocol (if indicated): titrating.  VAP protocol (if indicated): yes DVT prophylaxis: IV heparin GI prophylaxis: Protonix Glucose control: SSI, TF coverage, lantus Mobility: PT Family communication: updated 11/06/19 by Dr.  Erin Fulling  The patient is critically ill with multiple organ systems failure and requires high complexity decision making for assessment and support, frequent evaluation and titration of therapies, application of advanced monitoring technologies and extensive interpretation of multiple databases.   Critical Care Time devoted to patient care services described in this note is 45 minutes. This time reflects time of care of this [place] . This critical care time does not reflect separately billable procedures or procedure time, teaching time or supervisory time of PA/NP/Med student/Med Resident etc but could involve care discussion time.  Freda Jackson, MD [facility] Office: [phone]   See Amion for Pager Details

## 2020-11-05 NOTE — Progress Notes (Signed)
[facility] Progress Note   Assessment/ Plan:   # AKI- oliguric now likely anuric. ATN for shock.Started on CRRT 09/08/20 due to persistent oliguria/anuria and hyperkalemia.TDC placed on 10/19/20. -Patient has previously failed multiple transitions to HD w/ low Bps requiring pressors -No signs of meaningful recovery from our team -Family expressed ongoing wishes for aggressive care -Ethics seen 12/30 and suggested time trial of 5-7 days CV while finishing treatment for PNA - CRRT stopped 11/03/20 at 1700 - Tolerated dialysis on 1/6 to a certain extent being she still required pressors, received albumin, and no UF was obtained - Plan to trial HD again tomorrow and wean supportive meds as able  # h/o hemorrhagic shock/ ABLA- ongoing blood lossTransfuse as needed, have added ESA.    # Septic shock: still requiring low-dose pressors, over max dose of midodrine, on meropenem empirically.  Mgmt per primary  # Perforated duodenal ulcer- s/p exploratory lap and Graham patch placement per surgery.  # Acute hypoxic/hypercapnic respiratory failure due to ARDS from Recent Covid-19 PNA- currently on vent via trach per PCCM. Weaning efforts have been slow progress.  # Atrial fibrillation- on hep gtt  # Severe protein malnutrition- per primary svc.   #Hypercalcemia: immobility and CKD contributing, PTH 200 and Vit D normal. Calcium okay today. Could consider Sensipar if continued issue.  # Disposition- poor overall prognosis.    Subjective:    Tolerated dialysis with norepi yesterday. No UF. Minimal norepi on right now.    Objective:   BP 102/73   Pulse 61   Temp 98.2 F (36.8 C) (Oral)   Resp (!) 28   Ht 5' 8" (1.727 m)   Wt 76.5 kg   SpO2 100%   BMI 25.64 kg/m   Physical Exam: Gen: lying in bed. Appears comfortable HEENT: trach in place CVS: RRR Resp: breathing is unlabored Abd: soft, nontender Ext: no LE edema, heels in protectors ACCESS: R IJ  TDC  Labs: BMET Recent Labs  Lab 11/01/20 0330 11/01/20 1623 11/02/20 0220 11/02/20 1543 11/03/20 0518 11/04/20 0338 11/04/20 1843 11/05/20 0350  NA 132* 130* 131* 134* 131* 135 134* 135  K 5.0 4.6 4.5 4.3 4.6 6.1* 3.7 4.8  CL 100 99 99 101 100 104 99 102  CO2 22 22 22 23 24 21* 24 23  GLUCOSE 270* 251* 272* 249* 245* 118* 117* 179*  BUN 33* 45* 54* 45* 43* 90* 57* 93*  CREATININE 0.52 0.56 0.58 0.55 0.51 0.93 0.64 0.96  CALCIUM 10.6* 9.8 9.9 10.4* 10.9* 11.5* 9.8 10.2  PHOS 3.6 3.6 3.9 3.4 3.7 5.8* 3.5  --    CBC Recent Labs  Lab 10/31/20 1018 11/01/20 0330 11/02/20 0220 11/02/20 1543 11/03/20 0518 11/04/20 0338 11/05/20 0350  WBC 23.1*   < > 35.9*  --  23.3* 23.1* 14.4*  NEUTROABS 16.5*  --   --   --   --   --   --   HGB 7.4*   < > 6.9* 8.8* 8.9* 8.6* 7.7*  HCT 25.5*   < > 23.3* 28.6* 29.1* 27.1* 24.3*  MCV 96.2   < > 97.9  --  92.7 92.8 93.5  PLT 224   < > 227  --  182 203 175   < > = values in this interval not displayed.      Medications:    . B-complex with vitamin C  1 tablet Per Tube Daily  . chlorhexidine gluconate (MEDLINE KIT)  15 mL Mouth Rinse   BID  . Chlorhexidine Gluconate Cloth  6 each Topical Daily  . Chlorhexidine Gluconate Cloth  6 each Topical Q0600  . cholecalciferol  2,000 Units Per Tube Daily  . clonazepam  1 mg Per Tube TID  . darbepoetin (ARANESP) injection - DIALYSIS  100 mcg Subcutaneous Q Tue-1800  . docusate  100 mg Per Tube BID  . feeding supplement (PROSource TF)  90 mL Per Tube TID  . fentaNYL  1 patch Transdermal Q72H  . guaiFENesin  15 mL Per Tube Q12H  .  HYDROmorphone (DILAUDID) injection  1 mg Intravenous Q6H  . influenza vaccine adjuvanted  0.5 mL Intramuscular Tomorrow-1000  . insulin aspart  0-15 Units Subcutaneous Q4H  . insulin aspart  14 Units Subcutaneous Q4H  . insulin glargine  25 Units Subcutaneous BID  . mouth rinse  15 mL Mouth Rinse 10 times per day  . midodrine  40 mg Per Tube TID WC  . nutrition  supplement (JUVEN)  1 packet Per Tube BID BM  . pantoprazole sodium  40 mg Per Tube BID  . polyethylene glycol  17 g Per Tube Daily  . QUEtiapine  100 mg Per Tube BID  . sodium chloride flush  10-40 mL Intracatheter Q12H     J   11/05/2020, 10:22 AM   

## 2020-11-05 NOTE — Progress Notes (Signed)
ANTICOAGULATION CONSULT NOTE  Pharmacy Consult:  Heparin Indication: atrial fibrillation   Labs: Recent Labs    11/03/20 0518 11/04/20 0338 11/04/20 1843 11/05/20 0350  HGB 8.9* 8.6*  --  7.7*  HCT 29.1* 27.1*  --  24.3*  PLT 182 203  --  175  HEPARINUNFRC 0.59 0.57  --  0.33  CREATININE 0.51 0.93 0.64 0.96    Assessment: 70 yo female with history of Afib on apixaban prior to admission, which has been held and patient transitioned to IV heparin.   Heparin level remains therapeutic at 0.33 on heparin 1250 units/hr. Hgb 7.7. Plt wnl.   Goal of Therapy:  Heparin level 0.3-0.5 units/ml   Plan:  Continue heparin at 1250 units/hr Monitor daily heparin level and CBC  Alanda Slim, PharmD, [facility] Clinical Pharmacist Please see AMION for all Pharmacists' Contact Phone Numbers 11/05/2020, 7:14 AM

## 2020-11-05 NOTE — Progress Notes (Signed)
Physical Therapy Wound Treatment Patient Details  Name: Candace [name] MRN: [id number] Date of Birth: September 29, 1951  Today's Date: 11/05/2020 Time: 0912-0950 Time Calculation (min): 38 min  Subjective  Subjective: Semi-alert on vent Patient and Family Stated Goals: Heal wound; maintain pt comfort per daughter Date of Onset:  (Unknown)  Pain Score:  No signs of pain or discomfort.  Wound Assessment     Pressure Injury 09/25/20 Sacrum Bilateral;Medial Deep Tissue Pressure Injury - Purple or maroon localized area of discolored intact skin or blood-filled blister due to damage of underlying soft tissue from pressure and/or shear. Purple, non-blanchable, b (Active)  Wound Image   11/05/20 1019  Dressing Type ABD;Barrier Film (skin prep);Gauze (Comment);Tape dressing 11/05/20 1019  Dressing Changed;Clean;Dry;Intact 11/05/20 1019  Dressing Change Frequency Daily 11/05/20 1019  State of Healing Eschar 11/05/20 1019  Site / Wound Assessment Yellow;Pink;Black 11/05/20 1019  % Wound base Red or Granulating 20% 11/05/20 1019  % Wound base Yellow/Fibrinous Exudate 70% 11/05/20 1019  % Wound base Black/Eschar 10% 11/05/20 1019  % Wound base Other/Granulation Tissue (Comment) 0% 11/05/20 1019  Peri-wound Assessment Excoriated 11/05/20 1019  Wound Length (cm) 16 cm 11/05/20 1019  Wound Width (cm) 11 cm 11/05/20 1019  Wound Depth (cm) 5 cm 11/05/20 1019  Wound Surface Area (cm^2) 176 cm^2 11/05/20 1019  Wound Volume (cm^3) 880 cm^3 11/05/20 1019  Tunneling (cm) 0 10/13/20 1200  Undermining (cm) 0 10/13/20 1200  Margins Unattached edges (unapproximated) 11/05/20 1019  Drainage Amount Minimal 11/05/20 1019  Drainage Description Serosanguineous 11/05/20 1019  Treatment Debridement (Selective);Hydrotherapy (Pulse lavage);Packing (Saline gauze) 11/05/20 1019      Hydrotherapy Pulsed lavage therapy - wound location: sacrum Pulsed Lavage with Suction (psi): 12 psi (8-12) Pulsed Lavage with Suction -  Normal Saline Used: 1000 mL Pulsed Lavage Tip: Tip with splash shield Selective Debridement Selective Debridement - Location: sacrum Selective Debridement - Tools Used: Forceps;Scissors Selective Debridement - Tissue Removed: necrotic adipose tissue and eschar   Wound Assessment and Plan  Wound Therapy - Assess/Plan/Recommendations Wound Therapy - Clinical Statement: Very slow progress with debridement of wound. Will continue to follow for selective removal of unviable tissue, to decrease bioburden and promote wound bed healing. Wound Therapy - Functional Problem List: Global weakness and immobility Factors Delaying/Impairing Wound Healing: Diabetes Mellitus;Immobility;Multiple medical problems Hydrotherapy Plan: Debridement;Dressing change;Patient/family education;Pulsatile lavage with suction Wound Therapy - Frequency: 6X / week Wound Therapy - Follow Up Recommendations: Skilled nursing facility Wound Plan: See above  Wound Therapy Goals- Improve the function of patient's integumentary system by progressing the wound(s) through the phases of wound healing (inflammation - proliferation - remodeling) by: Decrease Necrotic Tissue to: 60 Decrease Necrotic Tissue - Progress: Revised due to lack of progress Increase Granulation Tissue to: 40 Increase Granulation Tissue - Progress: Revised due to lack of progress Goals/treatment plan/discharge plan were made with and agreed upon by patient/family: No, Patient unable to participate in goals/treatment/discharge plan and family unavailable Time For Goal Achievement: 7 days Wound Therapy - Potential for Goals: Fair  Goals will be updated until maximal potential achieved or discharge criteria met.  Discharge criteria: when goals achieved, discharge from hospital, MD decision/surgical intervention, no progress towards goals, refusal/missing three consecutive treatments without notification or medical reason.  GP     Shary Decamp [place] 11/05/2020,  10:30 AM [place] Pager [phone] Office [phone]

## 2020-11-06 DIAGNOSIS — I4891 Unspecified atrial fibrillation: Secondary | ICD-10-CM | POA: Diagnosis not present

## 2020-11-06 LAB — GLUCOSE, CAPILLARY
Glucose-Capillary: 150 mg/dL — ABNORMAL HIGH (ref 70–99)
Glucose-Capillary: 166 mg/dL — ABNORMAL HIGH (ref 70–99)
Glucose-Capillary: 170 mg/dL — ABNORMAL HIGH (ref 70–99)
Glucose-Capillary: 178 mg/dL — ABNORMAL HIGH (ref 70–99)
Glucose-Capillary: 184 mg/dL — ABNORMAL HIGH (ref 70–99)
Glucose-Capillary: 240 mg/dL — ABNORMAL HIGH (ref 70–99)

## 2020-11-06 LAB — BASIC METABOLIC PANEL
Anion gap: 17 — ABNORMAL HIGH (ref 5–15)
Anion gap: 15 (ref 5–15)
BUN: 166 mg/dL — ABNORMAL HIGH (ref 8–23)
BUN: 189 mg/dL — ABNORMAL HIGH (ref 8–23)
CO2: 17 mmol/L — ABNORMAL LOW (ref 22–32)
CO2: 19 mmol/L — ABNORMAL LOW (ref 22–32)
Calcium: 9.5 mg/dL (ref 8.9–10.3)
Calcium: 9.4 mg/dL (ref 8.9–10.3)
Chloride: 102 mmol/L (ref 98–111)
Chloride: 104 mmol/L (ref 98–111)
Creatinine, Ser: 1.46 mg/dL — ABNORMAL HIGH (ref 0.44–1.00)
Creatinine, Ser: 1.66 mg/dL — ABNORMAL HIGH (ref 0.44–1.00)
GFR, Estimated: 39 mL/min — ABNORMAL LOW (ref >60)
GFR, Estimated: 33 mL/min — ABNORMAL LOW (ref >60)
Glucose, Bld: 238 mg/dL — ABNORMAL HIGH (ref 70–99)
Glucose, Bld: 205 mg/dL — ABNORMAL HIGH (ref 70–99)
Potassium: 6.1 mmol/L — ABNORMAL HIGH (ref 3.5–5.1)
Potassium: 6.5 mmol/L (ref 3.5–5.1)
Sodium: 136 mmol/L (ref 135–145)
Sodium: 138 mmol/L (ref 135–145)

## 2020-11-06 LAB — CULTURE, BLOOD (ROUTINE X 2)
Culture: NO GROWTH
Culture: NO GROWTH
Special Requests: ADEQUATE

## 2020-11-06 LAB — CBC
HCT: 27 % — ABNORMAL LOW (ref 36.0–46.0)
Hemoglobin: 8.2 g/dL — ABNORMAL LOW (ref 12.0–15.0)
MCH: 28.8 pg (ref 26.0–34.0)
MCHC: 30.4 g/dL (ref 30.0–36.0)
MCV: 94.7 fL (ref 80.0–100.0)
Platelets: 196 10*3/uL (ref 150–400)
RBC: 2.85 MIL/uL — ABNORMAL LOW (ref 3.87–5.11)
RDW: 17.5 % — ABNORMAL HIGH (ref 11.5–15.5)
WBC: 11 10*3/uL — ABNORMAL HIGH (ref 4.0–10.5)
nRBC: 2.9 % — ABNORMAL HIGH (ref 0.0–0.2)

## 2020-11-06 LAB — HEPARIN LEVEL (UNFRACTIONATED): Heparin Unfractionated: 0.32 IU/mL (ref 0.30–0.70)

## 2020-11-06 MED ORDER — QUETIAPINE FUMARATE 100 MG PO TABS
100.0000 mg | ORAL_TABLET | Freq: Every day | ORAL | Status: DC
  Administered 2020-11-07 – 2020-11-17 (×11): 100 mg
  Filled 2020-11-06 (×11): qty 1

## 2020-11-06 MED ORDER — SODIUM ZIRCONIUM CYCLOSILICATE 5 G PO PACK: 10.0000 g | PACK | Freq: Once | ORAL | Status: DC

## 2020-11-06 MED ORDER — SODIUM ZIRCONIUM CYCLOSILICATE 5 G PO PACK
10.0000 g | PACK | Freq: Once | ORAL | Status: AC
  Administered 2020-11-07: 10 g
  Filled 2020-11-06: qty 2

## 2020-11-06 MED ORDER — QUETIAPINE FUMARATE 50 MG PO TABS
50.0000 mg | ORAL_TABLET | Freq: Every day | ORAL | Status: DC
  Administered 2020-11-07 – 2020-11-18 (×12): 50 mg
  Filled 2020-11-06 (×14): qty 1

## 2020-11-06 MED ORDER — QUETIAPINE FUMARATE 50 MG PO TABS: 50.0000 mg | ORAL_TABLET | Freq: Every day | ORAL | Status: DC

## 2020-11-06 MED ORDER — CLONAZEPAM 0.5 MG PO TBDP
0.5000 mg | ORAL_TABLET | Freq: Three times a day (TID) | ORAL | Status: DC
  Administered 2020-11-06 – 2020-11-18 (×36): 0.5 mg
  Filled 2020-11-06 (×37): qty 1

## 2020-11-06 MED ORDER — SODIUM ZIRCONIUM CYCLOSILICATE 5 G PO PACK
20.0000 g | PACK | Freq: Once | ORAL | Status: AC
  Administered 2020-11-06: 20 g

## 2020-11-06 MED ORDER — SODIUM ZIRCONIUM CYCLOSILICATE 5 G PO PACK
20.0000 g | PACK | Freq: Once | ORAL | Status: DC
  Filled 2020-11-06: qty 4

## 2020-11-06 MED ORDER — SODIUM ZIRCONIUM CYCLOSILICATE 5 G PO PACK
10.0000 g | PACK | Freq: Once | ORAL | Status: AC
  Administered 2020-11-06: 10 g
  Filled 2020-11-06: qty 2

## 2020-11-06 MED ORDER — SODIUM ZIRCONIUM CYCLOSILICATE 5 G PO PACK
10.0000 g | PACK | Freq: Once | ORAL | Status: AC
  Administered 2020-11-06: 10 g via ORAL
  Filled 2020-11-06: qty 2

## 2020-11-06 NOTE — Progress Notes (Signed)
ANTICOAGULATION CONSULT NOTE  Pharmacy Consult:  Heparin Indication: atrial fibrillation   Labs: Recent Labs    11/04/20 0338 11/04/20 1843 11/05/20 0350 11/05/20 1700 11/06/20 0556  HGB 8.6*  --  7.7*  --  8.2*  HCT 27.1*  --  24.3*  --  27.0*  PLT 203  --  175  --  196  HEPARINUNFRC 0.57  --  0.33  --  0.32  CREATININE 0.93   < > 0.96 1.32* 1.46*   < > = values in this interval not displayed.    Assessment: 70 yo female with history of Afib on apixaban prior to admission, which has been held and patient transitioned to IV heparin.   Heparin level remains therapeutic at 0.32 on heparin 1250 units/hr. Hgb 8.2. Plt wnl.   Goal of Therapy:  Heparin level 0.3-0.5 units/ml   Plan:  Continue heparin at 1250 units/hr Monitor daily heparin level and CBC  Alanda Slim, PharmD, [facility] Clinical Pharmacist Please see AMION for all Pharmacists' Contact Phone Numbers 11/06/2020, 7:56 AM

## 2020-11-06 NOTE — Progress Notes (Signed)
NAMEDeyjah Kindel, MRN:  [id number], DOB:  1951-10-19, LOS: 14 ADMISSION DATE:  08/31/2020, CONSULTATION DATE:  09/07/2020 REFERRING MD:  Dr Candiss Norse, CHIEF COMPLAINT:  Acute resp failure  Brief History   70 year old female who was previously diagnosed with Covid 08/23/2020.  Admitted 11/2 with AF-RVR, found to have a perforated duodenal underwent exploratory laparotomy with Phillip Heal patch placement 11/3.  Past Medical History  Covid pneumonia Atrial fibrillation CKD stage III Diabetes mellitus Hypertension Colon cancer Hyperlipidemia  Significant Hospital Events   11/2 Admitted  11/3 OR with findings of perforated duodenal ulcer 11/10 progressive hemorrhagic shock, intubated, transfused, pressors, proned; started on CRRT in PM 11/03 Exploratory laparotomy, Phillip Heal patch, lysis of adhesion for duodenal ulceration postop day 6 11/16 Extubated. Re-intubated overnight due to respiratory distress and hypoxia with decreased mentation 11/18 Bronch, cultures sent 11/19 Hgb down getting blood 11/24 Spiked fever resume empirical antimicrobial therapy 11/26 Hemorrhagic shock, hgb 5.6, increased pressors, CT A/P  11/30 Per palliative "[name] expresses understanding that patient is unfortunately very fragile despite ongoing intensive medical treatment and full mechanical support. She indicates that the family wants to continue with all current interventions despite potential outcomes". 12/08 CRRT discontinued due to clotting 12/09 Family requested transfer to tertiary care [facility] denied transfer  12/10 CRRT restarted. Episodes of tachycardia, tachypnea that seem to improve with pain management 12/11 Back in shock. Pressor requirements up. CXR worse. ABX resumed 12/12 Still requiring inc pressors. Had hypoglycemic event. Basal insulin dosing adjusted 12/13 Pressor requirements better. Now hyperglycemic. Re-adjusted Glycemic control  12/14 Changed dilaudid to1/2 dosing from day further. D/c  vasopressin.  Goals of care reconfirmed with daughter.  Patient continues to desire aggressive care.  Not open to discussing any other option, patient family continues to be hopeful that she will be discharged to home with full recovery in spite of multiple attempts by staff to prepare them that this is unlikely scenario 12/15 Dilaudid discontinued sending cortisol for ongoing pressor dependence 12/16 Serum cortisol <20, added stress dose steroids.  PRN Dilaudid, attempting not go back on Dilaudid infusion 12/17 Developed worsening tachycardia during the evening hours received initially IV albumin, followed by resuming IV Dilaudid with question of suboptimal pain control.  Currently looks better back on Dilaudid drip.  We have been able to wean pressors after adding stress dose steroids; near arrest - bradycardia, better with atropine  12/19 Afebrile . Remains on dilaudid and heparin gtt, dilaudid gtt increased overnight for concern of pain / ongoing tachycardia, no other events . NE and precedex off 12/17. Ongoing CRRT- even UF, dosing lokelmia this morning 12/20 On CRRT.  Renal plans for HD cath and moving to HD. Getting wound care  12/23 -No vent weaning per RT, remains on full support, #8 trach in place but previously had #6, no documentation of change noted in chart Afebrile / WBC 20.4  Vent - 30% FiO2, PEEP 5 Glucose range 176-212 I/O 465 ml stool, 3.6L removed with HD, -1.1L in last 24 hours  RN reports ongoing periods of tachypnea / vent dyssynchrony that responds to dilaudid   12/24 - renal stopping CRRT today and plans HD 10/24/20 . 40% fio2 on vent via Trach. TAchypenic and tachycardic. Afebrile but wbc up to 27.6K. On TF. Onn heparin gtt  12/25 - Back on CRRT. On vent via trach at 40% fio2, On scheduled dilaudid as add on to oxy. Per family request 12/24 - they felt scheduled oxy was not adequate and patient was showing  signs of opioid withdrawal.  Patient also had  worsening SIRS/sepsis  syndrome. Had fever, rising wbc, worsening RR 40-60 and HR 140s sinus-> started On abx yesterday. Fever some better today. WBC plateau at 28,.5K. On new levophed -> signifanct improvement in HR 77 and RR t0 20. On heparin gtt.  On precedex gtt. On levophed gtt 67mcg wthi midodrine. On TF 12/30 Remains on CRRT with intermittent pressor requirements. Ethics consult placed evening of 12/29. Ethics rec time trial of CRRT 12/31 failed SBT with RR 40s. Several conversations between care tam and family, who are upset RE plan of care 1/1 back on pressors  1/4: On pressors, keeping even on CVVHD, HGB drop to 6.9, transfused 1 unit. Improving mental status  Consults:  Cardiology [facility] Nephrology  Ethics  Procedures:  R PICC 11/5 >> A line 11/9 >> out ETT 11/9 > 11/16, 11/16 >> 09/21/2020, 09/21/2020 tracheostomy>> Lt [place] CVL 11/9 >> R IJ trialysis >> out HD catheter 12/1 >>12/20 12/21 - 14.5 Fr, 23 cm right IJ tunneled hemodialysis catheter placement.  Removal of indwelling subclavian catheter.   Significant Diagnostic Tests:  11/3 CT abd/ pelvis > 1. Positive for bowel perforation: Pneumoperitoneum and intermediate density free fluid in the abdomen. Prior total colectomy. The specific site of perforation is unclear-oral contrast present to the proximal jejunum has not obviously leaked. Note that there may be small bowel loops adherent to the ventral abdominal wall along the greater curve of the stomach. 2. Extensive bilateral lower lung pneumonia. No pleural effusion. 3. Other abdominal and pelvic viscera are stable since 2015, including bilateral adrenal adenomas. Chronic renal parapelvic cysts. 4. Aortic Atherosclerosis 11/3 TTE > EF 70-75%, RV not well visualized, mildly reduced RV systolic function 02/58 CT chest/ abd/ pelvis> 1. Interval progression of diffuse bilateral hazy ground-glass airspace opacities with more focal areas of consolidation at the lung bases 2. Trace bilateral pleural  effusions. 3. Postsurgical changes the abdomen as detailed above. No evidence for a postoperative abscess, however evaluation is limited by lack of IV contrast. 4. There is a 1.9 cm cystic appearing lesion located in the pancreatic body. This was not present on the patient's CT from 2015.  Follow-up with an outpatient contrast enhanced MRI is recommended. 5. The endometrial stripe appears diffusely thickened. Follow-up with pelvic ultrasound is recommended. Aortic Atherosclerosis 11/14 LE doppler studies > + DVT of right posterior tibial and peroneal vein, +dVT of left posterior tibial vein  11/26 CT ABD PEL > liver unremarkable, distended gallbladder with layering tiny gallstones versus sludge, no duct dilatation, mild hyperdensity right upper pole renal collecting system new.  No evidence of retroperitoneal bleeding.  Multifocal lower lobe predominant pulmonary infiltrates/pneumonia.  Small left pleural effusion.  Micro Data:  11/10 MRSA PCR > neg 11/10 BC x 2 > neg 09/22/2020 blood cultures x2>> S epi.  09/22/2020 sputum culture>> MRSA Blood 12/1 >> negative. S.epi 1 out of 2, likely contaminant BCx 2 12/12 >> neg xxx Trach aspirate 12/24 - FEW STAPHYLOCOCCUS AUREUS  Blood 12/24 > Negative  Blood 1/2: Sputum 1/2:  Antimicrobials:  azithro 11/2 >11/3 Ceftriaxone 11/2  Fluconazole 11/3 Zosyn 11/3 >> 11/7 Vanc 11/10 off Cefepime 11/10 > 11/16 09/22/2020 vancomycin for MRSA PNA >> 12/2 xxxx Vanc 12/11> 12/17 Zosyn 12/11> 12/17 xxxx vanc 12/24 (staph resp cutlure) >> 12/28 Zosyn 12/24 - 12/27 Cefazolin 12/27 >>1/1 linezolid 1/2 merrem 1/2 eraxis 1/2   Subjective:  No acute events overnight.   Levophed continues to wean slowly.  Plan  for iHD today.  Update daughter at the bedside  Objective   Blood pressure (!) 113/56, pulse 84, temperature 98.8 F (37.1 C), temperature source Axillary, resp. rate (!) 33, height 5\' 8"  (1.727 m), weight 76.5 kg, SpO2 100 %.    Vent  Mode: PRVC FiO2 (%):  [40 %] 40 % Set Rate:  [28 bmp] 28 bmp Vt Set:  [450 mL] 450 mL PEEP:  [5 cmH20] 5 cmH20 Plateau Pressure:  [20 cmH20-23 cmH20] 23 cmH20   Intake/Output Summary (Last 24 hours) at 11/06/2020 1019 Last data filed at 11/06/2020 0600 Gross per 24 hour  Intake 2282.4 ml  Output 350 ml  Net 1932.4 ml   Filed Weights   11/04/20 0337 11/04/20 1355 11/04/20 1645  Weight: 76.5 kg 76.5 kg 76.5 kg   Physical Exam  General: Chronically and critically ill debilitated F, upright in bed HEENT: trach secure. Anicteric sclera. Moist mucous membranes. Neuro: Opens eyes, nods head to yes no questions.  CV: s1s2 no rgm cap refill < 3 seconds  PULM:  No wheezing or rhonchi. Mildly course breath sounds. GI: soft round ndnt., stoma is pink, tan stool noted, and dressing is clean dry and intact  Extremities: Decreased muscle tone BUE BLE. BLE boots.  Skin: c/d/w. Sacral wound.   Resolved problems:  Previously hemorrhagic shock requiring PRBCs, Septic shock, MRSA pneumonia s/p 8 days abx Complicated by MRSA healthcare associated pneumonia (treated) Peritonitis and perforated ulcer post repair 09/01/20 MSSA HAP  Assessment:  Tyrina Hines is a 70 y.o. with history of COVId 19 infection requiring hospitalization in October 2021 with current admission for hemorrhagic shock secondary to perforated gastric ulcer requiring surgical intervention and [place]. Her medical issues are as follows:  Acute metabolic encephalopathy  Prolonged critical illness, sedation/pain - wean precedex as able - still waxing and waning - Reduced clonazepam from 1mg  TID to 0.5mg  TID today - Continue dilaudid 1mg  q6hrs, fentanyl patch 35mcg - Has been on seroquel 100mg  BID. Will reduced AM dose to 50mg  today.  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  - iHD started 1/6, next session planned for today   Afib/DVT HGB drop 1/4 to 6.9>> received 1 unit PRBC No obvious bleeding, suspect nosocomial losses with chronic disease - continue  heparin drip per pharmacy - Monitor for bleeding/ CBC daily. No signs today  Critical Illness Myopathy Severe deconditioning  Inadequate PO intake  - PT following - EN per RDN  DM2 with hyperglycemia - Will continue lantus split into BID dosing. - Will continue Q 4 coverage to 14 units  Stage IV sacral ulcer -WOC, hydrotherapy, dakins   History of stage IV colon cancer 1.9 cm cystic appearing lesion located in the pancreatic body  GOC -multiple [place] conversations with various PCCM providers and family have not yielded changes in scope of tx, multiple PCCM providers have been fired by family.  -concerns for futility of certain interventions -ethics consulted 12/30-- recs for time limited  trial of CRRT, please see separate Ethics consult note for full details. Futility policy reviewed with family by ethics. Ongoing meetings with Dr. Hulen Skains with nursing administration and family. The ultimate goal is to transition her safely to HD from CRRT which has proven difficult thus far.   Best practice:  Diet: EN  Pain/Anxiety/Delirium protocol (if indicated): titrating.  VAP protocol (if indicated): yes DVT prophylaxis: IV heparin GI prophylaxis: Protonix Glucose control: SSI, TF coverage, lantus Mobility: PT Family communication: updated 11/07/19 by Dr. Erin Fulling, daughter at bedside  The patient is critically ill with multiple  organ systems failure and requires high complexity decision making for assessment and support, frequent evaluation and titration of therapies, application of advanced monitoring technologies and extensive interpretation of multiple databases.   Critical Care Time devoted to patient care services described in this note is 40 minutes. This time reflects time of care of this [place] . This critical care time does not reflect separately billable procedures or procedure time, teaching time or supervisory time of PA/NP/Med student/Med Resident etc but could involve care discussion time.  Freda Jackson, MD [facility] Office: [phone]   See Amion for Pager Details

## 2020-11-06 NOTE — Progress Notes (Signed)
Physical Therapy Wound Treatment Patient Details  Name: Candace [name] MRN: [id number] Date of Birth: [birth year]  Today's Date: 11/06/2020 Time: 2409-7353 Time Calculation (min): 55 min  Subjective  Subjective: Semi-alert on vent Patient and Family Stated Goals: Heal wound; maintain pt comfort per daughter Date of Onset:  (Unknown)  Pain Score:  Pt appeared to tolerate treatment well without obvious signs of discomfort or distress. Premedicated and RN present for intermittent suctioning throughout treatment.   Wound Assessment  Pressure Injury 09/25/20 Sacrum Bilateral;Medial Deep Tissue Pressure Injury - Purple or maroon localized area of discolored intact skin or blood-filled blister due to damage of underlying soft tissue from pressure and/or shear. Purple, non-blanchable, b (Active)  Dressing Type ABD;Barrier Film (skin prep);Gauze (Comment);Moist to dry;Non adherent 11/06/20 1242  Dressing Changed;Clean;Dry;Intact 11/06/20 1242  Dressing Change Frequency Daily 11/06/20 1242  State of Healing Eschar 11/06/20 1242  Site / Wound Assessment Yellow;Pink;Black 11/06/20 1242  % Wound base Red or Granulating 20% 11/06/20 1242  % Wound base Yellow/Fibrinous Exudate 70% 11/06/20 1242  % Wound base Black/Eschar 10% 11/06/20 1242  % Wound base Other/Granulation Tissue (Comment) 0% 11/06/20 1242  Peri-wound Assessment Excoriated 11/06/20 1242  Wound Length (cm) 16 cm 11/05/20 1019  Wound Width (cm) 11 cm 11/05/20 1019  Wound Depth (cm) 5 cm 11/05/20 1019  Wound Surface Area (cm^2) 176 cm^2 11/05/20 1019  Wound Volume (cm^3) 880 cm^3 11/05/20 1019  Tunneling (cm) 0 10/13/20 1200  Undermining (cm) 0 10/13/20 1200  Margins Unattached edges (unapproximated) 11/06/20 1242  Drainage Amount Minimal 11/06/20 1242  Drainage Description Serosanguineous 11/06/20 1242  Treatment Debridement (Selective);Hydrotherapy (Pulse lavage);Packing (Saline gauze) 11/06/20 1242      Hydrotherapy Pulsed lavage  therapy - wound location: sacrum Pulsed Lavage with Suction (psi): 12 psi (8-12) Pulsed Lavage with Suction - Normal Saline Used: 1000 mL Pulsed Lavage Tip: Tip with splash shield Selective Debridement Selective Debridement - Location: sacrum Selective Debridement - Tools Used: Forceps;Scissors Selective Debridement - Tissue Removed: necrotic adipose tissue and eschar   Wound Assessment and Plan  Wound Therapy - Assess/Plan/Recommendations Wound Therapy - Clinical Statement: Very slow progress with debridement of wound. We are nearing bone at base of wound. Daughter present and questions answered within scope of practice. Will continue to follow for selective removal of unviable tissue, to decrease bioburden and promote wound bed healing. Wound Therapy - Functional Problem List: Global weakness and immobility Factors Delaying/Impairing Wound Healing: Diabetes Mellitus;Immobility;Multiple medical problems Hydrotherapy Plan: Debridement;Dressing change;Patient/family education;Pulsatile lavage with suction Wound Therapy - Frequency: 6X / week Wound Therapy - Follow Up Recommendations: Skilled nursing facility Wound Plan: See above  Wound Therapy Goals- Improve the function of patient's integumentary system by progressing the wound(s) through the phases of wound healing (inflammation - proliferation - remodeling) by: Decrease Necrotic Tissue to: 60 Decrease Necrotic Tissue - Progress: Progressing toward goal Increase Granulation Tissue to: 40 Increase Granulation Tissue - Progress: Progressing toward goal Goals/treatment plan/discharge plan were made with and agreed upon by patient/family: No, Patient unable to participate in goals/treatment/discharge plan and family unavailable Time For Goal Achievement: 7 days Wound Therapy - Potential for Goals: Fair  Goals will be updated until maximal potential achieved or discharge criteria met.  Discharge criteria: when goals achieved, discharge from  hospital, MD decision/surgical intervention, no progress towards goals, refusal/missing three consecutive treatments without notification or medical reason.  GP     Thelma Comp 11/06/2020, 12:54 PM   Rolinda Roan, PT, DPT Acute Rehabilitation Services Pager: [phone]  Office: [phone]    

## 2020-11-06 NOTE — Progress Notes (Signed)
[facility] Progress Note   Assessment/ Plan:   # AKI- oliguric now likely anuric. ATN for shock.Started on CRRT 09/08/20 due to persistent oliguria/anuria and hyperkalemia.[place] placed on 10/19/20. -Patient has previously failed multiple transitions to HD w/ low Bps requiring pressors -No signs of meaningful recovery from our team -Family expressed ongoing wishes for aggressive care -Ethics seen 12/30 and suggested time trial of 5-7 days CV while finishing treatment for PNA - CRRT stopped 11/03/20 at 1700 - Tolerated dialysis on 1/6 to a certain extent being she still required pressors, received albumin, and no UF was obtained - Plan to trial HD again today and wean supportive meds as able  #Hyperkalemia: 2/2 renal failure. Lokelma 10g today. Likely need lokelma daily in between sessions of dialysis  # h/o hemorrhagic shock/ ABLA- ongoing blood lossTransfuse as needed, have added ESA.    # Septic shock: still requiring low-dose pressors, over max dose of midodrine, on meropenem empirically.  Mgmt per primary  # Perforated duodenal ulcer- s/p exploratory lap and [place] patch placement per surgery.  # Acute hypoxic/hypercapnic respiratory failure due to ARDS from Recent Covid-19 PNA- currently on vent via trach per PCCM. Weaning efforts have been slow progress.  # Atrial fibrillation- on hep gtt  # Severe protein malnutrition- per primary svc.   #Hypercalcemia: immobility and CKD contributing, PTH 200 and Vit D normal. Calcium okay today. Could consider Sensipar if continued issue.  # Disposition- poor overall prognosis.    Subjective:    Largely unchanged. K 6.1 today. Remains on low dose of norepi. Plan discussed with daughter.    Objective:   BP (!) 105/57 (BP Location: Left Arm)   Pulse 88   Temp 98.8 F (37.1 C) (Axillary)   Resp (!) 30   Ht 5' 8"  (1.727 m)   Wt 76.5 kg   SpO2 100%   BMI 25.64 kg/m   Physical Exam: Gen: lying in bed. Appears  comfortable HEENT: trach in place CVS: RRR Resp: breathing is unlabored Abd: soft, nontender Ext: no LE edema, heels in protectors ACCESS: R IJ TDC  Labs: BMET Recent Labs  Lab 11/01/20 0330 11/01/20 1623 11/02/20 0220 11/02/20 1543 11/03/20 0518 11/04/20 0338 11/04/20 1843 11/05/20 0350 11/05/20 1700 11/06/20 0556  NA 132* 130* 131* 134* 131* 135 134* 135 136 136  K 5.0 4.6 4.5 4.3 4.6 6.1* 3.7 4.8 5.3* 6.1*  CL 100 99 99 101 100 104 99 102 103 102   CO2 22 22 22 23 24  21* 24 23 21* 17*  GLUCOSE 270* 251* 272* 249* 245* 118* 117* 179* 119* 238*  BUN 33* 45* 54* 45* 43* 90* 57* 93* 139* 166*  CREATININE 0.52 0.56 0.58 0.55 0.51 0.93 0.64 0.96 1.32* 1.46*  CALCIUM 10.6* 9.8 9.9 10.4* 10.9* 11.5* 9.8 10.2 10.4* 9.5  PHOS 3.6 3.6 3.9 3.4 3.7 5.8* 3.5  --   --   --    CBC Recent Labs  Lab 10/31/20 1018 11/01/20 0330 11/03/20 0518 11/04/20 0338 11/05/20 0350 11/06/20 0556  WBC 23.1*   < > 23.3* 23.1* 14.4* 11.0*  NEUTROABS 16.5*  --   --   --   --   --   HGB 7.4*   < > 8.9* 8.6* 7.7* 8.2*  HCT 25.5*   < > 29.1* 27.1* 24.3* 27.0*  MCV 96.2   < > 92.7 92.8 93.5 94.7  PLT 224   < > 182 203 175 196   < > = values in  this interval not displayed.      Medications:    . B-complex with vitamin C  1 tablet Per Tube Daily  . chlorhexidine gluconate (MEDLINE KIT)  15 mL Mouth Rinse BID  . Chlorhexidine Gluconate Cloth  6 each Topical Daily  . Chlorhexidine Gluconate Cloth  6 each Topical Q0600  . cholecalciferol  2,000 Units Per Tube Daily  . clonazepam  1 mg Per Tube TID  . darbepoetin (ARANESP) injection - DIALYSIS  100 mcg Subcutaneous Q Tue-1800  . docusate  100 mg Per Tube BID  . feeding supplement (PROSource TF)  90 mL Per Tube TID  . fentaNYL  1 patch Transdermal Q72H  . guaiFENesin  15 mL Per Tube Q12H  .  HYDROmorphone (DILAUDID) injection  1 mg Intravenous Q6H  . influenza vaccine adjuvanted  0.5 mL Intramuscular Tomorrow-1000  . insulin aspart  0-15 Units  Subcutaneous Q4H  . insulin aspart  7 Units Subcutaneous Q4H  . insulin glargine  15 Units Subcutaneous BID  . mouth rinse  15 mL Mouth Rinse 10 times per day  . midodrine  40 mg Per Tube TID WC  . nutrition supplement (JUVEN)  1 packet Per Tube BID BM  . pantoprazole sodium  40 mg Per Tube BID  . polyethylene glycol  17 g Per Tube Daily  . QUEtiapine  100 mg Per Tube BID  . sodium chloride flush  10-40 mL Intracatheter Q12H  . sodium zirconium cyclosilicate  10 g Oral Once    Reesa Chew  11/06/2020, 8:38 AM

## 2020-11-07 DIAGNOSIS — I4891 Unspecified atrial fibrillation: Secondary | ICD-10-CM | POA: Diagnosis not present

## 2020-11-07 LAB — GLUCOSE, CAPILLARY
Glucose-Capillary: 116 mg/dL — ABNORMAL HIGH (ref 70–99)
Glucose-Capillary: 145 mg/dL — ABNORMAL HIGH (ref 70–99)
Glucose-Capillary: 154 mg/dL — ABNORMAL HIGH (ref 70–99)
Glucose-Capillary: 172 mg/dL — ABNORMAL HIGH (ref 70–99)
Glucose-Capillary: 185 mg/dL — ABNORMAL HIGH (ref 70–99)

## 2020-11-07 LAB — CBC
HCT: 23.2 % — ABNORMAL LOW (ref 36.0–46.0)
Hemoglobin: 7.3 g/dL — ABNORMAL LOW (ref 12.0–15.0)
MCH: 29.6 pg (ref 26.0–34.0)
MCHC: 31.5 g/dL (ref 30.0–36.0)
MCV: 93.9 fL (ref 80.0–100.0)
Platelets: 228 10*3/uL (ref 150–400)
RBC: 2.47 MIL/uL — ABNORMAL LOW (ref 3.87–5.11)
RDW: 17.9 % — ABNORMAL HIGH (ref 11.5–15.5)
WBC: 12.3 10*3/uL — ABNORMAL HIGH (ref 4.0–10.5)
nRBC: 1.9 % — ABNORMAL HIGH (ref 0.0–0.2)

## 2020-11-07 LAB — BASIC METABOLIC PANEL
Anion gap: 17 — ABNORMAL HIGH (ref 5–15)
Anion gap: 11 (ref 5–15)
BUN: 213 mg/dL — ABNORMAL HIGH (ref 8–23)
BUN: 101 mg/dL — ABNORMAL HIGH (ref 8–23)
CO2: 17 mmol/L — ABNORMAL LOW (ref 22–32)
CO2: 24 mmol/L (ref 22–32)
Calcium: 9.2 mg/dL (ref 8.9–10.3)
Calcium: 8.1 mg/dL — ABNORMAL LOW (ref 8.9–10.3)
Chloride: 103 mmol/L (ref 98–111)
Chloride: 100 mmol/L (ref 98–111)
Creatinine, Ser: 1.78 mg/dL — ABNORMAL HIGH (ref 0.44–1.00)
Creatinine, Ser: 0.99 mg/dL (ref 0.44–1.00)
GFR, Estimated: 31 mL/min — ABNORMAL LOW (ref >60)
GFR, Estimated: >60 mL/min (ref >60)
Glucose, Bld: 198 mg/dL — ABNORMAL HIGH (ref 70–99)
Glucose, Bld: 151 mg/dL — ABNORMAL HIGH (ref 70–99)
Potassium: 5.6 mmol/L — ABNORMAL HIGH (ref 3.5–5.1)
Potassium: 3.6 mmol/L (ref 3.5–5.1)
Sodium: 137 mmol/L (ref 135–145)
Sodium: 135 mmol/L (ref 135–145)

## 2020-11-07 LAB — HEPARIN LEVEL (UNFRACTIONATED): Heparin Unfractionated: 0.48 IU/mL (ref 0.30–0.70)

## 2020-11-07 MED ORDER — ALBUMIN HUMAN 25 % IV SOLN
INTRAVENOUS | Status: AC
  Filled 2020-11-07: qty 50

## 2020-11-07 MED ORDER — ALBUMIN HUMAN 25 % IV SOLN
INTRAVENOUS | Status: AC
  Administered 2020-11-07: 12.5 g via INTRAVENOUS
  Filled 2020-11-07: qty 50

## 2020-11-07 MED ORDER — ALBUMIN HUMAN 25 % IV SOLN: 12.5000 g | Freq: Once | INTRAVENOUS | Status: AC | PRN

## 2020-11-07 MED ORDER — ALBUMIN HUMAN 25 % IV SOLN: 12.5000 g | Freq: Once | INTRAVENOUS | Status: AC

## 2020-11-07 MED ORDER — HEPARIN SODIUM (PORCINE) 1000 UNIT/ML IJ SOLN
INTRAMUSCULAR | Status: AC
  Administered 2020-11-07: 1000 [IU] via INTRAVENOUS_CENTRAL
  Filled 2020-11-07: qty 4

## 2020-11-07 NOTE — Progress Notes (Signed)
ANTICOAGULATION CONSULT NOTE  Pharmacy Consult:  Heparin Indication: atrial fibrillation   Labs: Recent Labs    11/05/20 0350 11/05/20 1700 11/06/20 0556 11/06/20 1716 11/07/20 0442 11/07/20 0443  HGB 7.7*  --  8.2*  --   --  7.3*  HCT 24.3*  --  27.0*  --   --  23.2*  PLT 175  --  196  --   --  228  HEPARINUNFRC 0.33  --  0.32  --  0.48  --   CREATININE 0.96   < > 1.46* 1.66*  --  1.78*   < > = values in this interval not displayed.    Assessment: 70 yo female with history of Afib on apixaban prior to admission, which has been held and patient transitioned to IV heparin.   Heparin level remains therapeutic at 0.48 on heparin 1250 units/hr. Hgb 7.3. Plt wnl.   Goal of Therapy:  Heparin level 0.3-0.5 units/ml   Plan:  Continue heparin at 1250 units/hr Monitor daily heparin level and CBC  Alanda Slim, PharmD, [facility] Clinical Pharmacist Please see AMION for all Pharmacists' Contact Phone Numbers 11/07/2020, 9:48 AM

## 2020-11-07 NOTE — Progress Notes (Signed)
Patient declined bath. As I begin to remove the leads, the patient shook her head. I asked her if it was okay to bathe her and she shook her head from side to side, indicating no. I asked her if she would like to bathe at another time and she nodded yes.

## 2020-11-07 NOTE — Progress Notes (Signed)
[facility] Progress Note   Assessment/ Plan:   # AKI- oliguric now likely anuric. ATN for shock.Started on CRRT 09/08/20 due to persistent oliguria/anuria and hyperkalemia.[place] placed on 10/19/20. -Patient has previously failed multiple transitions to HD w/ low Bps requiring pressors -No signs of meaningful recovery from our team -Family expressed ongoing wishes for aggressive care -Ethics seen 12/30 and suggested time trial of 5-7 days CV while finishing treatment for PNA - CRRT stopped 11/03/20 at 1700 - Tolerated dialysis on 1/6 to a certain extent being she still required pressors, received albumin, and no UF was obtained -Dialysis again today tolerating well -Goal will be dialysis again on Tuesday and hopefully would either remove ultrafiltration or not use norepinephrine  #Hyperkalemia: 2/2 renal failure.  Status post Lokelma with improvement.  Dialysis as above.  Consider Lokelma again tomorrow to limit hyperkalemia between dialysis sessions  # h/o hemorrhagic shock/ ABLA- ongoing blood lossTransfuse as needed, have added ESA.    # Septic shock: still requiring low-dose pressors, over max dose of midodrine, on meropenem empirically.  Mgmt per primary  # Perforated duodenal ulcer- s/p exploratory lap and Graham patch placement per surgery.  # Acute hypoxic/hypercapnic respiratory failure due to ARDS from Recent Covid-19 PNA- currently on vent via trach per PCCM. Weaning efforts have been slow progress.  # Atrial fibrillation- on hep gtt  # Severe protein malnutrition- per primary svc.   #Hypercalcemia: immobility and CKD contributing, PTH 200 and Vit D normal. Calcium okay today. Could consider Sensipar if continued issue.   # Disposition- poor overall prognosis.    Subjective:    Hyperkalemia yesterday status post Lokelma with improvement today.  On very low-dose of norepinephrine at 1.  No other significant changes.  Undergoing dialysis today received  albumin and is on low-dose norepinephrine at this time.  Tolerating dialysis well.  No ultrafiltration today.  Care plan was discussed with daughters   Objective:   BP 116/63   Pulse 84   Temp 98.6 F (37 C) (Oral)   Resp (!) 27   Ht _0  (1.727 m)   Wt 76.5 kg   SpO2 100%   BMI 25.64 kg/m   Physical Exam: Gen: lying in bed. Appears comfortable HEENT: trach in place CVS: RRR Resp: breathing is unlabored Abd: soft, nontender Ext: no LE edema, heels in protectors ACCESS: R IJ TDC  Labs: BMET Recent Labs  Lab 11/01/20 0330 11/01/20 1623 11/02/20 0220 11/02/20 1543 11/03/20 0518 11/04/20 0338 11/04/20 1843 11/05/20 0350 11/05/20 1700 11/06/20 0556 11/06/20 1716 11/07/20 0443  NA 132* 130* 131* 134* 131* 135 134* 135 136 136 138 137  K 5.0 4.6 4.5 4.3 4.6 6.1* 3.7 4.8 5.3* 6.1* 6.5* 5.6*  CL _1 104 103  CO2 _2 21* 24 23 21* 17* 19* 17*  GLUCOSE 270* 251* 272* 249* 245* 118* 117* 179* 119* 238* 205* 198*  BUN 33* 45* 54* 45* 43* 90* 57* 93* 139* 166* 189* 213*  CREATININE 0.52 0.56 0.58 0.55 0.51 0.93 0.64 0.96 1.32* 1.46* 1.66* 1.78*  CALCIUM 10.6* 9.8 9.9 10.4* 10.9* 11.5* 9.8 10.2 10.4* 9.5 9.4 9.2  PHOS 3.6 3.6 3.9 3.4 3.7 5.8* 3.5  --   --   --   --   --    CBC Recent Labs  Lab 10/31/20 1018 11/01/20 0330 11/04/20 0338 11/05/20 0350 11/06/20 0556 11/07/20 0443  WBC 23.1*   < >  23.1* 14.4* 11.0* 12.3*  NEUTROABS 16.5*  --   --   --   --   --   HGB 7.4*   < > 8.6* 7.7* 8.2* 7.3*  HCT 25.5*   < > 27.1* 24.3* 27.0* 23.2*  MCV 96.2   < > 92.8 93.5 94.7 93.9  PLT 224   < > 203 175 196 228   < > = values in this interval not displayed.      Medications:    . B-complex with vitamin C  1 tablet Per Tube Daily  . chlorhexidine gluconate (MEDLINE KIT)  15 mL Mouth Rinse BID  . Chlorhexidine Gluconate Cloth  6 each Topical Daily  . Chlorhexidine Gluconate Cloth  6 each Topical Q0600  . cholecalciferol  2,000  Units Per Tube Daily  . clonazepam  0.5 mg Per Tube TID  . darbepoetin (ARANESP) injection - DIALYSIS  100 mcg Subcutaneous Q Tue-1800  . docusate  100 mg Per Tube BID  . feeding supplement (PROSource TF)  90 mL Per Tube TID  . fentaNYL  1 patch Transdermal Q72H  . guaiFENesin  15 mL Per Tube Q12H  . heparin sodium (porcine)      .  HYDROmorphone (DILAUDID) injection  1 mg Intravenous Q6H  . influenza vaccine adjuvanted  0.5 mL Intramuscular Tomorrow-1000  . insulin aspart  0-15 Units Subcutaneous Q4H  . insulin aspart  7 Units Subcutaneous Q4H  . insulin glargine  15 Units Subcutaneous BID  . mouth rinse  15 mL Mouth Rinse 10 times per day  . midodrine  40 mg Per Tube TID WC  . nutrition supplement (JUVEN)  1 packet Per Tube BID BM  . pantoprazole sodium  40 mg Per Tube BID  . polyethylene glycol  17 g Per Tube Daily  . QUEtiapine  100 mg Per Tube QHS  . QUEtiapine  50 mg Per Tube Daily  . sodium chloride flush  10-40 mL Intracatheter Q12H    Reesa Chew  11/07/2020, 9:51 AM

## 2020-11-07 NOTE — Progress Notes (Signed)
NAMEDula [name], MRN:  [id number], DOB:  22-Feb-1951, LOS: 29 ADMISSION DATE:  08/31/2020, CONSULTATION DATE:  09/07/2020 REFERRING MD:  Dr Candiss Norse, CHIEF COMPLAINT:  Acute resp failure  Brief History   70 year old female who was previously diagnosed with Covid 08/23/2020.  Admitted 11/2 with AF-RVR, found to have a perforated duodenal underwent exploratory laparotomy with Phillip Heal patch placement 11/3.  Past Medical History  Covid pneumonia Atrial fibrillation CKD stage III Diabetes mellitus Hypertension Colon cancer Hyperlipidemia  Significant Hospital Events   11/2 Admitted  11/3 OR with findings of perforated duodenal ulcer 11/10 progressive hemorrhagic shock, intubated, transfused, pressors, proned; started on CRRT in PM 11/03 Exploratory laparotomy, Phillip Heal patch, lysis of adhesion for duodenal ulceration postop day 6 11/16 Extubated. Re-intubated overnight due to respiratory distress and hypoxia with decreased mentation 11/18 Bronch, cultures sent 11/19 Hgb down getting blood 11/24 Spiked fever resume empirical antimicrobial therapy 11/26 Hemorrhagic shock, hgb 5.6, increased pressors, CT A/P  11/30 Per palliative "Candace [name] expresses understanding that patient is unfortunately very fragile despite ongoing intensive medical treatment and full mechanical support. She indicates that the family wants to continue with all current interventions despite potential outcomes". 12/08 CRRT discontinued due to clotting 12/09 Family requested transfer to tertiary care [facility]). [facility] denied transfer  12/10 CRRT restarted. Episodes of tachycardia, tachypnea that seem to improve with pain management 12/11 Back in shock. Pressor requirements up. CXR worse. ABX resumed 12/12 Still requiring inc pressors. Had hypoglycemic event. Basal insulin dosing adjusted 12/13 Pressor requirements better. Now hyperglycemic. Re-adjusted Glycemic control  12/14 Changed dilaudid to1/2 dosing from day further. D/c  vasopressin.  Goals of care reconfirmed with daughter.  Patient continues to desire aggressive care.  Not open to discussing any other option, patient family continues to be hopeful that she will be discharged to home with full recovery in spite of multiple attempts by staff to prepare them that this is unlikely scenario 12/15 Dilaudid discontinued sending cortisol for ongoing pressor dependence 12/16 Serum cortisol <20, added stress dose steroids.  PRN Dilaudid, attempting not go back on Dilaudid infusion 12/17 Developed worsening tachycardia during the evening hours received initially IV albumin, followed by resuming IV Dilaudid with question of suboptimal pain control.  Currently looks better back on Dilaudid drip.  We have been able to wean pressors after adding stress dose steroids; near arrest - bradycardia, better with atropine  12/19 Afebrile . Remains on dilaudid and heparin gtt, dilaudid gtt increased overnight for concern of pain / ongoing tachycardia, no other events . NE and precedex off 12/17. Ongoing CRRT- even UF, dosing lokelmia this morning 12/20 On CRRT.  Renal plans for HD cath and moving to HD. Getting wound care  12/23 -No vent weaning per RT, remains on full support, #8 trach in place but previously had #6, no documentation of change noted in chart Afebrile / WBC 20.4  Vent - 30% FiO2, PEEP 5 Glucose range 176-212 I/O 465 ml stool, 3.6L removed with HD, -1.1L in last 24 hours  RN reports ongoing periods of tachypnea / vent dyssynchrony that responds to dilaudid   12/24 - renal stopping CRRT today and plans HD 10/24/20 . 40% fio2 on vent via Trach. TAchypenic and tachycardic. Afebrile but wbc up to 27.6K. On TF. Onn heparin gtt  12/25 - Back on CRRT. On vent via trach at 40% fio2, On scheduled dilaudid as add on to oxy. Per family request 12/24 - they felt scheduled oxy was not adequate and patient was showing  signs of opioid withdrawal.  Patient also had  worsening SIRS/sepsis  syndrome. Had fever, rising wbc, worsening RR 40-60 and HR 140s sinus-> started On abx yesterday. Fever some better today. WBC plateau at 28,.5K. On new levophed -> signifanct improvement in HR 77 and RR t0 20. On heparin gtt.  On precedex gtt. On levophed gtt 58mcg wthi midodrine. On TF 12/30 Remains on CRRT with intermittent pressor requirements. Ethics consult placed evening of 12/29. Ethics rec time trial of CRRT 12/31 failed SBT with RR 40s. Several conversations between care tam and family, who are upset RE plan of care 1/1 back on pressors  1/4: On pressors, keeping even on CVVHD, HGB drop to 6.9, transfused 1 unit. Improving mental status  Consults:  Cardiology [facility] Nephrology  Ethics  Procedures:  R PICC 11/5 >> A line 11/9 >> out ETT 11/9 > 11/16, 11/16 >> 09/21/2020, 09/21/2020 tracheostomy>> Lt [place] CVL 11/9 >> R IJ trialysis >> out HD catheter 12/1 >>12/20 12/21 - 14.5 Fr, 23 cm right IJ tunneled hemodialysis catheter placement.  Removal of indwelling subclavian catheter.   Significant Diagnostic Tests:  11/3 CT abd/ pelvis > 1. Positive for bowel perforation: Pneumoperitoneum and intermediate density free fluid in the abdomen. Prior total colectomy. The specific site of perforation is unclear-oral contrast present to the proximal jejunum has not obviously leaked. Note that there may be small bowel loops adherent to the ventral abdominal wall along the greater curve of the stomach. 2. Extensive bilateral lower lung pneumonia. No pleural effusion. 3. Other abdominal and pelvic viscera are stable since 2015, including bilateral adrenal adenomas. Chronic renal parapelvic cysts. 4. Aortic Atherosclerosis 11/3 TTE > EF 70-75%, RV not well visualized, mildly reduced RV systolic function 17/79 CT chest/ abd/ pelvis> 1. Interval progression of diffuse bilateral hazy ground-glass airspace opacities with more focal areas of consolidation at the lung bases 2. Trace bilateral pleural  effusions. 3. Postsurgical changes the abdomen as detailed above. No evidence for a postoperative abscess, however evaluation is limited by lack of IV contrast. 4. There is a 1.9 cm cystic appearing lesion located in the pancreatic body. This was not present on the patient's CT from 2015.  Follow-up with an outpatient contrast enhanced MRI is recommended. 5. The endometrial stripe appears diffusely thickened. Follow-up with pelvic ultrasound is recommended. Aortic Atherosclerosis 11/14 LE doppler studies > + DVT of right posterior tibial and peroneal vein, +dVT of left posterior tibial vein  11/26 CT ABD PEL > liver unremarkable, distended gallbladder with layering tiny gallstones versus sludge, no duct dilatation, mild hyperdensity right upper pole renal collecting system new.  No evidence of retroperitoneal bleeding.  Multifocal lower lobe predominant pulmonary infiltrates/pneumonia.  Small left pleural effusion.  Micro Data:  11/10 MRSA PCR > neg 11/10 BC x 2 > neg 09/22/2020 blood cultures x2>> S epi.  09/22/2020 sputum culture>> MRSA Blood 12/1 >> negative. S.epi 1 out of 2, likely contaminant BCx 2 12/12 >> neg xxx Trach aspirate 12/24 - FEW STAPHYLOCOCCUS AUREUS  Blood 12/24 > Negative  Blood 1/2: Sputum 1/2:  Antimicrobials:  azithro 11/2 >11/3 Ceftriaxone 11/2  Fluconazole 11/3 Zosyn 11/3 >> 11/7 Vanc 11/10 off Cefepime 11/10 > 11/16 09/22/2020 vancomycin for MRSA PNA >> 12/2 xxxx Vanc 12/11> 12/17 Zosyn 12/11> 12/17 xxxx vanc 12/24 (staph resp cutlure) >> 12/28 Zosyn 12/24 - 12/27 Cefazolin 12/27 >>1/1 linezolid 1/2 merrem 1/2 eraxis 1/2   Subjective:  No acute events overnight. Daughter at the bedside.  Levophed continues to wean  slowly.  iHD occurring this AM  Objective   Blood pressure 111/64, pulse 80, temperature 98.6 F (37 C), temperature source Oral, resp. rate (!) 28, height 5\' 8"  (1.727 m), weight 76.5 kg, SpO2 100 %.    Vent Mode: PRVC FiO2  (%):  [40 %] 40 % Set Rate:  [28 bmp] 28 bmp Vt Set:  [450 mL] 450 mL PEEP:  [5 cmH20] 5 cmH20 Plateau Pressure:  [24 cmH20] 24 cmH20   Intake/Output Summary (Last 24 hours) at 11/07/2020 0927 Last data filed at 11/07/2020 0500 Gross per 24 hour  Intake 1864.89 ml  Output 650 ml  Net 1214.89 ml   Filed Weights   11/04/20 0337 11/04/20 1355 11/04/20 1645  Weight: 76.5 kg 76.5 kg 76.5 kg   Physical Exam  General: Chronically and critically ill debilitated F, upright in bed HEENT: trach secure. Anicteric sclera. Moist mucous membranes. Neuro: Opens eyes, nods head to yes no questions.  CV: s1s2 no rgm cap refill < 3 seconds  PULM:  No wheezing or rhonchi. Mildly course breath sounds. GI: soft round ndnt., stoma is pink, tan stool noted, and dressing is clean dry and intact  Extremities: Decreased muscle tone BUE BLE. BLE boots.  Skin: c/d/w. Sacral wound.   Resolved problems:  Previously hemorrhagic shock requiring PRBCs, Septic shock, MRSA pneumonia s/p 8 days abx Complicated by MRSA healthcare associated pneumonia (treated) Peritonitis and perforated ulcer post repair 09/01/20 MSSA HAP  Assessment:  Candace [name] is a 69 y.o. with history of COVId 19 infection requiring hospitalization in October 2021 with current admission for hemorrhagic shock secondary to perforated gastric ulcer requiring surgical intervention and [place]. Her medical issues are as follows:  Acute metabolic encephalopathy  Prolonged critical illness, sedation/pain - wean precedex as able - still waxing and waning - Reduced clonazepam from 1mg  TID to 0.5mg  TID 1/8 - Continue dilaudid 1mg  q6hrs, fentanyl patch 16mcg - Has been on seroquel 100mg  BID. Reduced AM dose to 50mg  1/8.   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  - iHD started 1/6, most recent session this morning. Still not pulling fluid. Next session will be to try pulling fluid. Still requiring some levophed though.   Afib/DVT HGB drop 1/4 to 6.9>> received 1 unit PRBC No obvious bleeding, suspect nosocomial losses with chronic disease - continue heparin drip per pharmacy - Monitor for bleeding/ CBC daily. No signs today  Critical Illness Myopathy Severe deconditioning  Inadequate PO intake  - PT following - EN per RDN  DM2 with hyperglycemia - Will continue lantus 15units BID - Will continue Q 4 coverage to 7 units  Stage IV sacral ulcer -WOC, hydrotherapy, dakins   History of stage IV colon cancer 1.9 cm cystic appearing lesion located in the pancreatic body  GOC -multiple [place] conversations with various PCCM providers and family have not yielded changes in scope of tx, multiple PCCM providers have been fired by family.  -concerns for futility of certain interventions -ethics consulted 12/30-- recs for time limited trial of  CRRT, please see separate Ethics consult note for full details. Futility policy reviewed with family by ethics. Ongoing meetings with Dr. Hulen Skains with nursing administration and family. The ultimate goal is to transition her safely to HD from CRRT which has proven difficult thus far.   Best practice:  Diet: EN  Pain/Anxiety/Delirium protocol (if indicated): titrating.  VAP protocol (if indicated): yes DVT prophylaxis: IV heparin GI prophylaxis: Protonix Glucose control: SSI, TF coverage, lantus Mobility: PT Family communication: updated 11/08/19 by Dr. Erin Fulling, daughter at bedside  The patient is critically ill  with multiple organ systems failure and requires high complexity decision making for assessment and support, frequent evaluation and titration of therapies, application of advanced monitoring technologies and extensive interpretation of multiple databases.   Critical Care Time devoted to patient care services described in this note is 40 minutes. This time reflects time of care of this [place] . This critical care time does not reflect separately billable procedures or procedure time, teaching time or supervisory time of PA/NP/Med student/Med Resident etc but could involve care discussion time.  Freda Jackson, MD [facility] Office: [phone]   See Amion for Pager Details

## 2020-11-08 ENCOUNTER — Inpatient Hospital Stay (HOSPITAL_COMMUNITY): Payer: Medicare Other

## 2020-11-08 DIAGNOSIS — N186 End stage renal disease: Secondary | ICD-10-CM | POA: Diagnosis not present

## 2020-11-08 DIAGNOSIS — Z930 Tracheostomy status: Secondary | ICD-10-CM

## 2020-11-08 DIAGNOSIS — R579 Shock, unspecified: Secondary | ICD-10-CM | POA: Diagnosis not present

## 2020-11-08 DIAGNOSIS — J9621 Acute and chronic respiratory failure with hypoxia: Secondary | ICD-10-CM | POA: Diagnosis not present

## 2020-11-08 DIAGNOSIS — I4891 Unspecified atrial fibrillation: Secondary | ICD-10-CM | POA: Diagnosis not present

## 2020-11-08 LAB — BASIC METABOLIC PANEL
Anion gap: 15 (ref 5–15)
Anion gap: 14 (ref 5–15)
BUN: 121 mg/dL — ABNORMAL HIGH (ref 8–23)
BUN: 135 mg/dL — ABNORMAL HIGH (ref 8–23)
CO2: 21 mmol/L — ABNORMAL LOW (ref 22–32)
CO2: 21 mmol/L — ABNORMAL LOW (ref 22–32)
Calcium: 8.9 mg/dL (ref 8.9–10.3)
Calcium: 9.2 mg/dL (ref 8.9–10.3)
Chloride: 99 mmol/L (ref 98–111)
Chloride: 100 mmol/L (ref 98–111)
Creatinine, Ser: 1.21 mg/dL — ABNORMAL HIGH (ref 0.44–1.00)
Creatinine, Ser: 1.33 mg/dL — ABNORMAL HIGH (ref 0.44–1.00)
GFR, Estimated: 49 mL/min — ABNORMAL LOW (ref >60)
GFR, Estimated: 43 mL/min — ABNORMAL LOW (ref >60)
Glucose, Bld: 168 mg/dL — ABNORMAL HIGH (ref 70–99)
Glucose, Bld: 70 mg/dL (ref 70–99)
Potassium: 4 mmol/L (ref 3.5–5.1)
Potassium: 4.2 mmol/L (ref 3.5–5.1)
Sodium: 135 mmol/L (ref 135–145)
Sodium: 135 mmol/L (ref 135–145)

## 2020-11-08 LAB — CBC
HCT: 24.8 % — ABNORMAL LOW (ref 36.0–46.0)
Hemoglobin: 7.4 g/dL — ABNORMAL LOW (ref 12.0–15.0)
MCH: 28.8 pg (ref 26.0–34.0)
MCHC: 29.8 g/dL — ABNORMAL LOW (ref 30.0–36.0)
MCV: 96.5 fL (ref 80.0–100.0)
Platelets: 233 10*3/uL (ref 150–400)
RBC: 2.57 MIL/uL — ABNORMAL LOW (ref 3.87–5.11)
RDW: 18.2 % — ABNORMAL HIGH (ref 11.5–15.5)
WBC: 11.4 10*3/uL — ABNORMAL HIGH (ref 4.0–10.5)
nRBC: 0.6 % — ABNORMAL HIGH (ref 0.0–0.2)

## 2020-11-08 LAB — MAGNESIUM: Magnesium: 1.8 mg/dL (ref 1.7–2.4)

## 2020-11-08 LAB — GLUCOSE, CAPILLARY
Glucose-Capillary: 170 mg/dL — ABNORMAL HIGH (ref 70–99)
Glucose-Capillary: 164 mg/dL — ABNORMAL HIGH (ref 70–99)
Glucose-Capillary: 148 mg/dL — ABNORMAL HIGH (ref 70–99)
Glucose-Capillary: 134 mg/dL — ABNORMAL HIGH (ref 70–99)
Glucose-Capillary: 90 mg/dL (ref 70–99)
Glucose-Capillary: 126 mg/dL — ABNORMAL HIGH (ref 70–99)
Glucose-Capillary: 136 mg/dL — ABNORMAL HIGH (ref 70–99)
Glucose-Capillary: 57 mg/dL — ABNORMAL LOW (ref 70–99)
Glucose-Capillary: 78 mg/dL (ref 70–99)

## 2020-11-08 LAB — HEPARIN LEVEL (UNFRACTIONATED): Heparin Unfractionated: 0.29 IU/mL — ABNORMAL LOW (ref 0.30–0.70)

## 2020-11-08 MED ORDER — DEXTROSE 50 % IV SOLN: 1.0000 | Freq: Once | INTRAVENOUS | Status: AC

## 2020-11-08 MED ORDER — DEXTROSE 10 % IV SOLN: INTRAVENOUS | Status: DC

## 2020-11-08 MED ORDER — DARBEPOETIN ALFA 100 MCG/0.5ML IJ SOSY
100.0000 ug | PREFILLED_SYRINGE | INTRAMUSCULAR | Status: DC
  Filled 2020-11-08 (×3): qty 0.5

## 2020-11-08 MED ORDER — DEXTROSE 50 % IV SOLN
INTRAVENOUS | Status: AC
  Administered 2020-11-08: 50 mL via INTRAVENOUS
  Filled 2020-11-08: qty 50

## 2020-11-08 NOTE — Progress Notes (Incomplete)
Notified provider patient vomited second time. Tube feeds remain on hold and

## 2020-11-08 NOTE — Progress Notes (Signed)
[facility] Progress Note   Assessment/ Plan:   # AKI- oliguric now likely anuric. ATN for shock.Started on CRRT 09/08/20 due to persistent oliguria/anuria and hyperkalemia.[place] placed on 10/19/20. -Patient has previously failed multiple transitions to HD w/ low Bps requiring pressors -No signs of meaningful recovery from our team -Family expressed ongoing wishes for aggressive care -Ethics seen 12/30 and suggested time trial of 5-7 days CV while finishing treatment for PNA - CRRT stopped 11/03/20 at 1700 - still requiring some pressors with HD -Goal will be dialysis again on Tuesday and hopefully would either remove ultrafiltration or not use norepinephrine  #Hyperkalemia: 2/2 renal failure.  Status post Lokelma with improvement.  Dialysis as above.  Consider Lokelma again tomorrow to limit hyperkalemia between dialysis sessions  # h/o hemorrhagic shock/ ABLA- ongoing blood lossTransfuse as needed, have added ESA.    # Septic shock: still requiring low-dose pressors, over max dose of midodrine, on meropenem empirically.  Mgmt per primary  # Perforated duodenal ulcer- s/p exploratory lap and Graham patch placement per surgery.  # Acute hypoxic/hypercapnic respiratory failure due to ARDS from Recent Covid-19 PNA- currently on vent via trach per PCCM. Weaning efforts have been slow progress.  # Atrial fibrillation- on hep gtt  # Severe protein malnutrition- per primary svc.   #Hypercalcemia: immobility and CKD contributing, PTH 200 and Vit D normal. Calcium okay today. Could consider Sensipar if continued issue.   # Disposition- poor overall prognosis.    Subjective:    No acute events. K stable 4.0 this AM. Dialysis yesterday with no ultrafiltration given pressor requirements   Objective:   BP 123/61   Pulse 91   Temp 98.7 F (37.1 C) (Axillary)   Resp (!) 22   Ht _0  (1.727 m)   Wt 82.7 kg   SpO2 100%   BMI 27.72 kg/m   Physical Exam: Gen:  lying in bed, nad HEENT: trach in place CVS: RRR Resp: breathing is unlabored Abd: soft, nontender Ext: no LE edema, heels in protectors ACCESS: R IJ TDC  Labs: BMET Recent Labs  Lab 11/01/20 1623 11/02/20 0220 11/02/20 1543 11/03/20 0518 11/04/20 0338 11/04/20 1843 11/05/20 0350 11/05/20 1700 11/06/20 0556 11/06/20 1716 11/07/20 0443 11/07/20 1645 11/08/20 0500  NA 130* 131* 134* 131* 135 134* 135 136 136 138 137 135 135  K 4.6 4.5 4.3 4.6 6.1* 3.7 4.8 5.3* 6.1* 6.5* 5.6* 3.6 4.0  CL _1 103 100 99  CO2 _2 21* 24 23 21* 17* 19* 17* 24 21*  GLUCOSE 251* 272* 249* 245* 118* 117* 179* 119* 238* 205* 198* 151* 168*  BUN 45* 54* 45* 43* 90* 57* 93* 139* 166* 189* 213* 101* 121*  CREATININE 0.56 0.58 0.55 0.51 0.93 0.64 0.96 1.32* 1.46* 1.66* 1.78* 0.99 1.21*  CALCIUM 9.8 9.9 10.4* 10.9* 11.5* 9.8 10.2 10.4* 9.5 9.4 9.2 8.1* 8.9  PHOS 3.6 3.9 3.4 3.7 5.8* 3.5  --   --   --   --   --   --   --    CBC Recent Labs  Lab 11/05/20 0350 11/06/20 0556 11/07/20 0443 11/08/20 0500  WBC 14.4* 11.0* 12.3* 11.4*  HGB 7.7* 8.2* 7.3* 7.4*  HCT 24.3* 27.0* 23.2* 24.8*  MCV 93.5 94.7 93.9 96.5  PLT 175 196 228 233      Medications:    . B-complex with vitamin C  1 tablet Per Tube  Daily  . chlorhexidine gluconate (MEDLINE KIT)  15 mL Mouth Rinse BID  . Chlorhexidine Gluconate Cloth  6 each Topical Daily  . Chlorhexidine Gluconate Cloth  6 each Topical Q0600  . cholecalciferol  2,000 Units Per Tube Daily  . clonazepam  0.5 mg Per Tube TID  . [START ON 11/09/2020] darbepoetin (ARANESP) injection - DIALYSIS  100 mcg Intravenous Q Tue-HD  . docusate  100 mg Per Tube BID  . feeding supplement (PROSource TF)  90 mL Per Tube TID  . fentaNYL  1 patch Transdermal Q72H  . guaiFENesin  15 mL Per Tube Q12H  .  HYDROmorphone (DILAUDID) injection  1 mg Intravenous Q6H  . influenza vaccine adjuvanted  0.5 mL Intramuscular Tomorrow-1000  . insulin  aspart  0-15 Units Subcutaneous Q4H  . insulin aspart  7 Units Subcutaneous Q4H  . insulin glargine  15 Units Subcutaneous BID  . mouth rinse  15 mL Mouth Rinse 10 times per day  . midodrine  40 mg Per Tube TID WC  . nutrition supplement (JUVEN)  1 packet Per Tube BID BM  . pantoprazole sodium  40 mg Per Tube BID  . polyethylene glycol  17 g Per Tube Daily  . QUEtiapine  100 mg Per Tube QHS  . QUEtiapine  50 mg Per Tube Daily  . sodium chloride flush  10-40 mL Intracatheter Q12H    Candace [name]  11/08/2020, 12:27 PM

## 2020-11-08 NOTE — Progress Notes (Signed)
ANTICOAGULATION CONSULT NOTE  Pharmacy Consult:  Heparin Indication: atrial fibrillation   Labs: Recent Labs    11/06/20 0556 11/06/20 1716 11/07/20 0442 11/07/20 0443 11/07/20 1645 11/08/20 0500  HGB 8.2*  --   --  7.3*  --  7.4*  HCT 27.0*  --   --  23.2*  --  24.8*  PLT 196  --   --  228  --  233  HEPARINUNFRC 0.32  --  0.48  --   --  0.29*  CREATININE 1.46*   < >  --  1.78* 0.99 1.21*   < > = values in this interval not displayed.    Assessment: 70 yo female with history of Afib on apixaban prior to admission, which has been held and patient transitioned to IV heparin.   Heparin level of 0.29 is just below goal range of 0.3-0.7 and subtherapeutic on heparin 1250 units/hr. Hgb 7.4. Per RN no issues with infusion or access and no bleeding noted.   Goal of Therapy:  Heparin level 0.3-0.5 units/ml   Plan:  Increase heparin at 1300 units/hr to ensure remains in goal range  Monitor daily heparin level and CBC  Cristela Felt, PharmD Clinical Pharmacist  11/08/2020, 6:52 AM

## 2020-11-08 NOTE — Progress Notes (Signed)
Notified provider patient vomited tube feed while being repositioned. Tube feeds turned off and to be held for one hour.

## 2020-11-08 NOTE — Progress Notes (Signed)
Physical Therapy Wound Treatment Patient Details  Name: Candace [name] MRN: [id number] Date of Birth: 02-04-1951  Today's Date: 11/08/2020 Time: 4935-5217 Time Calculation (min): 36 min  Subjective  Subjective: Alert on vent Patient and Family Stated Goals: Heal wound; maintain pt comfort per daughter Date of Onset:  (unknown)  Pain Score:  2/10  Wound Assessment  Pressure Injury 09/25/20 Sacrum Bilateral;Medial Deep Tissue Pressure Injury - Purple or maroon localized area of discolored intact skin or blood-filled blister due to damage of underlying soft tissue from pressure and/or shear. Purple, non-blanchable, b (Active)  Dressing Type ABD;Barrier Film (skin prep);Moist to moist;Gauze (Comment) 11/08/20 0936  Dressing Changed;Dry;Clean;Intact 11/08/20 0936  Dressing Change Frequency Daily 11/08/20 0936  State of Healing Eschar 11/08/20 0936  Site / Wound Assessment Yellow;Pink;Black 11/08/20 0936  % Wound base Red or Granulating 20% 11/06/20 1242  % Wound base Yellow/Fibrinous Exudate 70% 11/06/20 1242  % Wound base Black/Eschar 10% 11/06/20 1242  % Wound base Other/Granulation Tissue (Comment) 0% 11/06/20 1242  Peri-wound Assessment Excoriated 11/06/20 1242  Wound Length (cm) 16 cm 11/05/20 1019  Wound Width (cm) 11 cm 11/05/20 1019  Wound Depth (cm) 5 cm 11/05/20 1019  Wound Surface Area (cm^2) 176 cm^2 11/05/20 1019  Wound Volume (cm^3) 880 cm^3 11/05/20 1019  Tunneling (cm) 0 10/13/20 1200  Undermining (cm) 0 10/13/20 1200  Margins Unattached edges (unapproximated) 11/08/20 0936  Drainage Amount Minimal 11/08/20 0936  Drainage Description Serosanguineous 11/08/20 0936  Treatment Debridement (Selective);Hydrotherapy (Pulse lavage);Packing (Saline gauze) 11/08/20 0936  Hydrotherapy Pulsed lavage therapy - wound location: sacrum Pulsed Lavage with Suction (psi): 12 psi Pulsed Lavage with Suction - Normal Saline Used: 1000 mL Pulsed Lavage Tip: Tip with splash  shield Selective Debridement Selective Debridement - Location: sacrum Selective Debridement - Tools Used: Forceps;Scalpel Selective Debridement - Tissue Removed: necrotic adipose tissue and eschar   Wound Assessment and Plan  Wound Therapy - Assess/Plan/Recommendations Wound Therapy - Clinical Statement: Continued slow removal of necrotic adipose tissue. Daughter present and questions answered within scope of practice. Will continue to follow for selective removal of unviable tissue, to decrease bioburden and promote wound bed healing. Wound Therapy - Functional Problem List: Global weakness and immobility Factors Delaying/Impairing Wound Healing: Diabetes Mellitus;Immobility;Multiple medical problems Hydrotherapy Plan: Debridement;Dressing change;Patient/family education;Pulsatile lavage with suction Wound Therapy - Frequency: 6X / week Wound Therapy - Follow Up Recommendations: Skilled nursing facility Wound Plan: See above  Wound Therapy Goals- Improve the function of patient's integumentary system by progressing the wound(s) through the phases of wound healing (inflammation - proliferation - remodeling) by: Decrease Necrotic Tissue to: 60 Decrease Necrotic Tissue - Progress: Progressing toward goal Increase Granulation Tissue to: 40 Increase Granulation Tissue - Progress: Progressing toward goal Goals/treatment plan/discharge plan were made with and agreed upon by patient/family: No, Patient unable to participate in goals/treatment/discharge plan and family unavailable Time For Goal Achievement: 7 days Wound Therapy - Potential for Goals: Fair  Goals will be updated until maximal potential achieved or discharge criteria met.  Discharge criteria: when goals achieved, discharge from hospital, MD decision/surgical intervention, no progress towards goals, refusal/missing three consecutive treatments without notification or medical reason.  GP    [name], PT, DPT Acute  Rehabilitation Services Pager [phone] Office [phone]  [name] 11/08/2020, 9:39 AM

## 2020-11-08 NOTE — Progress Notes (Signed)
Hypoglycemic Event  CBG: 57  Treatment: 1 Amp D50  Symptoms: Non  Follow-up CBG: Time:1850 CBG Result: 137  Possible Reasons for Event: NPO/Tube feeds being held.  Comments/MD notified: P. Hoffman notified - will start D10 IV drip.    Candace [name]

## 2020-11-08 NOTE — Plan of Care (Signed)
  Problem: Education: Goal: Utilization of techniques to improve thought processes will improve Outcome: Progressing Goal: Knowledge of the prescribed therapeutic regimen will improve Outcome: Progressing   Problem: Activity: Goal: Imbalance in normal sleep/wake cycle will improve Outcome: Progressing   Problem: Coping: Goal: Coping ability will improve Outcome: Progressing Goal: Will verbalize feelings Outcome: Progressing   Problem: Health Behavior/Discharge Planning: Goal: Ability to make decisions will improve Outcome: Progressing   Problem: Role Relationship: Goal: Will demonstrate positive changes in social behaviors and relationships Outcome: Progressing   Problem: Safety: Goal: Ability to identify and utilize support systems that promote safety will improve Outcome: Progressing   Problem: Self-Concept: Goal: Level of anxiety will decrease Outcome: Progressing   Problem: Education: Goal: Knowledge of General Education information will improve Description: Including pain rating scale, medication(s)/side effects and non-pharmacologic comfort measures Outcome: Progressing   Problem: Clinical Measurements: Goal: Ability to maintain clinical measurements within normal limits will improve Outcome: Progressing Goal: Will remain free from infection Outcome: Progressing Goal: Diagnostic test results will improve Outcome: Progressing Goal: Respiratory complications will improve Outcome: Progressing Goal: Cardiovascular complication will be avoided Outcome: Progressing   Problem: Activity: Goal: Risk for activity intolerance will decrease Outcome: Progressing   Problem: Nutrition: Goal: Adequate nutrition will be maintained Outcome: Progressing   Problem: Coping: Goal: Level of anxiety will decrease Outcome: Progressing   Problem: Elimination: Goal: Will not experience complications related to bowel motility Outcome: Progressing Goal: Will not experience  complications related to urinary retention Outcome: Progressing   Problem: Pain Managment: Goal: General experience of comfort will improve Outcome: Progressing   Problem: Safety: Goal: Ability to remain free from injury will improve Outcome: Progressing   Problem: Skin Integrity: Goal: Risk for impaired skin integrity will decrease Outcome: Progressing   Problem: Activity: Goal: Ability to tolerate increased activity will improve Outcome: Progressing   Problem: Respiratory: Goal: Ability to maintain a clear airway and adequate ventilation will improve Outcome: Progressing   Problem: Role Relationship: Goal: Method of communication will improve Outcome: Progressing   Problem: Fluid Volume: Goal: Compliance with measures to maintain balanced fluid volume will improve Outcome: Progressing   Problem: Health Behavior/Discharge Planning: Goal: Ability to manage health-related needs will improve Outcome: Progressing   Problem: Nutritional: Goal: Ability to make healthy dietary choices will improve Outcome: Progressing   Problem: Clinical Measurements: Goal: Complications related to the disease process, condition or treatment will be avoided or minimized Outcome: Progressing   Problem: Education: Goal: Knowledge of risk factors and measures for prevention of condition will improve Outcome: Progressing   Problem: Coping: Goal: Psychosocial and spiritual needs will be supported Outcome: Progressing   Problem: Respiratory: Goal: Will maintain a patent airway Outcome: Progressing Goal: Complications related to the disease process, condition or treatment will be avoided or minimized Outcome: Progressing

## 2020-11-08 NOTE — Progress Notes (Signed)
Notified provider patient vomited second time, tube feeds still on hold. PRN zofran given with some relief. Provider instructed to keep tube feeds off and will obtain KUB.

## 2020-11-08 NOTE — Consult Note (Signed)
Attempted to see patient for weekly assessment with PT, however when I arrived on the unit PT has finished therapy. Planned joint collaborative visit for wound assessment tomorrow 11/09/20.   [place], [place], [place]

## 2020-11-08 NOTE — Progress Notes (Signed)
eLink Physician-Brief Progress Note Patient Name: Candace [name] DOB: 1951-03-04 MRN: [id number]   Date of Service  11/08/2020  HPI/Events of Note  Tachy 118. Stable BP as per RN. A fib.   eICU Interventions  Get K/mag and call if low     Intervention Category Intermediate Interventions: Arrhythmia - evaluation and management  Elmer Sow 11/08/2020, 12:31 AM

## 2020-11-08 NOTE — Progress Notes (Signed)
Assisted tele visit to patient with daughter.  Sedonia Small, RN

## 2020-11-08 NOTE — Progress Notes (Signed)
Brief Nutrition Note  Notified by RN that pt is vomiting tube feeds. Per RN note from 1305, tube feeds turned off and to be held for one hour.  Recommend resuming Vital 1.5 tube feeds at 25 ml/hr and advancing by 10 ml q 4 hours back to goal rate of 65 ml/hr.   Candace Bryant, MS, RD, LDN Inpatient Clinical Dietitian Please see [facility] for contact information.

## 2020-11-08 NOTE — Progress Notes (Signed)
NAMECloe [name], MRN:  [id number], DOB:  Mar 19, 1951, LOS: 45 ADMISSION DATE:  08/31/2020, CONSULTATION DATE:  09/07/2020 REFERRING MD:  Dr Candiss Norse, CHIEF COMPLAINT:  Acute resp failure  Brief History   70 year old female who was previously diagnosed with Covid 08/23/2020.  Admitted 11/2 with AF-RVR, found to have a perforated duodenal underwent exploratory laparotomy with Phillip Heal patch placement 11/3.  Past Medical History  Covid pneumonia Atrial fibrillation CKD stage III Diabetes mellitus Hypertension Colon cancer Hyperlipidemia  Significant Hospital Events   11/2 Admitted  11/3 OR with findings of perforated duodenal ulcer 11/10 progressive hemorrhagic shock, intubated, transfused, pressors, proned; started on CRRT in PM 11/03 Exploratory laparotomy, Phillip Heal patch, lysis of adhesion for duodenal ulceration postop day 6 11/16 Extubated. Re-intubated overnight due to respiratory distress and hypoxia with decreased mentation 11/18 Bronch, cultures sent 11/19 Hgb down getting blood 11/24 Spiked fever resume empirical antimicrobial therapy 11/26 Hemorrhagic shock, hgb 5.6, increased pressors, CT A/P  11/30 Per palliative "[name] expresses understanding that patient is unfortunately very fragile despite ongoing intensive medical treatment and full mechanical support. She indicates that the family wants to continue with all current interventions despite potential outcomes". 12/08 CRRT discontinued due to clotting 12/09 Family requested transfer to tertiary care [facility]). [facility] denied transfer  12/10 CRRT restarted. Episodes of tachycardia, tachypnea that seem to improve with pain management 12/11 Back in shock. Pressor requirements up. CXR worse. ABX resumed 12/12 Still requiring inc pressors. Had hypoglycemic event. Basal insulin dosing adjusted 12/13 Pressor requirements better. Now hyperglycemic. Re-adjusted Glycemic control  12/14 Changed dilaudid to1/2 dosing from day further. D/c  vasopressin.  Goals of care reconfirmed with daughter.  Patient continues to desire aggressive care.  Not open to discussing any other option, patient family continues to be hopeful that she will be discharged to home with full recovery in spite of multiple attempts by staff to prepare them that this is unlikely scenario 12/15 Dilaudid discontinued sending cortisol for ongoing pressor dependence 12/16 Serum cortisol <20, added stress dose steroids.  PRN Dilaudid, attempting not go back on Dilaudid infusion 12/17 Developed worsening tachycardia during the evening hours received initially IV albumin, followed by resuming IV Dilaudid with question of suboptimal pain control.  Currently looks better back on Dilaudid drip.  We have been able to wean pressors after adding stress dose steroids; near arrest - bradycardia, better with atropine  12/19 Afebrile . Remains on dilaudid and heparin gtt, dilaudid gtt increased overnight for concern of pain / ongoing tachycardia, no other events . NE and precedex off 12/17. Ongoing CRRT- even UF, dosing lokelmia this morning 12/20 On CRRT.  Renal plans for HD cath and moving to HD. Getting wound care  12/23 -No vent weaning per RT, remains on full support, #8 trach in place but previously had #6, no documentation of change noted in chart Afebrile / WBC 20.4  Vent - 30% FiO2, PEEP 5 Glucose range 176-212 I/O 465 ml stool, 3.6L removed with HD, -1.1L in last 24 hours  RN reports ongoing periods of tachypnea / vent dyssynchrony that responds to dilaudid   12/24 - renal stopping CRRT today and plans HD 10/24/20 . 40% fio2 on vent via Trach. TAchypenic and tachycardic. Afebrile but wbc up to 27.6K. On TF. Onn heparin gtt  70/25 - Back on CRRT. On vent via trach at 40% fio2, On scheduled dilaudid as add on to oxy. Per family request 12/24 - they felt scheduled oxy was not adequate and patient was showing  signs of opioid withdrawal.  Patient also had  worsening SIRS/sepsis  syndrome. Had fever, rising wbc, worsening RR 40-60 and HR 140s sinus-> started On abx yesterday. Fever some better today. WBC plateau at 28,.5K. On new levophed -> signifanct improvement in HR 77 and RR t0 20. On heparin gtt.  On precedex gtt. On levophed gtt 52mcg wthi midodrine. On TF 12/30 Remains on CRRT with intermittent pressor requirements. Ethics consult placed evening of 12/29. Ethics rec time trial of CRRT 12/31 failed SBT with RR 40s. Several conversations between care tam and family, who are upset RE plan of care 1/1 back on pressors  1/4: On pressors, keeping even on CVVHD, HGB drop to 6.9, transfused 1 unit. Improving mental status 1/10 remains on low dose levophed 84mcg  Consults:  Cardiology [facility] Nephrology  Ethics  Procedures:  R PICC 11/5 >> A line 11/9 >> out ETT 11/9 > 11/16, 11/16 >> 09/21/2020, 09/21/2020 tracheostomy>> Lt Ralls CVL 11/9 >> R IJ trialysis >> out HD catheter 12/1 >>12/20 12/21 - 14.5 Fr, 23 cm right IJ tunneled hemodialysis catheter placement.  Removal of indwelling subclavian catheter.   Significant Diagnostic Tests:  11/3 CT abd/ pelvis > 1. Positive for bowel perforation: Pneumoperitoneum and intermediate density free fluid in the abdomen. Prior total colectomy. The specific site of perforation is unclear-oral contrast present to the proximal jejunum has not obviously leaked. Note that there may be small bowel loops adherent to the ventral abdominal wall along the greater curve of the stomach. 2. Extensive bilateral lower lung pneumonia. No pleural effusion. 3. Other abdominal and pelvic viscera are stable since 2015, including bilateral adrenal adenomas. Chronic renal parapelvic cysts. 4. Aortic Atherosclerosis 11/3 TTE > EF 70-75%, RV not well visualized, mildly reduced RV systolic function 02/54 CT chest/ abd/ pelvis> 1. Interval progression of diffuse bilateral hazy ground-glass airspace opacities with more focal areas of consolidation at the  lung bases 2. Trace bilateral pleural effusions. 3. Postsurgical changes the abdomen as detailed above. No evidence for a postoperative abscess, however evaluation is limited by lack of IV contrast. 4. There is a 1.9 cm cystic appearing lesion located in the pancreatic body. This was not present on the patient's CT from 2015.  Follow-up with an outpatient contrast enhanced MRI is recommended. 5. The endometrial stripe appears diffusely thickened. Follow-up with pelvic ultrasound is recommended. Aortic Atherosclerosis 11/14 LE doppler studies > + DVT of right posterior tibial and peroneal vein, +dVT of left posterior tibial vein  11/26 CT ABD PEL > liver unremarkable, distended gallbladder with layering tiny gallstones versus sludge, no duct dilatation, mild hyperdensity right upper pole renal collecting system new.  No evidence of retroperitoneal bleeding.  Multifocal lower lobe predominant pulmonary infiltrates/pneumonia.  Small left pleural effusion.  Micro Data:  11/10 MRSA PCR > neg 11/10 BC x 2 > neg 09/22/2020 blood cultures x2>> S epi.  09/22/2020 sputum culture>> MRSA Blood 12/1 >> negative. S.epi 1 out of 2, likely contaminant BCx 2 12/12 >> neg xxx Trach aspirate 12/24 - FEW STAPHYLOCOCCUS AUREUS  Blood 12/24 > Negative  Blood 1/2: neg Sputum 1/4: MSSA  Antimicrobials:  azithro 11/2 >11/3 Ceftriaxone 11/2  Fluconazole 11/3 Zosyn 11/3 >> 11/7 Vanc 11/10 off Cefepime 11/10 > 11/16 09/22/2020 vancomycin for MRSA PNA >> 12/2 xxxx Vanc 12/11> 12/17 Zosyn 12/11> 12/17 xxxx vanc 12/24 (staph resp cutlure) >> 12/28 Zosyn 12/24 - 12/27 Cefazolin 12/27 >>1/1 linezolid 1/2 eraxis 1/2  Meropenem 1/2 > 1/8  Subjective:  No acute  events overnight. Remains on low dose levophed 2 mcg.   Objective   Blood pressure (!) 105/50, pulse 95, temperature 99 F (37.2 C), temperature source Oral, resp. rate 19, height 5\' 8"  (1.727 m), weight 82.7 kg, SpO2 100 %.    Vent Mode:  PRVC FiO2 (%):  [40 %] 40 % Set Rate:  [28 bmp] 28 bmp Vt Set:  [450 mL] 450 mL PEEP:  [5 cmH20] 5 cmH20 Pressure Support:  [10 cmH20] 10 cmH20 Plateau Pressure:  [23 cmH20] 23 cmH20   Intake/Output Summary (Last 24 hours) at 11/08/2020 0913 Last data filed at 11/08/2020 0800 Gross per 24 hour  Intake 2140.85 ml  Output 515 ml  Net 1625.85 ml   Filed Weights   11/04/20 1355 11/04/20 1645 11/07/20 1045  Weight: 76.5 kg 76.5 kg 82.7 kg   Physical Exam  General: Chronically ill appearing female on vent via trach.  HEENT: trach secure. Anicteric sclera. Moist mucous membranes. Neuro: Opens eyes, nods head to yes no questions.  CV: RRR, no MRG. No sig edema.  PULM:  Rhonchi mostly clears with suctioning GI: Soft, non-tender, non-distended. Stoma pink.  Extremities: No acute deformity.  Skin: c/d/w. Sacral wound.   Resolved problems:  Previously hemorrhagic shock requiring PRBCs, Septic shock, MRSA pneumonia s/p 8 days abx Complicated by MRSA healthcare associated pneumonia (treated) Peritonitis and perforated ulcer post repair 09/01/20 MSSA HAP  Assessment:  Shanita Kanan is a 70 y.o. with history of COVID 19 infection requiring hospitalization in October 2021 with current admission for hemorrhagic shock secondary to perforated gastric ulcer requiring surgical intervention and [place] Field. Her medical issues are as follows:  Acute metabolic encephalopathy : Prolonged critical illness, sedation/pain - wean precedex as able - Clonazepam from 1mg  TID to 0.5mg  TID 1/8 - Continue dilaudid 1mg  q6hrs, fentanyl patch 85mcg - Has been on seroquel 50mg  BID.   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 Currently 10/5  Persistent vasoplegia vs circulatory shock.  - over 60 days -- big picture, long term pressors is not sustainable   - on max dose midodrine - continue pressors, titrate down as tolerated  Renal failure - Nephrology following - has had multiple attempts to transition from CRRT to HD. On HD on pressors as of 1/6, but have been unable to pull volume.  - Next HD 1/11 with plans to pull.    Afib/DVT HGB drop 1/4 to 6.9>> received 1 unit PRBC No obvious bleeding, suspect nosocomial losses with chronic disease - continue heparin drip per pharmacy - Monitor for bleeding/ CBC daily. No signs today  Critical Illness Myopathy Severe deconditioning  Inadequate PO intake  - PT following - EN per RDN  DM2 with hyperglycemia - Will continue lantus 15units BID - Will continue Q 4 coverage to 7 units  Stage IV sacral ulcer -WOC, hydrotherapy, dakins   History of stage IV colon cancer 1.9 cm cystic appearing lesion located in the pancreatic body  GOC -multiple [place] conversations with various PCCM providers and family have not yielded changes in scope of tx, multiple PCCM providers have been fired by family.  -concerns for futility of certain interventions -ethics consulted 12/30-- recs for time limited trial of CRRT, please see separate Ethics consult note for full details. Futility policy reviewed with family by ethics. Ongoing meetings with Dr. Hulen Skains with nursing administration and family. The ultimate goal  is to transition her safely to HD from CRRT which has proven difficult thus far.   Best practice:  Diet: EN  Pain/Anxiety/Delirium protocol (if indicated): Titrating precedex.   VAP protocol (if indicated): yes DVT prophylaxis: IV heparin GI prophylaxis: Protonix Glucose control: SSI, TF coverage, lantus Mobility: PT Family communication: updated 11/09/19 daughter at bedside  Critical care time 75 minutes  Georgann Housekeeper, AGACNP-BC [place] for personal pager PCCM on call pager [phone]  11/08/2020 9:15 AM

## 2020-11-09 DIAGNOSIS — I4891 Unspecified atrial fibrillation: Secondary | ICD-10-CM | POA: Diagnosis not present

## 2020-11-09 LAB — HEPARIN LEVEL (UNFRACTIONATED)
Heparin Unfractionated: 0.21 IU/mL — ABNORMAL LOW (ref 0.30–0.70)
Heparin Unfractionated: 0.36 IU/mL (ref 0.30–0.70)

## 2020-11-09 LAB — CBC
HCT: 23.7 % — ABNORMAL LOW (ref 36.0–46.0)
Hemoglobin: 7.3 g/dL — ABNORMAL LOW (ref 12.0–15.0)
MCH: 29.1 pg (ref 26.0–34.0)
MCHC: 30.8 g/dL (ref 30.0–36.0)
MCV: 94.4 fL (ref 80.0–100.0)
Platelets: 245 10*3/uL (ref 150–400)
RBC: 2.51 MIL/uL — ABNORMAL LOW (ref 3.87–5.11)
RDW: 17.7 % — ABNORMAL HIGH (ref 11.5–15.5)
WBC: 12.4 10*3/uL — ABNORMAL HIGH (ref 4.0–10.5)
nRBC: 0.2 % (ref 0.0–0.2)

## 2020-11-09 LAB — BASIC METABOLIC PANEL
Anion gap: 13 (ref 5–15)
Anion gap: 12 (ref 5–15)
BUN: 134 mg/dL — ABNORMAL HIGH (ref 8–23)
BUN: 66 mg/dL — ABNORMAL HIGH (ref 8–23)
CO2: 21 mmol/L — ABNORMAL LOW (ref 22–32)
CO2: 23 mmol/L (ref 22–32)
Calcium: 9.6 mg/dL (ref 8.9–10.3)
Calcium: 8.7 mg/dL — ABNORMAL LOW (ref 8.9–10.3)
Chloride: 101 mmol/L (ref 98–111)
Chloride: 99 mmol/L (ref 98–111)
Creatinine, Ser: 1.48 mg/dL — ABNORMAL HIGH (ref 0.44–1.00)
Creatinine, Ser: 0.87 mg/dL (ref 0.44–1.00)
GFR, Estimated: 38 mL/min — ABNORMAL LOW (ref >60)
GFR, Estimated: >60 mL/min (ref >60)
Glucose, Bld: 101 mg/dL — ABNORMAL HIGH (ref 70–99)
Glucose, Bld: 133 mg/dL — ABNORMAL HIGH (ref 70–99)
Potassium: 4.3 mmol/L (ref 3.5–5.1)
Potassium: 3.1 mmol/L — ABNORMAL LOW (ref 3.5–5.1)
Sodium: 135 mmol/L (ref 135–145)
Sodium: 134 mmol/L — ABNORMAL LOW (ref 135–145)

## 2020-11-09 LAB — GLUCOSE, CAPILLARY
Glucose-Capillary: 83 mg/dL (ref 70–99)
Glucose-Capillary: 100 mg/dL — ABNORMAL HIGH (ref 70–99)
Glucose-Capillary: 121 mg/dL — ABNORMAL HIGH (ref 70–99)
Glucose-Capillary: 120 mg/dL — ABNORMAL HIGH (ref 70–99)
Glucose-Capillary: 87 mg/dL (ref 70–99)
Glucose-Capillary: 125 mg/dL — ABNORMAL HIGH (ref 70–99)

## 2020-11-09 MED ORDER — INSULIN ASPART 100 UNIT/ML ~~LOC~~ SOLN
4.0000 [IU] | SUBCUTANEOUS | Status: DC
  Administered 2020-11-09 – 2020-11-18 (×29): 4 [IU] via SUBCUTANEOUS

## 2020-11-09 MED ORDER — ALBUMIN HUMAN 25 % IV SOLN
INTRAVENOUS | Status: AC
  Administered 2020-11-09: 12.5 g via INTRAVENOUS
  Filled 2020-11-09: qty 100

## 2020-11-09 MED ORDER — ALBUMIN HUMAN 25 % IV SOLN: 25.0000 g | Freq: Once | INTRAVENOUS | Status: DC

## 2020-11-09 MED ORDER — ALBUMIN HUMAN 25 % IV SOLN
INTRAVENOUS | Status: AC
  Administered 2020-11-09: 25 g via INTRAVENOUS
  Filled 2020-11-09: qty 100

## 2020-11-09 MED ORDER — DARBEPOETIN ALFA 100 MCG/0.5ML IJ SOSY
PREFILLED_SYRINGE | INTRAMUSCULAR | Status: AC
  Administered 2020-11-09: 100 ug via INTRAVENOUS
  Filled 2020-11-09: qty 0.5

## 2020-11-09 MED ORDER — DAKINS (1/4 STRENGTH) 0.125 % EX SOLN
Freq: Two times a day (BID) | CUTANEOUS | Status: AC
  Administered 2020-11-09: 1
  Filled 2020-11-09 (×3): qty 473

## 2020-11-09 MED ORDER — HEPARIN SODIUM (PORCINE) 1000 UNIT/ML IJ SOLN
INTRAMUSCULAR | Status: AC
  Administered 2020-11-09: 1000 [IU] via INTRAVENOUS_CENTRAL
  Filled 2020-11-09: qty 4

## 2020-11-09 NOTE — Progress Notes (Signed)
Physical Therapy Treatment/ Discharge Patient Details Name: Candace [name] MRN: [id number] DOB: [birth year] Today's Date: 11/09/2020    History of Present Illness 70 y.o. female with medical history significant for recent covid pna, obesity, colon cancer s/p colon resection with colostomy bag, HLD, NIDDM2, CKD3 presented to ED after her follow-up nurse advised her to present to ED for elevated HR. +afib, elevated troponins with demand ischemia,  CT scan is positive for bowel perforation with pneumomediastinum 11/03 exp lap with repair of perforated ulcer. 11/10 t/f to ICU- intubated/sedated/proned. Trach placed 11/24. CRRT off 12/8, restarted 12/10-1/5    PT Comments    Daughter Candace [name] present throughout session today with education and demonstration performed for PROM all joints. P.T. demonstrated on right side while daughter performed on left side with pt able to tolerate PROM with only trace activation of fingers and right wrist, no other trace activation today. Daughter educated for chair positioning for attempt at trunk engagement, neck stretch in addition to all PROM of joints. Candace [name] demonstrated and expressed understanding and states she feels comfortable to educate and engage other family/visitors. No further acute therapy needs at this time with family aware and agreeable, will sign off.    Follow Up Recommendations  No PT follow up     Equipment Recommendations  Wheelchair (measurements PT);Wheelchair cushion (measurements PT);Hospital bed;Other (comment)    Recommendations for Other Services       Precautions / Restrictions Precautions Precautions: Fall Precaution Comments: baseline colostomy; trach-vent; sacral wound    Mobility  Bed Mobility               General bed mobility comments: transitioned to sitting position with total assist to lift trunk from surface x 2  Transfers                    Ambulation/Gait                 Stairs              Wheelchair Mobility    Modified Rankin (Stroke Patients Only)       Balance                                            Cognition Arousal/Alertness: Lethargic Behavior During Therapy: Flat affect Overall Cognitive Status: Difficult to assess                                 General Comments: pt lethargic with limited eye opening and not responding to questions or commands for movement or telling therapist who visitor in room is. Pt end of session stating she was ok but that was the only real interraction during session      Exercises General Exercises - Upper Extremity Shoulder Flexion: PROM;Both;10 reps;Supine Shoulder ADduction: PROM;Both;Supine;10 reps Elbow Flexion: Both;PROM;10 reps;Supine Elbow Extension: Both;PROM;10 reps;Supine Wrist Flexion: PROM;Both;Supine;10 reps Wrist Extension: PROM;Both;10 reps;Supine Digit Composite Flexion: Both;10 reps;PROM;Supine Composite Extension: PROM;Both;Supine;10 reps General Exercises - Lower Extremity Ankle Circles/Pumps: PROM;Both;10 reps;Supine Heel Slides: PROM;Both;10 reps;Supine Hip ABduction/ADduction: PROM;10 reps;Both;Supine Other Exercises Other Exercises: hip internal/external rotation x 10 PRoM    General Comments        Pertinent Vitals/Pain Pain Assessment: Faces Pain Score: 3  Pain Location: grimace with right elbow flexion Pain Descriptors / Indicators: Grimacing Pain  Intervention(s): Limited activity within patient's tolerance;Repositioned    Home Living                      Prior Function            PT Goals (current goals can now be found in the care plan section) Progress towards PT goals: Goals met/education completed, patient discharged from PT    Frequency           PT Plan Current plan remains appropriate    Co-evaluation              AM-PAC PT "6 Clicks" Mobility   Outcome Measure  Help needed turning from your back to your  side while in a flat bed without using bedrails?: Total Help needed moving from lying on your back to sitting on the side of a flat bed without using bedrails?: Total Help needed moving to and from a bed to a chair (including a wheelchair)?: Total Help needed standing up from a chair using your arms (e.g., wheelchair or bedside chair)?: Total Help needed to walk in hospital room?: Total Help needed climbing 3-5 steps with a railing? : Total 6 Click Score: 6    End of Session   Activity Tolerance: Patient tolerated treatment well Patient left: in bed;with family/visitor present;with call bell/phone within reach Nurse Communication: Mobility status;Need for lift equipment PT Visit Diagnosis: Muscle weakness (generalized) (M62.81);Difficulty in walking, not elsewhere classified (R26.2);Pain;Adult, failure to thrive (R62.7);Unsteadiness on feet (R26.81)     Time: 6808-8110 PT Time Calculation (min) (ACUTE ONLY): 23 min  Charges:  $Therapeutic Exercise: 23-37 mins                     Zebulen Simonis P, PT Acute Rehabilitation Services Pager: [phone] Office: [phone]    Darey Hershberger B Alif Petrak 11/09/2020, 12:30 PM

## 2020-11-09 NOTE — Consult Note (Signed)
[place] Nurse wound follow up Wound type:Unstageable Pressure Injury Measurement: see PT notes from hydrotherapy today Wound bed:100% necrotic; with skin necrosis circumfrencially that extends further along the distal aspect of the wound bed Drainage (amount, consistency, odor) serosanguinous  Periwound: intact some bloody oozing  Dressing procedure/placement/frequency: DC hydrotherapy for now; no real improvement with treatment 1/4% Dakins BID; reassess in 7-10 days Notified primary team that wide debridement needed if chances of healing at all.   [place] Nurse team will follow with you and see patient within 10 days for wound assessments.  Please notify [place] nurses of any acute changes in the wounds or any new areas of concern [place] MSN, [place], [place], [place]

## 2020-11-09 NOTE — Progress Notes (Signed)
Physical Therapy Wound Treatment and Discharge Patient Details  Name: Candace [name] MRN: [id number] Date of Birth: [birth year]  Today's Date: 11/09/2020 Time: 1310-1340 Time Calculation (min): 30 min  Subjective  Subjective: Alert on vent Patient and Family Stated Goals: Heal wound; maintain pt comfort per daughter Date of Onset:  (unknown)  Pain Score:  Premedicated  Wound Assessment  Pressure Injury 09/25/20 Sacrum Bilateral;Medial Deep Tissue Pressure Injury - Purple or maroon localized area of discolored intact skin or blood-filled blister due to damage of underlying soft tissue from pressure and/or shear. Purple, non-blanchable, b (Active)  Dressing Type ABD;Barrier Film (skin prep);Gauze (Dakins);Moist to moist 11/09/20 1502  Dressing Changed;Clean;Dry;Intact 11/09/20 1502  Dressing Change Frequency Twice a day 11/09/20 1502  State of Healing Eschar 11/09/20 1502  Site / Wound Assessment Yellow;Pink;Black 11/09/20 1502  % Wound base Red or Granulating 0% 11/09/20 1502  % Wound base Yellow/Fibrinous Exudate 90% 11/09/20 1502  % Wound base Black/Eschar 10% 11/09/20 1502  % Wound base Other/Granulation Tissue (Comment) 0% 11/09/20 1502  Peri-wound Assessment Excoriated 11/09/20 1502  Wound Length (cm) 16 cm 11/05/20 1019  Wound Width (cm) 11 cm 11/05/20 1019  Wound Depth (cm) 5 cm 11/05/20 1019  Wound Surface Area (cm^2) 176 cm^2 11/05/20 1019  Wound Volume (cm^3) 880 cm^3 11/05/20 1019  Tunneling (cm) 0 10/13/20 1200  Undermining (cm) 0 10/13/20 1200  Margins Unattached edges (unapproximated) 11/09/20 1502  Drainage Amount Minimal 11/09/20 1502  Drainage Description Serosanguineous 11/09/20 1502  Treatment Hydrotherapy (Pulse lavage);Packing (Saline gauze) 11/09/20 1502     Hydrotherapy Pulsed lavage therapy - wound location: sacrum Pulsed Lavage with Suction (psi): 12 psi Pulsed Lavage with Suction - Normal Saline Used: 1000 mL Pulsed Lavage Tip: Tip with splash shield    Wound Assessment and Plan  Wound Therapy - Assess/Plan/Recommendations Wound Therapy - Clinical Statement: Melody, WOC present at beginning of session. Due to slow removal of necrotic adipose, d/c'ed hydrotherapy currently. Plan for BID Dakin's and reassessment. Wound Therapy - Functional Problem List: Global weakness and immobility Factors Delaying/Impairing Wound Healing: Diabetes Mellitus;Immobility;Multiple medical problems Hydrotherapy Plan: Debridement;Dressing change;Patient/family education;Pulsatile lavage with suction Wound Therapy - Frequency: 6X / week Wound Therapy - Follow Up Recommendations: Skilled nursing facility Wound Plan: See above  Wound Therapy Goals- Improve the function of patient's integumentary system by progressing the wound(s) through the phases of wound healing (inflammation - proliferation - remodeling) by: Decrease Necrotic Tissue to: 60 Decrease Necrotic Tissue - Progress: Discontinued (comment) Increase Granulation Tissue to: 40 Increase Granulation Tissue - Progress: Discontinued (comment) Goals/treatment plan/discharge plan were made with and agreed upon by patient/family: No, Patient unable to participate in goals/treatment/discharge plan and family unavailable Time For Goal Achievement: 7 days Wound Therapy - Potential for Goals: Fair  Goals will be updated until maximal potential achieved or discharge criteria met.  Discharge criteria: when goals achieved, discharge from hospital, MD decision/surgical intervention, no progress towards goals, refusal/missing three consecutive treatments without notification or medical reason.  GP    [name], PT, DPT Acute Rehabilitation Services Pager [phone] Office [phone]   [name] 11/09/2020, 3:05 PM

## 2020-11-09 NOTE — Progress Notes (Signed)
ANTICOAGULATION CONSULT NOTE  Pharmacy Consult:  Heparin Indication: atrial fibrillation   Labs: Recent Labs    11/07/20 0442 11/07/20 0443 11/07/20 1645 11/08/20 0500 11/08/20 1637 11/09/20 0449  HGB  --  7.3*  --  7.4*  --  7.3*  HCT  --  23.2*  --  24.8*  --  23.7*  PLT  --  228  --  233  --  245  HEPARINUNFRC 0.48  --   --  0.29*  --  0.21*  CREATININE  --  1.78*   < > 1.21* 1.33* 1.48*   < > = values in this interval not displayed.    Assessment: 70 yo female with history of Afib on apixaban prior to admission, which has been held and patient transitioned to IV heparin.   Heparin level of 0.21. Hgb 7.4. Per RN no issues with infusion or access and no bleeding noted.   Goal of Therapy:  Heparin level 0.3-0.5 units/ml   Plan:  Increase heparin at 1450 units/hr Check 6 hr heparin level Monitor daily heparin level and CBC Thanks for allowing pharmacy to be a part of this patient's care.  Excell Seltzer, PharmD Clinical Pharmacist 11/09/2020, 5:42 AM

## 2020-11-09 NOTE — Progress Notes (Signed)
ANTICOAGULATION CONSULT NOTE  Pharmacy Consult:  Heparin Indication: atrial fibrillation   Labs: Recent Labs    11/07/20 0443 11/07/20 1645 11/08/20 0500 11/08/20 1637 11/09/20 0449 11/09/20 1200  HGB 7.3*  --  7.4*  --  7.3*  --   HCT 23.2*  --  24.8*  --  23.7*  --   PLT 228  --  233  --  245  --   HEPARINUNFRC  --   --  0.29*  --  0.21* 0.36  CREATININE 1.78*   < > 1.21* 1.33* 1.48*  --    < > = values in this interval not displayed.    Assessment: 70 yo female with history of Afib on apixaban prior to admission, which has been held and patient transitioned to IV heparin.   Hep lvl now back within goal Cbc stable  Goal of Therapy:  Heparin level 0.3-0.5 units/ml   Plan:  Continue heparin at 1450 units/hr Monitor daily heparin level and CBC  Barth Kirks, PharmD, BCPS, BCCCP Clinical Pharmacist [phone]  Please check AMION for all [place] numbers  11/09/2020 1:46 PM

## 2020-11-09 NOTE — Progress Notes (Signed)
Nutrition Follow-up  DOCUMENTATION CODES:   Obesity unspecified  INTERVENTION:   Tube Feeding via Cortrak:  Vital 1.5 at 31ml/hr, increase by 10 ml every 4 hours to goal rate of 65 ml/h (1560 ml) Pro-source TF 90 mL TID Provides 171g of protein, 2580kcals and 1094 mL of free water  Continue Juven BID, each packet provides 80 calories, 8 grams of carbohydrate, 2.5  grams of protein (collagen), 7 grams of L-arginine and 7 grams of L-glutamine; supplement contains CaHMB, Vitamins C, E, B12 and Zinc to promote wound healing  Continue B-complex with C   NUTRITION DIAGNOSIS:   Inadequate oral intake related to inability to eat as evidenced by NPO status.  Being addressed via TF   GOAL:   Patient will meet greater than or equal to 90% of their needs  Progressing  MONITOR:   Vent status,TF tolerance,Labs,Weight trends  REASON FOR ASSESSMENT:   Consult New TPN/TNA  ASSESSMENT:   71 y.o. female was recently hospitalized for COVID-19 pneumonia-and discharged on 2-3 L of home O2-presents with worsening shortness of breath-found to have A. fib with RVR, worsening hypoxia. Pt developed severe upper abdominal pain-subsequent further imaging studies revealed perforated viscus. S/p laparotomy on 11/3 which showed a perforated duodenal ulcer.   Discussed patient in ICU rounds and with RN today. Patient has transitioned to intermittent HD.  Received HD this morning. UF 500 ml. Following commands, but is very weak. Levophed is currently off. TF is currently on hold due to vomiting yesterday.  Plans to resume trickle TF, advancing to goal rate slowly. If patient still does not tolerate feeding, can attempt to re-position Cortrak tube on Wednesday.  Abdominal film taken 1/10 showed tip of tube in the region of the pylorus.   Pt remains on vent support via trach.  MAP (cuff) >/= 67 this morning MV: 12.4 L/min Temp (24hrs), Avg:98.4 F (36.9 C), Min:97.4 F (36.3 C), Max:99.3 F  (37.4 C)   Previous TF regimen: Vital 1.5 at 65 ml/hr with Prosource TF 90 ml TID via Cortrak tube. Juven BID via tube to support wound healing.  Receiving hydrotherapy for DTI sacral wound.   Ileostomy with 250 mL out x 24 hours. I/O + 12 L since admission  Patient received Copper supplement for low level of 20 on 12/16.  Receiving Vit D supplementation for low level 18.37 on 12/16.  Labs: BUN 134, creat 1.48 CBGs: 83-100  Medications reviewed and include Vitamin D3, B complex with C, aranesp, colace, Miralax, novolog, lantus, precedex.   Diet Order:   Diet Order            Diet NPO time specified  Diet effective midnight                 EDUCATION NEEDS:   Not appropriate for education at this time  Skin:  Skin Assessment: Skin Integrity Issues: Skin Integrity Issues:: Stage II DTI: sacrum Stage I: n/a Stage II: ear Incisions: abdomen  Last BM:  1/11 type 7, type 5  Height:   Ht Readings from Last 1 Encounters:  09/20/20 5\' 8"  (1.727 m)    Weight:   Wt Readings from Last 1 Encounters:  11/09/20 86.5 kg   Admission weight 99.7 kg (11/2)  BMI:  Body mass index is 29 kg/m.  Estimated Nutritional Needs:   Kcal:  3419-6222 kcals  Protein:  135-175 g  Fluid:  >/= 2 L/day   Lucas Mallow, RD, LDN, CNSC Please refer to Amion for contact information.

## 2020-11-09 NOTE — Progress Notes (Signed)
NAMELillyan [name], MRN:  [id number], DOB:  07-18-1951, LOS: 64 ADMISSION DATE:  08/31/2020, CONSULTATION DATE:  09/07/2020 REFERRING MD:  Dr Candiss Norse, CHIEF COMPLAINT:  Acute resp failure  Brief History   70 year old female who was previously diagnosed with Covid 08/23/2020.  Admitted 11/2 with AF-RVR, found to have a perforated duodenal underwent exploratory laparotomy with Phillip Heal patch placement 11/3.  Past Medical History  Covid pneumonia Atrial fibrillation CKD stage III Diabetes mellitus Hypertension Colon cancer Hyperlipidemia  Significant Hospital Events   11/2 Admitted  11/3 OR with findings of perforated duodenal ulcer 11/10 progressive hemorrhagic shock, intubated, transfused, pressors, proned; started on CRRT in PM 11/03 Exploratory laparotomy, Phillip Heal patch, lysis of adhesion for duodenal ulceration postop day 6 11/16 Extubated. Re-intubated overnight due to respiratory distress and hypoxia with decreased mentation 11/18 Bronch, cultures sent 11/19 Hgb down getting blood 11/24 Spiked fever resume empirical antimicrobial therapy 11/26 Hemorrhagic shock, hgb 5.6, increased pressors, CT A/P  11/30 Per palliative "[name] expresses understanding that patient is unfortunately very fragile despite ongoing intensive medical treatment and full mechanical support. She indicates that the family wants to continue with all current interventions despite potential outcomes". 12/08 CRRT discontinued due to clotting 12/09 Family requested transfer to tertiary care [facility]). [facility] denied transfer  12/10 CRRT restarted. Episodes of tachycardia, tachypnea that seem to improve with pain management 12/11 Back in shock. Pressor requirements up. CXR worse. ABX resumed 12/12 Still requiring inc pressors. Had hypoglycemic event. Basal insulin dosing adjusted 12/13 Pressor requirements better. Now hyperglycemic. Re-adjusted Glycemic control  12/14 Changed dilaudid to1/2 dosing from day further. D/c  vasopressin.  Goals of care reconfirmed with daughter.  Patient continues to desire aggressive care.  Not open to discussing any other option, patient family continues to be hopeful that she will be discharged to home with full recovery in spite of multiple attempts by staff to prepare them that this is unlikely scenario 12/15 Dilaudid discontinued sending cortisol for ongoing pressor dependence 12/16 Serum cortisol <20, added stress dose steroids.  PRN Dilaudid, attempting not go back on Dilaudid infusion 12/17 Developed worsening tachycardia during the evening hours received initially IV albumin, followed by resuming IV Dilaudid with question of suboptimal pain control.  Currently looks better back on Dilaudid drip.  We have been able to wean pressors after adding stress dose steroids; near arrest - bradycardia, better with atropine  12/19 Afebrile . Remains on dilaudid and heparin gtt, dilaudid gtt increased overnight for concern of pain / ongoing tachycardia, no other events . NE and precedex off 12/17. Ongoing CRRT- even UF, dosing lokelmia this morning 12/20 On CRRT.  Renal plans for HD cath and moving to HD. Getting wound care  12/23 -No vent weaning per RT, remains on full support, #8 trach in place but previously had #6, no documentation of change noted in chart Afebrile / WBC 20.4  Vent - 30% FiO2, PEEP 5 Glucose range 176-212 I/O 465 ml stool, 3.6L removed with HD, -1.1L in last 24 hours  RN reports ongoing periods of tachypnea / vent dyssynchrony that responds to dilaudid   12/24 - renal stopping CRRT today and plans HD 10/24/20 . 40% fio2 on vent via Trach. TAchypenic and tachycardic. Afebrile but wbc up to 27.6K. On TF. Onn heparin gtt  12/25 - Back on CRRT. On vent via trach at 40% fio2, On scheduled dilaudid as add on to oxy. Per family request 12/24 - they felt scheduled oxy was not adequate and patient was showing  signs of opioid withdrawal.  Patient also had  worsening SIRS/sepsis  syndrome. Had fever, rising wbc, worsening RR 40-60 and HR 140s sinus-> started On abx yesterday. Fever some better today. WBC plateau at 28,.5K. On new levophed -> signifanct improvement in HR 77 and RR t0 20. On heparin gtt.  On precedex gtt. On levophed gtt 68mcg wthi midodrine. On TF 12/30 Remains on CRRT with intermittent pressor requirements. Ethics consult placed evening of 12/29. Ethics rec time trial of CRRT 12/31 failed SBT with RR 40s. Several conversations between care tam and family, who are upset RE plan of care 1/1 back on pressors  1/4: On pressors, keeping even on CVVHD, HGB drop to 6.9, transfused 1 unit. Improving mental status 1/10 remains on low dose levophed 40mcg 1/11 off levo, attempting HD with UF for first time.   Consults:  Cardiology [facility] Nephrology  Ethics  Procedures:  R PICC 11/5 >> A line 11/9 >> out ETT 11/9 > 11/16, 11/16 >> 09/21/2020, 09/21/2020 tracheostomy>> Lt Ebro CVL 11/9 >> R IJ trialysis >> out HD catheter 12/1 >>12/20 12/21 - 14.5 Fr, 23 cm right IJ tunneled hemodialysis catheter placement.  Removal of indwelling subclavian catheter.   Significant Diagnostic Tests:  11/3 CT abd/ pelvis > 1. Positive for bowel perforation: Pneumoperitoneum and intermediate density free fluid in the abdomen. Prior total colectomy. The specific site of perforation is unclear-oral contrast present to the proximal jejunum has not obviously leaked. Note that there may be small bowel loops adherent to the ventral abdominal wall along the greater curve of the stomach. 2. Extensive bilateral lower lung pneumonia. No pleural effusion. 3. Other abdominal and pelvic viscera are stable since 2015, including bilateral adrenal adenomas. Chronic renal parapelvic cysts. 4. Aortic Atherosclerosis 11/3 TTE > EF 70-75%, RV not well visualized, mildly reduced RV systolic function 32/20 CT chest/ abd/ pelvis> 1. Interval progression of diffuse bilateral hazy ground-glass airspace  opacities with more focal areas of consolidation at the lung bases 2. Trace bilateral pleural effusions. 3. Postsurgical changes the abdomen as detailed above. No evidence for a postoperative abscess, however evaluation is limited by lack of IV contrast. 4. There is a 1.9 cm cystic appearing lesion located in the pancreatic body. This was not present on the patient's CT from 2015.  Follow-up with an outpatient contrast enhanced MRI is recommended. 5. The endometrial stripe appears diffusely thickened. Follow-up with pelvic ultrasound is recommended. Aortic Atherosclerosis 11/14 LE doppler studies > + DVT of right posterior tibial and peroneal vein, +dVT of left posterior tibial vein  11/26 CT ABD PEL > liver unremarkable, distended gallbladder with layering tiny gallstones versus sludge, no duct dilatation, mild hyperdensity right upper pole renal collecting system new.  No evidence of retroperitoneal bleeding.  Multifocal lower lobe predominant pulmonary infiltrates/pneumonia.  Small left pleural effusion.  Micro Data:  11/10 MRSA PCR > neg 11/10 BC x 2 > neg 09/22/2020 blood cultures x2>> S epi.  09/22/2020 sputum culture>> MRSA Blood 12/1 >> negative. S.epi 1 out of 2, likely contaminant BCx 2 12/12 >> neg xxx Trach aspirate 12/24 - FEW STAPHYLOCOCCUS AUREUS  Blood 12/24 > Negative  Blood 1/2: neg Sputum 1/4: MSSA  Antimicrobials:  azithro 11/2 >11/3 Ceftriaxone 11/2  Fluconazole 11/3 Zosyn 11/3 >> 11/7 Vanc 11/10 off Cefepime 11/10 > 11/16 09/22/2020 vancomycin for MRSA PNA >> 12/2 xxxx Vanc 12/11> 12/17 Zosyn 12/11> 12/17 xxxx vanc 12/24 (staph resp cutlure) >> 12/28 Zosyn 12/24 - 12/27 Cefazolin 12/27 >>1/1 linezolid 1/2 eraxis  1/2  Meropenem 1/2 > 1/8  Subjective:  Off levophed. Currently on HD with UF and BP falling slightly with systolics in the 67Y.  Some vomiting of TF 1/10 with TF held overnight. KUB negative.   Objective   Blood pressure (!) 88/59, pulse 75,  temperature 97.7 F (36.5 C), temperature source Axillary, resp. rate (!) 28, height 5\' 8"  (1.727 m), weight 87 kg, SpO2 100 %.    Vent Mode: PRVC FiO2 (%):  [30 %-40 %] 30 % Set Rate:  [28 bmp] 28 bmp Vt Set:  [450 mL] 450 mL PEEP:  [5 cmH20] 5 cmH20 Pressure Support:  [10 cmH20] 10 cmH20 Plateau Pressure:  [17 cmH20-26 cmH20] 24 cmH20   Intake/Output Summary (Last 24 hours) at 11/09/2020 0941 Last data filed at 11/09/2020 0800 Gross per 24 hour  Intake 1432.9 ml  Output 200 ml  Net 1232.9 ml   Filed Weights   11/04/20 1645 11/07/20 1045 11/09/20 0645  Weight: 76.5 kg 82.7 kg 87 kg   Physical Exam  General: Chronically ill appearing adult female on vent.   HEENT: Trach in place. [place]/AT Neuro: Opens eyes, nods head to yes no questions.  CV: RRR, no MRG no significant edema.   PULM:  Rhonchi mostly clears with suctioning GI: Soft, non-tender, non-distended. Stoma pink.  Extremities: No acute deformity.  Skin: c/d/w. Sacral wound.   Resolved problems:  Previously hemorrhagic shock requiring PRBCs, Septic shock, MRSA pneumonia s/p 8 days abx Complicated by MRSA healthcare associated pneumonia (treated) Peritonitis and perforated ulcer post repair 09/01/20 MSSA HAP  Assessment:  Candace [name] is a 69 y.o. with history of COVID 19 infection requiring hospitalization in October 2021 with current admission for hemorrhagic shock secondary to perforated gastric ulcer requiring surgical intervention and [place]. Her medical issues are as follows:  Acute metabolic encephalopathy : Prolonged critical illness, sedation/pain - wean precedex as able. Plans to wean at 0.2 mg per day until off.  - Clonazepam 0.5mg  TID 1/8 - Continue dilaudid 1mg  q6hrs, fentanyl patch 60mcg - Has been on seroquel 50mg  BID.  - Will attempt to wean further once precedex off if able.   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 - PSV trials ongoing. Hopefully can progress to trach collar.   Persistent vasoplegia vs circulatory shock.  - over 60 days -- big picture, long term pressors is not sustainable  - on max dose midodrine  Renal failure - Nephrology following - has had multiple attempts to transition from CRRT to HD. On HD on pressors as of 1/6, but have been unable to pull volume.  - HD today with UF, SBP dipped into 80s, but so far pressors have not been restarted.   Afib/DVT HGB drop 1/4 to 6.9>> received 1 unit PRBC No obvious bleeding, suspect nosocomial losses with chronic disease - continue heparin drip per pharmacy - Monitor for bleeding/ CBC daily. No signs today  Critical Illness Myopathy Severe deconditioning  Inadequate PO intake  - PT following - EN per RDN  DM2 with hyperglycemia - Will continue lantus 15units BID - Will continue Q 4 coverage to 7 units  Stage IV sacral ulcer -WOC, hydrotherapy, dakins   History of stage IV colon cancer 1.9 cm cystic appearing lesion located in the pancreatic body  GOC -multiple GOC conversations with various PCCM providers and family have not yielded changes in scope of tx,  multiple PCCM providers have been fired by family.  -concerns for futility of certain interventions -ethics consulted 12/30-- recs for time limited trial of CRRT, please see separate Ethics consult note for full details. Futility policy reviewed with family by ethics. Ongoing meetings with Dr. Hulen Skains with nursing administration and family. The ultimate goal is to transition her safely to HD from CRRT which has proven difficult thus far.   Best practice:  Diet: EN  Pain/Anxiety/Delirium protocol (if indicated): Titrating precedex.   VAP protocol (if indicated): yes DVT prophylaxis: IV heparin GI prophylaxis: Protonix Glucose control: SSI, TF coverage, lantus Mobility: PT Family communication:  updated 11/09/19 daughter at bedside  Critical care time 89 minutes  Georgann Housekeeper, AGACNP-BC [place] for personal pager PCCM on call pager [phone]  11/09/2020 9:41 AM

## 2020-11-09 NOTE — Progress Notes (Signed)
[facility] Progress Note   Assessment/ Plan:   # AKI- oliguric now likely anuric. ATN for shock.Started on CRRT 09/08/20 due to persistent oliguria/anuria and hyperkalemia.Lamar placed on 10/19/20. -Patient has previously failed multiple transitions to HD w/ low Bps requiring pressors -No signs of meaningful recovery from our team -Family expressed ongoing wishes for aggressive care -Ethics seen 12/30 and suggested time trial of 5-7 days CV while finishing treatment for PNA - CRRT stopped 11/03/20 at 1700 -HD today, likely will not be able to UF. UF profile + albumin x 2+ midodrine -moving forward, if she does not tolerate any ultrafiltration then renal replacement therapy in general (including CRRT) will be futile. Would recommend against re-initiation of CRRT at that point given futility and would need to discuss with the family what the next steps will be (which would be hospice at that point)  #Hyperkalemia: 2/2 renal failure.  Status post Lokelma with improvement.  Dialysis as above.  Could consider lokelma in between treatments if hyperK does develop  # h/o hemorrhagic shock/ ABLA- ongoing blood lossTransfuse as needed, have added ESA.    # Septic shock: still requiring low-dose pressors, over max dose of midodrine, on meropenem empirically.  Mgmt per primary  # Perforated duodenal ulcer- s/p exploratory lap and Graham patch placement per surgery.  # Acute hypoxic/hypercapnic respiratory failure due to ARDS from Recent Covid-19 PNA- currently on vent via trach per PCCM. Weaning efforts have been slow progress.  # Atrial fibrillation- on hep gtt  # Severe protein malnutrition- per primary svc.   #Hypercalcemia: immobility and CKD contributing, PTH 200 and Vit D normal. Calcium okay today. Could consider Sensipar if continued issue.   # Disposition- poor overall prognosis.    Subjective:    Seen on HD this AM. No acute events. Levo is off, SBP in the 90's.  Rec'd midodrine already. UFG 600cc   Objective:   BP 95/62   Pulse 82   Temp 97.7 F (36.5 C) (Axillary)   Resp (!) 26   Ht 5' 8"  (1.727 m)   Wt 87 kg   SpO2 100%   BMI 29.16 kg/m   Physical Exam: Gen: lying in bed, nad HEENT: trach in place CVS: RRR Resp: unlabored Abd: soft, nontender Ext: edema bilateral UE's ACCESS: R IJ TDC  Labs: BMET Recent Labs  Lab 11/02/20 1543 11/03/20 0518 11/04/20 7619 11/04/20 1843 11/05/20 0350 11/06/20 0556 11/06/20 1716 11/07/20 0443 11/07/20 1645 11/08/20 0500 11/08/20 1637 11/09/20 0449  NA 134* 131* 135 134*   < > 136 138 137 135 135 135 135  K 4.3 4.6 6.1* 3.7   < > 6.1* 6.5* 5.6* 3.6 4.0 4.2 4.3  CL 101 100 104 99   < > 102 104 103 100 99 100 101  CO2 23 24 21* 24   < > 17* 19* 17* 24 21* 21* 21*  GLUCOSE 249* 245* 118* 117*   < > 238* 205* 198* 151* 168* 70 101*  BUN 45* 43* 90* 57*   < > 166* 189* 213* 101* 121* 135* 134*  CREATININE 0.55 0.51 0.93 0.64   < > 1.46* 1.66* 1.78* 0.99 1.21* 1.33* 1.48*  CALCIUM 10.4* 10.9* 11.5* 9.8   < > 9.5 9.4 9.2 8.1* 8.9 9.2 9.6  PHOS 3.4 3.7 5.8* 3.5  --   --   --   --   --   --   --   --    < > = values  in this interval not displayed.   CBC Recent Labs  Lab 11/06/20 0556 11/07/20 0443 11/08/20 0500 11/09/20 0449  WBC 11.0* 12.3* 11.4* 12.4*  HGB 8.2* 7.3* 7.4* 7.3*  HCT 27.0* 23.2* 24.8* 23.7*  MCV 94.7 93.9 96.5 94.4  PLT 196 228 233 245      Medications:    . B-complex with vitamin C  1 tablet Per Tube Daily  . chlorhexidine gluconate (MEDLINE KIT)  15 mL Mouth Rinse BID  . Chlorhexidine Gluconate Cloth  6 each Topical Daily  . Chlorhexidine Gluconate Cloth  6 each Topical Q0600  . cholecalciferol  2,000 Units Per Tube Daily  . clonazepam  0.5 mg Per Tube TID  . darbepoetin (ARANESP) injection - DIALYSIS  100 mcg Intravenous Q Tue-HD  . docusate  100 mg Per Tube BID  . feeding supplement (PROSource TF)  90 mL Per Tube TID  . fentaNYL  1 patch Transdermal Q72H   . guaiFENesin  15 mL Per Tube Q12H  . heparin sodium (porcine)      .  HYDROmorphone (DILAUDID) injection  1 mg Intravenous Q6H  . influenza vaccine adjuvanted  0.5 mL Intramuscular Tomorrow-1000  . insulin aspart  0-15 Units Subcutaneous Q4H  . insulin aspart  7 Units Subcutaneous Q4H  . insulin glargine  15 Units Subcutaneous BID  . mouth rinse  15 mL Mouth Rinse 10 times per day  . midodrine  40 mg Per Tube TID WC  . nutrition supplement (JUVEN)  1 packet Per Tube BID BM  . pantoprazole sodium  40 mg Per Tube BID  . polyethylene glycol  17 g Per Tube Daily  . QUEtiapine  100 mg Per Tube QHS  . QUEtiapine  50 mg Per Tube Daily  . sodium chloride flush  10-40 mL Intracatheter Q12H    Shelma Eiben  11/09/2020, 9:24 AM

## 2020-11-09 NOTE — Progress Notes (Signed)
Hemodialysis completed. Able to UF 0.5L with addition of midodrine/50g albumin/uf profile 4. Reported off to ICU RN

## 2020-11-10 DIAGNOSIS — I4891 Unspecified atrial fibrillation: Secondary | ICD-10-CM | POA: Diagnosis not present

## 2020-11-10 LAB — BASIC METABOLIC PANEL
Anion gap: 15 (ref 5–15)
Anion gap: 12 (ref 5–15)
BUN: 79 mg/dL — ABNORMAL HIGH (ref 8–23)
BUN: 104 mg/dL — ABNORMAL HIGH (ref 8–23)
CO2: 21 mmol/L — ABNORMAL LOW (ref 22–32)
CO2: 21 mmol/L — ABNORMAL LOW (ref 22–32)
Calcium: 8.7 mg/dL — ABNORMAL LOW (ref 8.9–10.3)
Calcium: 8.7 mg/dL — ABNORMAL LOW (ref 8.9–10.3)
Chloride: 97 mmol/L — ABNORMAL LOW (ref 98–111)
Chloride: 100 mmol/L (ref 98–111)
Creatinine, Ser: 1.16 mg/dL — ABNORMAL HIGH (ref 0.44–1.00)
Creatinine, Ser: 1.27 mg/dL — ABNORMAL HIGH (ref 0.44–1.00)
GFR, Estimated: 51 mL/min — ABNORMAL LOW (ref >60)
GFR, Estimated: 46 mL/min — ABNORMAL LOW (ref >60)
Glucose, Bld: 160 mg/dL — ABNORMAL HIGH (ref 70–99)
Glucose, Bld: 204 mg/dL — ABNORMAL HIGH (ref 70–99)
Potassium: 3.4 mmol/L — ABNORMAL LOW (ref 3.5–5.1)
Potassium: 3.7 mmol/L (ref 3.5–5.1)
Sodium: 133 mmol/L — ABNORMAL LOW (ref 135–145)
Sodium: 133 mmol/L — ABNORMAL LOW (ref 135–145)

## 2020-11-10 LAB — CBC
HCT: 22.5 % — ABNORMAL LOW (ref 36.0–46.0)
Hemoglobin: 7 g/dL — ABNORMAL LOW (ref 12.0–15.0)
MCH: 29.2 pg (ref 26.0–34.0)
MCHC: 31.1 g/dL (ref 30.0–36.0)
MCV: 93.8 fL (ref 80.0–100.0)
Platelets: 256 10*3/uL (ref 150–400)
RBC: 2.4 MIL/uL — ABNORMAL LOW (ref 3.87–5.11)
RDW: 17.9 % — ABNORMAL HIGH (ref 11.5–15.5)
WBC: 10.3 10*3/uL (ref 4.0–10.5)
nRBC: 0.4 % — ABNORMAL HIGH (ref 0.0–0.2)

## 2020-11-10 LAB — GLUCOSE, CAPILLARY
Glucose-Capillary: 118 mg/dL — ABNORMAL HIGH (ref 70–99)
Glucose-Capillary: 180 mg/dL — ABNORMAL HIGH (ref 70–99)
Glucose-Capillary: 198 mg/dL — ABNORMAL HIGH (ref 70–99)
Glucose-Capillary: 188 mg/dL — ABNORMAL HIGH (ref 70–99)
Glucose-Capillary: 189 mg/dL — ABNORMAL HIGH (ref 70–99)
Glucose-Capillary: 195 mg/dL — ABNORMAL HIGH (ref 70–99)

## 2020-11-10 LAB — HEPARIN LEVEL (UNFRACTIONATED): Heparin Unfractionated: 0.27 IU/mL — ABNORMAL LOW (ref 0.30–0.70)

## 2020-11-10 MED ORDER — APIXABAN 2.5 MG PO TABS
2.5000 mg | ORAL_TABLET | Freq: Two times a day (BID) | ORAL | Status: DC
  Administered 2020-11-10 – 2020-11-18 (×17): 2.5 mg
  Filled 2020-11-10 (×17): qty 1

## 2020-11-10 MED ORDER — DEXMEDETOMIDINE HCL IN NACL 400 MCG/100ML IV SOLN
0.0000 ug/kg/h | INTRAVENOUS | Status: DC
  Administered 2020-11-10 (×2): 0.6 ug/kg/h via INTRAVENOUS
  Administered 2020-11-11: 0.06 ug/kg/h via INTRAVENOUS
  Filled 2020-11-10 (×4): qty 100

## 2020-11-10 NOTE — Progress Notes (Signed)
NAMEHanah Moultry, MRN:  [id number], DOB:  03/26/1951, LOS: 83 ADMISSION DATE:  08/31/2020, CONSULTATION DATE:  09/07/2020 REFERRING MD:  Dr Candiss Norse, CHIEF COMPLAINT:  Acute resp failure  Brief History   70 year old female who was previously diagnosed with Covid 08/23/2020.  Admitted 11/2 with AF-RVR, found to have a perforated duodenal underwent exploratory laparotomy with Phillip Heal patch placement 11/3.  Past Medical History  Covid pneumonia Atrial fibrillation CKD stage III Diabetes mellitus Hypertension Colon cancer Hyperlipidemia  Significant Hospital Events   11/2 Admitted  11/3 OR with findings of perforated duodenal ulcer 11/10 progressive hemorrhagic shock, intubated, transfused, pressors, proned; started on CRRT in PM 11/03 Exploratory laparotomy, Phillip Heal patch, lysis of adhesion for duodenal ulceration postop day 6 11/16 Extubated. Re-intubated overnight due to respiratory distress and hypoxia with decreased mentation 11/18 Bronch, cultures sent 11/19 Hgb down getting blood 11/24 Spiked fever resume empirical antimicrobial therapy 11/26 Hemorrhagic shock, hgb 5.6, increased pressors, CT A/P  11/30 Per palliative "[name] expresses understanding that patient is unfortunately very fragile despite ongoing intensive medical treatment and full mechanical support. She indicates that the family wants to continue with all current interventions despite potential outcomes". 12/08 CRRT discontinued due to clotting 12/09 Family requested transfer to tertiary care [facility]). [facility] denied transfer  12/10 CRRT restarted. Episodes of tachycardia, tachypnea that seem to improve with pain management 12/11 Back in shock. Pressor requirements up. CXR worse. ABX resumed 12/12 Still requiring inc pressors. Had hypoglycemic event. Basal insulin dosing adjusted 12/13 Pressor requirements better. Now hyperglycemic. Re-adjusted Glycemic control  12/14 Changed dilaudid to1/2 dosing from day further. D/c  vasopressin.  Goals of care reconfirmed with daughter.  Patient continues to desire aggressive care.  Not open to discussing any other option, patient family continues to be hopeful that she will be discharged to home with full recovery in spite of multiple attempts by staff to prepare them that this is unlikely scenario 12/15 Dilaudid discontinued sending cortisol for ongoing pressor dependence 12/16 Serum cortisol <20, added stress dose steroids.  PRN Dilaudid, attempting not go back on Dilaudid infusion 12/17 Developed worsening tachycardia during the evening hours received initially IV albumin, followed by resuming IV Dilaudid with question of suboptimal pain control.  Currently looks better back on Dilaudid drip.  We have been able to wean pressors after adding stress dose steroids; near arrest - bradycardia, better with atropine  12/19 Afebrile . Remains on dilaudid and heparin gtt, dilaudid gtt increased overnight for concern of pain / ongoing tachycardia, no other events . NE and precedex off 12/17. Ongoing CRRT- even UF, dosing lokelmia this morning 12/20 On CRRT.  Renal plans for HD cath and moving to HD. Getting wound care  12/23 -No vent weaning per RT, remains on full support, #8 trach in place but previously had #6, no documentation of change noted in chart Afebrile / WBC 20.4  Vent - 30% FiO2, PEEP 5 Glucose range 176-212 I/O 465 ml stool, 3.6L removed with HD, -1.1L in last 24 hours  RN reports ongoing periods of tachypnea / vent dyssynchrony that responds to dilaudid   12/24 - renal stopping CRRT today and plans HD 10/24/20 . 40% fio2 on vent via Trach. TAchypenic and tachycardic. Afebrile but wbc up to 27.6K. On TF. Onn heparin gtt  12/25 - Back on CRRT. On vent via trach at 40% fio2, On scheduled dilaudid as add on to oxy. Per family request 12/24 - they felt scheduled oxy was not adequate and patient was showing  signs of opioid withdrawal.  Patient also had  worsening SIRS/sepsis  syndrome. Had fever, rising wbc, worsening RR 40-60 and HR 140s sinus-> started On abx yesterday. Fever some better today. WBC plateau at 28,.5K. On new levophed -> signifanct improvement in HR 77 and RR t0 20. On heparin gtt.  On precedex gtt. On levophed gtt 48mcg wthi midodrine. On TF 12/30 Remains on CRRT with intermittent pressor requirements. Ethics consult placed evening of 12/29. Ethics rec time trial of CRRT 12/31 failed SBT with RR 40s. Several conversations between care tam and family, who are upset RE plan of care 1/1 back on pressors  1/4: On pressors, keeping even on CVVHD, HGB drop to 6.9, transfused 1 unit. Improving mental status 1/10 remains on low dose levophed 90mcg 1/11 off levo, attempting HD with UF for first time.   Consults:  Cardiology [facility] Nephrology  Ethics  Procedures:  R PICC 11/5 >> A line 11/9 >> out ETT 11/9 > 11/16, 11/16 >> 09/21/2020, 09/21/2020 tracheostomy>> Lt [place] CVL 11/9 >> R IJ trialysis >> out HD catheter 12/1 >>12/20 12/21 - 14.5 Fr, 23 cm right IJ tunneled hemodialysis catheter placement.  Removal of indwelling subclavian catheter.   Significant Diagnostic Tests:  11/3 CT abd/ pelvis > 1. Positive for bowel perforation: Pneumoperitoneum and intermediate density free fluid in the abdomen. Prior total colectomy. The specific site of perforation is unclear-oral contrast present to the proximal jejunum has not obviously leaked. Note that there may be small bowel loops adherent to the ventral abdominal wall along the greater curve of the stomach. 2. Extensive bilateral lower lung pneumonia. No pleural effusion. 3. Other abdominal and pelvic viscera are stable since 2015, including bilateral adrenal adenomas. Chronic renal parapelvic cysts. 4. Aortic Atherosclerosis 11/3 TTE > EF 70-75%, RV not well visualized, mildly reduced RV systolic function 69/67 CT chest/ abd/ pelvis> 1. Interval progression of diffuse bilateral hazy ground-glass airspace  opacities with more focal areas of consolidation at the lung bases 2. Trace bilateral pleural effusions. 3. Postsurgical changes the abdomen as detailed above. No evidence for a postoperative abscess, however evaluation is limited by lack of IV contrast. 4. There is a 1.9 cm cystic appearing lesion located in the pancreatic body. This was not present on the patient's CT from 2015.  Follow-up with an outpatient contrast enhanced MRI is recommended. 5. The endometrial stripe appears diffusely thickened. Follow-up with pelvic ultrasound is recommended. Aortic Atherosclerosis 11/14 LE doppler studies > + DVT of right posterior tibial and peroneal vein, +dVT of left posterior tibial vein  11/26 CT ABD PEL > liver unremarkable, distended gallbladder with layering tiny gallstones versus sludge, no duct dilatation, mild hyperdensity right upper pole renal collecting system new.  No evidence of retroperitoneal bleeding.  Multifocal lower lobe predominant pulmonary infiltrates/pneumonia.  Small left pleural effusion.  Micro Data:  11/10 MRSA PCR > neg 11/10 BC x 2 > neg 09/22/2020 blood cultures x2>> S epi.  09/22/2020 sputum culture>> MRSA Blood 12/1 >> negative. S.epi 1 out of 2, likely contaminant BCx 2 12/12 >> neg xxx Trach aspirate 12/24 - FEW STAPHYLOCOCCUS AUREUS  Blood 12/24 > Negative  Blood 1/2: neg Sputum 1/4: MSSA  Antimicrobials:  azithro 11/2 >11/3 Ceftriaxone 11/2  Fluconazole 11/3 Zosyn 11/3 >> 11/7 Vanc 11/10 off Cefepime 11/10 > 11/16 09/22/2020 vancomycin for MRSA PNA >> 12/2 xxxx Vanc 12/11> 12/17 Zosyn 12/11> 12/17 xxxx vanc 12/24 (staph resp cutlure) >> 12/28 Zosyn 12/24 - 12/27 Cefazolin 12/27 >>1/1 linezolid 1/2 eraxis  1/2  Meropenem 1/2 > 1/8  Subjective:  Currently off vasopressors.  Objective   Blood pressure (!) 94/55, pulse 99, temperature 99.3 F (37.4 C), temperature source Axillary, resp. rate (!) 28, height 5\' 8"  (1.727 m), weight 86.5 kg, SpO2 98  %.    Vent Mode: PRVC FiO2 (%):  [30 %] 30 % Set Rate:  [28 bmp] 28 bmp Vt Set:  [450 mL] 450 mL PEEP:  [5 cmH20] 5 cmH20 Plateau Pressure:  [21 cmH20-25 cmH20] 25 cmH20   Intake/Output Summary (Last 24 hours) at 11/10/2020 0836 Last data filed at 11/10/2020 0700 Gross per 24 hour  Intake 1784.5 ml  Output 1025 ml  Net 759.5 ml   Filed Weights   11/07/20 1045 11/09/20 0645 11/09/20 0949  Weight: 82.7 kg 87 kg 86.5 kg   Physical Exam  General: Frail elderly female currently on full mechanical dilatory support HEENT: Tracheostomy in place feeding tube in place Neuro: She does open her eyes and tracks and is able to communicate although weakly CV: Heart sounds are regular PULM: Diminished throughout Vent pressure regulated volume control FIO2 30% PEEP 5 RATE set rate of 28 overbreathes by 3 VT 450 mL  GI: soft, bsx4 active ostomy is in place.  Wound dressing is placed dry and intact Extremities: warm/dry,  2+edema  Skin: no rashes or lesions   Resolved problems:  Previously hemorrhagic shock requiring PRBCs, Septic shock, MRSA pneumonia s/p 8 days abx Complicated by MRSA healthcare associated pneumonia (treated) Peritonitis and perforated ulcer post repair 09/01/20 MSSA HAP  Assessment:  Keiyana Stehr is a 70 y.o. with history of COVID 19 infection requiring hospitalization in October 2021 with current admission for hemorrhagic shock secondary to perforated gastric ulcer requiring surgical intervention and [place]. Her medical issues are as follows:  Acute metabolic encephalopathy : Prolonged critical illness, sedation/pain  Continue to wean Precedex as able.  We will attempt to wean by 0.2 mg daily until off. 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  Currently off vasopressors High-dose midodrine Except lower blood pressure  Renal failure Lab Results  Component Value Date   CREATININE 1.16 (H) 11/10/2020   CREATININE 0.87 11/09/2020   CREATININE 1.48 (H) 11/09/2020   Nephrology continues to follow Currently tolerating intermittent hemodialysis Was able to ultrafiltrate 0.5 L on 11/10/2020 Continue intermittent hemodialysis per nephrology Try to keep off vasopressors using high-dose midodrine   Afib/DVT Recent Labs    11/09/20 0449 11/10/20 0559  HGB 7.3* 7.0*    HGB drop 1/4 to 6.9>> received 1 unit PRBC No obvious bleeding, suspect nosocomial losses with chronic disease Transfuse per protocol  Transition from heparin to Eliquis  Critical Illness Myopathy Severe deconditioning  Inadequate PO intake  Continue physical therapy. Continue enteral nutrition per dietitian  DM2 with hyperglycemia CBG (last 3)  Recent Labs    11/09/20 2308 11/10/20 0309 11/10/20 0810  GLUCAP 125* 118* 180*    Continue sliding scale insulin plan:  Stage IV sacral ulcer Continue care per wound ostomy care consult  History of stage IV colon cancer 1.9 cm cystic-appearing lesion located in pancreatic body no intervention at this time.  1.9  cm cystic appearing lesion located in the pancreatic body  GOC -multiple [place] conversations with various PCCM providers and family have not yielded changes in scope of tx, multiple PCCM providers have been fired by family.  -concerns for futility of certain interventions -ethics consulted 12/30-- recs for time limited trial of CRRT, please see separate Ethics consult note for full details. Futility policy reviewed with family by ethics. Ongoing meetings with Dr. Hulen Skains with nursing administration and family. The ultimate goal is to transition her  safely to HD from CRRT which has proven difficult thus far.  Note interim hemodialysis pulled 0.5 L.  Currently remains off CRRT.  Best practice:  Diet: EN  Pain/Anxiety/Delirium protocol (if indicated): Titrating precedex.   VAP protocol (if indicated): yes DVT prophylaxis: IV heparin GI prophylaxis: Protonix Glucose control: SSI, TF coverage, lantus Mobility: PT Family communication: Family updates daily per MD  App cct 43 min  Richardson Landry Merdith Boyd ACNP Acute Care Nurse Practitioner [place] Please consult Amion 11/10/2020, 8:36 AM

## 2020-11-10 NOTE — Progress Notes (Addendum)
ANTICOAGULATION CONSULT NOTE  Pharmacy Consult:  Heparin Indication: atrial fibrillation   Labs: Recent Labs    11/08/20 0500 11/08/20 1637 11/09/20 0449 11/09/20 1200 11/09/20 1746 11/10/20 0559  HGB 7.4*  --  7.3*  --   --  7.0*  HCT 24.8*  --  23.7*  --   --  22.5*  PLT 233  --  245  --   --  256  HEPARINUNFRC 0.29*  --  0.21* 0.36  --  0.27*  CREATININE 1.21*   < > 1.48*  --  0.87 1.16*   < > = values in this interval not displayed.    Assessment: 70 yo female with history of Afib on apixaban prior to admission, which has been held and patient transitioned to IV heparin.   Hep lvl slightly low this am at 0.27 Cbc stable  Goal of Therapy:  Heparin level 0.3-0.5 units/ml   Plan:  DC Heparin gtt Convert back to apixaban 2.5 mg bid  Barth Kirks, PharmD, BCPS, BCCCP Clinical Pharmacist [phone]  Please check AMION for all [place] numbers  11/10/2020 7:19 AM

## 2020-11-10 NOTE — Progress Notes (Signed)
[facility] Progress Note   Assessment/ Plan:   # AKI- oliguric now likely anuric. ATN for shock.Started on CRRT 09/08/20 due to persistent oliguria/anuria and hyperkalemia.[place] placed on 10/19/20. -Patient has previously failed multiple transitions to HD w/ low Bps requiring pressors -No signs of meaningful recovery from a kidney perspective -Family expressed ongoing wishes for aggressive care -Ethics seen 12/30 and suggested time trial of 5-7 days CV while finishing treatment for PNA - CRRT stopped 11/03/20 at 1700 -HD with UF profile + albumin x 2+ midodrine -next treatment tomorrow -moving forward, if she does not tolerate any ultrafiltration with HD then renal replacement therapy in general (including CRRT) will be futile.  -Would recommend against re-initiation of CRRT at that point given futility and would need to discuss with the family what the next steps will be (which would be hospice at that point) -high BUN: possibly secondary to catabolic state/severe illness  #Hyperkalemia: 2/2 renal failure.  Status post Lokelma with improvement.  Dialysis as above.  Could consider lokelma in between treatments if hyperK does develop  # h/o hemorrhagic shock/ ABLA- ongoing blood lossTransfuse as needed, have added ESA.    # Septic shock: still requiring low-dose pressors, over max dose of midodrine, on meropenem empirically.  Mgmt per primary  # Perforated duodenal ulcer- s/p exploratory lap and Graham patch placement per surgery.  # Acute hypoxic/hypercapnic respiratory failure due to ARDS from Recent Covid-19 PNA- currently on vent via trach per PCCM. Weaning efforts have been slow progress.  # Atrial fibrillation- on A/C per primary svc  # Severe protein malnutrition- per primary svc.   #Hypercalcemia: immobility and CKD contributing, PTH 200 and Vit D normal. Calcium okay today. Could consider Sensipar if continued issue.   # Disposition- poor overall  prognosis.    Subjective:    No acute events, tolerated HD without pressors yesterday. Was finally able to have some UF w/ HD: net uf 500cc. Discussed with daughter at the bedside   Objective:   BP (!) 98/51   Pulse (!) 111   Temp (!) 100.4 F (38 C) (Axillary)   Resp (!) 29   Ht 5' 8"  (1.727 m)   Wt 86.5 kg   SpO2 98%   BMI 29.00 kg/m   Physical Exam: Gen: lying in bed, chronically ill appearing HEENT: trach in place CVS: tachy Resp: unlabored, trach Abd: soft, nontender Ext: edema bilateral UE's ACCESS: R IJ TDC  Labs: BMET Recent Labs  Lab 11/04/20 0338 11/04/20 1843 11/05/20 0350 11/07/20 0443 11/07/20 1645 11/08/20 0500 11/08/20 1637 11/09/20 0449 11/09/20 1746 11/10/20 0559  NA 135 134*   < > 137 135 135 135 135 134* 133*  K 6.1* 3.7   < > 5.6* 3.6 4.0 4.2 4.3 3.1* 3.4*  CL 104 99   < > 103 100 99 100 101 99 97*  CO2 21* 24   < > 17* 24 21* 21* 21* 23 21*  GLUCOSE 118* 117*   < > 198* 151* 168* 70 101* 133* 160*  BUN 90* 57*   < > 213* 101* 121* 135* 134* 66* 79*  CREATININE 0.93 0.64   < > 1.78* 0.99 1.21* 1.33* 1.48* 0.87 1.16*  CALCIUM 11.5* 9.8   < > 9.2 8.1* 8.9 9.2 9.6 8.7* 8.7*  PHOS 5.8* 3.5  --   --   --   --   --   --   --   --    < > = values  in this interval not displayed.   CBC Recent Labs  Lab 11/07/20 0443 11/08/20 0500 11/09/20 0449 11/10/20 0559  WBC 12.3* 11.4* 12.4* 10.3  HGB 7.3* 7.4* 7.3* 7.0*  HCT 23.2* 24.8* 23.7* 22.5*  MCV 93.9 96.5 94.4 93.8  PLT 228 233 245 256      Medications:    . apixaban  2.5 mg Per Tube BID  . B-complex with vitamin C  1 tablet Per Tube Daily  . chlorhexidine gluconate (MEDLINE KIT)  15 mL Mouth Rinse BID  . Chlorhexidine Gluconate Cloth  6 each Topical Daily  . Chlorhexidine Gluconate Cloth  6 each Topical Q0600  . cholecalciferol  2,000 Units Per Tube Daily  . clonazepam  0.5 mg Per Tube TID  . darbepoetin (ARANESP) injection - DIALYSIS  100 mcg Intravenous Q Tue-HD  . docusate  100  mg Per Tube BID  . feeding supplement (PROSource TF)  90 mL Per Tube TID  . fentaNYL  1 patch Transdermal Q72H  . guaiFENesin  15 mL Per Tube Q12H  .  HYDROmorphone (DILAUDID) injection  1 mg Intravenous Q6H  . influenza vaccine adjuvanted  0.5 mL Intramuscular Tomorrow-1000  . insulin aspart  0-15 Units Subcutaneous Q4H  . insulin aspart  4 Units Subcutaneous Q4H  . insulin glargine  15 Units Subcutaneous BID  . mouth rinse  15 mL Mouth Rinse 10 times per day  . midodrine  40 mg Per Tube TID WC  . nutrition supplement (JUVEN)  1 packet Per Tube BID BM  . pantoprazole sodium  40 mg Per Tube BID  . polyethylene glycol  17 g Per Tube Daily  . QUEtiapine  100 mg Per Tube QHS  . QUEtiapine  50 mg Per Tube Daily  . sodium chloride flush  10-40 mL Intracatheter Q12H  . sodium hypochlorite   Irrigation BID    Gean Quint  11/10/2020, 12:36 PM

## 2020-11-11 ENCOUNTER — Inpatient Hospital Stay (HOSPITAL_COMMUNITY): Payer: Medicare Other

## 2020-11-11 DIAGNOSIS — I4891 Unspecified atrial fibrillation: Secondary | ICD-10-CM | POA: Diagnosis not present

## 2020-11-11 LAB — BASIC METABOLIC PANEL
Anion gap: 14 (ref 5–15)
Anion gap: 18 — ABNORMAL HIGH (ref 5–15)
BUN: 112 mg/dL — ABNORMAL HIGH (ref 8–23)
BUN: 121 mg/dL — ABNORMAL HIGH (ref 8–23)
CO2: 20 mmol/L — ABNORMAL LOW (ref 22–32)
CO2: 19 mmol/L — ABNORMAL LOW (ref 22–32)
Calcium: 8.9 mg/dL (ref 8.9–10.3)
Calcium: 9.2 mg/dL (ref 8.9–10.3)
Chloride: 101 mmol/L (ref 98–111)
Chloride: 100 mmol/L (ref 98–111)
Creatinine, Ser: 1.4 mg/dL — ABNORMAL HIGH (ref 0.44–1.00)
Creatinine, Ser: 1.55 mg/dL — ABNORMAL HIGH (ref 0.44–1.00)
GFR, Estimated: 41 mL/min — ABNORMAL LOW (ref >60)
GFR, Estimated: 36 mL/min — ABNORMAL LOW (ref >60)
Glucose, Bld: 189 mg/dL — ABNORMAL HIGH (ref 70–99)
Glucose, Bld: 97 mg/dL (ref 70–99)
Potassium: 3.8 mmol/L (ref 3.5–5.1)
Potassium: 4 mmol/L (ref 3.5–5.1)
Sodium: 135 mmol/L (ref 135–145)
Sodium: 137 mmol/L (ref 135–145)

## 2020-11-11 LAB — CBC WITH DIFFERENTIAL/PLATELET
Abs Immature Granulocytes: 0.19 10*3/uL — ABNORMAL HIGH (ref 0.00–0.07)
Basophils Absolute: 0.1 10*3/uL (ref 0.0–0.1)
Basophils Relative: 1 %
Eosinophils Absolute: 0.5 10*3/uL (ref 0.0–0.5)
Eosinophils Relative: 4 %
HCT: 21.6 % — ABNORMAL LOW (ref 36.0–46.0)
Hemoglobin: 6.7 g/dL — CL (ref 12.0–15.0)
Immature Granulocytes: 2 %
Lymphocytes Relative: 19 %
Lymphs Abs: 2 10*3/uL (ref 0.7–4.0)
MCH: 29.5 pg (ref 26.0–34.0)
MCHC: 31 g/dL (ref 30.0–36.0)
MCV: 95.2 fL (ref 80.0–100.0)
Monocytes Absolute: 1.1 10*3/uL — ABNORMAL HIGH (ref 0.1–1.0)
Monocytes Relative: 10 %
Neutro Abs: 7.1 10*3/uL (ref 1.7–7.7)
Neutrophils Relative %: 64 %
Platelets: 266 10*3/uL (ref 150–400)
RBC: 2.27 MIL/uL — ABNORMAL LOW (ref 3.87–5.11)
RDW: 17.5 % — ABNORMAL HIGH (ref 11.5–15.5)
WBC: 10.8 10*3/uL — ABNORMAL HIGH (ref 4.0–10.5)
nRBC: 0.4 % — ABNORMAL HIGH (ref 0.0–0.2)

## 2020-11-11 LAB — PHOSPHORUS: Phosphorus: 8.8 mg/dL — ABNORMAL HIGH (ref 2.5–4.6)

## 2020-11-11 LAB — GLUCOSE, CAPILLARY
Glucose-Capillary: 148 mg/dL — ABNORMAL HIGH (ref 70–99)
Glucose-Capillary: 208 mg/dL — ABNORMAL HIGH (ref 70–99)
Glucose-Capillary: 159 mg/dL — ABNORMAL HIGH (ref 70–99)
Glucose-Capillary: 205 mg/dL — ABNORMAL HIGH (ref 70–99)
Glucose-Capillary: 110 mg/dL — ABNORMAL HIGH (ref 70–99)
Glucose-Capillary: 94 mg/dL (ref 70–99)

## 2020-11-11 LAB — MAGNESIUM: Magnesium: 1.6 mg/dL — ABNORMAL LOW (ref 1.7–2.4)

## 2020-11-11 LAB — PREPARE RBC (CROSSMATCH)

## 2020-11-11 MED ORDER — SODIUM CHLORIDE 0.9 % IV SOLN
125.0000 mg | Freq: Once | INTRAVENOUS | Status: AC
  Administered 2020-11-11: 125 mg via INTRAVENOUS
  Filled 2020-11-11: qty 10

## 2020-11-11 MED ORDER — DEXMEDETOMIDINE HCL IN NACL 400 MCG/100ML IV SOLN: 0.0000 ug/kg/h | INTRAVENOUS | Status: AC

## 2020-11-11 MED ORDER — INSULIN GLARGINE 100 UNIT/ML ~~LOC~~ SOLN
21.0000 [IU] | Freq: Two times a day (BID) | SUBCUTANEOUS | Status: DC
  Administered 2020-11-11: 21 [IU] via SUBCUTANEOUS
  Filled 2020-11-11 (×4): qty 0.21

## 2020-11-11 MED ORDER — HEPARIN SODIUM (PORCINE) 1000 UNIT/ML IJ SOLN
INTRAMUSCULAR | Status: AC
  Filled 2020-11-11: qty 4

## 2020-11-11 MED ORDER — SODIUM CHLORIDE 0.9% IV SOLUTION: Freq: Once | INTRAVENOUS | Status: DC

## 2020-11-11 MED ORDER — AMIODARONE HCL IN DEXTROSE 360-4.14 MG/200ML-% IV SOLN: 30.0000 mg/h | INTRAVENOUS | Status: DC

## 2020-11-11 MED ORDER — AMIODARONE IV BOLUS ONLY 150 MG/100ML
150.0000 mg | Freq: Once | INTRAVENOUS | Status: AC
  Administered 2020-11-12: 150 mg via INTRAVENOUS
  Filled 2020-11-11: qty 100

## 2020-11-11 MED ORDER — ORAL CARE MOUTH RINSE
15.0000 mL | Freq: Four times a day (QID) | OROMUCOSAL | Status: DC
  Administered 2020-11-12 – 2020-12-22 (×123): 15 mL via OROMUCOSAL

## 2020-11-11 MED ORDER — ALBUMIN HUMAN 25 % IV SOLN
INTRAVENOUS | Status: AC
  Filled 2020-11-11: qty 200

## 2020-11-11 MED ORDER — AMIODARONE HCL IN DEXTROSE 360-4.14 MG/200ML-% IV SOLN
60.0000 mg/h | INTRAVENOUS | Status: AC
  Administered 2020-11-12: 60 mg/h via INTRAVENOUS
  Filled 2020-11-11: qty 200

## 2020-11-11 MED ORDER — INSULIN GLARGINE 100 UNIT/ML ~~LOC~~ SOLN
20.0000 [IU] | Freq: Two times a day (BID) | SUBCUTANEOUS | Status: DC
  Filled 2020-11-11 (×2): qty 0.2

## 2020-11-11 NOTE — Progress Notes (Signed)
[facility] Progress Note   Assessment/ Plan:   # AKI- oliguric now likely anuric. ATN for shock.Started on CRRT 09/08/20 due to persistent oliguria/anuria and hyperkalemia.[place] placed on 10/19/20. -Patient has previously failed multiple transitions to HD w/ low Bps requiring pressors -No signs of meaningful recovery from a kidney perspective -Family expressed ongoing wishes for aggressive care -Ethics seen 12/30 and suggested time trial of 5-7 days CV while finishing treatment for PNA - CRRT stopped 11/03/20 at 1700 -HD with UF profile + albumin x 2+ midodrine -next treatment today, aiming for 0.5L-1L UF -moving forward, if she does not tolerate any ultrafiltration with HD then renal replacement therapy in general (including CRRT) will be futile.  -Would recommend against re-initiation of CRRT at that point given futility and would need to discuss with the family what the next steps will be (which would be hospice at that point) -high BUN: possibly secondary to catabolic state/severe illness?  #Hyperkalemia: 2/2 renal failure.  Status post Lokelma with improvement.  Dialysis as above.  Could consider lokelma in between treatments if hyperK does develop.  # h/o hemorrhagic shock/ ABLA- ongoing blood lossTransfuse as needed, have added ESA.  1u prbc today  # Septic shock: still requiring low-dose pressors, over max dose of midodrine, on meropenem empirically.  Mgmt per primary  # Perforated duodenal ulcer- s/p exploratory lap and Graham patch placement per surgery.  # Acute hypoxic/hypercapnic respiratory failure due to ARDS from Recent Covid-19 PNA- currently on vent via trach per PCCM. Weaning efforts have been slow progress.  # Atrial fibrillation- on A/C per primary svc  # Severe protein malnutrition- per primary svc.   #Hypercalcemia: immobility and CKD contributing, PTH 200 and Vit D normal. Calcium okay today. Could consider Sensipar if continued issue.    # Disposition- poor overall prognosis.    Subjective:   No acute events, 1u prbc. No pressor requirement. Discussed w/ daughter at bedside. Objective:   BP (!) 101/55   Pulse (!) 108   Temp 99.1 F (37.3 C) (Oral)   Resp (!) 28   Ht 5' 8"  (1.727 m)   Wt 86.5 kg   SpO2 100%   BMI 29.00 kg/m   Physical Exam: Gen: lying in bed, chronically ill appearing HEENT: trach in place CVS: tachy Resp: unlabored, trach Abd: soft, nontender Ext: edema bilateral UE's ACCESS: R IJ TDC  Labs: BMET Recent Labs  Lab 11/04/20 1843 11/05/20 0350 11/08/20 0500 11/08/20 1637 11/09/20 0449 11/09/20 1746 11/10/20 0559 11/10/20 1950 11/11/20 0351  NA 134*   < > 135 135 135 134* 133* 133* 135  K 3.7   < > 4.0 4.2 4.3 3.1* 3.4* 3.7 3.8  CL 99   < > 99 100 101 99 97* 100 101  CO2 24   < > 21* 21* 21* 23 21* 21* 20*  GLUCOSE 117*   < > 168* 70 101* 133* 160* 204* 189*  BUN 57*   < > 121* 135* 134* 66* 79* 104* 112*  CREATININE 0.64   < > 1.21* 1.33* 1.48* 0.87 1.16* 1.27* 1.40*  CALCIUM 9.8   < > 8.9 9.2 9.6 8.7* 8.7* 8.7* 8.9  PHOS 3.5  --   --   --   --   --   --   --  8.8*   < > = values in this interval not displayed.   CBC Recent Labs  Lab 11/08/20 0500 11/09/20 0449 11/10/20 0559 11/11/20 0351  WBC 11.4* 12.4*  10.3 10.8*  NEUTROABS  --   --   --  7.1  HGB 7.4* 7.3* 7.0* 6.7*  HCT 24.8* 23.7* 22.5* 21.6*  MCV 96.5 94.4 93.8 95.2  PLT 233 245 256 266      Medications:    . sodium chloride   Intravenous Once  . apixaban  2.5 mg Per Tube BID  . B-complex with vitamin C  1 tablet Per Tube Daily  . chlorhexidine gluconate (MEDLINE KIT)  15 mL Mouth Rinse BID  . Chlorhexidine Gluconate Cloth  6 each Topical Daily  . Chlorhexidine Gluconate Cloth  6 each Topical Q0600  . cholecalciferol  2,000 Units Per Tube Daily  . clonazepam  0.5 mg Per Tube TID  . darbepoetin (ARANESP) injection - DIALYSIS  100 mcg Intravenous Q Tue-HD  . docusate  100 mg Per Tube BID  . feeding  supplement (PROSource TF)  90 mL Per Tube TID  . fentaNYL  1 patch Transdermal Q72H  . guaiFENesin  15 mL Per Tube Q12H  .  HYDROmorphone (DILAUDID) injection  1 mg Intravenous Q6H  . influenza vaccine adjuvanted  0.5 mL Intramuscular Tomorrow-1000  . insulin aspart  0-15 Units Subcutaneous Q4H  . insulin aspart  4 Units Subcutaneous Q4H  . insulin glargine  21 Units Subcutaneous BID  . mouth rinse  15 mL Mouth Rinse 10 times per day  . midodrine  40 mg Per Tube TID WC  . nutrition supplement (JUVEN)  1 packet Per Tube BID BM  . pantoprazole sodium  40 mg Per Tube BID  . polyethylene glycol  17 g Per Tube Daily  . QUEtiapine  100 mg Per Tube QHS  . QUEtiapine  50 mg Per Tube Daily  . sodium chloride flush  10-40 mL Intracatheter Q12H  . sodium hypochlorite   Irrigation BID    Gean Quint  11/11/2020, 10:51 AM

## 2020-11-11 NOTE — Progress Notes (Signed)
Pt was placed on trach collar 28%/5L and is tolerating well at this time. Pt has moderate secretions, but is able to cough them up. RT will monitor.

## 2020-11-11 NOTE — TOC Progression Note (Addendum)
Transition of Care [facility]) - Progression Note    Patient Details  Name: Candace [name] MRN: [id number] Date of Birth: November 17, 1950  [facility]) CM/SW [place], RN Phone Number: 11/11/2020, 4:21 PM  Clinical Narrative:     Patient ventilated with trach, Ethics consult at the end of 2021. Family continues to proceed with aggressive medical treatment and all life prolonging measures. Palliative care signed off.look at SNF placement. When she can stay on mist collar 24/7  Expected Discharge Plan: Long Term Acute Care (LTAC) Barriers to Discharge: Continued Medical Work up  Expected Discharge Plan and Services Expected Discharge Plan: Long Term Acute Care (LTAC)   Discharge Planning Services: CM Consult Post Acute Care Choice: Long Term Acute Care (LTAC) Living arrangements for the past 2 months: Single Family Home                 DME Arranged: N/A DME Agency: NA       HH Arranged: NA HH Agency: NA         Social Determinants of Health (SDOH) Interventions    Readmission Risk Interventions No flowsheet data found.

## 2020-11-11 NOTE — Progress Notes (Signed)
Patient ID: Candace [name], female   DOB: 08/05/1951, 70 y.o.   MRN: [id number]  This NP reviewed medical records.  Complex hospital course.   Attendings continue to address GOCs with family along with medical updates.   Ethics consult noted.   Family remain open to all offered and available medical interventions to prolong life, no changes in scope of treatment.  Goals are clearly set.  Palliative medicine will sign off at this time.  Please reconsult if we can be of assistance in the future.  No charge  Wadie Lessen NP  Palliative Medicine Team Team Phone # [phone]

## 2020-11-11 NOTE — Progress Notes (Signed)
NAMEShayma Pfefferle, MRN:  [id number], DOB:  06/09/1951, LOS: 71 ADMISSION DATE:  08/31/2020, CONSULTATION DATE:  09/07/2020 REFERRING MD:  Dr Candiss Norse, CHIEF COMPLAINT:  Acute resp failure  Brief History   70 year old female who was previously diagnosed with Covid 08/23/2020.  Admitted 11/2 with AF-RVR, found to have a perforated duodenal underwent exploratory laparotomy with Phillip Heal patch placement 11/3.  Past Medical History  Covid pneumonia Atrial fibrillation CKD stage III Diabetes mellitus Hypertension Colon cancer Hyperlipidemia  Significant Hospital Events   11/2 Admitted  11/3 OR with findings of perforated duodenal ulcer 11/10 progressive hemorrhagic shock, intubated, transfused, pressors, proned; started on CRRT in PM 11/03 Exploratory laparotomy, Phillip Heal patch, lysis of adhesion for duodenal ulceration postop day 6 11/16 Extubated. Re-intubated overnight due to respiratory distress and hypoxia with decreased mentation 11/18 Bronch, cultures sent 11/19 Hgb down getting blood 11/24 Spiked fever resume empirical antimicrobial therapy 11/26 Hemorrhagic shock, hgb 5.6, increased pressors, CT A/P  11/30 Per palliative "[name] expresses understanding that patient is unfortunately very fragile despite ongoing intensive medical treatment and full mechanical support. She indicates that the family wants to continue with all current interventions despite potential outcomes". 12/08 CRRT discontinued due to clotting 12/09 Family requested transfer to tertiary care [facility]). [facility] denied transfer  12/10 CRRT restarted. Episodes of tachycardia, tachypnea that seem to improve with pain management 12/11 Back in shock. Pressor requirements up. CXR worse. ABX resumed 12/12 Still requiring inc pressors. Had hypoglycemic event. Basal insulin dosing adjusted 12/13 Pressor requirements better. Now hyperglycemic. Re-adjusted Glycemic control  12/14 Changed dilaudid to1/2 dosing from day further. D/c  vasopressin.  Goals of care reconfirmed with daughter.  Patient continues to desire aggressive care.  Not open to discussing any other option, patient family continues to be hopeful that she will be discharged to home with full recovery in spite of multiple attempts by staff to prepare them that this is unlikely scenario 12/15 Dilaudid discontinued sending cortisol for ongoing pressor dependence 12/16 Serum cortisol <20, added stress dose steroids.  PRN Dilaudid, attempting not go back on Dilaudid infusion 12/17 Developed worsening tachycardia during the evening hours received initially IV albumin, followed by resuming IV Dilaudid with question of suboptimal pain control.  Currently looks better back on Dilaudid drip.  We have been able to wean pressors after adding stress dose steroids; near arrest - bradycardia, better with atropine  12/19 Afebrile . Remains on dilaudid and heparin gtt, dilaudid gtt increased overnight for concern of pain / ongoing tachycardia, no other events . NE and precedex off 12/17. Ongoing CRRT- even UF, dosing lokelmia this morning 12/20 On CRRT.  Renal plans for HD cath and moving to HD. Getting wound care  12/23 -No vent weaning per RT, remains on full support, #8 trach in place but previously had #6, no documentation of change noted in chart Afebrile / WBC 20.4  Vent - 30% FiO2, PEEP 5 Glucose range 176-212 I/O 465 ml stool, 3.6L removed with HD, -1.1L in last 24 hours  RN reports ongoing periods of tachypnea / vent dyssynchrony that responds to dilaudid   12/24 - renal stopping CRRT today and plans HD 10/24/20 . 40% fio2 on vent via Trach. TAchypenic and tachycardic. Afebrile but wbc up to 27.6K. On TF. Onn heparin gtt  12/25 - Back on CRRT. On vent via trach at 40% fio2, On scheduled dilaudid as add on to oxy. Per family request 12/24 - they felt scheduled oxy was not adequate and patient was showing  signs of opioid withdrawal.  Patient also had  worsening SIRS/sepsis  syndrome. Had fever, rising wbc, worsening RR 40-60 and HR 140s sinus-> started On abx yesterday. Fever some better today. WBC plateau at 28,.5K. On new levophed -> signifanct improvement in HR 77 and RR t0 20. On heparin gtt.  On precedex gtt. On levophed gtt 3mcg wthi midodrine. On TF 12/30 Remains on CRRT with intermittent pressor requirements. Ethics consult placed evening of 12/29. Ethics rec time trial of CRRT 12/31 failed SBT with RR 40s. Several conversations between care tam and family, who are upset RE plan of care 1/1 back on pressors  1/4: On pressors, keeping even on CVVHD, HGB drop to 6.9, transfused 1 unit. Improving mental status 1/10 remains on low dose levophed 41mcg 1/11 off levo, attempting HD with UF for first time.   Consults:  Cardiology [facility] Nephrology  Ethics  Procedures:  R PICC 11/5 >> A line 11/9 >> out ETT 11/9 > 11/16, 11/16 >> 09/21/2020, 09/21/2020 tracheostomy>> Lt [place] CVL 11/9 >> R IJ trialysis >> out HD catheter 12/1 >>12/20 12/21 - 14.5 Fr, 23 cm right IJ tunneled hemodialysis catheter placement.  Removal of indwelling subclavian catheter.   Significant Diagnostic Tests:  11/3 CT abd/ pelvis > 1. Positive for bowel perforation: Pneumoperitoneum and intermediate density free fluid in the abdomen. Prior total colectomy. The specific site of perforation is unclear-oral contrast present to the proximal jejunum has not obviously leaked. Note that there may be small bowel loops adherent to the ventral abdominal wall along the greater curve of the stomach. 2. Extensive bilateral lower lung pneumonia. No pleural effusion. 3. Other abdominal and pelvic viscera are stable since 2015, including bilateral adrenal adenomas. Chronic renal parapelvic cysts. 4. Aortic Atherosclerosis 11/3 TTE > EF 70-75%, RV not well visualized, mildly reduced RV systolic function 43/32 CT chest/ abd/ pelvis> 1. Interval progression of diffuse bilateral hazy ground-glass airspace  opacities with more focal areas of consolidation at the lung bases 2. Trace bilateral pleural effusions. 3. Postsurgical changes the abdomen as detailed above. No evidence for a postoperative abscess, however evaluation is limited by lack of IV contrast. 4. There is a 1.9 cm cystic appearing lesion located in the pancreatic body. This was not present on the patient's CT from 2015.  Follow-up with an outpatient contrast enhanced MRI is recommended. 5. The endometrial stripe appears diffusely thickened. Follow-up with pelvic ultrasound is recommended. Aortic Atherosclerosis 11/14 LE doppler studies > + DVT of right posterior tibial and peroneal vein, +dVT of left posterior tibial vein  11/26 CT ABD PEL > liver unremarkable, distended gallbladder with layering tiny gallstones versus sludge, no duct dilatation, mild hyperdensity right upper pole renal collecting system new.  No evidence of retroperitoneal bleeding.  Multifocal lower lobe predominant pulmonary infiltrates/pneumonia.  Small left pleural effusion.  Micro Data:  11/10 MRSA PCR > neg 11/10 BC x 2 > neg 09/22/2020 blood cultures x2>> S epi.  09/22/2020 sputum culture>> MRSA Blood 12/1 >> negative. S.epi 1 out of 2, likely contaminant BCx 2 12/12 >> neg xxx Trach aspirate 12/24 - FEW STAPHYLOCOCCUS AUREUS  Blood 12/24 > Negative  Blood 1/2: neg Sputum 1/4: MSSA  Antimicrobials:  azithro 11/2 >11/3 Ceftriaxone 11/2  Fluconazole 11/3 Zosyn 11/3 >> 11/7 Vanc 11/10 off Cefepime 11/10 > 11/16 09/22/2020 vancomycin for MRSA PNA >> 12/2 xxxx Vanc 12/11> 12/17 Zosyn 12/11> 12/17 xxxx vanc 12/24 (staph resp cutlure) >> 12/28 Zosyn 12/24 - 12/27 Cefazolin 12/27 >>1/1 linezolid 1/2 eraxis  1/2  Meropenem 1/2 > 1/8  Subjective:  Remains off vasopressors.  Continue to wean Precedex to off today.    Objective   Blood pressure (!) 101/55, pulse (!) 108, temperature 99.1 F (37.3 C), temperature source Oral, resp. rate (!) 28,  height 5\' 8"  (1.727 m), weight 86.5 kg, SpO2 100 %.    Vent Mode: PRVC FiO2 (%):  [28 %-30 %] 28 % Set Rate:  [28 bmp] 28 bmp Vt Set:  [450 mL] 450 mL PEEP:  [5 cmH20] 5 cmH20 Pressure Support:  [10 cmH20] 10 cmH20 Plateau Pressure:  [5 cmH20-23 cmH20] 23 cmH20   Intake/Output Summary (Last 24 hours) at 11/11/2020 0831 Last data filed at 11/11/2020 0600 Gross per 24 hour  Intake 1924.37 ml  Output 250 ml  Net 1674.37 ml   Filed Weights   11/07/20 1045 11/09/20 0645 11/09/20 0949  Weight: 82.7 kg 87 kg 86.5 kg   Physical Exam  General: Frail elderly female in no acute distress at rest HEENT: Tracheostomy is replaced. Neuro: Arouses to voice follows simple commands CV: Heart sounds are regular PULM: Mild expiratory wheeze Vent pressure regulated volume control FIO2 28% PEEP five RATE 28 VT 450  GI: soft, bsx4 active positive colostomy is in place.  Abdominal dressing is dry and intact. GU: Making some urine Extremities: warm/dry, 2+ edema  Skin: no rashes or lesions .   Resolved problems:  Previously hemorrhagic shock requiring PRBCs, Septic shock, MRSA pneumonia s/p 8 days abx Complicated by MRSA healthcare associated pneumonia (treated) Peritonitis and perforated ulcer post repair 09/01/20 MSSA HAP  Assessment:  Karcyn Menn is a 70 y.o. with history of COVID 19 infection requiring hospitalization in October 2021 with current admission for hemorrhagic shock secondary to perforated gastric ulcer requiring surgical intervention and [place]. Her medical issues are as follows:  Acute metabolic encephalopathy : Prolonged critical illness, sedation/pain  Wean Precedex off today 11/11/2020 Continue Klonopin three times a day Early continue as needed Dilaudid Continue fentanyl patch In future we will start decreasing as tolerated. 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  Remains off vasopressors High-dose midodrine Tolerate lower blood pressure  Renal failure Lab Results  Component Value Date   CREATININE 1.40 (H) 11/11/2020   CREATININE 1.27 (H) 11/10/2020   CREATININE 1.16 (H) 11/10/2020   Appreciate nephrology's input She is making some urine on her own Continue intermittent hemodialysis with negative flow Hopefully will stay off vasopressors Continue high-dose midodrine      Afib/DVT Recent Labs    11/10/20 0559 11/11/20 0351  HGB 7.0* 6.7*    HGB drop 1/4 to 6.9>> received 1 unit PRBC No obvious bleeding, suspect nosocomial losses with chronic disease Transfuse per protocol Try IV ferrous sulfate  Critical Illness Myopathy Severe deconditioning  Inadequate PO intake  Continue physical therapy as tolerated Continue nutrition as tolerated  DM2 with hyperglycemia CBG (last 3)  Recent Labs    11/10/20 2310 11/11/20 0311 11/11/20 0721  GLUCAP 195* 148* 208*    New sliding scale insulin protocol Increase insulin Lantus to 21 units  Stage IV sacral ulcer Continue care as described by wound ostomy  History of stage IV colon cancer 1.9 cm cystic-appearing lesion located in pancreatic body no intervention at this time.  No interventions  at this time for one-point sonometer cystic lesion  GOC -multiple GOC conversations with various PCCM providers and family have not yielded changes in scope of tx, multiple PCCM providers have been fired by family.  -concerns for futility of certain interventions -ethics consulted 12/30-- recs for time limited trial of CRRT, please see separate Ethics consult note for full details. Futility policy reviewed with family by ethics. Ongoing meetings with Dr. Hulen Skains with nursing administration and family. The ultimate goal is to transition her safely to HD from  CRRT which has proven difficult thus far.  Note interim hemodialysis pulled 0.5 L.  Currently remains off CRRT. 11/11/2020 remain off CRRT.  Making some urine on her own.  Continuing intermittent hemodialysis for negative draw  Best practice:  Diet: EN  Pain/Anxiety/Delirium protocol (if indicated): Titrating precedex.   VAP protocol (if indicated): yes DVT prophylaxis: IV heparin GI prophylaxis: Protonix Glucose control: SSI, TF coverage, lantus Mobility: PT Family communication: Family updates daily per MD  App cct 52 min  Richardson Landry Lakeyshia Tuckerman ACNP Acute Care Nurse Practitioner [place] Please consult Bear 11/11/2020, 8:31 AM

## 2020-11-11 NOTE — Progress Notes (Signed)
Pt placed on ATC 5L 28% at 0800 and placed back on full support vent settings at 1130. Pt with increased wob, RR (30's) and HR (145). Pt continues to have copious pale, yellow frothy secretions. RT will continue to monitor and be available as needed.

## 2020-11-11 NOTE — Progress Notes (Addendum)
eLink Physician-Brief Progress Note Patient Name: Ashawna Hanback DOB: Mar 08, 1951 MRN: [id number]   Date of Service  11/11/2020  HPI/Events of Note  Notified of SVT in the 150s.   Pt on HD and was previously in sinus tach with rate in the 110s.  Pt on precedex gtt and given dilaudid prn. She appears calm. She is off pressors.   BP 117/58, HR 150s  eICU Interventions  Give amiodarone 150mg  IV bolus and start on a gtt.     Intervention Category Intermediate Interventions: Arrhythmia - evaluation and management  Elsie Lincoln 11/11/2020, 11:34 PM   5:48 AM Notified of K 3.2, Mg 1.5. Pt is on HD and had episodes of hyperkalemia in the past requiring lokelma.  Plan> Will replete 76meq KCl only and replete Mg.

## 2020-11-11 NOTE — Progress Notes (Signed)
eLink Physician-Brief Progress Note Patient Name: Candace [name] DOB: [birth year] MRN: [id number]   Date of Service  11/11/2020  HPI/Events of Note  Anemia - Hgb = 6.7.  eICU Interventions  Transfuse 1 unit PRBC.      Intervention Category Major Interventions: Other:  Lysle Dingwall 11/11/2020, 4:34 AM

## 2020-11-11 NOTE — Progress Notes (Signed)
CRITICAL VALUE ALERT  Critical Value:  Hemoglobin 6.7  Date & Time Notied:  11/11/2020 0427  Provider Notified: E-link notified.  Orders Received/Actions taken: Orders given to transfuse 1 unit PRBC.   Antonieta Pert, RN  11/11/2020 0430

## 2020-11-12 DIAGNOSIS — J9621 Acute and chronic respiratory failure with hypoxia: Secondary | ICD-10-CM | POA: Diagnosis not present

## 2020-11-12 DIAGNOSIS — U071 COVID-19: Secondary | ICD-10-CM | POA: Diagnosis not present

## 2020-11-12 DIAGNOSIS — J9600 Acute respiratory failure, unspecified whether with hypoxia or hypercapnia: Secondary | ICD-10-CM | POA: Diagnosis not present

## 2020-11-12 DIAGNOSIS — I4891 Unspecified atrial fibrillation: Secondary | ICD-10-CM | POA: Diagnosis not present

## 2020-11-12 LAB — GLUCOSE, CAPILLARY
Glucose-Capillary: 104 mg/dL — ABNORMAL HIGH (ref 70–99)
Glucose-Capillary: 105 mg/dL — ABNORMAL HIGH (ref 70–99)
Glucose-Capillary: 123 mg/dL — ABNORMAL HIGH (ref 70–99)
Glucose-Capillary: 123 mg/dL — ABNORMAL HIGH (ref 70–99)
Glucose-Capillary: 130 mg/dL — ABNORMAL HIGH (ref 70–99)
Glucose-Capillary: 84 mg/dL (ref 70–99)
Glucose-Capillary: 85 mg/dL (ref 70–99)

## 2020-11-12 LAB — COMPREHENSIVE METABOLIC PANEL
ALT: 19 U/L (ref 0–44)
AST: 26 U/L (ref 15–41)
Albumin: 2.7 g/dL — ABNORMAL LOW (ref 3.5–5.0)
Alkaline Phosphatase: 156 U/L — ABNORMAL HIGH (ref 38–126)
Anion gap: 12 (ref 5–15)
BUN: 31 mg/dL — ABNORMAL HIGH (ref 8–23)
CO2: 24 mmol/L (ref 22–32)
Calcium: 8.4 mg/dL — ABNORMAL LOW (ref 8.9–10.3)
Chloride: 100 mmol/L (ref 98–111)
Creatinine, Ser: 0.6 mg/dL (ref 0.44–1.00)
GFR, Estimated: >60 mL/min (ref >60)
Glucose, Bld: 100 mg/dL — ABNORMAL HIGH (ref 70–99)
Potassium: 2.9 mmol/L — ABNORMAL LOW (ref 3.5–5.1)
Sodium: 136 mmol/L (ref 135–145)
Total Bilirubin: 0.5 mg/dL (ref 0.3–1.2)
Total Protein: 8 g/dL (ref 6.5–8.1)

## 2020-11-12 LAB — PHOSPHORUS
Phosphorus: 2.8 mg/dL (ref 2.5–4.6)
Phosphorus: 4 mg/dL (ref 2.5–4.6)

## 2020-11-12 LAB — BASIC METABOLIC PANEL
Anion gap: 12 (ref 5–15)
Anion gap: 12 (ref 5–15)
BUN: 39 mg/dL — ABNORMAL HIGH (ref 8–23)
BUN: 59 mg/dL — ABNORMAL HIGH (ref 8–23)
CO2: 24 mmol/L (ref 22–32)
CO2: 24 mmol/L (ref 22–32)
Calcium: 8.9 mg/dL (ref 8.9–10.3)
Calcium: 9.7 mg/dL (ref 8.9–10.3)
Chloride: 98 mmol/L (ref 98–111)
Chloride: 100 mmol/L (ref 98–111)
Creatinine, Ser: 0.76 mg/dL (ref 0.44–1.00)
Creatinine, Ser: 1.12 mg/dL — ABNORMAL HIGH (ref 0.44–1.00)
GFR, Estimated: >60 mL/min (ref >60)
GFR, Estimated: 53 mL/min — ABNORMAL LOW (ref >60)
Glucose, Bld: 92 mg/dL (ref 70–99)
Glucose, Bld: 140 mg/dL — ABNORMAL HIGH (ref 70–99)
Potassium: 3.2 mmol/L — ABNORMAL LOW (ref 3.5–5.1)
Potassium: 4.2 mmol/L (ref 3.5–5.1)
Sodium: 134 mmol/L — ABNORMAL LOW (ref 135–145)
Sodium: 136 mmol/L (ref 135–145)

## 2020-11-12 LAB — CBC
HCT: 27.8 % — ABNORMAL LOW (ref 36.0–46.0)
HCT: 27.5 % — ABNORMAL LOW (ref 36.0–46.0)
Hemoglobin: 8.6 g/dL — ABNORMAL LOW (ref 12.0–15.0)
Hemoglobin: 8.5 g/dL — ABNORMAL LOW (ref 12.0–15.0)
MCH: 27.6 pg (ref 26.0–34.0)
MCH: 27.7 pg (ref 26.0–34.0)
MCHC: 30.9 g/dL (ref 30.0–36.0)
MCHC: 30.9 g/dL (ref 30.0–36.0)
MCV: 89.1 fL (ref 80.0–100.0)
MCV: 89.6 fL (ref 80.0–100.0)
Platelets: 291 10*3/uL (ref 150–400)
Platelets: 294 10*3/uL (ref 150–400)
RBC: 3.12 MIL/uL — ABNORMAL LOW (ref 3.87–5.11)
RBC: 3.07 MIL/uL — ABNORMAL LOW (ref 3.87–5.11)
RDW: 20 % — ABNORMAL HIGH (ref 11.5–15.5)
RDW: 19.9 % — ABNORMAL HIGH (ref 11.5–15.5)
WBC: 13.8 10*3/uL — ABNORMAL HIGH (ref 4.0–10.5)
WBC: 13.6 10*3/uL — ABNORMAL HIGH (ref 4.0–10.5)
nRBC: 0.3 % — ABNORMAL HIGH (ref 0.0–0.2)
nRBC: 0.4 % — ABNORMAL HIGH (ref 0.0–0.2)

## 2020-11-12 LAB — BPAM RBC
Blood Product Expiration Date: 202201282359
ISSUE DATE / TIME: 202201131140
Unit Type and Rh: 7300

## 2020-11-12 LAB — TYPE AND SCREEN
ABO/RH(D): B POS
Antibody Screen: NEGATIVE
Unit division: 0

## 2020-11-12 LAB — MAGNESIUM
Magnesium: 1.8 mg/dL (ref 1.7–2.4)
Magnesium: 1.5 mg/dL — ABNORMAL LOW (ref 1.7–2.4)

## 2020-11-12 MED ORDER — SODIUM CHLORIDE 0.9 % IV BOLUS
250.0000 mL | Freq: Once | INTRAVENOUS | Status: AC
  Administered 2020-11-12: 250 mL via INTRAVENOUS

## 2020-11-12 MED ORDER — METOPROLOL TARTRATE 25 MG/10 ML ORAL SUSPENSION
12.5000 mg | Freq: Once | ORAL | Status: AC
  Administered 2020-11-12: 12.5 mg
  Filled 2020-11-12: qty 10

## 2020-11-12 MED ORDER — METOPROLOL TARTRATE 5 MG/5ML IV SOLN
2.5000 mg | INTRAVENOUS | Status: DC | PRN
  Administered 2020-11-12: 2.5 mg via INTRAVENOUS
  Administered 2020-11-13 (×2): 5 mg via INTRAVENOUS
  Administered 2020-11-13 (×2): 2.5 mg via INTRAVENOUS
  Administered 2020-11-13 – 2020-11-14 (×4): 5 mg via INTRAVENOUS
  Administered 2020-11-14: 03:00:00 2.5 mg via INTRAVENOUS
  Administered 2020-11-15: 03:00:00 5 mg via INTRAVENOUS
  Administered 2020-11-16 (×2): 2.5 mg via INTRAVENOUS
  Administered 2020-11-18: 5 mg via INTRAVENOUS
  Administered 2020-11-30 (×2): 2.5 mg via INTRAVENOUS
  Filled 2020-11-12 (×15): qty 5

## 2020-11-12 MED ORDER — POTASSIUM CHLORIDE 20 MEQ PO PACK
20.0000 meq | PACK | Freq: Once | ORAL | Status: AC
  Administered 2020-11-12: 20 meq
  Filled 2020-11-12: qty 1

## 2020-11-12 MED ORDER — INSULIN GLARGINE 100 UNIT/ML ~~LOC~~ SOLN
20.0000 [IU] | Freq: Two times a day (BID) | SUBCUTANEOUS | Status: DC
  Administered 2020-11-12 – 2020-11-17 (×4): 20 [IU] via SUBCUTANEOUS
  Filled 2020-11-12 (×13): qty 0.2

## 2020-11-12 MED ORDER — ALTEPLASE 2 MG IJ SOLR
2.0000 mg | Freq: Once | INTRAMUSCULAR | Status: DC
  Filled 2020-11-12: qty 2

## 2020-11-12 MED ORDER — MAGNESIUM SULFATE IN D5W 1-5 GM/100ML-% IV SOLN
1.0000 g | Freq: Once | INTRAVENOUS | Status: AC
  Administered 2020-11-12: 1 g via INTRAVENOUS
  Filled 2020-11-12: qty 100

## 2020-11-12 NOTE — Progress Notes (Addendum)
Candace [name] Progress Note   Assessment/ Plan:   # AKI- oliguric now likely anuric. ATN for shock.Started on CRRT 09/08/20 due to persistent oliguria/anuria and hyperkalemia.[place] placed on 10/19/20. -Patient has previously failed multiple transitions to HD w/ low Bps requiring pressors -No signs of meaningful recovery from a kidney perspective -Family expressed ongoing wishes for aggressive care -Ethics seen 12/30 and suggested time trial of 5-7 days CV while finishing treatment for PNA - CRRT stopped 11/03/20 at 1700 -HD with UF profile + albumin x 2+ midodrine -there may be a possibility of renal recovery? Doubtful although will watch over the weekend and see what her RFP and urine output does. Due for HD tomorrow, but if labs and volume status are looking good, then can tentatively plan for HD on Monday -moving forward, if she does not tolerate any ultrafiltration with HD then renal replacement therapy in general (including CRRT) will be futile.  -Would recommend against re-initiation of CRRT at that point given futility and would need to discuss with the family what the next steps will be (which would be hospice at that point) -high BUN: possibly secondary to catabolic state/severe illness?  #SVT: trial w/ fluid bolus. amio  #Hyperkalemia: 2/2 renal failure.  Status post Lokelma with improvement.  Dialysis as above.  Could consider lokelma in between treatments if hyperK does develop.  # h/o hemorrhagic shock/ ABLA- ongoing blood lossTransfuse as needed, have added ESA.  1u prbc today  # Septic shock: still requiring low-dose pressors, over max dose of midodrine, on meropenem empirically.  Mgmt per primary  # Perforated duodenal ulcer- s/p exploratory lap and Graham patch placement per surgery.  # Acute hypoxic/hypercapnic respiratory failure due to ARDS from Recent Covid-19 PNA- currently on vent via trach per PCCM. Weaning efforts have been slow progress.  #  Atrial fibrillation- on A/C per primary svc  # Severe protein malnutrition- per primary svc.   #Hypercalcemia: immobility and CKD contributing, PTH 200 and Vit D normal. Calcium okay today. Could consider Sensipar if continued issue.   # Disposition- poor overall prognosis.    Subjective:   Did well w/ HD yesterday, net uf 1L. Now in SVT. A little more urine output which was unexpected. rec'ing 250cc bolus at the time of my encounter. Remains off pressors. Discussed w/ daughter at the bedside Objective:   BP (!) 116/58   Pulse (!) 130   Temp 100.3 F (37.9 C) (Axillary)   Resp (!) 23   Ht _0  (1.727 m)   Wt 86.5 kg   SpO2 99%   BMI 29.00 kg/m   Physical Exam: Gen: lying in bed, chronically ill appearing HEENT: trach in place CVS: tachy Resp: unlabored, trach Abd: soft, nontender Ext: edema bilateral UE's ACCESS: R IJ [facility]  Labs: BMET Recent Labs  Lab 11/09/20 1746 11/10/20 0559 11/10/20 1950 11/11/20 0351 11/11/20 1743 11/12/20 0124 11/12/20 0352  NA 134* 133* 133* 135 137 136 134*  K 3.1* 3.4* 3.7 3.8 4.0 2.9* 3.2*  CL 99 97* 100 101 100 100 98  CO2 23 21* 21* 20* 19* 24 24  GLUCOSE 133* 160* 204* 189* 97 100* 92  BUN 66* 79* 104* 112* 121* 31* 39*  CREATININE 0.87 1.16* 1.27* 1.40* 1.55* 0.60 0.76  CALCIUM 8.7* 8.7* 8.7* 8.9 9.2 8.4* 8.9  PHOS  --   --   --  8.8*  --  2.8 4.0   CBC Recent Labs  Lab 11/10/20 0559 11/11/20 0351 11/12/20 0124  11/12/20 0352  WBC 10.3 10.8* 13.8* 13.6*  NEUTROABS  --  7.1  --   --   HGB 7.0* 6.7* 8.6* 8.5*  HCT 22.5* 21.6* 27.8* 27.5*  MCV 93.8 95.2 89.1 89.6  PLT 256 266 291 294      Medications:    . sodium chloride   Intravenous Once  . apixaban  2.5 mg Per Tube BID  . B-complex with vitamin C  1 tablet Per Tube Daily  . chlorhexidine gluconate (MEDLINE KIT)  15 mL Mouth Rinse BID  . Chlorhexidine Gluconate Cloth  6 each Topical Daily  . cholecalciferol  2,000 Units Per Tube Daily  . clonazepam  0.5 mg  Per Tube TID  . darbepoetin (ARANESP) injection - DIALYSIS  100 mcg Intravenous Q Tue-HD  . docusate  100 mg Per Tube BID  . feeding supplement (PROSource TF)  90 mL Per Tube TID  . fentaNYL  1 patch Transdermal Q72H  . guaiFENesin  15 mL Per Tube Q12H  .  HYDROmorphone (DILAUDID) injection  1 mg Intravenous Q6H  . influenza vaccine adjuvanted  0.5 mL Intramuscular Tomorrow-1000  . insulin aspart  0-15 Units Subcutaneous Q4H  . insulin aspart  4 Units Subcutaneous Q4H  . insulin glargine  20 Units Subcutaneous BID  . mouth rinse  15 mL Mouth Rinse QID  . midodrine  40 mg Per Tube TID WC  . nutrition supplement (JUVEN)  1 packet Per Tube BID BM  . pantoprazole sodium  40 mg Per Tube BID  . polyethylene glycol  17 g Per Tube Daily  . QUEtiapine  100 mg Per Tube QHS  . QUEtiapine  50 mg Per Tube Daily  . sodium chloride flush  10-40 mL Intracatheter Q12H    Saba Gomm  11/12/2020, 2:12 PM

## 2020-11-12 NOTE — Progress Notes (Signed)
Assisted tele visit to patient with daughter.  Candace [name], Candace Mccormack Renee, RN   

## 2020-11-12 NOTE — Progress Notes (Signed)
eLink Physician-Brief Progress Note Patient Name: Candace [name] DOB: 07-Oct-1951 MRN: [id number]   Date of Service  11/12/2020  HPI/Events of Note  Heart rate 130-140's.  eICU Interventions  PRN Lopressor iv ordered.        Kerry Kass Garrus Gauthreaux 11/12/2020, 10:59 PM

## 2020-11-12 NOTE — Progress Notes (Signed)
[name], MRN:  [id number], DOB:  Oct 02, 1951, LOS: 24 ADMISSION DATE:  08/31/2020, CONSULTATION DATE:  09/07/2020 REFERRING MD:  Dr Candiss Norse, CHIEF COMPLAINT:  Acute resp failure  Brief History   70 year old female who was previously diagnosed with Covid 08/23/2020.  Admitted 11/2 with AF-RVR, found to have a perforated duodenal underwent exploratory laparotomy with Phillip Heal patch placement 11/3.  Past Medical History  Covid pneumonia Atrial fibrillation CKD stage III Diabetes mellitus Hypertension Colon cancer Hyperlipidemia  Significant Hospital Events   11/2 Admitted  11/3 OR with findings of perforated duodenal ulcer 11/10 progressive hemorrhagic shock, intubated, transfused, pressors, proned; started on CRRT in PM 11/03 Exploratory laparotomy, Phillip Heal patch, lysis of adhesion for duodenal ulceration postop day 6 11/16 Extubated. Re-intubated overnight due to respiratory distress and hypoxia with decreased mentation 11/18 Bronch, cultures sent 11/19 Hgb down getting blood 11/24 Spiked fever resume empirical antimicrobial therapy 11/26 Hemorrhagic shock, hgb 5.6, increased pressors, CT A/P  11/30 Per palliative "[name] expresses understanding that patient is unfortunately very fragile despite ongoing intensive medical treatment and full mechanical support. She indicates that the family wants to continue with all current interventions despite potential outcomes". 12/08 CRRT discontinued due to clotting 12/09 Family requested transfer to tertiary care [facility] denied transfer  12/10 CRRT restarted. Episodes of tachycardia, tachypnea that seem to improve with pain management 12/11 Back in shock. Pressor requirements up. CXR worse. ABX resumed 12/12 Still requiring inc pressors. Had hypoglycemic event. Basal insulin dosing adjusted 12/13 Pressor requirements better. Now hyperglycemic. Re-adjusted Glycemic control  12/14 Changed dilaudid to1/2 dosing from day further. D/c  vasopressin.  Goals of care reconfirmed with daughter.  Patient continues to desire aggressive care.  Not open to discussing any other option, patient family continues to be hopeful that she will be discharged to home with full recovery in spite of multiple attempts by staff to prepare them that this is unlikely scenario 12/15 Dilaudid discontinued sending cortisol for ongoing pressor dependence 12/16 Serum cortisol <20, added stress dose steroids.  PRN Dilaudid, attempting not go back on Dilaudid infusion 12/17 Developed worsening tachycardia during the evening hours received initially IV albumin, followed by resuming IV Dilaudid with question of suboptimal pain control.  Currently looks better back on Dilaudid drip.  We have been able to wean pressors after adding stress dose steroids; near arrest - bradycardia, better with atropine  12/19 Afebrile . Remains on dilaudid and heparin gtt, dilaudid gtt increased overnight for concern of pain / ongoing tachycardia, no other events . NE and precedex off 12/17. Ongoing CRRT- even UF, dosing lokelmia this morning 12/20 On CRRT.  Renal plans for HD cath and moving to HD. Getting wound care  12/23 -No vent weaning per RT, remains on full support, #8 trach in place but previously had #6, no documentation of change noted in chart Afebrile / WBC 20.4  Vent - 30% FiO2, PEEP 5 Glucose range 176-212 I/O 465 ml stool, 3.6L removed with HD, -1.1L in last 24 hours  RN reports ongoing periods of tachypnea / vent dyssynchrony that responds to dilaudid   12/24 - renal stopping CRRT today and plans HD 10/24/20 . 40% fio2 on vent via Trach. TAchypenic and tachycardic. Afebrile but wbc up to 27.6K. On TF. Onn heparin gtt  12/25 - Back on CRRT. On vent via trach at 40% fio2, On scheduled dilaudid as add on to oxy. Per family request 12/24 - they felt scheduled oxy was not adequate and patient was showing  signs of opioid withdrawal.  Patient also had  worsening SIRS/sepsis  syndrome. Had fever, rising wbc, worsening RR 40-60 and HR 140s sinus-> started On abx yesterday. Fever some better today. WBC plateau at 28,.5K. On new levophed -> signifanct improvement in HR 77 and RR t0 20. On heparin gtt.  On precedex gtt. On levophed gtt 17mcg wthi midodrine. On TF 12/30 Remains on CRRT with intermittent pressor requirements. Ethics consult placed evening of 12/29. Ethics rec time trial of CRRT 12/31 failed SBT with RR 40s. Several conversations between care tam and family, who are upset RE plan of care 1/1 back on pressors  1/4: On pressors, keeping even on CVVHD, HGB drop to 6.9, transfused 1 unit. Improving mental status 1/10 remains on low dose levophed 40mcg 1/11 off levo, attempting HD with UF for first time.   Consults:  Cardiology [facility] Nephrology  Ethics  Procedures:  R PICC 11/5 >> A line 11/9 >> out ETT 11/9 > 11/16, 11/16 >> 09/21/2020, 09/21/2020 tracheostomy>> Lt [place] CVL 11/9 >> R IJ trialysis >> out HD catheter 12/1 >>12/20 12/21 - 14.5 Fr, 23 cm right IJ tunneled hemodialysis catheter placement.  Removal of indwelling subclavian catheter.   Significant Diagnostic Tests:  11/3 CT abd/ pelvis > 1. Positive for bowel perforation: Pneumoperitoneum and intermediate density free fluid in the abdomen. Prior total colectomy. The specific site of perforation is unclear-oral contrast present to the proximal jejunum has not obviously leaked. Note that there may be small bowel loops adherent to the ventral abdominal wall along the greater curve of the stomach. 2. Extensive bilateral lower lung pneumonia. No pleural effusion. 3. Other abdominal and pelvic viscera are stable since 2015, including bilateral adrenal adenomas. Chronic renal parapelvic cysts. 4. Aortic Atherosclerosis 11/3 TTE > EF 70-75%, RV not well visualized, mildly reduced RV systolic function 16/10 CT chest/ abd/ pelvis> 1. Interval progression of diffuse bilateral hazy ground-glass airspace  opacities with more focal areas of consolidation at the lung bases 2. Trace bilateral pleural effusions. 3. Postsurgical changes the abdomen as detailed above. No evidence for a postoperative abscess, however evaluation is limited by lack of IV contrast. 4. There is a 1.9 cm cystic appearing lesion located in the pancreatic body. This was not present on the patient's CT from 2015.  Follow-up with an outpatient contrast enhanced MRI is recommended. 5. The endometrial stripe appears diffusely thickened. Follow-up with pelvic ultrasound is recommended. Aortic Atherosclerosis 11/14 LE doppler studies > + DVT of right posterior tibial and peroneal vein, +dVT of left posterior tibial vein  11/26 CT ABD PEL > liver unremarkable, distended gallbladder with layering tiny gallstones versus sludge, no duct dilatation, mild hyperdensity right upper pole renal collecting system new.  No evidence of retroperitoneal bleeding.  Multifocal lower lobe predominant pulmonary infiltrates/pneumonia.  Small left pleural effusion.  Micro Data:  11/10 MRSA PCR > neg 11/10 BC x 2 > neg 09/22/2020 blood cultures x2>> S epi.  09/22/2020 sputum culture>> MRSA Blood 12/1 >> negative. S.epi 1 out of 2, likely contaminant BCx 2 12/12 >> neg xxx Trach aspirate 12/24 - FEW STAPHYLOCOCCUS AUREUS  Blood 12/24 > Negative  Blood 1/2: neg Sputum 1/4: MSSA  Antimicrobials:  azithro 11/2 >11/3 Ceftriaxone 11/2  Fluconazole 11/3 Zosyn 11/3 >> 11/7 Vanc 11/10 off Cefepime 11/10 > 11/16 09/22/2020 vancomycin for MRSA PNA >> 12/2 xxxx Vanc 12/11> 12/17 Zosyn 12/11> 12/17 xxxx vanc 12/24 (staph resp cutlure) >> 12/28 Zosyn 12/24 - 12/27 Cefazolin 12/27 >>1/1 linezolid 1/2 eraxis  1/2  Meropenem 1/2 > 1/8  Subjective:  Developed SVT during the night.  Placed on amiodarone drip.  Will turn drip off and give 250 cc of normal saline at this time.  Objective   Blood pressure 124/60, pulse (!) 138, temperature 99.1 F (37.3  C), temperature source Oral, resp. rate (!) 28, height 5\' 8"  (1.727 m), weight 86.5 kg, SpO2 98 %.    Vent Mode: PRVC FiO2 (%):  [28 %-40 %] 40 % Set Rate:  [28 bmp] 28 bmp Vt Set:  [450 mL] 450 mL PEEP:  [5 cmH20] 5 cmH20 Plateau Pressure:  [20 cmH20] 20 cmH20   Intake/Output Summary (Last 24 hours) at 11/12/2020 0814 Last data filed at 11/12/2020 0700 Gross per 24 hour  Intake 1917.58 ml  Output 1650 ml  Net 267.58 ml   Filed Weights   11/07/20 1045 11/09/20 0645 11/09/20 0949  Weight: 82.7 kg 87 kg 86.5 kg   Physical Exam  General: Frail female who was interactive HEENT: Tracheostomy is in place currently on full mechanical ventilatory support Neuro: Extremely weak but does follow simple commands CV: Tachycardia 130 on amnio drip PULM: Decreased breath sounds in the bases Vent pressure regulated volume control FIO2 40% PEEP 5 RATE 28 VT 450  GI: soft, bsx4 active  GU: Extremities: warm/dry,  edema  Skin: no rashes or lesions  .   Resolved problems:  Previously hemorrhagic shock requiring PRBCs, Septic shock, MRSA pneumonia s/p 8 days abx Complicated by MRSA healthcare associated pneumonia (treated) Peritonitis and perforated ulcer post repair 09/01/20 MSSA HAP  Assessment:  Candace [name] is a 70 y.o. with history of COVID 19 infection requiring hospitalization in October 2021 with current admission for hemorrhagic shock secondary to perforated gastric ulcer requiring surgical intervention and [place]. Her medical issues are as follows:  Acute metabolic encephalopathy : Prolonged critical illness, sedation/pain Slowly improving Now off Precedex as of 11/11/2020 Continue Klonopin Dilaudid fentanyl patch and Seroquel Slow titration to off in the future            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 status post tracheostomy Stable bilateral pneumonia VDRF Wean per protocol Tolerating trach  collar for 2 to 3 hours at a time  Persistent vasoplegia vs circulatory shock.  Persistent SVT Remains off vasopressors Continue high-dose midodrine Tolerate lower blood pressure Attempt to wean off amiodarone 250 cc bolus normal saline for suspected volume under load   Renal failure Lab Results  Component Value Date   CREATININE 0.76 11/12/2020   CREATININE 0.60 11/12/2020   CREATININE 1.55 (H) 11/11/2020   Appreciate nephrology's input She is making urine May be time to stop intermittent hemodialysis as she developed SVT during the night        Afib/DVT Recent Labs    11/12/20 0124 11/12/20 0352  HGB 8.6* 8.5*    Anemia No obvious bleeding, suspect nosocomial losses with chronic disease Transfuse per protocol [place] started on 11/11/2020  Critical Illness Myopathy Severe deconditioning  Inadequate PO intake  Physical therapy nutrition as tolerated  DM2 with hyperglycemia CBG (last 3)  Recent Labs    11/12/20 0023 11/12/20 0351 11/12/20 0734  GLUCAP 104* 84 85    Continue sliding-scale insulin protocol  Stage IV sacral ulcer Continue current level of care as scribed by wound ostomy service  History of stage IV colon cancer 1.9 cm cystic-appearing lesion located in pancreatic body no intervention at this time.  Will need follow-up  as an outpatient  [place] -multiple [place] conversations with various PCCM providers and family have not yielded changes in scope of tx, multiple PCCM providers have been fired by family.  -concerns for futility of certain interventions -ethics consulted 12/30-- recs for time limited trial of CRRT, please see separate Ethics consult note for full details. Futility policy reviewed with family by ethics. Ongoing meetings with Dr. Hulen Skains with nursing administration and family. The ultimate goal is to transition her safely to HD from CRRT which has proven difficult thus far.  Note interim hemodialysis pulled 0.5 L.  Currently remains  off CRRT. 11/11/2020 remain off CRRT.  Making some urine on her own.  Continuing intermittent hemodialysis for negative draw 09/12/2021 status post intermittent hemodialysis from 11/11/2020 with negative drawl developed SVT during the night was placed on amnio drip.  Best practice:  Diet: EN  Pain/Anxiety/Delirium protocol (if indicated): Precedex was discontinued 11/11/2020 continues to have fentanyl patch and Dilaudid as needed VAP protocol (if indicated): yes DVT prophylaxis: IV heparin GI prophylaxis: Protonix Glucose control: SSI, TF coverage, lantus Mobility: PT Family communication: Family updates daily per MD  App cct 17 min  Richardson Landry Dasja Brase ACNP Acute Care Nurse Practitioner [place] Please consult [place] 11/12/2020, 8:14 AM

## 2020-11-12 NOTE — Progress Notes (Signed)
Nutrition Follow-up  DOCUMENTATION CODES:   Obesity unspecified  INTERVENTION:   Tube Feeding via Cortrak:  Vital 1.5 at 65 ml/h (1560 ml) Pro-source TF 90 mL TID Provides 171g of protein, 2580kcals and 1094 mL of free water  Continue Juven BID, each packet provides 80 calories, 8 grams of carbohydrate, 2.5  grams of protein (collagen), 7 grams of L-arginine and 7 grams of L-glutamine; supplement contains CaHMB, Vitamins C, E, B12 and Zinc to promote wound healing  Continue B-complex with C   NUTRITION DIAGNOSIS:   Inadequate oral intake related to inability to eat as evidenced by NPO status.  Being addressed via TF   GOAL:   Patient will meet greater than or equal to 90% of their needs  Progressing  MONITOR:   Vent status,TF tolerance,Labs,Weight trends  REASON FOR ASSESSMENT:   Consult New TPN/TNA  ASSESSMENT:   70 y.o. female was recently hospitalized for COVID-19 pneumonia-and discharged on 2-3 L of home O2-presents with worsening shortness of breath-found to have A. fib with RVR, worsening hypoxia. Pt developed severe upper abdominal pain-subsequent further imaging studies revealed perforated viscus. S/p laparotomy on 11/3 which showed a perforated duodenal ulcer.   Discussed patient in ICU rounds and with RN today. 1000 ml with HD yesterday. Currently on trach collar, tolerating for 2-3 hours at a time.  Pressors have all been discontinued. Patient is now producing urine.  Current TF regimen: Vital 1.5 at 65 ml/hr with Prosource TF 90 ml TID via Cortrak tube. Juven BID via tube to support wound healing.  Ileostomy with 500 mL out x 24 hours. I/O + 15 L since admission UOP 450 ml x 24 hours  No new weight since 1/11 (86.5 kg)  Patient received Copper supplement for low level of 20 on 12/16.  Receiving Vit D supplementation for low level 18.37 on 12/16.  Labs: Na 134, K 3.2, BUN 39, mag 1.5 CBGs: 83-100  Medications reviewed and include Vitamin D3,  B complex with C, aranesp, colace, miralax, novolog, lantus.  1/11 Diet Order:   Diet Order            Diet NPO time specified  Diet effective midnight                 EDUCATION NEEDS:   Not appropriate for education at this time  Skin:  Skin Assessment: Skin Integrity Issues: Skin Integrity Issues:: Stage II DTI: sacrum Stage I: n/a Stage II: ear Incisions: abdomen  Last BM:  1/11 type 7, type 5  Height:   Ht Readings from Last 1 Encounters:  09/20/20 5\' 8"  (1.727 m)    Weight:   Wt Readings from Last 1 Encounters:  11/09/20 86.5 kg   Admission weight 99.7 kg (11/2)  BMI:  Body mass index is 29 kg/m.  Estimated Nutritional Needs:   Kcal:  6803-2122 kcals  Protein:  135-175 g  Fluid:  >/= 2 L/day   Lucas Mallow, RD, LDN, CNSC Please refer to Amion for contact information.

## 2020-11-13 ENCOUNTER — Inpatient Hospital Stay (HOSPITAL_COMMUNITY): Payer: Medicare Other

## 2020-11-13 DIAGNOSIS — N186 End stage renal disease: Secondary | ICD-10-CM | POA: Diagnosis not present

## 2020-11-13 DIAGNOSIS — I4891 Unspecified atrial fibrillation: Secondary | ICD-10-CM | POA: Diagnosis not present

## 2020-11-13 DIAGNOSIS — J9621 Acute and chronic respiratory failure with hypoxia: Secondary | ICD-10-CM | POA: Diagnosis not present

## 2020-11-13 LAB — CBC WITH DIFFERENTIAL/PLATELET
Abs Immature Granulocytes: 0.09 10*3/uL — ABNORMAL HIGH (ref 0.00–0.07)
Basophils Absolute: 0.1 10*3/uL (ref 0.0–0.1)
Basophils Relative: 1 %
Eosinophils Absolute: 0.4 10*3/uL (ref 0.0–0.5)
Eosinophils Relative: 3 %
HCT: 27.3 % — ABNORMAL LOW (ref 36.0–46.0)
Hemoglobin: 8.5 g/dL — ABNORMAL LOW (ref 12.0–15.0)
Immature Granulocytes: 1 %
Lymphocytes Relative: 17 %
Lymphs Abs: 1.9 10*3/uL (ref 0.7–4.0)
MCH: 28 pg (ref 26.0–34.0)
MCHC: 31.1 g/dL (ref 30.0–36.0)
MCV: 89.8 fL (ref 80.0–100.0)
Monocytes Absolute: 1.1 10*3/uL — ABNORMAL HIGH (ref 0.1–1.0)
Monocytes Relative: 10 %
Neutro Abs: 7.8 10*3/uL — ABNORMAL HIGH (ref 1.7–7.7)
Neutrophils Relative %: 68 %
Platelets: 363 10*3/uL (ref 150–400)
RBC: 3.04 MIL/uL — ABNORMAL LOW (ref 3.87–5.11)
RDW: 19.2 % — ABNORMAL HIGH (ref 11.5–15.5)
WBC: 11.2 10*3/uL — ABNORMAL HIGH (ref 4.0–10.5)
nRBC: 0.2 % (ref 0.0–0.2)

## 2020-11-13 LAB — BASIC METABOLIC PANEL
Anion gap: 13 (ref 5–15)
Anion gap: 13 (ref 5–15)
BUN: 71 mg/dL — ABNORMAL HIGH (ref 8–23)
BUN: 94 mg/dL — ABNORMAL HIGH (ref 8–23)
CO2: 23 mmol/L (ref 22–32)
CO2: 22 mmol/L (ref 22–32)
Calcium: 10 mg/dL (ref 8.9–10.3)
Calcium: 9.9 mg/dL (ref 8.9–10.3)
Chloride: 100 mmol/L (ref 98–111)
Chloride: 103 mmol/L (ref 98–111)
Creatinine, Ser: 1.2 mg/dL — ABNORMAL HIGH (ref 0.44–1.00)
Creatinine, Ser: 1.44 mg/dL — ABNORMAL HIGH (ref 0.44–1.00)
GFR, Estimated: 49 mL/min — ABNORMAL LOW (ref >60)
GFR, Estimated: 39 mL/min — ABNORMAL LOW (ref >60)
Glucose, Bld: 84 mg/dL (ref 70–99)
Glucose, Bld: 134 mg/dL — ABNORMAL HIGH (ref 70–99)
Potassium: 3.4 mmol/L — ABNORMAL LOW (ref 3.5–5.1)
Potassium: 4 mmol/L (ref 3.5–5.1)
Sodium: 136 mmol/L (ref 135–145)
Sodium: 138 mmol/L (ref 135–145)

## 2020-11-13 LAB — MAGNESIUM: Magnesium: 1.9 mg/dL (ref 1.7–2.4)

## 2020-11-13 LAB — GLUCOSE, CAPILLARY
Glucose-Capillary: 80 mg/dL (ref 70–99)
Glucose-Capillary: 83 mg/dL (ref 70–99)
Glucose-Capillary: 112 mg/dL — ABNORMAL HIGH (ref 70–99)
Glucose-Capillary: 83 mg/dL (ref 70–99)
Glucose-Capillary: 99 mg/dL (ref 70–99)
Glucose-Capillary: 105 mg/dL — ABNORMAL HIGH (ref 70–99)

## 2020-11-13 LAB — PHOSPHORUS: Phosphorus: 7.1 mg/dL — ABNORMAL HIGH (ref 2.5–4.6)

## 2020-11-13 MED ORDER — POTASSIUM CHLORIDE 20 MEQ PO PACK
20.0000 meq | PACK | Freq: Once | ORAL | Status: AC
  Administered 2020-11-13: 20 meq
  Filled 2020-11-13: qty 1

## 2020-11-13 NOTE — Progress Notes (Signed)
Patient noted dry heaving, writer asked patient if she was naused and patient nodded her head yes. Ondansetron 4mg  administered per orders at 0230. No tube feeding infusing during this shift.

## 2020-11-13 NOTE — Progress Notes (Signed)
NAMEShonica Weier, MRN:  [id number], DOB:  01-30-1951, LOS: 72 ADMISSION DATE:  08/31/2020, CONSULTATION DATE:  09/07/2020 REFERRING MD:  Dr Candiss Norse, CHIEF COMPLAINT:  Acute resp failure  Brief History   70 year old female who was previously diagnosed with Covid 08/23/2020.  Admitted 11/2 with AF-RVR, found to have a perforated duodenal underwent exploratory laparotomy with Phillip Heal patch placement 11/3.  Past Medical History  Covid pneumonia Atrial fibrillation CKD stage III Diabetes mellitus Hypertension Colon cancer Hyperlipidemia  Significant Hospital Events   11/2 Admitted  11/3 OR with findings of perforated duodenal ulcer 11/10 progressive hemorrhagic shock, intubated, transfused, pressors, proned; started on CRRT in PM 11/03 Exploratory laparotomy, Phillip Heal patch, lysis of adhesion for duodenal ulceration postop day 6 11/16 Extubated. Re-intubated overnight due to respiratory distress and hypoxia with decreased mentation 11/18 Bronch, cultures sent 11/19 Hgb down getting blood 11/24 Spiked fever resume empirical antimicrobial therapy 11/26 Hemorrhagic shock, hgb 5.6, increased pressors, CT A/P  11/30 Per palliative "[name] expresses understanding that patient is unfortunately very fragile despite ongoing intensive medical treatment and full mechanical support. She indicates that the family wants to continue with all current interventions despite potential outcomes". 12/08 CRRT discontinued due to clotting 12/09 Family requested transfer to tertiary care [facility]). [facility] denied transfer  12/10 CRRT restarted. Episodes of tachycardia, tachypnea that seem to improve with pain management 12/11 Back in shock. Pressor requirements up. CXR worse. ABX resumed 12/12 Still requiring inc pressors. Had hypoglycemic event. Basal insulin dosing adjusted 12/13 Pressor requirements better. Now hyperglycemic. Re-adjusted Glycemic control  12/14 Changed dilaudid to1/2 dosing from day further. D/c  vasopressin.  Goals of care reconfirmed with daughter.  Patient continues to desire aggressive care.  Not open to discussing any other option, patient family continues to be hopeful that she will be discharged to home with full recovery in spite of multiple attempts by staff to prepare them that this is unlikely scenario 12/15 Dilaudid discontinued sending cortisol for ongoing pressor dependence 12/16 Serum cortisol <20, added stress dose steroids.  PRN Dilaudid, attempting not go back on Dilaudid infusion 12/17 Developed worsening tachycardia during the evening hours received initially IV albumin, followed by resuming IV Dilaudid with question of suboptimal pain control.  Currently looks better back on Dilaudid drip.  We have been able to wean pressors after adding stress dose steroids; near arrest - bradycardia, better with atropine  12/19 Afebrile . Remains on dilaudid and heparin gtt, dilaudid gtt increased overnight for concern of pain / ongoing tachycardia, no other events . NE and precedex off 12/17. Ongoing CRRT- even UF, dosing lokelmia this morning 12/20 On CRRT.  Renal plans for HD cath and moving to HD. Getting wound care  12/23 -No vent weaning per RT, remains on full support, #8 trach in place but previously had #6, no documentation of change noted in chart Afebrile / WBC 20.4  Vent - 30% FiO2, PEEP 5 Glucose range 176-212 I/O 465 ml stool, 3.6L removed with HD, -1.1L in last 24 hours  RN reports ongoing periods of tachypnea / vent dyssynchrony that responds to dilaudid   12/24 - renal stopping CRRT today and plans HD 10/24/20 . 40% fio2 on vent via Trach. TAchypenic and tachycardic. Afebrile but wbc up to 27.6K. On TF. Onn heparin gtt  12/25 - Back on CRRT. On vent via trach at 40% fio2, On scheduled dilaudid as add on to oxy. Per family request 12/24 - they felt scheduled oxy was not adequate and patient was showing  signs of opioid withdrawal.  Patient also had  worsening SIRS/sepsis  syndrome. Had fever, rising wbc, worsening RR 40-60 and HR 140s sinus-> started On abx yesterday. Fever some better today. WBC plateau at 28,.5K. On new levophed -> signifanct improvement in HR 77 and RR t0 20. On heparin gtt.  On precedex gtt. On levophed gtt 45mcg wthi midodrine. On TF 12/30 Remains on CRRT with intermittent pressor requirements. Ethics consult placed evening of 12/29. Ethics rec time trial of CRRT 12/31 failed SBT with RR 40s. Several conversations between care tam and family, who are upset RE plan of care 1/1 back on pressors  1/4: On pressors, keeping even on CVVHD, HGB drop to 6.9, transfused 1 unit. Improving mental status 1/10 remains on low dose levophed 58mcg 1/11 off levo, attempting HD with UF for first time.   Consults:  Cardiology [facility] Nephrology  Ethics  Procedures:  R PICC 11/5 >> A line 11/9 >> out ETT 11/9 > 11/16, 11/16 >> 09/21/2020, 09/21/2020 tracheostomy>> Lt [place] CVL 11/9 >> R IJ trialysis >> out HD catheter 12/1 >>12/20 12/21 - 14.5 Fr, 23 cm right IJ tunneled hemodialysis catheter placement.  Removal of indwelling subclavian catheter.   Significant Diagnostic Tests:  11/3 CT abd/ pelvis > 1. Positive for bowel perforation: Pneumoperitoneum and intermediate density free fluid in the abdomen. Prior total colectomy. The specific site of perforation is unclear-oral contrast present to the proximal jejunum has not obviously leaked. Note that there may be small bowel loops adherent to the ventral abdominal wall along the greater curve of the stomach. 2. Extensive bilateral lower lung pneumonia. No pleural effusion. 3. Other abdominal and pelvic viscera are stable since 2015, including bilateral adrenal adenomas. Chronic renal parapelvic cysts. 4. Aortic Atherosclerosis 11/3 TTE > EF 70-75%, RV not well visualized, mildly reduced RV systolic function 81/82 CT chest/ abd/ pelvis> 1. Interval progression of diffuse bilateral hazy ground-glass airspace  opacities with more focal areas of consolidation at the lung bases 2. Trace bilateral pleural effusions. 3. Postsurgical changes the abdomen as detailed above. No evidence for a postoperative abscess, however evaluation is limited by lack of IV contrast. 4. There is a 1.9 cm cystic appearing lesion located in the pancreatic body. This was not present on the patient's CT from 2015.  Follow-up with an outpatient contrast enhanced MRI is recommended. 5. The endometrial stripe appears diffusely thickened. Follow-up with pelvic ultrasound is recommended. Aortic Atherosclerosis 11/14 LE doppler studies > + DVT of right posterior tibial and peroneal vein, +dVT of left posterior tibial vein  11/26 CT ABD PEL > liver unremarkable, distended gallbladder with layering tiny gallstones versus sludge, no duct dilatation, mild hyperdensity right upper pole renal collecting system new.  No evidence of retroperitoneal bleeding.  Multifocal lower lobe predominant pulmonary infiltrates/pneumonia.  Small left pleural effusion.  Micro Data:  11/10 MRSA PCR > neg 11/10 BC x 2 > neg 09/22/2020 blood cultures x2>> S epi.  09/22/2020 sputum culture>> MRSA Blood 12/1 >> negative. S.epi 1 out of 2, likely contaminant BCx 2 12/12 >> neg xxx Trach aspirate 12/24 - FEW STAPHYLOCOCCUS AUREUS  Blood 12/24 > Negative  Blood 1/2: neg Sputum 1/4: MSSA  Antimicrobials:  azithro 11/2 >11/3 Ceftriaxone 11/2  Fluconazole 11/3 Zosyn 11/3 >> 11/7 Vanc 11/10 off Cefepime 11/10 > 11/16 09/22/2020 vancomycin for MRSA PNA >> 12/2 xxxx Vanc 12/11> 12/17 Zosyn 12/11> 12/17 xxxx vanc 12/24 (staph resp cutlure) >> 12/28 Zosyn 12/24 - 12/27 Cefazolin 12/27 >>1/1 linezolid 1/2 eraxis  1/2  Meropenem 1/2 > 1/8  Subjective:   Tolerated dose metoprolol yesterday, BP stable Amiodarone off Break from iHD  Objective   Blood pressure 132/72, pulse (!) 122, temperature 99.6 F (37.6 C), temperature source Axillary, resp. rate  (!) 28, height 5\' 8"  (1.727 m), weight 86.5 kg, SpO2 100 %.    Vent Mode: PRVC FiO2 (%):  [30 %-40 %] 30 % Set Rate:  [28 bmp] 28 bmp Vt Set:  [450 mL] 450 mL PEEP:  [5 cmH20] 5 cmH20 Plateau Pressure:  [20 cmH20-24 cmH20] 22 cmH20   Intake/Output Summary (Last 24 hours) at 11/13/2020 1008 Last data filed at 11/13/2020 0900 Gross per 24 hour  Intake 491.35 ml  Output 525 ml  Net -33.65 ml   Filed Weights   11/07/20 1045 11/09/20 0645 11/09/20 0949  Weight: 82.7 kg 87 kg 86.5 kg   Physical Exam  General: Ill-appearing woman, mechanically ventilated HEENT: Tracheostomy in place, CDI, oropharynx moist Neuro: Awake, nods to questions, attempting to speak but unable to understand, globally weak CV: Regular, tachycardic 125, distant, no murmur PULM: Decreased bilateral breath sounds, no crackles, no wheezes GI: Nondistended, positive bowel sounds Extremities: No edema Skin: Sacral wound not examined  .   Resolved problems:  Previously hemorrhagic shock requiring PRBCs, Septic shock, MRSA pneumonia s/p 8 days abx Complicated by MRSA healthcare associated pneumonia (treated) Peritonitis and perforated ulcer post repair 09/01/20 MSSA HAP  Assessment:  Candace [name] is a 70 y.o. with history of COVID 19 infection requiring hospitalization in October 2021 with current admission for hemorrhagic shock secondary to perforated gastric ulcer requiring surgical intervention and [place]. Her medical issues are as follows:  Acute metabolic encephalopathy : Prolonged critical illness, sedation/pain Slowly improving -Continue off Precedex (stopped on 1/13) -Clonazepam, Dilaudid, fentanyl patch, Seroquel as ordered.  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  Persistent SVT, sinus tachycardia -Remains off vasopressors -Continue high-dose midodrine -Amiodarone discontinued -Metoprolol IV ordered as needed for tachycardia.  Could consider adding scheduled enteral metoprolol but will be very conservative given her history of hemodynamic instability   Renal failure -Appreciate nephrology management -Continue to follow urine output.  Plan for Foley placement 1/15 to measure accurately -Goal stay off intermittent HD through the weekend if no urgent indication to assess any evidence of renal recovery   Afib/DVT -Continue Eliquis  Anemia No obvious bleeding, suspect nosocomial losses with chronic disease -Hemoglobin goal 7.0, transfuse if indicated -Iron supplementation 1/13  Critical Illness Myopathy Severe deconditioning  Inadequate PO intake  -Push nutrition -Push physical therapy as she can tolerate  DM2 with hyperglycemia -Sliding scale insulin -NovoLog 4 units every 4 hours scheduled -Lantus 20 units twice daily  Stage IV sacral ulcer -Appreciate WOC consultation -Continue wound care as ordered  History of stage IV colon cancer 1.9 cm cystic-appearing lesion located in pancreatic body no intervention at this time. -Would need follow-up as an outpatient  [place] -multiple [place] conversations with various PCCM providers and family have not yielded changes in scope of tx, multiple PCCM providers have been fired by family.  -concerns for futility of certain interventions -ethics consulted 12/30-- recs for time limited trial of CRRT, please see separate Ethics consult note for full details. Futility policy reviewed with family by ethics. Ongoing meetings  with Dr. Hulen Skains with nursing administration and family. The ultimate goal is to transition her safely to HD from CRRT.  Best practice:  Diet: EN  Pain/Anxiety/Delirium protocol (if indicated): Precedex was discontinued 11/11/2020 continues to have  fentanyl patch and Dilaudid as needed VAP protocol (if indicated): yes DVT prophylaxis: Eliquis GI prophylaxis: Protonix Glucose control: SSI, TF coverage, lantus Mobility: PT Family communication: Family updates daily per MD  Independent CC time 79 minutes  Baltazar Apo, MD, PhD 11/13/2020, 10:18 AM [facility] [phone] or if no answer [phone]

## 2020-11-13 NOTE — Progress Notes (Incomplete)
Patient Lines/Drains/Airways Status    Active Line/Drains/Airways    Name Placement date Placement time Site Days   PICC Triple Lumen 65/78/46 Right Basilic 96/29/52  8413  - 50   Hemodialysis Catheter Right Internal jugular Double lumen Permanent (Tunneled) 10/19/20  0927  Internal jugular  25   Colostomy RLQ -  -  RLQ  -   External Urinary Catheter 11/07/20  1030  -  6   Incision (Closed) 09/01/20 Abdomen 09/01/20  1737  - 73   Nasoenteric Feeding Tube Cortrak - 43 inches 10 Fr. Left nare Documented cm marking at nare/ corner of mouth 69 cm 09/10/20  1637  Left nare  64   Tracheostomy [place] Flexible 8 mm Cuffed -  -  8 mm  -   Pressure Injury 09/17/20 Ear Left;Anterior;Posterior Stage 2 -  Partial thickness loss of dermis presenting as a shallow open injury with a red, pink wound bed without slough. 09/17/20  0800  - 57   Pressure Injury 09/25/20 Sacrum Bilateral;Medial Deep Tissue Pressure Injury - Purple or maroon localized area of discolored intact skin or blood-filled blister due to damage of underlying soft tissue from pressure and/or shear. Purple, non-blanchable, b 09/25/20  2000  - 49

## 2020-11-13 NOTE — Progress Notes (Signed)
[name] Progress Note   Assessment/ Plan:   # AKI- oliguric now likely anuric. ATN for shock.Started on CRRT 09/08/20 due to persistent oliguria/anuria and hyperkalemia.[name] placed on 10/19/20. -Patient has previously failed multiple transitions to HD w/ low Bps requiring pressors -Family expressed ongoing wishes for aggressive care -Ethics seen 12/30 and suggested time trial of 5-7 days CV while finishing treatment for PNA - CRRT stopped 11/03/20 at 1700 -HD with UF profile + albumin x 2+ midodrine moving forward -there may be a possibility of renal recovery? Doubtful. Although will watch over the weekend and see what her RFP and urine output does. Holding on HD over the weekend, tentatively plan for Monday -moving forward, if she does not tolerate any ultrafiltration with HD then renal replacement therapy in general (including CRRT) will be futile.  -Would recommend against re-initiation of CRRT at that point given futility and would need to discuss with the family what the next steps will be (which would be hospice at that point)  -high BUN: possibly secondary to catabolic state/severe illness?  #SVT: trial w/ fluid bolus. amio  #Hyperkalemia: 2/2 renal failure.  Status post Lokelma with improvement.  Dialysis as above.  Could consider lokelma in between treatments if hyperK does develop. K has suprisingly been okay this week  # h/o hemorrhagic shock/ ABLA- ongoing blood lossTransfuse as needed, have added ESA.  1u prbc today  # Septic shock: still requiring low-dose pressors, over max dose of midodrine, on meropenem empirically.  Mgmt per primary  # Perforated duodenal ulcer- s/p exploratory lap and Graham patch placement per surgery.  # Acute hypoxic/hypercapnic respiratory failure due to ARDS from Recent Covid-19 PNA- currently on vent via trach per PCCM. Weaning efforts have been slow progress.  # Atrial fibrillation- on A/C per primary svc  # Severe  protein malnutrition- per primary svc.   #Hypercalcemia: immobility and CKD contributing, PTH 200 and Vit D normal. Calcium okay today. Could consider Sensipar if continued issue.   # Disposition- poor overall prognosis.    Subjective:   No acute events. 150cc uop. Objective:   BP 129/62   Pulse (!) 118   Temp 99.6 F (37.6 C) (Axillary)   Resp (!) 23   Ht 5' 8"  (1.727 m)   Wt 86.5 kg   SpO2 98%   BMI 29.00 kg/m   Physical Exam: Gen: lying in bed, chronically ill appearing HEENT: trach in place CVS: tachy Resp: unlabored, trach Abd: soft, nontender Ext: edema bilateral UE's ACCESS: R IJ [facility]  Labs: BMET Recent Labs  Lab 11/10/20 1950 11/11/20 0351 11/11/20 1743 11/12/20 0124 11/12/20 0352 11/12/20 1741 11/13/20 0353  NA 133* 135 137 136 134* 136 136  K 3.7 3.8 4.0 2.9* 3.2* 4.2 3.4*  CL 100 101 100 100 98 100 100  CO2 21* 20* 19* 24 24 24 23   GLUCOSE 204* 189* 97 100* 92 140* 84  BUN 104* 112* 121* 31* 39* 59* 71*  CREATININE 1.27* 1.40* 1.55* 0.60 0.76 1.12* 1.20*  CALCIUM 8.7* 8.9 9.2 8.4* 8.9 9.7 10.0  PHOS  --  8.8*  --  2.8 4.0  --  7.1*   CBC Recent Labs  Lab 11/11/20 0351 11/12/20 0124 11/12/20 0352 11/13/20 0353  WBC 10.8* 13.8* 13.6* 11.2*  NEUTROABS 7.1  --   --  7.8*  HGB 6.7* 8.6* 8.5* 8.5*  HCT 21.6* 27.8* 27.5* 27.3*  MCV 95.2 89.1 89.6 89.8  PLT 266 291 294 363  Medications:    . apixaban  2.5 mg Per Tube BID  . B-complex with vitamin C  1 tablet Per Tube Daily  . chlorhexidine gluconate (MEDLINE KIT)  15 mL Mouth Rinse BID  . Chlorhexidine Gluconate Cloth  6 each Topical Daily  . cholecalciferol  2,000 Units Per Tube Daily  . clonazepam  0.5 mg Per Tube TID  . darbepoetin (ARANESP) injection - DIALYSIS  100 mcg Intravenous Q Tue-HD  . docusate  100 mg Per Tube BID  . feeding supplement (PROSource TF)  90 mL Per Tube TID  . fentaNYL  1 patch Transdermal Q72H  . guaiFENesin  15 mL Per Tube Q12H  .  HYDROmorphone  (DILAUDID) injection  1 mg Intravenous Q6H  . influenza vaccine adjuvanted  0.5 mL Intramuscular Tomorrow-1000  . insulin aspart  0-15 Units Subcutaneous Q4H  . insulin aspart  4 Units Subcutaneous Q4H  . insulin glargine  20 Units Subcutaneous BID  . mouth rinse  15 mL Mouth Rinse QID  . midodrine  40 mg Per Tube TID WC  . nutrition supplement (JUVEN)  1 packet Per Tube BID BM  . pantoprazole sodium  40 mg Per Tube BID  . polyethylene glycol  17 g Per Tube Daily  . QUEtiapine  100 mg Per Tube QHS  . QUEtiapine  50 mg Per Tube Daily  . sodium chloride flush  10-40 mL Intracatheter Q12H    Candace [name]  11/13/2020, 12:43 PM

## 2020-11-14 ENCOUNTER — Inpatient Hospital Stay (HOSPITAL_COMMUNITY): Payer: Medicare Other

## 2020-11-14 DIAGNOSIS — R609 Edema, unspecified: Secondary | ICD-10-CM

## 2020-11-14 DIAGNOSIS — J9621 Acute and chronic respiratory failure with hypoxia: Secondary | ICD-10-CM | POA: Diagnosis not present

## 2020-11-14 DIAGNOSIS — N186 End stage renal disease: Secondary | ICD-10-CM | POA: Diagnosis not present

## 2020-11-14 LAB — BASIC METABOLIC PANEL
Anion gap: 14 (ref 5–15)
BUN: 106 mg/dL — ABNORMAL HIGH (ref 8–23)
CO2: 20 mmol/L — ABNORMAL LOW (ref 22–32)
Calcium: 9.6 mg/dL (ref 8.9–10.3)
Chloride: 105 mmol/L (ref 98–111)
Creatinine, Ser: 1.62 mg/dL — ABNORMAL HIGH (ref 0.44–1.00)
GFR, Estimated: 34 mL/min — ABNORMAL LOW (ref >60)
Glucose, Bld: 82 mg/dL (ref 70–99)
Potassium: 3.5 mmol/L (ref 3.5–5.1)
Sodium: 139 mmol/L (ref 135–145)

## 2020-11-14 LAB — GLUCOSE, CAPILLARY
Glucose-Capillary: 82 mg/dL (ref 70–99)
Glucose-Capillary: 83 mg/dL (ref 70–99)
Glucose-Capillary: 106 mg/dL — ABNORMAL HIGH (ref 70–99)
Glucose-Capillary: 118 mg/dL — ABNORMAL HIGH (ref 70–99)
Glucose-Capillary: 98 mg/dL (ref 70–99)

## 2020-11-14 MED ORDER — HYDROMORPHONE HCL 1 MG/ML IJ SOLN
1.0000 mg | Freq: Three times a day (TID) | INTRAMUSCULAR | Status: DC
  Administered 2020-11-14 – 2020-11-16 (×6): 1 mg via INTRAVENOUS
  Filled 2020-11-14 (×6): qty 1

## 2020-11-14 NOTE — Progress Notes (Signed)
RT placed patient on ATC 40% 10L. Patient tolerating well at this time. RN aware. RT will continue to monitor.

## 2020-11-14 NOTE — Progress Notes (Addendum)
[facility] Progress Note   Assessment/ Plan:   1. AKI secondary to ATN from shock Started on CRRT 09/08/20 due to persistent oliguria/anuria and hyperkalemia.CV stopped 11/03/2020.[place] placed on 10/19/20. Family wants ongoing aggressive care, ethic involved, failed multiple transitions to HD secondary to hypotension. Now on MWF IHD. 2. SVT 3. Hyperkalemia, stable this week 4. H/o mixed shock: hemorrhagic and septic shock 5. Anemia secondary to chronic kidney disease and ABLA. S/p 1u prbc 1/13. aranesp dose increase to 153mg 1/11 6. Perforated duodenal ulcer s/p exlap and graham patch placement 7. 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 ARDS 2/2 COVID-19 Pneumonia 9. A.fib 10. Severe calorie malnutrition 11. Hypercalcemia- immobility and CKD  PLAN: -BUN and Cr continue to climb, also in catabolic state. Although urine output may seem to be doing better her BUN continues to climb. Will plan for IHD tomorrow, target UF: 0.5-1L as tolerated. Continue with MWF schedule for now -HD with UF profile + albumin x 2+ midodrine moving forward -fortunately, had not required pressor support for quite some time -moving forward, if she does not tolerate any ultrafiltration with HD then renal replacement therapy in general (including CRRT) will be futile.  -Would recommend against re-initiation of CRRT at that point given futility and would need to discuss with the family what the next steps will be (which would be hospice at that point) -may need to start considering an AVF/AVG placement however her prognosis is poor/guarded, it may not be worth it to put her through that -calcium stable, 2Cal bath. -overall poor prognosis   Subjective:   No acute events. uop charted as 700cc, discussed with rn, turns out there was a discrepancy in charting, more so 400cc in the last 24 hours. BUN 106 Objective:   BP 124/65 (BP Location: Left Arm)   Pulse (!) 117   Temp 99.1 F (37.3 C) (Axillary)    Resp (!) 21   Ht _0  (1.727 m)   Wt 86.5 kg   SpO2 99%   BMI 29.00 kg/m   Physical Exam: Gen: lying in bed, chronically ill appearing HEENT: trach in place CVS: tachy Resp: unlabored, trach Abd: soft, nontender Ext: bl ue edema Neuro: follows commands, awake, slow to respond ACCESS: R IJ [facility]  Labs: BMET Recent Labs  Lab 11/11/20 0351 11/11/20 1743 11/12/20 0124 11/12/20 0352 11/12/20 1741 11/13/20 0353 11/13/20 1709 11/14/20 0456  NA 135 137 136 134* 136 136 138 139  K 3.8 4.0 2.9* 3.2* 4.2 3.4* 4.0 3.5  CL 101 100 100 98 100 100 103 105  CO2 20* 19* _1 20*  GLUCOSE 189* 97 100* 92 140* 84 134* 82  BUN 112* 121* 31* 39* 59* 71* 94* 106*  CREATININE 1.40* 1.55* 0.60 0.76 1.12* 1.20* 1.44* 1.62*  CALCIUM 8.9 9.2 8.4* 8.9 9.7 10.0 9.9 9.6  PHOS 8.8*  --  2.8 4.0  --  7.1*  --   --    CBC Recent Labs  Lab 11/11/20 0351 11/12/20 0124 11/12/20 0352 11/13/20 0353  WBC 10.8* 13.8* 13.6* 11.2*  NEUTROABS 7.1  --   --  7.8*  HGB 6.7* 8.6* 8.5* 8.5*  HCT 21.6* 27.8* 27.5* 27.3*  MCV 95.2 89.1 89.6 89.8  PLT 266 291 294 363      Medications:    . apixaban  2.5 mg Per Tube BID  . B-complex with vitamin C  1 tablet Per Tube Daily  . chlorhexidine gluconate (MEDLINE KIT)  15 mL Mouth Rinse BID  . Chlorhexidine Gluconate Cloth  6 each Topical Daily  . cholecalciferol  2,000 Units Per Tube Daily  . clonazepam  0.5 mg Per Tube TID  . darbepoetin (ARANESP) injection - DIALYSIS  100 mcg Intravenous Q Tue-HD  . docusate  100 mg Per Tube BID  . feeding supplement (PROSource TF)  90 mL Per Tube TID  . fentaNYL  1 patch Transdermal Q72H  . guaiFENesin  15 mL Per Tube Q12H  .  HYDROmorphone (DILAUDID) injection  1 mg Intravenous Q8H  . influenza vaccine adjuvanted  0.5 mL Intramuscular Tomorrow-1000  . insulin aspart  0-15 Units Subcutaneous Q4H  . insulin aspart  4 Units Subcutaneous Q4H  . insulin glargine  20 Units Subcutaneous BID  . mouth rinse  15  mL Mouth Rinse QID  . midodrine  40 mg Per Tube TID WC  . nutrition supplement (JUVEN)  1 packet Per Tube BID BM  . pantoprazole sodium  40 mg Per Tube BID  . polyethylene glycol  17 g Per Tube Daily  . QUEtiapine  100 mg Per Tube QHS  . QUEtiapine  50 mg Per Tube Daily  . sodium chloride flush  10-40 mL Intracatheter Q12H    Amiria Orrison  11/14/2020, 10:48 AM

## 2020-11-14 NOTE — Progress Notes (Signed)
NAMEIreland [name], MRN:  [id number], DOB:  12-12-1950, LOS: 74 ADMISSION DATE:  08/31/2020, CONSULTATION DATE:  09/07/2020 REFERRING MD:  Dr Candiss Norse, CHIEF COMPLAINT:  Acute resp failure  Brief History   70 year old female who was previously diagnosed with Covid 08/23/2020.  Admitted 11/2 with AF-RVR, found to have a perforated duodenal underwent exploratory laparotomy with Phillip Heal patch placement 11/3.  Past Medical History  Covid pneumonia Atrial fibrillation CKD stage III Diabetes mellitus Hypertension Colon cancer Hyperlipidemia  Significant Hospital Events   11/2 Admitted  11/3 OR with findings of perforated duodenal ulcer 11/10 progressive hemorrhagic shock, intubated, transfused, pressors, proned; started on CRRT in PM 11/03 Exploratory laparotomy, Phillip Heal patch, lysis of adhesion for duodenal ulceration postop day 6 11/16 Extubated. Re-intubated overnight due to respiratory distress and hypoxia with decreased mentation 11/18 Bronch, cultures sent 11/19 Hgb down getting blood 11/24 Spiked fever resume empirical antimicrobial therapy 11/26 Hemorrhagic shock, hgb 5.6, increased pressors, CT A/P  11/30 Per palliative "[name] expresses understanding that patient is unfortunately very fragile despite ongoing intensive medical treatment and full mechanical support. She indicates that the family wants to continue with all current interventions despite potential outcomes". 12/08 CRRT discontinued due to clotting 12/09 Family requested transfer to tertiary care [facility] denied transfer  12/10 CRRT restarted. Episodes of tachycardia, tachypnea that seem to improve with pain management 12/11 Back in shock. Pressor requirements up. CXR worse. ABX resumed 12/12 Still requiring inc pressors. Had hypoglycemic event. Basal insulin dosing adjusted 12/13 Pressor requirements better. Now hyperglycemic. Re-adjusted Glycemic control  12/14 Changed dilaudid to1/2 dosing from day further. D/c  vasopressin.  Goals of care reconfirmed with daughter.  Patient continues to desire aggressive care.  Not open to discussing any other option, patient family continues to be hopeful that she will be discharged to home with full recovery in spite of multiple attempts by staff to prepare them that this is unlikely scenario 12/15 Dilaudid discontinued sending cortisol for ongoing pressor dependence 12/16 Serum cortisol <20, added stress dose steroids.  PRN Dilaudid, attempting not go back on Dilaudid infusion 12/17 Developed worsening tachycardia during the evening hours received initially IV albumin, followed by resuming IV Dilaudid with question of suboptimal pain control.  Currently looks better back on Dilaudid drip.  We have been able to wean pressors after adding stress dose steroids; near arrest - bradycardia, better with atropine  12/19 Afebrile . Remains on dilaudid and heparin gtt, dilaudid gtt increased overnight for concern of pain / ongoing tachycardia, no other events . NE and precedex off 12/17. Ongoing CRRT- even UF, dosing lokelmia this morning 12/20 On CRRT.  Renal plans for HD cath and moving to HD. Getting wound care  12/23 -No vent weaning per RT, remains on full support, #8 trach in place but previously had #6, no documentation of change noted in chart Afebrile / WBC 20.4  Vent - 30% FiO2, PEEP 5 Glucose range 176-212 I/O 465 ml stool, 3.6L removed with HD, -1.1L in last 24 hours  RN reports ongoing periods of tachypnea / vent dyssynchrony that responds to dilaudid   12/24 - renal stopping CRRT today and plans HD 10/24/20 . 40% fio2 on vent via Trach. TAchypenic and tachycardic. Afebrile but wbc up to 27.6K. On TF. Onn heparin gtt  12/25 - Back on CRRT. On vent via trach at 40% fio2, On scheduled dilaudid as add on to oxy. Per family request 12/24 - they felt scheduled oxy was not adequate and patient was showing  signs of opioid withdrawal.  Patient also had  worsening SIRS/sepsis  syndrome. Had fever, rising wbc, worsening RR 40-60 and HR 140s sinus-> started On abx yesterday. Fever some better today. WBC plateau at 28,.5K. On new levophed -> signifanct improvement in HR 77 and RR t0 20. On heparin gtt.  On precedex gtt. On levophed gtt 57mcg wthi midodrine. On TF 12/30 Remains on CRRT with intermittent pressor requirements. Ethics consult placed evening of 12/29. Ethics rec time trial of CRRT 12/31 failed SBT with RR 40s. Several conversations between care tam and family, who are upset RE plan of care 1/1 back on pressors  1/4: On pressors, keeping even on CVVHD, HGB drop to 6.9, transfused 1 unit. Improving mental status 1/10 remains on low dose levophed 105mcg 1/11 off levo, attempting HD with UF for first time.   Consults:  Cardiology [facility] Nephrology  Ethics  Procedures:  R PICC 11/5 >> A line 11/9 >> out ETT 11/9 > 11/16, 11/16 >> 09/21/2020, 09/21/2020 tracheostomy>> Lt [place] CVL 11/9 >> R IJ trialysis >> out HD catheter 12/1 >>12/20 12/21 - 14.5 Fr, 23 cm right IJ tunneled hemodialysis catheter placement.  Removal of indwelling subclavian catheter.   Significant Diagnostic Tests:  11/3 CT abd/ pelvis > 1. Positive for bowel perforation: Pneumoperitoneum and intermediate density free fluid in the abdomen. Prior total colectomy. The specific site of perforation is unclear-oral contrast present to the proximal jejunum has not obviously leaked. Note that there may be small bowel loops adherent to the ventral abdominal wall along the greater curve of the stomach. 2. Extensive bilateral lower lung pneumonia. No pleural effusion. 3. Other abdominal and pelvic viscera are stable since 2015, including bilateral adrenal adenomas. Chronic renal parapelvic cysts. 4. Aortic Atherosclerosis 11/3 TTE > EF 70-75%, RV not well visualized, mildly reduced RV systolic function 76/16 CT chest/ abd/ pelvis> 1. Interval progression of diffuse bilateral hazy ground-glass airspace  opacities with more focal areas of consolidation at the lung bases 2. Trace bilateral pleural effusions. 3. Postsurgical changes the abdomen as detailed above. No evidence for a postoperative abscess, however evaluation is limited by lack of IV contrast. 4. There is a 1.9 cm cystic appearing lesion located in the pancreatic body. This was not present on the patient's CT from 2015.  Follow-up with an outpatient contrast enhanced MRI is recommended. 5. The endometrial stripe appears diffusely thickened. Follow-up with pelvic ultrasound is recommended. Aortic Atherosclerosis 11/14 LE doppler studies > + DVT of right posterior tibial and peroneal vein, +dVT of left posterior tibial vein  11/26 CT ABD PEL > liver unremarkable, distended gallbladder with layering tiny gallstones versus sludge, no duct dilatation, mild hyperdensity right upper pole renal collecting system new.  No evidence of retroperitoneal bleeding.  Multifocal lower lobe predominant pulmonary infiltrates/pneumonia.  Small left pleural effusion. 1/16 Venous duplex RUE >>   Micro Data:  11/10 MRSA PCR > neg 11/10 BC x 2 > neg 09/22/2020 blood cultures x2>> S epi.  09/22/2020 sputum culture>> MRSA Blood 12/1 >> negative. S.epi 1 out of 2, likely contaminant BCx 2 12/12 >> neg xxx Trach aspirate 12/24 - FEW STAPHYLOCOCCUS AUREUS  Blood 12/24 > Negative  Blood 1/2: neg Sputum 1/4: MSSA  Antimicrobials:  azithro 11/2 >11/3 Ceftriaxone 11/2  Fluconazole 11/3 Zosyn 11/3 >> 11/7 Vanc 11/10 off Cefepime 11/10 > 11/16 09/22/2020 vancomycin for MRSA PNA >> 12/2 xxxx Vanc 12/11> 12/17 Zosyn 12/11> 12/17 xxxx vanc 12/24 (staph resp cutlure) >> 12/28 Zosyn 12/24 - 12/27  Cefazolin 12/27 >>1/1 linezolid 1/2 eraxis 1/2  Meropenem 1/2 > 1/8  Subjective:   Remains hemodynamically stable, heart rate 120s, sinus Tolerating PSV currently I/O+ 14.7 L total, urine output 400 cc last 24 hours  Objective   Blood pressure 120/64,  pulse (!) 120, temperature 99.1 F (37.3 C), temperature source Axillary, resp. rate (!) 22, height 5\' 8"  (1.727 m), weight 86.5 kg, SpO2 97 %.    Vent Mode: CPAP;PSV FiO2 (%):  [30 %] 30 % Set Rate:  [28 bmp] 28 bmp Vt Set:  [450 mL] 450 mL PEEP:  [5 cmH20] 5 cmH20 Pressure Support:  [10 DPO24-23 cmH20] 10 cmH20 Plateau Pressure:  [14 cmH20-22 cmH20] 22 cmH20   Intake/Output Summary (Last 24 hours) at 11/14/2020 0810 Last data filed at 11/14/2020 0700 Gross per 24 hour  Intake 228.6 ml  Output 750 ml  Net -521.4 ml   Filed Weights   11/07/20 1045 11/09/20 0645 11/09/20 0949  Weight: 82.7 kg 87 kg 86.5 kg   Physical Exam  General: Ill-appearing woman, Ventilated HEENT: Tracheostomy in place, stoma looks good, oropharynx clear Neuro: Awake, tracks, nods to questions, slow to respond but does follow commands, globally weak CV: Regular, tachycardic 125, distant, no murmur PULM: Decreased bilateral breath sounds, no wheezes, no crackles GI: Nondistended, positive bowel sounds Extremities: No lower extremity edema Skin: Sacral wound not examined  .   Resolved problems:  Previously hemorrhagic shock requiring PRBCs, Septic shock, MRSA pneumonia s/p 8 days abx Complicated by MRSA healthcare associated pneumonia (treated) Peritonitis and perforated ulcer post repair 09/01/20 MSSA HAP  Assessment:  Charina Fons is a 70 y.o. with history of COVID 19 infection requiring hospitalization in October 2021 with current admission for hemorrhagic shock secondary to perforated gastric ulcer requiring surgical intervention and [place]. 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  Persistent SVT, sinus tachycardia -Remains on vasopressors, on high-dose midodrine -Amiodarone discontinued -IV metoprolol ordered as needed for tachycardia.  Have considered adding scheduled enteral metoprolol but do not want to lose her current hemodynamic stability, ability to perform intermittent HD.  Will be conservative here   Renal failure -Appreciate nephrology management -Continue to follow urine output, 400 cc last 24 hours -Likely repeat intermittent HD on 1/17 given her progressive uremia as we continue to follow her renal recovery  Afib/DVT lower extremity Asymmetric right upper extremity edema/swelling -Continue Eliquis -Duplex ultrasound right upper extremity ordered and pending  Anemia No obvious bleeding, suspect nosocomial losses with chronic disease -Hemoglobin goal 7.0, transfuse if indicated -Iron supplementation 1/13  Critical Illness Myopathy Severe deconditioning  Inadequate PO intake  -Continue to push nutrition, tube feeding.  Has been held intermittently over the last 48 hours due to emesis.  NG tube appears to be in a postpyloric position -Physical therapy as she can tolerate  DM2 with hyperglycemia -Slight scale insulin -NovoLog 4 units every 4 hours scheduled, held when her tube feeding was held -Lantus 20 units twice daily  Stage IV sacral ulcer -Appreciate WOCN consultation, continue wound care as  ordered  History of stage IV colon cancer 1.9 cm cystic-appearing lesion located in pancreatic body no intervention at this time. -Would need follow-up as an outpatient  [place] -multiple [place] conversations with various PCCM providers and family have not yielded changes in scope of tx, multiple PCCM providers  have been fired by family.  -concerns for futility of certain interventions -ethics consulted 12/30-- recs for time limited trial of CRRT, please see separate Ethics consult note for full details. Futility policy reviewed with family by ethics. Ongoing meetings with Dr. Hulen Skains with nursing administration and family. The ultimate goal is to transition her safely to HD from CRRT.  Best practice:  Diet: EN  Pain/Anxiety/Delirium protocol (if indicated): Precedex was discontinued 11/11/2020 continues to have fentanyl patch and Dilaudid scheduled VAP protocol (if indicated): yes DVT prophylaxis: Eliquis GI prophylaxis: Protonix Glucose control: SSI, TF coverage, lantus Mobility: PT Family communication: Family updates daily per MD  Independent CC time 31 minutes  Baltazar Apo, MD, PhD 11/14/2020, 8:10 AM [facility] [phone] or if no answer [phone]

## 2020-11-14 NOTE — Progress Notes (Signed)
eLink Physician-Brief Progress Note Patient Name: Candace [name] DOB: [birth year] MRN: [id number]   Date of Service  11/14/2020  HPI/Events of Note  RN requesting an ultrasound of her right upper extremity, has generalized swelling, however her right upper extremity is much more swollen than her left arm. She does have a PICC in that arm which looks fine, although a little sluggish when flushed.  She has reported pain in her right hand, she has good pulses in both upper and lower extremities, and she is on eliquis.  eICU Interventions  - get stat Venous duplex to r/o DVT. On eliquis . Covid in October. Now negative.  consider removing PICC if has DVT.      Intervention Category Intermediate Interventions: Other:  Elmer Sow 11/14/2020, 1:41 AM

## 2020-11-14 NOTE — Progress Notes (Signed)
Assisted tele visit to patient with daughter.  Leen Tworek Ann, RN  

## 2020-11-15 DIAGNOSIS — J9621 Acute and chronic respiratory failure with hypoxia: Secondary | ICD-10-CM | POA: Diagnosis not present

## 2020-11-15 LAB — PHOSPHORUS: Phosphorus: 9.3 mg/dL — ABNORMAL HIGH (ref 2.5–4.6)

## 2020-11-15 LAB — BASIC METABOLIC PANEL
Anion gap: 17 — ABNORMAL HIGH (ref 5–15)
BUN: 139 mg/dL — ABNORMAL HIGH (ref 8–23)
CO2: 18 mmol/L — ABNORMAL LOW (ref 22–32)
Calcium: 8.8 mg/dL — ABNORMAL LOW (ref 8.9–10.3)
Chloride: 106 mmol/L (ref 98–111)
Creatinine, Ser: 1.74 mg/dL — ABNORMAL HIGH (ref 0.44–1.00)
GFR, Estimated: 31 mL/min — ABNORMAL LOW (ref >60)
Glucose, Bld: 109 mg/dL — ABNORMAL HIGH (ref 70–99)
Potassium: 3.3 mmol/L — ABNORMAL LOW (ref 3.5–5.1)
Sodium: 141 mmol/L (ref 135–145)

## 2020-11-15 LAB — GLUCOSE, CAPILLARY
Glucose-Capillary: 103 mg/dL — ABNORMAL HIGH (ref 70–99)
Glucose-Capillary: 120 mg/dL — ABNORMAL HIGH (ref 70–99)
Glucose-Capillary: 120 mg/dL — ABNORMAL HIGH (ref 70–99)
Glucose-Capillary: 126 mg/dL — ABNORMAL HIGH (ref 70–99)
Glucose-Capillary: 136 mg/dL — ABNORMAL HIGH (ref 70–99)
Glucose-Capillary: 80 mg/dL (ref 70–99)
Glucose-Capillary: 97 mg/dL (ref 70–99)

## 2020-11-15 LAB — MAGNESIUM: Magnesium: 1.8 mg/dL (ref 1.7–2.4)

## 2020-11-15 NOTE — Progress Notes (Signed)
NAMEBraxton [name], MRN:  [id number], DOB:  Sep 09, 1951, LOS: 20 ADMISSION DATE:  08/31/2020, CONSULTATION DATE:  09/07/2020 REFERRING MD:  Dr Candiss Norse, CHIEF COMPLAINT:  Acute resp failure  Brief History   70 year old female who was previously diagnosed with Covid 08/23/2020.  Admitted 11/2 with AF-RVR, found to have a perforated duodenal underwent exploratory laparotomy with Phillip Heal patch placement 11/3.  Past Medical History  Covid pneumonia Atrial fibrillation CKD stage III Diabetes mellitus Hypertension Colon cancer Hyperlipidemia  Significant Hospital Events   11/2 Admitted  11/3 OR with findings of perforated duodenal ulcer 11/10 progressive hemorrhagic shock, intubated, transfused, pressors, proned; started on CRRT in PM 11/03 Exploratory laparotomy, Phillip Heal patch, lysis of adhesion for duodenal ulceration postop day 6 11/16 Extubated. Re-intubated overnight due to respiratory distress and hypoxia with decreased mentation 11/18 Bronch, cultures sent 11/19 Hgb down getting blood 11/24 Spiked fever resume empirical antimicrobial therapy 11/26 Hemorrhagic shock, hgb 5.6, increased pressors, CT A/P  11/30 Per palliative "[name] expresses understanding that patient is unfortunately very fragile despite ongoing intensive medical treatment and full mechanical support. She indicates that the family wants to continue with all current interventions despite potential outcomes". 12/08 CRRT discontinued due to clotting 12/09 Family requested transfer to tertiary care [facility] denied transfer  12/10 CRRT restarted. Episodes of tachycardia, tachypnea that seem to improve with pain management 12/11 Back in shock. Pressor requirements up. CXR worse. ABX resumed 12/12 Still requiring inc pressors. Had hypoglycemic event. Basal insulin dosing adjusted 12/13 Pressor requirements better. Now hyperglycemic. Re-adjusted Glycemic control  12/14 Changed dilaudid to1/2 dosing from day further. D/c  vasopressin.  Goals of care reconfirmed with daughter.  Patient continues to desire aggressive care.  Not open to discussing any other option, patient family continues to be hopeful that she will be discharged to home with full recovery in spite of multiple attempts by staff to prepare them that this is unlikely scenario 12/15 Dilaudid discontinued sending cortisol for ongoing pressor dependence 12/16 Serum cortisol <20, added stress dose steroids.  PRN Dilaudid, attempting not go back on Dilaudid infusion 12/17 Developed worsening tachycardia during the evening hours received initially IV albumin, followed by resuming IV Dilaudid with question of suboptimal pain control.  Currently looks better back on Dilaudid drip.  We have been able to wean pressors after adding stress dose steroids; near arrest - bradycardia, better with atropine  12/19 Afebrile . Remains on dilaudid and heparin gtt, dilaudid gtt increased overnight for concern of pain / ongoing tachycardia, no other events . NE and precedex off 12/17. Ongoing CRRT- even UF, dosing lokelmia this morning 12/20 On CRRT.  Renal plans for HD cath and moving to HD. Getting wound care  12/23 -No vent weaning per RT, remains on full support, #8 trach in place but previously had #6, no documentation of change noted in chart Afebrile / WBC 20.4  Vent - 30% FiO2, PEEP 5 Glucose range 176-212 I/O 465 ml stool, 3.6L removed with HD, -1.1L in last 24 hours  RN reports ongoing periods of tachypnea / vent dyssynchrony that responds to dilaudid   12/24 - renal stopping CRRT today and plans HD 10/24/20 . 40% fio2 on vent via Trach. TAchypenic and tachycardic. Afebrile but wbc up to 27.6K. On TF. Onn heparin gtt  12/25 - Back on CRRT. On vent via trach at 40% fio2, On scheduled dilaudid as add on to oxy. Per family request 12/24 - they felt scheduled oxy was not adequate and patient was showing  signs of opioid withdrawal.  Patient also had  worsening SIRS/sepsis  syndrome. Had fever, rising wbc, worsening RR 40-60 and HR 140s sinus-> started On abx yesterday. Fever some better today. WBC plateau at 28,.5K. On new levophed -> signifanct improvement in HR 77 and RR t0 20. On heparin gtt.  On precedex gtt. On levophed gtt 104mcg wthi midodrine. On TF 12/30 Remains on CRRT with intermittent pressor requirements. Ethics consult placed evening of 12/29. Ethics rec time trial of CRRT 12/31 failed SBT with RR 40s. Several conversations between care tam and family, who are upset RE plan of care 1/1 back on pressors  1/4: On pressors, keeping even on CVVHD, HGB drop to 6.9, transfused 1 unit. Improving mental status 1/10 remains on low dose levophed 35mcg 1/11 off levo, attempting HD with UF for first time.   Consults:  Cardiology [facility] Nephrology  Ethics  Procedures:  R PICC 11/5 >> A line 11/9 >> out ETT 11/9 > 11/16, 11/16 >> 09/21/2020, 09/21/2020 tracheostomy>> Lt [place] CVL 11/9 >> R IJ trialysis >> out HD catheter 12/1 >>12/20 12/21 - 14.5 Fr, 23 cm right IJ tunneled hemodialysis catheter placement.  Removal of indwelling subclavian catheter.   Significant Diagnostic Tests:  11/3 CT abd/ pelvis > 1. Positive for bowel perforation: Pneumoperitoneum and intermediate density free fluid in the abdomen. Prior total colectomy. The specific site of perforation is unclear-oral contrast present to the proximal jejunum has not obviously leaked. Note that there may be small bowel loops adherent to the ventral abdominal wall along the greater curve of the stomach. 2. Extensive bilateral lower lung pneumonia. No pleural effusion. 3. Other abdominal and pelvic viscera are stable since 2015, including bilateral adrenal adenomas. Chronic renal parapelvic cysts. 4. Aortic Atherosclerosis 11/3 TTE > EF 70-75%, RV not well visualized, mildly reduced RV systolic function 31/51 CT chest/ abd/ pelvis> 1. Interval progression of diffuse bilateral hazy ground-glass airspace  opacities with more focal areas of consolidation at the lung bases 2. Trace bilateral pleural effusions. 3. Postsurgical changes the abdomen as detailed above. No evidence for a postoperative abscess, however evaluation is limited by lack of IV contrast. 4. There is a 1.9 cm cystic appearing lesion located in the pancreatic body. This was not present on the patient's CT from 2015.  Follow-up with an outpatient contrast enhanced MRI is recommended. 5. The endometrial stripe appears diffusely thickened. Follow-up with pelvic ultrasound is recommended. Aortic Atherosclerosis 11/14 LE doppler studies > + DVT of right posterior tibial and peroneal vein, +dVT of left posterior tibial vein  11/26 CT ABD PEL > liver unremarkable, distended gallbladder with layering tiny gallstones versus sludge, no duct dilatation, mild hyperdensity right upper pole renal collecting system new.  No evidence of retroperitoneal bleeding.  Multifocal lower lobe predominant pulmonary infiltrates/pneumonia.  Small left pleural effusion. 1/16 Venous duplex RUE >>   Micro Data:  11/10 MRSA PCR > neg 11/10 BC x 2 > neg 09/22/2020 blood cultures x2>> S epi.  09/22/2020 sputum culture>> MRSA Blood 12/1 >> negative. S.epi 1 out of 2, likely contaminant BCx 2 12/12 >> neg xxx Trach aspirate 12/24 - FEW STAPHYLOCOCCUS AUREUS  Blood 12/24 > Negative  Blood 1/2: neg Sputum 1/4: MSSA  Antimicrobials:  azithro 11/2 >11/3 Ceftriaxone 11/2  Fluconazole 11/3 Zosyn 11/3 >> 11/7 Vanc 11/10 off Cefepime 11/10 > 11/16 09/22/2020 vancomycin for MRSA PNA >> 12/2 xxxx Vanc 12/11> 12/17 Zosyn 12/11> 12/17 xxxx vanc 12/24 (staph resp cutlure) >> 12/28 Zosyn 12/24 - 12/27  Cefazolin 12/27 >>1/1 linezolid 1/2 eraxis 1/2  Meropenem 1/2 > 1/8  Subjective:   Still off pressors 600 cc urine over the last 24 hours 0.30, PEEP 5 Progressive uremia Serum creatinine 1.44 > 1.62 > 1.74  Objective   Blood pressure 119/77, pulse (!)  122, temperature 99.2 F (37.3 C), temperature source Oral, resp. rate 20, height 5\' 8"  (1.727 m), weight 86.5 kg, SpO2 100 %.    Vent Mode: PRVC FiO2 (%):  [30 %-40 %] 40 % Set Rate:  [28 bmp] 28 bmp Vt Set:  [450 mL] 450 mL PEEP:  [5 cmH20] 5 cmH20 Plateau Pressure:  [18 cmH20-22 cmH20] 18 cmH20   Intake/Output Summary (Last 24 hours) at 11/15/2020 0850 Last data filed at 11/15/2020 0800 Gross per 24 hour  Intake 341.52 ml  Output 1000 ml  Net -658.48 ml   Filed Weights   11/07/20 1045 11/09/20 0645 11/09/20 0949  Weight: 82.7 kg 87 kg 86.5 kg   Physical Exam  General: Chronically ill-appearing woman, no distress, ventilated HEENT: Tracheostomy in place, stoma clean and dry, oropharynx clear Neuro: Awake, tracks, nods to questions, slow to respond but does follow commands, tries to speak but difficult understand, very weak globally CV: Regular, tachycardic, 125, distant, no murmur PULM: Decreased bilateral breath sounds, no wheezing, no crackles GI: Nondistended, positive bowel sounds Extremities: No lower extremity edema Skin: Sacral wound not examined  .   Resolved problems:  Previously hemorrhagic shock requiring PRBCs, Septic shock, MRSA pneumonia s/p 8 days abx Complicated by MRSA healthcare associated pneumonia (treated) Peritonitis and perforated ulcer post repair 09/01/20 MSSA HAP  Assessment:  Kamile Fassler is a 70 y.o. with history of COVID 19 infection requiring hospitalization in October 2021 with current admission for hemorrhagic shock secondary to perforated gastric ulcer requiring surgical intervention and [place]. Her medical issues are as follows:  Acute metabolic encephalopathy : Prolonged critical illness, sedation/pain Slowly improving -Off Precedex since 1/13 -On scheduled clonazepam, fentanyl patch, Seroquel -Decreased frequency of scheduled Dilaudid to every 8 hours on 1/16, appears to be tolerating.  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  Currently on ATC this morning -Pulmonary hygiene  Persistent vasoplegia vs circulatory shock.  Persistent SVT, sinus tachycardia -Remains off vasopressors, off amiodarone -On high-dose midodrine -Considered adding enteral metoprolol given her sinus tachycardia but concerned that this may impact her hemodynamics, impact her ability to tolerate IHD.  Being very conservative here.  IV metoprolol is ordered as needed.  Renal failure -Appreciate nephrology management -Continues to have urine output, following -Planning for intermittent HD today 1/17 given her progressive uremia.  Hopefully she will continue to have some renal recovery, frequency of IHD will decrease, goal to off  Afib/DVT lower extremity Asymmetric right upper extremity edema/swelling -Continue Eliquis -No evidence DVT in the upper extremity on 1/16, no indication to remove PICC at this time  Anemia No obvious bleeding, suspect nosocomial losses with chronic disease -Continue hemoglobin goal 7.0, transfuse if indicated -Iron supplementation 1/13  Critical Illness Myopathy Severe deconditioning  Inadequate PO intake  -Continue to push nutrition, tube feeding.  Has been held intermittently over the last few days hours due to emesis.  NG tube appears to be in a postpyloric position.  Tube feeding now running at 30 cc/h -Continue physical therapy as she can tolerate  DM2 with hyperglycemia -Sliding scale insulin -NovoLog 4 units every 4 hours scheduled when her tube feeds are running -Lantus 20 units twice daily  Stage IV sacral ulcer -Appreciate WOC consultation, continue wound care as ordered  History of stage IV colon cancer 1.9 cm cystic-appearing lesion located in pancreatic body no intervention at this time. -Would need  follow-up as an outpatient  [place] -multiple [place] conversations with various PCCM providers and family have not yielded changes in scope of tx, multiple PCCM providers have been fired by family.  -concerns for futility of certain interventions -ethics consulted 12/30-- recs for time limited trial of CRRT, please see separate Ethics consult note for full details. Futility policy reviewed with family by ethics. Ongoing meetings with Dr. Hulen Skains with nursing administration and family. The ultimate goal is to transition her safely to HD from CRRT.  Best practice:  Diet: EN  Pain/Anxiety/Delirium protocol (if indicated): Precedex was discontinued 11/11/2020 continues to have fentanyl patch and Dilaudid scheduled VAP protocol (if indicated): yes DVT prophylaxis: Eliquis GI prophylaxis: Protonix Glucose control: SSI, TF coverage, lantus Mobility: PT Family communication: Spoke with with daughter [name] on 1/17.   Independent CC time 31 minutes   Baltazar Apo, MD, PhD 11/15/2020, 8:50 AM [facility] [phone] or if no answer [phone]

## 2020-11-15 NOTE — Progress Notes (Signed)
Assisted tele visit to patient with family member.  Sinclair Alligood D Jennie Hannay, RN   

## 2020-11-15 NOTE — Progress Notes (Signed)
Rachael, RN called and notified that the patient's HD tx has been moved to 11/16/2020.

## 2020-11-15 NOTE — Progress Notes (Signed)
Assisted tele visit to patient with family member.  Quinnetta Roepke P, RN  

## 2020-11-15 NOTE — Progress Notes (Signed)
[facility] Progress Note   Complicated with hemorrhagic shock from perf gastric ulcer and has been on and off CRRT multiple times. Multiple [place] conversations with multiple providers + ethics involved as well. Recs were time limited trial of CRRT.   Assessment:   1. oliguria/anuria and hyperkalemia.CV stopped 11/03/2020.[place] placed on 10/19/20. Family wants ongoing aggressive care, ethics involved, failed multiple transitions to HD secondary to hypotension. Now on MWF IHD 2. SVT 3. Hyperkalemia, stable this week 4. H/o mixed shock: hemorrhagic and septic shock 5. Anemia secondary to chronic kidney disease and ABLA. S/p 1u prbc 1/13. aranesp dose increase to 123mg 1/11 6. Perforated duodenal ulcer s/p exlap and graham patch placement 7. 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 ARDS 2/2 COVID-19 Pneumonia 9. A.fib/ DVT 10. Severe calorie malnutrition 11. H/o stage IV colon cancer 12. Hypercalcemia- immobility and CKD  PLAN: -BUN and Cr continue to climb, also in catabolic state. Oliguric urine output level. Plan for IHD today, target UF: 0.5-1L as tolerated. Continue with MWF schedule for now -HD with UF profile 4 + albumin x 2+ midodrine moving forward; not on pressors at this time. -moving forward, if she does not tolerate any ultrafiltration with HD then renal replacement therapy in general (including CRRT) will be futile.  -Would recommend against re-initiation of CRRT at that point given futility and would need to discuss with the family what the next steps will be (which would be hospice at that point) -I would not  consider an AVF/AVG placement given  her poor prognosis -calcium stable, 2Cal bath. -overall poor prognosis  Subjective:   Oliguric (<4054m24hr), no acute events overnight   Objective:   BP 119/77 (BP Location: Left Arm)   Pulse (!) 122   Temp 99.2 F (37.3 C) (Oral)   Resp 20   Ht 5' 8"  (1.727 m)   Wt 86.5 kg   SpO2 100%   BMI 29.00 kg/m    Intake/Output Summary (Last 24 hours) at 11/15/2020 [id number] data filed at 11/15/2020 0800 Gross per 24 hour  Intake 331.54 ml  Output 1000 ml  Net -668.46 ml   Weight change:   Physical Exam: Gen: lying in bed, chronically ill appearing HEENT: trach in place CVS: tachy Resp: unlabored, trach Abd: soft, nontender Ext: bl ue edema Neuro: follows commands, awake, slow to respond ACCESS: R IJ TDC  Imaging: VAS [place]ER EXTREMITY VENOUS DUPLEX  Result Date: 11/15/2020 UPPER VENOUS STUDY  Indications: Edema, and PICC line Limitations: Body habitus, bandages, line and patient's inability to keep arm outstretched. Comparison Study: No prior study Performing Technologist: CaSharion DoveVS  Examination Guidelines: A complete evaluation includes B-mode imaging, spectral Doppler, color Doppler, and power Doppler as needed of all accessible portions of each vessel. Bilateral testing is considered an integral part of a complete examination. Limited examinations for reoccurring indications may be performed as noted.  Right Findings: +----------+------------+---------+-----------+----------+--------------+ RIGHT     CompressiblePhasicitySpontaneousProperties   Summary     +----------+------------+---------+-----------+----------+--------------+ IJV                                                 Not visualized +----------+------------+---------+-----------+----------+--------------+ Subclavian               Yes       Yes                             +----------+------------+---------+-----------+----------+--------------+  Axillary                 Yes       Yes                             +----------+------------+---------+-----------+----------+--------------+ Brachial      Full       Yes       Yes                             +----------+------------+---------+-----------+----------+--------------+ Cephalic      Full                                                  +----------+------------+---------+-----------+----------+--------------+ Basilic                                             Not visualized +----------+------------+---------+-----------+----------+--------------+  Summary:  Right: No evidence of deep vein thrombosis in the upper extremity. No evidence of superficial vein thrombosis in the visualized veins of the upper extremity.  *See table(s) above for measurements and observations.  Diagnosing physician: Servando Snare MD Electronically signed by Servando Snare MD on 11/15/2020 at 7:23:13 AM.    Final     Labs: BMET Recent Labs  Lab 11/11/20 9047 11/11/20 1743 11/12/20 0124 11/12/20 5339 11/12/20 1741 11/13/20 0353 11/13/20 1709 11/14/20 0456 11/15/20 0543  NA 135   < > 136 134* 136 136 138 139 141  K 3.8   < > 2.9* 3.2* 4.2 3.4* 4.0 3.5 3.3*  CL 101   < > 100 98 100 100 103 105 106  CO2 20*   < > 24 24 24 23 22  20* 18*  GLUCOSE 189*   < > 100* 92 140* 84 134* 82 109*  BUN 112*   < > 31* 39* 59* 71* 94* 106* 139*  CREATININE 1.40*   < > 0.60 0.76 1.12* 1.20* 1.44* 1.62* 1.74*  CALCIUM 8.9   < > 8.4* 8.9 9.7 10.0 9.9 9.6 8.8*  PHOS 8.8*  --  2.8 4.0  --  7.1*  --   --  9.3*   < > = values in this interval not displayed.   CBC Recent Labs  Lab 11/11/20 0351 11/12/20 0124 11/12/20 0352 11/13/20 0353  WBC 10.8* 13.8* 13.6* 11.2*  NEUTROABS 7.1  --   --  7.8*  HGB 6.7* 8.6* 8.5* 8.5*  HCT 21.6* 27.8* 27.5* 27.3*  MCV 95.2 89.1 89.6 89.8  PLT 266 291 294 363    Medications:    . apixaban  2.5 mg Per Tube BID  . B-complex with vitamin C  1 tablet Per Tube Daily  . chlorhexidine gluconate (MEDLINE KIT)  15 mL Mouth Rinse BID  . Chlorhexidine Gluconate Cloth  6 each Topical Daily  . cholecalciferol  2,000 Units Per Tube Daily  . clonazepam  0.5 mg Per Tube TID  . darbepoetin (ARANESP) injection - DIALYSIS  100 mcg Intravenous Q Tue-HD  . docusate  100 mg Per Tube BID  . feeding supplement (PROSource TF)  90 mL Per  Tube TID  . fentaNYL  1 patch Transdermal  Q72H  . guaiFENesin  15 mL Per Tube Q12H  .  HYDROmorphone (DILAUDID) injection  1 mg Intravenous Q8H  . influenza vaccine adjuvanted  0.5 mL Intramuscular Tomorrow-1000  . insulin aspart  0-15 Units Subcutaneous Q4H  . insulin aspart  4 Units Subcutaneous Q4H  . insulin glargine  20 Units Subcutaneous BID  . mouth rinse  15 mL Mouth Rinse QID  . midodrine  40 mg Per Tube TID WC  . nutrition supplement (JUVEN)  1 packet Per Tube BID BM  . pantoprazole sodium  40 mg Per Tube BID  . polyethylene glycol  17 g Per Tube Daily  . QUEtiapine  100 mg Per Tube QHS  . QUEtiapine  50 mg Per Tube Daily  . sodium chloride flush  10-40 mL Intracatheter Q12H      Otelia Santee, MD 11/15/2020, 9:23 AM

## 2020-11-15 NOTE — Progress Notes (Signed)
RT placed patient on ATC 40% 10L with RN at bedside. Patient tolerating well at this time. RT will continue to monitor.

## 2020-11-16 DIAGNOSIS — K265 Chronic or unspecified duodenal ulcer with perforation: Secondary | ICD-10-CM | POA: Diagnosis not present

## 2020-11-16 DIAGNOSIS — N186 End stage renal disease: Secondary | ICD-10-CM | POA: Diagnosis not present

## 2020-11-16 DIAGNOSIS — J9621 Acute and chronic respiratory failure with hypoxia: Secondary | ICD-10-CM | POA: Diagnosis not present

## 2020-11-16 DIAGNOSIS — I4891 Unspecified atrial fibrillation: Secondary | ICD-10-CM | POA: Diagnosis not present

## 2020-11-16 LAB — BASIC METABOLIC PANEL
Anion gap: 16 — ABNORMAL HIGH (ref 5–15)
BUN: 160 mg/dL — ABNORMAL HIGH (ref 8–23)
CO2: 17 mmol/L — ABNORMAL LOW (ref 22–32)
Calcium: 8.3 mg/dL — ABNORMAL LOW (ref 8.9–10.3)
Chloride: 109 mmol/L (ref 98–111)
Creatinine, Ser: 1.78 mg/dL — ABNORMAL HIGH (ref 0.44–1.00)
GFR, Estimated: 31 mL/min — ABNORMAL LOW (ref >60)
Glucose, Bld: 204 mg/dL — ABNORMAL HIGH (ref 70–99)
Potassium: 3.4 mmol/L — ABNORMAL LOW (ref 3.5–5.1)
Sodium: 142 mmol/L (ref 135–145)

## 2020-11-16 LAB — GLUCOSE, CAPILLARY
Glucose-Capillary: 112 mg/dL — ABNORMAL HIGH (ref 70–99)
Glucose-Capillary: 120 mg/dL — ABNORMAL HIGH (ref 70–99)
Glucose-Capillary: 151 mg/dL — ABNORMAL HIGH (ref 70–99)
Glucose-Capillary: 158 mg/dL — ABNORMAL HIGH (ref 70–99)
Glucose-Capillary: 167 mg/dL — ABNORMAL HIGH (ref 70–99)
Glucose-Capillary: 182 mg/dL — ABNORMAL HIGH (ref 70–99)
Glucose-Capillary: 44 mg/dL — CL (ref 70–99)

## 2020-11-16 LAB — MAGNESIUM: Magnesium: 1.8 mg/dL (ref 1.7–2.4)

## 2020-11-16 MED ORDER — DARBEPOETIN ALFA 100 MCG/0.5ML IJ SOSY
PREFILLED_SYRINGE | INTRAMUSCULAR | Status: AC
  Administered 2020-11-16: 100 ug via INTRAVENOUS
  Filled 2020-11-16: qty 0.5

## 2020-11-16 MED ORDER — HYDROMORPHONE HCL 1 MG/ML IJ SOLN: 1.0000 mg | Freq: Two times a day (BID) | INTRAMUSCULAR | Status: DC | PRN

## 2020-11-16 MED ORDER — DEXTROSE 50 % IV SOLN
INTRAVENOUS | Status: AC
  Filled 2020-11-16: qty 50

## 2020-11-16 MED ORDER — POLYETHYLENE GLYCOL 3350 17 G PO PACK: 17.0000 g | PACK | Freq: Every day | ORAL | Status: DC | PRN

## 2020-11-16 MED ORDER — DEXTROSE 50 % IV SOLN: 25.0000 g | INTRAVENOUS | Status: AC

## 2020-11-16 MED ORDER — ALBUMIN HUMAN 25 % IV SOLN
INTRAVENOUS | Status: AC
  Administered 2020-11-16: 25 g via INTRAVENOUS
  Filled 2020-11-16: qty 100

## 2020-11-16 MED ORDER — DOCUSATE SODIUM 50 MG/5ML PO LIQD: 100.0000 mg | Freq: Two times a day (BID) | ORAL | Status: DC | PRN

## 2020-11-16 NOTE — Progress Notes (Signed)
Assisted tele visit to patient with family member.  Mehek Grega P, RN  

## 2020-11-16 NOTE — Progress Notes (Addendum)
[facility] Progress Note   Complicated with hemorrhagic shock from perf gastric ulcer and has been on and off CRRT multiple times. Multiple GOC conversations with multiple providers + ethics involved as well. Recs were time limited trial of CRRT.    Assessment:   1. HD dep't AKI.CV stopped 11/03/2020.[place] placed on 10/19/20. Family wants ongoing aggressive care, ethics involved, failed multiple transitions to HD secondary to hypotension. Now on MWF IHD and tol gentle UF 2. SVT 3. Hyperkalemia; stable 4. H/o mixed shock: hemorrhagic and septic shock 5. Anemia; CTM, on ESA 6. Perforated duodenal ulcer s/p exlap and graham patch placement 7. 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 ARDS 2/2 COVID-19 Pneumonia 9. A.fib/ DVT 10. Severe calorie malnutrition 11. H/o stage IV colon cancer 12. Hypercalcemia- immobility, stable 13. Metabolic Acidosis, mild, trend with HD  PLAN: - HD today after postponed yesterday -overall poor prognosis  Subjective:   No events overnight HD postponed from yesterday   Objective:   BP 112/65   Pulse (!) 114   Temp 97.9 F (36.6 C) (Oral)   Resp 19   Ht 5' 8"  (1.727 m)   Wt 86 kg   SpO2 100%   BMI 28.83 kg/m   Intake/Output Summary (Last 24 hours) at 11/16/2020 1829 Last data filed at 11/16/2020 0400 Gross per 24 hour  Intake 1355 ml  Output 1875 ml  Net -520 ml   Weight change:   Physical Exam: Gen: lying in bed, chronically ill appearing HEENT: trach in place CVS: tachy Resp: unlabored, trach Abd: soft, nontender Ext: bl ue edema Neuro: follows commands, awake, slow to respond ACCESS: R IJ TDC  Imaging: VAS [place] UPPER EXTREMITY VENOUS DUPLEX  Result Date: 11/15/2020 UPPER VENOUS STUDY  Indications: Edema, and PICC line Limitations: Body habitus, bandages, line and patient's inability to keep arm outstretched. Comparison Study: No prior study Performing Technologist: Sharion Dove RVS  Examination Guidelines: A complete  evaluation includes B-mode imaging, spectral Doppler, color Doppler, and power Doppler as needed of all accessible portions of each vessel. Bilateral testing is considered an integral part of a complete examination. Limited examinations for reoccurring indications may be performed as noted.  Right Findings: +----------+------------+---------+-----------+----------+--------------+ RIGHT     CompressiblePhasicitySpontaneousProperties   Summary     +----------+------------+---------+-----------+----------+--------------+ IJV                                                 Not visualized +----------+------------+---------+-----------+----------+--------------+ Subclavian               Yes       Yes                             +----------+------------+---------+-----------+----------+--------------+ Axillary                 Yes       Yes                             +----------+------------+---------+-----------+----------+--------------+ Brachial      Full       Yes       Yes                             +----------+------------+---------+-----------+----------+--------------+  Cephalic      Full                                                 +----------+------------+---------+-----------+----------+--------------+ Basilic                                             Not visualized +----------+------------+---------+-----------+----------+--------------+  Summary:  Right: No evidence of deep vein thrombosis in the upper extremity. No evidence of superficial vein thrombosis in the visualized veins of the upper extremity.  *See table(s) above for measurements and observations.  Diagnosing physician: Servando Snare MD Electronically signed by Servando Snare MD on 11/15/2020 at 7:23:13 AM.    Final     Labs: BMET Recent Labs  Lab 11/11/20 1610 11/11/20 1743 11/12/20 0124 11/12/20 9604 11/12/20 1741 11/13/20 5409 11/13/20 1709 11/14/20 0456 11/15/20 0543 11/16/20 0456   NA 135   < > 136 134* 136 136 138 139 141 142  K 3.8   < > 2.9* 3.2* 4.2 3.4* 4.0 3.5 3.3* 3.4*  CL 101   < > 100 98 100 100 103 105 106 109  CO2 20*   < > 24 24 24 23 22  20* 18* 17*  GLUCOSE 189*   < > 100* 92 140* 84 134* 82 109* 204*  BUN 112*   < > 31* 39* 59* 71* 94* 106* 139* 160*  CREATININE 1.40*   < > 0.60 0.76 1.12* 1.20* 1.44* 1.62* 1.74* 1.78*  CALCIUM 8.9   < > 8.4* 8.9 9.7 10.0 9.9 9.6 8.8* 8.3*  PHOS 8.8*  --  2.8 4.0  --  7.1*  --   --  9.3*  --    < > = values in this interval not displayed.   CBC Recent Labs  Lab 11/11/20 0351 11/12/20 0124 11/12/20 0352 11/13/20 0353  WBC 10.8* 13.8* 13.6* 11.2*  NEUTROABS 7.1  --   --  7.8*  HGB 6.7* 8.6* 8.5* 8.5*  HCT 21.6* 27.8* 27.5* 27.3*  MCV 95.2 89.1 89.6 89.8  PLT 266 291 294 363    Medications:    . apixaban  2.5 mg Per Tube BID  . B-complex with vitamin C  1 tablet Per Tube Daily  . chlorhexidine gluconate (MEDLINE KIT)  15 mL Mouth Rinse BID  . Chlorhexidine Gluconate Cloth  6 each Topical Daily  . cholecalciferol  2,000 Units Per Tube Daily  . clonazepam  0.5 mg Per Tube TID  . darbepoetin (ARANESP) injection - DIALYSIS  100 mcg Intravenous Q Tue-HD  . docusate  100 mg Per Tube BID  . feeding supplement (PROSource TF)  90 mL Per Tube TID  . fentaNYL  1 patch Transdermal Q72H  . guaiFENesin  15 mL Per Tube Q12H  .  HYDROmorphone (DILAUDID) injection  1 mg Intravenous Q8H  . influenza vaccine adjuvanted  0.5 mL Intramuscular Tomorrow-1000  . insulin aspart  0-15 Units Subcutaneous Q4H  . insulin aspart  4 Units Subcutaneous Q4H  . insulin glargine  20 Units Subcutaneous BID  . mouth rinse  15 mL Mouth Rinse QID  . midodrine  40 mg Per Tube TID WC  . nutrition supplement (JUVEN)  1 packet Per Tube BID BM  .  pantoprazole sodium  40 mg Per Tube BID  . polyethylene glycol  17 g Per Tube Daily  . QUEtiapine  100 mg Per Tube QHS  . QUEtiapine  50 mg Per Tube Daily  . sodium chloride flush  10-40 mL  Intracatheter Q12H      Rexene Agent, MD 11/16/2020, 9:21 AM

## 2020-11-16 NOTE — Progress Notes (Signed)
NAMEAmerika [name], MRN:  [id number], DOB:  Sep 26, 1951, LOS: 76 ADMISSION DATE:  08/31/2020, CONSULTATION DATE:  09/07/2020 REFERRING MD:  Dr Candace [name], CHIEF COMPLAINT:  Acute resp failure  Brief History   70 year old female who was previously diagnosed with Covid 08/23/2020.  Admitted 11/2 with AF-RVR, found to have a perforated duodenal underwent exploratory laparotomy with Phillip Heal patch placement 11/3.  Past Medical History  Covid pneumonia Atrial fibrillation CKD stage III Diabetes mellitus Hypertension Colon cancer Hyperlipidemia  Significant Hospital Events   11/2 Admitted  11/3 OR with findings of perforated duodenal ulcer 11/10 progressive hemorrhagic shock, intubated, transfused, pressors, proned; started on CRRT in PM 11/03 Exploratory laparotomy, Phillip Heal patch, lysis of adhesion for duodenal ulceration postop day 6 11/16 Extubated. Re-intubated overnight due to respiratory distress and hypoxia with decreased mentation 11/18 Bronch, cultures sent 11/19 Hgb down getting blood 11/24 Spiked fever resume empirical antimicrobial therapy 11/26 Hemorrhagic shock, hgb 5.6, increased pressors, CT A/P  11/30 Per palliative "Candace [name] expresses understanding that patient is unfortunately very fragile despite ongoing intensive medical treatment and full mechanical support. She indicates that the family wants to continue with all current interventions despite potential outcomes". 12/08 CRRT discontinued due to clotting 12/09 Family requested transfer to tertiary care [facility]). [facility] denied transfer  12/10 CRRT restarted. Episodes of tachycardia, tachypnea that seem to improve with pain management 12/11 Back in shock. Pressor requirements up. CXR worse. ABX resumed 12/12 Still requiring inc pressors. Had hypoglycemic event. Basal insulin dosing adjusted 12/13 Pressor requirements better. Now hyperglycemic. Re-adjusted Glycemic control  12/14 Changed dilaudid to1/2 dosing from day further. D/c  vasopressin.  Goals of care reconfirmed with daughter.  Patient continues to desire aggressive care.  Not open to discussing any other option, patient family continues to be hopeful that she will be discharged to home with full recovery in spite of multiple attempts by staff to prepare them that this is unlikely scenario 12/15 Dilaudid discontinued sending cortisol for ongoing pressor dependence 12/16 Serum cortisol <20, added stress dose steroids.  PRN Dilaudid, attempting not go back on Dilaudid infusion 12/17 Developed worsening tachycardia during the evening hours received initially IV albumin, followed by resuming IV Dilaudid with question of suboptimal pain control.  Currently looks better back on Dilaudid drip.  We have been able to wean pressors after adding stress dose steroids; near arrest - bradycardia, better with atropine  12/19 Afebrile . Remains on dilaudid and heparin gtt, dilaudid gtt increased overnight for concern of pain / ongoing tachycardia, no other events . NE and precedex off 12/17. Ongoing CRRT- even UF, dosing lokelmia this morning 12/20 On CRRT.  Renal plans for HD cath and moving to HD. Getting wound care  12/23 -No vent weaning per RT, remains on full support, #8 trach in place but previously had #6, no documentation of change noted in chart Afebrile / WBC 20.4  Vent - 30% FiO2, PEEP 5 Glucose range 176-212 I/O 465 ml stool, 3.6L removed with HD, -1.1L in last 24 hours  RN reports ongoing periods of tachypnea / vent dyssynchrony that responds to dilaudid   12/24 - renal stopping CRRT today and plans HD 10/24/20 . 40% fio2 on vent via Trach. TAchypenic and tachycardic. Afebrile but wbc up to 27.6K. On TF. Onn heparin gtt  12/25 - Back on CRRT. On vent via trach at 40% fio2, On scheduled dilaudid as add on to oxy. Per family request 12/24 - they felt scheduled oxy was not adequate and patient was showing  signs of opioid withdrawal.  Patient also had  worsening SIRS/sepsis  syndrome. Had fever, rising wbc, worsening RR 40-60 and HR 140s sinus-> started On abx yesterday. Fever some better today. WBC plateau at 28,.5K. On new levophed -> signifanct improvement in HR 77 and RR t0 20. On heparin gtt.  On precedex gtt. On levophed gtt 5mcg wthi midodrine. On TF 12/30 Remains on CRRT with intermittent pressor requirements. Ethics consult placed evening of 12/29. Ethics rec time trial of CRRT 12/31 failed SBT with RR 40s. Several conversations between care tam and family, who are upset RE plan of care 1/1 back on pressors  1/4: On pressors, keeping even on CVVHD, HGB drop to 6.9, transfused 1 unit. Improving mental status 1/10 remains on low dose levophed 79mcg 1/11 off levo, attempting HD with UF for first time.   Consults:  Cardiology [facility] Nephrology  Ethics  Procedures:  R PICC 11/5 >> A line 11/9 >> out ETT 11/9 > 11/16, 11/16 >> 09/21/2020, 09/21/2020 tracheostomy>> Lt [place] CVL 11/9 >> R IJ trialysis >> out HD catheter 12/1 >>12/20 12/21 - 14.5 Fr, 23 cm right IJ tunneled hemodialysis catheter placement.  Removal of indwelling subclavian catheter.   Significant Diagnostic Tests:  11/3 CT abd/ pelvis > 1. Positive for bowel perforation: Pneumoperitoneum and intermediate density free fluid in the abdomen. Prior total colectomy. The specific site of perforation is unclear-oral contrast present to the proximal jejunum has not obviously leaked. Note that there may be small bowel loops adherent to the ventral abdominal wall along the greater curve of the stomach. 2. Extensive bilateral lower lung pneumonia. No pleural effusion. 3. Other abdominal and pelvic viscera are stable since 2015, including bilateral adrenal adenomas. Chronic renal parapelvic cysts. 4. Aortic Atherosclerosis 11/3 TTE > EF 70-75%, RV not well visualized, mildly reduced RV systolic function 50/27 CT chest/ abd/ pelvis> 1. Interval progression of diffuse bilateral hazy ground-glass airspace  opacities with more focal areas of consolidation at the lung bases 2. Trace bilateral pleural effusions. 3. Postsurgical changes the abdomen as detailed above. No evidence for a postoperative abscess, however evaluation is limited by lack of IV contrast. 4. There is a 1.9 cm cystic appearing lesion located in the pancreatic body. This was not present on the patient's CT from 2015.  Follow-up with an outpatient contrast enhanced MRI is recommended. 5. The endometrial stripe appears diffusely thickened. Follow-up with pelvic ultrasound is recommended. Aortic Atherosclerosis 11/14 LE doppler studies > + DVT of right posterior tibial and peroneal vein, +dVT of left posterior tibial vein  11/26 CT ABD PEL > liver unremarkable, distended gallbladder with layering tiny gallstones versus sludge, no duct dilatation, mild hyperdensity right upper pole renal collecting system new.  No evidence of retroperitoneal bleeding.  Multifocal lower lobe predominant pulmonary infiltrates/pneumonia.  Small left pleural effusion. 1/16 Venous duplex RUE >> negative Micro Data:  11/10 MRSA PCR > neg 11/10 BC x 2 > neg 09/22/2020 blood cultures x2>> S epi.  09/22/2020 sputum culture>> MRSA Blood 12/1 >> negative. S.epi 1 out of 2, likely contaminant BCx 2 12/12 >> neg xxx Trach aspirate 12/24 - FEW STAPHYLOCOCCUS AUREUS  Blood 12/24 > Negative  Blood 1/2: neg Sputum 1/4: MSSA  Antimicrobials:  azithro 11/2 >11/3 Ceftriaxone 11/2  Fluconazole 11/3 Zosyn 11/3 >> 11/7 Vanc 11/10 off Cefepime 11/10 > 11/16 09/22/2020 vancomycin for MRSA PNA >> 12/2 xxxx Vanc 12/11> 12/17 Zosyn 12/11> 12/17 xxxx vanc 12/24 (staph resp cutlure) >> 12/28 Zosyn 12/24 - 12/27 Cefazolin  12/27 >>1/1 linezolid 1/2 eraxis 1/2  Meropenem 1/2 > 1/8  Subjective:   Plan for hemodialysis today  Objective   Blood pressure 112/65, pulse (!) 114, temperature 97.9 F (36.6 C), temperature source Oral, resp. rate 19, height 5\' 8"   (1.727 m), weight 86 kg, SpO2 100 %.    FiO2 (%):  [40 %] 40 %   Intake/Output Summary (Last 24 hours) at 11/16/2020 0928 Last data filed at 11/16/2020 0400 Gross per 24 hour  Intake 1355 ml  Output 1875 ml  Net -520 ml   Filed Weights   11/09/20 0645 11/09/20 0949 11/16/20 0430  Weight: 87 kg 86.5 kg 86 kg   Physical Exam  General: Frail female currently on trach collar not in acute distress HEENT: Trach collar and trach unremarkable.  Facial edema is noted Neuro: Follows simple commands CV: Sinus tach 118 PULM: Trach collar decreased breath sounds throughout.  Has been on trach collar for 72 hours. GI: soft, bsx4 faint, ostomy bag in place drainage GU: Extremities: warm/dry, 2+ edema, right extremity remains larger than left recent ultrasound reveals no DVT Skin: Sacral decubitus per wound ostomy care   .   Resolved problems:  Previously hemorrhagic shock requiring PRBCs, Septic shock, MRSA pneumonia s/p 8 days abx Complicated by MRSA healthcare associated pneumonia (treated) Peritonitis and perforated ulcer post repair 09/01/20 MSSA HAP  Assessment:  Candace [name] is a 70 y.o. with history of COVID 19 infection requiring hospitalization in October 2021 with current admission for hemorrhagic shock secondary to perforated gastric ulcer requiring surgical intervention and [place]. Her medical issues are as follows:  Acute metabolic encephalopathy : Prolonged critical illness, sedation/pain Slowly improving Off Precedex since 11/11/2018 Currently on scheduled Klonopin, fentanyl patch Seroquel Slowly decreasing frequency of Dilaudid changed to every 12 hours 11/16/2020     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 status post tracheostomy Stable bilateral pneumonia VDRF -Continue SBT as she can tolerate, either PSV or ATC.  Currently on ATC this morning -Pulmonary hygiene  Persistent vasoplegia vs circulatory shock.   Persistent SVT, sinus tachycardia Off vasopressors and amiodarone On high-dose midodrine As needed beta-blocker  Renal failure Lab Results  Component Value Date   CREATININE 1.78 (H) 11/16/2020   CREATININE 1.74 (H) 11/15/2020   CREATININE 1.62 (H) 11/14/2020    Appreciate nephrology's input Scheduled for intermittent hemodialysis 11/16/2020  Afib/DVT lower extremity Asymmetric right upper extremity edema/swelling negative ultrasound Continue Eliquis   Anemia No results for input(s): HGB in the last 72 hours. No obvious bleeding, suspect nosocomial losses with chronic disease Transfuse per protocol IV iron started 11/15/2020  Critical Illness Myopathy Severe deconditioning  Inadequate PO intake   Push nutrition as able Enteral tube feedings Physical therapy as tolerated    DM2 with hyperglycemia CBG (last 3)  Recent Labs    11/15/20 2335 11/16/20 0442 11/16/20 0755  GLUCAP 136* 182* 158*     Scale insulin protocol NovoLog and Lantus as scheduled    Stage IV sacral ulcer Appreciate wound ostomy care input  History of stage IV colon cancer 1.9 cm cystic-appearing lesion located in pancreatic body no intervention at this time.  Outpatient evaluation if  survives  multiple [place] conversations with various PCCM providers and family have not yielded changes in scope of tx, multiple PCCM providers have been fired by family.  -concerns for futility of certain interventions -ethics consulted 12/30-- recs for time limited trial of CRRT, please see separate Ethics consult  note for full details. Futility policy reviewed with family by ethics. Ongoing meetings with Dr. Hulen Skains with nursing administration and family. The ultimate goal is to transition her safely to HD from CRRT. 11/16/2020 again will be placed on intermittent hemodialysis. 11/16/2018 wheeze criteria for transition to progressive care since she has been off the ventilator for 72 hours. With an order for  transfer to progressive care.  Best practice:  Diet: EN  Pain/Anxiety/Delirium protocol (if indicated): Precedex was discontinued 11/11/2020 continues to have fentanyl patch and Dilaudid scheduled VAP protocol (if indicated): yes DVT prophylaxis: Eliquis GI prophylaxis: Protonix Glucose control: SSI, TF coverage, lantus Mobility: PT Family communication: Daughter updated daily updated 11/16/2020 Independent APP CCT 39 min  Richardson Landry Rafia Shedden ACNP Acute Care Nurse Practitioner [place] Please consult Amion 11/16/2020, 11:33 AM

## 2020-11-17 DIAGNOSIS — I4891 Unspecified atrial fibrillation: Secondary | ICD-10-CM | POA: Diagnosis not present

## 2020-11-17 LAB — GLUCOSE, CAPILLARY
Glucose-Capillary: 132 mg/dL — ABNORMAL HIGH (ref 70–99)
Glucose-Capillary: 142 mg/dL — ABNORMAL HIGH (ref 70–99)
Glucose-Capillary: 132 mg/dL — ABNORMAL HIGH (ref 70–99)
Glucose-Capillary: 143 mg/dL — ABNORMAL HIGH (ref 70–99)
Glucose-Capillary: 160 mg/dL — ABNORMAL HIGH (ref 70–99)
Glucose-Capillary: 164 mg/dL — ABNORMAL HIGH (ref 70–99)
Glucose-Capillary: 202 mg/dL — ABNORMAL HIGH (ref 70–99)

## 2020-11-17 LAB — CBC
HCT: 28.1 % — ABNORMAL LOW (ref 36.0–46.0)
Hemoglobin: 8.3 g/dL — ABNORMAL LOW (ref 12.0–15.0)
MCH: 26.8 pg (ref 26.0–34.0)
MCHC: 29.5 g/dL — ABNORMAL LOW (ref 30.0–36.0)
MCV: 90.6 fL (ref 80.0–100.0)
Platelets: 311 10*3/uL (ref 150–400)
RBC: 3.1 MIL/uL — ABNORMAL LOW (ref 3.87–5.11)
RDW: 17.4 % — ABNORMAL HIGH (ref 11.5–15.5)
WBC: 8.9 10*3/uL (ref 4.0–10.5)
nRBC: 0 % (ref 0.0–0.2)

## 2020-11-17 LAB — BASIC METABOLIC PANEL
Anion gap: 10 (ref 5–15)
BUN: 80 mg/dL — ABNORMAL HIGH (ref 8–23)
CO2: 24 mmol/L (ref 22–32)
Calcium: 8.7 mg/dL — ABNORMAL LOW (ref 8.9–10.3)
Chloride: 104 mmol/L (ref 98–111)
Creatinine, Ser: 0.95 mg/dL (ref 0.44–1.00)
GFR, Estimated: >60 mL/min (ref >60)
Glucose, Bld: 163 mg/dL — ABNORMAL HIGH (ref 70–99)
Potassium: 3.6 mmol/L (ref 3.5–5.1)
Sodium: 138 mmol/L (ref 135–145)

## 2020-11-17 LAB — MAGNESIUM: Magnesium: 1.5 mg/dL — ABNORMAL LOW (ref 1.7–2.4)

## 2020-11-17 LAB — PHOSPHORUS: Phosphorus: 4.1 mg/dL (ref 2.5–4.6)

## 2020-11-17 MED ORDER — INSULIN ASPART 100 UNIT/ML ~~LOC~~ SOLN
0.0000 [IU] | SUBCUTANEOUS | Status: DC
  Administered 2020-11-17: 2 [IU] via SUBCUTANEOUS
  Administered 2020-11-17: 1 [IU] via SUBCUTANEOUS
  Administered 2020-11-17: 2 [IU] via SUBCUTANEOUS
  Administered 2020-11-18: 1 [IU] via SUBCUTANEOUS
  Administered 2020-11-18: 3 [IU] via SUBCUTANEOUS
  Administered 2020-11-18: 2 [IU] via SUBCUTANEOUS

## 2020-11-17 MED ORDER — FREE WATER
100.0000 mL | Status: DC
  Administered 2020-11-17 – 2020-11-18 (×6): 100 mL

## 2020-11-17 MED ORDER — METOPROLOL TARTRATE 25 MG/10 ML ORAL SUSPENSION
12.5000 mg | Freq: Two times a day (BID) | ORAL | Status: DC
  Administered 2020-11-17 – 2020-11-18 (×3): 12.5 mg via ORAL
  Filled 2020-11-17 (×3): qty 10

## 2020-11-17 MED ORDER — COLLAGENASE 250 UNIT/GM EX OINT
TOPICAL_OINTMENT | Freq: Two times a day (BID) | CUTANEOUS | Status: DC
  Administered 2020-11-28 – 2020-12-16 (×6): 1 via TOPICAL
  Filled 2020-11-17 (×4): qty 30

## 2020-11-17 MED ORDER — FLUCONAZOLE 40 MG/ML PO SUSR
200.0000 mg | Freq: Once | ORAL | Status: AC
  Administered 2020-11-17: 200 mg via ORAL
  Filled 2020-11-17: qty 5

## 2020-11-17 MED ORDER — MAGNESIUM OXIDE 400 (241.3 MG) MG PO TABS
400.0000 mg | ORAL_TABLET | Freq: Two times a day (BID) | ORAL | Status: AC
  Administered 2020-11-17 – 2020-11-19 (×6): 400 mg via ORAL
  Filled 2020-11-17 (×6): qty 1

## 2020-11-17 MED ORDER — INSULIN GLARGINE 100 UNIT/ML ~~LOC~~ SOLN
15.0000 [IU] | Freq: Two times a day (BID) | SUBCUTANEOUS | Status: DC
  Administered 2020-11-17 – 2020-11-18 (×2): 15 [IU] via SUBCUTANEOUS
  Filled 2020-11-17 (×3): qty 0.15

## 2020-11-17 NOTE — Plan of Care (Signed)
  Problem: Education: Goal: Utilization of techniques to improve thought processes will improve Outcome: Progressing Goal: Knowledge of the prescribed therapeutic regimen will improve Outcome: Progressing   Problem: Activity: Goal: Interest or engagement in leisure activities will improve Outcome: Progressing Goal: Imbalance in normal sleep/wake cycle will improve Outcome: Progressing   Problem: Coping: Goal: Coping ability will improve Outcome: Progressing Goal: Will verbalize feelings Outcome: Progressing   Problem: Health Behavior/Discharge Planning: Goal: Ability to make decisions will improve Outcome: Progressing Goal: Compliance with therapeutic regimen will improve Outcome: Progressing   Problem: Role Relationship: Goal: Will demonstrate positive changes in social behaviors and relationships Outcome: Progressing   Problem: Safety: Goal: Ability to identify and utilize support systems that promote safety will improve Outcome: Progressing   Problem: Self-Concept: Goal: Level of anxiety will decrease Outcome: Progressing   Problem: Education: Goal: Knowledge of General Education information will improve Description: Including pain rating scale, medication(s)/side effects and non-pharmacologic comfort measures Outcome: Progressing   Problem: Health Behavior/Discharge Planning: Goal: Ability to manage health-related needs will improve Outcome: Progressing   Problem: Clinical Measurements: Goal: Ability to maintain clinical measurements within normal limits will improve Outcome: Progressing Goal: Will remain free from infection Outcome: Progressing Goal: Diagnostic test results will improve Outcome: Progressing Goal: Respiratory complications will improve Outcome: Progressing Goal: Cardiovascular complication will be avoided Outcome: Progressing   Problem: Activity: Goal: Risk for activity intolerance will decrease Outcome: Progressing   Problem:  Nutrition: Goal: Adequate nutrition will be maintained Outcome: Progressing   Problem: Coping: Goal: Level of anxiety will decrease Outcome: Progressing   Problem: Elimination: Goal: Will not experience complications related to bowel motility Outcome: Progressing Goal: Will not experience complications related to urinary retention Outcome: Progressing   Problem: Pain Managment: Goal: General experience of comfort will improve Outcome: Progressing   Problem: Safety: Goal: Ability to remain free from injury will improve Outcome: Progressing   Problem: Skin Integrity: Goal: Risk for impaired skin integrity will decrease Outcome: Progressing   Problem: Activity: Goal: Ability to tolerate increased activity will improve Outcome: Progressing   Problem: Respiratory: Goal: Ability to maintain a clear airway and adequate ventilation will improve Outcome: Progressing   Problem: Role Relationship: Goal: Method of communication will improve Outcome: Progressing   Problem: Education: Goal: Knowledge of disease and its progression will improve Outcome: Progressing   Problem: Fluid Volume: Goal: Compliance with measures to maintain balanced fluid volume will improve Outcome: Progressing   Problem: Health Behavior/Discharge Planning: Goal: Ability to manage health-related needs will improve Outcome: Progressing   Problem: Nutritional: Goal: Ability to make healthy dietary choices will improve Outcome: Progressing   Problem: Clinical Measurements: Goal: Complications related to the disease process, condition or treatment will be avoided or minimized Outcome: Progressing   Problem: Education: Goal: Knowledge of risk factors and measures for prevention of condition will improve Outcome: Progressing   Problem: Coping: Goal: Psychosocial and spiritual needs will be supported Outcome: Progressing   Problem: Respiratory: Goal: Will maintain a patent airway Outcome:  Progressing Goal: Complications related to the disease process, condition or treatment will be avoided or minimized Outcome: Progressing

## 2020-11-17 NOTE — Progress Notes (Signed)
Pt refuse wound card for ABD ,Pt also refuse mouth care. RN provided education on the importance on wound care, and the importance of mouth care to prevent infection.

## 2020-11-17 NOTE — Progress Notes (Signed)
[facility] Progress Note  77 Days Post-Op  Subjective: CC-  Patient transferred out of the ICU yesterday. She is currently receiving all of her nutrition via tube feedings through [facility]. She worked with SLP for the first time this morning and is set up for [facility] tomorrow. General surgery asked to consider G tube placement.  Also asked to evaluate sacral wound. She is currently undergoing BID wet to dry dressing changes. Hydrotherapy was stopped 8 days ago.  Objective: Vital signs in last 24 hours: Temp:  [97.8 F (36.6 C)-99 F (37.2 C)] 98.3 F (36.8 C) (01/19 0335) Pulse Rate:  [99-127] 127 (01/19 0950) Resp:  [18-44] 20 (01/19 0950) BP: (105-128)/(56-85) 114/78 (01/19 0950) SpO2:  [98 %-100 %] 99 % (01/19 0950) FiO2 (%):  [35 %-40 %] 35 % (01/19 0353) Weight:  [85 kg-92.6 kg] 92.6 kg (01/18 2120) Last BM Date: 11/16/20  Intake/Output from previous day: 01/18 0701 - 01/19 0700 In: 325 [I.V.:15; NG/GT:310] Out: 2383 [Urine:550; Emesis/NG output:70; Stool:725] Intake/Output this shift: No intake/output data recorded.  PE: Gen:  Alert, NAD, confused HEENT: trach in place with PMV Card: tachycardic Pulm:  rate and effort normal Abd: Soft, NT/ND, ostomy productive, midline incision C/D/I with healthy granulation tissue   Sacral wound: large sacral wound with about 90% nonviable tissue, small amount of healthy granulation tissue, no purulent drainage or cellulitis, no significant tunneling       Lab Results:  Recent Labs    11/17/20 0601  WBC 8.9  HGB 8.3*  HCT 28.1*  PLT 311   BMET Recent Labs    11/16/20 0456 11/17/20 0601  NA 142 138  K 3.4* 3.6  CL 109 104  CO2 17* 24  GLUCOSE 204* 163*  BUN 160* 80*  CREATININE 1.78* 0.95  CALCIUM 8.3* 8.7*   PT/INR No results for input(s): LABPROT, INR in the last 72 hours. CMP     Component Value Date/Time   NA 138 11/17/2020 0601   K 3.6 11/17/2020 0601   CL 104 11/17/2020 0601   CO2 24  11/17/2020 0601   GLUCOSE 163 (H) 11/17/2020 0601   BUN 80 (H) 11/17/2020 0601   CREATININE 0.95 11/17/2020 0601   CALCIUM 8.7 (L) 11/17/2020 0601   PROT 8.0 11/12/2020 0124   ALBUMIN 2.7 (L) 11/12/2020 0124   AST 26 11/12/2020 0124   ALT 19 11/12/2020 0124   ALKPHOS 156 (H) 11/12/2020 0124   BILITOT 0.5 11/12/2020 0124   GFRNONAA >60 11/17/2020 0601   Lipase     Component Value Date/Time   LIPASE 48 08/31/2020 2054       Studies/Results: No results found.  Anti-infectives: Anti-infectives (From admission, onward)   Start     Dose/Rate Route Frequency Ordered Stop   11/03/20 2200  meropenem (MERREM) 500 mg in sodium chloride 0.9 % 100 mL IVPB        500 mg 200 mL/hr over 30 Minutes Intravenous Every 24 hours 11/03/20 1500 11/06/20 2217   11/01/20 1000  anidulafungin (ERAXIS) 100 mg in sodium chloride 0.9 % 100 mL IVPB  Status:  Discontinued       "Followed by" Linked Group Details   100 mg 78 mL/hr over 100 Minutes Intravenous Every 24 hours 10/31/20 0916 11/01/20 1404   10/31/20 1015  meropenem (MERREM) 1 g in sodium chloride 0.9 % 100 mL IVPB  Status:  Discontinued        1 g 200 mL/hr over 30 Minutes Intravenous Every 8  hours 10/31/20 0916 11/03/20 1500   10/31/20 1015  linezolid (ZYVOX) IVPB 600 mg  Status:  Discontinued        600 mg 300 mL/hr over 60 Minutes Intravenous Every 12 hours 10/31/20 0916 11/02/20 0906   10/31/20 1015  anidulafungin (ERAXIS) 200 mg in sodium chloride 0.9 % 200 mL IVPB       "Followed by" Linked Group Details   200 mg 78 mL/hr over 200 Minutes Intravenous  Once 10/31/20 0916 10/31/20 1630   10/25/20 1800  ceFAZolin (ANCEF) IVPB 2g/100 mL premix  Status:  Discontinued        2 g 200 mL/hr over 30 Minutes Intravenous Every 12 hours 10/25/20 1022 10/30/20 1103   10/23/20 2000  vancomycin (VANCOREADY) IVPB 750 mg/150 mL  Status:  Discontinued        750 mg 150 mL/hr over 60 Minutes Intravenous Every 24 hours 10/22/20 2036 10/25/20 1022    10/23/20 1800  piperacillin-tazobactam (ZOSYN) IVPB 3.375 g  Status:  Discontinued        3.375 g 100 mL/hr over 30 Minutes Intravenous Every 6 hours 10/23/20 1155 10/24/20 1422   10/23/20 0200  piperacillin-tazobactam (ZOSYN) IVPB 3.375 g  Status:  Discontinued        3.375 g 100 mL/hr over 30 Minutes Intravenous Every 8 hours 10/22/20 2036 10/23/20 1155   10/22/20 1630  vancomycin (VANCOREADY) IVPB 1500 mg/300 mL        1,500 mg 150 mL/hr over 120 Minutes Intravenous  Once 10/22/20 1537 10/22/20 2229   10/22/20 1630  piperacillin-tazobactam (ZOSYN) IVPB 2.25 g  Status:  Discontinued        2.25 g 100 mL/hr over 30 Minutes Intravenous Every 8 hours 10/22/20 1537 10/22/20 2036   10/22/20 1537  vancomycin variable dose per unstable renal function (pharmacist dosing)  Status:  Discontinued         Does not apply See admin instructions 10/22/20 1537 10/22/20 2036   10/19/20 0600  ceFAZolin (ANCEF) IVPB 2g/100 mL premix        2 g 200 mL/hr over 30 Minutes Intravenous To Radiology 10/18/20 1457 10/19/20 0938   10/10/20 0830  vancomycin (VANCOCIN) IVPB 1000 mg/200 mL premix       "Followed by" Linked Group Details   1,000 mg 200 mL/hr over 60 Minutes Intravenous Every 24 hours 10/09/20 0744 10/15/20 0902   10/09/20 0830  piperacillin-tazobactam (ZOSYN) IVPB 3.375 g        3.375 g 100 mL/hr over 30 Minutes Intravenous Every 6 hours 10/09/20 0744 10/16/20 0038   10/09/20 0830  vancomycin (VANCOREADY) IVPB 2000 mg/400 mL       "Followed by" Linked Group Details   2,000 mg 200 mL/hr over 120 Minutes Intravenous  Once 10/09/20 0744 10/09/20 1022   09/24/20 1000  ceFAZolin (ANCEF) IVPB 2g/100 mL premix  Status:  Discontinued        2 g 200 mL/hr over 30 Minutes Intravenous Every 12 hours 09/24/20 0801 09/24/20 1046   09/23/20 1400  vancomycin (VANCOCIN) IVPB 1000 mg/200 mL premix        1,000 mg 200 mL/hr over 60 Minutes Intravenous Every 24 hours 09/22/20 1436 09/28/20 1718   09/22/20 2200   ceFEPIme (MAXIPIME) 2 g in sodium chloride 0.9 % 100 mL IVPB  Status:  Discontinued        2 g 200 mL/hr over 30 Minutes Intravenous Every 12 hours 09/22/20 1436 09/24/20 0801   09/22/20 1030  ceFEPIme (MAXIPIME)  1 g in sodium chloride 0.9 % 100 mL IVPB        1 g 200 mL/hr over 30 Minutes Intravenous  Once 09/22/20 0934 09/22/20 1145   09/22/20 1015  vancomycin (VANCOCIN) IVPB 1000 mg/200 mL premix        1,000 mg 200 mL/hr over 60 Minutes Intravenous  Once 09/22/20 0934 09/22/20 1446   09/12/20 2200  ceFEPIme (MAXIPIME) 2 g in sodium chloride 0.9 % 100 mL IVPB        2 g 200 mL/hr over 30 Minutes Intravenous Every 12 hours 09/12/20 0732 09/14/20 2134   09/11/20 1400  ceFEPIme (MAXIPIME) 2 g in sodium chloride 0.9 % 100 mL IVPB  Status:  Discontinued        2 g 200 mL/hr over 30 Minutes Intravenous Every 8 hours 09/11/20 1126 09/12/20 0732   09/08/20 1000  vancomycin (VANCOREADY) IVPB 2000 mg/400 mL        2,000 mg 200 mL/hr over 120 Minutes Intravenous  Once 09/08/20 0857 09/08/20 1224   09/08/20 1000  ceFEPIme (MAXIPIME) 2 g in sodium chloride 0.9 % 100 mL IVPB  Status:  Discontinued        2 g 200 mL/hr over 30 Minutes Intravenous Every 12 hours 09/08/20 0857 09/11/20 1126   09/08/20 0856  vancomycin variable dose per unstable renal function (pharmacist dosing)  Status:  Discontinued         Does not apply See admin instructions 09/08/20 0857 09/09/20 0935   09/02/20 1600  cefTRIAXone (ROCEPHIN) 1 g in sodium chloride 0.9 % 100 mL IVPB  Status:  Discontinued        1 g 200 mL/hr over 30 Minutes Intravenous Every 24 hours 09/01/20 1811 09/02/20 0838   09/01/20 1800  fluconazole (DIFLUCAN) IVPB 400 mg        400 mg 50 mL/hr over 240 Minutes Intravenous  Once 09/01/20 1749 09/02/20 0603   09/01/20 1530  piperacillin-tazobactam (ZOSYN) IVPB 3.375 g        3.375 g 12.5 mL/hr over 240 Minutes Intravenous Every 8 hours 09/01/20 1514 09/05/20 2111   09/01/20 1000  levofloxacin (LEVAQUIN)  tablet 250 mg  Status:  Discontinued        250 mg Oral Daily 08/31/20 1508 08/31/20 1735   08/31/20 1730  cefTRIAXone (ROCEPHIN) 1 g in sodium chloride 0.9 % 100 mL IVPB  Status:  Discontinued        1 g 200 mL/hr over 30 Minutes Intravenous Every 24 hours 08/31/20 1726 09/01/20 1513   08/31/20 1730  azithromycin (ZITHROMAX) 500 mg in sodium chloride 0.9 % 250 mL IVPB  Status:  Discontinued        500 mg 250 mL/hr over 60 Minutes Intravenous Every 24 hours 08/31/20 1726 09/02/20 0838       Assessment/Plan HTN HLD DM Hx colon cancer s/p partial colectomy/colostomy followed by completion colectomy and ileostomy Acute respiratory failure due to COVID-19 pneumonia: s/p tracheostomy, Multiple bacterial pneumonias A fib RVR - now on eliquis LE DVT - on eliquis 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 She just worked with SLP for the first time today and is scheduled for [facility] tomorrow. Would not recommend G tube at this time. Recommend giving her more time to work with SLP and see if she will ultimately be able to  advance her diet. She is now 87 days out from surgery, but would still be at somewhat higher risk of reperforation with insufflation for PEG tube.  Sacral wound  - Would not recommend taking this patient to the OR for general anesthesia and surgical debridement at this time. Tissue is nonviable but she has no signs of infection. Add santyl to BID wet to dry dressing changes. Will ask PT to see again for hydrotherapy.  We will follow.   Pneumoperitoneum,perforated duodenal ulcer S/pexploratory laparotomy, adhesiolysis, modified graham patch repair of perforated duodenal ulcer11/3 Dr. Bobbye Morton -POD#77 -tolerating TF - BID wet to dry dressing changes to midline abdominal  wound  ID - currently none FEN - TF, NPO VTE - eliquis Foley - none Follow up - Dr. Bobbye Morton   LOS: 14 days    Wellington Hampshire, [facility] 11/17/2020, 11:19 AM Please see Amion for pager number during day hours 7:00am-4:30pm

## 2020-11-17 NOTE — Consult Note (Signed)
[place] Nurse wound follow up Patient receiving care in [place]. Wound type: Unstageable PI to sacrum Measurement: 13 cm x 15 cm x unknown true depth (due to heavy non-viable tissue in wound) Wound bed: 99% thick brown, non-viable Drainage (amount, consistency, odor) none Periwound: intact Dressing procedure/placement/frequency: Place saline moistened gauze into the sacral wound; be sure to fill the entire wound. Cover with ABD pad. Change BID I have recommended to Dr. Raelyn Mora to ask surgical services for eval for debridement.  He has agreed to do so. The wound has not improved despite hydrotherapy, enzymatic debridement with santyl, and Dakin's solution.  If surgery declines debridement, I will ask PT to consider resumption of hydrotherapy. Val Riles, RN, MSN, CWOCN, CNS-BC, pager [phone]

## 2020-11-17 NOTE — Progress Notes (Signed)
Physical Therapy Re-evaluation Patient Details Name: Candace [name] MRN: [id number] DOB: 1951-05-16 Today's Date: 11/17/2020    History of Present Illness 70 y.o. female with medical history significant for recent covid pna, obesity, colon cancer s/p colon resection with colostomy bag, HLD, NIDDM2, CKD3 presented to ED after her follow-up nurse advised her to present to ED for elevated HR. +afib, elevated troponins with demand ischemia,  CT scan is positive for bowel perforation with pneumomediastinum 11/03 exp lap with repair of perforated ulcer. 11/10 t/f to ICU- intubated/sedated/proned. Trach placed 11/24. CRRT off 12/8, restarted 12/10-1/5.    PT Comments    Pt presents to PT more alert and better able to participate with acute PT services. Pt does initiate functional mobility with all extremities but continues to require significant physical assistance to perform all functional mobility and to maintain sitting balance due to profound weakness. PT provides continues encouragement of AROM as well as continued PROM by pt's daughter as able. Pt will benefit from continued acute PT POC to improve strength and functional mobility quality, and to also reduce caregiver burden. PT recommends LTACH vs SNF at the time of discharge pending the pt's medical needs.   Follow Up Recommendations  SNF;LTACH (SNF vs LTACH pending medical need)     Equipment Recommendations  Wheelchair (measurements PT);Wheelchair cushion (measurements PT);Hospital bed;Other (comment) (mechanical lift)    Recommendations for Other Services OT consult     Precautions / Restrictions Precautions Precautions: Fall Precaution Comments: baseline colostomy; sacral wound Restrictions Weight Bearing Restrictions: No    Mobility  Bed Mobility Overal bed mobility: Needs Assistance Bed Mobility: Supine to Sit;Sit to Supine     Supine to sit: Total assist;HOB elevated Sit to supine: Total assist;HOB elevated   General bed  mobility comments: pt initiates mobility with all extremities but is profoundly weak  Transfers                    Ambulation/Gait                 Stairs             Wheelchair Mobility    Modified Rankin (Stroke Patients Only)       Balance Overall balance assessment: Needs assistance Sitting-balance support: Bilateral upper extremity supported;Feet unsupported Sitting balance-Leahy Scale: Zero Sitting balance - Comments: maxA with BUE support of bed Postural control: Posterior lean                                  Cognition Arousal/Alertness: Awake/alert Behavior During Therapy: Flat affect Overall Cognitive Status: Difficult to assess Area of Impairment: Orientation;Attention;Memory;Following commands;Safety/judgement;Awareness;Problem solving                 Orientation Level: Disoriented to;Time Current Attention Level: Focused   Following Commands: Follows one step commands with increased time (initially not following commands but participates with continued encouragement from PT and daughter)     Problem Solving: Slow processing;Requires verbal cues;Requires tactile cues        Exercises General Exercises - Lower Extremity Short Arc Quad: AROM;Both;5 reps Long Arc Quad: AROM;Both;5 reps Heel Slides: AROM;Both;5 reps Hip ABduction/ADduction: PROM;Both;5 reps Straight Leg Raises: PROM;Both;5 reps Other Exercises Other Exercises: PT encourages LE PROM including heel slides, SLR, ankle PF stretch    General Comments General comments (skin integrity, edema, etc.): pt HR in low 100s at rest, elevated into low 120s with sitting.  Pt initially reports lightheadedness but this quickly resolves      Pertinent Vitals/Pain Pain Assessment: Faces Faces Pain Scale: Hurts little more Pain Location: intermittent grimacing with LE mobility Pain Descriptors / Indicators: Grimacing Pain Intervention(s): Monitored during session     Home Living                      Prior Function            PT Goals (current goals can now be found in the care plan section) Acute Rehab PT Goals Patient Stated Goal: to walk again PT Goal Formulation: With patient/family Time For Goal Achievement: 12/01/20 Potential to Achieve Goals: Poor Progress towards PT goals:  (goal reinstated, PT had signed off, now re-evaluating at MD request)    Frequency    Min 2X/week      PT Plan Discharge plan needs to be updated;Frequency needs to be updated    Co-evaluation              AM-PAC PT "6 Clicks" Mobility   Outcome Measure  Help needed turning from your back to your side while in a flat bed without using bedrails?: Total Help needed moving from lying on your back to sitting on the side of a flat bed without using bedrails?: Total Help needed moving to and from a bed to a chair (including a wheelchair)?: Total Help needed standing up from a chair using your arms (e.g., wheelchair or bedside chair)?: Total Help needed to walk in hospital room?: Total Help needed climbing 3-5 steps with a railing? : Total 6 Click Score: 6    End of Session   Activity Tolerance: Patient tolerated treatment well Patient left: in bed;with call bell/phone within reach;with bed alarm set;with family/visitor present Nurse Communication: Mobility status;Need for lift equipment PT Visit Diagnosis: Muscle weakness (generalized) (M62.81);Difficulty in walking, not elsewhere classified (R26.2);Pain;Adult, failure to thrive (R62.7);Unsteadiness on feet (R26.81) Pain - part of body:  (generalize in LEs with mobility)     Time: 1251-1330 PT Time Calculation (min) (ACUTE ONLY): 39 min  Charges:  $Therapeutic Activity: 8-22 mins                     Zenaida Niece, PT, DPT Acute Rehabilitation Pager: [phone]    Zenaida Niece 11/17/2020, 2:22 PM

## 2020-11-17 NOTE — Evaluation (Signed)
[facility] - Evaluation Patient Details  Name: Candace [name] MRN: [id number] Date of Birth: 30-Sep-1951  Today's Date: 11/17/2020 Time: 1000-1100 SLP Time Calculation (min) (ACUTE ONLY): 60 min  Past Medical History:  Past Medical History:  Diagnosis Date  . Colon cancer ([place])   . Diabetes mellitus ([place])   . Hyperlipemia   . Hypertension    Past Surgical History:  Past Surgical History:  Procedure Laterality Date  . IR FLUORO GUIDE CV LINE RIGHT  10/19/2020  . IR US GUIDE VASC ACCESS RIGHT  10/19/2020  . LAPAROTOMY N/A 09/01/2020   Procedure: EXPLORATORY LAPAROTOMY; Repair of Perforated Duodenal Ulcer;  Surgeon: Jesusita Oka, MD;  Location: [place];  Service: General;  Laterality: N/A;  . LYSIS OF ADHESION N/A 09/01/2020   Procedure: LYSIS OF ADHESION;  Surgeon: Jesusita Oka, MD;  Location: [place];  Service: General;  Laterality: N/A;   HPI:  70 year old female who was previously diagnosed with Covid 08/23/2020.  Admitted 11/2 and intubated with AF-RVR, found to have a perforated duodenal underwent exploratory laparotomy with Phillip Heal patch placement 11/3, Intubated for 20 days until 11/23 then trach placed. Prolonged ventilation, until 11/17. Profoundly deconditioned.   Assessment / Plan / Recommendation Clinical Impression  Pt demonstrates adequate tolerance of PMSV with evidence of patent upper airway and initial attempts to phonate were made. Pt did have a prolonged period of clearing secretions after cuff deflation. After calming PMSV was placed and pt gradually initiating some spontaneous phrases to communicate wants and needs with breathy low volume phonation. Pt not very responsive to direct cueing to increase volume, take deeper breaths. Her most audible phrases were when telling the RN she didnt want her medications. SLP instructed daughter in safety with valve and placement and removal; pt may wear valve with daughters supervision, all waking hours. Also  introduced interventions to target pts cognition; she was disoriented to date and situation. Discussed ICU delirium and strategies to improve pts awareness of situation. Will continue interventions, recommend HH SLP at f/u given daughter's desire to care for her mother at home. SLP Visit Diagnosis: Aphonia (R49.1)    SLP Assessment       Follow Up Recommendations  24 hour supervision/assistance;Home health SLP    Frequency and Duration min 2x/week  2 weeks    PMSV Trial PMSV was placed for: 40 minutes Able to redirect subglottic air through upper airway: Yes Able to Attain Phonation: Yes Voice Quality: Low vocal intensity;Breathy Able to Expectorate Secretions: Yes Level of Secretion Expectoration with PMSV: Oral Breath Support for Phonation: Severely decreased Intelligibility: Intelligibility reduced Word: 50-74% accurate Phrase: 50-74% accurate Sentence: 50-74% accurate Conversation: 50-74% accurate SpO2 During Trial:  (sensory would not read) Pulse During Trial: 130 Behavior: Alert;Poor eye contact   Tracheostomy Tube  Additional Tracheostomy Tube Assessment Trach Collar Period: all waking hours Secretion Description: thin clear    Vent Dependency  Vent Dependent: No    Cuff Deflation Trial  GO Tolerated Cuff Deflation: Yes Length of Time for Cuff Deflation Trial: 45 minutes Behavior: Alert;Poor eye contact        Surena Welge, Katherene Ponto 11/17/2020, 1:12 PM

## 2020-11-17 NOTE — Plan of Care (Signed)
  Problem: Education: Goal: Utilization of techniques to improve thought processes will improve Outcome: Progressing Goal: Knowledge of the prescribed therapeutic regimen will improve Outcome: Progressing   

## 2020-11-17 NOTE — Progress Notes (Signed)
[facility] Progress Note   Complicated with hemorrhagic shock from perf gastric ulcer and has been on and off CRRT multiple times. Multiple GOC conversations with multiple providers + ethics involved as well. Recs were time limited trial of CRRT.    Assessment:   1. HD dep't AKI.CRRT stopped 11/03/2020.TDC placed on 10/19/20. Family desired ongoing full scope of care, ethics involved, Now on IHD and tol gentle UF: 1L (could try more), 4K, 3h, 400/600, no heparin 2. SVT 3. Hyperkalemia; stable 4. H/o mixed shock: hemorrhagic and septic shock 5. Anemia; CTM, on ESA 6. Perforated duodenal ulcer s/p exlap and graham patch placement 7. 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 ARDS 2/2 COVID-19 Pneumonia 9. A.fib/ DVT 10. Severe calorie malnutrition 11. H/o stage IV colon cancer 12. Hypercalcemia- immobility, stable 13. Metabolic Acidosis, mild, trend with HD  PLAN: - HD tomorrow cont on THS schedule: 3h, 1-2L UF, 2K, no heparin  Subjective:   HD yesterday rec 1L UF Daughter at bedside, updated Out of ICU   Objective:   BP 114/78 (BP Location: Left Arm)   Pulse (!) 127   Temp 98.3 F (36.8 C) (Axillary)   Resp 20   Ht 5' 8" (1.727 m)   Wt 92.6 kg   SpO2 99%   BMI 31.04 kg/m   Intake/Output Summary (Last 24 hours) at 11/17/2020 1315 Last data filed at 11/17/2020 0340 Gross per 24 hour  Intake --  Output 2088 ml  Net -2088 ml   Weight change: 0 kg  Physical Exam: Gen: lying in bed, chronically ill appearing HEENT: trach in place CVS: tachy Resp: unlabored, trach Abd: soft, nontender Ext: bl ue edema Neuro: follows commands, awake, slow to respond ACCESS: R IJ TDC  Imaging: No results found.  Labs: BMET Recent Labs  Lab 11/11/20 0351 11/11/20 1743 11/12/20 0124 11/12/20 0352 11/12/20 1741 11/13/20 0353 11/13/20 1709 11/14/20 0456 11/15/20 0543 11/16/20 0456 11/17/20 0601  NA 135   < > 136 134* 136 136 138 139 141 142 138  K 3.8   < >  2.9* 3.2* 4.2 3.4* 4.0 3.5 3.3* 3.4* 3.6  CL 101   < > 100 98 100 100 103 105 106 109 104  CO2 20*   < > 24 24 24 23 22 20* 18* 17* 24  GLUCOSE 189*   < > 100* 92 140* 84 134* 82 109* 204* 163*  BUN 112*   < > 31* 39* 59* 71* 94* 106* 139* 160* 80*  CREATININE 1.40*   < > 0.60 0.76 1.12* 1.20* 1.44* 1.62* 1.74* 1.78* 0.95  CALCIUM 8.9   < > 8.4* 8.9 9.7 10.0 9.9 9.6 8.8* 8.3* 8.7*  PHOS 8.8*  --  2.8 4.0  --  7.1*  --   --  9.3*  --  4.1   < > = values in this interval not displayed.   CBC Recent Labs  Lab 11/11/20 0351 11/12/20 0124 11/12/20 0352 11/13/20 0353 11/17/20 0601  WBC 10.8* 13.8* 13.6* 11.2* 8.9  NEUTROABS 7.1  --   --  7.8*  --   HGB 6.7* 8.6* 8.5* 8.5* 8.3*  HCT 21.6* 27.8* 27.5* 27.3* 28.1*  MCV 95.2 89.1 89.6 89.8 90.6  PLT 266 291 294 363 311    Medications:    . apixaban  2.5 mg Per Tube BID  . B-complex with vitamin C  1 tablet Per Tube Daily  . chlorhexidine gluconate (MEDLINE KIT)  15 mL Mouth   Rinse BID  . Chlorhexidine Gluconate Cloth  6 each Topical Daily  . cholecalciferol  2,000 Units Per Tube Daily  . clonazepam  0.5 mg Per Tube TID  . darbepoetin (ARANESP) injection - DIALYSIS  100 mcg Intravenous Q Tue-HD  . dextrose  25 g Intravenous STAT  . feeding supplement (PROSource TF)  90 mL Per Tube TID  . fentaNYL  1 patch Transdermal Q72H  . free water  100 mL Per Tube Q4H  . guaiFENesin  15 mL Per Tube Q12H  . insulin aspart  0-9 Units Subcutaneous Q4H  . insulin aspart  4 Units Subcutaneous Q4H  . insulin glargine  15 Units Subcutaneous BID  . magnesium oxide  400 mg Oral BID  . mouth rinse  15 mL Mouth Rinse QID  . metoprolol tartrate  12.5 mg Oral BID  . midodrine  40 mg Per Tube TID WC  . nutrition supplement (JUVEN)  1 packet Per Tube BID BM  . pantoprazole sodium  40 mg Per Tube BID  . QUEtiapine  100 mg Per Tube QHS  . QUEtiapine  50 mg Per Tube Daily  . sodium chloride flush  10-40 mL Intracatheter Q12H       B ,  MD 11/17/2020, 1:15 PM   

## 2020-11-17 NOTE — Progress Notes (Signed)
PROGRESS NOTE    Candace [name]  IRC:[id number] DOB: 1951/05/21 DOA: 08/31/2020 PCP: Algis Greenhouse, MD    Brief Narrative:  Receiving this patient after 77 days in intensive care unit, most of the events are as per hospital records and previous providers documentation.  Patient does have history of colon cancer status post colon resection with colostomy bag, hyperlipidemia, type 2 diabetes, stage III chronic kidney disease.  She was admitted to the hospital and treated for COVID-19 pneumonia and was discharged back home.  Sent to the ER by her home care nurse due to elevated heart rate. 70 year old female who was recently diagnosed with COVID on 08/23/2020 came back to the ER with not feeling well, she was admitted on 08/31/2020 with A. fib with RVR, hemorrhagic shock and found to have perforated duodenal ulcer for that she underwent exploratory laparotomy on 11/3.  She has extensive ICU stay with significant events as below.  11/2 Admitted with rapid Afib 11/3 OR with findings of perforated duodenal ulcer 11/10 progressive hemorrhagic shock, intubated, transfused, pressors, proned; started on CRRT in PM 11/16 Extubated. Re-intubated overnight due to respiratory distress and hypoxia with decreased mentation 11/18 Bronch, cultures sent 11/19 Hgb down getting blood 11/24 Spiked fever, resume empirical antimicrobial therapy 11/26 Hemorrhagic shock, hgb 5.6, increased pressors,  11/30 Per palliative "[name] understanding that patient is unfortunately very fragiledespite ongoing intensive medical treatment and full mechanical support. [name] that the familywantsto continue with all current interventions despite potential outcomes". 12/08 CRRT discontinued due to clotting 12/09 Family requested transfer to tertiary care [facility] denied transfer  12/10 CRRT restarted. Episodes of tachycardia, tachypnea that seem to improve with pain management 12/11 Back in shock. Pressor  requirements up. CXR worse. ABX resumed 12/12 Still requiring inc pressors. Had hypoglycemic event. Basal insulin dosing adjusted 12/13 Pressor requirements better. Now hyperglycemic. Re-adjusted Glycemic control  12/19 Afebrile . Remains on dilaudid and heparin gtt, dilaudid gtt increased overnight for concern of pain / ongoing tachycardia, no other events . NE and precedex off 12/17. Ongoing CRRT- even UF, dosing lokelmia this morning 12/20 On CRRT.  Renal plans for HD cath and moving to HD. Getting wound care 12/24 - renal stopping CRRT today and plans HD 10/24/20 . 40% fio2 on vent via Trach. TAchypenic and tachycardic. Afebrile but wbc up to 27.6K. On TF. On heparin gtt 12/25 - Back on CRRT. On vent via trach at 40% fio2, On scheduled dilaudid as add on to oxy. Per family request 12/24 - they felt scheduled oxy was not adequate and patient was showing signs of opioid withdrawal.  Patient also had  worsening SIRS/sepsis syndrome. Had fever, rising wbc, worsening RR 40-60 and HR 140s sinus-> started On abx yesterday. Fever some better today. WBC plateau at 28,.5K. On new levophed -> signifanct improvement in HR 77 and RR t0 20. On heparin gtt.  On precedex gtt. On levophed gtt 24mg wthi midodrine. On TF 12/30 Remains on CRRT with intermittent pressor requirements. Ethics consult placed 12/29. Ethics rec time trial of CRRT 12/31 failed SBT with RR 40s. Several conversations between care tam and family, who are upset RE plan of care 1/1 back on pressors  1/4: On pressors, keeping even on CVVHD, HGB drop to 6.9, transfused 1 unit. Improving mental status 1/10 remains on low dose levophed 294m 1/11 off levo, attempting HD with UF for first time. Now tolerating intermittent HD. 1/19 patient transferred to progressive bed with tracheostomy on 35% FiO2.  She still has  NG tube feeding.  No PEG tube.  Received dialysis on 1/18.  Assessment & Plan:   Principal Problem:   Atrial fibrillation with RVR  (HCC) Active Problems:   Acute respiratory failure due to COVID-19 (HCC)   New onset a-fib (HCC)   Leukocytosis   Atrial fibrillation with rapid ventricular response (HCC)   Hypoxia   Pressure injury of skin   Acute hypoxemic respiratory failure (HCC)   ARDS (adult respiratory distress syndrome) (HCC)   Perforated duodenal ulcer ([name])   On mechanically assisted ventilation ([name])   Palliative care by specialist   Goals of care, counseling/discussion   Shock ([name])   Acute and chronic respiratory failure (acute-on-chronic) ([name])   Status post tracheostomy ([name])   ESRD (end stage renal disease) ([name])  A. fib with RVR: Patient is not on any rate control medication.  She is on as needed metoprolol.  She is therapeutic on Eliquis.  Heart rate mostly more than 100 and sinus rhythm.  Has been using intermittent IV metoprolol.  Had episode of bradycardia with hypoxia but that has resolved now. Start low-dose metoprolol today with the hope of titrating for optimal rate control so that we do not need to use IV metoprolol.  Continue Eliquis.  Acute respiratory failure due to COVID-19 pneumonia: Status post tracheostomy.  Multiple bacterial pneumonias.  VDRF: Completed all antibiotic therapy. Currently on trach collar with 35% FiO2.  Excessive secretions. Respiratory therapy and PCCM following.  Aggressive suctioning and chest physiotherapy as much possible.  Wean as possible. To be seen by speech for trial of Passy-Muir valve and swallowing.  Acute metabolic encephalopathy: Improved as per patient's daughter at the bedside.  Patient is unable to talk but she follows commands.  Extremely debilitated.  Generalized weakness but no focal neurological deficit.  Persistent hemorrhagic shock: Resolved now.  Blood pressures are adequate.  AKI on chronic kidney disease: Currently on intermittent hemodialysis.  Remains on midodrine for blood pressure support.  Received dialysis 1/18.  As per  nephrology.  A. Fib/DVT of lower extremity on anticoagulation: Continue Eliquis.  Tolerating.  Anemia of critical illness: Multiple transfusions.  Total 12 units of PRBC.  Last PRBC given on 11/11/2020 and has remained stable.  We will continue to closely monitor.  Critical illness myopathy/severe physical deconditioning/inadequate oral intake: Currently has Dobbhoff tube.  She has feeding tube for more than 2 months now, if unable to swallow next few days, will need a PEG tube and will talk to surgery. Extremely debilitated, once stable patient's daughter wants to take her home.  Will need to establish oral intake. Continue to work with PT OT while in the hospital.  Type 2 diabetes with hyperglycemia: Blood sugars are now adequate on current doses of insulin. Hypoglycemic episode on 1/18 evening. Decrease dose of Lantus from 20 units twice daily to 15 units twice daily. Continue 4 units every 4 hours when tube feeding is running. Decrease sliding scale insulin doses to sensitive.  Stage IV sacral decubitus ulcer: Not present on admission.  Patient has extensive sacral decubitus ulcer.  Followed by wound care.  Requested surgical consultation.  History of stage IV colon cancer: She has a colostomy.  Outpatient follow-up.  Pain management: Fentanyl patch, Dilaudid as needed, Seroquel 50 in the morning and 100 in the evening.  Currently optimal pain management.   DVT prophylaxis: apixaban (ELIQUIS) tablet 2.5 mg Start: 11/10/20 1000 Place and maintain sequential compression device Start: 09/07/20 1349 Place TED hose Start: 08/31/20 1711 apixaban (  ELIQUIS) tablet 2.5 mg   Code Status: Full code Family Communication: Patient's daughter at the bedside. Disposition Plan: Status is: Inpatient  Remains inpatient appropriate because:Unsafe d/c plan and Inpatient level of care appropriate due to severity of illness   Dispo: The patient is from: Home              Anticipated d/c is to:  Home              Anticipated d/c date is: > 3 days              Patient currently is not medically stable to d/c.         Consultants:   Cardiology  Surgery  Nephrology  Ethics  PCCM  Procedures:  R PICC 11/5 >> A line 11/9 >> out ETT 11/9 > 11/16, 11/16 >> 09/21/2020, 09/21/2020 tracheostomy>> Lt [place] CVL 11/9 >> R IJ trialysis >> out HD catheter 12/1 >>12/20 12/21 - 14.5 Fr, 23 cm right IJ tunneled hemodialysis catheter placement. Removal of indwelling subclavian catheter.   Antimicrobials:  11/10 MRSA PCR > neg 11/10 BC x 2 > neg 09/22/2020 blood cultures x2>> S epi.  09/22/2020 sputum culture>> MRSA Blood 12/1 >> negative. S.epi 1 out of 2, likely contaminant BCx 2 12/12 >> neg xxx Trach aspirate 12/24 - FEW STAPHYLOCOCCUS AUREUS  Blood 12/24 > Negative  Blood 1/2: neg Sputum 1/4: MSSA  Antibiotics  azithro 11/2 >11/3 Ceftriaxone 11/2  Fluconazole 11/3 Zosyn 11/3 >> 11/7 Vanc 11/10 off Cefepime 11/10 > 11/16 09/22/2020 vancomycin for MRSA PNA >> 12/2 xxxx Vanc 12/11> 12/17 Zosyn 12/11> 12/17 xxxx vanc 12/24 (staph resp cutlure) >> 12/28 Zosyn 12/24 - 12/27 Cefazolin 12/27 >>1/1 linezolid 1/2 eraxis 1/2  Meropenem 1/2 > 1/8   Subjective: Patient seen and examined.  Overnight events noted.  She had blood sugar less than 50 last night.  Her daughter arrived. Extensive events including hospitalization and ICU stays were discussed with patient's daughter.  Patient herself was quiet and calm on my interview, followed simple commands.  However, when I started examining her she started hyperventilating, excessive secretions and asked to avoid touching her.  She started removing my hands on examination of her belly.  She denies any pain.  Heart rate is sinus tachycardic on telemetry.  Currently on 35% FiO2.  Objective: Vitals:   11/16/20 2351 11/17/20 0335 11/17/20 0353 11/17/20 0950  BP:  105/67  114/78  Pulse: (!) 125 (!) 114 (!) 116 (!) 127   Resp:  18 (!) 22 20  Temp:  98.3 F (36.8 C)    TempSrc:  Axillary    SpO2: 100%   99%  Weight:      Height:        Intake/Output Summary (Last 24 hours) at 11/17/2020 1052 Last data filed at 11/17/2020 0340 Gross per 24 hour  Intake 160 ml  Output 2158 ml  Net -1998 ml   Filed Weights   11/16/20 1500 11/16/20 1802 11/16/20 2120  Weight: 86 kg 85 kg 92.6 kg    Examination:  General exam: Sick and frail looking lady, laying in bed.  Not in any distress at rest, however looks anxious and restless on examination. Patient currently on 35% FiO2 through trach collar. Respiratory system: Conducted upper airway sounds.  Otherwise bilateral clear.  Copious clear secretions through the tracheostomy. Cardiovascular system: S1 & S2 heard, RRR.  Tachycardic.   Gastrointestinal system: Abdomen is nondistended, soft.  Patient resists examination and becomes anxious  on exam.  No localized tenderness.  She has a right lower quadrant colostomy bag with brown liquidy stool.  Midline incision with some gaping but healing. . Central nervous system: Alert and awake.  Anxious and restless on his stimulation.  Difficult to assess orientation because of aphasia secondary to tracheostomy. Extremities: Extremely debilitated.  2-3/5 all extremities. Skin: Stage IV sacral decubitus ulcer with undetermined base, pictures in the chart.    Data Reviewed: I have personally reviewed following labs and imaging studies  CBC: Recent Labs  Lab 11/11/20 0351 11/12/20 0124 11/12/20 0352 11/13/20 0353 11/17/20 0601  WBC 10.8* 13.8* 13.6* 11.2* 8.9  NEUTROABS 7.1  --   --  7.8*  --   HGB 6.7* 8.6* 8.5* 8.5* 8.3*  HCT 21.6* 27.8* 27.5* 27.3* 28.1*  MCV 95.2 89.1 89.6 89.8 90.6  PLT 266 291 294 363 694   Basic Metabolic Panel: Recent Labs  Lab 11/12/20 0124 11/12/20 0352 11/12/20 1741 11/13/20 0353 11/13/20 1709 11/14/20 0456 11/15/20 0543 11/16/20 0456 11/17/20 0601  NA 136 134*   < > 136 138  139 141 142 138  K 2.9* 3.2*   < > 3.4* 4.0 3.5 3.3* 3.4* 3.6  CL 100 98   < > 100 103 105 106 109 104  CO2 24 24   < > 23 22 20* 18* 17* 24  GLUCOSE 100* 92   < > 84 134* 82 109* 204* 163*  BUN 31* 39*   < > 71* 94* 106* 139* 160* 80*  CREATININE 0.60 0.76   < > 1.20* 1.44* 1.62* 1.74* 1.78* 0.95  CALCIUM 8.4* 8.9   < > 10.0 9.9 9.6 8.8* 8.3* 8.7*  MG 1.8 1.5*  --  1.9  --   --  1.8 1.8 1.5*  PHOS 2.8 4.0  --  7.1*  --   --  9.3*  --  4.1   < > = values in this interval not displayed.   GFR: Estimated Creatinine Clearance: 66.5 mL/min (by C-G formula based on SCr of 0.95 mg/dL). Liver Function Tests: Recent Labs  Lab 11/12/20 0124  AST 26  ALT 19  ALKPHOS 156*  BILITOT 0.5  PROT 8.0  ALBUMIN 2.7*   No results for input(s): LIPASE, AMYLASE in the last 168 hours. No results for input(s): AMMONIA in the last 168 hours. Coagulation Profile: No results for input(s): INR, PROTIME in the last 168 hours. Cardiac Enzymes: No results for input(s): CKTOTAL, CKMB, CKMBINDEX, TROPONINI in the last 168 hours. BNP (last 3 results) No results for input(s): PROBNP in the last 8760 hours. HbA1C: No results for input(s): HGBA1C in the last 72 hours. CBG: Recent Labs  Lab 11/16/20 2316 11/16/20 2343 11/17/20 0232 11/17/20 0338 11/17/20 0803  GLUCAP 44* 151* 132* 142* 132*   Lipid Profile: No results for input(s): CHOL, HDL, LDLCALC, TRIG, CHOLHDL, LDLDIRECT in the last 72 hours. Thyroid Function Tests: No results for input(s): TSH, T4TOTAL, FREET4, T3FREE, THYROIDAB in the last 72 hours. Anemia Panel: No results for input(s): VITAMINB12, FOLATE, FERRITIN, TIBC, IRON, RETICCTPCT in the last 72 hours. Sepsis Labs: No results for input(s): PROCALCITON, LATICACIDVEN in the last 168 hours.  No results found for this or any previous visit (from the past 240 hour(s)).       Radiology Studies: No results found.      Scheduled Meds: . apixaban  2.5 mg Per Tube BID  .  B-complex with vitamin C  1 tablet Per Tube Daily  .  chlorhexidine gluconate (MEDLINE KIT)  15 mL Mouth Rinse BID  . Chlorhexidine Gluconate Cloth  6 each Topical Daily  . cholecalciferol  2,000 Units Per Tube Daily  . clonazepam  0.5 mg Per Tube TID  . darbepoetin (ARANESP) injection - DIALYSIS  100 mcg Intravenous Q Tue-HD  . dextrose  25 g Intravenous STAT  . feeding supplement (PROSource TF)  90 mL Per Tube TID  . fentaNYL  1 patch Transdermal Q72H  . free water  100 mL Per Tube Q4H  . guaiFENesin  15 mL Per Tube Q12H  . insulin aspart  0-9 Units Subcutaneous Q4H  . insulin aspart  4 Units Subcutaneous Q4H  . insulin glargine  15 Units Subcutaneous BID  . magnesium oxide  400 mg Oral BID  . mouth rinse  15 mL Mouth Rinse QID  . metoprolol tartrate  12.5 mg Oral BID  . midodrine  40 mg Per Tube TID WC  . nutrition supplement (JUVEN)  1 packet Per Tube BID BM  . pantoprazole sodium  40 mg Per Tube BID  . QUEtiapine  100 mg Per Tube QHS  . QUEtiapine  50 mg Per Tube Daily  . sodium chloride flush  10-40 mL Intracatheter Q12H   Continuous Infusions: . sodium chloride 5 mL/hr at 11/16/20 0935  . sodium chloride    . sodium chloride    . sodium chloride    . albumin human 25 g (11/16/20 1516)  . feeding supplement (VITAL 1.5 CAL) 1,000 mL (11/16/20 2340)     LOS: 77 days    Time spent: 45 minutes    Barb Merino, MD [facility] Pager [phone]

## 2020-11-17 NOTE — Evaluation (Signed)
Clinical/Bedside Swallow Evaluation Patient Details  Name: Candace [name] MRN: [id number] Date of Birth: [birth year]  Today's Date: 11/17/2020 Time: SLP Start Time (ACUTE ONLY): 1000 SLP Stop Time (ACUTE ONLY): 1100 SLP Time Calculation (min) (ACUTE ONLY): 60 min  Past Medical History:  Past Medical History:  Diagnosis Date  . Colon cancer ([place])   . Diabetes mellitus ([place])   . Hyperlipemia   . Hypertension    Past Surgical History:  Past Surgical History:  Procedure Laterality Date  . IR FLUORO GUIDE CV LINE RIGHT  10/19/2020  . IR US GUIDE VASC ACCESS RIGHT  10/19/2020  . LAPAROTOMY N/A 09/01/2020   Procedure: EXPLORATORY LAPAROTOMY; Repair of Perforated Duodenal Ulcer;  Surgeon: Jesusita Oka, MD;  Location: [place];  Service: General;  Laterality: N/A;  . LYSIS OF ADHESION N/A 09/01/2020   Procedure: LYSIS OF ADHESION;  Surgeon: Jesusita Oka, MD;  Location: [place];  Service: General;  Laterality: N/A;   HPI:  70 year old female who was previously diagnosed with Covid 08/23/2020.  Admitted 11/2 and intubated with AF-RVR, found to have a perforated duodenal underwent exploratory laparotomy with Phillip Heal patch placement 11/3, Intubated for 20 days until 11/23 then trach placed. Prolonged ventilation, until 1/17. Profoundly deconditioned.   Assessment / Plan / Recommendation Clinical Impression  Brief assessment to determine pts ability to participate in MBS. With cueing pt accepted bites of ice and sips of water. Oral manipulation appeared adequate. There was delayed coughing after sips. Recommend pt proceed with MBS tomorrow. Daughter is aware. SLP Visit Diagnosis: Aphonia (R49.1)    Aspiration Risk       Diet Recommendation NPO;Alternative means - temporary   Medication Administration: Via alternative means    Other  Recommendations     Follow up Recommendations 24 hour supervision/assistance;Home health SLP      Frequency and Duration min 2x/week          Prognosis         Swallow Study   General HPI: 70 year old female who was previously diagnosed with Covid 08/23/2020.  Admitted 11/2 and intubated with AF-RVR, found to have a perforated duodenal underwent exploratory laparotomy with Phillip Heal patch placement 11/3, Intubated for 20 days until 11/23 then trach placed. Prolonged ventilation, until 1/17. Profoundly deconditioned. Type of Study: Bedside Swallow Evaluation Diet Prior to this Study: NPO;NG Tube Temperature Spikes Noted: No Respiratory Status: Trach;Trach Collar Trach Size and Type: #6;Cuff;Deflated;With PMSV in place History of Recent Intubation: Yes Length of Intubations (days): 20 days Date extubated: 09/21/20 Behavior/Cognition: Alert;Cooperative;Pleasant mood Oral Cavity Assessment: Within Functional Limits Oral Care Completed by SLP: No Oral Cavity - Dentition: Adequate natural dentition Self-Feeding Abilities: Total assist Patient Positioning: Upright in bed Baseline Vocal Quality: Normal Volitional Cough: Strong Volitional Swallow: Unable to elicit    Oral/Motor/Sensory Function Overall Oral Motor/Sensory Function: Within functional limits   Ice Chips Ice chips: Within functional limits   Thin Liquid Thin Liquid: Impaired Presentation: Straw Oral Phase Impairments: Poor awareness of bolus Pharyngeal  Phase Impairments: Cough - Delayed    Nectar Thick Nectar Thick Liquid: Not tested   Honey Thick Honey Thick Liquid: Not tested   Puree Puree: Not tested   Solid     Solid: Not tested     Herbie Baltimore, MA CCC-SLP  Acute Rehabilitation Services Pager [phone] Office [phone]  [name] 11/17/2020,1:19 PM

## 2020-11-18 ENCOUNTER — Inpatient Hospital Stay (HOSPITAL_COMMUNITY): Payer: Medicare Other

## 2020-11-18 DIAGNOSIS — Z930 Tracheostomy status: Secondary | ICD-10-CM | POA: Diagnosis not present

## 2020-11-18 DIAGNOSIS — U071 COVID-19: Secondary | ICD-10-CM | POA: Diagnosis not present

## 2020-11-18 DIAGNOSIS — J9621 Acute and chronic respiratory failure with hypoxia: Secondary | ICD-10-CM | POA: Diagnosis not present

## 2020-11-18 DIAGNOSIS — I4891 Unspecified atrial fibrillation: Secondary | ICD-10-CM | POA: Diagnosis not present

## 2020-11-18 LAB — GLUCOSE, CAPILLARY
Glucose-Capillary: 143 mg/dL — ABNORMAL HIGH (ref 70–99)
Glucose-Capillary: 130 mg/dL — ABNORMAL HIGH (ref 70–99)
Glucose-Capillary: 159 mg/dL — ABNORMAL HIGH (ref 70–99)
Glucose-Capillary: 116 mg/dL — ABNORMAL HIGH (ref 70–99)
Glucose-Capillary: 88 mg/dL (ref 70–99)

## 2020-11-18 MED ORDER — APIXABAN 2.5 MG PO TABS
2.5000 mg | ORAL_TABLET | Freq: Two times a day (BID) | ORAL | Status: DC
  Administered 2020-11-18 – 2020-11-21 (×6): 2.5 mg via ORAL
  Filled 2020-11-18 (×6): qty 1

## 2020-11-18 MED ORDER — INSULIN GLARGINE 100 UNIT/ML ~~LOC~~ SOLN
5.0000 [IU] | Freq: Two times a day (BID) | SUBCUTANEOUS | Status: DC
  Administered 2020-11-18 – 2020-11-19 (×2): 5 [IU] via SUBCUTANEOUS
  Filled 2020-11-18 (×3): qty 0.05

## 2020-11-18 MED ORDER — INSULIN ASPART 100 UNIT/ML ~~LOC~~ SOLN
0.0000 [IU] | Freq: Three times a day (TID) | SUBCUTANEOUS | Status: DC
  Administered 2020-11-21: 3 [IU] via SUBCUTANEOUS
  Administered 2020-11-21: 2 [IU] via SUBCUTANEOUS
  Administered 2020-11-21 – 2020-11-23 (×2): 1 [IU] via SUBCUTANEOUS
  Administered 2020-11-23: 2 [IU] via SUBCUTANEOUS
  Administered 2020-11-25 – 2020-11-27 (×4): 1 [IU] via SUBCUTANEOUS
  Administered 2020-11-28 – 2020-11-29 (×3): 2 [IU] via SUBCUTANEOUS
  Administered 2020-11-29: 3 [IU] via SUBCUTANEOUS
  Administered 2020-11-29: 2 [IU] via SUBCUTANEOUS
  Administered 2020-11-30: 7 [IU] via SUBCUTANEOUS
  Administered 2020-11-30: 1 [IU] via SUBCUTANEOUS
  Administered 2020-12-01: 7 [IU] via SUBCUTANEOUS

## 2020-11-18 MED ORDER — QUETIAPINE FUMARATE 100 MG PO TABS
100.0000 mg | ORAL_TABLET | Freq: Every day | ORAL | Status: DC
  Administered 2020-11-18 – 2020-11-20 (×3): 100 mg via ORAL
  Filled 2020-11-18 (×3): qty 1

## 2020-11-18 MED ORDER — NEPRO/CARBSTEADY PO LIQD
237.0000 mL | Freq: Three times a day (TID) | ORAL | Status: DC
  Administered 2020-11-18 – 2020-11-21 (×6): 237 mL via ORAL

## 2020-11-18 MED ORDER — PANTOPRAZOLE SODIUM 40 MG PO PACK
40.0000 mg | PACK | Freq: Two times a day (BID) | ORAL | Status: DC
  Administered 2020-11-18 – 2020-11-21 (×6): 40 mg via ORAL
  Filled 2020-11-18 (×6): qty 20

## 2020-11-18 MED ORDER — VITAMIN D 25 MCG (1000 UNIT) PO TABS
2000.0000 [IU] | ORAL_TABLET | Freq: Every day | ORAL | Status: DC
  Administered 2020-11-19 – 2020-11-21 (×3): 2000 [IU] via ORAL
  Filled 2020-11-18 (×3): qty 2

## 2020-11-18 MED ORDER — ACETAMINOPHEN 160 MG/5ML PO SOLN
650.0000 mg | Freq: Four times a day (QID) | ORAL | Status: DC | PRN
  Administered 2020-11-18: 650 mg via ORAL
  Filled 2020-11-18: qty 20.3

## 2020-11-18 MED ORDER — INSULIN ASPART 100 UNIT/ML ~~LOC~~ SOLN
0.0000 [IU] | Freq: Every day | SUBCUTANEOUS | Status: DC
  Administered 2020-11-26: 3 [IU] via SUBCUTANEOUS
  Administered 2020-11-28 – 2020-11-29 (×2): 2 [IU] via SUBCUTANEOUS
  Administered 2020-11-30: 3 [IU] via SUBCUTANEOUS

## 2020-11-18 MED ORDER — RENA-VITE PO TABS
1.0000 | ORAL_TABLET | Freq: Every day | ORAL | Status: DC
  Administered 2020-11-18 – 2020-11-20 (×3): 1 via ORAL
  Filled 2020-11-18 (×3): qty 1

## 2020-11-18 MED ORDER — HYDROMORPHONE HCL 1 MG/ML IJ SOLN
1.0000 mg | Freq: Every day | INTRAMUSCULAR | Status: DC | PRN
  Administered 2020-11-18: 1 mg via INTRAVENOUS
  Filled 2020-11-18: qty 1

## 2020-11-18 MED ORDER — INSULIN ASPART 100 UNIT/ML ~~LOC~~ SOLN
2.0000 [IU] | Freq: Three times a day (TID) | SUBCUTANEOUS | Status: DC
  Administered 2020-11-18: 2 [IU] via SUBCUTANEOUS

## 2020-11-18 MED ORDER — MIDODRINE HCL 5 MG PO TABS
40.0000 mg | ORAL_TABLET | Freq: Three times a day (TID) | ORAL | Status: DC
  Administered 2020-11-19 – 2020-11-21 (×5): 40 mg via ORAL
  Filled 2020-11-18 (×7): qty 8

## 2020-11-18 MED ORDER — RESOURCE THICKENUP CLEAR PO POWD
ORAL | Status: DC | PRN
  Filled 2020-11-18: qty 125

## 2020-11-18 MED ORDER — JUVEN PO PACK: 1.0000 | PACK | Freq: Two times a day (BID) | ORAL | Status: DC

## 2020-11-18 MED ORDER — CLONAZEPAM 0.5 MG PO TBDP
0.5000 mg | ORAL_TABLET | Freq: Three times a day (TID) | ORAL | Status: DC
  Administered 2020-11-18 – 2020-11-21 (×9): 0.5 mg via ORAL
  Filled 2020-11-18 (×9): qty 1

## 2020-11-18 MED ORDER — GUAIFENESIN 100 MG/5ML PO SOLN
15.0000 mL | Freq: Two times a day (BID) | ORAL | Status: DC
  Administered 2020-11-18 – 2020-11-21 (×6): 300 mg via ORAL
  Filled 2020-11-18: qty 45
  Filled 2020-11-18: qty 15
  Filled 2020-11-18 (×2): qty 45
  Filled 2020-11-18 (×2): qty 15

## 2020-11-18 MED ORDER — METOPROLOL TARTRATE 25 MG/10 ML ORAL SUSPENSION
25.0000 mg | Freq: Two times a day (BID) | ORAL | Status: DC
  Administered 2020-11-18: 25 mg via ORAL
  Filled 2020-11-18: qty 10

## 2020-11-18 MED ORDER — QUETIAPINE FUMARATE 50 MG PO TABS
50.0000 mg | ORAL_TABLET | Freq: Every day | ORAL | Status: DC
Start: 2020-11-19 — End: 2020-11-21
  Administered 2020-11-19 – 2020-11-21 (×3): 50 mg via ORAL
  Filled 2020-11-18 (×3): qty 1

## 2020-11-18 MED ORDER — B COMPLEX-C PO TABS: 1.0000 | ORAL_TABLET | Freq: Every day | ORAL | Status: DC

## 2020-11-18 MED ORDER — DOCUSATE SODIUM 50 MG/5ML PO LIQD: 100.0000 mg | Freq: Two times a day (BID) | ORAL | Status: DC | PRN

## 2020-11-18 NOTE — Progress Notes (Addendum)
Physical Therapy Wound Reevaluation Patient Details  Name: Candace [name] MRN: [id number] Date of Birth: [birth year]  Today's Date: 11/18/2020 Time: 1400-1503 Time Calculation (min): 63 min  Subjective  Subjective: Pt responsive and agreeable to wound care.  Daughter present at beginning of session. Patient and Family Stated Goals: heal wound Date of Onset:  (unknown) Prior Treatments: prior hydro and Dakines  Pain Score: Pain Score: 3   Wound Assessment     Pressure Injury 09/25/20 Sacrum Bilateral;Medial Deep Tissue Pressure Injury - Purple or maroon localized area of discolored intact skin or blood-filled blister due to damage of underlying soft tissue from pressure and/or shear. Purple, non-blanchable, b (Active)  Wound Image   11/18/20 1500  Dressing Type Abdominal binder;Barrier Film (skin prep);Normal saline moist dressing; medipore tape 11/18/20 1500  Dressing Clean;Dry;Intact 11/18/20 1500  Dressing Change Frequency Daily 11/18/20 1500  State of Healing Eschar 11/18/20 1500  Site / Wound Assessment Brown;Yellow;Pink 11/18/20 1500  % Wound base Red or Granulating 10% 11/18/20 1500  % Wound base Yellow/Fibrinous Exudate 80% 11/18/20 1500  % Wound base Black/Eschar 10% 11/18/20 1500  % Wound base Other/Granulation Tissue (Comment) 0% 11/18/20 1500  Peri-wound Assessment Intact 11/18/20 1500  Wound Length (cm) 14.2 cm 11/18/20 1500  Wound Width (cm) 12 cm 11/18/20 1500  Wound Depth (cm) 5.4 cm 11/18/20 1500  Wound Surface Area (cm^2) 170.4 cm^2 11/18/20 1500  Wound Volume (cm^3) 920.16 cm^3 11/18/20 1500  Tunneling (cm) 0 11/18/20 1500  Undermining (cm) 0 11/18/20 1500  Margins Unattached edges (unapproximated) 11/18/20 1500  Drainage Amount Minimal 11/18/20 1500  Drainage Description Purulent 11/18/20 1500  Treatment Debridement (Selective);Hydrotherapy (Pulse lavage);Packing (Saline gauze); Santyl applied to wound bed prior to applying dressing.  11/18/20 1500      Hydrotherapy Pulsed lavage therapy - wound location: sacrum Pulsed Lavage with Suction (psi): 12 psi Pulsed Lavage with Suction - Normal Saline Used: 1000 mL Pulsed Lavage Tip: Tip with splash shield Selective Debridement Selective Debridement - Location: sacrum Selective Debridement - Tools Used: Forceps;Scissors Selective Debridement - Tissue Removed: necrotic adipose, eschar, and slough at base   Wound Assessment and Plan  Wound Therapy - Assess/Plan/Recommendations Wound Therapy - Clinical Statement: Received reconsult to resume hydrotherapy.  Pt continues to have unstagable pressure ulcer on injury with 90% eschar/slough.  Surgery did not feel pt good candidate for surgical debridement due to unstable medical condition.  Pt with benefit from continued hydrotherapy and for selective debridement of unviable tissue in order to decrease bioburden and promote wound bed healing. Wound Therapy - Functional Problem List: Global weakness and immobility Factors Delaying/Impairing Wound Healing: Diabetes Mellitus;Immobility;Multiple medical problems;Other (comment) (poor nutrition) Hydrotherapy Plan: Debridement;Pulsatile lavage with suction;Patient/family education;Dressing change Wound Therapy - Frequency: 6X / week Wound Therapy - Follow Up Recommendations: Skilled nursing facility Wound Plan: see above  Wound Therapy Goals- Improve the function of patient's integumentary system by progressing the wound(s) through the phases of wound healing (inflammation - proliferation - remodeling) by: Decrease Necrotic Tissue to: 50% Decrease Necrotic Tissue - Progress: Goal set today Increase Granulation Tissue to: 50% Increase Granulation Tissue - Progress: Goal set today Goals/treatment plan/discharge plan were made with and agreed upon by patient/family: Yes Time For Goal Achievement: 7 days Wound Therapy - Potential for Goals: Fair  Goals will be updated until maximal potential achieved or  discharge criteria met.  Discharge criteria: when goals achieved, discharge from hospital, MD decision/surgical intervention, no progress towards goals, refusal/missing three consecutive treatments without notification or medical reason.  GP    Abran Richard, PT Acute Rehab Services Pager [phone] [facility] [place] 11/18/2020, 3:28 PM

## 2020-11-18 NOTE — Progress Notes (Signed)
Modified Barium Swallow Progress Note  Patient Details  Name: Brunetta Newingham MRN: [id number] Date of Birth: 1951/01/21  Today's Date: 11/18/2020  Modified Barium Swallow completed.  Full report located under Chart Review in the Imaging Section.  Brief recommendations include the following:  Clinical Impression  Pt demonstrates moderate oral dysphagia with lingual pumping and prolonged oral hold. Verbal encouragement needed at times to elicit swallow. Duaghter reports pt did some oral holding prior to admit. Swallow response also delayed with all boluses collecting in pyriforms prior to the swallow. Pooling of secretions also observed. THere was one instance of sensed trace aspiration with thin before/during the swallow. This improved with small cup sips. Pts pharyngeal strength is WNL. Recommend initiating a conservative diet of dys 2/nectar and advancing as pt becomes more accustomed to oral intake. Warned daughter that appetite and interest in PO may be limited initially. Will f/u for tolerance and advancement.   Swallow Evaluation Recommendations       SLP Diet Recommendations: Dysphagia 2 (Fine chop) solids;Nectar thick liquid   Liquid Administration via: Cup;Straw   Medication Administration: Crushed with puree   Supervision: Full assist for feeding;Full supervision/cueing for compensatory strategies   Compensations: Slow rate;Small sips/bites   Postural Changes: Seated upright at 90 degrees;Remain semi-upright after after feeds/meals (Comment)   Oral Care Recommendations: Oral care BID   Other Recommendations: Place PMSV during PO intake;Have oral suction available;Order thickener from pharmacy    Neyah Ellerman, Katherene Ponto 11/18/2020,11:05 AM

## 2020-11-18 NOTE — Progress Notes (Signed)
Nutrition Follow-up  DOCUMENTATION CODES:   Not applicable  INTERVENTION:   If patient unable to meet >75% of nutrition needs consistently recommend restarting enteral nutrition and placement of long term feeding tube.    Nepro Shake po TID, each supplement provides 425 kcal and 19 grams protein  Magic cup TID with meals, each supplement provides 290 kcal and 9 grams of protein  Renal MVI daily   NUTRITION DIAGNOSIS:   Increased nutrient needs related to wound healing as evidenced by estimated needs.  Ongoing   GOAL:   Patient will meet greater than or equal to 90% of their needs  Progressing  MONITOR:   PO intake,Supplement acceptance,Weight trends,Labs,I & O's,Diet advancement  REASON FOR ASSESSMENT:   Consult New TPN/TNA  ASSESSMENT:   70 y.o. female was recently hospitalized for COVID-19 pneumonia-and discharged on 2-3 L of home O2-presents with worsening shortness of breath-found to have A. fib with RVR, worsening hypoxia. Pt developed severe upper abdominal pain-subsequent further imaging studies revealed perforated viscus. S/p laparotomy on 11/3 which showed a perforated duodenal ulcer.   Undergoing HD with gentle UF on THS schedule. Diet advanced to DYS 2 with nectar thick liquids after MBS today. If showing improvement, may be able to decannulate trach. Cortrak remains in place but tube feeding on hold. RD to provide supplementation to maximize PO kcal and protein. If intake does not progress recommend restarting EN and placing long term feeding tube given ongoing increased nutrition needs.   Admission weight: 99.7 kg  Current weight: 92.6 kg (up from 86.5 kg on 1/11)  UOP: 250 ml x 24 hrs Colostomy: 600 ml x 24 hrs   Medications: vitamin D, aranesp, SS novolog, lantus, mag-ox Labs: Mg 1.5 (L) CBG 44-167  Diet Order:   Diet Order            DIET DYS 2 Room service appropriate? No; Fluid consistency: Nectar Thick  Diet effective now                  EDUCATION NEEDS:   Not appropriate for education at this time  Skin:  Skin Assessment: Skin Integrity Issues: Skin Integrity Issues:: Stage II DTI: sacrum Stage I: n/a Stage II: ear Incisions: abdomen  Last BM:  1/19  Height:   Ht Readings from Last 1 Encounters:  09/20/20 5\' 8"  (1.727 m)    Weight:   Wt Readings from Last 1 Encounters:  11/16/20 92.6 kg    BMI:  Body mass index is 31.04 kg/m.  Estimated Nutritional Needs:   Kcal:  7253-6644 kcals  Protein:  135-175 g  Fluid:  >/= 2 L/day  Mariana Single RD, LDN Clinical Nutrition Pager listed in [place]

## 2020-11-18 NOTE — Progress Notes (Signed)
[facility] Progress Note     Assessment:   1. HD dep't AKI.CRRT stopped 11/03/2020.Trooper placed on 10/19/20. Family desired ongoing full scope of care, Now on IHD and tol gentle UFof up to 1L, will try fo up to 2L now 2. SVT 3. Hyperkalemia; stable 4. H/o mixed shock: hemorrhagic and septic shock; stable 5. Anemia; CTM, on ESA 6. Perforated duodenal ulcer s/p exlap and graham patch placement 7. 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 ARDS 2/2 COVID-19 Pneumonia 9. A.fib/ DVT 10. Severe calorie malnutrition 11. H/o stage IV colon cancer 12. Hypercalcemia- immobility, stable 13. Metabolic Acidosis, mild, trend with HD  PLAN: - HD today cont on THS schedule: 3h, 1-2L UF, 2K, no heparin  Subjective:    Daughter at bedside, updated    Objective:   BP 132/68   Pulse (!) 127   Temp 98.7 F (37.1 C) (Axillary)   Resp (!) 24   Ht 5' 8"  (1.727 m)   Wt 92.6 kg   SpO2 99%   BMI 31.04 kg/m   Intake/Output Summary (Last 24 hours) at 11/18/2020 1217 Last data filed at 11/18/2020 2585 Gross per 24 hour  Intake 200 ml  Output 650 ml  Net -450 ml   Weight change:   Physical Exam: Gen: lying in bed, chronically ill appearing HEENT: trach in place CVS: tachy Resp: unlabored, trach Abd: soft, nontender Ext: bl ue edema Neuro: follows commands, awake, slow to respond ACCESSRetta Diones [facility]  Imaging: DG Swallowing Func-Speech Pathology  Result Date: 11/18/2020 Objective Swallowing Evaluation: Type of Study: MBS-Modified Barium Swallow Study  Patient Details Name: Gema Ringold MRN: [id number] Date of Birth: December 17, 1950 Today's Date: 11/18/2020 Time: SLP Start Time (ACUTE ONLY): 1000 -SLP Stop Time (ACUTE ONLY): 1037 SLP Time Calculation (min) (ACUTE ONLY): 37 min Past Medical History: Past Medical History: Diagnosis Date . Colon cancer ([place])  . Diabetes mellitus ([place])  . Hyperlipemia  . Hypertension  Past Surgical History: Past Surgical History: Procedure Laterality Date . IR  FLUORO GUIDE CV LINE RIGHT  10/19/2020 . IR US GUIDE VASC ACCESS RIGHT  10/19/2020 . LAPAROTOMY N/A 09/01/2020  Procedure: EXPLORATORY LAPAROTOMY; Repair of Perforated Duodenal Ulcer;  Surgeon: Jesusita Oka, MD;  Location: [place];  Service: General;  Laterality: N/A; . LYSIS OF ADHESION N/A 09/01/2020  Procedure: LYSIS OF ADHESION;  Surgeon: Jesusita Oka, MD;  Location: [place];  Service: General;  Laterality: N/A; HPI: 70 year old female who was previously diagnosed with Covid 08/23/2020.  Admitted 11/2 and intubated with AF-RVR, found to have a perforated duodenal underwent exploratory laparotomy with Phillip Heal patch placement 11/3, Intubated for 20 days until 11/23 then trach placed. Prolonged ventilation, until 1/17. Profoundly deconditioned.  No data recorded Assessment / Plan / Recommendation CHL IP CLINICAL IMPRESSIONS 11/18/2020 Clinical Impression Pt demonstrates moderate oral dysphagia with lingual pumping and prolonged oral hold. Verbal encouragement needed at times to elicit swallow. Duaghter reports pt did some oral holding prior to admit. Swallow response also delayed with all boluses collecting in pyriforms prior to the swallow. Pooling of secretions also observed. THere was one instance of sensed trace aspiration with thin before/during the swallow. This improved with small cup sips. Pts pharyngeal strength is WNL. Recommend initiating a conservative diet of dys 2/nectar and advancing as pt becomes more accustomed to oral intake. Warned daughter that appetite and interest in PO may be limited initially. Will f/u for tolerance and advancement. SLP Visit Diagnosis Dysphagia, unspecified (R13.10) Attention and concentration  deficit following -- Frontal lobe and executive function deficit following -- Impact on safety and function Risk for inadequate nutrition/hydration;Mild aspiration risk   CHL IP TREATMENT RECOMMENDATION 11/18/2020 Treatment Recommendations Therapy as outlined in treatment plan below    Prognosis 11/18/2020 Prognosis for Safe Diet Advancement Good Barriers to Reach Goals -- Barriers/Prognosis Comment -- CHL IP DIET RECOMMENDATION 11/18/2020 SLP Diet Recommendations Dysphagia 2 (Fine chop) solids;Nectar thick liquid Liquid Administration via Cup;Straw Medication Administration Crushed with puree Compensations Slow rate;Small sips/bites Postural Changes Seated upright at 90 degrees;Remain semi-upright after after feeds/meals (Comment)   CHL IP OTHER RECOMMENDATIONS 11/18/2020 Recommended Consults -- Oral Care Recommendations Oral care BID Other Recommendations Place PMSV during PO intake;Have oral suction available;Order thickener from pharmacy   CHL IP FOLLOW UP RECOMMENDATIONS 11/18/2020 Follow up Recommendations 24 hour supervision/assistance;Home health SLP   CHL IP FREQUENCY AND DURATION 11/18/2020 Speech Therapy Frequency (ACUTE ONLY) min 2x/week Treatment Duration 2 weeks      CHL IP ORAL PHASE 11/18/2020 Oral Phase Impaired Oral - Pudding Teaspoon -- Oral - Pudding Cup -- Oral - Honey Teaspoon -- Oral - Honey Cup -- Oral - Nectar Teaspoon Delayed oral transit;Lingual pumping;Reduced posterior propulsion;Holding of bolus Oral - Nectar Cup Delayed oral transit;Lingual pumping;Reduced posterior propulsion;Holding of bolus Oral - Nectar Straw Delayed oral transit;Lingual pumping;Reduced posterior propulsion;Holding of bolus Oral - Thin Teaspoon -- Oral - Thin Cup Delayed oral transit;Lingual pumping;Reduced posterior propulsion;Holding of bolus Oral - Thin Straw Delayed oral transit;Lingual pumping;Reduced posterior propulsion;Holding of bolus Oral - Puree Delayed oral transit;Lingual pumping;Reduced posterior propulsion Oral - Mech Soft Delayed oral transit;Lingual pumping;Reduced posterior propulsion;Holding of bolus Oral - Regular -- Oral - Multi-Consistency -- Oral - Pill -- Oral Phase - Comment --  CHL IP PHARYNGEAL PHASE 11/18/2020 Pharyngeal Phase Impaired Pharyngeal- Pudding Teaspoon --  Pharyngeal -- Pharyngeal- Pudding Cup -- Pharyngeal -- Pharyngeal- Honey Teaspoon -- Pharyngeal -- Pharyngeal- Honey Cup -- Pharyngeal -- Pharyngeal- Nectar Teaspoon Delayed swallow initiation-pyriform sinuses Pharyngeal -- Pharyngeal- Nectar Cup Delayed swallow initiation-pyriform sinuses Pharyngeal -- Pharyngeal- Nectar Straw Delayed swallow initiation-pyriform sinuses Pharyngeal -- Pharyngeal- Thin Teaspoon -- Pharyngeal -- Pharyngeal- Thin Cup Delayed swallow initiation-pyriform sinuses Pharyngeal -- Pharyngeal- Thin Straw Delayed swallow initiation-pyriform sinuses;Penetration/Aspiration before swallow;Trace aspiration Pharyngeal Material enters airway, passes BELOW cords and not ejected out despite cough attempt by patient;Material does not enter airway Pharyngeal- Puree Delayed swallow initiation-pyriform sinuses Pharyngeal -- Pharyngeal- Mechanical Soft -- Pharyngeal -- Pharyngeal- Regular Delayed swallow initiation-pyriform sinuses Pharyngeal -- Pharyngeal- Multi-consistency -- Pharyngeal -- Pharyngeal- Pill -- Pharyngeal -- Pharyngeal Comment --  No flowsheet data found. Herbie Baltimore, MA CCC-SLP Acute Rehabilitation Services Pager [phone] Office [phone] [name] 11/18/2020, 11:06 AM               Labs: BMET Recent Labs  Lab 11/12/20 0124 11/12/20 0352 11/12/20 1741 11/13/20 0353 11/13/20 1709 11/14/20 0456 11/15/20 0543 11/16/20 0456 11/17/20 0601  NA 136 134* 136 136 138 139 141 142 138  K 2.9* 3.2* 4.2 3.4* 4.0 3.5 3.3* 3.4* 3.6  CL 100 98 100 100 103 105 106 109 104  CO2 24 24 24 23 22  20* 18* 17* 24  GLUCOSE 100* 92 140* 84 134* 82 109* 204* 163*  BUN 31* 39* 59* 71* 94* 106* 139* 160* 80*  CREATININE 0.60 0.76 1.12* 1.20* 1.44* 1.62* 1.74* 1.78* 0.95  CALCIUM 8.4* 8.9 9.7 10.0 9.9 9.6 8.8* 8.3* 8.7*  PHOS 2.8 4.0  --  7.1*  --   --  9.3*  --  4.1   CBC Recent Labs  Lab 11/12/20 0124 11/12/20 0352 11/13/20 0353 11/17/20 0601  WBC 13.8* 13.6*  11.2* 8.9  NEUTROABS  --   --  7.8*  --   HGB 8.6* 8.5* 8.5* 8.3*  HCT 27.8* 27.5* 27.3* 28.1*  MCV 89.1 89.6 89.8 90.6  PLT 291 294 363 311    Medications:    . apixaban  2.5 mg Oral BID  . [START ON 11/19/2020] B-complex with vitamin C  1 tablet Oral Daily  . chlorhexidine gluconate (MEDLINE KIT)  15 mL Mouth Rinse BID  . Chlorhexidine Gluconate Cloth  6 each Topical Daily  . [START ON 11/19/2020] cholecalciferol  2,000 Units Oral Daily  . clonazepam  0.5 mg Oral TID  . collagenase   Topical BID  . darbepoetin (ARANESP) injection - DIALYSIS  100 mcg Intravenous Q Tue-HD  . fentaNYL  1 patch Transdermal Q72H  . guaiFENesin  15 mL Oral Q12H  . insulin aspart  0-5 Units Subcutaneous QHS  . insulin aspart  0-9 Units Subcutaneous TID WC  . insulin aspart  2 Units Subcutaneous TID WC  . insulin glargine  5 Units Subcutaneous BID  . magnesium oxide  400 mg Oral BID  . mouth rinse  15 mL Mouth Rinse QID  . metoprolol tartrate  25 mg Oral BID  . midodrine  40 mg Oral TID WC  . nutrition supplement (JUVEN)  1 packet Oral BID BM  . pantoprazole sodium  40 mg Oral BID  . QUEtiapine  100 mg Oral QHS  . [START ON 11/19/2020] QUEtiapine  50 mg Oral Daily  . sodium chloride flush  10-40 mL Intracatheter Q12H      Rexene Agent, MD 11/18/2020, 12:17 PM

## 2020-11-18 NOTE — Progress Notes (Signed)
NAMEBeyounce [name], MRN:  [id number], DOB:  September 07, 1951, LOS: 78 ADMISSION DATE:  08/31/2020, CONSULTATION DATE:  09/07/2020 REFERRING MD:  Dr Candiss Norse, CHIEF COMPLAINT:  Acute resp failure  Brief History   70 year old female who was previously diagnosed with Covid 08/23/2020.  Admitted 11/2 with AF-RVR, found to have a perforated duodenal underwent exploratory laparotomy with Phillip Heal patch placement 11/3.  Past Medical History  Covid pneumonia Atrial fibrillation CKD stage III Diabetes mellitus Hypertension Colon cancer Hyperlipidemia  Significant Hospital Events   11/2 Admitted  11/3 OR with findings of perforated duodenal ulcer 11/10 progressive hemorrhagic shock, intubated, transfused, pressors, proned; started on CRRT in PM 11/03 Exploratory laparotomy, Phillip Heal patch, lysis of adhesion for duodenal ulceration postop day 6 11/16 Extubated. Re-intubated overnight due to respiratory distress and hypoxia with decreased mentation 11/18 Bronch, cultures sent 11/19 Hgb down getting blood 11/24 Spiked fever resume empirical antimicrobial therapy 11/26 Hemorrhagic shock, hgb 5.6, increased pressors, CT A/P  11/30 Per palliative "[name] expresses understanding that patient is unfortunately very fragile despite ongoing intensive medical treatment and full mechanical support. She indicates that the family wants to continue with all current interventions despite potential outcomes". 12/08 CRRT discontinued due to clotting 12/09 Family requested transfer to tertiary care [facility]). [facility] denied transfer  12/10 CRRT restarted. Episodes of tachycardia, tachypnea that seem to improve with pain management 12/11 Back in shock. Pressor requirements up. CXR worse. ABX resumed 12/12 Still requiring inc pressors. Had hypoglycemic event. Basal insulin dosing adjusted 12/13 Pressor requirements better. Now hyperglycemic. Re-adjusted Glycemic control  12/14 Changed dilaudid to1/2 dosing from day further. D/c  vasopressin.  Goals of care reconfirmed with daughter.  Patient continues to desire aggressive care.  Not open to discussing any other option, patient family continues to be hopeful that she will be discharged to home with full recovery in spite of multiple attempts by staff to prepare them that this is unlikely scenario 12/15 Dilaudid discontinued sending cortisol for ongoing pressor dependence 12/16 Serum cortisol <20, added stress dose steroids.  PRN Dilaudid, attempting not go back on Dilaudid infusion 12/17 Developed worsening tachycardia during the evening hours received initially IV albumin, followed by resuming IV Dilaudid with question of suboptimal pain control.  Currently looks better back on Dilaudid drip.  We have been able to wean pressors after adding stress dose steroids; near arrest - bradycardia, better with atropine  12/19 Afebrile . Remains on dilaudid and heparin gtt, dilaudid gtt increased overnight for concern of pain / ongoing tachycardia, no other events . NE and precedex off 12/17. Ongoing CRRT- even UF, dosing lokelmia this morning 12/20 On CRRT.  Renal plans for HD cath and moving to HD. Getting wound care  12/23 -No vent weaning per RT, remains on full support, #8 trach in place but previously had #6, no documentation of change noted in chart Afebrile / WBC 20.4  Vent - 30% FiO2, PEEP 5 Glucose range 176-212 I/O 465 ml stool, 3.6L removed with HD, -1.1L in last 24 hours  RN reports ongoing periods of tachypnea / vent dyssynchrony that responds to dilaudid   12/24 - renal stopping CRRT today and plans HD 10/24/20 . 40% fio2 on vent via Trach. TAchypenic and tachycardic. Afebrile but wbc up to 27.6K. On TF. Onn heparin gtt  12/25 - Back on CRRT. On vent via trach at 40% fio2, On scheduled dilaudid as add on to oxy. Per family request 12/24 - they felt scheduled oxy was not adequate and patient was showing  signs of opioid withdrawal.  Patient also had  worsening SIRS/sepsis  syndrome. Had fever, rising wbc, worsening RR 40-60 and HR 140s sinus-> started On abx yesterday. Fever some better today. WBC plateau at 28,.5K. On new levophed -> signifanct improvement in HR 77 and RR t0 20. On heparin gtt.  On precedex gtt. On levophed gtt 42mcg wthi midodrine. On TF 12/30 Remains on CRRT with intermittent pressor requirements. Ethics consult placed evening of 12/29. Ethics rec time trial of CRRT 12/31 failed SBT with RR 40s. Several conversations between care tam and family, who are upset RE plan of care 1/1 back on pressors  1/4: On pressors, keeping even on CVVHD, HGB drop to 6.9, transfused 1 unit. Improving mental status 1/10 remains on low dose levophed 98mcg 1/11 off levo, attempting HD with UF for first time.   Consults:  Cardiology [facility] Nephrology  Ethics  Procedures:  R PICC 11/5 >> A line 11/9 >> out ETT 11/9 > 11/16, 11/16 >> 09/21/2020, 09/21/2020 tracheostomy>> Lt [place] CVL 11/9 >> R IJ trialysis >> out HD catheter 12/1 >>12/20 12/21 - 14.5 Fr, 23 cm right IJ tunneled hemodialysis catheter placement.  Removal of indwelling subclavian catheter.   Significant Diagnostic Tests:  11/3 CT abd/ pelvis > 1. Positive for bowel perforation: Pneumoperitoneum and intermediate density free fluid in the abdomen. Prior total colectomy. The specific site of perforation is unclear-oral contrast present to the proximal jejunum has not obviously leaked. Note that there may be small bowel loops adherent to the ventral abdominal wall along the greater curve of the stomach. 2. Extensive bilateral lower lung pneumonia. No pleural effusion. 3. Other abdominal and pelvic viscera are stable since 2015, including bilateral adrenal adenomas. Chronic renal parapelvic cysts. 4. Aortic Atherosclerosis 11/3 TTE > EF 70-75%, RV not well visualized, mildly reduced RV systolic function 10/62 CT chest/ abd/ pelvis> 1. Interval progression of diffuse bilateral hazy ground-glass airspace  opacities with more focal areas of consolidation at the lung bases 2. Trace bilateral pleural effusions. 3. Postsurgical changes the abdomen as detailed above. No evidence for a postoperative abscess, however evaluation is limited by lack of IV contrast. 4. There is a 1.9 cm cystic appearing lesion located in the pancreatic body. This was not present on the patient's CT from 2015.  Follow-up with an outpatient contrast enhanced MRI is recommended. 5. The endometrial stripe appears diffusely thickened. Follow-up with pelvic ultrasound is recommended. Aortic Atherosclerosis 11/14 LE doppler studies > + DVT of right posterior tibial and peroneal vein, +dVT of left posterior tibial vein  11/26 CT ABD PEL > liver unremarkable, distended gallbladder with layering tiny gallstones versus sludge, no duct dilatation, mild hyperdensity right upper pole renal collecting system new.  No evidence of retroperitoneal bleeding.  Multifocal lower lobe predominant pulmonary infiltrates/pneumonia.  Small left pleural effusion. 1/16 Venous duplex RUE >> negative Micro Data:  11/10 MRSA PCR > neg 11/10 BC x 2 > neg 09/22/2020 blood cultures x2>> S epi.  09/22/2020 sputum culture>> MRSA Blood 12/1 >> negative. S.epi 1 out of 2, likely contaminant BCx 2 12/12 >> neg xxx Trach aspirate 12/24 - FEW STAPHYLOCOCCUS AUREUS  Blood 12/24 > Negative  Blood 1/2: neg Sputum 1/4: MSSA  Antimicrobials:  azithro 11/2 >11/3 Ceftriaxone 11/2  Fluconazole 11/3 Zosyn 11/3 >> 11/7 Vanc 11/10 off Cefepime 11/10 > 11/16 09/22/2020 vancomycin for MRSA PNA >> 12/2 xxxx Vanc 12/11> 12/17 Zosyn 12/11> 12/17 xxxx vanc 12/24 (staph resp cutlure) >> 12/28 Zosyn 12/24 - 12/27 Cefazolin  12/27 >>1/1 linezolid 1/2 eraxis 1/2  Meropenem 1/2 > 1/8  Subjective:   Continues to improve  Objective   Blood pressure 132/68, pulse (!) 127, temperature 98.7 F (37.1 C), temperature source Axillary, resp. rate (!) 24, height 5\' 8"   (1.727 m), weight 92.6 kg, SpO2 99 %.    FiO2 (%):  [28 %] 28 %   Intake/Output Summary (Last 24 hours) at 11/18/2020 1100 Last data filed at 11/18/2020 9935 Gross per 24 hour  Intake 200 ml  Output 650 ml  Net -450 ml   Filed Weights   11/16/20 1500 11/16/20 1802 11/16/20 2120  Weight: 86 kg 85 kg 92.6 kg   Physical Exam  Awake but frail follows commands looks more alert today Tracheostomy is in place Passy-Muir valve is in place she is able to phonate she is able to eat small amounts Chest with shallow respirations but adequate saturations on trach collar Cardiac remains sinus tachycardia with adequate blood pressure Abdomen with colonoscopy with material.  All areas with dressing In your Extremities are warm and dry   .   Resolved problems:  Previously hemorrhagic shock requiring PRBCs, Septic shock, MRSA pneumonia s/p 8 days abx Complicated by MRSA healthcare associated pneumonia (treated) Peritonitis and perforated ulcer post repair 09/01/20 MSSA HAP Vent dependency   Assessment:  Honore Wipperfurth is a 70 y.o. with history of COVID 19 infection requiring hospitalization in October 2021 with current admission for hemorrhagic shock secondary to perforated gastric ulcer requiring surgical intervention and [place]. 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  Persistent SVT, sinus tachycardia Per primary  Renal failure Lab Results  Component Value Date   CREATININE 0.95 11/17/2020   CREATININE 1.78 (H) 11/16/2020   CREATININE 1.74 (H) 11/15/2020     Per nephrology  Afib/DVT lower extremity Asymmetric right upper extremity edema/swelling negative ultrasound Per primary   Anemia Recent Labs    11/17/20 0601  HGB 8.3*   No obvious bleeding, suspect nosocomial losses with chronic disease Per primary  Critical Illness Myopathy Severe deconditioning  Inadequate PO intake   Per primary   DM2 with hyperglycemia CBG (last 3)  Recent Labs    11/17/20 2309 11/18/20 0343 11/18/20 0825  GLUCAP 202* 143* 130*     Scale insulin protocol Per primary    Stage IV sacral ulcer Per primary  History of stage IV colon cancer 1.9 cm cystic-appearing lesion located in pancreatic body no intervention at this time.  Outpatient evaluation as needed  multiple [place] conversations with various PCCM providers and family have not yielded changes in scope of tx, multiple PCCM providers have been fired by family.  -concerns for futility of certain interventions -ethics consulted 12/30-- recs for time limited trial of CRRT, please see separate Ethics consult note for full details. Futility policy reviewed with family by ethics. Ongoing meetings with Dr. Hulen Skains with nursing administration and family. The ultimate goal is to transition her safely to HD from CRRT. 11/16/2020 again will be placed on intermittent hemodialysis. 11/16/2018 wheeze criteria for transition to progressive care since she has been off the ventilator for 72 hours. With an order for transfer to progressive care.  11/18/2020 is doing well on progressive care unit.  Best practice:  Diet: EN 11/18/2020 Was progressed to p.o. diet after passing swallow evaluation Pain/Anxiety/Delirium protocol (if indicated): Precedex was discontinued 11/11/2020 continues to have fentanyl patch and Dilaudid scheduled VAP protocol (if indicated): yes DVT prophylaxis: Eliquis GI prophylaxis: Protonix Glucose control: SSI, TF coverage, lantus Mobility: PT Family communication: Daughter  updated daily updated 11/18/2020 Independent APP CCT 39 min  Richardson Landry Traeton Bordas ACNP Acute Care Nurse Practitioner [place] Please consult [place] 11/18/2020, 11:00 AM

## 2020-11-18 NOTE — Progress Notes (Signed)
Woody, RN called and notified the pt HD tx has been moved to 11/19/2020.

## 2020-11-18 NOTE — Progress Notes (Signed)
PROGRESS NOTE    Candace [name]  JIR:[id number] DOB: May 10, 1951 DOA: 08/31/2020 PCP: Algis Greenhouse, MD    Brief Narrative:  Receiving this patient after 77 days in intensive care unit, most of the events are as per hospital records and previous providers documentation.  Patient does have history of colon cancer status post colon resection with colostomy bag, hyperlipidemia, type 2 diabetes, stage III chronic kidney disease.  She was admitted to the hospital and treated for COVID-19 pneumonia and was discharged back home.  Sent to the ER by her home care nurse due to elevated heart rate. 70 year old female who was recently diagnosed with COVID on 08/23/2020 came back to the ER with not feeling well, she was admitted on 08/31/2020 with A. fib with RVR, hemorrhagic shock and found to have perforated duodenal ulcer for that she underwent exploratory laparotomy on 11/3.  She has extensive ICU stay with significant events as below.  11/2 Admitted with rapid Afib 11/3 OR with findings of perforated duodenal ulcer 11/10 progressive hemorrhagic shock, intubated, transfused, pressors, proned; started on CRRT in PM 11/16 Extubated. Re-intubated overnight due to respiratory distress and hypoxia with decreased mentation 11/18 Bronch, cultures sent 11/19 Hgb down getting blood 11/24 Spiked fever, resume empirical antimicrobial therapy 11/26 Hemorrhagic shock, hgb 5.6, increased pressors,  11/30 Per palliative "[name] understanding that patient is unfortunately very fragiledespite ongoing intensive medical treatment and full mechanical support. [name] that the familywantsto continue with all current interventions despite potential outcomes". 12/08 CRRT discontinued due to clotting 12/09 Family requested transfer to tertiary care [facility]). [facility] denied transfer  12/10 CRRT restarted. Episodes of tachycardia, tachypnea that seem to improve with pain management 12/11 Back in shock. Pressor  requirements up. CXR worse. ABX resumed 12/12 Still requiring inc pressors. Had hypoglycemic event. Basal insulin dosing adjusted 12/13 Pressor requirements better. Now hyperglycemic. Re-adjusted Glycemic control  12/19 Afebrile . Remains on dilaudid and heparin gtt, dilaudid gtt increased overnight for concern of pain / ongoing tachycardia, no other events . NE and precedex off 12/17. Ongoing CRRT- even UF, dosing lokelmia this morning 12/20 On CRRT.  Renal plans for HD cath and moving to HD. Getting wound care 12/24 - renal stopping CRRT today and plans HD 10/24/20 . 40% fio2 on vent via Trach. TAchypenic and tachycardic. Afebrile but wbc up to 27.6K. On TF. On heparin gtt 12/25 - Back on CRRT. On vent via trach at 40% fio2, On scheduled dilaudid as add on to oxy. Per family request 12/24 - they felt scheduled oxy was not adequate and patient was showing signs of opioid withdrawal.  Patient also had  worsening SIRS/sepsis syndrome. Had fever, rising wbc, worsening RR 40-60 and HR 140s sinus-> started On abx yesterday. Fever some better today. WBC plateau at 28,.5K. On new levophed -> signifanct improvement in HR 77 and RR t0 20. On heparin gtt.  On precedex gtt. On levophed gtt 4mg wthi midodrine. On TF 12/30 Remains on CRRT with intermittent pressor requirements. Ethics consult placed 12/29. Ethics rec time trial of CRRT 12/31 failed SBT with RR 40s. Several conversations between care tam and family, who are upset RE plan of care 1/1 back on pressors  1/4: On pressors, keeping even on CVVHD, HGB drop to 6.9, transfused 1 unit. Improving mental status 1/10 remains on low dose levophed 255m 1/11 off levo, attempting HD with UF for first time. Now tolerating intermittent HD. 1/19 patient transferred to progressive bed with tracheostomy on 35% FiO2.  has NG tube  feeding.  No PEG tube.  Received dialysis on 1/18.  Assessment & Plan:   Principal Problem:   Atrial fibrillation with RVR  (HCC) Active Problems:   Acute respiratory failure due to COVID-19 (HCC)   New onset a-fib (HCC)   Leukocytosis   Atrial fibrillation with rapid ventricular response (HCC)   Hypoxia   Pressure injury of skin   Acute hypoxemic respiratory failure (HCC)   ARDS (adult respiratory distress syndrome) (HCC)   Perforated duodenal ulcer ([place])   On mechanically assisted ventilation ([place])   Palliative care by specialist   Goals of care, counseling/discussion   Shock ([place])   Acute and chronic respiratory failure (acute-on-chronic) ([place])   Status post tracheostomy ([place])   ESRD (end stage renal disease) ([place])  A. fib with RVR: Treated with as needed metoprolol IV.  Now her heart rate is consistently elevated.   Started on metoprolol, will increase dose today.  Therapeutic on Eliquis.  Sinus rhythm.    Acute respiratory failure due to COVID-19 pneumonia: Status post tracheostomy.  Multiple bacterial pneumonias.  VDRF: Completed all antibiotic therapy. Currently on trach collar with 28% % FiO2.   Respiratory therapy and PCCM following.  Aggressive suctioning and chest physiotherapy as much possible.  Wean as possible. Patient was able to tolerate Passy-Muir valve.  She passed modified barium swallow evaluation today and able to start on dysphagia 2 diet with thin liquids.  Acute metabolic encephalopathy: Improved as per patient's daughter at the bedside.  Patient is able to follow commands and have conversation.   Extremely debilitated.  Generalized weakness but no focal neurological deficit.  hemorrhagic shock: Resolved now.  Blood pressures are adequate.  Hemoglobin is adequate.  AKI due to sepsis: Currently on intermittent hemodialysis.  Remains on midodrine for blood pressure support.  Received dialysis 1/18.  As per nephrology. Family interested on dialysis at home, social worker and nephrology following.  A. Fib/DVT of lower extremity on anticoagulation: Continue Eliquis.   Tolerating.  Anemia of critical illness: Multiple transfusions.  Total 12 units of PRBC.  Last PRBC given on 11/11/2020 and has remained stable.  We will continue to closely monitor.  Critical illness myopathy/severe physical deconditioning/inadequate oral intake: Dobbhoff tube feeding present for more than 2 months, passed swallow evaluation today. Start dysphagia diet, supplemental nutrition.  Will discontinue tube feeding today and ultimately discontinue NG tube if she is able to eat more than 50% of her meal.  Extremely debilitated, once stable patient's daughter wants to take her home.  Continue to work with PT OT while in the hospital.  Type 2 diabetes with hyperglycemia: Blood sugars are now adequate on current doses of insulin. Hypoglycemic episode on 1/18 evening. Patient was on metformin at home before admission. On high-dose insulin while on continuous tube feeding. Going to stop insulin today, decrease doses of long-acting insulin.  Changed to prandial insulin and sliding scale.  Stage IV sacral decubitus ulcer: Not present on admission.  Patient has extensive sacral decubitus ulcer.  Followed by wound care. Seen by surgery.  Recommended against general anesthesia and surgical procedure.  Local wound care instructions present.  History of stage IV colon cancer: She has a colostomy.  Outpatient follow-up.  Pain management: Fentanyl patch, Dilaudid as needed, Seroquel 50 in the morning and 100 in the evening.  Currently optimal pain management. Patient is on Klonopin that we will continue. Has not used Dilaudid for last 1 week, will discontinue.     DVT prophylaxis: apixaban (ELIQUIS) tablet  2.5 mg Start: 11/18/20 2200 Place and maintain sequential compression device Start: 09/07/20 1349 Place TED hose Start: 08/31/20 1711 apixaban (ELIQUIS) tablet 2.5 mg   Code Status: Full code Family Communication: Patient's daughter at the bedside. Disposition Plan: Status is:  Inpatient  Remains inpatient appropriate because:Unsafe d/c plan and Inpatient level of care appropriate due to severity of illness   Dispo: The patient is from: Home              Anticipated d/c is to: Home with home health              Anticipated d/c date is: > 3 days              Patient currently is not medically stable to d/c.  Consultants:   Cardiology  Surgery  Nephrology  Ethics  PCCM  Procedures:  R PICC 11/5 >> A line 11/9 >> out ETT 11/9 > 11/16, 11/16 >> 09/21/2020, 09/21/2020 tracheostomy>> Lt [place] CVL 11/9 >> R IJ trialysis >> out HD catheter 12/1 >>12/20 12/21 - 14.5 Fr, 23 cm right IJ tunneled hemodialysis catheter placement. Removal of indwelling subclavian catheter.   Antimicrobials:  11/10 MRSA PCR > neg 11/10 BC x 2 > neg 09/22/2020 blood cultures x2>> S epi.  09/22/2020 sputum culture>> MRSA Blood 12/1 >> negative. S.epi 1 out of 2, likely contaminant BCx 2 12/12 >> neg xxx Trach aspirate 12/24 - FEW STAPHYLOCOCCUS AUREUS  Blood 12/24 > Negative  Blood 1/2: neg Sputum 1/4: MSSA  Antibiotics  azithro 11/2 >11/3 Ceftriaxone 11/2  Fluconazole 11/3 Zosyn 11/3 >> 11/7 Vanc 11/10 off Cefepime 11/10 > 11/16 09/22/2020 vancomycin for MRSA PNA >> 12/2 xxxx Vanc 12/11> 12/17 Zosyn 12/11> 12/17 xxxx vanc 12/24 (staph resp cutlure) >> 12/28 Zosyn 12/24 - 12/27 Cefazolin 12/27 >>1/1 linezolid 1/2 eraxis 1/2  Meropenem 1/2 > 1/8   Subjective: Patient seen and examined.  No overnight events.  Today she was going for modified barium swallow and responded thank you when I wished her good luck.  Denies any complaints. Daughter at the bedside. Patient came back from swallow evaluation and was able to safely eat dysphagia 2 diet. Telemetry shows sinus tachycardia with heart rate more than 120s.  Patient denies any pain.  Objective: Vitals:   11/18/20 0500 11/18/20 0600 11/18/20 0800 11/18/20 0900  BP: 127/70 128/78 132/68   Pulse: (!) 118 (!)  118 (!) 125 (!) 127  Resp: (!) 28 (!) 31 (!) 27 (!) 24  Temp:      TempSrc:      SpO2: 98% 99% 100% 99%  Weight:      Height:        Intake/Output Summary (Last 24 hours) at 11/18/2020 1137 Last data filed at 11/18/2020 0819 Gross per 24 hour  Intake 200 ml  Output 650 ml  Net -450 ml   Filed Weights   11/16/20 1500 11/16/20 1802 11/16/20 2120  Weight: 86 kg 85 kg 92.6 kg    Examination:  General exam: Sick and frail looking. Not in any distress at rest, however looks anxious at times.Marland Kitchen Respiratory system: Conducted upper airway sounds.  Otherwise bilateral clear.  Today she is on 28% FiO2.  Tracheostomy is clear. Cardiovascular system: S1 & S2 heard, RRR.  Tachycardic.   Gastrointestinal system: Abdomen is nondistended, soft.  Patient resists examination and becomes anxious on exam.  No localized tenderness.  She has a right lower quadrant colostomy bag with brown liquidy stool.  Midline incision with  granulation tissue, no evidence of infection.. Central nervous system: Alert and awake.  Follows commands.  Looks appropriate.   Extremities: Extremely debilitated.  3/5 all extremities. Skin: Stage IV sacral decubitus ulcer with undetermined base, pictures in the chart.    Data Reviewed: I have personally reviewed following labs and imaging studies  CBC: Recent Labs  Lab 11/12/20 0124 11/12/20 0352 11/13/20 0353 11/17/20 0601  WBC 13.8* 13.6* 11.2* 8.9  NEUTROABS  --   --  7.8*  --   HGB 8.6* 8.5* 8.5* 8.3*  HCT 27.8* 27.5* 27.3* 28.1*  MCV 89.1 89.6 89.8 90.6  PLT 291 294 363 144   Basic Metabolic Panel: Recent Labs  Lab 11/12/20 0124 11/12/20 0352 11/12/20 1741 11/13/20 0353 11/13/20 1709 11/14/20 0456 11/15/20 0543 11/16/20 0456 11/17/20 0601  NA 136 134*   < > 136 138 139 141 142 138  K 2.9* 3.2*   < > 3.4* 4.0 3.5 3.3* 3.4* 3.6  CL 100 98   < > 100 103 105 106 109 104  CO2 24 24   < > 23 22 20* 18* 17* 24  GLUCOSE 100* 92   < > 84 134* 82 109*  204* 163*  BUN 31* 39*   < > 71* 94* 106* 139* 160* 80*  CREATININE 0.60 0.76   < > 1.20* 1.44* 1.62* 1.74* 1.78* 0.95  CALCIUM 8.4* 8.9   < > 10.0 9.9 9.6 8.8* 8.3* 8.7*  MG 1.8 1.5*  --  1.9  --   --  1.8 1.8 1.5*  PHOS 2.8 4.0  --  7.1*  --   --  9.3*  --  4.1   < > = values in this interval not displayed.   GFR: Estimated Creatinine Clearance: 66.5 mL/min (by C-G formula based on SCr of 0.95 mg/dL). Liver Function Tests: Recent Labs  Lab 11/12/20 0124  AST 26  ALT 19  ALKPHOS 156*  BILITOT 0.5  PROT 8.0  ALBUMIN 2.7*   No results for input(s): LIPASE, AMYLASE in the last 168 hours. No results for input(s): AMMONIA in the last 168 hours. Coagulation Profile: No results for input(s): INR, PROTIME in the last 168 hours. Cardiac Enzymes: No results for input(s): CKTOTAL, CKMB, CKMBINDEX, TROPONINI in the last 168 hours. BNP (last 3 results) No results for input(s): PROBNP in the last 8760 hours. HbA1C: No results for input(s): HGBA1C in the last 72 hours. CBG: Recent Labs  Lab 11/17/20 1602 11/17/20 1953 11/17/20 2309 11/18/20 0343 11/18/20 0825  GLUCAP 160* 164* 202* 143* 130*   Lipid Profile: No results for input(s): CHOL, HDL, LDLCALC, TRIG, CHOLHDL, LDLDIRECT in the last 72 hours. Thyroid Function Tests: No results for input(s): TSH, T4TOTAL, FREET4, T3FREE, THYROIDAB in the last 72 hours. Anemia Panel: No results for input(s): VITAMINB12, FOLATE, FERRITIN, TIBC, IRON, RETICCTPCT in the last 72 hours. Sepsis Labs: No results for input(s): PROCALCITON, LATICACIDVEN in the last 168 hours.  No results found for this or any previous visit (from the past 240 hour(s)).       Radiology Studies: DG Swallowing Func-Speech Pathology  Result Date: 11/18/2020 Objective Swallowing Evaluation: Type of Study: MBS-Modified Barium Swallow Study  Patient Details Name: Debbi Strandberg MRN: [id number] Date of Birth: [birth year] Today's Date: 11/18/2020 Time: SLP Start Time (ACUTE  ONLY): 1000 -SLP Stop Time (ACUTE ONLY): 1037 SLP Time Calculation (min) (ACUTE ONLY): 37 min Past Medical History: Past Medical History: Diagnosis Date . Colon cancer ([place])  . Diabetes mellitus ([place])  .  Hyperlipemia  . Hypertension  Past Surgical History: Past Surgical History: Procedure Laterality Date . IR FLUORO GUIDE CV LINE RIGHT  10/19/2020 . IR US GUIDE VASC ACCESS RIGHT  10/19/2020 . LAPAROTOMY N/A 09/01/2020  Procedure: EXPLORATORY LAPAROTOMY; Repair of Perforated Duodenal Ulcer;  Surgeon: Jesusita Oka, MD;  Location: [place];  Service: General;  Laterality: N/A; . LYSIS OF ADHESION N/A 09/01/2020  Procedure: LYSIS OF ADHESION;  Surgeon: Jesusita Oka, MD;  Location: [place];  Service: General;  Laterality: N/A; HPI: 70 year old female who was previously diagnosed with Covid 08/23/2020.  Admitted 11/2 and intubated with AF-RVR, found to have a perforated duodenal underwent exploratory laparotomy with Phillip Heal patch placement 11/3, Intubated for 20 days until 11/23 then trach placed. Prolonged ventilation, until 1/17. Profoundly deconditioned.  No data recorded Assessment / Plan / Recommendation CHL IP CLINICAL IMPRESSIONS 11/18/2020 Clinical Impression Pt demonstrates moderate oral dysphagia with lingual pumping and prolonged oral hold. Verbal encouragement needed at times to elicit swallow. Duaghter reports pt did some oral holding prior to admit. Swallow response also delayed with all boluses collecting in pyriforms prior to the swallow. Pooling of secretions also observed. THere was one instance of sensed trace aspiration with thin before/during the swallow. This improved with small cup sips. Pts pharyngeal strength is WNL. Recommend initiating a conservative diet of dys 2/nectar and advancing as pt becomes more accustomed to oral intake. Warned daughter that appetite and interest in PO may be limited initially. Will f/u for tolerance and advancement. SLP Visit Diagnosis Dysphagia, unspecified (R13.10)  Attention and concentration deficit following -- Frontal lobe and executive function deficit following -- Impact on safety and function Risk for inadequate nutrition/hydration;Mild aspiration risk   CHL IP TREATMENT RECOMMENDATION 11/18/2020 Treatment Recommendations Therapy as outlined in treatment plan below   Prognosis 11/18/2020 Prognosis for Safe Diet Advancement Good Barriers to Reach Goals -- Barriers/Prognosis Comment -- CHL IP DIET RECOMMENDATION 11/18/2020 SLP Diet Recommendations Dysphagia 2 (Fine chop) solids;Nectar thick liquid Liquid Administration via Cup;Straw Medication Administration Crushed with puree Compensations Slow rate;Small sips/bites Postural Changes Seated upright at 90 degrees;Remain semi-upright after after feeds/meals (Comment)   CHL IP OTHER RECOMMENDATIONS 11/18/2020 Recommended Consults -- Oral Care Recommendations Oral care BID Other Recommendations Place PMSV during PO intake;Have oral suction available;Order thickener from pharmacy   CHL IP FOLLOW UP RECOMMENDATIONS 11/18/2020 Follow up Recommendations 24 hour supervision/assistance;Home health SLP   CHL IP FREQUENCY AND DURATION 11/18/2020 Speech Therapy Frequency (ACUTE ONLY) min 2x/week Treatment Duration 2 weeks      CHL IP ORAL PHASE 11/18/2020 Oral Phase Impaired Oral - Pudding Teaspoon -- Oral - Pudding Cup -- Oral - Honey Teaspoon -- Oral - Honey Cup -- Oral - Nectar Teaspoon Delayed oral transit;Lingual pumping;Reduced posterior propulsion;Holding of bolus Oral - Nectar Cup Delayed oral transit;Lingual pumping;Reduced posterior propulsion;Holding of bolus Oral - Nectar Straw Delayed oral transit;Lingual pumping;Reduced posterior propulsion;Holding of bolus Oral - Thin Teaspoon -- Oral - Thin Cup Delayed oral transit;Lingual pumping;Reduced posterior propulsion;Holding of bolus Oral - Thin Straw Delayed oral transit;Lingual pumping;Reduced posterior propulsion;Holding of bolus Oral - Puree Delayed oral transit;Lingual  pumping;Reduced posterior propulsion Oral - Mech Soft Delayed oral transit;Lingual pumping;Reduced posterior propulsion;Holding of bolus Oral - Regular -- Oral - Multi-Consistency -- Oral - Pill -- Oral Phase - Comment --  CHL IP PHARYNGEAL PHASE 11/18/2020 Pharyngeal Phase Impaired Pharyngeal- Pudding Teaspoon -- Pharyngeal -- Pharyngeal- Pudding Cup -- Pharyngeal -- Pharyngeal- Honey Teaspoon -- Pharyngeal -- Pharyngeal- Honey Cup -- Pharyngeal -- Pharyngeal-  Nectar Teaspoon Delayed swallow initiation-pyriform sinuses Pharyngeal -- Pharyngeal- Nectar Cup Delayed swallow initiation-pyriform sinuses Pharyngeal -- Pharyngeal- Nectar Straw Delayed swallow initiation-pyriform sinuses Pharyngeal -- Pharyngeal- Thin Teaspoon -- Pharyngeal -- Pharyngeal- Thin Cup Delayed swallow initiation-pyriform sinuses Pharyngeal -- Pharyngeal- Thin Straw Delayed swallow initiation-pyriform sinuses;Penetration/Aspiration before swallow;Trace aspiration Pharyngeal Material enters airway, passes BELOW cords and not ejected out despite cough attempt by patient;Material does not enter airway Pharyngeal- Puree Delayed swallow initiation-pyriform sinuses Pharyngeal -- Pharyngeal- Mechanical Soft -- Pharyngeal -- Pharyngeal- Regular Delayed swallow initiation-pyriform sinuses Pharyngeal -- Pharyngeal- Multi-consistency -- Pharyngeal -- Pharyngeal- Pill -- Pharyngeal -- Pharyngeal Comment --  No flowsheet data found. Herbie Baltimore, MA CCC-SLP Acute Rehabilitation Services Pager [phone] Office [phone] [name] 11/18/2020, 11:06 AM                   Scheduled Meds: . apixaban  2.5 mg Oral BID  . [START ON 11/19/2020] B-complex with vitamin C  1 tablet Oral Daily  . chlorhexidine gluconate (MEDLINE KIT)  15 mL Mouth Rinse BID  . Chlorhexidine Gluconate Cloth  6 each Topical Daily  . [START ON 11/19/2020] cholecalciferol  2,000 Units Oral Daily  . clonazepam  0.5 mg Oral TID  . collagenase   Topical BID  .  darbepoetin (ARANESP) injection - DIALYSIS  100 mcg Intravenous Q Tue-HD  . fentaNYL  1 patch Transdermal Q72H  . guaiFENesin  15 mL Oral Q12H  . insulin aspart  0-5 Units Subcutaneous QHS  . insulin aspart  0-9 Units Subcutaneous TID WC  . insulin aspart  2 Units Subcutaneous TID WC  . insulin glargine  5 Units Subcutaneous BID  . magnesium oxide  400 mg Oral BID  . mouth rinse  15 mL Mouth Rinse QID  . metoprolol tartrate  25 mg Oral BID  . midodrine  40 mg Oral TID WC  . nutrition supplement (JUVEN)  1 packet Oral BID BM  . pantoprazole sodium  40 mg Oral BID  . QUEtiapine  100 mg Oral QHS  . [START ON 11/19/2020] QUEtiapine  50 mg Oral Daily  . sodium chloride flush  10-40 mL Intracatheter Q12H   Continuous Infusions: . sodium chloride 5 mL/hr at 11/16/20 0935  . sodium chloride    . sodium chloride    . sodium chloride    . albumin human 25 g (11/16/20 1516)     LOS: 78 days    Time spent: 35 minutes    Barb Merino, MD [facility] Pager [phone]

## 2020-11-19 ENCOUNTER — Inpatient Hospital Stay (HOSPITAL_COMMUNITY): Payer: Medicare Other

## 2020-11-19 DIAGNOSIS — I4891 Unspecified atrial fibrillation: Secondary | ICD-10-CM

## 2020-11-19 DIAGNOSIS — R000 Tachycardia, unspecified: Secondary | ICD-10-CM

## 2020-11-19 DIAGNOSIS — I959 Hypotension, unspecified: Secondary | ICD-10-CM | POA: Diagnosis not present

## 2020-11-19 LAB — ECHOCARDIOGRAM COMPLETE
Height: 68 in
S' Lateral: 2.8 cm
Weight: 3266.34 oz

## 2020-11-19 LAB — CBC
HCT: 29.8 % — ABNORMAL LOW (ref 36.0–46.0)
Hemoglobin: 8.6 g/dL — ABNORMAL LOW (ref 12.0–15.0)
MCH: 26.4 pg (ref 26.0–34.0)
MCHC: 28.9 g/dL — ABNORMAL LOW (ref 30.0–36.0)
MCV: 91.4 fL (ref 80.0–100.0)
Platelets: 352 10*3/uL (ref 150–400)
RBC: 3.26 MIL/uL — ABNORMAL LOW (ref 3.87–5.11)
RDW: 17 % — ABNORMAL HIGH (ref 11.5–15.5)
WBC: 10.4 10*3/uL (ref 4.0–10.5)
nRBC: 0 % (ref 0.0–0.2)

## 2020-11-19 LAB — GLUCOSE, CAPILLARY
Glucose-Capillary: 70 mg/dL (ref 70–99)
Glucose-Capillary: 88 mg/dL (ref 70–99)
Glucose-Capillary: 68 mg/dL — ABNORMAL LOW (ref 70–99)
Glucose-Capillary: 107 mg/dL — ABNORMAL HIGH (ref 70–99)
Glucose-Capillary: 91 mg/dL (ref 70–99)

## 2020-11-19 LAB — BASIC METABOLIC PANEL
Anion gap: 14 (ref 5–15)
BUN: 103 mg/dL — ABNORMAL HIGH (ref 8–23)
CO2: 23 mmol/L (ref 22–32)
Calcium: 10 mg/dL (ref 8.9–10.3)
Chloride: 107 mmol/L (ref 98–111)
Creatinine, Ser: 1.1 mg/dL — ABNORMAL HIGH (ref 0.44–1.00)
GFR, Estimated: 54 mL/min — ABNORMAL LOW (ref >60)
Glucose, Bld: 97 mg/dL (ref 70–99)
Potassium: 4.4 mmol/L (ref 3.5–5.1)
Sodium: 144 mmol/L (ref 135–145)

## 2020-11-19 LAB — TSH: TSH: 2.253 u[IU]/mL (ref 0.350–4.500)

## 2020-11-19 LAB — T4, FREE: Free T4: 0.94 ng/dL (ref 0.61–1.12)

## 2020-11-19 LAB — MAGNESIUM: Magnesium: 1.5 mg/dL — ABNORMAL LOW (ref 1.7–2.4)

## 2020-11-19 LAB — PHOSPHORUS: Phosphorus: 5.3 mg/dL — ABNORMAL HIGH (ref 2.5–4.6)

## 2020-11-19 MED ORDER — MAGNESIUM SULFATE 4 GM/100ML IV SOLN
4.0000 g | Freq: Once | INTRAVENOUS | Status: AC
  Administered 2020-11-19: 4 g via INTRAVENOUS
  Filled 2020-11-19: qty 100

## 2020-11-19 MED ORDER — OXYCODONE HCL 5 MG PO TABS
5.0000 mg | ORAL_TABLET | ORAL | Status: DC | PRN
  Administered 2020-11-19 – 2020-11-20 (×2): 5 mg via ORAL
  Filled 2020-11-19 (×2): qty 1

## 2020-11-19 MED ORDER — HEPARIN SODIUM (PORCINE) 1000 UNIT/ML IJ SOLN
INTRAMUSCULAR | Status: AC
  Filled 2020-11-19: qty 4

## 2020-11-19 MED ORDER — METOPROLOL TARTRATE 25 MG/10 ML ORAL SUSPENSION
50.0000 mg | Freq: Two times a day (BID) | ORAL | Status: DC
  Administered 2020-11-19 – 2020-11-20 (×2): 50 mg via ORAL
  Filled 2020-11-19 (×3): qty 20

## 2020-11-19 MED ORDER — PERFLUTREN LIPID MICROSPHERE
1.0000 mL | INTRAVENOUS | Status: AC | PRN
  Administered 2020-11-19: 2 mL via INTRAVENOUS
  Filled 2020-11-19: qty 10

## 2020-11-19 NOTE — Progress Notes (Signed)
  Echocardiogram 2D Echocardiogram has been performed.  Fidel Levy 11/19/2020, 2:22 PM

## 2020-11-19 NOTE — TOC Progression Note (Addendum)
Transition of Care [facility]) - Progression Note    Patient Details  Name: Candace [name] MRN: [id number] Date of Birth: 12/12/1950  Transition of Care [facility]) CM/SW [place], RN Phone Number: 11/19/2020, 12:57 PM  Clinical Narrative:    Case management met with the patient at the bedside regarding transitions of care while the patient was receiving hemodialysis.  Medical work up continues in the hospital and patient is not medically stable to discharge at this time.  The family is hopeful to be able to take the patient home with home health services.  Since the patient is currently receiving HD - the patient will need to be able to sit up in wheelchair for HD and be medically stable to discharge to home.  Patient was active with [facility] and [employer] for home oxygen services.  The home health agency will need to be switches to another company considering [facility] will not be able to provide care if patient discharges home with a tracheostomy.  CM and MSW will continue to follow the patient for transitions of care needs.   Expected Discharge Plan: [place] Barriers to Discharge: Continued Medical Work up  Expected Discharge Plan and Services Expected Discharge Plan: [place] In-house Referral: Clinical Social Work Discharge Planning Services: CM Consult Post Acute Care Choice: [place] arrangements for the past 2 months: Single Family Home                 DME Arranged: N/A DME Agency: NA       HH Arranged: NA HH Agency: NA         Social Determinants of Health (SDOH) Interventions    Readmission Risk Interventions Readmission Risk Prevention Plan 11/19/2020  Transportation Screening Complete  Medication Review Press photographer) Complete  PCP or Specialist appointment within 3-5 days of discharge Complete  HRI or Home Care Consult Complete  SW Recovery Care/Counseling Consult Complete  Palliative Care  Screening Complete  Skilled Nursing Facility Complete  Some recent data might be hidden

## 2020-11-19 NOTE — Progress Notes (Addendum)
Physical Therapy Wound Treatment Patient Details  Name: Candace [name] MRN: [id number] Date of Birth: 1951/06/20  Today's Date: 11/19/2020 Time: 1515-1600 Time Calculation (min): 45 min  Subjective  Subjective: Pt responsive and agreeable to wound care.  Daughter present at beginning of session.  Daughter reports they tried oral pain meds instead of Dilaudid and wants to know how pt tolerates today.  Pt tolerated well - min complaints of pain with one area of hydro but relieve when moved from that area.  Notified RN. Patient and Family Stated Goals: heal wound Date of Onset:  (unknown) Prior Treatments: prior hydro and Dakins  Pain Score:  Pt lethargic.  Had one episode of grimacing during hydro at base of wound, but eased when moved from that area.  Tolerated well.   Wound Assessment  Pressure Injury 09/25/20 Sacrum Bilateral;Medial Deep Tissue Pressure Injury - Purple or maroon localized area of discolored intact skin or blood-filled blister due to damage of underlying soft tissue from pressure and/or shear. Purple, non-blanchable, b (Active)  Dressing Type ABD;Barrier Film (skin prep);Normal saline moist dressing 11/19/20 1623  Dressing Clean;Dry;Intact 11/19/20 1623  Dressing Change Frequency Daily 11/19/20 1623  State of Healing Eschar 11/19/20 1623  Site / Wound Assessment Brown;Yellow;Pink 11/19/20 1623  % Wound base Red or Granulating 10% 11/19/20 1623  % Wound base Yellow/Fibrinous Exudate 80% 11/19/20 1623  % Wound base Black/Eschar 10% 11/19/20 1623  % Wound base Other/Granulation Tissue (Comment) 0% 11/19/20 1623  Peri-wound Assessment Intact 11/19/20 1623  Wound Length (cm) 14.2 cm 11/18/20 1500  Wound Width (cm) 12 cm 11/18/20 1500  Wound Depth (cm) 5.4 cm 11/18/20 1500  Wound Surface Area (cm^2) 170.4 cm^2 11/18/20 1500  Wound Volume (cm^3) 920.16 cm^3 11/18/20 1500  Tunneling (cm) 0 11/19/20 1623  Undermining (cm) 0 11/19/20 1623  Margins Unattached edges  (unapproximated) 11/19/20 1623  Drainage Amount Minimal 11/19/20 1623  Drainage Description Serous 11/19/20 1623  Treatment Debridement (Selective);Hydrotherapy (Pulse lavage);Packing (Saline gauze);Santyl applied to wound bed prior to applying dressing.  11/19/20 1623      Hydrotherapy Pulsed lavage therapy - wound location: sacrum Pulsed Lavage with Suction (psi): 12 psi Pulsed Lavage with Suction - Normal Saline Used: 1000 mL Pulsed Lavage Tip: Tip with splash shield Selective Debridement Selective Debridement - Location: sacrum Selective Debridement - Tools Used: Forceps;Scissors Selective Debridement - Tissue Removed: necrotic adipose, eschar, and slough at base   Wound Assessment and Plan  Wound Therapy - Assess/Plan/Recommendations Wound Therapy - Clinical Statement: Spoke with surgical PA about possible cleanse choice vac as slow progress with hydrotherapy and not surgical candidate - they were agreeable to vac - will potentially begin next week; continue hydro and debridement at this point. Pt tolerated hydrotherapy well with oral pain meds prior.  Pt continues to have unstagable pressure ulcer on injury with 90% eschar/slough.  Pt will continue to benefit from continued hydrotherapy and for selective debridement of unviable tissue in order to decrease bioburden and promote wound bed healing. Wound Therapy - Functional Problem List: Global weakness and immobility Factors Delaying/Impairing Wound Healing: Diabetes Mellitus;Immobility;Multiple medical problems;Other (comment) (poor nutrition) Hydrotherapy Plan: Debridement;Pulsatile lavage with suction;Patient/family education;Dressing change Wound Therapy - Frequency: 6X / week Wound Therapy - Follow Up Recommendations: Skilled nursing facility Wound Plan: see above  Wound Therapy Goals- Improve the function of patient's integumentary system by progressing the wound(s) through the phases of wound healing (inflammation -  proliferation - remodeling) by: Decrease Necrotic Tissue to: 50% Decrease Necrotic Tissue - Progress:  Progressing toward goal Increase Granulation Tissue to: 50% Increase Granulation Tissue - Progress: Progressing toward goal Goals/treatment plan/discharge plan were made with and agreed upon by patient/family: Yes Time For Goal Achievement: 7 days Wound Therapy - Potential for Goals: Fair  Goals will be updated until maximal potential achieved or discharge criteria met.  Discharge criteria: when goals achieved, discharge from hospital, MD decision/surgical intervention, no progress towards goals, refusal/missing three consecutive treatments without notification or medical reason.  GP   Abran Richard, PT Acute Rehab Services Pager [phone] [facility] 11/19/2020, 4:30 PM

## 2020-11-19 NOTE — Progress Notes (Signed)
Foam dressing applied to excoriated area on right inner thigh and barrier cream applied to perineum

## 2020-11-19 NOTE — Progress Notes (Addendum)
[facility] PROGRESS NOTE  [name] HFW:[id number] DOB: 16-Aug-1951 DOA: 08/31/2020 PCP: Algis Greenhouse, MD       Status: Remains inpatient appropriate because:Ongoing diagnostic testing needed not appropriate for outpatient work up, Unsafe d/c plan, IV treatments appropriate due to intensity of illness or inability to take PO and Inpatient level of care appropriate due to severity of illness   Dispo: The patient is from: Home              Anticipated d/c is to: SNF recommended-family wishes to take home- will explore LTAC option              Anticipated d/c date is: > 3 days              Patient currently is not medically stable to d/c.  Barriers to discharge: Patient continues to have persistent tachycardia likely related to severe physical deconditioning as well as ongoing tachypnea likely related to persistent metabolic acidemia from kidney disease, continues to require intermittent hemodialysis and is not physically able to/pivot to transfer to wheelchair to proceed with outpatient hemodialysis.  Code Status: Full Family Communication: Previous attendings have spoken to family regarding CODE STATUS (including intervention by ethics team) and they maintained desire to proceed with aggressive treatments and full CODE STATUS DVT prophylaxis: Eliquis Vaccination status: Has not been vaccinated but did have severe COVID infection initially diagnosed on 08/23/2020.  Patient will be eligible for COVID-vaccine after 1/20 02/2021  Foley catheter: No, purewick female urinary collection device   HPI: 70 year old female patient with prior history of diabetes, stage III chronic kidney disease, atrial fibrillation, hypertension, dyslipidemia and colon cancer that is post colectomy and colostomy.  Initially diagnosed with COVID on 08/23/20 she was admitted to the hospital due to dehydration, acute kidney injury and hypoxemic respiratory failure.  She was discharged home on 2 to 3 L of oxygen  after being treated with IV steroids and remdesivir.  During that time although she had elevated inflammatory markers she had no embolic or thrombotic disease.  She had been on prophylactic dose heparin during the hospitalization and was placed on Eliquis for 2 weeks for after discharge  Patient returned to the ER on 08/31/2020 for complaints of not feeling well.  She was found to be in atrial fibrillation with RVR, hemorrhagic shock and work-up revealed a perforated duodenal ulcer.  She underwent exploratory laparotomy on 11/3.  She has had an extensive ICU stay with significant events as below.   11/2 Admitted with rapid Afib 11/3 OR with findings of perforated duodenal ulcer 11/10 progressive hemorrhagic shock, intubated, transfused, pressors, proned; started on CRRT in PM 11/16 Extubated. Re-intubated overnight due to respiratory distress and hypoxia with decreased mentation 11/18 Bronch, cultures sent 11/19 Hgb down getting blood 11/24 Spiked fever, resume empirical antimicrobial therapy 11/26 Hemorrhagic shock, hgb 5.6, increased pressors,  11/30 Per palliative "[name] understanding that patient is unfortunately very fragiledespite ongoing intensive medical treatment and full mechanical support. [name] that the familywantsto continue with all current interventions despite potential outcomes". 12/08 CRRT discontinued due to clotting 12/09 Family requested transfer to tertiary care [facility]). [facility] denied transfer  12/10 CRRT restarted. Episodes of tachycardia, tachypnea that seem to improve with pain management 12/11 Back in shock. Pressor requirements up. CXR worse. ABX resumed 12/12 Still requiring inc pressors. Had hypoglycemic event. Basal insulin dosing adjusted 12/13 Pressor requirements better. Now hyperglycemic. Re-adjusted Glycemic control  12/19 Afebrile . Remains on dilaudid and heparin gtt, dilaudid gtt increased  overnight for concern of pain / ongoing tachycardia,  no other events . NE and precedex off 12/17. Ongoing CRRT- even UF, dosing lokelmia this morning 12/20 On CRRT. Renal plans for HD cath and moving to HD. Getting wound care 12/24 - renal stopping CRRT today and plans HD 10/24/20 . 40% fio2 on vent via Trach. TAchypenic and tachycardic. Afebrile but wbc up to 27.6K. On TF. On heparin gtt 12/25 - Back on CRRT. On vent via trach at 40% fio2, On scheduled dilaudid as add on to oxy. Per family request 12/24 - they felt scheduled oxy was not adequate and patient was showing signs of opioid withdrawal.  Patient also had worsening SIRS/sepsis syndrome. Had fever, rising wbc, worsening RR 40-60 and HR 140s sinus->started On abx yesterday. Fever some better today. WBC plateau at 28,.5K. On new levophed ->signifanct improvement in HR 77 and RR t0 20. On heparin gtt. On precedex gtt. On levophed gtt 25mg wthi midodrine. On TF 12/30 Remains on CRRT with intermittent pressor requirements. Ethics consult placed 12/29. Ethics rec time trial of CRRT 12/31 failed SBT with RR 40s. Several conversations between care tam and family, who are upset RE plan of care 1/1 back on pressors  1/4: On pressors, keeping even on CVVHD, HGB drop to 6.9, transfused 1 unit. Improving mental status 1/10 remains on low dose levophed 272m 1/11 off levo, attempting HD with UF for first time. Now tolerating intermittent HD. 1/19 patient transferred to progressive bed with tracheostomy on 35% FiO2.  has NG tube feeding.  No PEG tube.  Received dialysis on 1/18.  Subjective: Evaluated during dialysis.  Patient very weak but did interact with myself and TOC team at bedside.  Was able to ask for Prevalon boots to be removed from her feet and told usKoreahat her knees were hurting.  Objective: Vitals:   11/19/20 0400 11/19/20 0500  BP: 110/81 120/78  Pulse: (!) 121 (!) 123  Resp: (!) 26 (!) 26  Temp: 98.4 F (36.9 C)   SpO2: 99% 96%    Intake/Output Summary (Last 24 hours) at  11/19/2020 0720 Last data filed at 11/19/2020 0500 Gross per 24 hour  Intake 537 ml  Output 1625 ml  Net -1088 ml   Filed Weights   11/16/20 1500 11/16/20 1802 11/16/20 2120  Weight: 86 kg 85 kg 92.6 kg    Exam: Constitutional: NAD, calm, uncomfortable 2/2 knee pain Respiratory: clear to auscultation bilaterally, no wheezing, no crackles.  Resting tachypnea and slight increased work of breathing noted in the context of kidney disease and persistent acidemia.  Trach collar in place, FiO2 28%.  No significant secretions from trach detected Cardiovascular: Regular but tachycardic with ventricular rates up into the 130s sinus rhythm, no murmurs / rubs / gallops. No extremity edema. 2+ pedal pulses.  Extremities warm to touch Abdomen: no tenderness, no masses palpated.  Monic colostomy.  Bowel sounds positive.  Core track tube in place for tube feedings.  On D2 diet with nectar thick liquids.  Appetite listed as fair but percentage of meals not documented. LBM 1/12 Skin: no rashes, lesions, ulcers. No induration.  Patient does have massive decubitus ulcer Neurologic: CN 2-12 grossly intact. Sensation intact, DTR normal. Strength 2/5 x all 4 extremities.  Psychiatric: Normal judgment and insight. Alert and oriented x 3. Normal mood.    Assessment/Plan: Acute problems: PAF maintaining sinus rhythm w/persistent tachycardia -Currently maintaining sinus rhythm with tachycardic rates.  Ventricular response is up into the 130s -Continue  metoprolol as BP tolerates-this medication was recently started on 1/20 at 50 mg BID -Formal cardiology consult requested -Given persistent tachycardia will repeat echocardiogram to rule out tachycardia induced cardiomyopathy.  Last echo in November 2021 with hyperdynamic EF 70-75% with mild LVH and mild RV systolic dysfunction -Continue Eliquis  Abnormal TSH -Nov 2021 TSH was 0.015-will repeat TSH,Free T4 and T3 -could be etiology of tachycardia if labs c/w  hyperthyroidism  Acute respiratory failure secondary to COVID-pneumonia/tracheostomy -Patient stable on low-flow oxygen FiO2 28% -PCCM/trach team following -Continues to have excessive secretions so we will continue aggressive suctioning and chest PT-Vibra vest -Continue PMV training noting patient has passed a modified barium swallow evaluation and diet has been advanced  Acute kidney injury secondary to COVID-related sepsis with shock superimposed on stage III chronic kidney disease -Continue intermittent hemodialysis as directed by the renal team -Avoid nephrotoxic medications -Continue Aranesp on dialysis days  Dysphagia/moderate to severe protein calorie malnutrition Nutrition Status: Nutrition Problem: Increased nutrient needs Etiology: wound healing Signs/Symptoms: estimated needs Interventions: Refer to RD note for recommendations  Estimated body mass index is 31.04 kg/m as calculated from the following:   Height as of this encounter: 5' 8"  (1.727 m).   Weight as of this encounter: 92.6 kg.  -Begin calorie count and nursing staff is not documented percentage of meals eaten -If patient can maintain 50% or more of meal intake consistently will consider removing core track tube  Acute hemorrhagic shock secondary to perforated duodenal ulcer/anemia of critical illness -Stable postoperatively -Has tolerated tube feedings and oral diet without any evidence of postoperative obstructive issues which can be common after duodenal oversewing with subsequent scar tissue formation -Hemoglobin stable today 8.6 Since admission  Diabetes mellitus 2 -CBGs down in the low 90s now that off tube feedings therefore will discontinue  low-dose Lantus and meal coverage but will continue with sliding scale insulin -CBGs well controlled -HgbA1c 08/24/2020 and was 7.0 -Prior to admission patient was on metformin XR  Profound physical deconditioning/acute encephalopathy secondary to prolonged  hospital stay/orthostasis -SNF recommended but family wants to take patient home -At this juncture patient unable to sit up in bed independently much less stand pivot and rotate to get into wheelchair and would be significant fall risk and care risk at home -In addition patient would need to be able to get in wheelchair to transport to dialysis if dialysis were to be continued in the outpatient setting -Evaluating for possible LTAC placement -Patient oriented to name and place at this juncture.  Continue Seroquel and Klonopin for issues related to acute ICU delirium-hopeful can taper and discontinue especially if contributes to sedation -Continue high-dose midodrine 3 times daily  Stage IV sacral decubitus -Not present on admission -Wound care nurse following and utilizing Santyl and hydrotherapy -Surgical team consulted.  Current recommendation is against pursuing general anesthesia to undergo extensive surgical procedure-hopefully can be reevaluated in the future if wound does not heal adequately -Kreg rotational bed  -Continue Duragesic patch for pain.  Discontinue IV Dilaudid in favor of immediate release oxycodone for breakthrough pain Incision (Closed) 09/01/20 Abdomen (Active)  Date First Assessed/Time First Assessed: 09/01/20 1737   Location: Abdomen    Assessments 09/01/2020  6:25 PM 11/18/2020  6:34 PM  Dressing Type Gauze (Comment) Gauze (Comment);Moist to dry  Dressing -- Changed;Clean;Dry;Intact  Site / Wound Assessment Dressing in place / Unable to assess --  Drainage Amount None --     No Linked orders to display  Pressure Injury 09/17/20 Ear Left;Anterior;Posterior Stage 2 -  Partial thickness loss of dermis presenting as a shallow open injury with a red, pink wound bed without slough. (Active)  Date First Assessed/Time First Assessed: 09/17/20 0800   Location: Ear  Location Orientation: Left;Anterior;Posterior  Staging: Stage 2 -  Partial thickness loss of dermis  presenting as a shallow open injury with a red, pink wound bed without slough. ...    Assessments 09/17/2020  8:00 AM 11/18/2020  8:00 PM  Dressing Type Foam - Lift dressing to assess site every shift Abdominal pads  Dressing Clean;Dry;Intact Clean;Dry;Intact  Dressing Change Frequency Every 3 days --  Site / Wound Assessment Dry;Pink Dressing in place / Unable to assess  Peri-wound Assessment Intact --  Wound Length (cm) 2 cm --  Wound Width (cm) 1 cm --  Wound Depth (cm) 0.25 cm --  Wound Surface Area (cm^2) 2 cm^2 --  Wound Volume (cm^3) 0.5 cm^3 --  Margins Unattached edges (unapproximated) --  Drainage Amount Scant --  Drainage Description Serosanguineous --  Treatment Cleansed --     No Linked orders to display     Pressure Injury 09/25/20 Sacrum Bilateral;Medial Deep Tissue Pressure Injury - Purple or maroon localized area of discolored intact skin or blood-filled blister due to damage of underlying soft tissue from pressure and/or shear. Purple, non-blanchable, b (Active)  Date First Assessed/Time First Assessed: 09/25/20 2000   Location: Sacrum  Location Orientation: Bilateral;Medial  Staging: Deep Tissue Pressure Injury - Purple or maroon localized area of discolored intact skin or blood-filled blister due to damage o...    Assessments 09/25/2020  5:00 PM 11/18/2020  3:00 PM  Wound Image     Dressing Type Foam - Lift dressing to assess site every shift Abdominal binder;Barrier Film (skin prep);Normal saline moist dressing  Dressing Changed;Clean;Dry;Intact Clean;Dry;Intact  Dressing Change Frequency -- Daily  State of Healing -- Eschar  Site / Wound Assessment -- Brown;Yellow;Pink  % Wound base Red or Granulating -- 10%  % Wound base Yellow/Fibrinous Exudate -- 80%  % Wound base Black/Eschar -- 10%  % Wound base Other/Granulation Tissue (Comment) -- 0%  Peri-wound Assessment -- Intact  Wound Length (cm) -- 14.2 cm  Wound Width (cm) -- 12 cm  Wound Depth (cm) -- 5.4 cm   Wound Surface Area (cm^2) -- 170.4 cm^2  Wound Volume (cm^3) -- 920.16 cm^3  Tunneling (cm) -- 0  Undermining (cm) -- 0  Margins -- Unattached edges (unapproximated)  Drainage Amount -- Minimal  Drainage Description -- Purulent  Treatment -- Debridement (Selective);Hydrotherapy (Pulse lavage);Packing (Saline gauze)     No Linked orders to display    Hypomagnesemia -Continue oral magnesium -1/21 give 4 g magnesium IV x1 -Follow labs  Other problems: History of stage IV colon cancer -Old colostomy  DVT bilateral posterior tibial veins -Was not present during initial COVID admission -Continue Eliquis for now given severe debility and increased risk for developing recurrent DVT  Hypertension -Having issues with orthostasis requiring midodrine Prior to admission patient was on Norvasc  Dyslipidemia -Prior to admission patient was on Crestor-consider resuming soon once patient can swallow pills whole    Data Reviewed: Basic Metabolic Panel: Recent Labs  Lab 11/13/20 0353 11/13/20 1709 11/14/20 0456 11/15/20 0543 11/16/20 0456 11/17/20 0601  NA 136 138 139 141 142 138  K 3.4* 4.0 3.5 3.3* 3.4* 3.6  CL 100 103 105 106 109 104  CO2 23 22 20* 18* 17* 24  GLUCOSE 84 134* 82  109* 204* 163*  BUN 71* 94* 106* 139* 160* 80*  CREATININE 1.20* 1.44* 1.62* 1.74* 1.78* 0.95  CALCIUM 10.0 9.9 9.6 8.8* 8.3* 8.7*  MG 1.9  --   --  1.8 1.8 1.5*  PHOS 7.1*  --   --  9.3*  --  4.1   Liver Function Tests: No results for input(s): AST, ALT, ALKPHOS, BILITOT, PROT, ALBUMIN in the last 168 hours. No results for input(s): LIPASE, AMYLASE in the last 168 hours. No results for input(s): AMMONIA in the last 168 hours. CBC: Recent Labs  Lab 11/13/20 0353 11/17/20 0601  WBC 11.2* 8.9  NEUTROABS 7.8*  --   HGB 8.5* 8.3*  HCT 27.3* 28.1*  MCV 89.8 90.6  PLT 363 311   Cardiac Enzymes: No results for input(s): CKTOTAL, CKMB, CKMBINDEX, TROPONINI in the last 168 hours. BNP (last 3  results) Recent Labs    09/07/20 0118 09/08/20 0446 09/09/20 0428  BNP 216.8* 432.5* 609.7*    ProBNP (last 3 results) No results for input(s): PROBNP in the last 8760 hours.  CBG: Recent Labs  Lab 11/18/20 1155 11/18/20 1517 11/18/20 2011 11/19/20 0419 11/19/20 0652  GLUCAP 159* 116* 88 70 88    No results found for this or any previous visit (from the past 240 hour(s)).   Studies: DG Swallowing Func-Speech Pathology  Result Date: 11/18/2020 Objective Swallowing Evaluation: Type of Study: MBS-Modified Barium Swallow Study  Patient Details Name: Mikayah Joy MRN: [id number] Date of Birth: 04/29/1951 Today's Date: 11/18/2020 Time: SLP Start Time (ACUTE ONLY): 1000 -SLP Stop Time (ACUTE ONLY): 1037 SLP Time Calculation (min) (ACUTE ONLY): 37 min Past Medical History: Past Medical History: Diagnosis Date . Colon cancer ([place])  . Diabetes mellitus ([place])  . Hyperlipemia  . Hypertension  Past Surgical History: Past Surgical History: Procedure Laterality Date . IR FLUORO GUIDE CV LINE RIGHT  10/19/2020 . IR US GUIDE VASC ACCESS RIGHT  10/19/2020 . LAPAROTOMY N/A 09/01/2020  Procedure: EXPLORATORY LAPAROTOMY; Repair of Perforated Duodenal Ulcer;  Surgeon: Jesusita Oka, MD;  Location: [place];  Service: General;  Laterality: N/A; . LYSIS OF ADHESION N/A 09/01/2020  Procedure: LYSIS OF ADHESION;  Surgeon: Jesusita Oka, MD;  Location: [place];  Service: General;  Laterality: N/A; HPI: 70 year old female who was previously diagnosed with Covid 08/23/2020.  Admitted 11/2 and intubated with AF-RVR, found to have a perforated duodenal underwent exploratory laparotomy with Phillip Heal patch placement 11/3, Intubated for 20 days until 11/23 then trach placed. Prolonged ventilation, until 1/17. Profoundly deconditioned.  No data recorded Assessment / Plan / Recommendation CHL IP CLINICAL IMPRESSIONS 11/18/2020 Clinical Impression Pt demonstrates moderate oral dysphagia with lingual pumping and prolonged oral hold.  Verbal encouragement needed at times to elicit swallow. Duaghter reports pt did some oral holding prior to admit. Swallow response also delayed with all boluses collecting in pyriforms prior to the swallow. Pooling of secretions also observed. THere was one instance of sensed trace aspiration with thin before/during the swallow. This improved with small cup sips. Pts pharyngeal strength is WNL. Recommend initiating a conservative diet of dys 2/nectar and advancing as pt becomes more accustomed to oral intake. Warned daughter that appetite and interest in PO may be limited initially. Will f/u for tolerance and advancement. SLP Visit Diagnosis Dysphagia, unspecified (R13.10) Attention and concentration deficit following -- Frontal lobe and executive function deficit following -- Impact on safety and function Risk for inadequate nutrition/hydration;Mild aspiration risk   CHL IP TREATMENT RECOMMENDATION 11/18/2020 Treatment Recommendations  Therapy as outlined in treatment plan below   Prognosis 11/18/2020 Prognosis for Safe Diet Advancement Good Barriers to Reach Goals -- Barriers/Prognosis Comment -- CHL IP DIET RECOMMENDATION 11/18/2020 SLP Diet Recommendations Dysphagia 2 (Fine chop) solids;Nectar thick liquid Liquid Administration via Cup;Straw Medication Administration Crushed with puree Compensations Slow rate;Small sips/bites Postural Changes Seated upright at 90 degrees;Remain semi-upright after after feeds/meals (Comment)   CHL IP OTHER RECOMMENDATIONS 11/18/2020 Recommended Consults -- Oral Care Recommendations Oral care BID Other Recommendations Place PMSV during PO intake;Have oral suction available;Order thickener from pharmacy   CHL IP FOLLOW UP RECOMMENDATIONS 11/18/2020 Follow up Recommendations 24 hour supervision/assistance;Home health SLP   CHL IP FREQUENCY AND DURATION 11/18/2020 Speech Therapy Frequency (ACUTE ONLY) min 2x/week Treatment Duration 2 weeks      CHL IP ORAL PHASE 11/18/2020 Oral Phase  Impaired Oral - Pudding Teaspoon -- Oral - Pudding Cup -- Oral - Honey Teaspoon -- Oral - Honey Cup -- Oral - Nectar Teaspoon Delayed oral transit;Lingual pumping;Reduced posterior propulsion;Holding of bolus Oral - Nectar Cup Delayed oral transit;Lingual pumping;Reduced posterior propulsion;Holding of bolus Oral - Nectar Straw Delayed oral transit;Lingual pumping;Reduced posterior propulsion;Holding of bolus Oral - Thin Teaspoon -- Oral - Thin Cup Delayed oral transit;Lingual pumping;Reduced posterior propulsion;Holding of bolus Oral - Thin Straw Delayed oral transit;Lingual pumping;Reduced posterior propulsion;Holding of bolus Oral - Puree Delayed oral transit;Lingual pumping;Reduced posterior propulsion Oral - Mech Soft Delayed oral transit;Lingual pumping;Reduced posterior propulsion;Holding of bolus Oral - Regular -- Oral - Multi-Consistency -- Oral - Pill -- Oral Phase - Comment --  CHL IP PHARYNGEAL PHASE 11/18/2020 Pharyngeal Phase Impaired Pharyngeal- Pudding Teaspoon -- Pharyngeal -- Pharyngeal- Pudding Cup -- Pharyngeal -- Pharyngeal- Honey Teaspoon -- Pharyngeal -- Pharyngeal- Honey Cup -- Pharyngeal -- Pharyngeal- Nectar Teaspoon Delayed swallow initiation-pyriform sinuses Pharyngeal -- Pharyngeal- Nectar Cup Delayed swallow initiation-pyriform sinuses Pharyngeal -- Pharyngeal- Nectar Straw Delayed swallow initiation-pyriform sinuses Pharyngeal -- Pharyngeal- Thin Teaspoon -- Pharyngeal -- Pharyngeal- Thin Cup Delayed swallow initiation-pyriform sinuses Pharyngeal -- Pharyngeal- Thin Straw Delayed swallow initiation-pyriform sinuses;Penetration/Aspiration before swallow;Trace aspiration Pharyngeal Material enters airway, passes BELOW cords and not ejected out despite cough attempt by patient;Material does not enter airway Pharyngeal- Puree Delayed swallow initiation-pyriform sinuses Pharyngeal -- Pharyngeal- Mechanical Soft -- Pharyngeal -- Pharyngeal- Regular Delayed swallow initiation-pyriform sinuses  Pharyngeal -- Pharyngeal- Multi-consistency -- Pharyngeal -- Pharyngeal- Pill -- Pharyngeal -- Pharyngeal Comment --  No flowsheet data found. Herbie Baltimore, MA CCC-SLP Acute Rehabilitation Services Pager [phone] Office [phone] [name] 11/18/2020, 11:06 AM               Scheduled Meds: . apixaban  2.5 mg Oral BID  . chlorhexidine gluconate (MEDLINE KIT)  15 mL Mouth Rinse BID  . Chlorhexidine Gluconate Cloth  6 each Topical Daily  . cholecalciferol  2,000 Units Oral Daily  . clonazepam  0.5 mg Oral TID  . collagenase   Topical BID  . darbepoetin (ARANESP) injection - DIALYSIS  100 mcg Intravenous Q Tue-HD  . feeding supplement (NEPRO CARB STEADY)  237 mL Oral TID BM  . fentaNYL  1 patch Transdermal Q72H  . guaiFENesin  15 mL Oral Q12H  . insulin aspart  0-5 Units Subcutaneous QHS  . insulin aspart  0-9 Units Subcutaneous TID WC  . insulin aspart  2 Units Subcutaneous TID WC  . insulin glargine  5 Units Subcutaneous BID  . magnesium oxide  400 mg Oral BID  . mouth rinse  15 mL Mouth Rinse QID  . metoprolol tartrate  50 mg Oral BID  . midodrine  40 mg Oral TID WC  . multivitamin  1 tablet Oral QHS  . pantoprazole sodium  40 mg Oral BID  . QUEtiapine  100 mg Oral QHS  . QUEtiapine  50 mg Oral Daily  . sodium chloride flush  10-40 mL Intracatheter Q12H   Continuous Infusions: . sodium chloride 5 mL/hr at 11/16/20 0935  . sodium chloride    . sodium chloride    . sodium chloride    . albumin human 25 g (11/16/20 1516)    Principal Problem:   Atrial fibrillation with RVR ([place]) Active Problems:   Acute respiratory failure due to COVID-19 [facility])   New onset a-fib ([facility])   Leukocytosis   Atrial fibrillation with rapid ventricular response ([facility])   Hypoxia   Pressure injury of skin   Acute hypoxemic respiratory failure ([facility])   ARDS (adult respiratory distress syndrome) ([facility])   Perforated duodenal ulcer ([place])   On mechanically assisted ventilation ([place])    Palliative care by specialist   Goals of care, counseling/discussion   Shock ([place])   Acute and chronic respiratory failure (acute-on-chronic) ([place])   Status post tracheostomy ([place])   ESRD (end stage renal disease) [facility])   Consultants:  Cardiology  Surgery  Nephrology  Ethics  PCCM   Procedures: R PICC 11/5 >> A line 11/9 >> out ETT 11/9 > 11/16, 11/16 >> 09/21/2020, 09/21/2020 tracheostomy>> Lt [facility] CVL 11/9 >> R IJ trialysis >> out HD catheter 12/1 >>12/20 12/21 - 14.5 Fr, 23 cm right IJ tunneled hemodialysis catheter placement. Removal of indwelling subclavian catheter.   Antibiotics: Anti-infectives (From admission, onward)   Start     Dose/Rate Route Frequency Ordered Stop   11/17/20 1415  fluconazole (DIFLUCAN) 40 MG/ML suspension 200 mg        200 mg Oral  Once 11/17/20 1315 11/17/20 1357   11/03/20 2200  meropenem (MERREM) 500 mg in sodium chloride 0.9 % 100 mL IVPB        500 mg 200 mL/hr over 30 Minutes Intravenous Every 24 hours 11/03/20 1500 11/06/20 2217   11/01/20 1000  anidulafungin (ERAXIS) 100 mg in sodium chloride 0.9 % 100 mL IVPB  Status:  Discontinued       "Followed by" Linked Group Details   100 mg 78 mL/hr over 100 Minutes Intravenous Every 24 hours 10/31/20 0916 11/01/20 1404   10/31/20 1015  meropenem (MERREM) 1 g in sodium chloride 0.9 % 100 mL IVPB  Status:  Discontinued        1 g 200 mL/hr over 30 Minutes Intravenous Every 8 hours 10/31/20 0916 11/03/20 1500   10/31/20 1015  linezolid (ZYVOX) IVPB 600 mg  Status:  Discontinued        600 mg 300 mL/hr over 60 Minutes Intravenous Every 12 hours 10/31/20 0916 11/02/20 0906   10/31/20 1015  anidulafungin (ERAXIS) 200 mg in sodium chloride 0.9 % 200 mL IVPB       "Followed by" Linked Group Details   200 mg 78 mL/hr over 200 Minutes Intravenous  Once 10/31/20 0916 10/31/20 1630   10/25/20 1800  ceFAZolin (ANCEF) IVPB 2g/100 mL premix  Status:  Discontinued        2 g 200 mL/hr over 30  Minutes Intravenous Every 12 hours 10/25/20 1022 10/30/20 1103   10/23/20 2000  vancomycin (VANCOREADY) IVPB 750 mg/150 mL  Status:  Discontinued        750 mg 150 mL/hr over  60 Minutes Intravenous Every 24 hours 10/22/20 2036 10/25/20 1022   10/23/20 1800  piperacillin-tazobactam (ZOSYN) IVPB 3.375 g  Status:  Discontinued        3.375 g 100 mL/hr over 30 Minutes Intravenous Every 6 hours 10/23/20 1155 10/24/20 1422   10/23/20 0200  piperacillin-tazobactam (ZOSYN) IVPB 3.375 g  Status:  Discontinued        3.375 g 100 mL/hr over 30 Minutes Intravenous Every 8 hours 10/22/20 2036 10/23/20 1155   10/22/20 1630  vancomycin (VANCOREADY) IVPB 1500 mg/300 mL        1,500 mg 150 mL/hr over 120 Minutes Intravenous  Once 10/22/20 1537 10/22/20 2229   10/22/20 1630  piperacillin-tazobactam (ZOSYN) IVPB 2.25 g  Status:  Discontinued        2.25 g 100 mL/hr over 30 Minutes Intravenous Every 8 hours 10/22/20 1537 10/22/20 2036   10/22/20 1537  vancomycin variable dose per unstable renal function (pharmacist dosing)  Status:  Discontinued         Does not apply See admin instructions 10/22/20 1537 10/22/20 2036   10/19/20 0600  ceFAZolin (ANCEF) IVPB 2g/100 mL premix        2 g 200 mL/hr over 30 Minutes Intravenous To Radiology 10/18/20 1457 10/19/20 0938   10/10/20 0830  vancomycin (VANCOCIN) IVPB 1000 mg/200 mL premix       "Followed by" Linked Group Details   1,000 mg 200 mL/hr over 60 Minutes Intravenous Every 24 hours 10/09/20 0744 10/15/20 0902   10/09/20 0830  piperacillin-tazobactam (ZOSYN) IVPB 3.375 g        3.375 g 100 mL/hr over 30 Minutes Intravenous Every 6 hours 10/09/20 0744 10/16/20 0038   10/09/20 0830  vancomycin (VANCOREADY) IVPB 2000 mg/400 mL       "Followed by" Linked Group Details   2,000 mg 200 mL/hr over 120 Minutes Intravenous  Once 10/09/20 0744 10/09/20 1022   09/24/20 1000  ceFAZolin (ANCEF) IVPB 2g/100 mL premix  Status:  Discontinued        2 g 200 mL/hr over 30  Minutes Intravenous Every 12 hours 09/24/20 0801 09/24/20 1046   09/23/20 1400  vancomycin (VANCOCIN) IVPB 1000 mg/200 mL premix        1,000 mg 200 mL/hr over 60 Minutes Intravenous Every 24 hours 09/22/20 1436 09/28/20 1718   09/22/20 2200  ceFEPIme (MAXIPIME) 2 g in sodium chloride 0.9 % 100 mL IVPB  Status:  Discontinued        2 g 200 mL/hr over 30 Minutes Intravenous Every 12 hours 09/22/20 1436 09/24/20 0801   09/22/20 1030  ceFEPIme (MAXIPIME) 1 g in sodium chloride 0.9 % 100 mL IVPB        1 g 200 mL/hr over 30 Minutes Intravenous  Once 09/22/20 0934 09/22/20 1145   09/22/20 1015  vancomycin (VANCOCIN) IVPB 1000 mg/200 mL premix        1,000 mg 200 mL/hr over 60 Minutes Intravenous  Once 09/22/20 0934 09/22/20 1446   09/12/20 2200  ceFEPIme (MAXIPIME) 2 g in sodium chloride 0.9 % 100 mL IVPB        2 g 200 mL/hr over 30 Minutes Intravenous Every 12 hours 09/12/20 0732 09/14/20 2134   09/11/20 1400  ceFEPIme (MAXIPIME) 2 g in sodium chloride 0.9 % 100 mL IVPB  Status:  Discontinued        2 g 200 mL/hr over 30 Minutes Intravenous Every 8 hours 09/11/20 1126 09/12/20 0732   09/08/20 1000  vancomycin (VANCOREADY) IVPB 2000 mg/400 mL        2,000 mg 200 mL/hr over 120 Minutes Intravenous  Once 09/08/20 0857 09/08/20 1224   09/08/20 1000  ceFEPIme (MAXIPIME) 2 g in sodium chloride 0.9 % 100 mL IVPB  Status:  Discontinued        2 g 200 mL/hr over 30 Minutes Intravenous Every 12 hours 09/08/20 0857 09/11/20 1126   09/08/20 0856  vancomycin variable dose per unstable renal function (pharmacist dosing)  Status:  Discontinued         Does not apply See admin instructions 09/08/20 0857 09/09/20 0935   09/02/20 1600  cefTRIAXone (ROCEPHIN) 1 g in sodium chloride 0.9 % 100 mL IVPB  Status:  Discontinued        1 g 200 mL/hr over 30 Minutes Intravenous Every 24 hours 09/01/20 1811 09/02/20 0838   09/01/20 1800  fluconazole (DIFLUCAN) IVPB 400 mg        400 mg 50 mL/hr over 240 Minutes  Intravenous  Once 09/01/20 1749 09/02/20 0603   09/01/20 1530  piperacillin-tazobactam (ZOSYN) IVPB 3.375 g        3.375 g 12.5 mL/hr over 240 Minutes Intravenous Every 8 hours 09/01/20 1514 09/05/20 2111   09/01/20 1000  levofloxacin (LEVAQUIN) tablet 250 mg  Status:  Discontinued        250 mg Oral Daily 08/31/20 1508 08/31/20 1735   08/31/20 1730  cefTRIAXone (ROCEPHIN) 1 g in sodium chloride 0.9 % 100 mL IVPB  Status:  Discontinued        1 g 200 mL/hr over 30 Minutes Intravenous Every 24 hours 08/31/20 1726 09/01/20 1513   08/31/20 1730  azithromycin (ZITHROMAX) 500 mg in sodium chloride 0.9 % 250 mL IVPB  Status:  Discontinued        500 mg 250 mL/hr over 60 Minutes Intravenous Every 24 hours 08/31/20 1726 09/02/20 0838        Time spent: 40 minutes    [name] ANP  [facility] 7 am - 330 pm/M-F for direct patient care and secure chat Please refer to Amion for contact info 79  days

## 2020-11-19 NOTE — Consult Note (Addendum)
Cardiology Consultation:   Patient ID: Candace [name] MRN: [id number]; DOB: 03/04/1951  Admit date: 08/31/2020 Date of Consult: 11/19/2020  Primary Care Provider: Algis Greenhouse, MD [facility] Cardiologist: No primary care provider on file. new lives in [place] if discharged should go to one of cardiologists in [place]. [facility] Electrophysiologist:  None    Patient Profile:   Candace [name] is a 70 y.o. female with a hx of COVID 19 07/2020 and discharged on 02 and eliquis due to elevated D-dimer, obesity, clon cander s/p colon resection with colostomy bag, HLD, NIDDM-2, CKD-3 and admitted this admit 08/31/20 with afib RVR and acute respiratory failure,  also abd tenderness pneumoperitoneum, perforated duodenal ulcer with surgery 09/01/20 and has remained in hospital since that time,who is being seen today for the evaluation of atrial fib at the request of Ghimire.  History of Present Illness:   Ms. Brossard with above hx and admitted 08/31/20 with new atrial fib, she was on eliquis for elevated ddimer after COVID-19 PNA, but also with perforated duodenal ulcer and underwent exploratory lap, adhesiolysis, modified graham patch repair of perforated ulcer on 09/01/20.  She had been maintained on IV dilt and IV metoprolol, and lovenox.  By the 9th of Nov was hypotensive and tachycardic and was bleeding from incision.  IV fluids given, and she was started on amiodarone.   EF at that time 70-75%, LVH-mild, poor image of RV.  Trivial MR.  She did convert to SR on 09/05/20. And was in [place] - her amiodarone was stopped.    Some bradycardia 09/28/20 required atropine.  After second episode she went into atrial fib then back to SR.  She stabilized from bleeding standpoint and was able to have IV heparin on 10/05/20 and has been changed to Eliquis.     09/07/20 had acute bleed and respiratory failure was intubated, pressors added and pt sedated.  She was also diagnosed with progressive COVID PNA/ARDS.  Unable  to stay wean vent any length of time and had trach 09/21/20.    She developed AKI and nephrology saw and by 09/08/20 CRRT was started with anuria and hyperkalemia.  She remained on CRRT until 09/22/20 and went on HD but did not tolerate then.  She also had acute liver injury and thrombocytopenia.  09/12/20 DVTs bil, age indeterminate but not present 08/24/20 after reevaluating ultrasound of lower extremities it was suggested patient does not have femoral/popliteal DVTs, so IVC filter was not placed.    Her course remained rocky through Dec. 2021 with episodes of shock. She remained on CRRT until 11/03/20 and started HD on 11/04/20.    Transferred out of ICU on day 77 on 11/17/20.  She had been weaned off vent and pressors. Continue trach for now.  Is tolerating HD.  11/19/19 Her HR was up and BB was started daily instead of PRN.   Rhythm ST   EKG:  The EKG was personally reviewed and demonstrates:  SR at 137 and no acute changes except increase of HR.  Telemetry:  Telemetry was personally reviewed and demonstrates:  ST  CBC 11/17/20 Hgb 8.3 WBC 8.9 and plts 311  Na 144 K+ 4.4 BUN 103 Cr 1.10 Mg+ 1.5    BP 118/57 P 138 R 26 to 31  Currently just completed dialysis, she denies pain or SOB.  Resting comfortable.  RN stated HR improved some after suctioning.  Pt denies any prior cardiac hx and I did not find any.  On CTA of chest  noted to have mild coronary atherosclerosis of LAD, and calcifications of aortic arch.   Past Medical History:  Diagnosis Date   Colon cancer ([place])    Diabetes mellitus ([place])    Hyperlipemia    Hypertension     Past Surgical History:  Procedure Laterality Date   IR FLUORO GUIDE CV LINE RIGHT  10/19/2020   IR US GUIDE VASC ACCESS RIGHT  10/19/2020   LAPAROTOMY N/A 09/01/2020   Procedure: EXPLORATORY LAPAROTOMY; Repair of Perforated Duodenal Ulcer;  Surgeon: Jesusita Oka, MD;  Location: [place];  Service: General;  Laterality: N/A;   LYSIS OF ADHESION N/A 09/01/2020    Procedure: LYSIS OF ADHESION;  Surgeon: Jesusita Oka, MD;  Location: [place];  Service: General;  Laterality: N/A;     Home Medications:  Prior to Admission medications   Medication Sig Start Date End Date Taking? Authorizing Provider  acetaminophen (TYLENOL) 500 MG tablet Take 1,000 mg by mouth every 6 (six) hours as needed for mild pain.   Yes [provider]  albuterol (VENTOLIN HFA) 108 (90 Base) MCG/ACT inhaler Inhale 2 puffs into the lungs every 6 (six) hours as needed for wheezing or shortness of breath. 08/29/20  Yes Thurnell Lose, MD  amLODipine (NORVASC) 10 MG tablet Take 1 tablet (10 mg total) by mouth daily. 08/29/20 08/29/21 Yes Thurnell Lose, MD  apixaban (ELIQUIS) 2.5 MG TABS tablet Take 1 tablet (2.5 mg total) by mouth 2 (two) times daily. 08/29/20  Yes Thurnell Lose, MD  aspirin 81 MG EC tablet Take 81 mg by mouth daily. 06/20/18  Yes [provider]  levofloxacin (LEVAQUIN) 250 MG tablet Take 1 tablet (250 mg total) by mouth daily. 08/29/20  Yes Thurnell Lose, MD  metFORMIN (GLUCOPHAGE-XR) 500 MG 24 hr tablet Take 500 mg by mouth in the morning and at bedtime.  07/16/20  Yes [provider]  rosuvastatin (CRESTOR) 40 MG tablet Take 40 mg by mouth daily. 06/22/20  Yes [provider]  zinc gluconate 50 MG tablet Take 50 mg by mouth daily.   Yes [provider]    Inpatient Medications: Scheduled Meds:  apixaban  2.5 mg Oral BID   chlorhexidine gluconate (MEDLINE KIT)  15 mL Mouth Rinse BID   Chlorhexidine Gluconate Cloth  6 each Topical Daily   cholecalciferol  2,000 Units Oral Daily   clonazepam  0.5 mg Oral TID   collagenase   Topical BID   darbepoetin (ARANESP) injection - DIALYSIS  100 mcg Intravenous Q Tue-HD   feeding supplement (NEPRO CARB STEADY)  237 mL Oral TID BM   fentaNYL  1 patch Transdermal Q72H   guaiFENesin  15 mL Oral Q12H   heparin sodium (porcine)       insulin aspart  0-5 Units Subcutaneous  QHS   insulin aspart  0-9 Units Subcutaneous TID WC   insulin aspart  2 Units Subcutaneous TID WC   insulin glargine  5 Units Subcutaneous BID   magnesium oxide  400 mg Oral BID   mouth rinse  15 mL Mouth Rinse QID   metoprolol tartrate  50 mg Oral BID   midodrine  40 mg Oral TID WC   multivitamin  1 tablet Oral QHS   pantoprazole sodium  40 mg Oral BID   QUEtiapine  100 mg Oral QHS   QUEtiapine  50 mg Oral Daily   sodium chloride flush  10-40 mL Intracatheter Q12H   Continuous Infusions:  sodium chloride  5 mL/hr at 11/16/20 0935   sodium chloride     sodium chloride     sodium chloride     albumin human 25 g (11/16/20 1516)   magnesium sulfate bolus IVPB     PRN Meds: sodium chloride, [CANCELED] Place/Maintain arterial line **AND** sodium chloride, sodium chloride, sodium chloride, acetaminophen (TYLENOL) oral liquid 160 mg/5 mL, albumin human, albuterol, alteplase, bacitracin, docusate, heparin, lidocaine (PF), lidocaine-prilocaine, metoprolol tartrate, ondansetron (ZOFRAN) IV, oxyCODONE, pentafluoroprop-tetrafluoroeth, phenol, polyethylene glycol, Resource ThickenUp Clear, sodium chloride, sodium chloride flush  Allergies:   No Known Allergies  Social History:   Social History   Socioeconomic History   Marital status: Married    Spouse name: Not on file   Number of children: Not on file   Years of education: Not on file   Highest education level: Not on file  Occupational History   Not on file  Tobacco Use   Smoking status: Never Smoker   Smokeless tobacco: Never Used  Vaping Use   Vaping Use: Never used  Substance and Sexual Activity   Alcohol use: Never   Drug use: Never   Sexual activity: Not on file  Other Topics Concern   Not on file  Social History Narrative   Not on file   Social Determinants of Health   Financial Resource Strain: Not on file  Food Insecurity: Not on file  Transportation Needs: Not on file  Physical Activity: Not on file  Stress:  Not on file  Social Connections: Not on file  Intimate Partner Violence: Not on file    Family History:    Family History  Problem Relation Age of Onset   Clotting disorder Mother        "died of a blood clot"   Alzheimer's disease Father      ROS:  Please see the history of present illness.  General:no colds or fevers- COVID in 07/2020,  weight down 15 lbs since admit Skin:no rashes + ulcers on inside of rt inner thigh monitored and sacrum area again monitored  HEENT:no blurred vision, no congestion CV:see HPI PUL:see HPI GI:no diarrhea constipation or melena, no indigestion, has feeding tube GU:no hematuria, no dysuria MS:no joint pain, no claudication Neuro:no syncope, no lightheadedness- over last 2 months episodic AMS several episodes of shock now off pressors   Endo:+ diabetes managed by CCM and now IM for last 2 months, no thyroid disease  All other ROS reviewed and negative.     Physical Exam/Data:   Vitals:   11/19/20 0900 11/19/20 0930 11/19/20 1000 11/19/20 1030  BP: 116/86 106/69 120/85 (!) 118/57  Pulse: (!) 134 (!) 137 69   Resp: (!) 26     Temp:      TempSrc:      SpO2: 100% 100% 100%   Weight:      Height:        Intake/Output Summary (Last 24 hours) at 11/19/2020 1128 Last data filed at 11/19/2020 0500 Gross per 24 hour  Intake 337 ml  Output 1375 ml  Net -1038 ml   Last 3 Weights 11/19/2020 11/16/2020 11/16/2020  Weight (lbs) (No Data) 204 lb 2.3 oz 187 lb 6.3 oz  Weight (kg) (No Data) 92.6 kg 85 kg     Body mass index is 31.04 kg/m.  General:  female in no acute distress, loss of muscle mass in legs HEENT: normal Lymph: no adenopathy Neck: no JVD, trach in place pale yellow mucus Endocrine:  No thryomegaly Vascular: No  carotid bruits; pedal pulses 1+ bilaterally  Cardiac:  normal S1, S2; RRR; no murmur but rapid Lungs:  clear to auscultation bilaterally, ant. no wheezing, rhonchi or rales  Abd: soft, nontender, no hepatomegaly  Ext: no  edema Musculoskeletal:  Loss of muscle mass in leg, BUE and BLE strength normal and equal but very weak Skin: warm and dry  Neuro:  Awake, follows commands no focal abnormalities noted Psych:  Normal to flat affect     Relevant CV Studies: Echo 09/01/20 IMPRESSIONS     1. Left ventricular ejection fraction, by estimation, is 70 to 75%. The  left ventricle has hyperdynamic function. Left ventricular endocardial  border not optimally defined to evaluate regional wall motion, but appears  grossly normal. There is mild left  ventricular hypertrophy.   2. RV is not well visualized due to image quality. Right ventricular  systolic function is mildly reduced. The right ventricular size is mildly  enlarged. TR signal inadequate for assessing RVSP.   3. The mitral valve is normal in structure. Trivial mitral valve  regurgitation. No evidence of mitral stenosis.   4. The aortic valve is tricuspid. Aortic valve regurgitation is not  visualized. No aortic stenosis is present.   5. The inferior vena cava is normal in size with greater than 50%  respiratory variability, suggesting right atrial pressure of 3 mmHg.   FINDINGS   Left Ventricle: Midcavitary gradient 17.5 mmHg, Vmax 2 m/s. Left  ventricular ejection fraction, by estimation, is 70 to 75%. The left  ventricle has hyperdynamic function. Left ventricular endocardial border  not optimally defined to evaluate regional  wall motion. The left ventricular internal cavity size was normal in size.  There is mild left ventricular hypertrophy. Left ventricular diastolic  parameters are indeterminate.   Right Ventricle: RV is not well visualized. The right ventricular size is  mildly enlarged. No increase in right ventricular wall thickness. Right  ventricular systolic function is mildly reduced. Tricuspid regurgitation  signal is inadequate for assessing  PA pressure.   Left Atrium: Left atrial size was normal in size.   Right Atrium:  Right atrial size was normal in size.   Pericardium: There is no evidence of pericardial effusion.   Mitral Valve: The mitral valve is normal in structure. Trivial mitral  valve regurgitation. No evidence of mitral valve stenosis.   Tricuspid Valve: The tricuspid valve is normal in structure. Tricuspid  valve regurgitation is not demonstrated. No evidence of tricuspid  stenosis.   Aortic Valve: The aortic valve is tricuspid. Aortic valve regurgitation is  not visualized. No aortic stenosis is present.   Pulmonic Valve: The pulmonic valve was grossly normal. Pulmonic valve  regurgitation is trivial. No evidence of pulmonic stenosis.   Aorta: The aortic root is normal in size and structure.   Venous: The inferior vena cava is normal in size with greater than 50%  respiratory variability, suggesting right atrial pressure of 3 mmHg.   IAS/Shunts: No atrial level shunt detected by color flow Doppler.      LEFT VENTRICLE  PLAX 2D  LVIDd:         3.45 cm  LVIDs:         2.30 cm  LV PW:         1.20 cm  LV IVS:        1.15 cm  LVOT diam:     2.20 cm  LV SV:         70  LV SV  Index:   33  LVOT Area:     3.80 cm      LEFT ATRIUM           Index  LA diam:      3.20 cm 1.50 cm/m  LA Vol (A2C): 32.9 ml 15.46 ml/m   AORTIC VALVE  LVOT Vmax:   101.00 cm/s  LVOT Vmean:  85.000 cm/s  LVOT VTI:    0.185 m     AORTA  Ao Root diam: 2.90 cm   MITRAL VALVE  MV Area (PHT): 2.95 cm    SHUNTS  MV Decel Time: 257 msec    Systemic VTI:  0.18 m  MV E velocity: 78.00 cm/s  Systemic Diam: 2.20 cm   Laboratory Data:  High Sensitivity Troponin:  No results for input(s): TROPONINIHS in the last 720 hours.   Chemistry Recent Labs  Lab 11/16/20 0456 11/17/20 0601 11/19/20 0627  NA 142 138 144  K 3.4* 3.6 4.4  CL 109 104 107  CO2 17* 24 23  GLUCOSE 204* 163* 97  BUN 160* 80* 103*  CREATININE 1.78* 0.95 1.10*  CALCIUM 8.3* 8.7* 10.0  GFRNONAA 31* >60 54*  ANIONGAP 16* 10 14     No results for input(s): PROT, ALBUMIN, AST, ALT, ALKPHOS, BILITOT in the last 168 hours. Hematology Recent Labs  Lab 11/13/20 0353 11/17/20 0601 11/19/20 1100  WBC 11.2* 8.9 10.4  RBC 3.04* 3.10* 3.26*  HGB 8.5* 8.3* 8.6*  HCT 27.3* 28.1* 29.8*  MCV 89.8 90.6 91.4  MCH 28.0 26.8 26.4  MCHC 31.1 29.5* 28.9*  RDW 19.2* 17.4* 17.0*  PLT 363 311 352   BNPNo results for input(s): BNP, PROBNP in the last 168 hours.  DDimer No results for input(s): DDIMER in the last 168 hours.   Radiology/Studies:  DG Swallowing Func-Speech Pathology  Result Date: 11/18/2020 Objective Swallowing Evaluation: Type of Study: MBS-Modified Barium Swallow Study  Patient Details Name: Idalis Hoelting MRN: [id number] Date of Birth: May 02, 1951 Today's Date: 11/18/2020 Time: SLP Start Time (ACUTE ONLY): 1000 -SLP Stop Time (ACUTE ONLY): 1037 SLP Time Calculation (min) (ACUTE ONLY): 37 min Past Medical History: Past Medical History: Diagnosis Date  Colon cancer ([place])   Diabetes mellitus ([place])   Hyperlipemia   Hypertension  Past Surgical History: Past Surgical History: Procedure Laterality Date  IR FLUORO GUIDE CV LINE RIGHT  10/19/2020  IR US GUIDE VASC ACCESS RIGHT  10/19/2020  LAPAROTOMY N/A 09/01/2020  Procedure: EXPLORATORY LAPAROTOMY; Repair of Perforated Duodenal Ulcer;  Surgeon: Jesusita Oka, MD;  Location: [place];  Service: General;  Laterality: N/A;  LYSIS OF ADHESION N/A 09/01/2020  Procedure: LYSIS OF ADHESION;  Surgeon: Jesusita Oka, MD;  Location: [place];  Service: General;  Laterality: N/A; HPI: 70 year old female who was previously diagnosed with Covid 08/23/2020.  Admitted 11/2 and intubated with AF-RVR, found to have a perforated duodenal underwent exploratory laparotomy with Phillip Heal patch placement 11/3, Intubated for 20 days until 11/23 then trach placed. Prolonged ventilation, until 1/17. Profoundly deconditioned.  No data recorded Assessment / Plan / Recommendation CHL IP CLINICAL IMPRESSIONS 11/18/2020  Clinical Impression Pt demonstrates moderate oral dysphagia with lingual pumping and prolonged oral hold. Verbal encouragement needed at times to elicit swallow. Duaghter reports pt did some oral holding prior to admit. Swallow response also delayed with all boluses collecting in pyriforms prior to the swallow. Pooling of secretions also observed. THere was one instance of sensed trace aspiration with thin before/during the swallow. This improved  with small cup sips. Pts pharyngeal strength is WNL. Recommend initiating a conservative diet of dys 2/nectar and advancing as pt becomes more accustomed to oral intake. Warned daughter that appetite and interest in PO may be limited initially. Will f/u for tolerance and advancement. SLP Visit Diagnosis Dysphagia, unspecified (R13.10) Attention and concentration deficit following -- Frontal lobe and executive function deficit following -- Impact on safety and function Risk for inadequate nutrition/hydration;Mild aspiration risk   CHL IP TREATMENT RECOMMENDATION 11/18/2020 Treatment Recommendations Therapy as outlined in treatment plan below   Prognosis 11/18/2020 Prognosis for Safe Diet Advancement Good Barriers to Reach Goals -- Barriers/Prognosis Comment -- CHL IP DIET RECOMMENDATION 11/18/2020 SLP Diet Recommendations Dysphagia 2 (Fine chop) solids;Nectar thick liquid Liquid Administration via Cup;Straw Medication Administration Crushed with puree Compensations Slow rate;Small sips/bites Postural Changes Seated upright at 90 degrees;Remain semi-upright after after feeds/meals (Comment)   CHL IP OTHER RECOMMENDATIONS 11/18/2020 Recommended Consults -- Oral Care Recommendations Oral care BID Other Recommendations Place PMSV during PO intake;Have oral suction available;Order thickener from pharmacy   CHL IP FOLLOW UP RECOMMENDATIONS 11/18/2020 Follow up Recommendations 24 hour supervision/assistance;Home health SLP   CHL IP FREQUENCY AND DURATION 11/18/2020 Speech Therapy  Frequency (ACUTE ONLY) min 2x/week Treatment Duration 2 weeks      CHL IP ORAL PHASE 11/18/2020 Oral Phase Impaired Oral - Pudding Teaspoon -- Oral - Pudding Cup -- Oral - Honey Teaspoon -- Oral - Honey Cup -- Oral - Nectar Teaspoon Delayed oral transit;Lingual pumping;Reduced posterior propulsion;Holding of bolus Oral - Nectar Cup Delayed oral transit;Lingual pumping;Reduced posterior propulsion;Holding of bolus Oral - Nectar Straw Delayed oral transit;Lingual pumping;Reduced posterior propulsion;Holding of bolus Oral - Thin Teaspoon -- Oral - Thin Cup Delayed oral transit;Lingual pumping;Reduced posterior propulsion;Holding of bolus Oral - Thin Straw Delayed oral transit;Lingual pumping;Reduced posterior propulsion;Holding of bolus Oral - Puree Delayed oral transit;Lingual pumping;Reduced posterior propulsion Oral - Mech Soft Delayed oral transit;Lingual pumping;Reduced posterior propulsion;Holding of bolus Oral - Regular -- Oral - Multi-Consistency -- Oral - Pill -- Oral Phase - Comment --  CHL IP PHARYNGEAL PHASE 11/18/2020 Pharyngeal Phase Impaired Pharyngeal- Pudding Teaspoon -- Pharyngeal -- Pharyngeal- Pudding Cup -- Pharyngeal -- Pharyngeal- Honey Teaspoon -- Pharyngeal -- Pharyngeal- Honey Cup -- Pharyngeal -- Pharyngeal- Nectar Teaspoon Delayed swallow initiation-pyriform sinuses Pharyngeal -- Pharyngeal- Nectar Cup Delayed swallow initiation-pyriform sinuses Pharyngeal -- Pharyngeal- Nectar Straw Delayed swallow initiation-pyriform sinuses Pharyngeal -- Pharyngeal- Thin Teaspoon -- Pharyngeal -- Pharyngeal- Thin Cup Delayed swallow initiation-pyriform sinuses Pharyngeal -- Pharyngeal- Thin Straw Delayed swallow initiation-pyriform sinuses;Penetration/Aspiration before swallow;Trace aspiration Pharyngeal Material enters airway, passes BELOW cords and not ejected out despite cough attempt by patient;Material does not enter airway Pharyngeal- Puree Delayed swallow initiation-pyriform sinuses Pharyngeal --  Pharyngeal- Mechanical Soft -- Pharyngeal -- Pharyngeal- Regular Delayed swallow initiation-pyriform sinuses Pharyngeal -- Pharyngeal- Multi-consistency -- Pharyngeal -- Pharyngeal- Pill -- Pharyngeal -- Pharyngeal Comment --  No flowsheet data found. Herbie Baltimore, MA CCC-SLP Acute Rehabilitation Services Pager [phone] Office [phone] [name] 11/18/2020, 11:06 AM                Assessment and Plan:   Sinus tach with higher HR than prior, checked CBC - may have underlying cause. WBC stable,  Until yesterday was on lopressor prn IV now on lopressor 50 mg BID.  Still prn order.   Echo has been ordered - one done in Nov 2021 with normal EF.  Hgb 8.6 WBC 10.4 plts 352  Coronary calcification in LAD on CTA of chest but  no chest pain and no EKG changes to suggest ischemia.  Echo ordered and helpful to re-eval EF after numerous episodes of shock in ICU. PAF on admit with perforated duodenal ulcer -did convert to SR/ST by 09/07/20 and may have brief episodes of a fib during her ICU stay. But mostly maintained SR to ST  - she was discharged in 07/2020 on eliquis for elevated ddimer. But now with PAF and CHA2DS2VASc of 4 would continue.  At beginning of hospital stay in Nov 2021 she could not be anticoagulated due to acute anemia and hemorrhagic shock.   Has been on anticoagulation with Heparin or Eliquis since 10/05/20.  Monitor for  bleeding.  Stable H/H today  TSH has been ordered  - Keep K+ 4.0 and Mg+ 2.0 Hypotension has been on pressors most of hospitalization and midodrine 40 mg TID. Now off pressors and tolerating HD. Per renal and IM AKI with CRRT most of hospitalization now on HD per Renal  Respiratory failure with COVID PNA, ARDS  IDDM per IM though at time now hypoglycemic per IM  Hypo magnesium, orders written to replace  Anemia from chronic disease, hemorrhage, surgery.  IM following and stable ( rec'd 12 units of PRBCs last was on 11/11/20)  Admitted with perforated duodenal  ulcer s/p exlap and graham patch placement  hx stage IV colon cancer has colostomy  Stage IV sacral decubitus ulcer followed by wound care.  Severe deconditioning. PT/OT have been following.   Thrombocytopenia resolved DVTs bil, age indeterminate but not present 08/24/20 after reevaluating ultrasound of lower extremities it was suggested patient does not have femoral/popliteal DVTs, so IVC filter was not placed.     Risk Assessment/Risk Scores:          CHA2DS2-VASc Score = 4  This indicates a 4.8% annual risk of stroke. The patient's score is based upon: CHF History: No HTN History: Yes Diabetes History: Yes Stroke History: No Vascular Disease History: No Age Score: 1 Gender Score: 1         For questions or updates, please contact [place] Please consult [email or web address] for contact info under   Signed, Cecilie Kicks, NP  11/19/2020 11:28 AM  Personally seen and examined. Agree with APP above with the following comments: Briefly 70 yo F that appeared to have atrial fibrillation in the setting of a perforated duodenal ulcer in the setting of abdominal surgery (see 08/31/20).  Has had a very complicated course in which she required trach; AKI and need for prolonged CVVHD  Patient's daughter notes that her had been sick a lot and worries that the stress of it all is causing her heart rate Exam notable for regular tachycardia with no RV heave  Echocardiogram started during exam:  normal global LV function, no significant AR and MR in views obtained, full study pending Reviewed EKGS from 07/2020 to presents:  Predominantely SR and sinus tachycardia Likely this is reactive Sinus tachycardia from months of deconditioning form her multiple illnesses.  She has had perioperative AF as well.  Present BB dose is reasonable:  would be cautious against too high increase in BB as it could lead to hypotension with HD, already and inssue through course on high dose midodrine.  Will follow  full echocardiogram when complete.  Discuss with daughter at length.  Werner Lean, MD

## 2020-11-19 NOTE — Progress Notes (Addendum)
Candace [name] Progress Note     Assessment:   1. HD dep't AKI.CRRT stopped 11/03/2020.[place] placed on 10/19/20. Family desired ongoing full scope of care, Now on IHD, plan for HD today. 2. SVT 3. Hyperkalemia; stable 4. H/o mixed shock: hemorrhagic and septic shock; stable 5. Anemia; CTM, on ESA 6. Perforated duodenal ulcer s/p exlap and graham patch placement 7. 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 ARDS 2/2 COVID-19 Pneumonia 9. A.fib/ DVT 10. Severe calorie malnutrition 11. H/o stage IV colon cancer 12. Hypercalcemia- immobility, stable 13. Metabolic Acidosis, mild, trend with HD  PLAN: - HD today (moved from yesterday): 3h, UF as tolerated, 2K, no heparin.   Subjective:    Seen and examined at bedside.  She has feeding tube.  Alert awake.  Unable to get dialysis yesterday therefore plan for HD today.  No new event.    Objective:   BP 120/78   Pulse (!) 123   Temp 98.4 F (36.9 C) (Axillary)   Resp (!) 26   Ht 5' 8"  (1.727 m)   Wt 92.6 kg   SpO2 96%   BMI 31.04 kg/m   Intake/Output Summary (Last 24 hours) at 11/19/2020 2542 Last data filed at 11/19/2020 0500 Gross per 24 hour  Intake 537 ml  Output 1625 ml  Net -1088 ml   Weight change:   Physical Exam: Gen: Chronically ill looking female lying on bed comfortable HEENT: trach in place CVS: tachy, S1-S2 normal Resp: unlabored, trach Abd: soft, nontender Ext:  No edema appreciated Neuro: follows commands, awake, slow to respond [facility]ta D[name]  Imaging: DG Swallowing Func-Speech Pathology  Result Date: 11/18/2020 Objective Swallowing Evaluation: Type of Study: MBS-Modified Barium Swallow Study  Patient Details Name: Candace [name] MRN: [id number] Date of Birth: 1950/12/13 Today's Date: 11/18/2020 Time: SLP Start Time (ACUTE ONLY): 1000 -SLP Stop Time (ACUTE ONLY): 1037 SLP Time Calculation (min) (ACUTE ONLY): 37 min Past Medical History: Past Medical History: Diagnosis Date . Colon cancer ([place])  .  Diabetes mellitus ([place])  . Hyperlipemia  . Hypertension  Past Surgical History: Past Surgical History: Procedure Laterality Date . IR FLUORO GUIDE CV LINE RIGHT  10/19/2020 . IR US GUIDE VASC ACCESS RIGHT  10/19/2020 . LAPAROTOMY N/A 09/01/2020  Procedure: EXPLORATORY LAPAROTOMY; Repair of Perforated Duodenal Ulcer;  Surgeon: Jesusita Oka, MD;  Location: [facility];  Service: General;  Laterality: N/A; . LYSIS OF ADHESION N/A 09/01/2020  Procedure: LYSIS OF ADHESION;  Surgeon: Jesusita Oka, MD;  Location: [place] [facility];  Service: General;  Laterality: N/A; HPI: 70 year old female who was previously diagnosed with Covid 08/23/2020.  Admitted 11/2 and intubated with AF-RVR, found to have a perforated duodenal underwent exploratory laparotomy with Phillip Heal patch placement 11/3, Intubated for 20 days until 11/23 then trach placed. Prolonged ventilation, until 1/17. Profoundly deconditioned.  No data recorded Assessment / Plan / Recommendation CHL IP CLINICAL IMPRESSIONS 11/18/2020 Clinical Impression Pt demonstrates moderate oral dysphagia with lingual pumping and prolonged oral hold. Verbal encouragement needed at times to elicit swallow. Duaghter reports pt did some oral holding prior to admit. Swallow response also delayed with all boluses collecting in pyriforms prior to the swallow. Pooling of secretions also observed. THere was one instance of sensed trace aspiration with thin before/during the swallow. This improved with small cup sips. Pts pharyngeal strength is WNL. Recommend initiating a conservative diet of dys 2/nectar and advancing as pt becomes more accustomed to oral intake. Warned daughter that appetite and interest in  PO may be limited initially. Will f/u for tolerance and advancement. SLP Visit Diagnosis Dysphagia, unspecified (R13.10) Attention and concentration deficit following -- Frontal lobe and executive function deficit following -- Impact on safety and function Risk for inadequate  nutrition/hydration;Mild aspiration risk   CHL IP TREATMENT RECOMMENDATION 11/18/2020 Treatment Recommendations Therapy as outlined in treatment plan below   Prognosis 11/18/2020 Prognosis for Safe Diet Advancement Good Barriers to Reach Goals -- Barriers/Prognosis Comment -- CHL IP DIET RECOMMENDATION 11/18/2020 SLP Diet Recommendations Dysphagia 2 (Fine chop) solids;Nectar thick liquid Liquid Administration via Cup;Straw Medication Administration Crushed with puree Compensations Slow rate;Small sips/bites Postural Changes Seated upright at 90 degrees;Remain semi-upright after after feeds/meals (Comment)   CHL IP OTHER RECOMMENDATIONS 11/18/2020 Recommended Consults -- Oral Care Recommendations Oral care BID Other Recommendations Place PMSV during PO intake;Have oral suction available;Order thickener from pharmacy   CHL IP FOLLOW UP RECOMMENDATIONS 11/18/2020 Follow up Recommendations 24 hour supervision/assistance;Home health SLP   CHL IP FREQUENCY AND DURATION 11/18/2020 Speech Therapy Frequency (ACUTE ONLY) min 2x/week Treatment Duration 2 weeks      CHL IP ORAL PHASE 11/18/2020 Oral Phase Impaired Oral - Pudding Teaspoon -- Oral - Pudding Cup -- Oral - Honey Teaspoon -- Oral - Honey Cup -- Oral - Nectar Teaspoon Delayed oral transit;Lingual pumping;Reduced posterior propulsion;Holding of bolus Oral - Nectar Cup Delayed oral transit;Lingual pumping;Reduced posterior propulsion;Holding of bolus Oral - Nectar Straw Delayed oral transit;Lingual pumping;Reduced posterior propulsion;Holding of bolus Oral - Thin Teaspoon -- Oral - Thin Cup Delayed oral transit;Lingual pumping;Reduced posterior propulsion;Holding of bolus Oral - Thin Straw Delayed oral transit;Lingual pumping;Reduced posterior propulsion;Holding of bolus Oral - Puree Delayed oral transit;Lingual pumping;Reduced posterior propulsion Oral - Mech Soft Delayed oral transit;Lingual pumping;Reduced posterior propulsion;Holding of bolus Oral - Regular -- Oral -  Multi-Consistency -- Oral - Pill -- Oral Phase - Comment --  CHL IP PHARYNGEAL PHASE 11/18/2020 Pharyngeal Phase Impaired Pharyngeal- Pudding Teaspoon -- Pharyngeal -- Pharyngeal- Pudding Cup -- Pharyngeal -- Pharyngeal- Honey Teaspoon -- Pharyngeal -- Pharyngeal- Honey Cup -- Pharyngeal -- Pharyngeal- Nectar Teaspoon Delayed swallow initiation-pyriform sinuses Pharyngeal -- Pharyngeal- Nectar Cup Delayed swallow initiation-pyriform sinuses Pharyngeal -- Pharyngeal- Nectar Straw Delayed swallow initiation-pyriform sinuses Pharyngeal -- Pharyngeal- Thin Teaspoon -- Pharyngeal -- Pharyngeal- Thin Cup Delayed swallow initiation-pyriform sinuses Pharyngeal -- Pharyngeal- Thin Straw Delayed swallow initiation-pyriform sinuses;Penetration/Aspiration before swallow;Trace aspiration Pharyngeal Material enters airway, passes BELOW cords and not ejected out despite cough attempt by patient;Material does not enter airway Pharyngeal- Puree Delayed swallow initiation-pyriform sinuses Pharyngeal -- Pharyngeal- Mechanical Soft -- Pharyngeal -- Pharyngeal- Regular Delayed swallow initiation-pyriform sinuses Pharyngeal -- Pharyngeal- Multi-consistency -- Pharyngeal -- Pharyngeal- Pill -- Pharyngeal -- Pharyngeal Comment --  No flowsheet data found. Candace [place], MA CCC-SLP Acute Rehabilitation Services Pager [phone] Office [phone] Candace [name] 11/18/2020, 11:06 AM               Labs: BMET Recent Labs  Lab 11/13/20 0353 11/13/20 1709 11/14/20 0456 11/15/20 0543 11/16/20 0456 11/17/20 0601 11/19/20 0627  NA 136 138 139 141 142 138 144  K 3.4* 4.0 3.5 3.3* 3.4* 3.6 4.4  CL 100 103 105 106 109 104 107  CO2 23 22 20* 18* 17* 24 23  GLUCOSE 84 134* 82 109* 204* 163* 97  BUN 71* 94* 106* 139* 160* 80* 103*  CREATININE 1.20* 1.44* 1.62* 1.74* 1.78* 0.95 1.10*  CALCIUM 10.0 9.9 9.6 8.8* 8.3* 8.7* 10.0  PHOS 7.1*  --   --  9.3*  --  4.1  5.3*   CBC Recent Labs  Lab 11/13/20 0353 11/17/20 0601   WBC 11.2* 8.9  NEUTROABS 7.8*  --   HGB 8.5* 8.3*  HCT 27.3* 28.1*  MCV 89.8 90.6  PLT 363 311    Medications:    . apixaban  2.5 mg Oral BID  . chlorhexidine gluconate (MEDLINE KIT)  15 mL Mouth Rinse BID  . Chlorhexidine Gluconate Cloth  6 each Topical Daily  . cholecalciferol  2,000 Units Oral Daily  . clonazepam  0.5 mg Oral TID  . collagenase   Topical BID  . darbepoetin (ARANESP) injection - DIALYSIS  100 mcg Intravenous Q Tue-HD  . feeding supplement (NEPRO CARB STEADY)  237 mL Oral TID BM  . fentaNYL  1 patch Transdermal Q72H  . guaiFENesin  15 mL Oral Q12H  . insulin aspart  0-5 Units Subcutaneous QHS  . insulin aspart  0-9 Units Subcutaneous TID WC  . insulin aspart  2 Units Subcutaneous TID WC  . insulin glargine  5 Units Subcutaneous BID  . magnesium oxide  400 mg Oral BID  . mouth rinse  15 mL Mouth Rinse QID  . metoprolol tartrate  50 mg Oral BID  . midodrine  40 mg Oral TID WC  . multivitamin  1 tablet Oral QHS  . pantoprazole sodium  40 mg Oral BID  . QUEtiapine  100 mg Oral QHS  . QUEtiapine  50 mg Oral Daily  . sodium chloride flush  10-40 mL Intracatheter Q12H      Candace Cervi Tanna Furry, MD 11/19/2020, 7:39 AM

## 2020-11-19 NOTE — Progress Notes (Signed)
Calorie Count Note  Instructed RN to hang calorie count envelope on the patient's door, document percent consumed for each item on the patient's meal tray ticket, and keep in envelope. Nursing to also document percent of any supplement or snack pt consumes and keep documentation in envelope.   RD to review and calculate on Monday 1/24.   Mariana Single RD, LDN Clinical Nutrition Pager listed in [place]

## 2020-11-19 NOTE — Progress Notes (Addendum)
SLP Cancellation Note  Patient Details Name: Crosby Bevan MRN: [id number] DOB: [birth year]   Cancelled treatment:       Reason Eval/Treat Not Completed: Patient at procedure or test/unavailable. Pt in HD. Called daughter to check in. Ms. [place] ate well last night, seemed to enjoy her food except for the pre-thickened liquids. Discussed with daughter how other drinks can be thickened, like coffee that the pt may like. Hoping that the RN can help her with this as she is not at the hospital presently. Let daughter know that it will likely be until Monday when pt is seen again though an effort will be made over the weekend if staffing allows it. Overall pt is tolerating diet and PMSV.  Pt would benefit from a trach change to a smaller cuffless trach when possible given that she is only achieving very breathy speech past current trach.    Raad Clayson, Katherene Ponto 11/19/2020, 8:50 AM

## 2020-11-20 DIAGNOSIS — R000 Tachycardia, unspecified: Secondary | ICD-10-CM | POA: Diagnosis not present

## 2020-11-20 DIAGNOSIS — I4891 Unspecified atrial fibrillation: Secondary | ICD-10-CM | POA: Diagnosis not present

## 2020-11-20 LAB — BASIC METABOLIC PANEL
Anion gap: 13 (ref 5–15)
BUN: 46 mg/dL — ABNORMAL HIGH (ref 8–23)
CO2: 22 mmol/L (ref 22–32)
Calcium: 9.8 mg/dL (ref 8.9–10.3)
Chloride: 96 mmol/L — ABNORMAL LOW (ref 98–111)
Creatinine, Ser: 1.19 mg/dL — ABNORMAL HIGH (ref 0.44–1.00)
GFR, Estimated: 49 mL/min — ABNORMAL LOW (ref >60)
Glucose, Bld: 78 mg/dL (ref 70–99)
Potassium: 4 mmol/L (ref 3.5–5.1)
Sodium: 131 mmol/L — ABNORMAL LOW (ref 135–145)

## 2020-11-20 LAB — MAGNESIUM: Magnesium: 2.9 mg/dL — ABNORMAL HIGH (ref 1.7–2.4)

## 2020-11-20 LAB — GLUCOSE, CAPILLARY
Glucose-Capillary: 114 mg/dL — ABNORMAL HIGH (ref 70–99)
Glucose-Capillary: 146 mg/dL — ABNORMAL HIGH (ref 70–99)
Glucose-Capillary: 150 mg/dL — ABNORMAL HIGH (ref 70–99)
Glucose-Capillary: 74 mg/dL (ref 70–99)
Glucose-Capillary: 78 mg/dL (ref 70–99)
Glucose-Capillary: 98 mg/dL (ref 70–99)
Glucose-Capillary: 98 mg/dL (ref 70–99)

## 2020-11-20 LAB — T3: T3, Total: 95 ng/dL (ref 71–180)

## 2020-11-20 MED ORDER — METOPROLOL TARTRATE 25 MG/10 ML ORAL SUSPENSION
50.0000 mg | Freq: Two times a day (BID) | ORAL | Status: DC
  Administered 2020-11-20 – 2020-11-21 (×2): 50 mg via ORAL
  Filled 2020-11-20 (×2): qty 20

## 2020-11-20 MED ORDER — FREE WATER
30.0000 mL | Status: DC
  Administered 2020-11-20 – 2020-11-21 (×7): 30 mL

## 2020-11-20 MED ORDER — NEPRO/CARBSTEADY PO LIQD
700.0000 mL | ORAL | Status: AC
  Administered 2020-11-20: 700 mL
  Filled 2020-11-20: qty 711

## 2020-11-20 MED ORDER — NEPRO/CARBSTEADY PO LIQD
980.0000 mL | ORAL | Status: DC
  Filled 2020-11-20 (×2): qty 1000

## 2020-11-20 MED ORDER — NEPRO/CARBSTEADY PO LIQD: 840.0000 mL | ORAL | Status: AC

## 2020-11-20 MED ORDER — JUVEN PO PACK
1.0000 | PACK | Freq: Two times a day (BID) | ORAL | Status: DC
  Administered 2020-11-21 – 2020-11-23 (×5): 1
  Filled 2020-11-20 (×5): qty 1

## 2020-11-20 MED ORDER — PROSOURCE TF PO LIQD
45.0000 mL | Freq: Four times a day (QID) | ORAL | Status: DC
  Administered 2020-11-20 – 2020-11-21 (×5): 45 mL
  Filled 2020-11-20 (×6): qty 45

## 2020-11-20 NOTE — Progress Notes (Signed)
Physical Therapy Wound Treatment Patient Details  Name: Candace [name] MRN: [id number] Date of Birth: [birth year]  Today's Date: 11/20/2020 Time: 8616-8372 Time Calculation (min): 25 min  Subjective  Subjective: Pt awake and agreeable to wound care.  PO given 30 min prior to tx but patient was in more pain this session which limited debridement. Patient and Family Stated Goals: heal wound Date of Onset:  (unknown) Prior Treatments: prior hydro and Dakins  Pain Score:  Faces 6/10 - sacrum- grimmacing/discomfort.   Wound Assessment  Pressure Injury 09/25/20 Sacrum Bilateral;Medial Deep Tissue Pressure Injury - Purple or maroon localized area of discolored intact skin or blood-filled blister due to damage of underlying soft tissue from pressure and/or shear. Purple, non-blanchable, b (Active)  Dressing Type ABD;Barrier Film (skin prep);Normal saline moist dressing 11/20/20 1244  Dressing Clean;Dry;Intact 11/19/20 1623  Dressing Change Frequency Daily 11/20/20 1244  State of Healing Eschar 11/20/20 1244  Site / Wound Assessment Brown;Yellow;Pink 11/20/20 1244  % Wound base Red or Granulating 10% 11/20/20 1244  % Wound base Yellow/Fibrinous Exudate 80% 11/20/20 1244  % Wound base Black/Eschar 10% 11/20/20 1244  % Wound base Other/Granulation Tissue (Comment) 0% 11/20/20 1244  Peri-wound Assessment Intact 11/20/20 1244  Wound Length (cm) 14.2 cm 11/18/20 1500  Wound Width (cm) 12 cm 11/18/20 1500  Wound Depth (cm) 5.4 cm 11/18/20 1500  Wound Surface Area (cm^2) 170.4 cm^2 11/18/20 1500  Wound Volume (cm^3) 920.16 cm^3 11/18/20 1500  Tunneling (cm) 0 11/19/20 1623  Undermining (cm) 0 11/19/20 1623  Margins Unattached edges (unapproximated) 11/20/20 1244  Drainage Amount Minimal 11/20/20 1244  Drainage Description Serous 11/20/20 1244  Treatment Debridement (Selective);Hydrotherapy (Pulse lavage);Other (Comment);Packing (Saline gauze) 11/20/20 1244     Santyl applied to wound bed prior to  applying dressing.  Hydrotherapy Pulsed lavage therapy - wound location: sacrum Pulsed Lavage with Suction (psi): 12 psi Pulsed Lavage with Suction - Normal Saline Used: 1000 mL Pulsed Lavage Tip: Tip with splash shield Selective Debridement Selective Debridement - Location: sacrum Selective Debridement - Tools Used: Forceps;Scalpel Selective Debridement - Tissue Removed: necrotic adipose, eschar, and slough at base   Wound Assessment and Plan  Wound Therapy - Assess/Plan/Recommendations Wound Therapy - Clinical Statement: Pt in more pain this session with PO meds 30 mins prior.  May need to give full hour before starting hydro. Pt continues to have unstagable pressure ulcer on injury with 90% eschar/slough.  Pt will continue to benefit from continued hydrotherapy and for selective debridement of unviable tissue in order to decrease bioburden and promote wound bed healing. Wound Therapy - Functional Problem List: Global weakness and immobility Factors Delaying/Impairing Wound Healing: Diabetes Mellitus;Immobility;Multiple medical problems;Other (comment) (poor nutrition) Hydrotherapy Plan: Debridement;Pulsatile lavage with suction;Patient/family education;Dressing change Wound Therapy - Frequency: 6X / week Wound Therapy - Follow Up Recommendations: Skilled nursing facility Wound Plan: see above  Wound Therapy Goals- Improve the function of patient's integumentary system by progressing the wound(s) through the phases of wound healing (inflammation - proliferation - remodeling) by: Decrease Necrotic Tissue to: 50% Decrease Necrotic Tissue - Progress: Progressing toward goal Increase Granulation Tissue to: 50% Increase Granulation Tissue - Progress: Progressing toward goal Goals/treatment plan/discharge plan were made with and agreed upon by patient/family: Yes Time For Goal Achievement: 7 days Wound Therapy - Potential for Goals: Fair  Goals will be updated until maximal potential  achieved or discharge criteria met.  Discharge criteria: when goals achieved, discharge from hospital, MD decision/surgical intervention, no progress towards goals, refusal/missing three consecutive treatments  without notification or medical reason.  GP     Candace [name] Candace [name] 11/20/2020, 12:53 PM Candace [name] , PTA Acute Rehabilitation Services Pager [phone] Office [phone]

## 2020-11-20 NOTE — Progress Notes (Addendum)
Nutrition Follow-up  DOCUMENTATION CODES:   Not applicable  INTERVENTION:   Initiate nocturnal feeds of Nepro continuous @70ml /hr x 14 hrs (1800-0800) + Pro-Source TF 53ml QID via Cortrak tube  Free water flushes 57ml q4 hours to maintain tube patency   Regimen provides 1924kcal/day, 123g/day protein and 822ml/day free water   Pt at high refeed risk; recommend monitor potassium, magnesium and phosphorus labs daily until stable  Continue Rena-vit daily   Juven Fruit Punch BID via tube, each serving provides 95kcal and 2.5g of protein (amino acids glutamine and arginine)  NUTRITION DIAGNOSIS:   Increased nutrient needs related to wound healing as evidenced by estimated needs. Ongoing   GOAL:   Patient will meet greater than or equal to 90% of their needs -Progressing with initiation of tube feeds  MONITOR:   PO intake,Supplement acceptance,Labs,Weight trends,TF tolerance,Skin,I & O's  ASSESSMENT:   70 year old female patient with h/o diabetes, stage III chronic kidney disease, atrial fibrillation, hypertension, dyslipidemia and colon cancer s/p colectomy and colostomy who was admitted with COVID on 08/23/20 along with acute kidney injury and hypoxemic respiratory failure.  Pt was discharged home on 2 to 3 L of oxygen but returned to the ER on 08/31/2020 with atrial fibrillation with RVR and hemorrhagic shock r/t perforated duodenal ulcer now s/p exploratory laparotomy on 11/3 with adhesiolysis and modified graham patch.   Pt continues on intermittent HD; next scheduled for 1/24  Received consult from MD to start nocturnal tube feeds to help pt meet her estimated needs and allow for patient to have a chance to eat during the day. Pt is currently documented to be eating ~10% of meals; calorie count is in progress. Will start patient at the lower end of her estimated needs to encourage increased oral intake; tube feeds will be adjusted depending on patient's oral intake. Will add  Juven to support wound healing.    Per chart, pt's weights have fluctuated since admit but appear to be fairly stable.   Medications reviewed and include: D3, insulin, rena-vit, protonix  Labs reviewed: Na 131(L), BUN 46(H), creat 1.19(H), Mg 2.9(H) P 5.3(H)- 1/21 Hgb 8.6(L), Hct 29.8(L) cbgs- 78, 114, 74 x 24 hrs  Diet Order:   Diet Order            DIET DYS 2 Room service appropriate? No; Fluid consistency: Nectar Thick  Diet effective now                EDUCATION NEEDS:   Not appropriate for education at this time  Skin:  Skin Assessment: Skin Integrity Issues: Skin Integrity Issues:: Stage II DTI: sacrum Stage I: n/a Stage II: ear Incisions: abdomen  Last BM:  1/21- type 7  Height:   Ht Readings from Last 1 Encounters:  09/20/20 5\' 8"  (1.727 m)    Weight:   Wt Readings from Last 1 Encounters:  08/23/20 99.8 kg    BMI:  Body mass index is 31.04 kg/m.  Estimated Nutritional Needs:   Kcal:  2000-2300kcal/day  Protein:  100-120g/day  Fluid:  UOP +1L  Koleen Distance MS, RD, LDN Please refer to [facility] for RD and/or RD on-call/weekend/after hours pager

## 2020-11-20 NOTE — Progress Notes (Signed)
PROGRESS NOTE    Candace [name]  TGY:[id number] DOB: 01/30/1951 DOA: 08/31/2020 PCP: Algis Greenhouse, MD    Brief Narrative:  Receiving this patient after 77 days in intensive care unit, most of the events are as per hospital records and previous providers documentation.  Patient does have history of colon cancer status post colon resection with colostomy bag, hyperlipidemia, type 2 diabetes, stage III chronic kidney disease.  She was admitted to the hospital and treated for COVID-19 pneumonia and was discharged back home.  Sent to the ER by her home care nurse due to elevated heart rate. 70 year old female who was recently diagnosed with COVID on 08/23/2020 came back to the ER with not feeling well, she was admitted on 08/31/2020 with A. fib with RVR, hemorrhagic shock and found to have perforated duodenal ulcer for that she underwent exploratory laparotomy on 11/3.  She has extensive ICU stay with significant events as below.  11/2 Admitted with rapid Afib 11/3 OR with findings of perforated duodenal ulcer 11/10 progressive hemorrhagic shock, intubated, transfused, pressors, proned; started on CRRT in PM 11/16 Extubated. Re-intubated overnight due to respiratory distress and hypoxia with decreased mentation 11/18 Bronch, cultures sent 11/19 Hgb down getting blood 11/24 Spiked fever, resume empirical antimicrobial therapy 11/26 Hemorrhagic shock, hgb 5.6, increased pressors,  11/30 Per palliative "[name] understanding that patient is unfortunately very fragiledespite ongoing intensive medical treatment and full mechanical support. [name] that the familywantsto continue with all current interventions despite potential outcomes". 12/08 CRRT discontinued due to clotting 12/09 Family requested transfer to tertiary care [facility]). [facility] denied transfer  12/10 CRRT restarted. Episodes of tachycardia, tachypnea that seem to improve with pain management 12/11 Back in shock. Pressor  requirements up. CXR worse. ABX resumed 12/12 Still requiring inc pressors. Had hypoglycemic event. Basal insulin dosing adjusted 12/13 Pressor requirements better. Now hyperglycemic. Re-adjusted Glycemic control  12/19 Afebrile . Remains on dilaudid and heparin gtt, dilaudid gtt increased overnight for concern of pain / ongoing tachycardia, no other events . NE and precedex off 12/17. Ongoing CRRT- even UF, dosing lokelmia this morning 12/20 On CRRT.  Renal plans for HD cath and moving to HD. Getting wound care 12/24 - renal stopping CRRT today and plans HD 10/24/20 . 40% fio2 on vent via Trach. TAchypenic and tachycardic. Afebrile but wbc up to 27.6K. On TF. On heparin gtt 12/25 - Back on CRRT. On vent via trach at 40% fio2, On scheduled dilaudid as add on to oxy. Per family request 12/24 - they felt scheduled oxy was not adequate and patient was showing signs of opioid withdrawal.  Patient also had  worsening SIRS/sepsis syndrome. Had fever, rising wbc, worsening RR 40-60 and HR 140s sinus-> started On abx yesterday. Fever some better today. WBC plateau at 28,.5K. On new levophed -> signifanct improvement in HR 77 and RR t0 20. On heparin gtt.  On precedex gtt. On levophed gtt 87mg wthi midodrine. On TF 12/30 Remains on CRRT with intermittent pressor requirements. Ethics consult placed 12/29. Ethics rec time trial of CRRT 12/31 failed SBT with RR 40s. Several conversations between care tam and family, who are upset RE plan of care 1/1 back on pressors  1/4: On pressors, keeping even on CVVHD, HGB drop to 6.9, transfused 1 unit. Improving mental status 1/10 remains on low dose levophed 272m 1/11 off levo, attempting HD with UF for first time. Now tolerating intermittent HD. 1/19 patient transferred to progressive bed with tracheostomy on 35% FiO2.  has NG tube  feeding.  No PEG tube.  Received dialysis on 1/18 and 1/21  Assessment & Plan:   Principal Problem:   Atrial fibrillation with RVR  (HCC) Active Problems:   Acute respiratory failure due to COVID-19 [facility]   New onset a-fib (HCC)   Leukocytosis   Atrial fibrillation with rapid ventricular response (HCC)   Hypoxia   Pressure injury of skin   Acute hypoxemic respiratory failure (HCC)   ARDS (adult respiratory distress syndrome) (HCC)   Perforated duodenal ulcer ([place])   On mechanically assisted ventilation ([place])   Palliative care by specialist   Goals of care, counseling/discussion   Shock ([place])   Acute and chronic respiratory failure (acute-on-chronic) ([place])   Status post tracheostomy ([place])   ESRD (end stage renal disease) ([place])  Paroxysmal A. fib with RVR: Now with sinus tachycardia.  Treated with as needed metoprolol IV.  Now her heart rate is consistently elevated. Therapeutic on Eliquis.  Sinus rhythm.   Started on oral metoprolol.  Seen by cardiology.  Recommended metoprolol with holding parameters.  No new recommendations.  Acute respiratory failure due to COVID-19 pneumonia: Status post tracheostomy.  Multiple bacterial pneumonias.  VDRF: Completed all antibiotic therapy. Currently on trach collar with 28% FiO2.   Respiratory therapy and PCCM following.  Aggressive suctioning and chest physiotherapy as much possible.  Wean as possible. Patient was able to tolerate Passy-Muir valve.  She passed modified barium swallow evaluation today and able to start on dysphagia 2 diet with thin liquids, however no liquid intake.  Acute metabolic encephalopathy: Improved as per patient's daughter at the bedside.  Patient is able to follow commands and have conversation.   Extremely debilitated.  Generalized weakness but no focal neurological deficit.  hemorrhagic shock: Resolved now.  Blood pressures are adequate.  Hemoglobin is adequate.  AKI due to sepsis: Currently on intermittent hemodialysis.  Remains on midodrine for blood pressure support. Family interested on dialysis at home, social worker and nephrology  following. Receiving intermittent dialysis.  Last dialysis on 1/21.  A. Fib/DVT of lower extremity on anticoagulation: Continue Eliquis.  Tolerating.   Anemia of critical illness: Multiple transfusions.  Total 12 units of PRBC.  Last PRBC given on 11/11/2020 and has remained stable.  We will continue to closely monitor.  Critical illness myopathy/severe physical deconditioning/inadequate oral intake: Dobbhoff tube feeding present for more than 2 months. He started on dysphagia diet, however patient is not eating and and is hypoglycemic despite discontinuing insulin doses. Continue oral diet with supervision, Patient will not meet enough nutritional intake with current diet, start nocturnal tube feeding.  Type 2 diabetes with hyperglycemia: Episodes of hypoglycemia after stopping tube feedings. Continue to use sliding scale insulin.  Discontinue long-acting insulins.  Stage IV sacral decubitus ulcer: Not present on admission.  Patient has extensive sacral decubitus ulcer.  Followed by wound care. Seen by surgery.  Recommended against general anesthesia and surgical procedure.  Local wound care instructions present. Currently receiving hydrotherapy.  History of stage IV colon cancer: She has a colostomy.  Outpatient follow-up.  Pain management: Fentanyl patch, Dilaudid as needed rarely using. Seroquel 50 in the morning and 100 in the evening.  Currently optimal pain management. Patient is on Klonopin that we will continue.      DVT prophylaxis: apixaban (ELIQUIS) tablet 2.5 mg Start: 11/18/20 2200 Place and maintain sequential compression device Start: 09/07/20 1349 Place TED hose Start: 08/31/20 1711 apixaban (ELIQUIS) tablet 2.5 mg   Code Status: Full code Family Communication: Patient's  daughter at the bedside. Disposition Plan: Status is: Inpatient  Remains inpatient appropriate because:Unsafe d/c plan and Inpatient level of care appropriate due to severity of  illness   Dispo: The patient is from: Home              Anticipated d/c is to: Home with home health              Anticipated d/c date is: > 3 days              Patient currently is not medically stable to d/c.  Consultants:   Cardiology  Surgery  Nephrology  Ethics  PCCM  Procedures:  R PICC 11/5 >> A line 11/9 >> out ETT 11/9 > 11/16, 11/16 >> 09/21/2020, 09/21/2020 tracheostomy>> Lt [place] CVL 11/9 >> R IJ trialysis >> out HD catheter 12/1 >>12/20 12/21 - 14.5 Fr, 23 cm right IJ tunneled hemodialysis catheter placement. Removal of indwelling subclavian catheter.   Antimicrobials:  11/10 MRSA PCR > neg 11/10 BC x 2 > neg 09/22/2020 blood cultures x2>> S epi.  09/22/2020 sputum culture>> MRSA Blood 12/1 >> negative. S.epi 1 out of 2, likely contaminant BCx 2 12/12 >> neg xxx Trach aspirate 12/24 - FEW STAPHYLOCOCCUS AUREUS  Blood 12/24 > Negative  Blood 1/2: neg Sputum 1/4: MSSA  Antibiotics  azithro 11/2 >11/3 Ceftriaxone 11/2  Fluconazole 11/3 Zosyn 11/3 >> 11/7 Vanc 11/10 off Cefepime 11/10 > 11/16 09/22/2020 vancomycin for MRSA PNA >> 12/2 xxxx Vanc 12/11> 12/17 Zosyn 12/11> 12/17 xxxx vanc 12/24 (staph resp cutlure) >> 12/28 Zosyn 12/24 - 12/27 Cefazolin 12/27 >>1/1 linezolid 1/2 eraxis 1/2  Meropenem 1/2 > 1/8   Subjective: Patient seen and examined.  She was able to speak with weak voice.  Currently on 28% FiO2. Patient is trying to speak, she states she has not appetite. Nursing reported that she has not eaten even 25% of her meal. Blood sugars less than 70 after stopping tube feeding. Received hemodialysis 1/21.  Objective: Vitals:   11/20/20 0739 11/20/20 0743 11/20/20 0846 11/20/20 1100  BP: 119/75  120/77   Pulse: (!) 115 (!) 117 (!) 118 93  Resp: (!) 24 (!) 29  (!) 23  Temp: 99 F (37.2 C)     TempSrc: Axillary     SpO2: 100% 100%  100%  Weight:      Height:        Intake/Output Summary (Last 24 hours) at 11/20/2020  1148 Last data filed at 11/19/2020 2310 Gross per 24 hour  Intake 696.17 ml  Output -  Net 696.17 ml   Filed Weights   11/16/20 1500 11/16/20 1802 11/16/20 2120  Weight: 86 kg 85 kg 92.6 kg    Examination:  General exam: Sick and frail looking. Not in any distress at rest, frail and debilitated. Respiratory system: Conducted upper airway sounds.  Otherwise bilateral clear.  Today she is on 28% FiO2.  Tracheostomy is clear. Cardiovascular system: S1 & S2 heard, RRR.  Tachycardic.   Gastrointestinal system: Abdomen is nondistended, soft.  No localized tenderness.  She has a right lower quadrant colostomy bag with brown liquidy stool.  Midline incision with granulation tissue, no evidence of infection.. Central nervous system: Alert and awake.  Follows commands.  Looks appropriate.   Extremities: Extremely debilitated.  3/5 all extremities. Skin: Stage IV sacral decubitus ulcer with undetermined base, pictures in the chart.    Data Reviewed: I have personally reviewed following labs and imaging studies  CBC: Recent  Labs  Lab 11/17/20 0601 11/19/20 1100  WBC 8.9 10.4  HGB 8.3* 8.6*  HCT 28.1* 29.8*  MCV 90.6 91.4  PLT 311 157   Basic Metabolic Panel: Recent Labs  Lab 11/15/20 0543 11/16/20 0456 11/17/20 0601 11/19/20 0627 11/20/20 0629  NA 141 142 138 144 131*  K 3.3* 3.4* 3.6 4.4 4.0  CL 106 109 104 107 96*  CO2 18* 17* 24 23 22   GLUCOSE 109* 204* 163* 97 78  BUN 139* 160* 80* 103* 46*  CREATININE 1.74* 1.78* 0.95 1.10* 1.19*  CALCIUM 8.8* 8.3* 8.7* 10.0 9.8  MG 1.8 1.8 1.5* 1.5* 2.9*  PHOS 9.3*  --  4.1 5.3*  --    GFR: Estimated Creatinine Clearance: 53.1 mL/min (A) (by C-G formula based on SCr of 1.19 mg/dL (H)). Liver Function Tests: No results for input(s): AST, ALT, ALKPHOS, BILITOT, PROT, ALBUMIN in the last 168 hours. No results for input(s): LIPASE, AMYLASE in the last 168 hours. No results for input(s): AMMONIA in the last 168 hours. Coagulation  Profile: No results for input(s): INR, PROTIME in the last 168 hours. Cardiac Enzymes: No results for input(s): CKTOTAL, CKMB, CKMBINDEX, TROPONINI in the last 168 hours. BNP (last 3 results) No results for input(s): PROBNP in the last 8760 hours. HbA1C: No results for input(s): HGBA1C in the last 72 hours. CBG: Recent Labs  Lab 11/19/20 1610 11/19/20 2018 11/20/20 0016 11/20/20 0321 11/20/20 0736  GLUCAP 107* 91 78 114* 74   Lipid Profile: No results for input(s): CHOL, HDL, LDLCALC, TRIG, CHOLHDL, LDLDIRECT in the last 72 hours. Thyroid Function Tests: Recent Labs    11/19/20 1630  TSH 2.253  FREET4 0.94   Anemia Panel: No results for input(s): VITAMINB12, FOLATE, FERRITIN, TIBC, IRON, RETICCTPCT in the last 72 hours. Sepsis Labs: No results for input(s): PROCALCITON, LATICACIDVEN in the last 168 hours.  No results found for this or any previous visit (from the past 240 hour(s)).       Radiology Studies: ECHOCARDIOGRAM COMPLETE  Result Date: 11/19/2020    ECHOCARDIOGRAM REPORT   Patient Name:   LAKERIA STARKMAN Date of Exam: 11/19/2020 Medical Rec #:  [id number]    Height:       68.0 in Accession #:    [id number]   Weight:       204.1 lb Date of Birth:  1951-09-16    BSA:          2.062 m Patient Age:    70 years     BP:           120/78 mmHg Patient Gender: F            HR:           107 bpm. Exam Location:  Inpatient Procedure: 2D Echo, Color Doppler, Cardiac Doppler and Intracardiac            Opacification Agent Indications:    I48.91* Unspeicified atrial fibrillation  History:        Patient has prior history of Echocardiogram examinations, most                 recent 09/01/2020. Risk Factors:Hypertension, Dyslipidemia and                 Diabetes.  Sonographer:    Bernadene Person RDCS Referring Phys: [place]  1. Mild hypokinesis of the inferior, inferoseptal, and anteroseptal myocardium. Left ventricular ejection fraction, by estimation, is 55 to 60%.  The  left ventricle has normal function. The left ventricle demonstrates regional wall motion abnormalities (see scoring diagram/findings for description). Left ventricular diastolic parameters are consistent with Grade I diastolic dysfunction (impaired relaxation).  2. Right ventricular systolic function is normal. The right ventricular size is normal. There is normal pulmonary artery systolic pressure.  3. The mitral valve is normal in structure. Trivial mitral valve regurgitation. No evidence of mitral stenosis.  4. The aortic valve is tricuspid. Aortic valve regurgitation is trivial. No aortic stenosis is present.  5. Pulmonic valve regurgitation is moderate.  6. The inferior vena cava is normal in size with greater than 50% respiratory variability, suggesting right atrial pressure of 3 mmHg. FINDINGS  Left Ventricle: Mild hypokinesis of the inferior, inferoseptal, and anteroseptal myocardium. Left ventricular ejection fraction, by estimation, is 55 to 60%. The left ventricle has normal function. The left ventricle demonstrates regional wall motion abnormalities. Definity contrast agent was given IV to delineate the left ventricular endocardial borders. The left ventricular internal cavity size was normal in size. There is no left ventricular hypertrophy. Left ventricular diastolic parameters are consistent with Grade I diastolic dysfunction (impaired relaxation). Right Ventricle: The right ventricular size is normal. No increase in right ventricular wall thickness. Right ventricular systolic function is normal. There is normal pulmonary artery systolic pressure. The tricuspid regurgitant velocity is 1.57 m/s, and  with an assumed right atrial pressure of 3 mmHg, the estimated right ventricular systolic pressure is 77.1 mmHg. Left Atrium: Left atrial size was normal in size. Right Atrium: Right atrial size was normal in size. Pericardium: There is no evidence of pericardial effusion. Mitral Valve: The mitral  valve is normal in structure. Trivial mitral valve regurgitation. No evidence of mitral valve stenosis. Tricuspid Valve: The tricuspid valve is normal in structure. Tricuspid valve regurgitation is trivial. No evidence of tricuspid stenosis. Aortic Valve: The aortic valve is tricuspid. Aortic valve regurgitation is trivial. No aortic stenosis is present. Pulmonic Valve: The pulmonic valve was normal in structure. Pulmonic valve regurgitation is moderate. No evidence of pulmonic stenosis. Aorta: The aortic root is normal in size and structure. Venous: The inferior vena cava is normal in size with greater than 50% respiratory variability, suggesting right atrial pressure of 3 mmHg. IAS/Shunts: No atrial level shunt detected by color flow Doppler. Additional Comments: Compared with the echo 16/5790, systolic function is slightly worse.  LEFT VENTRICLE PLAX 2D LVIDd:         4.60 cm LVIDs:         2.80 cm LV PW:         0.90 cm LV IVS:        0.90 cm LVOT diam:     2.00 cm LV SV:         50 LV SV Index:   24 LVOT Area:     3.14 cm  RIGHT VENTRICLE TAPSE (M-mode): 1.7 cm LEFT ATRIUM             Index       RIGHT ATRIUM           Index LA diam:        3.00 cm 1.46 cm/m  RA Area:     13.80 cm LA Vol (A2C):   28.7 ml 13.92 ml/m RA Volume:   36.70 ml  17.80 ml/m LA Vol (A4C):   26.9 ml 13.05 ml/m LA Biplane Vol: 28.1 ml 13.63 ml/m  AORTIC VALVE LVOT Vmax:   84.87 cm/s LVOT Vmean:  57.600 cm/s  LVOT VTI:    0.160 m  AORTA Ao Root diam: 3.10 cm Ao Asc diam:  3.00 cm TRICUSPID VALVE TR Peak grad:   9.9 mmHg TR Vmax:        157.00 cm/s  SHUNTS Systemic VTI:  0.16 m Systemic Diam: 2.00 cm Skeet Latch MD Electronically signed by Skeet Latch MD Signature Date/Time: 11/19/2020/3:57:22 PM    Final         Scheduled Meds: . apixaban  2.5 mg Oral BID  . chlorhexidine gluconate (MEDLINE KIT)  15 mL Mouth Rinse BID  . Chlorhexidine Gluconate Cloth  6 each Topical Daily  . cholecalciferol  2,000 Units Oral Daily   . clonazepam  0.5 mg Oral TID  . collagenase   Topical BID  . darbepoetin (ARANESP) injection - DIALYSIS  100 mcg Intravenous Q Tue-HD  . feeding supplement (NEPRO CARB STEADY)  237 mL Oral TID BM  . feeding supplement (NEPRO CARB STEADY)  700 mL Per Tube Q24H  . [START ON 11/21/2020] feeding supplement (NEPRO CARB STEADY)  840 mL Per Tube Q24H  . [START ON 11/22/2020] feeding supplement (NEPRO CARB STEADY)  980 mL Per Tube Q24H  . feeding supplement (PROSource TF)  45 mL Per Tube QID  . fentaNYL  1 patch Transdermal Q72H  . free water  30 mL Per Tube Q4H  . guaiFENesin  15 mL Oral Q12H  . insulin aspart  0-5 Units Subcutaneous QHS  . insulin aspart  0-9 Units Subcutaneous TID WC  . mouth rinse  15 mL Mouth Rinse QID  . metoprolol tartrate  50 mg Oral BID  . midodrine  40 mg Oral TID WC  . multivitamin  1 tablet Oral QHS  . [START ON 11/21/2020] nutrition supplement (JUVEN)  1 packet Per Tube BID BM  . pantoprazole sodium  40 mg Oral BID  . QUEtiapine  100 mg Oral QHS  . QUEtiapine  50 mg Oral Daily  . sodium chloride flush  10-40 mL Intracatheter Q12H   Continuous Infusions: . sodium chloride 5 mL/hr at 11/16/20 0935  . sodium chloride    . sodium chloride    . sodium chloride    . albumin human 25 g (11/16/20 1516)     LOS: 80 days    Time spent: 34 minutes    Barb Merino, MD [facility] Pager [phone]

## 2020-11-20 NOTE — Progress Notes (Signed)
Notified Dr Sidney Ace regarding B/P 89/69, HR 98 outside of parameters, pt is due for Metoprolol 50 mg and  medication was held per response from physician.

## 2020-11-20 NOTE — Progress Notes (Signed)
RT  Note. Attempted to place pt. On Room air aerosol trach collar, pt. Sat down to 84 per RN. RN placed pt. Back on 28% ATC pt. Sat 98% at this time, VSS. RT/RN will continue to monitor.

## 2020-11-20 NOTE — Progress Notes (Signed)
Progress Note  Patient Name: Candace [name] Date of Encounter: 11/20/2020  Primary Cardiologist: No primary care provider on file.   Subjective   70 yo F with prolonged (~ 2 month) course related to hernia, COVID-19 and multi-organ failure (s/p tach, immobility, ESRD requiring midodrone).  Echo performed overnight, subtle WMAs, with persevered EF.  Tolerated HD without incident.  HR have remarkable improved over night with the lopressor.    Inpatient Medications    Scheduled Meds:  apixaban  2.5 mg Oral BID   chlorhexidine gluconate (MEDLINE KIT)  15 mL Mouth Rinse BID   Chlorhexidine Gluconate Cloth  6 each Topical Daily   cholecalciferol  2,000 Units Oral Daily   clonazepam  0.5 mg Oral TID   collagenase   Topical BID   darbepoetin (ARANESP) injection - DIALYSIS  100 mcg Intravenous Q Tue-HD   feeding supplement (NEPRO CARB STEADY)  237 mL Oral TID BM   fentaNYL  1 patch Transdermal Q72H   guaiFENesin  15 mL Oral Q12H   insulin aspart  0-5 Units Subcutaneous QHS   insulin aspart  0-9 Units Subcutaneous TID WC   mouth rinse  15 mL Mouth Rinse QID   metoprolol tartrate  50 mg Oral BID   midodrine  40 mg Oral TID WC   multivitamin  1 tablet Oral QHS   pantoprazole sodium  40 mg Oral BID   QUEtiapine  100 mg Oral QHS   QUEtiapine  50 mg Oral Daily   sodium chloride flush  10-40 mL Intracatheter Q12H   Continuous Infusions:  sodium chloride 5 mL/hr at 11/16/20 0935   sodium chloride     sodium chloride     sodium chloride     albumin human 25 g (11/16/20 1516)   PRN Meds: sodium chloride, [CANCELED] Place/Maintain arterial line **AND** sodium chloride, sodium chloride, sodium chloride, acetaminophen (TYLENOL) oral liquid 160 mg/5 mL, albumin human, albuterol, alteplase, bacitracin, docusate, heparin, lidocaine (PF), lidocaine-prilocaine, metoprolol tartrate, ondansetron (ZOFRAN) IV, oxyCODONE, pentafluoroprop-tetrafluoroeth, phenol, polyethylene glycol, Resource ThickenUp  Clear, sodium chloride, sodium chloride flush   Vital Signs    Vitals:   11/20/20 0426 11/20/20 0739 11/20/20 0743 11/20/20 0846  BP:  119/75  120/77  Pulse:  (!) 115 (!) 117 (!) 118  Resp:  (!) 24 (!) 29   Temp:  99 F (37.2 C)    TempSrc:  Axillary    SpO2: 99% 100% 100%   Weight:      Height:        Intake/Output Summary (Last 24 hours) at 11/20/2020 1006 Last data filed at 11/19/2020 2310 Gross per 24 hour  Intake 696.17 ml  Output 1500 ml  Net -803.83 ml   Filed Weights   11/16/20 1500 11/16/20 1802 11/16/20 2120  Weight: 86 kg 85 kg 92.6 kg    Telemetry    Sinus tachycardia to sinus rhythm since ~ 1300 and lopressor administration - Personally Reviewed  ECG    Sinus Tach 137 from 11/19/20 - Personally Reviewed  Physical Exam   GEN: No acute distress.   Neck: Trach site c/d/i Cardiac: Regular rhythm, no murmurs, rubs, or gallops.  Respiratory: Coarse, mechanical breath sounds GI: Soft, nontender, non-distended  Neuro:  Nonfocal  Psych: tracks movement  Labs    Chemistry Recent Labs  Lab 11/17/20 0601 11/19/20 0627 11/20/20 0629  NA 138 144 131*  K 3.6 4.4 4.0  CL 104 107 96*  CO2 _0 GLUCOSE 163* 97 78  BUN 80* 103* 46*  CREATININE 0.95 1.10* 1.19*  CALCIUM 8.7* 10.0 9.8  GFRNONAA >60 54* 49*  ANIONGAP _0 Hematology Recent Labs  Lab 11/17/20 0601 11/19/20 1100  WBC 8.9 10.4  RBC 3.10* 3.26*  HGB 8.3* 8.6*  HCT 28.1* 29.8*  MCV 90.6 91.4  MCH 26.8 26.4  MCHC 29.5* 28.9*  RDW 17.4* 17.0*  PLT 311 352    Cardiac EnzymesNo results for input(s): TROPONINI in the last 168 hours. No results for input(s): TROPIPOC in the last 168 hours.   BNPNo results for input(s): BNP, PROBNP in the last 168 hours.   DDimer No results for input(s): DDIMER in the last 168 hours.   Radiology    DG Swallowing Func-Speech Pathology  Result Date: 11/18/2020 Objective Swallowing Evaluation: Type of Study: MBS-Modified Barium Swallow  Study  Patient Details Name: Kandise Riehle MRN: [id number] Date of Birth: 1951/08/20 Today's Date: 11/18/2020 Time: SLP Start Time (ACUTE ONLY): 1000 -SLP Stop Time (ACUTE ONLY): 1037 SLP Time Calculation (min) (ACUTE ONLY): 37 min Past Medical History: Past Medical History: Diagnosis Date  Colon cancer ([place])   Diabetes mellitus ([place])   Hyperlipemia   Hypertension  Past Surgical History: Past Surgical History: Procedure Laterality Date  IR FLUORO GUIDE CV LINE RIGHT  10/19/2020  IR US GUIDE VASC ACCESS RIGHT  10/19/2020  LAPAROTOMY N/A 09/01/2020  Procedure: EXPLORATORY LAPAROTOMY; Repair of Perforated Duodenal Ulcer;  Surgeon: Jesusita Oka, MD;  Location: [facility];  Service: General;  Laterality: N/A;  LYSIS OF ADHESION N/A 09/01/2020  Procedure: LYSIS OF ADHESION;  Surgeon: Jesusita Oka, MD;  Location: [facility];  Service: General;  Laterality: N/A; HPI: 70 year old female who was previously diagnosed with Covid 08/23/2020.  Admitted 11/2 and intubated with AF-RVR, found to have a perforated duodenal underwent exploratory laparotomy with Phillip Heal patch placement 11/3, Intubated for 20 days until 11/23 then trach placed. Prolonged ventilation, until 1/17. Profoundly deconditioned.  No data recorded Assessment / Plan / Recommendation CHL IP CLINICAL IMPRESSIONS 11/18/2020 Clinical Impression Pt demonstrates moderate oral dysphagia with lingual pumping and prolonged oral hold. Verbal encouragement needed at times to elicit swallow. Duaghter reports pt did some oral holding prior to admit. Swallow response also delayed with all boluses collecting in pyriforms prior to the swallow. Pooling of secretions also observed. THere was one instance of sensed trace aspiration with thin before/during the swallow. This improved with small cup sips. Pts pharyngeal strength is WNL. Recommend initiating a conservative diet of dys 2/nectar and advancing as pt becomes more accustomed to oral intake. Warned daughter that appetite and interest  in PO may be limited initially. Will f/u for tolerance and advancement. SLP Visit Diagnosis Dysphagia, unspecified (R13.10) Attention and concentration deficit following -- Frontal lobe and executive function deficit following -- Impact on safety and function Risk for inadequate nutrition/hydration;Mild aspiration risk   CHL IP TREATMENT RECOMMENDATION 11/18/2020 Treatment Recommendations Therapy as outlined in treatment plan below   Prognosis 11/18/2020 Prognosis for Safe Diet Advancement Good Barriers to Reach Goals -- Barriers/Prognosis Comment -- CHL IP DIET RECOMMENDATION 11/18/2020 SLP Diet Recommendations Dysphagia 2 (Fine chop) solids;Nectar thick liquid Liquid Administration via Cup;Straw Medication Administration Crushed with puree Compensations Slow rate;Small sips/bites Postural Changes Seated upright at 90 degrees;Remain semi-upright after after feeds/meals (Comment)   CHL IP OTHER RECOMMENDATIONS 11/18/2020 Recommended Consults -- Oral Care Recommendations Oral care BID Other Recommendations Place PMSV during PO intake;Have oral suction available;Order thickener from pharmacy   [facility]  IP FOLLOW UP RECOMMENDATIONS 11/18/2020 Follow up Recommendations 24 hour supervision/assistance;Home health SLP   CHL IP FREQUENCY AND DURATION 11/18/2020 Speech Therapy Frequency (ACUTE ONLY) min 2x/week Treatment Duration 2 weeks      CHL IP ORAL PHASE 11/18/2020 Oral Phase Impaired Oral - Pudding Teaspoon -- Oral - Pudding Cup -- Oral - Honey Teaspoon -- Oral - Honey Cup -- Oral - Nectar Teaspoon Delayed oral transit;Lingual pumping;Reduced posterior propulsion;Holding of bolus Oral - Nectar Cup Delayed oral transit;Lingual pumping;Reduced posterior propulsion;Holding of bolus Oral - Nectar Straw Delayed oral transit;Lingual pumping;Reduced posterior propulsion;Holding of bolus Oral - Thin Teaspoon -- Oral - Thin Cup Delayed oral transit;Lingual pumping;Reduced posterior propulsion;Holding of bolus Oral - Thin Straw Delayed  oral transit;Lingual pumping;Reduced posterior propulsion;Holding of bolus Oral - Puree Delayed oral transit;Lingual pumping;Reduced posterior propulsion Oral - Mech Soft Delayed oral transit;Lingual pumping;Reduced posterior propulsion;Holding of bolus Oral - Regular -- Oral - Multi-Consistency -- Oral - Pill -- Oral Phase - Comment --  CHL IP PHARYNGEAL PHASE 11/18/2020 Pharyngeal Phase Impaired Pharyngeal- Pudding Teaspoon -- Pharyngeal -- Pharyngeal- Pudding Cup -- Pharyngeal -- Pharyngeal- Honey Teaspoon -- Pharyngeal -- Pharyngeal- Honey Cup -- Pharyngeal -- Pharyngeal- Nectar Teaspoon Delayed swallow initiation-pyriform sinuses Pharyngeal -- Pharyngeal- Nectar Cup Delayed swallow initiation-pyriform sinuses Pharyngeal -- Pharyngeal- Nectar Straw Delayed swallow initiation-pyriform sinuses Pharyngeal -- Pharyngeal- Thin Teaspoon -- Pharyngeal -- Pharyngeal- Thin Cup Delayed swallow initiation-pyriform sinuses Pharyngeal -- Pharyngeal- Thin Straw Delayed swallow initiation-pyriform sinuses;Penetration/Aspiration before swallow;Trace aspiration Pharyngeal Material enters airway, passes BELOW cords and not ejected out despite cough attempt by patient;Material does not enter airway Pharyngeal- Puree Delayed swallow initiation-pyriform sinuses Pharyngeal -- Pharyngeal- Mechanical Soft -- Pharyngeal -- Pharyngeal- Regular Delayed swallow initiation-pyriform sinuses Pharyngeal -- Pharyngeal- Multi-consistency -- Pharyngeal -- Pharyngeal- Pill -- Pharyngeal -- Pharyngeal Comment --  No flowsheet data found. Herbie Baltimore, MA CCC-SLP Acute Rehabilitation Services Pager [phone] Office [phone] [name] 11/18/2020, 11:06 AM              ECHOCARDIOGRAM COMPLETE  Result Date: 11/19/2020    ECHOCARDIOGRAM REPORT   Patient Name:   OLUWAKEMI SALSBERRY Date of Exam: 11/19/2020 Medical Rec #:  [id number]    Height:       68.0 in Accession #:    [id number]   Weight:       204.1 lb Date of Birth:  1951-04-18     BSA:          2.062 m Patient Age:    21 years     BP:           120/78 mmHg Patient Gender: F            HR:           107 bpm. Exam Location:  Inpatient Procedure: 2D Echo, Color Doppler, Cardiac Doppler and Intracardiac            Opacification Agent Indications:    I48.91* Unspeicified atrial fibrillation  History:        Patient has prior history of Echocardiogram examinations, most                 recent 09/01/2020. Risk Factors:Hypertension, Dyslipidemia and                 Diabetes.  Sonographer:    Bernadene Person RDCS Referring Phys: [place]  1. Mild hypokinesis of the inferior, inferoseptal, and anteroseptal myocardium. Left ventricular ejection fraction, by estimation, is 55 to 60%. The  left ventricle has normal function. The left ventricle demonstrates regional wall motion abnormalities (see scoring diagram/findings for description). Left ventricular diastolic parameters are consistent with Grade I diastolic dysfunction (impaired relaxation).  2. Right ventricular systolic function is normal. The right ventricular size is normal. There is normal pulmonary artery systolic pressure.  3. The mitral valve is normal in structure. Trivial mitral valve regurgitation. No evidence of mitral stenosis.  4. The aortic valve is tricuspid. Aortic valve regurgitation is trivial. No aortic stenosis is present.  5. Pulmonic valve regurgitation is moderate.  6. The inferior vena cava is normal in size with greater than 50% respiratory variability, suggesting right atrial pressure of 3 mmHg. FINDINGS  Left Ventricle: Mild hypokinesis of the inferior, inferoseptal, and anteroseptal myocardium. Left ventricular ejection fraction, by estimation, is 55 to 60%. The left ventricle has normal function. The left ventricle demonstrates regional wall motion abnormalities. Definity contrast agent was given IV to delineate the left ventricular endocardial borders. The left ventricular internal cavity size was  normal in size. There is no left ventricular hypertrophy. Left ventricular diastolic parameters are consistent with Grade I diastolic dysfunction (impaired relaxation). Right Ventricle: The right ventricular size is normal. No increase in right ventricular wall thickness. Right ventricular systolic function is normal. There is normal pulmonary artery systolic pressure. The tricuspid regurgitant velocity is 1.57 m/s, and  with an assumed right atrial pressure of 3 mmHg, the estimated right ventricular systolic pressure is 47.6 mmHg. Left Atrium: Left atrial size was normal in size. Right Atrium: Right atrial size was normal in size. Pericardium: There is no evidence of pericardial effusion. Mitral Valve: The mitral valve is normal in structure. Trivial mitral valve regurgitation. No evidence of mitral valve stenosis. Tricuspid Valve: The tricuspid valve is normal in structure. Tricuspid valve regurgitation is trivial. No evidence of tricuspid stenosis. Aortic Valve: The aortic valve is tricuspid. Aortic valve regurgitation is trivial. No aortic stenosis is present. Pulmonic Valve: The pulmonic valve was normal in structure. Pulmonic valve regurgitation is moderate. No evidence of pulmonic stenosis. Aorta: The aortic root is normal in size and structure. Venous: The inferior vena cava is normal in size with greater than 50% respiratory variability, suggesting right atrial pressure of 3 mmHg. IAS/Shunts: No atrial level shunt detected by color flow Doppler. Additional Comments: Compared with the echo [id number]/6503, systolic function is slightly worse.  LEFT VENTRICLE PLAX 2D LVIDd:         4.60 cm LVIDs:         2.80 cm LV PW:         0.90 cm LV IVS:        0.90 cm LVOT diam:     2.00 cm LV SV:         50 LV SV Index:   24 LVOT Area:     3.14 cm  RIGHT VENTRICLE TAPSE (M-mode): 1.7 cm LEFT ATRIUM             Index       RIGHT ATRIUM           Index LA diam:        3.00 cm 1.46 cm/m  RA Area:     13.80 cm LA Vol (A2C):    28.7 ml 13.92 ml/m RA Volume:   36.70 ml  17.80 ml/m LA Vol (A4C):   26.9 ml 13.05 ml/m LA Biplane Vol: 28.1 ml 13.63 ml/m  AORTIC VALVE LVOT Vmax:   84.87 cm/s LVOT Vmean:  57.600  cm/s LVOT VTI:    0.160 m  AORTA Ao Root diam: 3.10 cm Ao Asc diam:  3.00 cm TRICUSPID VALVE TR Peak grad:   9.9 mmHg TR Vmax:        157.00 cm/s  SHUNTS Systemic VTI:  0.16 m Systemic Diam: 2.00 cm Skeet Latch MD Electronically signed by Skeet Latch MD Signature Date/Time: 11/19/2020/3:57:22 PM    Final     Cardiac Studies   No new  Patient Profile     70 y.o. female with prolonged course with sinus tachycardia and subtle WMAs in the setting of multi-organ failure  Assessment & Plan    Sinus Tachycardia Postoperative Atrial Fibrillation CHADVASC of 4 Hypotension with HD on ESRD - continue eliquis 2.5 mg (11/18/20 start) - continue 50 mg BID; Sinus Tach is likely in the setting of deconditioning and further increase could cause issues with HD - discussed with nursing:  will add BP parameters to lopressor order  New WMAs on Echocardiogram with RCA Calcium on 11/01/19 CT Scan COVID PNA and sequale IV Colon Cancer Stage IV Sacral Decubitis Ulcer Severe Deconditioning - will favor medical management at this point in the setting of multiple commodities - If midodrine dependence improves may attempt to add long acting nitrates  Possible Chart Check and review 11/21/20  For questions or updates, please contact [place] Please consult [email or web address] for contact info under Cardiology/STEMI.      Signed, Werner Lean, MD  11/20/2020, 10:06 AM

## 2020-11-20 NOTE — Progress Notes (Signed)
[facility] Progress Note     Assessment:   1. HD dep't AKI.CRRT stopped 11/03/2020.Steward placed on 10/19/20. Family desired ongoing full scope of care, Now on IHD 2. SVT 3. Hyperkalemia; stable 4. H/o mixed shock: hemorrhagic and septic shock; stable 5. Anemia;  on ESA 6. Perforated duodenal ulcer s/p exlap and graham patch placement 7. 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 ARDS 2/2 COVID-19 Pneumonia 9. A.fib/ DVT 10. Severe calorie malnutrition 11. H/o stage IV colon cancer 12. Hypercalcemia- immobility, stable 13. Metabolic Acidosis, mild, trend with HD  PLAN: - HD on 1/21, tolerated well except intradialytic hypotension.  Total UF 1.5 L.  Plan for next HD on 1/24.  Subjective:    Seen and examined at bedside.  She has feeding tube.  Alert awake.  Tolerated dialysis well yesterday.  No new event.   Objective:   BP 119/75 (BP Location: Left Arm)   Pulse (!) 117   Temp 99 F (37.2 C) (Axillary)   Resp (!) 29   Ht 5' 8"  (1.727 m)   Wt 92.6 kg   SpO2 100%   BMI 31.04 kg/m   Intake/Output Summary (Last 24 hours) at 11/20/2020 8182 Last data filed at 11/19/2020 2310 Gross per 24 hour  Intake 696.17 ml  Output 1500 ml  Net -803.83 ml   Weight change:   Physical Exam: Gen: Chronically ill looking female lying on bed comfortable HEENT: trach in place CVS: tachy, S1-S2 normal Resp: unlabored, trach Abd: soft, nontender Ext:  No edema appreciated Neuro: follows commands, awake, slow to respond ACCESSRetta [name] [facility]  Imaging: DG Swallowing Func-Speech Pathology  Result Date: 11/18/2020 Objective Swallowing Evaluation: Type of Study: MBS-Modified Barium Swallow Study  Patient Details Name: Candace [name] MRN: [id number] Date of Birth: 02/11/1951 Today's Date: 11/18/2020 Time: SLP Start Time (ACUTE ONLY): 1000 -SLP Stop Time (ACUTE ONLY): 1037 SLP Time Calculation (min) (ACUTE ONLY): 37 min Past Medical History: Past Medical History: Diagnosis Date . Colon cancer  ([place])  . Diabetes mellitus ([place])  . Hyperlipemia  . Hypertension  Past Surgical History: Past Surgical History: Procedure Laterality Date . IR FLUORO GUIDE CV LINE RIGHT  10/19/2020 . IR US GUIDE VASC ACCESS RIGHT  10/19/2020 . LAPAROTOMY N/A 09/01/2020  Procedure: EXPLORATORY LAPAROTOMY; Repair of Perforated Duodenal Ulcer;  Surgeon: Jesusita Oka, MD;  Location: [place];  Service: General;  Laterality: N/A; . LYSIS OF ADHESION N/A 09/01/2020  Procedure: LYSIS OF ADHESION;  Surgeon: Jesusita Oka, MD;  Location: [place];  Service: General;  Laterality: N/A; HPI: 70 year old female who was previously diagnosed with Covid 08/23/2020.  Admitted 11/2 and intubated with AF-RVR, found to have a perforated duodenal underwent exploratory laparotomy with Candace [name] patch placement 11/3, Intubated for 20 days until 11/23 then trach placed. Prolonged ventilation, until 1/17. Profoundly deconditioned.  No data recorded Assessment / Plan / Recommendation CHL IP CLINICAL IMPRESSIONS 11/18/2020 Clinical Impression Pt demonstrates moderate oral dysphagia with lingual pumping and prolonged oral hold. Verbal encouragement needed at times to elicit swallow. Duaghter reports pt did some oral holding prior to admit. Swallow response also delayed with all boluses collecting in pyriforms prior to the swallow. Pooling of secretions also observed. THere was one instance of sensed trace aspiration with thin before/during the swallow. This improved with small cup sips. Pts pharyngeal strength is WNL. Recommend initiating a conservative diet of dys 2/nectar and advancing as pt becomes more accustomed to oral intake. Warned daughter that appetite and interest in  PO may be limited initially. Will f/u for tolerance and advancement. SLP Visit Diagnosis Dysphagia, unspecified (R13.10) Attention and concentration deficit following -- Frontal lobe and executive function deficit following -- Impact on safety and function Risk for inadequate  nutrition/hydration;Mild aspiration risk   CHL IP TREATMENT RECOMMENDATION 11/18/2020 Treatment Recommendations Therapy as outlined in treatment plan below   Prognosis 11/18/2020 Prognosis for Safe Diet Advancement Good Barriers to Reach Goals -- Barriers/Prognosis Comment -- CHL IP DIET RECOMMENDATION 11/18/2020 SLP Diet Recommendations Dysphagia 2 (Fine chop) solids;Nectar thick liquid Liquid Administration via Cup;Straw Medication Administration Crushed with puree Compensations Slow rate;Small sips/bites Postural Changes Seated upright at 90 degrees;Remain semi-upright after after feeds/meals (Comment)   CHL IP OTHER RECOMMENDATIONS 11/18/2020 Recommended Consults -- Oral Care Recommendations Oral care BID Other Recommendations Place PMSV during PO intake;Have oral suction available;Order thickener from pharmacy   CHL IP FOLLOW UP RECOMMENDATIONS 11/18/2020 Follow up Recommendations 24 hour supervision/assistance;Home health SLP   CHL IP FREQUENCY AND DURATION 11/18/2020 Speech Therapy Frequency (ACUTE ONLY) min 2x/week Treatment Duration 2 weeks      CHL IP ORAL PHASE 11/18/2020 Oral Phase Impaired Oral - Pudding Teaspoon -- Oral - Pudding Cup -- Oral - Honey Teaspoon -- Oral - Honey Cup -- Oral - Nectar Teaspoon Delayed oral transit;Lingual pumping;Reduced posterior propulsion;Holding of bolus Oral - Nectar Cup Delayed oral transit;Lingual pumping;Reduced posterior propulsion;Holding of bolus Oral - Nectar Straw Delayed oral transit;Lingual pumping;Reduced posterior propulsion;Holding of bolus Oral - Thin Teaspoon -- Oral - Thin Cup Delayed oral transit;Lingual pumping;Reduced posterior propulsion;Holding of bolus Oral - Thin Straw Delayed oral transit;Lingual pumping;Reduced posterior propulsion;Holding of bolus Oral - Puree Delayed oral transit;Lingual pumping;Reduced posterior propulsion Oral - Mech Soft Delayed oral transit;Lingual pumping;Reduced posterior propulsion;Holding of bolus Oral - Regular -- Oral -  Multi-Consistency -- Oral - Pill -- Oral Phase - Comment --  CHL IP PHARYNGEAL PHASE 11/18/2020 Pharyngeal Phase Impaired Pharyngeal- Pudding Teaspoon -- Pharyngeal -- Pharyngeal- Pudding Cup -- Pharyngeal -- Pharyngeal- Honey Teaspoon -- Pharyngeal -- Pharyngeal- Honey Cup -- Pharyngeal -- Pharyngeal- Nectar Teaspoon Delayed swallow initiation-pyriform sinuses Pharyngeal -- Pharyngeal- Nectar Cup Delayed swallow initiation-pyriform sinuses Pharyngeal -- Pharyngeal- Nectar Straw Delayed swallow initiation-pyriform sinuses Pharyngeal -- Pharyngeal- Thin Teaspoon -- Pharyngeal -- Pharyngeal- Thin Cup Delayed swallow initiation-pyriform sinuses Pharyngeal -- Pharyngeal- Thin Straw Delayed swallow initiation-pyriform sinuses;Penetration/Aspiration before swallow;Trace aspiration Pharyngeal Material enters airway, passes BELOW cords and not ejected out despite cough attempt by patient;Material does not enter airway Pharyngeal- Puree Delayed swallow initiation-pyriform sinuses Pharyngeal -- Pharyngeal- Mechanical Soft -- Pharyngeal -- Pharyngeal- Regular Delayed swallow initiation-pyriform sinuses Pharyngeal -- Pharyngeal- Multi-consistency -- Pharyngeal -- Pharyngeal- Pill -- Pharyngeal -- Pharyngeal Comment --  No flowsheet data found. Candace [place], MA CCC-SLP Acute Rehabilitation Services Pager [phone] Office [phone] Candace [name] 11/18/2020, 11:06 AM              ECHOCARDIOGRAM COMPLETE  Result Date: 11/19/2020    ECHOCARDIOGRAM REPORT   Patient Name:   Candace [name] Date of Exam: 11/19/2020 Medical Rec #:  [id number]    Height:       68.0 in Accession #:    [id number]   Weight:       204.1 lb Date of Birth:  10-20-1951    BSA:          2.062 m Patient Age:    8 years     BP:           120/78 mmHg Patient Gender: F  HR:           107 bpm. Exam Location:  Inpatient Procedure: 2D Echo, Color Doppler, Cardiac Doppler and Intracardiac            Opacification Agent Indications:    I48.91*  Unspeicified atrial fibrillation  History:        Patient has prior history of Echocardiogram examinations, most                 recent 09/01/2020. Risk Factors:Hypertension, Dyslipidemia and                 Diabetes.  Sonographer:    Bernadene Person RDCS Referring Phys: [place]  1. Mild hypokinesis of the inferior, inferoseptal, and anteroseptal myocardium. Left ventricular ejection fraction, by estimation, is 55 to 60%. The left ventricle has normal function. The left ventricle demonstrates regional wall motion abnormalities (see scoring diagram/findings for description). Left ventricular diastolic parameters are consistent with Grade I diastolic dysfunction (impaired relaxation).  2. Right ventricular systolic function is normal. The right ventricular size is normal. There is normal pulmonary artery systolic pressure.  3. The mitral valve is normal in structure. Trivial mitral valve regurgitation. No evidence of mitral stenosis.  4. The aortic valve is tricuspid. Aortic valve regurgitation is trivial. No aortic stenosis is present.  5. Pulmonic valve regurgitation is moderate.  6. The inferior vena cava is normal in size with greater than 50% respiratory variability, suggesting right atrial pressure of 3 mmHg. FINDINGS  Left Ventricle: Mild hypokinesis of the inferior, inferoseptal, and anteroseptal myocardium. Left ventricular ejection fraction, by estimation, is 55 to 60%. The left ventricle has normal function. The left ventricle demonstrates regional wall motion abnormalities. Definity contrast agent was given IV to delineate the left ventricular endocardial borders. The left ventricular internal cavity size was normal in size. There is no left ventricular hypertrophy. Left ventricular diastolic parameters are consistent with Grade I diastolic dysfunction (impaired relaxation). Right Ventricle: The right ventricular size is normal. No increase in right ventricular wall thickness. Right  ventricular systolic function is normal. There is normal pulmonary artery systolic pressure. The tricuspid regurgitant velocity is 1.57 m/s, and  with an assumed right atrial pressure of 3 mmHg, the estimated right ventricular systolic pressure is 33.0 mmHg. Left Atrium: Left atrial size was normal in size. Right Atrium: Right atrial size was normal in size. Pericardium: There is no evidence of pericardial effusion. Mitral Valve: The mitral valve is normal in structure. Trivial mitral valve regurgitation. No evidence of mitral valve stenosis. Tricuspid Valve: The tricuspid valve is normal in structure. Tricuspid valve regurgitation is trivial. No evidence of tricuspid stenosis. Aortic Valve: The aortic valve is tricuspid. Aortic valve regurgitation is trivial. No aortic stenosis is present. Pulmonic Valve: The pulmonic valve was normal in structure. Pulmonic valve regurgitation is moderate. No evidence of pulmonic stenosis. Aorta: The aortic root is normal in size and structure. Venous: The inferior vena cava is normal in size with greater than 50% respiratory variability, suggesting right atrial pressure of 3 mmHg. IAS/Shunts: No atrial level shunt detected by color flow Doppler. Additional Comments: Compared with the echo 04/6225, systolic function is slightly worse.  LEFT VENTRICLE PLAX 2D LVIDd:         4.60 cm LVIDs:         2.80 cm LV PW:         0.90 cm LV IVS:        0.90 cm LVOT diam:  2.00 cm LV SV:         50 LV SV Index:   24 LVOT Area:     3.14 cm  RIGHT VENTRICLE TAPSE (M-mode): 1.7 cm LEFT ATRIUM             Index       RIGHT ATRIUM           Index LA diam:        3.00 cm 1.46 cm/m  RA Area:     13.80 cm LA Vol (A2C):   28.7 ml 13.92 ml/m RA Volume:   36.70 ml  17.80 ml/m LA Vol (A4C):   26.9 ml 13.05 ml/m LA Biplane Vol: 28.1 ml 13.63 ml/m  AORTIC VALVE LVOT Vmax:   84.87 cm/s LVOT Vmean:  57.600 cm/s LVOT VTI:    0.160 m  AORTA Ao Root diam: 3.10 cm Ao Asc diam:  3.00 cm TRICUSPID VALVE TR  Peak grad:   9.9 mmHg TR Vmax:        157.00 cm/s  SHUNTS Systemic VTI:  0.16 m Systemic Diam: 2.00 cm Skeet Latch MD Electronically signed by Skeet Latch MD Signature Date/Time: 11/19/2020/3:57:22 PM    Final     Labs: BMET Recent Labs  Lab 11/13/20 1709 11/14/20 8882 11/15/20 0543 11/16/20 0456 11/17/20 0601 11/19/20 0627 11/20/20 0629  NA 138 139 141 142 138 144 131*  K 4.0 3.5 3.3* 3.4* 3.6 4.4 4.0  CL 103 105 106 109 104 107 96*  CO2 22 20* 18* 17* 24 23 22   GLUCOSE 134* 82 109* 204* 163* 97 78  BUN 94* 106* 139* 160* 80* 103* 46*  CREATININE 1.44* 1.62* 1.74* 1.78* 0.95 1.10* 1.19*  CALCIUM 9.9 9.6 8.8* 8.3* 8.7* 10.0 9.8  PHOS  --   --  9.3*  --  4.1 5.3*  --    CBC Recent Labs  Lab 11/17/20 0601 11/19/20 1100  WBC 8.9 10.4  HGB 8.3* 8.6*  HCT 28.1* 29.8*  MCV 90.6 91.4  PLT 311 352    Medications:    . apixaban  2.5 mg Oral BID  . chlorhexidine gluconate (MEDLINE KIT)  15 mL Mouth Rinse BID  . Chlorhexidine Gluconate Cloth  6 each Topical Daily  . cholecalciferol  2,000 Units Oral Daily  . clonazepam  0.5 mg Oral TID  . collagenase   Topical BID  . darbepoetin (ARANESP) injection - DIALYSIS  100 mcg Intravenous Q Tue-HD  . feeding supplement (NEPRO CARB STEADY)  237 mL Oral TID BM  . fentaNYL  1 patch Transdermal Q72H  . guaiFENesin  15 mL Oral Q12H  . insulin aspart  0-5 Units Subcutaneous QHS  . insulin aspart  0-9 Units Subcutaneous TID WC  . mouth rinse  15 mL Mouth Rinse QID  . metoprolol tartrate  50 mg Oral BID  . midodrine  40 mg Oral TID WC  . multivitamin  1 tablet Oral QHS  . pantoprazole sodium  40 mg Oral BID  . QUEtiapine  100 mg Oral QHS  . QUEtiapine  50 mg Oral Daily  . sodium chloride flush  10-40 mL Intracatheter Q12H      Mallie Giambra Tanna Furry, MD 11/20/2020, 8:43 AM

## 2020-11-21 DIAGNOSIS — R000 Tachycardia, unspecified: Secondary | ICD-10-CM | POA: Diagnosis not present

## 2020-11-21 DIAGNOSIS — I4891 Unspecified atrial fibrillation: Secondary | ICD-10-CM | POA: Diagnosis not present

## 2020-11-21 LAB — GLUCOSE, CAPILLARY
Glucose-Capillary: 184 mg/dL — ABNORMAL HIGH (ref 70–99)
Glucose-Capillary: 203 mg/dL — ABNORMAL HIGH (ref 70–99)
Glucose-Capillary: 160 mg/dL — ABNORMAL HIGH (ref 70–99)
Glucose-Capillary: 129 mg/dL — ABNORMAL HIGH (ref 70–99)
Glucose-Capillary: 178 mg/dL — ABNORMAL HIGH (ref 70–99)

## 2020-11-21 LAB — BASIC METABOLIC PANEL
Anion gap: 12 (ref 5–15)
BUN: 57 mg/dL — ABNORMAL HIGH (ref 8–23)
CO2: 24 mmol/L (ref 22–32)
Calcium: 10.1 mg/dL (ref 8.9–10.3)
Chloride: 97 mmol/L — ABNORMAL LOW (ref 98–111)
Creatinine, Ser: 1.63 mg/dL — ABNORMAL HIGH (ref 0.44–1.00)
GFR, Estimated: 34 mL/min — ABNORMAL LOW (ref >60)
Glucose, Bld: 187 mg/dL — ABNORMAL HIGH (ref 70–99)
Potassium: 4.4 mmol/L (ref 3.5–5.1)
Sodium: 133 mmol/L — ABNORMAL LOW (ref 135–145)

## 2020-11-21 MED ORDER — APIXABAN 2.5 MG PO TABS
2.5000 mg | ORAL_TABLET | Freq: Two times a day (BID) | ORAL | Status: DC
  Administered 2020-11-21 – 2020-11-23 (×3): 2.5 mg
  Filled 2020-11-21 (×4): qty 1

## 2020-11-21 MED ORDER — VITAMIN D 25 MCG (1000 UNIT) PO TABS
2000.0000 [IU] | ORAL_TABLET | Freq: Every day | ORAL | Status: DC
  Administered 2020-11-22 – 2020-11-23 (×2): 2000 [IU]
  Filled 2020-11-21 (×2): qty 2

## 2020-11-21 MED ORDER — CLONAZEPAM 0.5 MG PO TBDP: 0.5000 mg | ORAL_TABLET | Freq: Three times a day (TID) | ORAL | Status: DC

## 2020-11-21 MED ORDER — QUETIAPINE FUMARATE 100 MG PO TABS: 100.0000 mg | ORAL_TABLET | Freq: Every day | ORAL | Status: DC

## 2020-11-21 MED ORDER — CLONAZEPAM 0.25 MG PO TBDP
0.2500 mg | ORAL_TABLET | Freq: Three times a day (TID) | ORAL | Status: DC
  Administered 2020-11-21 – 2020-11-23 (×3): 0.25 mg
  Filled 2020-11-21 (×4): qty 1

## 2020-11-21 MED ORDER — QUETIAPINE FUMARATE 50 MG PO TABS: 50.0000 mg | ORAL_TABLET | Freq: Every day | ORAL | Status: DC

## 2020-11-21 MED ORDER — OXYCODONE HCL 5 MG PO TABS
5.0000 mg | ORAL_TABLET | ORAL | Status: DC | PRN
  Administered 2020-11-22: 5 mg
  Filled 2020-11-21: qty 1

## 2020-11-21 MED ORDER — NEPRO/CARBSTEADY PO LIQD
237.0000 mL | Freq: Three times a day (TID) | ORAL | Status: DC
  Administered 2020-11-22: 237 mL

## 2020-11-21 MED ORDER — PANTOPRAZOLE SODIUM 40 MG PO PACK
40.0000 mg | PACK | Freq: Two times a day (BID) | ORAL | Status: DC
  Administered 2020-11-21 – 2020-11-22 (×2): 40 mg
  Filled 2020-11-21 (×4): qty 20

## 2020-11-21 MED ORDER — RESOURCE THICKENUP CLEAR PO POWD
ORAL | Status: DC | PRN
  Filled 2020-11-21: qty 125

## 2020-11-21 MED ORDER — GUAIFENESIN 100 MG/5ML PO SOLN
15.0000 mL | Freq: Two times a day (BID) | ORAL | Status: DC
  Administered 2020-11-21 – 2020-11-22 (×2): 300 mg
  Filled 2020-11-21 (×4): qty 15

## 2020-11-21 MED ORDER — ACETAMINOPHEN 160 MG/5ML PO SOLN: 650.0000 mg | Freq: Four times a day (QID) | ORAL | Status: DC | PRN

## 2020-11-21 MED ORDER — QUETIAPINE FUMARATE 25 MG PO TABS
25.0000 mg | ORAL_TABLET | Freq: Every day | ORAL | Status: DC
  Administered 2020-11-22 – 2020-11-24 (×3): 25 mg via ORAL
  Filled 2020-11-21 (×3): qty 1

## 2020-11-21 MED ORDER — DOCUSATE SODIUM 50 MG/5ML PO LIQD: 100.0000 mg | Freq: Two times a day (BID) | ORAL | Status: DC | PRN

## 2020-11-21 MED ORDER — RENA-VITE PO TABS
1.0000 | ORAL_TABLET | Freq: Every day | ORAL | Status: DC
  Administered 2020-11-21: 1
  Filled 2020-11-21 (×2): qty 1

## 2020-11-21 MED ORDER — METOPROLOL TARTRATE 25 MG/10 ML ORAL SUSPENSION
50.0000 mg | Freq: Two times a day (BID) | ORAL | Status: DC
  Administered 2020-11-21 – 2020-11-23 (×3): 50 mg
  Filled 2020-11-21 (×4): qty 20

## 2020-11-21 MED ORDER — QUETIAPINE FUMARATE 50 MG PO TABS
50.0000 mg | ORAL_TABLET | Freq: Every day | ORAL | Status: DC
  Administered 2020-11-21 – 2020-11-23 (×2): 50 mg via ORAL
  Filled 2020-11-21 (×3): qty 1

## 2020-11-21 MED ORDER — CHLORHEXIDINE GLUCONATE CLOTH 2 % EX PADS: 6.0000 | MEDICATED_PAD | Freq: Every day | CUTANEOUS | Status: DC

## 2020-11-21 MED ORDER — MIDODRINE HCL 5 MG PO TABS
40.0000 mg | ORAL_TABLET | Freq: Three times a day (TID) | ORAL | Status: DC
  Administered 2020-11-22 – 2020-11-23 (×4): 40 mg
  Filled 2020-11-21 (×4): qty 8

## 2020-11-21 NOTE — Progress Notes (Signed)
[facility] Progress Note     Assessment:   1. HD dep't AKI.CRRT stopped 11/03/2020.[place] placed on 10/19/20. Family desired ongoing full scope of care, Now on IHD 2. SVT 3. Hyperkalemia; stable 4. H/o mixed shock: hemorrhagic and septic shock; stable 5. Anemia;  on ESA 6. Perforated duodenal ulcer s/p exlap and graham patch placement 7. 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 ARDS 2/2 COVID-19 Pneumonia 9. A.fib/ DVT 10. Severe calorie malnutrition 11. H/o stage IV colon cancer 12. Hypercalcemia- immobility, stable 13. Metabolic Acidosis, mild, trend with HD  PLAN: - HD on 1/21, tolerated well except intradialytic hypotension.  Total UF 1.5 L.  Plan for next HD on 1/24.  Continue current medications.  Subjective:   No new event.  Lying in bed.  Alert awake and following commands.   Objective:   BP 106/60 (BP Location: Left Arm)   Pulse (!) 113   Temp 99.1 F (37.3 C) (Axillary)   Resp (!) 25   Ht 5' 8"  (1.727 m)   Wt 92.6 kg   SpO2 100%   BMI 31.04 kg/m   Intake/Output Summary (Last 24 hours) at 11/21/2020 4008 Last data filed at 11/21/2020 0800 Gross per 24 hour  Intake 110 ml  Output 150 ml  Net -40 ml   Weight change:   Physical Exam: Gen: Chronically ill looking female lying on bed comfortable HEENT: trach in place CVS: tachy, S1-S2 normal Resp: unlabored, trach Abd: soft, nontender Ext:  No edema appreciated Neuro: follows commands, awake, slow to respond ACCESS: R IJ [facility]  Imaging: ECHOCARDIOGRAM COMPLETE  Result Date: 11/19/2020    ECHOCARDIOGRAM REPORT   Patient Name:   Candace [name] Date of Exam: 11/19/2020 Medical Rec #:  [id number]    Height:       68.0 in Accession #:    [id number]   Weight:       204.1 lb Date of Birth:  22-Sep-1951    BSA:          2.062 m Patient Age:    70 years     BP:           120/78 mmHg Patient Gender: F            HR:           107 bpm. Exam Location:  Inpatient Procedure: 2D Echo, Color Doppler, Cardiac Doppler  and Intracardiac            Opacification Agent Indications:    I48.91* Unspeicified atrial fibrillation  History:        Patient has prior history of Echocardiogram examinations, most                 recent 09/01/2020. Risk Factors:Hypertension, Dyslipidemia and                 Diabetes.  Sonographer:    Bernadene Person RDCS Referring Phys: [place]  1. Mild hypokinesis of the inferior, inferoseptal, and anteroseptal myocardium. Left ventricular ejection fraction, by estimation, is 55 to 60%. The left ventricle has normal function. The left ventricle demonstrates regional wall motion abnormalities (see scoring diagram/findings for description). Left ventricular diastolic parameters are consistent with Grade I diastolic dysfunction (impaired relaxation).  2. Right ventricular systolic function is normal. The right ventricular size is normal. There is normal pulmonary artery systolic pressure.  3. The mitral valve is normal in structure. Trivial mitral valve regurgitation. No evidence of mitral stenosis.  4.  The aortic valve is tricuspid. Aortic valve regurgitation is trivial. No aortic stenosis is present.  5. Pulmonic valve regurgitation is moderate.  6. The inferior vena cava is normal in size with greater than 50% respiratory variability, suggesting right atrial pressure of 3 mmHg. FINDINGS  Left Ventricle: Mild hypokinesis of the inferior, inferoseptal, and anteroseptal myocardium. Left ventricular ejection fraction, by estimation, is 55 to 60%. The left ventricle has normal function. The left ventricle demonstrates regional wall motion abnormalities. Definity contrast agent was given IV to delineate the left ventricular endocardial borders. The left ventricular internal cavity size was normal in size. There is no left ventricular hypertrophy. Left ventricular diastolic parameters are consistent with Grade I diastolic dysfunction (impaired relaxation). Right Ventricle: The right ventricular  size is normal. No increase in right ventricular wall thickness. Right ventricular systolic function is normal. There is normal pulmonary artery systolic pressure. The tricuspid regurgitant velocity is 1.57 m/s, and  with an assumed right atrial pressure of 3 mmHg, the estimated right ventricular systolic pressure is 83.3 mmHg. Left Atrium: Left atrial size was normal in size. Right Atrium: Right atrial size was normal in size. Pericardium: There is no evidence of pericardial effusion. Mitral Valve: The mitral valve is normal in structure. Trivial mitral valve regurgitation. No evidence of mitral valve stenosis. Tricuspid Valve: The tricuspid valve is normal in structure. Tricuspid valve regurgitation is trivial. No evidence of tricuspid stenosis. Aortic Valve: The aortic valve is tricuspid. Aortic valve regurgitation is trivial. No aortic stenosis is present. Pulmonic Valve: The pulmonic valve was normal in structure. Pulmonic valve regurgitation is moderate. No evidence of pulmonic stenosis. Aorta: The aortic root is normal in size and structure. Venous: The inferior vena cava is normal in size with greater than 50% respiratory variability, suggesting right atrial pressure of 3 mmHg. IAS/Shunts: No atrial level shunt detected by color flow Doppler. Additional Comments: Compared with the echo 38/3291, systolic function is slightly worse.  LEFT VENTRICLE PLAX 2D LVIDd:         4.60 cm LVIDs:         2.80 cm LV PW:         0.90 cm LV IVS:        0.90 cm LVOT diam:     2.00 cm LV SV:         50 LV SV Index:   24 LVOT Area:     3.14 cm  RIGHT VENTRICLE TAPSE (M-mode): 1.7 cm LEFT ATRIUM             Index       RIGHT ATRIUM           Index LA diam:        3.00 cm 1.46 cm/m  RA Area:     13.80 cm LA Vol (A2C):   28.7 ml 13.92 ml/m RA Volume:   36.70 ml  17.80 ml/m LA Vol (A4C):   26.9 ml 13.05 ml/m LA Biplane Vol: 28.1 ml 13.63 ml/m  AORTIC VALVE LVOT Vmax:   84.87 cm/s LVOT Vmean:  57.600 cm/s LVOT VTI:    0.160  m  AORTA Ao Root diam: 3.10 cm Ao Asc diam:  3.00 cm TRICUSPID VALVE TR Peak grad:   9.9 mmHg TR Vmax:        157.00 cm/s  SHUNTS Systemic VTI:  0.16 m Systemic Diam: 2.00 cm Skeet Latch MD Electronically signed by Skeet Latch MD Signature Date/Time: 11/19/2020/3:57:22 PM    Final  Labs: BMET Recent Labs  Lab 11/15/20 0543 11/16/20 0456 11/17/20 0601 11/19/20 0627 11/20/20 0629 11/21/20 0217  NA 141 142 138 144 131* 133*  K 3.3* 3.4* 3.6 4.4 4.0 4.4  CL 106 109 104 107 96* 97*  CO2 18* 17* 24 23 22 24   GLUCOSE 109* 204* 163* 97 78 187*  BUN 139* 160* 80* 103* 46* 57*  CREATININE 1.74* 1.78* 0.95 1.10* 1.19* 1.63*  CALCIUM 8.8* 8.3* 8.7* 10.0 9.8 10.1  PHOS 9.3*  --  4.1 5.3*  --   --    CBC Recent Labs  Lab 11/17/20 0601 11/19/20 1100  WBC 8.9 10.4  HGB 8.3* 8.6*  HCT 28.1* 29.8*  MCV 90.6 91.4  PLT 311 352    Medications:    . apixaban  2.5 mg Oral BID  . chlorhexidine gluconate (MEDLINE KIT)  15 mL Mouth Rinse BID  . Chlorhexidine Gluconate Cloth  6 each Topical Daily  . cholecalciferol  2,000 Units Oral Daily  . clonazepam  0.5 mg Oral TID  . collagenase   Topical BID  . darbepoetin (ARANESP) injection - DIALYSIS  100 mcg Intravenous Q Tue-HD  . feeding supplement (NEPRO CARB STEADY)  237 mL Oral TID BM  . feeding supplement (NEPRO CARB STEADY)  840 mL Per Tube Q24H  . [START ON 11/22/2020] feeding supplement (NEPRO CARB STEADY)  980 mL Per Tube Q24H  . feeding supplement (PROSource TF)  45 mL Per Tube QID  . fentaNYL  1 patch Transdermal Q72H  . free water  30 mL Per Tube Q4H  . guaiFENesin  15 mL Oral Q12H  . insulin aspart  0-5 Units Subcutaneous QHS  . insulin aspart  0-9 Units Subcutaneous TID WC  . mouth rinse  15 mL Mouth Rinse QID  . metoprolol tartrate  50 mg Oral BID  . midodrine  40 mg Oral TID WC  . multivitamin  1 tablet Oral QHS  . nutrition supplement (JUVEN)  1 packet Per Tube BID BM  . pantoprazole sodium  40 mg Oral BID  .  QUEtiapine  100 mg Oral QHS  . QUEtiapine  50 mg Oral Daily  . sodium chloride flush  10-40 mL Intracatheter Q12H      Tenoch Mcclure Tanna Furry, MD 11/21/2020, 9:24 AM

## 2020-11-21 NOTE — Progress Notes (Signed)
PROGRESS NOTE    Rodneisha Bonnet  TMA:[id number] DOB: [birth year] DOA: 08/31/2020 PCP: Algis Greenhouse, MD    Brief Narrative:  Receiving this patient after 77 days in intensive care unit, most of the events are as per hospital records and previous providers documentation.  Patient does have history of colon cancer status post colon resection with colostomy bag, hyperlipidemia, type 2 diabetes, stage III chronic kidney disease.  She was admitted to the hospital and treated for COVID-19 pneumonia and was discharged back home.  Sent to the ER by her home care nurse due to elevated heart rate. 70 year old female who was recently diagnosed with COVID on 08/23/2020 came back to the ER with not feeling well, she was admitted on 08/31/2020 with A. fib with RVR, hemorrhagic shock and found to have perforated duodenal ulcer for that she underwent exploratory laparotomy on 11/3.  She has extensive ICU stay with significant events as below.  11/2 Admitted with rapid Afib 11/3 OR with findings of perforated duodenal ulcer 11/10 progressive hemorrhagic shock, intubated, transfused, pressors, proned; started on CRRT in PM 11/16 Extubated. Re-intubated overnight due to respiratory distress and hypoxia with decreased mentation 11/18 Bronch, cultures sent 11/19 Hgb down getting blood 11/24 Spiked fever, resume empirical antimicrobial therapy 11/26 Hemorrhagic shock, hgb 5.6, increased pressors,  11/30 Per palliative "[name] understanding that patient is unfortunately very fragiledespite ongoing intensive medical treatment and full mechanical support. [name] that the familywantsto continue with all current interventions despite potential outcomes". 12/08 CRRT discontinued due to clotting 12/09 Family requested transfer to tertiary care [facility]). [facility] denied transfer  12/10 CRRT restarted. Episodes of tachycardia, tachypnea that seem to improve with pain management 12/11 Back in shock. Pressor  requirements up. CXR worse. ABX resumed 12/12 Still requiring inc pressors. Had hypoglycemic event. Basal insulin dosing adjusted 12/13 Pressor requirements better. Now hyperglycemic. Re-adjusted Glycemic control  12/19 Afebrile . Remains on dilaudid and heparin gtt, dilaudid gtt increased overnight for concern of pain / ongoing tachycardia, no other events . NE and precedex off 12/17. Ongoing CRRT- even UF, dosing lokelmia this morning 12/20 On CRRT.  Renal plans for HD cath and moving to HD. Getting wound care 12/24 - renal stopping CRRT today and plans HD 10/24/20 . 40% fio2 on vent via Trach. TAchypenic and tachycardic. Afebrile but wbc up to 27.6K. On TF. On heparin gtt 12/25 - Back on CRRT. On vent via trach at 40% fio2, On scheduled dilaudid as add on to oxy. Per family request 12/24 - they felt scheduled oxy was not adequate and patient was showing signs of opioid withdrawal.  Patient also had  worsening SIRS/sepsis syndrome. Had fever, rising wbc, worsening RR 40-60 and HR 140s sinus-> started On abx yesterday. Fever some better today. WBC plateau at 28,.5K. On new levophed -> signifanct improvement in HR 77 and RR t0 20. On heparin gtt.  On precedex gtt. On levophed gtt 70mg wthi midodrine. On TF 12/30 Remains on CRRT with intermittent pressor requirements. Ethics consult placed 12/29. Ethics rec time trial of CRRT 12/31 failed SBT with RR 40s. Several conversations between care tam and family, who are upset RE plan of care 1/1 back on pressors  1/4: On pressors, keeping even on CVVHD, HGB drop to 6.9, transfused 1 unit. Improving mental status 1/10 remains on low dose levophed 267m 1/11 off levo, attempting HD with UF for first time. Now tolerating intermittent HD. 1/19 patient transferred to progressive bed with tracheostomy on 35% FiO2.  has NG tube  feeding.  No PEG tube.  Received dialysis on 1/18 and 1/21  Assessment & Plan:   Principal Problem:   Atrial fibrillation with RVR  (HCC) Active Problems:   Acute respiratory failure due to COVID-19 [facility])   New onset a-fib (HCC)   Leukocytosis   Atrial fibrillation with rapid ventricular response (HCC)   Hypoxia   Pressure injury of skin   Acute hypoxemic respiratory failure (HCC)   ARDS (adult respiratory distress syndrome) (HCC)   Perforated duodenal ulcer ([place])   On mechanically assisted ventilation ([place])   Palliative care by specialist   Goals of care, counseling/discussion   Shock ([place])   Acute and chronic respiratory failure (acute-on-chronic) ([place])   Status post tracheostomy ([place])   ESRD (end stage renal disease) ([place])  Paroxysmal A. fib with RVR: Now with sinus tachycardia.  Treated with as needed metoprolol IV.  Now her heart rate is consistently elevated. Therapeutic on Eliquis.  Sinus rhythm.   Started on oral metoprolol.  Seen by cardiology.  Recommended metoprolol with holding parameters.  No new recommendations. Stable heart rate today.  Acute respiratory failure due to COVID-19 pneumonia: Status post tracheostomy.  Multiple bacterial pneumonias.  VDRF: Completed all antibiotic therapy. Currently on trach collar with 28% FiO2.   Respiratory therapy and PCCM following.  Aggressive suctioning and chest physiotherapy as much possible.  Wean as possible. Patient was able to tolerate Passy-Muir valve.  She passed modified barium swallow evaluation and able to start on dysphagia 2 diet with thin liquids, however no enough intake. Will ask PCCM tomorrow if they can downsize trach.  Acute metabolic encephalopathy: Improved much. Patient is able to follow commands and have conversation.   Extremely debilitated.  Generalized weakness but no focal neurological deficit. She has much improved mentation now.  We will decrease her dose of Klonopin and Seroquel to half the doses with ultimate plan to taper off.  hemorrhagic shock: Resolved now.  Blood pressures are adequate.  Hemoglobin is adequate.  AKI due to  sepsis: Currently on intermittent hemodialysis.  Remains on midodrine for blood pressure support. Family interested on dialysis at home, social worker and nephrology following. Receiving intermittent dialysis.  Last dialysis on 1/21.  A. Fib/DVT of lower extremity on anticoagulation: Continue Eliquis.  Tolerating.   Anemia of critical illness: Multiple transfusions.  Total 12 units of PRBC.  Last PRBC given on 11/11/2020 and has remained stable.  We will continue to closely monitor.  Critical illness myopathy/severe physical deconditioning/inadequate oral intake: Dobbhoff tube feeding present for more than 2 months. He started on dysphagia diet, however patient is not eating and and is hypoglycemic despite discontinuing insulin doses. Continue oral diet with supervision, 1/22 Patient will not meet enough nutritional intake with current diet, started on nocturnal feeding.  Type 2 diabetes with hyperglycemia: Episodes of hypoglycemia after stopping tube feedings. Continue to use sliding scale insulin.  Discontinue long-acting insulins. Now on nocturnal tube feeding.  Stage IV sacral decubitus ulcer: Not present on admission.  Patient has extensive sacral decubitus ulcer.  Followed by wound care. Seen by surgery.  Recommended against general anesthesia and surgical procedure.  Local wound care instructions present. Currently receiving hydrotherapy.  History of stage IV colon cancer: She has a colostomy.  Outpatient follow-up.  Pain management: Fentanyl patch, Dilaudid as needed rarely using. Seroquel 50 in the morning and 100 in the evening.  Currently optimal pain management. Reducing dose of Seroquel and Klonopin to help her stay awake.   DVT prophylaxis:  apixaban (ELIQUIS) tablet 2.5 mg Start: 11/21/20 2200 Place and maintain sequential compression device Start: 09/07/20 1349 Place TED hose Start: 08/31/20 1711 apixaban (ELIQUIS) tablet 2.5 mg   Code Status: Full code Family  Communication: Patient's daughter on phone with nurse. Disposition Plan: Status is: Inpatient  Remains inpatient appropriate because:Unsafe d/c plan and Inpatient level of care appropriate due to severity of illness   Dispo: The patient is from: Home              Anticipated d/c is to: Home with home health              Anticipated d/c date is: > 3 days              Patient currently is not medically stable to d/c.  Consultants:   Cardiology  Surgery  Nephrology  Ethics  PCCM  Procedures:  R PICC 11/5 >> A line 11/9 >> out ETT 11/9 > 11/16, 11/16 >> 09/21/2020, 09/21/2020 tracheostomy>> Lt [place] CVL 11/9 >> R IJ trialysis >> out HD catheter 12/1 >>12/20 12/21 - 14.5 Fr, 23 cm right IJ tunneled hemodialysis catheter placement. Removal of indwelling subclavian catheter.   Antimicrobials:  11/10 MRSA PCR > neg 11/10 BC x 2 > neg 09/22/2020 blood cultures x2>> S epi.  09/22/2020 sputum culture>> MRSA Blood 12/1 >> negative. S.epi 1 out of 2, likely contaminant BCx 2 12/12 >> neg xxx Trach aspirate 12/24 - FEW STAPHYLOCOCCUS AUREUS  Blood 12/24 > Negative  Blood 1/2: neg Sputum 1/4: MSSA  Antibiotics  azithro 11/2 >11/3 Ceftriaxone 11/2  Fluconazole 11/3 Zosyn 11/3 >> 11/7 Vanc 11/10 off Cefepime 11/10 > 11/16 09/22/2020 vancomycin for MRSA PNA >> 12/2 xxxx Vanc 12/11> 12/17 Zosyn 12/11> 12/17 xxxx vanc 12/24 (staph resp cutlure) >> 12/28 Zosyn 12/24 - 12/27 Cefazolin 12/27 >>1/1 linezolid 1/2 eraxis 1/2  Meropenem 1/2 > 1/8   Subjective: Patient seen and examined. No overnight events. She did not eat any food yesterday and today. Received tube feeding last night . She expressed something but could not understand.  Objective: Vitals:   11/21/20 0321 11/21/20 0718 11/21/20 0825 11/21/20 1024  BP: 109/60  106/60 119/69  Pulse: (!) 108  (!) 113 (!) 122  Resp: 18  (!) 25   Temp: 99.7 F (37.6 C)  99.1 F (37.3 C)   TempSrc: Axillary  Axillary    SpO2: 100% 97% 100%   Weight:      Height:        Intake/Output Summary (Last 24 hours) at 11/21/2020 1140 Last data filed at 11/21/2020 0800 Gross per 24 hour  Intake 110 ml  Output 150 ml  Net -40 ml   Filed Weights   11/16/20 1500 11/16/20 1802 11/16/20 2120  Weight: 86 kg 85 kg 92.6 kg    Examination:  General exam: Sick and frail looking. Not in any distress at rest, frail and debilitated. Respiratory system: Conducted upper airway sounds.  Otherwise bilateral clear.  Today she is on 28% FiO2.  Intermittently coughing. Cardiovascular system: S1 & S2 heard, RRR.  Heart rate is stable. Gastrointestinal system: Abdomen is nondistended, soft.  No localized tenderness.  She has a right lower quadrant colostomy bag with brown liquidy stool.  Midline incision with granulation tissue, no evidence of infection.. Central nervous system: Alert and awake.  Follows commands.  Looks appropriate.   Extremities: Extremely debilitated.  3/5 all extremities. Skin: Stage IV sacral decubitus ulcer with undetermined base, pictures in the chart.  Data Reviewed: I have personally reviewed following labs and imaging studies  CBC: Recent Labs  Lab 11/17/20 0601 11/19/20 1100  WBC 8.9 10.4  HGB 8.3* 8.6*  HCT 28.1* 29.8*  MCV 90.6 91.4  PLT 311 935   Basic Metabolic Panel: Recent Labs  Lab 11/15/20 0543 11/16/20 0456 11/17/20 0601 11/19/20 0627 11/20/20 0629 11/21/20 0217  NA 141 142 138 144 131* 133*  K 3.3* 3.4* 3.6 4.4 4.0 4.4  CL 106 109 104 107 96* 97*  CO2 18* 17* 24 23 22 24   GLUCOSE 109* 204* 163* 97 78 187*  BUN 139* 160* 80* 103* 46* 57*  CREATININE 1.74* 1.78* 0.95 1.10* 1.19* 1.63*  CALCIUM 8.8* 8.3* 8.7* 10.0 9.8 10.1  MG 1.8 1.8 1.5* 1.5* 2.9*  --   PHOS 9.3*  --  4.1 5.3*  --   --    GFR: Estimated Creatinine Clearance: 38.8 mL/min (A) (by C-G formula based on SCr of 1.63 mg/dL (H)). Liver Function Tests: No results for input(s): AST, ALT, ALKPHOS,  BILITOT, PROT, ALBUMIN in the last 168 hours. No results for input(s): LIPASE, AMYLASE in the last 168 hours. No results for input(s): AMMONIA in the last 168 hours. Coagulation Profile: No results for input(s): INR, PROTIME in the last 168 hours. Cardiac Enzymes: No results for input(s): CKTOTAL, CKMB, CKMBINDEX, TROPONINI in the last 168 hours. BNP (last 3 results) No results for input(s): PROBNP in the last 8760 hours. HbA1C: No results for input(s): HGBA1C in the last 72 hours. CBG: Recent Labs  Lab 11/20/20 1659 11/20/20 2051 11/20/20 2309 11/21/20 0321 11/21/20 0825  GLUCAP 98 150* 146* 184* 203*   Lipid Profile: No results for input(s): CHOL, HDL, LDLCALC, TRIG, CHOLHDL, LDLDIRECT in the last 72 hours. Thyroid Function Tests: Recent Labs    11/19/20 1630  TSH 2.253  FREET4 0.94   Anemia Panel: No results for input(s): VITAMINB12, FOLATE, FERRITIN, TIBC, IRON, RETICCTPCT in the last 72 hours. Sepsis Labs: No results for input(s): PROCALCITON, LATICACIDVEN in the last 168 hours.  No results found for this or any previous visit (from the past 240 hour(s)).       Radiology Studies: ECHOCARDIOGRAM COMPLETE  Result Date: 11/19/2020    ECHOCARDIOGRAM REPORT   Patient Name:   YASUKO LAPAGE Date of Exam: 11/19/2020 Medical Rec #:  [id number]    Height:       68.0 in Accession #:    [id number]   Weight:       204.1 lb Date of Birth:  11/20/1950    BSA:          2.062 m Patient Age:    47 years     BP:           120/78 mmHg Patient Gender: F            HR:           107 bpm. Exam Location:  Inpatient Procedure: 2D Echo, Color Doppler, Cardiac Doppler and Intracardiac            Opacification Agent Indications:    I48.91* Unspeicified atrial fibrillation  History:        Patient has prior history of Echocardiogram examinations, most                 recent 09/01/2020. Risk Factors:Hypertension, Dyslipidemia and                 Diabetes.  Sonographer:    Bernadene Person RDCS  Referring  Phys: [place]  1. Mild hypokinesis of the inferior, inferoseptal, and anteroseptal myocardium. Left ventricular ejection fraction, by estimation, is 55 to 60%. The left ventricle has normal function. The left ventricle demonstrates regional wall motion abnormalities (see scoring diagram/findings for description). Left ventricular diastolic parameters are consistent with Grade I diastolic dysfunction (impaired relaxation).  2. Right ventricular systolic function is normal. The right ventricular size is normal. There is normal pulmonary artery systolic pressure.  3. The mitral valve is normal in structure. Trivial mitral valve regurgitation. No evidence of mitral stenosis.  4. The aortic valve is tricuspid. Aortic valve regurgitation is trivial. No aortic stenosis is present.  5. Pulmonic valve regurgitation is moderate.  6. The inferior vena cava is normal in size with greater than 50% respiratory variability, suggesting right atrial pressure of 3 mmHg. FINDINGS  Left Ventricle: Mild hypokinesis of the inferior, inferoseptal, and anteroseptal myocardium. Left ventricular ejection fraction, by estimation, is 55 to 60%. The left ventricle has normal function. The left ventricle demonstrates regional wall motion abnormalities. Definity contrast agent was given IV to delineate the left ventricular endocardial borders. The left ventricular internal cavity size was normal in size. There is no left ventricular hypertrophy. Left ventricular diastolic parameters are consistent with Grade I diastolic dysfunction (impaired relaxation). Right Ventricle: The right ventricular size is normal. No increase in right ventricular wall thickness. Right ventricular systolic function is normal. There is normal pulmonary artery systolic pressure. The tricuspid regurgitant velocity is 1.57 m/s, and  with an assumed right atrial pressure of 3 mmHg, the estimated right ventricular systolic pressure is 86.7 mmHg. Left  Atrium: Left atrial size was normal in size. Right Atrium: Right atrial size was normal in size. Pericardium: There is no evidence of pericardial effusion. Mitral Valve: The mitral valve is normal in structure. Trivial mitral valve regurgitation. No evidence of mitral valve stenosis. Tricuspid Valve: The tricuspid valve is normal in structure. Tricuspid valve regurgitation is trivial. No evidence of tricuspid stenosis. Aortic Valve: The aortic valve is tricuspid. Aortic valve regurgitation is trivial. No aortic stenosis is present. Pulmonic Valve: The pulmonic valve was normal in structure. Pulmonic valve regurgitation is moderate. No evidence of pulmonic stenosis. Aorta: The aortic root is normal in size and structure. Venous: The inferior vena cava is normal in size with greater than 50% respiratory variability, suggesting right atrial pressure of 3 mmHg. IAS/Shunts: No atrial level shunt detected by color flow Doppler. Additional Comments: Compared with the echo 67/2094, systolic function is slightly worse.  LEFT VENTRICLE PLAX 2D LVIDd:         4.60 cm LVIDs:         2.80 cm LV PW:         0.90 cm LV IVS:        0.90 cm LVOT diam:     2.00 cm LV SV:         50 LV SV Index:   24 LVOT Area:     3.14 cm  RIGHT VENTRICLE TAPSE (M-mode): 1.7 cm LEFT ATRIUM             Index       RIGHT ATRIUM           Index LA diam:        3.00 cm 1.46 cm/m  RA Area:     13.80 cm LA Vol (A2C):   28.7 ml 13.92 ml/m RA Volume:   36.70 ml  17.80 ml/m  LA Vol (A4C):   26.9 ml 13.05 ml/m LA Biplane Vol: 28.1 ml 13.63 ml/m  AORTIC VALVE LVOT Vmax:   84.87 cm/s LVOT Vmean:  57.600 cm/s LVOT VTI:    0.160 m  AORTA Ao Root diam: 3.10 cm Ao Asc diam:  3.00 cm TRICUSPID VALVE TR Peak grad:   9.9 mmHg TR Vmax:        157.00 cm/s  SHUNTS Systemic VTI:  0.16 m Systemic Diam: 2.00 cm Skeet Latch MD Electronically signed by Skeet Latch MD Signature Date/Time: 11/19/2020/3:57:22 PM    Final         Scheduled Meds: . apixaban   2.5 mg Per Tube BID  . chlorhexidine gluconate (MEDLINE KIT)  15 mL Mouth Rinse BID  . Chlorhexidine Gluconate Cloth  6 each Topical Daily  . [START ON 11/22/2020] cholecalciferol  2,000 Units Per Tube Daily  . clonazepam  0.25 mg Per Tube TID  . collagenase   Topical BID  . darbepoetin (ARANESP) injection - DIALYSIS  100 mcg Intravenous Q Tue-HD  . feeding supplement (NEPRO CARB STEADY)  237 mL Per Tube TID BM  . feeding supplement (NEPRO CARB STEADY)  840 mL Per Tube Q24H  . [START ON 11/22/2020] feeding supplement (NEPRO CARB STEADY)  980 mL Per Tube Q24H  . feeding supplement (PROSource TF)  45 mL Per Tube QID  . fentaNYL  1 patch Transdermal Q72H  . free water  30 mL Per Tube Q4H  . guaiFENesin  15 mL Per Tube Q12H  . insulin aspart  0-5 Units Subcutaneous QHS  . insulin aspart  0-9 Units Subcutaneous TID WC  . mouth rinse  15 mL Mouth Rinse QID  . metoprolol tartrate  50 mg Per Tube BID  . midodrine  40 mg Per Tube TID WC  . multivitamin  1 tablet Per Tube QHS  . nutrition supplement (JUVEN)  1 packet Per Tube BID BM  . pantoprazole sodium  40 mg Per Tube BID  . [START ON 11/22/2020] QUEtiapine  25 mg Oral Daily  . QUEtiapine  50 mg Oral QHS  . sodium chloride flush  10-40 mL Intracatheter Q12H   Continuous Infusions: . sodium chloride 5 mL/hr at 11/16/20 0935  . sodium chloride    . sodium chloride    . sodium chloride    . albumin human 25 g (11/16/20 1516)     LOS: 81 days    Time spent: 30 minutes    Barb Merino, MD [facility] Pager [phone]

## 2020-11-22 DIAGNOSIS — I4891 Unspecified atrial fibrillation: Secondary | ICD-10-CM | POA: Diagnosis not present

## 2020-11-22 DIAGNOSIS — Z930 Tracheostomy status: Secondary | ICD-10-CM | POA: Diagnosis not present

## 2020-11-22 DIAGNOSIS — J9621 Acute and chronic respiratory failure with hypoxia: Secondary | ICD-10-CM | POA: Diagnosis not present

## 2020-11-22 DIAGNOSIS — R000 Tachycardia, unspecified: Secondary | ICD-10-CM | POA: Diagnosis not present

## 2020-11-22 DIAGNOSIS — I480 Paroxysmal atrial fibrillation: Secondary | ICD-10-CM

## 2020-11-22 LAB — CBC
HCT: 31.6 % — ABNORMAL LOW (ref 36.0–46.0)
Hemoglobin: 9.5 g/dL — ABNORMAL LOW (ref 12.0–15.0)
MCH: 26.8 pg (ref 26.0–34.0)
MCHC: 30.1 g/dL (ref 30.0–36.0)
MCV: 89 fL (ref 80.0–100.0)
Platelets: 365 K/uL (ref 150–400)
RBC: 3.55 MIL/uL — ABNORMAL LOW (ref 3.87–5.11)
RDW: 16.2 % — ABNORMAL HIGH (ref 11.5–15.5)
WBC: 9.2 K/uL (ref 4.0–10.5)
nRBC: 0 % (ref 0.0–0.2)

## 2020-11-22 LAB — RENAL FUNCTION PANEL
Albumin: 2.5 g/dL — ABNORMAL LOW (ref 3.5–5.0)
Anion gap: 15 (ref 5–15)
BUN: 61 mg/dL — ABNORMAL HIGH (ref 8–23)
CO2: 22 mmol/L (ref 22–32)
Calcium: 10.5 mg/dL — ABNORMAL HIGH (ref 8.9–10.3)
Chloride: 98 mmol/L (ref 98–111)
Creatinine, Ser: 1.59 mg/dL — ABNORMAL HIGH (ref 0.44–1.00)
GFR, Estimated: 35 mL/min — ABNORMAL LOW (ref >60)
Glucose, Bld: 114 mg/dL — ABNORMAL HIGH (ref 70–99)
Phosphorus: 7 mg/dL — ABNORMAL HIGH (ref 2.5–4.6)
Potassium: 3.4 mmol/L — ABNORMAL LOW (ref 3.5–5.1)
Sodium: 135 mmol/L (ref 135–145)

## 2020-11-22 LAB — GLUCOSE, CAPILLARY
Glucose-Capillary: 91 mg/dL (ref 70–99)
Glucose-Capillary: 158 mg/dL — ABNORMAL HIGH (ref 70–99)
Glucose-Capillary: 150 mg/dL — ABNORMAL HIGH (ref 70–99)

## 2020-11-22 MED ORDER — SODIUM BICARBONATE 650 MG PO TABS
650.0000 mg | ORAL_TABLET | Freq: Once | ORAL | Status: DC
  Filled 2020-11-22: qty 1

## 2020-11-22 MED ORDER — PROSOURCE PLUS PO LIQD
30.0000 mL | Freq: Three times a day (TID) | ORAL | Status: DC
  Administered 2020-11-23 – 2020-11-26 (×7): 30 mL via ORAL
  Filled 2020-11-22 (×13): qty 30

## 2020-11-22 MED ORDER — PANCRELIPASE (LIP-PROT-AMYL) 10440-39150 UNITS PO TABS
20880.0000 [IU] | ORAL_TABLET | Freq: Once | ORAL | Status: DC
  Filled 2020-11-22: qty 2

## 2020-11-22 MED ORDER — NEPRO/CARBSTEADY PO LIQD
237.0000 mL | Freq: Three times a day (TID) | ORAL | Status: DC
  Administered 2020-11-22 – 2020-11-23 (×2): 237 mL

## 2020-11-22 MED ORDER — HYDROMORPHONE HCL 1 MG/ML IJ SOLN
INTRAMUSCULAR | Status: AC
  Filled 2020-11-22: qty 0.5

## 2020-11-22 MED ORDER — ALTEPLASE 2 MG IJ SOLR
2.0000 mg | Freq: Once | INTRAMUSCULAR | Status: AC
  Administered 2020-11-23: 2 mg
  Filled 2020-11-22: qty 2

## 2020-11-22 MED ORDER — HEPARIN SODIUM (PORCINE) 1000 UNIT/ML IJ SOLN
INTRAMUSCULAR | Status: AC
  Filled 2020-11-22: qty 4

## 2020-11-22 MED ORDER — HYDROMORPHONE HCL 1 MG/ML IJ SOLN
0.5000 mg | Freq: Once | INTRAMUSCULAR | Status: AC
  Administered 2020-11-23: 0.5 mg via INTRAVENOUS
  Filled 2020-11-22: qty 1

## 2020-11-22 NOTE — Progress Notes (Signed)
[facility] Progress Note     Assessment:   1. HD dep't AKI.CRRT stopped 11/03/2020.Harding placed on 10/19/20. Family desired ongoing full scope of care, Now on IHD 2. SVT 3. Hyperkalemia; stable 4. H/o mixed shock: hemorrhagic and septic shock; stable 5. Anemia;  on ESA 6. Perforated duodenal ulcer s/p exlap and graham patch placement 7. 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 ARDS 2/2 COVID-19 Pneumonia 9. A.fib/ DVT 10. Severe calorie malnutrition 11. H/o stage IV colon cancer 12. Hypercalcemia- immobility, stable 13. Metabolic Acidosis, mild, trend with HD  PLAN: - HD today, UF goal is 2L, tolerating 25% into tx.   Continue current medications.  Subjective:   No new event.  Lying in bed on HD.  Alert awake and following commands.   Objective:   BP 105/72 (BP Location: Left Arm)   Pulse 99   Temp 98.3 F (36.8 C) (Axillary)   Resp 20   Ht 5' 8"  (1.727 m)   Wt 92.6 kg   SpO2 99%   BMI 31.04 kg/m   Intake/Output Summary (Last 24 hours) at 11/22/2020 0750 Last data filed at 11/22/2020 9242 Gross per 24 hour  Intake 980 ml  Output 800 ml  Net 180 ml   Weight change:   Physical Exam: Gen: Chronically ill looking female lying on bed comfortable HEENT: trach in place CVS: tachy, S1-S2 normal Resp: unlabored, trach Abd: soft, nontender Ext:  No edema appreciated Neuro: follows commands, awake, slow to respond ACCESS: R IJ TDC  Imaging: No results found.  Labs: BMET Recent Labs  Lab 11/16/20 0456 11/17/20 0601 11/19/20 0627 11/20/20 0629 11/21/20 0217  NA 142 138 144 131* 133*  K 3.4* 3.6 4.4 4.0 4.4  CL 109 104 107 96* 97*  CO2 17* 24 23 22 24   GLUCOSE 204* 163* 97 78 187*  BUN 160* 80* 103* 46* 57*  CREATININE 1.78* 0.95 1.10* 1.19* 1.63*  CALCIUM 8.3* 8.7* 10.0 9.8 10.1  PHOS  --  4.1 5.3*  --   --    CBC Recent Labs  Lab 11/17/20 0601 11/19/20 1100  WBC 8.9 10.4  HGB 8.3* 8.6*  HCT 28.1* 29.8*  MCV 90.6 91.4  PLT 311 352     Medications:    . apixaban  2.5 mg Per Tube BID  . chlorhexidine gluconate (MEDLINE KIT)  15 mL Mouth Rinse BID  . Chlorhexidine Gluconate Cloth  6 each Topical Daily  . cholecalciferol  2,000 Units Per Tube Daily  . clonazepam  0.25 mg Per Tube TID  . collagenase   Topical BID  . darbepoetin (ARANESP) injection - DIALYSIS  100 mcg Intravenous Q Tue-HD  . feeding supplement (NEPRO CARB STEADY)  237 mL Per Tube TID BM  . feeding supplement (NEPRO CARB STEADY)  840 mL Per Tube Q24H  . feeding supplement (NEPRO CARB STEADY)  980 mL Per Tube Q24H  . feeding supplement (PROSource TF)  45 mL Per Tube QID  . fentaNYL  1 patch Transdermal Q72H  . free water  30 mL Per Tube Q4H  . guaiFENesin  15 mL Per Tube Q12H  . insulin aspart  0-5 Units Subcutaneous QHS  . insulin aspart  0-9 Units Subcutaneous TID WC  . mouth rinse  15 mL Mouth Rinse QID  . metoprolol tartrate  50 mg Per Tube BID  . midodrine  40 mg Per Tube TID WC  . multivitamin  1 tablet Per Tube QHS  . nutrition supplement (  JUVEN)  1 packet Per Tube BID BM  . pantoprazole sodium  40 mg Per Tube BID  . QUEtiapine  25 mg Oral Daily  . QUEtiapine  50 mg Oral QHS  . sodium chloride flush  10-40 mL Intracatheter Q12H      Justin Mend, MD 11/22/2020, 7:50 AM

## 2020-11-22 NOTE — Progress Notes (Signed)
Physical Therapy Wound Treatment Patient Details  Name: Candace [name] MRN: [id number] Date of Birth: October 20, 1951  Today's Date: 11/22/2020 Time: 8721-5872 Time Calculation (min): 63 min  Subjective  Subjective: Pt awake and agreeable to wound care.  PO given 30 min prior to tx but patient was in more pain this session which limited debridement. Patient and Family Stated Goals: heal wound Date of Onset:  (unknown) Prior Treatments: prior hydro and Dakins  Pain Score:  4/10  Wound Assessment  Pressure Injury 09/25/20 Sacrum Bilateral;Medial Deep Tissue Pressure Injury - Purple or maroon localized area of discolored intact skin or blood-filled blister due to damage of underlying soft tissue from pressure and/or shear. Purple, non-blanchable, b (Active)  Dressing Type ABD;Barrier Film (skin prep);Normal saline moist dressing 11/20/20 1244  Dressing Clean;Dry;Intact 11/19/20 1623  Dressing Change Frequency Every other day 11/22/20 1659  State of Healing Eschar 11/22/20 1659  Site / Wound Assessment Yellow;Brown;Pink 11/22/20 1659  % Wound base Red or Granulating 10% 11/20/20 1244  % Wound base Yellow/Fibrinous Exudate 80% 11/20/20 1244  % Wound base Black/Eschar 10% 11/20/20 1244  % Wound base Other/Granulation Tissue (Comment) 0% 11/20/20 1244  Peri-wound Assessment Intact 11/22/20 1659  Wound Length (cm) 14.2 cm 11/18/20 1500  Wound Width (cm) 12 cm 11/18/20 1500  Wound Depth (cm) 5.4 cm 11/18/20 1500  Wound Surface Area (cm^2) 170.4 cm^2 11/18/20 1500  Wound Volume (cm^3) 920.16 cm^3 11/18/20 1500  Tunneling (cm) 0 11/19/20 1623  Undermining (cm) 0 11/19/20 1623  Margins Unattached edges (unapproximated) 11/22/20 1659  Drainage Amount Minimal 11/22/20 1659  Drainage Description Serous 11/22/20 1659  Treatment Debridement (Selective);Hydrotherapy (Pulse lavage) 11/22/20 1659      Hydrotherapy Pulsed lavage therapy - wound location: sacrum Pulsed Lavage with Suction (psi): 12  psi Pulsed Lavage with Suction - Normal Saline Used: 1000 mL Pulsed Lavage Tip: Tip with splash shield Selective Debridement Selective Debridement - Location: sacrum Selective Debridement - Tools Used: Forceps;Scalpel Selective Debridement - Tissue Removed: necrotic adipose, eschar, and slough at base   Wound Assessment and Plan  Wound Therapy - Assess/Plan/Recommendations Wound Therapy - Clinical Statement: Cleanse choice wound vac applied this session. Education provided to pt daughter. Pt will continue to benefit from continued hydrotherapy and for selective debridement of unviable tissue in order to decrease bioburden and promote wound bed healing. Wound Therapy - Functional Problem List: Global weakness and immobility Factors Delaying/Impairing Wound Healing: Diabetes Mellitus;Immobility;Multiple medical problems;Other (comment) (poor nutrition) Hydrotherapy Plan: Debridement;Pulsatile lavage with suction;Patient/family education;Dressing change Wound Therapy - Frequency: 6X / week Wound Therapy - Follow Up Recommendations: Skilled nursing facility Wound Plan: see above  Wound Therapy Goals- Improve the function of patient's integumentary system by progressing the wound(s) through the phases of wound healing (inflammation - proliferation - remodeling) by: Decrease Necrotic Tissue to: 50% Decrease Necrotic Tissue - Progress: Progressing toward goal Increase Granulation Tissue to: 50% Increase Granulation Tissue - Progress: Progressing toward goal Goals/treatment plan/discharge plan were made with and agreed upon by patient/family: Yes Time For Goal Achievement: 7 days Wound Therapy - Potential for Goals: Fair  Goals will be updated until maximal potential achieved or discharge criteria met.  Discharge criteria: when goals achieved, discharge from hospital, MD decision/surgical intervention, no progress towards goals, refusal/missing three consecutive treatments without notification or  medical reason.  GP    [name], PT, DPT Acute Rehabilitation Services Pager [phone] Office [phone]   [name] 11/22/2020, 5:04 PM

## 2020-11-22 NOTE — Progress Notes (Signed)
  Speech Language Pathology Treatment: Dysphagia;Passy Muir Speaking valve  Patient Details Name: Candace [name] MRN: [id number] DOB: 26-Oct-1951 Today's Date: 11/22/2020 Time: 9702-6378 SLP Time Calculation (min) (ACUTE ONLY): 33 min  Assessment / Plan / Recommendation Clinical Impression  Treatment focused on dysphagia and PMV use. Patient mildy lethargic post HD this am, but eyes open, decreased interaction with clinician but ultimately able to participate in therapy. Secretions noted to be thick this am, patient expectorating via trach but requiring full suction from SLP. Discussed possible trach downsize with MD however feels inappropriate today given thickness of secretions. MD in agreement. Valve tolerated for 30 minutes without change in vital signs or evidence of distress. Vocal quality remains significantly breathy with decreased intelligibility (approximately 25%) despite clinician verbal cueing for increased volume. Patient able to consume 4 ounces of nectar thick liquid with no overt indication of aspiration, mildly reduced oral transit and intermittent oral holding requiring verbal cues from clinician to clear. [place] team in room with clinician, removing clogged tube. Daughter also present. Daughter questioning if new tube could be held to given patient a chance to eat pos only. Given level of weakness today and performance with pos, although no as concerned about aspiration, do not feel that patient will consume enough pos to sustain self nutritionally at this time until strength improves. Discussed with daughter.  Also educated her on diet recommendations, need for valve during pos, and thickening process as patient may prefer manually thickened liquids.    HPI HPI: 70 year old female who was previously diagnosed with Covid 08/23/2020.  Admitted 11/2 and intubated with AF-RVR, found to have a perforated duodenal underwent exploratory laparotomy with Phillip Heal patch placement 11/3, Intubated for  20 days until 11/23 then trach placed. Prolonged ventilation, until 1/17. Profoundly deconditioned.      SLP Plan  Continue with current plan of care       Recommendations  Diet recommendations: Dysphagia 2 (fine chop);Nectar-thick liquid Liquids provided via: Cup;Straw Medication Administration: Crushed with puree Supervision: Staff to assist with self feeding;Full supervision/cueing for compensatory strategies Compensations: Slow rate;Small sips/bites Postural Changes and/or Swallow Maneuvers: Seated upright 90 degrees      Patient may use Passy-Muir Speech Valve: During all waking hours (remove during sleep);During PO intake/meals (with staff and SLP full supervision given secretions) PMSV Supervision: Full MD: Please consider changing trach tube to : Cuffless;Smaller size         Oral Care Recommendations: Oral care BID SLP Visit Diagnosis: Dysphagia, unspecified (R13.10);Aphonia (R49.1) Plan: Continue with current plan of care       GO               [facility] MA, CCC-SLP   [name] 11/22/2020, 12:37 PM

## 2020-11-22 NOTE — Progress Notes (Signed)
Progress Note  82 Days Post-Op  Subjective: Seen today with PT hydrotherapy.  Objective: Vital signs in last 24 hours: Temp:  [98.3 F (36.8 C)-99.1 F (37.3 C)] 98.3 F (36.8 C) (01/24 0328) Pulse Rate:  [99-132] 114 (01/24 0800) Resp:  [18-29] 20 (01/24 0800) BP: (103-131)/(69-82) 130/78 (01/24 0800) SpO2:  [96 %-100 %] 100 % (01/24 0800) FiO2 (%):  [28 %] 28 % (01/24 0328) Last BM Date: 11/21/20  Intake/Output from previous day: 01/23 0701 - 01/24 0700 In: 980 [P.O.:240; I.V.:40; NG/GT:700] Out: 800 [Urine:100; Stool:700] Intake/Output this shift: No intake/output data recorded.  PE: Gen:  Alert, NAD HEENT: trach in place with PMV Pulm:  rate and effort normal Sacral wound: large sacral wound with about 85% nonviable tissue and necrotic tissue around the edges, small amount of healthy granulation tissue, no purulent drainage or cellulitis, no significant tunneling      Lab Results:  Recent Labs    11/19/20 1100 11/22/20 0640  WBC 10.4 9.2  HGB 8.6* 9.5*  HCT 29.8* 31.6*  PLT 352 365   BMET Recent Labs    11/21/20 0217 11/22/20 0640  NA 133* 135  K 4.4 3.4*  CL 97* 98  CO2 24 22  GLUCOSE 187* 114*  BUN 57* 61*  CREATININE 1.63* 1.59*  CALCIUM 10.1 10.5*   PT/INR No results for input(s): LABPROT, INR in the last 72 hours. CMP     Component Value Date/Time   NA 135 11/22/2020 0640   K 3.4 (L) 11/22/2020 0640   CL 98 11/22/2020 0640   CO2 22 11/22/2020 0640   GLUCOSE 114 (H) 11/22/2020 0640   BUN 61 (H) 11/22/2020 0640   CREATININE 1.59 (H) 11/22/2020 0640   CALCIUM 10.5 (H) 11/22/2020 0640   PROT 8.0 11/12/2020 0124   ALBUMIN 2.5 (L) 11/22/2020 0640   AST 26 11/12/2020 0124   ALT 19 11/12/2020 0124   ALKPHOS 156 (H) 11/12/2020 0124   BILITOT 0.5 11/12/2020 0124   GFRNONAA 35 (L) 11/22/2020 0640   Lipase     Component Value Date/Time   LIPASE 48 08/31/2020 2054       Studies/Results: No results  found.  Anti-infectives: Anti-infectives (From admission, onward)   Start     Dose/Rate Route Frequency Ordered Stop   11/17/20 1415  fluconazole (DIFLUCAN) 40 MG/ML suspension 200 mg        200 mg Oral  Once 11/17/20 1315 11/17/20 1357   11/03/20 2200  meropenem (MERREM) 500 mg in sodium chloride 0.9 % 100 mL IVPB        500 mg 200 mL/hr over 30 Minutes Intravenous Every 24 hours 11/03/20 1500 11/06/20 2217   11/01/20 1000  anidulafungin (ERAXIS) 100 mg in sodium chloride 0.9 % 100 mL IVPB  Status:  Discontinued       "Followed by" Linked Group Details   100 mg 78 mL/hr over 100 Minutes Intravenous Every 24 hours 10/31/20 0916 11/01/20 1404   10/31/20 1015  meropenem (MERREM) 1 g in sodium chloride 0.9 % 100 mL IVPB  Status:  Discontinued        1 g 200 mL/hr over 30 Minutes Intravenous Every 8 hours 10/31/20 0916 11/03/20 1500   10/31/20 1015  linezolid (ZYVOX) IVPB 600 mg  Status:  Discontinued        600 mg 300 mL/hr over 60 Minutes Intravenous Every 12 hours 10/31/20 0916 11/02/20 0906   10/31/20 1015  anidulafungin (ERAXIS) 200 mg in sodium chloride  0.9 % 200 mL IVPB       "Followed by" Linked Group Details   200 mg 78 mL/hr over 200 Minutes Intravenous  Once 10/31/20 0916 10/31/20 1630   10/25/20 1800  ceFAZolin (ANCEF) IVPB 2g/100 mL premix  Status:  Discontinued        2 g 200 mL/hr over 30 Minutes Intravenous Every 12 hours 10/25/20 1022 10/30/20 1103   10/23/20 2000  vancomycin (VANCOREADY) IVPB 750 mg/150 mL  Status:  Discontinued        750 mg 150 mL/hr over 60 Minutes Intravenous Every 24 hours 10/22/20 2036 10/25/20 1022   10/23/20 1800  piperacillin-tazobactam (ZOSYN) IVPB 3.375 g  Status:  Discontinued        3.375 g 100 mL/hr over 30 Minutes Intravenous Every 6 hours 10/23/20 1155 10/24/20 1422   10/23/20 0200  piperacillin-tazobactam (ZOSYN) IVPB 3.375 g  Status:  Discontinued        3.375 g 100 mL/hr over 30 Minutes Intravenous Every 8 hours 10/22/20 2036  10/23/20 1155   10/22/20 1630  vancomycin (VANCOREADY) IVPB 1500 mg/300 mL        1,500 mg 150 mL/hr over 120 Minutes Intravenous  Once 10/22/20 1537 10/22/20 2229   10/22/20 1630  piperacillin-tazobactam (ZOSYN) IVPB 2.25 g  Status:  Discontinued        2.25 g 100 mL/hr over 30 Minutes Intravenous Every 8 hours 10/22/20 1537 10/22/20 2036   10/22/20 1537  vancomycin variable dose per unstable renal function (pharmacist dosing)  Status:  Discontinued         Does not apply See admin instructions 10/22/20 1537 10/22/20 2036   10/19/20 0600  ceFAZolin (ANCEF) IVPB 2g/100 mL premix        2 g 200 mL/hr over 30 Minutes Intravenous To Radiology 10/18/20 1457 10/19/20 0938   10/10/20 0830  vancomycin (VANCOCIN) IVPB 1000 mg/200 mL premix       "Followed by" Linked Group Details   1,000 mg 200 mL/hr over 60 Minutes Intravenous Every 24 hours 10/09/20 0744 10/15/20 0902   10/09/20 0830  piperacillin-tazobactam (ZOSYN) IVPB 3.375 g        3.375 g 100 mL/hr over 30 Minutes Intravenous Every 6 hours 10/09/20 0744 10/16/20 0038   10/09/20 0830  vancomycin (VANCOREADY) IVPB 2000 mg/400 mL       "Followed by" Linked Group Details   2,000 mg 200 mL/hr over 120 Minutes Intravenous  Once 10/09/20 0744 10/09/20 1022   09/24/20 1000  ceFAZolin (ANCEF) IVPB 2g/100 mL premix  Status:  Discontinued        2 g 200 mL/hr over 30 Minutes Intravenous Every 12 hours 09/24/20 0801 09/24/20 1046   09/23/20 1400  vancomycin (VANCOCIN) IVPB 1000 mg/200 mL premix        1,000 mg 200 mL/hr over 60 Minutes Intravenous Every 24 hours 09/22/20 1436 09/28/20 1718   09/22/20 2200  ceFEPIme (MAXIPIME) 2 g in sodium chloride 0.9 % 100 mL IVPB  Status:  Discontinued        2 g 200 mL/hr over 30 Minutes Intravenous Every 12 hours 09/22/20 1436 09/24/20 0801   09/22/20 1030  ceFEPIme (MAXIPIME) 1 g in sodium chloride 0.9 % 100 mL IVPB        1 g 200 mL/hr over 30 Minutes Intravenous  Once 09/22/20 0934 09/22/20 1145    09/22/20 1015  vancomycin (VANCOCIN) IVPB 1000 mg/200 mL premix        1,000 mg 200  mL/hr over 60 Minutes Intravenous  Once 09/22/20 0934 09/22/20 1446   09/12/20 2200  ceFEPIme (MAXIPIME) 2 g in sodium chloride 0.9 % 100 mL IVPB        2 g 200 mL/hr over 30 Minutes Intravenous Every 12 hours 09/12/20 0732 09/14/20 2134   09/11/20 1400  ceFEPIme (MAXIPIME) 2 g in sodium chloride 0.9 % 100 mL IVPB  Status:  Discontinued        2 g 200 mL/hr over 30 Minutes Intravenous Every 8 hours 09/11/20 1126 09/12/20 0732   09/08/20 1000  vancomycin (VANCOREADY) IVPB 2000 mg/400 mL        2,000 mg 200 mL/hr over 120 Minutes Intravenous  Once 09/08/20 0857 09/08/20 1224   09/08/20 1000  ceFEPIme (MAXIPIME) 2 g in sodium chloride 0.9 % 100 mL IVPB  Status:  Discontinued        2 g 200 mL/hr over 30 Minutes Intravenous Every 12 hours 09/08/20 0857 09/11/20 1126   09/08/20 0856  vancomycin variable dose per unstable renal function (pharmacist dosing)  Status:  Discontinued         Does not apply See admin instructions 09/08/20 0857 09/09/20 0935   09/02/20 1600  cefTRIAXone (ROCEPHIN) 1 g in sodium chloride 0.9 % 100 mL IVPB  Status:  Discontinued        1 g 200 mL/hr over 30 Minutes Intravenous Every 24 hours 09/01/20 1811 09/02/20 0838   09/01/20 1800  fluconazole (DIFLUCAN) IVPB 400 mg        400 mg 50 mL/hr over 240 Minutes Intravenous  Once 09/01/20 1749 09/02/20 0603   09/01/20 1530  piperacillin-tazobactam (ZOSYN) IVPB 3.375 g        3.375 g 12.5 mL/hr over 240 Minutes Intravenous Every 8 hours 09/01/20 1514 09/05/20 2111   09/01/20 1000  levofloxacin (LEVAQUIN) tablet 250 mg  Status:  Discontinued        250 mg Oral Daily 08/31/20 1508 08/31/20 1735   08/31/20 1730  cefTRIAXone (ROCEPHIN) 1 g in sodium chloride 0.9 % 100 mL IVPB  Status:  Discontinued        1 g 200 mL/hr over 30 Minutes Intravenous Every 24 hours 08/31/20 1726 09/01/20 1513   08/31/20 1730  azithromycin (ZITHROMAX) 500 mg in  sodium chloride 0.9 % 250 mL IVPB  Status:  Discontinued        500 mg 250 mL/hr over 60 Minutes Intravenous Every 24 hours 08/31/20 1726 09/02/20 0838       Assessment/Plan HTN HLD DM Hx colon cancer s/p partial colectomy/colostomy followed by completion colectomy and ileostomy Acute respiratory failure due to COVID-19 pneumonia: s/p tracheostomy, Multiple bacterial pneumonias A fib RVR - now on eliquis LE DVT - on eliquis 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 Would not recommend taking this patient to the OR for general anesthesia and surgical debridement at this time. Tissue is nonviable but she has no signs of infection. Veraflo wound vac to be applied today, changes MWF.  Pneumoperitoneum,perforated duodenal ulcer S/pexploratory laparotomy, adhesiolysis, modified graham patch repair of perforated duodenal ulcer11/3 Dr. Bobbye Morton -POD#82 -tolerating TF - BID wet to dry dressing changes to midline abdominal wound  ID - currently none FEN -  DYS 2 diet and bolus TF to supplement  VTE - eliquis Foley - none Follow up - Dr. Bobbye Morton   LOS: 82 days    Norm Parcel , [facility] 11/22/2020, 10:47 AM Please see Amion for pager number during day hours 7:00am-4:30pm

## 2020-11-22 NOTE — Progress Notes (Signed)
Physical Therapy Treatment Patient Details Name: Candace [name] MRN: [id number] DOB: December 09, 1950 Today's Date: 11/22/2020    History of Present Illness 69 y.o. female with medical history significant for recent covid pna, obesity, colon cancer s/p colon resection with colostomy bag, HLD, NIDDM2, CKD3 presented to ED after her follow-up nurse advised her to present to ED for elevated HR. +afib, elevated troponins with demand ischemia,  CT scan is positive for bowel perforation with pneumomediastinum 11/03 exp lap with repair of perforated ulcer. 11/10 t/f to ICU- intubated/sedated/proned. Trach placed 11/24. CRRT off 12/8, restarted 12/10-1/5.    PT Comments    Pt was seen for mobility on bed with PROM to AAROM to LE's, replaced the Prevalon boots on her feet and got pt repositioned on the bed.  Pt is lethargic after HD today, and daughter is present to ask whether PT could choose a time to be there.  Talked with daughter about the model for acute therapy, and that PT did know what days are HD.  Follow pt for progressing to OOB in chair for short times as her wound will allow.  Replace Prevalon boots when PT arrives.   Follow Up Recommendations  SNF;LTACH     Equipment Recommendations  Wheelchair (measurements PT);Wheelchair cushion (measurements PT);Hospital bed;Other (comment)    Recommendations for Other Services OT consult     Precautions / Restrictions Precautions Precautions: Fall Precaution Comments: baseline colostomy; sacral wound Restrictions Weight Bearing Restrictions: No    Mobility  Bed Mobility Overal bed mobility: Needs Assistance Bed Mobility: Supine to Sit;Sit to Supine Rolling: Max assist   Supine to sit: Max assist Sit to supine: Max assist   General bed mobility comments: pt is assisted to move both arms and legs, not initiating today  Transfers Overall transfer level: Needs assistance                  Ambulation/Gait                  Stairs             Wheelchair Mobility    Modified Rankin (Stroke Patients Only)       Balance Overall balance assessment: Needs assistance Sitting-balance support: Bilateral upper extremity supported;Feet unsupported Sitting balance-Leahy Scale: Zero Sitting balance - Comments: unable to sit up fully due to lethargy                                    Cognition Arousal/Alertness: Lethargic Behavior During Therapy: Flat affect Overall Cognitive Status: Difficult to assess Area of Impairment: Problem solving;Awareness;Safety/judgement;Following commands;Memory;Attention                 Orientation Level: Situation Current Attention Level: Selective Memory: Decreased short-term memory Following Commands: Follows one step commands with increased time Safety/Judgement: Decreased awareness of safety;Decreased awareness of deficits Awareness: Intellectual Problem Solving: Slow processing;Requires verbal cues;Requires tactile cues General Comments:  (lethargic, opens eyes minimally)      Exercises General Exercises - Lower Extremity Ankle Circles/Pumps: AROM Heel Slides: AAROM;10 reps Hip ABduction/ADduction: AAROM Hip Flexion/Marching: AAROM;PROM    General Comments General comments (skin integrity, edema, etc.): Pt is up to do ROM and sitting propped on bed in center at end of sessoin      Pertinent Vitals/Pain Pain Assessment: No/denies pain Faces Pain Scale: Hurts little more Pain Location: R knee and ankles with ROM Pain Descriptors / Indicators: Grimacing  Pain Intervention(s):  (Repositioned and placed in prevalon boots)    Home Living                      Prior Function            PT Goals (current goals can now be found in the care plan section) Acute Rehab PT Goals Patient Stated Goal: daughter states she is concerned about strengthening    Frequency    Min 2X/week      PT Plan Current plan remains  appropriate    Co-evaluation              AM-PAC PT "6 Clicks" Mobility   Outcome Measure  Help needed turning from your back to your side while in a flat bed without using bedrails?: A Lot Help needed moving from lying on your back to sitting on the side of a flat bed without using bedrails?: A Lot Help needed moving to and from a bed to a chair (including a wheelchair)?: Total Help needed standing up from a chair using your arms (e.g., wheelchair or bedside chair)?: Total Help needed to walk in hospital room?: Total Help needed climbing 3-5 steps with a railing? : Total 6 Click Score: 8    End of Session Equipment Utilized During Treatment: Oxygen Activity Tolerance: Patient tolerated treatment well Patient left: in bed;with call bell/phone within reach;with bed alarm set;with family/visitor present Nurse Communication: Mobility status;Need for lift equipment PT Visit Diagnosis: Muscle weakness (generalized) (M62.81);Difficulty in walking, not elsewhere classified (R26.2);Pain;Adult, failure to thrive (R62.7);Unsteadiness on feet (R26.81) Pain - Right/Left: Right Pain - part of body: Knee     Time: 7903-8333 PT Time Calculation (min) (ACUTE ONLY): 31 min  Charges:  $Therapeutic Exercise: 8-22 mins $Therapeutic Activity: 8-22 mins        Ramond Dial 11/22/2020, 5:04 PM  Mee Hives, PT MS Acute Rehab Dept. Number: [place] and [place]

## 2020-11-22 NOTE — Progress Notes (Signed)
Nutrition Follow-up  DOCUMENTATION CODES:   Not applicable  INTERVENTION:   Continue 48 hour calorie count  Nepro Shake po TID, each supplement provides 425 kcal and 19 grams protein  74m Prosource Plus po TID, each supplement provides 100 kcals and 15 grams of protein  Continue Magic cup TID with meals, each supplement provides 290 kcal and 9 grams of protein  Continue double protein portions with all meals  Continue Rena-vit daily   NUTRITION DIAGNOSIS:   Increased nutrient needs related to wound healing as evidenced by estimated needs.  ongoing  GOAL:   Patient will meet greater than or equal to 90% of their needs  Not met  MONITOR:   PO intake,Supplement acceptance,Labs,Weight trends,TF tolerance,Skin,I & O's  REASON FOR ASSESSMENT:   Consult New TPN/TNA  ASSESSMENT:   70year old female patient with h/o diabetes, stage III chronic kidney disease, atrial fibrillation, hypertension, dyslipidemia and colon cancer s/p colectomy and colostomy who was admitted with COVID on 08/23/20 along with acute kidney injury and hypoxemic respiratory failure.  Pt was discharged home on 2 to 3 L of oxygen but returned to the ER on 08/31/2020 with atrial fibrillation with RVR and hemorrhagic shock r/t perforated duodenal ulcer now s/p exploratory laparotomy on 11/3 with adhesiolysis and modified graham patch.   Pt was transitioned to nocturnal TF on 1/22. Current regimen via Cortrak: Nepro @ 627mhr x 14 hours overnight with 4560mrosource TF QID and 10m79mee water Q4H. Pt also receiving Juven BID via tube.   Calorie count was ordered to be conducted over the weekend; however, no meal tickets were collected by RN sConservation officer, historic buildingsly 2 meal completions documented in pt's chart over the last 2 months (on 1/21 and 1/23), both occurring during the time range the calorie count was supposed to be conducted. Both meals charted as 10% completion. Pt has orders for Nepro shakes po as well, but  refused supplement per RN documentation.  Today, RN informed RD that Cortrak became clogged. Unclogging protocol was unsuccessful. Cortrak RD attempted to unclog tube but was also unsuccessful, so Cortrak was removed. Pt's daughter then inquired about leaving the Cortrak out and assessing how the pt does with po intake over the next 2 days. Per pt's daughter, the pt did not eat well over the weekend due to a medication making her lethargic. Daughter reports pt began eating last night -- pt had 5 bites of mashed potatoes and [place] together, a [place], a few bites of pureed pears, and a few bites of pureed carrot. Discussed how this is inadequate to meet pt's elevated protein/kcal needs and also discussed why pt's needs are so high and importance of adequate intake. Allowed pt to try butter pecan Nepro shake and found that pt really enjoys this supplement. Pt/family agreeable to aiming for 3 Nepro shakes daily. Pt also agreed to drink Prosource at the time of every meal and states that she enjoys the orange magic cup. Pt's daughter is also likely to bring food from home to improve pt's acceptance/intake; this has been approved by SLP. Given the multiple sources of protein the pt is willing to try, decision was made to hold off on replacing the Cortrak and to continue the calorie count over the next 48 hours to see if pt's intake improves. If pt still not meeting needs, will readdress need for Cortrak on Wednesday. Discussed plan with RN.   Admission wt: 99.7 kg  Current wt: 85-86 kg (4 wts taken today,  3 ranging from 85-86 kg and 1 listed as 92.6kg, suspect this is an inaccurate reading) Last HD 1/24, net UF 2L  UOP: 170m documented x24 hours  Colostomy: 7020mdocumented x24 hours  Labs: K+ 3.4 (L), PO4 7.0 (H), Corrected Ca 11.7 (H), CBGs 91-158 Medications: vitamin D3, aranesp, ss novolog TID w/ meals and at bedtime, rena-vit  Diet Order:   Diet Order            DIET DYS 2 Room  service appropriate? No; Fluid consistency: Nectar Thick  Diet effective now                 EDUCATION NEEDS:   Not appropriate for education at this time  Skin:  Skin Assessment: Skin Integrity Issues: Skin Integrity Issues:: Stage II DTI: sacrum Stage I: n/a Stage II: ear Incisions: abdomen  Last BM:  1/23 type 7 - 70035mocumented via colostomy  Height:   Ht Readings from Last 1 Encounters:  09/20/20 _0  (1.727 m)    Weight:   Wt Readings from Last 1 Encounters:  08/23/20 99.8 kg   BMI:  Body mass index is 31.04 kg/m.  Estimated Nutritional Needs:   Kcal:  2000-2300kcal/day  Protein:  100-120g/day  Fluid:  UOP +1L   AmaLarkin InaS, RD, LDN RD pager number and weekend/on-call pager number located in [place]

## 2020-11-22 NOTE — Plan of Care (Signed)
Pt refused to take her meds tonight despite me educating her on the importance of them. Spoke with daughter earlier before she left unit and she did say pt had dialysis today and was pretty wore out and on these days she may be a Ramesha Poster resistant to taking her meds. Vitals are stable with the last BP being 111/66, oxygen at 100% on trach collar, and HR 102 at 2304. Pt did let me change her dressing though. I will call and update daughter. Will continue to monitor.

## 2020-11-22 NOTE — TOC Benefit Eligibility Note (Signed)
Transition of Care [facility]) Benefit Eligibility Note    Patient Details  Name: Candace [name] MRN: [id number] Date of Birth: 06-Aug-1951   Medication/Dose: Arne Cleveland  2.5 MG BID  AND  ELIQUIS  5 MG BID  Covered?: Yes  Tier: 3 Drug  Prescription Coverage [facility]: [facility]  Spoke with Person/Company/Phone Number:: JOSEL @  [employer] # [phone]  Co-Pay: $47.00  Prior Approval: No  Deductible: Met       Memory Argue Phone Number: 11/22/2020, 10:51 AM

## 2020-11-22 NOTE — Progress Notes (Addendum)
[facility] PROGRESS NOTE  Candace [name] MVH:[id number] DOB: 04-22-1951 DOA: 08/31/2020 PCP: Candace Greenhouse, MD       Status: Remains inpatient appropriate because:Ongoing diagnostic testing needed not appropriate for outpatient work up, Unsafe d/c plan, IV treatments appropriate due to intensity of illness or inability to take PO and Inpatient level of care appropriate due to severity of illness   Dispo: The patient is from: Home              Anticipated d/c is to: SNF recommended-family wishes to take home- will explore LTAC option              Anticipated d/c date is: > 3 days              Patient currently is not medically stable to d/c.  Barriers to discharge: Patient continues to have persistent tachycardia likely related to severe physical deconditioning as well as ongoing tachypnea likely related to persistent metabolic acidemia from kidney disease, continues to require intermittent hemodialysis and is not physically able to/pivot to transfer to wheelchair to proceed with outpatient hemodialysis.  Code Status: Full Family Communication: Previous attendings have spoken to family regarding CODE STATUS (including intervention by ethics team) and they maintained desire to proceed with aggressive treatments and full CODE STATUS DVT prophylaxis: Eliquis Vaccination status: Has not been vaccinated but did have severe COVID infection initially diagnosed on 08/23/2020.  Patient will be eligible for COVID-vaccine after 11/23/2020  **1/24 spoke at length with patient's daughter Candace [name].  Updated her on her mom's condition.  She states that core track has just now been removed by nutrition team due to unable to declog.  They are monitoring her for the next few days to determine if she can maintain adequate caloric intake without tube if not tube will be replaced.  Asked her if patient were a candidate which she want a PEG tube placed since she is focused on taking patient home sooner than later.   She reports yes if this can be accomplished.  Emphasized that patient remains very very deconditioned and I understand they do not want her to go to skilled nursing facility but that it would be better for the patient to maintain hospitalization status to ensure a minimum of 2 to 3 days/week of physical therapy.  Also discussed the possibility of patient still requiring outpatient dialysis.  They are interested in pursuing home dialysis.  TOC aware and we will also contact renal navigator to determine if this is a financial alternative for this family.  Based on my conversation with daughter it appears that once these "loose ends" can be tied up they are interested and getting patient home sooner rather than later**  Foley catheter: No, purewick female urinary collection device   HPI: 70 year old female patient with prior history of diabetes, stage III chronic kidney disease, atrial fibrillation, hypertension, dyslipidemia and colon cancer that is post colectomy and colostomy.  Initially diagnosed with COVID on 08/23/20 she was admitted to the hospital due to dehydration, acute kidney injury and hypoxemic respiratory failure.  She was discharged home on 2 to 3 L of oxygen after being treated with IV steroids and remdesivir.  During that time although she had elevated inflammatory markers she had no embolic or thrombotic disease.  She had been on prophylactic dose heparin during the hospitalization and was placed on Eliquis for 2 weeks for after discharge  Patient returned to the ER on 08/31/2020 for complaints of not feeling well.  She was found to be in atrial fibrillation with RVR, hemorrhagic shock and work-up revealed a perforated duodenal ulcer.  She underwent exploratory laparotomy on 11/3.  She has had an extensive ICU stay with significant events as below.   11/2 Admitted with rapid Afib 11/3 OR with findings of perforated duodenal ulcer 11/10 progressive hemorrhagic shock, intubated,  transfused, pressors, proned; started on CRRT in PM 11/16 Extubated. Re-intubated overnight due to respiratory distress and hypoxia with decreased mentation 11/18 Bronch, cultures sent 11/19 Hgb down getting blood 11/24 Spiked fever, resume empirical antimicrobial therapy 11/26 Hemorrhagic shock, hgb 5.6, increased pressors,  11/30 Per palliative "[name] understanding that patient is unfortunately very fragiledespite ongoing intensive medical treatment and full mechanical support. [name] that the familywantsto continue with all current interventions despite potential outcomes". 12/08 CRRT discontinued due to clotting 12/09 Family requested transfer to tertiary care [facility]). [facility] denied transfer  12/10 CRRT restarted. Episodes of tachycardia, tachypnea that seem to improve with pain management 12/11 Back in shock. Pressor requirements up. CXR worse. ABX resumed 12/12 Still requiring inc pressors. Had hypoglycemic event. Basal insulin dosing adjusted 12/13 Pressor requirements better. Now hyperglycemic. Re-adjusted Glycemic control  12/19 Afebrile . Remains on dilaudid and heparin gtt, dilaudid gtt increased overnight for concern of pain / ongoing tachycardia, no other events . NE and precedex off 12/17. Ongoing CRRT- even UF, dosing lokelmia this morning 12/20 On CRRT. Renal plans for HD cath and moving to HD. Getting wound care 12/24 - renal stopping CRRT today and plans HD 10/24/20 . 40% fio2 on vent via Trach. TAchypenic and tachycardic. Afebrile but wbc up to 27.6K. On TF. On heparin gtt 12/25 - Back on CRRT. On vent via trach at 40% fio2, On scheduled dilaudid as add on to oxy. Per family request 12/24 - they felt scheduled oxy was not adequate and patient was showing signs of opioid withdrawal.  Patient also had worsening SIRS/sepsis syndrome. Had fever, rising wbc, worsening RR 40-60 and HR 140s sinus->started On abx yesterday. Fever some better today. WBC plateau at  28,.5K. On new levophed ->signifanct improvement in HR 77 and RR t0 20. On heparin gtt. On precedex gtt. On levophed gtt 28mg wthi midodrine. On TF 12/30 Remains on CRRT with intermittent pressor requirements. Ethics consult placed 12/29. Ethics rec time trial of CRRT 12/31 failed SBT with RR 40s. Several conversations between care tam and family, who are upset RE plan of care 1/1 back on pressors  1/4: On pressors, keeping even on CVVHD, HGB drop to 6.9, transfused 1 unit. Improving mental status 1/10 remains on low dose levophed 240m 1/11 off levo, attempting HD with UF for first time. Now tolerating intermittent HD. 1/19 patient transferred to progressive bed with tracheostomy on 35% FiO2.  has NG tube feeding.  No PEG tube.  Received dialysis on 1/18. 1/24 significant improvement and persistent tachycardia after introduction of twice daily beta-blocker.  Heart rates have improved from the 130s to the 98-104 range  Subjective: Patient alert and awake.  Examined in dialysis.  Continues to have very soft-spoken speech which at times is difficult to understand.  No specific complaints reported by patient  Objective: Vitals:   11/22/20 0316 11/22/20 0328  BP:  105/72  Pulse:    Resp:  20  Temp:  98.3 F (36.8 C)  SpO2: 99%     Intake/Output Summary (Last 24 hours) at 11/22/2020 0750 Last data filed at 11/22/2020 0659 Gross per 24 hour  Intake 980 ml  Output  800 ml  Net 180 ml   Filed Weights   11/16/20 1500 11/16/20 1802 11/16/20 2120  Weight: 86 kg 85 kg 92.6 kg    Exam: Constitutional: Alert, no acute distress, appears to be tolerating dialysis well Respiratory: Bilateral lung sounds coarse but mostly clear to auscultation somewhat diminished in the bases, trach collar in place, FiO2 28%.  No significant secretions from trach detected Cardiovascular: S1-S2 without rubs murmurs thrills or gallops, pulse is regular with intermittent tachycardia but much improved over last week  with ventricular rates between 98 and 104 Abdomen:Core track tube in place for tube feedings.  On D2 diet with nectar thick liquids. LBM 1/12 Skin: no rashes, lesions, ulcers. No induration.  Patient does have massive decubitus ulcer Neurologic: CN 2-12 grossly intact. Sensation intact, DTR normal. Strength 2/5 x all 4 extremities.  Psychiatric: Normal judgment and insight. Alert and oriented x 3. Normal mood.    Assessment/Plan: Acute problems: PAF maintaining sinus rhythm w/persistent tachycardia -Currently maintaining sinus rhythm with tachycardic rates.  Ventricular response have been in the 90 to low 100 ranges and had increased up into the 120s during dialysis. -Continue metoprolol as BP tolerates-significant tachycardia improved with rates in the high 90s to low 100s now -Formal cardiology consult requested -Given persistent tachycardia will repeat echocardiogram to rule out tachycardia induced cardiomyopathy.  Last echo in November 2021 with hyperdynamic EF 70-75% with mild LVH and mild RV systolic dysfunction; repeat echocardiogram this admission with an EF 50 to 55% with mild diastolic dysfunction parameters and normal RV systolic function -Continue Eliquis-benefits coordinator has determined that Eliquis is covered at [facility] in [place] with a co-pay of $47  Acute respiratory failure secondary to COVID-pneumonia/tracheostomy -Patient stable on low-flow oxygen FiO2 28% -PCCM/trach team following -Continues to have excessive secretions so we will continue aggressive suctioning and chest PT-Vibra vest -Continue PMV training noting patient has passed a modified barium swallow evaluation and diet has been advanced  Acute kidney injury secondary to COVID-related sepsis with shock superimposed on stage III chronic kidney disease -Continue intermittent hemodialysis as directed by the renal team-next due on 1/24 (today)-total ultrafiltration on last dialysis was 1.5 L -Avoid nephrotoxic  medications -Continue Aranesp on dialysis days  Dysphagia/moderate to severe protein calorie malnutrition Nutrition Status: Nutrition Problem: Increased nutrient needs Etiology: wound healing Signs/Symptoms: estimated needs Interventions: Refer to RD note for recommendations  Estimated body mass index is 31.04 kg/m as calculated from the following:   Height as of this encounter: 5' 8"  (1.727 m).   Weight as of this encounter: 92.6 kg.  -Begin calorie count-upon review of documentation of intake patient is only eating about 10% of meals-core track became clogged and has been removed by nutrition team.  At this time we are not replacing and we are monitoring to see if patient will maintain adequate caloric intake.  She has now been switched to D2 nectar thick diet.  Acute hemorrhagic shock secondary to perforated duodenal ulcer/anemia of critical illness -Stable postoperatively -Has tolerated tube feedings and oral diet without any evidence of postoperative obstructive issues which can be common after duodenal oversewing with subsequent scar tissue formation -Hemoglobin stable today 8.6 Since admission  Diabetes mellitus 2 -CBGs down in the low 90s now that off tube feedings therefore will discontinue  low-dose Lantus and meal coverage but will continue with sliding scale insulin -CBGs well controlled -HgbA1c 08/24/2020 and was 7.0 -Prior to admission patient was on metformin XR  Profound physical deconditioning/acute encephalopathy secondary to  prolonged hospital stay/orthostasis -SNF recommended but family wants to take patient home -At this juncture patient unable to sit up in bed independently much less stand pivot and rotate to get into wheelchair and would be significant fall risk and care risk at home -In addition patient would need to be able to get in wheelchair to transport to dialysis if dialysis were to be continued in the outpatient setting -Evaluating for possible LTAC  placement -Patient oriented to name and place at this juncture.  Continue Seroquel and Klonopin for issues related to acute ICU delirium-hopeful can taper and discontinue especially if contributes to sedation -Continue high-dose midodrine 3 times daily -Persistent tachycardia secondary to severe deconditioning has improved with the introduction of beta-blockers  Stage IV sacral decubitus -Not present on admission -Wound care nurse following and utilizing Santyl and hydrotherapy -Surgical team consulted.  Current recommendation is against pursuing general anesthesia to undergo extensive surgical procedure-hopefully can be reevaluated in the future if wound does not heal adequately -Kreg rotational bed  -Continue Duragesic patch for pain.  Discontinue IV Dilaudid in favor of immediate release oxycodone for breakthrough pain Incision (Closed) 09/01/20 Abdomen (Active)  Date First Assessed/Time First Assessed: 09/01/20 1737   Location: Abdomen    Assessments 09/01/2020  6:25 PM 11/21/2020  7:47 PM  Dressing Type Gauze (Comment) --  Site / Wound Assessment Dressing in place / Unable to assess --  Drainage Amount None --  Treatment -- Other (Comment)     No Linked orders to display     Pressure Injury 09/17/20 Ear Left;Anterior;Posterior Stage 2 -  Partial thickness loss of dermis presenting as a shallow open injury with a red, pink wound bed without slough. (Active)  Date First Assessed/Time First Assessed: 09/17/20 0800   Location: Ear  Location Orientation: Left;Anterior;Posterior  Staging: Stage 2 -  Partial thickness loss of dermis presenting as a shallow open injury with a red, pink wound bed without slough. ...    Assessments 09/17/2020  8:00 AM 11/21/2020  7:47 PM  Dressing Type Foam - Lift dressing to assess site every shift None  Dressing Clean;Dry;Intact --  Dressing Change Frequency Every 3 days --  Site / Wound Assessment Dry;Pink --  Peri-wound Assessment Intact --  Wound Length  (cm) 2 cm --  Wound Width (cm) 1 cm --  Wound Depth (cm) 0.25 cm --  Wound Surface Area (cm^2) 2 cm^2 --  Wound Volume (cm^3) 0.5 cm^3 --  Margins Unattached edges (unapproximated) --  Drainage Amount Scant --  Drainage Description Serosanguineous --  Treatment Cleansed --     No Linked orders to display     Pressure Injury 09/25/20 Sacrum Bilateral;Medial Deep Tissue Pressure Injury - Purple or maroon localized area of discolored intact skin or blood-filled blister due to damage of underlying soft tissue from pressure and/or shear. Purple, non-blanchable, b (Active)  Date First Assessed/Time First Assessed: 09/25/20 2000   Location: Sacrum  Location Orientation: Bilateral;Medial  Staging: Deep Tissue Pressure Injury - Purple or maroon localized area of discolored intact skin or blood-filled blister due to damage o...    Assessments 09/25/2020  5:00 PM 11/21/2020  7:47 PM  Dressing Type Foam - Lift dressing to assess site every shift --  Dressing Changed;Clean;Dry;Intact --  Dressing Change Frequency -- Daily     No Linked orders to display    Hypomagnesemia -Continue oral magnesium -1/21 give 4 g magnesium IV x1 -Follow labs  Other problems: History of stage IV colon cancer -Old  colostomy  DVT bilateral posterior tibial veins -Was not present during initial COVID admission -Continue Eliquis for now given severe debility and increased risk for developing recurrent DVT  Hypertension -Having issues with orthostasis requiring midodrine Prior to admission patient was on Norvasc  Dyslipidemia -Prior to admission patient was on Crestor-consider resuming soon once patient can swallow pills whole  Abnormal TSH -Nov 2021 TSH was 0.015-TSH and free T4, T3 normal this admission  Data Reviewed: Basic Metabolic Panel: Recent Labs  Lab 11/16/20 0456 11/17/20 0601 11/19/20 0627 11/20/20 0629 11/21/20 0217  NA 142 138 144 131* 133*  K 3.4* 3.6 4.4 4.0 4.4  CL 109 104 107 96*  97*  CO2 17* 24 23 22 24   GLUCOSE 204* 163* 97 78 187*  BUN 160* 80* 103* 46* 57*  CREATININE 1.78* 0.95 1.10* 1.19* 1.63*  CALCIUM 8.3* 8.7* 10.0 9.8 10.1  MG 1.8 1.5* 1.5* 2.9*  --   PHOS  --  4.1 5.3*  --   --    Liver Function Tests: No results for input(s): AST, ALT, ALKPHOS, BILITOT, PROT, ALBUMIN in the last 168 hours. No results for input(s): LIPASE, AMYLASE in the last 168 hours. No results for input(s): AMMONIA in the last 168 hours. CBC: Recent Labs  Lab 11/17/20 0601 11/19/20 1100  WBC 8.9 10.4  HGB 8.3* 8.6*  HCT 28.1* 29.8*  MCV 90.6 91.4  PLT 311 352   Cardiac Enzymes: No results for input(s): CKTOTAL, CKMB, CKMBINDEX, TROPONINI in the last 168 hours. BNP (last 3 results) Recent Labs    09/07/20 0118 09/08/20 0446 09/09/20 0428  BNP 216.8* 432.5* 609.7*    ProBNP (last 3 results) No results for input(s): PROBNP in the last 8760 hours.  CBG: Recent Labs  Lab 11/21/20 0321 11/21/20 0825 11/21/20 1215 11/21/20 1633 11/21/20 1943  GLUCAP 184* 203* 160* 129* 178*    No results found for this or any previous visit (from the past 240 hour(s)).   Studies: No results found.  Scheduled Meds: . apixaban  2.5 mg Per Tube BID  . chlorhexidine gluconate (MEDLINE KIT)  15 mL Mouth Rinse BID  . Chlorhexidine Gluconate Cloth  6 each Topical Daily  . cholecalciferol  2,000 Units Per Tube Daily  . clonazepam  0.25 mg Per Tube TID  . collagenase   Topical BID  . darbepoetin (ARANESP) injection - DIALYSIS  100 mcg Intravenous Q Tue-HD  . feeding supplement (NEPRO CARB STEADY)  237 mL Per Tube TID BM  . feeding supplement (NEPRO CARB STEADY)  840 mL Per Tube Q24H  . feeding supplement (NEPRO CARB STEADY)  980 mL Per Tube Q24H  . feeding supplement (PROSource TF)  45 mL Per Tube QID  . fentaNYL  1 patch Transdermal Q72H  . free water  30 mL Per Tube Q4H  . guaiFENesin  15 mL Per Tube Q12H  . insulin aspart  0-5 Units Subcutaneous QHS  . insulin aspart   0-9 Units Subcutaneous TID WC  . mouth rinse  15 mL Mouth Rinse QID  . metoprolol tartrate  50 mg Per Tube BID  . midodrine  40 mg Per Tube TID WC  . multivitamin  1 tablet Per Tube QHS  . nutrition supplement (JUVEN)  1 packet Per Tube BID BM  . pantoprazole sodium  40 mg Per Tube BID  . QUEtiapine  25 mg Oral Daily  . QUEtiapine  50 mg Oral QHS  . sodium chloride flush  10-40 mL Intracatheter  Q12H   Continuous Infusions: . sodium chloride 5 mL/hr at 11/16/20 0935  . sodium chloride    . sodium chloride    . sodium chloride    . albumin human 25 g (11/16/20 1516)    Principal Problem:   Atrial fibrillation with RVR ([place]) Active Problems:   Acute respiratory failure due to COVID-19 [facility])   New onset a-fib (HCC)   Leukocytosis   Atrial fibrillation with rapid ventricular response (HCC)   Hypoxia   Pressure injury of skin   Acute hypoxemic respiratory failure (HCC)   ARDS (adult respiratory distress syndrome) (HCC)   Perforated duodenal ulcer ([place])   On mechanically assisted ventilation ([place])   Palliative care by specialist   Goals of care, counseling/discussion   Shock ([place])   Acute and chronic respiratory failure (acute-on-chronic) ([place])   Status post tracheostomy ([place])   ESRD (end stage renal disease) [facility])   Consultants:  Cardiology  Surgery  Nephrology  Ethics  PCCM   Procedures: R PICC 11/5 >> A line 11/9 >> out ETT 11/9 > 11/16, 11/16 >> 09/21/2020, 09/21/2020 tracheostomy>> Lt [facility] 11/9 >> R IJ trialysis >> out HD catheter 12/1 >>12/20 12/21 - 14.5 Fr, 23 cm right IJ tunneled hemodialysis catheter placement. Removal of indwelling subclavian catheter.   Antibiotics: Anti-infectives (From admission, onward)   Start     Dose/Rate Route Frequency Ordered Stop   11/17/20 1415  fluconazole (DIFLUCAN) 40 MG/ML suspension 200 mg        200 mg Oral  Once 11/17/20 1315 11/17/20 1357   11/03/20 2200  meropenem (MERREM) 500 mg in sodium chloride 0.9 %  100 mL IVPB        500 mg 200 mL/hr over 30 Minutes Intravenous Every 24 hours 11/03/20 1500 11/06/20 2217   11/01/20 1000  anidulafungin (ERAXIS) 100 mg in sodium chloride 0.9 % 100 mL IVPB  Status:  Discontinued       "Followed by" Linked Group Details   100 mg 78 mL/hr over 100 Minutes Intravenous Every 24 hours 10/31/20 0916 11/01/20 1404   10/31/20 1015  meropenem (MERREM) 1 g in sodium chloride 0.9 % 100 mL IVPB  Status:  Discontinued        1 g 200 mL/hr over 30 Minutes Intravenous Every 8 hours 10/31/20 0916 11/03/20 1500   10/31/20 1015  linezolid (ZYVOX) IVPB 600 mg  Status:  Discontinued        600 mg 300 mL/hr over 60 Minutes Intravenous Every 12 hours 10/31/20 0916 11/02/20 0906   10/31/20 1015  anidulafungin (ERAXIS) 200 mg in sodium chloride 0.9 % 200 mL IVPB       "Followed by" Linked Group Details   200 mg 78 mL/hr over 200 Minutes Intravenous  Once 10/31/20 0916 10/31/20 1630   10/25/20 1800  ceFAZolin (ANCEF) IVPB 2g/100 mL premix  Status:  Discontinued        2 g 200 mL/hr over 30 Minutes Intravenous Every 12 hours 10/25/20 1022 10/30/20 1103   10/23/20 2000  vancomycin (VANCOREADY) IVPB 750 mg/150 mL  Status:  Discontinued        750 mg 150 mL/hr over 60 Minutes Intravenous Every 24 hours 10/22/20 2036 10/25/20 1022   10/23/20 1800  piperacillin-tazobactam (ZOSYN) IVPB 3.375 g  Status:  Discontinued        3.375 g 100 mL/hr over 30 Minutes Intravenous Every 6 hours 10/23/20 1155 10/24/20 1422   10/23/20 0200  piperacillin-tazobactam (ZOSYN) IVPB 3.375 g  Status:  Discontinued        3.375 g 100 mL/hr over 30 Minutes Intravenous Every 8 hours 10/22/20 2036 10/23/20 1155   10/22/20 1630  vancomycin (VANCOREADY) IVPB 1500 mg/300 mL        1,500 mg 150 mL/hr over 120 Minutes Intravenous  Once 10/22/20 1537 10/22/20 2229   10/22/20 1630  piperacillin-tazobactam (ZOSYN) IVPB 2.25 g  Status:  Discontinued        2.25 g 100 mL/hr over 30 Minutes Intravenous Every 8  hours 10/22/20 1537 10/22/20 2036   10/22/20 1537  vancomycin variable dose per unstable renal function (pharmacist dosing)  Status:  Discontinued         Does not apply See admin instructions 10/22/20 1537 10/22/20 2036   10/19/20 0600  ceFAZolin (ANCEF) IVPB 2g/100 mL premix        2 g 200 mL/hr over 30 Minutes Intravenous To Radiology 10/18/20 1457 10/19/20 0938   10/10/20 0830  vancomycin (VANCOCIN) IVPB 1000 mg/200 mL premix       "Followed by" Linked Group Details   1,000 mg 200 mL/hr over 60 Minutes Intravenous Every 24 hours 10/09/20 0744 10/15/20 0902   10/09/20 0830  piperacillin-tazobactam (ZOSYN) IVPB 3.375 g        3.375 g 100 mL/hr over 30 Minutes Intravenous Every 6 hours 10/09/20 0744 10/16/20 0038   10/09/20 0830  vancomycin (VANCOREADY) IVPB 2000 mg/400 mL       "Followed by" Linked Group Details   2,000 mg 200 mL/hr over 120 Minutes Intravenous  Once 10/09/20 0744 10/09/20 1022   09/24/20 1000  ceFAZolin (ANCEF) IVPB 2g/100 mL premix  Status:  Discontinued        2 g 200 mL/hr over 30 Minutes Intravenous Every 12 hours 09/24/20 0801 09/24/20 1046   09/23/20 1400  vancomycin (VANCOCIN) IVPB 1000 mg/200 mL premix        1,000 mg 200 mL/hr over 60 Minutes Intravenous Every 24 hours 09/22/20 1436 09/28/20 1718   09/22/20 2200  ceFEPIme (MAXIPIME) 2 g in sodium chloride 0.9 % 100 mL IVPB  Status:  Discontinued        2 g 200 mL/hr over 30 Minutes Intravenous Every 12 hours 09/22/20 1436 09/24/20 0801   09/22/20 1030  ceFEPIme (MAXIPIME) 1 g in sodium chloride 0.9 % 100 mL IVPB        1 g 200 mL/hr over 30 Minutes Intravenous  Once 09/22/20 0934 09/22/20 1145   09/22/20 1015  vancomycin (VANCOCIN) IVPB 1000 mg/200 mL premix        1,000 mg 200 mL/hr over 60 Minutes Intravenous  Once 09/22/20 0934 09/22/20 1446   09/12/20 2200  ceFEPIme (MAXIPIME) 2 g in sodium chloride 0.9 % 100 mL IVPB        2 g 200 mL/hr over 30 Minutes Intravenous Every 12 hours 09/12/20 0732  09/14/20 2134   09/11/20 1400  ceFEPIme (MAXIPIME) 2 g in sodium chloride 0.9 % 100 mL IVPB  Status:  Discontinued        2 g 200 mL/hr over 30 Minutes Intravenous Every 8 hours 09/11/20 1126 09/12/20 0732   09/08/20 1000  vancomycin (VANCOREADY) IVPB 2000 mg/400 mL        2,000 mg 200 mL/hr over 120 Minutes Intravenous  Once 09/08/20 0857 09/08/20 1224   09/08/20 1000  ceFEPIme (MAXIPIME) 2 g in sodium chloride 0.9 % 100 mL IVPB  Status:  Discontinued  2 g 200 mL/hr over 30 Minutes Intravenous Every 12 hours 09/08/20 0857 09/11/20 1126   09/08/20 0856  vancomycin variable dose per unstable renal function (pharmacist dosing)  Status:  Discontinued         Does not apply See admin instructions 09/08/20 0857 09/09/20 0935   09/02/20 1600  cefTRIAXone (ROCEPHIN) 1 g in sodium chloride 0.9 % 100 mL IVPB  Status:  Discontinued        1 g 200 mL/hr over 30 Minutes Intravenous Every 24 hours 09/01/20 1811 09/02/20 0838   09/01/20 1800  fluconazole (DIFLUCAN) IVPB 400 mg        400 mg 50 mL/hr over 240 Minutes Intravenous  Once 09/01/20 1749 09/02/20 0603   09/01/20 1530  piperacillin-tazobactam (ZOSYN) IVPB 3.375 g        3.375 g 12.5 mL/hr over 240 Minutes Intravenous Every 8 hours 09/01/20 1514 09/05/20 2111   09/01/20 1000  levofloxacin (LEVAQUIN) tablet 250 mg  Status:  Discontinued        250 mg Oral Daily 08/31/20 1508 08/31/20 1735   08/31/20 1730  cefTRIAXone (ROCEPHIN) 1 g in sodium chloride 0.9 % 100 mL IVPB  Status:  Discontinued        1 g 200 mL/hr over 30 Minutes Intravenous Every 24 hours 08/31/20 1726 09/01/20 1513   08/31/20 1730  azithromycin (ZITHROMAX) 500 mg in sodium chloride 0.9 % 250 mL IVPB  Status:  Discontinued        500 mg 250 mL/hr over 60 Minutes Intravenous Every 24 hours 08/31/20 1726 09/02/20 0838       Time spent: 40 minutes    [name] ANP  [facility] 7 am - 330 pm/M-F for direct patient care and secure chat Please refer to Amion  for contact info 82  days

## 2020-11-22 NOTE — Progress Notes (Signed)
Cortrak Tube Team Note:  Consult received to place a Cortrak feeding tube.   Pt with Cortrak tube in place but tube is clogged. Current Cortrak tube has been in  Place since 09/10/2020/ (>60 days). Current Cortrak and bridle removed.   Long discussion with pt and pt's daughter regarding re-insertion of Cortrak tube. Pt has diet order currently. Plan after discussion with pt, pt's daughter, RN, and RD is to hold off on Cortrak insertion today. Continue 48-hour calorie count. If pt unable to meet nutritional needs, plan to re-insert Cortrak on Wednesday 1/26. Cortrak consult remains active.   If assistance needed, contact the Cortrak team at [email or web address] (password [email or web address]) on Monday, Wednesday and Friday. If after hours and replacement cannot be delayed, place a NG tube and confirm placement with an abdominal x-ray.    Kerman Passey MS, RDN, LDN, CNSC Registered Dietitian III Clinical Nutrition RD Pager and On-Call Pager Number Located in [place]

## 2020-11-22 NOTE — Progress Notes (Addendum)
NAMELai Hendriks, MRN:  [id number], DOB:  December 17, 1950, LOS: 82 ADMISSION DATE:  08/31/2020, CONSULTATION DATE:  09/07/2020 REFERRING MD:  Dr Candiss Norse, CHIEF COMPLAINT:  Acute resp failure  Brief History   70 year old female who was previously diagnosed with Covid 08/23/2020.  Admitted 11/2 with AF-RVR, found to have a perforated duodenal underwent exploratory laparotomy with Phillip Heal patch placement 11/3.  Past Medical History  Covid pneumonia Atrial fibrillation CKD stage III Diabetes mellitus Hypertension Colon cancer Hyperlipidemia  Significant Hospital Events   11/2 Admitted  11/3 OR with findings of perforated duodenal ulcer 11/10 progressive hemorrhagic shock, intubated, transfused, pressors, proned; started on CRRT in PM 11/03 Exploratory laparotomy, Phillip Heal patch, lysis of adhesion for duodenal ulceration postop day 6 11/16 Extubated. Re-intubated overnight due to respiratory distress and hypoxia with decreased mentation 11/18 Bronch, cultures sent 11/19 Hgb down getting blood 11/24 Spiked fever resume empirical antimicrobial therapy 11/26 Hemorrhagic shock, hgb 5.6, increased pressors, CT A/P  11/30 Per palliative "[name] expresses understanding that patient is unfortunately very fragile despite ongoing intensive medical treatment and full mechanical support. She indicates that the family wants to continue with all current interventions despite potential outcomes". 12/08 CRRT discontinued due to clotting 12/09 Family requested transfer to tertiary care [facility]). [facility] denied transfer  12/10 CRRT restarted. Episodes of tachycardia, tachypnea that seem to improve with pain management 12/11 Back in shock. Pressor requirements up. CXR worse. ABX resumed 12/12 Still requiring inc pressors. Had hypoglycemic event. Basal insulin dosing adjusted 12/13 Pressor requirements better. Now hyperglycemic. Re-adjusted Glycemic control  12/14 Changed dilaudid to1/2 dosing from day further. D/c  vasopressin.  Goals of care reconfirmed with daughter.  Patient continues to desire aggressive care.  Not open to discussing any other option, patient family continues to be hopeful that she will be discharged to home with full recovery in spite of multiple attempts by staff to prepare them that this is unlikely scenario 12/15 Dilaudid discontinued sending cortisol for ongoing pressor dependence 12/16 Serum cortisol <20, added stress dose steroids.  PRN Dilaudid, attempting not go back on Dilaudid infusion 12/17 Developed worsening tachycardia during the evening hours received initially IV albumin, followed by resuming IV Dilaudid with question of suboptimal pain control.  Currently looks better back on Dilaudid drip.  We have been able to wean pressors after adding stress dose steroids; near arrest - bradycardia, better with atropine  12/19 Afebrile . Remains on dilaudid and heparin gtt, dilaudid gtt increased overnight for concern of pain / ongoing tachycardia, no other events . NE and precedex off 12/17. Ongoing CRRT- even UF, dosing lokelmia this morning 12/20 On CRRT.  Renal plans for HD cath and moving to HD. Getting wound care  12/23 -No vent weaning per RT, remains on full support, #8 trach in place but previously had #6, no documentation of change noted in chart Afebrile / WBC 20.4  Vent - 30% FiO2, PEEP 5 Glucose range 176-212 I/O 465 ml stool, 3.6L removed with HD, -1.1L in last 24 hours  RN reports ongoing periods of tachypnea / vent dyssynchrony that responds to dilaudid   12/24 - renal stopping CRRT today and plans HD 10/24/20 . 40% fio2 on vent via Trach. TAchypenic and tachycardic. Afebrile but wbc up to 27.6K. On TF. Onn heparin gtt  12/25 - Back on CRRT. On vent via trach at 40% fio2, On scheduled dilaudid as add on to oxy. Per family request 12/24 - they felt scheduled oxy was not adequate and patient was showing  signs of opioid withdrawal.  Patient also had  worsening SIRS/sepsis  syndrome. Had fever, rising wbc, worsening RR 40-60 and HR 140s sinus-> started On abx yesterday. Fever some better today. WBC plateau at 28,.5K. On new levophed -> signifanct improvement in HR 77 and RR t0 20. On heparin gtt.  On precedex gtt. On levophed gtt 34mcg wthi midodrine. On TF 12/30 Remains on CRRT with intermittent pressor requirements. Ethics consult placed evening of 12/29. Ethics rec time trial of CRRT 12/31 failed SBT with RR 40s. Several conversations between care tam and family, who are upset RE plan of care 1/1 back on pressors  1/4: On pressors, keeping even on CVVHD, HGB drop to 6.9, transfused 1 unit. Improving mental status 1/10 remains on low dose levophed 53mcg 1/11 off levo, attempting HD with UF for first time.  -ethics consulted 12/30-- recs for time limited trial of CRRT, please see separate Ethics consult note for full details. Futility policy reviewed with family by ethics. Ongoing meetings with Dr. Hulen Skains with nursing administration and family. The ultimate goal is to transition her safely to HD from CRRT. 11/16/2020 again will be placed on intermittent hemodialysis. 11/16/2018 wheeze criteria for transition to progressive care since she has been off the ventilator for 72 hours. With an order for transfer to progressive care. 11/18/2020 is doing well on progressive care unit.  Consults:  Cardiology [facility] Nephrology  Ethics  Procedures:  R PICC 11/5 >> A line 11/9 >> out ETT 11/9 > 11/16, 11/16 >> 09/21/2020, 09/21/2020 tracheostomy>> Lt [place] CVL 11/9 >> R IJ trialysis >> out HD catheter 12/1 >>12/20 12/21 - 14.5 Fr, 23 cm right IJ tunneled hemodialysis catheter placement.  Removal of indwelling subclavian catheter.   Significant Diagnostic Tests:  11/3 CT abd/ pelvis > 1. Positive for bowel perforation: Pneumoperitoneum and intermediate density free fluid in the abdomen. Prior total colectomy. The specific site of perforation is unclear-oral contrast present to  the proximal jejunum has not obviously leaked. Note that there may be small bowel loops adherent to the ventral abdominal wall along the greater curve of the stomach. 2. Extensive bilateral lower lung pneumonia. No pleural effusion. 3. Other abdominal and pelvic viscera are stable since 2015, including bilateral adrenal adenomas. Chronic renal parapelvic cysts. 4. Aortic Atherosclerosis 11/3 TTE > EF 70-75%, RV not well visualized, mildly reduced RV systolic function 79/89 CT chest/ abd/ pelvis> 1. Interval progression of diffuse bilateral hazy ground-glass airspace opacities with more focal areas of consolidation at the lung bases 2. Trace bilateral pleural effusions. 3. Postsurgical changes the abdomen as detailed above. No evidence for a postoperative abscess, however evaluation is limited by lack of IV contrast. 4. There is a 1.9 cm cystic appearing lesion located in the pancreatic body. This was not present on the patient's CT from 2015.  Follow-up with an outpatient contrast enhanced MRI is recommended. 5. The endometrial stripe appears diffusely thickened. Follow-up with pelvic ultrasound is recommended. Aortic Atherosclerosis 11/14 LE doppler studies > + DVT of right posterior tibial and peroneal vein, +dVT of left posterior tibial vein  11/26 CT ABD PEL > liver unremarkable, distended gallbladder with layering tiny gallstones versus sludge, no duct dilatation, mild hyperdensity right upper pole renal collecting system new.  No evidence of retroperitoneal bleeding.  Multifocal lower lobe predominant pulmonary infiltrates/pneumonia.  Small left pleural effusion. 1/16 Venous duplex RUE >> negative Micro Data:  11/10 MRSA PCR > neg 11/10 BC x 2 > neg 09/22/2020 blood cultures x2>> S epi.  09/22/2020 sputum culture>> MRSA Blood 12/1 >> negative. S.epi 1 out of 2, likely contaminant BCx 2 12/12 >> neg xxx Trach aspirate 12/24 - FEW STAPHYLOCOCCUS AUREUS  Blood 12/24 > Negative  Blood  1/2: neg Sputum 1/4: MSSA  Antimicrobials:  azithro 11/2 >11/3 Ceftriaxone 11/2  Fluconazole 11/3 Zosyn 11/3 >> 11/7 Vanc 11/10 off Cefepime 11/10 > 11/16 09/22/2020 vancomycin for MRSA PNA >> 12/2 xxxx Vanc 12/11> 12/17 Zosyn 12/11> 12/17 xxxx vanc 12/24 (staph resp cutlure) >> 12/28 Zosyn 12/24 - 12/27 Cefazolin 12/27 >>1/1 linezolid 1/2 eraxis 1/2  Meropenem 1/2 > 1/8  Subjective:   Remains chronic critically ill Afebrile On trach collar 28%, 5 L  Objective   Blood pressure 125/79, pulse (!) 115, temperature 98 F (36.7 C), temperature source Axillary, resp. rate 18, height 5\' 8"  (1.727 m), weight 92.6 kg, SpO2 97 %.    FiO2 (%):  [28 %] 28 %   Intake/Output Summary (Last 24 hours) at 11/22/2020 1523 Last data filed at 11/22/2020 1025 Gross per 24 hour  Intake 980 ml  Output 2650 ml  Net -1670 ml   Filed Weights   11/16/20 1500 11/16/20 1802 11/16/20 2120  Weight: 86 kg 85 kg 92.6 kg   Physical Exam  Awake but frail appearing elderly woman Tracheostomy is in place copious thick secretions Chest shallow respirations, no accessory muscle use Cardiac S1-S2 tacky Abdomen soft, nontender Extremities are warm and dry   .  Labs show mild hypokalemia, no leukocytosis, stable anemia Chest x-ray 1/15 independently reviewed shows stable bibasal infiltrates   Resolved problems:  Previously hemorrhagic shock requiring PRBCs, Septic shock, MRSA pneumonia s/p 8 days abx Complicated by MRSA healthcare associated pneumonia (treated) Peritonitis and perforated ulcer post repair 09/01/20 MSSA HAP Vent dependency   Assessment:  Juliane Guest is a 70 y.o. with history of COVID 19 infection requiring hospitalization in October 2021 with current admission for hemorrhagic shock secondary to perforated gastric ulcer requiring surgical intervention and [place]. 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  Persistent SVT, sinus tachycardia -Continue metoprolol  AKI Lab Results  Component Value Date   CREATININE 1.59 (H) 11/22/2020   CREATININE 1.63 (H) 11/21/2020   CREATININE 1.19 (H) 11/20/2020    HD Per nephrology  Afib/DVT lower extremity Asymmetric right upper extremity edema/swelling negative ultrasound Per primary    Critical Illness Myopathy Severe deconditioning  Inadequate PO intake   Per primary   DM2 with hyperglycemia CBG (last 3)  Recent Labs    11/21/20 1633 11/21/20 1943 11/22/20 1139  GLUCAP 129* 178* 91     Scale insulin protocol Per primary    Stage IV sacral ulcer Per primary , general surgery following  History of stage IV colon cancer 1.9 cm cystic-appearing lesion located in pancreatic body no intervention at this time.  Outpatient evaluation as needed  multiple [place] conversations with various PCCM providers and family have not yielded changes in scope of tx, multiple PCCM providers have been fired by family.  Will need a trach/dialysis facility, daughter optimistic about going home   Best practice:  Diet: EN  11/18/2020 Was progressed to p.o. diet after passing swallow evaluation Pain/Anxiety/Delirium protocol (if indicated): Fentanyl patch VAP protocol (if indicated): yes DVT prophylaxis: Eliquis GI prophylaxis: Protonix Glucose control: SSI, TF coverage, lantus Mobility: PT Family communication: Updated daughter at bedside   Kara Mead MD. FCCP. [facility] See Amion for pager  If no response to pager , please call [phone]  After 7:00 pm call [place]  [phone]    11/22/2020, 3:23 PM

## 2020-11-22 NOTE — Progress Notes (Addendum)
Progress Note  Patient Name: Candace [name] Date of Encounter: 11/22/2020  Primary Cardiologist: Currently Dr. Gasper Sells - but lives in [place] if discharged should go to one of cardiologists in [place].  Subjective   Feeling "alright." No CP. Seen in HD.  Inpatient Medications    Scheduled Meds: . apixaban  2.5 mg Per Tube BID  . chlorhexidine gluconate (MEDLINE KIT)  15 mL Mouth Rinse BID  . Chlorhexidine Gluconate Cloth  6 each Topical Daily  . cholecalciferol  2,000 Units Per Tube Daily  . clonazepam  0.25 mg Per Tube TID  . collagenase   Topical BID  . darbepoetin (ARANESP) injection - DIALYSIS  100 mcg Intravenous Q Tue-HD  . feeding supplement (NEPRO CARB STEADY)  237 mL Per Tube TID BM  . feeding supplement (NEPRO CARB STEADY)  840 mL Per Tube Q24H  . feeding supplement (NEPRO CARB STEADY)  980 mL Per Tube Q24H  . feeding supplement (PROSource TF)  45 mL Per Tube QID  . fentaNYL  1 patch Transdermal Q72H  . free water  30 mL Per Tube Q4H  . guaiFENesin  15 mL Per Tube Q12H  . HYDROmorphone      .  HYDROmorphone (DILAUDID) injection  0.5 mg Intravenous Once  . insulin aspart  0-5 Units Subcutaneous QHS  . insulin aspart  0-9 Units Subcutaneous TID WC  . lipase/protease/amylase)  20,880 Units Per Tube Once   And  . sodium bicarbonate  650 mg Per Tube Once  . mouth rinse  15 mL Mouth Rinse QID  . metoprolol tartrate  50 mg Per Tube BID  . midodrine  40 mg Per Tube TID WC  . multivitamin  1 tablet Per Tube QHS  . nutrition supplement (JUVEN)  1 packet Per Tube BID BM  . pantoprazole sodium  40 mg Per Tube BID  . QUEtiapine  25 mg Oral Daily  . QUEtiapine  50 mg Oral QHS  . sodium chloride flush  10-40 mL Intracatheter Q12H   Continuous Infusions: . sodium chloride 5 mL/hr at 11/16/20 0935  . sodium chloride    . sodium chloride    . sodium chloride    . albumin human 25 g (11/16/20 1516)   PRN Meds: sodium chloride, [CANCELED] Place/Maintain arterial  line **AND** sodium chloride, sodium chloride, sodium chloride, acetaminophen (TYLENOL) oral liquid 160 mg/5 mL, albumin human, albuterol, alteplase, bacitracin, docusate, heparin, lidocaine (PF), lidocaine-prilocaine, metoprolol tartrate, ondansetron (ZOFRAN) IV, oxyCODONE, pentafluoroprop-tetrafluoroeth, phenol, polyethylene glycol, Resource ThickenUp Clear, sodium chloride, sodium chloride flush   Vital Signs    Vitals:   11/22/20 0715 11/22/20 0740 11/22/20 0749 11/22/20 0800  BP:  110/73 118/75 130/78  Pulse: (!) 106   (!) 114  Resp: (!) 21 (!) 22 (!) 21 20  Temp:      TempSrc:      SpO2: 100% 100%  100%  Weight:      Height:        Intake/Output Summary (Last 24 hours) at 11/22/2020 1018 Last data filed at 11/22/2020 0659 Gross per 24 hour  Intake 980 ml  Output 650 ml  Net 330 ml   Last 3 Weights 11/19/2020 11/19/2020 11/16/2020  Weight (lbs) (No Data) (No Data) 204 lb 2.3 oz  Weight (kg) (No Data) (No Data) 92.6 kg     Telemetry    Sinus tach with one brief sinus pause (1 sec) - HR mainly 95-100 sleeping, 110-120 awake. Rare PAC/PVC- Personally Reviewed  Physical Exam  GEN: No acute distress, appears physically debilitated HEENT: Normocephalic, atraumatic, sclera non-icteric. Neck: Trache collar in place Cardiac: Tachycardic, regular, no murmurs, rubs, or gallops.  Radials/DP/PT 1+ and equal bilaterally.  Respiratory: Coarse BS throughout, trache in place. GI: Soft, nontender, non-distended, BS +x 4. MS: no deformity. Extremities: No clubbing or cyanosis. No edema. Distal pedal pulses are 2+ and equal bilaterally. Neuro:  AAOx3. Follows commands. Psych:  Flat affect  Labs    High Sensitivity Troponin:  No results for input(s): TROPONINIHS in the last 720 hours.    Cardiac EnzymesNo results for input(s): TROPONINI in the last 168 hours. No results for input(s): TROPIPOC in the last 168 hours.   Chemistry Recent Labs  Lab 11/20/20 0629 11/21/20 0217  11/22/20 0640  NA 131* 133* 135  K 4.0 4.4 3.4*  CL 96* 97* 98  CO2 22 24 22   GLUCOSE 78 187* 114*  BUN 46* 57* 61*  CREATININE 1.19* 1.63* 1.59*  CALCIUM 9.8 10.1 10.5*  ALBUMIN  --   --  2.5*  GFRNONAA 49* 34* 35*  ANIONGAP 13 12 15      Hematology Recent Labs  Lab 11/17/20 0601 11/19/20 1100 11/22/20 0640  WBC 8.9 10.4 9.2  RBC 3.10* 3.26* 3.55*  HGB 8.3* 8.6* 9.5*  HCT 28.1* 29.8* 31.6*  MCV 90.6 91.4 89.0  MCH 26.8 26.4 26.8  MCHC 29.5* 28.9* 30.1  RDW 17.4* 17.0* 16.2*  PLT 311 352 365    BNPNo results for input(s): BNP, PROBNP in the last 168 hours.   DDimer No results for input(s): DDIMER in the last 168 hours.   Radiology    No results found.  Cardiac Studies   2D Echo 09/01/20 1. Left ventricular ejection fraction, by estimation, is 70 to 75%. The  left ventricle has hyperdynamic function. Left ventricular endocardial  border not optimally defined to evaluate regional wall motion, but appears  grossly normal. There is mild left  ventricular hypertrophy.  2. RV is not well visualized due to image quality. Right ventricular  systolic function is mildly reduced. The right ventricular size is mildly  enlarged. TR signal inadequate for assessing RVSP.  3. The mitral valve is normal in structure. Trivial mitral valve  regurgitation. No evidence of mitral stenosis.  4. The aortic valve is tricuspid. Aortic valve regurgitation is not  visualized. No aortic stenosis is present.  5. The inferior vena cava is normal in size with greater than 50%  respiratory variability, suggesting right atrial pressure of 3 mmHg.   2D echo 11/19/20 1. Mild hypokinesis of the inferior, inferoseptal, and anteroseptal  myocardium. Left ventricular ejection fraction, by estimation, is 55 to  60%. The left ventricle has normal function. The left ventricle  demonstrates regional wall motion abnormalities  (see scoring diagram/findings for description). Left ventricular  diastolic  parameters are consistent with Grade I diastolic dysfunction (impaired  relaxation).  2. Right ventricular systolic function is normal. The right ventricular  size is normal. There is normal pulmonary artery systolic pressure.  3. The mitral valve is normal in structure. Trivial mitral valve  regurgitation. No evidence of mitral stenosis.  4. The aortic valve is tricuspid. Aortic valve regurgitation is trivial.  No aortic stenosis is present.  5. Pulmonic valve regurgitation is moderate.  6. The inferior vena cava is normal in size with greater than 50%  respiratory variability, suggesting right atrial pressure of 3 mmHg.   Patient Profile     70 y.o. female with DM, HTN, HLD,  colon cancer with h/o colectomy/colostomy, then hypoxic respiratory failure due to Covid-19 pneumonitis in 07/2020  (requiring home O2, unvaccinated) with multitude of medical issues since that time - readmitted since 08/31/20. Issues include rapid atrial fib, bilateral DVTs (by duplex 11/14 but then mention in notes of re-evaluation suggested she did not have femoral/popliteal DVTs), perforated duodenal ulcer requiring exlap, hemorrhagic shock and septic shock with rocky course and recurrent hypotension during admission, respiratory failure requiring intubation and eventually tracheostomy, severe protein calorie malnutrition, acute liver injury, stage IV sacral decubital ulcer, acute metabolic encephalopathy, thrombocytopenia, hypercalcemia, renal failure requiring CRRT->progression to HD. Cardiology asked to see on 1/21 for sinus tach, previous atrial fib.  Assessment & Plan    1. Sinus tachycardia - suspected to be reactive to multitude of underlying medical issues - OK to continue metoprolol 22m BID but would likely not push up until more stability in hypotension issues that have required midodrine this admission - repeat TSH wnl 11/19/20 with normal fT4  2. PAF  - noted much earlier this admission  in context of immense physiologic stressors, has since been maintaining NSR/ST more recently - she is on Eliquis but at lower dose - will review dosing rec with MD (meets criteria for 531mBID dosing from cardiac standpoint but may be on lower dose due to multitude of other issues?). Per close review of MAR, had been on heparin but transitioned by pharmacy on 11/10/20 to Eliquis 2.65m36mID  3. Abormal echo - new WMA with hypokinesis of the inferior, inferoseptal, and anteroseptal myocardium but preserved LVEF overall - medical therapy favored in context of multiple comorbidities, can revisit reassessment as OP, medical therapy otherwise limited by issues with BP - no angina reported - avoiding ASA with recent perforated ulcer/hemorrhagic shock  4. Complex medical course as outlined above - per primary teams  - regarding hypotension, periodically not getting midodrine as ordered - last dose yesterday AM; PM dose listed as order parameters not met, then not given this AM as she was at HD - recommend primary team review dosing plan  5. AKI requiring CRRT, then HD - per nephrology  6. DVTs - bilateral DVTs noted on duplex 08/2020, then subsequent notes state "spoke with IR for IVC filter placement, after reevaluating ultrasound of lower extremities it was suggested patient does not have femoral/popliteal DVTs, so IVC filter was not placed" - treatment plan per IM  For questions or updates, please contact [facility]agleease consult [email or web address] for contact info under Cardiology/STEMI.  Signed, DayCharlie PitterA-C 11/22/2020, 10:18 AM

## 2020-11-23 DIAGNOSIS — I4891 Unspecified atrial fibrillation: Secondary | ICD-10-CM | POA: Diagnosis not present

## 2020-11-23 DIAGNOSIS — R000 Tachycardia, unspecified: Secondary | ICD-10-CM | POA: Diagnosis not present

## 2020-11-23 LAB — GLUCOSE, CAPILLARY
Glucose-Capillary: 106 mg/dL — ABNORMAL HIGH (ref 70–99)
Glucose-Capillary: 158 mg/dL — ABNORMAL HIGH (ref 70–99)
Glucose-Capillary: 141 mg/dL — ABNORMAL HIGH (ref 70–99)
Glucose-Capillary: 109 mg/dL — ABNORMAL HIGH (ref 70–99)

## 2020-11-23 MED ORDER — PANTOPRAZOLE SODIUM 40 MG PO PACK
40.0000 mg | PACK | Freq: Two times a day (BID) | ORAL | Status: DC
  Administered 2020-11-23 – 2020-12-07 (×26): 40 mg via ORAL
  Filled 2020-11-23 (×27): qty 20

## 2020-11-23 MED ORDER — VITAMIN D 25 MCG (1000 UNIT) PO TABS
2000.0000 [IU] | ORAL_TABLET | Freq: Every day | ORAL | Status: DC
  Administered 2020-11-24 – 2020-12-14 (×20): 2000 [IU] via ORAL
  Filled 2020-11-23 (×21): qty 2

## 2020-11-23 MED ORDER — OXYCODONE HCL 5 MG PO TABS
5.0000 mg | ORAL_TABLET | ORAL | Status: DC | PRN
  Administered 2020-11-24 – 2020-11-29 (×4): 5 mg via ORAL
  Filled 2020-11-23 (×4): qty 1

## 2020-11-23 MED ORDER — DOCUSATE SODIUM 50 MG/5ML PO LIQD: 100.0000 mg | Freq: Two times a day (BID) | ORAL | Status: DC | PRN

## 2020-11-23 MED ORDER — METOPROLOL TARTRATE 25 MG/10 ML ORAL SUSPENSION
50.0000 mg | Freq: Two times a day (BID) | ORAL | Status: DC
  Administered 2020-11-23 – 2020-12-07 (×26): 50 mg via ORAL
  Filled 2020-11-23 (×26): qty 20

## 2020-11-23 MED ORDER — APIXABAN 2.5 MG PO TABS
2.5000 mg | ORAL_TABLET | Freq: Two times a day (BID) | ORAL | Status: DC
  Administered 2020-11-23 – 2020-12-23 (×59): 2.5 mg via ORAL
  Filled 2020-11-23 (×60): qty 1

## 2020-11-23 MED ORDER — POLYETHYLENE GLYCOL 3350 17 G PO PACK: 17.0000 g | PACK | Freq: Every day | ORAL | Status: DC | PRN

## 2020-11-23 MED ORDER — ACETAMINOPHEN 160 MG/5ML PO SOLN
650.0000 mg | Freq: Four times a day (QID) | ORAL | Status: DC | PRN
  Administered 2020-11-27 – 2020-12-04 (×6): 650 mg via ORAL
  Filled 2020-11-23 (×6): qty 20.3

## 2020-11-23 MED ORDER — JUVEN PO PACK
1.0000 | PACK | Freq: Two times a day (BID) | ORAL | Status: DC
  Administered 2020-11-24 – 2020-12-02 (×13): 1 via ORAL
  Filled 2020-11-23 (×13): qty 1

## 2020-11-23 MED ORDER — GUAIFENESIN 100 MG/5ML PO SOLN
15.0000 mL | Freq: Two times a day (BID) | ORAL | Status: DC
  Administered 2020-11-23 – 2020-12-03 (×19): 300 mg via ORAL
  Administered 2020-12-04: 15 mL via ORAL
  Administered 2020-12-04 – 2020-12-16 (×23): 300 mg via ORAL
  Filled 2020-11-23 (×11): qty 15
  Filled 2020-11-23: qty 45
  Filled 2020-11-23: qty 15
  Filled 2020-11-23: qty 45
  Filled 2020-11-23: qty 15
  Filled 2020-11-23: qty 45
  Filled 2020-11-23 (×8): qty 15
  Filled 2020-11-23: qty 45
  Filled 2020-11-23: qty 15
  Filled 2020-11-23 (×3): qty 45
  Filled 2020-11-23 (×10): qty 15
  Filled 2020-11-23: qty 45
  Filled 2020-11-23: qty 15
  Filled 2020-11-23: qty 30
  Filled 2020-11-23: qty 15
  Filled 2020-11-23: qty 45

## 2020-11-23 MED ORDER — GUAIFENESIN-DM 100-10 MG/5ML PO SYRP
5.0000 mL | ORAL_SOLUTION | ORAL | Status: DC | PRN
  Administered 2020-12-02: 5 mL via ORAL
  Filled 2020-11-23: qty 5

## 2020-11-23 MED ORDER — RENA-VITE PO TABS
1.0000 | ORAL_TABLET | Freq: Every day | ORAL | Status: DC
  Administered 2020-11-23 – 2020-12-13 (×21): 1 via ORAL
  Filled 2020-11-23 (×21): qty 1

## 2020-11-23 MED ORDER — CLONAZEPAM 0.25 MG PO TBDP
0.2500 mg | ORAL_TABLET | Freq: Three times a day (TID) | ORAL | Status: DC
  Administered 2020-11-23 – 2020-11-24 (×3): 0.25 mg via ORAL
  Filled 2020-11-23 (×3): qty 1

## 2020-11-23 MED ORDER — NEPRO/CARBSTEADY PO LIQD: 237.0000 mL | Freq: Three times a day (TID) | ORAL | Status: DC

## 2020-11-23 MED ORDER — RESOURCE THICKENUP CLEAR PO POWD
ORAL | Status: DC | PRN
  Filled 2020-11-23: qty 125

## 2020-11-23 MED ORDER — MIDODRINE HCL 5 MG PO TABS
40.0000 mg | ORAL_TABLET | Freq: Three times a day (TID) | ORAL | Status: DC
  Administered 2020-11-23 – 2020-12-02 (×24): 40 mg via ORAL
  Filled 2020-11-23 (×26): qty 8

## 2020-11-23 NOTE — TOC Progression Note (Signed)
Transition of Care [facility]) - Progression Note    Patient Details  Name: Candace [name] MRN: [id number] Date of Birth: 08-14-1951  Transition of Care [facility]) CM/SW [place], RN Phone Number: 11/23/2020, 1:56 PM  Clinical Narrative:    Case management met with the patient and daughter, [name], at the bedside this morning to discuss transitions of care.  The daughter, [name], verbalized that the patient would prefer to go home with home health services if at all possible since the patient verbalizes in the past that she never wants to be placed in a long term care facility.  [name] states that the patient has a large family that is present in the [facility] area and presently lives with an adult daughter and son, [name] and [name] that are both certified nursing assistants and willing to provide care to the patient in the home 24 hours per day supervision.  The daughter, [name] is retired and lives just a few miles away from the patient's home and is willing to stay with the patient as well to provide care needs.  The daughter is very happy for the care that her mom received in the home regarding home health services with [facility] for RN at the present.  I presented to the daughter that once the patient is safe for discharge at a later date that I would call Alvis Lemmings to provide a full range of appropriate home health services that are needed.  The daughter states that the patient has a single level home and plans to have family members place the wheelchair ramp back on the home as soon as possible.  The patient presently has home O2,  rolling walker, cane and 3:1 at home at this time.  The daughter states that she will need a hospital bed with air mattress and wheelchair for mobility at home.    The patient's cortrak was removed yesterday after it was found to be clogged.  Nursing is making a calorie count of nutrition ingested if not enough - cortrak may have to replaced tomorrow.     The daughter states that a wound vac was placed on the patient's sacral wound and yellow fluid is noted in the drainage container at the bedside.  The patient's abdominal wound has clean dry dressing in place with no drains noted.  I spoke to the patient's daughter and patient at the bedside regarding transitions of care needs.  I offered to them a packet of information regarding [facility].  The daughter states that she would view the information and I'd be glad to make a referral as needed to assist in providing transportation and nursing needs in addition to home health services through [facility].  CM and MSW will continue to follow for transitions of care needs.   Expected Discharge Plan: [place] Barriers to Discharge: Continued Medical Work up  Expected Discharge Plan and Services Expected Discharge Plan: [place] In-house Referral: Clinical Social Work Discharge Planning Services: CM Consult Post Acute Care Choice: Durable Medical Equipment,Home Health,Dialysis Living arrangements for the past 2 months: Single Family Home                 DME Arranged: N/A DME Agency: NA       HH Arranged: NA HH Agency: NA         Social Determinants of Health (SDOH) Interventions    Readmission Risk Interventions Readmission Risk Prevention Plan 11/19/2020  Transportation  Screening Complete  Medication Review (RN Care Manager) Complete  PCP or Specialist appointment within 3-5 days of discharge Complete  HRI or Home Care Consult Complete  SW Recovery Care/Counseling Consult Complete  Palliative Care Screening Complete  Skilled Nursing Facility Complete  Some recent data might be hidden

## 2020-11-23 NOTE — Consult Note (Signed)
[place] Nurse wound follow up Patient receiving care in [place]. 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 Measurement: See PT hydrotherapy note from yesterday for wound particulars Wound bed: Drainage (amount, consistency, odor)  Periwound: Dressing procedure/placement/frequency:  Continue existing plan of care. Val Riles, RN, MSN, CWOCN, CNS-BC, pager [phone]

## 2020-11-23 NOTE — TOC Progression Note (Signed)
Transition of Care [facility]) - Progression Note    Patient Details  Name: Candace [name] MRN: [id number] Date of Birth: 08/03/1951  Transition of Care [facility]) CM/SW [place], RN Phone Number: 11/23/2020, 7:47 AM  Clinical Narrative:    Case management spoke with [name], NP regarding daughter's wishes  [name]) to take her mother home with home health services including wishes for home hemodialysis.  I called and spoke with Terri Piedra, CSW, renal navigator and she plans to message Dr. Joelyn Oms, nephrology, in regards to family's request to have hemodialysis set up at home. Dr. Marylou Flesher will need to be is agreement to support the family's wishes at this point and determine if this discharge plan is safe for the patient's family to deliver since patient is medically complex.  I called and spoke with [employer] on the phone regarding this matter and both [facility] and [facility] home health are both in network with the insurance company to provide home hemodialysis support to the family.   I plan to call / meet with the daughter, [name], this week to discuss discharge planning goals for the patient and family.  The patient's family will need extensive teaching and home health services to support these goals. The patient is currently active with [facility] for home health and [employer] for home oxygen services at this time.  CM and MSW will continue to support transitions of care with patient and family.   Expected Discharge Plan: [place] Barriers to Discharge: Continued Medical Work up  Expected Discharge Plan and Services Expected Discharge Plan: [place] In-house Referral: Clinical Social Work Discharge Planning Services: CM Consult Post Acute Care Choice: [place] arrangements for the past 2 months: Single Family Home                 DME Arranged: N/A DME Agency: NA        HH Arranged: NA HH Agency: NA         Social Determinants of Health (SDOH) Interventions    Readmission Risk Interventions Readmission Risk Prevention Plan 11/19/2020  Transportation Screening Complete  Medication Review [employer]) Complete  PCP or Specialist appointment within 3-5 days of discharge Complete  HRI or Home Care Consult Complete  SW Recovery Care/Counseling Consult Complete  Palliative Care Screening Complete  Skilled Nursing Facility Complete  Some recent data might be hidden

## 2020-11-23 NOTE — Progress Notes (Signed)
[facility] Progress Note     Assessment:   1. HD dep't AKI.CRRT stopped 11/03/2020.Brodnax placed on 10/19/20. Family desired ongoing full scope of care, Now on IHD 2. SVT 3. Hyperkalemia; stable 4. H/o mixed shock: hemorrhagic and septic shock; stable 5. Anemia;  on ESA 6. Perforated duodenal ulcer s/p exlap and graham patch placement 7. 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 ARDS 2/2 COVID-19 Pneumonia 9. A.fib/ DVT 10. Severe calorie malnutrition 11. H/o stage IV colon cancer 12. Hypercalcemia- immobility, stable 13. Metabolic Acidosis, resolved  PLAN: - HD tomorrow, UF goal is 2L.   Continue current medications. Complex dispo planning noted.  Subjective:   No new event.  Lying in bed on HD.  Alert awake and following commands.   Objective:   BP 124/75   Pulse (!) 105   Temp 98.7 F (37.1 C) (Axillary)   Resp 17   Ht 5' 8"  (1.727 m)   Wt 92.6 kg   SpO2 98%   BMI 31.04 kg/m   Intake/Output Summary (Last 24 hours) at 11/23/2020 1112 Last data filed at 11/23/2020 0920 Gross per 24 hour  Intake 10 ml  Output 150 ml  Net -140 ml   Weight change:   Physical Exam: Gen: Chronically ill looking female lying on bed comfortable HEENT: trach in place CVS: tachy, S1-S2 normal Resp: unlabored, trach Abd: soft, nontender Ext:  No edema appreciated Neuro: follows commands, awake, slow to respond ACCESS: R IJ TDC  Imaging: No results found.  Labs: BMET Recent Labs  Lab 11/17/20 0601 11/19/20 0627 11/20/20 0629 11/21/20 0217 11/22/20 0640  NA 138 144 131* 133* 135  K 3.6 4.4 4.0 4.4 3.4*  CL 104 107 96* 97* 98  CO2 24 23 22 24 22   GLUCOSE 163* 97 78 187* 114*  BUN 80* 103* 46* 57* 61*  CREATININE 0.95 1.10* 1.19* 1.63* 1.59*  CALCIUM 8.7* 10.0 9.8 10.1 10.5*  PHOS 4.1 5.3*  --   --  7.0*   CBC Recent Labs  Lab 11/17/20 0601 11/19/20 1100 11/22/20 0640  WBC 8.9 10.4 9.2  HGB 8.3* 8.6* 9.5*  HCT 28.1* 29.8* 31.6*  MCV 90.6 91.4 89.0   PLT 311 352 365    Medications:    . (feeding supplement) PROSource Plus  30 mL Oral TID WC  . apixaban  2.5 mg Per Tube BID  . chlorhexidine gluconate (MEDLINE KIT)  15 mL Mouth Rinse BID  . Chlorhexidine Gluconate Cloth  6 each Topical Daily  . cholecalciferol  2,000 Units Per Tube Daily  . clonazepam  0.25 mg Per Tube TID  . collagenase   Topical BID  . darbepoetin (ARANESP) injection - DIALYSIS  100 mcg Intravenous Q Tue-HD  . feeding supplement (NEPRO CARB STEADY)  237 mL Per Tube TID BM  . feeding supplement (NEPRO CARB STEADY)  980 mL Per Tube Q24H  . feeding supplement (PROSource TF)  45 mL Per Tube QID  . fentaNYL  1 patch Transdermal Q72H  . free water  30 mL Per Tube Q4H  . guaiFENesin  15 mL Per Tube Q12H  . insulin aspart  0-5 Units Subcutaneous QHS  . insulin aspart  0-9 Units Subcutaneous TID WC  . lipase/protease/amylase)  20,880 Units Per Tube Once   And  . sodium bicarbonate  650 mg Per Tube Once  . mouth rinse  15 mL Mouth Rinse QID  . metoprolol tartrate  50 mg Per Tube BID  . midodrine  40 mg Per Tube TID WC  . multivitamin  1 tablet Per Tube QHS  . nutrition supplement (JUVEN)  1 packet Per Tube BID BM  . pantoprazole sodium  40 mg Per Tube BID  . QUEtiapine  25 mg Oral Daily  . QUEtiapine  50 mg Oral QHS  . sodium chloride flush  10-40 mL Intracatheter Q12H      Justin Mend, MD 11/23/2020, 11:12 AM

## 2020-11-23 NOTE — Progress Notes (Signed)
PROGRESS NOTE    Candace [name]  QAS:[id number] DOB: Mar 14, 1951 DOA: 08/31/2020 PCP: Algis Greenhouse, MD    Brief Narrative:  Received this patient after 77 days in intensive care unit, most of the events are as per hospital records and previous providers documentation.  Patient does have history of colon cancer status post colon resection with colostomy bag, hyperlipidemia, type 2 diabetes, stage III chronic kidney disease.  She was admitted to the hospital and treated for COVID-19 pneumonia and was discharged back home.  Sent to the ER by her home care nurse due to elevated heart rate. 70 year old female who was recently diagnosed with COVID on 08/23/2020 came back to the ER with not feeling well, she was admitted on 08/31/2020 with A. fib with RVR, hemorrhagic shock and found to have perforated duodenal ulcer for that she underwent exploratory laparotomy on 11/3.  She had extensive ICU stay with significant events as below.  11/2 Admitted with rapid Afib 11/3 OR with findings of perforated duodenal ulcer 11/10 progressive hemorrhagic shock, intubated, transfused, pressors, proned; started on CRRT in PM 11/16 Extubated. Re-intubated overnight due to respiratory distress and hypoxia with decreased mentation 11/18 Bronch, cultures sent 11/19 Hgb down getting blood 11/24 Spiked fever, resume empirical antimicrobial therapy 11/26 Hemorrhagic shock, hgb 5.6, increased pressors,  11/30 Per palliative "[name] understanding that patient is unfortunately very fragiledespite ongoing intensive medical treatment and full mechanical support. [name] that the familywantsto continue with all current interventions despite potential outcomes". 12/08 CRRT discontinued due to clotting 12/09 Family requested transfer to tertiary care [facility]). [facility] denied transfer  12/10 CRRT restarted. Episodes of tachycardia, tachypnea that seem to improve with pain management 12/11 Back in shock. Pressor  requirements up. CXR worse. ABX resumed 12/12 Still requiring inc pressors. Had hypoglycemic event. Basal insulin dosing adjusted 12/13 Pressor requirements better. Now hyperglycemic. Re-adjusted Glycemic control  12/19 Afebrile . Remains on dilaudid and heparin gtt, dilaudid gtt increased overnight for concern of pain / ongoing tachycardia, no other events . NE and precedex off 12/17. Ongoing CRRT- even UF, dosing lokelmia this morning 12/20 On CRRT.  Renal plans for HD cath and moving to HD. Getting wound care 12/24 - renal stopping CRRT today and plans HD 10/24/20 . 40% fio2 on vent via Trach. TAchypenic and tachycardic. Afebrile but wbc up to 27.6K. On TF. On heparin gtt 12/25 - Back on CRRT. On vent via trach at 40% fio2, On scheduled dilaudid as add on to oxy. Per family request 12/24 - they felt scheduled oxy was not adequate and patient was showing signs of opioid withdrawal.  Patient also had  worsening SIRS/sepsis syndrome. Had fever, rising wbc, worsening RR 40-60 and HR 140s sinus-> started On abx yesterday. Fever some better today. WBC plateau at 28,.5K. On new levophed -> signifanct improvement in HR 77 and RR t0 20. On heparin gtt.  On precedex gtt. On levophed gtt 82mg wthi midodrine. On TF 12/30 Remains on CRRT with intermittent pressor requirements. Ethics consult placed 12/29. Ethics rec time trial of CRRT 12/31 failed SBT with RR 40s. Several conversations between care tam and family, who are upset RE plan of care 1/1 back on pressors  1/4: On pressors, keeping even on CVVHD, HGB drop to 6.9, transfused 1 unit. Improving mental status 1/10 remains on low dose levophed 265m 1/11 off levo, attempting HD with UF for first time. Now tolerating intermittent HD. 1/19 patient transferred to progressive bed with tracheostomy on 35% FiO2.  has NG tube  feeding.  No PEG tube.  Receiving dialysis.  Assessment & Plan:   Principal Problem:   Atrial fibrillation with RVR (HCC) Active  Problems:   Acute respiratory failure due to COVID-19 (HCC)   New onset a-fib (HCC)   Leukocytosis   Atrial fibrillation with rapid ventricular response (HCC)   Hypoxia   Pressure injury of skin   Acute hypoxemic respiratory failure (HCC)   ARDS (adult respiratory distress syndrome) (HCC)   Perforated duodenal ulcer ([place])   On mechanically assisted ventilation ([place])   Palliative care by specialist   Goals of care, counseling/discussion   Shock ([place])   Acute and chronic respiratory failure (acute-on-chronic) ([place])   Status post tracheostomy ([place])   ESRD (end stage renal disease) ([place])  Paroxysmal A. fib with RVR:  Now in sinus rhythm.  Adjusted with metoprolol doses.  Gets intradialytic hypotension and on midodrine.  Seen by cardiology.  They recommended symptomatic treatment and beta-blocker titration as much as she can tolerate.    Acute respiratory failure due to COVID-19 pneumonia: Status post tracheostomy.  Multiple bacterial pneumonias.  VDRF: Completed all antibiotic therapy. Currently on trach collar with 28% FiO2.   Respiratory therapy and PCCM following.  Aggressive suctioning and chest physiotherapy as much possible.  Wean as possible. Patient was able to tolerate Passy-Muir valve.  She passed modified barium swallow evaluation and able to start on dysphagia 2 diet with thin liquids, however no enough intake. PCCM following to see if they can downsize her tracheostomy.  Acute metabolic encephalopathy: Improved much. Patient is able to follow commands and have conversation.   Extremely debilitated.  Generalized weakness but no focal neurological deficit. She has much improved mentation now.  Decreasing dose of Klonopin and Seroquel.  hemorrhagic shock: Resolved now.  Blood pressures are adequate.  Hemoglobin is adequate.  AKI due to sepsis: Currently on intermittent hemodialysis.  Remains on midodrine for blood pressure support. Family interested on dialysis at home,  social worker and nephrology following. Receiving intermittent dialysis.   A. Fib/DVT of lower extremity on anticoagulation: Continue Eliquis.  Tolerating.   Anemia of critical illness: Multiple transfusions.  Total 12 units of PRBC.  Last PRBC given on 11/11/2020 and has remained stable.  We will continue to closely monitor.  Critical illness myopathy/severe physical deconditioning/inadequate oral intake: Dobbhoff tube feeding present for more than 2 months. He started on dysphagia diet, however patient is not eating and and is hypoglycemic despite discontinuing insulin doses. Continue oral diet with supervision, 1/24, Dobbhoff tube clogged, unable to declog with conservative measures.  Removed. Calorie count for next 48 hours, does have poor intake.  May end up having PEG tube , however this is going to be challenging with her duodenal ulcer and midline incision.  Surgery following.  Type 2 diabetes with hyperglycemia: Episodes of hypoglycemia after stopping tube feedings. Continue to use sliding scale insulin.  Discontinue long-acting insulins.  Stage IV sacral decubitus ulcer: Not present on admission.  Patient has extensive sacral decubitus ulcer.  Followed by wound care. Seen by surgery.  Recommended against general anesthesia and surgical procedure.  Local wound care instructions present. Currently receiving hydrotherapy.  History of stage IV colon cancer: She has a colostomy.  Outpatient follow-up.  Pain management: Fentanyl patch, Dilaudid as needed rarely using. Seroquel 50 in the morning and 100 in the evening.  Currently optimal pain management. Reducing dose of Seroquel and Klonopin to help her stay awake.   DVT prophylaxis: apixaban (ELIQUIS) tablet 2.5 mg  Start: 11/21/20 2200 Place and maintain sequential compression device Start: 09/07/20 1349 Place TED hose Start: 08/31/20 1711 apixaban (ELIQUIS) tablet 2.5 mg   Code Status: Full code Family Communication: Patient's  daughter on phone with nurse. Disposition Plan: Status is: Inpatient  Remains inpatient appropriate because:Unsafe d/c plan and Inpatient level of care appropriate due to severity of illness   Dispo: The patient is from: Home              Anticipated d/c is to: Home with home health              Anticipated d/c date is: > 3 days              Patient currently is not medically stable to d/c.  Consultants:   Cardiology  Surgery  Nephrology  Ethics  PCCM  Procedures:  R PICC 11/5 >> A line 11/9 >> out ETT 11/9 > 11/16, 11/16 >> 09/21/2020, 09/21/2020 tracheostomy>> Lt [place] CVL 11/9 >> R IJ trialysis >> out HD catheter 12/1 >>12/20 12/21 - 14.5 Fr, 23 cm right IJ tunneled hemodialysis catheter placement. Removal of indwelling subclavian catheter.   Antimicrobials:  11/10 MRSA PCR > neg 11/10 BC x 2 > neg 09/22/2020 blood cultures x2>> S epi.  09/22/2020 sputum culture>> MRSA Blood 12/1 >> negative. S.epi 1 out of 2, likely contaminant BCx 2 12/12 >> neg xxx Trach aspirate 12/24 - FEW STAPHYLOCOCCUS AUREUS  Blood 12/24 > Negative  Blood 1/2: neg Sputum 1/4: MSSA  Antibiotics  azithro 11/2 >11/3 Ceftriaxone 11/2  Fluconazole 11/3 Zosyn 11/3 >> 11/7 Vanc 11/10 off Cefepime 11/10 > 11/16 09/22/2020 vancomycin for MRSA PNA >> 12/2 xxxx Vanc 12/11> 12/17 Zosyn 12/11> 12/17 xxxx vanc 12/24 (staph resp cutlure) >> 12/28 Zosyn 12/24 - 12/27 Cefazolin 12/27 >>1/1 linezolid 1/2 eraxis 1/2  Meropenem 1/2 > 1/8   Subjective: Patient seen and examined.  No overnight events.  Difficult to understand with her voice, however she denied any pain.  Denies any abdominal pain.  She is trying to say that food does not taste well.  No family at bedside today.  Objective: Vitals:   11/23/20 0857 11/23/20 0900 11/23/20 1000 11/23/20 1125  BP: 124/75 124/75  120/73  Pulse: (!) 115  (!) 105 (!) 103  Resp:   17 18  Temp:    98.5 F (36.9 C)  TempSrc:    Axillary  SpO2:     94%  Weight:      Height:        Intake/Output Summary (Last 24 hours) at 11/23/2020 1132 Last data filed at 11/23/2020 0920 Gross per 24 hour  Intake 10 ml  Output 150 ml  Net -140 ml   Filed Weights   11/16/20 1500 11/16/20 1802 11/16/20 2120  Weight: 86 kg 85 kg 92.6 kg    Examination:  General exam: Sick and frail looking.  Respiratory system: Conducted upper airway sounds.  Otherwise bilateral clear.  Today she is on 28% FiO2.  Intermittently coughing. Patient has plenty of clear secretions on her trach. Cardiovascular system: S1 & S2 heard, RRR.  Heart rate is stable. Gastrointestinal system: Abdomen is nondistended, soft.  No localized tenderness.  She has a right lower quadrant colostomy bag with brown liquidy stool.  Midline incision with granulation tissue, no evidence of infection.. Central nervous system: Alert and awake.  Follows commands.  Looks appropriate.   Extremities: Extremely debilitated.  3/5 all extremities. Skin: Stage IV sacral decubitus ulcer with undetermined  base, pictures in the chart.    Data Reviewed: I have personally reviewed following labs and imaging studies  CBC: Recent Labs  Lab 11/17/20 0601 11/19/20 1100 11/22/20 0640  WBC 8.9 10.4 9.2  HGB 8.3* 8.6* 9.5*  HCT 28.1* 29.8* 31.6*  MCV 90.6 91.4 89.0  PLT 311 352 803   Basic Metabolic Panel: Recent Labs  Lab 11/17/20 0601 11/19/20 0627 11/20/20 0629 11/21/20 0217 11/22/20 0640  NA 138 144 131* 133* 135  K 3.6 4.4 4.0 4.4 3.4*  CL 104 107 96* 97* 98  CO2 24 23 22 24 22   GLUCOSE 163* 97 78 187* 114*  BUN 80* 103* 46* 57* 61*  CREATININE 0.95 1.10* 1.19* 1.63* 1.59*  CALCIUM 8.7* 10.0 9.8 10.1 10.5*  MG 1.5* 1.5* 2.9*  --   --   PHOS 4.1 5.3*  --   --  7.0*   GFR: Estimated Creatinine Clearance: 39.7 mL/min (A) (by C-G formula based on SCr of 1.59 mg/dL (H)). Liver Function Tests: Recent Labs  Lab 11/22/20 0640  ALBUMIN 2.5*   No results for input(s): LIPASE,  AMYLASE in the last 168 hours. No results for input(s): AMMONIA in the last 168 hours. Coagulation Profile: No results for input(s): INR, PROTIME in the last 168 hours. Cardiac Enzymes: No results for input(s): CKTOTAL, CKMB, CKMBINDEX, TROPONINI in the last 168 hours. BNP (last 3 results) No results for input(s): PROBNP in the last 8760 hours. HbA1C: No results for input(s): HGBA1C in the last 72 hours. CBG: Recent Labs  Lab 11/22/20 1139 11/22/20 1553 11/22/20 2042 11/23/20 0718 11/23/20 1122  GLUCAP 91 158* 150* 106* 158*   Lipid Profile: No results for input(s): CHOL, HDL, LDLCALC, TRIG, CHOLHDL, LDLDIRECT in the last 72 hours. Thyroid Function Tests: No results for input(s): TSH, T4TOTAL, FREET4, T3FREE, THYROIDAB in the last 72 hours. Anemia Panel: No results for input(s): VITAMINB12, FOLATE, FERRITIN, TIBC, IRON, RETICCTPCT in the last 72 hours. Sepsis Labs: No results for input(s): PROCALCITON, LATICACIDVEN in the last 168 hours.  Recent Results (from the past 240 hour(s))  Culture, respiratory (non-expectorated)     Status: None (Preliminary result)   Collection Time: 11/22/20  4:45 PM   Specimen: Tracheal Aspirate; Respiratory  Result Value Ref Range Status   Specimen Description TRACHEAL ASPIRATE  Final   Special Requests NONE  Final   Gram Stain   Final    ABUNDANT WBC PRESENT,BOTH PMN AND MONONUCLEAR ABUNDANT GRAM POSITIVE COCCI IN PAIRS    Culture   Final    TOO YOUNG TO READ Performed at [facility], [place] [address], [place], [place] [address]    Report Status PENDING  Incomplete         Radiology Studies: No results found.      Scheduled Meds: . (feeding supplement) PROSource Plus  30 mL Oral TID WC  . apixaban  2.5 mg Per Tube BID  . chlorhexidine gluconate (MEDLINE KIT)  15 mL Mouth Rinse BID  . Chlorhexidine Gluconate Cloth  6 each Topical Daily  . cholecalciferol  2,000 Units Per Tube Daily  . clonazepam  0.25 mg Per Tube TID   . collagenase   Topical BID  . darbepoetin (ARANESP) injection - DIALYSIS  100 mcg Intravenous Q Tue-HD  . feeding supplement (NEPRO CARB STEADY)  237 mL Per Tube TID BM  . feeding supplement (NEPRO CARB STEADY)  980 mL Per Tube Q24H  . feeding supplement (PROSource TF)  45 mL Per Tube  QID  . fentaNYL  1 patch Transdermal Q72H  . free water  30 mL Per Tube Q4H  . guaiFENesin  15 mL Per Tube Q12H  . insulin aspart  0-5 Units Subcutaneous QHS  . insulin aspart  0-9 Units Subcutaneous TID WC  . lipase/protease/amylase)  20,880 Units Per Tube Once   And  . sodium bicarbonate  650 mg Per Tube Once  . mouth rinse  15 mL Mouth Rinse QID  . metoprolol tartrate  50 mg Per Tube BID  . midodrine  40 mg Per Tube TID WC  . multivitamin  1 tablet Per Tube QHS  . nutrition supplement (JUVEN)  1 packet Per Tube BID BM  . pantoprazole sodium  40 mg Per Tube BID  . QUEtiapine  25 mg Oral Daily  . QUEtiapine  50 mg Oral QHS  . sodium chloride flush  10-40 mL Intracatheter Q12H   Continuous Infusions: . sodium chloride 5 mL/hr at 11/16/20 0935  . sodium chloride    . albumin human 25 g (11/16/20 1516)     LOS: 83 days    Time spent: 30 minutes    Barb Merino, MD [facility] Pager [phone]

## 2020-11-24 DIAGNOSIS — Z930 Tracheostomy status: Secondary | ICD-10-CM | POA: Diagnosis not present

## 2020-11-24 DIAGNOSIS — I4891 Unspecified atrial fibrillation: Secondary | ICD-10-CM | POA: Diagnosis not present

## 2020-11-24 DIAGNOSIS — J9621 Acute and chronic respiratory failure with hypoxia: Secondary | ICD-10-CM | POA: Diagnosis not present

## 2020-11-24 LAB — RENAL FUNCTION PANEL
Albumin: 2.5 g/dL — ABNORMAL LOW (ref 3.5–5.0)
Anion gap: 13 (ref 5–15)
BUN: 45 mg/dL — ABNORMAL HIGH (ref 8–23)
CO2: 25 mmol/L (ref 22–32)
Calcium: 10.1 mg/dL (ref 8.9–10.3)
Chloride: 99 mmol/L (ref 98–111)
Creatinine, Ser: 2.15 mg/dL — ABNORMAL HIGH (ref 0.44–1.00)
GFR, Estimated: 24 mL/min — ABNORMAL LOW (ref >60)
Glucose, Bld: 101 mg/dL — ABNORMAL HIGH (ref 70–99)
Phosphorus: 7.1 mg/dL — ABNORMAL HIGH (ref 2.5–4.6)
Potassium: 3.8 mmol/L (ref 3.5–5.1)
Sodium: 137 mmol/L (ref 135–145)

## 2020-11-24 LAB — CBC
HCT: 28.9 % — ABNORMAL LOW (ref 36.0–46.0)
Hemoglobin: 8.9 g/dL — ABNORMAL LOW (ref 12.0–15.0)
MCH: 27.3 pg (ref 26.0–34.0)
MCHC: 30.8 g/dL (ref 30.0–36.0)
MCV: 88.7 fL (ref 80.0–100.0)
Platelets: 397 10*3/uL (ref 150–400)
RBC: 3.26 MIL/uL — ABNORMAL LOW (ref 3.87–5.11)
RDW: 16.1 % — ABNORMAL HIGH (ref 11.5–15.5)
WBC: 12.5 10*3/uL — ABNORMAL HIGH (ref 4.0–10.5)
nRBC: 0 % (ref 0.0–0.2)

## 2020-11-24 LAB — GLUCOSE, CAPILLARY
Glucose-Capillary: 92 mg/dL (ref 70–99)
Glucose-Capillary: 102 mg/dL — ABNORMAL HIGH (ref 70–99)
Glucose-Capillary: 98 mg/dL (ref 70–99)

## 2020-11-24 MED ORDER — SODIUM CHLORIDE 0.9 % IV SOLN: 100.0000 mL | INTRAVENOUS | Status: DC | PRN

## 2020-11-24 MED ORDER — HEPARIN SODIUM (PORCINE) 1000 UNIT/ML DIALYSIS: 1000.0000 [IU] | INTRAMUSCULAR | Status: DC | PRN

## 2020-11-24 MED ORDER — LIDOCAINE HCL (PF) 1 % IJ SOLN: 5.0000 mL | INTRAMUSCULAR | Status: DC | PRN

## 2020-11-24 MED ORDER — HEPARIN SODIUM (PORCINE) 1000 UNIT/ML IJ SOLN
INTRAMUSCULAR | Status: AC
  Filled 2020-11-24: qty 4

## 2020-11-24 MED ORDER — PENTAFLUOROPROP-TETRAFLUOROETH EX AERO: 1.0000 "application " | INHALATION_SPRAY | CUTANEOUS | Status: DC | PRN

## 2020-11-24 MED ORDER — CLONAZEPAM 0.25 MG PO TBDP
0.2500 mg | ORAL_TABLET | Freq: Two times a day (BID) | ORAL | Status: DC
  Administered 2020-11-25: 0.25 mg via ORAL
  Filled 2020-11-24 (×2): qty 1

## 2020-11-24 MED ORDER — QUETIAPINE FUMARATE 25 MG PO TABS
25.0000 mg | ORAL_TABLET | Freq: Every day | ORAL | Status: DC
  Administered 2020-11-24: 25 mg via ORAL
  Filled 2020-11-24: qty 1

## 2020-11-24 MED ORDER — ALTEPLASE 2 MG IJ SOLR: 2.0000 mg | Freq: Once | INTRAMUSCULAR | Status: DC | PRN

## 2020-11-24 MED ORDER — LIDOCAINE-PRILOCAINE 2.5-2.5 % EX CREA: 1.0000 "application " | TOPICAL_CREAM | CUTANEOUS | Status: DC | PRN

## 2020-11-24 NOTE — Progress Notes (Signed)
[facility] Progress Note     Assessment:   1. HD dep't AKI.CRRT stopped 11/03/2020.[place] placed on 10/19/20. Family desired ongoing full scope of care, Now on IHD 2. SVT 3. Hyperkalemia; stable 4. H/o mixed shock: hemorrhagic and septic shock; stable 5. Anemia;  on ESA 6. Perforated duodenal ulcer s/p exlap and graham patch placement 7. 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 ARDS 2/2 COVID-19 Pneumonia 9. A.fib/ DVT 10. Severe calorie malnutrition 11. H/o stage IV colon cancer 12. Hypercalcemia- immobility, stable 13. Metabolic Acidosis, resolved  PLAN: - HD today, UF goal is 2L.   Continue current medications. Complex dispo planning noted.  Subjective:   No new event.  Lying in bed on HD.  Alert awake and following commands. UOP 334m, still with intradialytic Cr rise suggestive of need for dialysis.    Objective:   BP 121/80   Pulse (!) 105   Temp 98 F (36.7 C) (Axillary)   Resp 19   Ht 5' 8"  (1.727 m)   Wt 92.6 kg   SpO2 100%   BMI 31.04 kg/m   Intake/Output Summary (Last 24 hours) at 11/24/2020 1023 Last data filed at 11/24/2020 0330 Gross per 24 hour  Intake -  Output 600 ml  Net -600 ml   Weight change:   Physical Exam: Gen: Chronically ill looking female lying on bed comfortable HEENT: trach in place CVS: tachy, S1-S2 normal Resp: unlabored, trach; scattered ant rhonchi Abd: soft, nontender Ext:  No edema appreciated Neuro: follows commands, awake, slow to respond ACCESS: R IJ TDC  Imaging: No results found.  Labs: BMET Recent Labs  Lab 11/19/20 0627 11/20/20 0629 11/21/20 0217 11/22/20 0640 11/24/20 0906  NA 144 131* 133* 135 137  K 4.4 4.0 4.4 3.4* 3.8  CL 107 96* 97* 98 99  CO2 23 22 24 22 25   GLUCOSE 97 78 187* 114* 101*  BUN 103* 46* 57* 61* 45*  CREATININE 1.10* 1.19* 1.63* 1.59* 2.15*  CALCIUM 10.0 9.8 10.1 10.5* 10.1  PHOS 5.3*  --   --  7.0* 7.1*   CBC Recent Labs  Lab 11/19/20 1100 11/22/20 0640  11/24/20 0906  WBC 10.4 9.2 12.5*  HGB 8.6* 9.5* 8.9*  HCT 29.8* 31.6* 28.9*  MCV 91.4 89.0 88.7  PLT 352 365 397    Medications:    . (feeding supplement) PROSource Plus  30 mL Oral TID WC  . apixaban  2.5 mg Oral BID  . chlorhexidine gluconate (MEDLINE KIT)  15 mL Mouth Rinse BID  . Chlorhexidine Gluconate Cloth  6 each Topical Daily  . cholecalciferol  2,000 Units Oral Daily  . clonazepam  0.25 mg Oral TID  . collagenase   Topical BID  . darbepoetin (ARANESP) injection - DIALYSIS  100 mcg Intravenous Q Tue-HD  . fentaNYL  1 patch Transdermal Q72H  . guaiFENesin  15 mL Oral Q12H  . heparin sodium (porcine)      . insulin aspart  0-5 Units Subcutaneous QHS  . insulin aspart  0-9 Units Subcutaneous TID WC  . mouth rinse  15 mL Mouth Rinse QID  . metoprolol tartrate  50 mg Oral BID  . midodrine  40 mg Oral TID WC  . multivitamin  1 tablet Oral QHS  . nutrition supplement (JUVEN)  1 packet Oral BID BM  . pantoprazole sodium  40 mg Oral BID  . QUEtiapine  25 mg Oral Daily  . QUEtiapine  50 mg Oral QHS  .  sodium chloride flush  10-40 mL Intracatheter Q12H      Justin Mend, MD 11/24/2020, 10:23 AM

## 2020-11-24 NOTE — Progress Notes (Signed)
Calorie Count Note  Calorie Count ongoing.   Diet: Dysphagia 2, Nectar Thick Supplements: Nepro TID, Pro-Source Plus TID, Magic Cup   1/24 Breakfast: missed due to iHD Lunch: 10% meatload with gravy, 5% cooked broccoli, 50% nectar thick tea, None of the [employer]: No ticket available   1/25:  Breakfast: bites of eggs, potatoes and sausage, 1/4 magic cup, 1/2 nectar thick milk Lunch: no ticket available Dinner: no ticket available  1/26 Breakfast: Missed due to iHD  Noted pt misses 1 meal on dialysis days (Monday, Wednesday, Friday) which contributes to poor intake and reinforces importance of supplementing with oral nutrition supplements  RN reports pt is drinking some of the Nepro, taking some Magic Cup  Total intake: Intake minimal based on recorded po intake, documentation is limited  Goal: Meet >75% of needs  Intervention: -Continue CALORIE COUNT -Continue Nepro Shake po TID between meals, each supplement provides 425 kcal and 19 grams protein -Continue 30 ml ProSource Plus TID with meals, each supplement provides 100 kcals and 15 grams protein.  -Continue Magic cup TID with meals, each supplement provides 290 kcal and 9 grams of protein   -Daughter and pt wanting to hold off on Cortrak today. Continuing to encourage po intake of meals/snacks, supplements.  Recommendations is to insert Cortrak on Friday to meet nutritional needs to promote wound healing and improve nutritional status if po intake remains inadequate.  Ultimately, given significant wound and poor intake, pt would lilkely benefit from surgical G-tube placement to meet nutritional needs   Kerman Passey MS, RDN, LDN, CNSC Registered Dietitian III Clinical Nutrition RD Pager and On-Call Pager Number Located in [place]

## 2020-11-24 NOTE — Progress Notes (Signed)
[facility] Progress Note  84 Days Post-Op  Subjective: CC-  Seen with PT during hydrotherapy.  Objective: Vital signs in last 24 hours: Temp:  [98 F (36.7 C)-99 F (37.2 C)] 98.4 F (36.9 C) (01/26 1122) Pulse Rate:  [89-130] 110 (01/26 1325) Resp:  [17-28] 20 (01/26 1325) BP: (105-142)/(57-92) 105/79 (01/26 1304) SpO2:  [91 %-100 %] 100 % (01/26 1304) FiO2 (%):  [21 %-28 %] 21 % (01/26 1212) Last BM Date: 11/23/20  Intake/Output from previous day: 01/25 0701 - 01/26 0700 In: 10 [I.V.:10] Out: 675 [Urine:350; Stool:325] Intake/Output this shift: Total I/O In: 10 [I.V.:10] Out: 1462 [Other:1462]  PE: Gen: Alert, NAD HEENT:trach in place with PMV Pulm: rate and effort normal Sacral wound: large sacral wound with about 80% nonviable tissue and necrotic tissue around the edges, increasing small amount of healthy granulation tissue, no purulent drainage or cellulitis, no significant tunneling.      Lab Results:  Recent Labs    11/22/20 0640 11/24/20 0906  WBC 9.2 12.5*  HGB 9.5* 8.9*  HCT 31.6* 28.9*  PLT 365 397   BMET Recent Labs    11/22/20 0640 11/24/20 0906  NA 135 137  K 3.4* 3.8  CL 98 99  CO2 22 25  GLUCOSE 114* 101*  BUN 61* 45*  CREATININE 1.59* 2.15*  CALCIUM 10.5* 10.1   PT/INR No results for input(s): LABPROT, INR in the last 72 hours. CMP     Component Value Date/Time   NA 137 11/24/2020 0906   K 3.8 11/24/2020 0906   CL 99 11/24/2020 0906   CO2 25 11/24/2020 0906   GLUCOSE 101 (H) 11/24/2020 0906   BUN 45 (H) 11/24/2020 0906   CREATININE 2.15 (H) 11/24/2020 0906   CALCIUM 10.1 11/24/2020 0906   PROT 8.0 11/12/2020 0124   ALBUMIN 2.5 (L) 11/24/2020 0906   AST 26 11/12/2020 0124   ALT 19 11/12/2020 0124   ALKPHOS 156 (H) 11/12/2020 0124   BILITOT 0.5 11/12/2020 0124   GFRNONAA 24 (L) 11/24/2020 0906   Lipase     Component Value Date/Time   LIPASE 48 08/31/2020 2054       Studies/Results: No results  found.  Anti-infectives: Anti-infectives (From admission, onward)   Start     Dose/Rate Route Frequency Ordered Stop   11/17/20 1415  fluconazole (DIFLUCAN) 40 MG/ML suspension 200 mg        200 mg Oral  Once 11/17/20 1315 11/17/20 1357   11/03/20 2200  meropenem (MERREM) 500 mg in sodium chloride 0.9 % 100 mL IVPB        500 mg 200 mL/hr over 30 Minutes Intravenous Every 24 hours 11/03/20 1500 11/06/20 2217   11/01/20 1000  anidulafungin (ERAXIS) 100 mg in sodium chloride 0.9 % 100 mL IVPB  Status:  Discontinued       "Followed by" Linked Group Details   100 mg 78 mL/hr over 100 Minutes Intravenous Every 24 hours 10/31/20 0916 11/01/20 1404   10/31/20 1015  meropenem (MERREM) 1 g in sodium chloride 0.9 % 100 mL IVPB  Status:  Discontinued        1 g 200 mL/hr over 30 Minutes Intravenous Every 8 hours 10/31/20 0916 11/03/20 1500   10/31/20 1015  linezolid (ZYVOX) IVPB 600 mg  Status:  Discontinued        600 mg 300 mL/hr over 60 Minutes Intravenous Every 12 hours 10/31/20 0916 11/02/20 0906   10/31/20 1015  anidulafungin (ERAXIS) 200 mg  in sodium chloride 0.9 % 200 mL IVPB       "Followed by" Linked Group Details   200 mg 78 mL/hr over 200 Minutes Intravenous  Once 10/31/20 0916 10/31/20 1630   10/25/20 1800  ceFAZolin (ANCEF) IVPB 2g/100 mL premix  Status:  Discontinued        2 g 200 mL/hr over 30 Minutes Intravenous Every 12 hours 10/25/20 1022 10/30/20 1103   10/23/20 2000  vancomycin (VANCOREADY) IVPB 750 mg/150 mL  Status:  Discontinued        750 mg 150 mL/hr over 60 Minutes Intravenous Every 24 hours 10/22/20 2036 10/25/20 1022   10/23/20 1800  piperacillin-tazobactam (ZOSYN) IVPB 3.375 g  Status:  Discontinued        3.375 g 100 mL/hr over 30 Minutes Intravenous Every 6 hours 10/23/20 1155 10/24/20 1422   10/23/20 0200  piperacillin-tazobactam (ZOSYN) IVPB 3.375 g  Status:  Discontinued        3.375 g 100 mL/hr over 30 Minutes Intravenous Every 8 hours 10/22/20 2036  10/23/20 1155   10/22/20 1630  vancomycin (VANCOREADY) IVPB 1500 mg/300 mL        1,500 mg 150 mL/hr over 120 Minutes Intravenous  Once 10/22/20 1537 10/22/20 2229   10/22/20 1630  piperacillin-tazobactam (ZOSYN) IVPB 2.25 g  Status:  Discontinued        2.25 g 100 mL/hr over 30 Minutes Intravenous Every 8 hours 10/22/20 1537 10/22/20 2036   10/22/20 1537  vancomycin variable dose per unstable renal function (pharmacist dosing)  Status:  Discontinued         Does not apply See admin instructions 10/22/20 1537 10/22/20 2036   10/19/20 0600  ceFAZolin (ANCEF) IVPB 2g/100 mL premix        2 g 200 mL/hr over 30 Minutes Intravenous To Radiology 10/18/20 1457 10/19/20 0938   10/10/20 0830  vancomycin (VANCOCIN) IVPB 1000 mg/200 mL premix       "Followed by" Linked Group Details   1,000 mg 200 mL/hr over 60 Minutes Intravenous Every 24 hours 10/09/20 0744 10/15/20 0902   10/09/20 0830  piperacillin-tazobactam (ZOSYN) IVPB 3.375 g        3.375 g 100 mL/hr over 30 Minutes Intravenous Every 6 hours 10/09/20 0744 10/16/20 0038   10/09/20 0830  vancomycin (VANCOREADY) IVPB 2000 mg/400 mL       "Followed by" Linked Group Details   2,000 mg 200 mL/hr over 120 Minutes Intravenous  Once 10/09/20 0744 10/09/20 1022   09/24/20 1000  ceFAZolin (ANCEF) IVPB 2g/100 mL premix  Status:  Discontinued        2 g 200 mL/hr over 30 Minutes Intravenous Every 12 hours 09/24/20 0801 09/24/20 1046   09/23/20 1400  vancomycin (VANCOCIN) IVPB 1000 mg/200 mL premix        1,000 mg 200 mL/hr over 60 Minutes Intravenous Every 24 hours 09/22/20 1436 09/28/20 1718   09/22/20 2200  ceFEPIme (MAXIPIME) 2 g in sodium chloride 0.9 % 100 mL IVPB  Status:  Discontinued        2 g 200 mL/hr over 30 Minutes Intravenous Every 12 hours 09/22/20 1436 09/24/20 0801   09/22/20 1030  ceFEPIme (MAXIPIME) 1 g in sodium chloride 0.9 % 100 mL IVPB        1 g 200 mL/hr over 30 Minutes Intravenous  Once 09/22/20 0934 09/22/20 1145    09/22/20 1015  vancomycin (VANCOCIN) IVPB 1000 mg/200 mL premix  1,000 mg 200 mL/hr over 60 Minutes Intravenous  Once 09/22/20 0934 09/22/20 1446   09/12/20 2200  ceFEPIme (MAXIPIME) 2 g in sodium chloride 0.9 % 100 mL IVPB        2 g 200 mL/hr over 30 Minutes Intravenous Every 12 hours 09/12/20 0732 09/14/20 2134   09/11/20 1400  ceFEPIme (MAXIPIME) 2 g in sodium chloride 0.9 % 100 mL IVPB  Status:  Discontinued        2 g 200 mL/hr over 30 Minutes Intravenous Every 8 hours 09/11/20 1126 09/12/20 0732   09/08/20 1000  vancomycin (VANCOREADY) IVPB 2000 mg/400 mL        2,000 mg 200 mL/hr over 120 Minutes Intravenous  Once 09/08/20 0857 09/08/20 1224   09/08/20 1000  ceFEPIme (MAXIPIME) 2 g in sodium chloride 0.9 % 100 mL IVPB  Status:  Discontinued        2 g 200 mL/hr over 30 Minutes Intravenous Every 12 hours 09/08/20 0857 09/11/20 1126   09/08/20 0856  vancomycin variable dose per unstable renal function (pharmacist dosing)  Status:  Discontinued         Does not apply See admin instructions 09/08/20 0857 09/09/20 0935   09/02/20 1600  cefTRIAXone (ROCEPHIN) 1 g in sodium chloride 0.9 % 100 mL IVPB  Status:  Discontinued        1 g 200 mL/hr over 30 Minutes Intravenous Every 24 hours 09/01/20 1811 09/02/20 0838   09/01/20 1800  fluconazole (DIFLUCAN) IVPB 400 mg        400 mg 50 mL/hr over 240 Minutes Intravenous  Once 09/01/20 1749 09/02/20 0603   09/01/20 1530  piperacillin-tazobactam (ZOSYN) IVPB 3.375 g        3.375 g 12.5 mL/hr over 240 Minutes Intravenous Every 8 hours 09/01/20 1514 09/05/20 2111   09/01/20 1000  levofloxacin (LEVAQUIN) tablet 250 mg  Status:  Discontinued        250 mg Oral Daily 08/31/20 1508 08/31/20 1735   08/31/20 1730  cefTRIAXone (ROCEPHIN) 1 g in sodium chloride 0.9 % 100 mL IVPB  Status:  Discontinued        1 g 200 mL/hr over 30 Minutes Intravenous Every 24 hours 08/31/20 1726 09/01/20 1513   08/31/20 1730  azithromycin (ZITHROMAX) 500 mg in  sodium chloride 0.9 % 250 mL IVPB  Status:  Discontinued        500 mg 250 mL/hr over 60 Minutes Intravenous Every 24 hours 08/31/20 1726 09/02/20 0838       Assessment/Plan HTN HLD DM Hx colon cancer s/p partial colectomy/colostomy followed by completion colectomy and ileostomy Acute respiratory failure due to COVID-19 pneumonia:s/ptracheostomy,Multiple bacterial pneumonias A fib RVR- now on eliquis LE DVT - on eliquis 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 Cortrak became clogged yesterday and calorie count initiated. Will follow up on results.  - No indication for G tube at this time  Sacral wound  -Slow improvement with hydrotherapy and local wound care. Would not recommend taking this patient to the OR for general anesthesia and surgical debridement at this time. Continue Veraflo wound vac per PT, ok to continue changes MWF or could decrease to T/F. Nonviable tissue softening up and seems to be debriding slowly. Once ready for discharge she cannot go home with this  vac. Could either transition back to wet to dry dressing changes, or if she ultimately develops enough healthy granulation tissue could consider applying a regular wound vac. She will need follow up with outpatient wound center.  Pneumoperitoneum,perforated duodenal ulcer S/pexploratory laparotomy, adhesiolysis, modified graham patch repair of perforated duodenal ulcer11/3 Dr. Bobbye Morton -POD#84 -tolerating diet and having bowel function - BID wet to dry dressing changes to midline abdominal wound  ID -currently none FEN -DYS 2 diet VTE -eliquis Foley -none Follow up -Dr. Bobbye Morton   LOS: 34 days    Wellington [place], [facility] 11/24/2020, 2:21 PM Please see  Amion for pager number during day hours 7:00am-4:30pm

## 2020-11-24 NOTE — Progress Notes (Addendum)
PROGRESS NOTE    Candace [name]  DXA:[id number] DOB: 1951-01-23 DOA: 08/31/2020 PCP: Algis Greenhouse, MD   Brief Narrative: LLOS. 77 days in intensive care unit, most of the events are as per hospital records and previous providers documentation.  Patient does have history of colon cancer status post colon resection with colostomy bag, hyperlipidemia, type 2 diabetes, stage III chronic kidney disease.  She was admitted to the hospital and treated for COVID-19 pneumonia and was discharged back home.  Sent to the ER by her home care nurse due to elevated heart rate. 70 year old female who was recently diagnosed with COVID on 08/23/2020 came back to the ER with not feeling well, she was admitted on 08/31/2020 with A. fib with RVR, hemorrhagic shock and found to have perforated duodenal ulcer for that she underwent exploratory laparotomy on 11/3.  She had extensive ICU stay with significant events as below.  11/2 Admitted with rapid Afib 11/3 OR with findings of perforated duodenal ulcer 11/10 progressive hemorrhagic shock, intubated, transfused, pressors, proned; started on CRRT in PM 11/16 Extubated. Re-intubated overnight due to respiratory distress and hypoxia with decreased mentation 11/18 Bronch, cultures sent 11/19 Hgb down getting blood 11/24 Spiked fever, resume empirical antimicrobial therapy 11/26 Hemorrhagic shock, hgb 5.6, increased pressors,  11/30 Per palliative "[name] understanding that patient is unfortunately very fragiledespite ongoing intensive medical treatment and full mechanical support. [name] that the familywantsto continue with all current interventions despite potential outcomes". 12/08 CRRT discontinued due to clotting 12/09 Family requested transfer to tertiary care [facility]). [facility] denied transfer  12/10 CRRT restarted. Episodes of tachycardia, tachypnea that seem to improve with pain management 12/11 Back in shock. Pressor requirements up. CXR worse. ABX  resumed 12/12 Still requiring inc pressors. Had hypoglycemic event. Basal insulin dosing adjusted 12/13 Pressor requirements better. Now hyperglycemic. Re-adjusted Glycemic control  12/19 Afebrile . Remains on dilaudid and heparin gtt, dilaudid gtt increased overnight for concern of pain / ongoing tachycardia, no other events . NE and precedex off 12/17. Ongoing CRRT- even UF, dosing lokelmia this morning 12/20 On CRRT. Renal plans for HD cath and moving to HD. Getting wound care 12/24 - renal stopping CRRT today and plans HD 10/24/20 . 40% fio2 on vent via Trach. TAchypenic and tachycardic. Afebrile but wbc up to 27.6K. On TF. On heparin gtt 12/25 - Back on CRRT. On vent via trach at 40% fio2, On scheduled dilaudid as add on to oxy. Per family request 12/24 - they felt scheduled oxy was not adequate and patient was showing signs of opioid withdrawal.  Patient also had worsening SIRS/sepsis syndrome. Had fever, rising wbc, worsening RR 40-60 and HR 140s sinus->started On abx yesterday. Fever some better today. WBC plateau at 28,.5K. On new levophed ->signifanct improvement in HR 77 and RR t0 20. On heparin gtt. On precedex gtt. On levophed gtt 92mg wthi midodrine. On TF 12/30 Remains on CRRT with intermittent pressor requirements. Ethics consult placed 12/29. Ethics rec time trial of CRRT 12/31 failed SBT with RR 40s. Several conversations between care tam and family, who are upset RE plan of care 1/1 back on pressors  1/4: On pressors, keeping even on CVVHD, HGB drop to 6.9, transfused 1 unit. Improving mental status 1/10 remains on low dose levophed 267m 1/11 off levo, attempting HD with UF for first time. Now tolerating intermittent HD. 1/19 patient transferred to progressive bed with tracheostomy on 35% FiO2.  has NG tube feeding.  No PEG tube.  Receiving dialysis.  Assessment & Plan:   Principal Problem:   Atrial fibrillation with RVR (HCC) Active Problems:   Acute respiratory  failure due to COVID-19 (HCC)   New onset a-fib (HCC)   Leukocytosis   Atrial fibrillation with rapid ventricular response (HCC)   Hypoxia   Pressure injury of skin   Acute hypoxemic respiratory failure (HCC)   ARDS (adult respiratory distress syndrome) (HCC)   Perforated duodenal ulcer ([place])   On mechanically assisted ventilation ([place])   Palliative care by specialist   Goals of care, counseling/discussion   Shock ([place])   Acute and chronic respiratory failure (acute-on-chronic) ([place])   Status post tracheostomy ([place])   ESRD (end stage renal disease) ([place])   Paroxysmal A. fib with RVR: Currently in sinus rhythm.  Continue metoprolol.  Becomes hypotensive in dialysis and is on midodrine.  Cardiology is following him here.  They recommended symptomatic treatment and beta-blocker titration as tolerated.  Acute respiratory secondary to Covid pneumonia: Status post tracheostomy.  Hospital course remarkable for multiple bacterial pneumonias, ventilator dependent respiratory failure.  Completed antibiotic therapy.  Currently on trach collar on 28% FiO2.  Respiratory, PCCM following.  Continue pulmonary toileting, dysphagia therapy, suctioning.  Patient was able to tolerate Passy-Muir valve. PCCM following to see if they can downsize her tracheostomy.  Dysphagia/poor oral intake.  Speech therapy following.  Passed barium swallow evaluation and was started on dysphagia 2 diet.  On calorie count.  Continues to have poor oral intake.  Her Dobbhoff was clogged and was removed on 1/24. plan is to put a new feeding tube if continues to have poor intake.  She might need PEG if she continues to have poor oral intake but as per surgery is not indicated for now and patient is also denying. Surgery following.  Acute metabolic encephalopathy: Continue monitor mental status.  Improved.  Able to follow command.  Be judicious on  sedatives.  On Seroquel  AKI: Nephrology following.  On intermittent dialysis.  On  midodrine for blood pressure support.  Family interested in dialysis at home but not feasible as per nephrology.  Current recommendation is for skilled nursing facility/LTAC level of care.  Social worker, nephrology closely following for this.  Pneumoperitoneum/perforated duodenal ulcer:status post exploratory laparotomy, adhesiolysis, modified Graham patch, repair of perforated ulcer on 11/3 by Dr. Bobbye Morton week.  Tolerating oral diet.  Surgery recommended twice daily wet-to-dry dressing of midline abdominal wound  Hemorrhagic shock: Resolved.  Blood pressure currently stable.  DVT of lower extremity: On Eliquis for anticoagulation.  Anemia of critical illness: Received multiple transfusions during this hospitalization.  Total of 12 units of PRBC.  Currently hemoglobin stable.  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  Also on Seroquel, Klonopin PT/OT recommended skilled nursing facility placement but is very challenging due to need of dialysis, concurrent tracheostomy, pressure ulcer.  Possible LTAC.  As per the daughter, she is  interested on taking her mom to home with home health.  Type 2 diabetes mellitus with hyperglycemia: Episodes of hypoglycemia after extremity feeds.  Continue sliding scale insulin.  Monitor blood sugars  Sacral  decubitus ulcer: Not present on admission.  Has extensive sacral decubitus ulcer.  Wound care following.  Also seen by general surgery.  Recommended against anesthesia/surgical procedure.  Continue wound care.  Currently receiving hydrotherapy  History of stage IV colon cancer: Has colostomy.  Outpatient follow-up recommended           Nutrition  Problem: Increased nutrient needs Etiology: wound healing      DVT prophylaxis:Eliquis Code Status: Full Family Communication: Daughter at bedside Status is: Inpatient  Remains inpatient appropriate  because:Unsafe d/c plan   Dispo: The patient is from: Home              Anticipated d/c is to: LTAC vs Home with home health              Anticipated d/c date is: > 3 days              Patient currently is not medically stable to d/c.   Difficult to place patient Yes      Consultants: Cardiology, surgery, nephrology, ethics, PCCM  Procedures:R PICC 11/5 >> A line 11/9 >> out ETT 11/9 > 11/16, 11/16 >> 09/21/2020, 09/21/2020 tracheostomy>> Lt [place] CVL 11/9 >> R IJ trialysis >> out HD catheter 12/1 >>12/20 12/21 - 14.5 Fr, 23 cm right IJ tunneled hemodialysis catheter placement. Removal of indwelling subclavian catheter.   Antimicrobials:  Anti-infectives (From admission, onward)   Start     Dose/Rate Route Frequency Ordered Stop   11/17/20 1415  fluconazole (DIFLUCAN) 40 MG/ML suspension 200 mg        200 mg Oral  Once 11/17/20 1315 11/17/20 1357   11/03/20 2200  meropenem (MERREM) 500 mg in sodium chloride 0.9 % 100 mL IVPB        500 mg 200 mL/hr over 30 Minutes Intravenous Every 24 hours 11/03/20 1500 11/06/20 2217   11/01/20 1000  anidulafungin (ERAXIS) 100 mg in sodium chloride 0.9 % 100 mL IVPB  Status:  Discontinued       "Followed by" Linked Group Details   100 mg 78 mL/hr over 100 Minutes Intravenous Every 24 hours 10/31/20 0916 11/01/20 1404   10/31/20 1015  meropenem (MERREM) 1 g in sodium chloride 0.9 % 100 mL IVPB  Status:  Discontinued        1 g 200 mL/hr over 30 Minutes Intravenous Every 8 hours 10/31/20 0916 11/03/20 1500   10/31/20 1015  linezolid (ZYVOX) IVPB 600 mg  Status:  Discontinued        600 mg 300 mL/hr over 60 Minutes Intravenous Every 12 hours 10/31/20 0916 11/02/20 0906   10/31/20 1015  anidulafungin (ERAXIS) 200 mg in sodium chloride 0.9 % 200 mL IVPB       "Followed by" Linked Group Details   200 mg 78 mL/hr over 200 Minutes Intravenous  Once 10/31/20 0916 10/31/20 1630   10/25/20 1800  ceFAZolin (ANCEF) IVPB 2g/100 mL premix  Status:   Discontinued        2 g 200 mL/hr over 30 Minutes Intravenous Every 12 hours 10/25/20 1022 10/30/20 1103   10/23/20 2000  vancomycin (VANCOREADY) IVPB 750 mg/150 mL  Status:  Discontinued        750 mg 150 mL/hr over 60 Minutes Intravenous Every 24 hours 10/22/20 2036 10/25/20 1022   10/23/20 1800  piperacillin-tazobactam (ZOSYN) IVPB 3.375 g  Status:  Discontinued        3.375 g 100 mL/hr over 30 Minutes Intravenous Every 6 hours 10/23/20 1155 10/24/20 1422   10/23/20 0200  piperacillin-tazobactam (ZOSYN) IVPB 3.375 g  Status:  Discontinued        3.375 g 100 mL/hr over 30 Minutes Intravenous Every 8 hours 10/22/20 2036 10/23/20 1155   10/22/20 1630  vancomycin (VANCOREADY) IVPB 1500 mg/300 mL        1,500 mg 150  mL/hr over 120 Minutes Intravenous  Once 10/22/20 1537 10/22/20 2229   10/22/20 1630  piperacillin-tazobactam (ZOSYN) IVPB 2.25 g  Status:  Discontinued        2.25 g 100 mL/hr over 30 Minutes Intravenous Every 8 hours 10/22/20 1537 10/22/20 2036   10/22/20 1537  vancomycin variable dose per unstable renal function (pharmacist dosing)  Status:  Discontinued         Does not apply See admin instructions 10/22/20 1537 10/22/20 2036   10/19/20 0600  ceFAZolin (ANCEF) IVPB 2g/100 mL premix        2 g 200 mL/hr over 30 Minutes Intravenous To Radiology 10/18/20 1457 10/19/20 0938   10/10/20 0830  vancomycin (VANCOCIN) IVPB 1000 mg/200 mL premix       "Followed by" Linked Group Details   1,000 mg 200 mL/hr over 60 Minutes Intravenous Every 24 hours 10/09/20 0744 10/15/20 0902   10/09/20 0830  piperacillin-tazobactam (ZOSYN) IVPB 3.375 g        3.375 g 100 mL/hr over 30 Minutes Intravenous Every 6 hours 10/09/20 0744 10/16/20 0038   10/09/20 0830  vancomycin (VANCOREADY) IVPB 2000 mg/400 mL       "Followed by" Linked Group Details   2,000 mg 200 mL/hr over 120 Minutes Intravenous  Once 10/09/20 0744 10/09/20 1022   09/24/20 1000  ceFAZolin (ANCEF) IVPB 2g/100 mL premix  Status:   Discontinued        2 g 200 mL/hr over 30 Minutes Intravenous Every 12 hours 09/24/20 0801 09/24/20 1046   09/23/20 1400  vancomycin (VANCOCIN) IVPB 1000 mg/200 mL premix        1,000 mg 200 mL/hr over 60 Minutes Intravenous Every 24 hours 09/22/20 1436 09/28/20 1718   09/22/20 2200  ceFEPIme (MAXIPIME) 2 g in sodium chloride 0.9 % 100 mL IVPB  Status:  Discontinued        2 g 200 mL/hr over 30 Minutes Intravenous Every 12 hours 09/22/20 1436 09/24/20 0801   09/22/20 1030  ceFEPIme (MAXIPIME) 1 g in sodium chloride 0.9 % 100 mL IVPB        1 g 200 mL/hr over 30 Minutes Intravenous  Once 09/22/20 0934 09/22/20 1145   09/22/20 1015  vancomycin (VANCOCIN) IVPB 1000 mg/200 mL premix        1,000 mg 200 mL/hr over 60 Minutes Intravenous  Once 09/22/20 0934 09/22/20 1446   09/12/20 2200  ceFEPIme (MAXIPIME) 2 g in sodium chloride 0.9 % 100 mL IVPB        2 g 200 mL/hr over 30 Minutes Intravenous Every 12 hours 09/12/20 0732 09/14/20 2134   09/11/20 1400  ceFEPIme (MAXIPIME) 2 g in sodium chloride 0.9 % 100 mL IVPB  Status:  Discontinued        2 g 200 mL/hr over 30 Minutes Intravenous Every 8 hours 09/11/20 1126 09/12/20 0732   09/08/20 1000  vancomycin (VANCOREADY) IVPB 2000 mg/400 mL        2,000 mg 200 mL/hr over 120 Minutes Intravenous  Once 09/08/20 0857 09/08/20 1224   09/08/20 1000  ceFEPIme (MAXIPIME) 2 g in sodium chloride 0.9 % 100 mL IVPB  Status:  Discontinued        2 g 200 mL/hr over 30 Minutes Intravenous Every 12 hours 09/08/20 0857 09/11/20 1126   09/08/20 0856  vancomycin variable dose per unstable renal function (pharmacist dosing)  Status:  Discontinued         Does not apply See admin instructions  09/08/20 0857 09/09/20 0935   09/02/20 1600  cefTRIAXone (ROCEPHIN) 1 g in sodium chloride 0.9 % 100 mL IVPB  Status:  Discontinued        1 g 200 mL/hr over 30 Minutes Intravenous Every 24 hours 09/01/20 1811 09/02/20 0838   09/01/20 1800  fluconazole (DIFLUCAN) IVPB 400 mg         400 mg 50 mL/hr over 240 Minutes Intravenous  Once 09/01/20 1749 09/02/20 0603   09/01/20 1530  piperacillin-tazobactam (ZOSYN) IVPB 3.375 g        3.375 g 12.5 mL/hr over 240 Minutes Intravenous Every 8 hours 09/01/20 1514 09/05/20 2111   09/01/20 1000  levofloxacin (LEVAQUIN) tablet 250 mg  Status:  Discontinued        250 mg Oral Daily 08/31/20 1508 08/31/20 1735   08/31/20 1730  cefTRIAXone (ROCEPHIN) 1 g in sodium chloride 0.9 % 100 mL IVPB  Status:  Discontinued        1 g 200 mL/hr over 30 Minutes Intravenous Every 24 hours 08/31/20 1726 09/01/20 1513   08/31/20 1730  azithromycin (ZITHROMAX) 500 mg in sodium chloride 0.9 % 250 mL IVPB  Status:  Discontinued        500 mg 250 mL/hr over 60 Minutes Intravenous Every 24 hours 08/31/20 1726 09/02/20 0838      Subjective: Patient seen and examined at the bedside this afternoon.  During my evaluation, PT was cleaning her sacral wound.  She looked comfortable.  Denies any complaints.  Daughter was at the bedside.  Objective: Vitals:   11/24/20 1211 11/24/20 1212 11/24/20 1304 11/24/20 1325  BP: 119/80  105/79   Pulse: (!) 128 (!) 130 (!) 119 (!) 110  Resp:  (!) 25 20 20   Temp:      TempSrc:      SpO2:  99% 100%   Weight:      Height:        Intake/Output Summary (Last 24 hours) at 11/24/2020 1354 Last data filed at 11/24/2020 1320 Gross per 24 hour  Intake 10 ml  Output 2062 ml  Net -2052 ml   Filed Weights   11/16/20 1500 11/16/20 1802 11/16/20 2120  Weight: 86 kg 85 kg 92.6 kg    Examination:  General exam: Very deconditioned, debilitated Respiratory system: Bilateral diminished air  sounds, no wheezes or crackles.  Tracheostomy Cardiovascular system: S1 & S2 heard, RRR. No JVD, murmurs, rubs, gallops or clicks. No pedal edema.  Dialysis catheter on the right chest Gastrointestinal system: Abdomen is nondistended, soft and nontender. No organomegaly or masses felt. Normal bowel sounds heard.  Colostomy Central  nervous system: Alert and awake. Extremities: No edema, no clubbing ,no cyanosis Skin: Stage IV sacral ulcer,no icterus ,no pallor   Data Reviewed: I have personally reviewed following labs and imaging studies  CBC: Recent Labs  Lab 11/19/20 1100 11/22/20 0640 11/24/20 0906  WBC 10.4 9.2 12.5*  HGB 8.6* 9.5* 8.9*  HCT 29.8* 31.6* 28.9*  MCV 91.4 89.0 88.7  PLT 352 365 833   Basic Metabolic Panel: Recent Labs  Lab 11/19/20 0627 11/20/20 0629 11/21/20 0217 11/22/20 0640 11/24/20 0906  NA 144 131* 133* 135 137  K 4.4 4.0 4.4 3.4* 3.8  CL 107 96* 97* 98 99  CO2 23 22 24 22 25   GLUCOSE 97 78 187* 114* 101*  BUN 103* 46* 57* 61* 45*  CREATININE 1.10* 1.19* 1.63* 1.59* 2.15*  CALCIUM 10.0 9.8 10.1 10.5* 10.1  MG 1.5*  2.9*  --   --   --   PHOS 5.3*  --   --  7.0* 7.1*   GFR: Estimated Creatinine Clearance: 29.4 mL/min (A) (by C-G formula based on SCr of 2.15 mg/dL (H)). Liver Function Tests: Recent Labs  Lab 11/22/20 0640 11/24/20 0906  ALBUMIN 2.5* 2.5*   No results for input(s): LIPASE, AMYLASE in the last 168 hours. No results for input(s): AMMONIA in the last 168 hours. Coagulation Profile: No results for input(s): INR, PROTIME in the last 168 hours. Cardiac Enzymes: No results for input(s): CKTOTAL, CKMB, CKMBINDEX, TROPONINI in the last 168 hours. BNP (last 3 results) No results for input(s): PROBNP in the last 8760 hours. HbA1C: No results for input(s): HGBA1C in the last 72 hours. CBG: Recent Labs  Lab 11/23/20 0718 11/23/20 1122 11/23/20 1536 11/23/20 2010 11/24/20 1156  GLUCAP 106* 158* 141* 109* 92   Lipid Profile: No results for input(s): CHOL, HDL, LDLCALC, TRIG, CHOLHDL, LDLDIRECT in the last 72 hours. Thyroid Function Tests: No results for input(s): TSH, T4TOTAL, FREET4, T3FREE, THYROIDAB in the last 72 hours. Anemia Panel: No results for input(s): VITAMINB12, FOLATE, FERRITIN, TIBC, IRON, RETICCTPCT in the last 72 hours. Sepsis  Labs: No results for input(s): PROCALCITON, LATICACIDVEN in the last 168 hours.  Recent Results (from the past 240 hour(s))  Culture, respiratory (non-expectorated)     Status: None (Preliminary result)   Collection Time: 11/22/20  4:45 PM   Specimen: Tracheal Aspirate; Respiratory  Result Value Ref Range Status   Specimen Description TRACHEAL ASPIRATE  Final   Special Requests NONE  Final   Gram Stain   Final    ABUNDANT WBC PRESENT,BOTH PMN AND MONONUCLEAR ABUNDANT GRAM POSITIVE COCCI IN PAIRS    Culture   Final    MODERATE STAPHYLOCOCCUS AUREUS SUSCEPTIBILITIES TO FOLLOW Performed at [facility], [place] [address] [place], [place] [address]    Report Status PENDING  Incomplete         Radiology Studies: No results found.      Scheduled Meds: . (feeding supplement) PROSource Plus  30 mL Oral TID WC  . apixaban  2.5 mg Oral BID  . chlorhexidine gluconate (MEDLINE KIT)  15 mL Mouth Rinse BID  . Chlorhexidine Gluconate Cloth  6 each Topical Daily  . cholecalciferol  2,000 Units Oral Daily  . clonazepam  0.25 mg Oral TID  . collagenase   Topical BID  . darbepoetin (ARANESP) injection - DIALYSIS  100 mcg Intravenous Q Tue-HD  . fentaNYL  1 patch Transdermal Q72H  . guaiFENesin  15 mL Oral Q12H  . heparin sodium (porcine)      . insulin aspart  0-5 Units Subcutaneous QHS  . insulin aspart  0-9 Units Subcutaneous TID WC  . mouth rinse  15 mL Mouth Rinse QID  . metoprolol tartrate  50 mg Oral BID  . midodrine  40 mg Oral TID WC  . multivitamin  1 tablet Oral QHS  . nutrition supplement (JUVEN)  1 packet Oral BID BM  . pantoprazole sodium  40 mg Oral BID  . QUEtiapine  25 mg Oral Daily  . QUEtiapine  50 mg Oral QHS  . sodium chloride flush  10-40 mL Intracatheter Q12H   Continuous Infusions: . sodium chloride 5 mL/hr at 11/16/20 0935  . sodium chloride    . albumin human 25 g (11/16/20 1516)     LOS: 84 days    Time spent:35 mins.More than 50% of that  time was spent in counseling and/or coordination of care.      Shelly Coss, MD [facility] P1/26/2022, 1:54 PM

## 2020-11-24 NOTE — Progress Notes (Signed)
Cortrak Tube Team Note:  Consult received to place a Cortrak feeding tube.   Discussed with RD following patient. Per patient's RN patient is more alert today. Plan is to hold off on Cortrak tube placement today and re-assess at next date of service, which is Friday 1/28.  Jacklynn Barnacle, MS, RD, LDN Pager number available on Amion

## 2020-11-24 NOTE — Progress Notes (Signed)
Physical Therapy Wound Treatment Patient Details  Name: Dolores Ewing MRN: [id number] Date of Birth: [birth year]  Today's Date: 11/24/2020 Time: 5625-6389 Time Calculation (min): 116 min  Subjective  Subjective: Pt awake and agreeable. Patient and Family Stated Goals: heal wound Date of Onset:  (unknown) Prior Treatments: prior hydro and Dakins  Pain Score: 2/10  Wound Assessment  Pressure Injury 09/25/20 Sacrum Bilateral;Medial Deep Tissue Pressure Injury - Purple or maroon localized area of discolored intact skin or blood-filled blister due to damage of underlying soft tissue from pressure and/or shear. Purple, non-blanchable, b (Active)  Dressing Type Other (Comment) 11/24/20 1531  Dressing Other (Comment) 11/24/20 1531  Dressing Change Frequency Every other day 11/24/20 1531  State of Healing Early/partial granulation 11/24/20 1531  Site / Wound Assessment Yellow;Red 11/24/20 1531  % Wound base Red or Granulating 10% 11/24/20 1531  % Wound base Yellow/Fibrinous Exudate 80% 11/24/20 1531  % Wound base Black/Eschar 10% 11/24/20 1531  % Wound base Other/Granulation Tissue (Comment) 0% 11/24/20 1531  Peri-wound Assessment Intact 11/24/20 1531  Wound Length (cm) 14.2 cm 11/18/20 1500  Wound Width (cm) 12 cm 11/18/20 1500  Wound Depth (cm) 5.4 cm 11/18/20 1500  Wound Surface Area (cm^2) 170.4 cm^2 11/18/20 1500  Wound Volume (cm^3) 920.16 cm^3 11/18/20 1500  Tunneling (cm) 0 11/19/20 1623  Undermining (cm) 0 11/19/20 1623  Margins Unattached edges (unapproximated) 11/24/20 1531  Drainage Amount Minimal 11/24/20 1531  Drainage Description Serous 11/24/20 1531  Treatment Hydrotherapy (Pulse lavage);Debridement (Selective);Negative pressure wound therapy 11/24/20 1531     Negative Pressure Wound Therapy Sacrum (Active)  Last dressing change 11/24/20 11/24/20 1531  Site / Wound Assessment Yellow;Red 11/24/20 1531  Peri-wound Assessment Intact 11/24/20 1531  Size see above 11/24/20  1531  Wound filler - Black foam 2 11/24/20 1531  Wound filler - White foam 0 11/24/20 1531  Wound filler - Nonadherent 0 11/22/20 1659  Wound filler - Gauze 0 11/22/20 1659  Cycle Continuous 11/24/20 1531  Target Pressure (mmHg) 125 11/24/20 1531  Instillation Volume 26 mL 11/24/20 1531  Instillation Solution Normal Saline 11/24/20 1531  Instillation Soak Time 10 minutes 11/24/20 1531  Instillation Therapy Time 3.5 hours 11/24/20 1531  Canister Changed No 11/24/20 1531  Dressing Status Intact 11/22/20 1659  Drainage Amount Minimal 11/24/20 1531      Hydrotherapy Pulsed lavage therapy - wound location: sacrum Pulsed Lavage with Suction (psi): 12 psi Pulsed Lavage with Suction - Normal Saline Used: 1000 mL Pulsed Lavage Tip: Tip with splash shield Selective Debridement Selective Debridement - Location: sacrum Selective Debridement - Tools Used: Forceps;Scalpel Selective Debridement - Tissue Removed: necrotic adipose, eschar   Wound Assessment and Plan  Wound Therapy - Assess/Plan/Recommendations Wound Therapy - Clinical Statement: PT performed pulsatile lavage, sharp debridement, and reapplied wound vac dressing. Education provided to pt daughter. Pt will continue to benefit from continued hydrotherapy and for selective debridement of unviable tissue in order to decrease bioburden and promote wound bed healing. Wound Therapy - Functional Problem List: Global weakness and immobility Factors Delaying/Impairing Wound Healing: Diabetes Mellitus;Immobility;Multiple medical problems;Other (comment) (poor nutrition) Hydrotherapy Plan: Debridement;Pulsatile lavage with suction;Patient/family education;Dressing change Wound Therapy - Frequency: 6X / week Wound Therapy - Follow Up Recommendations: Skilled nursing facility Wound Plan: see above  Wound Therapy Goals- Improve the function of patient's integumentary system by progressing the wound(s) through the phases of wound healing  (inflammation - proliferation - remodeling) by: Decrease Necrotic Tissue to: 50% Decrease Necrotic Tissue - Progress: Progressing toward goal Increase  Granulation Tissue to: 50% Increase Granulation Tissue - Progress: Progressing toward goal Goals/treatment plan/discharge plan were made with and agreed upon by patient/family: Yes Time For Goal Achievement: 7 days Wound Therapy - Potential for Goals: Fair  Goals will be updated until maximal potential achieved or discharge criteria met.  Discharge criteria: when goals achieved, discharge from hospital, MD decision/surgical intervention, no progress towards goals, refusal/missing three consecutive treatments without notification or medical reason.  GP     Brown, PT, DPT Acute Rehabilitation Services Pager [phone] Office [phone]   Carloine T Brown 11/24/2020, 5:43 PM   

## 2020-11-24 NOTE — TOC Progression Note (Addendum)
Transition of Care (TOC) - Progression Note    Patient Details  Name: Candace [name] MRN: [id number] Date of Birth: 02/05/1951  Transition of Care (TOC) CM/SW Contact   R Stubbldfield, RN Phone Number: 11/24/2020, 11:30 AM  Clinical Narrative:    Case management met the patient and daughter at the bedside regarding transitions of care needs.  The daughter states that she was hopeful that the patient's trach would be downsized soon.  I updated the daughter, [name], regarding Dr. Stanford's recommendations that the patient was not a safe candidate for home hemodialysis at this time.  The daughter states that the PACE program would not be of benefit to the patient at this point considering the current medical coverage, home health services and family support that the patient has at this time.  The daughter is hopeful and states that she is looking forward to her mom returning home with home health services once she was medically stable to return home.  I spoke with Jennifer, CM at [facility] and she plans to review the patient's clinicals and speak to her director regarding possible admission for LTAC.  I offered to the daughter the opportunity for LTAC placement if she is a candidate for [facility] and the daughter declined at this point and would like the opportunity to send the patient home with home health as soon as she is able.  CM and MSW will continue to follow the patient for transition of care needs.   Expected Discharge Plan: Home w Home Health Services Barriers to Discharge: Continued Medical Work up  Expected Discharge Plan and Services Expected Discharge Plan: Home w Home Health Services In-house Referral: Clinical Social Work Discharge Planning Services: CM Consult Post Acute Care Choice: Durable Medical Equipment,Home Health,Dialysis Living arrangements for the past 2 months: Single Family Home                 DME Arranged: N/A DME Agency:  NA       HH Arranged: NA HH Agency: NA         Social Determinants of Health (SDOH) Interventions    Readmission Risk Interventions Readmission Risk Prevention Plan 11/19/2020  Transportation Screening Complete  Medication Review (RN Care Manager) Complete  PCP or Specialist appointment within 3-5 days of discharge Complete  HRI or Home Care Consult Complete  SW Recovery Care/Counseling Consult Complete  Palliative Care Screening Complete  Skilled Nursing Facility Complete  Some recent data might be hidden   

## 2020-11-24 NOTE — Progress Notes (Signed)
Hydrotherapy - late entry for [facility] dressing for 11/22/20      11/22/20 1659  Negative Pressure Wound Therapy Sacrum  Placement Date/Time: 11/22/20 1500   Wound Type: (c) Other (Comment)  Location: Sacrum  Last dressing change 11/22/20  Site / Wound Assessment Granulation tissue;Pale;Yellow  Peri-wound Assessment Intact  Size see above  Wound filler - Black foam 2 (gray foam)  Wound filler - White foam 0  Wound filler - Nonadherent 0  Wound filler - Gauze 0  Cycle Continuous  Target Pressure (mmHg) 125  Instillation Volume 26 mL  Instillation Solution Normal Saline  Instillation Soak Time 10 minutes  Instillation Therapy Time 3.5 hours  Canister Changed No  Dressing Status Intact  Drainage Amount None  11/24/2020 Margie, PT Acute Rehabilitation Services Pager:  [phone] Office:  [phone]

## 2020-11-24 NOTE — Progress Notes (Signed)
RT came by to asses pts trach, but pt was off floor in HD. RT will check back later.

## 2020-11-24 NOTE — Progress Notes (Signed)
NAMEKashlyn [name], MRN:  [id number], DOB:  Mar 10, 1951, LOS: 90 ADMISSION DATE:  08/31/2020, CONSULTATION DATE:  09/07/2020 REFERRING MD:  Dr Candiss Norse, CHIEF COMPLAINT:  Acute resp failure  Brief History   70 year old female who was previously diagnosed with Covid 08/23/2020.  Admitted 11/2 with AF-RVR, found to have a perforated duodenal underwent exploratory laparotomy with Phillip Heal patch placement 11/3.  Past Medical History  Covid pneumonia Atrial fibrillation CKD stage III Diabetes mellitus Hypertension Colon cancer Hyperlipidemia  Significant Hospital Events   11/2 Admitted  11/3 OR with findings of perforated duodenal ulcer 11/10 progressive hemorrhagic shock, intubated, transfused, pressors, proned; started on CRRT in PM 11/03 Exploratory laparotomy, Phillip Heal patch, lysis of adhesion for duodenal ulceration postop day 6 11/16 Extubated. Re-intubated overnight due to respiratory distress and hypoxia with decreased mentation 11/18 Bronch, cultures sent 11/19 Hgb down getting blood 11/24 Spiked fever resume empirical antimicrobial therapy 11/26 Hemorrhagic shock, hgb 5.6, increased pressors, CT A/P  11/30 Per palliative "[name] expresses understanding that patient is unfortunately very fragile despite ongoing intensive medical treatment and full mechanical support. She indicates that the family wants to continue with all current interventions despite potential outcomes". 12/08 CRRT discontinued due to clotting 12/09 Family requested transfer to tertiary care [facility]). [facility] denied transfer  12/10 CRRT restarted. Episodes of tachycardia, tachypnea that seem to improve with pain management 12/11 Back in shock. Pressor requirements up. CXR worse. ABX resumed 12/12 Still requiring inc pressors. Had hypoglycemic event. Basal insulin dosing adjusted 12/13 Pressor requirements better. Now hyperglycemic. Re-adjusted Glycemic control  12/14 Changed dilaudid to1/2 dosing from day further. D/c  vasopressin.  Goals of care reconfirmed with daughter.  Patient continues to desire aggressive care.  Not open to discussing any other option, patient family continues to be hopeful that she will be discharged to home with full recovery in spite of multiple attempts by staff to prepare them that this is unlikely scenario 12/15 Dilaudid discontinued sending cortisol for ongoing pressor dependence 12/16 Serum cortisol <20, added stress dose steroids.  PRN Dilaudid, attempting not go back on Dilaudid infusion 12/17 Developed worsening tachycardia during the evening hours received initially IV albumin, followed by resuming IV Dilaudid with question of suboptimal pain control.  Currently looks better back on Dilaudid drip.  We have been able to wean pressors after adding stress dose steroids; near arrest - bradycardia, better with atropine  12/19 Afebrile . Remains on dilaudid and heparin gtt, dilaudid gtt increased overnight for concern of pain / ongoing tachycardia, no other events . NE and precedex off 12/17. Ongoing CRRT- even UF, dosing lokelmia this morning 12/20 On CRRT.  Renal plans for HD cath and moving to HD. Getting wound care  12/23 -No vent weaning per RT, remains on full support, #8 trach in place but previously had #6, no documentation of change noted in chart Afebrile / WBC 20.4  Vent - 30% FiO2, PEEP 5 Glucose range 176-212 I/O 465 ml stool, 3.6L removed with HD, -1.1L in last 24 hours  RN reports ongoing periods of tachypnea / vent dyssynchrony that responds to dilaudid   12/24 - renal stopping CRRT today and plans HD 10/24/20 . 40% fio2 on vent via Trach. TAchypenic and tachycardic. Afebrile but wbc up to 27.6K. On TF. Onn heparin gtt  12/25 - Back on CRRT. On vent via trach at 40% fio2, On scheduled dilaudid as add on to oxy. Per family request 12/24 - they felt scheduled oxy was not adequate and patient was showing  signs of opioid withdrawal.  Patient also had  worsening SIRS/sepsis  syndrome. Had fever, rising wbc, worsening RR 40-60 and HR 140s sinus-> started On abx yesterday. Fever some better today. WBC plateau at 28,.5K. On new levophed -> signifanct improvement in HR 77 and RR t0 20. On heparin gtt.  On precedex gtt. On levophed gtt 52mcg wthi midodrine. On TF 12/30 Remains on CRRT with intermittent pressor requirements. Ethics consult placed evening of 12/29. Ethics rec time trial of CRRT 12/31 failed SBT with RR 40s. Several conversations between care tam and family, who are upset RE plan of care 1/1 back on pressors  1/4: On pressors, keeping even on CVVHD, HGB drop to 6.9, transfused 1 unit. Improving mental status 1/10 remains on low dose levophed 67mcg 1/11 off levo, attempting HD with UF for first time.  -ethics consulted 12/30-- recs for time limited trial of CRRT, please see separate Ethics consult note for full details. Futility policy reviewed with family by ethics. Ongoing meetings with Dr. Hulen Skains with nursing administration and family. The ultimate goal is to transition her safely to HD from CRRT. 11/16/2020 again will be placed on intermittent hemodialysis. 11/16/2018 wheeze criteria for transition to progressive care since she has been off the ventilator for 72 hours. With an order for transfer to progressive care. 11/18/2020 is doing well on progressive care unit.  Consults:  Cardiology [facility] Nephrology  Ethics  Procedures:  R PICC 11/5 >> A line 11/9 >> out ETT 11/9 > 11/16, 11/16 >> 09/21/2020, 09/21/2020 tracheostomy>> Lt [place] CVL 11/9 >> R IJ trialysis >> out HD catheter 12/1 >>12/20 12/21 - 14.5 Fr, 23 cm right IJ tunneled hemodialysis catheter placement.  Removal of indwelling subclavian catheter.   Significant Diagnostic Tests:  11/3 CT abd/ pelvis > 1. Positive for bowel perforation: Pneumoperitoneum and intermediate density free fluid in the abdomen. Prior total colectomy. The specific site of perforation is unclear-oral contrast present to  the proximal jejunum has not obviously leaked. Note that there may be small bowel loops adherent to the ventral abdominal wall along the greater curve of the stomach. 2. Extensive bilateral lower lung pneumonia. No pleural effusion. 3. Other abdominal and pelvic viscera are stable since 2015, including bilateral adrenal adenomas. Chronic renal parapelvic cysts. 4. Aortic Atherosclerosis 11/3 TTE > EF 70-75%, RV not well visualized, mildly reduced RV systolic function 09/32 CT chest/ abd/ pelvis> 1. Interval progression of diffuse bilateral hazy ground-glass airspace opacities with more focal areas of consolidation at the lung bases 2. Trace bilateral pleural effusions. 3. Postsurgical changes the abdomen as detailed above. No evidence for a postoperative abscess, however evaluation is limited by lack of IV contrast. 4. There is a 1.9 cm cystic appearing lesion located in the pancreatic body. This was not present on the patient's CT from 2015.  Follow-up with an outpatient contrast enhanced MRI is recommended. 5. The endometrial stripe appears diffusely thickened. Follow-up with pelvic ultrasound is recommended. Aortic Atherosclerosis 11/14 LE doppler studies > + DVT of right posterior tibial and peroneal vein, +dVT of left posterior tibial vein  11/26 CT ABD PEL > liver unremarkable, distended gallbladder with layering tiny gallstones versus sludge, no duct dilatation, mild hyperdensity right upper pole renal collecting system new.  No evidence of retroperitoneal bleeding.  Multifocal lower lobe predominant pulmonary infiltrates/pneumonia.  Small left pleural effusion. 1/16 Venous duplex RUE >> negative Micro Data:  11/10 MRSA PCR > neg 11/10 BC x 2 > neg 09/22/2020 blood cultures x2>> S epi.  09/22/2020 sputum culture>> MRSA Blood 12/1 >> negative. S.epi 1 out of 2, likely contaminant BCx 2 12/12 >> neg xxx Trach aspirate 12/24 - FEW STAPHYLOCOCCUS AUREUS  Blood 12/24 > Negative  Blood  1/2: neg Sputum 1/4: MSSA  Antimicrobials:  azithro 11/2 >11/3 Ceftriaxone 11/2  Fluconazole 11/3 Zosyn 11/3 >> 11/7 Vanc 11/10 off Cefepime 11/10 > 11/16 09/22/2020 vancomycin for MRSA PNA >> 12/2 xxxx Vanc 12/11> 12/17 Zosyn 12/11> 12/17 xxxx vanc 12/24 (staph resp cutlure) >> 12/28 Zosyn 12/24 - 12/27 Cefazolin 12/27 >>1/1 linezolid 1/2 eraxis 1/2  Meropenem 1/2 > 1/8  Subjective:   Remains chronic critically ill On trach collar 28%, 5 L Afebrile Secretions decreased  Objective   Blood pressure 105/79, pulse 100, temperature 98.4 F (36.9 C), temperature source Oral, resp. rate (!) 23, height 5\' 8"  (1.727 m), weight 92.6 kg, SpO2 98 %.    FiO2 (%):  [21 %-28 %] 21 %   Intake/Output Summary (Last 24 hours) at 11/24/2020 1620 Last data filed at 11/24/2020 1531 Gross per 24 hour  Intake 36 ml  Output 1912 ml  Net -1876 ml   Filed Weights   11/16/20 1500 11/16/20 1802 11/16/20 2120  Weight: 86 kg 85 kg 92.6 kg   Physical Exam  Awake but frail appearing elderly woman Tracheostomy #8, PM valve Chest  Bl decreased air entry, no accessory muscle use Cardiac S1-S2 tachy Abdomen soft, nontender Extremities are warm and dry   .  Labs show nml lytes, mild leucocytosis Chest x-ray 1/15 independently reviewed shows stable bibasal infiltrates   Resolved problems:  Previously hemorrhagic shock requiring PRBCs, Septic shock, MRSA pneumonia s/p 8 days abx Complicated by MRSA healthcare associated pneumonia (treated) Peritonitis and perforated ulcer post repair 09/01/20 MSSA HAP Vent dependency   Assessment:  Latanga Nedrow is a 70 y.o. with history of COVID 19 infection requiring hospitalization in October 2021 with current admission for hemorrhagic shock secondary to perforated gastric ulcer requiring surgical intervention and [place] Field. Her medical issues are as follows:  Acute metabolic encephalopathy : Prolonged critical illness,  sedation/pain/anxiety -Can taper Seroquel and clonazepam to off - will dc daytime seroquel & decrease klonopin to bid (daughter concerned that anxiety meds are keeping her sedate)   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 status post tracheostomy Stable bilateral pneumonia VDRF Tolerating Passy-Muir valve Will downsize to #6 Shiley now that secretions decreased,  respiratory culture again showing staph, expect MSSA,check CXR -if new infx, will treat otherwise observe   Persistent SVT, sinus tachycardia -Continue metoprolol  AKI Lab Results  Component Value Date   CREATININE 2.15 (H) 11/24/2020   CREATININE 1.59 (H) 11/22/2020   CREATININE 1.63 (H) 11/21/2020    HD Per nephrology  Afib/DVT lower extremity Asymmetric right upper extremity edema/swelling negative ultrasound Per primary    Critical Illness Myopathy Severe deconditioning  DM2 with hyperglycemia CBG (last 3)  Recent Labs    11/23/20 1536 11/23/20 2010 11/24/20 1156  GLUCAP 141* 109* 92   Stage IV sacral ulcer Per primary , general surgery following  History of stage IV colon cancer 1.9 cm cystic-appearing lesion located in pancreatic body no intervention at this time.   Will need a trach/dialysis facility, daughter optimistic about going home. Can consider decannulation if pt rehabs well Will see again next week - please call with questions   Best practice:  Diet: EN 11/18/2020 Was progressed to p.o. diet after passing swallow evaluation Pain/Anxiety/Delirium protocol (if indicated):  Fentanyl patch VAP protocol (if indicated): yes DVT prophylaxis: Eliquis GI prophylaxis: Protonix Glucose control: SSI, TF coverage, lantus Mobility: PT Family communication: Updated daughter at bedside   Kara Mead MD. FCCP. [facility] See Amion for pager  If no response to pager , please call [phone]  After 7:00 pm call Elink  [phone]     11/24/2020, 4:20 PM

## 2020-11-24 NOTE — Progress Notes (Signed)
Renal Navigator has been asked by CM/M. Stubblefield to inquire about the possibility of home hemodialysis for patient at discharge. Navigator spoke with Medical Director of Home Therapies Program/Dr. Joelyn Oms to discuss. Dr. Joelyn Oms is familiar with patient from proving care inpatient. He reviewed current records and states that due to patient's trach and current functional status, as well as current recommendations for SNF/LTACH level care at discharge, patient is not a candidate for home dialysis at this time. At this time, patient does not qualify for SNF in this area, as there are no SNFs who offer HD on site and no outpatient HD clinics where patient will be accepted with a trach that requires suctioning. Patient would also need to be able to tolerate sitting for 4-5 hours per treatment in order to rehabilitate in a SNF to go to outpatient HD and then transition to home hemodialysis.  Please contact Renal Navigator with any further questions.  Alphonzo Cruise, [place] Renal Navigator [phone]

## 2020-11-24 NOTE — Progress Notes (Signed)
SLP Cancellation Note  Patient Details Name: Dagmar Adcox MRN: [id number] DOB: 05/27/1951   Cancelled treatment:       Reason Eval/Treat Not Completed: Patient at procedure or test/unavailable. Pt in HD this am, will f/u tomorrow.    Jameil Whitmoyer, Katherene Ponto 11/24/2020, 8:04 AM

## 2020-11-25 ENCOUNTER — Inpatient Hospital Stay (HOSPITAL_COMMUNITY): Payer: Medicare Other

## 2020-11-25 DIAGNOSIS — I4891 Unspecified atrial fibrillation: Secondary | ICD-10-CM | POA: Diagnosis not present

## 2020-11-25 LAB — CBC WITH DIFFERENTIAL/PLATELET
Abs Immature Granulocytes: 0.1 10*3/uL — ABNORMAL HIGH (ref 0.00–0.07)
Basophils Absolute: 0.1 10*3/uL (ref 0.0–0.1)
Basophils Relative: 1 %
Eosinophils Absolute: 0.1 10*3/uL (ref 0.0–0.5)
Eosinophils Relative: 1 %
HCT: 33.4 % — ABNORMAL LOW (ref 36.0–46.0)
Hemoglobin: 10 g/dL — ABNORMAL LOW (ref 12.0–15.0)
Immature Granulocytes: 1 %
Lymphocytes Relative: 22 %
Lymphs Abs: 3.4 10*3/uL (ref 0.7–4.0)
MCH: 26.5 pg (ref 26.0–34.0)
MCHC: 29.9 g/dL — ABNORMAL LOW (ref 30.0–36.0)
MCV: 88.4 fL (ref 80.0–100.0)
Monocytes Absolute: 1.7 10*3/uL — ABNORMAL HIGH (ref 0.1–1.0)
Monocytes Relative: 11 %
Neutro Abs: 10 10*3/uL — ABNORMAL HIGH (ref 1.7–7.7)
Neutrophils Relative %: 64 %
Platelets: 386 10*3/uL (ref 150–400)
RBC: 3.78 MIL/uL — ABNORMAL LOW (ref 3.87–5.11)
RDW: 16 % — ABNORMAL HIGH (ref 11.5–15.5)
WBC: 15.4 10*3/uL — ABNORMAL HIGH (ref 4.0–10.5)
nRBC: 0 % (ref 0.0–0.2)

## 2020-11-25 LAB — BASIC METABOLIC PANEL
Anion gap: 13 (ref 5–15)
BUN: 24 mg/dL — ABNORMAL HIGH (ref 8–23)
CO2: 24 mmol/L (ref 22–32)
Calcium: 10.6 mg/dL — ABNORMAL HIGH (ref 8.9–10.3)
Chloride: 98 mmol/L (ref 98–111)
Creatinine, Ser: 1.86 mg/dL — ABNORMAL HIGH (ref 0.44–1.00)
GFR, Estimated: 29 mL/min — ABNORMAL LOW (ref >60)
Glucose, Bld: 133 mg/dL — ABNORMAL HIGH (ref 70–99)
Potassium: 4.9 mmol/L (ref 3.5–5.1)
Sodium: 135 mmol/L (ref 135–145)

## 2020-11-25 LAB — CULTURE, RESPIRATORY W GRAM STAIN

## 2020-11-25 LAB — GLUCOSE, CAPILLARY
Glucose-Capillary: 119 mg/dL — ABNORMAL HIGH (ref 70–99)
Glucose-Capillary: 140 mg/dL — ABNORMAL HIGH (ref 70–99)
Glucose-Capillary: 143 mg/dL — ABNORMAL HIGH (ref 70–99)
Glucose-Capillary: 109 mg/dL — ABNORMAL HIGH (ref 70–99)

## 2020-11-25 MED ORDER — MEGESTROL ACETATE 400 MG/10ML PO SUSP: 400.0000 mg | Freq: Four times a day (QID) | ORAL | Status: DC

## 2020-11-25 MED ORDER — FENTANYL 25 MCG/HR TD PT72
1.0000 | MEDICATED_PATCH | TRANSDERMAL | Status: DC
Start: 2020-11-25 — End: 2020-11-25

## 2020-11-25 MED ORDER — MEGESTROL ACETATE 400 MG/10ML PO SUSP
200.0000 mg | Freq: Four times a day (QID) | ORAL | Status: DC
  Administered 2020-11-25 – 2020-12-23 (×102): 200 mg via ORAL
  Filled 2020-11-25: qty 5
  Filled 2020-11-25 (×12): qty 10
  Filled 2020-11-25: qty 5
  Filled 2020-11-25 (×2): qty 10
  Filled 2020-11-25: qty 5
  Filled 2020-11-25 (×81): qty 10
  Filled 2020-11-25: qty 5
  Filled 2020-11-25 (×4): qty 10
  Filled 2020-11-25: qty 5
  Filled 2020-11-25 (×13): qty 10

## 2020-11-25 MED ORDER — QUETIAPINE FUMARATE 25 MG PO TABS
12.5000 mg | ORAL_TABLET | Freq: Every day | ORAL | Status: AC
  Administered 2020-11-25 – 2020-11-27 (×3): 12.5 mg via ORAL
  Filled 2020-11-25 (×3): qty 1

## 2020-11-25 MED ORDER — FENTANYL 25 MCG/HR TD PT72
1.0000 | MEDICATED_PATCH | TRANSDERMAL | Status: DC
  Administered 2020-11-25 – 2020-11-28 (×2): 1 via TRANSDERMAL
  Filled 2020-11-25 (×2): qty 1

## 2020-11-25 MED ORDER — CLONAZEPAM 0.25 MG PO TBDP
0.2500 mg | ORAL_TABLET | Freq: Two times a day (BID) | ORAL | Status: DC
  Administered 2020-11-25 – 2020-11-26 (×2): 0.25 mg via ORAL
  Filled 2020-11-25 (×2): qty 1

## 2020-11-25 NOTE — Progress Notes (Signed)
Brief Nutrition Follow-up:  Long discussion with pt's daughter and patient. Poor po intake persists, pt is not meeting nutritional needs via oral route. On visit today, breakfast meal tray at bedside and untouched. Pt has been eating bites at meal times. Asked daughter if she has brought in outside food that pt might be eating and daughter stated she has not. Pt has a variety of oral nutrition supplements in place (Nepro, [employer], Pro-Source Plus). Pt does not dislike the Nepro and the Magic Cup and is amenable to taking but is not taking in very much of these. Pt also with order for Pro-source Plus protein modular but daughter unsure if pt has received this. Spoke with RN who plans to bring one to patient at lunch today.  Daughter is aware that pt's nutritional needs are not being met via oral intake alone and pt will likely be unable to meet the increased nutritional demand related to wound healing while taking into account nutritional losses from HD and wound VAC. Daughter is agreeable to reinsertion of Cortrak at this time with initiation of nocturnal TF.   Also discussed ultimate long term nutrition poc with patient and pt's daughter, including consideration of G-tube placement if this is at all surgically possible. I think it highly unlikely that sacral pressure injury will heal Daughter with many appropriate questions which were answered to the best of my ability.  1) Can the patient still eat with a G-tube in place? YES 2) Can the G-tube be removed down the road if pt no longer requires? YES 3) Can the G-tube be used to meet hydration needs as needed? YES  RD to discuss possible G-tube consideration with MD team  Daughter concerned re: multiple medications possibly affection her mother's po intake. RD explained that medications can have multiple side effects and decreased appetite/anorexia can definitely be one of them. Discussed possibility of appetite stimulant with daughter and she was  amenable to trying this. Plan to discuss with MD as well   Interventions/Plan:   Plan to reinsert Cortrak tube tomorrow (Friday 1/28) with re-initiation of nocturnal TF  Plan for Nepro at 65 ml/hr x 12 hours with Pro-Source TF 45 mL TID providing 1524 kcals, 96 g of protein and 570 mL of free water  Recommend trial of appetite stimulant, discussed with MD  Recommend re-visiting surgical G-tube placement at this time, discussed with Primary MD ans Surgical Team  Continue Nepro Shake po TID, each supplement provides 425 kcal and 19 grams protein  Continue Magic cup TID with meals, each supplement provides 290 kcal and 9 grams of protein  Continue renal MVI daily  Kerman Passey MS, RDN, LDN, CNSC Registered Dietitian III Clinical Nutrition RD Pager and On-Call Pager Number Located in [place]

## 2020-11-25 NOTE — Progress Notes (Signed)
  Speech Language Pathology Treatment: Dysphagia;[place] Speaking valve  Patient Details Name: Candace [name] MRN: [id number] DOB: 1951/08/26 Today's Date: 11/25/2020 Time: 1350-1400 SLP Time Calculation (min) (ACUTE ONLY): 10 min  Assessment / Plan / Recommendation Clinical Impression  Pt seen very briefly. Recent trach change to 6 cuffed trach from 8. Discussed with Dr. Elsworth Soho that im hopeful for a quick change to a cuffless trach to assist in breath support for phonation. Pt able to achieve very minimal phonation with cues today if she takes a deep breath and pushes past trach. Trialed small sips of thin water given minimal pharyngeal impairment on MBS with thins. Will trial thin water and otherwise nectar thick liquids over the weekend and likely advance further next week.   HPI        SLP Plan          Recommendations  Diet recommendations: Thin liquid;Dysphagia 2 (fine chop) Liquids provided via: Cup;Straw Medication Administration: Crushed with puree Supervision: Staff to assist with self feeding;Full supervision/cueing for compensatory strategies Compensations: Slow rate;Small sips/bites                Oral Care Recommendations: Oral care BID Follow up Recommendations: 24 hour supervision/assistance;Home health SLP SLP Visit Diagnosis: Dysphagia, unspecified (R13.10);Aphonia (R49.1)       GO               Herbie Baltimore, MA CCC-SLP  Acute Rehabilitation Services Pager [phone] Office [phone]  [name] 11/25/2020, 2:53 PM

## 2020-11-25 NOTE — Progress Notes (Signed)
Nursing leadership in collaboration with medical team discussed plan with family to support the progression of this patient. Patients' daughter expressed the plan and desire for family to care for patient at home upon discharge. Leadership has requested the names of 2 family members that will participating in the care of the patient at home. The daughter will converse with family and provide nursing leadership with the names of these 2 individuals. Once we have this information we will plan to create a schedule with the family to have each come in at a designated time to observe the care of the patient and answer any questions at that time. Leadership will support the primary nurse according to this schedule by making sure the nurse is available and prepared at the designated time. We discussed some of what we will focus on. Family was not sure if we would be "training" the family. I did explain that we do not have the time or resources to train however we can allow them to observe and possibly do some return demonstration as we get closer to discharge. That is the plan as of now. Daughter was very appreciative of this plan.

## 2020-11-25 NOTE — TOC Progression Note (Signed)
Transition of Care [facility]) - Progression Note    Patient Details  Name: Candace [name] MRN: [id number] Date of Birth: 1951-01-29  [facility] CM/SW Contact  [name], RN Phone Number: 11/25/2020, 12:39 PM  Clinical Narrative:    Case management met with the patient and daughter at the bedside regarding discussion about transitions of care.  The patient's daughter explained that she would like to have patient in a specialty bed that would allow the patient to sit up more upright and explained this to [name], NP - orders noted for bed change.  The daughter declined LTAC placement or placement in a SNF facility and would like to be able to eventually take her mom home with home health services.  The patient was living in her own home in [place] with a son and daughter that are both certified nursing assistants.  The daughter explained that with the hospital visitation policy at this time - she was unable to have multiple family members visit in the hospital and learn how to provide care.  [name], NP will speak with the nursing director and make arrangements so that family can begin to assist with care so that appropriate care can be provided when the patient is able to safely return home with home health services as support.  CM and MSW with Difficult to Place Team with [facility] will continue to stay in communication with the patient and family as the patient progresses.  At this time, the patient and daughter would like the patient to be discharged home with home health services and dme once the patient is able to safely do so - barriers at this point include patient's need for intermittant hemodialysis, trach care and respiratory support, wound care on sacrum involving wound vac, and nutrition through possible replacement of cortrak or possible need for G-tube.  The patient is not a candidate for home hemodialysis at this time and the HD facilities in the patient's area  are unable to provide trach/dialysis support along with patient's inability to transfer to wheelchair and sit up for treatment at the facility.  TOC Difficult to Place Team will continue to follow the patient for transition of care needs.   Expected Discharge Plan: [place] Barriers to Discharge: Continued Medical Work up  Expected Discharge Plan and Services Expected Discharge Plan: [place] In-house Referral: Clinical Social Work Discharge Planning Services: CM Consult Post Acute Care Choice: Durable Medical Equipment,Home Health,Dialysis Living arrangements for the past 2 months: Single Family Home                 DME Arranged: N/A DME Agency: NA       HH Arranged: NA HH Agency: NA         Social Determinants of Health (SDOH) Interventions    Readmission Risk Interventions Readmission Risk Prevention Plan 11/19/2020  Transportation Screening Complete  Medication Review [employer]) Complete  PCP or Specialist appointment within 3-5 days of discharge Complete  HRI or Home Care Consult Complete  SW Recovery Care/Counseling Consult Complete  Palliative Care Screening Complete  Skilled Nursing Facility Complete  Some recent data might be hidden

## 2020-11-25 NOTE — Progress Notes (Signed)
[facility] Progress Note     Assessment:   1. HD dep't AKI.CRRT stopped 11/03/2020.[place] placed on 10/19/20. Family desired ongoing full scope of care, Now on IHD 2. SVT 3. Hyperkalemia; stable 4. H/o mixed shock: hemorrhagic and septic shock; stable 5. Anemia;  on ESA 6. Perforated duodenal ulcer s/p exlap and graham patch placement 7. 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 ARDS 2/2 COVID-19 Pneumonia 9. A.fib/ DVT 10. Severe calorie malnutrition 11. H/o stage IV colon cancer 12. Hypercalcemia- immobility, stable 13. Metabolic Acidosis, resolved  PLAN: - HD next tomorrow.   Continue current medications. Complex dispo planning noted.  Discussed with daughter today - she is ok with outpt in center HD and transition to home dialysis later.   Subjective:   No new event.  Lying in bed on HD.  Alert awake and following commands. UOP 151m, still with intradialytic Cr rise suggestive of need for dialysis. Daughter bedside today.    Objective:   BP 130/83 (BP Location: Left Arm)   Pulse (!) 119   Temp 98.8 F (37.1 C) (Oral)   Resp 20   Ht _0  (1.727 m)   Wt 92.6 kg   SpO2 98%   BMI 31.04 kg/m   Intake/Output Summary (Last 24 hours) at 11/25/2020 1148 Last data filed at 11/25/2020 1000 Gross per 24 hour  Intake 36 ml  Output 250 ml  Net -214 ml   Weight change:   Physical Exam: Gen: Chronically ill looking female lying on bed comfortable HEENT: trach in place CVS: tachy, S1-S2 normal Resp: unlabored, trach; scattered ant rhonchi Abd: soft, nontender Ext:  No edema appreciated Neuro: follows commands, awake, slow to respond [facility] DG CHEST PORT 1 VIEW  Result Date: 11/25/2020 CLINICAL DATA:  70year old female with respiratory failure. COVID-19. EXAM: PORTABLE CHEST 1 VIEW COMPARISON:  Portable chest 11/13/2020 and earlier. FINDINGS: Portable AP semi upright view at 0345 hours. Satisfactory tracheostomy tube. Stable dual lumen  right chest catheter. Superimposed right PICC line is stable. Enteric feeding tube has been removed. Stable low lung volumes with coarse and patchy lower lung opacity. Stable cardiac size and mediastinal contours. No pneumothorax, pulmonary edema or pleural effusion. Ventilation has mildly improved at both lung bases. Paucity of bowel gas in the upper abdomen. Stable visualized osseous structures. IMPRESSION: 1. Enteric feeding tube removed. Otherwise, stable lines and tubes. 2. Sequelae of COVID-19 with mildly improved ventilation in both lower lungs since 11/13/2020. Electronically Signed   By: HGenevie AnnM.D.   On: 11/25/2020 05:57    Labs: BMET Recent Labs  Lab 11/19/20 0627 11/20/20 0629 11/21/20 0217 11/22/20 0640 11/24/20 0906 11/25/20 0440  NA 144 131* 133* 135 137 135  K 4.4 4.0 4.4 3.4* 3.8 4.9  CL 107 96* 97* 98 99 98  CO2 _1 GLUCOSE 97 78 187* 114* 101* 133*  BUN 103* 46* 57* 61* 45* 24*  CREATININE 1.10* 1.19* 1.63* 1.59* 2.15* 1.86*  CALCIUM 10.0 9.8 10.1 10.5* 10.1 10.6*  PHOS 5.3*  --   --  7.0* 7.1*  --    CBC Recent Labs  Lab 11/19/20 1100 11/22/20 0640 11/24/20 0906 11/25/20 0440  WBC 10.4 9.2 12.5* 15.4*  NEUTROABS  --   --   --  10.0*  HGB 8.6* 9.5* 8.9* 10.0*  HCT 29.8* 31.6* 28.9* 33.4*  MCV 91.4 89.0 88.7 88.4  PLT 352 365 397 386  Medications:    . (feeding supplement) PROSource Plus  30 mL Oral TID WC  . apixaban  2.5 mg Oral BID  . chlorhexidine gluconate (MEDLINE KIT)  15 mL Mouth Rinse BID  . Chlorhexidine Gluconate Cloth  6 each Topical Daily  . cholecalciferol  2,000 Units Oral Daily  . clonazepam  0.25 mg Oral BID  . collagenase   Topical BID  . darbepoetin (ARANESP) injection - DIALYSIS  100 mcg Intravenous Q Tue-HD  . fentaNYL  1 patch Transdermal Q72H  . guaiFENesin  15 mL Oral Q12H  . insulin aspart  0-5 Units Subcutaneous QHS  . insulin aspart  0-9 Units Subcutaneous TID WC  . mouth rinse  15 mL Mouth Rinse QID   . metoprolol tartrate  50 mg Oral BID  . midodrine  40 mg Oral TID WC  . multivitamin  1 tablet Oral QHS  . nutrition supplement (JUVEN)  1 packet Oral BID BM  . pantoprazole sodium  40 mg Oral BID  . QUEtiapine  12.5 mg Oral QHS  . sodium chloride flush  10-40 mL Intracatheter Q12H      Justin Mend, MD 11/25/2020, 11:48 AM

## 2020-11-25 NOTE — Progress Notes (Addendum)
[facility] PROGRESS NOTE  Candace [name] DGL:[id number] DOB: January 24, 1951 DOA: 08/31/2020 PCP: Algis Greenhouse, MD       Status: Remains inpatient appropriate because:Ongoing diagnostic testing needed not appropriate for outpatient work up, Unsafe d/c plan, IV treatments appropriate due to intensity of illness or inability to take PO and Inpatient level of care appropriate due to severity of illness   Dispo: The patient is from: Home              Anticipated d/c is to: SNF recommended-family wishes to take home and states absolutely no institutional care such as SNF or LTAC              Anticipated d/c date is: > 3 days              Patient currently is not medically stable to d/c.  Barriers to discharge: Patient continues to have persistent tachycardia likely related to severe physical deconditioning as well as ongoing tachypnea likely related to persistent metabolic acidemia from kidney disease, continues to require intermittent hemodialysis and is not physically able to/pivot to transfer to wheelchair to proceed with outpatient hemodialysis.  Core track feeding tube clogged has been removed.  Family wishes to not resume artificial feedings at this juncture.  They feel patient's oversedation from medications previously used to treat delirium are contributing to her lack of intake of food and lack of alertness.  Once these medications have been weaned and discontinued an appropriate washout time has been achieved we will revisit possibility of PEG tube if patient still too weak to eat  Considerations for home discharge: 1. Patient will need to continue wound VAC and current wound VAC is apparently not available in the home setting 2. It is likely she will need a specialty bed.  We are currently transitioning back to the [place] rotational bed which will allow for better in bed mobility including the ability to "stand" with PT in the bed without actually getting patient out of bed 3. Likely would  need to be decannulated to minimize other care issues after discharge 4. Would need to be able to either mobilize for outpatient hemodialysis will be a candidate for home dialysis 5. Would not have aggressive PT OT that she can achieve during hospitalization 6. Patient will have family members that have been trained somewhat in the care of patients managing her after discharge but this would be 24/7 requirement and this could be an overwhelming issue once patient is actually home  Code Status: Full Family Communication: 1/27 daughter [name] at bedside DVT prophylaxis: Eliquis Vaccination status: Has not been vaccinated but did have severe COVID infection initially diagnosed on 08/23/2020.  Patient will be eligible for COVID-vaccine after 11/23/2020   Foley catheter: No, purewick female urinary collection device   HPI: 70 year old female patient with prior history of diabetes, stage III chronic kidney disease, atrial fibrillation, hypertension, dyslipidemia and colon cancer that is post colectomy and colostomy.  Initially diagnosed with COVID on 08/23/20 she was admitted to the hospital due to dehydration, acute kidney injury and hypoxemic respiratory failure.  She was discharged home on 2 to 3 L of oxygen after being treated with IV steroids and remdesivir.  During that time although she had elevated inflammatory markers she had no embolic or thrombotic disease.  She had been on prophylactic dose heparin during the hospitalization and was placed on Eliquis for 2 weeks for after discharge  Patient returned to the ER on 08/31/2020 for complaints of not  feeling well.  She was found to be in atrial fibrillation with RVR, hemorrhagic shock and work-up revealed a perforated duodenal ulcer.  She underwent exploratory laparotomy on 11/3.  She has had an extensive ICU stay with significant events as below.   11/2 Admitted with rapid Afib 11/3 OR with findings of perforated duodenal ulcer 11/10  progressive hemorrhagic shock, intubated, transfused, pressors, proned; started on CRRT in PM 11/16 Extubated. Re-intubated overnight due to respiratory distress and hypoxia with decreased mentation 11/18 Bronch, cultures sent 11/19 Hgb down getting blood 11/24 Spiked fever, resume empirical antimicrobial therapy 11/26 Hemorrhagic shock, hgb 5.6, increased pressors,  11/30 Per palliative "[name] understanding that patient is unfortunately very fragiledespite ongoing intensive medical treatment and full mechanical support. [name] that the familywantsto continue with all current interventions despite potential outcomes". 12/08 CRRT discontinued due to clotting 12/09 Family requested transfer to tertiary care [facility] denied transfer  12/10 CRRT restarted. Episodes of tachycardia, tachypnea that seem to improve with pain management 12/11 Back in shock. Pressor requirements up. CXR worse. ABX resumed 12/12 Still requiring inc pressors. Had hypoglycemic event. Basal insulin dosing adjusted 12/13 Pressor requirements better. Now hyperglycemic. Re-adjusted Glycemic control  12/19 Afebrile . Remains on dilaudid and heparin gtt, dilaudid gtt increased overnight for concern of pain / ongoing tachycardia, no other events . NE and precedex off 12/17. Ongoing CRRT- even UF, dosing lokelmia this morning 12/20 On CRRT. Renal plans for HD cath and moving to HD. Getting wound care 12/24 - renal stopping CRRT today and plans HD 10/24/20 . 40% fio2 on vent via Trach. TAchypenic and tachycardic. Afebrile but wbc up to 27.6K. On TF. On heparin gtt 12/25 - Back on CRRT. On vent via trach at 40% fio2, On scheduled dilaudid as add on to oxy. Per family request 12/24 - they felt scheduled oxy was not adequate and patient was showing signs of opioid withdrawal.  Patient also had worsening SIRS/sepsis syndrome. Had fever, rising wbc, worsening RR 40-60 and HR 140s sinus->started On abx yesterday.  Fever some better today. WBC plateau at 28,.5K. On new levophed ->signifanct improvement in HR 77 and RR t0 20. On heparin gtt. On precedex gtt. On levophed gtt 76mg wthi midodrine. On TF 12/30 Remains on CRRT with intermittent pressor requirements. Ethics consult placed 12/29. Ethics rec time trial of CRRT 12/31 failed SBT with RR 40s. Several conversations between care tam and family, who are upset RE plan of care 1/1 back on pressors  1/4: On pressors, keeping even on CVVHD, HGB drop to 6.9, transfused 1 unit. Improving mental status 1/10 remains on low dose levophed 231m 1/11 off levo, attempting HD with UF for first time. Now tolerating intermittent HD. 1/19 patient transferred to progressive bed with tracheostomy on 35% FiO2.  has NG tube feeding.  No PEG tube.  Received dialysis on 1/18. 1/24 significant improvement and persistent tachycardia after introduction of twice daily beta-blocker.  Heart rates have improved from the 130s to the 98-104 range 1/24 core track clogged and therefore has been removed by nutrition team.  Calorie count in progress and we are weaning any sedating medications which could be contributing to patient's inability to eat. 1/27 continue w/ cuff #6 trach   Subjective: Patient awake with flat affect although will smile at times.  Daughter at bedside and again discussed limitations to discharge at this time primarily related to ongoing dialysis requirement, poor oral intake, need for specialty bed and need for trach.  Family still insistent upon  bringing patient home they will care for her.  Daughter open to have multiple family members who will be participating in patient's care come to the bedside to learn her current needs and assist with care.  Charge nurse for 2 W. as well as patient's current nurse updated on this new order and are in agreement with this plan.  Daughter also states she would wish to speak directly to the nephrology team regarding possibility of  home dialysis.  She reports that she was the one that had been approached by a medical provider/? member of the nephrology team about home dialysis.    Objective: Vitals:   11/25/20 0406 11/25/20 0734  BP:  130/83  Pulse: (!) 125 (!) 119  Resp: 16 20  Temp:  98.8 F (37.1 C)  SpO2:      Intake/Output Summary (Last 24 hours) at 11/25/2020 0748 Last data filed at 11/24/2020 1737 Gross per 24 hour  Intake 36 ml  Output 1662 ml  Net -1626 ml   Filed Weights   11/16/20 1500 11/16/20 1802 11/16/20 2120  Weight: 86 kg 85 kg 92.6 kg    Exam: Constitutional: Alert, no acute distress, calm Respiratory:  Trach collar in place, FiO2 21%.  No significant secretions from trach at this time.  Increased work of breathing.  #6 cuffed trach Cardiovascular: Continues to have resting tachycardia, maintaining sinus rhythm, extremities warm to touch with adequate capillary refill, normal heart sounds S1 and S2 Abdomen:Core track tube clogged and has been removed.  On D2 diet with nectar thick liquids. LBM 1/26-calorie count in progress Skin: no rashes, lesions, ulcers. No induration.  Patient does have massive decubitus ulcer-wound VAC now in place Neurologic: CN 2-12 grossly intact. Sensation intact, DTR normal. Strength 1/5 x all 4 extremities.  Patient unable to lift legs off bed or bend knees at all.  She is barely able to lift her arms off the bed but was unable to touch her nose with either hand. Psychiatric: Normal judgment and insight. Alert and oriented x 3. Normal mood.    Assessment/Plan: Acute problems: PAF maintaining sinus rhythm w/persistent tachycardia -Currently maintaining sinus rhythm with tachycardic rates.  Ventricular response have been in the 90 to low 100 ranges and had increased up into the 120s during dialysis. -Continue metoprolol as BP tolerates-significant tachycardia improved with rates in the high 90s to low 100s now -Cardiology has seen in consultation -Given  persistent tachycardia will repeat echocardiogram to rule out tachycardia induced cardiomyopathy.  Last echo in November 2021 with hyperdynamic EF 70-75% with mild LVH and mild RV systolic dysfunction; repeat echocardiogram this admission with an EF 50 to 55% with mild diastolic dysfunction parameters and normal RV systolic function -Continue Eliquis-benefits coordinator has determined that Eliquis is covered at [facility] in [place] with a co-pay of $47  Acute respiratory failure secondary to COVID-pneumonia/tracheostomy -Patient stable on low-flow oxygen FiO2 21% -PCCM/trach team following -Repeat respiratory culture on 1/24 with abundant staph with sensitivities pending -Chest x-ray on 1/27 only demonstrates expected sequela of Covid pneumonia with improved ventilation in both lower lungs since 1/15.  This does not appear to be consistent with acute infection although her white count has increased to 15,400- on 1/26 she did have a T-max of 100.1 F.  Defer to PCCM as to whether appropriate to initiate antibiotic -Continues to have excessive secretions so we will continue aggressive suctioning and chest PT-Vibra vest -Continue PMV training noting patient has passed a modified barium swallow evaluation and  diet has been advanced  Acute kidney injury secondary to COVID-related sepsis with shock superimposed on stage III chronic kidney disease -Continue intermittent hemodialysis as directed by the renal team -Avoid nephrotoxic medications -Continue Aranesp on dialysis days  Dysphagia/moderate to severe protein calorie malnutrition Nutrition Status: Nutrition Problem: Increased nutrient needs Etiology: wound healing Signs/Symptoms: estimated needs Interventions: Refer to RD note for recommendations  Estimated body mass index is 31.04 kg/m as calculated from the following:   Height as of this encounter: 5' 8"  (1.727 m).   Weight as of this encounter: 92.6 kg.  -Begin calorie count-upon review of  documentation of intake patient is only eating about 10% of meals-core track became clogged and has been removed by nutrition team. She has now been switched to D2 nectar thick diet. -Also tapering and discontinuing any offending medications that may be contributing to poor appetite or alertness -Nutrition team had a long discussion with patient's daughter at bedside today.  Daughter amenable at this time to have core track replaced but for utilization for meds and nocturnal feedings only.  It is hopeful that this strategy will help improve patient's oral intake while maintaining adequate caloric intake -We will also contact surgery to determine if patient is a candidate for gastrostomy tube placement.  Uncertain with her surgical history if she can undergo placement in IR versus would need open placement in the operating room. -1/27 will initiate Megace for appetite stimulant.  This medication can precipitate thrombotic events but patient is fully anticoagulated with Eliquis.  Acute hemorrhagic shock secondary to perforated duodenal ulcer/anemia of critical illness -Stable postoperatively -Has tolerated tube feedings and oral diet without any evidence of postoperative obstructive issues which can be common after duodenal oversewing with subsequent scar tissue formation -Hemoglobin stable today 8.6 since admission  Diabetes mellitus 2 -CBGs down in the low 90s now that off tube feedings therefore will discontinue  low-dose Lantus and meal coverage but will continue with sliding scale insulin -CBGs well controlled Lantus insulin and meal coverage discontinued -HgbA1c 08/24/2020 and was 7.0 -Prior to admission patient was on metformin XR  Profound physical deconditioning/acute encephalopathy secondary to prolonged hospital stay/orthostasis -SNF recommended but family wants to take patient home -At this juncture patient unable to sit up in bed independently much less stand pivot and rotate to get  into wheelchair and would be significant fall risk and care risk at home -In addition patient would need to be able to get in wheelchair to transport to dialysis if dialysis were to be continued in the outpatient setting -Evaluating for possible LTAC placement-family refuses this as well as SNF rehab due to pt prior request to NEVER be placed in any type of facility -Patient oriented to name and place at this juncture.  Seroquel dc'd and Klonopin is being tapered-utilizing for issues related to acute ICU delirium -Continue high-dose midodrine 3 times daily -Persistent tachycardia secondary to severe deconditioning has improved with the introduction of beta-blockers -1/27 begin nurse driven PROM every 4 hour -FAMILY THAT WILL PROVIDE CARE FOR PT WILL NEED TO ASSIST WITH CARE OF THIS PT AT [place]  Acute encephalopathy with ICU delirium -Delirium has resolved -Seroquel taper initiated this week.  Have decreased to 25 mg HS the next 3 nights then will discontinue -Continue Klonopin 0.25 mg BID for 3 more days and discontinue -Duragesic taper dose decreased as above  Stage IV sacral decubitus -Not present on admission -Wound care nurse following and utilizing Santyl and hydrotherapy  and now has wound VAC in place -Surgical team consulted.  Current recommendation is against pursuing general anesthesia to undergo extensive surgical procedure-hopefully can be reevaluated in the future if wound does not heal adequately -[place] rotational bed  -Continue Duragesic patch for pain but will decrease dosage to 25 mcg every 72 hours since this medication has a long half-life and is excreted in the urine therefore can accumulate in between dialysis sessions.  Continue low-dose oxycodone for breakthrough pain -  11/24/2020 after [facility]      11/22/2020  Incision (Closed) 09/01/20 Abdomen (Active)  Date First Assessed/Time First Assessed: 09/01/20 1737   Location: Abdomen    Assessments 09/01/2020   6:25 PM 11/21/2020  7:47 PM  Dressing Type Gauze (Comment) --  Site / Wound Assessment Dressing in place / Unable to assess --  Drainage Amount None --  Treatment -- Other (Comment)     No Linked orders to display     Pressure Injury 09/17/20 Ear Left;Anterior;Posterior Stage 2 -  Partial thickness loss of dermis presenting as a shallow open injury with a red, pink wound bed without slough. (Active)  Date First Assessed/Time First Assessed: 09/17/20 0800   Location: Ear  Location Orientation: Left;Anterior;Posterior  Staging: Stage 2 -  Partial thickness loss of dermis presenting as a shallow open injury with a red, pink wound bed without slough. ...    Assessments 09/17/2020  8:00 AM 11/21/2020  7:47 PM  Dressing Type Foam - Lift dressing to assess site every shift None  Dressing Clean;Dry;Intact --  Dressing Change Frequency Every 3 days --  Site / Wound Assessment Dry;Pink --  Peri-wound Assessment Intact --  Wound Length (cm) 2 cm --  Wound Width (cm) 1 cm --  Wound Depth (cm) 0.25 cm --  Wound Surface Area (cm^2) 2 cm^2 --  Wound Volume (cm^3) 0.5 cm^3 --  Margins Unattached edges (unapproximated) --  Drainage Amount Scant --  Drainage Description Serosanguineous --  Treatment Cleansed --     No Linked orders to display     Pressure Injury 09/25/20 Sacrum Bilateral;Medial Deep Tissue Pressure Injury - Purple or maroon localized area of discolored intact skin or blood-filled blister due to damage of underlying soft tissue from pressure and/or shear. Purple, non-blanchable, b (Active)  Date First Assessed/Time First Assessed: 09/25/20 2000   Location: Sacrum  Location Orientation: Bilateral;Medial  Staging: Deep Tissue Pressure Injury - Purple or maroon localized area of discolored intact skin or blood-filled blister due to damage o...    Assessments 09/25/2020  5:00 PM 11/24/2020  3:31 PM  Dressing Type Foam - Lift dressing to assess site every shift Other (Comment)  Dressing  Changed;Clean;Dry;Intact Other (Comment)  Dressing Change Frequency -- Every other day  State of Healing -- Early/partial granulation  Site / Wound Assessment -- Yellow;Red  % Wound base Red or Granulating -- 10%  % Wound base Yellow/Fibrinous Exudate -- 80%  % Wound base Black/Eschar -- 10%  % Wound base Other/Granulation Tissue (Comment) -- 0%  Peri-wound Assessment -- Intact  Margins -- Unattached edges (unapproximated)  Drainage Amount -- Minimal  Drainage Description -- Serous  Treatment -- Hydrotherapy (Pulse lavage);Debridement (Selective);Negative pressure wound therapy     No Linked orders to display     Negative Pressure Wound Therapy Sacrum (Active)  Placement Date/Time: 11/22/20 1500   Wound Type: (c) Other (Comment)  Location: Sacrum    Assessments 11/22/2020  4:59 PM 11/24/2020  3:31 PM  Last dressing change 11/22/20 11/24/20  Site / Wound Assessment Granulation tissue;Pale;Yellow Yellow;Red  Peri-wound Assessment Intact Intact  Size see above see above  Wound filler - Black foam 2 2  Wound filler - White foam 0 0  Wound filler - Nonadherent 0 --  Wound filler - Gauze 0 --  Cycle Continuous Continuous  Target Pressure (mmHg) 125 125  Instillation Volume 26 mL 26 mL  Instillation Solution Normal Saline Normal Saline  Instillation Soak Time 10 minutes 10 minutes  Instillation Therapy Time 3.5 hours 3.5 hours  Canister Changed No No  Dressing Status Intact --  Drainage Amount None Minimal     No Linked orders to display    Hypomagnesemia -Continue oral magnesium -1/21 give 4 g magnesium IV x1 -Follow labs  Other problems: History of stage IV colon cancer -Old colostomy  DVT bilateral posterior tibial veins -Was not present during initial COVID admission -Continue Eliquis for now given severe debility and increased risk for developing recurrent DVT  Hypertension -Having issues with orthostasis requiring midodrine Prior to admission patient was on  Norvasc  Dyslipidemia -Prior to admission patient was on Crestor-consider resuming soon once patient can swallow pills whole  Abnormal TSH -Nov 2021 TSH was 0.015-TSH and free T4, T3 normal this admission  Data Reviewed: Basic Metabolic Panel: Recent Labs  Lab 11/19/20 0627 11/20/20 0629 11/21/20 0217 11/22/20 0640 11/24/20 0906 11/25/20 0440  NA 144 131* 133* 135 137 135  K 4.4 4.0 4.4 3.4* 3.8 4.9  CL 107 96* 97* 98 99 98  CO2 23 22 24 22 25 24   GLUCOSE 97 78 187* 114* 101* 133*  BUN 103* 46* 57* 61* 45* 24*  CREATININE 1.10* 1.19* 1.63* 1.59* 2.15* 1.86*  CALCIUM 10.0 9.8 10.1 10.5* 10.1 10.6*  MG 1.5* 2.9*  --   --   --   --   PHOS 5.3*  --   --  7.0* 7.1*  --    Liver Function Tests: Recent Labs  Lab 11/22/20 0640 11/24/20 0906  ALBUMIN 2.5* 2.5*   No results for input(s): LIPASE, AMYLASE in the last 168 hours. No results for input(s): AMMONIA in the last 168 hours. CBC: Recent Labs  Lab 11/19/20 1100 11/22/20 0640 11/24/20 0906 11/25/20 0440  WBC 10.4 9.2 12.5* 15.4*  NEUTROABS  --   --   --  10.0*  HGB 8.6* 9.5* 8.9* 10.0*  HCT 29.8* 31.6* 28.9* 33.4*  MCV 91.4 89.0 88.7 88.4  PLT 352 365 397 386   Cardiac Enzymes: No results for input(s): CKTOTAL, CKMB, CKMBINDEX, TROPONINI in the last 168 hours. BNP (last 3 results) Recent Labs    09/07/20 0118 09/08/20 0446 09/09/20 0428  BNP 216.8* 432.5* 609.7*    ProBNP (last 3 results) No results for input(s): PROBNP in the last 8760 hours.  CBG: Recent Labs  Lab 11/23/20 2010 11/24/20 1156 11/24/20 1633 11/24/20 1935 11/25/20 0734  GLUCAP 109* 92 102* 98 119*    Recent Results (from the past 240 hour(s))  Culture, respiratory (non-expectorated)     Status: None (Preliminary result)   Collection Time: 11/22/20  4:45 PM   Specimen: Tracheal Aspirate; Respiratory  Result Value Ref Range Status   Specimen Description TRACHEAL ASPIRATE  Final   Special Requests NONE  Final   Gram Stain    Final    ABUNDANT WBC PRESENT,BOTH PMN AND MONONUCLEAR ABUNDANT GRAM POSITIVE COCCI IN PAIRS    Culture   Final    MODERATE STAPHYLOCOCCUS AUREUS SUSCEPTIBILITIES TO FOLLOW Performed at  [facility], [place] [address], [place], [place] [address]    Report Status PENDING  Incomplete     Studies: DG CHEST PORT 1 VIEW  Result Date: 11/25/2020 CLINICAL DATA:  70 year old female with respiratory failure. COVID-19. EXAM: PORTABLE CHEST 1 VIEW COMPARISON:  Portable chest 11/13/2020 and earlier. FINDINGS: Portable AP semi upright view at 0345 hours. Satisfactory tracheostomy tube. Stable dual lumen right chest catheter. Superimposed right PICC line is stable. Enteric feeding tube has been removed. Stable low lung volumes with coarse and patchy lower lung opacity. Stable cardiac size and mediastinal contours. No pneumothorax, pulmonary edema or pleural effusion. Ventilation has mildly improved at both lung bases. Paucity of bowel gas in the upper abdomen. Stable visualized osseous structures. IMPRESSION: 1. Enteric feeding tube removed. Otherwise, stable lines and tubes. 2. Sequelae of COVID-19 with mildly improved ventilation in both lower lungs since 11/13/2020. Electronically Signed   By: Genevie Ann M.D.   On: 11/25/2020 05:57    Scheduled Meds: . (feeding supplement) PROSource Plus  30 mL Oral TID WC  . apixaban  2.5 mg Oral BID  . chlorhexidine gluconate (MEDLINE KIT)  15 mL Mouth Rinse BID  . Chlorhexidine Gluconate Cloth  6 each Topical Daily  . cholecalciferol  2,000 Units Oral Daily  . clonazepam  0.25 mg Oral BID  . collagenase   Topical BID  . darbepoetin (ARANESP) injection - DIALYSIS  100 mcg Intravenous Q Tue-HD  . fentaNYL  1 patch Transdermal Q72H  . guaiFENesin  15 mL Oral Q12H  . insulin aspart  0-5 Units Subcutaneous QHS  . insulin aspart  0-9 Units Subcutaneous TID WC  . mouth rinse  15 mL Mouth Rinse QID  . metoprolol tartrate  50 mg Oral BID  . midodrine  40 mg Oral TID  WC  . multivitamin  1 tablet Oral QHS  . nutrition supplement (JUVEN)  1 packet Oral BID BM  . pantoprazole sodium  40 mg Oral BID  . QUEtiapine  25 mg Oral QHS  . sodium chloride flush  10-40 mL Intracatheter Q12H   Continuous Infusions: . sodium chloride 5 mL/hr at 11/16/20 0935  . sodium chloride    . albumin human 25 g (11/16/20 1516)    Principal Problem:   Atrial fibrillation with RVR ([place]) Active Problems:   Acute respiratory failure due to COVID-19 [facility])   New onset a-fib ([facility])   Leukocytosis   Atrial fibrillation with rapid ventricular response ([facility])   Hypoxia   Pressure injury of skin   Acute hypoxemic respiratory failure ([facility])   ARDS (adult respiratory distress syndrome) ([facility])   Perforated duodenal ulcer ([place])   On mechanically assisted ventilation ([place])   Palliative care by specialist   Goals of care, counseling/discussion   Shock ([place])   Acute and chronic respiratory failure (acute-on-chronic) ([place])   Status post tracheostomy ([place])   ESRD (end stage renal disease) [facility])   Consultants:  Cardiology  Surgery  Nephrology  Ethics  PCCM   Procedures: R PICC 11/5 >> A line 11/9 >> out ETT 11/9 > 11/16, 11/16 >> 09/21/2020, 09/21/2020 tracheostomy>> Lt [place] CVL 11/9 >> R IJ trialysis >> out HD catheter 12/1 >>12/20 12/21 - 14.5 Fr, 23 cm right IJ tunneled hemodialysis catheter placement. Removal of indwelling subclavian catheter.   Antibiotics: Anti-infectives (From admission, onward)   Start     Dose/Rate Route Frequency Ordered Stop   11/17/20 1415  fluconazole (DIFLUCAN) 40 MG/ML suspension 200 mg  200 mg Oral  Once 11/17/20 1315 11/17/20 1357   11/03/20 2200  meropenem (MERREM) 500 mg in sodium chloride 0.9 % 100 mL IVPB        500 mg 200 mL/hr over 30 Minutes Intravenous Every 24 hours 11/03/20 1500 11/06/20 2217   11/01/20 1000  anidulafungin (ERAXIS) 100 mg in sodium chloride 0.9 % 100 mL IVPB  Status:  Discontinued       "Followed by"  Linked Group Details   100 mg 78 mL/hr over 100 Minutes Intravenous Every 24 hours 10/31/20 0916 11/01/20 1404   10/31/20 1015  meropenem (MERREM) 1 g in sodium chloride 0.9 % 100 mL IVPB  Status:  Discontinued        1 g 200 mL/hr over 30 Minutes Intravenous Every 8 hours 10/31/20 0916 11/03/20 1500   10/31/20 1015  linezolid (ZYVOX) IVPB 600 mg  Status:  Discontinued        600 mg 300 mL/hr over 60 Minutes Intravenous Every 12 hours 10/31/20 0916 11/02/20 0906   10/31/20 1015  anidulafungin (ERAXIS) 200 mg in sodium chloride 0.9 % 200 mL IVPB       "Followed by" Linked Group Details   200 mg 78 mL/hr over 200 Minutes Intravenous  Once 10/31/20 0916 10/31/20 1630   10/25/20 1800  ceFAZolin (ANCEF) IVPB 2g/100 mL premix  Status:  Discontinued        2 g 200 mL/hr over 30 Minutes Intravenous Every 12 hours 10/25/20 1022 10/30/20 1103   10/23/20 2000  vancomycin (VANCOREADY) IVPB 750 mg/150 mL  Status:  Discontinued        750 mg 150 mL/hr over 60 Minutes Intravenous Every 24 hours 10/22/20 2036 10/25/20 1022   10/23/20 1800  piperacillin-tazobactam (ZOSYN) IVPB 3.375 g  Status:  Discontinued        3.375 g 100 mL/hr over 30 Minutes Intravenous Every 6 hours 10/23/20 1155 10/24/20 1422   10/23/20 0200  piperacillin-tazobactam (ZOSYN) IVPB 3.375 g  Status:  Discontinued        3.375 g 100 mL/hr over 30 Minutes Intravenous Every 8 hours 10/22/20 2036 10/23/20 1155   10/22/20 1630  vancomycin (VANCOREADY) IVPB 1500 mg/300 mL        1,500 mg 150 mL/hr over 120 Minutes Intravenous  Once 10/22/20 1537 10/22/20 2229   10/22/20 1630  piperacillin-tazobactam (ZOSYN) IVPB 2.25 g  Status:  Discontinued        2.25 g 100 mL/hr over 30 Minutes Intravenous Every 8 hours 10/22/20 1537 10/22/20 2036   10/22/20 1537  vancomycin variable dose per unstable renal function (pharmacist dosing)  Status:  Discontinued         Does not apply See admin instructions 10/22/20 1537 10/22/20 2036   10/19/20 0600   ceFAZolin (ANCEF) IVPB 2g/100 mL premix        2 g 200 mL/hr over 30 Minutes Intravenous To Radiology 10/18/20 1457 10/19/20 0938   10/10/20 0830  vancomycin (VANCOCIN) IVPB 1000 mg/200 mL premix       "Followed by" Linked Group Details   1,000 mg 200 mL/hr over 60 Minutes Intravenous Every 24 hours 10/09/20 0744 10/15/20 0902   10/09/20 0830  piperacillin-tazobactam (ZOSYN) IVPB 3.375 g        3.375 g 100 mL/hr over 30 Minutes Intravenous Every 6 hours 10/09/20 0744 10/16/20 0038   10/09/20 0830  vancomycin (VANCOREADY) IVPB 2000 mg/400 mL       "Followed by" Linked Group Details  2,000 mg 200 mL/hr over 120 Minutes Intravenous  Once 10/09/20 0744 10/09/20 1022   09/24/20 1000  ceFAZolin (ANCEF) IVPB 2g/100 mL premix  Status:  Discontinued        2 g 200 mL/hr over 30 Minutes Intravenous Every 12 hours 09/24/20 0801 09/24/20 1046   09/23/20 1400  vancomycin (VANCOCIN) IVPB 1000 mg/200 mL premix        1,000 mg 200 mL/hr over 60 Minutes Intravenous Every 24 hours 09/22/20 1436 09/28/20 1718   09/22/20 2200  ceFEPIme (MAXIPIME) 2 g in sodium chloride 0.9 % 100 mL IVPB  Status:  Discontinued        2 g 200 mL/hr over 30 Minutes Intravenous Every 12 hours 09/22/20 1436 09/24/20 0801   09/22/20 1030  ceFEPIme (MAXIPIME) 1 g in sodium chloride 0.9 % 100 mL IVPB        1 g 200 mL/hr over 30 Minutes Intravenous  Once 09/22/20 0934 09/22/20 1145   09/22/20 1015  vancomycin (VANCOCIN) IVPB 1000 mg/200 mL premix        1,000 mg 200 mL/hr over 60 Minutes Intravenous  Once 09/22/20 0934 09/22/20 1446   09/12/20 2200  ceFEPIme (MAXIPIME) 2 g in sodium chloride 0.9 % 100 mL IVPB        2 g 200 mL/hr over 30 Minutes Intravenous Every 12 hours 09/12/20 0732 09/14/20 2134   09/11/20 1400  ceFEPIme (MAXIPIME) 2 g in sodium chloride 0.9 % 100 mL IVPB  Status:  Discontinued        2 g 200 mL/hr over 30 Minutes Intravenous Every 8 hours 09/11/20 1126 09/12/20 0732   09/08/20 1000  vancomycin  (VANCOREADY) IVPB 2000 mg/400 mL        2,000 mg 200 mL/hr over 120 Minutes Intravenous  Once 09/08/20 0857 09/08/20 1224   09/08/20 1000  ceFEPIme (MAXIPIME) 2 g in sodium chloride 0.9 % 100 mL IVPB  Status:  Discontinued        2 g 200 mL/hr over 30 Minutes Intravenous Every 12 hours 09/08/20 0857 09/11/20 1126   09/08/20 0856  vancomycin variable dose per unstable renal function (pharmacist dosing)  Status:  Discontinued         Does not apply See admin instructions 09/08/20 0857 09/09/20 0935   09/02/20 1600  cefTRIAXone (ROCEPHIN) 1 g in sodium chloride 0.9 % 100 mL IVPB  Status:  Discontinued        1 g 200 mL/hr over 30 Minutes Intravenous Every 24 hours 09/01/20 1811 09/02/20 0838   09/01/20 1800  fluconazole (DIFLUCAN) IVPB 400 mg        400 mg 50 mL/hr over 240 Minutes Intravenous  Once 09/01/20 1749 09/02/20 0603   09/01/20 1530  piperacillin-tazobactam (ZOSYN) IVPB 3.375 g        3.375 g 12.5 mL/hr over 240 Minutes Intravenous Every 8 hours 09/01/20 1514 09/05/20 2111   09/01/20 1000  levofloxacin (LEVAQUIN) tablet 250 mg  Status:  Discontinued        250 mg Oral Daily 08/31/20 1508 08/31/20 1735   08/31/20 1730  cefTRIAXone (ROCEPHIN) 1 g in sodium chloride 0.9 % 100 mL IVPB  Status:  Discontinued        1 g 200 mL/hr over 30 Minutes Intravenous Every 24 hours 08/31/20 1726 09/01/20 1513   08/31/20 1730  azithromycin (ZITHROMAX) 500 mg in sodium chloride 0.9 % 250 mL IVPB  Status:  Discontinued        500  mg 250 mL/hr over 60 Minutes Intravenous Every 24 hours 08/31/20 1726 09/02/20 0838       Time spent: 30 minutes    [name] ANP  [facility] 7 am - 330 pm/M-F for direct patient care and secure chat Please refer to Amion for contact info 85  days

## 2020-11-25 NOTE — Evaluation (Signed)
Physical Therapy Evaluation Patient Details Name: Candace [name] MRN: [id number] DOB: 07-03-1951 Today's Date: 11/25/2020   History of Present Illness  70 y.o. female with medical history significant for recent covid pna, obesity, colon cancer s/p colon resection with colostomy bag, HLD, NIDDM2, CKD3 presented to ED after her follow-up nurse advised her to present to ED for elevated HR. +afib, elevated troponins with demand ischemia,  CT scan is positive for bowel perforation with pneumomediastinum 11/03 exp lap with repair of perforated ulcer. 11/10 t/f to ICU- intubated/sedated/proned. Trach placed 11/24. CRRT off 12/8, restarted 12/10-1/5.  Clinical Impression  Pt tolerated ROM to 4 extremities well, demo to daughter who reports she has been educated and is comfortable preforming on her own. I engaged daughter in assisting me in repositioning pt in high chair mode in the bed, re-applied PREVALON boots and daughter did the other side, returning demonstration.  Pt's BP increased with body positioned in high chair mode, which is good as I was anticipating the opposite.  She is likely ready for chair trails, but this will need to be coordinated earlier in the day with PT and RN staff.  We will need a geo mat cushion in the chair.     Follow Up Recommendations SNF;LTACH    Equipment Recommendations  Wheelchair (measurements PT);Wheelchair cushion (measurements PT);Hospital bed;Other (comment) (air mattress, hoyer lift, ideally a custom WC 18x18 tilt in space with roho cushion.)    Recommendations for Other Services OT consult     Precautions / Restrictions Precautions Precautions: Fall Precaution Comments: baseline colostomy; sacral wound with wound vac      Mobility  Bed Mobility Overal bed mobility: Needs Assistance   Rolling: Total assist;+2 for physical assistance         General bed mobility comments: Total assist +2 to scoot to [facility] to position pt in high chair mode.  total assist  to roll and second person to place pillow for pressure relief on bottom (due for rotation).  Daughter assisting with both.    Transfers                 General transfer comment: NT, plan for OOB to recliner chair to test sitting tol for OP HD.  Pt will need geo mat cushion and likely to be seen earlier in the day to facilitate this move.  Ambulation/Gait                Stairs            Wheelchair Mobility    Modified Rankin (Stroke Patients Only)       Balance                                             Pertinent Vitals/Pain Pain Assessment: Faces Faces Pain Scale: Hurts even more Pain Location: with ROM end range Pain Descriptors / Indicators: Grimacing Pain Intervention(s): Limited activity within patient's tolerance;Monitored during session;Repositioned    Home Living                        Prior Function                 Hand Dominance        Extremity/Trunk Assessment                Communication  Cognition Arousal/Alertness: Awake/alert Behavior During Therapy: WFL for tasks assessed/performed Overall Cognitive Status: Impaired/Different from baseline Area of Impairment: Attention;Memory;Following commands;Safety/judgement;Awareness;Problem solving                 Orientation Level:  (pt able to state her daughter was there, but not her name) Current Attention Level: Sustained Memory: Decreased short-term memory (cannot recall she has 3 daughters when asked said 5 (she has 5 children total)) Following Commands: Follows one step commands with increased time Safety/Judgement: Decreased awareness of safety;Decreased awareness of deficits Awareness: Intellectual Problem Solving: Slow processing;Decreased initiation;Difficulty sequencing;Requires verbal cues;Requires tactile cues General Comments: Pt alert, mouthing words around trach in hushed tones.  Getting details asked of her mixed up a  bit, but participative in activity/ROM.      General Comments      Exercises General Exercises - Upper Extremity Shoulder Flexion: AAROM;Right;10 reps Shoulder ABduction: AAROM;Right;10 reps;Supine Elbow Flexion: AAROM;Right;10 reps Elbow Extension: AAROM;Right;10 reps General Exercises - Lower Extremity Ankle Circles/Pumps: AAROM;Both;10 reps Heel Slides: AAROM;Both;10 reps Hip ABduction/ADduction: AAROM;Both;10 reps Other Exercises Other Exercises: painful knee flexion, elbow flexion and shoulder flexion (with decreased scapular movement with elevation of arm. I got distracted and did not get to the L UE, daughter reports she has been doing ROM to all extremities and did not need additional instruction today.  I did show her how to correctly donn the prevalons for optimal heel relief and DF assist.   Assessment/Plan    PT Assessment    PT Problem List         PT Treatment Interventions      PT Goals (Current goals can be found in the Care Plan section)       Frequency Min 2X/week   Barriers to discharge        Co-evaluation               AM-PAC PT "6 Clicks" Mobility  Outcome Measure Help needed turning from your back to your side while in a flat bed without using bedrails?: Total Help needed moving from lying on your back to sitting on the side of a flat bed without using bedrails?: Total Help needed moving to and from a bed to a chair (including a wheelchair)?: Total Help needed standing up from a chair using your arms (e.g., wheelchair or bedside chair)?: Total Help needed to walk in hospital room?: Total Help needed climbing 3-5 steps with a railing? : Total 6 Click Score: 6    End of Session Equipment Utilized During Treatment: Oxygen (28% TC, 5L O2) Activity Tolerance: Patient limited by pain Patient left: in bed;Other (comment);with family/visitor present (in high chair mode) Nurse Communication: Mobility status;Other (comment) (family education and  involvement in session.) PT Visit Diagnosis: Muscle weakness (generalized) (M62.81);Difficulty in walking, not elsewhere classified (R26.2);Pain;Adult, failure to thrive (R62.7);Unsteadiness on feet (R26.81) Pain - Right/Left: Right Pain - part of body: Knee    Time: 9735-3299 PT Time Calculation (min) (ACUTE ONLY): 52 min   Charges:     PT Treatments $Therapeutic Exercise: 23-37 mins $Therapeutic Activity: 8-22 mins        Verdene Lennert, PT, DPT  Acute Rehabilitation [phone] pager [phone]) [phone] office

## 2020-11-26 DIAGNOSIS — Y95 Nosocomial condition: Secondary | ICD-10-CM | POA: Diagnosis not present

## 2020-11-26 DIAGNOSIS — J9621 Acute and chronic respiratory failure with hypoxia: Secondary | ICD-10-CM | POA: Diagnosis not present

## 2020-11-26 DIAGNOSIS — J189 Pneumonia, unspecified organism: Secondary | ICD-10-CM | POA: Diagnosis not present

## 2020-11-26 DIAGNOSIS — I4891 Unspecified atrial fibrillation: Secondary | ICD-10-CM | POA: Diagnosis not present

## 2020-11-26 LAB — CBC
HCT: 29.5 % — ABNORMAL LOW (ref 36.0–46.0)
Hemoglobin: 8.9 g/dL — ABNORMAL LOW (ref 12.0–15.0)
MCH: 26.2 pg (ref 26.0–34.0)
MCHC: 30.2 g/dL (ref 30.0–36.0)
MCV: 86.8 fL (ref 80.0–100.0)
Platelets: 410 10*3/uL — ABNORMAL HIGH (ref 150–400)
RBC: 3.4 MIL/uL — ABNORMAL LOW (ref 3.87–5.11)
RDW: 16.1 % — ABNORMAL HIGH (ref 11.5–15.5)
WBC: 15.2 10*3/uL — ABNORMAL HIGH (ref 4.0–10.5)
nRBC: 0 % (ref 0.0–0.2)

## 2020-11-26 LAB — RENAL FUNCTION PANEL
Albumin: 2.6 g/dL — ABNORMAL LOW (ref 3.5–5.0)
Anion gap: 14 (ref 5–15)
BUN: 61 mg/dL — ABNORMAL HIGH (ref 8–23)
CO2: 25 mmol/L (ref 22–32)
Calcium: 10.4 mg/dL — ABNORMAL HIGH (ref 8.9–10.3)
Chloride: 95 mmol/L — ABNORMAL LOW (ref 98–111)
Creatinine, Ser: 2.59 mg/dL — ABNORMAL HIGH (ref 0.44–1.00)
GFR, Estimated: 19 mL/min — ABNORMAL LOW (ref >60)
Glucose, Bld: 105 mg/dL — ABNORMAL HIGH (ref 70–99)
Phosphorus: 6.6 mg/dL — ABNORMAL HIGH (ref 2.5–4.6)
Potassium: 4.3 mmol/L (ref 3.5–5.1)
Sodium: 134 mmol/L — ABNORMAL LOW (ref 135–145)

## 2020-11-26 LAB — GLUCOSE, CAPILLARY
Glucose-Capillary: 103 mg/dL — ABNORMAL HIGH (ref 70–99)
Glucose-Capillary: 134 mg/dL — ABNORMAL HIGH (ref 70–99)
Glucose-Capillary: 142 mg/dL — ABNORMAL HIGH (ref 70–99)
Glucose-Capillary: 276 mg/dL — ABNORMAL HIGH (ref 70–99)

## 2020-11-26 MED ORDER — CEFAZOLIN SODIUM-DEXTROSE 1-4 GM/50ML-% IV SOLN
1.0000 g | Freq: Once | INTRAVENOUS | Status: AC
  Administered 2020-11-26: 1 g via INTRAVENOUS
  Filled 2020-11-26: qty 50

## 2020-11-26 MED ORDER — NEPRO/CARBSTEADY PO LIQD: 1000.0000 mL | ORAL | Status: DC

## 2020-11-26 MED ORDER — CLONAZEPAM 0.25 MG PO TBDP
0.2500 mg | ORAL_TABLET | Freq: Every day | ORAL | Status: DC
  Administered 2020-11-27 – 2020-12-05 (×9): 0.25 mg via ORAL
  Filled 2020-11-26 (×9): qty 1

## 2020-11-26 MED ORDER — DARBEPOETIN ALFA 100 MCG/0.5ML IJ SOSY
100.0000 ug | PREFILLED_SYRINGE | INTRAMUSCULAR | Status: DC
  Administered 2020-12-04 – 2020-12-11 (×2): 100 ug via INTRAVENOUS
  Filled 2020-11-26 (×3): qty 0.5

## 2020-11-26 MED ORDER — NEPRO/CARBSTEADY PO LIQD
1000.0000 mL | ORAL | Status: DC
  Administered 2020-11-26 – 2020-12-01 (×6): 1000 mL
  Filled 2020-11-26 (×6): qty 1000

## 2020-11-26 MED ORDER — PROSOURCE TF PO LIQD
45.0000 mL | Freq: Three times a day (TID) | ORAL | Status: DC
  Administered 2020-11-26 – 2020-12-13 (×50): 45 mL
  Filled 2020-11-26 (×47): qty 45

## 2020-11-26 MED ORDER — CEFAZOLIN SODIUM-DEXTROSE 1-4 GM/50ML-% IV SOLN
1.0000 g | INTRAVENOUS | Status: AC
  Administered 2020-11-27 – 2020-12-03 (×7): 1 g via INTRAVENOUS
  Filled 2020-11-26 (×9): qty 50

## 2020-11-26 NOTE — Progress Notes (Signed)
Physical Therapy Wound Treatment Patient Details  Name: Candace [name] MRN: [id number] Date of Birth: [birth year]  Today's Date: 11/26/2020 Time: 1011-1100 Time Calculation (min): 49 min  Subjective  Subjective: Pt awake and agreeable. Patient and Family Stated Goals: heal wound Date of Onset:  (unknown) Prior Treatments: prior hydro and Dakins  Pain Score:  face  4/10  Asked RN for additional pain meds during procedure.   Wound Assessment  Pressure Injury 09/25/20 Sacrum Bilateral;Medial Deep Tissue Pressure Injury - Purple or maroon localized area of discolored intact skin or blood-filled blister due to damage of underlying soft tissue from pressure and/or shear. Purple, non-blanchable, b (Active)  Wound Image   11/18/20 1500  Dressing Type Other (Comment) 11/26/20 1223  Dressing Changed;Clean;Dry;Intact 11/26/20 1223  Dressing Change Frequency Monday, Wednesday, Friday 11/26/20 1223  State of Healing Eschar 11/26/20 1223  Site / Wound Assessment Yellow;Red 11/26/20 1223  % Wound base Red or Granulating 10% 11/26/20 1223  % Wound base Yellow/Fibrinous Exudate 80% 11/26/20 1223  % Wound base Black/Eschar 10% 11/26/20 1223  % Wound base Other/Granulation Tissue (Comment) 0% 11/26/20 1223  Peri-wound Assessment Intact 11/24/20 1531  Wound Length (cm) 14.2 cm 11/18/20 1500  Wound Width (cm) 12 cm 11/18/20 1500  Wound Depth (cm) 5.4 cm 11/18/20 1500  Wound Surface Area (cm^2) 170.4 cm^2 11/18/20 1500  Wound Volume (cm^3) 920.16 cm^3 11/18/20 1500  Tunneling (cm) 0 11/19/20 1623  Undermining (cm) 0 11/19/20 1623  Margins Unattached edges (unapproximated) 11/26/20 1223  Drainage Amount Moderate 11/26/20 1223  Drainage Description Serous;Serosanguineous 11/26/20 1223  Treatment Cleansed;Debridement (Selective);Negative pressure wound therapy 11/26/20 1223     Negative Pressure Wound Therapy Sacrum (Active)  Last dressing change 11/26/20 11/26/20 1223  Site / Wound Assessment  Yellow;Red 11/26/20 1223  Peri-wound Assessment Intact 11/26/20 1223  Size see above 11/26/20 1223  Wound filler - Black foam 2 11/26/20 1223  Wound filler - White foam 0 11/26/20 1223  Wound filler - Nonadherent 0 11/22/20 1659  Wound filler - Gauze 0 11/22/20 1659  Cycle Continuous 11/26/20 1223  Target Pressure (mmHg) 125 11/26/20 1223  Instillation Volume 26 mL 11/26/20 1223  Instillation Solution Normal Saline 11/26/20 1223  Instillation Soak Time 10 minutes 11/26/20 1223  Instillation Therapy Time 3.5 hours 11/26/20 1223  Canister Changed No 11/26/20 1223  Dressing Status Intact 11/22/20 1659  Drainage Amount Minimal 11/26/20 1223  Output (mL) 125 mL 11/25/20 2134      Hydrotherapy  Selective Debridement Selective Debridement - Location: sacrum Selective Debridement - Tools Used: Forceps;Scalpel Selective Debridement - Tissue Removed: necrotic adipose, eschar   Wound Assessment and Plan  Wound Therapy - Assess/Plan/Recommendations Wound Therapy - Clinical Statement: In general the wound is benefiting from the [facility] therapy, but pt still needs significant selective debridement between changes. Wound Therapy - Functional Problem List: Global weakness and immobility Factors Delaying/Impairing Wound Healing: Diabetes Mellitus;Immobility;Multiple medical problems;Other (comment) (poor nutrition) Hydrotherapy Plan: Debridement;Wound VAC ;Patient/family education;Dressing change Wound Therapy - Frequency: 3X / week Wound Therapy - Follow Up Recommendations: Skilled nursing facility Wound Plan: see above  Wound Therapy Goals- Improve the function of patient's integumentary system by progressing the wound(s) through the phases of wound healing (inflammation - proliferation - remodeling) by: Decrease Necrotic Tissue to: 50% Decrease Necrotic Tissue - Progress: Progressing toward goal Increase Granulation Tissue to: 50% Increase Granulation Tissue - Progress: Progressing toward  goal Goals/treatment plan/discharge plan were made with and agreed upon by patient/family: Yes Time For Goal Achievement: 7 days  Wound Therapy - Potential for Goals: Fair  Goals will be updated until maximal potential achieved or discharge criteria met.  Discharge criteria: when goals achieved, discharge from hospital, MD decision/surgical intervention, no progress towards goals, refusal/missing three consecutive treatments without notification or medical reason.  GP     [name] 11/26/2020, 12:41 PM

## 2020-11-26 NOTE — Progress Notes (Signed)
NAMEDoral [name], MRN:  [id number], DOB:  1951-05-03, LOS: 86 ADMISSION DATE:  08/31/2020, CONSULTATION DATE:  09/07/2020 REFERRING MD:  Dr Candace [name], CHIEF COMPLAINT:  Acute resp failure  Brief History   70 year old female who was previously diagnosed with Covid 08/23/2020.  Admitted 11/2 with AF-RVR, found to have a perforated duodenal underwent exploratory laparotomy with Phillip Heal patch placement 11/3.  Past Medical History  Covid pneumonia Atrial fibrillation CKD stage III Diabetes mellitus Hypertension Colon cancer Hyperlipidemia  Significant Hospital Events   11/2 Admitted  11/3 OR with findings of perforated duodenal ulcer 11/10 progressive hemorrhagic shock, intubated, transfused, pressors, proned; started on CRRT in PM 11/03 Exploratory laparotomy, Phillip Heal patch, lysis of adhesion for duodenal ulceration postop day 6 11/16 Extubated. Re-intubated overnight due to respiratory distress and hypoxia with decreased mentation 11/18 Bronch, cultures sent 11/19 Hgb down getting blood 11/24 Spiked fever resume empirical antimicrobial therapy 11/26 Hemorrhagic shock, hgb 5.6, increased pressors, CT A/P  11/30 Per palliative "Candace [name] expresses understanding that patient is unfortunately very fragile despite ongoing intensive medical treatment and full mechanical support. She indicates that the family wants to continue with all current interventions despite potential outcomes". 12/08 CRRT discontinued due to clotting 12/09 Family requested transfer to tertiary care [facility]). [facility] denied transfer  12/10 CRRT restarted. Episodes of tachycardia, tachypnea that seem to improve with pain management 12/11 Back in shock. Pressor requirements up. CXR worse. ABX resumed 12/12 Still requiring inc pressors. Had hypoglycemic event. Basal insulin dosing adjusted 12/13 Pressor requirements better. Now hyperglycemic. Re-adjusted Glycemic control  12/14 Changed dilaudid to1/2 dosing from day further. D/c  vasopressin.  Goals of care reconfirmed with daughter.  Patient continues to desire aggressive care.  Not open to discussing any other option, patient family continues to be hopeful that she will be discharged to home with full recovery in spite of multiple attempts by staff to prepare them that this is unlikely scenario 12/15 Dilaudid discontinued sending cortisol for ongoing pressor dependence 12/16 Serum cortisol <20, added stress dose steroids.  PRN Dilaudid, attempting not go back on Dilaudid infusion 12/17 Developed worsening tachycardia during the evening hours received initially IV albumin, followed by resuming IV Dilaudid with question of suboptimal pain control.  Currently looks better back on Dilaudid drip.  We have been able to wean pressors after adding stress dose steroids; near arrest - bradycardia, better with atropine  12/19 Afebrile . Remains on dilaudid and heparin gtt, dilaudid gtt increased overnight for concern of pain / ongoing tachycardia, no other events . NE and precedex off 12/17. Ongoing CRRT- even UF, dosing lokelmia this morning 12/20 On CRRT.  Renal plans for HD cath and moving to HD. Getting wound care  12/23 -No vent weaning per RT, remains on full support, #8 trach in place but previously had #6, no documentation of change noted in chart Afebrile / WBC 20.4  Vent - 30% FiO2, PEEP 5 Glucose range 176-212 I/O 465 ml stool, 3.6L removed with HD, -1.1L in last 24 hours  RN reports ongoing periods of tachypnea / vent dyssynchrony that responds to dilaudid   12/24 - renal stopping CRRT today and plans HD 10/24/20 . 40% fio2 on vent via Trach. TAchypenic and tachycardic. Afebrile but wbc up to 27.6K. On TF. Onn heparin gtt  12/25 - Back on CRRT. On vent via trach at 40% fio2, On scheduled dilaudid as add on to oxy. Per family request 12/24 - they felt scheduled oxy was not adequate and patient was showing  signs of opioid withdrawal.  Patient also had  worsening SIRS/sepsis  syndrome. Had fever, rising wbc, worsening RR 40-60 and HR 140s sinus-> started On abx yesterday. Fever some better today. WBC plateau at 28,.5K. On new levophed -> signifanct improvement in HR 77 and RR t0 20. On heparin gtt.  On precedex gtt. On levophed gtt 75mcg wthi midodrine. On TF 12/30 Remains on CRRT with intermittent pressor requirements. Ethics consult placed evening of 12/29. Ethics rec time trial of CRRT 12/31 failed SBT with RR 40s. Several conversations between care tam and family, who are upset RE plan of care 1/1 back on pressors  1/4: On pressors, keeping even on CVVHD, HGB drop to 6.9, transfused 1 unit. Improving mental status 1/10 remains on low dose levophed 32mcg 1/11 off levo, attempting HD with UF for first time.  -ethics consulted 12/30-- recs for time limited trial of CRRT, please see separate Ethics consult note for full details. Futility policy reviewed with family by ethics. Ongoing meetings with Dr. Hulen Skains with nursing administration and family. The ultimate goal is to transition her safely to HD from CRRT. 11/16/2020 again will be placed on intermittent hemodialysis. 11/16/2018 wheeze criteria for transition to progressive care since she has been off the ventilator for 72 hours. With an order for transfer to progressive care. 11/18/2020 is doing well on progressive care unit. 1/24 copious secretions 1/27 downsized to #6 cuffed  Consults:  Cardiology [facility] Nephrology  Ethics  Procedures:  R PICC 11/5 >> A line 11/9 >> out ETT 11/9 > 11/16, 11/16 >> 09/21/2020, 09/21/2020 tracheostomy>> Lt Elbert CVL 11/9 >> R IJ trialysis >> out HD catheter 12/1 >>12/20 12/21 - 14.5 Fr, 23 cm right IJ tunneled hemodialysis catheter placement.  Removal of indwelling subclavian catheter.   Significant Diagnostic Tests:  11/3 CT abd/ pelvis > 1. Positive for bowel perforation: Pneumoperitoneum and intermediate density free fluid in the abdomen. Prior total colectomy. The specific  site of perforation is unclear-oral contrast present to the proximal jejunum has not obviously leaked. Note that there may be small bowel loops adherent to the ventral abdominal wall along the greater curve of the stomach. 2. Extensive bilateral lower lung pneumonia. No pleural effusion. 3. Other abdominal and pelvic viscera are stable since 2015, including bilateral adrenal adenomas. Chronic renal parapelvic cysts. 4. Aortic Atherosclerosis 11/3 TTE > EF 70-75%, RV not well visualized, mildly reduced RV systolic function 70/96 CT chest/ abd/ pelvis> 1. Interval progression of diffuse bilateral hazy ground-glass airspace opacities with more focal areas of consolidation at the lung bases 2. Trace bilateral pleural effusions. 3. Postsurgical changes the abdomen as detailed above. No evidence for a postoperative abscess, however evaluation is limited by lack of IV contrast. 4. There is a 1.9 cm cystic appearing lesion located in the pancreatic body. This was not present on the patient's CT from 2015.  Follow-up with an outpatient contrast enhanced MRI is recommended. 5. The endometrial stripe appears diffusely thickened. Follow-up with pelvic ultrasound is recommended. Aortic Atherosclerosis 11/14 LE doppler studies > + DVT of right posterior tibial and peroneal vein, +dVT of left posterior tibial vein  11/26 CT ABD PEL > liver unremarkable, distended gallbladder with layering tiny gallstones versus sludge, no duct dilatation, mild hyperdensity right upper pole renal collecting system new.  No evidence of retroperitoneal bleeding.  Multifocal lower lobe predominant pulmonary infiltrates/pneumonia.  Small left pleural effusion. 1/16 Venous duplex RUE >> negative Micro Data:  11/10 MRSA PCR > neg 11/10 BC x 2 >  neg 09/22/2020 blood cultures x2>> S epi.  09/22/2020 sputum culture>> MRSA Blood 12/1 >> negative. S.epi 1 out of 2, likely contaminant BCx 2 12/12 >> neg xxx Trach aspirate 12/24 - FEW  STAPHYLOCOCCUS AUREUS  Blood 12/24 > Negative  Blood 1/2: neg Sputum 1/4: MSSA resp 1/24 MSSA  Antimicrobials:  azithro 11/2 >11/3 Ceftriaxone 11/2  Fluconazole 11/3 Zosyn 11/3 >> 11/7 Vanc 11/10 off Cefepime 11/10 > 11/16 09/22/2020 vancomycin for MRSA PNA >> 12/2 xxxx Vanc 12/11> 12/17 Zosyn 12/11> 12/17 xxxx vanc 12/24 (staph resp cutlure) >> 12/28 Zosyn 12/24 - 12/27 Cefazolin 12/27 >>1/1 linezolid 1/2 eraxis 1/2  Meropenem 1/2 > 1/8  Subjective:   Tolerated downsizing of trach Minimal secretions New enteric tube being placed  Objective   Blood pressure 130/67, pulse (!) 105, temperature 98.6 F (37 C), temperature source Oral, resp. rate 18, height 5\' 8"  (1.727 m), weight 92.6 kg, SpO2 95 %.    FiO2 (%):  [21 %] 21 %   Intake/Output Summary (Last 24 hours) at 11/26/2020 1219 Last data filed at 11/26/2020 0954 Gross per 24 hour  Intake 360 ml  Output 1150 ml  Net -790 ml   Filed Weights   11/16/20 1500 11/16/20 1802 11/16/20 2120  Weight: 86 kg 85 kg 92.6 kg   Physical Exam  Awake but frail appearing elderly woman Tracheostomy #6 , cuffed Chest  Bl decreased air entry, no accessory muscle use Cardiac S1-S2 tachy Abdomen soft, nontender Extremities are warm and dry   .  Labs show nml lytes, slight increase in leukocytosis Chest x-ray 1/ 26 independently reviewed, patchy lower lung opacity improved from prior no new infiltrates   Resolved problems:  Previously hemorrhagic shock requiring PRBCs, Septic shock, MRSA pneumonia s/p 8 days abx Complicated by MRSA healthcare associated pneumonia (treated) Peritonitis and perforated ulcer post repair 09/01/20 MSSA HAP Vent dependency   Assessment:  Candace [name] is a 70 y.o. with history of COVID 19 infection requiring hospitalization in October 2021 with current admission for hemorrhagic shock secondary to perforated gastric ulcer requiring surgical intervention and [place]. Her medical issues  are as follows:  Acute metabolic encephalopathy : Prolonged critical illness, sedation/pain/anxiety -Can taper Seroquel and clonazepam to off -Decrease clonazepam to nightly, Seroquel to be stopped after 3 days (daughter concerned that anxiety meds are keeping her sedate)   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 status post tracheostomy Stable bilateral pneumonia VDRF Tolerating Passy-Muir valve If continues to improve, can downsize further next week to cuffless, but ideally would like her to improve with PT before we can consider decannulation  MSSA HAP -no new infiltrates but has low-grade fever and leukocytosis Will treat with Ancef for 5 to 7 days   Persistent SVT, sinus tachycardia -Continue metoprolol  AKI Lab Results  Component Value Date   CREATININE 2.59 (H) 11/26/2020   CREATININE 1.86 (H) 11/25/2020   CREATININE 2.15 (H) 11/24/2020    HD Per nephrology  Afib/DVT lower extremity Asymmetric right upper extremity edema/swelling negative ultrasound Per primary    Critical Illness Myopathy Severe deconditioning  DM2 with hyperglycemia CBG (last 3)  Recent Labs    11/25/20 2138 11/26/20 0757 11/26/20 1131  GLUCAP 109* 103* 134*   Stage IV sacral ulcer Per primary , general surgery following  History of stage IV colon cancer 1.9 cm cystic-appearing lesion located in pancreatic body no intervention at this time.   Will need a trach/dialysis facility, daughter optimistic about going  home.  We will consider decannulation only if she makes more progress with PT PCCM to see again next week please call for questions over the weekend   Best practice:  Diet: EN 11/18/2020 Was progressed to p.o. diet after passing swallow evaluation Pain/Anxiety/Delirium protocol (if indicated): Fentanyl patch VAP protocol (if indicated): yes DVT prophylaxis: Eliquis GI prophylaxis: Protonix Glucose control: SSI, TF coverage,  lantus Mobility: PT Family communication: daughter   Kara Mead MD. FCCP. [facility] See Amion for pager  If no response to pager , please call [phone]  After 7:00 pm call Elink  [phone]    11/26/2020, 12:19 PM

## 2020-11-26 NOTE — Progress Notes (Signed)
Occupational Therapy Evaluation Patient Details Name: Candace [name] MRN: [id number] DOB: 08-Mar-1951 Today's Date: 11/26/2020    History of Present Illness 70 y.o. female with medical history significant for recent covid pna, obesity, colon cancer s/p colon resection with colostomy bag, HLD, NIDDM2, CKD3 presented to ED after her follow-up nurse advised her to present to ED for elevated HR. +afib, elevated troponins with demand ischemia,  CT scan is positive for bowel perforation with pneumomediastinum 11/03 exp lap with repair of perforated ulcer. 11/10 t/f to ICU- intubated/sedated/proned. Trach placed 11/24. CRRT off 12/8, restarted 12/10-1/5.   Clinical Impression   PTA, pt was independent at baseline and lives with her husband and daughters. Pt currently requiring Total A for bathing, dressing, toileting, and bed mobility. Pt participating in [place]. Providing pt with U-cuff to optimize participation in self feeding task. Pt performing self feeding tasks with initial Mod A to support elbow and wrist at LUE for pt to bring spoon to mouth. Providing support at elbow with pillow and pt able to bring spoon to mouth with Supervision. Pt would benefit from further acute OT to facilitate safe dc. Recommend dc to LTACH/SNF for further OT to optimize safety, independence with ADLs, and return to PLOF.      Follow Up Recommendations  LTACH;Supervision/Assistance - 24 hour    Equipment Recommendations  Wheelchair (measurements OT);Wheelchair cushion (measurements OT);Hospital bed    Recommendations for Other Services PT consult     Precautions / Restrictions Precautions Precautions: Fall Precaution Comments: baseline colostomy; sacral wound with wound vac      Mobility Bed Mobility Overal bed mobility: Needs Assistance             General bed mobility comments: Total A +2 for reposition    Transfers                 General transfer comment: Defer for safety as  well as plan for HD later today    Balance                                           ADL either performed or assessed with clinical judgement   ADL Overall ADL's : Needs assistance/impaired Eating/Feeding: Moderate assistance;Set up;Bed level Eating/Feeding Details (indicate cue type and reason): Providing pt with U-cuff on LUE. Pt able to bring spoon to mouth using U-cuff with Mod A to support wrist and elbow. Elevating elbow on pillow and pt able to bring spoon to mouth with Supervision. Needing assistance to scoop new ice cream Providing Mod hand over hand at RUE to hold ice cream cup.                                   General ADL Comments: Total A for bathing, dressing, and toileting     Vision Baseline Vision/History: Wears glasses Patient Visual Report: No change from baseline       Perception     Praxis      Pertinent Vitals/Pain Pain Assessment: Faces Faces Pain Scale: Hurts even more Pain Location: with ROM end range Pain Descriptors / Indicators: Grimacing Pain Intervention(s): Monitored during session;Limited activity within patient's tolerance;Repositioned     Hand Dominance Right   Extremity/Trunk Assessment Upper Extremity Assessment Upper Extremity Assessment: RUE deficits/detail;LUE deficits/detail RUE Deficits / Details: Poor grasp  strength. Able to perform composite extension of digits. Difficulty performing composite flexion to make fist. Triceps tight limiting elbow flexion. Attempting to bring hand to mouth in gravity eliminated plane; reaching about ~3 in away from mouth. RUE Coordination: decreased fine motor;decreased gross motor LUE Deficits / Details: Weak grasp strength. AROM for hand. Triceps tight limiting elbow flexion. Able to bring hand to mouth in gravity eliminated plane. LUE Coordination: decreased fine motor;decreased gross motor   Lower Extremity Assessment Lower Extremity Assessment: Defer to PT  evaluation RLE Deficits / Details: trace muscle activation in bil feet and knees.  Very weak and edematous (more so in bil UEs than LEs).  LLE Deficits / Details: trace muscle activation in bil feet and knees.  Very weak and edematous (more so in bil UEs than LEs).    Cervical / Trunk Assessment Cervical / Trunk Assessment: Other exceptions Cervical / Trunk Exceptions: colostomy; open wound with packing s/p exp lap   Communication Communication Communication: Tracheostomy   Cognition Arousal/Alertness: Awake/alert Behavior During Therapy: WFL for tasks assessed/performed Overall Cognitive Status: Impaired/Different from baseline Area of Impairment: Attention;Memory;Following commands;Safety/judgement;Awareness;Problem solving                   Current Attention Level: Sustained Memory: Decreased short-term memory Following Commands: Follows one step commands with increased time Safety/Judgement: Decreased awareness of safety;Decreased awareness of deficits Awareness: Intellectual Problem Solving: Slow processing;Decreased initiation;Difficulty sequencing;Requires verbal cues;Requires tactile cues General Comments: Pt alert, mouthing words around trach in hushed tones.  Participating in self feeding task   General Comments  SpO2 stable on trach    Exercises Exercises: General Upper Extremity;Other exercises General Exercises - Upper Extremity Shoulder Flexion: AAROM;10 reps;Supine;5 reps Elbow Flexion: AAROM;10 reps;Supine;Both Elbow Extension: AAROM;10 reps;Supine;Both Wrist Flexion: Both;Supine;10 reps;AAROM Wrist Extension: Both;10 reps;Supine;AAROM Digit Composite Flexion: Both;10 reps;PROM;Supine;AROM Composite Extension: Both;Supine;10 reps;AROM Other Exercises Other Exercises: scapular elevation; PROM; x10 Other Exercises: Scapular Protraction; PROM; x10   Shoulder Instructions      Home Living Family/patient expects to be discharged to:: Other (Comment)  (LTAC?) Living Arrangements: Spouse/significant other;Children;Other relatives Available Help at Discharge: Family Type of Home: House Home Access: Stairs to enter [employer] of Steps: 4 Entrance Stairs-Rails: Right;Left Home Layout: One level     Bathroom Shower/Tub: Teacher, early years/pre: Standard     Home Equipment: Bedside commode;Walker - 2 wheels   Additional Comments: information taken from last OT evaluation. OT had previously signed off due to medical status      Prior Functioning/Environment Level of Independence: Independent        Comments: had recent admission- was requiring RW and assist to walk during this admission. Is independent at baseline        OT Problem List: Decreased strength;Decreased knowledge of use of DME or AE;Decreased activity tolerance;Cardiopulmonary status limiting activity;Impaired balance (sitting and/or standing);Decreased safety awareness;Decreased cognition;Increased edema      OT Treatment/Interventions: Self-care/ADL training;Therapeutic exercise;Patient/family education;Balance training;Energy conservation;Therapeutic activities;DME and/or AE instruction;Cognitive remediation/compensation;Manual therapy    OT Goals(Current goals can be found in the care plan section) Acute Rehab OT Goals Patient Stated Goal: Not stated OT Goal Formulation: Patient unable to participate in goal setting Time For Goal Achievement: 12/10/20 Potential to Achieve Goals: Fair  OT Frequency: Min 2X/week   Barriers to D/C:            Co-evaluation              AM-PAC OT "6 Clicks" Daily Activity  Outcome Measure Help from another person eating meals?: A Lot Help from another person taking care of personal grooming?: Total Help from another person toileting, which includes using toliet, bedpan, or urinal?: Total Help from another person bathing (including washing, rinsing, drying)?: Total Help from another person  to put on and taking off regular upper body clothing?: Total Help from another person to put on and taking off regular lower body clothing?: Total 6 Click Score: 7   End of Session Nurse Communication: Mobility status  Activity Tolerance: Patient limited by fatigue Patient left: in bed;with call bell/phone within reach  OT Visit Diagnosis: Unsteadiness on feet (R26.81);Other abnormalities of gait and mobility (R26.89);Muscle weakness (generalized) (M62.81);Other symptoms and signs involving cognitive function                Time: 5686-1683 OT Time Calculation (min): 27 min Charges:  OT General Charges $OT Visit: 1 Visit OT Evaluation $OT Eval Moderate Complexity: 1 Mod OT Treatments $Self Care/Home Management : 8-22 mins  Maybelle Depaoli MSOT, OTR/L Acute Rehab Pager: [phone] Office: [place] 11/26/2020, 5:01 PM

## 2020-11-26 NOTE — Procedures (Signed)
Cortrak  Person Inserting Tube:  Pretty Weltman E, RD Tube Type:  Cortrak - 43 inches Tube Location:  Left nare Initial Placement:  Stomach Secured by: Bridle Technique Used to Measure Tube Placement:  Documented cm marking at nare/ corner of mouth Cortrak Secured At:  70 cm   Cortrak Tube Team Note:  Consult received to place a Cortrak feeding tube.   No x-ray is required. RN may begin using tube.   If the tube becomes dislodged please keep the tube and contact the Cortrak team at [email or web address] (password [email or web address]) for replacement.  If after hours and replacement cannot be delayed, place a NG tube and confirm placement with an abdominal x-ray.    Candace Manter, MS, RD, LDN RD pager number and weekend/on-call pager number located in Amion.    

## 2020-11-26 NOTE — Progress Notes (Signed)
Pt resting at time of scheduled trach check. No obvious distress noted, SpO2 97%. RT will check back later.

## 2020-11-26 NOTE — Progress Notes (Signed)
[facility] Progress Note     Assessment:   1. HD dep't AKI.CRRT stopped 11/03/2020.[place] placed on 10/19/20. Family desired ongoing full scope of care, Now on IHD 2. SVT 3. Hyperkalemia; stable 4. H/o mixed shock: hemorrhagic and septic shock; stable 5. Anemia;  on ESA 6. Perforated duodenal ulcer s/p exlap and graham patch placement 7. 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 ARDS 2/2 COVID-19 Pneumonia 9. A.fib/ DVT 10. Severe calorie malnutrition 11. H/o stage IV colon cancer 12. Hypercalcemia- immobility, stable 13. Metabolic Acidosis, resolved  PLAN: - HD next tomorrow (bumped from today due to high census).   Continue current medications. Complex dispo planning noted.  Discussed with daughter again today reiterating home dialysis isn't an option right out of hospital.     Subjective:   No new event.  Lying in bed.  Alert awake and following commands. UOP none, still with intradialytic Cr rise suggestive of need for dialysis. Daughter bedside today.    Objective:   BP 113/77 (BP Location: Left Arm)   Pulse (!) 114   Temp 98.8 F (37.1 C) (Oral)   Resp 20   Ht 5' 8"  (1.727 m)   Wt 92.6 kg   SpO2 98%   BMI 31.04 kg/m   Intake/Output Summary (Last 24 hours) at 11/26/2020 1633 Last data filed at 11/26/2020 1558 Gross per 24 hour  Intake 436 ml  Output 875 ml  Net -439 ml   Weight change:   Physical Exam: Gen: Chronically ill looking female lying on bed comfortable HEENT: trach in place CVS: tachy, S1-S2 normal Resp: unlabored, trach; scattered ant rhonchi Abd: soft, nontender Ext:  No edema appreciated Neuro: follows commands, awake, slow to respond ACCESS: R IJ [facility]  Imaging: DG CHEST PORT 1 VIEW  Result Date: 11/25/2020 CLINICAL DATA:  70 year old female with respiratory failure. COVID-19. EXAM: PORTABLE CHEST 1 VIEW COMPARISON:  Portable chest 11/13/2020 and earlier. FINDINGS: Portable AP semi upright view at 0345 hours. Satisfactory  tracheostomy tube. Stable dual lumen right chest catheter. Superimposed right PICC line is stable. Enteric feeding tube has been removed. Stable low lung volumes with coarse and patchy lower lung opacity. Stable cardiac size and mediastinal contours. No pneumothorax, pulmonary edema or pleural effusion. Ventilation has mildly improved at both lung bases. Paucity of bowel gas in the upper abdomen. Stable visualized osseous structures. IMPRESSION: 1. Enteric feeding tube removed. Otherwise, stable lines and tubes. 2. Sequelae of COVID-19 with mildly improved ventilation in both lower lungs since 11/13/2020. Electronically Signed   By: Genevie Ann M.D.   On: 11/25/2020 05:57    Labs: BMET Recent Labs  Lab 11/20/20 0629 11/21/20 0217 11/22/20 0640 11/24/20 0906 11/25/20 0440 11/26/20 0916  NA 131* 133* 135 137 135 134*  K 4.0 4.4 3.4* 3.8 4.9 4.3  CL 96* 97* 98 99 98 95*  CO2 22 24 22 25 24 25   GLUCOSE 78 187* 114* 101* 133* 105*  BUN 46* 57* 61* 45* 24* 61*  CREATININE 1.19* 1.63* 1.59* 2.15* 1.86* 2.59*  CALCIUM 9.8 10.1 10.5* 10.1 10.6* 10.4*  PHOS  --   --  7.0* 7.1*  --  6.6*   CBC Recent Labs  Lab 11/22/20 0640 11/24/20 0906 11/25/20 0440 11/26/20 0916  WBC 9.2 12.5* 15.4* 15.2*  NEUTROABS  --   --  10.0*  --   HGB 9.5* 8.9* 10.0* 8.9*  HCT 31.6* 28.9* 33.4* 29.5*  MCV 89.0 88.7 88.4 86.8  PLT 365 397 386  410*    Medications:    . apixaban  2.5 mg Oral BID  . chlorhexidine gluconate (MEDLINE KIT)  15 mL Mouth Rinse BID  . Chlorhexidine Gluconate Cloth  6 each Topical Daily  . cholecalciferol  2,000 Units Oral Daily  . [START ON 11/27/2020] clonazepam  0.25 mg Oral QHS  . collagenase   Topical BID  . [START ON 11/27/2020] darbepoetin (ARANESP) injection - DIALYSIS  100 mcg Intravenous Q Sat-HD  . feeding supplement (NEPRO CARB STEADY)  1,000 mL Per Tube Q24H  . feeding supplement (PROSource TF)  45 mL Per Tube TID  . fentaNYL  1 patch Transdermal Q72H  . guaiFENesin  15  mL Oral Q12H  . insulin aspart  0-5 Units Subcutaneous QHS  . insulin aspart  0-9 Units Subcutaneous TID WC  . mouth rinse  15 mL Mouth Rinse QID  . megestrol  200 mg Oral QID  . metoprolol tartrate  50 mg Oral BID  . midodrine  40 mg Oral TID WC  . multivitamin  1 tablet Oral QHS  . nutrition supplement (JUVEN)  1 packet Oral BID BM  . pantoprazole sodium  40 mg Oral BID  . QUEtiapine  12.5 mg Oral QHS  . sodium chloride flush  10-40 mL Intracatheter Q12H      Justin Mend, MD 11/26/2020, 4:33 PM

## 2020-11-26 NOTE — Progress Notes (Signed)
[facility] PROGRESS NOTE  Candace [name] ZOX:[id number] DOB: Jan 14, 1951 DOA: 08/31/2020 PCP: Algis Greenhouse, MD       Status: Remains inpatient appropriate because:Ongoing diagnostic testing needed not appropriate for outpatient work up, Unsafe d/c plan, IV treatments appropriate due to intensity of illness or inability to take PO and Inpatient level of care appropriate due to severity of illness   Dispo: The patient is from: Home              Anticipated d/c is to: SNF recommended-family wishes to take home and states absolutely no institutional care such as SNF or LTAC              Anticipated d/c date is: > 3 days              Patient currently is not medically stable to d/c.  Barriers to discharge: Patient continues to have persistent tachycardia likely related to severe physical deconditioning as well as ongoing tachypnea likely related to persistent metabolic acidemia from kidney disease, continues to require intermittent hemodialysis and is not physically able to/pivot to transfer to wheelchair to proceed with outpatient hemodialysis.  Core track feeding tube clogged has been removed.  Family wishes to not resume artificial feedings at this juncture.  They feel patient's oversedation from medications previously used to treat delirium are contributing to her lack of intake of food and lack of alertness.  Once these medications have been weaned and discontinued an appropriate washout time has been achieved we will revisit possibility of PEG tube if patient still too weak to eat  Considerations for home discharge: 1. Patient will need to continue wound VAC and current wound VAC is apparently not available in the home setting 2. It is likely she will need a specialty bed.  We are currently transitioning back to the [place] rotational bed which will allow for better in bed mobility including the ability to "stand" with PT in the bed without actually getting patient out of bed 3. Likely would  need to be decannulated to minimize other care issues after discharge 4. Would need to be able to either mobilize for outpatient hemodialysis or be a candidate for home dialysis 5. Would not have aggressive PT OT available in the home setting that she can achieve during hospitalization 6. Patient will have family members that are CNAs managing her after discharge but this would be 24/7 requirement and this could be an overwhelming issue once patient is actually home  Code Status: Full Family Communication: 1/27 daughter [name] at bedside DVT prophylaxis: Eliquis Vaccination status: Has not been vaccinated but did have severe COVID infection initially diagnosed on 08/23/2020.  Patient will be eligible for COVID-vaccine after 11/23/2020   Foley catheter: No, purewick female urinary collection device   HPI: 70 year old female patient with prior history of diabetes, stage III chronic kidney disease, atrial fibrillation, hypertension, dyslipidemia and colon cancer that is post colectomy and colostomy.  Initially diagnosed with COVID on 08/23/20 she was admitted to the hospital due to dehydration, acute kidney injury and hypoxemic respiratory failure.  She was discharged home on 2 to 3 L of oxygen after being treated with IV steroids and remdesivir.  During that time although she had elevated inflammatory markers she had no embolic or thrombotic disease.  She had been on prophylactic dose heparin during the hospitalization and was placed on Eliquis for 2 weeks for after discharge  Patient returned to the ER on 08/31/2020 for complaints of not feeling well.  She was found to be in atrial fibrillation with RVR, hemorrhagic shock and work-up revealed a perforated duodenal ulcer.  She underwent exploratory laparotomy on 11/3.  She has had an extensive ICU stay with significant events as below.   11/2 Admitted with rapid Afib 11/3 OR with findings of perforated duodenal ulcer 11/10 progressive hemorrhagic  shock, intubated, transfused, pressors, proned; started on CRRT in PM 11/16 Extubated. Re-intubated overnight due to respiratory distress and hypoxia with decreased mentation 11/18 Bronch, cultures sent 11/19 Hgb down getting blood 11/24 Spiked fever, resume empirical antimicrobial therapy 11/26 Hemorrhagic shock, hgb 5.6, increased pressors,  11/30 Per palliative "[name] understanding that patient is unfortunately very fragiledespite ongoing intensive medical treatment and full mechanical support. [name] that the familywantsto continue with all current interventions despite potential outcomes". 12/08 CRRT discontinued due to clotting 12/09 Family requested transfer to tertiary care [facility] denied transfer  12/10 CRRT restarted. Episodes of tachycardia, tachypnea that seem to improve with pain management 12/11 Back in shock. Pressor requirements up. CXR worse. ABX resumed 12/12 Still requiring inc pressors. Had hypoglycemic event. Basal insulin dosing adjusted 12/13 Pressor requirements better. Now hyperglycemic. Re-adjusted Glycemic control  12/19 Afebrile . Remains on dilaudid and heparin gtt, dilaudid gtt increased overnight for concern of pain / ongoing tachycardia, no other events . NE and precedex off 12/17. Ongoing CRRT- even UF, dosing lokelmia this morning 12/20 On CRRT. Renal plans for HD cath and moving to HD. Getting wound care 12/24 - renal stopping CRRT today and plans HD 10/24/20 . 40% fio2 on vent via Trach. TAchypenic and tachycardic. Afebrile but wbc up to 27.6K. On TF. On heparin gtt 12/25 - Back on CRRT. On vent via trach at 40% fio2, On scheduled dilaudid as add on to oxy. Per family request 12/24 - they felt scheduled oxy was not adequate and patient was showing signs of opioid withdrawal.  Patient also had worsening SIRS/sepsis syndrome. Had fever, rising wbc, worsening RR 40-60 and HR 140s sinus->started On abx yesterday. Fever some better today.  WBC plateau at 28,.5K. On new levophed ->signifanct improvement in HR 77 and RR t0 20. On heparin gtt. On precedex gtt. On levophed gtt 60mg wthi midodrine. On TF 12/30 Remains on CRRT with intermittent pressor requirements. Ethics consult placed 12/29. Ethics rec time trial of CRRT 12/31 failed SBT with RR 40s. Several conversations between care tam and family, who are upset RE plan of care 1/1 back on pressors  1/4: On pressors, keeping even on CVVHD, HGB drop to 6.9, transfused 1 unit. Improving mental status 1/10 remains on low dose levophed 251m 1/11 off levo, attempting HD with UF for first time. Now tolerating intermittent HD. 1/19 patient transferred to progressive bed with tracheostomy on 35% FiO2.  has NG tube feeding.  No PEG tube.  Received dialysis on 1/18. 1/24 significant improvement and persistent tachycardia after introduction of twice daily beta-blocker.  Heart rates have improved from the 130s to the 98-104 range 1/24 core track clogged and therefore has been removed by nutrition team.  Calorie count in progress and we are weaning any sedating medications which could be contributing to patient's inability to eat. 1/27 continue w/ cuff #6 trach   Subjective: Alert and sitting up in bed.  Tech at bedside performing a.m. care.  Breakfast tray ready to be consumed.  Patient without any specific complaints.  Discussed with patient need to perform active range of motion while in bed periodically    Objective: Vitals:  11/25/20 2309 11/26/20 0354  BP: 138/80 134/86  Pulse: 93 (!) 107  Resp: 20 20  Temp: 98.5 F (36.9 C) 97.8 F (36.6 C)  SpO2: 100% 100%    Intake/Output Summary (Last 24 hours) at 11/26/2020 0728 Last data filed at 11/25/2020 2134 Gross per 24 hour  Intake --  Output 1200 ml  Net -1200 ml   Filed Weights   11/16/20 1500 11/16/20 1802 11/16/20 2120  Weight: 86 kg 85 kg 92.6 kg    Exam: Constitutional: Alert, calm, no acute distress Respiratory:  Lungs are coarse to auscultation, no significant secretions noted from trach, trach collar in place, FiO2 21%. #6 cuffed trach Cardiovascular: Persistent tachycardia with stable blood pressure, normal heart sounds, extremities warm to touch with adequate capillary refill Abdomen: On D2 diet with thin liquids. LBM 1/26-core track tube to be replaced today by nutrition team, abdomen soft nontender with appropriate bowel sounds Skin:  Massive sacral decubitus ulcer-wound VAC now in place-no other lesions Neurologic: CN 2-12 grossly intact. Sensation intact, DTR normal. Strength 1/5 x all 4 extremities.  Patient unable to lift legs off bed or bend knees at all.  She is barely able to lift her arms off the bed but was unable to touch her nose with either hand. Psychiatric: Normal judgment and insight. Alert and oriented x 3. Normal mood.    Assessment/Plan: Acute problems: PAF maintaining sinus rhythm w/persistent tachycardia -Continue metoprolol as BP tolerates-persistent sinus tachycardia secondary to deconditioning that worsens with activity and hemodialysis -Cardiology has seen in consultation -Echocardiogram this admission with an EF 50 to 55% with mild diastolic dysfunction parameters and normal RV systolic function -Continue Eliquis-benefits coordinator has determined that Eliquis is covered at [facility] in [place] with a co-pay of $47  Acute respiratory failure secondary to COVID-pneumonia/tracheostomy -Patient stable on low-flow oxygen FiO2 21% -PCCM/trach team following -Repeat respiratory culture on 1/24 with abundant staph with sensitivities pending -Chest x-ray on 1/27 only demonstrates expected sequela of Covid pneumonia with improved ventilation in both lower lungs since 1/15.  This does not appear to be consistent with acute infection although her white count has increased to 15,400- on 1/26 she did have a T-max of 100.1 F.  Defer to PCCM as to whether appropriate to initiate  antibiotic -Continue  suctioning and chest PT-Vibra vest -Continue PMV training   Acute kidney injury secondary to COVID-related sepsis with shock superimposed on stage III chronic kidney disease -Continue intermittent hemodialysis as directed by the renal team -Avoid nephrotoxic medications -Continue Aranesp on dialysis days  Dysphagia/moderate to severe protein calorie malnutrition Nutrition Status: Nutrition Problem: Increased nutrient needs Etiology: wound healing Signs/Symptoms: estimated needs Interventions: Refer to RD note for recommendations  Estimated body mass index is 31.04 kg/m as calculated from the following:   Height as of this encounter: 5' 8"  (1.727 m).   Weight as of this encounter: 92.6 kg.  -Begin calorie count-upon review of documentation of intake patient is only eating about 10% of meals-core track became clogged and has been removed by nutrition team. She has now been switched to D2 nectar thick diet. -Also tapering and discontinuing any offending medications that may be contributing to poor appetite or alertness -Nutrition team had a long discussion with patient's daughter at bedside 1/27.  Daughter amenable at this time to have core track replaced but for utilization for meds and nocturnal feedings only.  It is hopeful that this strategy will help improve patient's oral intake while maintaining adequate caloric intake -Gust  with surgery.  If enteral access needed for extended.  Would recommend contacting IR first and if they are unable to proceed with gastrostomy tube and to contact surgical team. -1/27 will initiate Megace for appetite stimulant.  This medication can precipitate thrombotic events but patient is fully anticoagulated with Eliquis.  Acute hemorrhagic shock secondary to perforated duodenal ulcer/anemia of critical illness -Stable postoperatively -Has tolerated tube feedings and oral diet without any evidence of postoperative obstructive issues which  can be common after duodenal oversewing with subsequent scar tissue formation -Hemoglobin stable today 8.6 since admission  Diabetes mellitus 2 -Well controlled -cont SSI -HgbA1c 08/24/2020 and was 7.0 -Prior to admission patient was on metformin XR  Profound physical deconditioning/acute encephalopathy secondary to prolonged hospital stay/orthostasis -SNF recommended but family wants to take patient home -At this juncture patient unable to sit up in bed independently much less stand pivot and rotate to get into wheelchair and would be significant fall risk and care risk at home -In addition patient would need to be able to get in wheelchair to transport to dialysis if dialysis were to be continued in the outpatient setting -Evaluating for possible LTAC placement-family refuses this as well as SNF rehab due to pt prior request to NEVER be placed in any type of facility -Patient oriented to name and place at this juncture.  Seroquel tapering for next 3 days and Klonopin is being tapered over the next 3 days as well -Continue high-dose midodrine 3 times daily -Persistent tachycardia secondary to severe deconditioning has improved with the introduction of beta-blockers -1/27 begin nurse driven PROM every 4 hours -FAMILY THAT WILL PROVIDE CARE FOR PT WILL NEED TO ASSIST WITH CARE OF THIS PT AT [place]  Acute encephalopathy with ICU delirium -Delirium has resolved -Seroquel taper initiated this week.  Have decreased to 25 mg HS the next 3 nights then will discontinue -Continue Klonopin 0.25 mg BID for 3 more days and discontinue -Duragesic taper dose decreased as above  Stage IV sacral decubitus -Not present on admission -Wound care nurse following and utilizing Santyl and hydrotherapy and now has wound VAC in place -Surgical team consulted.  Current recommendation is against pursuing general anesthesia to undergo extensive surgical procedure-hopefully can be reevaluated in  the future if wound does not heal adequately.  As of 1/28 they have nothing further acutely to add but will be following peripherally. [place] rotational bed  -Continue Duragesic patch for pain but will decrease dosage to 25 mcg every 72 hours since this medication has a long half-life and is excreted in the urine therefore can accumulate in between dialysis sessions.  Continue low-dose oxycodone for breakthrough pain -  11/24/2020 after [facility]      11/22/2020  Incision (Closed) 09/01/20 Abdomen (Active)  Date First Assessed/Time First Assessed: 09/01/20 1737   Location: Abdomen    Assessments 09/01/2020  6:25 PM 11/21/2020  7:47 PM  Dressing Type Gauze (Comment) --  Site / Wound Assessment Dressing in place / Unable to assess --  Drainage Amount None --  Treatment -- Other (Comment)     No Linked orders to display     Pressure Injury 09/17/20 Ear Left;Anterior;Posterior Stage 2 -  Partial thickness loss of dermis presenting as a shallow open injury with a red, pink wound bed without slough. (Active)  Date First Assessed/Time First Assessed: 09/17/20 0800   Location: Ear  Location Orientation: Left;Anterior;Posterior  Staging: Stage 2 -  Partial thickness loss of dermis presenting  as a shallow open injury with a red, pink wound bed without slough. ...    Assessments 09/17/2020  8:00 AM 11/21/2020  7:47 PM  Dressing Type Foam - Lift dressing to assess site every shift None  Dressing Clean;Dry;Intact --  Dressing Change Frequency Every 3 days --  Site / Wound Assessment Dry;Pink --  Peri-wound Assessment Intact --  Wound Length (cm) 2 cm --  Wound Width (cm) 1 cm --  Wound Depth (cm) 0.25 cm --  Wound Surface Area (cm^2) 2 cm^2 --  Wound Volume (cm^3) 0.5 cm^3 --  Margins Unattached edges (unapproximated) --  Drainage Amount Scant --  Drainage Description Serosanguineous --  Treatment Cleansed --     No Linked orders to display     Pressure Injury 09/25/20 Sacrum Bilateral;Medial Deep  Tissue Pressure Injury - Purple or maroon localized area of discolored intact skin or blood-filled blister due to damage of underlying soft tissue from pressure and/or shear. Purple, non-blanchable, b (Active)  Date First Assessed/Time First Assessed: 09/25/20 2000   Location: Sacrum  Location Orientation: Bilateral;Medial  Staging: Deep Tissue Pressure Injury - Purple or maroon localized area of discolored intact skin or blood-filled blister due to damage o...    Assessments 09/25/2020  5:00 PM 11/24/2020  3:31 PM  Dressing Type Foam - Lift dressing to assess site every shift Other (Comment)  Dressing Changed;Clean;Dry;Intact Other (Comment)  Dressing Change Frequency -- Every other day  State of Healing -- Early/partial granulation  Site / Wound Assessment -- Yellow;Red  % Wound base Red or Granulating -- 10%  % Wound base Yellow/Fibrinous Exudate -- 80%  % Wound base Black/Eschar -- 10%  % Wound base Other/Granulation Tissue (Comment) -- 0%  Peri-wound Assessment -- Intact  Margins -- Unattached edges (unapproximated)  Drainage Amount -- Minimal  Drainage Description -- Serous  Treatment -- Hydrotherapy (Pulse lavage);Debridement (Selective);Negative pressure wound therapy     No Linked orders to display     Negative Pressure Wound Therapy Sacrum (Active)  Placement Date/Time: 11/22/20 1500   Wound Type: (c) Other (Comment)  Location: Sacrum    Assessments 11/22/2020  4:59 PM 11/25/2020  9:34 PM  Last dressing change 11/22/20 --  Site / Wound Assessment Granulation tissue;Pale;Yellow --  Peri-wound Assessment Intact --  Size see above --  Wound filler - Black foam 2 --  Wound filler - White foam 0 --  Wound filler - Nonadherent 0 --  Wound filler - Gauze 0 --  Cycle Continuous --  Target Pressure (mmHg) 125 --  Instillation Volume 26 mL --  Instillation Solution Normal Saline --  Instillation Soak Time 10 minutes --  Instillation Therapy Time 3.5 hours --  Canister Changed No  --  Dressing Status Intact --  Drainage Amount None --  Output (mL) -- 125 mL     No Linked orders to display    Hypomagnesemia -Continue oral magnesium -1/21 give 4 g magnesium IV x1 -Follow labs  Other problems: History of stage IV colon cancer -Old colostomy  DVT bilateral posterior tibial veins -Was not present during initial COVID admission -Continue Eliquis for now given severe debility and increased risk for developing recurrent DVT  Hypertension -Having issues with orthostasis requiring midodrine Prior to admission patient was on Norvasc  Dyslipidemia -Prior to admission patient was on Crestor-consider resuming soon once patient can swallow pills whole  Abnormal TSH -Nov 2021 TSH was 0.015-TSH and free T4, T3 normal this admission  Data Reviewed: Basic Metabolic Panel:  Recent Labs  Lab 11/20/20 0629 11/21/20 0217 11/22/20 0640 11/24/20 0906 11/25/20 0440  NA 131* 133* 135 137 135  K 4.0 4.4 3.4* 3.8 4.9  CL 96* 97* 98 99 98  CO2 22 24 22 25 24   GLUCOSE 78 187* 114* 101* 133*  BUN 46* 57* 61* 45* 24*  CREATININE 1.19* 1.63* 1.59* 2.15* 1.86*  CALCIUM 9.8 10.1 10.5* 10.1 10.6*  MG 2.9*  --   --   --   --   PHOS  --   --  7.0* 7.1*  --    Liver Function Tests: Recent Labs  Lab 11/22/20 0640 11/24/20 0906  ALBUMIN 2.5* 2.5*   No results for input(s): LIPASE, AMYLASE in the last 168 hours. No results for input(s): AMMONIA in the last 168 hours. CBC: Recent Labs  Lab 11/19/20 1100 11/22/20 0640 11/24/20 0906 11/25/20 0440  WBC 10.4 9.2 12.5* 15.4*  NEUTROABS  --   --   --  10.0*  HGB 8.6* 9.5* 8.9* 10.0*  HCT 29.8* 31.6* 28.9* 33.4*  MCV 91.4 89.0 88.7 88.4  PLT 352 365 397 386   Cardiac Enzymes: No results for input(s): CKTOTAL, CKMB, CKMBINDEX, TROPONINI in the last 168 hours. BNP (last 3 results) Recent Labs    09/07/20 0118 09/08/20 0446 09/09/20 0428  BNP 216.8* 432.5* 609.7*    ProBNP (last 3 results) No results for  input(s): PROBNP in the last 8760 hours.  CBG: Recent Labs  Lab 11/24/20 1935 11/25/20 0734 11/25/20 1258 11/25/20 1632 11/25/20 2138  GLUCAP 98 119* 140* 143* 109*    Recent Results (from the past 240 hour(s))  Culture, respiratory (non-expectorated)     Status: None   Collection Time: 11/22/20  4:45 PM   Specimen: Tracheal Aspirate; Respiratory  Result Value Ref Range Status   Specimen Description TRACHEAL ASPIRATE  Final   Special Requests NONE  Final   Gram Stain   Final    ABUNDANT WBC PRESENT,BOTH PMN AND MONONUCLEAR ABUNDANT GRAM POSITIVE COCCI IN PAIRS Performed at [facility], [place] [address], [place], [place] [address]    Culture MODERATE STAPHYLOCOCCUS AUREUS  Final   Report Status 11/25/2020 FINAL  Final   Organism ID, Bacteria STAPHYLOCOCCUS AUREUS  Final      Susceptibility   Staphylococcus aureus - MIC*    CIPROFLOXACIN <=0.5 SENSITIVE Sensitive     ERYTHROMYCIN <=0.25 SENSITIVE Sensitive     GENTAMICIN <=0.5 SENSITIVE Sensitive     OXACILLIN <=0.25 SENSITIVE Sensitive     TETRACYCLINE <=1 SENSITIVE Sensitive     VANCOMYCIN <=0.5 SENSITIVE Sensitive     TRIMETH/SULFA <=10 SENSITIVE Sensitive     CLINDAMYCIN <=0.25 SENSITIVE Sensitive     RIFAMPIN <=0.5 SENSITIVE Sensitive     Inducible Clindamycin NEGATIVE Sensitive     * MODERATE STAPHYLOCOCCUS AUREUS     Studies: DG CHEST PORT 1 VIEW  Result Date: 11/25/2020 CLINICAL DATA:  70 year old female with respiratory failure. COVID-19. EXAM: PORTABLE CHEST 1 VIEW COMPARISON:  Portable chest 11/13/2020 and earlier. FINDINGS: Portable AP semi upright view at 0345 hours. Satisfactory tracheostomy tube. Stable dual lumen right chest catheter. Superimposed right PICC line is stable. Enteric feeding tube has been removed. Stable low lung volumes with coarse and patchy lower lung opacity. Stable cardiac size and mediastinal contours. No pneumothorax, pulmonary edema or pleural effusion. Ventilation has mildly  improved at both lung bases. Paucity of bowel gas in the upper abdomen. Stable visualized osseous structures. IMPRESSION: 1. Enteric feeding  tube removed. Otherwise, stable lines and tubes. 2. Sequelae of COVID-19 with mildly improved ventilation in both lower lungs since 11/13/2020. Electronically Signed   By: Genevie Ann M.D.   On: 11/25/2020 05:57    Scheduled Meds: . (feeding supplement) PROSource Plus  30 mL Oral TID WC  . apixaban  2.5 mg Oral BID  . chlorhexidine gluconate (MEDLINE KIT)  15 mL Mouth Rinse BID  . Chlorhexidine Gluconate Cloth  6 each Topical Daily  . cholecalciferol  2,000 Units Oral Daily  . clonazepam  0.25 mg Oral BID  . collagenase   Topical BID  . darbepoetin (ARANESP) injection - DIALYSIS  100 mcg Intravenous Q Tue-HD  . fentaNYL  1 patch Transdermal Q72H  . guaiFENesin  15 mL Oral Q12H  . insulin aspart  0-5 Units Subcutaneous QHS  . insulin aspart  0-9 Units Subcutaneous TID WC  . mouth rinse  15 mL Mouth Rinse QID  . megestrol  200 mg Oral QID  . metoprolol tartrate  50 mg Oral BID  . midodrine  40 mg Oral TID WC  . multivitamin  1 tablet Oral QHS  . nutrition supplement (JUVEN)  1 packet Oral BID BM  . pantoprazole sodium  40 mg Oral BID  . QUEtiapine  12.5 mg Oral QHS  . sodium chloride flush  10-40 mL Intracatheter Q12H   Continuous Infusions: . sodium chloride 5 mL/hr at 11/16/20 0935  . sodium chloride    . albumin human 25 g (11/16/20 1516)    Principal Problem:   Atrial fibrillation with RVR ([place]) Active Problems:   Acute respiratory failure due to COVID-19 [facility])   New onset a-fib (HCC)   Leukocytosis   Atrial fibrillation with rapid ventricular response (HCC)   Hypoxia   Pressure injury of skin   Acute hypoxemic respiratory failure (HCC)   ARDS (adult respiratory distress syndrome) (HCC)   Perforated duodenal ulcer ([place])   On mechanically assisted ventilation ([place])   Palliative care by specialist   Goals of care,  counseling/discussion   Shock ([place])   Acute and chronic respiratory failure (acute-on-chronic) ([place])   Status post tracheostomy ([place])   ESRD (end stage renal disease) [facility])   Consultants:  Cardiology  Surgery  Nephrology  Ethics  PCCM   Procedures: R PICC 11/5 >> A line 11/9 >> out ETT 11/9 > 11/16, 11/16 >> 09/21/2020, 09/21/2020 tracheostomy>> Lt [place] CV[facility] 11/9 >> R IJ trialysis >> out HD catheter 12/1 >>12/20 12/21 - 14.5 Fr, 23 cm right IJ tunneled hemodialysis catheter placement. Removal of indwelling subclavian catheter.   Antibiotics: Anti-infectives (From admission, onward)   Start     Dose/Rate Route Frequency Ordered Stop   11/17/20 1415  fluconazole (DIFLUCAN) 40 MG/ML suspension 200 mg        200 mg Oral  Once 11/17/20 1315 11/17/20 1357   11/03/20 2200  meropenem (MERREM) 500 mg in sodium chloride 0.9 % 100 mL IVPB        500 mg 200 mL/hr over 30 Minutes Intravenous Every 24 hours 11/03/20 1500 11/06/20 2217   11/01/20 1000  anidulafungin (ERAXIS) 100 mg in sodium chloride 0.9 % 100 mL IVPB  Status:  Discontinued       "Followed by" Linked Group Details   100 mg 78 mL/hr over 100 Minutes Intravenous Every 24 hours 10/31/20 0916 11/01/20 1404   10/31/20 1015  meropenem (MERREM) 1 g in sodium chloride 0.9 % 100 mL IVPB  Status:  Discontinued  1 g 200 mL/hr over 30 Minutes Intravenous Every 8 hours 10/31/20 0916 11/03/20 1500   10/31/20 1015  linezolid (ZYVOX) IVPB 600 mg  Status:  Discontinued        600 mg 300 mL/hr over 60 Minutes Intravenous Every 12 hours 10/31/20 0916 11/02/20 0906   10/31/20 1015  anidulafungin (ERAXIS) 200 mg in sodium chloride 0.9 % 200 mL IVPB       "Followed by" Linked Group Details   200 mg 78 mL/hr over 200 Minutes Intravenous  Once 10/31/20 0916 10/31/20 1630   10/25/20 1800  ceFAZolin (ANCEF) IVPB 2g/100 mL premix  Status:  Discontinued        2 g 200 mL/hr over 30 Minutes Intravenous Every 12 hours 10/25/20 1022  10/30/20 1103   10/23/20 2000  vancomycin (VANCOREADY) IVPB 750 mg/150 mL  Status:  Discontinued        750 mg 150 mL/hr over 60 Minutes Intravenous Every 24 hours 10/22/20 2036 10/25/20 1022   10/23/20 1800  piperacillin-tazobactam (ZOSYN) IVPB 3.375 g  Status:  Discontinued        3.375 g 100 mL/hr over 30 Minutes Intravenous Every 6 hours 10/23/20 1155 10/24/20 1422   10/23/20 0200  piperacillin-tazobactam (ZOSYN) IVPB 3.375 g  Status:  Discontinued        3.375 g 100 mL/hr over 30 Minutes Intravenous Every 8 hours 10/22/20 2036 10/23/20 1155   10/22/20 1630  vancomycin (VANCOREADY) IVPB 1500 mg/300 mL        1,500 mg 150 mL/hr over 120 Minutes Intravenous  Once 10/22/20 1537 10/22/20 2229   10/22/20 1630  piperacillin-tazobactam (ZOSYN) IVPB 2.25 g  Status:  Discontinued        2.25 g 100 mL/hr over 30 Minutes Intravenous Every 8 hours 10/22/20 1537 10/22/20 2036   10/22/20 1537  vancomycin variable dose per unstable renal function (pharmacist dosing)  Status:  Discontinued         Does not apply See admin instructions 10/22/20 1537 10/22/20 2036   10/19/20 0600  ceFAZolin (ANCEF) IVPB 2g/100 mL premix        2 g 200 mL/hr over 30 Minutes Intravenous To Radiology 10/18/20 1457 10/19/20 0938   10/10/20 0830  vancomycin (VANCOCIN) IVPB 1000 mg/200 mL premix       "Followed by" Linked Group Details   1,000 mg 200 mL/hr over 60 Minutes Intravenous Every 24 hours 10/09/20 0744 10/15/20 0902   10/09/20 0830  piperacillin-tazobactam (ZOSYN) IVPB 3.375 g        3.375 g 100 mL/hr over 30 Minutes Intravenous Every 6 hours 10/09/20 0744 10/16/20 0038   10/09/20 0830  vancomycin (VANCOREADY) IVPB 2000 mg/400 mL       "Followed by" Linked Group Details   2,000 mg 200 mL/hr over 120 Minutes Intravenous  Once 10/09/20 0744 10/09/20 1022   09/24/20 1000  ceFAZolin (ANCEF) IVPB 2g/100 mL premix  Status:  Discontinued        2 g 200 mL/hr over 30 Minutes Intravenous Every 12 hours 09/24/20 0801  09/24/20 1046   09/23/20 1400  vancomycin (VANCOCIN) IVPB 1000 mg/200 mL premix        1,000 mg 200 mL/hr over 60 Minutes Intravenous Every 24 hours 09/22/20 1436 09/28/20 1718   09/22/20 2200  ceFEPIme (MAXIPIME) 2 g in sodium chloride 0.9 % 100 mL IVPB  Status:  Discontinued        2 g 200 mL/hr over 30 Minutes Intravenous Every 12 hours 09/22/20  1436 09/24/20 0801   09/22/20 1030  ceFEPIme (MAXIPIME) 1 g in sodium chloride 0.9 % 100 mL IVPB        1 g 200 mL/hr over 30 Minutes Intravenous  Once 09/22/20 0934 09/22/20 1145   09/22/20 1015  vancomycin (VANCOCIN) IVPB 1000 mg/200 mL premix        1,000 mg 200 mL/hr over 60 Minutes Intravenous  Once 09/22/20 0934 09/22/20 1446   09/12/20 2200  ceFEPIme (MAXIPIME) 2 g in sodium chloride 0.9 % 100 mL IVPB        2 g 200 mL/hr over 30 Minutes Intravenous Every 12 hours 09/12/20 0732 09/14/20 2134   09/11/20 1400  ceFEPIme (MAXIPIME) 2 g in sodium chloride 0.9 % 100 mL IVPB  Status:  Discontinued        2 g 200 mL/hr over 30 Minutes Intravenous Every 8 hours 09/11/20 1126 09/12/20 0732   09/08/20 1000  vancomycin (VANCOREADY) IVPB 2000 mg/400 mL        2,000 mg 200 mL/hr over 120 Minutes Intravenous  Once 09/08/20 0857 09/08/20 1224   09/08/20 1000  ceFEPIme (MAXIPIME) 2 g in sodium chloride 0.9 % 100 mL IVPB  Status:  Discontinued        2 g 200 mL/hr over 30 Minutes Intravenous Every 12 hours 09/08/20 0857 09/11/20 1126   09/08/20 0856  vancomycin variable dose per unstable renal function (pharmacist dosing)  Status:  Discontinued         Does not apply See admin instructions 09/08/20 0857 09/09/20 0935   09/02/20 1600  cefTRIAXone (ROCEPHIN) 1 g in sodium chloride 0.9 % 100 mL IVPB  Status:  Discontinued        1 g 200 mL/hr over 30 Minutes Intravenous Every 24 hours 09/01/20 1811 09/02/20 0838   09/01/20 1800  fluconazole (DIFLUCAN) IVPB 400 mg        400 mg 50 mL/hr over 240 Minutes Intravenous  Once 09/01/20 1749 09/02/20 0603    09/01/20 1530  piperacillin-tazobactam (ZOSYN) IVPB 3.375 g        3.375 g 12.5 mL/hr over 240 Minutes Intravenous Every 8 hours 09/01/20 1514 09/05/20 2111   09/01/20 1000  levofloxacin (LEVAQUIN) tablet 250 mg  Status:  Discontinued        250 mg Oral Daily 08/31/20 1508 08/31/20 1735   08/31/20 1730  cefTRIAXone (ROCEPHIN) 1 g in sodium chloride 0.9 % 100 mL IVPB  Status:  Discontinued        1 g 200 mL/hr over 30 Minutes Intravenous Every 24 hours 08/31/20 1726 09/01/20 1513   08/31/20 1730  azithromycin (ZITHROMAX) 500 mg in sodium chloride 0.9 % 250 mL IVPB  Status:  Discontinued        500 mg 250 mL/hr over 60 Minutes Intravenous Every 24 hours 08/31/20 1726 09/02/20 0838       Time spent: 30 minutes    [name] ANP  [facility] 7 am - 330 pm/M-F for direct patient care and secure chat Please refer to Amion for contact info 86  days

## 2020-11-26 NOTE — Progress Notes (Signed)
Pharmacy Antibiotic Note  Candace [name] is a 70 y.o. female admitted on 08/31/2020 with pneumonia.  Pharmacy has been consulted for Cefazolin dosing. Patient with renal failure on iHD.  Plan: Cefazolin 1 gm IV q24hr Monitor dialysis schedule and clinical status  Height: 5\' 8"  (172.7 cm) Weight:  (Floor to weigh) IBW/kg (Calculated) : 63.9  Temp (24hrs), Avg:98.8 F (37.1 C), Min:97.8 F (36.6 C), Max:99.4 F (37.4 C)  Recent Labs  Lab 11/21/20 0217 11/22/20 0640 11/24/20 0906 11/25/20 0440 11/26/20 0916  WBC  --  9.2 12.5* 15.4* 15.2*  CREATININE 1.63* 1.59* 2.15* 1.86* 2.59*    Estimated Creatinine Clearance: 24.4 mL/min (A) (by C-G formula based on SCr of 2.59 mg/dL (H)).    No Known Allergies  Antimicrobials: Cefazolin 1/28 >>   Thank you for allowing pharmacy to be a part of this patient's care.  Alanda Slim, PharmD, [facility] Clinical Pharmacist Please see AMION for all Pharmacists' Contact Phone Numbers 11/26/2020, 12:50 PM

## 2020-11-27 ENCOUNTER — Inpatient Hospital Stay (HOSPITAL_COMMUNITY): Payer: Medicare Other

## 2020-11-27 DIAGNOSIS — I4891 Unspecified atrial fibrillation: Secondary | ICD-10-CM | POA: Diagnosis not present

## 2020-11-27 LAB — RENAL FUNCTION PANEL
Albumin: 2.6 g/dL — ABNORMAL LOW (ref 3.5–5.0)
Anion gap: 17 — ABNORMAL HIGH (ref 5–15)
BUN: 103 mg/dL — ABNORMAL HIGH (ref 8–23)
CO2: 20 mmol/L — ABNORMAL LOW (ref 22–32)
Calcium: 9.5 mg/dL (ref 8.9–10.3)
Chloride: 94 mmol/L — ABNORMAL LOW (ref 98–111)
Creatinine, Ser: 2.78 mg/dL — ABNORMAL HIGH (ref 0.44–1.00)
GFR, Estimated: 18 mL/min — ABNORMAL LOW (ref >60)
Glucose, Bld: 229 mg/dL — ABNORMAL HIGH (ref 70–99)
Phosphorus: 7.2 mg/dL — ABNORMAL HIGH (ref 2.5–4.6)
Potassium: 4.6 mmol/L (ref 3.5–5.1)
Sodium: 131 mmol/L — ABNORMAL LOW (ref 135–145)

## 2020-11-27 LAB — GLUCOSE, CAPILLARY
Glucose-Capillary: 118 mg/dL — ABNORMAL HIGH (ref 70–99)
Glucose-Capillary: 145 mg/dL — ABNORMAL HIGH (ref 70–99)
Glucose-Capillary: 198 mg/dL — ABNORMAL HIGH (ref 70–99)

## 2020-11-27 MED ORDER — HEPARIN SODIUM (PORCINE) 1000 UNIT/ML IJ SOLN
INTRAMUSCULAR | Status: AC
  Administered 2020-11-27: 1000 [IU]
  Filled 2020-11-27: qty 4

## 2020-11-27 MED ORDER — METOPROLOL TARTRATE 5 MG/5ML IV SOLN: 5.0000 mg | Freq: Once | INTRAVENOUS | Status: DC

## 2020-11-27 MED ORDER — SODIUM CHLORIDE 3 % IN NEBU
4.0000 mL | INHALATION_SOLUTION | Freq: Every day | RESPIRATORY_TRACT | Status: DC
  Filled 2020-11-27 (×3): qty 4

## 2020-11-27 MED ORDER — LEVALBUTEROL HCL 0.63 MG/3ML IN NEBU: 0.6300 mg | INHALATION_SOLUTION | Freq: Four times a day (QID) | RESPIRATORY_TRACT | Status: DC | PRN

## 2020-11-27 MED ORDER — METOPROLOL TARTRATE 5 MG/5ML IV SOLN
INTRAVENOUS | Status: AC
  Filled 2020-11-27: qty 5

## 2020-11-27 MED ORDER — DARBEPOETIN ALFA 100 MCG/0.5ML IJ SOSY
PREFILLED_SYRINGE | INTRAMUSCULAR | Status: AC
  Administered 2020-11-27: 100 ug
  Filled 2020-11-27: qty 0.5

## 2020-11-27 MED ORDER — METOPROLOL TARTRATE 5 MG/5ML IV SOLN
5.0000 mg | Freq: Once | INTRAVENOUS | Status: AC
  Administered 2020-11-27: 5 mg via INTRAVENOUS
  Filled 2020-11-27: qty 5

## 2020-11-27 MED ORDER — SODIUM CHLORIDE 3 % IN NEBU: 4.0000 mL | INHALATION_SOLUTION | Freq: Every day | RESPIRATORY_TRACT | Status: DC

## 2020-11-27 NOTE — Progress Notes (Signed)
[facility] Progress Note     Assessment:   1. HD dep't AKI.CRRT stopped 11/03/2020.[place] placed on 10/19/20. Family desired ongoing full scope of care, Now on IHD 2. SVT 3. H/o mixed shock: hemorrhagic and septic shock; stable 4. Anemia;  on ESA 5. Perforated duodenal ulcer s/p exlap and graham patch placement 6. AHRF 2/2 ARDS s/p trach, per ccm 7. ARDS 2/2 COVID-19 Pneumonia 8. A.fib with RVR, recurrent this AM 9. Severe calorie malnutrition 10. H/o stage IV colon cancer 11. Hypercalcemia- immobility, stable  PLAN: - HD now - pt appears stable now - hospitalist is aware of A fib and transient resp issue this AM, considering addition of abx - Complex dispo planning noted.  Discussed with daughter again yesterday reiterating home dialysis isn't an option right out of hospital and the most appropriate dc would be LTACH or SNF but she is not willing to entertain those as options.      Subjective:   Seen at HD - on arrival at baseline and shortly after started developed chest pressure and A fib with RVR tx with metoprolol 2.5 IV x2.  Tachypnea, modest hypotension 90s ensued.  Not hypoxic  CXR looks if anything better than 11/13/20 CXR.  Suctioned with some outpt, PMV removed, inner cannula exchanged.  Now in sinus tachycardia with improved RR.   Objective:   BP (!) 128/94 (BP Location: Left Arm)   Pulse (!) 114   Temp 98.7 F (37.1 C) (Oral)   Resp (!) 23   Ht 5' 8"  (1.727 m)   Wt 92.6 kg   SpO2 100%   BMI 31.04 kg/m   Intake/Output Summary (Last 24 hours) at 11/27/2020 3557 Last data filed at 11/26/2020 3220 Gross per 24 hour  Intake 436 ml  Output 200 ml  Net 236 ml   Weight change:   Physical Exam: Gen: Chronically ill looking female lying on bed  HEENT: trach in place CVS: tachy, S1-S2 normal Resp: inc RR, trach; scattered ant rhonchi if anything improved; equal BS bilaterally Abd: soft, nontender Ext:  No edema appreciated Neuro: follows commands,  awake, slow to respond ACCESS: R IJ TDC  Imaging: No results found.  Labs: BMET Recent Labs  Lab 11/21/20 0217 11/22/20 0640 11/24/20 0906 11/25/20 0440 11/26/20 0916 11/27/20 0115  NA 133* 135 137 135 134* 131*  K 4.4 3.4* 3.8 4.9 4.3 4.6  CL 97* 98 99 98 95* 94*  CO2 24 22 25 24 25  20*  GLUCOSE 187* 114* 101* 133* 105* 229*  BUN 57* 61* 45* 24* 61* 103*  CREATININE 1.63* 1.59* 2.15* 1.86* 2.59* 2.78*  CALCIUM 10.1 10.5* 10.1 10.6* 10.4* 9.5  PHOS  --  7.0* 7.1*  --  6.6* 7.2*   CBC Recent Labs  Lab 11/22/20 0640 11/24/20 0906 11/25/20 0440 11/26/20 0916  WBC 9.2 12.5* 15.4* 15.2*  NEUTROABS  --   --  10.0*  --   HGB 9.5* 8.9* 10.0* 8.9*  HCT 31.6* 28.9* 33.4* 29.5*  MCV 89.0 88.7 88.4 86.8  PLT 365 397 386 410*    Medications:    . apixaban  2.5 mg Oral BID  . chlorhexidine gluconate (MEDLINE KIT)  15 mL Mouth Rinse BID  . Chlorhexidine Gluconate Cloth  6 each Topical Daily  . cholecalciferol  2,000 Units Oral Daily  . clonazepam  0.25 mg Oral QHS  . collagenase   Topical BID  . darbepoetin (ARANESP) injection - DIALYSIS  100 mcg Intravenous Q Sat-HD  .  feeding supplement (NEPRO CARB STEADY)  1,000 mL Per Tube Q24H  . feeding supplement (PROSource TF)  45 mL Per Tube TID  . fentaNYL  1 patch Transdermal Q72H  . guaiFENesin  15 mL Oral Q12H  . insulin aspart  0-5 Units Subcutaneous QHS  . insulin aspart  0-9 Units Subcutaneous TID WC  . mouth rinse  15 mL Mouth Rinse QID  . megestrol  200 mg Oral QID  . metoprolol tartrate  50 mg Oral BID  . metoprolol tartrate      . metoprolol tartrate  5 mg Intravenous Once  . midodrine  40 mg Oral TID WC  . multivitamin  1 tablet Oral QHS  . nutrition supplement (JUVEN)  1 packet Oral BID BM  . pantoprazole sodium  40 mg Oral BID  . QUEtiapine  12.5 mg Oral QHS  . sodium chloride flush  10-40 mL Intracatheter Q12H  . sodium chloride HYPERTONIC  4 mL Nebulization Daily      Justin Mend, MD 11/27/2020,  9:36 AM

## 2020-11-27 NOTE — Plan of Care (Signed)
  Problem: Education: Goal: Utilization of techniques to improve thought processes will improve Outcome: Progressing Goal: Knowledge of the prescribed therapeutic regimen will improve Outcome: Progressing   Problem: Activity: Goal: Interest or engagement in leisure activities will improve Outcome: Progressing Goal: Imbalance in normal sleep/wake cycle will improve Outcome: Progressing   Problem: Coping: Goal: Coping ability will improve Outcome: Progressing Goal: Will verbalize feelings Outcome: Progressing   Problem: Health Behavior/Discharge Planning: Goal: Ability to make decisions will improve Outcome: Progressing Goal: Compliance with therapeutic regimen will improve Outcome: Progressing   Problem: Role Relationship: Goal: Will demonstrate positive changes in social behaviors and relationships Outcome: Progressing   Problem: Safety: Goal: Ability to identify and utilize support systems that promote safety will improve Outcome: Progressing   Problem: Self-Concept: Goal: Level of anxiety will decrease Outcome: Progressing   Problem: Education: Goal: Knowledge of General Education information will improve Description: Including pain rating scale, medication(s)/side effects and non-pharmacologic comfort measures Outcome: Progressing   Problem: Health Behavior/Discharge Planning: Goal: Ability to manage health-related needs will improve Outcome: Progressing   Problem: Clinical Measurements: Goal: Ability to maintain clinical measurements within normal limits will improve Outcome: Progressing Goal: Will remain free from infection Outcome: Progressing Goal: Diagnostic test results will improve Outcome: Progressing Goal: Respiratory complications will improve Outcome: Progressing Goal: Cardiovascular complication will be avoided Outcome: Progressing   Problem: Activity: Goal: Risk for activity intolerance will decrease Outcome: Progressing   Problem:  Nutrition: Goal: Adequate nutrition will be maintained Outcome: Progressing   Problem: Coping: Goal: Level of anxiety will decrease Outcome: Progressing   Problem: Elimination: Goal: Will not experience complications related to bowel motility Outcome: Progressing Goal: Will not experience complications related to urinary retention Outcome: Progressing   Problem: Pain Managment: Goal: General experience of comfort will improve Outcome: Progressing   Problem: Safety: Goal: Ability to remain free from injury will improve Outcome: Progressing   Problem: Skin Integrity: Goal: Risk for impaired skin integrity will decrease Outcome: Progressing   Problem: Activity: Goal: Ability to tolerate increased activity will improve Outcome: Progressing   Problem: Respiratory: Goal: Ability to maintain a clear airway and adequate ventilation will improve Outcome: Progressing   Problem: Role Relationship: Goal: Method of communication will improve Outcome: Progressing   Problem: Education: Goal: Knowledge of disease and its progression will improve Outcome: Progressing   Problem: Fluid Volume: Goal: Compliance with measures to maintain balanced fluid volume will improve Outcome: Progressing   Problem: Health Behavior/Discharge Planning: Goal: Ability to manage health-related needs will improve Outcome: Progressing   Problem: Nutritional: Goal: Ability to make healthy dietary choices will improve Outcome: Progressing   Problem: Clinical Measurements: Goal: Complications related to the disease process, condition or treatment will be avoided or minimized Outcome: Progressing   Problem: Education: Goal: Knowledge of risk factors and measures for prevention of condition will improve Outcome: Progressing   Problem: Coping: Goal: Psychosocial and spiritual needs will be supported Outcome: Progressing   Problem: Respiratory: Goal: Will maintain a patent airway Outcome:  Progressing Goal: Complications related to the disease process, condition or treatment will be avoided or minimized Outcome: Progressing

## 2020-11-27 NOTE — Progress Notes (Signed)
Patient vitals stable pre-hemodialysis. Ten minutes after start of treatment patient began to complain of tightness. HR 155 Resp. 55 Jannifer Hick, MD notified and rapid response called. New orders given.

## 2020-11-27 NOTE — Progress Notes (Signed)
PROGRESS NOTE    Candace [name]  RVU:[id number] DOB: [birth year] DOA: 08/31/2020 PCP: Algis Greenhouse, MD   Brief Narrative: LLOS. 77 days in intensive care unit, most of the events are as per hospital records and previous providers documentation.  Patient does have history of colon cancer status post colon resection with colostomy bag, hyperlipidemia, type 2 diabetes, stage III chronic kidney disease.  She was admitted to the hospital and treated for COVID-19 pneumonia and was discharged back home.  Sent to the ER by her home care nurse due to elevated heart rate. 70 year old female who was recently diagnosed with COVID on 08/23/2020 came back to the ER with not feeling well, she was admitted on 08/31/2020 with A. fib with RVR, hemorrhagic shock and found to have perforated duodenal ulcer for that she underwent exploratory laparotomy on 11/3.  She had extensive ICU stay with significant events as below.  11/2 Admitted with rapid Afib 11/3 OR with findings of perforated duodenal ulcer 11/10 progressive hemorrhagic shock, intubated, transfused, pressors, proned; started on CRRT in PM 11/16 Extubated. Re-intubated overnight due to respiratory distress and hypoxia with decreased mentation 11/18 Bronch, cultures sent 11/19 Hgb down getting blood 11/24 Spiked fever, resume empirical antimicrobial therapy 11/26 Hemorrhagic shock, hgb 5.6, increased pressors,  11/30 Per palliative "[name] understanding that patient is unfortunately very fragiledespite ongoing intensive medical treatment and full mechanical support. [name] that the familywantsto continue with all current interventions despite potential outcomes". 12/08 CRRT discontinued due to clotting 12/09 Family requested transfer to tertiary care [facility]). [facility] denied transfer  12/10 CRRT restarted. Episodes of tachycardia, tachypnea that seem to improve with pain management 12/11 Back in shock. Pressor requirements up. CXR worse. ABX  resumed 12/12 Still requiring inc pressors. Had hypoglycemic event. Basal insulin dosing adjusted 12/13 Pressor requirements better. Now hyperglycemic. Re-adjusted Glycemic control  12/19 Afebrile . Remains on dilaudid and heparin gtt, dilaudid gtt increased overnight for concern of pain / ongoing tachycardia, no other events . NE and precedex off 12/17. Ongoing CRRT- even UF, dosing lokelmia this morning 12/20 On CRRT. Renal plans for HD cath and moving to HD. Getting wound care 12/24 - renal stopping CRRT today and plans HD 10/24/20 . 40% fio2 on vent via Trach. TAchypenic and tachycardic. Afebrile but wbc up to 27.6K. On TF. On heparin gtt 12/25 - Back on CRRT. On vent via trach at 40% fio2, On scheduled dilaudid as add on to oxy. Per family request 12/24 - they felt scheduled oxy was not adequate and patient was showing signs of opioid withdrawal.  Patient also had worsening SIRS/sepsis syndrome. Had fever, rising wbc, worsening RR 40-60 and HR 140s sinus->started On abx yesterday. Fever some better today. WBC plateau at 28,.5K. On new levophed ->signifanct improvement in HR 77 and RR t0 20. On heparin gtt. On precedex gtt. On levophed gtt 74mg wthi midodrine. On TF 12/30 Remains on CRRT with intermittent pressor requirements. Ethics consult placed 12/29. Ethics rec time trial of CRRT 12/31 failed SBT with RR 40s. Several conversations between care tam and family, who are upset RE plan of care 1/1 back on pressors  1/4: On pressors, keeping even on CVVHD, HGB drop to 6.9, transfused 1 unit. Improving mental status 1/10 remains on low dose levophed 234m 1/11 off levo, attempting HD with UF for first time. Now tolerating intermittent HD. 1/19 patient transferred to progressive bed with tracheostomy on 35% FiO2.  has NG tube feeding.  No PEG tube.  Receiving dialysis.  11/27/20: Rapid response called earlier at dialysis  because of persistent tachycardia, hypotension.  EKG showed sinus  tachycardia   Assessment & Plan:   Principal Problem:   Atrial fibrillation with RVR (HCC) Active Problems:   Acute respiratory failure due to COVID-19 (HCC)   New onset a-fib (HCC)   Leukocytosis   Atrial fibrillation with rapid ventricular response (HCC)   Hypoxia   Pressure injury of skin   Acute hypoxemic respiratory failure (HCC)   ARDS (adult respiratory distress syndrome) (HCC)   Perforated duodenal ulcer ([place])   On mechanically assisted ventilation ([place])   Palliative care by specialist   Goals of care, counseling/discussion   Shock ([place])   Acute and chronic respiratory failure (acute-on-chronic) ([place])   Status post tracheostomy ([place])   ESRD (end stage renal disease) ([place])   HAP (hospital-acquired pneumonia)   Paroxysmal A. fib with RVR: Hospital course remarkable for paroxysmal A. fib with RVR but had been lately on normal sinus rhythm.  She was in sinus tachycardia this morning.  Continue metoprolol.  BP soft  and is on midodrine.  Cardiology were following her here.  They recommended symptomatic treatment and beta-blocker titration as tolerated.  We will monitor her on telemetry.  We will consider PRN IV metoprolol if her blood pressure allows.  Acute respiratory secondary to Covid pneumonia: Status post tracheostomy.  Hospital course remarkable for multiple bacterial pneumonias, ventilator dependent respiratory failure.  Completed antibiotic therapy.  Currently on trach collar on 28% FiO2.  Respiratory, PCCM following.  Continue pulmonary toileting, dysphagia therapy, suctioning.  Patient was able to tolerate Passy-Muir valve. PCCM following to see if they can downsize her tracheostomy. She continues to have thick secretions from her tracheostomy.  Needs frequent suctioning.  Chest x-ray done today did not show any new pneumonia.  She has been started on cefazolin for positive tracheal culture showing MSSA.  We have to minimize nebulizing treatment because of tachycardia.   Will try hypertonic saline to loosen the secretions.  Dysphagia/poor oral intake.  Speech therapy following.  Passed barium swallow evaluation and was started on dysphagia 2 diet.  On calorie count.  Continues to have poor oral intake.  Her Dobbhoff was clogged and was removed on 1/24. plan is to put a new feeding tube if continues to have poor intake.  She might need PEG if she continues to have poor oral intake . Surgery following.  Acute metabolic encephalopathy: Continue monitor mental status.  Improved.  Able to follow command.  Be judicious on  sedatives.  On Seroquel  AKI: Nephrology following.  On intermittent dialysis.  On midodrine for blood pressure support.  Family interested in dialysis at home but not feasible as per nephrology.  Current recommendation is for skilled nursing facility/LTAC level of care.  Social worker, nephrology closely following for this.  Pneumoperitoneum/perforated duodenal ulcer:status post exploratory laparotomy, adhesiolysis, modified Graham patch, repair of perforated ulcer on 11/3 by Dr. Bobbye Morton week.  Tolerating oral diet.  Surgery recommended twice daily wet-to-dry dressing of midline abdominal wound  Hemorrhagic shock: Resolved.  Blood pressure currently stable.  DVT of lower extremity: On Eliquis for anticoagulation.  Anemia of critical illness: Received multiple transfusions during this hospitalization.  Total of 12 units of PRBC.  Currently hemoglobin stable.  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  Also on Seroquel, Klonopin PT/OT recommended skilled nursing facility placement but is very challenging due to need of dialysis, concurrent tracheostomy,  pressure ulcer.  Possible LTAC.  As per the daughter, she is  interested on taking her mom to home with home health.  Type 2 diabetes mellitus with hyperglycemia: Episodes of hypoglycemia after extremity feeds.   Continue sliding scale insulin.  Monitor blood sugars  Sacral  decubitus ulcer: Not present on admission.  Has extensive sacral decubitus ulcer.  Wound care following.  Also seen by general surgery.  Recommended against anesthesia/surgical procedure.  Continue wound care.  Currently receiving hydrotherapy  History of stage IV colon cancer: Has colostomy.  Outpatient follow-up recommended           Nutrition Problem: Increased nutrient needs Etiology: wound healing      DVT prophylaxis:Eliquis Code Status: Full Family Communication: None at bedside Status is: Inpatient  Remains inpatient appropriate because:Unsafe d/c plan   Dispo: The patient is from: Home              Anticipated d/c is to: LTAC vs Home with home health              Anticipated d/c date is: > 3 days              Patient currently is not medically stable to d/c.   Difficult to place patient Yes      Consultants: Cardiology, surgery, nephrology, ethics, PCCM  Procedures:R PICC 11/5 >> A line 11/9 >> out ETT 11/9 > 11/16, 11/16 >> 09/21/2020, 09/21/2020 tracheostomy>> Lt [place] CVL 11/9 >> R IJ trialysis >> out HD catheter 12/1 >>12/20 12/21 - 14.5 Fr, 23 cm right IJ tunneled hemodialysis catheter placement. Removal of indwelling subclavian catheter.   Antimicrobials:  Anti-infectives (From admission, onward)   Start     Dose/Rate Route Frequency Ordered Stop   11/27/20 2000  ceFAZolin (ANCEF) IVPB 1 g/50 mL premix        1 g 100 mL/hr over 30 Minutes Intravenous Every 24 hours 11/26/20 1248     11/26/20 1345  ceFAZolin (ANCEF) IVPB 1 g/50 mL premix        1 g 100 mL/hr over 30 Minutes Intravenous  Once 11/26/20 1248 11/26/20 1433   11/17/20 1415  fluconazole (DIFLUCAN) 40 MG/ML suspension 200 mg        200 mg Oral  Once 11/17/20 1315 11/17/20 1357   11/03/20 2200  meropenem (MERREM) 500 mg in sodium chloride 0.9 % 100 mL IVPB        500 mg 200 mL/hr over 30 Minutes Intravenous Every 24 hours  11/03/20 1500 11/06/20 2217   11/01/20 1000  anidulafungin (ERAXIS) 100 mg in sodium chloride 0.9 % 100 mL IVPB  Status:  Discontinued       "Followed by" Linked Group Details   100 mg 78 mL/hr over 100 Minutes Intravenous Every 24 hours 10/31/20 0916 11/01/20 1404   10/31/20 1015  meropenem (MERREM) 1 g in sodium chloride 0.9 % 100 mL IVPB  Status:  Discontinued        1 g 200 mL/hr over 30 Minutes Intravenous Every 8 hours 10/31/20 0916 11/03/20 1500   10/31/20 1015  linezolid (ZYVOX) IVPB 600 mg  Status:  Discontinued        600 mg 300 mL/hr over 60 Minutes Intravenous Every 12 hours 10/31/20 0916 11/02/20 0906   10/31/20 1015  anidulafungin (ERAXIS) 200 mg in sodium chloride 0.9 % 200 mL IVPB       "Followed by" Linked Group Details   200 mg 78 mL/hr over 200 Minutes  Intravenous  Once 10/31/20 0916 10/31/20 1630   10/25/20 1800  ceFAZolin (ANCEF) IVPB 2g/100 mL premix  Status:  Discontinued        2 g 200 mL/hr over 30 Minutes Intravenous Every 12 hours 10/25/20 1022 10/30/20 1103   10/23/20 2000  vancomycin (VANCOREADY) IVPB 750 mg/150 mL  Status:  Discontinued        750 mg 150 mL/hr over 60 Minutes Intravenous Every 24 hours 10/22/20 2036 10/25/20 1022   10/23/20 1800  piperacillin-tazobactam (ZOSYN) IVPB 3.375 g  Status:  Discontinued        3.375 g 100 mL/hr over 30 Minutes Intravenous Every 6 hours 10/23/20 1155 10/24/20 1422   10/23/20 0200  piperacillin-tazobactam (ZOSYN) IVPB 3.375 g  Status:  Discontinued        3.375 g 100 mL/hr over 30 Minutes Intravenous Every 8 hours 10/22/20 2036 10/23/20 1155   10/22/20 1630  vancomycin (VANCOREADY) IVPB 1500 mg/300 mL        1,500 mg 150 mL/hr over 120 Minutes Intravenous  Once 10/22/20 1537 10/22/20 2229   10/22/20 1630  piperacillin-tazobactam (ZOSYN) IVPB 2.25 g  Status:  Discontinued        2.25 g 100 mL/hr over 30 Minutes Intravenous Every 8 hours 10/22/20 1537 10/22/20 2036   10/22/20 1537  vancomycin variable dose per  unstable renal function (pharmacist dosing)  Status:  Discontinued         Does not apply See admin instructions 10/22/20 1537 10/22/20 2036   10/19/20 0600  ceFAZolin (ANCEF) IVPB 2g/100 mL premix        2 g 200 mL/hr over 30 Minutes Intravenous To Radiology 10/18/20 1457 10/19/20 0938   10/10/20 0830  vancomycin (VANCOCIN) IVPB 1000 mg/200 mL premix       "Followed by" Linked Group Details   1,000 mg 200 mL/hr over 60 Minutes Intravenous Every 24 hours 10/09/20 0744 10/15/20 0902   10/09/20 0830  piperacillin-tazobactam (ZOSYN) IVPB 3.375 g        3.375 g 100 mL/hr over 30 Minutes Intravenous Every 6 hours 10/09/20 0744 10/16/20 0038   10/09/20 0830  vancomycin (VANCOREADY) IVPB 2000 mg/400 mL       "Followed by" Linked Group Details   2,000 mg 200 mL/hr over 120 Minutes Intravenous  Once 10/09/20 0744 10/09/20 1022   09/24/20 1000  ceFAZolin (ANCEF) IVPB 2g/100 mL premix  Status:  Discontinued        2 g 200 mL/hr over 30 Minutes Intravenous Every 12 hours 09/24/20 0801 09/24/20 1046   09/23/20 1400  vancomycin (VANCOCIN) IVPB 1000 mg/200 mL premix        1,000 mg 200 mL/hr over 60 Minutes Intravenous Every 24 hours 09/22/20 1436 09/28/20 1718   09/22/20 2200  ceFEPIme (MAXIPIME) 2 g in sodium chloride 0.9 % 100 mL IVPB  Status:  Discontinued        2 g 200 mL/hr over 30 Minutes Intravenous Every 12 hours 09/22/20 1436 09/24/20 0801   09/22/20 1030  ceFEPIme (MAXIPIME) 1 g in sodium chloride 0.9 % 100 mL IVPB        1 g 200 mL/hr over 30 Minutes Intravenous  Once 09/22/20 0934 09/22/20 1145   09/22/20 1015  vancomycin (VANCOCIN) IVPB 1000 mg/200 mL premix        1,000 mg 200 mL/hr over 60 Minutes Intravenous  Once 09/22/20 0934 09/22/20 1446   09/12/20 2200  ceFEPIme (MAXIPIME) 2 g in sodium chloride 0.9 % 100  mL IVPB        2 g 200 mL/hr over 30 Minutes Intravenous Every 12 hours 09/12/20 0732 09/14/20 2134   09/11/20 1400  ceFEPIme (MAXIPIME) 2 g in sodium chloride 0.9 % 100 mL  IVPB  Status:  Discontinued        2 g 200 mL/hr over 30 Minutes Intravenous Every 8 hours 09/11/20 1126 09/12/20 0732   09/08/20 1000  vancomycin (VANCOREADY) IVPB 2000 mg/400 mL        2,000 mg 200 mL/hr over 120 Minutes Intravenous  Once 09/08/20 0857 09/08/20 1224   09/08/20 1000  ceFEPIme (MAXIPIME) 2 g in sodium chloride 0.9 % 100 mL IVPB  Status:  Discontinued        2 g 200 mL/hr over 30 Minutes Intravenous Every 12 hours 09/08/20 0857 09/11/20 1126   09/08/20 0856  vancomycin variable dose per unstable renal function (pharmacist dosing)  Status:  Discontinued         Does not apply See admin instructions 09/08/20 0857 09/09/20 0935   09/02/20 1600  cefTRIAXone (ROCEPHIN) 1 g in sodium chloride 0.9 % 100 mL IVPB  Status:  Discontinued        1 g 200 mL/hr over 30 Minutes Intravenous Every 24 hours 09/01/20 1811 09/02/20 0838   09/01/20 1800  fluconazole (DIFLUCAN) IVPB 400 mg        400 mg 50 mL/hr over 240 Minutes Intravenous  Once 09/01/20 1749 09/02/20 0603   09/01/20 1530  piperacillin-tazobactam (ZOSYN) IVPB 3.375 g        3.375 g 12.5 mL/hr over 240 Minutes Intravenous Every 8 hours 09/01/20 1514 09/05/20 2111   09/01/20 1000  levofloxacin (LEVAQUIN) tablet 250 mg  Status:  Discontinued        250 mg Oral Daily 08/31/20 1508 08/31/20 1735   08/31/20 1730  cefTRIAXone (ROCEPHIN) 1 g in sodium chloride 0.9 % 100 mL IVPB  Status:  Discontinued        1 g 200 mL/hr over 30 Minutes Intravenous Every 24 hours 08/31/20 1726 09/01/20 1513   08/31/20 1730  azithromycin (ZITHROMAX) 500 mg in sodium chloride 0.9 % 250 mL IVPB  Status:  Discontinued        500 mg 250 mL/hr over 60 Minutes Intravenous Every 24 hours 08/31/20 1726 09/02/20 0838      Subjective: Patient seen and examined the bedside at dialysis suite.  She was little tachypneic, thick secretions were being aspirated at the bedside from tracheostomy.  Heart rate in the range of 115-125.EKG showed sinus  tachycardia  Objective: Vitals:   11/27/20 1000 11/27/20 1030 11/27/20 1100 11/27/20 1120  BP: 95/67 107/73 123/70 107/76  Pulse: (!) 122 (!) 127 (!) 130 (!) 132  Resp: (!) 38 (!) 27 (!) 28 (!) 25  Temp:      TempSrc:      SpO2: 100% 100% 100% 100%  Weight:      Height:        Intake/Output Summary (Last 24 hours) at 11/27/2020 1231 Last data filed at 11/26/2020 1852 Gross per 24 hour  Intake 50 ml  Output 200 ml  Net -150 ml   Filed Weights   11/16/20 1500 11/16/20 1802 11/16/20 2120  Weight: 86 kg 85 kg 92.6 kg    Examination:   General exam: Severely deconditioned, debilitated, lying on the bed HEENT: Tracheostomy with thick secretions Respiratory system: No frank wheezes or crackles  Cardiovascular system: Sinus tachycardia, no JVD, murmurs, rubs,  gallops or clicks. Gastrointestinal system: Abdomen is nondistended, soft and nontender. No organomegaly or masses felt. Normal bowel sounds heard.  Colostomy Central nervous system: Alert and awake. Extremities: No edema, no clubbing ,no cyanosis Skin: Stage IV sacral decubitus ulcer    Data Reviewed: I have personally reviewed following labs and imaging studies  CBC: Recent Labs  Lab 11/22/20 0640 11/24/20 0906 11/25/20 0440 11/26/20 0916  WBC 9.2 12.5* 15.4* 15.2*  NEUTROABS  --   --  10.0*  --   HGB 9.5* 8.9* 10.0* 8.9*  HCT 31.6* 28.9* 33.4* 29.5*  MCV 89.0 88.7 88.4 86.8  PLT 365 397 386 256*   Basic Metabolic Panel: Recent Labs  Lab 11/22/20 0640 11/24/20 0906 11/25/20 0440 11/26/20 0916 11/27/20 0115  NA 135 137 135 134* 131*  K 3.4* 3.8 4.9 4.3 4.6  CL 98 99 98 95* 94*  CO2 22 25 24 25  20*  GLUCOSE 114* 101* 133* 105* 229*  BUN 61* 45* 24* 61* 103*  CREATININE 1.59* 2.15* 1.86* 2.59* 2.78*  CALCIUM 10.5* 10.1 10.6* 10.4* 9.5  PHOS 7.0* 7.1*  --  6.6* 7.2*   GFR: Estimated Creatinine Clearance: 22.7 mL/min (A) (by C-G formula based on SCr of 2.78 mg/dL (H)). Liver Function Tests: Recent  Labs  Lab 11/22/20 0640 11/24/20 0906 11/26/20 0916 11/27/20 0115  ALBUMIN 2.5* 2.5* 2.6* 2.6*   No results for input(s): LIPASE, AMYLASE in the last 168 hours. No results for input(s): AMMONIA in the last 168 hours. Coagulation Profile: No results for input(s): INR, PROTIME in the last 168 hours. Cardiac Enzymes: No results for input(s): CKTOTAL, CKMB, CKMBINDEX, TROPONINI in the last 168 hours. BNP (last 3 results) No results for input(s): PROBNP in the last 8760 hours. HbA1C: No results for input(s): HGBA1C in the last 72 hours. CBG: Recent Labs  Lab 11/25/20 2138 11/26/20 0757 11/26/20 1131 11/26/20 1556 11/26/20 2112  GLUCAP 109* 103* 134* 142* 276*   Lipid Profile: No results for input(s): CHOL, HDL, LDLCALC, TRIG, CHOLHDL, LDLDIRECT in the last 72 hours. Thyroid Function Tests: No results for input(s): TSH, T4TOTAL, FREET4, T3FREE, THYROIDAB in the last 72 hours. Anemia Panel: No results for input(s): VITAMINB12, FOLATE, FERRITIN, TIBC, IRON, RETICCTPCT in the last 72 hours. Sepsis Labs: No results for input(s): PROCALCITON, LATICACIDVEN in the last 168 hours.  Recent Results (from the past 240 hour(s))  Culture, respiratory (non-expectorated)     Status: None   Collection Time: 11/22/20  4:45 PM   Specimen: Tracheal Aspirate; Respiratory  Result Value Ref Range Status   Specimen Description TRACHEAL ASPIRATE  Final   Special Requests NONE  Final   Gram Stain   Final    ABUNDANT WBC PRESENT,BOTH PMN AND MONONUCLEAR ABUNDANT GRAM POSITIVE COCCI IN PAIRS Performed at [facility], [place] [address] [place], [place] [address]    Culture MODERATE STAPHYLOCOCCUS AUREUS  Final   Report Status 11/25/2020 FINAL  Final   Organism ID, Bacteria STAPHYLOCOCCUS AUREUS  Final      Susceptibility   Staphylococcus aureus - MIC*    CIPROFLOXACIN <=0.5 SENSITIVE Sensitive     ERYTHROMYCIN <=0.25 SENSITIVE Sensitive     GENTAMICIN <=0.5 SENSITIVE Sensitive      OXACILLIN <=0.25 SENSITIVE Sensitive     TETRACYCLINE <=1 SENSITIVE Sensitive     VANCOMYCIN <=0.5 SENSITIVE Sensitive     TRIMETH/SULFA <=10 SENSITIVE Sensitive     CLINDAMYCIN <=0.25 SENSITIVE Sensitive     RIFAMPIN <=0.5 SENSITIVE Sensitive  Inducible Clindamycin NEGATIVE Sensitive     * MODERATE STAPHYLOCOCCUS AUREUS         Radiology Studies: DG Chest 1 View  Result Date: 11/27/2020 CLINICAL DATA:  Atrial fibrillation. EXAM: CHEST  1 VIEW COMPARISON:  11/25/2020 FINDINGS: Tracheostomy tube in satisfactory position. Right jugular catheter tip in the superior vena cava. Feeding tube extending into the stomach. Normal sized heart. No significant change in bibasilar airspace opacity and linear densities. Unremarkable bones. IMPRESSION: No significant change in bibasilar atelectasis and pneumonia/scarring. Electronically Signed   By: Claudie Revering M.D.   On: 11/27/2020 10:42        Scheduled Meds: . apixaban  2.5 mg Oral BID  . chlorhexidine gluconate (MEDLINE KIT)  15 mL Mouth Rinse BID  . Chlorhexidine Gluconate Cloth  6 each Topical Daily  . cholecalciferol  2,000 Units Oral Daily  . clonazepam  0.25 mg Oral QHS  . collagenase   Topical BID  . darbepoetin (ARANESP) injection - DIALYSIS  100 mcg Intravenous Q Sat-HD  . feeding supplement (NEPRO CARB STEADY)  1,000 mL Per Tube Q24H  . feeding supplement (PROSource TF)  45 mL Per Tube TID  . fentaNYL  1 patch Transdermal Q72H  . guaiFENesin  15 mL Oral Q12H  . insulin aspart  0-5 Units Subcutaneous QHS  . insulin aspart  0-9 Units Subcutaneous TID WC  . mouth rinse  15 mL Mouth Rinse QID  . megestrol  200 mg Oral QID  . metoprolol tartrate  50 mg Oral BID  . metoprolol tartrate      . metoprolol tartrate  5 mg Intravenous Once  . midodrine  40 mg Oral TID WC  . multivitamin  1 tablet Oral QHS  . nutrition supplement (JUVEN)  1 packet Oral BID BM  . pantoprazole sodium  40 mg Oral BID  . QUEtiapine  12.5 mg Oral QHS   . sodium chloride flush  10-40 mL Intracatheter Q12H  . sodium chloride HYPERTONIC  4 mL Nebulization Daily   Continuous Infusions: . sodium chloride 5 mL/hr at 11/16/20 0935  . sodium chloride    . albumin human 25 g (11/16/20 1516)  .  ceFAZolin (ANCEF) IV       LOS: 87 days    Time spent:35 mins.More than 50% of that time was spent in counseling and/or coordination of care.      Shelly Coss, MD [facility] P1/29/2022, 12:31 PM

## 2020-11-27 NOTE — Progress Notes (Signed)
Candace Fairly, NP, rapid response nurse and Jannifer Hick, MD at bedside. Metoprolol 2.5 mg X 2 given IV. EKG 12-lead and chest x-ray done at beside. Will continue to monitor.

## 2020-11-27 NOTE — Significant Event (Signed)
Rapid Response Event Note   Reason for Call :  Tachycardia and tachypnea   Initial Focused Assessment:  Called by the HD nurse because her patient had just gone into possible afib rvr or svt.  The patient also stated that her chest felt tight.  Patient was also tachypnic on 5L trach collar.  The patient denies shortness of breath.  Lung sound course but she does not sound like she has flash pulmonary edema.  HD session was just started on this patient when she began showing symptoms.  MD and NP at bedside to assess patient.    BP 108/71 ECG 120 RR 26 O2 100 % trach collar   Interventions:  Chest x-ray, suction set up, patient suctioned x2, inner trach cannula changed, passy muir valve taken off, trach cuff re-inflated, vitals taken  Plan of Care:  Patient will continue to receive HD treatment cautiously.  BP will be taken q5 minutes.  Rapid number given to HD RN.  Event Summary:   MD Notified:  Call Time: Arrival Time: End Time:  Venetia Maxon, RN

## 2020-11-28 DIAGNOSIS — I4891 Unspecified atrial fibrillation: Secondary | ICD-10-CM | POA: Diagnosis not present

## 2020-11-28 LAB — GLUCOSE, CAPILLARY
Glucose-Capillary: 168 mg/dL — ABNORMAL HIGH (ref 70–99)
Glucose-Capillary: 171 mg/dL — ABNORMAL HIGH (ref 70–99)
Glucose-Capillary: 174 mg/dL — ABNORMAL HIGH (ref 70–99)
Glucose-Capillary: 226 mg/dL — ABNORMAL HIGH (ref 70–99)

## 2020-11-28 LAB — BASIC METABOLIC PANEL
Anion gap: 13 (ref 5–15)
BUN: 47 mg/dL — ABNORMAL HIGH (ref 8–23)
CO2: 24 mmol/L (ref 22–32)
Calcium: 9.4 mg/dL (ref 8.9–10.3)
Chloride: 95 mmol/L — ABNORMAL LOW (ref 98–111)
Creatinine, Ser: 1.56 mg/dL — ABNORMAL HIGH (ref 0.44–1.00)
GFR, Estimated: 36 mL/min — ABNORMAL LOW (ref >60)
Glucose, Bld: 224 mg/dL — ABNORMAL HIGH (ref 70–99)
Potassium: 3.9 mmol/L (ref 3.5–5.1)
Sodium: 132 mmol/L — ABNORMAL LOW (ref 135–145)

## 2020-11-28 NOTE — Plan of Care (Signed)

## 2020-11-28 NOTE — Progress Notes (Signed)
  HOSPITAL MEDICINE OVERNIGHT EVENT NOTE     Notified by nursing the patient is exhibiting a yellow MEWS score.  Chart reviewed, patient has experienced a protracted hospitalization.  Patient has been managed for afib with RvR, perforated duodenal ulcer, hemorrhagic shock, respiratory failure with prolonged now S/P tracheostomy.    Per my discussion with nursing, patient has exhibited no significant change in symptoms as of late.  Patient is exhibiting no evidence of respiratory distress or confusion.  Patient exhibits no evidence of fever.  Patient is hemodynamically stable.  Patient currently has no complaints.  Continue current plan of care including current regimen of antibiotics.  Continuing to monitor patient closely.   Vernelle Emerald  MD [facility]

## 2020-11-28 NOTE — Progress Notes (Signed)
Fed patient all of pureed fruit, and half of orange magic cup per request. Patient able to drink entire thickened ginger ale. No signs of aspiration

## 2020-11-28 NOTE — Progress Notes (Signed)
[facility] Progress Note     Assessment:   1. HD dep't AKI.CRRT stopped 11/03/2020.[place] placed on 10/19/20. Family desired ongoing full scope of care, Now on IHD 2. SVT 3. H/o mixed shock: hemorrhagic and septic shock; stable 4. Anemia;  on ESA 5. Perforated duodenal ulcer s/p exlap and graham patch placement 6. AHRF 2/2 ARDS s/p trach, per ccm 7. ARDS 2/2 COVID-19 Pneumonia 8. A.fib with RVR, recurrent this AM 9. Severe calorie malnutrition 10. H/o stage IV colon cancer 11. Hypercalcemia- immobility, stable  PLAN: - HD plan next Tues - Was net even with HD yesterday - should be able to tolerate with next treatment -TDC cuff exposed, exit site looks normal; IR consult placed for non urgent exchange.  Discussed with floor RN that the catheter is not as secure so caution when moving her.  - Complex dispo planning noted.  Discussed with daughter that home dialysis isn't an option right out of hospital and the most appropriate dc would be LTACH or SNF but she is not willing to entertain those as options. She is not opposed to outpt dialysis clinic, just [facility] or SNF.    Subjective:   Stable after dialysis yesterday where she had some A fib RVR, sinus tach, hypotension - ? Mucus plugging.  No new issues.    Objective:   BP 100/68 (BP Location: Left Arm)   Pulse (!) 108   Temp 99.2 F (37.3 C) (Axillary)   Resp 20   Ht _0  (1.727 m)   Wt 92.6 kg   SpO2 97%   BMI 31.04 kg/m   Intake/Output Summary (Last 24 hours) at 11/28/2020 0849 Last data filed at 11/28/2020 0600 Gross per 24 hour  Intake 1788.65 ml  Output 1100 ml  Net 688.65 ml   Weight change:   Physical Exam: Gen: Chronically ill looking female lying on bed  HEENT: trach in place CVS: tachy, S1-S2 normal Resp: inc RR, trach; scattered ant rhonchi, no rales Abd: soft, nontender Ext:  No edema appreciated Neuro: follows commands, awake, slow to respond ACCESS: R IJ TDC cuff  exposed  Imaging: DG Chest 1 View  Result Date: 11/27/2020 CLINICAL DATA:  Atrial fibrillation. EXAM: CHEST  1 VIEW COMPARISON:  11/25/2020 FINDINGS: Tracheostomy tube in satisfactory position. Right jugular catheter tip in the superior vena cava. Feeding tube extending into the stomach. Normal sized heart. No significant change in bibasilar airspace opacity and linear densities. Unremarkable bones. IMPRESSION: No significant change in bibasilar atelectasis and pneumonia/scarring. Electronically Signed   By: Claudie Revering M.D.   On: 11/27/2020 10:42    Labs: BMET Recent Labs  Lab 11/22/20 0640 11/24/20 0906 11/25/20 0440 11/26/20 0916 11/27/20 0115 11/28/20 0044  NA 135 137 135 134* 131* 132*  K 3.4* 3.8 4.9 4.3 4.6 3.9  CL 98 99 98 95* 94* 95*  CO2 _1 20* 24  GLUCOSE 114* 101* 133* 105* 229* 224*  BUN 61* 45* 24* 61* 103* 47*  CREATININE 1.59* 2.15* 1.86* 2.59* 2.78* 1.56*  CALCIUM 10.5* 10.1 10.6* 10.4* 9.5 9.4  PHOS 7.0* 7.1*  --  6.6* 7.2*  --    CBC Recent Labs  Lab 11/22/20 0640 11/24/20 0906 11/25/20 0440 11/26/20 0916  WBC 9.2 12.5* 15.4* 15.2*  NEUTROABS  --   --  10.0*  --   HGB 9.5* 8.9* 10.0* 8.9*  HCT 31.6* 28.9* 33.4* 29.5*  MCV 89.0 88.7 88.4 86.8  PLT 365 397 386 410*  Medications:    . apixaban  2.5 mg Oral BID  . chlorhexidine gluconate (MEDLINE KIT)  15 mL Mouth Rinse BID  . Chlorhexidine Gluconate Cloth  6 each Topical Daily  . cholecalciferol  2,000 Units Oral Daily  . clonazepam  0.25 mg Oral QHS  . collagenase   Topical BID  . darbepoetin (ARANESP) injection - DIALYSIS  100 mcg Intravenous Q Sat-HD  . feeding supplement (NEPRO CARB STEADY)  1,000 mL Per Tube Q24H  . feeding supplement (PROSource TF)  45 mL Per Tube TID  . fentaNYL  1 patch Transdermal Q72H  . guaiFENesin  15 mL Oral Q12H  . insulin aspart  0-5 Units Subcutaneous QHS  . insulin aspart  0-9 Units Subcutaneous TID WC  . mouth rinse  15 mL Mouth Rinse QID  .  megestrol  200 mg Oral QID  . metoprolol tartrate  50 mg Oral BID  . midodrine  40 mg Oral TID WC  . multivitamin  1 tablet Oral QHS  . nutrition supplement (JUVEN)  1 packet Oral BID BM  . pantoprazole sodium  40 mg Oral BID  . sodium chloride flush  10-40 mL Intracatheter Q12H  . sodium chloride HYPERTONIC  4 mL Nebulization Daily      Justin Mend, MD 11/28/2020, 8:49 AM

## 2020-11-28 NOTE — Progress Notes (Signed)
PROGRESS NOTE    Candace [name]  MMH:[id number] DOB: 09-Nov-1950 DOA: 08/31/2020 PCP: Algis Greenhouse, MD   Brief Narrative: LLOS. 77 days in intensive care unit, most of the events are as per hospital records and previous providers documentation.  Patient does have history of colon cancer status post colon resection with colostomy bag, hyperlipidemia, type 2 diabetes, stage III chronic kidney disease.  She was admitted to the hospital and treated for COVID-19 pneumonia and was discharged back home.  Sent to the ER by her home care nurse due to elevated heart rate. 70 year old female who was recently diagnosed with COVID on 08/23/2020 came back to the ER with not feeling well, she was admitted on 08/31/2020 with A. fib with RVR, hemorrhagic shock and found to have perforated duodenal ulcer for that she underwent exploratory laparotomy on 11/3.  She had extensive ICU stay with significant events as below.  11/2 Admitted with rapid Afib 11/3 OR with findings of perforated duodenal ulcer 11/10 progressive hemorrhagic shock, intubated, transfused, pressors, proned; started on CRRT in PM 11/16 Extubated. Re-intubated overnight due to respiratory distress and hypoxia with decreased mentation 11/18 Bronch, cultures sent 11/19 Hgb down getting blood 11/24 Spiked fever, resume empirical antimicrobial therapy 11/26 Hemorrhagic shock, hgb 5.6, increased pressors,  11/30 Per palliative "[name] understanding that patient is unfortunately very fragiledespite ongoing intensive medical treatment and full mechanical support. [name] that the familywantsto continue with all current interventions despite potential outcomes". 12/08 CRRT discontinued due to clotting 12/09 Family requested transfer to tertiary care [facility]). [facility] denied transfer  12/10 CRRT restarted. Episodes of tachycardia, tachypnea that seem to improve with pain management 12/11 Back in shock. Pressor requirements up. CXR worse. ABX  resumed 12/12 Still requiring inc pressors. Had hypoglycemic event. Basal insulin dosing adjusted 12/13 Pressor requirements better. Now hyperglycemic. Re-adjusted Glycemic control  12/19 Afebrile . Remains on dilaudid and heparin gtt, dilaudid gtt increased overnight for concern of pain / ongoing tachycardia, no other events . NE and precedex off 12/17. Ongoing CRRT- even UF, dosing lokelmia this morning 12/20 On CRRT. Renal plans for HD cath and moving to HD. Getting wound care 12/24 - renal stopping CRRT today and plans HD 10/24/20 . 40% fio2 on vent via Trach. TAchypenic and tachycardic. Afebrile but wbc up to 27.6K. On TF. On heparin gtt 12/25 - Back on CRRT. On vent via trach at 40% fio2, On scheduled dilaudid as add on to oxy. Per family request 12/24 - they felt scheduled oxy was not adequate and patient was showing signs of opioid withdrawal.  Patient also had worsening SIRS/sepsis syndrome. Had fever, rising wbc, worsening RR 40-60 and HR 140s sinus->started On abx yesterday. Fever some better today. WBC plateau at 28,.5K. On new levophed ->signifanct improvement in HR 77 and RR t0 20. On heparin gtt. On precedex gtt. On levophed gtt 26mg wthi midodrine. On TF 12/30 Remains on CRRT with intermittent pressor requirements. Ethics consult placed 12/29. Ethics rec time trial of CRRT 12/31 failed SBT with RR 40s. Several conversations between care tam and family, who are upset RE plan of care 1/1 back on pressors  1/4: On pressors, keeping even on CVVHD, HGB drop to 6.9, transfused 1 unit. Improving mental status 1/10 remains on low dose levophed 255m 1/11 off levo, attempting HD with UF for first time. Now tolerating intermittent HD. 1/19 patient transferred to progressive bed with tracheostomy on 35% FiO2.  has NG tube feeding.  No PEG tube.  Receiving dialysis.  11/27/20: Rapid response called earlier at dialysis  because of persistent tachycardia, hypotension.  EKG showed sinus  tachycardia  11/28/20: Patient looks more comfortable today.  She was still in sinus tachycardia, heart rate ranging between 100-120. BP good. Still having thick secretions from tracheostomy site.  Overall looks comfortable.  Being fed at the bedside   Assessment & Plan:   Principal Problem:   Atrial fibrillation with RVR ([place]) Active Problems:   Acute respiratory failure due to COVID-19 (HCC)   New onset a-fib (HCC)   Leukocytosis   Atrial fibrillation with rapid ventricular response (HCC)   Hypoxia   Pressure injury of skin   Acute hypoxemic respiratory failure (HCC)   ARDS (adult respiratory distress syndrome) (HCC)   Perforated duodenal ulcer ([place])   On mechanically assisted ventilation ([place])   Palliative care by specialist   Goals of care, counseling/discussion   Shock ([place])   Acute and chronic respiratory failure (acute-on-chronic) ([place])   Status post tracheostomy ([place])   ESRD (end stage renal disease) ([place])   HAP (hospital-acquired pneumonia)   Paroxysmal A. fib with RVR: Hospital course remarkable for paroxysmal A. fib with RVR but had been lately on normal sinus rhythm.  She was in sinus tachycardia this morning.  Continue metoprolol.  BP soft  and is on midodrine.  Cardiology were following her here.  They recommended symptomatic treatment and beta-blocker titration as tolerated.  We will monitor her on telemetry.  On PRN IV metoprolol if her blood pressure allows.  Acute respiratory secondary to Covid pneumonia: Status post tracheostomy.  Hospital course remarkable for multiple bacterial pneumonias, ventilator dependent respiratory failure.  Completed antibiotic therapy.  Currently on trach collar on 28% FiO2.  Respiratory, PCCM following.  Continue pulmonary toileting, dysphagia therapy, suctioning.  Patient was able to tolerate Passy-Muir valve. PCCM following to see if they can downsize her tracheostomy. She continues to have thick secretions from her tracheostomy.   Needs frequent suctioning.  Chest x-ray done on 11/27/29  did not show any new pneumonia.  She has been started on cefazolin for positive tracheal culture showing MSSA.  We have to minimize nebulizing treatment because of tachycardia.  Ordered hypertonic saline to loosen the secretions.  Dysphagia/poor oral intake.  Speech therapy following.  Passed barium swallow evaluation and was started on dysphagia 2 diet. Continues to have poor oral intake.  Her Dobbhoff was clogged and was removed on 1/24. She might need PEG if she continues to have poor oral intake . Surgery following.  Acute metabolic encephalopathy: Continue monitor mental status.  Improved.  Able to follow command.  Be judicious on  sedatives.  AKI: Nephrology following.  On intermittent dialysis.  On midodrine for blood pressure support.  Family interested in dialysis at home but not feasible as per nephrology.  Current recommendation is for skilled nursing facility/LTAC level of care.  Social worker, nephrology closely following for this.  Pneumoperitoneum/perforated duodenal ulcer:status post exploratory laparotomy, adhesiolysis, modified Graham patch, repair of perforated ulcer on 11/3 by Dr. Bobbye Morton week.  Tolerating oral diet.  Surgery recommended twice daily wet-to-dry dressing of midline abdominal wound  Hemorrhagic shock: Resolved.  Blood pressure currently stable.  DVT of lower extremity: On Eliquis for anticoagulation.  Anemia of critical illness: Received multiple transfusions during this hospitalization.  Total of 12 units of PRBC.  Currently hemoglobin stable.  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  Also on Seroquel, Klonopin PT/OT recommended skilled  nursing facility placement but is very challenging due to need of dialysis, concurrent tracheostomy, pressure ulcer.  Possible LTAC.  As per the daughter, she is  interested on taking  her mom to home with home health.  Type 2 diabetes mellitus with hyperglycemia: Episodes of hypoglycemia after extremity feeds.  Continue sliding scale insulin.  Monitor blood sugars  Sacral  decubitus ulcer: Not present on admission.  Has extensive sacral decubitus ulcer.  Wound care following.  Also seen by general surgery.  Recommended against anesthesia/surgical procedure.  Continue wound care.  Currently receiving hydrotherapy  History of stage IV colon cancer: Has colostomy.  Outpatient follow-up recommended           Nutrition Problem: Increased nutrient needs Etiology: wound healing      DVT prophylaxis:Eliquis Code Status: Full Family Communication: None at bedside Status is: Inpatient  Remains inpatient appropriate because:Unsafe d/c plan   Dispo: The patient is from: Home              Anticipated d/c is to: LTAC vs Home with home health              Anticipated d/c date is: > 3 days              Patient currently is not medically stable to d/c.   Difficult to place patient Yes      Consultants: Cardiology, surgery, nephrology, ethics, PCCM  Procedures:R PICC 11/5 >> A line 11/9 >> out ETT 11/9 > 11/16, 11/16 >> 09/21/2020, 09/21/2020 tracheostomy>> Lt [place] CVL 11/9 >> R IJ trialysis >> out HD catheter 12/1 >>12/20 12/21 - 14.5 Fr, 23 cm right IJ tunneled hemodialysis catheter placement. Removal of indwelling subclavian catheter.   Antimicrobials:  Anti-infectives (From admission, onward)   Start     Dose/Rate Route Frequency Ordered Stop   11/27/20 2000  ceFAZolin (ANCEF) IVPB 1 g/50 mL premix        1 g 100 mL/hr over 30 Minutes Intravenous Every 24 hours 11/26/20 1248     11/26/20 1345  ceFAZolin (ANCEF) IVPB 1 g/50 mL premix        1 g 100 mL/hr over 30 Minutes Intravenous  Once 11/26/20 1248 11/26/20 1433   11/17/20 1415  fluconazole (DIFLUCAN) 40 MG/ML suspension 200 mg        200 mg Oral  Once 11/17/20 1315 11/17/20 1357   11/03/20 2200   meropenem (MERREM) 500 mg in sodium chloride 0.9 % 100 mL IVPB        500 mg 200 mL/hr over 30 Minutes Intravenous Every 24 hours 11/03/20 1500 11/06/20 2217   11/01/20 1000  anidulafungin (ERAXIS) 100 mg in sodium chloride 0.9 % 100 mL IVPB  Status:  Discontinued       "Followed by" Linked Group Details   100 mg 78 mL/hr over 100 Minutes Intravenous Every 24 hours 10/31/20 0916 11/01/20 1404   10/31/20 1015  meropenem (MERREM) 1 g in sodium chloride 0.9 % 100 mL IVPB  Status:  Discontinued        1 g 200 mL/hr over 30 Minutes Intravenous Every 8 hours 10/31/20 0916 11/03/20 1500   10/31/20 1015  linezolid (ZYVOX) IVPB 600 mg  Status:  Discontinued        600 mg 300 mL/hr over 60 Minutes Intravenous Every 12 hours 10/31/20 0916 11/02/20 0906   10/31/20 1015  anidulafungin (ERAXIS) 200 mg in sodium chloride 0.9 % 200 mL IVPB       "  Followed by" Linked Group Details   200 mg 78 mL/hr over 200 Minutes Intravenous  Once 10/31/20 0916 10/31/20 1630   10/25/20 1800  ceFAZolin (ANCEF) IVPB 2g/100 mL premix  Status:  Discontinued        2 g 200 mL/hr over 30 Minutes Intravenous Every 12 hours 10/25/20 1022 10/30/20 1103   10/23/20 2000  vancomycin (VANCOREADY) IVPB 750 mg/150 mL  Status:  Discontinued        750 mg 150 mL/hr over 60 Minutes Intravenous Every 24 hours 10/22/20 2036 10/25/20 1022   10/23/20 1800  piperacillin-tazobactam (ZOSYN) IVPB 3.375 g  Status:  Discontinued        3.375 g 100 mL/hr over 30 Minutes Intravenous Every 6 hours 10/23/20 1155 10/24/20 1422   10/23/20 0200  piperacillin-tazobactam (ZOSYN) IVPB 3.375 g  Status:  Discontinued        3.375 g 100 mL/hr over 30 Minutes Intravenous Every 8 hours 10/22/20 2036 10/23/20 1155   10/22/20 1630  vancomycin (VANCOREADY) IVPB 1500 mg/300 mL        1,500 mg 150 mL/hr over 120 Minutes Intravenous  Once 10/22/20 1537 10/22/20 2229   10/22/20 1630  piperacillin-tazobactam (ZOSYN) IVPB 2.25 g  Status:  Discontinued        2.25  g 100 mL/hr over 30 Minutes Intravenous Every 8 hours 10/22/20 1537 10/22/20 2036   10/22/20 1537  vancomycin variable dose per unstable renal function (pharmacist dosing)  Status:  Discontinued         Does not apply See admin instructions 10/22/20 1537 10/22/20 2036   10/19/20 0600  ceFAZolin (ANCEF) IVPB 2g/100 mL premix        2 g 200 mL/hr over 30 Minutes Intravenous To Radiology 10/18/20 1457 10/19/20 0938   10/10/20 0830  vancomycin (VANCOCIN) IVPB 1000 mg/200 mL premix       "Followed by" Linked Group Details   1,000 mg 200 mL/hr over 60 Minutes Intravenous Every 24 hours 10/09/20 0744 10/15/20 0902   10/09/20 0830  piperacillin-tazobactam (ZOSYN) IVPB 3.375 g        3.375 g 100 mL/hr over 30 Minutes Intravenous Every 6 hours 10/09/20 0744 10/16/20 0038   10/09/20 0830  vancomycin (VANCOREADY) IVPB 2000 mg/400 mL       "Followed by" Linked Group Details   2,000 mg 200 mL/hr over 120 Minutes Intravenous  Once 10/09/20 0744 10/09/20 1022   09/24/20 1000  ceFAZolin (ANCEF) IVPB 2g/100 mL premix  Status:  Discontinued        2 g 200 mL/hr over 30 Minutes Intravenous Every 12 hours 09/24/20 0801 09/24/20 1046   09/23/20 1400  vancomycin (VANCOCIN) IVPB 1000 mg/200 mL premix        1,000 mg 200 mL/hr over 60 Minutes Intravenous Every 24 hours 09/22/20 1436 09/28/20 1718   09/22/20 2200  ceFEPIme (MAXIPIME) 2 g in sodium chloride 0.9 % 100 mL IVPB  Status:  Discontinued        2 g 200 mL/hr over 30 Minutes Intravenous Every 12 hours 09/22/20 1436 09/24/20 0801   09/22/20 1030  ceFEPIme (MAXIPIME) 1 g in sodium chloride 0.9 % 100 mL IVPB        1 g 200 mL/hr over 30 Minutes Intravenous  Once 09/22/20 0934 09/22/20 1145   09/22/20 1015  vancomycin (VANCOCIN) IVPB 1000 mg/200 mL premix        1,000 mg 200 mL/hr over 60 Minutes Intravenous  Once 09/22/20 0934 09/22/20 1446  09/12/20 2200  ceFEPIme (MAXIPIME) 2 g in sodium chloride 0.9 % 100 mL IVPB        2 g 200 mL/hr over 30  Minutes Intravenous Every 12 hours 09/12/20 0732 09/14/20 2134   09/11/20 1400  ceFEPIme (MAXIPIME) 2 g in sodium chloride 0.9 % 100 mL IVPB  Status:  Discontinued        2 g 200 mL/hr over 30 Minutes Intravenous Every 8 hours 09/11/20 1126 09/12/20 0732   09/08/20 1000  vancomycin (VANCOREADY) IVPB 2000 mg/400 mL        2,000 mg 200 mL/hr over 120 Minutes Intravenous  Once 09/08/20 0857 09/08/20 1224   09/08/20 1000  ceFEPIme (MAXIPIME) 2 g in sodium chloride 0.9 % 100 mL IVPB  Status:  Discontinued        2 g 200 mL/hr over 30 Minutes Intravenous Every 12 hours 09/08/20 0857 09/11/20 1126   09/08/20 0856  vancomycin variable dose per unstable renal function (pharmacist dosing)  Status:  Discontinued         Does not apply See admin instructions 09/08/20 0857 09/09/20 0935   09/02/20 1600  cefTRIAXone (ROCEPHIN) 1 g in sodium chloride 0.9 % 100 mL IVPB  Status:  Discontinued        1 g 200 mL/hr over 30 Minutes Intravenous Every 24 hours 09/01/20 1811 09/02/20 0838   09/01/20 1800  fluconazole (DIFLUCAN) IVPB 400 mg        400 mg 50 mL/hr over 240 Minutes Intravenous  Once 09/01/20 1749 09/02/20 0603   09/01/20 1530  piperacillin-tazobactam (ZOSYN) IVPB 3.375 g        3.375 g 12.5 mL/hr over 240 Minutes Intravenous Every 8 hours 09/01/20 1514 09/05/20 2111   09/01/20 1000  levofloxacin (LEVAQUIN) tablet 250 mg  Status:  Discontinued        250 mg Oral Daily 08/31/20 1508 08/31/20 1735   08/31/20 1730  cefTRIAXone (ROCEPHIN) 1 g in sodium chloride 0.9 % 100 mL IVPB  Status:  Discontinued        1 g 200 mL/hr over 30 Minutes Intravenous Every 24 hours 08/31/20 1726 09/01/20 1513   08/31/20 1730  azithromycin (ZITHROMAX) 500 mg in sodium chloride 0.9 % 250 mL IVPB  Status:  Discontinued        500 mg 250 mL/hr over 60 Minutes Intravenous Every 24 hours 08/31/20 1726 09/02/20 0838       Objective: Vitals:   11/28/20 0425 11/28/20 0500 11/28/20 0653 11/28/20 0743  BP: 99/63 109/63  100/68 100/68  Pulse: (!) 107 (!) 112 (!) 107 (!) 108  Resp: 20 20  20   Temp: 98.8 F (37.1 C) 98 F (36.7 C) 97.8 F (36.6 C) 99.2 F (37.3 C)  TempSrc: Axillary Axillary Axillary Axillary  SpO2: 95% 95% 100% 97%  Weight:      Height:        Intake/Output Summary (Last 24 hours) at 11/28/2020 0754 Last data filed at 11/28/2020 0600 Gross per 24 hour  Intake 1788.65 ml  Output 1100 ml  Net 688.65 ml   Filed Weights   11/16/20 1500 11/16/20 1802 11/16/20 2120  Weight: 86 kg 85 kg 92.6 kg    Examination:   General exam: Very deconditioned, debilitated but overall comfortable looking today HEENT: Tracheostomy with secretions Respiratory system: Bilateral diminished air sounds, no wheezes or crackles  cardiovascular system: Sinus tachycardia  gastrointestinal system: Abdomen is nondistended, soft and nontended Central nervous system: Alert and awake Extremities:  No edema, no clubbing ,no cyanosis Skin: Stage IV sacral ulcer      Data Reviewed: I have personally reviewed following labs and imaging studies  CBC: Recent Labs  Lab 11/22/20 0640 11/24/20 0906 11/25/20 0440 11/26/20 0916  WBC 9.2 12.5* 15.4* 15.2*  NEUTROABS  --   --  10.0*  --   HGB 9.5* 8.9* 10.0* 8.9*  HCT 31.6* 28.9* 33.4* 29.5*  MCV 89.0 88.7 88.4 86.8  PLT 365 397 386 694*   Basic Metabolic Panel: Recent Labs  Lab 11/22/20 0640 11/24/20 0906 11/25/20 0440 11/26/20 0916 11/27/20 0115 11/28/20 0044  NA 135 137 135 134* 131* 132*  K 3.4* 3.8 4.9 4.3 4.6 3.9  CL 98 99 98 95* 94* 95*  CO2 22 25 24 25  20* 24  GLUCOSE 114* 101* 133* 105* 229* 224*  BUN 61* 45* 24* 61* 103* 47*  CREATININE 1.59* 2.15* 1.86* 2.59* 2.78* 1.56*  CALCIUM 10.5* 10.1 10.6* 10.4* 9.5 9.4  PHOS 7.0* 7.1*  --  6.6* 7.2*  --    GFR: Estimated Creatinine Clearance: 40.5 mL/min (A) (by C-G formula based on SCr of 1.56 mg/dL (H)). Liver Function Tests: Recent Labs  Lab 11/22/20 0640 11/24/20 0906 11/26/20 0916  11/27/20 0115  ALBUMIN 2.5* 2.5* 2.6* 2.6*   No results for input(s): LIPASE, AMYLASE in the last 168 hours. No results for input(s): AMMONIA in the last 168 hours. Coagulation Profile: No results for input(s): INR, PROTIME in the last 168 hours. Cardiac Enzymes: No results for input(s): CKTOTAL, CKMB, CKMBINDEX, TROPONINI in the last 168 hours. BNP (last 3 results) No results for input(s): PROBNP in the last 8760 hours. HbA1C: No results for input(s): HGBA1C in the last 72 hours. CBG: Recent Labs  Lab 11/26/20 2112 11/27/20 1238 11/27/20 1619 11/27/20 2154 11/28/20 0746  GLUCAP 276* 118* 145* 198* 168*   Lipid Profile: No results for input(s): CHOL, HDL, LDLCALC, TRIG, CHOLHDL, LDLDIRECT in the last 72 hours. Thyroid Function Tests: No results for input(s): TSH, T4TOTAL, FREET4, T3FREE, THYROIDAB in the last 72 hours. Anemia Panel: No results for input(s): VITAMINB12, FOLATE, FERRITIN, TIBC, IRON, RETICCTPCT in the last 72 hours. Sepsis Labs: No results for input(s): PROCALCITON, LATICACIDVEN in the last 168 hours.  Recent Results (from the past 240 hour(s))  Culture, respiratory (non-expectorated)     Status: None   Collection Time: 11/22/20  4:45 PM   Specimen: Tracheal Aspirate; Respiratory  Result Value Ref Range Status   Specimen Description TRACHEAL ASPIRATE  Final   Special Requests NONE  Final   Gram Stain   Final    ABUNDANT WBC PRESENT,BOTH PMN AND MONONUCLEAR ABUNDANT GRAM POSITIVE COCCI IN PAIRS Performed at [facility], [place] [address], [place], [place] [address]    Culture MODERATE STAPHYLOCOCCUS AUREUS  Final   Report Status 11/25/2020 FINAL  Final   Organism ID, Bacteria STAPHYLOCOCCUS AUREUS  Final      Susceptibility   Staphylococcus aureus - MIC*    CIPROFLOXACIN <=0.5 SENSITIVE Sensitive     ERYTHROMYCIN <=0.25 SENSITIVE Sensitive     GENTAMICIN <=0.5 SENSITIVE Sensitive     OXACILLIN <=0.25 SENSITIVE Sensitive     TETRACYCLINE <=1  SENSITIVE Sensitive     VANCOMYCIN <=0.5 SENSITIVE Sensitive     TRIMETH/SULFA <=10 SENSITIVE Sensitive     CLINDAMYCIN <=0.25 SENSITIVE Sensitive     RIFAMPIN <=0.5 SENSITIVE Sensitive     Inducible Clindamycin NEGATIVE Sensitive     * MODERATE STAPHYLOCOCCUS AUREUS  Radiology Studies: DG Chest 1 View  Result Date: 11/27/2020 CLINICAL DATA:  Atrial fibrillation. EXAM: CHEST  1 VIEW COMPARISON:  11/25/2020 FINDINGS: Tracheostomy tube in satisfactory position. Right jugular catheter tip in the superior vena cava. Feeding tube extending into the stomach. Normal sized heart. No significant change in bibasilar airspace opacity and linear densities. Unremarkable bones. IMPRESSION: No significant change in bibasilar atelectasis and pneumonia/scarring. Electronically Signed   By: Claudie Revering M.D.   On: 11/27/2020 10:42        Scheduled Meds: . apixaban  2.5 mg Oral BID  . chlorhexidine gluconate (MEDLINE KIT)  15 mL Mouth Rinse BID  . Chlorhexidine Gluconate Cloth  6 each Topical Daily  . cholecalciferol  2,000 Units Oral Daily  . clonazepam  0.25 mg Oral QHS  . collagenase   Topical BID  . darbepoetin (ARANESP) injection - DIALYSIS  100 mcg Intravenous Q Sat-HD  . feeding supplement (NEPRO CARB STEADY)  1,000 mL Per Tube Q24H  . feeding supplement (PROSource TF)  45 mL Per Tube TID  . fentaNYL  1 patch Transdermal Q72H  . guaiFENesin  15 mL Oral Q12H  . insulin aspart  0-5 Units Subcutaneous QHS  . insulin aspart  0-9 Units Subcutaneous TID WC  . mouth rinse  15 mL Mouth Rinse QID  . megestrol  200 mg Oral QID  . metoprolol tartrate  50 mg Oral BID  . midodrine  40 mg Oral TID WC  . multivitamin  1 tablet Oral QHS  . nutrition supplement (JUVEN)  1 packet Oral BID BM  . pantoprazole sodium  40 mg Oral BID  . sodium chloride flush  10-40 mL Intracatheter Q12H  . sodium chloride HYPERTONIC  4 mL Nebulization Daily   Continuous Infusions: . sodium chloride 5 mL/hr at  11/16/20 0935  . sodium chloride    . albumin human 25 g (11/16/20 1516)  .  ceFAZolin (ANCEF) IV 1 g (11/27/20 2052)     LOS: 88 days    Time spent:35 mins.More than 50% of that time was spent in counseling and/or coordination of care.      Shelly Coss, MD [facility] P1/30/2022, 7:54 AM

## 2020-11-29 ENCOUNTER — Inpatient Hospital Stay (HOSPITAL_COMMUNITY): Payer: Medicare Other

## 2020-11-29 DIAGNOSIS — I4891 Unspecified atrial fibrillation: Secondary | ICD-10-CM | POA: Diagnosis not present

## 2020-11-29 DIAGNOSIS — Z930 Tracheostomy status: Secondary | ICD-10-CM | POA: Diagnosis not present

## 2020-11-29 HISTORY — PX: IR FLUORO GUIDE CV LINE RIGHT: IMG2283

## 2020-11-29 LAB — GLUCOSE, CAPILLARY
Glucose-Capillary: 212 mg/dL — ABNORMAL HIGH (ref 70–99)
Glucose-Capillary: 171 mg/dL — ABNORMAL HIGH (ref 70–99)
Glucose-Capillary: 158 mg/dL — ABNORMAL HIGH (ref 70–99)
Glucose-Capillary: 212 mg/dL — ABNORMAL HIGH (ref 70–99)

## 2020-11-29 MED ORDER — LIDOCAINE-EPINEPHRINE 1 %-1:100000 IJ SOLN
INTRAMUSCULAR | Status: AC
  Filled 2020-11-29: qty 1

## 2020-11-29 MED ORDER — SODIUM CHLORIDE 3 % IN NEBU
4.0000 mL | INHALATION_SOLUTION | RESPIRATORY_TRACT | Status: DC | PRN
  Filled 2020-11-29: qty 4

## 2020-11-29 MED ORDER — MORPHINE SULFATE 10 MG/5ML PO SOLN: 5.0000 mg | ORAL | Status: DC | PRN

## 2020-11-29 MED ORDER — LIDOCAINE HCL 1 % IJ SOLN
INTRAMUSCULAR | Status: AC
  Filled 2020-11-29: qty 20

## 2020-11-29 MED ORDER — HEPARIN SODIUM (PORCINE) 1000 UNIT/ML IJ SOLN
INTRAMUSCULAR | Status: AC
  Filled 2020-11-29: qty 1

## 2020-11-29 MED ORDER — MORPHINE SULFATE (PF) 2 MG/ML IV SOLN
1.0000 mg | Freq: Every day | INTRAVENOUS | Status: DC | PRN
  Administered 2020-11-29: 1 mg via INTRAVENOUS
  Administered 2020-11-30: 4 mg via INTRAVENOUS
  Filled 2020-11-29: qty 2
  Filled 2020-11-29: qty 1
  Filled 2020-11-29: qty 2

## 2020-11-29 MED ORDER — CHLORHEXIDINE GLUCONATE 4 % EX LIQD
CUTANEOUS | Status: AC
  Filled 2020-11-29: qty 15

## 2020-11-29 NOTE — Progress Notes (Signed)
89 Days Post-Op    CC: Weakness/low blood pressure/elevated heart rate  Subjective: Patient comfortable in bed.  Patient on the hydrotherapy wound VAC.  She seems comfortable.  Pictures below.  Ostomy is working well.  Objective: Vital signs in last 24 hours: Temp:  [98 F (36.7 C)-99.3 F (37.4 C)] 99.3 F (37.4 C) (01/31 0801) Pulse Rate:  [94-109] 109 (01/31 0345) Resp:  [18-22] 20 (01/31 0345) BP: (113-120)/(57-82) 113/74 (01/31 0332) SpO2:  [97 %-100 %] 100 % (01/30 2013) FiO2 (%):  [21 %] 21 % (01/31 0345) Last BM Date: 11/28/20 600 p.o. 224 core tract feeding 500 urine 300 stool per colostomy Afebrile tachycardic, blood pressure stable Creatinine 1.56(1/30)  Intake/Output from previous day: 01/30 0701 - 01/31 0700 In: 1184 [P.O.:600; NG/GT:224] Out: 800 [Urine:500; Stool:300] Intake/Output this shift: No intake/output data recorded.  General appearance: alert, cooperative and no distress Skin: see pictures below  Abd:  Midline incision not seen pt undergoing hydro, RN says it looks fine.  Wound today, She is lying on there left side and as you can see there is some black colored tissue at the base, she had some bleeding last change and some of that is Silver nitrate, Hydrotherapy thinks it looks better.  Last measurement I am seeing is 13 x 15 cm, on 11/17/20.     11/22/20 Picture  Lab Results:  Recent Labs    11/26/20 0916  WBC 15.2*  HGB 8.9*  HCT 29.5*  PLT 410*    BMET Recent Labs    11/27/20 0115 11/28/20 0044  NA 131* 132*  K 4.6 3.9  CL 94* 95*  CO2 20* 24  GLUCOSE 229* 224*  BUN 103* 47*  CREATININE 2.78* 1.56*  CALCIUM 9.5 9.4   PT/INR No results for input(s): LABPROT, INR in the last 72 hours.  Recent Labs  Lab 11/24/20 0906 11/26/20 0916 11/27/20 0115  ALBUMIN 2.5* 2.6* 2.6*     Lipase     Component Value Date/Time   LIPASE 48 08/31/2020 2054     Medications: . apixaban  2.5 mg Oral BID  . chlorhexidine gluconate  (MEDLINE KIT)  15 mL Mouth Rinse BID  . Chlorhexidine Gluconate Cloth  6 each Topical Daily  . cholecalciferol  2,000 Units Oral Daily  . clonazepam  0.25 mg Oral QHS  . collagenase   Topical BID  . darbepoetin (ARANESP) injection - DIALYSIS  100 mcg Intravenous Q Sat-HD  . feeding supplement (NEPRO CARB STEADY)  1,000 mL Per Tube Q24H  . feeding supplement (PROSource TF)  45 mL Per Tube TID  . fentaNYL  1 patch Transdermal Q72H  . guaiFENesin  15 mL Oral Q12H  . insulin aspart  0-5 Units Subcutaneous QHS  . insulin aspart  0-9 Units Subcutaneous TID WC  . mouth rinse  15 mL Mouth Rinse QID  . megestrol  200 mg Oral QID  . metoprolol tartrate  50 mg Oral BID  . midodrine  40 mg Oral TID WC  . multivitamin  1 tablet Oral QHS  . nutrition supplement (JUVEN)  1 packet Oral BID BM  . pantoprazole sodium  40 mg Oral BID  . 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 Cortrak became clogged yesterday and calorie count initiated. Will follow up on results.  -No indication for G tube at this time  Sacral wound  -Slow improvement with hydrotherapy and local wound care. Would not recommend taking this patient to the OR for general anesthesia and surgical debridement at this time. Continue Veraflo wound vac per PT, ok to continue changes MWF or could decrease to T/F. Nonviable tissue softening up and seems to be debriding slowly. Once ready for  discharge she cannot go home with this vac. Could either transition back to wet to dry dressing changes, or if she ultimately develops enough healthy granulation tissue could consider applying a regular wound vac. She will need follow up with outpatient wound center.  Pneumoperitoneum,perforated duodenal ulcer S/pexploratory laparotomy, adhesiolysis, modified graham patch repair of perforated duodenal ulcer11/3 Dr. Bobbye Morton -POD#89 -tolerating diet and having bowel function - BID wet to dry dressing changes to midline abdominal wound  ID -currently none FEN -DYS 2 diet VTE -eliquis Foley -none Follow up -Dr. Bobbye Morton  Plan:  Hydrotherapy want to continue current irrigating vac therapy, for at least another week. We will continue to follow.        LOS: 89 days    Kerry Chisolm 11/29/2020 Please see Amion

## 2020-11-29 NOTE — Progress Notes (Signed)
OT Cancellation Note  Patient Details Name: Briah Nary MRN: [id number] DOB: 01-20-1951   Cancelled Treatment:    Reason Eval/Treat Not Completed: Other (comment) PT entering for hydrotherapy. Will follow-up for OT session as appropriate.   [name] 11/29/2020, 1:36 PM

## 2020-11-29 NOTE — Progress Notes (Addendum)
[facility] PROGRESS NOTE  Candace [name] UEA:[id number] DOB: 02-10-1951 DOA: 08/31/2020 PCP: Algis Greenhouse, MD       Status: Remains inpatient appropriate because:Ongoing diagnostic testing needed not appropriate for outpatient work up, Unsafe d/c plan, IV treatments appropriate due to intensity of illness or inability to take PO and Inpatient level of care appropriate due to severity of illness   Dispo: The patient is from: Home              Anticipated d/c is to: Home with home health               Anticipated d/c date is: > 3 days              Patient currently is not medically stable to d/c.  Barriers to discharge: Continues to require HD- unable to sit in chair/get in [facility] for OP HD-also requiring Cortrak for nocturnal feeds; still has trach with copiuous secretions at times  Considerations for home discharge: 1. Patient will need to continue wound VAC and current wound VAC is apparently not available in the home setting 2. It is likely she will need a specialty bed.  We are currently transitioning back to the [facility] rotational bed which will allow for better in bed mobility including the ability to "stand" with PT in the bed without actually getting patient out of bed 3. Likely would need to be decannulated to minimize other care issues after discharge 4. Would need to be able to either mobilize for outpatient hemodialysis or be a candidate for home dialysis 5. Would not have aggressive PT OT available in the home setting that she can achieve during hospitalization 6. Patient will have family members that are CNAs managing her after discharge but this would be 24/7 requirement and this could be an overwhelming issue once patient is actually home  Code Status: Full Family Communication: 1/27 daughter [name] at bedside DVT prophylaxis: Eliquis Vaccination status: Has not been vaccinated but did have severe COVID infection initially diagnosed on 08/23/2020.  Patient will be eligible  for COVID-vaccine after 11/23/2020   Foley catheter: No, purewick female urinary collection device   HPI: 70 year old female patient with prior history of diabetes, stage III chronic kidney disease, atrial fibrillation, hypertension, dyslipidemia and colon cancer that is post colectomy and colostomy.  Initially diagnosed with COVID on 08/23/20 she was admitted to the hospital due to dehydration, acute kidney injury and hypoxemic respiratory failure.  She was discharged home on 2 to 3 L of oxygen after being treated with IV steroids and remdesivir.  During that time although she had elevated inflammatory markers she had no embolic or thrombotic disease.  She had been on prophylactic dose heparin during the hospitalization and was placed on Eliquis for 2 weeks for after discharge  Patient returned to the ER on 08/31/2020 for complaints of not feeling well.  She was found to be in atrial fibrillation with RVR, hemorrhagic shock and work-up revealed a perforated duodenal ulcer.  She underwent exploratory laparotomy on 11/3.  She has had an extensive ICU stay with significant events as below.   11/2 Admitted with rapid Afib 11/3 OR with findings of perforated duodenal ulcer 11/10 progressive hemorrhagic shock, intubated, transfused, pressors, proned; started on CRRT in PM 11/16 Extubated. Re-intubated overnight due to respiratory distress and hypoxia with decreased mentation 11/18 Bronch, cultures sent 11/19 Hgb down getting blood 11/24 Spiked fever, resume empirical antimicrobial therapy 11/26 Hemorrhagic shock, hgb 5.6, increased pressors,  11/30  Per palliative "[name] understanding that patient is unfortunately very fragiledespite ongoing intensive medical treatment and full mechanical support. [name] that the familywantsto continue with all current interventions despite potential outcomes". 12/08 CRRT discontinued due to clotting 12/09 Family requested transfer to tertiary  care [facility]). [facility] denied transfer  12/10 CRRT restarted. Episodes of tachycardia, tachypnea that seem to improve with pain management 12/11 Back in shock. Pressor requirements up. CXR worse. ABX resumed 12/12 Still requiring inc pressors. Had hypoglycemic event. Basal insulin dosing adjusted 12/13 Pressor requirements better. Now hyperglycemic. Re-adjusted Glycemic control  12/19 Afebrile . Remains on dilaudid and heparin gtt, dilaudid gtt increased overnight for concern of pain / ongoing tachycardia, no other events . NE and precedex off 12/17. Ongoing CRRT- even UF, dosing lokelmia this morning 12/20 On CRRT. Renal plans for HD cath and moving to HD. Getting wound care 12/24 - renal stopping CRRT today and plans HD 10/24/20 . 40% fio2 on vent via Trach. TAchypenic and tachycardic. Afebrile but wbc up to 27.6K. On TF. On heparin gtt 12/25 - Back on CRRT. On vent via trach at 40% fio2, On scheduled dilaudid as add on to oxy. Per family request 12/24 - they felt scheduled oxy was not adequate and patient was showing signs of opioid withdrawal.  Patient also had worsening SIRS/sepsis syndrome. Had fever, rising wbc, worsening RR 40-60 and HR 140s sinus->started On abx yesterday. Fever some better today. WBC plateau at 28,.5K. On new levophed ->signifanct improvement in HR 77 and RR t0 20. On heparin gtt. On precedex gtt. On levophed gtt 99mg wthi midodrine. On TF 12/30 Remains on CRRT with intermittent pressor requirements. Ethics consult placed 12/29. Ethics rec time trial of CRRT 12/31 failed SBT with RR 40s. Several conversations between care tam and family, who are upset RE plan of care 1/1 back on pressors  1/4: On pressors, keeping even on CVVHD, HGB drop to 6.9, transfused 1 unit. Improving mental status 1/10 remains on low dose levophed 278m 1/11 off levo, attempting HD with UF for first time. Now tolerating intermittent HD. 1/19 patient transferred to progressive bed with tracheostomy on  35% FiO2.  has NG tube feeding.  No PEG tube.  Received dialysis on 1/18. 1/24 significant improvement and persistent tachycardia after introduction of twice daily beta-blocker.  Heart rates have improved from the 130s to the 98-104 range 1/24 core track clogged and therefore has been removed by nutrition team.  Calorie count in progress and we are weaning any sedating medications which could be contributing to patient's inability to eat. 1/27 continue w/ cuff #6 trach   Subjective: Patient awake.  No specific complaints.  Discussed possibility of discontinuing Duragesic patch in favor of OxyIR alone.  Patient states she does not need Duragesic patch any further.  States she does not like to eat her diet tray because the food tastes "nasty"    Objective: Vitals:   11/29/20 0345 11/29/20 0801  BP:    Pulse: (!) 109   Resp: 20   Temp:  99.3 F (37.4 C)  SpO2:      Intake/Output Summary (Last 24 hours) at 11/29/2020 0807 Last data filed at 11/28/2020 2202 Gross per 24 hour  Intake 1184 ml  Output 800 ml  Net 384 ml   Filed Weights   11/16/20 1500 11/16/20 1802 11/16/20 2120  Weight: 86 kg 85 kg 92.6 kg    Exam: Constitutional: Alert, no acute distress, calm Respiratory: Clear but slightly coarse to auscultation, no significant secretions  noted per trach at this time.  FiO2 21%. #6 cuffed trach Cardiovascular: Persistent tachycardia with rates between 91 and 120 bpm.  Normal heart sounds, extremities warm to touch with adequate capillary refill. Abdomen: On D2 diet with nectar thick liquids- LBM 1/26-core track tube in place for nocturnal feeds. Skin:  Massive sacral decubitus ulcer-wound VAC now in place-no other lesions Neurologic: CN 2-12 grossly intact. Sensation intact, DTR normal. Strength 1/5 x all 4 extremities.  Psychiatric: Awake, somewhat flat affect, oriented x3   Assessment/Plan: Acute problems: PAF maintaining sinus rhythm w/persistent tachycardia -Continue  metoprolol as BP tolerates-persistent sinus tachycardia secondary to deconditioning that worsens with activity and hemodialysis -Cardiology has seen in consultation -Echocardiogram this admission with an EF 50 to 55% with mild diastolic dysfunction parameters and normal RV systolic function -Continue Eliquis-benefits coordinator has determined that Eliquis is covered at [facility] [place] with a co-pay of $47  Acute respiratory failure secondary to COVID-pneumonia/tracheostomy -Patient stable on low-flow oxygen FiO2 21% -PCCM/trach team following -Repeat respiratory culture on 1/24 with abundant staph with sensitivities pending -Chest x-ray on 1/27 only demonstrates expected sequela of Covid pneumonia with improved ventilation in both lower lungs since 1/15.  This does not appear to be consistent with acute infection although her white count has increased to 15,400- on 1/26 she did have a T-max of 100.1 F.  Sputum has changed from white to yellow and patient has persistent leukocytosis 15,200 therefore has been started on Ancef IV -Continue  suctioning and chest PT-Vibra vest -Continue PMV training   Acute kidney injury secondary to COVID-related sepsis with shock superimposed on stage III chronic kidney disease -Continue intermittent hemodialysis as directed by the renal team -Avoid nephrotoxic medications -Continue Aranesp on dialysis days  Dysphagia/moderate to severe protein calorie malnutrition Nutrition Status: Nutrition Problem: Increased nutrient needs Etiology: wound healing Signs/Symptoms: estimated needs Interventions: Refer to RD note for recommendations  Estimated body mass index is 31.04 kg/m as calculated from the following:   Height as of this encounter: 5' 8"  (1.727 m).   Weight as of this encounter: 92.6 kg.  -Continue D2 diet/nectar thick liquids with nocturnal tube feeding - If enteral access needed more long-term surgery recommended contacting IR first and if they are  unable to proceed with gastrostomy tube  contact surgical team. -1/27 initiated Megace for appetite stimulant.  This medication can precipitate thrombotic events but patient is fully anticoagulated with Eliquis.  Acute hemorrhagic shock secondary to perforated duodenal ulcer/anemia of critical illness -Stable postoperatively -Has tolerated tube feedings and oral diet without any evidence of postoperative obstructive issues which can be common after duodenal oversewing with subsequent scar tissue formation -Hemoglobin remains stable greater than 8.0  Diabetes mellitus 2 -cont SSI-CBGs have ranged between 168 and 226 -HgbA1c 08/24/2020 and was 7.0 -Prior to admission patient was on metformin XR  Profound physical deconditioning/acute encephalopathy secondary to prolonged hospital stay/orthostasis -SNF recommended but family wants to take patient home -At this juncture patient unable to sit up in bed independently much less stand pivot and rotate to get into wheelchair and would be significant fall risk and care risk at home -In addition patient would need to be able to get in wheelchair to transport to dialysis if dialysis were to be continued in the outpatient setting -family refuses LTAC or SNF rehab due to pt prior request to NEVER be placed in any type of facility -Seroquel and Klonopin have been tapered and discontinued -Continue high-dose midodrine 3 times daily -Persistent tachycardia  secondary to severe deconditioning has improved with the introduction of beta-blockers -Continue PROM every 4 hours-patient encouraged to perform independent AROM as well -FAMILY THAT WILL PROVIDE CARE FOR PT WILL NEED TO ASSIST WITH CARE OF THIS PT AT [place]  Acute encephalopathy with ICU delirium -Delirium has resolved -Seroquel taper initiated this week.  Have decreased to 25 mg HS the next 3 nights then will discontinue -Continue Klonopin 0.25 mg BID for 3 more days and  discontinue -Duragesic taper dose decreased as above  Stage IV sacral decubitus -Not present on admission -Wound care nurse following and utilizing Santyl and hydrotherapy and now has wound VAC in place -Surgical team consulted.  Current recommendation is against pursuing general anesthesia to undergo extensive surgical procedure-hopefully can be reevaluated in the future if wound does not heal adequately.  As of 1/28 they have nothing further acutely to add but will be following peripherally. [facility] rotational bed  -Discontinue Duragesic patch.  Continue low-dose oxycodone for breakthrough pain -  11/24/2020 after [facility]      11/22/2020  Incision (Closed) 09/01/20 Abdomen (Active)  Date First Assessed/Time First Assessed: 09/01/20 1737   Location: Abdomen    Assessments 09/01/2020  6:25 PM 11/27/2020  1:00 PM  Dressing Type Gauze (Comment) Gauze (Comment)  Dressing - Changed  Dressing Change Frequency - Daily  Site / Wound Assessment Dressing in place / Unable to assess -  Drainage Amount None None     No Linked orders to display     Pressure Injury 09/17/20 Ear Left;Anterior;Posterior Stage 2 -  Partial thickness loss of dermis presenting as a shallow open injury with a red, pink wound bed without slough. (Active)  Date First Assessed/Time First Assessed: 09/17/20 0800   Location: Ear  Location Orientation: Left;Anterior;Posterior  Staging: Stage 2 -  Partial thickness loss of dermis presenting as a shallow open injury with a red, pink wound bed without slough. ...    Assessments 09/17/2020  8:00 AM 11/27/2020  8:30 PM  Dressing Type Foam - Lift dressing to assess site every shift None  Dressing Clean;Dry;Intact -  Dressing Change Frequency Every 3 days -  Site / Wound Assessment Dry;Pink -  Peri-wound Assessment Intact -  Wound Length (cm) 2 cm -  Wound Width (cm) 1 cm -  Wound Depth (cm) 0.25 cm -  Wound Surface Area (cm^2) 2 cm^2 -  Wound Volume (cm^3) 0.5 cm^3 -  Margins Unattached  edges (unapproximated) -  Drainage Amount Scant -  Drainage Description Serosanguineous -  Treatment Cleansed -     No Linked orders to display     Pressure Injury 09/25/20 Sacrum Bilateral;Medial Deep Tissue Pressure Injury - Purple or maroon localized area of discolored intact skin or blood-filled blister due to damage of underlying soft tissue from pressure and/or shear. Purple, non-blanchable, b (Active)  Date First Assessed/Time First Assessed: 09/25/20 2000   Location: Sacrum  Location Orientation: Bilateral;Medial  Staging: Deep Tissue Pressure Injury - Purple or maroon localized area of discolored intact skin or blood-filled blister due to damage o...    Assessments 09/25/2020  5:00 PM 11/26/2020 12:23 PM  Dressing Type Foam - Lift dressing to assess site every shift Other (Comment)  Dressing Changed;Clean;Dry;Intact Changed;Clean;Dry;Intact  Dressing Change Frequency - Monday, Wednesday, Friday  State of Healing - Eschar  Site / Wound Assessment - Yellow;Red  % Wound base Red or Granulating - 10%  % Wound base Yellow/Fibrinous Exudate - 80%  % Wound base  Black/Eschar - 10%  % Wound base Other/Granulation Tissue (Comment) - 0%  Margins - Unattached edges (unapproximated)  Drainage Amount - Moderate  Drainage Description - Serous;Serosanguineous  Treatment - Cleansed;Debridement (Selective);Negative pressure wound therapy     No Linked orders to display     Negative Pressure Wound Therapy Sacrum (Active)  Placement Date/Time: 11/22/20 1500   Wound Type: (c) Other (Comment)  Location: Sacrum    Assessments 11/22/2020  4:59 PM 11/28/2020  5:25 AM  Last dressing change 11/22/20 -  Site / Wound Assessment Granulation tissue;Pale;Yellow -  Peri-wound Assessment Intact -  Size see above -  Wound filler - Black foam 2 -  Wound filler - White foam 0 -  Wound filler - Nonadherent 0 -  Wound filler - Gauze 0 -  Cycle Continuous -  Target Pressure (mmHg) 125 -  Instillation Volume  26 mL -  Instillation Solution Normal Saline -  Instillation Soak Time 10 minutes -  Instillation Therapy Time 3.5 hours -  Canister Changed No -  Dressing Status Intact -  Drainage Amount None -  Output (mL) - 50 mL     No Linked orders to display    Hypomagnesemia -Continue oral magnesium -1/21 give 4 g magnesium IV x1 -Follow labs  Other problems: History of stage IV colon cancer -Old colostomy  DVT bilateral posterior tibial veins -Was not present during initial COVID admission -Continue Eliquis for now given severe debility and increased risk for developing recurrent DVT  Hypertension -Having issues with orthostasis requiring midodrine Prior to admission patient was on Norvasc  Dyslipidemia -Prior to admission patient was on Crestor-consider resuming soon once patient can swallow pills whole  Abnormal TSH -Nov 2021 TSH was 0.015-TSH and free T4, T3 normal this admission  Data Reviewed: Basic Metabolic Panel: Recent Labs  Lab 11/24/20 0906 11/25/20 0440 11/26/20 0916 11/27/20 0115 11/28/20 0044  NA 137 135 134* 131* 132*  K 3.8 4.9 4.3 4.6 3.9  CL 99 98 95* 94* 95*  CO2 25 24 25  20* 24  GLUCOSE 101* 133* 105* 229* 224*  BUN 45* 24* 61* 103* 47*  CREATININE 2.15* 1.86* 2.59* 2.78* 1.56*  CALCIUM 10.1 10.6* 10.4* 9.5 9.4  PHOS 7.1*  --  6.6* 7.2*  --    Liver Function Tests: Recent Labs  Lab 11/24/20 0906 11/26/20 0916 11/27/20 0115  ALBUMIN 2.5* 2.6* 2.6*   No results for input(s): LIPASE, AMYLASE in the last 168 hours. No results for input(s): AMMONIA in the last 168 hours. CBC: Recent Labs  Lab 11/24/20 0906 11/25/20 0440 11/26/20 0916  WBC 12.5* 15.4* 15.2*  NEUTROABS  --  10.0*  --   HGB 8.9* 10.0* 8.9*  HCT 28.9* 33.4* 29.5*  MCV 88.7 88.4 86.8  PLT 397 386 410*   Cardiac Enzymes: No results for input(s): CKTOTAL, CKMB, CKMBINDEX, TROPONINI in the last 168 hours. BNP (last 3 results) Recent Labs    09/07/20 0118 09/08/20 0446  09/09/20 0428  BNP 216.8* 432.5* 609.7*    ProBNP (last 3 results) No results for input(s): PROBNP in the last 8760 hours.  CBG: Recent Labs  Lab 11/28/20 0746 11/28/20 1334 11/28/20 1843 11/28/20 2155 11/29/20 0757  GLUCAP 168* 171* 174* 226* 212*    Recent Results (from the past 240 hour(s))  Culture, respiratory (non-expectorated)     Status: None   Collection Time: 11/22/20  4:45 PM   Specimen: Tracheal Aspirate; Respiratory  Result Value Ref Range Status   Specimen  Description TRACHEAL ASPIRATE  Final   Special Requests NONE  Final   Gram Stain   Final    ABUNDANT WBC PRESENT,BOTH PMN AND MONONUCLEAR ABUNDANT GRAM POSITIVE COCCI IN PAIRS Performed at [facility], [place] [address], [place], [place] [address]    Culture MODERATE STAPHYLOCOCCUS AUREUS  Final   Report Status 11/25/2020 FINAL  Final   Organism ID, Bacteria STAPHYLOCOCCUS AUREUS  Final      Susceptibility   Staphylococcus aureus - MIC*    CIPROFLOXACIN <=0.5 SENSITIVE Sensitive     ERYTHROMYCIN <=0.25 SENSITIVE Sensitive     GENTAMICIN <=0.5 SENSITIVE Sensitive     OXACILLIN <=0.25 SENSITIVE Sensitive     TETRACYCLINE <=1 SENSITIVE Sensitive     VANCOMYCIN <=0.5 SENSITIVE Sensitive     TRIMETH/SULFA <=10 SENSITIVE Sensitive     CLINDAMYCIN <=0.25 SENSITIVE Sensitive     RIFAMPIN <=0.5 SENSITIVE Sensitive     Inducible Clindamycin NEGATIVE Sensitive     * MODERATE STAPHYLOCOCCUS AUREUS     Studies: DG Chest 1 View  Result Date: 11/27/2020 CLINICAL DATA:  Atrial fibrillation. EXAM: CHEST  1 VIEW COMPARISON:  11/25/2020 FINDINGS: Tracheostomy tube in satisfactory position. Right jugular catheter tip in the superior vena cava. Feeding tube extending into the stomach. Normal sized heart. No significant change in bibasilar airspace opacity and linear densities. Unremarkable bones. IMPRESSION: No significant change in bibasilar atelectasis and pneumonia/scarring. Electronically Signed   By: Claudie Revering M.D.   On: 11/27/2020 10:42    Scheduled Meds: . apixaban  2.5 mg Oral BID  . chlorhexidine gluconate (MEDLINE KIT)  15 mL Mouth Rinse BID  . Chlorhexidine Gluconate Cloth  6 each Topical Daily  . cholecalciferol  2,000 Units Oral Daily  . clonazepam  0.25 mg Oral QHS  . collagenase   Topical BID  . darbepoetin (ARANESP) injection - DIALYSIS  100 mcg Intravenous Q Sat-HD  . feeding supplement (NEPRO CARB STEADY)  1,000 mL Per Tube Q24H  . feeding supplement (PROSource TF)  45 mL Per Tube TID  . fentaNYL  1 patch Transdermal Q72H  . guaiFENesin  15 mL Oral Q12H  . insulin aspart  0-5 Units Subcutaneous QHS  . insulin aspart  0-9 Units Subcutaneous TID WC  . mouth rinse  15 mL Mouth Rinse QID  . megestrol  200 mg Oral QID  . metoprolol tartrate  50 mg Oral BID  . midodrine  40 mg Oral TID WC  . multivitamin  1 tablet Oral QHS  . nutrition supplement (JUVEN)  1 packet Oral BID BM  . pantoprazole sodium  40 mg Oral BID  . sodium chloride flush  10-40 mL Intracatheter Q12H   Continuous Infusions: . sodium chloride 5 mL/hr at 11/16/20 0935  . sodium chloride    . albumin human 25 g (11/16/20 1516)  .  ceFAZolin (ANCEF) IV 1 g (11/28/20 2346)    Principal Problem:   Atrial fibrillation with RVR (HCC) Active Problems:   Acute respiratory failure due to COVID-19 (HCC)   New onset a-fib (HCC)   Leukocytosis   Atrial fibrillation with rapid ventricular response (HCC)   Hypoxia   Pressure injury of skin   Acute hypoxemic respiratory failure (HCC)   ARDS (adult respiratory distress syndrome) (HCC)   Perforated duodenal ulcer ([place])   On mechanically assisted ventilation ([place])   Palliative care by specialist   Goals of care, counseling/discussion   Shock ([place])   Acute and chronic respiratory failure (acute-on-chronic) (  [place])   Status post tracheostomy ([place])   ESRD (end stage renal disease) ([place])   HAP (hospital-acquired  pneumonia)   Consultants:  Cardiology  Surgery  Nephrology  Ethics  PCCM   Procedures: R PICC 11/5 >> A line 11/9 >> out ETT 11/9 > 11/16, 11/16 >> 09/21/2020, 09/21/2020 tracheostomy>> Lt [place] CV[facility] 11/9 >> R IJ trialysis >> out HD catheter 12/1 >>12/20 12/21 - 14.5 Fr, 23 cm right IJ tunneled hemodialysis catheter placement. Removal of indwelling subclavian catheter.   Antibiotics: Anti-infectives (From admission, onward)   Start     Dose/Rate Route Frequency Ordered Stop   11/27/20 2000  ceFAZolin (ANCEF) IVPB 1 g/50 mL premix        1 g 100 mL/hr over 30 Minutes Intravenous Every 24 hours 11/26/20 1248     11/26/20 1345  ceFAZolin (ANCEF) IVPB 1 g/50 mL premix        1 g 100 mL/hr over 30 Minutes Intravenous  Once 11/26/20 1248 11/26/20 1433   11/17/20 1415  fluconazole (DIFLUCAN) 40 MG/ML suspension 200 mg        200 mg Oral  Once 11/17/20 1315 11/17/20 1357   11/03/20 2200  meropenem (MERREM) 500 mg in sodium chloride 0.9 % 100 mL IVPB        500 mg 200 mL/hr over 30 Minutes Intravenous Every 24 hours 11/03/20 1500 11/06/20 2217   11/01/20 1000  anidulafungin (ERAXIS) 100 mg in sodium chloride 0.9 % 100 mL IVPB  Status:  Discontinued       "Followed by" Linked Group Details   100 mg 78 mL/hr over 100 Minutes Intravenous Every 24 hours 10/31/20 0916 11/01/20 1404   10/31/20 1015  meropenem (MERREM) 1 g in sodium chloride 0.9 % 100 mL IVPB  Status:  Discontinued        1 g 200 mL/hr over 30 Minutes Intravenous Every 8 hours 10/31/20 0916 11/03/20 1500   10/31/20 1015  linezolid (ZYVOX) IVPB 600 mg  Status:  Discontinued        600 mg 300 mL/hr over 60 Minutes Intravenous Every 12 hours 10/31/20 0916 11/02/20 0906   10/31/20 1015  anidulafungin (ERAXIS) 200 mg in sodium chloride 0.9 % 200 mL IVPB       "Followed by" Linked Group Details   200 mg 78 mL/hr over 200 Minutes Intravenous  Once 10/31/20 0916 10/31/20 1630   10/25/20 1800  ceFAZolin (ANCEF) IVPB 2g/100  mL premix  Status:  Discontinued        2 g 200 mL/hr over 30 Minutes Intravenous Every 12 hours 10/25/20 1022 10/30/20 1103   10/23/20 2000  vancomycin (VANCOREADY) IVPB 750 mg/150 mL  Status:  Discontinued        750 mg 150 mL/hr over 60 Minutes Intravenous Every 24 hours 10/22/20 2036 10/25/20 1022   10/23/20 1800  piperacillin-tazobactam (ZOSYN) IVPB 3.375 g  Status:  Discontinued        3.375 g 100 mL/hr over 30 Minutes Intravenous Every 6 hours 10/23/20 1155 10/24/20 1422   10/23/20 0200  piperacillin-tazobactam (ZOSYN) IVPB 3.375 g  Status:  Discontinued        3.375 g 100 mL/hr over 30 Minutes Intravenous Every 8 hours 10/22/20 2036 10/23/20 1155   10/22/20 1630  vancomycin (VANCOREADY) IVPB 1500 mg/300 mL        1,500 mg 150 mL/hr over 120 Minutes Intravenous  Once 10/22/20 1537 10/22/20 2229   10/22/20 1630  piperacillin-tazobactam (ZOSYN) IVPB 2.25 g  Status:  Discontinued        2.25 g 100 mL/hr over 30 Minutes Intravenous Every 8 hours 10/22/20 1537 10/22/20 2036   10/22/20 1537  vancomycin variable dose per unstable renal function (pharmacist dosing)  Status:  Discontinued         Does not apply See admin instructions 10/22/20 1537 10/22/20 2036   10/19/20 0600  ceFAZolin (ANCEF) IVPB 2g/100 mL premix        2 g 200 mL/hr over 30 Minutes Intravenous To Radiology 10/18/20 1457 10/19/20 0938   10/10/20 0830  vancomycin (VANCOCIN) IVPB 1000 mg/200 mL premix       "Followed by" Linked Group Details   1,000 mg 200 mL/hr over 60 Minutes Intravenous Every 24 hours 10/09/20 0744 10/15/20 0902   10/09/20 0830  piperacillin-tazobactam (ZOSYN) IVPB 3.375 g        3.375 g 100 mL/hr over 30 Minutes Intravenous Every 6 hours 10/09/20 0744 10/16/20 0038   10/09/20 0830  vancomycin (VANCOREADY) IVPB 2000 mg/400 mL       "Followed by" Linked Group Details   2,000 mg 200 mL/hr over 120 Minutes Intravenous  Once 10/09/20 0744 10/09/20 1022   09/24/20 1000  ceFAZolin (ANCEF) IVPB 2g/100  mL premix  Status:  Discontinued        2 g 200 mL/hr over 30 Minutes Intravenous Every 12 hours 09/24/20 0801 09/24/20 1046   09/23/20 1400  vancomycin (VANCOCIN) IVPB 1000 mg/200 mL premix        1,000 mg 200 mL/hr over 60 Minutes Intravenous Every 24 hours 09/22/20 1436 09/28/20 1718   09/22/20 2200  ceFEPIme (MAXIPIME) 2 g in sodium chloride 0.9 % 100 mL IVPB  Status:  Discontinued        2 g 200 mL/hr over 30 Minutes Intravenous Every 12 hours 09/22/20 1436 09/24/20 0801   09/22/20 1030  ceFEPIme (MAXIPIME) 1 g in sodium chloride 0.9 % 100 mL IVPB        1 g 200 mL/hr over 30 Minutes Intravenous  Once 09/22/20 0934 09/22/20 1145   09/22/20 1015  vancomycin (VANCOCIN) IVPB 1000 mg/200 mL premix        1,000 mg 200 mL/hr over 60 Minutes Intravenous  Once 09/22/20 0934 09/22/20 1446   09/12/20 2200  ceFEPIme (MAXIPIME) 2 g in sodium chloride 0.9 % 100 mL IVPB        2 g 200 mL/hr over 30 Minutes Intravenous Every 12 hours 09/12/20 0732 09/14/20 2134   09/11/20 1400  ceFEPIme (MAXIPIME) 2 g in sodium chloride 0.9 % 100 mL IVPB  Status:  Discontinued        2 g 200 mL/hr over 30 Minutes Intravenous Every 8 hours 09/11/20 1126 09/12/20 0732   09/08/20 1000  vancomycin (VANCOREADY) IVPB 2000 mg/400 mL        2,000 mg 200 mL/hr over 120 Minutes Intravenous  Once 09/08/20 0857 09/08/20 1224   09/08/20 1000  ceFEPIme (MAXIPIME) 2 g in sodium chloride 0.9 % 100 mL IVPB  Status:  Discontinued        2 g 200 mL/hr over 30 Minutes Intravenous Every 12 hours 09/08/20 0857 09/11/20 1126   09/08/20 0856  vancomycin variable dose per unstable renal function (pharmacist dosing)  Status:  Discontinued         Does not apply See admin instructions 09/08/20 0857 09/09/20 0935   09/02/20 1600  cefTRIAXone (ROCEPHIN) 1 g in sodium chloride 0.9 % 100 mL IVPB  Status:  Discontinued        1 g 200 mL/hr over 30 Minutes Intravenous Every 24 hours 09/01/20 1811 09/02/20 0838   09/01/20 1800  fluconazole  (DIFLUCAN) IVPB 400 mg        400 mg 50 mL/hr over 240 Minutes Intravenous  Once 09/01/20 1749 09/02/20 0603   09/01/20 1530  piperacillin-tazobactam (ZOSYN) IVPB 3.375 g        3.375 g 12.5 mL/hr over 240 Minutes Intravenous Every 8 hours 09/01/20 1514 09/05/20 2111   09/01/20 1000  levofloxacin (LEVAQUIN) tablet 250 mg  Status:  Discontinued        250 mg Oral Daily 08/31/20 1508 08/31/20 1735   08/31/20 1730  cefTRIAXone (ROCEPHIN) 1 g in sodium chloride 0.9 % 100 mL IVPB  Status:  Discontinued        1 g 200 mL/hr over 30 Minutes Intravenous Every 24 hours 08/31/20 1726 09/01/20 1513   08/31/20 1730  azithromycin (ZITHROMAX) 500 mg in sodium chloride 0.9 % 250 mL IVPB  Status:  Discontinued        500 mg 250 mL/hr over 60 Minutes Intravenous Every 24 hours 08/31/20 1726 09/02/20 0838       Time spent: 30 minutes    [name] ANP  [facility] 7 am - 330 pm/M-F for direct patient care and secure chat Please refer to Amion for contact info 89  days

## 2020-11-29 NOTE — Progress Notes (Signed)
Pharmacy Antibiotic Note  Candace [name] is a 70 y.o. female admitted on 08/31/2020 with pneumonia.  Pharmacy has been consulted for Cefazolin dosing. Patient with renal failure on iHD. Plan for HD 2/1, last HD 1/29 was net even, not tolerating well.   Plan: Continue Cefazolin 1 gm IV q24hr x 7 days per CCM Monitor dialysis schedule and clinical status  Height: 5\' 8"  (172.7 cm) Weight:  (Floor to weigh) IBW/kg (Calculated) : 63.9  Temp (24hrs), Avg:98.8 F (37.1 C), Min:98 F (36.7 C), Max:99.3 F (37.4 C)  Recent Labs  Lab 11/24/20 0906 11/25/20 0440 11/26/20 0916 11/27/20 0115 11/28/20 0044  WBC 12.5* 15.4* 15.2*  --   --   CREATININE 2.15* 1.86* 2.59* 2.78* 1.56*    Estimated Creatinine Clearance: 40.5 mL/min (A) (by C-G formula based on SCr of 1.56 mg/dL (H)).    No Known Allergies  Antimicrobials: Cefazolin 1/28 >> (2/4)   Thank you for allowing [place] to participate in this patients care.   Jens Som, PharmD Please see amion for complete clinical pharmacist phone list. 11/29/2020 10:17 AM

## 2020-11-29 NOTE — Progress Notes (Signed)
Physical Therapy Wound Treatment Patient Details  Name: Candace [name] MRN: [id number] Date of Birth: 01-May-1951  Today's Date: 11/29/2020 Time: 9702-6378 Time Calculation (min): 74 min  Subjective  Subjective: Pt awake and agreeable. Patient and Family Stated Goals: heal wound Date of Onset:  (unknown) Prior Treatments: prior hydro and Dakins  Pain Score:  8/10  Wound Assessment  Pressure Injury 09/25/20 Sacrum Bilateral;Medial Deep Tissue Pressure Injury - Purple or maroon localized area of discolored intact skin or blood-filled blister due to damage of underlying soft tissue from pressure and/or shear. Purple, non-blanchable, b (Active)  Wound Image   11/29/20 1702  Dressing Type Barrier Film (skin prep);Negative pressure wound therapy 11/29/20 1702  Dressing Changed 11/29/20 1702  Dressing Change Frequency Every 3 days 11/29/20 1702  State of Healing Early/partial granulation 11/29/20 1702  Site / Wound Assessment Yellow;Red;Black 11/29/20 1702  % Wound base Red or Granulating 20% 11/29/20 1702  % Wound base Yellow/Fibrinous Exudate 70% 11/29/20 1702  % Wound base Black/Eschar 10% 11/29/20 1702  % Wound base Other/Granulation Tissue (Comment) 0% 11/29/20 1702  Peri-wound Assessment Intact 11/29/20 1702  Wound Length (cm) 14.2 cm 11/18/20 1500  Wound Width (cm) 12 cm 11/18/20 1500  Wound Depth (cm) 5.4 cm 11/18/20 1500  Wound Surface Area (cm^2) 170.4 cm^2 11/18/20 1500  Wound Volume (cm^3) 920.16 cm^3 11/18/20 1500  Tunneling (cm) 0 11/19/20 1623  Undermining (cm) 0 11/19/20 1623  Margins Unattached edges (unapproximated) 11/29/20 1702  Drainage Amount Moderate 11/29/20 1702  Drainage Description Sanguineous 11/29/20 1702  Treatment Debridement (Selective);Hydrotherapy (Pulse lavage);Packing (Saline gauze) 11/29/20 1702      Hydrotherapy Pulsed lavage therapy - wound location: sacrum Pulsed Lavage with Suction (psi): 12 psi Pulsed Lavage with Suction - Normal Saline Used:  1000 mL Pulsed Lavage Tip: Tip with splash shield Selective Debridement Selective Debridement - Location: sacrum Selective Debridement - Tools Used: Forceps;Scalpel Selective Debridement - Tissue Removed: necrotic adipose, eschar   Wound Assessment and Plan  Wound Therapy - Assess/Plan/Recommendations Wound Therapy - Clinical Statement: In general the wound is benefiting from the [facility] therapy, but pt still needs significant selective debridement between changes. Unfortunately, pain control is an issue, thus delaying progress with sharp debridement. Discussed with NP for medication changes; NP added IV morphine for hydrotherapy treatments. Wound Therapy - Functional Problem List: Global weakness and immobility Factors Delaying/Impairing Wound Healing: Diabetes Mellitus;Immobility;Multiple medical problems;Other (comment) (poor nutrition) Hydrotherapy Plan: Debridement;Pulsatile lavage with suction;Patient/family education;Dressing change Wound Therapy - Frequency: 6X / week Wound Therapy - Follow Up Recommendations: Skilled nursing facility Wound Plan: see above  Wound Therapy Goals- Improve the function of patient's integumentary system by progressing the wound(s) through the phases of wound healing (inflammation - proliferation - remodeling) by: Decrease Necrotic Tissue to: 50% Decrease Necrotic Tissue - Progress: Progressing toward goal Increase Granulation Tissue to: 50% Increase Granulation Tissue - Progress: Progressing toward goal Goals/treatment plan/discharge plan were made with and agreed upon by patient/family: Yes Time For Goal Achievement: 7 days Wound Therapy - Potential for Goals: Fair  Goals will be updated until maximal potential achieved or discharge criteria met.  Discharge criteria: when goals achieved, discharge from hospital, MD decision/surgical intervention, no progress towards goals, refusal/missing three consecutive treatments without notification or medical  reason.  GP  [name], PT, DPT Acute Rehabilitation Services Pager [phone] Office [phone]      [name] 11/29/2020, 5:06 PM

## 2020-11-29 NOTE — Progress Notes (Signed)
NAMEJake [name], MRN:  [id number], DOB:  1951/03/03, LOS: 89 ADMISSION DATE:  08/31/2020, CONSULTATION DATE:  09/07/2020 REFERRING MD:  Dr Candiss Norse, CHIEF COMPLAINT:  Acute resp failure  Brief History   70 year old female who was previously diagnosed with Covid 08/23/2020.  Admitted 11/2 with AF-RVR, found to have a perforated duodenal underwent exploratory laparotomy with Phillip Heal patch placement 11/3.  Past Medical History  Covid pneumonia Atrial fibrillation CKD stage III Diabetes mellitus Hypertension Colon cancer Hyperlipidemia  Significant Hospital Events   11/2 Admitted  11/3 OR with findings of perforated duodenal ulcer 11/10 progressive hemorrhagic shock, intubated, transfused, pressors, proned; started on CRRT in PM 11/03 Exploratory laparotomy, Phillip Heal patch, lysis of adhesion for duodenal ulceration postop day 6 11/16 Extubated. Re-intubated overnight due to respiratory distress and hypoxia with decreased mentation 11/18 Bronch, cultures sent 11/19 Hgb down getting blood 11/24 Spiked fever resume empirical antimicrobial therapy 11/26 Hemorrhagic shock, hgb 5.6, increased pressors, CT A/P  11/30 Per palliative "[name] expresses understanding that patient is unfortunately very fragile despite ongoing intensive medical treatment and full mechanical support. She indicates that the family wants to continue with all current interventions despite potential outcomes". 12/08 CRRT discontinued due to clotting 12/09 Family requested transfer to tertiary care [facility]). [facility] denied transfer  12/10 CRRT restarted. Episodes of tachycardia, tachypnea that seem to improve with pain management 12/11 Back in shock. Pressor requirements up. CXR worse. ABX resumed 12/12 Still requiring inc pressors. Had hypoglycemic event. Basal insulin dosing adjusted 12/13 Pressor requirements better. Now hyperglycemic. Re-adjusted Glycemic control  12/14 Changed dilaudid to1/2 dosing from day further. D/c  vasopressin.  Goals of care reconfirmed with daughter.  Patient continues to desire aggressive care.  Not open to discussing any other option, patient family continues to be hopeful that she will be discharged to home with full recovery in spite of multiple attempts by staff to prepare them that this is unlikely scenario 12/15 Dilaudid discontinued sending cortisol for ongoing pressor dependence 12/16 Serum cortisol <20, added stress dose steroids.  PRN Dilaudid, attempting not go back on Dilaudid infusion 12/17 Developed worsening tachycardia during the evening hours received initially IV albumin, followed by resuming IV Dilaudid with question of suboptimal pain control.  Currently looks better back on Dilaudid drip.  We have been able to wean pressors after adding stress dose steroids; near arrest - bradycardia, better with atropine  12/19 Afebrile . Remains on dilaudid and heparin gtt, dilaudid gtt increased overnight for concern of pain / ongoing tachycardia, no other events . NE and precedex off 12/17. Ongoing CRRT- even UF, dosing lokelmia this morning 12/20 On CRRT.  Renal plans for HD cath and moving to HD. Getting wound care  12/23 -No vent weaning per RT, remains on full support, #8 trach in place but previously had #6, no documentation of change noted in chart Afebrile / WBC 20.4  Vent - 30% FiO2, PEEP 5 Glucose range 176-212 I/O 465 ml stool, 3.6L removed with HD, -1.1L in last 24 hours  RN reports ongoing periods of tachypnea / vent dyssynchrony that responds to dilaudid   12/24 - renal stopping CRRT today and plans HD 10/24/20 . 40% fio2 on vent via Trach. TAchypenic and tachycardic. Afebrile but wbc up to 27.6K. On TF. Onn heparin gtt  12/25 - Back on CRRT. On vent via trach at 40% fio2, On scheduled dilaudid as add on to oxy. Per family request 12/24 - they felt scheduled oxy was not adequate and patient was showing  signs of opioid withdrawal.  Patient also had  worsening SIRS/sepsis  syndrome. Had fever, rising wbc, worsening RR 40-60 and HR 140s sinus-> started On abx yesterday. Fever some better today. WBC plateau at 28,.5K. On new levophed -> signifanct improvement in HR 77 and RR t0 20. On heparin gtt.  On precedex gtt. On levophed gtt 4mcg wthi midodrine. On TF 12/30 Remains on CRRT with intermittent pressor requirements. Ethics consult placed evening of 12/29. Ethics rec time trial of CRRT 12/31 failed SBT with RR 40s. Several conversations between care tam and family, who are upset RE plan of care 1/1 back on pressors  1/4: On pressors, keeping even on CVVHD, HGB drop to 6.9, transfused 1 unit. Improving mental status 1/10 remains on low dose levophed 37mcg 1/11 off levo, attempting HD with UF for first time.  -ethics consulted 12/30-- recs for time limited trial of CRRT, please see separate Ethics consult note for full details. Futility policy reviewed with family by ethics. Ongoing meetings with Dr. Hulen Skains with nursing administration and family. The ultimate goal is to transition her safely to HD from CRRT. 11/16/2020 again will be placed on intermittent hemodialysis. 11/16/2018 wheeze criteria for transition to progressive care since she has been off the ventilator for 72 hours. With an order for transfer to progressive care. 11/18/2020 is doing well on progressive care unit. 1/24 copious secretions 1/27 downsized to #6 cuffed 1/29 rapid response called during HD for SVT  Consults:  Cardiology [facility] Nephrology  Ethics  Procedures:  R PICC 11/5 >> A line 11/9 >> out ETT 11/9 > 11/16, 11/16 >> 09/21/2020, 09/21/2020 tracheostomy>> Lt [place] CVL 11/9 >> R IJ trialysis >> out HD catheter 12/1 >>12/20 12/21 - 14.5 Fr, 23 cm right IJ tunneled hemodialysis catheter placement.  Removal of indwelling subclavian catheter.   Significant Diagnostic Tests:  11/3 CT abd/ pelvis > 1. Positive for bowel perforation: Pneumoperitoneum and intermediate density free fluid in the  abdomen. Prior total colectomy. The specific site of perforation is unclear-oral contrast present to the proximal jejunum has not obviously leaked. Note that there may be small bowel loops adherent to the ventral abdominal wall along the greater curve of the stomach. 2. Extensive bilateral lower lung pneumonia. No pleural effusion. 3. Other abdominal and pelvic viscera are stable since 2015, including bilateral adrenal adenomas. Chronic renal parapelvic cysts. 4. Aortic Atherosclerosis 11/3 TTE > EF 70-75%, RV not well visualized, mildly reduced RV systolic function 93/81 CT chest/ abd/ pelvis> 1. Interval progression of diffuse bilateral hazy ground-glass airspace opacities with more focal areas of consolidation at the lung bases 2. Trace bilateral pleural effusions. 3. Postsurgical changes the abdomen as detailed above. No evidence for a postoperative abscess, however evaluation is limited by lack of IV contrast. 4. There is a 1.9 cm cystic appearing lesion located in the pancreatic body. This was not present on the patient's CT from 2015.  Follow-up with an outpatient contrast enhanced MRI is recommended. 5. The endometrial stripe appears diffusely thickened. Follow-up with pelvic ultrasound is recommended. Aortic Atherosclerosis 11/14 LE doppler studies > + DVT of right posterior tibial and peroneal vein, +dVT of left posterior tibial vein  11/26 CT ABD PEL > liver unremarkable, distended gallbladder with layering tiny gallstones versus sludge, no duct dilatation, mild hyperdensity right upper pole renal collecting system new.  No evidence of retroperitoneal bleeding.  Multifocal lower lobe predominant pulmonary infiltrates/pneumonia.  Small left pleural effusion. 1/16 Venous duplex RUE >> negative Micro Data:  11/10  MRSA PCR > neg 11/10 BC x 2 > neg 09/22/2020 blood cultures x2>> S epi.  09/22/2020 sputum culture>> MRSA Blood 12/1 >> negative. S.epi 1 out of 2, likely contaminant BCx 2  12/12 >> neg xxx Trach aspirate 12/24 - FEW STAPHYLOCOCCUS AUREUS  Blood 12/24 > Negative  Blood 1/2: neg Sputum 1/4: MSSA resp 1/24 MSSA  Antimicrobials:  azithro 11/2 >11/3 Ceftriaxone 11/2  Fluconazole 11/3 Zosyn 11/3 >> 11/7 Vanc 11/10 off Cefepime 11/10 > 11/16 09/22/2020 vancomycin for MRSA PNA >> 12/2 xxxx Vanc 12/11> 12/17 Zosyn 12/11> 12/17 xxxx vanc 12/24 (staph resp cutlure) >> 12/28 Zosyn 12/24 - 12/27 Cefazolin 12/27 >>1/1 linezolid 1/2 eraxis 1/2  Meropenem 1/2 > 1/8 1/28 Ancef >>  Subjective:   Events over weekend noted, rapid response called during HD. HD catheter issues noted Afebrile Remains on 28% trach collar  Objective   Blood pressure 113/74, pulse (!) 109, temperature 99.3 F (37.4 C), temperature source Oral, resp. rate 20, height 5\' 8"  (1.727 m), weight 92.6 kg, SpO2 100 %.    FiO2 (%):  [21 %] 21 %   Intake/Output Summary (Last 24 hours) at 11/29/2020 0909 Last data filed at 11/28/2020 2202 Gross per 24 hour  Intake 1184 ml  Output 800 ml  Net 384 ml   Filed Weights   11/16/20 1500 11/16/20 1802 11/16/20 2120  Weight: 86 kg 85 kg 92.6 kg   Physical Exam  Awake but frail appearing elderly woman Tracheostomy #6 , cuffed, PM valve Chest no accessory muscle use, decreased breath sounds bilateral Cardiac S1-S2 tachy Abdomen soft, nontender Extremities -no edema, good pulses   .  Labs show mild hyponatremia Chest x-ray 1/ 29 independently reviewed, unchanged bibasilar airspace disease compared to 1/26   Resolved problems:  Previously hemorrhagic shock requiring PRBCs, Septic shock, MRSA pneumonia s/p 8 days abx Complicated by MRSA healthcare associated pneumonia (treated) Peritonitis and perforated ulcer post repair 09/01/20   Assessment:  Candace [name] is a 70 y.o. with history of COVID 19 infection requiring hospitalization in October 2021 with current admission for hemorrhagic shock secondary to perforated gastric ulcer  requiring surgical intervention and [place]. 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  Consider changing to 4 cuffless once secretions decrease but ideally would like to see more progress with PT before considering decannulation  MSSA HAP -no new infiltrates but has low-grade fever and leukocytosis Ancef for 7 days until 2/4   PersiPAF, stent SVT, sinus tachycardia -Continue metoprolol  AKI Lab Results  Component Value Date   CREATININE 1.56 (H) 11/28/2020   CREATININE 2.78 (H) 11/27/2020   CREATININE 2.59 (H) 11/26/2020    HD Per nephrology Needing high-dose midodrine for dialysis  DVT lower extremity Asymmetric right upper extremity edema/swelling negative ultrasound Eliquis Per primary  Acute metabolic encephalopathy : Prolonged critical illness, sedation/pain/anxiety -Can taper Seroquel and clonazepam to off  Critical Illness Myopathy Severe deconditioning  DM2 with hyperglycemia CBG (last 3)  Recent Labs    11/28/20 1843 11/28/20 2155 11/29/20 0757  GLUCAP 174* 226* 212*   Stage IV sacral ulcer Per primary , general surgery following  History of stage IV colon cancer 1.9 cm cystic-appearing lesion located in pancreatic body no intervention at this time.   Will need a trach/dialysis facility,  daughter optimistic about going home.  We will consider decannulation only if she makes more progress with PT PCCM to see again on Friday   Best practice:  Diet: TFs plus dysphagia 2 Pain/Anxiety/Delirium protocol (if indicated): Fentanyl patch VAP protocol (if indicated): yes DVT prophylaxis: Eliquis GI prophylaxis: Protonix Glucose control: SSI, TF coverage, lantus Mobility: PT Family  communication: daughter per primary   Kara Mead MD. Shade Flood. [facility] See Amion for pager  If no response to pager , please call [phone]  After 7:00 pm call Elink  [phone]    11/29/2020, 9:09 AM

## 2020-11-29 NOTE — Procedures (Signed)
Interventional Radiology Procedure Note  Procedure: Tunneled catheter exchange  Findings: Please refer to procedural dictation for full description. Cuff exposed with catheter retracted approximately 5 cm.  23 cm tunneled HD cath replaced via indwelling track, with tip now in right atrium.  Complications: None immediate  Estimated Blood Loss: < 5 ml  Recommendations: Catheter ready for immediate use.   Ruthann Cancer, MD

## 2020-11-29 NOTE — Progress Notes (Signed)
[facility] Progress Note     Assessment:   1. HD dep't AKI.CRRT stopped 11/03/2020.[place] placed on 10/19/20. Family desired ongoing full scope of care, Now on IHD- last done 1/29-  Tolerating poorly 2. SVT 3. H/o mixed shock: hemorrhagic and septic shock; stable 4. Anemia;  on ESA 5. Perforated duodenal ulcer s/p exlap and graham patch placement 6. AHRF 2/2 ARDS s/p trach, per ccm 7. ARDS 2/2 COVID-19 Pneumonia 8. A.fib with RVR, recurrent this AM 9. Severe calorie malnutrition 10. H/o stage IV colon cancer 11. Hypercalcemia- immobility, stable  PLAN: - HD plan next Tues - Was net even with HD Saturday given tachy - hopefully will  be able to tolerate with next treatment -TDC cuff exposed, exit site looks normal; IR consult placed for non urgent exchange.  Discussed with floor RN that the catheter is not as secure so caution when moving her.  - Complex dispo planning noted.  Dr. Johnney Ou discussed with daughter that home dialysis isn't an option right out of hospital and the most appropriate dc would be LTACH or SNF but she is not willing to entertain those as options. She is not opposed to outpt dialysis clinic, just [facility] or SNF.    Subjective:    No new issues. Seems alert, looks at you but does not really respond to questions.  Looks like she has been able to eat   Objective:   BP 113/74 (BP Location: Left Arm)   Pulse (!) 109   Temp 99.1 F (37.3 C) (Oral)   Resp 20   Ht 5' 8"  (1.727 m)   Wt 92.6 kg   SpO2 100%   BMI 31.04 kg/m   Intake/Output Summary (Last 24 hours) at 11/29/2020 0759 Last data filed at 11/28/2020 2202 Gross per 24 hour  Intake 1184 ml  Output 800 ml  Net 384 ml   Weight change:   Physical Exam: Gen: Chronically ill looking female lying on bed -  Looks at provider but not sure she is responding to questions HEENT: trach in place CVS: tachy, S1-S2 normal Resp: inc RR, trach; scattered ant rhonchi, no rales Abd: soft,  nontender Ext:  No edema appreciated  ACCESS: R IJ TDC cuff exposed  Imaging: DG Chest 1 View  Result Date: 11/27/2020 CLINICAL DATA:  Atrial fibrillation. EXAM: CHEST  1 VIEW COMPARISON:  11/25/2020 FINDINGS: Tracheostomy tube in satisfactory position. Right jugular catheter tip in the superior vena cava. Feeding tube extending into the stomach. Normal sized heart. No significant change in bibasilar airspace opacity and linear densities. Unremarkable bones. IMPRESSION: No significant change in bibasilar atelectasis and pneumonia/scarring. Electronically Signed   By: Claudie Revering M.D.   On: 11/27/2020 10:42    Labs: BMET Recent Labs  Lab 11/24/20 0906 11/25/20 0440 11/26/20 0916 11/27/20 0115 11/28/20 0044  NA 137 135 134* 131* 132*  K 3.8 4.9 4.3 4.6 3.9  CL 99 98 95* 94* 95*  CO2 25 24 25  20* 24  GLUCOSE 101* 133* 105* 229* 224*  BUN 45* 24* 61* 103* 47*  CREATININE 2.15* 1.86* 2.59* 2.78* 1.56*  CALCIUM 10.1 10.6* 10.4* 9.5 9.4  PHOS 7.1*  --  6.6* 7.2*  --    CBC Recent Labs  Lab 11/24/20 0906 11/25/20 0440 11/26/20 0916  WBC 12.5* 15.4* 15.2*  NEUTROABS  --  10.0*  --   HGB 8.9* 10.0* 8.9*  HCT 28.9* 33.4* 29.5*  MCV 88.7 88.4 86.8  PLT 397 386 410*  Medications:    . apixaban  2.5 mg Oral BID  . chlorhexidine gluconate (MEDLINE KIT)  15 mL Mouth Rinse BID  . Chlorhexidine Gluconate Cloth  6 each Topical Daily  . cholecalciferol  2,000 Units Oral Daily  . clonazepam  0.25 mg Oral QHS  . collagenase   Topical BID  . darbepoetin (ARANESP) injection - DIALYSIS  100 mcg Intravenous Q Sat-HD  . feeding supplement (NEPRO CARB STEADY)  1,000 mL Per Tube Q24H  . feeding supplement (PROSource TF)  45 mL Per Tube TID  . fentaNYL  1 patch Transdermal Q72H  . guaiFENesin  15 mL Oral Q12H  . insulin aspart  0-5 Units Subcutaneous QHS  . insulin aspart  0-9 Units Subcutaneous TID WC  . mouth rinse  15 mL Mouth Rinse QID  . megestrol  200 mg Oral QID  . metoprolol  tartrate  50 mg Oral BID  . midodrine  40 mg Oral TID WC  . multivitamin  1 tablet Oral QHS  . nutrition supplement (JUVEN)  1 packet Oral BID BM  . pantoprazole sodium  40 mg Oral BID  . sodium chloride flush  10-40 mL Intracatheter Q12H      Louis Meckel, MD 11/29/2020, 7:59 AM

## 2020-11-29 NOTE — Progress Notes (Addendum)
Nutrition Follow-up  DOCUMENTATION CODES:   Not applicable  INTERVENTION:   -Continue renal MVI daily -Continue Magic cup TID with meals, each supplement provides 290 kcal and 9 grams of protein -Continue Nepro Shake po TID, each supplement provides 425 kcal and 19 grams protein -[place] yogurt with meal tray per pt/ daughter request -Continue nocturnal feedings:  Nepro @ 65 ml/hr x 12 hours via cortrak tube  45 ml Prosource TF TID  Regimen provides 1524 kcals, 96 grams protein, and 570 ml free water daily, meeting 76% of estimated kcal needs and 96% of estimated protein needs.   -Continue 1 packet Juven BID via cortrak tube, each packet provides 95 calories, 2.5 grams of protein (collagen), and 9.8 grams of carbohydrate (3 grams sugar); also contains 7 grams of L-arginine and L-glutamine, 300 mg vitamin C, 15 mg vitamin E, 1.2 mcg vitamin B-12, 9.5 mg zinc, 200 mg calcium, and 1.5 g  Calcium Beta-hydroxy-Beta-methylbutyrate to support wound healing  NUTRITION DIAGNOSIS:   Increased nutrient needs related to wound healing as evidenced by estimated needs.  Ongoing  GOAL:   Patient will meet greater than or equal to 90% of their needs  Progressing; being addressed via PO intake, oral nutrition supplements, and nocturnal TF   MONITOR:   PO intake,Supplement acceptance,Labs,Weight trends,TF tolerance,Skin,I & O's  REASON FOR ASSESSMENT:   Consult New TPN/TNA  ASSESSMENT:   70 year old female patient with h/o diabetes, stage III chronic kidney disease, atrial fibrillation, hypertension, dyslipidemia and colon cancer s/p colectomy and colostomy who was admitted with COVID on 08/23/20 along with acute kidney injury and hypoxemic respiratory failure.  Pt was discharged home on 2 to 3 L of oxygen but returned to the ER on 08/31/2020 with atrial fibrillation with RVR and hemorrhagic shock r/t perforated duodenal ulcer now s/p exploratory laparotomy on 11/3 with adhesiolysis and  modified graham patch.  1/22- transitioned to nocturnal TF 1/24- cortrak tube clogged, plan to hold off on cortrak tube replacement and continue with calorie count 1/26- cortrak replacement deferred until 11/26/20 1/28- cortrak tube replaced and nocturnal feeding resumed   Reviewed I/O's: +384 ml x 24 hours and -9.3 L since 11/15/20  UOP: 500 ml x 24 hours  Colostomy output: 300 ml x 24 hours  Pt unavailable at time of visit.   Pt continues to receive hydrotherapy for DPTI to sacrum.  Pt is fed by staff, but intake remains minimal and inadequate. Noted documented meal completions 10%. Per RN notes, pt consumes only 100% of pureed fruit and half orange magic cup last night at dinner. Family believes that medication side effects are contributing to poor oral intake. PEG is being considered.   Per daughter, pt does not like hospital food and plans to pre-order meals for her to assist her in obtaining food items that she prefers. She confirms that she is consuming minimal items on meal trays and using Magic Cup mostly to take medications. Daughter also reports pt often chews meat, but spits it out after chewing and has done this for years. Pt also requesting [place] yogurt with meals trays- this has been added to [employer] meal ordering system.   Pt continues to receive nocturnal feedings and is tolerating well.   Medications reviewed and include vitamin D3 and aranesp. Megace started on 11/25/20 per RD recommendations.   Labs reviewed: CBGS: [id number] (inpatient orders for glycemic control are 0-9 units insulin aspart TID with meals and 0-5 units insulin aspart daily at bedtime).   Diet Order:  Diet Order            DIET DYS 2 Room service appropriate? No; Fluid consistency: Nectar Thick  Diet effective now                 EDUCATION NEEDS:   Not appropriate for education at this time  Skin:  Skin Assessment: Skin Integrity Issues: Skin Integrity Issues:: Stage II DTI:  sacrum Stage I: n/a Stage II: ear Incisions: abdomen  Last BM:  1/23 type 7 - 719ml documented via colostomy  Height:   Ht Readings from Last 1 Encounters:  09/20/20 5\' 8"  (1.727 m)    Weight:   Wt Readings from Last 1 Encounters:  08/23/20 99.8 kg    BMI:  Body mass index is 31.04 kg/m.  Estimated Nutritional Needs:   Kcal:  2000-2300kcal/day  Protein:  100-120g/day  Fluid:  UOP +1L    Loistine Chance, RD, LDN, [place] Registered Dietitian II Certified Diabetes Care and Education Specialist Please refer to [facility] for RD and/or RD on-call/weekend/after hours pager

## 2020-11-30 DIAGNOSIS — I4891 Unspecified atrial fibrillation: Secondary | ICD-10-CM | POA: Diagnosis not present

## 2020-11-30 LAB — GLUCOSE, CAPILLARY
Glucose-Capillary: 333 mg/dL — ABNORMAL HIGH (ref 70–99)
Glucose-Capillary: 129 mg/dL — ABNORMAL HIGH (ref 70–99)
Glucose-Capillary: 263 mg/dL — ABNORMAL HIGH (ref 70–99)

## 2020-11-30 LAB — CBC
HCT: 27.2 % — ABNORMAL LOW (ref 36.0–46.0)
Hemoglobin: 8.3 g/dL — ABNORMAL LOW (ref 12.0–15.0)
MCH: 26.1 pg (ref 26.0–34.0)
MCHC: 30.5 g/dL (ref 30.0–36.0)
MCV: 85.5 fL (ref 80.0–100.0)
Platelets: 369 10*3/uL (ref 150–400)
RBC: 3.18 MIL/uL — ABNORMAL LOW (ref 3.87–5.11)
RDW: 15.9 % — ABNORMAL HIGH (ref 11.5–15.5)
WBC: 16.4 10*3/uL — ABNORMAL HIGH (ref 4.0–10.5)
nRBC: 0 % (ref 0.0–0.2)

## 2020-11-30 LAB — RENAL FUNCTION PANEL
Albumin: 2.3 g/dL — ABNORMAL LOW (ref 3.5–5.0)
Anion gap: 12 (ref 5–15)
BUN: 108 mg/dL — ABNORMAL HIGH (ref 8–23)
CO2: 23 mmol/L (ref 22–32)
Calcium: 10 mg/dL (ref 8.9–10.3)
Chloride: 96 mmol/L — ABNORMAL LOW (ref 98–111)
Creatinine, Ser: 1.7 mg/dL — ABNORMAL HIGH (ref 0.44–1.00)
GFR, Estimated: 32 mL/min — ABNORMAL LOW (ref >60)
Glucose, Bld: 294 mg/dL — ABNORMAL HIGH (ref 70–99)
Phosphorus: 4.5 mg/dL (ref 2.5–4.6)
Potassium: 3.4 mmol/L — ABNORMAL LOW (ref 3.5–5.1)
Sodium: 131 mmol/L — ABNORMAL LOW (ref 135–145)

## 2020-11-30 MED ORDER — HEPARIN SODIUM (PORCINE) 1000 UNIT/ML IJ SOLN
INTRAMUSCULAR | Status: AC
  Administered 2020-11-30: 1000 [IU]
  Filled 2020-11-30: qty 4

## 2020-11-30 NOTE — Progress Notes (Signed)
[facility] Progress Note     Assessment:   1. HD dep't AKI.CRRT stopped 11/03/2020. Family desired ongoing full scope of care, Now on IHD- last done 1/29-  Tolerating poorly- due today  2. SVT 3. H/o mixed shock: hemorrhagic and septic shock; stable 4. Anemia;  on ESA 5. Perforated duodenal ulcer s/p exlap and graham patch placement 6. AHRF 2/2 ARDS s/p trach, per ccm 7. ARDS 2/2 COVID-19 Pneumonia-  trach 8. A.fib with RVR, recurrent this AM 9. Severe calorie malnutrition 10. H/o stage IV colon cancer 11. Hypercalcemia- immobility, stable  PLAN: - HD plan next today - Was net even with HD Saturday given tachy - hopefully will  be able to tolerate with next treatment today -TDC cuff exposed, exit site looks normal; IR consult placed for non urgent exchange- done yest.   - Complex dispo planning noted.  Dr. Johnney Ou discussed with daughter that home dialysis isn't an option right out of hospital and the most appropriate dc would be LTACH or SNF but she is not willing to entertain those as options. She is not opposed to outpt dialysis clinic, just [facility] or SNF.    Subjective:    No new issues. Seems alert, looks at you but does not really respond to questions.  Looks like she has been able to eat   Objective:   BP 113/71 (BP Location: Left Arm)   Pulse (!) 111   Temp 97.9 F (36.6 C) (Axillary)   Resp 20   Ht 5' 8"  (1.727 m)   Wt 92.6 kg   SpO2 100%   BMI 31.04 kg/m   Intake/Output Summary (Last 24 hours) at 11/30/2020 3536 Last data filed at 11/30/2020 1443 Gross per 24 hour  Intake 1525 ml  Output 2225 ml  Net -700 ml   Weight change:   Physical Exam: Gen: Chronically ill looking female lying on bed -  Looks at provider but not sure she is responding to questions HEENT: trach in place CVS: tachy, S1-S2 normal Resp: inc RR, trach; scattered ant rhonchi, no rales Abd: soft, nontender Ext:  No edema appreciated  ACCESS: R IJ TDC cuff  exposed  Imaging: IR Fluoro Guide CV Line Right  Result Date: 11/29/2020 INDICATION: 70 year old female with history of end-stage renal disease on hemodialysis status post tunneled hemodialysis catheter placement on 10/19/2020, presenting with malfunctioning catheter, exposed cuff. EXAM: TUNNELED CENTRAL VENOUS HEMODIALYSIS CATHETER REPLACEMENT WITH FLUOROSCOPIC GUIDANCE MEDICATIONS: None. FLUOROSCOPY TIME:  0 minutes 7 seconds, 4 mGy COMPLICATIONS: None immediate. PROCEDURE: Informed written consent was obtained from the patient after a discussion of the risks, benefits, and alternatives to treatment. Questions regarding the procedure were encouraged and answered. The skin and external portion of the existing hemodialysis catheter was prepped with chlorhexidine in a sterile fashion, and a sterile drape was applied covering the operative field. Maximum barrier sterile technique with sterile gowns and gloves were used for the procedure. A timeout was performed prior to the initiation of the procedure. Both lumens of the hemodialysis catheter were cannulated with a stiff Glidewire and advanced to the level of the right atrium. Under intermittent fluoroscopic guidance, the existing dialysis catheter was exchanged for a new 23 cm tip to cuff palindrome dialysis catheter with tips ultimately terminating within the right atrium. Final catheter positioning was confirmed and documented with a spot radiographic image. The catheter aspirates and flushes normally. The catheter was flushed with appropriate volume heparin dwells. The catheter exit site was secured with 2, 0  silk retention sutures. Both lumens were heparinized. A dressing was placed. The patient tolerated the procedure well without immediate post procedural complication. IMPRESSION: Successful replacement of 23 cm tip to cuff tunneled hemodialysis catheter with tips terminating within the right atrium. The catheter is ready for immediate use. Ruthann Cancer,  MD [facility] Electronically Signed   By: Ruthann Cancer MD   On: 11/29/2020 11:59    Labs: BMET Recent Labs  Lab 11/24/20 6301 11/25/20 0440 11/26/20 0916 11/27/20 0115 11/28/20 0044  NA 137 135 134* 131* 132*  K 3.8 4.9 4.3 4.6 3.9  CL 99 98 95* 94* 95*  CO2 25 24 25  20* 24  GLUCOSE 101* 133* 105* 229* 224*  BUN 45* 24* 61* 103* 47*  CREATININE 2.15* 1.86* 2.59* 2.78* 1.56*  CALCIUM 10.1 10.6* 10.4* 9.5 9.4  PHOS 7.1*  --  6.6* 7.2*  --    CBC Recent Labs  Lab 11/24/20 0906 11/25/20 0440 11/26/20 0916  WBC 12.5* 15.4* 15.2*  NEUTROABS  --  10.0*  --   HGB 8.9* 10.0* 8.9*  HCT 28.9* 33.4* 29.5*  MCV 88.7 88.4 86.8  PLT 397 386 410*    Medications:    . apixaban  2.5 mg Oral BID  . chlorhexidine gluconate (MEDLINE KIT)  15 mL Mouth Rinse BID  . Chlorhexidine Gluconate Cloth  6 each Topical Daily  . cholecalciferol  2,000 Units Oral Daily  . clonazepam  0.25 mg Oral QHS  . collagenase   Topical BID  . darbepoetin (ARANESP) injection - DIALYSIS  100 mcg Intravenous Q Sat-HD  . feeding supplement (NEPRO CARB STEADY)  1,000 mL Per Tube Q24H  . feeding supplement (PROSource TF)  45 mL Per Tube TID  . guaiFENesin  15 mL Oral Q12H  . insulin aspart  0-5 Units Subcutaneous QHS  . insulin aspart  0-9 Units Subcutaneous TID WC  . mouth rinse  15 mL Mouth Rinse QID  . megestrol  200 mg Oral QID  . metoprolol tartrate  50 mg Oral BID  . midodrine  40 mg Oral TID WC  . multivitamin  1 tablet Oral QHS  . nutrition supplement (JUVEN)  1 packet Oral BID BM  . pantoprazole sodium  40 mg Oral BID  . sodium chloride flush  10-40 mL Intracatheter Q12H      Louis Meckel, MD 11/30/2020, 8:24 AM

## 2020-11-30 NOTE — Progress Notes (Signed)
Per HD RN patient did not receive prn albumin because she was not symptomatic for hypotension. Patient verbalized not feeling not feeling well, but per RN assessment did not verbalize nor display signs and symptoms of hypotension. Vital sign taken upon return to unit. Blood pressure stable.

## 2020-11-30 NOTE — Plan of Care (Signed)
  Problem: Education: Goal: Utilization of techniques to improve thought processes will improve Outcome: Progressing Goal: Knowledge of the prescribed therapeutic regimen will improve Outcome: Progressing   

## 2020-11-30 NOTE — Progress Notes (Signed)
OT Cancellation Note  Patient Details Name: Candace [name] MRN: [id number] DOB: Jun 16, 1951   Cancelled Treatment:    Reason Eval/Treat Not Completed: Patient at procedure or test/ unavailable (Leaving for HD upon OT arrival. Discussed therapy plans with daughter and patient. Will return as schedule allows.)  [place], OTR/L Acute Rehab Pager: [phone] Office: [phone] 11/30/2020, 11:50 AM

## 2020-11-30 NOTE — Progress Notes (Signed)
PT Cancellation Note  Patient Details Name: Candace [name] MRN: [id number] DOB: 1951-07-14   Cancelled Treatment:    Reason Eval/Treat Not Completed: Patient at procedure or test/unavailable.  Pt at HD.  PT to check back tomorrow and coordinate with hydro therapist for attempts at [place] to chair.   Thanks,  Verdene Lennert, PT, DPT  Acute Rehabilitation [phone] pager #[phone] office       Wells Guiles B Chamar Broughton 11/30/2020, 1:28 PM

## 2020-11-30 NOTE — Progress Notes (Signed)
PROGRESS NOTE    Candace [name]  NFA:[id number] DOB: 1951-03-24 DOA: 08/31/2020 PCP: Algis Greenhouse, MD   Brief Narrative: LLOS. 77 days in intensive care unit, most of the events are as per hospital records and previous providers documentation.  Patient does have history of colon cancer status post colon resection with colostomy bag, hyperlipidemia, type 2 diabetes, stage III chronic kidney disease.  She was admitted to the hospital and treated for COVID-19 pneumonia and was discharged back home.  Sent to the ER by her home care nurse due to elevated heart rate. 70 year old female who was recently diagnosed with COVID on 08/23/2020 came back to the ER with not feeling well, she was admitted on 08/31/2020 with A. fib with RVR, hemorrhagic shock and found to have perforated duodenal ulcer for that she underwent exploratory laparotomy on 11/3.  She had extensive ICU stay with significant events as below.  11/2 Admitted with rapid Afib 11/3 OR with findings of perforated duodenal ulcer 11/10 progressive hemorrhagic shock, intubated, transfused, pressors, proned; started on CRRT in PM 11/16 Extubated. Re-intubated overnight due to respiratory distress and hypoxia with decreased mentation 11/18 Bronch, cultures sent 11/19 Hgb down getting blood 11/24 Spiked fever, resume empirical antimicrobial therapy 11/26 Hemorrhagic shock, hgb 5.6, increased pressors,  11/30 Per palliative "Candace [name] that patient is unfortunately very fragiledespite ongoing intensive medical treatment and full mechanical support. [name] that the familywantsto continue with all current interventions despite potential outcomes". 12/08 CRRT discontinued due to clotting 12/09 Family requested transfer to tertiary care [facility] denied transfer  12/10 CRRT restarted. Episodes of tachycardia, tachypnea that seem to improve with pain management 12/11 Back in shock. Pressor requirements up. CXR worse. ABX  resumed 12/12 Still requiring inc pressors. Had hypoglycemic event. Basal insulin dosing adjusted 12/13 Pressor requirements better. Now hyperglycemic. Re-adjusted Glycemic control  12/19 Afebrile . Remains on dilaudid and heparin gtt, dilaudid gtt increased overnight for concern of pain / ongoing tachycardia, no other events . NE and precedex off 12/17. Ongoing CRRT- even UF, dosing lokelmia this morning 12/20 On CRRT. Renal plans for HD cath and moving to HD. Getting wound care 12/24 - renal stopping CRRT today and plans HD 10/24/20 . 40% fio2 on vent via Trach. TAchypenic and tachycardic. Afebrile but wbc up to 27.6K. On TF. On heparin gtt 12/25 - Back on CRRT. On vent via trach at 40% fio2, On scheduled dilaudid as add on to oxy. Per family request 12/24 - they felt scheduled oxy was not adequate and patient was showing signs of opioid withdrawal.  Patient also had worsening SIRS/sepsis syndrome. Had fever, rising wbc, worsening RR 40-60 and HR 140s sinus->started On abx yesterday. Fever some better today. WBC plateau at 28,.5K. On new levophed ->signifanct improvement in HR 77 and RR t0 20. On heparin gtt. On precedex gtt. On levophed gtt 53mg wthi midodrine. On TF 12/30 Remains on CRRT with intermittent pressor requirements. Ethics consult placed 12/29. Ethics rec time trial of CRRT 12/31 failed SBT with RR 40s. Several conversations between care tam and family, who are upset RE plan of care 1/1 back on pressors  1/4: On pressors, keeping even on CVVHD, HGB drop to 6.9, transfused 1 unit. Improving mental status 1/10 remains on low dose levophed 280m 1/11 off levo, attempting HD with UF for first time. Now tolerating intermittent HD. 1/19 patient transferred to progressive bed with tracheostomy on 35% FiO2.  has NG tube feeding.  No PEG tube.  Receiving dialysis.  11/30/20: Looks comfortable today.  Being fed at the bedside.  Also being fed through feeding tube.  Not in any kind of  distress.  Still in sinus tachycardia.  She says she feels ok  Assessment & Plan:   Principal Problem:   Atrial fibrillation with RVR (HCC) Active Problems:   Acute respiratory failure due to COVID-19 ([place])   New onset a-fib (HCC)   Leukocytosis   Atrial fibrillation with rapid ventricular response (HCC)   Hypoxia   Pressure injury of skin   Acute hypoxemic respiratory failure (HCC)   ARDS (adult respiratory distress syndrome) (HCC)   Perforated duodenal ulcer ([place])   On mechanically assisted ventilation ([place])   Palliative care by specialist   Goals of care, counseling/discussion   Shock ([place])   Acute and chronic respiratory failure (acute-on-chronic) ([place])   Status post tracheostomy ([place])   ESRD (end stage renal disease) ([place])   HAP (hospital-acquired pneumonia)   Paroxysmal A. fib with RVR: Hospital course remarkable for paroxysmal A. fib with RVR but had been lately on normal sinus rhythm.  She was in sinus tachycardia this morning.  Continue metoprolol.  BP soft  and is on midodrine.  Cardiology were following her here.  They recommended symptomatic treatment and beta-blocker titration as tolerated.  We will monitor her on telemetry.  On PRN IV metoprolol if her blood pressure allows.  Acute respiratory secondary to Covid pneumonia: Status post tracheostomy.  Hospital course remarkable for multiple bacterial pneumonias, ventilator dependent respiratory failure.  Completed antibiotic therapy.  Currently on trach collar on 28% FiO2.  Respiratory, PCCM following.  Continue pulmonary toileting, dysphagia therapy, suctioning.  Patient was able to tolerate Passy-Muir valve. PCCM holding for downsize her tracheostomy because of persistent secretions.. She continues to have thick secretions from her tracheostomy.  Needs frequent suctioning.  Chest x-ray done on 11/27/29  did not show any new pneumonia.  She has been started on cefazolin for positive tracheal culture showing MSSA,last day of  Abx will be 2/4 We have to minimize nebulizing treatment because of tachycardia.  Given hypertonic saline to loosen the secretions.  Dysphagia/poor oral intake.  Speech therapy following.  Passed barium swallow evaluation and was started on dysphagia 2 diet. Continues to have poor oral intake.  Her Dobbhoff was clogged and was removed on 1/24.Another feeding tube placed and currently on tube feeding  also.  Acute metabolic encephalopathy: Continue monitor mental status.  Improved.  Able to follow command.  Be judicious on  sedatives.  AKI: Nephrology following.  On intermittent dialysis.  On midodrine for blood pressure support.  Family interested in dialysis at home but not feasible as per nephrology.  Current recommendation is for skilled nursing facility/LTAC level of care.  Social worker, nephrology closely following for this. Status post tunneled catheter exchange.  Pneumoperitoneum/perforated duodenal ulcer:status post exploratory laparotomy, adhesiolysis, modified Graham patch, repair of perforated ulcer on 11/3 by Dr. Bobbye Morton week.  Tolerating oral diet.  Surgery recommended twice daily wet-to-dry dressing of midline abdominal wound  Hemorrhagic shock: Resolved.  Blood pressure currently stable.  DVT of lower extremity: On Eliquis for anticoagulation.  Anemia of critical illness: Received multiple transfusions during this hospitalization.  Total of 12 units of PRBC.  Currently hemoglobin stable.  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  Also on Seroquel, Klonopin PT/OT recommended skilled nursing facility placement but is very challenging due to need of dialysis, concurrent tracheostomy, pressure ulcer.  Possible LTAC.  As per the daughter, she is  interested on taking her mom to home with home health.  Type 2 diabetes mellitus with hyperglycemia: Episodes of hypoglycemia after extremity feeds.   Continue sliding scale insulin.  Monitor blood sugars  Sacral  decubitus ulcer: Not present on admission.  Has extensive sacral decubitus ulcer.  Wound care following.  Also seen by general surgery.  Recommended against anesthesia/surgical procedure.  Continue wound care.  Currently receiving hydrotherapy  History of stage IV colon cancer: Has colostomy.  Outpatient follow-up recommended           Nutrition Problem: Increased nutrient needs Etiology: wound healing      DVT prophylaxis:Eliquis Code Status: Full Family Communication: None at bedside Status is: Inpatient  Remains inpatient appropriate because:Unsafe d/c plan   Dispo: The patient is from: Home              Anticipated d/c is to: LTAC vs Home with home health              Anticipated d/c date is: > 3 days              Patient currently is not medically stable to d/c.   Difficult to place patient Yes      Consultants: Cardiology, surgery, nephrology, ethics, PCCM  Procedures:R PICC 11/5 >> A line 11/9 >> out ETT 11/9 > 11/16, 11/16 >> 09/21/2020, 09/21/2020 tracheostomy>> Lt [place] CVL 11/9 >> R IJ trialysis >> out HD catheter 12/1 >>12/20 12/21 - 14.5 Fr, 23 cm right IJ tunneled hemodialysis catheter placement. Removal of indwelling subclavian catheter.   Antimicrobials:  Anti-infectives (From admission, onward)   Start     Dose/Rate Route Frequency Ordered Stop   11/27/20 2000  ceFAZolin (ANCEF) IVPB 1 g/50 mL premix        1 g 100 mL/hr over 30 Minutes Intravenous Every 24 hours 11/26/20 1248 12/04/20 1959   11/26/20 1345  ceFAZolin (ANCEF) IVPB 1 g/50 mL premix        1 g 100 mL/hr over 30 Minutes Intravenous  Once 11/26/20 1248 11/26/20 1433   11/17/20 1415  fluconazole (DIFLUCAN) 40 MG/ML suspension 200 mg        200 mg Oral  Once 11/17/20 1315 11/17/20 1357   11/03/20 2200  meropenem (MERREM) 500 mg in sodium chloride 0.9 % 100 mL IVPB        500 mg 200 mL/hr over 30 Minutes Intravenous  Every 24 hours 11/03/20 1500 11/06/20 2217   11/01/20 1000  anidulafungin (ERAXIS) 100 mg in sodium chloride 0.9 % 100 mL IVPB  Status:  Discontinued       "Followed by" Linked Group Details   100 mg 78 mL/hr over 100 Minutes Intravenous Every 24 hours 10/31/20 0916 11/01/20 1404   10/31/20 1015  meropenem (MERREM) 1 g in sodium chloride 0.9 % 100 mL IVPB  Status:  Discontinued        1 g 200 mL/hr over 30 Minutes Intravenous Every 8 hours 10/31/20 0916 11/03/20 1500   10/31/20 1015  linezolid (ZYVOX) IVPB 600 mg  Status:  Discontinued        600 mg 300 mL/hr over 60 Minutes Intravenous Every 12 hours 10/31/20 0916 11/02/20 0906   10/31/20 1015  anidulafungin (ERAXIS) 200 mg in sodium chloride 0.9 % 200 mL IVPB       "Followed by" Linked Group Details   200 mg 78 mL/hr over 200 Minutes Intravenous  Once  10/31/20 0916 10/31/20 1630   10/25/20 1800  ceFAZolin (ANCEF) IVPB 2g/100 mL premix  Status:  Discontinued        2 g 200 mL/hr over 30 Minutes Intravenous Every 12 hours 10/25/20 1022 10/30/20 1103   10/23/20 2000  vancomycin (VANCOREADY) IVPB 750 mg/150 mL  Status:  Discontinued        750 mg 150 mL/hr over 60 Minutes Intravenous Every 24 hours 10/22/20 2036 10/25/20 1022   10/23/20 1800  piperacillin-tazobactam (ZOSYN) IVPB 3.375 g  Status:  Discontinued        3.375 g 100 mL/hr over 30 Minutes Intravenous Every 6 hours 10/23/20 1155 10/24/20 1422   10/23/20 0200  piperacillin-tazobactam (ZOSYN) IVPB 3.375 g  Status:  Discontinued        3.375 g 100 mL/hr over 30 Minutes Intravenous Every 8 hours 10/22/20 2036 10/23/20 1155   10/22/20 1630  vancomycin (VANCOREADY) IVPB 1500 mg/300 mL        1,500 mg 150 mL/hr over 120 Minutes Intravenous  Once 10/22/20 1537 10/22/20 2229   10/22/20 1630  piperacillin-tazobactam (ZOSYN) IVPB 2.25 g  Status:  Discontinued        2.25 g 100 mL/hr over 30 Minutes Intravenous Every 8 hours 10/22/20 1537 10/22/20 2036   10/22/20 1537  vancomycin  variable dose per unstable renal function (pharmacist dosing)  Status:  Discontinued         Does not apply See admin instructions 10/22/20 1537 10/22/20 2036   10/19/20 0600  ceFAZolin (ANCEF) IVPB 2g/100 mL premix        2 g 200 mL/hr over 30 Minutes Intravenous To Radiology 10/18/20 1457 10/19/20 0938   10/10/20 0830  vancomycin (VANCOCIN) IVPB 1000 mg/200 mL premix       "Followed by" Linked Group Details   1,000 mg 200 mL/hr over 60 Minutes Intravenous Every 24 hours 10/09/20 0744 10/15/20 0902   10/09/20 0830  piperacillin-tazobactam (ZOSYN) IVPB 3.375 g        3.375 g 100 mL/hr over 30 Minutes Intravenous Every 6 hours 10/09/20 0744 10/16/20 0038   10/09/20 0830  vancomycin (VANCOREADY) IVPB 2000 mg/400 mL       "Followed by" Linked Group Details   2,000 mg 200 mL/hr over 120 Minutes Intravenous  Once 10/09/20 0744 10/09/20 1022   09/24/20 1000  ceFAZolin (ANCEF) IVPB 2g/100 mL premix  Status:  Discontinued        2 g 200 mL/hr over 30 Minutes Intravenous Every 12 hours 09/24/20 0801 09/24/20 1046   09/23/20 1400  vancomycin (VANCOCIN) IVPB 1000 mg/200 mL premix        1,000 mg 200 mL/hr over 60 Minutes Intravenous Every 24 hours 09/22/20 1436 09/28/20 1718   09/22/20 2200  ceFEPIme (MAXIPIME) 2 g in sodium chloride 0.9 % 100 mL IVPB  Status:  Discontinued        2 g 200 mL/hr over 30 Minutes Intravenous Every 12 hours 09/22/20 1436 09/24/20 0801   09/22/20 1030  ceFEPIme (MAXIPIME) 1 g in sodium chloride 0.9 % 100 mL IVPB        1 g 200 mL/hr over 30 Minutes Intravenous  Once 09/22/20 0934 09/22/20 1145   09/22/20 1015  vancomycin (VANCOCIN) IVPB 1000 mg/200 mL premix        1,000 mg 200 mL/hr over 60 Minutes Intravenous  Once 09/22/20 0934 09/22/20 1446   09/12/20 2200  ceFEPIme (MAXIPIME) 2 g in sodium chloride 0.9 % 100 mL IVPB  2 g 200 mL/hr over 30 Minutes Intravenous Every 12 hours 09/12/20 0732 09/14/20 2134   09/11/20 1400  ceFEPIme (MAXIPIME) 2 g in sodium  chloride 0.9 % 100 mL IVPB  Status:  Discontinued        2 g 200 mL/hr over 30 Minutes Intravenous Every 8 hours 09/11/20 1126 09/12/20 0732   09/08/20 1000  vancomycin (VANCOREADY) IVPB 2000 mg/400 mL        2,000 mg 200 mL/hr over 120 Minutes Intravenous  Once 09/08/20 0857 09/08/20 1224   09/08/20 1000  ceFEPIme (MAXIPIME) 2 g in sodium chloride 0.9 % 100 mL IVPB  Status:  Discontinued        2 g 200 mL/hr over 30 Minutes Intravenous Every 12 hours 09/08/20 0857 09/11/20 1126   09/08/20 0856  vancomycin variable dose per unstable renal function (pharmacist dosing)  Status:  Discontinued         Does not apply See admin instructions 09/08/20 0857 09/09/20 0935   09/02/20 1600  cefTRIAXone (ROCEPHIN) 1 g in sodium chloride 0.9 % 100 mL IVPB  Status:  Discontinued        1 g 200 mL/hr over 30 Minutes Intravenous Every 24 hours 09/01/20 1811 09/02/20 0838   09/01/20 1800  fluconazole (DIFLUCAN) IVPB 400 mg        400 mg 50 mL/hr over 240 Minutes Intravenous  Once 09/01/20 1749 09/02/20 0603   09/01/20 1530  piperacillin-tazobactam (ZOSYN) IVPB 3.375 g        3.375 g 12.5 mL/hr over 240 Minutes Intravenous Every 8 hours 09/01/20 1514 09/05/20 2111   09/01/20 1000  levofloxacin (LEVAQUIN) tablet 250 mg  Status:  Discontinued        250 mg Oral Daily 08/31/20 1508 08/31/20 1735   08/31/20 1730  cefTRIAXone (ROCEPHIN) 1 g in sodium chloride 0.9 % 100 mL IVPB  Status:  Discontinued        1 g 200 mL/hr over 30 Minutes Intravenous Every 24 hours 08/31/20 1726 09/01/20 1513   08/31/20 1730  azithromycin (ZITHROMAX) 500 mg in sodium chloride 0.9 % 250 mL IVPB  Status:  Discontinued        500 mg 250 mL/hr over 60 Minutes Intravenous Every 24 hours 08/31/20 1726 09/02/20 0838       Objective: Vitals:   11/29/20 2317 11/30/20 0401 11/30/20 0448 11/30/20 0727  BP: 136/87 110/71    Pulse: (!) 112  (!) 108 (!) 108  Resp: 20 (!) 21 20 18   Temp: 98.2 F (36.8 C) 98.5 F (36.9 C)    TempSrc:  Oral Rectal    SpO2: 100%  100% 100%  Weight:      Height:        Intake/Output Summary (Last 24 hours) at 11/30/2020 0744 Last data filed at 11/30/2020 2876 Gross per 24 hour  Intake 1525 ml  Output 2225 ml  Net -700 ml   Filed Weights   11/16/20 1500 11/16/20 1802 11/16/20 2120  Weight: 86 kg 85 kg 92.6 kg    Examination:   General exam: Very deconditioned, debilitated, chronically ill looking HEENT: Feeding tube, tracheostomy Respiratory system: No obvious  wheezes or crackles  Cardiovascular system: S1 & S2 heard, RRR. No JVD, murmurs, rubs, gallops or clicks.  Temporary dialysis catheter on the right chest Gastrointestinal system: Abdomen is nondistended, soft and nontender.  Dressings, colostomy  Central nervous system: Alert and awake Extremities: No edema, no clubbing ,no cyanosis Skin: Stage IV sacral ulcer  Data Reviewed: I have personally reviewed following labs and imaging studies  CBC: Recent Labs  Lab 11/24/20 0906 11/25/20 0440 11/26/20 0916  WBC 12.5* 15.4* 15.2*  NEUTROABS  --  10.0*  --   HGB 8.9* 10.0* 8.9*  HCT 28.9* 33.4* 29.5*  MCV 88.7 88.4 86.8  PLT 397 386 371*   Basic Metabolic Panel: Recent Labs  Lab 11/24/20 0906 11/25/20 0440 11/26/20 0916 11/27/20 0115 11/28/20 0044  NA 137 135 134* 131* 132*  K 3.8 4.9 4.3 4.6 3.9  CL 99 98 95* 94* 95*  CO2 25 24 25  20* 24  GLUCOSE 101* 133* 105* 229* 224*  BUN 45* 24* 61* 103* 47*  CREATININE 2.15* 1.86* 2.59* 2.78* 1.56*  CALCIUM 10.1 10.6* 10.4* 9.5 9.4  PHOS 7.1*  --  6.6* 7.2*  --    GFR: Estimated Creatinine Clearance: 40.5 mL/min (A) (by C-G formula based on SCr of 1.56 mg/dL (H)). Liver Function Tests: Recent Labs  Lab 11/24/20 0906 11/26/20 0916 11/27/20 0115  ALBUMIN 2.5* 2.6* 2.6*   No results for input(s): LIPASE, AMYLASE in the last 168 hours. No results for input(s): AMMONIA in the last 168 hours. Coagulation Profile: No results for input(s): INR, PROTIME in the  last 168 hours. Cardiac Enzymes: No results for input(s): CKTOTAL, CKMB, CKMBINDEX, TROPONINI in the last 168 hours. BNP (last 3 results) No results for input(s): PROBNP in the last 8760 hours. HbA1C: No results for input(s): HGBA1C in the last 72 hours. CBG: Recent Labs  Lab 11/28/20 2155 11/29/20 0757 11/29/20 1251 11/29/20 1552 11/29/20 1948  GLUCAP 226* 212* 171* 158* 212*   Lipid Profile: No results for input(s): CHOL, HDL, LDLCALC, TRIG, CHOLHDL, LDLDIRECT in the last 72 hours. Thyroid Function Tests: No results for input(s): TSH, T4TOTAL, FREET4, T3FREE, THYROIDAB in the last 72 hours. Anemia Panel: No results for input(s): VITAMINB12, FOLATE, FERRITIN, TIBC, IRON, RETICCTPCT in the last 72 hours. Sepsis Labs: No results for input(s): PROCALCITON, LATICACIDVEN in the last 168 hours.  Recent Results (from the past 240 hour(s))  Culture, respiratory (non-expectorated)     Status: None   Collection Time: 11/22/20  4:45 PM   Specimen: Tracheal Aspirate; Respiratory  Result Value Ref Range Status   Specimen Description TRACHEAL ASPIRATE  Final   Special Requests NONE  Final   Gram Stain   Final    ABUNDANT WBC PRESENT,BOTH PMN AND MONONUCLEAR ABUNDANT GRAM POSITIVE COCCI IN PAIRS Performed at [facility], [place] [address], [place], [place] [address]    Culture MODERATE STAPHYLOCOCCUS AUREUS  Final   Report Status 11/25/2020 FINAL  Final   Organism ID, Bacteria STAPHYLOCOCCUS AUREUS  Final      Susceptibility   Staphylococcus aureus - MIC*    CIPROFLOXACIN <=0.5 SENSITIVE Sensitive     ERYTHROMYCIN <=0.25 SENSITIVE Sensitive     GENTAMICIN <=0.5 SENSITIVE Sensitive     OXACILLIN <=0.25 SENSITIVE Sensitive     TETRACYCLINE <=1 SENSITIVE Sensitive     VANCOMYCIN <=0.5 SENSITIVE Sensitive     TRIMETH/SULFA <=10 SENSITIVE Sensitive     CLINDAMYCIN <=0.25 SENSITIVE Sensitive     RIFAMPIN <=0.5 SENSITIVE Sensitive     Inducible Clindamycin NEGATIVE Sensitive     *  MODERATE STAPHYLOCOCCUS AUREUS         Radiology Studies: IR Fluoro Guide CV Line Right  Result Date: 11/29/2020 INDICATION: 70 year old female with history of end-stage renal disease on hemodialysis status post tunneled hemodialysis catheter placement on 10/19/2020, presenting with  malfunctioning catheter, exposed cuff. EXAM: TUNNELED CENTRAL VENOUS HEMODIALYSIS CATHETER REPLACEMENT WITH FLUOROSCOPIC GUIDANCE MEDICATIONS: None. FLUOROSCOPY TIME:  0 minutes 7 seconds, 4 mGy COMPLICATIONS: None immediate. PROCEDURE: Informed written consent was obtained from the patient after a discussion of the risks, benefits, and alternatives to treatment. Questions regarding the procedure were encouraged and answered. The skin and external portion of the existing hemodialysis catheter was prepped with chlorhexidine in a sterile fashion, and a sterile drape was applied covering the operative field. Maximum barrier sterile technique with sterile gowns and gloves were used for the procedure. A timeout was performed prior to the initiation of the procedure. Both lumens of the hemodialysis catheter were cannulated with a stiff Glidewire and advanced to the level of the right atrium. Under intermittent fluoroscopic guidance, the existing dialysis catheter was exchanged for a new 23 cm tip to cuff palindrome dialysis catheter with tips ultimately terminating within the right atrium. Final catheter positioning was confirmed and documented with a spot radiographic image. The catheter aspirates and flushes normally. The catheter was flushed with appropriate volume heparin dwells. The catheter exit site was secured with 2, 0 silk retention sutures. Both lumens were heparinized. A dressing was placed. The patient tolerated the procedure well without immediate post procedural complication. IMPRESSION: Successful replacement of 23 cm tip to cuff tunneled hemodialysis catheter with tips terminating within the right atrium. The  catheter is ready for immediate use. Ruthann Cancer, MD [facility] Radiology Electronically Signed   By: Ruthann Cancer MD   On: 11/29/2020 11:59        Scheduled Meds: . apixaban  2.5 mg Oral BID  . chlorhexidine gluconate (MEDLINE KIT)  15 mL Mouth Rinse BID  . Chlorhexidine Gluconate Cloth  6 each Topical Daily  . cholecalciferol  2,000 Units Oral Daily  . clonazepam  0.25 mg Oral QHS  . collagenase   Topical BID  . darbepoetin (ARANESP) injection - DIALYSIS  100 mcg Intravenous Q Sat-HD  . feeding supplement (NEPRO CARB STEADY)  1,000 mL Per Tube Q24H  . feeding supplement (PROSource TF)  45 mL Per Tube TID  . guaiFENesin  15 mL Oral Q12H  . insulin aspart  0-5 Units Subcutaneous QHS  . insulin aspart  0-9 Units Subcutaneous TID WC  . mouth rinse  15 mL Mouth Rinse QID  . megestrol  200 mg Oral QID  . metoprolol tartrate  50 mg Oral BID  . midodrine  40 mg Oral TID WC  . multivitamin  1 tablet Oral QHS  . nutrition supplement (JUVEN)  1 packet Oral BID BM  . pantoprazole sodium  40 mg Oral BID  . sodium chloride flush  10-40 mL Intracatheter Q12H   Continuous Infusions: . sodium chloride 5 mL/hr at 11/16/20 0935  . sodium chloride    . albumin human 25 g (11/16/20 1516)  .  ceFAZolin (ANCEF) IV 1 g (11/29/20 2231)     LOS: 90 days    Time spent:35 mins.More than 50% of that time was spent in counseling and/or coordination of care.      Shelly Coss, MD [facility] P2/10/2020, 7:44 AM

## 2020-11-30 NOTE — Progress Notes (Addendum)
  Speech Language Pathology Treatment: Dysphagia;Passy Muir Speaking valve  Patient Details Name: Candace [name] MRN: [id number] DOB: 1951-10-11 Today's Date: 11/30/2020 Time: 0045-9977 SLP Time Calculation (min) (ACUTE ONLY): 30 min  Assessment / Plan / Recommendation Clinical Impression  Pt seen with RN at bedside, SLP assisted with am meal and administering meds. Pt is currently taking most of her meds crushed in puree or in liquid form. Pt is throat clearing quite a bit with liquid meds and the taste is very bad. Requested RN attempt meds whole in puree and if pt is successful encouraged MD and RN to switch meds to pill form. Pts oral phase is adequate with meal, she is requesting upgrade so she can try salad. This is appropriate as long as she is sitting upright. We alternated back and forth between upright and reclined during the session and pt had more frequent throat clearing and coughing with sips of water when reclined. Upright position is limited by pain. Recommend pt continue nectar thick liquids except water when upright. She may have regular solids and attempt meds whole in puree. Her vocal quality is hoarse but her breath support and volume with speech is improving. Pt able to follow commands to increase volume today. No tracheal secretion observed. Pt able to cough up min secretions to mouth with PMSV in place as needed. Will continue efforts.   HPI HPI: 70 year old female who was previously diagnosed with Covid 08/23/2020.  Admitted 11/2 and intubated with AF-RVR, found to have a perforated duodenal underwent exploratory laparotomy with Phillip Heal patch placement 11/3, Intubated for 20 days until 11/23 then trach placed. Prolonged ventilation, until 1/17. Profoundly deconditioned.      SLP Plan  Continue with current plan of care       Recommendations  Diet recommendations: Regular;Thin liquid;Nectar-thick liquid Liquids provided via: Cup;Straw Medication Administration: Whole meds  with puree Supervision: Staff to assist with self feeding;Full supervision/cueing for compensatory strategies Compensations: Slow rate;Small sips/bites Postural Changes and/or Swallow Maneuvers: Seated upright 90 degrees      Patient may use Passy-Muir Speech Valve: During all waking hours (remove during sleep);During PO intake/meals PMSV Supervision: Full MD: Please consider changing trach tube to : Cuffless;Smaller size         Plan: Continue with current plan of care       GO               Candace [place], MA [place] Pager [phone] Office [phone]  Candace [name] 11/30/2020, 10:19 AM

## 2020-11-30 NOTE — Progress Notes (Signed)
Inpatient Diabetes Program Recommendations  AACE/ADA: New Consensus Statement on Inpatient Glycemic Control (2015)  Target Ranges:  Prepandial:   less than 140 mg/dL      Peak postprandial:   less than 180 mg/dL (1-2 hours)      Critically ill patients:  140 - 180 mg/dL   Lab Results  Component Value Date   GLUCAP 333 (H) 11/30/2020   HGBA1C 7.0 (H) 08/24/2020    Review of Glycemic Control Results for Candace [name], Candace [name] (MRN [id number]) as of 11/30/2020 12:28  Ref. Range 11/29/2020 07:57 11/29/2020 12:51 11/29/2020 15:52 11/29/2020 19:48 11/30/2020 08:00  Glucose-Capillary Latest Ref Range: 70 - 99 mg/dL 212 (H) 171 (H) 158 (H) 212 (H) 333 (H)   Current orders for Inpatient glycemic control:  Novolog sensitive tid with meals and HS Nepro 65 ml/hr 6p-6a Inpatient Diabetes Program Recommendations:   Consider adding Novolog correction at MN and 4a while patient is on nocturnal feeds.   Thanks,  Adah Perl, RN, BC-ADM Inpatient Diabetes Coordinator Pager [phone] (8a-5p)

## 2020-11-30 NOTE — Progress Notes (Signed)
Patient needs to be able to sit in a recliner/wheelchair for approximately 5 hours (4 hours for HD tx and 1 hour for transport to and from).  Trach either needs to be decannulated or not require any suctioning (if minimal suctioning is required, patient/family need to understand that no suctioning is allowed in the HD unit due to infection prevention policy).  Patient needs to be interactive enough to acknowledge if she is in distress. Please consult Renal Navigator when above has been achieved or if questions arise.   Alphonzo Cruise, [place] Renal Navigator [phone]

## 2020-11-30 NOTE — TOC Progression Note (Signed)
Transition of Care [facility]) - Progression Note    Patient Details  Name: Candace [name] MRN: [id number] Date of Birth: 1951/09/29  Transition of Care [facility]) CM/SW Contact  [name], RN Phone Number: 11/30/2020, 12:55 PM  Clinical Narrative:    Case management met with the patient and daughter at the bedside to discuss transitions of care to home at a later date once patient is able to go home with home health services and outpatient HD.  I spoke with the daughter, [name] at length and answers questions about discharge planning for home and what that plan would look like and what care will be needed at home.  The daughter states that the patient has been swallowing food and verbalizing her likes/dislikes for food preferences.  Her po intake intake is improving and the daughter and patient are hopeful to be able to discontinue the Cortrak at some point when the patient is taking appropriate diet intake.  The patient was able to cough secretions forcefully through the [facility] trach and daughter was attentive to helping patient cough up secretions while nurse was suctioning at the bedside.  The daughter, [name], was attentive to the patient at the bedside and interested in helping care for her mother as needed with staff present.  The daughter states that she is hopeful that her mom can progress to a #4 Shiley trach soon and states she is hoping that her mom will be able to discharge home without the trach and cortrak at some point to foster patient's care at home and ability to discharge home with home health services and be able to transport to outpatient HD services.  The patient remains in specialty bed with pink/red drainage noted in the wound vac cannister.  The daughter states that the patient is able to turn more readily with assistance from nursing staff. I explained to the daughter that the sacral wound will need dressing changes or wound vac needs for home at a later date depending on  surgery's recommendations for healing.  The daughter was very interested in exchange of communication regarding patient's needs for discharge planning and appreciated the opportunity to discuss patient's care.  The daughter verbalizes understanding of realistic plans and goals for discharge.  The daughter is receptive to outpatient HD follow up if needed for the patient once patient improves medically to be able to discharge to home with home health services and dme.  CM and MSW will continue to follow the patient for transition of care needs.  Expected Discharge Plan: [place] Barriers to Discharge: Continued Medical Work up  Expected Discharge Plan and Services Expected Discharge Plan: [place] In-house Referral: Clinical Social Work Discharge Planning Services: CM Consult Post Acute Care Choice: Durable Medical Equipment,Home Health,Dialysis Living arrangements for the past 2 months: Single Family Home                 DME Arranged: N/A DME Agency: NA       HH Arranged: NA HH Agency: NA         Social Determinants of Health (SDOH) Interventions    Readmission Risk Interventions Readmission Risk Prevention Plan 11/19/2020  Transportation Screening Complete  Medication Review Press photographer) Complete  PCP or Specialist appointment within 3-5 days of discharge Complete  HRI or Home Care Consult Complete  SW Recovery Care/Counseling Consult Complete  Palliative Care Screening Complete  Skilled Nursing Facility Complete  Some recent data might be hidden

## 2020-12-01 DIAGNOSIS — I4891 Unspecified atrial fibrillation: Secondary | ICD-10-CM | POA: Diagnosis not present

## 2020-12-01 LAB — GLUCOSE, CAPILLARY
Glucose-Capillary: 348 mg/dL — ABNORMAL HIGH (ref 70–99)
Glucose-Capillary: 310 mg/dL — ABNORMAL HIGH (ref 70–99)
Glucose-Capillary: 187 mg/dL — ABNORMAL HIGH (ref 70–99)
Glucose-Capillary: 281 mg/dL — ABNORMAL HIGH (ref 70–99)
Glucose-Capillary: 322 mg/dL — ABNORMAL HIGH (ref 70–99)

## 2020-12-01 LAB — BASIC METABOLIC PANEL
Anion gap: 13 (ref 5–15)
BUN: 55 mg/dL — ABNORMAL HIGH (ref 8–23)
CO2: 24 mmol/L (ref 22–32)
Calcium: 9.4 mg/dL (ref 8.9–10.3)
Chloride: 95 mmol/L — ABNORMAL LOW (ref 98–111)
Creatinine, Ser: 1.26 mg/dL — ABNORMAL HIGH (ref 0.44–1.00)
GFR, Estimated: 46 mL/min — ABNORMAL LOW (ref >60)
Glucose, Bld: 354 mg/dL — ABNORMAL HIGH (ref 70–99)
Potassium: 3.5 mmol/L (ref 3.5–5.1)
Sodium: 132 mmol/L — ABNORMAL LOW (ref 135–145)

## 2020-12-01 LAB — HEMOGLOBIN A1C
Hgb A1c MFr Bld: 6.2 % — ABNORMAL HIGH (ref 4.8–5.6)
Mean Plasma Glucose: 131.24 mg/dL

## 2020-12-01 MED ORDER — OXYCODONE HCL 5 MG PO TABS
5.0000 mg | ORAL_TABLET | ORAL | Status: DC | PRN
  Administered 2020-12-04 – 2020-12-05 (×2): 5 mg
  Filled 2020-12-01 (×4): qty 1

## 2020-12-01 MED ORDER — OXYCODONE HCL 5 MG PO TABS
5.0000 mg | ORAL_TABLET | ORAL | Status: DC | PRN
  Administered 2020-12-01 – 2020-12-06 (×9): 5 mg via ORAL
  Filled 2020-12-01 (×8): qty 1

## 2020-12-01 MED ORDER — INSULIN ASPART 100 UNIT/ML ~~LOC~~ SOLN
0.0000 [IU] | SUBCUTANEOUS | Status: DC
  Administered 2020-12-01: 4 [IU] via SUBCUTANEOUS
  Administered 2020-12-01: 3 [IU] via SUBCUTANEOUS
  Administered 2020-12-01: 1 [IU] via SUBCUTANEOUS
  Administered 2020-12-02: 3 [IU] via SUBCUTANEOUS
  Administered 2020-12-02: 4 [IU] via SUBCUTANEOUS

## 2020-12-01 MED ORDER — SODIUM CHLORIDE 0.9 % IV SOLN: INTRAVENOUS | Status: DC

## 2020-12-01 NOTE — Consult Note (Signed)
[facility] Nurse wound follow up Patient was OOB to chair today and sitting on a pressure redistribution chair cushion. Value of cushion is discussed with patient's daughter and pros and cons of alternate cushions is discussed. Anatomy of ischial tuberosity vs sacral pressure points while sitting is discussed. Noted is the fact that patient is able to consume "regular" diet as tolerated and while sitting upright.  Wound type:Pressure. Measurement:14 x 12 x 6cm. Wound is somewhat positional. Wound bed:25% red, 65% yellow or subcutaneous fat/adipose, 10% black.  Note: black total percentage of eschar may be interspersed with residual silver nitrate from last treatment. Drainage (amount, consistency, odor) moderate serous. Difficult to ascertain as patient is undergoing VAC Veraflo therapy. Periwound:Intact Dressing procedure/placement/frequency: Instillation therapy used to loosen dressing for 3 minutes prior to removal.  3 pieces of foam removed: 1 honeycomb, 1 silver foam and one foam for T.R.A.C. pad attachment/bridge to right hip.Post treatment and debridement (CSWD), dressing is performed using several layers of V.A.C. drape to protect the periwound skin. One (1) piece of honeycomb VeraFlo foam, one piece of grey foam and one piece of foam for bridging to right hip with mushroom for T.R.A.C. pad. Conservative sharp wound debridement (CSWD performed at the bedside): By DPT C. Owens Shark. Assistance and expertise of PT. Kendrick [place] is appreciated. Patient tolerated procedure well with minimal bleeding that stops easily with pressure.Marland Kitchen    Next dressing change is Friday, 2/4.  VeraFlo to continue until Monday, 2/7 at which time team will discuss continuing with VeraFlo or changing therapy to standard NPWT.  Door nursing team will follow seeing every 7-10 days with PT/hydrotherapy, and will remain available to this patient, the nursing, surgical and medical teams.   Thanks, Maudie Flakes, MSN, RN, [place], Arther Abbott  Pager# [phone]

## 2020-12-01 NOTE — Progress Notes (Signed)
Inpatient Diabetes Program Recommendations  AACE/ADA: New Consensus Statement on Inpatient Glycemic Control (2015)  Target Ranges:  Prepandial:   less than 140 mg/dL      Peak postprandial:   less than 180 mg/dL (1-2 hours)      Critically ill patients:  140 - 180 mg/dL   Lab Results  Component Value Date   GLUCAP 348 (H) 12/01/2020   HGBA1C 7.0 (H) 08/24/2020    Review of Glycemic Control Results for Candace [name], Candace [name] (MRN [id number]) as of 12/01/2020 08:57  Ref. Range 11/30/2020 17:34 11/30/2020 21:01 12/01/2020 08:00  Glucose-Capillary Latest Ref Range: 70 - 99 mg/dL 129 (H) 263 (H) 348 (H)   Current orders for Inpatient glycemic control:  Novolog sensitive tid with meals and HS Nepro 65 ml/hr 6p-6a Inpatient Diabetes Program Recommendations:    Glucose trends up above goal related to nocturnal tube feeds. Consider adding Novolog correction at MN and 4a while patient is on nocturnal feeds.   Thanks, Bronson Curb, MSN, RNC-OB Diabetes Coordinator [phone] (8a-5p)

## 2020-12-01 NOTE — Progress Notes (Signed)
Physical Therapy Wound Treatment Patient Details  Name: Candace [name] MRN: [id number] Date of Birth: 1951-01-02  Today's Date: 12/01/2020 Time: 1771-1657 Time Calculation (min): 58 min  Subjective  Subjective: Pt/pt daughter would like her premedicated 1.5 hour before treatment Patient and Family Stated Goals: heal wound Date of Onset:  (unknown) Prior Treatments: prior hydro and Dakins  Pain Score: 2/10  Wound Assessment  Pressure Injury 09/25/20 Sacrum Bilateral;Medial Deep Tissue Pressure Injury - Purple or maroon localized area of discolored intact skin or blood-filled blister due to damage of underlying soft tissue from pressure and/or shear. Purple, non-blanchable, b (Active)  Wound Image   11/29/20 1702  Dressing Type Negative pressure wound therapy;Barrier Film (skin prep) 12/01/20 1722  Dressing Changed 12/01/20 1722  Dressing Change Frequency Every 3 days 12/01/20 1722  State of Healing Early/partial granulation 12/01/20 1722  Site / Wound Assessment Red;Yellow;Black 12/01/20 1722  % Wound base Red or Granulating 25% 12/01/20 1722  % Wound base Yellow/Fibrinous Exudate 65% 12/01/20 1722  % Wound base Black/Eschar 10% 12/01/20 1722  % Wound base Other/Granulation Tissue (Comment) 0% 12/01/20 1722  Peri-wound Assessment Intact 12/01/20 1722  Wound Length (cm) 14 cm 12/01/20 1722  Wound Width (cm) 12 cm 12/01/20 1722  Wound Depth (cm) 6 cm 12/01/20 1722  Wound Surface Area (cm^2) 168 cm^2 12/01/20 1722  Wound Volume (cm^3) 1008 cm^3 12/01/20 1722  Tunneling (cm) 0 12/01/20 1722  Undermining (cm) 0 12/01/20 1722  Margins Unattached edges (unapproximated) 12/01/20 1722  Drainage Amount Minimal 12/01/20 1722  Drainage Description Sanguineous 12/01/20 1722  Treatment Debridement (Selective);Negative pressure wound therapy 12/01/20 1722      Hydrotherapy Pulsed lavage therapy - wound location: sacrum Pulsed Lavage with Suction (psi): 12 psi Pulsed Lavage with Suction -  Normal Saline Used: 1000 mL Pulsed Lavage Tip: Tip with splash shield Selective Debridement Selective Debridement - Location: sacrum Selective Debridement - Tools Used: Forceps;Scalpel Selective Debridement - Tissue Removed: necrotic adipose, eschar   Wound Assessment and Plan  Wound Therapy - Assess/Plan/Recommendations Wound Therapy - Clinical Statement: Wound demonstrates incremental progress with VAC. Able to debride significant amount of yellow slough today with improved pain control from pt (pain medication given 1.5 hours prior). Continue plan. Wound Therapy - Functional Problem List: Global weakness and immobility Factors Delaying/Impairing Wound Healing: Diabetes Mellitus;Immobility;Multiple medical problems;Other (comment) (poor nutrition) Hydrotherapy Plan: Debridement;Pulsatile lavage with suction;Patient/family education;Dressing change Wound Therapy - Frequency: 6X / week Wound Therapy - Follow Up Recommendations: Skilled nursing facility Wound Plan: see above  Wound Therapy Goals- Improve the function of patient's integumentary system by progressing the wound(s) through the phases of wound healing (inflammation - proliferation - remodeling) by: Decrease Necrotic Tissue to: 50% Decrease Necrotic Tissue - Progress: Progressing toward goal Increase Granulation Tissue to: 50% Increase Granulation Tissue - Progress: Progressing toward goal Goals/treatment plan/discharge plan were made with and agreed upon by patient/family: Yes Time For Goal Achievement: 7 days Wound Therapy - Potential for Goals: Fair  Goals will be updated until maximal potential achieved or discharge criteria met.  Discharge criteria: when goals achieved, discharge from hospital, MD decision/surgical intervention, no progress towards goals, refusal/missing three consecutive treatments without notification or medical reason.  GP     [name], PT, DPT Acute Rehabilitation Services Pager  [phone] Office [phone]  [name] 12/01/2020, 5:27 PM

## 2020-12-01 NOTE — Progress Notes (Signed)
Orthopedic Tech Progress Note Patient Details:  Candace [name] May 04, 1951 [id number] Called in order to [employer] for BLE PRAFO BOOTS Patient ID: Candace [name], female   DOB: 1951/06/07, 70 y.o.   MRN: [id number]   Janit Pagan 12/01/2020, 11:17 AM

## 2020-12-01 NOTE — Progress Notes (Addendum)
Inpatient Rehab Admissions Coordinator Note:   Per therapy recommendations, pt was screened for CIR candidacy by Clemens Catholic, [place] CCC-SLP. I have asked rehab MD to review this case.   Clemens Catholic, [place], [place] Admissions Coordinator  [phone] ([place]) [phone] (office)

## 2020-12-01 NOTE — Progress Notes (Signed)
Occupational Therapy Treatment Patient Details Name: Candace [name] MRN: [id number] DOB: Jul 23, 1951 Today's Date: 12/01/2020    History of present illness 70 y.o. female with medical history significant for recent covid pna, obesity, colon cancer s/p colon resection with colostomy bag, HLD, NIDDM2, CKD3 presented to ED after her follow-up nurse advised her to present to ED for elevated HR. +afib, elevated troponins with demand ischemia,  CT scan is positive for bowel perforation with pneumomediastinum 11/03 exp lap with repair of perforated ulcer. 11/10 t/f to ICU- intubated/sedated/proned. Trach placed 11/24. CRRT off 12/8, restarted 12/10-1/5.   OT comments  Pt seen in conjunction with PT in first attempt for transfer OOB to begin progressing sitting tolerance for outpatient dialysis. Daughter present and assisting throughout with education on maximove use, placement of lift pad, positioning and skin integrity strategies (including air cushion for wc and air mattress for bed at home). Family confirms they wish to be able to take pt home and provide 24/7 care. Pt overall Mod A x 2 for rolling side to side in bed and lifted to chair via maximove. Encouraged pt to sit within tolerance but goal of at least 45 minutes today. Pt repositioned in chair for comfort and BP WFL. Pt also noted with improving B hand grasp - plan to assess and progress further to improve ability to complete simple UB ADLs.    Follow Up Recommendations  LTACH;SNF;Supervision/Assistance - 24 hour;Other (comment) (Max HH services if continues to decline SNF/LTACH)    Financial risk analyst (measurements OT);Wheelchair cushion (measurements OT);Hospital bed;Other (comment) (tilt in space power chair; air cushion and mattress for skin integrity)    Recommendations for Other Services      Precautions / Restrictions Precautions Precautions: Fall Precaution Comments: baseline colostomy; sacral wound with wound vac,  trach with PMV Restrictions Weight Bearing Restrictions: No       Mobility Bed Mobility Overal bed mobility: Needs Assistance Bed Mobility: Rolling Rolling: Mod assist;+2 for physical assistance;+2 for safety/equipment         General bed mobility comments: Mod A x 2 for rolling side to side for placement of maximove pad  Transfers Overall transfer level: Needs assistance Equipment used: Ambulation equipment used             General transfer comment: Use of maximove for transfer to recliner chair to improve sitting tolerance for dialysis. Daughter present and assisting throughout.    Balance Overall balance assessment: Needs assistance                                         ADL either performed or assessed with clinical judgement   ADL Overall ADL's : Needs assistance/impaired   Eating/Feeding Details (indicate cue type and reason): Pt daughter present and asking about U-cuff. Educated on how it fits on pt hand and where utensil goes. With improving grasp, educated on possible trial of built up foam grips for utensils as appropriate                 Lower Body Dressing: Total assistance;Bed level Lower Body Dressing Details (indicate cue type and reason): Daughter assisting pt in donning socks bed level               General ADL Comments: Pt requires extensive assist for ADLs bed level though improving hand grasp and strength. Pt's daughter reports being able to assist pt  in writing as pt likes to journal (encouraged use of built up grip pens to increase grasp efficiency).     Vision   Vision Assessment?: No apparent visual deficits   Perception     Praxis      Cognition Arousal/Alertness: Awake/alert Behavior During Therapy: WFL for tasks assessed/performed Overall Cognitive Status: Impaired/Different from baseline Area of Impairment: Attention;Memory;Following commands;Safety/judgement;Awareness;Problem solving                    Current Attention Level: Selective Memory: Decreased short-term memory Following Commands: Follows one step commands with increased time;Follows one step commands consistently Safety/Judgement: Decreased awareness of safety;Decreased awareness of deficits Awareness: Intellectual Problem Solving: Slow processing;Decreased initiation;Difficulty sequencing;Requires verbal cues;Requires tactile cues General Comments: Pt alert, mouthing words around trach in hushed tones.  Follows commands consistently and with increased time        Exercises     Shoulder Instructions       General Comments VSS on RA. BP WFL once sititng up in chair. Repositioned with pillows, placed geomats in chair to improve positioning. Pt wanted to have feet down, propped with blankets under B feet to improve ankle function. NP and CM entering during session and assisting in repositioning. Educated to float heels if footrest placed up. Daughter reports pt does not like wearing prevalon boots - PT confirmed with NP present ok to try Prafo boots to maintain proper joint alignment    Pertinent Vitals/ Pain       Pain Assessment: Faces Faces Pain Scale: Hurts little more Pain Location: bottom and B feet with certain movements Pain Descriptors / Indicators: Grimacing Pain Intervention(s): Monitored during session;Repositioned;Patient requesting pain meds-RN notified;Limited activity within patient's tolerance  Home Living                                          Prior Functioning/Environment              Frequency  Min 2X/week        Progress Toward Goals  OT Goals(current goals can now be found in the care plan section)  Progress towards OT goals: Progressing toward goals  Acute Rehab OT Goals Patient Stated Goal: be able to write, be able to be more independent OT Goal Formulation: With patient/family Time For Goal Achievement: 12/10/20 Potential to Achieve Goals: Fair ADL  Goals Pt Will Perform Grooming: with mod assist;bed level;sitting Pt Will Perform Upper Body Bathing: with max assist;sitting;bed level Pt Will Perform Upper Body Dressing: with set-up;with supervision;sitting Pt Will Transfer to Toilet: with max assist;with +2 assist;squat pivot transfer;bedside commode Pt Will Perform Toileting - Clothing Manipulation and hygiene: with min guard assist;sitting/lateral leans;sit to/from stand Pt Will Perform Tub/Shower Transfer: with supervision;ambulating;shower seat;rolling walker Pt/caregiver will Perform Home Exercise Program: Increased strength;Both right and left upper extremity;With written HEP provided;With theraband Additional ADL Goal #1: Pt will sustain attention to ADL task with min VCs Additional ADL Goal #2: Pt will sit EOB for 5 mins with mod A for trunk support with VSS Additional ADL Goal #3: Pt will complete bed mobility with mod A as precursor to ADLs  Plan Discharge plan remains appropriate    Co-evaluation    PT/OT/SLP Co-Evaluation/Treatment: Yes Reason for Co-Treatment: Complexity of the patient's impairments (multi-system involvement);For patient/therapist safety;To address functional/ADL transfers   OT goals addressed during session: ADL's and self-care;Strengthening/ROM  AM-PAC OT "6 Clicks" Daily Activity     Outcome Measure   Help from another person eating meals?: A Lot Help from another person taking care of personal grooming?: A Lot Help from another person toileting, which includes using toliet, bedpan, or urinal?: Total Help from another person bathing (including washing, rinsing, drying)?: Total Help from another person to put on and taking off regular upper body clothing?: Total Help from another person to put on and taking off regular lower body clothing?: Total 6 Click Score: 8    End of Session Equipment Utilized During Treatment: Other (comment) (maximove)  OT Visit Diagnosis: Unsteadiness on feet  (R26.81);Other abnormalities of gait and mobility (R26.89);Muscle weakness (generalized) (M62.81);Other symptoms and signs involving cognitive function   Activity Tolerance Patient tolerated treatment well   Patient Left in chair;with call bell/phone within reach;with family/visitor present   Nurse Communication Mobility status        Time: 3016-0109 OT Time Calculation (min): 69 min  Charges: OT General Charges $OT Visit: 1 Visit OT Treatments $Self Care/Home Management : 8-22 mins $Therapeutic Activity: 23-37 mins  [name], OTR/L   [name] 12/01/2020, 2:03 PM

## 2020-12-01 NOTE — Plan of Care (Signed)
  Problem: Education: Goal: Utilization of techniques to improve thought processes will improve Outcome: Progressing Goal: Knowledge of the prescribed therapeutic regimen will improve Outcome: Progressing   

## 2020-12-01 NOTE — Progress Notes (Addendum)
[facility] Progress Note     Assessment:   1. HD dep't AKI.CRRT stopped 11/03/2020. Family desired ongoing full scope of care, Now on IHD- last done 2/2-  Tolerating poorly- removed only 350 with HD yest but interestingly does not seem that volume overloaded.  There is virtually no way that she will be stable enough for OP dialysis any time soon.  Her only option is LTAC right now-  dont know of [facility].  Right now have her on a TTS schedule - next due tomorrow - pre dialysis crt was 1.7 but BUN was 107-  No prednisone- on nepro- maybe is a little dry- will give some fluid back  2. H/o mixed shock: hemorrhagic and septic shock; stable 3. Anemia;  on ESA- will check iron stores again  4. Perforated duodenal ulcer s/p exlap and graham patch placement 5. AHRF 2/2 ARDS s/p trach, per ccm 6. ARDS 2/2 COVID-19 Pneumonia-  trach 7. A.fib with RVR, recurrent this AM 8. Severe calorie malnutrition- huge decub 9. H/o stage IV colon cancer 10. Hypercalcemia- immobility, stable    - Complex dispo planning noted.  Dr. Johnney Ou discussed with daughter that home dialysis isn't an option right out of hospital and the most appropriate dc would be LTACH or SNF but she is not willing to entertain those as options. She is not opposed to outpt dialysis clinic, just [facility] or SNF.  Addendum... was told the pts daughter wanted to talk to me , in actuality she wanted to talk at me- telling me that she was not concerned about the trach- it would be out soon and that she would make certain that the patient would be able to sit up-  Knows that she and the other family members will be able to handle all of her needs at home so their plan is to go home and actually she thinks that her renal function will recover  Subjective:    HD yest-  Removed around 350- looks like soft BP. Pt alert, no complaints - is making urine   Objective:   BP (!) 141/94   Pulse (!) 124   Temp 98.3 F (36.8  C) (Oral)   Resp 20   Ht 5' 8"  (1.727 m)   Wt 92.6 kg   SpO2 100%   BMI 31.04 kg/m   Intake/Output Summary (Last 24 hours) at 12/01/2020 1610 Last data filed at 11/30/2020 2203 Gross per 24 hour  Intake 687 ml  Output 1844 ml  Net -1157 ml   Weight change:   Physical Exam: Gen: Chronically ill looking female lying on bed -  Is alert, nods indicating that she is OK HEENT: trach in place CVS: tachy, S1-S2 normal Resp: inc RR, trach; scattered ant rhonchi, no rales Abd: soft, nontender Ext:  min edema appreciated  ACCESS: R IJ TDC   Imaging: IR Fluoro Guide CV Line Right  Result Date: 11/29/2020 INDICATION: 70 year old female with history of end-stage renal disease on hemodialysis status post tunneled hemodialysis catheter placement on 10/19/2020, presenting with malfunctioning catheter, exposed cuff. EXAM: TUNNELED CENTRAL VENOUS HEMODIALYSIS CATHETER REPLACEMENT WITH FLUOROSCOPIC GUIDANCE MEDICATIONS: None. FLUOROSCOPY TIME:  0 minutes 7 seconds, 4 mGy COMPLICATIONS: None immediate. PROCEDURE: Informed written consent was obtained from the patient after a discussion of the risks, benefits, and alternatives to treatment. Questions regarding the procedure were encouraged and answered. The skin and external portion of the existing hemodialysis catheter was prepped with chlorhexidine in a sterile fashion, and a sterile  drape was applied covering the operative field. Maximum barrier sterile technique with sterile gowns and gloves were used for the procedure. A timeout was performed prior to the initiation of the procedure. Both lumens of the hemodialysis catheter were cannulated with a stiff Glidewire and advanced to the level of the right atrium. Under intermittent fluoroscopic guidance, the existing dialysis catheter was exchanged for a new 23 cm tip to cuff palindrome dialysis catheter with tips ultimately terminating within the right atrium. Final catheter positioning was confirmed and  documented with a spot radiographic image. The catheter aspirates and flushes normally. The catheter was flushed with appropriate volume heparin dwells. The catheter exit site was secured with 2, 0 silk retention sutures. Both lumens were heparinized. A dressing was placed. The patient tolerated the procedure well without immediate post procedural complication. IMPRESSION: Successful replacement of 23 cm tip to cuff tunneled hemodialysis catheter with tips terminating within the right atrium. The catheter is ready for immediate use. Ruthann Cancer, MD [facility] Electronically Signed   By: Ruthann Cancer MD   On: 11/29/2020 11:59    Labs: BMET Recent Labs  Lab 11/24/20 3893 11/25/20 0440 11/26/20 7342 11/27/20 0115 11/28/20 0044 11/30/20 1248  NA 137 135 134* 131* 132* 131*  K 3.8 4.9 4.3 4.6 3.9 3.4*  CL 99 98 95* 94* 95* 96*  CO2 25 24 25  20* 24 23  GLUCOSE 101* 133* 105* 229* 224* 294*  BUN 45* 24* 61* 103* 47* 108*  CREATININE 2.15* 1.86* 2.59* 2.78* 1.56* 1.70*  CALCIUM 10.1 10.6* 10.4* 9.5 9.4 10.0  PHOS 7.1*  --  6.6* 7.2*  --  4.5   CBC Recent Labs  Lab 11/24/20 0906 11/25/20 0440 11/26/20 0916 11/30/20 1248  WBC 12.5* 15.4* 15.2* 16.4*  NEUTROABS  --  10.0*  --   --   HGB 8.9* 10.0* 8.9* 8.3*  HCT 28.9* 33.4* 29.5* 27.2*  MCV 88.7 88.4 86.8 85.5  PLT 397 386 410* 369    Medications:    . apixaban  2.5 mg Oral BID  . chlorhexidine gluconate (MEDLINE KIT)  15 mL Mouth Rinse BID  . Chlorhexidine Gluconate Cloth  6 each Topical Daily  . cholecalciferol  2,000 Units Oral Daily  . clonazepam  0.25 mg Oral QHS  . collagenase   Topical BID  . darbepoetin (ARANESP) injection - DIALYSIS  100 mcg Intravenous Q Sat-HD  . feeding supplement (NEPRO CARB STEADY)  1,000 mL Per Tube Q24H  . feeding supplement (PROSource TF)  45 mL Per Tube TID  . guaiFENesin  15 mL Oral Q12H  . insulin aspart  0-5 Units Subcutaneous  QHS  . insulin aspart  0-9 Units Subcutaneous TID WC  . mouth rinse  15 mL Mouth Rinse QID  . megestrol  200 mg Oral QID  . metoprolol tartrate  50 mg Oral BID  . midodrine  40 mg Oral TID WC  . multivitamin  1 tablet Oral QHS  . nutrition supplement (JUVEN)  1 packet Oral BID BM  . pantoprazole sodium  40 mg Oral BID  . sodium chloride flush  10-40 mL Intracatheter Q12H      Louis Meckel, MD 12/01/2020, 8:43 AM

## 2020-12-01 NOTE — Progress Notes (Addendum)
[facility] PROGRESS NOTE  Candace [name] WNU:[id number] DOB: 02-Jan-1951 DOA: 08/31/2020 PCP: Algis Greenhouse, MD            Status: Remains inpatient appropriate because:Ongoing diagnostic testing needed not appropriate for outpatient work up, Unsafe d/c plan, IV treatments appropriate due to intensity of illness or inability to take PO and Inpatient level of care appropriate due to severity of illness   Dispo: The patient is from: Home              Anticipated d/c is to: Home with home health               Anticipated d/c date is: > 3 days              Patient currently is not medically stable to d/c.  Barriers to discharge: Continues to require HD- unable to sit in chair/get in [facility] for OP HD-also requiring Cortrak for nocturnal feeds; still has trach with copiuous secretions at times  Considerations for home discharge: 1. Patient will need to continue wound VAC and current wound VAC is apparently not available in the home setting 2. It is likely she will need a specialty bed.  We are currently transitioning back to the [place] rotational bed which will allow for better in bed mobility including the ability to "stand" with PT in the bed without actually getting patient out of bed 3. Likely would need to be decannulated to minimize other care issues after discharge 4. Would need to be able to either mobilize for outpatient hemodialysis or be a candidate for home dialysis 5. Would not have aggressive PT OT available in the home setting that she can achieve during hospitalization 6. Patient will have family members that are CNAs managing her after discharge but this would be 24/7 requirement and this could be an overwhelming issue once patient is actually home  Code Status: Full Family Communication: 2/2 daughter Candace [name] at bedside DVT prophylaxis: Eliquis Vaccination status: Has not been vaccinated but did have severe COVID infection initially diagnosed on 08/23/2020.  Patient will be  eligible for COVID-vaccine after 11/23/2020   Foley catheter: No, purewick female urinary collection device   HPI: 70 year old female patient with prior history of diabetes, stage III chronic kidney disease, atrial fibrillation, hypertension, dyslipidemia and colon cancer that is post colectomy and colostomy.  Initially diagnosed with COVID on 08/23/20 she was admitted to the hospital due to dehydration, acute kidney injury and hypoxemic respiratory failure.  She was discharged home on 2 to 3 L of oxygen after being treated with IV steroids and remdesivir.  During that time although she had elevated inflammatory markers she had no embolic or thrombotic disease.  She had been on prophylactic dose heparin during the hospitalization and was placed on Eliquis for 2 weeks for after discharge  Patient returned to the ER on 08/31/2020 for complaints of not feeling well.  She was found to be in atrial fibrillation with RVR, hemorrhagic shock and work-up revealed a perforated duodenal ulcer.  She underwent exploratory laparotomy on 11/3.  She has had an extensive ICU stay with significant events as below.   11/2 Admitted with rapid Afib 11/3 OR with findings of perforated duodenal ulcer 11/10 progressive hemorrhagic shock, intubated, transfused, pressors, proned; started on CRRT in PM 11/16 Extubated. Re-intubated overnight due to respiratory distress and hypoxia with decreased mentation 11/18 Bronch, cultures sent 11/19 Hgb down getting blood 11/24 Spiked fever, resume empirical antimicrobial therapy 11/26 Hemorrhagic shock, hgb  5.6, increased pressors,  11/30 Per palliative "[name] understanding that patient is unfortunately very fragiledespite ongoing intensive medical treatment and full mechanical support. [name] that the familywantsto continue with all current interventions despite potential outcomes". 12/08 CRRT discontinued due to clotting 12/09 Family requested transfer to  tertiary care [facility]). [facility] denied transfer  12/10 CRRT restarted. Episodes of tachycardia, tachypnea that seem to improve with pain management 12/11 Back in shock. Pressor requirements up. CXR worse. ABX resumed 12/12 Still requiring inc pressors. Had hypoglycemic event. Basal insulin dosing adjusted 12/13 Pressor requirements better. Now hyperglycemic. Re-adjusted Glycemic control  12/19 Afebrile . Remains on dilaudid and heparin gtt, dilaudid gtt increased overnight for concern of pain / ongoing tachycardia, no other events . NE and precedex off 12/17. Ongoing CRRT- even UF, dosing lokelmia this morning 12/20 On CRRT. Renal plans for HD cath and moving to HD. Getting wound care 12/24 - renal stopping CRRT today and plans HD 10/24/20 . 40% fio2 on vent via Trach. TAchypenic and tachycardic. Afebrile but wbc up to 27.6K. On TF. On heparin gtt 12/25 - Back on CRRT. On vent via trach at 40% fio2, On scheduled dilaudid as add on to oxy. Per family request 12/24 - they felt scheduled oxy was not adequate and patient was showing signs of opioid withdrawal.  Patient also had worsening SIRS/sepsis syndrome. Had fever, rising wbc, worsening RR 40-60 and HR 140s sinus->started On abx yesterday. Fever some better today. WBC plateau at 28,.5K. On new levophed ->signifanct improvement in HR 77 and RR t0 20. On heparin gtt. On precedex gtt. On levophed gtt 73mg wthi midodrine. On TF 12/30 Remains on CRRT with intermittent pressor requirements. Ethics consult placed 12/29. Ethics rec time trial of CRRT 12/31 failed SBT with RR 40s. Several conversations between care tam and family, who are upset RE plan of care 1/1 back on pressors  1/4: On pressors, keeping even on CVVHD, HGB drop to 6.9, transfused 1 unit. Improving mental status 1/10 remains on low dose levophed 231m 1/11 off levo, attempting HD with UF for first time. Now tolerating intermittent HD. 1/19 patient transferred to progressive bed with  tracheostomy on 35% FiO2.  has NG tube feeding.  No PEG tube.  Received dialysis on 1/18. 1/24 significant improvement and persistent tachycardia after introduction of twice daily beta-blocker.  Heart rates have improved from the 130s to the 98-104 range 1/24 core track clogged and therefore has been removed by nutrition team.  Calorie count in progress and we are weaning any sedating medications which could be contributing to patient's inability to eat. 1/27 continue w/ cuff #6 trach   Subjective: PT/OT at bedside as his daughter.  Patient has been gotten up to the chair using a lift.  Doing well without any complaints.  Smiling.    Objective: Vitals:   12/01/20 0459 12/01/20 0730  BP:    Pulse: (!) 107 (!) 121  Resp: (!) 21 20  Temp:    SpO2: 100% 100%    Intake/Output Summary (Last 24 hours) at 12/01/2020 082725ast data filed at 11/30/2020 2203 Gross per 24 hour  Intake 687 ml  Output 1844 ml  Net -1157 ml   Filed Weights    Exam: Constitutional: Alert, calm, no acute distress-denies pain Respiratory: Bilateral lung sounds clear.  No increased work of breathing while up in chair.  FiO2 21%. #6 cuffed trach without tracheal secretions to take Cardiovascular: Normal heart sounds S1-S2, no tachycardia noting heart rate 96.  Extremities warm  to touch Abdomen: On D2 diet with nectar thick liquids- LBM 2/1-core track tube for nocturnal tube feedings Skin:  Massive sacral decubitus ulcer-wound VAC now in place-no other lesions Neurologic: CN 2-12 grossly intact. Sensation intact, DTR normal. Strength 1/5 x all 4 extremities.  Psychiatric: Awake, somewhat flat affect, oriented x3   Assessment/Plan: Acute problems: PAF maintaining sinus rhythm w/persistent tachycardia -Continue metoprolol -persistent sinus tachycardia secondary to deconditioning that worsens with activity and hemodialysis -Cardiology has seen  -Echocardiogram this admission with an EF 50 to 55% with mild diastolic  dysfunction parameters and normal RV systolic function -Continue Eliquis-benefits coordinator has determined that Eliquis is covered at [facility] [place] with a co-pay of $47  Acute respiratory failure secondary to COVID-pneumonia/tracheostomy/acute MSSA tracheobronchitis -Patient stable on low-flow oxygen FiO2 21% -PCCM/trach team following -Repeat respiratory culture on 1/24 with abundant staph with sensitivities pending -Continue IV Ancef for MSSA tracheobronchitis -Continue  suctioning and chest PT-Vibra vest -Continue PMV training   Acute kidney injury secondary to COVID-related sepsis with shock superimposed on stage III chronic kidney disease -Continue intermittent hemodialysis as directed by the renal team -Avoid nephrotoxic medications -Continue Aranesp on dialysis days  Dysphagia/moderate to severe protein calorie malnutrition Nutrition Status: Nutrition Problem: Increased nutrient needs Etiology: wound healing Signs/Symptoms: estimated needs Interventions: Refer to RD note for recommendations  Estimated body mass index is 31.04 kg/m as calculated from the following:   Height as of this encounter: 5' 8"  (1.727 m).   Weight as of this encounter: 92.6 kg.  -Continue D2 diet/nectar thick liquids with nocturnal tube feeding - If enteral access needed more long-term surgery recommended contacting IR first and if they are unable to proceed with gastrostomy tube contact surgical team. -1/27 initiated Megace for appetite stimulant.  This medication can precipitate thrombotic events but patient is fully anticoagulated with Eliquis.  Acute hemorrhagic shock secondary to perforated duodenal ulcer/anemia of critical illness -Stable postoperatively -Has tolerated tube feedings and oral diet without any evidence of postoperative obstructive issues which can be common after duodenal oversewing with subsequent scar tissue formation -Hemoglobin remains stable greater than 8.0  Diabetes  mellitus 2 -cont SSI but change to every 4 hours short-term noting patient has increased oral intake in addition to nocturnal tube feedings causing higher ranges in CBG readings-CBGs have ranged between 129 and 348 -HgbA1c 08/24/2020 and was 7.0 -Prior to admission patient was on metformin XR  Profound physical deconditioning/acute encephalopathy secondary to prolonged hospital stay/orthostasis -SNF recommended but family wants to take patient home -At this juncture patient unable to sit up in bed independently much less stand pivot and rotate to get into wheelchair and would be significant fall risk and care risk at home -In addition patient would need to be able to get in wheelchair to transport to dialysis if dialysis were to be continued in the outpatient setting -family refuses LTAC or SNF rehab due to pt prior request to NEVER be placed in any type of facility -Continue high-dose midodrine 3 times daily -Persistent tachycardia secondary to severe deconditioning has improved with the introduction of beta-blockers -Continue PROM every 4 hours-patient encouraged to perform independent AROM as well -2/2: Successful out of bed to chair with initial short-term plan for 45 minutes to 1 hour today -FAMILY THAT WILL PROVIDE CARE FOR PT WILL NEED TO ASSIST WITH CARE OF THIS PT AT [place]  Acute encephalopathy with ICU delirium -Delirium has resolved -Seroquel taper initiated this week.  Have decreased to 25  mg HS the next 3 nights then will discontinue -Continue Klonopin 0.25 mg BID for 3 more days and discontinue -Duragesic taper dose decreased as above  Stage IV sacral decubitus -Not present on admission -Wound care nurse following and utilizing Santyl and hydrotherapy and now has wound VAC in place -Surgical team consulted.  Current recommendation is against pursuing general anesthesia to undergo extensive surgical procedure-hopefully can be reevaluated in the future if  wound does not heal adequately.  As of 1/28 they have nothing further acutely to add but will be following peripherally. [place] rotational bed  -Discontinued Duragesic patch.  Continue low-dose oxycodone for pain  -Currently bedside to answer medication questions that daughter had.  Patient's son also on speaker phone.  Great length regarding pharmacology of narcotic pain medications at this juncture daughter is agreeable to continuing OxyIR including request to have this medication given at least 1 to 1-1/2 hours prior to wound care.  She is also agreeable to utilizing as needed IV morphine with wound care only if oral premed with OxyIR not adequate.  I have updated patient's bedside LPN and I will also update physical therapy team.  -    11/24/2020 after [facility]      11/22/2020                       11/29/2020  Incision (Closed) 09/01/20 Abdomen (Active)  Date First Assessed/Time First Assessed: 09/01/20 1737   Location: Abdomen    Assessments 09/01/2020  6:25 PM 11/29/2020  8:01 AM  Dressing Type Gauze (Comment) Abdominal pads;Gauze (Comment)  Dressing -- Reinforced  Dressing Change Frequency -- Daily  Site / Wound Assessment Dressing in place / Unable to assess Clean;Dry  Margins -- Unattached edges (unapproximated)  Drainage Amount None --     No Linked orders to display     Pressure Injury 09/17/20 Ear Left;Anterior;Posterior Stage 2 -  Partial thickness loss of dermis presenting as a shallow open injury with a red, pink wound bed without slough. (Active)  Date First Assessed/Time First Assessed: 09/17/20 0800   Location: Ear  Location Orientation: Left;Anterior;Posterior  Staging: Stage 2 -  Partial thickness loss of dermis presenting as a shallow open injury with a red, pink wound bed without slough. ...    Assessments 09/17/2020  8:00 AM 11/29/2020  8:01 AM  Dressing Type Foam - Lift dressing to assess site every shift None  Dressing Clean;Dry;Intact --  Dressing Change Frequency Every 3  days --  State of Healing -- Scar  Site / Wound Assessment Dry;Pink --  Peri-wound Assessment Intact --  Wound Length (cm) 2 cm --  Wound Width (cm) 1 cm --  Wound Depth (cm) 0.25 cm --  Wound Surface Area (cm^2) 2 cm^2 --  Wound Volume (cm^3) 0.5 cm^3 --  Margins Unattached edges (unapproximated) --  Drainage Amount Scant --  Drainage Description Serosanguineous --  Treatment Cleansed --     No Linked orders to display     Pressure Injury 09/25/20 Sacrum Bilateral;Medial Deep Tissue Pressure Injury - Purple or maroon localized area of discolored intact skin or blood-filled blister due to damage of underlying soft tissue from pressure and/or shear. Purple, non-blanchable, b (Active)  Date First Assessed/Time First Assessed: 09/25/20 2000   Location: Sacrum  Location Orientation: Bilateral;Medial  Staging: Deep Tissue Pressure Injury - Purple or maroon localized area of discolored intact skin or blood-filled blister due to damage o...    Assessments 09/25/2020  5:00 PM  11/29/2020  5:02 PM  Wound Image     Dressing Type Foam - Lift dressing to assess site every shift Barrier Film (skin prep);Negative pressure wound therapy  Dressing Changed;Clean;Dry;Intact Changed  Dressing Change Frequency -- Every 3 days  State of Healing -- Early/partial granulation  Site / Wound Assessment -- Yellow;Red;Black  % Wound base Red or Granulating -- 20%  % Wound base Yellow/Fibrinous Exudate -- 70%  % Wound base Black/Eschar -- 10%  % Wound base Other/Granulation Tissue (Comment) -- 0%  Peri-wound Assessment -- Intact  Margins -- Unattached edges (unapproximated)  Drainage Amount -- Moderate  Drainage Description -- Sanguineous  Treatment -- Debridement (Selective);Hydrotherapy (Pulse lavage);Packing (Saline gauze)     No Linked orders to display     Negative Pressure Wound Therapy Sacrum (Active)  Placement Date/Time: 11/22/20 1500   Wound Type: (c) Other (Comment)  Location: Sacrum     Assessments 11/22/2020  4:59 PM 11/30/2020 10:03 PM  Last dressing change 11/22/20 --  Site / Wound Assessment Granulation tissue;Pale;Yellow Clean;Dry  Peri-wound Assessment Intact Intact  Size see above --  Wound filler - Black foam 2 --  Wound filler - White foam 0 --  Wound filler - Nonadherent 0 --  Wound filler - Gauze 0 --  Cycle Continuous --  Target Pressure (mmHg) 125 --  Instillation Volume 26 mL --  Instillation Solution Normal Saline --  Instillation Soak Time 10 minutes --  Instillation Therapy Time 3.5 hours --  Canister Changed No --  Dressing Status Intact Intact  Drainage Amount None --  Output (mL) -- 100 mL     No Linked orders to display    Hypomagnesemia -Continue oral magnesium -1/21 give 4 g magnesium IV x1 -Follow labs  Other problems: History of stage IV colon cancer -Old colostomy  DVT bilateral posterior tibial veins -Was not present during initial COVID admission -Continue Eliquis for now given severe debility and increased risk for developing recurrent DVT  Hypertension -Having issues with orthostasis requiring midodrine Prior to admission patient was on Norvasc  Dyslipidemia -Prior to admission patient was on Crestor-consider resuming soon once patient can swallow pills whole  Abnormal TSH -Nov 2021 TSH was 0.015-TSH and free T4, T3 normal this admission  Data Reviewed: Basic Metabolic Panel: Recent Labs  Lab 11/24/20 0906 11/25/20 0440 11/26/20 0916 11/27/20 0115 11/28/20 0044 11/30/20 1248  NA 137 135 134* 131* 132* 131*  K 3.8 4.9 4.3 4.6 3.9 3.4*  CL 99 98 95* 94* 95* 96*  CO2 25 24 25  20* 24 23  GLUCOSE 101* 133* 105* 229* 224* 294*  BUN 45* 24* 61* 103* 47* 108*  CREATININE 2.15* 1.86* 2.59* 2.78* 1.56* 1.70*  CALCIUM 10.1 10.6* 10.4* 9.5 9.4 10.0  PHOS 7.1*  --  6.6* 7.2*  --  4.5   Liver Function Tests: Recent Labs  Lab 11/24/20 0906 11/26/20 0916 11/27/20 0115 11/30/20 1248  ALBUMIN 2.5* 2.6* 2.6* 2.3*    No results for input(s): LIPASE, AMYLASE in the last 168 hours. No results for input(s): AMMONIA in the last 168 hours. CBC: Recent Labs  Lab 11/24/20 0906 11/25/20 0440 11/26/20 0916 11/30/20 1248  WBC 12.5* 15.4* 15.2* 16.4*  NEUTROABS  --  10.0*  --   --   HGB 8.9* 10.0* 8.9* 8.3*  HCT 28.9* 33.4* 29.5* 27.2*  MCV 88.7 88.4 86.8 85.5  PLT 397 386 410* 369   Cardiac Enzymes: No results for input(s): CKTOTAL, CKMB, CKMBINDEX, TROPONINI in the last  168 hours. BNP (last 3 results) Recent Labs    09/07/20 0118 09/08/20 0446 09/09/20 0428  BNP 216.8* 432.5* 609.7*    ProBNP (last 3 results) No results for input(s): PROBNP in the last 8760 hours.  CBG: Recent Labs  Lab 11/29/20 1552 11/29/20 1948 11/30/20 0800 11/30/20 1734 11/30/20 2101  GLUCAP 158* 212* 333* 129* 263*    Recent Results (from the past 240 hour(s))  Culture, respiratory (non-expectorated)     Status: None   Collection Time: 11/22/20  4:45 PM   Specimen: Tracheal Aspirate; Respiratory  Result Value Ref Range Status   Specimen Description TRACHEAL ASPIRATE  Final   Special Requests NONE  Final   Gram Stain   Final    ABUNDANT WBC PRESENT,BOTH PMN AND MONONUCLEAR ABUNDANT GRAM POSITIVE COCCI IN PAIRS Performed at [facility], [place] [address] [place], [place] [address]    Culture MODERATE STAPHYLOCOCCUS AUREUS  Final   Report Status 11/25/2020 FINAL  Final   Organism ID, Bacteria STAPHYLOCOCCUS AUREUS  Final      Susceptibility   Staphylococcus aureus - MIC*    CIPROFLOXACIN <=0.5 SENSITIVE Sensitive     ERYTHROMYCIN <=0.25 SENSITIVE Sensitive     GENTAMICIN <=0.5 SENSITIVE Sensitive     OXACILLIN <=0.25 SENSITIVE Sensitive     TETRACYCLINE <=1 SENSITIVE Sensitive     VANCOMYCIN <=0.5 SENSITIVE Sensitive     TRIMETH/SULFA <=10 SENSITIVE Sensitive     CLINDAMYCIN <=0.25 SENSITIVE Sensitive     RIFAMPIN <=0.5 SENSITIVE Sensitive     Inducible Clindamycin NEGATIVE Sensitive     *  MODERATE STAPHYLOCOCCUS AUREUS     Studies: IR Fluoro Guide CV Line Right  Result Date: 11/29/2020 INDICATION: 70 year old female with history of end-stage renal disease on hemodialysis status post tunneled hemodialysis catheter placement on 10/19/2020, presenting with malfunctioning catheter, exposed cuff. EXAM: TUNNELED CENTRAL VENOUS HEMODIALYSIS CATHETER REPLACEMENT WITH FLUOROSCOPIC GUIDANCE MEDICATIONS: None. FLUOROSCOPY TIME:  0 minutes 7 seconds, 4 mGy COMPLICATIONS: None immediate. PROCEDURE: Informed written consent was obtained from the patient after a discussion of the risks, benefits, and alternatives to treatment. Questions regarding the procedure were encouraged and answered. The skin and external portion of the existing hemodialysis catheter was prepped with chlorhexidine in a sterile fashion, and a sterile drape was applied covering the operative field. Maximum barrier sterile technique with sterile gowns and gloves were used for the procedure. A timeout was performed prior to the initiation of the procedure. Both lumens of the hemodialysis catheter were cannulated with a stiff Glidewire and advanced to the level of the right atrium. Under intermittent fluoroscopic guidance, the existing dialysis catheter was exchanged for a new 23 cm tip to cuff palindrome dialysis catheter with tips ultimately terminating within the right atrium. Final catheter positioning was confirmed and documented with a spot radiographic image. The catheter aspirates and flushes normally. The catheter was flushed with appropriate volume heparin dwells. The catheter exit site was secured with 2, 0 silk retention sutures. Both lumens were heparinized. A dressing was placed. The patient tolerated the procedure well without immediate post procedural complication. IMPRESSION: Successful replacement of 23 cm tip to cuff tunneled hemodialysis catheter with tips terminating within the right atrium. The catheter is ready for  immediate use. Ruthann Cancer, MD [facility]iology Electronically Signed   By: Ruthann Cancer MD   On: 11/29/2020 11:59    Scheduled Meds: . apixaban  2.5 mg Oral BID  . chlorhexidine gluconate (MEDLINE KIT)  15 mL Mouth Rinse BID  . Chlorhexidine Gluconate Cloth  6 each Topical Daily  . cholecalciferol  2,000 Units Oral Daily  . clonazepam  0.25 mg Oral QHS  . collagenase   Topical BID  . darbepoetin (ARANESP) injection - DIALYSIS  100 mcg Intravenous Q Sat-HD  . feeding supplement (NEPRO CARB STEADY)  1,000 mL Per Tube Q24H  . feeding supplement (PROSource TF)  45 mL Per Tube TID  . guaiFENesin  15 mL Oral Q12H  . insulin aspart  0-5 Units Subcutaneous QHS  . insulin aspart  0-9 Units Subcutaneous TID WC  . mouth rinse  15 mL Mouth Rinse QID  . megestrol  200 mg Oral QID  . metoprolol tartrate  50 mg Oral BID  . midodrine  40 mg Oral TID WC  . multivitamin  1 tablet Oral QHS  . nutrition supplement (JUVEN)  1 packet Oral BID BM  . pantoprazole sodium  40 mg Oral BID  . sodium chloride flush  10-40 mL Intracatheter Q12H   Continuous Infusions: . sodium chloride 5 mL/hr at 11/16/20 0935  . sodium chloride    . albumin human 25 g (11/16/20 1516)  .  ceFAZolin (ANCEF) IV 1 g (11/30/20 2220)    Principal Problem:   Atrial fibrillation with RVR (HCC) Active Problems:   Acute respiratory failure due to COVID-19 [facility])   New onset a-fib (HCC)   Leukocytosis   Atrial fibrillation with rapid ventricular response (HCC)   Hypoxia   Pressure injury of skin   Acute hypoxemic respiratory failure (HCC)   ARDS (adult respiratory distress syndrome) (HCC)   Perforated duodenal ulcer ([place])   On mechanically assisted ventilation ([place])   Palliative care by specialist   Goals of care, counseling/discussion   Shock ([place])   Acute and chronic respiratory failure (acute-on-chronic) ([place])   Status post tracheostomy ([place])   ESRD (end stage  renal disease) ([place])   HAP (hospital-acquired pneumonia)   Consultants:  Cardiology  Surgery  Nephrology  Ethics  PCCM   Procedures: R PICC 11/5 >> A line 11/9 >> out ETT 11/9 > 11/16, 11/16 >> 09/21/2020, 09/21/2020 tracheostomy>> Lt [place] CVL 11/9 >> R IJ trialysis >> out HD catheter 12/1 >>12/20 12/21 - 14.5 Fr, 23 cm right IJ tunneled hemodialysis catheter placement. Removal of indwelling subclavian catheter.   Antibiotics: Anti-infectives (From admission, onward)   Start     Dose/Rate Route Frequency Ordered Stop   11/27/20 2000  ceFAZolin (ANCEF) IVPB 1 g/50 mL premix        1 g 100 mL/hr over 30 Minutes Intravenous Every 24 hours 11/26/20 1248 12/04/20 1959   11/26/20 1345  ceFAZolin (ANCEF) IVPB 1 g/50 mL premix        1 g 100 mL/hr over 30 Minutes Intravenous  Once 11/26/20 1248 11/26/20 1433   11/17/20 1415  fluconazole (DIFLUCAN) 40 MG/ML suspension 200 mg        200 mg Oral  Once 11/17/20 1315 11/17/20 1357   11/03/20 2200  meropenem (MERREM) 500 mg in sodium chloride 0.9 % 100 mL IVPB        500 mg 200 mL/hr over 30 Minutes Intravenous Every 24 hours 11/03/20 1500 11/06/20 2217   11/01/20 1000  anidulafungin (ERAXIS) 100 mg in sodium chloride 0.9 % 100 mL IVPB  Status:  Discontinued       "Followed by" Linked Group Details   100 mg 78 mL/hr over 100 Minutes Intravenous Every 24 hours 10/31/20  5638 11/01/20 1404   10/31/20 1015  meropenem (MERREM) 1 g in sodium chloride 0.9 % 100 mL IVPB  Status:  Discontinued        1 g 200 mL/hr over 30 Minutes Intravenous Every 8 hours 10/31/20 0916 11/03/20 1500   10/31/20 1015  linezolid (ZYVOX) IVPB 600 mg  Status:  Discontinued        600 mg 300 mL/hr over 60 Minutes Intravenous Every 12 hours 10/31/20 0916 11/02/20 0906   10/31/20 1015  anidulafungin (ERAXIS) 200 mg in sodium chloride 0.9 % 200 mL IVPB       "Followed by" Linked Group Details   200 mg 78 mL/hr over 200 Minutes Intravenous  Once 10/31/20 0916  10/31/20 1630   10/25/20 1800  ceFAZolin (ANCEF) IVPB 2g/100 mL premix  Status:  Discontinued        2 g 200 mL/hr over 30 Minutes Intravenous Every 12 hours 10/25/20 1022 10/30/20 1103   10/23/20 2000  vancomycin (VANCOREADY) IVPB 750 mg/150 mL  Status:  Discontinued        750 mg 150 mL/hr over 60 Minutes Intravenous Every 24 hours 10/22/20 2036 10/25/20 1022   10/23/20 1800  piperacillin-tazobactam (ZOSYN) IVPB 3.375 g  Status:  Discontinued        3.375 g 100 mL/hr over 30 Minutes Intravenous Every 6 hours 10/23/20 1155 10/24/20 1422   10/23/20 0200  piperacillin-tazobactam (ZOSYN) IVPB 3.375 g  Status:  Discontinued        3.375 g 100 mL/hr over 30 Minutes Intravenous Every 8 hours 10/22/20 2036 10/23/20 1155   10/22/20 1630  vancomycin (VANCOREADY) IVPB 1500 mg/300 mL        1,500 mg 150 mL/hr over 120 Minutes Intravenous  Once 10/22/20 1537 10/22/20 2229   10/22/20 1630  piperacillin-tazobactam (ZOSYN) IVPB 2.25 g  Status:  Discontinued        2.25 g 100 mL/hr over 30 Minutes Intravenous Every 8 hours 10/22/20 1537 10/22/20 2036   10/22/20 1537  vancomycin variable dose per unstable renal function (pharmacist dosing)  Status:  Discontinued         Does not apply See admin instructions 10/22/20 1537 10/22/20 2036   10/19/20 0600  ceFAZolin (ANCEF) IVPB 2g/100 mL premix        2 g 200 mL/hr over 30 Minutes Intravenous To Radiology 10/18/20 1457 10/19/20 0938   10/10/20 0830  vancomycin (VANCOCIN) IVPB 1000 mg/200 mL premix       "Followed by" Linked Group Details   1,000 mg 200 mL/hr over 60 Minutes Intravenous Every 24 hours 10/09/20 0744 10/15/20 0902   10/09/20 0830  piperacillin-tazobactam (ZOSYN) IVPB 3.375 g        3.375 g 100 mL/hr over 30 Minutes Intravenous Every 6 hours 10/09/20 0744 10/16/20 0038   10/09/20 0830  vancomycin (VANCOREADY) IVPB 2000 mg/400 mL       "Followed by" Linked Group Details   2,000 mg 200 mL/hr over 120 Minutes Intravenous  Once 10/09/20 0744  10/09/20 1022   09/24/20 1000  ceFAZolin (ANCEF) IVPB 2g/100 mL premix  Status:  Discontinued        2 g 200 mL/hr over 30 Minutes Intravenous Every 12 hours 09/24/20 0801 09/24/20 1046   09/23/20 1400  vancomycin (VANCOCIN) IVPB 1000 mg/200 mL premix        1,000 mg 200 mL/hr over 60 Minutes Intravenous Every 24 hours 09/22/20 1436 09/28/20 1718   09/22/20 2200  ceFEPIme (MAXIPIME) 2 g  in sodium chloride 0.9 % 100 mL IVPB  Status:  Discontinued        2 g 200 mL/hr over 30 Minutes Intravenous Every 12 hours 09/22/20 1436 09/24/20 0801   09/22/20 1030  ceFEPIme (MAXIPIME) 1 g in sodium chloride 0.9 % 100 mL IVPB        1 g 200 mL/hr over 30 Minutes Intravenous  Once 09/22/20 0934 09/22/20 1145   09/22/20 1015  vancomycin (VANCOCIN) IVPB 1000 mg/200 mL premix        1,000 mg 200 mL/hr over 60 Minutes Intravenous  Once 09/22/20 0934 09/22/20 1446   09/12/20 2200  ceFEPIme (MAXIPIME) 2 g in sodium chloride 0.9 % 100 mL IVPB        2 g 200 mL/hr over 30 Minutes Intravenous Every 12 hours 09/12/20 0732 09/14/20 2134   09/11/20 1400  ceFEPIme (MAXIPIME) 2 g in sodium chloride 0.9 % 100 mL IVPB  Status:  Discontinued        2 g 200 mL/hr over 30 Minutes Intravenous Every 8 hours 09/11/20 1126 09/12/20 0732   09/08/20 1000  vancomycin (VANCOREADY) IVPB 2000 mg/400 mL        2,000 mg 200 mL/hr over 120 Minutes Intravenous  Once 09/08/20 0857 09/08/20 1224   09/08/20 1000  ceFEPIme (MAXIPIME) 2 g in sodium chloride 0.9 % 100 mL IVPB  Status:  Discontinued        2 g 200 mL/hr over 30 Minutes Intravenous Every 12 hours 09/08/20 0857 09/11/20 1126   09/08/20 0856  vancomycin variable dose per unstable renal function (pharmacist dosing)  Status:  Discontinued         Does not apply See admin instructions 09/08/20 0857 09/09/20 0935   09/02/20 1600  cefTRIAXone (ROCEPHIN) 1 g in sodium chloride 0.9 % 100 mL IVPB  Status:  Discontinued        1 g 200 mL/hr over 30 Minutes Intravenous Every 24 hours  09/01/20 1811 09/02/20 0838   09/01/20 1800  fluconazole (DIFLUCAN) IVPB 400 mg        400 mg 50 mL/hr over 240 Minutes Intravenous  Once 09/01/20 1749 09/02/20 0603   09/01/20 1530  piperacillin-tazobactam (ZOSYN) IVPB 3.375 g        3.375 g 12.5 mL/hr over 240 Minutes Intravenous Every 8 hours 09/01/20 1514 09/05/20 2111   09/01/20 1000  levofloxacin (LEVAQUIN) tablet 250 mg  Status:  Discontinued        250 mg Oral Daily 08/31/20 1508 08/31/20 1735   08/31/20 1730  cefTRIAXone (ROCEPHIN) 1 g in sodium chloride 0.9 % 100 mL IVPB  Status:  Discontinued        1 g 200 mL/hr over 30 Minutes Intravenous Every 24 hours 08/31/20 1726 09/01/20 1513   08/31/20 1730  azithromycin (ZITHROMAX) 500 mg in sodium chloride 0.9 % 250 mL IVPB  Status:  Discontinued        500 mg 250 mL/hr over 60 Minutes Intravenous Every 24 hours 08/31/20 1726 09/02/20 0838       Time spent: 30 minutes    [name] ANP  [facility] 7 am - 330 pm/M-F for direct patient care and secure chat Please refer to Amion for contact info 91  days

## 2020-12-01 NOTE — Progress Notes (Signed)
Physical Therapy Treatment Patient Details Name: Candace [name] MRN: [id number] DOB: 1951-07-25 Today's Date: 12/01/2020    History of Present Illness 70 y.o. female with medical history significant for recent covid pna, obesity, colon cancer s/p colon resection with colostomy bag, HLD, NIDDM2, CKD3 presented to ED after her follow-up nurse advised her to present to ED for elevated HR. +afib, elevated troponins with demand ischemia,  CT scan is positive for bowel perforation with pneumomediastinum 11/03 exp lap with repair of perforated ulcer. 11/10 t/f to ICU- intubated/sedated/proned. Trach placed 11/24. CRRT off 12/8, restarted 12/10-1/5.    PT Comments    Continuing work on functional mobility and activity tolerance;  Session focused on initiating sitting OOB trials, and Candace [name] was able to tolerate Maximove transfer OOB to recliner overall well; took extra time to work on positioning in the recliner for as much comfort as possible; Daughter, [name] present and helpful; Session conducted on room air, and Vitals were stable;   We discussed the need for Candace [name] to be able to tolerate 4-6 hours of sitting at one time in order to be able to go to Outpt HD; will continue working on sitting tolerance, and I'm hopeful we can have her go to HD in hospital in the HD recliner within the next week;   Post-acute rehab is still our optimal choice for dc planning -- where she will receive more volume and consistency of therapies, and can maximize independence and safety with mobility, ADLs, and lessen caregiver burden; Will see if CIR is an option: she has been better able to participate here these last few sessions, and will have lots of assistance at home  Follow Up Recommendations  Post-Acute Rehab;Other (comment) Pt's family has stated they are not interested in pursuing SNF or LTACH, and CIR is a longshot; It seems likely the family will chose home, and in that case, recommend maximal equipment  (outlined below) and maximizing HHservices     Equipment Recommendations  Wheelchair (measurements PT);Wheelchair cushion (measurements PT);Hospital bed;Other (comment) (air mattress, hoyer lift, ideally a custom WC 18x18 tilt in space with roho cushion.)    Recommendations for Other Services  Consult with Scientist, water quality, to have specialty wheelchair and a pressure redistribution cushion ready for when she discharges home, as she will need to be getting to [place] [facility] pretty immediately;  I recommend reaching out to [employer] with [employer] (Work number: [phone])     Precautions / Restrictions Precautions Precautions: Fall Precaution Comments: baseline colostomy; sacral wound with wound vac, trach with PMV Restrictions Weight Bearing Restrictions: No    Mobility  Bed Mobility Overal bed mobility: Needs Assistance Bed Mobility: Rolling Rolling: Mod assist;+2 for physical assistance;+2 for safety/equipment         General bed mobility comments: Mod A x 2 for rolling side to side for placement of maximove pad  Transfers Overall transfer level: Needs assistance Equipment used: Ambulation equipment used             General transfer comment: Use of maximove for transfer to recliner chair to improve sitting tolerance for dialysis. Daughter present and assisting throughout.  Ambulation/Gait                 Stairs             Wheelchair Mobility    Modified Rankin (Stroke Patients Only)       Balance Overall balance assessment: Needs assistance   Sitting balance-Leahy Scale: Zero Sitting  balance - Comments: Required assist to pull from supported sitting to unsupproted sitting in recliner                                    Cognition Arousal/Alertness: Awake/alert Behavior During Therapy: WFL for tasks assessed/performed Overall Cognitive Status: Impaired/Different from baseline Area of Impairment:  Attention;Memory;Following commands;Safety/judgement;Awareness;Problem solving                   Current Attention Level: Selective Memory: Decreased short-term memory Following Commands: Follows one step commands with increased time;Follows one step commands consistently Safety/Judgement: Decreased awareness of safety;Decreased awareness of deficits Awareness: Intellectual Problem Solving: Slow processing;Decreased initiation;Difficulty sequencing;Requires verbal cues;Requires tactile cues General Comments: Pt alert, mouthing words around trach in hushed tones.  Follows commands consistently and with increased time      Exercises      General Comments General comments (skin integrity, edema, etc.): VSS on RA. BP WFL once sititng up in chair. Repositioned with pillows, placed geomats in chair to improve positioning. Pt wanted to have feet down, propped with blankets under B feet to improve ankle function. NP and CM entering during session and assisting in repositioning. Educated to float heels if footrest placed up. Daughter reports pt does not like wearing prevalon boots - PT confirmed with NP present ok to try Prafo boots to maintain proper joint alignment      Pertinent Vitals/Pain Pain Assessment: Faces Faces Pain Scale: Hurts little more Pain Location: bottom and B feet with certain movements Pain Descriptors / Indicators: Grimacing Pain Intervention(s): Monitored during session    Home Living                      Prior Function            PT Goals (current goals can now be found in the care plan section) Acute Rehab PT Goals Patient Stated Goal: be able to write, be able to be more independent PT Goal Formulation: With patient/family Time For Goal Achievement: 12/01/20 Potential to Achieve Goals: Fair Progress towards PT goals: Progressing toward goals (slowly)    Frequency    Min 2X/week      PT Plan Current plan remains appropriate     Co-evaluation PT/OT/SLP Co-Evaluation/Treatment: Yes Reason for Co-Treatment: Complexity of the patient's impairments (multi-system involvement) PT goals addressed during session: Mobility/safety with mobility OT goals addressed during session: ADL's and self-care;Strengthening/ROM      AM-PAC PT "6 Clicks" Mobility   Outcome Measure  Help needed turning from your back to your side while in a flat bed without using bedrails?: A Lot Help needed moving from lying on your back to sitting on the side of a flat bed without using bedrails?: Total Help needed moving to and from a bed to a chair (including a wheelchair)?: Total Help needed standing up from a chair using your arms (e.g., wheelchair or bedside chair)?: Total Help needed to walk in hospital room?: Total Help needed climbing 3-5 steps with a railing? : Total 6 Click Score: 7    End of Session Equipment Utilized During Treatment: Other (comment) (Maximove) Activity Tolerance: Patient tolerated treatment well Patient left: in chair;with call bell/phone within reach;with family/visitor present Nurse Communication: Mobility status;Need for lift equipment PT Visit Diagnosis: Muscle weakness (generalized) (M62.81);Difficulty in walking, not elsewhere classified (R26.2);Pain;Adult, failure to thrive (R62.7);Unsteadiness on feet (R26.81) Pain - Right/Left: Right Pain -  part of body: Knee     Time: 5170-0174 PT Time Calculation (min) (ACUTE ONLY): 65 min  Charges:  $Therapeutic Activity: 23-37 mins                     Roney Marion, PT  Acute Rehabilitation Services Pager [phone] Office [phone]    [place] 12/01/2020, 3:25 PM

## 2020-12-02 DIAGNOSIS — I4891 Unspecified atrial fibrillation: Secondary | ICD-10-CM | POA: Diagnosis not present

## 2020-12-02 LAB — RENAL FUNCTION PANEL
Albumin: 2.2 g/dL — ABNORMAL LOW (ref 3.5–5.0)
Anion gap: 14 (ref 5–15)
BUN: 66 mg/dL — ABNORMAL HIGH (ref 8–23)
CO2: 23 mmol/L (ref 22–32)
Calcium: 10.1 mg/dL (ref 8.9–10.3)
Chloride: 95 mmol/L — ABNORMAL LOW (ref 98–111)
Creatinine, Ser: 1.44 mg/dL — ABNORMAL HIGH (ref 0.44–1.00)
GFR, Estimated: 39 mL/min — ABNORMAL LOW (ref >60)
Glucose, Bld: 339 mg/dL — ABNORMAL HIGH (ref 70–99)
Phosphorus: 4.3 mg/dL (ref 2.5–4.6)
Potassium: 3.7 mmol/L (ref 3.5–5.1)
Sodium: 132 mmol/L — ABNORMAL LOW (ref 135–145)

## 2020-12-02 LAB — IRON AND TIBC
Iron: 35 ug/dL (ref 28–170)
Saturation Ratios: 11 % (ref 10.4–31.8)
TIBC: 308 ug/dL (ref 250–450)
UIBC: 273 ug/dL

## 2020-12-02 LAB — GLUCOSE, CAPILLARY
Glucose-Capillary: 284 mg/dL — ABNORMAL HIGH (ref 70–99)
Glucose-Capillary: 288 mg/dL — ABNORMAL HIGH (ref 70–99)
Glucose-Capillary: 222 mg/dL — ABNORMAL HIGH (ref 70–99)
Glucose-Capillary: 288 mg/dL — ABNORMAL HIGH (ref 70–99)
Glucose-Capillary: 212 mg/dL — ABNORMAL HIGH (ref 70–99)

## 2020-12-02 LAB — FERRITIN: Ferritin: 1014 ng/mL — ABNORMAL HIGH (ref 11–307)

## 2020-12-02 MED ORDER — INSULIN ASPART 100 UNIT/ML ~~LOC~~ SOLN: 2.0000 [IU] | SUBCUTANEOUS | Status: DC

## 2020-12-02 MED ORDER — SODIUM CHLORIDE 0.9 % IV SOLN
125.0000 mg | Freq: Every day | INTRAVENOUS | Status: AC
  Administered 2020-12-02 – 2020-12-06 (×5): 125 mg via INTRAVENOUS
  Filled 2020-12-02 (×6): qty 10

## 2020-12-02 MED ORDER — INSULIN ASPART 100 UNIT/ML ~~LOC~~ SOLN
0.0000 [IU] | Freq: Three times a day (TID) | SUBCUTANEOUS | Status: DC
  Administered 2020-12-02 (×2): 3 [IU] via SUBCUTANEOUS
  Administered 2020-12-02 – 2020-12-03 (×2): 2 [IU] via SUBCUTANEOUS
  Administered 2020-12-03: 3 [IU] via SUBCUTANEOUS
  Administered 2020-12-04: 1 [IU] via SUBCUTANEOUS
  Administered 2020-12-05: 2 [IU] via SUBCUTANEOUS
  Administered 2020-12-05: 1 [IU] via SUBCUTANEOUS
  Administered 2020-12-06: 2 [IU] via SUBCUTANEOUS
  Administered 2020-12-08 (×2): 1 [IU] via SUBCUTANEOUS
  Administered 2020-12-08: 2 [IU] via SUBCUTANEOUS
  Administered 2020-12-09: 1 [IU] via SUBCUTANEOUS
  Administered 2020-12-10: 2 [IU] via SUBCUTANEOUS
  Administered 2020-12-10: 3 [IU] via SUBCUTANEOUS
  Administered 2020-12-11 – 2020-12-13 (×5): 1 [IU] via SUBCUTANEOUS
  Administered 2020-12-13: 2 [IU] via SUBCUTANEOUS
  Administered 2020-12-14: 3 [IU] via SUBCUTANEOUS

## 2020-12-02 MED ORDER — NEPRO/CARBSTEADY PO LIQD
237.0000 mL | Freq: Three times a day (TID) | ORAL | Status: DC
  Administered 2020-12-02 – 2020-12-19 (×47): 237 mL via ORAL
  Filled 2020-12-02 (×4): qty 237

## 2020-12-02 NOTE — Progress Notes (Addendum)
[facility] PROGRESS NOTE  Idania Desouza ONG:[id number] DOB: 1951-10-15 DOA: 08/31/2020 PCP: Algis Greenhouse, MD            Status: Remains inpatient appropriate because:Ongoing diagnostic testing needed not appropriate for outpatient work up, Unsafe d/c plan, IV treatments appropriate due to intensity of illness or inability to take PO and Inpatient level of care appropriate due to severity of illness   Dispo: The patient is from: Home              Anticipated d/c is to: Home with home health               Anticipated d/c date is: > 3 days              Patient currently is not medically stable to d/c.  Barriers to discharge: Continues to require HD- unable to sit in chair/get in [facility] for OP HD-also requiring Cortrak for nocturnal feeds; still has trach with copiuous secretions at times  Considerations for home discharge: 1. Patient will need to continue wound VAC and current wound VAC is apparently not available in the home setting 2. It is likely she will need a specialty bed.  We are currently transitioning back to the [place] rotational bed which will allow for better in bed mobility including the ability to "stand" with PT in the bed without actually getting patient out of bed 3. Likely would need to be decannulated to minimize other care issues after discharge 4. Would need to be able to either mobilize for outpatient hemodialysis or be a candidate for home dialysis 5. Would not have aggressive PT OT available in the home setting that she can achieve during hospitalization 6. Patient will have family members that are CNAs managing her after discharge but this would be 24/7 requirement and this could be an overwhelming issue once patient is actually home  Code Status: Full Family Communication: 2/3 daughter [name] at bedside-extensive conversation held at bedside with daughter to include myself, Retail buyer, Editor, commissioning, and [facility] personnel DVT prophylaxis:  Eliquis Vaccination status: Has not been vaccinated but did have severe COVID infection initially diagnosed on 08/23/2020.  Patient will be eligible for COVID-vaccine after 11/23/2020   Foley catheter: No, purewick female urinary collection device   HPI: 70 year old female patient with prior history of diabetes, stage III chronic kidney disease, atrial fibrillation, hypertension, dyslipidemia and colon cancer that is post colectomy and colostomy.  Initially diagnosed with COVID on 08/23/20 she was admitted to the hospital due to dehydration, acute kidney injury and hypoxemic respiratory failure.  She was discharged home on 2 to 3 L of oxygen after being treated with IV steroids and remdesivir.  During that time although she had elevated inflammatory markers she had no embolic or thrombotic disease.  She had been on prophylactic dose heparin during the hospitalization and was placed on Eliquis for 2 weeks for after discharge  Patient returned to the ER on 08/31/2020 for complaints of not feeling well.  She was found to be in atrial fibrillation with RVR, hemorrhagic shock and work-up revealed a perforated duodenal ulcer.  She underwent exploratory laparotomy on 11/3.  She has had an extensive ICU stay with significant events as below.   11/2 Admitted with rapid Afib 11/3 OR with findings of perforated duodenal ulcer 11/10 progressive hemorrhagic shock, intubated, transfused, pressors, proned; started on CRRT in PM 11/16 Extubated. Re-intubated overnight due to respiratory distress and hypoxia with decreased mentation 11/18 Bronch, cultures  sent 11/19 Hgb down getting blood 11/24 Spiked fever, resume empirical antimicrobial therapy 11/26 Hemorrhagic shock, hgb 5.6, increased pressors,  11/30 Per palliative "[name] understanding that patient is unfortunately very fragiledespite ongoing intensive medical treatment and full mechanical support. [name] that the familywantsto  continue with all current interventions despite potential outcomes". 12/08 CRRT discontinued due to clotting 12/09 Family requested transfer to tertiary care [facility] denied transfer  12/10 CRRT restarted. Episodes of tachycardia, tachypnea that seem to improve with pain management 12/11 Back in shock. Pressor requirements up. CXR worse. ABX resumed 12/12 Still requiring inc pressors. Had hypoglycemic event. Basal insulin dosing adjusted 12/13 Pressor requirements better. Now hyperglycemic. Re-adjusted Glycemic control  12/19 Afebrile . Remains on dilaudid and heparin gtt, dilaudid gtt increased overnight for concern of pain / ongoing tachycardia, no other events . NE and precedex off 12/17. Ongoing CRRT- even UF, dosing lokelmia this morning 12/20 On CRRT. Renal plans for HD cath and moving to HD. Getting wound care 12/24 - renal stopping CRRT today and plans HD 10/24/20 . 40% fio2 on vent via Trach. TAchypenic and tachycardic. Afebrile but wbc up to 27.6K. On TF. On heparin gtt 12/25 - Back on CRRT. On vent via trach at 40% fio2, On scheduled dilaudid as add on to oxy. Per family request 12/24 - they felt scheduled oxy was not adequate and patient was showing signs of opioid withdrawal.  Patient also had worsening SIRS/sepsis syndrome. Had fever, rising wbc, worsening RR 40-60 and HR 140s sinus->started On abx yesterday. Fever some better today. WBC plateau at 28,.5K. On new levophed ->signifanct improvement in HR 77 and RR t0 20. On heparin gtt. On precedex gtt. On levophed gtt 48mg wthi midodrine. On TF 12/30 Remains on CRRT with intermittent pressor requirements. Ethics consult placed 12/29. Ethics rec time trial of CRRT 12/31 failed SBT with RR 40s. Several conversations between care tam and family, who are upset RE plan of care 1/1 back on pressors  1/4: On pressors, keeping even on CVVHD, HGB drop to 6.9, transfused 1 unit. Improving mental status 1/10 remains on low dose levophed  222m 1/11 off levo, attempting HD with UF for first time. Now tolerating intermittent HD. 1/19 patient transferred to progressive bed with tracheostomy on 35% FiO2.  has NG tube feeding.  No PEG tube.  Received dialysis on 1/18. 1/24 significant improvement and persistent tachycardia after introduction of twice daily beta-blocker.  Heart rates have improved from the 130s to the 98-104 range 1/24 core track clogged and therefore has been removed by nutrition team.  Calorie count in progress and we are weaning any sedating medications which could be contributing to patient's inability to eat. 1/27 continue w/ cuff #6 trach   Subjective: Alert.  No complaints.  Enjoying solid diet.    Objective: Vitals:   12/02/20 0308 12/02/20 0736  BP: 116/71 130/75  Pulse: 93   Resp: 18 19  Temp: 98.8 F (37.1 C) 98.7 F (37.1 C)  SpO2: 100% 100%    Intake/Output Summary (Last 24 hours) at 12/02/2020 0745 Last data filed at 12/02/2020 072633ross per 24 hour  Intake 343.87 ml  Output 3045 ml  Net -2701.13 ml   Filed Weights    Exam: Constitutional: Alert, awake,, no acute distress Respiratory:  FiO2 21%. #6 cuffed trach with no secretions.  Posterior lung sounds clear to auscultation and no increased work of breathing Cardiovascular: Normal heart sounds S1-S2 without rubs murmurs thrills or gallops.  Pulse is regular Abdomen:  On regular diet with nectar thick liquids- LBM 2/2-core track tube in place for nocturnal tube feeding, colostomy in place with small amount of stool in bag distended with flatus Skin:  Massive sacral decubitus ulcer-wound VAC now in place Neurologic: CN 2-12 grossly intact. Sensation intact, DTR normal. Strength 1/5 x all 4 extremities.  Psychiatric: Awake, somewhat flat affect, oriented x3   Assessment/Plan: Acute problems: PAF maintaining sinus rhythm w/persistent tachycardia -Continue metoprolol -persistent sinus tachycardia secondary to deconditioning that worsens  with activity and hemodialysis -Cardiology has seen  -Echocardiogram this admission with an EF 50 to 55% with mild diastolic dysfunction parameters and normal RV systolic function -Continue Eliquis-benefits coordinator has determined that Eliquis is covered at [facility] [place] with a co-pay of $47  Acute respiratory failure secondary to COVID-pneumonia/tracheostomy/acute MSSA tracheobronchitis -Patient stable on low-flow oxygen FiO2 21% -PCCM/trach team following -Repeat respiratory culture on 1/24 with abundant staph with sensitivities pending -Continue IV Ancef for MSSA tracheobronchitis -Continue  suctioning and chest PT-Vibra vest -Continue PMV training   Acute kidney injury secondary to COVID-related sepsis with shock superimposed on stage III chronic kidney disease -Continue intermittent hemodialysis as directed by the renal team -Avoid nephrotoxic medications -Continue Aranesp on dialysis days -Patient is voiding and in the past 24 hours had 700 cc of urine out  Dysphagia/moderate to severe protein calorie malnutrition Nutrition Status: Nutrition Problem: Increased nutrient needs Etiology: wound healing Signs/Symptoms: estimated needs Interventions: Refer to RD note for recommendations  Estimated body mass index is 31.04 kg/m as calculated from the following:   Height as of this encounter: 5' 8"  (1.727 m).   Weight as of this encounter: 92.6 kg.  -2/3: Continue regular diet to carb modified nectar thick liquids given underlying diabetes diagnosis -2/3: Will trial stopping nocturnal tube feedings now that diet more palatable.  On 2/2 patient ate 25% of meals -nutrition team contacted me and stated patient is not swallowing her meats and is chewing them up and spitting them out although she did to up and swallow tilapia-per recommendation of nutrition team will add protein beverages and other protein supplements to diet -1/27 initiated Megace for appetite stimulant.  This medication  can precipitate thrombotic events but patient is fully anticoagulated with Eliquis.  Acute hemorrhagic shock secondary to perforated duodenal ulcer/anemia of critical illness -Stable postoperatively -Has tolerated tube feedings and oral diet without any evidence of postoperative obstructive issues which can be common after duodenal oversewing with subsequent scar tissue formation -Hemoglobin remains stable greater than 8.0  Diabetes mellitus 2 -Patient has been transition to regular diet along with tube feedings and we have noted significant increase in CBG readings -We will trial holding nocturnal tube feedings and allow oral diet only -Transition SSI to 3 times daily with meals very sensitive scale -HgbA1c 08/24/2020 and was 7.0 -Prior to admission patient was on metformin XR  Profound physical deconditioning/acute encephalopathy secondary to prolonged hospital stay/orthostasis -SNF recommended but family wants to take patient home -At this juncture patient unable to sit up in bed independently much less stand pivot and rotate to get into wheelchair and would be significant fall risk and care risk at home -In addition patient would need to be able to get in wheelchair to transport to dialysis if dialysis were to be continued in the outpatient setting -family refuses LTAC or SNF rehab due to pt prior request to NEVER be placed in any type of facility -Continue high-dose midodrine 3 times daily -Persistent tachycardia secondary to severe deconditioning  has improved with the introduction of beta-blockers -Continue PROM every 4 hours-patient encouraged to perform independent AROM as well -2/3 order placed for repositioning every 2 hours -2/2: Successful out of bed to chair with initial short-term plan for 45 minutes to 1 hour today -FAMILY THAT WILL PROVIDE CARE FOR PT WILL NEED TO ASSIST WITH CARE OF THIS PT AT [place]  Acute encephalopathy with ICU delirium -Delirium has  resolved and supportive meds of Klonopin and Seroquel have been weaned and discontinued  Stage IV sacral decubitus -Not present on admission -Wound care nurse following and utilizing Santyl and hydrotherapy and now has wound VAC in place -Surgical team consulted.  Current recommendation is against pursuing general anesthesia to undergo extensive surgical procedure-hopefully can be reevaluated in the future if wound does not heal adequately.  As of 1/28 they have nothing further acutely to add but will be following peripherally. [place] rotational bed  -Discontinued Duragesic patch.  Continue low-dose oxycodone for pain  -Currently bedside to answer medication questions that daughter had.  Patient's son also on speaker phone.  Great length regarding pharmacology of narcotic pain medications at this juncture daughter is agreeable to continuing OxyIR including request to have this medication given at least 1 to 1-1/2 hours prior to wound care.  She is also agreeable to utilizing as needed IV morphine with wound care only if oral premed with OxyIR not adequate.  I have updated patient's bedside LPN and I will also update physical therapy team.  -    11/24/2020 after [facility]      11/22/2020                       11/29/2020  Incision (Closed) 09/01/20 Abdomen (Active)  Date First Assessed/Time First Assessed: 09/01/20 1737   Location: Abdomen    Assessments 09/01/2020  6:25 PM 12/02/2020  8:16 AM  Dressing Type Gauze (Comment) Abdominal pads;Gauze (Comment)  Dressing - Clean;Dry;Intact  Dressing Change Frequency - Daily  Site / Wound Assessment Dressing in place / Unable to assess -  Drainage Amount None -     No Linked orders to display     Pressure Injury 09/17/20 Ear Left;Anterior;Posterior Stage 2 -  Partial thickness loss of dermis presenting as a shallow open injury with a red, pink wound bed without slough. (Active)  Date First Assessed/Time First Assessed: 09/17/20 0800   Location: Ear  Location  Orientation: Left;Anterior;Posterior  Staging: Stage 2 -  Partial thickness loss of dermis presenting as a shallow open injury with a red, pink wound bed without slough. ...    Assessments 09/17/2020  8:00 AM 11/29/2020  8:01 AM  Dressing Type Foam - Lift dressing to assess site every shift None  Dressing Clean;Dry;Intact -  Dressing Change Frequency Every 3 days -  State of Healing - Scar  Site / Wound Assessment Dry;Pink -  Peri-wound Assessment Intact -  Wound Length (cm) 2 cm -  Wound Width (cm) 1 cm -  Wound Depth (cm) 0.25 cm -  Wound Surface Area (cm^2) 2 cm^2 -  Wound Volume (cm^3) 0.5 cm^3 -  Margins Unattached edges (unapproximated) -  Drainage Amount Scant -  Drainage Description Serosanguineous -  Treatment Cleansed -     No Linked orders to display     Pressure Injury 09/25/20 Sacrum Bilateral;Medial Deep Tissue Pressure Injury - Purple or maroon localized area of discolored intact skin or blood-filled blister due to damage of underlying soft  tissue from pressure and/or shear. Purple, non-blanchable, b (Active)  Date First Assessed/Time First Assessed: 09/25/20 2000   Location: Sacrum  Location Orientation: Bilateral;Medial  Staging: Deep Tissue Pressure Injury - Purple or maroon localized area of discolored intact skin or blood-filled blister due to damage o...    Assessments 09/25/2020  5:00 PM 12/02/2020  8:16 AM  Dressing Type Foam - Lift dressing to assess site every shift Negative pressure wound therapy  Dressing Changed;Clean;Dry;Intact Clean;Dry;Intact  Dressing Change Frequency - Every 3 days     No Linked orders to display     Negative Pressure Wound Therapy Sacrum (Active)  Placement Date/Time: 11/22/20 1500   Wound Type: (c) Other (Comment)  Location: Sacrum    Assessments 11/22/2020  4:59 PM 12/02/2020  8:16 AM  Last dressing change 11/22/20 -  Site / Wound Assessment Granulation tissue;Pale;Yellow Clean;Dry  Peri-wound Assessment Intact -  Size see above -   Wound filler - Black foam 2 -  Wound filler - White foam 0 -  Wound filler - Nonadherent 0 -  Wound filler - Gauze 0 -  Cycle Continuous Continuous  Target Pressure (mmHg) 125 -  Instillation Volume 26 mL -  Instillation Solution Normal Saline -  Instillation Soak Time 10 minutes -  Instillation Therapy Time 3.5 hours -  Canister Changed No -  Dressing Status Intact -  Drainage Amount None -     No Linked orders to display     Other problems: History of stage IV colon cancer -Old colostomy  Acute encephalopathy with ICU delirium -Delirium has resolved and supportive meds of Klonopin and Seroquel have been weaned and discontinued  Hypomagnesemia -Continue oral magnesium -1/21 give 4 g magnesium IV x1 -Follow labs  DVT bilateral posterior tibial veins -Was not present during initial COVID admission -Continue Eliquis for now given severe debility and increased risk for developing recurrent DVT  Hypertension -Having issues with orthostasis requiring midodrine Prior to admission patient was on Norvasc  Dyslipidemia -Prior to admission patient was on Crestor-consider resuming soon once patient can swallow pills whole  Abnormal TSH -Nov 2021 TSH was 0.015-TSH and free T4, T3 normal this admission  Data Reviewed: Basic Metabolic Panel: Recent Labs  Lab 11/26/20 0916 11/27/20 0115 11/28/20 0044 11/30/20 1248 12/01/20 0844 12/02/20 0226  NA 134* 131* 132* 131* 132* 132*  K 4.3 4.6 3.9 3.4* 3.5 3.7  CL 95* 94* 95* 96* 95* 95*  CO2 25 20* 24 23 24 23   GLUCOSE 105* 229* 224* 294* 354* 339*  BUN 61* 103* 47* 108* 55* 66*  CREATININE 2.59* 2.78* 1.56* 1.70* 1.26* 1.44*  CALCIUM 10.4* 9.5 9.4 10.0 9.4 10.1  PHOS 6.6* 7.2*  --  4.5  --  4.3   Liver Function Tests: Recent Labs  Lab 11/26/20 0916 11/27/20 0115 11/30/20 1248 12/02/20 0226  ALBUMIN 2.6* 2.6* 2.3* 2.2*   No results for input(s): LIPASE, AMYLASE in the last 168 hours. No results for input(s):  AMMONIA in the last 168 hours. CBC: Recent Labs  Lab 11/26/20 0916 11/30/20 1248  WBC 15.2* 16.4*  HGB 8.9* 8.3*  HCT 29.5* 27.2*  MCV 86.8 85.5  PLT 410* 369   Cardiac Enzymes: No results for input(s): CKTOTAL, CKMB, CKMBINDEX, TROPONINI in the last 168 hours. BNP (last 3 results) Recent Labs    09/07/20 0118 09/08/20 0446 09/09/20 0428  BNP 216.8* 432.5* 609.7*    ProBNP (last 3 results) No results for input(s): PROBNP in the last 8760 hours.  CBG: Recent Labs  Lab 12/01/20 1634 12/01/20 1947 12/01/20 2256 12/02/20 0308 12/02/20 0732  GLUCAP 187* 281* 322* 284* 288*    Recent Results (from the past 240 hour(s))  Culture, respiratory (non-expectorated)     Status: None   Collection Time: 11/22/20  4:45 PM   Specimen: Tracheal Aspirate; Respiratory  Result Value Ref Range Status   Specimen Description TRACHEAL ASPIRATE  Final   Special Requests NONE  Final   Gram Stain   Final    ABUNDANT WBC PRESENT,BOTH PMN AND MONONUCLEAR ABUNDANT GRAM POSITIVE COCCI IN PAIRS Performed at [facility], [place] [address] [place], [place] [address]    Culture MODERATE STAPHYLOCOCCUS AUREUS  Final   Report Status 11/25/2020 FINAL  Final   Organism ID, Bacteria STAPHYLOCOCCUS AUREUS  Final      Susceptibility   Staphylococcus aureus - MIC*    CIPROFLOXACIN <=0.5 SENSITIVE Sensitive     ERYTHROMYCIN <=0.25 SENSITIVE Sensitive     GENTAMICIN <=0.5 SENSITIVE Sensitive     OXACILLIN <=0.25 SENSITIVE Sensitive     TETRACYCLINE <=1 SENSITIVE Sensitive     VANCOMYCIN <=0.5 SENSITIVE Sensitive     TRIMETH/SULFA <=10 SENSITIVE Sensitive     CLINDAMYCIN <=0.25 SENSITIVE Sensitive     RIFAMPIN <=0.5 SENSITIVE Sensitive     Inducible Clindamycin NEGATIVE Sensitive     * MODERATE STAPHYLOCOCCUS AUREUS     Studies: No results found.  Scheduled Meds: . apixaban  2.5 mg Oral BID  . chlorhexidine gluconate (MEDLINE KIT)  15 mL Mouth Rinse BID  . Chlorhexidine Gluconate Cloth   6 each Topical Daily  . cholecalciferol  2,000 Units Oral Daily  . clonazepam  0.25 mg Oral QHS  . collagenase   Topical BID  . darbepoetin (ARANESP) injection - DIALYSIS  100 mcg Intravenous Q Sat-HD  . feeding supplement (NEPRO CARB STEADY)  1,000 mL Per Tube Q24H  . feeding supplement (PROSource TF)  45 mL Per Tube TID  . guaiFENesin  15 mL Oral Q12H  . insulin aspart  0-6 Units Subcutaneous Q4H  . mouth rinse  15 mL Mouth Rinse QID  . megestrol  200 mg Oral QID  . metoprolol tartrate  50 mg Oral BID  . midodrine  40 mg Oral TID WC  . multivitamin  1 tablet Oral QHS  . nutrition supplement (JUVEN)  1 packet Oral BID BM  . pantoprazole sodium  40 mg Oral BID  . sodium chloride flush  10-40 mL Intracatheter Q12H   Continuous Infusions: . sodium chloride 50 mL/hr at 12/01/20 1814  . albumin human 25 g (11/16/20 1516)  .  ceFAZolin (ANCEF) IV 1 g (12/01/20 2042)    Principal Problem:   Atrial fibrillation with RVR (HCC) Active Problems:   Acute respiratory failure due to COVID-19 [facility])   New onset a-fib (HCC)   Leukocytosis   Atrial fibrillation with rapid ventricular response (HCC)   Hypoxia   Pressure injury of skin   Acute hypoxemic respiratory failure (HCC)   ARDS (adult respiratory distress syndrome) (HCC)   Perforated duodenal ulcer ([place])   On mechanically assisted ventilation ([place])   Palliative care by specialist   Goals of care, counseling/discussion   Shock ([place])   Acute and chronic respiratory failure (acute-on-chronic) ([place])   Status post tracheostomy ([place])   ESRD (end stage renal disease) ([place])   HAP (hospital-acquired pneumonia)   Consultants:  Cardiology  Surgery  Nephrology  Ethics  PCCM   Procedures: R PICC  11/5 >> A line 11/9 >> out ETT 11/9 > 11/16, 11/16 >> 09/21/2020, 09/21/2020 tracheostomy>> [facility] [place] [facility] 11/9 >> R IJ trialysis >> out HD catheter 12/1 >>12/20 12/21 - 14.5 Fr, 23 cm right IJ tunneled hemodialysis catheter placement.  Removal of indwelling subclavian catheter.   Antibiotics: Anti-infectives (From admission, onward)   Start     Dose/Rate Route Frequency Ordered Stop   11/27/20 2000  ceFAZolin (ANCEF) IVPB 1 g/50 mL premix        1 g 100 mL/hr over 30 Minutes Intravenous Every 24 hours 11/26/20 1248 12/04/20 1959   11/26/20 1345  ceFAZolin (ANCEF) IVPB 1 g/50 mL premix        1 g 100 mL/hr over 30 Minutes Intravenous  Once 11/26/20 1248 11/26/20 1433   11/17/20 1415  fluconazole (DIFLUCAN) 40 MG/ML suspension 200 mg        200 mg Oral  Once 11/17/20 1315 11/17/20 1357   11/03/20 2200  meropenem (MERREM) 500 mg in sodium chloride 0.9 % 100 mL IVPB        500 mg 200 mL/hr over 30 Minutes Intravenous Every 24 hours 11/03/20 1500 11/06/20 2217   11/01/20 1000  anidulafungin (ERAXIS) 100 mg in sodium chloride 0.9 % 100 mL IVPB  Status:  Discontinued       "Followed by" Linked Group Details   100 mg 78 mL/hr over 100 Minutes Intravenous Every 24 hours 10/31/20 0916 11/01/20 1404   10/31/20 1015  meropenem (MERREM) 1 g in sodium chloride 0.9 % 100 mL IVPB  Status:  Discontinued        1 g 200 mL/hr over 30 Minutes Intravenous Every 8 hours 10/31/20 0916 11/03/20 1500   10/31/20 1015  linezolid (ZYVOX) IVPB 600 mg  Status:  Discontinued        600 mg 300 mL/hr over 60 Minutes Intravenous Every 12 hours 10/31/20 0916 11/02/20 0906   10/31/20 1015  anidulafungin (ERAXIS) 200 mg in sodium chloride 0.9 % 200 mL IVPB       "Followed by" Linked Group Details   200 mg 78 mL/hr over 200 Minutes Intravenous  Once 10/31/20 0916 10/31/20 1630   10/25/20 1800  ceFAZolin (ANCEF) IVPB 2g/100 mL premix  Status:  Discontinued        2 g 200 mL/hr over 30 Minutes Intravenous Every 12 hours 10/25/20 1022 10/30/20 1103   10/23/20 2000  vancomycin (VANCOREADY) IVPB 750 mg/150 mL  Status:  Discontinued        750 mg 150 mL/hr over 60 Minutes Intravenous Every 24 hours 10/22/20 2036 10/25/20 1022   10/23/20 1800   piperacillin-tazobactam (ZOSYN) IVPB 3.375 g  Status:  Discontinued        3.375 g 100 mL/hr over 30 Minutes Intravenous Every 6 hours 10/23/20 1155 10/24/20 1422   10/23/20 0200  piperacillin-tazobactam (ZOSYN) IVPB 3.375 g  Status:  Discontinued        3.375 g 100 mL/hr over 30 Minutes Intravenous Every 8 hours 10/22/20 2036 10/23/20 1155   10/22/20 1630  vancomycin (VANCOREADY) IVPB 1500 mg/300 mL        1,500 mg 150 mL/hr over 120 Minutes Intravenous  Once 10/22/20 1537 10/22/20 2229   10/22/20 1630  piperacillin-tazobactam (ZOSYN) IVPB 2.25 g  Status:  Discontinued        2.25 g 100 mL/hr over 30 Minutes Intravenous Every 8 hours 10/22/20 1537 10/22/20 2036   10/22/20 1537  vancomycin variable dose per unstable renal function (  pharmacist dosing)  Status:  Discontinued         Does not apply See admin instructions 10/22/20 1537 10/22/20 2036   10/19/20 0600  ceFAZolin (ANCEF) IVPB 2g/100 mL premix        2 g 200 mL/hr over 30 Minutes Intravenous To Radiology 10/18/20 1457 10/19/20 0938   10/10/20 0830  vancomycin (VANCOCIN) IVPB 1000 mg/200 mL premix       "Followed by" Linked Group Details   1,000 mg 200 mL/hr over 60 Minutes Intravenous Every 24 hours 10/09/20 0744 10/15/20 0902   10/09/20 0830  piperacillin-tazobactam (ZOSYN) IVPB 3.375 g        3.375 g 100 mL/hr over 30 Minutes Intravenous Every 6 hours 10/09/20 0744 10/16/20 0038   10/09/20 0830  vancomycin (VANCOREADY) IVPB 2000 mg/400 mL       "Followed by" Linked Group Details   2,000 mg 200 mL/hr over 120 Minutes Intravenous  Once 10/09/20 0744 10/09/20 1022   09/24/20 1000  ceFAZolin (ANCEF) IVPB 2g/100 mL premix  Status:  Discontinued        2 g 200 mL/hr over 30 Minutes Intravenous Every 12 hours 09/24/20 0801 09/24/20 1046   09/23/20 1400  vancomycin (VANCOCIN) IVPB 1000 mg/200 mL premix        1,000 mg 200 mL/hr over 60 Minutes Intravenous Every 24 hours 09/22/20 1436 09/28/20 1718   09/22/20 2200  ceFEPIme  (MAXIPIME) 2 g in sodium chloride 0.9 % 100 mL IVPB  Status:  Discontinued        2 g 200 mL/hr over 30 Minutes Intravenous Every 12 hours 09/22/20 1436 09/24/20 0801   09/22/20 1030  ceFEPIme (MAXIPIME) 1 g in sodium chloride 0.9 % 100 mL IVPB        1 g 200 mL/hr over 30 Minutes Intravenous  Once 09/22/20 0934 09/22/20 1145   09/22/20 1015  vancomycin (VANCOCIN) IVPB 1000 mg/200 mL premix        1,000 mg 200 mL/hr over 60 Minutes Intravenous  Once 09/22/20 0934 09/22/20 1446   09/12/20 2200  ceFEPIme (MAXIPIME) 2 g in sodium chloride 0.9 % 100 mL IVPB        2 g 200 mL/hr over 30 Minutes Intravenous Every 12 hours 09/12/20 0732 09/14/20 2134   09/11/20 1400  ceFEPIme (MAXIPIME) 2 g in sodium chloride 0.9 % 100 mL IVPB  Status:  Discontinued        2 g 200 mL/hr over 30 Minutes Intravenous Every 8 hours 09/11/20 1126 09/12/20 0732   09/08/20 1000  vancomycin (VANCOREADY) IVPB 2000 mg/400 mL        2,000 mg 200 mL/hr over 120 Minutes Intravenous  Once 09/08/20 0857 09/08/20 1224   09/08/20 1000  ceFEPIme (MAXIPIME) 2 g in sodium chloride 0.9 % 100 mL IVPB  Status:  Discontinued        2 g 200 mL/hr over 30 Minutes Intravenous Every 12 hours 09/08/20 0857 09/11/20 1126   09/08/20 0856  vancomycin variable dose per unstable renal function (pharmacist dosing)  Status:  Discontinued         Does not apply See admin instructions 09/08/20 0857 09/09/20 0935   09/02/20 1600  cefTRIAXone (ROCEPHIN) 1 g in sodium chloride 0.9 % 100 mL IVPB  Status:  Discontinued        1 g 200 mL/hr over 30 Minutes Intravenous Every 24 hours 09/01/20 1811 09/02/20 0838   09/01/20 1800  fluconazole (DIFLUCAN) IVPB 400 mg  400 mg 50 mL/hr over 240 Minutes Intravenous  Once 09/01/20 1749 09/02/20 0603   09/01/20 1530  piperacillin-tazobactam (ZOSYN) IVPB 3.375 g        3.375 g 12.5 mL/hr over 240 Minutes Intravenous Every 8 hours 09/01/20 1514 09/05/20 2111   09/01/20 1000  levofloxacin (LEVAQUIN) tablet  250 mg  Status:  Discontinued        250 mg Oral Daily 08/31/20 1508 08/31/20 1735   08/31/20 1730  cefTRIAXone (ROCEPHIN) 1 g in sodium chloride 0.9 % 100 mL IVPB  Status:  Discontinued        1 g 200 mL/hr over 30 Minutes Intravenous Every 24 hours 08/31/20 1726 09/01/20 1513   08/31/20 1730  azithromycin (ZITHROMAX) 500 mg in sodium chloride 0.9 % 250 mL IVPB  Status:  Discontinued        500 mg 250 mL/hr over 60 Minutes Intravenous Every 24 hours 08/31/20 1726 09/02/20 0838       Time spent: 30 minutes    [name] ANP  [facility] 7 am - 330 pm/M-F for direct patient care and secure chat Please refer to Amion for contact info 92  days

## 2020-12-02 NOTE — Progress Notes (Signed)
Inpatient Rehabilitation-Admissions Coordinator   Reviewed case with PM&R medical director, Dr. Naaman Plummer. Pt is not a candidate for CIR. It does not appear that she can tolerate an intensive inpatient rehab program and it is unlikely she can make large gains in a short period of time. AC will not pursue CIR admit.  Please call if questions.   Raechel Ache, OTR/L  Rehab Admissions Coordinator  [phone] 12/02/2020 10:11 AM

## 2020-12-02 NOTE — Progress Notes (Addendum)
Physical Therapy Treatment Patient Details Name: Candace [name] MRN: [id number] DOB: August 31, 1951 Today's Date: 12/02/2020    History of Present Illness Pt 70 y.o. female with medical history significant for recent covid pna, obesity, colon cancer s/p colon resection with colostomy bag, HLD, NIDDM2, CKD3 presented to ED after her follow-up nurse advised her to present to ED for elevated HR. +afib, elevated troponins with demand ischemia,  CT scan is positive for bowel perforation with pneumomediastinum 11/03 exp lap with repair of perforated ulcer. 11/10 t/f to ICU- intubated/sedated/proned. Trach placed 11/24. CRRT off 12/8, restarted 12/10-1/5. Pt now on progressive care unti.    PT Comments    Pt making slow progress.  She was able to tolerate transfer via maximove to chair with several pillows and geomat to position.  Pt participated in exercises with cues for full ROM and controlled movements.  Spoke with nursing regarding transfer back to bed via hoyer.  Received message from case management that meeting with [employer] rep next Tuesday at 1430 to assess for a power wheel chair.  Will make note on PT schedule for a therapist to be appropriate.    Follow Up Recommendations  SNF;LTACH;Other (comment)  Post-Acute Rehab;Other (comment) Pt's family has stated they are not interested in pursuing SNF or LTACH, and pt is not candidate for CIR; It seems likely the family will chose home, and in that case, recommend maximal equipment (outlined below) and maximizing HHservices     Equipment Recommendations  Wheelchair (measurements PT);Wheelchair cushion (measurements PT);Hospital bed;Other (comment) (air mattress, hoyer lift, a custom WC 18x18 tilt in space with roho cushion)    Recommendations for Other Services       Precautions / Restrictions Precautions Precautions: Fall Precaution Comments: baseline colostomy; sacral wound with wound vac, trach with PMV    Mobility  Bed Mobility Overal bed  mobility: Needs Assistance Bed Mobility: Rolling Rolling: Mod assist;+2 for physical assistance;+2 for safety/equipment         General bed mobility comments: Mod A x 2 for rolling side to side for placement of maximove pad - cues and assist for reaching with UE and positioning of LE  Transfers Overall transfer level: Needs assistance               General transfer comment: Use of maximove for transfer to recliner chair to improve sitting tolerance for dialysis. Daughter present and assisting throughout.  Ambulation/Gait                 Stairs             Wheelchair Mobility    Modified Rankin (Stroke Patients Only)       Balance Overall balance assessment: Needs assistance Sitting-balance support: Bilateral upper extremity supported;Feet unsupported Sitting balance-Leahy Scale: Zero Sitting balance - Comments: Required assist to pull from supported sitting to unsupproted sitting in recliner       Standing balance comment: not assessed                            Cognition Arousal/Alertness: Awake/alert Behavior During Therapy: WFL for tasks assessed/performed Overall Cognitive Status: Impaired/Different from baseline Area of Impairment: Attention;Memory;Following commands;Safety/judgement;Awareness;Problem solving                   Current Attention Level: Selective Memory: Decreased short-term memory Following Commands: Follows one step commands with increased time;Follows one step commands consistently Safety/Judgement: Decreased awareness of safety;Decreased awareness of deficits Awareness:  Intellectual Problem Solving: Slow processing;Decreased initiation;Difficulty sequencing;Requires verbal cues;Requires tactile cues General Comments: Pt saying a few statements : "buttom hurts" asked if we could do therapy tomorrow (daughter present and encouraged today)      Exercises   Bil LE exercises per below: 1x5 LAQ with target  and cues for full ROM 1x5 ankle pumps AAROM 1x5 knee flexion stretch to tolerance with 5 sec hold   General Comments General comments (skin integrity, edema, etc.): VSS on RA with PMV in place. Positioned in recliner with multiple pillows and geomat to support. Maximove pad in place but with pillows or bed pad between maximove sling and pt. Discussed with pt and daughter that will likely not be able to completely eliminate pain as pt does have a large wound on sacrum. Placed PRAFO boots and educated daughters on application, use of "kick stand" to prevent external hip rotation, and ok to use in chair or bed.  Case manager notified PT that there is appointment set up with powerchair rep next Tuesday at 1430 and will need PT present (placed note on schedule)      Pertinent Vitals/Pain Pain Assessment: Faces Faces Pain Scale: Hurts little more Pain Location: bottom Pain Descriptors / Indicators: Grimacing Pain Intervention(s): Repositioned;Limited activity within patient's tolerance;Premedicated before session (Positioned as best a possible with multiple pillows; discussed likely would always have some pain b/c of wound)    Home Living                      Prior Function            PT Goals (current goals can now be found in the care plan section) Acute Rehab PT Goals Patient Stated Goal: be able to write, be able to be more independent PT Goal Formulation: With patient/family Time For Goal Achievement: 12/01/20 Potential to Achieve Goals: Fair Progress towards PT goals: Progressing toward goals    Frequency    Min 2X/week      PT Plan Current plan remains appropriate    Co-evaluation              AM-PAC PT "6 Clicks" Mobility   Outcome Measure                   End of Session   Activity Tolerance: Patient tolerated treatment well Patient left: in chair;with call bell/phone within reach;with family/visitor present Nurse Communication: Mobility  status;Need for lift equipment PT Visit Diagnosis: Muscle weakness (generalized) (M62.81);Difficulty in walking, not elsewhere classified (R26.2);Pain;Adult, failure to thrive (R62.7);Unsteadiness on feet (R26.81) Pain - Right/Left: Right Pain - part of body: Knee     Time: 2979-8921 PT Time Calculation (min) (ACUTE ONLY): 45 min  Charges:  $Therapeutic Exercise: 8-22 mins $Therapeutic Activity: 23-37 mins                     Abran Richard, PT Acute Rehab Services Pager [phone] [facility] [phone]     Karlton Lemon 12/02/2020, 5:25 PM

## 2020-12-02 NOTE — Progress Notes (Signed)
Nutrition Follow-up  DOCUMENTATION CODES:   Not applicable  INTERVENTION:   Resume Calorie Count; discussed with RN  Re-order Nepro Shake po TID, each supplement provides 425 kcal and 19 grams protein  Continue Magic cup TID with meals, each supplement provides 290 kcal and 9 grams of protein. Utilize as med pass as well  Liberalized diet back to Regular with Nectar Thick Liquids per MD  TF on hold per MD; recommend resuming if po intake inadequate post calorie count  Hold Juven for now; plan to resume pending results of calorie count  Continue Renal MVI daily   NUTRITION DIAGNOSIS:   Increased nutrient needs related to wound healing as evidenced by estimated needs.   GOAL:   Patient will meet greater than or equal to 90% of their needs  MONITOR:   PO intake,Supplement acceptance,Labs,Weight trends,TF tolerance,Skin,I & O's  REASON FOR ASSESSMENT:   Consult New TPN/TNA  ASSESSMENT:   70 year old female patient with h/o diabetes, stage III chronic kidney disease, atrial fibrillation, hypertension, dyslipidemia and colon cancer s/p colectomy and colostomy who was admitted with COVID on 08/23/20 along with acute kidney injury and hypoxemic respiratory failure.  Pt was discharged home on 2 to 3 L of oxygen but returned to the ER on 08/31/2020 with atrial fibrillation with RVR and hemorrhagic shock r/t perforated duodenal ulcer now s/p exploratory laparotomy on 11/3 with adhesiolysis and modified graham patch.   Discharge planning on-going. Noted family wishing for patient to be discharged to home with home health services  Diet progressed to Regular consistency with Nectar liquids on 2/01; previously on Dysphagia 2 with Nectar liquids. Pt is happy with diet progression and has been enjoying increased food options. Daughter reports pt is eating better but limited documentation of po intake in chart. Only 1 meal documented since diet advancement, pt ate 25% of dinner last  night.   Noted diet order changed to Carb Modified restriction today; discussed removal of diet restriction with MD given pt's po intake appears to remain inadequate and the Carb Modified diet is restrictive; while it restricts carbohydrates, it also restricts calories. MD ok with resuming liberalized diet.   Pt eating some Magic Cup and likes the [employer]. Pt also eating greek yogurt. As previously reported, pt does not normally swallow meat when eating; pt will chew it and spit it out and has down so for a while. However, pt has been enjoying the tilapia and the chicken salad and chews and swallows these. Daughter to continue to assist pt with ordering of meals; discussed other good sources of protein.   Nepro shakes were discontinued again; unsure how this happened. Daughter indicates that she has not seen a Nepro shake in a few days. Discussed with RN and MD; RD to reorder Nepro shakes TID between meals  Nocturnal TF discontinued today per MD; Cortrak tube remains in place.   Labs: CBGs 187-322, sodium 132 (L) Meds: cholecalciferol, ss novolog, megace   Diet Order:   Diet Order            Diet Carb Modified Fluid consistency: Nectar Thick; Room service appropriate? Yes  Diet effective now                 EDUCATION NEEDS:   Not appropriate for education at this time  Skin:  Skin Assessment: Skin Integrity Issues: Skin Integrity Issues:: Stage II DTI: sacrum Stage I: n/a Stage II: ear Incisions: abdomen  Last BM:  11/28/20 (200 ml via colostomy)  Height:   Ht Readings from Last 1 Encounters:  09/20/20 5\' 8"  (1.727 m)    Weight:   Wt Readings from Last 1 Encounters:  08/23/20 99.8 kg    Ideal Body Weight:     BMI:  Body mass index is 31.04 kg/m.  Estimated Nutritional Needs:   Kcal:  2000-2300kcal/day  Protein:  100-120g/day  Fluid:  UOP +1L   Kerman Passey MS, RDN, LDN, CNSC Registered Dietitian III Clinical Nutrition RD Pager and On-Call Pager  Number Located in [place]

## 2020-12-02 NOTE — Progress Notes (Signed)
[facility] Progress Note     Assessment:   1. HD dep't AKI.CRRT stopped 11/03/2020. Family desired ongoing full scope of care, Now on IHD- last done 2/2-  Tolerating poorly- removed only 350 with HD yest but interestingly does not seem that volume overloaded.  There is virtually no way that she will be stable enough for OP dialysis any time soon.  Her only option is LTAC right now-  dont know of insurance qualifications.  Right now have her on a TTS schedule - would be due today but making urine and numbers not terrible so will watch at least another day  1. H/o mixed shock: hemorrhagic and septic shock; stable 2. Anemia;  on ESA- will check iron stores again - sat is low-  ferr is high but I think will give iv iron 3. Perforated duodenal ulcer s/p exlap and graham patch placement 4. AHRF 2/2 ARDS s/p trach, per ccm 5. ARDS 2/2 COVID-19 Pneumonia-  trach 6. A.fib with RVR, recurrent this AM 7. Severe calorie malnutrition- huge decub 8. H/o stage IV colon cancer 9. Hypercalcemia- immobility, stable    - Complex dispo planning noted.  Dr. Kruska discussed with daughter that home dialysis isn't an option right out of hospital and the most appropriate dc would be LTACH or SNF but she is not willing to entertain those as options. She is convinced that patiets kidney function will improve and if not -  She thinks they can take her home and tend to all of her needs-  Eventually wanting to do home dialysis   Subjective:    At least 700 of UOP-  crt in the 1's-  But BUN went from 40's to 60's, giving some fluids -  Going to watch   Objective:   BP 130/75   Pulse (!) 112   Temp 98.7 F (37.1 C) (Oral)   Resp 20   Ht 5' 8" (1.727 m)   Wt 92.6 kg   SpO2 100%   BMI 31.04 kg/m   Intake/Output Summary (Last 24 hours) at 12/02/2020 0831 Last data filed at 12/02/2020 0711 Gross per 24 hour  Intake 343.87 ml  Output 3045 ml  Net -2701.13 ml   Weight change:   Physical  Exam: Gen: Chronically ill looking female lying on bed -  Is alert, nods indicating that she is OK HEENT: trach in place CVS: tachy, S1-S2 normal Resp: inc RR, trach; scattered ant rhonchi, no rales Abd: soft, nontender Ext:  min edema appreciated  ACCESS: R IJ TDC   Imaging: No results found.  Labs: BMET Recent Labs  Lab 11/26/20 0916 11/27/20 0115 11/28/20 0044 11/30/20 1248 12/01/20 0844 12/02/20 0226  NA 134* 131* 132* 131* 132* 132*  K 4.3 4.6 3.9 3.4* 3.5 3.7  CL 95* 94* 95* 96* 95* 95*  CO2 25 20* 24 23 24 23  GLUCOSE 105* 229* 224* 294* 354* 339*  BUN 61* 103* 47* 108* 55* 66*  CREATININE 2.59* 2.78* 1.56* 1.70* 1.26* 1.44*  CALCIUM 10.4* 9.5 9.4 10.0 9.4 10.1  PHOS 6.6* 7.2*  --  4.5  --  4.3   CBC Recent Labs  Lab 11/26/20 0916 11/30/20 1248  WBC 15.2* 16.4*  HGB 8.9* 8.3*  HCT 29.5* 27.2*  MCV 86.8 85.5  PLT 410* 369    Medications:    . apixaban  2.5 mg Oral BID  . chlorhexidine gluconate (MEDLINE KIT)  15 mL Mouth Rinse BID  . Chlorhexidine Gluconate Cloth    6 each Topical Daily  . cholecalciferol  2,000 Units Oral Daily  . clonazepam  0.25 mg Oral QHS  . collagenase   Topical BID  . darbepoetin (ARANESP) injection - DIALYSIS  100 mcg Intravenous Q Sat-HD  . feeding supplement (NEPRO CARB STEADY)  1,000 mL Per Tube Q24H  . feeding supplement (PROSource TF)  45 mL Per Tube TID  . guaiFENesin  15 mL Oral Q12H  . insulin aspart  0-6 Units Subcutaneous TID WC  . insulin aspart  2 Units Subcutaneous 2 times per day  . mouth rinse  15 mL Mouth Rinse QID  . megestrol  200 mg Oral QID  . metoprolol tartrate  50 mg Oral BID  . midodrine  40 mg Oral TID WC  . multivitamin  1 tablet Oral QHS  . nutrition supplement (JUVEN)  1 packet Oral BID BM  . pantoprazole sodium  40 mg Oral BID  . sodium chloride flush  10-40 mL Intracatheter Q12H       A , MD 12/02/2020, 8:31 AM   

## 2020-12-02 NOTE — TOC Progression Note (Addendum)
Transition of Care [facility]) - Progression Note    Patient Details  Name: Candace [name] MRN: [id number] Date of Birth: [birth year]  Transition of Care [facility] CM/SW [place], RN Phone Number: 12/02/2020, 11:40 AM  Clinical Narrative:    Case management met with the patient and daughter, [name], at the bedside regarding transitions of care.  The patient's daughter, [name] was able to express patient care needs and concerns at the meeting with [name], NP, Aldona Bar, RN assistant director, Charlann Boxer, director, Madilyn Fireman, MSW and myself were present in the room.  The patient is making progress on oral intake of diet and is presently taking a regular diet.  Tube feed orders are being changed to that the Cortrak can be discontinued at a later date when able.  The daughter verbalized that the patient is more anxious at night and during hemodialysis sessions at the hospital when family is not present since she is not able to communicate and verbalize needs readily.  The nursing staff is placing a soft call bell in the room so that the patient is able to call for the nurse as needed and the nursing staff will continue to make scheduled rounds on the patient to assist with needs and positioning.  Spoke with Roney Marion, PT in the room yesterday and she suggested a power wheelchair for the patient.  I called and left a message for Romilda Garret, ATP specialist at [phone] to request a consult for wheelchair fitting and consult - as noted in Roney Marion PT note.  [facility] home [facility] was active with the patient prior to this admission.  I sent a message to [place], [facility] [place] to continue to follow the patient for home health needs upon discharge from the hospital including RN, PT, OT, aide, and MSW.  CM and MSW will continue to follow the patient for transitions of care needs.  12/02/2020 - Josh Cadle, ATP specialist with [employer] called back and referral made for possible power  wheelchair fitting for the patient.  I paged Roney Marion, PT to make her aware of the referral.  Josh Cadle, ATP specialist states that he plans to see the patient next Tuesday, 12/07/2020 at 2:30 pm for fitting.  I messaged the physical therpist, Maggie Font, today to make her aware of the probable appointment next week.  I called and spoke with the daughter, [name], and made her aware that Merrily Pew, PT with [employer] will visit on Tuesday, 12/07/2020 for wheelchair fitting and I was faxing clinicals and insurance information to assist - daughter agreed and gave permission for this.  Expected Discharge Plan: [place] Barriers to Discharge: Continued Medical Work up  Expected Discharge Plan and Services Expected Discharge Plan: [place] In-house Referral: Clinical Social Work Discharge Planning Services: CM Consult Post Acute Care Choice: Durable Medical Equipment,Home Health,Dialysis Living arrangements for the past 2 months: Single Family Home                 DME Arranged: N/A DME Agency: NA       HH Arranged: NA HH Agency: NA         Social Determinants of Health (SDOH) Interventions    Readmission Risk Interventions Readmission Risk Prevention Plan 11/19/2020  Transportation Screening Complete  Medication Review Press photographer) Complete  PCP or Specialist appointment within 3-5 days of discharge Complete  HRI or Home Care Consult Complete  SW Recovery Care/Counseling Consult Complete  Palliative  Care Screening Complete  Skilled Nursing Facility Complete  Some recent data might be hidden

## 2020-12-02 NOTE — Progress Notes (Signed)
  Speech Language Pathology Treatment: Dysphagia;Cognitive-Linquistic  Patient Details Name: Candace [name] MRN: [id number] DOB: Oct 17, 1951 Today's Date: 12/02/2020 Time: 2876-8115 SLP Time Calculation (min) (ACUTE ONLY): 34 min  Assessment / Plan / Recommendation Clinical Impression  Pt was seen for treatment with her daughter present. Pt's daughter reported that the pt has been tolerating the current diet without overt s/sx of aspiration. She stated that the pt consumes thin water and thin iced tea. Clarification was provided regarding SLP's recommendation for nectar thick liquids with the exception of thin water when she is upright, and the rationale for this was provided. Pt's daughter verbalized understanding and all her questions were answered to her satisfaction. The role of the SLP was also clarified as the pt's daughter was under the impression that the SLP would determine the pt's readiness for a size 4 trach. The PMSV was in place upon SLP's entry and RN reported that she continues to tolerate it well. Pt's breath support continues to be reduced and vocal quality was hoarse with reduced vocal intensity. Both parties were educated regarding the speech mechanism and understanding was verbalized. Pt was able to progressively increase breath support and vocal intensity during structured task such as counting and sustained phonation. With verbal prompts to improve breath support, pt produced short phrases with improved vocal intensity and speech intelligibility. Pt's daughter stated that she will continue to reinforce this between sessions. Pt tolerated dual consistency boluses of cereal with milk without overt s/sx of aspiration. Pt inconsistently exhibited throat clearing and coughing with thin liquids via straw when she was reclined; she could not tolerate a more upright position due to c/o pain from wound. It is recommended that her current diet of regular texture solids and nectar thick liquids be  continued with allowance of thin water when she is fully right. Pt and her daughter verbalized agreement with this recommendation. SLP will continue to follow pt.    HPI HPI: 70 year old female who was previously diagnosed with Covid 08/23/2020.  Admitted 11/2 and intubated with AF-RVR, found to have a perforated duodenal underwent exploratory laparotomy with Phillip Heal patch placement 11/3, Intubated for 20 days until 11/23 then trach placed. Prolonged ventilation, until 1/17. Profoundly deconditioned.      SLP Plan  Continue with current plan of care       Recommendations  Diet recommendations: Regular;Nectar-thick liquid (Thin water when upright) Liquids provided via: Cup;Straw Medication Administration: Whole meds with puree Supervision: Staff to assist with self feeding;Full supervision/cueing for compensatory strategies Compensations: Slow rate;Small sips/bites Postural Changes and/or Swallow Maneuvers: Seated upright 90 degrees      Patient may use Passy-Muir Speech Valve: During all waking hours (remove during sleep);During PO intake/meals PMSV Supervision: Full MD: Please consider changing trach tube to : Cuffless;Smaller size         Plan: Continue with current plan of care       Tome Wilson I. Hardin Negus, [place], [place] Office number [phone] Pager [phone]                Horton Marshall 12/02/2020, 11:38 AM

## 2020-12-02 NOTE — Progress Notes (Signed)
Pharmacy Antibiotic Note  Candace [name] is a 70 y.o. female admitted on 08/31/2020 with pneumonia.  Pharmacy has been consulted for Cefazolin dosing.  The patient has AKI and has transitioned to IHD - last 2/1. Current dosing remains appropriate. 7d course set to end on 2/4.   Plan: Continue Cefazolin 1 gm IV q24hr x 7 days per CCM Monitor dialysis schedule and clinical status  Height: 5\' 8"  (172.7 cm) Weight:  (Unable to wt) IBW/kg (Calculated) : 63.9  Temp (24hrs), Avg:98.8 F (37.1 C), Min:98.7 F (37.1 C), Max:98.9 F (37.2 C)  Recent Labs  Lab 11/26/20 0916 11/27/20 0115 11/28/20 0044 11/30/20 1248 12/01/20 0844 12/02/20 0226  WBC 15.2*  --   --  16.4*  --   --   CREATININE 2.59* 2.78* 1.56* 1.70* 1.26* 1.44*    Estimated Creatinine Clearance: 43.9 mL/min (A) (by C-G formula based on SCr of 1.44 mg/dL (H)).    No Known Allergies  Antimicrobials: Cefazolin 1/28 >> (2/4)  1/24 TA - MSSA  Thank you for allowing pharmacy to be a part of this patient's care.  Alycia Rossetti, PharmD, BCPS Clinical Pharmacist Clinical phone for 12/02/2020: [phone] 12/02/2020 10:03 AM   **Pharmacist phone directory can now be found on [email or web address] ([email or web address]).  Listed under [place].

## 2020-12-03 DIAGNOSIS — I4891 Unspecified atrial fibrillation: Secondary | ICD-10-CM | POA: Diagnosis not present

## 2020-12-03 LAB — GLUCOSE, CAPILLARY
Glucose-Capillary: 145 mg/dL — ABNORMAL HIGH (ref 70–99)
Glucose-Capillary: 168 mg/dL — ABNORMAL HIGH (ref 70–99)
Glucose-Capillary: 174 mg/dL — ABNORMAL HIGH (ref 70–99)
Glucose-Capillary: 230 mg/dL — ABNORMAL HIGH (ref 70–99)
Glucose-Capillary: 237 mg/dL — ABNORMAL HIGH (ref 70–99)
Glucose-Capillary: 238 mg/dL — ABNORMAL HIGH (ref 70–99)
Glucose-Capillary: 267 mg/dL — ABNORMAL HIGH (ref 70–99)

## 2020-12-03 LAB — RENAL FUNCTION PANEL
Albumin: 2.1 g/dL — ABNORMAL LOW (ref 3.5–5.0)
Anion gap: 13 (ref 5–15)
BUN: 75 mg/dL — ABNORMAL HIGH (ref 8–23)
CO2: 24 mmol/L (ref 22–32)
Calcium: 10.2 mg/dL (ref 8.9–10.3)
Chloride: 100 mmol/L (ref 98–111)
Creatinine, Ser: 1.35 mg/dL — ABNORMAL HIGH (ref 0.44–1.00)
GFR, Estimated: 43 mL/min — ABNORMAL LOW (ref >60)
Glucose, Bld: 194 mg/dL — ABNORMAL HIGH (ref 70–99)
Phosphorus: 4.5 mg/dL (ref 2.5–4.6)
Potassium: 3.5 mmol/L (ref 3.5–5.1)
Sodium: 137 mmol/L (ref 135–145)

## 2020-12-03 MED ORDER — MIDODRINE HCL 5 MG PO TABS
40.0000 mg | ORAL_TABLET | ORAL | Status: DC
  Administered 2020-12-06 (×3): 40 mg via ORAL
  Filled 2020-12-03 (×2): qty 8

## 2020-12-03 MED ORDER — MUSCLE RUB 10-15 % EX CREA
TOPICAL_CREAM | CUTANEOUS | Status: DC | PRN
  Filled 2020-12-03: qty 85

## 2020-12-03 NOTE — Progress Notes (Signed)
NAMEAvory [name], MRN:  [id number], DOB:  10/29/1951, LOS: 93 ADMISSION DATE:  08/31/2020, CONSULTATION DATE:  09/07/2020 REFERRING MD:  Dr Candace [name], CHIEF COMPLAINT:  Acute resp failure  Brief History   70 year old female who was previously diagnosed with Covid 08/23/2020.  Admitted 11/2 with AF-RVR, found to have a perforated duodenal underwent exploratory laparotomy with Phillip Heal patch placement 11/3.  Past Medical History  Covid pneumonia Atrial fibrillation CKD stage III Diabetes mellitus Hypertension Colon cancer Hyperlipidemia  Significant Hospital Events   11/2 Admitted  11/3 OR with findings of perforated duodenal ulcer 11/10 progressive hemorrhagic shock, intubated, transfused, pressors, proned; started on CRRT in PM 11/03 Exploratory laparotomy, Phillip Heal patch, lysis of adhesion for duodenal ulceration postop day 6 11/16 Extubated. Re-intubated overnight due to respiratory distress and hypoxia with decreased mentation 11/18 Bronch, cultures sent 11/19 Hgb down getting blood 11/24 Spiked fever resume empirical antimicrobial therapy 11/26 Hemorrhagic shock, hgb 5.6, increased pressors, CT A/P  11/30 Per palliative "[name] expresses understanding that patient is unfortunately very fragile despite ongoing intensive medical treatment and full mechanical support. She indicates that the family wants to continue with all current interventions despite potential outcomes". 12/08 CRRT discontinued due to clotting 12/09 Family requested transfer to tertiary care [facility]). [facility] denied transfer  12/10 CRRT restarted. Episodes of tachycardia, tachypnea that seem to improve with pain management 12/11 Back in shock. Pressor requirements up. CXR worse. ABX resumed 12/12 Still requiring inc pressors. Had hypoglycemic event. Basal insulin dosing adjusted 12/13 Pressor requirements better. Now hyperglycemic. Re-adjusted Glycemic control  12/14 Changed dilaudid to1/2 dosing from day further. D/c  vasopressin.  Goals of care reconfirmed with daughter.  Patient continues to desire aggressive care.  Not open to discussing any other option, patient family continues to be hopeful that she will be discharged to home with full recovery in spite of multiple attempts by staff to prepare them that this is unlikely scenario 12/15 Dilaudid discontinued sending cortisol for ongoing pressor dependence 12/16 Serum cortisol <20, added stress dose steroids.  PRN Dilaudid, attempting not go back on Dilaudid infusion 12/17 Developed worsening tachycardia during the evening hours received initially IV albumin, followed by resuming IV Dilaudid with question of suboptimal pain control.  Currently looks better back on Dilaudid drip.  We have been able to wean pressors after adding stress dose steroids; near arrest - bradycardia, better with atropine  12/19 Afebrile . Remains on dilaudid and heparin gtt, dilaudid gtt increased overnight for concern of pain / ongoing tachycardia, no other events . NE and precedex off 12/17. Ongoing CRRT- even UF, dosing lokelmia this morning 12/20 On CRRT.  Renal plans for HD cath and moving to HD. Getting wound care  12/23 -No vent weaning per RT, remains on full support, #8 trach in place but previously had #6, no documentation of change noted in chart Afebrile / WBC 20.4  Vent - 30% FiO2, PEEP 5 Glucose range 176-212 I/O 465 ml stool, 3.6L removed with HD, -1.1L in last 24 hours  RN reports ongoing periods of tachypnea / vent dyssynchrony that responds to dilaudid   12/24 - renal stopping CRRT today and plans HD 10/24/20 . 40% fio2 on vent via Trach. TAchypenic and tachycardic. Afebrile but wbc up to 27.6K. On TF. Onn heparin gtt  12/25 - Back on CRRT. On vent via trach at 40% fio2, On scheduled dilaudid as add on to oxy. Per family request 12/24 - they felt scheduled oxy was not adequate and patient was showing  signs of opioid withdrawal.  Patient also had  worsening SIRS/sepsis  syndrome. Had fever, rising wbc, worsening RR 40-60 and HR 140s sinus-> started On abx yesterday. Fever some better today. WBC plateau at 28,.5K. On new levophed -> signifanct improvement in HR 77 and RR t0 20. On heparin gtt.  On precedex gtt. On levophed gtt 103mcg wthi midodrine. On TF 12/30 Remains on CRRT with intermittent pressor requirements. Ethics consult placed evening of 12/29. Ethics rec time trial of CRRT 12/31 failed SBT with RR 40s. Several conversations between care tam and family, who are upset RE plan of care 1/1 back on pressors  1/4: On pressors, keeping even on CVVHD, HGB drop to 6.9, transfused 1 unit. Improving mental status 1/10 remains on low dose levophed 86mcg 1/11 off levo, attempting HD with UF for first time.  -ethics consulted 12/30-- recs for time limited trial of CRRT, please see separate Ethics consult note for full details. Futility policy reviewed with family by ethics. Ongoing meetings with Dr. Hulen Skains with nursing administration and family. The ultimate goal is to transition her safely to HD from CRRT. 11/16/2020 again will be placed on intermittent hemodialysis. 11/16/2018 wheeze criteria for transition to progressive care since she has been off the ventilator for 72 hours. With an order for transfer to progressive care. 11/18/2020 is doing well on progressive care unit. 1/24 copious secretions 1/27 downsized to #6 cuffed 1/29 rapid response called during HD for SVT  Consults:  Cardiology [facility] Nephrology  Ethics  Procedures:  R PICC 11/5 >> A line 11/9 >> out ETT 11/9 > 11/16, 11/16 >> 09/21/2020, 09/21/2020 tracheostomy>> Lt  CVL 11/9 >> R IJ trialysis >> out HD catheter 12/1 >>12/20 12/21 - 14.5 Fr, 23 cm right IJ tunneled hemodialysis catheter placement.  Removal of indwelling subclavian catheter.   Significant Diagnostic Tests:  11/3 CT abd/ pelvis > 1. Positive for bowel perforation: Pneumoperitoneum and intermediate density free fluid in the  abdomen. Prior total colectomy. The specific site of perforation is unclear-oral contrast present to the proximal jejunum has not obviously leaked. Note that there may be small bowel loops adherent to the ventral abdominal wall along the greater curve of the stomach. 2. Extensive bilateral lower lung pneumonia. No pleural effusion. 3. Other abdominal and pelvic viscera are stable since 2015, including bilateral adrenal adenomas. Chronic renal parapelvic cysts. 4. Aortic Atherosclerosis 11/3 TTE > EF 70-75%, RV not well visualized, mildly reduced RV systolic function 94/85 CT chest/ abd/ pelvis> 1. Interval progression of diffuse bilateral hazy ground-glass airspace opacities with more focal areas of consolidation at the lung bases 2. Trace bilateral pleural effusions. 3. Postsurgical changes the abdomen as detailed above. No evidence for a postoperative abscess, however evaluation is limited by lack of IV contrast. 4. There is a 1.9 cm cystic appearing lesion located in the pancreatic body. This was not present on the patient's CT from 2015.  Follow-up with an outpatient contrast enhanced MRI is recommended. 5. The endometrial stripe appears diffusely thickened. Follow-up with pelvic ultrasound is recommended. Aortic Atherosclerosis 11/14 LE doppler studies > + DVT of right posterior tibial and peroneal vein, +dVT of left posterior tibial vein  11/26 CT ABD PEL > liver unremarkable, distended gallbladder with layering tiny gallstones versus sludge, no duct dilatation, mild hyperdensity right upper pole renal collecting system new.  No evidence of retroperitoneal bleeding.  Multifocal lower lobe predominant pulmonary infiltrates/pneumonia.  Small left pleural effusion. 1/16 Venous duplex RUE >> negative Micro Data:  11/10  MRSA PCR > neg 11/10 BC x 2 > neg 09/22/2020 blood cultures x2>> S epi.  09/22/2020 sputum culture>> MRSA Blood 12/1 >> negative. S.epi 1 out of 2, likely contaminant BCx 2  12/12 >> neg xxx Trach aspirate 12/24 - FEW STAPHYLOCOCCUS AUREUS  Blood 12/24 > Negative  Blood 1/2: neg Sputum 1/4: MSSA resp 1/24 MSSA  Antimicrobials:  azithro 11/2 >11/3 Ceftriaxone 11/2  Fluconazole 11/3 Zosyn 11/3 >> 11/7 Vanc 11/10 off Cefepime 11/10 > 11/16 09/22/2020 vancomycin for MRSA PNA >> 12/2 xxxx Vanc 12/11> 12/17 Zosyn 12/11> 12/17 xxxx vanc 12/24 (staph resp cutlure) >> 12/28 Zosyn 12/24 - 12/27 Cefazolin 12/27 >>1/1 linezolid 1/2 eraxis 1/2  Meropenem 1/2 > 1/8 1/28 Ancef >>  Subjective:   Doing ok. No secretions. Swallowing a bit difficult but doing ok.  Objective   Blood pressure 117/69, pulse (!) 105, temperature 98.6 F (37 C), resp. rate 18, height 5\' 8"  (1.727 m), weight 92.6 kg, SpO2 100 %.    FiO2 (%):  [21 %] 21 %   Intake/Output Summary (Last 24 hours) at 12/03/2020 1900 Last data filed at 12/03/2020 1758 Gross per 24 hour  Intake 2386.46 ml  Output 1100 ml  Net 1286.46 ml   Filed Weights   Physical Exam  Awake but frail appearing elderly woman Tracheostomy #6 , cuffed, PM valve Chest no accessory muscle use Abdomen soft, nontender Extremities -no edema, good pulses    Resolved problems:  Previously hemorrhagic shock requiring PRBCs, Septic shock, MRSA pneumonia s/p 8 days abx Complicated by MRSA healthcare associated pneumonia (treated) Peritonitis and perforated ulcer post repair 09/01/20   Assessment:  Candace [name] is a 70 y.o. with history of COVID 19 infection requiring hospitalization in October 2021 with current admission for hemorrhagic shock secondary to perforated gastric ulcer requiring surgical intervention and Engineer, structural. 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

## 2020-12-03 NOTE — Progress Notes (Signed)
Physical Therapy Wound Treatment Patient Details  Name: Candace [name] MRN: [id number] Date of Birth: [birth year]  Today's Date: 12/03/2020 Time:  -     Subjective  Subjective: Pt/pt daughter would like her premedicated 1.5 hour before treatment Patient and Family Stated Goals: heal wound Date of Onset:  (unknown) Prior Treatments: prior hydro and Dakins  Pain Score: premedicated 1.5 hours before session.  Pain 2-4/10  Wound Assessment  Pressure Injury 09/25/20 Sacrum Bilateral;Medial Deep Tissue Pressure Injury - Purple or maroon localized area of discolored intact skin or blood-filled blister due to damage of underlying soft tissue from pressure and/or shear. Purple, non-blanchable, b (Active)  Wound Image   11/29/20 1702  Dressing Type Other (Comment) 12/03/20 1405  Dressing Changed 12/03/20 1405  Dressing Change Frequency Monday, Wednesday, Friday 12/03/20 1405  State of Healing Other (Comment) 12/03/20 1405  Site / Wound Assessment Clean;Bleeding;Red;Yellow;Black 12/03/20 1405  % Wound base Red or Granulating 20% 12/03/20 1405  % Wound base Yellow/Fibrinous Exudate 50% 12/03/20 1405  % Wound base Black/Eschar 20% 12/03/20 1405  % Wound base Other/Granulation Tissue (Comment) 10% 12/03/20 1405  Peri-wound Assessment Intact 12/01/20 1722  Wound Length (cm) 14 cm 12/01/20 1722  Wound Width (cm) 12 cm 12/01/20 1722  Wound Depth (cm) 6 cm 12/01/20 1722  Wound Surface Area (cm^2) 168 cm^2 12/01/20 1722  Wound Volume (cm^3) 1008 cm^3 12/01/20 1722  Tunneling (cm) 0 12/01/20 1722  Undermining (cm) 0 12/01/20 1722  Margins Unattached edges (unapproximated) 12/03/20 1405  Drainage Amount Minimal 12/03/20 1405  Drainage Description Serosanguineous 12/03/20 1405  Treatment Cleansed;Negative pressure wound therapy;Debridement (Selective) 12/03/20 1405     Negative Pressure Wound Therapy Sacrum (Active)  Last dressing change 12/01/20 12/01/20 1722  Site / Wound Assessment Clean;Dry  12/03/20 0400  Peri-wound Assessment Intact 12/03/20 0400  Size see above 12/01/20 1722  Wound filler - Black foam 2 12/01/20 1722  Wound filler - White foam 0 12/01/20 1722  Wound filler - Nonadherent 0 12/01/20 1722  Wound filler - Gauze 0 12/01/20 1722  Cycle Continuous 12/03/20 0400  Target Pressure (mmHg) 125 12/03/20 0400  Instillation Volume 26 mL 12/01/20 1722  Instillation Solution Normal Saline 12/03/20 0400  Instillation Soak Time 10 minutes 12/03/20 0400  Instillation Therapy Time 3.5 hours 12/03/20 0400  Canister Changed No 12/02/20 2000  Dressing Status Intact 12/02/20 2000  Drainage Amount Moderate 12/01/20 1722  Output (mL) 75 mL 12/03/20 0400      Selective Debridement Selective Debridement - Location: sacrum Selective Debridement - Tools Used: Forceps;Scalpel Selective Debridement - Tissue Removed: necrotic adipose, eschar   Wound Assessment and Plan  Wound Therapy - Assess/Plan/Recommendations Wound Therapy - Clinical Statement: Wound demonstrates incremental progress with VAC. Able to debride significant amount of yellow slough/necrotic adiposetoday with improved pain control from pt (pain medication given 1.5 hours prior). Continue plan. Wound Therapy - Functional Problem List: Global weakness and immobility Factors Delaying/Impairing Wound Healing: Diabetes Mellitus;Immobility;Multiple medical problems;Other (comment) (poor nutrition) Hydrotherapy Plan: Debridement;Pulsatile lavage with suction;Patient/family education;Dressing change Wound Therapy - Frequency: 6X / week Wound Therapy - Follow Up Recommendations: Skilled nursing facility Wound Plan: see above  Wound Therapy Goals- Improve the function of patient's integumentary system by progressing the wound(s) through the phases of wound healing (inflammation - proliferation - remodeling) by: Decrease Necrotic Tissue to: 50% Decrease Necrotic Tissue - Progress: Progressing toward goal Increase Granulation  Tissue to: 50% Increase Granulation Tissue - Progress: Progressing toward goal Goals/treatment plan/discharge plan were made with and agreed upon  by patient/family: Yes Time For Goal Achievement: 7 days Wound Therapy - Potential for Goals: Fair  Goals will be updated until maximal potential achieved or discharge criteria met.  Discharge criteria: when goals achieved, discharge from hospital, MD decision/surgical intervention, no progress towards goals, refusal/missing three consecutive treatments without notification or medical reason.  GP   12/03/2020  Ginger Carne., PT Acute Rehabilitation Services [phone]  (pager) [phone]  (office)  Candace [name] 12/03/2020, 2:54 PM

## 2020-12-03 NOTE — Progress Notes (Signed)
Occupational Therapy Treatment Patient Details Name: Candace [name] MRN: [id number] DOB: 08-31-1951 Today's Date: 12/03/2020    History of present illness Pt 70 y.o. female with medical history significant for recent covid pna, obesity, colon cancer s/p colon resection with colostomy bag, HLD, NIDDM2, CKD3 presented to ED after her follow-up nurse advised her to present to ED for elevated HR. +afib, elevated troponins with demand ischemia,  CT scan is positive for bowel perforation with pneumomediastinum 11/03 exp lap with repair of perforated ulcer. 11/10 t/f to ICU- intubated/sedated/proned. Trach placed 11/24. CRRT off 12/8, restarted 12/10-1/5. Pt now on progressive care unti.   OT comments  Pt progressing towards established OT goals. Pt very motivated and agreeable to participate in self feeding task and exercises; request to defer OOB at this time due to pain after hydro therapy. Optimizing positioning in bed for semi chair position; slight off loading of hips to left. Pt requiring Min hand over hand to bring sandwich and cup to mouth. Pt able to self feed herself while pudding cup with use of R hand to hold cup and then L hand to maintain grasp on built up handled spoon. Pt also participating in strengthening with level one theraband. Educating pt and Candace [name] on repositioning tips for bed mobility and sleep. Continue to recommend dc to SNF/LTACH and will continue to follow acutely as admitted.    Follow Up Recommendations  LTACH;SNF;Supervision/Assistance - 24 hour;Other (comment) (Max HH services if continues to decline SNF/LTACH)    Financial risk analyst (measurements OT);Wheelchair cushion (measurements OT);Hospital bed;Other (comment) (tilt in space power chair; air cushion and mattress for skin integrity)    Recommendations for Other Services PT consult    Precautions / Restrictions Precautions Precautions: Fall Precaution Comments: baseline colostomy; sacral wound  with wound vac, trach with PMV       Mobility Bed Mobility Overal bed mobility: Needs Assistance Bed Mobility: Rolling Rolling: Mod assist;+2 for physical assistance         General bed mobility comments: Max A +2 for repositioning.  Transfers                 General transfer comment: Defer due to pain at buttocks    Balance                                           ADL either performed or assessed with clinical judgement   ADL Overall ADL's : Needs assistance/impaired Eating/Feeding: Set up;Supervision/ safety;Bed level;Minimal assistance Eating/Feeding Details (indicate cue type and reason): Optimizing upright posture in bed. Having pt hold pudding cup with R hand and then pt grasping built up hanlde at spoon to scoop and bring to mouth; pt eating whole pudding cup with supervision.. Pt requiring Min A to maintain grasp on sandwich and bring to mouth. Min A for managing water cup with straw                                   General ADL Comments: Focused session on family education, self feeding, HEP, and repositioning in bed.     Vision   Vision Assessment?: No apparent visual deficits   Perception     Praxis      Cognition Arousal/Alertness: Awake/alert Behavior During Therapy: WFL for tasks assessed/performed Overall Cognitive Status: Impaired/Different from baseline  Area of Impairment: Attention;Memory;Following commands;Safety/judgement;Awareness;Problem solving                   Current Attention Level: Selective Memory: Decreased short-term memory Following Commands: Follows one step commands with increased time;Follows one step commands consistently Safety/Judgement: Decreased awareness of safety;Decreased awareness of deficits Awareness: Intellectual;Emergent Problem Solving: Slow processing;Decreased initiation;Difficulty sequencing;Requires verbal cues;Requires tactile cues General Comments: Pt sustaining  attention to perform self feeding task. Demonstrating increasing problem solving and adapt her grasp and manage spoon despite fatigue. Slightly tangiental in thought and conversation.        Exercises Exercises: General Upper Extremity General Exercises - Upper Extremity Shoulder Horizontal ADduction: Strengthening;Left;Right;10 reps;Supine;Theraband (reaching across body) Theraband Level (Shoulder Horizontal Adduction): Level 1 (Yellow) Elbow Extension: Strengthening;Right;Left;10 reps;Supine;Theraband Theraband Level (Elbow Extension): Level 1 (Yellow)   Shoulder Instructions       General Comments Candace [name] present throughout    Pertinent Vitals/ Pain       Pain Assessment: Faces Faces Pain Scale: Hurts little more Pain Location: bottom Pain Descriptors / Indicators: Grimacing Pain Intervention(s): Monitored during session;Limited activity within patient's tolerance;Repositioned  Home Living                                          Prior Functioning/Environment              Frequency  Min 2X/week        Progress Toward Goals  OT Goals(current goals can now be found in the care plan section)  Progress towards OT goals: Progressing toward goals  Acute Rehab OT Goals Patient Stated Goal: be able to write, be able to be more independent OT Goal Formulation: With patient/family Time For Goal Achievement: 12/10/20 Potential to Achieve Goals: Fair ADL Goals Pt Will Perform Grooming: with mod assist;bed level;sitting Pt Will Perform Upper Body Bathing: with max assist;sitting;bed level Pt Will Perform Upper Body Dressing: with set-up;with supervision;sitting Pt Will Transfer to Toilet: with max assist;with +2 assist;squat pivot transfer;bedside commode Pt Will Perform Toileting - Clothing Manipulation and hygiene: with min guard assist;sitting/lateral leans;sit to/from stand Pt Will Perform Tub/Shower Transfer: with supervision;ambulating;shower  seat;rolling walker Pt/caregiver will Perform Home Exercise Program: Increased strength;Both right and left upper extremity;With written HEP provided;With theraband Additional ADL Goal #1: Pt will sustain attention to ADL task with min VCs Additional ADL Goal #2: Pt will sit EOB for 5 mins with mod A for trunk support with VSS Additional ADL Goal #3: Pt will complete bed mobility with mod A as precursor to ADLs  Plan Discharge plan remains appropriate    Co-evaluation                 AM-PAC OT "6 Clicks" Daily Activity     Outcome Measure   Help from another person eating meals?: A Lot Help from another person taking care of personal grooming?: A Lot Help from another person toileting, which includes using toliet, bedpan, or urinal?: Total Help from another person bathing (including washing, rinsing, drying)?: Total Help from another person to put on and taking off regular upper body clothing?: Total Help from another person to put on and taking off regular lower body clothing?: Total 6 Click Score: 8    End of Session    OT Visit Diagnosis: Unsteadiness on feet (R26.81);Other abnormalities of gait and mobility (R26.89);Muscle weakness (generalized) (M62.81);Other symptoms and signs involving cognitive function  Activity Tolerance Patient tolerated treatment well   Patient Left with call bell/phone within reach;with family/visitor present;in bed   Nurse Communication Mobility status;Other (comment) (Repositioned to left sidelying)        Time: 8295-6213 OT Time Calculation (min): 54 min  Charges: OT General Charges $OT Visit: 1 Visit OT Treatments $Self Care/Home Management : 38-52 mins $Therapeutic Exercise: 8-22 mins  Toshika Parrow MSOT, OTR/L Acute Rehab Pager: [phone] Office: [place] 12/03/2020, 3:25 PM

## 2020-12-03 NOTE — Progress Notes (Signed)
Patient refused to wear prevalon boots.

## 2020-12-03 NOTE — Progress Notes (Addendum)
[facility] PROGRESS NOTE  Candace [name] JWJ:[id number] DOB: 03/09/1951 DOA: 08/31/2020 PCP: Candace Greenhouse, MD            Status: Remains inpatient appropriate because:Ongoing diagnostic testing needed not appropriate for outpatient work up, Unsafe d/c plan, IV treatments appropriate due to intensity of illness or inability to take PO and Inpatient level of care appropriate due to severity of illness   Dispo: The patient is from: Home              Anticipated d/c is to: Home with home health               Anticipated d/c date is: > 3 days              Patient currently is not medically stable to d/c.  Barriers to discharge: Continues to require HD- unable to sit in chair/get in [facility] for OP HD-also requiring Cortrak for nocturnal feeds; still has trach with copiuous secretions at times  Considerations for home discharge: 1. Patient will need to continue wound VAC and current wound VAC is apparently not available in the home setting 2. It is likely she will need a specialty bed.  We are currently transitioning back to the [facility] rotational bed which will allow for better in bed mobility including the ability to "stand" with PT in the bed without actually getting patient out of bed 3. Likely would need to be decannulated to minimize other care issues after discharge 4. Would need to be able to either mobilize for outpatient hemodialysis or be a candidate for home dialysis 5. Would not have aggressive PT OT available in the home setting that she can achieve during hospitalization 6. Patient will have family members that are CNAs managing her after discharge but this would be 24/7 requirement and this could be an overwhelming issue once patient is actually home  Code Status: Full Family Communication: 2/3 daughter Candace [name] at bedside-extensive conversation held at bedside with daughter to include myself, Retail buyer, Editor, commissioning, and [facility] personnel regarding expectations  for IP care and dc planning DVT prophylaxis: Eliquis Vaccination status: Has not been vaccinated but did have severe COVID infection initially diagnosed on 08/23/2020.  Patient will be eligible for COVID-vaccine after 11/23/2020   Foley catheter: No, purewick female urinary collection device   HPI: 70 year old female patient with prior history of diabetes, stage III chronic kidney disease, atrial fibrillation, hypertension, dyslipidemia and colon cancer that is post colectomy and colostomy.  Initially diagnosed with COVID on 08/23/20 she was admitted to the hospital due to dehydration, acute kidney injury and hypoxemic respiratory failure.  She was discharged home on 2 to 3 L of oxygen after being treated with IV steroids and remdesivir.  During that time although she had elevated inflammatory markers she had no embolic or thrombotic disease.  She had been on prophylactic dose heparin during the hospitalization and was placed on Eliquis for 2 weeks for after discharge  Patient returned to the ER on 08/31/2020 for complaints of not feeling well.  She was found to be in atrial fibrillation with RVR, hemorrhagic shock and work-up revealed a perforated duodenal ulcer.  She underwent exploratory laparotomy on 11/3.  She has had an extensive ICU stay with significant events as below.   11/2 Admitted with rapid Afib 11/3 OR with findings of perforated duodenal ulcer 11/10 progressive hemorrhagic shock, intubated, transfused, pressors, proned; started on CRRT in PM 11/16 Extubated. Re-intubated overnight due to respiratory distress  and hypoxia with decreased mentation 11/18 Bronch, cultures sent 11/19 Hgb down getting blood 11/24 Spiked fever, resume empirical antimicrobial therapy 11/26 Hemorrhagic shock, hgb 5.6, increased pressors,  11/30 Per palliative "Candace [name] understanding that patient is unfortunately very fragiledespite ongoing intensive medical treatment and full mechanical support.  [name] that the familywantsto continue with all current interventions despite potential outcomes". 12/08 CRRT discontinued due to clotting 12/09 Family requested transfer to tertiary care [facility]). [facility] denied transfer  12/10 CRRT restarted. Episodes of tachycardia, tachypnea that seem to improve with pain management 12/11 Back in shock. Pressor requirements up. CXR worse. ABX resumed 12/12 Still requiring inc pressors. Had hypoglycemic event. Basal insulin dosing adjusted 12/13 Pressor requirements better. Now hyperglycemic. Re-adjusted Glycemic control  12/19 Afebrile . Remains on dilaudid and heparin gtt, dilaudid gtt increased overnight for concern of pain / ongoing tachycardia, no other events . NE and precedex off 12/17. Ongoing CRRT- even UF, dosing lokelmia this morning 12/20 On CRRT. Renal plans for HD cath and moving to HD. Getting wound care 12/24 - renal stopping CRRT today and plans HD 10/24/20 . 40% fio2 on vent via Trach. TAchypenic and tachycardic. Afebrile but wbc up to 27.6K. On TF. On heparin gtt 12/25 - Back on CRRT. On vent via trach at 40% fio2, On scheduled dilaudid as add on to oxy. Per family request 12/24 - they felt scheduled oxy was not adequate and patient was showing signs of opioid withdrawal.  Patient also had worsening SIRS/sepsis syndrome. Had fever, rising wbc, worsening RR 40-60 and HR 140s sinus->started On abx yesterday. Fever some better today. WBC plateau at 28,.5K. On new levophed ->signifanct improvement in HR 77 and RR t0 20. On heparin gtt. On precedex gtt. On levophed gtt 71mg wthi midodrine. On TF 12/30 Remains on CRRT with intermittent pressor requirements. Ethics consult placed 12/29. Ethics rec time trial of CRRT 12/31 failed SBT with RR 40s. Several conversations between care tam and family, who are upset RE plan of care 1/1 back on pressors  1/4: On pressors, keeping even on CVVHD, HGB drop to 6.9, transfused 1 unit. Improving mental  status 1/10 remains on low dose levophed 230m 1/11 off levo, attempting HD with UF for first time. Now tolerating intermittent HD. 1/19 patient transferred to progressive bed with tracheostomy on 35% FiO2.  has NG tube feeding.  No PEG tube.  Received dialysis on 1/18. 1/24 significant improvement and persistent tachycardia after introduction of twice daily beta-blocker.  Heart rates have improved from the 130s to the 98-104 range 1/24 core track clogged and therefore has been removed by nutrition team.  Calorie count in progress and we are weaning any sedating medications which could be contributing to patient's inability to eat. 1/27 continue w/ cuff #6 trach   Subjective: Awake and alert.  Phonating more loudly.  Reports having right lower extremity discomfort that improves with massage.    Objective: Vitals:   12/03/20 0743 12/03/20 0749  BP: (!) 124/107   Pulse:  (!) 105  Resp: (!) 27 (!) 23  Temp: 98.7 F (37.1 C)   SpO2: 98% 100%    Intake/Output Summary (Last 24 hours) at 12/03/2020 [id number] data filed at 12/03/2020 0600 Gross per 24 hour  Intake 1793.61 ml  Output 1190 ml  Net 603.61 ml   Filed Weights    Exam: Constitutional:, Calm, no acute distress Respiratory: Anterior lung sounds clear and no increased work of breathing.  FiO2 21%. #6 cuffed trach deflated with PMV in  place.  Insignificant tracheal secretions. Cardiovascular: Normal heart sounds, extremities warm to touch with adequate capillary refill, pulse regular and nontachycardic at rest Abdomen: On regular diet with nectar thick liquids- LBM 2/2-core track tube in place for feedings which are currently on hold.  Colostomy unremarkable with stool and gas. Skin:  Massive sacral decubitus ulcer-wound VAC now in place Neurologic: CN 2-12 grossly intact. Sensation intact, DTR normal. Strength 1/5 x all 4 extremities.  Psychiatric: Awake, somewhat flat affect, oriented x3   Assessment/Plan: Acute  problems: PAF maintaining sinus rhythm w/persistent tachycardia -Continue metoprolol 80 mg twice daily.  Will discontinue as needed IV metoprolol for now -Cardiology has seen  -Echocardiogram this admission with an EF 50 to 55% with mild diastolic dysfunction parameters and normal RV systolic function -Continue Eliquis-benefits coordinator has determined that Eliquis is covered at [facility] in [place] with a co-pay of $47  Acute respiratory failure secondary to COVID-pneumonia/tracheostomy/acute MSSA tracheobronchitis -Patient stable on low-flow oxygen FiO2 21% -PCCM/trach team following-last evaluated on 1/31 -Repeat respiratory culture on 1/24 with abundant staph with sensitivities pending -Continue IV Ancef for MSSA tracheobronchitis -Continue  suctioning and chest PT-Vibra vest -Continue PMV training   Acute kidney injury secondary to COVID-related sepsis with shock superimposed on stage III chronic kidney disease -Continue intermittent hemodialysis as directed by the renal team -Avoid nephrotoxic medications -Continue Aranesp on dialysis days -Patient is voiding and in the past 24 hours had 300 cc of urine out -BP improved therefore will change midodrine to use on dialysis days only 3 times daily  Lower extremity muscle pain -Begin Crea topically -Continue PROM and AROM  Dysphagia/moderate to severe protein calorie malnutrition Nutrition Status: Nutrition Problem: Increased nutrient needs Etiology: wound healing Signs/Symptoms: estimated needs Interventions: Refer to RD note for recommendations  Estimated body mass index is 31.04 kg/m as calculated from the following:   Height as of this encounter: _0  (1.727 m).   Weight as of this encounter: 92.6 kg.  -2/3: Continue regular diet to carb modified nectar thick liquids given underlying diabetes diagnosis -Tube feedings on hold-patient tolerating regular diet but intake has been quite variable.  Some of this has been influenced by  requirement for nectar thick liquids. -Continue Megace  Acute hemorrhagic shock secondary to perforated duodenal ulcer/anemia of critical illness -Stable postoperatively -Has tolerated tube feedings and oral diet without any evidence of postoperative obstructive issues which can be common after duodenal oversewing with subsequent scar tissue formation -Hemoglobin remains stable greater than 8.0  Diabetes mellitus 2 -Patient has been transition to regular diet along with tube feedings and we have noted significant increase in CBG readings -We will trial holding nocturnal tube feedings and allow oral diet only -Continue very sensitive SSI with meals -HgbA1c 08/24/2020 and was 7.0 -Prior to admission patient was on metformin XR-unable to use at this juncture due to severity of kidney disease -we are hopeful for recovery of renal function  Profound physical deconditioning/acute encephalopathy secondary to prolonged hospital stay/orthostasis -SNF recommended but family wants to take patient home -At this juncture patient unable to sit up in bed independently much less stand pivot and rotate to get into wheelchair and would be significant fall risk and care risk at home -In addition patient would need to be able to get in wheelchair to transport to dialysis if dialysis were to be continued in the outpatient setting -family refuses LTAC or SNF rehab due to pt prior request to NEVER be placed in any type of facility -Continue  high-dose midodrine 3 times daily -Persistent tachycardia secondary to severe deconditioning has improved with the introduction of beta-blockers -Continue PROM every 4 hours-patient encouraged to perform independent AROM as well -2/3 order placed for repositioning every 2 hours -2/2: Successful out of bed to chair with initial short-term plan for 45 minutes to 1 hour today -FAMILY THAT WILL PROVIDE CARE FOR PT WILL NEED TO ASSIST WITH CARE OF THIS PT AT [place]  Acute encephalopathy with ICU delirium -Delirium has resolved and supportive meds of Klonopin and Seroquel have been weaned and discontinued  Stage IV sacral decubitus -Not present on admission -Wound care nurse following and utilizing Santyl and hydrotherapy and now has wound VAC in place -Surgical team consulted.  Current recommendation is against pursuing general anesthesia to undergo extensive surgical procedure-hopefully can be reevaluated in the future if wound does not heal adequately.  As of 1/28 they have nothing further acutely to add but will be following peripherally. [facility] rotational bed  -Discontinued Duragesic patch.  Continue low-dose oxycodone for pain  -Currently bedside to answer medication questions that daughter had.  Patient's son also on speaker phone.  Great length regarding pharmacology of narcotic pain medications at this juncture daughter is agreeable to continuing OxyIR including request to have this medication given at least 1 to 1-1/2 hours prior to wound care.  She is also agreeable to utilizing as needed IV morphine with wound care only if oral premed with OxyIR not adequate.  I have updated patient's bedside LPN and I will also update physical therapy team.  -    11/24/2020 after [facility]      11/22/2020                       11/29/2020  Incision (Closed) 09/01/20 Abdomen (Active)  Date First Assessed/Time First Assessed: 09/01/20 1737   Location: Abdomen    Assessments 09/01/2020  6:25 PM 12/03/2020  4:00 AM  Dressing Type Gauze (Comment) Abdominal pads;Gauze (Comment)  Dressing Change Frequency -- Daily  Site / Wound Assessment Dressing in place / Unable to assess Clean;Dry  Drainage Amount None --     No Linked orders to display     Pressure Injury 09/17/20 Ear Left;Anterior;Posterior Stage 2 -  Partial thickness loss of dermis presenting as a shallow open injury with a red, pink wound bed without slough. (Active)  Date First Assessed/Time First Assessed:  09/17/20 0800   Location: Ear  Location Orientation: Left;Anterior;Posterior  Staging: Stage 2 -  Partial thickness loss of dermis presenting as a shallow open injury with a red, pink wound bed without slough. ...    Assessments 09/17/2020  8:00 AM 12/03/2020  4:00 AM  Dressing Type Foam - Lift dressing to assess site every shift None  Dressing Clean;Dry;Intact --  Dressing Change Frequency Every 3 days --  Site / Wound Assessment Dry;Pink --  Peri-wound Assessment Intact --  Wound Length (cm) 2 cm --  Wound Width (cm) 1 cm --  Wound Depth (cm) 0.25 cm --  Wound Surface Area (cm^2) 2 cm^2 --  Wound Volume (cm^3) 0.5 cm^3 --  Margins Unattached edges (unapproximated) --  Drainage Amount Scant --  Drainage Description Serosanguineous --  Treatment Cleansed --     No Linked orders to display     Pressure Injury 09/25/20 Sacrum Bilateral;Medial Deep Tissue Pressure Injury - Purple or maroon localized area of discolored intact skin or blood-filled blister due to damage of underlying  soft tissue from pressure and/or shear. Purple, non-blanchable, b (Active)  Date First Assessed/Time First Assessed: 09/25/20 2000   Location: Sacrum  Location Orientation: Bilateral;Medial  Staging: Deep Tissue Pressure Injury - Purple or maroon localized area of discolored intact skin or blood-filled blister due to damage o...    Assessments 09/25/2020  5:00 PM 12/03/2020  4:00 AM  Dressing Type Foam - Lift dressing to assess site every shift Negative pressure wound therapy  Dressing Changed;Clean;Dry;Intact Clean;Dry;Intact     No Linked orders to display     Negative Pressure Wound Therapy Sacrum (Active)  Placement Date/Time: 11/22/20 1500   Wound Type: (c) Other (Comment)  Location: Sacrum    Assessments 11/22/2020  4:59 PM 12/03/2020  4:00 AM  Last dressing change 11/22/20 --  Site / Wound Assessment Granulation tissue;Pale;Yellow Clean;Dry  Peri-wound Assessment Intact Intact  Size see above --  Wound  filler - Black foam 2 --  Wound filler - White foam 0 --  Wound filler - Nonadherent 0 --  Wound filler - Gauze 0 --  Cycle Continuous Continuous  Target Pressure (mmHg) 125 125  Instillation Volume 26 mL --  Instillation Solution Normal Saline Normal Saline  Instillation Soak Time 10 minutes 10 minutes  Instillation Therapy Time 3.5 hours 3.5 hours  Canister Changed No --  Dressing Status Intact --  Drainage Amount None --  Output (mL) -- 75 mL     No Linked orders to display     Other problems: History of stage IV colon cancer -Old colostomy  Acute encephalopathy with ICU delirium -Delirium has resolved and supportive meds of Klonopin and Seroquel have been weaned and discontinued  Hypomagnesemia -Continue oral magnesium -1/21 give 4 g magnesium IV x1 -Follow labs  DVT bilateral posterior tibial veins -Was not present during initial COVID admission -Continue Eliquis for now given severe debility and increased risk for developing recurrent DVT  Hypertension -Having issues with orthostasis requiring midodrine Prior to admission patient was on Norvasc  Dyslipidemia -Prior to admission patient was on Crestor-consider resuming soon once patient can swallow pills whole  Abnormal TSH -Nov 2021 TSH was 0.015-TSH and free T4, T3 normal this admission  Data Reviewed: Basic Metabolic Panel: Recent Labs  Lab 11/26/20 0916 11/27/20 0115 11/28/20 0044 11/30/20 1248 12/01/20 0844 12/02/20 0226 12/03/20 0312  NA 134* 131* 132* 131* 132* 132* 137  K 4.3 4.6 3.9 3.4* 3.5 3.7 3.5  CL 95* 94* 95* 96* 95* 95* 100  CO2 25 20* _0 GLUCOSE 105* 229* 224* 294* 354* 339* 194*  BUN 61* 103* 47* 108* 55* 66* 75*  CREATININE 2.59* 2.78* 1.56* 1.70* 1.26* 1.44* 1.35*  CALCIUM 10.4* 9.5 9.4 10.0 9.4 10.1 10.2  PHOS 6.6* 7.2*  --  4.5  --  4.3 4.5   Liver Function Tests: Recent Labs  Lab 11/26/20 0916 11/27/20 0115 11/30/20 1248 12/02/20 0226 12/03/20 0312   ALBUMIN 2.6* 2.6* 2.3* 2.2* 2.1*   No results for input(s): LIPASE, AMYLASE in the last 168 hours. No results for input(s): AMMONIA in the last 168 hours. CBC: Recent Labs  Lab 11/26/20 0916 11/30/20 1248  WBC 15.2* 16.4*  HGB 8.9* 8.3*  HCT 29.5* 27.2*  MCV 86.8 85.5  PLT 410* 369   Cardiac Enzymes: No results for input(s): CKTOTAL, CKMB, CKMBINDEX, TROPONINI in the last 168 hours. BNP (last 3 results) Recent Labs    09/07/20 0118 09/08/20 0446 09/09/20 0428  BNP 216.8* 432.5* 609.7*  ProBNP (last 3 results) No results for input(s): PROBNP in the last 8760 hours.  CBG: Recent Labs  Lab 12/02/20 1549 12/02/20 2034 12/03/20 0012 12/03/20 0345 12/03/20 0741  GLUCAP 288* 212* 238* 174* 145*    No results found for this or any previous visit (from the past 240 hour(s)).   Studies: No results found.  Scheduled Meds: . apixaban  2.5 mg Oral BID  . chlorhexidine gluconate (MEDLINE KIT)  15 mL Mouth Rinse BID  . Chlorhexidine Gluconate Cloth  6 each Topical Daily  . cholecalciferol  2,000 Units Oral Daily  . clonazepam  0.25 mg Oral QHS  . collagenase   Topical BID  . darbepoetin (ARANESP) injection - DIALYSIS  100 mcg Intravenous Q Sat-HD  . feeding supplement (NEPRO CARB STEADY)  237 mL Oral TID BM  . feeding supplement (PROSource TF)  45 mL Per Tube TID  . guaiFENesin  15 mL Oral Q12H  . insulin aspart  0-6 Units Subcutaneous TID WC  . mouth rinse  15 mL Mouth Rinse QID  . megestrol  200 mg Oral QID  . metoprolol tartrate  50 mg Oral BID  . midodrine  40 mg Oral TID WC  . multivitamin  1 tablet Oral QHS  . pantoprazole sodium  40 mg Oral BID  . sodium chloride flush  10-40 mL Intracatheter Q12H   Continuous Infusions: . sodium chloride 50 mL/hr at 12/02/20 1145  . albumin human 25 g (11/16/20 1516)  .  ceFAZolin (ANCEF) IV 1 g (12/02/20 2022)  . ferric gluconate (FERRLECIT/NULECIT) IV 125 mg (12/02/20 1153)    Principal Problem:   Atrial  fibrillation with RVR ([place]) Active Problems:   Acute respiratory failure due to COVID-19 [facility])   New onset a-fib (HCC)   Leukocytosis   Atrial fibrillation with rapid ventricular response (HCC)   Hypoxia   Pressure injury of skin   Acute hypoxemic respiratory failure (HCC)   ARDS (adult respiratory distress syndrome) (HCC)   Perforated duodenal ulcer ([place])   On mechanically assisted ventilation ([place])   Palliative care by specialist   Goals of care, counseling/discussion   Shock ([place])   Acute and chronic respiratory failure (acute-on-chronic) ([place])   Status post tracheostomy ([place])   ESRD (end stage renal disease) ([place])   HAP (hospital-acquired pneumonia)   Consultants:  Cardiology  Surgery  Nephrology  Ethics  PCCM   Procedures: R PICC 11/5 >> A line 11/9 >> out ETT 11/9 > 11/16, 11/16 >> 09/21/2020, 09/21/2020 tracheostomy>> Lt [place] CVL 11/9 >> R IJ trialysis >> out HD catheter 12/1 >>12/20 12/21 - 14.5 Fr, 23 cm right IJ tunneled hemodialysis catheter placement. Removal of indwelling subclavian catheter.   Antibiotics: Anti-infectives (From admission, onward)   Start     Dose/Rate Route Frequency Ordered Stop   11/27/20 2000  ceFAZolin (ANCEF) IVPB 1 g/50 mL premix        1 g 100 mL/hr over 30 Minutes Intravenous Every 24 hours 11/26/20 1248 12/04/20 1959   11/26/20 1345  ceFAZolin (ANCEF) IVPB 1 g/50 mL premix        1 g 100 mL/hr over 30 Minutes Intravenous  Once 11/26/20 1248 11/26/20 1433   11/17/20 1415  fluconazole (DIFLUCAN) 40 MG/ML suspension 200 mg        200 mg Oral  Once 11/17/20 1315 11/17/20 1357   11/03/20 2200  meropenem (MERREM) 500 mg in sodium chloride 0.9 % 100 mL IVPB  500 mg 200 mL/hr over 30 Minutes Intravenous Every 24 hours 11/03/20 1500 11/06/20 2217   11/01/20 1000  anidulafungin (ERAXIS) 100 mg in sodium chloride 0.9 % 100 mL IVPB  Status:  Discontinued       "Followed by" Linked Group Details   100 mg 78 mL/hr over 100  Minutes Intravenous Every 24 hours 10/31/20 0916 11/01/20 1404   10/31/20 1015  meropenem (MERREM) 1 g in sodium chloride 0.9 % 100 mL IVPB  Status:  Discontinued        1 g 200 mL/hr over 30 Minutes Intravenous Every 8 hours 10/31/20 0916 11/03/20 1500   10/31/20 1015  linezolid (ZYVOX) IVPB 600 mg  Status:  Discontinued        600 mg 300 mL/hr over 60 Minutes Intravenous Every 12 hours 10/31/20 0916 11/02/20 0906   10/31/20 1015  anidulafungin (ERAXIS) 200 mg in sodium chloride 0.9 % 200 mL IVPB       "Followed by" Linked Group Details   200 mg 78 mL/hr over 200 Minutes Intravenous  Once 10/31/20 0916 10/31/20 1630   10/25/20 1800  ceFAZolin (ANCEF) IVPB 2g/100 mL premix  Status:  Discontinued        2 g 200 mL/hr over 30 Minutes Intravenous Every 12 hours 10/25/20 1022 10/30/20 1103   10/23/20 2000  vancomycin (VANCOREADY) IVPB 750 mg/150 mL  Status:  Discontinued        750 mg 150 mL/hr over 60 Minutes Intravenous Every 24 hours 10/22/20 2036 10/25/20 1022   10/23/20 1800  piperacillin-tazobactam (ZOSYN) IVPB 3.375 g  Status:  Discontinued        3.375 g 100 mL/hr over 30 Minutes Intravenous Every 6 hours 10/23/20 1155 10/24/20 1422   10/23/20 0200  piperacillin-tazobactam (ZOSYN) IVPB 3.375 g  Status:  Discontinued        3.375 g 100 mL/hr over 30 Minutes Intravenous Every 8 hours 10/22/20 2036 10/23/20 1155   10/22/20 1630  vancomycin (VANCOREADY) IVPB 1500 mg/300 mL        1,500 mg 150 mL/hr over 120 Minutes Intravenous  Once 10/22/20 1537 10/22/20 2229   10/22/20 1630  piperacillin-tazobactam (ZOSYN) IVPB 2.25 g  Status:  Discontinued        2.25 g 100 mL/hr over 30 Minutes Intravenous Every 8 hours 10/22/20 1537 10/22/20 2036   10/22/20 1537  vancomycin variable dose per unstable renal function (pharmacist dosing)  Status:  Discontinued         Does not apply See admin instructions 10/22/20 1537 10/22/20 2036   10/19/20 0600  ceFAZolin (ANCEF) IVPB 2g/100 mL premix        2  g 200 mL/hr over 30 Minutes Intravenous To Radiology 10/18/20 1457 10/19/20 0938   10/10/20 0830  vancomycin (VANCOCIN) IVPB 1000 mg/200 mL premix       "Followed by" Linked Group Details   1,000 mg 200 mL/hr over 60 Minutes Intravenous Every 24 hours 10/09/20 0744 10/15/20 0902   10/09/20 0830  piperacillin-tazobactam (ZOSYN) IVPB 3.375 g        3.375 g 100 mL/hr over 30 Minutes Intravenous Every 6 hours 10/09/20 0744 10/16/20 0038   10/09/20 0830  vancomycin (VANCOREADY) IVPB 2000 mg/400 mL       "Followed by" Linked Group Details   2,000 mg 200 mL/hr over 120 Minutes Intravenous  Once 10/09/20 0744 10/09/20 1022   09/24/20 1000  ceFAZolin (ANCEF) IVPB 2g/100 mL premix  Status:  Discontinued  2 g 200 mL/hr over 30 Minutes Intravenous Every 12 hours 09/24/20 0801 09/24/20 1046   09/23/20 1400  vancomycin (VANCOCIN) IVPB 1000 mg/200 mL premix        1,000 mg 200 mL/hr over 60 Minutes Intravenous Every 24 hours 09/22/20 1436 09/28/20 1718   09/22/20 2200  ceFEPIme (MAXIPIME) 2 g in sodium chloride 0.9 % 100 mL IVPB  Status:  Discontinued        2 g 200 mL/hr over 30 Minutes Intravenous Every 12 hours 09/22/20 1436 09/24/20 0801   09/22/20 1030  ceFEPIme (MAXIPIME) 1 g in sodium chloride 0.9 % 100 mL IVPB        1 g 200 mL/hr over 30 Minutes Intravenous  Once 09/22/20 0934 09/22/20 1145   09/22/20 1015  vancomycin (VANCOCIN) IVPB 1000 mg/200 mL premix        1,000 mg 200 mL/hr over 60 Minutes Intravenous  Once 09/22/20 0934 09/22/20 1446   09/12/20 2200  ceFEPIme (MAXIPIME) 2 g in sodium chloride 0.9 % 100 mL IVPB        2 g 200 mL/hr over 30 Minutes Intravenous Every 12 hours 09/12/20 0732 09/14/20 2134   09/11/20 1400  ceFEPIme (MAXIPIME) 2 g in sodium chloride 0.9 % 100 mL IVPB  Status:  Discontinued        2 g 200 mL/hr over 30 Minutes Intravenous Every 8 hours 09/11/20 1126 09/12/20 0732   09/08/20 1000  vancomycin (VANCOREADY) IVPB 2000 mg/400 mL        2,000 mg 200  mL/hr over 120 Minutes Intravenous  Once 09/08/20 0857 09/08/20 1224   09/08/20 1000  ceFEPIme (MAXIPIME) 2 g in sodium chloride 0.9 % 100 mL IVPB  Status:  Discontinued        2 g 200 mL/hr over 30 Minutes Intravenous Every 12 hours 09/08/20 0857 09/11/20 1126   09/08/20 0856  vancomycin variable dose per unstable renal function (pharmacist dosing)  Status:  Discontinued         Does not apply See admin instructions 09/08/20 0857 09/09/20 0935   09/02/20 1600  cefTRIAXone (ROCEPHIN) 1 g in sodium chloride 0.9 % 100 mL IVPB  Status:  Discontinued        1 g 200 mL/hr over 30 Minutes Intravenous Every 24 hours 09/01/20 1811 09/02/20 0838   09/01/20 1800  fluconazole (DIFLUCAN) IVPB 400 mg        400 mg 50 mL/hr over 240 Minutes Intravenous  Once 09/01/20 1749 09/02/20 0603   09/01/20 1530  piperacillin-tazobactam (ZOSYN) IVPB 3.375 g        3.375 g 12.5 mL/hr over 240 Minutes Intravenous Every 8 hours 09/01/20 1514 09/05/20 2111   09/01/20 1000  levofloxacin (LEVAQUIN) tablet 250 mg  Status:  Discontinued        250 mg Oral Daily 08/31/20 1508 08/31/20 1735   08/31/20 1730  cefTRIAXone (ROCEPHIN) 1 g in sodium chloride 0.9 % 100 mL IVPB  Status:  Discontinued        1 g 200 mL/hr over 30 Minutes Intravenous Every 24 hours 08/31/20 1726 09/01/20 1513   08/31/20 1730  azithromycin (ZITHROMAX) 500 mg in sodium chloride 0.9 % 250 mL IVPB  Status:  Discontinued        500 mg 250 mL/hr over 60 Minutes Intravenous Every 24 hours 08/31/20 1726 09/02/20 0838       Time spent: 30 minutes    [name] ANP  [facility] 7 am -  330 pm/M-F for direct patient care and secure chat Please refer to Amion for contact info 93  days

## 2020-12-03 NOTE — Plan of Care (Signed)
  Problem: Education: Goal: Utilization of techniques to improve thought processes will improve Outcome: Progressing Goal: Knowledge of the prescribed therapeutic regimen will improve Outcome: Progressing   Problem: Activity: Goal: Interest or engagement in leisure activities will improve Outcome: Progressing Goal: Imbalance in normal sleep/wake cycle will improve Outcome: Progressing   Problem: Coping: Goal: Coping ability will improve Outcome: Progressing Goal: Will verbalize feelings Outcome: Progressing   Problem: Health Behavior/Discharge Planning: Goal: Ability to make decisions will improve Outcome: Progressing Goal: Compliance with therapeutic regimen will improve Outcome: Progressing   Problem: Role Relationship: Goal: Will demonstrate positive changes in social behaviors and relationships Outcome: Progressing   Problem: Safety: Goal: Ability to identify and utilize support systems that promote safety will improve Outcome: Progressing   Problem: Self-Concept: Goal: Level of anxiety will decrease Outcome: Progressing   Problem: Education: Goal: Knowledge of General Education information will improve Description: Including pain rating scale, medication(s)/side effects and non-pharmacologic comfort measures Outcome: Progressing   Problem: Health Behavior/Discharge Planning: Goal: Ability to manage health-related needs will improve Outcome: Progressing   Problem: Clinical Measurements: Goal: Ability to maintain clinical measurements within normal limits will improve Outcome: Progressing Goal: Will remain free from infection Outcome: Progressing Goal: Diagnostic test results will improve Outcome: Progressing Goal: Respiratory complications will improve Outcome: Progressing Goal: Cardiovascular complication will be avoided Outcome: Progressing   Problem: Activity: Goal: Risk for activity intolerance will decrease Outcome: Progressing   Problem:  Nutrition: Goal: Adequate nutrition will be maintained Outcome: Progressing   Problem: Coping: Goal: Level of anxiety will decrease Outcome: Progressing   Problem: Elimination: Goal: Will not experience complications related to bowel motility Outcome: Progressing Goal: Will not experience complications related to urinary retention Outcome: Progressing   Problem: Pain Managment: Goal: General experience of comfort will improve Outcome: Progressing   Problem: Safety: Goal: Ability to remain free from injury will improve Outcome: Progressing   Problem: Skin Integrity: Goal: Risk for impaired skin integrity will decrease Outcome: Progressing   Problem: Activity: Goal: Ability to tolerate increased activity will improve Outcome: Progressing   Problem: Respiratory: Goal: Ability to maintain a clear airway and adequate ventilation will improve Outcome: Progressing   Problem: Role Relationship: Goal: Method of communication will improve Outcome: Progressing   Problem: Education: Goal: Knowledge of disease and its progression will improve Outcome: Progressing   Problem: Fluid Volume: Goal: Compliance with measures to maintain balanced fluid volume will improve Outcome: Progressing   Problem: Health Behavior/Discharge Planning: Goal: Ability to manage health-related needs will improve Outcome: Progressing   Problem: Nutritional: Goal: Ability to make healthy dietary choices will improve Outcome: Progressing   Problem: Clinical Measurements: Goal: Complications related to the disease process, condition or treatment will be avoided or minimized Outcome: Progressing   Problem: Education: Goal: Knowledge of risk factors and measures for prevention of condition will improve Outcome: Progressing   Problem: Coping: Goal: Psychosocial and spiritual needs will be supported Outcome: Progressing   Problem: Respiratory: Goal: Will maintain a patent airway Outcome:  Progressing Goal: Complications related to the disease process, condition or treatment will be avoided or minimized Outcome: Progressing

## 2020-12-03 NOTE — Progress Notes (Incomplete)
PROGRESS NOTE    Candace [name]  TGY:[id number] DOB: 11-Dec-1950 DOA: 08/31/2020 PCP: Algis Greenhouse, MD  Outpatient Specialists:     Brief Narrative:  ***   Assessment & Plan:   Principal Problem:   Atrial fibrillation with RVR ([place]) Active Problems:   Acute respiratory failure due to COVID-19 [facility])   New onset a-fib (HCC)   Leukocytosis   Atrial fibrillation with rapid ventricular response (HCC)   Hypoxia   Pressure injury of skin   Acute hypoxemic respiratory failure (HCC)   ARDS (adult respiratory distress syndrome) (HCC)   Perforated duodenal ulcer ([place])   On mechanically assisted ventilation (HCC)   Palliative care by specialist   Goals of care, counseling/discussion   Shock ([place])   Acute and chronic respiratory failure (acute-on-chronic) ([place])   Status post tracheostomy ([place])   ESRD (end stage renal disease) ([place])   HAP (hospital-acquired pneumonia)   ***   DVT prophylaxis: *** Code Status: *** Family Communication: *** Disposition Plan: ***   Consultants:   ***  Procedures:   ***  Antimicrobials:   ***    Subjective: ***  Objective: Vitals:   12/03/20 0834 12/03/20 1046 12/03/20 1123 12/03/20 1315  BP: (!) 132/115 108/86  (!) 114/57  Pulse:   98 (!) 101  Resp:   (!) 28 20  Temp:    98.5 F (36.9 C)  TempSrc:      SpO2:   100% 100%  Height:        Intake/Output Summary (Last 24 hours) at 12/03/2020 1350 Last data filed at 12/03/2020 0600 Gross per 24 hour  Intake 1793.61 ml  Output 890 ml  Net 903.61 ml   Filed Weights    Examination:  General exam: Appears calm and comfortable  Respiratory system: Clear to auscultation. Respiratory effort normal. Cardiovascular system: S1 & S2 heard, RRR. No JVD, murmurs, rubs, gallops or clicks. No pedal edema. Gastrointestinal system: Abdomen is nondistended, soft and nontender. No organomegaly or masses felt. Normal bowel sounds heard. Central nervous system: Alert and oriented. No focal  neurological deficits. Extremities: Symmetric 5 x 5 power. Skin: No rashes, lesions or ulcers Psychiatry: Judgement and insight appear normal. Mood & affect appropriate.     Data Reviewed: I have personally reviewed following labs and imaging studies  CBC: Recent Labs  Lab 11/30/20 1248  WBC 16.4*  HGB 8.3*  HCT 27.2*  MCV 85.5  PLT 287   Basic Metabolic Panel: Recent Labs  Lab 11/27/20 0115 11/28/20 0044 11/30/20 1248 12/01/20 0844 12/02/20 0226 12/03/20 0312  NA 131* 132* 131* 132* 132* 137  K 4.6 3.9 3.4* 3.5 3.7 3.5  CL 94* 95* 96* 95* 95* 100  CO2 20* 24 23 24 23 24   GLUCOSE 229* 224* 294* 354* 339* 194*  BUN 103* 47* 108* 55* 66* 75*  CREATININE 2.78* 1.56* 1.70* 1.26* 1.44* 1.35*  CALCIUM 9.5 9.4 10.0 9.4 10.1 10.2  PHOS 7.2*  --  4.5  --  4.3 4.5   GFR: Estimated Creatinine Clearance: 46.8 mL/min (A) (by C-G formula based on SCr of 1.35 mg/dL (H)). Liver Function Tests: Recent Labs  Lab 11/27/20 0115 11/30/20 1248 12/02/20 0226 12/03/20 0312  ALBUMIN 2.6* 2.3* 2.2* 2.1*   No results for input(s): LIPASE, AMYLASE in the last 168 hours. No results for input(s): AMMONIA in the last 168 hours. Coagulation Profile: No results for input(s): INR, PROTIME in the last 168 hours. Cardiac Enzymes: No results for input(s): CKTOTAL, CKMB, CKMBINDEX, TROPONINI  in the last 168 hours. BNP (last 3 results) No results for input(s): PROBNP in the last 8760 hours. HbA1C: Recent Labs    12/01/20 1248  HGBA1C 6.2*   CBG: Recent Labs  Lab 12/02/20 2034 12/03/20 0012 12/03/20 0345 12/03/20 0741 12/03/20 1301  GLUCAP 212* 238* 174* 145* 267*   Lipid Profile: No results for input(s): CHOL, HDL, LDLCALC, TRIG, CHOLHDL, LDLDIRECT in the last 72 hours. Thyroid Function Tests: No results for input(s): TSH, T4TOTAL, FREET4, T3FREE, THYROIDAB in the last 72 hours. Anemia Panel: Recent Labs    12/02/20 0226  FERRITIN 1,014*  TIBC 308  IRON 35   Urine  analysis:    Component Value Date/Time   COLORURINE YELLOW 08/24/2020 1330   APPEARANCEUR CLOUDY (A) 08/24/2020 1330   LABSPEC 1.012 08/24/2020 1330   PHURINE 5.0 08/24/2020 1330   GLUCOSEU NEGATIVE 08/24/2020 1330   HGBUR LARGE (A) 08/24/2020 1330   BILIRUBINUR NEGATIVE 08/24/2020 1330   KETONESUR NEGATIVE 08/24/2020 1330   PROTEINUR 30 (A) 08/24/2020 1330   NITRITE NEGATIVE 08/24/2020 1330   LEUKOCYTESUR LARGE (A) 08/24/2020 1330   Sepsis Labs: @LABRCNTIP (procalcitonin:4,lacticidven:4)  )No results found for this or any previous visit (from the past 240 hour(s)).       Radiology Studies: No results found.      Scheduled Meds: . apixaban  2.5 mg Oral BID  . chlorhexidine gluconate (MEDLINE KIT)  15 mL Mouth Rinse BID  . Chlorhexidine Gluconate Cloth  6 each Topical Daily  . cholecalciferol  2,000 Units Oral Daily  . clonazepam  0.25 mg Oral QHS  . collagenase   Topical BID  . darbepoetin (ARANESP) injection - DIALYSIS  100 mcg Intravenous Q Sat-HD  . feeding supplement (NEPRO CARB STEADY)  237 mL Oral TID BM  . feeding supplement (PROSource TF)  45 mL Per Tube TID  . guaiFENesin  15 mL Oral Q12H  . insulin aspart  0-6 Units Subcutaneous TID WC  . mouth rinse  15 mL Mouth Rinse QID  . megestrol  200 mg Oral QID  . metoprolol tartrate  50 mg Oral BID  . midodrine  40 mg Oral 3 times per day on Mon Wed Fri  . multivitamin  1 tablet Oral QHS  . pantoprazole sodium  40 mg Oral BID  . sodium chloride flush  10-40 mL Intracatheter Q12H   Continuous Infusions: . sodium chloride 50 mL/hr at 12/03/20 1011  . albumin human 25 g (11/16/20 1516)  .  ceFAZolin (ANCEF) IV 1 g (12/02/20 2022)  . ferric gluconate (FERRLECIT/NULECIT) IV 125 mg (12/03/20 0831)     LOS: 93 days    Time spent: ***    Dana Allan, MD  [facility] Pager #: [phone] 7PM-7AM contact night coverage as above

## 2020-12-03 NOTE — Progress Notes (Signed)
Patient ID: Candace [name], female   DOB: 01-Jun-1951, 70 y.o.   MRN: [id number] [facility] Progress Note   Assessment/ Plan:   1. Acute kidney Injury: Suspected to be multifactorial acute kidney injury from septic shock/MRSA pneumonia and hemorrhagic shock at the outside.  Has been on renal replacement therapy since 09/08/2020 and just recently able to transition to intermittent hemodialysis from CRRT.  Intermittent hemodialysis has been limited with regards to ultrafiltration due to her hypotension.  Surprisingly, creatinine improving with rising BUN in the setting of oliguric urine output (350 cc overnight).  She is not a candidate for CIR and family have no desire to have her admitted to SNF or LTAC; this is likely to make outpatient dialysis unit placement a little challenging should she not have complete renal recovery. 2.  History of Covid pneumonia with ARDS: Status post tracheostomy and respiratory status appears to be stable with supplementation via T-bar. 3.  Atrial fibrillation with rapid ventricular response: Currently appears to be rate controlled on metoprolol.  On anticoagulation with Eliquis. 4.  Severe protein calorie malnutrition with large sacral decubitus ulcer: Ongoing nutritional supplementation with wound care. 5.  Anemia: Complicated by GI bleed from perforated duodenal ulcer status post exploratory laparotomy with Phillip Heal patch placement.  Slight downtrend of hemoglobin/hematocrit noted on labs from 3 days ago.  Subjective:   Without acute events overnight, informs me that she has some intermittent pain in her right foot   Objective:   BP 129/77 (BP Location: Left Arm)   Pulse 100   Temp 98.6 F (37 C) (Oral)   Resp 20   Ht 5' 8"  (1.727 m)   Wt 92.6 kg   SpO2 100%   BMI 31.04 kg/m   Intake/Output Summary (Last 24 hours) at 12/03/2020 0740 Last data filed at 12/03/2020 0600 Gross per 24 hour  Intake 1793.61 ml  Output 1190 ml  Net 603.61 ml   Weight  change:   Physical Exam: Gen: Appears comfortable resting in bed, able to answer questions appropriately CVS: Pulse irregularly irregular, S1 and S2 normal with ejection systolic murmur Resp: Coarse/transmitted breath sounds bilaterally, no distinct rhonchi/rales Abd: Soft, obese, nontender Ext: Trace ankle edema, trace dependent edema over back  Imaging: No results found.  Labs: BMET Recent Labs  Lab 11/26/20 0916 11/27/20 0115 11/28/20 0044 11/30/20 1248 12/01/20 0844 12/02/20 0226 12/03/20 0312  NA 134* 131* 132* 131* 132* 132* 137  K 4.3 4.6 3.9 3.4* 3.5 3.7 3.5  CL 95* 94* 95* 96* 95* 95* 100  CO2 25 20* 24 23 24 23 24   GLUCOSE 105* 229* 224* 294* 354* 339* 194*  BUN 61* 103* 47* 108* 55* 66* 75*  CREATININE 2.59* 2.78* 1.56* 1.70* 1.26* 1.44* 1.35*  CALCIUM 10.4* 9.5 9.4 10.0 9.4 10.1 10.2  PHOS 6.6* 7.2*  --  4.5  --  4.3 4.5   CBC Recent Labs  Lab 11/26/20 0916 11/30/20 1248  WBC 15.2* 16.4*  HGB 8.9* 8.3*  HCT 29.5* 27.2*  MCV 86.8 85.5  PLT 410* 369    Medications:    . apixaban  2.5 mg Oral BID  . chlorhexidine gluconate (MEDLINE KIT)  15 mL Mouth Rinse BID  . Chlorhexidine Gluconate Cloth  6 each Topical Daily  . cholecalciferol  2,000 Units Oral Daily  . clonazepam  0.25 mg Oral QHS  . collagenase   Topical BID  . darbepoetin (ARANESP) injection - DIALYSIS  100 mcg Intravenous Q Sat-HD  . feeding supplement (  NEPRO CARB STEADY)  237 mL Oral TID BM  . feeding supplement (PROSource TF)  45 mL Per Tube TID  . guaiFENesin  15 mL Oral Q12H  . insulin aspart  0-6 Units Subcutaneous TID WC  . mouth rinse  15 mL Mouth Rinse QID  . megestrol  200 mg Oral QID  . metoprolol tartrate  50 mg Oral BID  . midodrine  40 mg Oral TID WC  . multivitamin  1 tablet Oral QHS  . pantoprazole sodium  40 mg Oral BID  . sodium chloride flush  10-40 mL Intracatheter Q12H   Elmarie Shiley, MD 12/03/2020, 7:40 AM

## 2020-12-03 NOTE — Progress Notes (Signed)
Calorie Count Note  Calorie count ordered. All information obtained from daughter. Despite calorie count order with instructions and discussion with RN yesterday regarding the calorie count, no calorie count tickets are located in calorie count folder. No recorded po intake in I/O flow sheet yesterday  Diet: Regular, Nectar Thick Supplements: Nepro TID (each supplement provides 425 kcal and 19 grams protein). Magic cup TID  (each cup provides 290 kcal and 9 grams of protein)  Again patient is very happy about the diet upgrade to Regular consistency and is enjoying the food. Daughter believes that patient's po intake is improving.   2/3 Breakfast: not available Lunch: 100% greek yogurt, 50% Magic Cup (230 kcals, 17 g of protein) Dinner: 1/3 of [employer], Nectar Cranberry Juice (350 kcals, 10 g protein) HS snack: 100% cereal with milk (250 kcals, 8 g of protein) Supplements: At least 1 Nepro (100%)- 425 kcals, 19 g of protein  2/4 Breakfast: pt just starting to work on Breakfast this AM, drank 1/3 Nepro thus far (150 kcals, 6 g protein)  Total intake: 1405 kcal (65% of minimum estimated needs)  60 protein (60 % of minimum estimated needs)   Goal: Meet >/=  75% of needs, increased po intake of Nepro to at least BID (currently ordered TID)  Intervention:    Continue Nepro Shake po TID, each supplement provides 425 kcal and 19 grams protein  Continue Magic cup TID with meals, each supplement provides 290 kcal and 9 grams of protein  Cater to pt preferences; daughter ordering meals for patient. Pt enjoys the tilapia and chicken salad. Pt also wanting to alternate between vanilla and strawberry/blueberry greek yogurt  Continue liberalized diet (no restrictions other than the nectar liquids); allow double portion of protein at meals  Continue Renal MVI  Continue appetite stimulant (currently on Megace)   Kerman Passey MS, RDN, LDN, CNSC Registered Dietitian  III Clinical Nutrition RD Pager and On-Call Pager Number Located in [place]

## 2020-12-03 NOTE — Progress Notes (Signed)
Called patient daughter [name] with update concerning patient care per family request.

## 2020-12-04 DIAGNOSIS — I4891 Unspecified atrial fibrillation: Secondary | ICD-10-CM | POA: Diagnosis not present

## 2020-12-04 LAB — RENAL FUNCTION PANEL
Albumin: 2.1 g/dL — ABNORMAL LOW (ref 3.5–5.0)
Anion gap: 11 (ref 5–15)
BUN: 73 mg/dL — ABNORMAL HIGH (ref 8–23)
CO2: 19 mmol/L — ABNORMAL LOW (ref 22–32)
Calcium: 9.5 mg/dL (ref 8.9–10.3)
Chloride: 107 mmol/L (ref 98–111)
Creatinine, Ser: 1.24 mg/dL — ABNORMAL HIGH (ref 0.44–1.00)
GFR, Estimated: 47 mL/min — ABNORMAL LOW (ref >60)
Glucose, Bld: 198 mg/dL — ABNORMAL HIGH (ref 70–99)
Phosphorus: 4.3 mg/dL (ref 2.5–4.6)
Potassium: 3.3 mmol/L — ABNORMAL LOW (ref 3.5–5.1)
Sodium: 137 mmol/L (ref 135–145)

## 2020-12-04 LAB — GLUCOSE, CAPILLARY
Glucose-Capillary: 157 mg/dL — ABNORMAL HIGH (ref 70–99)
Glucose-Capillary: 124 mg/dL — ABNORMAL HIGH (ref 70–99)
Glucose-Capillary: 173 mg/dL — ABNORMAL HIGH (ref 70–99)
Glucose-Capillary: 185 mg/dL — ABNORMAL HIGH (ref 70–99)
Glucose-Capillary: 171 mg/dL — ABNORMAL HIGH (ref 70–99)

## 2020-12-04 LAB — CBC
HCT: 24.2 % — ABNORMAL LOW (ref 36.0–46.0)
Hemoglobin: 7.5 g/dL — ABNORMAL LOW (ref 12.0–15.0)
MCH: 26.3 pg (ref 26.0–34.0)
MCHC: 31 g/dL (ref 30.0–36.0)
MCV: 84.9 fL (ref 80.0–100.0)
Platelets: 417 10*3/uL — ABNORMAL HIGH (ref 150–400)
RBC: 2.85 MIL/uL — ABNORMAL LOW (ref 3.87–5.11)
RDW: 16.6 % — ABNORMAL HIGH (ref 11.5–15.5)
WBC: 16.6 10*3/uL — ABNORMAL HIGH (ref 4.0–10.5)
nRBC: 0 % (ref 0.0–0.2)

## 2020-12-04 MED ORDER — DARBEPOETIN ALFA 100 MCG/0.5ML IJ SOSY
PREFILLED_SYRINGE | INTRAMUSCULAR | Status: AC
  Filled 2020-12-04: qty 0.5

## 2020-12-04 MED ORDER — HEPARIN SODIUM (PORCINE) 1000 UNIT/ML IJ SOLN
INTRAMUSCULAR | Status: AC
  Administered 2020-12-04: 1000 [IU]
  Filled 2020-12-04: qty 4

## 2020-12-04 MED ORDER — OXYCODONE HCL 5 MG PO TABS
ORAL_TABLET | ORAL | Status: AC
  Filled 2020-12-04: qty 1

## 2020-12-04 NOTE — Progress Notes (Signed)
Pt daughter states that patient is complaining of the cold air from the aerosol trach collar.  At this time, patient is resting comfortably with the PMV in place and saturating at 97% on room air.  This Probation officer told the daughter it would be acceptable to leave the trach collar off during the day when the [employer] valve is in place as long as the patient was not requiring supplemental oxygen.  We will request that the patient wears the trach collar at night when she is resting without the PMV in place.  The daughter and the patient both voiced their understanding and agreement.

## 2020-12-04 NOTE — Procedures (Signed)
Patient seen on Hemodialysis. BP 128/76 (BP Location: Left Wrist)   Pulse (!) 107   Temp 98.5 F (36.9 C) (Oral)   Resp 19   Ht 5\' 8"  (1.727 m)   Wt 92.6 kg   SpO2 99%   BMI 31.04 kg/m   QB 400, UF goal 2L Tolerating treatment with some musculoskeletal discomfort that is partly improved by re-positioning.   Elmarie Shiley MD [facility]. Office # [phone] Pager # [phone] 8:21 AM

## 2020-12-04 NOTE — Progress Notes (Signed)
PROGRESS NOTE    Candace [name]  IWL:[id number] DOB: [birth year] DOA: 08/31/2020 PCP: Algis Greenhouse, MD   Brief Narrative: LLOS. 77 days in intensive care unit, most of the events are as per hospital records and previous providers documentation.  Patient does have history of colon cancer status post colon resection with colostomy bag, hyperlipidemia, type 2 diabetes, stage III chronic kidney disease.  She was admitted to the hospital and treated for COVID-19 pneumonia and was discharged back home.  Sent to the ER by her home care nurse due to elevated heart rate. 70 year old female who was recently diagnosed with COVID on 08/23/2020 came back to the ER with not feeling well, she was admitted on 08/31/2020 with A. fib with RVR, hemorrhagic shock and found to have perforated duodenal ulcer for that she underwent exploratory laparotomy on 11/3.  She had extensive ICU stay with significant events as below.  11/2 Admitted with rapid Afib 11/3 OR with findings of perforated duodenal ulcer 11/10 progressive hemorrhagic shock, intubated, transfused, pressors, proned; started on CRRT in PM 11/16 Extubated. Re-intubated overnight due to respiratory distress and hypoxia with decreased mentation 11/18 Bronch, cultures sent 11/19 Hgb down getting blood 11/24 Spiked fever, resume empirical antimicrobial therapy 11/26 Hemorrhagic shock, hgb 5.6, increased pressors,  11/30 Per palliative "[name] understanding that patient is unfortunately very fragiledespite ongoing intensive medical treatment and full mechanical support. [name] that the familywantsto continue with all current interventions despite potential outcomes". 12/08 CRRT discontinued due to clotting 12/09 Family requested transfer to tertiary care [facility] denied transfer  12/10 CRRT restarted. Episodes of tachycardia, tachypnea that seem to improve with pain management 12/11 Back in shock. Pressor requirements up. CXR worse. ABX  resumed 12/12 Still requiring inc pressors. Had hypoglycemic event. Basal insulin dosing adjusted 12/13 Pressor requirements better. Now hyperglycemic. Re-adjusted Glycemic control  12/19 Afebrile . Remains on dilaudid and heparin gtt, dilaudid gtt increased overnight for concern of pain / ongoing tachycardia, no other events . NE and precedex off 12/17. Ongoing CRRT- even UF, dosing lokelmia this morning 12/20 On CRRT. Renal plans for HD cath and moving to HD. Getting wound care 12/24 - renal stopping CRRT today and plans HD 10/24/20 . 40% fio2 on vent via Trach. TAchypenic and tachycardic. Afebrile but wbc up to 27.6K. On TF. On heparin gtt 12/25 - Back on CRRT. On vent via trach at 40% fio2, On scheduled dilaudid as add on to oxy. Per family request 12/24 - they felt scheduled oxy was not adequate and patient was showing signs of opioid withdrawal.  Patient also had worsening SIRS/sepsis syndrome. Had fever, rising wbc, worsening RR 40-60 and HR 140s sinus->started On abx yesterday. Fever some better today. WBC plateau at 28,.5K. On new levophed ->signifanct improvement in HR 77 and RR t0 20. On heparin gtt. On precedex gtt. On levophed gtt 12mg wthi midodrine. On TF 12/30 Remains on CRRT with intermittent pressor requirements. Ethics consult placed 12/29. Ethics rec time trial of CRRT 12/31 failed SBT with RR 40s. Several conversations between care tam and family, who are upset RE plan of care 1/1 back on pressors  1/4: On pressors, keeping even on CVVHD, HGB drop to 6.9, transfused 1 unit. Improving mental status 1/10 remains on low dose levophed 279m 1/11 off levo, attempting HD with UF for first time. Now tolerating intermittent HD. 1/19 patient transferred to progressive bed with tracheostomy on 35% FiO2.  has NG tube feeding.  No PEG tube.  Receiving dialysis.  12/04/2020:  Patient seen.  Also discussed with the patient's daughter extensively.  No new changes.  Patient underwent  hemodialysis earlier today.  Patient is currently stable.  Patient's daughter was a bit worried about the presence of blood in patient's wound VAC.  Patient's daughter was reassured regarding the need to ensure that the wound care done yesterday was such that viable tissue was reached.    Assessment & Plan:   Principal Problem:   Atrial fibrillation with RVR (HCC) Active Problems:   Acute respiratory failure due to COVID-19 (HCC)   New onset a-fib (HCC)   Leukocytosis   Atrial fibrillation with rapid ventricular response (HCC)   Hypoxia   Pressure injury of skin   Acute hypoxemic respiratory failure (HCC)   ARDS (adult respiratory distress syndrome) (HCC)   Perforated duodenal ulcer ([place])   On mechanically assisted ventilation ([place])   Palliative care by specialist   Goals of care, counseling/discussion   Shock ([place])   Acute and chronic respiratory failure (acute-on-chronic) ([place])   Status post tracheostomy ([place])   ESRD (end stage renal disease) ([place])   HAP (hospital-acquired pneumonia)   Paroxysmal A. fib with RVR: Hospital course remarkable for paroxysmal A. fib with RVR but had been lately on normal sinus rhythm.  She was in sinus tachycardia this morning.  Continue metoprolol.  BP soft  and is on midodrine.  Cardiology were following her here.  They recommended symptomatic treatment and beta-blocker titration as tolerated.  We will monitor her on telemetry.  On PRN IV metoprolol if her blood pressure allows.  12/04/2020: Heart rate has ranged from 96 to 138 bpm.  Patient is on Eliquis 2.5 Mg p.o. twice daily and metoprolol tartrate 50 Mg p.o. twice daily.  Acute respiratory secondary to Covid pneumonia: Status post tracheostomy.  Hospital course remarkable for multiple bacterial pneumonias, ventilator dependent respiratory failure.  Completed antibiotic therapy.  Currently on trach collar on 28% FiO2.  Respiratory, PCCM following.  Continue pulmonary toileting, dysphagia therapy,  suctioning.  Patient was able to tolerate Passy-Muir valve. PCCM holding for downsize her tracheostomy because of persistent secretions.. She continues to have thick secretions from her tracheostomy.  Needs frequent suctioning.  Chest x-ray done on 11/27/29  did not show any new pneumonia.  She has been started on cefazolin for positive tracheal culture showing MSSA,last day of Abx will be 2/4 We have to minimize nebulizing treatment because of tachycardia.  Given hypertonic saline to loosen the secretions. 12/04/2018: Stable.  Status post tracheostomy.  Trach tube was downsized from size 6 to size 4 yesterday.  Dysphagia/poor oral intake.  Speech therapy following.  Passed barium swallow evaluation and was started on dysphagia 2 diet. Continues to have poor oral intake.  Her Dobbhoff was clogged and was removed on 1/24.Another feeding tube placed and currently on tube feeding  also.  Acute metabolic encephalopathy: Continue monitor mental status.  Improved.  Able to follow command.  Be judicious on  sedatives.  AKI: Nephrology following.  On intermittent dialysis.  On midodrine for blood pressure support.  Family interested in dialysis at home but not feasible as per nephrology.  Current recommendation is for skilled nursing facility/LTAC level of care.  Social worker, nephrology closely following for this. Status post tunneled catheter exchange. 12/04/2020: Patient underwent hemodialysis today.  Pneumoperitoneum/perforated duodenal ulcer:status post exploratory laparotomy, adhesiolysis, modified Graham patch, repair of perforated ulcer on 11/3 by Dr. Bobbye Morton week.  Tolerating oral diet.  Surgery recommended twice daily wet-to-dry dressing of midline abdominal  wound  Hemorrhagic shock: Resolved.  Blood pressure currently stable.  DVT of lower extremity: On Eliquis for anticoagulation.  Anemia of critical illness: Received multiple transfusions during this hospitalization.  Total of 12 units of PRBC.   Currently hemoglobin stable.  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  Also on Seroquel, Klonopin PT/OT recommended skilled nursing facility placement but is very challenging due to need of dialysis, concurrent tracheostomy, pressure ulcer.  Possible LTAC.  As per the daughter, she is  interested on taking her mom to home with home health.  Type 2 diabetes mellitus with hyperglycemia: Episodes of hypoglycemia after extremity feeds.  Continue sliding scale insulin.  Monitor blood sugars 12/04/2020: Continue to monitor closely.  Sacral  decubitus ulcer: Not present on admission.  Has extensive sacral decubitus ulcer.  Wound care following.  Also seen by general surgery.  Recommended against anesthesia/surgical procedure.  Continue wound care.  Currently receiving hydrotherapy  History of stage IV colon cancer: Has colostomy.  Outpatient follow-up recommended  Nutrition Problem: Increased nutrient needs Etiology: wound healing  DVT prophylaxis:Eliquis Code Status: Full Family Communication: Daughter Status is: Inpatient  Remains inpatient appropriate because:Unsafe d/c plan   Dispo: The patient is from: Home              Anticipated d/c is to: LTAC vs Home with home health              Anticipated d/c date is: > 3 days              Patient currently is not medically stable to d/c.   Difficult to place patient Yes      Consultants: Cardiology, surgery, nephrology, ethics, PCCM  Procedures:R PICC 11/5 >> A line 11/9 >> out ETT 11/9 > 11/16, 11/16 >> 09/21/2020, 09/21/2020 tracheostomy>> Lt [place] CVL 11/9 >> R IJ trialysis >> out HD catheter 12/1 >>12/20 12/21 - 14.5 Fr, 23 cm right IJ tunneled hemodialysis catheter placement. Removal of indwelling subclavian catheter.   Antimicrobials:  Anti-infectives (From admission, onward)   Start     Dose/Rate Route Frequency Ordered Stop   11/27/20  2000  ceFAZolin (ANCEF) IVPB 1 g/50 mL premix        1 g 100 mL/hr over 30 Minutes Intravenous Every 24 hours 11/26/20 1248 12/03/20 2136   11/26/20 1345  ceFAZolin (ANCEF) IVPB 1 g/50 mL premix        1 g 100 mL/hr over 30 Minutes Intravenous  Once 11/26/20 1248 11/26/20 1433   11/17/20 1415  fluconazole (DIFLUCAN) 40 MG/ML suspension 200 mg        200 mg Oral  Once 11/17/20 1315 11/17/20 1357   11/03/20 2200  meropenem (MERREM) 500 mg in sodium chloride 0.9 % 100 mL IVPB        500 mg 200 mL/hr over 30 Minutes Intravenous Every 24 hours 11/03/20 1500 11/06/20 2217   11/01/20 1000  anidulafungin (ERAXIS) 100 mg in sodium chloride 0.9 % 100 mL IVPB  Status:  Discontinued       "Followed by" Linked Group Details   100 mg 78 mL/hr over 100 Minutes Intravenous Every 24 hours 10/31/20 0916 11/01/20 1404   10/31/20 1015  meropenem (MERREM) 1 g in sodium chloride 0.9 % 100 mL IVPB  Status:  Discontinued        1 g 200 mL/hr over 30 Minutes Intravenous Every 8 hours 10/31/20 0916 11/03/20 1500   10/31/20  1015  linezolid (ZYVOX) IVPB 600 mg  Status:  Discontinued        600 mg 300 mL/hr over 60 Minutes Intravenous Every 12 hours 10/31/20 0916 11/02/20 0906   10/31/20 1015  anidulafungin (ERAXIS) 200 mg in sodium chloride 0.9 % 200 mL IVPB       "Followed by" Linked Group Details   200 mg 78 mL/hr over 200 Minutes Intravenous  Once 10/31/20 0916 10/31/20 1630   10/25/20 1800  ceFAZolin (ANCEF) IVPB 2g/100 mL premix  Status:  Discontinued        2 g 200 mL/hr over 30 Minutes Intravenous Every 12 hours 10/25/20 1022 10/30/20 1103   10/23/20 2000  vancomycin (VANCOREADY) IVPB 750 mg/150 mL  Status:  Discontinued        750 mg 150 mL/hr over 60 Minutes Intravenous Every 24 hours 10/22/20 2036 10/25/20 1022   10/23/20 1800  piperacillin-tazobactam (ZOSYN) IVPB 3.375 g  Status:  Discontinued        3.375 g 100 mL/hr over 30 Minutes Intravenous Every 6 hours 10/23/20 1155 10/24/20 1422   10/23/20  0200  piperacillin-tazobactam (ZOSYN) IVPB 3.375 g  Status:  Discontinued        3.375 g 100 mL/hr over 30 Minutes Intravenous Every 8 hours 10/22/20 2036 10/23/20 1155   10/22/20 1630  vancomycin (VANCOREADY) IVPB 1500 mg/300 mL        1,500 mg 150 mL/hr over 120 Minutes Intravenous  Once 10/22/20 1537 10/22/20 2229   10/22/20 1630  piperacillin-tazobactam (ZOSYN) IVPB 2.25 g  Status:  Discontinued        2.25 g 100 mL/hr over 30 Minutes Intravenous Every 8 hours 10/22/20 1537 10/22/20 2036   10/22/20 1537  vancomycin variable dose per unstable renal function (pharmacist dosing)  Status:  Discontinued         Does not apply See admin instructions 10/22/20 1537 10/22/20 2036   10/19/20 0600  ceFAZolin (ANCEF) IVPB 2g/100 mL premix        2 g 200 mL/hr over 30 Minutes Intravenous To Radiology 10/18/20 1457 10/19/20 0938   10/10/20 0830  vancomycin (VANCOCIN) IVPB 1000 mg/200 mL premix       "Followed by" Linked Group Details   1,000 mg 200 mL/hr over 60 Minutes Intravenous Every 24 hours 10/09/20 0744 10/15/20 0902   10/09/20 0830  piperacillin-tazobactam (ZOSYN) IVPB 3.375 g        3.375 g 100 mL/hr over 30 Minutes Intravenous Every 6 hours 10/09/20 0744 10/16/20 0038   10/09/20 0830  vancomycin (VANCOREADY) IVPB 2000 mg/400 mL       "Followed by" Linked Group Details   2,000 mg 200 mL/hr over 120 Minutes Intravenous  Once 10/09/20 0744 10/09/20 1022   09/24/20 1000  ceFAZolin (ANCEF) IVPB 2g/100 mL premix  Status:  Discontinued        2 g 200 mL/hr over 30 Minutes Intravenous Every 12 hours 09/24/20 0801 09/24/20 1046   09/23/20 1400  vancomycin (VANCOCIN) IVPB 1000 mg/200 mL premix        1,000 mg 200 mL/hr over 60 Minutes Intravenous Every 24 hours 09/22/20 1436 09/28/20 1718   09/22/20 2200  ceFEPIme (MAXIPIME) 2 g in sodium chloride 0.9 % 100 mL IVPB  Status:  Discontinued        2 g 200 mL/hr over 30 Minutes Intravenous Every 12 hours 09/22/20 1436 09/24/20 0801   09/22/20  1030  ceFEPIme (MAXIPIME) 1 g in sodium chloride 0.9 % 100  mL IVPB        1 g 200 mL/hr over 30 Minutes Intravenous  Once 09/22/20 0934 09/22/20 1145   09/22/20 1015  vancomycin (VANCOCIN) IVPB 1000 mg/200 mL premix        1,000 mg 200 mL/hr over 60 Minutes Intravenous  Once 09/22/20 0934 09/22/20 1446   09/12/20 2200  ceFEPIme (MAXIPIME) 2 g in sodium chloride 0.9 % 100 mL IVPB        2 g 200 mL/hr over 30 Minutes Intravenous Every 12 hours 09/12/20 0732 09/14/20 2134   09/11/20 1400  ceFEPIme (MAXIPIME) 2 g in sodium chloride 0.9 % 100 mL IVPB  Status:  Discontinued        2 g 200 mL/hr over 30 Minutes Intravenous Every 8 hours 09/11/20 1126 09/12/20 0732   09/08/20 1000  vancomycin (VANCOREADY) IVPB 2000 mg/400 mL        2,000 mg 200 mL/hr over 120 Minutes Intravenous  Once 09/08/20 0857 09/08/20 1224   09/08/20 1000  ceFEPIme (MAXIPIME) 2 g in sodium chloride 0.9 % 100 mL IVPB  Status:  Discontinued        2 g 200 mL/hr over 30 Minutes Intravenous Every 12 hours 09/08/20 0857 09/11/20 1126   09/08/20 0856  vancomycin variable dose per unstable renal function (pharmacist dosing)  Status:  Discontinued         Does not apply See admin instructions 09/08/20 0857 09/09/20 0935   09/02/20 1600  cefTRIAXone (ROCEPHIN) 1 g in sodium chloride 0.9 % 100 mL IVPB  Status:  Discontinued        1 g 200 mL/hr over 30 Minutes Intravenous Every 24 hours 09/01/20 1811 09/02/20 0838   09/01/20 1800  fluconazole (DIFLUCAN) IVPB 400 mg        400 mg 50 mL/hr over 240 Minutes Intravenous  Once 09/01/20 1749 09/02/20 0603   09/01/20 1530  piperacillin-tazobactam (ZOSYN) IVPB 3.375 g        3.375 g 12.5 mL/hr over 240 Minutes Intravenous Every 8 hours 09/01/20 1514 09/05/20 2111   09/01/20 1000  levofloxacin (LEVAQUIN) tablet 250 mg  Status:  Discontinued        250 mg Oral Daily 08/31/20 1508 08/31/20 1735   08/31/20 1730  cefTRIAXone (ROCEPHIN) 1 g in sodium chloride 0.9 % 100 mL IVPB  Status:   Discontinued        1 g 200 mL/hr over 30 Minutes Intravenous Every 24 hours 08/31/20 1726 09/01/20 1513   08/31/20 1730  azithromycin (ZITHROMAX) 500 mg in sodium chloride 0.9 % 250 mL IVPB  Status:  Discontinued        500 mg 250 mL/hr over 60 Minutes Intravenous Every 24 hours 08/31/20 1726 09/02/20 0838       Objective: Vitals:   12/04/20 1010 12/04/20 1113 12/04/20 1136 12/04/20 1216  BP:   112/90 109/76  Pulse: (!) 138  (!) 133 (!) 103  Resp:  20 (!) 22 (!) 24  Temp: (!) 97.5 F (36.4 C)  98.4 F (36.9 C) 98.3 F (36.8 C)  TempSrc: Oral  Oral Oral  SpO2:   99% 100%  Height:        Intake/Output Summary (Last 24 hours) at 12/04/2020 1654 Last data filed at 12/04/2020 1326 Gross per 24 hour  Intake 1392.6 ml  Output 2441 ml  Net -1048.4 ml   Filed Weights    Examination: General exam: Patient is not in any distress.  Patient is awake and  alert. HEENT: Feeding tube, tracheostomy Respiratory system: Decreased air entry.   Cardiovascular system: S1 & S2  Gastrointestinal system: Abdomen is nondistended, soft and nontender.  Dressings, colostomy  Central nervous system: Alert and awake Extremities: No edema, no clubbing ,no cyanosis  Data Reviewed: I have personally reviewed following labs and imaging studies  CBC: Recent Labs  Lab 11/30/20 1248 12/04/20 0819  WBC 16.4* 16.6*  HGB 8.3* 7.5*  HCT 27.2* 24.2*  MCV 85.5 84.9  PLT 369 967*   Basic Metabolic Panel: Recent Labs  Lab 11/30/20 1248 12/01/20 0844 12/02/20 0226 12/03/20 0312 12/04/20 0111  NA 131* 132* 132* 137 137  K 3.4* 3.5 3.7 3.5 3.3*  CL 96* 95* 95* 100 107  CO2 23 24 23 24  19*  GLUCOSE 294* 354* 339* 194* 198*  BUN 108* 55* 66* 75* 73*  CREATININE 1.70* 1.26* 1.44* 1.35* 1.24*  CALCIUM 10.0 9.4 10.1 10.2 9.5  PHOS 4.5  --  4.3 4.5 4.3   GFR: Estimated Creatinine Clearance: 51 mL/min (A) (by C-G formula based on SCr of 1.24 mg/dL (H)). Liver Function Tests: Recent Labs  Lab  11/30/20 1248 12/02/20 0226 12/03/20 0312 12/04/20 0111  ALBUMIN 2.3* 2.2* 2.1* 2.1*   No results for input(s): LIPASE, AMYLASE in the last 168 hours. No results for input(s): AMMONIA in the last 168 hours. Coagulation Profile: No results for input(s): INR, PROTIME in the last 168 hours. Cardiac Enzymes: No results for input(s): CKTOTAL, CKMB, CKMBINDEX, TROPONINI in the last 168 hours. BNP (last 3 results) No results for input(s): PROBNP in the last 8760 hours. HbA1C: No results for input(s): HGBA1C in the last 72 hours. CBG: Recent Labs  Lab 12/03/20 1645 12/03/20 2034 12/03/20 2314 12/04/20 0317 12/04/20 1113  GLUCAP 230* 168* 237* 157* 124*   Lipid Profile: No results for input(s): CHOL, HDL, LDLCALC, TRIG, CHOLHDL, LDLDIRECT in the last 72 hours. Thyroid Function Tests: No results for input(s): TSH, T4TOTAL, FREET4, T3FREE, THYROIDAB in the last 72 hours. Anemia Panel: Recent Labs    12/02/20 0226  FERRITIN 1,014*  TIBC 308  IRON 35   Sepsis Labs: No results for input(s): PROCALCITON, LATICACIDVEN in the last 168 hours.  No results found for this or any previous visit (from the past 240 hour(s)).       Radiology Studies: No results found.      Scheduled Meds: . apixaban  2.5 mg Oral BID  . chlorhexidine gluconate (MEDLINE KIT)  15 mL Mouth Rinse BID  . Chlorhexidine Gluconate Cloth  6 each Topical Daily  . cholecalciferol  2,000 Units Oral Daily  . clonazepam  0.25 mg Oral QHS  . collagenase   Topical BID  . darbepoetin (ARANESP) injection - DIALYSIS  100 mcg Intravenous Q Sat-HD  . feeding supplement (NEPRO CARB STEADY)  237 mL Oral TID BM  . feeding supplement (PROSource TF)  45 mL Per Tube TID  . guaiFENesin  15 mL Oral Q12H  . insulin aspart  0-6 Units Subcutaneous TID WC  . mouth rinse  15 mL Mouth Rinse QID  . megestrol  200 mg Oral QID  . metoprolol tartrate  50 mg Oral BID  . midodrine  40 mg Oral 3 times per day on Mon Wed Fri  .  multivitamin  1 tablet Oral QHS  . pantoprazole sodium  40 mg Oral BID  . sodium chloride flush  10-40 mL Intracatheter Q12H   Continuous Infusions: . sodium chloride 50 mL/hr at 12/03/20 1011  .  albumin human 25 g (11/16/20 1516)  . ferric gluconate (FERRLECIT/NULECIT) IV 125 mg (12/04/20 1123)     LOS: 94 days    Time spent:35 mins.   Bonnell Public, MD [facility] P2/02/2021, 4:54 PM

## 2020-12-04 NOTE — Progress Notes (Signed)
Trach tube downsized to #4 cuffless Shiley Flex trach tube without complications.  Tube placement confirmed with bilateral breath sounds and positive color change on CO2 detector.

## 2020-12-04 NOTE — Progress Notes (Signed)
Wound vac alarming screen read air leak, assessed dressing, no air leak noted. Attempted to enable wound vac to start up, continued to read "air leak". Call place to PT suggested canister change.  Canister changed, problem not resolved. Asked for them to come assess.

## 2020-12-04 NOTE — Progress Notes (Signed)
PT Hydrotherapy Note  Called by nursing that [facility] was alarming. Went to check to troubleshoot VAC. Daughter reports that wound was having more bloody drainage than prior to last selective debridement by therapist. Due to bloody drainage removed [place] VAC. No active bleeding or oozing present. Small area of clotted blood at base of wound that likely was source. Redressed wound with xeroform at the base, saline guaze, and ABD pad. Will have primary hydrotherapy therapist see on Monday for further decisions about VAC. Nursing to change saline dressing until then. Will notify MD of removal of VAC.   [place] Pager [phone] Office [phone]

## 2020-12-04 NOTE — Plan of Care (Signed)
  Problem: Education: Goal: Utilization of techniques to improve thought processes will improve Outcome: Progressing Goal: Knowledge of the prescribed therapeutic regimen will improve Outcome: Progressing   Problem: Activity: Goal: Interest or engagement in leisure activities will improve Outcome: Progressing Goal: Imbalance in normal sleep/wake cycle will improve Outcome: Progressing   Problem: Coping: Goal: Coping ability will improve Outcome: Progressing Goal: Will verbalize feelings Outcome: Progressing   Problem: Health Behavior/Discharge Planning: Goal: Ability to make decisions will improve Outcome: Progressing Goal: Compliance with therapeutic regimen will improve Outcome: Progressing   Problem: Role Relationship: Goal: Will demonstrate positive changes in social behaviors and relationships Outcome: Progressing   Problem: Safety: Goal: Ability to identify and utilize support systems that promote safety will improve Outcome: Progressing   Problem: Self-Concept: Goal: Level of anxiety will decrease Outcome: Progressing   Problem: Education: Goal: Knowledge of General Education information will improve Description: Including pain rating scale, medication(s)/side effects and non-pharmacologic comfort measures Outcome: Progressing   Problem: Health Behavior/Discharge Planning: Goal: Ability to manage health-related needs will improve Outcome: Progressing   Problem: Clinical Measurements: Goal: Ability to maintain clinical measurements within normal limits will improve Outcome: Progressing Goal: Will remain free from infection Outcome: Progressing Goal: Diagnostic test results will improve Outcome: Progressing Goal: Respiratory complications will improve Outcome: Progressing Goal: Cardiovascular complication will be avoided Outcome: Progressing   Problem: Activity: Goal: Risk for activity intolerance will decrease Outcome: Progressing   Problem:  Nutrition: Goal: Adequate nutrition will be maintained Outcome: Progressing   Problem: Coping: Goal: Level of anxiety will decrease Outcome: Progressing   Problem: Elimination: Goal: Will not experience complications related to bowel motility Outcome: Progressing Goal: Will not experience complications related to urinary retention Outcome: Progressing   Problem: Pain Managment: Goal: General experience of comfort will improve Outcome: Progressing   Problem: Safety: Goal: Ability to remain free from injury will improve Outcome: Progressing   Problem: Skin Integrity: Goal: Risk for impaired skin integrity will decrease Outcome: Progressing   Problem: Activity: Goal: Ability to tolerate increased activity will improve Outcome: Progressing   Problem: Respiratory: Goal: Ability to maintain a clear airway and adequate ventilation will improve Outcome: Progressing   Problem: Role Relationship: Goal: Method of communication will improve Outcome: Progressing   Problem: Education: Goal: Knowledge of disease and its progression will improve Outcome: Progressing   Problem: Fluid Volume: Goal: Compliance with measures to maintain balanced fluid volume will improve Outcome: Progressing   Problem: Health Behavior/Discharge Planning: Goal: Ability to manage health-related needs will improve Outcome: Progressing   Problem: Nutritional: Goal: Ability to make healthy dietary choices will improve Outcome: Progressing   Problem: Clinical Measurements: Goal: Complications related to the disease process, condition or treatment will be avoided or minimized Outcome: Progressing   Problem: Education: Goal: Knowledge of risk factors and measures for prevention of condition will improve Outcome: Progressing   Problem: Coping: Goal: Psychosocial and spiritual needs will be supported Outcome: Progressing   Problem: Respiratory: Goal: Will maintain a patent airway Outcome:  Progressing Goal: Complications related to the disease process, condition or treatment will be avoided or minimized Outcome: Progressing

## 2020-12-05 DIAGNOSIS — I4891 Unspecified atrial fibrillation: Secondary | ICD-10-CM | POA: Diagnosis not present

## 2020-12-05 LAB — GLUCOSE, CAPILLARY
Glucose-Capillary: 132 mg/dL — ABNORMAL HIGH (ref 70–99)
Glucose-Capillary: 137 mg/dL — ABNORMAL HIGH (ref 70–99)
Glucose-Capillary: 173 mg/dL — ABNORMAL HIGH (ref 70–99)
Glucose-Capillary: 197 mg/dL — ABNORMAL HIGH (ref 70–99)
Glucose-Capillary: 182 mg/dL — ABNORMAL HIGH (ref 70–99)
Glucose-Capillary: 226 mg/dL — ABNORMAL HIGH (ref 70–99)

## 2020-12-05 LAB — RENAL FUNCTION PANEL
Albumin: 2.1 g/dL — ABNORMAL LOW (ref 3.5–5.0)
Anion gap: 11 (ref 5–15)
BUN: 35 mg/dL — ABNORMAL HIGH (ref 8–23)
CO2: 22 mmol/L (ref 22–32)
Calcium: 9.1 mg/dL (ref 8.9–10.3)
Chloride: 105 mmol/L (ref 98–111)
Creatinine, Ser: 0.95 mg/dL (ref 0.44–1.00)
GFR, Estimated: >60 mL/min (ref >60)
Glucose, Bld: 131 mg/dL — ABNORMAL HIGH (ref 70–99)
Phosphorus: 3.7 mg/dL (ref 2.5–4.6)
Potassium: 3.2 mmol/L — ABNORMAL LOW (ref 3.5–5.1)
Sodium: 138 mmol/L (ref 135–145)

## 2020-12-05 MED ORDER — HYDROMORPHONE HCL 1 MG/ML IJ SOLN
0.5000 mg | Freq: Once | INTRAMUSCULAR | Status: DC | PRN
  Filled 2020-12-05: qty 1

## 2020-12-05 NOTE — Progress Notes (Signed)
PROGRESS NOTE    Candace [name]  LAG:[id number] DOB: 1950/12/03 DOA: 08/31/2020 PCP: Algis Greenhouse, MD   Brief Narrative: Patient is a 70 year old female with past medical history significant for chronic kidney disease stage III, hypertension, hyperlipidemia, diabetes mellitus, colon cancer status post colon resection and colostomy bag placement.  Patient was admitted recently with COVID on 08/23/2020.  Patient was readmitted on 08/31/2020 with A. fib with RVR, hemorrhagic shock and perforated duodenal ulcer.  Patient underwent exploratory laparotomy on 09/01/2020.    Patient had an extensive ICU stay with significant events as below.  11/2 Admitted with rapid Afib 11/3 OR with findings of perforated duodenal ulcer 11/10 progressive hemorrhagic shock, intubated, transfused, pressors, proned; started on CRRT in PM 11/16 Extubated. Re-intubated overnight due to respiratory distress and hypoxia with decreased mentation 11/18 Bronch, cultures sent 11/19 Hgb down getting blood 11/24 Spiked fever, resume empirical antimicrobial therapy 11/26 Hemorrhagic shock, hgb 5.6, increased pressors,  11/30 Per palliative "[name] understanding that patient is unfortunately very fragiledespite ongoing intensive medical treatment and full mechanical support. [name] that the familywantsto continue with all current interventions despite potential outcomes". 12/08 CRRT discontinued due to clotting 12/09 Family requested transfer to tertiary care [facility]). [facility] denied transfer  12/10 CRRT restarted. Episodes of tachycardia, tachypnea that seem to improve with pain management 12/11 Back in shock. Pressor requirements up. CXR worse. ABX resumed 12/12 Still requiring inc pressors. Had hypoglycemic event. Basal insulin dosing adjusted 12/13 Pressor requirements better. Now hyperglycemic. Re-adjusted Glycemic control  12/19 Afebrile . Remains on dilaudid and heparin gtt, dilaudid gtt increased overnight  for concern of pain / ongoing tachycardia, no other events . NE and precedex off 12/17. Ongoing CRRT- even UF, dosing lokelmia this morning 12/20 On CRRT. Renal plans for HD cath and moving to HD. Getting wound care 12/24 - renal stopping CRRT today and plans HD 10/24/20 . 40% fio2 on vent via Trach. TAchypenic and tachycardic. Afebrile but wbc up to 27.6K. On TF. On heparin gtt 12/25 - Back on CRRT. On vent via trach at 40% fio2, On scheduled dilaudid as add on to oxy. Per family request 12/24 - they felt scheduled oxy was not adequate and patient was showing signs of opioid withdrawal.  Patient also had worsening SIRS/sepsis syndrome. Had fever, rising wbc, worsening RR 40-60 and HR 140s sinus->started On abx yesterday. Fever some better today. WBC plateau at 28,.5K. On new levophed ->signifanct improvement in HR 77 and RR t0 20. On heparin gtt. On precedex gtt. On levophed gtt 82mg wthi midodrine. On TF 12/30 Remains on CRRT with intermittent pressor requirements. Ethics consult placed 12/29. Ethics rec time trial of CRRT 12/31 failed SBT with RR 40s. Several conversations between care tam and family, who are upset RE plan of care 1/1 back on pressors  1/4: On pressors, keeping even on CVVHD, HGB drop to 6.9, transfused 1 unit. Improving mental status 1/10 remains on low dose levophed 273m 1/11 off levo, attempting HD with UF for first time. Now tolerating intermittent HD. 1/19 patient transferred to progressive bed with tracheostomy on 35% FiO2.  has NG tube feeding.  No PEG tube.  Receiving dialysis.  12/04/2020: Patient seen.  Also discussed with the patient's daughter extensively.  No new changes.  Patient underwent hemodialysis earlier today.  Patient is currently stable.  Patient's daughter was a bit worried about the presence of blood in patient's wound VAC.  Patient's daughter was reassured regarding the need to ensure that the wound care done  yesterday was such that viable tissue was  reached.  12/05/2020: Patient seen.  No new changes.  Leukocytosis persists.  Patient has unstageable sacral decubitus ulcer.  Patient is followed by the wound care team.   Assessment & Plan:   Principal Problem:   Atrial fibrillation with RVR ([place]) Active Problems:   Acute respiratory failure due to COVID-19 ([place])   New onset a-fib (H[facility])   Leukocytosis   Atrial fibrillation with rapid ventricular response (HCC)   Hypoxia   Pressure injury of skin   Acute hypoxemic respiratory failure (H[facility])   ARDS (adult respiratory distress syndrome) (H[facility])   Perforated duodenal ulcer ([place])   On mechanically assisted ventilation ([place])   Palliative care by specialist   Goals of care, counseling/discussion   Shock ([employer])   Acute and chronic respiratory failure (acute-on-chronic) ([employer])   Status post tracheostomy ([place])   ESRD (end stage renal disease) ([place])   HAP (hospital-acquired pneumonia)   Paroxysmal A. fib with RVR: -Hospital course remarkable for paroxysmal A. fib with RVR/intermittent sinus tachycardia. -Patient is on metoprolol tartrate 50 mg p.o. twice daily.   -Due to soft blood pressure, patient is also on midodrine.   -Cardiology recommended symptomatic treatment and beta-blocker titration as tolerated.   -Patient is on Eliquis 2.5 Mg p.o. twice daily. -We will monitor her on telemetry.    Acute respiratory secondary to Covid pneumonia:  -Status post tracheostomy. [facility] course remarkable for multiple bacterial pneumonias, ventilator dependent respiratory failure. -Completed antibiotic therapy. -Currently on trach collar on 28% FiO2.  Respiratory, PCCM following.  Continue pulmonary toileting, dysphagia therapy, suctioning.  Patient was able to tolerate Passy-Muir valve. -Tracheostomy downsized to size 4. -Chest x-ray done on 11/27/29  did not show any new pneumonia.   -Tracheal culture grew MSSA, treated with cefazolin, completed course on 12/03/2020.    Dysphagia/poor oral  intake: -Speech therapy following.  Passed barium swallow evaluation and was started on dysphagia 2 diet. -Continues to have poor oral intake.   -Patient is also on tube feeds. -Last albumin was 2.5.    Acute metabolic encephalopathy:  -Improved significantly. -Continue monitor mental status.    AKI, severe, requiring renal replacement therapy: -Nephrology following. -Patient is now on intermittent dialysis.  On midodrine for blood pressure support. -Family interested in dialysis at home but not feasible as per nephrology. -Current recommendation is for skilled nursing facility/LTAC level of care.  Social worker, nephrology closely following for this. -Status post tunneled catheter exchange. -Nephrology team is directing renal replacement therapy.  Pneumoperitoneum/perforated duodenal ulcer:status post exploratory laparotomy, adhesiolysis, modified Graham patch, repair of perforated ulcer on 11/3 by Dr. Bobbye Morton week.  Tolerating oral diet.  Surgery recommended twice daily wet-to-dry dressing of midline abdominal wound  Hemorrhagic shock: Resolved.  Blood pressure currently stable.  DVT of lower extremity: On Eliquis for anticoagulation.  Anemia of critical illness: Received multiple transfusions during this hospitalization.  Total of 12 units of PRBC.  Currently hemoglobin stable.  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  Possible LTAC.   -As per the daughter, she is  interested on taking her mom to home with home health.  Type 2 diabetes mellitus with hyperglycemia:  -Currently stable. -Continue to optimize.    Sacral  decubitus ulcer:  -Not present on admission.   -Has extensive sacral decubitus ulcer.   -Wound is unstageable. -Wound care  following. -Also seen by general  surgery.  Recommended against anesthesia/surgical procedure.  -Continue wound care.    History of stage IV colon cancer:  -Has colostomy.  Outpatient follow-up recommended  Nutrition Problem: Increased nutrient needs Etiology: wound healing  DVT prophylaxis:Eliquis Code Status: Full Family Communication: Daughter Status is: Inpatient  Remains inpatient appropriate because:Unsafe d/c plan   Dispo: The patient is from: Home              Anticipated d/c is to: LTAC vs Home with home health              Anticipated d/c date is: > 3 days              Patient currently is not medically stable to d/c.   Difficult to place patient Yes  Consultants: Cardiology, surgery, nephrology, ethics, PCCM  Procedures:R PICC 11/5 >> A line 11/9 >> out ETT 11/9 > 11/16, 11/16 >> 09/21/2020, 09/21/2020 tracheostomy>> Lt [place] CVL 11/9 >> R IJ trialysis >> out HD catheter 12/1 >>12/20 12/21 - 14.5 Fr, 23 cm right IJ tunneled hemodialysis catheter placement. Removal of indwelling subclavian catheter.   Antimicrobials:  Anti-infectives (From admission, onward)   Start     Dose/Rate Route Frequency Ordered Stop   11/27/20 2000  ceFAZolin (ANCEF) IVPB 1 g/50 mL premix        1 g 100 mL/hr over 30 Minutes Intravenous Every 24 hours 11/26/20 1248 12/03/20 2136   11/26/20 1345  ceFAZolin (ANCEF) IVPB 1 g/50 mL premix        1 g 100 mL/hr over 30 Minutes Intravenous  Once 11/26/20 1248 11/26/20 1433   11/17/20 1415  fluconazole (DIFLUCAN) 40 MG/ML suspension 200 mg        200 mg Oral  Once 11/17/20 1315 11/17/20 1357   11/03/20 2200  meropenem (MERREM) 500 mg in sodium chloride 0.9 % 100 mL IVPB        500 mg 200 mL/hr over 30 Minutes Intravenous Every 24 hours 11/03/20 1500 11/06/20 2217   11/01/20 1000  anidulafungin (ERAXIS) 100 mg in sodium chloride 0.9 % 100 mL IVPB  Status:  Discontinued       "Followed by" Linked Group Details   100 mg 78 mL/hr over 100 Minutes Intravenous Every 24 hours  10/31/20 0916 11/01/20 1404   10/31/20 1015  meropenem (MERREM) 1 g in sodium chloride 0.9 % 100 mL IVPB  Status:  Discontinued        1 g 200 mL/hr over 30 Minutes Intravenous Every 8 hours 10/31/20 0916 11/03/20 1500   10/31/20 1015  linezolid (ZYVOX) IVPB 600 mg  Status:  Discontinued        600 mg 300 mL/hr over 60 Minutes Intravenous Every 12 hours 10/31/20 0916 11/02/20 0906   10/31/20 1015  anidulafungin (ERAXIS) 200 mg in sodium chloride 0.9 % 200 mL IVPB       "Followed by" Linked Group Details   200 mg 78 mL/hr over 200 Minutes Intravenous  Once 10/31/20 0916 10/31/20 1630   10/25/20 1800  ceFAZolin (ANCEF) IVPB 2g/100 mL premix  Status:  Discontinued        2 g 200 mL/hr over 30 Minutes Intravenous Every 12 hours 10/25/20 1022 10/30/20 1103   10/23/20 2000  vancomycin (VANCOREADY) IVPB 750 mg/150 mL  Status:  Discontinued        750 mg 150 mL/hr over 60 Minutes Intravenous Every 24 hours 10/22/20 2036 10/25/20 1022   10/23/20  1800  piperacillin-tazobactam (ZOSYN) IVPB 3.375 g  Status:  Discontinued        3.375 g 100 mL/hr over 30 Minutes Intravenous Every 6 hours 10/23/20 1155 10/24/20 1422   10/23/20 0200  piperacillin-tazobactam (ZOSYN) IVPB 3.375 g  Status:  Discontinued        3.375 g 100 mL/hr over 30 Minutes Intravenous Every 8 hours 10/22/20 2036 10/23/20 1155   10/22/20 1630  vancomycin (VANCOREADY) IVPB 1500 mg/300 mL        1,500 mg 150 mL/hr over 120 Minutes Intravenous  Once 10/22/20 1537 10/22/20 2229   10/22/20 1630  piperacillin-tazobactam (ZOSYN) IVPB 2.25 g  Status:  Discontinued        2.25 g 100 mL/hr over 30 Minutes Intravenous Every 8 hours 10/22/20 1537 10/22/20 2036   10/22/20 1537  vancomycin variable dose per unstable renal function (pharmacist dosing)  Status:  Discontinued         Does not apply See admin instructions 10/22/20 1537 10/22/20 2036   10/19/20 0600  ceFAZolin (ANCEF) IVPB 2g/100 mL premix        2 g 200 mL/hr over 30 Minutes  Intravenous To Radiology 10/18/20 1457 10/19/20 0938   10/10/20 0830  vancomycin (VANCOCIN) IVPB 1000 mg/200 mL premix       "Followed by" Linked Group Details   1,000 mg 200 mL/hr over 60 Minutes Intravenous Every 24 hours 10/09/20 0744 10/15/20 0902   10/09/20 0830  piperacillin-tazobactam (ZOSYN) IVPB 3.375 g        3.375 g 100 mL/hr over 30 Minutes Intravenous Every 6 hours 10/09/20 0744 10/16/20 0038   10/09/20 0830  vancomycin (VANCOREADY) IVPB 2000 mg/400 mL       "Followed by" Linked Group Details   2,000 mg 200 mL/hr over 120 Minutes Intravenous  Once 10/09/20 0744 10/09/20 1022   09/24/20 1000  ceFAZolin (ANCEF) IVPB 2g/100 mL premix  Status:  Discontinued        2 g 200 mL/hr over 30 Minutes Intravenous Every 12 hours 09/24/20 0801 09/24/20 1046   09/23/20 1400  vancomycin (VANCOCIN) IVPB 1000 mg/200 mL premix        1,000 mg 200 mL/hr over 60 Minutes Intravenous Every 24 hours 09/22/20 1436 09/28/20 1718   09/22/20 2200  ceFEPIme (MAXIPIME) 2 g in sodium chloride 0.9 % 100 mL IVPB  Status:  Discontinued        2 g 200 mL/hr over 30 Minutes Intravenous Every 12 hours 09/22/20 1436 09/24/20 0801   09/22/20 1030  ceFEPIme (MAXIPIME) 1 g in sodium chloride 0.9 % 100 mL IVPB        1 g 200 mL/hr over 30 Minutes Intravenous  Once 09/22/20 0934 09/22/20 1145   09/22/20 1015  vancomycin (VANCOCIN) IVPB 1000 mg/200 mL premix        1,000 mg 200 mL/hr over 60 Minutes Intravenous  Once 09/22/20 0934 09/22/20 1446   09/12/20 2200  ceFEPIme (MAXIPIME) 2 g in sodium chloride 0.9 % 100 mL IVPB        2 g 200 mL/hr over 30 Minutes Intravenous Every 12 hours 09/12/20 0732 09/14/20 2134   09/11/20 1400  ceFEPIme (MAXIPIME) 2 g in sodium chloride 0.9 % 100 mL IVPB  Status:  Discontinued        2 g 200 mL/hr over 30 Minutes Intravenous Every 8 hours 09/11/20 1126 09/12/20 0732   09/08/20 1000  vancomycin (VANCOREADY) IVPB 2000 mg/400 mL  2,000 mg 200 mL/hr over 120 Minutes  Intravenous  Once 09/08/20 0857 09/08/20 1224   09/08/20 1000  ceFEPIme (MAXIPIME) 2 g in sodium chloride 0.9 % 100 mL IVPB  Status:  Discontinued        2 g 200 mL/hr over 30 Minutes Intravenous Every 12 hours 09/08/20 0857 09/11/20 1126   09/08/20 0856  vancomycin variable dose per unstable renal function (pharmacist dosing)  Status:  Discontinued         Does not apply See admin instructions 09/08/20 0857 09/09/20 0935   09/02/20 1600  cefTRIAXone (ROCEPHIN) 1 g in sodium chloride 0.9 % 100 mL IVPB  Status:  Discontinued        1 g 200 mL/hr over 30 Minutes Intravenous Every 24 hours 09/01/20 1811 09/02/20 0838   09/01/20 1800  fluconazole (DIFLUCAN) IVPB 400 mg        400 mg 50 mL/hr over 240 Minutes Intravenous  Once 09/01/20 1749 09/02/20 0603   09/01/20 1530  piperacillin-tazobactam (ZOSYN) IVPB 3.375 g        3.375 g 12.5 mL/hr over 240 Minutes Intravenous Every 8 hours 09/01/20 1514 09/05/20 2111   09/01/20 1000  levofloxacin (LEVAQUIN) tablet 250 mg  Status:  Discontinued        250 mg Oral Daily 08/31/20 1508 08/31/20 1735   08/31/20 1730  cefTRIAXone (ROCEPHIN) 1 g in sodium chloride 0.9 % 100 mL IVPB  Status:  Discontinued        1 g 200 mL/hr over 30 Minutes Intravenous Every 24 hours 08/31/20 1726 09/01/20 1513   08/31/20 1730  azithromycin (ZITHROMAX) 500 mg in sodium chloride 0.9 % 250 mL IVPB  Status:  Discontinued        500 mg 250 mL/hr over 60 Minutes Intravenous Every 24 hours 08/31/20 1726 09/02/20 0838       Objective: Vitals:   12/05/20 0355 12/05/20 0416 12/05/20 0737 12/05/20 0806  BP: 108/67   101/64  Pulse:  (!) 121  (!) 121  Resp:  (!) 23  (!) 23  Temp:    99.1 F (37.3 C)  TempSrc:    Oral  SpO2:  98% 98% 100%  Height:        Intake/Output Summary (Last 24 hours) at 12/05/2020 0955 Last data filed at 12/05/2020 0300 Gross per 24 hour  Intake --  Output 1991 ml  Net -1991 ml   Filed Weights    Examination: General exam: Patient is not in  any distress.  Patient is awake and alert. HEENT: Feeding tube, tracheostomy Respiratory system: Decreased air entry.   Cardiovascular system: S1 & S2  Gastrointestinal system: Abdomen is nondistended, soft and nontender.  Dressings, colostomy  Central nervous system: Alert and awake Extremities: No edema, no clubbing ,no cyanosis  Data Reviewed: I have personally reviewed following labs and imaging studies  CBC: Recent Labs  Lab 11/30/20 1248 12/04/20 0819  WBC 16.4* 16.6*  HGB 8.3* 7.5*  HCT 27.2* 24.2*  MCV 85.5 84.9  PLT 369 321*   Basic Metabolic Panel: Recent Labs  Lab 11/30/20 1248 12/01/20 0844 12/02/20 0226 12/03/20 0312 12/04/20 0111 12/05/20 0610  NA 131* 132* 132* 137 137 138  K 3.4* 3.5 3.7 3.5 3.3* 3.2*  CL 96* 95* 95* 100 107 105  CO2 23 24 23 24  19* 22  GLUCOSE 294* 354* 339* 194* 198* 131*  BUN 108* 55* 66* 75* 73* 35*  CREATININE 1.70* 1.26* 1.44* 1.35* 1.24* 0.95  CALCIUM  10.0 9.4 10.1 10.2 9.5 9.1  PHOS 4.5  --  4.3 4.5 4.3 3.7   GFR: Estimated Creatinine Clearance: 66.5 mL/min (by C-G formula based on SCr of 0.95 mg/dL). Liver Function Tests: Recent Labs  Lab 11/30/20 1248 12/02/20 0226 12/03/20 0312 12/04/20 0111 12/05/20 0610  ALBUMIN 2.3* 2.2* 2.1* 2.1* 2.1*   No results for input(s): LIPASE, AMYLASE in the last 168 hours. No results for input(s): AMMONIA in the last 168 hours. Coagulation Profile: No results for input(s): INR, PROTIME in the last 168 hours. Cardiac Enzymes: No results for input(s): CKTOTAL, CKMB, CKMBINDEX, TROPONINI in the last 168 hours. BNP (last 3 results) No results for input(s): PROBNP in the last 8760 hours. HbA1C: No results for input(s): HGBA1C in the last 72 hours. CBG: Recent Labs  Lab 12/04/20 1700 12/04/20 1945 12/04/20 2313 12/05/20 0351 12/05/20 0805  GLUCAP 173* 185* 171* 132* 137*   Lipid Profile: No results for input(s): CHOL, HDL, LDLCALC, TRIG, CHOLHDL, LDLDIRECT in the last 72  hours. Thyroid Function Tests: No results for input(s): TSH, T4TOTAL, FREET4, T3FREE, THYROIDAB in the last 72 hours. Anemia Panel: No results for input(s): VITAMINB12, FOLATE, FERRITIN, TIBC, IRON, RETICCTPCT in the last 72 hours. Sepsis Labs: No results for input(s): PROCALCITON, LATICACIDVEN in the last 168 hours.  No results found for this or any previous visit (from the past 240 hour(s)).   Radiology Studies: No results found.  Scheduled Meds: . apixaban  2.5 mg Oral BID  . chlorhexidine gluconate (MEDLINE KIT)  15 mL Mouth Rinse BID  . Chlorhexidine Gluconate Cloth  6 each Topical Daily  . cholecalciferol  2,000 Units Oral Daily  . clonazepam  0.25 mg Oral QHS  . collagenase   Topical BID  . darbepoetin (ARANESP) injection - DIALYSIS  100 mcg Intravenous Q Sat-HD  . feeding supplement (NEPRO CARB STEADY)  237 mL Oral TID BM  . feeding supplement (PROSource TF)  45 mL Per Tube TID  . guaiFENesin  15 mL Oral Q12H  . insulin aspart  0-6 Units Subcutaneous TID WC  . mouth rinse  15 mL Mouth Rinse QID  . megestrol  200 mg Oral QID  . metoprolol tartrate  50 mg Oral BID  . midodrine  40 mg Oral 3 times per day on Mon Wed Fri  . multivitamin  1 tablet Oral QHS  . pantoprazole sodium  40 mg Oral BID  . sodium chloride flush  10-40 mL Intracatheter Q12H   Continuous Infusions: . sodium chloride 50 mL/hr at 12/03/20 1011  . albumin human 25 g (11/16/20 1516)  . ferric gluconate (FERRLECIT/NULECIT) IV 125 mg (12/04/20 1123)     LOS: 95 days    Time spent:35 mins.   Bonnell Public, MD [facility] P2/03/2021, 9:55 AM

## 2020-12-05 NOTE — Progress Notes (Addendum)
Patient ID: Candace [name], female   DOB: 08/09/1951, 70 y.o.   MRN: [id number] [facility] Progress Note   Assessment/ Plan:   1.  End-stage renal disease from prolonged dialysis dependent acute kidney Injury: Suspected to be multifactorial acute kidney injury from septic shock/MRSA pneumonia and hemorrhagic shock at the outside.  Has been on renal replacement therapy since 09/08/2020 and recently able to transition to intermittent hemodialysis from CRRT.  Intermittent hemodialysis has been challenging due to hypotension and positional discomfort from decubitus ulcer.  Creatinine remains misleadingly low and is indicative of significant lean muscle mass loss with prolonged hospitalization; urine output incompletely charted.  She is not a candidate for CIR and family have no desire to have her admitted to SNF or LTAC and intend to take her home upon discharge.  Continue TTS dialysis while here; next treatment due 2/8. 2.  History of Covid pneumonia with ARDS: Status post tracheostomy and respiratory status appears to be stable with supplementation via T-bar. 3.  Atrial fibrillation with rapid ventricular response: Currently appears to be rate controlled on metoprolol.  On anticoagulation with Eliquis. 4.  Severe protein calorie malnutrition with large sacral decubitus ulcer: Ongoing nutritional supplementation with wound care. 5.  Anemia: Complicated by GI bleed from perforated duodenal ulcer status post exploratory laparotomy with Phillip Heal patch placement.  Slight downtrend of hemoglobin/hematocrit noted on labs from 3 days ago.  Subjective:   Uncomfortable at dialysis yesterday requiring constant repositioning-likely from decubitus ulcer   Objective:   BP 101/64 (BP Location: Left Arm)   Pulse (!) 121   Temp 99.1 F (37.3 C) (Oral)   Resp (!) 23   Ht 5' 8"  (1.727 m)   Wt 92.6 kg   SpO2 100%   BMI 31.04 kg/m   Intake/Output Summary (Last 24 hours) at 12/05/2020 0826 Last data filed  at 12/05/2020 0300 Gross per 24 hour  Intake --  Output 1991 ml  Net -1991 ml   Weight change:   Physical Exam: Gen: Appears comfortable resting in bed, able to answer questions appropriately CVS: Pulse irregularly irregular tachycardia, S1 and S2 normal with ejection systolic murmur Resp: Coarse/transmitted breath sounds bilaterally, no distinct rhonchi/rales.  Right IJ TDC in place Abd: Soft, obese, nontender Ext: Trace ankle edema, trace dependent edema  Imaging: No results found.  Labs: BMET Recent Labs  Lab 11/30/20 1248 12/01/20 0844 12/02/20 0226 12/03/20 0312 12/04/20 0111 12/05/20 0610  NA 131* 132* 132* 137 137 138  K 3.4* 3.5 3.7 3.5 3.3* 3.2*  CL 96* 95* 95* 100 107 105  CO2 23 24 23 24  19* 22  GLUCOSE 294* 354* 339* 194* 198* 131*  BUN 108* 55* 66* 75* 73* 35*  CREATININE 1.70* 1.26* 1.44* 1.35* 1.24* 0.95  CALCIUM 10.0 9.4 10.1 10.2 9.5 9.1  PHOS 4.5  --  4.3 4.5 4.3 3.7   CBC Recent Labs  Lab 11/30/20 1248 12/04/20 0819  WBC 16.4* 16.6*  HGB 8.3* 7.5*  HCT 27.2* 24.2*  MCV 85.5 84.9  PLT 369 417*    Medications:    . apixaban  2.5 mg Oral BID  . chlorhexidine gluconate (MEDLINE KIT)  15 mL Mouth Rinse BID  . Chlorhexidine Gluconate Cloth  6 each Topical Daily  . cholecalciferol  2,000 Units Oral Daily  . clonazepam  0.25 mg Oral QHS  . collagenase   Topical BID  . darbepoetin (ARANESP) injection - DIALYSIS  100 mcg Intravenous Q Sat-HD  . feeding supplement (NEPRO CARB  STEADY)  237 mL Oral TID BM  . feeding supplement (PROSource TF)  45 mL Per Tube TID  . guaiFENesin  15 mL Oral Q12H  . insulin aspart  0-6 Units Subcutaneous TID WC  . mouth rinse  15 mL Mouth Rinse QID  . megestrol  200 mg Oral QID  . metoprolol tartrate  50 mg Oral BID  . midodrine  40 mg Oral 3 times per day on Mon Wed Fri  . multivitamin  1 tablet Oral QHS  . pantoprazole sodium  40 mg Oral BID  . sodium chloride flush  10-40 mL Intracatheter Q12H   Elmarie Shiley,  MD 12/05/2020, 8:26 AM

## 2020-12-05 NOTE — Progress Notes (Signed)
Attempted to change patient's sacral dressing. Patient refused stating "only wound care can change that". Explained to patient that wound care will not be here to change her dressing today but she continued to refuse. Will reattempt later.

## 2020-12-05 NOTE — Plan of Care (Signed)

## 2020-12-06 DIAGNOSIS — I4891 Unspecified atrial fibrillation: Secondary | ICD-10-CM | POA: Diagnosis not present

## 2020-12-06 LAB — BLOOD CULTURE ID PANEL (REFLEXED) - BCID2

## 2020-12-06 LAB — RENAL FUNCTION PANEL
Albumin: 2.1 g/dL — ABNORMAL LOW (ref 3.5–5.0)
Anion gap: 10 (ref 5–15)
BUN: 35 mg/dL — ABNORMAL HIGH (ref 8–23)
CO2: 19 mmol/L — ABNORMAL LOW (ref 22–32)
Calcium: 9.5 mg/dL (ref 8.9–10.3)
Chloride: 107 mmol/L (ref 98–111)
Creatinine, Ser: 1.03 mg/dL — ABNORMAL HIGH (ref 0.44–1.00)
GFR, Estimated: 59 mL/min — ABNORMAL LOW
Glucose, Bld: 181 mg/dL — ABNORMAL HIGH (ref 70–99)
Phosphorus: 3.7 mg/dL (ref 2.5–4.6)
Potassium: 3.4 mmol/L — ABNORMAL LOW (ref 3.5–5.1)
Sodium: 136 mmol/L (ref 135–145)

## 2020-12-06 LAB — CBC WITH DIFFERENTIAL/PLATELET
Abs Immature Granulocytes: 0.13 K/uL — ABNORMAL HIGH (ref 0.00–0.07)
Basophils Absolute: 0 K/uL (ref 0.0–0.1)
Basophils Relative: 0 %
Eosinophils Absolute: 0.2 K/uL (ref 0.0–0.5)
Eosinophils Relative: 2 %
HCT: 26.2 % — ABNORMAL LOW (ref 36.0–46.0)
Hemoglobin: 7.7 g/dL — ABNORMAL LOW (ref 12.0–15.0)
Immature Granulocytes: 1 %
Lymphocytes Relative: 17 %
Lymphs Abs: 2.3 K/uL (ref 0.7–4.0)
MCH: 25.8 pg — ABNORMAL LOW (ref 26.0–34.0)
MCHC: 29.4 g/dL — ABNORMAL LOW (ref 30.0–36.0)
MCV: 87.6 fL (ref 80.0–100.0)
Monocytes Absolute: 1.1 K/uL — ABNORMAL HIGH (ref 0.1–1.0)
Monocytes Relative: 8 %
Neutro Abs: 9.9 K/uL — ABNORMAL HIGH (ref 1.7–7.7)
Neutrophils Relative %: 72 %
Platelets: 355 K/uL (ref 150–400)
RBC: 2.99 MIL/uL — ABNORMAL LOW (ref 3.87–5.11)
RDW: 17 % — ABNORMAL HIGH (ref 11.5–15.5)
WBC: 13.7 K/uL — ABNORMAL HIGH (ref 4.0–10.5)
nRBC: 0.2 % (ref 0.0–0.2)

## 2020-12-06 LAB — GLUCOSE, CAPILLARY
Glucose-Capillary: 137 mg/dL — ABNORMAL HIGH (ref 70–99)
Glucose-Capillary: 115 mg/dL — ABNORMAL HIGH (ref 70–99)
Glucose-Capillary: 140 mg/dL — ABNORMAL HIGH (ref 70–99)
Glucose-Capillary: 220 mg/dL — ABNORMAL HIGH (ref 70–99)
Glucose-Capillary: 155 mg/dL — ABNORMAL HIGH (ref 70–99)
Glucose-Capillary: 139 mg/dL — ABNORMAL HIGH (ref 70–99)

## 2020-12-06 MED ORDER — CHLORHEXIDINE GLUCONATE CLOTH 2 % EX PADS
6.0000 | MEDICATED_PAD | Freq: Every day | CUTANEOUS | Status: DC
  Administered 2020-12-08 – 2020-12-10 (×3): 6 via TOPICAL

## 2020-12-06 MED ORDER — VANCOMYCIN HCL 2000 MG/400ML IV SOLN
2000.0000 mg | Freq: Once | INTRAVENOUS | Status: AC
  Administered 2020-12-07: 2000 mg via INTRAVENOUS
  Filled 2020-12-06 (×2): qty 400

## 2020-12-06 MED ORDER — JUVEN PO PACK
1.0000 | PACK | Freq: Two times a day (BID) | ORAL | Status: DC
  Administered 2020-12-06 – 2020-12-19 (×21): 1
  Filled 2020-12-06 (×23): qty 1

## 2020-12-06 MED ORDER — NEPRO/CARBSTEADY PO LIQD
780.0000 mL | ORAL | Status: DC
  Administered 2020-12-06 – 2020-12-08 (×2): 780 mL
  Filled 2020-12-06: qty 948

## 2020-12-06 MED ORDER — VANCOMYCIN HCL IN DEXTROSE 1-5 GM/200ML-% IV SOLN
1000.0000 mg | INTRAVENOUS | Status: DC
  Filled 2020-12-06: qty 200

## 2020-12-06 NOTE — Progress Notes (Signed)
Pt is saturating appropriately on RA with PMV in place. Pt requested tp be taken off of trach collar during the day and then placed on trach collar for night time use. RT will monitor.

## 2020-12-06 NOTE — Progress Notes (Signed)
Occupational Therapy Treatment Patient Details Name: Candace [name] MRN: [id number] DOB: 09-May-1951 Today's Date: 12/06/2020    History of present illness Pt 70 y.o. female with medical history significant for recent covid pna, obesity, colon cancer s/p colon resection with colostomy bag, HLD, NIDDM2, CKD3 presented to ED after her follow-up nurse advised her to present to ED for elevated HR. +afib, elevated troponins with demand ischemia,  CT scan is positive for bowel perforation with pneumomediastinum 11/03 exp lap with repair of perforated ulcer. 11/10 t/f to ICU- intubated/sedated/proned. Trach placed 11/24. CRRT off 12/8, restarted 12/10-1/5. Pt now on progressive care unti.   OT comments  Pt limited by pain at sacral wound today. Pt had just attempted maximove to chair with PT prior to OT session but unable to tolerate sitting up in chair today. Demonstrated other manual resistance exercises for UE that family can assist pt with focusing on shoulder flexion/extension. Guided pt in self feeding tasks with u cuff then built up red foam grip with improving grasp/coordination with proper positioning. Pt unable to tolerate chair position for self feeding secondary to pain so assisted pt to sidelying with HOB elevated and daughter assisting with remainder of meal. Pt's daughter proficient in bed mobility and repositioning pt. Plan to provide further hand strength and digit coordination exercises, as well as problem solve being able to write with a pen (a valued task per pt). Plan to also trial standing attempts as appropriate.    Follow Up Recommendations  LTACH;SNF;Supervision/Assistance - 24 hour;Other (comment) (HHOT if pt/family still decline SNF)    Equipment Recommendations  Wheelchair (measurements OT);Wheelchair cushion (measurements OT);Hospital bed;Other (comment) (tilt in space power chair, air mattress and air cusion)    Recommendations for Other Services      Precautions / Restrictions  Precautions Precautions: Fall Precaution Comments: baseline colostomy; sacral wound with wound vac, trach with PMV Restrictions Weight Bearing Restrictions: No       Mobility Bed Mobility Overal bed mobility: Needs Assistance Bed Mobility: Rolling Rolling: Mod assist;+2 for physical assistance;+2 for safety/equipment         General bed mobility comments: Mod A x 2 for rolling and repositioning in bed. Daughter proficient in rolling and positioning pt with pillows in bed  Transfers                 General transfer comment: Had just attempted maximove to chair and back to bed with PT. Pt unable to tolerate sitting due to pain at sacral wound site    Balance                                           ADL either performed or assessed with clinical judgement   ADL Overall ADL's : Needs assistance/impaired Eating/Feeding: Minimal assistance;Bed level Eating/Feeding Details (indicate cue type and reason): Min A overall for positioning though progressing to supervision. Pt utilized U cuff then assisted in trialing built up red foam grip. Placed ice cream cup in lap with pt able to scoop from cup using L hand. Reporting too much pain in bottom to continue sitting straight in bed. assisted to sidelying with HOB elevated and daughter assisting with remainder of self feeding  General ADL Comments: Focus on self feeding, education on manual exercises daughter can assist pt with. Daughter proficient in repositioning pt in bed and use of bed controls. Plan to provide squeeze ball, putty HEP and trial of pen for writing during next session     Vision   Vision Assessment?: No apparent visual deficits   Perception     Praxis      Cognition Arousal/Alertness: Awake/alert Behavior During Therapy: WFL for tasks assessed/performed Overall Cognitive Status: Impaired/Different from baseline Area of Impairment:  Attention;Memory;Following commands;Safety/judgement;Awareness;Problem solving                   Current Attention Level: Selective Memory: Decreased short-term memory Following Commands: Follows one step commands with increased time;Follows one step commands consistently Safety/Judgement: Decreased awareness of safety;Decreased awareness of deficits Awareness: Emergent Problem Solving: Slow processing;Decreased initiation;Difficulty sequencing;Requires verbal cues;Requires tactile cues General Comments: Pt sustaining attention to self feeding task though limited by sacral wound pain. Follows one step commands and able to make needs known        Exercises     Shoulder Instructions       General Comments Educated on possible trial of Stedy during next session to progress mobility as appropriate    Pertinent Vitals/ Pain       Pain Assessment: Faces Faces Pain Scale: Hurts even more Pain Location: bottom Pain Descriptors / Indicators: Grimacing Pain Intervention(s): Monitored during session;Limited activity within patient's tolerance  Home Living                                          Prior Functioning/Environment              Frequency  Min 2X/week        Progress Toward Goals  OT Goals(current goals can now be found in the care plan section)  Progress towards OT goals: Progressing toward goals  Acute Rehab OT Goals Patient Stated Goal: be able to write, be able to be more independent OT Goal Formulation: With patient/family Time For Goal Achievement: 12/10/20 Potential to Achieve Goals: Fair ADL Goals Pt Will Perform Grooming: with mod assist;bed level;sitting Pt Will Perform Upper Body Bathing: with max assist;sitting;bed level Pt Will Perform Upper Body Dressing: with set-up;with supervision;sitting Pt Will Transfer to Toilet: with max assist;with +2 assist;squat pivot transfer;bedside commode Pt Will Perform Toileting - Clothing  Manipulation and hygiene: with min guard assist;sitting/lateral leans;sit to/from stand Pt Will Perform Tub/Shower Transfer: with supervision;ambulating;shower seat;rolling walker Pt/caregiver will Perform Home Exercise Program: Increased strength;Both right and left upper extremity;With written HEP provided;With theraband Additional ADL Goal #1: Pt will sustain attention to ADL task with min VCs Additional ADL Goal #2: Pt will sit EOB for 5 mins with mod A for trunk support with VSS Additional ADL Goal #3: Pt will complete bed mobility with mod A as precursor to ADLs  Plan Discharge plan remains appropriate    Co-evaluation                 AM-PAC OT "6 Clicks" Daily Activity     Outcome Measure   Help from another person eating meals?: A Little Help from another person taking care of personal grooming?: A Lot Help from another person toileting, which includes using toliet, bedpan, or urinal?: Total Help from another person bathing (including washing, rinsing, drying)?: Total Help from another person to put  on and taking off regular upper body clothing?: Total Help from another person to put on and taking off regular lower body clothing?: Total 6 Click Score: 9    End of Session    OT Visit Diagnosis: Unsteadiness on feet (R26.81);Other abnormalities of gait and mobility (R26.89);Muscle weakness (generalized) (M62.81);Other symptoms and signs involving cognitive function   Activity Tolerance Patient limited by pain   Patient Left in bed;with call bell/phone within reach;with family/visitor present   Nurse Communication Mobility status;Other (comment) (buttocks pain)        Time: 9024-0973 OT Time Calculation (min): 35 min  Charges: OT General Charges $OT Visit: 1 Visit OT Treatments $Self Care/Home Management : 8-22 mins $Therapeutic Activity: 8-22 mins  [name], OTR/L   [name] 12/06/2020, 2:08 PM

## 2020-12-06 NOTE — Progress Notes (Signed)
  Speech Language Pathology Treatment: Dysphagia;Passy Muir Speaking valve  Patient Details Name: Candace [name] MRN: [id number] DOB: 1951/09/15 Today's Date: 12/06/2020 Time: 6283-6629 SLP Time Calculation (min) (ACUTE ONLY): 23 min  Assessment / Plan / Recommendation Clinical Impression  Pt was seen for treatment. Her trach was downsized to uncuffed #4 Shiley Flex on 2/4 and pt reported that she feels that phonation has been easier since. Pt's breakfast tray was at bedside, but pt requested that she not have it as yet. Pt stated, "in a minute" throughout the duration of the session when it was offered. Pt tolerated 9/10 boluses of thin liquids via straw without overt s/sx of aspiration when HOB was 40 degrees; throat clearing was noted once. Pt was able to tolerate this position for ~10 minutes without significant pain and HOB was reclined to 25 degrees per her request. Pt exhibited coughing and/or throat clearing with 50% of thin liquids boluses thereafter. PMSV was in place upon SLP's entry. Pt's vocal quality was improved during this session. She required intermittent cueing to adequately coordinate respiration with speech. She demonstrated 75% accuracy with use of breath support at the sentence level increasing to 100% with cues. Voice breaks were noted during production of longer utterances. Pt's overall vocal intensity was improved compared to her last session and repetition was less frequently required. Pt may continue regular texture solids and nectar thick liquids. Thin water may be continued between meals after oral care only when pt is upright. SLP will continue to follow pt.    HPI HPI: 70 year old female who was previously diagnosed with Covid 08/23/2020.  Admitted 11/2 and intubated with AF-RVR, found to have a perforated duodenal underwent exploratory laparotomy with Phillip Heal patch placement 11/3, Intubated for 20 days until 11/23 then trach placed. Prolonged ventilation, until 1/17. Profoundly  deconditioned. Trach tube downsized to #4 cuffless Shiley Flex on 2/4.      SLP Plan  Continue with current plan of care       Recommendations  Diet recommendations: Regular;Nectar-thick liquid (Thin water between meals after oral care if pt is able to be upright) Liquids provided via: Cup;Straw Medication Administration: Whole meds with puree Supervision: Staff to assist with self feeding;Full supervision/cueing for compensatory strategies Compensations: Slow rate;Small sips/bites Postural Changes and/or Swallow Maneuvers: Seated upright 90 degrees      Patient may use Passy-Muir Speech Valve: During all waking hours (remove during sleep);During PO intake/meals PMSV Supervision: Full         Oral Care Recommendations: Oral care BID Follow up Recommendations: 24 hour supervision/assistance;Home health SLP SLP Visit Diagnosis: Dysphagia, unspecified (R13.10);Aphonia (R49.1) Plan: Continue with current plan of care       Candace [name] I. Hardin Negus, [place], [place] Office number [phone] Pager [place] 12/06/2020, 9:09 AM

## 2020-12-06 NOTE — Progress Notes (Signed)
Physical Therapy Wound Treatment Patient Details  Name: Candace [name] MRN: [id number] Date of Birth: [birth year]  Today's Date: 12/06/2020 Time: 1020-1132 Time Calculation (min): 72 min  Subjective  Subjective: Pt/pt daughter would like her premedicated 1.5 hour before treatment Patient and Family Stated Goals: heal wound Date of Onset:  (unknown) Prior Treatments: prior hydro and Dakins  Pain Score:  2/10  Wound Assessment  Pressure Injury 09/25/20 Sacrum Bilateral;Medial Deep Tissue Pressure Injury - Purple or maroon localized area of discolored intact skin or blood-filled blister due to damage of underlying soft tissue from pressure and/or shear. Purple, non-blanchable, b (Active)  Wound Image   12/06/20 1256  Dressing Type Negative pressure wound therapy 12/06/20 1256  Dressing Changed 12/06/20 1256  Dressing Change Frequency Monday, Wednesday, Friday 12/06/20 1256  State of Healing Early/partial granulation 12/06/20 1256  Site / Wound Assessment Yellow;Red;Pink;Black 12/06/20 1256  % Wound base Red or Granulating 30% 12/06/20 1256  % Wound base Yellow/Fibrinous Exudate 50% 12/06/20 1256  % Wound base Black/Eschar 20% 12/06/20 1256  % Wound base Other/Granulation Tissue (Comment) 10% 12/03/20 1405  Peri-wound Assessment Intact 12/06/20 1256  Wound Length (cm) 14 cm 12/01/20 1722  Wound Width (cm) 12 cm 12/01/20 1722  Wound Depth (cm) 6 cm 12/01/20 1722  Wound Surface Area (cm^2) 168 cm^2 12/01/20 1722  Wound Volume (cm^3) 1008 cm^3 12/01/20 1722  Tunneling (cm) 0 12/01/20 1722  Undermining (cm) 0 12/01/20 1722  Margins Unattached edges (unapproximated) 12/06/20 1256  Drainage Amount Minimal 12/06/20 1256  Drainage Description Sanguineous 12/06/20 1256  Treatment Debridement (Selective);Negative pressure wound therapy 12/06/20 1256      Selective Debridement Selective Debridement - Location: sacrum Selective Debridement - Tools Used: Forceps;Scalpel Selective Debridement -  Tissue Removed: necrotic adipose, eschar   Wound Assessment and Plan  Wound Therapy - Assess/Plan/Recommendations Wound Therapy - Clinical Statement: Wound continues to demonstrate improvement with granulation tissue increasing to 30%; reapplied wound vac. Continue plan. Wound Therapy - Functional Problem List: Global weakness and immobility Factors Delaying/Impairing Wound Healing: Diabetes Mellitus;Immobility;Multiple medical problems;Other (comment) (poor nutrition) Hydrotherapy Plan: Debridement;Pulsatile lavage with suction;Patient/family education;Dressing change Wound Therapy - Frequency: 6X / week Wound Therapy - Follow Up Recommendations: Skilled nursing facility Wound Plan: see above  Wound Therapy Goals- Improve the function of patient's integumentary system by progressing the wound(s) through the phases of wound healing (inflammation - proliferation - remodeling) by: Decrease Necrotic Tissue to: 50% Decrease Necrotic Tissue - Progress: Progressing toward goal Increase Granulation Tissue to: 50% Increase Granulation Tissue - Progress: Progressing toward goal Goals/treatment plan/discharge plan were made with and agreed upon by patient/family: Yes Time For Goal Achievement: 7 days Wound Therapy - Potential for Goals: Fair  Goals will be updated until maximal potential achieved or discharge criteria met.  Discharge criteria: when goals achieved, discharge from hospital, MD decision/surgical intervention, no progress towards goals, refusal/missing three consecutive treatments without notification or medical reason.  GP    [name], PT, DPT Acute Rehabilitation Services Pager [phone] Office [phone]   [name] 12/06/2020, 1:00 PM

## 2020-12-06 NOTE — Progress Notes (Signed)
Pt refusing ATC overnight, wants to keep PMV on. RT will continue to monitor.

## 2020-12-06 NOTE — Progress Notes (Signed)
Physical Therapy Treatment Patient Details Name: Candace [name] MRN: [id number] DOB: 1950/11/15 Today's Date: 12/06/2020    History of Present Illness Pt 70 y.o. female with medical history significant for recent covid pna, obesity, colon cancer s/p colon resection with colostomy bag, HLD, NIDDM2, CKD3 presented to ED after her follow-up nurse advised her to present to ED for elevated HR. +afib, elevated troponins with demand ischemia,  CT scan is positive for bowel perforation with pneumomediastinum 11/03 exp lap with repair of perforated ulcer. 11/10 t/f to ICU- intubated/sedated/proned. Trach placed 11/24. CRRT off 12/8, restarted 12/10-1/5. Pt now on progressive care unti.    PT Comments    Pt admitted with above diagnosis. Pt was unable to tolerate staying in recliner after used Maxi move to get her OOB.  Pt had incr sacral pain and could not position her with pillows in chair. Moved her back to bed and placed her on left side to decr discomfort.  Daughter asking if there was a cushion that could help other than geomat. She mentioned donut cushion which is the only other cushion that hospital can readily obtain therefore asked nursing secretary to order one for pt. Also discussed the wheelchair appt with pt that is scheduled for tomorrow as this will open up opportunities for pt to get up and have pressure relief.  Did explain to daughter that this process takes times as well. Will continue to progress pt as able.  Pt currently with functional limitations due to balance and endurance deficits. Pt will benefit from skilled PT to increase their independence and safety with mobility to allow discharge to the venue listed below.     Follow Up Recommendations  SNF;LTACH;Other (comment)     Equipment Recommendations  Wheelchair (measurements PT);Wheelchair cushion (measurements PT);Hospital bed;Other (comment) (air mattress, hoyer lift, a custom WC 18x18 tilt in space with roho cushion)     Recommendations for Other Services OT consult     Precautions / Restrictions Precautions Precautions: Fall Precaution Comments: baseline colostomy; sacral wound with wound vac, trach with PMV Restrictions Weight Bearing Restrictions: No    Mobility  Bed Mobility Overal bed mobility: Needs Assistance Bed Mobility: Rolling Rolling: Mod assist;+2 for physical assistance;+2 for safety/equipment         General bed mobility comments: Mod A for rolling and repositioning in bed. Had to place the MAximove pad so PT could lift pt OOB. Daughter proficient in rolling and positioning pt with pillows in bed  Transfers Overall transfer level: Needs assistance Equipment used: Ambulation equipment used             General transfer comment: Had just attempted maximove to chair and had to get pt back to bed as pt was  unable to tolerate sitting due to pain at sacral wound site  Ambulation/Gait                 Stairs             Wheelchair Mobility    Modified Rankin (Stroke Patients Only)       Balance Overall balance assessment: Needs assistance Sitting-balance support: Bilateral upper extremity supported;Feet unsupported Sitting balance-Leahy Scale: Zero Sitting balance - Comments: Required assist to sit EOB once placed back on bed in Maxi move. Postural control: Posterior lean                                  Cognition Arousal/Alertness: Awake/alert  Behavior During Therapy: WFL for tasks assessed/performed Overall Cognitive Status: Impaired/Different from baseline Area of Impairment: Attention;Memory;Following commands;Safety/judgement;Awareness;Problem solving                 Orientation Level:  (pt able to state her daughter was there, but not her name) Current Attention Level: Selective Memory: Decreased short-term memory Following Commands: Follows one step commands with increased time;Follows one step commands  consistently Safety/Judgement: Decreased awareness of safety;Decreased awareness of deficits Awareness: Emergent Problem Solving: Slow processing;Decreased initiation;Difficulty sequencing;Requires verbal cues;Requires tactile cues General Comments: Pt sustaining attention to self feeding task though limited by sacral wound pain. Follows one step commands and able to make needs known      Exercises General Exercises - Lower Extremity Ankle Circles/Pumps: AAROM;Both;10 reps;Supine Quad Sets: AROM;Both;10 reps;Supine Heel Slides: AAROM;Both;10 reps;Supine Hip ABduction/ADduction: AAROM;Both;10 reps;Supine Straight Leg Raises: Both;5 reps;AAROM;Supine    General Comments General comments (skin integrity, edema, etc.): Educated on possible trial of Stedy during next session to progress mobility as appropriate      Pertinent Vitals/Pain Pain Assessment: Faces Faces Pain Scale: Hurts worst Pain Location: bottom Pain Descriptors / Indicators: Grimacing Pain Intervention(s): Limited activity within patient's tolerance;Monitored during session;Repositioned;Patient requesting pain meds-RN notified    Home Living                      Prior Function            PT Goals (current goals can now be found in the care plan section) Acute Rehab PT Goals Patient Stated Goal: be able to write, be able to be more independent PT Goal Formulation: With patient/family Time For Goal Achievement: 12/15/20 Potential to Achieve Goals: Fair Progress towards PT goals: Not progressing toward goals - comment (sacral wound pain limiting pt)    Frequency    Min 2X/week      PT Plan Current plan remains appropriate    Co-evaluation              AM-PAC PT "6 Clicks" Mobility   Outcome Measure  Help needed turning from your back to your side while in a flat bed without using bedrails?: A Lot Help needed moving from lying on your back to sitting on the side of a flat bed without using  bedrails?: Total Help needed moving to and from a bed to a chair (including a wheelchair)?: Total Help needed standing up from a chair using your arms (e.g., wheelchair or bedside chair)?: Total Help needed to walk in hospital room?: Total Help needed climbing 3-5 steps with a railing? : Total 6 Click Score: 7    End of Session Equipment Utilized During Treatment: Other (comment) (Maximove) Activity Tolerance: Patient limited by pain;Patient limited by fatigue Patient left: with call bell/phone within reach;with family/visitor present;in bed Nurse Communication: Mobility status;Need for lift equipment PT Visit Diagnosis: Muscle weakness (generalized) (M62.81);Difficulty in walking, not elsewhere classified (R26.2);Pain;Adult, failure to thrive (R62.7);Unsteadiness on feet (R26.81) Pain - Right/Left: Right Pain - part of body: Knee     Time: 0350-0938 PT Time Calculation (min) (ACUTE ONLY): 57 min  Charges:  $Therapeutic Exercise: 23-37 mins $Therapeutic Activity: 23-37 mins                     Gini Caputo W,PT Acute Rehabilitation Services Pager:  [phone]  Office:  [place] 12/06/2020, 4:04 PM

## 2020-12-06 NOTE — Progress Notes (Addendum)
[facility] PROGRESS NOTE  Candace [name] JWJ:[id number] DOB: 03/09/1951 DOA: 08/31/2020 PCP: Algis Greenhouse, MD            Status: Remains inpatient appropriate because:Ongoing diagnostic testing needed not appropriate for outpatient work up, Unsafe d/c plan, IV treatments appropriate due to intensity of illness or inability to take PO and Inpatient level of care appropriate due to severity of illness   Dispo: The patient is from: Home              Anticipated d/c is to: Home with home health               Anticipated d/c date is: > 3 days              Patient currently is not medically stable to d/c.  Barriers to discharge: Continues to require HD- unable to sit in chair/get in [facility] for OP HD-also requiring Cortrak for nocturnal feeds; still has trach with copiuous secretions at times  Considerations for home discharge: 1. Patient will need to continue wound VAC and current wound VAC is apparently not available in the home setting 2. It is likely she will need a specialty bed.  We are currently transitioning back to the [facility] rotational bed which will allow for better in bed mobility including the ability to "stand" with PT in the bed without actually getting patient out of bed 3. Likely would need to be decannulated to minimize other care issues after discharge 4. Would need to be able to either mobilize for outpatient hemodialysis or be a candidate for home dialysis 5. Would not have aggressive PT OT available in the home setting that she can achieve during hospitalization 6. Patient will have family members that are CNAs managing her after discharge but this would be 24/7 requirement and this could be an overwhelming issue once patient is actually home  Code Status: Full Family Communication: 2/3 daughter [name] at bedside-extensive conversation held at bedside with daughter to include myself, Retail buyer, Editor, commissioning, and [facility] personnel regarding expectations  for IP care and dc planning DVT prophylaxis: Eliquis Vaccination status: Has not been vaccinated but did have severe COVID infection initially diagnosed on 08/23/2020.  Patient will be eligible for COVID-vaccine after 11/23/2020   Foley catheter: No, purewick female urinary collection device   HPI: 70 year old female patient with prior history of diabetes, stage III chronic kidney disease, atrial fibrillation, hypertension, dyslipidemia and colon cancer that is post colectomy and colostomy.  Initially diagnosed with COVID on 08/23/20 she was admitted to the hospital due to dehydration, acute kidney injury and hypoxemic respiratory failure.  She was discharged home on 2 to 3 L of oxygen after being treated with IV steroids and remdesivir.  During that time although she had elevated inflammatory markers she had no embolic or thrombotic disease.  She had been on prophylactic dose heparin during the hospitalization and was placed on Eliquis for 2 weeks for after discharge  Patient returned to the ER on 08/31/2020 for complaints of not feeling well.  She was found to be in atrial fibrillation with RVR, hemorrhagic shock and work-up revealed a perforated duodenal ulcer.  She underwent exploratory laparotomy on 11/3.  She has had an extensive ICU stay with significant events as below.   11/2 Admitted with rapid Afib 11/3 OR with findings of perforated duodenal ulcer 11/10 progressive hemorrhagic shock, intubated, transfused, pressors, proned; started on CRRT in PM 11/16 Extubated. Re-intubated overnight due to respiratory distress  and hypoxia with decreased mentation 11/18 Bronch, cultures sent 11/19 Hgb down getting blood 11/24 Spiked fever, resume empirical antimicrobial therapy 11/26 Hemorrhagic shock, hgb 5.6, increased pressors,  11/30 Per palliative "[name] understanding that patient is unfortunately very fragiledespite ongoing intensive medical treatment and full mechanical support.  [name] that the familywantsto continue with all current interventions despite potential outcomes". 12/08 CRRT discontinued due to clotting 12/09 Family requested transfer to tertiary care [facility] denied transfer  12/10 CRRT restarted. Episodes of tachycardia, tachypnea that seem to improve with pain management 12/11 Back in shock. Pressor requirements up. CXR worse. ABX resumed 12/12 Still requiring inc pressors. Had hypoglycemic event. Basal insulin dosing adjusted 12/13 Pressor requirements better. Now hyperglycemic. Re-adjusted Glycemic control  12/19 Afebrile . Remains on dilaudid and heparin gtt, dilaudid gtt increased overnight for concern of pain / ongoing tachycardia, no other events . NE and precedex off 12/17. Ongoing CRRT- even UF, dosing lokelmia this morning 12/20 On CRRT. Renal plans for HD cath and moving to HD. Getting wound care 12/24 - renal stopping CRRT today and plans HD 10/24/20 . 40% fio2 on vent via Trach. TAchypenic and tachycardic. Afebrile but wbc up to 27.6K. On TF. On heparin gtt 12/25 - Back on CRRT. On vent via trach at 40% fio2, On scheduled dilaudid as add on to oxy. Per family request 12/24 - they felt scheduled oxy was not adequate and patient was showing signs of opioid withdrawal.  Patient also had worsening SIRS/sepsis syndrome. Had fever, rising wbc, worsening RR 40-60 and HR 140s sinus->started On abx yesterday. Fever some better today. WBC plateau at 28,.5K. On new levophed ->signifanct improvement in HR 77 and RR t0 20. On heparin gtt. On precedex gtt. On levophed gtt 6mcg wthi midodrine. On TF 12/30 Remains on CRRT with intermittent pressor requirements. Ethics consult placed 12/29. Ethics rec time trial of CRRT 12/31 failed SBT with RR 40s. Several conversations between care tam and family, who are upset RE plan of care 1/1 back on pressors  1/4: On pressors, keeping even on CVVHD, HGB drop to 6.9, transfused 1 unit. Improving mental  status 1/10 remains on low dose levophed 1mcg 1/11 off levo, attempting HD with UF for first time. Now tolerating intermittent HD. 1/19 patient transferred to progressive bed with tracheostomy on 35% FiO2.  has NG tube feeding.  No PEG tube.  Received dialysis on 1/18. 1/24 significant improvement and persistent tachycardia after introduction of twice daily beta-blocker.  Heart rates have improved from the 130s to the 98-104 range 1/24 core track clogged and therefore has been removed by nutrition team.  Calorie count in progress and we are weaning any sedating medications which could be contributing to patient's inability to eat. 1/27 continue w/ cuff #6 trach   Subjective: Patient reporting no improvement and right lower extremity leg pain after introduction of Crea muscle rub.  Continues to have significant buttock pain in context of large sacral decubitus.  Discussed with patient option of increasing OxyIR dose versus adding low-dose Neurontin twice daily as adjunct of treatment.  Will need to speak with daughter before initiating med changes.    Objective: Vitals:   12/06/20 0356 12/06/20 0500  BP: 101/62   Pulse:    Resp:  (!) 21  Temp: 98.5 F (36.9 C)   SpO2:      Intake/Output Summary (Last 24 hours) at 12/06/2020 0744 Last data filed at 12/05/2020 1000 Gross per 24 hour  Intake 237 ml  Output --  Net 237 ml   Filed Weights    Exam: Constitutional: Alert, calm, no acute distress except for ongoing buttock pain Respiratory: Lungs clear, talking caused patient to cough FiO2 21%. #4 cuffed trach with PMV in place Cardiovascular: Normal heart sounds, extremities warm to touch with adequate capillary refill, pulse regular Abdomen: On regular diet with nectar thick liquids- LBM 2/2-core track tube with tube feedings on hold colostomy unremarkable in appearance.  Abdomen soft nontender nondistended.  There is to be eating more consistently and in higher volumes Skin:  Massive  sacral decubitus ulcer-wound VAC now in place Neurologic: CN 2-12 grossly intact. Sensation intact, DTR normal. Strength 1/5 x all 4 extremities.  Psychiatric: Awake, somewhat flat affect, oriented x3   Assessment/Plan: Acute problems: PAF maintaining sinus rhythm w/persistent tachycardia -Continue metoprolol 80 mg twice daily.  Will discontinue as needed IV metoprolol for now -Cardiology has seen  -Echocardiogram this admission with an EF 50 to 55% with mild diastolic dysfunction parameters and normal RV systolic function -Continue Eliquis-benefits coordinator has determined that Eliquis is covered at [facility] [place] with a co-pay of $47  Acute respiratory failure secondary to COVID-pneumonia/tracheostomy/acute MSSA tracheobronchitis -Patient stable on low-flow oxygen FiO2 21% -PCCM/trach team following-last evaluated on 1/31 -Repeat respiratory culture on 1/24 with abundant staph with sensitivities pending -Continue IV Ancef for MSSA tracheobronchitis -Continue  suctioning and chest PT-Vibra vest -Continue PMV training   Acute kidney injury secondary to COVID-related sepsis with shock superimposed on stage III chronic kidney disease -Continue intermittent hemodialysis as directed by the renal team -Avoid nephrotoxic medications -Continue Aranesp on dialysis days -Patient is voiding and in the past 24 hours had 300 cc of urine out -BP improved therefore will change midodrine to use on dialysis days only 3 times daily  Lower extremity muscle pain -Begin Crea topically -Continue PROM and AROM -Consider increasing OxyIR versus adding adjunct of low-dose Neurontin  Dysphagia/moderate to severe protein calorie malnutrition Nutrition Status: Nutrition Problem: Increased nutrient needs Etiology: wound healing Signs/Symptoms: estimated needs Interventions: Refer to RD note for recommendations  Estimated body mass index is 31.04 kg/m as calculated from the following:   Height as of  this encounter: $RemoveBeforeD'5\' 8"'[id number]$  (1.727 m).   Weight as of this encounter: 92.6 kg.  -2/3: Continue regular diet to carb modified nectar thick liquids given underlying diabetes diagnosis -Contacted by nutrition team/registered dietitian.  Concerned that oral intake remains in adequate to aid in appropriate wound healing.  Requested that we resume nocturnal tube feedings.  Order given. -Continue Megace  Acute hemorrhagic shock secondary to perforated duodenal ulcer/anemia of critical illness -Stable postoperatively -Has tolerated tube feedings and oral diet without any evidence of postoperative obstructive issues which can be common after duodenal oversewing with subsequent scar tissue formation -Hemoglobin remains stable greater than 8.0  Diabetes mellitus 2 -Patient has been transition to regular diet along with tube feedings and we have noted significant increase in CBG readings -We will trial holding nocturnal tube feedings and allow oral diet only -Continue very sensitive SSI with meals -HgbA1c 08/24/2020 and was 7.0 -Prior to admission patient was on metformin XR-unable to use at this juncture due to severity of kidney disease -we are hopeful for recovery of renal function  Profound physical deconditioning/acute encephalopathy secondary to prolonged hospital stay/orthostasis -SNF recommended but family wants to take patient home -At this juncture patient unable to sit up in bed independently much less stand pivot and rotate to get into wheelchair and would be significant fall  risk and care risk at home -In addition patient would need to be able to get in wheelchair to transport to dialysis if dialysis were to be continued in the outpatient setting -family refuses LTAC or SNF rehab due to pt prior request to NEVER be placed in any type of facility -Continue high-dose midodrine 3 times daily -Persistent tachycardia secondary to severe deconditioning has improved with the introduction of  beta-blockers -Continue PROM every 4 hours-patient encouraged to perform independent AROM as well -2/3 order placed for repositioning every 2 hours -2/2: Successful out of bed to chair with initial short-term plan for 45 minutes to 1 hour today -FAMILY THAT WILL PROVIDE CARE FOR PT WILL NEED TO ASSIST WITH CARE OF THIS PT AT [place]  Acute encephalopathy with ICU delirium -Delirium has resolved and supportive meds of Klonopin and Seroquel have been weaned and discontinued  Stage IV sacral decubitus -Not present on admission -Wound care nurse following and utilizing Santyl and hydrotherapy and now has wound VAC in place -Surgical team consulted.  Current recommendation is against pursuing general anesthesia to undergo extensive surgical procedure-hopefully can be reevaluated in the future if wound does not heal adequately.  As of 1/28 they have nothing further acutely to add but will be following peripherally. [facility] rotational bed  -Continue low-dose oxycodone for pain patient complaining of inadequate pain control at sacral decubitus.  Consider increasing oxycodone dose versus adding low-dose Neurontin as adjunctive therapy. -Currently bedside to answer medication questions that daughter had.  Patient's son also on speaker phone.  Great length regarding pharmacology of narcotic pain medications at this juncture daughter is agreeable to continuing OxyIR including request to have this medication given at least 1 to 1-1/2 hours prior to wound care.  She is also agreeable to utilizing as needed IV morphine with wound care only if oral premed with OxyIR not adequate.  I have updated patient's bedside LPN and I will also update physical therapy team.  -    11/24/2020 after [facility]      11/22/2020                       11/29/2020  Incision (Closed) 09/01/20 Abdomen (Active)  Date First Assessed/Time First Assessed: 09/01/20 1737   Location: Abdomen    Assessments 09/01/2020  6:25 PM  12/03/2020  4:00 AM  Dressing Type Gauze (Comment) Abdominal pads;Gauze (Comment)  Dressing Change Frequency -- Daily  Site / Wound Assessment Dressing in place / Unable to assess Clean;Dry  Drainage Amount None --     No Linked orders to display     Pressure Injury 09/17/20 Ear Left;Anterior;Posterior Stage 2 -  Partial thickness loss of dermis presenting as a shallow open injury with a red, pink wound bed without slough. (Active)  Date First Assessed/Time First Assessed: 09/17/20 0800   Location: Ear  Location Orientation: Left;Anterior;Posterior  Staging: Stage 2 -  Partial thickness loss of dermis presenting as a shallow open injury with a red, pink wound bed without slough. ...    Assessments 09/17/2020  8:00 AM 12/05/2020  8:00 PM  Dressing Type Foam - Lift dressing to assess site every shift None  Dressing Clean;Dry;Intact --  Dressing Change Frequency Every 3 days --  Site / Wound Assessment Dry;Pink --  Peri-wound Assessment Intact --  Wound Length (cm) 2 cm --  Wound Width (cm) 1 cm --  Wound Depth (cm) 0.25 cm --  Wound Surface Area (cm^2) 2  cm^2 --  Wound Volume (cm^3) 0.5 cm^3 --  Margins Unattached edges (unapproximated) --  Drainage Amount Scant --  Drainage Description Serosanguineous --  Treatment Cleansed --     No Linked orders to display     Pressure Injury 09/25/20 Sacrum Bilateral;Medial Deep Tissue Pressure Injury - Purple or maroon localized area of discolored intact skin or blood-filled blister due to damage of underlying soft tissue from pressure and/or shear. Purple, non-blanchable, b (Active)  Date First Assessed/Time First Assessed: 09/25/20 2000   Location: Sacrum  Location Orientation: Bilateral;Medial  Staging: Deep Tissue Pressure Injury - Purple or maroon localized area of discolored intact skin or blood-filled blister due to damage o...    Assessments 09/25/2020  5:00 PM 12/05/2020  8:00 PM  Dressing Type Foam - Lift dressing to assess site every shift --   Dressing Changed;Clean;Dry;Intact --  Site / Wound Assessment -- Dressing in place / Unable to assess     No Linked orders to display     Negative Pressure Wound Therapy Sacrum (Active)  Placement Date/Time: 11/22/20 1500   Wound Type: (c) Other (Comment)  Location: Sacrum    Assessments 11/22/2020  4:59 PM 12/04/2020  1:26 PM  Last dressing change 11/22/20 --  Site / Wound Assessment Granulation tissue;Pale;Yellow --  Peri-wound Assessment Intact --  Size see above --  Wound filler - Black foam 2 --  Wound filler - White foam 0 --  Wound filler - Nonadherent 0 --  Wound filler - Gauze 0 --  Cycle Continuous --  Target Pressure (mmHg) 125 --  Instillation Volume 26 mL --  Instillation Solution Normal Saline --  Instillation Soak Time 10 minutes --  Instillation Therapy Time 3.5 hours --  Canister Changed No --  Dressing Status Intact --  Drainage Amount None --  Output (mL) -- 100 mL     No Linked orders to display     Other problems: History of stage IV colon cancer -Old colostomy  Acute encephalopathy with ICU delirium -Delirium has resolved and supportive meds of Klonopin and Seroquel have been weaned and discontinued  Hypomagnesemia -Continue oral magnesium -1/21 give 4 g magnesium IV x1 -Follow labs  DVT bilateral posterior tibial veins -Was not present during initial COVID admission -Continue Eliquis for now given severe debility and increased risk for developing recurrent DVT  Hypertension -Having issues with orthostasis requiring midodrine Prior to admission patient was on Norvasc  Dyslipidemia -Prior to admission patient was on Crestor-consider resuming soon once patient can swallow pills whole  Abnormal TSH -Nov 2021 TSH was 0.015-TSH and free T4, T3 normal this admission  Data Reviewed: Basic Metabolic Panel: Recent Labs  Lab 12/02/20 0226 12/03/20 0312 12/04/20 0111 12/05/20 0610 12/06/20 0117  NA 132* 137 137 138 136  K 3.7 3.5 3.3* 3.2*  3.4*  CL 95* 100 107 105 107  CO2 23 24 19* 22 19*  GLUCOSE 339* 194* 198* 131* 181*  BUN 66* 75* 73* 35* 35*  CREATININE 1.44* 1.35* 1.24* 0.95 1.03*  CALCIUM 10.1 10.2 9.5 9.1 9.5  PHOS 4.3 4.5 4.3 3.7 3.7   Liver Function Tests: Recent Labs  Lab 12/02/20 0226 12/03/20 0312 12/04/20 0111 12/05/20 0610 12/06/20 0117  ALBUMIN 2.2* 2.1* 2.1* 2.1* 2.1*   No results for input(s): LIPASE, AMYLASE in the last 168 hours. No results for input(s): AMMONIA in the last 168 hours. CBC: Recent Labs  Lab 11/30/20 1248 12/04/20 0819 12/06/20 0117  WBC 16.4* 16.6* 13.7*  NEUTROABS  --   --  9.9*  HGB 8.3* 7.5* 7.7*  HCT 27.2* 24.2* 26.2*  MCV 85.5 84.9 87.6  PLT 369 417* 355   Cardiac Enzymes: No results for input(s): CKTOTAL, CKMB, CKMBINDEX, TROPONINI in the last 168 hours. BNP (last 3 results) Recent Labs    09/07/20 0118 09/08/20 0446 09/09/20 0428  BNP 216.8* 432.5* 609.7*    ProBNP (last 3 results) No results for input(s): PROBNP in the last 8760 hours.  CBG: Recent Labs  Lab 12/05/20 1222 12/05/20 1554 12/05/20 2025 12/05/20 2317 12/06/20 0358  GLUCAP 173* 197* 182* 226* 137*    No results found for this or any previous visit (from the past 240 hour(s)).   Studies: No results found.  Scheduled Meds: . apixaban  2.5 mg Oral BID  . chlorhexidine gluconate (MEDLINE KIT)  15 mL Mouth Rinse BID  . Chlorhexidine Gluconate Cloth  6 each Topical Daily  . cholecalciferol  2,000 Units Oral Daily  . clonazepam  0.25 mg Oral QHS  . collagenase   Topical BID  . darbepoetin (ARANESP) injection - DIALYSIS  100 mcg Intravenous Q Sat-HD  . feeding supplement (NEPRO CARB STEADY)  237 mL Oral TID BM  . feeding supplement (PROSource TF)  45 mL Per Tube TID  . guaiFENesin  15 mL Oral Q12H  . insulin aspart  0-6 Units Subcutaneous TID WC  . mouth rinse  15 mL Mouth Rinse QID  . megestrol  200 mg Oral QID  . metoprolol tartrate  50 mg Oral BID  . midodrine  40 mg Oral  3 times per day on Mon Wed Fri  . multivitamin  1 tablet Oral QHS  . pantoprazole sodium  40 mg Oral BID  . sodium chloride flush  10-40 mL Intracatheter Q12H   Continuous Infusions: . albumin human 25 g (11/16/20 1516)  . ferric gluconate (FERRLECIT/NULECIT) IV 125 mg (12/05/20 0930)    Principal Problem:   Atrial fibrillation with RVR ([place]) Active Problems:   Acute respiratory failure due to COVID-19 [facility])   New onset a-fib (HCC)   Leukocytosis   Atrial fibrillation with rapid ventricular response (HCC)   Hypoxia   Pressure injury of skin   Acute hypoxemic respiratory failure (HCC)   ARDS (adult respiratory distress syndrome) (HCC)   Perforated duodenal ulcer ([place])   On mechanically assisted ventilation ([place])   Palliative care by specialist   Goals of care, counseling/discussion   Shock ([place])   Acute and chronic respiratory failure (acute-on-chronic) ([place])   Status post tracheostomy ([place])   ESRD (end stage renal disease) ([place])   HAP (hospital-acquired pneumonia)   Consultants:  Cardiology  Surgery  Nephrology  Ethics  PCCM   Procedures: R PICC 11/5 >> A line 11/9 >> out ETT 11/9 > 11/16, 11/16 >> 09/21/2020, 09/21/2020 tracheostomy>> Lt [place] CVL 11/9 >> R IJ trialysis >> out HD catheter 12/1 >>12/20 12/21 - 14.5 Fr, 23 cm right IJ tunneled hemodialysis catheter placement. Removal of indwelling subclavian catheter.   Antibiotics: Anti-infectives (From admission, onward)   Start     Dose/Rate Route Frequency Ordered Stop   11/27/20 2000  ceFAZolin (ANCEF) IVPB 1 g/50 mL premix        1 g 100 mL/hr over 30 Minutes Intravenous Every 24 hours 11/26/20 1248 12/03/20 2136   11/26/20 1345  ceFAZolin (ANCEF) IVPB 1 g/50 mL premix        1 g 100 mL/hr over 30 Minutes Intravenous  Once 11/26/20  1248 11/26/20 1433   11/17/20 1415  fluconazole (DIFLUCAN) 40 MG/ML suspension 200 mg        200 mg Oral  Once 11/17/20 1315 11/17/20 1357   11/03/20 2200  meropenem  (MERREM) 500 mg in sodium chloride 0.9 % 100 mL IVPB        500 mg 200 mL/hr over 30 Minutes Intravenous Every 24 hours 11/03/20 1500 11/06/20 2217   11/01/20 1000  anidulafungin (ERAXIS) 100 mg in sodium chloride 0.9 % 100 mL IVPB  Status:  Discontinued       "Followed by" Linked Group Details   100 mg 78 mL/hr over 100 Minutes Intravenous Every 24 hours 10/31/20 0916 11/01/20 1404   10/31/20 1015  meropenem (MERREM) 1 g in sodium chloride 0.9 % 100 mL IVPB  Status:  Discontinued        1 g 200 mL/hr over 30 Minutes Intravenous Every 8 hours 10/31/20 0916 11/03/20 1500   10/31/20 1015  linezolid (ZYVOX) IVPB 600 mg  Status:  Discontinued        600 mg 300 mL/hr over 60 Minutes Intravenous Every 12 hours 10/31/20 0916 11/02/20 0906   10/31/20 1015  anidulafungin (ERAXIS) 200 mg in sodium chloride 0.9 % 200 mL IVPB       "Followed by" Linked Group Details   200 mg 78 mL/hr over 200 Minutes Intravenous  Once 10/31/20 0916 10/31/20 1630   10/25/20 1800  ceFAZolin (ANCEF) IVPB 2g/100 mL premix  Status:  Discontinued        2 g 200 mL/hr over 30 Minutes Intravenous Every 12 hours 10/25/20 1022 10/30/20 1103   10/23/20 2000  vancomycin (VANCOREADY) IVPB 750 mg/150 mL  Status:  Discontinued        750 mg 150 mL/hr over 60 Minutes Intravenous Every 24 hours 10/22/20 2036 10/25/20 1022   10/23/20 1800  piperacillin-tazobactam (ZOSYN) IVPB 3.375 g  Status:  Discontinued        3.375 g 100 mL/hr over 30 Minutes Intravenous Every 6 hours 10/23/20 1155 10/24/20 1422   10/23/20 0200  piperacillin-tazobactam (ZOSYN) IVPB 3.375 g  Status:  Discontinued        3.375 g 100 mL/hr over 30 Minutes Intravenous Every 8 hours 10/22/20 2036 10/23/20 1155   10/22/20 1630  vancomycin (VANCOREADY) IVPB 1500 mg/300 mL        1,500 mg 150 mL/hr over 120 Minutes Intravenous  Once 10/22/20 1537 10/22/20 2229   10/22/20 1630  piperacillin-tazobactam (ZOSYN) IVPB 2.25 g  Status:  Discontinued        2.25 g 100 mL/hr  over 30 Minutes Intravenous Every 8 hours 10/22/20 1537 10/22/20 2036   10/22/20 1537  vancomycin variable dose per unstable renal function (pharmacist dosing)  Status:  Discontinued         Does not apply See admin instructions 10/22/20 1537 10/22/20 2036   10/19/20 0600  ceFAZolin (ANCEF) IVPB 2g/100 mL premix        2 g 200 mL/hr over 30 Minutes Intravenous To Radiology 10/18/20 1457 10/19/20 0938   10/10/20 0830  vancomycin (VANCOCIN) IVPB 1000 mg/200 mL premix       "Followed by" Linked Group Details   1,000 mg 200 mL/hr over 60 Minutes Intravenous Every 24 hours 10/09/20 0744 10/15/20 0902   10/09/20 0830  piperacillin-tazobactam (ZOSYN) IVPB 3.375 g        3.375 g 100 mL/hr over 30 Minutes Intravenous Every 6 hours 10/09/20 0744 10/16/20 0038  10/09/20 0830  vancomycin (VANCOREADY) IVPB 2000 mg/400 mL       "Followed by" Linked Group Details   2,000 mg 200 mL/hr over 120 Minutes Intravenous  Once 10/09/20 0744 10/09/20 1022   09/24/20 1000  ceFAZolin (ANCEF) IVPB 2g/100 mL premix  Status:  Discontinued        2 g 200 mL/hr over 30 Minutes Intravenous Every 12 hours 09/24/20 0801 09/24/20 1046   09/23/20 1400  vancomycin (VANCOCIN) IVPB 1000 mg/200 mL premix        1,000 mg 200 mL/hr over 60 Minutes Intravenous Every 24 hours 09/22/20 1436 09/28/20 1718   09/22/20 2200  ceFEPIme (MAXIPIME) 2 g in sodium chloride 0.9 % 100 mL IVPB  Status:  Discontinued        2 g 200 mL/hr over 30 Minutes Intravenous Every 12 hours 09/22/20 1436 09/24/20 0801   09/22/20 1030  ceFEPIme (MAXIPIME) 1 g in sodium chloride 0.9 % 100 mL IVPB        1 g 200 mL/hr over 30 Minutes Intravenous  Once 09/22/20 0934 09/22/20 1145   09/22/20 1015  vancomycin (VANCOCIN) IVPB 1000 mg/200 mL premix        1,000 mg 200 mL/hr over 60 Minutes Intravenous  Once 09/22/20 0934 09/22/20 1446   09/12/20 2200  ceFEPIme (MAXIPIME) 2 g in sodium chloride 0.9 % 100 mL IVPB        2 g 200 mL/hr over 30 Minutes Intravenous  Every 12 hours 09/12/20 0732 09/14/20 2134   09/11/20 1400  ceFEPIme (MAXIPIME) 2 g in sodium chloride 0.9 % 100 mL IVPB  Status:  Discontinued        2 g 200 mL/hr over 30 Minutes Intravenous Every 8 hours 09/11/20 1126 09/12/20 0732   09/08/20 1000  vancomycin (VANCOREADY) IVPB 2000 mg/400 mL        2,000 mg 200 mL/hr over 120 Minutes Intravenous  Once 09/08/20 0857 09/08/20 1224   09/08/20 1000  ceFEPIme (MAXIPIME) 2 g in sodium chloride 0.9 % 100 mL IVPB  Status:  Discontinued        2 g 200 mL/hr over 30 Minutes Intravenous Every 12 hours 09/08/20 0857 09/11/20 1126   09/08/20 0856  vancomycin variable dose per unstable renal function (pharmacist dosing)  Status:  Discontinued         Does not apply See admin instructions 09/08/20 0857 09/09/20 0935   09/02/20 1600  cefTRIAXone (ROCEPHIN) 1 g in sodium chloride 0.9 % 100 mL IVPB  Status:  Discontinued        1 g 200 mL/hr over 30 Minutes Intravenous Every 24 hours 09/01/20 1811 09/02/20 0838   09/01/20 1800  fluconazole (DIFLUCAN) IVPB 400 mg        400 mg 50 mL/hr over 240 Minutes Intravenous  Once 09/01/20 1749 09/02/20 0603   09/01/20 1530  piperacillin-tazobactam (ZOSYN) IVPB 3.375 g        3.375 g 12.5 mL/hr over 240 Minutes Intravenous Every 8 hours 09/01/20 1514 09/05/20 2111   09/01/20 1000  levofloxacin (LEVAQUIN) tablet 250 mg  Status:  Discontinued        250 mg Oral Daily 08/31/20 1508 08/31/20 1735   08/31/20 1730  cefTRIAXone (ROCEPHIN) 1 g in sodium chloride 0.9 % 100 mL IVPB  Status:  Discontinued        1 g 200 mL/hr over 30 Minutes Intravenous Every 24 hours 08/31/20 1726 09/01/20 1513   08/31/20 1730  azithromycin (ZITHROMAX)  500 mg in sodium chloride 0.9 % 250 mL IVPB  Status:  Discontinued        500 mg 250 mL/hr over 60 Minutes Intravenous Every 24 hours 08/31/20 1726 09/02/20 0838       Time spent: 30 minutes    [name] ANP  [facility] 7 am - 330 pm/M-F for direct patient care and  secure chat Please refer to Amion for contact info 96  days

## 2020-12-06 NOTE — Progress Notes (Signed)
[facility] NEPHROLOGY PROGRESS NOTE  Assessment/ Plan:  # End-stage renal disease from prolonged dialysis dependent acute kidney Injury: Suspected to be multifactorial acute kidney injury from septic shock/MRSA pneumonia and hemorrhagic shock at the outside.  Has been on renal replacement therapy since 09/08/2020 and recently able to transition to intermittent hemodialysis from CRRT.  Intermittent hemodialysis has been challenging due to hypotension and positional discomfort from decubitus ulcer.  Creatinine remains misleadingly low and is indicative of significant lean muscle mass loss with prolonged hospitalization; urine output incompletely charted.  She is not a candidate for CIR and family have no desire to have her admitted to SNF or LTAC and intend to take her home upon discharge.  Continue TTS dialysis while here; next treatment due 2/8. Stable today.   # History of Covid pneumonia with ARDS: Status post tracheostomy and respiratory status appears to be stable with supplementation via T-bar.  # Atrial fibrillation with rapid ventricular response: on metoprolol, tachycardic.  On anticoagulation with Eliquis.  # Severe protein calorie malnutrition with large sacral decubitus ulcer: Ongoing nutritional supplementation with wound care.  # CKD-MBD: Calcium and phosphorus level acceptable.  Continue monitor.  # Anemia: Complicated by GI bleed from perforated duodenal ulcer status post exploratory laparotomy with Phillip Heal patch placement.    Receiving IV iron and ESA.  Monitor hemoglobin.  Subjective: Seen and examined.  No new event.  She is alert awake and following commands. Objective Vital signs in last 24 hours: Vitals:   12/06/20 0356 12/06/20 0500 12/06/20 0745 12/06/20 0810  BP: 101/62   120/69  Pulse:   (!) 125   Resp:  (!) 21  (!) 28  Temp: 98.5 F (36.9 C)   98.3 F (36.8 C)  TempSrc: Oral   Oral  SpO2:   96%   Height:       Weight change:   Intake/Output  Summary (Last 24 hours) at 12/06/2020 0834 Last data filed at 12/05/2020 1000 Gross per 24 hour  Intake 237 ml  Output --  Net 237 ml       Labs: Basic Metabolic Panel: Recent Labs  Lab 12/04/20 0111 12/05/20 0610 12/06/20 0117  NA 137 138 136  K 3.3* 3.2* 3.4*  CL 107 105 107  CO2 19* 22 19*  GLUCOSE 198* 131* 181*  BUN 73* 35* 35*  CREATININE 1.24* 0.95 1.03*  CALCIUM 9.5 9.1 9.5  PHOS 4.3 3.7 3.7   Liver Function Tests: Recent Labs  Lab 12/04/20 0111 12/05/20 0610 12/06/20 0117  ALBUMIN 2.1* 2.1* 2.1*   No results for input(s): LIPASE, AMYLASE in the last 168 hours. No results for input(s): AMMONIA in the last 168 hours. CBC: Recent Labs  Lab 11/30/20 1248 12/04/20 0819 12/06/20 0117  WBC 16.4* 16.6* 13.7*  NEUTROABS  --   --  9.9*  HGB 8.3* 7.5* 7.7*  HCT 27.2* 24.2* 26.2*  MCV 85.5 84.9 87.6  PLT 369 417* 355   Cardiac Enzymes: No results for input(s): CKTOTAL, CKMB, CKMBINDEX, TROPONINI in the last 168 hours. CBG: Recent Labs  Lab 12/05/20 1554 12/05/20 2025 12/05/20 2317 12/06/20 0358 12/06/20 0809  GLUCAP 197* 182* 226* 137* 115*    Iron Studies: No results for input(s): IRON, TIBC, TRANSFERRIN, FERRITIN in the last 72 hours. Studies/Results: No results found.  Medications: Infusions: . albumin human 25 g (11/16/20 1516)  . ferric gluconate (FERRLECIT/NULECIT) IV 125 mg (12/06/20 2010)    Scheduled Medications: . apixaban  2.5 mg Oral BID  .  chlorhexidine gluconate (MEDLINE KIT)  15 mL Mouth Rinse BID  . Chlorhexidine Gluconate Cloth  6 each Topical Daily  . cholecalciferol  2,000 Units Oral Daily  . clonazepam  0.25 mg Oral QHS  . collagenase   Topical BID  . darbepoetin (ARANESP) injection - DIALYSIS  100 mcg Intravenous Q Sat-HD  . feeding supplement (NEPRO CARB STEADY)  237 mL Oral TID BM  . feeding supplement (PROSource TF)  45 mL Per Tube TID  . guaiFENesin  15 mL Oral Q12H  . insulin aspart  0-6 Units Subcutaneous TID WC   . mouth rinse  15 mL Mouth Rinse QID  . megestrol  200 mg Oral QID  . metoprolol tartrate  50 mg Oral BID  . midodrine  40 mg Oral 3 times per day on Mon Wed Fri  . multivitamin  1 tablet Oral QHS  . pantoprazole sodium  40 mg Oral BID  . sodium chloride flush  10-40 mL Intracatheter Q12H    have reviewed scheduled and prn medications.  Physical Exam: General:NAD, comfortable, tracheostomy Heart:RRR, s1s2 nl Lungs:clear b/l, no crackle Abdomen:soft, Non-tender, non-distended Extremities: Trace dependent edema. Dialysis Access: Right IJ TDC in place.  Laurier Jasperson Prasad Alexas Basulto 12/06/2020,8:34 AM  LOS: 96 days

## 2020-12-06 NOTE — Progress Notes (Signed)
Pharmacy Antibiotic Note  Candace [name] is a 70 y.o. female admitted on 08/31/2020 with pneumonia.  Pharmacy has been consulted for vancomycin dosing after blood cultures came back growing staph epidermidis (mec A resistance detected). Patient recently completed course of ancef for MSSA.  Tmax of 99.1, wbc slightly elevated at 13.7. Currently on iHD on TTS.   Plan: Vancomycin 2g IV now then 1g after each HD session TTS Consider rechecking cultures  Height: 5\' 8"  (373.5 cm) Weight:  (Unable to check bed) IBW/kg (Calculated) : 63.9  Temp (24hrs), Avg:98.5 F (36.9 C), Min:98.3 F (36.8 C), Max:98.7 F (37.1 C)  Recent Labs  Lab 11/30/20 1248 12/01/20 0844 12/02/20 0226 12/03/20 0312 12/04/20 0111 12/04/20 0819 12/05/20 0610 12/06/20 0117  WBC 16.4*  --   --   --   --  16.6*  --  13.7*  CREATININE 1.70*   < > 1.44* 1.35* 1.24*  --  0.95 1.03*   < > = values in this interval not displayed.    Estimated Creatinine Clearance: 61.4 mL/min (A) (by C-G formula based on SCr of 1.03 mg/dL (H)).    No Known Allergies  Antimicrobials: Cefazolin 1/28 >> 2/4   Thank you for allowing [place] to participate in this patients care.   [name] PharmD., BCPS Clinical Pharmacist 12/06/2020 9:52 PM

## 2020-12-06 NOTE — Progress Notes (Addendum)
Calorie Count Note  48 hour calorie count ordered.  Diet: Regular, Nectar Thick Supplements: Nepro TID (each supplement provides 425 kcal and 19 grams protein). Magic cup TID  (each cup provides 290 kcal and 9 grams of protein)  Attempted to speak with pt and daughter x 3, however, unavailable at times of visit (participating in hydrotherapy or therapy at times of attempted visits). Calorie count data continues to be minimal (only one ticket available since 12/03/20) and minimal meal completion data documented in flowsheets.   Case discussed with [name], NP, regarding re-starting nocturnal TF. NP agreeable to re-start nocturnal TF. Suspect that pt will require permanent feeding access (ex PEG) to assist with wound healing in light of continued inadequate oral intake. Pt with improved oral intake, but only meeting <60% of needs.   2/3 Breakfast: not available Lunch: 100% greek yogurt, 50% Magic Cup (230 kcals, 17 g of protein) Dinner: 1/3 of [employer], Nectar Cranberry Juice (350 kcals, 10 g protein) HS snack: 100% cereal with milk (250 kcals, 8 g of protein) Supplements: At least 1 Nepro (100%)- 425 kcals, 19 g of protein  Total intake: 1255 kcal (63% of minimum estimated needs)  44 grams protein (44% of minimum estimated needs)  2/4 Breakfast: 492 kcal, 22 grams Supplements: 2 additional Nepro Shakes  Total intake: 1342 kcal (67% of minimum estimated needs)  43 grams protein (43% of minimum estimated needs)  2/5 Breakfast: nothing documented Lunch: nothing documented Dinner: nothing documented Supplements: 2 Nepro shakes  Total intake: 850 kcal (43% of minimum estimated needs)  38 grams protein (38% of minimum estimated needs)  2/6 Breakfast: nothing documented Lunch: nothing documented Dinner: nothing documented Supplements: 3 Nepro shakes  Total intake: 1275 kcal (64% of minimum estimated needs)  57 grams protein (57% of minimum estimated  needs)  AverageTotal intake: 1181 kcal (59% of minimum estimated needs)  46 grams protein (46% of minimum estimated needs)  Nutrition Dx:  Increased nutrient needs related to wound healing as evidenced by estimated needs; Ongoing  Goal: Patient will meet greater than or equal to 90% of their needs; improving, but still unmet  Intervention:    Continue Nepro Shake po TID, each supplement provides 425 kcal and 19 grams protein  Continue Magic cup TID with meals, each supplement provides 290 kcal and 9 grams of protein  Cater to pt preferences; daughter ordering meals for patient. Pt enjoys the tilapia and chicken salad. Pt also wanting to alternate between vanilla and strawberry/blueberry greek yogurt  Continue liberalized diet (no restrictions other than the nectar liquids); allow double portion of protein at meals  Continue Renal MVI  Continue appetite stimulant (currently on Megace)  Resume nocturnal TF:   Nepro @ 65 ml/hr x 12 hours via cortrak tube  45 ml Prosource TF TID  Regimen provides 1524 kcals, 96 grams protein, and 570 ml free water daily, meeting 76% of estimated kcal needs and 96% of estimated protein needs.   -Continue 1 packet Juven BID via cortrak tube, each packet provides 95 calories, 2.5 grams of protein (collagen), and 9.8 grams of carbohydrate (3 grams sugar); also contains 7 grams of L-arginine and L-glutamine, 300 mg vitamin C, 15 mg vitamin E, 1.2 mcg vitamin B-12, 9.5 mg zinc, 200 mg calcium, and 1.5 g  Calcium Beta-hydroxy-Beta-methylbutyrate to support wound healing   Loistine Chance, RD, LDN, [place] Registered Dietitian II Certified Diabetes Care and Education Specialist Please refer to AMION for RD and/or RD on-call/weekend/after  hours pager

## 2020-12-06 NOTE — Progress Notes (Signed)
PHARMACY - PHYSICIAN COMMUNICATION CRITICAL VALUE ALERT - BLOOD CULTURE IDENTIFICATION (BCID)  Candace [name] is an 70 y.o. female who presented to [facility] on 08/31/2020 with a chief complaint of weakness.   Assessment:  Patient s/p treatment for MSSA pneumonia, currently with sacral wound. Tmax 99.1, wbc 13.7. Now with blood culture positive for methicillin resistant staph epidermidis in blood culture.   Name of physician (or Provider) Contacted: [place]  Current antibiotics: none  Changes to prescribed antibiotics recommended:  Recommendations accepted by provider, Will start vancomycin to cover for now.   Results for orders placed or performed during the hospital encounter of 08/31/20  Blood Culture ID Panel (Reflexed) (Collected: 12/05/2020 11:07 AM)  Result Value Ref Range   Enterococcus faecalis NOT DETECTED NOT DETECTED   Enterococcus Faecium NOT DETECTED NOT DETECTED   Listeria monocytogenes NOT DETECTED NOT DETECTED   Staphylococcus species DETECTED (A) NOT DETECTED   Staphylococcus aureus (BCID) NOT DETECTED NOT DETECTED   Staphylococcus epidermidis DETECTED (A) NOT DETECTED   Staphylococcus lugdunensis NOT DETECTED NOT DETECTED   Streptococcus species NOT DETECTED NOT DETECTED   Streptococcus agalactiae NOT DETECTED NOT DETECTED   Streptococcus pneumoniae NOT DETECTED NOT DETECTED   Streptococcus pyogenes NOT DETECTED NOT DETECTED   A.calcoaceticus-baumannii NOT DETECTED NOT DETECTED   Bacteroides fragilis NOT DETECTED NOT DETECTED   Enterobacterales NOT DETECTED NOT DETECTED   Enterobacter cloacae complex NOT DETECTED NOT DETECTED   Escherichia coli NOT DETECTED NOT DETECTED   Klebsiella aerogenes NOT DETECTED NOT DETECTED   Klebsiella oxytoca NOT DETECTED NOT DETECTED   Klebsiella pneumoniae NOT DETECTED NOT DETECTED   Proteus species NOT DETECTED NOT DETECTED   Salmonella species NOT DETECTED NOT DETECTED   Serratia marcescens NOT DETECTED NOT DETECTED    Haemophilus influenzae NOT DETECTED NOT DETECTED   Neisseria meningitidis NOT DETECTED NOT DETECTED   Pseudomonas aeruginosa NOT DETECTED NOT DETECTED   Stenotrophomonas maltophilia NOT DETECTED NOT DETECTED   Candida albicans NOT DETECTED NOT DETECTED   Candida auris NOT DETECTED NOT DETECTED   Candida glabrata NOT DETECTED NOT DETECTED   Candida krusei NOT DETECTED NOT DETECTED   Candida parapsilosis NOT DETECTED NOT DETECTED   Candida tropicalis NOT DETECTED NOT DETECTED   Cryptococcus neoformans/gattii NOT DETECTED NOT DETECTED   Methicillin resistance mecA/C DETECTED (A) NOT DETECTED   [name] PharmD., BCPS Clinical Pharmacist 12/06/2020 8:15 PM

## 2020-12-07 DIAGNOSIS — Z930 Tracheostomy status: Secondary | ICD-10-CM | POA: Diagnosis not present

## 2020-12-07 DIAGNOSIS — I4891 Unspecified atrial fibrillation: Secondary | ICD-10-CM | POA: Diagnosis not present

## 2020-12-07 LAB — CBC WITH DIFFERENTIAL/PLATELET
Abs Immature Granulocytes: 0.14 10*3/uL — ABNORMAL HIGH (ref 0.00–0.07)
Basophils Absolute: 0 10*3/uL (ref 0.0–0.1)
Basophils Relative: 0 %
Eosinophils Absolute: 0.4 10*3/uL (ref 0.0–0.5)
Eosinophils Relative: 3 %
HCT: 25.2 % — ABNORMAL LOW (ref 36.0–46.0)
Hemoglobin: 7.4 g/dL — ABNORMAL LOW (ref 12.0–15.0)
Immature Granulocytes: 1 %
Lymphocytes Relative: 19 %
Lymphs Abs: 3.3 10*3/uL (ref 0.7–4.0)
MCH: 25.5 pg — ABNORMAL LOW (ref 26.0–34.0)
MCHC: 29.4 g/dL — ABNORMAL LOW (ref 30.0–36.0)
MCV: 86.9 fL (ref 80.0–100.0)
Monocytes Absolute: 1.2 10*3/uL — ABNORMAL HIGH (ref 0.1–1.0)
Monocytes Relative: 7 %
Neutro Abs: 12.1 10*3/uL — ABNORMAL HIGH (ref 1.7–7.7)
Neutrophils Relative %: 70 %
Platelets: 395 10*3/uL (ref 150–400)
RBC: 2.9 MIL/uL — ABNORMAL LOW (ref 3.87–5.11)
RDW: 17.2 % — ABNORMAL HIGH (ref 11.5–15.5)
WBC: 17.2 10*3/uL — ABNORMAL HIGH (ref 4.0–10.5)
nRBC: 0.6 % — ABNORMAL HIGH (ref 0.0–0.2)

## 2020-12-07 LAB — RENAL FUNCTION PANEL
Albumin: 2 g/dL — ABNORMAL LOW (ref 3.5–5.0)
Anion gap: 11 (ref 5–15)
BUN: 46 mg/dL — ABNORMAL HIGH (ref 8–23)
CO2: 19 mmol/L — ABNORMAL LOW (ref 22–32)
Calcium: 10 mg/dL (ref 8.9–10.3)
Chloride: 106 mmol/L (ref 98–111)
Creatinine, Ser: 0.9 mg/dL (ref 0.44–1.00)
GFR, Estimated: >60 mL/min (ref >60)
Glucose, Bld: 147 mg/dL — ABNORMAL HIGH (ref 70–99)
Phosphorus: 3.5 mg/dL (ref 2.5–4.6)
Potassium: 3.4 mmol/L — ABNORMAL LOW (ref 3.5–5.1)
Sodium: 136 mmol/L (ref 135–145)

## 2020-12-07 LAB — GLUCOSE, CAPILLARY
Glucose-Capillary: 114 mg/dL — ABNORMAL HIGH (ref 70–99)
Glucose-Capillary: 118 mg/dL — ABNORMAL HIGH (ref 70–99)
Glucose-Capillary: 174 mg/dL — ABNORMAL HIGH (ref 70–99)
Glucose-Capillary: 91 mg/dL (ref 70–99)

## 2020-12-07 LAB — HEPATITIS B SURFACE ANTIGEN: Hepatitis B Surface Ag: NONREACTIVE

## 2020-12-07 MED ORDER — OXYCODONE HCL 5 MG PO TABS
5.0000 mg | ORAL_TABLET | ORAL | Status: DC | PRN
  Administered 2020-12-08 – 2020-12-10 (×3): 10 mg
  Administered 2020-12-10: 5 mg
  Administered 2020-12-10 – 2020-12-11 (×3): 10 mg
  Administered 2020-12-21: 5 mg
  Filled 2020-12-07 (×5): qty 2
  Filled 2020-12-07: qty 1
  Filled 2020-12-07 (×3): qty 2
  Filled 2020-12-07: qty 1
  Filled 2020-12-07 (×2): qty 2

## 2020-12-07 MED ORDER — OXYCODONE HCL 5 MG PO TABS
5.0000 mg | ORAL_TABLET | ORAL | Status: DC | PRN
  Administered 2020-12-07: 10 mg via ORAL
  Administered 2020-12-09: 5 mg via ORAL
  Administered 2020-12-12: 10 mg via ORAL
  Administered 2020-12-12: 5 mg via ORAL
  Administered 2020-12-13: 10 mg via ORAL
  Administered 2020-12-14: 5 mg via ORAL
  Administered 2020-12-14: 10 mg via ORAL
  Administered 2020-12-15: 5 mg via ORAL
  Administered 2020-12-15: 10 mg via ORAL
  Administered 2020-12-15: 5 mg via ORAL
  Administered 2020-12-15: 10 mg via ORAL
  Administered 2020-12-16: 5 mg via ORAL
  Administered 2020-12-17 – 2020-12-18 (×3): 10 mg via ORAL
  Administered 2020-12-18: 5 mg via ORAL
  Administered 2020-12-19: 10 mg via ORAL
  Administered 2020-12-19: 5 mg via ORAL
  Administered 2020-12-20 (×2): 10 mg via ORAL
  Administered 2020-12-20: 5 mg via ORAL
  Administered 2020-12-21: 10 mg via ORAL
  Administered 2020-12-21: 5 mg via ORAL
  Administered 2020-12-21 – 2020-12-23 (×5): 10 mg via ORAL
  Administered 2020-12-23: 5 mg via ORAL
  Filled 2020-12-07 (×6): qty 2
  Filled 2020-12-07: qty 1
  Filled 2020-12-07 (×2): qty 2
  Filled 2020-12-07 (×2): qty 1
  Filled 2020-12-07 (×5): qty 2
  Filled 2020-12-07: qty 1
  Filled 2020-12-07 (×10): qty 2
  Filled 2020-12-07: qty 1
  Filled 2020-12-07: qty 2
  Filled 2020-12-07 (×2): qty 1

## 2020-12-07 MED ORDER — ACETAMINOPHEN 160 MG/5ML PO SOLN
650.0000 mg | Freq: Four times a day (QID) | ORAL | Status: DC
  Administered 2020-12-07 – 2020-12-16 (×26): 650 mg via ORAL
  Filled 2020-12-07 (×29): qty 20.3

## 2020-12-07 MED ORDER — NYSTATIN 100000 UNIT/GM EX POWD
Freq: Two times a day (BID) | CUTANEOUS | Status: DC
  Administered 2020-12-10 – 2020-12-16 (×3): 1 via TOPICAL
  Filled 2020-12-07: qty 15

## 2020-12-07 MED ORDER — PANTOPRAZOLE SODIUM 40 MG PO TBEC
40.0000 mg | DELAYED_RELEASE_TABLET | Freq: Every day | ORAL | Status: DC
  Administered 2020-12-08 – 2020-12-13 (×6): 40 mg via ORAL
  Filled 2020-12-07 (×9): qty 1

## 2020-12-07 MED ORDER — MIDODRINE HCL 5 MG PO TABS
40.0000 mg | ORAL_TABLET | ORAL | Status: DC
  Administered 2020-12-07 – 2020-12-14 (×9): 40 mg via ORAL
  Filled 2020-12-07 (×11): qty 8

## 2020-12-07 MED ORDER — METOPROLOL TARTRATE 50 MG PO TABS
50.0000 mg | ORAL_TABLET | Freq: Two times a day (BID) | ORAL | Status: DC
  Administered 2020-12-08 – 2020-12-09 (×4): 50 mg via ORAL
  Filled 2020-12-07 (×5): qty 1

## 2020-12-07 MED ORDER — HEPARIN SODIUM (PORCINE) 1000 UNIT/ML IJ SOLN
INTRAMUSCULAR | Status: AC
  Filled 2020-12-07: qty 4

## 2020-12-07 MED ORDER — GERHARDT'S BUTT CREAM
TOPICAL_CREAM | CUTANEOUS | Status: DC | PRN
  Filled 2020-12-07: qty 1

## 2020-12-07 MED ORDER — METOPROLOL TARTRATE 50 MG PO TABS: 50.0000 mg | ORAL_TABLET | Freq: Two times a day (BID) | ORAL | Status: DC

## 2020-12-07 NOTE — Evaluation (Signed)
Physical Therapy Wheelchair Evaluation Patient Details Name: Candace [name] MRN: [id number] DOB: 1950-12-25 Today's Date: 12/07/2020   History of Present Illness  Pt 70 y.o. female with medical history significant for recent covid pna, obesity, colon cancer s/p colon resection with colostomy bag, HLD, NIDDM2, CKD3 presented to ED after her follow-up nurse advised her to present to ED for elevated HR. +afib, elevated troponins with demand ischemia,  CT scan is positive for bowel perforation with pneumomediastinum 11/03 exp lap with repair of perforated ulcer. 11/10 t/f to ICU- intubated/sedated/proned. Trach placed 11/24. CRRT off 12/8, restarted 12/10-1/5. 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  Clinical Impression  Met with Josh Cadle, [employer] from [place], pt, daughter and PT to measure/assess pt for custom WC.  Pt just arrived from HD and discussion of needs based on her medical issues, family's home situation, pt's potential recovery were discussed.  See form for details (form to be scanned in manually).  PT will continue to follow acutely for safe mobility progression and Josh hopeful to have a "loaner" electric WC here on Friday for Candace [name] to try.      Follow Up Recommendations SNF    Equipment Recommendations  Hospital bed;Other (comment) (air mattress, hoyer, custom WC)    Recommendations for Other Services       Precautions / Restrictions Precautions Precautions: Fall Precaution Comments: baseline R colostomy; sacral wound with wound vac, trach with PMV      Mobility  Bed Mobility                    Transfers                    Ambulation/Gait                Stairs            Wheelchair Mobility    Modified Rankin (Stroke Patients Only)       Balance                                             Pertinent Vitals/Pain Pain  Assessment: Faces Faces Pain Scale: Hurts little more Pain Location: bottom Pain Descriptors / Indicators: Grimacing Pain Intervention(s): Limited activity within patient's tolerance;Monitored during session;Repositioned    Home Living Family/patient expects to be discharged to:: Private residence Living Arrangements: Spouse/significant other;Children;Other relatives Available Help at Discharge: Family;Available 24 hours/day Type of Home: House Home Access: Stairs to enter Entrance Stairs-Rails: Psychiatric nurse of Steps: 4 Home Layout: One level Home Equipment: Bedside commode;Walker - 2 wheels Additional Comments: Daughter plans to build WC ramp    Prior Function Level of Independence: Independent               Hand Dominance   Dominant Hand: Right    Extremity/Trunk Assessment   Upper Extremity Assessment Upper Extremity Assessment: Generalized weakness;RUE deficits/detail;LUE deficits/detail RUE Deficits / Details: right arm weaker than L arm with decreased ROM at fingers elbowl and shoulder.  Unable to bring hand to face, grip weak and pincer in nature LUE Deficits / Details: weak, but more rounded grasp, elbow flexion to bring hand to face, shoulder flexion weak against gravity with upper trap and levator activation scapular elevation to attempt to lift arm up.  Lower Extremity Assessment RLE Deficits / Details: ankle DF ROM to neutral, DF strength trace, PF strength 3-/5, knee trace ROM limited to ~8-80 degrees of flexion. LLE Deficits / Details: left leg stronger with weak DF/PF 2+/5 DF, 3-/5 PF, knee flexion trace and ROM to 8-95 degrees on L.  Per daughter pt has bad knee OA.    Cervical / Trunk Assessment Cervical / Trunk Assessment: Other exceptions Cervical / Trunk Exceptions: rounded, posteriorly dumped pelvis and rounded upper and lower trunk, scapula retracted and elevated, weak head control (although she could lift heand, not upper  trunk breifly against gravity when asked today).  Communication   Communication: Tracheostomy;Other (comment) (capped)  Cognition Arousal/Alertness: Awake/alert Behavior During Therapy: WFL for tasks assessed/performed Overall Cognitive Status: Impaired/Different from baseline Area of Impairment: Orientation;Attention;Memory;Following commands;Safety/judgement;Awareness;Problem solving                 Orientation Level: Disoriented to;Time;Situation Current Attention Level: Selective Memory: Decreased recall of precautions;Decreased short-term memory Following Commands: Follows one step commands with increased time Safety/Judgement: Decreased awareness of safety;Decreased awareness of deficits Awareness: Emergent Problem Solving: Slow processing;Decreased initiation;Difficulty sequencing;Requires verbal cues;Requires tactile cues General Comments: Pt able to focus on me despite others talking in the room, slow to follow commands, but also some physical slowness due to weakness.      General Comments General comments (skin integrity, edema, etc.): Session with Josh from [employer] for WC measurement and assessment.    Exercises Other Exercises Other Exercises: HR 130s at rest during session, BP and O2 sats stable   Assessment/Plan    PT Assessment Patient needs continued PT services  PT Problem List Decreased strength;Decreased activity tolerance;Decreased balance;Decreased mobility;Decreased cognition;Decreased knowledge of precautions;Cardiopulmonary status limiting activity;Pain;Decreased safety awareness;Decreased knowledge of use of DME       PT Treatment Interventions DME instruction;Gait training;Stair training;Functional mobility training;Therapeutic activities;Therapeutic exercise;Balance training;Neuromuscular re-education;Cognitive remediation;Patient/family education    PT Goals (Current goals can be found in the Care Plan section)  Acute Rehab PT Goals Patient Stated  Goal: to go home, recover at home with family    Frequency Min 2X/week   Barriers to discharge        Co-evaluation               AM-PAC PT "6 Clicks" Mobility  Outcome Measure Help needed turning from your back to your side while in a flat bed without using bedrails?: A Lot Help needed moving from lying on your back to sitting on the side of a flat bed without using bedrails?: Total Help needed moving to and from a bed to a chair (including a wheelchair)?: Total Help needed standing up from a chair using your arms (e.g., wheelchair or bedside chair)?: Total Help needed to walk in hospital room?: Total Help needed climbing 3-5 steps with a railing? : Total 6 Click Score: 7    End of Session Equipment Utilized During Treatment: Other (comment) (pt on RA today with capped trach) Activity Tolerance: Patient limited by pain;Patient limited by fatigue Patient left: in bed;with call bell/phone within reach;with nursing/sitter in room;with family/visitor present Nurse Communication: Other (comment) (pt is wet and needs changing) PT Visit Diagnosis: Muscle weakness (generalized) (M62.81);Difficulty in walking, not elsewhere classified (R26.2);Pain;Adult, failure to thrive (R62.7);Unsteadiness on feet (R26.81) Pain - Right/Left: Right Pain - part of body: Knee    Time: 1655-3748 PT Time Calculation (min) (ACUTE ONLY): 56 min   Charges:   PT Evaluation $PT Re-evaluation: 1 Re-eval  Verdene Lennert, PT, DPT  Acute Rehabilitation [phone] pager [phone] office

## 2020-12-07 NOTE — Progress Notes (Signed)
Patient transported off unit for dialysis.

## 2020-12-07 NOTE — Progress Notes (Signed)
[facility] NEPHROLOGY PROGRESS NOTE  Assessment/ Plan:  # End-stage renal disease from prolonged dialysis dependent acute kidney Injury: Suspected to be multifactorial acute kidney injury from septic shock/MRSA pneumonia and hemorrhagic shock at the outside.  Has been on renal replacement therapy since 09/08/2020 and recently able to transition to intermittent hemodialysis from CRRT.  Intermittent hemodialysis has been challenging due to hypotension and positional discomfort from decubitus ulcer.  Creatinine remains misleadingly low and is indicative of significant lean muscle mass loss with prolonged hospitalization; urine output incompletely charted.  She is not a candidate for CIR and family have no desire to have her admitted to SNF or LTAC and intend to take her home upon discharge.  Continue TTS dialysis while here; next treatment today. .   # History of Covid pneumonia with ARDS: Status post tracheostomy and respiratory status appears to be stable with supplementation via T-bar.  # Atrial fibrillation with rapid ventricular response: on metoprolol, tachycardic.  On anticoagulation with Eliquis.  # Severe protein calorie malnutrition with large sacral decubitus ulcer: Ongoing nutritional supplementation with wound care.  # CKD-MBD: Calcium and phosphorus level acceptable.  Continue monitor.  # Anemia: Complicated by GI bleed from perforated duodenal ulcer status post exploratory laparotomy with Phillip Heal patch placement.    Receiving IV iron and ESA.  Monitor hemoglobin.  Subjective: Seen and examined. She is alert awake and following commands.  Increased oral secretions therefore suctioning done.  No new event.  Dialysis today. Objective Vital signs in last 24 hours: Vitals:   12/07/20 0000 12/07/20 0318 12/07/20 0749 12/07/20 0808  BP: (!) 118/57  123/78   Pulse:  91 (!) 105 (!) 104  Resp: (!) 21  (!) 25   Temp:   98.6 F (37 C)   TempSrc:   Oral   SpO2: 100%  100%  100%  Height:       Weight change:   Intake/Output Summary (Last 24 hours) at 12/07/2020 0825 Last data filed at 12/07/2020 0258 Gross per 24 hour  Intake 963 ml  Output 750 ml  Net 213 ml       Labs: Basic Metabolic Panel: Recent Labs  Lab 12/05/20 0610 12/06/20 0117 12/07/20 0550  NA 138 136 136  K 3.2* 3.4* 3.4*  CL 105 107 106  CO2 22 19* 19*  GLUCOSE 131* 181* 147*  BUN 35* 35* 46*  CREATININE 0.95 1.03* 0.90  CALCIUM 9.1 9.5 10.0  PHOS 3.7 3.7 3.5   Liver Function Tests: Recent Labs  Lab 12/05/20 0610 12/06/20 0117 12/07/20 0550  ALBUMIN 2.1* 2.1* 2.0*   No results for input(s): LIPASE, AMYLASE in the last 168 hours. No results for input(s): AMMONIA in the last 168 hours. CBC: Recent Labs  Lab 11/30/20 1248 12/04/20 0819 12/06/20 0117 12/07/20 0550  WBC 16.4* 16.6* 13.7* 17.2*  NEUTROABS  --   --  9.9* 12.1*  HGB 8.3* 7.5* 7.7* 7.4*  HCT 27.2* 24.2* 26.2* 25.2*  MCV 85.5 84.9 87.6 86.9  PLT 369 417* 355 395   Cardiac Enzymes: No results for input(s): CKTOTAL, CKMB, CKMBINDEX, TROPONINI in the last 168 hours. CBG: Recent Labs  Lab 12/06/20 1236 12/06/20 1611 12/06/20 2128 12/06/20 2327 12/07/20 0745  GLUCAP 140* 220* 155* 139* 118*    Iron Studies: No results for input(s): IRON, TIBC, TRANSFERRIN, FERRITIN in the last 72 hours. Studies/Results: No results found.  Medications: Infusions: . albumin human 25 g (11/16/20 1516)  . feeding supplement (NEPRO CARB STEADY)    .  vancomycin      Scheduled Medications: . acetaminophen (TYLENOL) oral liquid 160 mg/5 mL  650 mg Oral Q6H  . apixaban  2.5 mg Oral BID  . chlorhexidine gluconate (MEDLINE KIT)  15 mL Mouth Rinse BID  . Chlorhexidine Gluconate Cloth  6 each Topical Daily  . Chlorhexidine Gluconate Cloth  6 each Topical Q0600  . cholecalciferol  2,000 Units Oral Daily  . collagenase   Topical BID  . darbepoetin (ARANESP) injection - DIALYSIS  100 mcg Intravenous Q Sat-HD  .  feeding supplement (NEPRO CARB STEADY)  237 mL Oral TID BM  . feeding supplement (PROSource TF)  45 mL Per Tube TID  . guaiFENesin  15 mL Oral Q12H  . insulin aspart  0-6 Units Subcutaneous TID WC  . mouth rinse  15 mL Mouth Rinse QID  . megestrol  200 mg Oral QID  . metoprolol tartrate  50 mg Oral BID  . midodrine  40 mg Oral 3 times per day on Mon Wed Fri  . multivitamin  1 tablet Oral QHS  . nutrition supplement (JUVEN)  1 packet Per Tube BID BM  . pantoprazole sodium  40 mg Oral BID  . sodium chloride flush  10-40 mL Intracatheter Q12H    have reviewed scheduled and prn medications.  Physical Exam: General:NAD, comfortable, tracheostomy Heart:RRR, s1s2 nl Lungs:clear b/l, no crackle Abdomen:soft, Non-tender, non-distended Extremities: Trace dependent edema. Dialysis Access: Right IJ TDC in place.  Candace [name] Prasad Elizabeth Haff 12/07/2020,8:25 AM  LOS: 97 days

## 2020-12-07 NOTE — Progress Notes (Signed)
NAMEMiki [name], MRN:  [id number], DOB:  Mar 17, 1951, LOS: 97 ADMISSION DATE:  08/31/2020, CONSULTATION DATE:  09/07/2020 REFERRING MD:  Dr Candace [name], CHIEF COMPLAINT:  Acute resp failure  Brief History   70 year old female who was previously diagnosed with Covid 08/23/2020.  Admitted 11/2 with AF-RVR, found to have a perforated duodenal underwent exploratory laparotomy with Phillip Heal patch placement 11/3.  Past Medical History  Covid pneumonia Atrial fibrillation CKD stage III Diabetes mellitus Hypertension Colon cancer Hyperlipidemia  Significant Hospital Events   11/2 Admitted  11/3 OR with findings of perforated duodenal ulcer 11/10 progressive hemorrhagic shock, intubated, transfused, pressors, proned; started on CRRT in PM 11/03 Exploratory laparotomy, Phillip Heal patch, lysis of adhesion for duodenal ulceration postop day 6 11/16 Extubated. Re-intubated overnight due to respiratory distress and hypoxia with decreased mentation 11/18 Bronch, cultures sent 11/19 Hgb down getting blood 11/24 Spiked fever resume empirical antimicrobial therapy 11/26 Hemorrhagic shock, hgb 5.6, increased pressors, CT A/P  11/30 Per palliative "[name] expresses understanding that patient is unfortunately very fragile despite ongoing intensive medical treatment and full mechanical support. She indicates that the family wants to continue with all current interventions despite potential outcomes". 12/08 CRRT discontinued due to clotting 12/09 Family requested transfer to tertiary care [facility]). [facility] denied transfer  12/10 CRRT restarted. Episodes of tachycardia, tachypnea that seem to improve with pain management 12/11 Back in shock. Pressor requirements up. CXR worse. ABX resumed 12/12 Still requiring inc pressors. Had hypoglycemic event. Basal insulin dosing adjusted 12/13 Pressor requirements better. Now hyperglycemic. Re-adjusted Glycemic control  12/14 Changed dilaudid to1/2 dosing from day further. D/c  vasopressin.  Goals of care reconfirmed with daughter.  Patient continues to desire aggressive care.  Not open to discussing any other option, patient family continues to be hopeful that she will be discharged to home with full recovery in spite of multiple attempts by staff to prepare them that this is unlikely scenario 12/15 Dilaudid discontinued sending cortisol for ongoing pressor dependence 12/16 Serum cortisol <20, added stress dose steroids.  PRN Dilaudid, attempting not go back on Dilaudid infusion 12/17 Developed worsening tachycardia during the evening hours received initially IV albumin, followed by resuming IV Dilaudid with question of suboptimal pain control.  Currently looks better back on Dilaudid drip.  We have been able to wean pressors after adding stress dose steroids; near arrest - bradycardia, better with atropine  12/19 Afebrile . Remains on dilaudid and heparin gtt, dilaudid gtt increased overnight for concern of pain / ongoing tachycardia, no other events . NE and precedex off 12/17. Ongoing CRRT- even UF, dosing lokelmia this morning 12/20 On CRRT.  Renal plans for HD cath and moving to HD. Getting wound care  12/23 -No vent weaning per RT, remains on full support, #8 trach in place but previously had #6, no documentation of change noted in chart Afebrile / WBC 20.4  Vent - 30% FiO2, PEEP 5 Glucose range 176-212 I/O 465 ml stool, 3.6L removed with HD, -1.1L in last 24 hours  RN reports ongoing periods of tachypnea / vent dyssynchrony that responds to dilaudid   12/24 - renal stopping CRRT today and plans HD 10/24/20 . 40% fio2 on vent via Trach. TAchypenic and tachycardic. Afebrile but wbc up to 27.6K. On TF. Onn heparin gtt  12/25 - Back on CRRT. On vent via trach at 40% fio2, On scheduled dilaudid as add on to oxy. Per family request 12/24 - they felt scheduled oxy was not adequate and patient was showing  signs of opioid withdrawal.  Patient also had  worsening SIRS/sepsis  syndrome. Had fever, rising wbc, worsening RR 40-60 and HR 140s sinus-> started On abx yesterday. Fever some better today. WBC plateau at 28,.5K. On new levophed -> signifanct improvement in HR 77 and RR t0 20. On heparin gtt.  On precedex gtt. On levophed gtt 2mcg wthi midodrine. On TF 12/30 Remains on CRRT with intermittent pressor requirements. Ethics consult placed evening of 12/29. Ethics rec time trial of CRRT 12/31 failed SBT with RR 40s. Several conversations between care tam and family, who are upset RE plan of care 1/1 back on pressors  1/4: On pressors, keeping even on CVVHD, HGB drop to 6.9, transfused 1 unit. Improving mental status 1/10 remains on low dose levophed 14mcg 1/11 off levo, attempting HD with UF for first time.  -ethics consulted 12/30-- recs for time limited trial of CRRT, please see separate Ethics consult note for full details. Futility policy reviewed with family by ethics. Ongoing meetings with Dr. Hulen Skains with nursing administration and family. The ultimate goal is to transition her safely to HD from CRRT. 11/16/2020 again will be placed on intermittent hemodialysis. 11/16/2018 wheeze criteria for transition to progressive care since she has been off the ventilator for 72 hours. With an order for transfer to progressive care. 11/18/2020 is doing well on progressive care unit. 1/24 copious secretions 1/27 downsized to #6 cuffed 1/29 rapid response called during HD for SVT 2/8 try capping trach  Consults:  Cardiology [facility] Nephrology  Ethics  Procedures:  R PICC 11/5 >>out A line 11/9 >> out ETT 11/9 > 11/16, 11/16 >> 09/21/2020, 09/21/2020 tracheostomy>> Lt [place] CVL 11/9 >> R IJ trialysis >> out HD catheter 12/1 >>12/20 12/21 - 14.5 Fr, 23 cm right IJ tunneled hemodialysis catheter placement.  Removal of indwelling subclavian catheter.   Significant Diagnostic Tests:  11/3 CT abd/ pelvis > 1. Positive for bowel perforation: Pneumoperitoneum and intermediate  density free fluid in the abdomen. Prior total colectomy. The specific site of perforation is unclear-oral contrast present to the proximal jejunum has not obviously leaked. Note that there may be small bowel loops adherent to the ventral abdominal wall along the greater curve of the stomach. 2. Extensive bilateral lower lung pneumonia. No pleural effusion. 3. Other abdominal and pelvic viscera are stable since 2015, including bilateral adrenal adenomas. Chronic renal parapelvic cysts. 4. Aortic Atherosclerosis 11/3 TTE > EF 70-75%, RV not well visualized, mildly reduced RV systolic function 06/30 CT chest/ abd/ pelvis> 1. Interval progression of diffuse bilateral hazy ground-glass airspace opacities with more focal areas of consolidation at the lung bases 2. Trace bilateral pleural effusions. 3. Postsurgical changes the abdomen as detailed above. No evidence for a postoperative abscess, however evaluation is limited by lack of IV contrast. 4. There is a 1.9 cm cystic appearing lesion located in the pancreatic body. This was not present on the patient's CT from 2015.  Follow-up with an outpatient contrast enhanced MRI is recommended. 5. The endometrial stripe appears diffusely thickened. Follow-up with pelvic ultrasound is recommended. Aortic Atherosclerosis 11/14 LE doppler studies > + DVT of right posterior tibial and peroneal vein, +dVT of left posterior tibial vein  11/26 CT ABD PEL > liver unremarkable, distended gallbladder with layering tiny gallstones versus sludge, no duct dilatation, mild hyperdensity right upper pole renal collecting system new.  No evidence of retroperitoneal bleeding.  Multifocal lower lobe predominant pulmonary infiltrates/pneumonia.  Small left pleural effusion. 1/16 Venous duplex RUE >> negative  Micro Data:  11/10 MRSA PCR > neg 11/10 BC x 2 > neg 09/22/2020 blood cultures x2>> S epi.  09/22/2020 sputum culture>> MRSA Blood 12/1 >> negative. S.epi 1 out of 2,  likely contaminant BCx 2 12/12 >> neg xxx Trach aspirate 12/24 - FEW STAPHYLOCOCCUS AUREUS  Blood 12/24 > Negative  Blood 1/2: neg Sputum 1/4: MSSA resp 1/24 MSSA  Antimicrobials:  Per primary  Subjective:   Trach CDI  Objective   Blood pressure 123/78, pulse (!) 104, temperature 98.6 F (37 C), temperature source Oral, resp. rate (!) 25, height 5\' 8"  (1.727 m), weight 92.6 kg, SpO2 100 %.        Intake/Output Summary (Last 24 hours) at 12/07/2020 1030 Last data filed at 12/07/2020 0258 Gross per 24 hour  Intake 963 ml  Output 750 ml  Net 213 ml   Filed Weights   Physical Exam  Awake and interactive # 4 trach with PMR speech clear. Not requiring tracch suctioning  Decreased bs, 100% sats HSD Abd obese , eating Ext + edema   Resolved problems:  Previously hemorrhagic shock requiring PRBCs, Septic shock, MRSA pneumonia s/p 8 days abx Complicated by MRSA healthcare associated pneumonia (treated) Peritonitis and perforated ulcer post repair 09/01/20   Assessment:  Candace [name] is a 70 y.o. with history of COVID 19 infection requiring hospitalization in October 2021 with current admission for hemorrhagic shock secondary to perforated gastric ulcer requiring surgical intervention and [place]. Her pulmonary issues are as follows:   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 status post tracheostomy Stable bilateral pneumonia VDRF resolved 2/8 try capping trach.   Best practice:  Per primary   Gaylyn Lambert ACNP Acute Care Nurse Practitioner [place] Please consult [place] 12/07/2020, 10:30 AM

## 2020-12-07 NOTE — Progress Notes (Signed)
Colostomy bag filled with Nepro after receiving the evening dose.  Ostomy content coming out from under the wafer and soiled patient over abdomen and periarea.  Cleansed skin around stoma and placed available wafer and colostomy bag. Cleansed abdominal wound and placed new dressing per order.  Bathed patient's torso front and back and placed on new draw sheet.  No ostomy content noted in or around the sacral wound vac dressing. Cleansed and dried periarea and applied barrier cream and new purewik placed.  Gown changed and fresh blankets given and applied and warm blanket from the warmer.  Called daughter per patient request.

## 2020-12-07 NOTE — Consult Note (Signed)
[place] Nurse wound follow up Patient receiving care in [place]. I have reviewed the PT hydrotherapy notes for the sacral wound, including photo.  The wound can benefit from continued involvement by PT and their therapies for wound care.  Continue current POC. Val Riles, RN, MSN, CWOCN, CNS-BC, pager [phone]

## 2020-12-07 NOTE — Consult Note (Signed)
[facility] Nurse ostomy follow up Stoma type/location: RUQ ileostomy present since 2015.  Prior to this admission the patient was independent in ostomy care. She tells me her daughter, Raul Del, has performed ostomy care while she has been in the hospital. No family present at time of my visit. Stomal assessment/size:  1 inch round, budded, moist Peristomal assessment: intact Treatment options for stomal/peristomal skin: barrier ring Output: thin brown liquid  Ostomy pouching: 2pc. Nurse from last night had placed a pediatric 1 3/4 inch pouch and barrier.  I removed these and placed the 2 1/4 inch barrier, barrier ring, and pouch.  She has plenty of pouches in the room. I requested the secretary order additional barrier rings and skin barriers. Education provided: none Enrolled patient in [place] Start Discharge program: No Val Riles, RN, MSN, [employer], CNS-BC, pager [phone]

## 2020-12-07 NOTE — Progress Notes (Signed)
Repositioned patient from lying left side to supine.  Performed BLLE ROM. Patient requests to go back to lying on left side at this time to manage discomfort in sacral area.  Pain meds offered several times.  Patient refuses each time with a firm "NO".

## 2020-12-07 NOTE — Progress Notes (Addendum)
[facility] PROGRESS NOTE  Candace [name] JWJ:[id number] DOB: 03/09/1951 DOA: 08/31/2020 PCP: Algis Greenhouse, MD            Status: Remains inpatient appropriate because:Ongoing diagnostic testing needed not appropriate for outpatient work up, Unsafe d/c plan, IV treatments appropriate due to intensity of illness or inability to take PO and Inpatient level of care appropriate due to severity of illness   Dispo: The patient is from: Home              Anticipated d/c is to: Home with home health               Anticipated d/c date is: > 3 days              Patient currently is not medically stable to d/c.  Barriers to discharge: Continues to require HD- unable to sit in chair/get in [facility] for OP HD-also requiring Cortrak for nocturnal feeds; still has trach with copiuous secretions at times  Considerations for home discharge: 1. Patient will need to continue wound VAC and current wound VAC is apparently not available in the home setting 2. It is likely she will need a specialty bed.  We are currently transitioning back to the [facility] rotational bed which will allow for better in bed mobility including the ability to "stand" with PT in the bed without actually getting patient out of bed 3. Likely would need to be decannulated to minimize other care issues after discharge 4. Would need to be able to either mobilize for outpatient hemodialysis or be a candidate for home dialysis 5. Would not have aggressive PT OT available in the home setting that she can achieve during hospitalization 6. Patient will have family members that are CNAs managing her after discharge but this would be 24/7 requirement and this could be an overwhelming issue once patient is actually home  Code Status: Full Family Communication: 2/3 daughter [name] at bedside-extensive conversation held at bedside with daughter to include myself, Retail buyer, Editor, commissioning, and [facility] personnel regarding expectations  for IP care and dc planning DVT prophylaxis: Eliquis Vaccination status: Has not been vaccinated but did have severe COVID infection initially diagnosed on 08/23/2020.  Patient will be eligible for COVID-vaccine after 11/23/2020   Foley catheter: No, purewick female urinary collection device   HPI: 70 year old female patient with prior history of diabetes, stage III chronic kidney disease, atrial fibrillation, hypertension, dyslipidemia and colon cancer that is post colectomy and colostomy.  Initially diagnosed with COVID on 08/23/20 she was admitted to the hospital due to dehydration, acute kidney injury and hypoxemic respiratory failure.  She was discharged home on 2 to 3 L of oxygen after being treated with IV steroids and remdesivir.  During that time although she had elevated inflammatory markers she had no embolic or thrombotic disease.  She had been on prophylactic dose heparin during the hospitalization and was placed on Eliquis for 2 weeks for after discharge  Patient returned to the ER on 08/31/2020 for complaints of not feeling well.  She was found to be in atrial fibrillation with RVR, hemorrhagic shock and work-up revealed a perforated duodenal ulcer.  She underwent exploratory laparotomy on 11/3.  She has had an extensive ICU stay with significant events as below.   11/2 Admitted with rapid Afib 11/3 OR with findings of perforated duodenal ulcer 11/10 progressive hemorrhagic shock, intubated, transfused, pressors, proned; started on CRRT in PM 11/16 Extubated. Re-intubated overnight due to respiratory distress  and hypoxia with decreased mentation 11/18 Bronch, cultures sent 11/19 Hgb down getting blood 11/24 Spiked fever, resume empirical antimicrobial therapy 11/26 Hemorrhagic shock, hgb 5.6, increased pressors,  11/30 Per palliative "[name] understanding that patient is unfortunately very fragiledespite ongoing intensive medical treatment and full mechanical support.  [name] that the familywantsto continue with all current interventions despite potential outcomes". 12/08 CRRT discontinued due to clotting 12/09 Family requested transfer to tertiary care [facility] denied transfer  12/10 CRRT restarted. Episodes of tachycardia, tachypnea that seem to improve with pain management 12/11 Back in shock. Pressor requirements up. CXR worse. ABX resumed 12/12 Still requiring inc pressors. Had hypoglycemic event. Basal insulin dosing adjusted 12/13 Pressor requirements better. Now hyperglycemic. Re-adjusted Glycemic control  12/19 Afebrile . Remains on dilaudid and heparin gtt, dilaudid gtt increased overnight for concern of pain / ongoing tachycardia, no other events . NE and precedex off 12/17. Ongoing CRRT- even UF, dosing lokelmia this morning 12/20 On CRRT. Renal plans for HD cath and moving to HD. Getting wound care 12/24 - renal stopping CRRT today and plans HD 10/24/20 . 40% fio2 on vent via Trach. TAchypenic and tachycardic. Afebrile but wbc up to 27.6K. On TF. On heparin gtt 12/25 - Back on CRRT. On vent via trach at 40% fio2, On scheduled dilaudid as add on to oxy. Per family request 12/24 - they felt scheduled oxy was not adequate and patient was showing signs of opioid withdrawal.  Patient also had worsening SIRS/sepsis syndrome. Had fever, rising wbc, worsening RR 40-60 and HR 140s sinus->started On abx yesterday. Fever some better today. WBC plateau at 28,.5K. On new levophed ->signifanct improvement in HR 77 and RR t0 20. On heparin gtt. On precedex gtt. On levophed gtt 10mcg wthi midodrine. On TF 12/30 Remains on CRRT with intermittent pressor requirements. Ethics consult placed 12/29. Ethics rec time trial of CRRT 12/31 failed SBT with RR 40s. Several conversations between care tam and family, who are upset RE plan of care 1/1 back on pressors  1/4: On pressors, keeping even on CVVHD, HGB drop to 6.9, transfused 1 unit. Improving mental  status 1/10 remains on low dose levophed 86mcg 1/11 off levo, attempting HD with UF for first time. Now tolerating intermittent HD. 1/19 patient transferred to progressive bed with tracheostomy on 35% FiO2.  has NG tube feeding.  No PEG tube.  Received dialysis on 1/18. 1/24 significant improvement and persistent tachycardia after introduction of twice daily beta-blocker.  Heart rates have improved from the 130s to the 98-104 range 1/24 core track clogged and therefore has been removed by nutrition team.  Calorie count in progress and we are weaning any sedating medications which could be contributing to patient's inability to eat. 1/27 continue w/ cuff #6 trach   Subjective: Awake and alert.  Frustrated that she requested a cup of coffee overnight and was told she could not have it because it was bedtime.  Also frustrated that tube feeding apparently given as a bolus instead of via pump.  She reported a sensation of fullness and later her ostomy bag became completely full with tube feeding and became dislodged from the skin.  Daughter at bedside and is aware of all of these issues.  Discussed with daughter pain management in regards to significant pain occurring over patient's bottom in relation to the sacral decubitus especially when attempting to sit up in the chair.  She is agreeable to changing the oxycodone dose from 5 mg to 5 to 10 mg  as needed.    Objective: Vitals:   12/07/20 0000 12/07/20 0318  BP: (!) 118/57   Pulse:  91  Resp: (!) 21   Temp:    SpO2: 100%     Intake/Output Summary (Last 24 hours) at 12/07/2020 0740 Last data filed at 12/07/2020 0258 Gross per 24 hour  Intake 963 ml  Output 750 ml  Net 213 ml   Filed Weights    Exam: Constitutional: Awake, interactive, calm Respiratory: Anterior lung sounds clear to auscultation, no increased work of breathing.  FiO2 21%. #4 cuffed trach with PMV in place-no secretions noted no wet cough Cardiovascular: Normal heart  sounds, pulse regular with heart rate averaging between 89 and 109 bpm, extremities warm to touch without peripheral edema Abdomen: On regular diet with nectar thick liquids- LBM 2/2-core track in place for nocturnal tube feedings only. colostomy stoma unremarkable with small amount of liquid brown stool noted Skin:  Massive sacral decubitus ulcer-wound VAC now in place Neurologic: CN 2-12 grossly intact. Sensation intact, DTR normal. Strength 1/5 x all 4 extremities.  Psychiatric: Awake, somewhat flat affect, oriented x3   Assessment/Plan: Acute problems: PAF maintaining sinus rhythm w/persistent tachycardia -Continue metoprolol 50 mg twice daily.  Pulse well controlled -Echocardiogram this admission with an EF 50 to 55% with mild diastolic dysfunction parameters and normal RV systolic function -Continue Eliquis-benefits coordinator has determined that Eliquis is covered at [facility] in [place] with a co-pay of $47  Acute respiratory failure secondary to COVID-pneumonia/tracheostomy/acute MSSA tracheobronchitis -Patient stable on low-flow oxygen FiO2 21% -PCCM/trach team following-change to #4 trach on 2/4 -Repeat respiratory culture on 1/24 with abundant staph with sensitivities pending -Completed IV Ancef for MSSA tracheobronchitis -Continue  suctioning and chest PT-Vibra vest -Continue PMV training   Coag neg staph-1/2 blood cultures positive -dw ID pharmacist- cultures c/w contamination so will dc Vancomycin ordered overnight -PICC site unremarkable.  Discussed with IV team and unless PICC site has signs of infection or possible associated thrombus that PICC lines can remain in place per manufacturer for at least 12 months.  Acute kidney injury secondary to COVID-related sepsis with shock superimposed on stage III chronic kidney disease -Continue intermittent hemodialysis as directed by the renal team -Avoid nephrotoxic medications -Continue Aranesp on dialysis days -Patient is voiding  and in the past 24 hours had 300 cc of urine out -BP improved therefore will change midodrine to use on dialysis days only 3 times daily  Lower extremity muscle pain -Continue Crea topically -Continue PROM and AROM -Family to bring in tennis shoes to wear in an alternating schedule to help improve foot flexion noting this improves her leg pain  Dysphagia/moderate to severe protein calorie malnutrition Nutrition Status: Nutrition Problem: Increased nutrient needs Etiology: wound healing Signs/Symptoms: estimated needs Interventions: Refer to RD note for recommendations  Estimated body mass index is 31.04 kg/m as calculated from the following:   Height as of this encounter: $RemoveBeforeD'5\' 8"'[id number]$  (1.727 m).   Weight as of this encounter: 92.6 kg.  -2/3: Continue regular diet with nectar thick liquids -2/7 at recommendation of nutrition team resumed nocturnal tube feedings -Continue Megace  Acute hemorrhagic shock secondary to perforated duodenal ulcer/anemia of critical illness -Stable postoperatively -Hemoglobin remains stable greater than 8.0  Diabetes mellitus 2 -Patient has been transition to regular diet along with tube feedings and we have noted significant increase in CBG readings -Continue very sensitive SSI with meals -HgbA1c 08/24/2020 and was 7.0 -Prior to admission patient was on metformin XR-unable to  use at this juncture due to severity of kidney disease -we are hopeful for recovery of renal function  Profound physical deconditioning/acute encephalopathy secondary to prolonged hospital stay/orthostasis -SNF recommended but family wants to take patient home -At this juncture patient unable to sit up in bed independently much less stand pivot and rotate to get into wheelchair and would be significant fall risk and care risk at home -In addition patient would need to be able to get in wheelchair to transport to dialysis if dialysis were to be continued in the outpatient setting -family  refuses LTAC or SNF rehab due to pt prior request to NEVER be placed in any type of facility -Continue high-dose midodrine 3 times daily on dialysis days only -Persistent tachycardia secondary to severe deconditioning has improved with the introduction of beta-blockers -Continue PROM every 4 hours-patient encouraged to perform independent AROM as well -2/3 order placed for repositioning every 2 hours -2/2: Successful out of bed to chair with initial short-term plan for 45 minutes to 1 hour today -FAMILY THAT WILL PROVIDE CARE FOR PT WILL NEED TO ASSIST WITH CARE OF THIS PT AT THE BEDSIDE BEFORE DC HOME  Stage IV sacral decubitus -Not present on admission -Wound care nurse following and utilizing Santyl and hydrotherapy and now has wound VAC in place -Surgical team consulted.  Current recommendation is against pursuing general anesthesia to undergo extensive surgical procedure-hopefully can be reevaluated in the future if wound does not heal adequately.  As of 1/28 they have nothing further acutely to add but will be following peripherally. [facility] rotational bed  -Continue low-dose oxycodone for pain patient complaining of inadequate pain control at sacral decubitus.   -  11/24/2020 after [facility]      11/22/2020                       11/29/2020   12/06/20   Incision (Closed) 09/01/20 Abdomen (Active)  Date First Assessed/Time First Assessed: 09/01/20 1737   Location: Abdomen    Assessments 09/01/2020  6:25 PM 12/07/2020  1:00 AM  Dressing Type Gauze (Comment) -  Dressing - Changed  Site / Wound Assessment Dressing in place / Unable to assess Granulation tissue;Red;Pink  Drainage Amount None -     No Linked orders to display     Pressure Injury 09/17/20 Ear Left;Anterior;Posterior Stage 2 -  Partial thickness loss of dermis presenting as a shallow open injury with a red, pink wound bed without slough. (Active)  Date First Assessed/Time First Assessed: 09/17/20 0800   Location: Ear  Location  Orientation: Left;Anterior;Posterior  Staging: Stage 2 -  Partial thickness loss of dermis presenting as a shallow open injury with a red, pink wound bed without slough. ...    Assessments 09/17/2020  8:00 AM 12/05/2020  8:00 PM  Dressing Type Foam - Lift dressing to assess site every shift None  Dressing Clean;Dry;Intact -  Dressing Change Frequency Every 3 days -  Site / Wound Assessment Dry;Pink -  Peri-wound Assessment Intact -  Wound Length (cm) 2 cm -  Wound Width (cm) 1 cm -  Wound Depth (cm) 0.25 cm -  Wound Surface Area (cm^2) 2 cm^2 -  Wound Volume (cm^3) 0.5 cm^3 -  Margins Unattached edges (unapproximated) -  Drainage Amount Scant -  Drainage Description Serosanguineous -  Treatment Cleansed -     No Linked orders to display     Pressure Injury 09/25/20 Sacrum Bilateral;Medial Deep Tissue Pressure Injury - Purple or  maroon localized area of discolored intact skin or blood-filled blister due to damage of underlying soft tissue from pressure and/or shear. Purple, non-blanchable, b (Active)  Date First Assessed/Time First Assessed: 09/25/20 2000   Location: Sacrum  Location Orientation: Bilateral;Medial  Staging: Deep Tissue Pressure Injury - Purple or maroon localized area of discolored intact skin or blood-filled blister due to damage o...    Assessments 09/25/2020  5:00 PM 12/06/2020  8:00 PM  Dressing Type Foam - Lift dressing to assess site every shift Negative pressure wound therapy  Dressing Changed;Clean;Dry;Intact Intact;Dry;Clean     No Linked orders to display     Negative Pressure Wound Therapy Sacrum (Active)  Placement Date/Time: 11/22/20 1500   Wound Type: (c) Other (Comment)  Location: Sacrum    Assessments 11/22/2020  4:59 PM 12/06/2020  8:00 PM  Last dressing change 11/22/20 -  Site / Wound Assessment Granulation tissue;Pale;Yellow Dressing in place / Unable to assess  Peri-wound Assessment Intact -  Size see above -  Wound filler - Black foam 2 -  Wound  filler - White foam 0 -  Wound filler - Nonadherent 0 -  Wound filler - Gauze 0 -  Cycle Continuous -  Target Pressure (mmHg) 125 -  Instillation Volume 26 mL -  Instillation Solution Normal Saline -  Instillation Soak Time 10 minutes -  Instillation Therapy Time 3.5 hours -  Canister Changed No -  Dressing Status Intact -  Drainage Amount None -     No Linked orders to display     Other problems: History of stage IV colon cancer -Old colostomy  Acute encephalopathy with ICU delirium -Delirium has resolved and supportive meds of Klonopin and Seroquel have been weaned and discontinued  Hypomagnesemia -Continue oral magnesium -1/21 give 4 g magnesium IV x1 -Follow labs  DVT bilateral posterior tibial veins -Was not present during initial COVID admission -Continue Eliquis for now given severe debility and increased risk for developing recurrent DVT  Hypertension -Having issues with orthostasis requiring midodrine Prior to admission patient was on Norvasc  Dyslipidemia -Prior to admission patient was on Crestor-consider resuming soon once patient can swallow pills whole  Abnormal TSH -Nov 2021 TSH was 0.015-TSH and free T4, T3 normal this admission  Data Reviewed: Basic Metabolic Panel: Recent Labs  Lab 12/03/20 0312 12/04/20 0111 12/05/20 0610 12/06/20 0117 12/07/20 0550  NA 137 137 138 136 136  K 3.5 3.3* 3.2* 3.4* 3.4*  CL 100 107 105 107 106  CO2 24 19* 22 19* 19*  GLUCOSE 194* 198* 131* 181* 147*  BUN 75* 73* 35* 35* 46*  CREATININE 1.35* 1.24* 0.95 1.03* 0.90  CALCIUM 10.2 9.5 9.1 9.5 10.0  PHOS 4.5 4.3 3.7 3.7 3.5   Liver Function Tests: Recent Labs  Lab 12/03/20 0312 12/04/20 0111 12/05/20 0610 12/06/20 0117 12/07/20 0550  ALBUMIN 2.1* 2.1* 2.1* 2.1* 2.0*   No results for input(s): LIPASE, AMYLASE in the last 168 hours. No results for input(s): AMMONIA in the last 168 hours. CBC: Recent Labs  Lab 11/30/20 1248 12/04/20 0819  12/06/20 0117 12/07/20 0550  WBC 16.4* 16.6* 13.7* 17.2*  NEUTROABS  --   --  9.9* 12.1*  HGB 8.3* 7.5* 7.7* 7.4*  HCT 27.2* 24.2* 26.2* 25.2*  MCV 85.5 84.9 87.6 86.9  PLT 369 417* 355 395   Cardiac Enzymes: No results for input(s): CKTOTAL, CKMB, CKMBINDEX, TROPONINI in the last 168 hours. BNP (last 3 results) Recent Labs    09/07/20 0118  09/08/20 0446 09/09/20 0428  BNP 216.8* 432.5* 609.7*    ProBNP (last 3 results) No results for input(s): PROBNP in the last 8760 hours.  CBG: Recent Labs  Lab 12/06/20 0809 12/06/20 1236 12/06/20 1611 12/06/20 2128 12/06/20 2327  GLUCAP 115* 140* 220* 155* 139*    Recent Results (from the past 240 hour(s))  Culture, blood (routine x 2)     Status: None (Preliminary result)   Collection Time: 12/05/20 11:07 AM   Specimen: BLOOD RIGHT ARM  Result Value Ref Range Status   Specimen Description BLOOD RIGHT ARM  Final   Special Requests   Final    BOTTLES DRAWN AEROBIC ONLY Blood Culture adequate volume   Culture  Setup Time   Final    GRAM POSITIVE COCCI IN CLUSTERS AEROBIC BOTTLE ONLY Organism ID to follow CRITICAL RESULT CALLED TO, READ BACK BY AND VERIFIED WITH: Tillman Sers PHARMD 2000 12/06/20 A BROWNING Performed at [facility], [place] [address], [place], [place] [address]    Culture GRAM POSITIVE COCCI  Final   Report Status PENDING  Incomplete  Culture, blood (routine x 2)     Status: None (Preliminary result)   Collection Time: 12/05/20 11:07 AM   Specimen: BLOOD LEFT HAND  Result Value Ref Range Status   Specimen Description BLOOD LEFT HAND  Final   Special Requests   Final    BOTTLES DRAWN AEROBIC ONLY Blood Culture adequate volume   Culture   Final    NO GROWTH < 24 HOURS Performed at [facility], [place] [address] [place], [place] [address]    Report Status PENDING  Incomplete  Blood Culture ID Panel (Reflexed)     Status: Abnormal   Collection Time: 12/05/20 11:07 AM  Result Value Ref Range Status    Enterococcus faecalis NOT DETECTED NOT DETECTED Final   Enterococcus Faecium NOT DETECTED NOT DETECTED Final   Listeria monocytogenes NOT DETECTED NOT DETECTED Final   Staphylococcus species DETECTED (A) NOT DETECTED Final    Comment: CRITICAL RESULT CALLED TO, READ BACK BY AND VERIFIED WITH: Tillman Sers PHARMD 2000 12/06/20 A BROWNING    Staphylococcus aureus (BCID) NOT DETECTED NOT DETECTED Final   Staphylococcus epidermidis DETECTED (A) NOT DETECTED Final    Comment: Methicillin (oxacillin) resistant coagulase negative staphylococcus. Possible blood culture contaminant (unless isolated from more than one blood culture draw or clinical case suggests pathogenicity). No antibiotic treatment is indicated for blood  culture contaminants. CRITICAL RESULT CALLED TO, READ BACK BY AND VERIFIED WITH: Tillman Sers PHARMD 2000 12/06/20 A BROWNING    Staphylococcus lugdunensis NOT DETECTED NOT DETECTED Final   Streptococcus species NOT DETECTED NOT DETECTED Final   Streptococcus agalactiae NOT DETECTED NOT DETECTED Final   Streptococcus pneumoniae NOT DETECTED NOT DETECTED Final   Streptococcus pyogenes NOT DETECTED NOT DETECTED Final   A.calcoaceticus-baumannii NOT DETECTED NOT DETECTED Final   Bacteroides fragilis NOT DETECTED NOT DETECTED Final   Enterobacterales NOT DETECTED NOT DETECTED Final   Enterobacter cloacae complex NOT DETECTED NOT DETECTED Final   Escherichia coli NOT DETECTED NOT DETECTED Final   Klebsiella aerogenes NOT DETECTED NOT DETECTED Final   Klebsiella oxytoca NOT DETECTED NOT DETECTED Final   Klebsiella pneumoniae NOT DETECTED NOT DETECTED Final   Proteus species NOT DETECTED NOT DETECTED Final   Salmonella species NOT DETECTED NOT DETECTED Final   Serratia marcescens NOT DETECTED NOT DETECTED Final   Haemophilus influenzae NOT DETECTED NOT DETECTED Final   Neisseria meningitidis NOT  DETECTED NOT DETECTED Final   Pseudomonas aeruginosa NOT DETECTED NOT DETECTED Final    Stenotrophomonas maltophilia NOT DETECTED NOT DETECTED Final   Candida albicans NOT DETECTED NOT DETECTED Final   Candida auris NOT DETECTED NOT DETECTED Final   Candida glabrata NOT DETECTED NOT DETECTED Final   Candida krusei NOT DETECTED NOT DETECTED Final   Candida parapsilosis NOT DETECTED NOT DETECTED Final   Candida tropicalis NOT DETECTED NOT DETECTED Final   Cryptococcus neoformans/gattii NOT DETECTED NOT DETECTED Final   Methicillin resistance mecA/C DETECTED (A) NOT DETECTED Final    Comment: CRITICAL RESULT CALLED TO, READ BACK BY AND VERIFIED WITH: Tillman Sers PHARMD 2000 12/06/20 A BROWNING Performed at [place] [address], [place], [place] [address]      Studies: No results found.  Scheduled Meds: . acetaminophen (TYLENOL) oral liquid 160 mg/5 mL  650 mg Oral Q6H  . apixaban  2.5 mg Oral BID  . chlorhexidine gluconate (MEDLINE KIT)  15 mL Mouth Rinse BID  . Chlorhexidine Gluconate Cloth  6 each Topical Daily  . Chlorhexidine Gluconate Cloth  6 each Topical Q0600  . cholecalciferol  2,000 Units Oral Daily  . collagenase   Topical BID  . darbepoetin (ARANESP) injection - DIALYSIS  100 mcg Intravenous Q Sat-HD  . feeding supplement (NEPRO CARB STEADY)  237 mL Oral TID BM  . feeding supplement (PROSource TF)  45 mL Per Tube TID  . guaiFENesin  15 mL Oral Q12H  . insulin aspart  0-6 Units Subcutaneous TID WC  . mouth rinse  15 mL Mouth Rinse QID  . megestrol  200 mg Oral QID  . metoprolol tartrate  50 mg Oral BID  . midodrine  40 mg Oral 3 times per day on Mon Wed Fri  . multivitamin  1 tablet Oral QHS  . nutrition supplement (JUVEN)  1 packet Per Tube BID BM  . pantoprazole sodium  40 mg Oral BID  . sodium chloride flush  10-40 mL Intracatheter Q12H   Continuous Infusions: . albumin human 25 g (11/16/20 1516)  . feeding supplement (NEPRO CARB STEADY)    . vancomycin      Principal Problem:   Atrial fibrillation with RVR (HCC) Active Problems:    Acute respiratory failure due to COVID-19 [facility])   New onset a-fib (HCC)   Leukocytosis   Atrial fibrillation with rapid ventricular response (HCC)   Hypoxia   Pressure injury of skin   Acute hypoxemic respiratory failure (HCC)   ARDS (adult respiratory distress syndrome) (HCC)   Perforated duodenal ulcer ([place])   On mechanically assisted ventilation ([place])   Palliative care by specialist   Goals of care, counseling/discussion   Shock ([place])   Acute and chronic respiratory failure (acute-on-chronic) ([place])   Status post tracheostomy ([place])   ESRD (end stage renal disease) ([place])   HAP (hospital-acquired pneumonia)   Consultants:  Cardiology  Surgery  Nephrology  Ethics  PCCM   Procedures: R PICC 11/5 >> A line 11/9 >> out ETT 11/9 > 11/16, 11/16 >> 09/21/2020, 09/21/2020 tracheostomy>> [facility] [place] [facility] 11/9 >> R IJ trialysis >> out HD catheter 12/1 >>12/20 12/21 - 14.5 Fr, 23 cm right IJ tunneled hemodialysis catheter placement. Removal of indwelling subclavian catheter.   Antibiotics: Anti-infectives (From admission, onward)   Start     Dose/Rate Route Frequency Ordered Stop   12/07/20 1200  vancomycin (VANCOCIN) IVPB 1000 mg/200 mL premix        1,000 mg  200 mL/hr over 60 Minutes Intravenous Every T-Th-Sa (Hemodialysis) 12/06/20 2156     12/06/20 2245  vancomycin (VANCOREADY) IVPB 2000 mg/400 mL        2,000 mg 200 mL/hr over 120 Minutes Intravenous  Once 12/06/20 2156 12/07/20 0211   11/27/20 2000  ceFAZolin (ANCEF) IVPB 1 g/50 mL premix        1 g 100 mL/hr over 30 Minutes Intravenous Every 24 hours 11/26/20 1248 12/03/20 2136   11/26/20 1345  ceFAZolin (ANCEF) IVPB 1 g/50 mL premix        1 g 100 mL/hr over 30 Minutes Intravenous  Once 11/26/20 1248 11/26/20 1433   11/17/20 1415  fluconazole (DIFLUCAN) 40 MG/ML suspension 200 mg        200 mg Oral  Once 11/17/20 1315 11/17/20 1357   11/03/20 2200  meropenem (MERREM) 500 mg in sodium chloride 0.9 % 100 mL IVPB         500 mg 200 mL/hr over 30 Minutes Intravenous Every 24 hours 11/03/20 1500 11/06/20 2217   11/01/20 1000  anidulafungin (ERAXIS) 100 mg in sodium chloride 0.9 % 100 mL IVPB  Status:  Discontinued       "Followed by" Linked Group Details   100 mg 78 mL/hr over 100 Minutes Intravenous Every 24 hours 10/31/20 0916 11/01/20 1404   10/31/20 1015  meropenem (MERREM) 1 g in sodium chloride 0.9 % 100 mL IVPB  Status:  Discontinued        1 g 200 mL/hr over 30 Minutes Intravenous Every 8 hours 10/31/20 0916 11/03/20 1500   10/31/20 1015  linezolid (ZYVOX) IVPB 600 mg  Status:  Discontinued        600 mg 300 mL/hr over 60 Minutes Intravenous Every 12 hours 10/31/20 0916 11/02/20 0906   10/31/20 1015  anidulafungin (ERAXIS) 200 mg in sodium chloride 0.9 % 200 mL IVPB       "Followed by" Linked Group Details   200 mg 78 mL/hr over 200 Minutes Intravenous  Once 10/31/20 0916 10/31/20 1630   10/25/20 1800  ceFAZolin (ANCEF) IVPB 2g/100 mL premix  Status:  Discontinued        2 g 200 mL/hr over 30 Minutes Intravenous Every 12 hours 10/25/20 1022 10/30/20 1103   10/23/20 2000  vancomycin (VANCOREADY) IVPB 750 mg/150 mL  Status:  Discontinued        750 mg 150 mL/hr over 60 Minutes Intravenous Every 24 hours 10/22/20 2036 10/25/20 1022   10/23/20 1800  piperacillin-tazobactam (ZOSYN) IVPB 3.375 g  Status:  Discontinued        3.375 g 100 mL/hr over 30 Minutes Intravenous Every 6 hours 10/23/20 1155 10/24/20 1422   10/23/20 0200  piperacillin-tazobactam (ZOSYN) IVPB 3.375 g  Status:  Discontinued        3.375 g 100 mL/hr over 30 Minutes Intravenous Every 8 hours 10/22/20 2036 10/23/20 1155   10/22/20 1630  vancomycin (VANCOREADY) IVPB 1500 mg/300 mL        1,500 mg 150 mL/hr over 120 Minutes Intravenous  Once 10/22/20 1537 10/22/20 2229   10/22/20 1630  piperacillin-tazobactam (ZOSYN) IVPB 2.25 g  Status:  Discontinued        2.25 g 100 mL/hr over 30 Minutes Intravenous Every 8 hours 10/22/20 1537  10/22/20 2036   10/22/20 1537  vancomycin variable dose per unstable renal function (pharmacist dosing)  Status:  Discontinued         Does not apply See admin instructions 10/22/20  1537 10/22/20 2036   10/19/20 0600  ceFAZolin (ANCEF) IVPB 2g/100 mL premix        2 g 200 mL/hr over 30 Minutes Intravenous To Radiology 10/18/20 1457 10/19/20 0938   10/10/20 0830  vancomycin (VANCOCIN) IVPB 1000 mg/200 mL premix       "Followed by" Linked Group Details   1,000 mg 200 mL/hr over 60 Minutes Intravenous Every 24 hours 10/09/20 0744 10/15/20 0902   10/09/20 0830  piperacillin-tazobactam (ZOSYN) IVPB 3.375 g        3.375 g 100 mL/hr over 30 Minutes Intravenous Every 6 hours 10/09/20 0744 10/16/20 0038   10/09/20 0830  vancomycin (VANCOREADY) IVPB 2000 mg/400 mL       "Followed by" Linked Group Details   2,000 mg 200 mL/hr over 120 Minutes Intravenous  Once 10/09/20 0744 10/09/20 1022   09/24/20 1000  ceFAZolin (ANCEF) IVPB 2g/100 mL premix  Status:  Discontinued        2 g 200 mL/hr over 30 Minutes Intravenous Every 12 hours 09/24/20 0801 09/24/20 1046   09/23/20 1400  vancomycin (VANCOCIN) IVPB 1000 mg/200 mL premix        1,000 mg 200 mL/hr over 60 Minutes Intravenous Every 24 hours 09/22/20 1436 09/28/20 1718   09/22/20 2200  ceFEPIme (MAXIPIME) 2 g in sodium chloride 0.9 % 100 mL IVPB  Status:  Discontinued        2 g 200 mL/hr over 30 Minutes Intravenous Every 12 hours 09/22/20 1436 09/24/20 0801   09/22/20 1030  ceFEPIme (MAXIPIME) 1 g in sodium chloride 0.9 % 100 mL IVPB        1 g 200 mL/hr over 30 Minutes Intravenous  Once 09/22/20 0934 09/22/20 1145   09/22/20 1015  vancomycin (VANCOCIN) IVPB 1000 mg/200 mL premix        1,000 mg 200 mL/hr over 60 Minutes Intravenous  Once 09/22/20 0934 09/22/20 1446   09/12/20 2200  ceFEPIme (MAXIPIME) 2 g in sodium chloride 0.9 % 100 mL IVPB        2 g 200 mL/hr over 30 Minutes Intravenous Every 12 hours 09/12/20 0732 09/14/20 2134   09/11/20  1400  ceFEPIme (MAXIPIME) 2 g in sodium chloride 0.9 % 100 mL IVPB  Status:  Discontinued        2 g 200 mL/hr over 30 Minutes Intravenous Every 8 hours 09/11/20 1126 09/12/20 0732   09/08/20 1000  vancomycin (VANCOREADY) IVPB 2000 mg/400 mL        2,000 mg 200 mL/hr over 120 Minutes Intravenous  Once 09/08/20 0857 09/08/20 1224   09/08/20 1000  ceFEPIme (MAXIPIME) 2 g in sodium chloride 0.9 % 100 mL IVPB  Status:  Discontinued        2 g 200 mL/hr over 30 Minutes Intravenous Every 12 hours 09/08/20 0857 09/11/20 1126   09/08/20 0856  vancomycin variable dose per unstable renal function (pharmacist dosing)  Status:  Discontinued         Does not apply See admin instructions 09/08/20 0857 09/09/20 0935   09/02/20 1600  cefTRIAXone (ROCEPHIN) 1 g in sodium chloride 0.9 % 100 mL IVPB  Status:  Discontinued        1 g 200 mL/hr over 30 Minutes Intravenous Every 24 hours 09/01/20 1811 09/02/20 0838   09/01/20 1800  fluconazole (DIFLUCAN) IVPB 400 mg        400 mg 50 mL/hr over 240 Minutes Intravenous  Once 09/01/20 1749 09/02/20 0603  09/01/20 1530  piperacillin-tazobactam (ZOSYN) IVPB 3.375 g        3.375 g 12.5 mL/hr over 240 Minutes Intravenous Every 8 hours 09/01/20 1514 09/05/20 2111   09/01/20 1000  levofloxacin (LEVAQUIN) tablet 250 mg  Status:  Discontinued        250 mg Oral Daily 08/31/20 1508 08/31/20 1735   08/31/20 1730  cefTRIAXone (ROCEPHIN) 1 g in sodium chloride 0.9 % 100 mL IVPB  Status:  Discontinued        1 g 200 mL/hr over 30 Minutes Intravenous Every 24 hours 08/31/20 1726 09/01/20 1513   08/31/20 1730  azithromycin (ZITHROMAX) 500 mg in sodium chloride 0.9 % 250 mL IVPB  Status:  Discontinued        500 mg 250 mL/hr over 60 Minutes Intravenous Every 24 hours 08/31/20 1726 09/02/20 0838       Time spent: 30 minutes    [name] ANP  [facility] 7 am - 330 pm/M-F for direct patient care and secure chat Please refer to Amion for contact info 97   days

## 2020-12-07 NOTE — Progress Notes (Addendum)
CC: Rapid atrial fibrillation with SOB secondary to COVID PNA   69 YOF came in to the hospital 08/31/20 with rapid AF- admitted for hemmorrhagic shock. 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  2/8: Afebrile, WBC elevated. 1/4 bottles for S. epidermidis. Started on vancomycin overnight. S. epidermidis likely a contaminant.   Antibiotic change: STOP vancomycin; monitor off antibiotic therapy  Authorizing provider: [name], NP   Results for orders placed or performed during the hospital encounter of 08/31/20  MRSA PCR Screening     Status: None   Collection Time: 09/08/20  8:17 AM   Specimen: Nasal Mucosa; Nasopharyngeal  Result Value Ref Range Status   MRSA by PCR NEGATIVE NEGATIVE Final    Comment:        The GeneXpert MRSA Assay (FDA approved for NASAL specimens only), is one component of a comprehensive MRSA colonization surveillance program. It is not intended to diagnose MRSA infection nor to guide or monitor treatment for MRSA infections. Performed at [facility], [place] [address] [place], [place] [address]   Culture, blood (Routine X 2) w Reflex to ID Panel     Status: None   Collection Time: 09/08/20  8:35 AM   Specimen: BLOOD LEFT HAND  Result Value Ref Range Status   Specimen Description BLOOD LEFT HAND  Final   Special Requests   Final    BOTTLES DRAWN AEROBIC ONLY Blood Culture results may not be optimal due to an inadequate volume of blood received in culture bottles   Culture   Final    NO GROWTH 5 DAYS Performed at [facility], [place] [address], [place], [place] [address]    Report Status 09/13/2020 FINAL  Final  Culture, blood (Routine X 2) w Reflex to ID Panel     Status: None   Collection Time: 09/08/20  8:35 AM   Specimen: BLOOD RIGHT HAND  Result Value Ref Range Status   Specimen Description BLOOD  RIGHT HAND  Final   Special Requests   Final    BOTTLES DRAWN AEROBIC ONLY Blood Culture results may not be optimal due to an inadequate volume of blood received in culture bottles   Culture   Final    NO GROWTH 5 DAYS Performed at [facility], [place] [address] [employer], [place] [address]    Report Status 09/13/2020 FINAL  Final  Culture, respiratory (non-expectorated)     Status: None   Collection Time: 09/16/20  8:35 AM   Specimen: Tracheal Aspirate; Respiratory  Result Value Ref Range Status   Specimen Description TRACHEAL ASPIRATE  Final   Special Requests NONE  Final   Gram Stain   Final    ABUNDANT WBC PRESENT, PREDOMINANTLY PMN RARE YEAST    Culture   Final    FEW Normal respiratory flora-no Staph aureus or Pseudomonas seen Performed at [facility], [address] [place], [place] [address]    Report Status 09/18/2020 FINAL  Final  Culture, respiratory (non-expectorated)     Status: None   Collection Time: 09/16/20 11:03 AM   Specimen: Bronchoalveolar Lavage; Respiratory  Result Value Ref Range Status   Specimen Description BRONCHIAL ALVEOLAR LAVAGE  Final   Special Requests NONE  Final   Gram Stain   Final    ABUNDANT WBC PRESENT, PREDOMINANTLY PMN RARE GRAM POSITIVE COCCI Performed  at [facility], [place] [address], [place], [place] [address]    Culture FEW CANDIDA ALBICANS  Final   Report Status 09/18/2020 FINAL  Final  Culture, respiratory (non-expectorated)     Status: None   Collection Time: 09/22/20  9:30 AM   Specimen: Tracheal Aspirate; Respiratory  Result Value Ref Range Status   Specimen Description TRACHEAL ASPIRATE  Final   Special Requests NONE  Final   Gram Stain   Final    RARE WBC PRESENT,BOTH PMN AND MONONUCLEAR RARE GRAM POSITIVE COCCI IN PAIRS RARE GRAM VARIABLE ROD Performed at [facility], [place] [address] [place], [place] [address]    Culture   Final    MODERATE METHICILLIN RESISTANT STAPHYLOCOCCUS AUREUS   Report  Status 09/24/2020 FINAL  Final   Organism ID, Bacteria METHICILLIN RESISTANT STAPHYLOCOCCUS AUREUS  Final      Susceptibility   Methicillin resistant staphylococcus aureus - MIC*    CIPROFLOXACIN <=0.5 SENSITIVE Sensitive     ERYTHROMYCIN <=0.25 SENSITIVE Sensitive     GENTAMICIN <=0.5 SENSITIVE Sensitive     OXACILLIN RESISTANT Resistant     TETRACYCLINE <=1 SENSITIVE Sensitive     VANCOMYCIN 1 SENSITIVE Sensitive     TRIMETH/SULFA <=10 SENSITIVE Sensitive     CLINDAMYCIN 4 RESISTANT Resistant     RIFAMPIN <=0.5 SENSITIVE Sensitive     Inducible Clindamycin NEGATIVE Sensitive     * MODERATE METHICILLIN RESISTANT STAPHYLOCOCCUS AUREUS  Culture, blood (Routine X 2) w Reflex to ID Panel     Status: Abnormal   Collection Time: 09/22/20 10:45 AM   Specimen: BLOOD LEFT HAND  Result Value Ref Range Status   Specimen Description BLOOD LEFT HAND  Final   Special Requests   Final    BOTTLES DRAWN AEROBIC ONLY Blood Culture results may not be optimal due to an inadequate volume of blood received in culture bottles   Culture  Setup Time   Final    GRAM POSITIVE COCCI IN CLUSTERS AEROBIC BOTTLE ONLY CRITICAL RESULT CALLED TO, READ BACK BY AND VERIFIED WITH: PHARMD J MILLEN AT 0911 09/25/20 BY L BENFIELD    Culture (A)  Final    STAPHYLOCOCCUS EPIDERMIDIS THE SIGNIFICANCE OF ISOLATING THIS ORGANISM FROM A SINGLE SET OF BLOOD CULTURES WHEN MULTIPLE SETS ARE DRAWN IS UNCERTAIN. PLEASE NOTIFY THE MICROBIOLOGY DEPARTMENT WITHIN ONE WEEK IF SPECIATION AND SENSITIVITIES ARE REQUIRED. Performed at [facility], [place] [address] [place], [place] [address]    Report Status 09/26/2020 FINAL  Final  Blood Culture ID Panel (Reflexed)     Status: Abnormal   Collection Time: 09/22/20 10:45 AM  Result Value Ref Range Status   Enterococcus faecalis NOT DETECTED NOT DETECTED Final   Enterococcus Faecium NOT DETECTED NOT DETECTED Final   Listeria monocytogenes NOT DETECTED NOT DETECTED Final    Staphylococcus species DETECTED (A) NOT DETECTED Final    Comment: CRITICAL RESULT CALLED TO, READ BACK BY AND VERIFIED WITH: PHARMD J MILLEN AT 0911 09/25/20 BY L BENFIELD    Staphylococcus aureus (BCID) NOT DETECTED NOT DETECTED Final   Staphylococcus epidermidis DETECTED (A) NOT DETECTED Final    Comment: Methicillin (oxacillin) resistant coagulase negative staphylococcus. Possible blood culture contaminant (unless isolated from more than one blood culture draw or clinical case suggests pathogenicity). No antibiotic treatment is indicated for blood  culture contaminants. CRITICAL RESULT CALLED TO, READ BACK BY AND VERIFIED WITH: PHARMD J MILLEN AT 0911 09/25/20 BY L BENFIELD    Staphylococcus  lugdunensis NOT DETECTED NOT DETECTED Final   Streptococcus species NOT DETECTED NOT DETECTED Final   Streptococcus agalactiae NOT DETECTED NOT DETECTED Final   Streptococcus pneumoniae NOT DETECTED NOT DETECTED Final   Streptococcus pyogenes NOT DETECTED NOT DETECTED Final   A.calcoaceticus-baumannii NOT DETECTED NOT DETECTED Final   Bacteroides fragilis NOT DETECTED NOT DETECTED Final   Enterobacterales NOT DETECTED NOT DETECTED Final   Enterobacter cloacae complex NOT DETECTED NOT DETECTED Final   Escherichia coli NOT DETECTED NOT DETECTED Final   Klebsiella aerogenes NOT DETECTED NOT DETECTED Final   Klebsiella oxytoca NOT DETECTED NOT DETECTED Final   Klebsiella pneumoniae NOT DETECTED NOT DETECTED Final   Proteus species NOT DETECTED NOT DETECTED Final   Salmonella species NOT DETECTED NOT DETECTED Final   Serratia marcescens NOT DETECTED NOT DETECTED Final   Haemophilus influenzae NOT DETECTED NOT DETECTED Final   Neisseria meningitidis NOT DETECTED NOT DETECTED Final   Pseudomonas aeruginosa NOT DETECTED NOT DETECTED Final   Stenotrophomonas maltophilia NOT DETECTED NOT DETECTED Final   Candida albicans NOT DETECTED NOT DETECTED Final   Candida auris NOT DETECTED NOT DETECTED Final    Candida glabrata NOT DETECTED NOT DETECTED Final   Candida krusei NOT DETECTED NOT DETECTED Final   Candida parapsilosis NOT DETECTED NOT DETECTED Final   Candida tropicalis NOT DETECTED NOT DETECTED Final   Cryptococcus neoformans/gattii NOT DETECTED NOT DETECTED Final   Methicillin resistance mecA/C DETECTED (A) NOT DETECTED Final    Comment: CRITICAL RESULT CALLED TO, READ BACK BY AND VERIFIED WITH: PHARMD J MILLEN AT 0911 09/25/20 BY L BENFIELD Performed at [facility], [address] [place], [place] [address]   Culture, blood (Routine X 2) w Reflex to ID Panel     Status: None   Collection Time: 09/22/20 10:52 AM   Specimen: BLOOD LEFT HAND  Result Value Ref Range Status   Specimen Description BLOOD LEFT HAND  Final   Special Requests   Final    BOTTLES DRAWN AEROBIC ONLY Blood Culture results may not be optimal due to an inadequate volume of blood received in culture bottles   Culture   Final    NO GROWTH 5 DAYS Performed at [facility], [place] [address], [place], [place] [address]    Report Status 09/27/2020 FINAL  Final  Culture, blood (Routine X 2) w Reflex to ID Panel     Status: Abnormal   Collection Time: 09/29/20  5:51 PM   Specimen: BLOOD LEFT HAND  Result Value Ref Range Status   Specimen Description BLOOD LEFT HAND  Final   Special Requests   Final    BOTTLES DRAWN AEROBIC ONLY Blood Culture results may not be optimal due to an inadequate volume of blood received in culture bottles   Culture  Setup Time   Final    GRAM POSITIVE COCCI IN CLUSTERS AEROBIC BOTTLE ONLY Organism ID to follow CRITICAL RESULT CALLED TO, READ BACK BY AND VERIFIED WITH: A. [facility] PharmD 11:45 10/01/20 ([email or web address])    Culture (A)  Final    STAPHYLOCOCCUS EPIDERMIDIS THE SIGNIFICANCE OF ISOLATING THIS ORGANISM FROM A SINGLE SET OF BLOOD CULTURES WHEN MULTIPLE SETS ARE DRAWN IS UNCERTAIN. PLEASE NOTIFY THE MICROBIOLOGY DEPARTMENT WITHIN ONE WEEK IF SPECIATION AND SENSITIVITIES ARE  REQUIRED. Performed at [facility], [place] [address], [place], [place] [address]    Report Status 10/03/2020 FINAL  Final  Blood Culture ID Panel (Reflexed)     Status: Abnormal  Collection Time: 09/29/20  5:51 PM  Result Value Ref Range Status   Enterococcus faecalis NOT DETECTED NOT DETECTED Final   Enterococcus Faecium NOT DETECTED NOT DETECTED Final   Listeria monocytogenes NOT DETECTED NOT DETECTED Final   Staphylococcus species DETECTED (A) NOT DETECTED Final    Comment: CRITICAL RESULT CALLED TO, READ BACK BY AND VERIFIED WITH: A. [facility] PharmD 11:45 10/01/20 ([email or web address])    Staphylococcus aureus (BCID) NOT DETECTED NOT DETECTED Final   Staphylococcus epidermidis DETECTED (A) NOT DETECTED Final    Comment: Methicillin (oxacillin) resistant coagulase negative staphylococcus. Possible blood culture contaminant (unless isolated from more than one blood culture draw or clinical case suggests pathogenicity). No antibiotic treatment is indicated for blood  culture contaminants. CRITICAL RESULT CALLED TO, READ BACK BY AND VERIFIED WITH: A. [facility] PharmD 11:45 10/01/20 ([email or web address])    Staphylococcus lugdunensis NOT DETECTED NOT DETECTED Final   Streptococcus species NOT DETECTED NOT DETECTED Final   Streptococcus agalactiae NOT DETECTED NOT DETECTED Final   Streptococcus pneumoniae NOT DETECTED NOT DETECTED Final   Streptococcus pyogenes NOT DETECTED NOT DETECTED Final   A.calcoaceticus-baumannii NOT DETECTED NOT DETECTED Final   Bacteroides fragilis NOT DETECTED NOT DETECTED Final   Enterobacterales NOT DETECTED NOT DETECTED Final   Enterobacter cloacae complex NOT DETECTED NOT DETECTED Final   Escherichia coli NOT DETECTED NOT DETECTED Final   Klebsiella aerogenes NOT DETECTED NOT DETECTED Final   Klebsiella oxytoca NOT DETECTED NOT DETECTED Final   Klebsiella pneumoniae NOT DETECTED NOT DETECTED Final   Proteus species NOT DETECTED NOT DETECTED Final   Salmonella species NOT DETECTED  NOT DETECTED Final   Serratia marcescens NOT DETECTED NOT DETECTED Final   Haemophilus influenzae NOT DETECTED NOT DETECTED Final   Neisseria meningitidis NOT DETECTED NOT DETECTED Final   Pseudomonas aeruginosa NOT DETECTED NOT DETECTED Final   Stenotrophomonas maltophilia NOT DETECTED NOT DETECTED Final   Candida albicans NOT DETECTED NOT DETECTED Final   Candida auris NOT DETECTED NOT DETECTED Final   Candida glabrata NOT DETECTED NOT DETECTED Final   Candida krusei NOT DETECTED NOT DETECTED Final   Candida parapsilosis NOT DETECTED NOT DETECTED Final   Candida tropicalis NOT DETECTED NOT DETECTED Final   Cryptococcus neoformans/gattii NOT DETECTED NOT DETECTED Final   Methicillin resistance mecA/C DETECTED (A) NOT DETECTED Final    Comment: CRITICAL RESULT CALLED TO, READ BACK BY AND VERIFIED WITH: A. [facility] PharmD 11:45 10/01/20 ([email or web address]) Performed at [facility], [place] [address], [place], [place] [address]   Culture, blood (Routine X 2) w Reflex to ID Panel     Status: None   Collection Time: 09/29/20  6:22 PM   Specimen: BLOOD  Result Value Ref Range Status   Specimen Description BLOOD LEFT FOOT  Final   Special Requests   Final    BOTTLES DRAWN AEROBIC ONLY Blood Culture results may not be optimal due to an inadequate volume of blood received in culture bottles   Culture   Final    NO GROWTH 5 DAYS Performed at [facility], [place] [address], [place], [place] [address]    Report Status 10/04/2020 FINAL  Final  Culture, blood (routine x 2)     Status: None   Collection Time: 10/10/20 11:11 AM   Specimen: BLOOD  Result Value Ref Range Status   Specimen Description BLOOD BLOOD LEFT HAND  Final   Special Requests   Final    BOTTLES DRAWN AEROBIC AND ANAEROBIC Blood Culture adequate volume  Culture   Final    NO GROWTH 5 DAYS Performed at [facility], [place] [address], [place], [place] [address]    Report Status 10/15/2020 FINAL  Final  Culture, blood  (routine x 2)     Status: None   Collection Time: 10/10/20 11:11 AM   Specimen: BLOOD  Result Value Ref Range Status   Specimen Description BLOOD LEFT ANTECUBITAL  Final   Special Requests   Final    BOTTLES DRAWN AEROBIC ONLY Blood Culture results may not be optimal due to an inadequate volume of blood received in culture bottles   Culture   Final    NO GROWTH 5 DAYS Performed at [facility], [place] [address] [place], [place] [address]    Report Status 10/15/2020 FINAL  Final  Culture, respiratory (non-expectorated)     Status: None   Collection Time: 10/22/20  6:14 PM   Specimen: Tracheal Aspirate; Respiratory  Result Value Ref Range Status   Specimen Description TRACHEAL ASPIRATE  Final   Special Requests NONE  Final   Gram Stain   Final    ABUNDANT WBC PRESENT, PREDOMINANTLY PMN ABUNDANT GRAM POSITIVE COCCI Performed at [facility], [place] [address], [place], [place] [address]    Culture FEW STAPHYLOCOCCUS AUREUS  Final   Report Status 10/25/2020 FINAL  Final   Organism ID, Bacteria STAPHYLOCOCCUS AUREUS  Final      Susceptibility   Staphylococcus aureus - MIC*    CIPROFLOXACIN <=0.5 SENSITIVE Sensitive     ERYTHROMYCIN <=0.25 SENSITIVE Sensitive     GENTAMICIN <=0.5 SENSITIVE Sensitive     OXACILLIN <=0.25 SENSITIVE Sensitive     TETRACYCLINE <=1 SENSITIVE Sensitive     VANCOMYCIN 1 SENSITIVE Sensitive     TRIMETH/SULFA <=10 SENSITIVE Sensitive     CLINDAMYCIN <=0.25 SENSITIVE Sensitive     RIFAMPIN <=0.5 SENSITIVE Sensitive     Inducible Clindamycin NEGATIVE Sensitive     * FEW STAPHYLOCOCCUS AUREUS  Culture, blood (Routine X 2) w Reflex to ID Panel     Status: None   Collection Time: 10/23/20  2:53 AM   Specimen: BLOOD LEFT HAND  Result Value Ref Range Status   Specimen Description BLOOD LEFT HAND  Final   Special Requests   Final    BOTTLES DRAWN AEROBIC AND ANAEROBIC Blood Culture results may not be optimal due to an inadequate volume of blood received  in culture bottles   Culture   Final    NO GROWTH 5 DAYS Performed at [facility], [place] [address], [place], [place] [address]    Report Status 10/28/2020 FINAL  Final  Culture, blood (Routine X 2) w Reflex to ID Panel     Status: None   Collection Time: 10/23/20  3:01 AM   Specimen: BLOOD LEFT FOREARM  Result Value Ref Range Status   Specimen Description BLOOD LEFT FOREARM  Final   Special Requests   Final    AEROBIC BOTTLE ONLY Blood Culture results may not be optimal due to an inadequate volume of blood received in culture bottles   Culture   Final    NO GROWTH 5 DAYS Performed at [facility], [place] [address] [place], [place] [address]    Report Status 10/28/2020 FINAL  Final  Culture, blood (Routine X 2) w Reflex to ID Panel     Status: None   Collection Time: 10/31/20 10:13 AM   Specimen: BLOOD  Result Value Ref  Range Status   Specimen Description BLOOD PICC LINE  Final   Special Requests   Final    BOTTLES DRAWN AEROBIC AND ANAEROBIC Blood Culture adequate volume   Culture   Final    NO GROWTH 6 DAYS Performed at [facility], [address] [place], [place] [address]    Report Status 11/06/2020 FINAL  Final  Culture, blood (Routine X 2) w Reflex to ID Panel     Status: None   Collection Time: 10/31/20 10:18 AM   Specimen: BLOOD LEFT HAND  Result Value Ref Range Status   Specimen Description BLOOD LEFT HAND  Final   Special Requests   Final    BOTTLES DRAWN AEROBIC ONLY Blood Culture results may not be optimal due to an inadequate volume of blood received in culture bottles   Culture   Final    NO GROWTH 6 DAYS Performed at [facility], [place] [address] [place], [place] [address]    Report Status 11/06/2020 FINAL  Final  Culture, respiratory (non-expectorated)     Status: None   Collection Time: 11/02/20 10:11 AM   Specimen: Tracheal Aspirate; Respiratory  Result Value Ref Range Status   Specimen Description TRACHEAL ASPIRATE  Final    Special Requests NONE  Final   Gram Stain   Final    MODERATE WBC PRESENT,BOTH PMN AND MONONUCLEAR RARE GRAM POSITIVE COCCI IN PAIRS Performed at [facility], [place] [address] [place], [place] [address]    Culture RARE STAPHYLOCOCCUS AUREUS  Final   Report Status 11/05/2020 FINAL  Final   Organism ID, Bacteria STAPHYLOCOCCUS AUREUS  Final      Susceptibility   Staphylococcus aureus - MIC*    CIPROFLOXACIN <=0.5 SENSITIVE Sensitive     ERYTHROMYCIN <=0.25 SENSITIVE Sensitive     GENTAMICIN <=0.5 SENSITIVE Sensitive     OXACILLIN <=0.25 SENSITIVE Sensitive     TETRACYCLINE <=1 SENSITIVE Sensitive     VANCOMYCIN 1 SENSITIVE Sensitive     TRIMETH/SULFA <=10 SENSITIVE Sensitive     CLINDAMYCIN <=0.25 SENSITIVE Sensitive     RIFAMPIN <=0.5 SENSITIVE Sensitive     Inducible Clindamycin NEGATIVE Sensitive     * RARE STAPHYLOCOCCUS AUREUS  Culture, respiratory (non-expectorated)     Status: None   Collection Time: 11/22/20  4:45 PM   Specimen: Tracheal Aspirate; Respiratory  Result Value Ref Range Status   Specimen Description TRACHEAL ASPIRATE  Final   Special Requests NONE  Final   Gram Stain   Final    ABUNDANT WBC PRESENT,BOTH PMN AND MONONUCLEAR ABUNDANT GRAM POSITIVE COCCI IN PAIRS Performed at [facility], [place] [address] [place], [place] [address]    Culture MODERATE STAPHYLOCOCCUS AUREUS  Final   Report Status 11/25/2020 FINAL  Final   Organism ID, Bacteria STAPHYLOCOCCUS AUREUS  Final      Susceptibility   Staphylococcus aureus - MIC*    CIPROFLOXACIN <=0.5 SENSITIVE Sensitive     ERYTHROMYCIN <=0.25 SENSITIVE Sensitive     GENTAMICIN <=0.5 SENSITIVE Sensitive     OXACILLIN <=0.25 SENSITIVE Sensitive     TETRACYCLINE <=1 SENSITIVE Sensitive     VANCOMYCIN <=0.5 SENSITIVE Sensitive     TRIMETH/SULFA <=10 SENSITIVE Sensitive     CLINDAMYCIN <=0.25 SENSITIVE Sensitive     RIFAMPIN <=0.5 SENSITIVE Sensitive     Inducible Clindamycin NEGATIVE Sensitive     *  MODERATE STAPHYLOCOCCUS AUREUS  Culture, blood (routine x 2)     Status: Abnormal (  Preliminary result)   Collection Time: 12/05/20 11:07 AM   Specimen: BLOOD RIGHT ARM  Result Value Ref Range Status   Specimen Description BLOOD RIGHT ARM  Final   Special Requests   Final    BOTTLES DRAWN AEROBIC ONLY Blood Culture adequate volume   Culture  Setup Time   Final    GRAM POSITIVE COCCI IN CLUSTERS AEROBIC BOTTLE ONLY Organism ID to follow CRITICAL RESULT CALLED TO, READ BACK BY AND VERIFIED WITH: Tillman Sers PHARMD 2000 12/06/20 A BROWNING    Culture (A)  Final    STAPHYLOCOCCUS EPIDERMIDIS THE SIGNIFICANCE OF ISOLATING THIS ORGANISM FROM A SINGLE SET OF BLOOD CULTURES WHEN MULTIPLE SETS ARE DRAWN IS UNCERTAIN. PLEASE NOTIFY THE MICROBIOLOGY DEPARTMENT WITHIN ONE WEEK IF SPECIATION AND SENSITIVITIES ARE REQUIRED. Performed at [facility], [place] [address] [place], [place] [address]    Report Status PENDING  Incomplete  Culture, blood (routine x 2)     Status: None (Preliminary result)   Collection Time: 12/05/20 11:07 AM   Specimen: BLOOD LEFT HAND  Result Value Ref Range Status   Specimen Description BLOOD LEFT HAND  Final   Special Requests   Final    BOTTLES DRAWN AEROBIC ONLY Blood Culture adequate volume   Culture   Final    NO GROWTH 2 DAYS Performed at [facility], [place] [address] [place], [place] [address]    Report Status PENDING  Incomplete  Blood Culture ID Panel (Reflexed)     Status: Abnormal   Collection Time: 12/05/20 11:07 AM  Result Value Ref Range Status   Enterococcus faecalis NOT DETECTED NOT DETECTED Final   Enterococcus Faecium NOT DETECTED NOT DETECTED Final   Listeria monocytogenes NOT DETECTED NOT DETECTED Final   Staphylococcus species DETECTED (A) NOT DETECTED Final    Comment: CRITICAL RESULT CALLED TO, READ BACK BY AND VERIFIED WITH: Tillman Sers PHARMD 2000 12/06/20 A BROWNING    Staphylococcus aureus (BCID) NOT DETECTED NOT DETECTED Final    Staphylococcus epidermidis DETECTED (A) NOT DETECTED Final    Comment: Methicillin (oxacillin) resistant coagulase negative staphylococcus. Possible blood culture contaminant (unless isolated from more than one blood culture draw or clinical case suggests pathogenicity). No antibiotic treatment is indicated for blood  culture contaminants. CRITICAL RESULT CALLED TO, READ BACK BY AND VERIFIED WITH: Tillman Sers PHARMD 2000 12/06/20 A BROWNING    Staphylococcus lugdunensis NOT DETECTED NOT DETECTED Final   Streptococcus species NOT DETECTED NOT DETECTED Final   Streptococcus agalactiae NOT DETECTED NOT DETECTED Final   Streptococcus pneumoniae NOT DETECTED NOT DETECTED Final   Streptococcus pyogenes NOT DETECTED NOT DETECTED Final   A.calcoaceticus-baumannii NOT DETECTED NOT DETECTED Final   Bacteroides fragilis NOT DETECTED NOT DETECTED Final   Enterobacterales NOT DETECTED NOT DETECTED Final   Enterobacter cloacae complex NOT DETECTED NOT DETECTED Final   Escherichia coli NOT DETECTED NOT DETECTED Final   Klebsiella aerogenes NOT DETECTED NOT DETECTED Final   Klebsiella oxytoca NOT DETECTED NOT DETECTED Final   Klebsiella pneumoniae NOT DETECTED NOT DETECTED Final   Proteus species NOT DETECTED NOT DETECTED Final   Salmonella species NOT DETECTED NOT DETECTED Final   Serratia marcescens NOT DETECTED NOT DETECTED Final   Haemophilus influenzae NOT DETECTED NOT DETECTED Final   Neisseria meningitidis NOT DETECTED NOT DETECTED Final   Pseudomonas aeruginosa NOT DETECTED NOT DETECTED Final   Stenotrophomonas maltophilia NOT DETECTED NOT DETECTED Final   Candida albicans NOT DETECTED NOT DETECTED Final   Candida  auris NOT DETECTED NOT DETECTED Final   Candida glabrata NOT DETECTED NOT DETECTED Final   Candida krusei NOT DETECTED NOT DETECTED Final   Candida parapsilosis NOT DETECTED NOT DETECTED Final   Candida tropicalis NOT DETECTED NOT DETECTED Final   Cryptococcus neoformans/gattii NOT  DETECTED NOT DETECTED Final   Methicillin resistance mecA/C DETECTED (A) NOT DETECTED Final    Comment: CRITICAL RESULT CALLED TO, READ BACK BY AND VERIFIED WITH: Tillman Sers PHARMD 2000 12/06/20 A BROWNING Performed at [place] [address], [place], [place] [address]     Adria Dill, PharmD-Candidate  12/07/20 [facility]

## 2020-12-08 ENCOUNTER — Inpatient Hospital Stay (HOSPITAL_COMMUNITY): Payer: Medicare Other

## 2020-12-08 DIAGNOSIS — I4891 Unspecified atrial fibrillation: Secondary | ICD-10-CM | POA: Diagnosis not present

## 2020-12-08 LAB — CBC WITH DIFFERENTIAL/PLATELET
Abs Immature Granulocytes: 0.14 10*3/uL — ABNORMAL HIGH (ref 0.00–0.07)
Basophils Absolute: 0.1 10*3/uL (ref 0.0–0.1)
Basophils Relative: 0 %
Eosinophils Absolute: 0.3 10*3/uL (ref 0.0–0.5)
Eosinophils Relative: 2 %
HCT: 24.8 % — ABNORMAL LOW (ref 36.0–46.0)
Hemoglobin: 7.8 g/dL — ABNORMAL LOW (ref 12.0–15.0)
Immature Granulocytes: 1 %
Lymphocytes Relative: 16 %
Lymphs Abs: 2.8 10*3/uL (ref 0.7–4.0)
MCH: 27.1 pg (ref 26.0–34.0)
MCHC: 31.5 g/dL (ref 30.0–36.0)
MCV: 86.1 fL (ref 80.0–100.0)
Monocytes Absolute: 1.3 10*3/uL — ABNORMAL HIGH (ref 0.1–1.0)
Monocytes Relative: 8 %
Neutro Abs: 12.9 10*3/uL — ABNORMAL HIGH (ref 1.7–7.7)
Neutrophils Relative %: 73 %
Platelets: 393 10*3/uL (ref 150–400)
RBC: 2.88 MIL/uL — ABNORMAL LOW (ref 3.87–5.11)
RDW: 17.8 % — ABNORMAL HIGH (ref 11.5–15.5)
WBC: 17.5 10*3/uL — ABNORMAL HIGH (ref 4.0–10.5)
nRBC: 0.7 % — ABNORMAL HIGH (ref 0.0–0.2)

## 2020-12-08 LAB — RENAL FUNCTION PANEL
Albumin: 2.2 g/dL — ABNORMAL LOW (ref 3.5–5.0)
Anion gap: 12 (ref 5–15)
BUN: 23 mg/dL (ref 8–23)
CO2: 23 mmol/L (ref 22–32)
Calcium: 8.8 mg/dL — ABNORMAL LOW (ref 8.9–10.3)
Chloride: 101 mmol/L (ref 98–111)
Creatinine, Ser: 0.95 mg/dL (ref 0.44–1.00)
GFR, Estimated: >60 mL/min (ref >60)
Glucose, Bld: 203 mg/dL — ABNORMAL HIGH (ref 70–99)
Phosphorus: 2.8 mg/dL (ref 2.5–4.6)
Potassium: 3.3 mmol/L — ABNORMAL LOW (ref 3.5–5.1)
Sodium: 136 mmol/L (ref 135–145)

## 2020-12-08 LAB — CULTURE, BLOOD (ROUTINE X 2): Special Requests: ADEQUATE

## 2020-12-08 LAB — GLUCOSE, CAPILLARY
Glucose-Capillary: 166 mg/dL — ABNORMAL HIGH (ref 70–99)
Glucose-Capillary: 193 mg/dL — ABNORMAL HIGH (ref 70–99)
Glucose-Capillary: 200 mg/dL — ABNORMAL HIGH (ref 70–99)
Glucose-Capillary: 205 mg/dL — ABNORMAL HIGH (ref 70–99)
Glucose-Capillary: 159 mg/dL — ABNORMAL HIGH (ref 70–99)

## 2020-12-08 MED ORDER — SILVER NITRATE-POT NITRATE 75-25 % EX MISC
1.0000 | CUTANEOUS | Status: DC | PRN
  Filled 2020-12-08: qty 1

## 2020-12-08 MED ORDER — CHLORHEXIDINE GLUCONATE CLOTH 2 % EX PADS
6.0000 | MEDICATED_PAD | Freq: Every day | CUTANEOUS | Status: DC
  Administered 2020-12-10: 6 via TOPICAL

## 2020-12-08 MED ORDER — NEPRO/CARBSTEADY PO LIQD
780.0000 mL | ORAL | Status: DC
  Administered 2020-12-08 – 2020-12-19 (×9): 780 mL
  Filled 2020-12-08 (×7): qty 948

## 2020-12-08 NOTE — Progress Notes (Signed)
Nutrition Follow-up  DOCUMENTATION CODES:   Not applicable  INTERVENTION:    ContinueNepro Shake poTID, each supplement provides 425 kcal and 19 grams protein  ContinueMagic cup TID with meals, each supplement provides 290 kcal and 9 grams of protein  Cater to pt preferences; daughter ordering meals for patient. Pt enjoys the tilapia and chicken salad. Pt also wanting to alternate between vanilla and strawberry/blueberry greek yogurt  Continue liberalized diet (no restrictions other than the nectar liquids); allow double portion of protein at meals  Continue Renal MVI  Continue appetite stimulant (currently on Megace)  Continue1 packet Juven BIDvia cortrak tube, each packet provides 95 calories, 2.5 grams of protein (collagen), and 9.8 grams of carbohydrate (3 grams sugar); also contains 7 grams of L-arginine and L-glutamine, 300 mg vitamin C, 15 mg vitamin E, 1.2 mcg vitamin B-12, 9.5 mg zinc, 200 mg calcium, and 1.5 g Calcium Beta-hydroxy-Beta-methylbutyrate to support wound healing  Continue nocturnal TF:   Nepro @ 70 ml/hr x 8 hours via cortrak tube (2200-0600)  45 ml Prosource TF TID  Regimen provides 11128 kcals, 78 grams protein, and 407 ml free water daily, meeting 56% of estimated kcal needs and 78% of estimated protein needs.   NUTRITION DIAGNOSIS:   Increased nutrient needs related to wound healing as evidenced by estimated needs.  Ongoing  GOAL:   Patient will meet greater than or equal to 90% of their needs  Progressing   MONITOR:   PO intake,Supplement acceptance,Labs,Weight trends,TF tolerance,Skin,I & O's  REASON FOR ASSESSMENT:   Consult New TPN/TNA  ASSESSMENT:   70 year old female patient with h/o diabetes, stage III chronic kidney disease, atrial fibrillation, hypertension, dyslipidemia and colon cancer s/p colectomy and colostomy who was admitted with COVID on 08/23/20 along with acute kidney injury and hypoxemic respiratory failure.   Pt was discharged home on 2 to 3 L of oxygen but returned to the ER on 08/31/2020 with atrial fibrillation with RVR and hemorrhagic shock r/t perforated duodenal ulcer now s/p exploratory laparotomy on 11/3 with adhesiolysis and modified graham patch.  Reviewed I/O's: -2.5 L x 24 hours and -9.1 L since 11/24/20  UOP: 500 ml x 24 hours  Nocturnal feedings resumed on 12/06/20; calorie count only meeting <60%.   Spoke with pt and daughter at bedside. Pt more alert and interactive at time of visit; smiling at this RD. Per daughter, intake continues to be a challenge and has decreased since TF re-started. Daughter shares that TF on 12/06/20 was administered as a bolus and resulted in large colostomy output. TF on 12/07/20 was not started until 0100 and was still infusing at time of visit; pt complains of being full, especially in the mornings.   Per pt daughter, pt has been on capping trials over the past two days and plan to re-evaluate for de-cannulation tomorrow. Pt daughter questioned if pt would be able to be advanced to thin liquids after decannulation. Daughter shares that thickened liquids are still a barrier to intake, as it limits food items (ex. She does not like the thickened milk when eating breakfast cereal). Deferred evaluation to SLP. Case discussed with SLP Berta Minor); she plans to re-evaluate tomorrow and states that pt's positioning in bed bed secondary to wound and pain is also limiting her ability to advance liquids.   Pt daughter asked multiple questions regarding possibility for diet advancement and need for tube feedings. RD reviewed calorie count results with her and appreciate her efforts. Discussed that while pt's intake has improved,  pt still needs additional nutritional support to support wound healing and debility. Noted meal completions 10-75% and per daughter, pt is only consuming about one Nepro supplement per day. Since oral intake has improved, daughter is agreeable to  recommendation to decrease timing of nocturnal feeds to see if that helps stimulate appetite. Discussed that there is still potential for need for permanent feeding access and what that could look like and progress to over time.   Communicate adjustment in TF changes to nurse via secure chat.   Medications reviewed and include vitamin D3 and aranesp.    Labs reviewed: K: 3.3, CBGS: [phone] (inpatient orders for glycemic control are 0-6 units insulin aspart TID with meals).   Diet Order:   Diet Order            Diet regular Room service appropriate? Yes; Fluid consistency: Nectar Thick  Diet effective now                 EDUCATION NEEDS:   Not appropriate for education at this time  Skin:  Skin Assessment: Skin Integrity Issues: Skin Integrity Issues:: Stage II DTI: sacrum Stage I: n/a Stage II: ear Incisions: abdomen  Last BM:  12/07/20 (150 ml via colostomy)  Height:   Ht Readings from Last 1 Encounters:  09/20/20 5\' 8"  (1.727 m)    Weight:   Wt Readings from Last 1 Encounters:  08/23/20 99.8 kg   BMI:  Body mass index is 31.04 kg/m.  Estimated Nutritional Needs:   Kcal:  2000-2300kcal/day  Protein:  100-120g/day  Fluid:  UOP +1L    Loistine Chance, RD, LDN, [place] Registered Dietitian II Certified Diabetes Care and Education Specialist Please refer to [facility] for RD and/or RD on-call/weekend/after hours pager

## 2020-12-08 NOTE — Progress Notes (Signed)
[facility] PROGRESS NOTE  Candace [name] JWJ:[id number] DOB: 03/09/1951 DOA: 08/31/2020 PCP: Algis Greenhouse, MD            Status: Remains inpatient appropriate because:Ongoing diagnostic testing needed not appropriate for outpatient work up, Unsafe d/c plan, IV treatments appropriate due to intensity of illness or inability to take PO and Inpatient level of care appropriate due to severity of illness   Dispo: The patient is from: Home              Anticipated d/c is to: Home with home health               Anticipated d/c date is: > 3 days              Patient currently is not medically stable to d/c.  Barriers to discharge: Continues to require HD- unable to sit in chair/get in [facility] for OP HD-also requiring Cortrak for nocturnal feeds; still has trach with copiuous secretions at times  Considerations for home discharge: 1. Patient will need to continue wound VAC and current wound VAC is apparently not available in the home setting 2. It is likely she will need a specialty bed.  We are currently transitioning back to the [facility] rotational bed which will allow for better in bed mobility including the ability to "stand" with PT in the bed without actually getting patient out of bed 3. Likely would need to be decannulated to minimize other care issues after discharge 4. Would need to be able to either mobilize for outpatient hemodialysis or be a candidate for home dialysis 5. Would not have aggressive PT OT available in the home setting that she can achieve during hospitalization 6. Patient will have family members that are CNAs managing her after discharge but this would be 24/7 requirement and this could be an overwhelming issue once patient is actually home  Code Status: Full Family Communication: 2/3 daughter [name] at bedside-extensive conversation held at bedside with daughter to include myself, Retail buyer, Editor, commissioning, and [facility] personnel regarding expectations  for IP care and dc planning DVT prophylaxis: Eliquis Vaccination status: Has not been vaccinated but did have severe COVID infection initially diagnosed on 08/23/2020.  Patient will be eligible for COVID-vaccine after 11/23/2020   Foley catheter: No, purewick female urinary collection device   HPI: 70 year old female patient with prior history of diabetes, stage III chronic kidney disease, atrial fibrillation, hypertension, dyslipidemia and colon cancer that is post colectomy and colostomy.  Initially diagnosed with COVID on 08/23/20 she was admitted to the hospital due to dehydration, acute kidney injury and hypoxemic respiratory failure.  She was discharged home on 2 to 3 L of oxygen after being treated with IV steroids and remdesivir.  During that time although she had elevated inflammatory markers she had no embolic or thrombotic disease.  She had been on prophylactic dose heparin during the hospitalization and was placed on Eliquis for 2 weeks for after discharge  Patient returned to the ER on 08/31/2020 for complaints of not feeling well.  She was found to be in atrial fibrillation with RVR, hemorrhagic shock and work-up revealed a perforated duodenal ulcer.  She underwent exploratory laparotomy on 11/3.  She has had an extensive ICU stay with significant events as below.   11/2 Admitted with rapid Afib 11/3 OR with findings of perforated duodenal ulcer 11/10 progressive hemorrhagic shock, intubated, transfused, pressors, proned; started on CRRT in PM 11/16 Extubated. Re-intubated overnight due to respiratory distress  and hypoxia with decreased mentation 11/18 Bronch, cultures sent 11/19 Hgb down getting blood 11/24 Spiked fever, resume empirical antimicrobial therapy 11/26 Hemorrhagic shock, hgb 5.6, increased pressors,  11/30 Per palliative "[name] understanding that patient is unfortunately very fragiledespite ongoing intensive medical treatment and full mechanical support.  [name] that the familywantsto continue with all current interventions despite potential outcomes". 12/08 CRRT discontinued due to clotting 12/09 Family requested transfer to tertiary care [facility]). [facility] denied transfer  12/10 CRRT restarted. Episodes of tachycardia, tachypnea that seem to improve with pain management 12/11 Back in shock. Pressor requirements up. CXR worse. ABX resumed 12/12 Still requiring inc pressors. Had hypoglycemic event. Basal insulin dosing adjusted 12/13 Pressor requirements better. Now hyperglycemic. Re-adjusted Glycemic control  12/19 Afebrile . Remains on dilaudid and heparin gtt, dilaudid gtt increased overnight for concern of pain / ongoing tachycardia, no other events . NE and precedex off 12/17. Ongoing CRRT- even UF, dosing lokelmia this morning 12/20 On CRRT. Renal plans for HD cath and moving to HD. Getting wound care 12/24 - renal stopping CRRT today and plans HD 10/24/20 . 40% fio2 on vent via Trach. TAchypenic and tachycardic. Afebrile but wbc up to 27.6K. On TF. On heparin gtt 12/25 - Back on CRRT. On vent via trach at 40% fio2, On scheduled dilaudid as add on to oxy. Per family request 12/24 - they felt scheduled oxy was not adequate and patient was showing signs of opioid withdrawal.  Patient also had worsening SIRS/sepsis syndrome. Had fever, rising wbc, worsening RR 40-60 and HR 140s sinus->started On abx yesterday. Fever some better today. WBC plateau at 28,.5K. On new levophed ->signifanct improvement in HR 77 and RR t0 20. On heparin gtt. On precedex gtt. On levophed gtt 49mcg wthi midodrine. On TF 12/30 Remains on CRRT with intermittent pressor requirements. Ethics consult placed 12/29. Ethics rec time trial of CRRT 12/31 failed SBT with RR 40s. Several conversations between care tam and family, who are upset RE plan of care 1/1 back on pressors  1/4: On pressors, keeping even on CVVHD, HGB drop to 6.9, transfused 1 unit. Improving mental  status 1/10 remains on low dose levophed 58mcg 1/11 off levo, attempting HD with UF for first time. Now tolerating intermittent HD. 1/19 patient transferred to progressive bed with tracheostomy on 35% FiO2.  has NG tube feeding.  No PEG tube.  Received dialysis on 1/18. 1/24 significant improvement and persistent tachycardia after introduction of twice daily beta-blocker.  Heart rates have improved from the 130s to the 98-104 range 1/24 core track clogged and therefore has been removed by nutrition team.  Calorie count in progress and we are weaning any sedating medications which could be contributing to patient's inability to eat. 1/27 continue w/ cuff #6 trach   Subjective: Slept well.  Did awaken during the night during staff care.  Still has concerns over being given pain medications without asking for them.  Daughter at bedside and questions answered.    Objective: Vitals:   12/08/20 0302 12/08/20 0351  BP: (!) 93/57   Pulse: 90 91  Resp: 20 20  Temp: 98.2 F (36.8 C)   SpO2: 100% 100%    Intake/Output Summary (Last 24 hours) at 12/08/2020 9518 Last data filed at 12/07/2020 1700 Gross per 24 hour  Intake --  Output 2500 ml  Net -2500 ml   Filed Weights    Exam: Constitutional: Awake, no acute distress, calm, interactive Respiratory: Lung sounds clear to auscultation anteriorly without increased work  of breathing.  FiO2 21%. #4 cuffed trach with PMV -no secretions noted on exam Cardiovascular: Normal heart sounds, pulse regular.  Heart rate in the 80s while sleeping and when awake and with activity averaging between 103 and 128 bpm Abdomen: On regular diet with nectar thick liquids- LBM 2/2-core track for nocturnal feedings.  Right colostomy with large amount of flatus with liquid stool Skin:  Massive sacral decubitus ulcer-wound VAC  Neurologic: CN 2-12 grossly intact. Sensation intact, DTR normal. Strength 1/5 x all 4 extremities.  Psychiatric: Awake, flat affect, oriented  x3   Assessment/Plan: Acute problems: PAF maintaining sinus rhythm w/persistent tachycardia -Continue metoprolol 50 mg twice daily.  Pulse well controlled -Echocardiogram this admission with an EF 50 to 55% with mild diastolic dysfunction parameters and normal RV systolic function -Continue Eliquis-benefits coordinator has determined that Eliquis is covered at [facility] [place] with a co-pay of $47  Acute respiratory failure secondary to COVID-pneumonia/tracheostomy/acute MSSA tracheobronchitis -Patient stable on low-flow oxygen FiO2 21% -PCCM/trach team following-change to #4 trach on 2/4 -Repeat respiratory culture on 1/24 with abundant staph with sensitivities pending -Completed IV Ancef for MSSA tracheobronchitis -Continue  suctioning and chest PT-Vibra vest -Continue PMV training   Coag neg staph-1/2 blood cultures positive -dw ID pharmacist- cultures c/w contamination Vancomycin ordered overnight stopped on 2/8  Acute kidney injury secondary to COVID-related sepsis with shock superimposed on stage III chronic kidney disease -Continue intermittent hemodialysis as directed by the renal team -Avoid nephrotoxic medications -Continue Aranesp on dialysis days -Patient is voiding and in the past 24 hours had 300 cc of urine out -BP improved therefore will change midodrine to use on dialysis days only 3 times daily  Lower extremity muscle pain -Continue Crea topically -Continue PROM and AROM -Family to bring in tennis shoes to wear in an alternating schedule to help improve foot flexion noting this improves her leg pain  Dysphagia/moderate to severe protein calorie malnutrition Nutrition Status: Nutrition Problem: Increased nutrient needs Etiology: wound healing Signs/Symptoms: estimated needs Interventions: Refer to RD note for recommendations  Estimated body mass index is 31.04 kg/m as calculated from the following:   Height as of this encounter: $RemoveBeforeD'5\' 8"'[id number]$  (1.727 m).   Weight as of  this encounter: 92.6 kg.  -2/3: Continue regular diet with nectar thick liquids -2/7 at recommendation of nutrition team resumed nocturnal tube feedings -Continue Megace -2/9 follow-up SLP evaluation pending  Acute hemorrhagic shock secondary to perforated duodenal ulcer/anemia of critical illness -Stable postoperatively -Hemoglobin remains stable greater than 8.0  Diabetes mellitus 2 -Patient has been transition to regular diet along with tube feedings and we have noted significant increase in CBG readings -Continue very sensitive SSI with meals -HgbA1c 08/24/2020 and was 7.0 -Prior to admission patient was on metformin XR-unable to use at this juncture due to severity of kidney disease -we are hopeful for recovery of renal function  Profound physical deconditioning/acute encephalopathy secondary to prolonged hospital stay/orthostasis -SNF recommended but family wants to take patient home -At this juncture patient unable to sit up in bed independently much less stand pivot and rotate to get into wheelchair and would be significant fall risk and care risk at home -In addition patient would need to be able to get in wheelchair to transport to dialysis if dialysis were to be continued in the outpatient setting -family refuses LTAC or SNF rehab due to pt prior request to NEVER be placed in any type of facility -Continue high-dose midodrine 3 times daily on dialysis days  only -Persistent tachycardia secondary to severe deconditioning has improved with the introduction of beta-blockers -Continue PROM every 4 hours-patient encouraged to perform independent AROM as well -2/3 order placed for repositioning every 2 hours -2/2: Successful out of bed to chair with initial short-term plan for 45 minutes to 1 hour today -FAMILY THAT WILL PROVIDE CARE FOR PT WILL NEED TO ASSIST WITH CARE OF THIS PT AT [place]  Stage IV sacral decubitus -Not present on admission -Wound care nurse  following and utilizing Santyl and hydrotherapy and now has wound VAC in place -Surgical team consulted.  Current recommendation is against pursuing general anesthesia to undergo extensive surgical procedure-hopefully can be reevaluated in the future if wound does not heal adequately.  As of 1/28 they have nothing further acutely to add but will be following peripherally. [facility] rotational bed  -Continue low-dose oxycodone for pain patient complaining of inadequate pain control at sacral decubitus.  -Order placed for pain assessment every 2 hours with request to document each assessment in the progress notes  -  11/24/2020 after [facility]      11/22/2020                       11/29/2020   12/06/20   Incision (Closed) 09/01/20 Abdomen (Active)  Date First Assessed/Time First Assessed: 09/01/20 1737   Location: Abdomen    Assessments 09/01/2020  6:25 PM 12/07/2020  8:13 AM  Dressing Type Gauze (Comment) Moist to dry  Dressing -- Dry;Clean;Intact  Dressing Change Frequency -- Daily  Site / Wound Assessment Dressing in place / Unable to assess --  Drainage Amount None --     No Linked orders to display     Pressure Injury 09/17/20 Ear Left;Anterior;Posterior Stage 2 -  Partial thickness loss of dermis presenting as a shallow open injury with a red, pink wound bed without slough. (Active)  Date First Assessed/Time First Assessed: 09/17/20 0800   Location: Ear  Location Orientation: Left;Anterior;Posterior  Staging: Stage 2 -  Partial thickness loss of dermis presenting as a shallow open injury with a red, pink wound bed without slough. ...    Assessments 09/17/2020  8:00 AM 12/05/2020  8:00 PM  Dressing Type Foam - Lift dressing to assess site every shift None  Dressing Clean;Dry;Intact --  Dressing Change Frequency Every 3 days --  Site / Wound Assessment Dry;Pink --  Peri-wound Assessment Intact --  Wound Length (cm) 2 cm --  Wound Width (cm) 1 cm --  Wound Depth (cm) 0.25 cm --  Wound Surface Area  (cm^2) 2 cm^2 --  Wound Volume (cm^3) 0.5 cm^3 --  Margins Unattached edges (unapproximated) --  Drainage Amount Scant --  Drainage Description Serosanguineous --  Treatment Cleansed --     No Linked orders to display     Pressure Injury 09/25/20 Sacrum Bilateral;Medial Deep Tissue Pressure Injury - Purple or maroon localized area of discolored intact skin or blood-filled blister due to damage of underlying soft tissue from pressure and/or shear. Purple, non-blanchable, b (Active)  Date First Assessed/Time First Assessed: 09/25/20 2000   Location: Sacrum  Location Orientation: Bilateral;Medial  Staging: Deep Tissue Pressure Injury - Purple or maroon localized area of discolored intact skin or blood-filled blister due to damage o...    Assessments 09/25/2020  5:00 PM 12/07/2020  8:13 AM  Dressing Type Foam - Lift dressing to assess site every shift Negative pressure wound therapy  Dressing Changed;Clean;Dry;Intact --  No Linked orders to display     Negative Pressure Wound Therapy Sacrum (Active)  Placement Date/Time: 11/22/20 1500   Wound Type: (c) Other (Comment)  Location: Sacrum    Assessments 11/22/2020  4:59 PM 12/07/2020  8:13 AM  Last dressing change 11/22/20 --  Site / Wound Assessment Granulation tissue;Pale;Yellow Dressing in place / Unable to assess  Peri-wound Assessment Intact Intact  Size see above --  Wound filler - Black foam 2 --  Wound filler - White foam 0 --  Wound filler - Nonadherent 0 --  Wound filler - Gauze 0 --  Cycle Continuous Continuous  Target Pressure (mmHg) 125 125  Instillation Volume 26 mL --  Instillation Solution Normal Saline --  Instillation Soak Time 10 minutes --  Instillation Therapy Time 3.5 hours --  Canister Changed No --  Dressing Status Intact --  Drainage Amount None --     No Linked orders to display     Other problems: History of stage IV colon cancer -Old colostomy  Acute encephalopathy with ICU delirium -Delirium has  resolved and supportive meds of Klonopin and Seroquel have been weaned and discontinued  Hypomagnesemia -Continue oral magnesium -1/21 give 4 g magnesium IV x1 -Follow labs  DVT bilateral posterior tibial veins -Was not present during initial COVID admission -Continue Eliquis for now given severe debility and increased risk for developing recurrent DVT  Hypertension -Having issues with orthostasis requiring midodrine Prior to admission patient was on Norvasc  Dyslipidemia -Prior to admission patient was on Crestor-consider resuming soon once patient can swallow pills whole  Abnormal TSH -Nov 2021 TSH was 0.015-TSH and free T4, T3 normal this admission  Data Reviewed: Basic Metabolic Panel: Recent Labs  Lab 12/04/20 0111 12/05/20 0610 12/06/20 0117 12/07/20 0550 12/08/20 0303  NA 137 138 136 136 136  K 3.3* 3.2* 3.4* 3.4* 3.3*  CL 107 105 107 106 101  CO2 19* 22 19* 19* 23  GLUCOSE 198* 131* 181* 147* 203*  BUN 73* 35* 35* 46* 23  CREATININE 1.24* 0.95 1.03* 0.90 0.95  CALCIUM 9.5 9.1 9.5 10.0 8.8*  PHOS 4.3 3.7 3.7 3.5 2.8   Liver Function Tests: Recent Labs  Lab 12/04/20 0111 12/05/20 0610 12/06/20 0117 12/07/20 0550 12/08/20 0303  ALBUMIN 2.1* 2.1* 2.1* 2.0* 2.2*   No results for input(s): LIPASE, AMYLASE in the last 168 hours. No results for input(s): AMMONIA in the last 168 hours. CBC: Recent Labs  Lab 12/04/20 0819 12/06/20 0117 12/07/20 0550 12/08/20 0303  WBC 16.6* 13.7* 17.2* 17.5*  NEUTROABS  --  9.9* 12.1* 12.9*  HGB 7.5* 7.7* 7.4* 7.8*  HCT 24.2* 26.2* 25.2* 24.8*  MCV 84.9 87.6 86.9 86.1  PLT 417* 355 395 393   Cardiac Enzymes: No results for input(s): CKTOTAL, CKMB, CKMBINDEX, TROPONINI in the last 168 hours. BNP (last 3 results) Recent Labs    09/07/20 0118 09/08/20 0446 09/09/20 0428  BNP 216.8* 432.5* 609.7*    ProBNP (last 3 results) No results for input(s): PROBNP in the last 8760 hours.  CBG: Recent Labs  Lab  12/07/20 0745 12/07/20 1537 12/07/20 1946 12/07/20 2343 12/08/20 0300  GLUCAP 118* 91 174* 114* 166*    Recent Results (from the past 240 hour(s))  Culture, blood (routine x 2)     Status: Abnormal (Preliminary result)   Collection Time: 12/05/20 11:07 AM   Specimen: BLOOD RIGHT ARM  Result Value Ref Range Status   Specimen Description BLOOD RIGHT ARM  Final  Special Requests   Final    BOTTLES DRAWN AEROBIC ONLY Blood Culture adequate volume   Culture  Setup Time   Final    GRAM POSITIVE COCCI IN CLUSTERS AEROBIC BOTTLE ONLY Organism ID to follow CRITICAL RESULT CALLED TO, READ BACK BY AND VERIFIED WITH: Tillman Sers PHARMD 2000 12/06/20 A BROWNING    Culture (A)  Final    STAPHYLOCOCCUS EPIDERMIDIS THE SIGNIFICANCE OF ISOLATING THIS ORGANISM FROM A SINGLE SET OF BLOOD CULTURES WHEN MULTIPLE SETS ARE DRAWN IS UNCERTAIN. PLEASE NOTIFY THE MICROBIOLOGY DEPARTMENT WITHIN ONE WEEK IF SPECIATION AND SENSITIVITIES ARE REQUIRED. Performed at [facility], [place] [address] [place], [place] [address]    Report Status PENDING  Incomplete  Culture, blood (routine x 2)     Status: None (Preliminary result)   Collection Time: 12/05/20 11:07 AM   Specimen: BLOOD LEFT HAND  Result Value Ref Range Status   Specimen Description BLOOD LEFT HAND  Final   Special Requests   Final    BOTTLES DRAWN AEROBIC ONLY Blood Culture adequate volume   Culture   Final    NO GROWTH 2 DAYS Performed at [facility], [place] [address] [place], [place] [address]    Report Status PENDING  Incomplete  Blood Culture ID Panel (Reflexed)     Status: Abnormal   Collection Time: 12/05/20 11:07 AM  Result Value Ref Range Status   Enterococcus faecalis NOT DETECTED NOT DETECTED Final   Enterococcus Faecium NOT DETECTED NOT DETECTED Final   Listeria monocytogenes NOT DETECTED NOT DETECTED Final   Staphylococcus species DETECTED (A) NOT DETECTED Final    Comment: CRITICAL RESULT CALLED TO, READ BACK BY AND  VERIFIED WITH: Tillman Sers PHARMD 2000 12/06/20 A BROWNING    Staphylococcus aureus (BCID) NOT DETECTED NOT DETECTED Final   Staphylococcus epidermidis DETECTED (A) NOT DETECTED Final    Comment: Methicillin (oxacillin) resistant coagulase negative staphylococcus. Possible blood culture contaminant (unless isolated from more than one blood culture draw or clinical case suggests pathogenicity). No antibiotic treatment is indicated for blood  culture contaminants. CRITICAL RESULT CALLED TO, READ BACK BY AND VERIFIED WITH: Tillman Sers PHARMD 2000 12/06/20 A BROWNING    Staphylococcus lugdunensis NOT DETECTED NOT DETECTED Final   Streptococcus species NOT DETECTED NOT DETECTED Final   Streptococcus agalactiae NOT DETECTED NOT DETECTED Final   Streptococcus pneumoniae NOT DETECTED NOT DETECTED Final   Streptococcus pyogenes NOT DETECTED NOT DETECTED Final   A.calcoaceticus-baumannii NOT DETECTED NOT DETECTED Final   Bacteroides fragilis NOT DETECTED NOT DETECTED Final   Enterobacterales NOT DETECTED NOT DETECTED Final   Enterobacter cloacae complex NOT DETECTED NOT DETECTED Final   Escherichia coli NOT DETECTED NOT DETECTED Final   Klebsiella aerogenes NOT DETECTED NOT DETECTED Final   Klebsiella oxytoca NOT DETECTED NOT DETECTED Final   Klebsiella pneumoniae NOT DETECTED NOT DETECTED Final   Proteus species NOT DETECTED NOT DETECTED Final   Salmonella species NOT DETECTED NOT DETECTED Final   Serratia marcescens NOT DETECTED NOT DETECTED Final   Haemophilus influenzae NOT DETECTED NOT DETECTED Final   Neisseria meningitidis NOT DETECTED NOT DETECTED Final   Pseudomonas aeruginosa NOT DETECTED NOT DETECTED Final   Stenotrophomonas maltophilia NOT DETECTED NOT DETECTED Final   Candida albicans NOT DETECTED NOT DETECTED Final   Candida auris NOT DETECTED NOT DETECTED Final   Candida glabrata NOT DETECTED NOT DETECTED Final   Candida krusei NOT DETECTED NOT DETECTED Final   Candida parapsilosis NOT  DETECTED NOT  DETECTED Final   Candida tropicalis NOT DETECTED NOT DETECTED Final   Cryptococcus neoformans/gattii NOT DETECTED NOT DETECTED Final   Methicillin resistance mecA/C DETECTED (A) NOT DETECTED Final    Comment: CRITICAL RESULT CALLED TO, READ BACK BY AND VERIFIED WITH: Tillman Sers PHARMD 2000 12/06/20 A BROWNING Performed at [facility], [place] [address] [place], [place] [address]      Studies: No results found.  Scheduled Meds: . acetaminophen (TYLENOL) oral liquid 160 mg/5 mL  650 mg Oral Q6H  . apixaban  2.5 mg Oral BID  . chlorhexidine gluconate (MEDLINE KIT)  15 mL Mouth Rinse BID  . Chlorhexidine Gluconate Cloth  6 each Topical Daily  . Chlorhexidine Gluconate Cloth  6 each Topical Q0600  . cholecalciferol  2,000 Units Oral Daily  . collagenase   Topical BID  . darbepoetin (ARANESP) injection - DIALYSIS  100 mcg Intravenous Q Sat-HD  . feeding supplement (NEPRO CARB STEADY)  237 mL Oral TID BM  . feeding supplement (PROSource TF)  45 mL Per Tube TID  . guaiFENesin  15 mL Oral Q12H  . insulin aspart  0-6 Units Subcutaneous TID WC  . mouth rinse  15 mL Mouth Rinse QID  . megestrol  200 mg Oral QID  . metoprolol tartrate  50 mg Oral BID  . midodrine  40 mg Oral 3 times per day on Tue Thu Sat  . multivitamin  1 tablet Oral QHS  . nutrition supplement (JUVEN)  1 packet Per Tube BID BM  . nystatin   Topical BID  . pantoprazole  40 mg Oral Daily  . sodium chloride flush  10-40 mL Intracatheter Q12H   Continuous Infusions: . albumin human 25 g (11/16/20 1516)  . feeding supplement (NEPRO CARB STEADY) 780 mL (12/08/20 0149)    Principal Problem:   Atrial fibrillation with RVR ([place]) Active Problems:   Acute respiratory failure due to COVID-19 [facility])   New onset a-fib (HCC)   Leukocytosis   Atrial fibrillation with rapid ventricular response (HCC)   Hypoxia   Pressure injury of skin   Acute hypoxemic respiratory failure (HCC)   ARDS (adult respiratory distress  syndrome) (HCC)   Perforated duodenal ulcer ([place])   On mechanically assisted ventilation ([place])   Palliative care by specialist   Goals of care, counseling/discussion   Shock ([place])   Acute and chronic respiratory failure (acute-on-chronic) ([place])   Status post tracheostomy ([place])   ESRD (end stage renal disease) ([place])   HAP (hospital-acquired pneumonia)   Consultants:  Cardiology  Surgery  Nephrology  Ethics  PCCM   Procedures: R PICC 11/5 >> A line 11/9 >> out ETT 11/9 > 11/16, 11/16 >> 09/21/2020, 09/21/2020 tracheostomy>> Lt [place] CVL 11/9 >> R IJ trialysis >> out HD catheter 12/1 >>12/20 12/21 - 14.5 Fr, 23 cm right IJ tunneled hemodialysis catheter placement. Removal of indwelling subclavian catheter.   Antibiotics: Anti-infectives (From admission, onward)   Start     Dose/Rate Route Frequency Ordered Stop   12/07/20 1200  vancomycin (VANCOCIN) IVPB 1000 mg/200 mL premix  Status:  Discontinued        1,000 mg 200 mL/hr over 60 Minutes Intravenous Every T-Th-Sa (Hemodialysis) 12/06/20 2156 12/07/20 1113   12/06/20 2245  vancomycin (VANCOREADY) IVPB 2000 mg/400 mL        2,000 mg 200 mL/hr over 120 Minutes Intravenous  Once 12/06/20 2156 12/07/20 0211   11/27/20 2000  ceFAZolin (ANCEF) IVPB 1 g/50 mL premix  1 g 100 mL/hr over 30 Minutes Intravenous Every 24 hours 11/26/20 1248 12/03/20 2136   11/26/20 1345  ceFAZolin (ANCEF) IVPB 1 g/50 mL premix        1 g 100 mL/hr over 30 Minutes Intravenous  Once 11/26/20 1248 11/26/20 1433   11/17/20 1415  fluconazole (DIFLUCAN) 40 MG/ML suspension 200 mg        200 mg Oral  Once 11/17/20 1315 11/17/20 1357   11/03/20 2200  meropenem (MERREM) 500 mg in sodium chloride 0.9 % 100 mL IVPB        500 mg 200 mL/hr over 30 Minutes Intravenous Every 24 hours 11/03/20 1500 11/06/20 2217   11/01/20 1000  anidulafungin (ERAXIS) 100 mg in sodium chloride 0.9 % 100 mL IVPB  Status:  Discontinued       "Followed by" Linked Group  Details   100 mg 78 mL/hr over 100 Minutes Intravenous Every 24 hours 10/31/20 0916 11/01/20 1404   10/31/20 1015  meropenem (MERREM) 1 g in sodium chloride 0.9 % 100 mL IVPB  Status:  Discontinued        1 g 200 mL/hr over 30 Minutes Intravenous Every 8 hours 10/31/20 0916 11/03/20 1500   10/31/20 1015  linezolid (ZYVOX) IVPB 600 mg  Status:  Discontinued        600 mg 300 mL/hr over 60 Minutes Intravenous Every 12 hours 10/31/20 0916 11/02/20 0906   10/31/20 1015  anidulafungin (ERAXIS) 200 mg in sodium chloride 0.9 % 200 mL IVPB       "Followed by" Linked Group Details   200 mg 78 mL/hr over 200 Minutes Intravenous  Once 10/31/20 0916 10/31/20 1630   10/25/20 1800  ceFAZolin (ANCEF) IVPB 2g/100 mL premix  Status:  Discontinued        2 g 200 mL/hr over 30 Minutes Intravenous Every 12 hours 10/25/20 1022 10/30/20 1103   10/23/20 2000  vancomycin (VANCOREADY) IVPB 750 mg/150 mL  Status:  Discontinued        750 mg 150 mL/hr over 60 Minutes Intravenous Every 24 hours 10/22/20 2036 10/25/20 1022   10/23/20 1800  piperacillin-tazobactam (ZOSYN) IVPB 3.375 g  Status:  Discontinued        3.375 g 100 mL/hr over 30 Minutes Intravenous Every 6 hours 10/23/20 1155 10/24/20 1422   10/23/20 0200  piperacillin-tazobactam (ZOSYN) IVPB 3.375 g  Status:  Discontinued        3.375 g 100 mL/hr over 30 Minutes Intravenous Every 8 hours 10/22/20 2036 10/23/20 1155   10/22/20 1630  vancomycin (VANCOREADY) IVPB 1500 mg/300 mL        1,500 mg 150 mL/hr over 120 Minutes Intravenous  Once 10/22/20 1537 10/22/20 2229   10/22/20 1630  piperacillin-tazobactam (ZOSYN) IVPB 2.25 g  Status:  Discontinued        2.25 g 100 mL/hr over 30 Minutes Intravenous Every 8 hours 10/22/20 1537 10/22/20 2036   10/22/20 1537  vancomycin variable dose per unstable renal function (pharmacist dosing)  Status:  Discontinued         Does not apply See admin instructions 10/22/20 1537 10/22/20 2036   10/19/20 0600  ceFAZolin  (ANCEF) IVPB 2g/100 mL premix        2 g 200 mL/hr over 30 Minutes Intravenous To Radiology 10/18/20 1457 10/19/20 0938   10/10/20 0830  vancomycin (VANCOCIN) IVPB 1000 mg/200 mL premix       "Followed by" Linked Group Details   1,000 mg 200 mL/hr  over 60 Minutes Intravenous Every 24 hours 10/09/20 0744 10/15/20 0902   10/09/20 0830  piperacillin-tazobactam (ZOSYN) IVPB 3.375 g        3.375 g 100 mL/hr over 30 Minutes Intravenous Every 6 hours 10/09/20 0744 10/16/20 0038   10/09/20 0830  vancomycin (VANCOREADY) IVPB 2000 mg/400 mL       "Followed by" Linked Group Details   2,000 mg 200 mL/hr over 120 Minutes Intravenous  Once 10/09/20 0744 10/09/20 1022   09/24/20 1000  ceFAZolin (ANCEF) IVPB 2g/100 mL premix  Status:  Discontinued        2 g 200 mL/hr over 30 Minutes Intravenous Every 12 hours 09/24/20 0801 09/24/20 1046   09/23/20 1400  vancomycin (VANCOCIN) IVPB 1000 mg/200 mL premix        1,000 mg 200 mL/hr over 60 Minutes Intravenous Every 24 hours 09/22/20 1436 09/28/20 1718   09/22/20 2200  ceFEPIme (MAXIPIME) 2 g in sodium chloride 0.9 % 100 mL IVPB  Status:  Discontinued        2 g 200 mL/hr over 30 Minutes Intravenous Every 12 hours 09/22/20 1436 09/24/20 0801   09/22/20 1030  ceFEPIme (MAXIPIME) 1 g in sodium chloride 0.9 % 100 mL IVPB        1 g 200 mL/hr over 30 Minutes Intravenous  Once 09/22/20 0934 09/22/20 1145   09/22/20 1015  vancomycin (VANCOCIN) IVPB 1000 mg/200 mL premix        1,000 mg 200 mL/hr over 60 Minutes Intravenous  Once 09/22/20 0934 09/22/20 1446   09/12/20 2200  ceFEPIme (MAXIPIME) 2 g in sodium chloride 0.9 % 100 mL IVPB        2 g 200 mL/hr over 30 Minutes Intravenous Every 12 hours 09/12/20 0732 09/14/20 2134   09/11/20 1400  ceFEPIme (MAXIPIME) 2 g in sodium chloride 0.9 % 100 mL IVPB  Status:  Discontinued        2 g 200 mL/hr over 30 Minutes Intravenous Every 8 hours 09/11/20 1126 09/12/20 0732   09/08/20 1000  vancomycin (VANCOREADY) IVPB  2000 mg/400 mL        2,000 mg 200 mL/hr over 120 Minutes Intravenous  Once 09/08/20 0857 09/08/20 1224   09/08/20 1000  ceFEPIme (MAXIPIME) 2 g in sodium chloride 0.9 % 100 mL IVPB  Status:  Discontinued        2 g 200 mL/hr over 30 Minutes Intravenous Every 12 hours 09/08/20 0857 09/11/20 1126   09/08/20 0856  vancomycin variable dose per unstable renal function (pharmacist dosing)  Status:  Discontinued         Does not apply See admin instructions 09/08/20 0857 09/09/20 0935   09/02/20 1600  cefTRIAXone (ROCEPHIN) 1 g in sodium chloride 0.9 % 100 mL IVPB  Status:  Discontinued        1 g 200 mL/hr over 30 Minutes Intravenous Every 24 hours 09/01/20 1811 09/02/20 0838   09/01/20 1800  fluconazole (DIFLUCAN) IVPB 400 mg        400 mg 50 mL/hr over 240 Minutes Intravenous  Once 09/01/20 1749 09/02/20 0603   09/01/20 1530  piperacillin-tazobactam (ZOSYN) IVPB 3.375 g        3.375 g 12.5 mL/hr over 240 Minutes Intravenous Every 8 hours 09/01/20 1514 09/05/20 2111   09/01/20 1000  levofloxacin (LEVAQUIN) tablet 250 mg  Status:  Discontinued        250 mg Oral Daily 08/31/20 1508 08/31/20 1735   08/31/20 1730  cefTRIAXone (ROCEPHIN) 1 g in sodium chloride 0.9 % 100 mL IVPB  Status:  Discontinued        1 g 200 mL/hr over 30 Minutes Intravenous Every 24 hours 08/31/20 1726 09/01/20 1513   08/31/20 1730  azithromycin (ZITHROMAX) 500 mg in sodium chloride 0.9 % 250 mL IVPB  Status:  Discontinued        500 mg 250 mL/hr over 60 Minutes Intravenous Every 24 hours 08/31/20 1726 09/02/20 0838       Time spent: 30 minutes    [name] ANP  [facility] 7 am - 330 pm/M-F for direct patient care and secure chat Please refer to Amion for contact info 98  days

## 2020-12-08 NOTE — Progress Notes (Signed)
SLP Cancellation Note  Patient Details Name: Candace [name] MRN: [id number] DOB: 1951/05/05   Cancelled treatment:       Reason Eval/Treat Not Completed:  (Pt receiving care from NT at this time. SLP will f/u.)  Leeza Heiner I. Hardin Negus, [place], [place] Office number [phone] Pager [place] 12/08/2020, 5:26 PM

## 2020-12-08 NOTE — Progress Notes (Addendum)
Intentional leader rounding with patient and daughter today. Patient very engaged today in conversation regarding her care and needs. Leadership following up on specifics and will discuss general ideas for a more individualize plan of care with providers on tomorrow and follow up with patient and daughter.

## 2020-12-08 NOTE — Progress Notes (Signed)
[facility] NEPHROLOGY PROGRESS NOTE  Assessment/ Plan:  # End-stage renal disease from prolonged dialysis dependent acute kidney Injury: Suspected to be multifactorial acute kidney injury from septic shock/MRSA pneumonia and hemorrhagic shock at the outside.  Has been on renal replacement therapy since 09/08/2020 and recently able to transition to intermittent hemodialysis from CRRT.  Intermittent hemodialysis has been challenging due to hypotension and positional discomfort from decubitus ulcer.  Creatinine remains misleadingly low and is indicative of significant lean muscle mass loss with prolonged hospitalization; urine output incompletely charted.  She is not a candidate for CIR and family have no desire to have her admitted to SNF or LTAC and intend to take her home upon discharge.  Continue TTS dialysis while here. Last HD on 2/8 with 1.6 L UF, next HD tomorrow. I will discontinue daily lab to minimize blood draw as patient is in hospital for a long period of time.  # History of Covid pneumonia with ARDS: Status post tracheostomy and respiratory status appears to be stable with supplementation via T-bar.  # Atrial fibrillation with rapid ventricular response: on metoprolol, tachycardic.  On anticoagulation with Eliquis.  # Severe protein calorie malnutrition with large sacral decubitus ulcer: Ongoing nutritional supplementation with wound care.  # CKD-MBD: Calcium and phosphorus level acceptable.  Continue monitor.  # Anemia: Complicated by GI bleed from perforated duodenal ulcer status post exploratory laparotomy with Phillip Heal patch placement.    Receiving IV iron and ESA.  Monitor hemoglobin.  Subjective: Seen and examined. Resting comfortably. No new event. Objective Vital signs in last 24 hours: Vitals:   12/08/20 0302 12/08/20 0351 12/08/20 0731 12/08/20 0826  BP: (!) 93/57   101/70  Pulse: 90 91 (!) 104 (!) 105  Resp: 20 20 20 20   Temp: 98.2 F (36.8 C)   98.2 F  (36.8 C)  TempSrc: Oral   Oral  SpO2: 100% 100% 100% 100%  Height:       Weight change:   Intake/Output Summary (Last 24 hours) at 12/08/2020 0913 Last data filed at 12/07/2020 1700 Gross per 24 hour  Intake -  Output 2500 ml  Net -2500 ml       Labs: Basic Metabolic Panel: Recent Labs  Lab 12/06/20 0117 12/07/20 0550 12/08/20 0303  NA 136 136 136  K 3.4* 3.4* 3.3*  CL 107 106 101  CO2 19* 19* 23  GLUCOSE 181* 147* 203*  BUN 35* 46* 23  CREATININE 1.03* 0.90 0.95  CALCIUM 9.5 10.0 8.8*  PHOS 3.7 3.5 2.8   Liver Function Tests: Recent Labs  Lab 12/06/20 0117 12/07/20 0550 12/08/20 0303  ALBUMIN 2.1* 2.0* 2.2*   No results for input(s): LIPASE, AMYLASE in the last 168 hours. No results for input(s): AMMONIA in the last 168 hours. CBC: Recent Labs  Lab 12/04/20 0819 12/06/20 0117 12/07/20 0550 12/08/20 0303  WBC 16.6* 13.7* 17.2* 17.5*  NEUTROABS  --  9.9* 12.1* 12.9*  HGB 7.5* 7.7* 7.4* 7.8*  HCT 24.2* 26.2* 25.2* 24.8*  MCV 84.9 87.6 86.9 86.1  PLT 417* 355 395 393   Cardiac Enzymes: No results for input(s): CKTOTAL, CKMB, CKMBINDEX, TROPONINI in the last 168 hours. CBG: Recent Labs  Lab 12/07/20 1537 12/07/20 1946 12/07/20 2343 12/08/20 0300 12/08/20 0825  GLUCAP 91 174* 114* 166* 193*    Iron Studies: No results for input(s): IRON, TIBC, TRANSFERRIN, FERRITIN in the last 72 hours. Studies/Results: DG Chest Port 1 View  Result Date: 12/08/2020 CLINICAL DATA:  Abnormal  respiration. EXAM: PORTABLE CHEST 1 VIEW COMPARISON:  November 27, 2020. FINDINGS: Stable cardiomegaly. No pneumothorax is noted. Tracheostomy and feeding tubes are unchanged in position. Right internal jugular catheter is noted with tip now noted in right atrium. Right-sided PICC line is unchanged. Stable bibasilar atelectasis or infiltrates are noted. Bony thorax is unremarkable. IMPRESSION: Stable support apparatus. Right internal jugular catheter is noted with tip now noted in  right atrium. Stable bibasilar atelectasis or infiltrates. Electronically Signed   By: Marijo Conception M.D.   On: 12/08/2020 08:14    Medications: Infusions: . albumin human 25 g (11/16/20 1516)  . feeding supplement (NEPRO CARB STEADY) 780 mL (12/08/20 0149)    Scheduled Medications: . acetaminophen (TYLENOL) oral liquid 160 mg/5 mL  650 mg Oral Q6H  . apixaban  2.5 mg Oral BID  . chlorhexidine gluconate (MEDLINE KIT)  15 mL Mouth Rinse BID  . Chlorhexidine Gluconate Cloth  6 each Topical Daily  . Chlorhexidine Gluconate Cloth  6 each Topical Q0600  . cholecalciferol  2,000 Units Oral Daily  . collagenase   Topical BID  . darbepoetin (ARANESP) injection - DIALYSIS  100 mcg Intravenous Q Sat-HD  . feeding supplement (NEPRO CARB STEADY)  237 mL Oral TID BM  . feeding supplement (PROSource TF)  45 mL Per Tube TID  . guaiFENesin  15 mL Oral Q12H  . insulin aspart  0-6 Units Subcutaneous TID WC  . mouth rinse  15 mL Mouth Rinse QID  . megestrol  200 mg Oral QID  . metoprolol tartrate  50 mg Oral BID  . midodrine  40 mg Oral 3 times per day on Tue Thu Sat  . multivitamin  1 tablet Oral QHS  . nutrition supplement (JUVEN)  1 packet Per Tube BID BM  . nystatin   Topical BID  . pantoprazole  40 mg Oral Daily  . sodium chloride flush  10-40 mL Intracatheter Q12H    have reviewed scheduled and prn medications.  Physical Exam: General:NAD, comfortable, tracheostomy Heart:RRR, s1s2 nl Lungs:clear b/l, no crackle Abdomen:soft, Non-tender, non-distended Extremities: Trace dependent edema. Dialysis Access: Right IJ TDC in place.  Candace [name] Sherrita Riederer 12/08/2020,9:13 AM  LOS: 98 days

## 2020-12-08 NOTE — Progress Notes (Addendum)
Physical Therapy Wound Treatment Patient Details  Name: Candace [name] MRN: [id number] Date of Birth: 1951-09-06  Today's Date: 12/08/2020 Time: 3151-7616 Time Calculation (min): 60 min  Subjective  Subjective: Pt daughter reporting pt having more anxiety today Patient and Family Stated Goals: heal wound Date of Onset:  (unknown) Prior Treatments: prior hydro and Dakins  Pain Score:2/10  Wound Assessment  Pressure Injury 09/25/20 Sacrum Bilateral;Medial Deep Tissue Pressure Injury - Purple or maroon localized area of discolored intact skin or blood-filled blister due to damage of underlying soft tissue from pressure and/or shear. Purple, non-blanchable, b (Active)  Dressing Type Barrier Film (skin prep);Negative pressure wound therapy 12/08/20 1245  Dressing Changed 12/08/20 1245  Dressing Change Frequency Monday, Wednesday, Friday 12/08/20 1245  State of Healing Early/partial granulation 12/08/20 1245  Site / Wound Assessment Black;Yellow;Pink;Red;Granulation tissue 12/08/20 1245  % Wound base Red or Granulating 40% 12/08/20 1245  % Wound base Yellow/Fibrinous Exudate 50% 12/08/20 1245  % Wound base Black/Eschar 10% 12/08/20 1245  % Wound base Other/Granulation Tissue (Comment) 10% 12/03/20 1405  Peri-wound Assessment Intact 12/08/20 1245  Wound Length (cm) 14 cm 12/01/20 1722  Wound Width (cm) 12 cm 12/01/20 1722  Wound Depth (cm) 6 cm 12/01/20 1722  Wound Surface Area (cm^2) 168 cm^2 12/01/20 1722  Wound Volume (cm^3) 1008 cm^3 12/01/20 1722  Tunneling (cm) 0 12/01/20 1722  Undermining (cm) 0 12/01/20 1722  Margins Unattached edges (unapproximated) 12/08/20 1245  Drainage Amount Minimal 12/08/20 1245  Drainage Description Sanguineous 12/08/20 1245  Treatment Debridement (Selective);Negative pressure wound therapy 12/08/20 1245     Selective Debridement Selective Debridement - Location: sacrum Selective Debridement - Tools Used: Forceps;Scalpel Selective Debridement - Tissue  Removed: necrotic adipose, eschar   Wound Assessment and Plan  Wound Therapy - Assess/Plan/Recommendations Wound Therapy - Clinical Statement: Wound continues to demonstrate improvement with granulation tissue increasing to 40%.Continue plan. Wound Therapy - Functional Problem List: Global weakness and immobility Factors Delaying/Impairing Wound Healing: Diabetes Mellitus;Immobility;Multiple medical problems;Other (comment) (poor nutrition) Hydrotherapy Plan: Debridement;Pulsatile lavage with suction;Patient/family education;Dressing change Wound Therapy - Frequency: 6X / week Wound Therapy - Follow Up Recommendations: Skilled nursing facility Wound Plan: see above  Wound Therapy Goals- Improve the function of patient's integumentary system by progressing the wound(s) through the phases of wound healing (inflammation - proliferation - remodeling) by: Decrease Necrotic Tissue to: 40 Decrease Necrotic Tissue - Progress: Goal set today Increase Granulation Tissue to: 60 Increase Granulation Tissue - Progress: Goal set today Goals/treatment plan/discharge plan were made with and agreed upon by patient/family: Yes Time For Goal Achievement: 7 days Wound Therapy - Potential for Goals: Fair  Goals will be updated until maximal potential achieved or discharge criteria met.  Discharge criteria: when goals achieved, discharge from hospital, MD decision/surgical intervention, no progress towards goals, refusal/missing three consecutive treatments without notification or medical reason.  GP    [name], PT, DPT Acute Rehabilitation Services Pager [phone] Office [phone]   [name] 12/08/2020, 12:50 PM

## 2020-12-08 NOTE — Progress Notes (Signed)
SLP Cancellation Note  Patient Details Name: Zamariya Neal MRN: [id number] DOB: 25-Nov-1950   Cancelled treatment:       Reason Eval/Treat Not Completed: Patient at procedure or test/unavailable (Pt working with physical therapy at this time for hydrotherapy. SLP will follow up on subsequent date.)  Slayton Lubitz I. Hardin Negus, [place], [place] Office number [phone] Pager [phone]  Horton Marshall 12/08/2020, 10:48 AM

## 2020-12-09 DIAGNOSIS — I4891 Unspecified atrial fibrillation: Secondary | ICD-10-CM | POA: Diagnosis not present

## 2020-12-09 LAB — CBC
HCT: 26.8 % — ABNORMAL LOW (ref 36.0–46.0)
Hemoglobin: 8.1 g/dL — ABNORMAL LOW (ref 12.0–15.0)
MCH: 26.6 pg (ref 26.0–34.0)
MCHC: 30.2 g/dL (ref 30.0–36.0)
MCV: 87.9 fL (ref 80.0–100.0)
Platelets: 443 10*3/uL — ABNORMAL HIGH (ref 150–400)
RBC: 3.05 MIL/uL — ABNORMAL LOW (ref 3.87–5.11)
RDW: 17.9 % — ABNORMAL HIGH (ref 11.5–15.5)
WBC: 16.5 10*3/uL — ABNORMAL HIGH (ref 4.0–10.5)
nRBC: 0.4 % — ABNORMAL HIGH (ref 0.0–0.2)

## 2020-12-09 LAB — RENAL FUNCTION PANEL
Albumin: 2.2 g/dL — ABNORMAL LOW (ref 3.5–5.0)
Anion gap: 12 (ref 5–15)
BUN: 63 mg/dL — ABNORMAL HIGH (ref 8–23)
CO2: 23 mmol/L (ref 22–32)
Calcium: 10.7 mg/dL — ABNORMAL HIGH (ref 8.9–10.3)
Chloride: 102 mmol/L (ref 98–111)
Creatinine, Ser: 1 mg/dL (ref 0.44–1.00)
GFR, Estimated: >60 mL/min (ref >60)
Glucose, Bld: 163 mg/dL — ABNORMAL HIGH (ref 70–99)
Phosphorus: 3.2 mg/dL (ref 2.5–4.6)
Potassium: 3.1 mmol/L — ABNORMAL LOW (ref 3.5–5.1)
Sodium: 137 mmol/L (ref 135–145)

## 2020-12-09 LAB — GLUCOSE, CAPILLARY
Glucose-Capillary: 118 mg/dL — ABNORMAL HIGH (ref 70–99)
Glucose-Capillary: 154 mg/dL — ABNORMAL HIGH (ref 70–99)
Glucose-Capillary: 170 mg/dL — ABNORMAL HIGH (ref 70–99)
Glucose-Capillary: 186 mg/dL — ABNORMAL HIGH (ref 70–99)
Glucose-Capillary: 203 mg/dL — ABNORMAL HIGH (ref 70–99)

## 2020-12-09 MED ORDER — HEPARIN SODIUM (PORCINE) 1000 UNIT/ML DIALYSIS: 1000.0000 [IU] | INTRAMUSCULAR | Status: DC | PRN

## 2020-12-09 MED ORDER — PENTAFLUOROPROP-TETRAFLUOROETH EX AERO: 1.0000 "application " | INHALATION_SPRAY | CUTANEOUS | Status: DC | PRN

## 2020-12-09 MED ORDER — LIDOCAINE HCL (PF) 1 % IJ SOLN: 5.0000 mL | INTRAMUSCULAR | Status: DC | PRN

## 2020-12-09 MED ORDER — SODIUM CHLORIDE 0.9 % IV SOLN: 100.0000 mL | INTRAVENOUS | Status: DC | PRN

## 2020-12-09 MED ORDER — ALTEPLASE 2 MG IJ SOLR: 2.0000 mg | Freq: Once | INTRAMUSCULAR | Status: DC | PRN

## 2020-12-09 MED ORDER — LIDOCAINE-PRILOCAINE 2.5-2.5 % EX CREA: 1.0000 "application " | TOPICAL_CREAM | CUTANEOUS | Status: DC | PRN

## 2020-12-09 MED ORDER — HYDROXYZINE HCL 10 MG PO TABS
10.0000 mg | ORAL_TABLET | Freq: Three times a day (TID) | ORAL | Status: DC | PRN
  Administered 2020-12-09 – 2020-12-12 (×4): 10 mg via ORAL
  Filled 2020-12-09 (×8): qty 1

## 2020-12-09 MED ORDER — OXYCODONE HCL 5 MG PO TABS
ORAL_TABLET | ORAL | Status: AC
  Filled 2020-12-09: qty 2

## 2020-12-09 MED ORDER — HEPARIN SODIUM (PORCINE) 1000 UNIT/ML IJ SOLN
INTRAMUSCULAR | Status: AC
  Filled 2020-12-09: qty 4

## 2020-12-09 MED ORDER — DICLOFENAC SODIUM 1 % EX GEL
2.0000 g | Freq: Four times a day (QID) | CUTANEOUS | Status: DC
  Administered 2020-12-09 – 2020-12-23 (×48): 2 g via TOPICAL
  Filled 2020-12-09 (×2): qty 100

## 2020-12-09 NOTE — Progress Notes (Addendum)
This nurse, assisting nurse Tammi Klippel and c.n.a. Stanton Kidney came into patient room to provide meds and to assess wound vac. Patient asked would she like oxy for pain prevention before assessing and rolling patient. Patient stated no, just tylenol. Patient family hovering over staff and had to be asked to allow staff to assist nurse with patient. Patient family stated that she could give patient some of the medications. Patient family informed this nurse would administer medications to patient. Patients daughter stated the doctor would be ok with it. Patient family made aware that if doctor was ok with it, then we would need an order, that if patient family wanted me to reach out to doctor with the request for her to administer meds to let me know. No further request made at that time for family administration. Patient daughter also with sarcasm asked how meds were going to affect patient being given late. Patient family informed that due to patient being in dialysis previously and returning late to the unit and receiving morning meds at return, evening medications were pretty consistent with time frames and any additional adjustments can be made with staff collaborating with pharmacy.  Patient made aware was going to begin to turn her. Patient family standing at the edge of bed with phone pointing directly at staff and patient. This nurse asked if family could please stand to side. Patient family asked why. Made family aware that they are making this nurse uncomfortable with their phone. Other nurse asked patient family if they were recording [place]. Patient family responded no, what you nervous that I'm going to record you doing something wrong and show who you really are. Patient family made aware that we were not participating in that conversation. Patient daughter called someone on the phone and stated I need you to come up here. Person on the phone said I'm on my way. Care finished with patient, patient asked if they  wanted staff to replace pillows, patient said no her daughter will do it. Patient left safe.

## 2020-12-09 NOTE — Progress Notes (Signed)
I went over all medications with patient 1 by 1. Pt is alert and oriented x4 .  Pt refused Nepro Carb Steady and the Guaifenesin. Pt stated she was feeling some pain and requested Oxycodone and would like the Tylenol pushed back for later on in the night. Pt was also given Voltaren and was given massage on her legs bilaterally. All Pt needs have been completed. (Ordered by MD and requested by Pt & daughter)  Pt is comfortable and asleep. Call light left in reach.

## 2020-12-09 NOTE — Progress Notes (Signed)
Pt was repositioned @ 2025. Per daughters request. Pts daughter refused to leave after visiting hours until she spoke with primary RN to advise her how she expects care to be done for her mother overnight and voice her concerns. . Stated in the past other nurses have allowed her to give her mother meds. Pts daughter was advised I do not have any written orders allowing so and I felt uncomfortable allowing her to do so, and was not going to risk losing my license. She acknowledged my decision.  Pts daughter called the front desk at 2130,  bc her mother was" ready to be repositioned". Advised I was in the middle of new admission on the floor & when available, repositioning would be performed.

## 2020-12-09 NOTE — Progress Notes (Signed)
Called to bedside to see Candace [name] and daughter.  Ms Ottey has been having some pain in her lateral calf and superior achilles tendon.  Will do trial of voltaren gel and if this does not work can try lidocaine patches.  She is also having sharp (needle like) pain in her foot- possibly neuropathy-- check B12 but this could be related to her critical illness.  Patient also having a lot of anxiety- daughter says she had klonopin in the past but made her very sleepy- can do trial of low dose hydroxyzine. Eulogio Bear DO

## 2020-12-09 NOTE — Progress Notes (Signed)
Patient turned Q2 this shift and when requested each time. Pain medications offered and refused this shift.

## 2020-12-09 NOTE — Progress Notes (Signed)
OT Cancellation Note  Patient Details Name: Candace [name] MRN: [id number] DOB: 1950-11-21   Cancelled Treatment:    Reason Eval/Treat Not Completed: Patient at procedure or test/ unavailable Pt off unit for HD. Will follow up for OT session as time allows.   [name] 12/09/2020, 9:00 AM

## 2020-12-09 NOTE — Progress Notes (Addendum)
Pt returned to unit. Request repositioning. Staff requested to help. C/o pain 6/10 "r butt". Pt assisted with pericare and new external catheter placed. Pt was assisted with repositioning * 2. At the same timeframe, pt was noted breathing fast and was tachycardiac. O2 sats in 90's. Consistent with dialysis report of pt having increased heart rate, low b/p and needing to be suctioned by respiratory. Room air was placed on pt to assist. Respiratory therapy was called to assess patient. Medications were being prepared to assist with heart rate and pain. Ostomy bag was evaluated for fullness. Patient asked if tylenol would help pain. Pt stated a little. Informed patient we could try something additional or different for pain. Pt agreeable with tylenol. Pt request staff to call daughter, cna assisted. Pt asked daughter when they were coming to hospital, which replied they were here. Daughter came to room yelling at staff and becoming increasingly verbally aggressive. Stated that staff did not help nor turn her mother and staff was not addressing patients needs. Attempted to inform patient family, non-receptive to information. Patient daughter continued to verbalize displeasure. When this nurse allowed educated family on normal parameters for heart rate and respirations in comparisons to her mother and priority at that moment. Explained to patient daughter staff is not neglecting patient pain nor request for repositioning, but that accusation of [place] not repositioning her was incorrect. Family expressed that parent has anxiety about dialysis and pain. Informed family that a discussion to facilitate prevention of anxiety and pain relief related to dialysis is possible for initiation. Offered patient prn dilaudid for pain. Pt daughter refused pain medication for patient. Patient latter with staff present to confirm that she does or does not want pain medication dilaudid or oxycodone. Patient refused both pain medications.  Patient remained safe in room, daughter present.

## 2020-12-09 NOTE — Progress Notes (Signed)
[facility] NEPHROLOGY PROGRESS NOTE  Assessment/ Plan:  # End-stage renal disease from prolonged dialysis dependent acute kidney Injury: Suspected to be multifactorial acute kidney injury from septic shock/MRSA pneumonia and hemorrhagic shock at the outside.  Has been on renal replacement therapy since 09/08/2020 and recently able to transition to intermittent hemodialysis from CRRT.  Intermittent hemodialysis has been challenging due to hypotension and positional discomfort from decubitus ulcer.  Creatinine remains misleadingly low and is indicative of significant lean muscle mass loss with prolonged hospitalization; urine output incompletely charted.  She is not a candidate for CIR and family have no desire to have her admitted to SNF or LTAC and intend to take her home upon discharge.  Continue TTS dialysis while here. Last HD on 2/8 with 1.6 L UF, next HD today.  Minimize lab draws.  # History of Covid pneumonia with ARDS: Status post tracheostomy and respiratory status appears to be stable with supplementation via T-bar.  # Atrial fibrillation with rapid ventricular response: on metoprolol, tachycardic.  On anticoagulation with Eliquis.  # Severe protein calorie malnutrition with large sacral decubitus ulcer: Ongoing nutritional supplementation with wound care.  # CKD-MBD: Calcium and phosphorus level acceptable.  Continue monitor.  # Anemia: Complicated by GI bleed from perforated duodenal ulcer status post exploratory laparotomy with Phillip Heal patch placement.    Receiving IV iron and ESA.  Monitor hemoglobin.  Subjective: Seen and examined.  No new event.  Comfortable.  Alert awake and following commands. Objective Vital signs in last 24 hours: Vitals:   12/09/20 0006 12/09/20 0335 12/09/20 0408 12/09/20 0737  BP: 118/70  119/70 134/72  Pulse: (!) 107 99 81 99  Resp: (!) 23 (!) 25 20 20   Temp: 98.1 F (36.7 C)  97.9 F (36.6 C) 98 F (36.7 C)  TempSrc: Oral  Oral  Oral  SpO2: 100% 97% 95% 100%  Height:       Weight change:   Intake/Output Summary (Last 24 hours) at 12/09/2020 0816 Last data filed at 12/09/2020 0600 Gross per 24 hour  Intake 26 ml  Output 850 ml  Net -824 ml       Labs: Basic Metabolic Panel: Recent Labs  Lab 12/06/20 0117 12/07/20 0550 12/08/20 0303  NA 136 136 136  K 3.4* 3.4* 3.3*  CL 107 106 101  CO2 19* 19* 23  GLUCOSE 181* 147* 203*  BUN 35* 46* 23  CREATININE 1.03* 0.90 0.95  CALCIUM 9.5 10.0 8.8*  PHOS 3.7 3.5 2.8   Liver Function Tests: Recent Labs  Lab 12/06/20 0117 12/07/20 0550 12/08/20 0303  ALBUMIN 2.1* 2.0* 2.2*   No results for input(s): LIPASE, AMYLASE in the last 168 hours. No results for input(s): AMMONIA in the last 168 hours. CBC: Recent Labs  Lab 12/04/20 0819 12/06/20 0117 12/07/20 0550 12/08/20 0303  WBC 16.6* 13.7* 17.2* 17.5*  NEUTROABS  --  9.9* 12.1* 12.9*  HGB 7.5* 7.7* 7.4* 7.8*  HCT 24.2* 26.2* 25.2* 24.8*  MCV 84.9 87.6 86.9 86.1  PLT 417* 355 395 393   Cardiac Enzymes: No results for input(s): CKTOTAL, CKMB, CKMBINDEX, TROPONINI in the last 168 hours. CBG: Recent Labs  Lab 12/08/20 1238 12/08/20 1649 12/08/20 2034 12/09/20 0000 12/09/20 0737  GLUCAP 200* 205* 159* 186* 154*    Iron Studies: No results for input(s): IRON, TIBC, TRANSFERRIN, FERRITIN in the last 72 hours. Studies/Results: DG Chest Port 1 View  Result Date: 12/08/2020 CLINICAL DATA:  Abnormal respiration. EXAM: PORTABLE CHEST 1  VIEW COMPARISON:  November 27, 2020. FINDINGS: Stable cardiomegaly. No pneumothorax is noted. Tracheostomy and feeding tubes are unchanged in position. Right internal jugular catheter is noted with tip now noted in right atrium. Right-sided PICC line is unchanged. Stable bibasilar atelectasis or infiltrates are noted. Bony thorax is unremarkable. IMPRESSION: Stable support apparatus. Right internal jugular catheter is noted with tip now noted in right atrium. Stable  bibasilar atelectasis or infiltrates. Electronically Signed   By: Marijo Conception M.D.   On: 12/08/2020 08:14    Medications: Infusions: . albumin human 25 g (11/16/20 1516)    Scheduled Medications: . acetaminophen (TYLENOL) oral liquid 160 mg/5 mL  650 mg Oral Q6H  . apixaban  2.5 mg Oral BID  . chlorhexidine gluconate (MEDLINE KIT)  15 mL Mouth Rinse BID  . Chlorhexidine Gluconate Cloth  6 each Topical Daily  . Chlorhexidine Gluconate Cloth  6 each Topical Q0600  . Chlorhexidine Gluconate Cloth  6 each Topical Q0600  . cholecalciferol  2,000 Units Oral Daily  . collagenase   Topical BID  . darbepoetin (ARANESP) injection - DIALYSIS  100 mcg Intravenous Q Sat-HD  . feeding supplement (NEPRO CARB STEADY)  237 mL Oral TID BM  . feeding supplement (NEPRO CARB STEADY)  780 mL Per Tube Q24H  . feeding supplement (PROSource TF)  45 mL Per Tube TID  . guaiFENesin  15 mL Oral Q12H  . insulin aspart  0-6 Units Subcutaneous TID WC  . mouth rinse  15 mL Mouth Rinse QID  . megestrol  200 mg Oral QID  . metoprolol tartrate  50 mg Oral BID  . midodrine  40 mg Oral 3 times per day on Tue Thu Sat  . multivitamin  1 tablet Oral QHS  . nutrition supplement (JUVEN)  1 packet Per Tube BID BM  . nystatin   Topical BID  . pantoprazole  40 mg Oral Daily  . sodium chloride flush  10-40 mL Intracatheter Q12H    have reviewed scheduled and prn medications.  Physical Exam: General:NAD, comfortable, tracheostomy Heart:RRR, s1s2 nl Lungs:clear b/l, no crackle Abdomen:soft, Non-tender, non-distended Extremities: Trace dependent edema. Dialysis Access: Right IJ TDC in place.  Dron Prasad Bhandari 12/09/2020,8:16 AM  LOS: 99 days

## 2020-12-09 NOTE — Progress Notes (Signed)
SLP Cancellation Note  Patient Details Name: Candace [name] MRN: [id number] DOB: 1951-06-24   Cancelled treatment:       Reason Eval/Treat Not Completed: Patient at procedure or test/unavailable (Pt receiving care from RN at this time. SLP waited for 15 minutes, but pt was still unavailable and Dr. Eliseo Squires then arrived to meet with pt and family. SLP will reattempt treatment on subsequent date.)  Antoin Dargis I. Hardin Negus, [place], [place] Office number [phone] Pager [place] 12/09/2020, 4:21 PM

## 2020-12-09 NOTE — Progress Notes (Signed)
AC, Production assistant, radio and myself reported to the room to assist the primary nurse after she called out for respiratory therapy to be called. When we arrived to the room, primary RN was performing patient care. Patients daughter entered the room as well and exhibited hostile behavior because her mother needed to be turned. The primary RN and [facility] was trying to explain that we have to prioritize certain nursing tasks over turning and once those were completed we would turn her. Daughter said that we were not listening to her mothers request to being turned and that they never turn in dialysis. Primary RN explained that from what she received in report, she wasn't told that the patient was in pain but instead she was addressing the issues that she did receive in report. I asked to speak with daughter outside and explained to her that hostile behavior is not okay and can't be tolerated. I explained to her that I heard and understood her concerns but the nurse performs tasks based on nursing judgement. Her daughter continued to display hostile behavior. I explained to her that if she continues, we will call security. She accused me of "pulling rank" and I told her that's not the case but I'm simply advocating, supporting and looking after my staff and her mother to ensure that they have an environment to promote care.

## 2020-12-09 NOTE — Progress Notes (Signed)
[facility] PROGRESS NOTE  Candace [name] JWJ:[id number] DOB: 03/09/1951 DOA: 08/31/2020 PCP: Candace Greenhouse, MD            Status: Remains inpatient appropriate because:Ongoing diagnostic testing needed not appropriate for outpatient work up, Unsafe d/c plan, IV treatments appropriate due to intensity of illness or inability to take PO and Inpatient level of care appropriate due to severity of illness   Dispo: The patient is from: Home              Anticipated d/c is to: Home with home health               Anticipated d/c date is: > 3 days              Patient currently is not medically stable to d/c.  Barriers to discharge: Continues to require HD- unable to sit in chair/get in [facility] for OP HD-also requiring Cortrak for nocturnal feeds; still has trach with copiuous secretions at times  Considerations for home discharge: 1. Patient will need to continue wound VAC and current wound VAC is apparently not available in the home setting 2. It is likely she will need a specialty bed.  We are currently transitioning back to the [facility] rotational bed which will allow for better in bed mobility including the ability to "stand" with PT in the bed without actually getting patient out of bed 3. Likely would need to be decannulated to minimize other care issues after discharge 4. Would need to be able to either mobilize for outpatient hemodialysis or be a candidate for home dialysis 5. Would not have aggressive PT OT available in the home setting that she can achieve during hospitalization 6. Patient will have family members that are CNAs managing her after discharge but this would be 24/7 requirement and this could be an overwhelming issue once patient is actually home  Code Status: Full Family Communication: 2/3 daughter Candace [name] at bedside-extensive conversation held at bedside with daughter to include myself, Retail buyer, Editor, commissioning, and [facility] personnel regarding expectations  for IP care and dc planning DVT prophylaxis: Eliquis Vaccination status: Has not been vaccinated but did have severe COVID infection initially diagnosed on 08/23/2020.  Patient will be eligible for COVID-vaccine after 11/23/2020   Foley catheter: No, purewick female urinary collection device   HPI: 70 year old female patient with prior history of diabetes, stage III chronic kidney disease, atrial fibrillation, hypertension, dyslipidemia and colon cancer that is post colectomy and colostomy.  Initially diagnosed with COVID on 08/23/20 she was admitted to the hospital due to dehydration, acute kidney injury and hypoxemic respiratory failure.  She was discharged home on 2 to 3 L of oxygen after being treated with IV steroids and remdesivir.  During that time although she had elevated inflammatory markers she had no embolic or thrombotic disease.  She had been on prophylactic dose heparin during the hospitalization and was placed on Eliquis for 2 weeks for after discharge  Patient returned to the ER on 08/31/2020 for complaints of not feeling well.  She was found to be in atrial fibrillation with RVR, hemorrhagic shock and work-up revealed a perforated duodenal ulcer.  She underwent exploratory laparotomy on 11/3.  She has had an extensive ICU stay with significant events as below.   11/2 Admitted with rapid Afib 11/3 OR with findings of perforated duodenal ulcer 11/10 progressive hemorrhagic shock, intubated, transfused, pressors, proned; started on CRRT in PM 11/16 Extubated. Re-intubated overnight due to respiratory distress  and hypoxia with decreased mentation 11/18 Bronch, cultures sent 11/19 Hgb down getting blood 11/24 Spiked fever, resume empirical antimicrobial therapy 11/26 Hemorrhagic shock, hgb 5.6, increased pressors,  11/30 Per palliative "Candace [name] understanding that patient is unfortunately very fragiledespite ongoing intensive medical treatment and full mechanical support.  [name] that the familywantsto continue with all current interventions despite potential outcomes". 12/08 CRRT discontinued due to clotting 12/09 Family requested transfer to tertiary care [facility]). [facility] denied transfer  12/10 CRRT restarted. Episodes of tachycardia, tachypnea that seem to improve with pain management 12/11 Back in shock. Pressor requirements up. CXR worse. ABX resumed 12/12 Still requiring inc pressors. Had hypoglycemic event. Basal insulin dosing adjusted 12/13 Pressor requirements better. Now hyperglycemic. Re-adjusted Glycemic control  12/19 Afebrile . Remains on dilaudid and heparin gtt, dilaudid gtt increased overnight for concern of pain / ongoing tachycardia, no other events . NE and precedex off 12/17. Ongoing CRRT- even UF, dosing lokelmia this morning 12/20 On CRRT. Renal plans for HD cath and moving to HD. Getting wound care 12/24 - renal stopping CRRT today and plans HD 10/24/20 . 40% fio2 on vent via Trach. TAchypenic and tachycardic. Afebrile but wbc up to 27.6K. On TF. On heparin gtt 12/25 - Back on CRRT. On vent via trach at 40% fio2, On scheduled dilaudid as add on to oxy. Per family request 12/24 - they felt scheduled oxy was not adequate and patient was showing signs of opioid withdrawal.  Patient also had worsening SIRS/sepsis syndrome. Had fever, rising wbc, worsening RR 40-60 and HR 140s sinus->started On abx yesterday. Fever some better today. WBC plateau at 28,.5K. On new levophed ->signifanct improvement in HR 77 and RR t0 20. On heparin gtt. On precedex gtt. On levophed gtt 23mcg wthi midodrine. On TF 12/30 Remains on CRRT with intermittent pressor requirements. Ethics consult placed 12/29. Ethics rec time trial of CRRT 12/31 failed SBT with RR 40s. Several conversations between care tam and family, who are upset RE plan of care 1/1 back on pressors  1/4: On pressors, keeping even on CVVHD, HGB drop to 6.9, transfused 1 unit. Improving mental  status 1/10 remains on low dose levophed 62mcg 1/11 off levo, attempting HD with UF for first time. Now tolerating intermittent HD. 1/19 patient transferred to progressive bed with tracheostomy on 35% FiO2.  has NG tube feeding.  No PEG tube.  Received dialysis on 1/18. 1/24 significant improvement and persistent tachycardia after introduction of twice daily beta-blocker.  Heart rates have improved from the 130s to the 98-104 range 1/24 core track clogged and therefore has been removed by nutrition team.  Calorie count in progress and we are weaning any sedating medications which could be contributing to patient's inability to eat. 1/27 continue w/ cuff #6 trach   Subjective: Examined during hemodialysis.  Reports sacral pain in regards to positioning during dialysis.  Discussed with dialysis nurse who states attempts to position patient on side currently are impacting flow to dialysis catheter.  Even with patient lying on her back she continues to have difficulty with catheter flow.  Discussed with patient how much oxycodone she will need to treat her pain and she reported 10 mg.  This information was given to the dialysis nurse who was instructed to call the patient's nurse on 2 W. to have the medication brought up to the dialysis unit for the patient to take.   Objective: Vitals:   12/09/20 0335 12/09/20 0408  BP:  119/70  Pulse: 99 81  Resp: Marland Kitchen)  25 20  Temp:  97.9 F (36.6 C)  SpO2: 97% 95%    Intake/Output Summary (Last 24 hours) at 12/09/2020 0735 Last data filed at 12/09/2020 0600 Gross per 24 hour  Intake 26 ml  Output 850 ml  Net -824 ml   Filed Weights    Exam: Constitutional: Awake, uncomfortable with self-report of significant sacral wound pain, somewhat restless due to pain Respiratory: Lungs coarse but clear to auscultation FiO2 21%. #4 cuffed trach with PMV in place.  No secretions noted Cardiovascular: Heart sounds S1-S2, pulse regular with tachycardia in context of  pain and dialysis treatment and process, trace peripheral edema Abdomen: On regular diet with nectar thick liquids- LBM 2/2-core track .  Right colostomy  Skin:  Massive sacral decubitus ulcer-wound VAC  Neurologic: CN 2-12 grossly intact. Sensation intact, DTR normal. Strength 1/5 x all 4 extremities.  Psychiatric: Awake, flat affect, oriented x3   Assessment/Plan: Acute problems: PAF maintaining sinus rhythm w/persistent tachycardia -Continue metoprolol 50 mg twice daily.  Pulse well controlled -Echocardiogram this admission with an EF 50 to 55% with mild diastolic dysfunction parameters and normal RV systolic function -Continue Eliquis-benefits coordinator has determined that Eliquis is covered at [facility] in [place] with a co-pay of $47  Acute respiratory failure secondary to COVID-pneumonia/tracheostomy/acute MSSA tracheobronchitis -Patient stable on low-flow oxygen FiO2 21% -PCCM/trach team following-change to #4 trach on 2/4 -Repeat respiratory culture on 1/24 with abundant staph with sensitivities pending -Completed IV Ancef for MSSA tracheobronchitis -Continue  suctioning and chest PT-Vibra vest -Continue PMV training  -Chest x-ray from 2/9 stated stable bibasilar atelectasis or infiltrates  Acute kidney injury secondary to COVID-related sepsis with shock superimposed on stage III chronic kidney disease -Continue intermittent hemodialysis as directed by the renal team -Avoid nephrotoxic medications -Continue Aranesp on dialysis days -Patient is voiding and in the past 24 hours had 300 cc of urine out -BP improved therefore will change midodrine to use on dialysis days only 3 times daily  Lower extremity muscle pain -Continue Crea topically -Continue PROM and AROM -Family to bring in tennis shoes to wear in an alternating schedule to help improve foot flexion noting this improves her leg pain  Dysphagia/moderate to severe protein calorie malnutrition Nutrition  Status: Nutrition Problem: Increased nutrient needs Etiology: wound healing Signs/Symptoms: estimated needs Interventions: Refer to RD note for recommendations  Estimated body mass index is 31.04 kg/m as calculated from the following:   Height as of this encounter: 5\' 8"  (1.727 m).   Weight as of this encounter: 92.6 kg.  -2/3: Continue regular diet with nectar thick liquids -2/7 at recommendation of nutrition team resumed nocturnal tube feedings -Continue Megace -2/9 follow-up SLP evaluation pending  Acute hemorrhagic shock secondary to perforated duodenal ulcer/anemia of critical illness (initial reason for admission) -Stable postoperatively -Hemoglobin remains stable greater than 8.0  Diabetes mellitus 2 -Patient has been transition to regular diet along with tube feedings and we have noted significant increase in CBG readings -Continue very sensitive SSI with meals -HgbA1c 08/24/2020 and was 7.0 -Prior to admission patient was on metformin XR-unable to use at this juncture due to severity of kidney disease -we are hopeful for recovery of renal function  Profound physical deconditioning/acute encephalopathy secondary to prolonged hospital stay/orthostasis -SNF recommended but family wants to take patient home -At this juncture patient unable to sit up in bed independently much less stand pivot and rotate to get into wheelchair and would be significant fall risk and care risk at home -In  addition patient would need to be able to get in wheelchair to transport to dialysis if dialysis were to be continued in the outpatient setting -family refuses LTAC or SNF rehab due to pt prior request to NEVER be placed in any type of facility -Continue high-dose midodrine 3 times daily on dialysis days only -Persistent tachycardia secondary to severe deconditioning has improved with the introduction of beta-blockers -Continue PROM every 4 hours-patient encouraged to perform independent AROM as  well -2/3 order placed for repositioning every 2 hours -2/2: Successful out of bed to chair with initial short-term plan for 45 minutes to 1 hour today -FAMILY THAT WILL PROVIDE CARE FOR PT WILL NEED TO ASSIST WITH CARE OF THIS PT AT THE BEDSIDE BEFORE DC HOME  Stage IV sacral decubitus -Not present on admission -Wound care nurse following and utilizing Santyl and hydrotherapy and now has wound VAC in place -Surgical team consulted.  Current recommendation is against pursuing general anesthesia to undergo extensive surgical procedure-hopefully can be reevaluated in the future if wound does not heal adequately.  As of 1/28 they have nothing further acutely to add but will be following peripherally. [facility] rotational bed  -Continue low-dose oxycodone for pain patient complaining of inadequate pain control at sacral decubitus.  -Order placed for pain assessment every 2 hours with request to document each assessment in the progress notes  -  11/24/2020 after [facility]      11/22/2020                       11/29/2020   12/06/20   Incision (Closed) 09/01/20 Abdomen (Active)  Date First Assessed/Time First Assessed: 09/01/20 1737   Location: Abdomen    Assessments 09/01/2020  6:25 PM 12/07/2020  8:13 AM  Dressing Type Gauze (Comment) Moist to dry  Dressing -- Dry;Clean;Intact  Dressing Change Frequency -- Daily  Site / Wound Assessment Dressing in place / Unable to assess --  Drainage Amount None --     No Linked orders to display     Pressure Injury 09/17/20 Ear Left;Anterior;Posterior Stage 2 -  Partial thickness loss of dermis presenting as a shallow open injury with a red, pink wound bed without slough. (Active)  Date First Assessed/Time First Assessed: 09/17/20 0800   Location: Ear  Location Orientation: Left;Anterior;Posterior  Staging: Stage 2 -  Partial thickness loss of dermis presenting as a shallow open injury with a red, pink wound bed without slough. ...    Assessments 09/17/2020  8:00 AM  12/05/2020  8:00 PM  Dressing Type Foam - Lift dressing to assess site every shift None  Dressing Clean;Dry;Intact --  Dressing Change Frequency Every 3 days --  Site / Wound Assessment Dry;Pink --  Peri-wound Assessment Intact --  Wound Length (cm) 2 cm --  Wound Width (cm) 1 cm --  Wound Depth (cm) 0.25 cm --  Wound Surface Area (cm^2) 2 cm^2 --  Wound Volume (cm^3) 0.5 cm^3 --  Margins Unattached edges (unapproximated) --  Drainage Amount Scant --  Drainage Description Serosanguineous --  Treatment Cleansed --     No Linked orders to display     Pressure Injury 09/25/20 Sacrum Bilateral;Medial Deep Tissue Pressure Injury - Purple or maroon localized area of discolored intact skin or blood-filled blister due to damage of underlying soft tissue from pressure and/or shear. Purple, non-blanchable, b (Active)  Date First Assessed/Time First Assessed: 09/25/20 2000   Location: Sacrum  Location Orientation: Bilateral;Medial  Staging: Deep Tissue  Pressure Injury - Purple or maroon localized area of discolored intact skin or blood-filled blister due to damage o...    Assessments 09/25/2020  5:00 PM 12/08/2020 12:45 PM  Dressing Type Foam - Lift dressing to assess site every shift Barrier Film (skin prep);Negative pressure wound therapy  Dressing Changed;Clean;Dry;Intact Changed  Dressing Change Frequency -- Monday, Wednesday, Friday  State of Healing -- Early/partial granulation  Site / Wound Assessment -- Black;Yellow;Pink;Red;Granulation tissue  % Wound base Red or Granulating -- 40%  % Wound base Yellow/Fibrinous Exudate -- 50%  % Wound base Black/Eschar -- 10%  Peri-wound Assessment -- Intact  Margins -- Unattached edges (unapproximated)  Drainage Amount -- Minimal  Drainage Description -- Sanguineous  Treatment -- Debridement (Selective);Negative pressure wound therapy     No Linked orders to display     Negative Pressure Wound Therapy Sacrum (Active)  Placement Date/Time: 11/22/20  1500   Wound Type: (c) Other (Comment)  Location: Sacrum    Assessments 11/22/2020  4:59 PM 12/08/2020 12:45 PM  Last dressing change 11/22/20 12/08/20  Site / Wound Assessment Granulation tissue;Pale;Yellow Pink;Red;Yellow;Black;Granulation tissue  Peri-wound Assessment Intact Intact  Size see above see above  Wound filler - Black foam 2 2  Wound filler - White foam 0 0  Wound filler - Nonadherent 0 0  Wound filler - Gauze 0 0  Cycle Continuous Continuous  Target Pressure (mmHg) 125 125  Instillation Volume 26 mL 26 mL  Instillation Solution Normal Saline Normal Saline  Instillation Soak Time 10 minutes 10 minutes  Instillation Therapy Time 3.5 hours 3.5 hours  Canister Changed No No  Dressing Status Intact Intact  Drainage Amount None Minimal  Output (mL) -- 100 mL     No Linked orders to display     Other problems: Coag neg staph-1/2 blood cultures positive -dw ID pharmacist- cultures c/w contamination Vancomycin ordered overnight stopped on 2/8  History of stage IV colon cancer -Old colostomy  Acute encephalopathy with ICU delirium -Delirium has resolved and supportive meds of Klonopin and Seroquel have been weaned and discontinued  Hypomagnesemia -Continue oral magnesium -1/21 give 4 g magnesium IV x1 -Follow labs  DVT bilateral posterior tibial veins -Was not present during initial COVID admission -Continue Eliquis for now given severe debility and increased risk for developing recurrent DVT  Hypertension -Having issues with orthostasis requiring midodrine Prior to admission patient was on Norvasc  Dyslipidemia -Prior to admission patient was on Crestor-consider resuming soon once patient can swallow pills whole  Abnormal TSH -Nov 2021 TSH was 0.015-TSH and free T4, T3 normal this admission  Data Reviewed: Basic Metabolic Panel: Recent Labs  Lab 12/04/20 0111 12/05/20 0610 12/06/20 0117 12/07/20 0550 12/08/20 0303  NA 137 138 136 136 136  K 3.3*  3.2* 3.4* 3.4* 3.3*  CL 107 105 107 106 101  CO2 19* 22 19* 19* 23  GLUCOSE 198* 131* 181* 147* 203*  BUN 73* 35* 35* 46* 23  CREATININE 1.24* 0.95 1.03* 0.90 0.95  CALCIUM 9.5 9.1 9.5 10.0 8.8*  PHOS 4.3 3.7 3.7 3.5 2.8   Liver Function Tests: Recent Labs  Lab 12/04/20 0111 12/05/20 0610 12/06/20 0117 12/07/20 0550 12/08/20 0303  ALBUMIN 2.1* 2.1* 2.1* 2.0* 2.2*   No results for input(s): LIPASE, AMYLASE in the last 168 hours. No results for input(s): AMMONIA in the last 168 hours. CBC: Recent Labs  Lab 12/04/20 0819 12/06/20 0117 12/07/20 0550 12/08/20 0303  WBC 16.6* 13.7* 17.2* 17.5*  NEUTROABS  --  9.9* 12.1* 12.9*  HGB 7.5* 7.7* 7.4* 7.8*  HCT 24.2* 26.2* 25.2* 24.8*  MCV 84.9 87.6 86.9 86.1  PLT 417* 355 395 393   Cardiac Enzymes: No results for input(s): CKTOTAL, CKMB, CKMBINDEX, TROPONINI in the last 168 hours. BNP (last 3 results) Recent Labs    09/07/20 0118 09/08/20 0446 09/09/20 0428  BNP 216.8* 432.5* 609.7*    ProBNP (last 3 results) No results for input(s): PROBNP in the last 8760 hours.  CBG: Recent Labs  Lab 12/08/20 0825 12/08/20 1238 12/08/20 1649 12/08/20 2034 12/09/20 0000  GLUCAP 193* 200* 205* 159* 186*    Recent Results (from the past 240 hour(s))  Culture, blood (routine x 2)     Status: Abnormal   Collection Time: 12/05/20 11:07 AM   Specimen: BLOOD RIGHT ARM  Result Value Ref Range Status   Specimen Description BLOOD RIGHT ARM  Final   Special Requests   Final    BOTTLES DRAWN AEROBIC ONLY Blood Culture adequate volume   Culture  Setup Time   Final    GRAM POSITIVE COCCI IN CLUSTERS AEROBIC BOTTLE ONLY CRITICAL RESULT CALLED TO, READ BACK BY AND VERIFIED WITH: Tillman Sers PHARMD 2000 12/06/20 A BROWNING    Culture (A)  Final    STAPHYLOCOCCUS EPIDERMIDIS THE SIGNIFICANCE OF ISOLATING THIS ORGANISM FROM A SINGLE SET OF BLOOD CULTURES WHEN MULTIPLE SETS ARE DRAWN IS UNCERTAIN. PLEASE NOTIFY THE MICROBIOLOGY DEPARTMENT  WITHIN ONE WEEK IF SPECIATION AND SENSITIVITIES ARE REQUIRED. Performed at [facility], [place] [address] [place], [place] [address]    Report Status 12/08/2020 FINAL  Final  Culture, blood (routine x 2)     Status: None (Preliminary result)   Collection Time: 12/05/20 11:07 AM   Specimen: BLOOD LEFT HAND  Result Value Ref Range Status   Specimen Description BLOOD LEFT HAND  Final   Special Requests   Final    BOTTLES DRAWN AEROBIC ONLY Blood Culture adequate volume   Culture   Final    NO GROWTH 3 DAYS Performed at [facility], [place] [address], [place], [place] [address]    Report Status PENDING  Incomplete  Blood Culture ID Panel (Reflexed)     Status: Abnormal   Collection Time: 12/05/20 11:07 AM  Result Value Ref Range Status   Enterococcus faecalis NOT DETECTED NOT DETECTED Final   Enterococcus Faecium NOT DETECTED NOT DETECTED Final   Listeria monocytogenes NOT DETECTED NOT DETECTED Final   Staphylococcus species DETECTED (A) NOT DETECTED Final    Comment: CRITICAL RESULT CALLED TO, READ BACK BY AND VERIFIED WITH: Tillman Sers PHARMD 2000 12/06/20 A BROWNING    Staphylococcus aureus (BCID) NOT DETECTED NOT DETECTED Final   Staphylococcus epidermidis DETECTED (A) NOT DETECTED Final    Comment: Methicillin (oxacillin) resistant coagulase negative staphylococcus. Possible blood culture contaminant (unless isolated from more than one blood culture draw or clinical case suggests pathogenicity). No antibiotic treatment is indicated for blood  culture contaminants. CRITICAL RESULT CALLED TO, READ BACK BY AND VERIFIED WITH: Tillman Sers PHARMD 2000 12/06/20 A BROWNING    Staphylococcus lugdunensis NOT DETECTED NOT DETECTED Final   Streptococcus species NOT DETECTED NOT DETECTED Final   Streptococcus agalactiae NOT DETECTED NOT DETECTED Final   Streptococcus pneumoniae NOT DETECTED NOT DETECTED Final   Streptococcus pyogenes NOT DETECTED NOT DETECTED Final    A.calcoaceticus-baumannii NOT DETECTED NOT DETECTED Final   Bacteroides fragilis NOT DETECTED NOT DETECTED Final   Enterobacterales NOT DETECTED NOT DETECTED  Final   Enterobacter cloacae complex NOT DETECTED NOT DETECTED Final   Escherichia coli NOT DETECTED NOT DETECTED Final   Klebsiella aerogenes NOT DETECTED NOT DETECTED Final   Klebsiella oxytoca NOT DETECTED NOT DETECTED Final   Klebsiella pneumoniae NOT DETECTED NOT DETECTED Final   Proteus species NOT DETECTED NOT DETECTED Final   Salmonella species NOT DETECTED NOT DETECTED Final   Serratia marcescens NOT DETECTED NOT DETECTED Final   Haemophilus influenzae NOT DETECTED NOT DETECTED Final   Neisseria meningitidis NOT DETECTED NOT DETECTED Final   Pseudomonas aeruginosa NOT DETECTED NOT DETECTED Final   Stenotrophomonas maltophilia NOT DETECTED NOT DETECTED Final   Candida albicans NOT DETECTED NOT DETECTED Final   Candida auris NOT DETECTED NOT DETECTED Final   Candida glabrata NOT DETECTED NOT DETECTED Final   Candida krusei NOT DETECTED NOT DETECTED Final   Candida parapsilosis NOT DETECTED NOT DETECTED Final   Candida tropicalis NOT DETECTED NOT DETECTED Final   Cryptococcus neoformans/gattii NOT DETECTED NOT DETECTED Final   Methicillin resistance mecA/C DETECTED (A) NOT DETECTED Final    Comment: CRITICAL RESULT CALLED TO, READ BACK BY AND VERIFIED WITH: Kelby Fam PHARMD 2000 12/06/20 A BROWNING Performed at [facility], [address], [place], [place] [address]      Studies: DG Chest Port 1 View  Result Date: 12/08/2020 CLINICAL DATA:  Abnormal respiration. EXAM: PORTABLE CHEST 1 VIEW COMPARISON:  November 27, 2020. FINDINGS: Stable cardiomegaly. No pneumothorax is noted. Tracheostomy and feeding tubes are unchanged in position. Right internal jugular catheter is noted with tip now noted in right atrium. Right-sided PICC line is unchanged. Stable bibasilar atelectasis or infiltrates are noted. Bony thorax is  unremarkable. IMPRESSION: Stable support apparatus. Right internal jugular catheter is noted with tip now noted in right atrium. Stable bibasilar atelectasis or infiltrates. Electronically Signed   By: Lupita Raider M.D.   On: 12/08/2020 08:14    Scheduled Meds: . acetaminophen (TYLENOL) oral liquid 160 mg/5 mL  650 mg Oral Q6H  . apixaban  2.5 mg Oral BID  . chlorhexidine gluconate (MEDLINE KIT)  15 mL Mouth Rinse BID  . Chlorhexidine Gluconate Cloth  6 each Topical Daily  . Chlorhexidine Gluconate Cloth  6 each Topical Q0600  . Chlorhexidine Gluconate Cloth  6 each Topical Q0600  . cholecalciferol  2,000 Units Oral Daily  . collagenase   Topical BID  . darbepoetin (ARANESP) injection - DIALYSIS  100 mcg Intravenous Q Sat-HD  . feeding supplement (NEPRO CARB STEADY)  237 mL Oral TID BM  . feeding supplement (NEPRO CARB STEADY)  780 mL Per Tube Q24H  . feeding supplement (PROSource TF)  45 mL Per Tube TID  . guaiFENesin  15 mL Oral Q12H  . insulin aspart  0-6 Units Subcutaneous TID WC  . mouth rinse  15 mL Mouth Rinse QID  . megestrol  200 mg Oral QID  . metoprolol tartrate  50 mg Oral BID  . midodrine  40 mg Oral 3 times per day on Tue Thu Sat  . multivitamin  1 tablet Oral QHS  . nutrition supplement (JUVEN)  1 packet Per Tube BID BM  . nystatin   Topical BID  . pantoprazole  40 mg Oral Daily  . sodium chloride flush  10-40 mL Intracatheter Q12H   Continuous Infusions: . albumin human 25 g (11/16/20 1516)    Principal Problem:   Atrial fibrillation with RVR (HCC) Active Problems:   Acute respiratory failure due to  COVID-19 ([place])   New onset a-fib (HCC)   Leukocytosis   Atrial fibrillation with rapid ventricular response (HCC)   Hypoxia   Pressure injury of skin   Acute hypoxemic respiratory failure (HCC)   ARDS (adult respiratory distress syndrome) (HCC)   Perforated duodenal ulcer ([place])   On mechanically assisted ventilation ([place])   Palliative care by specialist    Goals of care, counseling/discussion   Shock ([place])   Acute and chronic respiratory failure (acute-on-chronic) ([place])   Status post tracheostomy ([place])   ESRD (end stage renal disease) ([place])   HAP (hospital-acquired pneumonia)   Consultants:  Cardiology  Surgery  Nephrology  Ethics  PCCM   Procedures: R PICC 11/5 >> A line 11/9 >> out ETT 11/9 > 11/16, 11/16 >> 09/21/2020, 09/21/2020 tracheostomy>> Lt [place] CVL 11/9 >> R IJ trialysis >> out HD catheter 12/1 >>12/20 12/21 - 14.5 Fr, 23 cm right IJ tunneled hemodialysis catheter placement. Removal of indwelling subclavian catheter.   Antibiotics: Anti-infectives (From admission, onward)   Start     Dose/Rate Route Frequency Ordered Stop   12/07/20 1200  vancomycin (VANCOCIN) IVPB 1000 mg/200 mL premix  Status:  Discontinued        1,000 mg 200 mL/hr over 60 Minutes Intravenous Every T-Th-Sa (Hemodialysis) 12/06/20 2156 12/07/20 1113   12/06/20 2245  vancomycin (VANCOREADY) IVPB 2000 mg/400 mL        2,000 mg 200 mL/hr over 120 Minutes Intravenous  Once 12/06/20 2156 12/07/20 0211   11/27/20 2000  ceFAZolin (ANCEF) IVPB 1 g/50 mL premix        1 g 100 mL/hr over 30 Minutes Intravenous Every 24 hours 11/26/20 1248 12/03/20 2136   11/26/20 1345  ceFAZolin (ANCEF) IVPB 1 g/50 mL premix        1 g 100 mL/hr over 30 Minutes Intravenous  Once 11/26/20 1248 11/26/20 1433   11/17/20 1415  fluconazole (DIFLUCAN) 40 MG/ML suspension 200 mg        200 mg Oral  Once 11/17/20 1315 11/17/20 1357   11/03/20 2200  meropenem (MERREM) 500 mg in sodium chloride 0.9 % 100 mL IVPB        500 mg 200 mL/hr over 30 Minutes Intravenous Every 24 hours 11/03/20 1500 11/06/20 2217   11/01/20 1000  anidulafungin (ERAXIS) 100 mg in sodium chloride 0.9 % 100 mL IVPB  Status:  Discontinued       "Followed by" Linked Group Details   100 mg 78 mL/hr over 100 Minutes Intravenous Every 24 hours 10/31/20 0916 11/01/20 1404   10/31/20 1015  meropenem  (MERREM) 1 g in sodium chloride 0.9 % 100 mL IVPB  Status:  Discontinued        1 g 200 mL/hr over 30 Minutes Intravenous Every 8 hours 10/31/20 0916 11/03/20 1500   10/31/20 1015  linezolid (ZYVOX) IVPB 600 mg  Status:  Discontinued        600 mg 300 mL/hr over 60 Minutes Intravenous Every 12 hours 10/31/20 0916 11/02/20 0906   10/31/20 1015  anidulafungin (ERAXIS) 200 mg in sodium chloride 0.9 % 200 mL IVPB       "Followed by" Linked Group Details   200 mg 78 mL/hr over 200 Minutes Intravenous  Once 10/31/20 0916 10/31/20 1630   10/25/20 1800  ceFAZolin (ANCEF) IVPB 2g/100 mL premix  Status:  Discontinued        2 g 200 mL/hr over 30 Minutes Intravenous Every 12 hours 10/25/20 1022 10/30/20 1103  10/23/20 2000  vancomycin (VANCOREADY) IVPB 750 mg/150 mL  Status:  Discontinued        750 mg 150 mL/hr over 60 Minutes Intravenous Every 24 hours 10/22/20 2036 10/25/20 1022   10/23/20 1800  piperacillin-tazobactam (ZOSYN) IVPB 3.375 g  Status:  Discontinued        3.375 g 100 mL/hr over 30 Minutes Intravenous Every 6 hours 10/23/20 1155 10/24/20 1422   10/23/20 0200  piperacillin-tazobactam (ZOSYN) IVPB 3.375 g  Status:  Discontinued        3.375 g 100 mL/hr over 30 Minutes Intravenous Every 8 hours 10/22/20 2036 10/23/20 1155   10/22/20 1630  vancomycin (VANCOREADY) IVPB 1500 mg/300 mL        1,500 mg 150 mL/hr over 120 Minutes Intravenous  Once 10/22/20 1537 10/22/20 2229   10/22/20 1630  piperacillin-tazobactam (ZOSYN) IVPB 2.25 g  Status:  Discontinued        2.25 g 100 mL/hr over 30 Minutes Intravenous Every 8 hours 10/22/20 1537 10/22/20 2036   10/22/20 1537  vancomycin variable dose per unstable renal function (pharmacist dosing)  Status:  Discontinued         Does not apply See admin instructions 10/22/20 1537 10/22/20 2036   10/19/20 0600  ceFAZolin (ANCEF) IVPB 2g/100 mL premix        2 g 200 mL/hr over 30 Minutes Intravenous To Radiology 10/18/20 1457 10/19/20 0938    10/10/20 0830  vancomycin (VANCOCIN) IVPB 1000 mg/200 mL premix       "Followed by" Linked Group Details   1,000 mg 200 mL/hr over 60 Minutes Intravenous Every 24 hours 10/09/20 0744 10/15/20 0902   10/09/20 0830  piperacillin-tazobactam (ZOSYN) IVPB 3.375 g        3.375 g 100 mL/hr over 30 Minutes Intravenous Every 6 hours 10/09/20 0744 10/16/20 0038   10/09/20 0830  vancomycin (VANCOREADY) IVPB 2000 mg/400 mL       "Followed by" Linked Group Details   2,000 mg 200 mL/hr over 120 Minutes Intravenous  Once 10/09/20 0744 10/09/20 1022   09/24/20 1000  ceFAZolin (ANCEF) IVPB 2g/100 mL premix  Status:  Discontinued        2 g 200 mL/hr over 30 Minutes Intravenous Every 12 hours 09/24/20 0801 09/24/20 1046   09/23/20 1400  vancomycin (VANCOCIN) IVPB 1000 mg/200 mL premix        1,000 mg 200 mL/hr over 60 Minutes Intravenous Every 24 hours 09/22/20 1436 09/28/20 1718   09/22/20 2200  ceFEPIme (MAXIPIME) 2 g in sodium chloride 0.9 % 100 mL IVPB  Status:  Discontinued        2 g 200 mL/hr over 30 Minutes Intravenous Every 12 hours 09/22/20 1436 09/24/20 0801   09/22/20 1030  ceFEPIme (MAXIPIME) 1 g in sodium chloride 0.9 % 100 mL IVPB        1 g 200 mL/hr over 30 Minutes Intravenous  Once 09/22/20 0934 09/22/20 1145   09/22/20 1015  vancomycin (VANCOCIN) IVPB 1000 mg/200 mL premix        1,000 mg 200 mL/hr over 60 Minutes Intravenous  Once 09/22/20 0934 09/22/20 1446   09/12/20 2200  ceFEPIme (MAXIPIME) 2 g in sodium chloride 0.9 % 100 mL IVPB        2 g 200 mL/hr over 30 Minutes Intravenous Every 12 hours 09/12/20 0732 09/14/20 2134   09/11/20 1400  ceFEPIme (MAXIPIME) 2 g in sodium chloride 0.9 % 100 mL IVPB  Status:  Discontinued  2 g 200 mL/hr over 30 Minutes Intravenous Every 8 hours 09/11/20 1126 09/12/20 0732   09/08/20 1000  vancomycin (VANCOREADY) IVPB 2000 mg/400 mL        2,000 mg 200 mL/hr over 120 Minutes Intravenous  Once 09/08/20 0857 09/08/20 1224   09/08/20 1000   ceFEPIme (MAXIPIME) 2 g in sodium chloride 0.9 % 100 mL IVPB  Status:  Discontinued        2 g 200 mL/hr over 30 Minutes Intravenous Every 12 hours 09/08/20 0857 09/11/20 1126   09/08/20 0856  vancomycin variable dose per unstable renal function (pharmacist dosing)  Status:  Discontinued         Does not apply See admin instructions 09/08/20 0857 09/09/20 0935   09/02/20 1600  cefTRIAXone (ROCEPHIN) 1 g in sodium chloride 0.9 % 100 mL IVPB  Status:  Discontinued        1 g 200 mL/hr over 30 Minutes Intravenous Every 24 hours 09/01/20 1811 09/02/20 0838   09/01/20 1800  fluconazole (DIFLUCAN) IVPB 400 mg        400 mg 50 mL/hr over 240 Minutes Intravenous  Once 09/01/20 1749 09/02/20 0603   09/01/20 1530  piperacillin-tazobactam (ZOSYN) IVPB 3.375 g        3.375 g 12.5 mL/hr over 240 Minutes Intravenous Every 8 hours 09/01/20 1514 09/05/20 2111   09/01/20 1000  levofloxacin (LEVAQUIN) tablet 250 mg  Status:  Discontinued        250 mg Oral Daily 08/31/20 1508 08/31/20 1735   08/31/20 1730  cefTRIAXone (ROCEPHIN) 1 g in sodium chloride 0.9 % 100 mL IVPB  Status:  Discontinued        1 g 200 mL/hr over 30 Minutes Intravenous Every 24 hours 08/31/20 1726 09/01/20 1513   08/31/20 1730  azithromycin (ZITHROMAX) 500 mg in sodium chloride 0.9 % 250 mL IVPB  Status:  Discontinued        500 mg 250 mL/hr over 60 Minutes Intravenous Every 24 hours 08/31/20 1726 09/02/20 0838       Time spent: 30 minutes    [name] ANP  [facility] 7 am - 330 pm/M-F for direct patient care and secure chat Please refer to Amion for contact info 99  days

## 2020-12-09 NOTE — Progress Notes (Signed)
SLP Cancellation Note  Patient Details Name: Candace [name] MRN: [id number] DOB: [birth year]   Cancelled treatment:       Reason Eval/Treat Not Completed: Patient at procedure or test/unavailable (Pt off unit for HD. SLP will f/u)  Tobie Poet I. Hardin Negus, [place], [place] Office number [phone] Pager [place] 12/09/2020, 8:51 AM

## 2020-12-09 NOTE — Progress Notes (Addendum)
Pts daughter called @2323 . Wanted update on Pt.  @2330  tube feeding was started . Pt was repositioned and was given sips of water. Pt tolerated well and stated she did not need anything at the given moment.

## 2020-12-09 NOTE — Progress Notes (Signed)
Following today's reported event regarding interactions with staff and patient/family nursing leadership will began buddy system going forward and strongly suggest that the bedside team do the same when possible. This will promote support for care team, any care needed for the patient, and accountability among the health care team. Department leadership will follow up with patient and family in the morning regarding a previous conversation to capture documentation of individualized care any other concerns.

## 2020-12-10 DIAGNOSIS — I4891 Unspecified atrial fibrillation: Secondary | ICD-10-CM | POA: Diagnosis not present

## 2020-12-10 DIAGNOSIS — Z930 Tracheostomy status: Secondary | ICD-10-CM | POA: Diagnosis not present

## 2020-12-10 LAB — GLUCOSE, CAPILLARY
Glucose-Capillary: 172 mg/dL — ABNORMAL HIGH (ref 70–99)
Glucose-Capillary: 204 mg/dL — ABNORMAL HIGH (ref 70–99)
Glucose-Capillary: 231 mg/dL — ABNORMAL HIGH (ref 70–99)
Glucose-Capillary: 232 mg/dL — ABNORMAL HIGH (ref 70–99)
Glucose-Capillary: 241 mg/dL — ABNORMAL HIGH (ref 70–99)
Glucose-Capillary: 248 mg/dL — ABNORMAL HIGH (ref 70–99)

## 2020-12-10 LAB — VITAMIN B12: Vitamin B-12: 424 pg/mL (ref 180–914)

## 2020-12-10 LAB — CULTURE, BLOOD (ROUTINE X 2)
Culture: NO GROWTH
Special Requests: ADEQUATE

## 2020-12-10 MED ORDER — CHLORHEXIDINE GLUCONATE CLOTH 2 % EX PADS
6.0000 | MEDICATED_PAD | Freq: Every day | CUTANEOUS | Status: DC
  Administered 2020-12-10 – 2020-12-22 (×12): 6 via TOPICAL

## 2020-12-10 MED ORDER — METOPROLOL TARTRATE 25 MG PO TABS
37.5000 mg | ORAL_TABLET | Freq: Two times a day (BID) | ORAL | Status: DC
  Administered 2020-12-10 – 2020-12-23 (×25): 37.5 mg via ORAL
  Filled 2020-12-10 (×27): qty 2

## 2020-12-10 NOTE — Progress Notes (Signed)
Occupational Therapy Treatment Patient Details Name: Candace [name] MRN: [id number] DOB: 29-Apr-1951 Today's Date: 12/10/2020    History of present illness Pt 70 y.o. female with medical history significant for recent covid pna, obesity, colon cancer s/p colon resection with colostomy bag, HLD, NIDDM2, CKD3 presented to ED after her follow-up nurse advised her to present to ED for elevated HR. +afib, elevated troponins with demand ischemia,  CT scan is positive for bowel perforation with pneumomediastinum 11/03 exp lap with repair of perforated ulcer. 11/10 t/f to ICU- intubated/sedated/proned. Trach placed 11/24. CRRT off 12/8, restarted 12/10-1/5. 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   OT comments  Pt making steady progress towards OT goals this session. Session focus on hand strength and digit coordination exercises, as well as problem solving being able to write with a pen. Pt supine in bed upon arrival agreeable to OT intervention. Pt completed therex as indicated below with level one theraputty and squeeze ball to facilitate increased AROM and [place] for higher level ADLs. Pts daughter enter mid session and reports that pt has been using the u cuff for oral care. Encouraged pt to completed therex 3x daily, maybe after meals, pt and daughter in agreement. Additionally issued pt built up foam to be used with pens to work on handwriting per pts request. Pt noted to verbalize needs more this session asking to be repositioned off of buttock, MAX A +2 to roll R<>L for repositioning. Pt would continue to benefit from skilled occupational therapy while admitted and after d/c to address the below listed limitations in order to improve overall functional mobility and facilitate independence with BADL participation. DC plan remains appropriate, will follow acutely per POC.     Follow Up Recommendations   LTACH;SNF;Supervision/Assistance - 24 hour;Other (comment) (HHOT if pt/family still decline SNF)    Equipment Recommendations  Wheelchair (measurements OT);Wheelchair cushion (measurements OT);Hospital bed;Other (comment) (tilt in space power chair, air mattress and air cusion)    Recommendations for Other Services      Precautions / Restrictions Precautions Precautions: Fall Precaution Comments: baseline R colostomy; sacral wound with wound vac, trach with PMV Restrictions Weight Bearing Restrictions: No       Mobility Bed Mobility Overal bed mobility: Needs Assistance Bed Mobility: Rolling Rolling: Max assist;+2 for physical assistance         General bed mobility comments: MAX A +2 to roll to reposition in bed  Transfers                 General transfer comment: NT    Balance                                           ADL either performed or assessed with clinical judgement   ADL                                       Functional mobility during ADLs: Maximal assistance;+2 for physical assistance (bed mobility for repositioning) General ADL Comments: pts dtr reports that pt uses ucuff to brush her teeth, session focus on [facility] therex with putty and increase functional grip strength with squeeze ball, additionally brought pt built up foam and pens to work on Estate agent,  per pt request     Vision       Perception     Praxis      Cognition Arousal/Alertness: Awake/alert Behavior During Therapy: WFL for tasks assessed/performed Overall Cognitive Status: Difficult to assess                                 General Comments: did not formally assess cog, however pt following commands and attentive to sesssion, slightly slow to process in terms of following directions related to HEP. pt able to verbalize needs such as speaking up when needing to be turned in bed        Exercises Hand Exercises Digit Composite  Flexion: AROM;Both;10 reps;Squeeze ball Composite Extension: AROM;Both;10 reps;Squeeze ball Other Exercises Other Exercises: level 1 theraputty issued with written HEP including exercises such as pincer grasp, digit flexion/ extension, scapular protraction/ retractions, opposition, wrist flexion/ extension   Shoulder Instructions       General Comments pts daughter enter at end of session, Pt HR maintaining in 120s during sessio    Pertinent Vitals/ Pain       Pain Assessment: Faces Faces Pain Scale: Hurts even more Pain Location: buttock with repositioning Pain Descriptors / Indicators: Grimacing Pain Intervention(s): Monitored during session;Limited activity within patient's tolerance;Repositioned;Premedicated before session  Home Living                                          Prior Functioning/Environment              Frequency  Min 2X/week        Progress Toward Goals  OT Goals(current goals can now be found in the care plan section)  Progress towards OT goals: Progressing toward goals  Acute Rehab OT Goals Patient Stated Goal: to eat breakfast OT Goal Formulation: With patient/family Time For Goal Achievement: 12/20/20 Potential to Achieve Goals: [place] Discharge plan remains appropriate;Frequency remains appropriate    Co-evaluation                 AM-PAC OT "6 Clicks" Daily Activity     Outcome Measure   Help from another person eating meals?: A Little Help from another person taking care of personal grooming?: A Little Help from another person toileting, which includes using toliet, bedpan, or urinal?: Total Help from another person bathing (including washing, rinsing, drying)?: A Lot Help from another person to put on and taking off regular upper body clothing?: A Lot Help from another person to put on and taking off regular lower body clothing?: Total 6 Click Score: 12    End of Session    OT Visit Diagnosis:  Unsteadiness on feet (R26.81);Other abnormalities of gait and mobility (R26.89);Muscle weakness (generalized) (M62.81);Other symptoms and signs involving cognitive function   Activity Tolerance Patient tolerated treatment well   Patient Left in bed;with call bell/phone within reach;with family/visitor present;with nursing/sitter in room   Nurse Communication Mobility status        Time: 9509-3267 OT Time Calculation (min): 23 min  Charges: OT General Charges $OT Visit: 1 Visit OT Treatments $Self Care/Home Management : 8-22 mins $Therapeutic Exercise: 8-22 mins  Harley Alto., COTA/L Acute Rehabilitation Services [phone]    Candace [name] 12/10/2020, 9:03 AM

## 2020-12-10 NOTE — Progress Notes (Signed)
Pt has been checked on an hourly basis.   Starting at 0400. Myself and [place], bathed pt. Changed gown and bedding . Emptied colostomy bag. Pt stated her pain was a 0/10. Pt refused any type of pain meds.  Refused Tylenol. Pt was turned and repositioned to her liking. Pt was left in a comfortable state with call light at her reach. Pt was asked if she had concerns, questions or needs that needed to met in which she replied that she did not.

## 2020-12-10 NOTE — Progress Notes (Addendum)
Per Pts request myself and tech [place], repositioned her. Warm blanket given and call light at her reach. Pt stated all needs met

## 2020-12-10 NOTE — Progress Notes (Signed)
@   [place] with pts daughter, advised pt was comfortable and asleep.

## 2020-12-10 NOTE — TOC Progression Note (Signed)
Transition of Care [facility]) - Progression Note    Patient Details  Name: Candace [name] MRN: [id number] Date of Birth: 05-10-1951  Transition of Care [facility]) CM/SW [place] Dezza, RN Phone Number: 12/10/2020, 8:15 AM  Clinical Narrative:    The patient's daughter, [name], called to speak to me regarding her concerns over the patient's care.  Madilyn Fireman, CSW and I both met with the patient, [name] and Marlowe Kays (on phone) and provided an opportunity to listen to daughter's concerns and offer solutions to facilitate better communication between the patient's family and staff. I explained that after hearing daughter's concerns over nursing care at the bedside I would speak to [place], Retail buyer regarding the matter so that she can follow up with the patient's family.  I also gave the daughter the number to the hospital operator to be transferred to Patient experience and speak to a patient advocate regarding the patient care concerns.  Daughter agreed to call the advocate.  I updated [name], NP on the daughters and patient's present concerns.  CM and MSW will continue to follow the patient for transitions of care.   Expected Discharge Plan: [place] Barriers to Discharge: Continued Medical Work up  Expected Discharge Plan and Services Expected Discharge Plan: [place] In-house Referral: Clinical Social Work Discharge Planning Services: CM Consult Post Acute Care Choice: Durable Medical Equipment,Home Health,Dialysis Living arrangements for the past 2 months: Single Family Home                 DME Arranged: N/A DME Agency: NA       HH Arranged: NA HH Agency: NA         Social Determinants of Health (SDOH) Interventions    Readmission Risk Interventions Readmission Risk Prevention Plan 11/19/2020  Transportation Screening Complete  Medication Review Press photographer) Complete  PCP or Specialist appointment within 3-5  days of discharge Complete  HRI or Home Care Consult Complete  SW Recovery Care/Counseling Consult Complete  Palliative Care Screening Complete  Skilled Nursing Facility Complete  Some recent data might be hidden

## 2020-12-10 NOTE — TOC Progression Note (Signed)
Transition of Care [facility]) - Progression Note    Patient Details  Name: Candace [name] MRN: [id number] Date of Birth: July 08, 1951  Transition of Care [facility]) CM/SW [place], RN Phone Number: 12/10/2020, 1:24 PM  Clinical Narrative:    Case management met with the patient and daughter, [name], at the bedside regarding transitions of care.  The daughter, Rosanne Ashing that she is dissatisfied with the nursing care that her mother is receiving on the unit.  [name] and Madilyn Fireman were at the bedside during this discussion with the daughter.  I offered to the daughter to follow up with Patient experience and she was unable to reach a patient care advocate but was offered a conversation with the charge nurse on the unit and the hospital [facility].  When I called Patient care experience line myself - I received the same message as the daughter.  I spoke with Aldona Bar, Horticulturist, commercial, and she will speak with Charlann Boxer, the director to see if the patient is able to transfer to an alternate progressive nursing care unit so that the patient and family can have the ability to receive requested care.  The daughter, [name], expressed the need to "be comfortable with nursing care at night when she leaves for home."  The daughter states that she is unable to sleep because she is worried about her mother's care.  Nursing directors on [facility] are aware of the patient's daughters complaints and are diligently working on a solution for patient care measures.  I spoke with Dr. Eliseo Squires and she asked that I speak with [facility] to see if they are able to speak with the daughter about LTAC and offer a tour to the family if the patient meets admission criteria if patient's family is agreeable.  I called and spoke with Anderson Malta, [place] with [facility] and they are unable to offer the patient a bed until the patient is able to sit up in a chair for HD sessions.  Until this time, the  facility is not able to offer an admission bed to the patient for rehabilitation.  I called [name], the patient's daughter and updated her about discussion with nursing leadership and LTAC conversation with Anderson Malta, CM at the facility.  CM and MSW will continue to follow the patient for transitions of care.  Expected Discharge Plan: [place] Barriers to Discharge: Continued Medical Work up  Expected Discharge Plan and Services Expected Discharge Plan: [place] In-house Referral: Clinical Social Work Discharge Planning Services: CM Consult Post Acute Care Choice: Durable Medical Equipment,Home Health,Dialysis Living arrangements for the past 2 months: Single Family Home                 DME Arranged: N/A DME Agency: NA       HH Arranged: NA HH Agency: NA         Social Determinants of Health (SDOH) Interventions    Readmission Risk Interventions Readmission Risk Prevention Plan 11/19/2020  Transportation Screening Complete  Medication Review Press photographer) Complete  PCP or Specialist appointment within 3-5 days of discharge Complete  HRI or Home Care Consult Complete  SW Recovery Care/Counseling Consult Complete  Palliative Care Screening Complete  Skilled Nursing Facility Complete  Some recent data might be hidden

## 2020-12-10 NOTE — Progress Notes (Signed)
Productive and transparent conversation had with both daughters [name] and [name] this evening. Events of yesterday discussed with clarity to what led up to the break down. Leadership expressed concern regarding all involved (family, staff, and the patient). We discussed the intent of staff and family presence. Leadership took and extra step to review the entire course of this stay to have a better understanding of where the family is coming from. We discussed that a possible lateral transfer may occur to reset the tone, but we all are committed to a better collaborative environment in the mean time wether this occurs or not. Family advise who to contact anytime there is a concern that cannot be addressed by the primary nurse starting with Department Leadership.

## 2020-12-10 NOTE — Progress Notes (Signed)
[facility] NEPHROLOGY PROGRESS NOTE  Assessment/ Plan:  # End-stage renal disease from prolonged dialysis dependent acute kidney Injury: Suspected to be multifactorial acute kidney injury from septic shock/MRSA pneumonia and hemorrhagic shock at the outside.  Has been on renal replacement therapy since 09/08/2020 and recently able to transition to intermittent hemodialysis from CRRT.  Intermittent hemodialysis has been challenging due to hypotension and positional discomfort from decubitus ulcer.  Creatinine remains misleadingly low and is indicative of significant lean muscle mass loss with prolonged hospitalization; urine output incompletely charted.  She is not a candidate for CIR and family have no desire to have her admitted to SNF or LTAC and intend to take her home upon discharge.  Continue TTS dialysis while here. Last HD on 2/10 with 600 cc UF, next HD tomorrow.  Minimize lab draws.  # History of Covid pneumonia with ARDS: Status post tracheostomy and respiratory status appears to be stable with supplementation via T-bar.  # Atrial fibrillation with rapid ventricular response: on metoprolol, tachycardic.  On anticoagulation with Eliquis.  # Severe protein calorie malnutrition with large sacral decubitus ulcer: Ongoing nutritional supplementation with wound care.  # CKD-MBD: Calcium and phosphorus level acceptable.  Continue monitor.  # Anemia: Complicated by GI bleed from perforated duodenal ulcer status post exploratory laparotomy with Phillip Heal patch placement.    Receiving IV iron and ESA.  Monitor hemoglobin.  #Hypokalemia: Dialyzing with high potassium bath.  Monitor lab.  Subjective: Seen and examined.  She looks comfortable, alert awake and following commands.  No new event. Objective Vital signs in last 24 hours: Vitals:   12/10/20 0400 12/10/20 0406 12/10/20 0729 12/10/20 0817  BP:   116/66 116/66  Pulse:  (!) 109  (!) 116  Resp:  20 20 20   Temp:  97.9 F  (36.6 C) 99.3 F (37.4 C)   TempSrc:  Oral Oral   SpO2: 98% 100% 100% 100%  Height:       Weight change:   Intake/Output Summary (Last 24 hours) at 12/10/2020 0849 Last data filed at 12/10/2020 0406 Gross per 24 hour  Intake -  Output 1100 ml  Net -1100 ml       Labs: Basic Metabolic Panel: Recent Labs  Lab 12/07/20 0550 12/08/20 0303 12/09/20 0903  NA 136 136 137  K 3.4* 3.3* 3.1*  CL 106 101 102  CO2 19* 23 23  GLUCOSE 147* 203* 163*  BUN 46* 23 63*  CREATININE 0.90 0.95 1.00  CALCIUM 10.0 8.8* 10.7*  PHOS 3.5 2.8 3.2   Liver Function Tests: Recent Labs  Lab 12/07/20 0550 12/08/20 0303 12/09/20 0903  ALBUMIN 2.0* 2.2* 2.2*   No results for input(s): LIPASE, AMYLASE in the last 168 hours. No results for input(s): AMMONIA in the last 168 hours. CBC: Recent Labs  Lab 12/04/20 0819 12/06/20 0117 12/07/20 0550 12/08/20 0303 12/09/20 0903  WBC 16.6* 13.7* 17.2* 17.5* 16.5*  NEUTROABS  --  9.9* 12.1* 12.9*  --   HGB 7.5* 7.7* 7.4* 7.8* 8.1*  HCT 24.2* 26.2* 25.2* 24.8* 26.8*  MCV 84.9 87.6 86.9 86.1 87.9  PLT 417* 355 395 393 443*   Cardiac Enzymes: No results for input(s): CKTOTAL, CKMB, CKMBINDEX, TROPONINI in the last 168 hours. CBG: Recent Labs  Lab 12/09/20 1546 12/09/20 2041 12/10/20 0015 12/10/20 0405 12/10/20 0735  GLUCAP 170* 203* 172* 231* 248*    Iron Studies: No results for input(s): IRON, TIBC, TRANSFERRIN, FERRITIN in the last 72 hours. Studies/Results: No results  found.  Medications: Infusions: . albumin human 25 g (11/16/20 1516)    Scheduled Medications: . acetaminophen (TYLENOL) oral liquid 160 mg/5 mL  650 mg Oral Q6H  . apixaban  2.5 mg Oral BID  . chlorhexidine gluconate (MEDLINE KIT)  15 mL Mouth Rinse BID  . Chlorhexidine Gluconate Cloth  6 each Topical Daily  . Chlorhexidine Gluconate Cloth  6 each Topical Q0600  . Chlorhexidine Gluconate Cloth  6 each Topical Q0600  . cholecalciferol  2,000 Units Oral Daily   . collagenase   Topical BID  . darbepoetin (ARANESP) injection - DIALYSIS  100 mcg Intravenous Q Sat-HD  . diclofenac Sodium  2 g Topical QID  . feeding supplement (NEPRO CARB STEADY)  237 mL Oral TID BM  . feeding supplement (NEPRO CARB STEADY)  780 mL Per Tube Q24H  . feeding supplement (PROSource TF)  45 mL Per Tube TID  . guaiFENesin  15 mL Oral Q12H  . insulin aspart  0-6 Units Subcutaneous TID WC  . mouth rinse  15 mL Mouth Rinse QID  . megestrol  200 mg Oral QID  . metoprolol tartrate  37.5 mg Oral BID  . midodrine  40 mg Oral 3 times per day on Tue Thu Sat  . multivitamin  1 tablet Oral QHS  . nutrition supplement (JUVEN)  1 packet Per Tube BID BM  . nystatin   Topical BID  . pantoprazole  40 mg Oral Daily  . sodium chloride flush  10-40 mL Intracatheter Q12H    have reviewed scheduled and prn medications.  Physical Exam: General:NAD, comfortable, tracheostomy Heart:RRR, s1s2 nl Lungs:clear b/l, no crackle Abdomen:soft, Non-tender, non-distended Extremities: Trace dependent edema. Dialysis Access: Right IJ TDC in place. Neurology: Alert awake and following commands.  Candace [name] Candace [name] 12/10/2020,8:49 AM  LOS: 100 days

## 2020-12-10 NOTE — Progress Notes (Signed)
@  2332 Making rounds on pts. Pt requested more ice chips . Tolerated fairly well. Requested her glasses. She appears comfortable watching television. Call light left at pts reach. All needs met.

## 2020-12-10 NOTE — Progress Notes (Signed)
Physical Therapy Treatment Patient Details Name: Candace [name] MRN: [id number] DOB: 1951-04-09 Today's Date: 12/10/2020    History of Present Illness Pt 70 y.o. female with medical history significant for recent covid pna, obesity, colon cancer s/p colon resection with colostomy bag, HLD, NIDDM2, CKD3 presented to ED after her follow-up nurse advised her to present to ED for elevated HR. +afib, elevated troponins with demand ischemia,  CT scan is positive for bowel perforation with pneumomediastinum 11/03 exp lap with repair of perforated ulcer. 11/10 t/f to ICU- intubated/sedated/proned. Trach placed 11/24. CRRT off 12/8, restarted 12/10-1/5. 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    PT Comments    Despite pain of PM wound vac change pt eager to get up OOB and try her new power WC.  Pt was able with mostly supervision (at times min assist around tighter obstacles) to drive WC >370' down the hallway and back.  Total lift used to get pt to chair, but once there she could use joystick to drive.  Significant amount of education provided by Candace [name], [facility] fitter from [employer] and PT on use of WC, controls, and cushion.  Pt excited for her new found mobility.  PT will continue to follow acutely for safe mobility progression.  Goals due next session.   Follow Up Recommendations  SNF     Equipment Recommendations  Hospital bed;Other (comment) (hoyer lift, air mattress, custom power WC)    Recommendations for Other Services OT consult     Precautions / Restrictions Precautions Precautions: Fall Precaution Comments: baseline R colostomy; sacral wound with wound vac, trach capped    Mobility  Bed Mobility Overal bed mobility: Needs Assistance Bed Mobility: Rolling Rolling: Max assist;+2 for physical assistance         General bed mobility comments: Two person max assist to roll bil for lift pad placement and  removal/repositioning.    Transfers                 General transfer comment: maxi move lift used to get pt OOB to new loaner power WC and back to bed at end of session  Ambulation/Gait                 Stairs             Wheelchair Mobility    Modified Rankin (Stroke Patients Only)       Balance                                            Cognition Arousal/Alertness: Awake/alert Behavior During Therapy: WFL for tasks assessed/performed Overall Cognitive Status: Impaired/Different from baseline Area of Impairment: Following commands;Problem solving                       Following Commands: Follows one step commands with increased time     Problem Solving: Slow processing General Comments: Pt remains a bit slow to process, but generally improving.  Bright and awake during today's session, excited to get up to her new loaner power chair, did well following instructions on how to use the joystick to drive.      Exercises      General Comments General comments (skin integrity, edema, etc.): Candace [name] from [facility] vendor) present to  deliver, educate and instruct pt with PT on use of WC and functionality of WC, roho cushion, joysticks.  Two of Candace [name]'s daughters were present for education.      Pertinent Vitals/Pain Pain Assessment: Faces Faces Pain Scale: Hurts whole lot Pain Location: buttock with repositioning Pain Descriptors / Indicators: Grimacing Pain Intervention(s): Limited activity within patient's tolerance;Monitored during session;Repositioned    Home Living                      Prior Function            PT Goals (current goals can now be found in the care plan section) Acute Rehab PT Goals Patient Stated Goal: to drive her WC Progress towards PT goals: Progressing toward goals    Frequency    Min 2X/week      PT Plan Current plan remains appropriate    Co-evaluation               AM-PAC PT "6 Clicks" Mobility   Outcome Measure  Help needed turning from your back to your side while in a flat bed without using bedrails?: A Lot Help needed moving from lying on your back to sitting on the side of a flat bed without using bedrails?: Total Help needed moving to and from a bed to a chair (including a wheelchair)?: Total Help needed standing up from a chair using your arms (e.g., wheelchair or bedside chair)?: Total Help needed to walk in hospital room?: Total Help needed climbing 3-5 steps with a railing? : Total 6 Click Score: 7    End of Session   Activity Tolerance: Patient limited by fatigue;Patient limited by pain Patient left: in bed;with call bell/phone within reach;with family/visitor present   PT Visit Diagnosis: Muscle weakness (generalized) (M62.81);Difficulty in walking, not elsewhere classified (R26.2);Pain;Adult, failure to thrive (R62.7);Unsteadiness on feet (R26.81) Pain - Right/Left: Right Pain - part of body: Knee     Time: 1615-1730 PT Time Calculation (min) (ACUTE ONLY): 75 min  Charges:  $Therapeutic Activity: 23-37 mins $Self Care/Home Management: 8-22 $Wheel Chair Management: 23-37 mins                     Candace [name], PT, DPT  Acute Rehabilitation [phone] pager [phone]) [phone] office

## 2020-12-10 NOTE — Progress Notes (Signed)
Physical Therapy Wound Treatment Patient Details  Name: Candace [name] MRN: [id number] Date of Birth: 1951/10/07  Today's Date: 12/10/2020 Time: 0355-9741 Time Calculation (min): 62 min  Subjective  Subjective: Pt and daughter overall pleasant and agreeable to hydrotherapy. Patient and Family Stated Goals: heal wound Date of Onset:  (unknown) Prior Treatments: prior hydro and Dakins  Pain Score:  Premedicated and tolerated well.   Wound Assessment  Pressure Injury 09/25/20 Sacrum Bilateral;Medial Deep Tissue Pressure Injury - Purple or maroon localized area of discolored intact skin or blood-filled blister due to damage of underlying soft tissue from pressure and/or shear. Purple, non-blanchable, b (Active)  Dressing Type Barrier Film (skin prep);Negative pressure wound therapy 12/10/20 1454  Dressing Changed 12/10/20 1454  Dressing Change Frequency Monday, Wednesday, Friday 12/10/20 1454  State of Healing Early/partial granulation 12/10/20 1454  Site / Wound Assessment Pink;Yellow;Black 12/10/20 1454  % Wound base Red or Granulating 40% 12/10/20 1454  % Wound base Yellow/Fibrinous Exudate 50% 12/10/20 1454  % Wound base Black/Eschar 10% 12/10/20 1454  % Wound base Other/Granulation Tissue (Comment)    Peri-wound Assessment Intact 12/10/20 1454  Wound Length (cm) 14 cm 12/01/20 1722  Wound Width (cm) 12 cm 12/01/20 1722  Wound Depth (cm) 6 cm 12/01/20 1722  Wound Surface Area (cm^2) 168 cm^2 12/01/20 1722  Wound Volume (cm^3) 1008 cm^3 12/01/20 1722  Tunneling (cm) 0 12/01/20 1722  Undermining (cm) 0 12/01/20 1722  Margins Unattached edges (unapproximated) 12/10/20 1454  Drainage Amount Minimal 12/10/20 1454  Drainage Description Sanguineous 12/10/20 1454  Treatment Debridement (Selective);Negative pressure wound therapy 12/10/20 1454     Negative Pressure Wound Therapy Sacrum (Active)  Last dressing change 12/10/20 12/10/20 1454  Site / Wound Assessment Pink;Yellow;Black  12/10/20 1454  Peri-wound Assessment Intact 12/10/20 1454  Size see above 12/10/20 1454  Wound filler - Black foam 2 12/10/20 1454  Wound filler - White foam 0 12/10/20 1454  Wound filler - Nonadherent 0 12/10/20 1454  Wound filler - Gauze 0 12/10/20 1454  Cycle Continuous 12/10/20 1454  Target Pressure (mmHg) 125 12/10/20 1454  Instillation Volume 26 mL 12/10/20 1454  Instillation Solution Normal Saline 12/10/20 1454  Instillation Soak Time 10 minutes 12/10/20 1454  Instillation Therapy Time 3.5 hours 12/10/20 1454  Canister Changed Yes 12/10/20 1454  Machine plugged into wall outlet (NOT bed outlet) Yes 12/10/20 1454  Dressing Status Intact 12/10/20 1454  Drainage Amount Minimal 12/10/20 1454  Drainage Description Sanguineous 12/10/20 1454  Output (mL) 100 mL 12/08/20 1245      Selective Debridement Selective Debridement - Location: sacrum Selective Debridement - Tools Used: Forceps;Scissors Selective Debridement - Tissue Removed: unviable tissue   Wound Assessment and Plan  Wound Therapy - Assess/Plan/Recommendations Wound Therapy - Clinical Statement: Wound bed continues to improve with necrotic tissue softening with easier debridement. No active bleeding noted this session. Will continue to follow. Wound Therapy - Functional Problem List: Global weakness and immobility Factors Delaying/Impairing Wound Healing: Diabetes Mellitus;Immobility;Multiple medical problems;Other (comment) (poor nutrition) Hydrotherapy Plan: Debridement;Pulsatile lavage with suction;Patient/family education;Dressing change Wound Therapy - Frequency: 6X / week Wound Therapy - Follow Up Recommendations: Skilled nursing facility Wound Plan: see above  Wound Therapy Goals- Improve the function of patient's integumentary system by progressing the wound(s) through the phases of wound healing (inflammation - proliferation - remodeling) by: Decrease Necrotic Tissue to: 40 Decrease Necrotic Tissue -  Progress: Progressing toward goal Increase Granulation Tissue to: 60 Increase Granulation Tissue - Progress: Progressing toward goal Goals/treatment plan/discharge plan were  made with and agreed upon by patient/family: Yes Time For Goal Achievement: 7 days Wound Therapy - Potential for Goals: Fair  Goals will be updated until maximal potential achieved or discharge criteria met.  Discharge criteria: when goals achieved, discharge from hospital, MD decision/surgical intervention, no progress towards goals, refusal/missing three consecutive treatments without notification or medical reason.  GP     Thelma Comp 12/10/2020, 3:10 PM   Rolinda Roan, PT, DPT Acute Rehabilitation Services Pager: [phone] Office: [phone]

## 2020-12-10 NOTE — Progress Notes (Signed)
Pt rounds being done. Pt is asleep and appears comfortable. Call light in reach

## 2020-12-10 NOTE — Progress Notes (Signed)
At a little over 2100, I was assisted by Armstead Peaks and Francee Nodal RN. Pt was repositioned to what she felt was comfortable for her. Pt was once again told 1 by 1 what medication were being given. She requested Oxycodone for pain and refused Voltaren. Pt also requested ice chips which she tolerated fairly well.  All needs were met (MD ordered and pt needs requested) Call light, remote and telephone were left at pts reach. Pt stated she was comfortable and did not need anything at the given moment.

## 2020-12-10 NOTE — Progress Notes (Addendum)
Went by to see patient.  Awake and interactive.  Leg discomfort much better with Voltaren gel.  Discussed with daughter LTAC and daughter was open to explore the idea but apparently patient would need to sit in chair for HD.  When patient able to do this, plan is to go home with continued excellent care from family.  Will work with patient and daughter to form daily goals that lead to patient being able to go home with outpatient HD -discussed with renal navigator:  4 hours for HD (and it can be in her room--simulating time for HD), but also 1-2 hours for transport back and forth. And that should really be in a wheelchair more than a recliner. Some patients can't tolerate being upright in a wheelchair for transport  [place]

## 2020-12-10 NOTE — Progress Notes (Signed)
  Speech Language Pathology Treatment: Dysphagia  Patient Details Name: Candace [name] MRN: [id number] DOB: 07-Nov-1950 Today's Date: 12/10/2020 Time: 7035-0093 SLP Time Calculation (min) (ACUTE ONLY): 16 min  Assessment / Plan / Recommendation Clinical Impression  Pt was seen for dysphagia treatment with her daughter present for the latter portion of the session. She was alert and cooperative throughout the session. Pt's trach is now capped and she reported that she believes her voice has improved further. Pt expressed some pain and requested to be repositioned. Repositioning was provided by this SLP and OT and pt reported adequate comfort following repositioning. Pt's overall swallow function appears improved. She tolerated 80% of thin liquids boluses via straw without overt s/sx of aspiration when her bed was at 25 degrees. Throat clearing and delayed coughing was noted once. She tolerated 100% of boluses of thin liquids via straw without overt s/sx of aspiration when her bed was repositioned to 40-43 degrees. Overall, signs of aspiration were noted once with consumption of 8oz of thin liquids via straw. Mastication was prolonged, but functional for regular texture solids. Her diet will be advanced to regular texture solids and thin liquids. SLP will see pt once more to ensure tolerance of the upgraded diet, but it is anticipated that further SLP services will not be clinically indicated beyond that point.    HPI HPI: 70 year old female who was previously diagnosed with Covid 08/23/2020.  Admitted 11/2 and intubated with AF-RVR, found to have a perforated duodenal underwent exploratory laparotomy with Phillip Heal patch placement 11/3, Intubated for 20 days until 11/23 then trach placed. Prolonged ventilation, until 1/17. Profoundly deconditioned. Trach tube downsized to #4 cuffless Shiley Flex on 2/4.      SLP Plan  Continue with current plan of care       Recommendations  Diet recommendations:  Regular;Thin liquid Liquids provided via: Cup;Straw Medication Administration: Crushed with puree (pills may be taken whole with puree if pt prefers) Supervision: Staff to assist with self feeding;Full supervision/cueing for compensatory strategies Compensations: Slow rate;Small sips/bites Postural Changes and/or Swallow Maneuvers: Seated upright 90 degrees      Patient may use Passy-Muir Speech Valve: During all waking hours (remove during sleep);During PO intake/meals PMSV Supervision: Full         Oral Care Recommendations: Oral care BID Follow up Recommendations: 24 hour supervision/assistance;Home health SLP SLP Visit Diagnosis: Dysphagia, unspecified (R13.10);Aphonia (R49.1) Plan: Continue with current plan of care       Mahli Glahn I. Hardin Negus, [place], [place] Office number [phone] Pager [place] 12/10/2020, 9:14 AM

## 2020-12-10 NOTE — Progress Notes (Addendum)
[facility] PROGRESS NOTE  Candace [name] QMV:[id number] DOB: 11-26-1950 DOA: 08/31/2020 PCP: Algis Greenhouse, MD            Status: Remains inpatient appropriate because:Ongoing diagnostic testing needed not appropriate for outpatient work up, Unsafe d/c plan, IV treatments appropriate due to intensity of illness or inability to take PO and Inpatient level of care appropriate due to severity of illness   Dispo: The patient is from: Home              Anticipated d/c is to: Home with home health               Anticipated d/c date is: > 3 days              Patient currently is not medically stable to d/c.  Barriers to discharge: Continues to require HD- unable to sit in chair/get in [facility] for OP HD-also requiring Cortrak for nocturnal feeds; still has trach with copiuous secretions at times  Considerations for home discharge: 1. Patient will need to continue wound VAC and current wound VAC is apparently not available in the home setting 2. It is likely she will need a specialty bed.  We are currently transitioning back to the [facility] rotational bed which will allow for better in bed mobility including the ability to "stand" with PT in the bed without actually getting patient out of bed 3. Likely would need to be decannulated to minimize other care issues after discharge 4. Would need to be able to either mobilize for outpatient hemodialysis or be a candidate for home dialysis 5. Would not have aggressive PT OT available in the home setting that she can achieve during hospitalization 6. Patient will have family members that are CNAs managing her after discharge but this would be 24/7 requirement and this could be an overwhelming issue once patient is actually home  Code Status: Full Family Communication: 2/11 daughter [name] at bedside DVT prophylaxis: Eliquis Vaccination status: Has not been vaccinated but did have severe COVID infection initially diagnosed on 08/23/2020.  Patient will be  eligible for COVID-vaccine after 11/23/2020   Foley catheter: No, purewick female urinary collection device   HPI: 70 year old female patient with prior history of diabetes, stage III chronic kidney disease, atrial fibrillation, hypertension, dyslipidemia and colon cancer that is post colectomy and colostomy.  Initially diagnosed with COVID on 08/23/20 she was admitted to the hospital due to dehydration, acute kidney injury and hypoxemic respiratory failure.  She was discharged home on 2 to 3 L of oxygen after being treated with IV steroids and remdesivir.  During that time although she had elevated inflammatory markers she had no embolic or thrombotic disease.  She had been on prophylactic dose heparin during the hospitalization and was placed on Eliquis for 2 weeks for after discharge  Patient returned to the ER on 08/31/2020 for complaints of not feeling well.  She was found to be in atrial fibrillation with RVR, hemorrhagic shock and work-up revealed a perforated duodenal ulcer.  She underwent exploratory laparotomy on 11/3.  She has had an extensive ICU stay with significant events as below.   11/2 Admitted with rapid Afib 11/3 OR with findings of perforated duodenal ulcer 11/10 progressive hemorrhagic shock, intubated, transfused, pressors, proned; started on CRRT in PM 11/16 Extubated. Re-intubated overnight due to respiratory distress and hypoxia with decreased mentation 11/18 Bronch, cultures sent 11/19 Hgb down getting blood 11/24 Spiked fever, resume empirical antimicrobial therapy 11/26 Hemorrhagic shock, hgb  5.6, increased pressors,  11/30 Per palliative "[name] understanding that patient is unfortunately very fragiledespite ongoing intensive medical treatment and full mechanical support. [name] that the familywantsto continue with all current interventions despite potential outcomes". 12/08 CRRT discontinued due to clotting 12/09 Family requested transfer to  tertiary care [facility] denied transfer  12/10 CRRT restarted. Episodes of tachycardia, tachypnea that seem to improve with pain management 12/11 Back in shock. Pressor requirements up. CXR worse. ABX resumed 12/12 Still requiring inc pressors. Had hypoglycemic event. Basal insulin dosing adjusted 12/13 Pressor requirements better. Now hyperglycemic. Re-adjusted Glycemic control  12/19 Afebrile . Remains on dilaudid and heparin gtt, dilaudid gtt increased overnight for concern of pain / ongoing tachycardia, no other events . NE and precedex off 12/17. Ongoing CRRT- even UF, dosing lokelmia this morning 12/20 On CRRT. Renal plans for HD cath and moving to HD. Getting wound care 12/24 - renal stopping CRRT today and plans HD 10/24/20 . 40% fio2 on vent via Trach. TAchypenic and tachycardic. Afebrile but wbc up to 27.6K. On TF. On heparin gtt 12/25 - Back on CRRT. On vent via trach at 40% fio2, On scheduled dilaudid as add on to oxy. Per family request 12/24 - they felt scheduled oxy was not adequate and patient was showing signs of opioid withdrawal.  Patient also had worsening SIRS/sepsis syndrome. Had fever, rising wbc, worsening RR 40-60 and HR 140s sinus->started On abx yesterday. Fever some better today. WBC plateau at 28,.5K. On new levophed ->signifanct improvement in HR 77 and RR t0 20. On heparin gtt. On precedex gtt. On levophed gtt 67mg wthi midodrine. On TF 12/30 Remains on CRRT with intermittent pressor requirements. Ethics consult placed 12/29. Ethics rec time trial of CRRT 12/31 failed SBT with RR 40s. Several conversations between care tam and family, who are upset RE plan of care 1/1 back on pressors  1/4: On pressors, keeping even on CVVHD, HGB drop to 6.9, transfused 1 unit. Improving mental status 1/10 remains on low dose levophed 232m 1/11 off levo, attempting HD with UF for first time. Now tolerating intermittent HD. 1/19 patient transferred to progressive bed with  tracheostomy on 35% FiO2.  has NG tube feeding.  No PEG tube.  Received dialysis on 1/18. 1/24 significant improvement and persistent tachycardia after introduction of twice daily beta-blocker.  Heart rates have improved from the 130s to the 98-104 range 1/24 core track clogged and therefore has been removed by nutrition team.  Calorie count in progress and we are weaning any sedating medications which could be contributing to patient's inability to eat. 1/27 continue w/ cuff #6 trach   Subjective: Patient in process of having linen changed.  After she had settled down had a "cheerleading session" with the patient regarding how well she is doing given valid clinical expectations that she should not have survived her critical illness that included Covid pneumonia and bleeding duodenal ulcer.  Incorporated patient's faith to emphasize that God has a bigger plan for her and that He is not done with her yet.  She smiled and stated yes.  We continue to discuss how God will continue to her healing.  We are hopeful that it is in his plan to allow full recovery of her kidneys but if not he will provide a way.  Daughter also at bedside and involved in this same conversation.  Multiple questions answered.  Updated daughter on need to decrease beta-blocker dose to see if this will improve patient's dialysis blood pressure readings  to allow for pain medications during dialysis treatments in the future.  Patient reports right lower extremity calf pain improved with administration of Voltaren gel.   Objective: Vitals:   12/10/20 0406 12/10/20 0729  BP:  116/66  Pulse: (!) 109   Resp: 20 (!) 22  Temp: 97.9 F (36.6 C) 99.3 F (37.4 C)  SpO2: 100% 100%    Intake/Output Summary (Last 24 hours) at 12/10/2020 0815 Last data filed at 12/10/2020 0406 Gross per 24 hour  Intake --  Output 1100 ml  Net -1100 ml   Filed Weights    Exam: Constitutional: Awake alert, smiling Respiratory: Anterior lung sounds  clear and no increased work of breathing.  FiO2 21%. #4 cuffed trach with PMV and any secretions.  It appears she was suctioned at least once overnight of a small amount of tan thick secretions. Cardiovascular: Sinus tachycardia with her responses for the most part at less than 120 bpm.  No peripheral edema.  Normal heart sounds Abdomen: On regular diet with nectar thick liquids- LBM 2/2-core track .  Right colostomy unremarkable in appearance. Skin:  Massive sacral decubitus ulcer-wound VAC -due VAC change today Neurologic: CN 2-12 grossly intact. Sensation intact, DTR normal. Strength 1/5 x all 4 extremities.  Psychiatric: Awake, flat affect, oriented x3   Assessment/Plan: Acute problems: PAF maintaining sinus rhythm w/persistent tachycardia -Given suboptimal blood pressure readings during dialysis have opted to decrease metoprolol to 37.5 mg BID and monitor for breakthrough worsening of tachycardia -Echocardiogram this admission with an EF 50 to 55% with mild diastolic dysfunction parameters and normal RV systolic function -Continue Eliquis-benefits coordinator has determined that Eliquis is covered at [facility] [place] with a co-pay of $47  Acute respiratory failure secondary to COVID-pneumonia/tracheostomy/acute MSSA tracheobronchitis -Patient stable on low-flow oxygen FiO2 21% -PCCM/trach team following-change to #4 trach on 2/4 -Repeat respiratory culture on 1/24 with abundant staph with sensitivities pending -Completed IV Ancef for MSSA tracheobronchitis -Continue  suctioning and chest PT-Vibra vest -Continue PMV training  -Chest x-ray from 2/9 stated stable bibasilar atelectasis or infiltrates  Acute kidney injury secondary to COVID-related sepsis with shock superimposed on stage III chronic kidney disease -Continue intermittent hemodialysis as directed by the renal team -Avoid nephrotoxic medications -Continue Aranesp on dialysis days -Patient is voiding; daily total between 206  100 cc per 24 hours -BP improved therefore will change midodrine to use on dialysis days only 3 times daily  Lower extremity muscle pain -Continue Crea topically-attending MD added topical NSAIDs on 2/10 with improvement in pain.  Patient clarifies this is not a cramping intermittent pain but a constant pain. -Continue PROM and AROM -Family to bring in tennis shoes to wear in an alternating schedule to help improve foot flexion noting this improves her leg pain  Anxiety -Vistaril prn added by attending on 2/10  Dysphagia/moderate to severe protein calorie malnutrition Nutrition Status: Nutrition Problem: Increased nutrient needs Etiology: wound healing Signs/Symptoms: estimated needs Interventions: Refer to RD note for recommendations  Estimated body mass index is 31.04 kg/m as calculated from the following:   Height as of this encounter: 5' 8"  (1.727 m).   Weight as of this encounter: 92.6 kg.  -2/3: Continue regular diet with nectar thick liquids -2/7 at recommendation of nutrition team resumed nocturnal tube feedings -Continue Megace and Nepro shakes orally -2/9 follow-up SLP evaluation pending  Acute hemorrhagic shock secondary to perforated duodenal ulcer/anemia of critical illness (initial reason for admission) -Stable postoperatively -Hemoglobin remains stable greater than 8.0  Diabetes mellitus 2 -Patient has been transition to regular diet along with tube feedings and we have noted significant increase in CBG readings -Continue very sensitive SSI with meals -HgbA1c 08/24/2020 and was 7.0 -Prior to admission patient was on metformin XR-unable to use at this juncture due to severity of kidney disease -we are hopeful for recovery of renal function  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 -SNF recommended but family wants to take patient home -At this juncture patient unable to sit up in bed independently much less stand pivot and rotate to  get into wheelchair and would be significant fall risk and care risk at home -In addition patient would need to be able to get in wheelchair to transport to dialysis if dialysis were to be continued in the outpatient setting -family refuses LTAC or SNF rehab due to pt prior request to NEVER be placed in any type of facility -Continue high-dose midodrine 3 times daily on dialysis days only -Persistent tachycardia secondary to severe deconditioning has improved with the introduction of beta-blockers -Continue PROM every 4 hours-patient encouraged to perform independent AROM as well -2/3 order placed for repositioning every 2 hours -2/2: Successful out of bed to chair with initial short-term plan for 45 minutes to 1 hour today -2/11 continue therabands to siderails for UE resistance training -FAMILY THAT WILL PROVIDE CARE FOR PT WILL NEED TO ASSIST WITH CARE OF THIS PT AT [place]  Stage IV sacral decubitus -Not present on admission -Wound care nurse following and utilizing Santyl and hydrotherapy and now has wound VAC in place -Surgical team consulted.  Current recommendation is against pursuing general anesthesia to undergo extensive surgical procedure-hopefully can be reevaluated in the future if wound does not heal adequately.  As of 1/28 they have nothing further acutely to add but will be following peripherally. [facility] rotational bed  -Continue low-dose oxycodone for pain patient complaining of inadequate pain control at sacral decubitus.  -Order placed for pain assessment every 2 hours with request to document each assessment in the progress notes  -  11/24/2020 after [facility]      11/22/2020                       11/29/2020     12/06/20   Incision (Closed) 09/01/20 Abdomen (Active)  Date First Assessed/Time First Assessed: 09/01/20 1737   Location: Abdomen    Assessments 09/01/2020  6:25 PM 12/07/2020  8:13 AM  Dressing Type Gauze (Comment) Moist to dry  Dressing --  Dry;Clean;Intact  Dressing Change Frequency -- Daily  Site / Wound Assessment Dressing in place / Unable to assess --  Drainage Amount None --     No Linked orders to display     Pressure Injury 09/17/20 Ear Left;Anterior;Posterior Stage 2 -  Partial thickness loss of dermis presenting as a shallow open injury with a red, pink wound bed without slough. (Active)  Date First Assessed/Time First Assessed: 09/17/20 0800   Location: Ear  Location Orientation: Left;Anterior;Posterior  Staging: Stage 2 -  Partial thickness loss of dermis presenting as a shallow open injury with a red, pink wound bed without slough. ...    Assessments 09/17/2020  8:00 AM 12/05/2020  8:00 PM  Dressing Type Foam - Lift dressing to assess site every shift None  Dressing Clean;Dry;Intact --  Dressing Change Frequency Every 3 days --  Site / Wound Assessment Dry;Pink --  Peri-wound Assessment Intact --  Wound Length (cm) 2  cm --  Wound Width (cm) 1 cm --  Wound Depth (cm) 0.25 cm --  Wound Surface Area (cm^2) 2 cm^2 --  Wound Volume (cm^3) 0.5 cm^3 --  Margins Unattached edges (unapproximated) --  Drainage Amount Scant --  Drainage Description Serosanguineous --  Treatment Cleansed --     No Linked orders to display     Pressure Injury 09/25/20 Sacrum Bilateral;Medial Deep Tissue Pressure Injury - Purple or maroon localized area of discolored intact skin or blood-filled blister due to damage of underlying soft tissue from pressure and/or shear. Purple, non-blanchable, b (Active)  Date First Assessed/Time First Assessed: 09/25/20 2000   Location: Sacrum  Location Orientation: Bilateral;Medial  Staging: Deep Tissue Pressure Injury - Purple or maroon localized area of discolored intact skin or blood-filled blister due to damage o...    Assessments 09/25/2020  5:00 PM 12/08/2020 12:45 PM  Dressing Type Foam - Lift dressing to assess site every shift Barrier Film (skin prep);Negative pressure wound therapy  Dressing  Changed;Clean;Dry;Intact Changed  Dressing Change Frequency -- Monday, Wednesday, Friday  State of Healing -- Early/partial granulation  Site / Wound Assessment -- Black;Yellow;Pink;Red;Granulation tissue  % Wound base Red or Granulating -- 40%  % Wound base Yellow/Fibrinous Exudate -- 50%  % Wound base Black/Eschar -- 10%  Peri-wound Assessment -- Intact  Margins -- Unattached edges (unapproximated)  Drainage Amount -- Minimal  Drainage Description -- Sanguineous  Treatment -- Debridement (Selective);Negative pressure wound therapy     No Linked orders to display     Negative Pressure Wound Therapy Sacrum (Active)  Placement Date/Time: 11/22/20 1500   Wound Type: (c) Other (Comment)  Location: Sacrum    Assessments 11/22/2020  4:59 PM 12/08/2020 12:45 PM  Last dressing change 11/22/20 12/08/20  Site / Wound Assessment Granulation tissue;Pale;Yellow Pink;Red;Yellow;Black;Granulation tissue  Peri-wound Assessment Intact Intact  Size see above see above  Wound filler - Black foam 2 2  Wound filler - White foam 0 0  Wound filler - Nonadherent 0 0  Wound filler - Gauze 0 0  Cycle Continuous Continuous  Target Pressure (mmHg) 125 125  Instillation Volume 26 mL 26 mL  Instillation Solution Normal Saline Normal Saline  Instillation Soak Time 10 minutes 10 minutes  Instillation Therapy Time 3.5 hours 3.5 hours  Canister Changed No No  Dressing Status Intact Intact  Drainage Amount None Minimal  Output (mL) -- 100 mL     No Linked orders to display     Other problems: Coag neg staph-1/2 blood cultures positive -dw ID pharmacist- cultures c/w contamination Vancomycin ordered overnight stopped on 2/8  Acute encephalopathy secondary to prolonged hospital stay -Resolved  History of stage IV colon cancer -Old colostomy  Acute encephalopathy with ICU delirium -Delirium has resolved and supportive meds of Klonopin and Seroquel have been weaned and  discontinued  Hypomagnesemia -Continue oral magnesium -1/21 give 4 g magnesium IV x1 -Follow labs  DVT bilateral posterior tibial veins -Was not present during initial COVID admission -Continue Eliquis for now given severe debility and increased risk for developing recurrent DVT  Hypertension -Having issues with orthostasis requiring midodrine Prior to admission patient was on Norvasc  Dyslipidemia -Prior to admission patient was on Crestor-consider resuming soon once patient can swallow pills whole  Abnormal TSH -Nov 2021 TSH was 0.015-TSH and free T4, T3 normal this admission  Data Reviewed: Basic Metabolic Panel: Recent Labs  Lab 12/05/20 0610 12/06/20 0117 12/07/20 0550 12/08/20 0303 12/09/20 0903  NA 138 136 136 136  137  K 3.2* 3.4* 3.4* 3.3* 3.1*  CL 105 107 106 101 102  CO2 22 19* 19* 23 23  GLUCOSE 131* 181* 147* 203* 163*  BUN 35* 35* 46* 23 63*  CREATININE 0.95 1.03* 0.90 0.95 1.00  CALCIUM 9.1 9.5 10.0 8.8* 10.7*  PHOS 3.7 3.7 3.5 2.8 3.2   Liver Function Tests: Recent Labs  Lab 12/05/20 0610 12/06/20 0117 12/07/20 0550 12/08/20 0303 12/09/20 0903  ALBUMIN 2.1* 2.1* 2.0* 2.2* 2.2*   No results for input(s): LIPASE, AMYLASE in the last 168 hours. No results for input(s): AMMONIA in the last 168 hours. CBC: Recent Labs  Lab 12/04/20 0819 12/06/20 0117 12/07/20 0550 12/08/20 0303 12/09/20 0903  WBC 16.6* 13.7* 17.2* 17.5* 16.5*  NEUTROABS  --  9.9* 12.1* 12.9*  --   HGB 7.5* 7.7* 7.4* 7.8* 8.1*  HCT 24.2* 26.2* 25.2* 24.8* 26.8*  MCV 84.9 87.6 86.9 86.1 87.9  PLT 417* 355 395 393 443*   Cardiac Enzymes: No results for input(s): CKTOTAL, CKMB, CKMBINDEX, TROPONINI in the last 168 hours. BNP (last 3 results) Recent Labs    09/07/20 0118 09/08/20 0446 09/09/20 0428  BNP 216.8* 432.5* 609.7*    ProBNP (last 3 results) No results for input(s): PROBNP in the last 8760 hours.  CBG: Recent Labs  Lab 12/09/20 1546 12/09/20 2041  12/10/20 0015 12/10/20 0405 12/10/20 0735  GLUCAP 170* 203* 172* 231* 248*    Recent Results (from the past 240 hour(s))  Culture, blood (routine x 2)     Status: Abnormal   Collection Time: 12/05/20 11:07 AM   Specimen: BLOOD RIGHT ARM  Result Value Ref Range Status   Specimen Description BLOOD RIGHT ARM  Final   Special Requests   Final    BOTTLES DRAWN AEROBIC ONLY Blood Culture adequate volume   Culture  Setup Time   Final    GRAM POSITIVE COCCI IN CLUSTERS AEROBIC BOTTLE ONLY CRITICAL RESULT CALLED TO, READ BACK BY AND VERIFIED WITH: Tillman Sers PHARMD 2000 12/06/20 A BROWNING    Culture (A)  Final    STAPHYLOCOCCUS EPIDERMIDIS THE SIGNIFICANCE OF ISOLATING THIS ORGANISM FROM A SINGLE SET OF BLOOD CULTURES WHEN MULTIPLE SETS ARE DRAWN IS UNCERTAIN. PLEASE NOTIFY THE MICROBIOLOGY DEPARTMENT WITHIN ONE WEEK IF SPECIATION AND SENSITIVITIES ARE REQUIRED. Performed at [facility], [place] [address], [place], [place] [address]    Report Status 12/08/2020 FINAL  Final  Culture, blood (routine x 2)     Status: None   Collection Time: 12/05/20 11:07 AM   Specimen: BLOOD LEFT HAND  Result Value Ref Range Status   Specimen Description BLOOD LEFT HAND  Final   Special Requests   Final    BOTTLES DRAWN AEROBIC ONLY Blood Culture adequate volume   Culture   Final    NO GROWTH 5 DAYS Performed at [facility], [place] [address], [place], [place] [address]    Report Status 12/10/2020 FINAL  Final  Blood Culture ID Panel (Reflexed)     Status: Abnormal   Collection Time: 12/05/20 11:07 AM  Result Value Ref Range Status   Enterococcus faecalis NOT DETECTED NOT DETECTED Final   Enterococcus Faecium NOT DETECTED NOT DETECTED Final   Listeria monocytogenes NOT DETECTED NOT DETECTED Final   Staphylococcus species DETECTED (A) NOT DETECTED Final    Comment: CRITICAL RESULT CALLED TO, READ BACK BY AND VERIFIED WITH: Tillman Sers PHARMD 2000 12/06/20 A BROWNING    Staphylococcus aureus (BCID)  NOT DETECTED NOT DETECTED Final   Staphylococcus epidermidis DETECTED (A) NOT DETECTED Final    Comment: Methicillin (oxacillin) resistant coagulase negative staphylococcus. Possible blood culture contaminant (unless isolated from more than one blood culture draw or clinical case suggests pathogenicity). No antibiotic treatment is indicated for blood  culture contaminants. CRITICAL RESULT CALLED TO, READ BACK BY AND VERIFIED WITH: Tillman Sers PHARMD 2000 12/06/20 A BROWNING    Staphylococcus lugdunensis NOT DETECTED NOT DETECTED Final   Streptococcus species NOT DETECTED NOT DETECTED Final   Streptococcus agalactiae NOT DETECTED NOT DETECTED Final   Streptococcus pneumoniae NOT DETECTED NOT DETECTED Final   Streptococcus pyogenes NOT DETECTED NOT DETECTED Final   A.calcoaceticus-baumannii NOT DETECTED NOT DETECTED Final   Bacteroides fragilis NOT DETECTED NOT DETECTED Final   Enterobacterales NOT DETECTED NOT DETECTED Final   Enterobacter cloacae complex NOT DETECTED NOT DETECTED Final   Escherichia coli NOT DETECTED NOT DETECTED Final   Klebsiella aerogenes NOT DETECTED NOT DETECTED Final   Klebsiella oxytoca NOT DETECTED NOT DETECTED Final   Klebsiella pneumoniae NOT DETECTED NOT DETECTED Final   Proteus species NOT DETECTED NOT DETECTED Final   Salmonella species NOT DETECTED NOT DETECTED Final   Serratia marcescens NOT DETECTED NOT DETECTED Final   Haemophilus influenzae NOT DETECTED NOT DETECTED Final   Neisseria meningitidis NOT DETECTED NOT DETECTED Final   Pseudomonas aeruginosa NOT DETECTED NOT DETECTED Final   Stenotrophomonas maltophilia NOT DETECTED NOT DETECTED Final   Candida albicans NOT DETECTED NOT DETECTED Final   Candida auris NOT DETECTED NOT DETECTED Final   Candida glabrata NOT DETECTED NOT DETECTED Final   Candida krusei NOT DETECTED NOT DETECTED Final   Candida parapsilosis NOT DETECTED NOT DETECTED Final   Candida tropicalis NOT DETECTED NOT DETECTED Final    Cryptococcus neoformans/gattii NOT DETECTED NOT DETECTED Final   Methicillin resistance mecA/C DETECTED (A) NOT DETECTED Final    Comment: CRITICAL RESULT CALLED TO, READ BACK BY AND VERIFIED WITH: Tillman Sers PHARMD 2000 12/06/20 A BROWNING Performed at [facility], [address], [place], [place] [address]      Studies: No results found.  Scheduled Meds: . acetaminophen (TYLENOL) oral liquid 160 mg/5 mL  650 mg Oral Q6H  . apixaban  2.5 mg Oral BID  . chlorhexidine gluconate (MEDLINE KIT)  15 mL Mouth Rinse BID  . Chlorhexidine Gluconate Cloth  6 each Topical Daily  . Chlorhexidine Gluconate Cloth  6 each Topical Q0600  . Chlorhexidine Gluconate Cloth  6 each Topical Q0600  . cholecalciferol  2,000 Units Oral Daily  . collagenase   Topical BID  . darbepoetin (ARANESP) injection - DIALYSIS  100 mcg Intravenous Q Sat-HD  . diclofenac Sodium  2 g Topical QID  . feeding supplement (NEPRO CARB STEADY)  237 mL Oral TID BM  . feeding supplement (NEPRO CARB STEADY)  780 mL Per Tube Q24H  . feeding supplement (PROSource TF)  45 mL Per Tube TID  . guaiFENesin  15 mL Oral Q12H  . insulin aspart  0-6 Units Subcutaneous TID WC  . mouth rinse  15 mL Mouth Rinse QID  . megestrol  200 mg Oral QID  . metoprolol tartrate  37.5 mg Oral BID  . midodrine  40 mg Oral 3 times per day on Tue Thu Sat  . multivitamin  1 tablet Oral QHS  . nutrition supplement (JUVEN)  1 packet Per Tube BID BM  . nystatin   Topical BID  . pantoprazole  40 mg  Oral Daily  . sodium chloride flush  10-40 mL Intracatheter Q12H   Continuous Infusions: . albumin human 25 g (11/16/20 1516)    Principal Problem:   Atrial fibrillation with RVR ([place]) Active Problems:   Acute respiratory failure due to COVID-19 [facility])   New onset a-fib (HCC)   Leukocytosis   Atrial fibrillation with rapid ventricular response (HCC)   Hypoxia   Pressure injury of skin   Acute hypoxemic respiratory failure (HCC)   ARDS (adult  respiratory distress syndrome) (HCC)   Perforated duodenal ulcer ([place])   On mechanically assisted ventilation ([place])   Palliative care by specialist   Goals of care, counseling/discussion   Shock ([place])   Acute and chronic respiratory failure (acute-on-chronic) ([place])   Status post tracheostomy ([place])   ESRD (end stage renal disease) ([place])   HAP (hospital-acquired pneumonia)   Consultants:  Cardiology  Surgery  Nephrology  Ethics  PCCM   Procedures: R PICC 11/5 >> A line 11/9 >> out ETT 11/9 > 11/16, 11/16 >> 09/21/2020, 09/21/2020 tracheostomy>> Lt [place] CVL 11/9 >> R IJ trialysis >> out HD catheter 12/1 >>12/20 12/21 - 14.5 Fr, 23 cm right IJ tunneled hemodialysis catheter placement. Removal of indwelling subclavian catheter.   Antibiotics: Anti-infectives (From admission, onward)   Start     Dose/Rate Route Frequency Ordered Stop   12/07/20 1200  vancomycin (VANCOCIN) IVPB 1000 mg/200 mL premix  Status:  Discontinued        1,000 mg 200 mL/hr over 60 Minutes Intravenous Every T-Th-Sa (Hemodialysis) 12/06/20 2156 12/07/20 1113   12/06/20 2245  vancomycin (VANCOREADY) IVPB 2000 mg/400 mL        2,000 mg 200 mL/hr over 120 Minutes Intravenous  Once 12/06/20 2156 12/07/20 0211   11/27/20 2000  ceFAZolin (ANCEF) IVPB 1 g/50 mL premix        1 g 100 mL/hr over 30 Minutes Intravenous Every 24 hours 11/26/20 1248 12/03/20 2136   11/26/20 1345  ceFAZolin (ANCEF) IVPB 1 g/50 mL premix        1 g 100 mL/hr over 30 Minutes Intravenous  Once 11/26/20 1248 11/26/20 1433   11/17/20 1415  fluconazole (DIFLUCAN) 40 MG/ML suspension 200 mg        200 mg Oral  Once 11/17/20 1315 11/17/20 1357   11/03/20 2200  meropenem (MERREM) 500 mg in sodium chloride 0.9 % 100 mL IVPB        500 mg 200 mL/hr over 30 Minutes Intravenous Every 24 hours 11/03/20 1500 11/06/20 2217   11/01/20 1000  anidulafungin (ERAXIS) 100 mg in sodium chloride 0.9 % 100 mL IVPB  Status:  Discontinued        "Followed by" Linked Group Details   100 mg 78 mL/hr over 100 Minutes Intravenous Every 24 hours 10/31/20 0916 11/01/20 1404   10/31/20 1015  meropenem (MERREM) 1 g in sodium chloride 0.9 % 100 mL IVPB  Status:  Discontinued        1 g 200 mL/hr over 30 Minutes Intravenous Every 8 hours 10/31/20 0916 11/03/20 1500   10/31/20 1015  linezolid (ZYVOX) IVPB 600 mg  Status:  Discontinued        600 mg 300 mL/hr over 60 Minutes Intravenous Every 12 hours 10/31/20 0916 11/02/20 0906   10/31/20 1015  anidulafungin (ERAXIS) 200 mg in sodium chloride 0.9 % 200 mL IVPB       "Followed by" Linked Group Details   200 mg 78 mL/hr over 200 Minutes  Intravenous  Once 10/31/20 0916 10/31/20 1630   10/25/20 1800  ceFAZolin (ANCEF) IVPB 2g/100 mL premix  Status:  Discontinued        2 g 200 mL/hr over 30 Minutes Intravenous Every 12 hours 10/25/20 1022 10/30/20 1103   10/23/20 2000  vancomycin (VANCOREADY) IVPB 750 mg/150 mL  Status:  Discontinued        750 mg 150 mL/hr over 60 Minutes Intravenous Every 24 hours 10/22/20 2036 10/25/20 1022   10/23/20 1800  piperacillin-tazobactam (ZOSYN) IVPB 3.375 g  Status:  Discontinued        3.375 g 100 mL/hr over 30 Minutes Intravenous Every 6 hours 10/23/20 1155 10/24/20 1422   10/23/20 0200  piperacillin-tazobactam (ZOSYN) IVPB 3.375 g  Status:  Discontinued        3.375 g 100 mL/hr over 30 Minutes Intravenous Every 8 hours 10/22/20 2036 10/23/20 1155   10/22/20 1630  vancomycin (VANCOREADY) IVPB 1500 mg/300 mL        1,500 mg 150 mL/hr over 120 Minutes Intravenous  Once 10/22/20 1537 10/22/20 2229   10/22/20 1630  piperacillin-tazobactam (ZOSYN) IVPB 2.25 g  Status:  Discontinued        2.25 g 100 mL/hr over 30 Minutes Intravenous Every 8 hours 10/22/20 1537 10/22/20 2036   10/22/20 1537  vancomycin variable dose per unstable renal function (pharmacist dosing)  Status:  Discontinued         Does not apply See admin instructions 10/22/20 1537 10/22/20 2036    10/19/20 0600  ceFAZolin (ANCEF) IVPB 2g/100 mL premix        2 g 200 mL/hr over 30 Minutes Intravenous To Radiology 10/18/20 1457 10/19/20 0938   10/10/20 0830  vancomycin (VANCOCIN) IVPB 1000 mg/200 mL premix       "Followed by" Linked Group Details   1,000 mg 200 mL/hr over 60 Minutes Intravenous Every 24 hours 10/09/20 0744 10/15/20 0902   10/09/20 0830  piperacillin-tazobactam (ZOSYN) IVPB 3.375 g        3.375 g 100 mL/hr over 30 Minutes Intravenous Every 6 hours 10/09/20 0744 10/16/20 0038   10/09/20 0830  vancomycin (VANCOREADY) IVPB 2000 mg/400 mL       "Followed by" Linked Group Details   2,000 mg 200 mL/hr over 120 Minutes Intravenous  Once 10/09/20 0744 10/09/20 1022   09/24/20 1000  ceFAZolin (ANCEF) IVPB 2g/100 mL premix  Status:  Discontinued        2 g 200 mL/hr over 30 Minutes Intravenous Every 12 hours 09/24/20 0801 09/24/20 1046   09/23/20 1400  vancomycin (VANCOCIN) IVPB 1000 mg/200 mL premix        1,000 mg 200 mL/hr over 60 Minutes Intravenous Every 24 hours 09/22/20 1436 09/28/20 1718   09/22/20 2200  ceFEPIme (MAXIPIME) 2 g in sodium chloride 0.9 % 100 mL IVPB  Status:  Discontinued        2 g 200 mL/hr over 30 Minutes Intravenous Every 12 hours 09/22/20 1436 09/24/20 0801   09/22/20 1030  ceFEPIme (MAXIPIME) 1 g in sodium chloride 0.9 % 100 mL IVPB        1 g 200 mL/hr over 30 Minutes Intravenous  Once 09/22/20 0934 09/22/20 1145   09/22/20 1015  vancomycin (VANCOCIN) IVPB 1000 mg/200 mL premix        1,000 mg 200 mL/hr over 60 Minutes Intravenous  Once 09/22/20 0934 09/22/20 1446   09/12/20 2200  ceFEPIme (MAXIPIME) 2 g in sodium chloride 0.9 % 100  mL IVPB        2 g 200 mL/hr over 30 Minutes Intravenous Every 12 hours 09/12/20 0732 09/14/20 2134   09/11/20 1400  ceFEPIme (MAXIPIME) 2 g in sodium chloride 0.9 % 100 mL IVPB  Status:  Discontinued        2 g 200 mL/hr over 30 Minutes Intravenous Every 8 hours 09/11/20 1126 09/12/20 0732   09/08/20 1000   vancomycin (VANCOREADY) IVPB 2000 mg/400 mL        2,000 mg 200 mL/hr over 120 Minutes Intravenous  Once 09/08/20 0857 09/08/20 1224   09/08/20 1000  ceFEPIme (MAXIPIME) 2 g in sodium chloride 0.9 % 100 mL IVPB  Status:  Discontinued        2 g 200 mL/hr over 30 Minutes Intravenous Every 12 hours 09/08/20 0857 09/11/20 1126   09/08/20 0856  vancomycin variable dose per unstable renal function (pharmacist dosing)  Status:  Discontinued         Does not apply See admin instructions 09/08/20 0857 09/09/20 0935   09/02/20 1600  cefTRIAXone (ROCEPHIN) 1 g in sodium chloride 0.9 % 100 mL IVPB  Status:  Discontinued        1 g 200 mL/hr over 30 Minutes Intravenous Every 24 hours 09/01/20 1811 09/02/20 0838   09/01/20 1800  fluconazole (DIFLUCAN) IVPB 400 mg        400 mg 50 mL/hr over 240 Minutes Intravenous  Once 09/01/20 1749 09/02/20 0603   09/01/20 1530  piperacillin-tazobactam (ZOSYN) IVPB 3.375 g        3.375 g 12.5 mL/hr over 240 Minutes Intravenous Every 8 hours 09/01/20 1514 09/05/20 2111   09/01/20 1000  levofloxacin (LEVAQUIN) tablet 250 mg  Status:  Discontinued        250 mg Oral Daily 08/31/20 1508 08/31/20 1735   08/31/20 1730  cefTRIAXone (ROCEPHIN) 1 g in sodium chloride 0.9 % 100 mL IVPB  Status:  Discontinued        1 g 200 mL/hr over 30 Minutes Intravenous Every 24 hours 08/31/20 1726 09/01/20 1513   08/31/20 1730  azithromycin (ZITHROMAX) 500 mg in sodium chloride 0.9 % 250 mL IVPB  Status:  Discontinued        500 mg 250 mL/hr over 60 Minutes Intravenous Every 24 hours 08/31/20 1726 09/02/20 0838       Time spent: 40 minutes    [name] ANP  [facility] 7 am - 330 pm/M-F for direct patient care and secure chat Please refer to Amion for contact info 100  days

## 2020-12-10 NOTE — Progress Notes (Signed)
NAMEDaleah [name], MRN:  [id number], DOB:  Dec 02, 1950, LOS: 100 ADMISSION DATE:  08/31/2020, CONSULTATION DATE:  09/07/2020 REFERRING MD:  Dr Candiss Norse, CHIEF COMPLAINT:  Acute resp failure  Brief History   70 year old female who was previously diagnosed with Covid 08/23/2020.  Admitted 11/2 with AF-RVR, found to have a perforated duodenal underwent exploratory laparotomy with Phillip Heal patch placement 11/3.  Past Medical History  Covid pneumonia Atrial fibrillation CKD stage III Diabetes mellitus Hypertension Colon cancer Hyperlipidemia  Significant Hospital Events   11/2 Admitted  11/3 OR with findings of perforated duodenal ulcer 11/10 progressive hemorrhagic shock, intubated, transfused, pressors, proned; started on CRRT in PM 11/03 Exploratory laparotomy, Phillip Heal patch, lysis of adhesion for duodenal ulceration postop day 6 11/16 Extubated. Re-intubated overnight due to respiratory distress and hypoxia with decreased mentation 11/18 Bronch, cultures sent 11/19 Hgb down getting blood 11/24 Spiked fever resume empirical antimicrobial therapy 11/26 Hemorrhagic shock, hgb 5.6, increased pressors, CT A/P  11/30 Per palliative "Candace [name] expresses understanding that patient is unfortunately very fragile despite ongoing intensive medical treatment and full mechanical support. She indicates that the family wants to continue with all current interventions despite potential outcomes". 12/08 CRRT discontinued due to clotting 12/09 Family requested transfer to tertiary care [facility]). [facility] denied transfer  12/10 CRRT restarted. Episodes of tachycardia, tachypnea that seem to improve with pain management 12/11 Back in shock. Pressor requirements up. CXR worse. ABX resumed 12/12 Still requiring inc pressors. Had hypoglycemic event. Basal insulin dosing adjusted 12/13 Pressor requirements better. Now hyperglycemic. Re-adjusted Glycemic control  12/14 Changed dilaudid to1/2 dosing from day further. D/c  vasopressin.  Goals of care reconfirmed with daughter.  Patient continues to desire aggressive care.  Not open to discussing any other option, patient family continues to be hopeful that she will be discharged to home with full recovery in spite of multiple attempts by staff to prepare them that this is unlikely scenario 12/15 Dilaudid discontinued sending cortisol for ongoing pressor dependence 12/16 Serum cortisol <20, added stress dose steroids.  PRN Dilaudid, attempting not go back on Dilaudid infusion 12/17 Developed worsening tachycardia during the evening hours received initially IV albumin, followed by resuming IV Dilaudid with question of suboptimal pain control.  Currently looks better back on Dilaudid drip.  We have been able to wean pressors after adding stress dose steroids; near arrest - bradycardia, better with atropine  12/19 Afebrile . Remains on dilaudid and heparin gtt, dilaudid gtt increased overnight for concern of pain / ongoing tachycardia, no other events . NE and precedex off 12/17. Ongoing CRRT- even UF, dosing lokelmia this morning 12/20 On CRRT.  Renal plans for HD cath and moving to HD. Getting wound care  12/23 -No vent weaning per RT, remains on full support, #8 trach in place but previously had #6, no documentation of change noted in chart Afebrile / WBC 20.4  Vent - 30% FiO2, PEEP 5 Glucose range 176-212 I/O 465 ml stool, 3.6L removed with HD, -1.1L in last 24 hours  RN reports ongoing periods of tachypnea / vent dyssynchrony that responds to dilaudid   12/24 - renal stopping CRRT today and plans HD 10/24/20 . 40% fio2 on vent via Trach. TAchypenic and tachycardic. Afebrile but wbc up to 27.6K. On TF. Onn heparin gtt  12/25 - Back on CRRT. On vent via trach at 40% fio2, On scheduled dilaudid as add on to oxy. Per family request 12/24 - they felt scheduled oxy was not adequate and patient was showing  signs of opioid withdrawal.  Patient also had  worsening SIRS/sepsis  syndrome. Had fever, rising wbc, worsening RR 40-60 and HR 140s sinus-> started On abx yesterday. Fever some better today. WBC plateau at 28,.5K. On new levophed -> signifanct improvement in HR 77 and RR t0 20. On heparin gtt.  On precedex gtt. On levophed gtt 22mcg wthi midodrine. On TF 12/30 Remains on CRRT with intermittent pressor requirements. Ethics consult placed evening of 12/29. Ethics rec time trial of CRRT 12/31 failed SBT with RR 40s. Several conversations between care tam and family, who are upset RE plan of care 1/1 back on pressors  1/4: On pressors, keeping even on CVVHD, HGB drop to 6.9, transfused 1 unit. Improving mental status 1/10 remains on low dose levophed 79mcg 1/11 off levo, attempting HD with UF for first time.  -ethics consulted 12/30-- recs for time limited trial of CRRT, please see separate Ethics consult note for full details. Futility policy reviewed with family by ethics. Ongoing meetings with Dr. Hulen Skains with nursing administration and family. The ultimate goal is to transition her safely to HD from CRRT. 11/16/2020 again will be placed on intermittent hemodialysis. 11/16/2018 wheeze criteria for transition to progressive care since she has been off the ventilator for 72 hours. With an order for transfer to progressive care. 11/18/2020 is doing well on progressive care unit. 1/24 copious secretions 1/27 downsized to #6 cuffed 1/29 rapid response called during HD for SVT 2/8 try capping trach  Consults:  Cardiology [facility] Nephrology  Ethics  Procedures:  R PICC 11/5 >>out A line 11/9 >> out ETT 11/9 > 11/16, 11/16 >> 09/21/2020, 09/21/2020 tracheostomy>> Lt [place] CVL 11/9 >> R IJ trialysis >> out HD catheter 12/1 >>12/20 12/21 - 14.5 Fr, 23 cm right IJ tunneled hemodialysis catheter placement.  Removal of indwelling subclavian catheter.   Significant Diagnostic Tests:  11/3 CT abd/ pelvis > 1. Positive for bowel perforation: Pneumoperitoneum and intermediate  density free fluid in the abdomen. Prior total colectomy. The specific site of perforation is unclear-oral contrast present to the proximal jejunum has not obviously leaked. Note that there may be small bowel loops adherent to the ventral abdominal wall along the greater curve of the stomach. 2. Extensive bilateral lower lung pneumonia. No pleural effusion. 3. Other abdominal and pelvic viscera are stable since 2015, including bilateral adrenal adenomas. Chronic renal parapelvic cysts. 4. Aortic Atherosclerosis 11/3 TTE > EF 70-75%, RV not well visualized, mildly reduced RV systolic function 50/53 CT chest/ abd/ pelvis> 1. Interval progression of diffuse bilateral hazy ground-glass airspace opacities with more focal areas of consolidation at the lung bases 2. Trace bilateral pleural effusions. 3. Postsurgical changes the abdomen as detailed above. No evidence for a postoperative abscess, however evaluation is limited by lack of IV contrast. 4. There is a 1.9 cm cystic appearing lesion located in the pancreatic body. This was not present on the patient's CT from 2015.  Follow-up with an outpatient contrast enhanced MRI is recommended. 5. The endometrial stripe appears diffusely thickened. Follow-up with pelvic ultrasound is recommended. Aortic Atherosclerosis 11/14 LE doppler studies > + DVT of right posterior tibial and peroneal vein, +dVT of left posterior tibial vein  11/26 CT ABD PEL > liver unremarkable, distended gallbladder with layering tiny gallstones versus sludge, no duct dilatation, mild hyperdensity right upper pole renal collecting system new.  No evidence of retroperitoneal bleeding.  Multifocal lower lobe predominant pulmonary infiltrates/pneumonia.  Small left pleural effusion. 1/16 Venous duplex RUE >> negative  Micro Data:  11/10 MRSA PCR > neg 11/10 BC x 2 > neg 09/22/2020 blood cultures x2>> S epi.  09/22/2020 sputum culture>> MRSA Blood 12/1 >> negative. S.epi 1 out of 2,  likely contaminant BCx 2 12/12 >> neg xxx Trach aspirate 12/24 - FEW STAPHYLOCOCCUS AUREUS  Blood 12/24 > Negative  Blood 1/2: neg Sputum 1/4: MSSA resp 1/24 MSSA  Antimicrobials:  Per primary  Subjective:   Trach CDI  Objective   Blood pressure 116/66, pulse (!) 116, temperature 99.3 F (37.4 C), temperature source Oral, resp. rate 20, height 5\' 8"  (1.727 m), weight 92.6 kg, SpO2 100 %.        Intake/Output Summary (Last 24 hours) at 12/10/2020 0834 Last data filed at 12/10/2020 0406 Gross per 24 hour  Intake --  Output 1100 ml  Net -1100 ml   Filed Weights   Physical Exam  Awake alert no acute distress at rest #4 uncuffed tracheostomy is in place with In place No respiratory distress noted   Resolved problems:  Previously hemorrhagic shock requiring PRBCs, Septic shock, MRSA pneumonia s/p 8 days abx Complicated by MRSA healthcare associated pneumonia (treated) Peritonitis and perforated ulcer post repair 09/01/20   Assessment:  Candace [name] is a 70 y.o. with history of COVID 19 infection requiring hospitalization in October 2021 with current admission for hemorrhagic shock secondary to perforated gastric ulcer requiring surgical intervention and Engineer, structural. Her pulmonary issues are as follows:   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 status post tracheostomy Stable bilateral pneumonia VDRF resolved 2/8 try capping trach. 12/10/2020 has been capped since 12/07/2020.  Reportedly has been suction via trach couple of occasions.  Therefore we will leave On throughout the weekend and reassess again on Monday.   Best practice:  Per primary   Gaylyn Lambert ACNP Acute Care Nurse Practitioner [place] Please consult Amion 12/10/2020, 8:34 AM

## 2020-12-10 NOTE — Progress Notes (Signed)
CSW and Sharyn Lull, RN CM met with patient and her daughter at bedside.  Madilyn Fireman, MSW, LCSW Transitions of Care  Clinical Social Worker II [phone]

## 2020-12-11 DIAGNOSIS — F419 Anxiety disorder, unspecified: Secondary | ICD-10-CM

## 2020-12-11 LAB — CBC
HCT: 25.1 % — ABNORMAL LOW (ref 36.0–46.0)
Hemoglobin: 7.9 g/dL — ABNORMAL LOW (ref 12.0–15.0)
MCH: 27 pg (ref 26.0–34.0)
MCHC: 31.5 g/dL (ref 30.0–36.0)
MCV: 85.7 fL (ref 80.0–100.0)
Platelets: 283 10*3/uL (ref 150–400)
RBC: 2.93 MIL/uL — ABNORMAL LOW (ref 3.87–5.11)
RDW: 18.4 % — ABNORMAL HIGH (ref 11.5–15.5)
WBC: 15.7 10*3/uL — ABNORMAL HIGH (ref 4.0–10.5)
nRBC: 0.2 % (ref 0.0–0.2)

## 2020-12-11 LAB — COMPREHENSIVE METABOLIC PANEL
ALT: 13 U/L (ref 0–44)
AST: 15 U/L (ref 15–41)
Albumin: 1.8 g/dL — ABNORMAL LOW (ref 3.5–5.0)
Alkaline Phosphatase: 63 U/L (ref 38–126)
Anion gap: 9 (ref 5–15)
BUN: 66 mg/dL — ABNORMAL HIGH (ref 8–23)
CO2: 18 mmol/L — ABNORMAL LOW (ref 22–32)
Calcium: 8.3 mg/dL — ABNORMAL LOW (ref 8.9–10.3)
Chloride: 113 mmol/L — ABNORMAL HIGH (ref 98–111)
Creatinine, Ser: 0.64 mg/dL (ref 0.44–1.00)
GFR, Estimated: >60 mL/min (ref >60)
Glucose, Bld: 103 mg/dL — ABNORMAL HIGH (ref 70–99)
Potassium: 2.6 mmol/L — CL (ref 3.5–5.1)
Sodium: 140 mmol/L (ref 135–145)
Total Bilirubin: 0.4 mg/dL (ref 0.3–1.2)
Total Protein: 5.8 g/dL — ABNORMAL LOW (ref 6.5–8.1)

## 2020-12-11 LAB — GLUCOSE, CAPILLARY
Glucose-Capillary: 117 mg/dL — ABNORMAL HIGH (ref 70–99)
Glucose-Capillary: 121 mg/dL — ABNORMAL HIGH (ref 70–99)
Glucose-Capillary: 135 mg/dL — ABNORMAL HIGH (ref 70–99)
Glucose-Capillary: 156 mg/dL — ABNORMAL HIGH (ref 70–99)
Glucose-Capillary: 163 mg/dL — ABNORMAL HIGH (ref 70–99)
Glucose-Capillary: 198 mg/dL — ABNORMAL HIGH (ref 70–99)

## 2020-12-11 LAB — MAGNESIUM: Magnesium: 1.2 mg/dL — ABNORMAL LOW (ref 1.7–2.4)

## 2020-12-11 MED ORDER — LIDOCAINE-PRILOCAINE 2.5-2.5 % EX CREA: 1.0000 "application " | TOPICAL_CREAM | CUTANEOUS | Status: DC | PRN

## 2020-12-11 MED ORDER — SODIUM CHLORIDE 0.9 % IV SOLN: 100.0000 mL | INTRAVENOUS | Status: DC | PRN

## 2020-12-11 MED ORDER — LIDOCAINE HCL (PF) 1 % IJ SOLN: 5.0000 mL | INTRAMUSCULAR | Status: DC | PRN

## 2020-12-11 MED ORDER — ALTEPLASE 2 MG IJ SOLR
INTRAMUSCULAR | Status: AC
  Filled 2020-12-11: qty 4

## 2020-12-11 MED ORDER — PENTAFLUOROPROP-TETRAFLUOROETH EX AERO: 1.0000 "application " | INHALATION_SPRAY | CUTANEOUS | Status: DC | PRN

## 2020-12-11 MED ORDER — HEPARIN SODIUM (PORCINE) 1000 UNIT/ML DIALYSIS: 1000.0000 [IU] | INTRAMUSCULAR | Status: DC | PRN

## 2020-12-11 MED ORDER — ALTEPLASE 2 MG IJ SOLR
2.0000 mg | Freq: Once | INTRAMUSCULAR | Status: AC | PRN
  Administered 2020-12-11: 2 mg

## 2020-12-11 MED ORDER — DARBEPOETIN ALFA 100 MCG/0.5ML IJ SOSY
PREFILLED_SYRINGE | INTRAMUSCULAR | Status: AC
  Filled 2020-12-11: qty 0.5

## 2020-12-11 MED ORDER — CARBAMIDE PEROXIDE 6.5 % OT SOLN
10.0000 [drp] | Freq: Two times a day (BID) | OTIC | Status: DC
  Administered 2020-12-11 – 2020-12-23 (×15): 10 [drp] via OTIC
  Filled 2020-12-11: qty 15

## 2020-12-11 MED ORDER — CARBAMIDE PEROXIDE 6.5 % OT SOLN: 5.0000 [drp] | Freq: Two times a day (BID) | OTIC | Status: DC

## 2020-12-11 NOTE — Progress Notes (Signed)
[facility] NEPHROLOGY PROGRESS NOTE  Assessment/ Plan:  # End-stage renal disease from prolonged dialysis dependent acute kidney Injury: Suspected to be multifactorial acute kidney injury from septic shock/MRSA pneumonia and hemorrhagic shock at the outside.  Has been on renal replacement therapy since 09/08/2020 and recently able to transition to intermittent hemodialysis from CRRT.  Intermittent hemodialysis has been challenging due to hypotension and positional discomfort from decubitus ulcer.  Creatinine remains misleadingly low and is indicative of significant lean muscle mass loss with prolonged hospitalization; urine output incompletely charted.  She is not a candidate for CIR and family have no desire to have her admitted to SNF or LTAC and intend to take her home upon discharge.  Continue TTS dialysis while here. Last HD on 2/10 with 600 cc UF, next HD today.  Minimize lab draws.  #Hypokalemia: We will do dialysis with high potassium bath, 4K.  Encourage oral intake.  No need to restrict potassium in her diet.  # History of Covid pneumonia with ARDS: Status post tracheostomy and respiratory status appears to be stable with supplementation via T-bar.  # Atrial fibrillation with rapid ventricular response: on metoprolol, tachycardic.  On anticoagulation with Eliquis.  # Severe protein calorie malnutrition with large sacral decubitus ulcer: Ongoing nutritional supplementation with wound care.  # CKD-MBD: Calcium and phosphorus level acceptable.  Continue monitor.  # Anemia: Complicated by GI bleed from perforated duodenal ulcer status post exploratory laparotomy with Phillip Heal patch placement.    Receiving IV iron and ESA.  Monitor hemoglobin.  Subjective: Seen and examined.  She looks comfortable, alert awake and following commands.  No new event. Objective Vital signs in last 24 hours: Vitals:   12/11/20 0320 12/11/20 0445 12/11/20 0804 12/11/20 0810  BP:  121/84 102/62    Pulse: (!) 115 (!) 113 (!) 115 (!) 114  Resp: (!) _0 (!) 23  Temp:  98.5 F (36.9 C) 98.6 F (37 C)   TempSrc:  Oral Oral   SpO2: 98% 100% 100% 99%  Height:       Weight change:   Intake/Output Summary (Last 24 hours) at 12/11/2020 0837 Last data filed at 12/11/2020 0448 Gross per 24 hour  Intake 26 ml  Output 400 ml  Net -374 ml       Labs: Basic Metabolic Panel: Recent Labs  Lab 12/07/20 0550 12/08/20 0303 12/09/20 0903 12/11/20 0643  NA 136 136 137 140  K 3.4* 3.3* 3.1* 2.6*  CL 106 101 102 113*  CO2 19* 23 23 18*  GLUCOSE 147* 203* 163* 103*  BUN 46* 23 63* 66*  CREATININE 0.90 0.95 1.00 0.64  CALCIUM 10.0 8.8* 10.7* 8.3*  PHOS 3.5 2.8 3.2  --    Liver Function Tests: Recent Labs  Lab 12/08/20 0303 12/09/20 0903 12/11/20 0643  AST  --   --  15  ALT  --   --  13  ALKPHOS  --   --  63  BILITOT  --   --  0.4  PROT  --   --  5.8*  ALBUMIN 2.2* 2.2* 1.8*   No results for input(s): LIPASE, AMYLASE in the last 168 hours. No results for input(s): AMMONIA in the last 168 hours. CBC: Recent Labs  Lab 12/06/20 0117 12/07/20 0550 12/08/20 0303 12/09/20 0903 12/11/20 0643  WBC 13.7* 17.2* 17.5* 16.5* 15.7*  NEUTROABS 9.9* 12.1* 12.9*  --   --   HGB 7.7* 7.4* 7.8* 8.1* 7.9*  HCT 26.2* 25.2*  24.8* 26.8* 25.1*  MCV 87.6 86.9 86.1 87.9 85.7  PLT 355 395 393 443* 283   Cardiac Enzymes: No results for input(s): CKTOTAL, CKMB, CKMBINDEX, TROPONINI in the last 168 hours. CBG: Recent Labs  Lab 12/10/20 1552 12/10/20 2022 12/11/20 0019 12/11/20 0429 12/11/20 0729  GLUCAP 204* 241* 163* 135* 121*    Iron Studies: No results for input(s): IRON, TIBC, TRANSFERRIN, FERRITIN in the last 72 hours. Studies/Results: No results found.  Medications: Infusions: . albumin human 25 g (11/16/20 1516)    Scheduled Medications: . acetaminophen (TYLENOL) oral liquid 160 mg/5 mL  650 mg Oral Q6H  . apixaban  2.5 mg Oral BID  . chlorhexidine gluconate  (MEDLINE KIT)  15 mL Mouth Rinse BID  . Chlorhexidine Gluconate Cloth  6 each Topical Q0600  . cholecalciferol  2,000 Units Oral Daily  . collagenase   Topical BID  . darbepoetin (ARANESP) injection - DIALYSIS  100 mcg Intravenous Q Sat-HD  . diclofenac Sodium  2 g Topical QID  . feeding supplement (NEPRO CARB STEADY)  237 mL Oral TID BM  . feeding supplement (NEPRO CARB STEADY)  780 mL Per Tube Q24H  . feeding supplement (PROSource TF)  45 mL Per Tube TID  . guaiFENesin  15 mL Oral Q12H  . insulin aspart  0-6 Units Subcutaneous TID WC  . mouth rinse  15 mL Mouth Rinse QID  . megestrol  200 mg Oral QID  . metoprolol tartrate  37.5 mg Oral BID  . midodrine  40 mg Oral 3 times per day on Tue Thu Sat  . multivitamin  1 tablet Oral QHS  . nutrition supplement (JUVEN)  1 packet Per Tube BID BM  . nystatin   Topical BID  . pantoprazole  40 mg Oral Daily  . sodium chloride flush  10-40 mL Intracatheter Q12H    have reviewed scheduled and prn medications.  Physical Exam: General:NAD, comfortable, tracheostomy Heart:RRR, s1s2 nl Lungs:clear b/l, no crackle Abdomen:soft, Non-tender, non-distended Extremities: Trace dependent edema. Dialysis Access: Right IJ TDC in place. Neurology: Alert awake and following commands.  Dron Prasad Bhandari 12/11/2020,8:37 AM  LOS: 101 days

## 2020-12-11 NOTE — Progress Notes (Addendum)
Performing patient rounds. Pt is asleep and appears to be comfortable.

## 2020-12-11 NOTE — Progress Notes (Signed)
Patient tray brought to bedside, tech came into check sugar and nurse to give meds, patient upset that neither could stay and feed her at the moment. Explained that all blood sugars needed to be completed first. Patient stated she had dialysis and would not get to eat, told patient dialysis not scheduled yet.  Patient wanted to feed herself.  Repositioned patient per protocol for safety in swallowing.  Patient upset saying she only needed her head up and she could bend her neck.  Explained this was not correct and repositioned patient for her saftey. Patient unhappy. Stayed with patient charting while she feed herself for supervision.

## 2020-12-11 NOTE — Progress Notes (Signed)
Pt labs drawn. Pt tolerated well and continued to sleep. Call light at pts reach

## 2020-12-11 NOTE — Progress Notes (Addendum)
Pt was given medicine for pain and anxiety through cortrak. Pt was asleep and snoring soundly. Did awaken, but for a few seconds . Advised pt I had given requested pain meds , to which she acknowledged. Pt was left to what seemed a comfortable position for her. Call light, remote and phone at pts reach. All needs met,

## 2020-12-11 NOTE — Progress Notes (Addendum)
Pt has been checked on an hourly basis. She has been asleep.  @0400  Pt ask to be turned. Stated she has been asking to be turned about 5 hours. I reminded pt that around a little after 0100, I told her I would be off the floor with another pt.At said time she did not have any requests.  Advised pt when I returned she was asleep and did not wake her bc I didn't want to disturb her.  Myself and tech Atesha repositioned pt to what she felt was comfortable. Pt was given Voltaren and had her legs massaged bilaterally. She requested to have ginger ale with ice, to which she tolerated fairly well. Usually pt is positioned either on her left side and then her right side at next repositioning. Pt stated she wanted to try laying on her back. Tech Atesha reminded pt she needed to stay off her back to avoid further harm to her buttock wound. Pt stated she wanted to at least try and see how she felt. Pt request was made, to which she did not feel comfortable and was laid on left side per her request . Pt initially stated she felt "wet" and wanted me to check. I advised pt she was not wet but that I would change her purewick and bedding to ease her mind. Once linen was changed & Purewick replaced, I physically showed pt that her previous linen was in fact not wet to which she acknowledged.   I also cleaned, dried up drainage from pts cortrak . Pt told me about her new wheelchair and how she was able to use it for the first time. Stated it made her feel slightly more independent. Pt ask me to find an item and described it as being orange in color and round. Stated her daughter had brought it to her to help her exercise. I looked around the room and closet and could not locate the item . She said to just leave It since I could not locate the item. Her final request was that she not have the Tylenol @0600  & would rather have the Oxycodone before she left for Dialysis. Call light, remote and telephone left at pts reach.

## 2020-12-11 NOTE — Progress Notes (Signed)
Patient on the LLS team.  Seen at bedside with daughter.  Patient's HR up as patient expresses frustration as she feels she is not being heard by staff- especially regarding her requests for positioning.  Patient states that when she requests certain positions, that she is not placed in the position she requested (ie on a side she may have recently been on or even on her back).  Patient and I discussed why the nurses encourage her not to be on her back and to rotate to different positions to avoid any injury/breakdown to skin.   Will discuss with nursing to continue to educate patient but also to listen and respect her wishes/requests.  Patient anxious about dialysis today.  Daughter at bedside and working with patient on strengthening exercises.   Renal social worker to inquire about HD facility that allows for HD on stretcher on Monday. Eulogio Bear DO

## 2020-12-12 LAB — GLUCOSE, CAPILLARY
Glucose-Capillary: 116 mg/dL — ABNORMAL HIGH (ref 70–99)
Glucose-Capillary: 159 mg/dL — ABNORMAL HIGH (ref 70–99)
Glucose-Capillary: 178 mg/dL — ABNORMAL HIGH (ref 70–99)
Glucose-Capillary: 253 mg/dL — ABNORMAL HIGH (ref 70–99)

## 2020-12-12 LAB — RENAL FUNCTION PANEL
Albumin: 2.5 g/dL — ABNORMAL LOW (ref 3.5–5.0)
Anion gap: 12 (ref 5–15)
BUN: 76 mg/dL — ABNORMAL HIGH (ref 8–23)
CO2: 20 mmol/L — ABNORMAL LOW (ref 22–32)
Calcium: 10.2 mg/dL (ref 8.9–10.3)
Chloride: 99 mmol/L (ref 98–111)
Creatinine, Ser: 1.05 mg/dL — ABNORMAL HIGH (ref 0.44–1.00)
GFR, Estimated: 58 mL/min — ABNORMAL LOW (ref >60)
Glucose, Bld: 220 mg/dL — ABNORMAL HIGH (ref 70–99)
Phosphorus: 4.1 mg/dL (ref 2.5–4.6)
Potassium: 4.2 mmol/L (ref 3.5–5.1)
Sodium: 131 mmol/L — ABNORMAL LOW (ref 135–145)

## 2020-12-12 NOTE — Progress Notes (Signed)
  Speech Language Pathology Treatment: Dysphagia  Patient Details Name: Candace [name] MRN: [id number] DOB: December 27, 1950 Today's Date: 12/12/2020 Time: 4098-1191 SLP Time Calculation (min) (ACUTE ONLY): 13 min  Assessment / Plan / Recommendation Clinical Impression  Pt was seen for dysphagia treatment with her daughter present and pt was cooperative throughout the session. Pt's trach remains capped. Pt, nursing, NT and her daughter reported that the pt has been tolerating the current diet without difficulty including overt s/sx of aspiration. Pt's daughter reported that the pt's intake has improved since her liquids have been upgraded to thin consistency and that she has been enjoying cereals more though she is "not really a breakfast person." Pt's daughter asked whether the pt's full supervision requirement could be lifted. She was advised that it will be changed to intermittent supervision. She verbalized agreement, but requested that it be specified that the pt will need assistance for cutting some foods up and that the pt is not fully independent with feeding. Pt's swallowing precautions sign was modified to reflect intermittent supervision with need to setup, cutting meals, etc. and pt's daughter verbalized gratitude. Pt was repositioned to 40 degrees and tolerated 100% of thin liquids trials via straw without overt s/sx of aspiration even when consecutive swallows were used. Pt requested that solids be deferred since she was not hungry as yet. Pt's oropharyngeal swallow mechanism is WFL once swallowing precautions are observed. Pt, family, and staff have verbalized understanding regarding swallowing precautions. It is recommended that the current diet be continued. Further skilled SLP services are not clinically indicated at this time; pt and her daughter were educated regarding this and both parties verbalized agreement.   HPI HPI: 70 year old female who was previously diagnosed with Covid 08/23/2020.   Admitted 11/2 and intubated with AF-RVR, found to have a perforated duodenal underwent exploratory laparotomy with Phillip Heal patch placement 11/3, Intubated for 20 days until 11/23 then trach placed. Prolonged ventilation, until 1/17. Profoundly deconditioned. Trach tube downsized to #4 cuffless Shiley Flex on 2/4.      SLP Plan  All goals met;Discharge SLP treatment due to (comment)       Recommendations  Diet recommendations: Regular;Thin liquid Liquids provided via: Cup;Straw Medication Administration: Crushed with puree (pills may be taken whole with puree if pt prefers) Supervision: Staff to assist with self feeding;Intermittent supervision Compensations: Slow rate;Small sips/bites Postural Changes and/or Swallow Maneuvers: Seated upright 90 degrees (at least 40 degrees)      Patient may use Passy-Muir Speech Valve: During all waking hours (remove during sleep);During PO intake/meals PMSV Supervision: Intermittent         Oral Care Recommendations: Oral care BID Follow up Recommendations: 24 hour supervision/assistance;None SLP Visit Diagnosis: Dysphagia, unspecified (R13.10) Plan: All goals met;Discharge SLP treatment due to (comment)       Kenly Henckel I. Hardin Negus, [place], [place] Office number [phone] Pager [place] 12/12/2020, 1:43 PM

## 2020-12-12 NOTE — Progress Notes (Signed)
[facility] NEPHROLOGY PROGRESS NOTE  Assessment/ Plan:  # End-stage renal disease from prolonged dialysis dependent acute kidney Injury: Suspected to be multifactorial acute kidney injury from septic shock/MRSA pneumonia and hemorrhagic shock at the outside.  Has been on renal replacement therapy since 09/08/2020 and recently able to transition to intermittent hemodialysis from CRRT.  Intermittent hemodialysis has been challenging due to hypotension and positional discomfort from decubitus ulcer.  Creatinine remains misleadingly low and is indicative of significant lean muscle mass loss with prolonged hospitalization.  She is not a candidate for CIR and family have no desire to have her admitted to SNF or LTAC and intend to take her home upon discharge.  Continue TTS dialysis while here. Last HD on 2/12 with 1000 cc UF.  Seems like urine output is picking up therefore plan to monitor labs next few days, watch for renal recovery.  If no improvement then the dialysis on 2/15.  #Hypokalemia: Dialysis with high potassium bath, encourage oral intake and monitor lab.  Repeat renal panel today.  No need to restrict potassium in her diet.  # History of Covid pneumonia with ARDS: Status post tracheostomy and respiratory status appears to be stable with supplementation via T-bar.  # Atrial fibrillation with rapid ventricular response: on metoprolol, tachycardic.  On anticoagulation with Eliquis.  # Severe protein calorie malnutrition with large sacral decubitus ulcer: Ongoing nutritional supplementation with wound care.  # CKD-MBD: Calcium and phosphorus level acceptable.  Continue monitor.  # Anemia: Complicated by GI bleed from perforated duodenal ulcer status post exploratory laparotomy with Phillip Heal patch placement.    Received IV iron and continue ESA.  Monitor hemoglobin.  Subjective: Seen and examined.  Resting comfortably.  Last dialysis yesterday.  No new event.  Objective Vital  signs in last 24 hours: Vitals:   12/11/20 2033 12/11/20 2332 12/12/20 0355 12/12/20 0415  BP: 130/68   110/70  Pulse: (!) 103 (!) 108 84   Resp: (!) 21 (!) 22 19 19   Temp:    98.6 F (37 C)  TempSrc:    Oral  SpO2:    100%  Height:       Weight change:   Intake/Output Summary (Last 24 hours) at 12/12/2020 0833 Last data filed at 12/12/2020 0515 Gross per 24 hour  Intake 360 ml  Output 1500 ml  Net -1140 ml       Labs: Basic Metabolic Panel: Recent Labs  Lab 12/07/20 0550 12/08/20 0303 12/09/20 0903 12/11/20 0643  NA 136 136 137 140  K 3.4* 3.3* 3.1* 2.6*  CL 106 101 102 113*  CO2 19* 23 23 18*  GLUCOSE 147* 203* 163* 103*  BUN 46* 23 63* 66*  CREATININE 0.90 0.95 1.00 0.64  CALCIUM 10.0 8.8* 10.7* 8.3*  PHOS 3.5 2.8 3.2  --    Liver Function Tests: Recent Labs  Lab 12/08/20 0303 12/09/20 0903 12/11/20 0643  AST  --   --  15  ALT  --   --  13  ALKPHOS  --   --  63  BILITOT  --   --  0.4  PROT  --   --  5.8*  ALBUMIN 2.2* 2.2* 1.8*   No results for input(s): LIPASE, AMYLASE in the last 168 hours. No results for input(s): AMMONIA in the last 168 hours. CBC: Recent Labs  Lab 12/06/20 0117 12/07/20 0550 12/08/20 0303 12/09/20 0903 12/11/20 0643  WBC 13.7* 17.2* 17.5* 16.5* 15.7*  NEUTROABS 9.9* 12.1* 12.9*  --   --  HGB 7.7* 7.4* 7.8* 8.1* 7.9*  HCT 26.2* 25.2* 24.8* 26.8* 25.1*  MCV 87.6 86.9 86.1 87.9 85.7  PLT 355 395 393 443* 283   Cardiac Enzymes: No results for input(s): CKTOTAL, CKMB, CKMBINDEX, TROPONINI in the last 168 hours. CBG: Recent Labs  Lab 12/11/20 0429 12/11/20 0729 12/11/20 1150 12/11/20 1727 12/11/20 2018  GLUCAP 135* 121* 156* 117* 198*    Iron Studies: No results for input(s): IRON, TIBC, TRANSFERRIN, FERRITIN in the last 72 hours. Studies/Results: No results found.  Medications: Infusions: . albumin human 25 g (11/16/20 1516)    Scheduled Medications: . acetaminophen (TYLENOL) oral liquid 160 mg/5 mL   650 mg Oral Q6H  . apixaban  2.5 mg Oral BID  . carbamide peroxide  10 drop Both EARS BID  . chlorhexidine gluconate (MEDLINE KIT)  15 mL Mouth Rinse BID  . Chlorhexidine Gluconate Cloth  6 each Topical Q0600  . cholecalciferol  2,000 Units Oral Daily  . collagenase   Topical BID  . darbepoetin (ARANESP) injection - DIALYSIS  100 mcg Intravenous Q Sat-HD  . diclofenac Sodium  2 g Topical QID  . feeding supplement (NEPRO CARB STEADY)  237 mL Oral TID BM  . feeding supplement (NEPRO CARB STEADY)  780 mL Per Tube Q24H  . feeding supplement (PROSource TF)  45 mL Per Tube TID  . guaiFENesin  15 mL Oral Q12H  . insulin aspart  0-6 Units Subcutaneous TID WC  . mouth rinse  15 mL Mouth Rinse QID  . megestrol  200 mg Oral QID  . metoprolol tartrate  37.5 mg Oral BID  . midodrine  40 mg Oral 3 times per day on Tue Thu Sat  . multivitamin  1 tablet Oral QHS  . nutrition supplement (JUVEN)  1 packet Per Tube BID BM  . nystatin   Topical BID  . pantoprazole  40 mg Oral Daily  . sodium chloride flush  10-40 mL Intracatheter Q12H    have reviewed scheduled and prn medications.  Physical Exam: General:NAD, resting comfortably, has tracheostomy Heart:RRR, s1s2 nl Lungs:clear b/l, no crackle Abdomen:soft, Non-tender, non-distended Extremities: Trace dependent edema. Dialysis Access: Right IJ TDC in place. Neurology: Alert awake and following commands.  Candace [name] Candace [name] 12/12/2020,8:33 AM  LOS: 102 days

## 2020-12-12 NOTE — Progress Notes (Signed)
Patient on the LLS team.  Went by room x 2 and daughter not present.   Patient feels she is being listened to better and needs are being met over the last 24 hours.  Added debrox to ears-- L>R.  May needs removal of wax from left ear pending results from debrox.  HR <100 today.  Renal social worker to inquire about HD facility that allows for HD on stretcher on Monday. K low yesterday but patient went to HD and today 4.2 Eulogio Bear DO

## 2020-12-13 DIAGNOSIS — I4891 Unspecified atrial fibrillation: Secondary | ICD-10-CM | POA: Diagnosis not present

## 2020-12-13 LAB — RENAL FUNCTION PANEL
Albumin: 2.4 g/dL — ABNORMAL LOW (ref 3.5–5.0)
Anion gap: 12 (ref 5–15)
BUN: 90 mg/dL — ABNORMAL HIGH (ref 8–23)
CO2: 21 mmol/L — ABNORMAL LOW (ref 22–32)
Calcium: 10 mg/dL (ref 8.9–10.3)
Chloride: 99 mmol/L (ref 98–111)
Creatinine, Ser: 1 mg/dL (ref 0.44–1.00)
GFR, Estimated: >60 mL/min (ref >60)
Glucose, Bld: 234 mg/dL — ABNORMAL HIGH (ref 70–99)
Phosphorus: 4.1 mg/dL (ref 2.5–4.6)
Potassium: 4.5 mmol/L (ref 3.5–5.1)
Sodium: 132 mmol/L — ABNORMAL LOW (ref 135–145)

## 2020-12-13 LAB — GLUCOSE, CAPILLARY
Glucose-Capillary: 163 mg/dL — ABNORMAL HIGH (ref 70–99)
Glucose-Capillary: 153 mg/dL — ABNORMAL HIGH (ref 70–99)
Glucose-Capillary: 232 mg/dL — ABNORMAL HIGH (ref 70–99)
Glucose-Capillary: 270 mg/dL — ABNORMAL HIGH (ref 70–99)

## 2020-12-13 MED ORDER — PANTOPRAZOLE SODIUM 40 MG PO PACK
40.0000 mg | PACK | Freq: Every day | ORAL | Status: DC
  Administered 2020-12-14 – 2020-12-17 (×4): 40 mg via ORAL
  Filled 2020-12-13 (×4): qty 20

## 2020-12-13 NOTE — Progress Notes (Signed)
Physical Therapy Wound Treatment Patient Details  Name: Candace [name] MRN: [id number] Date of Birth: 04/03/1951  Today's Date: 12/13/2020 Time: 1105-1206 Time Calculation (min): 61 min  Subjective  Subjective: Pt and daughter overall pleasant and agreeable to hydrotherapy. Patient and Family Stated Goals: heal wound Date of Onset:  (unknown) Prior Treatments: prior hydro and Dakins  Pain Score:  4/10  Wound Assessment  Pressure Injury 09/25/20 Sacrum Bilateral;Medial Deep Tissue Pressure Injury - Purple or maroon localized area of discolored intact skin or blood-filled blister due to damage of underlying soft tissue from pressure and/or shear. Purple, non-blanchable, b (Active)  Wound Image   12/13/20 1244  Dressing Type Barrier Film (skin prep);Negative pressure wound therapy 12/13/20 1244  Dressing Changed 12/13/20 1244  Dressing Change Frequency Monday, Wednesday, Friday 12/13/20 1244  State of Healing Early/partial granulation 12/13/20 1244  Site / Wound Assessment Pink;Yellow;Black 12/13/20 1244  % Wound base Red or Granulating 45% 12/13/20 1244  % Wound base Yellow/Fibrinous Exudate 50% 12/13/20 1244  % Wound base Black/Eschar 5% 12/13/20 1244  % Wound base Other/Granulation Tissue (Comment) 0% 12/13/20 1244  Peri-wound Assessment Intact 12/10/20 1454  Wound Length (cm) 14 cm 12/01/20 1722  Wound Width (cm) 12 cm 12/01/20 1722  Wound Depth (cm) 6 cm 12/01/20 1722  Wound Surface Area (cm^2) 168 cm^2 12/01/20 1722  Wound Volume (cm^3) 1008 cm^3 12/01/20 1722  Tunneling (cm) 0 12/01/20 1722  Undermining (cm) 0 12/01/20 1722  Margins Unattached edges (unapproximated) 12/10/20 1454  Drainage Amount Minimal 12/13/20 1244  Drainage Description Sanguineous 12/13/20 1244  Treatment Debridement (Selective);Negative pressure wound therapy 12/13/20 1244     Hydrotherapy Pulsed lavage therapy - wound location: sacrum Pulsed Lavage with Suction (psi): 12 psi Pulsed Lavage with  Suction - Normal Saline Used: 1000 mL Pulsed Lavage Tip: Tip with splash shield Selective Debridement Selective Debridement - Location: sacrum Selective Debridement - Tools Used: Forceps;Scissors Selective Debridement - Tissue Removed: unviable tissue   Wound Assessment and Plan  Wound Therapy - Assess/Plan/Recommendations Wound Therapy - Clinical Statement: Wound bed continues to improve, up to 45% granulation tissue with beefier appearance. No active bleeding noted this session. Will continue to follow. Wound Therapy - Functional Problem List: Global weakness and immobility Factors Delaying/Impairing Wound Healing: Diabetes Mellitus;Immobility;Multiple medical problems;Other (comment) (poor nutrition) Hydrotherapy Plan: Debridement;Pulsatile lavage with suction;Patient/family education;Dressing change Wound Therapy - Frequency: 6X / week Wound Therapy - Follow Up Recommendations: Skilled nursing facility Wound Plan: see above  Wound Therapy Goals- Improve the function of patient's integumentary system by progressing the wound(s) through the phases of wound healing (inflammation - proliferation - remodeling) by: Decrease Necrotic Tissue to: 40 Decrease Necrotic Tissue - Progress: Progressing toward goal Increase Granulation Tissue to: 60 Increase Granulation Tissue - Progress: Progressing toward goal Goals/treatment plan/discharge plan were made with and agreed upon by patient/family: Yes Time For Goal Achievement: 7 days Wound Therapy - Potential for Goals: Fair  Goals will be updated until maximal potential achieved or discharge criteria met.  Discharge criteria: when goals achieved, discharge from hospital, MD decision/surgical intervention, no progress towards goals, refusal/missing three consecutive treatments without notification or medical reason.  GP    [name], PT, DPT Acute Rehabilitation Services Pager [phone] Office [phone]   [name] 12/13/2020, 12:49 PM

## 2020-12-13 NOTE — Progress Notes (Signed)
Physical Therapy Treatment Patient Details Name: Candace [name] MRN: [id number] DOB: Jul 02, 1951 Today's Date: 12/13/2020    History of Present Illness Pt 70 y.o. female with medical history significant for recent covid pna, obesity, colon cancer s/p colon resection with colostomy bag, HLD, NIDDM2, CKD3 presented to ED after her follow-up nurse advised her to present to ED for elevated HR. +afib, elevated troponins with demand ischemia,  CT scan is positive for bowel perforation with pneumomediastinum 11/03 exp lap with repair of perforated ulcer. 11/10 t/f to ICU- intubated/sedated/proned. Trach placed 11/24. CRRT off 12/8, restarted 12/10-1/5. 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    PT Comments    Continuing work on functional mobility and activity tolerance;  Unfortunately today, Candace [name] had just received her lunch, and she declined OOB to chair transfer, despite our best efforts to encourage and educate re: benefits of being OOB -- especially for lunch;   We took time to discuss next steps for rehab therapies: getting EOB for balance in unsupported sitting, weight shifting, initiating standing trials, and working on transfers to give Candace [name] more repertoire for mobility;   Obtained yellow egg-crate-type foam pads and showed pt and daughter options for using them to pad/bolster, and unweigh tender sacral area to increase sitting tolerance; encouraged pt to get OOB everyday for at least one meal with nursing staff;   I believe it is time to consider getting Candace [name] to the HD recliner for her HD sessions; Options for getting to the HD recliner include using the Maximove, or drawsheeting her bed to the HD recliner (has drop-arms); we can also consider putting the Roho cushion in the HD recliner  Follow Up Recommendations  SNF (While appropriate for post-acute rehab, family refuses SNF or LTACH)      [facility] bed;Other (comment) (hoyer lift, custom wheelchair, air mattress overlay)    Recommendations for Other Services       Precautions / Restrictions Precautions Precautions: Fall Precaution Comments: baseline R colostomy; sacral wound with wound vac, trach capped Required Braces or Orthoses: Other Brace Other Brace: B prafos Restrictions Weight Bearing Restrictions: No    Mobility  Bed Mobility               General bed mobility comments: declined to attempt EOB    Transfers                 General transfer comment: declined  Ambulation/Gait                 Stairs             Wheelchair Mobility    Modified Rankin (Stroke Patients Only)       Balance                                            Cognition Arousal/Alertness: Awake/alert Behavior During Therapy: Flat affect Overall Cognitive Status: Impaired/Different from baseline Area of Impairment: Following commands;Problem solving                       Following Commands: Follows one step commands with increased time     Problem Solving: Slow processing General Comments: Flat affect today, slow responses      Exercises  General Comments General comments (skin integrity, edema, etc.): Daughter present and supportive. Educated on trial of Stedy but pt declined to attempt today. Attempted EOB activity encouragement but pt declined and reported preference to eat lunch      Pertinent Vitals/Pain Pain Assessment: Faces Faces Pain Scale: Hurts little more Pain Location: bottom Pain Descriptors / Indicators: Discomfort Pain Intervention(s): Monitored during session    Home Living                      Prior Function            PT Goals (current goals can now be found in the care plan section) Acute Rehab PT Goals Patient Stated Goal: eat her lunch PT Goal Formulation: With patient/family Time For  Goal Achievement: 12/15/20 (Will need goal update next session) Potential to Achieve Goals: Fair Progress towards PT goals: Not progressing toward goals - comment (Seemed down today; clearly let us know she would rather eat lunch)    Frequency    Min 2X/week      PT Plan Current plan remains appropriate    Co-evaluation PT/OT/SLP Co-Evaluation/Treatment: Yes Reason for Co-Treatment: Complexity of the patient's impairments (multi-system involvement) PT goals addressed during session: Mobility/safety with mobility;Proper use of DME        AM-PAC PT "6 Clicks" Mobility   Outcome Measure  Help needed turning from your back to your side while in a flat bed without using bedrails?: A Lot Help needed moving from lying on your back to sitting on the side of a flat bed without using bedrails?: Total Help needed moving to and from a bed to a chair (including a wheelchair)?: Total Help needed standing up from a chair using your arms (e.g., wheelchair or bedside chair)?: Total Help needed to walk in hospital room?: Total Help needed climbing 3-5 steps with a railing? : Total 6 Click Score: 7    End of Session   Activity Tolerance: Other (comment) (Pt stated clear preference to eat lunch) Patient left: in bed;with call bell/phone within reach;with family/visitor present   PT Visit Diagnosis: Muscle weakness (generalized) (M62.81);Difficulty in walking, not elsewhere classified (R26.2);Pain;Adult, failure to thrive (R62.7);Unsteadiness on feet (R26.81) Pain - Right/Left: Right Pain - part of body: Knee     Time: 1355-1415 PT Time Calculation (min) (ACUTE ONLY): 20 min  Charges:  $Self Care/Home Management: [place], [place] Pager [phone] Office [phone]    Candace [name] 12/13/2020, 6:36 PM

## 2020-12-13 NOTE — Progress Notes (Signed)
[facility] ROUNDING NOTE   Subjective:   Interval History: This is a 70 year old lady with a history of recent Covid pneumonia obesity: Status post colon resection and colostomy bag diabetes mellitus and acute kidney injury requiring dialysis.  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  Blood pressure 111/75 pulse 109 temperature 98.6 O2 sats 100% 2 L nasal cannula   urine output 500 cc 12/13/2019  Sodium 132 potassium 4.5 chloride 99 CO2 21 BUN 90 creatinine 1 glucose 234 calcium 10 phosphorus 4.1 albumin 2.4.  Last dialysis treatment 12/11/2020 with removal of 1.4 L    Objective:  Vital signs in last 24 hours:  Temp:  [98.6 F (37 C)-98.9 F (37.2 C)] 98.9 F (37.2 C) (02/14 0554) Pulse Rate:  [92-112] 105 (02/14 0800) Resp:  [18-26] 18 (02/14 0800) BP: (125-133)/(77-78) 133/78 (02/14 0554) SpO2:  [96 %-100 %] 100 % (02/14 0800) FiO2 (%):  [21 %] 21 % (02/14 0800)  Weight change:  Filed Weights    Intake/Output: I/O last 3 completed shifts: In: 360 [P.O.:360] Out: 1000 [Urine:575; Drains:50; Stool:375]   Intake/Output this shift:  No intake/output data recorded.  General:NAD, resting comfortably, has tracheostomy Heart:RRR, s1s2 nl Lungs:clear b/l, no crackle Abdomen:soft, Non-tender, non-distended Extremities: Trace dependent edema. Dialysis Access: Right IJ TDC in place. Neurology: Alert awake and following commands.   Basic Metabolic Panel: Recent Labs  Lab 12/07/20 0550 12/08/20 0303 12/09/20 0903 12/11/20 0643 12/12/20 1100 12/13/20 0119  NA 136 136 137 140 131* 132*  K 3.4* 3.3* 3.1* 2.6* 4.2 4.5  CL 106 101 102 113* 99 99  CO2 19* 23 23 18* 20* 21*  GLUCOSE 147* 203* 163* 103* 220* 234*  BUN 46* 23 63* 66* 76* 90*  CREATININE 0.90 0.95 1.00 0.64 1.05* 1.00  CALCIUM 10.0 8.8* 10.7* 8.3* 10.2 10.0  MG  --   --   --  1.2*  --   --   PHOS 3.5 2.8 3.2  --  4.1 4.1     Liver Function Tests: Recent Labs  Lab 12/08/20 0303 12/09/20 0903 12/11/20 0643 12/12/20 1100 12/13/20 0119  AST  --   --  15  --   --   ALT  --   --  13  --   --   ALKPHOS  --   --  63  --   --   BILITOT  --   --  0.4  --   --   PROT  --   --  5.8*  --   --   ALBUMIN 2.2* 2.2* 1.8* 2.5* 2.4*   No results for input(s): LIPASE, AMYLASE in the last 168 hours. No results for input(s): AMMONIA in the last 168 hours.  CBC: Recent Labs  Lab 12/07/20 0550 12/08/20 0303 12/09/20 0903 12/11/20 0643  WBC 17.2* 17.5* 16.5* 15.7*  NEUTROABS 12.1* 12.9*  --   --   HGB 7.4* 7.8* 8.1* 7.9*  HCT 25.2* 24.8* 26.8* 25.1*  MCV 86.9 86.1 87.9 85.7  PLT 395 393 443* 283    Cardiac Enzymes: No results for input(s): CKTOTAL, CKMB, CKMBINDEX, TROPONINI in the last 168 hours.  BNP: Invalid input(s): POCBNP  CBG: Recent Labs  Lab 12/12/20 0843 12/12/20 1201 12/12/20 1724 12/12/20 2113 12/13/20 0824  GLUCAP 116* 159* 178* 253* 163*    Microbiology: Results for orders placed or performed during the hospital encounter of 08/31/20  MRSA PCR Screening  Status: None   Collection Time: 09/08/20  8:17 AM   Specimen: Nasal Mucosa; Nasopharyngeal  Result Value Ref Range Status   MRSA by PCR NEGATIVE NEGATIVE Final    Comment:        The GeneXpert MRSA Assay (FDA approved for NASAL specimens only), is one component of a comprehensive MRSA colonization surveillance program. It is not intended to diagnose MRSA infection nor to guide or monitor treatment for MRSA infections. Performed at [facility], [place] [address] [place], [place] [address]   Culture, blood (Routine X 2) w Reflex to ID Panel     Status: None   Collection Time: 09/08/20  8:35 AM   Specimen: BLOOD LEFT HAND  Result Value Ref Range Status   Specimen Description BLOOD LEFT HAND  Final   Special Requests   Final    BOTTLES DRAWN AEROBIC ONLY Blood Culture results may not be optimal due to an  inadequate volume of blood received in culture bottles   Culture   Final    NO GROWTH 5 DAYS Performed at [facility], [place] [address] [place], [place] [address]    Report Status 09/13/2020 FINAL  Final  Culture, blood (Routine X 2) w Reflex to ID Panel     Status: None   Collection Time: 09/08/20  8:35 AM   Specimen: BLOOD RIGHT HAND  Result Value Ref Range Status   Specimen Description BLOOD RIGHT HAND  Final   Special Requests   Final    BOTTLES DRAWN AEROBIC ONLY Blood Culture results may not be optimal due to an inadequate volume of blood received in culture bottles   Culture   Final    NO GROWTH 5 DAYS Performed at [facility], [place] [address], [place], [place] [address]    Report Status 09/13/2020 FINAL  Final  Culture, respiratory (non-expectorated)     Status: None   Collection Time: 09/16/20  8:35 AM   Specimen: Tracheal Aspirate; Respiratory  Result Value Ref Range Status   Specimen Description TRACHEAL ASPIRATE  Final   Special Requests NONE  Final   Gram Stain   Final    ABUNDANT WBC PRESENT, PREDOMINANTLY PMN RARE YEAST    Culture   Final    FEW Normal respiratory flora-no Staph aureus or Pseudomonas seen Performed at [facility], [address] [place],  [address]    Report Status 09/18/2020 FINAL  Final  Culture, respiratory (non-expectorated)     Status: None   Collection Time: 09/16/20 11:03 AM   Specimen: Bronchoalveolar Lavage; Respiratory  Result Value Ref Range Status   Specimen Description BRONCHIAL ALVEOLAR LAVAGE  Final   Special Requests NONE  Final   Gram Stain   Final    ABUNDANT WBC PRESENT, PREDOMINANTLY PMN RARE GRAM POSITIVE COCCI Performed at [facility], [place] [address], [place],  [address]    Culture FEW CANDIDA ALBICANS  Final   Report Status 09/18/2020 FINAL  Final  Culture, respiratory (non-expectorated)     Status: None   Collection Time: 09/22/20  9:30 AM   Specimen: Tracheal Aspirate;  Respiratory  Result Value Ref Range Status   Specimen Description TRACHEAL ASPIRATE  Final   Special Requests NONE  Final   Gram Stain   Final    RARE WBC PRESENT,BOTH PMN AND MONONUCLEAR RARE GRAM POSITIVE COCCI IN PAIRS RARE GRAM VARIABLE ROD Performed at [facility], [place] [address] [place], [place]  [address]    Culture   Final    MODERATE METHICILLIN RESISTANT STAPHYLOCOCCUS AUREUS   Report Status 09/24/2020 FINAL  Final   Organism ID, Bacteria METHICILLIN RESISTANT STAPHYLOCOCCUS AUREUS  Final      Susceptibility   Methicillin resistant staphylococcus aureus - MIC*    CIPROFLOXACIN <=0.5 SENSITIVE Sensitive     ERYTHROMYCIN <=0.25 SENSITIVE Sensitive     GENTAMICIN <=0.5 SENSITIVE Sensitive     OXACILLIN RESISTANT Resistant     TETRACYCLINE <=1 SENSITIVE Sensitive     VANCOMYCIN 1 SENSITIVE Sensitive     TRIMETH/SULFA <=10 SENSITIVE Sensitive     CLINDAMYCIN 4 RESISTANT Resistant     RIFAMPIN <=0.5 SENSITIVE Sensitive     Inducible Clindamycin NEGATIVE Sensitive     * MODERATE METHICILLIN RESISTANT STAPHYLOCOCCUS AUREUS  Culture, blood (Routine X 2) w Reflex to ID Panel     Status: Abnormal   Collection Time: 09/22/20 10:45 AM   Specimen: BLOOD LEFT HAND  Result Value Ref Range Status   Specimen Description BLOOD LEFT HAND  Final   Special Requests   Final    BOTTLES DRAWN AEROBIC ONLY Blood Culture results may not be optimal due to an inadequate volume of blood received in culture bottles   Culture  Setup Time   Final    GRAM POSITIVE COCCI IN CLUSTERS AEROBIC BOTTLE ONLY CRITICAL RESULT CALLED TO, READ BACK BY AND VERIFIED WITH: PHARMD J MILLEN AT 0911 09/25/20 BY L BENFIELD    Culture (A)  Final    STAPHYLOCOCCUS EPIDERMIDIS THE SIGNIFICANCE OF ISOLATING THIS ORGANISM FROM A SINGLE SET OF BLOOD CULTURES WHEN MULTIPLE SETS ARE DRAWN IS UNCERTAIN. PLEASE NOTIFY THE MICROBIOLOGY DEPARTMENT WITHIN ONE WEEK IF SPECIATION AND SENSITIVITIES ARE REQUIRED. Performed at  [facility], [place] [address] [place], [place] [address]    Report Status 09/26/2020 FINAL  Final  Blood Culture ID Panel (Reflexed)     Status: Abnormal   Collection Time: 09/22/20 10:45 AM  Result Value Ref Range Status   Enterococcus faecalis NOT DETECTED NOT DETECTED Final   Enterococcus Faecium NOT DETECTED NOT DETECTED Final   Listeria monocytogenes NOT DETECTED NOT DETECTED Final   Staphylococcus species DETECTED (A) NOT DETECTED Final    Comment: CRITICAL RESULT CALLED TO, READ BACK BY AND VERIFIED WITH: PHARMD J MILLEN AT 0911 09/25/20 BY L BENFIELD    Staphylococcus aureus (BCID) NOT DETECTED NOT DETECTED Final   Staphylococcus epidermidis DETECTED (A) NOT DETECTED Final    Comment: Methicillin (oxacillin) resistant coagulase negative staphylococcus. Possible blood culture contaminant (unless isolated from more than one blood culture draw or clinical case suggests pathogenicity). No antibiotic treatment is indicated for blood  culture contaminants. CRITICAL RESULT CALLED TO, READ BACK BY AND VERIFIED WITH: PHARMD J MILLEN AT 0911 09/25/20 BY L BENFIELD    Staphylococcus lugdunensis NOT DETECTED NOT DETECTED Final   Streptococcus species NOT DETECTED NOT DETECTED Final   Streptococcus agalactiae NOT DETECTED NOT DETECTED Final   Streptococcus pneumoniae NOT DETECTED NOT DETECTED Final   Streptococcus pyogenes NOT DETECTED NOT DETECTED Final   A.calcoaceticus-baumannii NOT DETECTED NOT DETECTED Final   Bacteroides fragilis NOT DETECTED NOT DETECTED Final   Enterobacterales NOT DETECTED NOT DETECTED Final   Enterobacter cloacae complex NOT DETECTED NOT DETECTED Final   Escherichia coli NOT DETECTED NOT DETECTED Final   Klebsiella aerogenes NOT DETECTED NOT DETECTED Final   Klebsiella oxytoca NOT DETECTED NOT DETECTED Final   Klebsiella pneumoniae NOT DETECTED NOT DETECTED Final  Proteus species NOT DETECTED NOT DETECTED Final   Salmonella species NOT DETECTED NOT  DETECTED Final   Serratia marcescens NOT DETECTED NOT DETECTED Final   Haemophilus influenzae NOT DETECTED NOT DETECTED Final   Neisseria meningitidis NOT DETECTED NOT DETECTED Final   Pseudomonas aeruginosa NOT DETECTED NOT DETECTED Final   Stenotrophomonas maltophilia NOT DETECTED NOT DETECTED Final   Candida albicans NOT DETECTED NOT DETECTED Final   Candida auris NOT DETECTED NOT DETECTED Final   Candida glabrata NOT DETECTED NOT DETECTED Final   Candida krusei NOT DETECTED NOT DETECTED Final   Candida parapsilosis NOT DETECTED NOT DETECTED Final   Candida tropicalis NOT DETECTED NOT DETECTED Final   Cryptococcus neoformans/gattii NOT DETECTED NOT DETECTED Final   Methicillin resistance mecA/C DETECTED (A) NOT DETECTED Final    Comment: CRITICAL RESULT CALLED TO, READ BACK BY AND VERIFIED WITH: PHARMD J MILLEN AT 0911 09/25/20 BY L BENFIELD Performed at [facility], [address] [place], [place] [address]   Culture, blood (Routine X 2) w Reflex to ID Panel     Status: None   Collection Time: 09/22/20 10:52 AM   Specimen: BLOOD LEFT HAND  Result Value Ref Range Status   Specimen Description BLOOD LEFT HAND  Final   Special Requests   Final    BOTTLES DRAWN AEROBIC ONLY Blood Culture results may not be optimal due to an inadequate volume of blood received in culture bottles   Culture   Final    NO GROWTH 5 DAYS Performed at [facility], [place] [address], [place], [place] [address]    Report Status 09/27/2020 FINAL  Final  Culture, blood (Routine X 2) w Reflex to ID Panel     Status: Abnormal   Collection Time: 09/29/20  5:51 PM   Specimen: BLOOD LEFT HAND  Result Value Ref Range Status   Specimen Description BLOOD LEFT HAND  Final   Special Requests   Final    BOTTLES DRAWN AEROBIC ONLY Blood Culture results may not be optimal due to an inadequate volume of blood received in culture bottles   Culture  Setup Time   Final    GRAM POSITIVE COCCI IN  CLUSTERS AEROBIC BOTTLE ONLY Organism ID to follow CRITICAL RESULT CALLED TO, READ BACK BY AND VERIFIED WITH: A. [facility] PharmD 11:45 10/01/20 ([email or web address])    Culture (A)  Final    STAPHYLOCOCCUS EPIDERMIDIS THE SIGNIFICANCE OF ISOLATING THIS ORGANISM FROM A SINGLE SET OF BLOOD CULTURES WHEN MULTIPLE SETS ARE DRAWN IS UNCERTAIN. PLEASE NOTIFY THE MICROBIOLOGY DEPARTMENT WITHIN ONE WEEK IF SPECIATION AND SENSITIVITIES ARE REQUIRED. Performed at [facility], [place] [address] [place], [place] [address]    Report Status 10/03/2020 FINAL  Final  Blood Culture ID Panel (Reflexed)     Status: Abnormal   Collection Time: 09/29/20  5:51 PM  Result Value Ref Range Status   Enterococcus faecalis NOT DETECTED NOT DETECTED Final   Enterococcus Faecium NOT DETECTED NOT DETECTED Final   Listeria monocytogenes NOT DETECTED NOT DETECTED Final   Staphylococcus species DETECTED (A) NOT DETECTED Final    Comment: CRITICAL RESULT CALLED TO, READ BACK BY AND VERIFIED WITH: A. [facility] PharmD 11:45 10/01/20 ([email or web address])    Staphylococcus aureus (BCID) NOT DETECTED NOT DETECTED Final   Staphylococcus epidermidis DETECTED (A) NOT DETECTED Final    Comment: Methicillin (oxacillin) resistant coagulase negative staphylococcus. Possible blood culture contaminant (unless isolated from more than one blood culture draw or clinical case suggests  pathogenicity). No antibiotic treatment is indicated for blood  culture contaminants. CRITICAL RESULT CALLED TO, READ BACK BY AND VERIFIED WITH: A. [facility] PharmD 11:45 10/01/20 (wilsonm)    Staphylococcus lugdunensis NOT DETECTED NOT DETECTED Final   Streptococcus species NOT DETECTED NOT DETECTED Final   Streptococcus agalactiae NOT DETECTED NOT DETECTED Final   Streptococcus pneumoniae NOT DETECTED NOT DETECTED Final   Streptococcus pyogenes NOT DETECTED NOT DETECTED Final   A.calcoaceticus-baumannii NOT DETECTED NOT DETECTED Final   Bacteroides fragilis NOT DETECTED NOT DETECTED  Final   Enterobacterales NOT DETECTED NOT DETECTED Final   Enterobacter cloacae complex NOT DETECTED NOT DETECTED Final   Escherichia coli NOT DETECTED NOT DETECTED Final   Klebsiella aerogenes NOT DETECTED NOT DETECTED Final   Klebsiella oxytoca NOT DETECTED NOT DETECTED Final   Klebsiella pneumoniae NOT DETECTED NOT DETECTED Final   Proteus species NOT DETECTED NOT DETECTED Final   Salmonella species NOT DETECTED NOT DETECTED Final   Serratia marcescens NOT DETECTED NOT DETECTED Final   Haemophilus influenzae NOT DETECTED NOT DETECTED Final   Neisseria meningitidis NOT DETECTED NOT DETECTED Final   Pseudomonas aeruginosa NOT DETECTED NOT DETECTED Final   Stenotrophomonas maltophilia NOT DETECTED NOT DETECTED Final   Candida albicans NOT DETECTED NOT DETECTED Final   Candida auris NOT DETECTED NOT DETECTED Final   Candida glabrata NOT DETECTED NOT DETECTED Final   Candida krusei NOT DETECTED NOT DETECTED Final   Candida parapsilosis NOT DETECTED NOT DETECTED Final   Candida tropicalis NOT DETECTED NOT DETECTED Final   Cryptococcus neoformans/gattii NOT DETECTED NOT DETECTED Final   Methicillin resistance mecA/C DETECTED (A) NOT DETECTED Final    Comment: CRITICAL RESULT CALLED TO, READ BACK BY AND VERIFIED WITH: A. [facility] PharmD 11:45 10/01/20 (wilsonm) Performed at [facility], [place] [address], [place], [place] [address]   Culture, blood (Routine X 2) w Reflex to ID Panel     Status: None   Collection Time: 09/29/20  6:22 PM   Specimen: BLOOD  Result Value Ref Range Status   Specimen Description BLOOD LEFT FOOT  Final   Special Requests   Final    BOTTLES DRAWN AEROBIC ONLY Blood Culture results may not be optimal due to an inadequate volume of blood received in culture bottles   Culture   Final    NO GROWTH 5 DAYS Performed at [facility], [place] [address] [place], [place] [address]    Report Status 10/04/2020 FINAL  Final  Culture, blood (routine x 2)     Status:  None   Collection Time: 10/10/20 11:11 AM   Specimen: BLOOD  Result Value Ref Range Status   Specimen Description BLOOD BLOOD LEFT HAND  Final   Special Requests   Final    BOTTLES DRAWN AEROBIC AND ANAEROBIC Blood Culture adequate volume   Culture   Final    NO GROWTH 5 DAYS Performed at [facility], [place] [address] [place], [place] [address]    Report Status 10/15/2020 FINAL  Final  Culture, blood (routine x 2)     Status: None   Collection Time: 10/10/20 11:11 AM   Specimen: BLOOD  Result Value Ref Range Status   Specimen Description BLOOD LEFT ANTECUBITAL  Final   Special Requests   Final    BOTTLES DRAWN AEROBIC ONLY Blood Culture results may not be optimal due to an inadequate volume of blood received in culture bottles   Culture   Final    NO  GROWTH 5 DAYS Performed at [facility], [place] [address], [place], [place] [address]    Report Status 10/15/2020 FINAL  Final  Culture, respiratory (non-expectorated)     Status: None   Collection Time: 10/22/20  6:14 PM   Specimen: Tracheal Aspirate; Respiratory  Result Value Ref Range Status   Specimen Description TRACHEAL ASPIRATE  Final   Special Requests NONE  Final   Gram Stain   Final    ABUNDANT WBC PRESENT, PREDOMINANTLY PMN ABUNDANT GRAM POSITIVE COCCI Performed at [facility], [place] [address] [place], [place] [address]    Culture FEW STAPHYLOCOCCUS AUREUS  Final   Report Status 10/25/2020 FINAL  Final   Organism ID, Bacteria STAPHYLOCOCCUS AUREUS  Final      Susceptibility   Staphylococcus aureus - MIC*    CIPROFLOXACIN <=0.5 SENSITIVE Sensitive     ERYTHROMYCIN <=0.25 SENSITIVE Sensitive     GENTAMICIN <=0.5 SENSITIVE Sensitive     OXACILLIN <=0.25 SENSITIVE Sensitive     TETRACYCLINE <=1 SENSITIVE Sensitive     VANCOMYCIN 1 SENSITIVE Sensitive     TRIMETH/SULFA <=10 SENSITIVE Sensitive     CLINDAMYCIN <=0.25 SENSITIVE Sensitive     RIFAMPIN <=0.5 SENSITIVE Sensitive     Inducible Clindamycin  NEGATIVE Sensitive     * FEW STAPHYLOCOCCUS AUREUS  Culture, blood (Routine X 2) w Reflex to ID Panel     Status: None   Collection Time: 10/23/20  2:53 AM   Specimen: BLOOD LEFT HAND  Result Value Ref Range Status   Specimen Description BLOOD LEFT HAND  Final   Special Requests   Final    BOTTLES DRAWN AEROBIC AND ANAEROBIC Blood Culture results may not be optimal due to an inadequate volume of blood received in culture bottles   Culture   Final    NO GROWTH 5 DAYS Performed at [facility], [place] [address] [place], [place] [address]    Report Status 10/28/2020 FINAL  Final  Culture, blood (Routine X 2) w Reflex to ID Panel     Status: None   Collection Time: 10/23/20  3:01 AM   Specimen: BLOOD LEFT FOREARM  Result Value Ref Range Status   Specimen Description BLOOD LEFT FOREARM  Final   Special Requests   Final    AEROBIC BOTTLE ONLY Blood Culture results may not be optimal due to an inadequate volume of blood received in culture bottles   Culture   Final    NO GROWTH 5 DAYS Performed at [facility], [place] [address], [place], [place] [address]    Report Status 10/28/2020 FINAL  Final  Culture, blood (Routine X 2) w Reflex to ID Panel     Status: None   Collection Time: 10/31/20 10:13 AM   Specimen: BLOOD  Result Value Ref Range Status   Specimen Description BLOOD PICC LINE  Final   Special Requests   Final    BOTTLES DRAWN AEROBIC AND ANAEROBIC Blood Culture adequate volume   Culture   Final    NO GROWTH 6 DAYS Performed at [facility], [place] [address] [place], [place] [address]    Report Status 11/06/2020 FINAL  Final  Culture, blood (Routine X 2) w Reflex to ID Panel     Status: None   Collection Time: 10/31/20 10:18 AM   Specimen: BLOOD LEFT HAND  Result Value Ref Range Status   Specimen Description BLOOD LEFT HAND  Final   Special Requests  Final    BOTTLES DRAWN AEROBIC ONLY Blood Culture results may not be optimal due to an inadequate volume of  blood received in culture bottles   Culture   Final    NO GROWTH 6 DAYS Performed at [facility], [place] [address] [place], [place] [address]    Report Status 11/06/2020 FINAL  Final  Culture, respiratory (non-expectorated)     Status: None   Collection Time: 11/02/20 10:11 AM   Specimen: Tracheal Aspirate; Respiratory  Result Value Ref Range Status   Specimen Description TRACHEAL ASPIRATE  Final   Special Requests NONE  Final   Gram Stain   Final    MODERATE WBC PRESENT,BOTH PMN AND MONONUCLEAR RARE GRAM POSITIVE COCCI IN PAIRS Performed at [facility], [place] [address] [place], [place] [address]    Culture RARE STAPHYLOCOCCUS AUREUS  Final   Report Status 11/05/2020 FINAL  Final   Organism ID, Bacteria STAPHYLOCOCCUS AUREUS  Final      Susceptibility   Staphylococcus aureus - MIC*    CIPROFLOXACIN <=0.5 SENSITIVE Sensitive     ERYTHROMYCIN <=0.25 SENSITIVE Sensitive     GENTAMICIN <=0.5 SENSITIVE Sensitive     OXACILLIN <=0.25 SENSITIVE Sensitive     TETRACYCLINE <=1 SENSITIVE Sensitive     VANCOMYCIN 1 SENSITIVE Sensitive     TRIMETH/SULFA <=10 SENSITIVE Sensitive     CLINDAMYCIN <=0.25 SENSITIVE Sensitive     RIFAMPIN <=0.5 SENSITIVE Sensitive     Inducible Clindamycin NEGATIVE Sensitive     * RARE STAPHYLOCOCCUS AUREUS  Culture, respiratory (non-expectorated)     Status: None   Collection Time: 11/22/20  4:45 PM   Specimen: Tracheal Aspirate; Respiratory  Result Value Ref Range Status   Specimen Description TRACHEAL ASPIRATE  Final   Special Requests NONE  Final   Gram Stain   Final    ABUNDANT WBC PRESENT,BOTH PMN AND MONONUCLEAR ABUNDANT GRAM POSITIVE COCCI IN PAIRS Performed at [facility], [place] [address], [place], [place] [address]    Culture MODERATE STAPHYLOCOCCUS AUREUS  Final   Report Status 11/25/2020 FINAL  Final   Organism ID, Bacteria STAPHYLOCOCCUS AUREUS  Final      Susceptibility   Staphylococcus aureus - MIC*    CIPROFLOXACIN  <=0.5 SENSITIVE Sensitive     ERYTHROMYCIN <=0.25 SENSITIVE Sensitive     GENTAMICIN <=0.5 SENSITIVE Sensitive     OXACILLIN <=0.25 SENSITIVE Sensitive     TETRACYCLINE <=1 SENSITIVE Sensitive     VANCOMYCIN <=0.5 SENSITIVE Sensitive     TRIMETH/SULFA <=10 SENSITIVE Sensitive     CLINDAMYCIN <=0.25 SENSITIVE Sensitive     RIFAMPIN <=0.5 SENSITIVE Sensitive     Inducible Clindamycin NEGATIVE Sensitive     * MODERATE STAPHYLOCOCCUS AUREUS  Culture, blood (routine x 2)     Status: Abnormal   Collection Time: 12/05/20 11:07 AM   Specimen: BLOOD RIGHT ARM  Result Value Ref Range Status   Specimen Description BLOOD RIGHT ARM  Final   Special Requests   Final    BOTTLES DRAWN AEROBIC ONLY Blood Culture adequate volume   Culture  Setup Time   Final    GRAM POSITIVE COCCI IN CLUSTERS AEROBIC BOTTLE ONLY CRITICAL RESULT CALLED TO, READ BACK BY AND VERIFIED WITH: Tillman Sers PHARMD 2000 12/06/20 A BROWNING    Culture (A)  Final    STAPHYLOCOCCUS EPIDERMIDIS THE SIGNIFICANCE OF ISOLATING THIS ORGANISM FROM A SINGLE SET OF BLOOD CULTURES WHEN MULTIPLE SETS ARE DRAWN IS UNCERTAIN. PLEASE  NOTIFY THE MICROBIOLOGY DEPARTMENT WITHIN ONE WEEK IF SPECIATION AND SENSITIVITIES ARE REQUIRED. Performed at [facility], [place] [address] [place], [place] [address]    Report Status 12/08/2020 FINAL  Final  Culture, blood (routine x 2)     Status: None   Collection Time: 12/05/20 11:07 AM   Specimen: BLOOD LEFT HAND  Result Value Ref Range Status   Specimen Description BLOOD LEFT HAND  Final   Special Requests   Final    BOTTLES DRAWN AEROBIC ONLY Blood Culture adequate volume   Culture   Final    NO GROWTH 5 DAYS Performed at [facility], [place] [address], [place], [place] [address]    Report Status 12/10/2020 FINAL  Final  Blood Culture ID Panel (Reflexed)     Status: Abnormal   Collection Time: 12/05/20 11:07 AM  Result Value Ref Range Status   Enterococcus faecalis NOT DETECTED NOT DETECTED  Final   Enterococcus Faecium NOT DETECTED NOT DETECTED Final   Listeria monocytogenes NOT DETECTED NOT DETECTED Final   Staphylococcus species DETECTED (A) NOT DETECTED Final    Comment: CRITICAL RESULT CALLED TO, READ BACK BY AND VERIFIED WITH: Tillman Sers PHARMD 2000 12/06/20 A BROWNING    Staphylococcus aureus (BCID) NOT DETECTED NOT DETECTED Final   Staphylococcus epidermidis DETECTED (A) NOT DETECTED Final    Comment: Methicillin (oxacillin) resistant coagulase negative staphylococcus. Possible blood culture contaminant (unless isolated from more than one blood culture draw or clinical case suggests pathogenicity). No antibiotic treatment is indicated for blood  culture contaminants. CRITICAL RESULT CALLED TO, READ BACK BY AND VERIFIED WITH: Tillman Sers PHARMD 2000 12/06/20 A BROWNING    Staphylococcus lugdunensis NOT DETECTED NOT DETECTED Final   Streptococcus species NOT DETECTED NOT DETECTED Final   Streptococcus agalactiae NOT DETECTED NOT DETECTED Final   Streptococcus pneumoniae NOT DETECTED NOT DETECTED Final   Streptococcus pyogenes NOT DETECTED NOT DETECTED Final   A.calcoaceticus-baumannii NOT DETECTED NOT DETECTED Final   Bacteroides fragilis NOT DETECTED NOT DETECTED Final   Enterobacterales NOT DETECTED NOT DETECTED Final   Enterobacter cloacae complex NOT DETECTED NOT DETECTED Final   Escherichia coli NOT DETECTED NOT DETECTED Final   Klebsiella aerogenes NOT DETECTED NOT DETECTED Final   Klebsiella oxytoca NOT DETECTED NOT DETECTED Final   Klebsiella pneumoniae NOT DETECTED NOT DETECTED Final   Proteus species NOT DETECTED NOT DETECTED Final   Salmonella species NOT DETECTED NOT DETECTED Final   Serratia marcescens NOT DETECTED NOT DETECTED Final   Haemophilus influenzae NOT DETECTED NOT DETECTED Final   Neisseria meningitidis NOT DETECTED NOT DETECTED Final   Pseudomonas aeruginosa NOT DETECTED NOT DETECTED Final   Stenotrophomonas maltophilia NOT DETECTED NOT DETECTED  Final   Candida albicans NOT DETECTED NOT DETECTED Final   Candida auris NOT DETECTED NOT DETECTED Final   Candida glabrata NOT DETECTED NOT DETECTED Final   Candida krusei NOT DETECTED NOT DETECTED Final   Candida parapsilosis NOT DETECTED NOT DETECTED Final   Candida tropicalis NOT DETECTED NOT DETECTED Final   Cryptococcus neoformans/gattii NOT DETECTED NOT DETECTED Final   Methicillin resistance mecA/C DETECTED (A) NOT DETECTED Final    Comment: CRITICAL RESULT CALLED TO, READ BACK BY AND VERIFIED WITH: Tillman Sers PHARMD 2000 12/06/20 A BROWNING Performed at [facility], [address] [place], [place] [address]     Coagulation Studies: No results for input(s): LABPROT, INR in the last 72 hours.  Urinalysis: No results for input(s): COLORURINE, LABSPEC, [place],  GLUCOSEU, HGBUR, BILIRUBINUR, KETONESUR, PROTEINUR, UROBILINOGEN, NITRITE, LEUKOCYTESUR in the last 72 hours.  Invalid input(s): APPERANCEUR    Imaging: No results found.   Medications:   . albumin human 25 g (11/16/20 1516)   . acetaminophen (TYLENOL) oral liquid 160 mg/5 mL  650 mg Oral Q6H  . apixaban  2.5 mg Oral BID  . carbamide peroxide  10 drop Both EARS BID  . chlorhexidine gluconate (MEDLINE KIT)  15 mL Mouth Rinse BID  . Chlorhexidine Gluconate Cloth  6 each Topical Q0600  . cholecalciferol  2,000 Units Oral Daily  . collagenase   Topical BID  . darbepoetin (ARANESP) injection - DIALYSIS  100 mcg Intravenous Q Sat-HD  . diclofenac Sodium  2 g Topical QID  . feeding supplement (NEPRO CARB STEADY)  237 mL Oral TID BM  . feeding supplement (NEPRO CARB STEADY)  780 mL Per Tube Q24H  . feeding supplement (PROSource TF)  45 mL Per Tube TID  . guaiFENesin  15 mL Oral Q12H  . insulin aspart  0-6 Units Subcutaneous TID WC  . mouth rinse  15 mL Mouth Rinse QID  . megestrol  200 mg Oral QID  . metoprolol tartrate  37.5 mg Oral BID  . midodrine  40 mg Oral 3 times per day on Tue Thu Sat  . multivitamin   1 tablet Oral QHS  . nutrition supplement (JUVEN)  1 packet Per Tube BID BM  . nystatin   Topical BID  . pantoprazole  40 mg Oral Daily  . sodium chloride flush  10-40 mL Intracatheter Q12H   albumin human, bacitracin, docusate, Gerhardt's butt cream, guaiFENesin-dextromethorphan, hydrOXYzine, levalbuterol, ondansetron (ZOFRAN) IV, oxyCODONE **OR** oxyCODONE, phenol, polyethylene glycol, Resource ThickenUp Clear, silver nitrate applicators, sodium chloride, sodium chloride flush, sodium chloride HYPERTONIC  Assessment/ Plan:  #1.End-stage renal disease from prolonged dialysis dependent acute kidney Injury:Suspected to be multifactorial acute kidney injury from septic shock/MRSA pneumonia and hemorrhagic shock at the outside. Has been on renal replacement therapy since 09/08/2020 and recently able to transition to intermittent hemodialysis from CRRT. Intermittent hemodialysis has been challenging due to hypotension and positional discomfort from decubitus ulcer.Creatinine remains misleadingly low and is indicative of significant lean muscle mass loss with prolonged hospitalization. She is not a candidate for CIR and family have no desire to have her admitted to SNF or LTACand intend to take her home upon discharge.Continue TTS dialysis while here. Last HD on 2/12 with 1000 cc UF.  Seems like urine output is picking up therefore plan to monitor labs next few days, watch for renal recovery.  I do not see any urgent indication for dialysis 12/14/2019.   2.Hypokalemia: Dialysis with high potassium bath, encourage oral intake and monitor lab.  .  No need to restrict potassium in her diet.  3.History of Covid pneumonia with ARDS: Status post tracheostomy and respiratory status appears to be stable with supplementation via T-bar.  4.Atrial fibrillation with rapid ventricular response: on metoprolol, tachycardic. On anticoagulation with Eliquis.  5.Severe protein calorie malnutrition with  large sacral decubitus ulcer: Ongoing nutritional supplementation with wound care.  6CKD-MBD: Calcium and phosphorus level acceptable.  Continue monitor.  7Anemia: Complicated by GI bleed from perforated duodenal ulcer status post exploratory laparotomy with Phillip Heal patch placement.   Received IV iron and continue ESA.  Monitor hemoglobin.    LOS: [place] _0 _1 :27 AM

## 2020-12-13 NOTE — Progress Notes (Signed)
[facility] PROGRESS NOTE  Candace [name] BVQ:[id number] DOB: 1951-10-22 DOA: 08/31/2020 PCP: Candace Greenhouse, MD            Status: Remains inpatient appropriate because:Ongoing diagnostic testing needed not appropriate for outpatient work up, Unsafe d/c plan, IV treatments appropriate due to intensity of illness or inability to take PO and Inpatient level of care appropriate due to severity of illness   Dispo: The patient is from: Home              Anticipated d/c is to: Home with home health               Anticipated d/c date is: > 3 days              Patient currently is not medically stable to d/c.  Barriers to discharge: Continues to require HD- unable to sit in chair/get in [facility] for OP HD-also requiring Cortrak for nocturnal feeds; still has trach with copiuous secretions at times  Considerations for home discharge: 1. Patient will need to continue wound VAC and current wound VAC is apparently not available in the home setting 2. It is likely she will need a specialty bed.  We are currently transitioning back to the [employer] rotational bed which will allow for better in bed mobility including the ability to "stand" with PT in the bed without actually getting patient out of bed 3. Likely would need to be decannulated to minimize other care issues after discharge 4. Would need to be able to either mobilize for outpatient hemodialysis or be a candidate for home dialysis 5. Would not have aggressive PT OT available in the home setting that she can achieve during hospitalization 6. Patient will have family members that are CNAs managing her after discharge but this would be 24/7 requirement and this could be an overwhelming issue once patient is actually home  Code Status: Full Family Communication: 2/11 daughter Candace [name] at bedside DVT prophylaxis: Eliquis Vaccination status: Has not been vaccinated but did have severe COVID infection initially diagnosed on 08/23/2020.  Patient will be  eligible for COVID-vaccine after 11/23/2020   Foley catheter: No, purewick female urinary collection device   HPI: 70 year old female patient with prior history of diabetes, stage III chronic kidney disease, atrial fibrillation, hypertension, dyslipidemia and colon cancer that is post colectomy and colostomy.  Initially diagnosed with COVID on 08/23/20 she was admitted to the hospital due to dehydration, acute kidney injury and hypoxemic respiratory failure.  She was discharged home on 2 to 3 L of oxygen after being treated with IV steroids and remdesivir.  During that time although she had elevated inflammatory markers she had no embolic or thrombotic disease.  She had been on prophylactic dose heparin during the hospitalization and was placed on Eliquis for 2 weeks for after discharge  Patient returned to the ER on 08/31/2020 for complaints of not feeling well.  She was found to be in atrial fibrillation with RVR, hemorrhagic shock and work-up revealed a perforated duodenal ulcer.  She underwent exploratory laparotomy on 11/3.  She has had an extensive ICU stay with significant events as below.   11/2 Admitted with rapid Afib 11/3 OR with findings of perforated duodenal ulcer 11/10 progressive hemorrhagic shock, intubated, transfused, pressors, proned; started on CRRT in PM 11/16 Extubated. Re-intubated overnight due to respiratory distress and hypoxia with decreased mentation 11/18 Bronch, cultures sent 11/19 Hgb down getting blood 11/24 Spiked fever, resume empirical antimicrobial therapy 11/26 Hemorrhagic shock, hgb  5.6, increased pressors,  11/30 Per palliative "[name] understanding that patient is unfortunately very fragiledespite ongoing intensive medical treatment and full mechanical support. [name] that the familywantsto continue with all current interventions despite potential outcomes". 12/08 CRRT discontinued due to clotting 12/09 Family requested transfer to  tertiary care [facility]). [facility] denied transfer  12/10 CRRT restarted. Episodes of tachycardia, tachypnea that seem to improve with pain management 12/11 Back in shock. Pressor requirements up. CXR worse. ABX resumed 12/12 Still requiring inc pressors. Had hypoglycemic event. Basal insulin dosing adjusted 12/13 Pressor requirements better. Now hyperglycemic. Re-adjusted Glycemic control  12/19 Afebrile . Remains on dilaudid and heparin gtt, dilaudid gtt increased overnight for concern of pain / ongoing tachycardia, no other events . NE and precedex off 12/17. Ongoing CRRT- even UF, dosing lokelmia this morning 12/20 On CRRT. Renal plans for HD cath and moving to HD. Getting wound care 12/24 - renal stopping CRRT today and plans HD 10/24/20 . 40% fio2 on vent via Trach. TAchypenic and tachycardic. Afebrile but wbc up to 27.6K. On TF. On heparin gtt 12/25 - Back on CRRT. On vent via trach at 40% fio2, On scheduled dilaudid as add on to oxy. Per family request 12/24 - they felt scheduled oxy was not adequate and patient was showing signs of opioid withdrawal.  Patient also had worsening SIRS/sepsis syndrome. Had fever, rising wbc, worsening RR 40-60 and HR 140s sinus->started On abx yesterday. Fever some better today. WBC plateau at 28,.5K. On new levophed ->signifanct improvement in HR 77 and RR t0 20. On heparin gtt. On precedex gtt. On levophed gtt 39mg wthi midodrine. On TF 12/30 Remains on CRRT with intermittent pressor requirements. Ethics consult placed 12/29. Ethics rec time trial of CRRT 12/31 failed SBT with RR 40s. Several conversations between care tam and family, who are upset RE plan of care 1/1 back on pressors  1/4: On pressors, keeping even on CVVHD, HGB drop to 6.9, transfused 1 unit. Improving mental status 1/10 remains on low dose levophed 255m 1/11 off levo, attempting HD with UF for first time. Now tolerating intermittent HD. 1/19 patient transferred to progressive bed with  tracheostomy on 35% FiO2.  has NG tube feeding.  No PEG tube.  Received dialysis on 1/18. 1/24 significant improvement and persistent tachycardia after introduction of twice daily beta-blocker.  Heart rates have improved from the 130s to the 98-104 range 1/24 core track clogged and therefore has been removed by nutrition team.  Calorie count in progress and we are weaning any sedating medications which could be contributing to patient's inability to eat. 1/27 continue w/ cuff #6 trach   Subjective: Awake and alert.  Verbal communication much more loud with increased clarity.  Smiling and interactive and while we are joking with her.  Daughter at bedside and I assisted her with bedside care.  Daughter asked that oral medications be changed to crushed and given through applesauce.   Objective: Vitals:   12/13/20 0313 12/13/20 0554  BP:  133/78  Pulse: 92 (!) 102  Resp:  20  Temp:  98.9 F (37.2 C)  SpO2:  100%    Intake/Output Summary (Last 24 hours) at 12/13/2020 0732 Last data filed at 12/12/2020 0946 Gross per 24 hour  Intake --  Output 500 ml  Net -500 ml   Filed Weights    Exam: Constitutional: Alert, calm, smiling Respiratory: Anterior lung sounds clear to auscultation FiO2 21%. #4 cuffed trach with PMV -cough secretions up to back of throat and  out of mouth Cardiovascular: The most part no resting tachycardia.  Occasionally does have some mild tachycardia usually in conjunction with pain.  No significant change in tachycardia after reduction in beta-blocker dose.  Pulses otherwise regular, extremities warm to touch with adequate capillary refill.  Heart sounds are normal Abdomen: On regular diet with nectar thick liquids- LBM 2/2-core track .  Right colostomy with stool noted in collection bag Skin:  Massive sacral decubitus ulcer-wound VAC-serosanguineous returns noted in [facility] collection system Neurologic: CN 2-12 grossly intact. Sensation intact, DTR normal. Strength 1/5 x all  4 extremities.  Psychiatric: Awake, oriented x3, willing and very interactive today   Assessment/Plan: Acute problems: PAF maintaining sinus rhythm w/persistent tachycardia -Given suboptimal blood pressure readings during dialysis have opted to decrease metoprolol to 37.5 mg BID and monitor for breakthrough worsening of tachycardia -Echocardiogram this admission with an EF 50 to 55% with mild diastolic dysfunction parameters and normal RV systolic function -Continue Eliquis-benefits coordinator has determined that Eliquis is covered at [facility] in [place] with a co-pay of $47  Acute respiratory failure secondary to COVID-pneumonia/tracheostomy/acute MSSA tracheobronchitis -Patient stable on low-flow oxygen FiO2 21% -PCCM/trach team following-change to #4 trach on 2/4 -Repeat respiratory culture on 1/24 with abundant staph with sensitivities pending -Completed IV Ancef for MSSA tracheobronchitis -Continue  suctioning and chest PT-Vibra vest -Continue PMV training  -Chest x-ray from 2/9 stated stable bibasilar atelectasis or infiltrates  Acute kidney injury secondary to COVID-related sepsis with shock superimposed on stage III chronic kidney disease -Continue intermittent hemodialysis as directed by the renal team -Avoid nephrotoxic medications -Continue Aranesp on dialysis days -Creatinine has normalized, patient making increased urine-at many times she leaks around pure wick and saturates padding multiple times per day-hopeful can discontinue dialysis as it appears renal function is recovering -BP improved therefore will change midodrine to use on dialysis days only 3 times daily  Lower extremity muscle pain -Continue Crea topically-attending MD added topical NSAIDs on 2/10 with improvement in pain.  Patient clarifies this is not a cramping intermittent pain but a constant pain. -Continue PROM and AROM -Family to bring in tennis shoes to wear in an alternating schedule to help improve foot  flexion noting this improves her leg pain  Anxiety -Vistaril prn added by attending on 2/10-added administration instruction to give at least 30 minutes prior to dialysis treatment at request of daughter  Dysphagia/moderate to severe protein calorie malnutrition Nutrition Status: Nutrition Problem: Increased nutrient needs Etiology: wound healing Signs/Symptoms: estimated needs Interventions: Refer to RD note for recommendations  Estimated body mass index is 31.04 kg/m as calculated from the following:   Height as of this encounter: 5' 8"  (1.727 m).   Weight as of this encounter: 92.6 kg.  -2/3: Continue regular diet with nectar thick liquids -2/7 at recommendation of nutrition team resumed nocturnal tube feedings -Continue Megace and Nepro shakes orally -2/9 follow-up SLP evaluation pending -2/14 appropriate meds changed to crush and administer with applesauce  Acute hemorrhagic shock secondary to perforated duodenal ulcer/anemia of critical illness (initial reason for admission) -Stable postoperatively -Hemoglobin remains stable greater than 8.0  Diabetes mellitus 2 -Patient has been transition to regular diet along with tube feedings and we have noted significant increase in CBG readings -Continue very sensitive SSI with meals -HgbA1c 08/24/2020 and was 7.0 -Prior to admission patient was on metformin XR-unable to use at this juncture due to severity of kidney disease -we are hopeful for recovery of renal function  Profound physical deconditioning/orthostasis/Myopathy of critical  illness -SNF recommended but family wants to take patient home -At this juncture patient unable to sit up in bed independently much less stand pivot and rotate to get into wheelchair and would be significant fall risk and care risk at home -In addition patient would need to be able to get in wheelchair to transport to dialysis if dialysis were to be continued in the outpatient setting -family refuses  LTAC or SNF rehab due to pt prior request to NEVER be placed in any type of facility -Continue high-dose midodrine 3 times daily on dialysis days only -Persistent tachycardia secondary to severe deconditioning has improved with the introduction of beta-blockers -Continue PROM every 4 hours-patient encouraged to perform independent AROM as well -2/3 order placed for repositioning every 2 hours -2/2: Successful out of bed to chair with initial short-term plan for 45 minutes to 1 hour today -2/11 continue therabands to siderails for UE resistance training   Stage IV sacral decubitus -Not present on admission -Wound care nurse following and utilizing Santyl and hydrotherapy and now has wound VAC in place -Surgical team consulted.  Current recommendation is against pursuing general anesthesia to undergo extensive surgical procedure-hopefully can be reevaluated in the future if wound does not heal adequately.  As of 1/28 they have nothing further acutely to add but will be following peripherally. [facility] rotational bed  -Continue low-dose oxycodone for pain patient complaining of inadequate pain control at sacral decubitus.  -Order placed for pain assessment every 2 hours with request to document each assessment in the progress notes  -  11/24/2020 after [facility]      11/22/2020                       11/29/2020     12/06/20   Incision (Closed) 09/01/20 Abdomen (Active)  Date First Assessed/Time First Assessed: 09/01/20 1737   Location: Abdomen    Assessments 09/01/2020  6:25 PM 12/12/2020  5:15 AM  Dressing Type Gauze (Comment) Gauze (Comment);Abdominal pads  Dressing -- Changed  Dressing Change Frequency -- Daily  Site / Wound Assessment Dressing in place / Unable to assess --  Margins -- Attached edges (approximated)  Closure -- None  Drainage Amount None Minimal  Drainage Description -- Serosanguineous  Treatment -- Cleansed     No Linked orders to display     Pressure Injury 09/17/20 Ear  Left;Anterior;Posterior Stage 2 -  Partial thickness loss of dermis presenting as a shallow open injury with a red, pink wound bed without slough. (Active)  Date First Assessed/Time First Assessed: 09/17/20 0800   Location: Ear  Location Orientation: Left;Anterior;Posterior  Staging: Stage 2 -  Partial thickness loss of dermis presenting as a shallow open injury with a red, pink wound bed without slough. ...    Assessments 09/17/2020  8:00 AM 12/05/2020  8:00 PM  Dressing Type Foam - Lift dressing to assess site every shift None  Dressing Clean;Dry;Intact --  Dressing Change Frequency Every 3 days --  Site / Wound Assessment Dry;Pink --  Peri-wound Assessment Intact --  Wound Length (cm) 2 cm --  Wound Width (cm) 1 cm --  Wound Depth (cm) 0.25 cm --  Wound Surface Area (cm^2) 2 cm^2 --  Wound Volume (cm^3) 0.5 cm^3 --  Margins Unattached edges (unapproximated) --  Drainage Amount Scant --  Drainage Description Serosanguineous --  Treatment Cleansed --     No Linked orders to display     Pressure Injury 09/25/20 Sacrum Bilateral;Medial  Deep Tissue Pressure Injury - Purple or maroon localized area of discolored intact skin or blood-filled blister due to damage of underlying soft tissue from pressure and/or shear. Purple, non-blanchable, b (Active)  Date First Assessed/Time First Assessed: 09/25/20 2000   Location: Sacrum  Location Orientation: Bilateral;Medial  Staging: Deep Tissue Pressure Injury - Purple or maroon localized area of discolored intact skin or blood-filled blister due to damage o...    Assessments 09/25/2020  5:00 PM 12/10/2020  2:54 PM  Dressing Type Foam - Lift dressing to assess site every shift Barrier Film (skin prep);Negative pressure wound therapy  Dressing Changed;Clean;Dry;Intact Changed  Dressing Change Frequency -- Monday, Wednesday, Friday  State of Healing -- Early/partial granulation  Site / Wound Assessment -- Pink;Yellow;Black  % Wound base Red or Granulating  -- 40%  % Wound base Yellow/Fibrinous Exudate -- 50%  % Wound base Black/Eschar -- 10%  Peri-wound Assessment -- Intact  Margins -- Unattached edges (unapproximated)  Drainage Amount -- Minimal  Drainage Description -- Sanguineous  Treatment -- Debridement (Selective);Negative pressure wound therapy     No Linked orders to display     Negative Pressure Wound Therapy Sacrum (Active)  Placement Date/Time: 11/22/20 1500   Wound Type: (c) Other (Comment)  Location: Sacrum    Assessments 11/22/2020  4:59 PM 12/12/2020  5:15 AM  Last dressing change 11/22/20 --  Site / Wound Assessment Granulation tissue;Pale;Yellow --  Peri-wound Assessment Intact --  Size see above --  Wound filler - Black foam 2 --  Wound filler - White foam 0 --  Wound filler - Nonadherent 0 --  Wound filler - Gauze 0 --  Cycle Continuous --  Target Pressure (mmHg) 125 --  Instillation Volume 26 mL --  Instillation Solution Normal Saline --  Instillation Soak Time 10 minutes --  Instillation Therapy Time 3.5 hours --  Canister Changed No --  Dressing Status Intact --  Drainage Amount None --  Output (mL) -- 50 mL     No Linked orders to display     Other problems: Coag neg staph-1/2 blood cultures positive -dw ID pharmacist- cultures c/w contamination Vancomycin ordered overnight stopped on 2/8  Acute encephalopathy secondary to prolonged hospital stay -Resolved  History of stage IV colon cancer -Old colostomy  Acute encephalopathy with ICU delirium -Delirium has resolved and supportive meds of Klonopin and Seroquel have been weaned and discontinued  Hypomagnesemia -Continue oral magnesium -1/21 give 4 g magnesium IV x1 -Follow labs  DVT bilateral posterior tibial veins -Was not present during initial COVID admission -Continue Eliquis for now given severe debility and increased risk for developing recurrent DVT  Hypertension -Having issues with orthostasis requiring midodrine Prior to  admission patient was on Norvasc  Dyslipidemia -Prior to admission patient was on Crestor-consider resuming soon once patient can swallow pills whole  Abnormal TSH -Nov 2021 TSH was 0.015-TSH and free T4, T3 normal this admission  Data Reviewed: Basic Metabolic Panel: Recent Labs  Lab 12/07/20 0550 12/08/20 0303 12/09/20 0903 12/11/20 0643 12/12/20 1100 12/13/20 0119  NA 136 136 137 140 131* 132*  K 3.4* 3.3* 3.1* 2.6* 4.2 4.5  CL 106 101 102 113* 99 99  CO2 19* 23 23 18* 20* 21*  GLUCOSE 147* 203* 163* 103* 220* 234*  BUN 46* 23 63* 66* 76* 90*  CREATININE 0.90 0.95 1.00 0.64 1.05* 1.00  CALCIUM 10.0 8.8* 10.7* 8.3* 10.2 10.0  MG  --   --   --  1.2*  --   --  PHOS 3.5 2.8 3.2  --  4.1 4.1   Liver Function Tests: Recent Labs  Lab 12/08/20 0303 12/09/20 0903 12/11/20 0643 12/12/20 1100 12/13/20 0119  AST  --   --  15  --   --   ALT  --   --  13  --   --   ALKPHOS  --   --  63  --   --   BILITOT  --   --  0.4  --   --   PROT  --   --  5.8*  --   --   ALBUMIN 2.2* 2.2* 1.8* 2.5* 2.4*   No results for input(s): LIPASE, AMYLASE in the last 168 hours. No results for input(s): AMMONIA in the last 168 hours. CBC: Recent Labs  Lab 12/07/20 0550 12/08/20 0303 12/09/20 0903 12/11/20 0643  WBC 17.2* 17.5* 16.5* 15.7*  NEUTROABS 12.1* 12.9*  --   --   HGB 7.4* 7.8* 8.1* 7.9*  HCT 25.2* 24.8* 26.8* 25.1*  MCV 86.9 86.1 87.9 85.7  PLT 395 393 443* 283   Cardiac Enzymes: No results for input(s): CKTOTAL, CKMB, CKMBINDEX, TROPONINI in the last 168 hours. BNP (last 3 results) Recent Labs    09/07/20 0118 09/08/20 0446 09/09/20 0428  BNP 216.8* 432.5* 609.7*    ProBNP (last 3 results) No results for input(s): PROBNP in the last 8760 hours.  CBG: Recent Labs  Lab 12/11/20 2018 12/12/20 0843 12/12/20 1201 12/12/20 1724 12/12/20 2113  GLUCAP 198* 116* 159* 178* 253*    Recent Results (from the past 240 hour(s))  Culture, blood (routine x 2)     Status:  Abnormal   Collection Time: 12/05/20 11:07 AM   Specimen: BLOOD RIGHT ARM  Result Value Ref Range Status   Specimen Description BLOOD RIGHT ARM  Final   Special Requests   Final    BOTTLES DRAWN AEROBIC ONLY Blood Culture adequate volume   Culture  Setup Time   Final    GRAM POSITIVE COCCI IN CLUSTERS AEROBIC BOTTLE ONLY CRITICAL RESULT CALLED TO, READ BACK BY AND VERIFIED WITH: Tillman Sers PHARMD 2000 12/06/20 A BROWNING    Culture (A)  Final    STAPHYLOCOCCUS EPIDERMIDIS THE SIGNIFICANCE OF ISOLATING THIS ORGANISM FROM A SINGLE SET OF BLOOD CULTURES WHEN MULTIPLE SETS ARE DRAWN IS UNCERTAIN. PLEASE NOTIFY THE MICROBIOLOGY DEPARTMENT WITHIN ONE WEEK IF SPECIATION AND SENSITIVITIES ARE REQUIRED. Performed at [facility], [place] [address], [place], [place] [address]    Report Status 12/08/2020 FINAL  Final  Culture, blood (routine x 2)     Status: None   Collection Time: 12/05/20 11:07 AM   Specimen: BLOOD LEFT HAND  Result Value Ref Range Status   Specimen Description BLOOD LEFT HAND  Final   Special Requests   Final    BOTTLES DRAWN AEROBIC ONLY Blood Culture adequate volume   Culture   Final    NO GROWTH 5 DAYS Performed at [facility], [place] [address], [place], [place] [address]    Report Status 12/10/2020 FINAL  Final  Blood Culture ID Panel (Reflexed)     Status: Abnormal   Collection Time: 12/05/20 11:07 AM  Result Value Ref Range Status   Enterococcus faecalis NOT DETECTED NOT DETECTED Final   Enterococcus Faecium NOT DETECTED NOT DETECTED Final   Listeria monocytogenes NOT DETECTED NOT DETECTED Final   Staphylococcus species DETECTED (A) NOT DETECTED Final    Comment: CRITICAL RESULT CALLED TO, READ  BACK BY AND VERIFIED WITH: Tillman Sers PHARMD 2000 12/06/20 A BROWNING    Staphylococcus aureus (BCID) NOT DETECTED NOT DETECTED Final   Staphylococcus epidermidis DETECTED (A) NOT DETECTED Final    Comment: Methicillin (oxacillin) resistant coagulase negative  staphylococcus. Possible blood culture contaminant (unless isolated from more than one blood culture draw or clinical case suggests pathogenicity). No antibiotic treatment is indicated for blood  culture contaminants. CRITICAL RESULT CALLED TO, READ BACK BY AND VERIFIED WITH: Tillman Sers PHARMD 2000 12/06/20 A BROWNING    Staphylococcus lugdunensis NOT DETECTED NOT DETECTED Final   Streptococcus species NOT DETECTED NOT DETECTED Final   Streptococcus agalactiae NOT DETECTED NOT DETECTED Final   Streptococcus pneumoniae NOT DETECTED NOT DETECTED Final   Streptococcus pyogenes NOT DETECTED NOT DETECTED Final   A.calcoaceticus-baumannii NOT DETECTED NOT DETECTED Final   Bacteroides fragilis NOT DETECTED NOT DETECTED Final   Enterobacterales NOT DETECTED NOT DETECTED Final   Enterobacter cloacae complex NOT DETECTED NOT DETECTED Final   Escherichia coli NOT DETECTED NOT DETECTED Final   Klebsiella aerogenes NOT DETECTED NOT DETECTED Final   Klebsiella oxytoca NOT DETECTED NOT DETECTED Final   Klebsiella pneumoniae NOT DETECTED NOT DETECTED Final   Proteus species NOT DETECTED NOT DETECTED Final   Salmonella species NOT DETECTED NOT DETECTED Final   Serratia marcescens NOT DETECTED NOT DETECTED Final   Haemophilus influenzae NOT DETECTED NOT DETECTED Final   Neisseria meningitidis NOT DETECTED NOT DETECTED Final   Pseudomonas aeruginosa NOT DETECTED NOT DETECTED Final   Stenotrophomonas maltophilia NOT DETECTED NOT DETECTED Final   Candida albicans NOT DETECTED NOT DETECTED Final   Candida auris NOT DETECTED NOT DETECTED Final   Candida glabrata NOT DETECTED NOT DETECTED Final   Candida krusei NOT DETECTED NOT DETECTED Final   Candida parapsilosis NOT DETECTED NOT DETECTED Final   Candida tropicalis NOT DETECTED NOT DETECTED Final   Cryptococcus neoformans/gattii NOT DETECTED NOT DETECTED Final   Methicillin resistance mecA/C DETECTED (A) NOT DETECTED Final    Comment: CRITICAL RESULT CALLED  TO, READ BACK BY AND VERIFIED WITH: Tillman Sers PHARMD 2000 12/06/20 A BROWNING Performed at [facility], [address] [place], [place] [address]      Studies: No results found.  Scheduled Meds: . acetaminophen (TYLENOL) oral liquid 160 mg/5 mL  650 mg Oral Q6H  . apixaban  2.5 mg Oral BID  . carbamide peroxide  10 drop Both EARS BID  . chlorhexidine gluconate (MEDLINE KIT)  15 mL Mouth Rinse BID  . Chlorhexidine Gluconate Cloth  6 each Topical Q0600  . cholecalciferol  2,000 Units Oral Daily  . collagenase   Topical BID  . darbepoetin (ARANESP) injection - DIALYSIS  100 mcg Intravenous Q Sat-HD  . diclofenac Sodium  2 g Topical QID  . feeding supplement (NEPRO CARB STEADY)  237 mL Oral TID BM  . feeding supplement (NEPRO CARB STEADY)  780 mL Per Tube Q24H  . feeding supplement (PROSource TF)  45 mL Per Tube TID  . guaiFENesin  15 mL Oral Q12H  . insulin aspart  0-6 Units Subcutaneous TID WC  . mouth rinse  15 mL Mouth Rinse QID  . megestrol  200 mg Oral QID  . metoprolol tartrate  37.5 mg Oral BID  . midodrine  40 mg Oral 3 times per day on Tue Thu Sat  . multivitamin  1 tablet Oral QHS  . nutrition supplement (JUVEN)  1 packet Per Tube BID BM  . nystatin  Topical BID  . pantoprazole  40 mg Oral Daily  . sodium chloride flush  10-40 mL Intracatheter Q12H   Continuous Infusions: . albumin human 25 g (11/16/20 1516)    Principal Problem:   Atrial fibrillation with RVR ([place]) Active Problems:   Acute respiratory failure due to COVID-19 [facility])   New onset a-fib (HCC)   Leukocytosis   Atrial fibrillation with rapid ventricular response (HCC)   Hypoxia   Pressure injury of skin   Acute hypoxemic respiratory failure (HCC)   ARDS (adult respiratory distress syndrome) (HCC)   Perforated duodenal ulcer ([place])   On mechanically assisted ventilation ([place])   Palliative care by specialist   Goals of care, counseling/discussion   Shock ([place])   Acute and chronic respiratory  failure (acute-on-chronic) ([place])   Status post tracheostomy ([place])   ESRD (end stage renal disease) ([place])   HAP (hospital-acquired pneumonia)   Consultants:  Cardiology  Surgery  Nephrology  Ethics  PCCM   Procedures: R PICC 11/5 >> A line 11/9 >> out ETT 11/9 > 11/16, 11/16 >> 09/21/2020, 09/21/2020 tracheostomy>> Lt [place] CVL 11/9 >> R IJ trialysis >> out HD catheter 12/1 >>12/20 12/21 - 14.5 Fr, 23 cm right IJ tunneled hemodialysis catheter placement. Removal of indwelling subclavian catheter.   Antibiotics: Anti-infectives (From admission, onward)   Start     Dose/Rate Route Frequency Ordered Stop   12/07/20 1200  vancomycin (VANCOCIN) IVPB 1000 mg/200 mL premix  Status:  Discontinued        1,000 mg 200 mL/hr over 60 Minutes Intravenous Every T-Th-Sa (Hemodialysis) 12/06/20 2156 12/07/20 1113   12/06/20 2245  vancomycin (VANCOREADY) IVPB 2000 mg/400 mL        2,000 mg 200 mL/hr over 120 Minutes Intravenous  Once 12/06/20 2156 12/07/20 0211   11/27/20 2000  ceFAZolin (ANCEF) IVPB 1 g/50 mL premix        1 g 100 mL/hr over 30 Minutes Intravenous Every 24 hours 11/26/20 1248 12/03/20 2136   11/26/20 1345  ceFAZolin (ANCEF) IVPB 1 g/50 mL premix        1 g 100 mL/hr over 30 Minutes Intravenous  Once 11/26/20 1248 11/26/20 1433   11/17/20 1415  fluconazole (DIFLUCAN) 40 MG/ML suspension 200 mg        200 mg Oral  Once 11/17/20 1315 11/17/20 1357   11/03/20 2200  meropenem (MERREM) 500 mg in sodium chloride 0.9 % 100 mL IVPB        500 mg 200 mL/hr over 30 Minutes Intravenous Every 24 hours 11/03/20 1500 11/06/20 2217   11/01/20 1000  anidulafungin (ERAXIS) 100 mg in sodium chloride 0.9 % 100 mL IVPB  Status:  Discontinued       "Followed by" Linked Group Details   100 mg 78 mL/hr over 100 Minutes Intravenous Every 24 hours 10/31/20 0916 11/01/20 1404   10/31/20 1015  meropenem (MERREM) 1 g in sodium chloride 0.9 % 100 mL IVPB  Status:  Discontinued        1 g 200  mL/hr over 30 Minutes Intravenous Every 8 hours 10/31/20 0916 11/03/20 1500   10/31/20 1015  linezolid (ZYVOX) IVPB 600 mg  Status:  Discontinued        600 mg 300 mL/hr over 60 Minutes Intravenous Every 12 hours 10/31/20 0916 11/02/20 0906   10/31/20 1015  anidulafungin (ERAXIS) 200 mg in sodium chloride 0.9 % 200 mL IVPB       "Followed by" Linked Group Details  200 mg 78 mL/hr over 200 Minutes Intravenous  Once 10/31/20 0916 10/31/20 1630   10/25/20 1800  ceFAZolin (ANCEF) IVPB 2g/100 mL premix  Status:  Discontinued        2 g 200 mL/hr over 30 Minutes Intravenous Every 12 hours 10/25/20 1022 10/30/20 1103   10/23/20 2000  vancomycin (VANCOREADY) IVPB 750 mg/150 mL  Status:  Discontinued        750 mg 150 mL/hr over 60 Minutes Intravenous Every 24 hours 10/22/20 2036 10/25/20 1022   10/23/20 1800  piperacillin-tazobactam (ZOSYN) IVPB 3.375 g  Status:  Discontinued        3.375 g 100 mL/hr over 30 Minutes Intravenous Every 6 hours 10/23/20 1155 10/24/20 1422   10/23/20 0200  piperacillin-tazobactam (ZOSYN) IVPB 3.375 g  Status:  Discontinued        3.375 g 100 mL/hr over 30 Minutes Intravenous Every 8 hours 10/22/20 2036 10/23/20 1155   10/22/20 1630  vancomycin (VANCOREADY) IVPB 1500 mg/300 mL        1,500 mg 150 mL/hr over 120 Minutes Intravenous  Once 10/22/20 1537 10/22/20 2229   10/22/20 1630  piperacillin-tazobactam (ZOSYN) IVPB 2.25 g  Status:  Discontinued        2.25 g 100 mL/hr over 30 Minutes Intravenous Every 8 hours 10/22/20 1537 10/22/20 2036   10/22/20 1537  vancomycin variable dose per unstable renal function (pharmacist dosing)  Status:  Discontinued         Does not apply See admin instructions 10/22/20 1537 10/22/20 2036   10/19/20 0600  ceFAZolin (ANCEF) IVPB 2g/100 mL premix        2 g 200 mL/hr over 30 Minutes Intravenous To Radiology 10/18/20 1457 10/19/20 0938   10/10/20 0830  vancomycin (VANCOCIN) IVPB 1000 mg/200 mL premix       "Followed by" Linked Group  Details   1,000 mg 200 mL/hr over 60 Minutes Intravenous Every 24 hours 10/09/20 0744 10/15/20 0902   10/09/20 0830  piperacillin-tazobactam (ZOSYN) IVPB 3.375 g        3.375 g 100 mL/hr over 30 Minutes Intravenous Every 6 hours 10/09/20 0744 10/16/20 0038   10/09/20 0830  vancomycin (VANCOREADY) IVPB 2000 mg/400 mL       "Followed by" Linked Group Details   2,000 mg 200 mL/hr over 120 Minutes Intravenous  Once 10/09/20 0744 10/09/20 1022   09/24/20 1000  ceFAZolin (ANCEF) IVPB 2g/100 mL premix  Status:  Discontinued        2 g 200 mL/hr over 30 Minutes Intravenous Every 12 hours 09/24/20 0801 09/24/20 1046   09/23/20 1400  vancomycin (VANCOCIN) IVPB 1000 mg/200 mL premix        1,000 mg 200 mL/hr over 60 Minutes Intravenous Every 24 hours 09/22/20 1436 09/28/20 1718   09/22/20 2200  ceFEPIme (MAXIPIME) 2 g in sodium chloride 0.9 % 100 mL IVPB  Status:  Discontinued        2 g 200 mL/hr over 30 Minutes Intravenous Every 12 hours 09/22/20 1436 09/24/20 0801   09/22/20 1030  ceFEPIme (MAXIPIME) 1 g in sodium chloride 0.9 % 100 mL IVPB        1 g 200 mL/hr over 30 Minutes Intravenous  Once 09/22/20 0934 09/22/20 1145   09/22/20 1015  vancomycin (VANCOCIN) IVPB 1000 mg/200 mL premix        1,000 mg 200 mL/hr over 60 Minutes Intravenous  Once 09/22/20 0934 09/22/20 1446   09/12/20 2200  ceFEPIme (MAXIPIME) 2  g in sodium chloride 0.9 % 100 mL IVPB        2 g 200 mL/hr over 30 Minutes Intravenous Every 12 hours 09/12/20 0732 09/14/20 2134   09/11/20 1400  ceFEPIme (MAXIPIME) 2 g in sodium chloride 0.9 % 100 mL IVPB  Status:  Discontinued        2 g 200 mL/hr over 30 Minutes Intravenous Every 8 hours 09/11/20 1126 09/12/20 0732   09/08/20 1000  vancomycin (VANCOREADY) IVPB 2000 mg/400 mL        2,000 mg 200 mL/hr over 120 Minutes Intravenous  Once 09/08/20 0857 09/08/20 1224   09/08/20 1000  ceFEPIme (MAXIPIME) 2 g in sodium chloride 0.9 % 100 mL IVPB  Status:  Discontinued        2 g 200  mL/hr over 30 Minutes Intravenous Every 12 hours 09/08/20 0857 09/11/20 1126   09/08/20 0856  vancomycin variable dose per unstable renal function (pharmacist dosing)  Status:  Discontinued         Does not apply See admin instructions 09/08/20 0857 09/09/20 0935   09/02/20 1600  cefTRIAXone (ROCEPHIN) 1 g in sodium chloride 0.9 % 100 mL IVPB  Status:  Discontinued        1 g 200 mL/hr over 30 Minutes Intravenous Every 24 hours 09/01/20 1811 09/02/20 0838   09/01/20 1800  fluconazole (DIFLUCAN) IVPB 400 mg        400 mg 50 mL/hr over 240 Minutes Intravenous  Once 09/01/20 1749 09/02/20 0603   09/01/20 1530  piperacillin-tazobactam (ZOSYN) IVPB 3.375 g        3.375 g 12.5 mL/hr over 240 Minutes Intravenous Every 8 hours 09/01/20 1514 09/05/20 2111   09/01/20 1000  levofloxacin (LEVAQUIN) tablet 250 mg  Status:  Discontinued        250 mg Oral Daily 08/31/20 1508 08/31/20 1735   08/31/20 1730  cefTRIAXone (ROCEPHIN) 1 g in sodium chloride 0.9 % 100 mL IVPB  Status:  Discontinued        1 g 200 mL/hr over 30 Minutes Intravenous Every 24 hours 08/31/20 1726 09/01/20 1513   08/31/20 1730  azithromycin (ZITHROMAX) 500 mg in sodium chloride 0.9 % 250 mL IVPB  Status:  Discontinued        500 mg 250 mL/hr over 60 Minutes Intravenous Every 24 hours 08/31/20 1726 09/02/20 0838       Time spent: 40 minutes    [name] ANP  [facility] 7 am - 330 pm/M-F for direct patient care and secure chat Please refer to Amion for contact info 103  days

## 2020-12-13 NOTE — Consult Note (Signed)
[place] Nurse wound follow up Patient receiving care in [place]. Receiving PT hydrotherapy and VAC to sacral wound. Record reviewed. Improvement is being made to the sacral wound with hydrotherapy and the [place] VAC. Continue current POC. Val Riles, RN, MSN, CWOCN, CNS-BC, pager [phone]

## 2020-12-13 NOTE — Progress Notes (Signed)
Occupational Therapy Treatment Patient Details Name: Candace [name] MRN: [id number] DOB: 1951-01-10 Today's Date: 12/13/2020    History of present illness Pt 71 y.o. female with medical history significant for recent covid pna, obesity, colon cancer s/p colon resection with colostomy bag, HLD, NIDDM2, CKD3 presented to ED after her follow-up nurse advised her to present to ED for elevated HR. +afib, elevated troponins with demand ischemia,  CT scan is positive for bowel perforation with pneumomediastinum 11/03 exp lap with repair of perforated ulcer. 11/10 t/f to ICU- intubated/sedated/proned. Trach placed 11/24. CRRT off 12/8, restarted 12/10-1/5. 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   OT comments  Pt seen in conjunction with PT to maximize therapeutic benefits for OOB attempts and caregiver education. On entry, pt had just received lunch tray. Attempted to engage pt in OOB activities to be able to eat lunch sitting up. However, pt declined OOB/EOB attempts on this date despite education/introduction on trial of Stedy to progress standing. Provided additional HEP handouts for fine motor coordination, UE strength via theraband on B hand rails as well as built up grips. Pt's daughter reports pt has been able to demonstrate instances of writing/coloring with regular pen. Plan to progress strength, ADLs and OOB tolerance as appropriate.    Follow Up Recommendations  LTACH;SNF;Supervision/Assistance - 24 hour;Other (comment) (Max HH services if pt/family plan is to discharge home instead)    Financial risk analyst (measurements OT);Wheelchair cushion (measurements OT);Hospital bed;Other (comment) (tilt in space power chair, air mattress and air cushion for wheelchair; hoyer lift)    Recommendations for Other Services      Precautions / Restrictions Precautions Precautions: Fall Precaution  Comments: baseline R colostomy; sacral wound with wound vac, trach capped Required Braces or Orthoses: Other Brace Other Brace: B prafos Restrictions Weight Bearing Restrictions: No       Mobility Bed Mobility               General bed mobility comments: declined to attempt EOB  Transfers                 General transfer comment: declined    Balance                                           ADL either performed or assessed with clinical judgement   ADL Overall ADL's : Needs assistance/impaired Eating/Feeding: Minimal assistance;Bed level Eating/Feeding Details (indicate cue type and reason): Pt able to bring sandwich to mouth, daughter assists with self feeding as needed                                   General ADL Comments: daughter reports pt able to color/write with regular pen today though continues with some difficulty. Provided additional yellow theraband to place on second bedrail and red built up grips. Provided UE HEP for theraband exercises bed level, fine motor coordination, AROM of hand/wrist/elbow and putty exercises. Would benefit from HEP for PROM of shoulders for caregiver.     Vision   Vision Assessment?: No apparent visual deficits   Perception     Praxis      Cognition Arousal/Alertness: Awake/alert Behavior During Therapy: Flat affect Overall Cognitive Status: Impaired/Different from baseline Area  of Impairment: Following commands;Problem solving                       Following Commands: Follows one step commands with increased time     Problem Solving: Slow processing General Comments: Flat affect today, slow responses        Exercises     Shoulder Instructions       General Comments Daughter present and supportive. Educated on trial of Stedy but pt declined to attempt today. Attempted EOB activity encouragement but pt declined and reported preference to eat lunch    Pertinent  Vitals/ Pain       Pain Assessment: Faces Faces Pain Scale: Hurts little more Pain Location: bottom Pain Descriptors / Indicators: Discomfort Pain Intervention(s): Repositioned;Monitored during session;Premedicated before session;Other (comment) (daughter repositioned HOB)  Home Living                                          Prior Functioning/Environment              Frequency  Min 2X/week        Progress Toward Goals  OT Goals(current goals can now be found in the care plan section)  Progress towards OT goals: Progressing toward goals  Acute Rehab OT Goals Patient Stated Goal: eat her lunch OT Goal Formulation: With patient/family Time For Goal Achievement: 12/20/20 Potential to Achieve Goals: Fair ADL Goals Pt Will Perform Grooming: with mod assist;bed level;sitting Pt Will Perform Upper Body Bathing: with max assist;sitting;bed level Pt Will Perform Upper Body Dressing: with set-up;with supervision;sitting Pt Will Transfer to Toilet: with max assist;with +2 assist;squat pivot transfer;bedside commode Pt Will Perform Toileting - Clothing Manipulation and hygiene: with min guard assist;sitting/lateral leans;sit to/from stand Pt Will Perform Tub/Shower Transfer: with supervision;ambulating;shower seat;rolling walker Pt/caregiver will Perform Home Exercise Program: Increased strength;Both right and left upper extremity;With written HEP provided;With theraband Additional ADL Goal #1: Pt will sustain attention to ADL task with min VCs Additional ADL Goal #2: Pt will sit EOB for 5 mins with mod A for trunk support with VSS Additional ADL Goal #3: Pt will complete bed mobility with mod A as precursor to ADLs  Plan Discharge plan remains appropriate;Frequency remains appropriate    Co-evaluation    PT/OT/SLP Co-Evaluation/Treatment: Yes Reason for Co-Treatment: Complexity of the patient's impairments (multi-system involvement);For patient/therapist  safety;To address functional/ADL transfers   OT goals addressed during session: ADL's and self-care;Strengthening/ROM      AM-PAC OT "6 Clicks" Daily Activity     Outcome Measure   Help from another person eating meals?: A Little Help from another person taking care of personal grooming?: A Little Help from another person toileting, which includes using toliet, bedpan, or urinal?: Total Help from another person bathing (including washing, rinsing, drying)?: A Lot Help from another person to put on and taking off regular upper body clothing?: A Lot Help from another person to put on and taking off regular lower body clothing?: Total 6 Click Score: 12    End of Session    OT Visit Diagnosis: Unsteadiness on feet (R26.81);Other abnormalities of gait and mobility (R26.89);Muscle weakness (generalized) (M62.81);Other symptoms and signs involving cognitive function   Activity Tolerance Patient limited by pain   Patient Left in bed;with call bell/phone within reach;with family/visitor present   Nurse Communication Mobility status  Time: 2641-5830 OT Time Calculation (min): 23 min  Charges: OT General Charges $OT Visit: 1 Visit OT Treatments $Therapeutic Activity: 8-22 mins  [name], OTR/L   [name] 12/13/2020, 2:32 PM

## 2020-12-13 NOTE — Progress Notes (Signed)
Renal Navigator following along as patient makes progress.   Alphonzo Cruise, [place] Renal Navigator [phone]

## 2020-12-14 DIAGNOSIS — I4891 Unspecified atrial fibrillation: Secondary | ICD-10-CM | POA: Diagnosis not present

## 2020-12-14 LAB — RENAL FUNCTION PANEL
Albumin: 2.6 g/dL — ABNORMAL LOW (ref 3.5–5.0)
Anion gap: 10 (ref 5–15)
BUN: 79 mg/dL — ABNORMAL HIGH (ref 8–23)
CO2: 17 mmol/L — ABNORMAL LOW (ref 22–32)
Calcium: 10.7 mg/dL — ABNORMAL HIGH (ref 8.9–10.3)
Chloride: 106 mmol/L (ref 98–111)
Creatinine, Ser: 0.86 mg/dL (ref 0.44–1.00)
GFR, Estimated: >60 mL/min (ref >60)
Glucose, Bld: 281 mg/dL — ABNORMAL HIGH (ref 70–99)
Phosphorus: 3.1 mg/dL (ref 2.5–4.6)
Potassium: 4.3 mmol/L (ref 3.5–5.1)
Sodium: 133 mmol/L — ABNORMAL LOW (ref 135–145)

## 2020-12-14 LAB — GLUCOSE, CAPILLARY
Glucose-Capillary: 161 mg/dL — ABNORMAL HIGH (ref 70–99)
Glucose-Capillary: 172 mg/dL — ABNORMAL HIGH (ref 70–99)
Glucose-Capillary: 178 mg/dL — ABNORMAL HIGH (ref 70–99)
Glucose-Capillary: 246 mg/dL — ABNORMAL HIGH (ref 70–99)
Glucose-Capillary: 264 mg/dL — ABNORMAL HIGH (ref 70–99)
Glucose-Capillary: 316 mg/dL — ABNORMAL HIGH (ref 70–99)
Glucose-Capillary: 329 mg/dL — ABNORMAL HIGH (ref 70–99)

## 2020-12-14 MED ORDER — INSULIN ASPART 100 UNIT/ML ~~LOC~~ SOLN
3.0000 [IU] | Freq: Once | SUBCUTANEOUS | Status: AC
  Administered 2020-12-14: 3 [IU] via SUBCUTANEOUS

## 2020-12-14 MED ORDER — INSULIN ASPART 100 UNIT/ML ~~LOC~~ SOLN
0.0000 [IU] | Freq: Every day | SUBCUTANEOUS | Status: DC
  Administered 2020-12-14: 2 [IU] via SUBCUTANEOUS
  Administered 2020-12-15 – 2020-12-16 (×2): 3 [IU] via SUBCUTANEOUS
  Administered 2020-12-17 – 2020-12-22 (×4): 2 [IU] via SUBCUTANEOUS

## 2020-12-14 MED ORDER — INSULIN DETEMIR 100 UNIT/ML ~~LOC~~ SOLN
5.0000 [IU] | Freq: Two times a day (BID) | SUBCUTANEOUS | Status: DC
  Administered 2020-12-14 – 2020-12-17 (×8): 5 [IU] via SUBCUTANEOUS
  Filled 2020-12-14 (×10): qty 0.05

## 2020-12-14 MED ORDER — INSULIN ASPART 100 UNIT/ML ~~LOC~~ SOLN
0.0000 [IU] | Freq: Three times a day (TID) | SUBCUTANEOUS | Status: DC
  Administered 2020-12-14 – 2020-12-15 (×2): 1 [IU] via SUBCUTANEOUS
  Administered 2020-12-15 – 2020-12-16 (×4): 2 [IU] via SUBCUTANEOUS
  Administered 2020-12-17: 3 [IU] via SUBCUTANEOUS
  Administered 2020-12-17: 2 [IU] via SUBCUTANEOUS
  Administered 2020-12-17: 3 [IU] via SUBCUTANEOUS

## 2020-12-14 NOTE — Progress Notes (Signed)
Came by to see patient and daughter.  Urine output seems to be increasing, which is re-assuring. HR much improved with less episodes of tachycardia.  Renal consult appreciated.  PCCM following for trach.  PT doing hydrotherapy.  Plan for d/c home with family once stable.   Eulogio Bear DO

## 2020-12-14 NOTE — Progress Notes (Signed)
Nutrition Follow-up  DOCUMENTATION CODES:   Not applicable  INTERVENTION:   Recommend long term feeding tube given prolonged poor intake and wound status.    ContinueNepro Shake poTID, each supplement provides 425 kcal and 19 grams protein  ContinueMagic cup TID with meals, each supplement provides 290 kcal and 9 grams of protein  Continue liberalized diet  Double portion of protein at meals  Continue appetite stimulant (currently on Megace)  Continue1 packet Juven BIDvia cortrak tube   Continue nocturnal tube feeding: -Nepro @ 70 ml/hr x 8 hours via cortrak tube (2200-0600) -45 ml Prosource TF TID  Regimen provides 1128 kcals, 78 grams protein, and 407 ml free water daily, meeting 56% of estimated kcal needs and 78% of estimated protein needs.   NUTRITION DIAGNOSIS:   Increased nutrient needs related to wound healing as evidenced by estimated needs.  Ongoing  GOAL:   Patient will meet greater than or equal to 90% of their needs  Progressing   MONITOR:   PO intake,Supplement acceptance,Labs,Weight trends,TF tolerance,Skin,I & O's  REASON FOR ASSESSMENT:   Consult New TPN/TNA  ASSESSMENT:   70 year old female patient with h/o diabetes, stage III chronic kidney disease, atrial fibrillation, hypertension, dyslipidemia and colon cancer s/p colectomy and colostomy who was admitted with COVID on 08/23/20 along with acute kidney injury and hypoxemic respiratory failure.  Pt was discharged home on 2 to 3 L of oxygen but returned to the ER on 08/31/2020 with atrial fibrillation with RVR and hemorrhagic shock r/t perforated duodenal ulcer now s/p exploratory laparotomy on 11/3 with adhesiolysis and modified graham patch.   Last HD on 2/12, UOP improved, Cr improved. No urgent need for HD per nephrology. Plan trach decannulation today or tomorrow.   Intake continues to progress slowly. Liquids advanced to thins on 2/11. Daughter reports patient finishes around 10-25%  of her meals. She is able to take an entire Nepro shake and one [employer] daily. Daughter reports patient chews meats provided at meals and spits them out. She has been doing this for months. Patient denies issues with swallowing meats and states they just wont go down. RD discussed other protein options patient could have to promote wound healing. Reviewed entire hospital menu and circled each protein item to help them when it's time to order meals.   Discussed with daughter that intake will need to progress further prior to Cortrak being discontinued. Though patient making improvements in PO intake, she would still benefit from long term feeding tube given poor intake over the last few months and wound status.   UOP: 1600 ml x 24 hrs VAC (sacrum): 375 ml x 24 hrs Colostomy: 50 ml x 24 hrs   Medications: aranesp, SS novolog, levemir, megace QID Labs: Na 133 (L) CBG 153-316  Diet Order:   Diet Order            Diet regular Room service appropriate? Yes with Assist; Fluid consistency: Thin  Diet effective now                 EDUCATION NEEDS:   Not appropriate for education at this time  Skin:  Skin Assessment: Skin Integrity Issues: Skin Integrity Issues:: Stage II DTI: sacrum Stage I: n/a Stage II: ear Incisions: abdomen  Last BM:  2/15  Height:   Ht Readings from Last 1 Encounters:  09/20/20 5\' 8"  (1.727 m)    Weight:   Wt Readings from Last 1 Encounters:  08/23/20 99.8 kg   BMI:  Body  mass index is 31.04 kg/m.  Estimated Nutritional Needs:   Kcal:  2000-2300kcal/day  Protein:  100-120g/day  Fluid:  UOP +1L  Mariana Single RD, LDN Clinical Nutrition Pager listed in [place]

## 2020-12-14 NOTE — Progress Notes (Signed)
[facility] PROGRESS NOTE  Candace [name] LFY:[id number] DOB: 07-22-1951 DOA: 08/31/2020 PCP: Algis Greenhouse, MD            Status: Remains inpatient appropriate because:Ongoing diagnostic testing needed not appropriate for outpatient work up, Unsafe d/c plan, IV treatments appropriate due to intensity of illness or inability to take PO and Inpatient level of care appropriate due to severity of illness   Dispo: The patient is from: Home              Anticipated d/c is to: Home with home health               Anticipated d/c date is: > 3 days              Patient currently is not medically stable to d/c.  Barriers to discharge: Continues to require HD- unable to sit in chair/get in [facility] for OP HD-also requiring Cortrak for nocturnal feeds; still has trach with copiuous secretions at times  Considerations for home discharge: 1. Patient will need to continue wound VAC and current wound VAC is apparently not available in the home setting 2. It is likely she will need a specialty bed.  We are currently transitioning back to the [place] rotational bed which will allow for better in bed mobility including the ability to "stand" with PT in the bed without actually getting patient out of bed 3. Likely would need to be decannulated to minimize other care issues after discharge 4. Would need to be able to either mobilize for outpatient hemodialysis or be a candidate for home dialysis 5. Would not have aggressive PT OT available in the home setting that she can achieve during hospitalization 6. Patient will have family members that are CNAs managing her after discharge but this would be 24/7 requirement and this could be an overwhelming issue once patient is actually home  Code Status: Full Family Communication: 2/15 daughter Candace [name] at bedside DVT prophylaxis: Eliquis Vaccination status: Has not been vaccinated but did have severe COVID infection initially diagnosed on 08/23/2020.  Patient will be  eligible for COVID-vaccine after 11/23/2020   Foley catheter: No, purewick female urinary collection device   HPI: 70 year old female patient with prior history of diabetes, stage III chronic kidney disease, atrial fibrillation, hypertension, dyslipidemia and colon cancer that is post colectomy and colostomy.  Initially diagnosed with COVID on 08/23/20 she was admitted to the hospital due to dehydration, acute kidney injury and hypoxemic respiratory failure.  She was discharged home on 2 to 3 L of oxygen after being treated with IV steroids and remdesivir.  During that time although she had elevated inflammatory markers she had no embolic or thrombotic disease.  She had been on prophylactic dose heparin during the hospitalization and was placed on Eliquis for 2 weeks for after discharge  Patient returned to the ER on 08/31/2020 for complaints of not feeling well.  She was found to be in atrial fibrillation with RVR, hemorrhagic shock and work-up revealed a perforated duodenal ulcer.  She underwent exploratory laparotomy on 11/3.  She has had an extensive ICU stay with significant events as below.   11/2 Admitted with rapid Afib 11/3 OR with findings of perforated duodenal ulcer 11/10 progressive hemorrhagic shock, intubated, transfused, pressors, proned; started on CRRT in PM 11/16 Extubated. Re-intubated overnight due to respiratory distress and hypoxia with decreased mentation 11/18 Bronch, cultures sent 11/19 Hgb down getting blood 11/24 Spiked fever, resume empirical antimicrobial therapy 11/26 Hemorrhagic shock, hgb  5.6, increased pressors,  11/30 Per palliative "[name] understanding that patient is unfortunately very fragiledespite ongoing intensive medical treatment and full mechanical support. [name] that the familywantsto continue with all current interventions despite potential outcomes". 12/08 CRRT discontinued due to clotting 12/09 Family requested transfer to  tertiary care [facility] denied transfer  12/10 CRRT restarted. Episodes of tachycardia, tachypnea that seem to improve with pain management 12/11 Back in shock. Pressor requirements up. CXR worse. ABX resumed 12/12 Still requiring inc pressors. Had hypoglycemic event. Basal insulin dosing adjusted 12/13 Pressor requirements better. Now hyperglycemic. Re-adjusted Glycemic control  12/19 Afebrile . Remains on dilaudid and heparin gtt, dilaudid gtt increased overnight for concern of pain / ongoing tachycardia, no other events . NE and precedex off 12/17. Ongoing CRRT- even UF, dosing lokelmia this morning 12/20 On CRRT. Renal plans for HD cath and moving to HD. Getting wound care 12/24 - renal stopping CRRT today and plans HD 10/24/20 . 40% fio2 on vent via Trach. TAchypenic and tachycardic. Afebrile but wbc up to 27.6K. On TF. On heparin gtt 12/25 - Back on CRRT. On vent via trach at 40% fio2, On scheduled dilaudid as add on to oxy. Per family request 12/24 - they felt scheduled oxy was not adequate and patient was showing signs of opioid withdrawal.  Patient also had worsening SIRS/sepsis syndrome. Had fever, rising wbc, worsening RR 40-60 and HR 140s sinus->started On abx yesterday. Fever some better today. WBC plateau at 28,.5K. On new levophed ->signifanct improvement in HR 77 and RR t0 20. On heparin gtt. On precedex gtt. On levophed gtt 59mg wthi midodrine. On TF 12/30 Remains on CRRT with intermittent pressor requirements. Ethics consult placed 12/29. Ethics rec time trial of CRRT 12/31 failed SBT with RR 40s. Several conversations between care tam and family, who are upset RE plan of care 1/1 back on pressors  1/4: On pressors, keeping even on CVVHD, HGB drop to 6.9, transfused 1 unit. Improving mental status 1/10 remains on low dose levophed 224m 1/11 off levo, attempting HD with UF for first time. Now tolerating intermittent HD. 1/19 patient transferred to progressive bed with  tracheostomy on 35% FiO2.  has NG tube feeding.  No PEG tube.  Received dialysis on 1/18. 1/24 significant improvement and persistent tachycardia after introduction of twice daily beta-blocker.  Heart rates have improved from the 130s to the 98-104 range 1/24 core track clogged and therefore has been removed by nutrition team.  Calorie count in progress and we are weaning any sedating medications which could be contributing to patient's inability to eat. 1/27 continue w/ cuff #6 trach   Subjective: Awake and alert.  Patient continues to have some issues regarding ongoing anxiety but is reluctant to utilize pharmacotherapy even Vistaril which is currently ordered.  Patient encouraged to utilize Vistaril if becomes anxious and usual coping mechanisms are ineffective.   Objective: Vitals:   12/14/20 0359 12/14/20 0405  BP: 122/75   Pulse: (!) 101 99  Resp: 20 20  Temp: 98.2 F (36.8 C)   SpO2: 100% 100%    Intake/Output Summary (Last 24 hours) at 12/14/2020 0751 Last data filed at 12/14/2020 0720 Gross per 24 hour  Intake 26 ml  Output 2500 ml  Net -2474 ml   Filed Weights    Exam: Constitutional: Awake alert and very interactive today. Respiratory: Bilateral lung sounds clear to auscultation, no increased work of breathing FiO2 21%. #4 cuffed trach with PMV without any significant secretions able to cough  up secretions and has not required suctioning Cardiovascular:  Abdomen: On regular diet with nectar thick liquids- LBM 2/2-core track  Skin:  Massive sacral decubitus ulcer-wound VAC-serosanguineous returns noted in [facility] collection system Neurologic: CN 2-12 grossly intact. Sensation intact, DTR normal. Strength 1/5 x all 4 extremities.  Psychiatric: Awake and alert, oriented x3, pleasant affect   Assessment/Plan: Acute problems: PAF maintaining sinus rhythm w/persistent tachycardia -Continue metoprolol to 37.5 mg BID -Echocardiogram this admission with an EF 50 to 55% with  mild diastolic dysfunction parameters and normal RV systolic function -Continue Eliquis-benefits coordinator has determined that Eliquis is covered at [facility] in [place] with a co-pay of $47  Acute respiratory failure secondary to COVID-pneumonia/tracheostomy/acute MSSA tracheobronchitis -Patient stable on low-flow oxygen FiO2 21% -PCCM/trach team following-changed to #4 trach on 2/4-has not required suctioning and is able to expectorate secretions from throat to mouth so anticipate decannulation soon -Completed IV Ancef for MSSA tracheobronchitis -Continue PMV training   Acute kidney injury secondary to COVID-related sepsis with shock superimposed on stage III chronic kidney disease -Continue intermittent hemodialysis as directed by the renal team -Avoid nephrotoxic medications -Continue Aranesp on dialysis days -Creatinine has normalized with renal attributing this to marked decrease in muscle mass from critical illness.  Plan is to follow labs and monitor urine output.  Nephrology team remains hopeful of renal recovery. -Continue midodrine on dialysis days only 3 times daily  Lower extremity muscle pain -Continue Crea topically-attending MD added topical NSAIDs on 2/10 with improvement in pain.  Patient clarifies this is not a cramping intermittent pain but a constant pain. -Continue PROM and AROM -Family to bring in tennis shoes to wear in an alternating schedule to help improve foot flexion noting this improves her leg pain  Anxiety -Vistaril prn added by attending on 2/10-added administration instruction to give at least 30 minutes prior to dialysis treatment at request of daughter -Patient reminded of availability of as needed Vistaril and has been encouraged to use as described above  Dysphagia/moderate to severe protein calorie malnutrition Nutrition Status: Nutrition Problem: Increased nutrient needs Etiology: wound healing Signs/Symptoms: estimated needs Interventions: Refer to  RD note for recommendations  Estimated body mass index is 31.04 kg/m as calculated from the following:   Height as of this encounter: _0  (1.727 m).   Weight as of this encounter: 92.6 kg.  -Continue regular diet with nectar thick liquids -Continue nocturnal tube feedings -Continue Megace and Nepro shakes orally -SLP evaluation 2/13 with recommendation to change to intermittent supervision with meals -2/14 appropriate meds changed to crush and administer with applesauce  Acute hemorrhagic shock secondary to perforated duodenal ulcer/anemia of critical illness (initial reason for admission) -Stable postoperatively -Hemoglobin remains stable greater than 8.0  Diabetes mellitus 2 -Patient has been transition to regular diet along with tube feedings and we have noted significant increase in CBG readings -CBGs increasing as PO intake increase-2/15 will add Levemir 5 units BID -HgbA1c 08/24/2020 and was 7.0 -Prior to admission patient was on metformin XR-unable to use at this juncture due to severity of kidney disease -we are hopeful for recovery of renal function  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 -SNF recommended but family wants to take patient home -Patient continues to make small but impressive improvements in mobility daily -Patient now has a power wheelchair which she has been able to utilize as of last week-does require Hoyer lift to transfer -Family request no SNF rehab due to pt prior request to NEVER be placed in any  type of facility -Continue PROM every 4 hours-patient encouraged to perform independent AROM as well -2/3 order placed for repositioning every 2 hours -2/11 continue therabands to siderails for UE resistance training  Stage IV sacral decubitus -Not present on admission -Wound care nurse following-continue now has wound VAC and hydrotherapy -Surgical team consulted.  Current recommendation is against pursuing general  anesthesia to undergo extensive surgical procedure-hopefully can be reevaluated in the future if wound does not heal adequately.  As of 1/28 they have nothing further acutely to add but will be following peripherally. -Continue [place] rotational bed  -Continue oxycodone for pain  -Continue pain assessment every 2 hours   -  11/24/2020 after [facility]      11/22/2020                       11/29/2020     12/06/2020                             12/13/2020   Incision (Closed) 09/01/20 Abdomen (Active)  Date First Assessed/Time First Assessed: 09/01/20 1737   Location: Abdomen    Assessments 09/01/2020  6:25 PM 12/12/2020  5:15 AM  Dressing Type Gauze (Comment) Gauze (Comment);Abdominal pads  Dressing -- Changed  Dressing Change Frequency -- Daily  Site / Wound Assessment Dressing in place / Unable to assess --  Margins -- Attached edges (approximated)  Closure -- None  Drainage Amount None Minimal  Drainage Description -- Serosanguineous  Treatment -- Cleansed     No Linked orders to display     Pressure Injury 09/17/20 Ear Left;Anterior;Posterior Stage 2 -  Partial thickness loss of dermis presenting as a shallow open injury with a red, pink wound bed without slough. (Active)  Date First Assessed/Time First Assessed: 09/17/20 0800   Location: Ear  Location Orientation: Left;Anterior;Posterior  Staging: Stage 2 -  Partial thickness loss of dermis presenting as a shallow open injury with a red, pink wound bed without slough. ...    Assessments 09/17/2020  8:00 AM 12/05/2020  8:00 PM  Dressing Type Foam - Lift dressing to assess site every shift None  Dressing Clean;Dry;Intact --  Dressing Change Frequency Every 3 days --  Site / Wound Assessment Dry;Pink --  Peri-wound Assessment Intact --  Wound Length (cm) 2 cm --  Wound Width (cm) 1 cm --  Wound Depth (cm) 0.25 cm --  Wound Surface Area (cm^2) 2 cm^2 --  Wound Volume (cm^3) 0.5 cm^3 --  Margins Unattached edges (unapproximated) --  Drainage  Amount Scant --  Drainage Description Serosanguineous --  Treatment Cleansed --     No Linked orders to display     Pressure Injury 09/25/20 Sacrum Bilateral;Medial Deep Tissue Pressure Injury - Purple or maroon localized area of discolored intact skin or blood-filled blister due to damage of underlying soft tissue from pressure and/or shear. Purple, non-blanchable, b (Active)  Date First Assessed/Time First Assessed: 09/25/20 2000   Location: Sacrum  Location Orientation: Bilateral;Medial  Staging: Deep Tissue Pressure Injury - Purple or maroon localized area of discolored intact skin or blood-filled blister due to damage o...    Assessments 09/25/2020  5:00 PM 12/13/2020 12:44 PM  Wound Image     Dressing Type Foam - Lift dressing to assess site every shift Barrier Film (skin prep);Negative pressure wound therapy  Dressing Changed;Clean;Dry;Intact Changed  Dressing Change Frequency -- Monday, Wednesday, Friday  State of Healing --  Early/partial granulation  Site / Wound Assessment -- Pink;Yellow;Black  % Wound base Red or Granulating -- 45%  % Wound base Yellow/Fibrinous Exudate -- 50%  % Wound base Black/Eschar -- 5%  % Wound base Other/Granulation Tissue (Comment) -- 0%  Drainage Amount -- Minimal  Drainage Description -- Sanguineous  Treatment -- Debridement (Selective);Negative pressure wound therapy     No Linked orders to display     Negative Pressure Wound Therapy Sacrum (Active)  Placement Date/Time: 11/22/20 1500   Wound Type: (c) Other (Comment)  Location: Sacrum    Assessments 11/22/2020  4:59 PM 12/14/2020  7:20 AM  Last dressing change 11/22/20 --  Site / Wound Assessment Granulation tissue;Pale;Yellow Clean;Dry;Pink;Brown  Peri-wound Assessment Intact Intact  Size see above --  Wound filler - Black foam 2 --  Wound filler - White foam 0 --  Wound filler - Nonadherent 0 --  Wound filler - Gauze 0 --  Cycle Continuous --  Target Pressure (mmHg) 125 --  Instillation  Volume 26 mL --  Instillation Solution Normal Saline --  Instillation Soak Time 10 minutes --  Instillation Therapy Time 3.5 hours --  Canister Changed No --  Dressing Status Intact Intact  Drainage Amount None None  Output (mL) -- 20 mL     No Linked orders to display     Other problems: Coag neg staph-1/2 blood cultures positive -dw ID pharmacist- cultures c/w contamination Vancomycin ordered overnight stopped on 2/8  Acute encephalopathy secondary to prolonged hospital stay -Resolved  History of stage IV colon cancer -Old colostomy  Acute encephalopathy with ICU delirium -Delirium has resolved and supportive meds of Klonopin and Seroquel have been weaned and discontinued  Hypomagnesemia -Continue oral magnesium -1/21 give 4 g magnesium IV x1 -Follow labs  DVT bilateral posterior tibial veins -Was not present during initial COVID admission -Continue Eliquis for now given severe debility and increased risk for developing recurrent DVT  Hypertension -Having issues with orthostasis requiring midodrine Prior to admission patient was on Norvasc  Dyslipidemia -Prior to admission patient was on Crestor-consider resuming soon once patient can swallow pills whole  Abnormal TSH -Nov 2021 TSH was 0.015-TSH and free T4, T3 normal this admission  Data Reviewed: Basic Metabolic Panel: Recent Labs  Lab 12/08/20 0303 12/09/20 0903 12/11/20 0643 12/12/20 1100 12/13/20 0119  NA 136 137 140 131* 132*  K 3.3* 3.1* 2.6* 4.2 4.5  CL 101 102 113* 99 99  CO2 23 23 18* 20* 21*  GLUCOSE 203* 163* 103* 220* 234*  BUN 23 63* 66* 76* 90*  CREATININE 0.95 1.00 0.64 1.05* 1.00  CALCIUM 8.8* 10.7* 8.3* 10.2 10.0  MG  --   --  1.2*  --   --   PHOS 2.8 3.2  --  4.1 4.1   Liver Function Tests: Recent Labs  Lab 12/08/20 0303 12/09/20 0903 12/11/20 0643 12/12/20 1100 12/13/20 0119  AST  --   --  15  --   --   ALT  --   --  13  --   --   ALKPHOS  --   --  63  --   --   BILITOT   --   --  0.4  --   --   PROT  --   --  5.8*  --   --   ALBUMIN 2.2* 2.2* 1.8* 2.5* 2.4*   No results for input(s): LIPASE, AMYLASE in the last 168 hours. No results for input(s): AMMONIA in the  last 168 hours. CBC: Recent Labs  Lab 12/08/20 0303 12/09/20 0903 12/11/20 0643  WBC 17.5* 16.5* 15.7*  NEUTROABS 12.9*  --   --   HGB 7.8* 8.1* 7.9*  HCT 24.8* 26.8* 25.1*  MCV 86.1 87.9 85.7  PLT 393 443* 283   Cardiac Enzymes: No results for input(s): CKTOTAL, CKMB, CKMBINDEX, TROPONINI in the last 168 hours. BNP (last 3 results) Recent Labs    09/07/20 0118 09/08/20 0446 09/09/20 0428  BNP 216.8* 432.5* 609.7*    ProBNP (last 3 results) No results for input(s): PROBNP in the last 8760 hours.  CBG: Recent Labs  Lab 12/13/20 1728 12/13/20 1943 12/14/20 0043 12/14/20 0518 12/14/20 0736  GLUCAP 232* 270* 316* 329* 264*    Recent Results (from the past 240 hour(s))  Culture, blood (routine x 2)     Status: Abnormal   Collection Time: 12/05/20 11:07 AM   Specimen: BLOOD RIGHT ARM  Result Value Ref Range Status   Specimen Description BLOOD RIGHT ARM  Final   Special Requests   Final    BOTTLES DRAWN AEROBIC ONLY Blood Culture adequate volume   Culture  Setup Time   Final    GRAM POSITIVE COCCI IN CLUSTERS AEROBIC BOTTLE ONLY CRITICAL RESULT CALLED TO, READ BACK BY AND VERIFIED WITH: Tillman Sers PHARMD 2000 12/06/20 A BROWNING    Culture (A)  Final    STAPHYLOCOCCUS EPIDERMIDIS THE SIGNIFICANCE OF ISOLATING THIS ORGANISM FROM A SINGLE SET OF BLOOD CULTURES WHEN MULTIPLE SETS ARE DRAWN IS UNCERTAIN. PLEASE NOTIFY THE MICROBIOLOGY DEPARTMENT WITHIN ONE WEEK IF SPECIATION AND SENSITIVITIES ARE REQUIRED. Performed at [facility], [place] [address], [place], [place] [address]    Report Status 12/08/2020 FINAL  Final  Culture, blood (routine x 2)     Status: None   Collection Time: 12/05/20 11:07 AM   Specimen: BLOOD LEFT HAND  Result Value Ref Range Status   Specimen  Description BLOOD LEFT HAND  Final   Special Requests   Final    BOTTLES DRAWN AEROBIC ONLY Blood Culture adequate volume   Culture   Final    NO GROWTH 5 DAYS Performed at [facility], [place] [address], [place], [place] [address]    Report Status 12/10/2020 FINAL  Final  Blood Culture ID Panel (Reflexed)     Status: Abnormal   Collection Time: 12/05/20 11:07 AM  Result Value Ref Range Status   Enterococcus faecalis NOT DETECTED NOT DETECTED Final   Enterococcus Faecium NOT DETECTED NOT DETECTED Final   Listeria monocytogenes NOT DETECTED NOT DETECTED Final   Staphylococcus species DETECTED (A) NOT DETECTED Final    Comment: CRITICAL RESULT CALLED TO, READ BACK BY AND VERIFIED WITH: Tillman Sers PHARMD 2000 12/06/20 A BROWNING    Staphylococcus aureus (BCID) NOT DETECTED NOT DETECTED Final   Staphylococcus epidermidis DETECTED (A) NOT DETECTED Final    Comment: Methicillin (oxacillin) resistant coagulase negative staphylococcus. Possible blood culture contaminant (unless isolated from more than one blood culture draw or clinical case suggests pathogenicity). No antibiotic treatment is indicated for blood  culture contaminants. CRITICAL RESULT CALLED TO, READ BACK BY AND VERIFIED WITH: Tillman Sers PHARMD 2000 12/06/20 A BROWNING    Staphylococcus lugdunensis NOT DETECTED NOT DETECTED Final   Streptococcus species NOT DETECTED NOT DETECTED Final   Streptococcus agalactiae NOT DETECTED NOT DETECTED Final   Streptococcus pneumoniae NOT DETECTED NOT DETECTED Final   Streptococcus pyogenes NOT DETECTED NOT DETECTED Final   A.calcoaceticus-baumannii NOT DETECTED  NOT DETECTED Final   Bacteroides fragilis NOT DETECTED NOT DETECTED Final   Enterobacterales NOT DETECTED NOT DETECTED Final   Enterobacter cloacae complex NOT DETECTED NOT DETECTED Final   Escherichia coli NOT DETECTED NOT DETECTED Final   Klebsiella aerogenes NOT DETECTED NOT DETECTED Final   Klebsiella oxytoca NOT DETECTED NOT  DETECTED Final   Klebsiella pneumoniae NOT DETECTED NOT DETECTED Final   Proteus species NOT DETECTED NOT DETECTED Final   Salmonella species NOT DETECTED NOT DETECTED Final   Serratia marcescens NOT DETECTED NOT DETECTED Final   Haemophilus influenzae NOT DETECTED NOT DETECTED Final   Neisseria meningitidis NOT DETECTED NOT DETECTED Final   Pseudomonas aeruginosa NOT DETECTED NOT DETECTED Final   Stenotrophomonas maltophilia NOT DETECTED NOT DETECTED Final   Candida albicans NOT DETECTED NOT DETECTED Final   Candida auris NOT DETECTED NOT DETECTED Final   Candida glabrata NOT DETECTED NOT DETECTED Final   Candida krusei NOT DETECTED NOT DETECTED Final   Candida parapsilosis NOT DETECTED NOT DETECTED Final   Candida tropicalis NOT DETECTED NOT DETECTED Final   Cryptococcus neoformans/gattii NOT DETECTED NOT DETECTED Final   Methicillin resistance mecA/C DETECTED (A) NOT DETECTED Final    Comment: CRITICAL RESULT CALLED TO, READ BACK BY AND VERIFIED WITH: Tillman Sers PHARMD 2000 12/06/20 A BROWNING Performed at [facility], [address] [place], [place] [address]      Studies: No results found.  Scheduled Meds: . acetaminophen (TYLENOL) oral liquid 160 mg/5 mL  650 mg Oral Q6H  . apixaban  2.5 mg Oral BID  . carbamide peroxide  10 drop Both EARS BID  . chlorhexidine gluconate (MEDLINE KIT)  15 mL Mouth Rinse BID  . Chlorhexidine Gluconate Cloth  6 each Topical Q0600  . cholecalciferol  2,000 Units Oral Daily  . collagenase   Topical BID  . darbepoetin (ARANESP) injection - DIALYSIS  100 mcg Intravenous Q Sat-HD  . diclofenac Sodium  2 g Topical QID  . feeding supplement (NEPRO CARB STEADY)  237 mL Oral TID BM  . feeding supplement (NEPRO CARB STEADY)  780 mL Per Tube Q24H  . guaiFENesin  15 mL Oral Q12H  . insulin aspart  0-6 Units Subcutaneous TID WC  . insulin detemir  5 Units Subcutaneous BID  . mouth rinse  15 mL Mouth Rinse QID  . megestrol  200 mg Oral QID  .  metoprolol tartrate  37.5 mg Oral BID  . midodrine  40 mg Oral 3 times per day on Tue Thu Sat  . multivitamin  1 tablet Oral QHS  . nutrition supplement (JUVEN)  1 packet Per Tube BID BM  . nystatin   Topical BID  . pantoprazole sodium  40 mg Oral Daily  . sodium chloride flush  10-40 mL Intracatheter Q12H   Continuous Infusions: . albumin human 25 g (11/16/20 1516)    Principal Problem:   Atrial fibrillation with RVR (HCC) Active Problems:   Acute respiratory failure due to COVID-19 (HCC)   New onset a-fib (HCC)   Leukocytosis   Atrial fibrillation with rapid ventricular response (HCC)   Hypoxia   Pressure injury of skin   Acute hypoxemic respiratory failure (HCC)   ARDS (adult respiratory distress syndrome) (HCC)   Perforated duodenal ulcer ([place])   On mechanically assisted ventilation ([place])   Palliative care by specialist   Goals of care, counseling/discussion   Shock ([place])   Acute and chronic respiratory failure (acute-on-chronic) ([place])   Status post tracheostomy (  [place])   ESRD (end stage renal disease) ([place])   HAP (hospital-acquired pneumonia)   Consultants:  Cardiology  Surgery  Nephrology  Ethics  PCCM   Procedures: R PICC 11/5 >> A line 11/9 >> out ETT 11/9 > 11/16, 11/16 >> 09/21/2020, 09/21/2020 tracheostomy>> Lt [place] CVL 11/9 >> R IJ trialysis >> out HD catheter 12/1 >>12/20 12/21 - 14.5 Fr, 23 cm right IJ tunneled hemodialysis catheter placement. Removal of indwelling subclavian catheter.   Antibiotics: Anti-infectives (From admission, onward)   Start     Dose/Rate Route Frequency Ordered Stop   12/07/20 1200  vancomycin (VANCOCIN) IVPB 1000 mg/200 mL premix  Status:  Discontinued        1,000 mg 200 mL/hr over 60 Minutes Intravenous Every T-Th-Sa (Hemodialysis) 12/06/20 2156 12/07/20 1113   12/06/20 2245  vancomycin (VANCOREADY) IVPB 2000 mg/400 mL        2,000 mg 200 mL/hr over 120 Minutes Intravenous  Once 12/06/20 2156 12/07/20 0211    11/27/20 2000  ceFAZolin (ANCEF) IVPB 1 g/50 mL premix        1 g 100 mL/hr over 30 Minutes Intravenous Every 24 hours 11/26/20 1248 12/03/20 2136   11/26/20 1345  ceFAZolin (ANCEF) IVPB 1 g/50 mL premix        1 g 100 mL/hr over 30 Minutes Intravenous  Once 11/26/20 1248 11/26/20 1433   11/17/20 1415  fluconazole (DIFLUCAN) 40 MG/ML suspension 200 mg        200 mg Oral  Once 11/17/20 1315 11/17/20 1357   11/03/20 2200  meropenem (MERREM) 500 mg in sodium chloride 0.9 % 100 mL IVPB        500 mg 200 mL/hr over 30 Minutes Intravenous Every 24 hours 11/03/20 1500 11/06/20 2217   11/01/20 1000  anidulafungin (ERAXIS) 100 mg in sodium chloride 0.9 % 100 mL IVPB  Status:  Discontinued       "Followed by" Linked Group Details   100 mg 78 mL/hr over 100 Minutes Intravenous Every 24 hours 10/31/20 0916 11/01/20 1404   10/31/20 1015  meropenem (MERREM) 1 g in sodium chloride 0.9 % 100 mL IVPB  Status:  Discontinued        1 g 200 mL/hr over 30 Minutes Intravenous Every 8 hours 10/31/20 0916 11/03/20 1500   10/31/20 1015  linezolid (ZYVOX) IVPB 600 mg  Status:  Discontinued        600 mg 300 mL/hr over 60 Minutes Intravenous Every 12 hours 10/31/20 0916 11/02/20 0906   10/31/20 1015  anidulafungin (ERAXIS) 200 mg in sodium chloride 0.9 % 200 mL IVPB       "Followed by" Linked Group Details   200 mg 78 mL/hr over 200 Minutes Intravenous  Once 10/31/20 0916 10/31/20 1630   10/25/20 1800  ceFAZolin (ANCEF) IVPB 2g/100 mL premix  Status:  Discontinued        2 g 200 mL/hr over 30 Minutes Intravenous Every 12 hours 10/25/20 1022 10/30/20 1103   10/23/20 2000  vancomycin (VANCOREADY) IVPB 750 mg/150 mL  Status:  Discontinued        750 mg 150 mL/hr over 60 Minutes Intravenous Every 24 hours 10/22/20 2036 10/25/20 1022   10/23/20 1800  piperacillin-tazobactam (ZOSYN) IVPB 3.375 g  Status:  Discontinued        3.375 g 100 mL/hr over 30 Minutes Intravenous Every 6 hours 10/23/20 1155 10/24/20 1422    10/23/20 0200  piperacillin-tazobactam (ZOSYN) IVPB 3.375 g  Status:  Discontinued  3.375 g 100 mL/hr over 30 Minutes Intravenous Every 8 hours 10/22/20 2036 10/23/20 1155   10/22/20 1630  vancomycin (VANCOREADY) IVPB 1500 mg/300 mL        1,500 mg 150 mL/hr over 120 Minutes Intravenous  Once 10/22/20 1537 10/22/20 2229   10/22/20 1630  piperacillin-tazobactam (ZOSYN) IVPB 2.25 g  Status:  Discontinued        2.25 g 100 mL/hr over 30 Minutes Intravenous Every 8 hours 10/22/20 1537 10/22/20 2036   10/22/20 1537  vancomycin variable dose per unstable renal function (pharmacist dosing)  Status:  Discontinued         Does not apply See admin instructions 10/22/20 1537 10/22/20 2036   10/19/20 0600  ceFAZolin (ANCEF) IVPB 2g/100 mL premix        2 g 200 mL/hr over 30 Minutes Intravenous To Radiology 10/18/20 1457 10/19/20 0938   10/10/20 0830  vancomycin (VANCOCIN) IVPB 1000 mg/200 mL premix       "Followed by" Linked Group Details   1,000 mg 200 mL/hr over 60 Minutes Intravenous Every 24 hours 10/09/20 0744 10/15/20 0902   10/09/20 0830  piperacillin-tazobactam (ZOSYN) IVPB 3.375 g        3.375 g 100 mL/hr over 30 Minutes Intravenous Every 6 hours 10/09/20 0744 10/16/20 0038   10/09/20 0830  vancomycin (VANCOREADY) IVPB 2000 mg/400 mL       "Followed by" Linked Group Details   2,000 mg 200 mL/hr over 120 Minutes Intravenous  Once 10/09/20 0744 10/09/20 1022   09/24/20 1000  ceFAZolin (ANCEF) IVPB 2g/100 mL premix  Status:  Discontinued        2 g 200 mL/hr over 30 Minutes Intravenous Every 12 hours 09/24/20 0801 09/24/20 1046   09/23/20 1400  vancomycin (VANCOCIN) IVPB 1000 mg/200 mL premix        1,000 mg 200 mL/hr over 60 Minutes Intravenous Every 24 hours 09/22/20 1436 09/28/20 1718   09/22/20 2200  ceFEPIme (MAXIPIME) 2 g in sodium chloride 0.9 % 100 mL IVPB  Status:  Discontinued        2 g 200 mL/hr over 30 Minutes Intravenous Every 12 hours 09/22/20 1436 09/24/20 0801    09/22/20 1030  ceFEPIme (MAXIPIME) 1 g in sodium chloride 0.9 % 100 mL IVPB        1 g 200 mL/hr over 30 Minutes Intravenous  Once 09/22/20 0934 09/22/20 1145   09/22/20 1015  vancomycin (VANCOCIN) IVPB 1000 mg/200 mL premix        1,000 mg 200 mL/hr over 60 Minutes Intravenous  Once 09/22/20 0934 09/22/20 1446   09/12/20 2200  ceFEPIme (MAXIPIME) 2 g in sodium chloride 0.9 % 100 mL IVPB        2 g 200 mL/hr over 30 Minutes Intravenous Every 12 hours 09/12/20 0732 09/14/20 2134   09/11/20 1400  ceFEPIme (MAXIPIME) 2 g in sodium chloride 0.9 % 100 mL IVPB  Status:  Discontinued        2 g 200 mL/hr over 30 Minutes Intravenous Every 8 hours 09/11/20 1126 09/12/20 0732   09/08/20 1000  vancomycin (VANCOREADY) IVPB 2000 mg/400 mL        2,000 mg 200 mL/hr over 120 Minutes Intravenous  Once 09/08/20 0857 09/08/20 1224   09/08/20 1000  ceFEPIme (MAXIPIME) 2 g in sodium chloride 0.9 % 100 mL IVPB  Status:  Discontinued        2 g 200 mL/hr over 30 Minutes Intravenous Every 12 hours  09/08/20 0857 09/11/20 1126   09/08/20 0856  vancomycin variable dose per unstable renal function (pharmacist dosing)  Status:  Discontinued         Does not apply See admin instructions 09/08/20 0857 09/09/20 0935   09/02/20 1600  cefTRIAXone (ROCEPHIN) 1 g in sodium chloride 0.9 % 100 mL IVPB  Status:  Discontinued        1 g 200 mL/hr over 30 Minutes Intravenous Every 24 hours 09/01/20 1811 09/02/20 0838   09/01/20 1800  fluconazole (DIFLUCAN) IVPB 400 mg        400 mg 50 mL/hr over 240 Minutes Intravenous  Once 09/01/20 1749 09/02/20 0603   09/01/20 1530  piperacillin-tazobactam (ZOSYN) IVPB 3.375 g        3.375 g 12.5 mL/hr over 240 Minutes Intravenous Every 8 hours 09/01/20 1514 09/05/20 2111   09/01/20 1000  levofloxacin (LEVAQUIN) tablet 250 mg  Status:  Discontinued        250 mg Oral Daily 08/31/20 1508 08/31/20 1735   08/31/20 1730  cefTRIAXone (ROCEPHIN) 1 g in sodium chloride 0.9 % 100 mL IVPB  Status:   Discontinued        1 g 200 mL/hr over 30 Minutes Intravenous Every 24 hours 08/31/20 1726 09/01/20 1513   08/31/20 1730  azithromycin (ZITHROMAX) 500 mg in sodium chloride 0.9 % 250 mL IVPB  Status:  Discontinued        500 mg 250 mL/hr over 60 Minutes Intravenous Every 24 hours 08/31/20 1726 09/02/20 0838       Time spent: 40 minutes    [name] ANP  [facility] 7 am - 330 pm/M-F for direct patient care and secure chat Please refer to Amion for contact info 104  days

## 2020-12-14 NOTE — Progress Notes (Signed)
12/14/2020 Assisted in positioning pt back in the bed from electric WC with pt's daughter assist.  Pt sat up in the electric WC for nearly three hours today (a new record).  Worth trying to put roho in recliner chair to simulate recliner chair at HD next OOB session.  PT will continue to follow acutely for safe mobility progression     12/14/20 1709  PT Visit Information  Last PT Received On 12/14/20  Assistance Needed +2  History of Present Illness Pt 70 y.o. female with medical history significant for recent covid pna, obesity, colon cancer s/p colon resection with colostomy bag, HLD, NIDDM2, CKD3 presented to ED after her follow-up nurse advised her to present to ED for elevated HR. +afib, elevated troponins with demand ischemia,  CT scan is positive for bowel perforation with pneumomediastinum 11/03 exp lap with repair of perforated ulcer. 11/10 t/f to ICU- intubated/sedated/proned. Trach placed 11/24. CRRT off 12/8, restarted 12/10-1/5. 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  Subjective Data  Patient Stated Goal daughter would like her OOB  Precautions  Precautions Fall  Precaution Comments baseline R colostomy; sacral wound with wound vac, trach capped  Pain Assessment  Pain Assessment Faces  Faces Pain Scale 8  Pain Location bottom  Pain Descriptors / Indicators Guarding;Grimacing  Pain Intervention(s) Limited activity within patient's tolerance;Monitored during session;Repositioned  Cognition  Arousal/Alertness Awake/alert  Behavior During Therapy Flat affect  Overall Cognitive Status Impaired/Different from baseline  Area of Impairment Following commands;Problem solving  Following Commands Follows one step commands with increased time  Problem Solving Slow processing  General Comments Same as previous session  Bed Mobility  Overal bed mobility Needs Assistance  Bed Mobility Rolling  Rolling  Max assist;+2 for physical assistance  General bed mobility comments two person max assist to roll bil for lift pad placement, pt attempting to assist with reach across of arm, therapist/daughter positioning outside knee into flexion and heavy use of bed pad to control hips.  Transfers  Overall transfer level Needs assistance  Transfer via [place] transfer comment Maxi move from [facility] back to bed.  Pt positioned on L side for pressure relief.  General Comments  General comments (skin integrity, edema, etc.) Daughter assisted and fully engaged in session.  HR better today in the 80s-90s. PRAFO placed on R ankle at end of session.  PT - End of Session  Activity Tolerance Patient limited by pain  Patient left in chair;with call bell/phone within reach;with family/visitor present  Nurse Communication Mobility status;Need for lift equipment   PT - Assessment/Plan  PT Plan Current plan remains appropriate  PT Visit Diagnosis Muscle weakness (generalized) (M62.81);Difficulty in walking, not elsewhere classified (R26.2);Pain;Adult, failure to thrive (R62.7);Unsteadiness on feet (R26.81)  Pain - Right/Left Right  Pain - part of body Knee  PT Frequency (ACUTE ONLY) Min 2X/week  Recommendations for Other Services OT consult  Follow Up Recommendations SNF  PT equipment Hospital bed;Other (comment) (hoyer lift, custom wheelchair, air mattress overlay)  AM-PAC PT "6 Clicks" Mobility Outcome Measure (Version 2)  Help needed turning from your back to your side while in a flat bed without using bedrails? 2  Help needed moving from lying on your back to sitting on the side of a flat bed without using bedrails? 1  Help needed moving to and from a bed to a chair (including a wheelchair)? 1  Help  needed standing up from a chair using your arms (e.g., wheelchair or bedside chair)? 1  Help needed to walk in hospital room? 1  Help needed climbing 3-5 steps with a railing?  1  6 Click Score  7  Consider Recommendation of Discharge To: CIR/SNF/LTACH  Acute Rehab PT Goals  PT Goal Formulation With patient/family  Time For Goal Achievement 12/28/20  Potential to Achieve Goals Fair  PT Time Calculation  PT Start Time (ACUTE ONLY) 1611  PT Stop Time (ACUTE ONLY) 1640  PT Time Calculation (min) (ACUTE ONLY) 29 min  PT General Charges  $$ ACUTE PT VISIT 1 Visit  PT Treatments  $Therapeutic Activity 23-37 mins  Verdene Lennert, PT, DPT  Acute Rehabilitation [phone] pager [phone]) [phone] office

## 2020-12-14 NOTE — Progress Notes (Signed)
Physical Therapy Treatment Patient Details Name: Francess Mullen MRN: [id number] DOB: 17-Jan-1951 Today's Date: 12/14/2020    History of Present Illness Pt 70 y.o. female with medical history significant for recent covid pna, obesity, colon cancer s/p colon resection with colostomy bag, HLD, NIDDM2, CKD3 presented to ED after her follow-up nurse advised her to present to ED for elevated HR. +afib, elevated troponins with demand ischemia,  CT scan is positive for bowel perforation with pneumomediastinum 11/03 exp lap with repair of perforated ulcer. 11/10 t/f to ICU- intubated/sedated/proned. Trach placed 11/24. CRRT off 12/8, restarted 12/10-1/5. 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    PT Comments    Contacted secondary to family request for OOB to chair assist for Ms. Kantner.  Pt continues to attempt to assist with rolling, placed in maxi move total lift and positioned in electric WC.  Reviewed with pt/daughter (and myself) functionality of electric WC and pt able to return demo ~50% of education given, cues for completeness.  Daughter present and providing +2 assist to therapist and fully engaged in session. Goals updated.     Follow Up Recommendations  SNF     Equipment Recommendations  Hospital bed;Other (comment) (hoyer lift, custom wheelchair, air mattress overlay)    Recommendations for Other Services OT consult     Precautions / Restrictions Precautions Precautions: Fall Precaution Comments: baseline R colostomy; sacral wound with wound vac, trach capped    Mobility  Bed Mobility Overal bed mobility: Needs Assistance Bed Mobility: Rolling Rolling: Max assist;+2 for physical assistance         General bed mobility comments: two person max assist to roll bil for lift pad placement, pt attempting to assist with reach across of arm, therapist/daughter positioning outside knee into flexion and  heavy use of bed pad to control hips.    Transfers Overall transfer level: Needs assistance               General transfer comment: Maxi move for OOB to [facility]  Ambulation/Gait                 Theme park manager mobility: Yes Wheelchair parts: Needs assistance Wheelchair Assistance Details (indicate cue type and reason): Min assist/cues for joystick control.  Pt able to return demo ~50% of given cues.  Modified Rankin (Stroke Patients Only)       Balance                                            Cognition Arousal/Alertness: Awake/alert Behavior During Therapy: Flat affect Overall Cognitive Status: Impaired/Different from baseline Area of Impairment: Following commands;Problem solving                       Following Commands: Follows one step commands with increased time     Problem Solving: Slow processing General Comments: Still a bit slow to process, however, was return demonstrating instructions on how to use electric WC joystick to control seat tilt turn on/off, etc with min cues/reminders.      Exercises      General Comments        Pertinent Vitals/Pain Pain Assessment: Faces Faces Pain Scale: Hurts even more  Pain Location: bottom Pain Descriptors / Indicators: Guarding;Grimacing Pain Intervention(s): Limited activity within patient's tolerance;Monitored during session;Repositioned    Home Living                      Prior Function            PT Goals (current goals can now be found in the care plan section) Acute Rehab PT Goals Patient Stated Goal: daughter would like her OOB PT Goal Formulation: With patient/family Time For Goal Achievement: 12/28/20 Potential to Achieve Goals: Fair Progress towards PT goals: Progressing toward goals    Frequency    Min 2X/week      PT Plan Current plan remains appropriate    Co-evaluation               AM-PAC PT "6 Clicks" Mobility   Outcome Measure  Help needed turning from your back to your side while in a flat bed without using bedrails?: A Lot Help needed moving from lying on your back to sitting on the side of a flat bed without using bedrails?: Total Help needed moving to and from a bed to a chair (including a wheelchair)?: Total Help needed standing up from a chair using your arms (e.g., wheelchair or bedside chair)?: Total Help needed to walk in hospital room?: Total Help needed climbing 3-5 steps with a railing? : Total 6 Click Score: 7    End of Session   Activity Tolerance: Patient limited by pain Patient left: in chair;with call bell/phone within reach;with family/visitor present Nurse Communication: Mobility status;Need for lift equipment PT Visit Diagnosis: Muscle weakness (generalized) (M62.81);Difficulty in walking, not elsewhere classified (R26.2);Pain;Adult, failure to thrive (R62.7);Unsteadiness on feet (R26.81) Pain - Right/Left: Right Pain - part of body: Knee     Time: 1250-1340 PT Time Calculation (min) (ACUTE ONLY): 50 min  Charges:  $Therapeutic Activity: 23-37 mins $Wheel Chair Management: 8-22 mins                    Verdene Lennert, PT, DPT  Acute Rehabilitation [phone] pager [phone]) [phone] office

## 2020-12-14 NOTE — Progress Notes (Signed)
[facility] ROUNDING NOTE   Subjective:   Interval History: This is a 70 year old lady with a history of recent Covid pneumonia obesity: Status post colon resection and colostomy bag diabetes mellitus and acute kidney injury requiring dialysis.  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  Blood pressure 122/75 pulse 102 temperature 98.2 O2 sats 90% FiO2 21% room air.  Urine output 1.2 L 12/13/2020  Sodium 132 potassium 4.5 chloride 99 CO2 21 BUN 90 creatinine 1 glucose 234 calcium 10 phosphorus 4.1 albumin 2.4.  Last dialysis treatment 12/11/2020 with removal of 1.4 L    Objective:  Vital signs in last 24 hours:  Temp:  [98 F (36.7 C)-98.7 F (37.1 C)] 98.2 F (36.8 C) (02/15 0359) Pulse Rate:  [99-110] 99 (02/15 0405) Resp:  [18-20] 20 (02/15 0405) BP: (111-129)/(68-104) 122/75 (02/15 0359) SpO2:  [100 %] 100 % (02/15 0405) FiO2 (%):  [21 %] 21 % (02/15 0405)  Weight change:  Filed Weights    Intake/Output: I/O last 3 completed shifts: In: 36 [Other:26] Out: 550 [Urine:500; Drains:50]   Intake/Output this shift:  Total I/O In: -  Out: 4401 [Urine:1600; Emesis/NG output:455; Drains:325; Stool:50]  General:NAD, resting comfortably, has tracheostomy Heart:RRR, s1s2 nl Lungs:clear b/l, no crackle Abdomen:soft, Non-tender, non-distended Extremities: Trace dependent edema. Dialysis Access: Right IJ TDC in place. Neurology: Alert awake and following commands.   Basic Metabolic Panel: Recent Labs  Lab 12/08/20 0303 12/09/20 0903 12/11/20 0643 12/12/20 1100 12/13/20 0119  NA 136 137 140 131* 132*  K 3.3* 3.1* 2.6* 4.2 4.5  CL 101 102 113* 99 99  CO2 23 23 18* 20* 21*  GLUCOSE 203* 163* 103* 220* 234*  BUN 23 63* 66* 76* 90*  CREATININE 0.95 1.00 0.64 1.05* 1.00  CALCIUM 8.8* 10.7* 8.3* 10.2 10.0  MG  --   --  1.2*  --   --   PHOS 2.8 3.2  --  4.1 4.1    Liver Function  Tests: Recent Labs  Lab 12/08/20 0303 12/09/20 0903 12/11/20 0643 12/12/20 1100 12/13/20 0119  AST  --   --  15  --   --   ALT  --   --  13  --   --   ALKPHOS  --   --  63  --   --   BILITOT  --   --  0.4  --   --   PROT  --   --  5.8*  --   --   ALBUMIN 2.2* 2.2* 1.8* 2.5* 2.4*   No results for input(s): LIPASE, AMYLASE in the last 168 hours. No results for input(s): AMMONIA in the last 168 hours.  CBC: Recent Labs  Lab 12/08/20 0303 12/09/20 0903 12/11/20 0643  WBC 17.5* 16.5* 15.7*  NEUTROABS 12.9*  --   --   HGB 7.8* 8.1* 7.9*  HCT 24.8* 26.8* 25.1*  MCV 86.1 87.9 85.7  PLT 393 443* 283    Cardiac Enzymes: No results for input(s): CKTOTAL, CKMB, CKMBINDEX, TROPONINI in the last 168 hours.  BNP: Invalid input(s): POCBNP  CBG: Recent Labs  Lab 12/13/20 1213 12/13/20 1728 12/13/20 1943 12/14/20 0043 12/14/20 0518  GLUCAP 153* 232* 270* 316* 329*    Microbiology: Results for orders placed or performed during the hospital encounter of 08/31/20  MRSA PCR Screening     Status: None   Collection Time: 09/08/20  8:17 AM   Specimen: Nasal  Mucosa; Nasopharyngeal  Result Value Ref Range Status   MRSA by PCR NEGATIVE NEGATIVE Final    Comment:        The GeneXpert MRSA Assay (FDA approved for NASAL specimens only), is one component of a comprehensive MRSA colonization surveillance program. It is not intended to diagnose MRSA infection nor to guide or monitor treatment for MRSA infections. Performed at [facility], [place] [address], [place], [place] [address]   Culture, blood (Routine X 2) w Reflex to ID Panel     Status: None   Collection Time: 09/08/20  8:35 AM   Specimen: BLOOD LEFT HAND  Result Value Ref Range Status   Specimen Description BLOOD LEFT HAND  Final   Special Requests   Final    BOTTLES DRAWN AEROBIC ONLY Blood Culture results may not be optimal due to an inadequate volume of blood received in culture bottles   Culture   Final     NO GROWTH 5 DAYS Performed at [facility], [place] [address], [place], [place] [address]    Report Status 09/13/2020 FINAL  Final  Culture, blood (Routine X 2) w Reflex to ID Panel     Status: None   Collection Time: 09/08/20  8:35 AM   Specimen: BLOOD RIGHT HAND  Result Value Ref Range Status   Specimen Description BLOOD RIGHT HAND  Final   Special Requests   Final    BOTTLES DRAWN AEROBIC ONLY Blood Culture results may not be optimal due to an inadequate volume of blood received in culture bottles   Culture   Final    NO GROWTH 5 DAYS Performed at [facility], [place] [address] [place], [place] [address]    Report Status 09/13/2020 FINAL  Final  Culture, respiratory (non-expectorated)     Status: None   Collection Time: 09/16/20  8:35 AM   Specimen: Tracheal Aspirate; Respiratory  Result Value Ref Range Status   Specimen Description TRACHEAL ASPIRATE  Final   Special Requests NONE  Final   Gram Stain   Final    ABUNDANT WBC PRESENT, PREDOMINANTLY PMN RARE YEAST    Culture   Final    FEW Normal respiratory flora-no Staph aureus or Pseudomonas seen Performed at [facility], [address] [place], [place] [address]    Report Status 09/18/2020 FINAL  Final  Culture, respiratory (non-expectorated)     Status: None   Collection Time: 09/16/20 11:03 AM   Specimen: Bronchoalveolar Lavage; Respiratory  Result Value Ref Range Status   Specimen Description BRONCHIAL ALVEOLAR LAVAGE  Final   Special Requests NONE  Final   Gram Stain   Final    ABUNDANT WBC PRESENT, PREDOMINANTLY PMN RARE GRAM POSITIVE COCCI Performed at [facility], [place] [address], [place], [place] [address]    Culture FEW CANDIDA ALBICANS  Final   Report Status 09/18/2020 FINAL  Final  Culture, respiratory (non-expectorated)     Status: None   Collection Time: 09/22/20  9:30 AM   Specimen: Tracheal Aspirate; Respiratory  Result Value Ref Range Status   Specimen Description TRACHEAL  ASPIRATE  Final   Special Requests NONE  Final   Gram Stain   Final    RARE WBC PRESENT,BOTH PMN AND MONONUCLEAR RARE GRAM POSITIVE COCCI IN PAIRS RARE GRAM VARIABLE ROD Performed at [facility], [place] [address] [place], [place] [address]    Culture   Final    MODERATE METHICILLIN RESISTANT  STAPHYLOCOCCUS AUREUS   Report Status 09/24/2020 FINAL  Final   Organism ID, Bacteria METHICILLIN RESISTANT STAPHYLOCOCCUS AUREUS  Final      Susceptibility   Methicillin resistant staphylococcus aureus - MIC*    CIPROFLOXACIN <=0.5 SENSITIVE Sensitive     ERYTHROMYCIN <=0.25 SENSITIVE Sensitive     GENTAMICIN <=0.5 SENSITIVE Sensitive     OXACILLIN RESISTANT Resistant     TETRACYCLINE <=1 SENSITIVE Sensitive     VANCOMYCIN 1 SENSITIVE Sensitive     TRIMETH/SULFA <=10 SENSITIVE Sensitive     CLINDAMYCIN 4 RESISTANT Resistant     RIFAMPIN <=0.5 SENSITIVE Sensitive     Inducible Clindamycin NEGATIVE Sensitive     * MODERATE METHICILLIN RESISTANT STAPHYLOCOCCUS AUREUS  Culture, blood (Routine X 2) w Reflex to ID Panel     Status: Abnormal   Collection Time: 09/22/20 10:45 AM   Specimen: BLOOD LEFT HAND  Result Value Ref Range Status   Specimen Description BLOOD LEFT HAND  Final   Special Requests   Final    BOTTLES DRAWN AEROBIC ONLY Blood Culture results may not be optimal due to an inadequate volume of blood received in culture bottles   Culture  Setup Time   Final    GRAM POSITIVE COCCI IN CLUSTERS AEROBIC BOTTLE ONLY CRITICAL RESULT CALLED TO, READ BACK BY AND VERIFIED WITH: PHARMD J MILLEN AT 0911 09/25/20 BY L BENFIELD    Culture (A)  Final    STAPHYLOCOCCUS EPIDERMIDIS THE SIGNIFICANCE OF ISOLATING THIS ORGANISM FROM A SINGLE SET OF BLOOD CULTURES WHEN MULTIPLE SETS ARE DRAWN IS UNCERTAIN. PLEASE NOTIFY THE MICROBIOLOGY DEPARTMENT WITHIN ONE WEEK IF SPECIATION AND SENSITIVITIES ARE REQUIRED. Performed at [facility], [place] [address] [place], [place] [address]    Report  Status 09/26/2020 FINAL  Final  Blood Culture ID Panel (Reflexed)     Status: Abnormal   Collection Time: 09/22/20 10:45 AM  Result Value Ref Range Status   Enterococcus faecalis NOT DETECTED NOT DETECTED Final   Enterococcus Faecium NOT DETECTED NOT DETECTED Final   Listeria monocytogenes NOT DETECTED NOT DETECTED Final   Staphylococcus species DETECTED (A) NOT DETECTED Final    Comment: CRITICAL RESULT CALLED TO, READ BACK BY AND VERIFIED WITH: PHARMD J MILLEN AT 0911 09/25/20 BY L BENFIELD    Staphylococcus aureus (BCID) NOT DETECTED NOT DETECTED Final   Staphylococcus epidermidis DETECTED (A) NOT DETECTED Final    Comment: Methicillin (oxacillin) resistant coagulase negative staphylococcus. Possible blood culture contaminant (unless isolated from more than one blood culture draw or clinical case suggests pathogenicity). No antibiotic treatment is indicated for blood  culture contaminants. CRITICAL RESULT CALLED TO, READ BACK BY AND VERIFIED WITH: PHARMD J MILLEN AT 0911 09/25/20 BY L BENFIELD    Staphylococcus lugdunensis NOT DETECTED NOT DETECTED Final   Streptococcus species NOT DETECTED NOT DETECTED Final   Streptococcus agalactiae NOT DETECTED NOT DETECTED Final   Streptococcus pneumoniae NOT DETECTED NOT DETECTED Final   Streptococcus pyogenes NOT DETECTED NOT DETECTED Final   A.calcoaceticus-baumannii NOT DETECTED NOT DETECTED Final   Bacteroides fragilis NOT DETECTED NOT DETECTED Final   Enterobacterales NOT DETECTED NOT DETECTED Final   Enterobacter cloacae complex NOT DETECTED NOT DETECTED Final   Escherichia coli NOT DETECTED NOT DETECTED Final   Klebsiella aerogenes NOT DETECTED NOT DETECTED Final   Klebsiella oxytoca NOT DETECTED NOT DETECTED Final   Klebsiella pneumoniae NOT DETECTED NOT DETECTED Final   Proteus species NOT DETECTED NOT DETECTED Final   Salmonella species NOT  DETECTED NOT DETECTED Final   Serratia marcescens NOT DETECTED NOT DETECTED Final    Haemophilus influenzae NOT DETECTED NOT DETECTED Final   Neisseria meningitidis NOT DETECTED NOT DETECTED Final   Pseudomonas aeruginosa NOT DETECTED NOT DETECTED Final   Stenotrophomonas maltophilia NOT DETECTED NOT DETECTED Final   Candida albicans NOT DETECTED NOT DETECTED Final   Candida auris NOT DETECTED NOT DETECTED Final   Candida glabrata NOT DETECTED NOT DETECTED Final   Candida krusei NOT DETECTED NOT DETECTED Final   Candida parapsilosis NOT DETECTED NOT DETECTED Final   Candida tropicalis NOT DETECTED NOT DETECTED Final   Cryptococcus neoformans/gattii NOT DETECTED NOT DETECTED Final   Methicillin resistance mecA/C DETECTED (A) NOT DETECTED Final    Comment: CRITICAL RESULT CALLED TO, READ BACK BY AND VERIFIED WITH: PHARMD J MILLEN AT 0911 09/25/20 BY L BENFIELD Performed at [facility], [place] [address], [place], [place] [address]   Culture, blood (Routine X 2) w Reflex to ID Panel     Status: None   Collection Time: 09/22/20 10:52 AM   Specimen: BLOOD LEFT HAND  Result Value Ref Range Status   Specimen Description BLOOD LEFT HAND  Final   Special Requests   Final    BOTTLES DRAWN AEROBIC ONLY Blood Culture results may not be optimal due to an inadequate volume of blood received in culture bottles   Culture   Final    NO GROWTH 5 DAYS Performed at [facility], [place] [address], [place], [place] [address]    Report Status 09/27/2020 FINAL  Final  Culture, blood (Routine X 2) w Reflex to ID Panel     Status: Abnormal   Collection Time: 09/29/20  5:51 PM   Specimen: BLOOD LEFT HAND  Result Value Ref Range Status   Specimen Description BLOOD LEFT HAND  Final   Special Requests   Final    BOTTLES DRAWN AEROBIC ONLY Blood Culture results may not be optimal due to an inadequate volume of blood received in culture bottles   Culture  Setup Time   Final    GRAM POSITIVE COCCI IN CLUSTERS AEROBIC BOTTLE ONLY Organism ID to follow CRITICAL RESULT CALLED TO, READ  BACK BY AND VERIFIED WITH: A. [facility] PharmD 11:45 10/01/20 ([email or web address])    Culture (A)  Final    STAPHYLOCOCCUS EPIDERMIDIS THE SIGNIFICANCE OF ISOLATING THIS ORGANISM FROM A SINGLE SET OF BLOOD CULTURES WHEN MULTIPLE SETS ARE DRAWN IS UNCERTAIN. PLEASE NOTIFY THE MICROBIOLOGY DEPARTMENT WITHIN ONE WEEK IF SPECIATION AND SENSITIVITIES ARE REQUIRED. Performed at [facility], [place] [address] [place], [place] [address]    Report Status 10/03/2020 FINAL  Final  Blood Culture ID Panel (Reflexed)     Status: Abnormal   Collection Time: 09/29/20  5:51 PM  Result Value Ref Range Status   Enterococcus faecalis NOT DETECTED NOT DETECTED Final   Enterococcus Faecium NOT DETECTED NOT DETECTED Final   Listeria monocytogenes NOT DETECTED NOT DETECTED Final   Staphylococcus species DETECTED (A) NOT DETECTED Final    Comment: CRITICAL RESULT CALLED TO, READ BACK BY AND VERIFIED WITH: A. [facility] PharmD 11:45 10/01/20 (wilsonm)    Staphylococcus aureus (BCID) NOT DETECTED NOT DETECTED Final   Staphylococcus epidermidis DETECTED (A) NOT DETECTED Final    Comment: Methicillin (oxacillin) resistant coagulase negative staphylococcus. Possible blood culture contaminant (unless isolated from more than one blood culture draw or clinical case suggests pathogenicity). No antibiotic treatment is indicated for blood  culture contaminants. CRITICAL  RESULT CALLED TO, READ BACK BY AND VERIFIED WITH: A. [facility] PharmD 11:45 10/01/20 (wilsonm)    Staphylococcus lugdunensis NOT DETECTED NOT DETECTED Final   Streptococcus species NOT DETECTED NOT DETECTED Final   Streptococcus agalactiae NOT DETECTED NOT DETECTED Final   Streptococcus pneumoniae NOT DETECTED NOT DETECTED Final   Streptococcus pyogenes NOT DETECTED NOT DETECTED Final   A.calcoaceticus-baumannii NOT DETECTED NOT DETECTED Final   Bacteroides fragilis NOT DETECTED NOT DETECTED Final   Enterobacterales NOT DETECTED NOT DETECTED Final   Enterobacter cloacae  complex NOT DETECTED NOT DETECTED Final   Escherichia coli NOT DETECTED NOT DETECTED Final   Klebsiella aerogenes NOT DETECTED NOT DETECTED Final   Klebsiella oxytoca NOT DETECTED NOT DETECTED Final   Klebsiella pneumoniae NOT DETECTED NOT DETECTED Final   Proteus species NOT DETECTED NOT DETECTED Final   Salmonella species NOT DETECTED NOT DETECTED Final   Serratia marcescens NOT DETECTED NOT DETECTED Final   Haemophilus influenzae NOT DETECTED NOT DETECTED Final   Neisseria meningitidis NOT DETECTED NOT DETECTED Final   Pseudomonas aeruginosa NOT DETECTED NOT DETECTED Final   Stenotrophomonas maltophilia NOT DETECTED NOT DETECTED Final   Candida albicans NOT DETECTED NOT DETECTED Final   Candida auris NOT DETECTED NOT DETECTED Final   Candida glabrata NOT DETECTED NOT DETECTED Final   Candida krusei NOT DETECTED NOT DETECTED Final   Candida parapsilosis NOT DETECTED NOT DETECTED Final   Candida tropicalis NOT DETECTED NOT DETECTED Final   Cryptococcus neoformans/gattii NOT DETECTED NOT DETECTED Final   Methicillin resistance mecA/C DETECTED (A) NOT DETECTED Final    Comment: CRITICAL RESULT CALLED TO, READ BACK BY AND VERIFIED WITH: A. [facility] PharmD 11:45 10/01/20 (wilsonm) Performed at [facility], [place] [address] [place], [place] [address]   Culture, blood (Routine X 2) w Reflex to ID Panel     Status: None   Collection Time: 09/29/20  6:22 PM   Specimen: BLOOD  Result Value Ref Range Status   Specimen Description BLOOD LEFT FOOT  Final   Special Requests   Final    BOTTLES DRAWN AEROBIC ONLY Blood Culture results may not be optimal due to an inadequate volume of blood received in culture bottles   Culture   Final    NO GROWTH 5 DAYS Performed at [facility], [place] [address], [place], [place] [address]    Report Status 10/04/2020 FINAL  Final  Culture, blood (routine x 2)     Status: None   Collection Time: 10/10/20 11:11 AM   Specimen: BLOOD  Result Value Ref  Range Status   Specimen Description BLOOD BLOOD LEFT HAND  Final   Special Requests   Final    BOTTLES DRAWN AEROBIC AND ANAEROBIC Blood Culture adequate volume   Culture   Final    NO GROWTH 5 DAYS Performed at [facility], [place] [address], [place], [place] [address]    Report Status 10/15/2020 FINAL  Final  Culture, blood (routine x 2)     Status: None   Collection Time: 10/10/20 11:11 AM   Specimen: BLOOD  Result Value Ref Range Status   Specimen Description BLOOD LEFT ANTECUBITAL  Final   Special Requests   Final    BOTTLES DRAWN AEROBIC ONLY Blood Culture results may not be optimal due to an inadequate volume of blood received in culture bottles   Culture   Final    NO GROWTH 5 DAYS Performed at [facility], [place]  [address] [place], [place] [address]    Report Status 10/15/2020 FINAL  Final  Culture, respiratory (non-expectorated)     Status: None   Collection Time: 10/22/20  6:14 PM   Specimen: Tracheal Aspirate; Respiratory  Result Value Ref Range Status   Specimen Description TRACHEAL ASPIRATE  Final   Special Requests NONE  Final   Gram Stain   Final    ABUNDANT WBC PRESENT, PREDOMINANTLY PMN ABUNDANT GRAM POSITIVE COCCI Performed at [facility], [place] [address], [place], [place] [address]    Culture FEW STAPHYLOCOCCUS AUREUS  Final   Report Status 10/25/2020 FINAL  Final   Organism ID, Bacteria STAPHYLOCOCCUS AUREUS  Final      Susceptibility   Staphylococcus aureus - MIC*    CIPROFLOXACIN <=0.5 SENSITIVE Sensitive     ERYTHROMYCIN <=0.25 SENSITIVE Sensitive     GENTAMICIN <=0.5 SENSITIVE Sensitive     OXACILLIN <=0.25 SENSITIVE Sensitive     TETRACYCLINE <=1 SENSITIVE Sensitive     VANCOMYCIN 1 SENSITIVE Sensitive     TRIMETH/SULFA <=10 SENSITIVE Sensitive     CLINDAMYCIN <=0.25 SENSITIVE Sensitive     RIFAMPIN <=0.5 SENSITIVE Sensitive     Inducible Clindamycin NEGATIVE Sensitive     * FEW STAPHYLOCOCCUS AUREUS  Culture, blood (Routine X  2) w Reflex to ID Panel     Status: None   Collection Time: 10/23/20  2:53 AM   Specimen: BLOOD LEFT HAND  Result Value Ref Range Status   Specimen Description BLOOD LEFT HAND  Final   Special Requests   Final    BOTTLES DRAWN AEROBIC AND ANAEROBIC Blood Culture results may not be optimal due to an inadequate volume of blood received in culture bottles   Culture   Final    NO GROWTH 5 DAYS Performed at [facility], [place] [address], [place], [place] [address]    Report Status 10/28/2020 FINAL  Final  Culture, blood (Routine X 2) w Reflex to ID Panel     Status: None   Collection Time: 10/23/20  3:01 AM   Specimen: BLOOD LEFT FOREARM  Result Value Ref Range Status   Specimen Description BLOOD LEFT FOREARM  Final   Special Requests   Final    AEROBIC BOTTLE ONLY Blood Culture results may not be optimal due to an inadequate volume of blood received in culture bottles   Culture   Final    NO GROWTH 5 DAYS Performed at [facility], [place] [address] [place], [place] [address]    Report Status 10/28/2020 FINAL  Final  Culture, blood (Routine X 2) w Reflex to ID Panel     Status: None   Collection Time: 10/31/20 10:13 AM   Specimen: BLOOD  Result Value Ref Range Status   Specimen Description BLOOD PICC LINE  Final   Special Requests   Final    BOTTLES DRAWN AEROBIC AND ANAEROBIC Blood Culture adequate volume   Culture   Final    NO GROWTH 6 DAYS Performed at [facility], [place] [address] [place], [place] [address]    Report Status 11/06/2020 FINAL  Final  Culture, blood (Routine X 2) w Reflex to ID Panel     Status: None   Collection Time: 10/31/20 10:18 AM   Specimen: BLOOD LEFT HAND  Result Value Ref Range Status   Specimen Description BLOOD LEFT HAND  Final   Special Requests   Final    BOTTLES DRAWN AEROBIC ONLY Blood Culture  results may not be optimal due to an inadequate volume of blood received in culture bottles   Culture   Final    NO GROWTH 6  DAYS Performed at [facility], [place] [address], [place] [address]    Report Status 11/06/2020 FINAL  Final  Culture, respiratory (non-expectorated)     Status: None   Collection Time: 11/02/20 10:11 AM   Specimen: Tracheal Aspirate; Respiratory  Result Value Ref Range Status   Specimen Description TRACHEAL ASPIRATE  Final   Special Requests NONE  Final   Gram Stain   Final    MODERATE WBC PRESENT,BOTH PMN AND MONONUCLEAR RARE GRAM POSITIVE COCCI IN PAIRS Performed at [facility], [place] [address] [place], [place] [address]    Culture RARE STAPHYLOCOCCUS AUREUS  Final   Report Status 11/05/2020 FINAL  Final   Organism ID, Bacteria STAPHYLOCOCCUS AUREUS  Final      Susceptibility   Staphylococcus aureus - MIC*    CIPROFLOXACIN <=0.5 SENSITIVE Sensitive     ERYTHROMYCIN <=0.25 SENSITIVE Sensitive     GENTAMICIN <=0.5 SENSITIVE Sensitive     OXACILLIN <=0.25 SENSITIVE Sensitive     TETRACYCLINE <=1 SENSITIVE Sensitive     VANCOMYCIN 1 SENSITIVE Sensitive     TRIMETH/SULFA <=10 SENSITIVE Sensitive     CLINDAMYCIN <=0.25 SENSITIVE Sensitive     RIFAMPIN <=0.5 SENSITIVE Sensitive     Inducible Clindamycin NEGATIVE Sensitive     * RARE STAPHYLOCOCCUS AUREUS  Culture, respiratory (non-expectorated)     Status: None   Collection Time: 11/22/20  4:45 PM   Specimen: Tracheal Aspirate; Respiratory  Result Value Ref Range Status   Specimen Description TRACHEAL ASPIRATE  Final   Special Requests NONE  Final   Gram Stain   Final    ABUNDANT WBC PRESENT,BOTH PMN AND MONONUCLEAR ABUNDANT GRAM POSITIVE COCCI IN PAIRS Performed at [facility], [place] [address], [place], [place] [address]    Culture MODERATE STAPHYLOCOCCUS AUREUS  Final   Report Status 11/25/2020 FINAL  Final   Organism ID, Bacteria STAPHYLOCOCCUS AUREUS  Final      Susceptibility   Staphylococcus aureus - MIC*    CIPROFLOXACIN <=0.5 SENSITIVE Sensitive     ERYTHROMYCIN <=0.25 SENSITIVE Sensitive      GENTAMICIN <=0.5 SENSITIVE Sensitive     OXACILLIN <=0.25 SENSITIVE Sensitive     TETRACYCLINE <=1 SENSITIVE Sensitive     VANCOMYCIN <=0.5 SENSITIVE Sensitive     TRIMETH/SULFA <=10 SENSITIVE Sensitive     CLINDAMYCIN <=0.25 SENSITIVE Sensitive     RIFAMPIN <=0.5 SENSITIVE Sensitive     Inducible Clindamycin NEGATIVE Sensitive     * MODERATE STAPHYLOCOCCUS AUREUS  Culture, blood (routine x 2)     Status: Abnormal   Collection Time: 12/05/20 11:07 AM   Specimen: BLOOD RIGHT ARM  Result Value Ref Range Status   Specimen Description BLOOD RIGHT ARM  Final   Special Requests   Final    BOTTLES DRAWN AEROBIC ONLY Blood Culture adequate volume   Culture  Setup Time   Final    GRAM POSITIVE COCCI IN CLUSTERS AEROBIC BOTTLE ONLY CRITICAL RESULT CALLED TO, READ BACK BY AND VERIFIED WITH: Tillman Sers PHARMD 2000 12/06/20 A BROWNING    Culture (A)  Final    STAPHYLOCOCCUS EPIDERMIDIS THE SIGNIFICANCE OF ISOLATING THIS ORGANISM FROM A SINGLE SET OF BLOOD CULTURES WHEN MULTIPLE SETS ARE DRAWN IS UNCERTAIN. PLEASE NOTIFY THE MICROBIOLOGY DEPARTMENT WITHIN ONE WEEK IF SPECIATION AND  SENSITIVITIES ARE REQUIRED. Performed at [facility], [place] [address], [place], [place] [address]    Report Status 12/08/2020 FINAL  Final  Culture, blood (routine x 2)     Status: None   Collection Time: 12/05/20 11:07 AM   Specimen: BLOOD LEFT HAND  Result Value Ref Range Status   Specimen Description BLOOD LEFT HAND  Final   Special Requests   Final    BOTTLES DRAWN AEROBIC ONLY Blood Culture adequate volume   Culture   Final    NO GROWTH 5 DAYS Performed at [facility], [place] [address], [place], [place] [address]    Report Status 12/10/2020 FINAL  Final  Blood Culture ID Panel (Reflexed)     Status: Abnormal   Collection Time: 12/05/20 11:07 AM  Result Value Ref Range Status   Enterococcus faecalis NOT DETECTED NOT DETECTED Final   Enterococcus Faecium NOT DETECTED NOT DETECTED Final    Listeria monocytogenes NOT DETECTED NOT DETECTED Final   Staphylococcus species DETECTED (A) NOT DETECTED Final    Comment: CRITICAL RESULT CALLED TO, READ BACK BY AND VERIFIED WITH: Tillman Sers PHARMD 2000 12/06/20 A BROWNING    Staphylococcus aureus (BCID) NOT DETECTED NOT DETECTED Final   Staphylococcus epidermidis DETECTED (A) NOT DETECTED Final    Comment: Methicillin (oxacillin) resistant coagulase negative staphylococcus. Possible blood culture contaminant (unless isolated from more than one blood culture draw or clinical case suggests pathogenicity). No antibiotic treatment is indicated for blood  culture contaminants. CRITICAL RESULT CALLED TO, READ BACK BY AND VERIFIED WITH: Tillman Sers PHARMD 2000 12/06/20 A BROWNING    Staphylococcus lugdunensis NOT DETECTED NOT DETECTED Final   Streptococcus species NOT DETECTED NOT DETECTED Final   Streptococcus agalactiae NOT DETECTED NOT DETECTED Final   Streptococcus pneumoniae NOT DETECTED NOT DETECTED Final   Streptococcus pyogenes NOT DETECTED NOT DETECTED Final   A.calcoaceticus-baumannii NOT DETECTED NOT DETECTED Final   Bacteroides fragilis NOT DETECTED NOT DETECTED Final   Enterobacterales NOT DETECTED NOT DETECTED Final   Enterobacter cloacae complex NOT DETECTED NOT DETECTED Final   Escherichia coli NOT DETECTED NOT DETECTED Final   Klebsiella aerogenes NOT DETECTED NOT DETECTED Final   Klebsiella oxytoca NOT DETECTED NOT DETECTED Final   Klebsiella pneumoniae NOT DETECTED NOT DETECTED Final   Proteus species NOT DETECTED NOT DETECTED Final   Salmonella species NOT DETECTED NOT DETECTED Final   Serratia marcescens NOT DETECTED NOT DETECTED Final   Haemophilus influenzae NOT DETECTED NOT DETECTED Final   Neisseria meningitidis NOT DETECTED NOT DETECTED Final   Pseudomonas aeruginosa NOT DETECTED NOT DETECTED Final   Stenotrophomonas maltophilia NOT DETECTED NOT DETECTED Final   Candida albicans NOT DETECTED NOT DETECTED Final   Candida  auris NOT DETECTED NOT DETECTED Final   Candida glabrata NOT DETECTED NOT DETECTED Final   Candida krusei NOT DETECTED NOT DETECTED Final   Candida parapsilosis NOT DETECTED NOT DETECTED Final   Candida tropicalis NOT DETECTED NOT DETECTED Final   Cryptococcus neoformans/gattii NOT DETECTED NOT DETECTED Final   Methicillin resistance mecA/C DETECTED (A) NOT DETECTED Final    Comment: CRITICAL RESULT CALLED TO, READ BACK BY AND VERIFIED WITH: Tillman Sers PHARMD 2000 12/06/20 A BROWNING Performed at [facility], [address] [place], [place] [address]     Coagulation Studies: No results for input(s): LABPROT, INR in the last 72 hours.  Urinalysis: No results for input(s): COLORURINE, LABSPEC, PHURINE, GLUCOSEU, HGBUR, BILIRUBINUR, KETONESUR, PROTEINUR, UROBILINOGEN, NITRITE, LEUKOCYTESUR in the  last 72 hours.  Invalid input(s): APPERANCEUR    Imaging: No results found.   Medications:   . albumin human 25 g (11/16/20 1516)   . acetaminophen (TYLENOL) oral liquid 160 mg/5 mL  650 mg Oral Q6H  . apixaban  2.5 mg Oral BID  . carbamide peroxide  10 drop Both EARS BID  . chlorhexidine gluconate (MEDLINE KIT)  15 mL Mouth Rinse BID  . Chlorhexidine Gluconate Cloth  6 each Topical Q0600  . cholecalciferol  2,000 Units Oral Daily  . collagenase   Topical BID  . darbepoetin (ARANESP) injection - DIALYSIS  100 mcg Intravenous Q Sat-HD  . diclofenac Sodium  2 g Topical QID  . feeding supplement (NEPRO CARB STEADY)  237 mL Oral TID BM  . feeding supplement (NEPRO CARB STEADY)  780 mL Per Tube Q24H  . guaiFENesin  15 mL Oral Q12H  . insulin aspart  0-6 Units Subcutaneous TID WC  . mouth rinse  15 mL Mouth Rinse QID  . megestrol  200 mg Oral QID  . metoprolol tartrate  37.5 mg Oral BID  . midodrine  40 mg Oral 3 times per day on Tue Thu Sat  . multivitamin  1 tablet Oral QHS  . nutrition supplement (JUVEN)  1 packet Per Tube BID BM  . nystatin   Topical BID  . pantoprazole  sodium  40 mg Oral Daily  . sodium chloride flush  10-40 mL Intracatheter Q12H   albumin human, bacitracin, docusate, Gerhardt's butt cream, guaiFENesin-dextromethorphan, hydrOXYzine, levalbuterol, ondansetron (ZOFRAN) IV, oxyCODONE **OR** oxyCODONE, phenol, polyethylene glycol, Resource ThickenUp Clear, silver nitrate applicators, sodium chloride, sodium chloride flush, sodium chloride HYPERTONIC  Assessment/ Plan:  #1.End-stage renal disease from prolonged dialysis dependent acute kidney Injury:Suspected to be multifactorial acute kidney injury from septic shock/MRSA pneumonia and hemorrhagic shock at the outside. Has been on renal replacement therapy since 09/08/2020 and recently able to transition to intermittent hemodialysis from CRRT. Intermittent hemodialysis has been challenging due to hypotension and positional discomfort from decubitus ulcer.Creatinine remains misleadingly low and is indicative of significant lean muscle mass loss with prolonged hospitalization. She is not a candidate for CIR and family have no desire to have her admitted to SNF or LTACand intend to take her home upon discharge.Continue TTS dialysis while here. Last HD on 2/12 with 1000 cc UF.  Seems like urine output is picking up therefore plan to monitor labs next few days, watch for renal recovery.  I do not see any urgent indication for dialysis however labs are pending from 12/14/2020.  We need to make sure that daily labs are ordered  2.Hypokalemia: Dialysis with high potassium bath, encourage oral intake and monitor lab.  .  No need to restrict potassium in her diet.  3.History of Covid pneumonia with ARDS: Status post tracheostomy and respiratory status appears to be stable with supplementation via T-bar.  4.Atrial fibrillation with rapid ventricular response: on metoprolol, tachycardic. On anticoagulation with Eliquis.  5.Severe protein calorie malnutrition with large sacral decubitus ulcer: Ongoing  nutritional supplementation with wound care.  6CKD-MBD: Calcium and phosphorus level acceptable.  Continue monitor.  7Anemia: Complicated by GI bleed from perforated duodenal ulcer status post exploratory laparotomy with Phillip Heal patch placement.   Received IV iron and continue ESA.  Monitor hemoglobin.    LOS: [place] _0 _1 :58 AM

## 2020-12-15 DIAGNOSIS — I4891 Unspecified atrial fibrillation: Secondary | ICD-10-CM | POA: Diagnosis not present

## 2020-12-15 LAB — RENAL FUNCTION PANEL
Albumin: 2.6 g/dL — ABNORMAL LOW (ref 3.5–5.0)
Anion gap: 10 (ref 5–15)
BUN: 76 mg/dL — ABNORMAL HIGH (ref 8–23)
CO2: 20 mmol/L — ABNORMAL LOW (ref 22–32)
Calcium: 11.5 mg/dL — ABNORMAL HIGH (ref 8.9–10.3)
Chloride: 104 mmol/L (ref 98–111)
Creatinine, Ser: 0.77 mg/dL (ref 0.44–1.00)
GFR, Estimated: >60 mL/min (ref >60)
Glucose, Bld: 197 mg/dL — ABNORMAL HIGH (ref 70–99)
Phosphorus: 2.9 mg/dL (ref 2.5–4.6)
Potassium: 4.8 mmol/L (ref 3.5–5.1)
Sodium: 134 mmol/L — ABNORMAL LOW (ref 135–145)

## 2020-12-15 LAB — GLUCOSE, CAPILLARY
Glucose-Capillary: 245 mg/dL — ABNORMAL HIGH (ref 70–99)
Glucose-Capillary: 186 mg/dL — ABNORMAL HIGH (ref 70–99)
Glucose-Capillary: 135 mg/dL — ABNORMAL HIGH (ref 70–99)
Glucose-Capillary: 210 mg/dL — ABNORMAL HIGH (ref 70–99)
Glucose-Capillary: 255 mg/dL — ABNORMAL HIGH (ref 70–99)

## 2020-12-15 MED ORDER — SALINE SPRAY 0.65 % NA SOLN
2.0000 | Freq: Four times a day (QID) | NASAL | Status: DC
  Administered 2020-12-15 – 2020-12-23 (×19): 2 via NASAL
  Filled 2020-12-15: qty 44

## 2020-12-15 NOTE — Progress Notes (Signed)
Inpatient Diabetes Program Recommendations  AACE/ADA: New Consensus Statement on Inpatient Glycemic Control   Target Ranges:  Prepandial:   less than 140 mg/dL      Peak postprandial:   less than 180 mg/dL (1-2 hours)      Critically ill patients:  140 - 180 mg/dL   Results for Candace [name], Candace [name] (MRN [id number]) as of 12/15/2020 11:11  Ref. Range 12/14/2020 05:18 12/14/2020 07:36 12/14/2020 11:39 12/14/2020 16:28 12/14/2020 19:56 12/14/2020 23:07 12/15/2020 03:45 12/15/2020 07:34  Glucose-Capillary Latest Ref Range: 70 - 99 mg/dL 329 (H) 264 (H) 178 (H) 161 (H) 246 (H) 172 (H) 245 (H) 186 (H)   Review of Glycemic Control  Current orders for Inpatient glycemic control: Levemir 5 units BID, Novolog 0-6 units TID with meals, Novolog 0-5 units QHS; Nepro @ 65 ml/hr 8p-8a  Inpatient Diabetes Program Recommendations:    Insulin: Please consider changing Novolog frequency to 0-6 units Q4H which would allow correction of glucose during night due to hyperglycemia from nocturnal feedings.   Thanks, Barnie Alderman, RN, MSN, CDE Diabetes Coordinator Inpatient Diabetes Program [phone] (Team Pager from 8am to 5pm)

## 2020-12-15 NOTE — Progress Notes (Signed)
[name] ROUNDING NOTE   Subjective:   Interval History: This is a 70 year old lady with a history of recent Covid pneumonia obesity: Status post colon resection and colostomy bag diabetes mellitus and acute kidney injury requiring dialysis.  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  Blood pressure 125/77 pulse 133 temperature 98.5 O2 sats 100% room air.  Urine output 1 L 12/14/2020  Sodium 134 potassium 4.8 chloride 104 CO2 20 BUN 76 creatinine 0.77 glucose 197 calcium 11.5 phosphorus 2.9 albumin 2.6  Last dialysis treatment 12/11/2020 with removal of 1.4 L    Objective:  Vital signs in last 24 hours:  Temp:  [98.2 F (36.8 C)-99.1 F (37.3 C)] 98.9 F (37.2 C) (02/16 0348) Pulse Rate:  [83-108] 104 (02/16 0348) Resp:  [14-22] 19 (02/16 0348) BP: (106-125)/(68-93) 106/71 (02/16 0348) SpO2:  [91 %-100 %] 100 % (02/16 0348) FiO2 (%):  [21 %] 21 % (02/16 0743)  Weight change:  Filed Weights    Intake/Output: I/O last 3 completed shifts: In: 61 [P.O.:240; NG/GT:1175] Out: 4207 [Urine:2850; Emesis/NG output:457; Drains:500; Stool:400]   Intake/Output this shift:  No intake/output data recorded.  General:NAD, resting comfortably, has tracheostomy Heart:RRR, s1s2 nl Lungs:clear b/l, no crackle Abdomen:soft, Non-tender, non-distended Extremities: Trace dependent edema. Dialysis Access: Right IJ TDC in place. Neurology: Alert awake and following commands.   Basic Metabolic Panel: Recent Labs  Lab 12/09/20 0903 12/11/20 0643 12/12/20 1100 12/13/20 0119 12/14/20 0746 12/15/20 0101  NA 137 140 131* 132* 133* 134*  K 3.1* 2.6* 4.2 4.5 4.3 4.8  CL 102 113* 99 99 106 104  CO2 23 18* 20* 21* 17* 20*  GLUCOSE 163* 103* 220* 234* 281* 197*  BUN 63* 66* 76* 90* 79* 76*  CREATININE 1.00 0.64 1.05* 1.00 0.86 0.77  CALCIUM 10.7* 8.3* 10.2 10.0 10.7* 11.5*  MG  --  1.2*  --   --   --   --    PHOS 3.2  --  4.1 4.1 3.1 2.9    Liver Function Tests: Recent Labs  Lab 12/11/20 0643 12/12/20 1100 12/13/20 0119 12/14/20 0746 12/15/20 0101  AST 15  --   --   --   --   ALT 13  --   --   --   --   ALKPHOS 63  --   --   --   --   BILITOT 0.4  --   --   --   --   PROT 5.8*  --   --   --   --   ALBUMIN 1.8* 2.5* 2.4* 2.6* 2.6*   No results for input(s): LIPASE, AMYLASE in the last 168 hours. No results for input(s): AMMONIA in the last 168 hours.  CBC: Recent Labs  Lab 12/09/20 0903 12/11/20 0643  WBC 16.5* 15.7*  HGB 8.1* 7.9*  HCT 26.8* 25.1*  MCV 87.9 85.7  PLT 443* 283    Cardiac Enzymes: No results for input(s): CKTOTAL, CKMB, CKMBINDEX, TROPONINI in the last 168 hours.  BNP: Invalid input(s): POCBNP  CBG: Recent Labs  Lab 12/14/20 1628 12/14/20 1956 12/14/20 2307 12/15/20 0345 12/15/20 0734  GLUCAP 161* 246* 172* 245* 186*    Microbiology: Results for orders placed or performed during the hospital encounter of 08/31/20  MRSA PCR Screening     Status: None   Collection Time: 09/08/20  8:17 AM   Specimen: Nasal Mucosa; Nasopharyngeal  Result Value Ref Range  Status   MRSA by PCR NEGATIVE NEGATIVE Final    Comment:        The GeneXpert MRSA Assay (FDA approved for NASAL specimens only), is one component of a comprehensive MRSA colonization surveillance program. It is not intended to diagnose MRSA infection nor to guide or monitor treatment for MRSA infections. Performed at [facility], [place] [address], [place], [place] [address]   Culture, blood (Routine X 2) w Reflex to ID Panel     Status: None   Collection Time: 09/08/20  8:35 AM   Specimen: BLOOD LEFT HAND  Result Value Ref Range Status   Specimen Description BLOOD LEFT HAND  Final   Special Requests   Final    BOTTLES DRAWN AEROBIC ONLY Blood Culture results may not be optimal due to an inadequate volume of blood received in culture bottles   Culture   Final    NO GROWTH 5  DAYS Performed at [facility], [place] [address], [place], [place] [address]    Report Status 09/13/2020 FINAL  Final  Culture, blood (Routine X 2) w Reflex to ID Panel     Status: None   Collection Time: 09/08/20  8:35 AM   Specimen: BLOOD RIGHT HAND  Result Value Ref Range Status   Specimen Description BLOOD RIGHT HAND  Final   Special Requests   Final    BOTTLES DRAWN AEROBIC ONLY Blood Culture results may not be optimal due to an inadequate volume of blood received in culture bottles   Culture   Final    NO GROWTH 5 DAYS Performed at [facility], [place] [address] [place], [place] [address]    Report Status 09/13/2020 FINAL  Final  Culture, respiratory (non-expectorated)     Status: None   Collection Time: 09/16/20  8:35 AM   Specimen: Tracheal Aspirate; Respiratory  Result Value Ref Range Status   Specimen Description TRACHEAL ASPIRATE  Final   Special Requests NONE  Final   Gram Stain   Final    ABUNDANT WBC PRESENT, PREDOMINANTLY PMN RARE YEAST    Culture   Final    FEW Normal respiratory flora-no Staph aureus or Pseudomonas seen Performed at [facility], [address] [name], [place] [address]    Report Status 09/18/2020 FINAL  Final  Culture, respiratory (non-expectorated)     Status: None   Collection Time: 09/16/20 11:03 AM   Specimen: Bronchoalveolar Lavage; Respiratory  Result Value Ref Range Status   Specimen Description BRONCHIAL ALVEOLAR LAVAGE  Final   Special Requests NONE  Final   Gram Stain   Final    ABUNDANT WBC PRESENT, PREDOMINANTLY PMN RARE GRAM POSITIVE COCCI Performed at [facility], [place] [address], [place], [place] [address]    Culture FEW CANDIDA ALBICANS  Final   Report Status 09/18/2020 FINAL  Final  Culture, respiratory (non-expectorated)     Status: None   Collection Time: 09/22/20  9:30 AM   Specimen: Tracheal Aspirate; Respiratory  Result Value Ref Range Status   Specimen Description TRACHEAL ASPIRATE  Final    Special Requests NONE  Final   Gram Stain   Final    RARE WBC PRESENT,BOTH PMN AND MONONUCLEAR RARE GRAM POSITIVE COCCI IN PAIRS RARE GRAM VARIABLE ROD Performed at [facility], [place] [address] [place], [place] [address]    Culture   Final    MODERATE METHICILLIN RESISTANT STAPHYLOCOCCUS AUREUS   Report Status 09/24/2020  FINAL  Final   Organism ID, Bacteria METHICILLIN RESISTANT STAPHYLOCOCCUS AUREUS  Final      Susceptibility   Methicillin resistant staphylococcus aureus - MIC*    CIPROFLOXACIN <=0.5 SENSITIVE Sensitive     ERYTHROMYCIN <=0.25 SENSITIVE Sensitive     GENTAMICIN <=0.5 SENSITIVE Sensitive     OXACILLIN RESISTANT Resistant     TETRACYCLINE <=1 SENSITIVE Sensitive     VANCOMYCIN 1 SENSITIVE Sensitive     TRIMETH/SULFA <=10 SENSITIVE Sensitive     CLINDAMYCIN 4 RESISTANT Resistant     RIFAMPIN <=0.5 SENSITIVE Sensitive     Inducible Clindamycin NEGATIVE Sensitive     * MODERATE METHICILLIN RESISTANT STAPHYLOCOCCUS AUREUS  Culture, blood (Routine X 2) w Reflex to ID Panel     Status: Abnormal   Collection Time: 09/22/20 10:45 AM   Specimen: BLOOD LEFT HAND  Result Value Ref Range Status   Specimen Description BLOOD LEFT HAND  Final   Special Requests   Final    BOTTLES DRAWN AEROBIC ONLY Blood Culture results may not be optimal due to an inadequate volume of blood received in culture bottles   Culture  Setup Time   Final    GRAM POSITIVE COCCI IN CLUSTERS AEROBIC BOTTLE ONLY CRITICAL RESULT CALLED TO, READ BACK BY AND VERIFIED WITH: PHARMD J MILLEN AT 0911 09/25/20 BY L BENFIELD    Culture (A)  Final    STAPHYLOCOCCUS EPIDERMIDIS THE SIGNIFICANCE OF ISOLATING THIS ORGANISM FROM A SINGLE SET OF BLOOD CULTURES WHEN MULTIPLE SETS ARE DRAWN IS UNCERTAIN. PLEASE NOTIFY THE MICROBIOLOGY DEPARTMENT WITHIN ONE WEEK IF SPECIATION AND SENSITIVITIES ARE REQUIRED. Performed at [facility], [place] [address], [place], [place] [address]    Report Status 09/26/2020  FINAL  Final  Blood Culture ID Panel (Reflexed)     Status: Abnormal   Collection Time: 09/22/20 10:45 AM  Result Value Ref Range Status   Enterococcus faecalis NOT DETECTED NOT DETECTED Final   Enterococcus Faecium NOT DETECTED NOT DETECTED Final   Listeria monocytogenes NOT DETECTED NOT DETECTED Final   Staphylococcus species DETECTED (A) NOT DETECTED Final    Comment: CRITICAL RESULT CALLED TO, READ BACK BY AND VERIFIED WITH: PHARMD J MILLEN AT 0911 09/25/20 BY L BENFIELD    Staphylococcus aureus (BCID) NOT DETECTED NOT DETECTED Final   Staphylococcus epidermidis DETECTED (A) NOT DETECTED Final    Comment: Methicillin (oxacillin) resistant coagulase negative staphylococcus. Possible blood culture contaminant (unless isolated from more than one blood culture draw or clinical case suggests pathogenicity). No antibiotic treatment is indicated for blood  culture contaminants. CRITICAL RESULT CALLED TO, READ BACK BY AND VERIFIED WITH: PHARMD J MILLEN AT 0911 09/25/20 BY L BENFIELD    Staphylococcus lugdunensis NOT DETECTED NOT DETECTED Final   Streptococcus species NOT DETECTED NOT DETECTED Final   Streptococcus agalactiae NOT DETECTED NOT DETECTED Final   Streptococcus pneumoniae NOT DETECTED NOT DETECTED Final   Streptococcus pyogenes NOT DETECTED NOT DETECTED Final   A.calcoaceticus-baumannii NOT DETECTED NOT DETECTED Final   Bacteroides fragilis NOT DETECTED NOT DETECTED Final   Enterobacterales NOT DETECTED NOT DETECTED Final   Enterobacter cloacae complex NOT DETECTED NOT DETECTED Final   Escherichia coli NOT DETECTED NOT DETECTED Final   Klebsiella aerogenes NOT DETECTED NOT DETECTED Final   Klebsiella oxytoca NOT DETECTED NOT DETECTED Final   Klebsiella pneumoniae NOT DETECTED NOT DETECTED Final   Proteus species NOT DETECTED NOT DETECTED Final   Salmonella species NOT DETECTED NOT DETECTED Final   Serratia  marcescens NOT DETECTED NOT DETECTED Final   Haemophilus influenzae  NOT DETECTED NOT DETECTED Final   Neisseria meningitidis NOT DETECTED NOT DETECTED Final   Pseudomonas aeruginosa NOT DETECTED NOT DETECTED Final   Stenotrophomonas maltophilia NOT DETECTED NOT DETECTED Final   Candida albicans NOT DETECTED NOT DETECTED Final   Candida auris NOT DETECTED NOT DETECTED Final   Candida glabrata NOT DETECTED NOT DETECTED Final   Candida krusei NOT DETECTED NOT DETECTED Final   Candida parapsilosis NOT DETECTED NOT DETECTED Final   Candida tropicalis NOT DETECTED NOT DETECTED Final   Cryptococcus neoformans/gattii NOT DETECTED NOT DETECTED Final   Methicillin resistance mecA/C DETECTED (A) NOT DETECTED Final    Comment: CRITICAL RESULT CALLED TO, READ BACK BY AND VERIFIED WITH: PHARMD J MILLEN AT 0911 09/25/20 BY L BENFIELD Performed at [facility], [place] [address] [place], [place] [address]   Culture, blood (Routine X 2) w Reflex to ID Panel     Status: None   Collection Time: 09/22/20 10:52 AM   Specimen: BLOOD LEFT HAND  Result Value Ref Range Status   Specimen Description BLOOD LEFT HAND  Final   Special Requests   Final    BOTTLES DRAWN AEROBIC ONLY Blood Culture results may not be optimal due to an inadequate volume of blood received in culture bottles   Culture   Final    NO GROWTH 5 DAYS Performed at [facility], [place] [address] [place], [place] [address]    Report Status 09/27/2020 FINAL  Final  Culture, blood (Routine X 2) w Reflex to ID Panel     Status: Abnormal   Collection Time: 09/29/20  5:51 PM   Specimen: BLOOD LEFT HAND  Result Value Ref Range Status   Specimen Description BLOOD LEFT HAND  Final   Special Requests   Final    BOTTLES DRAWN AEROBIC ONLY Blood Culture results may not be optimal due to an inadequate volume of blood received in culture bottles   Culture  Setup Time   Final    GRAM POSITIVE COCCI IN CLUSTERS AEROBIC BOTTLE ONLY Organism ID to follow CRITICAL RESULT CALLED TO, READ BACK BY AND VERIFIED WITH:  A. [facility] PharmD 11:45 10/01/20 ([email or web address])    Culture (A)  Final    STAPHYLOCOCCUS EPIDERMIDIS THE SIGNIFICANCE OF ISOLATING THIS ORGANISM FROM A SINGLE SET OF BLOOD CULTURES WHEN MULTIPLE SETS ARE DRAWN IS UNCERTAIN. PLEASE NOTIFY THE MICROBIOLOGY DEPARTMENT WITHIN ONE WEEK IF SPECIATION AND SENSITIVITIES ARE REQUIRED. Performed at [facility], [place] [address], [place], [place] [address]    Report Status 10/03/2020 FINAL  Final  Blood Culture ID Panel (Reflexed)     Status: Abnormal   Collection Time: 09/29/20  5:51 PM  Result Value Ref Range Status   Enterococcus faecalis NOT DETECTED NOT DETECTED Final   Enterococcus Faecium NOT DETECTED NOT DETECTED Final   Listeria monocytogenes NOT DETECTED NOT DETECTED Final   Staphylococcus species DETECTED (A) NOT DETECTED Final    Comment: CRITICAL RESULT CALLED TO, READ BACK BY AND VERIFIED WITH: A. [facility] PharmD 11:45 10/01/20 ([email or web address])    Staphylococcus aureus (BCID) NOT DETECTED NOT DETECTED Final   Staphylococcus epidermidis DETECTED (A) NOT DETECTED Final    Comment: Methicillin (oxacillin) resistant coagulase negative staphylococcus. Possible blood culture contaminant (unless isolated from more than one blood culture draw or clinical case suggests pathogenicity). No antibiotic treatment is indicated for blood  culture contaminants. CRITICAL RESULT CALLED TO, READ BACK BY AND  VERIFIED WITH: A. [facility] PharmD 11:45 10/01/20 ([email or web address])    Staphylococcus lugdunensis NOT DETECTED NOT DETECTED Final   Streptococcus species NOT DETECTED NOT DETECTED Final   Streptococcus agalactiae NOT DETECTED NOT DETECTED Final   Streptococcus pneumoniae NOT DETECTED NOT DETECTED Final   Streptococcus pyogenes NOT DETECTED NOT DETECTED Final   A.calcoaceticus-baumannii NOT DETECTED NOT DETECTED Final   Bacteroides fragilis NOT DETECTED NOT DETECTED Final   Enterobacterales NOT DETECTED NOT DETECTED Final   Enterobacter cloacae complex NOT DETECTED NOT  DETECTED Final   Escherichia coli NOT DETECTED NOT DETECTED Final   Klebsiella aerogenes NOT DETECTED NOT DETECTED Final   Klebsiella oxytoca NOT DETECTED NOT DETECTED Final   Klebsiella pneumoniae NOT DETECTED NOT DETECTED Final   Proteus species NOT DETECTED NOT DETECTED Final   Salmonella species NOT DETECTED NOT DETECTED Final   Serratia marcescens NOT DETECTED NOT DETECTED Final   Haemophilus influenzae NOT DETECTED NOT DETECTED Final   Neisseria meningitidis NOT DETECTED NOT DETECTED Final   Pseudomonas aeruginosa NOT DETECTED NOT DETECTED Final   Stenotrophomonas maltophilia NOT DETECTED NOT DETECTED Final   Candida albicans NOT DETECTED NOT DETECTED Final   Candida auris NOT DETECTED NOT DETECTED Final   Candida glabrata NOT DETECTED NOT DETECTED Final   Candida krusei NOT DETECTED NOT DETECTED Final   Candida parapsilosis NOT DETECTED NOT DETECTED Final   Candida tropicalis NOT DETECTED NOT DETECTED Final   Cryptococcus neoformans/gattii NOT DETECTED NOT DETECTED Final   Methicillin resistance mecA/C DETECTED (A) NOT DETECTED Final    Comment: CRITICAL RESULT CALLED TO, READ BACK BY AND VERIFIED WITH: A. [facility] PharmD 11:45 10/01/20 ([email or web address]) Performed at [facility], [place] [address], [place], [place] [address]   Culture, blood (Routine X 2) w Reflex to ID Panel     Status: None   Collection Time: 09/29/20  6:22 PM   Specimen: BLOOD  Result Value Ref Range Status   Specimen Description BLOOD LEFT FOOT  Final   Special Requests   Final    BOTTLES DRAWN AEROBIC ONLY Blood Culture results may not be optimal due to an inadequate volume of blood received in culture bottles   Culture   Final    NO GROWTH 5 DAYS Performed at [facility], [place] [address] [place], [place] [address]    Report Status 10/04/2020 FINAL  Final  Culture, blood (routine x 2)     Status: None   Collection Time: 10/10/20 11:11 AM   Specimen: BLOOD  Result Value Ref Range Status   Specimen  Description BLOOD BLOOD LEFT HAND  Final   Special Requests   Final    BOTTLES DRAWN AEROBIC AND ANAEROBIC Blood Culture adequate volume   Culture   Final    NO GROWTH 5 DAYS Performed at [facility], [place] [address], [place], [place] [address]    Report Status 10/15/2020 FINAL  Final  Culture, blood (routine x 2)     Status: None   Collection Time: 10/10/20 11:11 AM   Specimen: BLOOD  Result Value Ref Range Status   Specimen Description BLOOD LEFT ANTECUBITAL  Final   Special Requests   Final    BOTTLES DRAWN AEROBIC ONLY Blood Culture results may not be optimal due to an inadequate volume of blood received in culture bottles   Culture   Final    NO GROWTH 5 DAYS Performed at [facility], [place] [address], [place], [place] [address]  Report Status 10/15/2020 FINAL  Final  Culture, respiratory (non-expectorated)     Status: None   Collection Time: 10/22/20  6:14 PM   Specimen: Tracheal Aspirate; Respiratory  Result Value Ref Range Status   Specimen Description TRACHEAL ASPIRATE  Final   Special Requests NONE  Final   Gram Stain   Final    ABUNDANT WBC PRESENT, PREDOMINANTLY PMN ABUNDANT GRAM POSITIVE COCCI Performed at [facility], [place] [address], [place], [place] [address]    Culture FEW STAPHYLOCOCCUS AUREUS  Final   Report Status 10/25/2020 FINAL  Final   Organism ID, Bacteria STAPHYLOCOCCUS AUREUS  Final      Susceptibility   Staphylococcus aureus - MIC*    CIPROFLOXACIN <=0.5 SENSITIVE Sensitive     ERYTHROMYCIN <=0.25 SENSITIVE Sensitive     GENTAMICIN <=0.5 SENSITIVE Sensitive     OXACILLIN <=0.25 SENSITIVE Sensitive     TETRACYCLINE <=1 SENSITIVE Sensitive     VANCOMYCIN 1 SENSITIVE Sensitive     TRIMETH/SULFA <=10 SENSITIVE Sensitive     CLINDAMYCIN <=0.25 SENSITIVE Sensitive     RIFAMPIN <=0.5 SENSITIVE Sensitive     Inducible Clindamycin NEGATIVE Sensitive     * FEW STAPHYLOCOCCUS AUREUS  Culture, blood (Routine X 2) w Reflex to ID Panel      Status: None   Collection Time: 10/23/20  2:53 AM   Specimen: BLOOD LEFT HAND  Result Value Ref Range Status   Specimen Description BLOOD LEFT HAND  Final   Special Requests   Final    BOTTLES DRAWN AEROBIC AND ANAEROBIC Blood Culture results may not be optimal due to an inadequate volume of blood received in culture bottles   Culture   Final    NO GROWTH 5 DAYS Performed at [facility], [place] [address] [place], [place] [address]    Report Status 10/28/2020 FINAL  Final  Culture, blood (Routine X 2) w Reflex to ID Panel     Status: None   Collection Time: 10/23/20  3:01 AM   Specimen: BLOOD LEFT FOREARM  Result Value Ref Range Status   Specimen Description BLOOD LEFT FOREARM  Final   Special Requests   Final    AEROBIC BOTTLE ONLY Blood Culture results may not be optimal due to an inadequate volume of blood received in culture bottles   Culture   Final    NO GROWTH 5 DAYS Performed at [facility], [place] [address] [place], [place] [address]    Report Status 10/28/2020 FINAL  Final  Culture, blood (Routine X 2) w Reflex to ID Panel     Status: None   Collection Time: 10/31/20 10:13 AM   Specimen: BLOOD  Result Value Ref Range Status   Specimen Description BLOOD PICC LINE  Final   Special Requests   Final    BOTTLES DRAWN AEROBIC AND ANAEROBIC Blood Culture adequate volume   Culture   Final    NO GROWTH 6 DAYS Performed at [facility], [place] [address], [place], [place] [address]    Report Status 11/06/2020 FINAL  Final  Culture, blood (Routine X 2) w Reflex to ID Panel     Status: None   Collection Time: 10/31/20 10:18 AM   Specimen: BLOOD LEFT HAND  Result Value Ref Range Status   Specimen Description BLOOD LEFT HAND  Final   Special Requests   Final    BOTTLES DRAWN AEROBIC ONLY Blood Culture results may not be optimal due to  an inadequate volume of blood received in culture bottles   Culture   Final    NO GROWTH 6 DAYS Performed at [facility], [place] [address], [place], [place] [address]    Report Status 11/06/2020 FINAL  Final  Culture, respiratory (non-expectorated)     Status: None   Collection Time: 11/02/20 10:11 AM   Specimen: Tracheal Aspirate; Respiratory  Result Value Ref Range Status   Specimen Description TRACHEAL ASPIRATE  Final   Special Requests NONE  Final   Gram Stain   Final    MODERATE WBC PRESENT,BOTH PMN AND MONONUCLEAR RARE GRAM POSITIVE COCCI IN PAIRS Performed at [facility], [place] [address], [place], [place] [address]    Culture RARE STAPHYLOCOCCUS AUREUS  Final   Report Status 11/05/2020 FINAL  Final   Organism ID, Bacteria STAPHYLOCOCCUS AUREUS  Final      Susceptibility   Staphylococcus aureus - MIC*    CIPROFLOXACIN <=0.5 SENSITIVE Sensitive     ERYTHROMYCIN <=0.25 SENSITIVE Sensitive     GENTAMICIN <=0.5 SENSITIVE Sensitive     OXACILLIN <=0.25 SENSITIVE Sensitive     TETRACYCLINE <=1 SENSITIVE Sensitive     VANCOMYCIN 1 SENSITIVE Sensitive     TRIMETH/SULFA <=10 SENSITIVE Sensitive     CLINDAMYCIN <=0.25 SENSITIVE Sensitive     RIFAMPIN <=0.5 SENSITIVE Sensitive     Inducible Clindamycin NEGATIVE Sensitive     * RARE STAPHYLOCOCCUS AUREUS  Culture, respiratory (non-expectorated)     Status: None   Collection Time: 11/22/20  4:45 PM   Specimen: Tracheal Aspirate; Respiratory  Result Value Ref Range Status   Specimen Description TRACHEAL ASPIRATE  Final   Special Requests NONE  Final   Gram Stain   Final    ABUNDANT WBC PRESENT,BOTH PMN AND MONONUCLEAR ABUNDANT GRAM POSITIVE COCCI IN PAIRS Performed at [facility], [place] [address], [place], [place] [address]    Culture MODERATE STAPHYLOCOCCUS AUREUS  Final   Report Status 11/25/2020 FINAL  Final   Organism ID, Bacteria STAPHYLOCOCCUS AUREUS  Final      Susceptibility   Staphylococcus aureus - MIC*    CIPROFLOXACIN <=0.5 SENSITIVE Sensitive     ERYTHROMYCIN <=0.25 SENSITIVE Sensitive     GENTAMICIN <=0.5  SENSITIVE Sensitive     OXACILLIN <=0.25 SENSITIVE Sensitive     TETRACYCLINE <=1 SENSITIVE Sensitive     VANCOMYCIN <=0.5 SENSITIVE Sensitive     TRIMETH/SULFA <=10 SENSITIVE Sensitive     CLINDAMYCIN <=0.25 SENSITIVE Sensitive     RIFAMPIN <=0.5 SENSITIVE Sensitive     Inducible Clindamycin NEGATIVE Sensitive     * MODERATE STAPHYLOCOCCUS AUREUS  Culture, blood (routine x 2)     Status: Abnormal   Collection Time: 12/05/20 11:07 AM   Specimen: BLOOD RIGHT ARM  Result Value Ref Range Status   Specimen Description BLOOD RIGHT ARM  Final   Special Requests   Final    BOTTLES DRAWN AEROBIC ONLY Blood Culture adequate volume   Culture  Setup Time   Final    GRAM POSITIVE COCCI IN CLUSTERS AEROBIC BOTTLE ONLY CRITICAL RESULT CALLED TO, READ BACK BY AND VERIFIED WITH: Tillman Sers PHARMD 2000 12/06/20 A BROWNING    Culture (A)  Final    STAPHYLOCOCCUS EPIDERMIDIS THE SIGNIFICANCE OF ISOLATING THIS ORGANISM FROM A SINGLE SET OF BLOOD CULTURES WHEN MULTIPLE SETS ARE DRAWN IS UNCERTAIN. PLEASE NOTIFY THE MICROBIOLOGY DEPARTMENT WITHIN ONE WEEK IF SPECIATION AND SENSITIVITIES ARE REQUIRED. Performed at [facility], [place] [address] [place], [place] [address]    Report Status 12/08/2020 FINAL  Final  Culture, blood (routine x 2)     Status: None   Collection Time: 12/05/20 11:07 AM   Specimen: BLOOD LEFT HAND  Result Value Ref Range Status   Specimen Description BLOOD LEFT HAND  Final   Special Requests   Final    BOTTLES DRAWN AEROBIC ONLY Blood Culture adequate volume   Culture   Final    NO GROWTH 5 DAYS Performed at [facility], [place] [address], [place], [place] [address]    Report Status 12/10/2020 FINAL  Final  Blood Culture ID Panel (Reflexed)     Status: Abnormal   Collection Time: 12/05/20 11:07 AM  Result Value Ref Range Status   Enterococcus faecalis NOT DETECTED NOT DETECTED Final   Enterococcus Faecium NOT DETECTED NOT DETECTED Final   Listeria monocytogenes NOT  DETECTED NOT DETECTED Final   Staphylococcus species DETECTED (A) NOT DETECTED Final    Comment: CRITICAL RESULT CALLED TO, READ BACK BY AND VERIFIED WITH: Tillman Sers PHARMD 2000 12/06/20 A BROWNING    Staphylococcus aureus (BCID) NOT DETECTED NOT DETECTED Final   Staphylococcus epidermidis DETECTED (A) NOT DETECTED Final    Comment: Methicillin (oxacillin) resistant coagulase negative staphylococcus. Possible blood culture contaminant (unless isolated from more than one blood culture draw or clinical case suggests pathogenicity). No antibiotic treatment is indicated for blood  culture contaminants. CRITICAL RESULT CALLED TO, READ BACK BY AND VERIFIED WITH: Tillman Sers PHARMD 2000 12/06/20 A BROWNING    Staphylococcus lugdunensis NOT DETECTED NOT DETECTED Final   Streptococcus species NOT DETECTED NOT DETECTED Final   Streptococcus agalactiae NOT DETECTED NOT DETECTED Final   Streptococcus pneumoniae NOT DETECTED NOT DETECTED Final   Streptococcus pyogenes NOT DETECTED NOT DETECTED Final   A.calcoaceticus-baumannii NOT DETECTED NOT DETECTED Final   Bacteroides fragilis NOT DETECTED NOT DETECTED Final   Enterobacterales NOT DETECTED NOT DETECTED Final   Enterobacter cloacae complex NOT DETECTED NOT DETECTED Final   Escherichia coli NOT DETECTED NOT DETECTED Final   Klebsiella aerogenes NOT DETECTED NOT DETECTED Final   Klebsiella oxytoca NOT DETECTED NOT DETECTED Final   Klebsiella pneumoniae NOT DETECTED NOT DETECTED Final   Proteus species NOT DETECTED NOT DETECTED Final   Salmonella species NOT DETECTED NOT DETECTED Final   Serratia marcescens NOT DETECTED NOT DETECTED Final   Haemophilus influenzae NOT DETECTED NOT DETECTED Final   Neisseria meningitidis NOT DETECTED NOT DETECTED Final   Pseudomonas aeruginosa NOT DETECTED NOT DETECTED Final   Stenotrophomonas maltophilia NOT DETECTED NOT DETECTED Final   Candida albicans NOT DETECTED NOT DETECTED Final   Candida auris NOT DETECTED NOT  DETECTED Final   Candida glabrata NOT DETECTED NOT DETECTED Final   Candida krusei NOT DETECTED NOT DETECTED Final   Candida parapsilosis NOT DETECTED NOT DETECTED Final   Candida tropicalis NOT DETECTED NOT DETECTED Final   Cryptococcus neoformans/gattii NOT DETECTED NOT DETECTED Final   Methicillin resistance mecA/C DETECTED (A) NOT DETECTED Final    Comment: CRITICAL RESULT CALLED TO, READ BACK BY AND VERIFIED WITH: Tillman Sers PHARMD 2000 12/06/20 A BROWNING Performed at [facility], [address], [place], [place] [address]     Coagulation Studies: No results for input(s): LABPROT, INR in the last 72 hours.  Urinalysis: No results for input(s): COLORURINE, LABSPEC, PHURINE, GLUCOSEU, HGBUR, BILIRUBINUR, KETONESUR, PROTEINUR, UROBILINOGEN, NITRITE, LEUKOCYTESUR in the last 72 hours.  Invalid input(s): APPERANCEUR  Imaging: No results found.   Medications:   . albumin human 25 g (11/16/20 1516)   . acetaminophen (TYLENOL) oral liquid 160 mg/5 mL  650 mg Oral Q6H  . apixaban  2.5 mg Oral BID  . carbamide peroxide  10 drop Both EARS BID  . chlorhexidine gluconate (MEDLINE KIT)  15 mL Mouth Rinse BID  . Chlorhexidine Gluconate Cloth  6 each Topical Q0600  . cholecalciferol  2,000 Units Oral Daily  . collagenase   Topical BID  . darbepoetin (ARANESP) injection - DIALYSIS  100 mcg Intravenous Q Sat-HD  . diclofenac Sodium  2 g Topical QID  . feeding supplement (NEPRO CARB STEADY)  237 mL Oral TID BM  . feeding supplement (NEPRO CARB STEADY)  780 mL Per Tube Q24H  . guaiFENesin  15 mL Oral Q12H  . insulin aspart  0-5 Units Subcutaneous QHS  . insulin aspart  0-6 Units Subcutaneous TID WC  . insulin detemir  5 Units Subcutaneous BID  . mouth rinse  15 mL Mouth Rinse QID  . megestrol  200 mg Oral QID  . metoprolol tartrate  37.5 mg Oral BID  . midodrine  40 mg Oral 3 times per day on Tue Thu Sat  . nutrition supplement (JUVEN)  1 packet Per Tube BID BM  . nystatin    Topical BID  . pantoprazole sodium  40 mg Oral Daily  . sodium chloride flush  10-40 mL Intracatheter Q12H   albumin human, bacitracin, docusate, Gerhardt's butt cream, guaiFENesin-dextromethorphan, hydrOXYzine, levalbuterol, ondansetron (ZOFRAN) IV, oxyCODONE **OR** oxyCODONE, phenol, polyethylene glycol, Resource ThickenUp Clear, silver nitrate applicators, sodium chloride, sodium chloride flush, sodium chloride HYPERTONIC  Assessment/ Plan:  #1.End-stage renal disease from prolonged dialysis dependent acute kidney Injury:Suspected to be multifactorial acute kidney injury from septic shock/MRSA pneumonia and hemorrhagic shock at the outside. Has been on renal replacement therapy since 09/08/2020 and recently able to transition to intermittent hemodialysis from CRRT. Intermittent hemodialysis has been challenging due to hypotension and positional discomfort from decubitus ulcer.Creatinine remains misleadingly low and is indicative of significant lean muscle mass loss with prolonged hospitalization. She is not a candidate for CIR and family have no desire to have her admitted to SNF or LTACand intend to take her home upon discharge.Continue TTS dialysis while here. Last HD on 2/12 with 1000 cc UF.  Seems like urine output is picking up therefore plan to monitor labs next few days, watch for renal recovery.  I do not see any urgent indication for dialysis however labs are pending from 12/14/2020.  We need to make sure that daily labs are ordered  2.Hypokalemia:  Improved.  3.History of Covid pneumonia with ARDS: Status post tracheostomy and respiratory status appears to be stable with supplementation via T-bar.  4.Atrial fibrillation with rapid ventricular response: on metoprolol, tachycardic. On anticoagulation with Eliquis.  5.Severe protein calorie malnutrition with large sacral decubitus ulcer: Ongoing nutritional supplementation with wound care.  This is probably responsible for the  high BUN and azotemia  6CKD-MBD: Hypercalcemia patient receiving vitamin D products will discontinue  7Anemia: Complicated by GI bleed from perforated duodenal ulcer status post exploratory laparotomy with Phillip Heal patch placement. Received IV iron and continue ESA.  Monitor hemoglobin.  8.  Hypotension patient was on midodrine will discontinue midodrine.  At this point.  Blood pressure appears to be significantly improved    LOS: [place] _0 _1 :04 AM

## 2020-12-15 NOTE — Progress Notes (Signed)
Pt decannulated and occlusive dressing placed over stoma.  Pt's o2 saturations remain 100% on room air with no distress noted.

## 2020-12-15 NOTE — Progress Notes (Signed)
Occupational Therapy Treatment  After therapists exit from room with pt in chair, daughter requesting staff assist for transfer of pt back to bed due to reports of high level of pain at wound site. On entry, pt grimacing and crying. With assist from daughter, OT attempted various repositioning strategies (feet up/down, reclined back slightly, scooting back in chair and placement of additional pillows behind pt) with minimal reported relief. Offered to assist pt to power chair due to ability for prolonged sitting noted yesterday with power chair. Pt declined and requested to return to bed. Daughter served as +2 physical assist (very proficient in line mgmt and positioning strategies) for maximove back to sitting EOB and Total A x 2 for return to supine. Pt repositioned in bed with reports of decreased sacral pain.   Chair exit time: 13:55   12/15/20 1458  OT Visit Information  Last OT Received On 12/15/20  Assistance Needed +2  OT goals addressed during session ADL's and self-care  History of Present Illness Pt 70 y.o. female with medical history significant for recent covid pna, obesity, colon cancer s/p colon resection with colostomy bag, HLD, NIDDM2, CKD3 presented to ED after her follow-up nurse advised her to present to ED for elevated HR. +afib, elevated troponins with demand ischemia,  CT scan is positive for bowel perforation with pneumomediastinum 11/03 exp lap with repair of perforated ulcer. 11/10 t/f to ICU- intubated/sedated/proned. Trach placed 11/24. CRRT off 12/8, restarted 12/10-1/5. 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  Precautions  Precautions Fall  Precaution Comments baseline R colostomy; sacral wound with wound vac, trach capped  Required Braces or Orthoses Other Brace  Other Brace B prafos  Pain Assessment  Pain Assessment Faces  Faces Pain Scale 8  Pain Location bottom  Pain  Descriptors / Indicators Guarding;Grimacing;Sore;Crying  Pain Intervention(s) Premedicated before session;Repositioned  Cognition  Arousal/Alertness Awake/alert  Behavior During Therapy Flat affect  Overall Cognitive Status Impaired/Different from baseline  Area of Impairment Following commands;Problem solving  Following Commands Follows one step commands with increased time  Safety/Judgement Decreased awareness of safety;Decreased awareness of deficits  Awareness Emergent  Problem Solving Slow processing  General Comments difficulty pinpointing positional changes, comfort and pain level with adjustment attempts  Difficult to assess due to Tracheostomy  Upper Extremity Assessment  Upper Extremity Assessment RUE deficits/detail;LUE deficits/detail  RUE Deficits / Details right arm weaker than L arm with decreased ROM at fingers elbowl and shoulder.  Unable to bring hand to face, grip weak and pincer in nature  RUE Coordination decreased fine motor;decreased gross motor  LUE Deficits / Details weak, but more rounded grasp, elbow flexion to bring hand to face, shoulder flexion weak against gravity with upper trap and levator activation scapular elevation to attempt to lift arm up.  LUE Coordination decreased fine motor;decreased gross motor  Lower Extremity Assessment  Lower Extremity Assessment Defer to PT evaluation  Bed Mobility  Overal bed mobility Needs Assistance  Bed Mobility Rolling;Sit to Supine  Rolling Mod assist;+2 for physical assistance;+2 for safety/equipment  Sit to supine Total assist;+2 for physical assistance;+2 for safety/equipment  General bed mobility comments Placed in seated position on EOB via maximove and assisted to supine. Total A to maintain sitting balance EOB. Mod A x 2 for removal of maximove pad.  Balance  Overall balance assessment Needs assistance  Sitting-balance support Bilateral upper extremity supported;Feet unsupported  Sitting balance-Leahy Scale  Zero  Restrictions  Weight Bearing Restrictions No  Vision- Assessment  Vision Assessment? No apparent visual deficits  Transfers  Overall transfer level Needs assistance  Equipment used Ambulation equipment used  Transfer via Welcome transfer comment use of maximove from recliner back to bed. pt declined to attempt sitting in recliner or power chair any further  General Comments  General comments (skin integrity, edema, etc.) daughter present and assisting as +2 assist for transfer back to bed. Attempted various repositioning strategies to increase comfort to complete lunch in chair. However, pt declined any improvements, declined to try power chair and requested to go back to bed.  OT - End of Session  Equipment Utilized During Treatment Other (comment) (maximove)  Activity Tolerance Patient limited by pain  Patient left in bed;with call bell/phone within reach;with family/visitor present  OT Assessment/Plan  OT Plan Discharge plan remains appropriate;Frequency remains appropriate  OT Visit Diagnosis Unsteadiness on feet (R26.81);Other abnormalities of gait and mobility (R26.89);Muscle weakness (generalized) (M62.81);Other symptoms and signs involving cognitive function  OT Frequency (ACUTE ONLY) Min 2X/week  Recommendations for Other Services PT consult  Follow Up Recommendations LTACH;SNF;Supervision/Assistance - 24 hour;Other (comment) (max HH services if continues to decline rehab placement)  OT Equipment Wheelchair (measurements OT);Wheelchair cushion (measurements OT);Hospital bed;Other (comment) (power chair, air cushion, air mattress overlay)  AM-PAC OT "6 Clicks" Daily Activity Outcome Measure (Version 2)  Help from another person eating meals? 3  Help from another person taking care of personal grooming? 3  Help from another person toileting, which includes using toliet, bedpan, or urinal? 1  Help from another person bathing (including washing,  rinsing, drying)? 2  Help from another person to put on and taking off regular upper body clothing? 2  Help from another person to put on and taking off regular lower body clothing? 1  6 Click Score 12  OT Goal Progression  Progress towards OT goals OT to reassess next treatment  Acute Rehab OT Goals  Patient Stated Goal daughter would like her OOB  OT Goal Formulation With patient/family  Time For Goal Achievement 12/20/20  Potential to Achieve Goals Fair  ADL Goals  Pt Will Perform Grooming with mod assist;bed level;sitting  Pt Will Perform Upper Body Dressing with set-up;with supervision;sitting  Pt Will Transfer to Toilet with max assist;with +2 assist;squat pivot transfer;bedside commode  Pt Will Perform Toileting - Clothing Manipulation and hygiene with min guard assist;sitting/lateral leans;sit to/from stand  Pt Will Perform Tub/Shower Transfer with supervision;ambulating;shower seat;rolling walker  Pt/caregiver will Perform Home Exercise Program Increased strength;Both right and left upper extremity;With written HEP provided;With theraband  Pt Will Perform Upper Body Bathing with max assist;sitting;bed level  Additional ADL Goal #3 Pt will complete bed mobility with mod A as precursor to ADLs  OT Time Calculation  OT Start Time (ACUTE ONLY) 1349  OT Stop Time (ACUTE ONLY) 1404  OT Time Calculation (min) 15 min  OT General Charges  $OT Visit 1 Visit  OT Treatments  $Self Care/Home Management  8-22 mins

## 2020-12-15 NOTE — Progress Notes (Signed)
Occupational Therapy Treatment Patient Details Name: Candace [name] MRN: [id number] DOB: [birth year] Today's Date: 12/15/2020    History of present illness Pt 70 y.o. female with medical history significant for recent covid pna, obesity, colon cancer s/p colon resection with colostomy bag, HLD, NIDDM2, CKD3 presented to ED after her follow-up nurse advised her to present to ED for elevated HR. +afib, elevated troponins with demand ischemia,  CT scan is positive for bowel perforation with pneumomediastinum 11/03 exp lap with repair of perforated ulcer. 11/10 t/f to ICU- intubated/sedated/proned. Trach placed 11/24. CRRT off 12/8, restarted 12/10-1/5. 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   OT comments  Coordinated with pt, family and staff for OOB transfer after pain medication administration 1 hour prior to attempts s/p hydrotherapy. Pt overall Mod A x 2 for rolling and placement of maximove pad and transferred to recliner chair via maximove. Guided pt in trunk rotations, reaching activities and pulling self forward to maximize independence in repositioning self for comfort. Extended time spent ensuring pt comfortable in chair (seated on roho cushion, geomat at back, pillows at side and B feet propped up on blankets. Pt left in recliner with daughter present and assisting with lunch. Pt fatigued and dozing off in chair on exit.   Start time in chair: 13:15   Follow Up Recommendations  LTACH;SNF;Supervision/Assistance - 24 hour;Other (comment) (max HH services if continues to decline rehab placement)    Equipment Recommendations  Wheelchair (measurements OT);Wheelchair cushion (measurements OT);Hospital bed;Other (comment) (power chair, air cushion, air mattress overlay)    Recommendations for Other Services      Precautions / Restrictions Precautions Precautions: Fall Precaution Comments: baseline R  colostomy; sacral wound with wound vac, trach capped Required Braces or Orthoses: Other Brace Other Brace: B prafos Restrictions Weight Bearing Restrictions: No       Mobility Bed Mobility Overal bed mobility: Needs Assistance Bed Mobility: Rolling Rolling: Mod assist;+2 for physical assistance;+2 for safety/equipment         General bed mobility comments: Mod A for rolling side to side for placement of maximove pad  Transfers Overall transfer level: Needs assistance Equipment used: Ambulation equipment used             General transfer comment: maximove for bed to chair transfer    Balance Overall balance assessment: Needs assistance Sitting-balance support: Bilateral upper extremity supported;Feet unsupported Sitting balance-Leahy Scale: Poor                                     ADL either performed or assessed with clinical judgement   ADL Overall ADL's : Needs assistance/impaired                     Lower Body Dressing: Total assistance;Bed level Lower Body Dressing Details (indicate cue type and reason): Daughter assisting in donning Prafo to B feet               General ADL Comments: Session focused on OOB transfer and positioning attempts with Roho cushion in recliner     Vision   Vision Assessment?: No apparent visual deficits   Perception     Praxis      Cognition Arousal/Alertness: Awake/alert Behavior During Therapy: Flat affect Overall Cognitive Status: Impaired/Different from baseline Area of Impairment: Following commands;Problem solving  Following Commands: Follows one step commands with increased time Safety/Judgement: Decreased awareness of safety;Decreased awareness of deficits Awareness: Emergent Problem Solving: Slow processing General Comments: Follows commands, slow to respond with increasing fatigue        Exercises     Shoulder Instructions       General  Comments Daughter present and assisting throughout. Guided pt in trunk rotations and pulling forward in recliner chair to increase independence for positioning self. On exit, pt comfortable in chair, fatigued and dozing off.    Pertinent Vitals/ Pain       Pain Assessment: Faces Faces Pain Scale: Hurts a little bit Pain Location: bottom Pain Descriptors / Indicators: Grimacing Pain Intervention(s): Monitored during session;Premedicated before session  Home Living                                          Prior Functioning/Environment              Frequency  Min 2X/week        Progress Toward Goals  OT Goals(current goals can now be found in the care plan section)  Progress towards OT goals: Progressing toward goals  Acute Rehab OT Goals Patient Stated Goal: daughter would like her OOB OT Goal Formulation: With patient/family Time For Goal Achievement: 12/20/20 Potential to Achieve Goals: Fair ADL Goals Pt Will Perform Grooming: with mod assist;bed level;sitting Pt Will Perform Upper Body Bathing: with max assist;sitting;bed level Pt Will Perform Upper Body Dressing: with set-up;with supervision;sitting Pt Will Transfer to Toilet: with max assist;with +2 assist;squat pivot transfer;bedside commode Pt Will Perform Toileting - Clothing Manipulation and hygiene: with min guard assist;sitting/lateral leans;sit to/from stand Pt Will Perform Tub/Shower Transfer: with supervision;ambulating;shower seat;rolling walker Pt/caregiver will Perform Home Exercise Program: Increased strength;Both right and left upper extremity;With written HEP provided;With theraband Additional ADL Goal #1: Pt will sustain attention to ADL task with min VCs Additional ADL Goal #2: Pt will sit EOB for 5 mins with mod A for trunk support with VSS Additional ADL Goal #3: Pt will complete bed mobility with mod A as precursor to ADLs  Plan Discharge plan remains appropriate;Frequency remains  appropriate    Co-evaluation    PT/OT/SLP Co-Evaluation/Treatment: Yes Reason for Co-Treatment: Complexity of the patient's impairments (multi-system involvement);For patient/therapist safety;To address functional/ADL transfers   OT goals addressed during session: ADL's and self-care      AM-PAC OT "6 Clicks" Daily Activity     Outcome Measure   Help from another person eating meals?: A Little Help from another person taking care of personal grooming?: A Little Help from another person toileting, which includes using toliet, bedpan, or urinal?: Total Help from another person bathing (including washing, rinsing, drying)?: A Lot Help from another person to put on and taking off regular upper body clothing?: A Lot Help from another person to put on and taking off regular lower body clothing?: Total 6 Click Score: 12    End of Session Equipment Utilized During Treatment: Other (comment) (maximove)  OT Visit Diagnosis: Unsteadiness on feet (R26.81);Other abnormalities of gait and mobility (R26.89);Muscle weakness (generalized) (M62.81);Other symptoms and signs involving cognitive function   Activity Tolerance Patient tolerated treatment well   Patient Left in chair;with call bell/phone within reach;with family/visitor present   Nurse Communication Mobility status        Time: 3419-3790 OT Time Calculation (min): 47 min  Charges:  OT General Charges $OT Visit: 1 Visit OT Treatments $Self Care/Home Management : 8-22 mins $Therapeutic Activity: 8-22 mins  Malachy Chamber, OTR/L Acute Rehab Services Office: [phone]   Candace [name] 12/15/2020, 2:55 PM

## 2020-12-15 NOTE — Progress Notes (Signed)
[name], MRN:  [id number], DOB:  04-Jan-1951, LOS: 67 ADMISSION DATE:  08/31/2020, CONSULTATION DATE:  09/07/2020 REFERRING MD:  Dr Candiss Norse, CHIEF COMPLAINT:  Acute resp failure  Brief History   70 year old female who was previously diagnosed with Covid 08/23/2020.  Admitted 11/2 with AF-RVR, found to have a perforated duodenal underwent exploratory laparotomy with Phillip Heal patch placement 11/3.  Past Medical History  Covid pneumonia Atrial fibrillation CKD stage III Diabetes mellitus Hypertension Colon cancer Hyperlipidemia  Significant Hospital Events   11/2 Admitted  11/3 OR with findings of perforated duodenal ulcer 11/10 progressive hemorrhagic shock, intubated, transfused, pressors, proned; started on CRRT in PM 11/03 Exploratory laparotomy, Phillip Heal patch, lysis of adhesion for duodenal ulceration postop day 6 11/16 Extubated. Re-intubated overnight due to respiratory distress and hypoxia with decreased mentation 11/18 Bronch, cultures sent 11/19 Hgb down getting blood 11/24 Spiked fever resume empirical antimicrobial therapy 11/26 Hemorrhagic shock, hgb 5.6, increased pressors, CT A/P  11/30 Per palliative "[name] expresses understanding that patient is unfortunately very fragile despite ongoing intensive medical treatment and full mechanical support. She indicates that the family wants to continue with all current interventions despite potential outcomes". 12/08 CRRT discontinued due to clotting 12/09 Family requested transfer to tertiary care [facility]). [facility] denied transfer  12/10 CRRT restarted. Episodes of tachycardia, tachypnea that seem to improve with pain management 12/11 Back in shock. Pressor requirements up. CXR worse. ABX resumed 12/12 Still requiring inc pressors. Had hypoglycemic event. Basal insulin dosing adjusted 12/13 Pressor requirements better. Now hyperglycemic. Re-adjusted Glycemic control  12/14 Changed dilaudid to1/2 dosing from day further. D/c  vasopressin.  Goals of care reconfirmed with daughter.  Patient continues to desire aggressive care.  Not open to discussing any other option, patient family continues to be hopeful that she will be discharged to home with full recovery in spite of multiple attempts by staff to prepare them that this is unlikely scenario 12/15 Dilaudid discontinued sending cortisol for ongoing pressor dependence 12/16 Serum cortisol <20, added stress dose steroids.  PRN Dilaudid, attempting not go back on Dilaudid infusion 12/17 Developed worsening tachycardia during the evening hours received initially IV albumin, followed by resuming IV Dilaudid with question of suboptimal pain control.  Currently looks better back on Dilaudid drip.  We have been able to wean pressors after adding stress dose steroids; near arrest - bradycardia, better with atropine  12/19 Afebrile . Remains on dilaudid and heparin gtt, dilaudid gtt increased overnight for concern of pain / ongoing tachycardia, no other events . NE and precedex off 12/17. Ongoing CRRT- even UF, dosing lokelmia this morning 12/20 On CRRT.  Renal plans for HD cath and moving to HD. Getting wound care  12/23 -No vent weaning per RT, remains on full support, #8 trach in place but previously had #6, no documentation of change noted in chart Afebrile / WBC 20.4  Vent - 30% FiO2, PEEP 5 Glucose range 176-212 I/O 465 ml stool, 3.6L removed with HD, -1.1L in last 24 hours  RN reports ongoing periods of tachypnea / vent dyssynchrony that responds to dilaudid   12/24 - renal stopping CRRT today and plans HD 10/24/20 . 40% fio2 on vent via Trach. TAchypenic and tachycardic. Afebrile but wbc up to 27.6K. On TF. Onn heparin gtt  12/25 - Back on CRRT. On vent via trach at 40% fio2, On scheduled dilaudid as add on to oxy. Per family request 12/24 - they felt scheduled oxy was not adequate and patient was showing  signs of opioid withdrawal.  Patient also had  worsening SIRS/sepsis  syndrome. Had fever, rising wbc, worsening RR 40-60 and HR 140s sinus-> started On abx yesterday. Fever some better today. WBC plateau at 28,.5K. On new levophed -> signifanct improvement in HR 77 and RR t0 20. On heparin gtt.  On precedex gtt. On levophed gtt 18mcg wthi midodrine. On TF 12/30 Remains on CRRT with intermittent pressor requirements. Ethics consult placed evening of 12/29. Ethics rec time trial of CRRT 12/31 failed SBT with RR 40s. Several conversations between care tam and family, who are upset RE plan of care 1/1 back on pressors  1/4: On pressors, keeping even on CVVHD, HGB drop to 6.9, transfused 1 unit. Improving mental status 1/10 remains on low dose levophed 52mcg 1/11 off levo, attempting HD with UF for first time.  -ethics consulted 12/30-- recs for time limited trial of CRRT, please see separate Ethics consult note for full details. Futility policy reviewed with family by ethics. Ongoing meetings with Dr. Hulen Skains with nursing administration and family. The ultimate goal is to transition her safely to HD from CRRT. 11/16/2020 again will be placed on intermittent hemodialysis. 11/16/2018 wheeze criteria for transition to progressive care since she has been off the ventilator for 72 hours. With an order for transfer to progressive care. 11/18/2020 is doing well on progressive care unit. 1/24 copious secretions 1/27 downsized to #6 cuffed 1/29 rapid response called during HD for SVT 2/8 try capping trach  Consults:  Cardiology [facility] Nephrology  Ethics  Procedures:  R PICC 11/5 >>out A line 11/9 >> out ETT 11/9 > 11/16, 11/16 >> 09/21/2020, 09/21/2020 tracheostomy>> Lt [place] CVL 11/9 >> R IJ trialysis >> out HD catheter 12/1 >>12/20 12/21 - 14.5 Fr, 23 cm right IJ tunneled hemodialysis catheter placement.  Removal of indwelling subclavian catheter.   Significant Diagnostic Tests:  11/3 CT abd/ pelvis > 1. Positive for bowel perforation: Pneumoperitoneum and intermediate  density free fluid in the abdomen. Prior total colectomy. The specific site of perforation is unclear-oral contrast present to the proximal jejunum has not obviously leaked. Note that there may be small bowel loops adherent to the ventral abdominal wall along the greater curve of the stomach. 2. Extensive bilateral lower lung pneumonia. No pleural effusion. 3. Other abdominal and pelvic viscera are stable since 2015, including bilateral adrenal adenomas. Chronic renal parapelvic cysts. 4. Aortic Atherosclerosis 11/3 TTE > EF 70-75%, RV not well visualized, mildly reduced RV systolic function 50/09 CT chest/ abd/ pelvis> 1. Interval progression of diffuse bilateral hazy ground-glass airspace opacities with more focal areas of consolidation at the lung bases 2. Trace bilateral pleural effusions. 3. Postsurgical changes the abdomen as detailed above. No evidence for a postoperative abscess, however evaluation is limited by lack of IV contrast. 4. There is a 1.9 cm cystic appearing lesion located in the pancreatic body. This was not present on the patient's CT from 2015.  Follow-up with an outpatient contrast enhanced MRI is recommended. 5. The endometrial stripe appears diffusely thickened. Follow-up with pelvic ultrasound is recommended. Aortic Atherosclerosis 11/14 LE doppler studies > + DVT of right posterior tibial and peroneal vein, +dVT of left posterior tibial vein  11/26 CT ABD PEL > liver unremarkable, distended gallbladder with layering tiny gallstones versus sludge, no duct dilatation, mild hyperdensity right upper pole renal collecting system new.  No evidence of retroperitoneal bleeding.  Multifocal lower lobe predominant pulmonary infiltrates/pneumonia.  Small left pleural effusion. 1/16 Venous duplex RUE >> negative  Micro Data:  11/10 MRSA PCR > neg 11/10 BC x 2 > neg 09/22/2020 blood cultures x2>> S epi.  09/22/2020 sputum culture>> MRSA Blood 12/1 >> negative. S.epi 1 out of 2,  likely contaminant BCx 2 12/12 >> neg xxx Trach aspirate 12/24 - FEW STAPHYLOCOCCUS AUREUS  Blood 12/24 > Negative  Blood 1/2: neg Sputum 1/4: MSSA resp 1/24 MSSA  Antimicrobials:  Per primary  Subjective:  Has not required rescue suctioning since being capped last week. Has not been getting routine suctioning from RT per my recommendation for the past few days. RTs think she will do well if decannulated. Daughter at bedside.  Objective   Blood pressure 125/77, pulse (!) 122, temperature 98 F (36.7 C), temperature source Oral, resp. rate (!) 21, height 5\' 8"  (1.727 m), weight 92.6 kg, SpO2 100 %.    FiO2 (%):  [21 %] 21 %   Intake/Output Summary (Last 24 hours) at 12/15/2020 1536 Last data filed at 12/15/2020 1400 Gross per 24 hour  Intake 26 ml  Output 2112 ml  Net -2086 ml   Filed Weights   Physical Exam  Chronically ill appearing woman sleeping in bed, arousing with stimulation. [place]/AT, eyes anicteric Trach in place, capped RRR, S1S2 CTAB, on RA Abd soft, NT No peripheral edema, no cyanosis Answering questions appropriately, face symmetric   Resolved problems:  Previously hemorrhagic shock requiring PRBCs, Septic shock, MRSA pneumonia s/p 8 days abx Complicated by MRSA healthcare associated pneumonia (treated) Peritonitis and perforated ulcer post repair 09/01/20   Assessment:  Candace [name] is a 70 y.o. with history of COVID 19 infection requiring hospitalization in October 2021 with current admission for hemorrhagic shock secondary to perforated gastric ulcer requiring surgical intervention and [place]. 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 Ok to decannulate. Discussed with her daughter at bedside that this means that if she has recurrent severe  respiratory failure, he would require reintubation vs with a trach she could more easily be placed back on a vent. With the capping trial >1 week, she is likely to do well. Her overall clinical improvement is helpful.  -RT to decanulate and dress wound. Expect stoma to close in a few days. -Con't efforts at mobility. -Daughter at bedside agrees with plan.  PCCM will sign off. Please call with questions.    Julian Hy, DO 12/15/20 4:24 PM [facility]  From 7AM- 7PM if no response to pager, please call [phone]. After hours, 7PM- 7AM, please call Elink  [phone].

## 2020-12-15 NOTE — Progress Notes (Signed)
Physical Therapy Wound Treatment Patient Details  Name: Candace [name] MRN: [id number] Date of Birth: 01-06-1951  Today's Date: 12/15/2020 Time: 5300-5110 Time Calculation (min): 96 min  Subjective  Subjective: Pt daughter wanting to initiate hydrotherapy at exactly 1.5 hours following pain medication. Patient and Family Stated Goals: heal wound Date of Onset:  (unknown) Prior Treatments: prior hydro and Dakins  Pain Score:  2/10  Wound Assessment  Pressure Injury 09/25/20 Sacrum Bilateral;Medial Deep Tissue Pressure Injury - Purple or maroon localized area of discolored intact skin or blood-filled blister due to damage of underlying soft tissue from pressure and/or shear. Purple, non-blanchable, b (Active)  Dressing Type Barrier Film (skin prep);Negative pressure wound therapy 12/15/20 1247  Dressing Changed 12/15/20 1247  Dressing Change Frequency Monday, Wednesday, Friday 12/15/20 1247  State of Healing Early/partial granulation 12/15/20 1247  Site / Wound Assessment Pink;Yellow;Black;Granulation tissue;Red 12/15/20 1247  % Wound base Red or Granulating 50% 12/15/20 1247  % Wound base Yellow/Fibrinous Exudate 45% 12/15/20 1247  % Wound base Black/Eschar 5% 12/15/20 1247  % Wound base Other/Granulation Tissue (Comment) 0% 12/15/20 1247  Peri-wound Assessment Intact 12/15/20 1247  Wound Length (cm) 14 cm 12/01/20 1722  Wound Width (cm) 12 cm 12/01/20 1722  Wound Depth (cm) 6 cm 12/01/20 1722  Wound Surface Area (cm^2) 168 cm^2 12/01/20 1722  Wound Volume (cm^3) 1008 cm^3 12/01/20 1722  Tunneling (cm) 0 12/01/20 1722  Undermining (cm) 0 12/01/20 1722  Margins Unattached edges (unapproximated) 12/15/20 1247  Drainage Amount Minimal 12/15/20 1247  Drainage Description Sanguineous 12/15/20 1247  Treatment Debridement (Selective);Negative pressure wound therapy 12/15/20 1247     Hydrotherapy Pulsed lavage therapy - wound location: sacrum Pulsed Lavage with Suction (psi): 12  psi Pulsed Lavage with Suction - Normal Saline Used: 1000 mL Pulsed Lavage Tip: Tip with splash shield Selective Debridement Selective Debridement - Location: sacrum Selective Debridement - Tools Used: Forceps;Scalpel Selective Debridement - Tissue Removed: unviable tissue   Wound Assessment and Plan  Wound Therapy - Assess/Plan/Recommendations Wound Therapy - Clinical Statement: Wound bed continues to improve, up to 50% granulation tissue with beefier appearance. Yellow slough more adherent. Will continue to follow. Wound Therapy - Functional Problem List: Global weakness and immobility Factors Delaying/Impairing Wound Healing: Diabetes Mellitus;Immobility;Multiple medical problems;Other (comment) (poor nutrition) Hydrotherapy Plan: Debridement;Pulsatile lavage with suction;Patient/family education;Dressing change Wound Therapy - Frequency: 6X / week Wound Therapy - Follow Up Recommendations: Skilled nursing facility Wound Plan: see above  Wound Therapy Goals- Improve the function of patient's integumentary system by progressing the wound(s) through the phases of wound healing (inflammation - proliferation - remodeling) by: Decrease Necrotic Tissue to: 20 Decrease Necrotic Tissue - Progress: Progressing toward goal Increase Granulation Tissue to: 80 Increase Granulation Tissue - Progress: Progressing toward goal Goals/treatment plan/discharge plan were made with and agreed upon by patient/family: Yes Time For Goal Achievement: 7 days Wound Therapy - Potential for Goals: Fair  Goals will be updated until maximal potential achieved or discharge criteria met.  Discharge criteria: when goals achieved, discharge from hospital, MD decision/surgical intervention, no progress towards goals, refusal/missing three consecutive treatments without notification or medical reason.  GP    [name], PT, DPT Acute Rehabilitation Services Pager [phone] Office [phone]   [name] 12/15/2020, 12:52 PM

## 2020-12-15 NOTE — Progress Notes (Addendum)
[facility] PROGRESS NOTE   ADDENDUM: I have seen and examined this patient myself. I have reviewed [name], NP's note. I am in agreement with her evaluation and plan. The patient is complaining of pain in her ears. Visualization of the TM's is impeded by cerumen impaction which NP Lissa Merlin is treating with murine drops. I will add nasal saline which will relieve any eustachian tube swelling and allow any pressure behind the TM's to be equalized.   Called regarding concern over telemetry and ST segment elevation either in VII or V2. EKG obtained. Other than rate EKG is unchanged. Keilee Denman ZOX:[id number] DOB: [birth year] DOA: 08/31/2020 PCP: Algis Greenhouse, MD            Status: Remains inpatient appropriate because:Ongoing diagnostic testing needed not appropriate for outpatient work up, Unsafe d/c plan, IV treatments appropriate due to intensity of illness or inability to take PO and Inpatient level of care appropriate due to severity of illness   Dispo: The patient is from: Home              Anticipated d/c is to: Home with home health               Anticipated d/c date is: > 3 days              Patient currently is not medically stable to d/c.  Barriers to discharge: Continues to require HD- unable to sit in chair/get in [facility] for [facility]-also requiring Cortrak for nocturnal feeds; still has trach with copiuous secretions at times  Considerations for home discharge: 1. Patient will need to continue wound VAC and current wound VAC is apparently not available in the home setting 2. It is likely she will need a specialty bed.  We are currently transitioning back to the [place] rotational bed which will allow for better in bed mobility including the ability to "stand" with PT in the bed without actually getting patient out of bed 3. Likely would need to be decannulated to minimize other care issues after discharge 4. Would need to be able to mobilize for outpatient hemodialysis 5.  Would not have aggressive PT OT available in the home setting that she can achieve during hospitalization 6. Patient will have family members that are CNAs managing her after discharge but this would be 24/7 requirement and this could be an overwhelming issue once patient is actually home  Code Status: Full Family Communication: 2/16 daughter [name] at bedside DVT prophylaxis: Eliquis Vaccination status: Has not been vaccinated but did have severe COVID infection initially diagnosed on 08/23/2020.  Patient will be eligible for COVID-vaccine after 11/23/2020   Foley catheter: No, purewick female urinary collection device   HPI: 70 year old female patient with prior history of diabetes, stage III chronic kidney disease, atrial fibrillation, hypertension, dyslipidemia and colon cancer that is post colectomy and colostomy.  Initially diagnosed with COVID on 08/23/20 she was admitted to the hospital due to dehydration, acute kidney injury and hypoxemic respiratory failure.  She was discharged home on 2 to 3 L of oxygen after being treated with IV steroids and remdesivir.  During that time although she had elevated inflammatory markers she had no embolic or thrombotic disease.  She had been on prophylactic dose heparin during the hospitalization and was placed on Eliquis for 2 weeks for after discharge  Patient returned to the ER on 08/31/2020 for complaints of not feeling well.  She was found to be in atrial fibrillation with RVR,  hemorrhagic shock and work-up revealed a perforated duodenal ulcer.  She underwent exploratory laparotomy on 11/3.  She has had an extensive ICU stay with significant events as below.   11/2 Admitted with rapid Afib 11/3 OR with findings of perforated duodenal ulcer 11/10 progressive hemorrhagic shock, intubated, transfused, pressors, proned; started on CRRT in PM 11/16 Extubated. Re-intubated overnight due to respiratory distress and hypoxia with decreased mentation 11/18  Bronch, cultures sent 11/19 Hgb down getting blood 11/24 Spiked fever, resume empirical antimicrobial therapy 11/26 Hemorrhagic shock, hgb 5.6, increased pressors,  11/30 Per palliative "[name] understanding that patient is unfortunately very fragiledespite ongoing intensive medical treatment and full mechanical support. [name] that the familywantsto continue with all current interventions despite potential outcomes". 12/08 CRRT discontinued due to clotting 12/09 Family requested transfer to tertiary care [facility] denied transfer  12/10 CRRT restarted. Episodes of tachycardia, tachypnea that seem to improve with pain management 12/11 Back in shock. Pressor requirements up. CXR worse. ABX resumed 12/12 Still requiring inc pressors. Had hypoglycemic event. Basal insulin dosing adjusted 12/13 Pressor requirements better. Now hyperglycemic. Re-adjusted Glycemic control  12/19 Afebrile . Remains on dilaudid and heparin gtt, dilaudid gtt increased overnight for concern of pain / ongoing tachycardia, no other events . NE and precedex off 12/17. Ongoing CRRT- even UF, dosing lokelmia this morning 12/20 On CRRT. Renal plans for HD cath and moving to HD. Getting wound care 12/24 - renal stopping CRRT today and plans HD 10/24/20 . 40% fio2 on vent via Trach. TAchypenic and tachycardic. Afebrile but wbc up to 27.6K. On TF. On heparin gtt 12/25 - Back on CRRT. On vent via trach at 40% fio2, On scheduled dilaudid as add on to oxy. Per family request 12/24 - they felt scheduled oxy was not adequate and patient was showing signs of opioid withdrawal.  Patient also had worsening SIRS/sepsis syndrome. Had fever, rising wbc, worsening RR 40-60 and HR 140s sinus->started On abx yesterday. Fever some better today. WBC plateau at 28,.5K. On new levophed ->signifanct improvement in HR 77 and RR t0 20. On heparin gtt. On precedex gtt. On levophed gtt 80mg wthi midodrine. On TF 12/30 Remains on  CRRT with intermittent pressor requirements. Ethics consult placed 12/29. Ethics rec time trial of CRRT 12/31 failed SBT with RR 40s. Several conversations between care tam and family, who are upset RE plan of care 1/1 back on pressors  1/4: On pressors, keeping even on CVVHD, HGB drop to 6.9, transfused 1 unit. Improving mental status 1/10 remains on low dose levophed 298m 1/11 off levo, attempting HD with UF for first time. Now tolerating intermittent HD. 1/19 patient transferred to progressive bed with tracheostomy on 35% FiO2.  has NG tube feeding.  No PEG tube.  Received dialysis on 1/18. 1/24 significant improvement and persistent tachycardia after introduction of twice daily beta-blocker.  Heart rates have improved from the 130s to the 98-104 range 1/24 core track clogged and therefore has been removed by nutrition team.  Calorie count in progress and we are weaning any sedating medications which could be contributing to patient's inability to eat. 1/27 continue w/ cuff #6 trach   Subjective: Patient awake without any specific complaints.  When questioned about sacral pain from decubitus she says it has actually improved.   Objective: Vitals:   12/15/20 0344 12/15/20 0348  BP:  106/71  Pulse: (!) 105 (!) 104  Resp: 18 19  Temp:  98.9 F (37.2 C)  SpO2: 100% 100%  Intake/Output Summary (Last 24 hours) at 12/15/2020 0758 Last data filed at 12/15/2020 0553 Gross per 24 hour  Intake 960 ml  Output 2212 ml  Net -1252 ml   Filed Weights    Exam: Constitutional: Alert, calm in no acute distress Respiratory: Lungs clear, no increased work of breathing.  FiO2 21%. #4 cuffed trach with PMV-no secretions and does not require suctioning able to cough up secretions easily Cardiovascular: S1-S2, no peripheral edema, extremities warm to touch, pulse regular and intermittently tachycardic Abdomen: On regular diet with nectar thick liquids- calorie count in progress- LBM 2/15-cortrack  for nocturnal tube feedings Skin:  Massive sacral decubitus ulcer-wound VAC-PT at bedside currently performing VAC change and wound debridement.  Wound base considerably improved with very little fibrin/necrotic tissue debris.  Primarily pink granular tissue that bleeds easily. Neurologic: CN 2-12 grossly intact. Sensation intact, DTR normal. Strength 1/5 x all 4 extremities.  Psychiatric: Alert and oriented x3.  Pleasant affect.   Assessment/Plan: Acute problems: PAF maintaining sinus rhythm w/persistent tachycardia -Continue metoprolol to 37.5 mg BID -Echocardiogram this admission with an EF 50 to 55% with mild diastolic dysfunction parameters and normal RV systolic function -Continue Eliquis-benefits coordinator has determined that Eliquis is covered at [facility] [place] with a co-pay of $47  Acute respiratory failure secondary to COVID-pneumonia/tracheostomy/acute MSSA tracheobronchitis -Patient stable on low-flow oxygen FiO2 21% -PCCM/trach team following-changed to #4 trach on 2/4-has not required suctioning and is able to expectorate secretions -2/16 trach team updated and will evaluate patient today and we are hopeful for decannulation -Completed IV Ancef for MSSA tracheobronchitis  Right ear pain/possible impacted cerumen -Evaluated by attending physician over the weekend and was noted to have significant amount of cerumen in ear. -Started on Debrox 10 drops 2 times daily but continues to have ear pain and now complaining of sore throat -Hopeful that trach tube will be removed today in the event trach tube contributing to ear pain and sore throat; once decannulated if symptoms persist will need to reexamine ear and consider direct irrigation  Acute kidney injury secondary to COVID-related sepsis with shock superimposed on stage III chronic kidney disease -Continue intermittent hemodialysis as directed by the renal team -Avoid nephrotoxic medications -Continue Aranesp on dialysis  days -Creatinine has normalized with renal attributing this to marked decrease in muscle mass from critical illness.  Plan is to follow labs and monitor urine output. -Nephrology team remains hopeful of renal recovery.  Last HD treatment was 2/12 with removal of 1.4 L-as of today creatinine down to 0.77 with elevated BUN -Continue midodrine on dialysis days only 3 times daily  Lower extremity muscle pain -Continue Crea topically-attending MD added topical NSAIDs on 2/10 with improvement in pain.  Patient clarifies this is not a cramping intermittent pain but a constant pain. -Continue PROM and AROM -Family to bring in tennis shoes to wear in an alternating schedule to help improve foot flexion noting this improves her leg pain  Anxiety -Vistaril prn added by attending on 2/10-added administration instruction to give at least 30 minutes prior to dialysis treatment at request of daughter -Patient reminded of availability of as needed Vistaril and has been encouraged to use as described above  Dysphagia/moderate to severe protein calorie malnutrition Nutrition Status: Nutrition Problem: Increased nutrient needs Etiology: wound healing Signs/Symptoms: estimated needs Interventions: Refer to RD note for recommendations  Estimated body mass index is 31.04 kg/m as calculated from the following:   Height as of this encounter: 5' 8"  (1.727 m).  Weight as of this encounter: 92.6 kg.  -Continue regular diet with nectar thick liquids -Continue nocturnal tube feedings -Continue Megace and Nepro shakes orally -SLP evaluation 2/13 with recommendation to change to intermittent supervision with meals -2/14 appropriate meds changed to crush and administer with applesauce  Acute hemorrhagic shock secondary to perforated duodenal ulcer/anemia of critical illness (initial reason for admission) -Stable postoperatively -Hemoglobin remains stable greater than 8.0  Diabetes mellitus 2 -Patient has been  transition to regular diet along with tube feedings and we have noted significant increase in CBG readings -CBGs increasing as PO intake increase-2/15 will add Levemir 5 units BID -HgbA1c 08/24/2020 and was 7.0 -Prior to admission patient was on metformin XR-unable to use at this juncture due to severity of kidney disease -we are hopeful for recovery of renal function  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 -SNF recommended but family wants to take patient home -Patient continues to make small but impressive improvements in mobility daily -Patient now has a power wheelchair which she has been able to utilize as of last week-does require Hoyer lift to transfer -Family request no SNF rehab due to pt prior request to NEVER be placed in any type of facility -Continue PROM every 4 hours-patient encouraged to perform independent AROM as well -2/3 order placed for repositioning every 2 hours -2/11 continue therabands to siderails for UE resistance training -OOB to electric WC (2nd time) on 2/15 and tolerated well  Stage IV sacral decubitus -Not present on admission -Wound care nurse following-continue now has wound VAC and hydrotherapy -Surgical team consulted.  Current recommendation is against pursuing general anesthesia to undergo extensive surgical procedure-hopefully can be reevaluated in the future if wound does not heal adequately.  As of 1/28 they have nothing further acutely to add but will be following peripherally. -Continue Kreg rotational bed  -Continue oxycodone for pain  -Continue pain assessment every 2 hours   -  11/24/2020 after [facility]      11/22/2020                       11/29/2020     12/06/2020                             12/13/2020   Incision (Closed) 09/01/20 Abdomen (Active)  Date First Assessed/Time First Assessed: 09/01/20 1737   Location: Abdomen    Assessments 09/01/2020  6:25 PM 12/14/2020  8:00 AM  Dressing Type Gauze (Comment) Gauze  (Comment)  Dressing -- Clean;Dry;Intact  Site / Wound Assessment Dressing in place / Unable to assess Dressing in place / Unable to assess  Drainage Amount None --     No Linked orders to display     Pressure Injury 09/17/20 Ear Left;Anterior;Posterior Stage 2 -  Partial thickness loss of dermis presenting as a shallow open injury with a red, pink wound bed without slough. (Active)  Date First Assessed/Time First Assessed: 09/17/20 0800   Location: Ear  Location Orientation: Left;Anterior;Posterior  Staging: Stage 2 -  Partial thickness loss of dermis presenting as a shallow open injury with a red, pink wound bed without slough. ...    Assessments 09/17/2020  8:00 AM 12/05/2020  8:00 PM  Dressing Type Foam - Lift dressing to assess site every shift None  Dressing Clean;Dry;Intact --  Dressing Change Frequency Every 3 days --  Site / Wound Assessment Dry;Pink --  Peri-wound Assessment Intact --  Wound Length (  cm) 2 cm --  Wound Width (cm) 1 cm --  Wound Depth (cm) 0.25 cm --  Wound Surface Area (cm^2) 2 cm^2 --  Wound Volume (cm^3) 0.5 cm^3 --  Margins Unattached edges (unapproximated) --  Drainage Amount Scant --  Drainage Description Serosanguineous --  Treatment Cleansed --     No Linked orders to display     Pressure Injury 09/25/20 Sacrum Bilateral;Medial Deep Tissue Pressure Injury - Purple or maroon localized area of discolored intact skin or blood-filled blister due to damage of underlying soft tissue from pressure and/or shear. Purple, non-blanchable, b (Active)  Date First Assessed/Time First Assessed: 09/25/20 2000   Location: Sacrum  Location Orientation: Bilateral;Medial  Staging: Deep Tissue Pressure Injury - Purple or maroon localized area of discolored intact skin or blood-filled blister due to damage o...    Assessments 09/25/2020  5:00 PM 12/14/2020  8:00 AM  Dressing Type Foam - Lift dressing to assess site every shift --  Dressing Changed;Clean;Dry;Intact --   Treatment -- Negative pressure wound therapy     No Linked orders to display     Negative Pressure Wound Therapy Sacrum (Active)  Placement Date/Time: 11/22/20 1500   Wound Type: (c) Other (Comment)  Location: Sacrum    Assessments 11/22/2020  4:59 PM 12/15/2020  5:53 AM  Last dressing change 11/22/20 --  Site / Wound Assessment Granulation tissue;Pale;Yellow Clean;Dry;Black;Pink  Peri-wound Assessment Intact Intact  Size see above --  Wound filler - Black foam 2 --  Wound filler - White foam 0 --  Wound filler - Nonadherent 0 --  Wound filler - Gauze 0 --  Cycle Continuous --  Target Pressure (mmHg) 125 --  Instillation Volume 26 mL --  Instillation Solution Normal Saline --  Instillation Soak Time 10 minutes --  Instillation Therapy Time 3.5 hours --  Canister Changed No --  Dressing Status Intact Intact  Drainage Amount None None  Output (mL) -- 0 mL     No Linked orders to display     Other problems: Coag neg staph-1/2 blood cultures positive -dw ID pharmacist- cultures c/w contamination Vancomycin ordered overnight stopped on 2/8  Acute encephalopathy secondary to prolonged hospital stay -Resolved  History of stage IV colon cancer -Old colostomy  Acute encephalopathy with ICU delirium -Delirium has resolved and supportive meds of Klonopin and Seroquel have been weaned and discontinued  Hypomagnesemia -Continue oral magnesium -1/21 give 4 g magnesium IV x1 -Follow labs  DVT bilateral posterior tibial veins -Was not present during initial COVID admission -Continue Eliquis for now given severe debility and increased risk for developing recurrent DVT  Hypertension -Having issues with orthostasis requiring midodrine Prior to admission patient was on Norvasc  Dyslipidemia -Prior to admission patient was on Crestor-consider resuming soon once patient can swallow pills whole  Abnormal TSH -Nov 2021 TSH was 0.015-TSH and free T4, T3 normal this  admission  Data Reviewed: Basic Metabolic Panel: Recent Labs  Lab 12/09/20 0903 12/11/20 0643 12/12/20 1100 12/13/20 0119 12/14/20 0746 12/15/20 0101  NA 137 140 131* 132* 133* 134*  K 3.1* 2.6* 4.2 4.5 4.3 4.8  CL 102 113* 99 99 106 104  CO2 23 18* 20* 21* 17* 20*  GLUCOSE 163* 103* 220* 234* 281* 197*  BUN 63* 66* 76* 90* 79* 76*  CREATININE 1.00 0.64 1.05* 1.00 0.86 0.77  CALCIUM 10.7* 8.3* 10.2 10.0 10.7* 11.5*  MG  --  1.2*  --   --   --   --  PHOS 3.2  --  4.1 4.1 3.1 2.9   Liver Function Tests: Recent Labs  Lab 12/11/20 0643 12/12/20 1100 12/13/20 0119 12/14/20 0746 12/15/20 0101  AST 15  --   --   --   --   ALT 13  --   --   --   --   ALKPHOS 63  --   --   --   --   BILITOT 0.4  --   --   --   --   PROT 5.8*  --   --   --   --   ALBUMIN 1.8* 2.5* 2.4* 2.6* 2.6*   No results for input(s): LIPASE, AMYLASE in the last 168 hours. No results for input(s): AMMONIA in the last 168 hours. CBC: Recent Labs  Lab 12/09/20 0903 12/11/20 0643  WBC 16.5* 15.7*  HGB 8.1* 7.9*  HCT 26.8* 25.1*  MCV 87.9 85.7  PLT 443* 283   Cardiac Enzymes: No results for input(s): CKTOTAL, CKMB, CKMBINDEX, TROPONINI in the last 168 hours. BNP (last 3 results) Recent Labs    09/07/20 0118 09/08/20 0446 09/09/20 0428  BNP 216.8* 432.5* 609.7*    ProBNP (last 3 results) No results for input(s): PROBNP in the last 8760 hours.  CBG: Recent Labs  Lab 12/14/20 1628 12/14/20 1956 12/14/20 2307 12/15/20 0345 12/15/20 0734  GLUCAP 161* 246* 172* 245* 186*    Recent Results (from the past 240 hour(s))  Culture, blood (routine x 2)     Status: Abnormal   Collection Time: 12/05/20 11:07 AM   Specimen: BLOOD RIGHT ARM  Result Value Ref Range Status   Specimen Description BLOOD RIGHT ARM  Final   Special Requests   Final    BOTTLES DRAWN AEROBIC ONLY Blood Culture adequate volume   Culture  Setup Time   Final    GRAM POSITIVE COCCI IN CLUSTERS AEROBIC BOTTLE  ONLY CRITICAL RESULT CALLED TO, READ BACK BY AND VERIFIED WITH: Tillman Sers PHARMD 2000 12/06/20 A BROWNING    Culture (A)  Final    STAPHYLOCOCCUS EPIDERMIDIS THE SIGNIFICANCE OF ISOLATING THIS ORGANISM FROM A SINGLE SET OF BLOOD CULTURES WHEN MULTIPLE SETS ARE DRAWN IS UNCERTAIN. PLEASE NOTIFY THE MICROBIOLOGY DEPARTMENT WITHIN ONE WEEK IF SPECIATION AND SENSITIVITIES ARE REQUIRED. Performed at [facility], [place] [address], [place], [place] [address]    Report Status 12/08/2020 FINAL  Final  Culture, blood (routine x 2)     Status: None   Collection Time: 12/05/20 11:07 AM   Specimen: BLOOD LEFT HAND  Result Value Ref Range Status   Specimen Description BLOOD LEFT HAND  Final   Special Requests   Final    BOTTLES DRAWN AEROBIC ONLY Blood Culture adequate volume   Culture   Final    NO GROWTH 5 DAYS Performed at [facility], [place] [address], [place], [place] [address]    Report Status 12/10/2020 FINAL  Final  Blood Culture ID Panel (Reflexed)     Status: Abnormal   Collection Time: 12/05/20 11:07 AM  Result Value Ref Range Status   Enterococcus faecalis NOT DETECTED NOT DETECTED Final   Enterococcus Faecium NOT DETECTED NOT DETECTED Final   Listeria monocytogenes NOT DETECTED NOT DETECTED Final   Staphylococcus species DETECTED (A) NOT DETECTED Final    Comment: CRITICAL RESULT CALLED TO, READ BACK BY AND VERIFIED WITH: Tillman Sers PHARMD 2000 12/06/20 A BROWNING    Staphylococcus aureus (BCID) NOT DETECTED NOT DETECTED Final  Staphylococcus epidermidis DETECTED (A) NOT DETECTED Final    Comment: Methicillin (oxacillin) resistant coagulase negative staphylococcus. Possible blood culture contaminant (unless isolated from more than one blood culture draw or clinical case suggests pathogenicity). No antibiotic treatment is indicated for blood  culture contaminants. CRITICAL RESULT CALLED TO, READ BACK BY AND VERIFIED WITH: Tillman Sers PHARMD 2000 12/06/20 A BROWNING     Staphylococcus lugdunensis NOT DETECTED NOT DETECTED Final   Streptococcus species NOT DETECTED NOT DETECTED Final   Streptococcus agalactiae NOT DETECTED NOT DETECTED Final   Streptococcus pneumoniae NOT DETECTED NOT DETECTED Final   Streptococcus pyogenes NOT DETECTED NOT DETECTED Final   A.calcoaceticus-baumannii NOT DETECTED NOT DETECTED Final   Bacteroides fragilis NOT DETECTED NOT DETECTED Final   Enterobacterales NOT DETECTED NOT DETECTED Final   Enterobacter cloacae complex NOT DETECTED NOT DETECTED Final   Escherichia coli NOT DETECTED NOT DETECTED Final   Klebsiella aerogenes NOT DETECTED NOT DETECTED Final   Klebsiella oxytoca NOT DETECTED NOT DETECTED Final   Klebsiella pneumoniae NOT DETECTED NOT DETECTED Final   Proteus species NOT DETECTED NOT DETECTED Final   Salmonella species NOT DETECTED NOT DETECTED Final   Serratia marcescens NOT DETECTED NOT DETECTED Final   Haemophilus influenzae NOT DETECTED NOT DETECTED Final   Neisseria meningitidis NOT DETECTED NOT DETECTED Final   Pseudomonas aeruginosa NOT DETECTED NOT DETECTED Final   Stenotrophomonas maltophilia NOT DETECTED NOT DETECTED Final   Candida albicans NOT DETECTED NOT DETECTED Final   Candida auris NOT DETECTED NOT DETECTED Final   Candida glabrata NOT DETECTED NOT DETECTED Final   Candida krusei NOT DETECTED NOT DETECTED Final   Candida parapsilosis NOT DETECTED NOT DETECTED Final   Candida tropicalis NOT DETECTED NOT DETECTED Final   Cryptococcus neoformans/gattii NOT DETECTED NOT DETECTED Final   Methicillin resistance mecA/C DETECTED (A) NOT DETECTED Final    Comment: CRITICAL RESULT CALLED TO, READ BACK BY AND VERIFIED WITH: Tillman Sers PHARMD 2000 12/06/20 A BROWNING Performed at [facility], [address], [place], [place] [address]      Studies: No results found.  Scheduled Meds: . acetaminophen (TYLENOL) oral liquid 160 mg/5 mL  650 mg Oral Q6H  . apixaban  2.5 mg Oral BID  . carbamide  peroxide  10 drop Both EARS BID  . chlorhexidine gluconate (MEDLINE KIT)  15 mL Mouth Rinse BID  . Chlorhexidine Gluconate Cloth  6 each Topical Q0600  . cholecalciferol  2,000 Units Oral Daily  . collagenase   Topical BID  . darbepoetin (ARANESP) injection - DIALYSIS  100 mcg Intravenous Q Sat-HD  . diclofenac Sodium  2 g Topical QID  . feeding supplement (NEPRO CARB STEADY)  237 mL Oral TID BM  . feeding supplement (NEPRO CARB STEADY)  780 mL Per Tube Q24H  . guaiFENesin  15 mL Oral Q12H  . insulin aspart  0-5 Units Subcutaneous QHS  . insulin aspart  0-6 Units Subcutaneous TID WC  . insulin detemir  5 Units Subcutaneous BID  . mouth rinse  15 mL Mouth Rinse QID  . megestrol  200 mg Oral QID  . metoprolol tartrate  37.5 mg Oral BID  . midodrine  40 mg Oral 3 times per day on Tue Thu Sat  . nutrition supplement (JUVEN)  1 packet Per Tube BID BM  . nystatin   Topical BID  . pantoprazole sodium  40 mg Oral Daily  . sodium chloride flush  10-40 mL Intracatheter Q12H   Continuous Infusions: .  albumin human 25 g (11/16/20 1516)    Principal Problem:   Atrial fibrillation with RVR ([place]) Active Problems:   Acute respiratory failure due to COVID-19 [facility])   New onset a-fib (HCC)   Leukocytosis   Atrial fibrillation with rapid ventricular response (HCC)   Hypoxia   Pressure injury of skin   Acute hypoxemic respiratory failure (HCC)   ARDS (adult respiratory distress syndrome) (HCC)   Perforated duodenal ulcer ([place])   On mechanically assisted ventilation ([place])   Palliative care by specialist   Goals of care, counseling/discussion   Shock ([place])   Acute and chronic respiratory failure (acute-on-chronic) ([place])   Status post tracheostomy ([place])   ESRD (end stage renal disease) ([place])   HAP (hospital-acquired pneumonia)   Consultants:  Cardiology  Surgery  Nephrology  Ethics  PCCM   Procedures: R PICC 11/5 >> A line 11/9 >> out ETT 11/9 > 11/16, 11/16 >> 09/21/2020,  09/21/2020 tracheostomy>> Lt [place] CVL 11/9 >> R IJ trialysis >> out HD catheter 12/1 >>12/20 12/21 - 14.5 Fr, 23 cm right IJ tunneled hemodialysis catheter placement. Removal of indwelling subclavian catheter.   Antibiotics: Anti-infectives (From admission, onward)   Start     Dose/Rate Route Frequency Ordered Stop   12/07/20 1200  vancomycin (VANCOCIN) IVPB 1000 mg/200 mL premix  Status:  Discontinued        1,000 mg 200 mL/hr over 60 Minutes Intravenous Every T-Th-Sa (Hemodialysis) 12/06/20 2156 12/07/20 1113   12/06/20 2245  vancomycin (VANCOREADY) IVPB 2000 mg/400 mL        2,000 mg 200 mL/hr over 120 Minutes Intravenous  Once 12/06/20 2156 12/07/20 0211   11/27/20 2000  ceFAZolin (ANCEF) IVPB 1 g/50 mL premix        1 g 100 mL/hr over 30 Minutes Intravenous Every 24 hours 11/26/20 1248 12/03/20 2136   11/26/20 1345  ceFAZolin (ANCEF) IVPB 1 g/50 mL premix        1 g 100 mL/hr over 30 Minutes Intravenous  Once 11/26/20 1248 11/26/20 1433   11/17/20 1415  fluconazole (DIFLUCAN) 40 MG/ML suspension 200 mg        200 mg Oral  Once 11/17/20 1315 11/17/20 1357   11/03/20 2200  meropenem (MERREM) 500 mg in sodium chloride 0.9 % 100 mL IVPB        500 mg 200 mL/hr over 30 Minutes Intravenous Every 24 hours 11/03/20 1500 11/06/20 2217   11/01/20 1000  anidulafungin (ERAXIS) 100 mg in sodium chloride 0.9 % 100 mL IVPB  Status:  Discontinued       "Followed by" Linked Group Details   100 mg 78 mL/hr over 100 Minutes Intravenous Every 24 hours 10/31/20 0916 11/01/20 1404   10/31/20 1015  meropenem (MERREM) 1 g in sodium chloride 0.9 % 100 mL IVPB  Status:  Discontinued        1 g 200 mL/hr over 30 Minutes Intravenous Every 8 hours 10/31/20 0916 11/03/20 1500   10/31/20 1015  linezolid (ZYVOX) IVPB 600 mg  Status:  Discontinued        600 mg 300 mL/hr over 60 Minutes Intravenous Every 12 hours 10/31/20 0916 11/02/20 0906   10/31/20 1015  anidulafungin (ERAXIS) 200 mg in sodium chloride 0.9  % 200 mL IVPB       "Followed by" Linked Group Details   200 mg 78 mL/hr over 200 Minutes Intravenous  Once 10/31/20 0916 10/31/20 1630   10/25/20 1800  ceFAZolin (ANCEF) IVPB 2g/100 mL  premix  Status:  Discontinued        2 g 200 mL/hr over 30 Minutes Intravenous Every 12 hours 10/25/20 1022 10/30/20 1103   10/23/20 2000  vancomycin (VANCOREADY) IVPB 750 mg/150 mL  Status:  Discontinued        750 mg 150 mL/hr over 60 Minutes Intravenous Every 24 hours 10/22/20 2036 10/25/20 1022   10/23/20 1800  piperacillin-tazobactam (ZOSYN) IVPB 3.375 g  Status:  Discontinued        3.375 g 100 mL/hr over 30 Minutes Intravenous Every 6 hours 10/23/20 1155 10/24/20 1422   10/23/20 0200  piperacillin-tazobactam (ZOSYN) IVPB 3.375 g  Status:  Discontinued        3.375 g 100 mL/hr over 30 Minutes Intravenous Every 8 hours 10/22/20 2036 10/23/20 1155   10/22/20 1630  vancomycin (VANCOREADY) IVPB 1500 mg/300 mL        1,500 mg 150 mL/hr over 120 Minutes Intravenous  Once 10/22/20 1537 10/22/20 2229   10/22/20 1630  piperacillin-tazobactam (ZOSYN) IVPB 2.25 g  Status:  Discontinued        2.25 g 100 mL/hr over 30 Minutes Intravenous Every 8 hours 10/22/20 1537 10/22/20 2036   10/22/20 1537  vancomycin variable dose per unstable renal function (pharmacist dosing)  Status:  Discontinued         Does not apply See admin instructions 10/22/20 1537 10/22/20 2036   10/19/20 0600  ceFAZolin (ANCEF) IVPB 2g/100 mL premix        2 g 200 mL/hr over 30 Minutes Intravenous To Radiology 10/18/20 1457 10/19/20 0938   10/10/20 0830  vancomycin (VANCOCIN) IVPB 1000 mg/200 mL premix       "Followed by" Linked Group Details   1,000 mg 200 mL/hr over 60 Minutes Intravenous Every 24 hours 10/09/20 0744 10/15/20 0902   10/09/20 0830  piperacillin-tazobactam (ZOSYN) IVPB 3.375 g        3.375 g 100 mL/hr over 30 Minutes Intravenous Every 6 hours 10/09/20 0744 10/16/20 0038   10/09/20 0830  vancomycin (VANCOREADY) IVPB 2000  mg/400 mL       "Followed by" Linked Group Details   2,000 mg 200 mL/hr over 120 Minutes Intravenous  Once 10/09/20 0744 10/09/20 1022   09/24/20 1000  ceFAZolin (ANCEF) IVPB 2g/100 mL premix  Status:  Discontinued        2 g 200 mL/hr over 30 Minutes Intravenous Every 12 hours 09/24/20 0801 09/24/20 1046   09/23/20 1400  vancomycin (VANCOCIN) IVPB 1000 mg/200 mL premix        1,000 mg 200 mL/hr over 60 Minutes Intravenous Every 24 hours 09/22/20 1436 09/28/20 1718   09/22/20 2200  ceFEPIme (MAXIPIME) 2 g in sodium chloride 0.9 % 100 mL IVPB  Status:  Discontinued        2 g 200 mL/hr over 30 Minutes Intravenous Every 12 hours 09/22/20 1436 09/24/20 0801   09/22/20 1030  ceFEPIme (MAXIPIME) 1 g in sodium chloride 0.9 % 100 mL IVPB        1 g 200 mL/hr over 30 Minutes Intravenous  Once 09/22/20 0934 09/22/20 1145   09/22/20 1015  vancomycin (VANCOCIN) IVPB 1000 mg/200 mL premix        1,000 mg 200 mL/hr over 60 Minutes Intravenous  Once 09/22/20 0934 09/22/20 1446   09/12/20 2200  ceFEPIme (MAXIPIME) 2 g in sodium chloride 0.9 % 100 mL IVPB        2 g 200 mL/hr over 30 Minutes Intravenous  Every 12 hours 09/12/20 0732 09/14/20 2134   09/11/20 1400  ceFEPIme (MAXIPIME) 2 g in sodium chloride 0.9 % 100 mL IVPB  Status:  Discontinued        2 g 200 mL/hr over 30 Minutes Intravenous Every 8 hours 09/11/20 1126 09/12/20 0732   09/08/20 1000  vancomycin (VANCOREADY) IVPB 2000 mg/400 mL        2,000 mg 200 mL/hr over 120 Minutes Intravenous  Once 09/08/20 0857 09/08/20 1224   09/08/20 1000  ceFEPIme (MAXIPIME) 2 g in sodium chloride 0.9 % 100 mL IVPB  Status:  Discontinued        2 g 200 mL/hr over 30 Minutes Intravenous Every 12 hours 09/08/20 0857 09/11/20 1126   09/08/20 0856  vancomycin variable dose per unstable renal function (pharmacist dosing)  Status:  Discontinued         Does not apply See admin instructions 09/08/20 0857 09/09/20 0935   09/02/20 1600  cefTRIAXone (ROCEPHIN) 1 g in  sodium chloride 0.9 % 100 mL IVPB  Status:  Discontinued        1 g 200 mL/hr over 30 Minutes Intravenous Every 24 hours 09/01/20 1811 09/02/20 0838   09/01/20 1800  fluconazole (DIFLUCAN) IVPB 400 mg        400 mg 50 mL/hr over 240 Minutes Intravenous  Once 09/01/20 1749 09/02/20 0603   09/01/20 1530  piperacillin-tazobactam (ZOSYN) IVPB 3.375 g        3.375 g 12.5 mL/hr over 240 Minutes Intravenous Every 8 hours 09/01/20 1514 09/05/20 2111   09/01/20 1000  levofloxacin (LEVAQUIN) tablet 250 mg  Status:  Discontinued        250 mg Oral Daily 08/31/20 1508 08/31/20 1735   08/31/20 1730  cefTRIAXone (ROCEPHIN) 1 g in sodium chloride 0.9 % 100 mL IVPB  Status:  Discontinued        1 g 200 mL/hr over 30 Minutes Intravenous Every 24 hours 08/31/20 1726 09/01/20 1513   08/31/20 1730  azithromycin (ZITHROMAX) 500 mg in sodium chloride 0.9 % 250 mL IVPB  Status:  Discontinued        500 mg 250 mL/hr over 60 Minutes Intravenous Every 24 hours 08/31/20 1726 09/02/20 0838       Time spent: 40 minutes    [name] ANP  [facility] 7 am - 330 pm/M-F for direct patient care and secure chat Please refer to Amion for contact info 105  days

## 2020-12-15 NOTE — Progress Notes (Signed)
Physical Therapy Treatment Patient Details Name: Candace [name] MRN: [id number] DOB: 08/12/1951 Today's Date: 12/15/2020    History of Present Illness Pt 70 y.o. female with medical history significant for recent covid pna, obesity, colon cancer s/p colon resection with colostomy bag, HLD, NIDDM2, CKD3 presented to ED after her follow-up nurse advised her to present to ED for elevated HR. +afib, elevated troponins with demand ischemia,  CT scan is positive for bowel perforation with pneumomediastinum 11/03 exp lap with repair of perforated ulcer. 11/10 t/f to ICU- intubated/sedated/proned. Trach placed 11/24. CRRT off 12/8, restarted 12/10-1/5. 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    PT Comments    Continuing work on functional mobility and activity tolerance;  Session focused on getting OOB to the recliner, with hopes of better tolerance of being OOB in the recliner with use of the Roho cushion in the recliner; Used Maximove for OOB to recliner transfer, and worked on trunk mobility and stability including anterior weight shifts and trunk rotation; Consider working on weights shifts onto feet on floor or feet on stedy from recliner next session   Follow Up Recommendations  [facility] bed;Other (comment) (hoyer lift, custom wheelchair, air mattress overlay)    Recommendations for Other Services       Precautions / Restrictions Precautions Precautions: Fall Precaution Comments: baseline R colostomy; sacral wound with wound vac, trach capped Required Braces or Orthoses: Other Brace Other Brace: B prafos Restrictions Weight Bearing Restrictions: No    Mobility  Bed Mobility Overal bed mobility: Needs Assistance Bed Mobility: Rolling Rolling: Mod assist;+2 for physical assistance;+2 for safety/equipment     Sit to supine: Total assist;+2 for physical assistance;+2  for safety/equipment   General bed mobility comments: Mod A for rolling side to side for placement of maximove pad    Transfers Overall transfer level: Needs assistance Equipment used: Ambulation equipment used             General transfer comment: maximove for bed to chair transfer  Ambulation/Gait                 Stairs             Wheelchair Mobility    Modified Rankin (Stroke Patients Only)       Balance Overall balance assessment: Needs assistance Sitting-balance support: Bilateral upper extremity supported;Feet unsupported Sitting balance-Leahy Scale: Zero Sitting balance - Comments: in recliner chair, worked on pulling self from supported sitting to unsupported sitting; Reaching for daughter in front of her, tehn reaching sideways R and L with UE support                                    Cognition Arousal/Alertness: Awake/alert Behavior During Therapy: WFL for tasks assessed/performed Overall Cognitive Status: Impaired/Different from baseline Area of Impairment: Following commands;Problem solving                       Following Commands: Follows one step commands with increased time Safety/Judgement: Decreased awareness of safety;Decreased awareness of deficits Awareness: Emergent Problem Solving: Slow processing General Comments: difficulty pinpointing positional changes, comfort and pain level with adjustment attempts      Exercises      General Comments General comments (skin integrity, edema, etc.): Daughter present and assisting  throughout. Guided pt in trunk rotations and pulling forward in recliner chair to increase independence for positioning self. On exit, pt comfortable in chair, fatigued and dozing off.      Pertinent Vitals/Pain Pain Assessment: Faces Faces Pain Scale: Hurts little more Pain Location: bottom Pain Descriptors / Indicators: Guarding;Grimacing;Sore;Crying Pain Intervention(s):  Premedicated before session;Monitored during session    Home Living                      Prior Function            PT Goals (current goals can now be found in the care plan section) Acute Rehab PT Goals Patient Stated Goal: daughter would like her OOB PT Goal Formulation: With patient/family Time For Goal Achievement: 12/28/20 Potential to Achieve Goals: Fair Progress towards PT goals: Progressing toward goals    Frequency    Min 2X/week      PT Plan Current plan remains appropriate    Co-evaluation   Reason for Co-Treatment: Complexity of the patient's impairments (multi-system involvement);For patient/therapist safety;To address functional/ADL transfers   OT goals addressed during session: ADL's and self-care      AM-PAC PT "6 Clicks" Mobility   Outcome Measure  Help needed turning from your back to your side while in a flat bed without using bedrails?: Total Help needed moving from lying on your back to sitting on the side of a flat bed without using bedrails?: Total Help needed moving to and from a bed to a chair (including a wheelchair)?: Total Help needed standing up from a chair using your arms (e.g., wheelchair or bedside chair)?: Total Help needed to walk in hospital room?: Total Help needed climbing 3-5 steps with a railing? : Total 6 Click Score: 6    End of Session         PT Visit Diagnosis: Muscle weakness (generalized) (M62.81);Difficulty in walking, not elsewhere classified (R26.2);Pain;Adult, failure to thrive (R62.7);Unsteadiness on feet (R26.81) Pain - Right/Left: Right Pain - part of body: Knee     Time: 1246-1340 PT Time Calculation (min) (ACUTE ONLY): 54 min  Charges:  $Therapeutic Activity: 23-37 mins                     Roney Marion, PT  Acute Rehabilitation Services Pager [phone] Office [place] 12/15/2020, 3:59 PM

## 2020-12-16 DIAGNOSIS — I4891 Unspecified atrial fibrillation: Secondary | ICD-10-CM | POA: Diagnosis not present

## 2020-12-16 LAB — CBC
HCT: 30.9 % — ABNORMAL LOW (ref 36.0–46.0)
Hemoglobin: 9.2 g/dL — ABNORMAL LOW (ref 12.0–15.0)
MCH: 26.1 pg (ref 26.0–34.0)
MCHC: 29.8 g/dL — ABNORMAL LOW (ref 30.0–36.0)
MCV: 87.5 fL (ref 80.0–100.0)
Platelets: 305 10*3/uL (ref 150–400)
RBC: 3.53 MIL/uL — ABNORMAL LOW (ref 3.87–5.11)
RDW: 19.7 % — ABNORMAL HIGH (ref 11.5–15.5)
WBC: 13.4 10*3/uL — ABNORMAL HIGH (ref 4.0–10.5)
nRBC: 0.2 % (ref 0.0–0.2)

## 2020-12-16 LAB — GLUCOSE, CAPILLARY
Glucose-Capillary: 214 mg/dL — ABNORMAL HIGH (ref 70–99)
Glucose-Capillary: 216 mg/dL — ABNORMAL HIGH (ref 70–99)
Glucose-Capillary: 236 mg/dL — ABNORMAL HIGH (ref 70–99)
Glucose-Capillary: 238 mg/dL — ABNORMAL HIGH (ref 70–99)
Glucose-Capillary: 277 mg/dL — ABNORMAL HIGH (ref 70–99)
Glucose-Capillary: 281 mg/dL — ABNORMAL HIGH (ref 70–99)

## 2020-12-16 LAB — RENAL FUNCTION PANEL
Albumin: 2.4 g/dL — ABNORMAL LOW (ref 3.5–5.0)
Anion gap: 10 (ref 5–15)
BUN: 71 mg/dL — ABNORMAL HIGH (ref 8–23)
CO2: 19 mmol/L — ABNORMAL LOW (ref 22–32)
Calcium: 11.3 mg/dL — ABNORMAL HIGH (ref 8.9–10.3)
Chloride: 104 mmol/L (ref 98–111)
Creatinine, Ser: 0.83 mg/dL (ref 0.44–1.00)
GFR, Estimated: >60 mL/min (ref >60)
Glucose, Bld: 263 mg/dL — ABNORMAL HIGH (ref 70–99)
Phosphorus: 3.1 mg/dL (ref 2.5–4.6)
Potassium: 4.5 mmol/L (ref 3.5–5.1)
Sodium: 133 mmol/L — ABNORMAL LOW (ref 135–145)

## 2020-12-16 MED ORDER — GUAIFENESIN 200 MG PO TABS
400.0000 mg | ORAL_TABLET | Freq: Two times a day (BID) | ORAL | Status: DC
  Administered 2020-12-17 – 2020-12-23 (×12): 400 mg via ORAL
  Filled 2020-12-16 (×15): qty 2

## 2020-12-16 MED ORDER — OXYMETAZOLINE HCL 0.05 % NA SOLN
1.0000 | Freq: Two times a day (BID) | NASAL | Status: AC
  Administered 2020-12-16 – 2020-12-18 (×5): 1 via NASAL
  Filled 2020-12-16: qty 30

## 2020-12-16 MED ORDER — FUROSEMIDE 10 MG/ML IJ SOLN
40.0000 mg | Freq: Four times a day (QID) | INTRAMUSCULAR | Status: AC
  Administered 2020-12-16 – 2020-12-17 (×4): 40 mg via INTRAVENOUS
  Filled 2020-12-16 (×4): qty 4

## 2020-12-16 MED ORDER — SODIUM CHLORIDE 0.9 % IV SOLN: INTRAVENOUS | Status: AC

## 2020-12-16 MED ORDER — ACETAMINOPHEN 325 MG PO TABS
650.0000 mg | ORAL_TABLET | ORAL | Status: DC | PRN
  Administered 2020-12-16 – 2020-12-17 (×2): 650 mg via ORAL
  Filled 2020-12-16 (×2): qty 2

## 2020-12-16 MED ORDER — GUAIFENESIN 100 MG/5ML PO SOLN
15.0000 mL | Freq: Two times a day (BID) | ORAL | Status: DC
  Administered 2020-12-16 – 2020-12-19 (×4): 300 mg
  Filled 2020-12-16 (×9): qty 15

## 2020-12-16 NOTE — Progress Notes (Signed)
12/16/2020 PT Note:  I came by and spoke with Ms. Rosenburg and her daughter, Roland Rack re: a more solid therapy plan to cover 5 days a week and to attempt to coordinate better with hydro therapy (to come before hydro therapy as she has a low tolerance due to pain of mobility and OOB to chair after hydro therapy).  I explained that for patients going home PT normally comes three times per week and OT comes 2 times per week, so we are going to split up so we can see her 5 days per week with no gaps.  PT is going to come at around 9:30 am tomorrow 10/16/21, call to see if pt needs pain meds before mobility and plan to get her up to the chair for a few hours.  Hydro therapy is going to attempt to come around 1300 hours. Roland Rack is agreeable to this plan and so was Ms. Sokol.  PT to check in with RN staff in the AM to relay this plan.     Thanks,  Verdene Lennert, PT, DPT  Acute Rehabilitation [phone] pager [phone] office

## 2020-12-16 NOTE — Progress Notes (Addendum)
[facility] PROGRESS NOTE   Candace [name] MLJ:[id number] DOB: [birth year] DOA: 08/31/2020 PCP: Algis Greenhouse, MD            Status: Remains inpatient appropriate because:Ongoing diagnostic testing needed not appropriate for outpatient work up, Unsafe d/c plan, IV treatments appropriate due to intensity of illness or inability to take PO and Inpatient level of care appropriate due to severity of illness   Dispo: The patient is from: Home              Anticipated d/c is to: Home with home health               Anticipated d/c date is: > 3 days              Patient currently is not medically stable to d/c.  Barriers to discharge: Continues to require HD- unable to sit in chair/get in [facility] for OP HD-also requiring Cortrak for nocturnal feeds; still has trach with copiuous secretions at times  Considerations for home discharge: 1. Patient will need to continue wound VAC and current wound VAC is apparently not available in the home setting 2. It is likely she will need a specialty bed.  We are currently transitioning back to the [place] rotational bed which will allow for better in bed mobility including the ability to "stand" with PT in the bed without actually getting patient out of bed 3. Likely would need to be decannulated to minimize other care issues after discharge 4. Would need to be able to mobilize for outpatient hemodialysis 5. Would not have aggressive PT OT available in the home setting that she can achieve during hospitalization 6. Patient will have family members that are CNAs managing her after discharge but this would be 24/7 requirement and this could be an overwhelming issue once patient is actually home  Code Status: Full Family Communication: 2/16 daughter [name] at bedside DVT prophylaxis: Eliquis Vaccination status: Has not been vaccinated but did have severe COVID infection initially diagnosed on 08/23/2020.  Patient will be eligible for COVID-vaccine after  11/23/2020   Foley catheter: No, purewick female urinary collection device   HPI: 70 year old female patient with prior history of diabetes, stage III chronic kidney disease, atrial fibrillation, hypertension, dyslipidemia and colon cancer that is post colectomy and colostomy.  Initially diagnosed with COVID on 08/23/20 she was admitted to the hospital due to dehydration, acute kidney injury and hypoxemic respiratory failure.  She was discharged home on 2 to 3 L of oxygen after being treated with IV steroids and remdesivir.  During that time although she had elevated inflammatory markers she had no embolic or thrombotic disease.  She had been on prophylactic dose heparin during the hospitalization and was placed on Eliquis for 2 weeks for after discharge  Patient returned to the ER on 08/31/2020 for complaints of not feeling well.  She was found to be in atrial fibrillation with RVR, hemorrhagic shock and work-up revealed a perforated duodenal ulcer.  She underwent exploratory laparotomy on 11/3.  She has had an extensive ICU stay with significant events as below.   11/2 Admitted with rapid Afib 11/3 OR with findings of perforated duodenal ulcer 11/10 progressive hemorrhagic shock, intubated, transfused, pressors, proned; started on CRRT in PM 11/16 Extubated. Re-intubated overnight due to respiratory distress and hypoxia with decreased mentation 11/18 Bronch, cultures sent 11/19 Hgb down getting blood 11/24 Spiked fever, resume empirical antimicrobial therapy 11/26 Hemorrhagic shock, hgb 5.6, increased pressors,  11/30 Per palliative "  [name] understanding that patient is unfortunately very fragiledespite ongoing intensive medical treatment and full mechanical support. [name] that the familywantsto continue with all current interventions despite potential outcomes". 12/08 CRRT discontinued due to clotting 12/09 Family requested transfer to tertiary care [facility] denied  transfer  12/10 CRRT restarted. Episodes of tachycardia, tachypnea that seem to improve with pain management 12/11 Back in shock. Pressor requirements up. CXR worse. ABX resumed 12/12 Still requiring inc pressors. Had hypoglycemic event. Basal insulin dosing adjusted 12/13 Pressor requirements better. Now hyperglycemic. Re-adjusted Glycemic control  12/19 Afebrile . Remains on dilaudid and heparin gtt, dilaudid gtt increased overnight for concern of pain / ongoing tachycardia, no other events . NE and precedex off 12/17. Ongoing CRRT- even UF, dosing lokelmia this morning 12/20 On CRRT. Renal plans for HD cath and moving to HD. Getting wound care 12/24 - renal stopping CRRT today and plans HD 10/24/20 . 40% fio2 on vent via Trach. TAchypenic and tachycardic. Afebrile but wbc up to 27.6K. On TF. On heparin gtt 12/25 - Back on CRRT. On vent via trach at 40% fio2, On scheduled dilaudid as add on to oxy. Per family request 12/24 - they felt scheduled oxy was not adequate and patient was showing signs of opioid withdrawal.  Patient also had worsening SIRS/sepsis syndrome. Had fever, rising wbc, worsening RR 40-60 and HR 140s sinus->started On abx yesterday. Fever some better today. WBC plateau at 28,.5K. On new levophed ->signifanct improvement in HR 77 and RR t0 20. On heparin gtt. On precedex gtt. On levophed gtt 9mg wthi midodrine. On TF 12/30 Remains on CRRT with intermittent pressor requirements. Ethics consult placed 12/29. Ethics rec time trial of CRRT 12/31 failed SBT with RR 40s. Several conversations between care tam and family, who are upset RE plan of care 1/1 back on pressors  1/4: On pressors, keeping even on CVVHD, HGB drop to 6.9, transfused 1 unit. Improving mental status 1/10 remains on low dose levophed 234m 1/11 off levo, attempting HD with UF for first time. Now tolerating intermittent HD. 1/19 patient transferred to progressive bed with tracheostomy on 35% FiO2.  has NG tube  feeding.  No PEG tube.  Received dialysis on 1/18. 1/24 significant improvement and persistent tachycardia after introduction of twice daily beta-blocker.  Heart rates have improved from the 130s to the 98-104 range 1/24 core track clogged and therefore has been removed by nutrition team.  Calorie count in progress and we are weaning any sedating medications which could be contributing to patient's inability to eat. 1/27 continue w/ cuff #6 trach (eventually downsized to #4) 2/16 Decannulated  Subjective: Awake.  Reporting less discomfort in ears.  Agreeable to trial of Afrin nasal spray to see if this will help reduce eustachian tube pressure and therefore further improved discomfort in ears.  Daughter at bedside.  Significant time spent discussing with her regarding her concerns over how direct patient care is delivered to her mother.  Previously we had discussed the possibility of transferring to a different progressive care unit and at this time she agrees.  She is also requesting patient potentially be transferred to another hospital as another option.  [facility] Explained to daughter that although we can transfer patients for family request given the current emergency status with Covid most hospitals have been deferring transfers from outside hospital unless absolutely medically necessary and unfortunately the requirements for her mother's care can be delivered at this facility.   Objective: Vitals:  12/16/20 0500 12/16/20 0731  BP: 115/80 133/86  Pulse: (!) 118 (!) 120  Resp: 18 20  Temp: 98.1 F (36.7 C) 97.9 F (36.6 C)  SpO2: 95% 100%    Intake/Output Summary (Last 24 hours) at 12/16/2020 0739 Last data filed at 12/16/2020 9357 Gross per 24 hour  Intake 493 ml  Output 2025 ml  Net -1532 ml   Filed Weights    Exam: Constitutional: Awake and alert, smiling Respiratory: Lungs clear without any increased work of breathing.  Tracheostomy tube has been removed  and patient is currently stable on room air. Cardiovascular: S1-S2, continues with some mild resting tachycardia usually precipitated by pain, no peripheral edema-low rate IV fluids in place as ordered by nephrology team Abdomen: On regular diet with nectar thick liquids- calorie count in progress- LBM 2/15-cortrack for nocturnal tube feedings Skin:  Massive sacral decubitus ulcer-wound VAC with serosanguineous drainage noted in collection system Neurologic: CN 2-12 grossly intact. Sensation intact, DTR normal. Strength 1/5 x all 4 extremities.  Psychiatric: Alert and oriented x3, pleasant affect.   Assessment/Plan: Acute problems: PAF maintaining sinus rhythm w/persistent tachycardia -Continue metoprolol to 37.5 mg BID -Echocardiogram this admission with an EF 50 to 55% with mild diastolic dysfunction parameters and normal RV systolic function -Continue Eliquis-benefits coordinator has determined that Eliquis is covered at [facility] in [place] with a co-pay of $47  Acute respiratory failure secondary to COVID-pneumonia/tracheostomy/acute MSSA tracheobronchitis -Patient stable on low-flow oxygen FiO2 21% -2/16 decannulated -Completed IV Ancef for MSSA tracheobronchitis  Right ear pain/possible impacted cerumen -Evaluated by attending physician over the weekend and was noted to have significant amount of cerumen in ear.  -Started on Debrox 10 drops 2 times daily but continues to have ear pain and now complaining of sore throat -As of 2/17 pain has improved but will add Afrin drops to further decrease eustachian tube pressure.  Acute kidney injury secondary to COVID-related sepsis with shock superimposed on stage III chronic kidney disease -Continue intermittent hemodialysis as directed by the renal team -Avoid nephrotoxic medications -Continue Aranesp on dialysis days -Creatinine has normalized with renal attributing this to marked decrease in muscle mass from critical illness.  Plan is to  follow labs and monitor urine output. -Nephrology team remains hopeful of renal recovery.  Last HD treatment was 2/12 with removal of 1.4 L-as of today creatinine down to 0.77 with elevated BUN -Has not required dialysis since 2/12 therefore has not been given any midodrine.  Pressure stable and actually improved after administration of IV fluids.  Patient does have elevated BUN and likely is somewhat volume depleted.  She does make sincere efforts to drink plenty of fluids by mouth. -Also receiving IV fluids briefly to treat hypercalcemia  Lower extremity muscle pain -Continue Voltaren gel.  Patient clarifies this is not a cramping intermittent pain but a constant pain. -Continue PROM and AROM -Family to bring in tennis shoes to wear in an alternating schedule to help improve foot flexion noting this improves her leg pain  Anxiety -Vistaril prn added by attending on 2/10-added administration instruction to give at least 30 minutes prior to dialysis treatment at request of daughter -Patient reminded of availability of as needed Vistaril and has been encouraged to use as described above  Dysphagia/moderate to severe protein calorie malnutrition Nutrition Status: Nutrition Problem: Increased nutrient needs Etiology: wound healing Signs/Symptoms: estimated needs Interventions: Refer to RD note for recommendations  Estimated body mass index is 31.04 kg/m as calculated from the following:  Height as of this encounter: 5' 8"  (1.727 m).   Weight as of this encounter: 92.6 kg.  -Continue regular diet with nectar thick liquids -Continue nocturnal tube feedings -Continue Megace and Nepro shakes orally -SLP evaluation 2/13 with recommendation to change to intermittent supervision with meals -2/14 appropriate meds changed to crush and administer with applesauce  Acute hemorrhagic shock secondary to perforated duodenal ulcer/anemia of critical illness (initial reason for admission) -Stable  postoperatively -Hemoglobin remains stable greater than 8.0  Diabetes mellitus 2 -Patient has been transition to regular diet along with tube feedings and we have noted significant increase in CBG readings -CBGs increasing as PO intake increase-2/15 will add Levemir 5 units BID -HgbA1c 08/24/2020 and was 7.0 -Prior to admission patient was on metformin XR-unable to use at this juncture due to severity of kidney disease -we are hopeful for recovery of renal function  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 -SNF recommended but family wants to take patient home -Patient continues to make small but impressive improvements in mobility daily -Patient now has a power wheelchair which she has been able to utilize as of last week-does require Hoyer lift to transfer -Family request no SNF rehab due to pt prior request to NEVER be placed in any type of facility -Continue PROM every 4 hours-patient encouraged to perform independent AROM as well -2/3 order placed for repositioning every 2 hours -2/11 continue therabands to siderails for UE resistance training -OOB to electric WC (2nd time) on 2/15 and tolerated well -On 2/17 discussed with daughter possible consideration for LTAC but would only reach out to the team if nephrology determines patient no longer a candidate for hemodialysis noting that on LTAC the requirement is the same as an SNF and she must be able to stay up in a chair for the entire dialysis session.  Stage IV sacral decubitus -Not present on admission -Wound care nurse following-continue now has wound VAC and hydrotherapy -Surgical team consulted.  Current recommendation is against pursuing general anesthesia to undergo extensive surgical procedure-hopefully can be reevaluated in the future if wound does not heal adequately.  As of 1/28 they have nothing further acutely to add but will be following peripherally. -Continue [place] rotational bed   -Continue oxycodone for pain  -Continue pain assessment every 2 hours   -  11/24/2020 after [facility]      11/22/2020                       11/29/2020     12/06/2020                             12/13/2020   Incision (Closed) 09/01/20 Abdomen (Active)  Date First Assessed/Time First Assessed: 09/01/20 1737   Location: Abdomen    Assessments 09/01/2020  6:25 PM 12/14/2020  8:00 AM  Dressing Type Gauze (Comment) Gauze (Comment)  Dressing -- Clean;Dry;Intact  Site / Wound Assessment Dressing in place / Unable to assess Dressing in place / Unable to assess  Drainage Amount None --     No Linked orders to display     Pressure Injury 09/17/20 Ear Left;Anterior;Posterior Stage 2 -  Partial thickness loss of dermis presenting as a shallow open injury with a red, pink wound bed without slough. (Active)  Date First Assessed/Time First Assessed: 09/17/20 0800   Location: Ear  Location Orientation: Left;Anterior;Posterior  Staging: Stage 2 -  Partial thickness loss of dermis presenting  as a shallow open injury with a red, pink wound bed without slough. ...    Assessments 09/17/2020  8:00 AM 12/05/2020  8:00 PM  Dressing Type Foam - Lift dressing to assess site every shift None  Dressing Clean;Dry;Intact --  Dressing Change Frequency Every 3 days --  Site / Wound Assessment Dry;Pink --  Peri-wound Assessment Intact --  Wound Length (cm) 2 cm --  Wound Width (cm) 1 cm --  Wound Depth (cm) 0.25 cm --  Wound Surface Area (cm^2) 2 cm^2 --  Wound Volume (cm^3) 0.5 cm^3 --  Margins Unattached edges (unapproximated) --  Drainage Amount Scant --  Drainage Description Serosanguineous --  Treatment Cleansed --     No Linked orders to display     Pressure Injury 09/25/20 Sacrum Bilateral;Medial Deep Tissue Pressure Injury - Purple or maroon localized area of discolored intact skin or blood-filled blister due to damage of underlying soft tissue from pressure and/or shear. Purple, non-blanchable, b (Active)  Date  First Assessed/Time First Assessed: 09/25/20 2000   Location: Sacrum  Location Orientation: Bilateral;Medial  Staging: Deep Tissue Pressure Injury - Purple or maroon localized area of discolored intact skin or blood-filled blister due to damage o...    Assessments 09/25/2020  5:00 PM 12/15/2020 12:47 PM  Dressing Type Foam - Lift dressing to assess site every shift Barrier Film (skin prep);Negative pressure wound therapy  Dressing Changed;Clean;Dry;Intact Changed  Dressing Change Frequency -- Monday, Wednesday, Friday  State of Healing -- Early/partial granulation  Site / Wound Assessment -- Pink;Yellow;Black;Granulation tissue;Red  % Wound base Red or Granulating -- 50%  % Wound base Yellow/Fibrinous Exudate -- 45%  % Wound base Black/Eschar -- 5%  % Wound base Other/Granulation Tissue (Comment) -- 0%  Peri-wound Assessment -- Intact  Margins -- Unattached edges (unapproximated)  Drainage Amount -- Minimal  Drainage Description -- Sanguineous  Treatment -- Debridement (Selective);Negative pressure wound therapy     No Linked orders to display     Negative Pressure Wound Therapy Sacrum (Active)  Placement Date/Time: 11/22/20 1500   Wound Type: (c) Other (Comment)  Location: Sacrum    Assessments 11/22/2020  4:59 PM 12/16/2020  6:46 AM  Last dressing change 11/22/20 --  Site / Wound Assessment Granulation tissue;Pale;Yellow Clean;Dry;Black;Pink  Peri-wound Assessment Intact Intact  Size see above --  Wound filler - Black foam 2 --  Wound filler - White foam 0 --  Wound filler - Nonadherent 0 --  Wound filler - Gauze 0 --  Cycle Continuous --  Target Pressure (mmHg) 125 --  Instillation Volume 26 mL --  Instillation Solution Normal Saline --  Instillation Soak Time 10 minutes --  Instillation Therapy Time 3.5 hours --  Canister Changed No --  Dressing Status Intact Intact  Drainage Amount None None  Output (mL) -- 105 mL     No Linked orders to display     Other  problems: Coag neg staph-1/2 blood cultures positive -dw ID pharmacist- cultures c/w contamination Vancomycin ordered overnight stopped on 2/8  Acute encephalopathy secondary to prolonged hospital stay -Resolved  History of stage IV colon cancer -Old colostomy  Acute encephalopathy with ICU delirium -Delirium has resolved and supportive meds of Klonopin and Seroquel have been weaned and discontinued  Hypomagnesemia -Continue oral magnesium -1/21 give 4 g magnesium IV x1 -Follow labs  DVT bilateral posterior tibial veins -Was not present during initial COVID admission -Continue Eliquis for now given severe debility and increased risk for developing recurrent DVT  Hypertension -Having issues with orthostasis requiring midodrine Prior to admission patient was on Norvasc  Dyslipidemia -Prior to admission patient was on Crestor-consider resuming soon once patient can swallow pills whole  Abnormal TSH -Nov 2021 TSH was 0.015-TSH and free T4, T3 normal this admission  Data Reviewed: Basic Metabolic Panel: Recent Labs  Lab 12/11/20 0643 12/12/20 1100 12/13/20 0119 12/14/20 0746 12/15/20 0101 12/16/20 0147  NA 140 131* 132* 133* 134* 133*  K 2.6* 4.2 4.5 4.3 4.8 4.5  CL 113* 99 99 106 104 104  CO2 18* 20* 21* 17* 20* 19*  GLUCOSE 103* 220* 234* 281* 197* 263*  BUN 66* 76* 90* 79* 76* 71*  CREATININE 0.64 1.05* 1.00 0.86 0.77 0.83  CALCIUM 8.3* 10.2 10.0 10.7* 11.5* 11.3*  MG 1.2*  --   --   --   --   --   PHOS  --  4.1 4.1 3.1 2.9 3.1   Liver Function Tests: Recent Labs  Lab 12/11/20 0643 12/12/20 1100 12/13/20 0119 12/14/20 0746 12/15/20 0101 12/16/20 0147  AST 15  --   --   --   --   --   ALT 13  --   --   --   --   --   ALKPHOS 63  --   --   --   --   --   BILITOT 0.4  --   --   --   --   --   PROT 5.8*  --   --   --   --   --   ALBUMIN 1.8* 2.5* 2.4* 2.6* 2.6* 2.4*   No results for input(s): LIPASE, AMYLASE in the last 168 hours. No results for  input(s): AMMONIA in the last 168 hours. CBC: Recent Labs  Lab 12/09/20 0903 12/11/20 0643 12/16/20 0147  WBC 16.5* 15.7* 13.4*  HGB 8.1* 7.9* 9.2*  HCT 26.8* 25.1* 30.9*  MCV 87.9 85.7 87.5  PLT 443* 283 305   Cardiac Enzymes: No results for input(s): CKTOTAL, CKMB, CKMBINDEX, TROPONINI in the last 168 hours. BNP (last 3 results) Recent Labs    09/07/20 0118 09/08/20 0446 09/09/20 0428  BNP 216.8* 432.5* 609.7*    ProBNP (last 3 results) No results for input(s): PROBNP in the last 8760 hours.  CBG: Recent Labs  Lab 12/15/20 1134 12/15/20 1641 12/15/20 2028 12/16/20 0013 12/16/20 0503  GLUCAP 135* 210* 255* 238* 281*    No results found for this or any previous visit (from the past 240 hour(s)).   Studies: No results found.  Scheduled Meds: . acetaminophen (TYLENOL) oral liquid 160 mg/5 mL  650 mg Oral Q6H  . apixaban  2.5 mg Oral BID  . carbamide peroxide  10 drop Both EARS BID  . chlorhexidine gluconate (MEDLINE KIT)  15 mL Mouth Rinse BID  . Chlorhexidine Gluconate Cloth  6 each Topical Q0600  . collagenase   Topical BID  . darbepoetin (ARANESP) injection - DIALYSIS  100 mcg Intravenous Q Sat-HD  . diclofenac Sodium  2 g Topical QID  . feeding supplement (NEPRO CARB STEADY)  237 mL Oral TID BM  . feeding supplement (NEPRO CARB STEADY)  780 mL Per Tube Q24H  . guaiFENesin  15 mL Oral Q12H  . insulin aspart  0-5 Units Subcutaneous QHS  . insulin aspart  0-6 Units Subcutaneous TID WC  . insulin detemir  5 Units Subcutaneous BID  . mouth rinse  15 mL Mouth Rinse QID  . megestrol  200 mg Oral QID  . metoprolol tartrate  37.5 mg Oral BID  . nutrition supplement (JUVEN)  1 packet Per Tube BID BM  . nystatin   Topical BID  . pantoprazole sodium  40 mg Oral Daily  . sodium chloride  2 spray Each Nare Q6H  . sodium chloride flush  10-40 mL Intracatheter Q12H   Continuous Infusions: . albumin human 25 g (11/16/20 1516)    Principal Problem:   Atrial  fibrillation with RVR (HCC) Active Problems:   Acute respiratory failure due to COVID-19 [facility])   New onset a-fib (HCC)   Leukocytosis   Atrial fibrillation with rapid ventricular response (HCC)   Hypoxia   Pressure injury of skin   Acute hypoxemic respiratory failure (HCC)   ARDS (adult respiratory distress syndrome) (HCC)   Perforated duodenal ulcer ([place])   On mechanically assisted ventilation ([place])   Palliative care by specialist   Goals of care, counseling/discussion   Shock ([place])   Acute and chronic respiratory failure (acute-on-chronic) ([place])   Status post tracheostomy ([place])   ESRD (end stage renal disease) ([place])   HAP (hospital-acquired pneumonia)   Consultants:  Cardiology  Surgery  Nephrology  Ethics  PCCM   Procedures: R PICC 11/5 >> A line 11/9 >> out ETT 11/9 > 11/16, 11/16 >> 09/21/2020, 09/21/2020 tracheostomy>> Lt [place] CVL 11/9 >> R IJ trialysis >> out HD catheter 12/1 >>12/20 12/21 - 14.5 Fr, 23 cm right IJ tunneled hemodialysis catheter placement. Removal of indwelling subclavian catheter.   Antibiotics: Anti-infectives (From admission, onward)   Start     Dose/Rate Route Frequency Ordered Stop   12/07/20 1200  vancomycin (VANCOCIN) IVPB 1000 mg/200 mL premix  Status:  Discontinued        1,000 mg 200 mL/hr over 60 Minutes Intravenous Every T-Th-Sa (Hemodialysis) 12/06/20 2156 12/07/20 1113   12/06/20 2245  vancomycin (VANCOREADY) IVPB 2000 mg/400 mL        2,000 mg 200 mL/hr over 120 Minutes Intravenous  Once 12/06/20 2156 12/07/20 0211   11/27/20 2000  ceFAZolin (ANCEF) IVPB 1 g/50 mL premix        1 g 100 mL/hr over 30 Minutes Intravenous Every 24 hours 11/26/20 1248 12/03/20 2136   11/26/20 1345  ceFAZolin (ANCEF) IVPB 1 g/50 mL premix        1 g 100 mL/hr over 30 Minutes Intravenous  Once 11/26/20 1248 11/26/20 1433   11/17/20 1415  fluconazole (DIFLUCAN) 40 MG/ML suspension 200 mg        200 mg Oral  Once 11/17/20 1315 11/17/20 1357    11/03/20 2200  meropenem (MERREM) 500 mg in sodium chloride 0.9 % 100 mL IVPB        500 mg 200 mL/hr over 30 Minutes Intravenous Every 24 hours 11/03/20 1500 11/06/20 2217   11/01/20 1000  anidulafungin (ERAXIS) 100 mg in sodium chloride 0.9 % 100 mL IVPB  Status:  Discontinued       "Followed by" Linked Group Details   100 mg 78 mL/hr over 100 Minutes Intravenous Every 24 hours 10/31/20 0916 11/01/20 1404   10/31/20 1015  meropenem (MERREM) 1 g in sodium chloride 0.9 % 100 mL IVPB  Status:  Discontinued        1 g 200 mL/hr over 30 Minutes Intravenous Every 8 hours 10/31/20 0916 11/03/20 1500   10/31/20 1015  linezolid (ZYVOX) IVPB 600 mg  Status:  Discontinued        600 mg 300  mL/hr over 60 Minutes Intravenous Every 12 hours 10/31/20 0916 11/02/20 0906   10/31/20 1015  anidulafungin (ERAXIS) 200 mg in sodium chloride 0.9 % 200 mL IVPB       "Followed by" Linked Group Details   200 mg 78 mL/hr over 200 Minutes Intravenous  Once 10/31/20 0916 10/31/20 1630   10/25/20 1800  ceFAZolin (ANCEF) IVPB 2g/100 mL premix  Status:  Discontinued        2 g 200 mL/hr over 30 Minutes Intravenous Every 12 hours 10/25/20 1022 10/30/20 1103   10/23/20 2000  vancomycin (VANCOREADY) IVPB 750 mg/150 mL  Status:  Discontinued        750 mg 150 mL/hr over 60 Minutes Intravenous Every 24 hours 10/22/20 2036 10/25/20 1022   10/23/20 1800  piperacillin-tazobactam (ZOSYN) IVPB 3.375 g  Status:  Discontinued        3.375 g 100 mL/hr over 30 Minutes Intravenous Every 6 hours 10/23/20 1155 10/24/20 1422   10/23/20 0200  piperacillin-tazobactam (ZOSYN) IVPB 3.375 g  Status:  Discontinued        3.375 g 100 mL/hr over 30 Minutes Intravenous Every 8 hours 10/22/20 2036 10/23/20 1155   10/22/20 1630  vancomycin (VANCOREADY) IVPB 1500 mg/300 mL        1,500 mg 150 mL/hr over 120 Minutes Intravenous  Once 10/22/20 1537 10/22/20 2229   10/22/20 1630  piperacillin-tazobactam (ZOSYN) IVPB 2.25 g  Status:  Discontinued         2.25 g 100 mL/hr over 30 Minutes Intravenous Every 8 hours 10/22/20 1537 10/22/20 2036   10/22/20 1537  vancomycin variable dose per unstable renal function (pharmacist dosing)  Status:  Discontinued         Does not apply See admin instructions 10/22/20 1537 10/22/20 2036   10/19/20 0600  ceFAZolin (ANCEF) IVPB 2g/100 mL premix        2 g 200 mL/hr over 30 Minutes Intravenous To Radiology 10/18/20 1457 10/19/20 0938   10/10/20 0830  vancomycin (VANCOCIN) IVPB 1000 mg/200 mL premix       "Followed by" Linked Group Details   1,000 mg 200 mL/hr over 60 Minutes Intravenous Every 24 hours 10/09/20 0744 10/15/20 0902   10/09/20 0830  piperacillin-tazobactam (ZOSYN) IVPB 3.375 g        3.375 g 100 mL/hr over 30 Minutes Intravenous Every 6 hours 10/09/20 0744 10/16/20 0038   10/09/20 0830  vancomycin (VANCOREADY) IVPB 2000 mg/400 mL       "Followed by" Linked Group Details   2,000 mg 200 mL/hr over 120 Minutes Intravenous  Once 10/09/20 0744 10/09/20 1022   09/24/20 1000  ceFAZolin (ANCEF) IVPB 2g/100 mL premix  Status:  Discontinued        2 g 200 mL/hr over 30 Minutes Intravenous Every 12 hours 09/24/20 0801 09/24/20 1046   09/23/20 1400  vancomycin (VANCOCIN) IVPB 1000 mg/200 mL premix        1,000 mg 200 mL/hr over 60 Minutes Intravenous Every 24 hours 09/22/20 1436 09/28/20 1718   09/22/20 2200  ceFEPIme (MAXIPIME) 2 g in sodium chloride 0.9 % 100 mL IVPB  Status:  Discontinued        2 g 200 mL/hr over 30 Minutes Intravenous Every 12 hours 09/22/20 1436 09/24/20 0801   09/22/20 1030  ceFEPIme (MAXIPIME) 1 g in sodium chloride 0.9 % 100 mL IVPB        1 g 200 mL/hr over 30 Minutes Intravenous  Once 09/22/20 0934 09/22/20  1145   09/22/20 1015  vancomycin (VANCOCIN) IVPB 1000 mg/200 mL premix        1,000 mg 200 mL/hr over 60 Minutes Intravenous  Once 09/22/20 0934 09/22/20 1446   09/12/20 2200  ceFEPIme (MAXIPIME) 2 g in sodium chloride 0.9 % 100 mL IVPB        2 g 200 mL/hr  over 30 Minutes Intravenous Every 12 hours 09/12/20 0732 09/14/20 2134   09/11/20 1400  ceFEPIme (MAXIPIME) 2 g in sodium chloride 0.9 % 100 mL IVPB  Status:  Discontinued        2 g 200 mL/hr over 30 Minutes Intravenous Every 8 hours 09/11/20 1126 09/12/20 0732   09/08/20 1000  vancomycin (VANCOREADY) IVPB 2000 mg/400 mL        2,000 mg 200 mL/hr over 120 Minutes Intravenous  Once 09/08/20 0857 09/08/20 1224   09/08/20 1000  ceFEPIme (MAXIPIME) 2 g in sodium chloride 0.9 % 100 mL IVPB  Status:  Discontinued        2 g 200 mL/hr over 30 Minutes Intravenous Every 12 hours 09/08/20 0857 09/11/20 1126   09/08/20 0856  vancomycin variable dose per unstable renal function (pharmacist dosing)  Status:  Discontinued         Does not apply See admin instructions 09/08/20 0857 09/09/20 0935   09/02/20 1600  cefTRIAXone (ROCEPHIN) 1 g in sodium chloride 0.9 % 100 mL IVPB  Status:  Discontinued        1 g 200 mL/hr over 30 Minutes Intravenous Every 24 hours 09/01/20 1811 09/02/20 0838   09/01/20 1800  fluconazole (DIFLUCAN) IVPB 400 mg        400 mg 50 mL/hr over 240 Minutes Intravenous  Once 09/01/20 1749 09/02/20 0603   09/01/20 1530  piperacillin-tazobactam (ZOSYN) IVPB 3.375 g        3.375 g 12.5 mL/hr over 240 Minutes Intravenous Every 8 hours 09/01/20 1514 09/05/20 2111   09/01/20 1000  levofloxacin (LEVAQUIN) tablet 250 mg  Status:  Discontinued        250 mg Oral Daily 08/31/20 1508 08/31/20 1735   08/31/20 1730  cefTRIAXone (ROCEPHIN) 1 g in sodium chloride 0.9 % 100 mL IVPB  Status:  Discontinued        1 g 200 mL/hr over 30 Minutes Intravenous Every 24 hours 08/31/20 1726 09/01/20 1513   08/31/20 1730  azithromycin (ZITHROMAX) 500 mg in sodium chloride 0.9 % 250 mL IVPB  Status:  Discontinued        500 mg 250 mL/hr over 60 Minutes Intravenous Every 24 hours 08/31/20 1726 09/02/20 0838       Time spent: 45 minutes    [name] ANP  [facility] 7 am - 330 pm/M-F for  direct patient care and secure chat Please refer to Amion for contact info 106  days

## 2020-12-16 NOTE — Plan of Care (Signed)
  Problem: Education: Goal: Utilization of techniques to improve thought processes will improve Outcome: Progressing Goal: Knowledge of the prescribed therapeutic regimen will improve Outcome: Progressing   Problem: Activity: Goal: Interest or engagement in leisure activities will improve Outcome: Progressing Goal: Imbalance in normal sleep/wake cycle will improve Outcome: Progressing   Problem: Coping: Goal: Coping ability will improve Outcome: Progressing Goal: Will verbalize feelings Outcome: Progressing   Problem: Health Behavior/Discharge Planning: Goal: Ability to make decisions will improve Outcome: Progressing Goal: Compliance with therapeutic regimen will improve Outcome: Progressing   Problem: Role Relationship: Goal: Will demonstrate positive changes in social behaviors and relationships Outcome: Progressing   Problem: Safety: Goal: Ability to identify and utilize support systems that promote safety will improve Outcome: Progressing   Problem: Self-Concept: Goal: Level of anxiety will decrease Outcome: Progressing   Problem: Education: Goal: Knowledge of General Education information will improve Description: Including pain rating scale, medication(s)/side effects and non-pharmacologic comfort measures Outcome: Progressing   Problem: Health Behavior/Discharge Planning: Goal: Ability to manage health-related needs will improve Outcome: Progressing   Problem: Clinical Measurements: Goal: Ability to maintain clinical measurements within normal limits will improve Outcome: Progressing Goal: Will remain free from infection Outcome: Progressing Goal: Diagnostic test results will improve Outcome: Progressing Goal: Respiratory complications will improve Outcome: Progressing Goal: Cardiovascular complication will be avoided Outcome: Progressing   Problem: Activity: Goal: Risk for activity intolerance will decrease Outcome: Progressing   Problem:  Nutrition: Goal: Adequate nutrition will be maintained Outcome: Progressing   Problem: Coping: Goal: Level of anxiety will decrease Outcome: Progressing   Problem: Elimination: Goal: Will not experience complications related to bowel motility Outcome: Progressing Goal: Will not experience complications related to urinary retention Outcome: Progressing   Problem: Pain Managment: Goal: General experience of comfort will improve Outcome: Progressing   Problem: Safety: Goal: Ability to remain free from injury will improve Outcome: Progressing   Problem: Skin Integrity: Goal: Risk for impaired skin integrity will decrease Outcome: Progressing   Problem: Activity: Goal: Ability to tolerate increased activity will improve Outcome: Progressing   Problem: Respiratory: Goal: Ability to maintain a clear airway and adequate ventilation will improve Outcome: Progressing   Problem: Role Relationship: Goal: Method of communication will improve Outcome: Progressing   Problem: Education: Goal: Knowledge of disease and its progression will improve Outcome: Progressing   Problem: Fluid Volume: Goal: Compliance with measures to maintain balanced fluid volume will improve Outcome: Progressing   Problem: Health Behavior/Discharge Planning: Goal: Ability to manage health-related needs will improve Outcome: Progressing   Problem: Nutritional: Goal: Ability to make healthy dietary choices will improve Outcome: Progressing   Problem: Clinical Measurements: Goal: Complications related to the disease process, condition or treatment will be avoided or minimized Outcome: Progressing   Problem: Education: Goal: Knowledge of risk factors and measures for prevention of condition will improve Outcome: Progressing   Problem: Coping: Goal: Psychosocial and spiritual needs will be supported Outcome: Progressing   Problem: Respiratory: Goal: Will maintain a patent airway Outcome:  Progressing Goal: Complications related to the disease process, condition or treatment will be avoided or minimized Outcome: Progressing

## 2020-12-16 NOTE — Progress Notes (Signed)
PT refused oatmeal, pancake, and chewed 4 pieces of sausage and spit it out without swallowing. Pt ate half of bananna.

## 2020-12-16 NOTE — Progress Notes (Signed)
[facility] ROUNDING NOTE   Subjective:   Interval History: This is a 70 year old lady with a history of recent Covid pneumonia obesity: Status post colon resection and colostomy bag diabetes mellitus and acute kidney injury requiring dialysis.  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  Blood pressure 133/86 pulse 120 temperature 97.9   O2 sats 100% room air.  Urine output 1.2 L 12/15/2020  Sodium 133 potassium 4.5 chloride 104 CO2 19 BUN 71 creatinine 0.83 glucose 263 calcium 11.3 albumin 2.4 phosphorus 3.1 hemoglobin 9.2 WBC 13.4  Last dialysis treatment 12/11/2020 with removal of 1.4 L    Objective:  Vital signs in last 24 hours:  Temp:  [97.9 F (36.6 C)-98.3 F (36.8 C)] 97.9 F (36.6 C) (02/17 0731) Pulse Rate:  [102-120] 120 (02/17 0731) Resp:  [15-20] 20 (02/17 0731) BP: (115-134)/(73-88) 133/86 (02/17 0731) SpO2:  [95 %-100 %] 100 % (02/17 0731) FiO2 (%):  [21 %] 21 % (02/16 1628)  Weight change:  Filed Weights    Intake/Output: I/O last 3 completed shifts: In: 63 [Other:26; NG/GT:467] Out: 6073 [Urine:2350; Emesis/NG output:457; Drains:380; Stool:450]   Intake/Output this shift:  No intake/output data recorded.  General:NAD, resting comfortably, has tracheostomy Heart:RRR, s1s2 nl Lungs:clear b/l, no crackle Abdomen:soft, Non-tender, non-distended Extremities: Trace dependent edema. Dialysis Access: Right IJ TDC in place. Neurology: Alert awake and following commands.   Basic Metabolic Panel: Recent Labs  Lab 12/11/20 0643 12/12/20 1100 12/13/20 0119 12/14/20 0746 12/15/20 0101 12/16/20 0147  NA 140 131* 132* 133* 134* 133*  K 2.6* 4.2 4.5 4.3 4.8 4.5  CL 113* 99 99 106 104 104  CO2 18* 20* 21* 17* 20* 19*  GLUCOSE 103* 220* 234* 281* 197* 263*  BUN 66* 76* 90* 79* 76* 71*  CREATININE 0.64 1.05* 1.00 0.86 0.77 0.83  CALCIUM 8.3* 10.2 10.0 10.7* 11.5* 11.3*  MG  1.2*  --   --   --   --   --   PHOS  --  4.1 4.1 3.1 2.9 3.1    Liver Function Tests: Recent Labs  Lab 12/11/20 0643 12/12/20 1100 12/13/20 0119 12/14/20 0746 12/15/20 0101 12/16/20 0147  AST 15  --   --   --   --   --   ALT 13  --   --   --   --   --   ALKPHOS 63  --   --   --   --   --   BILITOT 0.4  --   --   --   --   --   PROT 5.8*  --   --   --   --   --   ALBUMIN 1.8* 2.5* 2.4* 2.6* 2.6* 2.4*   No results for input(s): LIPASE, AMYLASE in the last 168 hours. No results for input(s): AMMONIA in the last 168 hours.  CBC: Recent Labs  Lab 12/09/20 0903 12/11/20 0643 12/16/20 0147  WBC 16.5* 15.7* 13.4*  HGB 8.1* 7.9* 9.2*  HCT 26.8* 25.1* 30.9*  MCV 87.9 85.7 87.5  PLT 443* 283 305    Cardiac Enzymes: No results for input(s): CKTOTAL, CKMB, CKMBINDEX, TROPONINI in the last 168 hours.  BNP: Invalid input(s): POCBNP  CBG: Recent Labs  Lab 12/15/20 1641 12/15/20 2028 12/16/20 0013 12/16/20 0503 12/16/20 0832  GLUCAP 210* 255* 238* 281* 214*    Microbiology: Results for orders placed or performed during the hospital encounter  of 08/31/20  MRSA PCR Screening     Status: None   Collection Time: 09/08/20  8:17 AM   Specimen: Nasal Mucosa; Nasopharyngeal  Result Value Ref Range Status   MRSA by PCR NEGATIVE NEGATIVE Final    Comment:        The GeneXpert MRSA Assay (FDA approved for NASAL specimens only), is one component of a comprehensive MRSA colonization surveillance program. It is not intended to diagnose MRSA infection nor to guide or monitor treatment for MRSA infections. Performed at [facility], [place] [address], [place], [place] [address]   Culture, blood (Routine X 2) w Reflex to ID Panel     Status: None   Collection Time: 09/08/20  8:35 AM   Specimen: BLOOD LEFT HAND  Result Value Ref Range Status   Specimen Description BLOOD LEFT HAND  Final   Special Requests   Final    BOTTLES DRAWN AEROBIC ONLY Blood Culture results may  not be optimal due to an inadequate volume of blood received in culture bottles   Culture   Final    NO GROWTH 5 DAYS Performed at [facility], [place] [address] [place], [place] [address]    Report Status 09/13/2020 FINAL  Final  Culture, blood (Routine X 2) w Reflex to ID Panel     Status: None   Collection Time: 09/08/20  8:35 AM   Specimen: BLOOD RIGHT HAND  Result Value Ref Range Status   Specimen Description BLOOD RIGHT HAND  Final   Special Requests   Final    BOTTLES DRAWN AEROBIC ONLY Blood Culture results may not be optimal due to an inadequate volume of blood received in culture bottles   Culture   Final    NO GROWTH 5 DAYS Performed at  [facility], [place] [address] [place], [place] [address]    Report Status 09/13/2020 FINAL  Final  Culture, respiratory (non-expectorated)     Status: None   Collection Time: 09/16/20  8:35 AM   Specimen: Tracheal Aspirate; Respiratory  Result Value Ref Range Status   Specimen Description TRACHEAL ASPIRATE  Final   Special Requests NONE  Final   Gram Stain   Final    ABUNDANT WBC PRESENT, PREDOMINANTLY PMN RARE YEAST    Culture   Final    FEW Normal respiratory flora-no Staph aureus or Pseudomonas seen Performed at [facility], [address] [place] [address] [place], [place] [address]    Report Status 09/18/2020 FINAL  Final  Culture, respiratory (non-expectorated)     Status: None   Collection Time: 09/16/20 11:03 AM   Specimen: Bronchoalveolar Lavage; Respiratory  Result Value Ref Range Status   Specimen Description BRONCHIAL ALVEOLAR LAVAGE  Final   Special Requests NONE  Final   Gram Stain   Final    ABUNDANT WBC PRESENT, PREDOMINANTLY PMN RARE GRAM POSITIVE COCCI Performed at [facility], [place] [address] [place], [place] [address]    Culture FEW CANDIDA ALBICANS  Final   Report Status 09/18/2020 FINAL  Final  Culture, respiratory (non-expectorated)     Status: None   Collection Time: 09/22/20  9:30 AM    Specimen: Tracheal Aspirate; Respiratory  Result Value Ref Range Status   Specimen Description TRACHEAL ASPIRATE  Final   Special Requests NONE  Final   Gram Stain   Final    RARE WBC PRESENT,BOTH PMN AND MONONUCLEAR RARE GRAM POSITIVE COCCI IN PAIRS RARE GRAM VARIABLE ROD Performed at  [facility], [place] [address], [place], [place] [address]    Culture   Final    MODERATE METHICILLIN RESISTANT STAPHYLOCOCCUS AUREUS   Report Status 09/24/2020 FINAL  Final   Organism ID, Bacteria METHICILLIN RESISTANT STAPHYLOCOCCUS AUREUS  Final      Susceptibility   Methicillin resistant staphylococcus aureus - MIC*    CIPROFLOXACIN <=0.5 SENSITIVE Sensitive     ERYTHROMYCIN <=0.25 SENSITIVE Sensitive     GENTAMICIN <=0.5 SENSITIVE Sensitive     OXACILLIN RESISTANT Resistant     TETRACYCLINE <=1 SENSITIVE Sensitive     VANCOMYCIN 1 SENSITIVE Sensitive     TRIMETH/SULFA <=10 SENSITIVE Sensitive     CLINDAMYCIN 4 RESISTANT Resistant     RIFAMPIN <=0.5 SENSITIVE Sensitive     Inducible Clindamycin NEGATIVE Sensitive     * MODERATE METHICILLIN RESISTANT STAPHYLOCOCCUS AUREUS  Culture, blood (Routine X 2) w Reflex to ID Panel     Status: Abnormal   Collection Time: 09/22/20 10:45 AM   Specimen: BLOOD LEFT HAND  Result Value Ref Range Status   Specimen Description BLOOD LEFT HAND  Final   Special Requests   Final    BOTTLES DRAWN AEROBIC ONLY Blood Culture results may not be optimal due to an inadequate volume of blood received in culture bottles   Culture  Setup Time   Final    GRAM POSITIVE COCCI IN CLUSTERS AEROBIC BOTTLE ONLY CRITICAL RESULT CALLED TO, READ BACK BY AND VERIFIED WITH: PHARMD J MILLEN AT 0911 09/25/20 BY L BENFIELD    Culture (A)  Final    STAPHYLOCOCCUS EPIDERMIDIS THE SIGNIFICANCE OF ISOLATING THIS ORGANISM FROM A SINGLE SET OF BLOOD CULTURES WHEN MULTIPLE SETS ARE DRAWN IS UNCERTAIN. PLEASE NOTIFY THE MICROBIOLOGY DEPARTMENT WITHIN ONE WEEK IF SPECIATION AND  SENSITIVITIES ARE REQUIRED. Performed at [facility], [place] [address] [place],  [address]    Report Status 09/26/2020 FINAL  Final  Blood Culture ID Panel (Reflexed)     Status: Abnormal   Collection Time: 09/22/20 10:45 AM  Result Value Ref Range Status   Enterococcus faecalis NOT DETECTED NOT DETECTED Final   Enterococcus Faecium NOT DETECTED NOT DETECTED Final   Listeria monocytogenes NOT DETECTED NOT DETECTED Final   Staphylococcus species DETECTED (A) NOT DETECTED Final    Comment: CRITICAL RESULT CALLED TO, READ BACK BY AND VERIFIED WITH: PHARMD J MILLEN AT 0911 09/25/20 BY L BENFIELD    Staphylococcus aureus (BCID) NOT DETECTED NOT DETECTED Final   Staphylococcus epidermidis DETECTED (A) NOT DETECTED Final    Comment: Methicillin (oxacillin) resistant coagulase negative staphylococcus. Possible blood culture contaminant (unless isolated from more than one blood culture draw or clinical case suggests pathogenicity). No antibiotic treatment is indicated for blood  culture contaminants. CRITICAL RESULT CALLED TO, READ BACK BY AND VERIFIED WITH: PHARMD J MILLEN AT 0911 09/25/20 BY L BENFIELD    Staphylococcus lugdunensis NOT DETECTED NOT DETECTED Final   Streptococcus species NOT DETECTED NOT DETECTED Final   Streptococcus agalactiae NOT DETECTED NOT DETECTED Final   Streptococcus pneumoniae NOT DETECTED NOT DETECTED Final   Streptococcus pyogenes NOT DETECTED NOT DETECTED Final   A.calcoaceticus-baumannii NOT DETECTED NOT DETECTED Final   Bacteroides fragilis NOT DETECTED NOT DETECTED Final   Enterobacterales NOT DETECTED NOT DETECTED Final   Enterobacter cloacae complex NOT DETECTED NOT DETECTED Final   Escherichia coli NOT DETECTED NOT DETECTED Final   Klebsiella aerogenes NOT DETECTED NOT DETECTED Final   Klebsiella oxytoca NOT DETECTED NOT DETECTED  Final   Klebsiella pneumoniae NOT DETECTED NOT DETECTED Final   Proteus species NOT DETECTED NOT DETECTED Final    Salmonella species NOT DETECTED NOT DETECTED Final   Serratia marcescens NOT DETECTED NOT DETECTED Final   Haemophilus influenzae NOT DETECTED NOT DETECTED Final   Neisseria meningitidis NOT DETECTED NOT DETECTED Final   Pseudomonas aeruginosa NOT DETECTED NOT DETECTED Final   Stenotrophomonas maltophilia NOT DETECTED NOT DETECTED Final   Candida albicans NOT DETECTED NOT DETECTED Final   Candida auris NOT DETECTED NOT DETECTED Final   Candida glabrata NOT DETECTED NOT DETECTED Final   Candida krusei NOT DETECTED NOT DETECTED Final   Candida parapsilosis NOT DETECTED NOT DETECTED Final   Candida tropicalis NOT DETECTED NOT DETECTED Final   Cryptococcus neoformans/gattii NOT DETECTED NOT DETECTED Final   Methicillin resistance mecA/C DETECTED (A) NOT DETECTED Final    Comment: CRITICAL RESULT CALLED TO, READ BACK BY AND VERIFIED WITH: PHARMD J MILLEN AT 0911 09/25/20 BY L BENFIELD Performed at [facility], [address], [place], [place] [address]   Culture, blood (Routine X 2) w Reflex to ID Panel     Status: None   Collection Time: 09/22/20 10:52 AM   Specimen: BLOOD LEFT HAND  Result Value Ref Range Status   Specimen Description BLOOD LEFT HAND  Final   Special Requests   Final    BOTTLES DRAWN AEROBIC ONLY Blood Culture results may not be optimal due to an inadequate volume of blood received in culture bottles   Culture   Final    NO GROWTH 5 DAYS Performed at [facility], [place] [address] [place], [place] [address]    Report Status 09/27/2020 FINAL  Final  Culture, blood (Routine X 2) w Reflex to ID Panel     Status: Abnormal   Collection Time: 09/29/20  5:51 PM   Specimen: BLOOD LEFT HAND  Result Value Ref Range Status   Specimen Description BLOOD LEFT HAND  Final   Special Requests   Final    BOTTLES DRAWN AEROBIC ONLY Blood Culture results may not be optimal due to an inadequate volume of blood received in culture bottles   Culture  Setup Time   Final     GRAM POSITIVE COCCI IN CLUSTERS AEROBIC BOTTLE ONLY Organism ID to follow CRITICAL RESULT CALLED TO, READ BACK BY AND VERIFIED WITH: A. [facility] PharmD 11:45 10/01/20 ([email or web address])    Culture (A)  Final    STAPHYLOCOCCUS EPIDERMIDIS THE SIGNIFICANCE OF ISOLATING THIS ORGANISM FROM A SINGLE SET OF BLOOD CULTURES WHEN MULTIPLE SETS ARE DRAWN IS UNCERTAIN. PLEASE NOTIFY THE MICROBIOLOGY DEPARTMENT WITHIN ONE WEEK IF SPECIATION AND SENSITIVITIES ARE REQUIRED. Performed at [facility], [place] [address], [place],  [address]    Report Status 10/03/2020 FINAL  Final  Blood Culture ID Panel (Reflexed)     Status: Abnormal   Collection Time: 09/29/20  5:51 PM  Result Value Ref Range Status   Enterococcus faecalis NOT DETECTED NOT DETECTED Final   Enterococcus Faecium NOT DETECTED NOT DETECTED Final   Listeria monocytogenes NOT DETECTED NOT DETECTED Final   Staphylococcus species DETECTED (A) NOT DETECTED Final    Comment: CRITICAL RESULT CALLED TO, READ BACK BY AND VERIFIED WITH: A. [facility] PharmD 11:45 10/01/20 (wilsonm)    Staphylococcus aureus (BCID) NOT DETECTED NOT DETECTED Final   Staphylococcus epidermidis DETECTED (A) NOT DETECTED Final    Comment: Methicillin (oxacillin) resistant coagulase negative staphylococcus. Possible blood culture contaminant (unless  isolated from more than one blood culture draw or clinical case suggests pathogenicity). No antibiotic treatment is indicated for blood  culture contaminants. CRITICAL RESULT CALLED TO, READ BACK BY AND VERIFIED WITH: A. [facility] PharmD 11:45 10/01/20 (wilsonm)    Staphylococcus lugdunensis NOT DETECTED NOT DETECTED Final   Streptococcus species NOT DETECTED NOT DETECTED Final   Streptococcus agalactiae NOT DETECTED NOT DETECTED Final   Streptococcus pneumoniae NOT DETECTED NOT DETECTED Final   Streptococcus pyogenes NOT DETECTED NOT DETECTED Final   A.calcoaceticus-baumannii NOT DETECTED NOT DETECTED Final   Bacteroides fragilis NOT  DETECTED NOT DETECTED Final   Enterobacterales NOT DETECTED NOT DETECTED Final   Enterobacter cloacae complex NOT DETECTED NOT DETECTED Final   Escherichia coli NOT DETECTED NOT DETECTED Final   Klebsiella aerogenes NOT DETECTED NOT DETECTED Final   Klebsiella oxytoca NOT DETECTED NOT DETECTED Final   Klebsiella pneumoniae NOT DETECTED NOT DETECTED Final   Proteus species NOT DETECTED NOT DETECTED Final   Salmonella species NOT DETECTED NOT DETECTED Final   Serratia marcescens NOT DETECTED NOT DETECTED Final   Haemophilus influenzae NOT DETECTED NOT DETECTED Final   Neisseria meningitidis NOT DETECTED NOT DETECTED Final   Pseudomonas aeruginosa NOT DETECTED NOT DETECTED Final   Stenotrophomonas maltophilia NOT DETECTED NOT DETECTED Final   Candida albicans NOT DETECTED NOT DETECTED Final   Candida auris NOT DETECTED NOT DETECTED Final   Candida glabrata NOT DETECTED NOT DETECTED Final   Candida krusei NOT DETECTED NOT DETECTED Final   Candida parapsilosis NOT DETECTED NOT DETECTED Final   Candida tropicalis NOT DETECTED NOT DETECTED Final   Cryptococcus neoformans/gattii NOT DETECTED NOT DETECTED Final   Methicillin resistance mecA/C DETECTED (A) NOT DETECTED Final    Comment: CRITICAL RESULT CALLED TO, READ BACK BY AND VERIFIED WITH: A. [facility] PharmD 11:45 10/01/20 (wilsonm) Performed at [facility], [place] [address], [place], [place] [address]   Culture, blood (Routine X 2) w Reflex to ID Panel     Status: None   Collection Time: 09/29/20  6:22 PM   Specimen: BLOOD  Result Value Ref Range Status   Specimen Description BLOOD LEFT FOOT  Final   Special Requests   Final    BOTTLES DRAWN AEROBIC ONLY Blood Culture results may not be optimal due to an inadequate volume of blood received in culture bottles   Culture   Final    NO GROWTH 5 DAYS Performed at [facility], [place] [address] [place], [place] [address]    Report Status 10/04/2020 FINAL  Final  Culture, blood  (routine x 2)     Status: None   Collection Time: 10/10/20 11:11 AM   Specimen: BLOOD  Result Value Ref Range Status   Specimen Description BLOOD BLOOD LEFT HAND  Final   Special Requests   Final    BOTTLES DRAWN AEROBIC AND ANAEROBIC Blood Culture adequate volume   Culture   Final    NO GROWTH 5 DAYS Performed at [facility], [place] [address] [place], [place] [address]    Report Status 10/15/2020 FINAL  Final  Culture, blood (routine x 2)     Status: None   Collection Time: 10/10/20 11:11 AM   Specimen: BLOOD  Result Value Ref Range Status   Specimen Description BLOOD LEFT ANTECUBITAL  Final   Special Requests   Final    BOTTLES DRAWN AEROBIC ONLY Blood Culture results may not be optimal due to an inadequate volume of blood received in  culture bottles   Culture   Final    NO GROWTH 5 DAYS Performed at [facility], [place] [address], [place], [place] [address]    Report Status 10/15/2020 FINAL  Final  Culture, respiratory (non-expectorated)     Status: None   Collection Time: 10/22/20  6:14 PM   Specimen: Tracheal Aspirate; Respiratory  Result Value Ref Range Status   Specimen Description TRACHEAL ASPIRATE  Final   Special Requests NONE  Final   Gram Stain   Final    ABUNDANT WBC PRESENT, PREDOMINANTLY PMN ABUNDANT GRAM POSITIVE COCCI Performed at [facility], [place] [address] [place], [place] [address]    Culture FEW STAPHYLOCOCCUS AUREUS  Final   Report Status 10/25/2020 FINAL  Final   Organism ID, Bacteria STAPHYLOCOCCUS AUREUS  Final      Susceptibility   Staphylococcus aureus - MIC*    CIPROFLOXACIN <=0.5 SENSITIVE Sensitive     ERYTHROMYCIN <=0.25 SENSITIVE Sensitive     GENTAMICIN <=0.5 SENSITIVE Sensitive     OXACILLIN <=0.25 SENSITIVE Sensitive     TETRACYCLINE <=1 SENSITIVE Sensitive     VANCOMYCIN 1 SENSITIVE Sensitive     TRIMETH/SULFA <=10 SENSITIVE Sensitive     CLINDAMYCIN <=0.25 SENSITIVE Sensitive     RIFAMPIN <=0.5 SENSITIVE Sensitive      Inducible Clindamycin NEGATIVE Sensitive     * FEW STAPHYLOCOCCUS AUREUS  Culture, blood (Routine X 2) w Reflex to ID Panel     Status: None   Collection Time: 10/23/20  2:53 AM   Specimen: BLOOD LEFT HAND  Result Value Ref Range Status   Specimen Description BLOOD LEFT HAND  Final   Special Requests   Final    BOTTLES DRAWN AEROBIC AND ANAEROBIC Blood Culture results may not be optimal due to an inadequate volume of blood received in culture bottles   Culture   Final    NO GROWTH 5 DAYS Performed at [facility], [place] [address], [place], [place] [address]    Report Status 10/28/2020 FINAL  Final  Culture, blood (Routine X 2) w Reflex to ID Panel     Status: None   Collection Time: 10/23/20  3:01 AM   Specimen: BLOOD LEFT FOREARM  Result Value Ref Range Status   Specimen Description BLOOD LEFT FOREARM  Final   Special Requests   Final    AEROBIC BOTTLE ONLY Blood Culture results may not be optimal due to an inadequate volume of blood received in culture bottles   Culture   Final    NO GROWTH 5 DAYS Performed at [facility], [place] [address] [place] [address], [place], [place] [address]    Report Status 10/28/2020 FINAL  Final  Culture, blood (Routine X 2) w Reflex to ID Panel     Status: None   Collection Time: 10/31/20 10:13 AM   Specimen: BLOOD  Result Value Ref Range Status   Specimen Description BLOOD PICC LINE  Final   Special Requests   Final    BOTTLES DRAWN AEROBIC AND ANAEROBIC Blood Culture adequate volume   Culture   Final    NO GROWTH 6 DAYS Performed at [facility], [place] [address] [place],  [address]    Report Status 11/06/2020 FINAL  Final  Culture, blood (Routine X 2) w Reflex to ID Panel     Status: None   Collection Time: 10/31/20 10:18 AM   Specimen: BLOOD LEFT HAND  Result Value Ref Range Status  Specimen Description BLOOD LEFT HAND  Final   Special Requests   Final    BOTTLES DRAWN AEROBIC ONLY Blood Culture results may not be optimal due  to an inadequate volume of blood received in culture bottles   Culture   Final    NO GROWTH 6 DAYS Performed at [facility], [place] [address], [place], [place] [address]    Report Status 11/06/2020 FINAL  Final  Culture, respiratory (non-expectorated)     Status: None   Collection Time: 11/02/20 10:11 AM   Specimen: Tracheal Aspirate; Respiratory  Result Value Ref Range Status   Specimen Description TRACHEAL ASPIRATE  Final   Special Requests NONE  Final   Gram Stain   Final    MODERATE WBC PRESENT,BOTH PMN AND MONONUCLEAR RARE GRAM POSITIVE COCCI IN PAIRS Performed at [facility], [place] [address], [place], [place] [address]    Culture RARE STAPHYLOCOCCUS AUREUS  Final   Report Status 11/05/2020 FINAL  Final   Organism ID, Bacteria STAPHYLOCOCCUS AUREUS  Final      Susceptibility   Staphylococcus aureus - MIC*    CIPROFLOXACIN <=0.5 SENSITIVE Sensitive     ERYTHROMYCIN <=0.25 SENSITIVE Sensitive     GENTAMICIN <=0.5 SENSITIVE Sensitive     OXACILLIN <=0.25 SENSITIVE Sensitive     TETRACYCLINE <=1 SENSITIVE Sensitive     VANCOMYCIN 1 SENSITIVE Sensitive     TRIMETH/SULFA <=10 SENSITIVE Sensitive     CLINDAMYCIN <=0.25 SENSITIVE Sensitive     RIFAMPIN <=0.5 SENSITIVE Sensitive     Inducible Clindamycin NEGATIVE Sensitive     * RARE STAPHYLOCOCCUS AUREUS  Culture, respiratory (non-expectorated)     Status: None   Collection Time: 11/22/20  4:45 PM   Specimen: Tracheal Aspirate; Respiratory  Result Value Ref Range Status   Specimen Description TRACHEAL ASPIRATE  Final   Special Requests NONE  Final   Gram Stain   Final    ABUNDANT WBC PRESENT,BOTH PMN AND MONONUCLEAR ABUNDANT GRAM POSITIVE COCCI IN PAIRS Performed at [facility], [place] [address] [place], [place] [address]    Culture MODERATE STAPHYLOCOCCUS AUREUS  Final   Report Status 11/25/2020 FINAL  Final   Organism ID, Bacteria STAPHYLOCOCCUS AUREUS  Final      Susceptibility   Staphylococcus aureus  - MIC*    CIPROFLOXACIN <=0.5 SENSITIVE Sensitive     ERYTHROMYCIN <=0.25 SENSITIVE Sensitive     GENTAMICIN <=0.5 SENSITIVE Sensitive     OXACILLIN <=0.25 SENSITIVE Sensitive     TETRACYCLINE <=1 SENSITIVE Sensitive     VANCOMYCIN <=0.5 SENSITIVE Sensitive     TRIMETH/SULFA <=10 SENSITIVE Sensitive     CLINDAMYCIN <=0.25 SENSITIVE Sensitive     RIFAMPIN <=0.5 SENSITIVE Sensitive     Inducible Clindamycin NEGATIVE Sensitive     * MODERATE STAPHYLOCOCCUS AUREUS  Culture, blood (routine x 2)     Status: Abnormal   Collection Time: 12/05/20 11:07 AM   Specimen: BLOOD RIGHT ARM  Result Value Ref Range Status   Specimen Description BLOOD RIGHT ARM  Final   Special Requests   Final    BOTTLES DRAWN AEROBIC ONLY Blood Culture adequate volume   Culture  Setup Time   Final    GRAM POSITIVE COCCI IN CLUSTERS AEROBIC BOTTLE ONLY CRITICAL RESULT CALLED TO, READ BACK BY AND VERIFIED WITH: Tillman Sers PHARMD 2000 12/06/20 A BROWNING    Culture (A)  Final    STAPHYLOCOCCUS EPIDERMIDIS THE SIGNIFICANCE OF ISOLATING THIS ORGANISM FROM A  SINGLE SET OF BLOOD CULTURES WHEN MULTIPLE SETS ARE DRAWN IS UNCERTAIN. PLEASE NOTIFY THE MICROBIOLOGY DEPARTMENT WITHIN ONE WEEK IF SPECIATION AND SENSITIVITIES ARE REQUIRED. Performed at [facility], [place] [address], [place], [place] [address]    Report Status 12/08/2020 FINAL  Final  Culture, blood (routine x 2)     Status: None   Collection Time: 12/05/20 11:07 AM   Specimen: BLOOD LEFT HAND  Result Value Ref Range Status   Specimen Description BLOOD LEFT HAND  Final   Special Requests   Final    BOTTLES DRAWN AEROBIC ONLY Blood Culture adequate volume   Culture   Final    NO GROWTH 5 DAYS Performed at [facility], [place] [address], [place], [place] [address]    Report Status 12/10/2020 FINAL  Final  Blood Culture ID Panel (Reflexed)     Status: Abnormal   Collection Time: 12/05/20 11:07 AM  Result Value Ref Range Status   Enterococcus faecalis  NOT DETECTED NOT DETECTED Final   Enterococcus Faecium NOT DETECTED NOT DETECTED Final   Listeria monocytogenes NOT DETECTED NOT DETECTED Final   Staphylococcus species DETECTED (A) NOT DETECTED Final    Comment: CRITICAL RESULT CALLED TO, READ BACK BY AND VERIFIED WITH: Tillman Sers PHARMD 2000 12/06/20 A BROWNING    Staphylococcus aureus (BCID) NOT DETECTED NOT DETECTED Final   Staphylococcus epidermidis DETECTED (A) NOT DETECTED Final    Comment: Methicillin (oxacillin) resistant coagulase negative staphylococcus. Possible blood culture contaminant (unless isolated from more than one blood culture draw or clinical case suggests pathogenicity). No antibiotic treatment is indicated for blood  culture contaminants. CRITICAL RESULT CALLED TO, READ BACK BY AND VERIFIED WITH: Tillman Sers PHARMD 2000 12/06/20 A BROWNING    Staphylococcus lugdunensis NOT DETECTED NOT DETECTED Final   Streptococcus species NOT DETECTED NOT DETECTED Final   Streptococcus agalactiae NOT DETECTED NOT DETECTED Final   Streptococcus pneumoniae NOT DETECTED NOT DETECTED Final   Streptococcus pyogenes NOT DETECTED NOT DETECTED Final   A.calcoaceticus-baumannii NOT DETECTED NOT DETECTED Final   Bacteroides fragilis NOT DETECTED NOT DETECTED Final   Enterobacterales NOT DETECTED NOT DETECTED Final   Enterobacter cloacae complex NOT DETECTED NOT DETECTED Final   Escherichia coli NOT DETECTED NOT DETECTED Final   Klebsiella aerogenes NOT DETECTED NOT DETECTED Final   Klebsiella oxytoca NOT DETECTED NOT DETECTED Final   Klebsiella pneumoniae NOT DETECTED NOT DETECTED Final   Proteus species NOT DETECTED NOT DETECTED Final   Salmonella species NOT DETECTED NOT DETECTED Final   Serratia marcescens NOT DETECTED NOT DETECTED Final   Haemophilus influenzae NOT DETECTED NOT DETECTED Final   Neisseria meningitidis NOT DETECTED NOT DETECTED Final   Pseudomonas aeruginosa NOT DETECTED NOT DETECTED Final   Stenotrophomonas maltophilia NOT  DETECTED NOT DETECTED Final   Candida albicans NOT DETECTED NOT DETECTED Final   Candida auris NOT DETECTED NOT DETECTED Final   Candida glabrata NOT DETECTED NOT DETECTED Final   Candida krusei NOT DETECTED NOT DETECTED Final   Candida parapsilosis NOT DETECTED NOT DETECTED Final   Candida tropicalis NOT DETECTED NOT DETECTED Final   Cryptococcus neoformans/gattii NOT DETECTED NOT DETECTED Final   Methicillin resistance mecA/C DETECTED (A) NOT DETECTED Final    Comment: CRITICAL RESULT CALLED TO, READ BACK BY AND VERIFIED WITH: Tillman Sers PHARMD 2000 12/06/20 A BROWNING Performed at [facility], [address] [place], [place] [address]     Coagulation Studies: No results for input(s): LABPROT, INR in  the last 72 hours.  Urinalysis: No results for input(s): COLORURINE, LABSPEC, PHURINE, GLUCOSEU, HGBUR, BILIRUBINUR, KETONESUR, PROTEINUR, UROBILINOGEN, NITRITE, LEUKOCYTESUR in the last 72 hours.  Invalid input(s): APPERANCEUR    Imaging: No results found.   Medications:   . albumin human 25 g (11/16/20 1516)   . acetaminophen (TYLENOL) oral liquid 160 mg/5 mL  650 mg Oral Q6H  . apixaban  2.5 mg Oral BID  . carbamide peroxide  10 drop Both EARS BID  . chlorhexidine gluconate (MEDLINE KIT)  15 mL Mouth Rinse BID  . Chlorhexidine Gluconate Cloth  6 each Topical Q0600  . collagenase   Topical BID  . darbepoetin (ARANESP) injection - DIALYSIS  100 mcg Intravenous Q Sat-HD  . diclofenac Sodium  2 g Topical QID  . feeding supplement (NEPRO CARB STEADY)  237 mL Oral TID BM  . feeding supplement (NEPRO CARB STEADY)  780 mL Per Tube Q24H  . guaiFENesin  15 mL Oral Q12H  . insulin aspart  0-5 Units Subcutaneous QHS  . insulin aspart  0-6 Units Subcutaneous TID WC  . insulin detemir  5 Units Subcutaneous BID  . mouth rinse  15 mL Mouth Rinse QID  . megestrol  200 mg Oral QID  . metoprolol tartrate  37.5 mg Oral BID  . nutrition supplement (JUVEN)  1 packet Per Tube BID BM   . nystatin   Topical BID  . pantoprazole sodium  40 mg Oral Daily  . sodium chloride  2 spray Each Nare Q6H  . sodium chloride flush  10-40 mL Intracatheter Q12H   albumin human, bacitracin, docusate, Gerhardt's butt cream, guaiFENesin-dextromethorphan, hydrOXYzine, levalbuterol, ondansetron (ZOFRAN) IV, oxyCODONE **OR** oxyCODONE, phenol, polyethylene glycol, Resource ThickenUp Clear, silver nitrate applicators, sodium chloride, sodium chloride flush, sodium chloride HYPERTONIC  Assessment/ Plan:  #1.End-stage renal disease from prolonged dialysis dependent acute kidney Injury:Suspected to be multifactorial acute kidney injury from septic shock/MRSA pneumonia and hemorrhagic shock at the outside. Has been on renal replacement therapy since 09/08/2020 and recently able to transition to intermittent hemodialysis from CRRT. Intermittent hemodialysis has been challenging due to hypotension and positional discomfort from decubitus ulcer.Creatinine remains misleadingly low and is indicative of significant lean muscle mass loss with prolonged hospitalization. She is not a candidate for CIR and family have no desire to have her admitted to SNF or LTACand intend to take her home upon discharge.Continue TTS dialysis while here. Last HD on 2/12 with 1000 cc UF.  Seems like urine output is picking up therefore plan to monitor labs next few days, watch for renal recovery.  I do not see any urgent indication for dialysis however labs are pending from 12/14/2020.  We need to make sure that daily labs are ordered  2.Hypokalemia:  Improved.  3.History of Covid pneumonia with ARDS: Status post tracheostomy and respiratory status appears to be stable with supplementation via T-bar.  4.Atrial fibrillation with rapid ventricular response: on metoprolol, tachycardic. On anticoagulation with Eliquis.  5.Severe protein calorie malnutrition with large sacral decubitus ulcer: Ongoing nutritional  supplementation with wound care.  This is probably responsible for the high BUN and azotemia  6CKD-MBD: Hypercalcemia patient was receiving vitamin D products, these have been discontinued. We will try saline diuresis over the next 24 hours  7Anemia: Complicated by GI bleed from perforated duodenal ulcer status post exploratory laparotomy with Phillip Heal patch placement. Received IV iron and continue ESA.  Monitor hemoglobin.  8.  Hypotension patient was on midodrine will discontinue midodrine.  At this  point.  Blood pressure appears to be significantly improved    LOS: Sanders _0 _1 :44 AM

## 2020-12-16 NOTE — Progress Notes (Signed)
Inpatient Diabetes Program Recommendations  AACE/ADA: New Consensus Statement on Inpatient Glycemic Control   Target Ranges:  Prepandial:   less than 140 mg/dL      Peak postprandial:   less than 180 mg/dL (1-2 hours)      Critically ill patients:  140 - 180 mg/dL   Results for SHIELA, BRUNS (MRN [id number]) as of 12/16/2020 10:42  Ref. Range 12/15/2020 07:34 12/15/2020 11:34 12/15/2020 16:41 12/15/2020 20:28 12/16/2020 00:13 12/16/2020 05:03 12/16/2020 08:32  Glucose-Capillary Latest Ref Range: 70 - 99 mg/dL 186 (H) 135 (H) 210 (H) 255 (H) 238 (H) 281 (H) 214 (H)   Review of Glycemic Control  Current orders for Inpatient glycemic control: Levemir 5 units BID, Novolog 0-6 units TID with meals, Novolog 0-5 units QHS; Nepro @ 65 ml/hr 8p-8a  Inpatient Diabetes Program Recommendations:    Insulin: Please consider changing Novolog frequency to 0-6 units Q4H which would allow correction of glucose during night due to hyperglycemia from nocturnal feedings. Also, please consider ordering Novolog 3 units TID (at 20:00, 00:00, and 4:00) for tube feeding infusing during the night.  If tube feeding is stopped or held then Novolog tube feeding coverage should also be stopped or held.  Thanks, Barnie Alderman, RN, MSN, CDE Diabetes Coordinator Inpatient Diabetes Program [phone] (Team Pager from 8am to 5pm)

## 2020-12-17 DIAGNOSIS — I4891 Unspecified atrial fibrillation: Secondary | ICD-10-CM | POA: Diagnosis not present

## 2020-12-17 LAB — GLUCOSE, CAPILLARY
Glucose-Capillary: 202 mg/dL — ABNORMAL HIGH (ref 70–99)
Glucose-Capillary: 212 mg/dL — ABNORMAL HIGH (ref 70–99)
Glucose-Capillary: 226 mg/dL — ABNORMAL HIGH (ref 70–99)
Glucose-Capillary: 247 mg/dL — ABNORMAL HIGH (ref 70–99)
Glucose-Capillary: 249 mg/dL — ABNORMAL HIGH (ref 70–99)
Glucose-Capillary: 266 mg/dL — ABNORMAL HIGH (ref 70–99)
Glucose-Capillary: 270 mg/dL — ABNORMAL HIGH (ref 70–99)

## 2020-12-17 LAB — RENAL FUNCTION PANEL
Albumin: 2.4 g/dL — ABNORMAL LOW (ref 3.5–5.0)
Anion gap: 9 (ref 5–15)
BUN: 60 mg/dL — ABNORMAL HIGH (ref 8–23)
CO2: 20 mmol/L — ABNORMAL LOW (ref 22–32)
Calcium: 11 mg/dL — ABNORMAL HIGH (ref 8.9–10.3)
Chloride: 108 mmol/L (ref 98–111)
Creatinine, Ser: 0.7 mg/dL (ref 0.44–1.00)
GFR, Estimated: >60 mL/min (ref >60)
Glucose, Bld: 256 mg/dL — ABNORMAL HIGH (ref 70–99)
Phosphorus: 2.4 mg/dL — ABNORMAL LOW (ref 2.5–4.6)
Potassium: 4.3 mmol/L (ref 3.5–5.1)
Sodium: 137 mmol/L (ref 135–145)

## 2020-12-17 MED ORDER — PANTOPRAZOLE SODIUM 40 MG PO TBEC: 40.0000 mg | DELAYED_RELEASE_TABLET | Freq: Every day | ORAL | Status: DC

## 2020-12-17 MED ORDER — PANTOPRAZOLE SODIUM 40 MG PO TBEC
40.0000 mg | DELAYED_RELEASE_TABLET | Freq: Every day | ORAL | Status: DC
  Administered 2020-12-18 – 2020-12-23 (×6): 40 mg via ORAL
  Filled 2020-12-17 (×5): qty 1

## 2020-12-17 MED ORDER — INSULIN ASPART 100 UNIT/ML ~~LOC~~ SOLN: 0.0000 [IU] | SUBCUTANEOUS | Status: DC | PRN

## 2020-12-17 NOTE — Plan of Care (Signed)
  Problem: Education: Goal: Utilization of techniques to improve thought processes will improve Outcome: Progressing Goal: Knowledge of the prescribed therapeutic regimen will improve Outcome: Progressing   Problem: Activity: Goal: Interest or engagement in leisure activities will improve Outcome: Progressing Goal: Imbalance in normal sleep/wake cycle will improve Outcome: Progressing   Problem: Coping: Goal: Coping ability will improve Outcome: Progressing Goal: Will verbalize feelings Outcome: Progressing   Problem: Health Behavior/Discharge Planning: Goal: Ability to make decisions will improve Outcome: Progressing Goal: Compliance with therapeutic regimen will improve Outcome: Progressing   Problem: Role Relationship: Goal: Will demonstrate positive changes in social behaviors and relationships Outcome: Progressing   Problem: Safety: Goal: Ability to identify and utilize support systems that promote safety will improve Outcome: Progressing   Problem: Self-Concept: Goal: Level of anxiety will decrease Outcome: Progressing   Problem: Education: Goal: Knowledge of General Education information will improve Description: Including pain rating scale, medication(s)/side effects and non-pharmacologic comfort measures Outcome: Progressing   Problem: Health Behavior/Discharge Planning: Goal: Ability to manage health-related needs will improve Outcome: Progressing   Problem: Clinical Measurements: Goal: Ability to maintain clinical measurements within normal limits will improve Outcome: Progressing Goal: Will remain free from infection Outcome: Progressing Goal: Diagnostic test results will improve Outcome: Progressing Goal: Respiratory complications will improve Outcome: Progressing Goal: Cardiovascular complication will be avoided Outcome: Progressing   Problem: Activity: Goal: Risk for activity intolerance will decrease Outcome: Progressing   Problem:  Nutrition: Goal: Adequate nutrition will be maintained Outcome: Progressing   Problem: Coping: Goal: Level of anxiety will decrease Outcome: Progressing   Problem: Elimination: Goal: Will not experience complications related to bowel motility Outcome: Progressing Goal: Will not experience complications related to urinary retention Outcome: Progressing   Problem: Pain Managment: Goal: General experience of comfort will improve Outcome: Progressing   Problem: Safety: Goal: Ability to remain free from injury will improve Outcome: Progressing   Problem: Skin Integrity: Goal: Risk for impaired skin integrity will decrease Outcome: Progressing   Problem: Activity: Goal: Ability to tolerate increased activity will improve Outcome: Progressing   Problem: Respiratory: Goal: Ability to maintain a clear airway and adequate ventilation will improve Outcome: Progressing   Problem: Role Relationship: Goal: Method of communication will improve Outcome: Progressing   Problem: Education: Goal: Knowledge of disease and its progression will improve Outcome: Progressing   Problem: Fluid Volume: Goal: Compliance with measures to maintain balanced fluid volume will improve Outcome: Progressing   Problem: Health Behavior/Discharge Planning: Goal: Ability to manage health-related needs will improve Outcome: Progressing   Problem: Nutritional: Goal: Ability to make healthy dietary choices will improve Outcome: Progressing   Problem: Clinical Measurements: Goal: Complications related to the disease process, condition or treatment will be avoided or minimized Outcome: Progressing   Problem: Education: Goal: Knowledge of risk factors and measures for prevention of condition will improve Outcome: Progressing   Problem: Coping: Goal: Psychosocial and spiritual needs will be supported Outcome: Progressing   Problem: Respiratory: Goal: Will maintain a patent airway Outcome:  Progressing Goal: Complications related to the disease process, condition or treatment will be avoided or minimized Outcome: Progressing

## 2020-12-17 NOTE — Progress Notes (Signed)
[name] ROUNDING NOTE   Subjective:   Brief History: This is a 70 year old lady with a history of recent Covid pneumonia obesity: Status post colon resection and colostomy bag diabetes mellitus and acute kidney injury requiring dialysis.  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  She required dialysis for several months now with possible signs of renal recovery.  Last dialysis on 2/12  Interval History: Urine output continues to be robust at 2.1 L.  Received Lasix and fluid yesterday.  BUN improved today to 60.  Creatinine is stable.    Objective:  Vital signs in last 24 hours:  Temp:  [98.4 F (36.9 C)-99.6 F (37.6 C)] 98.7 F (37.1 C) (02/18 1141) Pulse Rate:  [100-118] 100 (02/18 1141) Resp:  [19-20] 20 (02/18 1141) BP: (110-132)/(73-87) 118/73 (02/18 1141) SpO2:  [96 %-100 %] 96 % (02/18 1141)  Weight change:  Filed Weights    Intake/Output: I/O last 3 completed shifts: In: 9798 [I.V.:577; NG/GT:467] Out: 63 [Urine:3000; Emesis/NG output:492; Drains:425; Stool:685]   Intake/Output this shift:  Total I/O In: 120 [P.O.:120] Out: -   General:NAD, resting comfortably, has tracheostomy Heart: normal rate Lungs:clear b/l, no crackle Abdomen:soft, Non-tender, non-distended Extremities: Trace dependent edema. Dialysis Access: Right IJ TDC in place. Neurology: Alert awake and following commands.   Basic Metabolic Panel: Recent Labs  Lab 12/11/20 0643 12/12/20 1100 12/13/20 0119 12/14/20 0746 12/15/20 0101 12/16/20 0147 12/17/20 0336  NA 140   < > 132* 133* 134* 133* 137  K 2.6*   < > 4.5 4.3 4.8 4.5 4.3  CL 113*   < > 99 106 104 104 108  CO2 18*   < > 21* 17* 20* 19* 20*  GLUCOSE 103*   < > 234* 281* 197* 263* 256*  BUN 66*   < > 90* 79* 76* 71* 60*  CREATININE 0.64   < > 1.00 0.86 0.77 0.83 0.70  CALCIUM 8.3*   < > 10.0 10.7* 11.5* 11.3* 11.0*  MG 1.2*  --   --   --   --    --   --   PHOS  --    < > 4.1 3.1 2.9 3.1 2.4*   < > = values in this interval not displayed.    Liver Function Tests: Recent Labs  Lab 12/11/20 0643 12/12/20 1100 12/13/20 0119 12/14/20 0746 12/15/20 0101 12/16/20 0147 12/17/20 0336  AST 15  --   --   --   --   --   --   ALT 13  --   --   --   --   --   --   ALKPHOS 63  --   --   --   --   --   --   BILITOT 0.4  --   --   --   --   --   --   PROT 5.8*  --   --   --   --   --   --   ALBUMIN 1.8*   < > 2.4* 2.6* 2.6* 2.4* 2.4*   < > = values in this interval not displayed.   No results for input(s): LIPASE, AMYLASE in the last 168 hours. No results for input(s): AMMONIA in the last 168 hours.  CBC: Recent Labs  Lab 12/11/20 0643 12/16/20 0147  WBC 15.7* 13.4*  HGB 7.9* 9.2*  HCT 25.1* 30.9*  MCV 85.7 87.5  PLT 283 305    Cardiac Enzymes: No results for input(s): CKTOTAL, CKMB, CKMBINDEX, TROPONINI in the last 168 hours.  BNP: Invalid input(s): POCBNP  CBG: Recent Labs  Lab 12/16/20 2015 12/17/20 0109 12/17/20 0310 12/17/20 0741 12/17/20 1135  GLUCAP 277* 226* 249* 247* 270*    Microbiology: Reviewed  Coagulation Studies: No results for input(s): LABPROT, INR in the last 72 hours.  Urinalysis: No results for input(s): COLORURINE, LABSPEC, PHURINE, GLUCOSEU, HGBUR, BILIRUBINUR, KETONESUR, PROTEINUR, UROBILINOGEN, NITRITE, LEUKOCYTESUR in the last 72 hours.  Invalid input(s): APPERANCEUR    Imaging: No results found.   Medications:   . albumin human 25 g (11/16/20 1516)   . apixaban  2.5 mg Oral BID  . carbamide peroxide  10 drop Both EARS BID  . chlorhexidine gluconate (MEDLINE KIT)  15 mL Mouth Rinse BID  . Chlorhexidine Gluconate Cloth  6 each Topical Q0600  . collagenase   Topical BID  . darbepoetin (ARANESP) injection - DIALYSIS  100 mcg Intravenous Q Sat-HD  . diclofenac Sodium  2 g Topical QID  . feeding supplement (NEPRO CARB STEADY)  237 mL Oral TID BM  . feeding supplement  (NEPRO CARB STEADY)  780 mL Per Tube Q24H  . guaiFENesin  15 mL Per Tube Q12H   Or  . guaiFENesin  400 mg Oral Q12H  . insulin aspart  0-5 Units Subcutaneous QHS  . insulin aspart  0-6 Units Subcutaneous TID WC  . insulin detemir  5 Units Subcutaneous BID  . mouth rinse  15 mL Mouth Rinse QID  . megestrol  200 mg Oral QID  . metoprolol tartrate  37.5 mg Oral BID  . nutrition supplement (JUVEN)  1 packet Per Tube BID BM  . nystatin   Topical BID  . oxymetazoline  1 spray Each Nare BID  . [START ON 12/18/2020] pantoprazole  40 mg Oral Daily  . sodium chloride  2 spray Each Nare Q6H  . sodium chloride flush  10-40 mL Intracatheter Q12H   acetaminophen, albumin human, bacitracin, docusate, Gerhardt's butt cream, hydrOXYzine, levalbuterol, ondansetron (ZOFRAN) IV, oxyCODONE **OR** oxyCODONE, phenol, polyethylene glycol, Resource ThickenUp Clear, silver nitrate applicators, sodium chloride, sodium chloride flush, sodium chloride HYPERTONIC  Assessment/ Plan:  #1.Severe AKI deemed ESRD now possible recovery:Suspected to be multifactorial acute kidney injury from septic shock/MRSA pneumonia and hemorrhagic shock at the outside. Has been on renal replacement therapy since 09/08/2020 and recently able to transition to intermittent hemodialysis from CRRT. Intermittent hemodialysis has been challenging due to hypotension and positional discomfort from decubitus ulcer.She is not a candidate for CIR and family have no desire to have her admitted to SNF or LTACand intend to take her home upon discharge.Dialysis now been paused since 2/12 -Continue to monitor BUN; Crt likely low due to poor muscle mass -CTM UOP and hold dialysis; no obvious uremic symptoms -Hold IV hydration today; encourage oral hydration  2.History of Covid pneumonia with ARDS: Status post tracheostomy and respiratory status appears to be stable with supplementation via T-bar.  3.Atrial fibrillation with rapid ventricular  response: on metoprolol, tachycardic. On anticoagulation with Eliquis.  4.Severe protein calorie malnutrition with large sacral decubitus ulcer: Ongoing nutritional supplementation with wound care.  This is probably responsible for the high BUN and azotemia  5. Hypercalcemia: likely related to immobility.  Actual values likely higher due to hypoalbuminemia.  Holding hydration today.  Encourage mobility as tolerated.    6Anemia: Complicated by GI bleed from perforated duodenal ulcer status post exploratory laparotomy  with Phillip Heal patch placement. Received IV iron and continue ESA.  Monitor hemoglobin.  7.  Hypotension patient was on midodrine will discontinue midodrine.  At this point.  Blood pressure appears to be significantly improved    LOS: 107 Reesa Chew @TODAY @12 :36 PM

## 2020-12-17 NOTE — Progress Notes (Signed)
[facility] PROGRESS NOTE   Candace [name] YEM:[id number] DOB: 1951/10/26 DOA: 08/31/2020 PCP: Algis Greenhouse, MD            Status: Remains inpatient appropriate because:Ongoing diagnostic testing needed not appropriate for outpatient work up, Unsafe d/c plan, IV treatments appropriate due to intensity of illness or inability to take PO and Inpatient level of care appropriate due to severity of illness   Dispo: The patient is from: Home              Anticipated d/c is to: Home with home health               Anticipated d/c date is: > 3 days              Patient currently is not medically stable to d/c.  Barriers to discharge: Currently stable off of dialysis but final decision to stop dialysis pending-continues to require hydrotherapy/wound VAC to treat extremely large sacral decubitus-also requiring Cortrak for nocturnal feeds   Considerations for home discharge: 1. Patient will need to continue wound VAC and current wound VAC is apparently not available in the home setting 2. It is likely she will need a specialty bed.  We are currently transitioning back to the [place] rotational bed which will allow for better in bed mobility including the ability to "stand" with PT in the bed without actually getting patient out of bed 3. Likely would need to be decannulated to minimize other care issues after discharge 4. Would need to be able to mobilize for outpatient hemodialysis 5. Would not have aggressive PT OT available in the home setting that she can achieve during hospitalization 6. Patient will have family members that are CNAs managing her after discharge but this would be 24/7 requirement and this could be an overwhelming issue once patient is actually home  Code Status: Full Family Communication: 2/18 daughter [name] at bedside DVT prophylaxis: Eliquis Vaccination status: Has not been vaccinated but did have severe COVID infection initially diagnosed on 08/23/2020.  Patient  will be eligible for COVID-vaccine after 11/23/2020   Foley catheter: No, purewick female urinary collection device   HPI: 70 year old female patient with prior history of diabetes, stage III chronic kidney disease, atrial fibrillation, hypertension, dyslipidemia and colon cancer that is post colectomy and colostomy.  Initially diagnosed with COVID on 08/23/20 she was admitted to the hospital due to dehydration, acute kidney injury and hypoxemic respiratory failure.  She was discharged home on 2 to 3 L of oxygen after being treated with IV steroids and remdesivir.  During that time although she had elevated inflammatory markers she had no embolic or thrombotic disease.  She had been on prophylactic dose heparin during the hospitalization and was placed on Eliquis for 2 weeks for after discharge  Patient returned to the ER on 08/31/2020 for complaints of not feeling well.  She was found to be in atrial fibrillation with RVR, hemorrhagic shock and work-up revealed a perforated duodenal ulcer.  She underwent exploratory laparotomy on 11/3.  She has had an extensive ICU stay with significant events as below.   11/2 Admitted with rapid Afib 11/3 OR with findings of perforated duodenal ulcer 11/10 progressive hemorrhagic shock, intubated, transfused, pressors, proned; started on CRRT in PM 11/16 Extubated. Re-intubated overnight due to respiratory distress and hypoxia with decreased mentation 11/18 Bronch, cultures sent 11/19 Hgb down getting blood 11/24 Spiked fever, resume empirical antimicrobial therapy 11/26 Hemorrhagic shock, hgb 5.6, increased pressors,  11/30 Per  palliative "[name] understanding that patient is unfortunately very fragiledespite ongoing intensive medical treatment and full mechanical support. [name] that the familywantsto continue with all current interventions despite potential outcomes". 12/08 CRRT discontinued due to clotting 12/09 Family requested  transfer to tertiary care [facility]). [facility] denied transfer  12/10 CRRT restarted. Episodes of tachycardia, tachypnea that seem to improve with pain management 12/11 Back in shock. Pressor requirements up. CXR worse. ABX resumed 12/12 Still requiring inc pressors. Had hypoglycemic event. Basal insulin dosing adjusted 12/13 Pressor requirements better. Now hyperglycemic. Re-adjusted Glycemic control  12/19 Afebrile . Remains on dilaudid and heparin gtt, dilaudid gtt increased overnight for concern of pain / ongoing tachycardia, no other events . NE and precedex off 12/17. Ongoing CRRT- even UF, dosing lokelmia this morning 12/20 On CRRT. Renal plans for HD cath and moving to HD. Getting wound care 12/24 - renal stopping CRRT today and plans HD 10/24/20 . 40% fio2 on vent via Trach. TAchypenic and tachycardic. Afebrile but wbc up to 27.6K. On TF. On heparin gtt 12/25 - Back on CRRT. On vent via trach at 40% fio2, On scheduled dilaudid as add on to oxy. Per family request 12/24 - they felt scheduled oxy was not adequate and patient was showing signs of opioid withdrawal.  Patient also had worsening SIRS/sepsis syndrome. Had fever, rising wbc, worsening RR 40-60 and HR 140s sinus->started On abx yesterday. Fever some better today. WBC plateau at 28,.5K. On new levophed ->signifanct improvement in HR 77 and RR t0 20. On heparin gtt. On precedex gtt. On levophed gtt 42mg wthi midodrine. On TF 12/30 Remains on CRRT with intermittent pressor requirements. Ethics consult placed 12/29. Ethics rec time trial of CRRT 12/31 failed SBT with RR 40s. Several conversations between care tam and family, who are upset RE plan of care 1/1 back on pressors  1/4: On pressors, keeping even on CVVHD, HGB drop to 6.9, transfused 1 unit. Improving mental status 1/10 remains on low dose levophed 275m 1/11 off levo, attempting HD with UF for first time. Now tolerating intermittent HD. 1/19 patient transferred to progressive bed  with tracheostomy on 35% FiO2.  has NG tube feeding.  No PEG tube.  Received dialysis on 1/18. 1/24 significant improvement and persistent tachycardia after introduction of twice daily beta-blocker.  Heart rates have improved from the 130s to the 98-104 range 1/24 core track clogged and therefore has been removed by nutrition team.  Calorie count in progress and we are weaning any sedating medications which could be contributing to patient's inability to eat. 1/27 continue w/ cuff #6 trach (eventually downsized to #4) 2/16 Decannulated  Subjective: Patient alert.  Having right foot pain.  Daughter at bedside massaging foot.  Daughter updated that currently there are no beds available on [address] but patient is on the waiting list.  Not complaining of ear pain today.   Objective: Vitals:   12/16/20 2042 12/17/20 0739  BP: 114/75 132/87  Pulse: (!) 108 (!) 114  Resp: 20 19  Temp: 98.5 F (36.9 C) 99.6 F (37.6 C)  SpO2: 100% 100%    Intake/Output Summary (Last 24 hours) at 12/17/2020 [id number] data filed at 12/17/2020 [id number] per 24 hour  Intake 576.97 ml  Output 3527 ml  Net -2950.03 ml   Filed Weights    Exam: Constitutional: Awake, no acute distress, calm Respiratory: Anterior lung sounds clear to auscultation and she is stable on room air.  Previous trach site covered by dressing Cardiovascular: Sounds S1-S2,  continues with resting tachycardia with rates consistently less than 120 bpm and typically in the low 100s, normotensive, no peripheral edema Abdomen: On regular diet with nectar thick liquids- calorie count in progress but based on documentation continues to have poor oral intake.  Tends to do better when daughter at bedside to assist with feeding.  LBM 2/17-cortrack for nocturnal tube feedings Skin:  Massive sacral decubitus ulcer-wound VAC with serosanguineous drainage noted in collection system Neurologic: CN 2-12 grossly intact. Sensation intact, DTR normal. Strength  1/5 x all 4 extremities.  Psychiatric: Awake, alert and oriented x3.  Appropriate mood.   Assessment/Plan: Acute problems: PAF maintaining sinus rhythm w/persistent tachycardia -Continue metoprolol to 37.5 mg BID -Echocardiogram this admission with an EF 50 to 55% with mild diastolic dysfunction parameters and normal RV systolic function -Continue Eliquis-benefits coordinator has determined that Eliquis is covered at [facility] in [place] with a co-pay of $47  Acute respiratory failure secondary to COVID-pneumonia/tracheostomy/acute MSSA tracheobronchitis -Patient stable on low-flow oxygen FiO2 21% -2/16 decannulated -Completed IV Ancef for MSSA tracheobronchitis  Right ear pain/possible impacted cerumen -Evaluated by attending physician over the weekend and was noted to have significant amount of cerumen in ear.  -Started on Debrox 10 drops 2 times daily but continues to have ear pain and now complaining of sore throat -As of 2/17 pain has improved but will add Afrin drops to further decrease eustachian tube pressure.  Acute kidney injury secondary to COVID-related sepsis with shock superimposed on stage III chronic kidney disease -Continue intermittent hemodialysis as directed by the renal team -Avoid nephrotoxic medications -Continue Aranesp on dialysis days -Continue to follow labs and monitor urine output.  -Nephrology team remains hopeful of renal recovery.  Last HD treatment was 2/12 with removal of 1.4 L-as of today creatinine down to 0.77 with elevated BUN -Has not required dialysis since 2/12 therefore has not been given any midodrine.  Pressure stable and actually improved after administration of IV fluids.  Patient does have elevated BUN and likely is somewhat volume depleted.  She does make sincere efforts to drink plenty of fluids by mouth. -Also receiving IV fluids briefly to treat hypercalcemia -2/18 nephrologist at bedside and is hopeful that patient will not require additional  hemodialysis  Lower extremity muscle pain -Continue Voltaren gel.  Patient clarifies this is not a cramping intermittent pain but a constant pain.  Anxiety -Vistaril prn added by attending on 2/10-added administration instruction to give at least 30 minutes prior to dialysis treatment at request of daughter  Dysphagia/moderate to severe protein calorie malnutrition Nutrition Status: Nutrition Problem: Increased nutrient needs Etiology: wound healing Signs/Symptoms: estimated needs Interventions: Refer to RD note for recommendations  Estimated body mass index is 31.04 kg/m as calculated from the following:   Height as of this encounter: 5' 8"  (1.727 m).   Weight as of this encounter: 92.6 kg.  -Continue regular diet with nectar thick liquids -Continue nocturnal tube feedings -Continue Megace and Nepro shakes orally -SLP evaluation 2/13 with recommendation to change to intermittent supervision with meals -2/14 appropriate meds changed to crush and administer with applesauce  Acute hemorrhagic shock secondary to perforated duodenal ulcer/anemia of critical illness (initial reason for admission) -Stable postoperatively -Hemoglobin remains stable greater than 8.0  Diabetes mellitus 2 -Patient has been transitioned to regular diet to facilitate oral intake -CBGs increasing as PO intake increase-2/15 will add Levemir 5 units BID -HgbA1c 08/24/2020 and was 7.0 -Prior to admission patient was on metformin XR-unable to use at this  juncture due to severity of kidney disease -we are hopeful for recovery of renal function  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 -SNF recommended but family wants to take patient home -Patient continues to make small but impressive improvements in mobility daily -Patient now has a power wheelchair which she has been able to utilize as of last week-does require Hoyer lift to transfer-doing well with power wheelchair -Family  request no SNF rehab due to pt prior request to NEVER be placed in any type of facility -2/11 continue therabands to siderails for UE resistance training -PT and OT working on an alternating schedule with one of the other therapy would see the patient daily Monday through Friday prior to hydrotherapy in wound care  Stage IV sacral decubitus -Not present on admission -Wound care nurse following-continue now has wound VAC and hydrotherapy -Surgical team consulted.  Current recommendation is against pursuing general anesthesia to undergo extensive surgical procedure-hopefully can be reevaluated in the future if wound does not heal adequately.  As of 1/28 they have nothing further acutely to add but will be following peripherally. -Continue [place] rotational bed  -Continue oxycodone for pain  -Continue pain assessment every 2 hours   -  11/24/2020 after [facility]      11/22/2020                       11/29/2020     12/06/2020                             12/13/2020   Incision (Closed) 09/01/20 Abdomen (Active)  Date First Assessed/Time First Assessed: 09/01/20 1737   Location: Abdomen    Assessments 09/01/2020  6:25 PM 12/14/2020  8:00 AM  Dressing Type Gauze (Comment) Gauze (Comment)  Dressing - Clean;Dry;Intact  Site / Wound Assessment Dressing in place / Unable to assess Dressing in place / Unable to assess  Drainage Amount None -     No Linked orders to display     Pressure Injury 09/17/20 Ear Left;Anterior;Posterior Stage 2 -  Partial thickness loss of dermis presenting as a shallow open injury with a red, pink wound bed without slough. (Active)  Date First Assessed/Time First Assessed: 09/17/20 0800   Location: Ear  Location Orientation: Left;Anterior;Posterior  Staging: Stage 2 -  Partial thickness loss of dermis presenting as a shallow open injury with a red, pink wound bed without slough. ...    Assessments 09/17/2020  8:00 AM 12/05/2020  8:00 PM  Dressing Type Foam - Lift dressing to assess site  every shift None  Dressing Clean;Dry;Intact -  Dressing Change Frequency Every 3 days -  Site / Wound Assessment Dry;Pink -  Peri-wound Assessment Intact -  Wound Length (cm) 2 cm -  Wound Width (cm) 1 cm -  Wound Depth (cm) 0.25 cm -  Wound Surface Area (cm^2) 2 cm^2 -  Wound Volume (cm^3) 0.5 cm^3 -  Margins Unattached edges (unapproximated) -  Drainage Amount Scant -  Drainage Description Serosanguineous -  Treatment Cleansed -     No Linked orders to display     Pressure Injury 09/25/20 Sacrum Bilateral;Medial Deep Tissue Pressure Injury - Purple or maroon localized area of discolored intact skin or blood-filled blister due to damage of underlying soft tissue from pressure and/or shear. Purple, non-blanchable, b (Active)  Date First Assessed/Time First Assessed: 09/25/20 2000   Location: Sacrum  Location Orientation: Bilateral;Medial  Staging: Deep Tissue  Pressure Injury - Purple or maroon localized area of discolored intact skin or blood-filled blister due to damage o...    Assessments 09/25/2020  5:00 PM 12/15/2020 12:47 PM  Dressing Type Foam - Lift dressing to assess site every shift Barrier Film (skin prep);Negative pressure wound therapy  Dressing Changed;Clean;Dry;Intact Changed  Dressing Change Frequency - Monday, Wednesday, Friday  State of Healing - Early/partial granulation  Site / Wound Assessment - Pink;Yellow;Black;Granulation tissue;Red  % Wound base Red or Granulating - 50%  % Wound base Yellow/Fibrinous Exudate - 45%  % Wound base Black/Eschar - 5%  % Wound base Other/Granulation Tissue (Comment) - 0%  Peri-wound Assessment - Intact  Margins - Unattached edges (unapproximated)  Drainage Amount - Minimal  Drainage Description - Sanguineous  Treatment - Debridement (Selective);Negative pressure wound therapy     No Linked orders to display     Negative Pressure Wound Therapy Sacrum (Active)  Placement Date/Time: 11/22/20 1500   Wound Type: (c) Other (Comment)   Location: Sacrum    Assessments 11/22/2020  4:59 PM 12/17/2020  6:21 AM  Last dressing change 11/22/20 -  Site / Wound Assessment Granulation tissue;Pale;Yellow Clean;Dry  Peri-wound Assessment Intact Intact  Size see above -  Wound filler - Black foam 2 -  Wound filler - White foam 0 -  Wound filler - Nonadherent 0 -  Wound filler - Gauze 0 -  Cycle Continuous -  Target Pressure (mmHg) 125 -  Instillation Volume 26 mL -  Instillation Solution Normal Saline -  Instillation Soak Time 10 minutes -  Instillation Therapy Time 3.5 hours -  Canister Changed No -  Dressing Status Intact Intact  Drainage Amount None None  Output (mL) - 50 mL     No Linked orders to display     Other problems: Coag neg staph-1/2 blood cultures positive -dw ID pharmacist- cultures c/w contamination Vancomycin ordered overnight stopped on 2/8  Acute encephalopathy secondary to prolonged hospital stay -Resolved  History of stage IV colon cancer -Old colostomy  Acute encephalopathy with ICU delirium -Delirium has resolved and supportive meds of Klonopin and Seroquel have been weaned and discontinued  Hypomagnesemia -Continue oral magnesium -1/21 give 4 g magnesium IV x1 -Follow labs  DVT bilateral posterior tibial veins -Was not present during initial COVID admission -Continue Eliquis for now given severe debility and increased risk for developing recurrent DVT  Hypertension -Having issues with orthostasis requiring midodrine Prior to admission patient was on Norvasc  Dyslipidemia -Prior to admission patient was on Crestor-consider resuming soon once patient can swallow pills whole  Abnormal TSH -Nov 2021 TSH was 0.015-TSH and free T4, T3 normal this admission  Data Reviewed: Basic Metabolic Panel: Recent Labs  Lab 12/11/20 0643 12/12/20 1100 12/13/20 0119 12/14/20 0746 12/15/20 0101 12/16/20 0147 12/17/20 0336  NA 140   < > 132* 133* 134* 133* 137  K 2.6*   < > 4.5 4.3 4.8 4.5  4.3  CL 113*   < > 99 106 104 104 108  CO2 18*   < > 21* 17* 20* 19* 20*  GLUCOSE 103*   < > 234* 281* 197* 263* 256*  BUN 66*   < > 90* 79* 76* 71* 60*  CREATININE 0.64   < > 1.00 0.86 0.77 0.83 0.70  CALCIUM 8.3*   < > 10.0 10.7* 11.5* 11.3* 11.0*  MG 1.2*  --   --   --   --   --   --   PHOS  --    < >  4.1 3.1 2.9 3.1 2.4*   < > = values in this interval not displayed.   Liver Function Tests: Recent Labs  Lab 12/11/20 0643 12/12/20 1100 12/13/20 0119 12/14/20 0746 12/15/20 0101 12/16/20 0147 12/17/20 0336  AST 15  --   --   --   --   --   --   ALT 13  --   --   --   --   --   --   ALKPHOS 63  --   --   --   --   --   --   BILITOT 0.4  --   --   --   --   --   --   PROT 5.8*  --   --   --   --   --   --   ALBUMIN 1.8*   < > 2.4* 2.6* 2.6* 2.4* 2.4*   < > = values in this interval not displayed.   No results for input(s): LIPASE, AMYLASE in the last 168 hours. No results for input(s): AMMONIA in the last 168 hours. CBC: Recent Labs  Lab 12/11/20 0643 12/16/20 0147  WBC 15.7* 13.4*  HGB 7.9* 9.2*  HCT 25.1* 30.9*  MCV 85.7 87.5  PLT 283 305   Cardiac Enzymes: No results for input(s): CKTOTAL, CKMB, CKMBINDEX, TROPONINI in the last 168 hours. BNP (last 3 results) Recent Labs    09/07/20 0118 09/08/20 0446 09/09/20 0428  BNP 216.8* 432.5* 609.7*    ProBNP (last 3 results) No results for input(s): PROBNP in the last 8760 hours.  CBG: Recent Labs  Lab 12/16/20 1630 12/16/20 2015 12/17/20 0109 12/17/20 0310 12/17/20 0741  GLUCAP 236* 277* 226* 249* 247*    No results found for this or any previous visit (from the past 240 hour(s)).   Studies: No results found.  Scheduled Meds: . apixaban  2.5 mg Oral BID  . carbamide peroxide  10 drop Both EARS BID  . chlorhexidine gluconate (MEDLINE KIT)  15 mL Mouth Rinse BID  . Chlorhexidine Gluconate Cloth  6 each Topical Q0600  . collagenase   Topical BID  . darbepoetin (ARANESP) injection - DIALYSIS  100  mcg Intravenous Q Sat-HD  . diclofenac Sodium  2 g Topical QID  . feeding supplement (NEPRO CARB STEADY)  237 mL Oral TID BM  . feeding supplement (NEPRO CARB STEADY)  780 mL Per Tube Q24H  . guaiFENesin  15 mL Per Tube Q12H   Or  . guaiFENesin  400 mg Oral Q12H  . insulin aspart  0-5 Units Subcutaneous QHS  . insulin aspart  0-6 Units Subcutaneous TID WC  . insulin detemir  5 Units Subcutaneous BID  . mouth rinse  15 mL Mouth Rinse QID  . megestrol  200 mg Oral QID  . metoprolol tartrate  37.5 mg Oral BID  . nutrition supplement (JUVEN)  1 packet Per Tube BID BM  . nystatin   Topical BID  . oxymetazoline  1 spray Each Nare BID  . pantoprazole sodium  40 mg Oral Daily  . sodium chloride  2 spray Each Nare Q6H  . sodium chloride flush  10-40 mL Intracatheter Q12H   Continuous Infusions: . sodium chloride 100 mL/hr at 12/16/20 1904  . albumin human 25 g (11/16/20 1516)    Principal Problem:   Atrial fibrillation with RVR ([place]) Active Problems:   Acute respiratory failure due to COVID-19 [facility])   New onset a-fib ([place])  Leukocytosis   Atrial fibrillation with rapid ventricular response (H[facility])   Hypoxia   Pressure injury of skin   Acute hypoxemic respiratory failure (H[facility])   ARDS (adult respiratory distress syndrome) (H[facility])   Perforated duodenal ulcer ([place])   On mechanically assisted ventilation ([place])   Palliative care by specialist   Goals of care, counseling/discussion   Shock ([place])   Acute and chronic respiratory failure (acute-on-chronic) ([place])   Status post tracheostomy ([place])   ESRD (end stage renal disease) ([place])   HAP (hospital-acquired pneumonia)   Consultants:  Cardiology  Surgery  Nephrology  Ethics  PCCM   Procedures: R PICC 11/5 >> A line 11/9 >> out ETT 11/9 > 11/16, 11/16 >> 09/21/2020, 09/21/2020 tracheostomy>> Lt [place] CVL 11/9 >> R IJ trialysis >> out HD catheter 12/1 >>12/20 12/21 - 14.5 Fr, 23 cm right IJ tunneled hemodialysis catheter  placement. Removal of indwelling subclavian catheter.   Antibiotics: Anti-infectives (From admission, onward)   Start     Dose/Rate Route Frequency Ordered Stop   12/07/20 1200  vancomycin (VANCOCIN) IVPB 1000 mg/200 mL premix  Status:  Discontinued        1,000 mg 200 mL/hr over 60 Minutes Intravenous Every T-Th-Sa (Hemodialysis) 12/06/20 2156 12/07/20 1113   12/06/20 2245  vancomycin (VANCOREADY) IVPB 2000 mg/400 mL        2,000 mg 200 mL/hr over 120 Minutes Intravenous  Once 12/06/20 2156 12/07/20 0211   11/27/20 2000  ceFAZolin (ANCEF) IVPB 1 g/50 mL premix        1 g 100 mL/hr over 30 Minutes Intravenous Every 24 hours 11/26/20 1248 12/03/20 2136   11/26/20 1345  ceFAZolin (ANCEF) IVPB 1 g/50 mL premix        1 g 100 mL/hr over 30 Minutes Intravenous  Once 11/26/20 1248 11/26/20 1433   11/17/20 1415  fluconazole (DIFLUCAN) 40 MG/ML suspension 200 mg        200 mg Oral  Once 11/17/20 1315 11/17/20 1357   11/03/20 2200  meropenem (MERREM) 500 mg in sodium chloride 0.9 % 100 mL IVPB        500 mg 200 mL/hr over 30 Minutes Intravenous Every 24 hours 11/03/20 1500 11/06/20 2217   11/01/20 1000  anidulafungin (ERAXIS) 100 mg in sodium chloride 0.9 % 100 mL IVPB  Status:  Discontinued       "Followed by" Linked Group Details   100 mg 78 mL/hr over 100 Minutes Intravenous Every 24 hours 10/31/20 0916 11/01/20 1404   10/31/20 1015  meropenem (MERREM) 1 g in sodium chloride 0.9 % 100 mL IVPB  Status:  Discontinued        1 g 200 mL/hr over 30 Minutes Intravenous Every 8 hours 10/31/20 0916 11/03/20 1500   10/31/20 1015  linezolid (ZYVOX) IVPB 600 mg  Status:  Discontinued        600 mg 300 mL/hr over 60 Minutes Intravenous Every 12 hours 10/31/20 0916 11/02/20 0906   10/31/20 1015  anidulafungin (ERAXIS) 200 mg in sodium chloride 0.9 % 200 mL IVPB       "Followed by" Linked Group Details   200 mg 78 mL/hr over 200 Minutes Intravenous  Once 10/31/20 0916 10/31/20 1630   10/25/20 1800   ceFAZolin (ANCEF) IVPB 2g/100 mL premix  Status:  Discontinued        2 g 200 mL/hr over 30 Minutes Intravenous Every 12 hours 10/25/20 1022 10/30/20 1103   10/23/20 2000  vancomycin (VANCOREADY) IVPB 750 mg/150  mL  Status:  Discontinued        750 mg 150 mL/hr over 60 Minutes Intravenous Every 24 hours 10/22/20 2036 10/25/20 1022   10/23/20 1800  piperacillin-tazobactam (ZOSYN) IVPB 3.375 g  Status:  Discontinued        3.375 g 100 mL/hr over 30 Minutes Intravenous Every 6 hours 10/23/20 1155 10/24/20 1422   10/23/20 0200  piperacillin-tazobactam (ZOSYN) IVPB 3.375 g  Status:  Discontinued        3.375 g 100 mL/hr over 30 Minutes Intravenous Every 8 hours 10/22/20 2036 10/23/20 1155   10/22/20 1630  vancomycin (VANCOREADY) IVPB 1500 mg/300 mL        1,500 mg 150 mL/hr over 120 Minutes Intravenous  Once 10/22/20 1537 10/22/20 2229   10/22/20 1630  piperacillin-tazobactam (ZOSYN) IVPB 2.25 g  Status:  Discontinued        2.25 g 100 mL/hr over 30 Minutes Intravenous Every 8 hours 10/22/20 1537 10/22/20 2036   10/22/20 1537  vancomycin variable dose per unstable renal function (pharmacist dosing)  Status:  Discontinued         Does not apply See admin instructions 10/22/20 1537 10/22/20 2036   10/19/20 0600  ceFAZolin (ANCEF) IVPB 2g/100 mL premix        2 g 200 mL/hr over 30 Minutes Intravenous To Radiology 10/18/20 1457 10/19/20 0938   10/10/20 0830  vancomycin (VANCOCIN) IVPB 1000 mg/200 mL premix       "Followed by" Linked Group Details   1,000 mg 200 mL/hr over 60 Minutes Intravenous Every 24 hours 10/09/20 0744 10/15/20 0902   10/09/20 0830  piperacillin-tazobactam (ZOSYN) IVPB 3.375 g        3.375 g 100 mL/hr over 30 Minutes Intravenous Every 6 hours 10/09/20 0744 10/16/20 0038   10/09/20 0830  vancomycin (VANCOREADY) IVPB 2000 mg/400 mL       "Followed by" Linked Group Details   2,000 mg 200 mL/hr over 120 Minutes Intravenous  Once 10/09/20 0744 10/09/20 1022   09/24/20 1000   ceFAZolin (ANCEF) IVPB 2g/100 mL premix  Status:  Discontinued        2 g 200 mL/hr over 30 Minutes Intravenous Every 12 hours 09/24/20 0801 09/24/20 1046   09/23/20 1400  vancomycin (VANCOCIN) IVPB 1000 mg/200 mL premix        1,000 mg 200 mL/hr over 60 Minutes Intravenous Every 24 hours 09/22/20 1436 09/28/20 1718   09/22/20 2200  ceFEPIme (MAXIPIME) 2 g in sodium chloride 0.9 % 100 mL IVPB  Status:  Discontinued        2 g 200 mL/hr over 30 Minutes Intravenous Every 12 hours 09/22/20 1436 09/24/20 0801   09/22/20 1030  ceFEPIme (MAXIPIME) 1 g in sodium chloride 0.9 % 100 mL IVPB        1 g 200 mL/hr over 30 Minutes Intravenous  Once 09/22/20 0934 09/22/20 1145   09/22/20 1015  vancomycin (VANCOCIN) IVPB 1000 mg/200 mL premix        1,000 mg 200 mL/hr over 60 Minutes Intravenous  Once 09/22/20 0934 09/22/20 1446   09/12/20 2200  ceFEPIme (MAXIPIME) 2 g in sodium chloride 0.9 % 100 mL IVPB        2 g 200 mL/hr over 30 Minutes Intravenous Every 12 hours 09/12/20 0732 09/14/20 2134   09/11/20 1400  ceFEPIme (MAXIPIME) 2 g in sodium chloride 0.9 % 100 mL IVPB  Status:  Discontinued        2 g  200 mL/hr over 30 Minutes Intravenous Every 8 hours 09/11/20 1126 09/12/20 0732   09/08/20 1000  vancomycin (VANCOREADY) IVPB 2000 mg/400 mL        2,000 mg 200 mL/hr over 120 Minutes Intravenous  Once 09/08/20 0857 09/08/20 1224   09/08/20 1000  ceFEPIme (MAXIPIME) 2 g in sodium chloride 0.9 % 100 mL IVPB  Status:  Discontinued        2 g 200 mL/hr over 30 Minutes Intravenous Every 12 hours 09/08/20 0857 09/11/20 1126   09/08/20 0856  vancomycin variable dose per unstable renal function (pharmacist dosing)  Status:  Discontinued         Does not apply See admin instructions 09/08/20 0857 09/09/20 0935   09/02/20 1600  cefTRIAXone (ROCEPHIN) 1 g in sodium chloride 0.9 % 100 mL IVPB  Status:  Discontinued        1 g 200 mL/hr over 30 Minutes Intravenous Every 24 hours 09/01/20 1811 09/02/20 0838    09/01/20 1800  fluconazole (DIFLUCAN) IVPB 400 mg        400 mg 50 mL/hr over 240 Minutes Intravenous  Once 09/01/20 1749 09/02/20 0603   09/01/20 1530  piperacillin-tazobactam (ZOSYN) IVPB 3.375 g        3.375 g 12.5 mL/hr over 240 Minutes Intravenous Every 8 hours 09/01/20 1514 09/05/20 2111   09/01/20 1000  levofloxacin (LEVAQUIN) tablet 250 mg  Status:  Discontinued        250 mg Oral Daily 08/31/20 1508 08/31/20 1735   08/31/20 1730  cefTRIAXone (ROCEPHIN) 1 g in sodium chloride 0.9 % 100 mL IVPB  Status:  Discontinued        1 g 200 mL/hr over 30 Minutes Intravenous Every 24 hours 08/31/20 1726 09/01/20 1513   08/31/20 1730  azithromycin (ZITHROMAX) 500 mg in sodium chloride 0.9 % 250 mL IVPB  Status:  Discontinued        500 mg 250 mL/hr over 60 Minutes Intravenous Every 24 hours 08/31/20 1726 09/02/20 0838       Time spent: 45 minutes    [name] ANP  [facility] 7 am - 330 pm/M-F for direct patient care and secure chat Please refer to Amion for contact info 107  days

## 2020-12-17 NOTE — Progress Notes (Signed)
Physical Therapy Wound Treatment Patient Details  Name: Candace [name] MRN: [id number] Date of Birth: 07-08-1951  Today's Date: 12/17/2020 Time: 7494-4967 Time Calculation (min): 45 min  Subjective  Subjective: Pt and daughter pleasant and agreeable to hydrotherapy and express gratitude and excitement with progress wound has made. Patient and Family Stated Goals: heal wound Date of Onset:  (unknown) Prior Treatments: prior hydro and Dakins  Pain Score: Pt appears comfortable throughout session with minimal discomfort noted during dressing change.  Wound Assessment  Pressure Injury 09/25/20 Sacrum Bilateral;Medial Deep Tissue Pressure Injury - Purple or maroon localized area of discolored intact skin or blood-filled blister due to damage of underlying soft tissue from pressure and/or shear. Purple, non-blanchable, b (Active)  Dressing Type Negative pressure wound therapy 12/17/20 1359  Dressing Changed;Clean;Dry;Intact 12/17/20 1359  Dressing Change Frequency Daily 12/17/20 1359  State of Healing Early/partial granulation 12/17/20 1359  Site / Wound Assessment Yellow;Pink;Granulation tissue 12/17/20 1359  % Wound base Red or Granulating 65% 12/17/20 1359  % Wound base Yellow/Fibrinous Exudate 25% 12/17/20 1359  % Wound base Black/Eschar 0% 12/17/20 1359  % Wound base Other/Granulation Tissue (Comment) 10% 12/17/20 1359  Peri-wound Assessment Intact 12/17/20 1359  Wound Length (cm) 14 cm 12/01/20 1722  Wound Width (cm) 12 cm 12/01/20 1722  Wound Depth (cm) 6 cm 12/01/20 1722  Wound Surface Area (cm^2) 168 cm^2 12/01/20 1722  Wound Volume (cm^3) 1008 cm^3 12/01/20 1722  Tunneling (cm) 0 12/01/20 1722  Undermining (cm) 0 12/01/20 1722  Margins Unattached edges (unapproximated) 12/17/20 1359  Drainage Amount Minimal 12/17/20 1359  Drainage Description Serosanguineous 12/17/20 1359  Treatment Debridement (Selective);Negative pressure wound therapy 12/17/20 1359     Negative Pressure  Wound Therapy Sacrum (Active)  Last dressing change 12/17/20 12/17/20 1359  Site / Wound Assessment Clean;Dry 12/17/20 0621  Peri-wound Assessment Intact 12/17/20 1359  Size see above 12/17/20 1359  Wound filler - Black foam 4 12/17/20 1359  Wound filler - White foam 0 12/17/20 1359  Wound filler - Nonadherent 0 12/17/20 1359  Wound filler - Gauze 0 12/17/20 1359  Cycle Continuous 12/17/20 1359  Target Pressure (mmHg) 125 12/17/20 1359  Instillation Volume 26 mL 12/17/20 1359  Instillation Solution Normal Saline 12/17/20 1359  Instillation Soak Time 10 minutes 12/17/20 1359  Instillation Therapy Time 3.5 hours 12/17/20 1359  Canister Changed No 12/17/20 1359  Machine plugged into wall outlet (NOT bed outlet) Yes 12/17/20 1359  Dressing Status Intact 12/17/20 1359  Drainage Amount Minimal 12/17/20 1359  Drainage Description Serosanguineous 12/17/20 1359  Output (mL) 50 mL 12/17/20 5916      Selective Debridement Selective Debridement - Location: sacrum Selective Debridement - Tools Used: Forceps;Scalpel Selective Debridement - Tissue Removed: yellow unviable tissue   Wound Assessment and Plan  Wound Therapy - Assess/Plan/Recommendations Wound Therapy - Clinical Statement: Wound bed continues to improve in appearance. Up to 65% granulation tissue present with some fascia/connective tissue present at the base of the wound. Feel wound is likely close to ready for a regular VAC. PT reached out to Val Riles, [facility] to arrange time to assess the wound together next week. Will continue with current plan of care. Wound Therapy - Functional Problem List: Global weakness and immobility Factors Delaying/Impairing Wound Healing: Diabetes Mellitus;Immobility;Multiple medical problems;Other (comment) (poor nutrition) Hydrotherapy Plan: Debridement;Pulsatile lavage with suction;Patient/family education;Dressing change Wound Therapy - Frequency: 6X / week Wound Therapy - Follow Up  Recommendations: Skilled nursing facility Wound Plan: see above  Wound Therapy Goals- Improve the function  of patient's integumentary system by progressing the wound(s) through the phases of wound healing (inflammation - proliferation - remodeling) by: Decrease Necrotic Tissue to: 20 Decrease Necrotic Tissue - Progress: Progressing toward goal Increase Granulation Tissue to: 80 Increase Granulation Tissue - Progress: Progressing toward goal Goals/treatment plan/discharge plan were made with and agreed upon by patient/family: Yes Time For Goal Achievement: 7 days Wound Therapy - Potential for Goals: Fair  Goals will be updated until maximal potential achieved or discharge criteria met.  Discharge criteria: when goals achieved, discharge from hospital, MD decision/surgical intervention, no progress towards goals, refusal/missing three consecutive treatments without notification or medical reason.  GP     Thelma Comp 12/17/2020, 2:15 PM   Rolinda Roan, PT, DPT Acute Rehabilitation Services Pager: [phone] Office: [phone]

## 2020-12-17 NOTE — Progress Notes (Signed)
Physical Therapy Treatment Patient Details Name: Candace [name] MRN: [id number] DOB: August 12, 1951 Today's Date: 12/17/2020    History of Present Illness Pt 70 y.o. female with medical history significant for recent covid pna, obesity, colon cancer s/p colon resection with colostomy bag, HLD, NIDDM2, CKD3 presented to ED after her follow-up nurse advised her to present to ED for elevated HR. +afib, elevated troponins with demand ischemia,  CT scan is positive for bowel perforation with pneumomediastinum 11/03 exp lap with repair of perforated ulcer. 11/10 t/f to ICU- intubated/sedated/proned. Trach placed 11/24. CRRT off 12/8, restarted 12/10-1/5. 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  Decannulated 12/15/20    PT Comments    PT and hydro worked on scheduling Candace [name]'s mobility before her hydro therapy as sessions in the past have been quite painful when we come to mobilize her after hydrotherapy.  She remained still, very painful today unable to tolerate much work in fully upright sitting in her tilt in space power chair even with roho in place and adjusted for comfort.  At the end of nearly an hour long therapy session we ultimately had to lift her back to bed to be on her side for pain tolerance.  Pt's daughter and I spoke about several different plans, PT for mobility to come even earlier in the 8-8:30 window and pre medicate with oxy for mobility, then hydro therapy coming after 4+ hours so that she can have another dose of oxy for hydro.  The last time she had two does of oxy in a short period of time, it left her rather lethargic into the next day.  I also reached out to Candace Hearing, NP to talk about the possibility of PMR consult for pain management and she agreed to reach out to Dr. Naaman Plummer.  PT will continue to follow acutely for safe mobility progression.  Daughter was fully engaged and assisting with all  aspects of care and therapy.     Follow Up Recommendations  SNF     Equipment Recommendations  Hospital bed;Other (comment);Wheelchair cushion (measurements PT);Wheelchair (measurements PT) (hoyer lift, air mattress overlay, custom WC and cushion)    Recommendations for Other Services       Precautions / Restrictions Precautions Precautions: Fall Precaution Comments: baseline R colostomy; sacral wound with wound vac    Mobility  Bed Mobility Overal bed mobility: Needs Assistance Bed Mobility: Rolling Rolling: Max assist;+2 for physical assistance         General bed mobility comments: Continuing to encourage active knee flexion and reaching across body with arm to roll side to side for lift pad placement and removal.    Transfers Overall transfer level: Needs assistance Equipment used: Ambulation equipment used             General transfer comment: lifted OOB to the WC (preference was WC today for pain managment (has cushion and tilt) and back to bed at end of therapy as pt was unable to tolerate being up any longer due to pain.  Ambulation/Gait                 Theme park manager mobility: Yes Wheelchair Assistance Details (indicate cue type and reason): Continued practice with pt controlling functionality of the chair, today's focus was on self tilting to help with pain/pressure on her bottom,  turning the chair on/off.  Some cues needed, long fingernails also a hurdle to pushing the buttons.  Modified Rankin (Stroke Patients Only)       Balance Overall balance assessment: Needs assistance Sitting-balance support: Bilateral upper extremity supported Sitting balance-Leahy Scale: Zero Sitting balance - Comments: two person max assist to pull up to sitting with trunk unsupported.  Daughter in front giving pt hands to pull against while therapist at her back helping her come to fully unsupported  sitting.  WC brought up to fully upright, pt only able to tolerate two pull to sits before buttocks pain was too much and had to tilt back to do UE/LE AA and resisted exercises.                                    Cognition Arousal/Alertness: Awake/alert Behavior During Therapy: Flat affect Overall Cognitive Status: Impaired/Different from baseline Area of Impairment: Following commands;Problem solving                   Current Attention Level: Selective   Following Commands: Follows one step commands with increased time;Follows one step commands consistently     Problem Solving: Slow processing        Exercises General Exercises - Upper Extremity Shoulder Flexion: AAROM;Both;10 reps Elbow Flexion: AAROM;Both;10 reps (therapist and daughter provided light resistance) Elbow Extension: AAROM;Both;10 reps (therapist and daughter provided light resistance.) General Exercises - Lower Extremity Short Arc Quad: AAROM;Both;10 reps Heel Slides: AAROM;Both;10 reps (with resisted push back into extension-light resistance)    General Comments        Pertinent Vitals/Pain Pain Assessment: Faces Faces Pain Scale: Hurts whole lot Pain Location: bottom Pain Descriptors / Indicators: Guarding;Grimacing Pain Intervention(s): Limited activity within patient's tolerance;Monitored during session;Repositioned    Home Living                      Prior Function            PT Goals (current goals can now be found in the care plan section) Acute Rehab PT Goals Patient Stated Goal: daughter would like her OOB Progress towards PT goals: Progressing toward goals    Frequency    Min 3X/week (seeing 3xs/wk as pt is going home with family)      PT Plan Current plan remains appropriate    Co-evaluation              AM-PAC PT "6 Clicks" Mobility   Outcome Measure  Help needed turning from your back to your side while in a flat bed without using  bedrails?: A Lot Help needed moving from lying on your back to sitting on the side of a flat bed without using bedrails?: Total Help needed moving to and from a bed to a chair (including a wheelchair)?: Total Help needed standing up from a chair using your arms (e.g., wheelchair or bedside chair)?: Total Help needed to walk in hospital room?: Total Help needed climbing 3-5 steps with a railing? : Total 6 Click Score: 7    End of Session   Activity Tolerance: Patient limited by pain Patient left: in bed;with call bell/phone within reach;with family/visitor present Nurse Communication: Mobility status;Need for lift equipment;Other (comment) (pt unable to tolerate staying up, so back in bed) PT Visit Diagnosis: Muscle weakness (generalized) (M62.81);Difficulty in walking, not elsewhere classified (R26.2);Pain;Adult, failure to thrive (R62.7);Unsteadiness on feet (R26.81) Pain -  Right/Left: Right Pain - part of body: Knee     Time: 1000-1110 PT Time Calculation (min) (ACUTE ONLY): 70 min  Charges:  $Therapeutic Exercise: 23-37 mins $Therapeutic Activity: 38-52 mins                     Verdene Lennert, PT, DPT  Acute Rehabilitation [phone] pager #[phone] office     12/17/2020, 12:49 PM

## 2020-12-17 NOTE — Progress Notes (Signed)
Inpatient Diabetes Program Recommendations  AACE/ADA: New Consensus Statement on Inpatient Glycemic Control   Target Ranges:  Prepandial:   less than 140 mg/dL      Peak postprandial:   less than 180 mg/dL (1-2 hours)      Critically ill patients:  140 - 180 mg/dL    Review of Glycemic Control Results for Candace, [name] (MRN [id number]) as of 12/17/2020 09:31  Ref. Range 12/16/2020 08:32 12/16/2020 12:12 12/16/2020 16:30 12/16/2020 20:15 12/17/2020 01:09 12/17/2020 03:10 12/17/2020 07:41  Glucose-Capillary Latest Ref Range: 70 - 99 mg/dL 214 (H) 216 (H) 236 (H) 277 (H) 226 (H) 249 (H) 247 (H)   Current orders for Inpatient glycemic control: Levemir 5 units BID, Novolog 0-6 units TID with meals, Novolog 0-5 units QHS; Nepro @ 65 ml/hr 8p-8a  Inpatient Diabetes Program Recommendations:    While on Tube Feeds:  - Change Novolog 0-6 to Q4 hours -  Add Novolog 3 units TID (at 20:00, 00:00, and 4:00) for tube feeding infusing during the night.    If tube feeding is stopped or held then Novolog tube feeding coverage should also be stopped or held.  Thanks, Tama Headings RN, MSN, BC-ADM Inpatient Diabetes Coordinator Team Pager [phone] (8a-5p)

## 2020-12-18 DIAGNOSIS — I4891 Unspecified atrial fibrillation: Secondary | ICD-10-CM | POA: Diagnosis not present

## 2020-12-18 LAB — RENAL FUNCTION PANEL
Albumin: 2.3 g/dL — ABNORMAL LOW (ref 3.5–5.0)
Anion gap: 7 (ref 5–15)
BUN: 51 mg/dL — ABNORMAL HIGH (ref 8–23)
CO2: 20 mmol/L — ABNORMAL LOW (ref 22–32)
Calcium: 11.2 mg/dL — ABNORMAL HIGH (ref 8.9–10.3)
Chloride: 109 mmol/L (ref 98–111)
Creatinine, Ser: 0.65 mg/dL (ref 0.44–1.00)
GFR, Estimated: >60 mL/min (ref >60)
Glucose, Bld: 254 mg/dL — ABNORMAL HIGH (ref 70–99)
Phosphorus: 2.7 mg/dL (ref 2.5–4.6)
Potassium: 4.9 mmol/L (ref 3.5–5.1)
Sodium: 136 mmol/L (ref 135–145)

## 2020-12-18 LAB — GLUCOSE, CAPILLARY
Glucose-Capillary: 250 mg/dL — ABNORMAL HIGH (ref 70–99)
Glucose-Capillary: 231 mg/dL — ABNORMAL HIGH (ref 70–99)
Glucose-Capillary: 193 mg/dL — ABNORMAL HIGH (ref 70–99)
Glucose-Capillary: 263 mg/dL — ABNORMAL HIGH (ref 70–99)
Glucose-Capillary: 183 mg/dL — ABNORMAL HIGH (ref 70–99)

## 2020-12-18 MED ORDER — INSULIN DETEMIR 100 UNIT/ML ~~LOC~~ SOLN
12.0000 [IU] | Freq: Two times a day (BID) | SUBCUTANEOUS | Status: DC
  Administered 2020-12-18 – 2020-12-23 (×10): 12 [IU] via SUBCUTANEOUS
  Filled 2020-12-18 (×12): qty 0.12

## 2020-12-18 MED ORDER — INSULIN DETEMIR 100 UNIT/ML ~~LOC~~ SOLN
8.0000 [IU] | Freq: Two times a day (BID) | SUBCUTANEOUS | Status: DC
  Administered 2020-12-18: 5 [IU] via SUBCUTANEOUS
  Filled 2020-12-18 (×4): qty 0.08

## 2020-12-18 NOTE — Progress Notes (Signed)
PROGRESS NOTE  Candace [name] OVZ:[id number] DOB: 06-18-1951 DOA: 08/31/2020 PCP: Algis Greenhouse, MD  Brief History   70 year old female patient with prior history of diabetes, stage III chronic kidney disease, atrial fibrillation, hypertension, dyslipidemia and colon cancer that is post colectomy and colostomy.  Initially diagnosed with COVID on 08/23/20 she was admitted to the hospital due to dehydration, acute kidney injury and hypoxemic respiratory failure.  She was discharged home on 2 to 3 L of oxygen after being treated with IV steroids and remdesivir.  During that time although she had elevated inflammatory markers she had no embolic or thrombotic disease.  She had been on prophylactic dose heparin during the hospitalization and was placed on Eliquis for 2 weeks for after discharge  Patient returned to the ER on 08/31/2020 for complaints of not feeling well.  She was found to be in atrial fibrillation with RVR, hemorrhagic shock and work-up revealed a perforated duodenal ulcer.  She underwent exploratory laparotomy on 11/3.  She has had an extensive ICU stay with significant events as below.   11/2 Admitted with rapid Afib 11/3 OR with findings of perforated duodenal ulcer 11/10 progressive hemorrhagic shock, intubated, transfused, pressors, proned; started on CRRT in PM 11/16 Extubated. Re-intubated overnight due to respiratory distress and hypoxia with decreased mentation 11/18 Bronch, cultures sent 11/19 Hgb down getting blood 11/24 Spiked fever, resume empirical antimicrobial therapy 11/26 Hemorrhagic shock, hgb 5.6, increased pressors,  11/30 Per palliative "[name] understanding that patient is unfortunately very fragiledespite ongoing intensive medical treatment and full mechanical support. [name] that the familywantsto continue with all current interventions despite potential outcomes". 12/08 CRRT discontinued due to clotting 12/09 Family requested transfer to  tertiary care [facility]). [facility] denied transfer  12/10 CRRT restarted. Episodes of tachycardia, tachypnea that seem to improve with pain management 12/11 Back in shock. Pressor requirements up. CXR worse. ABX resumed 12/12 Still requiring inc pressors. Had hypoglycemic event. Basal insulin dosing adjusted 12/13 Pressor requirements better. Now hyperglycemic. Re-adjusted Glycemic control  12/19 Afebrile . Remains on dilaudid and heparin gtt, dilaudid gtt increased overnight for concern of pain / ongoing tachycardia, no other events . NE and precedex off 12/17. Ongoing CRRT- even UF, dosing lokelmia this morning 12/20 On CRRT. Renal plans for HD cath and moving to HD. Getting wound care 12/24 - renal stopping CRRT today and plans HD 10/24/20 . 40% fio2 on vent via Trach. TAchypenic and tachycardic. Afebrile but wbc up to 27.6K. On TF. On heparin gtt 12/25 - Back on CRRT. On vent via trach at 40% fio2, On scheduled dilaudid as add on to oxy. Per family request 12/24 - they felt scheduled oxy was not adequate and patient was showing signs of opioid withdrawal.  Patient also had worsening SIRS/sepsis syndrome. Had fever, rising wbc, worsening RR 40-60 and HR 140s sinus->started On abx yesterday. Fever some better today. WBC plateau at 28,.5K. On new levophed ->signifanct improvement in HR 77 and RR t0 20. On heparin gtt. On precedex gtt. On levophed gtt 83mg wthi midodrine. On TF 12/30 Remains on CRRT with intermittent pressor requirements. Ethics consult placed 12/29. Ethics rec time trial of CRRT 12/31 failed SBT with RR 40s. Several conversations between care tam and family, who are upset RE plan of care 1/1 back on pressors  1/4: On pressors, keeping even on CVVHD, HGB drop to 6.9, transfused 1 unit. Improving mental status 1/10 remains on low dose levophed 25m 1/11 off levo, attempting HD with UF for first time. Now  tolerating intermittent HD. 1/19 patient transferred to progressive bed with  tracheostomy on 35% FiO2. has NG tube feeding. No PEG tube. Received dialysis on 1/18. 1/24 significant improvement and persistent tachycardia after introduction of twice daily beta-blocker.  Heart rates have improved from the 130s to the 98-104 range 1/24 core track clogged and therefore has been removed by nutrition team.  Calorie count in progress and we are weaning any sedating medications which could be contributing to patient's inability to eat. 1/27 continue w/ cuff #6 trach (eventually downsized to #4) 2/16 Decannulated  Consultants  . WOC . Cardiology . Psychiatry . Palliative Care . Nephrology . PCCM  Procedures  . Intubation . Transfusion . Extubation . Bronchoscopy . CRRT . Pressors . Hemodialysis . Tracheostomy . Cortrak placement . Decannulated.  Antibiotics   Anti-infectives (From admission, onward)   Start     Dose/Rate Route Frequency Ordered Stop   12/07/20 1200  vancomycin (VANCOCIN) IVPB 1000 mg/200 mL premix  Status:  Discontinued        1,000 mg 200 mL/hr over 60 Minutes Intravenous Every T-Th-Sa (Hemodialysis) 12/06/20 2156 12/07/20 1113   12/06/20 2245  vancomycin (VANCOREADY) IVPB 2000 mg/400 mL        2,000 mg 200 mL/hr over 120 Minutes Intravenous  Once 12/06/20 2156 12/07/20 0211   11/27/20 2000  ceFAZolin (ANCEF) IVPB 1 g/50 mL premix        1 g 100 mL/hr over 30 Minutes Intravenous Every 24 hours 11/26/20 1248 12/03/20 2136   11/26/20 1345  ceFAZolin (ANCEF) IVPB 1 g/50 mL premix        1 g 100 mL/hr over 30 Minutes Intravenous  Once 11/26/20 1248 11/26/20 1433   11/17/20 1415  fluconazole (DIFLUCAN) 40 MG/ML suspension 200 mg        200 mg Oral  Once 11/17/20 1315 11/17/20 1357   11/03/20 2200  meropenem (MERREM) 500 mg in sodium chloride 0.9 % 100 mL IVPB        500 mg 200 mL/hr over 30 Minutes Intravenous Every 24 hours 11/03/20 1500 11/06/20 2217   11/01/20 1000  anidulafungin (ERAXIS) 100 mg in sodium chloride 0.9 % 100 mL IVPB   Status:  Discontinued       "Followed by" Linked Group Details   100 mg 78 mL/hr over 100 Minutes Intravenous Every 24 hours 10/31/20 0916 11/01/20 1404   10/31/20 1015  meropenem (MERREM) 1 g in sodium chloride 0.9 % 100 mL IVPB  Status:  Discontinued        1 g 200 mL/hr over 30 Minutes Intravenous Every 8 hours 10/31/20 0916 11/03/20 1500   10/31/20 1015  linezolid (ZYVOX) IVPB 600 mg  Status:  Discontinued        600 mg 300 mL/hr over 60 Minutes Intravenous Every 12 hours 10/31/20 0916 11/02/20 0906   10/31/20 1015  anidulafungin (ERAXIS) 200 mg in sodium chloride 0.9 % 200 mL IVPB       "Followed by" Linked Group Details   200 mg 78 mL/hr over 200 Minutes Intravenous  Once 10/31/20 0916 10/31/20 1630   10/25/20 1800  ceFAZolin (ANCEF) IVPB 2g/100 mL premix  Status:  Discontinued        2 g 200 mL/hr over 30 Minutes Intravenous Every 12 hours 10/25/20 1022 10/30/20 1103   10/23/20 2000  vancomycin (VANCOREADY) IVPB 750 mg/150 mL  Status:  Discontinued        750 mg 150 mL/hr over 60 Minutes Intravenous Every  24 hours 10/22/20 2036 10/25/20 1022   10/23/20 1800  piperacillin-tazobactam (ZOSYN) IVPB 3.375 g  Status:  Discontinued        3.375 g 100 mL/hr over 30 Minutes Intravenous Every 6 hours 10/23/20 1155 10/24/20 1422   10/23/20 0200  piperacillin-tazobactam (ZOSYN) IVPB 3.375 g  Status:  Discontinued        3.375 g 100 mL/hr over 30 Minutes Intravenous Every 8 hours 10/22/20 2036 10/23/20 1155   10/22/20 1630  vancomycin (VANCOREADY) IVPB 1500 mg/300 mL        1,500 mg 150 mL/hr over 120 Minutes Intravenous  Once 10/22/20 1537 10/22/20 2229   10/22/20 1630  piperacillin-tazobactam (ZOSYN) IVPB 2.25 g  Status:  Discontinued        2.25 g 100 mL/hr over 30 Minutes Intravenous Every 8 hours 10/22/20 1537 10/22/20 2036   10/22/20 1537  vancomycin variable dose per unstable renal function (pharmacist dosing)  Status:  Discontinued         Does not apply See admin instructions  10/22/20 1537 10/22/20 2036   10/19/20 0600  ceFAZolin (ANCEF) IVPB 2g/100 mL premix        2 g 200 mL/hr over 30 Minutes Intravenous To Radiology 10/18/20 1457 10/19/20 0938   10/10/20 0830  vancomycin (VANCOCIN) IVPB 1000 mg/200 mL premix       "Followed by" Linked Group Details   1,000 mg 200 mL/hr over 60 Minutes Intravenous Every 24 hours 10/09/20 0744 10/15/20 0902   10/09/20 0830  piperacillin-tazobactam (ZOSYN) IVPB 3.375 g        3.375 g 100 mL/hr over 30 Minutes Intravenous Every 6 hours 10/09/20 0744 10/16/20 0038   10/09/20 0830  vancomycin (VANCOREADY) IVPB 2000 mg/400 mL       "Followed by" Linked Group Details   2,000 mg 200 mL/hr over 120 Minutes Intravenous  Once 10/09/20 0744 10/09/20 1022   09/24/20 1000  ceFAZolin (ANCEF) IVPB 2g/100 mL premix  Status:  Discontinued        2 g 200 mL/hr over 30 Minutes Intravenous Every 12 hours 09/24/20 0801 09/24/20 1046   09/23/20 1400  vancomycin (VANCOCIN) IVPB 1000 mg/200 mL premix        1,000 mg 200 mL/hr over 60 Minutes Intravenous Every 24 hours 09/22/20 1436 09/28/20 1718   09/22/20 2200  ceFEPIme (MAXIPIME) 2 g in sodium chloride 0.9 % 100 mL IVPB  Status:  Discontinued        2 g 200 mL/hr over 30 Minutes Intravenous Every 12 hours 09/22/20 1436 09/24/20 0801   09/22/20 1030  ceFEPIme (MAXIPIME) 1 g in sodium chloride 0.9 % 100 mL IVPB        1 g 200 mL/hr over 30 Minutes Intravenous  Once 09/22/20 0934 09/22/20 1145   09/22/20 1015  vancomycin (VANCOCIN) IVPB 1000 mg/200 mL premix        1,000 mg 200 mL/hr over 60 Minutes Intravenous  Once 09/22/20 0934 09/22/20 1446   09/12/20 2200  ceFEPIme (MAXIPIME) 2 g in sodium chloride 0.9 % 100 mL IVPB        2 g 200 mL/hr over 30 Minutes Intravenous Every 12 hours 09/12/20 0732 09/14/20 2134   09/11/20 1400  ceFEPIme (MAXIPIME) 2 g in sodium chloride 0.9 % 100 mL IVPB  Status:  Discontinued        2 g 200 mL/hr over 30 Minutes Intravenous Every 8 hours 09/11/20 1126  09/12/20 0732   09/08/20 1000  vancomycin (VANCOREADY)  IVPB 2000 mg/400 mL        2,000 mg 200 mL/hr over 120 Minutes Intravenous  Once 09/08/20 0857 09/08/20 1224   09/08/20 1000  ceFEPIme (MAXIPIME) 2 g in sodium chloride 0.9 % 100 mL IVPB  Status:  Discontinued        2 g 200 mL/hr over 30 Minutes Intravenous Every 12 hours 09/08/20 0857 09/11/20 1126   09/08/20 0856  vancomycin variable dose per unstable renal function (pharmacist dosing)  Status:  Discontinued         Does not apply See admin instructions 09/08/20 0857 09/09/20 0935   09/02/20 1600  cefTRIAXone (ROCEPHIN) 1 g in sodium chloride 0.9 % 100 mL IVPB  Status:  Discontinued        1 g 200 mL/hr over 30 Minutes Intravenous Every 24 hours 09/01/20 1811 09/02/20 0838   09/01/20 1800  fluconazole (DIFLUCAN) IVPB 400 mg        400 mg 50 mL/hr over 240 Minutes Intravenous  Once 09/01/20 1749 09/02/20 0603   09/01/20 1530  piperacillin-tazobactam (ZOSYN) IVPB 3.375 g        3.375 g 12.5 mL/hr over 240 Minutes Intravenous Every 8 hours 09/01/20 1514 09/05/20 2111   09/01/20 1000  levofloxacin (LEVAQUIN) tablet 250 mg  Status:  Discontinued        250 mg Oral Daily 08/31/20 1508 08/31/20 1735   08/31/20 1730  cefTRIAXone (ROCEPHIN) 1 g in sodium chloride 0.9 % 100 mL IVPB  Status:  Discontinued        1 g 200 mL/hr over 30 Minutes Intravenous Every 24 hours 08/31/20 1726 09/01/20 1513   08/31/20 1730  azithromycin (ZITHROMAX) 500 mg in sodium chloride 0.9 % 250 mL IVPB  Status:  Discontinued        500 mg 250 mL/hr over 60 Minutes Intravenous Every 24 hours 08/31/20 1726 09/02/20 0838    .   Subjective  This patient is resting comfortably. No new complaints.  Objective   Vitals:  Vitals:   12/18/20 1217 12/18/20 1218  BP: 122/76 122/76  Pulse:  88  Resp:  15  Temp:  99.3 F (37.4 C)  SpO2:  100%    Exam:  Constitutional:  . The patient is awake, alert, and oriented x 3. No acute distress. Respiratory:  . No  increased work of breathing. . No wheezes, rales, or rhonchi . No tactile fremitus Cardiovascular:  . Regular rate and rhythm . No murmurs, ectopy, or gallups. . No lateral PMI. No thrills. Abdomen:  . Abdomen is soft, non-tender, non-distended . No hernias, masses, or organomegaly . Normoactive bowel sounds.  Musculoskeletal:  . No cyanosis, clubbing, or edema Skin:  . No rashes, lesions, ulcers . palpation of skin: no induration or nodules Neurologic:  . CN 2-12 intact . Sensation all 4 extremities intact Psychiatric:  . Mental status o Mood, affect appropriate o Orientation to person, place, time  . judgment and insight appear intact  I have personally reviewed the following:   Today's Data  . Vitals, BMP, Glucoses  Micro Data  . Blood cultures x 2 1/2 positive for staph epidermidis  Imaging  . CXR  Cardiology Data  . EKG 12/15/2020  Scheduled Meds: . apixaban  2.5 mg Oral BID  . carbamide peroxide  10 drop Both EARS BID  . chlorhexidine gluconate (MEDLINE KIT)  15 mL Mouth Rinse BID  . Chlorhexidine Gluconate Cloth  6 each Topical Q0600  . collagenase  Topical BID  . darbepoetin (ARANESP) injection - DIALYSIS  100 mcg Intravenous Q Sat-HD  . diclofenac Sodium  2 g Topical QID  . feeding supplement (NEPRO CARB STEADY)  237 mL Oral TID BM  . feeding supplement (NEPRO CARB STEADY)  780 mL Per Tube Q24H  . guaiFENesin  15 mL Per Tube Q12H   Or  . guaiFENesin  400 mg Oral Q12H  . insulin aspart  0-5 Units Subcutaneous QHS  . insulin detemir  8 Units Subcutaneous BID  . mouth rinse  15 mL Mouth Rinse QID  . megestrol  200 mg Oral QID  . metoprolol tartrate  37.5 mg Oral BID  . nutrition supplement (JUVEN)  1 packet Per Tube BID BM  . nystatin   Topical BID  . oxymetazoline  1 spray Each Nare BID  . pantoprazole  40 mg Oral Daily  . sodium chloride  2 spray Each Nare Q6H  . sodium chloride flush  10-40 mL Intracatheter Q12H   Continuous Infusions: .  albumin human 25 g (11/16/20 1516)    Principal Problem:   Atrial fibrillation with RVR (HCC) Active Problems:   Acute respiratory failure due to COVID-19 (HCC)   New onset a-fib (HCC)   Leukocytosis   Atrial fibrillation with rapid ventricular response (HCC)   Hypoxia   Pressure injury of skin   Acute hypoxemic respiratory failure (HCC)   ARDS (adult respiratory distress syndrome) (HCC)   Perforated duodenal ulcer ([place])   On mechanically assisted ventilation ([place])   Palliative care by specialist   Goals of care, counseling/discussion   Shock ([place])   Acute and chronic respiratory failure (acute-on-chronic) ([place])   Status post tracheostomy ([place])   ESRD (end stage renal disease) ([place])   HAP (hospital-acquired pneumonia)   LOS: 108 days    A & P  PAF maintaining sinus rhythm w/persistent tachycardia -Continue metoprolol to 37.5 mg BID -Echocardiogram this admission with an EF 50 to 55% with mild diastolic dysfunction parameters and normal RV systolic function -Continue Eliquis-benefits coordinator has determined that Eliquis is covered at [facility] in [place] with a co-pay of $47  Acute respiratory failure secondary to COVID-pneumonia/tracheostomy/acute MSSA tracheobronchitis -Patient stable on low-flow oxygen FiO2 21% -2/16 decannulated -Completed IV Ancef for MSSA tracheobronchitis  Right ear pain/possible impacted cerumen -Evaluated by attending physician over the weekend and was noted to have significant amount of cerumen in ear.  -Started on Debrox 10 drops 2 times daily but continues to have ear pain and now complaining of sore throat -As of 2/17 pain has improved but will add Afrin drops to further decrease eustachian tube pressure.  Acute kidney injury secondary to COVID-related sepsis with shock superimposed on stage III chronic kidney disease -Continue intermittent hemodialysis as directed by the renal team -Avoid nephrotoxic medications -Continue Aranesp on dialysis  days -Continue to follow labs and monitor urine output.  -Nephrology team remains hopeful of renal recovery.  Last HD treatment was 2/12 with removal of 1.4 L-as of today creatinine down to 0.77 with elevated BUN -Has not required dialysis since 2/12 therefore has not been given any midodrine.  Pressure stable and actually improved after administration of IV fluids.  Patient does have elevated BUN and likely is somewhat volume depleted.  She does make sincere efforts to drink plenty of fluids by mouth. -Also receiving IV fluids briefly to treat hypercalcemia -2/18 nephrologist at bedside and is hopeful that patient will not require additional hemodialysis  Lower extremity muscle pain -Continue  Voltaren gel.  Patient clarifies this is not a cramping intermittent pain but a constant pain.  Anxiety -Vistaril prn added by attending on 2/10-added administration instruction to give at least 30 minutes prior to dialysis treatment at request of daughter  Dysphagia/moderate to severe protein calorie malnutrition Nutrition Status: Nutrition Problem: Increased nutrient needs Etiology: wound healing Signs/Symptoms: estimated needs Interventions: Refer to RD note for recommendations  Estimated body mass index is 31.04 kg/m as calculated from the following:   Height as of this encounter: 5' 8" (1.727 m).   Weight as of this encounter: 92.6 kg.  -Continue regular diet with nectar thick liquids -Continue nocturnal tube feedings -Continue Megace and Nepro shakes orally -SLP evaluation 2/13 with recommendation to change to intermittent supervision with meals -2/14 appropriate meds changed to crush and administer with applesauce  Acute hemorrhagic shock secondary to perforated duodenal ulcer/anemia of critical illness (initial reason for admission) -Stable postoperatively -Hemoglobin remains stable greater than 8.0  Diabetes mellitus 2 -Patient has been transitioned to regular diet to facilitate  oral intake -CBGs increasing as PO intake increase-2/15 will add Levemir 5 units BID -HgbA1c 08/24/2020 and was 7.0 -Prior to admission patient was on metformin XR-unable to use at this juncture due to severity of kidney disease -we are hopeful for recovery of renal function  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 -SNF recommended but family wants to take patient home -Patient continues to make small but impressive improvements in mobility daily -Patient now has a power wheelchair which she has been able to utilize as of last week-does require Hoyer lift to transfer-doing well with power wheelchair -Family request no SNF rehab due to pt prior request to NEVER be placed in any type of facility -2/11 continue therabands to siderails for UE resistance training -PT and OT working on an alternating schedule with one of the other therapy would see the patient daily Monday through Friday prior to hydrotherapy in wound care  Stage IV sacral decubitus -Not present on admission -Wound care nurse following-continue now has wound VAC and hydrotherapy -Surgical team consulted.  Current recommendation is against pursuing general anesthesia to undergo extensive surgical procedure-hopefully can be reevaluated in the future if wound does not heal adequately.  As of 1/28 they have nothing further acutely to add but will be following peripherally. -Continue Kreg rotational bed  -Continue oxycodone for pain  -Continue pain assessment every 2 hours   Coag neg staph-1/2 blood cultures positive -dw ID pharmacist- cultures c/w contamination Vancomycin ordered overnight stopped on 2/8  Acute encephalopathy secondary to prolonged hospital stay -Resolved  History of stage IV colon cancer -Old colostomy  Acute encephalopathy with ICU delirium -Delirium has resolved and supportive meds of Klonopin and Seroquel have been weaned and discontinued  Hypomagnesemia -Continue  oral magnesium -1/21 give 4 g magnesium IV x1 -Follow labs  DVT bilateral posterior tibial veins -Was not present during initial COVID admission -Continue Eliquis for now given severe debility and increased risk for developing recurrent DVT  Hypertension -Having issues with orthostasis requiring midodrine Prior to admission patient was on Norvasc  Dyslipidemia -Prior to admission patient was on Crestor-consider resuming soon once patient can swallow pills whole  Abnormal TSH -Nov 2021 TSH was 0.015-TSH and free T4, T3 normal this admission  I have seen and examined this patient myself. I have spent 34 minutes in her evaluation and care.  DVT Prophylaxis: Eliquis CODE STATUS: Full Code Family Communication: None available today. Disposition: The patient is from: Home  Anticipated d/c is to: Home with home health   Anticipated d/c date is: > 3 days  Patient currently is not medically stable to d/c.  Barriers to discharge: Currently stable off of dialysis but final decision to stop dialysis pending-continues to require hydrotherapy/wound VAC to treat extremely large sacral decubitus-also requiring Cortrak for nocturnal feeds  Considerations for home discharge: 1. Patient will need to continue wound VAC and current wound VAC is apparently not available in the home setting 2. It is likely she will need a specialty bed.  We are currently transitioning back to the [place] rotational bed which will allow for better in bed mobility including the ability to "stand" with PT in the bed without actually getting patient out of bed 3. Likely would need to be decannulated to minimize other care issues after discharge 4. Would need to be able to mobilize for outpatient hemodialysis 5. Would not have aggressive PT OT available in the home setting that she can achieve during hospitalization 6. Patient will have family members that are CNAs managing her after  discharge but this would be 24/7 requirement and this could be an overwhelming issue once patient is actually home   , DO [facility] Direct contact: see [email or web address]  7PM-7AM contact night coverage as above 12/18/2020, 1:54 PM  LOS: 108 days

## 2020-12-18 NOTE — Progress Notes (Signed)
[facility] ROUNDING NOTE   Subjective:   Brief History: This is a 70 year old lady with a history of recent Covid pneumonia obesity: Status post colon resection and colostomy bag diabetes mellitus and acute kidney injury requiring dialysis.  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  She required dialysis for several months now with possible signs of renal recovery.  Last dialysis on 2/12  Interval History: Urine output continues to be good over the last 24 hours at 1.2 L.  BUN improving.  Calcium stable.   Objective:  Vital signs in last 24 hours:  Temp:  [98.7 F (37.1 C)-99.7 F (37.6 C)] 99.7 F (37.6 C) (02/19 0353) Pulse Rate:  [98-114] 105 (02/19 0353) Resp:  [17-20] 20 (02/19 0353) BP: (118-130)/(72-86) 130/79 (02/19 0353) SpO2:  [96 %-100 %] 99 % (02/19 0353)  Weight change:  Filed Weights    Intake/Output: I/O last 3 completed shifts: In: 146 [P.O.:120; Other:26] Out: 1610 [Urine:2725; Emesis/NG output:492; Drains:375; Stool:260]   Intake/Output this shift:  Total I/O In: 240 [P.O.:240] Out: -   General:NAD, resting comfortably, has tracheostomy Heart: normal rate Lungs:clear b/l, no crackle Abdomen:soft, Non-tender, non-distended Extremities: Trace dependent edema. Dialysis Access: Right IJ TDC in place. Neurology: Alert awake and following commands.   Basic Metabolic Panel: Recent Labs  Lab 12/14/20 0746 12/15/20 0101 12/16/20 0147 12/17/20 0336 12/18/20 0144  NA 133* 134* 133* 137 136  K 4.3 4.8 4.5 4.3 4.9  CL 106 104 104 108 109  CO2 17* 20* 19* 20* 20*  GLUCOSE 281* 197* 263* 256* 254*  BUN 79* 76* 71* 60* 51*  CREATININE 0.86 0.77 0.83 0.70 0.65  CALCIUM 10.7* 11.5* 11.3* 11.0* 11.2*  PHOS 3.1 2.9 3.1 2.4* 2.7    Liver Function Tests: Recent Labs  Lab 12/14/20 0746 12/15/20 0101 12/16/20 0147 12/17/20 0336 12/18/20 0144  ALBUMIN 2.6* 2.6* 2.4* 2.4*  2.3*   No results for input(s): LIPASE, AMYLASE in the last 168 hours. No results for input(s): AMMONIA in the last 168 hours.  CBC: Recent Labs  Lab 12/16/20 0147  WBC 13.4*  HGB 9.2*  HCT 30.9*  MCV 87.5  PLT 305    Cardiac Enzymes: No results for input(s): CKTOTAL, CKMB, CKMBINDEX, TROPONINI in the last 168 hours.  BNP: Invalid input(s): POCBNP  CBG: Recent Labs  Lab 12/17/20 1634 12/17/20 1950 12/17/20 2340 12/18/20 0402 12/18/20 0832  GLUCAP 266* 202* 212* 250* 231*    Microbiology: Reviewed  Coagulation Studies: No results for input(s): LABPROT, INR in the last 72 hours.  Urinalysis: No results for input(s): COLORURINE, LABSPEC, PHURINE, GLUCOSEU, HGBUR, BILIRUBINUR, KETONESUR, PROTEINUR, UROBILINOGEN, NITRITE, LEUKOCYTESUR in the last 72 hours.  Invalid input(s): APPERANCEUR    Imaging: No results found.   Medications:   . albumin human 25 g (11/16/20 1516)   . apixaban  2.5 mg Oral BID  . carbamide peroxide  10 drop Both EARS BID  . chlorhexidine gluconate (MEDLINE KIT)  15 mL Mouth Rinse BID  . Chlorhexidine Gluconate Cloth  6 each Topical Q0600  . collagenase   Topical BID  . darbepoetin (ARANESP) injection - DIALYSIS  100 mcg Intravenous Q Sat-HD  . diclofenac Sodium  2 g Topical QID  . feeding supplement (NEPRO CARB STEADY)  237 mL Oral TID BM  . feeding supplement (NEPRO CARB STEADY)  780 mL Per Tube Q24H  . guaiFENesin  15 mL Per Tube Q12H   Or  .  guaiFENesin  400 mg Oral Q12H  . insulin aspart  0-5 Units Subcutaneous QHS  . insulin detemir  8 Units Subcutaneous BID  . mouth rinse  15 mL Mouth Rinse QID  . megestrol  200 mg Oral QID  . metoprolol tartrate  37.5 mg Oral BID  . nutrition supplement (JUVEN)  1 packet Per Tube BID BM  . nystatin   Topical BID  . oxymetazoline  1 spray Each Nare BID  . pantoprazole  40 mg Oral Daily  . sodium chloride  2 spray Each Nare Q6H  . sodium chloride flush  10-40 mL Intracatheter Q12H    acetaminophen, albumin human, bacitracin, docusate, Gerhardt's butt cream, hydrOXYzine, insulin aspart, levalbuterol, ondansetron (ZOFRAN) IV, oxyCODONE **OR** oxyCODONE, phenol, polyethylene glycol, Resource ThickenUp Clear, silver nitrate applicators, sodium chloride, sodium chloride flush, sodium chloride HYPERTONIC  Assessment/ Plan:  #1.Severe AKI deemed ESRD now possible recovery:Suspected to be multifactorial acute kidney injury from septic shock/MRSA pneumonia and hemorrhagic shock at the outside. Has been on renal replacement therapy since 09/08/2020 and recently able to transition to intermittent hemodialysis from CRRT. Intermittent hemodialysis has been challenging due to hypotension and positional discomfort from decubitus ulcer.She is not a candidate for CIR and family have no desire to have her admitted to SNF or LTACand intend to take her home upon discharge.Dialysis now been paused since 2/12 -Continue to monitor BUN; Crt likely low due to poor muscle mass -CTM UOP and hold dialysis; no obvious uremic symptoms -Encourage oral hydration  2.History of Covid pneumonia with ARDS: Status post tracheostomy and respiratory status appears to be stable with supplementation via T-bar.  3.Atrial fibrillation with rapid ventricular response: on metoprolol, tachycardic. On anticoagulation with Eliquis.  4.Severe protein calorie malnutrition with large sacral decubitus ulcer: Ongoing nutritional supplementation with wound care.    5. Hypercalcemia: likely related to immobility.  Actual values likely higher due to hypoalbuminemia.  Holding IV hydration for now and encourage oral hydration  6Anemia: Complicated by GI bleed from perforated duodenal ulcer status post exploratory laparotomy with Phillip Heal patch placement. Received IV iron and continue ESA.  Monitor hemoglobin.  7.  Hypotension patient was on midodrine will discontinue midodrine.  At this point.  Blood pressure  appears to be significantly improved    LOS: 108 Westen Dinino J Ardis Fullwood @TODAY @10 :42 AM

## 2020-12-19 DIAGNOSIS — I4891 Unspecified atrial fibrillation: Secondary | ICD-10-CM | POA: Diagnosis not present

## 2020-12-19 LAB — RENAL FUNCTION PANEL
Albumin: 2.4 g/dL — ABNORMAL LOW (ref 3.5–5.0)
Anion gap: 7 (ref 5–15)
BUN: 40 mg/dL — ABNORMAL HIGH (ref 8–23)
CO2: 19 mmol/L — ABNORMAL LOW (ref 22–32)
Calcium: 11.4 mg/dL — ABNORMAL HIGH (ref 8.9–10.3)
Chloride: 108 mmol/L (ref 98–111)
Creatinine, Ser: 0.61 mg/dL (ref 0.44–1.00)
GFR, Estimated: >60 mL/min (ref >60)
Glucose, Bld: 199 mg/dL — ABNORMAL HIGH (ref 70–99)
Phosphorus: 3.2 mg/dL (ref 2.5–4.6)
Potassium: 4.7 mmol/L (ref 3.5–5.1)
Sodium: 134 mmol/L — ABNORMAL LOW (ref 135–145)

## 2020-12-19 LAB — GLUCOSE, CAPILLARY
Glucose-Capillary: 138 mg/dL — ABNORMAL HIGH (ref 70–99)
Glucose-Capillary: 140 mg/dL — ABNORMAL HIGH (ref 70–99)
Glucose-Capillary: 155 mg/dL — ABNORMAL HIGH (ref 70–99)
Glucose-Capillary: 213 mg/dL — ABNORMAL HIGH (ref 70–99)
Glucose-Capillary: 242 mg/dL — ABNORMAL HIGH (ref 70–99)
Glucose-Capillary: 98 mg/dL (ref 70–99)

## 2020-12-19 MED ORDER — DARBEPOETIN ALFA 100 MCG/0.5ML IJ SOSY
100.0000 ug | PREFILLED_SYRINGE | INTRAMUSCULAR | Status: DC
Start: 2020-12-19 — End: 2020-12-20
  Administered 2020-12-19: 100 ug via SUBCUTANEOUS
  Filled 2020-12-19: qty 0.5

## 2020-12-19 NOTE — Progress Notes (Signed)
[facility] ROUNDING NOTE   Subjective:   Brief History: This is a 70 year old lady with a history of recent Covid pneumonia obesity: Status post colon resection and colostomy bag diabetes mellitus and acute kidney injury requiring dialysis.  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  She required dialysis for several months now with possible signs of renal recovery.  Last dialysis on 2/12  Interval History: Urine output remains adequate.  No labs today.  Patient without complaint   Objective:  Vital signs in last 24 hours:  Temp:  [98.2 F (36.8 C)-99.4 F (37.4 C)] 98.2 F (36.8 C) (02/20 0816) Pulse Rate:  [88-109] 104 (02/20 0816) Resp:  [15-20] 19 (02/20 0816) BP: (122-135)/(76-82) 127/80 (02/20 0816) SpO2:  [98 %-100 %] 100 % (02/20 0816)  Weight change:  Filed Weights    Intake/Output: I/O last 3 completed shifts: In: 240 [P.O.:240] Out: 725 [Urine:525; Stool:200]   Intake/Output this shift:  Total I/O In: -  Out: 700 [Urine:700]  General:NAD, resting comfortably, has tracheostomy Heart: normal rate Lungs:clear b/l, no crackle Abdomen:soft, Non-tender, non-distended Extremities: Trace dependent edema. Dialysis Access: Right IJ TDC in place. Neurology: Alert awake and following commands.   Basic Metabolic Panel: Recent Labs  Lab 12/15/20 0101 12/16/20 0147 12/17/20 0336 12/18/20 0144 12/19/20 0957  NA 134* 133* 137 136 134*  K 4.8 4.5 4.3 4.9 4.7  CL 104 104 108 109 108  CO2 20* 19* 20* 20* 19*  GLUCOSE 197* 263* 256* 254* 199*  BUN 76* 71* 60* 51* 40*  CREATININE 0.77 0.83 0.70 0.65 0.61  CALCIUM 11.5* 11.3* 11.0* 11.2* 11.4*  PHOS 2.9 3.1 2.4* 2.7 3.2    Liver Function Tests: Recent Labs  Lab 12/15/20 0101 12/16/20 0147 12/17/20 0336 12/18/20 0144 12/19/20 0957  ALBUMIN 2.6* 2.4* 2.4* 2.3* 2.4*   No results for input(s): LIPASE, AMYLASE in the last 168  hours. No results for input(s): AMMONIA in the last 168 hours.  CBC: Recent Labs  Lab 12/16/20 0147  WBC 13.4*  HGB 9.2*  HCT 30.9*  MCV 87.5  PLT 305    Cardiac Enzymes: No results for input(s): CKTOTAL, CKMB, CKMBINDEX, TROPONINI in the last 168 hours.  BNP: Invalid input(s): POCBNP  CBG: Recent Labs  Lab 12/18/20 1545 12/18/20 1926 12/19/20 0023 12/19/20 0329 12/19/20 0851  GLUCAP 263* 183* 140* 138* 98    Microbiology: Reviewed  Coagulation Studies: No results for input(s): LABPROT, INR in the last 72 hours.  Urinalysis: No results for input(s): COLORURINE, LABSPEC, PHURINE, GLUCOSEU, HGBUR, BILIRUBINUR, KETONESUR, PROTEINUR, UROBILINOGEN, NITRITE, LEUKOCYTESUR in the last 72 hours.  Invalid input(s): APPERANCEUR    Imaging: No results found.   Medications:   . albumin human 25 g (11/16/20 1516)   . apixaban  2.5 mg Oral BID  . carbamide peroxide  10 drop Both EARS BID  . chlorhexidine gluconate (MEDLINE KIT)  15 mL Mouth Rinse BID  . Chlorhexidine Gluconate Cloth  6 each Topical Q0600  . collagenase   Topical BID  . darbepoetin (ARANESP) injection - NON-DIALYSIS  100 mcg Subcutaneous Q Sun-1800  . diclofenac Sodium  2 g Topical QID  . feeding supplement (NEPRO CARB STEADY)  237 mL Oral TID BM  . feeding supplement (NEPRO CARB STEADY)  780 mL Per Tube Q24H  . guaiFENesin  15 mL Per Tube Q12H   Or  . guaiFENesin  400 mg Oral Q12H  . insulin aspart  0-5 Units Subcutaneous QHS  . insulin detemir  12 Units Subcutaneous BID  . mouth rinse  15 mL Mouth Rinse QID  . megestrol  200 mg Oral QID  . metoprolol tartrate  37.5 mg Oral BID  . nutrition supplement (JUVEN)  1 packet Per Tube BID BM  . nystatin   Topical BID  . pantoprazole  40 mg Oral Daily  . sodium chloride  2 spray Each Nare Q6H  . sodium chloride flush  10-40 mL Intracatheter Q12H   acetaminophen, albumin human, bacitracin, docusate, Gerhardt's butt cream, hydrOXYzine, insulin aspart,  levalbuterol, ondansetron (ZOFRAN) IV, oxyCODONE **OR** oxyCODONE, phenol, polyethylene glycol, Resource ThickenUp Clear, silver nitrate applicators, sodium chloride, sodium chloride flush, sodium chloride HYPERTONIC  Assessment/ Plan:  #1.Severe AKI deemed ESRD now possible recovery:Suspected to be multifactorial acute kidney injury from septic shock/MRSA pneumonia and hemorrhagic shock at the outside. Has been on renal replacement therapy since 09/08/2020 and recently able to transition to intermittent hemodialysis from CRRT. Intermittent hemodialysis has been challenging due to hypotension and positional discomfort from decubitus ulcer.She is not a candidate for CIR and family have no desire to have her admitted to SNF or LTACand intend to take her home upon discharge.Dialysis now been paused since 2/12 -Continue to monitor BUN; Crt likely low due to poor muscle mass -CTM UOP and hold dialysis; no obvious uremic symptoms -Encourage oral hydration -If patient continues to improve next week we can possibly sign off  2.History of Covid pneumonia with ARDS: Status post tracheostomy and respiratory status appears to be stable with supplementation via T-bar.  3.Atrial fibrillation with rapid ventricular response: on metoprolol, tachycardic. On anticoagulation with Eliquis.  4.Severe protein calorie malnutrition with large sacral decubitus ulcer: Ongoing nutritional supplementation with wound care.    5. Hypercalcemia: likely related to immobility.  Actual values likely higher due to hypoalbuminemia.  Holding IV hydration for now and encourage oral hydration.  Continue to monitor  6Anemia: Complicated by GI bleed from perforated duodenal ulcer status post exploratory laparotomy with Phillip Heal patch placement. Received IV iron and continue ESA.  Monitor hemoglobin.  7.  Hypotension patient was on midodrine will discontinue midodrine.  At this point.  Blood pressure appears to be  significantly improved    LOS: 109 Joleen Stuckert J Sweetie Giebler @TODAY @10 :43 AM

## 2020-12-19 NOTE — Progress Notes (Signed)
Pain medicine given to patient (10mg  Oxy) per patients request. Before going to get medicine I asked patient if she wanted me to bring all of her medicine together, patient said "no bring pain med first" which is what I did, when I brought the pain medicine I asked the patient what time she wanted her other meds (daughter present for both conversations with patient and myself) patient responded and said she wanted all of her medicine now. I clarified with daughter to see if she remembered me asking patient what she wanted to do before I went and got the pain med, daughter agreed that the patient said she did not want her pain medicine with her other meds. So with that patient will wait until 9 o'clock when I come back.

## 2020-12-19 NOTE — Progress Notes (Signed)
Pt's daughter at bedside from about 1200 through end of shift.  Prior to daughter's arrival, pt was bathed, abd dressing change completed, sacral wound assessed, meds given, pt repositioned, room organized and cleaned, etc.  Approximately 1.5 hrs at pt's bedside.  Pt was repositioned multiple times throughout shift, but refused to turn right or middle, preferring only left side.  Educated pt on need to rotate to avoid further skin damage, but pt refused.  Pillows adjusted, HOB and FOB adjusted and multiple offers to make comfortable.  After discomfort was unrelieved, pt was offered pain meds and accepted.  Daughter later expressed her concern that pt usually does not need pain meds if she is repositioned properly.  Daughter also stated she rarely saw nurse.  Daughter was educated on the extensive care given before her arrival as well as subsequent meds and repositioning offered and provided throughout shift.  Daughter stated 2hr turns never happen. Daughter educated on pt refusal.  Also explained when staff observe daughter repositioning or providing care, there is no need to duplicate. Daughter is clearly unhappy with pt care and creates a hostile environment for staff

## 2020-12-19 NOTE — Progress Notes (Signed)
Bedside shift report accomplished. Patient and daughter, mainly daughter expressed concerns for pain management and turning during night. No further requests being made from either patient or daughter at this time. Patient is lying comfortably in bed on her L side.

## 2020-12-19 NOTE — Progress Notes (Signed)
Started pt feeding via coretrack, attempted to have [place] at 30*. Pt refuses to have HOB at 30* during nocturnal feeding. Explained aspiration risks to patient. Pt verbalized an understanding of risks and wants the [facility] lower then 30*. Pt agreed to [facility] at 22.* Will continue to monitor

## 2020-12-19 NOTE — Progress Notes (Signed)
PROGRESS NOTE  Candace [name] JEH:[id number] DOB: [birth year] DOA: 08/31/2020 PCP: Algis Greenhouse, MD  Brief History   70 year old female patient with prior history of diabetes, stage III chronic kidney disease, atrial fibrillation, hypertension, dyslipidemia and colon cancer that is post colectomy and colostomy.  Initially diagnosed with COVID on 08/23/20 she was admitted to the hospital due to dehydration, acute kidney injury and hypoxemic respiratory failure.  She was discharged home on 2 to 3 L of oxygen after being treated with IV steroids and remdesivir.  During that time although she had elevated inflammatory markers she had no embolic or thrombotic disease.  She had been on prophylactic dose heparin during the hospitalization and was placed on Eliquis for 2 weeks for after discharge  Patient returned to the ER on 08/31/2020 for complaints of not feeling well.  She was found to be in atrial fibrillation with RVR, hemorrhagic shock and work-up revealed a perforated duodenal ulcer.  She underwent exploratory laparotomy on 11/3.  She has had an extensive ICU stay with significant events as below.  11/2 Admitted with rapid Afib 11/3 OR with findings of perforated duodenal ulcer 11/10 progressive hemorrhagic shock, intubated, transfused, pressors, proned; started on CRRT in PM 11/16 Extubated. Re-intubated overnight due to respiratory distress and hypoxia with decreased mentation 11/18 Bronch, cultures sent 11/19 Hgb down getting blood 11/24 Spiked fever, resume empirical antimicrobial therapy 11/26 Hemorrhagic shock, hgb 5.6, increased pressors,  11/30 Per palliative "[name] understanding that patient is unfortunately very fragiledespite ongoing intensive medical treatment and full mechanical support. [name] that the familywantsto continue with all current interventions despite potential outcomes". 12/08 CRRT discontinued due to clotting 12/09 Family requested transfer to  tertiary care [facility] denied transfer  12/10 CRRT restarted. Episodes of tachycardia, tachypnea that seem to improve with pain management 12/11 Back in shock. Pressor requirements up. CXR worse. ABX resumed 12/12 Still requiring inc pressors. Had hypoglycemic event. Basal insulin dosing adjusted 12/13 Pressor requirements better. Now hyperglycemic. Re-adjusted Glycemic control  12/19 Afebrile . Remains on dilaudid and heparin gtt, dilaudid gtt increased overnight for concern of pain / ongoing tachycardia, no other events . NE and precedex off 12/17. Ongoing CRRT- even UF, dosing lokelmia this morning 12/20 On CRRT. Renal plans for HD cath and moving to HD. Getting wound care 12/24 - renal stopping CRRT today and plans HD 10/24/20 . 40% fio2 on vent via Trach. TAchypenic and tachycardic. Afebrile but wbc up to 27.6K. On TF. On heparin gtt 12/25 - Back on CRRT. On vent via trach at 40% fio2, On scheduled dilaudid as add on to oxy. Per family request 12/24 - they felt scheduled oxy was not adequate and patient was showing signs of opioid withdrawal.  Patient also had worsening SIRS/sepsis syndrome. Had fever, rising wbc, worsening RR 40-60 and HR 140s sinus->started On abx yesterday. Fever some better today. WBC plateau at 28,.5K. On new levophed ->signifanct improvement in HR 77 and RR t0 20. On heparin gtt. On precedex gtt. On levophed gtt 28mg wthi midodrine. On TF 12/30 Remains on CRRT with intermittent pressor requirements. Ethics consult placed 12/29. Ethics rec time trial of CRRT 12/31 failed SBT with RR 40s. Several conversations between care tam and family, who are upset RE plan of care 1/1 back on pressors  1/4: On pressors, keeping even on CVVHD, HGB drop to 6.9, transfused 1 unit. Improving mental status 1/10 remains on low dose levophed 234m 1/11 off levo, attempting HD with UF for first time. Now tolerating  intermittent HD. 1/19 patient transferred to progressive bed with  tracheostomy on 35% FiO2. has NG tube feeding. No PEG tube. Received dialysis on 1/18. 1/24 significant improvement and persistent tachycardia after introduction of twice daily beta-blocker.  Heart rates have improved from the 130s to the 98-104 range 1/24 core track clogged and therefore has been removed by nutrition team.  Calorie count in progress and we are weaning any sedating medications which could be contributing to patient's inability to eat. 1/27 continue w/ cuff #6 trach (eventually downsized to #4) 2/16 Decannulated  Consultants  . WOC . Cardiology . Psychiatry . Palliative Care . Nephrology . PCCM  Procedures  . Intubation . Transfusion . Extubation . Bronchoscopy . CRRT . Pressors . Hemodialysis . Tracheostomy . Cortrak placement . Decannulated.  Antibiotics   Anti-infectives (From admission, onward)   Start     Dose/Rate Route Frequency Ordered Stop   12/07/20 1200  vancomycin (VANCOCIN) IVPB 1000 mg/200 mL premix  Status:  Discontinued        1,000 mg 200 mL/hr over 60 Minutes Intravenous Every T-Th-Sa (Hemodialysis) 12/06/20 2156 12/07/20 1113   12/06/20 2245  vancomycin (VANCOREADY) IVPB 2000 mg/400 mL        2,000 mg 200 mL/hr over 120 Minutes Intravenous  Once 12/06/20 2156 12/07/20 0211   11/27/20 2000  ceFAZolin (ANCEF) IVPB 1 g/50 mL premix        1 g 100 mL/hr over 30 Minutes Intravenous Every 24 hours 11/26/20 1248 12/03/20 2136   11/26/20 1345  ceFAZolin (ANCEF) IVPB 1 g/50 mL premix        1 g 100 mL/hr over 30 Minutes Intravenous  Once 11/26/20 1248 11/26/20 1433   11/17/20 1415  fluconazole (DIFLUCAN) 40 MG/ML suspension 200 mg        200 mg Oral  Once 11/17/20 1315 11/17/20 1357   11/03/20 2200  meropenem (MERREM) 500 mg in sodium chloride 0.9 % 100 mL IVPB        500 mg 200 mL/hr over 30 Minutes Intravenous Every 24 hours 11/03/20 1500 11/06/20 2217   11/01/20 1000  anidulafungin (ERAXIS) 100 mg in sodium chloride 0.9 % 100 mL IVPB   Status:  Discontinued       "Followed by" Linked Group Details   100 mg 78 mL/hr over 100 Minutes Intravenous Every 24 hours 10/31/20 0916 11/01/20 1404   10/31/20 1015  meropenem (MERREM) 1 g in sodium chloride 0.9 % 100 mL IVPB  Status:  Discontinued        1 g 200 mL/hr over 30 Minutes Intravenous Every 8 hours 10/31/20 0916 11/03/20 1500   10/31/20 1015  linezolid (ZYVOX) IVPB 600 mg  Status:  Discontinued        600 mg 300 mL/hr over 60 Minutes Intravenous Every 12 hours 10/31/20 0916 11/02/20 0906   10/31/20 1015  anidulafungin (ERAXIS) 200 mg in sodium chloride 0.9 % 200 mL IVPB       "Followed by" Linked Group Details   200 mg 78 mL/hr over 200 Minutes Intravenous  Once 10/31/20 0916 10/31/20 1630   10/25/20 1800  ceFAZolin (ANCEF) IVPB 2g/100 mL premix  Status:  Discontinued        2 g 200 mL/hr over 30 Minutes Intravenous Every 12 hours 10/25/20 1022 10/30/20 1103   10/23/20 2000  vancomycin (VANCOREADY) IVPB 750 mg/150 mL  Status:  Discontinued        750 mg 150 mL/hr over 60 Minutes Intravenous Every 24  hours 10/22/20 2036 10/25/20 1022   10/23/20 1800  piperacillin-tazobactam (ZOSYN) IVPB 3.375 g  Status:  Discontinued        3.375 g 100 mL/hr over 30 Minutes Intravenous Every 6 hours 10/23/20 1155 10/24/20 1422   10/23/20 0200  piperacillin-tazobactam (ZOSYN) IVPB 3.375 g  Status:  Discontinued        3.375 g 100 mL/hr over 30 Minutes Intravenous Every 8 hours 10/22/20 2036 10/23/20 1155   10/22/20 1630  vancomycin (VANCOREADY) IVPB 1500 mg/300 mL        1,500 mg 150 mL/hr over 120 Minutes Intravenous  Once 10/22/20 1537 10/22/20 2229   10/22/20 1630  piperacillin-tazobactam (ZOSYN) IVPB 2.25 g  Status:  Discontinued        2.25 g 100 mL/hr over 30 Minutes Intravenous Every 8 hours 10/22/20 1537 10/22/20 2036   10/22/20 1537  vancomycin variable dose per unstable renal function (pharmacist dosing)  Status:  Discontinued         Does not apply See admin instructions  10/22/20 1537 10/22/20 2036   10/19/20 0600  ceFAZolin (ANCEF) IVPB 2g/100 mL premix        2 g 200 mL/hr over 30 Minutes Intravenous To Radiology 10/18/20 1457 10/19/20 0938   10/10/20 0830  vancomycin (VANCOCIN) IVPB 1000 mg/200 mL premix       "Followed by" Linked Group Details   1,000 mg 200 mL/hr over 60 Minutes Intravenous Every 24 hours 10/09/20 0744 10/15/20 0902   10/09/20 0830  piperacillin-tazobactam (ZOSYN) IVPB 3.375 g        3.375 g 100 mL/hr over 30 Minutes Intravenous Every 6 hours 10/09/20 0744 10/16/20 0038   10/09/20 0830  vancomycin (VANCOREADY) IVPB 2000 mg/400 mL       "Followed by" Linked Group Details   2,000 mg 200 mL/hr over 120 Minutes Intravenous  Once 10/09/20 0744 10/09/20 1022   09/24/20 1000  ceFAZolin (ANCEF) IVPB 2g/100 mL premix  Status:  Discontinued        2 g 200 mL/hr over 30 Minutes Intravenous Every 12 hours 09/24/20 0801 09/24/20 1046   09/23/20 1400  vancomycin (VANCOCIN) IVPB 1000 mg/200 mL premix        1,000 mg 200 mL/hr over 60 Minutes Intravenous Every 24 hours 09/22/20 1436 09/28/20 1718   09/22/20 2200  ceFEPIme (MAXIPIME) 2 g in sodium chloride 0.9 % 100 mL IVPB  Status:  Discontinued        2 g 200 mL/hr over 30 Minutes Intravenous Every 12 hours 09/22/20 1436 09/24/20 0801   09/22/20 1030  ceFEPIme (MAXIPIME) 1 g in sodium chloride 0.9 % 100 mL IVPB        1 g 200 mL/hr over 30 Minutes Intravenous  Once 09/22/20 0934 09/22/20 1145   09/22/20 1015  vancomycin (VANCOCIN) IVPB 1000 mg/200 mL premix        1,000 mg 200 mL/hr over 60 Minutes Intravenous  Once 09/22/20 0934 09/22/20 1446   09/12/20 2200  ceFEPIme (MAXIPIME) 2 g in sodium chloride 0.9 % 100 mL IVPB        2 g 200 mL/hr over 30 Minutes Intravenous Every 12 hours 09/12/20 0732 09/14/20 2134   09/11/20 1400  ceFEPIme (MAXIPIME) 2 g in sodium chloride 0.9 % 100 mL IVPB  Status:  Discontinued        2 g 200 mL/hr over 30 Minutes Intravenous Every 8 hours 09/11/20 1126  09/12/20 0732   09/08/20 1000  vancomycin (VANCOREADY) IVPB  2000 mg/400 mL        2,000 mg 200 mL/hr over 120 Minutes Intravenous  Once 09/08/20 0857 09/08/20 1224   09/08/20 1000  ceFEPIme (MAXIPIME) 2 g in sodium chloride 0.9 % 100 mL IVPB  Status:  Discontinued        2 g 200 mL/hr over 30 Minutes Intravenous Every 12 hours 09/08/20 0857 09/11/20 1126   09/08/20 0856  vancomycin variable dose per unstable renal function (pharmacist dosing)  Status:  Discontinued         Does not apply See admin instructions 09/08/20 0857 09/09/20 0935   09/02/20 1600  cefTRIAXone (ROCEPHIN) 1 g in sodium chloride 0.9 % 100 mL IVPB  Status:  Discontinued        1 g 200 mL/hr over 30 Minutes Intravenous Every 24 hours 09/01/20 1811 09/02/20 0838   09/01/20 1800  fluconazole (DIFLUCAN) IVPB 400 mg        400 mg 50 mL/hr over 240 Minutes Intravenous  Once 09/01/20 1749 09/02/20 0603   09/01/20 1530  piperacillin-tazobactam (ZOSYN) IVPB 3.375 g        3.375 g 12.5 mL/hr over 240 Minutes Intravenous Every 8 hours 09/01/20 1514 09/05/20 2111   09/01/20 1000  levofloxacin (LEVAQUIN) tablet 250 mg  Status:  Discontinued        250 mg Oral Daily 08/31/20 1508 08/31/20 1735   08/31/20 1730  cefTRIAXone (ROCEPHIN) 1 g in sodium chloride 0.9 % 100 mL IVPB  Status:  Discontinued        1 g 200 mL/hr over 30 Minutes Intravenous Every 24 hours 08/31/20 1726 09/01/20 1513   08/31/20 1730  azithromycin (ZITHROMAX) 500 mg in sodium chloride 0.9 % 250 mL IVPB  Status:  Discontinued        500 mg 250 mL/hr over 60 Minutes Intravenous Every 24 hours 08/31/20 1726 09/02/20 0838      Subjective  This patient is resting comfortably. No new complaints.  Objective   Vitals:  Vitals:   12/19/20 0456 12/19/20 0816  BP: 135/77 127/80  Pulse: (!) 104 (!) 104  Resp: 17 19  Temp: 98.7 F (37.1 C) 98.2 F (36.8 C)  SpO2: 100% 100%   Exam:  Constitutional:  . The patient is awake, alert, and oriented x 3. No acute  distress. Respiratory:  . No increased work of breathing. . No wheezes, rales, or rhonchi . No tactile fremitus Cardiovascular:  . Regular rate and rhythm . No murmurs, ectopy, or gallups. . No lateral PMI. No thrills. Abdomen:  . Abdomen is soft, non-tender, non-distended . No hernias, masses, or organomegaly . Normoactive bowel sounds.  Musculoskeletal:  . No cyanosis, clubbing, or edema Skin:  . No rashes, lesions, ulcers . palpation of skin: no induration or nodules Neurologic:  . CN 2-12 intact . Sensation all 4 extremities intact Psychiatric:  . Mental status o Mood, affect appropriate o Orientation to person, place, time  . judgment and insight appear intact  I have personally reviewed the following:   Today's Data  . Vitals, BMP, Glucoses  Micro Data  . Blood cultures x 2 1/2 positive for staph epidermidis  Imaging  . CXR  Cardiology Data  . EKG 12/15/2020  Scheduled Meds: . apixaban  2.5 mg Oral BID  . carbamide peroxide  10 drop Both EARS BID  . chlorhexidine gluconate (MEDLINE KIT)  15 mL Mouth Rinse BID  . Chlorhexidine Gluconate Cloth  6 each Topical Q0600  . collagenase  Topical BID  . darbepoetin (ARANESP) injection - NON-DIALYSIS  100 mcg Subcutaneous Q Sun-1800  . diclofenac Sodium  2 g Topical QID  . feeding supplement (NEPRO CARB STEADY)  237 mL Oral TID BM  . feeding supplement (NEPRO CARB STEADY)  780 mL Per Tube Q24H  . guaiFENesin  15 mL Per Tube Q12H   Or  . guaiFENesin  400 mg Oral Q12H  . insulin aspart  0-5 Units Subcutaneous QHS  . insulin detemir  12 Units Subcutaneous BID  . mouth rinse  15 mL Mouth Rinse QID  . megestrol  200 mg Oral QID  . metoprolol tartrate  37.5 mg Oral BID  . nutrition supplement (JUVEN)  1 packet Per Tube BID BM  . nystatin   Topical BID  . pantoprazole  40 mg Oral Daily  . sodium chloride  2 spray Each Nare Q6H  . sodium chloride flush  10-40 mL Intracatheter Q12H   Continuous Infusions: . albumin  human 25 g (11/16/20 1516)    Principal Problem:   Atrial fibrillation with RVR (HCC) Active Problems:   Acute respiratory failure due to COVID-19 (HCC)   New onset a-fib (HCC)   Leukocytosis   Atrial fibrillation with rapid ventricular response (HCC)   Hypoxia   Pressure injury of skin   Acute hypoxemic respiratory failure (HCC)   ARDS (adult respiratory distress syndrome) (HCC)   Perforated duodenal ulcer ([place])   On mechanically assisted ventilation ([place])   Palliative care by specialist   Goals of care, counseling/discussion   Shock ([place])   Acute and chronic respiratory failure (acute-on-chronic) ([place])   Status post tracheostomy ([place])   ESRD (end stage renal disease) ([place])   HAP (hospital-acquired pneumonia)   LOS: 109 days    A & P  PAF maintaining sinus rhythm w/persistent tachycardia -Continue metoprolol to 37.5 mg BID -Echocardiogram this admission with an EF 50 to 55% with mild diastolic dysfunction parameters and normal RV systolic function -Continue Eliquis-benefits coordinator has determined that Eliquis is covered at [facility] in [place] with a co-pay of $47.  Acute respiratory failure secondary to COVID-pneumonia/tracheostomy/acute MSSA tracheobronchitis -Patient stable on low-flow oxygen FiO2 21% -2/16 decannulated -Completed IV Ancef for MSSA tracheobronchitis  Right ear pain/possible impacted cerumen -Evaluated by attending physician over the weekend and was noted to have significant amount of cerumen in ear.  -Started on Debrox 10 drops 2 times daily but continues to have ear pain and now complaining of sore throat -As of 2/17 pain has improved but will add Afrin drops to further decrease eustachian tube pressure.  Acute kidney injury secondary to COVID-related sepsis with shock superimposed on stage III chronic kidney disease -Continue intermittent hemodialysis as directed by the renal team -Avoid nephrotoxic medications -Continue Aranesp on dialysis  days -Continue to follow labs and monitor urine output.  -Nephrology team remains hopeful of renal recovery.  Last HD treatment was 2/12 with removal of 1.4 L-as of today creatinine down to 0.77 with elevated BUN -Has not required dialysis since 2/12 therefore has not been given any midodrine.  Pressure stable and actually improved after administration of IV fluids.  Patient does have elevated BUN and likely is somewhat volume depleted.  She does make sincere efforts to drink plenty of fluids by mouth. -Also receiving IV fluids briefly to treat hypercalcemia -2/18 nephrologist at bedside and is hopeful that patient will not require additional hemodialysis. They will sign off if renal function continues to improve next week.  Lower extremity  muscle pain -Continue Voltaren gel.  Patient clarifies this is not a cramping intermittent pain but a constant pain.  Anxiety -Vistaril prn added by attending on 2/10-added administration instruction to give at least 30 minutes prior to dialysis treatment at request of daughter  Dysphagia/moderate to severe protein calorie malnutrition Nutrition Status: Nutrition Problem: Increased nutrient needs Etiology: wound healing Signs/Symptoms: estimated needs Interventions: Refer to RD note for recommendations  Estimated body mass index is 31.04 kg/m as calculated from the following:   Height as of this encounter: 5' 8"  (1.727 m).   Weight as of this encounter: 92.6 kg.  -Continue regular diet with nectar thick liquids -Continue nocturnal tube feedings -Continue Megace and Nepro shakes orally -SLP evaluation 2/13 with recommendation to change to intermittent supervision with meals -2/14 appropriate meds changed to crush and administer with applesauce  Acute hemorrhagic shock secondary to perforated duodenal ulcer/anemia of critical illness (initial reason for admission) -Stable postoperatively -Hemoglobin remains stable greater than 8.0  Diabetes  mellitus 2 -Patient has been transitioned to regular diet to facilitate oral intake -CBGs increasing as PO intake increase-2/15 will add Levemir 5 units BID -HgbA1c 08/24/2020 and was 7.0 -Prior to admission patient was on metformin XR-unable to use at this juncture due to severity of kidney disease -we are hopeful for recovery of renal function  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 -SNF recommended but family wants to take patient home -Patient continues to make small but impressive improvements in mobility daily -Patient now has a power wheelchair which she has been able to utilize as of last week-does require Hoyer lift to transfer-doing well with power wheelchair -Family request no SNF rehab due to pt prior request to NEVER be placed in any type of facility -2/11 continue therabands to siderails for UE resistance training -PT and OT working on an alternating schedule with one of the other therapy would see the patient daily Monday through Friday prior to hydrotherapy in wound care  Stage IV sacral decubitus -Not present on admission -Wound care nurse following-continue now has wound VAC and hydrotherapy -Surgical team consulted.  Current recommendation is against pursuing general anesthesia to undergo extensive surgical procedure-hopefully can be reevaluated in the future if wound does not heal adequately.  As of 1/28 they have nothing further acutely to add but will be following peripherally. -Continue [place] rotational bed  -Continue oxycodone for pain  -Continue pain assessment every 2 hours   Coag neg staph-1/2 blood cultures positive -dw ID pharmacist- cultures c/w contamination Vancomycin ordered overnight stopped on 2/8  Acute encephalopathy secondary to prolonged hospital stay -Resolved  History of stage IV colon cancer -Old colostomy  Acute encephalopathy with ICU delirium -Delirium has resolved and supportive meds of Klonopin and  Seroquel have been weaned and discontinued  Hypomagnesemia -Continue oral magnesium -1/21 give 4 g magnesium IV x1 -Follow labs  DVT bilateral posterior tibial veins -Was not present during initial COVID admission -Continue Eliquis for now given severe debility and increased risk for developing recurrent DVT  Hypertension -Having issues with orthostasis requiring midodrine Prior to admission patient was on Norvasc  Dyslipidemia -Prior to admission patient was on Crestor-consider resuming soon once patient can swallow pills whole  Abnormal TSH -Nov 2021 TSH was 0.015-TSH and free T4, T3 normal this admission  I have seen and examined this patient myself. I have spent 34 minutes in her evaluation and care.  DVT Prophylaxis: Eliquis CODE STATUS: Full Code Family Communication: None available today. Disposition: The patient is  from: Home  Anticipated d/c is to: Home with home health   Anticipated d/c date is: > 3 days  Patient currently is not medically stable to d/c.  Barriers to discharge: Currently stable off of dialysis but final decision to stop dialysis pending-continues to require hydrotherapy/wound VAC to treat extremely large sacral decubitus-also requiring Cortrak for nocturnal feeds  Considerations for home discharge: 1. Patient will need to continue wound VAC and current wound VAC is apparently not available in the home setting 2. It is likely she will need a specialty bed.  We are currently transitioning back to the [place] rotational bed which will allow for better in bed mobility including the ability to "stand" with PT in the bed without actually getting patient out of bed 3. Likely would need to be decannulated to minimize other care issues after discharge 4. Would need to be able to mobilize for outpatient hemodialysis 5. Would not have aggressive PT OT available in the home setting that she can achieve during  hospitalization 6. Patient will have family members that are CNAs managing her after discharge but this would be 24/7 requirement and this could be an overwhelming issue once patient is actually home  Candace Eskelson, DO [facility] Direct contact: see [email or web address]  7PM-7AM contact night coverage as above 12/19/2020, 11:35 PM  LOS: 108 days

## 2020-12-19 NOTE — Progress Notes (Signed)
Daughter questions the use of cardiac telemetry expressing her desire for her mom to be monitored just in case an event were to occur. Will reach out for cardiac tele orders based on request and active progressive care order. Patient is still on L side and has been readjusted in bed more on L side (per patients request).

## 2020-12-19 NOTE — Progress Notes (Signed)
Patient does not wish to be turned for the 2200 hour, wants to remain on L side with pillows supporting her back.

## 2020-12-19 NOTE — Progress Notes (Signed)
Kangaroo pump alarming for flow error-clog in line, pump changed. Attempted to flush core track with no success. Notified [facility] hospital[facility], A. Clearence Ped DO, advised to hold feeding tonight. Will continue to monitor

## 2020-12-19 NOTE — Progress Notes (Signed)
Message sent to MD on secure chat:  Daughter would like to speak with you regarding Aranesp. I explained what it is for and why the order was changed from IV to Subq. She is concerned the source of her mother's anemia is related to Thalassemia.

## 2020-12-20 ENCOUNTER — Inpatient Hospital Stay (HOSPITAL_COMMUNITY): Payer: Medicare Other

## 2020-12-20 DIAGNOSIS — I4891 Unspecified atrial fibrillation: Secondary | ICD-10-CM | POA: Diagnosis not present

## 2020-12-20 HISTORY — PX: IR REMOVAL TUN CV CATH W/O FL: IMG2289

## 2020-12-20 LAB — RENAL FUNCTION PANEL
Albumin: 2.3 g/dL — ABNORMAL LOW (ref 3.5–5.0)
Anion gap: 6 (ref 5–15)
BUN: 44 mg/dL — ABNORMAL HIGH (ref 8–23)
CO2: 20 mmol/L — ABNORMAL LOW (ref 22–32)
Calcium: 11.4 mg/dL — ABNORMAL HIGH (ref 8.9–10.3)
Chloride: 110 mmol/L (ref 98–111)
Creatinine, Ser: 0.7 mg/dL (ref 0.44–1.00)
GFR, Estimated: >60 mL/min (ref >60)
Glucose, Bld: 161 mg/dL — ABNORMAL HIGH (ref 70–99)
Phosphorus: 3.1 mg/dL (ref 2.5–4.6)
Potassium: 5.1 mmol/L (ref 3.5–5.1)
Sodium: 136 mmol/L (ref 135–145)

## 2020-12-20 LAB — GLUCOSE, CAPILLARY
Glucose-Capillary: 133 mg/dL — ABNORMAL HIGH (ref 70–99)
Glucose-Capillary: 137 mg/dL — ABNORMAL HIGH (ref 70–99)
Glucose-Capillary: 139 mg/dL — ABNORMAL HIGH (ref 70–99)
Glucose-Capillary: 141 mg/dL — ABNORMAL HIGH (ref 70–99)
Glucose-Capillary: 169 mg/dL — ABNORMAL HIGH (ref 70–99)
Glucose-Capillary: 189 mg/dL — ABNORMAL HIGH (ref 70–99)
Glucose-Capillary: 240 mg/dL — ABNORMAL HIGH (ref 70–99)

## 2020-12-20 MED ORDER — SODIUM CHLORIDE 0.9 % IV SOLN: INTRAVENOUS | Status: AC

## 2020-12-20 MED ORDER — ENSURE ENLIVE PO LIQD
237.0000 mL | Freq: Three times a day (TID) | ORAL | Status: DC
  Administered 2020-12-20 – 2020-12-23 (×8): 237 mL via ORAL

## 2020-12-20 MED ORDER — LIDOCAINE HCL 1 % IJ SOLN
INTRAMUSCULAR | Status: AC
  Filled 2020-12-20: qty 20

## 2020-12-20 NOTE — Progress Notes (Signed)
Patient wants to remain on back for 0400 hour.

## 2020-12-20 NOTE — Progress Notes (Signed)
Physical Therapy Wound Treatment Patient Details  Name: Candace [name] MRN: [id number] Date of Birth: 1951/10/03  Today's Date: 12/20/2020 Time: 7616-0737 Time Calculation (min): 59 min  Subjective  Subjective: Pt and pt daughter pleased with how wound has progressed Patient and Family Stated Goals: heal wound Prior Treatments: prior hydro and Dakins  Pain Score: 2/10  Wound Assessment  Pressure Injury 09/25/20 Sacrum Bilateral;Medial Deep Tissue Pressure Injury - Purple or maroon localized area of discolored intact skin or blood-filled blister due to damage of underlying soft tissue from pressure and/or shear. Purple, non-blanchable, b (Active)  Wound Image   12/20/20 1658  Dressing Type Barrier Film (skin prep);Negative pressure wound therapy 12/20/20 1658  Dressing Changed 12/20/20 1658  Dressing Change Frequency Daily 12/20/20 1658  State of Healing Early/partial granulation 12/20/20 1658  Site / Wound Assessment Yellow;Pink;Red;Granulation tissue 12/20/20 1658  % Wound base Red or Granulating 65% 12/17/20 1359  % Wound base Yellow/Fibrinous Exudate 25% 12/17/20 1359  % Wound base Black/Eschar 0% 12/17/20 1359  % Wound base Other/Granulation Tissue (Comment) 10% 12/17/20 1359  Peri-wound Assessment Intact 12/17/20 1359  Wound Length (cm) 14 cm 12/01/20 1722  Wound Width (cm) 12 cm 12/01/20 1722  Wound Depth (cm) 6 cm 12/01/20 1722  Wound Surface Area (cm^2) 168 cm^2 12/01/20 1722  Wound Volume (cm^3) 1008 cm^3 12/01/20 1722  Tunneling (cm) 0 12/01/20 1722  Undermining (cm) 0 12/01/20 1722  Margins Unattached edges (unapproximated) 12/20/20 1658  Drainage Amount Minimal 12/20/20 1658  Drainage Description Serous 12/20/20 1658  Treatment Debridement (Selective);Negative pressure wound therapy 12/20/20 1658     Selective Debridement Selective Debridement - Location: sacrum Selective Debridement - Tools Used: Forceps;Scalpel Selective Debridement - Tissue Removed: yellow  unviable tissue   Wound Assessment and Plan  Wound Therapy - Assess/Plan/Recommendations Wound Therapy - Clinical Statement: Wound bed continues to improve in appearance. Up to 65% granulation tissue present with some fascia/connective tissue present at the base of the wound. Will plan to convert to regular wound VAC Wednesday, 12/22/2020. Will continue with current plan of care. Wound Therapy - Functional Problem List: Global weakness and immobility Factors Delaying/Impairing Wound Healing: Diabetes Mellitus;Immobility;Multiple medical problems;Other (comment) Hydrotherapy Plan: Debridement;Patient/family education Wound Therapy - Frequency: 3X / week  Wound Therapy Goals- Improve the function of patient's integumentary system by progressing the wound(s) through the phases of wound healing (inflammation - proliferation - remodeling) by: Decrease Necrotic Tissue to: 20 Decrease Necrotic Tissue - Progress: Progressing toward goal Increase Granulation Tissue to: 80 Increase Granulation Tissue - Progress: Progressing toward goal Goals/treatment plan/discharge plan were made with and agreed upon by patient/family: Yes Time For Goal Achievement: 7 days Wound Therapy - Potential for Goals: Good  Goals will be updated until maximal potential achieved or discharge criteria met.  Discharge criteria: when goals achieved, discharge from hospital, MD decision/surgical intervention, no progress towards goals, refusal/missing three consecutive treatments without notification or medical reason.  GP    [name], PT, DPT Acute Rehabilitation Services Pager [phone] Office [phone]   [name] 12/20/2020, 5:09 PM

## 2020-12-20 NOTE — Progress Notes (Signed)
0200 round, patient has agreed to turn. Pillows removed and patient is resting on back.

## 2020-12-20 NOTE — TOC Progression Note (Signed)
Transition of Care [facility]) - Progression Note    Patient Details  Name: Jenefer Woerner MRN: [id number] Date of Birth: September 11, 1951  Transition of [facility]) CM/SW [place], RN Phone Number: 12/20/2020, 11:56 AM  Clinical Narrative:    Case management met with the patient and daughter at the bedside regarding transitions of care to home with home health services and needed dme.  I spoke with the daughter and she is pleased with home health services through [facility] and dme through [place].  I called and left a message with Tommi Rumps, CM with [facility] that patient is being discharge home with family this week and will be in need of home health services - including RN, aide, MSW, PT and OT services.  The patient will be discharged home with [facility] wound vac and will need RN visit 3 x per week for wound care per physcian discharge orders.  Will follow up with [facility].  Home health orders were placed in Epic to be co-signed by [name], NP.  I called and spoke with [name], CM at [facility] and ordered a specialty bed for home including a hoyer lift and trapeze bar.  She confirmed the order and will follow up with the daughter, [name], concerning delivery of the bed.  DME orders were placed for specialty bed, air mattress along with hoyer lift and trapeze bar.  Romilda Garret, CM with [facility] Specialty wheelchair was called and message was left with [employer] office to let them know that the patient will be discharging home this week with family.  The patient currently has an Armed forces operational officer from the facility and has a specialty wheelchair on order.  [name], CM with [facility] was called regarding need for home dme [employer] wound vac.   The dme paperwork paperwork for [facility] wound vac was signed by [name], NP and the order was then sent to [facility] by fax and secure email.  [name], CM with [facility] confirmed that the order was received.  CM and MSW will continue to follow the patient for transitions of  care needs for home health and dme.     Expected Discharge Plan: [place] Barriers to Discharge: Continued Medical Work up  Expected Discharge Plan and Services Expected Discharge Plan: [place] In-house Referral: Clinical Social Work Discharge Planning Services: CM Consult Post Acute Care Choice: Durable Medical Equipment,Home Health,Dialysis Living arrangements for the past 2 months: Single Family Home                 DME Arranged: N/A DME Agency: NA       HH Arranged: NA HH Agency: NA         Social Determinants of Health (SDOH) Interventions    Readmission Risk Interventions Readmission Risk Prevention Plan 11/19/2020  Transportation Screening Complete  Medication Review Press photographer) Complete  PCP or Specialist appointment within 3-5 days of discharge Complete  HRI or Home Care Consult Complete  SW Recovery Care/Counseling Consult Complete  Palliative Care Screening Complete  Skilled Nursing Facility Complete  Some recent data might be hidden

## 2020-12-20 NOTE — Progress Notes (Signed)
Physical Therapy Treatment Patient Details Name: Candace [name] MRN: [id number] DOB: 1951-07-14 Today's Date: 12/20/2020    History of Present Illness Pt 70 y.o. female with medical history significant for recent covid pna, obesity, colon cancer s/p colon resection with colostomy bag, HLD, NIDDM2, CKD3 presented to ED after her follow-up nurse advised her to present to ED for elevated HR. +afib, elevated troponins with demand ischemia,  CT scan is positive for bowel perforation with pneumomediastinum 11/03 exp lap with repair of perforated ulcer. 11/10 t/f to ICU- intubated/sedated/proned. Trach placed 11/24. CRRT off 12/8, restarted 12/10-1/5. 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  Decannulated 12/15/20    PT Comments    Pt had a limited session today due to sacral pain and misunderstanding the need for pain meds prior to her scheduled therapy (pt reports she did not know the "plan", so PT posted a weekly therapy/hydro schedule on her board).  So, RN medicated pt during therapy session.  Very limited tolerance of being up on her bottom.  The focus of our session today was continued progression of independence with WC use and driving the WC.  Pt went down hallway and around the RN station, and once in the hallway with supervision.  Verbal cues still needed for functionalility of buttons, and for tighter space navigation in her room, but hallway navigation was much improved.    Follow Up Recommendations  Home health PT;Supervision/Assistance - 24 hour     Equipment Recommendations  Wheelchair (measurements PT);Wheelchair cushion (measurements PT);Hospital bed;Other (comment) (custom WC, hospital bed, air mattress, hoyer lift, WC ramp, ambulance transport home, HHPT, OT, RN)    Recommendations for Other Services OT consult     Precautions / Restrictions Precautions Precautions: Fall Precaution Comments:  baseline R colostomy; sacral wound with wound vac, coretrack (supposed to d/c that 12/20/20) Other Brace: B prafos, prvalons    Mobility  Bed Mobility Overal bed mobility: Needs Assistance Bed Mobility: Rolling Rolling: Max assist         General bed mobility comments: Max assist to roll for lift pad placement, pt attempting to help by actively assisting with knee flexion and cross body reaching.    Transfers Overall transfer level: Needs assistance               General transfer comment: transferred OOB to [facility] with maxi move.  Ambulation/Gait                 Theme park manager mobility: Yes Wheelchair propulsion: Left upper extremity Wheelchair parts: Needs assistance Distance: 300 Wheelchair Assistance Details (indicate cue type and reason): Pt needed minimal cues for buttons and tight obstacle navigation in room, but once in the hallway was fully supervision for power chair steering.  PT also assisted in backing up into room for positioning for lift back to bed (pt cannot turn to see behind her).  Modified Rankin (Stroke Patients Only)       Balance                                            Cognition Arousal/Alertness: Awake/alert Behavior During Therapy: WFL for tasks assessed/performed Overall Cognitive Status: Impaired/Different from baseline Area of Impairment: Memory  Memory: Decreased short-term memory         General Comments: Pt reports RN had not been in, RN notes and speaking with RN she had been in, pt reports she did not refuse her AM meds, and did not know the "plan" for the day, so refused pain meds when offered earlier.  PT posted a schedule for the week on the wall for pt to reference, and reinforced that today was Monday.      Exercises      General Comments        Pertinent Vitals/Pain Pain Assessment: Faces Faces Pain  Scale: Hurts whole lot Pain Location: bottom Pain Descriptors / Indicators: Guarding;Grimacing Pain Intervention(s): Limited activity within patient's tolerance;Monitored during session;Repositioned;RN gave pain meds during session    Home Living                      Prior Function            PT Goals (current goals can now be found in the care plan section) Acute Rehab PT Goals Patient Stated Goal: daughter would like her OOB Progress towards PT goals: Progressing toward goals    Frequency    Min 3X/week (OT PT to cover the spread of 5 days per week)      PT Plan Current plan remains appropriate    Co-evaluation              AM-PAC PT "6 Clicks" Mobility   Outcome Measure  Help needed turning from your back to your side while in a flat bed without using bedrails?: A Lot Help needed moving from lying on your back to sitting on the side of a flat bed without using bedrails?: Total Help needed moving to and from a bed to a chair (including a wheelchair)?: Total Help needed standing up from a chair using your arms (e.g., wheelchair or bedside chair)?: Total Help needed to walk in hospital room?: Total Help needed climbing 3-5 steps with a railing? : Total 6 Click Score: 7    End of Session   Activity Tolerance: Patient limited by pain Patient left: in bed;with call bell/phone within reach;with family/visitor present Nurse Communication: Mobility status;Need for lift equipment (plan for the day) PT Visit Diagnosis: Muscle weakness (generalized) (M62.81);Difficulty in walking, not elsewhere classified (R26.2);Pain;Adult, failure to thrive (R62.7);Unsteadiness on feet (R26.81) Pain - Right/Left: Right Pain - part of body: Knee     Time: 0932-3557 PT Time Calculation (min) (ACUTE ONLY): 60 min  Charges:  $Therapeutic Activity: 53-67 mins                     Verdene Lennert, PT, DPT  Acute Rehabilitation [phone] pager [phone]) [phone]  office

## 2020-12-20 NOTE — Progress Notes (Addendum)
[facility] PROGRESS NOTE  ADDENDUM: Attestation signed by Candace Kirks, DO at 12/20/2020 1:28 PM   I have seen this patient myself. She was working with PT and was sitting up in a chair. No new complaints. I have reviewed Candace [name]' note. I am in agreement with her evaluation and plan. Plan is now for the patient to discharge to home with [facility] PT. Tunnelled HD catheter is being removed today. Nephrology has signed off.   Candace [name] DDU:[id number] DOB: 1950/12/27 DOA: 08/31/2020 PCP: Candace Greenhouse, MD           Status: Remains inpatient appropriate because:Ongoing diagnostic testing needed not appropriate for outpatient work up, Unsafe d/c plan, IV treatments appropriate due to intensity of illness or inability to take PO and Inpatient level of care appropriate due to severity of illness   Dispo: The patient is from: Home              Anticipated d/c is to: Home with home health -anticipate later this week versus early next week pending clearance from nephrology and ability to obtain appropriate DME and services prior to discharge.              Anticipated d/c date is: > 3 days              Patient currently is not medically stable to d/c.  Barriers to discharge: Currently stable off of dialysis nephrologist as of 2/21 has ordered removal of tunneled dialysis catheter noting no indication to continue dialysis at this juncture but recommendation to continue renal diet at home.  Eported that no longer requiring hydrotherapy since wound bed clean but will need to discharge with [facility], hospital bed, specialty mattress/air mattress.  Family states transport Lucianne Lei already in place and paid for by [employer].   Code Status: Full Family Communication: 2/21 daughter Candace [name] at bedside DVT prophylaxis: Eliquis Vaccination status: Has not been vaccinated but did have severe COVID infection initially diagnosed on 08/23/2020.  Patient will be eligible for COVID-vaccine after  11/23/2020   Foley catheter: No, purewick female urinary collection device   HPI: 70 year old female patient with prior history of diabetes, stage III chronic kidney disease, atrial fibrillation, hypertension, dyslipidemia and colon cancer that is post colectomy and colostomy.  Initially diagnosed with COVID on 08/23/20 she was admitted to the hospital due to dehydration, acute kidney injury and hypoxemic respiratory failure.  She was discharged home on 2 to 3 L of oxygen after being treated with IV steroids and remdesivir.  During that time although she had elevated inflammatory markers she had no embolic or thrombotic disease.  She had been on prophylactic dose heparin during the hospitalization and was placed on Eliquis for 2 weeks for after discharge  Patient returned to the ER on 08/31/2020 for complaints of not feeling well.  She was found to be in atrial fibrillation with RVR, hemorrhagic shock and work-up revealed a perforated duodenal ulcer.  She underwent exploratory laparotomy on 11/3.  She has had an extensive ICU stay with significant events as below.   11/2 Admitted with rapid Afib 11/3 OR with findings of perforated duodenal ulcer 11/10 progressive hemorrhagic shock, intubated, transfused, pressors, proned; started on CRRT in PM 11/16 Extubated. Re-intubated overnight due to respiratory distress and hypoxia with decreased mentation 11/18 Bronch, cultures sent 11/19 Hgb down getting blood 11/24 Spiked fever, resume empirical antimicrobial therapy 11/26 Hemorrhagic shock, hgb 5.6, increased pressors,  11/30 Per palliative "[name] understanding that patient is  unfortunately very fragiledespite ongoing intensive medical treatment and full mechanical support. [name] that the familywantsto continue with all current interventions despite potential outcomes". 12/08 CRRT discontinued due to clotting 12/09 Family requested transfer to tertiary care [facility] denied  transfer  12/10 CRRT restarted. Episodes of tachycardia, tachypnea that seem to improve with pain management 12/11 Back in shock. Pressor requirements up. CXR worse. ABX resumed 12/12 Still requiring inc pressors. Had hypoglycemic event. Basal insulin dosing adjusted 12/13 Pressor requirements better. Now hyperglycemic. Re-adjusted Glycemic control  12/19 Afebrile . Remains on dilaudid and heparin gtt, dilaudid gtt increased overnight for concern of pain / ongoing tachycardia, no other events . NE and precedex off 12/17. Ongoing CRRT- even UF, dosing lokelmia this morning 12/20 On CRRT. Renal plans for HD cath and moving to HD. Getting wound care 12/24 - renal stopping CRRT today and plans HD 10/24/20 . 40% fio2 on vent via Trach. TAchypenic and tachycardic. Afebrile but wbc up to 27.6K. On TF. On heparin gtt 12/25 - Back on CRRT. On vent via trach at 40% fio2, On scheduled dilaudid as add on to oxy. Per family request 12/24 - they felt scheduled oxy was not adequate and patient was showing signs of opioid withdrawal.  Patient also had worsening SIRS/sepsis syndrome. Had fever, rising wbc, worsening RR 40-60 and HR 140s sinus->started On abx yesterday. Fever some better today. WBC plateau at 28,.5K. On new levophed ->signifanct improvement in HR 77 and RR t0 20. On heparin gtt. On precedex gtt. On levophed gtt 57mg wthi midodrine. On TF 12/30 Remains on CRRT with intermittent pressor requirements. Ethics consult placed 12/29. Ethics rec time trial of CRRT 12/31 failed SBT with RR 40s. Several conversations between care tam and family, who are upset RE plan of care 1/1 back on pressors  1/4: On pressors, keeping even on CVVHD, HGB drop to 6.9, transfused 1 unit. Improving mental status 1/10 remains on low dose levophed 26m 1/11 off levo, attempting HD with UF for first time. Now tolerating intermittent HD. 1/19 patient transferred to progressive bed with tracheostomy on 35% FiO2.  has NG tube  feeding.  No PEG tube.  Received dialysis on 1/18. 1/24 significant improvement and persistent tachycardia after introduction of twice daily beta-blocker.  Heart rates have improved from the 130s to the 98-104 range 1/24 core track clogged and therefore has been removed by nutrition team.  Calorie count in progress and we are weaning any sedating medications which could be contributing to patient's inability to eat. 1/27 continue w/ cuff #6 trach (eventually downsized to #4) 2/16 Decannulated 2/21 order placed to remove tunneled dialysis catheter  Subjective: Awake alert and smiling noting patient has been informed that she will likely discharge later in the week.  She has recently been placed back to bed after utilizing her motorized wheelchair on the unit.  Daughter at bedside.   Objective: Vitals:   12/20/20 0007 12/20/20 0523  BP:  123/80  Pulse:  97  Resp:    Temp: 98.5 F (36.9 C) 98.1 F (36.7 C)  SpO2: 99%     Intake/Output Summary (Last 24 hours) at 12/20/2020 0734 Last data filed at 12/20/2020 0200 Gross per 24 hour  Intake 540 ml  Output 1960 ml  Net -1420 ml   Filed Weights    Exam: Constitutional: Awake alert and in no acute distress Respiratory: Lungs are clear, stable on room air.  Old tracheostomy site unremarkable Cardiovascular: S1-S2 without rubs murmurs thrills or gallops.  Pulse  regular with only intermittent nonsustained tachycardia Abdomen: Soft nontender and bowel sounds are present.  LBM 2/20-cortrack has been clogged and they have been unsuccessful at opening back up. Skin:  Massive sacral decubitus ulcer-wound VAC with serosanguineous drainage noted in collection system Neurologic: CN 2-12 grossly intact. Sensation intact, DTR normal. Strength 1/5 x all 4 extremities.  Psychiatric: Awake, alert and oriented x3 with pleasant affect.   Assessment/Plan: Acute problems: PAF maintaining sinus rhythm w/persistent tachycardia -Continue metoprolol to  37.5 mg BID -Echocardiogram this admission with an EF 50 to 55% with mild diastolic dysfunction parameters and normal RV systolic function -Continue Eliquis-benefits coordinator has determined that Eliquis is covered at [facility] in [place] with a co-pay of $47  Acute respiratory failure secondary to COVID-pneumonia/tracheostomy/acute MSSA tracheobronchitis -Patient stable on low-flow oxygen FiO2 21% -2/16 decannulated -Completed IV Ancef for MSSA tracheobronchitis  Right ear pain/possible impacted cerumen -Evaluated by attending physician over the weekend and was noted to have significant amount of cerumen in ear.  -Started on Debrox 10 drops 2 times daily but continues to have ear pain and now complaining of sore throat -As of 2/17 pain has improved but will add Afrin drops to further decrease eustachian tube pressure.  Acute kidney injury secondary to COVID-related sepsis with shock superimposed on stage III chronic kidney disease -Continue intermittent hemodialysis as directed by the renal team -Avoid nephrotoxic medications -Continue Aranesp on dialysis days -Continue to follow labs and monitor urine output.  -Nephrology team remains hopeful of renal recovery.  Last HD treatment was 2/12 with removal of 1.4 L-as of today creatinine down to 0.77 with elevated BUN -Has not required dialysis since 2/12 therefore has not been given any midodrine.  Pressure stable and actually improved after administration of IV fluids.  Patient does have elevated BUN and likely is somewhat volume depleted.  She does make sincere efforts to drink plenty of fluids by mouth. -2/21 nephrology has reordered saline to treat hypercalcemia -2/21 no longer needs hemodialysis in order placed by nephrology team to removed tunneled dialysis catheter -Despite normal renal function which is influenced by low muscle mass nephrology recommends continuing a renal-based diet given low-grade hyperkalemia and ongoing  hypercalcemia  Lower extremity muscle pain -Continue Voltaren gel.  Patient clarifies this is not a cramping intermittent pain but a constant pain.  Anxiety -Vistaril prn added by attending on 2/10-added administration instruction to give at least 30 minutes prior to dialysis treatment at request of daughter  Dysphagia/moderate to severe protein calorie malnutrition Nutrition Status: Nutrition Problem: Increased nutrient needs Etiology: wound healing Signs/Symptoms: estimated needs Interventions: Refer to RD note for recommendations  Estimated body mass index is 31.04 kg/m as calculated from the following:   Height as of this encounter: 5' 8"  (1.727 m).   Weight as of this encounter: 92.6 kg.  -Continue regular diet with nectar thick liquids-nectar thick liquids are likely contributing to ongoing dehydration and poor intake of fluids -Continue Megace -I have changed from Nepro to Ensure Enlive but since patient is to remain on renal diet may need to consider changing this back to Nepro -SLP evaluation 2/13 with recommendation to change to intermittent supervision with meals -Unable to unclog core track tube.  Discussed at length with daughter and given that patient otherwise will be ready for discharge home later in the week plan is to discontinue core track tube and allow for oral feedings only with supplemental protein shakes. -Juven and Prosource discontinued since not intended to be taken orally-patient encouraged  to drink oral protein shake  Acute hemorrhagic shock secondary to perforated duodenal ulcer/anemia of critical illness (initial reason for admission) -Stable postoperatively -Hemoglobin remains stable greater than 8.0  Diabetes mellitus 2 -Patient has been transitioned to regular diet to facilitate oral intake -CBGs increasing as PO intake increase-continue Levemir 5 units BID -HgbA1c 08/24/2020 and was 7.0 -Prior to admission patient was on metformin XR-unable to use at  this juncture due to severity of kidney disease -we are hopeful for recovery of renal function  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 -SNF recommended but family wants to take patient home -Patient continues to make small but impressive improvements in mobility daily -Patient now has a power wheelchair which she has been able to utilize as of last week-does require Hoyer lift to transfer-doing well with power wheelchair -Family request no SNF rehab due to pt prior request to NEVER be placed in any type of facility -2/11 continue therabands to siderails for UE resistance training -PT and OT working on an alternating schedule with one of the other therapy would see the patient daily Monday through Friday prior to hydrotherapy and wound care it appears as of 2/21 no further indications to continue hydrotherapy  Stage IV sacral decubitus -Not present on admission -Wound care nurse following-continue now has wound VAC -wound has improved significantly and apparently PT is now recommending discontinuation of hydrotherapy -Surgical team consulted.  Current recommendation is against pursuing general anesthesia to undergo extensive surgical procedure-hopefully can be reevaluated in the future if wound does not heal adequately.  As of 1/28 they have nothing further acutely to add but will be following peripherally.  Patient will need to follow-up with the surgical team for outpatient wound management.  Plan is to continue [facility] after discharge. -Continue Kreg rotational bed while inpatient but will transition to hospital bed with mattress overlay/air mattress at discharge -Continue specialty motorized wheelchair -Continue oxycodone for pain  -Continue pain assessment every 2 hours while in the hospital  -  11/24/2020 after [facility]      11/22/2020                       11/29/2020     12/06/2020                             12/13/2020   Incision (Closed) 09/01/20 Abdomen (Active)   Date First Assessed/Time First Assessed: 09/01/20 1737   Location: Abdomen    Assessments 09/01/2020  6:25 PM 12/20/2020  8:14 AM  Dressing Type Gauze (Comment) Abdominal pads  Dressing Change Frequency - Daily  Site / Wound Assessment Dressing in place / Unable to assess Dressing in place / Unable to assess  Drainage Amount None Scant     No Linked orders to display     Pressure Injury 09/17/20 Ear Left;Anterior;Posterior Stage 2 -  Partial thickness loss of dermis presenting as a shallow open injury with a red, pink wound bed without slough. (Active)  Date First Assessed/Time First Assessed: 09/17/20 0800   Location: Ear  Location Orientation: Left;Anterior;Posterior  Staging: Stage 2 -  Partial thickness loss of dermis presenting as a shallow open injury with a red, pink wound bed without slough. ...    Assessments 09/17/2020  8:00 AM 12/05/2020  8:00 PM  Dressing Type Foam - Lift dressing to assess site every shift None  Dressing Clean;Dry;Intact -  Dressing Change Frequency Every 3 days -  Site / Wound Assessment Dry;Pink -  Peri-wound Assessment Intact -  Wound Length (cm) 2 cm -  Wound Width (cm) 1 cm -  Wound Depth (cm) 0.25 cm -  Wound Surface Area (cm^2) 2 cm^2 -  Wound Volume (cm^3) 0.5 cm^3 -  Margins Unattached edges (unapproximated) -  Drainage Amount Scant -  Drainage Description Serosanguineous -  Treatment Cleansed -     No Linked orders to display     Pressure Injury 09/25/20 Sacrum Bilateral;Medial Deep Tissue Pressure Injury - Purple or maroon localized area of discolored intact skin or blood-filled blister due to damage of underlying soft tissue from pressure and/or shear. Purple, non-blanchable, b (Active)  Date First Assessed/Time First Assessed: 09/25/20 2000   Location: Sacrum  Location Orientation: Bilateral;Medial  Staging: Deep Tissue Pressure Injury - Purple or maroon localized area of discolored intact skin or blood-filled blister due to damage o...     Assessments 09/25/2020  5:00 PM 12/20/2020  8:14 AM  Dressing Type Foam - Lift dressing to assess site every shift Negative pressure wound therapy  Dressing Changed;Clean;Dry;Intact Clean;Dry;Intact  Site / Wound Assessment - Dressing in place / Unable to assess  Drainage Amount - Minimal  Drainage Description - Serous     No Linked orders to display     Negative Pressure Wound Therapy Sacrum (Active)  Placement Date/Time: 11/22/20 1500   Wound Type: (c) Other (Comment)  Location: Sacrum    Assessments 11/22/2020  4:59 PM 12/20/2020  8:14 AM  Last dressing change 11/22/20 12/17/20  Site / Wound Assessment Granulation tissue;Pale;Yellow Clean;Dry  Peri-wound Assessment Intact Intact  Size see above -  Wound filler - Black foam 2 -  Wound filler - White foam 0 -  Wound filler - Nonadherent 0 -  Wound filler - Gauze 0 -  Cycle Continuous Continuous  Target Pressure (mmHg) 125 125  Instillation Volume 26 mL -  Instillation Solution Normal Saline -  Instillation Soak Time 10 minutes -  Instillation Therapy Time 3.5 hours -  Canister Changed No -  Dressing Status Intact -  Drainage Amount None -     No Linked orders to display     Other problems: Coag neg staph-1/2 blood cultures positive -dw ID pharmacist- cultures c/w contamination Vancomycin ordered overnight stopped on 2/8  Acute encephalopathy secondary to prolonged hospital stay -Resolved  History of stage IV colon cancer -Old colostomy  Acute encephalopathy with ICU delirium -Delirium has resolved and supportive meds of Klonopin and Seroquel have been weaned and discontinued  Hypomagnesemia -Continue oral magnesium -1/21 give 4 g magnesium IV x1 -Follow labs  DVT bilateral posterior tibial veins -Was not present during initial COVID admission -Continue Eliquis for now given severe debility and increased risk for developing recurrent DVT  Hypertension -Having issues with orthostasis requiring midodrine Prior  to admission patient was on Norvasc  Dyslipidemia -Prior to admission patient was on Crestor-consider resuming soon once patient can swallow pills whole  Abnormal TSH -Nov 2021 TSH was 0.015-TSH and free T4, T3 normal this admission  Data Reviewed: Basic Metabolic Panel: Recent Labs  Lab 12/16/20 0147 12/17/20 0336 12/18/20 0144 12/19/20 0957 12/20/20 0225  NA 133* 137 136 134* 136  K 4.5 4.3 4.9 4.7 5.1  CL 104 108 109 108 110  CO2 19* 20* 20* 19* 20*  GLUCOSE 263* 256* 254* 199* 161*  BUN 71* 60* 51* 40* 44*  CREATININE 0.83 0.70 0.65 0.61 0.70  CALCIUM 11.3* 11.0* 11.2* 11.4* 11.4*  PHOS 3.1 2.4* 2.7 3.2 3.1   Liver Function Tests: Recent Labs  Lab 12/16/20 0147 12/17/20 0336 12/18/20 0144 12/19/20 0957 12/20/20 0225  ALBUMIN 2.4* 2.4* 2.3* 2.4* 2.3*   No results for input(s): LIPASE, AMYLASE in the last 168 hours. No results for input(s): AMMONIA in the last 168 hours. CBC: Recent Labs  Lab 12/16/20 0147  WBC 13.4*  HGB 9.2*  HCT 30.9*  MCV 87.5  PLT 305   Cardiac Enzymes: No results for input(s): CKTOTAL, CKMB, CKMBINDEX, TROPONINI in the last 168 hours. BNP (last 3 results) Recent Labs    09/07/20 0118 09/08/20 0446 09/09/20 0428  BNP 216.8* 432.5* 609.7*    ProBNP (last 3 results) No results for input(s): PROBNP in the last 8760 hours.  CBG: Recent Labs  Lab 12/19/20 1146 12/19/20 1557 12/19/20 2025 12/20/20 0005 12/20/20 0520  GLUCAP 213* 155* 242* 189* 133*    No results found for this or any previous visit (from the past 240 hour(s)).   Studies: No results found.  Scheduled Meds: . apixaban  2.5 mg Oral BID  . carbamide peroxide  10 drop Both EARS BID  . chlorhexidine gluconate (MEDLINE KIT)  15 mL Mouth Rinse BID  . Chlorhexidine Gluconate Cloth  6 each Topical Q0600  . collagenase   Topical BID  . darbepoetin (ARANESP) injection - NON-DIALYSIS  100 mcg Subcutaneous Q [place]-1800  . diclofenac Sodium  2 g Topical QID  .  feeding supplement (NEPRO CARB STEADY)  237 mL Oral TID BM  . feeding supplement (NEPRO CARB STEADY)  780 mL Per Tube Q24H  . guaiFENesin  15 mL Per Tube Q12H   Or  . guaiFENesin  400 mg Oral Q12H  . insulin aspart  0-5 Units Subcutaneous QHS  . insulin detemir  12 Units Subcutaneous BID  . mouth rinse  15 mL Mouth Rinse QID  . megestrol  200 mg Oral QID  . metoprolol tartrate  37.5 mg Oral BID  . nutrition supplement (JUVEN)  1 packet Per Tube BID BM  . nystatin   Topical BID  . pantoprazole  40 mg Oral Daily  . sodium chloride  2 spray Each Nare Q6H  . sodium chloride flush  10-40 mL Intracatheter Q12H   Continuous Infusions: . albumin human 25 g (11/16/20 1516)    Principal Problem:   Atrial fibrillation with RVR (HCC) Active Problems:   Acute respiratory failure due to COVID-19 [facility])   New onset a-fib (HCC)   Leukocytosis   Atrial fibrillation with rapid ventricular response (HCC)   Hypoxia   Pressure injury of skin   Acute hypoxemic respiratory failure (HCC)   ARDS (adult respiratory distress syndrome) (HCC)   Perforated duodenal ulcer ([place])   On mechanically assisted ventilation ([place])   Palliative care by specialist   Goals of care, counseling/discussion   Shock ([place])   Acute and chronic respiratory failure (acute-on-chronic) ([place])   Status post tracheostomy ([place])   ESRD (end stage renal disease) ([place])   HAP (hospital-acquired pneumonia)   Consultants:  Cardiology  Surgery  Nephrology  Ethics  PCCM   Procedures: R PICC 11/5 >> A line 11/9 >> out ETT 11/9 > 11/16, 11/16 >> 09/21/2020, 09/21/2020 tracheostomy>> Lt [place] CVL 11/9 >> R IJ trialysis >> out HD catheter 12/1 >>12/20 12/21 - 14.5 Fr, 23 cm right IJ tunneled hemodialysis catheter placement. Removal of indwelling subclavian catheter.   Antibiotics: Anti-infectives (From admission, onward)   Start     Dose/Rate  Route Frequency Ordered Stop   12/07/20 1200  vancomycin (VANCOCIN) IVPB 1000  mg/200 mL premix  Status:  Discontinued        1,000 mg 200 mL/hr over 60 Minutes Intravenous Every T-Th-Sa (Hemodialysis) 12/06/20 2156 12/07/20 1113   12/06/20 2245  vancomycin (VANCOREADY) IVPB 2000 mg/400 mL        2,000 mg 200 mL/hr over 120 Minutes Intravenous  Once 12/06/20 2156 12/07/20 0211   11/27/20 2000  ceFAZolin (ANCEF) IVPB 1 g/50 mL premix        1 g 100 mL/hr over 30 Minutes Intravenous Every 24 hours 11/26/20 1248 12/03/20 2136   11/26/20 1345  ceFAZolin (ANCEF) IVPB 1 g/50 mL premix        1 g 100 mL/hr over 30 Minutes Intravenous  Once 11/26/20 1248 11/26/20 1433   11/17/20 1415  fluconazole (DIFLUCAN) 40 MG/ML suspension 200 mg        200 mg Oral  Once 11/17/20 1315 11/17/20 1357   11/03/20 2200  meropenem (MERREM) 500 mg in sodium chloride 0.9 % 100 mL IVPB        500 mg 200 mL/hr over 30 Minutes Intravenous Every 24 hours 11/03/20 1500 11/06/20 2217   11/01/20 1000  anidulafungin (ERAXIS) 100 mg in sodium chloride 0.9 % 100 mL IVPB  Status:  Discontinued       "Followed by" Linked Group Details   100 mg 78 mL/hr over 100 Minutes Intravenous Every 24 hours 10/31/20 0916 11/01/20 1404   10/31/20 1015  meropenem (MERREM) 1 g in sodium chloride 0.9 % 100 mL IVPB  Status:  Discontinued        1 g 200 mL/hr over 30 Minutes Intravenous Every 8 hours 10/31/20 0916 11/03/20 1500   10/31/20 1015  linezolid (ZYVOX) IVPB 600 mg  Status:  Discontinued        600 mg 300 mL/hr over 60 Minutes Intravenous Every 12 hours 10/31/20 0916 11/02/20 0906   10/31/20 1015  anidulafungin (ERAXIS) 200 mg in sodium chloride 0.9 % 200 mL IVPB       "Followed by" Linked Group Details   200 mg 78 mL/hr over 200 Minutes Intravenous  Once 10/31/20 0916 10/31/20 1630   10/25/20 1800  ceFAZolin (ANCEF) IVPB 2g/100 mL premix  Status:  Discontinued        2 g 200 mL/hr over 30 Minutes Intravenous Every 12 hours 10/25/20 1022 10/30/20 1103   10/23/20 2000  vancomycin (VANCOREADY) IVPB 750 mg/150  mL  Status:  Discontinued        750 mg 150 mL/hr over 60 Minutes Intravenous Every 24 hours 10/22/20 2036 10/25/20 1022   10/23/20 1800  piperacillin-tazobactam (ZOSYN) IVPB 3.375 g  Status:  Discontinued        3.375 g 100 mL/hr over 30 Minutes Intravenous Every 6 hours 10/23/20 1155 10/24/20 1422   10/23/20 0200  piperacillin-tazobactam (ZOSYN) IVPB 3.375 g  Status:  Discontinued        3.375 g 100 mL/hr over 30 Minutes Intravenous Every 8 hours 10/22/20 2036 10/23/20 1155   10/22/20 1630  vancomycin (VANCOREADY) IVPB 1500 mg/300 mL        1,500 mg 150 mL/hr over 120 Minutes Intravenous  Once 10/22/20 1537 10/22/20 2229   10/22/20 1630  piperacillin-tazobactam (ZOSYN) IVPB 2.25 g  Status:  Discontinued        2.25 g 100 mL/hr over 30 Minutes Intravenous Every 8 hours 10/22/20 1537 10/22/20 2036   10/22/20 1537  vancomycin variable dose per unstable renal function (pharmacist dosing)  Status:  Discontinued         Does not apply See admin instructions 10/22/20 1537 10/22/20 2036   10/19/20 0600  ceFAZolin (ANCEF) IVPB 2g/100 mL premix        2 g 200 mL/hr over 30 Minutes Intravenous To Radiology 10/18/20 1457 10/19/20 0938   10/10/20 0830  vancomycin (VANCOCIN) IVPB 1000 mg/200 mL premix       "Followed by" Linked Group Details   1,000 mg 200 mL/hr over 60 Minutes Intravenous Every 24 hours 10/09/20 0744 10/15/20 0902   10/09/20 0830  piperacillin-tazobactam (ZOSYN) IVPB 3.375 g        3.375 g 100 mL/hr over 30 Minutes Intravenous Every 6 hours 10/09/20 0744 10/16/20 0038   10/09/20 0830  vancomycin (VANCOREADY) IVPB 2000 mg/400 mL       "Followed by" Linked Group Details   2,000 mg 200 mL/hr over 120 Minutes Intravenous  Once 10/09/20 0744 10/09/20 1022   09/24/20 1000  ceFAZolin (ANCEF) IVPB 2g/100 mL premix  Status:  Discontinued        2 g 200 mL/hr over 30 Minutes Intravenous Every 12 hours 09/24/20 0801 09/24/20 1046   09/23/20 1400  vancomycin (VANCOCIN) IVPB 1000 mg/200 mL  premix        1,000 mg 200 mL/hr over 60 Minutes Intravenous Every 24 hours 09/22/20 1436 09/28/20 1718   09/22/20 2200  ceFEPIme (MAXIPIME) 2 g in sodium chloride 0.9 % 100 mL IVPB  Status:  Discontinued        2 g 200 mL/hr over 30 Minutes Intravenous Every 12 hours 09/22/20 1436 09/24/20 0801   09/22/20 1030  ceFEPIme (MAXIPIME) 1 g in sodium chloride 0.9 % 100 mL IVPB        1 g 200 mL/hr over 30 Minutes Intravenous  Once 09/22/20 0934 09/22/20 1145   09/22/20 1015  vancomycin (VANCOCIN) IVPB 1000 mg/200 mL premix        1,000 mg 200 mL/hr over 60 Minutes Intravenous  Once 09/22/20 0934 09/22/20 1446   09/12/20 2200  ceFEPIme (MAXIPIME) 2 g in sodium chloride 0.9 % 100 mL IVPB        2 g 200 mL/hr over 30 Minutes Intravenous Every 12 hours 09/12/20 0732 09/14/20 2134   09/11/20 1400  ceFEPIme (MAXIPIME) 2 g in sodium chloride 0.9 % 100 mL IVPB  Status:  Discontinued        2 g 200 mL/hr over 30 Minutes Intravenous Every 8 hours 09/11/20 1126 09/12/20 0732   09/08/20 1000  vancomycin (VANCOREADY) IVPB 2000 mg/400 mL        2,000 mg 200 mL/hr over 120 Minutes Intravenous  Once 09/08/20 0857 09/08/20 1224   09/08/20 1000  ceFEPIme (MAXIPIME) 2 g in sodium chloride 0.9 % 100 mL IVPB  Status:  Discontinued        2 g 200 mL/hr over 30 Minutes Intravenous Every 12 hours 09/08/20 0857 09/11/20 1126   09/08/20 0856  vancomycin variable dose per unstable renal function (pharmacist dosing)  Status:  Discontinued         Does not apply See admin instructions 09/08/20 0857 09/09/20 0935   09/02/20 1600  cefTRIAXone (ROCEPHIN) 1 g in sodium chloride 0.9 % 100 mL IVPB  Status:  Discontinued        1 g 200 mL/hr over 30 Minutes Intravenous Every 24 hours 09/01/20 1811 09/02/20 0838   09/01/20 1800  fluconazole (DIFLUCAN) IVPB 400 mg        400 mg 50 mL/hr over 240 Minutes Intravenous  Once 09/01/20 1749 09/02/20 0603   09/01/20 1530  piperacillin-tazobactam (ZOSYN) IVPB 3.375 g        3.375  g 12.5 mL/hr over 240 Minutes Intravenous Every 8 hours 09/01/20 1514 09/05/20 2111   09/01/20 1000  levofloxacin (LEVAQUIN) tablet 250 mg  Status:  Discontinued        250 mg Oral Daily 08/31/20 1508 08/31/20 1735   08/31/20 1730  cefTRIAXone (ROCEPHIN) 1 g in sodium chloride 0.9 % 100 mL IVPB  Status:  Discontinued        1 g 200 mL/hr over 30 Minutes Intravenous Every 24 hours 08/31/20 1726 09/01/20 1513   08/31/20 1730  azithromycin (ZITHROMAX) 500 mg in sodium chloride 0.9 % 250 mL IVPB  Status:  Discontinued        500 mg 250 mL/hr over 60 Minutes Intravenous Every 24 hours 08/31/20 1726 09/02/20 0838       Time spent: 45 minutes    [name] ANP  [facility] 7 am - 330 pm/M-F for direct patient care and secure chat Please refer to Amion for contact info 110  days

## 2020-12-20 NOTE — Procedures (Signed)
PROCEDURE SUMMARY:  Successful removal of right IJ tunneled HD catheter, removed intact. No immediate complications.  EBL < 2 mL. Pressure dressing (gauze + tegaderm) applied to site. Patient tolerated well.   Discharge instructions: 1- Ok to shower 48 hours post-removal. 2- No submerging (swimming, bathing) for 7 days post-removal. 3- Ok to remove bandage after first shower, no further dressing changes needed- ensure area remains clean and dry until fully healed.  Please see imaging section of Epic for full dictation.   Claris Pong Nevaeha Finerty PA-C 12/20/2020 11:24 AM

## 2020-12-20 NOTE — Progress Notes (Signed)
Nutrition Follow-up  DOCUMENTATION CODES:   Not applicable  INTERVENTION:   Liberalize diet to REGULAR   ContinueEnsure Enlive po TID, each supplement provides 350 kcal and 20 grams of protein  ContinueMagic cup TID with meals, each supplement provides 290 kcal and 9 grams of protein  Double portion of protein at meals  Continue appetite stimulant (currently on Megace)  NUTRITION DIAGNOSIS:   Increased nutrient needs related to wound healing as evidenced by estimated needs.  Ongoing  GOAL:   Patient will meet greater than or equal to 90% of their needs  Progressing   MONITOR:   PO intake,Supplement acceptance,Labs,Weight trends,TF tolerance,Skin,I & O's  REASON FOR ASSESSMENT:   Consult New TPN/TNA  ASSESSMENT:   70 year old female patient with h/o diabetes, stage III chronic kidney disease, atrial fibrillation, hypertension, dyslipidemia and colon cancer s/p colectomy and colostomy who was admitted with COVID on 08/23/20 along with acute kidney injury and hypoxemic respiratory failure.  Pt was discharged home on 2 to 3 L of oxygen but returned to the ER on 08/31/2020 with atrial fibrillation with RVR and hemorrhagic shock r/t perforated duodenal ulcer now s/p exploratory laparotomy on 11/3 with adhesiolysis and modified graham patch.   Last HD on 2/12. Went for R IJ removal today. Trach decannulation on 2/16.   Cortrak clogged yesterday, pulled this am. Diet advanced to renal with fluid restriction. Recommend liberalization of diet to provide patient with more option. Daughter at bedside showed RD meal intake pictures on her phone. Patient meeting goal of consuming 50% of meals with two Nepros and one Magic Cup. RD encouraged further meal and supplement intake. Patient to try Ensure, hopeful she can consume more of these as they are a little less thick than Nepro.   Discussed the importance of continued protein intake at home for preservation of lean body mass and to  promote wound healing. Daughter has a supply of Ensure at home for patient.   Patient's intake progressing but still not meeting all of needs. She would likely benefit from long term tube but is planned to d/c home later this week.   UOP: 1300 ml x 24 hrs Colostomy: 660 ml x 24 hrs   Medications: SS novolog, levemir, megace Labs: CBG [id number]  Diet Order:   Diet Order            Diet renal with fluid restriction Room service appropriate? Yes; Fluid consistency: Thin  Diet effective now                 EDUCATION NEEDS:   Not appropriate for education at this time  Skin:  Skin Assessment: Skin Integrity Issues: Skin Integrity Issues:: Stage II DTI: sacrum Stage I: n/a Stage II: ear Incisions: abdomen  Last BM:  2/20  Height:   Ht Readings from Last 1 Encounters:  09/20/20 5\' 8"  (1.727 m)    Weight:   Wt Readings from Last 1 Encounters:  08/23/20 99.8 kg   BMI:  Body mass index is 31.04 kg/m.  Estimated Nutritional Needs:   Kcal:  2000-2300kcal/day  Protein:  100-120g/day  Fluid:  UOP +1L  Mariana Single RD, LDN Clinical Nutrition Pager listed in [place]

## 2020-12-20 NOTE — Consult Note (Signed)
[place] Nurse ostomy follow up I was able to verify that the patient's daughter, present in the room today and who has been helping her mother with the ostomy care "for years", is well versed in how to perform the ostomy care and does not have any questions.  Also, they order ostomy supplies through mail order.  A WOC team member will join PT on Wednesday to convert the [facility] therapy to a regular wound VAC. Supplies for Wednesday have been ordered. Val Riles, RN, MSN, CWOCN, CNS-BC, pager [phone]

## 2020-12-20 NOTE — Progress Notes (Signed)
[facility] ROUNDING NOTE   Subjective:   Her daughter is at bedside.   Had 1.3 liters UOP over 2/20 charted.  Per CM they are planning for her to eventually go home  Review of systems: Denies shortness of breath or chest pain  Denies n/v   Brief History: This is a 70 year old lady with a history of recent Covid pneumonia obesity: Status post colon resection and colostomy bag diabetes mellitus and acute kidney injury requiring dialysis.  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  She required dialysis for several months now with possible signs of renal recovery.  Last dialysis on 2/12   Objective:  Vital signs in last 24 hours:  Temp:  [98 F (36.7 C)-99.5 F (37.5 C)] 98 F (36.7 C) (02/21 0814) Pulse Rate:  [94-116] 112 (02/21 0814) Resp:  [18-24] 20 (02/21 0814) BP: (108-131)/(68-88) 121/88 (02/21 0814) SpO2:  [98 %-100 %] 100 % (02/21 0814)  Weight change:  Filed Weights    Intake/Output: I/O last 3 completed shifts: In: 540 [P.O.:540] Out: 2160 [Urine:1300; Stool:860]   Intake/Output this shift:  No intake/output data recorded.  General:NAD, resting comfortably, has tracheostomy   Heart: normal rate Lungs:clear b/l, no crackles Abdomen:soft, Non-tender, non-distended Extremities: no dependent edema. Dialysis Access: Right IJ TDC in place. Neurology: Alert awake and following commands, speaks softly   Basic Metabolic Panel: Recent Labs  Lab 12/16/20 0147 12/17/20 0336 12/18/20 0144 12/19/20 0957 12/20/20 0225  NA 133* 137 136 134* 136  K 4.5 4.3 4.9 4.7 5.1  CL 104 108 109 108 110  CO2 19* 20* 20* 19* 20*  GLUCOSE 263* 256* 254* 199* 161*  BUN 71* 60* 51* 40* 44*  CREATININE 0.83 0.70 0.65 0.61 0.70  CALCIUM 11.3* 11.0* 11.2* 11.4* 11.4*  PHOS 3.1 2.4* 2.7 3.2 3.1    Liver Function Tests: Recent Labs  Lab 12/16/20 0147 12/17/20 0336 12/18/20 0144 12/19/20 0957  12/20/20 0225  ALBUMIN 2.4* 2.4* 2.3* 2.4* 2.3*   No results for input(s): LIPASE, AMYLASE in the last 168 hours. No results for input(s): AMMONIA in the last 168 hours.  CBC: Recent Labs  Lab 12/16/20 0147  WBC 13.4*  HGB 9.2*  HCT 30.9*  MCV 87.5  PLT 305    Cardiac Enzymes: No results for input(s): CKTOTAL, CKMB, CKMBINDEX, TROPONINI in the last 168 hours.  BNP: Invalid input(s): POCBNP  CBG: Recent Labs  Lab 12/19/20 1557 12/19/20 2025 12/20/20 0005 12/20/20 0520 12/20/20 0812  GLUCAP 155* 242* 189* 133* 137*    Microbiology: Reviewed  Coagulation Studies: No results for input(s): LABPROT, INR in the last 72 hours.  Urinalysis: No results for input(s): COLORURINE, LABSPEC, PHURINE, GLUCOSEU, HGBUR, BILIRUBINUR, KETONESUR, PROTEINUR, UROBILINOGEN, NITRITE, LEUKOCYTESUR in the last 72 hours.  Invalid input(s): APPERANCEUR    Imaging: No results found.   Medications:   . albumin human 25 g (11/16/20 1516)   . apixaban  2.5 mg Oral BID  . carbamide peroxide  10 drop Both EARS BID  . chlorhexidine gluconate (MEDLINE KIT)  15 mL Mouth Rinse BID  . Chlorhexidine Gluconate Cloth  6 each Topical Q0600  . collagenase   Topical BID  . darbepoetin (ARANESP) injection - NON-DIALYSIS  100 mcg Subcutaneous Q Sun-1800  . diclofenac Sodium  2 g Topical QID  . feeding supplement (NEPRO CARB STEADY)  237 mL Oral TID BM  . feeding supplement (NEPRO CARB STEADY)  780 mL  Per Tube Q24H  . guaiFENesin  15 mL Per Tube Q12H   Or  . guaiFENesin  400 mg Oral Q12H  . insulin aspart  0-5 Units Subcutaneous QHS  . insulin detemir  12 Units Subcutaneous BID  . mouth rinse  15 mL Mouth Rinse QID  . megestrol  200 mg Oral QID  . metoprolol tartrate  37.5 mg Oral BID  . nutrition supplement (JUVEN)  1 packet Per Tube BID BM  . nystatin   Topical BID  . pantoprazole  40 mg Oral Daily  . sodium chloride  2 spray Each Nare Q6H  . sodium chloride flush  10-40 mL Intracatheter  Q12H   acetaminophen, albumin human, bacitracin, docusate, Gerhardt's butt cream, hydrOXYzine, insulin aspart, levalbuterol, ondansetron (ZOFRAN) IV, oxyCODONE **OR** oxyCODONE, phenol, polyethylene glycol, Resource ThickenUp Clear, silver nitrate applicators, sodium chloride, sodium chloride flush, sodium chloride HYPERTONIC  Assessment/ Plan:  #1.Severe AKI deemed ESRD now possible recovery:Suspected to be multifactorial acute kidney injury from septic shock/MRSA pneumonia and hemorrhagic shock at the outside. Has been on renal replacement therapy since 09/08/2020 and recently able to transition to intermittent hemodialysis from CRRT. Intermittent hemodialysis has been challenging due to hypotension and positional discomfort from decubitus ulcer.She is not a candidate for CIR and family have no desire to have her admitted to SNF or LTACand intend to take her home upon discharge.Dialysis now been paused since 2/12 - will change to renal diet in light of rising K; she's on nepro.  I spoke with her daughter about this as well - they've asked for a list of foods to avoid for now; can adjust diet per lab trends - note tunn catheter in place - consult IR for tunneled catheter removal - no longer needs HD  2.History of Covid pneumonia with ARDS: Status post tracheostomy.   3.Atrial fibrillation with rapid ventricular response: on metoprolol. On anticoagulation per primary team  4.Severe protein calorie malnutrition with large sacral decubitus ulcer: Ongoing nutritional supplementation with wound care.    5. Hypercalcemia: likely related to immobility.  Actual values likely higher due to hypoalbuminemia.  Gentle NS at 75 ml/hr x 12 hours  6Anemia: Complicated by GI bleed from perforated duodenal ulcer status post exploratory laparotomy with Phillip Heal patch placement. Received IV iron and ESA.  Monitor hemoglobin.  Discontinue ESA as renal failure resolving  7.  Hypotension patient was on  midodrine will discontinue midodrine.  At this point.  Blood pressure appears to be significantly improved  Nephrology will sign off.  Thankful for improvement in her renal function.  Please do not hesitate to contact us with any questions  Claudia Desanctis, MD 12/20/2020 10:01 AM

## 2020-12-20 NOTE — Progress Notes (Signed)
0000 round performed, patient wants to remain on L side and refuses to turn on R. Educated on importance to prevent further/ new skin breakdown. Patient nods in agreement but still does not wish to turn.

## 2020-12-20 NOTE — Progress Notes (Signed)
For 0600, patient wants to remain on back. She states she is not in any pain and does not want pain medicine. She has her nepro carb steady that she is drinking. Patient responds "no" when I ask her if there is anything else that she needs.

## 2020-12-20 NOTE — Progress Notes (Signed)
Pt Turned to left side due to sacral pain. Offered pain medication to pt. Pt refused at this time.

## 2020-12-21 DIAGNOSIS — I4891 Unspecified atrial fibrillation: Secondary | ICD-10-CM | POA: Diagnosis not present

## 2020-12-21 LAB — GLUCOSE, CAPILLARY
Glucose-Capillary: 104 mg/dL — ABNORMAL HIGH (ref 70–99)
Glucose-Capillary: 116 mg/dL — ABNORMAL HIGH (ref 70–99)
Glucose-Capillary: 149 mg/dL — ABNORMAL HIGH (ref 70–99)
Glucose-Capillary: 168 mg/dL — ABNORMAL HIGH (ref 70–99)
Glucose-Capillary: 271 mg/dL — ABNORMAL HIGH (ref 70–99)
Glucose-Capillary: 96 mg/dL (ref 70–99)

## 2020-12-21 LAB — RENAL FUNCTION PANEL
Albumin: 2.2 g/dL — ABNORMAL LOW (ref 3.5–5.0)
Anion gap: 6 (ref 5–15)
BUN: 28 mg/dL — ABNORMAL HIGH (ref 8–23)
CO2: 19 mmol/L — ABNORMAL LOW (ref 22–32)
Calcium: 11.1 mg/dL — ABNORMAL HIGH (ref 8.9–10.3)
Chloride: 112 mmol/L — ABNORMAL HIGH (ref 98–111)
Creatinine, Ser: 0.59 mg/dL (ref 0.44–1.00)
GFR, Estimated: >60 mL/min (ref >60)
Glucose, Bld: 120 mg/dL — ABNORMAL HIGH (ref 70–99)
Phosphorus: 2.9 mg/dL (ref 2.5–4.6)
Potassium: 5.1 mmol/L (ref 3.5–5.1)
Sodium: 137 mmol/L (ref 135–145)

## 2020-12-21 MED ORDER — DICLOFENAC SODIUM 1 % EX GEL: 2.0000 g | Freq: Four times a day (QID) | CUTANEOUS | 3 refills | Status: DC

## 2020-12-21 MED ORDER — COLLAGENASE 250 UNIT/GM EX OINT: TOPICAL_OINTMENT | Freq: Two times a day (BID) | CUTANEOUS | 0 refills | Status: DC

## 2020-12-21 MED ORDER — HYDROXYZINE HCL 10 MG PO TABS: 10.0000 mg | ORAL_TABLET | Freq: Three times a day (TID) | ORAL | 0 refills | Status: DC | PRN

## 2020-12-21 MED ORDER — OXYCODONE HCL 5 MG PO TABS: 5.0000 mg | ORAL_TABLET | ORAL | 0 refills | Status: AC | PRN

## 2020-12-21 MED ORDER — INSULIN STARTER KIT- PEN NEEDLES (ENGLISH)
1.0000 | Freq: Once | Status: AC
  Administered 2020-12-21: 1
  Filled 2020-12-21: qty 1

## 2020-12-21 MED ORDER — METOPROLOL TARTRATE 37.5 MG PO TABS: 37.5000 mg | ORAL_TABLET | Freq: Two times a day (BID) | ORAL | 3 refills | Status: AC

## 2020-12-21 MED ORDER — ENSURE ENLIVE PO LIQD: 237.0000 mL | Freq: Three times a day (TID) | ORAL | 12 refills | Status: DC

## 2020-12-21 MED ORDER — BLOOD GLUCOSE MONITOR KIT: PACK | 0 refills | Status: AC

## 2020-12-21 MED ORDER — PANTOPRAZOLE SODIUM 40 MG PO TBEC
40.0000 mg | DELAYED_RELEASE_TABLET | Freq: Every day | ORAL | 3 refills | Status: AC
Start: 2020-12-22 — End: ?

## 2020-12-21 MED ORDER — GERHARDT'S BUTT CREAM: 1.0000 "application " | TOPICAL_CREAM | CUTANEOUS | 3 refills | Status: DC | PRN

## 2020-12-21 MED ORDER — MEGESTROL ACETATE 400 MG/10ML PO SUSP: 200.0000 mg | Freq: Four times a day (QID) | ORAL | 3 refills | Status: DC

## 2020-12-21 MED ORDER — INSULIN GLARGINE 100 UNIT/ML SOLOSTAR PEN: 12.0000 [IU] | PEN_INJECTOR | Freq: Every day | SUBCUTANEOUS | 11 refills | Status: DC

## 2020-12-21 NOTE — Progress Notes (Signed)
SLP Cancellation Note  Patient Details Name: Candace [name] MRN: [id number] DOB: July 26, 1951   Cancelled treatment:       Reason Eval/Treat Not Completed: SLP screened, no needs identified, will sign off. SLP was reordered for potential diet advancement to thin liquids; however, pt is already on thin liquids (recommended initially 2/11). SLP will therefore defer this re-evaluation. NP made aware and in agreement.     Osie Bond., M.A. [facility] Pager [phone] Office [phone]  12/21/2020, 7:59 AM

## 2020-12-21 NOTE — Progress Notes (Signed)
Physical Therapy Treatment Patient Details Name: Candace [name] MRN: [id number] DOB: Jul 21, 1951 Today's Date: 12/21/2020    History of Present Illness Pt 70 y.o. female with medical history significant for recent covid pna, obesity, colon cancer s/p colon resection with colostomy bag, HLD, NIDDM2, CKD3 presented to ED after her follow-up nurse advised her to present to ED for elevated HR. +afib, elevated troponins with demand ischemia,  CT scan is positive for bowel perforation with pneumomediastinum 11/03 exp lap with repair of perforated ulcer. 11/10 t/f to ICU- intubated/sedated/proned. Trach placed 11/24. CRRT off 12/8, restarted 12/10-1/5. 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  Decannulated 12/15/20.  Coretrack feeding tube taken out 12/20/20.    PT Comments    Pt was medicated in the 5 am hour this morning.  PT came at 8:48 am and pt is due for pain meds again in the 9 am hour.  She tolerated lift OOB and bil UE and LE exercises and positioning in near full tilt in power WC.  Pt agreeable to try to stay up and get pain meds for PT to come back and attempt more upright sitting and pulling forward from chair.  Pt really needs to start working on trunk, fully upright sitting if our goal is to stand and eventually walk. I also discussed with pt/daughter, the importance of multiple lifts per day at home for OOB to chair as it is a better place to eat meals, good for her lungs to prevent PNA, and good for bowel function.  Continued to encourage pressure relief every 30 mins while up in the chair.  We talked about needing to tolerate the chair for things like MD appointments, her goal of getting back to church, etc.   Follow Up Recommendations  Home health PT;Supervision/Assistance - 24 hour     Equipment Recommendations  Wheelchair (measurements PT);Wheelchair cushion (measurements PT);Hospital bed;Other (comment)  (custom WC, hospital bed, air mattress, hoyer lift, WC ramp, ambulance transport home, HHPT, OT, RN)    Recommendations for Other Services OT consult     Precautions / Restrictions Precautions Precautions: Fall Precaution Comments: baseline R colostomy; sacral wound with wound vac Other Brace: B prafos, prvalons    Mobility  Bed Mobility Overal bed mobility: Needs Assistance Bed Mobility: Rolling Rolling: Max assist         General bed mobility comments: Max assist to roll with one person AA to help flex knees and reach with arm across her body to the rail.    Transfers Overall transfer level: Needs assistance               General transfer comment: transferred OOB to [facility] with maxi move.  Ambulation/Gait                 Theme park manager mobility: Yes Wheelchair Assistance Details (indicate cue type and reason): Continue to encourage independence in Horace adjustment by patient.  She still needs min cues for buttons, but when instructed can use buttons.  Modified Rankin (Stroke Patients Only)       Balance       Sitting balance - Comments: unable to put chair upright to practice coming forward in sitting as pt is too painful despite cushion.  Pt was pre medicated at 5 am this morning and is about due for pain  meds again as PT is starting my session.  RN to bring pain meds, PT to go see another patient and come back to see if Banita can tolerate more therapy at that time.                                    Cognition Arousal/Alertness: Awake/alert Behavior During Therapy: WFL for tasks assessed/performed Overall Cognitive Status: Impaired/Different from baseline Area of Impairment: Memory                     Memory: Decreased short-term memory         General Comments: When PT called to pre medicate pt RN reported pt showed her the schedule on the wall that PT posted  yesterday.  She is using compensatory strategies well.      Exercises General Exercises - Upper Extremity Shoulder Flexion: AAROM;Both;10 reps Elbow Flexion: AAROM;Both;10 reps Elbow Extension: AAROM;Both;10 reps General Exercises - Lower Extremity Ankle Circles/Pumps: AAROM;Both;10 reps;Supine Short Arc Quad: AAROM;Both;10 reps Heel Slides: AAROM;Both;10 reps Other Exercises Other Exercises: PNF pattern bil UE x 10 each    General Comments        Pertinent Vitals/Pain Pain Assessment: Faces Faces Pain Scale: Hurts whole lot Pain Location: bottom Pain Descriptors / Indicators: Guarding;Grimacing Pain Intervention(s): Monitored during session;Limited activity within patient's tolerance;Repositioned    Home Living                      Prior Function            PT Goals (current goals can now be found in the care plan section) Acute Rehab PT Goals Patient Stated Goal: go home this week! Progress towards PT goals: Progressing toward goals    Frequency    Min 3X/week      PT Plan Current plan remains appropriate    Co-evaluation              AM-PAC PT "6 Clicks" Mobility   Outcome Measure  Help needed turning from your back to your side while in a flat bed without using bedrails?: A Lot Help needed moving from lying on your back to sitting on the side of a flat bed without using bedrails?: Total Help needed moving to and from a bed to a chair (including a wheelchair)?: Total Help needed standing up from a chair using your arms (e.g., wheelchair or bedside chair)?: Total Help needed to walk in hospital room?: Total Help needed climbing 3-5 steps with a railing? : Total 6 Click Score: 7    End of Session   Activity Tolerance: Patient limited by pain Patient left: in chair;Other (comment) (in electric WC tilted near maximally.) Nurse Communication: Mobility status;Need for lift equipment;Other (comment) (call me back if needed, secure chatted my  number) PT Visit Diagnosis: Muscle weakness (generalized) (M62.81);Difficulty in walking, not elsewhere classified (R26.2);Pain;Adult, failure to thrive (R62.7);Unsteadiness on feet (R26.81) Pain - Right/Left: Right Pain - part of body: Knee     Time: 9024-0973 PT Time Calculation (min) (ACUTE ONLY): 71 min  Charges:  $Therapeutic Exercise: 23-37 mins $Therapeutic Activity: 38-52 mins                     Verdene Lennert, PT, DPT  Acute Rehabilitation [phone] pager [phone]) [phone] office

## 2020-12-21 NOTE — Progress Notes (Signed)
Received page from [place], [place] with [facility] team.  RN alerted me that Ms. Chern will be discharging home in the next day or 2 with Lantus Insulin pen (one injection per day).  Daughter will be helping pt with Insulin injection and CBG checks.  HHRN also will be helping when pt gets home.  Called RN Katie on unit 2W.  RN has already taught daughter how to check CBGs at home and according to RN daughter did well with practice.  Informed RN that I have asked one of the Diabetes RNs on [facility] campus to touch base with pt's daughter and show daughter how to use insulin pen at home (this DM RN currently working on [facility] campus but on call for the entire health system).  Will follow  --Will follow patient during hospitalization--  Wyn Quaker RN, MSN, CDE Diabetes Coordinator Inpatient Glycemic Control Team Team Pager: [phone] (8a-5p)

## 2020-12-21 NOTE — TOC Initial Note (Addendum)
Transition of Care [facility]) - Initial/Assessment Note    Patient Details  Name: Candace [name] MRN: [id number] Date of Birth: 11-18-1950  Transition of Care [facility]) CM/SW Contact:    [name], RN Phone Number: 12/21/2020, 10:40 AM  Clinical Narrative:                 Case management met with the patient and daughter, Candace [name], at the bedside regarding transitions of care to home.  The patient and family are aware that home health has been set up with [facility] to receive home services including home health RN, PT, OT, MSW, and aide and daughter is aware that home health services will reach out to her to set up times for visits that will begin within 24-48 hours after discharge to the home to set up based on availability with home health staff and family.  The daughter states that the home is now equipped with an outside ramp and that she plans to take the patient's electric wheelchair home prior to her discharging by ambulance to the home when all dme equipment is set up appropriately.  I spoke with the daughter, Candace [name], and states that the patient is medically clear for discharge at this point and the patient will be able to discharge home once the wound vac is delivered to the hospital room and specialty bed to the patient's home.  Family is available to assist the patient 24 hours per day.  I called and spoke with [name], CM at [facility] and confirmed that the hospital bed needs to be delivered to the patient's home today or tomorrow prior to patient being discharged to home.  The patient' daughter, Candace [name], is aware and is expecting a telephone call from [place].  I spoke with the daughter, Candace [name], along with [name], NP at the bedside today and confirmed that the patient will need blood sugar checks at home with the glucometer and will need insulin injections from family according to physician discharge orders.  I sent a CM note to TOC admin. Staff to check on benefit check for ordered  insulin as necessary.  The patient's daughter, Candace [name], is aware of all discharge home services available to the patient and these services will be listed in the discharge instructions.  The patient's daughter, Candace [name], verbabalized understanding.  CM and MSW will continue to follow for transitions of care needs.  12/21/2020 1104 - [name], CM with [facility] called and states that the [facility] wound vac will be delivered to the hospital room today in preparation for discharge to home in the next 1-2 days.  12/21/20 1358- The patient's family will need diabetes teaching regarding diet, insulin administration and glucose monitoring.  I spoke with [name], NP and she is aware.  Diabetes coordinator consult placed regarding need for diabetic teaching for discharge - Bedside nursing is aware.  12/21/20 1415- CM spoke with Sherlyn Hay, RN diabetes coordinator, and she will be following up with the patient's daughter to arrange diabetes discharge teaching at the bedside regarding diabetes management including insulin administration, diet and blood sugar monitoring.    CM will continue to follow for transition of care needs.  Expected Discharge Plan: [place] Barriers to Discharge: Continued Medical Work up (plans to discharge home with family with home health services)   Patient Goals and CMS Choice Patient states their goals for this hospitalization and ongoing recovery are:: Patient plans on discharging home with family. CMS Medicare.gov Compare Post Acute Care list provided  to:: Patient Choice offered to / list presented to : Patient  Expected Discharge Plan and Services Expected Discharge Plan: Home w Home Health Services In-house Referral: Clinical Social Work Discharge Planning Services: CM Consult Post Acute Care Choice: Durable Medical Equipment,Home Health Living arrangements for the past 2 months: Single Family Home                 DME Arranged: Specialty  bed,Trapeze,Hospital bed,Wheelchair electric (Hoyer lift) DME Agency: [facility] Date DME Agency Contacted: 12/21/20 Time DME Agency Contacted: 1037 Representative spoke with at DME Agency: spoke with Shiela, CM at [employer] about delivering hospital bed to the home today or tomorrow when arranged with the patient's daughter, [name] today HH Arranged: RN,Nurse's Aide,PT,OT HH Agency: [facility] Date HH Agency Contacted: 12/21/20 Time HH Agency Contacted: 1038 Representative spoke with at HH Agency: Cory, RNCM with [facility]  Prior Living Arrangements/Services Living arrangements for the past 2 months: Single Family Home Lives with:: Adult Children Patient language and need for interpreter reviewed:: Yes Do you feel safe going back to the place where you live?: Yes      Need for Family Participation in Patient Care: Yes (Comment) Care giver support system in place?: Yes (comment) Current home services: Home RN (current with [facility]) Criminal Activity/Legal Involvement Pertinent to Current Situation/Hospitalization: No - Comment as needed  Activities of Daily Living Home Assistive Devices/Equipment: None ADL Screening (condition at time of admission) Patient's cognitive ability adequate to safely complete daily activities?: Yes Is the patient deaf or have difficulty hearing?: No Does the patient have difficulty seeing, even when wearing glasses/contacts?: No Does the patient have difficulty concentrating, remembering, or making decisions?: No Patient able to express need for assistance with ADLs?: No Does the patient have difficulty dressing or bathing?: No Independently performs ADLs?: Yes (appropriate for developmental age) Does the patient have difficulty walking or climbing stairs?: No Weakness of Legs: None Weakness of Arms/Hands: None  Permission Sought/Granted Permission sought to share information with : Case Manager,Family Supports Permission granted to share  information with : Yes, Verbal Permission Granted  Share Information with NAME: daughter, [name] - [phone]  Permission granted to share info w AGENCY: [facility] for home health, [facility] for hospital bed, hoyer lift and trapeze bar, electric wheelchair  Permission granted to share info w Relationship: daughter - [name]     Emotional Assessment Appearance:: Appears stated age Attitude/Demeanor/Rapport: Engaged Affect (typically observed): Accepting Orientation: : Oriented to Self,Oriented to Place,Oriented to  Time,Oriented to Situation Alcohol / Substance Use: Never Used Psych Involvement: No (comment)  Admission diagnosis:  Hypoxia [R09.02] Atrial fibrillation with rapid ventricular response (HCC) [I48.91] Atrial fibrillation with RVR (HCC) [I48.91] COVID-19 [U07.1] Patient Active Problem List   Diagnosis Date Noted  . HAP (hospital-acquired pneumonia)   . Status post tracheostomy (HCC)   . ESRD (end stage renal disease) (HCC)   . Acute and chronic respiratory failure (acute-on-chronic) (HCC)   . Shock (HCC)   . Perforated duodenal ulcer (HCC)   . On mechanically assisted ventilation (HCC)   . Palliative care by specialist   . Goals of care, counseling/discussion   . Acute hypoxemic respiratory failure (HCC)   . ARDS (adult respiratory distress syndrome) (HCC)   . Pressure injury of skin 09/08/2020  . Hypoxia   . Acute respiratory failure due to COVID-19 (HCC) 08/31/2020  . New onset a-fib (HCC) 08/31/2020  . Atrial fibrillation with RVR (HCC) 08/31/2020  . Leukocytosis 08/31/2020  . Atrial fibrillation with rapid   ventricular response ([place]) 08/31/2020  . AKI (acute kidney injury) ([place]) 08/23/2020   PCP:  Algis Greenhouse, MD Pharmacy:   [facility] [place], [place] [address] [place] [address] Phone: [phone] Fax: [phone]     Social Determinants of Health ([place]) Interventions    Readmission Risk  Interventions Readmission Risk Prevention Plan 11/19/2020  Transportation Screening Complete  Medication Review ([place]) Complete  PCP or Specialist appointment within 3-5 days of discharge Complete  HRI or Home Care Consult Complete  SW Recovery Care/Counseling Consult Complete  Palliative Care Screening Complete  Skilled Nursing Facility Complete  Some recent data might be hidden

## 2020-12-21 NOTE — Plan of Care (Signed)
  Problem: Education: Goal: Utilization of techniques to improve thought processes will improve Outcome: Progressing Goal: Knowledge of the prescribed therapeutic regimen will improve Outcome: Progressing   Problem: Activity: Goal: Interest or engagement in leisure activities will improve Outcome: Progressing Goal: Imbalance in normal sleep/wake cycle will improve Outcome: Progressing   Problem: Coping: Goal: Coping ability will improve Outcome: Progressing Goal: Will verbalize feelings Outcome: Progressing   Problem: Health Behavior/Discharge Planning: Goal: Ability to make decisions will improve Outcome: Progressing Goal: Compliance with therapeutic regimen will improve Outcome: Progressing   Problem: Role Relationship: Goal: Will demonstrate positive changes in social behaviors and relationships Outcome: Progressing   Problem: Safety: Goal: Ability to identify and utilize support systems that promote safety will improve Outcome: Progressing   Problem: Self-Concept: Goal: Level of anxiety will decrease Outcome: Progressing   Problem: Education: Goal: Knowledge of General Education information will improve Description: Including pain rating scale, medication(s)/side effects and non-pharmacologic comfort measures Outcome: Progressing   Problem: Health Behavior/Discharge Planning: Goal: Ability to manage health-related needs will improve Outcome: Progressing   Problem: Clinical Measurements: Goal: Ability to maintain clinical measurements within normal limits will improve Outcome: Progressing Goal: Will remain free from infection Outcome: Progressing Goal: Diagnostic test results will improve Outcome: Progressing Goal: Respiratory complications will improve Outcome: Progressing Goal: Cardiovascular complication will be avoided Outcome: Progressing   Problem: Activity: Goal: Risk for activity intolerance will decrease Outcome: Progressing   Problem:  Coping: Goal: Level of anxiety will decrease Outcome: Progressing   Problem: Nutrition: Goal: Adequate nutrition will be maintained Outcome: Progressing   Problem: Elimination: Goal: Will not experience complications related to bowel motility Outcome: Progressing Goal: Will not experience complications related to urinary retention Outcome: Progressing   Problem: Pain Managment: Goal: General experience of comfort will improve Outcome: Progressing   Problem: Safety: Goal: Ability to remain free from injury will improve Outcome: Progressing   Problem: Skin Integrity: Goal: Risk for impaired skin integrity will decrease Outcome: Progressing   Problem: Activity: Goal: Ability to tolerate increased activity will improve Outcome: Progressing   Problem: Respiratory: Goal: Ability to maintain a clear airway and adequate ventilation will improve Outcome: Progressing   Problem: Role Relationship: Goal: Method of communication will improve Outcome: Progressing   Problem: Education: Goal: Knowledge of disease and its progression will improve Outcome: Progressing   Problem: Fluid Volume: Goal: Compliance with measures to maintain balanced fluid volume will improve Outcome: Progressing   Problem: Health Behavior/Discharge Planning: Goal: Ability to manage health-related needs will improve Outcome: Progressing   Problem: Nutritional: Goal: Ability to make healthy dietary choices will improve Outcome: Progressing   Problem: Clinical Measurements: Goal: Complications related to the disease process, condition or treatment will be avoided or minimized Outcome: Progressing   Problem: Education: Goal: Knowledge of risk factors and measures for prevention of condition will improve Outcome: Progressing

## 2020-12-21 NOTE — TOC Benefit Eligibility Note (Signed)
Transition of Care [facility]) Benefit Eligibility Note    Patient Details  Name: Genevie Elman MRN: [id number] Date of Birth: June 25, 1951   Medication/Dose: LANTUS   NO DOSE PROVIDED  Covered?: Yes  Tier: 3 Drug  Prescription Coverage Preferred Pharmacy: [facility] [place] with Person/Company/Phone Number:: [id number]  @ [place] [facility] # [phone]  Co-Pay: $ 47.00  Prior Approval: No  Deductible:  (NO DEDUCTIBLE  WITH PLAN and   OUT-OF-POCKET :Linton Ham Phone Number: 12/21/2020, 10:59 AM

## 2020-12-21 NOTE — Progress Notes (Signed)
Physical Therapy Treatment Patient Details Name: Candace [name] MRN: [id number] DOB: 10-25-1951 Today's Date: 12/21/2020    History of Present Illness Pt 70 y.o. female with medical history significant for recent covid pna, obesity, colon cancer s/p colon resection with colostomy bag, HLD, NIDDM2, CKD3 presented to ED after her follow-up nurse advised her to present to ED for elevated HR. +afib, elevated troponins with demand ischemia,  CT scan is positive for bowel perforation with pneumomediastinum 11/03 exp lap with repair of perforated ulcer. 11/10 t/f to ICU- intubated/sedated/proned. Trach placed 11/24. CRRT off 12/8, restarted 12/10-1/5. 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  Decannulated 12/15/20.  Coretrack feeding tube taken out 12/20/20.    PT Comments    Returned to see if pt could tolerate more upright therapy once medicated.  Pt, however, was too painful to continue in the [facility] or attempt more therapy.  She was up for an hour which is more than yesterday.  PT and LPN worked together to get her positioned comfortably on her side back in the bed.  Educated LPN on use of lift and pt's preferred positioning that daughter has been doing with her to get her fully off her bottom in side lying.   I spoke with OT to update her on progress, plan to d/c home later this week or early next week, calling to pre medicate, and schedule of going at 8:30 am.  PT will continue to follow acutely for safe mobility progression.  Follow Up Recommendations  Home health PT;Supervision/Assistance - 24 hour     Equipment Recommendations  Wheelchair (measurements PT);Wheelchair cushion (measurements PT);Hospital bed;Other (comment) (custom WC, hospital bed, air mattress, hoyer lift, WC ramp, ambulance transport home, HHPT, OT, RN)    Recommendations for Other Services OT consult     Precautions / Restrictions  Precautions Precautions: Fall Precaution Comments: baseline R colostomy; sacral wound with wound vac Required Braces or Orthoses: Other Brace Other Brace: B prafos, prvalons    Mobility  Bed Mobility Overal bed mobility: Needs Assistance Bed Mobility: Rolling Rolling: +2 for physical assistance;Max assist         General bed mobility comments: Two person max assist to roll to get pt off of lift pad, two person max assist to position on her side for relief of pressure on bottom and position with pillows for comfort.    Transfers Overall transfer level: Needs assistance               General transfer comment: transfer from [facility] back to bed after ~ 1 hour up in the [place].  Pt unable to tolerate more therapy despite more pain meds and wanted to return to bed.  LPN assisting PT with return to bed, positioning, getting pt comfortable.  I educated LPN on lift use, and preferred positioning techniques that daughter uses with pt.  Ambulation/Gait                 Theme park manager mobility: Yes Wheelchair Assistance Details (indicate cue type and reason): Continue to encourage independence in [place] adjustment by patient.  She still needs min cues for buttons, but when instructed can use buttons.  Modified Rankin (Stroke Patients Only)       Balance       Sitting balance - Comments: I hoped to be able  to practice more fully upright sitting and pulling forward for pt from [facility], but pt wanted to return to bed as she was in too much pain to continue.  We are still working on timing of pain meds based on posted schedule.  Relayed update to OT who will be working with her tomorrow.                                    Cognition Arousal/Alertness: Awake/alert Behavior During Therapy: WFL for tasks assessed/performed Overall Cognitive Status: Impaired/Different from baseline Area of Impairment: Memory                      Memory: Decreased short-term memory         General Comments: When PT called to pre medicate pt RN reported pt showed her the schedule on the wall that PT posted yesterday.  She is using compensatory strategies well.      Exercises General Exercises - Upper Extremity Shoulder Flexion: AAROM;Both;10 reps Elbow Flexion: AAROM;Both;10 reps Elbow Extension: AAROM;Both;10 reps General Exercises - Lower Extremity Ankle Circles/Pumps: AAROM;Both;10 reps;Supine Short Arc Quad: AAROM;Both;10 reps Heel Slides: AAROM;Both;10 reps Other Exercises Other Exercises: PNF pattern bil UE x 10 each    General Comments        Pertinent Vitals/Pain Pain Assessment: Faces Faces Pain Scale: Hurts whole lot Pain Location: bottom Pain Descriptors / Indicators: Guarding;Grimacing Pain Intervention(s): Limited activity within patient's tolerance;Monitored during session;Premedicated before session;Repositioned    Home Living                      Prior Function            PT Goals (current goals can now be found in the care plan section) Acute Rehab PT Goals Patient Stated Goal: go home this week! Progress towards PT goals: Progressing toward goals    Frequency    Min 3X/week      PT Plan Current plan remains appropriate    Co-evaluation              AM-PAC PT "6 Clicks" Mobility   Outcome Measure  Help needed turning from your back to your side while in a flat bed without using bedrails?: A Lot Help needed moving from lying on your back to sitting on the side of a flat bed without using bedrails?: Total Help needed moving to and from a bed to a chair (including a wheelchair)?: Total Help needed standing up from a chair using your arms (e.g., wheelchair or bedside chair)?: Total Help needed to walk in hospital room?: Total Help needed climbing 3-5 steps with a railing? : Total 6 Click Score: 7    End of Session   Activity Tolerance: Patient  limited by pain Patient left: in bed;with call bell/phone within reach;with nursing/sitter in room Nurse Communication: Mobility status;Need for lift equipment;Other (comment) (call me back if needed, secure chatted my number) PT Visit Diagnosis: Muscle weakness (generalized) (M62.81);Difficulty in walking, not elsewhere classified (R26.2);Pain;Adult, failure to thrive (R62.7);Unsteadiness on feet (R26.81) Pain - Right/Left: Right Pain - part of body: Knee     Time: 2376-2831 PT Time Calculation (min) (ACUTE ONLY): 31 min  Charges:  $Therapeutic Exercise: 23-37 mins $Therapeutic Activity: 23-37 mins                     Verdene Lennert, PT, DPT  Acute Rehabilitation #[phone] pager #[phone] office

## 2020-12-21 NOTE — Progress Notes (Signed)
Inpatient Diabetes Program Recommendations  AACE/ADA: New Consensus Statement on Inpatient Glycemic Control (2015)  Target Ranges:  Prepandial:   less than 140 mg/dL      Peak postprandial:   less than 180 mg/dL (1-2 hours)      Critically ill patients:  140 - 180 mg/dL   Lab Results  Component Value Date   GLUCAP 96 12/21/2020   HGBA1C 6.2 (H) 12/01/2020    Review of Glycemic Control  Inpatient Diabetes Program Recommendations:   Educated patient and daughter on insulin pen use at home. Reviewed contents of insulin flexpen starter kit. Reviewed all steps if insulin pen including attachment of needle, 2-unit air shot, dialing up dose, giving injection, removing needle, disposal of sharps, storage of unused insulin, disposal of insulin etc. Daughter able to provide successful return demonstration. Also reviewed troubleshooting with insulin pen. MD to give patient Rxs for insulin pens and insulin pen needles.  Also reviewed insulin administration using syringe and daughter verbalized understanding.  Reviewed with patient and daughter to keep log of CBGs and take to MD appointments or take glucometer for MD to review to assist in insulin adjustments.  Thank you, Nani Gasser. Laksh Hinners, RN, MSN, CDE  Diabetes Coordinator Inpatient Glycemic Control Team Team Pager [phone] (8am-5pm) 12/21/2020 2:58 PM

## 2020-12-21 NOTE — Progress Notes (Addendum)
[facility] PROGRESS NOTE    Candace [name] OFB:[id number] DOB: Oct 01, 1951 DOA: 08/31/2020 PCP: Algis Greenhouse, MD           Status: Remains inpatient appropriate because:Ongoing diagnostic testing needed not appropriate for outpatient work up, Unsafe d/c plan, IV treatments appropriate due to intensity of illness or inability to take PO and Inpatient level of care appropriate due to severity of illness   Dispo: The patient is from: Home              Anticipated d/c is to: Home with home health -anticipate later this week versus early next week pending clearance from nephrology and ability to obtain appropriate DME and services prior to discharge.              Anticipated d/c date is: > 3 days              Patient currently is not medically stable to d/c.  Barriers to discharge: Currently stable off of dialysis nephrologist as of 2/21 has ordered removal of tunneled dialysis catheter noting no indication to continue dialysis at this juncture but recommendation to continue renal diet at home.  Eported that no longer requiring hydrotherapy since wound bed clean but will need to discharge with [facility], hospital bed, specialty mattress/air mattress.  Family states transport Lucianne Lei already in place and paid for by [employer].   Code Status: Full Family Communication: 2/22 daughter [name] at bedside-updated that if all appropriate DME available at home by 2/23 plan is to discharge patient home on that date.  Patient does not have glucometer and 1 will be ordered upon discharge. DVT prophylaxis: Eliquis Vaccination status: Has not been vaccinated but did have severe COVID infection initially diagnosed on 08/23/2020.  Patient will be eligible for COVID-vaccine after 11/23/2020   Foley catheter: No, purewick female urinary collection device   HPI: 70 year old female patient with prior history of diabetes, stage III chronic kidney disease, atrial fibrillation, hypertension, dyslipidemia  and colon cancer that is post colectomy and colostomy.  Initially diagnosed with COVID on 08/23/20 she was admitted to the hospital due to dehydration, acute kidney injury and hypoxemic respiratory failure.  She was discharged home on 2 to 3 L of oxygen after being treated with IV steroids and remdesivir.  During that time although she had elevated inflammatory markers she had no embolic or thrombotic disease.  She had been on prophylactic dose heparin during the hospitalization and was placed on Eliquis for 2 weeks for after discharge  Patient returned to the ER on 08/31/2020 for complaints of not feeling well.  She was found to be in atrial fibrillation with RVR, hemorrhagic shock and work-up revealed a perforated duodenal ulcer.  She underwent exploratory laparotomy on 11/3.  She has had an extensive ICU stay with significant events as below.   11/2 Admitted with rapid Afib 11/3 OR with findings of perforated duodenal ulcer 11/10 progressive hemorrhagic shock, intubated, transfused, pressors, proned; started on CRRT in PM 11/16 Extubated. Re-intubated overnight due to respiratory distress and hypoxia with decreased mentation 11/18 Bronch, cultures sent 11/19 Hgb down getting blood 11/24 Spiked fever, resume empirical antimicrobial therapy 11/26 Hemorrhagic shock, hgb 5.6, increased pressors,  11/30 Per palliative "[name] understanding that patient is unfortunately very fragiledespite ongoing intensive medical treatment and full mechanical support. [name] that the familywantsto continue with all current interventions despite potential outcomes". 12/08 CRRT discontinued due to clotting 12/09 Family requested transfer to tertiary care [facility]). [facility] denied transfer  12/10  CRRT restarted. Episodes of tachycardia, tachypnea that seem to improve with pain management 12/11 Back in shock. Pressor requirements up. CXR worse. ABX resumed 12/12 Still requiring inc pressors. Had hypoglycemic  event. Basal insulin dosing adjusted 12/13 Pressor requirements better. Now hyperglycemic. Re-adjusted Glycemic control  12/19 Afebrile . Remains on dilaudid and heparin gtt, dilaudid gtt increased overnight for concern of pain / ongoing tachycardia, no other events . NE and precedex off 12/17. Ongoing CRRT- even UF, dosing lokelmia this morning 12/20 On CRRT. Renal plans for HD cath and moving to HD. Getting wound care 12/24 - renal stopping CRRT today and plans HD 10/24/20 . 40% fio2 on vent via Trach. TAchypenic and tachycardic. Afebrile but wbc up to 27.6K. On TF. On heparin gtt 12/25 - Back on CRRT. On vent via trach at 40% fio2, On scheduled dilaudid as add on to oxy. Per family request 12/24 - they felt scheduled oxy was not adequate and patient was showing signs of opioid withdrawal.  Patient also had worsening SIRS/sepsis syndrome. Had fever, rising wbc, worsening RR 40-60 and HR 140s sinus->started On abx yesterday. Fever some better today. WBC plateau at 28,.5K. On new levophed ->signifanct improvement in HR 77 and RR t0 20. On heparin gtt. On precedex gtt. On levophed gtt 36mg wthi midodrine. On TF 12/30 Remains on CRRT with intermittent pressor requirements. Ethics consult placed 12/29. Ethics rec time trial of CRRT 12/31 failed SBT with RR 40s. Several conversations between care tam and family, who are upset RE plan of care 1/1 back on pressors  1/4: On pressors, keeping even on CVVHD, HGB drop to 6.9, transfused 1 unit. Improving mental status 1/10 remains on low dose levophed 252m 1/11 off levo, attempting HD with UF for first time. Now tolerating intermittent HD. 1/19 patient transferred to progressive bed with tracheostomy on 35% FiO2.  has NG tube feeding.  No PEG tube.  Received dialysis on 1/18. 1/24 significant improvement and persistent tachycardia after introduction of twice daily beta-blocker.  Heart rates have improved from the 130s to the 98-104 range 1/24 core track  clogged and therefore has been removed by nutrition team.  Calorie count in progress and we are weaning any sedating medications which could be contributing to patient's inability to eat. 1/27 continue w/ cuff #6 trach (eventually downsized to #4) 2/16 Decannulated 2/21 tunneled dialysis catheter and core track tube removed  Subjective: Patient awake and sitting up in wheelchair.  Daughter at bedside.  Discussing discharge planning.  Tentative plans to discharge tomorrow on 2/23 pending delivery of DME to patient's home.  Patient does not have glucometer therefore prescription placed in discharge orders.  Also clarifying which long-acting insulin that her insurance will pay for [facility].  Daughter aware of all of this.  Patient reports that she feels like she is able to swallow better with core track tube out and overall feels much more comfortable with tube out of her nose.   Objective: Vitals:   12/21/20 0515 12/21/20 0626  BP: 120/85 (!) 127/33  Pulse: 95 90  Resp: 20 20  Temp: (!) 97.5 F (36.4 C) 98 F (36.7 C)  SpO2: 90% (!) 89%    Intake/Output Summary (Last 24 hours) at 12/21/2020 0741 Last data filed at 12/21/2020 0600 Gross per 24 hour  Intake 1032.19 ml  Output 1450 ml  Net -417.81 ml   Filed Weights    Exam: Constitutional: Awake and oriented in no acute distress Respiratory: Lungs clear to auscultation  she is stable on room air, trach site unremarkable. Cardiovascular: Heart sounds are normal, pulses regular, extremities warm to touch without any peripheral edema Abdomen: Soft nontender nondistended.  Bowel sounds present LBM 2/20-Regular diet and thin liquids-patient typically chews up food to get moisture out and then spits out.  Tolerates liquid protein shakes and other calorie laden fluids better. Skin:  Massive sacral decubitus ulcer-wound VAC with serosanguineous drainage noted in collection system Neurologic: CN 2-12 grossly intact.  Sensation intact, DTR normal. Strength 1/5 x all 4 extremities.  Psychiatric: Alert and oriented x3, appropriate mood   Assessment/Plan: Acute problems: PAF maintaining sinus rhythm w/persistent tachycardia -Continue metoprolol to 37.5 mg BID-this will be continued upon discharge -Echocardiogram this admission with an EF 50 to 55% with mild diastolic dysfunction parameters and normal RV systolic function -Continue Eliquis-benefits coordinator has determined that Eliquis is covered at [facility] in [place] with a co-pay of $47  Acute respiratory failure secondary to COVID-pneumonia/tracheostomy/acute MSSA tracheobronchitis -Patient stable on low-flow oxygen FiO2 21% -2/16 decannulated has remained stable with prior insertion site healing well -Completed IV Ancef for MSSA tracheobronchitis  Right ear pain/possible impacted cerumen -Evaluated by attending physician over the weekend and was noted to have significant amount of cerumen in ear.  -Some wax has been spontaneously expunged from ear after utilization of Debrox  Acute kidney injury secondary to COVID-related sepsis with shock superimposed on stage III chronic kidney disease -On 2/21 decision made to no longer pursue dialysis and tunneled catheter discontinued in IR -Avoid nephrotoxic medications -Despite normal renal function which is influenced by low muscle mass nephrology recommends continuing a renal-based diet given low-grade hyperkalemia and ongoing hypercalcemia  Lower extremity muscle pain -Continue Voltaren gel.  Patient clarifies this is not a cramping intermittent pain but a constant pain.  This will be continued at discharge  Anxiety -Continue as needed Vistaril after discharge  Dysphagia/moderate to severe protein calorie malnutrition Nutrition Status: Nutrition Problem: Increased nutrient needs Etiology: wound healing Signs/Symptoms: estimated needs Interventions: Refer to RD note for recommendations  Estimated body  mass index is 31.04 kg/m as calculated from the following:   Height as of this encounter: 5' 8"  (1.727 m).   Weight as of this encounter: 92.6 kg.  -Continue Megace -I have changed from Nepro to Ensure Enlive but since patient is to remain on renal diet may need to consider changing this back to Nepro -Unable to unclog core track tube.  Discussed at length with daughter and given that patient otherwise will be ready for discharge home later in the week plan is to discontinue core track tube and allow for oral feedings only with supplemental protein shakes.  Daughter aware to touch base with patient's primary care provider if her mother continues to have issues with inadequate oral intake.  Patient may eventually require PEG tube -Juven and Prosource discontinued since not intended to be taken orally-patient encouraged to drink oral protein shake  Acute hemorrhagic shock secondary to perforated duodenal ulcer/anemia of critical illness (initial reason for admission) -Resolved -Hemoglobin remains stable greater than 8.0  Diabetes mellitus 2 -Patient has been transitioned to regular diet to facilitate oral intake -CBGs increasing as PO intake increase-continue Levemir 5 units BID -HgbA1c 08/24/2020 and was 7.0 -Prior to admission patient was on metformin XR-unable to use at this juncture due to severity of kidney disease -we are hopeful for recovery of renal function  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 -SNF recommended but family wants to take patient home -Patient  continues to make small but impressive improvements in mobility daily -Patient now has a power wheelchair which she has been able to utilize as of last week-does require Hoyer lift to transfer-doing well with power wheelchair -Family request no SNF rehab due to pt prior request to NEVER be placed in any type of facility -PT and OT working on an alternating schedule with one of the other therapy  would see the patient daily Monday through Friday prior to hydrotherapy -Patient will not have hydrotherapy after discharge and will have a different VAC system in place.  Patient will need to follow-up with general surgery for wound management after discharge.  Stage IV sacral decubitus -Not present on admission -Wound care nurse following-continue now has wound VAC -wound has improved significantly and apparently PT is now recommending discontinuation of hydrotherapy -Surgical team consulted.  Current recommendation is against pursuing general anesthesia to undergo extensive surgical procedure-hopefully can be reevaluated in the future if wound does not heal adequately.  As of 1/28 they have nothing further acutely to add but will be following peripherally.  Patient will need to follow-up with the surgical team for outpatient wound management.  Plan is to continue [facility] after discharge. -Continue Kreg rotational bed while inpatient but will transition to hospital bed with mattress overlay/air mattress at discharge -Continue specialty motorized wheelchair -Offered to keep patient in the hospital for a longer period of time to allow for continued wound care but daughter stated she preferred to go ahead and have the patient discharged to home where family can manage her care -Follow-up with general surgery after discharge  -  11/24/2020 after [facility]      11/22/2020                       11/29/2020     12/06/2020                             12/13/2020   Incision (Closed) 09/01/20 Abdomen (Active)  Date First Assessed/Time First Assessed: 09/01/20 1737   Location: Abdomen    Assessments 09/01/2020  6:25 PM 12/20/2020  8:31 PM  Dressing Type Gauze (Comment) Abdominal pads  Dressing -- Clean;Dry;Intact  Dressing Change Frequency -- Daily  Site / Wound Assessment Dressing in place / Unable to assess Dressing in place / Unable to assess  Drainage Amount None Scant     No Linked orders to display      Pressure Injury 09/17/20 Ear Left;Anterior;Posterior Stage 2 -  Partial thickness loss of dermis presenting as a shallow open injury with a red, pink wound bed without slough. (Active)  Date First Assessed/Time First Assessed: 09/17/20 0800   Location: Ear  Location Orientation: Left;Anterior;Posterior  Staging: Stage 2 -  Partial thickness loss of dermis presenting as a shallow open injury with a red, pink wound bed without slough. ...    Assessments 09/17/2020  8:00 AM 12/05/2020  8:00 PM  Dressing Type Foam - Lift dressing to assess site every shift None  Dressing Clean;Dry;Intact --  Dressing Change Frequency Every 3 days --  Site / Wound Assessment Dry;Pink --  Peri-wound Assessment Intact --  Wound Length (cm) 2 cm --  Wound Width (cm) 1 cm --  Wound Depth (cm) 0.25 cm --  Wound Surface Area (cm^2) 2 cm^2 --  Wound Volume (cm^3) 0.5 cm^3 --  Margins Unattached edges (unapproximated) --  Drainage Amount Scant --  Drainage Description  Serosanguineous --  Treatment Cleansed --     No Linked orders to display     Pressure Injury 09/25/20 Sacrum Bilateral;Medial Deep Tissue Pressure Injury - Purple or maroon localized area of discolored intact skin or blood-filled blister due to damage of underlying soft tissue from pressure and/or shear. Purple, non-blanchable, b (Active)  Date First Assessed/Time First Assessed: 09/25/20 2000   Location: Sacrum  Location Orientation: Bilateral;Medial  Staging: Deep Tissue Pressure Injury - Purple or maroon localized area of discolored intact skin or blood-filled blister due to damage o...    Assessments 09/25/2020  5:00 PM 12/20/2020  8:31 PM  Dressing Type Foam - Lift dressing to assess site every shift Barrier Film (skin prep);Negative pressure wound therapy  Dressing Changed;Clean;Dry;Intact Clean;Dry;Intact  State of Healing -- Early/partial granulation  Site / Wound Assessment -- Dressing in place / Unable to assess  Drainage Amount -- Minimal   Drainage Description -- Serous  Treatment -- Debridement (Selective);Negative pressure wound therapy     No Linked orders to display     Negative Pressure Wound Therapy Sacrum (Active)  Placement Date/Time: 11/22/20 1500   Wound Type: (c) Other (Comment)  Location: Sacrum    Assessments 11/22/2020  4:59 PM 12/20/2020  8:31 PM  Last dressing change 11/22/20 --  Site / Wound Assessment Granulation tissue;Pale;Yellow Clean;Dry  Peri-wound Assessment Intact Intact  Size see above --  Wound filler - Black foam 2 --  Wound filler - White foam 0 --  Wound filler - Nonadherent 0 --  Wound filler - Gauze 0 --  Cycle Continuous Continuous  Target Pressure (mmHg) 125 125  Instillation Volume 26 mL --  Instillation Solution Normal Saline Normal Saline  Instillation Soak Time 10 minutes --  Instillation Therapy Time 3.5 hours --  Canister Changed No Yes  Machine plugged into wall outlet (NOT bed outlet) -- Yes  Dressing Status Intact Intact  Drainage Amount None --  Output (mL) -- 50 mL     No Linked orders to display     Other problems: Coag neg staph-1/2 blood cultures positive -dw ID pharmacist- cultures c/w contamination Vancomycin ordered overnight stopped on 2/8  Acute encephalopathy secondary to prolonged hospital stay -Resolved  History of stage IV colon cancer -Old colostomy  Acute encephalopathy with ICU delirium -Delirium has resolved and supportive meds of Klonopin and Seroquel have been weaned and discontinued  Hypomagnesemia -Continue oral magnesium -1/21 give 4 g magnesium IV x1 -Follow labs  DVT bilateral posterior tibial veins -Was not present during initial COVID admission -Continue Eliquis for now given severe debility and increased risk for developing recurrent DVT  Hypertension -Having issues with orthostasis requiring midodrine Prior to admission patient was on Norvasc  Dyslipidemia -Prior to admission patient was on Crestor-consider resuming soon  once patient can swallow pills whole  Abnormal TSH -Nov 2021 TSH was 0.015-TSH and free T4, T3 normal this admission  Data Reviewed: Basic Metabolic Panel: Recent Labs  Lab 12/17/20 0336 12/18/20 0144 12/19/20 0957 12/20/20 0225 12/21/20 0500  NA 137 136 134* 136 137  K 4.3 4.9 4.7 5.1 5.1  CL 108 109 108 110 112*  CO2 20* 20* 19* 20* 19*  GLUCOSE 256* 254* 199* 161* 120*  BUN 60* 51* 40* 44* 28*  CREATININE 0.70 0.65 0.61 0.70 0.59  CALCIUM 11.0* 11.2* 11.4* 11.4* 11.1*  PHOS 2.4* 2.7 3.2 3.1 2.9   Liver Function Tests: Recent Labs  Lab 12/17/20 0336 12/18/20 0144 12/19/20 0957 12/20/20 0225 12/21/20  0500  ALBUMIN 2.4* 2.3* 2.4* 2.3* 2.2*   No results for input(s): LIPASE, AMYLASE in the last 168 hours. No results for input(s): AMMONIA in the last 168 hours. CBC: Recent Labs  Lab 12/16/20 0147  WBC 13.4*  HGB 9.2*  HCT 30.9*  MCV 87.5  PLT 305   Cardiac Enzymes: No results for input(s): CKTOTAL, CKMB, CKMBINDEX, TROPONINI in the last 168 hours. BNP (last 3 results) Recent Labs    09/07/20 0118 09/08/20 0446 09/09/20 0428  BNP 216.8* 432.5* 609.7*    ProBNP (last 3 results) No results for input(s): PROBNP in the last 8760 hours.  CBG: Recent Labs  Lab 12/20/20 1224 12/20/20 1631 12/20/20 1912 12/20/20 2301 12/21/20 0533  GLUCAP 139* 141* 240* 169* 116*    No results found for this or any previous visit (from the past 240 hour(s)).   Studies: IR Removal Tun Cv Cath W/O FL  Result Date: 12/20/2020 INDICATION: Patient with history of AKI in need of HD s/p right IJ tunneled HD catheter placed in IR 10/19/2020; most recently exchange in IR 11/29/2020. Patient now in renal recovery. Request is made for removal of tunneled HD catheter. EXAM: REMOVAL OF TUNNELED HEMODIALYSIS CATHETER MEDICATIONS: None COMPLICATIONS: None immediate. PROCEDURE: Informed written consent was obtained from the patient's daughter following an explanation of the procedure,  risks, benefits and alternatives to treatment. A time out was performed prior to the initiation of the procedure. Maximal barrier sterile technique was utilized including caps, mask, sterile gowns, sterile gloves, large sterile drape, hand hygiene, and ChloraPrep. Utilizing blunt dissection, the catheter was removed intact. Hemostasis was obtained with manual compression. A dressing was placed. The patient tolerated the procedure well without immediate post procedural complication. IMPRESSION: Successful removal of tunneled dialysis catheter. Read by: Earley Abide, PA-C Electronically Signed   By: Ruthann Cancer MD   On: 12/20/2020 11:29    Scheduled Meds: . apixaban  2.5 mg Oral BID  . carbamide peroxide  10 drop Both EARS BID  . chlorhexidine gluconate (MEDLINE KIT)  15 mL Mouth Rinse BID  . Chlorhexidine Gluconate Cloth  6 each Topical Q0600  . collagenase   Topical BID  . diclofenac Sodium  2 g Topical QID  . feeding supplement  237 mL Oral TID BM  . guaiFENesin  15 mL Per Tube Q12H   Or  . guaiFENesin  400 mg Oral Q12H  . insulin aspart  0-5 Units Subcutaneous QHS  . insulin detemir  12 Units Subcutaneous BID  . mouth rinse  15 mL Mouth Rinse QID  . megestrol  200 mg Oral QID  . metoprolol tartrate  37.5 mg Oral BID  . nystatin   Topical BID  . pantoprazole  40 mg Oral Daily  . sodium chloride  2 spray Each Nare Q6H  . sodium chloride flush  10-40 mL Intracatheter Q12H   Continuous Infusions: . albumin human 25 g (11/16/20 1516)    Principal Problem:   Atrial fibrillation with RVR (HCC) Active Problems:   Acute respiratory failure due to COVID-19 [facility])   New onset a-fib (HCC)   Leukocytosis   Atrial fibrillation with rapid ventricular response (HCC)   Hypoxia   Pressure injury of skin   Acute hypoxemic respiratory failure (HCC)   ARDS (adult respiratory distress syndrome) ([place])   Perforated duodenal ulcer ([place])   On mechanically assisted ventilation ([place])   Palliative  care by specialist   Goals of care, counseling/discussion   Shock ([place])  Acute and chronic respiratory failure (acute-on-chronic) (HCC)   Status post tracheostomy ([place])   ESRD (end stage renal disease) ([place])   HAP (hospital-acquired pneumonia)   Consultants:  Cardiology  Surgery  Nephrology  Ethics  PCCM   Procedures: R PICC 11/5 >> A line 11/9 >> out ETT 11/9 > 11/16, 11/16 >> 09/21/2020, 09/21/2020 tracheostomy>> Lt [place] CVL 11/9 >> R IJ trialysis >> out HD catheter 12/1 >>12/20 12/21 - 14.5 Fr, 23 cm right IJ tunneled hemodialysis catheter placement. Removal of indwelling subclavian catheter.   Antibiotics: Anti-infectives (From admission, onward)   Start     Dose/Rate Route Frequency Ordered Stop   12/07/20 1200  vancomycin (VANCOCIN) IVPB 1000 mg/200 mL premix  Status:  Discontinued        1,000 mg 200 mL/hr over 60 Minutes Intravenous Every T-Th-Sa (Hemodialysis) 12/06/20 2156 12/07/20 1113   12/06/20 2245  vancomycin (VANCOREADY) IVPB 2000 mg/400 mL        2,000 mg 200 mL/hr over 120 Minutes Intravenous  Once 12/06/20 2156 12/07/20 0211   11/27/20 2000  ceFAZolin (ANCEF) IVPB 1 g/50 mL premix        1 g 100 mL/hr over 30 Minutes Intravenous Every 24 hours 11/26/20 1248 12/03/20 2136   11/26/20 1345  ceFAZolin (ANCEF) IVPB 1 g/50 mL premix        1 g 100 mL/hr over 30 Minutes Intravenous  Once 11/26/20 1248 11/26/20 1433   11/17/20 1415  fluconazole (DIFLUCAN) 40 MG/ML suspension 200 mg        200 mg Oral  Once 11/17/20 1315 11/17/20 1357   11/03/20 2200  meropenem (MERREM) 500 mg in sodium chloride 0.9 % 100 mL IVPB        500 mg 200 mL/hr over 30 Minutes Intravenous Every 24 hours 11/03/20 1500 11/06/20 2217   11/01/20 1000  anidulafungin (ERAXIS) 100 mg in sodium chloride 0.9 % 100 mL IVPB  Status:  Discontinued       "Followed by" Linked Group Details   100 mg 78 mL/hr over 100 Minutes Intravenous Every 24 hours 10/31/20 0916 11/01/20 1404   10/31/20  1015  meropenem (MERREM) 1 g in sodium chloride 0.9 % 100 mL IVPB  Status:  Discontinued        1 g 200 mL/hr over 30 Minutes Intravenous Every 8 hours 10/31/20 0916 11/03/20 1500   10/31/20 1015  linezolid (ZYVOX) IVPB 600 mg  Status:  Discontinued        600 mg 300 mL/hr over 60 Minutes Intravenous Every 12 hours 10/31/20 0916 11/02/20 0906   10/31/20 1015  anidulafungin (ERAXIS) 200 mg in sodium chloride 0.9 % 200 mL IVPB       "Followed by" Linked Group Details   200 mg 78 mL/hr over 200 Minutes Intravenous  Once 10/31/20 0916 10/31/20 1630   10/25/20 1800  ceFAZolin (ANCEF) IVPB 2g/100 mL premix  Status:  Discontinued        2 g 200 mL/hr over 30 Minutes Intravenous Every 12 hours 10/25/20 1022 10/30/20 1103   10/23/20 2000  vancomycin (VANCOREADY) IVPB 750 mg/150 mL  Status:  Discontinued        750 mg 150 mL/hr over 60 Minutes Intravenous Every 24 hours 10/22/20 2036 10/25/20 1022   10/23/20 1800  piperacillin-tazobactam (ZOSYN) IVPB 3.375 g  Status:  Discontinued        3.375 g 100 mL/hr over 30 Minutes Intravenous Every 6 hours 10/23/20 1155 10/24/20 1422   10/23/20  0200  piperacillin-tazobactam (ZOSYN) IVPB 3.375 g  Status:  Discontinued        3.375 g 100 mL/hr over 30 Minutes Intravenous Every 8 hours 10/22/20 2036 10/23/20 1155   10/22/20 1630  vancomycin (VANCOREADY) IVPB 1500 mg/300 mL        1,500 mg 150 mL/hr over 120 Minutes Intravenous  Once 10/22/20 1537 10/22/20 2229   10/22/20 1630  piperacillin-tazobactam (ZOSYN) IVPB 2.25 g  Status:  Discontinued        2.25 g 100 mL/hr over 30 Minutes Intravenous Every 8 hours 10/22/20 1537 10/22/20 2036   10/22/20 1537  vancomycin variable dose per unstable renal function (pharmacist dosing)  Status:  Discontinued         Does not apply See admin instructions 10/22/20 1537 10/22/20 2036   10/19/20 0600  ceFAZolin (ANCEF) IVPB 2g/100 mL premix        2 g 200 mL/hr over 30 Minutes Intravenous To Radiology 10/18/20 1457 10/19/20  0938   10/10/20 0830  vancomycin (VANCOCIN) IVPB 1000 mg/200 mL premix       "Followed by" Linked Group Details   1,000 mg 200 mL/hr over 60 Minutes Intravenous Every 24 hours 10/09/20 0744 10/15/20 0902   10/09/20 0830  piperacillin-tazobactam (ZOSYN) IVPB 3.375 g        3.375 g 100 mL/hr over 30 Minutes Intravenous Every 6 hours 10/09/20 0744 10/16/20 0038   10/09/20 0830  vancomycin (VANCOREADY) IVPB 2000 mg/400 mL       "Followed by" Linked Group Details   2,000 mg 200 mL/hr over 120 Minutes Intravenous  Once 10/09/20 0744 10/09/20 1022   09/24/20 1000  ceFAZolin (ANCEF) IVPB 2g/100 mL premix  Status:  Discontinued        2 g 200 mL/hr over 30 Minutes Intravenous Every 12 hours 09/24/20 0801 09/24/20 1046   09/23/20 1400  vancomycin (VANCOCIN) IVPB 1000 mg/200 mL premix        1,000 mg 200 mL/hr over 60 Minutes Intravenous Every 24 hours 09/22/20 1436 09/28/20 1718   09/22/20 2200  ceFEPIme (MAXIPIME) 2 g in sodium chloride 0.9 % 100 mL IVPB  Status:  Discontinued        2 g 200 mL/hr over 30 Minutes Intravenous Every 12 hours 09/22/20 1436 09/24/20 0801   09/22/20 1030  ceFEPIme (MAXIPIME) 1 g in sodium chloride 0.9 % 100 mL IVPB        1 g 200 mL/hr over 30 Minutes Intravenous  Once 09/22/20 0934 09/22/20 1145   09/22/20 1015  vancomycin (VANCOCIN) IVPB 1000 mg/200 mL premix        1,000 mg 200 mL/hr over 60 Minutes Intravenous  Once 09/22/20 0934 09/22/20 1446   09/12/20 2200  ceFEPIme (MAXIPIME) 2 g in sodium chloride 0.9 % 100 mL IVPB        2 g 200 mL/hr over 30 Minutes Intravenous Every 12 hours 09/12/20 0732 09/14/20 2134   09/11/20 1400  ceFEPIme (MAXIPIME) 2 g in sodium chloride 0.9 % 100 mL IVPB  Status:  Discontinued        2 g 200 mL/hr over 30 Minutes Intravenous Every 8 hours 09/11/20 1126 09/12/20 0732   09/08/20 1000  vancomycin (VANCOREADY) IVPB 2000 mg/400 mL        2,000 mg 200 mL/hr over 120 Minutes Intravenous  Once 09/08/20 0857 09/08/20 1224   09/08/20  1000  ceFEPIme (MAXIPIME) 2 g in sodium chloride 0.9 % 100 mL IVPB  Status:  Discontinued        2 g 200 mL/hr over 30 Minutes Intravenous Every 12 hours 09/08/20 0857 09/11/20 1126   09/08/20 0856  vancomycin variable dose per unstable renal function (pharmacist dosing)  Status:  Discontinued         Does not apply See admin instructions 09/08/20 0857 09/09/20 0935   09/02/20 1600  cefTRIAXone (ROCEPHIN) 1 g in sodium chloride 0.9 % 100 mL IVPB  Status:  Discontinued        1 g 200 mL/hr over 30 Minutes Intravenous Every 24 hours 09/01/20 1811 09/02/20 0838   09/01/20 1800  fluconazole (DIFLUCAN) IVPB 400 mg        400 mg 50 mL/hr over 240 Minutes Intravenous  Once 09/01/20 1749 09/02/20 0603   09/01/20 1530  piperacillin-tazobactam (ZOSYN) IVPB 3.375 g        3.375 g 12.5 mL/hr over 240 Minutes Intravenous Every 8 hours 09/01/20 1514 09/05/20 2111   09/01/20 1000  levofloxacin (LEVAQUIN) tablet 250 mg  Status:  Discontinued        250 mg Oral Daily 08/31/20 1508 08/31/20 1735   08/31/20 1730  cefTRIAXone (ROCEPHIN) 1 g in sodium chloride 0.9 % 100 mL IVPB  Status:  Discontinued        1 g 200 mL/hr over 30 Minutes Intravenous Every 24 hours 08/31/20 1726 09/01/20 1513   08/31/20 1730  azithromycin (ZITHROMAX) 500 mg in sodium chloride 0.9 % 250 mL IVPB  Status:  Discontinued        500 mg 250 mL/hr over 60 Minutes Intravenous Every 24 hours 08/31/20 1726 09/02/20 0838       Time spent: 45 minutes    [name] ANP  [facility] 7 am - 330 pm/M-F for direct patient care and secure chat Please refer to Amion for contact info 111  days

## 2020-12-21 NOTE — Plan of Care (Signed)
  Problem: Education: Goal: Utilization of techniques to improve thought processes will improve Outcome: Progressing Goal: Knowledge of the prescribed therapeutic regimen will improve Outcome: Progressing   Problem: Activity: Goal: Interest or engagement in leisure activities will improve Outcome: Progressing Goal: Imbalance in normal sleep/wake cycle will improve Outcome: Progressing   Problem: Coping: Goal: Coping ability will improve Outcome: Progressing Goal: Will verbalize feelings Outcome: Progressing   Problem: Health Behavior/Discharge Planning: Goal: Ability to make decisions will improve Outcome: Progressing Goal: Compliance with therapeutic regimen will improve Outcome: Progressing   Problem: Role Relationship: Goal: Will demonstrate positive changes in social behaviors and relationships Outcome: Progressing   Problem: Safety: Goal: Ability to identify and utilize support systems that promote safety will improve Outcome: Progressing   Problem: Self-Concept: Goal: Level of anxiety will decrease Outcome: Progressing   Problem: Education: Goal: Knowledge of General Education information will improve Description: Including pain rating scale, medication(s)/side effects and non-pharmacologic comfort measures Outcome: Progressing   Problem: Health Behavior/Discharge Planning: Goal: Ability to manage health-related needs will improve Outcome: Progressing   Problem: Clinical Measurements: Goal: Ability to maintain clinical measurements within normal limits will improve Outcome: Progressing Goal: Will remain free from infection Outcome: Progressing Goal: Diagnostic test results will improve Outcome: Progressing Goal: Respiratory complications will improve Outcome: Progressing Goal: Cardiovascular complication will be avoided Outcome: Progressing   Problem: Activity: Goal: Risk for activity intolerance will decrease Outcome: Progressing   Problem:  Nutrition: Goal: Adequate nutrition will be maintained Outcome: Progressing   Problem: Coping: Goal: Level of anxiety will decrease Outcome: Progressing   Problem: Elimination: Goal: Will not experience complications related to bowel motility Outcome: Progressing Goal: Will not experience complications related to urinary retention Outcome: Progressing   Problem: Pain Managment: Goal: General experience of comfort will improve Outcome: Progressing   Problem: Safety: Goal: Ability to remain free from injury will improve Outcome: Progressing   Problem: Skin Integrity: Goal: Risk for impaired skin integrity will decrease Outcome: Progressing   Problem: Activity: Goal: Ability to tolerate increased activity will improve Outcome: Progressing   Problem: Respiratory: Goal: Ability to maintain a clear airway and adequate ventilation will improve Outcome: Progressing   Problem: Role Relationship: Goal: Method of communication will improve Outcome: Progressing

## 2020-12-21 NOTE — Progress Notes (Addendum)
1930 Did assessment w/daughter present at bedside. We rotated pt to Lt side and adjusted legs. Daughter assisted w/turning of pt. We went over education about pain meds and trying to give pt pain meds before AM therapy session. Pt is resting comfortably at this time.   2100 Readjusted pt legs. Removed pillows and took all covers off. Instructed pt that I will be back in 10-15 mins. Offered drink and pt declined.  2215 Readjusted pts legs, did ROM at 3 different times and gave meds that pt wanted. She refused some meds please see MAR. Pt wanted some pudding. This nurse went to 3 units to find vanilla pudding. Pt requested pain meds at this time. Pt given another Nephro drink as requested. This nurse spent over 45 mins at this encounter. I reassured pt that I would check on her but I wouldn't wake her up unless it was two hours. Pt REQUESTED that I wake her up ONLY if she calls. Will reposition pt q2-3 hrs.   0045 Readjusted pt legs and rotated per pt request. Pt asked me why I always call her sir. I stated to pt that's not true I call you ma'am. I reassured to pt that I wouldn't do that and maybe she's not hearing me clearly with the mask on. I spent 20 mins during this encounter.   0200 Pt is sleeping. Woke pt up and she fell back asleep. Will reassess.     0340 Pt is sleeping. Vitals taken, nutrients offered, drinks given. Pt said no to pain meds. I told pt I would be back around 5ish to bathe, change dressing, reposition and give pain meds before I leave. Pt stated okay.   0545 Pt prepped for bath, CHG wipes, ostomy drained, dressing change, and rotation in bed. Pt will be given pain med as late as possible so she will be pain free for therapy. This nurse and CNA spent 30 mins during this encounter.   0645 Pt labs sent down and pain med given. Pt stated no complaints at this time.

## 2020-12-21 NOTE — Progress Notes (Addendum)
2000 Pt turned(Lt side)by this nurse and daughter. Daughter stated that we are ONLY turning to the Left and back w/pillow support.   2030 Pt given night meds along w/pain meds and wound vac canister changed(w/ Mindy,RN).  Pt refused several meds and stated that she has it done in the AM. I asked for a second time and she still said no.   2200 Pt is readjusted in bed(pt request) and still on Lt side.  0035 Pt asked to be readjusted in bed(Lt side) and ROM of Rt leg. Extra pillows added under Rt leg.  0240 Pt is resting comfortably on rt side. Will reassess.   330 Pt is sleep. Adjusted legs, pt slept through it.   0515 Pt is upset that I didn't wake her up for pain meds. I explained to pt that she was sleeping and there is no need for me to wake her up. Pain meds given at this time. Pt prepped for CHG bath, full linen and gown change, change ABD dressing, emptying of colostomy and purewick and rotation in bed to the Lt side. Pt offered brushing of teeth and swabbing of the mouth but she refused multiple times. Pt also refused being rubbed with lotion or medicated ointment on her legs. She stated that she will wait. Pt requested orange juice and soda.   0730 pt given orange juice.

## 2020-12-21 NOTE — Progress Notes (Signed)
Taught pt.'s daughter how to use a glucometer and how to check CBG. All questions were answered. Pt.'s daughter demonstrated correct CBG check while using glucometer.

## 2020-12-21 NOTE — Progress Notes (Signed)
8:09am: gave pt. Medications this am, pt. Refused ear drops, voltaren cream, and nose spray at this time. Attempted to reposition pt. Made several attempts with another nurse's assistance, however despite many repositioning methods, pt. Stated, "I want to stay on my left side."  10:45am: Assisted physical therapy to get pt. Out of chair to get back into bed per pt. Request after staying in chair approximately 20-30 minutes. Pt. Was very uncomfortable, stated she wanted to stay on her back, then right when this nurse was getting ready to walk out, pt. States, "I want to be repositioned." Pt. Was placed on the left side with a pillow per pt. Request.   10:56am: attempted to give pt. meds that were previously refused earlier in the am, however pt. Refused again stating, "I want to rest."  12:45pm: Pt. Was attempted to be repositioned on to right side, however pt. Stated it was uncomfortable, pt. Requested to be turned back to left side.   2:50pm: Pt. Was repositioned with the assistance of daughter onto the back. Pt. Complained of being uncomfortable, therefore, pt was turned back to left side.   5:00pm: Per pt. And daughter request, pt. Wants to stay on left side.   6:49pm: Daughter calls out to this nurse to reposition pt. Again. Attempted to reposition pt. To right right, however pt. Remained uncomfortable on this side. Pt. Was turned back to left side.

## 2020-12-22 DIAGNOSIS — I4891 Unspecified atrial fibrillation: Secondary | ICD-10-CM | POA: Diagnosis not present

## 2020-12-22 LAB — GLUCOSE, CAPILLARY
Glucose-Capillary: 174 mg/dL — ABNORMAL HIGH (ref 70–99)
Glucose-Capillary: 140 mg/dL — ABNORMAL HIGH (ref 70–99)
Glucose-Capillary: 151 mg/dL — ABNORMAL HIGH (ref 70–99)
Glucose-Capillary: 151 mg/dL — ABNORMAL HIGH (ref 70–99)
Glucose-Capillary: 246 mg/dL — ABNORMAL HIGH (ref 70–99)

## 2020-12-22 LAB — RENAL FUNCTION PANEL
Albumin: 2.1 g/dL — ABNORMAL LOW (ref 3.5–5.0)
Anion gap: 8 (ref 5–15)
BUN: 26 mg/dL — ABNORMAL HIGH (ref 8–23)
CO2: 17 mmol/L — ABNORMAL LOW (ref 22–32)
Calcium: 11.4 mg/dL — ABNORMAL HIGH (ref 8.9–10.3)
Chloride: 113 mmol/L — ABNORMAL HIGH (ref 98–111)
Creatinine, Ser: 0.64 mg/dL (ref 0.44–1.00)
GFR, Estimated: >60 mL/min (ref >60)
Glucose, Bld: 203 mg/dL — ABNORMAL HIGH (ref 70–99)
Phosphorus: 3 mg/dL (ref 2.5–4.6)
Potassium: 5.3 mmol/L — ABNORMAL HIGH (ref 3.5–5.1)
Sodium: 138 mmol/L (ref 135–145)

## 2020-12-22 NOTE — Discharge Summary (Addendum)
Physician Discharge Summary  Lechelle Wrigley BPZ:[id number] DOB: 06-24-1951 DOA: 08/31/2020  PCP: Algis Greenhouse, MD  Admit date: 08/31/2020 Discharge date: 12/23/2020    Happy DC Day!!!  Time spent: 50 minutes  Recommendations for Outpatient Follow-up:  1. Patient needs to follow-up with primary care physician Dr. Garlon Hatchet in 1 to 2 weeks after discharge 2. Patient has an appointment scheduled with general surgery provider Dr. Bobbye Morton on 01/25/2021 at 2:20 PM.  The office asked that the patient arrive at 1:50 PM.  Paperwork that needs completion prior to arrival will be mailed to the patient's home 3. Patient will be receiving home health services through [facility]; DME will be provided by [facility] and she will also be sent home with a [employer] VAC that will be applied prior to discharge.  This device will be placed at 150 cm of suction. 4. Patient did have paroxysmal atrial fibrillation during her ICU stay.  An ambulatory referral to the atrial fibrillation clinic has been made and they will be contacting the patient after discharge.   Discharge Diagnoses:  Principal Problem:   Atrial fibrillation with RVR (HCC) Active Problems:   Acute respiratory failure due to COVID-19 [facility])   New onset a-fib (HCC)   Leukocytosis   Atrial fibrillation with rapid ventricular response (HCC)   Hypoxia   Pressure injury of skin   Acute hypoxemic respiratory failure (HCC)   ARDS (adult respiratory distress syndrome) (HCC)   Perforated duodenal ulcer (HCC)   On mechanically assisted ventilation ([place])   Palliative care by specialist   Goals of care, counseling/discussion   Shock ([place])   Acute and chronic respiratory failure (acute-on-chronic) ([place])   Status post tracheostomy ([place])   ESRD (end stage renal disease) ([place])   HAP (hospital-acquired pneumonia)  SEPSIS RESOLVED  Discharge Condition: Stable  Diet recommendation: Renal low potassium diet without fluid restriction  Filed Weights     History of present illness:  70 year old female patient with prior history of diabetes, stage III chronic kidney disease, atrial fibrillation, hypertension, dyslipidemia and colon cancer that is post colectomy and colostomy.  Initially diagnosed with COVID on 08/23/20 she was admitted to the hospital due to dehydration, acute kidney injury and hypoxemic respiratory failure.  She was discharged home on 2 to 3 L of oxygen after being treated with IV steroids and remdesivir.  During that time although she had elevated inflammatory markers she had no embolic or thrombotic disease.  She had been on prophylactic dose heparin during the hospitalization and was placed on Eliquis for 2 weeks for after discharge  Patient returned to the ER on 08/31/2020 for complaints of not feeling well.  She was found to be in atrial fibrillation with RVR, hemorrhagic shock and work-up revealed a perforated duodenal ulcer.  She underwent exploratory laparotomy on 11/3.  She has had an extensive ICU stay.  She was eventually transferred to to [facility] progressive care unit.  Her condition slowly improved.  She required a Cortrack tube for feedings, initially she required a tracheostomy but was eventually decannulated.  Most importantly she had AKI that required regular hemodialysis.  Fortunately her renal function recovered and on 2/21 the decision was made to stop hemodialysis and her tunneled dialysis catheter was removed.  Unfortunately during her time in the hospital patient developed a very large sacral decubitus that has required VAC therapy as well as hydrotherapy.  She will discharge home with a VAC in place with instructions to follow-up with general surgery regarding  additional wound management.  Hospital Course:  Acute problems: PAF maintaining sinus rhythm w/persistent tachycardia -Continue metoprolol to 37.5 mg BID-this will be continued upon discharge -Echocardiogram this admission with an EF 50 to 55% with mild  diastolic dysfunction parameters and normal RV systolic function -Continue Eliquis-benefits coordinator has determined that Eliquis is covered at [facility] in [place] with a co-pay of $47  Acute respiratory failure secondary to COVID-pneumonia/tracheostomy/acute MSSA tracheobronchitis -Patient stable on RA -2/16 decannulated has remained stable with prior insertion site healing well -Completed IV Ancef for MSSA tracheobronchitis  Acute kidney injury secondary to COVID-related sepsis with shock superimposed on stage III chronic kidney disease -On 2/21 decision made to no longer pursue dialysis and tunneled catheter discontinued in IR -Avoid nephrotoxic medications -Despite normal renal function which is influenced by low muscle mass nephrology recommends continuing a renal-based diet given low-grade hyperkalemia and ongoing hypercalcemia  Lower extremity muscle pain -Continue Voltaren gel.  Patient clarifies this is not a cramping intermittent pain but a constant pain.  This will be continued at discharge  Anxiety -Continue as needed Vistaril after discharge  Dysphagia/moderate to severe protein calorie malnutrition Nutrition Status: Nutrition Problem: Increased nutrient needs Etiology: wound healing Signs/Symptoms: estimated needs Interventions: Refer to RD note for recommendations  Estimated body mass index is 31.04 kg/m as calculated from the following:   Height as of this encounter: _0  (1.727 m).   Weight as of this encounter: 92.6 kg.  -Continue Megace -I have changed from Nepro to Ensure Enlive but since patient is to remain on renal diet may need to consider changing this back to Nepro -Unable to unclog core track tube.  Discussed at length with daughter and given that patient otherwise will be ready for discharge home later in the week plan is to discontinue cortrack tube and allow for oral feedings only with supplemental protein shakes.  Daughter aware to touch base with  patient's primary care provider if her mother continues to have issues with inadequate oral intake.  Patient may eventually require PEG tube -Juven and Prosource discontinued since not intended to be taken orally-patient encouraged to drink oral protein shake  Acute hemorrhagic shock secondary to perforated duodenal ulcer/anemia of critical illness (initial reason for admission) -Resolved -Hemoglobin remains stable greater than 8.0  Diabetes mellitus 2 -Patient has been transitioned to regular diet to facilitate oral intake -CBGs increasing as PO intake increase-continue Levemir 5 units BID -HgbA1c 08/24/2020 and was 7.0 -Prior to admission patient was on metformin XR-unable to use at this juncture due to severity of kidney disease -we are hopeful for recovery of renal function  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 -SNF recommended but family wants to take patient home -Patient continues to make small but impressive improvements in mobility daily -Patient now has a power wheelchair which she has been able to utilize as of last week-does require Hoyer lift to transfer-doing well with power wheelchair -Family request no SNF rehab due to pt prior request to NEVER be placed in any type of facility -PT and OT working on an alternating schedule with one of the other therapy would see the patient daily Monday through Friday prior to hydrotherapy -Patient will not have hydrotherapy after discharge and will have a different VAC system in place.  Patient will need to follow-up with general surgery for wound management after discharge.  Stage IV sacral decubitus -Not present on admission -Wound care nurse following-continue now has wound VAC -wound has improved significantly and apparently PT is  now recommending discontinuation of hydrotherapy -Surgical team consulted.  Current recommendation is against pursuing general anesthesia to undergo extensive surgical  procedure-hopefully can be reevaluated in the future if wound does not heal adequately.  As of 1/28 they have nothing further acutely to add but will be following peripherally.  Patient will need to follow-up with the surgical team for outpatient wound management.  Plan is to continue [facility] after discharge. -Continue Kreg rotational bed while inpatient but will transition to hospital bed with mattress overlay/air mattress at discharge -Continue specialty motorized wheelchair -Offered to keep patient in the hospital for a longer period of time to allow for continued wound care but daughter stated she preferred to go ahead and have the patient discharged to home where family can manage her care -Follow-up with general surgery after discharge  Patient has been seen by physical therapist Wells Guiles Med[place]p, PT, DPT) and seating specialist ([place], [facility], [place]) to determine mobility equipment needs. I have personally assessed the patient and agree with the recommendations that the therapist and seating specialist have documented. She has trialed a similar power wheelchair while in the hospital and has shown she can operate it independently.    -  11/24/2020 after [facility]      11/22/2020                       11/29/2020       12/06/2020                             12/13/2020                  12/20/20       Incision (Closed) 09/01/20 Abdomen (Active)  Date First Assessed/Time First Assessed: 09/01/20 1737   Location: Abdomen    Assessments 09/01/2020  6:25 PM 12/20/2020  8:31 PM  Dressing Type Gauze (Comment) Abdominal pads  Dressing - Clean;Dry;Intact  Dressing Change Frequency - Daily  Site / Wound Assessment Dressing in place / Unable to assess Dressing in place / Unable to assess  Drainage Amount None Scant     No Linked orders to display     Pressure Injury 09/17/20 Ear Left;Anterior;Posterior Stage 2 -  Partial thickness loss of dermis presenting as a shallow open injury with a red, pink  wound bed without slough. (Active)  Date First Assessed/Time First Assessed: 09/17/20 0800   Location: Ear  Location Orientation: Left;Anterior;Posterior  Staging: Stage 2 -  Partial thickness loss of dermis presenting as a shallow open injury with a red, pink wound bed without slough. ...    Assessments 09/17/2020  8:00 AM 12/05/2020  8:00 PM  Dressing Type Foam - Lift dressing to assess site every shift None  Dressing Clean;Dry;Intact -  Dressing Change Frequency Every 3 days -  Site / Wound Assessment Dry;Pink -  Peri-wound Assessment Intact -  Wound Length (cm) 2 cm -  Wound Width (cm) 1 cm -  Wound Depth (cm) 0.25 cm -  Wound Surface Area (cm^2) 2 cm^2 -  Wound Volume (cm^3) 0.5 cm^3 -  Margins Unattached edges (unapproximated) -  Drainage Amount Scant -  Drainage Description Serosanguineous -  Treatment Cleansed -     No Linked orders to display     Pressure Injury 09/25/20 Sacrum Bilateral;Medial Deep Tissue Pressure Injury - Purple or maroon localized area of discolored intact skin or blood-filled blister due to damage of underlying  soft tissue from pressure and/or shear. Purple, non-blanchable, b (Active)  Date First Assessed/Time First Assessed: 09/25/20 2000   Location: Sacrum  Location Orientation: Bilateral;Medial  Staging: Deep Tissue Pressure Injury - Purple or maroon localized area of discolored intact skin or blood-filled blister due to damage o...    Assessments 09/25/2020  5:00 PM 12/20/2020  8:31 PM  Dressing Type Foam - Lift dressing to assess site every shift Barrier Film (skin prep);Negative pressure wound therapy  Dressing Changed;Clean;Dry;Intact Clean;Dry;Intact  State of Healing - Early/partial granulation  Site / Wound Assessment - Dressing in place / Unable to assess  Drainage Amount - Minimal  Drainage Description - Serous  Treatment - Debridement (Selective);Negative pressure wound therapy     No Linked orders to display     Negative Pressure Wound  Therapy Sacrum (Active)  Placement Date/Time: 11/22/20 1500   Wound Type: (c) Other (Comment)  Location: Sacrum    Assessments 11/22/2020  4:59 PM 12/20/2020  8:31 PM  Last dressing change 11/22/20 -  Site / Wound Assessment Granulation tissue;Pale;Yellow Clean;Dry  Peri-wound Assessment Intact Intact  Size see above -  Wound filler - Black foam 2 -  Wound filler - White foam 0 -  Wound filler - Nonadherent 0 -  Wound filler - Gauze 0 -  Cycle Continuous Continuous  Target Pressure (mmHg) 125 125  Instillation Volume 26 mL -  Instillation Solution Normal Saline Normal Saline  Instillation Soak Time 10 minutes -  Instillation Therapy Time 3.5 hours -  Canister Changed No Yes  Machine plugged into wall outlet (NOT bed outlet) - Yes  Dressing Status Intact Intact  Drainage Amount None -  Output (mL) - 50 mL     No Linked orders to display     Other problems: Coag neg staph-1/2 blood cultures positive -dw ID pharmacist- cultures c/w contamination Vancomycin ordered overnight stopped on 2/8  Acute encephalopathy secondary to prolonged hospital stay -Resolved  History of stage IV colon cancer -Old colostomy  Acute encephalopathy with ICU delirium -Delirium has resolved and supportive meds of Klonopin and Seroquel have been weaned and discontinued  Hypomagnesemia -Continue oral magnesium -1/21 give 4 g magnesium IV x1 -Follow labs  DVT bilateral posterior tibial veins -Was not present during initial COVID admission -Continue Eliquis for now given severe debility and increased risk for developing recurrent DVT  Hypertension -Having issues with orthostasis requiring midodrine Prior to admission patient was on Norvasc  Dyslipidemia -Prior to admission patient was on Crestor-consider resuming soon once patient can swallow pills whole  Abnormal TSH -Nov 2021 TSH was 0.015-TSH and free T4, T3 normal this admission   Right ear pain/possible impacted  cerumen -Evaluated by attending physician over the weekend and was noted to have significant amount of cerumen in ear.  -Some wax has been spontaneously expunged from ear after utilization of Debrox  Procedures: R PICC 11/5 >> A line 11/9 >> out ETT 11/9 > 11/16, 11/16 >> 09/21/2020, 09/21/2020 tracheostomy>> Lt [place] CVL 11/9 >> R IJ trialysis >> out HD catheter 12/1 >>12/20 12/21 - 14.5 Fr, 23 cm right IJ tunneled hemodialysis catheter placement. Removal of indwelling subclavian catheter.   Consultations:  Cardiology  Surgery  Nephrology  Ethics  PCCM    Discharge Exam: Vitals:   12/23/20 0441 12/23/20 0751  BP: 122/79 131/83  Pulse: 84 95  Resp: 20 15  Temp: 97.9 F (36.6 C) 98.7 F (37.1 C)  SpO2: 100% 99%   Constitutional: Awake and oriented in  no acute distress Respiratory: Lungs clear to auscultation she is stable on room air, trach site unremarkable.  Dressing removed from previous tracheostomy site.  Site has closed with pink granular tissue in the base.  No indication to continue dressing. Cardiovascular: Heart sounds are normal, pulses regular, extremities warm to touch without any peripheral edema Abdomen: Soft nontender nondistended.  Bowel sounds present LBM 2/20-Regular diet and thin liquids-patient typically chews up food to get moisture out and then spits out.  Tolerates liquid protein shakes and other calorie laden fluids better. Skin:  Massive sacral decubitus ulcer-wound VAC with serosanguineous drainage noted in collection system Neurologic: CN 2-12 grossly intact. Sensation intact, DTR normal. Strength 1/5 x all 4 extremities.  Psychiatric: Alert and oriented x3, appropriate mood  Discharge Instructions   Discharge Instructions    Amb referral to AFIB Clinic   Complete by: As directed      Allergies as of 12/23/2020   No Known Allergies     Medication List    STOP taking these medications   albuterol 108 (90 Base) MCG/ACT  inhaler Commonly known as: VENTOLIN HFA   amLODipine 10 MG tablet Commonly known as: NORVASC   levofloxacin 250 MG tablet Commonly known as: LEVAQUIN   metFORMIN 500 MG 24 hr tablet Commonly known as: GLUCOPHAGE-XR   rosuvastatin 40 MG tablet Commonly known as: CRESTOR   zinc gluconate 50 MG tablet     TAKE these medications   acetaminophen 500 MG tablet Commonly known as: TYLENOL Take 1,000 mg by mouth every 6 (six) hours as needed for mild pain.   apixaban 2.5 MG Tabs tablet Commonly known as: ELIQUIS Take 1 tablet (2.5 mg total) by mouth 2 (two) times daily.   aspirin 81 MG EC tablet Take 81 mg by mouth daily.   blood glucose meter kit and supplies Kit Dispense based on patient and insurance preference. Use up to four times daily as directed. (FOR ICD-9 250.00, 250.01).   collagenase ointment Commonly known as: SANTYL Apply topically 2 (two) times daily.   diclofenac Sodium 1 % Gel Commonly known as: VOLTAREN Apply 2 g topically 4 (four) times daily.   feeding supplement Liqd Take 237 mLs by mouth 3 (three) times daily between meals.   Gerhardt's butt cream Crea Apply 1 application topically as needed for irritation.   hydrOXYzine 10 MG tablet Commonly known as: ATARAX/VISTARIL Take 1 tablet (10 mg total) by mouth 3 (three) times daily as needed for anxiety.   insulin glargine 100 UNIT/ML Solostar Pen Commonly known as: LANTUS Inject 12 Units into the skin daily.   megestrol 400 MG/10ML suspension Commonly known as: MEGACE Take 5 mLs (200 mg total) by mouth 4 (four) times daily.   Metoprolol Tartrate 37.5 MG Tabs Take 37.5 mg by mouth 2 (two) times daily. Take 1.5 tabs per dose   oxyCODONE 5 MG immediate release tablet Commonly known as: Oxy IR/ROXICODONE Take 1-2 tablets (5-10 mg total) by mouth every 4 (four) hours as needed for moderate pain.   pantoprazole 40 MG tablet Commonly known as: PROTONIX Take 1 tablet (40 mg total) by mouth daily.             Durable Medical Equipment  (From admission, onward)         Start     Ordered   12/22/20 1025  For home use only DME Overbed table  Once        12/22/20 1025   12/21/20 1045  DME Glucometer  Once       Comments: Glucometer and supplies of choice   12/21/20 1044   12/20/20 1054  For home use only DME Negative pressure wound device  Once       Comments: Amount of suction - 150 mm/Hg  Question Answer Comment  Frequency of dressing change 3 times per week   Length of need 12 Months   Dressing type Foam   Amount of suction Other(see comments)   Pressure application Intermittent pressure   Supplies 10 canisters and 15 dressings per month for duration of therapy      12/20/20 1055   12/20/20 1050  For home use only DME Hospital bed  Once       Question Answer Comment  Length of Need 12 Months   Patient has (list medical condition): Sacral wound with wound vac, Hx trach decannulation, Hx ESRD, Hx COVID pneumonia, Atrial fibrillation, Colon cancer and S/P colostomy placement   The above medical condition requires: Patient requires the ability to reposition frequently   Head must be elevated greater than: 30 degrees   Bed type Semi-electric   Hoyer Lift Yes   Trapeze Bar Yes   Support Surface: Low Air loss Mattress      12/20/20 1052         No Known Allergies  Follow-up Information    [facility] [place] Follow up.   Specialty: Home Health Services Contact information: [place] [address] [phone]        Jesusita Oka, MD. Call on 01/25/2021.   Specialty: Surgery Why: You have an appointment with Dr. Bobbye Morton at 2:20 PM.  The office asked that you arrive at 1:50 PM.  They will also be mailing you paperwork to be completed prior to arrival. Contact information: [place] [address] [phone]        [facility] Follow up.   Why: [employer] delivered a loaner  electric wheelchair and will follow up regarding ordered wheelchair. They will follow up and will prepare to deliver a specialty bed to the home prior to discharge to home. Contact information: [address] [address] [phone]        [facility] therapy Follow up.   Why: [employer] VAC therapy company will be delivering a home wound vac to your hospital room prior to discharge to home.       Algis Greenhouse, MD. Schedule an appointment as soon as possible for a visit in 2 week(s).   Specialty: Family Medicine Contact information: [address] [place] [address] [phone]                The results of significant diagnostics from this hospitalization (including imaging, microbiology, ancillary and laboratory) are listed below for reference.    Significant Diagnostic Studies: DG Chest 1 View  Result Date: 11/27/2020 CLINICAL DATA:  Atrial fibrillation. EXAM: CHEST  1 VIEW COMPARISON:  11/25/2020 FINDINGS: Tracheostomy tube in satisfactory position. Right jugular catheter tip in the superior vena cava. Feeding tube extending into the stomach. Normal sized heart. No significant change in bibasilar airspace opacity and linear densities. Unremarkable bones. IMPRESSION: No significant change in bibasilar atelectasis and pneumonia/scarring. Electronically Signed   By: Claudie Revering M.D.   On: 11/27/2020 10:42   IR Fluoro Guide CV Line Right  Result Date: 11/29/2020 INDICATION: 70 year old female with history of end-stage renal disease on hemodialysis status post  tunneled hemodialysis catheter placement on 10/19/2020, presenting with malfunctioning catheter, exposed cuff. EXAM: TUNNELED CENTRAL VENOUS HEMODIALYSIS CATHETER REPLACEMENT WITH FLUOROSCOPIC GUIDANCE MEDICATIONS: None. FLUOROSCOPY TIME:  0 minutes 7 seconds, 4 mGy COMPLICATIONS: None immediate. PROCEDURE: Informed written consent was obtained from the patient after a discussion of the risks, benefits, and  alternatives to treatment. Questions regarding the procedure were encouraged and answered. The skin and external portion of the existing hemodialysis catheter was prepped with chlorhexidine in a sterile fashion, and a sterile drape was applied covering the operative field. Maximum barrier sterile technique with sterile gowns and gloves were used for the procedure. A timeout was performed prior to the initiation of the procedure. Both lumens of the hemodialysis catheter were cannulated with a stiff Glidewire and advanced to the level of the right atrium. Under intermittent fluoroscopic guidance, the existing dialysis catheter was exchanged for a new 23 cm tip to cuff palindrome dialysis catheter with tips ultimately terminating within the right atrium. Final catheter positioning was confirmed and documented with a spot radiographic image. The catheter aspirates and flushes normally. The catheter was flushed with appropriate volume heparin dwells. The catheter exit site was secured with 2, 0 silk retention sutures. Both lumens were heparinized. A dressing was placed. The patient tolerated the procedure well without immediate post procedural complication. IMPRESSION: Successful replacement of 23 cm tip to cuff tunneled hemodialysis catheter with tips terminating within the right atrium. The catheter is ready for immediate use. Ruthann Cancer, MD [facility] Radiology Electronically Signed   By: Ruthann Cancer MD   On: 11/29/2020 11:59   IR Removal Tun Cv Cath W/O FL  Result Date: 12/20/2020 INDICATION: Patient with history of AKI in need of HD s/p right IJ tunneled HD catheter placed in IR 10/19/2020; most recently exchange in IR 11/29/2020. Patient now in renal recovery. Request is made for removal of tunneled HD catheter. EXAM: REMOVAL OF TUNNELED HEMODIALYSIS CATHETER MEDICATIONS: None COMPLICATIONS: None immediate. PROCEDURE: Informed written consent was obtained  from the patient's daughter following an explanation of the procedure, risks, benefits and alternatives to treatment. A time out was performed prior to the initiation of the procedure. Maximal barrier sterile technique was utilized including caps, mask, sterile gowns, sterile gloves, large sterile drape, hand hygiene, and ChloraPrep. Utilizing blunt dissection, the catheter was removed intact. Hemostasis was obtained with manual compression. A dressing was placed. The patient tolerated the procedure well without immediate post procedural complication. IMPRESSION: Successful removal of tunneled dialysis catheter. Read by: Earley Abide, PA-C Electronically Signed   By: Ruthann Cancer MD   On: 12/20/2020 11:29   DG Chest Port 1 View  Result Date: 12/08/2020 CLINICAL DATA:  Abnormal respiration. EXAM: PORTABLE CHEST 1 VIEW COMPARISON:  November 27, 2020. FINDINGS: Stable cardiomegaly. No pneumothorax is noted. Tracheostomy and feeding tubes are unchanged in position. Right internal jugular catheter is noted with tip now noted in right atrium. Right-sided PICC line is unchanged. Stable bibasilar atelectasis or infiltrates are noted. Bony thorax is unremarkable. IMPRESSION: Stable support apparatus. Right internal jugular catheter is noted with tip now noted in right atrium. Stable bibasilar atelectasis or infiltrates. Electronically Signed   By: Marijo Conception M.D.   On: 12/08/2020 08:14   DG CHEST PORT 1 VIEW  Result Date: 11/25/2020 CLINICAL DATA:  70 year old female with respiratory failure. COVID-19. EXAM: PORTABLE CHEST 1 VIEW COMPARISON:  Portable chest 11/13/2020 and earlier. FINDINGS: Portable AP semi upright view at 0345 hours. Satisfactory tracheostomy  tube. Stable dual lumen right chest catheter. Superimposed right PICC line is stable. Enteric feeding tube has been removed. Stable low lung volumes with coarse and patchy lower lung opacity. Stable cardiac size and mediastinal contours. No pneumothorax,  pulmonary edema or pleural effusion. Ventilation has mildly improved at both lung bases. Paucity of bowel gas in the upper abdomen. Stable visualized osseous structures. IMPRESSION: 1. Enteric feeding tube removed. Otherwise, stable lines and tubes. 2. Sequelae of COVID-19 with mildly improved ventilation in both lower lungs since 11/13/2020. Electronically Signed   By: Genevie Ann M.D.   On: 11/25/2020 05:57    Microbiology: No results found for this or any previous visit (from the past 240 hour(s)).   Labs: Basic Metabolic Panel: Recent Labs  Lab 12/19/20 0957 12/20/20 0225 12/21/20 0500 12/22/20 0236 12/23/20 0655  NA 134* 136 137 138 135  K 4.7 5.1 5.1 5.3* 4.9  CL 108 110 112* 113* 110  CO2 19* 20* 19* 17* 18*  GLUCOSE 199* 161* 120* 203* 117*  BUN 40* 44* 28* 26* 18  CREATININE 0.61 0.70 0.59 0.64 0.56  CALCIUM 11.4* 11.4* 11.1* 11.4* 11.4*  PHOS 3.2 3.1 2.9 3.0 2.7   Liver Function Tests: Recent Labs  Lab 12/19/20 0957 12/20/20 0225 12/21/20 0500 12/22/20 0236 12/23/20 0655  ALBUMIN 2.4* 2.3* 2.2* 2.1* 2.1*   No results for input(s): LIPASE, AMYLASE in the last 168 hours. No results for input(s): AMMONIA in the last 168 hours. CBC: No results for input(s): WBC, NEUTROABS, HGB, HCT, MCV, PLT in the last 168 hours. Cardiac Enzymes: No results for input(s): CKTOTAL, CKMB, CKMBINDEX, TROPONINI in the last 168 hours. BNP: BNP (last 3 results) Recent Labs    09/07/20 0118 09/08/20 0446 09/09/20 0428  BNP 216.8* 432.5* 609.7*    ProBNP (last 3 results) No results for input(s): PROBNP in the last 8760 hours.  CBG: Recent Labs  Lab 12/22/20 1632 12/22/20 2033 12/23/20 0016 12/23/20 0439 12/23/20 0751  GLUCAP 151* 246* 257* 141* 88       Signed:  [name] ANP [facility] 12/23/2020, 9:57 AM   Addendum: Agree with above.  Patient being discharged home today.

## 2020-12-22 NOTE — Consult Note (Signed)
[facility] Nurse Consult Note: Patient receiving care in [place].  Assisted with positioning for regular NPWT placement to sacral wound by C. Owens Shark, PT. Reason for Consult: change over from cleanse VAC to regular VAC. Wound type: stage 4 PI Pressure Injury POA: No Measurement: Please see PT note and measurements for today. Wound bed: Drainage (amount, consistency, odor)  Periwound: Dressing procedure/placement/frequency: A total of 4 pieces of black foam were cut to fit into the wound bed. The dressing was bridged to the right hip.  Immediate seal obtained. Patient tolerated very well.  I plugged in the home VAC to charge, and placed a cannister in the device.  The primary RN just needs to unhook the Trac pad from our cannister, and attach it to the home [facility] tomorrow and turn it on. Val Riles, RN, MSN, CWOCN, CNS-BC, pager [phone]

## 2020-12-22 NOTE — Progress Notes (Signed)
Occupational Therapy Treatment Patient Details Name: Candace [name] MRN: [id number] DOB: 07/07/1951 Today's Date: 12/22/2020    History of present illness Pt 70 y.o. female with medical history significant for recent covid pna, obesity, colon cancer s/p colon resection with colostomy bag, HLD, NIDDM2, CKD3 presented to ED after her follow-up nurse advised her to present to ED for elevated HR. +afib, elevated troponins with demand ischemia,  CT scan is positive for bowel perforation with pneumomediastinum 11/03 exp lap with repair of perforated ulcer. 11/10 t/f to ICU- intubated/sedated/proned. Trach placed 11/24. CRRT off 12/8, restarted 12/10-1/5. 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  Decannulated 12/15/20.  Coretrack feeding tube taken out 12/20/20.   OT comments  Pt with gradual progression towards OT goals. Pt noted with tightness and minimal scapular motion impacting shoulder ROM. Session focused on scapular mobilization, educated daughter on these exercises when she arrived. Pt also with ROM deficits of B hands with composite flexion, applied heat and stretching to decrease tightness, prevent contracture formations. Discussed recommends for ADL completions bed level (family obtaining purewick system for home), trialing of various positioning for pressure relief but promotion of upright posture when pain tolerable. Pt opted for trialing chair position in bed for breakfast, rather than sitting up in power chair this AM. Pt able to tolerate 17* for chair position before reporting too much pain. Daughter will trial further HOB elevation once breakfast arrives.    Follow Up Recommendations  LTACH;SNF;Supervision/Assistance - 24 hour;Other (comment) (family declining postacute rehab. [place] services needed at DC, HHOT/Aide/PT/RN)    Financial risk analyst (measurements OT);Wheelchair cushion  (measurements OT);Hospital bed;Other (comment) (tilt in space chair, air mattress overlay, hoyer lift)    Recommendations for Other Services      Precautions / Restrictions Precautions Precautions: Fall Precaution Comments: baseline R colostomy; sacral wound with wound vac Required Braces or Orthoses: Other Brace Other Brace: B prafos Restrictions Weight Bearing Restrictions: No       Mobility Bed Mobility Overal bed mobility: Needs Assistance Bed Mobility: Rolling Rolling: Mod assist         General bed mobility comments: Mod A for rolling side to side for exercises and repositioning    Transfers                 General transfer comment: pt declined to transfer from bed to power chair at this time    Balance                                           ADL either performed or assessed with clinical judgement   ADL Overall ADL's : Needs assistance/impaired                                       General ADL Comments: Discussed with pt and then daughter who arrived later regarding ADLs. Encouraged pt to continue attempting self feeding tasks without assist, eating finger foods to ease task but encouraged continued trial with utensils to maximize coordination. Discussed with pt that dressing/bathing tasks safest at bed level. Daughter reports they plan to get purewick system to assist for urination tasks (has colostomy bag). Encouraged frequent trial for repositioning. Use of chair position if  unable to tolerate up in power chair as often     Vision   Vision Assessment?: No apparent visual deficits   Perception     Praxis      Cognition Arousal/Alertness: Awake/alert Behavior During Therapy: WFL for tasks assessed/performed Overall Cognitive Status: Impaired/Different from baseline Area of Impairment: Problem solving                             Problem Solving: Slow processing General Comments: Pt able to recall  accurately when pain meds given by RN, etc when daughter asked on entry        Exercises Exercises: Other exercises Hand Exercises Digit Composite Flexion: AROM;Both;10 reps;PROM Composite Extension: Both;10 reps Other Exercises Other Exercises: PNF pattern bil UE x 10 each Other Exercises: Scapular Protraction; PROM; x10 Other Exercises: Scapular elevation PROM/AROM x 10 B UE   Shoulder Instructions       General Comments Daughter entering at end of session and involved in education on composite flexion stretching, use of heat, massage, and scapular mobilization exercises    Pertinent Vitals/ Pain       Pain Assessment: Faces Faces Pain Scale: No hurt Pain Location: bottom Pain Intervention(s): Other (comment) (no pain when sidelying, increased pain with attempt at chair position at end of session)  Home Living                                          Prior Functioning/Environment              Frequency  Min 2X/week        Progress Toward Goals  OT Goals(current goals can now be found in the care plan section)  Progress towards OT goals: Progressing toward goals  Acute Rehab OT Goals Patient Stated Goal: go home this week! OT Goal Formulation: With patient/family Time For Goal Achievement: 01/05/21 Potential to Achieve Goals: Fair ADL Goals Pt Will Perform Grooming: with mod assist;bed level;sitting Pt Will Perform Upper Body Bathing: with max assist;sitting;bed level Pt Will Perform Upper Body Dressing: with set-up;with supervision;sitting Pt Will Transfer to Toilet: with max assist;with +2 assist;squat pivot transfer;bedside commode Pt Will Perform Toileting - Clothing Manipulation and hygiene: with min guard assist;sitting/lateral leans;sit to/from stand Pt Will Perform Tub/Shower Transfer: with supervision;ambulating;shower seat;rolling walker Pt/caregiver will Perform Home Exercise Program: Increased strength;Both right and left upper  extremity;With written HEP provided;With theraband Additional ADL Goal #1: Pt will sustain attention to ADL task with min VCs Additional ADL Goal #2: Pt will sit EOB for 5 mins with mod A for trunk support with VSS Additional ADL Goal #3: Pt will complete bed mobility with mod A as precursor to ADLs  Plan Discharge plan remains appropriate;Frequency remains appropriate    Co-evaluation                 AM-PAC OT "6 Clicks" Daily Activity     Outcome Measure   Help from another person eating meals?: A Little Help from another person taking care of personal grooming?: A Little Help from another person toileting, which includes using toliet, bedpan, or urinal?: Total Help from another person bathing (including washing, rinsing, drying)?: A Lot Help from another person to put on and taking off regular upper body clothing?: A Lot Help from another person to put on and taking off regular lower  body clothing?: Total 6 Click Score: 12    End of Session    OT Visit Diagnosis: Unsteadiness on feet (R26.81);Other abnormalities of gait and mobility (R26.89);Muscle weakness (generalized) (M62.81);Other symptoms and signs involving cognitive function   Activity Tolerance Patient limited by pain   Patient Left in bed;with call bell/phone within reach;with family/visitor present;with nursing/sitter in room   Nurse Communication Mobility status        Time: 2583-4621 OT Time Calculation (min): 60 min  Charges: OT General Charges $OT Visit: 1 Visit OT Treatments $Self Care/Home Management : 8-22 mins $Therapeutic Activity: 8-22 mins $Therapeutic Exercise: 23-37 mins  Malachy Chamber, OTR/L Acute Rehab Services Office: [facility] 12/22/2020, 9:42 AM

## 2020-12-22 NOTE — Progress Notes (Signed)
Rounded on patient and emptied colostomy. Pt sleeping. Denies pain or discomfort. Pt refuses to be turned and repositioned at this time. Daughter remains at bedside.

## 2020-12-22 NOTE — Care Management (Cosign Needed)
    Durable Medical Equipment  (From admission, onward)         Start     Ordered   12/22/20 1025  For home use only DME Overbed table  Once        12/22/20 1025   12/21/20 1045  DME Glucometer  Once       Comments: Glucometer and supplies of choice   12/21/20 1044   12/20/20 1054  For home use only DME Negative pressure wound device  Once       Comments: Amount of suction - 150 mm/Hg  Question Answer Comment  Frequency of dressing change 3 times per week   Length of need 12 Months   Dressing type Foam   Amount of suction Other(see comments)   Pressure application Intermittent pressure   Supplies 10 canisters and 15 dressings per month for duration of therapy      12/20/20 1055   12/20/20 1050  For home use only DME Hospital bed  Once       Question Answer Comment  Length of Need 12 Months   Patient has (list medical condition): Sacral wound with wound vac, Hx trach decannulation, Hx ESRD, Hx COVID pneumonia, Atrial fibrillation, Colon cancer and S/P colostomy placement   The above medical condition requires: Patient requires the ability to reposition frequently   Head must be elevated greater than: 30 degrees   Bed type Semi-electric   [employer] Yes   Trapeze Bar Yes   Support Surface: Low Air loss Mattress      12/20/20 1052

## 2020-12-22 NOTE — Progress Notes (Deleted)
12/22/2020:   Patient has been seen by physical therapist Wells Guiles Yeilin Zweber, PT, DPT) and seating specialist ([employer] [employer], [place]) to determine mobility equipment needs. I have personally assessed the patient and agree with the recommendations that the therapist and seating specialist have documented. She has trialed a similar power wheelchair while in the hospital and has shown she can operate it independently.

## 2020-12-22 NOTE — Progress Notes (Signed)
Physical Therapy Wound Treatment and Discharge Patient Details  Name: Candace [name] MRN: [id number] Date of Birth: Mar 03, 1951  Today's Date: 12/22/2020    Subjective  Subjective: Pt eager to go home Patient and Family Stated Goals: heal wound Prior Treatments: prior hydro and Dakins  Pain Score:  4/10  Wound Assessment  Pressure Injury 09/25/20 Sacrum Bilateral;Medial Deep Tissue Pressure Injury - Purple or maroon localized area of discolored intact skin or blood-filled blister due to damage of underlying soft tissue from pressure and/or shear. Purple, non-blanchable, b (Active)  Wound Image   12/22/20 1606  Dressing Type Negative pressure wound therapy 12/22/20 1606  Dressing Changed;Clean;Dry;Intact 12/22/20 1606  Dressing Change Frequency Monday, Wednesday, Friday 12/22/20 1606  State of Healing Early/partial granulation 12/22/20 1606  Site / Wound Assessment Red;Yellow;Pink 12/22/20 1606  % Wound base Red or Granulating 65% 12/17/20 1359  % Wound base Yellow/Fibrinous Exudate 25% 12/17/20 1359  % Wound base Black/Eschar 0% 12/17/20 1359  % Wound base Other/Granulation Tissue (Comment) 10% 12/17/20 1359  Peri-wound Assessment Intact 12/17/20 1359  Wound Length (cm) 13 cm 12/22/20 1606  Wound Width (cm) 8 cm 12/22/20 1606  Wound Depth (cm) 5 cm 12/22/20 1606  Wound Surface Area (cm^2) 104 cm^2 12/22/20 1606  Wound Volume (cm^3) 520 cm^3 12/22/20 1606  Tunneling (cm) 0 12/01/20 1722  Undermining (cm) 0 12/01/20 1722  Margins Unattached edges (unapproximated) 12/20/20 1658  Drainage Amount Minimal 12/20/20 2031  Drainage Description Serous 12/20/20 2031  Treatment Negative pressure wound therapy 12/22/20 1606         Wound Assessment and Plan  Wound Therapy - Assess/Plan/Recommendations Wound Therapy - Clinical Statement: Pt transitioned to regular NPWT. No further hydrotherapy needs.  [name], PT, DPT Acute Rehabilitation Services Pager [phone] Office  [phone]      [name] 12/22/2020, 4:08 PM

## 2020-12-22 NOTE — Progress Notes (Signed)
Assessment completed.Patient remains on special mattress. Wound Vac intact. Patient denies pain or discomfort at this time.

## 2020-12-22 NOTE — Progress Notes (Signed)
Rounded on patient. Daughter fixing patients hair. Asked if patient needed anything including pain medication. Daughter and patient declined.

## 2020-12-22 NOTE — Progress Notes (Signed)
Medicated patient for wound vac change. Received call from wound care/ Home wound vac applied/

## 2020-12-22 NOTE — Progress Notes (Signed)
[facility] PROGRESS NOTE    Candace [name] GLO:[id number] DOB: 1950/12/24 DOA: 08/31/2020 PCP: Algis Greenhouse, MD           Status: Remains inpatient appropriate because:Inpatient level of care appropriate due to severity of illness   Dispo: The patient is from: Home              Anticipated d/c is to: Home with home health -              Anticipated d/c date is: 1 day              Patient currently is medically stable to d/c.  Barriers to discharge: DME to be delivered to patient's home on 2/23 with anticipated date of discharge 2/24   Code Status: Full Family Communication: 2/23 daughter [name] at bedside DVT prophylaxis: Eliquis Vaccination status: Has not been vaccinated but did have severe COVID infection initially diagnosed on 08/23/2020.  Patient eligible for COVID-vaccine after 11/23/2020.  On 12/22/2020 discussed with patient's daughter regarding Covid vaccine.  Due to side effects from all vaccines daughter would like to wait until her mother is more fully recovered before pursuing vaccination   Foley catheter: No, purewick female urinary collection device-daughter has requested for use at home but does not appear to be covered by [employer] and appears to be cost prohibitive noting averaging around $800 per month must be obtained directly from the manufacturer   HPI: 70 year old female patient with prior history of diabetes, stage III chronic kidney disease, atrial fibrillation, hypertension, dyslipidemia and colon cancer that is post colectomy and colostomy.  Initially diagnosed with COVID on 08/23/20 she was admitted to the hospital due to dehydration, acute kidney injury and hypoxemic respiratory failure.  She was discharged home on 2 to 3 L of oxygen after being treated with IV steroids and remdesivir.  During that time although she had elevated inflammatory markers she had no embolic or thrombotic disease.  She had been on prophylactic dose heparin during  the hospitalization and was placed on Eliquis for 2 weeks for after discharge  Patient returned to the ER on 08/31/2020 for complaints of not feeling well.  She was found to be in atrial fibrillation with RVR, hemorrhagic shock and work-up revealed a perforated duodenal ulcer.  She underwent exploratory laparotomy on 11/3.  She has had an extensive ICU stay with significant events as below.   11/2 Admitted with rapid Afib 11/3 OR with findings of perforated duodenal ulcer 11/10 progressive hemorrhagic shock, intubated, transfused, pressors, proned; started on CRRT in PM 11/16 Extubated. Re-intubated overnight due to respiratory distress and hypoxia with decreased mentation 11/18 Bronch, cultures sent 11/19 Hgb down getting blood 11/24 Spiked fever, resume empirical antimicrobial therapy 11/26 Hemorrhagic shock, hgb 5.6, increased pressors,  11/30 Per palliative "[name] understanding that patient is unfortunately very fragiledespite ongoing intensive medical treatment and full mechanical support. [name] that the familywantsto continue with all current interventions despite potential outcomes". 12/08 CRRT discontinued due to clotting 12/09 Family requested transfer to tertiary care [facility]). [facility] denied transfer  12/10 CRRT restarted. Episodes of tachycardia, tachypnea that seem to improve with pain management 12/11 Back in shock. Pressor requirements up. CXR worse. ABX resumed 12/12 Still requiring inc pressors. Had hypoglycemic event. Basal insulin dosing adjusted 12/13 Pressor requirements better. Now hyperglycemic. Re-adjusted Glycemic control  12/19 Afebrile . Remains on dilaudid and heparin gtt, dilaudid gtt increased overnight for concern of pain / ongoing tachycardia, no other events . NE and  precedex off 12/17. Ongoing CRRT- even UF, dosing lokelmia this morning 12/20 On CRRT. Renal plans for HD cath and moving to HD. Getting wound care 12/24 - renal stopping CRRT today  and plans HD 10/24/20 . 40% fio2 on vent via Trach. TAchypenic and tachycardic. Afebrile but wbc up to 27.6K. On TF. On heparin gtt 12/25 - Back on CRRT. On vent via trach at 40% fio2, On scheduled dilaudid as add on to oxy. Per family request 12/24 - they felt scheduled oxy was not adequate and patient was showing signs of opioid withdrawal.  Patient also had worsening SIRS/sepsis syndrome. Had fever, rising wbc, worsening RR 40-60 and HR 140s sinus->started On abx yesterday. Fever some better today. WBC plateau at 28,.5K. On new levophed ->signifanct improvement in HR 77 and RR t0 20. On heparin gtt. On precedex gtt. On levophed gtt 64mg wthi midodrine. On TF 12/30 Remains on CRRT with intermittent pressor requirements. Ethics consult placed 12/29. Ethics rec time trial of CRRT 12/31 failed SBT with RR 40s. Several conversations between care tam and family, who are upset RE plan of care 1/1 back on pressors  1/4: On pressors, keeping even on CVVHD, HGB drop to 6.9, transfused 1 unit. Improving mental status 1/10 remains on low dose levophed 236m 1/11 off levo, attempting HD with UF for first time. Now tolerating intermittent HD. 1/19 patient transferred to progressive bed with tracheostomy on 35% FiO2.  has NG tube feeding.  No PEG tube.  Received dialysis on 1/18. 1/24 significant improvement and persistent tachycardia after introduction of twice daily beta-blocker.  Heart rates have improved from the 130s to the 98-104 range 1/24 core track clogged and therefore has been removed by nutrition team.  Calorie count in progress and we are weaning any sedating medications which could be contributing to patient's inability to eat. 1/27 continue w/ cuff #6 trach (eventually downsized to #4) 2/16 Decannulated 2/21 tunneled dialysis catheter and core track tube removed  Subjective: Patient awake, smiling and excited to be going home tomorrow.  No significant pain noted although she is beginning to  note low back pain now after she is getting up and moving more.  Daughter at bedside and updated on plans for discharge, [place] updated by bedside case management regarding anticipated delivery of DME today.  Daughter has been trained in how to check glucometer readings and administer insulin through a pen device.   Objective: Vitals:   12/22/20 0344 12/22/20 0800  BP: 120/65 108/64  Pulse: 92 95  Resp: 19 19  Temp: 97.8 F (36.6 C) 98.7 F (37.1 C)  SpO2: 99% 100%    Intake/Output Summary (Last 24 hours) at 12/22/2020 1032 Last data filed at 12/21/2020 2215 Gross per 24 hour  Intake 360 ml  Output 650 ml  Net -290 ml   Filed Weights    Exam: Constitutional: Awake, calm, no acute distress Respiratory: Stable on room air.  Anterior lung sounds clear Cardiovascular: Normal heart sounds without any rubs murmurs thrills or gallops, no peripheral edema, extremities warm to touch with appropriate capillary refill Abdomen: Abdomen soft nontender nondistended.  Colostomy in place. Skin:  Massive sacral decubitus ulcer-wound VAC with serosanguineous drainage noted in collection system-[facility] Neurologic: CN 2-12 grossly intact. Sensation intact, DTR normal. Strength 1/5 x all 4 extremities.  Psychiatric: Alert and oriented x3, appropriate mood   Assessment/Plan: Acute problems: PAF maintaining sinus rhythm  w/persistent tachycardia -Continue metoprolol to 37.5 mg BID-this will be continued upon discharge -Echocardiogram this admission with an EF 50 to 55% with mild diastolic dysfunction parameters and normal RV systolic function -Continue Eliquis-benefits coordinator has determined that Eliquis is covered at [facility] in [place] with a co-pay of $47  Acute respiratory failure secondary to COVID-pneumonia/tracheostomy/acute MSSA tracheobronchitis -Patient stable on low-flow oxygen FiO2  21% -2/16 decannulated has remained stable with prior insertion site healing well -Completed IV Ancef for MSSA tracheobronchitis  Right ear pain/possible impacted cerumen -Evaluated by attending physician over the weekend and was noted to have significant amount of cerumen in ear.  -Some wax has been spontaneously expunged from ear after utilization of Debrox  Acute kidney injury secondary to COVID-related sepsis with shock superimposed on stage III chronic kidney disease -On 2/21 decision made to no longer pursue dialysis and tunneled catheter discontinued in IR -Avoid nephrotoxic medications -Despite normal renal function which is influenced by low muscle mass nephrology recommends continuing a renal-based diet given low-grade hyperkalemia and ongoing hypercalcemia  Lower extremity muscle pain -Continue Voltaren gel.  Patient clarifies this is not a cramping intermittent pain but a constant pain.  This will be continued at discharge  Anxiety -Continue as needed Vistaril after discharge  Dysphagia/moderate to severe protein calorie malnutrition Nutrition Status: Nutrition Problem: Increased nutrient needs Etiology: wound healing Signs/Symptoms: estimated needs Interventions: Refer to RD note for recommendations  Estimated body mass index is 31.04 kg/m as calculated from the following:   Height as of this encounter: 5' 8"  (1.727 m).   Weight as of this encounter: 92.6 kg.  -Continue Megace -I have changed from Nepro to Ensure Enlive but since patient is to remain on renal diet may need to consider changing this back to Nepro -Unable to unclog core track tube.  Discussed at length with daughter and given that patient otherwise will be ready for discharge home later in the week plan is to discontinue core track tube and allow for oral feedings only with supplemental protein shakes.  Daughter aware to touch base with patient's primary care provider if her mother continues to have issues  with inadequate oral intake.   -Juven and Prosource discontinued since not intended to be taken orally-patient encouraged to drink oral protein shake -Discussed with daughter how to access renal site such as [employer] to obtain recipes for low potassium protein shakes and other appropriate renal diet food  Acute hemorrhagic shock secondary to perforated duodenal ulcer/anemia of critical illness (initial reason for admission) -Resolved -Hemoglobin remains stable greater than 8.0  Diabetes mellitus 2 -Patient has been transitioned to regular diet to facilitate oral intake -CBGs increasing as PO intake increase-continue Levemir 5 units BID -HgbA1c 08/24/2020 and was 7.0 -Prior to admission patient was on metformin XR-unable to use at this juncture due to severity of kidney disease -we are hopeful for recovery of renal function  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 -SNF recommended but family wants to take patient home -Patient continues to make small but impressive improvements in mobility daily -Patient now has a power wheelchair which she has been able to utilize as of last week-does require Hoyer lift to transfer-doing well with power wheelchair -Family request no SNF rehab due to pt prior request to NEVER be placed in any type of facility -PT and OT working on an alternating schedule with one of the other therapy would see the patient daily Monday through Friday prior to hydrotherapy -Patient will not have hydrotherapy after discharge  and will have a different VAC system in place.  Patient will need to follow-up with general surgery for wound management after discharge.  Stage IV sacral decubitus -Not present on admission -Wound care nurse following-continue now has wound VAC -wound has improved significantly and apparently PT is now recommending discontinuation of hydrotherapy -Surgical team consulted.  Current recommendation is against pursuing general  anesthesia to undergo extensive surgical procedure-hopefully can be reevaluated in the future if wound does not heal adequately.  As of 1/28 they have nothing further acutely to add but will be following peripherally.  Patient will need to follow-up with the surgical team for outpatient wound management.  Plan is to continue [facility] after discharge.  On 2/23 patient will be changed over to home [facility] system. -Continue Kreg rotational bed while inpatient but will transition to hospital bed with mattress overlay/air mattress at discharge -Continue specialty motorized wheelchair -Offered to keep patient in the hospital for a longer period of time to allow for continued wound care but daughter stated she preferred to go ahead and have the patient discharged to home where family can manage her care -Follow-up with general surgery after discharge  -  11/24/2020 after [facility]      11/22/2020                       11/29/2020     12/06/2020                             12/13/2020   Incision (Closed) 09/01/20 Abdomen (Active)  Date First Assessed/Time First Assessed: 09/01/20 1737   Location: Abdomen    Assessments 09/01/2020  6:25 PM 12/21/2020 10:15 PM  Dressing Type Gauze (Comment) Abdominal pads  Site / Wound Assessment Dressing in place / Unable to assess -  Drainage Amount None -     No Linked orders to display     Pressure Injury 09/17/20 Ear Left;Anterior;Posterior Stage 2 -  Partial thickness loss of dermis presenting as a shallow open injury with a red, pink wound bed without slough. (Active)  Date First Assessed/Time First Assessed: 09/17/20 0800   Location: Ear  Location Orientation: Left;Anterior;Posterior  Staging: Stage 2 -  Partial thickness loss of dermis presenting as a shallow open injury with a red, pink wound bed without slough. ...    Assessments 09/17/2020  8:00 AM 12/05/2020  8:00 PM  Dressing Type Foam - Lift dressing to assess site every shift None  Dressing Clean;Dry;Intact -  Dressing  Change Frequency Every 3 days -  Site / Wound Assessment Dry;Pink -  Peri-wound Assessment Intact -  Wound Length (cm) 2 cm -  Wound Width (cm) 1 cm -  Wound Depth (cm) 0.25 cm -  Wound Surface Area (cm^2) 2 cm^2 -  Wound Volume (cm^3) 0.5 cm^3 -  Margins Unattached edges (unapproximated) -  Drainage Amount Scant -  Drainage Description Serosanguineous -  Treatment Cleansed -     No Linked orders to display     Pressure Injury 09/25/20 Sacrum Bilateral;Medial Deep Tissue Pressure Injury - Purple or maroon localized area of discolored intact skin or blood-filled blister due to damage of underlying soft tissue from pressure and/or shear. Purple, non-blanchable, b (Active)  Date First Assessed/Time First Assessed: 09/25/20 2000   Location: Sacrum  Location Orientation: Bilateral;Medial  Staging: Deep Tissue Pressure Injury - Purple or maroon localized area of discolored intact skin or blood-filled blister due  to damage o...    Assessments 09/25/2020  5:00 PM 12/20/2020  8:31 PM  Dressing Type Foam - Lift dressing to assess site every shift Barrier Film (skin prep);Negative pressure wound therapy  Dressing Changed;Clean;Dry;Intact Clean;Dry;Intact  State of Healing - Early/partial granulation  Site / Wound Assessment - Dressing in place / Unable to assess  Drainage Amount - Minimal  Drainage Description - Serous  Treatment - Debridement (Selective);Negative pressure wound therapy     No Linked orders to display     Negative Pressure Wound Therapy Sacrum (Active)  Placement Date/Time: 11/22/20 1500   Wound Type: (c) Other (Comment)  Location: Sacrum    Assessments 11/22/2020  4:59 PM 12/22/2020  7:30 AM  Last dressing change 11/22/20 -  Site / Wound Assessment Granulation tissue;Pale;Yellow Clean;Dry  Peri-wound Assessment Intact -  Size see above -  Wound filler - Black foam 2 -  Wound filler - White foam 0 -  Wound filler - Nonadherent 0 -  Wound filler - Gauze 0 -  Cycle  Continuous -  Target Pressure (mmHg) 125 -  Instillation Volume 26 mL -  Instillation Solution Normal Saline -  Instillation Soak Time 10 minutes -  Instillation Therapy Time 3.5 hours -  Canister Changed No -  Dressing Status Intact Intact  Drainage Amount None -     No Linked orders to display     Other problems: Coag neg staph-1/2 blood cultures positive -dw ID pharmacist- cultures c/w contamination Vancomycin ordered overnight stopped on 2/8  Acute encephalopathy secondary to prolonged hospital stay -Resolved  History of stage IV colon cancer -Old colostomy  Acute encephalopathy with ICU delirium -Delirium has resolved and supportive meds of Klonopin and Seroquel have been weaned and discontinued  Hypomagnesemia -Continue oral magnesium -1/21 give 4 g magnesium IV x1 -Follow labs  DVT bilateral posterior tibial veins -Was not present during initial COVID admission -Continue Eliquis for now given severe debility and increased risk for developing recurrent DVT  Hypertension -Having issues with orthostasis requiring midodrine Prior to admission patient was on Norvasc  Dyslipidemia -Prior to admission patient was on Crestor-consider resuming soon once patient can swallow pills whole  Abnormal TSH -Nov 2021 TSH was 0.015-TSH and free T4, T3 normal this admission  Data Reviewed: Basic Metabolic Panel: Recent Labs  Lab 12/18/20 0144 12/19/20 0957 12/20/20 0225 12/21/20 0500 12/22/20 0236  NA 136 134* 136 137 138  K 4.9 4.7 5.1 5.1 5.3*  CL 109 108 110 112* 113*  CO2 20* 19* 20* 19* 17*  GLUCOSE 254* 199* 161* 120* 203*  BUN 51* 40* 44* 28* 26*  CREATININE 0.65 0.61 0.70 0.59 0.64  CALCIUM 11.2* 11.4* 11.4* 11.1* 11.4*  PHOS 2.7 3.2 3.1 2.9 3.0   Liver Function Tests: Recent Labs  Lab 12/18/20 0144 12/19/20 0957 12/20/20 0225 12/21/20 0500 12/22/20 0236  ALBUMIN 2.3* 2.4* 2.3* 2.2* 2.1*   No results for input(s): LIPASE, AMYLASE in the last 168  hours. No results for input(s): AMMONIA in the last 168 hours. CBC: Recent Labs  Lab 12/16/20 0147  WBC 13.4*  HGB 9.2*  HCT 30.9*  MCV 87.5  PLT 305   Cardiac Enzymes: No results for input(s): CKTOTAL, CKMB, CKMBINDEX, TROPONINI in the last 168 hours. BNP (last 3 results) Recent Labs    09/07/20 0118 09/08/20 0446 09/09/20 0428  BNP 216.8* 432.5* 609.7*    ProBNP (last 3 results) No results for input(s): PROBNP in the last 8760 hours.  CBG: Recent  Labs  Lab 12/21/20 1649 12/21/20 1950 12/21/20 2333 12/22/20 0342 12/22/20 0801  GLUCAP 168* 149* 271* 174* 140*    No results found for this or any previous visit (from the past 240 hour(s)).   Studies: IR Removal Tun Cv Cath W/O FL  Result Date: 12/20/2020 INDICATION: Patient with history of AKI in need of HD s/p right IJ tunneled HD catheter placed in IR 10/19/2020; most recently exchange in IR 11/29/2020. Patient now in renal recovery. Request is made for removal of tunneled HD catheter. EXAM: REMOVAL OF TUNNELED HEMODIALYSIS CATHETER MEDICATIONS: None COMPLICATIONS: None immediate. PROCEDURE: Informed written consent was obtained from the patient's daughter following an explanation of the procedure, risks, benefits and alternatives to treatment. A time out was performed prior to the initiation of the procedure. Maximal barrier sterile technique was utilized including caps, mask, sterile gowns, sterile gloves, large sterile drape, hand hygiene, and ChloraPrep. Utilizing blunt dissection, the catheter was removed intact. Hemostasis was obtained with manual compression. A dressing was placed. The patient tolerated the procedure well without immediate post procedural complication. IMPRESSION: Successful removal of tunneled dialysis catheter. Read by: Earley Abide, PA-C Electronically Signed   By: Ruthann Cancer MD   On: 12/20/2020 11:29    Scheduled Meds: . apixaban  2.5 mg Oral BID  . carbamide peroxide  10 drop Both EARS  BID  . chlorhexidine gluconate (MEDLINE KIT)  15 mL Mouth Rinse BID  . Chlorhexidine Gluconate Cloth  6 each Topical Q0600  . collagenase   Topical BID  . diclofenac Sodium  2 g Topical QID  . feeding supplement  237 mL Oral TID BM  . guaiFENesin  15 mL Per Tube Q12H   Or  . guaiFENesin  400 mg Oral Q12H  . insulin aspart  0-5 Units Subcutaneous QHS  . insulin detemir  12 Units Subcutaneous BID  . mouth rinse  15 mL Mouth Rinse QID  . megestrol  200 mg Oral QID  . metoprolol tartrate  37.5 mg Oral BID  . nystatin   Topical BID  . pantoprazole  40 mg Oral Daily  . sodium chloride  2 spray Each Nare Q6H  . sodium chloride flush  10-40 mL Intracatheter Q12H   Continuous Infusions: . albumin human 25 g (11/16/20 1516)    Principal Problem:   Atrial fibrillation with RVR (HCC) Active Problems:   Acute respiratory failure due to COVID-19 [facility])   New onset a-fib (HCC)   Leukocytosis   Atrial fibrillation with rapid ventricular response (HCC)   Hypoxia   Pressure injury of skin   Acute hypoxemic respiratory failure (HCC)   ARDS (adult respiratory distress syndrome) (HCC)   Perforated duodenal ulcer ([place])   On mechanically assisted ventilation ([place])   Palliative care by specialist   Goals of care, counseling/discussion   Shock ([place])   Acute and chronic respiratory failure (acute-on-chronic) ([place])   Status post tracheostomy ([place])   ESRD (end stage renal disease) ([place])   HAP (hospital-acquired pneumonia)   Consultants:  Cardiology  Surgery  Nephrology  Ethics  PCCM   Procedures: R PICC 11/5 >> A line 11/9 >> out ETT 11/9 > 11/16, 11/16 >> 09/21/2020, 09/21/2020 tracheostomy>> Lt [place] CVL 11/9 >> R IJ trialysis >> out HD catheter 12/1 >>12/20 12/21 - 14.5 Fr, 23 cm right IJ tunneled hemodialysis catheter placement. Removal of indwelling subclavian catheter.   Antibiotics: Anti-infectives (From admission, onward)   Start     Dose/Rate Route Frequency Ordered  Stop  12/07/20 1200  vancomycin (VANCOCIN) IVPB 1000 mg/200 mL premix  Status:  Discontinued        1,000 mg 200 mL/hr over 60 Minutes Intravenous Every T-Th-Sa (Hemodialysis) 12/06/20 2156 12/07/20 1113   12/06/20 2245  vancomycin (VANCOREADY) IVPB 2000 mg/400 mL        2,000 mg 200 mL/hr over 120 Minutes Intravenous  Once 12/06/20 2156 12/07/20 0211   11/27/20 2000  ceFAZolin (ANCEF) IVPB 1 g/50 mL premix        1 g 100 mL/hr over 30 Minutes Intravenous Every 24 hours 11/26/20 1248 12/03/20 2136   11/26/20 1345  ceFAZolin (ANCEF) IVPB 1 g/50 mL premix        1 g 100 mL/hr over 30 Minutes Intravenous  Once 11/26/20 1248 11/26/20 1433   11/17/20 1415  fluconazole (DIFLUCAN) 40 MG/ML suspension 200 mg        200 mg Oral  Once 11/17/20 1315 11/17/20 1357   11/03/20 2200  meropenem (MERREM) 500 mg in sodium chloride 0.9 % 100 mL IVPB        500 mg 200 mL/hr over 30 Minutes Intravenous Every 24 hours 11/03/20 1500 11/06/20 2217   11/01/20 1000  anidulafungin (ERAXIS) 100 mg in sodium chloride 0.9 % 100 mL IVPB  Status:  Discontinued       "Followed by" Linked Group Details   100 mg 78 mL/hr over 100 Minutes Intravenous Every 24 hours 10/31/20 0916 11/01/20 1404   10/31/20 1015  meropenem (MERREM) 1 g in sodium chloride 0.9 % 100 mL IVPB  Status:  Discontinued        1 g 200 mL/hr over 30 Minutes Intravenous Every 8 hours 10/31/20 0916 11/03/20 1500   10/31/20 1015  linezolid (ZYVOX) IVPB 600 mg  Status:  Discontinued        600 mg 300 mL/hr over 60 Minutes Intravenous Every 12 hours 10/31/20 0916 11/02/20 0906   10/31/20 1015  anidulafungin (ERAXIS) 200 mg in sodium chloride 0.9 % 200 mL IVPB       "Followed by" Linked Group Details   200 mg 78 mL/hr over 200 Minutes Intravenous  Once 10/31/20 0916 10/31/20 1630   10/25/20 1800  ceFAZolin (ANCEF) IVPB 2g/100 mL premix  Status:  Discontinued        2 g 200 mL/hr over 30 Minutes Intravenous Every 12 hours 10/25/20 1022 10/30/20 1103    10/23/20 2000  vancomycin (VANCOREADY) IVPB 750 mg/150 mL  Status:  Discontinued        750 mg 150 mL/hr over 60 Minutes Intravenous Every 24 hours 10/22/20 2036 10/25/20 1022   10/23/20 1800  piperacillin-tazobactam (ZOSYN) IVPB 3.375 g  Status:  Discontinued        3.375 g 100 mL/hr over 30 Minutes Intravenous Every 6 hours 10/23/20 1155 10/24/20 1422   10/23/20 0200  piperacillin-tazobactam (ZOSYN) IVPB 3.375 g  Status:  Discontinued        3.375 g 100 mL/hr over 30 Minutes Intravenous Every 8 hours 10/22/20 2036 10/23/20 1155   10/22/20 1630  vancomycin (VANCOREADY) IVPB 1500 mg/300 mL        1,500 mg 150 mL/hr over 120 Minutes Intravenous  Once 10/22/20 1537 10/22/20 2229   10/22/20 1630  piperacillin-tazobactam (ZOSYN) IVPB 2.25 g  Status:  Discontinued        2.25 g 100 mL/hr over 30 Minutes Intravenous Every 8 hours 10/22/20 1537 10/22/20 2036   10/22/20 1537  vancomycin variable dose per unstable renal  function (pharmacist dosing)  Status:  Discontinued         Does not apply See admin instructions 10/22/20 1537 10/22/20 2036   10/19/20 0600  ceFAZolin (ANCEF) IVPB 2g/100 mL premix        2 g 200 mL/hr over 30 Minutes Intravenous To Radiology 10/18/20 1457 10/19/20 0938   10/10/20 0830  vancomycin (VANCOCIN) IVPB 1000 mg/200 mL premix       "Followed by" Linked Group Details   1,000 mg 200 mL/hr over 60 Minutes Intravenous Every 24 hours 10/09/20 0744 10/15/20 0902   10/09/20 0830  piperacillin-tazobactam (ZOSYN) IVPB 3.375 g        3.375 g 100 mL/hr over 30 Minutes Intravenous Every 6 hours 10/09/20 0744 10/16/20 0038   10/09/20 0830  vancomycin (VANCOREADY) IVPB 2000 mg/400 mL       "Followed by" Linked Group Details   2,000 mg 200 mL/hr over 120 Minutes Intravenous  Once 10/09/20 0744 10/09/20 1022   09/24/20 1000  ceFAZolin (ANCEF) IVPB 2g/100 mL premix  Status:  Discontinued        2 g 200 mL/hr over 30 Minutes Intravenous Every 12 hours 09/24/20 0801 09/24/20 1046    09/23/20 1400  vancomycin (VANCOCIN) IVPB 1000 mg/200 mL premix        1,000 mg 200 mL/hr over 60 Minutes Intravenous Every 24 hours 09/22/20 1436 09/28/20 1718   09/22/20 2200  ceFEPIme (MAXIPIME) 2 g in sodium chloride 0.9 % 100 mL IVPB  Status:  Discontinued        2 g 200 mL/hr over 30 Minutes Intravenous Every 12 hours 09/22/20 1436 09/24/20 0801   09/22/20 1030  ceFEPIme (MAXIPIME) 1 g in sodium chloride 0.9 % 100 mL IVPB        1 g 200 mL/hr over 30 Minutes Intravenous  Once 09/22/20 0934 09/22/20 1145   09/22/20 1015  vancomycin (VANCOCIN) IVPB 1000 mg/200 mL premix        1,000 mg 200 mL/hr over 60 Minutes Intravenous  Once 09/22/20 0934 09/22/20 1446   09/12/20 2200  ceFEPIme (MAXIPIME) 2 g in sodium chloride 0.9 % 100 mL IVPB        2 g 200 mL/hr over 30 Minutes Intravenous Every 12 hours 09/12/20 0732 09/14/20 2134   09/11/20 1400  ceFEPIme (MAXIPIME) 2 g in sodium chloride 0.9 % 100 mL IVPB  Status:  Discontinued        2 g 200 mL/hr over 30 Minutes Intravenous Every 8 hours 09/11/20 1126 09/12/20 0732   09/08/20 1000  vancomycin (VANCOREADY) IVPB 2000 mg/400 mL        2,000 mg 200 mL/hr over 120 Minutes Intravenous  Once 09/08/20 0857 09/08/20 1224   09/08/20 1000  ceFEPIme (MAXIPIME) 2 g in sodium chloride 0.9 % 100 mL IVPB  Status:  Discontinued        2 g 200 mL/hr over 30 Minutes Intravenous Every 12 hours 09/08/20 0857 09/11/20 1126   09/08/20 0856  vancomycin variable dose per unstable renal function (pharmacist dosing)  Status:  Discontinued         Does not apply See admin instructions 09/08/20 0857 09/09/20 0935   09/02/20 1600  cefTRIAXone (ROCEPHIN) 1 g in sodium chloride 0.9 % 100 mL IVPB  Status:  Discontinued        1 g 200 mL/hr over 30 Minutes Intravenous Every 24 hours 09/01/20 1811 09/02/20 0838   09/01/20 1800  fluconazole (DIFLUCAN) IVPB 400 mg  400 mg 50 mL/hr over 240 Minutes Intravenous  Once 09/01/20 1749 09/02/20 0603   09/01/20 1530   piperacillin-tazobactam (ZOSYN) IVPB 3.375 g        3.375 g 12.5 mL/hr over 240 Minutes Intravenous Every 8 hours 09/01/20 1514 09/05/20 2111   09/01/20 1000  levofloxacin (LEVAQUIN) tablet 250 mg  Status:  Discontinued        250 mg Oral Daily 08/31/20 1508 08/31/20 1735   08/31/20 1730  cefTRIAXone (ROCEPHIN) 1 g in sodium chloride 0.9 % 100 mL IVPB  Status:  Discontinued        1 g 200 mL/hr over 30 Minutes Intravenous Every 24 hours 08/31/20 1726 09/01/20 1513   08/31/20 1730  azithromycin (ZITHROMAX) 500 mg in sodium chloride 0.9 % 250 mL IVPB  Status:  Discontinued        500 mg 250 mL/hr over 60 Minutes Intravenous Every 24 hours 08/31/20 1726 09/02/20 0838       Time spent: 45 minutes    [name] ANP  [facility] 7 am - 330 pm/M-F for direct patient care and secure chat Please refer to Amion for contact info 112  days

## 2020-12-22 NOTE — TOC Progression Note (Signed)
Transition of Care [facility]) - Progression Note    Patient Details  Name: Candace [name] MRN: [id number] Date of Birth: October 13, 1951  Transition of Care [facility]) CM/SW [place], RN Phone Number: 12/22/2020, 10:41 AM  Clinical Narrative:    Case management spoke with the patient and daughter, Candace [name], at the bedside regarding transitions of care for home.  The daughter is expecting the hospital bed to be delivered to the home later today.  I called with Lucretia, CM with [employer] and no available bed side table is present at the [facility] location.  I was referred to call and speak with the [employer] store to allow family to pick up the equipment.  The CM at [place] states that they might be able to place the bedside table on the delivery truck with the hospital bed since it has not been loaded at this point.  I asked [employer] about availability of [employer] device and equipment and they are unable to provide this equipment, but the above information was given to the daughter, Candace [name], and she was also given [employer] wik contact number [phone] to follow up for possible order through Medicare if able  - daughter, Candace [name] in agreement.  CM plans are to discharge the patient home by PTAR tomorrow since this hospital bed may not arrive to the home until later this evening and family in agreement.  The 3-M wound vac for home is present in the hospital room and will be changed over by bedside nursing tomorrow prior to transport home.  The daughter, Candace [name], verbalized understanding to diabetic teaching with diabetes coordinator and bedside nursing and was please with the information, teaching and recall of the information.  CM and MSW answered patient's daughter's questions at the bedside and will continue to follow the patient for discharge needs for transitions of care to home pending discharge tomorrow 12/23/2020  Expected Discharge Plan: [place] Barriers to Discharge: Continued  Medical Work up (plans to discharge home with family with home health services)  Expected Discharge Plan and Services Expected Discharge Plan: [place] In-house Referral: Clinical Social Work Discharge Planning Services: CM Consult Post Acute Care Choice: [place] arrangements for the past 2 months: Single Family Home                 DME Arranged: Specialty bed,Trapeze,Hospital bed,Wheelchair electric Product manager lift) DME Agency: [employer] Date DME Agency Contacted: 12/21/20 Time DME Agency Contacted: [phone] Representative spoke with at DME Agency: spoke with [name], [place] at [employer] about delivering hospital bed to the home today or tomorrow when arranged with the patient's daughter, Candace [name] today HH Arranged: RN,Nurse's Aide,PT,OT HH Agency: Center Date [facility] Agency Contacted: 12/21/20 Time [place]: 1038 Representative spoke with at [place]: Tommi Rumps, [place] with [place]   Social Determinants of Health ([place]) Interventions    Readmission Risk Interventions Readmission Risk Prevention Plan 11/19/2020  Transportation Screening Complete  Medication Review Press photographer) Complete  PCP or Specialist appointment within 3-5 days of discharge Complete  HRI or Home Care Consult Complete  SW Recovery Care/Counseling Consult Complete  Palliative Care Screening Complete  Skilled Nursing Facility Complete  Some recent data might be hidden

## 2020-12-22 NOTE — Care Management Important Message (Signed)
Important Message  Patient Details  Name: Candace [name] MRN: [id number] Date of Birth: Oct 28, 1951   Medicare Important Message Given:  Yes - Important Message mailed due to current National Emergency  Verbal consent obtained due to current National Emergency  Relationship to patient: Child   Call Date: 12/22/20  Time: 1429 Phone: [phone] Outcome: Spoke with contact Important Message mailed to: Patient address on file    Delorse Lek 12/22/2020, 2:29 PM

## 2020-12-22 NOTE — Progress Notes (Addendum)
38 Spoke w/pt daughter and provided assistance w/ repositioning. Asked daughter if any needs for d/c. I advised them to start taking stuff home two days prior.   2030 gave pt meds, including pain, burped her ostomy, readjusted in bed, offered food and liquids. Pt refused her normal meds. Pt is resting comfortably at this time.  2215 Pt is resting comfortably at this time. Stated her positioning is fine. Will monitor.   2345 Pt rotated in bed by other staff. No complaints at this time.   0225 Pt resting comfortably in bed. Pillows adjusted, lines checked. No issues/complaints at this time.   0320 Pt requested pain meds and to be adjusted in bed. Pt ostomy is emptied. Will monitor.   0540 Pt wanting to wait on bath and dressing change and get it done before she leaves. Adjusted temp and provided a warm blanket. Readjusted pt in bed, assessed ostomy. Having general conversation w/pt and wished her a speedy recovery.

## 2020-12-22 NOTE — Plan of Care (Signed)
  Problem: Education: Goal: Utilization of techniques to improve thought processes will improve Outcome: Progressing Goal: Knowledge of the prescribed therapeutic regimen will improve Outcome: Progressing   Problem: Activity: Goal: Interest or engagement in leisure activities will improve Outcome: Progressing Goal: Imbalance in normal sleep/wake cycle will improve Outcome: Progressing   Problem: Coping: Goal: Coping ability will improve Outcome: Progressing Goal: Will verbalize feelings Outcome: Progressing   Problem: Health Behavior/Discharge Planning: Goal: Ability to make decisions will improve Outcome: Progressing Goal: Compliance with therapeutic regimen will improve Outcome: Progressing   Problem: Role Relationship: Goal: Will demonstrate positive changes in social behaviors and relationships Outcome: Progressing   Problem: Safety: Goal: Ability to identify and utilize support systems that promote safety will improve Outcome: Progressing   Problem: Self-Concept: Goal: Level of anxiety will decrease Outcome: Progressing   Problem: Education: Goal: Knowledge of General Education information will improve Description: Including pain rating scale, medication(s)/side effects and non-pharmacologic comfort measures Outcome: Progressing   Problem: Health Behavior/Discharge Planning: Goal: Ability to manage health-related needs will improve Outcome: Progressing   Problem: Clinical Measurements: Goal: Ability to maintain clinical measurements within normal limits will improve Outcome: Progressing Goal: Will remain free from infection Outcome: Progressing Goal: Diagnostic test results will improve Outcome: Progressing Goal: Respiratory complications will improve Outcome: Progressing Goal: Cardiovascular complication will be avoided Outcome: Progressing   Problem: Activity: Goal: Risk for activity intolerance will decrease Outcome: Progressing   Problem:  Nutrition: Goal: Adequate nutrition will be maintained Outcome: Progressing   Problem: Coping: Goal: Level of anxiety will decrease Outcome: Progressing   Problem: Elimination: Goal: Will not experience complications related to bowel motility Outcome: Progressing Goal: Will not experience complications related to urinary retention Outcome: Progressing   Problem: Pain Managment: Goal: General experience of comfort will improve Outcome: Progressing   Problem: Safety: Goal: Ability to remain free from injury will improve Outcome: Progressing   Problem: Skin Integrity: Goal: Risk for impaired skin integrity will decrease Outcome: Progressing   Problem: Activity: Goal: Ability to tolerate increased activity will improve Outcome: Progressing   Problem: Respiratory: Goal: Ability to maintain a clear airway and adequate ventilation will improve Outcome: Progressing   Problem: Role Relationship: Goal: Method of communication will improve Outcome: Progressing

## 2020-12-22 NOTE — Progress Notes (Signed)
Am meds completed. Mouth care completed. Assisted patient in turning to left side. Daughter renains at bedside participating in care/. Safety precautions maintained.

## 2020-12-23 DIAGNOSIS — I4891 Unspecified atrial fibrillation: Secondary | ICD-10-CM | POA: Diagnosis not present

## 2020-12-23 DIAGNOSIS — J9621 Acute and chronic respiratory failure with hypoxia: Secondary | ICD-10-CM | POA: Diagnosis not present

## 2020-12-23 LAB — RENAL FUNCTION PANEL
Albumin: 2.1 g/dL — ABNORMAL LOW (ref 3.5–5.0)
Anion gap: 7 (ref 5–15)
BUN: 18 mg/dL (ref 8–23)
CO2: 18 mmol/L — ABNORMAL LOW (ref 22–32)
Calcium: 11.4 mg/dL — ABNORMAL HIGH (ref 8.9–10.3)
Chloride: 110 mmol/L (ref 98–111)
Creatinine, Ser: 0.56 mg/dL (ref 0.44–1.00)
GFR, Estimated: >60 mL/min (ref >60)
Glucose, Bld: 117 mg/dL — ABNORMAL HIGH (ref 70–99)
Phosphorus: 2.7 mg/dL (ref 2.5–4.6)
Potassium: 4.9 mmol/L (ref 3.5–5.1)
Sodium: 135 mmol/L (ref 135–145)

## 2020-12-23 LAB — GLUCOSE, CAPILLARY
Glucose-Capillary: 141 mg/dL — ABNORMAL HIGH (ref 70–99)
Glucose-Capillary: 257 mg/dL — ABNORMAL HIGH (ref 70–99)
Glucose-Capillary: 88 mg/dL (ref 70–99)

## 2020-12-23 NOTE — Progress Notes (Signed)
Pt seems to be in good mood. Pt is planned to d/c in the AM. Pt is laughing, smiling, and telling jokes. Will continue to try to uplift pt.

## 2020-12-23 NOTE — Plan of Care (Signed)
  Problem: Education: Goal: Utilization of techniques to improve thought processes will improve Outcome: Progressing Goal: Knowledge of the prescribed therapeutic regimen will improve Outcome: Progressing   Problem: Activity: Goal: Interest or engagement in leisure activities will improve Outcome: Progressing Goal: Imbalance in normal sleep/wake cycle will improve Outcome: Progressing   Problem: Coping: Goal: Coping ability will improve Outcome: Progressing Goal: Will verbalize feelings Outcome: Progressing   Problem: Health Behavior/Discharge Planning: Goal: Ability to make decisions will improve Outcome: Progressing Goal: Compliance with therapeutic regimen will improve Outcome: Progressing   Problem: Role Relationship: Goal: Will demonstrate positive changes in social behaviors and relationships Outcome: Progressing   Problem: Safety: Goal: Ability to identify and utilize support systems that promote safety will improve Outcome: Progressing   Problem: Self-Concept: Goal: Level of anxiety will decrease Outcome: Progressing   Problem: Education: Goal: Knowledge of General Education information will improve Description: Including pain rating scale, medication(s)/side effects and non-pharmacologic comfort measures Outcome: Progressing   Problem: Health Behavior/Discharge Planning: Goal: Ability to manage health-related needs will improve Outcome: Progressing   Problem: Clinical Measurements: Goal: Ability to maintain clinical measurements within normal limits will improve Outcome: Progressing Goal: Will remain free from infection Outcome: Progressing Goal: Diagnostic test results will improve Outcome: Progressing Goal: Respiratory complications will improve Outcome: Progressing Goal: Cardiovascular complication will be avoided Outcome: Progressing   Problem: Activity: Goal: Risk for activity intolerance will decrease Outcome: Progressing   Problem:  Nutrition: Goal: Adequate nutrition will be maintained Outcome: Progressing   Problem: Coping: Goal: Level of anxiety will decrease Outcome: Progressing   Problem: Elimination: Goal: Will not experience complications related to bowel motility Outcome: Progressing Goal: Will not experience complications related to urinary retention Outcome: Progressing   Problem: Pain Managment: Goal: General experience of comfort will improve Outcome: Progressing   Problem: Safety: Goal: Ability to remain free from injury will improve Outcome: Progressing   Problem: Skin Integrity: Goal: Risk for impaired skin integrity will decrease Outcome: Progressing   Problem: Activity: Goal: Ability to tolerate increased activity will improve Outcome: Progressing   Problem: Respiratory: Goal: Ability to maintain a clear airway and adequate ventilation will improve Outcome: Progressing   Problem: Role Relationship: Goal: Method of communication will improve Outcome: Progressing   Problem: Education: Goal: Knowledge of disease and its progression will improve Outcome: Progressing   Problem: Fluid Volume: Goal: Compliance with measures to maintain balanced fluid volume will improve Outcome: Progressing   Problem: Health Behavior/Discharge Planning: Goal: Ability to manage health-related needs will improve Outcome: Progressing   Problem: Nutritional: Goal: Ability to make healthy dietary choices will improve Outcome: Progressing   Problem: Clinical Measurements: Goal: Complications related to the disease process, condition or treatment will be avoided or minimized Outcome: Progressing

## 2020-12-23 NOTE — Progress Notes (Signed)
Daughter and patient verbalized understanding of d/c instructions. Patient discharged to home via medic. Packet sent with patient.

## 2020-12-23 NOTE — TOC Transition Note (Signed)
Transition of Care [facility]) - CM/SW Discharge Note   Patient Details  Name: Candace [name] MRN: [id number] Date of Birth: 1951-09-19  Transition of Care [facility]) CM/SW Contact:  [name], RN Phone Number: 12/23/2020, 10:53 AM   Clinical Narrative:    Case management met with the patient at the bedside regarding transitions of care to home today with family and home health services.  I spoke with the patient and daughter, Candace [name], at the bedside and answered questions regarding transitions of care to home including dme and home health services follow up through [place], daughter verbalized understanding and will receive discharge instructions and AVS paperwork from bedside nursing prior to discharge to home today.  I called and spoke with Candace [name], [facility] at [facility] and they are aware that patient is being discharged home today by [facility] ambulance service and will follow up with family for visit times in the next 1-2 days.  Candace [name] is aware that the patient has a KCi wound vac and will need ordered dressing changes.  I called and spoke with Candace [name], CM at [employer] and they are aware that the patient'd family did not receive the hoyer lift yesterday with specialty bed delivery, but hoyer lift will be delivered to the patient's home today at 3 pm and the daughter, Candace [name], is aware.  I spoke with the daughter, Candace [name], and the bedside nurse and made them aware that the patient will be discharged home by [facility] ambulance today after 11 am.  CM will continue to follow the patient for discharge to home today by PTAR.    Final next level of care: [place] (LTAC versus home with home health services) Barriers to Discharge: Continued Medical Work up (plans to discharge home with family with home health services)   Patient Goals and CMS Choice Patient states their goals for this hospitalization and ongoing recovery are:: Patient plans on discharging home with family. CMS  Medicare.gov Compare Post Acute Care list provided to:: Patient Choice offered to / list presented to : Patient  Discharge Placement                       Discharge Plan and Services In-house Referral: Clinical Social Work Discharge Planning Services: CM Consult Post Acute Care Choice: [place]          DME Arranged: Specialty bed,Trapeze,Hospital bed,Wheelchair electric Candace [name] lift) DME Agency: [employer] Date DME Agency Contacted: 12/21/20 Time DME Agency Contacted: [phone] Representative spoke with at DME Agency: spoke with Candace [name], [place] at [employer] about delivering hospital bed to the home today or tomorrow when arranged with the patient's daughter, Candace [name] today HH Arranged: RN,Nurse's Aide,PT,OT [place]: [place] Date [place]: 12/21/20 Time [place]: [place] Representative spoke with at [place]: Candace [name], [place] with [place]  Social Determinants of Health ([place]) Interventions     Readmission Risk Interventions Readmission Risk Prevention Plan 11/19/2020  Transportation Screening Complete  Medication Review Press photographer) Complete  PCP or Specialist appointment within 3-5 days of discharge Complete  HRI or Home Care Consult Complete  SW Recovery Care/Counseling Consult Complete  Palliative Care Screening Complete  Skilled Nursing Facility Complete  Some recent data might be hidden

## 2020-12-23 NOTE — TOC Progression Note (Addendum)
Transition of Care [facility]) - Progression Note    Patient Details  Name: Candace [name] MRN: [id number] Date of Birth: 11/26/1950  Transition of Care [facility]) CM/SW [place], RN Phone Number: 12/23/2020, 7:44 AM  Clinical Narrative:    Case management received phone message from [place], daughter that the family received specialty bed at the home yesterday but did not receive [facility] lift nor bedside table.  I called and spoke with Shiela, CM at [employer] this morning to follow up on availability of both.  Shiela, CM will follow up with me as soon as she is able to determine availability of both pieces of equipment.  I plan to call another dme company for availability if hoyer lift is not available through [place].  CM will continue to follow the patient for transitions of care needs.  12/23/2020 7062 - Spoke with [name], CM at [employer] and they have a hoyer lift available and will deliver the equipment to the patient's home today at 3 pm.  I will make the daughter, [name] aware of the delivery this morning when I follow up with nursing regarding transport plans for home today.  Expected Discharge Plan: [place] Barriers to Discharge: Continued Medical Work up (plans to discharge home with family with home health services)  Expected Discharge Plan and Services Expected Discharge Plan: [place] In-house Referral: Clinical Social Work Discharge Planning Services: CM Consult Post Acute Care Choice: [place] arrangements for the past 2 months: Single Family Home                 DME Arranged: Specialty bed,Trapeze,Hospital bed,Wheelchair electric Optician, dispensing) DME Agency: [employer] Date DME Agency Contacted: 12/21/20 Time DME Agency Contacted: [phone] Representative spoke with at DME Agency: spoke with [name], [place] at [employer] about delivering hospital bed to the home today or tomorrow when arranged with the patient's  daughter, [name] today HH Arranged: RN,Nurse's Aide,PT,OT HH Agency: [place] Date [facility] Agency Contacted: 12/21/20 Time [place]: [place] Representative spoke with at [place]: Tommi Rumps, [place] with [place]   Social Determinants of Health ([place]) Interventions    Readmission Risk Interventions Readmission Risk Prevention Plan 11/19/2020  Transportation Screening Complete  Medication Review Press photographer) Complete  PCP or Specialist appointment within 3-5 days of discharge Complete  HRI or Home Care Consult Complete  SW Recovery Care/Counseling Consult Complete  Palliative Care Screening Complete  Skilled Nursing Facility Complete  Some recent data might be hidden

## 2020-12-28 IMAGING — DX DG CHEST 1V PORT
1 series · 1 of 1 positions shown · non-contrast
Comparison: October 12, 2020 chest radiograph. Chest CT September 24, 2020

CLINICAL DATA: Respiratory failure

EXAM:
PORTABLE CHEST 1 VIEW

[chest]
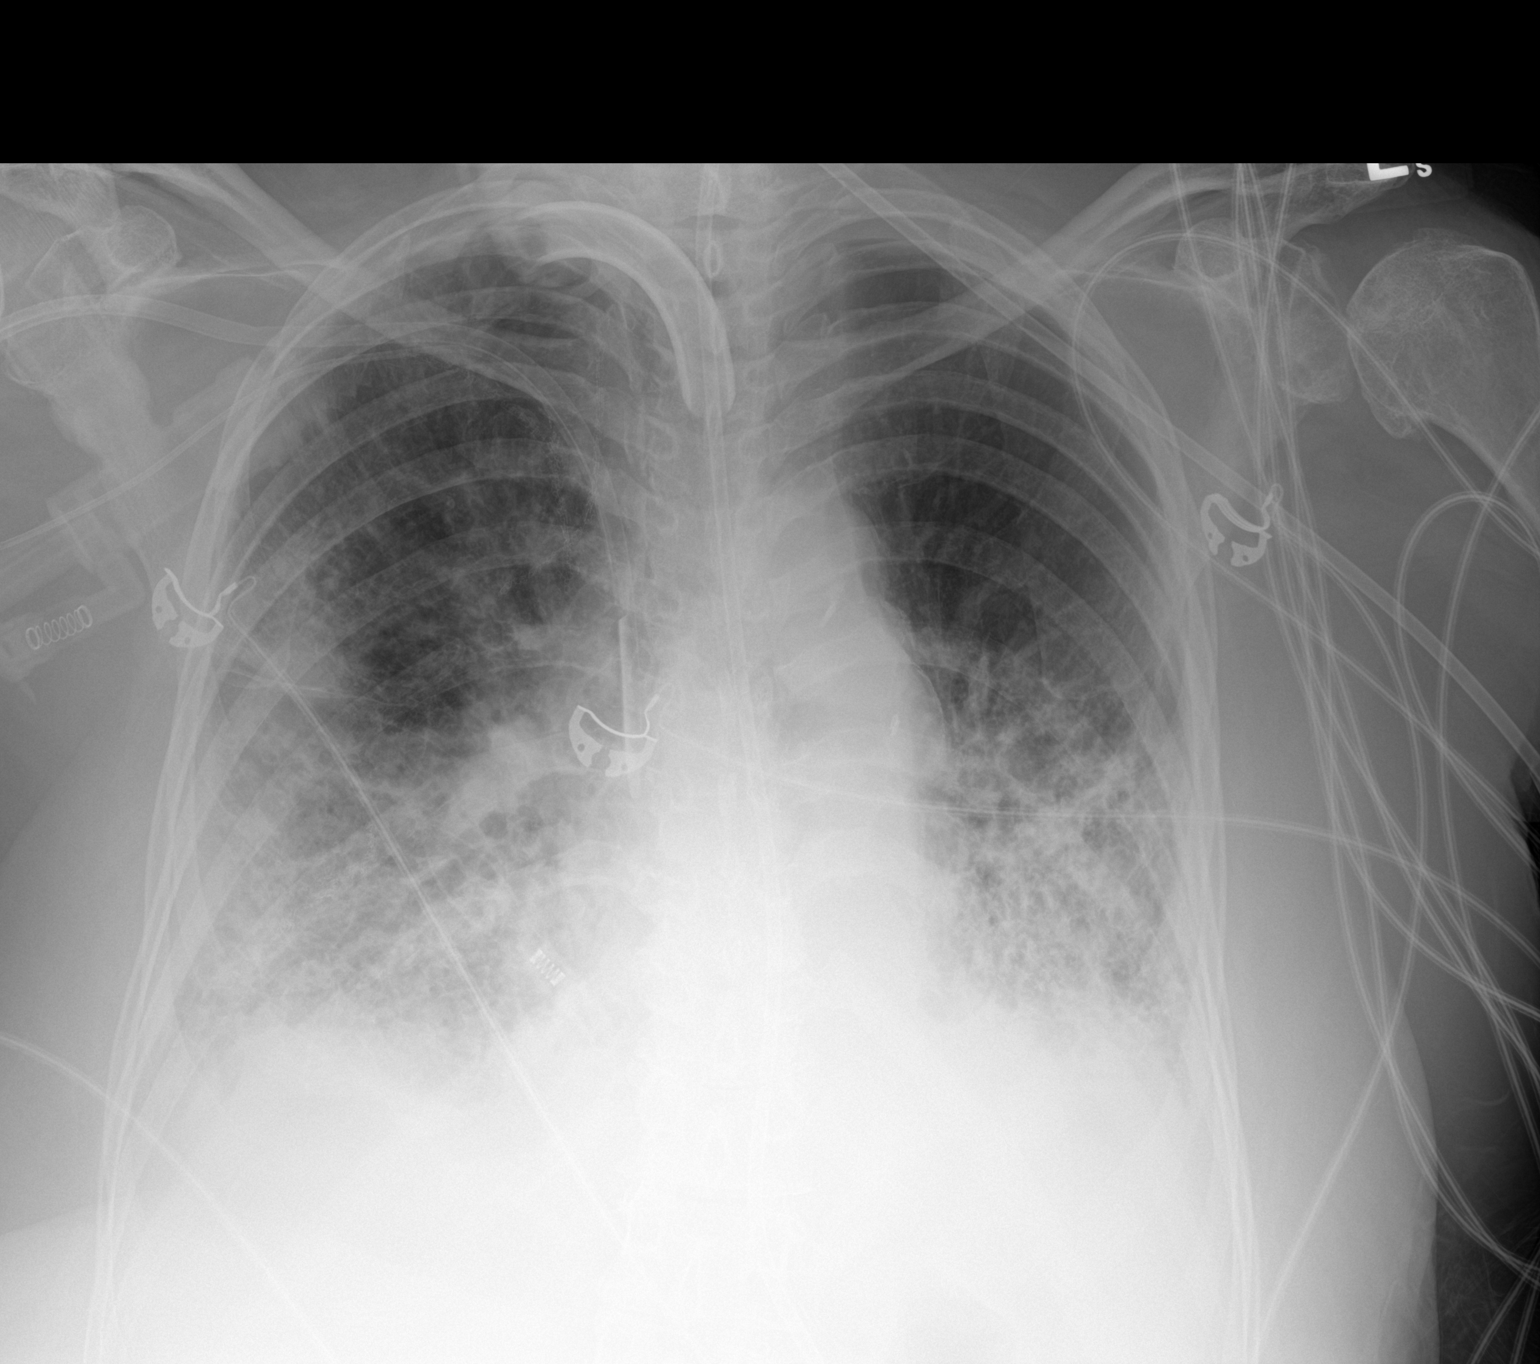

[1 of 1 positions shown; findings below may reference images not displayed]

FINDINGS: Tracheostomy catheter tip is 5.4 cm above the carina. Enteric tube
tip is below the diaphragm. Central catheter tip is in the superior
vena cava near the cavoatrial junction. No pneumothorax. There is
airspace opacity throughout both mid and lower lung zones with small
underlying pleural effusions. Heart is upper normal in size with
pulmonary vascularity normal. No adenopathy. There is aortic
atherosclerosis. No bone lesions.
IMPRESSION: Tube and catheter positions as described without pneumothorax.
Multifocal airspace opacity raises concern for underlying pneumonia,
potentially of atypical organism etiology. Check of [id number] status
advised. Small pleural effusions bilaterally. Note that there may
well be a degree of underlying ARDS and/or pulmonary edema. Stable
cardiac silhouette.

Aortic Atherosclerosis ([id number]).

## 2021-01-01 IMAGING — XA IR FLUORO GUIDE CV LINE*R*
1 series · 1 of 1 positions shown · non-contrast
Comparison: none

INDICATION: 69-year-old female with acute on chronic kidney disease requiring
permanent hemodialysis access.

[Series 1: single · 1 of 1 slices shown]
[im 1/1]
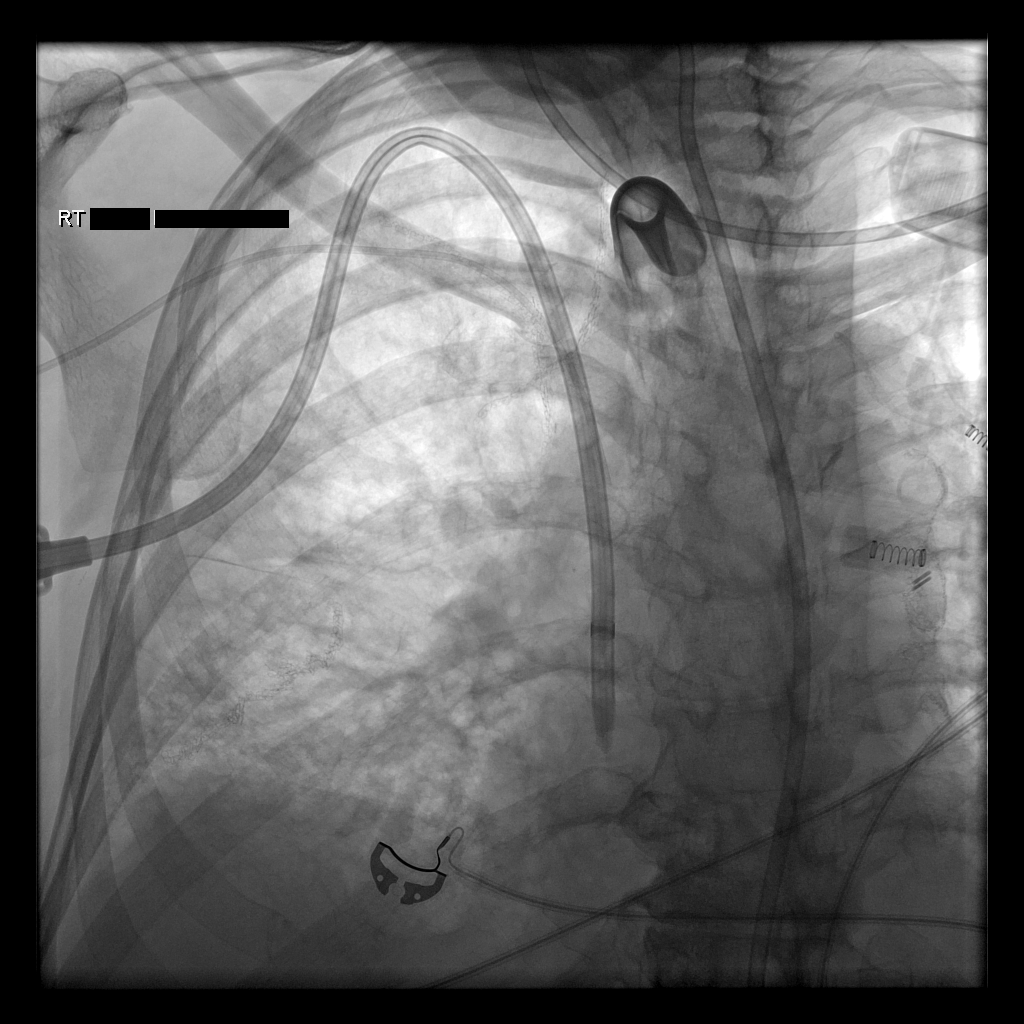

[1 of 1 positions shown; findings below may reference images not displayed]

EXAM:
TUNNELED CENTRAL VENOUS HEMODIALYSIS CATHETER PLACEMENT WITH
ULTRASOUND AND FLUOROSCOPIC GUIDANCE

MEDICATIONS:
Ancef 2 gm IV . The antibiotic was given in an appropriate time
interval prior to skin puncture.

ANESTHESIA/SEDATION:
Moderate (conscious) sedation was employed during this procedure. A
total of Versed 2 mg and Fentanyl 100 mcg was administered
intravenously.

Moderate Sedation Time: 12 minutes. The patient's level of
consciousness and vital signs were monitored continuously by
radiology nursing throughout the procedure under my direct
supervision.

FLUOROSCOPY TIME:  0 minutes 12 seconds (70 mGy).

COMPLICATIONS:
None immediate.



After creating a small venotomy incision, a 21 gauge micropuncture
kit was utilized to access the internal jugular vein. Real-time
ultrasound guidance was utilized for vascular access including the
acquisition of a permanent ultrasound image documenting patency of
the accessed vessel.

A Rosen wire was advanced to the level of the IVC and the
micropuncture sheath was exchanged for an 8 Fr dilator. A
French tunneled hemodialysis catheter measuring 23 cm from tip to
cuff was tunneled in a retrograde fashion from the anterior chest
wall to the venotomy incision. Serial dilation was then performed an
a peel-away sheath was placed.

The catheter was then placed through the peel-away sheath with the
catheter tip ultimately positioned within the right atrium. Final
catheter positioning was confirmed and documented with a spot
radiographic image. The catheter aspirates and flushes normally. The
catheter was flushed with appropriate volume heparin dwells.

The catheter exit site was secured with a 0-Silk retention suture.
The venotomy incision was closed with Dermabond. Sterile dressings
were applied. The patient tolerated the procedure well without
immediate post procedural complication.
IMPRESSION: Successful placement of 23 cm tip to cuff tunneled hemodialysis
catheter via the right internal jugular vein with catheter tip
terminating within the right atrium. The catheter is ready for
immediate use.

## 2021-01-02 IMAGING — DX DG CHEST 1V PORT
1 series · 1 of 1 positions shown · non-contrast
Comparison: October 19, 2020

CLINICAL DATA: Shortness of breath.  IFEST-RU positive

EXAM:
PORTABLE CHEST 1 VIEW

[chest ap]
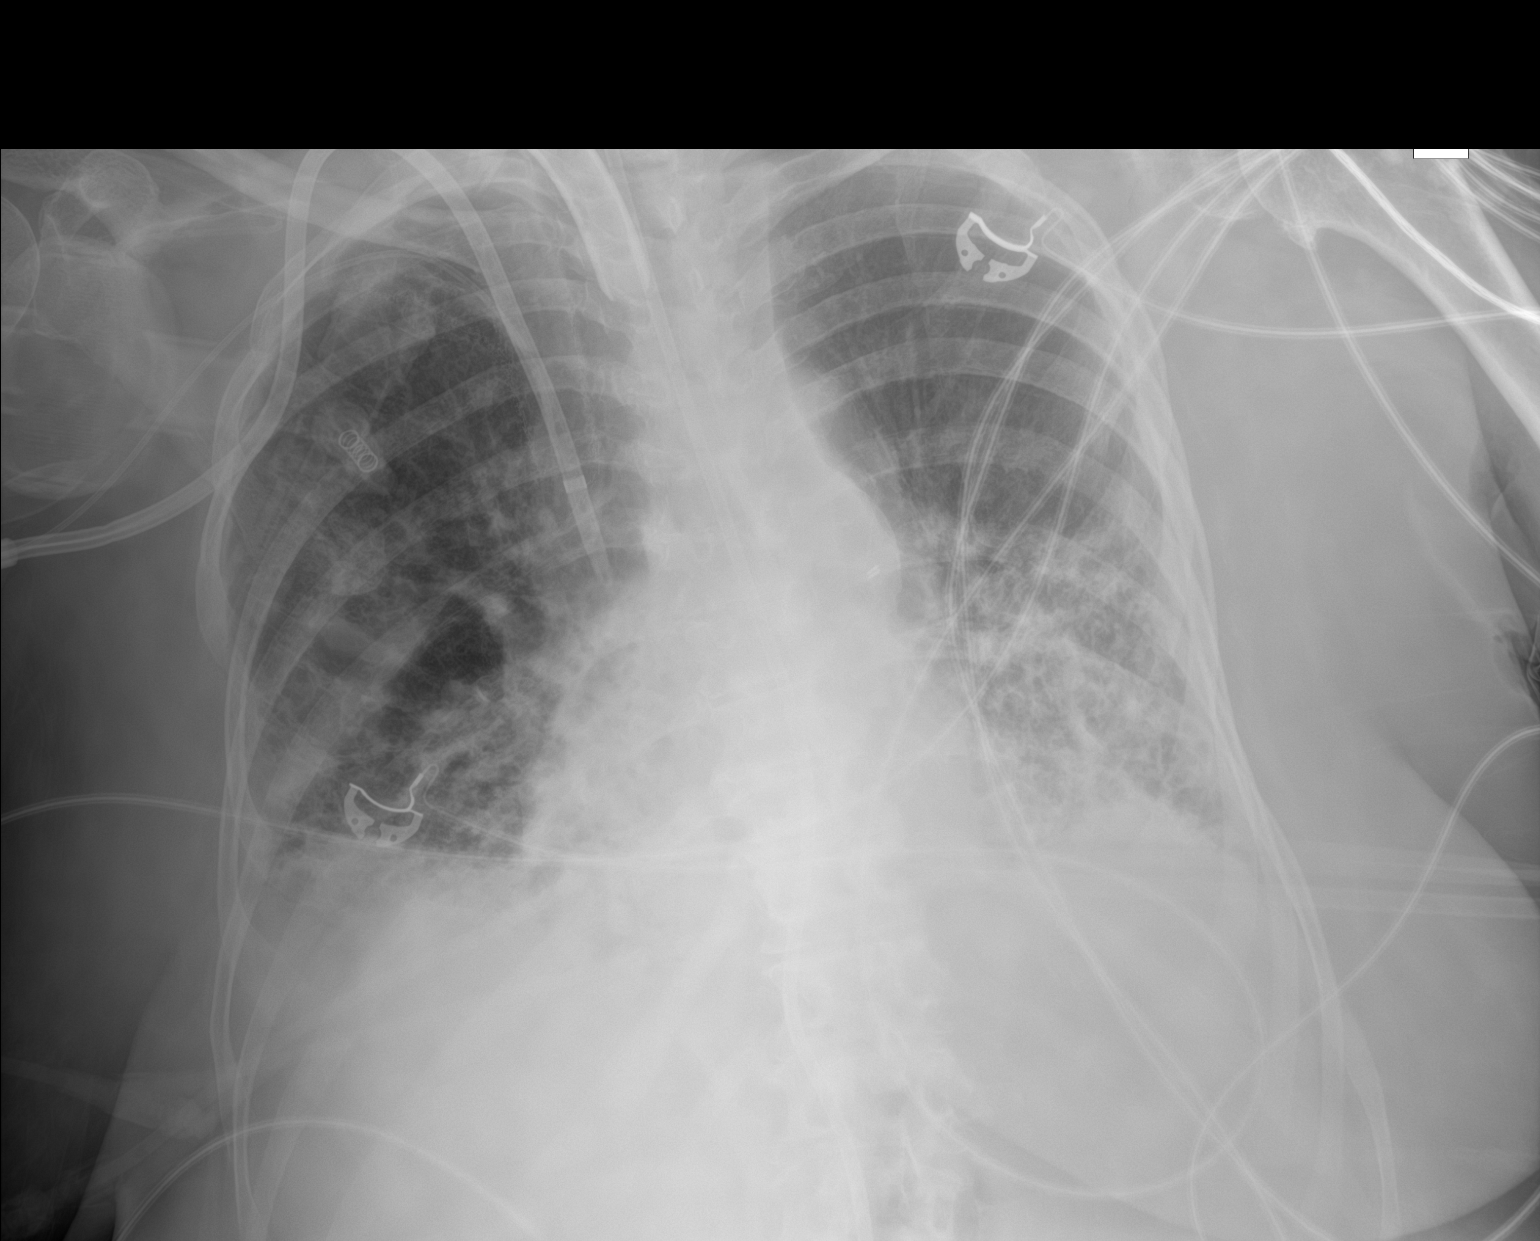

[1 of 1 positions shown; findings below may reference images not displayed]

FINDINGS: Tracheostomy catheter tip is 5.6 cm above the carina. Enteric tube
tip is below the diaphragm. Central catheter tip is in the superior
vena cava. No pneumothorax. Airspace opacity in both mid and lower
lower lung regions, somewhat more on the left than on the right,
remains stable. Small right pleural effusion. No new opacity. Heart
size is within normal limits. Pulmonary vascularity is normal. No
adenopathy. No bone lesions.
IMPRESSION: Airspace opacity consistent with multifocal pneumonia persists
without appreciable change. Stable small right pleural effusion.
Stable cardiac silhouette. Tube and catheter positions as described
without pneumothorax.

## 2021-01-21 IMAGING — DX DG ABD PORTABLE 1V
1 series · 2 of 2 positions shown · non-contrast
Comparison: CT scan of the chest, abdomen and pelvis 09/24/2020

CLINICAL DATA: Vomiting

EXAM:
PORTABLE ABDOMEN - 1 VIEW

[Series 1: abdomen · 0.14mm/px · 2 of 2 slices shown]
[im 1/2]
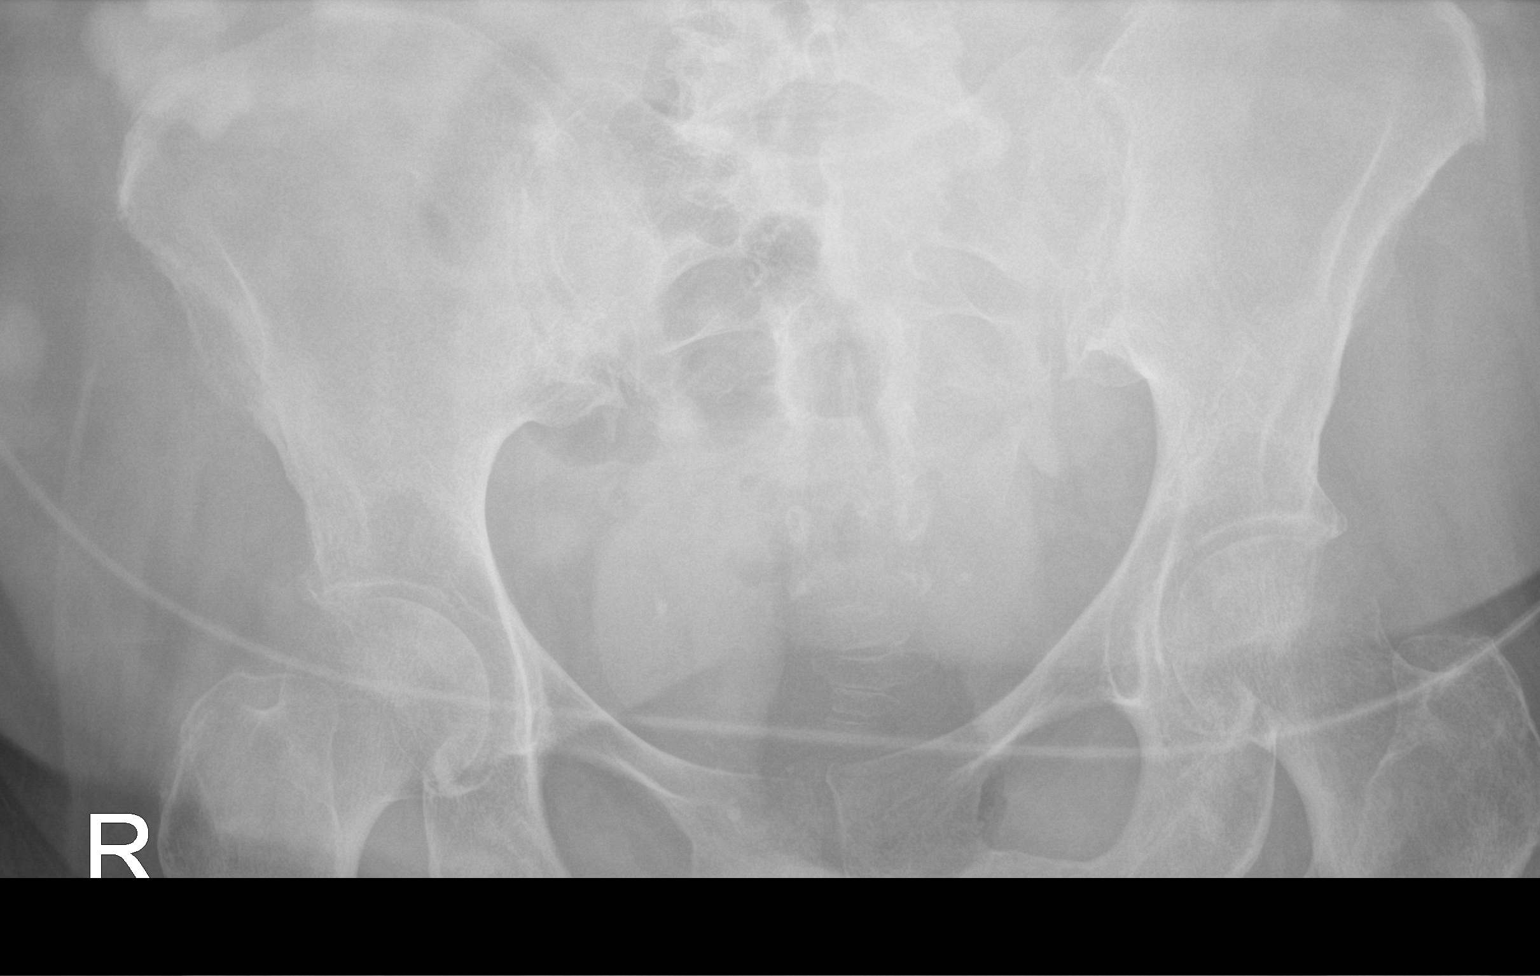
[im 2/2]
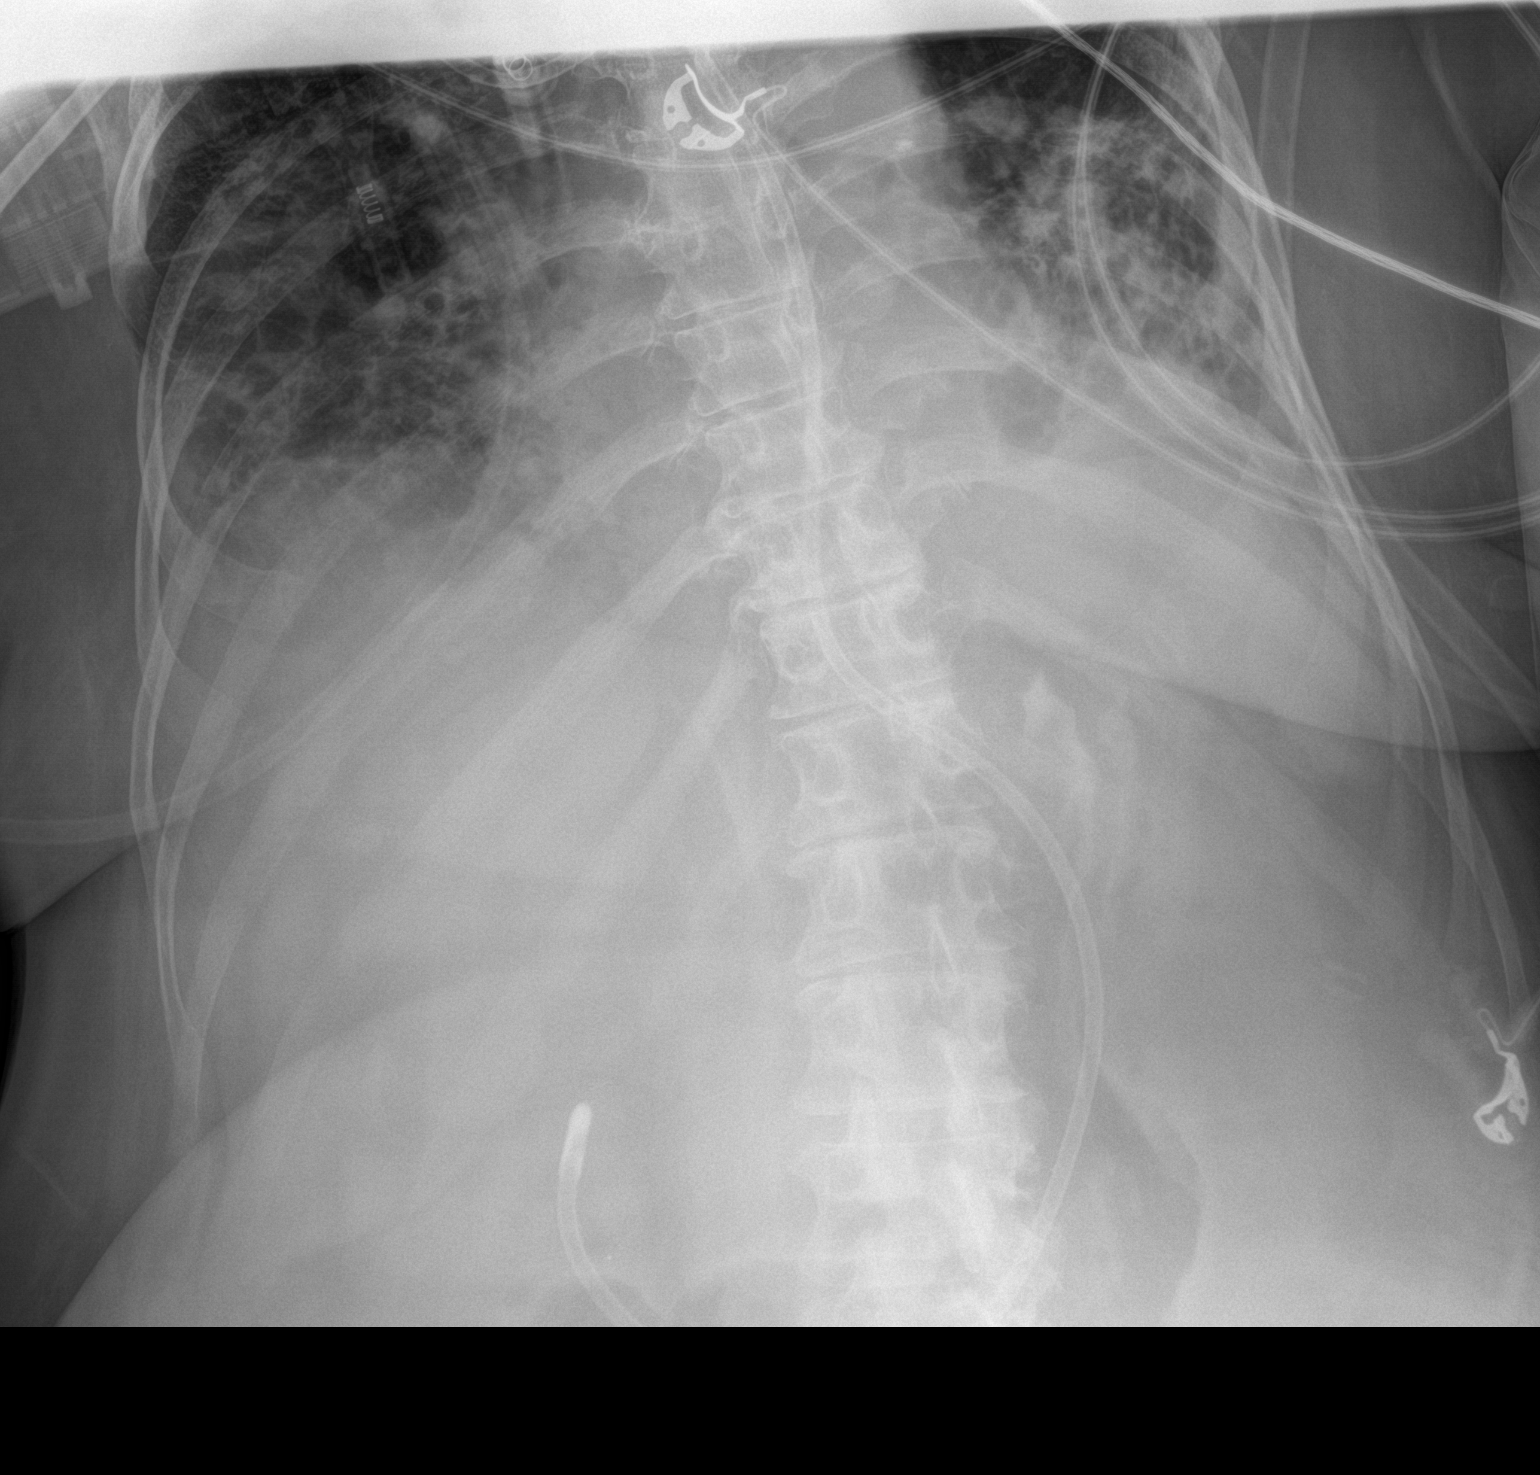

[2 of 2 positions shown; findings below may reference images not displayed]

FINDINGS: Enteric feeding tube in place. The tip overlies the expected
location of the pylorus. Extensive bibasilar interstitial and
airspace opacities again noted without significant interval change.
No evidence of gaseous distension of the bowel to suggest the
presence of a bowel obstruction. No acute osseous abnormality.
IMPRESSION: 1. No evidence of bowel obstruction.
2. The tip of the enteric feeding tube overlies the expected
location of the pylorus.
3. Extensive bibasilar interstitial and airspace opacities again
noted without significant interval change.

## 2023-03-03 ENCOUNTER — Ambulatory Visit (HOSPITAL_COMMUNITY)
Admission: EM | Admit: 2023-03-03 | Discharge: 2023-03-03 | Disposition: A | Discharge status: Home / Self Care | Payer: No Typology Code available for payment source | Attending: Physician Assistant | Admitting: Physician Assistant

## 2023-03-03 ENCOUNTER — Ambulatory Visit (INDEPENDENT_AMBULATORY_CARE_PROVIDER_SITE_OTHER): Payer: No Typology Code available for payment source

## 2023-03-03 ENCOUNTER — Encounter (HOSPITAL_COMMUNITY): Payer: Self-pay

## 2023-03-03 DIAGNOSIS — R051 Acute cough: Secondary | ICD-10-CM

## 2023-03-03 DIAGNOSIS — Z1152 Encounter for screening for COVID-19: Secondary | ICD-10-CM | POA: Diagnosis not present

## 2023-03-03 DIAGNOSIS — J329 Chronic sinusitis, unspecified: Secondary | ICD-10-CM

## 2023-03-03 DIAGNOSIS — R0981 Nasal congestion: Secondary | ICD-10-CM

## 2023-03-03 DIAGNOSIS — J40 Bronchitis, not specified as acute or chronic: Secondary | ICD-10-CM | POA: Insufficient documentation

## 2023-03-03 LAB — POCT INFLUENZA A/B
Influenza A, POC: NEGATIVE
Influenza B, POC: NEGATIVE

## 2023-03-03 MED ORDER — ALBUTEROL SULFATE HFA 108 (90 BASE) MCG/ACT IN AERS
INHALATION_SPRAY | RESPIRATORY_TRACT | Status: AC
  Filled 2023-03-03: qty 6.7

## 2023-03-03 MED ORDER — AMOXICILLIN-POT CLAVULANATE 875-125 MG PO TABS: 1.0000 | ORAL_TABLET | Freq: Two times a day (BID) | ORAL | 0 refills | Status: AC

## 2023-03-03 MED ORDER — ALBUTEROL SULFATE HFA 108 (90 BASE) MCG/ACT IN AERS
2.0000 | INHALATION_SPRAY | Freq: Once | RESPIRATORY_TRACT | Status: AC
  Administered 2023-03-03: 2 via RESPIRATORY_TRACT

## 2023-03-03 MED ORDER — FLUTICASONE PROPIONATE 50 MCG/ACT NA SUSP: 1.0000 | Freq: Every day | NASAL | 0 refills | Status: AC

## 2023-03-03 MED ORDER — AEROCHAMBER PLUS FLO-VU LARGE MISC
Status: AC
  Filled 2023-03-03: qty 1

## 2023-03-03 MED ORDER — AEROCHAMBER PLUS FLO-VU LARGE MISC
1.0000 | Freq: Once | Status: AC
  Administered 2023-03-03: 1

## 2023-03-03 MED ORDER — BENZONATATE 100 MG PO CAPS: 100.0000 mg | ORAL_CAPSULE | Freq: Three times a day (TID) | ORAL | 0 refills | Status: AC

## 2023-03-03 NOTE — ED Provider Notes (Signed)
[facility]    CSN: [id number] Arrival date & time: 03/03/23  1506      History   Chief Complaint Chief Complaint  Patient presents with   Nasal Congestion    HPI Candace [name] is a 72 y.o. female.   Patient presents today companied by her daughter who provide the majority of history.  Reports a 5-day history of URI symptoms including cough, congestion.  Denies any chest pain, shortness of breath, nausea, vomiting, diarrhea, sore throat, headache.  Has tried TheraFlu and other over-the-counter cough medicines without improvement of symptoms.  Denies history of seasonal allergies and has not been taking antihistamines regularly.  She has had COVID in 2021 which required hospitalization but has not had it since then.  She has not had the vaccines.  She does report being treated for a dental infection several months ago with amoxicillin but has not had additional antibiotics since that time.  Denies history of asthma or COPD.  She does not smoke.  She is having difficulty with daily activities as result of symptoms.    Past Medical History:  Diagnosis Date   Colon cancer (HCC)    Diabetes mellitus (HCC)    Hyperlipemia    Hypertension     Patient Active Problem List   Diagnosis Date Noted   HAP (hospital-acquired pneumonia)    Status post tracheostomy (HCC)    ESRD (end stage renal disease) (HCC)    Acute and chronic respiratory failure (acute-on-chronic) (HCC)    Shock (HCC)    Perforated duodenal ulcer (HCC)    On mechanically assisted ventilation (HCC)    Palliative care by specialist    Goals of care, counseling/discussion    Acute hypoxemic respiratory failure (HCC)    ARDS (adult respiratory distress syndrome) (HCC)    Pressure injury of skin 09/08/2020   Hypoxia    Acute respiratory failure due to COVID-19 (HCC) 08/31/2020   New onset a-fib (HCC) 08/31/2020   Atrial fibrillation with RVR (HCC) 08/31/2020   Leukocytosis 08/31/2020   Atrial fibrillation  with rapid ventricular response (HCC) 08/31/2020   AKI (acute kidney injury) (HCC) 08/23/2020    Past Surgical History:  Procedure Laterality Date   IR FLUORO GUIDE CV LINE RIGHT  10/19/2020   IR FLUORO GUIDE CV LINE RIGHT  11/29/2020   IR REMOVAL TUN CV CATH W/O FL  12/20/2020   IR US GUIDE VASC ACCESS RIGHT  10/19/2020   LAPAROTOMY N/A 09/01/2020   Procedure: EXPLORATORY LAPAROTOMY; Repair of Perforated Duodenal Ulcer;  Surgeon: Diamantina Monks, MD;  Location: [facility];  Service: General;  Laterality: N/A;   LYSIS OF ADHESION N/A 09/01/2020   Procedure: LYSIS OF ADHESION;  Surgeon: Diamantina Monks, MD;  Location: [facility] OR;  Service: General;  Laterality: N/A;    OB History   No obstetric history on file.      Home Medications    Prior to Admission medications   Medication Sig Start Date End Date Taking? Authorizing Provider  amoxicillin-clavulanate (AUGMENTIN) 875-125 MG tablet Take 1 tablet by mouth every 12 (twelve) hours. 03/03/23  Yes Behr Cislo, Denny Peon K, PA-C  apixaban (ELIQUIS) 2.5 MG TABS tablet Take 1 tablet (2.5 mg total) by mouth 2 (two) times daily. 08/29/20  Yes Leroy Sea, MD  benzonatate (TESSALON) 100 MG capsule Take 1 capsule (100 mg total) by mouth every 8 (eight) hours. 03/03/23  Yes Cash Meadow K, PA-C  blood glucose meter kit and supplies KIT Dispense based on patient  and insurance preference. Use up to four times daily as directed. (FOR ICD-9 250.00, 250.01). 12/21/20  Yes Russella Dar, NP  fluticasone (FLONASE) 50 MCG/ACT nasal spray Place 1 spray into both nostrils daily. 03/03/23  Yes Wilhelm Ganaway K, PA-C  gabapentin (NEURONTIN) 100 MG capsule TAKE 2 CAPSULES BY MOUTH EVERY DAY AT BEDTIME FOR  NERVE  PAIN 03/16/21  Yes [provider]  metoprolol tartrate 37.5 MG TABS Take 37.5 mg by mouth 2 (two) times daily. Take 1.5 tabs per dose 12/21/20  Yes Russella Dar, NP  oxyCODONE (OXY IR/ROXICODONE) 5 MG immediate release tablet Take 1-2 tablets (5-10 mg total)  by mouth every 4 (four) hours as needed for moderate pain. 12/21/20  Yes Russella Dar, NP  pantoprazole (PROTONIX) 40 MG tablet Take 1 tablet (40 mg total) by mouth daily. 12/22/20  Yes Russella Dar, NP  acetaminophen (TYLENOL) 500 MG tablet Take 1,000 mg by mouth every 6 (six) hours as needed for mild pain.    [provider]    Family History Family History  Problem Relation Age of Onset   Clotting disorder Mother        "died of a blood clot"   Alzheimer's disease Father     Social History Social History   Tobacco Use   Smoking status: Never   Smokeless tobacco: Never  Vaping Use   Vaping Use: Never used  Substance Use Topics   Alcohol use: Never   Drug use: Never     Allergies   Patient has no known allergies.   Review of Systems Review of Systems  Constitutional:  Positive for activity change and fever. Negative for appetite change and fatigue.  HENT:  Positive for congestion, postnasal drip and sinus pressure. Negative for sneezing and sore throat.   Respiratory:  Positive for cough. Negative for shortness of breath.   Cardiovascular:  Negative for chest pain.  Gastrointestinal:  Negative for abdominal pain, diarrhea, nausea and vomiting.  Neurological:  Negative for dizziness, light-headedness and headaches.     Physical Exam Triage Vital Signs ED Triage Vitals  Enc Vitals Group     BP 03/03/23 1619 (!) 168/103     Pulse Rate 03/03/23 1619 99     Resp 03/03/23 1619 20     Temp 03/03/23 1619 100.3 F (37.9 C)     Temp Source 03/03/23 1619 Oral     SpO2 03/03/23 1619 91 %     Weight 03/03/23 1616 200 lb (90.7 kg)     Height --      Head Circumference --      Peak Flow --      Pain Score 03/03/23 1616 0     Pain Loc --      Pain Edu? --      Excl. in GC? --    No data found.  Updated Vital Signs BP (!) 150/79 (BP Location: Left Arm) Comment (BP Location): large cuff  Pulse (!) 111   Temp 100.3 F (37.9 C) (Oral)   Resp (!) 24    Wt 200 lb (90.7 kg)   SpO2 93%   BMI 30.41 kg/m   Visual Acuity Right Eye Distance:   Left Eye Distance:   Bilateral Distance:    Right Eye Near:   Left Eye Near:    Bilateral Near:     Physical Exam Vitals reviewed.  Constitutional:      General: She is awake. She is not in acute  distress.    Appearance: Normal appearance. She is well-developed. She is not ill-appearing.     Comments: Very pleasant female appears stated age in no acute distress sitting comfortably in exam room  HENT:     Head: Normocephalic and atraumatic.     Right Ear: Tympanic membrane, ear canal and external ear normal. Tympanic membrane is not erythematous or bulging.     Left Ear: Tympanic membrane, ear canal and external ear normal. Tympanic membrane is not erythematous or bulging.     Nose:     Right Sinus: Maxillary sinus tenderness present. No frontal sinus tenderness.     Left Sinus: Maxillary sinus tenderness present. No frontal sinus tenderness.     Mouth/Throat:     Pharynx: Uvula midline. No oropharyngeal exudate or posterior oropharyngeal erythema.  Cardiovascular:     Rate and Rhythm: Normal rate and regular rhythm.     Heart sounds: Normal heart sounds, S1 normal and S2 normal. No murmur heard. Pulmonary:     Effort: Pulmonary effort is normal.     Breath sounds: Wheezing present. No rhonchi or rales.     Comments: Scattered wheezing Psychiatric:        Behavior: Behavior is cooperative.      UC Treatments / Results  Labs (all labs ordered are listed, but only abnormal results are displayed) Labs Reviewed  SARS CORONAVIRUS 2 (TAT 6-24 HRS)  POCT INFLUENZA A/B    EKG   Radiology DG Chest 2 View  Result Date: 03/03/2023 CLINICAL DATA:  Productive cough, rhinorrhea EXAM: CHEST - 2 VIEW COMPARISON:  12/08/2020 FINDINGS: Frontal and lateral views of the chest demonstrate a stable cardiac silhouette. Continued prominence of the pulmonary vascular consistent with pulmonary arterial  hypertension. Bibasilar scarring and fibrosis unchanged. No new airspace disease, effusion, or pneumothorax. No acute bony abnormalities. IMPRESSION: 1. Stable chronic areas of consolidation at the lung bases, favor scarring and fibrosis. No evidence of superimposed acute airspace disease. 2. Chronic prominence of the pulmonary vasculature suggesting pulmonary arterial hypertension. Electronically Signed   By: Sharlet Salina M.D.   On: 03/03/2023 17:21    Procedures Procedures (including critical care time)  Medications Ordered in UC Medications  albuterol (VENTOLIN HFA) 108 (90 Base) MCG/ACT inhaler 2 puff (2 puffs Inhalation Given 03/03/23 1651)  AeroChamber Plus Flo-Vu Large MISC 1 each (1 each Other Given 03/03/23 1652)    Initial Impression / Assessment and Plan / UC Course  I have reviewed the triage vital signs and the nursing notes.  Pertinent labs & imaging results that were available during my care of the patient were reviewed by me and considered in my medical decision making (see chart for details).     Patient had low-grade fever and initial oxygen saturation was 91%.  This improved to 93% following a dose of albuterol though she did become slightly tachycardic after the albuterol.  Chest x-ray was obtained given her history which showed chronic changes without acute superimposed pneumonia.  We discussed that given her symptoms only present for 5 days they are most likely viral in nature.  Influenza testing was obtained and was negative.  COVID testing is pending.  Unfortunately, by the time we received COVID test results that she will be outside the window of effectiveness for antiviral therapy.  Given her worsening of symptoms I am also concerned for secondary bacterial infection so we will give her Augmentin.  We discussed that she should start this medication only if her COVID test result  is negative.  She can use over-the-counter medication for symptom relief.  She was given albuterol  inhaler and spacer in clinic with some improvement of symptoms.  She was sent home with this medication to be used every 6 hours as needed.  She was also given Tessalon and Flonase to help with her congestion.  Discussed that she should be reevaluated within a few days and return if her symptoms are improving.  She has follow-up scheduled with her primary care next week.  She has a pulse oximeter at home and was encouraged to use this regularly to ensure that her oxygen saturation is not decreasing.  If it drops below 90% she is to go to the emergency room.  We discussed that if she has any worsening or changing symptoms including increasing cough, shortness of breath, fever not responding to medication, weakness she needs to go to the emergency room immediately.  Strict return precautions given to which she and daughter expressed understanding.  Final Clinical Impressions(s) / UC Diagnoses   Final diagnoses:  Sinobronchitis  Acute cough  Nasal congestion     Discharge Instructions      You tested negative for flu.  We will contact you if you are positive for COVID.  Please monitor your MyChart for these results.  X-ray was stable with no evidence of pneumonia.  I am glad that you are feeling better after the albuterol.  Use this every 6 hours as needed.  Only use this when you are feeling short of breath, wheezing, having coughing fits.  Start Augmentin if your COVID test is negative.  Use Tessalon for cough.  Use Flonase to help with your nasal congestion.  I also recommend nasal saline and sinus rinses.  Follow-up with your primary care as scheduled in a few days.  Use your pulse oximeter to monitor your oxygen saturation.  If this drops below 90% you need to go to the emergency room.  If anything worsens or changes and you have fever not responding to medication, chest pain, shortness of breath, worsening cough, weakness you should go to the emergency room.     ED Prescriptions      Medication Sig Dispense Auth. Provider   benzonatate (TESSALON) 100 MG capsule Take 1 capsule (100 mg total) by mouth every 8 (eight) hours. 21 capsule Alven Alverio K, PA-C   fluticasone (FLONASE) 50 MCG/ACT nasal spray Place 1 spray into both nostrils daily. 16 g Freddy Spadafora K, PA-C   amoxicillin-clavulanate (AUGMENTIN) 875-125 MG tablet Take 1 tablet by mouth every 12 (twelve) hours. 14 tablet Auriella Wieand, Noberto Retort, PA-C      PDMP not reviewed this encounter.   Jeani Hawking, PA-C 03/03/23 1745

## 2023-03-03 NOTE — ED Triage Notes (Signed)
Daughter states that pt has had a cough and coughing up mucus since Tuesday. Runny nose, unsure of fever. No headache or sore throat. Taking OTC cold meds

## 2023-03-03 NOTE — Discharge Instructions (Addendum)
You tested negative for flu.  We will contact you if you are positive for COVID.  Please monitor your MyChart for these results.  X-ray was stable with no evidence of pneumonia.  I am glad that you are feeling better after the albuterol.  Use this every 6 hours as needed.  Only use this when you are feeling short of breath, wheezing, having coughing fits.  Start Augmentin if your COVID test is negative.  Use Tessalon for cough.  Use Flonase to help with your nasal congestion.  I also recommend nasal saline and sinus rinses.  Follow-up with your primary care as scheduled in a few days.  Use your pulse oximeter to monitor your oxygen saturation.  If this drops below 90% you need to go to the emergency room.  If anything worsens or changes and you have fever not responding to medication, chest pain, shortness of breath, worsening cough, weakness you should go to the emergency room.

## 2023-03-04 LAB — SARS CORONAVIRUS 2 (TAT 6-24 HRS): SARS Coronavirus 2: NEGATIVE
